# Patient Record
Sex: Female | Born: 1975 | Race: White | Hispanic: No | Marital: Married
Health system: Southern US, Academic
[De-identification: ages and names within clinical notes are randomized; demographics above are authoritative.]

## PROBLEM LIST (undated history)

## (undated) ENCOUNTER — Encounter: Attending: Hematology | Primary: Hematology

## (undated) ENCOUNTER — Encounter: Attending: Nurse Practitioner | Primary: Nurse Practitioner

## (undated) ENCOUNTER — Other Ambulatory Visit

## (undated) ENCOUNTER — Ambulatory Visit

## (undated) ENCOUNTER — Encounter

## (undated) ENCOUNTER — Telehealth

## (undated) ENCOUNTER — Encounter: Attending: Adult Health | Primary: Adult Health

## (undated) ENCOUNTER — Encounter: Attending: Critical Care Medicine | Primary: Critical Care Medicine

## (undated) ENCOUNTER — Ambulatory Visit: Payer: PRIVATE HEALTH INSURANCE

## (undated) ENCOUNTER — Ambulatory Visit: Payer: PRIVATE HEALTH INSURANCE | Attending: Adult Health | Primary: Adult Health

## (undated) ENCOUNTER — Encounter
Attending: Student in an Organized Health Care Education/Training Program | Primary: Student in an Organized Health Care Education/Training Program

## (undated) ENCOUNTER — Ambulatory Visit
Payer: PRIVATE HEALTH INSURANCE | Attending: Student in an Organized Health Care Education/Training Program | Primary: Student in an Organized Health Care Education/Training Program

## (undated) ENCOUNTER — Encounter: Attending: Internal Medicine | Primary: Internal Medicine

## (undated) ENCOUNTER — Encounter: Attending: Pharmacist | Primary: Pharmacist

## (undated) ENCOUNTER — Encounter: Attending: Oncology | Primary: Oncology

## (undated) ENCOUNTER — Telehealth: Attending: Pharmacist | Primary: Pharmacist

## (undated) ENCOUNTER — Ambulatory Visit: Attending: Hematology | Primary: Hematology

## (undated) ENCOUNTER — Ambulatory Visit: Payer: PRIVATE HEALTH INSURANCE | Attending: Infectious Disease | Primary: Infectious Disease

## (undated) ENCOUNTER — Telehealth: Attending: Hematology | Primary: Hematology

## (undated) ENCOUNTER — Encounter: Attending: Registered" | Primary: Registered"

## (undated) ENCOUNTER — Encounter: Attending: Infectious Disease | Primary: Infectious Disease

## (undated) ENCOUNTER — Ambulatory Visit: Payer: Medicaid (Managed Care)

## (undated) ENCOUNTER — Ambulatory Visit: Payer: PRIVATE HEALTH INSURANCE | Attending: Hematology | Primary: Hematology

## (undated) ENCOUNTER — Telehealth
Attending: Pharmacist Clinician (PhC)/ Clinical Pharmacy Specialist | Primary: Pharmacist Clinician (PhC)/ Clinical Pharmacy Specialist

## (undated) ENCOUNTER — Telehealth: Attending: Adult Health | Primary: Adult Health

## (undated) ENCOUNTER — Encounter
Attending: Pharmacist Clinician (PhC)/ Clinical Pharmacy Specialist | Primary: Pharmacist Clinician (PhC)/ Clinical Pharmacy Specialist

## (undated) ENCOUNTER — Ambulatory Visit: Payer: PRIVATE HEALTH INSURANCE | Attending: Ambulatory Care | Primary: Ambulatory Care

## (undated) ENCOUNTER — Inpatient Hospital Stay

## (undated) ENCOUNTER — Telehealth: Attending: Nurse Practitioner | Primary: Nurse Practitioner

## (undated) ENCOUNTER — Encounter: Attending: Ophthalmology | Primary: Ophthalmology

## (undated) ENCOUNTER — Encounter: Attending: Primary Care | Primary: Primary Care

## (undated) DIAGNOSIS — C801 Malignant (primary) neoplasm, unspecified: Secondary | ICD-10-CM

## (undated) DIAGNOSIS — I10 Essential (primary) hypertension: Secondary | ICD-10-CM

## (undated) DIAGNOSIS — R51 Headache: Secondary | ICD-10-CM

## (undated) DIAGNOSIS — F32A Depression, unspecified: Secondary | ICD-10-CM

## (undated) DIAGNOSIS — F329 Major depressive disorder, single episode, unspecified: Secondary | ICD-10-CM

## (undated) DIAGNOSIS — F419 Anxiety disorder, unspecified: Secondary | ICD-10-CM

## (undated) DIAGNOSIS — N200 Calculus of kidney: Secondary | ICD-10-CM

## (undated) HISTORY — PX: LEEP: SHX91

## (undated) HISTORY — PX: KIDNEY STONE SURGERY: SHX686

---
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
## 2010-11-23 ENCOUNTER — Emergency Department (HOSPITAL_COMMUNITY)
Admission: EM | Admit: 2010-11-23 | Discharge: 2010-11-23 | Disposition: A | Discharge status: Home / Self Care | Payer: Self-pay | Attending: Emergency Medicine | Admitting: Emergency Medicine

## 2010-11-23 DIAGNOSIS — R630 Anorexia: Secondary | ICD-10-CM | POA: Insufficient documentation

## 2010-11-23 DIAGNOSIS — N200 Calculus of kidney: Secondary | ICD-10-CM | POA: Insufficient documentation

## 2010-11-23 DIAGNOSIS — R112 Nausea with vomiting, unspecified: Secondary | ICD-10-CM | POA: Insufficient documentation

## 2010-11-23 DIAGNOSIS — R109 Unspecified abdominal pain: Secondary | ICD-10-CM | POA: Insufficient documentation

## 2010-11-23 LAB — POCT I-STAT, CHEM 8
Creatinine, Ser: 1.1 mg/dL (ref 0.4–1.2)
Hemoglobin: 11.9 g/dL — ABNORMAL LOW (ref 12.0–15.0)
Potassium: 4 mEq/L (ref 3.5–5.1)
Sodium: 138 mEq/L (ref 135–145)
TCO2: 25 mmol/L (ref 0–100)

## 2010-11-23 LAB — URINALYSIS, ROUTINE W REFLEX MICROSCOPIC
Bilirubin Urine: NEGATIVE
Ketones, ur: NEGATIVE mg/dL
Nitrite: NEGATIVE
Urobilinogen, UA: 0.2 mg/dL (ref 0.0–1.0)
pH: 5.5 (ref 5.0–8.0)

## 2010-11-23 LAB — URINE MICROSCOPIC-ADD ON

## 2010-11-23 LAB — POCT PREGNANCY, URINE: Preg Test, Ur: NEGATIVE

## 2011-04-11 ENCOUNTER — Inpatient Hospital Stay (INDEPENDENT_AMBULATORY_CARE_PROVIDER_SITE_OTHER)
Admission: RE | Admit: 2011-04-11 | Discharge: 2011-04-11 | Disposition: A | Discharge status: Home / Self Care | Payer: Self-pay | Source: Ambulatory Visit | Attending: Family Medicine | Admitting: Family Medicine

## 2011-04-11 DIAGNOSIS — J019 Acute sinusitis, unspecified: Secondary | ICD-10-CM

## 2011-04-14 ENCOUNTER — Inpatient Hospital Stay (INDEPENDENT_AMBULATORY_CARE_PROVIDER_SITE_OTHER)
Admission: RE | Admit: 2011-04-14 | Discharge: 2011-04-14 | Disposition: A | Discharge status: Home / Self Care | Payer: Self-pay | Source: Ambulatory Visit | Attending: Family Medicine | Admitting: Family Medicine

## 2011-04-14 DIAGNOSIS — J019 Acute sinusitis, unspecified: Secondary | ICD-10-CM

## 2011-04-14 DIAGNOSIS — K2970 Gastritis, unspecified, without bleeding: Secondary | ICD-10-CM

## 2011-04-14 LAB — POCT URINALYSIS DIP (DEVICE)
Bilirubin Urine: NEGATIVE
Glucose, UA: NEGATIVE mg/dL
Ketones, ur: NEGATIVE mg/dL
Nitrite: NEGATIVE
pH: 6 (ref 5.0–8.0)

## 2011-04-14 LAB — POCT PREGNANCY, URINE: Preg Test, Ur: NEGATIVE

## 2011-06-04 ENCOUNTER — Inpatient Hospital Stay (INDEPENDENT_AMBULATORY_CARE_PROVIDER_SITE_OTHER)
Admission: RE | Admit: 2011-06-04 | Discharge: 2011-06-04 | Disposition: A | Discharge status: Home / Self Care | Payer: BC Managed Care – PPO | Source: Ambulatory Visit | Attending: Family Medicine | Admitting: Family Medicine

## 2011-06-04 DIAGNOSIS — J019 Acute sinusitis, unspecified: Secondary | ICD-10-CM

## 2011-06-04 DIAGNOSIS — J040 Acute laryngitis: Secondary | ICD-10-CM

## 2011-07-01 ENCOUNTER — Emergency Department (HOSPITAL_COMMUNITY): Admission: EM | Admit: 2011-07-01 | Discharge: 2011-07-01 | Payer: BC Managed Care – PPO | Source: Home / Self Care

## 2011-09-22 ENCOUNTER — Other Ambulatory Visit (HOSPITAL_COMMUNITY)
Admission: RE | Admit: 2011-09-22 | Discharge: 2011-09-22 | Disposition: A | Discharge status: Home / Self Care | Payer: BC Managed Care – PPO | Source: Ambulatory Visit | Attending: Obstetrics and Gynecology | Admitting: Obstetrics and Gynecology

## 2011-09-22 DIAGNOSIS — Z113 Encounter for screening for infections with a predominantly sexual mode of transmission: Secondary | ICD-10-CM | POA: Insufficient documentation

## 2011-09-22 DIAGNOSIS — Z1159 Encounter for screening for other viral diseases: Secondary | ICD-10-CM | POA: Insufficient documentation

## 2011-09-22 DIAGNOSIS — N760 Acute vaginitis: Secondary | ICD-10-CM | POA: Insufficient documentation

## 2011-09-22 DIAGNOSIS — Z01419 Encounter for gynecological examination (general) (routine) without abnormal findings: Secondary | ICD-10-CM | POA: Insufficient documentation

## 2011-11-09 ENCOUNTER — Emergency Department (HOSPITAL_COMMUNITY): Payer: BC Managed Care – PPO

## 2011-11-09 ENCOUNTER — Emergency Department (HOSPITAL_COMMUNITY)
Admission: EM | Admit: 2011-11-09 | Discharge: 2011-11-09 | Disposition: A | Discharge status: Home / Self Care | Payer: BC Managed Care – PPO | Attending: Emergency Medicine | Admitting: Emergency Medicine

## 2011-11-09 ENCOUNTER — Encounter (HOSPITAL_COMMUNITY): Payer: Self-pay | Admitting: *Deleted

## 2011-11-09 DIAGNOSIS — R109 Unspecified abdominal pain: Secondary | ICD-10-CM | POA: Insufficient documentation

## 2011-11-09 DIAGNOSIS — N200 Calculus of kidney: Secondary | ICD-10-CM | POA: Insufficient documentation

## 2011-11-09 HISTORY — DX: Calculus of kidney: N20.0

## 2011-11-09 LAB — POCT PREGNANCY, URINE: Preg Test, Ur: NEGATIVE

## 2011-11-09 LAB — URINALYSIS, ROUTINE W REFLEX MICROSCOPIC
Nitrite: NEGATIVE
Protein, ur: NEGATIVE mg/dL
Specific Gravity, Urine: 1.027 (ref 1.005–1.030)
Urobilinogen, UA: 0.2 mg/dL (ref 0.0–1.0)

## 2011-11-09 LAB — URINE MICROSCOPIC-ADD ON

## 2011-11-09 MED ORDER — KETOROLAC TROMETHAMINE 60 MG/2ML IM SOLN
60.0000 mg | Freq: Once | INTRAMUSCULAR | Status: AC
  Administered 2011-11-09: 60 mg via INTRAMUSCULAR
  Filled 2011-11-09: qty 2

## 2011-11-09 MED ORDER — OXYCODONE-ACETAMINOPHEN 5-325 MG PO TABS: 2.0000 | ORAL_TABLET | ORAL | Status: AC | PRN

## 2011-11-09 NOTE — Discharge Instructions (Signed)
You have a 9 mm right-sided kidney stone that needs to be followed up by the urologist within the next 24 hours. Call the office to schedule a followup visitKidney Stones Kidney stones (ureteral lithiasis) are deposits that form inside your kidneys. The intense pain is caused by the stone moving through the urinary tract. When the stone moves, the ureter goes into spasm around the stone. The stone is usually passed in the urine.  CAUSES   A disorder that makes certain neck glands produce too much parathyroid hormone (primary hyperparathyroidism).   A buildup of uric acid crystals.   Narrowing (stricture) of the ureter.   A kidney obstruction present at birth (congenital obstruction).   Previous surgery on the kidney or ureters.   Numerous kidney infections.  SYMPTOMS   Feeling sick to your stomach (nauseous).   Throwing up (vomiting).   Blood in the urine (hematuria).   Pain that usually spreads (radiates) to the groin.   Frequency or urgency of urination.  DIAGNOSIS   Taking a history and physical exam.   Blood or urine tests.   Computerized X-ray scan (CT scan).   Occasionally, an examination of the inside of the urinary bladder (cystoscopy) is performed.  TREATMENT   Observation.   Increasing your fluid intake.   Surgery may be needed if you have severe pain or persistent obstruction.  The size, location, and chemical composition are all important variables that will determine the proper choice of action for you. Talk to your caregiver to better understand your situation so that you will minimize the risk of injury to yourself and your kidney.  HOME CARE INSTRUCTIONS   Drink enough water and fluids to keep your urine clear or pale yellow.   Strain all urine through the provided strainer. Keep all particulate matter and stones for your caregiver to see. The stone causing the pain may be as small as a grain of salt. It is very important to use the strainer each and  every time you pass your urine. The collection of your stone will allow your caregiver to analyze it and verify that a stone has actually passed.   Only take over-the-counter or prescription medicines for pain, discomfort, or fever as directed by your caregiver.   Make a follow-up appointment with your caregiver as directed.   Get follow-up X-rays if required. The absence of pain does not always mean that the stone has passed. It may have only stopped moving. If the urine remains completely obstructed, it can cause loss of kidney function or even complete destruction of the kidney. It is your responsibility to make sure X-rays and follow-ups are completed. Ultrasounds of the kidney can show blockages and the status of the kidney. Ultrasounds are not associated with any radiation and can be performed easily in a matter of minutes.  SEEK IMMEDIATE MEDICAL CARE IF:   Pain cannot be controlled with the prescribed medicine.   You have a fever.   The severity or intensity of pain increases over 18 hours and is not relieved by pain medicine.   You develop a new onset of abdominal pain.   You feel faint or pass out.  MAKE SURE YOU:   Understand these instructions.   Will watch your condition.   Will get help right away if you are not doing well or get worse.  Document Released: 07/18/2005 Document Revised: 07/07/2011 Document Reviewed: 11/13/2009 [facility] Patient Information 2012 [place], [place].

## 2011-11-09 NOTE — ED Notes (Signed)
Patient transported to CT 

## 2011-11-09 NOTE — ED Notes (Signed)
Pain currently 0/10.

## 2011-11-09 NOTE — ED Notes (Signed)
Reports right flank pain, radiates around to RLQ and right groin area. Denies urinary symptoms. Hx of kidney stones.

## 2011-11-09 NOTE — ED Provider Notes (Signed)
History     CSN: [id number]  Arrival date & time 11/09/11  1251   First MD Initiated Contact with Patient 11/09/11 1432      Chief Complaint  Patient presents with  . Flank Pain    (Consider location/radiation/quality/duration/timing/severity/associated sxs/prior treatment) Patient is a 36 y.o. female presenting with flank pain. The history is provided by the patient.  Flank Pain   patient here with right-sided flank pain for one week has been colicky. History of kidney stones and this feels similar. No vaginal bleeding. No dysuria or hematuria. Been using over-the-counter medications without relief. Nothing makes her symptoms better or worse.  Past Medical History  Diagnosis Date  . Kidney stone     History reviewed. No pertinent past surgical history.  History reviewed. No pertinent family history.  History  Substance Use Topics  . Smoking status: Former Games developer  . Smokeless tobacco: Not on file  . Alcohol Use: Yes     occ    OB History    Grav Para Term Preterm Abortions TAB SAB Ect Mult Living                  Review of Systems  Genitourinary: Positive for flank pain.  All other systems reviewed and are negative.    Allergies  Sulfa antibiotics and Erythromycin  Home Medications   Current Outpatient Rx  Name Route Sig Dispense Refill  . LEVONORGEST-ETH ESTRAD 91-DAY 0.15-0.03 &0.01 MG PO TABS Oral Take 1 tablet by mouth at bedtime.    Marland Kitchen NAPROXEN SODIUM 550 MG PO TABS Oral Take 550 mg by mouth 2 (two) times daily between meals as needed.      BP 126/87  Pulse 80  Temp(Src) 98.8 F (37.1 C) (Oral)  Resp 18  SpO2 99%  LMP 10/21/2011  Physical Exam  Nursing note and vitals reviewed. Constitutional: She is oriented to person, place, and time. She appears well-developed and well-nourished.  Non-toxic appearance. No distress.  HENT:  Head: Normocephalic and atraumatic.  Eyes: Conjunctivae, EOM and lids are normal. Pupils are equal, round, and  reactive to light.  Neck: Normal range of motion. Neck supple. No tracheal deviation present. No mass present.  Cardiovascular: Normal rate, regular rhythm and normal heart sounds.  Exam reveals no gallop.   No murmur heard. Pulmonary/Chest: Effort normal and breath sounds normal. No stridor. No respiratory distress. She has no decreased breath sounds. She has no wheezes. She has no rhonchi. She has no rales.  Abdominal: Soft. Normal appearance and bowel sounds are normal. She exhibits no distension. There is no tenderness. There is CVA tenderness. There is no rigidity, no rebound and no guarding.  Musculoskeletal: Normal range of motion. She exhibits no edema and no tenderness.  Neurological: She is alert and oriented to person, place, and time. She has normal strength. No cranial nerve deficit or sensory deficit. GCS eye subscore is 4. GCS verbal subscore is 5. GCS motor subscore is 6.  Skin: Skin is warm and dry. No abrasion and no rash noted.  Psychiatric: She has a normal mood and affect. Her speech is normal and behavior is normal.    ED Course  Procedures (including critical care time)   Labs Reviewed  POCT PREGNANCY, URINE  URINALYSIS, ROUTINE W REFLEX MICROSCOPIC   No results found.   No diagnosis found.    MDM  Patient given Toradol here and feels much better. CT results given to patient and she was given a referral to the  urologist on call        Toy Baker, MD 11/09/11 1555

## 2011-11-11 ENCOUNTER — Other Ambulatory Visit: Payer: Self-pay | Admitting: Urology

## 2011-11-21 ENCOUNTER — Encounter (HOSPITAL_BASED_OUTPATIENT_CLINIC_OR_DEPARTMENT_OTHER): Payer: Self-pay | Admitting: *Deleted

## 2011-11-21 NOTE — Progress Notes (Signed)
To [facility] at 0800.Npo after mn-Hg,urine pregnancy on arrival.

## 2011-11-22 NOTE — H&P (Signed)
  H&P  Chief Complaint: kidney stone  History of Present Illness: Kathryn Black is a 36 y.o. year old female with the following history:   This 36 year old female comes in today for definitive management of a right distal ureteral stone pre-she has a history of several calculi, having been passed from the right side of the past several years. For a few weeks, she has had intermittent right flank pain as well as frequency and urgency as well as dysuria. Symptoms got worse couple of days ago and she had a CT scan which revealed an 8 mm right distal ureteral stone. She has no gross hematuria, she has no nausea or vomiting and no history of fever or chills. She has passed several stones before but has never seen a neurologist or had a 24-hour urine collection. She is having no left-sided symptoms.she is here today for ureteroscopic stone extraction.   Past Medical History  Diagnosis Date  . Kidney stone   . Headache   . Asthma     childhood-allergy induced  . Anxiety     no meds  . Depression     no meds    Past Surgical History  Procedure Date  . Leep     in office procedure  . Breast biopsy 1990's    in office procedure under local    Home Medications:  No prescriptions prior to admission    Allergies:  Allergies  Allergen Reactions  . Sulfa Antibiotics Anaphylaxis  . Erythromycin Hives    History reviewed. No pertinent family history.  Social History:  reports that she has quit smoking. She does not have any smokeless tobacco history on file. She reports that she drinks alcohol. She reports that she does not use illicit drugs.   Review of Systems Genitourinary, constitutional, skin, eye, otolaryngeal, hematologic/lymphatic, cardiovascular, pulmonary, endocrine, musculoskeletal, gastrointestinal, neurological and psychiatric system(s) were reviewed and pertinent findings if present are noted.  Genitourinary: urinary frequency, urinary urgency and dysuria, but no hematuria.    Gastrointestinal: abdominal pain and constipation.  Constitutional: night sweats and feeling tired (fatigue).  Eyes: blurred vision.  ENT: sore throat and sinus problems.  Cardiovascular: chest pain.  Respiratory: shortness of breath.  Musculoskeletal: back pain.  Neurological: dizziness.  Psychiatric: anxiety.      Physical Exam:  Vital signs in last 24 hours:   Constitutional: Well nourished and well developed . No acute distress.  ENT:. The ears and nose are normal in appearance.  Neck: The appearance of the neck is normal.  Pulmonary: No respiratory distress and normal respiratory rhythm and effort.  Skin: Normal skin turgor, no visible rash and no visible skin lesions.  Neuro/Psych:. Mood and affect are appropriate.      Laboratory Data:  No results found for this or any previous visit (from the past 24 hour(s)). No results found for this or any previous visit (from the past 240 hour(s)). Creatinine: No results found for this basename: CREATININE:7 in the last 168 hours  Radiologic Imaging: No results found.  Impression/Assessment:  7.7 mm right distal ureteral stone  Plan:  Cystoscopy, right ureteroscopy with holmium laser and extraction of right ureteral stone  Chelsea Aus 11/22/2011, 5:33 PM  Bertram Millard. Lean Jaeger MD

## 2011-11-23 ENCOUNTER — Encounter (HOSPITAL_BASED_OUTPATIENT_CLINIC_OR_DEPARTMENT_OTHER): Payer: Self-pay | Admitting: Certified Registered"

## 2011-11-23 ENCOUNTER — Encounter (HOSPITAL_BASED_OUTPATIENT_CLINIC_OR_DEPARTMENT_OTHER)
Admission: RE | Disposition: A | Discharge status: Home / Self Care | Payer: Self-pay | Source: Ambulatory Visit | Attending: Urology

## 2011-11-23 ENCOUNTER — Ambulatory Visit (HOSPITAL_BASED_OUTPATIENT_CLINIC_OR_DEPARTMENT_OTHER)
Admission: RE | Admit: 2011-11-23 | Discharge: 2011-11-23 | Disposition: A | Discharge status: Home / Self Care | Payer: BC Managed Care – PPO | Source: Ambulatory Visit | Attending: Urology | Admitting: Urology

## 2011-11-23 ENCOUNTER — Encounter (HOSPITAL_BASED_OUTPATIENT_CLINIC_OR_DEPARTMENT_OTHER): Payer: Self-pay | Admitting: *Deleted

## 2011-11-23 ENCOUNTER — Ambulatory Visit (HOSPITAL_BASED_OUTPATIENT_CLINIC_OR_DEPARTMENT_OTHER): Payer: BC Managed Care – PPO | Admitting: Certified Registered"

## 2011-11-23 DIAGNOSIS — N201 Calculus of ureter: Secondary | ICD-10-CM

## 2011-11-23 HISTORY — DX: Depression, unspecified: F32.A

## 2011-11-23 HISTORY — DX: Anxiety disorder, unspecified: F41.9

## 2011-11-23 HISTORY — DX: Headache: R51

## 2011-11-23 HISTORY — PX: CYSTOSCOPY WITH URETEROSCOPY: SHX5123

## 2011-11-23 HISTORY — DX: Major depressive disorder, single episode, unspecified: F32.9

## 2011-11-23 LAB — POCT PREGNANCY, URINE: Preg Test, Ur: NEGATIVE

## 2011-11-23 SURGERY — CYSTOSCOPY WITH URETEROSCOPY
Anesthesia: General | Site: Ureter | Laterality: Right | Wound class: Clean Contaminated

## 2011-11-23 MED ORDER — SODIUM CHLORIDE 0.9 % IR SOLN
Status: DC | PRN
  Administered 2011-11-23: 6000 mL

## 2011-11-23 MED ORDER — IOHEXOL 350 MG/ML SOLN
INTRAVENOUS | Status: DC | PRN
  Administered 2011-11-23: 50 mL via INTRAVENOUS

## 2011-11-23 MED ORDER — HYDROMORPHONE HCL PF 1 MG/ML IJ SOLN: 0.2500 mg | INTRAMUSCULAR | Status: DC | PRN

## 2011-11-23 MED ORDER — ACETAMINOPHEN 10 MG/ML IV SOLN
1000.0000 mg | Freq: Four times a day (QID) | INTRAVENOUS | Status: DC
  Administered 2011-11-23: 1000 mg via INTRAVENOUS

## 2011-11-23 MED ORDER — OXYBUTYNIN CHLORIDE 5 MG PO TABS: 5.0000 mg | ORAL_TABLET | Freq: Three times a day (TID) | ORAL | Status: DC

## 2011-11-23 MED ORDER — BELLADONNA ALKALOIDS-OPIUM 16.2-60 MG RE SUPP
RECTAL | Status: DC | PRN
  Administered 2011-11-23: 1 via RECTAL

## 2011-11-23 MED ORDER — FENTANYL CITRATE 0.05 MG/ML IJ SOLN
INTRAMUSCULAR | Status: DC | PRN
  Administered 2011-11-23: 100 ug via INTRAVENOUS

## 2011-11-23 MED ORDER — DEXAMETHASONE SODIUM PHOSPHATE 4 MG/ML IJ SOLN
INTRAMUSCULAR | Status: DC | PRN
  Administered 2011-11-23: 8 mg via INTRAVENOUS

## 2011-11-23 MED ORDER — OXYBUTYNIN CHLORIDE 5 MG PO TABS
5.0000 mg | ORAL_TABLET | Freq: Three times a day (TID) | ORAL | Status: DC
  Administered 2011-11-23: 5 mg via ORAL

## 2011-11-23 MED ORDER — CEFAZOLIN SODIUM 1-5 GM-% IV SOLN
1.0000 g | INTRAVENOUS | Status: AC
  Administered 2011-11-23: 1 g via INTRAVENOUS

## 2011-11-23 MED ORDER — LIDOCAINE HCL (CARDIAC) 20 MG/ML IV SOLN
INTRAVENOUS | Status: DC | PRN
  Administered 2011-11-23: 50 mg via INTRAVENOUS

## 2011-11-23 MED ORDER — MEPERIDINE HCL 25 MG/ML IJ SOLN: 6.2500 mg | INTRAMUSCULAR | Status: DC | PRN

## 2011-11-23 MED ORDER — LACTATED RINGERS IV SOLN: INTRAVENOUS | Status: DC

## 2011-11-23 MED ORDER — PROMETHAZINE HCL 25 MG/ML IJ SOLN: 6.2500 mg | INTRAMUSCULAR | Status: DC | PRN

## 2011-11-23 MED ORDER — LACTATED RINGERS IV SOLN
INTRAVENOUS | Status: DC
  Administered 2011-11-23: 08:00:00 via INTRAVENOUS

## 2011-11-23 MED ORDER — PROPOFOL 10 MG/ML IV EMUL
INTRAVENOUS | Status: DC | PRN
  Administered 2011-11-23: 200 mg via INTRAVENOUS

## 2011-11-23 MED ORDER — ONDANSETRON HCL 4 MG/2ML IJ SOLN
INTRAMUSCULAR | Status: DC | PRN
  Administered 2011-11-23: 4 mg via INTRAVENOUS

## 2011-11-23 MED ORDER — CIPROFLOXACIN HCL 250 MG PO TABS: 250.0000 mg | ORAL_TABLET | Freq: Two times a day (BID) | ORAL | Status: AC

## 2011-11-23 MED ORDER — MIDAZOLAM HCL 5 MG/5ML IJ SOLN
INTRAMUSCULAR | Status: DC | PRN
  Administered 2011-11-23: 2 mg via INTRAVENOUS

## 2011-11-23 SURGICAL SUPPLY — 20 items
ADAPTER CATH URET PLST 4-6FR (CATHETERS) IMPLANT
BAG DRAIN URO-CYSTO SKYTR STRL (DRAIN) ×3 IMPLANT
BASKET ZERO TIP NITINOL 2.4FR (BASKET) ×3 IMPLANT
CANISTER SUCT LVC 12 LTR MEDI- (MISCELLANEOUS) ×3 IMPLANT
CATH INTERMIT  6FR 70CM (CATHETERS) IMPLANT
CLOTH BEACON ORANGE TIMEOUT ST (SAFETY) ×3 IMPLANT
DRAPE CAMERA CLOSED 9X96 (DRAPES) ×3 IMPLANT
GLOVE BIO SURGEON STRL SZ8 (GLOVE) ×3 IMPLANT
GLOVE INDICATOR 6.5 STRL GRN (GLOVE) ×6 IMPLANT
GOWN STRL REIN XL XLG (GOWN DISPOSABLE) ×3 IMPLANT
GOWN SURGICAL LARGE (GOWNS) ×3 IMPLANT
GOWN XL W/COTTON TOWEL STD (GOWNS) ×3 IMPLANT
GUIDEWIRE 0.038 PTFE COATED (WIRE) IMPLANT
GUIDEWIRE ANG ZIPWIRE 038X150 (WIRE) IMPLANT
GUIDEWIRE STR DUAL SENSOR (WIRE) ×3 IMPLANT
IV NS IRRIG 3000ML ARTHROMATIC (IV SOLUTION) ×6 IMPLANT
LASER FIBER DISP (UROLOGICAL SUPPLIES) ×3 IMPLANT
NS IRRIG 500ML POUR BTL (IV SOLUTION) IMPLANT
PACK CYSTOSCOPY (CUSTOM PROCEDURE TRAY) ×3 IMPLANT
SYRINGE IRR TOOMEY STRL 70CC (SYRINGE) ×3 IMPLANT

## 2011-11-23 NOTE — Anesthesia Preprocedure Evaluation (Signed)
Anesthesia Evaluation  Patient identified by MRN, date of birth, ID band Patient awake    Reviewed: Allergy & Precautions, H&P , NPO status , Patient's Chart, lab work & pertinent test results  Airway Mallampati: II TM Distance: >3 FB Neck ROM: Full    Dental No notable dental hx.    Pulmonary neg pulmonary ROS, asthma ,  breath sounds clear to auscultation  Pulmonary exam normal       Cardiovascular negative cardio ROS  Rhythm:Regular Rate:Normal     Neuro/Psych negative neurological ROS  negative psych ROS   GI/Hepatic negative GI ROS, Neg liver ROS,   Endo/Other  negative endocrine ROS  Renal/GU negative Renal ROS  negative genitourinary   Musculoskeletal negative musculoskeletal ROS (+)   Abdominal   Peds negative pediatric ROS (+)  Hematology negative hematology ROS (+)   Anesthesia Other Findings   Reproductive/Obstetrics negative OB ROS                           Anesthesia Physical Anesthesia Plan  ASA: II  Anesthesia Plan: General   Post-op Pain Management:    Induction: Intravenous  Airway Management Planned: LMA  Additional Equipment:   Intra-op Plan:   Post-operative Plan:   Informed Consent: I have reviewed the patients History and Physical, chart, labs and discussed the procedure including the risks, benefits and alternatives for the proposed anesthesia with the patient or authorized representative who has indicated his/her understanding and acceptance.   Dental advisory given  Plan Discussed with: CRNA  Anesthesia Plan Comments:         Anesthesia Quick Evaluation

## 2011-11-23 NOTE — Op Note (Signed)
Preoperative diagnosis: 7 mm right distal ureteral stone  Postoperative diagnosis: Same  Principal procedure: Cystoscopy, right ureteroscopy with holmium laser and extraction of stone  Surgeon: Junell Cullifer  Anesthesia: Gen. with LMA  Complications: None  Drains: None  Specimen: Stone fragments, the family  Indications for procedure: 36 year old female with a symptomatic, persistent right distal ureteral stone, approximately 7-8 mm in size. More than likely, this has been there for months. At this point, she presents for management. I discussed treatment options with her, and have recommended a more direct route with ureteroscopy, due to her age and the location of the stone. Risks and complications have been discussed with the patient who understands these and desires to proceed.  Description of procedure: The patient was identified and properly marked in the holding area, and received preoperative IV antibiotics. She was taken to the operating room where general anesthetic was administered using the LMA. She was placed in the dorsolithotomy position. Genitalia and perineum were prepped and draped. Time out was then performed.  A 22 French panendoscope was advanced into her bladder which was normal except for some erythema around the right ureteral orifice, which was indicative of a indwelling stone. A guidewire was placed proximally in the right ureter using fluoroscopic guidance. At this point, a 6 [place] short ureteroscope was advanced into the right distal ureter, where the stone was encountered. It was fragmented into multiple small fragments using the 365  fiber and the holmium laser. Small fragments were then removed and placed in the bladder. Further inspection of the ureter with the ureteroscope revealed no further stones, no significant ureteral trauma. I did not leave a stent. The bladder was irrigated and the stone fragments removed. The bladder was then drained, the scope removed, and  the procedure terminated. She received a B&O suppository prior to procedure. She tolerated the procedure well and was taken to the PACU in stable condition.

## 2011-11-23 NOTE — Transfer of Care (Signed)
Immediate Anesthesia Transfer of Care Note  Patient: Kathryn Black  Procedure(s) Performed: Procedure(s) (LRB): CYSTOSCOPY WITH URETEROSCOPY (Right) HOLMIUM LASER APPLICATION (Right)  Patient Location: PACU  Anesthesia Type: General  Level of Consciousness: sedated  Airway & Oxygen Therapy: Patient Spontanous Breathing and Patient connected to face mask oxygen  Post-op Assessment: Report given to PACU RN and Post -op Vital signs reviewed and stable  Post vital signs: Reviewed and stable  Complications: No apparent anesthesia complications

## 2011-11-23 NOTE — Discharge Instructions (Signed)
POSTOPERATIVE CARE AFTER URETEROSCOPY ° ° ° °Diet ° °Once you have adequately recovered from anesthesia, you may gradually advance your diet, as tolerated, to your regular diet. ° °Activities ° °You may gradually increase your activities to your normal unrestricted level the day following your procedure. ° °Medications ° °You should resume all preoperative medications. If you are on aspirin-like compounds, you should not resume these until the blood clears from your urine. If given an antibiotic by the surgeon, take these until they are completed. You may also be given, if you have a stent, medications to decrease the urinary frequency and urgency. ° °Pain ° °After ureteroscopy, there may be some pain on the side of the scope. Take your pain medicine for this. Usually, this pain resolves within a day or 2. ° °Fever ° °Please report any fever over 100° to the doctor. ° °Post Anesthesia Home Care Instructions ° °Activity: °Get plenty of rest for the remainder of the day. A responsible adult should stay with you for 24 hours following the procedure.  °For the next 24 hours, DO NOT: °-Drive a car °-Operate machinery °-Drink alcoholic beverages °-Take any medication unless instructed by your physician °-Make any legal decisions or sign important papers. ° °Meals: °Start with liquid foods such as gelatin or soup. Progress to regular foods as tolerated. Avoid greasy, spicy, heavy foods. If nausea and/or vomiting occur, drink only clear liquids until the nausea and/or vomiting subsides. Call your physician if vomiting continues. ° °Special Instructions/Symptoms: °Your throat may feel dry or sore from the anesthesia or the breathing tube placed in your throat during surgery. If this causes discomfort, gargle with warm salt water. The discomfort should disappear within 24 hours. ° °

## 2011-11-23 NOTE — Anesthesia Postprocedure Evaluation (Signed)
  Anesthesia Post-op Note  Patient: Kathryn Black  Procedure(s) Performed: Procedure(s) (LRB): CYSTOSCOPY WITH URETEROSCOPY (Right) HOLMIUM LASER APPLICATION (Right)  Patient Location: PACU  Anesthesia Type: General  Level of Consciousness: awake and alert   Airway and Oxygen Therapy: Patient Spontanous Breathing  Post-op Pain: mild  Post-op Assessment: Post-op Vital signs reviewed, Patient's Cardiovascular Status Stable, Respiratory Function Stable, Patent Airway and No signs of Nausea or vomiting  Post-op Vital Signs: stable  Complications: No apparent anesthesia complications

## 2011-11-24 ENCOUNTER — Encounter (HOSPITAL_BASED_OUTPATIENT_CLINIC_OR_DEPARTMENT_OTHER): Payer: Self-pay | Admitting: Urology

## 2011-11-28 ENCOUNTER — Encounter (HOSPITAL_BASED_OUTPATIENT_CLINIC_OR_DEPARTMENT_OTHER): Payer: Self-pay

## 2013-01-24 ENCOUNTER — Other Ambulatory Visit (HOSPITAL_COMMUNITY)
Admission: RE | Admit: 2013-01-24 | Discharge: 2013-01-24 | Disposition: A | Discharge status: Home / Self Care | Payer: BC Managed Care – PPO | Source: Ambulatory Visit | Attending: Obstetrics and Gynecology | Admitting: Obstetrics and Gynecology

## 2013-01-24 ENCOUNTER — Other Ambulatory Visit: Payer: Self-pay | Admitting: Obstetrics and Gynecology

## 2013-01-24 DIAGNOSIS — N760 Acute vaginitis: Secondary | ICD-10-CM | POA: Insufficient documentation

## 2013-01-24 DIAGNOSIS — Z01419 Encounter for gynecological examination (general) (routine) without abnormal findings: Secondary | ICD-10-CM | POA: Insufficient documentation

## 2013-01-24 DIAGNOSIS — Z1151 Encounter for screening for human papillomavirus (HPV): Secondary | ICD-10-CM | POA: Insufficient documentation

## 2016-01-21 ENCOUNTER — Emergency Department (HOSPITAL_COMMUNITY): Payer: 59

## 2016-01-21 ENCOUNTER — Emergency Department (HOSPITAL_COMMUNITY)
Admission: EM | Admit: 2016-01-21 | Discharge: 2016-01-21 | Disposition: A | Discharge status: Home / Self Care | Payer: 59 | Attending: Emergency Medicine | Admitting: Emergency Medicine

## 2016-01-21 ENCOUNTER — Encounter (HOSPITAL_COMMUNITY): Payer: Self-pay

## 2016-01-21 DIAGNOSIS — J45909 Unspecified asthma, uncomplicated: Secondary | ICD-10-CM | POA: Diagnosis not present

## 2016-01-21 DIAGNOSIS — Z87891 Personal history of nicotine dependence: Secondary | ICD-10-CM | POA: Insufficient documentation

## 2016-01-21 DIAGNOSIS — R1031 Right lower quadrant pain: Secondary | ICD-10-CM | POA: Diagnosis present

## 2016-01-21 DIAGNOSIS — N1330 Unspecified hydronephrosis: Secondary | ICD-10-CM | POA: Insufficient documentation

## 2016-01-21 DIAGNOSIS — N201 Calculus of ureter: Secondary | ICD-10-CM | POA: Insufficient documentation

## 2016-01-21 DIAGNOSIS — N23 Unspecified renal colic: Secondary | ICD-10-CM

## 2016-01-21 DIAGNOSIS — F329 Major depressive disorder, single episode, unspecified: Secondary | ICD-10-CM | POA: Insufficient documentation

## 2016-01-21 LAB — URINALYSIS, ROUTINE W REFLEX MICROSCOPIC
BILIRUBIN URINE: NEGATIVE
Glucose, UA: NEGATIVE mg/dL
HGB URINE DIPSTICK: NEGATIVE
Ketones, ur: NEGATIVE mg/dL
Leukocytes, UA: NEGATIVE
NITRITE: NEGATIVE
PROTEIN: NEGATIVE mg/dL
SPECIFIC GRAVITY, URINE: 1.016 (ref 1.005–1.030)
pH: 8 (ref 5.0–8.0)

## 2016-01-21 LAB — CBC
HCT: 43.1 % (ref 36.0–46.0)
Hemoglobin: 15.7 g/dL — ABNORMAL HIGH (ref 12.0–15.0)
MCH: 32.8 pg (ref 26.0–34.0)
MCHC: 36.4 g/dL — AB (ref 30.0–36.0)
MCV: 90.2 fL (ref 78.0–100.0)
PLATELETS: 317 10*3/uL (ref 150–400)
RBC: 4.78 MIL/uL (ref 3.87–5.11)
RDW: 12.1 % (ref 11.5–15.5)
WBC: 11.7 10*3/uL — AB (ref 4.0–10.5)

## 2016-01-21 LAB — COMPREHENSIVE METABOLIC PANEL
ALT: 55 U/L — AB (ref 14–54)
AST: 33 U/L (ref 15–41)
Albumin: 4.6 g/dL (ref 3.5–5.0)
Alkaline Phosphatase: 59 U/L (ref 38–126)
Anion gap: 11 (ref 5–15)
BILIRUBIN TOTAL: 0.8 mg/dL (ref 0.3–1.2)
BUN: 19 mg/dL (ref 6–20)
CO2: 24 mmol/L (ref 22–32)
CREATININE: 0.82 mg/dL (ref 0.44–1.00)
Calcium: 9.5 mg/dL (ref 8.9–10.3)
Chloride: 101 mmol/L (ref 101–111)
GFR calc Af Amer: >60 mL/min (ref >60)
Glucose, Bld: 134 mg/dL — ABNORMAL HIGH (ref 65–99)
Potassium: 3.6 mmol/L (ref 3.5–5.1)
Sodium: 136 mmol/L (ref 135–145)
TOTAL PROTEIN: 8 g/dL (ref 6.5–8.1)

## 2016-01-21 LAB — PREGNANCY, URINE: PREG TEST UR: NEGATIVE

## 2016-01-21 LAB — LIPASE, BLOOD: Lipase: 81 U/L — ABNORMAL HIGH (ref 11–51)

## 2016-01-21 MED ORDER — ONDANSETRON 8 MG PO TBDP: 8.0000 mg | ORAL_TABLET | Freq: Three times a day (TID) | ORAL | Status: DC | PRN

## 2016-01-21 MED ORDER — HYDROMORPHONE HCL 1 MG/ML IJ SOLN
1.0000 mg | Freq: Once | INTRAMUSCULAR | Status: AC
  Administered 2016-01-21: 1 mg via INTRAVENOUS
  Filled 2016-01-21: qty 1

## 2016-01-21 MED ORDER — KETOROLAC TROMETHAMINE 15 MG/ML IJ SOLN
15.0000 mg | Freq: Once | INTRAMUSCULAR | Status: AC
  Administered 2016-01-21: 15 mg via INTRAVENOUS
  Filled 2016-01-21: qty 1

## 2016-01-21 MED ORDER — IOPAMIDOL (ISOVUE-300) INJECTION 61%
100.0000 mL | Freq: Once | INTRAVENOUS | Status: AC | PRN
  Administered 2016-01-21: 100 mL via INTRAVENOUS

## 2016-01-21 MED ORDER — IBUPROFEN 400 MG PO TABS: 400.0000 mg | ORAL_TABLET | Freq: Four times a day (QID) | ORAL | Status: DC | PRN

## 2016-01-21 MED ORDER — ONDANSETRON HCL 4 MG/2ML IJ SOLN
4.0000 mg | Freq: Once | INTRAMUSCULAR | Status: AC
  Administered 2016-01-21: 4 mg via INTRAVENOUS
  Filled 2016-01-21: qty 2

## 2016-01-21 MED ORDER — HYDROCODONE-ACETAMINOPHEN 5-325 MG PO TABS: 1.0000 | ORAL_TABLET | Freq: Four times a day (QID) | ORAL | Status: DC | PRN

## 2016-01-21 NOTE — ED Provider Notes (Addendum)
CSN: [id number]     Arrival date & time 01/21/16  [id number] History   First MD Initiated Contact with Patient 01/21/16 [phone]     Chief Complaint  Patient presents with  . Abdominal Pain     (Consider location/radiation/quality/duration/timing/severity/associated sxs/prior Treatment) HPI Comments: Pt comes in with RLQ abd pain x 2 hours. Pt has hx of renal stones x 2, one of which required intervention. She reports pain in the R side that has been severe since middle of the night. The pain is constant and severe, sharp pain. Pain is non radiating. She has nausea and emesis. No diarrhea. No uti like symptoms. Pain is different than her renal stones. Labs show elevated lipase. Pt denies any alcohol abuse or gall stone hx.   ROS 10 Systems reviewed and are negative for acute change except as noted in the HPI.     Patient is a 40 y.o. female presenting with abdominal pain. The history is provided by the patient.  Abdominal Pain   Past Medical History  Diagnosis Date  . Kidney stone   . Headache(784.0)   . Asthma     childhood-allergy induced  . Anxiety     no meds  . Depression     no meds   Past Surgical History  Procedure Laterality Date  . Leep      in office procedure  . Breast biopsy  1990's    in office procedure under local  . Cystoscopy with ureteroscopy  11/23/2011    Procedure: CYSTOSCOPY WITH URETEROSCOPY;  Surgeon: Franchot Gallo, MD;  Location: [facility];  Service: Urology;  Laterality: Right;  WITH STONE obtained   History reviewed. No pertinent family history. Social History  Substance Use Topics  . Smoking status: Former Smoker -- 0.25 packs/day  . Smokeless tobacco: None  . Alcohol Use: Yes     Comment: occ   OB History    No data available     Review of Systems  Gastrointestinal: Positive for abdominal pain.      Allergies  Sulfa antibiotics and Erythromycin  Home Medications   Prior to Admission medications   Medication  Sig Start Date End Date Taking? Authorizing Provider  escitalopram (LEXAPRO) 10 MG tablet Take 10 mg by mouth daily.   Yes Historical Provider, MD  naproxen sodium (ANAPROX) 550 MG tablet Take 550 mg by mouth 2 (two) times daily between meals as needed.   Yes Historical Provider, MD  HYDROcodone-acetaminophen (NORCO/VICODIN) 5-325 MG tablet Take 1 tablet by mouth every 6 (six) hours as needed. 01/21/16   Varney Biles, MD  ibuprofen (ADVIL,MOTRIN) 400 MG tablet Take 1 tablet (400 mg total) by mouth every 6 (six) hours as needed. 01/21/16   Varney Biles, MD  ondansetron (ZOFRAN ODT) 8 MG disintegrating tablet Take 1 tablet (8 mg total) by mouth every 8 (eight) hours as needed for nausea. 01/21/16   Varney Biles, MD  oxybutynin (DITROPAN) 5 MG tablet Take 1 tablet (5 mg total) by mouth 3 (three) times daily. 11/23/11 11/22/12  Franchot Gallo, MD   BP 156/103 mmHg  Pulse 77  Temp(Src) 97.9 F (36.6 C) (Oral)  Resp 20  SpO2 95%  LMP 01/07/2016 Physical Exam  Constitutional: She is oriented to person, place, and time. She appears well-developed.  HENT:  Head: Normocephalic and atraumatic.  Eyes: EOM are normal.  Neck: Normal range of motion. Neck supple.  Cardiovascular: Normal rate.   Pulmonary/Chest: Effort normal.  Abdominal: Bowel sounds are normal.  There is tenderness. There is guarding.  RLQ and RUQ tenderness  Neurological: She is alert and oriented to person, place, and time.  Skin: Skin is warm and dry.  Nursing note and vitals reviewed.   ED Course  Procedures (including critical care time) Labs Review Labs Reviewed  LIPASE, BLOOD - Abnormal; Notable for the following:    Lipase 81 (*)    All other components within normal limits  COMPREHENSIVE METABOLIC PANEL - Abnormal; Notable for the following:    Glucose, Bld 134 (*)    ALT 55 (*)    All other components within normal limits  CBC - Abnormal; Notable for the following:    WBC 11.7 (*)    Hemoglobin 15.7 (*)     MCHC 36.4 (*)    All other components within normal limits  URINALYSIS, ROUTINE W REFLEX MICROSCOPIC (NOT AT [facility]) - Abnormal; Notable for the following:    APPearance CLOUDY (*)    All other components within normal limits  PREGNANCY, URINE    Imaging Review Ct Abdomen Pelvis W Contrast  01/21/2016  CLINICAL DATA:  40 year old female with right lower quadrant abdominal pain. EXAM: CT ABDOMEN AND PELVIS WITH CONTRAST TECHNIQUE: Multidetector CT imaging of the abdomen and pelvis was performed using the standard protocol following bolus administration of intravenous contrast. CONTRAST:  152mL ISOVUE-300 IOPAMIDOL (ISOVUE-300) INJECTION 61% COMPARISON:  CT dated 11/09/2011 FINDINGS: The visualized lung bases are clear. No intra-abdominal free air or free fluid. The liver, gallbladder, pancreas, spleen, adrenal glands appear unremarkable. There is a 7 mm right ureterovesical junction calculus with mild right hydronephroureter. There is small right perinephric fluid. Correlation with urinalysis recommended to exclude superimposed UTI. Multiple punctate nonobstructing left renal calculi noted. There is no hydronephrosis on the left. The urinary bladder is unremarkable. The uterus is anteverted and grossly unremarkable. The visualized ovaries are grossly unremarkable. Evaluation of the bowel is limited in the absence of oral contrast. There is no evidence of bowel obstruction or active inflammation. There is apparent diffuse thickening of the colon, likely related to underdistention. Normal appendix. The abdominal aorta and IVC appear unremarkable. No portal venous gas identified. There is no adenopathy. Small fat containing umbilical hernia. The abdominal wall soft tissues appear unremarkable. The osseous structures are intact. IMPRESSION: A 7 mm right UVJ stone with mild right hydronephrosis. Correlation with urinalysis recommended to exclude superimposed UTI. Punctate nonobstructing left renal calculi.  Electronically Signed   By: Anner Crete M.D.   On: 01/21/2016 06:26   I have personally reviewed and evaluated these images and lab results as part of my medical decision-making.   EKG Interpretation None      MDM   Final diagnoses:  Ureteral colic    Pt comes in with cc of R sided pain. Lipase is elevated. DDX Renal stone Appendicitis Ectopic preg Cholecystitis Perforated viscus  CT is showing ureteral colic. Pain improved post 1st round of medicine, but it is returning again. Will give toradol.   Varney Biles, MD 01/21/16 0651  7:23 AM Pain is now 2/10, tolerable, she is smiling. Results discussed. Return precautions discussed. Stone is pretty large, so i wouldn't be surprised if she has to return for pain.  Varney Biles, MD 01/21/16 [phone]

## 2016-01-21 NOTE — Discharge Instructions (Signed)
We saw you in the ER for the abdominal pain. Our results indicate that you have a kidney stone. We were able to get your pain is relative control, and we can safely send you home.  Take the meds prescribed. Set up an appointment with the Urologist. If the pain is unbearable, you start having fevers, chills, and are unable to keep any meds down - then return to the ER.    Kidney Stones Kidney stones (urolithiasis) are deposits that form inside your kidneys. The intense pain is caused by the stone moving through the urinary tract. When the stone moves, the ureter goes into spasm around the stone. The stone is usually passed in the urine.  CAUSES   A disorder that makes certain neck glands produce too much parathyroid hormone (primary hyperparathyroidism).  A buildup of uric acid crystals, similar to gout in your joints.  Narrowing (stricture) of the ureter.  A kidney obstruction present at birth (congenital obstruction).  Previous surgery on the kidney or ureters.  Numerous kidney infections. SYMPTOMS   Feeling sick to your stomach (nauseous).  Throwing up (vomiting).  Blood in the urine (hematuria).  Pain that usually spreads (radiates) to the groin.  Frequency or urgency of urination. DIAGNOSIS   Taking a history and physical exam.  Blood or urine tests.  CT scan.  Occasionally, an examination of the inside of the urinary bladder (cystoscopy) is performed. TREATMENT   Observation.  Increasing your fluid intake.  Extracorporeal shock wave lithotripsy--This is a noninvasive procedure that uses shock waves to break up kidney stones.  Surgery may be needed if you have severe pain or persistent obstruction. There are various surgical procedures. Most of the procedures are performed with the use of small instruments. Only small incisions are needed to accommodate these instruments, so recovery time is minimized. The size, location, and chemical composition are all  important variables that will determine the proper choice of action for you. Talk to your health care provider to better understand your situation so that you will minimize the risk of injury to yourself and your kidney.  HOME CARE INSTRUCTIONS   Drink enough water and fluids to keep your urine clear or pale yellow. This will help you to pass the stone or stone fragments.  Strain all urine through the provided strainer. Keep all particulate matter and stones for your health care provider to see. The stone causing the pain may be as small as a grain of salt. It is very important to use the strainer each and every time you pass your urine. The collection of your stone will allow your health care provider to analyze it and verify that a stone has actually passed. The stone analysis will often identify what you can do to reduce the incidence of recurrences.  Only take over-the-counter or prescription medicines for pain, discomfort, or fever as directed by your health care provider.  Keep all follow-up visits as told by your health care provider. This is important.  Get follow-up X-rays if required. The absence of pain does not always mean that the stone has passed. It may have only stopped moving. If the urine remains completely obstructed, it can cause loss of kidney function or even complete destruction of the kidney. It is your responsibility to make sure X-rays and follow-ups are completed. Ultrasounds of the kidney can show blockages and the status of the kidney. Ultrasounds are not associated with any radiation and can be performed easily in a matter of minutes.  Make changes to your daily diet as told by your health care provider. You may be told to:  Limit the amount of salt that you eat.  Eat 5 or more servings of fruits and vegetables each day.  Limit the amount of meat, poultry, fish, and eggs that you eat.  Collect a 24-hour urine sample as told by your health care provider.You may need  to collect another urine sample every 6-12 months. SEEK MEDICAL CARE IF:  You experience pain that is progressive and unresponsive to any pain medicine you have been prescribed. SEEK IMMEDIATE MEDICAL CARE IF:   Pain cannot be controlled with the prescribed medicine.  You have a fever or shaking chills.  The severity or intensity of pain increases over 18 hours and is not relieved by pain medicine.  You develop a new onset of abdominal pain.  You feel faint or pass out.  You are unable to urinate.   This information is not intended to replace advice given to you by your health care provider. Make sure you discuss any questions you have with your health care provider.   Document Released: 07/18/2005 Document Revised: 04/08/2015 Document Reviewed: 12/19/2012 Elsevier Interactive Patient Education [employer].

## 2016-01-21 NOTE — ED Notes (Signed)
Pt complains of right lower quad pain for two hours

## 2016-11-18 ENCOUNTER — Other Ambulatory Visit (INDEPENDENT_AMBULATORY_CARE_PROVIDER_SITE_OTHER): Payer: Self-pay | Admitting: Otolaryngology

## 2016-11-18 DIAGNOSIS — J329 Chronic sinusitis, unspecified: Secondary | ICD-10-CM

## 2016-12-06 ENCOUNTER — Other Ambulatory Visit: Payer: BC Managed Care – PPO

## 2017-07-07 ENCOUNTER — Emergency Department (HOSPITAL_COMMUNITY): Payer: BC Managed Care – PPO

## 2017-07-07 ENCOUNTER — Encounter (HOSPITAL_COMMUNITY): Payer: Self-pay

## 2017-07-07 ENCOUNTER — Other Ambulatory Visit: Payer: Self-pay

## 2017-07-07 ENCOUNTER — Emergency Department (HOSPITAL_COMMUNITY)
Admission: EM | Admit: 2017-07-07 | Discharge: 2017-07-08 | Disposition: A | Discharge status: Home / Self Care | Payer: BC Managed Care – PPO | Attending: Emergency Medicine | Admitting: Emergency Medicine

## 2017-07-07 DIAGNOSIS — R0981 Nasal congestion: Secondary | ICD-10-CM | POA: Diagnosis not present

## 2017-07-07 DIAGNOSIS — J45909 Unspecified asthma, uncomplicated: Secondary | ICD-10-CM | POA: Diagnosis not present

## 2017-07-07 DIAGNOSIS — R51 Headache: Secondary | ICD-10-CM | POA: Diagnosis not present

## 2017-07-07 DIAGNOSIS — Z79899 Other long term (current) drug therapy: Secondary | ICD-10-CM | POA: Diagnosis not present

## 2017-07-07 DIAGNOSIS — R42 Dizziness and giddiness: Secondary | ICD-10-CM | POA: Diagnosis present

## 2017-07-07 MED ORDER — MECLIZINE HCL 25 MG PO TABS
50.0000 mg | ORAL_TABLET | Freq: Once | ORAL | Status: AC
  Administered 2017-07-07: 25 mg via ORAL
  Filled 2017-07-07: qty 2

## 2017-07-07 MED ORDER — METOCLOPRAMIDE HCL 5 MG/ML IJ SOLN
10.0000 mg | Freq: Once | INTRAMUSCULAR | Status: AC
  Administered 2017-07-07: 10 mg via INTRAVENOUS
  Filled 2017-07-07: qty 2

## 2017-07-07 MED ORDER — LACTATED RINGERS IV BOLUS (SEPSIS)
1000.0000 mL | Freq: Once | INTRAVENOUS | Status: AC
  Administered 2017-07-07: 1000 mL via INTRAVENOUS

## 2017-07-07 MED ORDER — DIPHENHYDRAMINE HCL 50 MG/ML IJ SOLN
25.0000 mg | Freq: Once | INTRAMUSCULAR | Status: AC
  Administered 2017-07-07: 25 mg via INTRAVENOUS
  Filled 2017-07-07: qty 1

## 2017-07-07 NOTE — ED Triage Notes (Signed)
Patient states she had dizziness x 3 days. Patient has a productive cough and states she had 2 rounds of antibiotics and Mucinex.. Patient states that she saw her doctor about the dizziness and was told she had an upper respiratory infection.

## 2017-07-08 LAB — BASIC METABOLIC PANEL
Anion gap: 9 (ref 5–15)
BUN: 16 mg/dL (ref 6–20)
CALCIUM: 9.4 mg/dL (ref 8.9–10.3)
CO2: 27 mmol/L (ref 22–32)
CREATININE: 0.63 mg/dL (ref 0.44–1.00)
Chloride: 103 mmol/L (ref 101–111)
GLUCOSE: 79 mg/dL (ref 65–99)
Potassium: 3.5 mmol/L (ref 3.5–5.1)
Sodium: 139 mmol/L (ref 135–145)

## 2017-07-08 LAB — CBC WITH DIFFERENTIAL/PLATELET
BASOS ABS: 0 10*3/uL (ref 0.0–0.1)
BASOS PCT: 0 %
EOS ABS: 0.3 10*3/uL (ref 0.0–0.7)
Eosinophils Relative: 3 %
HCT: 40.7 % (ref 36.0–46.0)
Hemoglobin: 14.4 g/dL (ref 12.0–15.0)
Lymphocytes Relative: 37 %
Lymphs Abs: 3.1 10*3/uL (ref 0.7–4.0)
MCH: 31.9 pg (ref 26.0–34.0)
MCHC: 35.4 g/dL (ref 30.0–36.0)
MCV: 90 fL (ref 78.0–100.0)
MONO ABS: 0.6 10*3/uL (ref 0.1–1.0)
MONOS PCT: 7 %
NEUTROS PCT: 53 %
Neutro Abs: 4.4 10*3/uL (ref 1.7–7.7)
Platelets: 297 10*3/uL (ref 150–400)
RBC: 4.52 MIL/uL (ref 3.87–5.11)
RDW: 11.9 % (ref 11.5–15.5)
WBC: 8.4 10*3/uL (ref 4.0–10.5)

## 2017-07-08 MED ORDER — MECLIZINE HCL 25 MG PO TABS: 25.0000 mg | ORAL_TABLET | Freq: Three times a day (TID) | ORAL | 0 refills | Status: DC | PRN

## 2017-07-08 MED ORDER — DEXAMETHASONE SODIUM PHOSPHATE 10 MG/ML IJ SOLN
10.0000 mg | Freq: Once | INTRAMUSCULAR | Status: AC
  Administered 2017-07-08: 10 mg via INTRAVENOUS
  Filled 2017-07-08: qty 1

## 2017-07-08 NOTE — ED Provider Notes (Signed)
[facility] DEPT Provider Note   CSN: [id number] Arrival date & time: 07/07/17  1630     History   Chief Complaint Chief Complaint  Patient presents with  . URI  . Dizziness    HPI Kathryn Black is a 41 y.o. female.  HPI 41 year old Caucasian female past medical history significant for depression, headaches, anxiety presents to the emergency department today for evaluation of sinus congestion and dizziness.  Patient states that for the past week and a half she has had upper respiratory tract infection.  Reports sinus pressure, nasal congestion, rhinorrhea.  Also reports chest congestion and cough.  The patient was seen by her primary care doctor initially when symptoms started and given Augmentin.  States that her symptoms persisted and she was given doxycycline.  States that for the past 3 days she has been having intermittent dizziness.  States that it feels like a room spinning sensation and feels that she is drunk at times.  Patient denies any ataxia.  Patient did see her doctor about her dizziness and was told that she had an upper respiratory tract infection which was causing it.  Was told to take Mucinex.  She has been taking Mucinex with no relief.  Patient does report generalized headache intermittently.  Denies any associated fevers or chills.  Nothing makes her symptoms better or worse.  Denies any history of same.  Denies any neck stiffness.  Pt denies any fever, chill, ha, vision changes, lightheadedness, congestion, neck pain, cp, sob, cough, abd pain, n/v/d, urinary symptoms, change in bowel habits, melena, hematochezia, lower extremity paresthesias.  Past Medical History:  Diagnosis Date  . Anxiety    no meds  . Asthma    childhood-allergy induced  . Depression    no meds  . Headache(784.0)   . Kidney stone     There are no active problems to display for this patient.   Past Surgical History:  Procedure Laterality Date  . BREAST  BIOPSY  1990's   in office procedure under local  . CYSTOSCOPY WITH URETEROSCOPY  11/23/2011   Procedure: CYSTOSCOPY WITH URETEROSCOPY;  Surgeon: Franchot Gallo, MD;  Location: [facility];  Service: Urology;  Laterality: Right;  WITH STONE obtained  . LEEP     in office procedure    OB History    No data available       Home Medications    Prior to Admission medications   Medication Sig Start Date End Date Taking? Authorizing Provider  pseudoephedrine-guaifenesin (MUCINEX D) 60-600 MG 12 hr tablet Take 1 tablet by mouth every 12 (twelve) hours.   Yes [provider]  meclizine (ANTIVERT) 25 MG tablet Take 1 tablet (25 mg total) by mouth 3 (three) times daily as needed for dizziness. 07/08/17   Ocie Cornfield T, PA-C  ondansetron (ZOFRAN ODT) 8 MG disintegrating tablet Take 1 tablet (8 mg total) by mouth every 8 (eight) hours as needed for nausea. Patient not taking: Reported on 07/07/2017 01/21/16   Varney Biles, MD    Family History History reviewed. No pertinent family history.  Social History Social History   Tobacco Use  . Smoking status: Former Smoker    Packs/day: 0.25  . Smokeless tobacco: Never Used  Substance Use Topics  . Alcohol use: Yes    Comment: occ  . Drug use: No     Allergies   Sulfa antibiotics and Erythromycin   Review of Systems Review of Systems  Constitutional: Negative  for chills and fever.  HENT: Positive for congestion, rhinorrhea and sinus pressure.   Eyes: Negative for visual disturbance.  Respiratory: Positive for cough. Negative for shortness of breath.   Cardiovascular: Negative for chest pain.  Gastrointestinal: Negative for abdominal pain, diarrhea, nausea and vomiting.  Genitourinary: Negative for dysuria, flank pain, frequency, hematuria and urgency.  Musculoskeletal: Negative for arthralgias and myalgias.  Skin: Negative for rash.  Neurological: Positive for dizziness and headaches. Negative for  syncope, weakness, light-headedness and numbness.  Psychiatric/Behavioral: Negative for sleep disturbance. The patient is not nervous/anxious.      Physical Exam Updated Vital Signs BP (!) 143/96 (BP Location: Right Arm)   Pulse 76   Temp 98.9 F (37.2 C) (Oral)   Resp 14   Ht 5\' 7"  (1.702 m)   Wt 72.6 kg (160 lb)   LMP 07/07/2017   SpO2 98%   BMI 25.06 kg/m   Physical Exam  Constitutional: She is oriented to person, place, and time. She appears well-developed and well-nourished.  Non-toxic appearance. No distress.  HENT:  Head: Normocephalic and atraumatic.  Right Ear: External ear and ear canal normal. A middle ear effusion is present.  Nose: Mucosal edema and rhinorrhea present. Right sinus exhibits no maxillary sinus tenderness and no frontal sinus tenderness. Left sinus exhibits no maxillary sinus tenderness and no frontal sinus tenderness.  Mouth/Throat: Uvula is midline and oropharynx is clear and moist. No trismus in the jaw.  No facial swelling.  Unable to visualize left TM likely due to cerumen.  Eyes: Conjunctivae and EOM are normal. Pupils are equal, round, and reactive to light. Right eye exhibits no discharge. Left eye exhibits no discharge.  Neck: Normal range of motion. Neck supple.  No c spine midline tenderness. No paraspinal tenderness. No deformities or step offs noted. Full ROM. Supple. No nuchal rigidity.    Cardiovascular: Normal rate, regular rhythm, normal heart sounds and intact distal pulses. Exam reveals no gallop and no friction rub.  No murmur heard. Pulmonary/Chest: Effort normal and breath sounds normal. No stridor. No respiratory distress. She has no wheezes. She has no rales. She exhibits no tenderness.  Abdominal: Soft. Bowel sounds are normal. There is no tenderness. There is no rebound and no guarding.  Musculoskeletal: Normal range of motion. She exhibits no tenderness.  Lymphadenopathy:    She has no cervical adenopathy.  Neurological:  She is alert and oriented to person, place, and time.  The patient is alert, attentive, and oriented x 3. Speech is clear. Cranial nerve II-VII grossly intact. Negative pronator drift. Sensation intact. Strength 5/5 in all extremities. Reflexes 2+ and symmetric at biceps, triceps, knees, and ankles. Rapid alternating movement and fine finger movements intact. Romberg is absent. Posture and gait normal.  Dix-Hallpike reproduces room spinning sensation but no horizontal nystagmus.   Skin: Skin is warm and dry. Capillary refill takes less than 2 seconds.  Psychiatric: Her behavior is normal. Judgment and thought content normal.  Nursing note and vitals reviewed.    ED Treatments / Results  Labs (all labs ordered are listed, but only abnormal results are displayed) Labs Reviewed  BASIC METABOLIC PANEL  CBC WITH DIFFERENTIAL/PLATELET    EKG  EKG Interpretation None       Radiology Ct Head Wo Contrast  Result Date: 07/07/2017 CLINICAL DATA:  41 y/o  F; dizziness for 3 days with cough. EXAM: CT HEAD WITHOUT CONTRAST TECHNIQUE: Contiguous axial images were obtained from the base of the skull through the vertex  without intravenous contrast. COMPARISON:  None. FINDINGS: Brain: No evidence of acute infarction, hemorrhage, hydrocephalus, extra-axial collection or mass lesion/mass effect. Vascular: No hyperdense vessel or unexpected calcification. Skull: Normal. Negative for fracture or focal lesion. Sinuses/Orbits: No acute finding. Other: None. IMPRESSION: Normal CT of the head. Electronically Signed   By: Kristine Garbe M.D.   On: 07/07/2017 23:28    Procedures Procedures (including critical care time)  Medications Ordered in ED Medications  lactated ringers bolus 1,000 mL (0 mLs Intravenous Stopped 07/08/17 0157)  metoCLOPramide (REGLAN) injection 10 mg (10 mg Intravenous Given 07/07/17 2357)  diphenhydrAMINE (BENADRYL) injection 25 mg (25 mg Intravenous Given 07/07/17 2357)    meclizine (ANTIVERT) tablet 50 mg (25 mg Oral Given 07/07/17 2335)  dexamethasone (DECADRON) injection 10 mg (10 mg Intravenous Given 07/08/17 0203)     Initial Impression / Assessment and Plan / ED Course  I have reviewed the triage vital signs and the nursing notes.  Pertinent labs & imaging results that were available during my care of the patient were reviewed by me and considered in my medical decision making (see chart for details).     Patient presents to the ED for evaluation of ongoing nasal congestion and dizziness.  The patient was treated with doxycycline and Augmentin over the past week and a half for sinusitis.  Dizziness started 3 days ago.  Reports room spinning sensation.  Does report an intermittent headache but denies any red flag symptoms.  On exam patient is overall well-appearing and nontoxic.  Vital signs are very reassuring.  The patient has no focal neuro deficits.  She has normal EOMs.  No facial swelling.  No ataxia with ambulation.  Lungs clear to auscultation bilaterally.  Patient has no meningeal signs.  Blood work is reassuring.  Given normal lung exam did not feel that imaging is indicated at this time.  CT scan of head was unremarkable.  Patient treated with fluids, meclizine, Benadryl and Reglan.  Patient symptoms have completely resolved.  Patient able to ambulate with normal gait.  States that she feels much improved.  Patient symptoms seem consistent with likely peripheral vertigo versus post viral labyrinthitis.  Did give Decadron.  Low suspicion for CVA or venous thrombosis.  Have discussed with patient that she would need follow-up in the outpatient setting if symptoms persist or may return to the ED for further workup.  Will give patient meclizine to go home with.  Pt is hemodynamically stable, in NAD, & able to ambulate in the ED. Evaluation does not show pathology that would require ongoing emergent intervention or inpatient treatment. I explained the  diagnosis to the patient. Pain has been managed & has no complaints prior to dc. Pt is comfortable with above plan and is stable for discharge at this time. All questions were answered prior to disposition. Strict return precautions for f/u to the ED were discussed. Encouraged follow up with PCP and ent.   Pt dicussed with Dr. Tyrone Nine who is agreeable with the above plan.   Final Clinical Impressions(s) / ED Diagnoses   Final diagnoses:  Nasal congestion  Sinus congestion  Dizziness    ED Discharge Orders        Ordered    meclizine (ANTIVERT) 25 MG tablet  3 times daily PRN     07/08/17 0200       Doristine Devoid, PA-C 07/08/17 [facility], [place], DO 07/08/17 2304

## 2017-07-08 NOTE — Discharge Instructions (Signed)
Do not know the exact cause of your symptoms. Possibly be due to vertigo or inflammation of your eustachian tubes.  Have given you a prescription for meclizine to take for dizziness.  Drink plenty of fluids.  Continue using Flonase over-the-counter and decongestants.  If you feel like the dizziness is persisting over the weekend or you are not improving please return to the ED immediately for further evaluation.  Would also like for you to follow-up with your primary next week.  If symptoms are not improving you may need further imaging.

## 2017-11-21 ENCOUNTER — Other Ambulatory Visit: Payer: Self-pay | Admitting: Nurse Practitioner

## 2017-11-21 ENCOUNTER — Other Ambulatory Visit (HOSPITAL_COMMUNITY)
Admission: RE | Admit: 2017-11-21 | Discharge: 2017-11-21 | Disposition: A | Discharge status: Home / Self Care | Payer: BC Managed Care – PPO | Source: Ambulatory Visit | Attending: Nurse Practitioner | Admitting: Nurse Practitioner

## 2017-11-21 DIAGNOSIS — Z01419 Encounter for gynecological examination (general) (routine) without abnormal findings: Secondary | ICD-10-CM | POA: Insufficient documentation

## 2017-11-28 LAB — CYTOLOGY - PAP
CHLAMYDIA, DNA PROBE: NEGATIVE
HPV (WINDOPATH): DETECTED — AB
HPV 16/18/45 genotyping: NEGATIVE
Neisseria Gonorrhea: NEGATIVE

## 2017-12-07 ENCOUNTER — Other Ambulatory Visit: Payer: Self-pay | Admitting: Nurse Practitioner

## 2018-02-15 ENCOUNTER — Other Ambulatory Visit: Payer: Self-pay | Admitting: Nurse Practitioner

## 2019-02-26 ENCOUNTER — Other Ambulatory Visit: Payer: Self-pay | Admitting: Family Medicine

## 2019-02-26 DIAGNOSIS — Z1231 Encounter for screening mammogram for malignant neoplasm of breast: Secondary | ICD-10-CM

## 2019-11-04 ENCOUNTER — Emergency Department (HOSPITAL_COMMUNITY)
Admission: EM | Admit: 2019-11-04 | Discharge: 2019-11-04 | Disposition: A | Discharge status: Home / Self Care | Payer: BC Managed Care – PPO | Attending: Emergency Medicine | Admitting: Emergency Medicine

## 2019-11-04 ENCOUNTER — Encounter (HOSPITAL_COMMUNITY): Payer: Self-pay | Admitting: Emergency Medicine

## 2019-11-04 ENCOUNTER — Ambulatory Visit: Payer: BC Managed Care – PPO | Admitting: Allergy and Immunology

## 2019-11-04 ENCOUNTER — Encounter: Payer: Self-pay | Admitting: Allergy and Immunology

## 2019-11-04 ENCOUNTER — Other Ambulatory Visit: Payer: Self-pay

## 2019-11-04 VITALS — BP 180/102 | HR 93 | Temp 98.4°F | Resp 18 | Ht 66.0 in | Wt 177.0 lb

## 2019-11-04 DIAGNOSIS — J3089 Other allergic rhinitis: Secondary | ICD-10-CM | POA: Diagnosis not present

## 2019-11-04 DIAGNOSIS — R030 Elevated blood-pressure reading, without diagnosis of hypertension: Secondary | ICD-10-CM | POA: Insufficient documentation

## 2019-11-04 DIAGNOSIS — I10 Essential (primary) hypertension: Secondary | ICD-10-CM | POA: Diagnosis not present

## 2019-11-04 DIAGNOSIS — J321 Chronic frontal sinusitis: Secondary | ICD-10-CM | POA: Diagnosis not present

## 2019-11-04 DIAGNOSIS — H1010 Acute atopic conjunctivitis, unspecified eye: Secondary | ICD-10-CM | POA: Insufficient documentation

## 2019-11-04 DIAGNOSIS — H1013 Acute atopic conjunctivitis, bilateral: Secondary | ICD-10-CM | POA: Diagnosis not present

## 2019-11-04 DIAGNOSIS — Z5731 Occupational exposure to environmental tobacco smoke: Secondary | ICD-10-CM | POA: Insufficient documentation

## 2019-11-04 DIAGNOSIS — J4520 Mild intermittent asthma, uncomplicated: Secondary | ICD-10-CM | POA: Diagnosis not present

## 2019-11-04 LAB — CBC
HCT: 42.7 % (ref 36.0–46.0)
Hemoglobin: 14.4 g/dL (ref 12.0–15.0)
MCH: 31 pg (ref 26.0–34.0)
MCHC: 33.7 g/dL (ref 30.0–36.0)
MCV: 91.8 fL (ref 80.0–100.0)
Platelets: 309 10*3/uL (ref 150–400)
RBC: 4.65 MIL/uL (ref 3.87–5.11)
RDW: 12.4 % (ref 11.5–15.5)
WBC: 7.8 10*3/uL (ref 4.0–10.5)
nRBC: 0 % (ref 0.0–0.2)

## 2019-11-04 LAB — BASIC METABOLIC PANEL
Anion gap: 9 (ref 5–15)
BUN: 12 mg/dL (ref 6–20)
CO2: 26 mmol/L (ref 22–32)
Calcium: 9.7 mg/dL (ref 8.9–10.3)
Chloride: 104 mmol/L (ref 98–111)
Creatinine, Ser: 0.78 mg/dL (ref 0.44–1.00)
GFR calc Af Amer: >60 mL/min (ref >60)
GFR calc non Af Amer: >60 mL/min (ref >60)
Glucose, Bld: 117 mg/dL — ABNORMAL HIGH (ref 70–99)
Potassium: 4.1 mmol/L (ref 3.5–5.1)
Sodium: 139 mmol/L (ref 135–145)

## 2019-11-04 MED ORDER — XHANCE 93 MCG/ACT NA EXHU: 93.0000 ug | INHALANT_SUSPENSION | Freq: Two times a day (BID) | NASAL | 3 refills | Status: DC | PRN

## 2019-11-04 MED ORDER — CARBINOXAMINE MALEATE 4 MG PO TABS: ORAL_TABLET | ORAL | 3 refills | Status: DC

## 2019-11-04 MED ORDER — AZELASTINE HCL 0.1 % NA SOLN: NASAL | 5 refills | Status: DC

## 2019-11-04 NOTE — Assessment & Plan Note (Addendum)
Environmental epicutaneous tests were negative today despite a positive histamine control.  Intradermal test revealed mild reactivity to molds and dust mite antigen.  Aeroallergen avoidance measures have been discussed and provided in written form.  A prescription has been provided for [facility], 2 actuations per nostril twice a day as needed. Proper technique has been discussed and demonstrated.  A prescription has been provided for azelastine nasal spray, 1-2 sprays per nostril 2 times daily as needed.   Nasal saline lavage (NeilMed) has been recommended as needed and prior to medicated nasal sprays along with instructions for proper administration.  A prescription has been provided for carbinoxamine 4 mg every 6-8 hours if needed.  If this problem persists or progresses despite treatment plan as outlined above, follow-up with otolaryngologist for for further evaluation and treatment options.

## 2019-11-04 NOTE — ED Notes (Signed)
Pt left due to wait time and reported she will follow up with PCP in the morning.

## 2019-11-04 NOTE — Progress Notes (Signed)
New Patient Note  RE: NATILYN VALVANO MRN: [id number] DOB: Jan 31, 1976 Date of Office Visit: 11/04/2019  Referring provider: Kristie Cowman, MD Primary care provider: Kristie Cowman, MD  Chief Complaint: Sinus Problem and Nasal Congestion   History of present illness: Kathryn Black is a 44 y.o. female seen today in consultation requested by Kristie Cowman, MD.  She complains of sinus pressure over the forehead, between the eyes, and over the cheekbones, postnasal drainage "all the time, pretty much constant", nasal congestion, rhinorrhea, sneezing, nasal pruritus, and ocular pruritus.  The symptoms occur year-round but are most frequent and severe during the springtime and in the fall.  She attempt to control the symptoms with fexofenadine daily.  She reports that she typically has 4-6 sinus infections per year requiring antibiotics.  She denies recurrent lower respiratory tract infections, GI infections, skin infections, and urinary tract infections.  She has been allergy skin tested in the past with reactivity to molds but no other aeroallergens.  She is followed by her otolaryngologist, Dr. Melissa Montane.  She reports that she experiences chest tightness and occasional wheezing.  These lower respiratory symptoms are "completely sporadic" without any specific triggers.  She has a prescription for albuterol, which has provided temporary relief of the lower respiratory symptoms, however she is hesitant to use it because it causes shaky hands.  She believes that the albuterol HFA inhaler has expired.  Assessment and plan: Allergic rhinosinusitis with primarily nonallergic component Environmental epicutaneous tests were negative today despite a positive histamine control.  Intradermal test revealed mild reactivity to molds and dust mite antigen.  Aeroallergen avoidance measures have been discussed and provided in written form.  A prescription has been provided for [facility], 2 actuations per nostril twice a day  as needed. Proper technique has been discussed and demonstrated.  A prescription has been provided for azelastine nasal spray, 1-2 sprays per nostril 2 times daily as needed.   Nasal saline lavage (NeilMed) has been recommended as needed and prior to medicated nasal sprays along with instructions for proper administration.  A prescription has been provided for carbinoxamine 4 mg every 6-8 hours if needed.  If this problem persists or progresses despite treatment plan as outlined above, follow-up with otolaryngologist for for further evaluation and treatment options.  Allergic conjunctivitis  Treatment plan as outlined above for allergic rhinitis.  A prescription has been provided for Pataday, one drop per eye daily as needed.  I have also recommended eye lubricant drops (i.e., Natural Tears) as needed.  Mild intermittent asthma  A refill prescription has been provided for albuterol HFA, 1 to 2 inhalations every 4-6 hours if needed.  Subjective and objective measures of pulmonary function will be followed and the treatment plan will be adjusted accordingly.  Elevated blood-pressure reading without diagnosis of hypertension  The patient has been made aware of the elevated blood pressure reading and has been encouraged to proceed to the emergency department to get the blood pressure under better control.  She has also been encouraged to follow-up with her primary care physician regarding this issue for monitoring and medical management.   Meds ordered this encounter  Medications  . Carbinoxamine Maleate 4 MG TABS    Sig: Take 4 mg every 4-6 hours as needed    Dispense:  28 tablet    Refill:  3  . azelastine (ASTELIN) 0.1 % nasal spray    Sig: 1-2 sprays per nostril 2 times a day as needed.    Dispense:  30 mL    Refill:  5  . Fluticasone Propionate (XHANCE) 93 MCG/ACT EXHU    Sig: Place 93 mcg into the nose 2 (two) times daily as needed.    Dispense:  16 mL    Refill:  3     Diagnostics: Spirometry: Spirometry reveals an FVC of 3.29 L (84% predicted) and an FEV1 of 2.39 L (76% predicted) with an FEV1 ratio of 90%.  This study was performed while the patient was asymptomatic.  Please see scanned spirometry results for details. Epicutaneous testing: Negative despite a positive histamine control. Intradermal testing: Positive to molds and dust mite antigen.  Physical examination: Blood pressure (!) 180/102, pulse 93, temperature 98.4 F (36.9 C), temperature source Temporal, resp. rate 18, height 5\' 6"  (1.676 m), weight 177 lb (80.3 kg), SpO2 98 %.  General: Alert, interactive, in no acute distress. HEENT: TMs pearly gray, turbinates edematous with thick discharge, post-pharynx erythematous. Neck: Supple without lymphadenopathy. Lungs: Clear to auscultation without wheezing, rhonchi or rales. CV: Normal S1, S2 without murmurs. Abdomen: Nondistended, nontender. Skin: Warm and dry, without lesions or rashes. Extremities:  No clubbing, cyanosis or edema. Neuro:   Grossly intact.  Review of systems:  Review of systems negative except as noted in HPI / PMHx or noted below: Review of Systems  Constitutional: Negative.   HENT: Negative.   Eyes: Negative.   Respiratory: Negative.   Cardiovascular: Negative.   Gastrointestinal: Negative.   Genitourinary: Negative.   Musculoskeletal: Negative.   Skin: Negative.   Neurological: Negative.   Endo/Heme/Allergies: Negative.   Psychiatric/Behavioral: Negative.    Past medical history:  Past Medical History:  Diagnosis Date  . Anxiety    no meds  . Asthma    childhood-allergy induced  . Depression    no meds  . Headache(784.0)   . Kidney stone     Past surgical history:  Past Surgical History:  Procedure Laterality Date  . BREAST BIOPSY  1990's   in office procedure under local  . CYSTOSCOPY WITH URETEROSCOPY  11/23/2011   Procedure: CYSTOSCOPY WITH URETEROSCOPY;  Surgeon: Franchot Gallo, MD;   Location: [facility];  Service: Urology;  Laterality: Right;  WITH STONE obtained  . LEEP     in office procedure    Family history: History reviewed. No pertinent family history.  Social history: Social History   Socioeconomic History  . Marital status: Single    Spouse name: Not on file  . Number of children: Not on file  . Years of education: Not on file  . Highest education level: Not on file  Occupational History  . Not on file  Tobacco Use  . Smoking status: Former Smoker    Packs/day: 0.25  . Smokeless tobacco: Never Used  Substance and Sexual Activity  . Alcohol use: Yes    Comment: occ  . Drug use: No  . Sexual activity: Yes    Birth control/protection: Pill  Other Topics Concern  . Not on file  Social History Narrative  . Not on file   Social Determinants of Health   Financial Resource Strain:   . Difficulty of Paying Living Expenses:   Food Insecurity:   . Worried About Charity fundraiser in the Last Year:   . Arboriculturist in the Last Year:   Transportation Needs:   . Film/video editor (Medical):   Marland Kitchen Lack of Transportation (Non-Medical):   Physical Activity:   . Days of Exercise per  Week:   . Minutes of Exercise per Session:   Stress:   . Feeling of Stress :   Social Connections:   . Frequency of Communication with Friends and Family:   . Frequency of Social Gatherings with Friends and Family:   . Attends Religious Services:   . Active Member of Clubs or Organizations:   . Attends Archivist Meetings:   Marland Kitchen Marital Status:   Intimate Partner Violence:   . Fear of Current or Ex-Partner:   . Emotionally Abused:   Marland Kitchen Physically Abused:   . Sexually Abused:     Environmental History: The patient lives in a townhouse built in 1960 with hardwood floors throughout.  She suspects that there is mold/water damage in the home.  There is a dog in the house which has access to her bedroom.  She is a former cigarette smoker  having started as a teenager and quit 2 years ago.  She smoked half pack per day on average.  Current Outpatient Medications  Medication Sig Dispense Refill  . fexofenadine (ALLEGRA) 180 MG tablet Take 180 mg by mouth daily.    Marland Kitchen azelastine (ASTELIN) 0.1 % nasal spray 1-2 sprays per nostril 2 times a day as needed. 30 mL 5  . Carbinoxamine Maleate 4 MG TABS Take 4 mg every 4-6 hours as needed 28 tablet 3  . Fluticasone Propionate (XHANCE) 93 MCG/ACT EXHU Place 93 mcg into the nose 2 (two) times daily as needed. 16 mL 3  . meclizine (ANTIVERT) 25 MG tablet Take 1 tablet (25 mg total) by mouth 3 (three) times daily as needed for dizziness. (Patient not taking: Reported on 11/04/2019) 20 tablet 0  . ondansetron (ZOFRAN ODT) 8 MG disintegrating tablet Take 1 tablet (8 mg total) by mouth every 8 (eight) hours as needed for nausea. (Patient not taking: Reported on 07/07/2017) 20 tablet 0  . pseudoephedrine-guaifenesin (MUCINEX D) 60-600 MG 12 hr tablet Take 1 tablet by mouth every 12 (twelve) hours.     No current facility-administered medications for this visit.    Known medication allergies: Allergies  Allergen Reactions  . Sulfa Antibiotics Anaphylaxis  . Erythromycin Hives    I appreciate the opportunity to take part in Kenisha's care. Please do not hesitate to contact me with questions.  Sincerely,   R. Edgar Frisk, MD

## 2019-11-04 NOTE — ED Triage Notes (Signed)
Pt sent from allergy doc due to HTN. HA for 2 days. Reports she was at a wedding this weekend and drank a lot. States she feels dehydrated.

## 2019-11-04 NOTE — Assessment & Plan Note (Signed)
   A refill prescription has been provided for albuterol HFA, 1 to 2 inhalations every 4-6 hours if needed.  Subjective and objective measures of pulmonary function will be followed and the treatment plan will be adjusted accordingly.

## 2019-11-04 NOTE — Patient Instructions (Addendum)
Allergic rhinosinusitis with primarily nonallergic component Environmental epicutaneous tests were negative today despite a positive histamine control.  Intradermal test revealed mild reactivity to molds and dust mite antigen.  Aeroallergen avoidance measures have been discussed and provided in written form.  A prescription has been provided for [facility], 2 actuations per nostril twice a day as needed. Proper technique has been discussed and demonstrated.  A prescription has been provided for azelastine nasal spray, 1-2 sprays per nostril 2 times daily as needed.   Nasal saline lavage (NeilMed) has been recommended as needed and prior to medicated nasal sprays along with instructions for proper administration.  A prescription has been provided for carbinoxamine 4 mg every 6-8 hours if needed.  If this problem persists or progresses despite treatment plan as outlined above, follow-up with otolaryngologist for for further evaluation and treatment options.  Allergic conjunctivitis  Treatment plan as outlined above for allergic rhinitis.  A prescription has been provided for Pataday, one drop per eye daily as needed.  I have also recommended eye lubricant drops (i.e., Natural Tears) as needed.  Mild intermittent asthma  A refill prescription has been provided for albuterol HFA, 1 to 2 inhalations every 4-6 hours if needed.  Subjective and objective measures of pulmonary function will be followed and the treatment plan will be adjusted accordingly.  Elevated blood-pressure reading without diagnosis of hypertension  The patient has been made aware of the elevated blood pressure reading and has been encouraged to proceed to the emergency department to get the blood pressure under better control.  She has also been encouraged to follow-up with her primary care physician regarding this issue for monitoring and medical management.   Return in about 3 months (around 02/03/2020), or if symptoms  worsen or fail to improve.  Control of House Dust Mite Allergen  House dust mites play a major role in allergic asthma and rhinitis.  They occur in environments with high humidity wherever human skin, the food for dust mites is found. High levels have been detected in dust obtained from mattresses, pillows, carpets, upholstered furniture, bed covers, clothes and soft toys.  The principal allergen of the house dust mite is found in its feces.  A gram of dust may contain 1,000 mites and 250,000 fecal particles.  Mite antigen is easily measured in the air during house cleaning activities.    1. Encase mattresses, including the box spring, and pillow, in an air tight cover.  Seal the zipper end of the encased mattresses with wide adhesive tape. 2. Wash the bedding in water of 130 degrees Farenheit weekly.  Avoid cotton comforters/quilts and flannel bedding: the most ideal bed covering is the dacron comforter. 3. Remove all upholstered furniture from the bedroom. 4. Remove carpets, carpet padding, rugs, and non-washable window drapes from the bedroom.  Wash drapes weekly or use plastic window coverings. 5. Remove all non-washable stuffed toys from the bedroom.  Wash stuffed toys weekly. 6. Have the room cleaned frequently with a vacuum cleaner and a damp dust-mop.  The patient should not be in a room which is being cleaned and should wait 1 hour after cleaning before going into the room. 7. Close and seal all heating outlets in the bedroom.  Otherwise, the room will become filled with dust-laden air.  An electric heater can be used to heat the room. 8. Reduce indoor humidity to less than 50%.  Do not use a humidifier.  Control of Mold Allergen  Mold and fungi can grow on  a variety of surfaces provided certain temperature and moisture conditions exist.  Outdoor molds grow on plants, decaying vegetation and soil.  The major outdoor mold, Alternaria and Cladosporium, are found in very high numbers during  hot and dry conditions.  Generally, a late Summer - Fall peak is seen for common outdoor fungal spores.  Rain will temporarily lower outdoor mold spore count, but counts rise rapidly when the rainy period ends.  The most important indoor molds are Aspergillus and Penicillium.  Dark, humid and poorly ventilated basements are ideal sites for mold growth.  The next most common sites of mold growth are the bathroom and the kitchen.  Outdoor [employer] 1. Use air conditioning and keep windows closed 2. Avoid exposure to decaying vegetation. 3. Avoid leaf raking. 4. Avoid grain handling. 5. Consider wearing a face mask if working in moldy areas.  Indoor Mold Control 1. Maintain humidity below 50%. 2. Clean washable surfaces with 5% bleach solution. 3. Remove sources e.g. Contaminated carpets.

## 2019-11-04 NOTE — Assessment & Plan Note (Signed)
   Treatment plan as outlined above for allergic rhinitis.  A prescription has been provided for Pataday, one drop per eye daily as needed.  I have also recommended eye lubricant drops (i.e., Natural Tears) as needed. 

## 2019-11-04 NOTE — Assessment & Plan Note (Signed)
   The patient has been made aware of the elevated blood pressure reading and has been encouraged to proceed to the emergency department to get the blood pressure under better control.  She has also been encouraged to follow-up with her primary care physician regarding this issue for monitoring and medical management.

## 2020-02-05 ENCOUNTER — Ambulatory Visit (INDEPENDENT_AMBULATORY_CARE_PROVIDER_SITE_OTHER): Payer: BC Managed Care – PPO | Admitting: Plastic Surgery

## 2020-02-05 ENCOUNTER — Encounter: Payer: Self-pay | Admitting: Plastic Surgery

## 2020-02-05 ENCOUNTER — Other Ambulatory Visit: Payer: Self-pay

## 2020-02-05 VITALS — BP 126/91 | HR 83 | Temp 98.4°F | Ht 67.0 in | Wt 179.2 lb

## 2020-02-05 DIAGNOSIS — N62 Hypertrophy of breast: Secondary | ICD-10-CM | POA: Diagnosis not present

## 2020-02-05 DIAGNOSIS — M545 Low back pain, unspecified: Secondary | ICD-10-CM

## 2020-02-05 DIAGNOSIS — M4004 Postural kyphosis, thoracic region: Secondary | ICD-10-CM

## 2020-02-05 DIAGNOSIS — M546 Pain in thoracic spine: Secondary | ICD-10-CM

## 2020-02-05 NOTE — Progress Notes (Addendum)
Referring Provider Kristie Cowman, MD [place],  [place] [id number]   CC:  Chief Complaint  Patient presents with  . Advice Only      Kathryn Black is an 44 y.o. female.  HPI: Patient presents to discuss breast reduction.  She has had years of back pain, neck pain and shoulder grooving due to her large breast.  She is currently a 30 4H and would like to be a C cup.  She has no family history of breast cancer and has never had a mammogram.  She has been to the chiropractor multiple times with limited sustained success.  She is quit smoking 2 years ago. Her pain have not been relieved through conservative measures.  She is tried over-the-counter medication, warm packs, cold packs, and specialty bras with limited relief.  She has had a cyst excised in the right lateral breast years ago.  Allergies  Allergen Reactions  . Sulfa Antibiotics Anaphylaxis  . Erythromycin Hives    Outpatient Encounter Medications as of 02/05/2020  Medication Sig  . Fluticasone Propionate (XHANCE) 93 MCG/ACT EXHU Place 93 mcg into the nose 2 (two) times daily as needed.  Marland Kitchen losartan (COZAAR) 50 MG tablet Take 50 mg by mouth daily.  Marland Kitchen azelastine (ASTELIN) 0.1 % nasal spray 1-2 sprays per nostril 2 times a day as needed.  . Carbinoxamine Maleate 4 MG TABS Take 4 mg every 4-6 hours as needed  . fexofenadine (ALLEGRA) 180 MG tablet Take 180 mg by mouth daily.  . meclizine (ANTIVERT) 25 MG tablet Take 1 tablet (25 mg total) by mouth 3 (three) times daily as needed for dizziness. (Patient not taking: Reported on 11/04/2019)  . ondansetron (ZOFRAN ODT) 8 MG disintegrating tablet Take 1 tablet (8 mg total) by mouth every 8 (eight) hours as needed for nausea. (Patient not taking: Reported on 07/07/2017)  . pseudoephedrine-guaifenesin (MUCINEX D) 60-600 MG 12 hr tablet Take 1 tablet by mouth every 12 (twelve) hours.   No facility-administered encounter medications on file as of 02/05/2020.     Past Medical History:    Diagnosis Date  . Anxiety    no meds  . Asthma    childhood-allergy induced  . Depression    no meds  . Headache(784.0)   . Kidney stone     Past Surgical History:  Procedure Laterality Date  . BREAST BIOPSY  1990's   in office procedure under local  . CYSTOSCOPY WITH URETEROSCOPY  11/23/2011   Procedure: CYSTOSCOPY WITH URETEROSCOPY;  Surgeon: Franchot Gallo, MD;  Location: [facility];  Service: Urology;  Laterality: Right;  WITH STONE obtained  . LEEP     in office procedure    No family history on file.  Social History   Social History Narrative  . Not on file     Review of Systems General: Denies fevers, chills, weight loss CV: Denies chest pain, shortness of breath, palpitations  Physical Exam Vitals with BMI 02/05/2020 11/04/2019 11/04/2019  Height 5\' 7"  - -  Weight 179 lbs 3 oz - -  BMI 74.25 - -  Systolic 956 387 564  Diastolic 91 332 951  Pulse 83 79 92    General:  No acute distress,  Alert and oriented, Non-Toxic, Normal speech and affect Breast: She has grade 2 ptosis.  Sternal notch to nipple is 28 cm on the right and 28 cm on the left.  Nipple to fold is 12 cm on the right and 13  cm on the left.  She is slightly bigger on the left.  There is a small scar in the upper outer quadrant on the right side.  Assessment/Plan The patient has bilateral symptomatic macromastia.  She is a good candidate for a breast reduction.  She is interested in pursuing surgical treatment.  The details of breast reduction surgery were discussed.  I explained the procedure in detail along the with the expected scars.  The risks were discussed in detail and include bleeding, infection, damage to surrounding structures, need for additional procedures, nipple loss, change in nipple sensation, persistent pain, contour irregularities and asymmetries.  I explained that breast feeding is often not possible after breast reduction surgery.  We discussed the expected  postoperative course with an overall recovery period of about 1 month.  She demonstrated full understanding of all risks.  We discussed her personal risk factors.  I anticipate approximately 600 g of tissue removed from each side.   Cindra Presume 02/05/2020, 5:36 PM

## 2020-02-19 ENCOUNTER — Other Ambulatory Visit: Payer: Self-pay

## 2020-02-19 MED ORDER — XHANCE 93 MCG/ACT NA EXHU: 2.0000 | INHALANT_SUSPENSION | Freq: Two times a day (BID) | NASAL | 5 refills | Status: DC | PRN

## 2020-03-05 ENCOUNTER — Encounter: Payer: Self-pay | Admitting: Plastic Surgery

## 2020-03-05 ENCOUNTER — Ambulatory Visit (INDEPENDENT_AMBULATORY_CARE_PROVIDER_SITE_OTHER): Payer: Self-pay | Admitting: Plastic Surgery

## 2020-03-05 ENCOUNTER — Other Ambulatory Visit: Payer: Self-pay

## 2020-03-05 VITALS — BP 125/81 | HR 87 | Temp 98.2°F | Ht 67.0 in | Wt 177.0 lb

## 2020-03-05 DIAGNOSIS — N62 Hypertrophy of breast: Secondary | ICD-10-CM

## 2020-03-05 NOTE — Progress Notes (Signed)
Patient presents for a weight check regarding insurance approval for an upcoming surgery.  Her weight today was 177.2 pounds.

## 2020-09-17 ENCOUNTER — Emergency Department
Admission: EM | Admit: 2020-09-17 | Discharge: 2020-09-17 | Disposition: A | Discharge status: Home / Self Care | Payer: BC Managed Care – PPO | Attending: Emergency Medicine | Admitting: Emergency Medicine

## 2020-09-17 ENCOUNTER — Other Ambulatory Visit: Payer: Self-pay

## 2020-09-17 ENCOUNTER — Emergency Department: Payer: BC Managed Care – PPO

## 2020-09-17 DIAGNOSIS — R0602 Shortness of breath: Secondary | ICD-10-CM | POA: Insufficient documentation

## 2020-09-17 DIAGNOSIS — I10 Essential (primary) hypertension: Secondary | ICD-10-CM | POA: Insufficient documentation

## 2020-09-17 DIAGNOSIS — Z79899 Other long term (current) drug therapy: Secondary | ICD-10-CM | POA: Insufficient documentation

## 2020-09-17 DIAGNOSIS — Z87891 Personal history of nicotine dependence: Secondary | ICD-10-CM | POA: Insufficient documentation

## 2020-09-17 DIAGNOSIS — R0789 Other chest pain: Secondary | ICD-10-CM | POA: Diagnosis not present

## 2020-09-17 DIAGNOSIS — J4520 Mild intermittent asthma, uncomplicated: Secondary | ICD-10-CM | POA: Insufficient documentation

## 2020-09-17 DIAGNOSIS — R079 Chest pain, unspecified: Secondary | ICD-10-CM | POA: Diagnosis present

## 2020-09-17 DIAGNOSIS — F439 Reaction to severe stress, unspecified: Secondary | ICD-10-CM | POA: Diagnosis not present

## 2020-09-17 HISTORY — DX: Essential (primary) hypertension: I10

## 2020-09-17 LAB — COMPREHENSIVE METABOLIC PANEL
ALT: 23 U/L (ref 0–44)
AST: 21 U/L (ref 15–41)
Albumin: 4.5 g/dL (ref 3.5–5.0)
Alkaline Phosphatase: 53 U/L (ref 38–126)
Anion gap: 12 (ref 5–15)
BUN: 24 mg/dL — ABNORMAL HIGH (ref 6–20)
CO2: 25 mmol/L (ref 22–32)
Calcium: 9.8 mg/dL (ref 8.9–10.3)
Chloride: 100 mmol/L (ref 98–111)
Creatinine, Ser: 0.85 mg/dL (ref 0.44–1.00)
GFR, Estimated: >60 mL/min (ref >60)
Glucose, Bld: 92 mg/dL (ref 70–99)
Potassium: 3.9 mmol/L (ref 3.5–5.1)
Sodium: 137 mmol/L (ref 135–145)
Total Bilirubin: 0.9 mg/dL (ref 0.3–1.2)
Total Protein: 8 g/dL (ref 6.5–8.1)

## 2020-09-17 LAB — CBC WITH DIFFERENTIAL/PLATELET
Abs Immature Granulocytes: 0.03 10*3/uL (ref 0.00–0.07)
Basophils Absolute: 0 10*3/uL (ref 0.0–0.1)
Basophils Relative: 1 %
Eosinophils Absolute: 0.3 10*3/uL (ref 0.0–0.5)
Eosinophils Relative: 4 %
HCT: 38.2 % (ref 36.0–46.0)
Hemoglobin: 13.4 g/dL (ref 12.0–15.0)
Immature Granulocytes: 0 %
Lymphocytes Relative: 32 %
Lymphs Abs: 2.4 10*3/uL (ref 0.7–4.0)
MCH: 31.4 pg (ref 26.0–34.0)
MCHC: 35.1 g/dL (ref 30.0–36.0)
MCV: 89.5 fL (ref 80.0–100.0)
Monocytes Absolute: 0.6 10*3/uL (ref 0.1–1.0)
Monocytes Relative: 7 %
Neutro Abs: 4.2 10*3/uL (ref 1.7–7.7)
Neutrophils Relative %: 56 %
Platelets: 320 10*3/uL (ref 150–400)
RBC: 4.27 MIL/uL (ref 3.87–5.11)
RDW: 11.8 % (ref 11.5–15.5)
WBC: 7.5 10*3/uL (ref 4.0–10.5)
nRBC: 0 % (ref 0.0–0.2)

## 2020-09-17 LAB — TROPONIN I (HIGH SENSITIVITY)
Troponin I (High Sensitivity): <2 ng/L (ref <18)
Troponin I (High Sensitivity): <2 ng/L (ref <18)

## 2020-09-17 LAB — POC URINE PREG, ED: Preg Test, Ur: NEGATIVE

## 2020-09-17 NOTE — ED Triage Notes (Signed)
Pt states that she has been having cp for the past hour- pt states that it is left sided pain into her neck and down her arm- pt states she is also having SHOB- pt states she has a hx of hypertension

## 2020-09-17 NOTE — ED Notes (Signed)
Pt reports feeling a strange tingling sensation over entire body. Pt report hx of anxiety and intense panic attack, states last "serious panic attack was around 17 years ago". Pt reports feeling increase in heart palpitations, lightheaded and chest pain x 3 weeks. States  " it feels like im on the verge of a panic attack but not really". Reports recent stressors that include moving, work life, children acting out at home.

## 2020-09-17 NOTE — ED Provider Notes (Signed)
[facility] Emergency Department Provider Note  ____________________________________________  Time seen: Approximately 2:57 PM  I have reviewed the triage vital signs and the nursing notes.   HISTORY  Chief Complaint Chest Pain    HPI Kathryn Black is a 45 y.o. female with a history of anxiety, asthma, depression, hypertension who comes ED complaining of chest pain that started about 9:00 AM today, gradual onset, constant, no aggravating or alleviating factors, anterior, radiates to the left arm.  Associated with shortness of breath.  Not exertional.  Not pleuritic.  No vomiting or diaphoresis.  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  No SI or HI or hallucinations.      Past Medical History:  Diagnosis Date  . Anxiety    no meds  . Asthma    childhood-allergy induced  . Depression    no meds  . Headache(784.0)   . Hypertension   . Kidney stone      Patient Active Problem List   Diagnosis Date Noted  . Chronic sinusitis 11/04/2019  . 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 Allergic conjunctivitis 11/04/2019  . Mild intermittent asthma 11/04/2019  . Elevated blood-pressure reading without diagnosis of hypertension 11/04/2019     Past Surgical History:  Procedure Laterality Date  . BREAST BIOPSY  1990's   in office procedure under local  . CYSTOSCOPY WITH URETEROSCOPY  11/23/2011   Procedure: CYSTOSCOPY WITH URETEROSCOPY;  Surgeon: Franchot Gallo, MD;  Location: [facility];  Service: Urology;  Laterality: Right;  WITH STONE obtained  . LEEP     in office procedure     Prior to Admission medications   Medication Sig Start Date End Date Taking? Authorizing Provider  azelastine (ASTELIN) 0.1 % nasal spray 1-2 sprays per nostril 2 times a day as needed. 11/04/19   Bobbitt,  Sedalia Muta, MD  Carbinoxamine Maleate 4 MG TABS Take 4 mg every 4-6 hours as needed 11/04/19   Bobbitt, Sedalia Muta, MD  fexofenadine (ALLEGRA) 180 MG tablet Take 180 mg by mouth daily.    [provider]  Fluticasone Propionate (XHANCE) 93 MCG/ACT EXHU Place 2 sprays into the nose 2 (two) times daily as needed. 02/19/20   Bobbitt, Sedalia Muta, MD  losartan (COZAAR) 50 MG tablet Take 50 mg by mouth daily. 02/03/20   [provider]  meclizine (ANTIVERT) 25 MG tablet Take 1 tablet (25 mg total) by mouth 3 (three) times daily as needed for dizziness. Patient not taking: Reported on 11/04/2019 07/08/17   Ocie Cornfield T, PA-C  ondansetron (ZOFRAN ODT) 8 MG disintegrating tablet Take 1 tablet (8 mg total) by mouth every 8 (eight) hours as needed for nausea. Patient not taking: Reported on 07/07/2017 01/21/16   Varney Biles, MD  pseudoephedrine-guaifenesin (MUCINEX D) 60-600 MG 12 hr tablet Take 1 tablet by mouth every 12 (twelve) hours.    [provider]     Allergies Sulfa antibiotics, Erythromycin, and Zithromax [azithromycin]   No family history on file.  Social History Social History   Tobacco Use  . Smoking status: Former Smoker    Packs/day: 0.25  . Smokeless tobacco: Never Used  Vaping Use  . Vaping Use: Former  Substance Use Topics  . Alcohol use: Yes    Comment: occ  . Drug use: No    Review of Systems  Constitutional:  No fever   ENT:   No sore throat. Cardiovascular:   Positive chest pain as above without syncope. Respiratory:   No dyspnea or cough. Gastrointestinal:   Negative for abdominal pain, vomiting and diarrhea.  Musculoskeletal:   Negative for focal pain or swelling All other systems reviewed and are negative except as documented above in ROS and HPI.  ____________________________________________   PHYSICAL EXAM:  VITAL SIGNS: ED Triage Vitals  Enc Vitals Group     BP 09/17/20 1038 (!) 144/97     Pulse Rate 09/17/20  1038 74     Resp 09/17/20 1038 16     Temp 09/17/20 1038 98.4 F (36.9 C)     Temp Source 09/17/20 1038 Oral     SpO2 09/17/20 1038 100 %     Weight 09/17/20 1036 180 lb (81.6 kg)     Height 09/17/20 1036 5\' 8"  (1.727 m)     Head Circumference --      Peak Flow --      Pain Score 09/17/20 1036 4     Pain Loc --      Pain Edu? --      Excl. in [place]? --     Vital signs reviewed, nursing assessments reviewed.   Constitutional:   Alert and oriented. Non-toxic appearance. Eyes:   Conjunctivae are normal. EOMI. PERRL. ENT      Head:   Normocephalic and atraumatic.      Nose:   Wearing a mask.      Mouth/Throat:   Wearing a mask.      Neck:   No meningismus. Full ROM. Hematological/Lymphatic/Immunilogical:   No cervical lymphadenopathy. Cardiovascular:   RRR. Symmetric bilateral radial and DP pulses.  No murmurs. Cap refill less than 2 seconds. Respiratory:   Normal respiratory effort without tachypnea/retractions. Breath sounds are clear and equal bilaterally. No wheezes/rales/rhonchi. Gastrointestinal:   Soft and nontender. Non distended.   No rebound, rigidity, or guarding.  Musculoskeletal:   Normal range of motion in all extremities. No joint effusions.  No lower extremity tenderness.  No edema. Neurologic:   Normal speech and language.  Motor grossly intact. No acute focal neurologic deficits are appreciated.  Skin:    Skin is warm, dry and intact. No rash noted.  No petechiae, purpura, or bullae.  ____________________________________________    LABS (pertinent positives/negatives) (all labs ordered are listed, but only abnormal results are displayed) Labs Reviewed  COMPREHENSIVE METABOLIC PANEL - Abnormal; Notable for the following components:      Result Value   BUN 24 (*)    All other components within normal limits  CBC WITH DIFFERENTIAL/PLATELET  POC URINE PREG, ED  TROPONIN I (HIGH SENSITIVITY)  TROPONIN I (HIGH SENSITIVITY)    ____________________________________________   EKG  Interpreted by me  Date: 09/17/2020  Rate: 71  Rhythm: normal sinus rhythm  QRS Axis: normal  Intervals: normal  ST/T Wave abnormalities: normal  Conduction Disutrbances: none  Narrative Interpretation: unremarkable      ____________________________________________    RADIOLOGY  DG Chest Portable 1 View  Result Date: 09/17/2020 CLINICAL DATA:  Chest pain. EXAM: PORTABLE CHEST 1 VIEW COMPARISON:  No prior. FINDINGS: Mediastinum and hilar structures normal. Heart size normal. No focal infiltrate. No pleural effusion or pneumothorax. IMPRESSION: No acute cardiopulmonary disease. Electronically Signed   By: Marcello Moores  Register   On: 09/17/2020 12:17    ____________________________________________   PROCEDURES Procedures  ____________________________________________  DIFFERENTIAL DIAGNOSIS   Anxiety, GERD, pneumonia, adjustment disorder, non-STEMI  CLINICAL IMPRESSION / ASSESSMENT AND PLAN / ED COURSE  Medications ordered in the ED: Medications - No data to display  Pertinent labs & imaging results that were available during my care of the patient were reviewed by me and considered in my medical decision making (see chart for details).  COTINA FREEDMAN was evaluated in Emergency Department on 09/17/2020 for the symptoms described in the history of present illness. She was evaluated in the context of the global COVID-19 pandemic, which necessitated consideration that the patient might be at risk for infection with the SARS-CoV-2 virus that causes COVID-19. Institutional protocols and algorithms that pertain to the evaluation of patients at risk for COVID-19 are in a state of rapid change based on information released by regulatory bodies including the CDC and federal and state organizations. These policies and algorithms were followed during the patient's care in the ED.   Patient presents with atypical chest pain.  Most  likely anxiety/stress reaction. Considering the patient's symptoms, medical history, and physical examination today, I have low suspicion for ACS, PE, TAD, pneumothorax, carditis, mediastinitis, pneumonia, CHF, or sepsis.  EKG, chest x-ray unremarkable.  Serial troponins negative, rest of the lab panel is unremarkable.  Pain currently resolved.  Stable for discharge home.      ____________________________________________   FINAL CLINICAL IMPRESSION(S) / ED DIAGNOSES    Final diagnoses:  Atypical chest pain  Stress     ED Discharge Orders    None      Portions of this note were generated with dragon dictation software. Dictation errors may occur despite best attempts at proofreading.   Carrie Mew, MD 09/17/20 1501

## 2021-06-02 LAB — RESULTS CONSOLE HPV: CHL HPV: NEGATIVE

## 2021-06-02 LAB — HM PAP SMEAR

## 2021-07-20 NOTE — Progress Notes (Signed)
BP 106/73    Pulse 83    Temp 98.1 F (36.7 C) (Oral)    Ht 5' 6.3" (1.684 m)    Wt 182 lb 6.4 oz (82.7 kg)    LMP 06/19/2021 (Approximate)    SpO2 98%    BMI 29.17 kg/m    Subjective:    Patient ID: Kathryn Black, female    DOB: 06-26-1976, 45 y.o.   MRN: [id number]  HPI: Kathryn Black is a 45 y.o. female  Chief Complaint  Patient presents with   Hypertension   Anxiety   Fall    Pt states she had a fall on Friday. States she fell and hit her head and thinks she may have a concussion   Establish Care   Patient presents to clinic to establish care with new PCP.  Introduced to Designer, jewellery role and practice setting.  All questions answered.  Discussed provider/patient relationship and expectations.  Patient reports a history of Hypertension, Anxiety and depression.  ANXIETY/DEPRESSION Patient states she is taking lexapro 10mg  once daily.  She feels like this is a good dose for her.  She does see a therapist for her anxiety.  Denies SI.  HYPERTENSION Patient states the losartan is working well for her.  She occasionally checks her blood pressures but not very often.   Patient denies a history of: Elevated Cholesterol, Diabetes, Thyroid problems, Depression, Anxiety, Neurological problems, and Abdominal problems.   Patient states she fell last week  on Friday night and hit her head.  She is having a lot of pain on the right side of her head.  She hit her head, neck and base of her skull on the table.  She has a little bit of nausea that comes and goes.  She has been taking motrin to help with her pain.  She threw up right after hitting her head but not sure if it was alcohol related or from the fall.  States if she looks down or up too long she has a little bit of dizziness. Denies blurred vision, denies worse headache of her life, vomiting.  Active Ambulatory Problems    Diagnosis Date Noted   Chronic sinusitis 11/04/2019   Allergic rhinosinusitis with primarily nonallergic  component 11/04/2019   Allergic conjunctivitis 11/04/2019   Mild intermittent asthma 11/04/2019   Depression    Anxiety    Hypertension    Resolved Ambulatory Problems    Diagnosis Date Noted   No Resolved Ambulatory Problems   Past Medical History:  Diagnosis Date   Asthma    Headache(784.0)    Kidney stone    Past Surgical History:  Procedure Laterality Date   BREAST BIOPSY  04/01/1989   in office procedure under local   CYSTOSCOPY WITH URETEROSCOPY  11/23/2011   Procedure: CYSTOSCOPY WITH URETEROSCOPY;  Surgeon: Franchot Gallo, MD;  Location: [facility];  Service: Urology;  Laterality: Right;  WITH STONE obtained   KIDNEY STONE SURGERY     LEEP     in office procedure   Family History  Problem Relation Age of Onset   Depression Sister    Hashimoto's thyroiditis Sister      Review of Systems  Eyes:  Negative for visual disturbance.  Respiratory:  Negative for cough, chest tightness and shortness of breath.   Cardiovascular:  Negative for chest pain, palpitations and leg swelling.  Gastrointestinal:  Positive for nausea. Negative for vomiting.  Neurological:  Positive for dizziness and headaches.  Psychiatric/Behavioral:  Positive for dysphoric mood. Negative for suicidal ideas. The patient is nervous/anxious.    Per HPI unless specifically indicated above     Objective:    BP 106/73    Pulse 83    Temp 98.1 F (36.7 C) (Oral)    Ht 5' 6.3" (1.684 m)    Wt 182 lb 6.4 oz (82.7 kg)    LMP 06/19/2021 (Approximate)    SpO2 98%    BMI 29.17 kg/m   Wt Readings from Last 3 Encounters:  07/21/21 182 lb 6.4 oz (82.7 kg)  09/17/20 180 lb (81.6 kg)  03/05/20 177 lb (80.3 kg)    Physical Exam Vitals and nursing note reviewed.  Constitutional:      General: She is not in acute distress.    Appearance: Normal appearance. She is normal weight. She is not ill-appearing, toxic-appearing or diaphoretic.  HENT:     Head: Normocephalic.     Right Ear:  External ear normal.     Left Ear: External ear normal.     Nose: Nose normal.     Mouth/Throat:     Mouth: Mucous membranes are moist.     Pharynx: Oropharynx is clear.  Eyes:     General:        Right eye: No discharge.        Left eye: No discharge.     Extraocular Movements: Extraocular movements intact.     Conjunctiva/sclera: Conjunctivae normal.     Pupils: Pupils are equal, round, and reactive to light.  Cardiovascular:     Rate and Rhythm: Normal rate and regular rhythm.     Heart sounds: No murmur heard. Pulmonary:     Effort: Pulmonary effort is normal. No respiratory distress.     Breath sounds: Normal breath sounds. No wheezing or rales.  Musculoskeletal:     Cervical back: Normal range of motion and neck supple.  Skin:    General: Skin is warm and dry.     Capillary Refill: Capillary refill takes less than 2 seconds.  Neurological:     General: No focal deficit present.     Mental Status: She is alert and oriented to person, place, and time. Mental status is at baseline.     Cranial Nerves: Cranial nerves 2-12 are intact.     Sensory: Sensation is intact.     Motor: Motor function is intact.     Coordination: Coordination is intact. Romberg sign negative. Coordination normal.     Gait: Gait is intact.  Psychiatric:        Mood and Affect: Mood normal.        Behavior: Behavior normal.        Thought Content: Thought content normal.        Judgment: Judgment normal.    Results for orders placed or performed during the hospital encounter of 09/17/20  CBC with Differential  Result Value Ref Range   WBC 7.5 4.0 - 10.5 K/uL   RBC 4.27 3.87 - 5.11 MIL/uL   Hemoglobin 13.4 12.0 - 15.0 g/dL   HCT 38.2 36.0 - 46.0 %   MCV 89.5 80.0 - 100.0 fL   MCH 31.4 26.0 - 34.0 pg   MCHC 35.1 30.0 - 36.0 g/dL   RDW 11.8 11.5 - 15.5 %   Platelets 320 150 - 400 K/uL   nRBC 0.0 0.0 - 0.2 %   Neutrophils Relative % 56 %   Neutro Abs 4.2 1.7 - 7.7 K/uL  Lymphocytes Relative  32 %   Lymphs Abs 2.4 0.7 - 4.0 K/uL   Monocytes Relative 7 %   Monocytes Absolute 0.6 0.1 - 1.0 K/uL   Eosinophils Relative 4 %   Eosinophils Absolute 0.3 0.0 - 0.5 K/uL   Basophils Relative 1 %   Basophils Absolute 0.0 0.0 - 0.1 K/uL   Immature Granulocytes 0 %   Abs Immature Granulocytes 0.03 0.00 - 0.07 K/uL  Comprehensive metabolic panel  Result Value Ref Range   Sodium 137 135 - 145 mmol/L   Potassium 3.9 3.5 - 5.1 mmol/L   Chloride 100 98 - 111 mmol/L   CO2 25 22 - 32 mmol/L   Glucose, Bld 92 70 - 99 mg/dL   BUN 24 (H) 6 - 20 mg/dL   Creatinine, Ser 0.85 0.44 - 1.00 mg/dL   Calcium 9.8 8.9 - 10.3 mg/dL   Total Protein 8.0 6.5 - 8.1 g/dL   Albumin 4.5 3.5 - 5.0 g/dL   AST 21 15 - 41 U/L   ALT 23 0 - 44 U/L   Alkaline Phosphatase 53 38 - 126 U/L   Total Bilirubin 0.9 0.3 - 1.2 mg/dL   GFR, Estimated >60 >60 mL/min   Anion gap 12 5 - 15  POC urine preg, ED (not at [facility])  Result Value Ref Range   Preg Test, Ur NEGATIVE NEGATIVE  Troponin I (High Sensitivity)  Result Value Ref Range   Troponin I (High Sensitivity) <2 <18 ng/L  Troponin I (High Sensitivity)  Result Value Ref Range   Troponin I (High Sensitivity) <2 <18 ng/L      Assessment & Plan:   Problem List Items Addressed This Visit       Cardiovascular and Mediastinum   Hypertension    Chronic.  Controlled.  Continue with current medication regimen on Losartan 50mg  daily.   Return to clinic in 1 month for physical and fasting labs.  Will check labs at next visit.  Call sooner if concerns arise.          Other   Depression    Chronic.  Controlled.  Continue with current medication regimen on Lexapro 10mg  daily.  Patient feels like this is a good dose for her.  Return to clinic in 1 months for reevaluation.  Call sooner if concerns arise.        Relevant Medications   escitalopram (LEXAPRO) 10 MG tablet   Anxiety    Chronic.  Controlled.  Continue with current medication regimen on Lexapro 10mg  daily.   Patient feels like this is a good dose for her.  Return to clinic in 1 months for reevaluation.  Call sooner if concerns arise.        Relevant Medications   escitalopram (LEXAPRO) 10 MG tablet   Other Visit Diagnoses     Concussion without loss of consciousness, initial encounter    -  Primary   Neuro exam WNL. Discussed s/s to monitor for and can order imaging if symptoms worsen. Can use Tylenol PRN for headache. FU if symptoms do not improve.   Encounter to establish care            Follow up plan: Return in about 1 month (around 08/21/2021) for Physical and Fasting labs.

## 2021-07-21 ENCOUNTER — Encounter: Payer: Self-pay | Admitting: Nurse Practitioner

## 2021-07-21 ENCOUNTER — Ambulatory Visit: Payer: BC Managed Care – PPO | Admitting: Nurse Practitioner

## 2021-07-21 ENCOUNTER — Other Ambulatory Visit: Payer: Self-pay

## 2021-07-21 VITALS — BP 106/73 | HR 83 | Temp 98.1°F | Ht 66.3 in | Wt 182.4 lb

## 2021-07-21 DIAGNOSIS — F32A Depression, unspecified: Secondary | ICD-10-CM

## 2021-07-21 DIAGNOSIS — I10 Essential (primary) hypertension: Secondary | ICD-10-CM

## 2021-07-21 DIAGNOSIS — S060X0A Concussion without loss of consciousness, initial encounter: Secondary | ICD-10-CM | POA: Diagnosis not present

## 2021-07-21 DIAGNOSIS — Z7689 Persons encountering health services in other specified circumstances: Secondary | ICD-10-CM | POA: Diagnosis not present

## 2021-07-21 DIAGNOSIS — F419 Anxiety disorder, unspecified: Secondary | ICD-10-CM

## 2021-07-21 NOTE — Assessment & Plan Note (Signed)
Chronic.  Controlled.  Continue with current medication regimen on Losartan 50mg  daily.   Return to clinic in 1 month for physical and fasting labs.  Will check labs at next visit.  Call sooner if concerns arise.

## 2021-07-21 NOTE — Assessment & Plan Note (Signed)
Chronic.  Controlled.  Continue with current medication regimen on Lexapro 10mg  daily.  Patient feels like this is a good dose for her.  Return to clinic in 1 months for reevaluation.  Call sooner if concerns arise.

## 2021-08-02 ENCOUNTER — Ambulatory Visit: Payer: BC Managed Care – PPO | Admitting: Nurse Practitioner

## 2021-08-26 ENCOUNTER — Encounter: Payer: BC Managed Care – PPO | Admitting: Nurse Practitioner

## 2021-08-27 ENCOUNTER — Other Ambulatory Visit: Payer: Self-pay

## 2021-08-27 ENCOUNTER — Encounter: Payer: Self-pay | Admitting: Nurse Practitioner

## 2021-08-27 ENCOUNTER — Ambulatory Visit (INDEPENDENT_AMBULATORY_CARE_PROVIDER_SITE_OTHER): Payer: BC Managed Care – PPO | Admitting: Nurse Practitioner

## 2021-08-27 VITALS — BP 118/88 | HR 73 | Temp 98.4°F | Ht 66.3 in | Wt 184.8 lb

## 2021-08-27 DIAGNOSIS — Z1159 Encounter for screening for other viral diseases: Secondary | ICD-10-CM

## 2021-08-27 DIAGNOSIS — R5383 Other fatigue: Secondary | ICD-10-CM

## 2021-08-27 DIAGNOSIS — I10 Essential (primary) hypertension: Secondary | ICD-10-CM

## 2021-08-27 DIAGNOSIS — Z0000 Encounter for general adult medical examination without abnormal findings: Secondary | ICD-10-CM | POA: Diagnosis not present

## 2021-08-27 DIAGNOSIS — F419 Anxiety disorder, unspecified: Secondary | ICD-10-CM

## 2021-08-27 DIAGNOSIS — F32A Depression, unspecified: Secondary | ICD-10-CM | POA: Diagnosis not present

## 2021-08-27 DIAGNOSIS — Z114 Encounter for screening for human immunodeficiency virus [HIV]: Secondary | ICD-10-CM

## 2021-08-27 DIAGNOSIS — M542 Cervicalgia: Secondary | ICD-10-CM | POA: Diagnosis not present

## 2021-08-27 DIAGNOSIS — Z1231 Encounter for screening mammogram for malignant neoplasm of breast: Secondary | ICD-10-CM

## 2021-08-27 LAB — URINALYSIS, ROUTINE W REFLEX MICROSCOPIC
Bilirubin, UA: NEGATIVE
Glucose, UA: NEGATIVE
Ketones, UA: NEGATIVE
Leukocytes,UA: NEGATIVE
Nitrite, UA: NEGATIVE
Protein,UA: NEGATIVE
RBC, UA: NEGATIVE
Specific Gravity, UA: 1.025 (ref 1.005–1.030)
Urobilinogen, Ur: 1 mg/dL (ref 0.2–1.0)
pH, UA: 6 (ref 5.0–7.5)

## 2021-08-27 MED ORDER — CYCLOBENZAPRINE HCL 5 MG PO TABS: 5.0000 mg | ORAL_TABLET | Freq: Every day | ORAL | 0 refills | Status: DC

## 2021-08-27 MED ORDER — ESCITALOPRAM OXALATE 10 MG PO TABS: 10.0000 mg | ORAL_TABLET | Freq: Every day | ORAL | 1 refills | Status: AC

## 2021-08-27 MED ORDER — LOSARTAN POTASSIUM 50 MG PO TABS: 50.0000 mg | ORAL_TABLET | Freq: Every day | ORAL | 1 refills | Status: DC

## 2021-08-27 NOTE — Assessment & Plan Note (Signed)
Chronic.  Controlled.  Continue with current medication regimen of Losartan 50mg  daily.  Refill sent today.  Labs ordered today.  Return to clinic in 6 months for reevaluation.  Call sooner if concerns arise.

## 2021-08-27 NOTE — Assessment & Plan Note (Signed)
Chronic.  Controlled.  Continue with current medication regimen on Lexapro 10mg  daily.  Refill sent today.  Labs ordered today.  Return to clinic in 6 months for reevaluation.  Call sooner if concerns arise.

## 2021-08-27 NOTE — Patient Instructions (Signed)
Please call to schedule your mammogram and/or bone density: °[facility]  °Address: [address], [place], [place] [address]  °Phone: [phone] ° °

## 2021-08-27 NOTE — Progress Notes (Signed)
BP 118/88    Pulse 73    Temp 98.4 F (36.9 C) (Oral)    Ht 5' 6.3" (1.684 m)    Wt 184 lb 12.8 oz (83.8 kg)    SpO2 99%    BMI 29.56 kg/m    Subjective:    Patient ID: Kathryn Black, female    DOB: 1976-06-18, 46 y.o.   MRN: [id number]  HPI: Kathryn Black is a 46 y.o. female presenting on 08/27/2021 for comprehensive medical examination. Current medical complaints include: Patient has a broken toe and  is seeing [facility].  She currently lives with: Menopausal Symptoms: no  Patient is having a lot of fatigue.  Her sister has hashimotos.    HYPERTENSION Hypertension status: controlled  Satisfied with current treatment? yes Duration of hypertension: years BP monitoring frequency:  not checking BP range:  BP medication side effects:  no Medication compliance: excellent compliance Previous BP meds:losartan (cozaar) Aspirin: no Recurrent headaches: yes Visual changes: no Palpitations: no Dyspnea: no Chest pain: no Lower extremity edema: no Dizzy/lightheaded: no  NECK PAIN Patient is having ongoing neck pain from a fall back in December.  Rates pain at a 5/10.  States it gets worse as the day goes on.  Does have pain with turning her head and the headaches started about the same time.  Denies pain to touch.   DEPRESSION Patient states she feels like her depression and anxiety are well controlled with the Lexapro.  Denies concerns at visit today.   Depression Screen done today and results listed below:  Depression screen [facility] 2/9 08/27/2021 07/21/2021  Decreased Interest 0 0  Down, Depressed, Hopeless 0 0  PHQ - 2 Score 0 0  Altered sleeping 1 1  Tired, decreased energy 1 1  Change in appetite 1 1  Feeling bad or failure about yourself  0 0  Trouble concentrating 1 1  Moving slowly or fidgety/restless 0 0  Suicidal thoughts 0 0  PHQ-9 Score 4 4  Difficult doing work/chores Not difficult at all Somewhat difficult    The patient has a history of falls. I did complete a  risk assessment for falls. A plan of care for falls was documented.   Past Medical History:  Past Medical History:  Diagnosis Date   Anxiety    no meds   Asthma    childhood-allergy induced   Depression    no meds   Headache(784.0)    Hypertension    Kidney stone     Surgical History:  Past Surgical History:  Procedure Laterality Date   BREAST BIOPSY  04/01/1989   in office procedure under local   CYSTOSCOPY WITH URETEROSCOPY  11/23/2011   Procedure: CYSTOSCOPY WITH URETEROSCOPY;  Surgeon: Franchot Gallo, MD;  Location: [facility];  Service: Urology;  Laterality: Right;  WITH STONE obtained   KIDNEY STONE SURGERY     LEEP     in office procedure    Medications:  No current outpatient medications on file prior to visit.   No current facility-administered medications on file prior to visit.    Allergies:  Allergies  Allergen Reactions   Sulfa Antibiotics Anaphylaxis   Erythromycin Hives   Zithromax [Azithromycin]     Social History:  Social History   Socioeconomic History   Marital status: Single    Spouse name: Not on file   Number of children: Not on file   Years of education: Not on file   Highest education  level: Not on file  Occupational History   Not on file  Tobacco Use   Smoking status: Former    Packs/day: 0.25    Types: Cigarettes   Smokeless tobacco: Never  Vaping Use   Vaping Use: Former  Substance and Sexual Activity   Alcohol use: Yes    Comment: occ   Drug use: No   Sexual activity: Yes  Other Topics Concern   Not on file  Social History Narrative   Not on file   Social Determinants of Health   Financial Resource Strain: Not on file  Food Insecurity: Not on file  Transportation Needs: Not on file  Physical Activity: Not on file  Stress: Not on file  Social Connections: Not on file  Intimate Partner Violence: Not on file   Social History   Tobacco Use  Smoking Status Former   Packs/day: 0.25   Types:  Cigarettes  Smokeless Tobacco Never   Social History   Substance and Sexual Activity  Alcohol Use Yes   Comment: occ    Family History:  Family History  Problem Relation Age of Onset   Depression Sister    Hashimoto's thyroiditis Sister     Past medical history, surgical history, medications, allergies, family history and social history reviewed with patient today and changes made to appropriate areas of the chart.   Review of Systems  Eyes:  Negative for blurred vision and double vision.  Respiratory:  Negative for shortness of breath.   Cardiovascular:  Negative for chest pain, palpitations and leg swelling.  Musculoskeletal:  Positive for neck pain.  Neurological:  Negative for dizziness and headaches.  Psychiatric/Behavioral:  Positive for depression. The patient is nervous/anxious.   All other ROS negative except what is listed above and in the HPI.      Objective:    BP 118/88    Pulse 73    Temp 98.4 F (36.9 C) (Oral)    Ht 5' 6.3" (1.684 m)    Wt 184 lb 12.8 oz (83.8 kg)    SpO2 99%    BMI 29.56 kg/m   Wt Readings from Last 3 Encounters:  08/27/21 184 lb 12.8 oz (83.8 kg)  07/21/21 182 lb 6.4 oz (82.7 kg)  09/17/20 180 lb (81.6 kg)    Physical Exam Vitals and nursing note reviewed.  Constitutional:      General: She is not in acute distress.    Appearance: Normal appearance. She is normal weight. She is not ill-appearing, toxic-appearing or diaphoretic.  HENT:     Head: Normocephalic.     Right Ear: External ear normal.     Left Ear: External ear normal.     Nose: Nose normal.     Mouth/Throat:     Mouth: Mucous membranes are moist.     Pharynx: Oropharynx is clear.  Eyes:     General:        Right eye: No discharge.        Left eye: No discharge.     Extraocular Movements: Extraocular movements intact.     Conjunctiva/sclera: Conjunctivae normal.     Pupils: Pupils are equal, round, and reactive to light.  Cardiovascular:     Rate and Rhythm:  Normal rate and regular rhythm.     Heart sounds: No murmur heard. Pulmonary:     Effort: Pulmonary effort is normal. No respiratory distress.     Breath sounds: Normal breath sounds. No wheezing or rales.  Musculoskeletal:  Cervical back: Normal range of motion and neck supple. Pain with movement and muscular tenderness present.  Skin:    General: Skin is warm and dry.     Capillary Refill: Capillary refill takes less than 2 seconds.  Neurological:     General: No focal deficit present.     Mental Status: She is alert and oriented to person, place, and time. Mental status is at baseline.  Psychiatric:        Mood and Affect: Mood normal.        Behavior: Behavior normal.        Thought Content: Thought content normal.        Judgment: Judgment normal.    Results for orders placed or performed in visit on 08/06/21  HM PAP SMEAR  Result Value Ref Range   HM Pap smear see report scanned into chart   Results Console HPV  Result Value Ref Range   CHL HPV Negative       Assessment & Plan:   Problem List Items Addressed This Visit       Cardiovascular and Mediastinum   Hypertension    Chronic.  Controlled.  Continue with current medication regimen of Losartan 50mg  daily.  Refill sent today.  Labs ordered today.  Return to clinic in 6 months for reevaluation.  Call sooner if concerns arise.        Relevant Medications   losartan (COZAAR) 50 MG tablet     Other   Depression    Chronic.  Controlled.  Continue with current medication regimen on Lexapro 10mg  daily.  Refill sent today.  Labs ordered today.  Return to clinic in 6 months for reevaluation.  Call sooner if concerns arise.        Relevant Medications   escitalopram (LEXAPRO) 10 MG tablet   Anxiety    Chronic.  Controlled.  Continue with current medication regimen on Lexapro 10mg  daily.  Refill sent today.  Labs ordered today.  Return to clinic in 6 months for reevaluation.  Call sooner if concerns arise.         Relevant Medications   escitalopram (LEXAPRO) 10 MG tablet   Other Visit Diagnoses     Annual physical exam    -  Primary   Health maintenance reviewed during visit.  Labs ordered. Declined flu. Mammogram ordered.   Relevant Orders   CBC with Differential/Platelet   Comprehensive metabolic panel   Lipid panel   Urinalysis, Routine w reflex microscopic   Neck pain       likely related to muscle spasm. Will send Flexeril 10mg  patient to take for 30 days. Recommend cervical neck stretches. FU if symptoms do not improve.   Other fatigue       Labs ordered to evaluate thyroid. Will make recommendations based on lab results.   Relevant Orders   Thyroid Panel With TSH   Encounter for hepatitis C screening test for low risk patient       Relevant Orders   Hepatitis C Antibody   Screening for HIV (human immunodeficiency virus)       Relevant Orders   HIV Antibody (routine testing w rflx)   Encounter for screening mammogram for malignant neoplasm of breast       Relevant Orders   MM Digital Screening        Follow up plan: Return in about 1 month (around 09/27/2021) for Depression/Anxiety FU, BP Check.   LABORATORY TESTING:  - Pap smear: up to  date  IMMUNIZATIONS:   - Tdap: Tetanus vaccination status reviewed: last tetanus booster within 10 years. - Influenza: Refused - Pneumovax: Not applicable - Prevnar: Not applicable - COVID: Not applicable - HPV: Not applicable - Shingrix vaccine: Not applicable  SCREENING: -Mammogram: Ordered today  - Colonoscopy: Not applicable  - Bone Density: Not applicable  -Hearing Test: Not applicable  -Spirometry: Not applicable   PATIENT COUNSELING:   Advised to take 1 mg of folate supplement per day if capable of pregnancy.   Sexuality: Discussed sexually transmitted diseases, partner selection, use of condoms, avoidance of unintended pregnancy  and contraceptive alternatives.   Advised to avoid cigarette smoking.  I discussed with  the patient that most people either abstain from alcohol or drink within safe limits (<=14/week and <=4 drinks/occasion for males, <=7/weeks and <= 3 drinks/occasion for females) and that the risk for alcohol disorders and other health effects rises proportionally with the number of drinks per week and how often a drinker exceeds daily limits.  Discussed cessation/primary prevention of drug use and availability of treatment for abuse.   Diet: Encouraged to adjust caloric intake to maintain  or achieve ideal body weight, to reduce intake of dietary saturated fat and total fat, to limit sodium intake by avoiding high sodium foods and not adding table salt, and to maintain adequate dietary potassium and calcium preferably from fresh fruits, vegetables, and low-fat dairy products.    stressed the importance of regular exercise  Injury prevention: Discussed safety belts, safety helmets, smoke detector, smoking near bedding or upholstery.   Dental health: Discussed importance of regular tooth brushing, flossing, and dental visits.    NEXT PREVENTATIVE PHYSICAL DUE IN 1 YEAR. Return in about 1 month (around 09/27/2021) for Depression/Anxiety FU, BP Check.

## 2021-08-28 LAB — LIPID PANEL
Chol/HDL Ratio: 4.9 ratio — ABNORMAL HIGH (ref 0.0–4.4)
Cholesterol, Total: 220 mg/dL — ABNORMAL HIGH (ref 100–199)
HDL: 45 mg/dL (ref >39)
LDL Chol Calc (NIH): 136 mg/dL — ABNORMAL HIGH (ref 0–99)
Triglycerides: 218 mg/dL — ABNORMAL HIGH (ref 0–149)
VLDL Cholesterol Cal: 39 mg/dL (ref 5–40)

## 2021-08-28 LAB — CBC WITH DIFFERENTIAL/PLATELET
Basophils Absolute: 0 10*3/uL (ref 0.0–0.2)
Basos: 1 %
EOS (ABSOLUTE): 0.2 10*3/uL (ref 0.0–0.4)
Eos: 3 %
Hematocrit: 40.8 % (ref 34.0–46.6)
Hemoglobin: 13.9 g/dL (ref 11.1–15.9)
Immature Grans (Abs): 0 10*3/uL (ref 0.0–0.1)
Immature Granulocytes: 0 %
Lymphocytes Absolute: 1.8 10*3/uL (ref 0.7–3.1)
Lymphs: 26 %
MCH: 31 pg (ref 26.6–33.0)
MCHC: 34.1 g/dL (ref 31.5–35.7)
MCV: 91 fL (ref 79–97)
Monocytes Absolute: 0.4 10*3/uL (ref 0.1–0.9)
Monocytes: 6 %
Neutrophils Absolute: 4.4 10*3/uL (ref 1.4–7.0)
Neutrophils: 64 %
Platelets: 316 10*3/uL (ref 150–450)
RBC: 4.48 x10E6/uL (ref 3.77–5.28)
RDW: 12.6 % (ref 11.7–15.4)
WBC: 6.9 10*3/uL (ref 3.4–10.8)

## 2021-08-28 LAB — COMPREHENSIVE METABOLIC PANEL
ALT: 20 IU/L (ref 0–32)
AST: 17 IU/L (ref 0–40)
Albumin/Globulin Ratio: 1.9 (ref 1.2–2.2)
Albumin: 4.7 g/dL (ref 3.8–4.8)
Alkaline Phosphatase: 67 IU/L (ref 44–121)
BUN/Creatinine Ratio: 30 — ABNORMAL HIGH (ref 9–23)
BUN: 21 mg/dL (ref 6–24)
Bilirubin Total: 0.2 mg/dL (ref 0.0–1.2)
CO2: 24 mmol/L (ref 20–29)
Calcium: 9.2 mg/dL (ref 8.7–10.2)
Chloride: 100 mmol/L (ref 96–106)
Creatinine, Ser: 0.71 mg/dL (ref 0.57–1.00)
Globulin, Total: 2.5 g/dL (ref 1.5–4.5)
Glucose: 90 mg/dL (ref 70–99)
Potassium: 4.3 mmol/L (ref 3.5–5.2)
Sodium: 136 mmol/L (ref 134–144)
Total Protein: 7.2 g/dL (ref 6.0–8.5)
eGFR: 107 mL/min/{1.73_m2} (ref >59)

## 2021-08-28 LAB — HIV ANTIBODY (ROUTINE TESTING W REFLEX): HIV Screen 4th Generation wRfx: NONREACTIVE

## 2021-08-28 LAB — THYROID PANEL WITH TSH
Free Thyroxine Index: 1.9 (ref 1.2–4.9)
T3 Uptake Ratio: 28 % (ref 24–39)
T4, Total: 6.7 ug/dL (ref 4.5–12.0)
TSH: 1.3 u[IU]/mL (ref 0.450–4.500)

## 2021-08-28 LAB — HEPATITIS C ANTIBODY: Hep C Virus Ab: <0.1 s/co ratio (ref 0.0–0.9)

## 2021-08-30 ENCOUNTER — Encounter: Payer: Self-pay | Admitting: Nurse Practitioner

## 2021-08-30 NOTE — Progress Notes (Signed)
Please let patient know that her lab work shows that her cholesterol is elevated.  Her cholesterol is elevated .  I recommend a low fat diet and exercise 5x weekly for at least 30 minutes as she is able to tolerate it.  Other lab work looks good.  Thyroid labs are normal.  No concerns at this time.  Follow up as discussed.

## 2021-10-15 ENCOUNTER — Ambulatory Visit: Payer: Self-pay

## 2021-10-15 NOTE — Telephone Encounter (Signed)
? ? ?  Chief Complaint: Right shoulder pain ?Symptoms: Pain, up all night. Seen at [facility] ?Frequency: Last week ?Pertinent Negatives: Patient denies  ?Disposition: '[x]'$ ED /'[]'$ Urgent Care (no appt availability in office) / '[]'$ Appointment(In office/virtual)/ '[]'$  [place] [facility]/ '[]'$ Home Care/ '[]'$ Refused Recommended Disposition /'[]'$ [place] [facility]/ '[]'$  Follow-up with PCP ?Additional Notes: Pt. In severe pain, tearful.  ?Reason for Disposition ? [1] SEVERE pain AND [2] not improved 2 hours after pain medicine ? ?Answer Assessment - Initial Assessment Questions ?1. ONSET: "When did the pain start?" ?    Last week ?2. LOCATION: "Where is the pain located?" ?    Right shoulder ?3. PAIN: "How bad is the pain?" (Scale 1-10; or mild, moderate, severe) ?  - MILD (1-3): doesn't interfere with normal activities ?  - MODERATE (4-7): interferes with normal activities (e.g., work or school) or awakens from sleep ?  - SEVERE (8-10): excruciating pain, unable to do any normal activities, unable to move arm at all due to pain ?    Severe ?4. WORK OR EXERCISE: "Has there been any recent work or exercise that involved this part of the body?" ?    No ?5. CAUSE: "What do you think is causing the shoulder pain?" ?    Unsure ?6. OTHER SYMPTOMS: "Do you have any other symptoms?" (e.g., neck pain, swelling, rash, fever, numbness, weakness) ?    Neck and shoulder ?7. PREGNANCY: "Is there any chance you are pregnant?" "When was your last menstrual period?" ?    No ? ?Protocols used: Shoulder Pain-A-AH ? ?

## 2021-10-26 ENCOUNTER — Encounter: Payer: Self-pay | Admitting: Emergency Medicine

## 2021-10-26 ENCOUNTER — Inpatient Hospital Stay: Payer: BC Managed Care – PPO

## 2021-10-26 ENCOUNTER — Emergency Department: Payer: BC Managed Care – PPO

## 2021-10-26 ENCOUNTER — Inpatient Hospital Stay
Admission: EM | Admit: 2021-10-26 | Discharge: 2021-10-27 | DRG: 836 | Disposition: A | Discharge status: Other Acute Inpatient Hospital | Payer: BC Managed Care – PPO | Attending: Obstetrics and Gynecology | Admitting: Obstetrics and Gynecology

## 2021-10-26 ENCOUNTER — Other Ambulatory Visit: Payer: Self-pay

## 2021-10-26 DIAGNOSIS — Z87891 Personal history of nicotine dependence: Secondary | ICD-10-CM

## 2021-10-26 DIAGNOSIS — Z8349 Family history of other endocrine, nutritional and metabolic diseases: Secondary | ICD-10-CM

## 2021-10-26 DIAGNOSIS — Z79899 Other long term (current) drug therapy: Secondary | ICD-10-CM | POA: Diagnosis not present

## 2021-10-26 DIAGNOSIS — J329 Chronic sinusitis, unspecified: Secondary | ICD-10-CM | POA: Diagnosis present

## 2021-10-26 DIAGNOSIS — L089 Local infection of the skin and subcutaneous tissue, unspecified: Secondary | ICD-10-CM

## 2021-10-26 DIAGNOSIS — M25511 Pain in right shoulder: Secondary | ICD-10-CM | POA: Diagnosis present

## 2021-10-26 DIAGNOSIS — Z23 Encounter for immunization: Secondary | ICD-10-CM

## 2021-10-26 DIAGNOSIS — Z882 Allergy status to sulfonamides status: Secondary | ICD-10-CM | POA: Diagnosis not present

## 2021-10-26 DIAGNOSIS — F419 Anxiety disorder, unspecified: Secondary | ICD-10-CM | POA: Diagnosis present

## 2021-10-26 DIAGNOSIS — W540XXA Bitten by dog, initial encounter: Secondary | ICD-10-CM | POA: Diagnosis not present

## 2021-10-26 DIAGNOSIS — G8929 Other chronic pain: Secondary | ICD-10-CM | POA: Diagnosis present

## 2021-10-26 DIAGNOSIS — R7401 Elevation of levels of liver transaminase levels: Secondary | ICD-10-CM | POA: Diagnosis present

## 2021-10-26 DIAGNOSIS — F411 Generalized anxiety disorder: Secondary | ICD-10-CM | POA: Diagnosis present

## 2021-10-26 DIAGNOSIS — F32A Depression, unspecified: Secondary | ICD-10-CM | POA: Diagnosis present

## 2021-10-26 DIAGNOSIS — S71152A Open bite, left thigh, initial encounter: Secondary | ICD-10-CM | POA: Diagnosis present

## 2021-10-26 DIAGNOSIS — I10 Essential (primary) hypertension: Secondary | ICD-10-CM | POA: Diagnosis present

## 2021-10-26 DIAGNOSIS — D696 Thrombocytopenia, unspecified: Secondary | ICD-10-CM | POA: Diagnosis present

## 2021-10-26 DIAGNOSIS — Z881 Allergy status to other antibiotic agents status: Secondary | ICD-10-CM | POA: Diagnosis not present

## 2021-10-26 DIAGNOSIS — Z87442 Personal history of urinary calculi: Secondary | ICD-10-CM | POA: Diagnosis not present

## 2021-10-26 DIAGNOSIS — C9200 Acute myeloblastic leukemia, not having achieved remission: Principal | ICD-10-CM | POA: Diagnosis present

## 2021-10-26 DIAGNOSIS — C9100 Acute lymphoblastic leukemia not having achieved remission: Secondary | ICD-10-CM

## 2021-10-26 DIAGNOSIS — Z20822 Contact with and (suspected) exposure to covid-19: Secondary | ICD-10-CM | POA: Diagnosis present

## 2021-10-26 DIAGNOSIS — J321 Chronic frontal sinusitis: Secondary | ICD-10-CM | POA: Diagnosis present

## 2021-10-26 DIAGNOSIS — R7989 Other specified abnormal findings of blood chemistry: Secondary | ICD-10-CM | POA: Diagnosis present

## 2021-10-26 DIAGNOSIS — Z818 Family history of other mental and behavioral disorders: Secondary | ICD-10-CM | POA: Diagnosis not present

## 2021-10-26 DIAGNOSIS — C9500 Acute leukemia of unspecified cell type not having achieved remission: Secondary | ICD-10-CM

## 2021-10-26 LAB — URINALYSIS, COMPLETE (UACMP) WITH MICROSCOPIC
Bilirubin Urine: NEGATIVE
Bilirubin Urine: NEGATIVE
Glucose, UA: NEGATIVE mg/dL
Glucose, UA: NEGATIVE mg/dL
Hgb urine dipstick: NEGATIVE
Hgb urine dipstick: NEGATIVE
Ketones, ur: NEGATIVE mg/dL
Ketones, ur: NEGATIVE mg/dL
Leukocytes,Ua: NEGATIVE
Leukocytes,Ua: NEGATIVE
Nitrite: NEGATIVE
Nitrite: NEGATIVE
Protein, ur: 30 mg/dL — AB
Protein, ur: NEGATIVE mg/dL
Specific Gravity, Urine: 1.023 (ref 1.005–1.030)
Specific Gravity, Urine: 1.021 (ref 1.005–1.030)
pH: 5 (ref 5.0–8.0)
pH: 6 (ref 5.0–8.0)

## 2021-10-26 LAB — CBC WITH DIFFERENTIAL/PLATELET
Band Neutrophils: 0 %
Basophils Absolute: 0.2 10*3/uL — ABNORMAL HIGH (ref 0.0–0.1)
Basophils Relative: 0 %
Eosinophils Absolute: 0.1 10*3/uL (ref 0.0–0.5)
Eosinophils Relative: 0 %
HCT: 31.9 % — ABNORMAL LOW (ref 36.0–46.0)
Hemoglobin: 10.7 g/dL — ABNORMAL LOW (ref 12.0–15.0)
Lymphocytes Relative: 68 %
Lymphs Abs: 23.9 10*3/uL — ABNORMAL HIGH (ref 0.7–4.0)
MCH: 29.7 pg (ref 26.0–34.0)
MCHC: 33.5 g/dL (ref 30.0–36.0)
MCV: 88.6 fL (ref 80.0–100.0)
Monocytes Absolute: 8.5 10*3/uL — ABNORMAL HIGH (ref 0.1–1.0)
Monocytes Relative: 24 %
Neutro Abs: 1.9 10*3/uL (ref 1.7–7.7)
Neutrophils Relative %: 5 %
Platelets: 8 10*3/uL — CL (ref 150–400)
RBC: 3.6 MIL/uL — ABNORMAL LOW (ref 3.87–5.11)
RDW: 14.1 % (ref 11.5–15.5)
WBC Morphology: ABNORMAL
WBC: 35.1 10*3/uL — ABNORMAL HIGH (ref 4.0–10.5)
nRBC: 0.2 % (ref 0.0–0.2)

## 2021-10-26 LAB — BASIC METABOLIC PANEL
Anion gap: 10 (ref 5–15)
BUN: 17 mg/dL (ref 6–20)
CO2: 23 mmol/L (ref 22–32)
Calcium: 9.3 mg/dL (ref 8.9–10.3)
Chloride: 105 mmol/L (ref 98–111)
Creatinine, Ser: 0.65 mg/dL (ref 0.44–1.00)
GFR, Estimated: >60 mL/min (ref >60)
Glucose, Bld: 111 mg/dL — ABNORMAL HIGH (ref 70–99)
Potassium: 3.9 mmol/L (ref 3.5–5.1)
Sodium: 138 mmol/L (ref 135–145)

## 2021-10-26 LAB — VITAMIN B12: Vitamin B-12: 652 pg/mL (ref 180–914)

## 2021-10-26 LAB — RESP PANEL BY RT-PCR (FLU A&B, COVID) ARPGX2
Influenza A by PCR: NEGATIVE
Influenza B by PCR: NEGATIVE
SARS Coronavirus 2 by RT PCR: NEGATIVE

## 2021-10-26 LAB — PROTIME-INR
INR: 1 (ref 0.8–1.2)
Prothrombin Time: 13.5 seconds (ref 11.4–15.2)

## 2021-10-26 LAB — CBC
HCT: 35.9 % — ABNORMAL LOW (ref 36.0–46.0)
Hemoglobin: 12.2 g/dL (ref 12.0–15.0)
MCH: 29.6 pg (ref 26.0–34.0)
MCHC: 34 g/dL (ref 30.0–36.0)
MCV: 87.1 fL (ref 80.0–100.0)
Platelets: 8 10*3/uL — CL (ref 150–400)
RBC: 4.12 MIL/uL (ref 3.87–5.11)
RDW: 14 % (ref 11.5–15.5)
WBC: 28 10*3/uL — ABNORMAL HIGH (ref 4.0–10.5)
nRBC: 0.1 % (ref 0.0–0.2)

## 2021-10-26 LAB — HEPATIC FUNCTION PANEL
ALT: 201 U/L — ABNORMAL HIGH (ref 0–44)
AST: 113 U/L — ABNORMAL HIGH (ref 15–41)
Albumin: 3.7 g/dL (ref 3.5–5.0)
Alkaline Phosphatase: 233 U/L — ABNORMAL HIGH (ref 38–126)
Bilirubin, Direct: 0.2 mg/dL (ref 0.0–0.2)
Indirect Bilirubin: 0.8 mg/dL (ref 0.3–0.9)
Total Bilirubin: 1 mg/dL (ref 0.3–1.2)
Total Protein: 6.8 g/dL (ref 6.5–8.1)

## 2021-10-26 LAB — IRON AND TIBC
Iron: 173 ug/dL — ABNORMAL HIGH (ref 28–170)
Saturation Ratios: 69 % — ABNORMAL HIGH (ref 10.4–31.8)
TIBC: 251 ug/dL (ref 250–450)
UIBC: 78 ug/dL

## 2021-10-26 LAB — LACTATE DEHYDROGENASE: LDH: 1646 U/L — ABNORMAL HIGH (ref 98–192)

## 2021-10-26 LAB — TECHNOLOGIST SMEAR REVIEW
Plt Morphology: NONE SEEN
WBC MORPHOLOGY: ABNORMAL

## 2021-10-26 LAB — LACTIC ACID, PLASMA: Lactic Acid, Venous: 1 mmol/L (ref 0.5–1.9)

## 2021-10-26 LAB — POC URINE PREG, ED: Preg Test, Ur: NEGATIVE

## 2021-10-26 LAB — FIBRINOGEN: Fibrinogen: 357 mg/dL (ref 210–475)

## 2021-10-26 LAB — FOLATE: Folate: 11 ng/mL (ref >5.9)

## 2021-10-26 LAB — FERRITIN: Ferritin: 847 ng/mL — ABNORMAL HIGH (ref 11–307)

## 2021-10-26 LAB — ABO/RH
ABO/RH(D): O POS
ABO/RH(D): O POS

## 2021-10-26 LAB — SAVE SMEAR(SSMR), FOR PROVIDER SLIDE REVIEW

## 2021-10-26 LAB — HIV ANTIBODY (ROUTINE TESTING W REFLEX): HIV Screen 4th Generation wRfx: NONREACTIVE

## 2021-10-26 MED ORDER — SODIUM CHLORIDE 0.9% IV SOLUTION
Freq: Once | INTRAVENOUS | Status: DC
  Filled 2021-10-26: qty 250

## 2021-10-26 MED ORDER — SODIUM CHLORIDE 0.9% FLUSH
3.0000 mL | Freq: Two times a day (BID) | INTRAVENOUS | Status: DC
  Administered 2021-10-26 – 2021-10-27 (×3): 3 mL via INTRAVENOUS

## 2021-10-26 MED ORDER — ACETAMINOPHEN 325 MG PO TABS
650.0000 mg | ORAL_TABLET | Freq: Four times a day (QID) | ORAL | Status: DC | PRN
  Administered 2021-10-26 – 2021-10-27 (×3): 650 mg via ORAL
  Filled 2021-10-26 (×3): qty 2

## 2021-10-26 MED ORDER — ESCITALOPRAM OXALATE 10 MG PO TABS
10.0000 mg | ORAL_TABLET | Freq: Every day | ORAL | Status: DC
Start: 2021-10-27 — End: 2021-10-27
  Administered 2021-10-27: 10 mg via ORAL
  Filled 2021-10-26: qty 1

## 2021-10-26 MED ORDER — SODIUM CHLORIDE 0.9 % IV SOLN: 250.0000 mL | INTRAVENOUS | Status: DC | PRN

## 2021-10-26 MED ORDER — AMPICILLIN-SULBACTAM SODIUM 3 (2-1) G IJ SOLR
3.0000 g | Freq: Once | INTRAMUSCULAR | Status: AC
Start: 2021-10-26 — End: 2021-10-26
  Administered 2021-10-26: 3 g via INTRAVENOUS
  Filled 2021-10-26: qty 8

## 2021-10-26 MED ORDER — SODIUM CHLORIDE 0.9% FLUSH: 3.0000 mL | INTRAVENOUS | Status: DC | PRN

## 2021-10-26 MED ORDER — SODIUM CHLORIDE 0.9 % IV SOLN
1000.0000 mL | Freq: Once | INTRAVENOUS | Status: AC
Start: 2021-10-26 — End: 2021-10-26
  Administered 2021-10-26: 1000 mL via INTRAVENOUS

## 2021-10-26 MED ORDER — CYCLOBENZAPRINE HCL 10 MG PO TABS
5.0000 mg | ORAL_TABLET | Freq: Every day | ORAL | Status: DC
  Administered 2021-10-27: 5 mg via ORAL
  Filled 2021-10-26: qty 1

## 2021-10-26 NOTE — Consult Note (Signed)
?[place]  ?[phone] [phone] ? ?ID: Kathryn Black OB: Nov 29, 1975  MR#: [id number]  CSN#:[id number] ? ?Patient Care Team: ?Jon Billings, NP as PCP - General (Nurse Practitioner) ? ?CHIEF COMPLAINT: Thrombocytopenia, leukocytosis. ? ?INTERVAL HISTORY: Patient is a 46 year old female who suffered a dog bite approximately 1 week ago and was subsequently placed on Augmentin.  She noted increased lightheadedness, weakness, and fatigue throughout the week.  Upon evaluation in the emergency room she was noted to have an elevated white blood cell count and a significantly decreased platelet count.  She otherwise feels well.  She has no other neurologic complaints.  She denies any fevers.  She has a good appetite and denies weight loss.  She has no chest pain, shortness of breath, cough, or hemoptysis.  She has some mild nausea, but no vomiting, constipation, or diarrhea.  She has no urinary complaints.  Patient offers no further specific complaints today. ? ?REVIEW OF SYSTEMS:   ?Review of Systems  ?Constitutional:  Positive for malaise/fatigue. Negative for fever and weight loss.  ?Respiratory: Negative.  Negative for cough, hemoptysis and wheezing.   ?Cardiovascular: Negative.  Negative for chest pain and leg swelling.  ?Gastrointestinal: Negative.  Negative for abdominal pain.  ?Genitourinary: Negative.  Negative for dysuria.  ?Musculoskeletal: Negative.  Negative for back pain.  ?Skin: Negative.  Negative for rash.  ?Neurological:  Positive for weakness. Negative for dizziness, focal weakness and headaches.  ?Psychiatric/Behavioral: Negative.  The patient is not nervous/anxious.   ? ?As per HPI. Otherwise, a complete review of systems is negative. ? ?PAST MEDICAL HISTORY: ?Past Medical History:  ?Diagnosis Date  ? Anxiety   ? no meds  ? Asthma   ? childhood-allergy induced  ? Depression   ? no meds  ? Headache(784.0)   ? Hypertension   ? Kidney stone   ? ? ?PAST SURGICAL  HISTORY: ?Past Surgical History:  ?Procedure Laterality Date  ? BREAST BIOPSY  04/01/1989  ? in office procedure under local  ? CYSTOSCOPY WITH URETEROSCOPY  11/23/2011  ? Procedure: CYSTOSCOPY WITH URETEROSCOPY;  Surgeon: Franchot Gallo, MD;  Location: [facility];  Service: Urology;  Laterality: Right;  WITH STONE obtained  ? KIDNEY STONE SURGERY    ? LEEP    ? in office procedure  ? ? ?FAMILY HISTORY: ?Family History  ?Problem Relation Age of Onset  ? Depression Sister   ? Hashimoto's thyroiditis Sister   ? ? ?ADVANCED DIRECTIVES (Y/N):  @ADVDIR @ ? ?HEALTH MAINTENANCE: ?Social History  ? ?Tobacco Use  ? Smoking status: Former  ?  Packs/day: 0.25  ?  Types: Cigarettes  ? Smokeless tobacco: Never  ?Vaping Use  ? Vaping Use: Former  ?Substance Use Topics  ? Alcohol use: Yes  ?  Comment: occ  ? Drug use: No  ? ? ? Colonoscopy: ? PAP: ? Bone density: ? Lipid panel: ? ?Allergies  ?Allergen Reactions  ? Sulfa Antibiotics Anaphylaxis  ? Erythromycin Hives  ? Zithromax [Azithromycin]   ? ? ?Current Facility-Administered Medications  ?Medication Dose Route Frequency Provider Last Rate Last Admin  ? Ampicillin-Sulbactam (UNASYN) 3 g in sodium chloride 0.9 % 100 mL IVPB  3 g Intravenous Once Lavonia Drafts, MD 200 mL/hr at 10/26/21 1310 3 g at 10/26/21 1310  ? ?Current Outpatient Medications  ?Medication Sig Dispense Refill  ? cyclobenzaprine (FLEXERIL) 5 MG tablet Take 1 tablet (5 mg total) by mouth at bedtime. 30 tablet 0  ? escitalopram (LEXAPRO) 10 MG  tablet Take 1 tablet (10 mg total) by mouth daily. 90 tablet 1  ? losartan (COZAAR) 50 MG tablet Take 1 tablet (50 mg total) by mouth daily. 90 tablet 1  ? ? ?OBJECTIVE: ?Vitals:  ? 10/26/21 0922 10/26/21 1245  ?BP: 126/80 (!) 143/113  ?Pulse: 95 86  ?Resp: 20 18  ?Temp: 98.9 ?F (37.2 ?C)   ?SpO2: 97% 95%  ?   Body mass index is 28.04 kg/m?Marland Kitchen    ECOG FS:0 - Asymptomatic ? ?General: Well-developed, well-nourished, no acute distress. ?Eyes: Pink conjunctiva,  anicteric sclera. ?HEENT: Normocephalic, moist mucous membranes. ?Lungs: No audible wheezing or coughing. ?Heart: Regular rate and rhythm. ?Abdomen: Soft, nontender, no obvious distention. ?Musculoskeletal: No edema, cyanosis, or clubbing. ?Neuro: Alert, answering all questions appropriately. Cranial nerves grossly intact. ?Skin: No rashes or petechiae noted. ?Psych: Normal affect. ?Lymphatics: No cervical, calvicular, axillary or inguinal LAD. ? ? ?LAB RESULTS: ? ?Lab Results  ?Component Value Date  ? NA 138 10/26/2021  ? K 3.9 10/26/2021  ? CL 105 10/26/2021  ? CO2 23 10/26/2021  ? GLUCOSE 111 (H) 10/26/2021  ? BUN 17 10/26/2021  ? CREATININE 0.65 10/26/2021  ? CALCIUM 9.3 10/26/2021  ? PROT 6.8 10/26/2021  ? ALBUMIN 3.7 10/26/2021  ? AST 113 (H) 10/26/2021  ? ALT 201 (H) 10/26/2021  ? ALKPHOS 233 (H) 10/26/2021  ? BILITOT 1.0 10/26/2021  ? GFRNONAA >60 10/26/2021  ? GFRAA >60 11/04/2019  ? ? ?Lab Results  ?Component Value Date  ? WBC 28.0 (H) 10/26/2021  ? NEUTROABS 4.4 08/27/2021  ? HGB 12.2 10/26/2021  ? HCT 35.9 (L) 10/26/2021  ? MCV 87.1 10/26/2021  ? PLT 8 (LL) 10/26/2021  ? ? ? ?STUDIES: ?US Abdomen Limited RUQ (LIVER/GB) ? ?Result Date: 10/26/2021 ?CLINICAL DATA:  Upper abdominal pain EXAM: ULTRASOUND ABDOMEN LIMITED RIGHT UPPER QUADRANT COMPARISON:  CT 01/21/2016 FINDINGS: Gallbladder: 3.7 mm polyp along the anterior gallbladder wall. No follow-up recommended given this appearance. No gallbladder wall thickening. No stone. No Murphy sign. Common bile duct: Diameter: 4 mm common normal. Liver: Slightly increased echogenicity of the liver suggesting mild steatosis. No focal lesion or ductal dilatation. Portal vein is patent on color Doppler imaging with normal direction of blood flow towards the liver. Other: None. IMPRESSION: No evidence of gallstone or cholecystitis. 3.7 mm gallbladder polyp. No follow-up recommended for this low suspicion abnormality. Mild diffuse fatty change of the liver. Electronically  Signed   By: Nelson Chimes M.D.   On: 10/26/2021 11:34   ? ?ASSESSMENT: Thrombocytopenia, leukocytosis. ? ?PLAN:   ? ?1.  Thrombocytopenia: Patient's most recent platelet count is 8 which was confirmed on repeat CBC approximately 6 hours later.  Unclear etiology, but possibly secondary to recent dog bite or even Augmentin.  Augmentin has been discontinued.  No splenomegaly noted on ultrasound.  Agree with transfusion of 1 unit platelets.  Will initiate lab work-up.  Patient does not require bone marrow biopsy at this time, but would consider if no improvement of her platelets over the next several days.  Continue to monitor daily CBC. ?2.  Leukocytosis: Likely reactive.  Has trended up slightly to 35 since admission.  Continue current antibiotics.  Monitor. ?3.  Anemia: Patient's hemoglobin has declined to 10.7.  Consider bone marrow biopsy as above.  Laboratory work-up as above. ?4.  Transaminitis: Ultrasound results as above.  No obvious anatomic abnormality of liver.  Continue to monitor closely. ? ?Appreciate consult, will follow. ? ? ?Lloyd Huger, MD  10/26/2021 1:21 PM ? ? ? ? ?

## 2021-10-26 NOTE — ED Notes (Signed)
ED TO INPATIENT HANDOFF REPORT  ED Nurse Name and Phone #: Louie Casa Name/Age/Gender Darron Doom 46 y.o. female Room/Bed: ED36A/ED36A  Code Status   Code Status: Full Code  Home/SNF/Other Home Patient oriented to: self, place, time, and situation Is this baseline? Yes   Triage Complete: Triage complete  Chief Complaint Thrombocytopenia (HCC) [D69.6]  Triage Note Pt to ED via POV with c/o weakness and SHOB for the last week. She is also having a feeling of being constipated but went this am. Her mother told her that she is having signs of appendicitis and to come to the ED to be checked. Pt is having bilat abd pain with nausea. Pt has SEVERAL medical complaints.    Allergies Allergies  Allergen Reactions   Sulfa Antibiotics Anaphylaxis   Erythromycin Hives   Zithromax [Azithromycin]     Level of Care/Admitting Diagnosis ED Disposition     ED Disposition  Admit   Condition  --   Comment  Hospital Area: [facility] [[id number]]  Level of Care: Med-Surg [16]  Covid Evaluation: Symptomatic Person Under Investigation (PUI) or recent exposure (last 10 days) *Testing Required*  Diagnosis: Thrombocytopenia [facility]) [[id number]]  Admitting Physician: Erenest Blank  Attending Physician: Kathrynn Running [[id number]]  Estimated length of stay: past midnight tomorrow  Certification:: I certify this patient will need inpatient services for at least 2 midnights          B Medical/Surgery History Past Medical History:  Diagnosis Date   Anxiety    no meds   Asthma    childhood-allergy induced   Depression    no meds   Headache(784.0)    Hypertension    Kidney stone    Past Surgical History:  Procedure Laterality Date   BREAST BIOPSY  04/01/1989   in office procedure under local   CYSTOSCOPY WITH URETEROSCOPY  11/23/2011   Procedure: CYSTOSCOPY WITH URETEROSCOPY;  Surgeon: Marcine Matar, MD;  Location: [facility];   Service: Urology;  Laterality: Right;  WITH STONE obtained   KIDNEY STONE SURGERY     LEEP     in office procedure     A IV Location/Drains/Wounds Patient Lines/Drains/Airways Status     Active Line/Drains/Airways     Name Placement date Placement time Site Days   Peripheral IV 10/26/21 20 G Left Antecubital 10/26/21  1017  Antecubital  less than 1   Incision 11/23/11 Perineum Right 11/23/11  0923  -- 3625            Intake/Output Last 24 hours  Intake/Output Summary (Last 24 hours) at 10/26/2021 1925 Last data filed at 10/26/2021 1627 Gross per 24 hour  Intake 1100 ml  Output --  Net 1100 ml    Labs/Imaging Results for orders placed or performed during the hospital encounter of 10/26/21 (from the past 48 hour(s))  Basic metabolic panel     Status: Abnormal   Collection Time: 10/26/21  9:24 AM  Result Value Ref Range   Sodium 138 135 - 145 mmol/L   Potassium 3.9 3.5 - 5.1 mmol/L   Chloride 105 98 - 111 mmol/L   CO2 23 22 - 32 mmol/L   Glucose, Bld 111 (H) 70 - 99 mg/dL    Comment: Glucose reference range applies only to samples taken after fasting for at least 8 hours.   BUN 17 6 - 20 mg/dL   Creatinine, Ser 9.14 0.44 - 1.00 mg/dL   Calcium 9.3 8.9 -  10.3 mg/dL   GFR, Estimated >82 >95 mL/min    Comment: (NOTE) Calculated using the CKD-EPI Creatinine Equation (2021)    Anion gap 10 5 - 15    Comment: Performed at [facility], [address] [place], [place] [address]  CBC     Status: None   Collection Time: 10/26/21  9:24 AM  Result Value Ref Range   WBC SPECIMEN CLOTTED 4.0 - 10.5 K/uL    Comment: notified Laren Boom RN at 1000 AT   RBC SPECIMEN CLOTTED 3.87 - 5.11 MIL/uL    Comment: notified Laren Boom RN at 1000 AT   Hemoglobin SPECIMEN CLOTTED 12.0 - 15.0 g/dL    Comment: notified Laren Boom RN at 1000 AT   HCT SPECIMEN CLOTTED 36.0 - 46.0 %    Comment: notified Laren Boom RN at 1000 AT   MCV SPECIMEN CLOTTED 80.0 -  100.0 fL    Comment: notified Laren Boom RN at 1000 AT   [facility] [place] SPECIMEN CLOTTED 26.0 - 34.0 pg    Comment: notified Laren Boom RN at 1000 AT   MCHC SPECIMEN CLOTTED 30.0 - 36.0 g/dL    Comment: notified Laren Boom RN at 1000 AT   RDW SPECIMEN CLOTTED 11.5 - 15.5 %    Comment: notified Laren Boom RN at 1000 AT   Platelets SPECIMEN CLOTTED 150 - 400 K/uL    Comment: notified Laren Boom RN at 1000 AT   nRBC SPECIMEN CLOTTED 0.0 - 0.2 %    Comment: notified Laren Boom RN at 1000 AT Performed at [facility], [address] [place], [place] [address]   CBC     Status: Abnormal   Collection Time: 10/26/21 10:07 AM  Result Value Ref Range   WBC 28.0 (H) 4.0 - 10.5 K/uL   RBC 4.12 3.87 - 5.11 MIL/uL   Hemoglobin 12.2 12.0 - 15.0 g/dL   HCT 46.9 (L) 62.9 - 52.8 %   MCV 87.1 80.0 - 100.0 fL   MCH 29.6 26.0 - 34.0 pg   MCHC 34.0 30.0 - 36.0 g/dL   RDW 41.3 24.4 - 01.0 %   Platelets 8 (LL) 150 - 400 K/uL    Comment: Immature Platelet Fraction may be clinically indicated, consider ordering this additional test [id number] PLATELET COUNT CONFIRMED BY SMEAR THIS CRITICAL RESULT HAS VERIFIED AND BEEN CALLED TO LINDA MCLAMB, RN BY GEOFFREY MCADOO ON 03 28 2023 AT 1107, AND HAS BEEN READ BACK.     nRBC 0.1 0.0 - 0.2 %    Comment: Performed at [facility], [address] [place], [place] [address]  Hepatic function panel     Status: Abnormal   Collection Time: 10/26/21 10:07 AM  Result Value Ref Range   Total Protein 6.8 6.5 - 8.1 g/dL   Albumin 3.7 3.5 - 5.0 g/dL   AST 425 (H) 15 - 41 U/L   ALT 201 (H) 0 - 44 U/L   Alkaline Phosphatase 233 (H) 38 - 126 U/L   Total Bilirubin 1.0 0.3 - 1.2 mg/dL   Bilirubin, Direct 0.2 0.0 - 0.2 mg/dL   Indirect Bilirubin 0.8 0.3 - 0.9 mg/dL    Comment: Performed at [facility], [address] [place], [place] [address]  Urinalysis, Complete w Microscopic     Status: Abnormal   Collection  Time: 10/26/21 10:07 AM  Result Value Ref Range   Color, Urine YELLOW (A) YELLOW   APPearance HAZY (A) CLEAR   Specific  Gravity, Urine 1.023 1.005 - 1.030   pH 5.0 5.0 - 8.0   Glucose, UA NEGATIVE NEGATIVE mg/dL   Hgb urine dipstick NEGATIVE NEGATIVE   Bilirubin Urine NEGATIVE NEGATIVE   Ketones, ur NEGATIVE NEGATIVE mg/dL   Protein, ur 30 (A) NEGATIVE mg/dL   Nitrite NEGATIVE NEGATIVE   Leukocytes,Ua NEGATIVE NEGATIVE   RBC / HPF 0-5 0 - 5 RBC/hpf   WBC, UA 0-5 0 - 5 WBC/hpf   Bacteria, UA RARE (A) NONE SEEN   Squamous Epithelial / LPF 6-10 0 - 5   Mucus PRESENT    Hyaline Casts, UA PRESENT     Comment: Performed at [facility], [address] [place], [place] [address]  Lactic acid, plasma     Status: None   Collection Time: 10/26/21 12:23 PM  Result Value Ref Range   Lactic Acid, Venous 1.0 0.5 - 1.9 mmol/L    Comment: Performed at [facility], [address] [place], [place] [address]  POC urine preg, ED     Status: None   Collection Time: 10/26/21  1:18 PM  Result Value Ref Range   Preg Test, Ur Negative Negative  Resp Panel by RT-PCR (Flu A&B, Covid) Nasopharyngeal Swab     Status: None   Collection Time: 10/26/21  5:09 PM   Specimen: Nasopharyngeal Swab; Nasopharyngeal(NP) swabs in vial transport medium  Result Value Ref Range   SARS Coronavirus 2 by RT PCR NEGATIVE NEGATIVE    Comment: (NOTE) SARS-CoV-2 target nucleic acids are NOT DETECTED.  The SARS-CoV-2 RNA is generally detectable in upper respiratory specimens during the acute phase of infection. The lowest concentration of SARS-CoV-2 viral copies this assay can detect is 138 copies/mL. A negative result does not preclude SARS-Cov-2 infection and should not be used as the sole basis for treatment or other patient management decisions. A negative result may occur with  improper specimen collection/handling, submission of specimen other than nasopharyngeal swab, presence of viral  mutation(s) within the areas targeted by this assay, and inadequate number of viral copies(<138 copies/mL). A negative result must be combined with clinical observations, patient history, and epidemiological information. The expected result is Negative.  Fact Sheet for Patients:  BloggerCourse.com  Fact Sheet for Healthcare Providers:  SeriousBroker.it  This test is no t yet approved or cleared by the [place] FDA and  has been authorized for detection and/or diagnosis of SARS-CoV-2 by FDA under an Emergency Use Authorization (EUA). This EUA will remain  in effect (meaning this test can be used) for the duration of the COVID-19 declaration under Section 564(b)(1) of the Act, 21 U.S.C.section 360bbb-3(b)(1), unless the authorization is terminated  or revoked sooner.       Influenza A by PCR NEGATIVE NEGATIVE   Influenza B by PCR NEGATIVE NEGATIVE    Comment: (NOTE) The Xpert Xpress SARS-CoV-2/FLU/RSV plus assay is intended as an aid in the diagnosis of influenza from Nasopharyngeal swab specimens and should not be used as a sole basis for treatment. Nasal washings and aspirates are unacceptable for Xpert Xpress SARS-CoV-2/FLU/RSV testing.  Fact Sheet for Patients: BloggerCourse.com  Fact Sheet for Healthcare Providers: SeriousBroker.it  This test is not yet approved or cleared by the [place] FDA and has been authorized for detection and/or diagnosis of SARS-CoV-2 by FDA under an Emergency Use Authorization (EUA). This EUA will remain in effect (meaning this test can be used) for the duration of the COVID-19 declaration under Section 564(b)(1) of the Act, 21 U.S.C.  section 360bbb-3(b)(1), unless the authorization is terminated or revoked.  Performed at [facility], [address] [place], [place] [address]   Urinalysis, Complete w Microscopic      Status: Abnormal   Collection Time: 10/26/21  5:09 PM  Result Value Ref Range   Color, Urine YELLOW (A) YELLOW   APPearance CLEAR (A) CLEAR   Specific Gravity, Urine 1.021 1.005 - 1.030   pH 6.0 5.0 - 8.0   Glucose, UA NEGATIVE NEGATIVE mg/dL   Hgb urine dipstick NEGATIVE NEGATIVE   Bilirubin Urine NEGATIVE NEGATIVE   Ketones, ur NEGATIVE NEGATIVE mg/dL   Protein, ur NEGATIVE NEGATIVE mg/dL   Nitrite NEGATIVE NEGATIVE   Leukocytes,Ua NEGATIVE NEGATIVE   RBC / HPF 0-5 0 - 5 RBC/hpf   WBC, UA 0-5 0 - 5 WBC/hpf   Bacteria, UA RARE (A) NONE SEEN   Squamous Epithelial / LPF 0-5 0 - 5   Mucus PRESENT     Comment: Performed at [facility], [address] [place], [place] [address]  Protime-INR     Status: None   Collection Time: 10/26/21  5:09 PM  Result Value Ref Range   Prothrombin Time 13.5 11.4 - 15.2 seconds   INR 1.0 0.8 - 1.2    Comment: (NOTE) INR goal varies based on device and disease states. Performed at [facility], [address] [place], [place] [address]   Fibrinogen     Status: None   Collection Time: 10/26/21  5:09 PM  Result Value Ref Range   Fibrinogen 357 210 - 475 mg/dL    Comment: (NOTE) Fibrinogen results may be underestimated in patients receiving thrombolytic therapy. Performed at [facility], [address] [place], [place] [address]   Save Smear     Status: None   Collection Time: 10/26/21  5:09 PM  Result Value Ref Range   Smear Review SMEAR STAINED AND AVAILABLE FOR REVIEW     Comment: Performed at [facility], [address] [place], [place] [address]  CBC with Differential/Platelet     Status: Abnormal (Preliminary result)   Collection Time: 10/26/21  5:09 PM  Result Value Ref Range   WBC 35.1 (H) 4.0 - 10.5 K/uL    Comment: REPEATED TO VERIFY WHITE COUNT CONFIRMED ON SMEAR    RBC 3.60 (L) 3.87 - 5.11 MIL/uL   Hemoglobin 10.7 (L) 12.0 - 15.0 g/dL   HCT 69.6 (L) 29.5 - 28.4 %   MCV 88.6 80.0  - 100.0 fL   MCH 29.7 26.0 - 34.0 pg   MCHC 33.5 30.0 - 36.0 g/dL   RDW 13.2 44.0 - 10.2 %   Platelets 8 (LL) 150 - 400 K/uL    Comment: Immature Platelet Fraction may be clinically indicated, consider ordering this additional test [id number] CONSISTENT WITH PREVIOUS RESULT REPEATED TO VERIFY THIS CRITICAL RESULT HAS VERIFIED AND BEEN CALLED TO BELLY OSORIO BY SAINVIL SAINVILUS ON 03 28 2023 AT 1916, AND HAS BEEN READ BACK.     nRBC 0.2 0.0 - 0.2 %    Comment: Performed at [facility], [address] [place], [place] [address]   Neutrophils Relative % PENDING %   Neutro Abs PENDING 1.7 - 7.7 K/uL   Band Neutrophils PENDING %   Lymphocytes Relative PENDING %   Lymphs Abs PENDING 0.7 - 4.0 K/uL   Monocytes Relative PENDING %   Monocytes Absolute PENDING 0.1 - 1.0 K/uL   Eosinophils Relative PENDING %  Eosinophils Absolute PENDING 0.0 - 0.5 K/uL   Basophils Relative PENDING %   Basophils Absolute PENDING 0.0 - 0.1 K/uL   WBC Morphology PENDING    RBC Morphology PENDING    Smear Review PENDING    Other PENDING %   nRBC PENDING 0 /100 WBC   Metamyelocytes Relative PENDING %   Myelocytes PENDING %   Promyelocytes Relative PENDING %   Blasts PENDING %   Immature Granulocytes PENDING %   Abs Immature Granulocytes PENDING 0.00 - 0.07 K/uL   DG Shoulder Right  Result Date: 10/26/2021 CLINICAL DATA:  Provided history: Right shoulder pain. No history of trauma. Shortness of breath. Weakness. EXAM: RIGHT SHOULDER - 2+ VIEW COMPARISON:  None FINDINGS: There is normal bony alignment. No evidence of acute osseous or articular abnormality. The joint spaces are maintained. IMPRESSION: No evidence of acute osseous or articular abnormality. Electronically Signed   By: Jackey Loge D.O.   On: 10/26/2021 14:43   DG Chest Port 1 View  Result Date: 10/26/2021 CLINICAL DATA:  Provided history: Patient reports right shoulder pain. No known trauma. Possible "blood infection." EXAM:  PORTABLE CHEST 1 VIEW COMPARISON:  Prior chest radiograph 09/17/2020. FINDINGS: Heart size within normal limits. No appreciable airspace consolidation. No evidence of pleural effusion or pneumothorax. No acute bony abnormality identified. IMPRESSION: No evidence of acute cardiopulmonary abnormality. Electronically Signed   By: Jackey Loge D.O.   On: 10/26/2021 14:46   US Abdomen Limited RUQ (LIVER/GB)  Result Date: 10/26/2021 CLINICAL DATA:  Upper abdominal pain EXAM: ULTRASOUND ABDOMEN LIMITED RIGHT UPPER QUADRANT COMPARISON:  CT 01/21/2016 FINDINGS: Gallbladder: 3.7 mm polyp along the anterior gallbladder wall. No follow-up recommended given this appearance. No gallbladder wall thickening. No stone. No Murphy sign. Common bile duct: Diameter: 4 mm common normal. Liver: Slightly increased echogenicity of the liver suggesting mild steatosis. No focal lesion or ductal dilatation. Portal vein is patent on color Doppler imaging with normal direction of blood flow towards the liver. Other: None. IMPRESSION: No evidence of gallstone or cholecystitis. 3.7 mm gallbladder polyp. No follow-up recommended for this low suspicion abnormality. Mild diffuse fatty change of the liver. Electronically Signed   By: Paulina Fusi M.D.   On: 10/26/2021 11:34    Pending Labs Unresulted Labs (From admission, onward)     Start     Ordered   10/27/21 0500  Comprehensive metabolic panel  Tomorrow morning,   STAT        10/26/21 1413   10/27/21 0500  CBC  Tomorrow morning,   STAT        10/26/21 1413   10/26/21 1924  Prepare platelet pheresis  (Adult Blood Administration - Platelets (Pheresed))  Once,   R       Question Answer Comment  Number of Apheresis Units 1 unit   Transfusion Indications PLT Count </=10,000/mm   Date/Time blood product needed For transfusion   If emergent release call blood bank Not emergent release      10/26/21 1924   10/26/21 1414  Urine Culture  Add-on,   AD       Question:  Indication   Answer:  Sepsis   10/26/21 1413   10/26/21 1414  HIV Antibody (routine testing w rflx)  (HIV Antibody (Routine testing w reflex) panel)  Once,   STAT        10/26/21 1413   10/26/21 1414  Hepatitis panel, acute  Once,   STAT  10/26/21 1413   10/26/21 1414  EPSTEIN-BARR VIRUS (EBV) Antibody Profile  Once,   STAT        10/26/21 1413   10/26/21 1414  Technologist smear review  Once,   STAT        10/26/21 1413   10/26/21 1208  Blood culture (routine x 2)  BLOOD CULTURE X 2,   STAT      10/26/21 1208            Vitals/Pain Today's Vitals   10/26/21 1709 10/26/21 1753 10/26/21 1857 10/26/21 1924  BP: (!) 150/97   (!) 141/105  Pulse: 81   98  Resp: 16   19  Temp: 98.6 F (37 C)   99.6 F (37.6 C)  TempSrc: Oral   Oral  SpO2: 96%   96%  Weight:      Height:      PainSc: 10-Worst pain ever 10-Worst pain ever 10-Worst pain ever     Isolation Precautions No active isolations  Medications Medications  escitalopram (LEXAPRO) tablet 10 mg (has no administration in time range)  cyclobenzaprine (FLEXERIL) tablet 5 mg (has no administration in time range)  sodium chloride flush (NS) 0.9 % injection 3 mL (3 mLs Intravenous Given 10/26/21 1421)  sodium chloride flush (NS) 0.9 % injection 3 mL (has no administration in time range)  0.9 %  sodium chloride infusion (has no administration in time range)  acetaminophen (TYLENOL) tablet 650 mg (650 mg Oral Given 10/26/21 1753)  0.9 %  sodium chloride infusion (Manually program via Guardrails IV Fluids) (has no administration in time range)  0.9 %  sodium chloride infusion (0 mLs Intravenous Stopped 10/26/21 1627)  Ampicillin-Sulbactam (UNASYN) 3 g in sodium chloride 0.9 % 100 mL IVPB (0 g Intravenous Stopped 10/26/21 1627)    Mobility walks with person assist Low fall risk   Focused Assessments -    R Recommendations: See Admitting Provider Note  Report given to:   Additional Notes: - Orders to transfuse PLT recently  placed

## 2021-10-26 NOTE — ED Triage Notes (Signed)
Pt to ED via POV with c/o weakness and SHOB for the last week. She is also having a feeling of being constipated but went this am. Her mother told her that she is having signs of appendicitis and to come to the ED to be checked. Pt is having bilat abd pain with nausea. Pt has SEVERAL medical complaints.  ?

## 2021-10-26 NOTE — H&P (Addendum)
?History and Physical  ? ? ?Kathryn Black WEX:[id number] DOB: [birth year] DOA: 10/26/2021 ? ?PCP: Jon Billings, NP  ?Patient coming from: home ? ? ?Chief Complaint: fatigue and lightheadedness ? ?HPI: Kathryn Black is a 46 y.o. female with medical history significant for GAD, allergic rhinitis, who presents with the above. ? ?Fostering some dogs and one of them bit her last week. Not fully vaccinated (received initial vaccines the day prior to the bite). Presented to urgent care, given tetanus ppx and started on augmentin. Bite was on left thigh. Since then that site is bruised but no worsening erythema or swelling or pain in that site. For the past week she complains of lightheadedness and fatigue. Also nausea, no vomiting or diarrhea. A vague sense of discomfort in her lower abdomen. No dysuria. No fevers or chills. Traveled to [facility] a few weeks ago, otherwise no recent travel. 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 No rash. Does have chronic right shoulder pain recently treated with steroid injection and also oral steroids, last steroids taken about a week ago. Has been taking ibuprofen 800 mg three times daily for about a month for this pain. No bleeding.  ? ?ED Course:  ? ?Labs show leukocytosis, thrombocytopenia. Heme/onc consulted. ? ?Review of Systems: As per HPI otherwise 10 point review of systems negative.  ? ? ?Past Medical History:  ?Diagnosis Date  ? Anxiety   ? no meds  ? Asthma   ? childhood-allergy induced  ? Depression   ? no meds  ? Headache(784.0)   ? Hypertension   ? Kidney stone   ? ? ?Past Surgical History:  ?Procedure Laterality Date  ? BREAST BIOPSY  04/01/1989  ? in office procedure under local  ? CYSTOSCOPY WITH URETEROSCOPY  11/23/2011  ? Procedure: CYSTOSCOPY WITH URETEROSCOPY;  Surgeon: Franchot Gallo, MD;  Location: [facility];  Service: Urology;  Laterality: Right;  WITH STONE obtained  ? KIDNEY STONE SURGERY    ? LEEP    ? in office procedure   ? ? ? reports that she has quit smoking. Her smoking use included cigarettes. She smoked an average of .25 packs per day. She has never used smokeless tobacco. She reports current alcohol use. She reports that she does not use drugs. ? ?Allergies  ?Allergen Reactions  ? Sulfa Antibiotics Anaphylaxis  ? Erythromycin Hives  ? Zithromax [Azithromycin]   ? ? ?Family History  ?Problem Relation Age of Onset  ? Depression Sister   ? Hashimoto's thyroiditis Sister   ? ? ?Prior to Admission medications   ?Medication Sig Start Date End Date Taking? Authorizing Provider  ?cyclobenzaprine (FLEXERIL) 5 MG tablet Take 1 tablet (5 mg total) by mouth at bedtime. 08/27/21   Jon Billings, NP  ?escitalopram (LEXAPRO) 10 MG tablet Take 1 tablet (10 mg total) by mouth daily. 08/27/21   Jon Billings, NP  ?losartan (COZAAR) 50 MG tablet Take 1 tablet (50 mg total) by mouth daily. 08/27/21   Jon Billings, NP  ? ? ?Physical Exam: ?Vitals:  ? 10/26/21 0922 10/26/21 1245  ?BP: 126/80 (!) 143/113  ?Pulse: 95 86  ?Resp: 20 18  ?Temp: 98.9 ?F (37.2 ?C)   ?TempSrc: Oral   ?SpO2: 97% 95%  ?Weight: 81.2 kg   ?Height: '5\' 7"'$  (1.702 m)   ? ? ?Constitutional: No acute distress ?Head: Atraumatic ?Eyes: Conjunctiva clear ?ENM: Moist mucous membranes. Normal dentition.  ?Neck: Supple ?Respiratory: Clear to auscultation bilaterally, no wheezing/rales/rhonchi. Normal  respiratory effort. No accessory muscle use. Marland Kitchen ?Cardiovascular: Regular rate and rhythm. No murmurs/rubs/gallops. ?Abdomen: Non-tender, non-distended. No masses. No rebound or guarding. Positive bowel sounds. ?Musculoskeletal: No joint deformity upper and lower extremities. Normal ROM, no contractures. Normal muscle tone.  ?Skin: bruise surrounding few puncture wounds left upper thigh no erythema or induration. Petechia on feet ?Extremities: No peripheral edema. Palpable peripheral pulses. ?Neurologic: Alert, moving all 4 extremities. ?Psychiatric: Normal insight and  judgement. ? ? ?Labs on Admission: I have personally reviewed following labs and imaging studies ? ?CBC: ?Recent Labs  ?Lab 10/26/21 ?0924 10/26/21 ?1007  ?WBC SPECIMEN CLOTTED 28.0*  ?HGB SPECIMEN CLOTTED 12.2  ?HCT SPECIMEN CLOTTED 35.9*  ?MCV SPECIMEN CLOTTED 87.1  ?PLT SPECIMEN CLOTTED 8*  ? ?Basic Metabolic Panel: ?Recent Labs  ?Lab 10/26/21 ?2878  ?NA 138  ?K 3.9  ?CL 105  ?CO2 23  ?GLUCOSE 111*  ?BUN 17  ?CREATININE 0.65  ?CALCIUM 9.3  ? ?GFR: ?Estimated Creatinine Clearance: 98.3 mL/min (by C-G formula based on SCr of 0.65 mg/dL). ?Liver Function Tests: ?Recent Labs  ?Lab 10/26/21 ?1007  ?AST 113*  ?ALT 201*  ?ALKPHOS 233*  ?BILITOT 1.0  ?PROT 6.8  ?ALBUMIN 3.7  ? ?No results for input(s): LIPASE, AMYLASE in the last 168 hours. ?No results for input(s): AMMONIA in the last 168 hours. ?Coagulation Profile: ?No results for input(s): INR, PROTIME in the last 168 hours. ?Cardiac Enzymes: ?No results for input(s): CKTOTAL, CKMB, CKMBINDEX, TROPONINI in the last 168 hours. ?BNP (last 3 results) ?No results for input(s): PROBNP in the last 8760 hours. ?HbA1C: ?No results for input(s): HGBA1C in the last 72 hours. ?CBG: ?No results for input(s): GLUCAP in the last 168 hours. ?Lipid Profile: ?No results for input(s): CHOL, HDL, LDLCALC, TRIG, CHOLHDL, LDLDIRECT in the last 72 hours. ?Thyroid Function Tests: ?No results for input(s): TSH, T4TOTAL, FREET4, T3FREE, THYROIDAB in the last 72 hours. ?Anemia Panel: ?No results for input(s): VITAMINB12, FOLATE, FERRITIN, TIBC, IRON, RETICCTPCT in the last 72 hours. ?Urine analysis: ?   ?Component Value Date/Time  ? COLORURINE YELLOW (A) 10/26/2021 1007  ? APPEARANCEUR HAZY (A) 10/26/2021 1007  ? APPEARANCEUR Clear 08/27/2021 1451  ? LABSPEC 1.023 10/26/2021 1007  ? PHURINE 5.0 10/26/2021 1007  ? GLUCOSEU NEGATIVE 10/26/2021 1007  ? [place] NEGATIVE 10/26/2021 1007  ? [place] NEGATIVE 10/26/2021 1007  ? BILIRUBINUR Negative 08/27/2021 1451  ? [place] NEGATIVE 10/26/2021  1007  ? PROTEINUR 30 (A) 10/26/2021 1007  ? UROBILINOGEN 0.2 11/09/2011 1354  ? NITRITE NEGATIVE 10/26/2021 1007  ? LEUKOCYTESUR NEGATIVE 10/26/2021 1007  ? ? ?Radiological Exams on Admission: ?US Abdomen Limited RUQ (LIVER/GB) ? ?Result Date: 10/26/2021 ?CLINICAL DATA:  Upper abdominal pain EXAM: ULTRASOUND ABDOMEN LIMITED RIGHT UPPER QUADRANT COMPARISON:  CT 01/21/2016 FINDINGS: Gallbladder: 3.7 mm polyp along the anterior gallbladder wall. No follow-up recommended given this appearance. No gallbladder wall thickening. No stone. No Murphy sign. Common bile duct: Diameter: 4 mm common normal. Liver: Slightly increased echogenicity of the liver suggesting mild steatosis. No focal lesion or ductal dilatation. Portal vein is patent on color Doppler imaging with normal direction of blood flow towards the liver. Other: None. IMPRESSION: No evidence of gallstone or cholecystitis. 3.7 mm gallbladder polyp. No follow-up recommended for this low suspicion abnormality. Mild diffuse fatty change of the liver. Electronically Signed   By: Nelson Chimes M.D.   On: 10/26/2021 11:34   ? ? ?Assessment/Plan ?Principal Problem: ?  Thrombocytopenia ([place]) ?Active Problems: ?  Chronic sinusitis ?  Depression ?  Anxiety ?  Hypertension ?  Transaminitis ?  Dog bite ?  ? ?# Thrombocytopenia, severe] ?# Leukocytosis ?# Transaminitis ?Plts of 8. WBC 28. LFTs 113/201. No specific accompanying symptoms but does endorse fatigue and light headedness and diffuse joint pains. Etiology is not clear. This could be infectious given recent dog bite and leukocytosis. Rash is petechial but doesn't have the erythema seen with rmsf and nor does she have fever. Malignancy can also be seen with leukocytosis. No anemia to suggest disorders associated w/ anemia. May be drug-induced (recently treated with augmentin, also taking high dose ibuprofen for one month). Upreg negative. Hematology consulted. No tick bites or rash to suggest rmsf. RUQ u/s w/o acute  findings.  ?- f/u heme recs, will defer decision to transfuse platelets to them ?- ID w/u with blood culture, urine culture, CXR, hepatitis panel, HIV, EBV ?- f/u repeat cbc as is prudent to confirm this truly is thromb

## 2021-10-26 NOTE — Progress Notes (Signed)
Admission profile updated. ?

## 2021-10-26 NOTE — ED Provider Notes (Signed)
? ?[facility] ?Provider Note ? ? ? Event Date/Time  ? First MD Initiated Contact with Patient 10/26/21 1316   ?  (approximate) ? ? ?History  ? ?Dizziness and Weakness ? ? ?HPI ? ?KYERRA VARGO is a 46 y.o. female who presents with complaints of dizziness, fatigue and weakness.  Patient reports she was bitten by a dog 5 days ago, she promptly started Augmentin after cleaning the wound.  Reports wound seems to be healing well, no spreading redness.  No nausea vomiting.  Mild abdominal discomfort intermittently.  Felt dizzy today, no chest pain or shortness of breath or cough.  No dysuria. ?  ? ? ?Physical Exam  ? ?Triage Vital Signs: ?ED Triage Vitals [10/26/21 0922]  ?Enc Vitals Group  ?   BP 126/80  ?   Pulse Rate 95  ?   Resp 20  ?   Temp 98.9 ?F (37.2 ?C)  ?   Temp Source Oral  ?   SpO2 97 %  ?   Weight 81.2 kg (179 lb)  ?   Height 1.702 m ('5\' 7"'$ )  ?   Head Circumference   ?   Peak Flow   ?   Pain Score 10  ?   Pain Loc   ?   Pain Edu?   ?   Excl. in [place]?   ? ? ?Most recent vital signs: ?Vitals:  ? 10/26/21 0922 10/26/21 1245  ?BP: 126/80 (!) 143/113  ?Pulse: 95 86  ?Resp: 20 18  ?Temp: 98.9 ?F (37.2 ?C)   ?SpO2: 97% 95%  ? ? ? ?General: Awake, no distress.  ?CV:  Good peripheral perfusion.  ?Resp:  Normal effort.  ?Abd:  No distention.  ?Other:  Left thigh dog bite, well-appearing, clean dry and intact, no spreading erythema ? ? ?ED Results / Procedures / Treatments  ? ?Labs ?(all labs ordered are listed, but only abnormal results are displayed) ?Labs Reviewed  ?BASIC METABOLIC PANEL - Abnormal; Notable for the following components:  ?    Result Value  ? Glucose, Bld 111 (*)   ? All other components within normal limits  ?CBC - Abnormal; Notable for the following components:  ? WBC 28.0 (*)   ? HCT 35.9 (*)   ? Platelets 8 (*)   ? All other components within normal limits  ?HEPATIC FUNCTION PANEL - Abnormal; Notable for the following components:  ? AST 113 (*)   ? ALT 201 (*)   ? Alkaline  Phosphatase 233 (*)   ? All other components within normal limits  ?URINALYSIS, COMPLETE (UACMP) WITH MICROSCOPIC - Abnormal; Notable for the following components:  ? Color, Urine YELLOW (*)   ? APPearance HAZY (*)   ? Protein, ur 30 (*)   ? Bacteria, UA RARE (*)   ? All other components within normal limits  ?CULTURE, BLOOD (ROUTINE X 2)  ?CULTURE, BLOOD (ROUTINE X 2)  ?RESP PANEL BY RT-PCR (FLU A&B, COVID) [id number]  ?CBC  ?LACTIC ACID, PLASMA  ?LACTIC ACID, PLASMA  ?POC URINE PREG, ED  ?CBG MONITORING, ED  ? ? ? ?EKG ? ?ED ECG REPORT ?I, Lavonia Drafts, the attending physician, personally viewed and interpreted this ECG. ? ?Date: 10/26/2021 ? ?Rhythm: normal sinus rhythm ?QRS Axis: normal ?Intervals: normal ?ST/T Wave abnormalities: normal ?Narrative Interpretation: no evidence of acute ischemia ? ? ? ?RADIOLOGY ?Ultrasound of the right upper quadrant unremarkable ? ? ? ?PROCEDURES: ? ?Critical Care performed: yes ?CRITICAL CARE ?Performed by:  Lavonia Drafts ? ? ?Total critical care time: 30 minutes ? ?Critical care time was exclusive of separately billable procedures and treating other patients. ? ?Critical care was necessary to treat or prevent imminent or life-threatening deterioration. ? ?Critical care was time spent personally by me on the following activities: development of treatment plan with patient and/or surrogate as well as nursing, discussions with consultants, evaluation of patient's response to treatment, examination of patient, obtaining history from patient or surrogate, ordering and performing treatments and interventions, ordering and review of laboratory studies, ordering and review of radiographic studies, pulse oximetry and re-evaluation of patient's condition. ? ? ?Procedures ? ? ?MEDICATIONS ORDERED IN ED: ?Medications  ?0.9 %  sodium chloride infusion (1,000 mLs Intravenous New Bag/Given 10/26/21 1021)  ?Ampicillin-Sulbactam (UNASYN) 3 g in sodium chloride 0.9 % 100 mL IVPB (3 g Intravenous New  Bag/Given 10/26/21 1310)  ? ? ? ?IMPRESSION / MDM / ASSESSMENT AND PLAN / ED COURSE  ?I reviewed the triage vital signs and the nursing notes. ? ?Patient presents with dizziness as detailed above, dog bite wound recently.  Wound overall looks well, she is afebrile, and generally well-appearing. ? ?Differential includes infection, side effect from Augmentin, dehydration, electrolyte abnormalities.  Will check labs, give IV fluids and reevaluate ? ?Patient's white blood count is critically elevated at 28, will send lactic acid. ? ?Notably her platelet count is 9, I did consult Dr. Grayland Ormond of hematology who saw the patient in the emergency department, appreciate his consult. ? ?Question sepsis despite normal lactic acid given elevated white blood cell count, patient reports temperature of nine 9.8, recent dog bite.  Will start Unasyn.  Have admitted to the hospitalist service ? ? ? ? ? ?  ? ? ?FINAL CLINICAL IMPRESSION(S) / ED DIAGNOSES  ? ?Final diagnoses:  ?Severe thrombocytopenia ([place])  ?Wound infection  ? ? ? ?Rx / DC Orders  ? ?ED Discharge Orders   ? ? None  ? ?  ? ? ? ?Note:  This document was prepared using Dragon voice recognition software and may include unintentional dictation errors. ?  ?Lavonia Drafts, MD ?10/26/21 1351 ? ?

## 2021-10-27 ENCOUNTER — Ambulatory Visit
Admit: 2021-10-27 | Discharge: 2021-11-05 | Disposition: A | Discharge status: Home / Self Care | Payer: PRIVATE HEALTH INSURANCE | Source: Other Acute Inpatient Hospital | Admitting: Hematology & Oncology

## 2021-10-27 ENCOUNTER — Encounter
Admit: 2021-10-27 | Discharge: 2021-11-05 | Disposition: A | Discharge status: Home / Self Care | Payer: PRIVATE HEALTH INSURANCE | Source: Other Acute Inpatient Hospital | Attending: Student in an Organized Health Care Education/Training Program | Admitting: Hematology & Oncology

## 2021-10-27 ENCOUNTER — Encounter
Admit: 2021-10-27 | Discharge: 2021-11-05 | Payer: PRIVATE HEALTH INSURANCE | Attending: Student in an Organized Health Care Education/Training Program

## 2021-10-27 ENCOUNTER — Encounter
Admit: 2021-10-27 | Payer: PRIVATE HEALTH INSURANCE | Attending: Student in an Organized Health Care Education/Training Program

## 2021-10-27 ENCOUNTER — Ambulatory Visit: Admit: 2021-10-27 | Payer: PRIVATE HEALTH INSURANCE

## 2021-10-27 ENCOUNTER — Inpatient Hospital Stay: Payer: BC Managed Care – PPO

## 2021-10-27 DIAGNOSIS — C9100 Acute lymphoblastic leukemia not having achieved remission: Secondary | ICD-10-CM

## 2021-10-27 DIAGNOSIS — C9500 Acute leukemia of unspecified cell type not having achieved remission: Secondary | ICD-10-CM

## 2021-10-27 LAB — CBC
HCT: 31.3 % — ABNORMAL LOW (ref 36.0–46.0)
Hemoglobin: 10.5 g/dL — ABNORMAL LOW (ref 12.0–15.0)
MCH: 29.2 pg (ref 26.0–34.0)
MCHC: 33.5 g/dL (ref 30.0–36.0)
MCV: 87.2 fL (ref 80.0–100.0)
Platelets: 14 10*3/uL — CL (ref 150–400)
RBC: 3.59 MIL/uL — ABNORMAL LOW (ref 3.87–5.11)
RDW: 14.1 % (ref 11.5–15.5)
WBC: 54.8 10*3/uL (ref 4.0–10.5)
nRBC: 0.2 % (ref 0.0–0.2)

## 2021-10-27 LAB — COMPREHENSIVE METABOLIC PANEL
ALT: 162 U/L — ABNORMAL HIGH (ref 0–44)
ALT: 165 U/L — ABNORMAL HIGH (ref 0–44)
AST: 81 U/L — ABNORMAL HIGH (ref 15–41)
AST: 83 U/L — ABNORMAL HIGH (ref 15–41)
Albumin: 3.5 g/dL (ref 3.5–5.0)
Albumin: 3.5 g/dL (ref 3.5–5.0)
Alkaline Phosphatase: 222 U/L — ABNORMAL HIGH (ref 38–126)
Alkaline Phosphatase: 241 U/L — ABNORMAL HIGH (ref 38–126)
Anion gap: 8 (ref 5–15)
Anion gap: 8 (ref 5–15)
BUN: 11 mg/dL (ref 6–20)
BUN: 11 mg/dL (ref 6–20)
CO2: 25 mmol/L (ref 22–32)
CO2: 25 mmol/L (ref 22–32)
Calcium: 8.5 mg/dL — ABNORMAL LOW (ref 8.9–10.3)
Calcium: 8.7 mg/dL — ABNORMAL LOW (ref 8.9–10.3)
Chloride: 106 mmol/L (ref 98–111)
Chloride: 105 mmol/L (ref 98–111)
Creatinine, Ser: 0.61 mg/dL (ref 0.44–1.00)
Creatinine, Ser: 0.66 mg/dL (ref 0.44–1.00)
GFR, Estimated: >60 mL/min (ref >60)
GFR, Estimated: >60 mL/min (ref >60)
Glucose, Bld: 89 mg/dL (ref 70–99)
Glucose, Bld: 101 mg/dL — ABNORMAL HIGH (ref 70–99)
Potassium: 4 mmol/L (ref 3.5–5.1)
Potassium: 3.8 mmol/L (ref 3.5–5.1)
Sodium: 139 mmol/L (ref 135–145)
Sodium: 138 mmol/L (ref 135–145)
Total Bilirubin: 0.9 mg/dL (ref 0.3–1.2)
Total Bilirubin: 1 mg/dL (ref 0.3–1.2)
Total Protein: 6.4 g/dL — ABNORMAL LOW (ref 6.5–8.1)
Total Protein: 6.5 g/dL (ref 6.5–8.1)

## 2021-10-27 LAB — URIC ACID
Uric Acid, Serum: 4.1 mg/dL (ref 2.5–7.1)
Uric Acid, Serum: 3.7 mg/dL (ref 2.5–7.1)

## 2021-10-27 LAB — PATHOLOGIST SMEAR REVIEW

## 2021-10-27 LAB — HEPATITIS PANEL, ACUTE
HCV Ab: NONREACTIVE
Hep A IgM: NONREACTIVE
Hep B C IgM: NONREACTIVE
Hepatitis B Surface Ag: NONREACTIVE

## 2021-10-27 LAB — PHOSPHORUS: Phosphorus: 2.4 mg/dL — ABNORMAL LOW (ref 2.5–4.6)

## 2021-10-27 MED ORDER — SODIUM CHLORIDE 0.9 % IV SOLN: INTRAVENOUS | Status: DC

## 2021-10-27 MED ORDER — OXYCODONE HCL 5 MG PO TABS
5.0000 mg | ORAL_TABLET | Freq: Four times a day (QID) | ORAL | Status: DC | PRN
  Administered 2021-10-27: 5 mg via ORAL
  Filled 2021-10-27: qty 1

## 2021-10-27 MED ORDER — HYDROXYUREA 500 MG PO CAPS
2000.0000 mg | ORAL_CAPSULE | Freq: Every day | ORAL | Status: DC
  Administered 2021-10-27: 2000 mg via ORAL
  Filled 2021-10-27: qty 4

## 2021-10-27 MED ORDER — ALLOPURINOL 100 MG PO TABS
200.0000 mg | ORAL_TABLET | Freq: Three times a day (TID) | ORAL | Status: DC
  Administered 2021-10-27: 200 mg via ORAL
  Filled 2021-10-27: qty 2

## 2021-10-27 MED ORDER — SODIUM CHLORIDE 0.9 % IV SOLN: 200.0000 mg | Freq: Every day | INTRAVENOUS | Status: DC

## 2021-10-27 MED ORDER — ONDANSETRON HCL 4 MG/2ML IJ SOLN: 4.0000 mg | Freq: Once | INTRAMUSCULAR | Status: DC

## 2021-10-27 NOTE — Progress Notes (Addendum)
Report given to [facility], RN at [facility]. Pt will be going to [facility] room # [phone].  ? ?[phone] [facility] transportation services here to transport pt to [place]. Report given to [place].  ?

## 2021-10-27 NOTE — Discharge Summary (Signed)
Kathryn Black WEX:[id number] DOB: 05-08-1976 DOA: 10/26/2021 ? ?PCP: Jon Billings, NP ? ?Admit date: 10/26/2021 ?Discharge date: 10/27/2021 ? ?Time spent: 45 minutes ? ? ? ?Discharge Diagnoses:  ?Principal Problem: ?  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  Chronic sinusitis ?  Depression ?  Anxiety ?  Hypertension ?  Thrombocytopenia ([place]) ?  Transaminitis ?  Dog bite ? ? ?Discharge Condition: stable ? ?Diet recommendation: regular ? ?Filed Weights  ? 10/26/21 9381  ?Weight: 81.2 kg  ? ? ?History of present illness:  ?Fostering some dogs and one of them bit her last week. Not fully vaccinated (received initial vaccines the day prior to the bite). Presented to urgent care, given tetanus ppx and started on augmentin. Bite was on left thigh. Since then that site is bruised but no worsening erythema or swelling or pain in that site. For the past week she complains of lightheadedness and fatigue. Also nausea, no vomiting or diarrhea. A vague sense of discomfort in her lower abdomen. No dysuria. No fevers or chills. Traveled to [facility] a few weeks ago, otherwise no recent travel. 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 No rash. Does have chronic right shoulder pain recently treated with steroid injection and also oral steroids, last steroids taken about a week ago. Has been taking ibuprofen 800 mg three times daily for about a month for this pain. No bleeding.  ? ?Hospital Course:  ?Patient presented with one month fatigue and lightheadedness, also right shoulder pain and generalized body aches and arthralgias. Also dog bite one week ago on left thigh. No signs infection at site of that dog bite but labs show severe thrombocytopenia, severe leukocytosis, mild transaminitis. RUQ u/s negative save for mild steatosis. MRI of right shoulder negative. Peripheral smear shows likely blasts. Oncology consulted, advises [facility] transfer for acute leukemia. After consultation with the [facility] oncology team,  allopurinol started (200 mg oral, we do not have IV allopurinol) as well as hydroxyurea. Fluids also started prior to allopurinol. Patient was transfused one unite of platelets on 3/28 and platelets improved to 14 on 3/29. Flow cytometry obtained and is in process at [facility].  ? ?Procedures: ?none  ? ?Consultations: ?ID, heme/onc ? ?Discharge Exam: ?Vitals:  ? 10/27/21 0135 10/27/21 0738  ?BP: 137/88 123/88  ?Pulse: 89 80  ?Resp: 20 15  ?Temp: 99 ?F (37.2 ?C) 98.3 ?F (36.8 ?C)  ?SpO2: 100% 97%  ? ? ?Constitutional: No acute distress ?Head: Atraumatic ?Eyes: Conjunctiva clear ?ENM: Moist mucous membranes. Normal dentition.  ?Neck: Supple ?Respiratory: Clear to auscultation bilaterally, no wheezing/rales/rhonchi. Normal respiratory effort. No accessory muscle use. Marland Kitchen ?Cardiovascular: Regular rate and rhythm. No murmurs/rubs/gallops. ?Abdomen: Non-tender, non-distended. No masses. No rebound or guarding. Positive bowel sounds. ?Musculoskeletal: No joint deformity upper and lower extremities. Normal ROM, no contractures. Normal muscle tone.  ?Skin: bruise surrounding few puncture wounds left upper thigh no erythema or induration. Petechia on feet ?Extremities: No peripheral edema. Palpable peripheral pulses. ?Neurologic: Alert, moving all 4 extremities. ?Psychiatric: Normal insight and judgement. ? ?Discharge Instructions ? ? ?Discharge Instructions   ? ? Diet - low sodium heart healthy   Complete by: As directed ?  ? Increase activity slowly   Complete by: As directed ?  ? ?  ? ?Allergies as of 10/27/2021   ? ?   Reactions  ? Sulfa Antibiotics Anaphylaxis  ? Erythromycin Hives  ? Zithromax [azithromycin]   ? ?  ? ?  ?Medication List  ?  ? ?  STOP taking these medications   ? ?amoxicillin-clavulanate 875-125 MG tablet ?Commonly known as: AUGMENTIN ?  ?cyclobenzaprine 5 MG tablet ?Commonly known as: FLEXERIL ?  ?etodolac 500 MG tablet ?Commonly known as: LODINE ?  ?losartan 50 MG tablet ?Commonly known as: COZAAR ?   ?methocarbamol 500 MG tablet ?Commonly known as: ROBAXIN ?  ?methylPREDNISolone 4 MG Tbpk tablet ?Commonly known as: MEDROL DOSEPAK ?  ?tiZANidine 4 MG tablet ?Commonly known as: ZANAFLEX ?  ?traMADol 50 MG tablet ?Commonly known as: ULTRAM ?  ? ?  ? ?TAKE these medications   ? ?escitalopram 10 MG tablet ?Commonly known as: LEXAPRO ?Take 1 tablet (10 mg total) by mouth daily. ?  ?mupirocin ointment 2 % ?Commonly known as: BACTROBAN ?Apply 1 application. topically daily. ?  ? ?  ? ?Allergies  ?Allergen Reactions  ? Sulfa Antibiotics Anaphylaxis  ? Erythromycin Hives  ? Zithromax [Azithromycin]   ? ? ? ? ?The results of significant diagnostics from this hospitalization (including imaging, microbiology, ancillary and laboratory) are listed below for reference.   ? ?Significant Diagnostic Studies: ?DG Shoulder Right ? ?Result Date: 10/26/2021 ?CLINICAL DATA:  Provided history: Right shoulder pain. No history of trauma. Shortness of breath. Weakness. EXAM: RIGHT SHOULDER - 2+ VIEW COMPARISON:  None FINDINGS: There is normal bony alignment. No evidence of acute osseous or articular abnormality. The joint spaces are maintained. IMPRESSION: No evidence of acute osseous or articular abnormality. Electronically Signed   By: Kellie Simmering D.O.   On: 10/26/2021 14:43  ? ?MR SHOULDER RIGHT WO CONTRAST ? ?Result Date: 10/27/2021 ?CLINICAL DATA:  Chronic right shoulder pain. Dog bite to thigh last week. Question septic joint. EXAM: MRI OF THE RIGHT SHOULDER WITHOUT CONTRAST TECHNIQUE: Multiplanar, multisequence MR imaging of the shoulder was performed. No intravenous contrast was administered. COMPARISON:  Radiographs 10/26/2021. FINDINGS: Rotator cuff:  Intact without significant tendinosis. Muscles:  No focal muscular atrophy or edema. Biceps long head:  Intact and normally positioned. Acromioclavicular Joint: The acromion is type 1. There are minimal acromioclavicular degenerative changes. No significant fluid in the  subacromial-subdeltoid bursa. Glenohumeral Joint: No significant shoulder joint effusion or glenohumeral arthropathy. Labrum: Labral assessment limited by the lack of joint fluid. There is a small cyst along the posterosuperior labrum, measuring 6 mm on image 11/5. Based on location, this could reflect a small paralabral cyst. No displaced labral tear identified. Bones: There is diffusely decreased T1 and mildly increased T2 signal throughout the visualized marrow of the proximal right humerus, right scapula and clavicle attributed to the patient's anemia. No erosive changes or cortical destruction identified. Other: No periarticular inflammatory changes or fluid collections. Small axillary lymph nodes are not pathologically enlarged. IMPRESSION: 1. No evidence of septic arthritis or osteomyelitis. 2. Diffuse marrow changes about the shoulder consistent with marrow reconversion, likely from known anemia. 3. Possible small superior paralabral cysts without displaced labral tear. The biceps tendon and rotator cuff appear normal. Electronically Signed   By: Richardean Sale M.D.   On: 10/27/2021 12:05  ? ?DG Chest Port 1 View ? ?Result Date: 10/26/2021 ?CLINICAL DATA:  Provided history: Patient reports right shoulder pain. No known trauma. Possible "blood infection." EXAM: PORTABLE CHEST 1 VIEW COMPARISON:  Prior chest radiograph 09/17/2020. FINDINGS: Heart size within normal limits. No appreciable airspace consolidation. No evidence of pleural effusion or pneumothorax. No acute bony abnormality identified. IMPRESSION: No evidence of acute cardiopulmonary abnormality. Electronically Signed   By: Kellie Simmering D.O.   On: 10/26/2021 14:46  ? ?  US Abdomen Limited RUQ (LIVER/GB) ? ?Result Date: 10/26/2021 ?CLINICAL DATA:  Upper abdominal pain EXAM: ULTRASOUND ABDOMEN LIMITED RIGHT UPPER QUADRANT COMPARISON:  CT 01/21/2016 FINDINGS: Gallbladder: 3.7 mm polyp along the anterior gallbladder wall. No follow-up recommended given  this appearance. No gallbladder wall thickening. No stone. No Murphy sign. Common bile duct: Diameter: 4 mm common normal. Liver: Slightly increased echogenicity of the liver suggesting mild steatosis. No focal lesion or ductal dilat

## 2021-10-27 NOTE — Progress Notes (Signed)
?[place]  ?[phone] [phone] ? ?ID: Katina Dung OB: Jun 18, 1976  MR#: [id number]  CSN#:[id number] ? ?Patient Care Team: ?Jon Billings, NP as PCP - General (Nurse Practitioner) ? 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 ?INTERVAL HISTORY: Patient continues to have significant right shoulder pain.  She is anxious about her transfer to [facility] and her diagnosis, but otherwise feels well. ? ?REVIEW OF SYSTEMS:   ?Review of Systems  ?Constitutional:  Positive for malaise/fatigue. Negative for fever and weight loss.  ?Respiratory: Negative.  Negative for cough, hemoptysis and shortness of breath.   ?Cardiovascular: Negative.  Negative for chest pain and leg swelling.  ?Gastrointestinal: Negative.  Negative for abdominal pain.  ?Genitourinary: Negative.  Negative for dysuria.  ?Musculoskeletal:  Positive for joint pain.  ?Skin: Negative.  Negative for rash.  ?Neurological:  Positive for weakness. Negative for dizziness, focal weakness and headaches.  ?Psychiatric/Behavioral:  The patient is nervous/anxious.   ? ?As per HPI. Otherwise, a complete review of systems is negative. ? ?PAST MEDICAL HISTORY: ?Past Medical History:  ?Diagnosis Date  ? Anxiety   ? no meds  ? Asthma   ? childhood-allergy induced  ? Depression   ? no meds  ? Headache(784.0)   ? Hypertension   ? Kidney stone   ? ? ?PAST SURGICAL HISTORY: ?Past Surgical History:  ?Procedure Laterality Date  ? BREAST BIOPSY  04/01/1989  ? in office procedure under local  ? CYSTOSCOPY WITH URETEROSCOPY  11/23/2011  ? Procedure: CYSTOSCOPY WITH URETEROSCOPY;  Surgeon: Franchot Gallo, MD;  Location: [facility];  Service: Urology;  Laterality: Right;  WITH STONE obtained  ? KIDNEY STONE SURGERY    ? LEEP    ? in office procedure  ? ? ?FAMILY HISTORY: ?Family History  ?Problem Relation Age of Onset  ? Depression Sister   ? Hashimoto's thyroiditis Sister   ? ? ?ADVANCED DIRECTIVES (Y/N):  '@ADVDIR'$ @ ? ?HEALTH  MAINTENANCE: ?Social History  ? ?Tobacco Use  ? Smoking status: Former  ?  Packs/day: 0.25  ?  Types: Cigarettes  ? Smokeless tobacco: Never  ?Vaping Use  ? Vaping Use: Former  ?Substance Use Topics  ? Alcohol use: Yes  ?  Comment: occ  ? Drug use: No  ? ? ? Colonoscopy: ? PAP: ? Bone density: ? Lipid panel: ? ?Allergies  ?Allergen Reactions  ? Sulfa Antibiotics Anaphylaxis  ? Erythromycin Hives  ? Zithromax [Azithromycin]   ? ? ?Current Facility-Administered Medications  ?Medication Dose Route Frequency Provider Last Rate Last Admin  ? 0.9 %  sodium chloride infusion (Manually program via Guardrails IV Fluids)   Intravenous Once Gwynne Edinger, MD   Held at 10/26/21 2100  ? 0.9 %  sodium chloride infusion  250 mL Intravenous PRN Wouk, Ailene Rud, MD      ? 0.9 %  sodium chloride infusion   Intravenous Continuous Gwynne Edinger, MD 100 mL/hr at 10/27/21 1058 New Bag at 10/27/21 1058  ? acetaminophen (TYLENOL) tablet 650 mg  650 mg Oral Q6H PRN Gwynne Edinger, MD   650 mg at 10/27/21 0610  ? allopurinol (ZYLOPRIM) tablet 200 mg  200 mg Oral Q8H Wouk, Ailene Rud, MD   200 mg at 10/27/21 1216  ? cyclobenzaprine (FLEXERIL) tablet 5 mg  5 mg Oral QHS Wouk, Ailene Rud, MD   5 mg at 10/27/21 0031  ? escitalopram (LEXAPRO) tablet 10 mg  10 mg Oral Daily Wouk, Ailene Rud, MD   10 mg  at 10/27/21 0932  ? hydroxyurea (HYDREA) capsule 2,000 mg  2,000 mg Oral Daily Gwynne Edinger, MD   2,000 mg at 10/27/21 1216  ? ondansetron (ZOFRAN) injection 4 mg  4 mg Intravenous Once Foust, Katy L, NP      ? oxyCODONE (Oxy IR/ROXICODONE) immediate release tablet 5 mg  5 mg Oral Q6H PRN Gwynne Edinger, MD   5 mg at 10/27/21 1113  ? sodium chloride flush (NS) 0.9 % injection 3 mL  3 mL Intravenous Q12H Gwynne Edinger, MD   3 mL at 10/27/21 6712  ? sodium chloride flush (NS) 0.9 % injection 3 mL  3 mL Intravenous PRN Wouk, Ailene Rud, MD      ? ?Current Outpatient Medications  ?Medication Sig Dispense Refill  ?  escitalopram (LEXAPRO) 10 MG tablet Take 1 tablet (10 mg total) by mouth daily. 90 tablet 1  ? mupirocin ointment (BACTROBAN) 2 % Apply 1 application. topically daily.    ? ? ?OBJECTIVE: ?Vitals:  ? 10/27/21 0738 10/27/21 1525  ?BP: 123/88 (!) 147/99  ?Pulse: 80 98  ?Resp: 15 16  ?Temp: 98.3 ?F (36.8 ?C) 99.7 ?F (37.6 ?C)  ?SpO2: 97% 98%  ?   Body mass index is 28.04 kg/m?Marland Kitchen    ECOG FS:1 - Symptomatic but completely ambulatory ? ?General: Well-developed, well-nourished, no acute distress. ?Eyes: Pink conjunctiva, anicteric sclera. ?HEENT: Normocephalic, moist mucous membranes. ?Lungs: No audible wheezing or coughing. ?Heart: Regular rate and rhythm. ?Abdomen: Soft, nontender, no obvious distention. ?Musculoskeletal: No edema, cyanosis, or clubbing. ?Neuro: Alert, answering all questions appropriately. Cranial nerves grossly intact. ?Skin: No rashes or petechiae noted. ?Psych: Normal affect. ?Lymphatics: No cervical, calvicular, axillary or inguinal LAD. ? ? ?LAB RESULTS: ? ?Lab Results  ?Component Value Date  ? NA 138 10/27/2021  ? K 3.8 10/27/2021  ? CL 105 10/27/2021  ? CO2 25 10/27/2021  ? GLUCOSE 101 (H) 10/27/2021  ? BUN 11 10/27/2021  ? CREATININE 0.66 10/27/2021  ? CALCIUM 8.7 (L) 10/27/2021  ? PROT 6.5 10/27/2021  ? ALBUMIN 3.5 10/27/2021  ? AST 83 (H) 10/27/2021  ? ALT 165 (H) 10/27/2021  ? ALKPHOS 241 (H) 10/27/2021  ? BILITOT 1.0 10/27/2021  ? GFRNONAA >60 10/27/2021  ? GFRAA >60 11/04/2019  ? ? ?Lab Results  ?Component Value Date  ? WBC 54.8 (HH) 10/27/2021  ? NEUTROABS 1.9 10/26/2021  ? HGB 10.5 (L) 10/27/2021  ? HCT 31.3 (L) 10/27/2021  ? MCV 87.2 10/27/2021  ? PLT 14 (LL) 10/27/2021  ? ? ? ?STUDIES: ?DG Shoulder Right ? ?Result Date: 10/26/2021 ?CLINICAL DATA:  Provided history: Right shoulder pain. No history of trauma. Shortness of breath. Weakness. EXAM: RIGHT SHOULDER - 2+ VIEW COMPARISON:  None FINDINGS: There is normal bony alignment. No evidence of acute osseous or articular abnormality. The  joint spaces are maintained. IMPRESSION: No evidence of acute osseous or articular abnormality. Electronically Signed   By: Kellie Simmering D.O.   On: 10/26/2021 14:43  ? ?MR SHOULDER RIGHT WO CONTRAST ? ?Result Date: 10/27/2021 ?CLINICAL DATA:  Chronic right shoulder pain. Dog bite to thigh last week. Question septic joint. EXAM: MRI OF THE RIGHT SHOULDER WITHOUT CONTRAST TECHNIQUE: Multiplanar, multisequence MR imaging of the shoulder was performed. No intravenous contrast was administered. COMPARISON:  Radiographs 10/26/2021. FINDINGS: Rotator cuff:  Intact without significant tendinosis. Muscles:  No focal muscular atrophy or edema. Biceps long head:  Intact and normally positioned. Acromioclavicular Joint: The acromion is type 1. There are minimal  acromioclavicular degenerative changes. No significant fluid in the subacromial-subdeltoid bursa. Glenohumeral Joint: No significant shoulder joint effusion or glenohumeral arthropathy. Labrum: Labral assessment limited by the lack of joint fluid. There is a small cyst along the posterosuperior labrum, measuring 6 mm on image 11/5. Based on location, this could reflect a small paralabral cyst. No displaced labral tear identified. Bones: There is diffusely decreased T1 and mildly increased T2 signal throughout the visualized marrow of the proximal right humerus, right scapula and clavicle attributed to the patient's anemia. No erosive changes or cortical destruction identified. Other: No periarticular inflammatory changes or fluid collections. Small axillary lymph nodes are not pathologically enlarged. IMPRESSION: 1. No evidence of septic arthritis or osteomyelitis. 2. Diffuse marrow changes about the shoulder consistent with marrow reconversion, likely from known anemia. 3. Possible small superior paralabral cysts without displaced labral tear. The biceps tendon and rotator cuff appear normal. Electronically Signed   By: Richardean Sale M.D.   On: 10/27/2021 12:05  ? ?DG  Chest Port 1 View ? ?Result Date: 10/26/2021 ?CLINICAL DATA:  Provided history: Patient reports right shoulder pain. No known trauma. Possible "blood infection." EXAM: PORTABLE CHEST 1 VIEW COMPARISON:  Prior chest ra

## 2021-10-27 NOTE — Consult Note (Signed)
NAME: Kathryn Black  ?DOB: Sep 07, 1975  ?MRN: [id number]  ?Date/Time: 10/27/2021 11:35 AM ? ?REQUESTING PROVIDER: Dr.Wouk ?Subjective:  ?REASON FOR CONSULT: Dog bite, leucocytosis ?? ?Kathryn Black is a 46 y.o. female with a history hypertension ?Presents with weakness and fatigue.of few days duration- also feeling dizzy ?Patient recently sustained a dog bite injury on 10/20/2021.  She was fostering.  As per the patient the dogs had rabies vaccine the day before the bite.  She came to the urgent care and was given a tetanus shot and put on Augmentin ?Patient has been having right-sided neck and  shoulder pain radiating to the right arm for4 weeks now.  This started slowly in March 2023 while she was in [place].  She went to an orthopedic facility in [place] and x-rays were negative.  She was given a steroid injection in the right shoulder.  It improved only to recur and she was seen at [facility] on 10/15/21 and was given medrol pak for cervical radiculopthy.  She has been taking ibuprofen 80 mg 3 times a day for the past month. ? ?In the ED BP 142/101, temperature 97.6, pulse 97, sats 98%. ?Labs revealed a WBC of 35.1, Hb 10.7, platelet 8.  Her CBC was normal on 08/27/2021 with hemoglobin of 13.9, platelet 316 and WBC of 6.9. ?AST was 113 ALT 201 and alkaline phosphatase 233.  Total bilirubin 1.0.  Creatinine 0.65 and glucose 111.  LDH was 1646.  Ferritin 847.  Lactic acid 1. ?I am asked to see the patient to r/o infection ?Past Medical History:  ?Diagnosis Date  ? Anxiety   ? no meds  ? Asthma   ? childhood-allergy induced  ? Depression   ? no meds  ? Headache(784.0)   ? Hypertension   ? Kidney stone   ?  ?Past Surgical History:  ?Procedure Laterality Date  ? BREAST BIOPSY  04/01/1989  ? in office procedure under local  ? CYSTOSCOPY WITH URETEROSCOPY  11/23/2011  ? Procedure: CYSTOSCOPY WITH URETEROSCOPY;  Surgeon: Franchot Gallo, MD;  Location: [facility];  Service: Urology;  Laterality: Right;  WITH  STONE obtained  ? KIDNEY STONE SURGERY    ? LEEP    ? in office procedure  ?  ?Social History  ? ?Socioeconomic History  ? Marital status: Single  ?  Spouse name: Not on file  ? Number of children: Not on file  ? Years of education: Not on file  ? Highest education level: Not on file  ?Occupational History  ? Not on file  ?Tobacco Use  ? Smoking status: Former  ?  Packs/day: 0.25  ?  Types: Cigarettes  ? Smokeless tobacco: Never  ?Vaping Use  ? Vaping Use: Former  ?Substance and Sexual Activity  ? Alcohol use: Yes  ?  Comment: occ  ? Drug use: No  ? Sexual activity: Yes  ?Other Topics Concern  ? Not on file  ?Social History Narrative  ? Not on file  ? ?Social Determinants of Health  ? ?Financial Resource Strain: Not on file  ?Food Insecurity: Not on file  ?Transportation Needs: Not on file  ?Physical Activity: Not on file  ?Stress: Not on file  ?Social Connections: Not on file  ?Intimate Partner Violence: Not on file  ?  ?Family History  ?Problem Relation Age of Onset  ? Depression Sister   ? Hashimoto's thyroiditis Sister   ? ?Allergies  ?Allergen Reactions  ? Sulfa Antibiotics Anaphylaxis  ?  Erythromycin Hives  ? Zithromax [Azithromycin]   ? ?I? ?Current Facility-Administered Medications  ?Medication Dose Route Frequency Provider Last Rate Last Admin  ? 0.9 %  sodium chloride infusion (Manually program via Guardrails IV Fluids)   Intravenous Once Gwynne Edinger, MD   Held at 10/26/21 2100  ? 0.9 %  sodium chloride infusion  250 mL Intravenous PRN Wouk, Ailene Rud, MD      ? 0.9 %  sodium chloride infusion   Intravenous Continuous Gwynne Edinger, MD 100 mL/hr at 10/27/21 1058 New Bag at 10/27/21 1058  ? acetaminophen (TYLENOL) tablet 650 mg  650 mg Oral Q6H PRN Gwynne Edinger, MD   650 mg at 10/27/21 0610  ? allopurinol (ZYLOPRIM) tablet 200 mg  200 mg Oral Q8H Wouk, Ailene Rud, MD      ? cyclobenzaprine (FLEXERIL) tablet 5 mg  5 mg Oral QHS Wouk, Ailene Rud, MD   5 mg at 10/27/21 0031  ?  escitalopram (LEXAPRO) tablet 10 mg  10 mg Oral Daily Gwynne Edinger, MD   10 mg at 10/27/21 7824  ? hydroxyurea (HYDREA) capsule 2,000 mg  2,000 mg Oral Daily Wouk, Ailene Rud, MD      ? ondansetron [facility]) injection 4 mg  4 mg Intravenous Once Foust, Katy L, NP      ? oxyCODONE (Oxy IR/ROXICODONE) immediate release tablet 5 mg  5 mg Oral Q6H PRN Gwynne Edinger, MD   5 mg at 10/27/21 1113  ? sodium chloride flush (NS) 0.9 % injection 3 mL  3 mL Intravenous Q12H Gwynne Edinger, MD   3 mL at 10/27/21 2353  ? sodium chloride flush (NS) 0.9 % injection 3 mL  3 mL Intravenous PRN Wouk, Ailene Rud, MD      ?  ? ?Abtx:  ?Anti-infectives (From admission, onward)  ? ? Start     Dose/Rate Route Frequency Ordered Stop  ? 10/26/21 1315  Ampicillin-Sulbactam (UNASYN) 3 g in sodium chloride 0.9 % 100 mL IVPB       ? 3 g ?200 mL/hr over 30 Minutes Intravenous  Once 10/26/21 1302 10/26/21 1627  ? ?  ? ? ?REVIEW OF SYSTEMS:  ?Const: negative fever, negative chills, negative weight loss ?Eyes: negative diplopia or visual changes, negative eye pain ?ENT: negative coryza, negative sore throat ?Resp: negative cough, hemoptysis, dyspnea ?Cards: negative for chest pain, palpitations, lower extremity edema ?GU: negative for frequency, dysuria and hematuria ?GI: Negative for abdominal pain, diarrhea, bleeding, constipation ?Skin: negative for rash and pruritus ?Heme: negative for easy bruising and gum/nose bleeding ?MS: generalized weakness ?Pain rt shoulder ?Neurolo+, dizziness, no vertigo, memory problems  ?Psych: negative for feelings of anxiety, depression  ?Endocrine: negative for thyroid, diabetes ?Allergy/Immunology- as above ?Objective:  ?VITALS:  ?BP 123/88 (BP Location: Right Arm)   Pulse 80   Temp 98.3 ?F (36.8 ?C) (Oral)   Resp 15   Ht '5\' 7"'$  (1.702 m)   Wt 81.2 kg   SpO2 97%   BMI 28.04 kg/m?  ?PHYSICAL EXAM:  ?General: Alert, cooperative, no distress, pale  ?Head: Normocephalic, without obvious  abnormality, atraumatic. ?Eyes: Conjunctivae clear, anicteric sclerae. Pupils are equal ?ENT Nares normal. No drainage or sinus tenderness. ?Lips, mucosa, and tongue normal. No Thrush ?Neck: Supple, symmetrical, no adenopathy, thyroid: non tender ?no carotid bruit and no JVD. ?Back: No CVA tenderness. ?Lungs: Clear to auscultation bilaterally. No Wheezing or Rhonchi. No rales. ?Heart: Regular rate and rhythm, no murmur, rub or gallop. ?  Abdomen: Soft, non-tender,not distended. Bowel sounds normal. No masses ?Extremities: left thigh- dog bit site has scab and surrounding ecchymosis ? ?No erythema ?Skin: as above ?Lymph: Cervical, supraclavicular normal. ?Neurologic: Grossly non-focal ?Pertinent Labs ?Lab Results ?CBC ?   ?Component Value Date/Time  ? WBC 54.8 ([place]) 10/27/2021 0604  ? RBC 3.59 (L) 10/27/2021 0604  ? HGB 10.5 (L) 10/27/2021 0604  ? HGB 13.9 08/27/2021 1455  ? HCT 31.3 (L) 10/27/2021 0604  ? HCT 40.8 08/27/2021 1455  ? PLT 14 (LL) 10/27/2021 0604  ? PLT 316 08/27/2021 1455  ? MCV 87.2 10/27/2021 0604  ? MCV 91 08/27/2021 1455  ? MCH 29.2 10/27/2021 0604  ? MCHC 33.5 10/27/2021 0604  ? RDW 14.1 10/27/2021 0604  ? RDW 12.6 08/27/2021 1455  ? LYMPHSABS 23.9 (H) 10/26/2021 1709  ? LYMPHSABS 1.8 08/27/2021 1455  ? MONOABS 8.5 (H) 10/26/2021 1709  ? EOSABS 0.1 10/26/2021 1709  ? EOSABS 0.2 08/27/2021 1455  ? BASOSABS 0.2 (H) 10/26/2021 1709  ? BASOSABS 0.0 08/27/2021 1455  ? ? ? ?  Latest Ref Rng & Units 10/27/2021  ?  6:04 AM 10/26/2021  ? 10:07 AM 10/26/2021  ?  9:24 AM  ?CMP  ?Glucose 70 - 99 mg/dL 89    111    ?BUN 6 - 20 mg/dL 11    17    ?Creatinine 0.44 - 1.00 mg/dL 0.61    0.65    ?Sodium 135 - 145 mmol/L 139    138    ?Potassium 3.5 - 5.1 mmol/L 4.0    3.9    ?Chloride 98 - 111 mmol/L 106    105    ?CO2 22 - 32 mmol/L 25    23    ?Calcium 8.9 - 10.3 mg/dL 8.5    9.3    ?Total Protein 6.5 - 8.1 g/dL 6.4   6.8     ?Total Bilirubin 0.3 - 1.2 mg/dL 0.9   1.0     ?Alkaline Phos 38 - 126 U/L 222   233     ?AST  15 - 41 U/L 81   113     ?ALT 0 - 44 U/L 162   201     ? ? ? ? ?Microbiology: ?Recent Results (from the past 240 hour(s))  ?Blood culture (routine x 2)     Status: None (Preliminary result)  ? Collection Time: 10/26/21 12:2

## 2021-10-27 NOTE — Care Management (Signed)
Anticipated transfer to [facility] today ?

## 2021-10-28 LAB — URINE CULTURE: Culture: NO GROWTH

## 2021-10-28 LAB — COMP PANEL: LEUKEMIA/LYMPHOMA: Immunophenotypic Profile: 82

## 2021-10-28 LAB — PREPARE PLATELET PHERESIS: Unit division: 0

## 2021-10-28 LAB — BPAM PLATELET PHERESIS
Blood Product Expiration Date: 202303292359
ISSUE DATE / TIME: 202303290054
Unit Type and Rh: 6200

## 2021-10-28 LAB — EPSTEIN-BARR VIRUS (EBV) ANTIBODY PROFILE
EBV NA IgG: 93.8 U/mL — ABNORMAL HIGH (ref 0.0–17.9)
EBV VCA IgG: >600 U/mL — ABNORMAL HIGH (ref 0.0–17.9)
EBV VCA IgM: <36 U/mL (ref 0.0–35.9)

## 2021-10-28 LAB — HAPTOGLOBIN: Haptoglobin: 61 mg/dL (ref 42–296)

## 2021-10-29 DIAGNOSIS — C9100 Acute lymphoblastic leukemia not having achieved remission: Secondary | ICD-10-CM | POA: Diagnosis present

## 2021-10-31 LAB — CULTURE, BLOOD (ROUTINE X 2)
Culture: NO GROWTH
Culture: NO GROWTH

## 2021-11-03 DIAGNOSIS — C9100 Acute lymphoblastic leukemia not having achieved remission: Principal | ICD-10-CM

## 2021-11-03 MED ORDER — DASATINIB 70 MG TABLET
ORAL_TABLET | Freq: Every day | ORAL | 0 refills | 30 days | Status: CP
Start: 2021-11-03 — End: ?

## 2021-11-03 NOTE — Unmapped (Signed)
Hematology/Oncology APP Daily Progress Note      Active Problems:    Leukocytosis    B-cell acute lymphoblastic leukemia (ALL) (CMS-HCC)       LOS: 7 days     Assessment/Plan:  Patricia [name] is an 46 y.o. female with no significant PMHx who was transferred from OSH due to Leukocytosis and for Acute Leukemia.    Plan Summary: D5=4/5 of GRAAPH-2005 + Dasatinib. On ppx valtrex. IT chemo today. Palliative care following. On Lidocaine patch and oxycodone PRN for shoulder and back pain, well-controlled.     Ph (+) B-Cell ALL: Patient with 1 week history of night sweats, intermittent low grade fevers, easy bruising, severe weakness and lethargy, and HA, and visual floaters who presented to OSH. Patient found to have WBC 54.8 with concern for peripheral blasts. Hgb 10.5, Plts 14. LDH 1646. Started on Hydrea and transitioned to prednisone pre phase once ALL was confirmed. Awaiting BCR/ABL status. Hepatitis panel at OSH negative. Patient declined fertility preservation. Echo complete (EF 60-65%), PICC in place. BCR/ABL +. BMBx completed 3/31. HepB SAb +, C Ab - (immune d/t vaccination). Prednisone 100mg  prephase (3/30-4/1).  - D1 [id number] = 4/1  - IT chemo Wednesday, 4/5  - CBC daily     Right Shoulder/Back Pain, Chronic with Acute Exacerbation: Endorses chronic right shoulder pain since 2020. Endorses having been on multiple medication/pain regimens with little benefit. Patient states her shoulder became acutely painful in early March which is different in character from baseline discomfort. States she had a steroid injection in early 09/2021 with some benefit but has not lasted. States pain is only relieved by steroids and ice packs. Pain refractory of Tramadol, Tizanidine, Methocarbamol, and Ibuprofen. Obtained an Xray and MRI at OSH without acute pathology.   - Oxycodone 5-10 PO PRN panel   - Lidocaine patch lumbar/cervial spine   - Flexeril once   - Palliative care consulted      Elevated Aminotransferases: Likely secondary to underlying possible leukemic process vs medication induced. Patient states she has been on a large medication regimen for treating her shoulder pain since early March 2023. RUQ [place] at OSH with slightly increased echogenicity suggesting mild steatosis. No focal lesions.  - Daily LFTs     Dog Bite,resolved: Patient with recent dog bite and started on Augmentin 875-125mg  (3/23- ) with plan for 10 day course. Patient did receive a dose of IV Unasyn at OSH. Will complete course of Augmentin on admission with plan to transition to IV ABX if any fever. Completed Augmentin 4/1 (home regimen)     Visual Floaters: Onset 3/23 in setting of new leukocytosis and thrombocytopenia. Per patient, they were fleeting and resolved before admission.      HTN, Chronic: Home regimen of Losartan 50mg . Holding on admission given TLS risk,  - Consider alternative antihypertensive if indicated.    Constipation: Reports BM every 2-3 days at baseline. Given hx, opioid use for pain control, and initiation of vincristine, will add bowel regimen.  - Miralax BID sched     Anxiety: Home regimen of Lexapro 10mg  every day.   - Lexapro 10mg  every day      Seasonal Allergies: No home regimen      Cancer related fatigue:  Patient endorses fatigue with onset of cancer symptoms or treatment. Symptoms started 1 weeks ago. Primary symptoms are severe lethargy and DOE.  - PT/OT on admission, appreciate recs     Immunocompromised status: Patient is immunocompromised secondary to disease or chemotherapy  -  Antimicrobial prophylaxis as above     Impending Electrolyte Abnormality Secondary to Chemotherapy and/or IV Fluids  -Daily Electrolyte monitoring  -Replete per [facility] guidelines.      Anemia/thrombocytopenia secondary to disease:  - Transfuse 1 unit of pRBCs for hgb >7  - Transfuse 1 unit plt for plts >10K     Nutrition:       Subjective:   Afebrile, [name]. No complaints.     10 point ROS otherwise negative except as above in the HPI. Objective:     Vital signs in last 24 hours:  Temp:  [36.8 ??C (98.2 ??F)-37.4 ??C (99.3 ??F)] 37.4 ??C (99.3 ??F)  Heart Rate:  [78-103] 103  Resp:  [16-18] 16  BP: (111-136)/(80-99) 111/85  MAP (mmHg):  [94-103] 94  SpO2:  [97 %-100 %] 99 %    Intake/Output last 3 shifts:  I/O last 3 completed shifts:  In: 700 [I.V.:70; Blood:630]  Out: 6500 [Urine:6500]    Meds:  Current Facility-Administered Medications   Medication Dose Route Frequency Provider Last Rate Last Admin    acetaminophen (TYLENOL) tablet 650 mg  650 mg Oral Q6H PRN Maryanna Shape, PA        butalbital-acetaminophen-caffeine [facility]) per tablet 1 tablet  1 tablet Oral Q6H PRN Court Joy Churchwell, PA   1 tablet at 11/03/21 1343    CHEMO CLARIFICATION ORDER   Other Continuous PRN Maryanna Shape, PA        CHEMO CLARIFICATION ORDER   Other Continuous PRN Lu Duffel, PA        dasatinib [facility]) tablet 140 mg  140 mg Oral Daily [name], MD   140 mg at 11/03/21 0908    [START ON 11/06/2021] dexAMETHasone (DECADRON) tablet 40 mg  40 mg Oral Q24H [name], MD        [START ON 11/13/2021] dexAMETHasone (DECADRON) tablet 40 mg  40 mg Oral Q24H [name], MD        Melene Muller ON 11/20/2021] dexAMETHasone (DECADRON) tablet 40 mg  40 mg Oral Q24H [name], MD        dextromethorphan [facility]) ER oral suspension  60 mg Oral Q12H PRN Court Joy [place], PA   60 mg at 11/02/21 2116    diclofenac sodium (VOLTAREN) 1 % gel 2 g  2 g Topical QID PRN Maryanna Shape, PA        diphenhydrAMINE (BENADRYL) injection 25 mg  25 mg Intravenous Q4H PRN [name], MD        emollient combination no.92 (LUBRIDERM) lotion 1 application  1 application Topical Q1H PRN Mariana Kaufman, PA        EPINEPHrine (EPIPEN) injection 0.3 mg  0.3 mg Intramuscular Daily PRN [name], MD        escitalopram oxalate (LEXAPRO) tablet 10 mg  10 mg Oral Daily Alvis Lemmings [place], [place] 10 mg at 11/03/21 1610    famotidine (PF) (PEPCID) injection 20 mg  20 mg Intravenous Q4H PRN [name], MD        IP OKAY TO TREAT   Other Continuous PRN Lu Duffel, PA        lidocaine (LIDODERM) 5 % patch 1 patch  1 patch Transdermal Daily Maryanna Shape, PA   1 patch at 10/30/21 0920    lidocaine (LIDODERM) 5 % patch 1 patch  1 patch Transdermal Daily Maryanna Shape, [place]   1 patch at 10/30/21 [phone]  loperamide (IMODIUM) capsule 2 mg  2 mg Oral Q2H PRN Mariana Kaufman, PA        loperamide (IMODIUM) capsule 4 mg  4 mg Oral Once PRN Mariana Kaufman, PA        LORazepam (ATIVAN) tablet 0.5 mg  0.5 mg Oral Q8H PRN Maryanna Shape, PA   0.5 mg at 11/03/21 1338    menthol (COUGH DROPS) lozenge 1 lozenge  1 lozenge Oral Q2H PRN Amber Alessandra Grout, [place]   1 lozenge at 11/03/21 0554    meperidine (DEMEROL) injection 25 mg  25 mg Intravenous Q30 Min PRN [name], MD        methotrexate (PRESERVATIVE FREE) 15 mg in sodium chloride (NS) 0.9 % 5.4 mL INTRATHECAL syringe   Intrathecal Once [name], MD        methylPREDNISolone sodium succinate (PF) (Solu-MEDROL) injection 125 mg  125 mg Intravenous Q4H PRN [name], MD        oxyCODONE (ROXICODONE) immediate release tablet 5 mg  5 mg Oral Q4H PRN Mariana Kaufman, PA   5 mg at 11/02/21 2306    Or    oxyCODONE (ROXICODONE) immediate release tablet 10 mg  10 mg Oral Q4H PRN Mariana Kaufman, PA   10 mg at 10/30/21 2033    polyethylene glycol (MIRALAX) packet 17 g  17 g Oral BID Lu Duffel, [place]   17 g at 10/30/21 2034    prochlorperazine (COMPAZINE) injection 10 mg  10 mg Intravenous Q6H PRN [name], MD        prochlorperazine (COMPAZINE) tablet 10 mg  10 mg Oral Q6H PRN [name], MD   10 mg at 11/03/21 1343    sodium chloride (NS) 0.9 % flush 10 mL  10 mL Intravenous Q8H [name], MD   10 mL at 11/03/21 0555    sodium chloride (NS) 0.9 % flush 10 mL  10 mL Intravenous Q8H [name], MD   10 mL at 11/03/21 0554    sodium chloride (NS) 0.9 % infusion  20 mL/hr Intravenous Continuous PRN [name], MD        sodium chloride 0.9% (NS) bolus 1,000 mL  1,000 mL Intravenous Daily PRN [name], MD        valACYclovir (VALTREX) tablet 500 mg  500 mg Oral Daily Maryanna Shape, PA   500 mg at 11/03/21 0981    vinCRIStine (ONCOVIN) 2 mg in sodium chloride (NS) 0.9 % 25 mL IVPB  2 mg Intravenous Q7 Days [name], MD   Stopped at 10/30/21 1753       Physical Exam:  General: Resting, in no apparent distress, sitting in bed with family at bedside  HEENT:  PERRL. No scleral icterus or conjunctival injection. MMM without ulceration, erythema or exudate.   Heart:  RRR. S1, S2. No murmurs, gallops, or rubs.  Lungs:  Breathing is unlabored, and patient is speaking full sentences with ease.  No stridor.  CTAB. No rales, ronchi, or crackles.    Abdomen:  No distention or pain on palpation.  Bowel sounds are present and normoactive x 4.    Skin:  No rashes.  No areas of skin breakdown. Warm to touch, dry, smooth, and even. Petechiae present diffusely, more apparent around ankles. Dog bite to right thigh, improving per patient, no signs of infection.  Musculoskeletal:  No grossly-evident joint effusions or deformities.  Range of motion about the shoulder, elbow, hips and knees is grossly normal.  Psychiatric:  Range of affect is appropriate.    Neurologic:  Alert and oriented to person, place, time and situation.  CNII-CNXII grossly intact.  Extremities:  Appear well-perfused. No clubbing, edema, or cyanosis.  CVAD: Left DL PICC, c/d/i      Labs:  Recent Labs     10/31/21  1741 10/31/21  2237 11/01/21  0117 11/02/21  0021 11/03/21  0028   WBC 9.6  --  4.7 1.0* 1.4*   NEUTROABS  --   --  1.0* 0.6* 0.6*   LYMPHSABS  --   --  3.6 0.3* 0.8*   HGB 7.0*  --  7.3* 6.7* 7.4*   HCT 20.3*  --  20.7* 18.9* 20.7*   PLT 7*   < > 32* 18* 55*   CREATININE 0.58*  --  0.52* 0.51* 0.63   BUN 24*  --  22 19 20    BILITOT  --   --  0.6 0.7 0.9   BILIDIR  --   --  0.20 0.20 0.30   AST  --   --  37* 27 29   ALT  --   --  94* 79* 73*   ALKPHOS  --   --  189* 163* 144*   K 4.4  --  4.4 4.4 4.2   CALCIUM 7.3*  --  8.3* 8.6* 9.0   NA 141  --  138 137 139   CL 110*  --  107 106 106   CO2 24.0  --  24.0 23.0 26.0   PHOS 3.9  --  4.2 4.4  --    INR  --   --  0.92  --   --     < > = values in this interval not displayed.         Imaging:  None.     Irena Cords, PA-C  11/03/2021  1:51 PM   Hematology/Oncology Department   [facility]   Group pager: [phone]

## 2021-11-04 DIAGNOSIS — C9100 Acute lymphoblastic leukemia not having achieved remission: Principal | ICD-10-CM

## 2021-11-04 LAB — HEPATIC FUNCTION PANEL
ALBUMIN: 3.2 g/dL — ABNORMAL LOW (ref 3.4–5.0)
ALKALINE PHOSPHATASE: 143 U/L — ABNORMAL HIGH (ref 46–116)
ALT (SGPT): 68 U/L — ABNORMAL HIGH (ref 10–49)
AST (SGOT): 26 U/L (ref <=34)
BILIRUBIN DIRECT: 0.3 mg/dL (ref 0.00–0.30)
BILIRUBIN TOTAL: 0.9 mg/dL (ref 0.3–1.2)
PROTEIN TOTAL: 6.3 g/dL (ref 5.7–8.2)

## 2021-11-04 LAB — BASIC METABOLIC PANEL
ANION GAP: 6 mmol/L (ref 5–14)
BLOOD UREA NITROGEN: 26 mg/dL — ABNORMAL HIGH (ref 9–23)
BUN / CREAT RATIO: 39
CALCIUM: 9.1 mg/dL (ref 8.7–10.4)
CHLORIDE: 105 mmol/L (ref 98–107)
CO2: 28 mmol/L (ref 20.0–31.0)
CREATININE: 0.66 mg/dL
EGFR CKD-EPI (2021) FEMALE: >90 mL/min/{1.73_m2} (ref >=60)
GLUCOSE RANDOM: 97 mg/dL (ref 70–179)
POTASSIUM: 4.2 mmol/L (ref 3.5–5.1)
SODIUM: 139 mmol/L (ref 135–145)

## 2021-11-04 LAB — CBC W/ AUTO DIFF
BASOPHILS ABSOLUTE COUNT: 0 10*9/L (ref 0.0–0.1)
BASOPHILS RELATIVE PERCENT: 0.1 %
EOSINOPHILS ABSOLUTE COUNT: 0 10*9/L (ref 0.0–0.5)
EOSINOPHILS RELATIVE PERCENT: 0.6 %
HEMATOCRIT: 21.7 % — ABNORMAL LOW (ref 34.0–44.0)
HEMOGLOBIN: 7.8 g/dL — ABNORMAL LOW (ref 11.3–14.9)
LYMPHOCYTES ABSOLUTE COUNT: 1.1 10*9/L (ref 1.1–3.6)
LYMPHOCYTES RELATIVE PERCENT: 64.1 %
MEAN CORPUSCULAR HEMOGLOBIN CONC: 35.9 g/dL (ref 32.0–36.0)
MEAN CORPUSCULAR HEMOGLOBIN: 30.4 pg (ref 25.9–32.4)
MEAN CORPUSCULAR VOLUME: 84.8 fL (ref 77.6–95.7)
MEAN PLATELET VOLUME: 7.5 fL (ref 6.8–10.7)
MONOCYTES ABSOLUTE COUNT: 0 10*9/L — ABNORMAL LOW (ref 0.3–0.8)
MONOCYTES RELATIVE PERCENT: 1.4 %
NEUTROPHILS ABSOLUTE COUNT: 0.6 10*9/L — ABNORMAL LOW (ref 1.8–7.8)
NEUTROPHILS RELATIVE PERCENT: 33.8 %
PLATELET COUNT: 35 10*9/L — ABNORMAL LOW (ref 150–450)
RED BLOOD CELL COUNT: 2.56 10*12/L — ABNORMAL LOW (ref 3.95–5.13)
RED CELL DISTRIBUTION WIDTH: 13.6 % (ref 12.2–15.2)
WBC ADJUSTED: 1.7 10*9/L — ABNORMAL LOW (ref 3.6–11.2)

## 2021-11-04 LAB — SLIDE REVIEW

## 2021-11-04 MED ADMIN — butalbital-acetaminophen-caffeine (ESGIC) per tablet 1 tablet: 1 | ORAL | @ 20:00:00

## 2021-11-04 MED ADMIN — sodium chloride (NS) 0.9 % flush 10 mL: 10 mL | INTRAVENOUS | @ 17:00:00

## 2021-11-04 MED ADMIN — lidocaine (LIDODERM) 5 % patch 1 patch: 1 | TRANSDERMAL | @ 17:00:00

## 2021-11-04 MED ADMIN — escitalopram oxalate (LEXAPRO) tablet 10 mg: 10 mg | ORAL | @ 14:00:00

## 2021-11-04 MED ADMIN — dasatinib (SPRYCEL) tablet 140 mg: 140 mg | ORAL | @ 14:00:00

## 2021-11-04 MED ADMIN — oxyCODONE (ROXICODONE) immediate release tablet 5 mg: 5 mg | ORAL | @ 22:00:00 | Stop: 2021-11-10

## 2021-11-04 MED ADMIN — valACYclovir (VALTREX) tablet 500 mg: 500 mg | ORAL | @ 14:00:00

## 2021-11-04 MED ADMIN — oxyCODONE (ROXICODONE) immediate release tablet 5 mg: 5 mg | ORAL | @ 05:00:00 | Stop: 2021-11-10

## 2021-11-04 MED ADMIN — oxyCODONE (ROXICODONE) immediate release tablet 5 mg: 5 mg | ORAL | @ 12:00:00 | Stop: 2021-11-10

## 2021-11-04 NOTE — Unmapped (Signed)
Pharmacy: First Cycle Chemotherapy Patient Education  Patricia [name] is a 47 y.o. year old with ALL who was admitted to the [facility] and will receive their first cycle of chemotherapy. Patient education reviewing the chemotherapy is provided to the patient by the oncology pharmacy team.  Patient and husband were present for education.      Chemotherapy regimen/agents: [facility] 2005  Day 1 of chemotherapy: 10/30/21  Central Venous Access Device: PICC    For each of the following items listed below and where applicable, signs and symptoms of adverse effects as well as self-care management were reviewed. Additionally, chemotherapy scheduling, chemotherapy agents, mechanism of action, exposure risk mitigation for caregivers, drug-drug/food/herbal interactions and supportive care measures were also reviewed with the patient.     Side effects discussed included but were not limited to: neutropenia/febrile neutropenia, thrombocytopenia, anemia, constipation, peripheral neuropathy and fertility risk/pregnancy precautions.    A/P:  1. The patient verbalized understanding of this information. The patient's drug profile was reviewed for drug interactions with the chemotherapy and no interactions were present at the time of review. The patient was given educational materials that included resources from the [facility] patient education library, HOPA, Lexi-Comp, EPIC, Product/process development scientist. Patient education videos from [employer] were offered.    Approximate time spent with patient: 20 minutes.    Thank you, please feel free to contact with any questions or concerns.    Oris Drone,  PharmD

## 2021-11-04 NOTE — Unmapped (Signed)
Hematology/Oncology APP Daily Progress Note      Active Problems:    Leukocytosis    B-cell acute lymphoblastic leukemia (ALL) (CMS-HCC)       LOS: 8 days     Assessment/Plan: Patricia [name] is a 46 y.o. female with no significant PMHx who was transferred from OSH due concern for Acute Leukemia.    Plan Summary: D6=4/6 [id number] with Dasatinib 140 mg daily. Valtrex ppx. S/p IT chemotherapy 4/5, awaiting hemepath. Awaiting Dasatanib PA. D8 VCR and D8-D9 Dexamethasone re-timed for 4/7 in preparation for possible discharge if Dasatanib access approved. Plan to defer IT triple chemotherapy in favor of single IT agent for remaining procedures given steroid shortage, due for next IT chemotherapy 4/14 & 4/21. Outpatient follow up requested. Chronic Shoulder/Back pain, management per below, palliative following. Reported on-going Visual Floaters, Optho c/s for exam. PT recommends 3x/weekly community ambulator, OT services not indicated. Plan to discharge with PICC, CM working to arrange line care. Other issues, management per below.     Ph (+) B-Cell ALL: Patient with 1 week history of night sweats, intermittent low grade fevers, easy bruising, fatigue, HA and visual floaters who presented to OSH. Patient found to have WBC 54.8 with concern for peripheral blasts. Hgb 10.5, Plts 14. LDH 1646. Started on Hydrea and transitioned to prednisone pre-phase once ALL was confirmed. 3/31 BMBx with B-ALL, BCR-ABL positive. Heb B immunized, Hep C/HIV NR. G6PD 6.5. ECHO EF 60-65%. Patient declined fertility preservation. PICC line placed 10/28/21. D1=10/30/21 GRAAPH-2005 with Dasatanib. Given steroid shortage triple IT chemotherapy deferred, received Methotrexate IT on 4/5, hematopathology pending.   - D1=10/30/21 GRAAPH-2005; D8 VCR/D8-D9 Dexamethasone re-timed to start 4/7 in preparation for possible discharge pending Dasatanib access  - Dasatanib 140 mg daily (will DR to 70 mg daily on discharge)  [ ]  4/6 S/p IT MTX, hemepath pending (deferred Triple IT given steroid shortage)  - Valtrex ppx; If ANC <0.5 will need Levaquin & Fluconazole ppx  - Awaiting Dasatanib PA approval  [ ]  Plan for Ara-c IT on 4/14 and MTX IT on 4/21 (OP orders placed, needs signature)    Disposition:  - 4/10 TOC with [name], NP and Leukemia CPP  - 4/14 & 4/21 VCR/IT Chemotherapy and Labs with possible support- Requested  - 5/4 Follow Up with Dr. Senaida Ores  - Port placement- Requested for week of 5/8  - Palliative Care paged 4/6 to arrange follow up indicated  - CM working to arrange PICC line care  - Patient will need to be given community referral for PT, CM to arrange DME (shower chair w/back)  - Valtrex & Bactrim ppx; Levaquin & Fluconazole if ANC <0.5    Visual Floaters - HA: Patient reported visual floaters on admission. 4/6 Reports floaters persist to right eye, denies any occular pain or headaches.   [ ]  4/6 Optho consulted for evaluation   - Esgic PRN    Acute on Chronic Right Shoulder/Back Pain: Endorses chronic right shoulder pain since 2020. Endorses having been on multiple medication/pain regimens with little benefit. Patient states her shoulder became acutely painful in early March which is different in character from baseline discomfort. States she had a steroid injection in early 09/2021 with some benefit but has not lasted. States pain is only relieved by steroids and ice packs. Pain refractory to Tramadol, Tizanidine, Methocarbamol, and Ibuprofen. OSH MRI without acute pathology.   - Lidocaine patches to L/C spine  - Oxycodone 5-10 PO PRN  - Voltaren gel PRN    -  Palliative care following     Grade 1 Tranasminitis: Likely secondary to underlying possible leukemic process vs medication induced. Patient states she has been on a large medication regimen for treating her shoulder pain since early March 2023. RUQ [place] at OSH with slightly increased echogenicity suggesting mild steatosis. No focal lesions.  - Hepatic function panel daily     Recent Dog Bite: Patient with recent dog bite and treated with Augmentin 875-125 mg (3/23-4/1) with plan for 10 day course. Patient also received a dose of IV Unasyn at OSH.      Chronic HTN: Home regimen of Losartan 50 mg. Held on admission given TLS risk.  - Holding home Losartan    Constipation: Reports BM every 2-3 days at baseline. Given hx, opioid use for pain control, and initiation of vincristine, bowel regimen started.  - Miralax BID scheduled   - Senna HS PRN     Anxiety: Home regimen of Lexapro 10 mg every day. Ativan 0.5 mg PO TID PRN started 3/30 per palliative care recommendations.   - Lexapro 10mg  every day   - Ativan 0.5 mg TID PRN     Seasonal Allergies: No home regimen.      Cancer Related Fatigue:   - PT/OT following; PT recommends 3x/weekly community Ambulator, PT no services indicated      Immunocompromised Status Secondary to Disease:   - Antimicrobial prophylaxis as above     Pancytopenia Secondary to Disease  - Transfuse 1 unit of pRBCs for hgb <7  - Transfuse 1 unit plt for plts <10K   - Irradiated & Leukoreduced products preferred    Subjective:   Interval History: [name]. Pain controlled. Reports on-going visual floaters to right eye. No HA. Updated on plan of care.     10 point ROS otherwise negative except as above in the HPI.     Objective:     Vital signs in last 24 hours:  Temp:  [37.1 ??C (98.8 ??F)-37.4 ??C (99.3 ??F)] 37.1 ??C (98.8 ??F)  Heart Rate:  [101-103] 101  Resp:  [16-18] 18  BP: (110-115)/(74-85) 110/75  MAP (mmHg):  [86-94] 86  SpO2:  [98 %-99 %] 99 %    Intake/Output last 3 shifts:  I/O last 3 completed shifts:  In: 350 [I.V.:30; Blood:320]  Out: 2820 [Urine:2820]    Meds:  Current Facility-Administered Medications   Medication Dose Route Frequency Provider Last Rate Last Admin   ??? acetaminophen (TYLENOL) tablet 650 mg  650 mg Oral Q6H PRN Maryanna Shape, PA       ??? butalbital-acetaminophen-caffeine [facility]) per tablet 1 tablet  1 tablet Oral Q6H PRN Court Joy Churchwell, PA 1 tablet at 11/03/21 1343   ??? CHEMO CLARIFICATION ORDER   Other Continuous PRN Maryanna Shape, PA       ??? CHEMO CLARIFICATION ORDER   Other Continuous PRN Lu Duffel, PA       ??? CHEMO CLARIFICATION ORDER   Other Continuous PRN Sol Passer Tommi Rumps, NP       ??? dasatinib [facility]) tablet 140 mg  140 mg Oral Daily [name], MD   140 mg at 11/04/21 0940   ??? [START ON 11/05/2021] dexAMETHasone (DECADRON) tablet 40 mg  40 mg Oral Q24H [name], MD       ??? [START ON 11/13/2021] dexAMETHasone (DECADRON) tablet 40 mg  40 mg Oral Q24H [name], MD       ??? [START ON 11/20/2021] dexAMETHasone (DECADRON)  tablet 40 mg  40 mg Oral Q24H [name], MD       ??? dextromethorphan [facility]) ER oral suspension  60 mg Oral Q12H PRN Court Joy Churchwell, PA   60 mg at 11/02/21 2116   ??? diclofenac sodium (VOLTAREN) 1 % gel 2 g  2 g Topical QID PRN Maryanna Shape, PA       ??? diphenhydrAMINE (BENADRYL) injection 25 mg  25 mg Intravenous Q4H PRN [name], MD       ??? emollient combination no.92 (LUBRIDERM) lotion 1 application  1 application Topical Q1H PRN Mariana Kaufman, PA       ??? EPINEPHrine (EPIPEN) injection 0.3 mg  0.3 mg Intramuscular Daily PRN [name], MD       ??? escitalopram oxalate (LEXAPRO) tablet 10 mg  10 mg Oral Daily Alvis Lemmings [place], [place]   10 mg at 11/04/21 9562   ??? famotidine (PF) (PEPCID) injection 20 mg  20 mg Intravenous Q4H PRN [name], MD       ??? IP OKAY TO TREAT   Other Continuous PRN Lu Duffel, PA       ??? lidocaine (LIDODERM) 5 % patch 1 patch  1 patch Transdermal Daily Maryanna Shape, PA   1 patch at 10/30/21 0920   ??? lidocaine (LIDODERM) 5 % patch 1 patch  1 patch Transdermal Daily Maryanna Shape, [place]   1 patch at 10/30/21 0919   ??? loperamide (IMODIUM) capsule 2 mg  2 mg Oral Q2H PRN Mariana Kaufman, PA       ??? loperamide (IMODIUM) capsule 4 mg  4 mg Oral Once PRN Mariana Kaufman, PA       ??? LORazepam (ATIVAN) tablet 0.5 mg  0.5 mg Oral Q8H PRN Maryanna Shape, PA   0.5 mg at 11/03/21 1338   ??? menthol (COUGH DROPS) lozenge 1 lozenge  1 lozenge Oral Q2H PRN Amber Alessandra Grout, [place]   1 lozenge at 11/03/21 0554   ??? meperidine (DEMEROL) injection 25 mg  25 mg Intravenous Q30 Min PRN [name], MD       ??? methylPREDNISolone sodium succinate (PF) (Solu-MEDROL) injection 125 mg  125 mg Intravenous Q4H PRN [name], MD       ??? oxyCODONE (ROXICODONE) immediate release tablet 5 mg  5 mg Oral Q4H PRN Mariana Kaufman, PA   5 mg at 11/04/21 1308    Or   ??? oxyCODONE (ROXICODONE) immediate release tablet 10 mg  10 mg Oral Q4H PRN Mariana Kaufman, PA   10 mg at 10/30/21 2033   ??? polyethylene glycol (MIRALAX) packet 17 g  17 g Oral BID Tristan Bramble [place], [place]   17 g at 10/30/21 2034   ??? prochlorperazine (COMPAZINE) injection 10 mg  10 mg Intravenous Q6H PRN [name], MD       ??? prochlorperazine (COMPAZINE) tablet 10 mg  10 mg Oral Q6H PRN [name], MD   10 mg at 11/03/21 1343   ??? sodium chloride (NS) 0.9 % flush 10 mL  10 mL Intravenous Q8H [name], MD   10 mL at 11/03/21 2135   ??? sodium chloride (NS) 0.9 % flush 10 mL  10 mL Intravenous Q8H [name], MD   10 mL at 11/03/21 2136   ??? sodium chloride (NS) 0.9 % infusion  20 mL/hr Intravenous Continuous PRN [name], MD       ???  sodium chloride 0.9% (NS) bolus 1,000 mL  1,000 mL Intravenous Daily PRN [name], MD       ??? valACYclovir (VALTREX) tablet 500 mg  500 mg Oral Daily Maryanna Shape, [place]   500 mg at 11/04/21 8119   ??? vinCRIStine (ONCOVIN) 2 mg in sodium chloride (NS) 0.9 % 25 mL IVPB  2 mg Intravenous Q7 Days [name], MD   Stopped at 10/30/21 1753     Physical Exam:  General: Alert & Oriented, in no apparent distress.  HEENT:  PERRL. No scleral icterus or conjunctival injection. MMM without ulceration, erythema or exudate.   Heart:  RRR. S1, S2. No murmurs, gallops, or rubs.  Lungs:  Breathing is unlabored, and patient is speaking full sentences with ease.  No stridor. CTAB. No rales, ronchi, or crackles.    Abdomen:  No distention or pain on palpation.  Bowel sounds are present and normoactive x 4.    Skin:  No rashes.  No areas of skin breakdown. Warm to touch, dry, smooth, and even. Petechiae present diffusely, more apparent around ankles. Dog bite to right thigh, improving per patient, no signs of infection.  Musculoskeletal:  No grossly-evident joint effusions or deformities.  Range of motion about the shoulder, elbow, hips and knees is grossly normal.  Psychiatric:  Range of affect is appropriate.    Neurologic:  Alert and oriented to person, place, time and situation.  CNII-CNXII grossly intact.  Extremities:  Appear well-perfused. No clubbing, edema, or cyanosis.  CVAD: DL PICC, C/D/I.    Labs:  Recent Labs     11/02/21  0021 11/03/21  0028 11/04/21  0032   WBC 1.0* 1.4* 1.7*   NEUTROABS 0.6* 0.6* 0.6*   LYMPHSABS 0.3* 0.8* 1.1   HGB 6.7* 7.4* 7.8*   HCT 18.9* 20.7* 21.7*   PLT 18* 55* 35*   CREATININE 0.51* 0.63 0.66   BUN 19 20 26*   BILITOT 0.7 0.9 0.9   BILIDIR 0.20 0.30 0.30   AST 27 29 26    ALT 79* 73* 68*   ALKPHOS 163* 144* 143*   K 4.4 4.2 4.2   CALCIUM 8.6* 9.0 9.1   NA 137 139 139   CL 106 106 105   CO2 23.0 26.0 28.0   PHOS 4.4  --   --      Imaging: No new imaging.    Binnie Kand, AGACNP-BC, AOCNP 11/04/2021  11:34 AM   Nurse Practitioner  Hematology/Oncology Department   [facility]   Group Pager: [phone]

## 2021-11-04 NOTE — Unmapped (Signed)
Ophthalmology Consult Note    Requesting Attending Physician: [name]  Service Requesting Consult: Oncology/Hematology (MDE)   Consult Attending Physician: Dr. Cleophas Dunker    Assessment:  46 y.o. female with a h/o ALL who is admitted for ALL treatment. Has c/o of black spots in her vision.     Ophthalmology was consulted for assistance with evaluation and management.    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 female whom we are asked to see in consultation for the above.    Symptom:   Black spots in vision     Symptom onset:  Sudden   Symptom duration:  2 days  Symptom progression: Stable   Symptom quality:  mild  Symptom relief/aggravation: None tried      History:    Past Ocular History:  - negative     Past Medical History:  Past Medical History:   Diagnosis Date   ??? 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    Family History:  - negative family ocular history     Social History:  She  reports that she has never smoked. She has never used smokeless tobacco.    - negative tobacco use.    Medications:  Scheduled Meds:   ??? dasatinib  140 mg Oral Daily   ??? [START ON 11/05/2021] dexAMETHasone  40 mg Oral Q24H   ??? [START ON 11/13/2021] dexAMETHasone  40 mg Oral Q24H   ??? [START ON 11/20/2021] dexAMETHasone  40 mg Oral Q24H   ??? escitalopram oxalate  10 mg Oral Daily   ??? lidocaine  1 patch Transdermal Daily   ??? lidocaine  1 patch Transdermal Daily   ??? polyethylene glycol  17 g Oral BID   ??? sodium chloride  10 mL Intravenous Q8H   ??? sodium chloride  10 mL Intravenous Q8H   ??? valACYclovir  500 mg Oral Daily   ??? vinCRIStine (ONCOVIN) IVPB  2 mg Intravenous Q7 Days     Continuous Infusions:   ??? Chemo Clarification Order     ??? Chemo Clarification Order     ??? Chemo Clarification Order     ??? IP okay to treat     ??? sodium chloride       PRN Meds: acetaminophen, butalbital-acetaminophen-caffeine, Chemo Clarification Order, Chemo Clarification Order, Chemo Clarification Order, dextromethorphan, diclofenac sodium, diphenhydrAMINE, emollient combination no.92, EPINEPHrine IM, famotidine (PEPCID) IV, IP okay to treat, loperamide, loperamide, LORazepam, cough/sore throat lozenge, meperidine, methylPREDNISolone sodium succinate (PF), oxyCODONE **OR** oxyCODONE, prochlorperazine, prochlorperazine, senna, sodium chloride, sodium chloride 0.9%    Allergies:  Allergies   Allergen Reactions   ??? Erythromycin Hives   ??? Sulfa (Sulfonamide Antibiotics) Anaphylaxis   ??? Azithromycin        Review of Systems:  12 systems reviewed and negative unless otherwise stated in HPI or recent HPI    Physical Exam:  Vitals:    11/04/21 0419 11/04/21 1000 11/04/21 1716 11/04/21 2050   BP: 110/75 97/76 102/75 113/67   Pulse: 101 97 91 114   Resp: 18 18 18 18    Temp: 37.1 ??C (98.8 ??F) 36.4 ??C (97.5 ??F) 37.3 ??C (99.1 ??F) 37.5 ??C (99.5 ??F)   TempSrc: Oral Oral Oral Oral   SpO2: 99% 96% 97% 99%   Weight:       Height:           General:   No acute distress    Neuro/Psych:  Alert and oriented to person, place, and time    Ophthalmic Exam:  Base Eye Exam     Visual Acuity (Snellen - Linear)       Right Left    [place] 20/25 phni 20/25 phni          Tonometry (Tonopen, 2:54 PM)       Right Left    Pressure 20 18          Pupils       Pupils    Right PERRL    Left PERRL          Extraocular Movement       Right Left     Full Full          Neuro/Psych     Oriented x3: Yes    Mood/Affect: Normal Dilation     Both eyes: 1% Tropicamide, 2.5% Phenylephrine @ 2:55 PM            Slit Lamp and Fundus Exam     External Exam       Right Left    External Normal Normal          Slit Lamp Exam       Right Left    Lids/Lashes Normal Normal    Conjunctiva/Sclera White and quiet White and quiet    Cornea Clear Clear    Anterior Chamber Deep and quiet Deep and quiet    Iris Round and reactive Round and reactive    Lens Clear Clear    Vitreous Normal Normal          Fundus Exam       Right Left    Disc Normal Normal    C/D Ratio 0.1 0.1    Macula DBHs DBHs    Vessels Normal Normal    Periphery DBHs DBHs                Diagnostic Testing:  Reviewed   _________________________________________________________________    Thank you for this consultation.    Please page on-call or consult resident with any questions.    [facility] may be reached at [phone]

## 2021-11-04 NOTE — Unmapped (Addendum)
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 female with no significant PMHx who was transferred from OSH due concern for Acute Leukemia. 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 D1=10/30/21 GRAAPH-2005. LP with IT MTX (deferring triple therapy in light on steroid shortage), negative for disease. She discharged on D7. She will receive D13 Vincristine on 4/13 and 2nd IT chemotherapy (Ara-c) on 4/14. She will then receive D20 VCR and 3rd IT chemotherapy on 4/20 (MTX). She will take Dexamethasone D8 on 4/8 and Dexamethasone on D13-D14 on 4/13-4/14 and Dexamethasone D20-D21 on 4/20-4/21 to align with VCR days. These days are off normal protocol but this was required given scheduling limitations. She will continue Dasatanib 140 mg daily (dispensed on discharge). She will continue Valtrex ppx. ANC 0.6 on discharge, will need monitoring in case further ppx start is indicated. She will return on 4/10 to see [name], NP and CPP for TOC. She will have labs on 4/10, 4/13 and then weekly beginning 4/20. She has a BMBx on 5/1 and follow up with Dr. Senaida Ores on 5/4. We have requested a port placement week of 5/8. Her detailed hospital course is outlined below.    Ph (+) B-Cell ALL: Patient with 1 week history of night sweats, intermittent low grade fevers, easy bruising, fatigue, HA and visual floaters who presented to OSH. Patient found to have WBC 54.8 with concern for peripheral blasts. Hgb 10.5, Plts 14. LDH 1646. Started on Hydrea and transitioned to prednisone pre-phase once ALL was confirmed. 3/31 BMBx with B-ALL, BCR-ABL positive. Heb B immunized, Hep C/HIV NR. G6PD 6.5. ECHO EF 60-65%. Patient declined fertility preservation. PICC line placed 10/28/21 and removed 4/7 prior to discharge given superficial thrombus and patient discomfort. D1=10/30/21 GRAAPH-2005. LP with IT MTX (deferring triple therapy in light on steroid shortage), negative for disease. She discharged on D7. She will receive D13 Vincristine on 4/13 and 2nd IT chemotherapy (Ara-c) on 4/14. She will then receive D20 VCR and 3rd IT chemotherapy on 4/20 (MTX). She will take Dexamethasone D8 on 4/8 and Dexamethasone on D13-D14 on 4/13-4/14 and Dexamethasone D20-D21 on 4/20-4/21 to align with VCR days. These days are off normal protocol but this was required given scheduling limitations. She will continue Dasatanib 140 mg daily (dispensed on discharge). She will continue Valtrex ppx. ANC 0.6 on discharge, will need monitoring in case further ppx start is indicated. She will return on 4/10 to see [name], NP and CPP for TOC. She will have labs on 4/10, 4/13 and then weekly beginning 4/20. She has a BMBx on 5/1 and follow up with Dr. Senaida Ores on 5/4. We have requested a port placement week of 5/8.      Visual Floaters - HA: Patient reported visual floaters on admission. 4/6 Reports floaters persist to right eye, denies any occular pain or headaches. Optho assessed and noted bilateral retinal hemorrhages, no specific recommendations other than to transfuse per oncology parameters.      Superficial Thrombus: Reported bruising and discomfort to PICC site overnight 4/6. PVL with superficial thrombus in basilic vein, no e/o DVT. PICC line was removed given discomfort and superficial thrombus. We have requested a port placement week of 5/8.     Acute on Chronic Right Shoulder/Back/Neck Pain: Endorses chronic neck, back and shoulder pain since 2020. Endorses having been on multiple medication/pain regimens with little benefit. Patient states her right shoulder became acutely painful in early March which is different in character from baseline discomfort. States she had a steroid injection in early  09/2021 with some benefit but has not lasted. States pain is only relieved by steroids and ice packs. Pain refractory to Tramadol, Tizanidine, Methocarbamol, and Ibuprofen. OSH MRI without acute pathology. Palliative followed while admitted. She will continue Oxycodone 5-10 mg PRN in addition to lidocaine patches and Voltaren gel. She has follow up with Palliative Care on 5/8 (Dr. Ike Bene) and was instructed to follow up with local spine center (had an appointment prior to this admission). She was also given a referral for local ambulatory PT (she was instructed to contact any local agency of her choice). PT recommended shower chair with back, patient reported she had this at home already.     Grade 1 Transaminitis, Improving: Likely secondary to underlying possible leukemic process vs medication induced. Patient states she has been on a large medication regimen for treating her shoulder pain since early March 2023. RUQ [place] at OSH with slightly increased echogenicity suggesting mild steatosis. No focal lesions. LFT's improving on discharge.      Recent Dog Bite: Patient with recent dog bite and treated with Augmentin 875-125 mg (3/23-4/1) with plan for 10 day course. Patient also received a dose of IV Unasyn at OSH.      Chronic HTN: Home regimen of Losartan 50 mg. Held on admission given TLS risk. She was instructed to stop on discharge given normal BP's during entire admission.      Constipation: Reports BM every 2-3 days at baseline. Given hx, opioid use for pain control, and initiation of vincristine, bowel regimen started. She will continue Miralax and Senna PRN and was provided titration instructions on discharge.      Anxiety: Home regimen of Lexapro 10 mg every day. Ativan 0.5 mg PO TID PRN started 3/30 per palliative care recommendations and will continue both on discharge.      Seasonal Allergies: No home regimen.      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

## 2021-11-04 NOTE — Unmapped (Signed)
Pt stable; ongoing pain from neck issues - otherwise doing well. No other complaints. Afebrile and tolerating chemo well.  Possible discharge home tomorrow. Will continue to monitor.   Problem: Adult Inpatient Plan of Care  Goal: Plan of Care Review  Outcome: Ongoing - Unchanged  Goal: Patient-Specific Goal (Individualized)  Outcome: Ongoing - Unchanged  Goal: Absence of Hospital-Acquired Illness or Injury  Outcome: Ongoing - Unchanged  Intervention: Identify and Manage Fall Risk  Recent Flowsheet Documentation  Taken 11/04/2021 1554 by Annell Greening, RN  Safety Interventions:   family at bedside   low bed   nonskid shoes/slippers when out of bed  Taken 11/04/2021 1420 by Annell Greening, RN  Safety Interventions:   family at bedside   low bed   nonskid shoes/slippers when out of bed  Taken 11/04/2021 1320 by Annell Greening, RN  Safety Interventions:   family at bedside   fall reduction program maintained   low bed  Taken 11/04/2021 1110 by Annell Greening, RN  Safety Interventions:   family at bedside   fall reduction program maintained   low bed  Taken 11/04/2021 0920 by Annell Greening, RN  Safety Interventions:   fall reduction program maintained   low bed   lighting adjusted for tasks/safety   family at bedside  Taken 11/04/2021 0730 by Annell Greening, RN  Safety Interventions:   fall reduction program maintained   family at bedside   low bed   nonskid shoes/slippers when out of bed  Intervention: Prevent and Manage VTE (Venous Thromboembolism) Risk  Recent Flowsheet Documentation  Taken 11/04/2021 1554 by Annell Greening, RN  Activity Management: activity adjusted per tolerance  Taken 11/04/2021 1420 by Annell Greening, RN  Activity Management: activity adjusted per tolerance  Taken 11/04/2021 1320 by Annell Greening, RN  Activity Management: activity adjusted per tolerance  Taken 11/04/2021 1110 by Annell Greening, RN  Activity Management: activity adjusted per tolerance  Taken 11/04/2021 0920 by Annell Greening, RN  Activity Management: activity adjusted per tolerance  Taken 11/04/2021 0730 by Annell Greening, RN  Activity Management: activity adjusted per tolerance  Intervention: Prevent Infection  Recent Flowsheet Documentation  Taken 11/04/2021 1554 by Annell Greening, RN  Infection Prevention: cohorting utilized  Taken 11/04/2021 1420 by Annell Greening, RN  Infection Prevention: cohorting utilized  Taken 11/04/2021 1320 by Annell Greening, RN  Infection Prevention: cohorting utilized  Taken 11/04/2021 1110 by Annell Greening, RN  Infection Prevention: cohorting utilized  Taken 11/04/2021 0920 by Annell Greening, RN  Infection Prevention: cohorting utilized  Taken 11/04/2021 0730 by Annell Greening, RN  Infection Prevention:   cohorting utilized   hand hygiene promoted  Goal: Optimal Comfort and Wellbeing  Outcome: Ongoing - Unchanged  Goal: Readiness for Transition of Care  Outcome: Ongoing - Unchanged  Goal: Rounds/Family Conference  Outcome: Ongoing - Unchanged

## 2021-11-04 NOTE — Unmapped (Signed)
[facility] Specialty Medication Onboarding    Specialty Medication: Sprycel  Prior Authorization: Approved   Financial Assistance: Yes - copay card approved as secondary   Final Copay/Day Supply: $0 / 30    Insurance Restrictions: None     Notes to Pharmacist:     The triage team has completed the benefits investigation and has determined that the patient is able to fill this medication at [facility]. Please contact the patient to complete the onboarding or follow up with the prescribing physician as needed.

## 2021-11-04 NOTE — Unmapped (Signed)
A&Ox4, VSS, Afebrile, free from falls/injuries. Denies NVD but c/o pain in neck; shoulder and back; oxy x1 PRN given w/ moderate relief. Scheduled medicines given. Falls precaution taken. No acute events this shift. Visitor at bedside    Vitals:    11/03/21 1727 11/03/21 2146 11/04/21 0034 11/04/21 0419   BP:  112/74 115/84 110/75   Pulse:  102 102 101   Resp:  18 16 18    Temp:  37.4 ??C (99.3 ??F) 37.3 ??C (99.1 ??F) 37.1 ??C (98.8 ??F)   TempSrc:  Oral Oral Oral   SpO2:   98% 99%   Weight: 85.2 kg (187 lb 13.3 oz)      Height:           Problem: Adult Inpatient Plan of Care  Goal: Plan of Care Review  Outcome: Ongoing - Unchanged  Goal: Patient-Specific Goal (Individualized)  Outcome: Ongoing - Unchanged  Goal: Absence of Hospital-Acquired Illness or Injury  Outcome: Ongoing - Unchanged  Intervention: Identify and Manage Fall Risk  Recent Flowsheet Documentation  Taken 11/03/2021 1920 by Georgia Duff, RN  Safety Interventions:   lighting adjusted for tasks/safety   low bed   neutropenic precautions  Intervention: Prevent and Manage VTE (Venous Thromboembolism) Risk  Recent Flowsheet Documentation  Taken 11/03/2021 1920 by Georgia Duff, RN  Activity Management: activity adjusted per tolerance  Intervention: Prevent Infection  Recent Flowsheet Documentation  Taken 11/03/2021 1920 by Georgia Duff, RN  Infection Prevention: hand hygiene promoted  Goal: Optimal Comfort and Wellbeing  Outcome: Ongoing - Unchanged  Goal: Readiness for Transition of Care  Outcome: Ongoing - Unchanged  Goal: Rounds/Family Conference  Outcome: Ongoing - Unchanged

## 2021-11-05 DIAGNOSIS — C9100 Acute lymphoblastic leukemia not having achieved remission: Principal | ICD-10-CM

## 2021-11-05 LAB — BASIC METABOLIC PANEL
ANION GAP: 7 mmol/L (ref 5–14)
BLOOD UREA NITROGEN: 23 mg/dL (ref 9–23)
BUN / CREAT RATIO: 33
CALCIUM: 8.8 mg/dL (ref 8.7–10.4)
CHLORIDE: 102 mmol/L (ref 98–107)
CO2: 28 mmol/L (ref 20.0–31.0)
CREATININE: 0.69 mg/dL
EGFR CKD-EPI (2021) FEMALE: >90 mL/min/{1.73_m2} (ref >=60)
GLUCOSE RANDOM: 159 mg/dL (ref 70–179)
POTASSIUM: 4 mmol/L (ref 3.4–4.8)
SODIUM: 137 mmol/L (ref 135–145)

## 2021-11-05 LAB — SLIDE REVIEW

## 2021-11-05 LAB — HEPATIC FUNCTION PANEL
ALBUMIN: 3.1 g/dL — ABNORMAL LOW (ref 3.4–5.0)
ALKALINE PHOSPHATASE: 130 U/L — ABNORMAL HIGH (ref 46–116)
ALT (SGPT): 68 U/L — ABNORMAL HIGH (ref 10–49)
AST (SGOT): 30 U/L (ref <=34)
BILIRUBIN DIRECT: 0.2 mg/dL (ref 0.00–0.30)
BILIRUBIN TOTAL: 0.7 mg/dL (ref 0.3–1.2)
PROTEIN TOTAL: 6.2 g/dL (ref 5.7–8.2)

## 2021-11-05 LAB — CBC W/ AUTO DIFF
BASOPHILS ABSOLUTE COUNT: 0 10*9/L (ref 0.0–0.1)
BASOPHILS RELATIVE PERCENT: 0.2 %
EOSINOPHILS ABSOLUTE COUNT: 0 10*9/L (ref 0.0–0.5)
EOSINOPHILS RELATIVE PERCENT: 0.8 %
HEMATOCRIT: 19 % — ABNORMAL LOW (ref 34.0–44.0)
HEMOGLOBIN: 6.8 g/dL — ABNORMAL LOW (ref 11.3–14.9)
LYMPHOCYTES ABSOLUTE COUNT: 1.1 10*9/L (ref 1.1–3.6)
LYMPHOCYTES RELATIVE PERCENT: 64.3 %
MEAN CORPUSCULAR HEMOGLOBIN CONC: 35.9 g/dL (ref 32.0–36.0)
MEAN CORPUSCULAR HEMOGLOBIN: 30.6 pg (ref 25.9–32.4)
MEAN CORPUSCULAR VOLUME: 85 fL (ref 77.6–95.7)
MEAN PLATELET VOLUME: 7.2 fL (ref 6.8–10.7)
MONOCYTES ABSOLUTE COUNT: 0 10*9/L — ABNORMAL LOW (ref 0.3–0.8)
MONOCYTES RELATIVE PERCENT: 0.5 %
NEUTROPHILS ABSOLUTE COUNT: 0.6 10*9/L — ABNORMAL LOW (ref 1.8–7.8)
NEUTROPHILS RELATIVE PERCENT: 34.2 %
PLATELET COUNT: 20 10*9/L — ABNORMAL LOW (ref 150–450)
RED BLOOD CELL COUNT: 2.23 10*12/L — ABNORMAL LOW (ref 3.95–5.13)
RED CELL DISTRIBUTION WIDTH: 13.6 % (ref 12.2–15.2)
WBC ADJUSTED: 1.7 10*9/L — ABNORMAL LOW (ref 3.6–11.2)

## 2021-11-05 LAB — PLATELET COUNT: PLATELET COUNT: 26 10*9/L — ABNORMAL LOW (ref 150–450)

## 2021-11-05 MED ORDER — DICLOFENAC 1 % TOPICAL GEL
Freq: Four times a day (QID) | TOPICAL | 0 refills | 13 days | Status: CN | PRN
Start: 2021-11-05 — End: 2022-11-05

## 2021-11-05 MED ORDER — OXYCODONE 5 MG TABLET
ORAL_TABLET | ORAL | 0 refills | 4 days | Status: CP | PRN
Start: 2021-11-05 — End: 2021-11-19
  Filled 2021-11-05: qty 40, 4d supply, fill #0

## 2021-11-05 MED ORDER — OXYCODONE 10 MG TABLET
ORAL_TABLET | ORAL | 0 refills | 4 days | Status: CP | PRN
Start: 2021-11-05 — End: 2021-11-05

## 2021-11-05 MED ORDER — BUTALBITAL-ACETAMINOPHEN-CAFFEINE 50 MG-325 MG-40 MG TABLET
ORAL_TABLET | Freq: Four times a day (QID) | ORAL | 0 refills | 4 days | Status: CP | PRN
Start: 2021-11-05 — End: 2021-11-19
  Filled 2021-11-05: qty 14, 4d supply, fill #0

## 2021-11-05 MED ORDER — DASATINIB 70 MG TABLET
ORAL_TABLET | Freq: Every day | ORAL | 0 refills | 30 days | Status: CP
Start: 2021-11-05 — End: ?
  Filled 2021-11-05: qty 60, 30d supply, fill #0

## 2021-11-05 MED ORDER — POLYETHYLENE GLYCOL 3350 17 GRAM/DOSE ORAL POWDER
Freq: Two times a day (BID) | ORAL | 0 refills | 30 days | Status: CP | PRN
Start: 2021-11-05 — End: 2021-12-05
  Filled 2021-11-05: qty 510, 15d supply, fill #0

## 2021-11-05 MED ORDER — LIDOCAINE 4 % TOPICAL PATCH
MEDICATED_PATCH | Freq: Every day | TRANSDERMAL | 0 refills | 30 days | Status: CP
Start: 2021-11-05 — End: 2021-12-05
  Filled 2021-11-05: qty 10, 10d supply, fill #0

## 2021-11-05 MED ORDER — LORAZEPAM 0.5 MG TABLET
ORAL_TABLET | Freq: Three times a day (TID) | ORAL | 0 refills | 5 days | Status: CP | PRN
Start: 2021-11-05 — End: 2021-11-19
  Filled 2021-11-05: qty 14, 5d supply, fill #0

## 2021-11-05 MED ORDER — VALACYCLOVIR 500 MG TABLET
ORAL_TABLET | Freq: Every day | ORAL | 0 refills | 30 days | Status: CP
Start: 2021-11-05 — End: 2021-12-05
  Filled 2021-11-05: qty 30, 30d supply, fill #0

## 2021-11-05 MED ORDER — SENNOSIDES 8.6 MG TABLET
ORAL_TABLET | Freq: Every evening | ORAL | 0 refills | 30 days | Status: CP | PRN
Start: 2021-11-05 — End: 2021-12-05
  Filled 2021-11-05: qty 30, 30d supply, fill #0

## 2021-11-05 MED ORDER — PROCHLORPERAZINE MALEATE 10 MG TABLET
ORAL_TABLET | Freq: Four times a day (QID) | ORAL | 0 refills | 8 days | Status: CP | PRN
Start: 2021-11-05 — End: 2021-11-19
  Filled 2021-11-05: qty 30, 8d supply, fill #0

## 2021-11-05 MED ADMIN — sodium chloride (NS) 0.9 % flush 10 mL: 10 mL | INTRAVENOUS | @ 01:00:00

## 2021-11-05 MED ADMIN — sodium chloride (NS) 0.9 % flush 10 mL: 10 mL | INTRAVENOUS | @ 10:00:00 | Stop: 2021-11-05

## 2021-11-05 MED ADMIN — dexAMETHasone (DECADRON) tablet 40 mg: 40 mg | ORAL | @ 12:00:00 | Stop: 2021-11-05

## 2021-11-05 MED ADMIN — oxyCODONE (ROXICODONE) immediate release tablet 10 mg: 10 mg | ORAL | @ 12:00:00 | Stop: 2021-11-05

## 2021-11-05 MED ADMIN — oxyCODONE (ROXICODONE) immediate release tablet 10 mg: 10 mg | ORAL | @ 05:00:00 | Stop: 2021-11-05

## 2021-11-05 MED ADMIN — escitalopram oxalate (LEXAPRO) tablet 10 mg: 10 mg | ORAL | @ 14:00:00 | Stop: 2021-11-05

## 2021-11-05 MED ADMIN — vinCRIStine (ONCOVIN) 2 mg in sodium chloride (NS) 0.9 % 25 mL IVPB: 2 mg | INTRAVENOUS | @ 14:00:00 | Stop: 2021-11-05

## 2021-11-05 MED ADMIN — valACYclovir (VALTREX) tablet 500 mg: 500 mg | ORAL | @ 14:00:00 | Stop: 2021-11-05

## 2021-11-05 MED ADMIN — butalbital-acetaminophen-caffeine (ESGIC) per tablet 1 tablet: 1 | ORAL | @ 17:00:00 | Stop: 2021-11-05

## 2021-11-05 MED ADMIN — dasatinib (SPRYCEL) tablet 140 mg: 140 mg | ORAL | @ 14:00:00 | Stop: 2021-11-05

## 2021-11-05 NOTE — Unmapped (Signed)
Pt discharging today. PICC line was removed by MD, PVL revealed small clot. Pt will get port placement outpatient. Pt given 2 units total of RBCs and 1 unit of Platelets prior to discharge. AVS given and reviewed w/ patient. All questions answered.   Problem: Adult Inpatient Plan of Care  Goal: Plan of Care Review  Outcome: Resolved  Goal: Patient-Specific Goal (Individualized)  Outcome: Resolved  Goal: Absence of Hospital-Acquired Illness or Injury  Outcome: Resolved  Intervention: Identify and Manage Fall Risk  Recent Flowsheet Documentation  Taken 11/05/2021 0735 by Annell Greening, RN  Safety Interventions:   family at bedside   low bed   nonskid shoes/slippers when out of bed  Intervention: Prevent and Manage VTE (Venous Thromboembolism) Risk  Recent Flowsheet Documentation  Taken 11/05/2021 0735 by Annell Greening, RN  Activity Management: up ad lib  Intervention: Prevent Infection  Recent Flowsheet Documentation  Taken 11/05/2021 0735 by Annell Greening, RN  Infection Prevention: cohorting utilized  Goal: Optimal Comfort and Wellbeing  Outcome: Resolved  Goal: Readiness for Transition of Care  Outcome: Resolved  Goal: Rounds/Family Conference  Outcome: Resolved

## 2021-11-05 NOTE — Unmapped (Signed)
VENOUS ACCESS TEAM PROCEDURE    Order was placed to assess the site of a PICC line inserted last week with new complain of pain and sensitive to touch at the insertion site. Some bruising around the dressing. Also I measure the patient's are and still correspond to the initial insertion measure of 34cm. There is no discomfort during flushing.  After my assessment I talked to the nurse and recommend to page the provided and ask for an XR of the left arm and PVL's of the left arm to r/o a possibility of a blood clot.   I will report all these to the PICC team in the morning to follow up results and determine what is the next step to correct the problem.    Workup / Procedure Time:  30 minutes        RN was notified.       Thank you,     Michel Santee RN Venous Access Team

## 2021-11-05 NOTE — Unmapped (Signed)
Pharmacist Discharge Note  Reason for writing this note: new diagnosis with new medication, high risk medication, patient requires medication-related outpatient intervention and/or monitoring    Patricia [name] is a 46 y.o. female with no significant PMH who presented with newly diagnosed Ph+ B-cell ALL. Discharge date = 11/05/2021. The following is a summary of medication-related changes that occurred during admission, as well as any pharmacy-related follow-up and medication access needs.     Ph+ B-cell ALL:  - pre-phase prednisone 3/30-4/1  - s/p treatment with GRAAPH-2005 induction initiation, D1 = 10/30/2021; Patricia [name] hospitalization was complicated by no major complications.  - received vincristine D8 on actual Day 7 to facilitate earlier discharge and ability to maintain outpatient appointments on weekdays. Dexamethasone shifted to match up with vincristine days. Dex Days 13-14 (4/13-4/14), dex Days 20-21 (4/20-4/21). VCR scheduled outpatient for 4/13 and 4/20.  - IT chemotherapy on 4/5 (no blasts identified). Additional Cycle 1 LP scheduled for outpatient with IT appointments on 4/14 and 4/20.   - Discharged on valacyclovir for prophylaxis (ANC > 0.5 at time of discharge)    Highlighted Medication Changes (with rationale, if applicable):  - Continue dasatinib 140mg  PO once daily  - Continue valacyclovir 500mg  PO once daily for HSV prophylaxis    Appointments and monitoring:  Future Appointments   Date Time Provider Department Center   11/08/2021  1:15 PM ADULT ONC LAB [facility]   11/08/2021  2:00 PM ONCHEM LEUKEMIA PHARMACIST [id number]   11/08/2021  3:00 PM Sean Marrian Salvage, AGNP [id number]   11/11/2021  9:00 AM ONCINF HMOB CHAIR 11 [facility]   11/12/2021 10:30 AM ONCINF CHAIR 07 [id number]   11/12/2021  1:30 PM UNCW FLUORO RM 9 IFLUOUW    11/18/2021  7:30 AM ONCINF CHAIR 17 [id number]   11/18/2021 11:30 AM Megan Raeanne Barry, PA [id number]   11/25/2021  8:45 AM ONCDEV CHAIR 54 [id number]   11/29/2021 11:30 AM ADULT ONC LAB [facility]   11/29/2021 12:30 PM Megan Raeanne Barry, PA [id number]   11/29/2021  1:30 PM ONCINF CHAIR 15 [id number]   12/02/2021  9:45 AM ADULT ONC LAB [facility]   12/02/2021 10:30 AM [name], MD [id number]   12/02/2021 12:00 PM ONCINF CHAIR 22 [id number]   12/06/2021  8:15 AM ONCDEV CHAIR 51 [id number]     Medication Access:  - dasatinib $0 via copay card - filled at [facility] at time of discharge    Outpatient Pharmacy Follow-Up:   [ ]  if ANC < 0.5 would plan to start fluconazole 200mg  PO once daily and levofloxacin 500mg  PO once daily  [ ]  dasatinib monitoring  [ ]  outpatient treatment plan signed (built for outpatient GRAAPH at time of discharge & not signed at time of note writing)    Ardyth Harps. Azucena Kuba, PharmD, BCOP  Hematology/Oncology Clinical Pharmacy Specialist

## 2021-11-05 NOTE — Unmapped (Signed)
Physician Discharge Summary [facility]  4 [facility]C [facility]  [address]  [place] [address]  Dept: [phone]  Loc: [phone]     Identifying Information:   Patricia [name]  04-20-1976  [id number]    Primary Care Physician: No PCP Per Patient     Referring Physician: [name]     Code Status: Full Code    Admit Date: 10/27/2021    Discharge Date: 11/05/2021     Discharge To: Home    Discharge Service: [facility] - Hematology APP Floor Team (MEDQ)     Discharge Attending Physician: Avie Arenas, MD    Discharge Diagnoses:  Active Problems:    Leukocytosis    B-cell acute lymphoblastic leukemia (ALL) (CMS-HCC)  Resolved Problems:    * No resolved hospital problems. *      Outpatient Provider Follow Up Issues:   Supportive Care Recommendations:  We recommend based on the patient???s underlying diagnosis and treatment history the following supportive care:    1. Antimicrobial prophylaxis:  Valtrex ppx; ANC 0.6, will need to monitor for need for additional ppx    2. Blood product support:  Leukoreduced blood products are required.  Irradiated blood products are preferred, but in case of urgent transfusion needs non-irradiated blood products may be used:     -  RBC transfusion threshold: transfuse 1 units for Hgb < 8 g/dL.  -  Platelet transfusion threshold: transfuse 1 unit of platelets for platelet count < 10, or for bleeding or need for invasive procedure.    Based on the patient's disease status and intensity of therapy, complete blood count with differential should be evaluated 1 times per week and used to guide transfusion support    3. Hematopoietic growth factor support: none    Hospital Course:   Patricia [name] is a 46 y.o. female with no significant PMHx who was transferred from OSH due concern for Acute Leukemia. 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 D1=10/30/21 GRAAPH-2005. LP with IT MTX (deferring triple therapy in light on steroid shortage), negative for disease. She discharged on D7. She will receive D13 Vincristine on 4/13 and 2nd IT chemotherapy (Ara-c) on 4/14. She will then receive D20 VCR and 3rd IT chemotherapy on 4/20 (MTX). She will take Dexamethasone D8 on 4/8 and Dexamethasone on D13-D14 on 4/13-4/14 and Dexamethasone D20-D21 on 4/20-4/21 to align with VCR days. These days are off normal protocol but this was required given scheduling limitations. She will continue Dasatanib 140 mg daily (dispensed on discharge). She will continue Valtrex ppx. ANC 0.6 on discharge, will need monitoring in case further ppx start is indicated. She will return on 4/10 to see [name], NP and CPP for TOC. She will have labs on 4/10, 4/13 and then weekly beginning 4/20. She has a BMBx on 5/1 and follow up with Dr. Senaida Ores on 5/4. We have requested a port placement week of 5/8. Her detailed hospital course is outlined below.    Ph (+) B-Cell ALL: Patient with 1 week history of night sweats, intermittent low grade fevers, easy bruising, fatigue, HA and visual floaters who presented to OSH. Patient found to have WBC 54.8 with concern for peripheral blasts. Hgb 10.5, Plts 14. LDH 1646. Started on Hydrea and transitioned to prednisone pre-phase once ALL was confirmed. 3/31 BMBx with B-ALL, BCR-ABL positive. Heb B immunized, Hep C/HIV NR. G6PD 6.5. ECHO EF 60-65%. Patient declined fertility preservation. PICC line placed 10/28/21 and removed 4/7 prior to discharge given superficial  thrombus and patient discomfort. D1=10/30/21 GRAAPH-2005. LP with IT MTX (deferring triple therapy in light on steroid shortage), negative for disease. She discharged on D7. She will receive D13 Vincristine on 4/13 and 2nd IT chemotherapy (Ara-c) on 4/14. She will then receive D20 VCR and 3rd IT chemotherapy on 4/20 (MTX). She will take Dexamethasone D8 on 4/8 and Dexamethasone on D13-D14 on 4/13-4/14 and Dexamethasone D20-D21 on 4/20-4/21 to align with VCR days. These days are off normal protocol but this was required given scheduling limitations. She will continue Dasatanib 140 mg daily (dispensed on discharge). She will continue Valtrex ppx. ANC 0.6 on discharge, will need monitoring in case further ppx start is indicated. She will return on 4/10 to see [name], NP and CPP for TOC. She will have labs on 4/10, 4/13 and then weekly beginning 4/20. She has a BMBx on 5/1 and follow up with Dr. Senaida Ores on 5/4. We have requested a port placement week of 5/8.      Visual Floaters - HA: Patient reported visual floaters on admission. 4/6 Reports floaters persist to right eye, denies any occular pain or headaches. Optho assessed and noted bilateral retinal hemorrhages, no specific recommendations other than to transfuse per oncology parameters.      Superficial Thrombus: Reported bruising and discomfort to PICC site overnight 4/6. PVL with superficial thrombus in basilic vein, no e/o DVT. PICC line was removed given discomfort and superficial thrombus. We have requested a port placement week of 5/8.     Acute on Chronic Right Shoulder/Back/Neck Pain: Endorses chronic neck, back and shoulder pain since 2020. Endorses having been on multiple medication/pain regimens with little benefit. Patient states her right shoulder became acutely painful in early March which is different in character from baseline discomfort. States she had a steroid injection in early 09/2021 with some benefit but has not lasted. States pain is only relieved by steroids and ice packs. Pain refractory to Tramadol, Tizanidine, Methocarbamol, and Ibuprofen. OSH MRI without acute pathology. Palliative followed while admitted. She will continue Oxycodone 5-10 mg PRN in addition to lidocaine patches and Voltaren gel. She has follow up with Palliative Care on 5/8 (Dr. Ike Bene) and was instructed to follow up with local spine center (had an appointment prior to this admission). She was also given a referral for local ambulatory PT (she was instructed to contact any local agency of her choice). PT recommended shower chair with back, patient reported she had this at home already.     Grade 1 Transaminitis, Improving: Likely secondary to underlying possible leukemic process vs medication induced. Patient states she has been on a large medication regimen for treating her shoulder pain since early March 2023. RUQ [place] at OSH with slightly increased echogenicity suggesting mild steatosis. No focal lesions. LFT's improving on discharge.      Recent Dog Bite: Patient with recent dog bite and treated with Augmentin 875-125 mg (3/23-4/1) with plan for 10 day course. Patient also received a dose of IV Unasyn at OSH.      Chronic HTN: Home regimen of Losartan 50 mg. Held on admission given TLS risk. She was instructed to stop on discharge given normal BP's during entire admission.      Constipation: Reports BM every 2-3 days at baseline. Given hx, opioid use for pain control, and initiation of vincristine, bowel regimen started. She will continue Miralax and Senna PRN and was provided titration instructions on discharge.      Anxiety: Home regimen of Lexapro 10 mg  every day. Ativan 0.5 mg PO TID PRN started 3/30 per palliative care recommendations and will continue both on discharge.      Seasonal Allergies: No home regimen.      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       Procedures:  LP with IT chemotherapy on 11/03/21.   PICC insertion on 10/28/21 and removed on 11/05/21.  Bone Marrow Biopsy on 10/29/21.  ______________________________________________________________________  Discharge Medications:     Your Medication List      STOP taking these medications    ibuprofen 200 MG tablet  Commonly known as: ADVIL,MOTRIN     losartan 50 MG tablet  Commonly known as: COZAAR        START taking these medications    butalbital-acetaminophen-caffeine 50-325-40 mg per tablet  Commonly known as: ESGIC  Take 1 tablet by mouth every six (6) hours as needed for headache for up to 14 days.     dexAMETHasone 4 MG tablet  Commonly known as: DECADRON  Take 10 tablets (40 mg total) by mouth daily on 11/06/21, 11/11/21, 11/12/21, 11/18/21, 11/19/21  Start taking on: November 06, 2021     dexAMETHasone 4 MG tablet  Commonly known as: DECADRON  Take 10 tablets (40 mg total) by mouth daily for 2 days. TAKE ON 4/13 AND 4/14  Start taking on: November 11, 2021     dexAMETHasone 4 MG tablet  Commonly known as: DECADRON  Take 10 tablets (40 mg total) by mouth daily for 2 days. TAKE ON 4/20 & 4/21  Start taking on: November 18, 2021     LIDOCAINE PAIN RELIEF 4 % patch  Generic drug: lidocaine  Place 1 patch on the skin daily. Apply to affected area for 12 hours only each day (then remove patch)     LORazepam 0.5 MG tablet  Commonly known as: ATIVAN  Take 1 tablet (0.5 mg total) by mouth every eight (8) hours as needed for anxiety for up to 14 days.     oxyCODONE 5 MG immediate release tablet  Commonly known as: ROXICODONE  Take 1-2 tablets (5-10 mg total) by mouth every four (4) hours as needed for up to 14 days. Take 1 tablet (5mg ) for moderate pain and 2 tablets (10mg ) for severe pain.     polyethylene glycol 17 gram/dose powder  Commonly known as: GLYCOLAX  Take 1 packet (17 g) by mouth two (2) times a day as needed. Mix with 4-8 oz liquid and drink.     prochlorperazine 10 MG tablet  Commonly known as: COMPAZINE  Take 1 tablet (10 mg total) by mouth every six (6) hours as needed for up to 14 days.     SENNA 8.6 mg tablet  Generic drug: senna  Take 1 tablet by mouth nightly as needed for constipation.     SpryceL 70 MG tablet  Generic drug: dasatinib  Take 2 tablets (140 mg total) by mouth daily.     valACYclovir 500 MG tablet  Commonly known as: VALTREX  Take 1 tablet (500 mg total) by mouth daily.        CONTINUE taking these medications    escitalopram oxalate 10 MG tablet  Commonly known as: LEXAPRO  Take 1 tablet (10 mg total) by mouth daily. Allergies:  Erythromycin, Sulfa (sulfonamide antibiotics), and Azithromycin  ______________________________________________________________________  Pending Test Results (if blank, then none):  Pending Labs  Order Current Status    Cytogenetics AP Order In process    DNA Extract and Hold In process    RNA Extract and Hold In process    Cytogenetics AP Order Preliminary result          Most Recent Labs:  All lab results last 24 hours -   Recent Results (from the past 24 hour(s))   Type and Screen subsequent    Collection Time: 11/05/21 12:36 AM   Result Value Ref Range    ABO Grouping O POS     Antibody Screen NEG    Basic Metabolic Panel    Collection Time: 11/05/21 12:36 AM   Result Value Ref Range    Sodium 137 135 - 145 mmol/L    Potassium 4.0 3.4 - 4.8 mmol/L    Chloride 102 98 - 107 mmol/L    CO2 28.0 20.0 - 31.0 mmol/L    Anion Gap 7 5 - 14 mmol/L    BUN 23 9 - 23 mg/dL    Creatinine 1.61 0.96 - 0.80 mg/dL    BUN/Creatinine Ratio 33     eGFR CKD-EPI (2021) Female >90 >=60 mL/min/1.62m2    Glucose 159 70 - 179 mg/dL    Calcium 8.8 8.7 - 04.5 mg/dL   Hepatic Function Panel    Collection Time: 11/05/21 12:36 AM   Result Value Ref Range    Albumin 3.1 (L) 3.4 - 5.0 g/dL    Total Protein 6.2 5.7 - 8.2 g/dL    Total Bilirubin 0.7 0.3 - 1.2 mg/dL    Bilirubin, Direct 4.09 0.00 - 0.30 mg/dL    AST 30 <=81 U/L    ALT 68 (H) 10 - 49 U/L    Alkaline Phosphatase 130 (H) 46 - 116 U/L   CBC w/ Differential    Collection Time: 11/05/21 12:36 AM   Result Value Ref Range    WBC 1.7 (L) 3.6 - 11.2 10*9/L    RBC 2.23 (L) 3.95 - 5.13 10*12/L    HGB 6.8 (L) 11.3 - 14.9 g/dL    HCT 19.1 (L) 47.8 - 44.0 %    MCV 85.0 77.6 - 95.7 fL    MCH 30.6 25.9 - 32.4 pg    MCHC 35.9 32.0 - 36.0 g/dL    RDW 29.5 62.1 - 30.8 %    MPV 7.2 6.8 - 10.7 fL    Platelet 20 (L) 150 - 450 10*9/L    Neutrophils % 34.2 %    Lymphocytes % 64.3 %    Monocytes % 0.5 %    Eosinophils % 0.8 %    Basophils % 0.2 %    Absolute Neutrophils 0.6 (L) 1.8 - 7.8 10*9/L    Absolute Lymphocytes 1.1 1.1 - 3.6 10*9/L    Absolute Monocytes 0.0 (L) 0.3 - 0.8 10*9/L    Absolute Eosinophils 0.0 0.0 - 0.5 10*9/L    Absolute Basophils 0.0 0.0 - 0.1 10*9/L   Morphology Review    Collection Time: 11/05/21 12:36 AM   Result Value Ref Range    Smear Review Comments See Comment (A) Undefined    Hypersegmented Neutrophils Present (A) Not Present    Toxic Granulation Present (A) Not Present   Prepare RBC    Collection Time: 11/05/21  3:10 AM   Result Value Ref Range    Crossmatch Compatible     Unit Blood Type O Pos     ISBT Number 5100     Unit # [id number]  Status Carrie Mew Expiration [id number]     Product ID Red Blood Cells     PRODUCT CODE [id number]    Prepare Platelet Pheresis    Collection Time: 11/05/21 11:01 AM   Result Value Ref Range    Unit Blood Type O Pos     ISBT Number 5100     Unit # [id number]     Status Issued     Product ID Platelets     PRODUCT CODE [id number]    Prepare RBC    Collection Time: 11/05/21 12:49 PM   Result Value Ref Range    Crossmatch Compatible     Unit Blood Type O Pos     ISBT Number 5100     Unit # [id number]     Status Issued     Spec Expiration [id number]     Product ID Red Blood Cells     PRODUCT CODE [id number]    Platelet Count    Collection Time: 11/05/21  1:22 PM   Result Value Ref Range    Platelet 26 (L) 150 - 450 10*9/L       Relevant Studies/Radiology (if blank, then none):  ECG 12 Lead    Result Date: 10/29/2021  NORMAL SINUS RHYTHM NORMAL ECG NO PREVIOUS ECGS AVAILABLE Confirmed by Pollyann Kennedy ([id number]) on 10/29/2021 9:21:59 PM    XR Humerus Left    Result Date: 11/05/2021  EXAM: XR HUMERUS LEFT DATE: 11/05/2021 7:57 AM ACCESSION: [id number] UN DICTATED: 11/05/2021 8:13 AM INTERPRETATION LOCATION: [facility] CLINICAL INDICATION: 46 years old Female with PICC insertion site complication    COMPARISON: None. TECHNIQUE: AP and lateral views of the left humerus. FINDINGS: No acute fractures. No malalignment. Intact left upper extremity catheter with tip coursing out of field of view. No focal soft tissue swelling.     Intact left upper extremity catheter with tip coursing out of field of view.     Echocardiogram W Colorflow Spectral Doppler With Contrast    Result Date: 10/28/2021  Patient Info Name:     MEDEA DEINES Privette Age:     45 years DOB:     1975-10-20 Gender:     Female MRN:     [id number] Accession #:     [id number] UN Ht:     170 cm Wt:     82 kg BSA:     1.98 m2 BP:     148 /     100 mmHg Technical Quality:     Fair Exam Date:     10/28/2021 8:59 AM Site Location:     [facility] Exam Location:     [facility] Admit Date:     10/27/2021 Exam Type:     ECHOCARDIOGRAM W COLORFLOW SPECTRAL DOPPLER W CONTRAST Study Info Indications      - evaluation prior to chemotherapy Complete two-dimensional, color flow and Doppler transthoracic echocardiogram is performed with contrast to opacify the left ventricle and to improve the delineation of the left ventricle endocardial borders. Staff Referring Physician:     Kathrynn Running ; Reading Fellow:     Kathrene Alu MD Sonographer:     Ezequiel Kayser Ordering Physician:     Mariana Kaufman Account #:     [id number] Ultrasound Enhancing Agent/Agitated Saline ------------------------------ UEA/Ag. Saline:     Definity Amount:     --- ml Summary   1. The left ventricle is normal in size with mildly increased wall thickness.   2. The left ventricular  systolic function is normal, LVEF is visually estimated at 60-65%.   3. The right ventricle is normal in size, with normal systolic function.   4. An ultrasound enhancing agent was used to improve the visualization of the left ventricular cavity and endocardial borders. Left Ventricle   The left ventricle is normal in size with mildly increased wall thickness.   The left ventricular systolic function is normal, LVEF is visually estimated at 60-65%.   There is normal left ventricular diastolic function. Right Ventricle   The right ventricle is normal in size, with normal systolic function. Left Atrium   The left atrium is normal in size. Right Atrium   The right atrium is normal  in size. Aortic Valve   The aortic valve is trileaflet with normal appearing leaflets with normal excursion.   There is no significant aortic regurgitation.   There is no evidence of a significant transvalvular gradient. Pulmonic Valve   The pulmonic valve is normal.   There is no significant pulmonic regurgitation.   There is no evidence of a significant transvalvular gradient. Mitral Valve   The mitral valve leaflets are normal with normal leaflet mobility.   There is no significant mitral valve regurgitation. Tricuspid Valve   The tricuspid valve leaflets are normal, with normal leaflet mobility.   There is no significant tricuspid regurgitation.   The pulmonary systolic pressure cannot be estimated due to insufficient TR signal. Other Findings   Rhythm: Tachycardia. Pericardium/Pleural   There is no pericardial effusion. Inferior Vena Cava   The IVC is not well visualized precluding the ability to accurate assess right atrial pressure. Aorta   The aorta is normal in size in the visualized segments. Pulmonic Valve ---------------------------------------------------------------------- Name                                 Value        Normal ---------------------------------------------------------------------- PV Doppler ---------------------------------------------------------------------- PV Peak Velocity                   1.0 m/s Mitral Valve ---------------------------------------------------------------------- Name                                 Value        Normal ---------------------------------------------------------------------- MV Diastolic Function ---------------------------------------------------------------------- MV E Peak Velocity                 95 cm/s               MV A Peak Velocity                 98 cm/s               MV E/A 1.0               MV Annular TDI ---------------------------------------------------------------------- MV Septal e' Velocity            11.1 cm/s         >=8.0 MV Lateral e' Velocity           16.6 cm/s        >=10.0 MV e' Average                         13.9               MV  E/e' (Average)                      7.1 Aorta ---------------------------------------------------------------------- Name                                 Value        Normal ---------------------------------------------------------------------- Ascending Aorta ---------------------------------------------------------------------- Ao Root Diameter (2D)               3.0 cm               Ao Root Diam Index (2D)         15.1 cm/m2 Aortic Valve ---------------------------------------------------------------------- Name                                 Value        Normal ---------------------------------------------------------------------- AV Doppler ---------------------------------------------------------------------- AV Peak Velocity                   1.3 m/s Ventricles ---------------------------------------------------------------------- Name                                 Value        Normal ---------------------------------------------------------------------- LV Dimensions 2D/MM ---------------------------------------------------------------------- IVS Diastolic Thickness (2D)        1.1 cm       0.6-0.9 LVID Diastole (2D)                  4.3 cm       3.8-5.2 LVPW Diastolic Thickness (2D)                                1.3 cm       0.6-0.9 LVID Systole (2D)                   2.8 cm       2.2-3.5 RV Dimensions 2D/MM ---------------------------------------------------------------------- TAPSE                               2.3 cm         >=1.7 Atria ---------------------------------------------------------------------- Name                                 Value        Normal ---------------------------------------------------------------------- LA Dimensions ---------------------------------------------------------------------- LA Dimension (2D)                   3.7 cm       2.7-3.8 Report Signatures Resident Durward Parcel  MD on 10/28/2021 02:09 PM    PVL Venous Duplex Upper Extremity Left    Result Date: 11/05/2021   Peripheral Vascular Lab     [address]   [place], [place] [address]  PVL VENOUS DUPLEX UPPER EXTREMITY LEFT Patient Demographics Pt. Name: KATALEYAH CARDUCCI Cardell  Location: PVL Inpatient Lab MRN:      [id number] Sex:      F DOB:      Mar 22, 1976    Age:      15 years  Study Information Authorizing         [phone] AMBER DENISE  Performed Time       11/05/2021 8:53:01 Provider Name       [facility]                                      AM Ordering Physician  AMBER Alessandra Grout    Patient Location     [facility] Accession Number    [id number] UN         Technologist         Gwen Her Diagnosis:                                Dispensing optician Ordered Reason For Exam: PICC site complication Other Indication: Pain, Swelling near PICC line site Risk Factors: Chemotherapy and Cancer (Acute Leukemia).  Examination Protocol The internal jugular, brachiocephalic, subclavian, and axillary, brachial, basilic and cephalic veins are routinely assessed on the requested limb. Spectral and Color Doppler data is the primary method for evaluating the brachiocephalic and subclavian veins. Venous compression is used to evaluate the internal jugular, axillary, and upper arm veins. If a unilateral exam is requested, a contralateral subclavian vein Doppler signal is required for comparison.  Limitations:  Duplex Findings Right Subclavian vein Doppler signal appears within normal limits. Left No abnormality of venous architecture is observed in the central veins. All Doppler signals are appropriately pulsatile with ventilatory excursions. Color Doppler notes appropriate filling in all vessels. The basilic vein at at the confluence of basilic and brachial vein very proximal near PICC line. is partially compressible appearing spongy with compression, softly echogenic and dilated. Partial color filling detected in the basilic vein. All other venous findings in the upper extremity are within normal limits.  Summary of Findings Right Normal subclavian vein Doppler signal suggests proximal central venous patency. Left No evidence of obstruction was seen in the central veins. Evidence of acute obstruction is visualized in the basilic vein at at the confluence of basilic and brachial vein very proximal near PICC line. All other venous findings in the upper extremity are within normal limits.  Final Interpretation Left No evidence of DVT detected in the central veins. Evidence of acute superficial venous obstruction in the basilic vein.  Electronically signed by [id number] Jodell Cipro MD on 11/05/2021 at 10:09:32 AM.  Final     ______________________________________________________________________    Activity Instructions     Activity as tolerated      It is important to remember that you blood counts may continue to drop while you are at home. This will cause you to feel more tired and place you at an increased risk for bleeding. Please avoid extraneous activities.          Diet Instructions     Discharge diet (specify)      Discharge Nutrition Therapy: Regular    While your blood counts are low, it is important to follow these Neutropenic precautions:  Wash your hands frequently with soap and water  Take your temperature when you have chills or are not feeling well  Do not get cuts, punctures or scratches on your skin  Use a soft toothbrush  Prevent constipation  Drink lots of fluids (water, juice, etc.) to prevent dehydration  Avoid people who have colds or the flu  Do not visit crowded areas such as malls or movie theaters  Avoid anyone who has received a live vaccination (shot) within the last three weeks  Maintain a well-balanced diet and eat healthy foods, but avoid raw or uncooked foods, including raw vegetables and fruits  Speak with your doctor before having any dental work done   Limit exposure to pet feces (e.g., litter box)   Do only as much activity as you can tolerate               Other Instructions     Call MD for:  difficulty breathing, headache or visual disturbances      Call MD for:  extreme fatigue      Call MD for:  hives      Call MD for:  persistent dizziness or light-headedness      Call MD for:  persistent nausea or vomiting      Call MD for:  redness, tenderness, or signs of infection (pain, swelling, redness, odor or green/yellow discharge around incision site)      Call MD for:  severe uncontrolled pain      Call MD for:  vaginal bleeding saturating more than 1 pad per hour.      Call MD for: Temperature > 38.5 Celsius ( > 101.3 Fahrenheit)      Discharge instructions      Diagnosis: [place] Positive Acute Lymphoblastic Leukemia (Ph + B-ALL)    Course: You have been diagnosed with acute lymphoblastic leukemia. You started induction chemotherapy and are discharging to continue this as an outpatient. Please continue your medications and outlined below and your follow up is also outlined below.     New/Important Medications:  - Dasatanib 140 mg by mouth daily- You will begin taking this on 4/8 at whatever time is convenient for you  - Please take Dexamethasone 40 mg by mouth on 4/8, 4/13, 4/14, 4/20 and 4/21  - Valtrex 500 mg daily- helps prevent shingles   - Oxycodone 5-10 mg every 4 hours as needed for pain  - Lidocaine patch to any area of pain as needed  - Voltaren gel to sites of pain as needed  - Miralax and Senna as needed for constipation (we have provided a bowel regimen schedule below)  - Ativan as needed for any anxiety  - Esgic as needed for headaches  - Continue your Lexapro    Medications to STOP taking:  - Losartan, your blood pressures have been normal off this medication. Please also do not take Ibuprofen as your counts are low and this increases your risk of bleeding.     Home Health Needs:    - None, we have provided you a referral for any local physical therapy of your choice (please call them to set up services).    Follow up Appointments:   - Visit with [name], NP and Leukemia Pharmacist and labs on 4/10 at [facility]  - Day 13 Vincristine and labs on 4/13 in [place]   - 2nd IT chemotherapy (Ara-c) on 4/14 at [facility]  - Day 20 Vincristine, labs and 3rd IT chemotherapy on 4/20 at [facility] on 4/27 at [facility]  - Bone Marrow Biopsy on 5/1  - Follow Up with Dr. Senaida Ores on 5/4 and discuss marrow results and next steps  - 5/8 Follow Up with Dr.  Gaffney (Palliative Care)  - We have requested a port placement for week of 5/8 and are awaiting scheduling       Management of Constipation:    What is a bowel routine?   A bowel routine is a schedule for taking medicines regularly to prevent or relieve constipation (trouble moving or emptying your bowels). These types of medicines are also called laxatives.     Why do I need a bowel routine?   Having a bowel routine will help keep your movements regular. Being constipated can be uncomfortable and make you feel unwell. If you don't treat your constipation, it can lead to serious medical problems.     Constipation can be a side effect of pain medicine (also called opioid analgesics, like oxycodone or morphine). Things that can make constipation worse include:   chemotherapy   not drinking enough fluids   some anti-nausea medicines (such as Zofran)   being less active  eating less than normal     Preventing constipation is easier than treating it. Ways to prevent it include:  Drink 8 cups of liquid each day, unless advised otherwise by your health care team. Your body needs liquid to help keep the stool soft.   Do some light exercise, such as walking 15 to 20 minutes once or twice a day.   Eat fresh fruit and vegetables (that have been well cleaned)   Examples: raisins, prunes, figs, bananas, apples, and applesauce AND Pinto beans, legumes, salads, and raw vegetables  Starting a bowel routine. The goal is to have a bowel movement every 2 to 3 days, or as close to your normal pattern as possible.    When do I start my bowel routine?  Start your bowel routine on the same day you are starting a pain medicine (such as oxycodone) OR you have not had a bowel movement in >3 days.      What is my bowel routine?   Below are steps for a bowel routine to make it easy for you to change how much bowel routine medicine you need.     Where do I start?   Start at step 1. If you are already following a bowel routine with a different laxative, please ask your nurse or pharmacist which step you should start at.   If you are already constipated, your nurse or provider may suggest you begin at a different step.     When do I move to the next step?   If you don't have a bowel movement after 2 days at step 1, go to step 2.  If you don't have a bowel movement 1 day after doing step 2, go to the next step.  If you have a bowel movement that is comfortable to pass every 2 to 3 days when you are at a certain step, stay at the same step.   If you are having more than 2 bowel movements per day, go back a step. If at step 1, then stop.     Bowel routine steps:     Step 1 Use Miralax    Take 1 capful daily     Step 2  Increase the Miralax   Take 1 capful twice daily      Step 3  Increase the Miralax again and add a laxative   Take 1 capful Three times per day  Bed time: Take 2 Senokot pills (or 10 millilitres of liquid)     Step 4  Increase Senokot   Morning: Take 2 Senokot pill (or 10 millilitres of liquid)  Bedtime: Take 2 Senokot pill (or 10 millilitres of liquid)    Step 5  Talk to your doctor or nurse     If you are still constipated after following these 4 steps, you may need a different kind of medicine or need to make other changes. Don't take more than 8 Senokot pills a day.              When to Call Your [facility] Cancer Care Team:   Monday- Friday from 8:00 am - 5:00 pm   On Lovenia Kim and Holidays   Call [phone]     Call [phone]  Or Toll free [phone]    Ask the operator to page the   Ask to speak to the Triage Nurse  Oncology Fellow on Call     **Spanish-speaking patients should call [phone]     RED ZONE:  Take action now!  You need to be seen right away. Call 911 or go to your nearest hospital for help.   - Symptoms are at a severe level of discomfort    - Bleeding that will not stop  - Chest Pain    - Hard to breathe    - Fall or passing out    - New Seizure    - Thoughts of hurting yourself or others     YELLOW ZONE: Take action today  This is NOT an all-inclusive list. Pleae call with any new or worsening symptoms.   Call your doctor, nurse or other healthcare provider at [phone]  You can be seen by a provider the same day through our Same Day Acute Care for Patients with Cancer program.   - Symptoms are new or worsening; You are not within your goal range for:    - Pain          - Swelling (leg, arm, abdomen, face, neck)    - Shortness of breath       - Skin rash or skin changes    - Bleeding (nose, urine, stool, wound)    - Wound issues (redness, drainage, re-opened)    - Feeling sick to your stomach and throwing up    - Confusion    - Mouth sores/pain in your mouth or throat    - Vision changes   - Hard stool or very loose stools (increase in ostomy   - Fever >100.4 F, chills   Output)        - Worsening cough with mucus that is green, yellow or bloody   - No urine for 12 hours      - Pain or burning when going to the bathroom    - Feeding tube or other catheter/tube issue    - Home infusion pump issue - call [phone]   - Redness or pain at previous IV or port/catheter site    - Depressed or anxiety     GREEN ZONE: You are in control   Your symptoms are under control. Continue to take your medicine as ordered. Keep all visits to the doctor.   - No increase or worsening symptoms   - Able to take your medicine   - Able to drink and eat     For your safety and best care, please DO NOT use MyChart messages to report red or yellow symptoms.   MyChart messages are only checked during weekday normal business hours and you  should receive a   Response within 2 business days.   Please use MyChart only for the follows:   - Non-urgent medication refills, scheduling requests or general questions.           Patient Education:     Your blood counts may continue to drop, so it is good to adhere to the following precautions:    - Wash your hands routinely with soap and water  - Take your temperature when you have chills or are not feeling well  - Use a soft toothbrush  - Avoid constipation or straining with bowel movements. This may mean you occasionally need to take over-the-counter stool softeners or laxatives.   - Avoid people who have colds or the flu, or are not feeling well.  - Wear a mask when visiting crowded places.  - Avoid anyone who has received a live vaccination (shot) within the last three weeks  - Maintain a well-balanced diet and eat healthy foods  - Speak with your doctor before having any dental work done  - Limit exposure to pet feces (e.g., litter box)  - Do only as much activity as you can tolerate    Other instructions:  - Don't use dental floss if your platelet count is below 50,000. Your doctor or nurse should tell you if this is the case.  - Use any mouthwashes given to you as directed.  - If you can't tolerate regular brushing, use an oral swab (bristle-less) toothbrush, or use salt and baking soda to clean your mouth. Mix 1 teaspoon of salt and 1 teaspoon of baking soda into an 8-ounce glass of warm water. Swish and spit.  - Watch your mouth and tongue for white patches. This is a sign of fungal infection, a common side effect of chemotherapy. Be sure to tell your doctor about these patches. Medication/mouthwashes can be prescribed to help you fight the fungal infection.      COVID-19 is a new challenge, but [facility] and the [facility] is dedicated to providing you and your loved ones with the best possible cancer care and support in the safest way possible during this time. We made two videos about the ways we are working to keep you safe, such as offering the option to visit your care team over the phone or through a video, as well as support services offered for our patients and their caregivers. If you have any questions about your cancer care, please call your care team.  ??  Video #1: Keeping [facility] Cancer Care patients safe during the COVID-19 crisis  http://go.eabjmlille.com  ??  Video #2: Support for cancer patients and their caregivers during the COVID-19 pandemic  http://go.SecureGap.uy    Physician communication order      Routine use of G-CSF is not recommended in cycle 1. Consider use based on patient-specific factors.          Follow Up instructions and Outpatient Referrals     Amb Referral to Palliative Care      Ambulatory referral to Physical Therapy      Suggest Treatment: Evaluation with suggestions for treatment    Call MD for:  difficulty breathing, headache or visual disturbances      Call MD for:  extreme fatigue      Call MD for:  hives      Call MD for:  persistent dizziness or light-headedness      Call MD for:  persistent nausea or vomiting  Call MD for:  redness, tenderness, or signs of infection (pain, swelling,   redness, odor or green/yellow discharge around incision site)      Call MD for:  severe uncontrolled pain      Call MD for:  vaginal bleeding saturating more than 1 pad per hour.      Call MD for: Temperature > 38.5 Celsius ( > 101.3 Fahrenheit)      Discharge instructions      Bilirubin, total      Release to patient: Immediate    CSF protein      Release to patient: Immediate    Glucose, CSF      Release to patient: Immediate    Hempath Leukemia Flowcytometry, CSF      Release to patient: Immediate    Bilirubin, total      Release to patient: Immediate    CBC and differential      Release to patient: Immediate    CSF protein      Release to patient: Immediate    Glucose, CSF      Release to patient: Immediate    Hempath Leukemia Flowcytometry, CSF      Release to patient: Immediate        Appointments which have been scheduled for you    Nov 08, 2021  1:15 PM  (Arrive by 12:45 PM)  LAB ONLY [place] with ADULT ONC LAB  [facility] ADULT ONCOLOGY LAB DRAW STATION [facility] REGION) [address]  [place] [address]  [phone]      Nov 08, 2021  2:00 PM  (Arrive by 1:30 PM)  RETURN ACTIVE [place] with [facility] LEUKEMIA PHARMACIST  [place] HEMATOLOGY ONCOLOGY 2ND FLR CANCER HOSP [facility] REGION) [address]  [place]  [place] [address]  [phone]      Nov 08, 2021  3:00 PM  (Arrive by 2:30 PM)  NEW  HOSPITAL F/U with Lenon Ahmadi, AGNP  [facility] HEMATOLOGY ONCOLOGY 2ND FLR CANCER HOSP [facility] REGION) [address]  [place] [address]  [phone]      Nov 11, 2021  9:00 AM  (Arrive by 8:45 AM)  BLOOD TRANSFUSION - 2 UNITS with ONCINF HMOB CHAIR 11  [place] [facility] INFUSION [place] [facility] REGION) [address]  [place] [address]  [phone]      Nov 12, 2021 10:30 AM  (Arrive by 10:00 AM)  LEVEL 180 with [employer] CHAIR 07  [place] ONCOLOGY INFUSION [facility] REGION) [address]  [place] [address]  [phone]      Nov 12, 2021  1:30 PM  (Arrive by 1:00 PM)  FL LUMBAR PUNCTURE WITH CHEMO with Onnie Boer RM 9  [place] [facility]) [address]  [place] [address]  [phone]      Nov 18, 2021  7:30 AM  (Arrive by 7:00 AM)  BLOOD TRANSFUSION - 2 UNITS with [employer] CHAIR 17  [place] ONCOLOGY INFUSION [place] [facility] REGION) [address]  [place] [address]  [phone]      Nov 18, 2021 11:30 AM  (Arrive by 11:00 AM)  LUMBAR PUNCTURE with Darel Hong, PA  [place] ONCOLOGY INFUSION [place] [facility] REGION) [address]  [place] [address]  [phone]      Nov 25, 2021  8:45 AM  (Arrive by 8:15 AM)  BLOOD TRANSFUSION - 2 UNITS with ONCDEV CHAIR 54  [place] ONCOLOGY INFUSION [place] [facility] REGION) [address]  [place] [address]  [phone]      Nov 29, 2021 11:30 AM  (Arrive by 11:00 AM)  NURSE LAB DRAW with ADULT ONC LAB  [facility] ADULT ONCOLOGY LAB DRAW STATION [place] [facility] REGION) [address]  [place] [address]  [phone]      Nov 29, 2021 12:30 PM  (Arrive by 12:00 PM)  BONE MARROW BIOPSY with Darel Hong, PA  [place] ONCOLOGY INFUSION [place] [facility] REGION) [address]  [place] [address]  [phone]      Nov 29, 2021  1:30 PM  (Arrive by 1:00 PM)  BLOOD TRANSFUSION - 2 UNITS with [employer] CHAIR 15  [place] ONCOLOGY INFUSION [place] [facility] REGION) [address]  [place] [address]  [phone]      Dec 02, 2021  9:45 AM  (Arrive by 9:15 AM)  LAB ONLY [place] with ADULT ONC LAB  [facility] ADULT ONCOLOGY LAB DRAW STATION [place] [facility] REGION) [address]  [place] [address]  [phone]      Dec 02, 2021 10:30 AM  (Arrive by 10:00 AM)  NEW HEM MALIG ONC  with [name], MD  [facility] HEMATOLOGY ONCOLOGY 2ND FLR CANCER HOSP [place] [facility] REGION) [address]  [place] [address]  [phone]      Dec 02, 2021 12:00 PM  (Arrive by 11:30 AM)  BLOOD TRANSFUSION - 2 UNITS with [employer] CHAIR 22  [place] ONCOLOGY INFUSION [place] [facility] REGION) [address]  [place] [address]  [phone]      Dec 06, 2021  8:15 AM  (Arrive by 7:45 AM)  BLOOD TRANSFUSION - 2 UNITS with ONCDEV CHAIR 51  [place] ONCOLOGY INFUSION [place] [facility] REGION) [address]  [place] [address]  [phone]      Dec 06, 2021 12:00 PM  (Arrive by 11:30 AM)  NEW PALLIATIVE CARE with Ortencia Kick, MD  [facility] ONCOLOGY INFUSION [place] [facility] REGION) [address]  [place] [address]  [phone]           ______________________________________________________________________  Discharge Day Services:  BP 120/89  - Pulse 84  - Temp 37.2 ??C (99 ??F) (Oral)  - Resp 18  - Ht 166.6 cm (5' 5.6)  - Wt 85.2 kg (187 lb 13.3 oz)  - SpO2 97%  - BMI 30.69 kg/m??   Pt seen on the day of discharge and determined appropriate for discharge.    Condition at Discharge: good    Length of Discharge: I spent greater than 30 mins in the discharge of this patient.    Binnie Kand, AGACNP-BC, AOCNP 11/05/2021  2:21 PM   Nurse Practitioner  Hematology/Oncology Department   [facility]   Group Pager: [phone]

## 2021-11-05 NOTE — Unmapped (Signed)
A&Ox4, VSS, afebrile, free from falls/injuries. Denies NVD but c/o neck pain oxy x1 PRN given w/ moderate relief. Scheduled medicines given. 1u pRBC given. VAT consult placed for new drainage and bruising noted at PICC insertion site. Falls precaution taken. No acute events this shift. Husband at bedside.  Vitals:    11/04/21 2050 11/05/21 0035 11/05/21 0321 11/05/21 0337   BP: 113/67 106/77 107/69 108/71   Pulse: 114 98 100 94   Resp: 18 16 16 18    Temp: 37.5 ??C (99.5 ??F) 37.1 ??C (98.8 ??F) 36.7 ??C (98.1 ??F) 37.1 ??C (98.8 ??F)   TempSrc: Oral Oral Oral Oral   SpO2: 99% 98% 98% 98%   Weight:       Height:               Problem: Adult Inpatient Plan of Care  Goal: Plan of Care Review  Outcome: Ongoing - Unchanged  Goal: Patient-Specific Goal (Individualized)  Outcome: Ongoing - Unchanged  Goal: Absence of Hospital-Acquired Illness or Injury  Outcome: Ongoing - Unchanged  Intervention: Identify and Manage Fall Risk  Recent Flowsheet Documentation  Taken 11/04/2021 1920 by Georgia Duff, RN  Safety Interventions:  ??? family at bedside  ??? lighting adjusted for tasks/safety  ??? low bed  ??? neutropenic precautions  ??? nonskid shoes/slippers when out of bed  Intervention: Prevent and Manage VTE (Venous Thromboembolism) Risk  Recent Flowsheet Documentation  Taken 11/04/2021 1920 by Georgia Duff, RN  Activity Management: up ad lib  Intervention: Prevent Infection  Recent Flowsheet Documentation  Taken 11/04/2021 1920 by Georgia Duff, RN  Infection Prevention: cohorting utilized  Goal: Optimal Comfort and Wellbeing  Outcome: Ongoing - Unchanged  Goal: Readiness for Transition of Care  Outcome: Ongoing - Unchanged  Goal: Rounds/Family Conference  Outcome: Ongoing - Unchanged

## 2021-11-06 MED ORDER — DEXAMETHASONE 4 MG TABLET
ORAL_TABLET | ORAL | 0 refills | 5 days | Status: CP
Start: 2021-11-06 — End: 2021-11-11
  Filled 2021-11-05: qty 50, 5d supply, fill #0

## 2021-11-08 ENCOUNTER — Emergency Department
Admit: 2021-11-08 | Discharge: 2021-11-09 | Disposition: A | Discharge status: Home / Self Care | Payer: PRIVATE HEALTH INSURANCE | Attending: Emergency Medicine

## 2021-11-08 ENCOUNTER — Ambulatory Visit
Admit: 2021-11-08 | Discharge: 2021-11-09 | Payer: PRIVATE HEALTH INSURANCE | Attending: Adult Health | Primary: Adult Health

## 2021-11-08 ENCOUNTER — Ambulatory Visit: Admit: 2021-11-08 | Discharge: 2021-11-09 | Payer: PRIVATE HEALTH INSURANCE

## 2021-11-08 ENCOUNTER — Ambulatory Visit
Admit: 2021-11-08 | Discharge: 2021-11-09 | Disposition: A | Discharge status: Home / Self Care | Payer: PRIVATE HEALTH INSURANCE | Attending: Emergency Medicine

## 2021-11-08 DIAGNOSIS — R109 Unspecified abdominal pain: Principal | ICD-10-CM

## 2021-11-08 DIAGNOSIS — R112 Nausea with vomiting, unspecified: Principal | ICD-10-CM

## 2021-11-08 LAB — CBC W/ AUTO DIFF
BASOPHILS ABSOLUTE COUNT: 0 10*9/L (ref 0.0–0.1)
BASOPHILS RELATIVE PERCENT: 0.2 %
EOSINOPHILS ABSOLUTE COUNT: 0 10*9/L (ref 0.0–0.5)
EOSINOPHILS RELATIVE PERCENT: 0.8 %
HEMATOCRIT: 27.7 % — ABNORMAL LOW (ref 34.0–44.0)
HEMOGLOBIN: 9.8 g/dL — ABNORMAL LOW (ref 11.3–14.9)
LYMPHOCYTES ABSOLUTE COUNT: 0.8 10*9/L — ABNORMAL LOW (ref 1.1–3.6)
LYMPHOCYTES RELATIVE PERCENT: 46.2 %
MEAN CORPUSCULAR HEMOGLOBIN CONC: 35.5 g/dL (ref 32.0–36.0)
MEAN CORPUSCULAR HEMOGLOBIN: 30.9 pg (ref 25.9–32.4)
MEAN CORPUSCULAR VOLUME: 87 fL (ref 77.6–95.7)
MEAN PLATELET VOLUME: 6.6 fL — ABNORMAL LOW (ref 6.8–10.7)
MONOCYTES ABSOLUTE COUNT: 0 10*9/L — ABNORMAL LOW (ref 0.3–0.8)
MONOCYTES RELATIVE PERCENT: 2.1 %
NEUTROPHILS ABSOLUTE COUNT: 0.8 10*9/L — ABNORMAL LOW (ref 1.8–7.8)
NEUTROPHILS RELATIVE PERCENT: 50.7 %
PLATELET COUNT: 27 10*9/L — ABNORMAL LOW (ref 150–450)
RED BLOOD CELL COUNT: 3.18 10*12/L — ABNORMAL LOW (ref 3.95–5.13)
RED CELL DISTRIBUTION WIDTH: 13.5 % (ref 12.2–15.2)
WBC ADJUSTED: 1.7 10*9/L — ABNORMAL LOW (ref 3.6–11.2)

## 2021-11-08 LAB — COMPREHENSIVE METABOLIC PANEL
ALBUMIN: 3.6 g/dL (ref 3.4–5.0)
ALKALINE PHOSPHATASE: 125 U/L — ABNORMAL HIGH (ref 46–116)
ALT (SGPT): 68 U/L — ABNORMAL HIGH (ref 10–49)
ANION GAP: 8 mmol/L (ref 5–14)
AST (SGOT): 23 U/L (ref <=34)
BILIRUBIN TOTAL: 0.8 mg/dL (ref 0.3–1.2)
BLOOD UREA NITROGEN: 21 mg/dL (ref 9–23)
BUN / CREAT RATIO: 34
CALCIUM: 8.7 mg/dL (ref 8.7–10.4)
CHLORIDE: 104 mmol/L (ref 98–107)
CO2: 27 mmol/L (ref 20.0–31.0)
CREATININE: 0.62 mg/dL
EGFR CKD-EPI (2021) FEMALE: >90 mL/min/{1.73_m2} (ref >=60)
GLUCOSE RANDOM: 102 mg/dL (ref 70–179)
POTASSIUM: 4 mmol/L (ref 3.4–4.8)
PROTEIN TOTAL: 6.8 g/dL (ref 5.7–8.2)
SODIUM: 139 mmol/L (ref 135–145)

## 2021-11-08 LAB — URINALYSIS WITH MICROSCOPY WITH CULTURE REFLEX
BILIRUBIN UA: NEGATIVE
BLOOD UA: NEGATIVE
GLUCOSE UA: NEGATIVE
KETONES UA: NEGATIVE
LEUKOCYTE ESTERASE UA: NEGATIVE
NITRITE UA: NEGATIVE
PH UA: 6.5 (ref 5.0–9.0)
RBC UA: <1 /HPF (ref <=4)
SPECIFIC GRAVITY UA: 1.014 (ref 1.003–1.030)
SQUAMOUS EPITHELIAL: 4 /HPF (ref 0–5)
UROBILINOGEN UA: <2
WBC UA: 2 /HPF (ref 0–5)

## 2021-11-08 LAB — LIPASE: LIPASE: 50 U/L (ref 12–53)

## 2021-11-08 LAB — PREGNANCY, URINE: PREGNANCY TEST URINE: NEGATIVE

## 2021-11-08 MED ADMIN — ondansetron (ZOFRAN-ODT) disintegrating tablet 4 mg: 4 mg | ORAL | @ 17:00:00 | Stop: 2021-11-08

## 2021-11-08 MED ADMIN — lactated ringers bolus 1,000 mL: 1000 mL | INTRAVENOUS | @ 21:00:00 | Stop: 2021-11-08

## 2021-11-08 MED ADMIN — promethazine (PHENERGAN) tablet 25 mg: 25 mg | ORAL | @ 21:00:00 | Stop: 2021-11-08

## 2021-11-08 MED ADMIN — MORPhine injection 4 mg: 4 mg | INTRAVENOUS | @ 22:00:00

## 2021-11-08 MED ADMIN — iohexoL (OMNIPAQUE) 350 mg iodine/mL solution 100 mL: 100 mL | INTRAVENOUS | Stop: 2021-11-08

## 2021-11-08 NOTE — Unmapped (Signed)
Hi,     Patricia [name] has contacted the Communication Center in regards to the following symptom:     Nausea and vomiting    Please contact Patricia [name] at [phone]       A page or telephone call has been made to the corresponding clinic.     Thank you,  Yolanda Bonine   [facility] Cancer Communication Center   [phone]

## 2021-11-08 NOTE — Unmapped (Signed)
Bed: 08-A  Expected date:   Expected time:   Means of arrival:   Comments:  Patricia [name]

## 2021-11-08 NOTE — Unmapped (Signed)
Pt here lab draw and having emesis and not feeling good

## 2021-11-08 NOTE — Unmapped (Signed)
Bed: 21-B  Expected date:   Expected time:   Means of arrival:   Comments:  Hold for [employer] in [place]

## 2021-11-08 NOTE — Unmapped (Unsigned)
Ms. Zaragoza is a 46 yo F with newly diagnosed Ph+ ALL who received GRAAPH-2005 induction (D1 = 10/30/2021) and tolerated well without complications or toxicity. She is not neutropenic and was discharged home in stable condition with outpatient follow up for remainder of her induction chemotherapy cycle.    Day 10 Induction    D1=10/30/21 GRAAPH-2005. LP with IT MTX (deferring triple therapy in light on steroid shortage), negative for disease. She discharged on D7.   She will receive D13 Vincristine on 4/13   2nd IT chemotherapy (Ara-c) on 4/14.   D20 VCR and 3rd IT chemotherapy on 4/20 (MTX).   She will take Dexamethasone D8 on 4/8 and Dexamethasone on D13-D14 on 4/13-4/14 and Dexamethasone D20-D21 on 4/20-4/21 to align with VCR days. These days are off normal protocol but this was required given scheduling limitations.

## 2021-11-08 NOTE — Unmapped (Addendum)
Hx of lukemia, has not had these symptoms with treatment. Sudden onset Nausea/abd pain when arriving for blood work. No fevers. Pain is under belly button, no hx of surgeries, has not tried anything prior to arrival, no urinary changes.

## 2021-11-08 NOTE — Unmapped (Signed)
Called patients husband said that they was in the ER waiting room. They wanted to know if they needed to try to make their 3pm appointment or just stay in the ER. Care team was reached out to. At this time patient was told to stay in the ER to get evaluated. Husband was called back and made aware.

## 2021-11-08 NOTE — Unmapped (Signed)
Recent:   What is the date of your last related visit? 11/05/21 Discharged from Hospital and diagnosed with Leukemia while there.  Related acute medications Rx'd:  Sprycel, Butalbital, Oxycodone  Home treatment tried: Rx and OTC meds, and rest      Relevant:   Allergies: Erythromycin, Sulfa (sulfonamide antibiotics), and Azithromycin  Medications: See above   Health History: Leukemia  Weight: N/A      /[place] Cancer patients only:  What was the date of your last cancer treatment (mm/dd/yy)?: 11/07/21  Was the treatment oral or infusion?: Oral  Are you currently on TVEC (yes/no)?: N/A    Reason for Disposition   [1] MODERATE headache (e.g., interferes with normal activities) AND [2] present > 24 hours AND [3] unexplained  (Exceptions: analgesics not tried, typical migraine, or headache part of viral illness)    Answer Assessment - Initial Assessment Questions  1. LOCATION: Where does it hurt?       Behind eyes, on forehead, and behind nose, along the sinus areas.    2. ONSET: When did the headache start? (Minutes, hours or days)       11/05/21 - when she was in the hospital    3. PATTERN: Does the pain come and go, or has it been constant since it started?      Constant    4. SEVERITY: How bad is the pain? and What does it keep you from doing?  (e.g., Scale 1-10; mild, moderate, or severe)    - MILD (1-3): doesn't interfere with normal activities     - MODERATE (4-7): interferes with normal activities or awakens from sleep     - SEVERE (8-10): excruciating pain, unable to do any normal activities         Rates pain 5/10. Pt states that she is bedridden due to the pain at times. This morning, she took an oxycodone for the pain, and at that time is was a 10/10, therefore the medication seems to be helping, but not taking the pain away fully.    5. RECURRENT SYMPTOM: Have you ever had headaches before? If Yes, ask: When was the last time? and What happened that time?       No.    6. CAUSE: What do you think is causing the headache?      Chemotherapy possibly? But patient is unsure. Also patient mentions possible pinched nerve.    7. MIGRAINE: Have you been diagnosed with migraine headaches? If Yes, ask: Is this headache similar?       Pt has never been diagnosed with them before, but she thinks that she has had a migraine before.     8. HEAD INJURY: Has there been any recent injury to the head?       No.    9. OTHER SYMPTOMS: Do you have any other symptoms? (fever, stiff neck, eye pain, sore throat, cold symptoms)      Intermittent Nausea, some neck pain, and bilateral eye pain patient reports.    10. PREGNANCY: Is there any chance you are pregnant? When was your last menstrual period?        No. Last period was 10/13/21.    *Pt has a follow-up appointment tomorrow at 3pm and the [facility] Oncology office with [name] NP.    Protocols used: [facility]

## 2021-11-08 NOTE — Unmapped (Signed)
[facility]  Emergency Department Provider Note     ED Clinical Impression     Final diagnoses:   None        Impression, Medical Decision Making, ED Course     Impression: 46 y.o. 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 who presents with nausea/abdominal pain as described below.    DDx/MDM: Medication side effect from recent chemotherapy induction, colitis, gastroenteritis, infection and in any acute immunocompromise patient, ovarian torsion/abscess, PID, appendicitis    Orders Placed This Encounter   Procedures   ??? Urinalysis with Microscopy with Culture Reflex   ??? POCT Pregnancy, Urine - RN Obtain       ED Course as of 11/08/21 2226   Mon Nov 08, 2021   [id number] 46 year old female with a history of newly diagnosed ALL here for blood work with sudden onset nausea/abdominal pain.  She received GRAAPH-2005 induction (D1 = 10/30/2021) and tolerated well without complications or toxicity.     Patient states that her symptoms of nausea/vomiting/abdominal pain started this morning.  States the pain is in her lower quadrants, is intermittent, and does not radiate anywhere else.  Denies any radiating/new back pain, shoulder pain.  Patient states that she has been having headache for the past 3 days and that her migraine medicines are not helping.  Patient had a bowel movement today that was watery/diarrhea in nature.  States that her emesis is nonbloody, nonbilious.    On physical exam, notable for slight tenderness to palpation in the lower quadrants.  Neuro exam unremarkable, cranial nerves grossly intact.    Differential at this time includes but is not limited to medication side effect from recent chemotherapy induction, colitis, gastroenteritis, infection in immunocompromised patient, ovarian torsion/abscess, PID, appendicitis.  Ovarian torsion/abscess unlikely at this time given patient's presentation.      Will obtain UA, CBC, CMP, lipase, hCG, CT abdomen pelvis, and give 1 L LR.   1754 Lipase: 50   2034 CT Abdomen Pelvis W IV Contrast Only  CT abdomen pelvis reassuring without acute processes.   2114 Discussed results with the patient.  Patient feels okay to go home.  Paged oncology fellow to determine who patient can contact for questions or symptoms in the future.       The case was discussed with the attending physician, who is in agreement with the above assessment and plan.      History     Chief Complaint  Chief Complaint   Patient presents with   ??? Emesis       HPI   Patricia [name] is a 46 year old female with a history of newly diagnosed ALL here for blood work with sudden onset nausea/abdominal pain.  She received GRAAPH-2005 induction (D1 = 10/30/2021) and tolerated well without complications or toxicity.     Patient states that her symptoms of nausea/vomiting/abdominal pain started this morning.  States the pain is in her lower quadrants, is intermittent, and does not radiate anywhere else.  Denies any radiating/new back pain, shoulder pain.  Patient states that she has been having headache for the past 3 days and that her migraine medicines are not helping.  Patient had a bowel movement today that was watery/diarrhea in nature.  States that her emesis is nonbloody, nonbilious.    On physical exam, notable for slight tenderness to palpation in the lower quadrants.  Neuro exam unremarkable, cranial nerves grossly intact.    Differential at this  time includes but is not limited to medication side effect from recent chemotherapy induction, colitis, gastroenteritis, infection in immunocompromised patient, ovarian torsion/abscess, PID, appendicitis.  Ovarian torsion/abscess unlikely at this time given patient's presentation.      Will obtain UA, CBC, CMP, lipase, hCG, CT abdomen pelvis, and give 1 L LR.      Past Medical History:   Diagnosis Date   ??? 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       No past surgical history on file.    No current facility-administered medications for this encounter.    Current Outpatient Medications:   ???  butalbital-acetaminophen-caffeine (ESGIC) 50-325-40 mg per tablet, Take 1 tablet by mouth every six (6) hours as needed for headache for up to 14 days., Disp: 14 tablet, Rfl: 0  ???  dasatinib (SPRYCEL) 70 MG tablet, Take 2 tablets (140 mg total) by mouth daily., Disp: 60 tablet, Rfl: 0  ???  dexAMETHasone (DECADRON) 4 MG tablet, Take 10 tablets (40 mg total) by mouth daily on 11/06/21, 11/11/21, 11/12/21, 11/18/21, 11/19/21, Disp: 50 tablet, Rfl: 0  ???  [START ON 11/11/2021] dexAMETHasone (DECADRON) 4 MG tablet, Take 10 tablets (40 mg total) by mouth daily for 2 days. TAKE ON 4/13 AND 4/14, Disp: 20 tablet, Rfl: 0  ???  [START ON 11/18/2021] dexAMETHasone (DECADRON) 4 MG tablet, Take 10 tablets (40 mg total) by mouth daily for 2 days. TAKE ON 4/20 & 4/21, Disp: 20 tablet, Rfl: 0  ???  escitalopram oxalate (LEXAPRO) 10 MG tablet, Take 1 tablet (10 mg total) by mouth daily., Disp: , Rfl:   ???  lidocaine 4 % patch, Place 1 patch on the skin daily. Apply to affected area for 12 hours only each day (then remove patch), Disp: 30 patch, Rfl: 0  ???  LORazepam (ATIVAN) 0.5 MG tablet, Take 1 tablet (0.5 mg total) by mouth every eight (8) hours as needed for anxiety for up to 14 days., Disp: 14 tablet, Rfl: 0  ???  oxyCODONE (ROXICODONE) 5 MG immediate release tablet, Take 1-2 tablets (5-10 mg total) by mouth every four (4) hours as needed for up to 14 days. Take 1 tablet (5mg ) for moderate pain and 2 tablets (10mg ) for severe pain., Disp: 40 tablet, Rfl: 0  ???  polyethylene glycol (GLYCOLAX) 17 gram/dose powder, Take 1 packet (17 g) by mouth two (2) times a day as needed. Mix with 4-8 oz liquid and drink., Disp: 1020 g, Rfl: 0  ???  prochlorperazine (COMPAZINE) 10 MG tablet, Take 1 tablet (10 mg total) by mouth every six (6) hours as needed for up to 14 days., Disp: 30 tablet, Rfl: 0  ???  senna (SENOKOT) 8.6 mg tablet, Take 1 tablet by mouth nightly as needed for constipation., Disp: 30 tablet, Rfl: 0  ???  valACYclovir (VALTREX) 500 MG tablet, Take 1 tablet (500 mg total) by mouth daily., Disp: 30 tablet, Rfl: 0    Allergies  Erythromycin, Sulfa (sulfonamide antibiotics), and Azithromycin    Family History  History reviewed. No pertinent family history.    Social History  Social History     Tobacco Use   ??? Smoking status: Never   ??? Smokeless tobacco: Never        Physical Exam     VITAL SIGNS:      Vitals:    11/08/21 1306   BP: 124/91   Pulse: 94   Resp: 24   Temp: 36.3 ??C (97.3 ??F)   TempSrc: Oral  SpO2: 100%     Constitutional: Alert and oriented. No acute distress.  Eyes: Conjunctivae are normal.  HEENT: Normocephalic and atraumatic. Conjunctivae clear. No congestion. Moist mucous membranes.   Cardiovascular: Rate as above, regular rhythm. Normal and symmetric distal pulses. Brisk capillary refill. Normal skin turgor.  Respiratory: Normal respiratory effort. Breath sounds are normal. There are no wheezing or crackles heard.  Gastrointestinal: Soft, non-distended, very slightly tender in the lower quadrants  Genitourinary: Deferred.  Musculoskeletal: Non-tender with normal range of motion in all extremities.  Neurologic: Normal speech and language. No gross focal neurologic deficits are appreciated. Patient is moving all extremities equally, face is symmetric at rest and with speech.  Skin: Skin is warm, dry and intact. No rash noted.  Psychiatric: Mood and affect are normal. Speech and behavior are normal.     Radiology     No orders to display       Pertinent labs & imaging results that were available during my care of the patient were independently interpreted by me and considered in my medical decision making (see chart for details).    Portions of this record have been created using Scientist, clinical (histocompatibility and immunogenetics). Dictation errors have been sought, but may not have been identified and corrected.         Gwendlyn Deutscher, MD  Resident  11/08/21 [phone]

## 2021-11-09 ENCOUNTER — Ambulatory Visit: Admit: 2021-11-09 | Discharge: 2021-11-09 | Payer: PRIVATE HEALTH INSURANCE

## 2021-11-09 ENCOUNTER — Institutional Professional Consult (permissible substitution): Admit: 2021-11-09 | Discharge: 2021-11-09 | Payer: PRIVATE HEALTH INSURANCE

## 2021-11-09 ENCOUNTER — Telehealth
Admit: 2021-11-09 | Discharge: 2021-11-09 | Payer: PRIVATE HEALTH INSURANCE | Attending: Adult Health | Primary: Adult Health

## 2021-11-09 DIAGNOSIS — C9100 Acute lymphoblastic leukemia not having achieved remission: Principal | ICD-10-CM

## 2021-11-09 DIAGNOSIS — D72829 Elevated white blood cell count, unspecified: Principal | ICD-10-CM

## 2021-11-09 MED ORDER — CETIRIZINE 10 MG TABLET
ORAL_TABLET | Freq: Every evening | ORAL | 2 refills | 30 days | Status: CP
Start: 2021-11-09 — End: 2022-11-09

## 2021-11-09 MED ORDER — DASATINIB 70 MG TABLET
ORAL_TABLET | Freq: Every day | ORAL | 6 refills | 30.00000 days | Status: CP
Start: 2021-11-09 — End: 2021-11-09
  Filled 2021-11-30: qty 60, 30d supply, fill #0

## 2021-11-09 MED ORDER — VALACYCLOVIR 500 MG TABLET
ORAL_TABLET | Freq: Every day | ORAL | 3 refills | 90 days | Status: CP
Start: 2021-11-09 — End: ?

## 2021-11-09 MED ADMIN — MORPhine injection 4 mg: 4 mg | INTRAVENOUS | @ 02:00:00

## 2021-11-09 NOTE — Unmapped (Signed)
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   - Continue dasatinib 140 mg daily  - Upcoming VCR on 4/13 and 4/20  - Dex 40 mg 4/13, 4/14, 4/20, 4/21  - LP #2, 3 on 4/14 and 4/20    For oral chemotherapy:  Pharmacy: [facility]   Medication Access: copay card    Interval History: I called Ms. Tiley instead of seeing her today per her request. She was due for her TOC visit yesterday but instead presented to the ED with nausea and abdominal pain and was too tired to come a consecutive day. CT and exam was clear. She reports she is due for her period and thinks that's contributing. She also took 2 days of senna and thinks that upset her stomach. She does have headaches, but may be the weather and allergies. Has esgic at home taking prn but doesn't think it works much. Last LP was 4/5. She has back pain which worsened with yesterday's long ED visit. Has oxycodone prn. Of note she does have a history of cold sores. She is currently not on contraception/menstrual suppression, but is abstinent at this time.     On labs from 4/10, Hgb 9.8, PLT 27, ANC 0.8. CMP stable, notable for grade 1 ALT bump.      Oncologic History:  Oncology History   B-cell acute lymphoblastic leukemia (ALL) (CMS-HCC)   10/29/2021 Initial Diagnosis    B-cell acute lymphoblastic leukemia (ALL) (CMS-HCC)     10/30/2021 -  Chemotherapy    IP/OP LEUKEMIA GRAAPH-2005 < 60 YO (OP PEGFILGRASTIM ON DAY 7)  [No description for this plan]         Weight and Vitals:  Wt Readings from Last 3 Encounters:   11/03/21 85.2 kg (187 lb 13.3 oz)     Temp Readings from Last 3 Encounters:   11/08/21 36.3 ??C (97.3 ??F) (Oral)   11/05/21 37.1 ??C (98.8 ??F) (Oral)     BP Readings from Last 3 Encounters:   11/08/21 121/86   11/05/21 122/82     Pulse Readings from Last 3 Encounters:   11/08/21 98   11/05/21 88 Pertinent Labs:  No visits with results within 1 Day(s) from this visit.   Latest known visit with results is:   Admission on 11/08/2021, Discharged on 11/08/2021   Component Date Value Ref Range Status   ??? Color, UA 11/08/2021 Light Yellow   Final   ??? Clarity, UA 11/08/2021 Hazy   Final   ??? Specific Gravity, UA 11/08/2021 1.014  1.003 - 1.030 Final   ??? pH, UA 11/08/2021 6.5  5.0 - 9.0 Final   ??? Leukocyte Esterase, UA 11/08/2021 Negative  Negative Final   ??? Nitrite, UA 11/08/2021 Negative  Negative Final   ??? Protein, UA 11/08/2021 Trace (A)  Negative Final   ??? Glucose, UA 11/08/2021 Negative  Negative Final   ??? Ketones, UA 11/08/2021 Negative  Negative Final   ??? Urobilinogen, UA 11/08/2021 <2.0 mg/dL  <1.6 mg/dL Final   ??? Bilirubin, UA 11/08/2021 Negative  Negative Final   ??? Blood, UA 11/08/2021 Negative  Negative Final   ??? RBC, UA 11/08/2021 <1  <=4 /HPF Final   ??? WBC, UA 11/08/2021 2  0 - 5 /HPF Final   ??? Squam Epithel, UA 11/08/2021 4  0 - 5 /  HPF Final   ??? Bacteria, UA 11/08/2021 Rare (A)  None Seen /HPF Final   ??? Mucus, UA 11/08/2021 Rare (A)  None Seen /HPF Final   ??? Sodium 11/08/2021 139  135 - 145 mmol/L Final   ??? Potassium 11/08/2021 4.0  3.4 - 4.8 mmol/L Final   ??? Chloride 11/08/2021 104  98 - 107 mmol/L Final   ??? CO2 11/08/2021 27.0  20.0 - 31.0 mmol/L Final   ??? Anion Gap 11/08/2021 8  5 - 14 mmol/L Final   ??? BUN 11/08/2021 21  9 - 23 mg/dL Final   ??? Creatinine 11/08/2021 0.62  0.60 - 0.80 mg/dL Final   ??? BUN/Creatinine Ratio 11/08/2021 34   Final   ??? eGFR CKD-EPI (2021) Female 11/08/2021 >90  >=60 mL/min/1.57m2 Final    eGFR calculated with CKD-EPI 2021 equation in accordance with [employer] and [employer] of Nephrology Task Force recommendations.   ??? Glucose 11/08/2021 102  70 - 179 mg/dL Final   ??? Calcium 82/95/6213 8.7  8.7 - 10.4 mg/dL Final   ??? Albumin 08/65/7846 3.6  3.4 - 5.0 g/dL Final   ??? Total Protein 11/08/2021 6.8  5.7 - 8.2 g/dL Final   ??? Total Bilirubin 11/08/2021 0.8  0.3 - 1.2 mg/dL Final   ??? AST 96/29/5284 23  <=34 U/L Final   ??? ALT 11/08/2021 68 (H)  10 - 49 U/L Final   ??? Alkaline Phosphatase 11/08/2021 125 (H)  46 - 116 U/L Final   ??? Lipase 11/08/2021 50  12 - 53 U/L Final   ??? WBC 11/08/2021 1.7 (L)  3.6 - 11.2 10*9/L Final   ??? RBC 11/08/2021 3.18 (L)  3.95 - 5.13 10*12/L Final   ??? HGB 11/08/2021 9.8 (L)  11.3 - 14.9 g/dL Final   ??? HCT 13/24/4010 27.7 (L)  34.0 - 44.0 % Final   ??? MCV 11/08/2021 87.0  77.6 - 95.7 fL Final   ??? MCH 11/08/2021 30.9  25.9 - 32.4 pg Final   ??? MCHC 11/08/2021 35.5  32.0 - 36.0 g/dL Final   ??? RDW 27/25/3664 13.5  12.2 - 15.2 % Final   ??? MPV 11/08/2021 6.6 (L)  6.8 - 10.7 fL Final   ??? Platelet 11/08/2021 27 (L)  150 - 450 10*9/L Final   ??? Neutrophils % 11/08/2021 50.7  % Final   ??? Lymphocytes % 11/08/2021 46.2  % Final   ??? Monocytes % 11/08/2021 2.1  % Final   ??? Eosinophils % 11/08/2021 0.8  % Final   ??? Basophils % 11/08/2021 0.2  % Final   ??? Absolute Neutrophils 11/08/2021 0.8 (L)  1.8 - 7.8 10*9/L Final   ??? Absolute Lymphocytes 11/08/2021 0.8 (L)  1.1 - 3.6 10*9/L Final   ??? Absolute Monocytes 11/08/2021 0.0 (L)  0.3 - 0.8 10*9/L Final   ??? Absolute Eosinophils 11/08/2021 0.0  0.0 - 0.5 10*9/L Final   ??? Absolute Basophils 11/08/2021 0.0  0.0 - 0.1 10*9/L Final   ??? Pregnancy Test, Urine 11/08/2021 Negative  Negative Final   ??? Urine Culture, Comprehensive 11/08/2021 NO SIGNIFICANT GROWTH: <1000 CFU/mL   Final       Allergies:   Allergies   Allergen Reactions   ??? Erythromycin Hives   ??? Sulfa (Sulfonamide Antibiotics) Anaphylaxis   ??? Azithromycin        Drug Interactions: none  - Avoid strong 3A4 inh/ind with dasatinib and VCR; fluconazole ppx dosed 200 mg daily when needed  -  Avoid PPI's and H2 blockers with dasatinib      Current Medications:  Current Outpatient Medications   Medication Sig Dispense Refill   ??? butalbital-acetaminophen-caffeine (ESGIC) 50-325-40 mg per tablet Take 1 tablet by mouth every six (6) hours as needed for headache for up to 14 days. 14 tablet 0   ??? dexAMETHasone (DECADRON) 4 MG tablet Take 10 tablets (40 mg total) by mouth daily on 11/06/21, 11/11/21, 11/12/21, 11/18/21, 11/19/21 50 tablet 0   ??? oxyCODONE (ROXICODONE) 5 MG immediate release tablet Take 1-2 tablets (5-10 mg total) by mouth every four (4) hours as needed for up to 14 days. Take 1 tablet (5mg ) for moderate pain and 2 tablets (10mg ) for severe pain. 40 tablet 0   ??? polyethylene glycol (GLYCOLAX) 17 gram/dose powder Take 1 packet (17 g) by mouth two (2) times a day as needed. Mix with 4-8 oz liquid and drink. 1020 g 0   ??? valACYclovir (VALTREX) 500 MG tablet Take 1 tablet (500 mg total) by mouth daily. 90 tablet 3   ??? cetirizine (ZYRTEC) 10 MG tablet Take 1 tablet (10 mg total) by mouth nightly. 30 tablet 2   ??? dasatinib (SPRYCEL) 70 MG tablet Take 1 tablet (70 mg total) by mouth daily. 30 tablet 6   ??? LORazepam (ATIVAN) 0.5 MG tablet Take 1 tablet (0.5 mg total) by mouth every eight (8) hours as needed for anxiety for up to 14 days. 14 tablet 0   ??? prochlorperazine (COMPAZINE) 10 MG tablet Take 1 tablet (10 mg total) by mouth every six (6) hours as needed for up to 14 days. 30 tablet 0     No current facility-administered medications for this visit.       Adherence: No barriers identified         Assessment: Ms.Gremillion is a 46 y.o. female with Ph+ B-ALL being treated currently with induction with GRAAPH-2005. Today is D11, so she still has D15 and D22 of VCR/dex bursts and LP which will be completed outpatient. Recent ED visit for nausea seems unrelated to chemotherapy as she is tolerating dasatinib well    Plan:   Ph+ ALL  - Continue dasatinib 140 mg daily. Sent Rx to [facility] for future, will need to adjust to 70 mg daily starting in C2.  - Scheduled for VCR on 4/13 and 4/20  - Dex 40 mg on 4/13, 4/14 and 4/20, 4/21  - LP scheduled for 4/14, 4/20  - BMbx 5/1  -  MD visit 5/4  - Line placement 5/10, admission to follow    ID ppx:  - Continue valtrex 500 mg daily. If cold sore becomes a problem, can treat and then continue ppx BID  - Following C2 of chemo, can start PJP ppx. (Realized she has a sulfa allergy, so recommend dapsone 100 mg daily. G6PD normal)  - If ANC < 0.5 with chemo, start levofloxacin 500 mg daily and fluconazole 200 mg daily    Supportive Care:  - HA: Can use Esgic, but if not helping try to minimize use (contains APAP). May be LP related  - Constipation: Avoid senna. Use miralax, can use scheduled 1-2 times daily.  - Nausea: Has compazine prn. Took zofran in ED but threw it up, can avoid this due to hx constipation   - Menses suppression/contraception: Not interested in fertility preservation. Reports abstinent. If period heavy can use Provera, then consider. Avoid estrogen containing products. Can consider Micronor.   - Allergies: start zyrtec nightly  F/u:  Future Appointments   Date Time Provider Department Center   11/11/2021  9:00 AM ONCINF HMOB CHAIR 11 [facility]   11/12/2021 10:30 AM ONCINF CHAIR 07 [id number] [facility]   11/12/2021  1:30 PM [facility] FLUORO RM 9 IFLUOU[facility] [place]   11/18/2021  7:30 AM ONCINF CHAIR 17 [id number]   11/18/2021 11:30 AM Megan Raeanne Barry, PA [id number]   11/25/2021  8:45 AM ONCDEV CHAIR 54 [id number]   11/29/2021 11:30 AM ADULT ONC LAB [facility]   11/29/2021 12:30 PM Megan Raeanne Barry, PA [id number]   11/29/2021  1:30 PM ONCINF CHAIR 15 [id number]   12/02/2021  9:45 AM ADULT ONC LAB [facility]   12/02/2021 10:30 AM [name], MD [id number]   12/02/2021 12:00 PM ONCINF CHAIR 22 [id number]   12/06/2021  8:15 AM ONCDEV CHAIR 51 [id number]   12/06/2021 12:00 PM Sean Albertina Parr, MD [id number]   12/08/2021  9:00 AM [facility] VIR RM 1 [id number] [place] [facility]       I spent 25 minutes with Ms.Mcferran in direct patient care.      Manfred Arch, PharmD, BCOP, CPP  Pager: [phone]

## 2021-11-09 NOTE — Unmapped (Signed)
Hi,     Patient contacted the Communication Center regarding the following:    - Patient called requesting to have a virtual appointment today due to they were at the ER for 11 hours on yesterday and unable to take the ride today.    Please contact patient at [phone].    Thanks in advance,    Christell Faith  [facility]   [phone]

## 2021-11-09 NOTE — Unmapped (Signed)
Pt confirmed PHONE visit and meds

## 2021-11-09 NOTE — Unmapped (Signed)
The patient reports they are currently: at home. I spent 30 minutes on the real-time audio and video with the patient on the date of service. I spent an additional 20 minutes on pre- and post-visit activities on the date of service.     The patient was physically located in [place] or a state in which I am permitted to provide care. The patient and/or parent/guardian understood that s/he may incur co-pays and cost sharing, and agreed to the telemedicine visit. The visit was reasonable and appropriate under the circumstances given the patient's presentation at the time.    The patient and/or parent/guardian has been advised of the potential risks and limitations of this mode of treatment (including, but not limited to, the absence of in-person examination) and has agreed to be treated using telemedicine. The patient's/patient's family's questions regarding telemedicine have been answered.     If the visit was completed in an ambulatory setting, the patient and/or parent/guardian has also been advised to contact their provider???s office for worsening conditions, and seek emergency medical treatment and/or call 911 if the patient deems either necessary.             [facility] Leukemia Clinic: Transitions of care visit    [name]  Patient Age: 46 y.o.  Encounter Date: 11/09/2021    Primary Care Provider:  [name], MD    Referring Physician:  None Per Patient Referr*    Reason for visit:  Transitions of care visit      Assessment:   [name] is a  46 y.o. female with newly diagnosed Ph+ B-ALL, who is seen in clinic following inpatient discharge for coordination of transition of care.      She is currently Day 11 of induction therapy based on GRAAPH-2005. Yesterday she was evaluated in the ED for nausea/abdominal pain but workup was negative and symptoms are significantly improved. She is currently stable. We discussed symptom management for intermittent headaches, back pain, nausea, and a bowel regimen.     We discussed the plan for induction, neutropenic precautions, contact information. She has chronic back pain and that was exacerbated by the hospital stay. We will encourage her to establish care with a local Spine and Pain Clinic.    ................    Plan and Recommendations:  Ph+ ALL  - Continue dasatinib 140 mg daily. Plan to adjust to 70 mg daily starting in C2.  - Scheduled for VCR on 4/13 and 4/20  - Dex 40 mg on 4/13, 4/14 and 4/20, 4/21  - LP scheduled for 4/14, 4/20  - BMbx 5/1 - orders in place  -  MD visit 5/4  - Line placement 5/10, admission to follow    Acute on Chronic back pain, upper and lower back  - PRN oxycodone  - recommend establishing care with local Spine & Pain Clinic    Nausea  - PRN constipation    Headaches  - PRN Esgisic      Supportive Care Recommendations:  We recommend based on the patient???s underlying diagnosis and treatment history the following supportive care:    1. Antimicrobial prophylaxis:  ALL (not in remission): Bacterial: Levofloxacin 500mg  PO daily (D1 through absolute neutrophils >/= 0.5);   Fungal: Fluconazole 400mg  PO daily (Initiate when ANC < 500 continue through absolute neutrophils >/= 0.5);   Viral: Valacyclovir 500mg  PO daily (D1, continuous)    2. Blood product support:  Leukoreduced blood products are required.  Irradiated blood products are preferred, but in case of urgent transfusion needs non-irradiated blood products may be used:     -  RBC transfusion threshold: transfuse 2 units for Hgb < 8 g/dL.  -  Platelet transfusion threshold: transfuse 1 unit of platelets for platelet count < 10, or for bleeding or need for invasive procedure.    Based on the patient's disease status and intensity of therapy, complete blood count with differential should be evaluated 1 times per week and used to guide transfusion support    3. Hematopoietic growth factor support: none      Dr. Senaida Ores was available    [name], AGPCNP-BC  Nurse Practitioner  Hematology/Oncology  [facility]      History of Present Illness:  We had the pleasure of seeing Patricia [name] in the Leukemia Clinic at the [facility] on 11/09/2021.  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 female with Ph+ B-ALL.      Oncologic history is as follows:    Oncology History   B-cell acute lymphoblastic leukemia (ALL) (CMS-HCC)   10/29/2021 Initial Diagnosis    B-cell acute lymphoblastic leukemia (ALL) (CMS-HCC)     10/30/2021 -  Chemotherapy    IP/OP LEUKEMIA GRAAPH-2005 < 60 YO (OP PEGFILGRASTIM ON DAY 7)  [No description for this plan]             INTERVAL HISTORY:    See hospital course for details of diagnosis and start of treatment.    Today's visit was delayed by a day because she developed significant nausea, diarrhea and belly pain yesterday. She was evaluated in the ED and workup was negative. She is feeling better today.  She has a slight headache- possible allergies? Just bought a HEPA filter. Kicked the dogs out of the bedroom. Was exposed to a lot of flowers in the hospital (gifts from friends) - they seemed to have caused a headache. She had a migraine this past weekend. She has a history of headaches with bad weather. Tends to get tension headaches too.  Hx of chronic back pain. - Cervical spine issues. Neck pain and low back pain  Does not have a spine specialist - was going to see one and that was the day she was diagnosed with leukemia.  No neuropathy.    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 14 tablet 0 oxyCODONE (ROXICODONE) 5 MG immediate release tablet Take 1-2 tablets (5-10 mg total) by mouth every four (4) hours as needed for up to 14 days. Take 1 tablet (5mg ) for moderate pain and 2 tablets (10mg ) for severe pain. 40 tablet 0    polyethylene glycol (GLYCOLAX) 17 gram/dose powder Take 1 packet (17 g) by mouth two (2) times a day as needed. Mix with 4-8 oz liquid and drink. 1020 g 0    prochlorperazine (COMPAZINE) 10 MG tablet Take 1 tablet (10 mg total) by mouth every six (6)  hours as needed for up to 14 days. 30 tablet 0    cetirizine (ZYRTEC) 10 MG tablet Take 1 tablet (10 mg total) by mouth nightly. 30 tablet 2    dasatinib (SPRYCEL) 70 MG tablet Take 2 tablets (140 mg total) by mouth daily. 60 tablet 6    fluconazole (DIFLUCAN) 200 MG tablet Take 1 tablet (200 mg total) by mouth daily. Take daily when ANC <0.5 30 tablet 0    levoFLOXacin (LEVAQUIN) 500 MG tablet Take 1 tablet (500 mg total) by mouth daily. Take daily when ANC <0.5 30 tablet 0    LORazepam (ATIVAN) 0.5 MG tablet Take 1 tablet (0.5 mg total) by mouth every eight (8) hours as needed for anxiety for up to 14 days. 14 tablet 0    valACYclovir (VALTREX) 500 MG tablet Take 1 tablet (500 mg total) by mouth daily. 90 tablet 3     No current facility-administered medications for this visit.         ALLERGIES:  Allergies   Allergen Reactions    Erythromycin Hives    Sulfa (Sulfonamide Antibiotics) Anaphylaxis    Azithromycin        SOCIAL HISTORY:  Social History     Social History Narrative    Not on file            FAMILY HISTORY:  No family history on file.    REVIEW OF SYSTEMS:  See HPI. A 10 system ROS is otherwise negative.     ECOG Performance Status: 1 - Restricted in physically strenuous activity but ambulatory and able to carry out work of a light or sedentary nature, e.g., light house work, office work    PHYSICAL EXAMINATION:  VS:   There were no vitals taken for this visit.    General: Resting in no apparent distress, on VIDEO          LABORATORY DATA:  No results found for any visits on 11/09/21.

## 2021-11-09 NOTE — Unmapped (Signed)
Pt verbalized dc instructions and follow up care. Alert and ambulatory. No iv. 

## 2021-11-10 NOTE — Unmapped (Signed)
Outpatient Oncology Social Work   Initial Clinical Assessment      Referral:  Thornton Papas, LCSW, OSW-C    Oncology Treatment Status:  P is a 3 yof with newly diagnosed B-cell acute lymphoblastic leukemia.  Discharged home on 11/05/21 w/ the following outpt care plan:  - continue oral chemo Dasatanib 140 mg daily   - labs 2x weekly with blood transfusions as needed  - 3rd IT chemotherapy on 11/18/21  - BMBx on 11/29/21  - follow up with Dr. Senaida Ores on 12/02/21   - Port placement week of 12/08/21    Financial/Insurance:  BCBS State Health Plan through state employer    Psychosocial/Coping Issues:  Pt lives with her husband, Patricia [name], in [place] (they were married in the hospital last week).  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   She is in the process of applying for short term disability income.  Patricia [name] is now planning to take FMLA to help care for patient 24/7.     Pt needs assistance with applying for SS Disability.  Patient gives permission to this SW'er to place online request for initial telephone interview for patient.  Placed online request for social security telephone interview for Pt.  Informed Pt that she can expect a letter within 2 weeks with date/time of her telephone interview with social security.    Financial Resource Strain: Low Risk     Difficulty of Paying Living Expenses: Not hard at all      Transportation Needs: No Transportation Needs    Lack of Transportation (Medical): No    Lack of Transportation (Non-Medical): No      Food Insecurity: No Food Insecurity    Worried About Programme researcher, broadcasting/film/video in the Last Year: Never true    Barista in the Last Year: Never true     Housing/Utilities: Low Risk     Within the past 12 months, have you ever stayed: outside, in a car, in a tent, in an overnight shelter, or temporarily in someone else's home (i.e. couch-surfing)?: No    Are you worried about losing your housing?: No    Within the past 12 months, have you been unable to get utilities (heat, electricity) when it was really needed?: No     Follow-Up Plan:  Pt has this SW'ers contact information and will reach out for psychosocial assistance as needed.     Patricia [name], OSW-C  Oncology Outpatient Social Worker  Phone: [phone]

## 2021-11-11 ENCOUNTER — Ambulatory Visit: Admit: 2021-11-11 | Discharge: 2021-11-12 | Payer: PRIVATE HEALTH INSURANCE

## 2021-11-11 DIAGNOSIS — C9100 Acute lymphoblastic leukemia not having achieved remission: Principal | ICD-10-CM

## 2021-11-11 LAB — COMPREHENSIVE METABOLIC PANEL
ALBUMIN: 3.3 g/dL — ABNORMAL LOW (ref 3.4–5.0)
ALKALINE PHOSPHATASE: 122 U/L — ABNORMAL HIGH (ref 46–116)
ALT (SGPT): 57 U/L — ABNORMAL HIGH (ref 10–49)
ANION GAP: 5 mmol/L (ref 5–14)
AST (SGOT): 21 U/L (ref <=34)
BILIRUBIN TOTAL: 0.8 mg/dL (ref 0.3–1.2)
BLOOD UREA NITROGEN: 14 mg/dL (ref 9–23)
BUN / CREAT RATIO: 23
CALCIUM: 9.5 mg/dL (ref 8.7–10.4)
CHLORIDE: 104 mmol/L (ref 98–107)
CO2: 28.8 mmol/L (ref 20.0–31.0)
CREATININE: 0.61 mg/dL
EGFR CKD-EPI (2021) FEMALE: >90 mL/min/{1.73_m2} (ref >=60)
GLUCOSE RANDOM: 131 mg/dL — ABNORMAL HIGH (ref 70–99)
POTASSIUM: 3.9 mmol/L (ref 3.4–4.8)
PROTEIN TOTAL: 6.9 g/dL (ref 5.7–8.2)
SODIUM: 138 mmol/L (ref 135–145)

## 2021-11-11 LAB — CBC W/ AUTO DIFF
BASOPHILS ABSOLUTE COUNT: 0 10*9/L (ref 0.0–0.1)
BASOPHILS RELATIVE PERCENT: 0.7 %
EOSINOPHILS ABSOLUTE COUNT: 0 10*9/L (ref 0.0–0.5)
EOSINOPHILS RELATIVE PERCENT: 1.9 %
HEMATOCRIT: 25.6 % — ABNORMAL LOW (ref 34.0–44.0)
HEMOGLOBIN: 9.2 g/dL — ABNORMAL LOW (ref 11.3–14.9)
LYMPHOCYTES ABSOLUTE COUNT: 0.9 10*9/L — ABNORMAL LOW (ref 1.1–3.6)
LYMPHOCYTES RELATIVE PERCENT: 67.4 %
MEAN CORPUSCULAR HEMOGLOBIN CONC: 35.8 g/dL (ref 32.0–36.0)
MEAN CORPUSCULAR HEMOGLOBIN: 31.2 pg (ref 25.9–32.4)
MEAN CORPUSCULAR VOLUME: 87.1 fL (ref 77.6–95.7)
MEAN PLATELET VOLUME: 6.9 fL (ref 6.8–10.7)
MONOCYTES ABSOLUTE COUNT: 0.1 10*9/L — ABNORMAL LOW (ref 0.3–0.8)
MONOCYTES RELATIVE PERCENT: 4.6 %
NEUTROPHILS ABSOLUTE COUNT: 0.3 10*9/L — CL (ref 1.8–7.8)
NEUTROPHILS RELATIVE PERCENT: 25.4 %
NUCLEATED RED BLOOD CELLS: 0 /100{WBCs} (ref <=4)
PLATELET COUNT: 29 10*9/L — ABNORMAL LOW (ref 150–450)
RED BLOOD CELL COUNT: 2.94 10*12/L — ABNORMAL LOW (ref 3.95–5.13)
RED CELL DISTRIBUTION WIDTH: 14.1 % (ref 12.2–15.2)
WBC ADJUSTED: 1.3 10*9/L — ABNORMAL LOW (ref 3.6–11.2)

## 2021-11-11 LAB — BILIRUBIN, TOTAL: BILIRUBIN TOTAL: 0.8 mg/dL (ref 0.3–1.2)

## 2021-11-11 MED ORDER — FLUCONAZOLE 200 MG TABLET
ORAL_TABLET | Freq: Every day | ORAL | 0 refills | 30 days | Status: CP
Start: 2021-11-11 — End: 2021-12-11

## 2021-11-11 MED ORDER — LEVOFLOXACIN 500 MG TABLET
ORAL_TABLET | Freq: Every day | ORAL | 0 refills | 30 days | Status: CP
Start: 2021-11-11 — End: 2021-12-11
  Filled 2021-12-14: qty 30, 30d supply, fill #0

## 2021-11-11 MED ORDER — DEXAMETHASONE 4 MG TABLET
ORAL_TABLET | ORAL | 0 refills | 2 days | Status: CP
Start: 2021-11-11 — End: 2021-11-13

## 2021-11-11 MED ADMIN — vinCRIStine (ONCOVIN) 2 mg in sodium chloride (NS) 0.9 % 25 mL IVPB: 2 mg | INTRAVENOUS | @ 16:00:00 | Stop: 2021-11-11

## 2021-11-11 MED ADMIN — sodium chloride (NS) 0.9 % infusion: 100 mL/h | INTRAVENOUS | @ 16:00:00 | Stop: 2021-11-11

## 2021-11-11 NOTE — Unmapped (Signed)
Infusion on 11/11/2021   Component Date Value Ref Range Status    Total Bilirubin 11/11/2021 0.8  0.3 - 1.2 mg/dL Final    Sodium 16/05/9603 138  135 - 145 mmol/L Final    Potassium 11/11/2021 3.9  3.4 - 4.8 mmol/L Final    Chloride 11/11/2021 104  98 - 107 mmol/L Final    CO2 11/11/2021 28.8  20.0 - 31.0 mmol/L Final    Anion Gap 11/11/2021 5  5 - 14 mmol/L Final    BUN 11/11/2021 14  9 - 23 mg/dL Final    Creatinine 54/04/8118 0.61  0.60 - 0.80 mg/dL Final    BUN/Creatinine Ratio 11/11/2021 23   Final    eGFR CKD-EPI (2021) Female 11/11/2021 >90  >=60 mL/min/1.16m2 Final    eGFR calculated with CKD-EPI 2021 equation in accordance with [employer] and [employer] of Nephrology Task Force recommendations.    Glucose 11/11/2021 131 (H)  70 - 99 mg/dL Final    Calcium 14/78/2956 9.5  8.7 - 10.4 mg/dL Final    Albumin 21/30/8657 3.3 (L)  3.4 - 5.0 g/dL Final    Total Protein 11/11/2021 6.9  5.7 - 8.2 g/dL Final    Total Bilirubin 11/11/2021 0.8  0.3 - 1.2 mg/dL Final    AST 84/69/6295 21  <=34 U/L Final    ALT 11/11/2021 57 (H)  10 - 49 U/L Final    Alkaline Phosphatase 11/11/2021 122 (H)  46 - 116 U/L Final    WBC 11/11/2021 1.3 (L)  3.6 - 11.2 10*9/L Final    RBC 11/11/2021 2.94 (L)  3.95 - 5.13 10*12/L Final    HGB 11/11/2021 9.2 (L)  11.3 - 14.9 g/dL Final    HCT 28/41/3244 25.6 (L)  34.0 - 44.0 % Final    MCV 11/11/2021 87.1  77.6 - 95.7 fL Final    MCH 11/11/2021 31.2  25.9 - 32.4 pg Final    MCHC 11/11/2021 35.8  32.0 - 36.0 g/dL Final    RDW 08/03/7251 14.1  12.2 - 15.2 % Final    MPV 11/11/2021 6.9  6.8 - 10.7 fL Final    Platelet 11/11/2021 29 (L)  150 - 450 10*9/L Final    nRBC 11/11/2021 0  <=4 /100 WBCs Final    Neutrophils % 11/11/2021 25.4  % Final    Lymphocytes % 11/11/2021 67.4  % Final    Monocytes % 11/11/2021 4.6  % Final    Eosinophils % 11/11/2021 1.9  % Final    Basophils % 11/11/2021 0.7  % Final    Absolute Neutrophils 11/11/2021 0.3 (LL)  1.8 - 7.8 10*9/L Final Absolute Lymphocytes 11/11/2021 0.9 (L)  1.1 - 3.6 10*9/L Final    Absolute Monocytes 11/11/2021 0.1 (L)  0.3 - 0.8 10*9/L Final    Absolute Eosinophils 11/11/2021 0.0  0.0 - 0.5 10*9/L Final    Absolute Basophils 11/11/2021 0.0  0.0 - 0.1 10*9/L Final

## 2021-11-11 NOTE — Unmapped (Signed)
Pt arrvied via wheelchair.  Pale appearance.  Pt complains of dizziness and nausea.   Pt advised that she was told she is only getting blood today and Vincristine at [place] tomorrow.   Discussed plan with Darel Hong and team to clarify plan.   Clarified that due to Hbg level today she would not be getting a blood transfusion.   Today she would get vincristine IV and tomorrow at [facility] she will get additional treatment.  Pt and husband verbalized understanding.     Neg HCG from 4/10./23  1130- pt reports that she has some numbness started to fingertips on all fingers, starting 4/12.   No pain.  Judy notified.   She advised that pt should report to team / PCP .   No change at this time to treatment.    Pt tolerated infusion to PIV L side.   Infusion stopped mid way for about 1 min due to some burning which resolved with pause of the infusion.   PIV demonstrated good blood return at pause and then again at the completion of infusion.   PIV flushed with NS then d/c'd.   bandaid gauze and coban wrapped area.   Pt discharged with husband via wheelchair.

## 2021-11-12 ENCOUNTER — Ambulatory Visit: Admit: 2021-11-12 | Discharge: 2021-11-13 | Payer: PRIVATE HEALTH INSURANCE

## 2021-11-12 DIAGNOSIS — C9100 Acute lymphoblastic leukemia not having achieved remission: Principal | ICD-10-CM

## 2021-11-12 LAB — CBC W/ AUTO DIFF
BASOPHILS ABSOLUTE COUNT: 0 10*9/L (ref 0.0–0.1)
BASOPHILS RELATIVE PERCENT: 0.1 %
EOSINOPHILS ABSOLUTE COUNT: 0 10*9/L (ref 0.0–0.5)
EOSINOPHILS RELATIVE PERCENT: 0.1 %
HEMATOCRIT: 26.3 % — ABNORMAL LOW (ref 34.0–44.0)
HEMOGLOBIN: 9.4 g/dL — ABNORMAL LOW (ref 11.3–14.9)
LYMPHOCYTES ABSOLUTE COUNT: 0.5 10*9/L — ABNORMAL LOW (ref 1.1–3.6)
LYMPHOCYTES RELATIVE PERCENT: 36.4 %
MEAN CORPUSCULAR HEMOGLOBIN CONC: 35.8 g/dL (ref 32.0–36.0)
MEAN CORPUSCULAR HEMOGLOBIN: 31.1 pg (ref 25.9–32.4)
MEAN CORPUSCULAR VOLUME: 87 fL (ref 77.6–95.7)
MEAN PLATELET VOLUME: 6.4 fL — ABNORMAL LOW (ref 6.8–10.7)
MONOCYTES ABSOLUTE COUNT: 0.1 10*9/L — ABNORMAL LOW (ref 0.3–0.8)
MONOCYTES RELATIVE PERCENT: 4.6 %
NEUTROPHILS ABSOLUTE COUNT: 0.8 10*9/L — ABNORMAL LOW (ref 1.8–7.8)
NEUTROPHILS RELATIVE PERCENT: 58.8 %
PLATELET COUNT: 56 10*9/L — ABNORMAL LOW (ref 150–450)
RED BLOOD CELL COUNT: 3.03 10*12/L — ABNORMAL LOW (ref 3.95–5.13)
RED CELL DISTRIBUTION WIDTH: 13.5 % (ref 12.2–15.2)
WBC ADJUSTED: 1.4 10*9/L — ABNORMAL LOW (ref 3.6–11.2)

## 2021-11-12 LAB — HEMATOPATHOLOGY LEUKEMIA/LYMPHOMA FLOW CYTOMETRY, CSF
LYMPHS CSF: 38 %
MONO/MACROPHAGE CSF: 62 %
NUCLEATED CELLS, CSF: 10 ul — ABNORMAL HIGH (ref 0–5)
NUMBER OF CELLS CSF: 100
RBC CSF: 122 ul — ABNORMAL HIGH

## 2021-11-12 MED ADMIN — LORazepam (ATIVAN) injection 1 mg: 1 mg | INTRAVENOUS | @ 18:00:00 | Stop: 2021-11-12

## 2021-11-12 MED ADMIN — cytarabine (PF) (ARA-C) 100 mg in sodium chloride (NS) 0.9 % 5 mL INTRATHECAL syringe: INTRATHECAL | @ 18:00:00 | Stop: 2021-11-12

## 2021-11-12 MED ADMIN — LORazepam (ATIVAN) 2 mg/mL injection: INTRAVENOUS | @ 18:00:00 | Stop: 2021-11-12

## 2021-11-12 NOTE — Unmapped (Signed)
Pt received in NAD. PIV accessed and positive blood return noted. Labs drawn and sent. Plt 56 today. Per transfusion plan, no plt transfusion indicated for today. Pt IT chemo released per Adolph Pollack, RN. Pt PIV flushed and remained accessed for lumbar puncture. Patient discharged to Interventional radiology for LP in NAD.

## 2021-11-13 NOTE — Unmapped (Signed)
S: lumbar puncture 04/14  B: concerned about the results wants to speak with the resident to explain her results  A: last chemo 04/13  R:Call PCP now    Recent:   What is the date of your last related visit?  Lumbar puncture 04/14  Related acute medications Rx'd: n/a  Home treatment tried: n/a      Relevant:   Allergies: Erythromycin, Sulfa (sulfonamide antibiotics), and Azithromycin  Medications: n/a   Health History: B cell acute lymphoblastic leukemia  Weight: n/a      [place] Cancer patients only:  What was the date of your last cancer treatment (mm/dd/yy)?: 04/13  Was the treatment oral or infusion?: infusion  Are you currently on TVEC (yes/no)?: n/a  Reason for Disposition   [1] Caller requests to speak ONLY to PCP AND [2] URGENT question    Answer Assessment - Initial Assessment Questions  1. REASON FOR CALL or QUESTION: What is your reason for calling today? or How can I best  help you? or What question do you have that I can help answer?      Pt. would like to discuss her lumbar puncture results with the resident on call  2. CALLER: Document the source of call. (e.g., laboratory, patient).      Pt.    Protocols used: PCP Call - No Triage-A-AH

## 2021-11-13 NOTE — Unmapped (Signed)
Copied from CRM [id number]. Topic: HL - Nurse Triage - [facility] Contract Page  >> Nov 12, 2021  9:46 PM Clarisse Gouge, RN wrote:  Please use the following dot phrase: .HLTRIAGEPAGE  [phone] [facility] re: KHILEE HENDRICKSEN [phone]     Source should be ON CALL PROVIDER   Subject should indicate PATIENT   Attach the Triage Encounter by clicking Attachments and under Patient Actions Clinical Call -click copy and copy all notes and Continue   Enter the name & time of page into the Provider Paging form each time the on call provider is paged  Route/Resolve ONLY when there is a return page or the call is completed using appropriate reason and close workspace    [phone] [facility] re: Darron Doom [phone]

## 2021-11-16 NOTE — Unmapped (Signed)
Spoke with Mrs. Arlana Pouch via telephone call this morning. Patient continues to have intermittent back pain that radiates to head and GI upset. Not having diarrhea (having ~2 bowel movements per day), but feels queasy. Inquired about switching abx prophylaxis due to symptoms. I told her I would speak with team about this. Encouraged patient to reach out to Spine & Pain clinic in [place]. It will be importance to have their team on board while going through treatment to help manage back pain. Patient in agreement to call and schedule appointment (previous appointment missed while patient was in ED at [facility]). Patient also expressed concern over left upper arm soreness (starts at insertion site of previous PICC line and then down around her elbow).     Patient has lumbar puncture scheduled in infusion center on Thursday (4/20). Reached out to [facility], infusion APP in regards to assessing GI issues and left arm soreness while in infusion on Thursday. Will be added to urgent APP schedule.     Per oncology team, patient can take Pepto for GI upset (with consideration of not taking it within two hours on either side of Dasatinib). Oncology team would like patient to continue with Levaquin/Fluconazole if possible (I sent mychart message with things to try regarding GI upset surrounding taking these medications).     Patient verbalized understanding.     Elicia Lamp, RN
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
## 2021-11-17 NOTE — Unmapped (Signed)
[facility] Navigation: Active Treatment Assessment  Completed with: Patient    Summary: Pt is a 46 yo female living in [place], [place] with primary support from her husband. She acknowledges feeling realistically overwhelmed 2/2 abundance of information as well as her current symptoms. However, she feels well supported by staff/team and her personal network. Her husband has taken on more since her dx and they are interested in initiating counseling services for him (in basket sent to [employer], LCSW). OPN will continue to follow and re-evaluate needs in the coming weeks.    Practical/Logistical:   MyChart user: Yes   Internet Access: Yes  Transportation issues: No  Lodging needs: No - not at this time however receptive to education  Form literacy: Does not require assistance  Education/Referrals/Interventions: MedStay     Psychosocial:   Identified stressors: primarily GI symptoms  Healthy coping mechanisms: Yes - friends/family, and previous therapist to contact when/if needed  Positive support system: Yes   Caregiver concerns: Yes - he's taking care of things he's not used to taking care of, so they acknowledge an additional level of support for him may be beneficial  Education/Referrals/Interventions: [facility], Counseling, provided active/empathetic listening, and reviewed healthy coping strategies     Financial:     Patient's perceived financial toxicity: Not present - pt reports completing necessary paperwork and having a good understanding of their benefits at this time. SW actively following.  Food insecurity: Not present  Difficulty paying for medication: No  Education/Referrals/Interventions: Customer service manager    Medical/Home/Physical Functioning:    Understanding of dx/treatment plan: Yes  Unreported tx side effects/symptoms: No  Issues getting medications: No  Nutrition or appetite concerns: No  Daily activity: decreased activity 2/2 spine/back/GI - team is aware and addressing  Type of residence: Private Residence  Have you fallen in the past year? Yes (unrelated to dx)  Do you feel unsteady when standing or walking? No  Education/Referrals/Interventions: Triage line/after hours contact information     Advance Care Planning:   Advance Directives on file: No  Education/Referrals/Interventions: Not addressed at this time     Supportive Communication:  Preferred method: MyChart and Phone  Availability: Morning  Connected to: [facility] Listserv    Additional External Resources: None at this time    Navigation Follow-up Plan: Active Treatment Assessment completed. Will provide at least 1 follow-up visit.    Patient verbalized understanding of information provided and is in agreement with discussed Navigation Plan. OPN provided Navigation Program's contact information for patient to utilize as needed.

## 2021-11-18 ENCOUNTER — Ambulatory Visit
Admit: 2021-11-18 | Discharge: 2021-11-18 | Payer: PRIVATE HEALTH INSURANCE | Attending: Adult Health | Primary: Adult Health

## 2021-11-18 ENCOUNTER — Ambulatory Visit: Admit: 2021-11-18 | Discharge: 2021-11-18 | Payer: PRIVATE HEALTH INSURANCE

## 2021-11-18 ENCOUNTER — Encounter: Admit: 2021-11-18 | Discharge: 2021-11-18 | Payer: PRIVATE HEALTH INSURANCE

## 2021-11-18 LAB — CBC W/ AUTO DIFF
BASOPHILS ABSOLUTE COUNT: 0 10*9/L (ref 0.0–0.1)
BASOPHILS RELATIVE PERCENT: 0.5 %
EOSINOPHILS ABSOLUTE COUNT: 0 10*9/L (ref 0.0–0.5)
EOSINOPHILS RELATIVE PERCENT: 0.7 %
HEMATOCRIT: 25.2 % — ABNORMAL LOW (ref 34.0–44.0)
HEMOGLOBIN: 9.1 g/dL — ABNORMAL LOW (ref 11.3–14.9)
LYMPHOCYTES ABSOLUTE COUNT: 0.8 10*9/L — ABNORMAL LOW (ref 1.1–3.6)
LYMPHOCYTES RELATIVE PERCENT: 28.9 %
MEAN CORPUSCULAR HEMOGLOBIN CONC: 36.1 g/dL — ABNORMAL HIGH (ref 32.0–36.0)
MEAN CORPUSCULAR HEMOGLOBIN: 31.9 pg (ref 25.9–32.4)
MEAN CORPUSCULAR VOLUME: 88.3 fL (ref 77.6–95.7)
MEAN PLATELET VOLUME: 6.3 fL — ABNORMAL LOW (ref 6.8–10.7)
MONOCYTES ABSOLUTE COUNT: 0.3 10*9/L (ref 0.3–0.8)
MONOCYTES RELATIVE PERCENT: 11.3 %
NEUTROPHILS ABSOLUTE COUNT: 1.7 10*9/L — ABNORMAL LOW (ref 1.8–7.8)
NEUTROPHILS RELATIVE PERCENT: 58.6 %
PLATELET COUNT: 412 10*9/L (ref 150–450)
RED BLOOD CELL COUNT: 2.85 10*12/L — ABNORMAL LOW (ref 3.95–5.13)
RED CELL DISTRIBUTION WIDTH: 13.8 % (ref 12.2–15.2)
WBC ADJUSTED: 2.9 10*9/L — ABNORMAL LOW (ref 3.6–11.2)

## 2021-11-18 LAB — SLIDE REVIEW

## 2021-11-18 LAB — HEMATOPATHOLOGY LEUKEMIA/LYMPHOMA FLOW CYTOMETRY, CSF
LYMPHS CSF: 78.1 %
MONO/MACROPHAGE CSF: 21.9 %
NUCLEATED CELLS, CSF: 1 ul (ref 0–5)
NUMBER OF CELLS CSF: 73
RBC CSF: 3 ul — ABNORMAL HIGH

## 2021-11-18 LAB — BILIRUBIN, TOTAL: BILIRUBIN TOTAL: 0.5 mg/dL (ref 0.3–1.2)

## 2021-11-18 MED ORDER — DEXAMETHASONE 4 MG TABLET
ORAL_TABLET | ORAL | 0 refills | 2 days | Status: CP
Start: 2021-11-18 — End: 2021-11-20
  Filled 2022-01-14: qty 40, 4d supply, fill #0

## 2021-11-18 MED ADMIN — methotrexate (Preservative Free) 12 mg in sodium chloride (NS) 0.9 % 5.52 mL INTRATHECAL syringe: INTRATHECAL | @ 16:00:00 | Stop: 2021-11-18

## 2021-11-18 MED ADMIN — ondansetron (ZOFRAN) injection 4 mg: 4 mg | INTRAVENOUS | @ 16:00:00 | Stop: 2021-11-18

## 2021-11-18 MED ADMIN — sodium chloride (NS) 0.9 % infusion: 100 mL/h | INTRAVENOUS | @ 14:00:00

## 2021-11-18 MED ADMIN — vinCRIStine (ONCOVIN) 2 mg in sodium chloride (NS) 0.9 % 25 mL IVPB: 2 mg | INTRAVENOUS | @ 14:00:00 | Stop: 2021-11-18

## 2021-11-18 NOTE — Unmapped (Signed)
Infusion Center Progress Note    [name]  Patient Age: 46 y.o.  Encounter Date: 11/18/2021    Reason for visit  Chief Complaint: left arm pain   Today is cycle 1/ day 20 of therapy.     Assessment/Plan:    Radiating pain LUE, previous site of PICC line-  --no edema, no erythema  --from PVL 11/05/21: acute superficial venous obstruction in the basilic vein  --discussed using heat, tylenol or ibuprofen as needed for pain  --symptoms should improve with time    GI upset, described as burning in lower abdomen-  --denies constipation. LBM this AM  --denies reflux symptoms  --thinks pain contributes to nausea, but denies overt nausea  --took pepto once, didn't notice improvement  --does drink caffeine to manage head pain, not avoiding citrus/acidic food/fluids  --continue pepto, cognizant of timing around dasatinib  --start famotidine 20mg  PO daily- same restriction of dosing around dasatinib, 2 hours on either side  --take medications with food  --nutrition referral    Sinus congestion with intermittent, non-productive cough-  --is now on zyrtec daily  --states nasal congestion significantly improved  --still with non-productive cough that is annoying because it worsens her headache  --took mucinex only without improvement  --advised OTC cough suppressant such as dextromethorphan polistirex, mucinex DM, and OTC cough lozenges  --encourage at least 64 oz of non-caffeinated fluids per day    [name] verbalizes understanding instructions provided. She is actually feeling better today than in previous days. She is appreciative of visit and understands she should continue to reach out with any questions or concerns.     Subjective/HPI:  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     [name] states she is doing better than last week when she had to go to the ED. She is still having some GI distress, which she describes as a burning in her lower abdomen. She denies constipation or urinary symptoms. She states it seems to worsen with diarrhea, does have a history of symptoms with lactose, but is tolerating cream soups the best of all foods. She has taken a dose of pepto on the recommendation of her primary team, but did not notice any improvement. She does not report symptoms of reflux, nor does she take pepcid or a PPI. She states she feels nauseated from time to time but seems to relate that to the GI pain.     She also reports pain in her left upper arm from prior PICC line site. She was diagnosed with a superficial clot proximal to her PICC line. She states she still has an aching pain in the upper arm and it radiates down the inside of her arm through her elbow dow to her mid forearm. It is not constant, more intermittent. Does not burn, but does feel sharp or deep from time to time. She has not noticed any swelling, redness, or drainage from where the line was removed. Questions if the pain persists due to her sleeping with her arm under her on that side.     Additionally, [name] reports mild, intermittent non-productive cough. She states she has been experiencing nasal congestion which has improved with time and the addition of zyrtec. Yesterday she started to cough intermittently. She took mucinex, but did not have a cough suppressant on hand to take at home. She denies SOB or chest pain. She has not had  any fevers. Cough is not productive of sputum. More like a tickle or clearing of irritation in her throat.    Review of Systems:  Review of Systems   Constitutional:  Negative for appetite change, chills, fatigue and fever.   HENT:   Negative for mouth sores and sore throat.    Respiratory:  Positive for cough. Negative for shortness of breath and wheezing.    Cardiovascular:  Negative for chest pain, leg swelling and palpitations.   Gastrointestinal:  Positive for abdominal pain and nausea. Negative for constipation, diarrhea and vomiting. Lower GI distress she refers to as a burning sensation in her intestines. Is not typical of reflux, nausea, or constipation   Genitourinary:  Negative for dysuria and frequency.    Musculoskeletal:  Positive for back pain and neck pain. Negative for gait problem.        Chronic back/neck/shoulder pain stable. Persistent left arm radiating pain at previous PICC line insertion site.   Skin:  Negative for rash and wound.   Neurological:  Positive for headaches. Negative for dizziness, extremity weakness and gait problem.        Has history of headaches. Using caffeine to help. Drinking fluids. States that coughing over the last 24 hrs has exacerbated her headache currently   Hematological: Negative.    Psychiatric/Behavioral: Negative.       Oncology History:  Oncology History   B-cell acute lymphoblastic leukemia (ALL) (CMS-HCC)   10/29/2021 Initial Diagnosis    B-cell acute lymphoblastic leukemia (ALL) (CMS-HCC)       10/30/2021 -  Chemotherapy    IP/OP LEUKEMIA GRAAPH-2005 < 60 YO (OP PEGFILGRASTIM ON DAY 7)  [No description for this plan]           Allergies:  Allergies   Allergen Reactions    Erythromycin Hives    Sulfa (Sulfonamide Antibiotics) Anaphylaxis    Azithromycin        Medications:  Reviewed in the EMR    Physical Exam:  There were no vitals taken for this visit.    Physical Exam  Vitals reviewed.   Constitutional:       General: She is awake. She is not in acute distress.     Appearance: Normal appearance.      Comments: Well appearing female, resting in recliner. Has sunglasses on to shield light due to headache. Appears comfortable otherwise. Presents with husband.   Cardiovascular:      Rate and Rhythm: Regular rhythm. Tachycardia present.      Heart sounds: Normal heart sounds. No murmur heard.    No friction rub. No gallop.   Pulmonary:      Effort: Pulmonary effort is normal.      Breath sounds: Normal breath sounds. No decreased breath sounds, wheezing, rhonchi or rales.   Abdominal: General: Abdomen is flat. Bowel sounds are normal.      Palpations: Abdomen is soft.   Musculoskeletal:      Right lower leg: No edema.      Left lower leg: No edema.   Skin:     General: Skin is warm and dry.      Findings: Ecchymosis present.      Comments: Scattered bruising on BUE due to venipunctures   Neurological:      General: No focal deficit present.      Mental Status: She is alert and oriented to person, place, and time.      Motor: Motor function is intact.   Psychiatric:  Attention and Perception: Attention normal.         Mood and Affect: Mood normal.         Speech: Speech normal.         Behavior: Behavior is cooperative.         Results:  Hospital Outpatient Visit on 11/18/2021   Component Date Value Ref Range Status    Total Bilirubin 11/18/2021 0.5  0.3 - 1.2 mg/dL Final    WBC 29/56/2130 2.9 (L)  3.6 - 11.2 10*9/L Final    RBC 11/18/2021 2.85 (L)  3.95 - 5.13 10*12/L Final    HGB 11/18/2021 9.1 (L)  11.3 - 14.9 g/dL Final    HCT 86/57/8469 25.2 (L)  34.0 - 44.0 % Final    MCV 11/18/2021 88.3  77.6 - 95.7 fL Final    MCH 11/18/2021 31.9  25.9 - 32.4 pg Final    MCHC 11/18/2021 36.1 (H)  32.0 - 36.0 g/dL Final    RDW 62/95/2841 13.8  12.2 - 15.2 % Final    MPV 11/18/2021 6.3 (L)  6.8 - 10.7 fL Final    Platelet 11/18/2021 412  150 - 450 10*9/L Final    Neutrophils % 11/18/2021 58.6  % Final    Lymphocytes % 11/18/2021 28.9  % Final    Monocytes % 11/18/2021 11.3  % Final    Eosinophils % 11/18/2021 0.7  % Final    Basophils % 11/18/2021 0.5  % Final    Absolute Neutrophils 11/18/2021 1.7 (L)  1.8 - 7.8 10*9/L Final    Absolute Lymphocytes 11/18/2021 0.8 (L)  1.1 - 3.6 10*9/L Final    Absolute Monocytes 11/18/2021 0.3  0.3 - 0.8 10*9/L Final    Absolute Eosinophils 11/18/2021 0.0  0.0 - 0.5 10*9/L Final    Absolute Basophils 11/18/2021 0.0  0.0 - 0.1 10*9/L Final    Smear Review Comments 11/18/2021 See Comment (A)  Undefined Final    Myelocytes present.  [id number]    Toxic Granulation 11/18/2021 Present (A)  Not Present Final    Neutrophil Left Shift 11/18/2021 Present (A)  Not Present Final       I spent a total of 20 minutes in the care of this patient.     Lavonda Jumbo, AGNP-BC  [facility]

## 2021-11-18 NOTE — Unmapped (Signed)
Please leave dressing in place and keep it dry for 24 hrs before removing. You can resume normal activities tomorrow, but take things easy today.     You may take tylenol as needed for discomfort at the area where the biopsy was taken.   After receiving Ativan, please do not drive today.     If you have questions between 8am to 5 pm Monday through Friday please call [phone] and speak to the operator.      For emergencies, evenings or weekends, please call [phone] and ask for oncology fellow on call.     Reasons to call emergency line may include:   Fever of 100.5 or greater   Nausea and/or vomiting not relieved with nausea medicine   Diarrhea or constipation   Severe pain not relieved with usual pain regimen       Hospital Outpatient Visit on 11/18/2021   Component Date Value Ref Range Status    ABO Grouping 11/18/2021 O POS   Final    Antibody Screen 11/18/2021 NEG   Final    Total Bilirubin 11/18/2021 0.5  0.3 - 1.2 mg/dL Final    WBC 57/84/6962 2.9 (L)  3.6 - 11.2 10*9/L Final    RBC 11/18/2021 2.85 (L)  3.95 - 5.13 10*12/L Final    HGB 11/18/2021 9.1 (L)  11.3 - 14.9 g/dL Final    HCT 95/28/4132 25.2 (L)  34.0 - 44.0 % Final    MCV 11/18/2021 88.3  77.6 - 95.7 fL Final    MCH 11/18/2021 31.9  25.9 - 32.4 pg Final    MCHC 11/18/2021 36.1 (H)  32.0 - 36.0 g/dL Final    RDW 44/08/270 13.8  12.2 - 15.2 % Final    MPV 11/18/2021 6.3 (L)  6.8 - 10.7 fL Final    Platelet 11/18/2021 412  150 - 450 10*9/L Final    Neutrophils % 11/18/2021 58.6  % Final    Lymphocytes % 11/18/2021 28.9  % Final    Monocytes % 11/18/2021 11.3  % Final    Eosinophils % 11/18/2021 0.7  % Final    Basophils % 11/18/2021 0.5  % Final    Absolute Neutrophils 11/18/2021 1.7 (L)  1.8 - 7.8 10*9/L Final    Absolute Lymphocytes 11/18/2021 0.8 (L)  1.1 - 3.6 10*9/L Final    Absolute Monocytes 11/18/2021 0.3  0.3 - 0.8 10*9/L Final    Absolute Eosinophils 11/18/2021 0.0  0.0 - 0.5 10*9/L Final    Absolute Basophils 11/18/2021 0.0  0.0 - 0.1 10*9/L Final    Smear Review Comments 11/18/2021 See Comment (A)  Undefined Final    Myelocytes present.  [id number]    Toxic Granulation 11/18/2021 Present (A)  Not Present Final    Neutrophil Left Shift 11/18/2021 Present (A)  Not Present Final

## 2021-11-18 NOTE — Unmapped (Signed)
Lumbar Puncture Procedure Note    Today's Date: 11/18/2021    Diagnosis:   Patient Active Problem List   Diagnosis    Leukocytosis    B-cell acute lymphoblastic leukemia (ALL) (CMS-HCC)       Indications: PH+ B-ALL    Ordering Physician: Dr. [name]    Premedication:    Clinician(s) Performing Procedure:  Ivor Costa. Thera Flake, PA-C    Procedure Details     Consent: The risks, benefits, and alternatives to the procedure were discussed. All questions were answered. Consent was obtained and witnessed.    A time-out in which Her patient identifiers were checked by 2 providers was performed.    The patient was positioned under sterile conditions. Betadine solution and sterile drapes were utilized. Local anesthesia with 1% lidocaine was applied subcutaneously then deep to the skin. A spinal needle was inserted at the L3 - L4 interspace. Spinal fluid was obtained and sent to the laboratory.    Intrathecal MTX was administered over 4 min.    The spinal needle with trocar was removed with minimal bleeding noted upon removal. A sterile bandage was placed over the puncture site after holding pressure.        Findings  6mL of clear spinal fluid was obtained.    Complications:  None; patient tolerated the procedure well.          Condition: stable    Plan  Bed rest for 1 hours.  Tylenol 650 mg for pain.  Call office if you develop a severe headache, nausea, vomiting, or fever greater than 100.5 F.

## 2021-11-18 NOTE — Unmapped (Signed)
Patient arrive to chair 10 for LP w IT Chemo to be given by sterlina. PIV flushed and removed after ordered medications given. Patient discharge to home after 1 hour post observation, no concerns upon departure.

## 2021-11-18 NOTE — Unmapped (Signed)
Patient came to infusion to receive Vincristine and IT methotreaxate. VS were WNP and a PIV was placed. Labs were drawn and sent to lab for analysis. Lab results were above parameters for transfusion. A request was sent top pharmacy for her chemotherapy. She c/o pain in her left and had an emergent visit with the APP to address concerns for DVT. They also discussed allergy symptoms with nagging cough. She was given Vincristine via gravity for PIV without incident and discharged to lobby until her LP appointment time.

## 2021-11-18 NOTE — Unmapped (Signed)
Hospital Outpatient Visit on 11/18/2021   Component Date Value Ref Range Status    ABO Grouping 11/18/2021 O POS   Final    Antibody Screen 11/18/2021 NEG   Final    Total Bilirubin 11/18/2021 0.5  0.3 - 1.2 mg/dL Final    WBC 30/86/5784 2.9 (L)  3.6 - 11.2 10*9/L Final    RBC 11/18/2021 2.85 (L)  3.95 - 5.13 10*12/L Final    HGB 11/18/2021 9.1 (L)  11.3 - 14.9 g/dL Final    HCT 69/62/9528 25.2 (L)  34.0 - 44.0 % Final    MCV 11/18/2021 88.3  77.6 - 95.7 fL Final    MCH 11/18/2021 31.9  25.9 - 32.4 pg Final    MCHC 11/18/2021 36.1 (H)  32.0 - 36.0 g/dL Final    RDW 41/32/4401 13.8  12.2 - 15.2 % Final    MPV 11/18/2021 6.3 (L)  6.8 - 10.7 fL Final    Platelet 11/18/2021 412  150 - 450 10*9/L Final    Neutrophils % 11/18/2021 58.6  % Final    Lymphocytes % 11/18/2021 28.9  % Final    Monocytes % 11/18/2021 11.3  % Final    Eosinophils % 11/18/2021 0.7  % Final    Basophils % 11/18/2021 0.5  % Final    Absolute Neutrophils 11/18/2021 1.7 (L)  1.8 - 7.8 10*9/L Final    Absolute Lymphocytes 11/18/2021 0.8 (L)  1.1 - 3.6 10*9/L Final    Absolute Monocytes 11/18/2021 0.3  0.3 - 0.8 10*9/L Final    Absolute Eosinophils 11/18/2021 0.0  0.0 - 0.5 10*9/L Final    Absolute Basophils 11/18/2021 0.0  0.0 - 0.1 10*9/L Final    Smear Review Comments 11/18/2021 See Comment (A)  Undefined Final    Myelocytes present.  [id number]    Toxic Granulation 11/18/2021 Present (A)  Not Present Final    Neutrophil Left Shift 11/18/2021 Present (A)  Not Present Final

## 2021-11-22 DIAGNOSIS — C9100 Acute lymphoblastic leukemia not having achieved remission: Principal | ICD-10-CM

## 2021-11-23 ENCOUNTER — Encounter: Admit: 2021-11-23 | Discharge: 2021-11-23 | Payer: PRIVATE HEALTH INSURANCE

## 2021-11-23 ENCOUNTER — Ambulatory Visit: Admit: 2021-11-23 | Discharge: 2021-11-23 | Payer: PRIVATE HEALTH INSURANCE

## 2021-11-23 ENCOUNTER — Other Ambulatory Visit: Admit: 2021-11-23 | Discharge: 2021-11-23 | Payer: PRIVATE HEALTH INSURANCE

## 2021-11-23 ENCOUNTER — Ambulatory Visit
Admit: 2021-11-23 | Discharge: 2021-11-23 | Payer: PRIVATE HEALTH INSURANCE | Attending: Adult Health | Primary: Adult Health

## 2021-11-23 DIAGNOSIS — C9100 Acute lymphoblastic leukemia not having achieved remission: Principal | ICD-10-CM

## 2021-11-23 LAB — COMPREHENSIVE METABOLIC PANEL
ALBUMIN: 3.6 g/dL (ref 3.4–5.0)
ALKALINE PHOSPHATASE: 95 U/L (ref 46–116)
ALT (SGPT): 46 U/L (ref 10–49)
ANION GAP: 7 mmol/L (ref 5–14)
AST (SGOT): 23 U/L (ref <=34)
BILIRUBIN TOTAL: 0.5 mg/dL (ref 0.3–1.2)
BLOOD UREA NITROGEN: 14 mg/dL (ref 9–23)
BUN / CREAT RATIO: 21
CALCIUM: 8.8 mg/dL (ref 8.7–10.4)
CHLORIDE: 108 mmol/L — ABNORMAL HIGH (ref 98–107)
CO2: 27 mmol/L (ref 20.0–31.0)
CREATININE: 0.67 mg/dL
EGFR CKD-EPI (2021) FEMALE: >90 mL/min/{1.73_m2} (ref >=60)
GLUCOSE RANDOM: 108 mg/dL (ref 70–179)
POTASSIUM: 3.7 mmol/L (ref 3.4–4.8)
PROTEIN TOTAL: 6.8 g/dL (ref 5.7–8.2)
SODIUM: 142 mmol/L (ref 135–145)

## 2021-11-23 LAB — CBC W/ AUTO DIFF
BASOPHILS ABSOLUTE COUNT: 0 10*9/L (ref 0.0–0.1)
BASOPHILS RELATIVE PERCENT: 0.5 %
EOSINOPHILS ABSOLUTE COUNT: 0 10*9/L (ref 0.0–0.5)
EOSINOPHILS RELATIVE PERCENT: 0.1 %
HEMATOCRIT: 25.8 % — ABNORMAL LOW (ref 34.0–44.0)
HEMOGLOBIN: 9.2 g/dL — ABNORMAL LOW (ref 11.3–14.9)
LYMPHOCYTES ABSOLUTE COUNT: 0.8 10*9/L — ABNORMAL LOW (ref 1.1–3.6)
LYMPHOCYTES RELATIVE PERCENT: 15.6 %
MEAN CORPUSCULAR HEMOGLOBIN CONC: 35.8 g/dL (ref 32.0–36.0)
MEAN CORPUSCULAR HEMOGLOBIN: 32.1 pg (ref 25.9–32.4)
MEAN CORPUSCULAR VOLUME: 89.6 fL (ref 77.6–95.7)
MEAN PLATELET VOLUME: 6 fL — ABNORMAL LOW (ref 6.8–10.7)
MONOCYTES ABSOLUTE COUNT: 0.5 10*9/L (ref 0.3–0.8)
MONOCYTES RELATIVE PERCENT: 9.8 %
NEUTROPHILS ABSOLUTE COUNT: 3.6 10*9/L (ref 1.8–7.8)
NEUTROPHILS RELATIVE PERCENT: 74 %
PLATELET COUNT: 393 10*9/L (ref 150–450)
RED BLOOD CELL COUNT: 2.88 10*12/L — ABNORMAL LOW (ref 3.95–5.13)
RED CELL DISTRIBUTION WIDTH: 14.5 % (ref 12.2–15.2)
WBC ADJUSTED: 4.9 10*9/L (ref 3.6–11.2)

## 2021-11-23 NOTE — Unmapped (Signed)
You are doing well and your cell counts are recovering!    I will cancel your transfusion visit and the upcoming transfusion visits.    Focus on slowly increasing your activity level. Keep hydrated and focus on protein and calories in your diet.    I will talk with our pharmacy team about menses suppression for you.    Results for orders placed or performed in visit on 11/23/21   Comprehensive Metabolic Panel   Result Value Ref Range    Sodium 142 135 - 145 mmol/L    Potassium 3.7 3.4 - 4.8 mmol/L    Chloride 108 (H) 98 - 107 mmol/L    CO2 27.0 20.0 - 31.0 mmol/L    Anion Gap 7 5 - 14 mmol/L    BUN 14 9 - 23 mg/dL    Creatinine 0.98 1.19 - 0.80 mg/dL    BUN/Creatinine Ratio 21     eGFR CKD-EPI (2021) Female >90 >=60 mL/min/1.75m2    Glucose 108 70 - 179 mg/dL    Calcium 8.8 8.7 - 14.7 mg/dL    Albumin 3.6 3.4 - 5.0 g/dL    Total Protein 6.8 5.7 - 8.2 g/dL    Total Bilirubin 0.5 0.3 - 1.2 mg/dL    AST 23 <=82 U/L    ALT 46 10 - 49 U/L    Alkaline Phosphatase 95 46 - 116 U/L   CBC w/ Differential   Result Value Ref Range    WBC 4.9 3.6 - 11.2 10*9/L    RBC 2.88 (L) 3.95 - 5.13 10*12/L    HGB 9.2 (L) 11.3 - 14.9 g/dL    HCT 95.6 (L) 21.3 - 44.0 %    MCV 89.6 77.6 - 95.7 fL    MCH 32.1 25.9 - 32.4 pg    MCHC 35.8 32.0 - 36.0 g/dL    RDW 08.6 57.8 - 46.9 %    MPV 6.0 (L) 6.8 - 10.7 fL    Platelet 393 150 - 450 10*9/L    Neutrophils % 74.0 %    Lymphocytes % 15.6 %    Monocytes % 9.8 %    Eosinophils % 0.1 %    Basophils % 0.5 %    Absolute Neutrophils 3.6 1.8 - 7.8 10*9/L    Absolute Lymphocytes 0.8 (L) 1.1 - 3.6 10*9/L    Absolute Monocytes 0.5 0.3 - 0.8 10*9/L    Absolute Eosinophils 0.0 0.0 - 0.5 10*9/L    Absolute Basophils 0.0 0.0 - 0.1 10*9/L

## 2021-11-23 NOTE — Unmapped (Signed)
[facility] Leukemia Clinic: Follow Up Visit    [name]  Patient Age: 46 y.o.  Encounter Date: 11/09/2021    Primary Care Provider:  [name], MD    Referring Physician:  Lenon Ahmadi    Reason for visit:  Transitions of care visit      Assessment:   [name] is a  46 y.o. female with newly diagnosed Ph+ B-ALL, who is seen in clinic for follow up.      She is currently Day 25 of induction therapy based on GRAAPH-2005. Her counts are recovering and she is feeling better in the past week. 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 Pain is currently well controlled. She is currently having her period but it is normal for her. We started discussion about long-term menses suppression and she reports having not tolerated estrogen or progestin containing OCPs. We will continue this conversation at the next visit      ................    Plan and Recommendations:  Ph+ ALL  - Continue dasatinib 140 mg daily. Plan to adjust to 70 mg daily starting in C2.  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 She reports she will call them next week to arrange a visit    Nausea  - PRN constipation    Headaches  - PRN Esgisic      Supportive Care Recommendations:  We recommend based on the patient???s underlying diagnosis and treatment history the following supportive care:    1. Antimicrobial prophylaxis:  ALL (not in remission): Bacterial: Levofloxacin 500mg  PO daily (D1 through absolute neutrophils >/= 0.5);   Fungal: Fluconazole 400mg  PO daily (Initiate when ANC < 500 continue through absolute neutrophils >/= 0.5);   Viral: Valacyclovir 500mg  PO daily (D1, continuous)    2. Blood product support:  Leukoreduced blood products are required.  Irradiated blood products are preferred, but in case of urgent transfusion needs non-irradiated blood products may be used:     -  RBC transfusion threshold: transfuse 2 units for Hgb < 8 g/dL.  -  Platelet transfusion threshold: transfuse 1 unit of platelets for platelet count < 10, or for bleeding or need for invasive procedure.    Based on the patient's disease status and intensity of therapy, complete blood count with differential should be evaluated 1 times per week and used to guide transfusion support    3. Hematopoietic growth factor support: none      Dr. Senaida Ores was available    [name], AGPCNP-BC  Nurse Practitioner  Hematology/Oncology  [facility]      History of Present Illness:  We had the pleasure of seeing Patricia [name] in the Leukemia Clinic at the [place] [facility] [place] on 11/09/2021.  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 female with Ph+ B-ALL.      Oncologic history is as follows:    Oncology History   B-cell acute lymphoblastic leukemia (ALL) (CMS-HCC)   10/29/2021 Initial Diagnosis    B-cell acute lymphoblastic leukemia (ALL) (CMS-HCC)       10/30/2021 -  Chemotherapy  IP/OP LEUKEMIA GRAAPH-2005 < 60 YO (OP PEGFILGRASTIM ON DAY 7)  [No description for this plan]             INTERVAL HISTORY:    Since her last visit she had a number of symptoms - GI upset, back pain, and sinus congestion. These symptoms have improved of late. She is feeling better and is getting more active. She feels weak but is getting up an walking about the house. She has a wedge pillow that has helped with the pain.  She has developed tingling in her finger tips, a little in her toes. This is not impacting function.  She started her period on 4/22 and it has been normal in flow and duration thus far.    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 30 tablet 2    dasatinib (SPRYCEL) 70 MG tablet Take 2 tablets (140 mg total) by mouth daily. 60 tablet 6    polyethylene glycol (GLYCOLAX) 17 gram/dose powder Take 1 packet (17 g) by mouth two (2) times a day as needed. Mix with 4-8 oz liquid and drink. 1020 g 0    valACYclovir (VALTREX) 500 MG tablet Take 1 tablet (500 mg total) by mouth daily. 90 tablet 3     No current facility-administered medications for this visit.         ALLERGIES:  Allergies   Allergen Reactions    Erythromycin Hives    Sulfa (Sulfonamide Antibiotics) Anaphylaxis    Azithromycin        SOCIAL HISTORY:  Social History     Social History Narrative    Not on file            FAMILY HISTORY:  No family history on file.    REVIEW OF SYSTEMS:  See HPI. A 10 system ROS is otherwise negative.     ECOG Performance Status: 1 - Restricted in physically strenuous activity but ambulatory and able to carry out work of a light or sedentary nature, e.g., light house work, office work    PHYSICAL EXAMINATION:  VS:   BP 108/83  - Pulse (S) 110  - Temp 36.9 ??C (98.4 ??F) (Temporal)  - Resp 16  - Ht 166.6 cm (5' 5.59)  - Wt 80.1 kg (176 lb 9.4 oz)  - SpO2 98%  - BMI 28.86 kg/m??     General: Resting in no apparent distress, accompanied by spouse  HEENT:  Clear sclera, conjunctiva, mask in place  CARDAC: RRR, no R,M,Gs, no pitting peripheral edema  RESP: nonlabored, bilaterally CTA  GI: Soft, nontender, active bowel sounds, no hepatic or splenomegaly  NEURO: alert, 0x4, steady gait, no focal deficits  PSYCH: appropriate  DERM: no visible rashes, lesions  LINE: none            LABORATORY DATA:  Results for orders placed or performed in visit on 11/23/21   Comprehensive Metabolic Panel   Result Value Ref Range    Sodium 142 135 - 145 mmol/L  Potassium 3.7 3.4 - 4.8 mmol/L    Chloride 108 (H) 98 - 107 mmol/L    CO2 27.0 20.0 - 31.0 mmol/L    Anion Gap 7 5 - 14 mmol/L    BUN 14 9 - 23 mg/dL    Creatinine 1.61 0.96 - 0.80 mg/dL    BUN/Creatinine Ratio 21     eGFR CKD-EPI (2021) Female >90 >=60 mL/min/1.9m2    Glucose 108 70 - 179 mg/dL    Calcium 8.8 8.7 - 04.5 mg/dL    Albumin 3.6 3.4 - 5.0 g/dL    Total Protein 6.8 5.7 - 8.2 g/dL    Total Bilirubin 0.5 0.3 - 1.2 mg/dL    AST 23 <=40 U/L    ALT 46 10 - 49 U/L    Alkaline Phosphatase 95 46 - 116 U/L   Type and Screen   Result Value Ref Range    ABO Grouping O POS     Antibody Screen NEG    CBC w/ Differential   Result Value Ref Range    WBC 4.9 3.6 - 11.2 10*9/L    RBC 2.88 (L) 3.95 - 5.13 10*12/L    HGB 9.2 (L) 11.3 - 14.9 g/dL    HCT 98.1 (L) 19.1 - 44.0 %    MCV 89.6 77.6 - 95.7 fL    MCH 32.1 25.9 - 32.4 pg    MCHC 35.8 32.0 - 36.0 g/dL    RDW 47.8 29.5 - 62.1 %    MPV 6.0 (L) 6.8 - 10.7 fL    Platelet 393 150 - 450 10*9/L    Neutrophils % 74.0 %    Lymphocytes % 15.6 %    Monocytes % 9.8 %    Eosinophils % 0.1 %    Basophils % 0.5 %    Absolute Neutrophils 3.6 1.8 - 7.8 10*9/L    Absolute Lymphocytes 0.8 (L) 1.1 - 3.6 10*9/L    Absolute Monocytes 0.5 0.3 - 0.8 10*9/L    Absolute Eosinophils 0.0 0.0 - 0.5 10*9/L    Absolute Basophils 0.0 0.0 - 0.1 10*9/L

## 2021-11-23 NOTE — Unmapped (Signed)
PIV placed.  Labs drawn & sent for analysis. To next appt.  Care provided by Shelly Wood RN.

## 2021-11-26 DIAGNOSIS — C9100 Acute lymphoblastic leukemia not having achieved remission: Principal | ICD-10-CM

## 2021-11-26 NOTE — Unmapped (Addendum)
[facility] Clinical Assessment & Refill Coordination Note    Patricia [name] [name], DOB: Nov 14, 1975  Phone: [phone] (home)     All above HIPAA information was verified with patient.     Was a Nurse, learning disability used for this call? No    Specialty Medication(s):   Hematology/Oncology: Sprycel 70mg      Current Outpatient Medications   Medication Sig Dispense Refill    cetirizine (ZYRTEC) 10 MG tablet Take 1 tablet (10 mg total) by mouth nightly. 30 tablet 2    dasatinib (SPRYCEL) 70 MG tablet Take 2 tablets (140 mg total) by mouth daily. 60 tablet 6    polyethylene glycol (GLYCOLAX) 17 gram/dose powder Take 1 packet (17 g) by mouth two (2) times a day as needed. Mix with 4-8 oz liquid and drink. 1020 g 0    valACYclovir (VALTREX) 500 MG tablet Take 1 tablet (500 mg total) by mouth daily. 90 tablet 3     No current facility-administered medications for this visit.        Changes to medications: Patricia [name] [name].    Allergies   Allergen Reactions    Erythromycin Hives    Sulfa (Sulfonamide Antibiotics) Anaphylaxis    Azithromycin        Changes to allergies: No    SPECIALTY MEDICATION ADHERENCE     Sprycel 70 mg: 9 days of medicine on hand     Specialty medication(s) dose(s) confirmed: Regimen is correct and unchanged.     Are there any concerns with adherence? No    Adherence counseling provided? Not needed    CLINICAL MANAGEMENT AND INTERVENTION      Clinical Benefit Assessment:    Do you feel the medicine is effective or helping your condition? Yes    Clinical Benefit counseling provided? Not needed    Adverse Effects Assessment:    Are you experiencing any side effects? No    Are you experiencing difficulty administering your medicine? No    Quality of Life Assessment:    Quality of Life    Rheumatology  Oncology  1. What impact has your specialty medication had on the reduction of your daily pain or discomfort level?: None  2. On a scale of 1-10, how would you rate your ability to manage side effects associated with your specialty medication? (1=no issues, 10 = unable to take medication due to side effects): 1  Dermatology  Cystic Fibrosis          How many days over the past month did your leukemia  keep you from your normal activities? For example, brushing your teeth or getting up in the morning. Almost every day    Have you discussed this with your provider? Not needed    Acute Infection Status:    Acute infections noted within Epic:  No active infections  Patient reported infection: None    Therapy Appropriateness:    Is therapy appropriate and patient progressing towards therapeutic goals? Yes, therapy is appropriate and should be continued    DISEASE/MEDICATION-SPECIFIC INFORMATION      N/A    PATIENT SPECIFIC NEEDS     Does the patient have any physical, cognitive, or cultural barriers? No    Is the patient high risk? Yes, patient is taking oral chemotherapy. Appropriateness of therapy as been assessed    Does the patient require a Care Management Plan? No     SOCIAL DETERMINANTS OF HEALTH     At  the [facility], we have learned that life circumstances - like trouble affording food, housing, utilities, or transportation can affect the health of many of our patients.   That is why we wanted to ask: are you currently experiencing any life circumstances that are negatively impacting your health and/or quality of life? No    Social Determinants of Psychologist, prison and probation services Strain: Low Risk     Difficulty of Paying Living Expenses: Not hard at all   Internet Connectivity: Not on file   Food Insecurity: No Food Insecurity    Worried About Programme researcher, broadcasting/film/video in the Last Year: Never true    Barista in the Last Year: Never true   Tobacco Use: Low Risk     Smoking Tobacco Use: Never    Smokeless Tobacco Use: Never    Passive Exposure: Not on file   Housing/Utilities: Low Risk     Within the past 12 months, have you ever stayed: outside, in a car, in a tent, in an overnight shelter, or temporarily in someone else's home (i.e. couch-surfing)?: No    Are you worried about losing your housing?: No    Within the past 12 months, have you been unable to get utilities (heat, electricity) when it was really needed?: No   Alcohol Use: Not on file   Transportation Needs: No Transportation Needs    Lack of Transportation (Medical): No    Lack of Transportation (Non-Medical): No   Substance Use: Not on file   Health Literacy: Not on file   Physical Activity: Not on file   Interpersonal Safety: Not on file   Stress: Not on file   Intimate Partner Violence: Not on file   Depression: Not on file   Social Connections: Not on file       Would you be willing to receive help with any of the needs that you have identified today? Not applicable       SHIPPING     Specialty Medication(s) to be Shipped:   Hematology/Oncology: Sprycel 70mg     Other medication(s) to be shipped: No additional medications requested for fill at this [name]     Changes to insurance: No    Delivery Scheduled: Yes, Expected medication delivery date: 5/3.     Medication will be delivered via Next Day Courier to the confirmed prescription address in [facility].    The patient will receive a drug information handout for each medication shipped and additional FDA Medication Guides as required.  Verified that patient has previously received a Conservation officer, historic buildings and a Surveyor, mining.    The patient or caregiver noted above participated in the development of this care plan and knows that they can request review of or adjustments to the care plan at any [name].      All of the patient's questions and concerns have been addressed.    [name]   [facility] Specialty Pharmacist    I reviewed this patient case and all documentation provided by the learner and was readily available for consultation during their interaction with the patient.  I agree with the assessment and plan listed below.    Breck Coons [facility] Specialty Pharmacist

## 2021-11-26 NOTE — Unmapped (Signed)
[facility]   Patient Onboarding/Medication Counseling    Ms.Huot is a 46 y.o. female with B-cell acute lymphoblastic leukemia (ALL) who I am counseling today on continuation of therapy.  I am speaking to the patient.    Was a Nurse, learning disability used for this call? No    Verified patient's date of birth / HIPAA.    Specialty medication(s) to be sent: Hematology/Oncology: Sprycel 70mg     Non-specialty medications/supplies to be sent: none    Medications not needed at this time: none     Sprycel (dasatinib)    The patient declined counseling on medication administration, missed dose instructions, goals of therapy, side effects and monitoring parameters, warnings and precautions, drug/food interactions, and storage, handling precautions, and disposal because they have taken the medication previously. The information in the declined sections below are for informational purposes only and was not discussed with patient.     Medication & Administration   Dosage: Take 2 tablets (140 mg total) by mouth daily.    Administration:   Administer orally at the same time each day.   May be administered with or without food.  Take with food if GI upset occurs.    Swallow tablet whole; do not break, crush, or chew.  Avoid acid reducing medications unless directed to take by your oncology team.    No proton pump inhibitors  H2 blockers use only once daily and must be exactly 2 hours after dasatinib dose  Antacids (i.e. TUMS, Rolaids) must be separated by at least 2 hours before and 2 hours after dasatinib dose.    Adherence/Missed dose instructions: If you miss a dose of medication, skip the missed dose and go back to your normal time. Do not take 2 doses at the same time or extra doses.    Goals of Therapy     To prevent disease progression    Side Effects & Monitoring Parameters     Commonly reported side effects  Lack of appetite  Muscle pain, joint pain  Painful extremities   Constipation  Headache    The following side effects should be reported to the provider:  Signs of infection (fever >100.4, chills, mouth sores, sputum production)  Signs of low phosphate (vision changes, confusion, mood changes, muscle pain, muscle weakness, trouble breathing or trouble swallowing).  Signs of Steven-Johnson syndrome/toxic epidermal necrolysis (red, swollen, blistered or peeling skin (with or without fever), red or irritated eyes, or sores in the mouth, throat, nose or eyes.  Signs of tumor lysis syndrome (fast or abnormal heartbeat, any passing out, unable to pass urine, muscle weakness or cramps, nausea, vomiting, diarrhea or lack of appetite or feeling sluggish)  Shortness of breath, trouble breathing, swelling of arms or legs, weight gain  Chest pain, fast or abnormal heartbeat, passing out, severe dizziness  Signs of bleeding (vomiting or coughing up blood, blood that looks like coffee grounds, blood in the urine or black, red tarry stools, bruising that gets bigger without reason, any persistent or severe bleeding, impaired wound healing)  Severe abdominal pain, severe nausea, vomiting, severe diarrhea  Severe loss of energy and strength  Numbness or tingling of hands or feet  Seizure   Signs of anaphylaxis (wheezing, chest tightness, swelling of face, lips, tongue or throat)  Monitoring Parameters:   CBC with differential   Bone marrow biopsy; liver function tests, electrolytes including calcium, phosphorus, magnesium  Monitor for fluid retention  Monitor for signs/symptoms of cardiac dysfunction; ECG monitoring if at  risk for QTc prolongation; chest x-ray is recommended for symptoms suggestive of pleural effusion   Monitor signs/symptoms of tumor lysis syndrome and dermatologic reactions.   Monitor bone growth/development in pediatric patients.   Monitor blood pressure routinely  Monitor adherence.  Thyroid function testing recommendations  Preexisting levothyroxine therapy: Obtain baseline TSH levels, then monitor every 4 weeks until levels and levothyroxine dose are stable, then monitor every 2 months  Without preexisting thyroid hormone replacement: TSH at baseline, then monthly for 4 months, then every 2 to 3 months  Contraindications, Warnings, & Precautions     Bone marrow suppression: Severe dose-related bone marrow suppression (thrombocytopenia, neutropenia, anemia) is associated with dasatinib treatment. Hematologic toxicity is usually reversible with dosage adjustment and/or temporary treatment interruption. The incidence of myelosuppression is higher in patients with advanced chronic myeloid leukemia (CML) and Ph+ acute lymphoblastic leukemia (ALL).  Cardiovascular adverse events: Dasatinib may cause cardiac dysfunction; cardiac ischemic events, cardiac fluid retention-related events, and conduction abnormalities (arrhythmia and palpitations) have been reported.   Dermatologic toxicity: Cases of severe mucocutaneous dermatologic reactions (including Stevens-Johnson syndrome and erythema multiforme) have been reported with dasatinib.  Fluid retention: Dasatinib may cause fluid retention, including pleural and pericardial effusions, pulmonary hypertension, and generalized or superficial edema. A prompt chest x-ray (or other appropriate diagnostic imaging) is recommended for symptoms suggestive of effusion (new or worsening dyspnea on exertion or at rest, pleuritic chest pain, or dry cough). Fluid retention/pleural effusion was observed in adults and grade 1 or 2 fluid retention was observed in pediatric patients. Use with caution in patients where fluid accumulation may be poorly tolerated, such as in cardiovascular disease (HF or hypertension) and pulmonary disease.  Hemorrhage: Dasatinib may cause serious and fatal bleeding, including grades 3 and higher CNS hemorrhage. The most frequent hemorrhage site was gastrointestinal. Grades 3 or 4 hemorrhage usually required treatment interruptions and transfusions. Most bleeding events in clinical studies were associated with severe thrombocytopenia, although dasatinib may also cause platelet dysfunction. Concomitant medications that inhibit platelet function or anticoagulants may increase the risk of bleeding.  Pulmonary arterial hypertension: Dasatinib may increase the risk for pulmonary arterial hypertension (PAH) in both adult and pediatric patients. PAH may occur at any time after starting treatment, including after >12 months of therapy. Evaluate for underlying cardiopulmonary disease prior to therapy initiation and during therapy; evaluate and rule out alternative etiologies in patients with symptoms suggestive of PAH (eg, dyspnea, fatigue, hypoxia, fluid retention).  QT prolongation: Dasatinib may increase the risk for QT interval prolongation; there are reports of patients with QTcF >500 msec. Use caution in patients at risk for QT prolongation, including patients with long QT syndrome, patients taking antiarrhythmic medications or other medications that lead to QT prolongation or potassium-wasting diuretics, patients with cumulative high-dose anthracycline therapy, and conditions which cause hypokalemia or hypomagnesemia. Correct hypokalemia and hypomagnesemia prior to and during dasatinib therapy.  Tumor lysis syndrome: Tumor lysis syndrome (TLS) has been reported in patients with resistance to imatinib therapy, usually in patients with advanced phase disease. Risk for TLS is higher in patients with advanced stage disease and/or a high tumor burden; monitor patients at risk more frequently.  Hepatic impairment: Use with caution in patients with hepatic impairment due to extensive hepatic metabolism.  Elderly: Patients 60 years of age and older are more likely to experience toxicity (compared with younger patients).  Pediatric: Adverse reactions associated with bone growth and development have been reported in pediatric studies of chronic phase CML (including a report of  severe [grade 3] growth retardation). Cases have included epiphyses delayed fusion, osteopenia, growth retardation, and gynecomastia; some cases resolved during treatment. Monitor bone growth/development in pediatric patients.  Reproductive Considerations: Females of reproductive potential should use effective contraception during treatment and for 30 days after the final dasatinib dose.  Breast-Feeding Considerations: It is not known if dasatinib is present in breast milk. Due to the potential for serious adverse reactions in the breastfed infant, breastfeeding is not recommended by the manufacturer during treatment and for 2 weeks following the final dasatinib dose.  Drug/Food Interactions     Medication list reviewed in Epic. The patient was instructed to inform the care team before taking any new medications or supplements. No drug interactions identified.   Is the patient on any CYP3A4 inducers? No.  Is the patient on any CYP3A4 inhibitors? No.  Is the patient taking an anticoagulant? No.  Is the patient taking a medication that could interfere with platelet function? No.  Is the patient taking at QTc-prolonging medication? No.  Is the patient taking a PPI, H2 blocker or any medicine that affects gastric pH? No.  Avoid grapefruit and grapefruit juice as they can increase serum concentration of dasatinib.  Avoid St. John's Wort as it may decrease the serum concentration of dasatinib.  This medication may reduce vaccine efficacy. Do not receive any vaccines without consulting your doctor.    Storage, Handling Precautions, & Disposal   Store at room temperature in the original container (do not use a pillbox or store with other medications).   Caregivers helping administer medication should wear gloves and wash hands immediately after.    Keep the lid tightly closed. Keep out of the reach of children and pets.  Do not flush down a toilet or pour down a drain unless instructed to do so.  Check with your local police department or fire station about drug take-back programs in your area.        Current Medications (including OTC/herbals), Comorbidities and Allergies     Current Outpatient Medications   Medication Sig Dispense Refill    cetirizine (ZYRTEC) 10 MG tablet Take 1 tablet (10 mg total) by mouth nightly. 30 tablet 2    dasatinib (SPRYCEL) 70 MG tablet Take 2 tablets (140 mg total) by mouth daily. 60 tablet 6    polyethylene glycol (GLYCOLAX) 17 gram/dose powder Take 1 packet (17 g) by mouth two (2) times a day as needed. Mix with 4-8 oz liquid and drink. 1020 g 0    valACYclovir (VALTREX) 500 MG tablet Take 1 tablet (500 mg total) by mouth daily. 90 tablet 3     No current facility-administered medications for this visit.       Allergies   Allergen Reactions    Erythromycin Hives    Sulfa (Sulfonamide Antibiotics) Anaphylaxis    Azithromycin        Patient Active Problem List   Diagnosis    Leukocytosis    B-cell acute lymphoblastic leukemia (ALL) (CMS-HCC)    Acute cough    Dyspepsia       Reviewed and up to date in Epic.    Appropriateness of Therapy     Acute infections noted within Epic:  No active infections  Patient reported infection: None    Is medication and dose appropriate based on diagnosis and infection status? Yes    Prescription has been clinically reviewed: Yes      Baseline Quality of Life Assessment      How  many days over the past month did your ALL  keep you from your normal activities? For example, brushing your teeth or getting up in the morning. Patient declined to answer    Financial Information     Medication Assistance provided: Prior Authorization and Copay Assistance    Anticipated copay of $0 / 30 days reviewed with patient. Verified delivery address.    Delivery Information     Scheduled delivery date: 12/01/21    Expected start date: continuing on therapy    Medication will be delivered via Next Day Courier to the prescription address in [facility].  This shipment will not require a signature. Explained the services we provide at [facility] and that each month we would call to set up refills.  Stressed importance of returning phone calls so that we could ensure they receive their medications in time each month.  Informed patient that we should be setting up refills 7-10 days prior to when they will run out of medication.  A pharmacist will reach out to perform a clinical assessment periodically.  Informed patient that a welcome packet, containing information about our pharmacy and other support services, a Notice of Privacy Practices, and a drug information handout will be sent.      The patient or caregiver noted above participated in the development of this care plan and knows that they can request review of or adjustments to the care plan at any time.      Patient or caregiver verbalized understanding of the above information as well as how to contact the pharmacy at [phone] option 4 with any questions/concerns.  The pharmacy is open Monday through Friday 8:30am-4:30pm.  A pharmacist is available 24/7 via pager to answer any clinical questions they may have.    Patient Specific Needs     Does the patient have any physical, cognitive, or cultural barriers? No    Does the patient have adequate living arrangements? (i.e. the ability to store and take their medication appropriately) Yes    Did you identify any home environmental safety or security hazards? No    Patient prefers to have medications discussed with  Patient     Is the patient or caregiver able to read and understand education materials at a high school level or above? Yes    Patient's primary language is  English     Is the patient high risk? Yes, patient is taking oral chemotherapy. Appropriateness of therapy as been assessed    SOCIAL DETERMINANTS OF HEALTH     At the [facility], we have learned that life circumstances - like trouble affording food, housing, utilities, or transportation can affect the health of many of our patients.   That is why we wanted to ask: are you currently experiencing any life circumstances that are negatively impacting your health and/or quality of life? No    Social Determinants of Psychologist, prison and probation services Strain: Low Risk     Difficulty of Paying Living Expenses: Not hard at all   Internet Connectivity: Not on file   Food Insecurity: No Food Insecurity    Worried About Programme researcher, broadcasting/film/video in the Last Year: Never true    Ran Out of Food in the Last Year: Never true   Tobacco Use: Low Risk     Smoking Tobacco Use: Never    Smokeless Tobacco Use: Never    Passive Exposure: Not on file   Housing/Utilities: Low Risk  Within the past 12 months, have you ever stayed: outside, in a car, in a tent, in an overnight shelter, or temporarily in someone else's home (i.e. couch-surfing)?: No    Are you worried about losing your housing?: No    Within the past 12 months, have you been unable to get utilities (heat, electricity) when it was really needed?: No   Alcohol Use: Not on file   Transportation Needs: No Transportation Needs    Lack of Transportation (Medical): No    Lack of Transportation (Non-Medical): No   Substance Use: Not on file   Health Literacy: Not on file   Physical Activity: Not on file   Interpersonal Safety: Not on file   Stress: Not on file   Intimate Partner Violence: Not on file   Depression: Not on file   Social Connections: Not on file       Would you be willing to receive help with any of the needs that you have identified today? Not applicable       Geoge Lawrance A Shari Heritage [facility] Specialty Pharmacist

## 2021-11-26 NOTE — Unmapped (Signed)
Hello,    Please scheduled patient Patricia [name] on 12/10/2021 for a SCHED ADM THROUGH INF at  11:00 AM     ?? Reason for Admission: Scheduled chemotherapy HyperCVaD  ?? Primary Diagnosis:  Encounter for antineoplastic chemotherapy for ALL  ?? Primary Diagnosis ICD-10 Code:  Z51.11 & C91.00  ?? Expected length of stay: 3-5 Days   ?? CPT Code:  29562   ?? CPT Code Description: Under Injection and Intravenous Infusion Chemotherapy and Other Highly Complex Drug or Highly Complex Biologic Agent Administration   ?? Treating Attending: Dr. Thayer Ohm Dittus  ?? Need for PICC placement in Infusion? No    Infusion Scheduling: Please notify the patient of the appointment date and time once scheduled. Please document this information within this encounter and then route to the navigator prior to closing the encounter.       Scheduled Admissions Assessment        Assessment  Response  Intervention    Type of insurance  Private/ACA/Employer  Financial counselor referral is not warranted this time.     Reliable Trasportation  Yes Referral for Social Work is not warranted this time.   Central Line Access  Yes Patient has line access and does not require an intervention at this time       Thank you,  Frederik Pear

## 2021-11-29 ENCOUNTER — Other Ambulatory Visit: Admit: 2021-11-29 | Discharge: 2021-11-30 | Payer: PRIVATE HEALTH INSURANCE

## 2021-11-29 ENCOUNTER — Ambulatory Visit: Admit: 2021-11-29 | Discharge: 2021-11-30 | Payer: PRIVATE HEALTH INSURANCE

## 2021-11-29 ENCOUNTER — Encounter
Admit: 2021-11-29 | Discharge: 2021-11-30 | Payer: PRIVATE HEALTH INSURANCE | Attending: Adult Health | Primary: Adult Health

## 2021-11-29 ENCOUNTER — Ambulatory Visit
Admit: 2021-11-29 | Discharge: 2021-11-30 | Payer: PRIVATE HEALTH INSURANCE | Attending: Adult Health | Primary: Adult Health

## 2021-11-29 DIAGNOSIS — C9100 Acute lymphoblastic leukemia not having achieved remission: Principal | ICD-10-CM

## 2021-11-29 LAB — CBC W/ AUTO DIFF
BASOPHILS ABSOLUTE COUNT: 0.1 10*9/L (ref 0.0–0.1)
BASOPHILS RELATIVE PERCENT: 0.7 %
EOSINOPHILS ABSOLUTE COUNT: 0 10*9/L (ref 0.0–0.5)
EOSINOPHILS RELATIVE PERCENT: 0.2 %
HEMATOCRIT: 28 % — ABNORMAL LOW (ref 34.0–44.0)
HEMOGLOBIN: 9.7 g/dL — ABNORMAL LOW (ref 11.3–14.9)
LYMPHOCYTES ABSOLUTE COUNT: 2.6 10*9/L (ref 1.1–3.6)
LYMPHOCYTES RELATIVE PERCENT: 33 %
MEAN CORPUSCULAR HEMOGLOBIN CONC: 34.4 g/dL (ref 32.0–36.0)
MEAN CORPUSCULAR HEMOGLOBIN: 31.7 pg (ref 25.9–32.4)
MEAN CORPUSCULAR VOLUME: 92.3 fL (ref 77.6–95.7)
MEAN PLATELET VOLUME: 5.9 fL — ABNORMAL LOW (ref 6.8–10.7)
MONOCYTES ABSOLUTE COUNT: 1.2 10*9/L — ABNORMAL HIGH (ref 0.3–0.8)
MONOCYTES RELATIVE PERCENT: 14.9 %
NEUTROPHILS ABSOLUTE COUNT: 4 10*9/L (ref 1.8–7.8)
NEUTROPHILS RELATIVE PERCENT: 51.2 %
PLATELET COUNT: 154 10*9/L (ref 150–450)
RED BLOOD CELL COUNT: 3.04 10*12/L — ABNORMAL LOW (ref 3.95–5.13)
RED CELL DISTRIBUTION WIDTH: 17 % — ABNORMAL HIGH (ref 12.2–15.2)
WBC ADJUSTED: 7.9 10*9/L (ref 3.6–11.2)

## 2021-11-29 LAB — SMEAR - BONE MARROW PATIENT

## 2021-11-29 LAB — SLIDE REVIEW

## 2021-11-29 MED ORDER — AMOXICILLIN 875 MG-POTASSIUM CLAVULANATE 125 MG TABLET
ORAL_TABLET | Freq: Two times a day (BID) | ORAL | 0 refills | 7.00000 days | Status: CP
Start: 2021-11-29 — End: 2021-12-06

## 2021-11-29 MED ADMIN — LORazepam (ATIVAN) injection 1 mg: 1 mg | INTRAVENOUS | @ 17:00:00 | Stop: 2021-11-29

## 2021-11-29 NOTE — Unmapped (Signed)
Infusion Center Progress Note    [name]  Patient Age: 46 y.o.  Encounter Date: 11/29/2021    Reason for visit  Chief Complaint: sinus pressure and congestion      Assessment/Plan:     Frontal sinus pressure with history of acute sinus infections-  --woke up with dizziness with abrupt movement this AM  --Frontal sinus pressure and pain on palpation  --Headache, intermittent  --Cough over the last 3-4 days has resolved with zyrtec  --Cannot take intranasal medications due to history of sinus infections with usage  --Gets sinus infections 4-5 times per year usually, last November 2022  --having low grade fevers ~23F intermittently  --RX augmentin 875-125mg  PO BID x 7 days to local pharmacy for acute sinusitis treatment  --instructed to continue to monitor temperature and reach out if worsening symptoms    Patricia [name] verbalizes understanding instructions provided. She has contact information for her primary team should she have questions or concerns after discharge from infusion.     Subjective/HPI:  Patricia [name] is a 46 y.o. 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     Patricia [name] reports she has chronic issues with her sinuses, and typically has 4-5 sinus infections per year. She states her last treated sinus infection was in November 2022. She indicates she started coughing mid last week and noticed some increased nasal congestions. She started zyrtec and her cough has since subsided. She denies SOB, wheezing, or chest pain. She states that she has a pressure in the front of her forehead. She also reports intermittent headaches. She reports a low grade fever intermittently over the last 2 days. She also indicates that when she woke up this morning she thinks she got up too fast and had dizziness on ambulation to the bathroom. Patricia [name] states that dizziness happens to her when she has a sinus infection. She denies nasal drainage or sputum at this time.    Review of Systems:  Review of Systems   Constitutional:  Negative for appetite change, chills, diaphoresis and fever.        2 days low grade temperature elevations. Tmax 99.59F   HENT:   Negative for nosebleeds, sore throat and trouble swallowing.         Nasal congestion without rhinorrhea but with sensation of post nasal drip   Respiratory:  Positive for cough. Negative for shortness of breath.         Cough for 3-4 days, resolved yesterday after starting OTC zyrtec   Cardiovascular: Negative.    Gastrointestinal: Negative.    Genitourinary: Negative.     Musculoskeletal: Negative.  Negative for gait problem.   Skin: Negative.    Neurological:  Positive for dizziness and headaches. Negative for extremity weakness and gait problem.        Single occurrence of dizziness on waking this AM. Has since resolved. Having intermittent frontal headaches and pressure   Hematological: Negative.    Psychiatric/Behavioral: Negative.       Oncology History:  Oncology History   B-cell acute lymphoblastic leukemia (ALL) (CMS-HCC)   10/29/2021 Initial Diagnosis    B-cell acute lymphoblastic leukemia (ALL) (CMS-HCC)       10/30/2021 -  Chemotherapy    IP/OP LEUKEMIA GRAAPH-2005 < 60 YO (OP PEGFILGRASTIM ON DAY 7)  [No description for this plan]  Allergies:  Allergies   Allergen Reactions    Erythromycin Hives    Sulfa (Sulfonamide Antibiotics) Anaphylaxis    Azithromycin        Medications:  Reviewed in the EMR    Physical Exam  Vitals reviewed.   Constitutional:       General: She is awake. She is not in acute distress.     Appearance: Normal appearance. She is not ill-appearing.   Cardiovascular:      Rate and Rhythm: Normal rate and regular rhythm.      Heart sounds: Normal heart sounds. No murmur heard.    No friction rub. No gallop.   Pulmonary:      Effort: Pulmonary effort is normal.      Breath sounds: Normal breath sounds. No decreased breath sounds, wheezing, rhonchi or rales.   Skin:     General: Skin is warm and dry.   Neurological:      General: No focal deficit present.      Mental Status: She is alert and oriented to person, place, and time.      Motor: Motor function is intact.      Gait: Gait is intact.   Psychiatric:         Attention and Perception: Attention normal.         Mood and Affect: Mood normal.         Speech: Speech normal.         Behavior: Behavior is cooperative.         Results:  Hospital Outpatient Visit on 11/29/2021   Component Date Value Ref Range Status    Cytogenetics Test, Other 11/29/2021 Collected   Final    MRD Value, Bone Marrow 11/29/2021    Preliminary    See Interpretation      Comments, MRD Bone Marrow 11/29/2021    Preliminary    The following antibodies were tested:      CD3, CD9, CD10, CD13 & CD33, CD 19, CD20, CD34, CD38, CD45, CD58, CD71.    Assay limit of detection is 0.01%    This test, utilizing analyte-specific reagents (ASR), was developed and its performance characteristics determined by the [facility].  It has not been cleared or approved by the U.S. Food and Drug Administration (FDA).  The FDA has determined that such clearance or approval is not necessary.  The test is used for clinical purposes.  It should not be regarded as investigational or for research.   The flow cytometry lab is CLIA certified and CAP accredited to perform high complexity testing and is approved by the [facility] to perform B-ALL MRD testing.      Collection 11/29/2021 COLLECTED   Final    Collection 11/29/2021 Collected   Final    Smear - Bone Marrow Patient 11/29/2021 Prepared slide and instrument printout routed to hemepath   Final   Lab on 11/29/2021   Component Date Value Ref Range Status    ABO Grouping 11/29/2021 O POS   Final    Antibody Screen 11/29/2021 NEG   Final    WBC 11/29/2021 7.9  3.6 - 11.2 10*9/L Final    RBC 11/29/2021 3.04 (L)  3.95 - 5.13 10*12/L Final    HGB 11/29/2021 9.7 (L)  11.3 - 14.9 g/dL Final    HCT 57/84/6962 28.0 (L)  34.0 - 44.0 % Final    MCV 11/29/2021 92.3  77.6 - 95.7 fL Final    [facility] 11/29/2021  31.7  25.9 - 32.4 pg Final    MCHC 11/29/2021 34.4  32.0 - 36.0 g/dL Final    RDW 16/05/9603 17.0 (H)  12.2 - 15.2 % Final    MPV 11/29/2021 5.9 (L)  6.8 - 10.7 fL Final    Platelet 11/29/2021 154  150 - 450 10*9/L Final    Questionable result confirmed. Specimen integrity/identity confirmed.      Neutrophils % 11/29/2021 51.2  % Final    Lymphocytes % 11/29/2021 33.0  % Final    Monocytes % 11/29/2021 14.9  % Final    Eosinophils % 11/29/2021 0.2  % Final    Basophils % 11/29/2021 0.7  % Final    Absolute Neutrophils 11/29/2021 4.0  1.8 - 7.8 10*9/L Final    Absolute Lymphocytes 11/29/2021 2.6  1.1 - 3.6 10*9/L Final    Absolute Monocytes 11/29/2021 1.2 (H)  0.3 - 0.8 10*9/L Final    Absolute Eosinophils 11/29/2021 0.0  0.0 - 0.5 10*9/L Final    Absolute Basophils 11/29/2021 0.1  0.0 - 0.1 10*9/L Final    Anisocytosis 11/29/2021 Slight (A)  Not Present Final    Smear Review Comments 11/29/2021 See Comment (A)  Undefined Final    Slide [id number] reviewed    Basophilic Stippling 11/29/2021 Present (A)  Not Present Final       I spent a total of 20 minutes in the care of this patient.     Lavonda Jumbo, AGNP-BC  [facility]

## 2021-11-29 NOTE — Unmapped (Unsigned)
PIV placed.  Labs drawn & sent for analysis. To next appt.  Care provided by Daniel Maxwell RN.

## 2021-11-29 NOTE — Unmapped (Signed)
Err

## 2021-11-29 NOTE — Unmapped (Signed)
Please leave dressing in place and keep it dry for 24 hrs before removing. You can resume normal activities tomorrow, but take things easy today.     You may take tylenol as needed for discomfort at the area where the biopsy was taken.   After receiving Ativan, please do not drive today.     If you have questions between 8am to 5 pm Monday through Friday please call [phone] and speak to the operator.      For emergencies, evenings or weekends, please call [phone] and ask for oncology fellow on call.     Reasons to call emergency line may include:   Fever of 100.5 or greater   Nausea and/or vomiting not relieved with nausea medicine   Diarrhea or constipation   Severe pain not relieved with usual pain regimen       Lab on 11/29/2021   Component Date Value Ref Range Status    WBC 11/29/2021 7.9  3.6 - 11.2 10*9/L Preliminary    RBC 11/29/2021 3.04 (L)  3.95 - 5.13 10*12/L Preliminary    HGB 11/29/2021 9.7 (L)  11.3 - 14.9 g/dL Preliminary    HCT 29/56/2130 28.0 (L)  34.0 - 44.0 % Preliminary    MCV 11/29/2021 92.3  77.6 - 95.7 fL Preliminary    MCH 11/29/2021 31.7  25.9 - 32.4 pg Preliminary    MCHC 11/29/2021 34.4  32.0 - 36.0 g/dL Preliminary    RDW 86/57/8469 17.0 (H)  12.2 - 15.2 % Preliminary    MPV 11/29/2021    Preliminary    Pending.    Platelet 11/29/2021    Preliminary    Pending.    Anisocytosis 11/29/2021 Slight (A)  Not Present Preliminary

## 2021-11-29 NOTE — Unmapped (Signed)
Date of Service: 11/29/2021      Patient Active Problem List   Diagnosis    Leukocytosis    B-cell acute lymphoblastic leukemia (ALL) (CMS-HCC)    Acute cough    Dyspepsia       Indication:    Diagnosis ICD-10-CM Associated Orders   1. B-cell acute lymphoblastic leukemia (ALL) (CMS-HCC)  C91.00 Bone Marrow Biopsy & Aspiration     Bone Marrow Biopsy & Aspiration      2. 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 Rhyann Berton, PA-C        Bone Marrow Aspirate and Biopsy, right side    The risks, benefits, and alternatives to the procedure were discussed. All questions were answered. The patient verbalized understanding and signed informed consent. After a time-out in which his patient identifiers were checked by 2 providers, the patient was laid in prone position on the table.   The  posterior superior iliac spine and iliac crest were cleaned, prepped and draped in the usual sterile fashion.     Anesthetic agent used: 2% plain lidocaine.      Utilizing a Ranfac needle, a bone marrow aspiration and biopsy was performed.  Specimen was sent for routine histopathologic stains and sectioning, flow cytometry, cytogenetics, and molecular analysis.     A pressure dressing was applied to the biopsy site.  Patient tolerated the procedure well.  Hemostasis was confirmed upon discharge.     The patient was given verbal instructions for wound care, such as to keep the biopsy site dry, covered for 24 hours, and to call your physician for a temperature > 100.5.  Tylenol may be taken for discomfort.    Specimens Collected:  EDTA x 2  Heparin x 2  Core biopsy x 1

## 2021-11-29 NOTE — Unmapped (Unsigned)
Patient arrived for BMBx. Time out completed and consent verified. Meds given as ordered, driver for patient confirmed. Patient tolerated procedure well and was discharged by APP.

## 2021-12-01 NOTE — Unmapped (Signed)
[facility] Leukemia [facility] Initial Visit Note     [name]  Patient Age: 46 y.o.  Encounter Date: 12/02/2021    Primary Care Provider:  [name], MD    Referring Physician:  Attending Physician Inpatient, MD  [employer],  [place] [address]    Assessment/Plan:  Patricia [name] is a 46 y.o. female with past medical history of chronic right shoulder and back pain, hypertension, anxiety, and recently diagnosed Ph+ ALL who presents following induction on GRAAPH-2005.  Bone marrow biopsy following induction demonstrates morphologic CR.    Disease specific prognosis: It remains unclear whether or not Ph+ALL patients can be cured with chemotherapy alone plus TKI. R-Hyper-CVAD+dasatinib offers median DFS 31 mo and median OS 47 mo (Ravandi, Cancer 2015).     Prognosis is highly dependant upon achieving CMR. For patients who achieve CMR (neg by pcr) at 3 months, risk of relapse at 4 years is about 25% (Short, Blood 2016).     We spoke at length regarding the above diagnosis.  Specifically we talked about general treatment paradigms for ALL, chemotherapy plans, importance of intrathecal chemotherapy prophylaxis, treatment milestones, and likely side effects of the medications.  I also briefly mentioned the utility of a bone marrow transplant if CMR is not achieved.      We decided to continue to treat her in accordance with [facility] 2005 with high-dose dasatinib replaced for imatinib.        Ph+ALL, in remission: On GRAAPH 2005 (per Patricia [name], et al. Randomized study of reduced intensity chemotherapy combined with imatinib in adults with Ph-positive ALL. Blood 2015 Jun 11;125(24):3711-9) with dasatinib for imatinib. This is C1 high dose TKI+Dex+Vincristine to dose intensification with (R)-HyperCVAD/MA (starting with MA, I.e. Part B or Even cycles).  TKI is continued indefinitely with hopes for at least 2 years.  She has partial (51%) CD20 expression.  Therefore we will proceed with rituximab in addition to hyperCVAD.  - Line placement tentatively scheduled for 12/08/21  - Plan for R-HyperCVAD/MA+dasatinib - Starting with MA+R, admission on 12/10/21  - Dasatinib to 70 mg on admission.   - ITT planned for D7.   - Next bone marrow biopsy following cycle 3    Treatment Timeline:  10/29/2021: Bone marrow biopsy: Ph+ ALL, 51% expression of CD20  10/30/21: Cycle 1 day 1 GRAAPH-2005 induction  11/03/21: ITT #1   11/12/2021: ITT #2  11/18/2021: ITT #3  11/29/2021: Post cycle 1 bone marrow biopsy: Morphologic CR.  MRD by flow insufficient, p190 pending  12/10/2021: Planned admission for cycle 2 of GRAAPH-2005    Psychosocial distress: She reports a moderate level of anxiety regarding the management of the above.   - Counseling given   - Consider ref to comprehensive cancer support program     Patient-centered care/Shared decision-making:   We discussed the plan above at length. The patient and her husband actively contributed to the conversation. Specifically, her most important outcomes are:   Prolonging overall survival and Maintaining her overall functional activity     Supportive Care Needs: We recommend based on the patient???s underlying diagnosis and treatment history the following supportive care:    1. Antimicrobial prophylaxis:  ALL (in remission, receiving post-remission therapy): Bacterial: Levofloxacin 500mg  PO daily (when absolute neutrophils </= 0.5);   Fungal: Fluconazole 200mg  PO daily (when absolute neutrophils </= 0.5);   Viral: Valacyclovir 500mg  PO daily (Continuous);   PJP:  SMX/TMP DS PO BID twice weekly (Sat/Sun)  2. Blood product support:  Leukoreduced blood products are required.  Irradiated blood products are preferred, but in case of urgent transfusion needs non-irradiated blood products may be used: 2 units for Hg <= 7.0.     Coordination of care:   Admit for cycle 2  Follow-up prior to next cycle    Isa Rankin, MD  H/O PGY 6    Seen and discussed with:  Patricia Aloe, MD  Leukemia Program  Division of Hematology  [facility]      Nurse Navigator (non-clinical trial patients): Patricia Lamp, RN        Tel. [phone]       Fax. [phone]  Toll-free appointments: [phone]  Scheduling assistance: [phone]  After hours/weekends: [phone] (ask for adult hematology/oncology on-call)      History of Present Illness:   Patricia [name] is a 46 y.o. female with past medical history noted as above who presents following induction with GRAAPH-2005 for Ph+ ALL.  See hospital course for admission details.  In summary, she tolerated induction chemotherapy well without any significant complications.  She had grade 1 transaminitis which improved.  She has some visual floaters, and some acute on chronic back pain.  She is on oxycodone in addition to lidocaine patches and Voltaren gel.  She has longstanding anxiety and is on Lexapro every day in addition to Ativan 0.5 mg 3 times a day as needed which was started during the admission by palliative care.    Her social history is notable for being a Runner, broadcasting/film/video.  She was recently married. She lives with her husband.    At today's encounter she reports some mild fatigue and deconditioning. Prior to her diagnosis she had suffered a broken toe and so was not walking much which has exacerbated her deconditioning from recent hospitalization and induction chemotherapy. She is working to regain strength with exercise and does not require a cane/walker. Will occasionally use hand rail for stability up/down stairs. She had a recent BMBx and has some residual tenderness at the insertion site but it looks well healed without extensive bruising. She is using tylenol prn.    Otherwise, she denies new constitutional symptoms such as anorexia, weight loss, night sweats or unexplained fevers.  Furthermore, she denies symptoms of marrow failure: unexplained bleeding or bruising, recurrent or unexplained intercurrent infections, dyspnea on exertion, lightheadedness, palpitations or chest pain.  There have been no new or unexplained pains or self-identified masses, swelling or enlarged lymph nodes.    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 The majority of the abnormal cells (64/88) contained an additional BCR/ABL1 fusion signal, while 9/88 abnormal cells contained an additional ABL1 and ASS1 signal.       Genetics:   Karyotype/FISH:   RESULTS   Date Value Ref Range Status   10/28/2021   Final    NOTE: This report reflects a combined study from a peripheral blood and a bone marrow core biopsy. Eleven cells from the peripheral blood and nine  cells from the bone marrow core biopsy were analyzed. The BCR/ABL1 FISH analysis was performed on the peripheral blood.     Abnormal Karyotype:  47,XX,t(9;22)(q34;q11.2),+der(22)t(9;22)[18]/46,XX[2]    Abnormal FISH:  A BCR/ABL1 interphase FISH assay showed a signal pattern consistent with a BCR::ABL1 rearrangement and the 9;22 translocation in 88% of the 100 cells scored. The majority of the abnormal cells (64/88) contained an additional BCR/ABL1 fusion signal, while 9/88 abnormal cells contained an additional ABL1 and ASS1 signal.           Pertinent Phenotypic data:  CD19 98%  CD20 on diagnosis 51%  CD22 98%           B-cell acute lymphoblastic leukemia (ALL) (CMS-HCC)   10/29/2021 Initial Diagnosis    B-cell acute lymphoblastic leukemia (ALL) (CMS-HCC)       10/30/2021 -  Chemotherapy    IP/OP LEUKEMIA GRAAPH-2005 < 60 YO (OP PEGFILGRASTIM ON DAY 7)  [No description for this plan]           Review of Systems:   ROS reviewed and negative except as noted in H and P     Allergies:  Allergies   Allergen Reactions   ??? Erythromycin Hives   ??? Sulfa (Sulfonamide Antibiotics) Anaphylaxis   ??? Azithromycin        Medications:     Current Outpatient Medications:   ???  amoxicillin-clavulanate (AUGMENTIN) 875-125 mg per tablet, Take 1 tablet by mouth Two (2) times a day for 7 days., Disp: 14 tablet, Rfl: 0  ???  cetirizine (ZYRTEC) 10 MG tablet, Take 1 tablet (10 mg total) by mouth nightly., Disp: 30 tablet, Rfl: 2  ???  dasatinib (SPRYCEL) 70 MG tablet, Take 2 tablets (140 mg total) by mouth daily., Disp: 60 tablet, Rfl: 6  ???  polyethylene glycol (GLYCOLAX) 17 gram/dose powder, Take 1 packet (17 g) by mouth two (2) times a day as needed. Mix with 4-8 oz liquid and drink., Disp: 1020 g, Rfl: 0  ???  valACYclovir (VALTREX) 500 MG tablet, Take 1 tablet (500 mg total) by mouth daily., Disp: 90 tablet, Rfl: 3    Medical History:  Past Medical History:   Diagnosis Date   ??? 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       Social History:  Social History     Social History Narrative   ??? Not on file       Family History:  No family history on file.    Objective:   BP 117/81  - Pulse 94  - Temp 37.3 ??C (99.1 ??F) (Temporal)  - Resp 18  - Ht 170.2 cm (5' 7)  - Wt 81.2 kg (179 lb 0.2 oz)  - SpO2 99%  - BMI 28.04 kg/m??     Physical Exam:    General: WDWN, Sitting comfortably, NAD  HEENT:  PER. No scleral icterus or conjunctival injection.  Oral mucosa without ulceration, erythema, exudate or purpura.  Lymph node exam:  No lymphadenopathy in the anterior/posterior cervical, supraclavicular, axillary basins.  Heart:  Regular rate and rhythm. S1, S2. No murmurs, gallops or rubs.  Lungs:  Breathing is unlabored and patient is speaking full sentences with ease.  No stridor.  Auscultation of lung fields reveals normal air movement without rales, rhonchi or crackles.  GI:  No distention or pain on palpation. No palpable hepatomegaly or splenomegaly.  No palpable masses.  Skin:  No rashes, petechiae or purpura.  No areas of skin breakdown.  Musculoskeletal:  No grossly-evident joint effusions or deformities.   Psychiatric:  Alert and oriented to person, place, time and situation.  Range of affect is appropriate.    Neurologic:  Grossly normal motor and sensory.   Extremities:  Appear well-perfused. No clubbing, edema or cyanosis.    Test Results:  Recent Results (from the past 24 hour(s))   Comprehensive Metabolic Panel    Collection Time: 12/02/21 10:04 AM   Result Value Ref Range    Sodium 141 135 - 145 mmol/L    Potassium 3.8 3.4 - 4.8 mmol/L    Chloride 109 (H) 98 - 107 mmol/L    CO2 25.0 20.0 - 31.0 mmol/L    Anion Gap 7 5 - 14 mmol/L    BUN 13 9 - 23 mg/dL    Creatinine 1.61 0.96 - 0.80 mg/dL    BUN/Creatinine Ratio 21     eGFR CKD-EPI (2021) Female >90 >=60 mL/min/1.74m2    Glucose 136 70 - 179 mg/dL    Calcium 9.7 8.7 - 04.5 mg/dL    Albumin 3.7 3.4 - 5.0 g/dL    Total Protein 6.9 5.7 - 8.2 g/dL    Total Bilirubin 0.8 0.3 - 1.2 mg/dL    AST 36 (H) <=40 U/L    ALT 39 10 - 49 U/L    Alkaline Phosphatase 72 46 - 116 U/L   Lactate dehydrogenase    Collection Time: 12/02/21 10:04 AM   Result Value Ref Range    LDH 366 (H) 120 - 246 U/L   CBC w/ Differential    Collection Time: 12/02/21 10:04 AM   Result Value Ref Range    WBC 6.4 3.6 - 11.2 10*9/L    RBC 2.96 (L) 3.95 - 5.13 10*12/L    HGB 9.5 (L) 11.3 - 14.9 g/dL    HCT 98.1 (L) 19.1 - 44.0 %    MCV 92.9 77.6 - 95.7 fL    MCH 32.3 25.9 - 32.4 pg    MCHC 34.7 32.0 - 36.0 g/dL    RDW 47.8 (H) 29.5 - 15.2 %    MPV 6.4 (L) 6.8 - 10.7 fL    Platelet 194 150 - 450 10*9/L    Neutrophils % 64.2 %    Lymphocytes % 24.5 %    Monocytes % 10.1 %    Eosinophils % 0.6 %    Basophils % 0.6 %    Absolute Neutrophils 4.1 1.8 - 7.8 10*9/L    Absolute Lymphocytes 1.6 1.1 - 3.6 10*9/L    Absolute Monocytes 0.6 0.3 - 0.8 10*9/L    Absolute Eosinophils 0.0 0.0 - 0.5 10*9/L    Absolute Basophils 0.0 0.0 - 0.1 10*9/L    Anisocytosis Slight (A) Not Present     11/29/21 BMBx:  Diagnosis   Bone marrow, right iliac, aspiration and biopsy  -  Hypercellular bone marrow (70%) with trilineage hematopoiesis and 1% blasts by manual aspirate differential  -   Flow cytometry MRD analysis cannot definitively exclude the possibility of residual disease (see Comment and separate report)    -  See linked reports for associated Ancillary Studies.        10/29/21 10:52 11/29/21 13:00   BCR/ABL1 p190 Transcripts/100,000 Cells 27,301 2

## 2021-12-02 ENCOUNTER — Other Ambulatory Visit: Admit: 2021-12-02 | Discharge: 2021-12-02 | Payer: PRIVATE HEALTH INSURANCE

## 2021-12-02 ENCOUNTER — Ambulatory Visit
Admit: 2021-12-02 | Discharge: 2021-12-02 | Payer: PRIVATE HEALTH INSURANCE | Attending: Hematology | Primary: Hematology

## 2021-12-02 ENCOUNTER — Ambulatory Visit: Admit: 2021-12-02 | Discharge: 2021-12-02 | Payer: PRIVATE HEALTH INSURANCE

## 2021-12-02 ENCOUNTER — Ambulatory Visit
Admit: 2021-12-02 | Discharge: 2021-12-02 | Payer: PRIVATE HEALTH INSURANCE | Attending: Registered" | Primary: Registered"

## 2021-12-02 DIAGNOSIS — C9100 Acute lymphoblastic leukemia not having achieved remission: Principal | ICD-10-CM

## 2021-12-02 DIAGNOSIS — D72829 Elevated white blood cell count, unspecified: Principal | ICD-10-CM

## 2021-12-02 LAB — COMPREHENSIVE METABOLIC PANEL
ALBUMIN: 3.7 g/dL (ref 3.4–5.0)
ALKALINE PHOSPHATASE: 72 U/L (ref 46–116)
ALT (SGPT): 39 U/L (ref 10–49)
ANION GAP: 7 mmol/L (ref 5–14)
AST (SGOT): 36 U/L — ABNORMAL HIGH (ref <=34)
BILIRUBIN TOTAL: 0.8 mg/dL (ref 0.3–1.2)
BLOOD UREA NITROGEN: 13 mg/dL (ref 9–23)
BUN / CREAT RATIO: 21
CALCIUM: 9.7 mg/dL (ref 8.7–10.4)
CHLORIDE: 109 mmol/L — ABNORMAL HIGH (ref 98–107)
CO2: 25 mmol/L (ref 20.0–31.0)
CREATININE: 0.63 mg/dL
EGFR CKD-EPI (2021) FEMALE: >90 mL/min/{1.73_m2} (ref >=60)
GLUCOSE RANDOM: 136 mg/dL (ref 70–179)
POTASSIUM: 3.8 mmol/L (ref 3.4–4.8)
PROTEIN TOTAL: 6.9 g/dL (ref 5.7–8.2)
SODIUM: 141 mmol/L (ref 135–145)

## 2021-12-02 LAB — CBC W/ AUTO DIFF
BASOPHILS ABSOLUTE COUNT: 0 10*9/L (ref 0.0–0.1)
BASOPHILS RELATIVE PERCENT: 0.6 %
EOSINOPHILS ABSOLUTE COUNT: 0 10*9/L (ref 0.0–0.5)
EOSINOPHILS RELATIVE PERCENT: 0.6 %
HEMATOCRIT: 27.5 % — ABNORMAL LOW (ref 34.0–44.0)
HEMOGLOBIN: 9.5 g/dL — ABNORMAL LOW (ref 11.3–14.9)
LYMPHOCYTES ABSOLUTE COUNT: 1.6 10*9/L (ref 1.1–3.6)
LYMPHOCYTES RELATIVE PERCENT: 24.5 %
MEAN CORPUSCULAR HEMOGLOBIN CONC: 34.7 g/dL (ref 32.0–36.0)
MEAN CORPUSCULAR HEMOGLOBIN: 32.3 pg (ref 25.9–32.4)
MEAN CORPUSCULAR VOLUME: 92.9 fL (ref 77.6–95.7)
MEAN PLATELET VOLUME: 6.4 fL — ABNORMAL LOW (ref 6.8–10.7)
MONOCYTES ABSOLUTE COUNT: 0.6 10*9/L (ref 0.3–0.8)
MONOCYTES RELATIVE PERCENT: 10.1 %
NEUTROPHILS ABSOLUTE COUNT: 4.1 10*9/L (ref 1.8–7.8)
NEUTROPHILS RELATIVE PERCENT: 64.2 %
PLATELET COUNT: 194 10*9/L (ref 150–450)
RED BLOOD CELL COUNT: 2.96 10*12/L — ABNORMAL LOW (ref 3.95–5.13)
RED CELL DISTRIBUTION WIDTH: 16.8 % — ABNORMAL HIGH (ref 12.2–15.2)
WBC ADJUSTED: 6.4 10*9/L (ref 3.6–11.2)

## 2021-12-02 LAB — LACTATE DEHYDROGENASE: LACTATE DEHYDROGENASE: 366 U/L — ABNORMAL HIGH (ref 120–246)

## 2021-12-02 MED ORDER — VALACYCLOVIR 500 MG TABLET
ORAL_TABLET | Freq: Every day | ORAL | 3 refills | 90 days | Status: CP
Start: 2021-12-02 — End: ?

## 2021-12-02 NOTE — Unmapped (Unsigned)
PIV placed.  Labs drawn & sent for analysis. To next appt.  Care provided by Burns Spain RN.

## 2021-12-02 NOTE — Unmapped (Addendum)
Nice to see you today.     We discussed the following:      1. Continue to use tylenol as needed for pain related to bone marrow bx site, it looks good and the tenderness should improve over the next few days  2. Bone marrow biopsy results look great, no morphologic evidence of leukemia with very small detectable clone on PCR. This represents a great response to your treatment.  3. Plan for central line placement next Wednesday with admission for your next cycle of chemotherapy on Friday 12/10/21.  4. Hold your Sprycel until you begin your next cycle of chemotherapy in the hospital  5. Continue to take your Valtrex    We will see you back in 4 weeks.   Labs CBC, CMP, BCR/ABL pcr, LDH      [name], MD  Leukemia Program       Nurse Navigator (non-clinical trial patients): Elicia Lamp, RN        Tel. [phone]       Fax. [phone]  Toll-free appointments: [phone]  Scheduling assistance: [phone]  After hours/weekends: [phone] (ask for adult hematology/oncology on-call)      Lab Results   Component Value Date    WBC 6.4 12/02/2021    HGB 9.5 (L) 12/02/2021    HCT 27.5 (L) 12/02/2021    PLT 194 12/02/2021       Lab Results   Component Value Date    NA 141 12/02/2021    K 3.8 12/02/2021    CL 109 (H) 12/02/2021    CO2 25.0 12/02/2021    BUN 13 12/02/2021    CREATININE 0.63 12/02/2021    GLU 136 12/02/2021    CALCIUM 9.7 12/02/2021    PHOS 4.4 11/02/2021       Lab Results   Component Value Date    BILITOT 0.8 12/02/2021    BILIDIR 0.20 11/05/2021    PROT 6.9 12/02/2021    ALBUMIN 3.7 12/02/2021    ALT 39 12/02/2021    AST 36 (H) 12/02/2021    ALKPHOS 72 12/02/2021       Lab Results   Component Value Date    INR 0.92 11/01/2021    APTT 22.5 (L) 11/01/2021

## 2021-12-02 NOTE — Unmapped (Signed)
[facility]   Medical Nutrition Therapy Consultation       Visit Type:    Initial Assessment    Referral Reason:   GI upset      Patricia [name] is a 46 y.o. female seen for medical nutrition therapy for GI upset in context of cancer treatment. 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 Her active problem list, medication list, allergies, family history, social history, notes from last a few encounters, and lab results were reviewed.     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     Anthropometrics   Height: 170.2 cm  Current Weight: 81.2 kg  BMI: 28.03 kg/m2  Usual Body Weight: 185 lb (84.1 kg)  % Usual Body Weight: 97%  Ideal Body Weight: 61.4 kg (Hamwi Method)  % Ideal Body Weight: 132%  Recent wt changes: 4 kg wt loss in 1 month  % wt changes: 4.7% wt loss in 1 month    Weight history:  Wt Readings from Last 6 Encounters:   12/02/21 81.2 kg (179 lb 0.2 oz)   11/23/21 80.1 kg (176 lb 9.4 oz)   11/12/21 81.4 kg (179 lb 7.3 oz)   11/03/21 85.2 kg (187 lb 13.3 oz)       Nutrition Risk Screening:   Food Insecurity: No Food Insecurity    Worried About Programme researcher, broadcasting/film/video in the Last Year: Never true    Ran Out of Food in the Last Year: Never true        Nutrition-Focused Physical Findings   Overall Appearance: Nourished  Skin:  Intact    Physical Activity:  Physical activity level is light with some exercise.       ASPEN/AND Malnutrition Screening:   Nutrition-Focused Physical Exam not indicated due to lack of malnutrition risk factors.      Biochemical Data, Medical Tests and Procedures:  All pertinent labs and imaging reviewed by Howie Ill at 1:00 PM 12/02/2021.    Medications and Vitamin/Mineral Supplementation:   All nutritionally pertinent medications reviewed on 12/02/2021.   She is not taking nutrition supplements.     Current Outpatient Medications   Medication Sig Dispense Refill    amoxicillin-clavulanate (AUGMENTIN) 875-125 mg per tablet Take 1 tablet by mouth Two (2) times a day for 7 days. 14 tablet 0    cetirizine (ZYRTEC) 10 MG tablet Take 1 tablet (10 mg total) by mouth nightly. 30 tablet 2    dasatinib (SPRYCEL) 70 MG tablet Take 2 tablets (140 mg total) by mouth daily. 60 tablet 6    polyethylene glycol (GLYCOLAX) 17 gram/dose powder Take 1 packet (17 g) by mouth two (2) times a day as needed. Mix with 4-8 oz liquid and drink. 1020 g 0    valACYclovir (VALTREX) 500 MG tablet Take 1 tablet (500 mg total) by mouth daily. 90 tablet 3     No current facility-administered medications for this visit.       Nutrition History:     Dietary Restrictions: No known food allergies. She noted food intolerance to dairy- milk.    Gastrointestinal Issues: Nausea Diarrhea Constipation    Reports experiencing burning in lower abdomen; however, symptoms have improved since reducing acidic/spicy foods in diet.    Reports experiencing diarrhea intermittently. Managing constipation with apple juice.    Oral Issues: none reported    Hunger and Satiety:  Decreased  appetite    Food Safety and Access: She did not report issues.       Diet Recall:   Breakfast: cereal or oatmeal made with water  Lunch: peanut butter and jelly or grilled cheese sandwich  Dinner: potato soup  Snack(s): bagel or toast with peanut butter  Beverages: 1 cup green tea, 1 cup apple juice, 20 oz water, 8-12 oz Gatorade, some gingerale/Mountain Dew    Alcohol: none reported  Supplements: recently tried Ensure Plus  Enteral feedings: no feeding tube    Reports drinking 1 cup green tea daily to manage headaches. Pt describes urine color as variable.  Nutrition Assessment     Inadequate intake related to cancer treatment as evidenced by diet history, unplanned wt loss, urine color. Likely meeting <85% estimated nutrition needs daily.    Daily Estimated Nutritional Needs:  Energy: RMR X 1.2-1.3 AF ([place] [employer] Jeor, actual wt)  Protein: 1-1.2 g/kg (actual wt)  Carbohydrate: no restriction  Fluid: 1 ml/kcal    Nutrition Goals & Evaluation    - Prevent >= 1% wt loss per week.  (New)  - Meet >= 85% estimated nutrition needs daily.  (New)  - Adequate hydration.  (New)      Nutrition goals reviewed, and relevant barriers identified and addressed: none evident. She is evaluated to have excellent willingness and ability to achieve nutrition goals.     Nutrition Intervention      - Nutrition Education: GERD diet/eating style. Education resources provided include: handouts promoting nutrition intervention , lists of foods recommended and foods not recommended, and contact information   - Nutrition Counseling: Goal setting Problem solving  - Oral Nutrition Supplementation: Ensure Plus High Protein    Nutrition Plan:   Recommend small, frequent meals/snacks including high calorie/high protein foods.  Discussed limiting high fat, citrus foods, tomato-based products, and caffeine.  Recommend 8-10 cup fluids daily. May incorporate alternative fluids such as ice pops, herbal/decaf teas, broths.  Recommend Ensure Plus High Protein once daily.      Materials Provided: GERD Nutrition Therapy, High Protein Foods/Snacks, NCI Eating Hints booklet      Follow up will occur in 1 month, or as needed.       Food/Nutrition-related history, Anthropometric measurements, Biochemical data, medical tests, procedures, Nutrition-focused physical findings, Patient understanding or compliance with intervention and recommendations , and Effectiveness of nutrition interventions will be assessed at time of follow-up.     Recommendations for Clinical Team, Care Team , and Referring Provider :  - Encourage pt to follow nutrition plan as noted above.       Time spent 31 minutes

## 2021-12-02 NOTE — Unmapped (Signed)
Pt did not need transfusion today, counts were above parameter. PIV removed with tip intact. Infusion called and made aware.

## 2021-12-06 NOTE — Unmapped (Signed)
VIR pre procedure prep call completed. Reviewed to register at 1000 through main entrance at [facility] then proceed to VIR on 2nd floor. NPO guidelines reviewed. Pt OK to take sips of clear liquids with all AM meds. Pt states her blood sugar tends to run on the low side. Advised pt we can check pre-procedure. COVID screening completed. Pt aware of need for driver >01 years of age. Pt verbalized understanding. All questions answered.

## 2021-12-08 ENCOUNTER — Ambulatory Visit: Admit: 2021-12-08 | Discharge: 2021-12-08 | Payer: PRIVATE HEALTH INSURANCE

## 2021-12-08 DIAGNOSIS — C9100 Acute lymphoblastic leukemia not having achieved remission: Principal | ICD-10-CM

## 2021-12-08 MED ADMIN — midazolam (VERSED) injection: INTRAVENOUS | @ 16:00:00 | Stop: 2021-12-08

## 2021-12-08 MED ADMIN — sodium chloride (NS) 0.9 % infusion: INTRAVENOUS | @ 16:00:00 | Stop: 2021-12-08

## 2021-12-08 MED ADMIN — fentaNYL (PF) (SUBLIMAZE) injection: INTRAVENOUS | @ 16:00:00 | Stop: 2021-12-08

## 2021-12-08 MED ADMIN — heparin lock flush (porcine) 100 unit/mL injection: INTRAVENOUS | @ 16:00:00 | Stop: 2021-12-08

## 2021-12-08 NOTE — Unmapped (Signed)
[place] INTERVENTIONAL RADIOLOGY - Operative Note     VIR Post-Procedure Note    Procedure Name: port placement    Pre-Op Diagnosis: ALL    Post-Op Diagnosis: Same as pre-operative diagnosis    VIR Providers    Operator: Anice Paganini, PA-C     Time out: Prior to the procedure, a time out was performed with all team members present. During the time out, the patient, procedure and procedure site when applicable were verbally verified by the team members and Madilyn Fireman Teagen Mcleary.    Description of procedure: Successful placement of a double-lumen port in the right internal jugular vein.     Sedation:Moderate sedation    Estimated Blood Loss: approximately <5 mL  Complications: None    See detailed procedure note with images in PACS [facility]).    The patient tolerated the procedure well without incident or complication and left the room in stable condition.    Anice Paganini, PA-C   12/08/2021 1:01 PM

## 2021-12-08 NOTE — Unmapped (Signed)
[name] INTERVENTIONAL RADIOLOGY - Pre Procedure H/P      Assessment/Plan:    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 female who will undergo placement of a double-lumen port with moderate sedation in Interventional Radiology. Port does not need to be left accessed.     Informed Consent:  This procedure and sedation has been fully reviewed with the patient/patient???s authorized representative. The risks, benefits and alternatives including but not limited to infection, bleeding, damage to adjacent structures have been explained, and the patient/patient???s authorized representative has consented to the procedure. Consent obtained by Anice Paganini, witnessed, and scanned into the medical record.  --The patient will accept blood products in an emergent situation.  --The patient does not have a Do Not Resuscitate order in effect.      HPI: 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 female with PMH of ALL who presents for placement of a double-lumen port for planned chemotherapy. No prior lines.     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    Allergies:   Allergies   Allergen Reactions    Erythromycin Hives    Sulfa (Sulfonamide Antibiotics) Anaphylaxis    Azithromycin        Medications:  No relevant medications, please see full medication list in Epic.    ASA Grade: ASA 2 - Patient with mild systemic disease with no functional limitations    PE:    Vitals:    12/08/21 1030   BP: 125/87   Pulse: 83   Resp: 18   Temp: 37.2 ??C (99 ??F)   SpO2: 99%     General: female in NAD.  Airway assessment: Class 1 - Can visualize soft palate, fauces, uvula, and tonsillar pillars  Lungs: Respirations nonlabored

## 2021-12-09 NOTE — Unmapped (Signed)
Post procedure phone call complete. Pt states she is having a lot of pain at the site. States she called oncology nurse who told her to take prescribed oxycodone every 4-6 hours for pain. Pt will be admitted through infusion tomorrow. Advised to call back if they have any additional questions or concerns.

## 2021-12-10 ENCOUNTER — Ambulatory Visit
Admit: 2021-12-10 | Discharge: 2021-12-15 | Disposition: A | Discharge status: Home / Self Care | Payer: PRIVATE HEALTH INSURANCE

## 2021-12-10 ENCOUNTER — Encounter
Admit: 2021-12-10 | Discharge: 2021-12-15 | Disposition: A | Discharge status: Home / Self Care | Payer: PRIVATE HEALTH INSURANCE

## 2021-12-10 ENCOUNTER — Encounter: Admit: 2021-12-10 | Discharge: 2021-12-15 | Payer: PRIVATE HEALTH INSURANCE

## 2021-12-10 LAB — URINALYSIS WITH MICROSCOPY
BACTERIA: NONE SEEN /HPF
BILIRUBIN UA: NEGATIVE
BILIRUBIN UA: NEGATIVE
BLOOD UA: NEGATIVE
BLOOD UA: NEGATIVE
GLUCOSE UA: NEGATIVE
GLUCOSE UA: NEGATIVE
KETONES UA: NEGATIVE
KETONES UA: NEGATIVE
LEUKOCYTE ESTERASE UA: NEGATIVE
LEUKOCYTE ESTERASE UA: NEGATIVE
NITRITE UA: NEGATIVE
NITRITE UA: NEGATIVE
PH UA: 6 (ref 5.0–9.0)
PH UA: 8 (ref 5.0–9.0)
PROTEIN UA: NEGATIVE
PROTEIN UA: NEGATIVE
RBC UA: <1 /HPF (ref <=4)
RBC UA: <1 /HPF (ref <=4)
SPECIFIC GRAVITY UA: 1.01 (ref 1.003–1.030)
SPECIFIC GRAVITY UA: 1.006 (ref 1.003–1.030)
SQUAMOUS EPITHELIAL: 3 /HPF (ref 0–5)
SQUAMOUS EPITHELIAL: <1 /HPF (ref 0–5)
UROBILINOGEN UA: <2
UROBILINOGEN UA: <2
WBC UA: <1 /HPF (ref 0–5)
WBC UA: <1 /HPF (ref 0–5)

## 2021-12-10 LAB — CBC W/ AUTO DIFF
BASOPHILS ABSOLUTE COUNT: 0 10*9/L (ref 0.0–0.1)
BASOPHILS RELATIVE PERCENT: 0.6 %
EOSINOPHILS ABSOLUTE COUNT: 0.2 10*9/L (ref 0.0–0.5)
EOSINOPHILS RELATIVE PERCENT: 4.1 %
HEMATOCRIT: 24.1 % — ABNORMAL LOW (ref 34.0–44.0)
HEMOGLOBIN: 8.3 g/dL — ABNORMAL LOW (ref 11.3–14.9)
LYMPHOCYTES ABSOLUTE COUNT: 1.1 10*9/L (ref 1.1–3.6)
LYMPHOCYTES RELATIVE PERCENT: 25.4 %
MEAN CORPUSCULAR HEMOGLOBIN CONC: 34.6 g/dL (ref 32.0–36.0)
MEAN CORPUSCULAR HEMOGLOBIN: 32.8 pg — ABNORMAL HIGH (ref 25.9–32.4)
MEAN CORPUSCULAR VOLUME: 95 fL (ref 77.6–95.7)
MEAN PLATELET VOLUME: 7 fL (ref 6.8–10.7)
MONOCYTES ABSOLUTE COUNT: 0.6 10*9/L (ref 0.3–0.8)
MONOCYTES RELATIVE PERCENT: 15.3 %
NEUTROPHILS ABSOLUTE COUNT: 2.3 10*9/L (ref 1.8–7.8)
NEUTROPHILS RELATIVE PERCENT: 54.6 %
PLATELET COUNT: 233 10*9/L (ref 150–450)
RED BLOOD CELL COUNT: 2.54 10*12/L — ABNORMAL LOW (ref 3.95–5.13)
RED CELL DISTRIBUTION WIDTH: 21.7 % — ABNORMAL HIGH (ref 12.2–15.2)
WBC ADJUSTED: 4.2 10*9/L (ref 3.6–11.2)

## 2021-12-10 LAB — COMPREHENSIVE METABOLIC PANEL
ALBUMIN: 3.8 g/dL (ref 3.4–5.0)
ALKALINE PHOSPHATASE: 63 U/L (ref 46–116)
ALT (SGPT): 24 U/L (ref 10–49)
ANION GAP: 7 mmol/L (ref 5–14)
AST (SGOT): 22 U/L (ref <=34)
BILIRUBIN TOTAL: 0.8 mg/dL (ref 0.3–1.2)
BLOOD UREA NITROGEN: 13 mg/dL (ref 9–23)
BUN / CREAT RATIO: 21
CALCIUM: 9.5 mg/dL (ref 8.7–10.4)
CHLORIDE: 106 mmol/L (ref 98–107)
CO2: 27 mmol/L (ref 20.0–31.0)
CREATININE: 0.62 mg/dL
EGFR CKD-EPI (2021) FEMALE: >90 mL/min/{1.73_m2} (ref >=60)
GLUCOSE RANDOM: 85 mg/dL (ref 70–179)
POTASSIUM: 3.8 mmol/L (ref 3.4–4.8)
PROTEIN TOTAL: 6.8 g/dL (ref 5.7–8.2)
SODIUM: 140 mmol/L (ref 135–145)

## 2021-12-10 LAB — HCG QUANTITATIVE, BLOOD: GONADOTROPIN, CHORIONIC (HCG) QUANT: <2.6 m[IU]/mL

## 2021-12-10 MED ADMIN — sodium chloride (NS) 0.9 % flush 10 mL: 10 mL | INTRAVENOUS | @ 18:00:00

## 2021-12-10 MED ADMIN — sodium bicarbonate 150 mEq in dextrose 5 % 1,150 mL continuous infusion: 150 meq | INTRAVENOUS | @ 18:00:00

## 2021-12-10 MED ADMIN — dextromethorphan-guaifenesin (ROBITUSSIN-DM) 2-20 mg/mL oral syrup: 5 mL | ORAL | @ 19:00:00

## 2021-12-10 MED ADMIN — acetaZOLAMIDE (DIAMOX) tablet 500 mg: 500 mg | ORAL | @ 23:00:00 | Stop: 2021-12-10

## 2021-12-10 MED ADMIN — oxyCODONE (ROXICODONE) immediate release tablet 5-10 mg: 5-10 mg | ORAL | Stop: 2021-12-24

## 2021-12-10 MED ADMIN — acetaZOLAMIDE (DIAMOX) tablet 500 mg: 500 mg | ORAL | @ 18:00:00 | Stop: 2021-12-10

## 2021-12-10 MED ADMIN — menthol (COUGH DROPS) lozenge 1 lozenge: 1 | ORAL | @ 19:00:00

## 2021-12-10 MED ADMIN — menthol (COUGH DROPS) lozenge 1 lozenge: 1 | ORAL | @ 23:00:00

## 2021-12-10 MED ADMIN — dasatinib (SPRYCEL) tablet 70 mg: 70 mg | ORAL | @ 20:00:00

## 2021-12-10 NOTE — Unmapped (Unsigned)
Hematology/Oncology     Attending Physician :  No att. providers found  Accepting Service  : No service for patient encounter.  Reason for Admission: C 2 HYPERCVAD    Problem List:   Patient Active Problem List   Diagnosis   ??? Leukocytosis   ??? B-cell acute lymphoblastic leukemia (ALL) (CMS-HCC)   ??? Acute cough   ??? Dyspepsia   ??? Acute frontal sinusitis        Assessment/Plan: Patricia [name] is a 46 y.o. 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  Patient underwent [id number] induction without adverse effects noted aside from superficial thrombus associated with PICC.   - Start Dasatinib to 70 mg on admission  -3 of 13 IT Therapies completed, Last on 4/20  - Next ITT planned for D7  -Next bone marrow biopsy following cycle 3*** Requested***    cRegimen: R-HyperCVAD  Cycle # 2  Primary Oncologist: Senaida Ores      Even Cycles  Date        Day 1 2 3 4 5    Methotrexate IV 1000mg /m2 x       Cytarabine 3g/m2 Q12H  X  x X  x     Methotrexate IT 12mg   x        EVEN CYCLE DETAILS  - OK to treat placed on ***  - If age <75 yo will need 24 hours MTX level otherwise will follow 40 hour MTX level***  - bicarb continuous infusion until MTX level <0.05  - Leucovorin titration until MTX level <0.05   - daily UA to maintain pH >/= 7.0     Supportive  -- antiemetics per protocol    Inpatient Prophylactic: Valtrex 500 mg PO daily      Disposition EVEN:  - Neutlasta ***  - Three times weekly labs ***  - Dr. Marland Kitchen follow up ***    Grade 1 peripheral neuropathy: Paraesthesia present primarily in her fingers, currently without medical interventions continue to monitor while on therapy.    History of Visual Floaters - HA:??Patient reported visual floaters on admission. 4/6 Reports floaters persist to right eye, denies any occular pain or headaches. Optho assessed and noted bilateral retinal hemorrhages, no specific recommendations other than to transfuse per oncology parameters.    ??  Superficial Thrombus: Reported bruising and discomfort to PICC site overnight 4/6. PVL with superficial thrombus in basilic vein, no e/o DVT. PICC line was removed given discomfort and superficial thrombus. We have requested a port placement week of 5/8.  ??  Chronic??HTN: Home regimen of Losartan 50 mg. Held??on admission given TLS risk. She was instructed to stop on discharge given normal BP's during entire induction.   ??  Constipation:??Reports BM every 2-3 days at baseline. Given hx, opioid use for pain control, and initiation of vincristine, bowel regimen started. She will continue Miralax and Senna PRN  On admission  - Miralax daily,  - Senna PRN  ??  Anxiety: Home regimen of Lexapro 10 mg every day.??Ativan??0.5 mg PO TID??PRN started 3/30 per palliative care recommendations and will continue both on admission  - Lexapro 10  - ATivan PO TID PRN.  Acute on Chronic back pain, upper and lower back  - PRN oxycodone  - recommended [facility]. She reports she will call them next week to arrange a visit    Headaches  - PRN Esgisic    Resent History of Frontal sinus pressure with history of acute sinus infections-  -RX augmentin 875-125mg  PO BID x 7(5/1-5/8) days to local pharmacy for acute sinusitis treatment  [ ]  Monitor for recurrent symptoms during admission    Psychosocial distress: She reports a moderate level of anxiety regarding the management of the above.   - Counseling given   - Consider ref to comprehensive cancer support program        Cancer related fatigue:  Patient endorses fatigue with onset of cancer symptoms or treatment.  Symptoms started ***weeks ago.  Primary symptoms are ***      Cancer related Pain:  Patient endorses pain associated with .  Pain worsens with *** .  Pain has/has*** not improved with treatment  -Current Regimen***    Immunocompromised status: Patient is immunocompromised secondary to*** disease or chemotherapy  -Antimicrobial prophylaxis as above     Impending Electrolyte Abnormality Secondary to Chemotherapy and/or IV Fluids  -Daily Electrolyte monitoring  -Replete per [facility] guidelines.     ***  - Transfuse 1 unit of pRBCs for hgb >7  - Transfuse 1 unit plt for plts >10K     HPI: Patricia [name] is an 46 y.o. female who presented with ***    Review of Systems: All positive and pertinent negatives are noted in the HPI; otherwise all other systems are negative    Oncologic History:   Primary Oncologist: Senaida Ores  Oncology History Overview Note   Referring/Local Oncologist:    Diagnosis:   10/29/2021  Bone marrow, left iliac, aspiration and biopsy  -  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 The majority of the abnormal cells (64/88) contained an additional BCR/ABL1 fusion signal, while 9/88 abnormal cells contained an additional ABL1 and ASS1 signal.       Genetics:   Karyotype/FISH:   RESULTS   Date Value Ref Range Status   10/28/2021   Final    NOTE: This report reflects a combined study from a peripheral blood and a bone marrow core biopsy. Eleven cells from the peripheral blood and nine cells from the bone marrow core biopsy were analyzed. The BCR/ABL1 FISH analysis was performed on the peripheral blood.     Abnormal Karyotype: 47,XX,t(9;22)(q34;q11.2),+der(22)t(9;22)[18]/46,XX[2]    Abnormal FISH:  A BCR/ABL1 interphase FISH assay showed a signal pattern consistent with a BCR::ABL1 rearrangement and the 9;22 translocation in 88% of the 100 cells scored. The majority of the abnormal cells (64/88) contained an additional BCR/ABL1 fusion signal, while 9/88 abnormal cells contained an additional ABL1 and ASS1 signal.           Pertinent Phenotypic data:  CD19 98%  CD20 on diagnosis 51%  CD22 98%           B-cell acute lymphoblastic leukemia (ALL) (CMS-HCC)   10/29/2021 Initial Diagnosis    B-cell acute lymphoblastic leukemia (ALL) (CMS-HCC)     10/30/2021 -  Chemotherapy  IP/OP LEUKEMIA [id number] < 60 YO (OP PEGFILGRASTIM ON DAY 7)  [No description for this plan]         Medical History:  PCP: [name], MD  Past Medical History:   Diagnosis Date   ??? 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 IR INSERT PORT AGE GREATER THAN 5 YRS  12/08/2021    IR INSERT PORT AGE GREATER THAN 5 YRS 12/08/2021 [name], PA IMG VIR [facility]      Social History:  Social History     Socioeconomic History   ??? Marital status: Married   Tobacco Use   ??? Smoking status: Never   ??? Smokeless tobacco: Never     Social Determinants of Health     Financial Resource Strain: Low Risk    ??? Difficulty of Paying Living Expenses: Not hard at all   Food Insecurity: No Food Insecurity   ??? Worried About Programme researcher, broadcasting/film/video in the Last Year: Never true   ??? Ran Out of Food in the Last Year: Never true   Transportation Needs: No Transportation Needs   ??? Lack of Transportation (Medical): No   ??? Lack of Transportation (Non-Medical): No      Family History:   family history is not on file.***       Allergies: is allergic to erythromycin, sulfa (sulfonamide antibiotics), and azithromycin.    Medications:   Meds:  Continuous Infusions:  PRN Meds:.    Objective:   Vitals: @VSRANGES @    Physical Exam:  General: Resting, in no apparent distress, lying in bed  HEENT:  PERRL. No scleral icterus or conjunctival injection. MMM without ulceration, erythema or exudate. No cervical or axillary lymphadenopathy.   Heart:  RRR. S1, S2. No murmurs, gallops, or rubs.  Lungs:  Breathing is unlabored, and patient is speaking full sentences with ease.  No stridor.  CTAB. No rales, ronchi, or crackles.    Abdomen:  No distention or pain on palpation.  Bowel sounds are present and normoactive x 4.  No palpable hepatomegaly or splenomegaly.  No palpable masses.  Skin:  No rashes, petechiae or purpura.  No areas of skin breakdown. Warm to touch, dry, smooth, and even.  Musculoskeletal:  No grossly-evident joint effusions or deformities.  Range of motion about the shoulder, elbow, hips and knees is grossly normal.  Psychiatric:  Range of affect is appropriate.    Neurologic:  Alert and oriented to person, place, time and situation.  Gait is normal.  No Nystagmus Cerebellar tasks (finger-to-nose, rapid hand movement, heel along shin) are completed with ease and are symmetric. CNII-CNXII grossly intact.  Extremities:  Appear well-perfused. No clubbing, edema, or cyanosis.  CVAD: R CW Port - no erythema, nontender; dressing CDI.      Test Results  No results for input(s): WBC, NEUTROABS, HGB, PLT in the last 72 hours.  No results for input(s): NA, K, CL, CO2, BUN, CREATININE, MG, PHOS, CALCIUM in the last 72 hours.    Imaging:     DVT PPX Indicated: ,   FEN:  Discharge Plan:  - fluids:   - electrolytes: stable ***  - diet: regular ***    Need for PT:   Anticipated Discharge:     Code Status: @PATFPLSTATCODE @   @RRCODESTATUS @  ***    Time spent on counseling/coordination of care:   Total time spent with patient:  Discharge: {dispo:84474::Their home}    Code Status: @PATFPLSTATCODE @   @RRCODESTATUS @  ***    Time spent on counseling/coordination of care: {time intervals:31374}  Total time spent with patient: {time intervals:31374}

## 2021-12-10 NOTE — Unmapped (Signed)
Pt admitted from infusion clinic for C2 HyperCVAD. PRN robitussin and cough drops given. Sodium bicarb started at 186ml/hr. Diamox given. Family at bedside.     Problem: Adult Inpatient Plan of Care  Goal: Plan of Care Review  12/10/2021 1556 by Vaughan Browner, RN  Outcome: Progressing  12/10/2021 1556 by Vaughan Browner, RN  Outcome: Progressing  Goal: Patient-Specific Goal (Individualized)  12/10/2021 1556 by Vaughan Browner, RN  Outcome: Progressing  12/10/2021 1556 by Vaughan Browner, RN  Outcome: Progressing  Goal: Absence of Hospital-Acquired Illness or Injury  12/10/2021 1556 by Vaughan Browner, RN  Outcome: Progressing  12/10/2021 1556 by Vaughan Browner, RN  Outcome: Progressing  Intervention: Identify and Manage Fall Risk  Recent Flowsheet Documentation  Taken 12/10/2021 1348 by Vaughan Browner, RN  Safety Interventions:   family at bedside   lighting adjusted for tasks/safety   low bed  Intervention: Prevent Skin Injury  Recent Flowsheet Documentation  Taken 12/10/2021 1348 by Vaughan Browner, RN  Skin Protection: adhesive use limited  Intervention: Prevent and Manage VTE (Venous Thromboembolism) Risk  Recent Flowsheet Documentation  Taken 12/10/2021 1348 by Vaughan Browner, RN  Activity Management: activity adjusted per tolerance  Intervention: Prevent Infection  Recent Flowsheet Documentation  Taken 12/10/2021 1348 by Vaughan Browner, RN  Infection Prevention: cohorting utilized  Goal: Optimal Comfort and Wellbeing  12/10/2021 1556 by Vaughan Browner, RN  Outcome: Progressing  12/10/2021 1556 by Vaughan Browner, RN  Outcome: Progressing  Goal: Readiness for Transition of Care  12/10/2021 1556 by Vaughan Browner, RN  Outcome: Progressing  12/10/2021 1556 by Vaughan Browner, RN  Outcome: Progressing  Goal: Rounds/Family Conference  12/10/2021 1556 by Vaughan Browner, RN  Outcome: Progressing  12/10/2021 1556 by Vaughan Browner, RN  Outcome: Progressing

## 2021-12-10 NOTE — Unmapped (Signed)
1025 Pt arrived for scheduled admission through infusion. Pt is A&Ox4 and UAL. Port accessed with power needles and labs collected. 1340 report given to [facility] nurse and pt transported to room 4810.

## 2021-12-10 NOTE — Unmapped (Cosign Needed)
Hematology/Oncology     Attending Physician :  Providence Crosby Dittu*  Accepting Service  : Oncology/Hematology (MDE)  Reason for Admission: scheduled chemo     Problem List:   Patient Active Problem List   Diagnosis    Leukocytosis    B-cell acute lymphoblastic leukemia (ALL) (CMS-HCC)    Acute cough    Dyspepsia    Acute frontal sinusitis       Assessment/Plan: Patricia [name] is an 46 y.o. 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      Ph+ALL, in remission: Initially diagnosed in 09/2021 and underwent [id number] induction complicated by PICC line associated superficial thrombus. BMBx on 5/2 revealed morphologic CR. Admitted for Cycle 2 HyperCVAD + Dasatinib. Patient has completed #3 IT chemos prior to admission.   - D1=5/12 of HyperCVAD (Methotrexate/Cytarabine)  - Dasatinib to 70 mg on admission  - IT Cytarabine to be given on 5/15 - orders/chemo clarification placed      cRegimen: R-HyperCVAD  Cycle # 2  Primary Oncologist: Senaida Ores     Even Cycles  Date             Day 1 2 3 4 5    Methotrexate IV 1000mg /m2 x           Cytarabine 3g/m2 Q12H   X  x X  x       Cytarabine IT        x        EVEN CYCLE DETAILS  - If age <20 yo will need 24 hours MTX level   - bicarb continuous infusion until MTX level <0.05  - Leucovorin titration until MTX level <0.05   - daily UA to maintain pH >/= 7.0         Disposition EVEN:  - Neulasta on ~5/17 - requested   - Twice weekly labs - scheduled   - IT Methotrexate on 5/19 - requested   - Follow with Dr. Trula Ore - requested      Grade 1 peripheral neuropathy: Paraesthesia present primarily in her fingers, currently without medical interventions continue to monitor while on therapy.  - CTM       Anxiety: Reports taking Lexapro 10 mg at home and Ativan 0.5 mg PO TID PRN. Patient would like to hold off on Lexapro during admission has she has been holding this medicine at home.   - Ativan PO TID PRN.     Acute on Chronic back pain, upper and lower back  - PRN oxycodone     Headaches  - PRN Esgisic     Resent History acute sinusitis: Completed augmentin 875-125mg  PO BID x 7(5/1-5/8). Symptoms improving.   - CMT       Psychosocial distress: She reports a moderate level of anxiety regarding the management of the above.   - Counseling given   - Consider ref to comprehensive cancer support program      Cancer related fatigue:  Patient endorses fatigue with onset of cancer symptoms or treatment.      Immunocompromised status: Patient is immunocompromised secondary to disease or chemotherapy  -Antimicrobial prophylaxis as above     Impending Electrolyte Abnormality Secondary to Chemotherapy and/or IV Fluids  -Daily Electrolyte monitoring  -Replete per [facility] guidelines.      Impending Pancytopenia secondary to Acute Leukemia/chemotherapy:   - Transfuse 1 unit of pRBCs for  hgb >7  - Transfuse 1 unit plt for plts >10K       HPI: Patricia [name] is an 46 y.o. 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    Patient has been feeling. She is excited about her BMBx results. She reports having some nasal congestion and drainage cough that she attributes to allergies and states this is improving. She recently completed a Augmentin ABX course.     Review of Systems: All positive and pertinent negatives are noted in the HPI; otherwise all other systems are negative    Oncologic History:   Primary Oncologist: Senaida Ores   Oncology History Overview Note   Referring/Local Oncologist:    Diagnosis:   10/29/2021  Bone marrow, left iliac, aspiration and biopsy  -  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 The majority of the abnormal cells (64/88) contained an additional BCR/ABL1 fusion signal, while 9/88 abnormal cells contained an additional ABL1 and ASS1 signal.       Genetics:   Karyotype/FISH:   RESULTS   Date Value Ref Range Status   10/28/2021   Final    NOTE: This report reflects a combined study from a peripheral blood and a bone marrow core biopsy. Eleven cells from the peripheral blood and nine cells from the bone marrow core biopsy were analyzed. The BCR/ABL1 FISH analysis was performed on the peripheral blood.     Abnormal Karyotype:  47,XX,t(9;22)(q34;q11.2),+der(22)t(9;22)[18]/46,XX[2]    Abnormal FISH:  A BCR/ABL1 interphase FISH assay showed a signal pattern consistent with a BCR::ABL1 rearrangement and the 9;22 translocation in 88% of the 100 cells scored. The majority of the abnormal cells (64/88) contained an additional BCR/ABL1 fusion signal, while 9/88 abnormal cells contained an additional ABL1 and ASS1 signal.           Pertinent Phenotypic data:  CD19 98%  CD20 on diagnosis 51%  CD22 98%           B-cell acute lymphoblastic leukemia (ALL) (CMS-HCC)   10/29/2021 Initial Diagnosis    B-cell acute lymphoblastic leukemia (ALL) (CMS-HCC)       10/30/2021 -  Chemotherapy    IP/OP LEUKEMIA GRAAPH-2005 < 60 YO (OP PEGFILGRASTIM ON DAY 7)  [No description for this plan]           Medical History:  PCP: [name], MD  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    Surgical History:  Past Surgical History:   Procedure Laterality Date    IR INSERT PORT AGE GREATER THAN 5 YRS  12/08/2021    IR INSERT PORT AGE GREATER THAN 5 YRS 12/08/2021 [name], PA IMG VIR [facility]      Social History:  Social History     Socioeconomic History    Marital status: Married   Tobacco Use    Smoking status: Never    Smokeless tobacco: Never     Social Determinants of Health     Financial Resource Strain: Low Risk     Difficulty  of Paying Living Expenses: Not hard at all   Food Insecurity: No Food Insecurity    Worried About Running Out of Food in the Last Year: Never true    Ran Out of Food in the Last Year: Never true   Transportation Needs: No Transportation Needs    Lack of Transportation (Medical): No    Lack of Transportation (Non-Medical): No      Family History:   family history is not on file.       Allergies: is allergic to erythromycin, sulfa (sulfonamide antibiotics), and azithromycin.    Medications:   Meds:   cetirizine  10 mg Oral Nightly    polyethylene glycol  17 g Oral Daily    sodium chloride  10 mL Intravenous BID    sodium chloride  10 mL Intravenous BID    sodium chloride  10 mL Intravenous BID    valACYclovir  500 mg Oral Daily     Continuous Infusions:   IP okay to treat      sodium chloride       PRN Meds:.emollient combination no.92, IP okay to treat, loperamide, loperamide, LORazepam, oxyCODONE    Objective:   Vitals: Temp:  [37.1 ??C (98.8 ??F)] 37.1 ??C (98.8 ??F)  Heart Rate:  [88] 88  Resp:  [18] 18  BP: (136)/(89) 136/89  BMI (Calculated):  [28.14] 28.14    Physical Exam:  General: Resting, in no apparent distress, lying in bed  HEENT:  PERRL. No scleral icterus or conjunctival injection. MMM without ulceration, erythema or exudate. Heart:  RRR. S1, S2. No murmurs, gallops, or rubs.  Lungs:  Breathing is unlabored, and patient is speaking full sentences with ease.  No stridor.  CTAB. No rales, ronchi, or crackles.    Abdomen:  No distention or pain on palpation.  Bowel sounds are present and normoactive x 4.  No palpable hepatomegaly or splenomegaly.  No palpable masses.  Skin:  No rashes, petechiae or purpura.  No areas of skin breakdown. Warm to touch, dry, smooth, and even.  Musculoskeletal:  No grossly-evident joint effusions or deformities.  Range of motion about the shoulder, elbow, hips and knees is grossly normal.  Psychiatric:  Range of affect is appropriate.    Neurologic:  Alert and oriented to person, place, time and situation.  Gait is normal.  No Nystagmus Cerebellar tasks (finger-to-nose, rapid hand movement, heel along shin) are completed with ease and are symmetric. CNII-CNXII grossly intact.  Extremities:  Appear well-perfused. No clubbing, edema, or cyanosis.  CVAD: R CW Port - no erythema, nontender; dressing CDI.      Test Results  Recent Labs     12/10/21  1058   WBC 4.2   NEUTROABS 2.3   HGB 8.3*   PLT 233     Recent Labs     12/10/21  1058   NA 140   K 3.8   CL 106   CO2 27.0   BUN 13   CREATININE 0.62   CALCIUM 9.5       Imaging: None    DVT PPX Indicated: no,  low risk   FEN:  Discharge Plan:  - fluids: yes  - electrolytes: stable   - diet: regular     Need for PT: no  Anticipated Discharge: home    Code Status:   Full Code      Time spent on counseling/coordination of care: 45 Minutes  Total time spent with patient: 30 Minutes  Irena Cords, PA-C  12/10/2021  1:46 PM   Physician Assistant   Hematology/Oncology Department   [facility]   Group Pager: [phone]

## 2021-12-11 LAB — BASIC METABOLIC PANEL
ANION GAP: 9 mmol/L (ref 5–14)
BLOOD UREA NITROGEN: 10 mg/dL (ref 9–23)
BUN / CREAT RATIO: 15
CALCIUM: 8.4 mg/dL — ABNORMAL LOW (ref 8.7–10.4)
CHLORIDE: 107 mmol/L (ref 98–107)
CO2: 24 mmol/L (ref 20.0–31.0)
CREATININE: 0.68 mg/dL
EGFR CKD-EPI (2021) FEMALE: >90 mL/min/{1.73_m2} (ref >=60)
GLUCOSE RANDOM: 108 mg/dL (ref 70–179)
POTASSIUM: 3.6 mmol/L (ref 3.4–4.8)
SODIUM: 140 mmol/L (ref 135–145)

## 2021-12-11 LAB — URINALYSIS WITH MICROSCOPY
BILIRUBIN UA: NEGATIVE
BLOOD UA: NEGATIVE
GLUCOSE UA: NEGATIVE
KETONES UA: NEGATIVE
LEUKOCYTE ESTERASE UA: NEGATIVE
NITRITE UA: NEGATIVE
PH UA: 8.5 (ref 5.0–9.0)
PROTEIN UA: NEGATIVE
RBC UA: 1 /HPF (ref <=4)
SPECIFIC GRAVITY UA: 1.009 (ref 1.003–1.030)
SQUAMOUS EPITHELIAL: <1 /HPF (ref 0–5)
UROBILINOGEN UA: <2
WBC UA: <1 /HPF (ref 0–5)

## 2021-12-11 LAB — CBC W/ AUTO DIFF
BASOPHILS ABSOLUTE COUNT: 0 10*9/L (ref 0.0–0.1)
BASOPHILS RELATIVE PERCENT: 0.6 %
EOSINOPHILS ABSOLUTE COUNT: 0.2 10*9/L (ref 0.0–0.5)
EOSINOPHILS RELATIVE PERCENT: 3.8 %
HEMATOCRIT: 24.1 % — ABNORMAL LOW (ref 34.0–44.0)
HEMOGLOBIN: 8.3 g/dL — ABNORMAL LOW (ref 11.3–14.9)
LYMPHOCYTES ABSOLUTE COUNT: 1.8 10*9/L (ref 1.1–3.6)
LYMPHOCYTES RELATIVE PERCENT: 37.6 %
MEAN CORPUSCULAR HEMOGLOBIN CONC: 34.5 g/dL (ref 32.0–36.0)
MEAN CORPUSCULAR HEMOGLOBIN: 33.4 pg — ABNORMAL HIGH (ref 25.9–32.4)
MEAN CORPUSCULAR VOLUME: 96.8 fL — ABNORMAL HIGH (ref 77.6–95.7)
MEAN PLATELET VOLUME: 6.8 fL (ref 6.8–10.7)
MONOCYTES ABSOLUTE COUNT: 0.7 10*9/L (ref 0.3–0.8)
MONOCYTES RELATIVE PERCENT: 14.5 %
NEUTROPHILS ABSOLUTE COUNT: 2.1 10*9/L (ref 1.8–7.8)
NEUTROPHILS RELATIVE PERCENT: 43.5 %
PLATELET COUNT: 213 10*9/L (ref 150–450)
RED BLOOD CELL COUNT: 2.49 10*12/L — ABNORMAL LOW (ref 3.95–5.13)
RED CELL DISTRIBUTION WIDTH: 22.6 % — ABNORMAL HIGH (ref 12.2–15.2)
WBC ADJUSTED: 4.7 10*9/L (ref 3.6–11.2)

## 2021-12-11 LAB — HEPATIC FUNCTION PANEL
ALBUMIN: 3.5 g/dL (ref 3.4–5.0)
ALKALINE PHOSPHATASE: 56 U/L (ref 46–116)
ALT (SGPT): 20 U/L (ref 10–49)
AST (SGOT): 23 U/L (ref <=34)
BILIRUBIN DIRECT: 0.2 mg/dL (ref 0.00–0.30)
BILIRUBIN TOTAL: 0.7 mg/dL (ref 0.3–1.2)
PROTEIN TOTAL: 6.3 g/dL (ref 5.7–8.2)

## 2021-12-11 LAB — SLIDE REVIEW

## 2021-12-11 MED ADMIN — sodium chloride (NS) 0.9 % flush 10 mL: 10 mL | INTRAVENOUS | @ 12:00:00

## 2021-12-11 MED ADMIN — methotrexate 1,608 mg in sodium chloride (NS) 0.9 % 1,000 mL IVPB: 800 mg/m2 | INTRAVENOUS | @ 16:00:00

## 2021-12-11 MED ADMIN — dextromethorphan-guaifenesin (ROBITUSSIN-DM) 2-20 mg/mL oral syrup: 5 mL | ORAL | @ 01:00:00

## 2021-12-11 MED ADMIN — acetaminophen (TYLENOL) tablet 650 mg: 650 mg | ORAL | @ 13:00:00 | Stop: 2021-12-11

## 2021-12-11 MED ADMIN — sodium chloride (NS) 0.9 % flush 10 mL: 10 mL | INTRAVENOUS | @ 01:00:00

## 2021-12-11 MED ADMIN — lidocaine (LIDODERM) 5 % patch 1 patch: 1 | TRANSDERMAL | @ 13:00:00

## 2021-12-11 MED ADMIN — methylPREDNISolone sodium succinate (PF) (SOLU-medrol) injection 50 mg: 50 mg | INTRAVENOUS | @ 12:00:00 | Stop: 2021-12-11

## 2021-12-11 MED ADMIN — albuterol (PROVENTIL HFA;VENTOLIN HFA) 90 mcg/actuation inhaler 2 puff: 2 | RESPIRATORY_TRACT | @ 23:00:00

## 2021-12-11 MED ADMIN — diclofenac sodium (VOLTAREN) 1 % gel 2 g: 2 g | TOPICAL | @ 13:00:00

## 2021-12-11 MED ADMIN — valACYclovir (VALTREX) tablet 500 mg: 500 mg | ORAL | @ 12:00:00

## 2021-12-11 MED ADMIN — dasatinib (SPRYCEL) tablet 70 mg: 70 mg | ORAL | @ 20:00:00

## 2021-12-11 MED ADMIN — sodium bicarbonate 150 mEq in dextrose 5 % 1,150 mL continuous infusion: 150 meq | INTRAVENOUS | @ 11:00:00

## 2021-12-11 MED ADMIN — ondansetron (ZOFRAN) tablet 24 mg: 24 mg | ORAL | @ 12:00:00 | Stop: 2021-12-11

## 2021-12-11 MED ADMIN — sodium bicarbonate 150 mEq in dextrose 5 % 1,150 mL continuous infusion: 150 meq | INTRAVENOUS | @ 20:00:00

## 2021-12-11 MED ADMIN — menthol (COUGH DROPS) lozenge 1 lozenge: 1 | ORAL | @ 01:00:00

## 2021-12-11 MED ADMIN — sodium bicarbonate 150 mEq in dextrose 5 % 1,150 mL continuous infusion: 150 meq | INTRAVENOUS | @ 03:00:00

## 2021-12-11 MED ADMIN — methotrexate 402 mg in sodium chloride (NS) 0.9 % 250 mL IVPB: 200 mg/m2 | INTRAVENOUS | @ 14:00:00 | Stop: 2021-12-11

## 2021-12-11 NOTE — Unmapped (Signed)
High-Dose Methotrexate Therapeutic Monitoring Pharmacy Note    Patricia [name] is a 46 y.o. year old female with PH+ ALL being treated with HyperCVAD (part B/methotrexate-containing cycle), which is a treatment protocol containing high doses of methotrexate that require supportive care.    Dosing Information:  Methotrexate Dose: 1 g/m2   Dose reduced? No  Date/Time of Initiation: 5/13 at 0959    Urinary Alkalinization Therapy: Acetazolamide 500 mg PO Q6H x 2 doses, followed by sodium bicarbonate 150 mEq in 1L D5W @ 150 mL/hr    Goals:  Methotrexate Levels  Monitor methotrexate levels with leucovorin dose adjusted per HyperCVAD protocol    Additional Clinical Monitoring/Outcomes  Maintain urine pH >/= 7 with serum CO2 </= 40  Monitor renal function (SCr and urine output)   Monitor for signs/symptoms of adverse events (e.g., myelosuppression, diarrhea, mucositis, neurotoxicity)    Results:   Lab Results   Component Value Date    CREATININE 0.68 12/11/2021     Urine pH: 8.5    Pharmacokinetic Considerations and Significant Drug Interactions:  Concurrent nephrotoxic medications: None identified  Medications that delay methotrexate clearance: None identified    Assessment/Plan:  Urinary Alkalinization  Current urine pH is at goal (>/= 7); recommend to continue current urinary alkalinization therapy    Methotrexate Level Monitoring and Leucovorin Dosing  Obtain a serum methotrexate level 24 hours after the initiation of methotrexate infusion (methotrexate level due 5/14 @ 0959). Thereafter, obtain a methotrexate level with morning labs until methotrexate level is < 0.05 micromolar.   Methotrexate levels with leucovorin dose adjusted at 24 hours as follows:  Methotrexate level < 20 micromolar, continue leucovorin dose of 50 mg IV x 1 dose, followed by 25 mg PO Q6H to begin 12 hours after completion of methotrexate infusion  Methotrexate level > 20 micromolar, increase to 50 mg IV Q6H and start immediately    Cytarabine Dose Adjustment  If the 24-hour methotrexate level is > 20 micromolar or the SCr is > 2 mg/dL, reduce the cytarabine dose to 1 g/m2   Do not initiate cytarabine until the methotrexate level is < 20 micromolar    Follow-up  Please avoid using proton pump inhibitors, phenytoin, folic acid, trimethoprim/sulfamethoxazole, NSAIDs, aminoglycosides, and penicillins. Use ACE inhibitors, ARBs, fluoroquinolones and other beta lactam antibiotics with caution.   A pharmacist will continue to monitor and recommend supportive care as appropriate    Please page service pharmacist with questions/clarifications.    Elza Rafter D  PGY-2 Oncology Resident

## 2021-12-11 NOTE — Unmapped (Signed)
Pt's A&O x4, VSS, afebrile, PRN oxy x1 given for pain at the port site, PRN robitussin x1 & menthol x1 given for cough with improving, continuous sodium bicarb@150  mL/hr running, adequate UOP, no BM, no acute changes overnight.      Problem: Adult Inpatient Plan of Care  Goal: Plan of Care Review  Outcome: Ongoing - Unchanged  Goal: Patient-Specific Goal (Individualized)  Outcome: Ongoing - Unchanged  Goal: Absence of Hospital-Acquired Illness or Injury  Outcome: Ongoing - Unchanged  Intervention: Identify and Manage Fall Risk  Recent Flowsheet Documentation  Taken 12/10/2021 2100 by Sharlee Blew, RN  Safety Interventions:   lighting adjusted for tasks/safety   low bed   fall reduction program maintained   nonskid shoes/slippers when out of bed  Intervention: Prevent Skin Injury  Recent Flowsheet Documentation  Taken 12/10/2021 2100 by Sharlee Blew, RN  Skin Protection: adhesive use limited  Intervention: Prevent and Manage VTE (Venous Thromboembolism) Risk  Recent Flowsheet Documentation  Taken 12/10/2021 2100 by Sharlee Blew, RN  Activity Management: activity adjusted per tolerance  Intervention: Prevent Infection  Recent Flowsheet Documentation  Taken 12/10/2021 2100 by Sharlee Blew, RN  Infection Prevention: cohorting utilized  Goal: Optimal Comfort and Wellbeing  Outcome: Ongoing - Unchanged  Goal: Readiness for Transition of Care  Outcome: Ongoing - Unchanged  Goal: Rounds/Family Conference  Outcome: Ongoing - Unchanged

## 2021-12-11 NOTE — Unmapped (Signed)
Malignant Hematology APP Daily Progress Note    Attending Physician :  Providence Crosby Dittu*  Accepting Service  : Oncology/Hematology (MDE)  Reason for Admission: scheduled chemo     Problem List:   Patient Active Problem List   Diagnosis    Leukocytosis    B-cell acute lymphoblastic leukemia (ALL) (CMS-HCC)    Acute cough    Dyspepsia    Acute frontal sinusitis       Assessment/Plan: Patricia [name] is an 46 y.o. 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      Ph+ALL, in remission: Initially diagnosed in 09/2021 and underwent [id number] induction complicated by PICC line associated superficial thrombus. BMBx on 5/2 revealed morphologic CR. Admitted for Cycle 2 HyperCVAD + Dasatinib. Patient has completed #3 IT chemos prior to admission.   - D1=5/12 of HyperCVAD (Methotrexate/Cytarabine)  - Dasatinib to 70 mg on admission  - IT Cytarabine to be given on 5/15 - orders/chemo clarification placed      cRegimen: R-HyperCVAD  Cycle # 2  Primary Oncologist: Senaida Ores     Even Cycles  Date             Day 1 2 3 4 5    Methotrexate IV 1000mg /m2 x           Cytarabine 3g/m2 Q12H   X  x X  x       Cytarabine IT        x        EVEN CYCLE DETAILS  - If age <94 yo will need 24 hours MTX level   - bicarb continuous infusion until MTX level <0.05  - Leucovorin titration until MTX level <0.05   - daily UA to maintain pH >/= 7.0         Disposition EVEN:  - Neulasta on ~5/17 - requested   - Twice weekly labs - scheduled   - IT Methotrexate on 5/19 - requested   - Follow with Dr. Trula Ore - requested      Grade 1 peripheral neuropathy: Paraesthesia present primarily in her fingers, currently without medical interventions continue to monitor while on therapy.  - CTM       Anxiety: Reports taking Lexapro 10 mg at home and Ativan 0.5 mg PO TID PRN. Patient would like to hold off on Lexapro during admission has she has been holding this medicine at home.   - Ativan PO TID PRN.     Acute on Chronic back pain, upper and lower back  - PRN oxycodone  - Lidocaine patch     Headaches  - PRN Esgisic     Resent History acute sinusitis: Completed augmentin 875-125mg  PO BID x 7(5/1-5/8). Symptoms improving.   - CMT       Psychosocial distress: She reports a moderate level of anxiety regarding the management of the above.   - Counseling given   - Consider ref to comprehensive cancer support program      Cancer related fatigue:  Patient endorses fatigue with onset of cancer symptoms or treatment.      Immunocompromised status: Patient is immunocompromised secondary to disease or chemotherapy  -Antimicrobial prophylaxis as above     Impending Electrolyte Abnormality Secondary to Chemotherapy and/or IV Fluids  -Daily Electrolyte monitoring  -Replete per [facility] guidelines.      Impending Pancytopenia secondary to Acute Leukemia/chemotherapy:   -  Transfuse 1 unit of pRBCs for hgb >7  - Transfuse 1 unit plt for plts >10K       Subjective:   [name]. AFVSS. Ongoing back pain added lidocaine patch. No other acute concerns     10 point ROS otherwise negative except as above in the HPI.        Objective:   Vitals: Temp:  [36.7 ??C (98.1 ??F)-37.1 ??C (98.8 ??F)] 36.8 ??C (98.2 ??F)  Heart Rate:  [87-95] 95  Resp:  [16-19] 16  BP: (113-137)/(71-92) 120/84  MAP (mmHg):  [85-95] 95  SpO2:  [97 %-99 %] 98 %  BMI (Calculated):  [28.14] 28.14    Physical Exam: unchanged from 12/10/21 exam  General: Resting, in no apparent distress, lying in bed  HEENT:  PERRL. No scleral icterus or conjunctival injection. MMM without ulceration, erythema or exudate. Heart:  RRR. S1, S2. No murmurs, gallops, or rubs.  Lungs:  Breathing is unlabored, and patient is speaking full sentences with ease.  No stridor.  CTAB. No rales, ronchi, or crackles.    Abdomen:  No distention or pain on palpation.  Bowel sounds are present and normoactive x 4.  No palpable hepatomegaly or splenomegaly.  No palpable masses.  Skin:  No rashes, petechiae or purpura.  No areas of skin breakdown. Warm to touch, dry, smooth, and even.  Musculoskeletal:  No grossly-evident joint effusions or deformities.  Range of motion about the shoulder, elbow, hips and knees is grossly normal.  Psychiatric:  Range of affect is appropriate.    Neurologic:  Alert and oriented to person, place, time and situation.  Gait is normal.  No Nystagmus Cerebellar tasks (finger-to-nose, rapid hand movement, heel along shin) are completed with ease and are symmetric. CNII-CNXII grossly intact.  Extremities:  Appear well-perfused. No clubbing, edema, or cyanosis.  CVAD: R CW Port - no erythema, nontender; dressing CDI.      Test Results  Recent Labs     12/10/21  1058 12/11/21  0446   WBC 4.2 4.7   NEUTROABS 2.3 2.1   HGB 8.3* 8.3*   PLT 233 213     Recent Labs     12/10/21  1058 12/11/21  0446   NA 140 140   K 3.8 3.6   CL 106 107   CO2 27.0 24.0   BUN 13 10   CREATININE 0.62 0.68   CALCIUM 9.5 8.4*       [name],  12/11/2021  9:27 AM   Hematology/Oncology Department   [facility]   Group Pager: [phone]

## 2021-12-12 LAB — URINALYSIS WITH MICROSCOPY
BILIRUBIN UA: NEGATIVE
BLOOD UA: NEGATIVE
GLUCOSE UA: NEGATIVE
KETONES UA: NEGATIVE
LEUKOCYTE ESTERASE UA: NEGATIVE
NITRITE UA: NEGATIVE
PH UA: 8 (ref 5.0–9.0)
RBC UA: <1 /HPF (ref <=4)
SPECIFIC GRAVITY UA: 1.009 (ref 1.003–1.030)
SQUAMOUS EPITHELIAL: 1 /HPF (ref 0–5)
UROBILINOGEN UA: <2
WBC UA: <1 /HPF (ref 0–5)

## 2021-12-12 LAB — CBC W/ AUTO DIFF
BASOPHILS ABSOLUTE COUNT: 0 10*9/L (ref 0.0–0.1)
BASOPHILS RELATIVE PERCENT: 0.1 %
EOSINOPHILS ABSOLUTE COUNT: 0 10*9/L (ref 0.0–0.5)
EOSINOPHILS RELATIVE PERCENT: 0 %
HEMATOCRIT: 23.7 % — ABNORMAL LOW (ref 34.0–44.0)
HEMOGLOBIN: 8.3 g/dL — ABNORMAL LOW (ref 11.3–14.9)
LYMPHOCYTES ABSOLUTE COUNT: 1.1 10*9/L (ref 1.1–3.6)
LYMPHOCYTES RELATIVE PERCENT: 18.8 %
MEAN CORPUSCULAR HEMOGLOBIN CONC: 34.8 g/dL (ref 32.0–36.0)
MEAN CORPUSCULAR HEMOGLOBIN: 33.3 pg — ABNORMAL HIGH (ref 25.9–32.4)
MEAN CORPUSCULAR VOLUME: 95.6 fL (ref 77.6–95.7)
MEAN PLATELET VOLUME: 7.2 fL (ref 6.8–10.7)
MONOCYTES ABSOLUTE COUNT: 0.2 10*9/L — ABNORMAL LOW (ref 0.3–0.8)
MONOCYTES RELATIVE PERCENT: 4 %
NEUTROPHILS ABSOLUTE COUNT: 4.3 10*9/L (ref 1.8–7.8)
NEUTROPHILS RELATIVE PERCENT: 77.1 %
PLATELET COUNT: 216 10*9/L (ref 150–450)
RED BLOOD CELL COUNT: 2.48 10*12/L — ABNORMAL LOW (ref 3.95–5.13)
RED CELL DISTRIBUTION WIDTH: 21.7 % — ABNORMAL HIGH (ref 12.2–15.2)
WBC ADJUSTED: 5.6 10*9/L (ref 3.6–11.2)

## 2021-12-12 LAB — BASIC METABOLIC PANEL
ANION GAP: 9 mmol/L (ref 5–14)
BLOOD UREA NITROGEN: 8 mg/dL — ABNORMAL LOW (ref 9–23)
BUN / CREAT RATIO: 14
CALCIUM: 9.2 mg/dL (ref 8.7–10.4)
CHLORIDE: 111 mmol/L — ABNORMAL HIGH (ref 98–107)
CO2: 22 mmol/L (ref 20.0–31.0)
CREATININE: 0.59 mg/dL — ABNORMAL LOW
EGFR CKD-EPI (2021) FEMALE: >90 mL/min/{1.73_m2} (ref >=60)
GLUCOSE RANDOM: 148 mg/dL (ref 70–179)
POTASSIUM: 3.7 mmol/L (ref 3.4–4.8)
SODIUM: 142 mmol/L (ref 135–145)

## 2021-12-12 LAB — HEPATIC FUNCTION PANEL
ALBUMIN: 3.8 g/dL (ref 3.4–5.0)
ALKALINE PHOSPHATASE: 57 U/L (ref 46–116)
ALT (SGPT): 22 U/L (ref 10–49)
AST (SGOT): 23 U/L (ref <=34)
BILIRUBIN DIRECT: 0.2 mg/dL (ref 0.00–0.30)
BILIRUBIN TOTAL: 0.9 mg/dL (ref 0.3–1.2)
PROTEIN TOTAL: 6.8 g/dL (ref 5.7–8.2)

## 2021-12-12 LAB — METHOTREXATE LEVEL: METHOTREXATE LEVEL: 17.75 umol/L

## 2021-12-12 MED ADMIN — sodium chloride (NS) 0.9 % flush 10 mL: 10 mL | INTRAVENOUS | @ 01:00:00

## 2021-12-12 MED ADMIN — methylPREDNISolone sodium succinate (PF) (SOLU-medrol) injection 50 mg: 50 mg | INTRAVENOUS | @ 19:00:00 | Stop: 2021-12-14

## 2021-12-12 MED ADMIN — phenoL (CHLORASEPTIC) 1.4 % spray 2 spray: 2 | @ 15:00:00

## 2021-12-12 MED ADMIN — methylPREDNISolone sodium succinate (PF) (SOLU-medrol) injection 50 mg: 50 mg | INTRAVENOUS | @ 01:00:00 | Stop: 2021-12-11

## 2021-12-12 MED ADMIN — cytarabine (PF) (ARA-C) 6,030 mg in sodium chloride (NS) 0.9 % 250 mL IVPB: 3 g/m2 | INTRAVENOUS | @ 20:00:00 | Stop: 2021-12-14

## 2021-12-12 MED ADMIN — albuterol (PROVENTIL HFA;VENTOLIN HFA) 90 mcg/actuation inhaler 2 puff: 2 | RESPIRATORY_TRACT | @ 11:00:00

## 2021-12-12 MED ADMIN — valACYclovir (VALTREX) tablet 500 mg: 500 mg | ORAL | @ 14:00:00

## 2021-12-12 MED ADMIN — sodium bicarbonate 150 mEq in dextrose 5 % 1,150 mL continuous infusion: 150 meq | INTRAVENOUS | @ 14:00:00

## 2021-12-12 MED ADMIN — sodium chloride (NS) 0.9 % flush 10 mL: 10 mL | INTRAVENOUS | @ 14:00:00

## 2021-12-12 MED ADMIN — polyethylene glycol (MIRALAX) packet 17 g: 17 g | ORAL | @ 14:00:00

## 2021-12-12 MED ADMIN — lidocaine (LIDODERM) 5 % patch 1 patch: 1 | TRANSDERMAL | @ 16:00:00

## 2021-12-12 MED ADMIN — dasatinib (SPRYCEL) tablet 70 mg: 70 mg | ORAL | @ 21:00:00

## 2021-12-12 MED ADMIN — ondansetron (ZOFRAN) tablet 24 mg: 24 mg | ORAL | @ 19:00:00 | Stop: 2021-12-14

## 2021-12-12 MED ADMIN — albuterol (PROVENTIL HFA;VENTOLIN HFA) 90 mcg/actuation inhaler 2 puff: 2 | RESPIRATORY_TRACT | @ 05:00:00

## 2021-12-12 MED ADMIN — cetirizine (ZyrTEC) tablet 10 mg: 10 mg | ORAL | @ 01:00:00

## 2021-12-12 MED ADMIN — sodium bicarbonate 150 mEq in dextrose 5 % 1,150 mL continuous infusion: 150 meq | INTRAVENOUS | @ 21:00:00

## 2021-12-12 MED ADMIN — sodium bicarbonate 150 mEq in dextrose 5 % 1,150 mL continuous infusion: 150 meq | INTRAVENOUS | @ 05:00:00

## 2021-12-12 NOTE — Unmapped (Signed)
Malignant Hematology APP Daily Progress Note    Attending Physician :  Providence Crosby Dittu*  Accepting Service  : Oncology/Hematology (MDE)  Reason for Admission: scheduled chemo     Problem List:   Patient Active Problem List   Diagnosis    Leukocytosis    B-cell acute lymphoblastic leukemia (ALL) (CMS-HCC)    Acute cough    Dyspepsia    Acute frontal sinusitis       Assessment/Plan: Patricia [name] is an 46 y.o. 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      Ph+ALL, in remission: Initially diagnosed in 09/2021 and underwent [id number] induction complicated by PICC line associated superficial thrombus. BMBx on 5/2 revealed morphologic CR. Admitted for Cycle 2 HyperCVAD + Dasatinib. Patient has completed #3 IT chemos prior to admission.   - D1=5/12 of HyperCVAD (Methotrexate/Cytarabine)  - Dasatinib to 70 mg on admission  - IT Cytarabine to be given on 5/15 - orders/chemo clarification placed      cRegimen: R-HyperCVAD. 5/14 = Day 3  Cycle # 2  Primary Oncologist: Senaida Ores     Even Cycles  Date  5/12           Day 1 2 3 4 5    Methotrexate IV 1000mg /m2 x           Cytarabine 3g/m2 Q12H   X  x X  x       Cytarabine IT        x        EVEN CYCLE DETAILS  - If age <72 yo will need 24 hours MTX level   - bicarb continuous infusion until MTX level <0.05  - Leucovorin titration until MTX level <0.05   - daily UA to maintain pH >/= 7.0         Disposition EVEN:  - Neulasta on ~5/17 - requested   - Twice weekly labs - scheduled   - IT Methotrexate on 5/19 - requested   - Follow with Dr. Trula Ore - requested      Grade 1 peripheral neuropathy: Paraesthesia present primarily in her fingers, currently without medical interventions continue to monitor while on therapy.  - CTM       Anxiety: Reports taking Lexapro 10 mg at home and Ativan 0.5 mg PO TID PRN. Patient would like to hold off on Lexapro during admission has she has been holding this medicine at home.   - Ativan PO TID PRN.     Acute on Chronic back pain, upper and lower back  - PRN oxycodone  - Lidocaine patch     Dry Cough: CXR 5/13 unimpressive. Pt states r/t allergies, congestion. Taking zyrtec. Flonase causes sinus infection. Added chlorseptic.     Headaches  - PRN Esgisic     Resent History acute sinusitis: Completed augmentin 875-125mg  PO BID x 7(5/1-5/8). Symptoms improving.   - CMT       Psychosocial distress: She reports a moderate level of anxiety regarding the management of the above.   - Counseling given   - Consider ref to comprehensive cancer support program      Cancer related fatigue:  Patient endorses fatigue with onset of cancer symptoms or treatment.      Immunocompromised status: Patient is immunocompromised secondary to disease or chemotherapy  -Antimicrobial prophylaxis as above     Impending Electrolyte Abnormality Secondary  to Chemotherapy and/or IV Fluids  -Daily Electrolyte monitoring  -Replete per [facility] guidelines.      Impending Pancytopenia secondary to Acute Leukemia/chemotherapy:   - Transfuse 1 unit of pRBCs for hgb >7  - Transfuse 1 unit plt for plts >10K       Subjective:   [name]. AFVSS. Endorsing dry cough from allergies used nebulizer prn with improvement. Having some nasal congestion and dry tickle in throat. Usually responds to chlorseptic spray     10 point ROS otherwise negative except as above in the HPI.        Objective:   Vitals: Temp:  [36.8 ??C (98.2 ??F)-37.7 ??C (99.9 ??F)] 36.9 ??C (98.4 ??F)  Heart Rate:  [87-107] 87  Resp:  [16-18] 18  BP: (106-126)/(70-85) 111/72  MAP (mmHg):  [81-97] 84  SpO2:  [97 %-100 %] 97 %    Physical Exam: unchanged from 12/10/21 exam  General: Resting, in no apparent distress, lying in bed  HEENT:  PERRL. No scleral icterus or conjunctival injection. MMM without ulceration, erythema or exudate. Heart:  RRR. S1, S2. No murmurs, gallops, or rubs.  Lungs:  Breathing is unlabored, and patient is speaking full sentences with ease.  No stridor.  CTAB.  No wheezing or crackles bilaterally  Abdomen:  No distention or pain on palpation.  Bowel sounds are present and normoactive x 4.  No palpable hepatomegaly or splenomegaly.  No palpable masses.  Skin:  No rashes, petechiae or purpura.  No areas of skin breakdown. Warm to touch, dry, smooth, and even.  Musculoskeletal:  No grossly-evident joint effusions or deformities.  Range of motion about the shoulder, elbow, hips and knees is grossly normal.  Psychiatric:  Range of affect is appropriate.    Neurologic:  Alert and oriented to person, place, time and situation.  Gait is normal.  No Nystagmus Cerebellar tasks (finger-to-nose, rapid hand movement, heel along shin) are completed with ease and are symmetric. CNII-CNXII grossly intact.  Extremities:  Appear well-perfused. No clubbing, edema, or cyanosis.  CVAD: R CW Port - no erythema, nontender; dressing CDI.      Test Results  Recent Labs     12/10/21  1058 12/11/21  0446 12/12/21  0058   WBC 4.2 4.7 5.6   NEUTROABS 2.3 2.1 4.3   HGB 8.3* 8.3* 8.3*   PLT 233 213 216       Recent Labs     12/10/21  1058 12/11/21  0446 12/12/21  0058   NA 140 140 142   K 3.8 3.6 3.7   CL 106 107 111*   CO2 27.0 24.0 22.0   BUN 13 10 8*   CREATININE 0.62 0.68 0.59*   CALCIUM 9.5 8.4* 9.2         [name],  12/12/2021  8:11 AM   Hematology/Oncology Department   [facility]   Group Pager: [phone]

## 2021-12-12 NOTE — Unmapped (Signed)
High-Dose Methotrexate Therapeutic Monitoring Pharmacy Note    Patricia [name] is a 46 y.o. 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    Dosing Information:  Methotrexate Dose: 1 g/m2   Dose reduced? No  Date/Time of Initiation: 5/13 at 0959    Goals:  Methotrexate Levels  Monitor methotrexate levels with leucovorin dose adjusted per HyperCAVD protocol    Additional Clinical Monitoring/Outcomes  Maintain urine pH >/= 7 with serum CO2 </= 40  Monitor renal function (SCr and urine output)  Monitor for signs/symptoms of adverse events (e.g., myelosuppression, diarrhea, mucositis, neurotoxicity)    Results:   Methotrexate Levels    Level (micromolar) Hours   Date Time   17.75 25 12/12/21 1100     Lab Results   Component Value Date    CREATININE 0.59 (L) 12/12/2021     Serum CO2: 22 mmol/L  Urine pH: 8.0    Pharmacokinetic Considerations and Significant Drug Interactions:  Concurrent nephrotoxic medications: Not applicable  Medications that delay methotrexate clearance: None identified    Assessment/Plan:  Urinary Alkalinization  Current urine pH is at goal (>/= 7); recommend to continue current urinary alkalinization therapy    Methotrexate Level Monitoring and Leucovorin Dosing  Methotrexate level within range according to HyperCVAD protocol. Notably, level today was ~ 1 hour late, though unlikely that true 24 hour level is > 20 micromolar. Scr remains WNL, no other signs or symptoms of MTX toxicity at this time  Recommend to continue current leucovorin dose of 50 mg x 1, then 25 mg PO Q6H to begin  12 hours after completion of methotrexate infusion    Cytarabine Dose Adjustment  Recommend to continue cytarabine at a dose of 3 g/m2    Follow-up  Continue to obtain a methotrexate level with morning labs until methotrexate level is < 0.05 micromolar  Please avoid using proton pump inhibitors, phenytoin, folic acid, trimethoprim/sulfamethoxazole, NSAIDs, aminoglycosides, and penicillins. Use ACE inhibitors, ARBs, fluoroquinolones and other beta lactam antibiotics with caution.   A pharmacist will continue to monitor and recommend supportive care as appropriate    Please page service pharmacist with questions/clarifications.    Elza Rafter D  PGY-2 Oncology Resident

## 2021-12-12 NOTE — Unmapped (Signed)
C2D2 HyperCVAD. VSS and afebrile. Sodium bicarb at 161ml/hr. PRN voltaren gel applied and lidocaine patch applied for head/neck pain and tylenol given once. Pt educated on CHG use. Chemo and neutropenic precautions maintained.  No acute events.     Problem: Adult Inpatient Plan of Care  Goal: Plan of Care Review  Outcome: Progressing  Goal: Patient-Specific Goal (Individualized)  Outcome: Progressing  Goal: Absence of Hospital-Acquired Illness or Injury  Outcome: Progressing  Intervention: Identify and Manage Fall Risk  Recent Flowsheet Documentation  Taken 12/11/2021 1310 by Vaughan Browner, RN  Safety Interventions: chemotherapeutic agent precautions  Taken 12/11/2021 1000 by Vaughan Browner, RN  Safety Interventions: chemotherapeutic agent precautions  Taken 12/11/2021 0840 by Vaughan Browner, RN  Safety Interventions:   chemotherapeutic agent precautions   lighting adjusted for tasks/safety   low bed   nonskid shoes/slippers when out of bed  Intervention: Prevent Skin Injury  Recent Flowsheet Documentation  Taken 12/11/2021 0840 by Vaughan Browner, RN  Skin Protection: adhesive use limited  Intervention: Prevent and Manage VTE (Venous Thromboembolism) Risk  Recent Flowsheet Documentation  Taken 12/11/2021 0840 by Vaughan Browner, RN  Activity Management: activity adjusted per tolerance  Intervention: Prevent Infection  Recent Flowsheet Documentation  Taken 12/11/2021 0840 by Vaughan Browner, RN  Infection Prevention: cohorting utilized  Goal: Optimal Comfort and Wellbeing  Outcome: Progressing  Goal: Readiness for Transition of Care  Outcome: Progressing  Goal: Rounds/Family Conference  Outcome: Progressing

## 2021-12-12 NOTE — Unmapped (Signed)
Pt's A&O x4, VSS, afebrile, c/o dry cough due to allergy but declined intervention, PRN albuterol used for wheezing with improving, no wheezing on auscultation, methotrexate running for 22 hrs and continuous sodium bicarb@150  mL/hr running, Q 4 hrs blood return checked, adequate UOP, no acute changes overnight.       Problem: Adult Inpatient Plan of Care  Goal: Plan of Care Review  Outcome: Ongoing - Unchanged  Goal: Patient-Specific Goal (Individualized)  Outcome: Ongoing - Unchanged  Goal: Absence of Hospital-Acquired Illness or Injury  Outcome: Ongoing - Unchanged  Intervention: Identify and Manage Fall Risk  Recent Flowsheet Documentation  Taken 12/11/2021 2030 by Sharlee Blew, RN  Safety Interventions:   chemotherapeutic agent precautions   fall reduction program maintained   lighting adjusted for tasks/safety   low bed   nonskid shoes/slippers when out of bed  Intervention: Prevent Skin Injury  Recent Flowsheet Documentation  Taken 12/11/2021 2030 by Sharlee Blew, RN  Skin Protection: adhesive use limited  Intervention: Prevent and Manage VTE (Venous Thromboembolism) Risk  Recent Flowsheet Documentation  Taken 12/11/2021 2030 by Sharlee Blew, RN  Activity Management: activity adjusted per tolerance  Intervention: Prevent Infection  Recent Flowsheet Documentation  Taken 12/11/2021 2030 by Sharlee Blew, RN  Infection Prevention: cohorting utilized  Goal: Optimal Comfort and Wellbeing  Outcome: Ongoing - Unchanged  Goal: Readiness for Transition of Care  Outcome: Ongoing - Unchanged  Goal: Rounds/Family Conference  Outcome: Ongoing - Unchanged

## 2021-12-13 LAB — BASIC METABOLIC PANEL
ANION GAP: 8 mmol/L (ref 5–14)
BLOOD UREA NITROGEN: 10 mg/dL (ref 9–23)
BUN / CREAT RATIO: 17
CALCIUM: 8.4 mg/dL — ABNORMAL LOW (ref 8.7–10.4)
CHLORIDE: 109 mmol/L — ABNORMAL HIGH (ref 98–107)
CO2: 26 mmol/L (ref 20.0–31.0)
CREATININE: 0.59 mg/dL — ABNORMAL LOW
EGFR CKD-EPI (2021) FEMALE: >90 mL/min/{1.73_m2} (ref >=60)
GLUCOSE RANDOM: 135 mg/dL (ref 70–179)
POTASSIUM: 3.8 mmol/L (ref 3.4–4.8)
SODIUM: 143 mmol/L (ref 135–145)

## 2021-12-13 LAB — CBC W/ AUTO DIFF
BASOPHILS ABSOLUTE COUNT: 0 10*9/L (ref 0.0–0.1)
BASOPHILS RELATIVE PERCENT: 0.1 %
EOSINOPHILS ABSOLUTE COUNT: 0 10*9/L (ref 0.0–0.5)
EOSINOPHILS RELATIVE PERCENT: 0 %
HEMATOCRIT: 23.1 % — ABNORMAL LOW (ref 34.0–44.0)
HEMOGLOBIN: 8 g/dL — ABNORMAL LOW (ref 11.3–14.9)
LYMPHOCYTES ABSOLUTE COUNT: 0.4 10*9/L — ABNORMAL LOW (ref 1.1–3.6)
LYMPHOCYTES RELATIVE PERCENT: 6.6 %
MEAN CORPUSCULAR HEMOGLOBIN CONC: 34.7 g/dL (ref 32.0–36.0)
MEAN CORPUSCULAR HEMOGLOBIN: 33.2 pg — ABNORMAL HIGH (ref 25.9–32.4)
MEAN CORPUSCULAR VOLUME: 95.8 fL — ABNORMAL HIGH (ref 77.6–95.7)
MEAN PLATELET VOLUME: 7 fL (ref 6.8–10.7)
MONOCYTES ABSOLUTE COUNT: 0.2 10*9/L — ABNORMAL LOW (ref 0.3–0.8)
MONOCYTES RELATIVE PERCENT: 2.8 %
NEUTROPHILS ABSOLUTE COUNT: 5.7 10*9/L (ref 1.8–7.8)
NEUTROPHILS RELATIVE PERCENT: 90.5 %
PLATELET COUNT: 188 10*9/L (ref 150–450)
RED BLOOD CELL COUNT: 2.41 10*12/L — ABNORMAL LOW (ref 3.95–5.13)
RED CELL DISTRIBUTION WIDTH: 22 % — ABNORMAL HIGH (ref 12.2–15.2)
WBC ADJUSTED: 6.3 10*9/L (ref 3.6–11.2)

## 2021-12-13 LAB — URINALYSIS WITH MICROSCOPY
BILIRUBIN UA: NEGATIVE
BLOOD UA: NEGATIVE
GLUCOSE UA: NEGATIVE
KETONES UA: NEGATIVE
LEUKOCYTE ESTERASE UA: NEGATIVE
NITRITE UA: NEGATIVE
PH UA: 8 (ref 5.0–9.0)
PROTEIN UA: NEGATIVE
RBC UA: <1 /HPF (ref <=4)
SPECIFIC GRAVITY UA: 1.012 (ref 1.003–1.030)
SQUAMOUS EPITHELIAL: <1 /HPF (ref 0–5)
TRANSITIONAL EPITHELIAL: <1 /HPF (ref 0–2)
UROBILINOGEN UA: <2
WBC UA: <1 /HPF (ref 0–5)

## 2021-12-13 LAB — METHOTREXATE LEVEL: METHOTREXATE LEVEL: 0.25 umol/L

## 2021-12-13 MED ORDER — DASATINIB 70 MG TABLET
ORAL_TABLET | Freq: Every day | ORAL | 6 refills | 60 days
Start: 2021-12-13 — End: ?

## 2021-12-13 MED ORDER — DICLOFENAC 1 % TOPICAL GEL
Freq: Four times a day (QID) | TOPICAL | 0 refills | 13 days | PRN
Start: 2021-12-13 — End: 2022-12-13

## 2021-12-13 MED ORDER — LIDOCAINE 5 % TOPICAL PATCH
MEDICATED_PATCH | Freq: Every day | TRANSDERMAL | 0 refills | 30 days
Start: 2021-12-13 — End: 2022-01-12

## 2021-12-13 MED ORDER — DAPSONE 100 MG TABLET
ORAL_TABLET | Freq: Every day | ORAL | 2 refills | 30 days
Start: 2021-12-13 — End: 2022-03-13

## 2021-12-13 MED ORDER — FLUCONAZOLE 100 MG TABLET
ORAL_TABLET | Freq: Every day | ORAL | 0 refills | 30 days
Start: 2021-12-13 — End: 2022-01-12

## 2021-12-13 MED ORDER — PREDNISOLONE ACETATE 1 % EYE DROPS,SUSPENSION
Freq: Four times a day (QID) | OPHTHALMIC | 0 refills | 8 days
Start: 2021-12-13 — End: 2021-12-20

## 2021-12-13 MED ADMIN — valACYclovir (VALTREX) tablet 500 mg: 500 mg | ORAL | @ 13:00:00

## 2021-12-13 MED ADMIN — sodium bicarbonate 150 mEq in dextrose 5 % 1,150 mL continuous infusion: 150 meq | INTRAVENOUS | @ 06:00:00

## 2021-12-13 MED ADMIN — sodium bicarbonate 150 mEq in dextrose 5 % 1,150 mL continuous infusion: 150 meq | INTRAVENOUS | @ 22:00:00

## 2021-12-13 MED ADMIN — leuCOVorin (WELLCOVORIN) tablet 25 mg: 25 mg | ORAL | @ 13:00:00

## 2021-12-13 MED ADMIN — LORazepam (ATIVAN) tablet 0.5 mg: .5 mg | ORAL | @ 18:00:00 | Stop: 2021-12-13

## 2021-12-13 MED ADMIN — cetirizine (ZyrTEC) tablet 10 mg: 10 mg | ORAL | @ 02:00:00

## 2021-12-13 MED ADMIN — levoFLOXacin (LEVAQUIN) tablet 750 mg: 750 mg | ORAL | @ 20:00:00 | Stop: 2021-12-20

## 2021-12-13 MED ADMIN — cytarabine (PF) (ARA-C) 100 mg, hydrocortisone sod succ (Solu-CORTEF) 50 mg in sodium chloride (NS) 0.9 % 4 mL INTRATHECAL syringe: INTRATHECAL | @ 18:00:00 | Stop: 2021-12-13

## 2021-12-13 MED ADMIN — prednisoLONE acetate (PRED FORTE) 1 % ophthalmic suspension 2 drop: 2 [drp] | OPHTHALMIC | @ 09:00:00

## 2021-12-13 MED ADMIN — prednisoLONE acetate (PRED FORTE) 1 % ophthalmic suspension 2 drop: 2 [drp] | OPHTHALMIC | @ 02:00:00

## 2021-12-13 MED ADMIN — methylPREDNISolone sodium succinate (PF) (SOLU-medrol) injection 50 mg: 50 mg | INTRAVENOUS | @ 07:00:00 | Stop: 2021-12-14

## 2021-12-13 MED ADMIN — ondansetron (ZOFRAN) tablet 24 mg: 24 mg | ORAL | @ 20:00:00 | Stop: 2021-12-13

## 2021-12-13 MED ADMIN — cytarabine (PF) (ARA-C) 6,030 mg in sodium chloride (NS) 0.9 % 250 mL IVPB: 3 g/m2 | INTRAVENOUS | @ 08:00:00 | Stop: 2021-12-14

## 2021-12-13 MED ADMIN — sodium chloride (NS) 0.9 % flush 10 mL: 10 mL | INTRAVENOUS | @ 02:00:00

## 2021-12-13 MED ADMIN — dasatinib (SPRYCEL) tablet 70 mg: 70 mg | ORAL | @ 20:00:00

## 2021-12-13 MED ADMIN — leucovorin syringe: 50 mg | INTRAVENOUS | @ 03:00:00 | Stop: 2021-12-12

## 2021-12-13 MED ADMIN — prednisoLONE acetate (PRED FORTE) 1 % ophthalmic suspension 2 drop: 2 [drp] | OPHTHALMIC | @ 17:00:00

## 2021-12-13 MED ADMIN — leuCOVorin (WELLCOVORIN) tablet 25 mg: 25 mg | ORAL | @ 09:00:00

## 2021-12-13 MED ADMIN — leuCOVorin (WELLCOVORIN) tablet 25 mg: 25 mg | ORAL | @ 22:00:00

## 2021-12-13 MED ADMIN — guaiFENesin (ROBITUSSIN) oral syrup: 200 mg | ORAL | @ 15:00:00 | Stop: 2021-12-13

## 2021-12-13 MED ADMIN — sodium chloride (NS) 0.9 % flush 10 mL: 10 mL | INTRAVENOUS | @ 13:00:00

## 2021-12-13 MED ADMIN — sodium bicarbonate 150 mEq in dextrose 5 % 1,150 mL continuous infusion: 150 meq | INTRAVENOUS | @ 14:00:00

## 2021-12-13 MED ADMIN — cytarabine (PF) (ARA-C) 6,030 mg in sodium chloride (NS) 0.9 % 250 mL IVPB: 3 g/m2 | INTRAVENOUS | @ 20:00:00 | Stop: 2021-12-14

## 2021-12-13 MED ADMIN — methylPREDNISolone sodium succinate (PF) (SOLU-medrol) injection 50 mg: 50 mg | INTRAVENOUS | @ 20:00:00 | Stop: 2021-12-14

## 2021-12-13 NOTE — Unmapped (Addendum)
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 She was admitted for C2 R-HyperCVAD (1B), D1=12/10/21. She tolerated therapy well. She cleared her MTX on D5. She underwent her 4th IT chemotherapy (Ara-C) on 5/16, hematopathology was pending on discharge. She will continue Dasatanib 70 mg daily (confirmed she had adequate supply at home). She will continue Prednisone eye drops x 7 days. She will discharge on Levaquin course for recurrent sinus infection through 5/21 then will start ppx dosing, Fluconazole, Valtrex and Dapsone ppx (given hx Sulfa allergy). She will return on 5/19 for GCS-F, IT chemotherapy and will begin labs 2x/weekly with support. She will return on 6/1 to see [name], NP and also has a visit with Palliative Care (Dr. Genice Rouge) for management of her chronic back/shoulder pain. Her detailed hospital course and disposition is outlined below.    Disposition:  - 5/19 GCS-F/IT Chemotherapy and Labs 2x/weekly beginning  - 6/1 Follow Up with Palliative Care (Dr. 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 BMBx on 5/2 revealed morphologic CR with likely small residual MRD (+) disease and BCR-ABL p190 level of 2 (27,301 prior). She has completed 3 IT treatments to date, all negative for disease. She was admitted for Cycle 2 R-HyperCVAD (1B).  She tolerated therapy well. She cleared her MTX on D5. She underwent her 4th IT chemotherapy (Ara-C) on 5/16, hematopathology was pending on discharge. She will continue Dasatanib 70 mg daily (confirmed she had adequate supply at home). She will continue Prednisone eye drops x 7 days. She will discharge on Levaquin course for recurrent sinus infection as noted below, Fluconazole, Valtrex and Dapsone ppx (given hx Sulfa allergy). She will return on 5/19 for GCS-F, IT chemotherapy and will begin labs 2x/weekly with support. She will return on 6/1 to see [name], NP and also has a visit with Palliative Care (Dr. Genice Rouge) for management of her chronic back/shoulder pain.     Regimen: R-HyperCVAD (1B)  Cycle # 2  Primary Oncologist: Dr. Senaida Ores     Even Cycles  Date  5/12           Day 1 2 3 4 5    Methotrexate IV 1000mg /m2 x           Cytarabine 3g/m2 Q12H   X  x X  x       Cytarabine IT         x        Recent Acute Sinusitis with Reoccurrence - Cough: Reports on-going dry cough. Chest xray on 5/13 without e/o infection or other etiology. Takes Zyrtec for allergies. 5/15 Reports on-going nasal drainage, clear, and sinus pressure. She recently completed a course of Augmentin on 5/8 (7 day course). Given concern for possible recurrent infection prescribed Levaquin 750 mg x 7 days (5/15-5/21) then will start Levaquin ppx.    Grade 1 Peripheral Neuropathy: Paraesthesia present primarily in her fingers, currently without medical interventions, continue to monitor while on therapy.    Hx Anxiety: Reports taking Lexapro 10 mg at home and no longer using Ativan PRN.     Acute on Chronic Back/Neck Pain: Followed by [facility] Palliative Care. She was started on Lidocaine patches and Voltaren gel while admitted. Reported she is no longer using  Oxycodone PRN. She has follow up with PC on 6/1 (Dr. Genice Rouge).      Hx Headaches: Use Esgic PRN, all LP's have been negative for disease.      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    Impending Pancytopenia Secondary to Chemotherapy: Interim lab checks as above.

## 2021-12-13 NOTE — Unmapped (Addendum)
Pt's A&O x4, VSS, afebrile, c/o dry cough but refused PRN meds and claritin, nystagmus was found prior chemo per chemo nurse and got chemo clarification order from MD and cytarabine given, continuous sodium bicarb@150  mL/hr running, schedule leucovorin given, adequate UOP, WCTM.         Problem: Adult Inpatient Plan of Care  Goal: Plan of Care Review  Outcome: Ongoing - Unchanged  Goal: Patient-Specific Goal (Individualized)  Outcome: Ongoing - Unchanged  Goal: Absence of Hospital-Acquired Illness or Injury  Outcome: Ongoing - Unchanged  Intervention: Identify and Manage Fall Risk  Recent Flowsheet Documentation  Taken 12/12/2021 2100 by Sharlee Blew, RN  Safety Interventions:   lighting adjusted for tasks/safety   fall reduction program maintained   commode/urinal/bedpan at bedside   chemotherapeutic agent precautions   nonskid shoes/slippers when out of bed  Intervention: Prevent Skin Injury  Recent Flowsheet Documentation  Taken 12/12/2021 2100 by Sharlee Blew, RN  Skin Protection: adhesive use limited  Intervention: Prevent and Manage VTE (Venous Thromboembolism) Risk  Recent Flowsheet Documentation  Taken 12/12/2021 2100 by Sharlee Blew, RN  Activity Management: activity adjusted per tolerance  Intervention: Prevent Infection  Recent Flowsheet Documentation  Taken 12/12/2021 2100 by Sharlee Blew, RN  Infection Prevention: cohorting utilized  Goal: Optimal Comfort and Wellbeing  Outcome: Ongoing - Unchanged  Goal: Readiness for Transition of Care  Outcome: Ongoing - Unchanged  Goal: Rounds/Family Conference  Outcome: Ongoing - Unchanged     Vitals:    12/13/21 0400   BP: 128/85   Pulse: 84   Resp: 16   Temp: 36.7 ??C (98.1 ??F)   SpO2: 97%

## 2021-12-13 NOTE — Unmapped (Cosign Needed)
Hematology/Oncology Daily Progress Note    Active Problems:    * No active hospital problems. *       LOS: 3 days     Assessment/Plan: Patricia [name] is a 46 y.o. 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    Plan Summary: C2D4=5/15 R-HyperCVAD. Dasatanib 70 mg daily. Valtrex ppx. Plan for IT chemotherapy in [facility] 5/16, orders placed. Recurrent Sinusitis, Levaquin 750 mg daily x 7 day course started, supportive care as below. Other issues, management per below.      Ph (+) ALL, CR1, likely small residual MRD (+): Initially diagnosed in 09/2021 and underwent GRAAPH-2005 induction complicated by PICC line associated superficial thrombus. BMBx on 5/2 revealed morphologic CR with likely small residual MRD (+) disease and BCR-ABL p190 level of 2 (27,301 prior). She has completed 3 IT treatments to date, all negative for disease. She was admitted for Cycle 2 R-HyperCVAD (1B).  - C2D1=12/10/21 R-HyperCVAD (1B)  - Dasatanib 70 mg daily  - Valtrex ppx  [ ]  Plan for IT in [facility] 5/16, orders placed      Regimen: R-HyperCVAD  Cycle # 2  Primary Oncologist: Senaida Ores     Even Cycles  Date  5/12           Day 1 2 3 4 5    Methotrexate IV 1000mg /m2 x           Cytarabine 3g/m2 Q12H   X  x X  x       Rituximab 375 mg/m2    x    Cytarabine IT           x      EVEN CYCLE DETAILS  - If age <3 yo will need 24 hours MTX level   - bicarb continuous infusion until MTX level <0.05  - Leucovorin titration until MTX level <0.05   - daily UA to maintain pH >/= 7.0         Disposition:  - 5/19 GCS-F/IT Chemotherapy and Labs 2x/weekly beginning  - 6/1 Follow Up with Palliative Care (Dr. Genice Rouge)  - 6/1 Follow Up with [name], NP    Recent Acute Sinusitis - Cough: Reports on-going dry cough. Chest xray on 5/13 without e/o infection or other etiology. Takes Zyrtec for allergies. 5/15 Reports on-going nasal drainage, clear, and sinus pressure. She recently completed a course of Augmentin on 5/8 (7 day course). Given concern for possible recurrent infection will begin course of Levaquin.  - Levaquin 750 mg daily x 7 days (5/15-5/21) then transition to Levaquin ppx  - Robitussin-DM PRN  - Menthol drops PRN  - Chloraseptic spray PRN     Grade 1 Peripheral Neuropathy: Paraesthesia present primarily in her fingers, currently without medical interventions, continue to monitor while on therapy.   - CTM     Hx Anxiety: Reports taking Lexapro 10 mg at home and, no longer using Ativan PRN.       Acute on Chronic Back/Neck Pain: Followed by [facility]. She was started on Lidocaine patches and Voltaren gel while admitted. Reported she is no longer using Oxycodone PRN. She has follow up with PC on 6/1 (Dr. Genice Rouge).   - Lidocaine patch 5%  - Voltaren gel PRN  - Has follow up with palliative care (Dr. Genice Rouge) on 6/1     Hx Headaches: Uses Esgic PRN.  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 Chronic pain stable. Updated on plan of care.      10 point ROS otherwise negative except as above in the HPI.     Objective:     Vital signs in last 24 hours:  Temp:  [36.7 ??C (98.1 ??F)-37 ??C (98.6 ??F)] 36.9 ??C (98.4 ??F)  Heart Rate:  [84-101] 101  Resp:  [16] 16  BP: (124-132)/(83-88) 129/83  MAP (mmHg):  [96-101] 96  SpO2:  [94 %-98 %] 94 %    Intake/Output last 3 shifts:  I/O last 3 completed shifts:  In: 3902 [P.O.:1000; I.V.:2902]  Out: -     Meds:  Current Facility-Administered Medications   Medication Dose Route Frequency Provider Last Rate Last Admin    albuterol (PROVENTIL HFA;VENTOLIN HFA) 90 mcg/actuation inhaler 2 puff  2 puff Inhalation Q6H PRN Lu Duffel, PA   2 puff at 12/12/21 2100    cetirizine (ZyrTEC) tablet 10 mg  10 mg Oral Nightly Maryanna Shape, [place]   10 mg at 12/12/21 2149    CHEMO CLARIFICATION ORDER   Other Continuous PRN Sol Passer Tommi Rumps, NP        CHEMO CLARIFICATION ORDER   Other Continuous PRN Sol Passer Tommi Rumps, NP        cytarabine (PF) (ARA-C) 6,030 mg in sodium chloride (NS) 0.9 % 250 mL IVPB  3 g/m2 (Treatment Plan Recorded) Intravenous Q12H [name], MD   Stopped at 12/13/21 0554    dasatinib (SPRYCEL) tablet 70 mg  70 mg Oral Daily [name], MD   70 mg at 12/12/21 1636    dextromethorphan-guaifenesin (ROBITUSSIN-DM) 2-20 mg/mL oral syrup  10 mL Oral Q4H PRN Guy Sandifer, NP        diclofenac sodium (VOLTAREN) 1 % gel 2 g  2 g Topical QID PRN Lu Duffel, PA   2 g at 12/11/21 0839    diphenhydrAMINE (BENADRYL) injection 25 mg  25 mg Intravenous Q4H PRN [name], MD        emollient combination no.92 (LUBRIDERM) lotion 1 application.  1 application. Topical Q1H PRN Maryanna Shape, PA        EPINEPHrine [facility]) injection 0.3 mg  0.3 mg Intramuscular Daily PRN [name], MD        famotidine (PF) (PEPCID) injection 20 mg  20 mg Intravenous Q4H PRN [name], MD        IP OKAY TO TREAT   Other Continuous PRN Maryanna Shape, PA        leuCOVorin [facility]) tablet 25 mg  25 mg Oral Q6H [name], MD   25 mg at 12/13/21 0919    levoFLOXacin (LEVAQUIN) tablet 750 mg  750 mg Oral Daily Oumar Marcott Sarita Tommi Rumps, NP        lidocaine (LIDODERM) 5 % patch 1 patch  1 patch Transdermal Daily Lu Duffel, [place]   1 patch at 12/12/21 1203    loperamide (IMODIUM) capsule 2 mg  2 mg Oral Q2H PRN Maryanna Shape, PA  loperamide (IMODIUM) capsule 4 mg  4 mg Oral Once PRN Maryanna Shape, PA        loratadine (CLARITIN) tablet 10 mg  10 mg Oral Once Dillard Essex, FNP        menthol (COUGH DROPS) lozenge 1 lozenge  1 lozenge Oral Q2H PRN Lu Duffel, PA   1 lozenge at 12/10/21 2056    meperidine (DEMEROL) injection 25 mg  25 mg Intravenous Q30 Min PRN [name], MD        methylPREDNISolone sodium succinate (PF) (SOLU-medrol) injection 125 mg  125 mg Intravenous Q4H PRN [name], MD        methylPREDNISolone sodium succinate (PF) (SOLU-medrol) injection 50 mg  50 mg Intravenous Q12H [name], MD   50 mg at 12/13/21 0306    ondansetron (ZOFRAN) tablet 24 mg  24 mg Oral Q24H [name], MD   24 mg at 12/12/21 1520    phenoL (CHLORASEPTIC) 1.4 % spray 2 spray  2 spray Mucous Membrane Q2H PRN [name], MD   2 spray at 12/12/21 2100    polyethylene glycol (MIRALAX) packet 17 g  17 g Oral Daily PRN Guy Sandifer, NP        prednisoLONE acetate (PRED FORTE) 1 % ophthalmic suspension 2 drop  2 drop Both Eyes QID [name], MD   2 drop at 12/13/21 0523    prochlorperazine (COMPAZINE) injection 10 mg  10 mg Intravenous Q6H PRN [name], MD        prochlorperazine (COMPAZINE) tablet 10 mg  10 mg Oral Q6H PRN [name], MD        sodium bicarbonate 150 mEq in dextrose 5 % 1,150 mL continuous infusion  150 mEq Intravenous Continuous [name], MD 150 mL/hr at 12/13/21 0940 150 mEq at 12/13/21 0940    sodium chloride (NS) 0.9 % flush 10 mL  10 mL Intravenous BID Maryanna Shape, PA   10 mL at 12/13/21 5784    sodium chloride (NS) 0.9 % flush 10 mL  10 mL Intravenous BID Maryanna Shape, PA   10 mL at 12/13/21 0908    sodium chloride (NS) 0.9 % flush 10 mL  10 mL Intravenous BID Maryanna Shape, PA   10 mL at 12/13/21 0908    sodium chloride (NS) 0.9 % infusion  20 mL/hr Intravenous Continuous Maryanna Shape, PA        sodium chloride (NS) 0.9 % infusion  20 mL/hr Intravenous Continuous PRN [name], MD        sodium chloride 0.9% (NS) bolus 1,000 mL  1,000 mL Intravenous Daily PRN [name], MD        valACYclovir (VALTREX) tablet 500 mg  500 mg Oral Daily Maryanna Shape, PA   500 mg at 12/13/21 0901     Physical Exam:  General: Alert & Oriented, in no apparent distress, lying in bed  HEENT:  PERRL. No scleral icterus or conjunctival injection. MMM without ulceration, erythema or exudate. No cervical or axillary lymphadenopathy.   Heart:  RRR. S1, S2. No murmurs, gallops, or rubs.  Lungs:  Breathing is unlabored, and patient is speaking full sentences with ease.  No stridor.  CTAB. No rales, ronchi, or crackles.    Abdomen:  No distention or pain on palpation.  Bowel sounds are present and normoactive x 4.  No palpable hepatomegaly or splenomegaly.  No palpable masses.  Skin:  No rashes, petechiae or purpura.  No areas of skin breakdown. Warm to touch, dry, smooth, and even.  Musculoskeletal:  No grossly-evident joint effusions or deformities.  Range of motion about the shoulder, elbow, hips and knees is grossly normal.  Psychiatric:  Range of affect is appropriate.    Neurologic:  Alert and oriented to person, place, time and situation.  Gait is normal.  No Nystagmus Cerebellar tasks (finger-to-nose, rapid hand movement, heel along shin) are completed with ease and are symmetric.   Extremities:  Appear well-perfused. No clubbing, edema, or cyanosis.  CVAD: R CW Port - no erythema, nontender; dressing CDI.    Labs:  Recent Labs     12/11/21  0446 12/12/21  0058 12/13/21  0523   WBC 4.7 5.6 6.3   NEUTROABS 2.1 4.3 5.7   LYMPHSABS 1.8 1.1 0.4*   HGB 8.3* 8.3* 8.0*   HCT 24.1* 23.7* 23.1*   PLT 213 216 188   CREATININE 0.68 0.59* 0.59*   BUN 10 8* 10   BILITOT 0.7 0.9  --    BILIDIR 0.20 0.20  --    AST 23 23  --    ALT 20 22  --    ALKPHOS 56 57  --    K 3.6 3.7 3.8   CALCIUM 8.4* 9.2 8.4*   NA 140 142 143   CL 107 111* 109*   CO2 24.0 22.0 26.0     Imaging: No new imaging.     Binnie Kand, AGACNP-BC, AOCNP 12/13/2021  3:57 PM Nurse Practitioner  Hematology/Oncology Department   [facility]   Group Pager: [phone]

## 2021-12-13 NOTE — Unmapped (Signed)
High-Dose Methotrexate Therapeutic Monitoring Pharmacy Note    Patricia [name] is a 46 y.o. 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    Dosing Information:  Methotrexate Dose: 1 g/m2   Dose reduced? No  Date/Time of Initiation: 5/13 at 0959    Goals:  Methotrexate Levels  Monitor methotrexate levels with leucovorin dose adjusted per HyperCAVD protocol    Additional Clinical Monitoring/Outcomes  Maintain urine pH >/= 7 with serum CO2 </= 40  Monitor renal function (SCr and urine output)  Monitor for signs/symptoms of adverse events (e.g., myelosuppression, diarrhea, mucositis, neurotoxicity)    Results:   Methotrexate Levels    Level (micromolar) Hours   Date Time   17.75 25 12/12/21 1100   0.25 43 12/13/21 0523     Lab Results   Component Value Date    CREATININE 0.59 (L) 12/13/2021     Serum CO2: 26 mmol/L  Urine pH: 8.0    Pharmacokinetic Considerations and Significant Drug Interactions:  Concurrent nephrotoxic medications: Not applicable  Medications that delay methotrexate clearance: None identified    Assessment/Plan:  Urinary Alkalinization  Current urine pH is at goal (>/= 7); recommend to continue current urinary alkalinization therapy    Methotrexate Level Monitoring and Leucovorin Dosing  Methotrexate level within range according to HyperCVAD protocol  Recommend to continue current leucovorin dose of 50 mg x 1, then 25 mg PO Q6H to begin 12 hours after completion of methotrexate infusion    Cytarabine Dose Adjustment  Recommend to continue cytarabine at a dose of 3 g/m2    Follow-up  Continue to obtain a methotrexate level with morning labs until methotrexate level is < 0.05 micromolar  Please avoid using proton pump inhibitors, phenytoin, folic acid, trimethoprim/sulfamethoxazole, NSAIDs, aminoglycosides, and penicillins. Use ACE inhibitors, ARBs, fluoroquinolones and other beta lactam antibiotics with caution.   A pharmacist will continue to monitor and recommend supportive care as appropriate    Please page service pharmacist with questions/clarifications.    Ardyth Harps. Azucena Kuba, PharmD, BCOP  Hematology/Oncology Clinical Pharmacy Specialist

## 2021-12-14 LAB — URINALYSIS WITH MICROSCOPY
BACTERIA: NONE SEEN /HPF
BACTERIA: NONE SEEN /HPF
BILIRUBIN UA: NEGATIVE
BILIRUBIN UA: NEGATIVE
BLOOD UA: NEGATIVE
BLOOD UA: NEGATIVE
GLUCOSE UA: NEGATIVE
GLUCOSE UA: NEGATIVE
KETONES UA: NEGATIVE
KETONES UA: NEGATIVE
LEUKOCYTE ESTERASE UA: NEGATIVE
LEUKOCYTE ESTERASE UA: NEGATIVE
NITRITE UA: NEGATIVE
NITRITE UA: NEGATIVE
PH UA: 8 (ref 5.0–9.0)
PH UA: 7 (ref 5.0–9.0)
PROTEIN UA: NEGATIVE
PROTEIN UA: NEGATIVE
RBC UA: <1 /HPF (ref <=4)
RBC UA: <1 /HPF (ref <=4)
SPECIFIC GRAVITY UA: 1.01 (ref 1.003–1.030)
SPECIFIC GRAVITY UA: 1.005 (ref 1.003–1.030)
SQUAMOUS EPITHELIAL: 1 /HPF (ref 0–5)
SQUAMOUS EPITHELIAL: <1 /HPF (ref 0–5)
UROBILINOGEN UA: <2
UROBILINOGEN UA: <2
WBC UA: <1 /HPF (ref 0–5)
WBC UA: <1 /HPF (ref 0–5)

## 2021-12-14 LAB — BASIC METABOLIC PANEL
ANION GAP: 5 mmol/L (ref 5–14)
BLOOD UREA NITROGEN: 12 mg/dL (ref 9–23)
BUN / CREAT RATIO: 24
CALCIUM: 8.1 mg/dL — ABNORMAL LOW (ref 8.7–10.4)
CHLORIDE: 109 mmol/L — ABNORMAL HIGH (ref 98–107)
CO2: 29 mmol/L (ref 20.0–31.0)
CREATININE: 0.51 mg/dL — ABNORMAL LOW
EGFR CKD-EPI (2021) FEMALE: >90 mL/min/{1.73_m2} (ref >=60)
GLUCOSE RANDOM: 150 mg/dL (ref 70–179)
POTASSIUM: 3.4 mmol/L (ref 3.4–4.8)
SODIUM: 143 mmol/L (ref 135–145)

## 2021-12-14 LAB — HEPATIC FUNCTION PANEL
ALBUMIN: 3.2 g/dL — ABNORMAL LOW (ref 3.4–5.0)
ALKALINE PHOSPHATASE: 51 U/L (ref 46–116)
ALT (SGPT): 59 U/L — ABNORMAL HIGH (ref 10–49)
AST (SGOT): 37 U/L — ABNORMAL HIGH (ref <=34)
BILIRUBIN DIRECT: 0.2 mg/dL (ref 0.00–0.30)
BILIRUBIN TOTAL: 0.8 mg/dL (ref 0.3–1.2)
PROTEIN TOTAL: 5.8 g/dL (ref 5.7–8.2)

## 2021-12-14 LAB — METHOTREXATE LEVEL: METHOTREXATE LEVEL: <0.04 umol/L

## 2021-12-14 LAB — HEMATOPATHOLOGY LEUKEMIA/LYMPHOMA FLOW CYTOMETRY, CSF
LYMPHS CSF: 73.1 %
MONO/MACROPHAGE CSF: 26.9 %
NUCLEATED CELLS, CSF: 1 ul (ref 0–5)
NUMBER OF CELLS CSF: 52
RBC CSF: 2 ul — ABNORMAL HIGH

## 2021-12-14 LAB — CBC W/ AUTO DIFF
BASOPHILS ABSOLUTE COUNT: 0 10*9/L (ref 0.0–0.1)
BASOPHILS RELATIVE PERCENT: 0 %
EOSINOPHILS ABSOLUTE COUNT: 0 10*9/L (ref 0.0–0.5)
EOSINOPHILS RELATIVE PERCENT: 0 %
HEMATOCRIT: 20.6 % — ABNORMAL LOW (ref 34.0–44.0)
HEMOGLOBIN: 7.1 g/dL — ABNORMAL LOW (ref 11.3–14.9)
LYMPHOCYTES ABSOLUTE COUNT: 0.1 10*9/L — ABNORMAL LOW (ref 1.1–3.6)
LYMPHOCYTES RELATIVE PERCENT: 0.9 %
MEAN CORPUSCULAR HEMOGLOBIN CONC: 34.6 g/dL (ref 32.0–36.0)
MEAN CORPUSCULAR HEMOGLOBIN: 33.1 pg — ABNORMAL HIGH (ref 25.9–32.4)
MEAN CORPUSCULAR VOLUME: 95.6 fL (ref 77.6–95.7)
MEAN PLATELET VOLUME: 6.9 fL (ref 6.8–10.7)
MONOCYTES ABSOLUTE COUNT: 0 10*9/L — ABNORMAL LOW (ref 0.3–0.8)
MONOCYTES RELATIVE PERCENT: 0.2 %
NEUTROPHILS ABSOLUTE COUNT: 5.7 10*9/L (ref 1.8–7.8)
NEUTROPHILS RELATIVE PERCENT: 98.9 %
PLATELET COUNT: 143 10*9/L — ABNORMAL LOW (ref 150–450)
RED BLOOD CELL COUNT: 2.16 10*12/L — ABNORMAL LOW (ref 3.95–5.13)
RED CELL DISTRIBUTION WIDTH: 20.8 % — ABNORMAL HIGH (ref 12.2–15.2)
WBC ADJUSTED: 5.8 10*9/L (ref 3.6–11.2)

## 2021-12-14 MED ORDER — DAPSONE 100 MG TABLET
ORAL_TABLET | Freq: Every day | ORAL | 2 refills | 30 days | Status: CP
Start: 2021-12-14 — End: 2022-03-14
  Filled 2021-12-14: qty 30, 30d supply, fill #0

## 2021-12-14 MED ORDER — DASATINIB 70 MG TABLET
ORAL_TABLET | Freq: Every day | ORAL | 6 refills | 60 days | Status: CP
Start: 2021-12-14 — End: ?

## 2021-12-14 MED ORDER — DICLOFENAC 1 % TOPICAL GEL
Freq: Four times a day (QID) | TOPICAL | 0 refills | 13 days | Status: CP | PRN
Start: 2021-12-14 — End: 2022-12-14
  Filled 2021-12-14: qty 100, 13d supply, fill #0

## 2021-12-14 MED ORDER — FLUCONAZOLE 200 MG TABLET
ORAL_TABLET | Freq: Every day | ORAL | 0 refills | 30 days | Status: CP
Start: 2021-12-14 — End: 2022-01-13
  Filled 2021-12-14: qty 30, 30d supply, fill #0

## 2021-12-14 MED ORDER — PREDNISOLONE ACETATE 1 % EYE DROPS,SUSPENSION
Freq: Four times a day (QID) | OPHTHALMIC | 0 refills | 13 days | Status: CP
Start: 2021-12-14 — End: 2021-12-21
  Filled 2021-12-14: qty 5, 7d supply, fill #0

## 2021-12-14 MED ORDER — LIDOCAINE 4 % TOPICAL PATCH
MEDICATED_PATCH | Freq: Every day | TRANSDERMAL | 0 refills | 30 days | Status: CP
Start: 2021-12-14 — End: 2022-01-13
  Filled 2021-12-14: qty 10, 10d supply, fill #0

## 2021-12-14 MED ADMIN — prednisoLONE acetate (PRED FORTE) 1 % ophthalmic suspension 2 drop: 2 [drp] | OPHTHALMIC | @ 01:00:00

## 2021-12-14 MED ADMIN — leuCOVorin (WELLCOVORIN) tablet 25 mg: 25 mg | ORAL | @ 08:00:00

## 2021-12-14 MED ADMIN — diphenhydrAMINE (BENADRYL) injection 25 mg: 25 mg | INTRAVENOUS | @ 01:00:00 | Stop: 2021-12-13

## 2021-12-14 MED ADMIN — sodium chloride (NS) 0.9 % flush 10 mL: 10 mL | INTRAVENOUS | @ 14:00:00

## 2021-12-14 MED ADMIN — acetaminophen (TYLENOL) tablet 650 mg: 650 mg | ORAL | @ 01:00:00 | Stop: 2021-12-13

## 2021-12-14 MED ADMIN — cetirizine (ZyrTEC) tablet 10 mg: 10 mg | ORAL | @ 01:00:00

## 2021-12-14 MED ADMIN — dasatinib (SPRYCEL) tablet 70 mg: 70 mg | ORAL | @ 21:00:00

## 2021-12-14 MED ADMIN — riTUXimab-abbs (TRUXIMA) 754 mg in sodium chloride (NS) 0.9 % 615.4 mL IVPB: 375 mg/m2 | INTRAVENOUS | @ 02:00:00 | Stop: 2021-12-13

## 2021-12-14 MED ADMIN — sodium bicarbonate 150 mEq in dextrose 5 % 1,150 mL continuous infusion: 150 meq | INTRAVENOUS | @ 05:00:00

## 2021-12-14 MED ADMIN — phenoL (CHLORASEPTIC) 1.4 % spray 2 spray: 2 | @ 04:00:00

## 2021-12-14 MED ADMIN — prednisoLONE acetate (PRED FORTE) 1 % ophthalmic suspension 2 drop: 2 [drp] | OPHTHALMIC | @ 21:00:00

## 2021-12-14 MED ADMIN — prednisoLONE acetate (PRED FORTE) 1 % ophthalmic suspension 2 drop: 2 [drp] | OPHTHALMIC | @ 10:00:00

## 2021-12-14 MED ADMIN — leuCOVorin (WELLCOVORIN) tablet 25 mg: 25 mg | ORAL | @ 02:00:00

## 2021-12-14 MED ADMIN — prednisoLONE acetate (PRED FORTE) 1 % ophthalmic suspension 2 drop: 2 [drp] | OPHTHALMIC | @ 17:00:00

## 2021-12-14 MED ADMIN — leuCOVorin (WELLCOVORIN) tablet 25 mg: 25 mg | ORAL | @ 14:00:00

## 2021-12-14 MED ADMIN — sodium chloride (NS) 0.9 % flush 10 mL: 10 mL | INTRAVENOUS | @ 01:00:00

## 2021-12-14 MED ADMIN — valACYclovir (VALTREX) tablet 500 mg: 500 mg | ORAL | @ 14:00:00

## 2021-12-14 MED ADMIN — albuterol (PROVENTIL HFA;VENTOLIN HFA) 90 mcg/actuation inhaler 2 puff: 2 | RESPIRATORY_TRACT | @ 01:00:00

## 2021-12-14 MED ADMIN — cytarabine (PF) (ARA-C) 100 mg, hydrocortisone sod succ (Solu-CORTEF) 50 mg in sodium chloride (NS) 0.9 % 4 mL INTRATHECAL syringe: INTRATHECAL | @ 16:00:00 | Stop: 2021-12-14

## 2021-12-14 MED ADMIN — methylPREDNISolone sodium succinate (PF) (SOLU-medrol) injection 50 mg: 50 mg | INTRAVENOUS | @ 07:00:00 | Stop: 2021-12-14

## 2021-12-14 MED ADMIN — LORazepam (ATIVAN) 2 mg/mL injection: INTRAVENOUS | @ 15:00:00 | Stop: 2021-12-14

## 2021-12-14 MED ADMIN — diphenhydrAMINE (BENADRYL) capsule/tablet 25 mg: 25 mg | ORAL | @ 01:00:00 | Stop: 2021-12-13

## 2021-12-14 MED ADMIN — LORazepam (ATIVAN) injection 0.5 mg: .5 mg | INTRAVENOUS | @ 15:00:00 | Stop: 2021-12-14

## 2021-12-14 MED ADMIN — menthol (COUGH DROPS) lozenge 1 lozenge: 1 | ORAL | @ 04:00:00

## 2021-12-14 MED ADMIN — levoFLOXacin (LEVAQUIN) tablet 750 mg: 750 mg | ORAL | @ 14:00:00 | Stop: 2021-12-20

## 2021-12-14 MED ADMIN — furosemide (LASIX) injection 20 mg: 20 mg | INTRAVENOUS | @ 23:00:00 | Stop: 2021-12-14

## 2021-12-14 MED ADMIN — cytarabine (PF) (ARA-C) 6,030 mg in sodium chloride (NS) 0.9 % 250 mL IVPB: 3 g/m2 | INTRAVENOUS | @ 08:00:00 | Stop: 2021-12-14

## 2021-12-14 NOTE — Unmapped (Signed)
Patient rested intermittently throughout shift. Received rituximab and tolerated well with reaching maximum rate. Scheduled cyterabine given, cerebellar exam performed. Patient continues to have strong non-productive cough. Planning for IT chemo today. No falls or injuries overnight. No further concerns at this time.       Problem: Adult Inpatient Plan of Care  Goal: Plan of Care Review  Outcome: Ongoing - Unchanged  Goal: Patient-Specific Goal (Individualized)  Outcome: Ongoing - Unchanged  Goal: Absence of Hospital-Acquired Illness or Injury  Outcome: Ongoing - Unchanged  Intervention: Identify and Manage Fall Risk  Recent Flowsheet Documentation  Taken 12/13/2021 2211 by Lolita Patella, RN  Safety Interventions:   fall reduction program maintained   lighting adjusted for tasks/safety   low bed   nonskid shoes/slippers when out of bed  Intervention: Prevent Skin Injury  Recent Flowsheet Documentation  Taken 12/13/2021 2211 by Lolita Patella, RN  Skin Protection: adhesive use limited  Intervention: Prevent and Manage VTE (Venous Thromboembolism) Risk  Recent Flowsheet Documentation  Taken 12/13/2021 2211 by Lolita Patella, RN  Activity Management: activity adjusted per tolerance  Intervention: Prevent Infection  Recent Flowsheet Documentation  Taken 12/13/2021 2211 by Lolita Patella, RN  Infection Prevention: hand hygiene promoted  Goal: Optimal Comfort and Wellbeing  Outcome: Ongoing - Unchanged  Goal: Readiness for Transition of Care  Outcome: Ongoing - Unchanged  Goal: Rounds/Family Conference  Outcome: Ongoing - Unchanged

## 2021-12-14 NOTE — Unmapped (Signed)
Physician Discharge Summary [facility]  4 ONC [facility]  [address]  [place] [address]  Dept: [phone]  Loc: [phone]     Identifying Information:   ENCARNACION BOLE  25-Jan-1976  [id number]    Primary Care Physician: [name], MD     Referring Physician: [name]     Code Status: Full Code    Admit Date: 12/10/2021    Discharge Date: 12/14/2021     Discharge To: Home    Discharge Service: [facility] - Hematology APP Floor Team (MEDQ)     Discharge Attending Physician: Providence Crosby Dittus, MD    Discharge Diagnoses:  Active Problems:    * No active hospital problems. *  Resolved Problems:    * No resolved hospital problems. *      Outpatient Provider Follow Up Issues:   Supportive Care Recommendations:  We recommend based on the patient???s underlying diagnosis and treatment history the following supportive care:    1. Antimicrobial prophylaxis:  ALL (in remission, receiving post-remission therapy): Bacterial: Levofloxacin 500mg  PO daily (when absolute neutrophils </= 0.5);   Fungal: Fluconazole 400mg  PO daily (when absolute neutrophils </= 0.5);   Viral: Valacyclovir 500mg  PO daily (Continuous);   PJP:  For SMX/TMP intolerant patients:  Dapsone 100mg  PO daily; monthly nebulized pentamidine    2. Blood product support:  Leukoreduced blood products are required.  Irradiated blood products are preferred, but in case of urgent transfusion needs non-irradiated blood products may be used:     -  RBC transfusion threshold: transfuse 2 units for Hgb < 8 g/dL.    Based on the patient's disease status and intensity of therapy, complete blood count with differential should be evaluated 2 times per week and used to guide transfusion support    3. Hematopoietic growth factor support: pegylated filgrastim 24-72 hours after last chemotherapy dose    4. Patient needs OP LP with IT Chemotherapy: yes, Intrathecal chemotherapy signed: Yes    Hospital Course:   NATAYAH WARMACK is a 46 y.o. 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 She was admitted for C2 R-HyperCVAD (1B), D1=12/10/21. She tolerated therapy well. She cleared her MTX on D5. She underwent her 4th IT chemotherapy (Ara-C) on 5/16, hematopathology was pending on discharge. She will continue Dasatanib 70 mg daily (confirmed she had adequate supply at home). She will continue Prednisone eye drops x 7 days. She will discharge on Levaquin course for recurrent sinus infection through 5/21 then will start ppx dosing, Fluconazole, Valtrex and Dapsone ppx (given hx Sulfa allergy). She will return on 5/19 for GCS-F, IT chemotherapy and will begin labs 2x/weekly with support. She will return on 6/1 to see [name], NP and also has a visit with Palliative Care (Dr. Genice Rouge) for management of her chronic back/shoulder pain. Her detailed hospital course and disposition is outlined below.    Disposition:  - 5/19 GCS-F/IT Chemotherapy and Labs 2x/weekly beginning  - 6/1 Follow Up with Palliative Care (Dr. 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 BMBx on 5/2 revealed morphologic CR with likely small residual MRD (+) disease and BCR-ABL p190 level of 2 (27,301 prior). She has completed 3 IT treatments to date, all negative for disease. She was admitted for Cycle 2  R-HyperCVAD (1B).  She tolerated therapy well. She cleared her MTX on D5. She underwent her 4th IT chemotherapy (Ara-C) on 5/16, hematopathology was pending on discharge. She will continue Dasatanib 70 mg daily (confirmed she had adequate supply at home). She will continue Prednisone eye drops x 7 days. She will discharge on Levaquin course for recurrent sinus infection as noted below, Fluconazole, Valtrex and Dapsone ppx (given hx Sulfa allergy). She will return on 5/19 for GCS-F, IT chemotherapy and will begin labs 2x/weekly with support. She will return on 6/1 to see [name], NP and also has a visit with Palliative Care (Dr. Genice Rouge) for management of her chronic back/shoulder pain.     Regimen: R-HyperCVAD (1B)  Cycle # 2  Primary Oncologist: Dr. Senaida Ores     Even Cycles  Date  5/12           Day 1 2 3 4 5    Methotrexate IV 1000mg /m2 x           Cytarabine 3g/m2 Q12H   X  x X  x       Cytarabine IT         x        Recent Acute Sinusitis with Reoccurrence - Cough: Reports on-going dry cough. Chest xray on 5/13 without e/o infection or other etiology. Takes Zyrtec for allergies. 5/15 Reports on-going nasal drainage, clear, and sinus pressure. She recently completed a course of Augmentin on 5/8 (7 day course). Given concern for possible recurrent infection prescribed Levaquin 750 mg x 7 days (5/15-5/21) then will start Levaquin ppx.    Grade 1 Peripheral Neuropathy: Paraesthesia present primarily in her fingers, currently without medical interventions, continue to monitor while on therapy.    Hx Anxiety: Reports taking Lexapro 10 mg at home and no longer using Ativan PRN.     Acute on Chronic Back/Neck Pain: Followed by [facility] Palliative Care. She was started on Lidocaine patches and Voltaren gel while admitted. Reported she is no longer using Oxycodone PRN. She has follow up with PC on 6/1 (Dr. Genice Rouge).      Hx Headaches: Use Esgic PRN, all LP's have been negative for disease.      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    Impending Pancytopenia Secondary to Chemotherapy: Interim lab checks as above.      Procedures:  lumbar puncture  ONCBCN INFUSION APPOINTMENT REQUEST [[id number]]  ______________________________________________________________________  Discharge Medications:     Your Medication List        STOP taking these medications      butalbital-acetaminophen-caffeine 50-325-40 mg per tablet  Commonly known as: ESGIC     dexAMETHasone 4 MG tablet  Commonly known as: DECADRON     LORazepam 0.5 MG tablet  Commonly known as: ATIVAN     oxyCODONE 5 MG immediate release tablet  Commonly known as: ROXICODONE     polyethylene glycol 17 gram/dose powder  Commonly known as: GLYCOLAX            START taking these medications      dapsone 100 MG tablet  Take 1 tablet (100 mg total) by mouth daily.     diclofenac sodium 1 % gel  Commonly known as: VOLTAREN  Apply 2 g topically four (4) times a day as needed.     fluconazole 100 MG tablet  Commonly known as: DIFLUCAN  Take 2 tablets (200 mg total) by mouth daily.     levoFLOXacin 750 MG tablet  Commonly known as: LEVAQUIN  Take 1 tablet (750 mg total) by mouth daily for 5 days. Take last dose on 5/21 then begin Levaquin 500 mg daily.  Start taking on: Dec 15, 2021     levoFLOXacin 500 MG tablet  Commonly known as: LEVAQUIN  Take 1 tablet (500 mg total) by mouth daily. Begin on 5/22 after you complete your Levaquin 750 mg daily course.  Start taking on: Dec 20, 2021     lidocaine 5 % patch  Commonly known as: LIDODERM  Place 1 patch on the skin daily. Apply to affected area for 12 hours only each day (then remove patch)     prednisoLONE acetate 1 % ophthalmic suspension  Commonly known as: PRED FORTE  Administer 2 drops to both eyes four (4) times a day for 7 days.            CHANGE how you take these medications      dasatinib 70 MG tablet  Commonly known as: SPRYCEL  Take 1 tablet (70 mg total) by mouth daily. DOES NOT NEED REFILL, UPDATED DOSAGE INSTRUCTIONS.  What changed:   how much to take  additional instructions            CONTINUE taking these medications      acetaminophen 650 MG CR tablet  Commonly known as: TYLENOL 8 HOUR  Take 1 tablet (650 mg total) by mouth every eight (8) hours as needed for pain.     albuterol 90 mcg/actuation inhaler  Commonly known as: PROVENTIL HFA;VENTOLIN HFA  Inhale 2 puffs every six (6) hours as needed for wheezing.     cetirizine 10 MG tablet  Commonly known as: ZyrTEC  Take 1 tablet (10 mg total) by mouth nightly.     prochlorperazine 10 MG tablet  Commonly known as: COMPAZINE  Take 1 tablet (10 mg total) by mouth every six (6) hours as needed for up to 14 days.     valACYclovir 500 MG tablet  Commonly known as: VALTREX  Take 1 tablet (500 mg total) by mouth daily.              Allergies:  Erythromycin, Sulfa (sulfonamide antibiotics), and Azithromycin  ______________________________________________________________________  Pending Test Results (if blank, then none):        Most Recent Labs:  All lab results last 24 hours -   Recent Results (from the past 24 hour(s))   Basic Metabolic Panel    Collection Time: 12/14/21  5:42 AM   Result Value Ref Range    Sodium 143 135 - 145 mmol/L    Potassium 3.4 3.4 - 4.8 mmol/L    Chloride 109 (H) 98 - 107 mmol/L    CO2 29.0 20.0 - 31.0 mmol/L    Anion Gap 5 5 - 14 mmol/L    BUN 12 9 - 23 mg/dL    Creatinine 7.84 (L) 0.60 - 0.80 mg/dL    BUN/Creatinine Ratio 24     eGFR CKD-EPI (2021) Female >90 >=60 mL/min/1.86m2    Glucose 150 70 - 179 mg/dL    Calcium 8.1 (L) 8.7 - 10.4 mg/dL   Methotrexate level    Collection Time: 12/14/21  5:42 AM   Result Value Ref Range    Methotrexate Level <0.04 Undefined umol/L   Hepatic Function Panel    Collection Time: 12/14/21  5:42 AM   Result Value Ref Range    Albumin 3.2 (L) 3.4 - 5.0 g/dL    Total Protein 5.8  5.7 - 8.2 g/dL    Total Bilirubin 0.8 0.3 - 1.2 mg/dL    Bilirubin, Direct 1.61 0.00 - 0.30 mg/dL    AST 37 (H) <=09 U/L    ALT 59 (H) 10 - 49 U/L    Alkaline Phosphatase 51 46 - 116 U/L   CBC w/ Differential    Collection Time: 12/14/21  5:42 AM   Result Value Ref Range    WBC 5.8 3.6 - 11.2 10*9/L    RBC 2.16 (L) 3.95 - 5.13 10*12/L    HGB 7.1 (L) 11.3 - 14.9 g/dL    HCT 60.4 (L) 54.0 - 44.0 %    MCV 95.6 77.6 - 95.7 fL    MCH 33.1 (H) 25.9 - 32.4 pg    MCHC 34.6 32.0 - 36.0 g/dL    RDW 98.1 (H) 19.1 - 15.2 %    MPV 6.9 6.8 - 10.7 fL    Platelet 143 (L) 150 - 450 10*9/L    Neutrophils % 98.9 %    Lymphocytes % 0.9 %    Monocytes % 0.2 %    Eosinophils % 0.0 %    Basophils % 0.0 %    Absolute Neutrophils 5.7 1.8 - 7.8 10*9/L    Absolute Lymphocytes 0.1 (L) 1.1 - 3.6 10*9/L    Absolute Monocytes 0.0 (L) 0.3 - 0.8 10*9/L    Absolute Eosinophils 0.0 0.0 - 0.5 10*9/L    Absolute Basophils 0.0 0.0 - 0.1 10*9/L    Anisocytosis Moderate (A) Not Present   Urinalysis with Microscopy    Collection Time: 12/14/21  5:53 AM   Result Value Ref Range    Color, UA Colorless     Clarity, UA Clear     Specific Gravity, UA 1.010 1.003 - 1.030    pH, UA 8.0 5.0 - 9.0    Leukocyte Esterase, UA Negative Negative    Nitrite, UA Negative Negative    Protein, UA Negative Negative    Glucose, UA Negative Negative    Ketones, UA Negative Negative    Urobilinogen, UA <2.0 mg/dL <4.7 mg/dL    Bilirubin, UA Negative Negative    Blood, UA Negative Negative    RBC, UA <1 <=4 /HPF    WBC, UA <1 0 - 5 /HPF    Squam Epithel, UA 1 0 - 5 /HPF    Bacteria, UA None Seen None Seen /HPF   Prepare RBC    Collection Time: 12/14/21  8:49 AM   Result Value Ref Range    Crossmatch Compatible     Unit Blood Type O Pos     ISBT Number 5100     Unit # [id number]     Status Ready     Spec Expiration [id number]     Product ID Red Blood Cells     PRODUCT CODE [id number]    Prepare RBC    Collection Time: 12/14/21 10:05 AM   Result Value Ref Range    Crossmatch Compatible     Unit Blood Type O Pos     ISBT Number 5100     Unit # [id number]     Status Ready     Spec Expiration [id number]     Product ID Red Blood Cells     PRODUCT CODE [id number]        Relevant Studies/Radiology (if blank, then none):  XR Chest Portable    Result Date: 12/11/2021  EXAM: XR CHEST PORTABLE DATE: 12/11/2021 9:43 AM ACCESSION: [id number] Bethann Humble  DICTATED: 12/11/2021 11:05 AM INTERPRETATION LOCATION: Main Campus CLINICAL INDICATION: 46 years old Female with COUGH  TECHNIQUE: Single View AP Chest Radiograph. COMPARISON: None FINDINGS: Right chest wall dual lumen port with tip terminating at the cavoatrial junction. Lungs are clear. No pleural effusion or pneumothorax. Unremarkable cardiomediastinal silhouette.     No acute abnormalities.   ______________________________________________________________________    Activity Instructions       Activity as tolerated      It is important to remember that you blood counts will continue to drop while you are at home. This will cause you to feel more tired and place you at an increased risk for bleeding. Please avoid extraneous activities.            Diet Instructions       Discharge diet (specify)      Discharge Nutrition Therapy: Regular    While your blood counts are low, it is important to follow these Neutropenic precautions:  Wash your hands frequently with soap and water  Take your temperature when you have chills or are not feeling well  Do not get cuts, punctures or scratches on your skin  Use a soft toothbrush  Prevent constipation  Drink lots of fluids (water, juice, etc.) to prevent dehydration  Avoid people who have colds or the flu  Do not visit crowded areas such as malls or movie theaters  Avoid anyone who has received a live vaccination (shot) within the last three weeks  Maintain a well-balanced diet and eat healthy foods, but avoid raw or uncooked foods, including raw vegetables and fruits  Speak with your doctor before having any dental work done   Limit exposure to pet feces (e.g., litter box)   Do only as much activity as you can tolerate                Other Instructions       Call MD for:  difficulty breathing, headache or visual disturbances      Call MD for:  extreme fatigue      Call MD for:  hives      Call MD for:  persistent dizziness or light-headedness      Call MD for:  persistent nausea or vomiting      Call MD for:  redness, tenderness, or signs of infection (pain, swelling, redness, odor or green/yellow discharge around incision site)      Call MD for:  severe uncontrolled pain      Call MD for:  vaginal bleeding saturating more than 1 pad per hour.      Call MD for: Temperature > 38.5 Celsius ( > 101.3 Fahrenheit)      Discharge instructions      Diagnosis: [place] Positive ALL    Course: You completed Cycle 2 R-HyperCVAD chemotherapy and tolerated it well.     New/Important Medications:  - Levaquin 750 mg daily, you received your 2nd dose prior to leaving the hospital today, please begin taking daily starting 5/17 until course is completed (5 days) then start Levaquin 500 mg daily prophylaxis to help prevent further bacterial infections  - Fluconazole- to help prevent fungal infections  - Dapsone- to help prevent PJP pneumonia (common in immunocompromised people)  - Valtrex- to help prevent shingles  - Prednisone eye drops- please continue these for the next 7 days, they help protect your eyes from chemotherapy  - Continue taking Dasatanib 70 mg daily- oral chemotherapy   - Lidocaine patches and Voltaren gel-  for any pain    Home Health Needs:    - None    Follow up Appointments:   - 5/19 GCS-F/IT Chemotherapy and Labs 2x/weekly beginning  - 6/1 Follow Up with Palliative Care (Dr. Genice Rouge)  - 6/1 Follow Up with [name], NP    When to Call Your [facility] Cancer Care Team:   Monday- Friday from 8:00 am - 5:00 pm   On Nights, Weekends and Holidays   Call [phone]     Call [phone]  Or Toll free [phone]    Ask the operator to page the   Ask to speak to the Triage Nurse  Oncology Fellow on Call     **Spanish-speaking patients should call [phone]     RED ZONE:  Take action now!  You need to be seen right away. Call 911 or go to your nearest hospital for help.   - Symptoms are at a severe level of discomfort    - Bleeding that will not stop  - Chest Pain    - Hard to breathe    - Fall or passing out    - New Seizure    - Thoughts of hurting yourself or others     YELLOW ZONE: Take action today  This is NOT an all-inclusive list. Pleae call with any new or worsening symptoms.   Call your doctor, nurse or other healthcare provider at [phone]  You can be seen by a provider the same day through our Same Day Acute Care for Patients with Cancer program.   - Symptoms are new or worsening; You are not within your goal range for:    - Pain          - Swelling (leg, arm, abdomen, face, neck)    - Shortness of breath       - Skin rash or skin changes    - Bleeding (nose, urine, stool, wound)    - Wound issues (redness, drainage, re-opened)    - Feeling sick to your stomach and throwing up    - Confusion    - Mouth sores/pain in your mouth or throat    - Vision changes   - Hard stool or very loose stools (increase in ostomy   - Fever >100.4 F, chills   Output)        - Worsening cough with mucus that is green, yellow or bloody   - No urine for 12 hours      - Pain or burning when going to the bathroom    - Feeding tube or other catheter/tube issue    - Home infusion pump issue - call [phone]   - Redness or pain at previous IV or port/catheter site    - Depressed or anxiety     GREEN ZONE: You are in control   Your symptoms are under control. Continue to take your medicine as ordered. Keep all visits to the doctor.   - No increase or worsening symptoms   - Able to take your medicine   - Able to drink and eat     For your safety and best care, please DO NOT use MyChart messages to report red or yellow symptoms.   MyChart messages are only checked during weekday normal business hours and you should receive a   Response within 2 business days.   Please use MyChart only for the follows:   - Non-urgent medication refills, scheduling requests or general questions.  Patient Education:     Your blood counts will continue to drop, so it is good to adhere to the following precautions:    - Wash your hands routinely with soap and water  - Take your temperature when you have chills or are not feeling well  - Use a soft toothbrush  - Avoid constipation or straining with bowel movements. This may mean you occasionally need to take over-the-counter stool softeners or laxatives.   - Avoid people who have colds or the flu, or are not feeling well.  - Wear a mask when visiting crowded places.  - Avoid anyone who has received a live vaccination (shot) within the last three weeks  - Maintain a well-balanced diet and eat healthy foods  - Speak with your doctor before having any dental work done  - Limit exposure to pet feces (e.g., litter box)  - Do only as much activity as you can tolerate    Other instructions:  - Don't use dental floss if your platelet count is below 50,000. Your doctor or nurse should tell you if this is the case.  - Use any mouthwashes given to you as directed.  - If you can't tolerate regular brushing, use an oral swab (bristle-less) toothbrush, or use salt and baking soda to clean your mouth. Mix 1 teaspoon of salt and 1 teaspoon of baking soda into an 8-ounce glass of warm water. Swish and spit.  - Watch your mouth and tongue for white patches. This is a sign of fungal infection, a common side effect of chemotherapy. Be sure to tell your doctor about these patches. Medication/mouthwashes can be prescribed to help you fight the fungal infection.      COVID-19 is a new challenge, but [place] and the [facility] is dedicated to providing you and your loved ones with the best possible cancer care and support in the safest way possible during this time. We made two videos about the ways we are working to keep you safe, such as offering the option to visit your care team over the phone or through a video, as well as support services offered for our patients and their caregivers. If you have any questions about your cancer care, please call your care team.     Video #1: Keeping [facility] Cancer Care patients safe during the COVID-19 crisis  [email or web address].[email or web address]     Video #2: Support for cancer patients and their caregivers during the COVID-19 pandemic  [email or web address].[email or web address]                Appointments which have been scheduled for you      Dec 17, 2021  7:00 AM  (Arrive by 6:30 AM)  NURSE LAB DRAW with ADULT ONC LAB  [facility] ADULT ONCOLOGY LAB DRAW STATION [facility] REGION) [address]  [place] [address]  [phone]        Dec 17, 2021  8:00 AM  (Arrive by 7:30 AM)  LEVEL 060 with ONCDEV EXAM ROOM 1  [place] ONCOLOGY INFUSION [facility] REGION) [address]  [place] [address]  [phone]        Dec 17, 2021  8:30 AM  (Arrive by 8:00 AM)  FL LUMBAR PUNCTURE WITH CHEMO with Onnie Boer RM 9  [facility]) [address]  [place] [address]  [phone]        Dec 17, 2021 11:00 AM  (  Arrive by 10:30 AM)  BLOOD TRANSFUSION - 2 UNITS with ONCDEV CHAIR 49  [place] ONCOLOGY INFUSION [place] [facility] REGION) [address]  [place] [address]  [phone]        Dec 21, 2021  9:15 AM  (Arrive by 8:45 AM)  BLOOD TRANSFUSION - 2 UNITS with ONCDEV CHAIR 55  [place] ONCOLOGY INFUSION [place] [facility] REGION) [address]  [place] [address]  [phone]        Dec 24, 2021  9:00 AM  (Arrive by 8:30 AM)  BLOOD TRANSFUSION - 2 UNITS with [employer] CHAIR 17  [place] ONCOLOGY INFUSION [place] [facility] REGION) [address]  [place] [address]  [phone]        Dec 28, 2021  9:00 AM  (Arrive by 8:30 AM)  BLOOD TRANSFUSION - 2 UNITS with [employer] CHAIR 24  [place] ONCOLOGY INFUSION [place] [facility] REGION) [address]  [place] [address]  [phone]        Dec 30, 2021  7:00 AM  (Arrive by 6:30 AM)  LAB ONLY [place] with ADULT ONC LAB  [facility] ADULT ONCOLOGY LAB DRAW STATION [place] [facility] REGION) [address]  [place] [address]  [phone]        Dec 30, 2021  8:00 AM  (Arrive by 7:30 AM)  RETURN ACTIVE [place] with Lenon Ahmadi, AGNP  [place] HEMATOLOGY ONCOLOGY 2ND FLR CANCER HOSP [facility] REGION) [address]  [place] [address]  [phone]        Dec 30, 2021  9:00 AM  (Arrive by 8:30 AM)  NEW PALLIATIVE CARE with Everlena Cooper, MD  [facility] HEMATOLOGY ONCOLOGY 2ND FLR CANCER HOSP [facility] REGION) [address]  [place] [address]  [phone]        Dec 30, 2021 10:00 AM  (Arrive by 9:30 AM)  BLOOD TRANSFUSION - 2 UNITS with ONCDEV CHAIR 56  [place] ONCOLOGY INFUSION [place] [facility] REGION) [address]  [place] [address]  [phone]        Jan 04, 2022  9:00 AM  (Arrive by 8:30 AM)  BLOOD TRANSFUSION - 2 UNITS with ONCINF CHAIR 28   ONCOLOGY INFUSION [place] [facility] REGION) [address]  [place] [address]  [phone]             ______________________________________________________________________  Discharge Day Services:  BP 128/89  - Pulse 92  - Temp 37 ??C (98.6 ??F) (Oral)  - Resp 16  - Ht 170.3 cm (5' 7.05)  - Wt 81.6 kg (179 lb 14.4 oz)  - SpO2 98%  - BMI 28.14 kg/m??   Pt seen on the day of discharge and determined appropriate for discharge.    Condition at Discharge: good    Length of Discharge: I spent greater than 30 mins in the discharge of this patient.    Hinda Glatter, PA-C   Hematology/Oncology Department   [employer]   Pager: [phone]

## 2021-12-15 MED ORDER — LEVOFLOXACIN 750 MG TABLET
ORAL_TABLET | Freq: Every day | ORAL | 0 refills | 5.00000 days | Status: CP
Start: 2021-12-15 — End: 2021-12-20
  Filled 2021-12-14: qty 5, 5d supply, fill #0

## 2021-12-15 NOTE — Unmapped (Signed)
Patient with high BP at beginning of shift following second blood transfusion, provider notified, blood product transfusion reaction workup ordered and completed. Chest Xray completed at bedside, provider reviewed and confirmed patient discharge. Discharge education provided by previous shift RN. Verified that patient and spouse had no further question or concerns. Double lumen port flushed and de-accessed. Patient to follow up with provider per discharge instructions. No further concerns at this time.         Problem: Adult Inpatient Plan of Care  Goal: Plan of Care Review  12/14/2021 2210 by Lolita Patella, RN  Outcome: Ongoing - Unchanged  12/14/2021 2210 by Lolita Patella, RN  Outcome: Ongoing - Unchanged  Goal: Patient-Specific Goal (Individualized)  12/14/2021 2210 by Lolita Patella, RN  Outcome: Ongoing - Unchanged  12/14/2021 2210 by Lolita Patella, RN  Outcome: Ongoing - Unchanged  Goal: Absence of Hospital-Acquired Illness or Injury  12/14/2021 2210 by Lolita Patella, RN  Outcome: Ongoing - Unchanged  12/14/2021 2210 by Lolita Patella, RN  Outcome: Ongoing - Unchanged  Goal: Optimal Comfort and Wellbeing  12/14/2021 2210 by Lolita Patella, RN  Outcome: Ongoing - Unchanged  12/14/2021 2210 by Lolita Patella, RN  Outcome: Ongoing - Unchanged  Goal: Readiness for Transition of Care  12/14/2021 2210 by Lolita Patella, RN  Outcome: Ongoing - Unchanged  12/14/2021 2210 by Lolita Patella, RN  Outcome: Ongoing - Unchanged  Goal: Rounds/Family Conference  12/14/2021 2210 by Lolita Patella, RN  Outcome: Ongoing - Unchanged  12/14/2021 2210 by Lolita Patella, RN  Outcome: Ongoing - Unchanged

## 2021-12-15 NOTE — Unmapped (Signed)
Patricia [name] received LP with IT chemo in fluoro and came back and was given 2 uPRBCs as ordered. Upon completion, patient BP was elevated to 170/110. PA notified and ordered stat lasix and pCXR. Transfusion medicine resident contacted and requested to do a transfusion rxn workup. Workup done and both units sent to blood bank. NP at Bedside to assess patient. Discharge/AVS packet reviewed including post-transfusion reaction symptoms with patient and patient verbalized understanding of meds and follow up appointments. Gave report to night RN and night RN monitoring patient until discharge.

## 2021-12-15 NOTE — Unmapped (Signed)
Assessed at bedside for hypertension s/p receiving 2 units PRBC.    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    This afternoon she received 2 units PRBC prior to planned discharge. At the end of her second unit her vitals were notable for BP 170/110 with multiple attempts. She is afebrile, no acute distress, no c/o chest tightness, airway edema, chills, hives/pruritis, or dyspnea. She does have a dry cough which has been present throughout admission, and it is no worse than prior. On exam she is pleasant, alert, her lungs are clear, heart sounds regular, states she is ready for discharge.    Her clinical picture does not suggest acute transfusion reaction such as TACO; however, given the elevated BP will dose with lasix, obtain CXR, and recheck her clinical picture and BP prior to discharge.    Discussed with MDQ attending Dr. Vertell Limber and he is in agreement.    Jasmine Pang. Aurther Loft, MSN, APRN, AGNP-C  Nurse Practitioner  Hematology/Oncology  [facility]  Group pager: [phone]

## 2021-12-15 NOTE — Unmapped (Signed)
TRANSFUSION REACTION INTERPRETATION    Interpretation:  Due to lack of clerical errors as well as a negative direct antiglobulin test and no evidence of hemolysis, there was no incompatibility of the RBC unit.     The isolated symptom of hypertension (170/110) during transfusion is most consistent with patient underlying medical condition.      Recommendations:  1. Continue to transfuse as clinically indicated.  2. Please contact the blood bank with any additional questions or concerns.    I reviewed the patient's chart and transfusion medicine laboratory evaluation.  I agree with the above findings.      Silas Sacramento, MD MS  [facility] Transfusion Medicine Service     Clinical History:  The patient is a 46 y.o. 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 A unit of leukoreduced and irradiated RBC was ordered for anemia (hemoglobin 7.1 g/dL).    Transfusion Event:  On 12/14/21 at 1308, the patient was transfused with 2 RBC units after no premedication. After most of both units were transfused (all of the first unit and 287 mL of the second unit) at 1815 hrs, the patient had an increase in blood pressure. Per her clinical team, She remained afebrile, with no acute distress, no c/o chest tightness, airway edema, chills, hives/pruritis, or dyspnea. She did have a dry cough which was present throughout admission, and was no worse than prior. On exam she was reported to be pleasant, alert, with clear sounding lungs, and with regular heart sounds. The transfusion was stopped at 1815 hrs. A post reaction chest x ray revealed no bilateral pulmonary edema. A dose of Lasix was provided and the patient was discharged home.    Vitals Pre-transfusion:  Vitals Post-transfusion:    Temperature: 36.7 C Temperature: 36.7 C   Pulse: 99 bpm Pulse: 70 bpm   Blood Pressure: 149/96 mmHg Blood Pressure: 170/110 mmHg   Respirations: 16 rpm Respirations: 18 rpm     The patient did not have fever, chills, hives, SOB, or back pain.    Work-Up:  Clerical check confirmed the patient's identity, which matched the labels of the unit without any discrepancies. The serum of the pre- and post- transfusion samples are clear yellow in color, indicating a lack of hemolysis. The direct antiglobulin test is negative. The patient's ABO/Rh type is re-checked, which is confirmed as O+ on front and back typing. There is no evidence of incompatibility between the donor and the recipient on either unit provided.      Post transfusion labs: NA

## 2021-12-16 MED ORDER — BENZONATATE 100 MG CAPSULE
ORAL_CAPSULE | Freq: Four times a day (QID) | ORAL | 1 refills | 8 days | Status: CP | PRN
Start: 2021-12-16 — End: 2022-12-16

## 2021-12-17 ENCOUNTER — Other Ambulatory Visit: Admit: 2021-12-17 | Discharge: 2021-12-17 | Payer: PRIVATE HEALTH INSURANCE

## 2021-12-17 ENCOUNTER — Ambulatory Visit: Admit: 2021-12-17 | Discharge: 2021-12-17 | Payer: PRIVATE HEALTH INSURANCE

## 2021-12-17 DIAGNOSIS — C9100 Acute lymphoblastic leukemia not having achieved remission: Principal | ICD-10-CM

## 2021-12-17 LAB — CBC W/ AUTO DIFF
BASOPHILS ABSOLUTE COUNT: 0 10*9/L (ref 0.0–0.1)
BASOPHILS RELATIVE PERCENT: 0.6 %
EOSINOPHILS ABSOLUTE COUNT: 0.1 10*9/L (ref 0.0–0.5)
EOSINOPHILS RELATIVE PERCENT: 4.1 %
HEMATOCRIT: 27.2 % — ABNORMAL LOW (ref 34.0–44.0)
HEMOGLOBIN: 9.5 g/dL — ABNORMAL LOW (ref 11.3–14.9)
LYMPHOCYTES ABSOLUTE COUNT: 0.3 10*9/L — ABNORMAL LOW (ref 1.1–3.6)
LYMPHOCYTES RELATIVE PERCENT: 11.7 %
MEAN CORPUSCULAR HEMOGLOBIN CONC: 34.8 g/dL (ref 32.0–36.0)
MEAN CORPUSCULAR HEMOGLOBIN: 30.8 pg (ref 25.9–32.4)
MEAN CORPUSCULAR VOLUME: 88.5 fL (ref 77.6–95.7)
MEAN PLATELET VOLUME: 6.4 fL — ABNORMAL LOW (ref 6.8–10.7)
MONOCYTES ABSOLUTE COUNT: 0 10*9/L — ABNORMAL LOW (ref 0.3–0.8)
MONOCYTES RELATIVE PERCENT: 0 %
NEUTROPHILS ABSOLUTE COUNT: 2 10*9/L (ref 1.8–7.8)
NEUTROPHILS RELATIVE PERCENT: 83.6 %
PLATELET COUNT: 140 10*9/L — ABNORMAL LOW (ref 150–450)
RED BLOOD CELL COUNT: 3.07 10*12/L — ABNORMAL LOW (ref 3.95–5.13)
RED CELL DISTRIBUTION WIDTH: 22.2 % — ABNORMAL HIGH (ref 12.2–15.2)
WBC ADJUSTED: 2.4 10*9/L — ABNORMAL LOW (ref 3.6–11.2)

## 2021-12-17 LAB — HEMATOPATHOLOGY LEUKEMIA/LYMPHOMA FLOW CYTOMETRY, CSF
LYMPHS CSF: 69.2 %
MONO/MACROPHAGE CSF: 23.1 %
NEUTROPHILS, CSF: 7.7 %
NUCLEATED CELLS, CSF: 0 ul (ref 0–5)
NUMBER OF CELLS CSF: 13
RBC CSF: 107 ul — ABNORMAL HIGH

## 2021-12-17 MED ADMIN — LORazepam (ATIVAN) 2 mg/mL injection: INTRAVENOUS | @ 15:00:00 | Stop: 2021-12-17

## 2021-12-17 MED ADMIN — methotrexate (PRESERVATIVE FREE) 12 mg in sodium chloride (NS) 0.9 % 5.52 mL INTRATHECAL syringe: INTRATHECAL | @ 16:00:00 | Stop: 2021-12-17

## 2021-12-17 MED ADMIN — pegfilgrastim-bmez (ZIEXTENZO) injection 6 mg: 6 mg | SUBCUTANEOUS | @ 13:00:00 | Stop: 2021-12-17

## 2021-12-17 MED ADMIN — LORazepam (ATIVAN) injection 1 mg: 1 mg | INTRAVENOUS | @ 15:00:00 | Stop: 2021-12-17

## 2021-12-17 NOTE — Unmapped (Signed)
Hospital Outpatient Visit on 12/17/2021   Component Date Value Ref Range Status    Protein, CSF 12/17/2021 25  20 - 59 mg/dL Final    Glucose, CSF 16/05/9603 68  48 - 79 mg/dL Final    Tube # CSF 54/04/8118 Tube 1   Final    Color, CSF 12/17/2021 Colorless   Final    Appearance, CSF 12/17/2021 Clear   Final    Nucleated Cells, CSF 12/17/2021 0  0 - 5 ul Final    RBC, CSF 12/17/2021 107 (H)  0 ul Final    #Cells counted for [facility] 12/17/2021 13   Final    This is an appended report.  These results have been appended to a previously final verified report.    Neutrophil %, CSF 12/17/2021 7.7  % Final    This is an appended report.  These results have been appended to a previously final verified report.    Lymphs %, CSF 12/17/2021 69.2  % Final    This is an appended report.  These results have been appended to a previously final verified report.    Mono/Macrophage %, CSF 12/17/2021 23.1  % Final    This is an appended report.  These results have been appended to a previously final verified report.   Lab on 12/17/2021   Component Date Value Ref Range Status    WBC 12/17/2021 2.4 (L)  3.6 - 11.2 10*9/L Final    RBC 12/17/2021 3.07 (L)  3.95 - 5.13 10*12/L Final    HGB 12/17/2021 9.5 (L)  11.3 - 14.9 g/dL Final    HCT 14/78/2956 27.2 (L)  34.0 - 44.0 % Final    MCV 12/17/2021 88.5  77.6 - 95.7 fL Final    MCH 12/17/2021 30.8  25.9 - 32.4 pg Final    MCHC 12/17/2021 34.8  32.0 - 36.0 g/dL Final    RDW 21/30/8657 22.2 (H)  12.2 - 15.2 % Final    MPV 12/17/2021 6.4 (L)  6.8 - 10.7 fL Final    Platelet 12/17/2021 140 (L)  150 - 450 10*9/L Final    Neutrophils % 12/17/2021 83.6  % Final    Lymphocytes % 12/17/2021 11.7  % Final    Monocytes % 12/17/2021 0.0  % Final    Eosinophils % 12/17/2021 4.1  % Final    Basophils % 12/17/2021 0.6  % Final    Absolute Neutrophils 12/17/2021 2.0  1.8 - 7.8 10*9/L Final    Absolute Lymphocytes 12/17/2021 0.3 (L)  1.1 - 3.6 10*9/L Final    Absolute Monocytes 12/17/2021 0.0 (L)  0.3 - 0.8 10*9/L Final    Absolute Eosinophils 12/17/2021 0.1  0.0 - 0.5 10*9/L Final    Absolute Basophils 12/17/2021 0.0  0.0 - 0.1 10*9/L Final    Anisocytosis 12/17/2021 Marked (A)  Not Present Final

## 2021-12-17 NOTE — Unmapped (Signed)
Labs found to be within parameters for treatment today. Request for drug sent to pharmacy.  Labs are within parameters today for the patient to have their procedure. Patient sent on to procedure appointment.

## 2021-12-17 NOTE — Unmapped (Signed)
Pt. Arrived to chair 12 in outpatient infusion. SubQ injection given w/o complication. Education on injection given to pt. Pt c/o cough & respiratory s/s and request visit with APP, APP notified and to chairside to assess patient. Patient left infusion for IT chemo fluoro apt with no additional needs, AVS declined.

## 2021-12-17 NOTE — Unmapped (Unsigned)
Port accessed by RN Shelly Wood, labs drawn and sent.

## 2021-12-20 MED ORDER — LEVOFLOXACIN 500 MG TABLET
ORAL_TABLET | Freq: Every day | ORAL | 0 refills | 30.00000 days | Status: CP
Start: 2021-12-20 — End: 2022-01-19

## 2021-12-21 ENCOUNTER — Ambulatory Visit: Admit: 2021-12-21 | Discharge: 2021-12-22 | Payer: PRIVATE HEALTH INSURANCE

## 2021-12-21 ENCOUNTER — Encounter: Admit: 2021-12-21 | Discharge: 2021-12-22 | Payer: PRIVATE HEALTH INSURANCE

## 2021-12-21 LAB — CBC W/ AUTO DIFF
BASOPHILS ABSOLUTE COUNT: 0 10*9/L (ref 0.0–0.1)
BASOPHILS RELATIVE PERCENT: 0 %
EOSINOPHILS ABSOLUTE COUNT: 0 10*9/L (ref 0.0–0.5)
EOSINOPHILS RELATIVE PERCENT: 15.9 %
HEMATOCRIT: 23.9 % — ABNORMAL LOW (ref 34.0–44.0)
HEMOGLOBIN: 8.2 g/dL — ABNORMAL LOW (ref 11.3–14.9)
LYMPHOCYTES ABSOLUTE COUNT: 0.2 10*9/L — ABNORMAL LOW (ref 1.1–3.6)
LYMPHOCYTES RELATIVE PERCENT: 81.5 %
MEAN CORPUSCULAR HEMOGLOBIN CONC: 34.1 g/dL (ref 32.0–36.0)
MEAN CORPUSCULAR HEMOGLOBIN: 30.3 pg (ref 25.9–32.4)
MEAN CORPUSCULAR VOLUME: 88.7 fL (ref 77.6–95.7)
MEAN PLATELET VOLUME: 7 fL (ref 6.8–10.7)
MONOCYTES ABSOLUTE COUNT: 0 10*9/L — ABNORMAL LOW (ref 0.3–0.8)
MONOCYTES RELATIVE PERCENT: 0.3 %
NEUTROPHILS ABSOLUTE COUNT: 0 10*9/L — CL (ref 1.8–7.8)
NEUTROPHILS RELATIVE PERCENT: 2.3 %
PLATELET COUNT: 19 10*9/L — ABNORMAL LOW (ref 150–450)
RED BLOOD CELL COUNT: 2.7 10*12/L — ABNORMAL LOW (ref 3.95–5.13)
RED CELL DISTRIBUTION WIDTH: 21 % — ABNORMAL HIGH (ref 12.2–15.2)
WBC ADJUSTED: 0.2 10*9/L — CL (ref 3.6–11.2)

## 2021-12-21 LAB — COMPREHENSIVE METABOLIC PANEL
ALBUMIN: 3.9 g/dL (ref 3.4–5.0)
ALKALINE PHOSPHATASE: 59 U/L (ref 46–116)
ALT (SGPT): 36 U/L (ref 10–49)
ANION GAP: 9 mmol/L (ref 5–14)
AST (SGOT): 13 U/L (ref <=34)
BILIRUBIN TOTAL: 0.8 mg/dL (ref 0.3–1.2)
BLOOD UREA NITROGEN: 19 mg/dL (ref 9–23)
BUN / CREAT RATIO: 37
CALCIUM: 9.3 mg/dL (ref 8.7–10.4)
CHLORIDE: 105 mmol/L (ref 98–107)
CO2: 23 mmol/L (ref 20.0–31.0)
CREATININE: 0.51 mg/dL — ABNORMAL LOW
EGFR CKD-EPI (2021) FEMALE: >90 mL/min/{1.73_m2} (ref >=60)
GLUCOSE RANDOM: 152 mg/dL (ref 70–179)
POTASSIUM: 4 mmol/L (ref 3.4–4.8)
PROTEIN TOTAL: 7 g/dL (ref 5.7–8.2)
SODIUM: 137 mmol/L (ref 135–145)

## 2021-12-21 LAB — SLIDE REVIEW

## 2021-12-21 LAB — PLATELET COUNT: PLATELET COUNT: 35 10*9/L — ABNORMAL LOW (ref 150–450)

## 2021-12-21 LAB — LACTATE DEHYDROGENASE: LACTATE DEHYDROGENASE: 195 U/L (ref 120–246)

## 2021-12-21 MED ADMIN — sodium chloride (NS) 0.9 % infusion: 50 mL/h | INTRAVENOUS | @ 16:00:00

## 2021-12-21 MED ADMIN — heparin, porcine (PF) 100 unit/mL injection 500 Units: 500 [IU] | INTRAVENOUS | @ 21:00:00 | Stop: 2021-12-22

## 2021-12-21 NOTE — Unmapped (Signed)
[facility] Refill Coordination Note    Specialty Medication(s) to be Shipped:   Hematology/Oncology: Sprycel 70mg  ((DENIED))    Other medication(s) to be shipped: No additional medications requested for fill at this time     Patricia [name], DOB: 06/17/1976  Phone: [phone] (home)       All above HIPAA information was verified with patient.     Was a Nurse, learning disability used for this call? No    Completed refill call assessment today to schedule patient's medication shipment from the [facility] [phone]).  All relevant notes have been reviewed.     Specialty medication(s) and dose(s) confirmed: Patient reports changes to the regimen as follows: 1QD    Changes to medications: Patricia [name] reports no changes at this time.  Changes to insurance: No  New side effects reported not previously addressed with a pharmacist or physician: None reported  Questions for the pharmacist: No    Confirmed patient received a Conservation officer, historic buildings and a Surveyor, mining with first shipment. The patient will receive a drug information handout for each medication shipped and additional FDA Medication Guides as required.       DISEASE/MEDICATION-SPECIFIC INFORMATION        N/A    SPECIALTY MEDICATION ADHERENCE     Medication Adherence    Specialty Medication: Sprycel 70mg   Patient is on additional specialty medications: No              Were doses missed due to medication being on hold? No    Sprycel 70mg : 60+ days of medicine on hand       REFERRAL TO PHARMACIST     Referral to the pharmacist: Not needed      [facility]     Shipping address confirmed in Epic.     Delivery Scheduled: Patient declined refill at this time due to has 60+ days of medication.     Medication will be delivered via Next Day Courier to the prescription address in Epic [place].    Patricia [name]   [facility] Specialty Technician

## 2021-12-21 NOTE — Unmapped (Addendum)
You take the following medications to prevent infections. These are the ones that seem to upset your stomach the most.     Bacterial: Levofloxacin (levaquin) 500mg  PO daily (when absolute neutrophils </= 0.5)   Fungal: Fluconazole (diflucan) 400mg  PO daily (when absolute neutrophils </= 0.5   PJP (pneumocystis pneumonia):  For SMX/TMP (bactrim) intolerant patients:  Dapsone 100mg  PO daily   Viral: Valacyclovir 500mg  PO daily (Continuous)     You will go on and off your fluconazole and levaquin based on your ANC (absolute neutrophil count). You absolutely need these medications when your ANC is less than 0.5. When your ANC is greater than 0.5, you will be instructed to hold those medications. If your blood counts (ANC and platelets) have not recovered before your next scheduled admission, it will likely be delayed several days to a week to give your body time to recover those blood levels. This makes planning definite start days for your cycles tricky. We will communicate changes with you when we know what they will be.    The pharmacist recommends taking your dasatinib (sprycel) with a carbonated beverage since you are going to start taking pepcid. This will aid in helping the medication break down and be fully absorbed.     You should start famotidine (pepcid) 20mg  by mouth 2 hours after taking dasatinib in the morning. Example- dasatinib by 9am. Pepcid at 11am.     After taking pepcid, wait 30 minutes and have a meal. This can be a small meal/snack or even a protein shake. Just something in your stomach.     Then take your fluconazole.     Later in the afternoon, you are welcome to take pepto, eat a small meal and take your levaquin and dapsone.     In the evening, take another dose of famotidine (pepcid) 20mg  if your symptoms still persist.     For cough-  Pick up your tessalon perles  Ok to use sudafed (pseudoephedrine) or phenylephrine  Would recommend trial of flonase nasal steroid  Take zyrtec daily  Ok to use OTC cough syrup such as delsym (dextromethorphan)    Your inpatient chemotherapy treatments rotate. This previous cycle you received- Rituximab (IV), Methotrexate (IV), and Cytarabine (IV).    This next cycle coming up you will receive- Dexamethasone (oral), Cyclophosphamide (IV), Vincristine (IV), and Doxorubicin (IV).    Then the treatments continue to rotate.     When your ANC is less than 0.5, you should avoid taking tylenol regularly. We don't want to mask a true fever with a fever suppressing medication. If you need an intermittent dose of tylenol, check your temperature first and only take if reading is normal. If your temperature is elevated, you need to wait on the tylenol and continue to monitor your temperature. If it gets up to 100.36F you need to call [phone] during the day and [phone] in the evening and weekends. If your temperature returns to normal, you can take a dose of tylenol.        Hospital Outpatient Visit on 12/21/2021   Component Date Value Ref Range Status    ABO Grouping 12/21/2021 O POS   Final    Antibody Screen 12/21/2021 NEG   Final    Sodium 12/21/2021 137  135 - 145 mmol/L Final    Potassium 12/21/2021 4.0  3.4 - 4.8 mmol/L Final    Chloride 12/21/2021 105  98 - 107 mmol/L Final    CO2 12/21/2021 23.0  20.0 - 31.0 mmol/L Final    Anion Gap 12/21/2021 9  5 - 14 mmol/L Final    BUN 12/21/2021 19  9 - 23 mg/dL Final    Creatinine 16/05/9603 0.51 (L)  0.60 - 0.80 mg/dL Final    BUN/Creatinine Ratio 12/21/2021 37   Final    eGFR CKD-EPI (2021) Female 12/21/2021 >90  >=60 mL/min/1.28m2 Final    eGFR calculated with CKD-EPI 2021 equation in accordance with [employer] and [employer] of Nephrology Task Force recommendations.    Glucose 12/21/2021 152  70 - 179 mg/dL Final    Calcium 54/04/8118 9.3  8.7 - 10.4 mg/dL Final    Albumin 14/78/2956 3.9  3.4 - 5.0 g/dL Final    Total Protein 12/21/2021 7.0  5.7 - 8.2 g/dL Final    Total Bilirubin 12/21/2021 0.8 0.3 - 1.2 mg/dL Final    AST 21/30/8657 13  <=34 U/L Final    ALT 12/21/2021 36  10 - 49 U/L Final    Alkaline Phosphatase 12/21/2021 59  46 - 116 U/L Final    LDH 12/21/2021 195  120 - 246 U/L Final    WBC 12/21/2021 0.2 (LL)  3.6 - 11.2 10*9/L Final    RBC 12/21/2021 2.70 (L)  3.95 - 5.13 10*12/L Final    HGB 12/21/2021 8.2 (L)  11.3 - 14.9 g/dL Final    HCT 84/69/6295 23.9 (L)  34.0 - 44.0 % Final    MCV 12/21/2021 88.7  77.6 - 95.7 fL Final    MCH 12/21/2021 30.3  25.9 - 32.4 pg Final    MCHC 12/21/2021 34.1  32.0 - 36.0 g/dL Final    RDW 28/41/3244 21.0 (H)  12.2 - 15.2 % Final    MPV 12/21/2021 7.0  6.8 - 10.7 fL Final    Platelet 12/21/2021 19 (L)  150 - 450 10*9/L Final    Neutrophils % 12/21/2021 2.3  % Final    Lymphocytes % 12/21/2021 81.5  % Final    Monocytes % 12/21/2021 0.3  % Final    Eosinophils % 12/21/2021 15.9  % Final    Basophils % 12/21/2021 0.0  % Final    Absolute Neutrophils 12/21/2021 0.0 (LL)  1.8 - 7.8 10*9/L Final    Absolute Lymphocytes 12/21/2021 0.2 (L)  1.1 - 3.6 10*9/L Final    Absolute Monocytes 12/21/2021 0.0 (L)  0.3 - 0.8 10*9/L Final    Absolute Eosinophils 12/21/2021 0.0  0.0 - 0.5 10*9/L Final    Absolute Basophils 12/21/2021 0.0  0.0 - 0.1 10*9/L Final    Anisocytosis 12/21/2021 Moderate (A)  Not Present Final    Smear Review Comments 12/21/2021 See Comment (A)  Undefined Final    [id number]  Slide reviewed.     Crossmatch 12/21/2021 Compatible   Final    Unit Blood Type 12/21/2021 O Pos   Final    ISBT Number 12/21/2021 5100   Final    Unit # 12/21/2021 [id number]   Final    Status 12/21/2021 Issued   Final    Spec Expiration 12/21/2021 [id number]   Final    Product ID 12/21/2021 Red Blood Cells   Final    PRODUCT CODE 12/21/2021 [id number]   Final    Crossmatch 12/21/2021 Compatible   Final    Unit Blood Type 12/21/2021 O Pos   Final    ISBT Number 12/21/2021 5100   Final    Unit # 12/21/2021 [id number]   Final    Status  12/21/2021 Issued   Final    Spec Expiration 12/21/2021 [id number]   Final    Product ID 12/21/2021 Red Blood Cells   Final    PRODUCT CODE 12/21/2021 [id number]   Final    Unit Blood Type 12/21/2021 O Pos   Final    ISBT Number 12/21/2021 5100   Final    Unit # 12/21/2021 [id number]   Final    Status 12/21/2021 Issued   Final    Product ID 12/21/2021 Platelets   Final    PRODUCT CODE 12/21/2021 [id number]   Final    Platelet 12/21/2021 35 (L)  150 - 450 10*9/L Final

## 2021-12-21 NOTE — Unmapped (Signed)
RN left voice mail for patient explaining [facility] is unable to guarantee all of her transfusion appointments, however we would see what we can accommodate.  However, all of her already scheduled appointments will not be changed, to ensure patient has appointments scheduled.

## 2021-12-21 NOTE — Unmapped (Signed)
Brief Adult Oncology Infusion Clinic Note:    [name] reports questions regarding her medications. She states she is on fluconazole, dapsone, and levaquin, but these particular medications upset her stomach. She quantifies this as lower colon burning and aching pain. She denies acid reflux type symptoms such as burning in her esophagus or stomach. She is not taking any medications to help with reflux and only intermittently takes pepto bismol. She does not like or use Tums.     Advised these are all prophylactic medications to prevent infections that she cannot stop taking. We must find a way for her to take them with fewer symptoms.     Plan-  --Start famotidine 20mg  PO daily 2 hours after sprycel. She currently takes sprycel between 8:30-9am. Advised pepcid at 11am or later. 30 minutes later, eat a small meal or snack. Then take fluconazole. Later in afternoon, can take pepto, eat a small meal/snack, then take levaquin and dapsone. If symptoms persist in the evening, can take 2nd dose of pecid 20mg .   --take sprycel with carbonated beverage to assist with absorption since starting pepcid  --to treat cough pick up tessalon, ok to take OTC medications sudafed, delsym, zyrtec, flonase. Complains mostly of post nasal drainage  --monitor for and report temperature greater than 100.52F    [name] verbalizes understanding instructions provided. Detailed instructions written for her continued review. She is aware it may take some time to find the right combination of medications to control her symptoms, so she needs to have a trial and error mentality. She has contact information for her primary team should she have further questions.       Lavonda Jumbo, AGNP-BC  [facility]

## 2021-12-22 ENCOUNTER — Emergency Department: Payer: BC Managed Care – PPO

## 2021-12-22 ENCOUNTER — Encounter: Payer: Self-pay | Admitting: *Deleted

## 2021-12-22 ENCOUNTER — Inpatient Hospital Stay
Admission: EM | Admit: 2021-12-22 | Discharge: 2021-12-27 | DRG: 871 | Disposition: A | Discharge status: Home / Self Care | Payer: BC Managed Care – PPO | Attending: Hospitalist | Admitting: Hospitalist

## 2021-12-22 ENCOUNTER — Other Ambulatory Visit: Payer: Self-pay

## 2021-12-22 DIAGNOSIS — J40 Bronchitis, not specified as acute or chronic: Secondary | ICD-10-CM

## 2021-12-22 DIAGNOSIS — C9100 Acute lymphoblastic leukemia not having achieved remission: Secondary | ICD-10-CM | POA: Diagnosis present

## 2021-12-22 DIAGNOSIS — N1330 Unspecified hydronephrosis: Secondary | ICD-10-CM

## 2021-12-22 DIAGNOSIS — D84821 Immunodeficiency due to drugs: Secondary | ICD-10-CM | POA: Diagnosis present

## 2021-12-22 DIAGNOSIS — D709 Neutropenia, unspecified: Secondary | ICD-10-CM | POA: Diagnosis not present

## 2021-12-22 DIAGNOSIS — D61810 Antineoplastic chemotherapy induced pancytopenia: Secondary | ICD-10-CM | POA: Diagnosis present

## 2021-12-22 DIAGNOSIS — Z882 Allergy status to sulfonamides status: Secondary | ICD-10-CM

## 2021-12-22 DIAGNOSIS — A419 Sepsis, unspecified organism: Secondary | ICD-10-CM | POA: Diagnosis not present

## 2021-12-22 DIAGNOSIS — N132 Hydronephrosis with renal and ureteral calculous obstruction: Secondary | ICD-10-CM | POA: Diagnosis present

## 2021-12-22 DIAGNOSIS — G8929 Other chronic pain: Secondary | ICD-10-CM | POA: Diagnosis present

## 2021-12-22 DIAGNOSIS — K449 Diaphragmatic hernia without obstruction or gangrene: Secondary | ICD-10-CM | POA: Diagnosis present

## 2021-12-22 DIAGNOSIS — I10 Essential (primary) hypertension: Secondary | ICD-10-CM | POA: Diagnosis present

## 2021-12-22 DIAGNOSIS — F32A Depression, unspecified: Secondary | ICD-10-CM | POA: Diagnosis present

## 2021-12-22 DIAGNOSIS — Z79899 Other long term (current) drug therapy: Secondary | ICD-10-CM

## 2021-12-22 DIAGNOSIS — Z818 Family history of other mental and behavioral disorders: Secondary | ICD-10-CM

## 2021-12-22 DIAGNOSIS — Y92239 Unspecified place in hospital as the place of occurrence of the external cause: Secondary | ICD-10-CM | POA: Diagnosis not present

## 2021-12-22 DIAGNOSIS — Z87891 Personal history of nicotine dependence: Secondary | ICD-10-CM

## 2021-12-22 DIAGNOSIS — R5081 Fever presenting with conditions classified elsewhere: Secondary | ICD-10-CM | POA: Diagnosis not present

## 2021-12-22 DIAGNOSIS — Z888 Allergy status to other drugs, medicaments and biological substances status: Secondary | ICD-10-CM

## 2021-12-22 DIAGNOSIS — Z20822 Contact with and (suspected) exposure to covid-19: Secondary | ICD-10-CM | POA: Diagnosis present

## 2021-12-22 DIAGNOSIS — D696 Thrombocytopenia, unspecified: Secondary | ICD-10-CM | POA: Diagnosis present

## 2021-12-22 DIAGNOSIS — T451X5A Adverse effect of antineoplastic and immunosuppressive drugs, initial encounter: Secondary | ICD-10-CM | POA: Diagnosis present

## 2021-12-22 DIAGNOSIS — F419 Anxiety disorder, unspecified: Secondary | ICD-10-CM | POA: Diagnosis present

## 2021-12-22 DIAGNOSIS — J181 Lobar pneumonia, unspecified organism: Secondary | ICD-10-CM | POA: Diagnosis present

## 2021-12-22 DIAGNOSIS — J189 Pneumonia, unspecified organism: Secondary | ICD-10-CM

## 2021-12-22 DIAGNOSIS — T380X5A Adverse effect of glucocorticoids and synthetic analogues, initial encounter: Secondary | ICD-10-CM | POA: Diagnosis not present

## 2021-12-22 DIAGNOSIS — D61818 Other pancytopenia: Secondary | ICD-10-CM

## 2021-12-22 LAB — COMPREHENSIVE METABOLIC PANEL
ALT: 28 U/L (ref 0–44)
AST: 16 U/L (ref 15–41)
Albumin: 3.9 g/dL (ref 3.5–5.0)
Alkaline Phosphatase: 52 U/L (ref 38–126)
Anion gap: 8 (ref 5–15)
BUN: 18 mg/dL (ref 6–20)
CO2: 24 mmol/L (ref 22–32)
Calcium: 9.3 mg/dL (ref 8.9–10.3)
Chloride: 102 mmol/L (ref 98–111)
Creatinine, Ser: 0.74 mg/dL (ref 0.44–1.00)
GFR, Estimated: >60 mL/min (ref >60)
Glucose, Bld: 112 mg/dL — ABNORMAL HIGH (ref 70–99)
Potassium: 3.9 mmol/L (ref 3.5–5.1)
Sodium: 134 mmol/L — ABNORMAL LOW (ref 135–145)
Total Bilirubin: 0.8 mg/dL (ref 0.3–1.2)
Total Protein: 7.5 g/dL (ref 6.5–8.1)

## 2021-12-22 LAB — PREGNANCY, URINE: Preg Test, Ur: NEGATIVE

## 2021-12-22 LAB — RESP PANEL BY RT-PCR (FLU A&B, COVID) ARPGX2
Influenza A by PCR: NEGATIVE
Influenza B by PCR: NEGATIVE
SARS Coronavirus 2 by RT PCR: NEGATIVE

## 2021-12-22 LAB — APTT: aPTT: 38 seconds — ABNORMAL HIGH (ref 24–36)

## 2021-12-22 LAB — LACTIC ACID, PLASMA: Lactic Acid, Venous: 1.1 mmol/L (ref 0.5–1.9)

## 2021-12-22 LAB — PROTIME-INR
INR: 1.1 (ref 0.8–1.2)
Prothrombin Time: 13.8 seconds (ref 11.4–15.2)

## 2021-12-22 MED ORDER — VANCOMYCIN HCL 1750 MG/350ML IV SOLN
1750.0000 mg | Freq: Once | INTRAVENOUS | Status: AC
Start: 2021-12-22 — End: 2021-12-23
  Administered 2021-12-22: 1750 mg via INTRAVENOUS
  Filled 2021-12-22: qty 350

## 2021-12-22 MED ORDER — SODIUM CHLORIDE 0.9 % IV SOLN
2.0000 g | Freq: Once | INTRAVENOUS | Status: AC
  Administered 2021-12-22: 2 g via INTRAVENOUS
  Filled 2021-12-22: qty 12.5

## 2021-12-22 MED ORDER — MORPHINE SULFATE (PF) 4 MG/ML IV SOLN
4.0000 mg | Freq: Once | INTRAVENOUS | Status: AC
  Administered 2021-12-22: 4 mg via INTRAVENOUS
  Filled 2021-12-22: qty 1

## 2021-12-22 MED ORDER — VANCOMYCIN HCL 1500 MG/300ML IV SOLN: 1500.0000 mg | Freq: Once | INTRAVENOUS | Status: DC

## 2021-12-22 MED ORDER — SODIUM CHLORIDE 0.9 % IV BOLUS (SEPSIS)
1000.0000 mL | Freq: Once | INTRAVENOUS | Status: AC
  Administered 2021-12-22: 1000 mL via INTRAVENOUS

## 2021-12-22 MED ORDER — SODIUM CHLORIDE 0.9 % IV BOLUS (SEPSIS)
500.0000 mL | Freq: Once | INTRAVENOUS | Status: AC
  Administered 2021-12-22: 500 mL via INTRAVENOUS

## 2021-12-22 MED ORDER — IOHEXOL 350 MG/ML SOLN
100.0000 mL | Freq: Once | INTRAVENOUS | Status: AC | PRN
  Administered 2021-12-22: 100 mL via INTRAVENOUS

## 2021-12-22 MED ORDER — ACETAMINOPHEN 500 MG PO TABS
1000.0000 mg | ORAL_TABLET | Freq: Once | ORAL | Status: AC
  Administered 2021-12-22: 1000 mg via ORAL
  Filled 2021-12-22: qty 2

## 2021-12-22 NOTE — ED Provider Notes (Signed)
[facility] Provider Note    Event Date/Time   First MD Initiated Contact with Patient 12/22/21 2131     (approximate)   History   Chief Complaint: Fever   HPI  Kathryn Black is a 46 y.o. female with a history of B-cell ALL being treated with chemotherapy at [facility] who comes the ED complaining of fever of 102 that started today, associated with chills.  Patient also reports very dry mouth and lips and feeling dehydrated.  Also reports nonproductive cough without chest pain or shortness of breath.  No vomiting.  Patient notes that she is also received multiple doses of intrathecal chemotherapy, most recently 5 days ago.  Most recent IV chemotherapy was 1 week ago, and she reports previous labs showed neutropenia.  She has some mild back pain in the area of fluoro-guided lumbar punctures that she received at [facility].  Denies lower extremity weakness or bowel or bladder incontinence or retention or saddle paresthesia.     Physical Exam   Triage Vital Signs: ED Triage Vitals  Enc Vitals Group     BP 12/22/21 2123 120/84     Pulse Rate 12/22/21 2123 (!) 139     Resp 12/22/21 2123 (!) 26     Temp 12/22/21 2123 (!) 102 F (38.9 C)     Temp Source 12/22/21 2123 Oral     SpO2 12/22/21 2123 90 %     Weight 12/22/21 2121 180 lb (81.6 kg)     Height 12/22/21 2121 '5\' 7"'$  (1.702 m)     Head Circumference --      Peak Flow --      Pain Score 12/22/21 2121 7     Pain Loc --      Pain Edu? --      Excl. in [place]? --     Most recent vital signs: Vitals:   12/22/21 2123 12/22/21 2239  BP: 120/84 117/78  Pulse: (!) 139 (!) 124  Resp: (!) 26 (!) 25  Temp: (!) 102 F (38.9 C) 100.3 F (37.9 C)  SpO2: 90% 99%    General: Awake, no distress.  CV:  Good peripheral perfusion.  Tachycardia heart rate 130.  Normal heart sounds Resp:  Normal effort.  Tachypnea, respiratory rate of 25.  Lungs clear to auscultation bilaterally Abd:  No distention.  Soft and  nontender Other:  No rash, no lower extremity edema.  Dry mucous membranes.   ED Results / Procedures / Treatments   Labs (all labs ordered are listed, but only abnormal results are displayed) Labs Reviewed  COMPREHENSIVE METABOLIC PANEL - Abnormal; Notable for the following components:      Result Value   Sodium 134 (*)    Glucose, Bld 112 (*)    All other components within normal limits  APTT - Abnormal; Notable for the following components:   aPTT 38 (*)    All other components within normal limits  RESP PANEL BY RT-PCR (FLU A&B, COVID) ARPGX2  CULTURE, BLOOD (ROUTINE X 2)  CULTURE, BLOOD (ROUTINE X 2)  URINE CULTURE  LACTIC ACID, PLASMA  PROTIME-INR  PREGNANCY, URINE  CBC WITH DIFFERENTIAL/PLATELET  URINALYSIS, COMPLETE (UACMP) WITH MICROSCOPIC     EKG Interpreted by me Sinus tachycardia rate 133.  Normal axis, normal intervals.  Normal QRS ST segments and T waves.   RADIOLOGY Chest x-ray viewed and interpreted by me, appears normal.  Radiology report reviewed.  CT angiogram chest pending CT abdomen pelvis pending  PROCEDURES:  .Critical Care Performed by: Carrie Mew, MD Authorized by: Carrie Mew, MD   Critical care provider statement:    Critical care time (minutes):  35   Critical care time was exclusive of:  Separately billable procedures and treating other patients   Critical care was necessary to treat or prevent imminent or life-threatening deterioration of the following conditions:  Sepsis and dehydration   Critical care was time spent personally by me on the following activities:  Development of treatment plan with patient or surrogate, discussions with consultants, evaluation of patient's response to treatment, examination of patient, obtaining history from patient or surrogate, ordering and performing treatments and interventions, ordering and review of laboratory studies, ordering and review of radiographic studies, pulse oximetry,  re-evaluation of patient's condition and review of old charts Comments:        .1-3 Lead EKG Interpretation Performed by: Carrie Mew, MD Authorized by: Carrie Mew, MD     Interpretation: abnormal     ECG rate:  130   ECG rate assessment: tachycardic     Rhythm: sinus tachycardia     Ectopy: none     Conduction: normal     MEDICATIONS ORDERED IN ED: Medications  sodium chloride 0.9 % bolus 1,000 mL (0 mLs Intravenous Stopped 12/22/21 2352)    And  sodium chloride 0.9 % bolus 1,000 mL (0 mLs Intravenous Stopped 12/22/21 2352)    And  sodium chloride 0.9 % bolus 500 mL (500 mLs Intravenous New Bag/Given 12/22/21 2351)  vancomycin (VANCOREADY) IVPB 1750 mg/350 mL (1,750 mg Intravenous New Bag/Given 12/22/21 2350)  acetaminophen (TYLENOL) tablet 1,000 mg (has no administration in time range)  morphine (PF) 4 MG/ML injection 4 mg (has no administration in time range)  ceFEPIme (MAXIPIME) 2 g in sodium chloride 0.9 % 100 mL IVPB (0 g Intravenous Stopped 12/22/21 2335)  iohexol (OMNIPAQUE) 350 MG/ML injection 100 mL (100 mLs Intravenous Contrast Given 12/22/21 2325)     IMPRESSION / MDM / [place] / ED COURSE  I reviewed the triage vital signs and the nursing notes.                              Differential diagnosis includes, but is not limited to, neutropenic fever, pneumonia, UTI, bacteremia, sepsis, dehydration, electrolyte abnormality, anemia, viral illness  Patient's presentation is most consistent with acute presentation with potential threat to life or bodily function.  Patient presents with various symptoms which raise possibility of multiple different infectious sources.  Patient is highly likely to be neutropenic with fever, will need broad-spectrum IV antibiotic coverage and admission.  Will give IV fluids for hydration and start sepsis work-up.   ----------------------------------------- 11:55 PM on  12/22/2021 ----------------------------------------- COVID and flu negative, lactate normal, chemistry panel normal.  Chest x-ray unremarkable, so CT angiogram chest ordered to evaluate for PE.  CT abdomen pelvis to look for abdominal pathology/source of infection.  CT L-spine reformat to evaluate for perispinal fluid collection with recent intrathecal chemo treatment.  Patient will need to be admitted for further management of neutropenic fever including hydration, IV antibiotics, culture follow-up.      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   Carrie Mew, MD 12/22/21 2356

## 2021-12-22 NOTE — ED Triage Notes (Addendum)
Pt has a fever.  Dx with leukemia 3/23.  Fever started today.  Pt treated at [facility].  Pt has a port.    Pt reports chills.  Pt alert.

## 2021-12-22 NOTE — Unmapped (Signed)
[facility] Navigation: Follow-Up  Completed with: Patient     Oncology Patient Navigator (OPN) provided follow-up call to review previously discussed resources/identified barriers and evaluate current needs.     Pt acknowledges feeling overwhelmed with everything going on although denies any immediate needs/interventions. She is uncertain re: status of [facility] caregiver counseling (referral sent at time of previous call), so OPN will investigate and follow up as she continues to endorse benefit and desire for her husband to be well-supported.    Patient endorsed understanding of upcoming appointment times/location as well as how to contact medical team if needed.     Additional follow-up call will occur in the coming weeks.

## 2021-12-22 NOTE — Unmapped (Signed)
Patient presented for a lab check today, had stated that her HR was high and her BP was lower than her baseline; also shared that she had been feeling poorly for the past couple days. Hgb was 8.2, 2 units pRBC ordered and given per therapy plan. Patient also shared with this nurse that she had started her menses today, and that it was more liquidy and less clotty than normal and that she had been having blood tinging noted when blowing her nose. Platelet count was 19, confirmed with Lavonda Jumbo, NP and 1 unit platelets also given based on patient complaints and therapy plan. Platelet recount increased to 35 on recheck, no further interventions given at this time. Patient denied any pain, shortness of breath, nausea or itching while getting treatment; no s/s of reaction noted. Patient tolerated treatment well and was stable at time of discharge. Patient escorted to lobby, via wheelchair, by spouse.

## 2021-12-22 NOTE — ED Notes (Signed)
Pt to ED for fever that started today. Pt has leukemia, seen at [facility] yesterday and received 2 bags of blood and bag of platelets. Pt reports fever of 103.4 at home. Pt has chills, headache.   Pt is A&ox4.

## 2021-12-22 NOTE — Progress Notes (Signed)
CODE SEPSIS - PHARMACY COMMUNICATION  **Broad Spectrum Antibiotics should be administered within 1 hour of Sepsis diagnosis**  Time Code Sepsis Called/Page Received:  5/24 @ 2141  Antibiotics Ordered: Cefepime 2 gm, Vancomycin 1750 mg   Time of 1st antibiotic administration: Cefepime 2 gm IV X 1 on 5/24 @ 2229  Additional action taken by pharmacy:   If necessary, Name of Provider/Nurse Contacted:     Kathrynn Backstrom D ,PharmD Clinical Pharmacist  12/22/2021  10:45 PM

## 2021-12-22 NOTE — ED Provider Notes (Signed)
11:45 PM  Assumed care at shift change.  Patient is a 46 year old female with history of a LL currently receiving chemotherapy who presents to the emergency department with fever.  Labs, urine, CT is pending.  COVID, flu negative.  Chest x-ray reviewed and interpreted by myself and radiology shows no infiltrate.  Patient will need admission.  Suspect neutropenic fever.  1:05 AM  Pt's labs show white blood cell count of 0.5 with absolute neutrophil count of 0.  Hemoglobin 9.5 and platelets of 22,000.  She states she did receive packed red blood cells and platelets yesterday at [facility].  Lactic normal.  COVID and flu negative.  Urine is grossly bloody but she states she is on her menstrual cycle.  She states the bleeding has improved and we will need to collect another urine sample.  CTA of the chest as well as CT of the abdomen pelvis reviewed and interpreted by myself and radiology.  No PE but she does have a left lower lobe pneumonia.  She does report she has been coughing.  She also has signs of a possible pyelonephritis on the right.  She is getting broad coverage.  Will discuss with hospitalist for admission.    1:08 AM Consulted and discussed patient's case with hospitalist, Dr. Damita Dunnings.  I have recommended admission and consulting physician agrees and will place admission orders.  Patient (and family if present) agree with this plan.   I reviewed all nursing notes, vitals, pertinent previous records.  All labs, EKGs, imaging ordered have been independently reviewed and interpreted by myself.     CRITICAL CARE Performed by: Pryor Curia   Total critical care time: 40 minutes  Critical care time was exclusive of separately billable procedures and treating other patients.  Critical care was necessary to treat or prevent imminent or life-threatening deterioration.  Critical care was time spent personally by me on the following activities: development of treatment plan with patient and/or  surrogate as well as nursing, discussions with consultants, evaluation of patient's response to treatment, examination of patient, obtaining history from patient or surrogate, ordering and performing treatments and interventions, ordering and review of laboratory studies, ordering and review of radiographic studies, pulse oximetry and re-evaluation of patient's condition.    Kiree Dejarnette, Delice Bison, DO 12/23/21 0109

## 2021-12-22 NOTE — Progress Notes (Signed)
PHARMACY -  BRIEF ANTIBIOTIC NOTE   Pharmacy has received consult(s) for Vancomycin, Cefepime from an ED provider.  The patient's profile has been reviewed for ht/wt/allergies/indication/available labs.    One time order(s) placed for Vancomycin 1750 mg IV X 1 and Cefepime 2 gm IV X 1.   Further antibiotics/pharmacy consults should be ordered by admitting physician if indicated.                       Thank you, Shaquera Ansley D 12/22/2021  9:48 PM

## 2021-12-22 NOTE — Sepsis Progress Note (Signed)
Following per sepsis protocol   

## 2021-12-23 DIAGNOSIS — Z87891 Personal history of nicotine dependence: Secondary | ICD-10-CM | POA: Diagnosis not present

## 2021-12-23 DIAGNOSIS — Z882 Allergy status to sulfonamides status: Secondary | ICD-10-CM | POA: Diagnosis not present

## 2021-12-23 DIAGNOSIS — R5081 Fever presenting with conditions classified elsewhere: Secondary | ICD-10-CM | POA: Diagnosis not present

## 2021-12-23 DIAGNOSIS — N132 Hydronephrosis with renal and ureteral calculous obstruction: Secondary | ICD-10-CM | POA: Diagnosis present

## 2021-12-23 DIAGNOSIS — F32A Depression, unspecified: Secondary | ICD-10-CM | POA: Diagnosis present

## 2021-12-23 DIAGNOSIS — D696 Thrombocytopenia, unspecified: Secondary | ICD-10-CM | POA: Diagnosis present

## 2021-12-23 DIAGNOSIS — Y92239 Unspecified place in hospital as the place of occurrence of the external cause: Secondary | ICD-10-CM | POA: Diagnosis not present

## 2021-12-23 DIAGNOSIS — T451X5A Adverse effect of antineoplastic and immunosuppressive drugs, initial encounter: Secondary | ICD-10-CM | POA: Diagnosis present

## 2021-12-23 DIAGNOSIS — D709 Neutropenia, unspecified: Secondary | ICD-10-CM | POA: Diagnosis present

## 2021-12-23 DIAGNOSIS — Z79899 Other long term (current) drug therapy: Secondary | ICD-10-CM | POA: Diagnosis not present

## 2021-12-23 DIAGNOSIS — F419 Anxiety disorder, unspecified: Secondary | ICD-10-CM | POA: Diagnosis present

## 2021-12-23 DIAGNOSIS — D61818 Other pancytopenia: Secondary | ICD-10-CM

## 2021-12-23 DIAGNOSIS — J189 Pneumonia, unspecified organism: Secondary | ICD-10-CM | POA: Diagnosis not present

## 2021-12-23 DIAGNOSIS — Z20822 Contact with and (suspected) exposure to covid-19: Secondary | ICD-10-CM | POA: Diagnosis present

## 2021-12-23 DIAGNOSIS — I10 Essential (primary) hypertension: Secondary | ICD-10-CM | POA: Diagnosis present

## 2021-12-23 DIAGNOSIS — C9100 Acute lymphoblastic leukemia not having achieved remission: Secondary | ICD-10-CM | POA: Diagnosis present

## 2021-12-23 DIAGNOSIS — G8929 Other chronic pain: Secondary | ICD-10-CM | POA: Diagnosis present

## 2021-12-23 DIAGNOSIS — A419 Sepsis, unspecified organism: Secondary | ICD-10-CM | POA: Diagnosis present

## 2021-12-23 DIAGNOSIS — T380X5A Adverse effect of glucocorticoids and synthetic analogues, initial encounter: Secondary | ICD-10-CM | POA: Diagnosis not present

## 2021-12-23 DIAGNOSIS — D61810 Antineoplastic chemotherapy induced pancytopenia: Secondary | ICD-10-CM | POA: Diagnosis present

## 2021-12-23 DIAGNOSIS — J181 Lobar pneumonia, unspecified organism: Secondary | ICD-10-CM | POA: Diagnosis present

## 2021-12-23 DIAGNOSIS — Z818 Family history of other mental and behavioral disorders: Secondary | ICD-10-CM | POA: Diagnosis not present

## 2021-12-23 DIAGNOSIS — K449 Diaphragmatic hernia without obstruction or gangrene: Secondary | ICD-10-CM | POA: Diagnosis present

## 2021-12-23 DIAGNOSIS — D84821 Immunodeficiency due to drugs: Secondary | ICD-10-CM | POA: Diagnosis present

## 2021-12-23 DIAGNOSIS — N1330 Unspecified hydronephrosis: Secondary | ICD-10-CM

## 2021-12-23 DIAGNOSIS — Z888 Allergy status to other drugs, medicaments and biological substances status: Secondary | ICD-10-CM | POA: Diagnosis not present

## 2021-12-23 LAB — URINALYSIS, ROUTINE W REFLEX MICROSCOPIC
Bacteria, UA: NONE SEEN
Bilirubin Urine: NEGATIVE
Glucose, UA: NEGATIVE mg/dL
Ketones, ur: NEGATIVE mg/dL
Leukocytes,Ua: NEGATIVE
Nitrite: NEGATIVE
Protein, ur: NEGATIVE mg/dL
RBC / HPF: >50 RBC/hpf — ABNORMAL HIGH (ref 0–5)
Specific Gravity, Urine: 1.032 — ABNORMAL HIGH (ref 1.005–1.030)
Squamous Epithelial / HPF: NONE SEEN (ref 0–5)
pH: 5 (ref 5.0–8.0)

## 2021-12-23 LAB — CBC
HCT: 25.6 % — ABNORMAL LOW (ref 36.0–46.0)
Hemoglobin: 8.5 g/dL — ABNORMAL LOW (ref 12.0–15.0)
MCH: 28.9 pg (ref 26.0–34.0)
MCHC: 33.2 g/dL (ref 30.0–36.0)
MCV: 87.1 fL (ref 80.0–100.0)
Platelets: 21 10*3/uL — CL (ref 150–400)
RBC: 2.94 MIL/uL — ABNORMAL LOW (ref 3.87–5.11)
RDW: 17.5 % — ABNORMAL HIGH (ref 11.5–15.5)
WBC: 0.6 10*3/uL — CL (ref 4.0–10.5)
nRBC: 0 % (ref 0.0–0.2)

## 2021-12-23 LAB — RESPIRATORY PANEL BY PCR

## 2021-12-23 LAB — CBC WITH DIFFERENTIAL/PLATELET
Abs Immature Granulocytes: 0 10*3/uL (ref 0.00–0.07)
Basophils Absolute: 0 10*3/uL (ref 0.0–0.1)
Basophils Relative: 0 %
Eosinophils Absolute: 0 10*3/uL (ref 0.0–0.5)
Eosinophils Relative: 8 %
HCT: 28.7 % — ABNORMAL LOW (ref 36.0–46.0)
Hemoglobin: 9.5 g/dL — ABNORMAL LOW (ref 12.0–15.0)
Immature Granulocytes: 0 %
Lymphocytes Relative: 67 %
Lymphs Abs: 0.4 10*3/uL — ABNORMAL LOW (ref 0.7–4.0)
MCH: 29 pg (ref 26.0–34.0)
MCHC: 33.1 g/dL (ref 30.0–36.0)
MCV: 87.5 fL (ref 80.0–100.0)
Monocytes Absolute: 0.1 10*3/uL (ref 0.1–1.0)
Monocytes Relative: 23 %
Neutro Abs: 0 10*3/uL — CL (ref 1.7–7.7)
Neutrophils Relative %: 2 %
Platelets: 22 10*3/uL — CL (ref 150–400)
RBC: 3.28 MIL/uL — ABNORMAL LOW (ref 3.87–5.11)
RDW: 17.3 % — ABNORMAL HIGH (ref 11.5–15.5)
Smear Review: NORMAL
WBC: 0.5 10*3/uL — CL (ref 4.0–10.5)
nRBC: 0 % (ref 0.0–0.2)

## 2021-12-23 LAB — BASIC METABOLIC PANEL
Anion gap: 8 (ref 5–15)
BUN: 14 mg/dL (ref 6–20)
CO2: 21 mmol/L — ABNORMAL LOW (ref 22–32)
Calcium: 8.7 mg/dL — ABNORMAL LOW (ref 8.9–10.3)
Chloride: 110 mmol/L (ref 98–111)
Creatinine, Ser: 0.54 mg/dL (ref 0.44–1.00)
GFR, Estimated: >60 mL/min (ref >60)
Glucose, Bld: 106 mg/dL — ABNORMAL HIGH (ref 70–99)
Potassium: 3.8 mmol/L (ref 3.5–5.1)
Sodium: 139 mmol/L (ref 135–145)

## 2021-12-23 LAB — URINALYSIS, COMPLETE (UACMP) WITH MICROSCOPIC
Bacteria, UA: NONE SEEN
RBC / HPF: >50 RBC/hpf — ABNORMAL HIGH (ref 0–5)
Specific Gravity, Urine: 1.014 (ref 1.005–1.030)
Squamous Epithelial / HPF: NONE SEEN (ref 0–5)

## 2021-12-23 LAB — CORTISOL-AM, BLOOD: Cortisol - AM: 20.3 ug/dL (ref 6.7–22.6)

## 2021-12-23 LAB — PROCALCITONIN: Procalcitonin: <0.1 ng/mL

## 2021-12-23 LAB — PATHOLOGIST SMEAR REVIEW

## 2021-12-23 LAB — MRSA NEXT GEN BY PCR, NASAL: MRSA by PCR Next Gen: NOT DETECTED

## 2021-12-23 MED ORDER — SODIUM CHLORIDE 0.9 % IV SOLN
2.0000 g | Freq: Three times a day (TID) | INTRAVENOUS | Status: DC
  Administered 2021-12-23 – 2021-12-27 (×13): 2 g via INTRAVENOUS
  Filled 2021-12-23: qty 2
  Filled 2021-12-23: qty 12.5
  Filled 2021-12-23: qty 2
  Filled 2021-12-23: qty 12.5
  Filled 2021-12-23: qty 2
  Filled 2021-12-23 (×2): qty 12.5
  Filled 2021-12-23 (×6): qty 2
  Filled 2021-12-23 (×2): qty 12.5

## 2021-12-23 MED ORDER — IBUPROFEN 400 MG PO TABS
400.0000 mg | ORAL_TABLET | ORAL | Status: AC | PRN
  Administered 2021-12-23 (×2): 400 mg via ORAL
  Filled 2021-12-23 (×2): qty 1

## 2021-12-23 MED ORDER — VALACYCLOVIR HCL 500 MG PO TABS
500.0000 mg | ORAL_TABLET | Freq: Every day | ORAL | Status: DC
  Administered 2021-12-23 – 2021-12-27 (×5): 500 mg via ORAL
  Filled 2021-12-23 (×5): qty 1

## 2021-12-23 MED ORDER — CHLORHEXIDINE GLUCONATE CLOTH 2 % EX PADS
6.0000 | MEDICATED_PAD | Freq: Every day | CUTANEOUS | Status: DC
  Administered 2021-12-24 – 2021-12-26 (×3): 6 via TOPICAL

## 2021-12-23 MED ORDER — LORATADINE 10 MG PO TABS
10.0000 mg | ORAL_TABLET | Freq: Every day | ORAL | Status: DC
  Administered 2021-12-23 – 2021-12-27 (×5): 10 mg via ORAL
  Filled 2021-12-23 (×5): qty 1

## 2021-12-23 MED ORDER — LACTATED RINGERS IV SOLN: INTRAVENOUS | Status: AC

## 2021-12-23 MED ORDER — VANCOMYCIN HCL 750 MG/150ML IV SOLN
750.0000 mg | Freq: Three times a day (TID) | INTRAVENOUS | Status: DC
  Administered 2021-12-23: 750 mg via INTRAVENOUS
  Filled 2021-12-23 (×2): qty 150

## 2021-12-23 MED ORDER — ESCITALOPRAM OXALATE 10 MG PO TABS
10.0000 mg | ORAL_TABLET | Freq: Every day | ORAL | Status: DC
  Administered 2021-12-23 – 2021-12-27 (×5): 10 mg via ORAL
  Filled 2021-12-23 (×4): qty 1

## 2021-12-23 MED ORDER — BENZONATATE 100 MG PO CAPS
100.0000 mg | ORAL_CAPSULE | Freq: Three times a day (TID) | ORAL | Status: DC | PRN
  Administered 2021-12-23 – 2021-12-24 (×3): 100 mg via ORAL
  Filled 2021-12-23 (×3): qty 1

## 2021-12-23 MED ORDER — ONDANSETRON HCL 4 MG/2ML IJ SOLN: 4.0000 mg | Freq: Four times a day (QID) | INTRAMUSCULAR | Status: DC | PRN

## 2021-12-23 MED ORDER — FLUCONAZOLE 100 MG PO TABS
200.0000 mg | ORAL_TABLET | Freq: Every day | ORAL | Status: DC
  Administered 2021-12-23 – 2021-12-27 (×5): 200 mg via ORAL
  Filled 2021-12-23 (×5): qty 2

## 2021-12-23 MED ORDER — ONDANSETRON HCL 4 MG PO TABS: 4.0000 mg | ORAL_TABLET | Freq: Four times a day (QID) | ORAL | Status: DC | PRN

## 2021-12-23 MED ORDER — DEXTROMETHORPHAN POLISTIREX ER 30 MG/5ML PO SUER
30.0000 mg | Freq: Three times a day (TID) | ORAL | Status: DC | PRN
  Administered 2021-12-24: 30 mg via ORAL
  Filled 2021-12-23 (×2): qty 5

## 2021-12-23 NOTE — Assessment & Plan Note (Signed)
Patient recently treated with 2 courses of antibiotics for sinusitis

## 2021-12-23 NOTE — Unmapped (Signed)
Recent:   What is the date of your last related visit?  Seen 1 day ago Blood transfusion  Related acute medications Rx'd:  NA  Home treatment tried:  NA      Relevant:   Allergies: Erythromycin, Sulfa (sulfonamide antibiotics), and Azithromycin  Medications: NA   Health History: ALL in remission  Weight: NA      [place] [facility] patients only:  What was the date of your last cancer treatment (mm/dd/yy)?: 12/17/21  Was the treatment oral or infusion? pills  Are you currently on TVEC (yes/no)?: NA    Reason for Disposition   Patient sounds very sick or weak to the triager    Answer Assessment - Initial Assessment Questions  1. ONSET: When did the cough begin?       2 weeks ago  2. SEVERITY: How bad is the cough today?       It is not as bad as before.  3. SPUTUM: Describe the color of your sputum (none, dry cough; clear, white, yellow, green)      I can not get it out.  4. HEMOPTYSIS: Are you coughing up any blood? If so ask: How much? (flecks, streaks, tablespoons, etc.)      Denies  5. DIFFICULTY BREATHING: Are you having difficulty breathing? If Yes, ask: How bad is it? (e.g., mild, moderate, severe)     - MILD: No SOB at rest, mild SOB with walking, speaks normally in sentences, can lie down, no retractions, pulse < 100.     - MODERATE: SOB at rest, SOB with minimal exertion and prefers to sit, cannot lie down flat, speaks in phrases, mild retractions, audible wheezing, pulse 100-120.     - SEVERE: Very SOB at rest, speaks in single words, struggling to breathe, sitting hunched forward, retractions, pulse > 120       Mild  6. FEVER: Do you have a fever? If Yes, ask: What is your temperature, how was it measured, and when did it start?      102.F oral  7. CARDIAC HISTORY: Do you have any history of heart disease? (e.g., heart attack, congestive heart failure)       Denies  8. LUNG HISTORY: Do you have any history of lung disease?  (e.g., pulmonary embolus, asthma, emphysema)      Denies  9. PE RISK FACTORS: Do you have a history of blood clots? (or: recent major surgery, recent prolonged travel, bedridden)      Yes in the   10. OTHER SYMPTOMS: Do you have any other symptoms? (e.g., runny nose, wheezing, chest pain)     Post nasal drip, cough, HA 7-10/10, reports having a low WBC count and a port a cath in place.  11. PREGNANCY: Is there any chance you are pregnant? When was your last menstrual period?        NA  12. TRAVEL: Have you traveled out of the country in the last month? (e.g., travel history, exposures)        Denies    Protocols used: Cough - Acute Productive-A-AH

## 2021-12-23 NOTE — Unmapped (Signed)
Brief Adult Oncology Infusion Clinic Note:  Cough  Patricia [name] reports ongoing cough and is wondering if she should continue the OTC Mucinex which she has been taking for a couple of days. Cough is mostly dry with occasional clear sputum. She was started on benzonatate yesterday but she has not been able to pick up the prescription yet.   Plan:   Pt's p/exam is unremarkable.   Advised to start on the benzonatate when she picks it up today.   Will reach out to her team if symptoms worsen    Results:  Hospital Outpatient Visit on 12/17/2021   Component Date Value Ref Range Status   ??? Protein, CSF 12/17/2021 25  20 - 59 mg/dL Final   ??? Glucose, CSF 12/17/2021 68  48 - 79 mg/dL Final   ??? Tube # CSF 12/17/2021 Tube 1   Final   ??? Color, CSF 12/17/2021 Colorless   Final   ??? Appearance, CSF 12/17/2021 Clear   Final   ??? Nucleated Cells, CSF 12/17/2021 0  0 - 5 ul Final   ??? RBC, CSF 12/17/2021 107 (H)  0 ul Final   ??? #Cells counted for [facility] 12/17/2021 13   Final    This is an appended report.  These results have been appended to a previously final verified report.   ??? Neutrophil %, CSF 12/17/2021 7.7  % Final    This is an appended report.  These results have been appended to a previously final verified report.   ??? Lymphs %, CSF 12/17/2021 69.2  % Final    This is an appended report.  These results have been appended to a previously final verified report.   ??? Mono/Macrophage %, CSF 12/17/2021 23.1  % Final    This is an appended report.  These results have been appended to a previously final verified report.   ??? Diagnosis 12/17/2021    Final                    Value:This result contains rich text formatting which cannot be displayed here.   ??? Clinical History 12/17/2021    Final                    Value:This result contains rich text formatting which cannot be displayed here.   ??? Gross Description 12/17/2021    Final                    Value:This result contains rich text formatting which cannot be displayed here.   ??? Microscopic Description 12/17/2021    Final                    Value:This result contains rich text formatting which cannot be displayed here.   ??? Disclaimer 12/17/2021    Final                    Value:This result contains rich text formatting which cannot be displayed here.   Lab on 12/17/2021   Component Date Value Ref Range Status   ??? WBC 12/17/2021 2.4 (L)  3.6 - 11.2 10*9/L Final   ??? RBC 12/17/2021 3.07 (L)  3.95 - 5.13 10*12/L Final   ??? HGB 12/17/2021 9.5 (L)  11.3 - 14.9 g/dL Final   ??? HCT 01/22/7627 27.2 (L)  34.0 - 44.0 % Final   ??? MCV 12/17/2021 88.5  77.6 - 95.7 fL Final   ??? MCH  12/17/2021 30.8  25.9 - 32.4 pg Final   ??? MCHC 12/17/2021 34.8  32.0 - 36.0 g/dL Final   ??? RDW 16/05/9603 22.2 (H)  12.2 - 15.2 % Final   ??? MPV 12/17/2021 6.4 (L)  6.8 - 10.7 fL Final   ??? Platelet 12/17/2021 140 (L)  150 - 450 10*9/L Final   ??? Neutrophils % 12/17/2021 83.6  % Final   ??? Lymphocytes % 12/17/2021 11.7  % Final   ??? Monocytes % 12/17/2021 0.0  % Final   ??? Eosinophils % 12/17/2021 4.1  % Final   ??? Basophils % 12/17/2021 0.6  % Final   ??? Absolute Neutrophils 12/17/2021 2.0  1.8 - 7.8 10*9/L Final   ??? Absolute Lymphocytes 12/17/2021 0.3 (L)  1.1 - 3.6 10*9/L Final   ??? Absolute Monocytes 12/17/2021 0.0 (L)  0.3 - 0.8 10*9/L Final   ??? Absolute Eosinophils 12/17/2021 0.1  0.0 - 0.5 10*9/L Final   ??? Absolute Basophils 12/17/2021 0.0  0.0 - 0.1 10*9/L Final   ??? Anisocytosis 12/17/2021 Marked (A)  Not Present Final      General:  resting in no acute distress.  HEENT: Normocephalic and atraumatic. No scleral icterus or conjunctival injection. Oral mucosa moist without ulceration, erythema, exudate or purpura. Nares show no bleeding.    Neck:  Supple. Trachea midline  Cardiovascular:  Regular rate, & rhythm. S1/S2 No significant murmurs or gallops  Respiratory: Breathing is unlabored and patient is speaking full sentences with ease. No stridor. Auscultation of lung fields reveals normal air movement without wheezing, ronchi or crackles.   Extremities: Lower extremities are warm and without edema. Distal pulses are full and symmetric. No clubbing, or cyanosis.  Psychiatric:  Alert and oriented to person, place, time and situation. Range of affect is appropriate.     Leeroy Cha, FNP

## 2021-12-23 NOTE — Unmapped (Signed)
Encounter addended by: Ivan Croft, FNP on: 12/22/2021 7:39 PM   Actions taken: Clinical Note Signed

## 2021-12-23 NOTE — Assessment & Plan Note (Signed)
Followed by [employer]. She was started on Lidocaine patches and Voltaren gel during recent [facility] admission. Reported she is no longer using Oxycodone PRN. She has follow up with pain clinic on 6/1

## 2021-12-23 NOTE — Progress Notes (Signed)
Pharmacy Antibiotic Note  Kathryn Black is a 46 y.o. female admitted on 12/22/2021 with pneumonia.  Pharmacy has been consulted for Vancomycin, Cefepime  dosing.  Plan: Cefepime 2 gm IV X 1 given in ED on 5/24 @ 2229. Cefepime 2 gm IV Q8H ordered to start on 5/25 @ 0700.   Vancomycin 1750 mg IV X 1 give on 5/24 @ 2350. Vancomycin 750 mg IV Q8H ordered to start on 5/25 @ 0800.  AUC = 497.9  Vanc trough = 16.3   Height: '5\' 7"'$  (170.2 cm) Weight: 82.5 kg (181 lb 14.1 oz) IBW/kg (Calculated) : 61.6  Temp (24hrs), Avg:99.8 F (37.7 C), Min:98.4 F (36.9 C), Max:102 F (38.9 C)  Recent Labs  Lab 12/22/21 2222  WBC 0.5*  CREATININE 0.74  LATICACIDVEN 1.1    Estimated Creatinine Clearance: 98.1 mL/min (by C-G formula based on SCr of 0.74 mg/dL).    Allergies  Allergen Reactions   Other     Pt cant take any pain reliever or fever reducer due to condition.   Sulfa Antibiotics Anaphylaxis   Erythromycin Hives   Zithromax [Azithromycin]     Antimicrobials this admission:   >>    >>   Dose adjustments this admission:   Microbiology results:  BCx:   UCx:    Sputum:    MRSA PCR:   Thank you for allowing pharmacy to be a part of this patient's care.  Hassani Sliney D 12/23/2021 5:24 AM

## 2021-12-23 NOTE — H&P (Addendum)
History and Physical    Patient: Kathryn Black DOB: 1975/11/12 DOA: 12/22/2021 DOS: the patient was seen and examined on 12/23/2021 PCP: Jon Billings, NP  Patient coming from: Home  Chief Complaint:  Chief Complaint  Patient presents with   Fever    HPI: Kathryn Black is a 46 y.o. 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  Patient states her cough was ongoing while at [facility].  Review of the discharge summary reveals that an x-ray was done on 5/13 and was clear without evidence of infection and she was treated with Zyrtec and Levaquin.  She had previously completed a course of Augmentin from 5/8 to 5/15..  Gets labs twice weekly and was seen at the infusion clinic on 5/23 where she received 2 units of PRBC for hemoglobin of 8.2 and a unit of platelets for platelet count of 19,000 improving to 35,000 on recheck.  She has an appointment with her oncologist on 6/1. ED course and data review: Temperature 102, pulse 139 and respirations 26 on arrival with BP 120/84 and O2 sats 90% on room air.  CBC with WBC of 505 and ANC of 0, hemoglobin 9.5 and platelets 22,000.  CMP unremarkable, lactic acid 1.1, INR 1.1.  Urinalysis not reported due to pigments in urine (patient on her cycle).  EKG, personally viewed and interpreted: Sinus tachycardia at 130 with no acute ST-T wave changes.  Imaging studies including CTA chest PE protocol, CT abdomen and pelvis and CT L-spine significant for the following: IMPRESSION: No pulmonary embolism.   Left lower lobe consolidation in keeping with acute lobar  pneumonia in the appropriate clinical setting.   Small hiatal hernia.   Mild bilateral nonobstructing nephrolithiasis.   Mild right hydronephrosis. No definite obstructing lesion or calculus identified. This may relate to a recently passed calculus or reflect a superimposed inflammatory process such as pyelonephritis. Correlation with urinalysis and urine culture may be helpful.  Patient started on cefepime and vancomycin and given IV fluid boluses per sepsis protocol.  Hospitalist consulted for admission.   Review of Systems: As mentioned in the history of present illness. All other systems reviewed and are negative. Past Medical History:  Diagnosis Date   Anxiety    no meds   Asthma    childhood-allergy induced   Depression    no meds   Headache(784.0)    Hypertension    Kidney stone    Past Surgical History:  Procedure Laterality Date   BREAST BIOPSY  04/01/1989   in office procedure under local   CYSTOSCOPY WITH URETEROSCOPY  11/23/2011   Procedure: CYSTOSCOPY WITH URETEROSCOPY;  Surgeon: Franchot Gallo, MD;  Location: [facility];  Service: Urology;  Laterality: Right;  WITH STONE obtained   KIDNEY STONE SURGERY     LEEP     in office procedure   Social History:  reports that she has quit smoking. Her smoking use included cigarettes. She smoked an average of .25 packs per day. She has never used smokeless tobacco. She reports current alcohol use. She reports that she does not use drugs.  Allergies  Allergen Reactions   Other     Pt cant take any pain reliever or fever reducer due to condition.   Sulfa Antibiotics Anaphylaxis   Erythromycin Hives   Zithromax [Azithromycin]     Family History  Problem Relation Age of Onset   Depression Sister    Hashimoto's thyroiditis Sister     Prior to Admission medications   Medication Sig Start Date End Date Taking? Authorizing Provider  albuterol (VENTOLIN HFA) 108 (90 Base) MCG/ACT inhaler Inhale  1-2 puffs into the lungs every 4 (four) hours as needed.   Yes [provider]  benzonatate (TESSALON) 100 MG capsule Take 100 mg by mouth 3 (three) times daily as needed. 12/16/21  Yes [provider]  cetirizine (ZYRTEC) 10 MG tablet Take 10 mg by mouth daily. 11/09/21  Yes [provider]  dapsone 100 MG tablet Take 100 mg by mouth daily. 12/14/21  Yes [provider]  escitalopram (LEXAPRO) 10 MG tablet Take 1 tablet (10 mg total) by mouth daily. 08/27/21  Yes Jon Billings, NP  fluconazole (DIFLUCAN) 200 MG tablet Take 200 mg by mouth daily. 12/14/21  Yes [provider]  levofloxacin (LEVAQUIN) 500 MG tablet Take 500 mg by mouth daily. 12/14/21  Yes [provider]  SPRYCEL 70 MG tablet Take 70 mg by mouth daily. 11/29/21  Yes [provider]  valACYclovir (VALTREX) 500 MG tablet Take 500 mg by mouth daily. 12/02/21  Yes [provider]  acetaminophen (TYLENOL) 650 MG CR tablet Take by mouth. Patient not taking: Reported on 12/22/2021    [provider]  butalbital-acetaminophen-caffeine (FIORICET) 50-325-40 MG tablet Take by mouth. Patient not taking: Reported on 12/22/2021 11/05/21   [provider]  dexamethasone (DECADRON) 4 MG tablet Take by mouth. Patient not taking: Reported on 12/22/2021 11/05/21   [provider]  levofloxacin (LEVAQUIN) 750 MG tablet Take 750 mg by mouth daily. Patient not taking: Reported on 12/22/2021 12/14/21   [provider]  LORazepam (ATIVAN) 0.5 MG tablet Take by mouth. Patient not taking: Reported on 12/22/2021 11/05/21   [provider]  oxyCODONE (OXY IR/ROXICODONE) 5 MG immediate release tablet Take by mouth. Patient not taking: Reported on 12/22/2021 11/05/21   [provider]  prednisoLONE acetate (PRED FORTE) 1 % ophthalmic suspension SMARTSIG:In Eye(s) Patient not taking: Reported on 12/22/2021 12/14/21   [provider]   prochlorperazine (COMPAZINE) 10 MG tablet Take by mouth. Patient not taking: Reported on 12/22/2021 11/05/21   [provider]    Physical Exam: Vitals:   12/22/21 2121 12/22/21 2123 12/22/21 2239 12/23/21 0000  BP:  120/84 117/78 131/81  Pulse:  (!) 139 (!) 124 (!) 129  Resp:  (!) 26 (!) 25 (!) 24  Temp:  (!) 102 F (38.9 C) 100.3 F (37.9 C)   TempSrc:  Oral Oral   SpO2:  90% 99% 99%  Weight: 81.6 kg     Height: '5\' 7"'$  (1.702 m)      Physical Exam Vitals and nursing note reviewed.  Constitutional:      General: She is not in acute distress. HENT:     Head: Normocephalic and atraumatic.  Cardiovascular:     Rate and Rhythm: Regular rhythm. Tachycardia present.     Heart sounds: Normal heart sounds.  Pulmonary:     Effort: Pulmonary effort is normal. Tachypnea present.     Breath sounds: Examination of the left-lower field reveals wheezing. Wheezing present.  Abdominal:  Palpations: Abdomen is soft.     Tenderness: There is no abdominal tenderness.  Neurological:     Mental Status: Mental status is at baseline.     Data Reviewed: Relevant notes from primary care and specialist visits, past discharge summaries as available in EHR, including Care Everywhere. Prior diagnostic testing as pertinent to current admission diagnoses Updated medications and problem lists for reconciliation ED course, including vitals, labs, imaging, treatment and response to treatment Triage notes, nursing and pharmacy notes and ED provider's notes Notable results as noted in HPI   Assessment and Plan: * LLL pneumonia Cough x2 weeks with shortness of breath and fever and chest CT showing left lower lobe consolidation.  Not hypoxic Continue cefepime and vancomycin Antitussives, decongestions and supportive care Supplemental O2 if needed.  DuoNebs as needed  Sepsis ([place]) Sepsis criteria includes fever, tachycardia and tachypnea Received IV fluid bolus in the ED Continue sepsis  fluids per protocol and treat pneumonia as above  Hydronephrosis, right "CT abdomen showing mild bilateral nonobstructing nephrolithiasis.  Mild right hydronephrosis. No definite obstructing lesion or calculus identified. This may relate to a recently passed calculus or reflect a superimposed inflammatory process such as pyelonephritis. Correlation with urinalysis and urine culture may be Helpful". 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 Continue prophylaxis with Valtrex and fluconazole.  We will hold Levaquin and dapsone as patient is on Vanco and cefepime Consult oncology in the a.m.  Pancytopenia (HCC) Hb 9.5.  Patient is s/p 1 unit PRBC on 5/23 for hemoglobin of 8.2 WBC 505 and ANC of 0 Platelets 22,000, down from 35,000 on 5/23 posttransfusion of 1 unit platelets for count of 19,000 Patient currently on her cycle and having vaginal bleeding Monitor CBC and platelets and transfuse as needed Continue fluconazole and Valtrex prophylaxis Neutropenic precautions Oncology consult in the a.m.   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 She was started on Lidocaine patches and Voltaren gel during recent [facility] admission. Reported she is no longer using Oxycodone PRN. She has follow up with pain clinic on 6/1    Advance Care Planning:   Code Status: Prior   Consults: none  Family Communication: none  Severity of Illness: The appropriate patient status for this patient is INPATIENT. Inpatient status is judged to be reasonable and necessary in order to provide the required intensity of service to ensure  the patient's safety. The patient's presenting symptoms, physical exam findings, and initial radiographic and laboratory data in the context of their chronic comorbidities is felt to place them at high risk for further clinical deterioration. Furthermore, it is not anticipated that the patient will be medically stable for discharge from the hospital within 2 midnights of admission.   * I certify that at the point of admission it is my clinical judgment that the patient will require inpatient hospital care spanning beyond 2 midnights from the point of admission due to high intensity of service, high risk for further deterioration and high frequency of surveillance required.*  Author: Athena Masse, MD 12/23/2021 1:28 AM  For on call review [email or web address].

## 2021-12-23 NOTE — Assessment & Plan Note (Addendum)
--  due to chemo Plan: --monitor cell counts --cont fluconazole and valacyclovir

## 2021-12-23 NOTE — Assessment & Plan Note (Addendum)
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 Plan: Continue prophylaxis with Valtrex and fluconazole.   --hold Levaquin since patient is on cefepime --Consulted oncology, Dr. Grayland Ormond following

## 2021-12-23 NOTE — Progress Notes (Signed)
  Transition of Care [facility]) Screening Note   Patient Details  Name: Kathryn Black Date of Birth: April 15, 1976   Transition of Care [facility]) CM/SW Contact:    Alberteen Sam, LCSW Phone Number: 12/23/2021, 12:00 PM    Transition of Care Department [facility]) has reviewed patient and no TOC needs have been identified at this time. We will continue to monitor patient advancement through interdisciplinary progression rounds. If new patient transition needs arise, please place a TOC consult.  [place], [place]

## 2021-12-23 NOTE — Assessment & Plan Note (Addendum)
Cough x2 weeks with shortness of breath and fever and chest CT showing left lower lobe consolidation.  Not hypoxic. --started on cefepime and vancomycin, but vanc d/c'ed after MRSA screen neg Plan: --cont cefepime for 5 days

## 2021-12-23 NOTE — Assessment & Plan Note (Addendum)
"  CT abdomen showing mild bilateral nonobstructing nephrolithiasis.  Mild right hydronephrosis. No definite obstructing lesion or calculus identified. This may relate to a recently passed calculus or reflect a superimposed inflammatory process such as pyelonephritis. Correlation with urinalysis and urine culture may be Helpful". --UA not interpretable due to color interference

## 2021-12-23 NOTE — Assessment & Plan Note (Signed)
Sepsis criteria includes fever, tachycardia and tachypnea Received IV fluid bolus in the ED Continue sepsis fluids per protocol and treat pneumonia as above

## 2021-12-24 DIAGNOSIS — C9100 Acute lymphoblastic leukemia not having achieved remission: Secondary | ICD-10-CM

## 2021-12-24 DIAGNOSIS — J189 Pneumonia, unspecified organism: Secondary | ICD-10-CM | POA: Diagnosis not present

## 2021-12-24 LAB — URINE CULTURE: Culture: NO GROWTH

## 2021-12-24 LAB — CBC WITH DIFFERENTIAL/PLATELET
Abs Immature Granulocytes: 0.21 10*3/uL — ABNORMAL HIGH (ref 0.00–0.07)
Basophils Absolute: 0 10*3/uL (ref 0.0–0.1)
Basophils Relative: 0 %
Eosinophils Absolute: 0.1 10*3/uL (ref 0.0–0.5)
Eosinophils Relative: 3 %
HCT: 23.7 % — ABNORMAL LOW (ref 36.0–46.0)
Hemoglobin: 7.9 g/dL — ABNORMAL LOW (ref 12.0–15.0)
Immature Granulocytes: 11 %
Lymphocytes Relative: 18 %
Lymphs Abs: 0.4 10*3/uL — ABNORMAL LOW (ref 0.7–4.0)
MCH: 29 pg (ref 26.0–34.0)
MCHC: 33.3 g/dL (ref 30.0–36.0)
MCV: 87.1 fL (ref 80.0–100.0)
Monocytes Absolute: 0.9 10*3/uL (ref 0.1–1.0)
Monocytes Relative: 43 %
Neutro Abs: 0.5 10*3/uL — ABNORMAL LOW (ref 1.7–7.7)
Neutrophils Relative %: 25 %
Platelets: 50 10*3/uL — ABNORMAL LOW (ref 150–400)
RBC: 2.72 MIL/uL — ABNORMAL LOW (ref 3.87–5.11)
RDW: 17.4 % — ABNORMAL HIGH (ref 11.5–15.5)
WBC: 2 10*3/uL — ABNORMAL LOW (ref 4.0–10.5)
nRBC: 0 % (ref 0.0–0.2)

## 2021-12-24 LAB — BASIC METABOLIC PANEL
Anion gap: 4 — ABNORMAL LOW (ref 5–15)
BUN: 17 mg/dL (ref 6–20)
CO2: 23 mmol/L (ref 22–32)
Calcium: 8.7 mg/dL — ABNORMAL LOW (ref 8.9–10.3)
Chloride: 109 mmol/L (ref 98–111)
Creatinine, Ser: 0.61 mg/dL (ref 0.44–1.00)
GFR, Estimated: >60 mL/min (ref >60)
Glucose, Bld: 98 mg/dL (ref 70–99)
Potassium: 3.5 mmol/L (ref 3.5–5.1)
Sodium: 136 mmol/L (ref 135–145)

## 2021-12-24 LAB — MAGNESIUM: Magnesium: 2 mg/dL (ref 1.7–2.4)

## 2021-12-24 MED ORDER — HYDROCODONE BIT-HOMATROP MBR 5-1.5 MG/5ML PO SOLN
5.0000 mL | ORAL | Status: DC | PRN
  Administered 2021-12-24 – 2021-12-25 (×2): 5 mL via ORAL
  Filled 2021-12-24 (×2): qty 5

## 2021-12-24 MED ORDER — LEVALBUTEROL HCL 1.25 MG/0.5ML IN NEBU
1.2500 mg | INHALATION_SOLUTION | Freq: Four times a day (QID) | RESPIRATORY_TRACT | Status: DC
  Administered 2021-12-24 – 2021-12-25 (×4): 1.25 mg via RESPIRATORY_TRACT
  Filled 2021-12-24 (×5): qty 0.5

## 2021-12-24 MED ORDER — DASATINIB 70 MG PO TABS
70.0000 mg | ORAL_TABLET | Freq: Every day | ORAL | Status: DC
  Administered 2021-12-25 – 2021-12-27 (×3): 70 mg via ORAL
  Filled 2021-12-24 (×6): qty 1

## 2021-12-24 MED ORDER — IBUPROFEN 400 MG PO TABS
400.0000 mg | ORAL_TABLET | Freq: Four times a day (QID) | ORAL | Status: DC | PRN
  Administered 2021-12-24 – 2021-12-25 (×2): 400 mg via ORAL
  Filled 2021-12-24 (×2): qty 1

## 2021-12-24 MED ORDER — IPRATROPIUM-ALBUTEROL 0.5-2.5 (3) MG/3ML IN SOLN
3.0000 mL | Freq: Three times a day (TID) | RESPIRATORY_TRACT | Status: DC
  Filled 2021-12-24: qty 3

## 2021-12-24 NOTE — Progress Notes (Signed)
Pharmacy Antibiotic Note  Kathryn Black is a 46 y.o. female admitted on 12/22/2021 with pneumonia.  Pharmacy has been consulted for Cefepime  dosing. Pt is immunocompromised. Monitor fever curve.   Plan: Continue cefepime 2 g q8H.   Vancomycin d/c'ed as MRSA PCR was negative.   Height: '5\' 7"'$  (170.2 cm) Weight: 82.5 kg (181 lb 14.1 oz) IBW/kg (Calculated) : 61.6  Temp (24hrs), Avg:100.1 F (37.8 C), Min:98.8 F (37.1 C), Max:102.9 F (39.4 C)  Recent Labs  Lab 12/22/21 2222 12/23/21 0530 12/24/21 0445  WBC 0.5* 0.6* 2.0*  CREATININE 0.74 0.54 0.61  LATICACIDVEN 1.1  --   --      Estimated Creatinine Clearance: 98.1 mL/min (by C-G formula based on SCr of 0.61 mg/dL).    Allergies  Allergen Reactions   Other     Pt cant take any pain reliever or fever reducer due to condition.   Sulfa Antibiotics Anaphylaxis   Erythromycin Hives   Zithromax [Azithromycin]     Microbiology results: 5/24 BCx: pending 5/24 UCx: pending  5/25 MRSA PCR: negative   Thank you for allowing pharmacy to be a part of this patient's care.  Oswald Hillock, PharmD, BCPS 12/24/2021 9:26 AM

## 2021-12-24 NOTE — Consult Note (Signed)
[place]  [phone] [phone]  ID: Kathryn Black OB: 05-Apr-1976  MR#: [id number]  AOZ#:[id number]  Patient Care Team: Jon Billings, NP as PCP - General (Nurse Practitioner)  CHIEF COMPLAINT: Ph+ ALL now with neutropenic fever and left lower lobe pneumonia.  INTERVAL HISTORY: Patient is a 46 year old female who recently received her second treatment of HyperCVAD at [facility] approximately 10 days ago.  Over the past week, she developed fever and cough.  She otherwise has felt well.  She has otherwise tolerating her treatments well.  She has no neurologic complaints.  She has good appetite and denies weight loss.  She denies any chest pain, shortness of breath, or hemoptysis.  She denies any nausea, vomiting, constipation, or diarrhea.  She has no urinary complaints.  Patient offers no further specific complaints today.    REVIEW OF SYSTEMS:   Review of Systems  Constitutional:  Positive for fever and malaise/fatigue. Negative for weight loss.  Respiratory:  Positive for cough. Negative for hemoptysis and wheezing.   Cardiovascular: Negative.  Negative for chest pain and leg swelling.  Gastrointestinal: Negative.  Negative for abdominal pain.  Genitourinary: Negative.  Negative for dysuria.  Musculoskeletal: Negative.  Negative for back pain.  Skin: Negative.  Negative for rash.  Neurological:  Positive for weakness. Negative for dizziness, focal weakness and headaches.  Psychiatric/Behavioral: Negative.  The patient is not nervous/anxious.    As per HPI. Otherwise, a complete review of systems is negative.  PAST MEDICAL HISTORY: Past Medical History:  Diagnosis Date   Anxiety    no meds   Asthma    childhood-allergy induced   Depression    no meds   Headache(784.0)    Hypertension    Kidney stone     PAST SURGICAL HISTORY: Past Surgical History:  Procedure Laterality Date   BREAST BIOPSY  04/01/1989   in office procedure under  local   CYSTOSCOPY WITH URETEROSCOPY  11/23/2011   Procedure: CYSTOSCOPY WITH URETEROSCOPY;  Surgeon: Franchot Gallo, MD;  Location: [facility];  Service: Urology;  Laterality: Right;  WITH STONE obtained   KIDNEY STONE SURGERY     LEEP     in office procedure    FAMILY HISTORY: Family History  Problem Relation Age of Onset   Depression Sister    Hashimoto's thyroiditis Sister     ADVANCED DIRECTIVES (Y/N):  '@ADVDIR'$ @  HEALTH MAINTENANCE: Social History   Tobacco Use   Smoking status: Former    Packs/day: 0.25    Types: Cigarettes   Smokeless tobacco: Never  Vaping Use   Vaping Use: Former  Substance Use Topics   Alcohol use: Yes    Comment: occ   Drug use: No     Colonoscopy:  PAP:  Bone density:  Lipid panel:  Allergies  Allergen Reactions   Other     Pt cant take any pain reliever or fever reducer due to condition.   Sulfa Antibiotics Anaphylaxis   Erythromycin Hives   Zithromax [Azithromycin]     Current Facility-Administered Medications  Medication Dose Route Frequency Provider Last Rate Last Admin   benzonatate (TESSALON) capsule 100 mg  100 mg Oral TID PRN Athena Masse, MD   100 mg at 12/24/21 0906   ceFEPIme (MAXIPIME) 2 g in sodium chloride 0.9 % 100 mL IVPB  2 g Intravenous Q8H Judd Gaudier V, MD 200 mL/hr at 12/24/21 1416 2 g at 12/24/21 1416   Chlorhexidine Gluconate Cloth 2 % PADS  6 each  6 each Topical Daily Enzo Bi, MD   6 each at 12/24/21 0906   dasatinib (SPRYCEL) tablet 70 mg  70 mg Oral Daily Enzo Bi, MD       dextromethorphan (DELSYM) 30 MG/5ML liquid 30 mg  30 mg Oral TID PRN Enzo Bi, MD   30 mg at 12/24/21 0943   escitalopram (LEXAPRO) tablet 10 mg  10 mg Oral Daily Oswald Hillock, RPH   10 mg at 12/24/21 1415   fluconazole (DIFLUCAN) tablet 200 mg  200 mg Oral Daily Judd Gaudier V, MD   200 mg at 12/24/21 1415   HYDROcodone bit-homatropine (HYCODAN) 5-1.5 MG/5ML syrup 5 mL  5 mL Oral Q4H PRN Enzo Bi, MD        ibuprofen (ADVIL) tablet 400 mg  400 mg Oral Q6H PRN Enzo Bi, MD   400 mg at 12/24/21 1415   levalbuterol (XOPENEX) nebulizer solution 1.25 mg  1.25 mg Nebulization Q6H Enzo Bi, MD   1.25 mg at 12/24/21 1451   loratadine (CLARITIN) tablet 10 mg  10 mg Oral Daily Judd Gaudier V, MD   10 mg at 12/24/21 0906   ondansetron (ZOFRAN) tablet 4 mg  4 mg Oral Q6H PRN Athena Masse, MD       Or   ondansetron [facility]) injection 4 mg  4 mg Intravenous Q6H PRN Athena Masse, MD       valACYclovir (VALTREX) tablet 500 mg  500 mg Oral Daily Judd Gaudier V, MD   500 mg at 12/24/21 0906    OBJECTIVE: Vitals:   12/24/21 1027 12/24/21 1300  BP:    Pulse: 98   Resp:    Temp:  (!) 101 F (38.3 C)  SpO2:       Body mass index is 28.49 kg/m.    ECOG FS:1 - Symptomatic but completely ambulatory  General: Well-developed, well-nourished, no acute distress. Eyes: Pink conjunctiva, anicteric sclera. HEENT: Normocephalic, moist mucous membranes. Lungs: No audible wheezing or coughing. Heart: Regular rate and rhythm. Abdomen: Soft, nontender, no obvious distention. Musculoskeletal: No edema, cyanosis, or clubbing. Neuro: Alert, answering all questions appropriately. Cranial nerves grossly intact. Skin: No rashes or petechiae noted. Psych: Normal affect. Lymphatics: No cervical, calvicular, axillary or inguinal LAD.   LAB RESULTS:  Lab Results  Component Value Date   NA 136 12/24/2021   K 3.5 12/24/2021   CL 109 12/24/2021   CO2 23 12/24/2021   GLUCOSE 98 12/24/2021   BUN 17 12/24/2021   CREATININE 0.61 12/24/2021   CALCIUM 8.7 (L) 12/24/2021   PROT 7.5 12/22/2021   ALBUMIN 3.9 12/22/2021   AST 16 12/22/2021   ALT 28 12/22/2021   ALKPHOS 52 12/22/2021   BILITOT 0.8 12/22/2021   GFRNONAA >60 12/24/2021   GFRAA >60 11/04/2019    Lab Results  Component Value Date   WBC 2.0 (L) 12/24/2021   NEUTROABS 0.5 (L) 12/24/2021   HGB 7.9 (L) 12/24/2021   HCT 23.7 (L) 12/24/2021   MCV  87.1 12/24/2021   PLT 50 (L) 12/24/2021     STUDIES: CT Angio Chest PE W and/or Wo Contrast  Result Date: 12/22/2021 CLINICAL DATA:  Sepsis, leukemia, fever EXAM: CT ANGIOGRAPHY CHEST CT ABDOMEN AND PELVIS WITH CONTRAST TECHNIQUE: Multidetector CT imaging of the chest was performed using the standard protocol during bolus administration of intravenous contrast. Multiplanar CT image reconstructions and MIPs were obtained to evaluate the vascular anatomy. Multidetector CT imaging of the abdomen and pelvis  was performed using the standard protocol during bolus administration of intravenous contrast. RADIATION DOSE REDUCTION: This exam was performed according to the departmental dose-optimization program which includes automated exposure control, adjustment of the mA and/or kV according to patient size and/or use of iterative reconstruction technique. CONTRAST:  184m OMNIPAQUE IOHEXOL 350 MG/ML SOLN COMPARISON:  CT abdomen pelvis 01/21/2016 FINDINGS: CTA CHEST FINDINGS Cardiovascular: No significant coronary artery calcification. Global cardiac size within normal limits. No pericardial effusion. Central pulmonary arteries are normal caliber. No intraluminal filling defect identified through the segmental level to suggest acute pulmonary embolism. Thoracic aorta is unremarkable. Right internal jugular chest port tip is seen within the superior right atrium. Mediastinum/Nodes: Visualized thyroid is unremarkable. No pathologic thoracic adenopathy. Esophagus unremarkable. Small hiatal hernia. Lungs/Pleura: There is left lower lobe consolidation, likely infectious in the acute setting. The lungs are otherwise clear. No pneumothorax or pleural effusion. Central airways are widely patent. Musculoskeletal: No acute bone abnormality. No lytic or blastic bone lesion. Review of the MIP images confirms the above findings. CT ABDOMEN and PELVIS FINDINGS Hepatobiliary: No focal liver abnormality is seen. No gallstones,  gallbladder wall thickening, or biliary dilatation. Pancreas: Unremarkable Spleen: Normal in size without focal abnormality. Adrenals/Urinary Tract: The adrenal glands are unremarkable. The kidneys are normal in size and position. There are punctate 1-2 mm nonobstructing calculi seen scattered throughout the kidneys bilaterally. Mild right hydronephrosis is present, however, no ureteral calculi are identified. This may reflect the residua of a recently passed calculus or an acute inflammatory process. Left kidney is unremarkable. No enhancing intrarenal masses. No perinephric fluid collections. The bladder is unremarkable. Stomach/Bowel: Stomach is within normal limits. Appendix appears normal. No evidence of bowel wall thickening, distention, or inflammatory changes. Vascular/Lymphatic: No significant vascular findings are present. No enlarged abdominal or pelvic lymph nodes. Reproductive: Uterus and bilateral adnexa are unremarkable. Other: No abdominal wall hernia.  Rectum unremarkable. Musculoskeletal: No acute bone abnormality. No lytic or blastic bone lesion. Review of the MIP images confirms the above findings. IMPRESSION: No pulmonary embolism. Left lower lobe consolidation in keeping with acute lobar pneumonia in the appropriate clinical setting. Small hiatal hernia. Mild bilateral nonobstructing nephrolithiasis. Mild right hydronephrosis. No definite obstructing lesion or calculus identified. This may relate to a recently passed calculus or reflect a superimposed inflammatory process such as pyelonephritis. Correlation with urinalysis and urine culture may be helpful. Electronically Signed   By: AFidela SalisburyM.D.   On: 12/22/2021 23:59   CT ABDOMEN PELVIS W CONTRAST  Result Date: 12/22/2021 CLINICAL DATA:  Sepsis, leukemia, fever EXAM: CT ANGIOGRAPHY CHEST CT ABDOMEN AND PELVIS WITH CONTRAST TECHNIQUE: Multidetector CT imaging of the chest was performed using the standard protocol during bolus  administration of intravenous contrast. Multiplanar CT image reconstructions and MIPs were obtained to evaluate the vascular anatomy. Multidetector CT imaging of the abdomen and pelvis was performed using the standard protocol during bolus administration of intravenous contrast. RADIATION DOSE REDUCTION: This exam was performed according to the departmental dose-optimization program which includes automated exposure control, adjustment of the mA and/or kV according to patient size and/or use of iterative reconstruction technique. CONTRAST:  1035mOMNIPAQUE IOHEXOL 350 MG/ML SOLN COMPARISON:  CT abdomen pelvis 01/21/2016 FINDINGS: CTA CHEST FINDINGS Cardiovascular: No significant coronary artery calcification. Global cardiac size within normal limits. No pericardial effusion. Central pulmonary arteries are normal caliber. No intraluminal filling defect identified through the segmental level to suggest acute pulmonary embolism. Thoracic aorta is unremarkable. Right internal jugular chest port tip is  seen within the superior right atrium. Mediastinum/Nodes: Visualized thyroid is unremarkable. No pathologic thoracic adenopathy. Esophagus unremarkable. Small hiatal hernia. Lungs/Pleura: There is left lower lobe consolidation, likely infectious in the acute setting. The lungs are otherwise clear. No pneumothorax or pleural effusion. Central airways are widely patent. Musculoskeletal: No acute bone abnormality. No lytic or blastic bone lesion. Review of the MIP images confirms the above findings. CT ABDOMEN and PELVIS FINDINGS Hepatobiliary: No focal liver abnormality is seen. No gallstones, gallbladder wall thickening, or biliary dilatation. Pancreas: Unremarkable Spleen: Normal in size without focal abnormality. Adrenals/Urinary Tract: The adrenal glands are unremarkable. The kidneys are normal in size and position. There are punctate 1-2 mm nonobstructing calculi seen scattered throughout the kidneys bilaterally. Mild  right hydronephrosis is present, however, no ureteral calculi are identified. This may reflect the residua of a recently passed calculus or an acute inflammatory process. Left kidney is unremarkable. No enhancing intrarenal masses. No perinephric fluid collections. The bladder is unremarkable. Stomach/Bowel: Stomach is within normal limits. Appendix appears normal. No evidence of bowel wall thickening, distention, or inflammatory changes. Vascular/Lymphatic: No significant vascular findings are present. No enlarged abdominal or pelvic lymph nodes. Reproductive: Uterus and bilateral adnexa are unremarkable. Other: No abdominal wall hernia.  Rectum unremarkable. Musculoskeletal: No acute bone abnormality. No lytic or blastic bone lesion. Review of the MIP images confirms the above findings. IMPRESSION: No pulmonary embolism. Left lower lobe consolidation in keeping with acute lobar pneumonia in the appropriate clinical setting. Small hiatal hernia. Mild bilateral nonobstructing nephrolithiasis. Mild right hydronephrosis. No definite obstructing lesion or calculus identified. This may relate to a recently passed calculus or reflect a superimposed inflammatory process such as pyelonephritis. Correlation with urinalysis and urine culture may be helpful. Electronically Signed   By: Fidela Salisbury M.D.   On: 12/22/2021 23:59   CT L-SPINE NO CHARGE  Result Date: 12/23/2021 CLINICAL DATA:  Fever, recently leukemia diagnosis EXAM: CT LUMBAR SPINE WITHOUT CONTRAST TECHNIQUE: Multidetector CT imaging of the lumbar spine was performed without intravenous contrast administration. Multiplanar CT image reconstructions were also generated. RADIATION DOSE REDUCTION: This exam was performed according to the departmental dose-optimization program which includes automated exposure control, adjustment of the mA and/or kV according to patient size and/or use of iterative reconstruction technique. COMPARISON:  No dedicated CT of the  lumbar spine correlation is made with 01/21/2016 CT abdomen pelvis FINDINGS: Segmentation: 5 lumbar type vertebrae. Alignment: No listhesis.  Minimal levocurvature Vertebrae: No acute fracture or suspicious osseous lesion. No endplate erosive changes to suggest discitis osteomyelitis. Paraspinal and other soft tissues: Please see same-day CT abdomen pelvis for soft tissue findings. Disc levels: L4-L5: Mild disc bulge with left subarticular and foraminal protrusion, which narrows the left lateral recess. No spinal canal stenosis or neural foraminal narrowing. L5-S1: Broad-based disc bulge. No spinal canal stenosis. Mild left neural foraminal narrowing. IMPRESSION: 1. No acute osseous abnormality or listhesis in the lumbar spine. 2. L5-S1 mild left neural foraminal narrowing. 3. L4-L5 narrowing of the left lateral recess, which could affect the descending left L5 nerve roots. Electronically Signed   By: Merilyn Baba M.D.   On: 12/23/2021 00:13   DG Chest Port 1 View  Result Date: 12/22/2021 CLINICAL DATA:  Sepsis EXAM: PORTABLE CHEST 1 VIEW COMPARISON:  None Available. FINDINGS: The heart size and mediastinal contours are within normal limits. Right internal jugular dual chamber chest port is seen with its tip within the superior vena cava, new since prior examination. Both lungs are clear.  The visualized skeletal structures are unremarkable. IMPRESSION: No radiographic evidence of acute cardiopulmonary disease. Electronically Signed   By: Fidela Salisbury M.D.   On: 12/22/2021 22:25    ASSESSMENT: Ph+ ALL now with neutropenic fever and left lower lobe pneumonia.  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  Her last chemotherapy was approximately 10 days ago.  Continue Sprycel as prescribed.  Patient has her own medication from home.  Patient reports her next treatment is scheduled for 1 week, but this may be delayed if her blood counts do not recover.  She has been instructed to  keep this appointment as scheduled. Neutropenic fever: Patient's last recorded fever earlier this afternoon was at 101.  Continue current antibiotics as prescribed.  Total white blood cell count has improved to 2.0 with an [place] of 500.  Patient has been instructed to reinitiate her prophylactic medications upon discharge. Anemia: Patient's hemoglobin is decreased to 7.9, likely secondary to chemotherapy and underlying ALL.  Transfuse if hemoglobin falls below 7.0.  All blood products must be irradiated. Thrombocytopenia: Platelet count has improved to 50.  No transfusion needed.  As above, all blood products must be irradiated. Left lower lobe pneumonia: Continue antibiotics as above.  Appreciate consult, will follow.   Lloyd Huger, MD   12/24/2021 2:53 PM

## 2021-12-24 NOTE — Progress Notes (Signed)
  Progress Note   Patient: Kathryn Black QMV:[id number] DOB: 1976-03-31 DOA: 12/22/2021     1 DOS: the patient was seen and examined on 12/24/2021   Brief hospital course: No notes on file  Assessment and Plan: * LLL pneumonia Cough x2 weeks with shortness of breath and fever and chest CT showing left lower lobe consolidation.  Not hypoxic. --started on cefepime and vancomycin, but vanc d/c'ed after MRSA screen neg Plan: --cont cefepime --Antitussives PRN  Sepsis ([place]) Sepsis criteria includes fever, tachycardia and tachypnea Received IV fluid bolus in the ED Continue sepsis fluids per protocol and treat pneumonia as above  Hydronephrosis, right "CT abdomen showing mild bilateral nonobstructing nephrolithiasis.  Mild right hydronephrosis. No definite obstructing lesion or calculus identified. This may relate to a recently passed calculus or reflect a superimposed inflammatory process such as pyelonephritis. Correlation with urinalysis and urine culture may be Helpful". 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 Plan: Continue prophylaxis with Valtrex and fluconazole.   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 She was started on Lidocaine patches and Voltaren gel during recent [facility] admission. Reported she is no longer using Oxycodone PRN. She has follow up with pain clinic on 6/1          Subjective:  Pt reported still a lot of coughing.  Also still having intermittent fevers.   Physical Exam:  Constitutional: NAD, AAOx3 HEENT: conjunctivae and lids normal, EOMI CV: No cyanosis.   RESP: normal respiratory effort, on RA Neuro: II - XII grossly intact.   Psych: Normal mood and affect.  Appropriate judgement and reason   Data Reviewed:  Family Communication:   Disposition: Status is: Inpatient   Planned Discharge Destination: Home    Time spent: 50 minutes  Author: Enzo Bi, MD 12/24/2021 1:07 PM  For on call review [email or web address].

## 2021-12-25 DIAGNOSIS — J40 Bronchitis, not specified as acute or chronic: Secondary | ICD-10-CM

## 2021-12-25 DIAGNOSIS — D709 Neutropenia, unspecified: Secondary | ICD-10-CM | POA: Diagnosis present

## 2021-12-25 DIAGNOSIS — J189 Pneumonia, unspecified organism: Secondary | ICD-10-CM | POA: Diagnosis not present

## 2021-12-25 LAB — CBC WITH DIFFERENTIAL/PLATELET
Abs Immature Granulocytes: 2.1 10*3/uL — ABNORMAL HIGH (ref 0.00–0.07)
Basophils Absolute: 0 10*3/uL (ref 0.0–0.1)
Basophils Relative: 0 %
Eosinophils Absolute: 0.1 10*3/uL (ref 0.0–0.5)
Eosinophils Relative: 1 %
HCT: 24.1 % — ABNORMAL LOW (ref 36.0–46.0)
Hemoglobin: 8 g/dL — ABNORMAL LOW (ref 12.0–15.0)
Immature Granulocytes: 17 %
Lymphocytes Relative: 8 %
Lymphs Abs: 0.9 10*3/uL (ref 0.7–4.0)
MCH: 28.8 pg (ref 26.0–34.0)
MCHC: 33.2 g/dL (ref 30.0–36.0)
MCV: 86.7 fL (ref 80.0–100.0)
Monocytes Absolute: 3.3 10*3/uL — ABNORMAL HIGH (ref 0.1–1.0)
Monocytes Relative: 27 %
Neutro Abs: 5.9 10*3/uL (ref 1.7–7.7)
Neutrophils Relative %: 47 %
Platelets: 138 10*3/uL — ABNORMAL LOW (ref 150–400)
RBC: 2.78 MIL/uL — ABNORMAL LOW (ref 3.87–5.11)
RDW: 17.5 % — ABNORMAL HIGH (ref 11.5–15.5)
Smear Review: NORMAL
WBC: 12.3 10*3/uL — ABNORMAL HIGH (ref 4.0–10.5)
nRBC: 0.2 % (ref 0.0–0.2)

## 2021-12-25 LAB — BASIC METABOLIC PANEL
Anion gap: 7 (ref 5–15)
BUN: 22 mg/dL — ABNORMAL HIGH (ref 6–20)
CO2: 24 mmol/L (ref 22–32)
Calcium: 8.9 mg/dL (ref 8.9–10.3)
Chloride: 109 mmol/L (ref 98–111)
Creatinine, Ser: 0.72 mg/dL (ref 0.44–1.00)
GFR, Estimated: >60 mL/min (ref >60)
Glucose, Bld: 118 mg/dL — ABNORMAL HIGH (ref 70–99)
Potassium: 3.7 mmol/L (ref 3.5–5.1)
Sodium: 140 mmol/L (ref 135–145)

## 2021-12-25 LAB — MAGNESIUM: Magnesium: 2 mg/dL (ref 1.7–2.4)

## 2021-12-25 MED ORDER — PREDNISONE 20 MG PO TABS
20.0000 mg | ORAL_TABLET | Freq: Every day | ORAL | Status: DC
  Administered 2021-12-25 – 2021-12-27 (×3): 20 mg via ORAL
  Filled 2021-12-25 (×3): qty 1

## 2021-12-25 MED ORDER — LEVALBUTEROL HCL 1.25 MG/0.5ML IN NEBU
1.2500 mg | INHALATION_SOLUTION | Freq: Two times a day (BID) | RESPIRATORY_TRACT | Status: DC
  Filled 2021-12-25: qty 0.5

## 2021-12-25 MED ORDER — LEVALBUTEROL HCL 1.25 MG/0.5ML IN NEBU
1.2500 mg | INHALATION_SOLUTION | Freq: Four times a day (QID) | RESPIRATORY_TRACT | Status: DC
  Administered 2021-12-25 – 2021-12-26 (×3): 1.25 mg via RESPIRATORY_TRACT
  Filled 2021-12-25 (×4): qty 0.5

## 2021-12-25 NOTE — Assessment & Plan Note (Signed)
--  severe cough with likely bronchospasm.  Improved with steroid Plan: --cont scheduled xopenex neb  --Hycodan, Tessalon, Delsym PRN --cont prednisone 20 mg daily

## 2021-12-25 NOTE — Progress Notes (Signed)
  Progress Note   Patient: Kathryn Black:[id number] DOB: April 09, 1976 DOA: 12/22/2021     2 DOS: the patient was seen and examined on 12/25/2021   Brief hospital course: No notes on file  Assessment and Plan: * LLL pneumonia Cough x2 weeks with shortness of breath and fever and chest CT showing left lower lobe consolidation.  Not hypoxic. --started on cefepime and vancomycin, but vanc d/c'ed after MRSA screen neg Plan: --cont cefepime for 5 days  Sepsis ([place]) Sepsis criteria includes fever, tachycardia and tachypnea Received IV fluid bolus in the ED Continue sepsis fluids per protocol and treat pneumonia as above  Hydronephrosis, right "CT abdomen showing mild bilateral nonobstructing nephrolithiasis.  Mild right hydronephrosis. No definite obstructing lesion or calculus identified. This may relate to a recently passed calculus or reflect a superimposed inflammatory process such as pyelonephritis. Correlation with urinalysis and urine culture may be Helpful". 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 Plan: Continue prophylaxis with Valtrex and fluconazole.   --hold Levaquin since patient is on cefepime --Consulted oncology, Dr. Grayland Ormond following  Pancytopenia [facility]) --due to chemo Plan: --monitor cell counts --cont fluconazole and valacyclovir   Neutropenia with fever ([place]) --ANC count recovered. 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 She was started on Lidocaine patches and Voltaren gel during recent [facility] admission. Reported she is no longer using Oxycodone PRN. She has follow up with pain clinic on 6/1         Subjective:  Pt reported breathing tx helped with cough, but still having coughing fit that caused her to vomit.  Pt started on prednisone for bronchospasm (approved by Dr. Grayland Ormond).   Physical Exam:  Constitutional: NAD, AAOx3 HEENT: conjunctivae and lids normal, EOMI CV: No cyanosis.   RESP: normal respiratory effort, frequent coughs, on RA Neuro: II - XII grossly intact.   Psych: Normal mood and affect.  Appropriate judgement and reason   Data Reviewed:  Family Communication:   Disposition: Status is: Inpatient   Planned Discharge Destination: Home    Time spent: 50 minutes  Author: Enzo Bi, MD 12/25/2021 6:59 PM  For on call review [email or web address].

## 2021-12-25 NOTE — Progress Notes (Signed)
Patient remained afebrile throughout shift.

## 2021-12-25 NOTE — Assessment & Plan Note (Signed)
--  ANC count recovered. --monitor for fever --cont cefepime

## 2021-12-26 DIAGNOSIS — J189 Pneumonia, unspecified organism: Secondary | ICD-10-CM | POA: Diagnosis not present

## 2021-12-26 LAB — BASIC METABOLIC PANEL
Anion gap: 7 (ref 5–15)
BUN: 17 mg/dL (ref 6–20)
CO2: 25 mmol/L (ref 22–32)
Calcium: 9 mg/dL (ref 8.9–10.3)
Chloride: 108 mmol/L (ref 98–111)
Creatinine, Ser: 0.59 mg/dL (ref 0.44–1.00)
GFR, Estimated: >60 mL/min (ref >60)
Glucose, Bld: 138 mg/dL — ABNORMAL HIGH (ref 70–99)
Potassium: 4.4 mmol/L (ref 3.5–5.1)
Sodium: 140 mmol/L (ref 135–145)

## 2021-12-26 LAB — CBC
HCT: 23.5 % — ABNORMAL LOW (ref 36.0–46.0)
Hemoglobin: 7.6 g/dL — ABNORMAL LOW (ref 12.0–15.0)
MCH: 28.5 pg (ref 26.0–34.0)
MCHC: 32.3 g/dL (ref 30.0–36.0)
MCV: 88 fL (ref 80.0–100.0)
Platelets: 173 10*3/uL (ref 150–400)
RBC: 2.67 MIL/uL — ABNORMAL LOW (ref 3.87–5.11)
RDW: 18.6 % — ABNORMAL HIGH (ref 11.5–15.5)
WBC: 32.7 10*3/uL — ABNORMAL HIGH (ref 4.0–10.5)
nRBC: 0.1 % (ref 0.0–0.2)

## 2021-12-26 LAB — MAGNESIUM: Magnesium: 2.3 mg/dL (ref 1.7–2.4)

## 2021-12-26 MED ORDER — LEVALBUTEROL HCL 1.25 MG/0.5ML IN NEBU
1.2500 mg | INHALATION_SOLUTION | Freq: Three times a day (TID) | RESPIRATORY_TRACT | Status: DC
  Administered 2021-12-26 – 2021-12-27 (×3): 1.25 mg via RESPIRATORY_TRACT
  Filled 2021-12-26 (×3): qty 0.5

## 2021-12-26 NOTE — Progress Notes (Signed)
  Progress Note   Patient: Kathryn Black WGN:[id number] DOB: 09-18-1975 DOA: 12/22/2021     3 DOS: the patient was seen and examined on 12/26/2021   Brief hospital course: No notes on file  Assessment and Plan: * LLL pneumonia Cough x2 weeks with shortness of breath and fever and chest CT showing left lower lobe consolidation.  Not hypoxic. --started on cefepime and vancomycin, but vanc d/c'ed after MRSA screen neg Plan: --cont cefepime for 5 days  Sepsis ([place]) Sepsis criteria includes fever, tachycardia and tachypnea Received IV fluid bolus in the ED Continue sepsis fluids per protocol and treat pneumonia as above  Hydronephrosis, right "CT abdomen showing mild bilateral nonobstructing nephrolithiasis.  Mild right hydronephrosis. No definite obstructing lesion or calculus identified. This may relate to a recently passed calculus or reflect a superimposed inflammatory process such as pyelonephritis. Correlation with urinalysis and urine culture may be Helpful". 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 Plan: Continue prophylaxis with Valtrex and fluconazole.   --hold Levaquin since patient is on cefepime --Consulted oncology, Dr. Grayland Ormond following  Pancytopenia [facility]) --due to chemo Plan: --monitor cell counts --cont fluconazole and valacyclovir   Neutropenia with fever ([place]) --ANC count recovered.  But still having fevers. Plan: --monitor for fever --cont cefepime --ID consult tomorrow  Bronchitis --severe cough with likely bronchospasm.  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 She was started on Lidocaine patches and Voltaren gel during recent [facility] admission. Reported she is no longer using Oxycodone PRN. She has follow up with pain clinic on 6/1         Subjective:  Had another fever to 100.8 yesterday. Pt reported spastic cough improved.      Physical Exam:  Constitutional: NAD, AAOx3 HEENT: conjunctivae and lids normal, EOMI CV: No cyanosis.   RESP: normal respiratory effort, on RA Neuro: II - XII grossly intact.   Psych: Normal mood and affect.  Appropriate judgement and reason    Data Reviewed:  Family Communication: husband updated at bedside today  Disposition: Status is: Inpatient   Planned Discharge Destination: Home    Time spent: 35 minutes  Author: Enzo Bi, MD 12/26/2021 6:00 PM  For on call review [email or web address].

## 2021-12-27 DIAGNOSIS — J189 Pneumonia, unspecified organism: Secondary | ICD-10-CM | POA: Diagnosis not present

## 2021-12-27 LAB — CULTURE, BLOOD (ROUTINE X 2)
Culture: NO GROWTH
Culture: NO GROWTH
Special Requests: ADEQUATE

## 2021-12-27 LAB — CBC
HCT: 22.8 % — ABNORMAL LOW (ref 36.0–46.0)
Hemoglobin: 7.3 g/dL — ABNORMAL LOW (ref 12.0–15.0)
MCH: 28.5 pg (ref 26.0–34.0)
MCHC: 32 g/dL (ref 30.0–36.0)
MCV: 89.1 fL (ref 80.0–100.0)
Platelets: 207 10*3/uL (ref 150–400)
RBC: 2.56 MIL/uL — ABNORMAL LOW (ref 3.87–5.11)
RDW: 19.3 % — ABNORMAL HIGH (ref 11.5–15.5)
WBC: 49.9 10*3/uL — ABNORMAL HIGH (ref 4.0–10.5)
nRBC: 0.4 % — ABNORMAL HIGH (ref 0.0–0.2)

## 2021-12-27 LAB — BASIC METABOLIC PANEL
Anion gap: 9 (ref 5–15)
BUN: 21 mg/dL — ABNORMAL HIGH (ref 6–20)
CO2: 26 mmol/L (ref 22–32)
Calcium: 8.6 mg/dL — ABNORMAL LOW (ref 8.9–10.3)
Chloride: 106 mmol/L (ref 98–111)
Creatinine, Ser: 0.55 mg/dL (ref 0.44–1.00)
GFR, Estimated: >60 mL/min (ref >60)
Glucose, Bld: 99 mg/dL (ref 70–99)
Potassium: 3.6 mmol/L (ref 3.5–5.1)
Sodium: 141 mmol/L (ref 135–145)

## 2021-12-27 LAB — MAGNESIUM: Magnesium: 2.3 mg/dL (ref 1.7–2.4)

## 2021-12-27 MED ORDER — LEVALBUTEROL HCL 1.25 MG/0.5ML IN NEBU: 1.2500 mg | INHALATION_SOLUTION | Freq: Two times a day (BID) | RESPIRATORY_TRACT | Status: DC

## 2021-12-27 MED ORDER — HEPARIN SOD (PORK) LOCK FLUSH 100 UNIT/ML IV SOLN
500.0000 [IU] | Freq: Once | INTRAVENOUS | Status: AC
  Administered 2021-12-27: 500 [IU] via INTRAVENOUS
  Filled 2021-12-27: qty 5

## 2021-12-27 NOTE — Progress Notes (Signed)
Attempted to draw morning lab work from patient's port, but unable to obtain blood from port. Charge RN, Museum/gallery conservator, attempted as well with no success. IV team consult placed for suggestions. Port is flushing with moderate resistance, but no blood return.

## 2021-12-27 NOTE — Discharge Summary (Incomplete)
Physician Discharge Summary   Kathryn Black  female DOB: 01-20-1976  ASN:[id number]  PCP: Jon Billings, NP  Admit date: 12/22/2021 Discharge date: 12/27/2021  Admitted From: home Disposition:  home CODE STATUS: Full code  Discharge Instructions     Diet general   Complete by: As directed       Hospital Course:  For full details, please see H&P, progress notes, consult notes and ancillary notes.  Briefly,  Kathryn Black is a 46 y.o. 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    Patient states her cough was ongoing while at [facility].  Review of the discharge summary reveals that an x-ray was done on 5/13 and was clear without evidence of infection and she was treated with Zyrtec and Levaquin.  She had previously completed a course of Augmentin from 5/8 to 5/15.  * LLL pneumonia Initial CXR again showed no acute finding, but since chest CT showing left lower lobe consolidation, pt was treated for PNA and completed a course with 5 days of cefepime.  Bronchitis --severe cough with likely bronchospasm.  Improved with prednisone 20 mg daily x 3 days and scheduled xopenex neb.  Neutropenia with fever ([place]) --ANC was 0 on presentation, recovered to 5.9 by 5/27, however, had another fever to 100.8 afternoon of 5/27.  Did not have another obvious source of infection.  ID consult was planned, however, pt felt ready to go home on 5/29, and was clear by oncology for discharge, so pt was discharged with f/u with outpatient oncology at [facility].  Leukocytosis --WBC 0.5 on presentation, recovered to 12.3 on 5/27, then up to 32.7 and 49.9 for the next 2 days.  Could be related to steroid use, but also could  be due to her leukemia.  Pt has outpatient oncology f/u with [facility] the day after discharge.  Sepsis ([place]) Sepsis criteria includes fever, tachycardia and tachypnea Received IV fluid bolus in the ED treated pneumonia as above   Hydronephrosis, right "CT abdomen showing mild bilateral nonobstructing nephrolithiasis.  Mild right hydronephrosis. No definite obstructing lesion or calculus identified. This may relate to a recently passed calculus or reflect a superimposed inflammatory process such as pyelonephritis. --UA not interpretable due to color interference.  No urinary symptom.  Pt received empiric abx as above.   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 --Oncology consulted and following while inpatient.   --discharged on home regimen.   Pancytopenia ([place]) --due to chemo --cont fluconazole and valacyclovir for ppx   Chronic back pain Followed by [facility]. She was started on Lidocaine patches and Voltaren gel during recent [facility] admission. Reported she is no longer using Oxycodone PRN. She has follow up with pain clinic on 6/1  Medications under "STOP" are ones pt was not taking PTA.   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 The score is based on dignosis, age, lab data, medications, orders, and past utilization.   Low:  0-14.9   Medium: 15-21.9   High: 22-29.9   Extreme: 30 and above         Discharge Instructions:  Allergies as of 12/27/2021       Reactions   Other    Pt cant take any pain reliever or fever reducer due to condition.   Sulfa Antibiotics Anaphylaxis   Erythromycin Hives   Zithromax [azithromycin]         Medication List     STOP taking these medications    acetaminophen 650 MG CR tablet Commonly known as: TYLENOL   butalbital-acetaminophen-caffeine 50-325-40 MG tablet Commonly known as: FIORICET   dexamethasone 4 MG tablet Commonly known as: DECADRON   LORazepam 0.5 MG tablet Commonly known as: ATIVAN   oxyCODONE 5 MG immediate release tablet Commonly known as: Oxy IR/ROXICODONE   prednisoLONE acetate 1 % ophthalmic suspension Commonly known as: PRED FORTE   prochlorperazine 10 MG tablet Commonly known as: COMPAZINE       TAKE these medications    albuterol 108 (90 Base) MCG/ACT inhaler Commonly known as: VENTOLIN HFA Inhale 1-2 puffs into the lungs every 4 (four) hours as needed.   benzonatate 100 MG capsule Commonly known as: TESSALON Take 100 mg by mouth 3 (three) times daily as needed.   cetirizine 10 MG tablet Commonly known as: ZYRTEC Take 10 mg by mouth daily.   dapsone 100 MG tablet Take 100 mg by mouth daily.   escitalopram 10 MG tablet Commonly known as: LEXAPRO Take 1 tablet (10 mg total) by mouth daily.   fluconazole 200 MG tablet Commonly known as: DIFLUCAN Take 200 mg by mouth daily.   levofloxacin 500 MG tablet Commonly known as: LEVAQUIN Take 500 mg by mouth daily. What changed: Another medication with the same name was removed. Continue taking this  medication, and follow the directions you see here.   Sprycel 70 MG tablet Generic drug: dasatinib Take 70 mg by mouth daily.   valACYclovir 500 MG tablet Commonly known as: VALTREX Take 500 mg by mouth daily.         Follow-up Information     Jon Billings, NP Follow up in 1 week(s).   Specialty: Nurse Practitioner Contact information: [place] [address] [phone]                 Allergies  Allergen Reactions   Other     Pt cant take any pain reliever or fever reducer due to condition.   Sulfa Antibiotics Anaphylaxis   Erythromycin Hives   Zithromax [Azithromycin]      The results of significant diagnostics from this hospitalization (including imaging, microbiology, ancillary and laboratory) are listed below for reference.   Consultations:   Procedures/Studies: CT Angio Chest PE W and/or Wo Contrast  Result Date: 12/22/2021 CLINICAL DATA:  Sepsis,  leukemia, fever EXAM: CT ANGIOGRAPHY CHEST CT ABDOMEN AND PELVIS WITH CONTRAST TECHNIQUE: Multidetector CT imaging of the chest was performed using the standard protocol during bolus administration of intravenous contrast. Multiplanar CT image reconstructions and MIPs were obtained to evaluate the vascular anatomy. Multidetector CT imaging of the abdomen and pelvis was performed using the standard protocol during bolus administration of intravenous contrast. RADIATION DOSE REDUCTION: This exam was performed according to the departmental dose-optimization program which includes automated exposure control, adjustment of the mA and/or kV according to patient size and/or use of iterative reconstruction technique. CONTRAST:  146m OMNIPAQUE IOHEXOL 350 MG/ML SOLN COMPARISON:  CT abdomen pelvis 01/21/2016 FINDINGS: CTA CHEST FINDINGS Cardiovascular: No significant coronary artery calcification. Global cardiac size within normal limits. No pericardial effusion. Central pulmonary arteries are normal  caliber. No intraluminal filling defect identified through the segmental level to suggest acute pulmonary embolism. Thoracic aorta is unremarkable. Right internal jugular chest port tip is seen within the superior right atrium. Mediastinum/Nodes: Visualized thyroid is unremarkable. No pathologic thoracic adenopathy. Esophagus unremarkable. Small hiatal hernia. Lungs/Pleura: There is left lower lobe consolidation, likely infectious in the acute setting. The lungs are otherwise clear. No pneumothorax or pleural effusion. Central airways are widely patent. Musculoskeletal: No acute bone abnormality. No lytic or blastic bone lesion. Review of the MIP images confirms the above findings. CT ABDOMEN and PELVIS FINDINGS Hepatobiliary: No focal liver abnormality is seen. No gallstones, gallbladder wall thickening, or biliary dilatation. Pancreas: Unremarkable Spleen: Normal in size without focal abnormality. Adrenals/Urinary Tract: The adrenal glands are unremarkable. The kidneys are normal in size and position. There are punctate 1-2 mm nonobstructing calculi seen scattered throughout the kidneys bilaterally. Mild right hydronephrosis is present, however, no ureteral calculi are identified. This may reflect the residua of a recently passed calculus or an acute inflammatory process. Left kidney is unremarkable. No enhancing intrarenal masses. No perinephric fluid collections. The bladder is unremarkable. Stomach/Bowel: Stomach is within normal limits. Appendix appears normal. No evidence of bowel wall thickening, distention, or inflammatory changes. Vascular/Lymphatic: No significant vascular findings are present. No enlarged abdominal or pelvic lymph nodes. Reproductive: Uterus and bilateral adnexa are unremarkable. Other: No abdominal wall hernia.  Rectum unremarkable. Musculoskeletal: No acute bone abnormality. No lytic or blastic bone lesion. Review of the MIP images confirms the above findings. IMPRESSION: No pulmonary  embolism. Left lower lobe consolidation in keeping with acute lobar pneumonia in the appropriate clinical setting. Small hiatal hernia. Mild bilateral nonobstructing nephrolithiasis. Mild right hydronephrosis. No definite obstructing lesion or calculus identified. This may relate to a recently passed calculus or reflect a superimposed inflammatory process such as pyelonephritis. Correlation with urinalysis and urine culture may be helpful. Electronically Signed   By: AFidela SalisburyM.D.   On: 12/22/2021 23:59   CT ABDOMEN PELVIS W CONTRAST  Result Date: 12/22/2021 CLINICAL DATA:  Sepsis, leukemia, fever EXAM: CT ANGIOGRAPHY CHEST CT ABDOMEN AND PELVIS WITH CONTRAST TECHNIQUE: Multidetector CT imaging of the chest was performed using the standard protocol during bolus administration of intravenous contrast. Multiplanar CT image reconstructions and MIPs were obtained to evaluate the vascular anatomy. Multidetector CT imaging of the abdomen and pelvis was performed using the standard protocol during bolus administration of intravenous contrast. RADIATION DOSE REDUCTION: This exam was performed according to the departmental dose-optimization program which includes automated exposure control, adjustment of the mA and/or kV according to patient size and/or use of iterative reconstruction technique. CONTRAST:  1066mOMNIPAQUE IOHEXOL 350 MG/ML SOLN COMPARISON:  CT abdomen  pelvis 01/21/2016 FINDINGS: CTA CHEST FINDINGS Cardiovascular: No significant coronary artery calcification. Global cardiac size within normal limits. No pericardial effusion. Central pulmonary arteries are normal caliber. No intraluminal filling defect identified through the segmental level to suggest acute pulmonary embolism. Thoracic aorta is unremarkable. Right internal jugular chest port tip is seen within the superior right atrium. Mediastinum/Nodes: Visualized thyroid is unremarkable. No pathologic thoracic adenopathy. Esophagus unremarkable.  Small hiatal hernia. Lungs/Pleura: There is left lower lobe consolidation, likely infectious in the acute setting. The lungs are otherwise clear. No pneumothorax or pleural effusion. Central airways are widely patent. Musculoskeletal: No acute bone abnormality. No lytic or blastic bone lesion. Review of the MIP images confirms the above findings. CT ABDOMEN and PELVIS FINDINGS Hepatobiliary: No focal liver abnormality is seen. No gallstones, gallbladder wall thickening, or biliary dilatation. Pancreas: Unremarkable Spleen: Normal in size without focal abnormality. Adrenals/Urinary Tract: The adrenal glands are unremarkable. The kidneys are normal in size and position. There are punctate 1-2 mm nonobstructing calculi seen scattered throughout the kidneys bilaterally. Mild right hydronephrosis is present, however, no ureteral calculi are identified. This may reflect the residua of a recently passed calculus or an acute inflammatory process. Left kidney is unremarkable. No enhancing intrarenal masses. No perinephric fluid collections. The bladder is unremarkable. Stomach/Bowel: Stomach is within normal limits. Appendix appears normal. No evidence of bowel wall thickening, distention, or inflammatory changes. Vascular/Lymphatic: No significant vascular findings are present. No enlarged abdominal or pelvic lymph nodes. Reproductive: Uterus and bilateral adnexa are unremarkable. Other: No abdominal wall hernia.  Rectum unremarkable. Musculoskeletal: No acute bone abnormality. No lytic or blastic bone lesion. Review of the MIP images confirms the above findings. IMPRESSION: No pulmonary embolism. Left lower lobe consolidation in keeping with acute lobar pneumonia in the appropriate clinical setting. Small hiatal hernia. Mild bilateral nonobstructing nephrolithiasis. Mild right hydronephrosis. No definite obstructing lesion or calculus identified. This may relate to a recently passed calculus or reflect a superimposed  inflammatory process such as pyelonephritis. Correlation with urinalysis and urine culture may be helpful. Electronically Signed   By: Fidela Salisbury M.D.   On: 12/22/2021 23:59   CT L-SPINE NO CHARGE  Result Date: 12/23/2021 CLINICAL DATA:  Fever, recently leukemia diagnosis EXAM: CT LUMBAR SPINE WITHOUT CONTRAST TECHNIQUE: Multidetector CT imaging of the lumbar spine was performed without intravenous contrast administration. Multiplanar CT image reconstructions were also generated. RADIATION DOSE REDUCTION: This exam was performed according to the departmental dose-optimization program which includes automated exposure control, adjustment of the mA and/or kV according to patient size and/or use of iterative reconstruction technique. COMPARISON:  No dedicated CT of the lumbar spine correlation is made with 01/21/2016 CT abdomen pelvis FINDINGS: Segmentation: 5 lumbar type vertebrae. Alignment: No listhesis.  Minimal levocurvature Vertebrae: No acute fracture or suspicious osseous lesion. No endplate erosive changes to suggest discitis osteomyelitis. Paraspinal and other soft tissues: Please see same-day CT abdomen pelvis for soft tissue findings. Disc levels: L4-L5: Mild disc bulge with left subarticular and foraminal protrusion, which narrows the left lateral recess. No spinal canal stenosis or neural foraminal narrowing. L5-S1: Broad-based disc bulge. No spinal canal stenosis. Mild left neural foraminal narrowing. IMPRESSION: 1. No acute osseous abnormality or listhesis in the lumbar spine. 2. L5-S1 mild left neural foraminal narrowing. 3. L4-L5 narrowing of the left lateral recess, which could affect the descending left L5 nerve roots. Electronically Signed   By: Merilyn Baba M.D.   On: 12/23/2021 00:13   DG [facility]  Result Date: 12/22/2021 CLINICAL DATA:  Sepsis EXAM: PORTABLE CHEST 1 VIEW COMPARISON:  None Available. FINDINGS: The heart size and mediastinal contours are within normal limits.  Right internal jugular dual chamber chest port is seen with its tip within the superior vena cava, new since prior examination. Both lungs are clear. The visualized skeletal structures are unremarkable. IMPRESSION: No radiographic evidence of acute cardiopulmonary disease. Electronically Signed   By: Fidela Salisbury M.D.   On: 12/22/2021 22:25      Labs: BNP (last 3 results) No results for input(s): BNP in the last 8760 hours. Basic Metabolic Panel: Recent Labs  Lab 12/23/21 0530 12/24/21 0445 12/25/21 0500 12/26/21 0440 12/27/21 0328  NA 139 136 140 140 141  K 3.8 3.5 3.7 4.4 3.6  CL 110 109 109 108 106  CO2 21* '23 24 25 26  '$ GLUCOSE 106* 98 118* 138* 99  BUN 14 17 22* 17 21*  CREATININE 0.54 0.61 0.72 0.59 0.55  CALCIUM 8.7* 8.7* 8.9 9.0 8.6*  MG  --  2.0 2.0 2.3 2.3   Liver Function Tests: Recent Labs  Lab 12/22/21 2222  AST 16  ALT 28  ALKPHOS 52  BILITOT 0.8  PROT 7.5  ALBUMIN 3.9   No results for input(s): LIPASE, AMYLASE in the last 168 hours. No results for input(s): AMMONIA in the last 168 hours. CBC: Recent Labs  Lab 12/22/21 2222 12/23/21 0530 12/24/21 0445 12/25/21 0500 12/26/21 0440 12/27/21 0328  WBC 0.5* 0.6* 2.0* 12.3* 32.7* 49.9*  NEUTROABS 0.0*  --  0.5* 5.9  --   --   HGB 9.5* 8.5* 7.9* 8.0* 7.6* 7.3*  HCT 28.7* 25.6* 23.7* 24.1* 23.5* 22.8*  MCV 87.5 87.1 87.1 86.7 88.0 89.1  PLT 22* 21* 50* 138* 173 207   Cardiac Enzymes: No results for input(s): CKTOTAL, CKMB, CKMBINDEX, TROPONINI in the last 168 hours. BNP: Invalid input(s): POCBNP CBG: No results for input(s): GLUCAP in the last 168 hours. D-Dimer No results for input(s): DDIMER in the last 72 hours. Hgb A1c No results for input(s): HGBA1C in the last 72 hours. Lipid Profile No results for input(s): CHOL, HDL, LDLCALC, TRIG, CHOLHDL, LDLDIRECT in the last 72 hours. Thyroid function studies No results for input(s): TSH, T4TOTAL, T3FREE, THYROIDAB in the last 72 hours.  Invalid  input(s): FREET3 Anemia work up No results for input(s): VITAMINB12, FOLATE, FERRITIN, TIBC, IRON, RETICCTPCT in the last 72 hours. Urinalysis    Component Value Date/Time   COLORURINE STRAW (A) 12/23/2021 0240   APPEARANCEUR CLEAR (A) 12/23/2021 0240   APPEARANCEUR Clear 08/27/2021 1451   LABSPEC 1.032 (H) 12/23/2021 0240   PHURINE 5.0 12/23/2021 0240   GLUCOSEU NEGATIVE 12/23/2021 0240   HGBUR LARGE (A) 12/23/2021 0240   BILIRUBINUR NEGATIVE 12/23/2021 0240   BILIRUBINUR Negative 08/27/2021 1451   KETONESUR NEGATIVE 12/23/2021 0240   PROTEINUR NEGATIVE 12/23/2021 0240   UROBILINOGEN 0.2 11/09/2011 1354   NITRITE NEGATIVE 12/23/2021 0240   LEUKOCYTESUR NEGATIVE 12/23/2021 0240   Sepsis Labs Invalid input(s): PROCALCITONIN,  WBC,  LACTICIDVEN Microbiology Recent Results (from the past 240 hour(s))  Resp Panel by RT-PCR (Flu A&B, Covid) Nasopharyngeal Swab     Status: None   Collection Time: 12/22/21 10:22 PM   Specimen: Nasopharyngeal Swab; Nasopharyngeal(NP) swabs in vial transport medium  Result Value Ref Range Status   SARS Coronavirus 2 by RT PCR NEGATIVE NEGATIVE Final    Comment: (NOTE) SARS-CoV-2 target nucleic acids are NOT DETECTED.  The SARS-CoV-2 RNA is generally  detectable in upper respiratory specimens during the acute phase of infection. The lowest concentration of SARS-CoV-2 viral copies this assay can detect is 138 copies/mL. A negative result does not preclude SARS-Cov-2 infection and should not be used as the sole basis for treatment or other patient management decisions. A negative result may occur with  improper specimen collection/handling, submission of specimen other than nasopharyngeal swab, presence of viral mutation(s) within the areas targeted by this assay, and inadequate number of viral copies(<138 copies/mL). A negative result must be combined with clinical observations, patient history, and epidemiological information. The expected result is  Negative.  Fact Sheet for Patients:  EntrepreneurPulse.com.au  Fact Sheet for Healthcare Providers:  IncredibleEmployment.be  This test is no t yet approved or cleared by the [place] FDA and  has been authorized for detection and/or diagnosis of SARS-CoV-2 by FDA under an Emergency Use Authorization (EUA). This EUA will remain  in effect (meaning this test can be used) for the duration of the COVID-19 declaration under Section 564(b)(1) of the Act, 21 U.S.C.section 360bbb-3(b)(1), unless the authorization is terminated  or revoked sooner.       Influenza A by PCR NEGATIVE NEGATIVE Final   Influenza B by PCR NEGATIVE NEGATIVE Final    Comment: (NOTE) The Xpert Xpress SARS-CoV-2/FLU/RSV plus assay is intended as an aid in the diagnosis of influenza from Nasopharyngeal swab specimens and should not be used as a sole basis for treatment. Nasal washings and aspirates are unacceptable for Xpert Xpress SARS-CoV-2/FLU/RSV testing.  Fact Sheet for Patients: EntrepreneurPulse.com.au  Fact Sheet for Healthcare Providers: IncredibleEmployment.be  This test is not yet approved or cleared by the [place] FDA and has been authorized for detection and/or diagnosis of SARS-CoV-2 by FDA under an Emergency Use Authorization (EUA). This EUA will remain in effect (meaning this test can be used) for the duration of the COVID-19 declaration under Section 564(b)(1) of the Act, 21 U.S.C. section 360bbb-3(b)(1), unless the authorization is terminated or revoked.  Performed at [facility], [address] [place], [place] [address]   Blood Culture (routine x 2)     Status: None   Collection Time: 12/22/21 10:22 PM   Specimen: BLOOD  Result Value Ref Range Status   Specimen Description BLOOD PORTA CATH  Final   Special Requests   Final    BOTTLES DRAWN AEROBIC AND ANAEROBIC Blood Culture adequate volume    Culture   Final    NO GROWTH 5 DAYS Performed at [facility], [address] [place], [place] [address]    Report Status 12/27/2021 FINAL  Final  Blood Culture (routine x 2)     Status: None   Collection Time: 12/22/21 10:22 PM   Specimen: BLOOD  Result Value Ref Range Status   Specimen Description BLOOD RIGHT ASSIST CONTROL  Final   Special Requests   Final    BOTTLES DRAWN AEROBIC AND ANAEROBIC Blood Culture results may not be optimal due to an excessive volume of blood received in culture bottles   Culture   Final    NO GROWTH 5 DAYS Performed at [facility], [address] [place], [place] [address]    Report Status 12/27/2021 FINAL  Final  Urine Culture     Status: None   Collection Time: 12/22/21 10:22 PM   Specimen: Urine, Random  Result Value Ref Range Status   Specimen Description   Final    URINE, RANDOM Performed at [facility], [address] [place], [place] [address]    Special Requests   Final    NONE Performed at [facility], [address] [place], [place] [address]    Culture   Final    NO GROWTH Performed at [facility], [place] [address] [place], [place] [address]    Report Status 12/24/2021 FINAL  Final  Respiratory (~20 pathogens) panel by PCR     Status: None   Collection Time: 12/23/21 10:22 AM   Specimen: Nasopharyngeal Swab; Respiratory  Result Value Ref Range Status   Adenovirus NOT DETECTED NOT DETECTED Final   Coronavirus 229E NOT DETECTED NOT DETECTED Final    Comment: (NOTE) The Coronavirus on the Respiratory Panel, DOES NOT test for the novel  Coronavirus (2019 nCoV)    Coronavirus HKU1 NOT DETECTED NOT DETECTED Final   Coronavirus NL63 NOT DETECTED NOT DETECTED Final   Coronavirus OC43 NOT DETECTED NOT DETECTED Final   Metapneumovirus NOT DETECTED NOT DETECTED Final   Rhinovirus / Enterovirus NOT DETECTED NOT DETECTED Final   Influenza A NOT DETECTED NOT DETECTED Final    Influenza B NOT DETECTED NOT DETECTED Final   Parainfluenza Virus 1 NOT DETECTED NOT DETECTED Final   Parainfluenza Virus 2 NOT DETECTED NOT DETECTED Final   Parainfluenza Virus 3 NOT DETECTED NOT DETECTED Final   Parainfluenza Virus 4 NOT DETECTED NOT DETECTED Final   Respiratory Syncytial Virus NOT DETECTED NOT DETECTED Final   Bordetella pertussis NOT DETECTED NOT DETECTED Final   Bordetella Parapertussis NOT DETECTED NOT DETECTED Final   Chlamydophila pneumoniae NOT DETECTED NOT DETECTED Final   Mycoplasma pneumoniae NOT DETECTED NOT DETECTED Final    Comment: Performed at [facility], [place]. [address], [place], [place] [address]  MRSA Next Gen by PCR, Nasal     Status: None   Collection Time: 12/23/21 11:38 AM   Specimen: Nasal Mucosa; Nasal Swab  Result Value Ref Range Status   MRSA by PCR Next Gen NOT DETECTED NOT DETECTED Final    Comment: (NOTE) The GeneXpert MRSA Assay (FDA approved for NASAL specimens only), is one component of a comprehensive MRSA colonization surveillance program. It is not intended to diagnose MRSA infection nor to guide or monitor treatment for MRSA infections. Test performance is not FDA approved in patients less than 83 years old. Performed at [facility], [address] [place],  [address]      Total time spend on discharging this patient, including the last patient exam, discussing the hospital stay, instructions for ongoing care as it relates to all pertinent caregivers, as well as preparing the medical discharge records, prescriptions, and/or referrals as applicable, is 40 minutes.    Enzo Bi, MD  [facility] 12/27/2021, 1:32 PM

## 2021-12-27 NOTE — Plan of Care (Signed)
  Problem: Education: Goal: Knowledge of General Education information will improve Description: Including pain rating scale, medication(s)/side effects and non-pharmacologic comfort measures Outcome: Adequate for Discharge   Problem: Health Behavior/Discharge Planning: Goal: Ability to manage health-related needs will improve Outcome: Adequate for Discharge   Problem: Clinical Measurements: Goal: Ability to maintain clinical measurements within normal limits will improve Outcome: Adequate for Discharge Goal: Will remain free from infection Outcome: Adequate for Discharge Goal: Cardiovascular complication will be avoided Outcome: Adequate for Discharge   Problem: Skin Integrity: Goal: Risk for impaired skin integrity will decrease Outcome: Adequate for Discharge

## 2021-12-27 NOTE — Progress Notes (Signed)
Lateral port.

## 2021-12-27 NOTE — Progress Notes (Signed)
MD order received in [facility] to discharge pt home today; verbally reviewed AVS with pt, no new Rxs; pt verbalized understanding with no questions voiced at this time; pt discharged via wheelchair by nursing to the [facility] entrance

## 2021-12-27 NOTE — Progress Notes (Signed)
Pt's home medication, Sprycel, previously stored in [place]; returned to pt

## 2021-12-28 ENCOUNTER — Encounter: Admit: 2021-12-28 | Discharge: 2021-12-29 | Payer: PRIVATE HEALTH INSURANCE

## 2021-12-28 ENCOUNTER — Ambulatory Visit: Admit: 2021-12-28 | Discharge: 2021-12-29 | Payer: PRIVATE HEALTH INSURANCE

## 2021-12-28 LAB — COMPREHENSIVE METABOLIC PANEL
ALBUMIN: 3.4 g/dL (ref 3.4–5.0)
ALKALINE PHOSPHATASE: 111 U/L (ref 46–116)
ALT (SGPT): 35 U/L (ref 10–49)
ANION GAP: 9 mmol/L (ref 5–14)
AST (SGOT): 32 U/L (ref <=34)
BILIRUBIN TOTAL: 0.3 mg/dL (ref 0.3–1.2)
BLOOD UREA NITROGEN: 20 mg/dL (ref 9–23)
BUN / CREAT RATIO: 34
CALCIUM: 9.2 mg/dL (ref 8.7–10.4)
CHLORIDE: 107 mmol/L (ref 98–107)
CO2: 26 mmol/L (ref 20.0–31.0)
CREATININE: 0.59 mg/dL — ABNORMAL LOW
EGFR CKD-EPI (2021) FEMALE: >90 mL/min/{1.73_m2} (ref >=60)
GLUCOSE RANDOM: 120 mg/dL (ref 70–179)
POTASSIUM: 3.4 mmol/L (ref 3.4–4.8)
PROTEIN TOTAL: 6.7 g/dL (ref 5.7–8.2)
SODIUM: 142 mmol/L (ref 135–145)

## 2021-12-28 LAB — CBC W/ AUTO DIFF
BASOPHILS ABSOLUTE COUNT: 0.1 10*9/L (ref 0.0–0.1)
BASOPHILS RELATIVE PERCENT: 0.1 %
EOSINOPHILS ABSOLUTE COUNT: 0 10*9/L (ref 0.0–0.5)
EOSINOPHILS RELATIVE PERCENT: 0 %
HEMATOCRIT: 24.2 % — ABNORMAL LOW (ref 34.0–44.0)
HEMOGLOBIN: 7.9 g/dL — ABNORMAL LOW (ref 11.3–14.9)
LYMPHOCYTES ABSOLUTE COUNT: 2.2 10*9/L (ref 1.1–3.6)
LYMPHOCYTES RELATIVE PERCENT: 3.6 %
MEAN CORPUSCULAR HEMOGLOBIN CONC: 32.8 g/dL (ref 32.0–36.0)
MEAN CORPUSCULAR HEMOGLOBIN: 28.5 pg (ref 25.9–32.4)
MEAN CORPUSCULAR VOLUME: 87 fL (ref 77.6–95.7)
MEAN PLATELET VOLUME: 7.9 fL (ref 6.8–10.7)
MONOCYTES ABSOLUTE COUNT: 1.8 10*9/L — ABNORMAL HIGH (ref 0.3–0.8)
MONOCYTES RELATIVE PERCENT: 2.9 %
NEUTROPHILS ABSOLUTE COUNT: 57.8 10*9/L — ABNORMAL HIGH (ref 1.8–7.8)
NEUTROPHILS RELATIVE PERCENT: 93.4 %
PLATELET COUNT: 325 10*9/L (ref 150–450)
RED BLOOD CELL COUNT: 2.78 10*12/L — ABNORMAL LOW (ref 3.95–5.13)
RED CELL DISTRIBUTION WIDTH: 20.5 % — ABNORMAL HIGH (ref 12.2–15.2)
WBC ADJUSTED: 61.9 10*9/L (ref 3.6–11.2)

## 2021-12-28 LAB — LACTATE DEHYDROGENASE: LACTATE DEHYDROGENASE: 1032 U/L — ABNORMAL HIGH (ref 120–246)

## 2021-12-28 LAB — SLIDE REVIEW

## 2021-12-28 MED ORDER — ALBUTEROL SULFATE HFA 90 MCG/ACTUATION AEROSOL INHALER
Freq: Four times a day (QID) | RESPIRATORY_TRACT | 0 refills | 0 days | Status: CP | PRN
Start: 2021-12-28 — End: ?

## 2021-12-28 MED ADMIN — sodium chloride (NS) 0.9 % infusion: 50 mL/h | INTRAVENOUS | @ 15:00:00

## 2021-12-28 MED ADMIN — sodium chloride (NS) 0.9 % infusion: 50 mL/h | INTRAVENOUS | @ 16:00:00

## 2021-12-28 MED ADMIN — heparin, porcine (PF) 100 unit/mL injection 500 Units: 500 [IU] | INTRAVENOUS | @ 19:00:00 | Stop: 2021-12-29

## 2021-12-28 MED ADMIN — alteplase (ACTIVASE) injection small catheter clearance 2 mg: 2 mg | INTRAVENOUS | @ 17:00:00 | Stop: 2021-12-28

## 2021-12-28 NOTE — Unmapped (Unsigned)
Pt arrived ambulatory and stable accompanied by her significant other.  Bilateral ports accessed at chairside using sterile technique.  Lateral port was unable to give blood return, Cath Flo administered and left to dwell for 1 hour.  After 1 hour, blood return brisk in lateral port, Cath flo removed.  Pt in need of 2 units of pRBC's.  Pt tolerated treatment well, no further interventions at this time.  Pt discharged in stable condition.

## 2021-12-30 ENCOUNTER — Ambulatory Visit
Admit: 2021-12-30 | Discharge: 2021-12-30 | Payer: PRIVATE HEALTH INSURANCE | Attending: Student in an Organized Health Care Education/Training Program | Primary: Student in an Organized Health Care Education/Training Program

## 2021-12-30 ENCOUNTER — Ambulatory Visit: Admit: 2021-12-30 | Discharge: 2021-12-30 | Payer: PRIVATE HEALTH INSURANCE

## 2021-12-30 ENCOUNTER — Ambulatory Visit
Admit: 2021-12-30 | Discharge: 2021-12-30 | Payer: PRIVATE HEALTH INSURANCE | Attending: Adult Health | Primary: Adult Health

## 2021-12-30 ENCOUNTER — Other Ambulatory Visit: Admit: 2021-12-30 | Discharge: 2021-12-30 | Payer: PRIVATE HEALTH INSURANCE

## 2021-12-30 DIAGNOSIS — C9100 Acute lymphoblastic leukemia not having achieved remission: Principal | ICD-10-CM

## 2021-12-30 LAB — COMPREHENSIVE METABOLIC PANEL
ALBUMIN: 3.7 g/dL (ref 3.4–5.0)
ALKALINE PHOSPHATASE: 125 U/L — ABNORMAL HIGH (ref 46–116)
ALT (SGPT): 28 U/L (ref 10–49)
ANION GAP: 10 mmol/L (ref 5–14)
AST (SGOT): 24 U/L (ref <=34)
BILIRUBIN TOTAL: 0.3 mg/dL (ref 0.3–1.2)
BLOOD UREA NITROGEN: 15 mg/dL (ref 9–23)
BUN / CREAT RATIO: 26
CALCIUM: 9.1 mg/dL (ref 8.7–10.4)
CHLORIDE: 106 mmol/L (ref 98–107)
CO2: 24 mmol/L (ref 20.0–31.0)
CREATININE: 0.57 mg/dL — ABNORMAL LOW
EGFR CKD-EPI (2021) FEMALE: >90 mL/min/{1.73_m2} (ref >=60)
GLUCOSE RANDOM: 148 mg/dL (ref 70–179)
POTASSIUM: 3.8 mmol/L (ref 3.4–4.8)
PROTEIN TOTAL: 7.2 g/dL (ref 5.7–8.2)
SODIUM: 140 mmol/L (ref 135–145)

## 2021-12-30 LAB — CBC W/ AUTO DIFF
BASOPHILS ABSOLUTE COUNT: 0.2 10*9/L — ABNORMAL HIGH (ref 0.0–0.1)
BASOPHILS RELATIVE PERCENT: 0.3 %
EOSINOPHILS ABSOLUTE COUNT: 0 10*9/L (ref 0.0–0.5)
EOSINOPHILS RELATIVE PERCENT: 0.1 %
HEMATOCRIT: 36.5 % (ref 34.0–44.0)
HEMOGLOBIN: 11.9 g/dL (ref 11.3–14.9)
LYMPHOCYTES ABSOLUTE COUNT: 0.8 10*9/L — ABNORMAL LOW (ref 1.1–3.6)
LYMPHOCYTES RELATIVE PERCENT: 1.7 %
MEAN CORPUSCULAR HEMOGLOBIN CONC: 32.5 g/dL (ref 32.0–36.0)
MEAN CORPUSCULAR HEMOGLOBIN: 27.9 pg (ref 25.9–32.4)
MEAN CORPUSCULAR VOLUME: 85.7 fL (ref 77.6–95.7)
MEAN PLATELET VOLUME: 7.7 fL (ref 6.8–10.7)
MONOCYTES ABSOLUTE COUNT: 3 10*9/L — ABNORMAL HIGH (ref 0.3–0.8)
MONOCYTES RELATIVE PERCENT: 6.1 %
NEUTROPHILS ABSOLUTE COUNT: 45.5 10*9/L — ABNORMAL HIGH (ref 1.8–7.8)
NEUTROPHILS RELATIVE PERCENT: 91.8 %
PLATELET COUNT: 354 10*9/L (ref 150–450)
RED BLOOD CELL COUNT: 4.26 10*12/L (ref 3.95–5.13)
RED CELL DISTRIBUTION WIDTH: 17.7 % — ABNORMAL HIGH (ref 12.2–15.2)
WBC ADJUSTED: 49.6 10*9/L — ABNORMAL HIGH (ref 3.6–11.2)

## 2021-12-30 LAB — LACTATE DEHYDROGENASE: LACTATE DEHYDROGENASE: 837 U/L — ABNORMAL HIGH (ref 120–246)

## 2021-12-30 MED ORDER — DASATINIB 70 MG TABLET
ORAL_TABLET | Freq: Every day | ORAL | 7 refills | 30 days | Status: CP
Start: 2021-12-30 — End: ?
  Filled 2022-02-11: qty 30, 30d supply, fill #0

## 2021-12-30 MED ORDER — GUAIFENESIN ER 600 MG TABLET, EXTENDED RELEASE 12 HR
ORAL_TABLET | Freq: Two times a day (BID) | ORAL | 0 refills | 30 days | Status: CP | PRN
Start: 2021-12-30 — End: 2022-01-29

## 2021-12-30 MED ORDER — LEVALBUTEROL 0.31 MG/3 ML SOLUTION FOR NEBULIZATION
RESPIRATORY_TRACT | 0 refills | 3 days | Status: CP | PRN
Start: 2021-12-30 — End: 2022-12-30

## 2021-12-30 MED ORDER — NIFED 0.3%/LIDOCA 1.5% W/PETRO
Freq: Every day | TOPICAL | 0 refills | 0 days | Status: CP
Start: 2021-12-30 — End: ?

## 2021-12-30 MED ADMIN — heparin, porcine (PF) 100 unit/mL injection 500 Units: 500 [IU] | INTRAVENOUS | @ 14:00:00 | Stop: 2021-12-31

## 2021-12-30 NOTE — Unmapped (Unsigned)
Port was heparin locked and de=accessed in clinic, infusion was called and made aware that PT would not be coming to appointment.

## 2021-12-30 NOTE — Unmapped (Signed)
I am glad you are starting to feel better.  I prescribed the nebulizer to use as needed.  I also prescribed guaifenesin to help with the nasal congestion.    Stop the fluconazole.  Complete 2 more days of levofloxacin 750 then stop.    I will see you back in 1 week and tentatively plan to admit you early the following week.

## 2021-12-30 NOTE — Unmapped (Signed)
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 She denies fever, bleeding, nausea, diarrhea, constipation, skin issues. Overall from a chemotherapy perspective she is doing well. She continues to take dasatinib 70 mg daily. She had considered famotidine for some GERD symptoms with instructions around administration, but reports she hasn't been needing that. She reports a heavier than normal menstrual cycle that is just finishing.    On labs, CBC shows recovery following C2 including Hgb 11.9, PLT 354, ANC 45.5. CMP WNL.   BP 121/79, HR 108    Oncologic History:  Oncology History Overview Note   Referring/Local Oncologist:    Diagnosis:   10/29/2021  Bone marrow, left iliac, aspiration and biopsy  -  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 The majority of the abnormal cells (64/88) contained an additional BCR/ABL1 fusion signal, while 9/88 abnormal cells contained an additional ABL1 and ASS1 signal.       Genetics:   Karyotype/FISH:   RESULTS   Date Value Ref Range Status   10/28/2021   Final    NOTE: This report reflects a combined study from a peripheral blood and a bone marrow core biopsy. Eleven cells from the peripheral blood and nine cells from the bone marrow core biopsy were analyzed. The BCR/ABL1 FISH analysis was performed on the peripheral blood.     Abnormal Karyotype:  47,XX,t(9;22)(q34;q11.2),+der(22)t(9;22)[18]/46,XX[2]    Abnormal FISH:  A BCR/ABL1 interphase FISH assay showed a signal pattern consistent with a BCR::ABL1 rearrangement and the 9;22 translocation in 88% of the 100 cells scored. The majority of the abnormal cells (64/88) contained an additional BCR/ABL1 fusion signal, while 9/88 abnormal cells contained an additional ABL1 and ASS1 signal.           Pertinent Phenotypic data:  CD19 98%  CD20 on diagnosis 51%  CD22 98%           B-cell acute lymphoblastic leukemia (ALL) (CMS-HCC)   10/29/2021 Initial Diagnosis    B-cell acute lymphoblastic leukemia (ALL) (CMS-HCC)       10/30/2021 -  Chemotherapy    IP/OP LEUKEMIA GRAAPH-2005 < 60 YO (OP PEGFILGRASTIM ON DAY 7)  [No description for this plan]       01/06/2022 Endocrine/Hormone Therapy    OP LEUPROLIDE (LUPRON) 11.25 MG EVERY 3 MONTHS  Plan Provider: [name], MD           Weight and Vitals:  Wt Readings from Last 3 Encounters:   12/30/21 80.4 kg (177 lb 4 oz)   12/28/21 81.7 kg (180 lb 3.2 oz)   12/17/21 80.7 kg (178 lb)     Temp Readings from Last 3 Encounters:   12/30/21 36.3 ??C (97.4 ??F) (Temporal)   12/28/21 36.6 ??C (97.9 ??F) (Oral)  12/21/21 36.9 ??C (98.4 ??F) (Oral)     BP Readings from Last 3 Encounters:   12/30/21 121/79   12/28/21 134/77   12/21/21 107/69     Pulse Readings from Last 3 Encounters:   12/30/21 108   12/28/21 90   12/21/21 97       Pertinent Labs:  Lab on 12/30/2021   Component Date Value Ref Range Status    Sodium 12/30/2021 140  135 - 145 mmol/L Final    Potassium 12/30/2021 3.8  3.4 - 4.8 mmol/L Final    Chloride 12/30/2021 106  98 - 107 mmol/L Final    CO2 12/30/2021 24.0  20.0 - 31.0 mmol/L Final    Anion Gap 12/30/2021 10  5 - 14 mmol/L Final    BUN 12/30/2021 15  9 - 23 mg/dL Final    Creatinine 45/40/9811 0.57 (L)  0.60 - 0.80 mg/dL Final BUN/Creatinine Ratio 12/30/2021 26   Final    eGFR CKD-EPI (2021) Female 12/30/2021 >90  >=60 mL/min/1.76m2 Final    eGFR calculated with CKD-EPI 2021 equation in accordance with [employer] and [employer] of Nephrology Task Force recommendations.    Glucose 12/30/2021 148  70 - 179 mg/dL Final    Calcium 91/47/8295 9.1  8.7 - 10.4 mg/dL Final    Albumin 62/13/0865 3.7  3.4 - 5.0 g/dL Final    Total Protein 12/30/2021 7.2  5.7 - 8.2 g/dL Final    Total Bilirubin 12/30/2021 0.3  0.3 - 1.2 mg/dL Final    AST 78/46/9629 24  <=34 U/L Final    ALT 12/30/2021 28  10 - 49 U/L Final    Alkaline Phosphatase 12/30/2021 125 (H)  46 - 116 U/L Final    LDH 12/30/2021 837 (H)  120 - 246 U/L Final    WBC 12/30/2021 49.6 (H)  3.6 - 11.2 10*9/L Final    RBC 12/30/2021 4.26  3.95 - 5.13 10*12/L Final    HGB 12/30/2021 11.9  11.3 - 14.9 g/dL Final    HCT 52/84/1324 36.5  34.0 - 44.0 % Final    MCV 12/30/2021 85.7  77.6 - 95.7 fL Final    MCH 12/30/2021 27.9  25.9 - 32.4 pg Final    MCHC 12/30/2021 32.5  32.0 - 36.0 g/dL Final    RDW 40/05/2724 17.7 (H)  12.2 - 15.2 % Final    MPV 12/30/2021 7.7  6.8 - 10.7 fL Final    Platelet 12/30/2021 354  150 - 450 10*9/L Final    Neutrophils % 12/30/2021 91.8  % Final    Lymphocytes % 12/30/2021 1.7  % Final    Monocytes % 12/30/2021 6.1  % Final    Eosinophils % 12/30/2021 0.1  % Final    Basophils % 12/30/2021 0.3  % Final    Absolute Neutrophils 12/30/2021 45.5 (H)  1.8 - 7.8 10*9/L Final    Absolute Lymphocytes 12/30/2021 0.8 (L)  1.1 - 3.6 10*9/L Final    Absolute Monocytes 12/30/2021 3.0 (H)  0.3 - 0.8 10*9/L Final    Absolute Eosinophils 12/30/2021 0.0  0.0 - 0.5 10*9/L Final    Absolute Basophils 12/30/2021 0.2 (H)  0.0 - 0.1 10*9/L Final    Anisocytosis 12/30/2021 Slight (A)  Not Present Final       Allergies:   Allergies   Allergen Reactions    Erythromycin Hives    Sulfa (Sulfonamide Antibiotics) Anaphylaxis    Azithromycin        Drug  Interactions: none; avoid strong 3A4 inhibitors with VCR and dasatinib, and avoid PPI/H2 blockers with dasatinib      Current Medications:  Current Outpatient Medications   Medication Sig Dispense Refill    acetaminophen (TYLENOL 8 HOUR) 650 MG CR tablet Take 1 tablet (650 mg total) by mouth every eight (8) hours as needed for pain.      cetirizine (ZYRTEC) 10 MG tablet Take 1 tablet (10 mg total) by mouth nightly. 30 tablet 2    dapsone 100 MG tablet Take 1 tablet (100 mg total) by mouth daily. 30 tablet 2    diclofenac sodium (VOLTAREN) 1 % gel Apply 2 grams topically four (4) times a day as needed. 100 g 0    levalbuterol (XOPENEX) 0.31 mg/3 mL nebulizer solution Inhale 3 mL (0.31 mg total) by nebulization every four (4) hours as needed for wheezing (cough). 45 mL 0    levoFLOXacin (LEVAQUIN) 750 MG tablet Take 1 tablet (750 mg total) by mouth daily.      prochlorperazine (COMPAZINE) 10 MG tablet Take 1 tablet (10 mg total) by mouth every six (6) hours as needed for up to 14 days. 30 tablet 0    valACYclovir (VALTREX) 500 MG tablet Take 1 tablet (500 mg total) by mouth daily. 90 tablet 3    dasatinib (SPRYCEL) 70 MG tablet Take 1 tablet (70 mg total) by mouth daily. 30 tablet 7    guaiFENesin (MUCINEX) 600 mg 12 hr tablet Take 1 tablet (600 mg total) by mouth two (2) times a day as needed for congestion. 60 tablet 0    nifedipine 0.3% lidocaine 1.5% in petrolatum ointment Apply 1 application. topically daily. 100 g 0     No current facility-administered medications for this visit.       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 Provided med management and discussion of menses suppression    Plan:   Ph+ ALL  - Continue dasatinib 70 mg daily  - Will RTC in 1 week for consideration/planning of C3  - C4 (HD MTX) orders will be adjusted in preparation keeping in mind shortage.  - Continue to maintain IT intensity through cycle 2B    ID ppx/tx  - Completing levofloxacin PNA treatment 750 mg daily, states that she has a couple days left following her recent admission  - No fluconazole or levaquin ppx needed at this time (beyond her lvq treatment) as ANC > 0.5  - Continue valtrex 500 mg daily   - Continue dapsone 100 mg daily    Supportive care:  - Cough: ok to use mucinex and/or dextromethorphan prn. Using levalbuterol prn as well (albuterol makes her tachycardic)  - Hemorrhoids: nifedipine/lidocaine ointment sent to local pharmacy  - Menses suppression: when she returns next week 6/8 should receive her first 51-month injection of lupron, then next would be due ~8/31  - Nausea: compazine prn (not needing)  - GERD: ok to use pepcid but must dose exactly 2 hours after dasatinib dose      F/u:  Future Appointments   Date Time Provider Department Center   01/04/2022  9:00 AM ONCINF CHAIR 28 [id number]   01/06/2022  9:15 AM ADULT ONC LAB [facility]   01/06/2022  10:00 AM Sean Marrian Salvage, AGNP [id number]   01/06/2022 10:30 AM ONCHEM INJECTION [id number]       I spent 25 minutes with Patricia [name] in direct patient care.      Manfred Arch, PharmD, BCOP, CPP  Pager: [phone]

## 2021-12-30 NOTE — Unmapped (Signed)
Port accessed (medial lumen).  Labs drawn & sent for analysis.  To next appt.  Care provided by Yisroel Ramming RN.

## 2021-12-30 NOTE — Unmapped (Signed)
Day 21 Cycle 2 R-HyperCVAD - methotrexate cycle  [facility] Follow Up Visit Note     [name]  Patient Age: 46 y.o.  Encounter Date: 12/30/2021    Primary Care Provider:  [name], MD    Referring Physician:  [name], MD  [address]  [place],  [place] [address]    Assessment/Plan:  Patricia [name] is a 46 y.o. female with past medical history of chronic right shoulder and back pain, hypertension, anxiety, and recently diagnosed Ph+ ALL. S/p induction on GRAAPH-2005 bone marrow biopsy demonstrated morphologic CR, BCR/ABL p190 2/100,000.   MRD Interpretation, Bone Marrow    Comment: A small population of immature B cells is identified by flow cytometry that has an immunophenotypic profile consistent with normal B lymphoblasts (hematogones); however, the immunophenotype is not distinct enough from the patient's leukemia-associated immunophenotype to completely rule out residual disease. The suspicious population represents 0.05% of mononuclear cells.       Disease specific prognosis: It remains unclear whether or not Ph+ALL patients can be cured with chemotherapy alone plus TKI. R-Hyper-CVAD+dasatinib offers median DFS 31 mo and median OS 47 mo (Ravandi, Cancer 2015).     Prognosis is highly dependant upon achieving CMR. For patients who achieve CMR (neg by pcr) at 3 months, risk of relapse at 4 years is about 25% (Short, Blood 2016).     She is now s/p Cycle 2 R-HyperCVAD+ dasatinib and this cycle was complicated by neutropenic fevers, LLL pneumonia. Cell counts have recovered but she is still recovering from the pneumonia. Will defer admission a week and re-evaluate. She feels she did not tolerate 2 ITs in a week and requests spreading them out. I will discuss with Dr. Senaida Ores     Ph+ALL, in remission: On GRAAPH 2005 (per Patricia [name], et al. Randomized study of reduced intensity chemotherapy combined with imatinib in adults with Ph-positive ALL. Blood 2015 Jun 11;125(24):3711-9) with dasatinib for imatinib. This is C1 high dose TKI+Dex+Vincristine to dose intensification with (R)-HyperCVAD/MA (starting with MA, I.e. Part B or Even cycles).  TKI is continued indefinitely with hopes for at least 2 years.  She has partial (51%) CD20 expression.  Therefore we will proceed with rituximab in addition to hyperCVAD.   -Plan Cycle 3 A cycle R-HyperCVAD/MA+dasatinib    - Once sufficiently recovered from bronchitis  - Dasatinib to 70 mg on admission.    - Next bone marrow biopsy following cycle 3    Treatment Timeline:  10/29/2021: Bone marrow biopsy: Ph+ ALL, 51% expression of CD20  10/30/21: Cycle 1 day 1 GRAAPH-2005 induction  11/03/21: ITT #1   11/12/2021: IT #2  11/18/2021: IT #3  11/29/2021: Post cycle 1 bone marrow biopsy: Morphologic CR.  MRD by flow insufficient, p190 2/100,000  12/10/2021: Cycle 2 B cycle + rituximab  12/14/21: IT#4  12/17/21: IT#5    Pancytopenia due to chemotherapy  - counts currently recovered  - supportive care as detailed below when in nadir    Pneumonia, LLL; hospitalized 5/24-29 -s/p cefepime  - recovering well with residual cough  - PRN levalbuterol inhaler or nebulizer  - PRN guaifenesin     Psychosocial distress: She reports a moderate level of anxiety regarding the management of the above.   - Counseling given   - Consider ref to comprehensive cancer support program     Patient-centered care/Shared decision-making:   We discussed the plan above at length. The patient and her  husband actively contributed to the conversation. Specifically, her most important outcomes are:   Prolonging overall survival and Maintaining her overall functional activity     Supportive Care Needs: We recommend based on the patient???s underlying diagnosis and treatment history the following supportive care:    1. Antimicrobial prophylaxis:  ALL (in remission, receiving post-remission therapy): Bacterial: Levofloxacin 500mg  PO daily (when absolute neutrophils </= 0.5); Fungal: Fluconazole 200mg  PO daily (when absolute neutrophils </= 0.5);   Viral: Valacyclovir 500mg  PO daily (Continuous);   PJP:  SMX/TMP DS PO BID twice weekly (Sat/Sun)    2. Blood product support:  Leukoreduced blood products are required.  Irradiated blood products are preferred, but in case of urgent transfusion needs non-irradiated blood products may be used: 2 units for Hg <= 7.0.     Coordination of care:   RTC in 1 week  Tentatively plan admission for following week  Plan bmbx after the cycle    Patricia [name], AGPCNP-BC  Nurse Practitioner  Hematology/Oncology  [facility]      Patricia Aloe, MD was available  Leukemia Program  Division of Hematology  [facility]      Nurse Navigator (non-clinical trial patients): Elicia Lamp, RN        Tel. [phone]       Fax. [phone]  Toll-free appointments: [phone]  Scheduling assistance: [phone]  After hours/weekends: [phone] (ask for adult hematology/oncology on-call)      History of Present Illness:   Patricia [name] is a 46 y.o. female with past medical history noted as above who presents for follow up Ph+ ALL.      Her social history is notable for being a Runner, broadcasting/film/video.  She was recently married. She lives with her husband.    Interim history  Since her last visit she underwent C2 hypercvad. She was admitted at [facility] with neutropenic fever and found to have a LLL pneumonia, treated with cefepime.    Patient reports she still has a significant cough and post nasal drip that triggers the cough. It can wake her and the cough can make her gag/vomit. She does feel that it has been improving day-day. No further fevers. No shortness of breath.    Her pain has been decreased of late. She has not been using the voltaren gel because the pain has been less. The lidocaine patches were not helpful.      Otherwise, she denies new constitutional symptoms such as anorexia, weight loss, night sweats or unexplained fevers. Furthermore, she denies symptoms of marrow failure: unexplained bleeding or bruising, recurrent or unexplained intercurrent infections, dyspnea on exertion, lightheadedness, palpitations or chest pain.  There have been no new or unexplained pains or self-identified masses, swelling or enlarged lymph nodes.    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 The majority of the abnormal cells (64/88) contained an additional BCR/ABL1 fusion signal, while 9/88 abnormal cells contained an additional ABL1 and ASS1 signal.       Genetics:  Karyotype/FISH:   RESULTS   Date Value Ref Range Status   10/28/2021   Final    NOTE: This report reflects a combined study from a peripheral blood and a bone marrow core biopsy. Eleven cells from the peripheral blood and nine cells from the bone marrow core biopsy were analyzed. The BCR/ABL1 FISH analysis was performed on the peripheral blood.     Abnormal Karyotype:  47,XX,t(9;22)(q34;q11.2),+der(22)t(9;22)[18]/46,XX[2]    Abnormal FISH:  A BCR/ABL1 interphase FISH assay showed a signal pattern consistent with a BCR::ABL1 rearrangement and the 9;22 translocation in 88% of the 100 cells scored. The majority of the abnormal cells (64/88) contained an additional BCR/ABL1 fusion signal, while 9/88 abnormal cells contained an additional ABL1 and ASS1 signal.           Pertinent Phenotypic data:  CD19 98%  CD20 on diagnosis 51%  CD22 98%           B-cell acute lymphoblastic leukemia (ALL) (CMS-HCC)   10/29/2021 Initial Diagnosis    B-cell acute lymphoblastic leukemia (ALL) (CMS-HCC)       10/30/2021 -  Chemotherapy    IP/OP LEUKEMIA GRAAPH-2005 < 60 YO (OP PEGFILGRASTIM ON DAY 7)  [No description for this plan]       01/06/2022 Endocrine/Hormone Therapy    OP LEUPROLIDE (LUPRON) 11.25 MG EVERY 3 MONTHS  Plan Provider: [name], MD           Review of Systems:   ROS reviewed and negative except as noted in H and P     Allergies:  Allergies   Allergen Reactions    Erythromycin Hives    Sulfa (Sulfonamide Antibiotics) Anaphylaxis    Azithromycin        Medications:     Current Outpatient Medications:     acetaminophen (TYLENOL 8 HOUR) 650 MG CR tablet, Take 1 tablet (650 mg total) by mouth every eight (8) hours as needed for pain., Disp: , Rfl:     cetirizine (ZYRTEC) 10 MG tablet, Take 1 tablet (10 mg total) by mouth nightly., Disp: 30 tablet, Rfl: 2    dapsone 100 MG tablet, Take 1 tablet (100 mg total) by mouth daily., Disp: 30 tablet, Rfl: 2    diclofenac sodium (VOLTAREN) 1 % gel, Apply 2 grams topically four (4) times a day as needed., Disp: 100 g, Rfl: 0    prochlorperazine (COMPAZINE) 10 MG tablet, Take 1 tablet (10 mg total) by mouth every six (6) hours as needed for up to 14 days., Disp: 30 tablet, Rfl: 0    valACYclovir (VALTREX) 500 MG tablet, Take 1 tablet (500 mg total) by mouth daily., Disp: 90 tablet, Rfl: 3    dasatinib (SPRYCEL) 70 MG tablet, Take 1 tablet (70 mg total) by mouth daily., Disp: 30 tablet, Rfl: 7    guaiFENesin (MUCINEX) 600 mg 12 hr tablet, Take 1 tablet (600 mg total) by mouth two (2) times a day as needed for congestion., Disp: 60 tablet, Rfl: 0    levalbuterol (XOPENEX) 0.31 mg/3 mL nebulizer solution, Inhale 3 mL (0.31 mg total) by nebulization every four (4) hours as needed for wheezing (cough)., Disp: 45 mL, Rfl: 0    levoFLOXacin (LEVAQUIN) 750 MG tablet, Take 1 tablet (750 mg total) by mouth daily., Disp: , Rfl:     nifedipine 0.3% lidocaine 1.5% in petrolatum ointment, Apply 1 application. topically daily., Disp: 100 g, Rfl: 0  No current facility-administered medications for this visit.    Medical History:  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       Social History:  Social History     Social History Narrative    Not on file       Family History:  History reviewed. No pertinent family history.    Objective:   BP 121/79  - Pulse 108  - Temp 36.3 ??C (97.4 ??F) (Temporal)  - Resp 18  - Wt 80.4 kg (177 lb 4 oz)  - SpO2 94%  - BMI 27.72 kg/m??     Physical Exam:  General: Resting in no apparent distress, accompanied by husband  HEENT:  Clear sclera, conjunctiva, mask in place  CARDAC: RRR, no R,M,Gs, no pitting peripheral edema  RESP: nonlabored, bronchial congestion clears with cough, otherwise bilaterally CTA  GI: Soft, nontender, active bowel sounds, no hepatic or splenomegaly  NEURO: alert, 0x4, steady gait, no focal deficits  PSYCH: appropriate  DERM: no visible rashes, lesions  LINE: PORT - accessed, [facility]I      Test Results:  No results found for this or any previous visit (from the past 24 hour(s)).    11/29/21 BMBx:  Diagnosis   Bone marrow, right iliac, aspiration and biopsy  -  Hypercellular bone marrow (70%) with trilineage hematopoiesis and 1% blasts by manual aspirate differential  -   Flow cytometry MRD analysis cannot definitively exclude the possibility of residual disease (see Comment and separate report)    -  See linked reports for associated Ancillary Studies.        10/29/21 10:52 11/29/21 13:00   BCR/ABL1 p190 Transcripts/100,000 Cells 27,301 2

## 2021-12-30 NOTE — Unmapped (Unsigned)
OUTPATIENT ONCOLOGY PALLIATIVE CARE    Principal Diagnosis: Patricia [name] is a 46 y.o. female with ***, diagnosed in ***. Disease sites include ***.     Assessment/Plan:   1.   2.  3. Advanced Care Planning  Information Obtained at: {ACP VISIT ZOXW:96045}    Advance Care Planning     Participants: ***  Discussion/Action: ***    Health Care Decision Maker as of 12/30/2021    HCDM Based on State Law Default: Patricia [name] - [phone]                 # Controlled substances risk management.   - Patient {Blank single:19197::has a signed pain medication agreement with Outpatient Palliative Care, completed on ***, as per standard of care,does not have a signed pain medication agreement with our team}.   - [place] database was reviewed today and it {Blank single:19197::was,was not} appropriate.   - Urine drug screen {Blank single:19197::was,was not} performed at this visit. Findings: {Blank single:19197::not applicable,appropriate,inappropriate: ***,periodic screening has been appropriate,***}.   - Patient has received information about safe storage and administration of medications.   - Patient {Blank single:19197::has,has not} received a prescription for narcan; {Blank single:19197::is not applicable,has been educated on its use. Patient's caregiver {Blank single:19197::was,was not} present for this discussion}.       F/u: ***    ----------------------------------------  Referring Provider: ***  Oncology Team: ***  PCP: [name], MD      HPI: ***  Current cancer-directed therapy: ***    Symptom Review:  General: ***  Pain: ***  Fatigue: ***   Mobility: ***  Sleep: ***  Appetite: ***  Nausea: ***  Bowel function: ***  Dyspnea: ***  Secretions: ***  Mood: ***    Palliative Performance Scale: {Palliative Performance WGNFA:21308657}    [facility]  [place] Symptom Assessment System-revised    Please insert the number for each symptom bellow:  Symptoms Severity 0=Best & 10=Worst    Pain {Number:63709:::1}    Tiredness {Number:63709:::1}   Drowsiness {Number:63709:::1}   Nausea {Number:63709:::1}   Lack of Appetite {Number:63709:::1}   Shortness of Breath {Number:63709:::1}   Depression {Number:63709:::1}   Anxiety {Number:63709:::1}   Wellbeing {Number:63709:::1}   Other Problem: *** {Number:63709:::1}     For the following questions please circle the number between 1 and 7 that best applies to you.     How would you rate your overall quality of life during the past week? (1=Very Poor; 7=Excellent)  {Number:63710:::1}       Coping/Support Issues: ***    Goals of Care: ***    Social History: ***  Name of primary support:   Occupation:  Hobbies:  Current residence / distance from [facility]:    Advance Care Planning: ***  HCPOA:  Natural surrogate decision maker:  Living Will:  ACP note:     Objective     Opioid Risk Tool:    Female  Female    Family history of substance abuse      Alcohol  1  3    Illegal drugs  2  3    Rx drugs  4  4    Personal history of substance abuse      Alcohol  3  3    Illegal drugs  4  4    Rx drugs  5  5    Age between 16--45 years  1  1    History of preadolescent sexual abuse  3  0    Psychological disease      ADD, OCD, bipolar, schizophrenia  2  2    Depression  1  1       Total: ***  (<3 low risk, 4-7 moderate risk, >8 high risk)    Oncology History Overview Note   Referring/Local Oncologist:    Diagnosis:   10/29/2021  Bone marrow, left iliac, aspiration and biopsy  -  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 The majority of the abnormal cells (64/88) contained an additional BCR/ABL1 fusion signal, while 9/88 abnormal cells contained an additional ABL1 and ASS1 signal.       Genetics:   Karyotype/FISH:   RESULTS   Date Value Ref Range Status   10/28/2021   Final    NOTE: This report reflects a combined study from a peripheral blood and a bone marrow core biopsy. Eleven cells from the peripheral blood and nine cells from the bone marrow core biopsy were analyzed. The BCR/ABL1 FISH analysis was performed on the peripheral blood.     Abnormal Karyotype:  47,XX,t(9;22)(q34;q11.2),+der(22)t(9;22)[18]/46,XX[2]    Abnormal FISH:  A BCR/ABL1 interphase FISH assay showed a signal pattern consistent with a BCR::ABL1 rearrangement and the 9;22 translocation in 88% of the 100 cells scored. The majority of the abnormal cells (64/88) contained an additional BCR/ABL1 fusion signal, while 9/88 abnormal cells contained an additional ABL1 and ASS1 signal.           Pertinent Phenotypic data:  CD19 98%  CD20 on diagnosis 51%  CD22 98%           B-cell acute ??? albuterol HFA 90 mcg/actuation inhaler Inhale 2 puffs every six (6) hours as needed for wheezing or shortness of breath. 8 g 0   ??? cetirizine (ZYRTEC) 10 MG tablet Take 1 tablet (10 mg total) by mouth nightly. 30 tablet 2   ??? dapsone 100 MG tablet Take 1 tablet (100 mg total) by mouth daily. 30 tablet 2   ??? dasatinib (SPRYCEL) 70 MG tablet Take 1 tablet (70 mg  total) by mouth daily. 60 tablet 6   ??? diclofenac sodium (VOLTAREN) 1 % gel Apply 2 grams topically four (4) times a day as needed. 100 g 0   ??? fluconazole (DIFLUCAN) 200 MG tablet Take 1 tablet (200 mg total) by mouth daily. 30 tablet 0   ??? guaiFENesin (MUCINEX) 600 mg 12 hr tablet Take 1 tablet (600 mg total) by mouth two (2) times a day as needed for congestion. 60 tablet 0   ??? levalbuterol (XOPENEX) 0.31 mg/3 mL nebulizer solution Inhale 3 mL (0.31 mg total) by nebulization every four (4) hours as needed for wheezing (cough). 45 mL 0   ??? levoFLOXacin (LEVAQUIN) 500 MG tablet Take 1 tablet (500 mg total) by mouth daily. Begin on 5/22 after you complete your Levaquin 750 mg daily course. 30 tablet 0   ??? prochlorperazine (COMPAZINE) 10 MG tablet Take 1 tablet (10 mg total) by mouth every six (6) hours as needed for up to 14 days. 30 tablet 0   ??? valACYclovir (VALTREX) 500 MG tablet Take 1 tablet (500 mg total) by mouth daily. 90 tablet 3     No current facility-administered medications for this visit.       Allergies:   Allergies   Allergen Reactions   ??? Erythromycin Hives   ??? Sulfa (Sulfonamide Antibiotics) Anaphylaxis   ??? Azithromycin        Family History:  Cancer-related family history is not on file.  has no family status information on file.       REVIEW OF SYSTEMS:  {ROS COMPLETE (# of SYSTEMS):[phone]}      PHYSICAL EXAM:   Vital signs for this encounter: VS reviewed in EPIC.  GEN: Awake and alert, pleasant appearing female in no acute distress  PSYCH: Alert and oriented to person, place and time. Euthymic.  HEENT: Pupils equally round without scleral icterus. No facial asymmetry.  CV: Regular rhythm with normal rate, no murmurs.  LUNGS: No increased work of breathing.  ABD: Soft and non-tender with no distention  SKIN: No rashes, petechiae or jaundice noted  EXT: No edema noted of the lower extremities  NEURO: Normal gait and coordination.    Lab Results   Component Value Date    CREATININE 0.57 (L) 12/30/2021     Lab Results   Component Value Date    ALKPHOS 125 (H) 12/30/2021    BILITOT 0.3 12/30/2021    BILIDIR 0.20 12/14/2021    PROT 7.2 12/30/2021    ALBUMIN 3.7 12/30/2021    ALT 28 12/30/2021    AST 24 12/30/2021            I personally spent *** minutes face-to-face and non-face-to-face in the care of this patient, which includes all pre, intra, and post visit time on the date of service.     {Blank single:19197::Cynthia Avie Arenas, MD,Julie Merlene Morse, MD PhD,Sean Ike Bene, MD,Kenzie Garner Nash, MD,Vineeta Smith Robert, PharmD CPP,Lauren Baumgardner, DO, HPM Fellow,Clarissa Durand-Rougely, MD, HPM Kerin Ransom, MD, HPM Edilia Bo, MD, HPM Fellow,***}  [facility]

## 2021-12-31 DIAGNOSIS — C9100 Acute lymphoblastic leukemia not having achieved remission: Principal | ICD-10-CM

## 2021-12-31 MED ORDER — ESCITALOPRAM 10 MG TABLET
ORAL_TABLET | Freq: Every day | ORAL | 0 refills | 90 days | Status: CP
Start: 2021-12-31 — End: ?

## 2021-12-31 NOTE — Unmapped (Signed)
Clinical Assessment Needed For: Dose Change  Medication: Sprycel  Last Fill Date/Day Supply: 11-30-21 / 30  Copay $0  Was previous dose already scheduled to fill: No    Notes to Pharmacist:

## 2021-12-31 NOTE — Unmapped (Signed)
Hello,    Please scheduled patient Patricia [name] on 01/10/2022 for a SCHED ADM THROUGH INF      ?? Reason for Admission: Scheduled chemotherapy HyperCVaD  ?? Primary Diagnosis:  Encounter for antineoplastic chemotherapy for ALL  ?? Primary Diagnosis ICD-10 Code:  Z51.11 & C91.00  ?? Expected length of stay: 3-5 Days   ?? CPT Code:  16109   ?? CPT Code Description: Under Injection and Intravenous Infusion Chemotherapy and Other Highly Complex Drug or Highly Complex Biologic Agent Administration   ?? Treating Attending: Dr. [name]  ?? Need for PICC placement in Infusion? No    Infusion Scheduling: Please notify the patient of the appointment date and time once scheduled. Please document this information within this encounter and then route to the navigator prior to closing the encounter.       Scheduled Admissions Assessment        Assessment  Response  Intervention    Type of insurance  Private/ACA/Employer  Financial counselor referral is not warranted this time.     Reliable Trasportation  Yes Referral for Social Work is not warranted this time.   Central Line Access  Yes Patient has line access and does not require an intervention at this time        Thank you,  Frederik Pear

## 2022-01-04 NOTE — Unmapped (Signed)
[facility] Navigation: Follow-Up  Completed with: Patient     Oncology Patient Navigator (OPN) provided follow-up call to review previously discussed resources/identified barriers and evaluate current needs.     Since previous call, OPN investigated status of caregiver counseling for patient's husband. He is not currently a patient at [facility], so they are attempting to setup counseling through his PCP. OPN encouraged patient to reach out if they change their mind. She denied any additional needs at this time and verbalized understanding of how to contact OPN if needs arise in the future. No additional follow-up scheduled.    Please message our program through [facility] Oncology Navigation) as appropriate/needed.

## 2022-01-06 ENCOUNTER — Ambulatory Visit
Admit: 2022-01-06 | Discharge: 2022-01-06 | Payer: PRIVATE HEALTH INSURANCE | Attending: Adult Health | Primary: Adult Health

## 2022-01-06 ENCOUNTER — Institutional Professional Consult (permissible substitution): Admit: 2022-01-06 | Discharge: 2022-01-06 | Payer: PRIVATE HEALTH INSURANCE

## 2022-01-06 ENCOUNTER — Ambulatory Visit: Admit: 2022-01-06 | Discharge: 2022-01-06 | Payer: PRIVATE HEALTH INSURANCE

## 2022-01-06 DIAGNOSIS — C9100 Acute lymphoblastic leukemia not having achieved remission: Principal | ICD-10-CM

## 2022-01-06 LAB — CBC W/ AUTO DIFF
BASOPHILS ABSOLUTE COUNT: 0.1 10*9/L (ref 0.0–0.1)
BASOPHILS RELATIVE PERCENT: 1 %
EOSINOPHILS ABSOLUTE COUNT: 0 10*9/L (ref 0.0–0.5)
EOSINOPHILS RELATIVE PERCENT: 0 %
HEMATOCRIT: 32.2 % — ABNORMAL LOW (ref 34.0–44.0)
HEMOGLOBIN: 10.9 g/dL — ABNORMAL LOW (ref 11.3–14.9)
LYMPHOCYTES ABSOLUTE COUNT: 0.6 10*9/L — ABNORMAL LOW (ref 1.1–3.6)
LYMPHOCYTES RELATIVE PERCENT: 6.9 %
MEAN CORPUSCULAR HEMOGLOBIN CONC: 33.9 g/dL (ref 32.0–36.0)
MEAN CORPUSCULAR HEMOGLOBIN: 29.2 pg (ref 25.9–32.4)
MEAN CORPUSCULAR VOLUME: 86.2 fL (ref 77.6–95.7)
MEAN PLATELET VOLUME: 8.1 fL (ref 6.8–10.7)
MONOCYTES ABSOLUTE COUNT: 1.1 10*9/L — ABNORMAL HIGH (ref 0.3–0.8)
MONOCYTES RELATIVE PERCENT: 12.5 %
NEUTROPHILS ABSOLUTE COUNT: 6.8 10*9/L (ref 1.8–7.8)
NEUTROPHILS RELATIVE PERCENT: 79.6 %
PLATELET COUNT: 287 10*9/L (ref 150–450)
RED BLOOD CELL COUNT: 3.74 10*12/L — ABNORMAL LOW (ref 3.95–5.13)
RED CELL DISTRIBUTION WIDTH: 17.3 % — ABNORMAL HIGH (ref 12.2–15.2)
WBC ADJUSTED: 8.5 10*9/L (ref 3.6–11.2)

## 2022-01-06 LAB — COMPREHENSIVE METABOLIC PANEL
ALBUMIN: 3.7 g/dL (ref 3.4–5.0)
ALKALINE PHOSPHATASE: 78 U/L (ref 46–116)
ALT (SGPT): 29 U/L (ref 10–49)
ANION GAP: 7 mmol/L (ref 5–14)
AST (SGOT): 25 U/L (ref <=34)
BILIRUBIN TOTAL: 0.6 mg/dL (ref 0.3–1.2)
BLOOD UREA NITROGEN: 14 mg/dL (ref 9–23)
BUN / CREAT RATIO: 12
CALCIUM: 9.4 mg/dL (ref 8.7–10.4)
CHLORIDE: 107 mmol/L (ref 98–107)
CO2: 27 mmol/L (ref 20.0–31.0)
CREATININE: 1.18 mg/dL — ABNORMAL HIGH
EGFR CKD-EPI (2021) FEMALE: 58 mL/min/{1.73_m2} — ABNORMAL LOW (ref >=60)
GLUCOSE RANDOM: 109 mg/dL (ref 70–179)
POTASSIUM: 4 mmol/L (ref 3.4–4.8)
PROTEIN TOTAL: 7.1 g/dL (ref 5.7–8.2)
SODIUM: 141 mmol/L (ref 135–145)

## 2022-01-06 LAB — SLIDE REVIEW

## 2022-01-06 MED ORDER — NIFED 0.3%/LIDOCA 1.5% W/PETRO
Freq: Every day | TOPICAL | 0 refills | 0 days | Status: CP
Start: 2022-01-06 — End: ?
  Filled 2022-01-06: qty 100, 30d supply, fill #0

## 2022-01-06 MED ADMIN — heparin, porcine (PF) 100 unit/mL injection 500 Units: 500 [IU] | INTRAVENOUS | @ 14:00:00 | Stop: 2022-01-07

## 2022-01-06 MED ADMIN — leuprolide (LUPRON) injection 11.25 mg: 11.25 mg | INTRAMUSCULAR | @ 14:00:00 | Stop: 2022-01-06

## 2022-01-06 NOTE — Unmapped (Addendum)
You are ready for the next cycle.    At the end of this cycle, the bone marrow biopsy will be repeated. Our goal is for MRD (minimal residual disease) and BCR/ABL (even more sensitive) to be undetectable.

## 2022-01-06 NOTE — Unmapped (Signed)
Patient received Lupron in LUOQ at patient request.. Tolerated well. BandAid Applied. Patient education provided.

## 2022-01-06 NOTE — Unmapped (Signed)
Denies pain, steady gait.

## 2022-01-06 NOTE — Unmapped (Signed)
1610 Port accessed per protocol with brisk blood return noted. Wasted of 10 ml than labs drawn, flushed with 20 ml normal saline followed with 500 units heparin. Dressed in sterile fashion. Blood sent for analysis.    1058 Port  de-accessed per protocol. Band-aid applied after hemostasis.

## 2022-01-06 NOTE — Unmapped (Signed)
[facility] Leukemia Clinic Follow Up Visit Note     [name]  Patient Age: 46 y.o.  Encounter Date: 01/06/2022    Primary Care Provider:  [name], MD    Referring Physician:  [name], MD  [address]  [place],  [place] [address]    Assessment/Plan:  Patricia [name] is a 46 y.o. female with past medical history of chronic right shoulder and back pain, hypertension, anxiety, and recently diagnosed Ph+ ALL. S/p induction on GRAAPH-2005 bone marrow biopsy demonstrated morphologic CR, BCR/ABL p190 2/100,000.   MRD Interpretation, Bone Marrow    Comment: A small population of immature B cells is identified by flow cytometry that has an immunophenotypic profile consistent with normal B lymphoblasts (hematogones); however, the immunophenotype is not distinct enough from the patient's leukemia-associated immunophenotype to completely rule out residual disease. The suspicious population represents 0.05% of mononuclear cells.       Disease specific prognosis: It remains unclear whether or not Ph+ALL patients can be cured with chemotherapy alone plus TKI. R-Hyper-CVAD+dasatinib offers median DFS 31 mo and median OS 47 mo (Ravandi, Cancer 2015).     Prognosis is highly dependant upon achieving CMR. For patients who achieve CMR (neg by pcr) at 3 months, risk of relapse at 4 years is about 25% (Short, Blood 2016).     She is now s/p Cycle 2 R-HyperCVAD+ dasatinib. C3 delayed one week due to pneumonia. She is signifcantly improved and appropriate to proceed with C3. Will plan 1 IT per cycle due to intolerance of 2.    Ph+ALL, in remission: On GRAAPH 2005 (per Patricia [name], et al. Randomized study of reduced intensity chemotherapy combined with imatinib in adults with Ph-positive ALL. Blood 2015 Jun 11;125(24):3711-9) with dasatinib for imatinib. This is C1 high dose TKI+Dex+Vincristine to dose intensification with (R)-HyperCVAD/MA (starting with MA, I.e. Part B or Even cycles).  TKI is continued indefinitely with hopes for at least 2 years.  She has partial (51%) CD20 expression.  Therefore we will proceed with rituximab in addition to hyperCVAD.   -Plan Cycle 3 A cycle R-HyperCVAD/MA+dasatinib    - see coordination of care  - 1 IT per cycle - did not tolerate 2 per cycle  - Dasatinib 70 mg     - Next bone marrow biopsy following cycle 3 -requested, orders placed    Treatment Timeline:  10/29/2021: Bone marrow biopsy: Ph+ ALL, 51% expression of CD20  10/30/21: Cycle 1 day 1 GRAAPH-2005 induction  11/03/21: ITT #1   11/12/2021: IT #2  11/18/2021: IT #3  11/29/2021: Post cycle 1 bone marrow biopsy: Morphologic CR.  MRD by flow insufficient, p190 2/100,000  12/10/2021: Cycle 2 B cycle + rituximab  12/14/21: IT#4  12/17/21: IT#5  01/10/22: PLANC C3 A cycle + rituximab    Pancytopenia due to chemotherapy  - counts currently recovered  - supportive care as detailed below when in nadir    Pneumonia, LLL; hospitalized 5/24-29 -s/p cefepime  - recovering well with residual cough  - PRN levalbuterol inhaler or nebulizer  - PRN guaifenesin     Psychosocial distress: She reports a moderate level of anxiety regarding the management of the above.   - Counseling given   - Consider ref to comprehensive cancer support program     Patient-centered care/Shared decision-making:   We discussed the plan above at length. The patient and her husband actively contributed to the conversation. Specifically, her most important outcomes are:  Prolonging overall survival and Maintaining her overall functional activity     Supportive Care Needs: We recommend based on the patient???s underlying diagnosis and treatment history the following supportive care:    1. Antimicrobial prophylaxis:  ALL (in remission, receiving post-remission therapy): Bacterial: Levofloxacin 500mg  PO daily (when absolute neutrophils </= 0.5);   Fungal: Fluconazole 200mg  PO daily (when absolute neutrophils </= 0.5);   Viral: Valacyclovir 500mg  PO daily (Continuous);   PJP:  SMX/TMP DS PO BID twice weekly (Sat/Sun)    2. Blood product support:  Leukoreduced blood products are required.  Irradiated blood products are preferred, but in case of urgent transfusion needs non-irradiated blood products may be used: 2 units for Hg <= 7.0.     Coordination of care:   - Admission for C3 6/12  - scheduled twice week labs/transfusion support   - sched order for pegfilgrastim and D11 vincristine  - Inpatient team please prescribe D11-14 dex at discharge  - Requested bmbx prior to C4  - Requested clinic visit s/p bmbx and prior to C4      [name], AGPCNP-BC  Nurse Practitioner  Hematology/Oncology  [facility]      [name], MD was available  Leukemia Program  Division of Hematology  [facility]      Nurse Navigator (non-clinical trial patients): Elicia Lamp, RN        Tel. [phone]       Fax. [phone]  Toll-free appointments: [phone]  Scheduling assistance: [phone]  After hours/weekends: [phone] (ask for adult hematology/oncology on-call)      History of Present Illness:   Patricia [name] is a 46 y.o. female with past medical history noted as above who presents for follow up Ph+ ALL.      Her social history is notable for being a Runner, broadcasting/film/video.  She was recently married. She lives with her husband.    Interim history  Since her last visit her cough has improved significantly. She is using a nebulizer occasionally. No fevers. No SOB.    Reports chronic hemorrhoids and asked for refill of compounded med that works for her. Reports that her BMs are regular.    Reports some tingling in fingers. Occasinally feels a pulse into her fingers. When she sleeps on her back with her hands on her chest it is worse.    Her pain has been decreased of late. She has not been using the voltaren gel because the pain has been less. The lidocaine patches were not helpful.      Otherwise, she denies new constitutional symptoms such as anorexia, weight loss, night sweats or unexplained fevers.  Furthermore, she denies symptoms of marrow failure: unexplained bleeding or bruising, recurrent or unexplained intercurrent infections, dyspnea on exertion, lightheadedness, palpitations or chest pain.  There have been no new or unexplained pains or self-identified masses, swelling or enlarged lymph nodes.    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 The majority of the abnormal cells (64/88) contained an additional  BCR/ABL1 fusion signal, while 9/88 abnormal cells contained an additional ABL1 and ASS1 signal.       Genetics:   Karyotype/FISH:   RESULTS   Date Value Ref Range Status   10/28/2021   Final    NOTE: This report reflects a combined study from a peripheral blood and a bone marrow core biopsy. Eleven cells from the peripheral blood and nine cells from the bone marrow core biopsy were analyzed. The BCR/ABL1 FISH analysis was performed on the peripheral blood.     Abnormal Karyotype:  47,XX,t(9;22)(q34;q11.2),+der(22)t(9;22)[18]/46,XX[2]    Abnormal FISH:  A BCR/ABL1 interphase FISH assay showed a signal pattern consistent with a BCR::ABL1 rearrangement and the 9;22 translocation in 88% of the 100 cells scored. The majority of the abnormal cells (64/88) contained an additional BCR/ABL1 fusion signal, while 9/88 abnormal cells contained an additional ABL1 and ASS1 signal.           Pertinent Phenotypic data:  CD19 98%  CD20 on diagnosis 51%  CD22 98%           B-cell acute lymphoblastic leukemia (ALL) (CMS-HCC)   10/29/2021 Initial Diagnosis    B-cell acute lymphoblastic leukemia (ALL) (CMS-HCC)       10/30/2021 -  Chemotherapy    IP/OP LEUKEMIA GRAAPH-2005 < 60 YO (OP PEGFILGRASTIM ON DAY 7)  [No description for this plan]       01/06/2022 Endocrine/Hormone Therapy    OP LEUPROLIDE (LUPRON) 11.25 MG EVERY 3 MONTHS  Plan Provider: [name], MD           Review of Systems:   ROS reviewed and negative except as noted in H and P     Allergies:  Allergies   Allergen Reactions    Erythromycin Hives    Sulfa (Sulfonamide Antibiotics) Anaphylaxis    Azithromycin        Medications:     Current Outpatient Medications:     acetaminophen (TYLENOL 8 HOUR) 650 MG CR tablet, Take 1 tablet (650 mg total) by mouth every eight (8) hours as needed for pain., Disp: , Rfl:     cetirizine (ZYRTEC) 10 MG tablet, Take 1 tablet (10 mg total) by mouth nightly., Disp: 30 tablet, Rfl: 2    dapsone 100 MG tablet, Take 1 tablet (100 mg total) by mouth daily., Disp: 30 tablet, Rfl: 2    dasatinib (SPRYCEL) 70 MG tablet, Take 1 tablet (70 mg total) by mouth daily., Disp: 30 tablet, Rfl: 7    diclofenac sodium (VOLTAREN) 1 % gel, Apply 2 grams topically four (4) times a day as needed., Disp: 100 g, Rfl: 0    escitalopram oxalate (LEXAPRO) 10 MG tablet, Take 1 tablet (10 mg total) by mouth daily., Disp: 90 tablet, Rfl: 0    guaiFENesin (MUCINEX) 600 mg 12 hr tablet, Take 1 tablet (600 mg total) by mouth two (2) times a day as needed for congestion., Disp: 60 tablet, Rfl: 0    levalbuterol (XOPENEX) 0.31 mg/3 mL nebulizer solution, Inhale 3 mL (0.31 mg total) by nebulization every four (4) hours as needed for wheezing (cough)., Disp: 45 mL, Rfl: 0    nifedipine 0.3% lidocaine 1.5% in petrolatum ointment, Apply 1 application. topically daily., Disp: 100 g, Rfl: 0    prochlorperazine (COMPAZINE) 10 MG tablet, Take 1 tablet (10 mg total) by mouth every six (6) hours as needed for up to 14 days., Disp: 30 tablet, Rfl: 0    valACYclovir (VALTREX) 500 MG tablet, Take 1  tablet (500 mg total) by mouth daily., Disp: 90 tablet, Rfl: 3    Current Facility-Administered Medications:     leuprolide (LUPRON) 11.25 mg injection, , , ,     Medical History:  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       Social History:  Social History     Social History Narrative    Not on file       Family History:  No family history on file.    Objective:   BP 117/70  - Pulse 105  - Temp 37.1 ??C (98.7 ??F)  - Resp 18  - Wt 81 kg (178 lb 9.2 oz)  - SpO2 93%  - BMI 27.93 kg/m??     Physical Exam:  General: Resting in no apparent distress, accompanied by husband  HEENT:  Clear sclera, conjunctiva, mask in place  CARDAC: RRR, no R,M,Gs, no pitting peripheral edema  RESP: nonlabored, occasional cough, bilaterally CTA  GI: Soft, nontender, active bowel sounds, no hepatic or splenomegaly  NEURO: alert, 0x4, steady gait, no focal deficits  PSYCH: appropriate  DERM: no visible rashes, lesions  LINE: PORT - accessed, CDI      Test Results:  No results found for this or any previous visit (from the past 24 hour(s)).    01/06/2022 CBC/d and CMP reviewed with patient    11/29/21 BMBx:  Diagnosis   Bone marrow, right iliac, aspiration and biopsy  -  Hypercellular bone marrow (70%) with trilineage hematopoiesis and 1% blasts by manual aspirate differential  -   Flow cytometry MRD analysis cannot definitively exclude the possibility of residual disease (see Comment and separate report)    -  See linked reports for associated Ancillary Studies.        10/29/21 10:52 11/29/21 13:00   BCR/ABL1 p190 Transcripts/100,000 Cells 27,301 2

## 2022-01-06 NOTE — Unmapped (Signed)
Office Visit on 01/06/2022   Component Date Value Ref Range Status    WBC 01/06/2022 8.5  3.6 - 11.2 10*9/L Final    RBC 01/06/2022 3.74 (L)  3.95 - 5.13 10*12/L Final    HGB 01/06/2022 10.9 (L)  11.3 - 14.9 g/dL Final    HCT 16/05/9603 32.2 (L)  34.0 - 44.0 % Final    MCV 01/06/2022 86.2  77.6 - 95.7 fL Final    MCH 01/06/2022 29.2  25.9 - 32.4 pg Final    MCHC 01/06/2022 33.9  32.0 - 36.0 g/dL Final    RDW 54/04/8118 17.3 (H)  12.2 - 15.2 % Final    MPV 01/06/2022 8.1  6.8 - 10.7 fL Final    Platelet 01/06/2022 287  150 - 450 10*9/L Final    Neutrophils % 01/06/2022 79.6  % Final    Lymphocytes % 01/06/2022 6.9  % Final    Monocytes % 01/06/2022 12.5  % Final    Eosinophils % 01/06/2022 0.0  % Final    Basophils % 01/06/2022 1.0  % Final    Absolute Neutrophils 01/06/2022 6.8  1.8 - 7.8 10*9/L Final    Absolute Lymphocytes 01/06/2022 0.6 (L)  1.1 - 3.6 10*9/L Final    Absolute Monocytes 01/06/2022 1.1 (H)  0.3 - 0.8 10*9/L Final    Absolute Eosinophils 01/06/2022 0.0  0.0 - 0.5 10*9/L Final    Absolute Basophils 01/06/2022 0.1  0.0 - 0.1 10*9/L Final    Anisocytosis 01/06/2022 Slight (A)  Not Present Final

## 2022-01-10 ENCOUNTER — Encounter: Admit: 2022-01-10 | Discharge: 2022-01-15 | Payer: PRIVATE HEALTH INSURANCE

## 2022-01-10 ENCOUNTER — Ambulatory Visit: Admit: 2022-01-10 | Discharge: 2022-01-15 | Payer: PRIVATE HEALTH INSURANCE

## 2022-01-10 ENCOUNTER — Ambulatory Visit
Admit: 2022-01-10 | Discharge: 2022-01-15 | Disposition: A | Discharge status: Home / Self Care | Payer: PRIVATE HEALTH INSURANCE | Admitting: Hematology

## 2022-01-10 ENCOUNTER — Encounter
Admit: 2022-01-10 | Discharge: 2022-01-15 | Disposition: A | Discharge status: Home / Self Care | Payer: PRIVATE HEALTH INSURANCE | Admitting: Hematology

## 2022-01-10 LAB — URINALYSIS WITH MICROSCOPY
BACTERIA: NONE SEEN /HPF
BILIRUBIN UA: NEGATIVE
GLUCOSE UA: NEGATIVE
KETONES UA: NEGATIVE
LEUKOCYTE ESTERASE UA: NEGATIVE
NITRITE UA: NEGATIVE
PH UA: 6.5 (ref 5.0–9.0)
RBC UA: 60 /HPF — ABNORMAL HIGH (ref <=4)
SPECIFIC GRAVITY UA: 1.024 (ref 1.003–1.030)
SQUAMOUS EPITHELIAL: 6 /HPF — ABNORMAL HIGH (ref 0–5)
UROBILINOGEN UA: <2
WBC UA: 1 /HPF (ref 0–5)

## 2022-01-10 LAB — COMPREHENSIVE METABOLIC PANEL
ALBUMIN: 3.7 g/dL (ref 3.4–5.0)
ALKALINE PHOSPHATASE: 67 U/L (ref 46–116)
ALT (SGPT): 34 U/L (ref 10–49)
ANION GAP: 8 mmol/L (ref 5–14)
AST (SGOT): 32 U/L (ref <=34)
BILIRUBIN TOTAL: 0.6 mg/dL (ref 0.3–1.2)
BLOOD UREA NITROGEN: 16 mg/dL (ref 9–23)
BUN / CREAT RATIO: 31
CALCIUM: 8 mg/dL — ABNORMAL LOW (ref 8.7–10.4)
CHLORIDE: 106 mmol/L (ref 98–107)
CO2: 26 mmol/L (ref 20.0–31.0)
CREATININE: 0.52 mg/dL — ABNORMAL LOW
EGFR CKD-EPI (2021) FEMALE: >90 mL/min/{1.73_m2} (ref >=60)
GLUCOSE RANDOM: 172 mg/dL (ref 70–179)
POTASSIUM: 3.8 mmol/L (ref 3.4–4.8)
PROTEIN TOTAL: 6.4 g/dL (ref 5.7–8.2)
SODIUM: 140 mmol/L (ref 135–145)

## 2022-01-10 LAB — CBC W/ AUTO DIFF
BASOPHILS ABSOLUTE COUNT: 0 10*9/L (ref 0.0–0.1)
BASOPHILS RELATIVE PERCENT: 0.9 %
EOSINOPHILS ABSOLUTE COUNT: 0 10*9/L (ref 0.0–0.5)
EOSINOPHILS RELATIVE PERCENT: 0.1 %
HEMATOCRIT: 27.5 % — ABNORMAL LOW (ref 34.0–44.0)
HEMOGLOBIN: 9.4 g/dL — ABNORMAL LOW (ref 11.3–14.9)
LYMPHOCYTES ABSOLUTE COUNT: 0.9 10*9/L — ABNORMAL LOW (ref 1.1–3.6)
LYMPHOCYTES RELATIVE PERCENT: 20.4 %
MEAN CORPUSCULAR HEMOGLOBIN CONC: 34.2 g/dL (ref 32.0–36.0)
MEAN CORPUSCULAR HEMOGLOBIN: 29.7 pg (ref 25.9–32.4)
MEAN CORPUSCULAR VOLUME: 86.7 fL (ref 77.6–95.7)
MEAN PLATELET VOLUME: 7.7 fL (ref 6.8–10.7)
MONOCYTES ABSOLUTE COUNT: 0.7 10*9/L (ref 0.3–0.8)
MONOCYTES RELATIVE PERCENT: 16.3 %
NEUTROPHILS ABSOLUTE COUNT: 2.8 10*9/L (ref 1.8–7.8)
NEUTROPHILS RELATIVE PERCENT: 62.3 %
PLATELET COUNT: 258 10*9/L (ref 150–450)
RED BLOOD CELL COUNT: 3.17 10*12/L — ABNORMAL LOW (ref 3.95–5.13)
RED CELL DISTRIBUTION WIDTH: 17.8 % — ABNORMAL HIGH (ref 12.2–15.2)
WBC ADJUSTED: 4.5 10*9/L (ref 3.6–11.2)

## 2022-01-10 LAB — HCG QUANTITATIVE, BLOOD: GONADOTROPIN, CHORIONIC (HCG) QUANT: <2.6 m[IU]/mL

## 2022-01-10 MED ADMIN — sodium chloride (NS) 0.9 % infusion: 150 mL/h | INTRAVENOUS | @ 22:00:00 | Stop: 2022-01-10

## 2022-01-10 NOTE — Unmapped (Signed)
Hematology/Oncology     Attending Physician :  [name], MD  Accepting Service  : Oncology/Hematology (MDE)  Reason for Admission: C3 HyperCVAD (A)    Problem List:   Patient Active Problem List   Diagnosis    Leukocytosis    B-cell acute lymphoblastic leukemia (ALL) (CMS-HCC)    Acute cough    Dyspepsia    Acute frontal sinusitis        Assessment/Plan: [name] is an 46 y.o. female who was admitted for C3 Hyper-CVAD (A).    Ph+ALL, in remission: Initially diagnosed in 09/2021 and underwent [id number] induction complicated by PICC line associated superficial thrombus. BMBx on 5/2 revealed morphologic CR. Admitted for Cycle 3 HyperCVAD + Dasatinib. Patient has completed #5 IT chemos prior to admission, with plan to do one per cycle due to intolerance of two/cycle.  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)  - Dasatinib to 70 mg (last dose 6/12 at 0900)  - #6 IT MTX to be given on 6/13 in [place] - orders/chemo clarification placed   - Valtrex, dapsone ppx    cRegimen: R-HyperCVAD  Cycle # 3  Primary Oncologist: Senaida Ores    Odd Cycles  Date 6/12       Day 1 2 3 4 5    Rituximab 375 mg/m2 x       Doxorubicin 50 mg/m2    x    Vincristine 2 mg    x    Cyclophosphamide 300 mg/m2 Q12H X  x X  x X  x     Dexamethasone 40mg  x x x x    Methotrexate IT 12mg   x                - Daily urinalysis, monitor for hemorraghic cystitis  - Monitor for PN and constipation     Disposition ODD:   - Neulasta - need to be added to 6/19 lab appt  - Follow-up lab tests scheduled for 2x weekly starting 6/19  - D11 Vincristine appointment: scheduled for 6/23  - BMBx- scheduled for 6/26  - Follow-up with Senaida Ores on 6/29  - D11-14 Dex, RX on dishcarge     Grade 1 peripheral neuropathy: Paraesthesias present primarily in her finger pads, currently without medical interventions continue to monitor while on therapy. Not affecting ADLs.  - CTM       Anxiety: Reports taking Lexapro 10 mg at home.  - Continue lexapro     Acute on Chronic back pain, upper and lower back. Improved. Has not had to take oxycodone outside of needing to take after moving boxes on Saturday.  - APAP PRN     Headaches:  - PRN Esgisic     Resent History acute on chronic sinusitis: Completed augmentin 875-125mg  PO BID x 7(5/1-5/8). Symptoms improving but ongoing. She has been doing xopenex BID at home for cough. Takes zyrtec daily.  - Continue xopenex BID  - Continue zyrtec      Psychosocial distress: She reports a moderate level of anxiety regarding the management of the above.   - Counseling given   - Consider ref to comprehensive cancer support program      Cancer related fatigue:  Patient endorses fatigue with onset of cancer symptoms or treatment.      Immunocompromised status: Patient is immunocompromised secondary to disease or chemotherapy  -Antimicrobial prophylaxis as above     Impending Electrolyte Abnormality Secondary to Chemotherapy and/or IV Fluids  -Daily Electrolyte monitoring  -Replete per [facility] guidelines.  Impending Pancytopenia secondary to Acute Leukemia/chemotherapy:   - Transfuse 1 unit of pRBCs for hgb >8  - Transfuse 1 unit plt for plts >10K        HPI: Patricia [name] is an 46 y.o. female who presented for C3 Hyper-CVAD.    She states she has been doing well since last cycle.  She has had chronic sinusitis with acute illness recently but that has improved. She is doing xopenex BID for cough which does help.  She did a lot of heavy lifting this weekend moving boxes and she took oxycodone 10mg  once for back pain. She denies f/c/n/v/d/abd pain. She denies sore throat, rhinorrhea.  Good energy and appetite.    Review of Systems: All positive and pertinent negatives are noted in the HPI; otherwise all other systems are negative    Oncologic History:   Primary Oncologist: Senaida Ores  Oncology History Overview Note   Referring/Local Oncologist:    Diagnosis:   10/29/2021  Bone marrow, left iliac, aspiration and biopsy  -  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 The majority of the abnormal cells (64/88) contained an additional BCR/ABL1 fusion signal, while 9/88 abnormal cells contained an additional ABL1 and ASS1 signal.       Genetics:   Karyotype/FISH:   RESULTS   Date Value Ref Range Status   10/28/2021   Final    NOTE: This report reflects a combined study from a peripheral blood and a bone marrow core biopsy. Eleven cells from the peripheral blood and nine cells from the bone marrow core biopsy were analyzed. The BCR/ABL1 FISH analysis was performed on the peripheral blood.     Abnormal Karyotype:  47,XX,t(9;22)(q34;q11.2),+der(22)t(9;22)[18]/46,XX[2]    Abnormal FISH:  A BCR/ABL1 interphase FISH assay showed a signal pattern consistent with a BCR::ABL1 rearrangement and the 9;22 translocation in 88% of the 100 cells scored. The majority of the abnormal cells (64/88) contained an additional BCR/ABL1 fusion signal, while 9/88 abnormal cells contained an additional ABL1 and ASS1 signal.           Pertinent Phenotypic data:  CD19 98%  CD20 on diagnosis 51%  CD22 98%           B-cell acute lymphoblastic leukemia (ALL) (CMS-HCC)   10/29/2021 Initial Diagnosis    B-cell acute lymphoblastic leukemia (ALL) (CMS-HCC)       10/30/2021 -  Chemotherapy    IP/OP LEUKEMIA [id number] < 60 YO (OP PEGFILGRASTIM ON DAY 7)  [No description for this plan]       01/06/2022 Endocrine/Hormone Therapy    OP LEUPROLIDE (LUPRON) 11.25 MG EVERY 3 MONTHS  Plan Provider: [name], MD           Medical History:  PCP: [name], MD  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    Surgical History:  Past Surgical History:   Procedure Laterality Date    IR INSERT PORT AGE GREATER THAN 5 YRS  12/08/2021    IR INSERT PORT AGE GREATER THAN 5 YRS 12/08/2021 [name], PA IMG VIR [facility]      Social History:  Social History  Socioeconomic History    Marital status: Married   Tobacco Use    Smoking status: Never    Smokeless tobacco: Never     Social Determinants of Psychologist, prison and probation services Strain: Low Risk     Difficulty of Paying Living Expenses: Not hard at all   Food Insecurity: No Food Insecurity    Worried About Programme researcher, broadcasting/film/video in the Last Year: Never true    Barista in the Last Year: Never true   Transportation Needs: No Transportation Needs    Lack of Transportation (Medical): No    Lack of Transportation (Non-Medical): No      Family History:   family history is not on file.       Allergies: is allergic to erythromycin, sulfa (sulfonamide antibiotics), and azithromycin.    Medications:   Meds:   [START ON 01/11/2022] cetirizine  10 mg Oral daily    [START ON 01/11/2022] dapsone  100 mg Oral Daily    [START ON 01/11/2022] escitalopram oxalate  10 mg Oral Daily    levalbuterol  1 ampule Nebulization BID    sodium chloride  10 mL Intravenous BID    sodium chloride  10 mL Intravenous BID    [START ON 01/11/2022] valACYclovir  500 mg Oral Daily     Continuous Infusions:   sodium chloride       PRN Meds:.emollient combination no.92, loperamide, loperamide    Objective:   Vitals: Temp:  [37.2 ??C (99 ??F)] 37.2 ??C (99 ??F)  Heart Rate:  [109] 109  Resp:  [18] 18  BP: (119)/(64) 119/64    Physical Exam:  General: Resting, in no apparent distress, lying in bed  HEENT:  No scleral icterus or conjunctival injection. MMM without ulceration, erythema or exudate. No cervical or axillary lymphadenopathy.   Heart:  RRR. S1, S2. No murmurs, gallops, or rubs.  Lungs:  Breathing is unlabored, and patient is speaking full sentences with ease.  No stridor.  CTAB. No rales, ronchi, or crackles.    Abdomen:  No distention or pain on palpation.  Bowel sounds are present and normoactive x 4.  No palpable hepatomegaly or splenomegaly.  No palpable masses.  Skin:  No rashes, petechiae or purpura.  No areas of skin breakdown. Warm to touch, dry, smooth, and even.  Musculoskeletal:  No grossly-evident joint effusions or deformities.  Range of motion about the shoulder, elbow, hips and knees is grossly normal.  Psychiatric:  Range of affect is appropriate.    Neurologic:  Alert with appropriate conversation. CNII-CNXII grossly intact.  Extremities:  Appear well-perfused. No clubbing, edema, or cyanosis.  CVAD: R CW DL Port - no erythema, nontender; dressing CDI.      Test Results  Recent Labs     01/10/22  1136   WBC 4.5   NEUTROABS 2.8   HGB 9.4*   PLT 258     Recent Labs     01/10/22  1136   NA 140   K 3.8   CL 106   CO2 26.0   BUN 16   CREATININE 0.52*   CALCIUM 8.0*       Imaging: Radiology studies were personally reviewed    DVT PPX Indicated: no,  FEN:  Discharge Plan:  - fluids: yes  - electrolytes: stable   - diet: regular     Need for PT: no  Anticipated Discharge: Their home    Code Status:  Full Code  Time spent on counseling/coordination of care: 1 Hour  Total time spent with patient: 30 Minutes    Cruzita Lederer, MSN, FNP-BC, [facility]  Nurse Practitioner  [facility] Hematology/Oncology

## 2022-01-11 LAB — CBC W/ AUTO DIFF
BASOPHILS ABSOLUTE COUNT: 0 10*9/L (ref 0.0–0.1)
BASOPHILS RELATIVE PERCENT: 0.5 %
EOSINOPHILS ABSOLUTE COUNT: 0 10*9/L (ref 0.0–0.5)
EOSINOPHILS RELATIVE PERCENT: 0 %
HEMATOCRIT: 26.8 % — ABNORMAL LOW (ref 34.0–44.0)
HEMOGLOBIN: 9.2 g/dL — ABNORMAL LOW (ref 11.3–14.9)
LYMPHOCYTES ABSOLUTE COUNT: 0.6 10*9/L — ABNORMAL LOW (ref 1.1–3.6)
LYMPHOCYTES RELATIVE PERCENT: 7.7 %
MEAN CORPUSCULAR HEMOGLOBIN CONC: 34.3 g/dL (ref 32.0–36.0)
MEAN CORPUSCULAR HEMOGLOBIN: 29.9 pg (ref 25.9–32.4)
MEAN CORPUSCULAR VOLUME: 87.2 fL (ref 77.6–95.7)
MEAN PLATELET VOLUME: 7.4 fL (ref 6.8–10.7)
MONOCYTES ABSOLUTE COUNT: 0.1 10*9/L — ABNORMAL LOW (ref 0.3–0.8)
MONOCYTES RELATIVE PERCENT: 1.7 %
NEUTROPHILS ABSOLUTE COUNT: 6.7 10*9/L (ref 1.8–7.8)
NEUTROPHILS RELATIVE PERCENT: 90.1 %
PLATELET COUNT: 261 10*9/L (ref 150–450)
RED BLOOD CELL COUNT: 3.08 10*12/L — ABNORMAL LOW (ref 3.95–5.13)
RED CELL DISTRIBUTION WIDTH: 18.4 % — ABNORMAL HIGH (ref 12.2–15.2)
WBC ADJUSTED: 7.4 10*9/L (ref 3.6–11.2)

## 2022-01-11 LAB — BASIC METABOLIC PANEL
ANION GAP: 8 mmol/L (ref 5–14)
BLOOD UREA NITROGEN: 14 mg/dL (ref 9–23)
BUN / CREAT RATIO: 25
CALCIUM: 8.9 mg/dL (ref 8.7–10.4)
CHLORIDE: 111 mmol/L — ABNORMAL HIGH (ref 98–107)
CO2: 23 mmol/L (ref 20.0–31.0)
CREATININE: 0.56 mg/dL — ABNORMAL LOW
EGFR CKD-EPI (2021) FEMALE: >90 mL/min/{1.73_m2} (ref >=60)
GLUCOSE RANDOM: 163 mg/dL (ref 70–179)
POTASSIUM: 4.4 mmol/L (ref 3.4–4.8)
SODIUM: 142 mmol/L (ref 135–145)

## 2022-01-11 LAB — URINALYSIS WITH MICROSCOPY
BACTERIA: NONE SEEN /HPF
BACTERIA: NONE SEEN /HPF
BILIRUBIN UA: NEGATIVE
BILIRUBIN UA: NEGATIVE
GLUCOSE UA: NEGATIVE
GLUCOSE UA: NEGATIVE
KETONES UA: 40 — AB
KETONES UA: 40 — AB
LEUKOCYTE ESTERASE UA: NEGATIVE
LEUKOCYTE ESTERASE UA: NEGATIVE
NITRITE UA: NEGATIVE
NITRITE UA: NEGATIVE
PH UA: 5.5 (ref 5.0–9.0)
PH UA: 6.5 (ref 5.0–9.0)
PROTEIN UA: NEGATIVE
PROTEIN UA: NEGATIVE
RBC UA: 69 /HPF — ABNORMAL HIGH (ref <=4)
RBC UA: 133 /HPF — ABNORMAL HIGH (ref <=4)
SPECIFIC GRAVITY UA: 1.011 (ref 1.003–1.030)
SPECIFIC GRAVITY UA: 1.02 (ref 1.003–1.030)
SQUAMOUS EPITHELIAL: 1 /HPF (ref 0–5)
SQUAMOUS EPITHELIAL: <1 /HPF (ref 0–5)
UROBILINOGEN UA: <2
UROBILINOGEN UA: <2
WBC UA: 1 /HPF (ref 0–5)
WBC UA: 1 /HPF (ref 0–5)

## 2022-01-11 LAB — HEMATOPATHOLOGY LEUKEMIA/LYMPHOMA FLOW CYTOMETRY, CSF
LYMPHS CSF: 63 %
MONO/MACROPHAGE CSF: 22.2 %
NEUTROPHILS, CSF: 14.8 %
NUCLEATED CELLS, CSF: 0 ul (ref 0–5)
NUMBER OF CELLS CSF: 27
RBC CSF: 110 ul — ABNORMAL HIGH

## 2022-01-11 LAB — HEPATIC FUNCTION PANEL
ALBUMIN: 3.5 g/dL (ref 3.4–5.0)
ALKALINE PHOSPHATASE: 65 U/L (ref 46–116)
ALT (SGPT): 36 U/L (ref 10–49)
AST (SGOT): 27 U/L (ref <=34)
BILIRUBIN DIRECT: 0.1 mg/dL (ref 0.00–0.30)
BILIRUBIN TOTAL: 0.5 mg/dL (ref 0.3–1.2)
PROTEIN TOTAL: 6.5 g/dL (ref 5.7–8.2)

## 2022-01-11 LAB — PHOSPHORUS: PHOSPHORUS: 2 mg/dL — ABNORMAL LOW (ref 2.4–5.1)

## 2022-01-11 LAB — MAGNESIUM: MAGNESIUM: 2 mg/dL (ref 1.6–2.6)

## 2022-01-11 MED ADMIN — cycloPHOSphamide (CYTOXAN) 585 mg in sodium chloride (NS) 0.9 % 250 mL IVPB: 300 mg/m2 | INTRAVENOUS | @ 14:00:00 | Stop: 2022-01-13

## 2022-01-11 MED ADMIN — sodium chloride (NS) 0.9 % flush 10 mL: 10 mL | INTRAVENOUS | @ 02:00:00

## 2022-01-11 MED ADMIN — dasatinib (SPRYCEL) tablet 70 mg: 70 mg | ORAL | @ 14:00:00

## 2022-01-11 MED ADMIN — valACYclovir (VALTREX) tablet 500 mg: 500 mg | ORAL | @ 14:00:00

## 2022-01-11 MED ADMIN — ondansetron (ZOFRAN) tablet 24 mg: 24 mg | ORAL | @ 01:00:00 | Stop: 2022-01-14

## 2022-01-11 MED ADMIN — sodium chloride (NS) 0.9 % flush 10 mL: 10 mL | INTRAVENOUS | @ 14:00:00

## 2022-01-11 MED ADMIN — sodium chloride (NS) 0.9 % infusion: 100 mL/h | INTRAVENOUS | @ 02:00:00 | Stop: 2022-01-13

## 2022-01-11 MED ADMIN — dexAMETHasone (DECADRON) tablet 40 mg: 40 mg | ORAL | @ 01:00:00 | Stop: 2022-01-14

## 2022-01-11 MED ADMIN — levalbuterol (XOPENEX) nebulizer solution 0.31 mg: 1 | RESPIRATORY_TRACT | @ 14:00:00

## 2022-01-11 MED ADMIN — lidocaine (XYLOCAINE) 10 mg/mL (1 %) injection 10 mL: 10 mL | @ 19:00:00 | Stop: 2022-01-11

## 2022-01-11 MED ADMIN — levalbuterol (XOPENEX) nebulizer solution 0.31 mg: 1 | RESPIRATORY_TRACT | @ 01:00:00

## 2022-01-11 MED ADMIN — oxyCODONE (ROXICODONE) immediate release tablet 5 mg: 5 mg | ORAL | @ 06:00:00 | Stop: 2022-01-11

## 2022-01-11 MED ADMIN — mesna (MESNEX) 1,170 mg in sodium chloride (NS) 0.9 % 1,000 mL IVPB: 600 mg/m2 | INTRAVENOUS | @ 02:00:00 | Stop: 2022-01-13

## 2022-01-11 MED ADMIN — escitalopram oxalate (LEXAPRO) tablet 10 mg: 10 mg | ORAL | @ 16:00:00

## 2022-01-11 MED ADMIN — sodium chloride (NS) 0.9 % infusion: 100 mL/h | INTRAVENOUS | @ 14:00:00 | Stop: 2022-01-13

## 2022-01-11 MED ADMIN — OLANZapine (ZyPREXA) tablet 10 mg: 10 mg | ORAL | @ 01:00:00 | Stop: 2022-01-14

## 2022-01-11 MED ADMIN — cycloPHOSphamide (CYTOXAN) 585 mg in sodium chloride (NS) 0.9 % 250 mL IVPB: 300 mg/m2 | INTRAVENOUS | @ 02:00:00 | Stop: 2022-01-13

## 2022-01-11 MED ADMIN — LORazepam (ATIVAN) injection 1 mg: 1 mg | INTRAVENOUS | @ 17:00:00 | Stop: 2022-01-11

## 2022-01-11 MED ADMIN — dapsone tablet 100 mg: 100 mg | ORAL | @ 16:00:00

## 2022-01-11 MED ADMIN — methotrexate (Preservative Free) 12 mg, hydrocortisone sod succ (Solu-CORTEF) 50 mg in sodium chloride (NS) 0.9 % 4.52 mL INTRATHECAL syringe: INTRATHECAL | @ 18:00:00 | Stop: 2022-01-11

## 2022-01-11 MED ADMIN — cetirizine (ZyrTEC) tablet 10 mg: 10 mg | ORAL | @ 16:00:00

## 2022-01-11 MED ADMIN — acetaminophen (TYLENOL) tablet 650 mg: 650 mg | ORAL | @ 06:00:00

## 2022-01-11 NOTE — Unmapped (Signed)
Day #1 of chemo continues. To fluor for IT chemo. Up in room with steady gait, No falls or injuries. Monitoring   Problem: Anemia (Chemotherapy Effects)  Goal: Anemia Symptom Improvement  Outcome: Ongoing - Unchanged  Intervention: Monitor and Manage Anemia  Recent Flowsheet Documentation  Taken 01/11/2022 1610 by Silvano Bilis, RN  Safety Interventions:   chemotherapeutic agent precautions   low bed   nonskid shoes/slippers when out of bed     Problem: Urinary Bleeding Risk or Actual (Chemotherapy Effects)  Goal: Absence of Hematuria  Outcome: Ongoing - Unchanged     Problem: Nausea and Vomiting (Chemotherapy Effects)  Goal: Fluid and Electrolyte Balance  Outcome: Ongoing - Unchanged  Intervention: Prevent and Manage Nausea and Vomiting  Recent Flowsheet Documentation  Taken 01/11/2022 1227 by Silvano Bilis, RN  Oral Care: teeth brushed     Problem: Neurotoxicity (Chemotherapy Effects)  Goal: Neurotoxicity Symptom Control  Outcome: Ongoing - Unchanged     Problem: Neutropenia (Chemotherapy Effects)  Goal: Absence of Infection  Outcome: Ongoing - Unchanged  Intervention: Prevent Infection and Maximize Resistance  Recent Flowsheet Documentation  Taken 01/11/2022 1227 by Silvano Bilis, RN  Oral Care: teeth brushed     Problem: Oral Mucositis (Chemotherapy Effects)  Goal: Improved Oral Mucous Membrane Integrity  Outcome: Ongoing - Unchanged  Intervention: Promote Oral Comfort and Health  Recent Flowsheet Documentation  Taken 01/11/2022 1227 by Silvano Bilis, RN  Oral Care: teeth brushed     Problem: Thrombocytopenia Bleeding Risk (Chemotherapy Effects)  Goal: Absence of Bleeding  Outcome: Ongoing - Unchanged     Problem: Adult Inpatient Plan of Care  Goal: Plan of Care Review  Outcome: Progressing  Goal: Patient-Specific Goal (Individualized)  Outcome: Ongoing - Unchanged  Goal: Absence of Hospital-Acquired Illness or Injury  Outcome: Ongoing - Unchanged  Intervention: Identify and Manage Fall Risk  Recent Flowsheet Documentation  Taken 01/11/2022 0722 by Silvano Bilis, RN  Safety Interventions:   chemotherapeutic agent precautions   low bed   nonskid shoes/slippers when out of bed  Goal: Optimal Comfort and Wellbeing  Outcome: Ongoing - Unchanged  Goal: Readiness for Transition of Care  Outcome: Ongoing - Unchanged  Goal: Rounds/Family Conference  Outcome: Ongoing - Unchanged

## 2022-01-11 NOTE — Unmapped (Signed)
Hematology/Oncology APP Progress Note:     Admit Date: 01/10/2022   Today's Date: 01/11/2022      Attending Physician :  [name], MD  Primary Oncologist: Senaida Ores  Reason for Admission: C3 Hyper-CVAD    Patient Active Problem List   Diagnosis    Leukocytosis    B-cell acute lymphoblastic leukemia (ALL) (CMS-HCC)    Acute cough    Dyspepsia    Acute frontal sinusitis     Assessment/Plan: Patricia [name] is an 46 y.o. female who was admitted for C3 Hyper-CVAD (A).     Summary of Today's Plan:  C3D2= 6/13 Hyper-CVAD. Valtrex, dapsone ppx. U/A with large blood, repeat ordered. IT MTX in [place] today, f/u hemepath. Oxycodone for joint pains. Continue xopenex for chronic cough, will consider CT chest.    Ph+ALL, in remission: Initially diagnosed in 09/2021 and underwent [id number] induction complicated by PICC line associated superficial thrombus. BMBx on 5/2 revealed morphologic CR. Admitted for Cycle 3 HyperCVAD + Dasatinib. Patient has completed #5 IT chemos prior to admission, with plan to do one per cycle due to intolerance of two/cycle.  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)  - Dasatinib to 70 mg (last dose 6/12 at 0900)  - #6 IT MTX to be given on 6/13 in [place] - orders/chemo clarification placed   - Valtrex, dapsone ppx     cRegimen: R-HyperCVAD  Cycle # 3  Primary Oncologist: Senaida Ores     Odd Cycles  Date 6/12           Day 1 2 3 4 5                Doxorubicin 50 mg/m2       x     Vincristine 2 mg       x     Cyclophosphamide 300 mg/m2 Q12H X  x X  x X  x       Dexamethasone 40mg  x x x x     Methotrexate IT 12mg    x                          - Daily urinalysis, monitor for hemorraghic cystitis  - Monitor for PN and constipation      Disposition ODD:   - Neulasta - need to be added to 6/19 lab appt  - Follow-up lab tests scheduled for 2x weekly starting 6/19  - D11 Vincristine appointment: scheduled for 6/23  - BMBx- scheduled for 6/26  - Follow-up with Senaida Ores on 6/29  - D11-14 Dex, RX on dishcarge      Grade 1 peripheral neuropathy: Paraesthesias present primarily in her finger pads, currently without medical interventions continue to monitor while on therapy. Not affecting ADLs.  - CTM       Anxiety: Reports taking Lexapro 10 mg at home.  - Continue lexapro     Acute on Chronic back and knee pain, upper and lower back. Improved. Has not had to take oxycodone outside of needing to take after moving boxes on Saturday. Patient with intermittent b/l knee pain, relieved with APAP and oxycodone 5mg   - Oxycodone 5mg  q4h PRN  - APAP PRN     Headaches:  - PRN Esgisic     Resent History acute on chronic sinusitis: Completed augmentin 875-125mg  PO BID x 7(5/1-5/8). Symptoms improving but ongoing. She has been doing xopenex BID at home for cough. Takes zyrtec daily.  - Continue xopenex BID  -  Continue zyrtec  [ ]  will consider CT chest      Psychosocial distress: She reports a moderate level of anxiety regarding the management of the above.   - Counseling given   - Consider ref to comprehensive cancer support program      Cancer related fatigue:  Patient endorses fatigue with onset of cancer symptoms or treatment.      Immunocompromised status: Patient is immunocompromised secondary to disease or chemotherapy  -Antimicrobial prophylaxis as above     Impending Electrolyte Abnormality Secondary to Chemotherapy and/or IV Fluids  -Daily Electrolyte monitoring  -Replete per [facility] guidelines.      Impending Pancytopenia secondary to Acute Leukemia/chemotherapy:   - Transfuse 1 unit of pRBCs for hgb >8  - Transfuse 1 unit plt for plts >10K     FEN: reg diet  Standing electrolyte replacement panel for potassium and magnesium    Prophy: ambulation    Code Status: Full Code      Subjective/24hr events: [name]. Does not have overt blood in urine and unclear if having some spotting. Denies SP tenderness. Ongoing sinus/cough.     Review of Systems:  12 point ROS otherwise negative except as above in the HPI.       Objective:  Temp:  [36.7 ??C (98.1 ??F)-37.4 ??C (99.3 ??F)] 37.1 ??C (98.8 ??F)  Heart Rate:  [89-103] 89  Resp:  [16-18] 16  BP: (102-116)/(69-77) 108/69  MAP (mmHg):  [80-88] 82  SpO2:  [94 %-97 %] 94 %    Intake/Output this shift:    Intake/Output Summary (Last 24 hours) at 01/11/2022 1137  Last data filed at 01/11/2022 1610  Gross per 24 hour   Intake --   Output 500 ml   Net -500 ml       Weight:  Wt Readings from Last 1 Encounters:   01/10/22 82.6 kg (182 lb 3.2 oz)       Weight change:     Physical Exam:  GENERAL: Resting in bed  HEENT:  anicteric sclera, clear conjunctiva, MMM, no oral lesions or oropharyngeal exudate  RESP: non-labored, bilaterally CTA  CARDIO: RRR, normal S1,S2; no RMGs  GI: abdomen is soft, round, active bowel sounds, nontender, no palpable masses  EXTREMITIES: well perfused, warm, no peripheral edema  NEURO: Alert with appropriate conversation, no focal deficits  PSYCH: appropriate  MSK: No grossly-evident joint effusions or deformities. Range of motion in shoulders, elbows, hips and knees is grossly normal.  DERM: warm, dry, no rashes or lesions    LINE: R chest wall DL port, no swelling, erythema, or discharge; dressing CDI      Lab Trends:   Recent Labs     01/10/22  1136 01/11/22  0527   WBC 4.5 7.4   NEUTROABS 2.8 6.7   HGB 9.4* 9.2*   HCT 27.5* 26.8*   PLT 258 261   CREATININE 0.52* 0.56*   BUN 16 14   BILITOT 0.6 0.5   AST 32 27   ALT 34 36   ALKPHOS 67 65   K 3.8 4.4   MG  --  2.0   CALCIUM 8.0* 8.9   NA 140 142   CL 106 111*   CO2 26.0 23.0         Imaging: no new imaging today    Medications:   Scheduled Meds:   cetirizine  10 mg Oral daily    cyclophosphamide (CYTOXAN) IVPB  300 mg/m2 (Treatment Plan Recorded) Intravenous Q12H  dapsone  100 mg Oral Daily    dasatinib  70 mg Oral Daily    dexAMETHasone  40 mg Oral Q24H    [START ON 01/13/2022] DOXOrubicin (ADRIAMYCIN) IVPB  50 mg/m2 (Treatment Plan Recorded) Intravenous Once    escitalopram oxalate  10 mg Oral Daily    levalbuterol  1 ampule Nebulization BID mesna (MESNEX) IVPB  600 mg/m2 (Treatment Plan Recorded) Intravenous Q24H    cytarabine/methotrexate/hydrocortisone INTRATHECAL (6 mL)   Intrathecal Once    OLANZapine  10 mg Oral Q24H    ondansetron  24 mg Oral Q24H    sodium chloride  10 mL Intravenous BID    sodium chloride  10 mL Intravenous BID    valACYclovir  500 mg Oral Daily    [START ON 01/13/2022] vinCRIStine (ONCOVIN) IVPB  2 mg Intravenous Once     Continuous Infusions:   Chemo Clarification Order      IP okay to treat      sodium chloride      sodium chloride 100 mL/hr (01/11/22 1014)    sodium chloride       PRN Meds:.acetaminophen, Chemo Clarification Order, diclofenac sodium, diphenhydrAMINE, emollient combination no.92, EPINEPHrine IM, famotidine (PEPCID) IV, IP okay to treat, loperamide, loperamide, LORazepam, meperidine, methylPREDNISolone sodium succinate (PF), oxyCODONE, prochlorperazine, prochlorperazine, sodium chloride, sodium chloride 0.9%      Cruzita Lederer, FNP-BC, [facility]  Nurse Practitioner  Hematology/Oncology  [phone]    01/11/22

## 2022-01-11 NOTE — Unmapped (Signed)
[name]UR[facility] - Operative Note     Neuroradiology Post-Procedure Note    Procedure Name: [place] Lumbar Puncture with intrathecal chemotherapy injection.    Pre-Op Diagnosis: Ph+ALL    Post-Op Diagnosis: Same as pre-operative diagnosis    Provider    Operator: Windell Norfolk    Time out: Prior to the procedure, a time out was performed with all team members present. During the time out, the patient, procedure and procedure site when applicable were verbally verified by the team members and Windell Norfolk.    Description of procedure: Successful lumbar puncture with intrathecal chemotherapy administration with fluoroscopic guidance. 6 mL of CSF was removed and sent to the lab. 6 mL of 12 mg methotrexate/ 50 mg hydrocortisone was instilled.    Sedation:None    Estimated Blood Loss: approximately 1 mL  Complications: None    See detailed procedure note with images in PACS [name]).    The patient tolerated the procedure well without incident or complication and left the room in stable condition.    Windell Norfolk  01/11/2022 2:26 PM

## 2022-01-12 LAB — CBC W/ AUTO DIFF
BASOPHILS ABSOLUTE COUNT: 0 10*9/L (ref 0.0–0.1)
BASOPHILS RELATIVE PERCENT: 0.2 %
EOSINOPHILS ABSOLUTE COUNT: 0 10*9/L (ref 0.0–0.5)
EOSINOPHILS RELATIVE PERCENT: 0 %
HEMATOCRIT: 23.6 % — ABNORMAL LOW (ref 34.0–44.0)
HEMOGLOBIN: 8 g/dL — ABNORMAL LOW (ref 11.3–14.9)
LYMPHOCYTES ABSOLUTE COUNT: 0.5 10*9/L — ABNORMAL LOW (ref 1.1–3.6)
LYMPHOCYTES RELATIVE PERCENT: 6.4 %
MEAN CORPUSCULAR HEMOGLOBIN CONC: 33.9 g/dL (ref 32.0–36.0)
MEAN CORPUSCULAR HEMOGLOBIN: 30 pg (ref 25.9–32.4)
MEAN CORPUSCULAR VOLUME: 88.3 fL (ref 77.6–95.7)
MEAN PLATELET VOLUME: 7.1 fL (ref 6.8–10.7)
MONOCYTES ABSOLUTE COUNT: 0.5 10*9/L (ref 0.3–0.8)
MONOCYTES RELATIVE PERCENT: 7.1 %
NEUTROPHILS ABSOLUTE COUNT: 6.6 10*9/L (ref 1.8–7.8)
NEUTROPHILS RELATIVE PERCENT: 86.3 %
PLATELET COUNT: 218 10*9/L (ref 150–450)
RED BLOOD CELL COUNT: 2.67 10*12/L — ABNORMAL LOW (ref 3.95–5.13)
RED CELL DISTRIBUTION WIDTH: 18.2 % — ABNORMAL HIGH (ref 12.2–15.2)
WBC ADJUSTED: 7.7 10*9/L (ref 3.6–11.2)

## 2022-01-12 LAB — URINALYSIS WITH MICROSCOPY
BACTERIA: NONE SEEN /HPF
BILIRUBIN UA: NEGATIVE
GLUCOSE UA: NEGATIVE
KETONES UA: 40 — AB
LEUKOCYTE ESTERASE UA: NEGATIVE
NITRITE UA: NEGATIVE
PH UA: 6 (ref 5.0–9.0)
PROTEIN UA: NEGATIVE
RBC UA: 69 /HPF — ABNORMAL HIGH (ref <=4)
SPECIFIC GRAVITY UA: 1.015 (ref 1.003–1.030)
SQUAMOUS EPITHELIAL: <1 /HPF (ref 0–5)
UROBILINOGEN UA: <2
WBC UA: 1 /HPF (ref 0–5)

## 2022-01-12 LAB — MAGNESIUM: MAGNESIUM: 1.8 mg/dL (ref 1.6–2.6)

## 2022-01-12 LAB — BASIC METABOLIC PANEL
ANION GAP: 8 mmol/L (ref 5–14)
BLOOD UREA NITROGEN: 14 mg/dL (ref 9–23)
BUN / CREAT RATIO: 26
CALCIUM: 8.4 mg/dL — ABNORMAL LOW (ref 8.7–10.4)
CHLORIDE: 114 mmol/L — ABNORMAL HIGH (ref 98–107)
CO2: 21 mmol/L (ref 20.0–31.0)
CREATININE: 0.54 mg/dL — ABNORMAL LOW
EGFR CKD-EPI (2021) FEMALE: >90 mL/min/{1.73_m2} (ref >=60)
GLUCOSE RANDOM: 125 mg/dL (ref 70–179)
POTASSIUM: 3.9 mmol/L (ref 3.4–4.8)
SODIUM: 143 mmol/L (ref 135–145)

## 2022-01-12 LAB — PHOSPHORUS: PHOSPHORUS: 2.6 mg/dL (ref 2.4–5.1)

## 2022-01-12 MED ADMIN — dapsone tablet 100 mg: 100 mg | ORAL | @ 17:00:00

## 2022-01-12 MED ADMIN — sodium chloride (NS) 0.9 % flush 10 mL: 10 mL | INTRAVENOUS | @ 02:00:00

## 2022-01-12 MED ADMIN — OLANZapine (ZyPREXA) tablet 10 mg: 10 mg | ORAL | @ 02:00:00 | Stop: 2022-01-14

## 2022-01-12 MED ADMIN — mesna (MESNEX) 1,170 mg in sodium chloride (NS) 0.9 % 1,000 mL IVPB: 600 mg/m2 | INTRAVENOUS | @ 02:00:00 | Stop: 2022-01-13

## 2022-01-12 MED ADMIN — alteplase (ACTIVASE) injection small catheter clearance 1 mg: 1 mg | INTRAVENOUS | @ 22:00:00 | Stop: 2022-01-12

## 2022-01-12 MED ADMIN — dexAMETHasone (DECADRON) tablet 40 mg: 40 mg | ORAL | @ 02:00:00 | Stop: 2022-01-14

## 2022-01-12 MED ADMIN — cycloPHOSphamide (CYTOXAN) 585 mg in sodium chloride (NS) 0.9 % 250 mL IVPB: 300 mg/m2 | INTRAVENOUS | @ 03:00:00 | Stop: 2022-01-13

## 2022-01-12 MED ADMIN — sodium chloride (NS) 0.9 % infusion: 100 mL/h | INTRAVENOUS | @ 02:00:00 | Stop: 2022-01-13

## 2022-01-12 MED ADMIN — cycloPHOSphamide (CYTOXAN) 585 mg in sodium chloride (NS) 0.9 % 250 mL IVPB: 300 mg/m2 | INTRAVENOUS | @ 19:00:00 | Stop: 2022-01-14

## 2022-01-12 MED ADMIN — valACYclovir (VALTREX) tablet 500 mg: 500 mg | ORAL | @ 13:00:00

## 2022-01-12 MED ADMIN — sodium chloride (NS) 0.9 % flush 10 mL: 10 mL | INTRAVENOUS | @ 13:00:00

## 2022-01-12 MED ADMIN — mesna (MESNEX) 1,176 mg in sodium chloride (NS) 0.9 % 1,000 mL IVPB: 600 mg/m2 | INTRAVENOUS | @ 19:00:00

## 2022-01-12 MED ADMIN — levalbuterol (XOPENEX) nebulizer solution 0.31 mg: 1 | RESPIRATORY_TRACT | @ 01:00:00

## 2022-01-12 MED ADMIN — cetirizine (ZyrTEC) tablet 10 mg: 10 mg | ORAL | @ 17:00:00

## 2022-01-12 MED ADMIN — escitalopram oxalate (LEXAPRO) tablet 10 mg: 10 mg | ORAL | @ 17:00:00

## 2022-01-12 MED ADMIN — ondansetron (ZOFRAN) tablet 24 mg: 24 mg | ORAL | @ 02:00:00 | Stop: 2022-01-14

## 2022-01-12 MED ADMIN — acetaminophen (TYLENOL) tablet 650 mg: 650 mg | ORAL | @ 02:00:00

## 2022-01-12 MED ADMIN — oxyCODONE (ROXICODONE) immediate release tablet 5 mg: 5 mg | ORAL | @ 02:00:00 | Stop: 2022-01-25

## 2022-01-12 MED ADMIN — levalbuterol (XOPENEX) nebulizer solution 0.31 mg: 1 | RESPIRATORY_TRACT | @ 13:00:00

## 2022-01-12 MED ADMIN — dasatinib (SPRYCEL) tablet 70 mg: 70 mg | ORAL | @ 21:00:00

## 2022-01-12 NOTE — Unmapped (Signed)
Hematology/Oncology APP Progress Note:     Admit Date: 01/10/2022   Today's Date: 01/12/2022      Attending Physician :  [name], MD  Primary Oncologist: Senaida Ores  Reason for Admission: C3 Hyper-CVAD    Patient Active Problem List   Diagnosis    Leukocytosis    B-cell acute lymphoblastic leukemia (ALL) (CMS-HCC)    Acute cough    Dyspepsia    Acute frontal sinusitis     Assessment/Plan: ASIANNA BRUNDAGE is an 46 y.o. female who was admitted for C3 Hyper-CVAD (A).     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 Valtrex, dapsone ppx. Repeat U/A with continued blood, asymptomatic, low c/f hem cystitis. IT MTX in [place] today, f/u hemepath. Oxycodone PRN for joint pains. Continue xopenex for chronic cough, will consider CT chest.    Ph+ALL, in remission: Initially diagnosed in 09/2021 and underwent [id number] induction complicated by PICC line associated superficial thrombus. BMBx on 5/2 revealed morphologic CR. Admitted for Cycle 3 HyperCVAD + Dasatinib. Patient has completed #5 IT chemos prior to admission, with plan to do one per cycle due to intolerance of two/cycle.  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)  - Dasatinib to 70 mg (last dose 6/12 at 0900)  - #6 IT MTX to be given on 6/13 in [place] - f/u hempath  - Valtrex, dapsone ppx     cRegimen: R-HyperCVAD  Cycle # 3  Primary Oncologist: Senaida Ores     Odd Cycles  Date 6/12           Day 1 2 3 4 5                Doxorubicin 50 mg/m2       x     Vincristine 2 mg       x     Cyclophosphamide 300 mg/m2 Q12H X  x X  x X  x       Dexamethasone 40mg  x x x x     Methotrexate IT 12mg    x                          - Daily urinalysis, monitor for hemorraghic cystitis  - Monitor for PN and constipation      Disposition ODD:   - Neulasta - need to be added to 6/19 lab appt  - Follow-up lab tests scheduled for 2x weekly starting 6/19  - D11 Vincristine appointment: scheduled for 6/23  - BMBx- scheduled for 6/26  - Follow-up with Senaida Ores on 6/29  - D11-14 Dex, RX on dishcarge      Grade 1 peripheral neuropathy: Paraesthesias present primarily in her finger pads, currently without medical interventions continue to monitor while on therapy. Not affecting ADLs.  - CTM       Anxiety: Reports taking Lexapro 10 mg at home.  - Continue lexapro     Acute on Chronic back and knee pain, upper and lower back. Improved. Has not had to take oxycodone outside of needing to take after moving boxes on Saturday. Patient with intermittent b/l knee pain, relieved with APAP and oxycodone 5mg   - Oxycodone 5mg  q4h PRN  - APAP PRN     Headaches:  - PRN Esgisic     Resent History acute on chronic sinusitis: Completed augmentin 875-125mg  PO BID x 7(5/1-5/8). Symptoms improving but ongoing for approx a month. She has been doing xopenex BID at home for cough. Takes zyrtec daily.  -  Continue xopenex BID  - Continue zyrtec      Psychosocial distress: She reports a moderate level of anxiety regarding the management of the above.   - Counseling given   - Consider ref to comprehensive cancer support program      Cancer related fatigue:  Patient endorses fatigue with onset of cancer symptoms or treatment.      Immunocompromised status: Patient is immunocompromised secondary to disease or chemotherapy  -Antimicrobial prophylaxis as above     Impending Electrolyte Abnormality Secondary to Chemotherapy and/or IV Fluids  -Daily Electrolyte monitoring  -Replete per [facility] guidelines.      Impending Pancytopenia secondary to Acute Leukemia/chemotherapy:   - Transfuse 1 unit of pRBCs for hgb >8  - Transfuse 1 unit plt for plts >10K     FEN: reg diet  Standing electrolyte replacement panel for potassium and magnesium    Prophy: ambulation    Code Status: Full Code      Subjective/24hr events: [name]. Does not have overt blood in urine and unclear if having some spotting. Denies SP tenderness. Ongoing sinus/cough although improving since onset a month ago.    Review of Systems:  12 point ROS otherwise negative except as above in the HPI. Objective:  Temp:  [36.9 ??C (98.4 ??F)-37.1 ??C (98.8 ??F)] 36.9 ??C (98.4 ??F)  Heart Rate:  [98-109] 98  Resp:  [16] 16  BP: (110-124)/(68-87) 120/87  MAP (mmHg):  [81-96] 96  SpO2:  [90 %-96 %] 90 %    Intake/Output this shift:    Intake/Output Summary (Last 24 hours) at 01/12/2022 1100  Last data filed at 01/12/2022 0845  Gross per 24 hour   Intake 2772 ml   Output 725 ml   Net 2047 ml       Weight:  Wt Readings from Last 1 Encounters:   01/10/22 82.6 kg (182 lb 3.2 oz)       Weight change:     Physical Exam:  GENERAL: Resting in bed  HEENT:  anicteric sclera, clear conjunctiva, MMM, no oral lesions or oropharyngeal exudate  RESP: non-labored, bilaterally CTA  CARDIO: RRR, normal S1,S2; no RMGs  GI: abdomen is soft, round, active bowel sounds, nontender, no palpable masses  EXTREMITIES: well perfused, warm, no peripheral edema  NEURO: Alert with appropriate conversation, no focal deficits  PSYCH: appropriate  MSK: No grossly-evident joint effusions or deformities. Range of motion in shoulders, elbows, hips and knees is grossly normal.  DERM: warm, dry, no rashes or lesions  LINE: R chest wall DL port, no swelling, erythema, or discharge; dressing CDI      Lab Trends:   Recent Labs     01/10/22  1136 01/11/22  0527 01/12/22  0148   WBC 4.5 7.4 7.7   NEUTROABS 2.8 6.7 6.6   HGB 9.4* 9.2* 8.0*   HCT 27.5* 26.8* 23.6*   PLT 258 261 218   CREATININE 0.52* 0.56* 0.54*   BUN 16 14 14    BILITOT 0.6 0.5  --    AST 32 27  --    ALT 34 36  --    ALKPHOS 67 65  --    K 3.8 4.4 3.9   MG  --  2.0 1.8   CALCIUM 8.0* 8.9 8.4*   NA 140 142 143   CL 106 111* 114*   CO2 26.0 23.0 21.0         Imaging: no new imaging today    Medications:   Scheduled  Meds:   cetirizine  10 mg Oral daily    cyclophosphamide (CYTOXAN) IVPB  300 mg/m2 (Treatment Plan Recorded) Intravenous Q12H    dapsone  100 mg Oral Daily    dasatinib  70 mg Oral Daily    dexAMETHasone  40 mg Oral Q24H    [START ON 01/13/2022] DOXOrubicin (ADRIAMYCIN) IVPB  50 mg/m2 (Treatment Plan Recorded) Intravenous Once    escitalopram oxalate  10 mg Oral Daily    levalbuterol  1 ampule Nebulization BID    LORazepam  1 mg Intravenous Once    mesna (MESNEX) IVPB  600 mg/m2 (Treatment Plan Recorded) Intravenous Q24H    OLANZapine  10 mg Oral Q24H    ondansetron  24 mg Oral Q24H    sodium chloride  10 mL Intravenous BID    sodium chloride  10 mL Intravenous BID    valACYclovir  500 mg Oral Daily    [START ON 01/13/2022] vinCRIStine (ONCOVIN) IVPB  2 mg Intravenous Once     Continuous Infusions:   Chemo Clarification Order      IP okay to treat      sodium chloride      sodium chloride 100 mL/hr (01/11/22 2207)    sodium chloride       PRN Meds:.acetaminophen, Chemo Clarification Order, diclofenac sodium, diphenhydrAMINE, emollient combination no.92, EPINEPHrine IM, famotidine (PEPCID) IV, IP okay to treat, loperamide, loperamide, meperidine, methylPREDNISolone sodium succinate (PF), oxyCODONE, prochlorperazine, prochlorperazine, sodium chloride, sodium chloride 0.9%      Cruzita Lederer, FNP-BC, [facility]  Nurse Practitioner  Hematology/Oncology  [phone]    01/12/22

## 2022-01-12 NOTE — Unmapped (Signed)
CVAD Liaison - Partial Occlusion Note    CVAD Liaison Nurse was consulted for partial occlusion of the single port lumen. At the bedside, pulsatile flushes were performed. Blood return was confirmed. Educated primary RN of push pause flushes.  Able to get blood return with arm above head and with arm relaxed. Needle in appropriate positioning. If blood return becomes sluggish, or no blood return recommend alteplase.     Thank you for this consult,  Mauri Reading RN, CVAD Liaison     Consult Time 30 minutes (min)

## 2022-01-12 NOTE — Unmapped (Signed)
Patient A&Ox4, VSS and afebrile. PRN meds given for pain x1 with moderate effect. Otherwise patient slept through most of shift with no other complaints. Chemo administered as ordered, blood return assessed before and after. Safe environment maintained, call bell within reach.     Problem: Anemia (Chemotherapy Effects)  Goal: Anemia Symptom Improvement  Outcome: Ongoing - Unchanged  Intervention: Monitor and Manage Anemia  Recent Flowsheet Documentation  Taken 01/12/2022 0200 by Jairo Ben, RN  Safety Interventions:   chemotherapeutic agent precautions   lighting adjusted for tasks/safety   low bed   nonskid shoes/slippers when out of bed  Taken 01/12/2022 0055 by Jairo Ben, RN  Safety Interventions:   chemotherapeutic agent precautions   lighting adjusted for tasks/safety   low bed   nonskid shoes/slippers when out of bed  Taken 01/11/2022 2200 by Jairo Ben, RN  Safety Interventions:   chemotherapeutic agent precautions   lighting adjusted for tasks/safety   low bed   nonskid shoes/slippers when out of bed  Taken 01/11/2022 2041 by Jairo Ben, RN  Safety Interventions:   chemotherapeutic agent precautions   family at bedside   lighting adjusted for tasks/safety   low bed   nonskid shoes/slippers when out of bed     Problem: Nausea and Vomiting (Chemotherapy Effects)  Goal: Fluid and Electrolyte Balance  Outcome: Ongoing - Unchanged     Problem: Neutropenia (Chemotherapy Effects)  Goal: Absence of Infection  Outcome: Ongoing - Unchanged  Intervention: Prevent Infection and Maximize Resistance  Recent Flowsheet Documentation  Taken 01/11/2022 2041 by Jairo Ben, RN  Infection Prevention: hand hygiene promoted     Problem: Oral Mucositis (Chemotherapy Effects)  Goal: Improved Oral Mucous Membrane Integrity  Outcome: Ongoing - Unchanged     Problem: Thrombocytopenia Bleeding Risk (Chemotherapy Effects)  Goal: Absence of Bleeding  Outcome: Ongoing - Unchanged     Problem: Adult Inpatient Plan of Care  Goal: Plan of Care Review  Outcome: Ongoing - Unchanged  Goal: Patient-Specific Goal (Individualized)  Outcome: Ongoing - Unchanged  Goal: Absence of Hospital-Acquired Illness or Injury  Outcome: Ongoing - Unchanged  Intervention: Identify and Manage Fall Risk  Recent Flowsheet Documentation  Taken 01/12/2022 0200 by Jairo Ben, RN  Safety Interventions:   chemotherapeutic agent precautions   lighting adjusted for tasks/safety   low bed   nonskid shoes/slippers when out of bed  Taken 01/12/2022 0055 by Jairo Ben, RN  Safety Interventions:   chemotherapeutic agent precautions   lighting adjusted for tasks/safety   low bed   nonskid shoes/slippers when out of bed  Taken 01/11/2022 2200 by Jairo Ben, RN  Safety Interventions:   chemotherapeutic agent precautions   lighting adjusted for tasks/safety   low bed   nonskid shoes/slippers when out of bed  Taken 01/11/2022 2041 by Jairo Ben, RN  Safety Interventions:   chemotherapeutic agent precautions   family at bedside   lighting adjusted for tasks/safety   low bed   nonskid shoes/slippers when out of bed  Intervention: Prevent and Manage VTE (Venous Thromboembolism) Risk  Recent Flowsheet Documentation  Taken 01/11/2022 2041 by Jairo Ben, RN  Activity Management: activity adjusted per tolerance  Intervention: Prevent Infection  Recent Flowsheet Documentation  Taken 01/11/2022 2041 by Jairo Ben, RN  Infection Prevention: hand hygiene promoted  Goal: Optimal Comfort and Wellbeing  Outcome: Ongoing - Unchanged  Goal: Readiness for Transition of Care  Outcome: Ongoing - Unchanged  Goal: Rounds/Family Conference  Outcome: Ongoing - Unchanged

## 2022-01-13 LAB — CBC W/ AUTO DIFF
BASOPHILS ABSOLUTE COUNT: 0 10*9/L (ref 0.0–0.1)
BASOPHILS RELATIVE PERCENT: 0.4 %
EOSINOPHILS ABSOLUTE COUNT: 0 10*9/L (ref 0.0–0.5)
EOSINOPHILS RELATIVE PERCENT: 0 %
HEMATOCRIT: 24.3 % — ABNORMAL LOW (ref 34.0–44.0)
HEMOGLOBIN: 8 g/dL — ABNORMAL LOW (ref 11.3–14.9)
LYMPHOCYTES ABSOLUTE COUNT: 1 10*9/L — ABNORMAL LOW (ref 1.1–3.6)
LYMPHOCYTES RELATIVE PERCENT: 8.4 %
MEAN CORPUSCULAR HEMOGLOBIN CONC: 32.8 g/dL (ref 32.0–36.0)
MEAN CORPUSCULAR HEMOGLOBIN: 29.9 pg (ref 25.9–32.4)
MEAN CORPUSCULAR VOLUME: 91.1 fL (ref 77.6–95.7)
MEAN PLATELET VOLUME: 7.2 fL (ref 6.8–10.7)
MONOCYTES ABSOLUTE COUNT: 0.6 10*9/L (ref 0.3–0.8)
MONOCYTES RELATIVE PERCENT: 4.9 %
NEUTROPHILS ABSOLUTE COUNT: 10 10*9/L — ABNORMAL HIGH (ref 1.8–7.8)
NEUTROPHILS RELATIVE PERCENT: 86.3 %
PLATELET COUNT: 243 10*9/L (ref 150–450)
RED BLOOD CELL COUNT: 2.67 10*12/L — ABNORMAL LOW (ref 3.95–5.13)
RED CELL DISTRIBUTION WIDTH: 19.5 % — ABNORMAL HIGH (ref 12.2–15.2)
WBC ADJUSTED: 11.6 10*9/L — ABNORMAL HIGH (ref 3.6–11.2)

## 2022-01-13 LAB — URINALYSIS WITH MICROSCOPY
BACTERIA: NONE SEEN /HPF
BILIRUBIN UA: NEGATIVE
GLUCOSE UA: NEGATIVE
KETONES UA: 40 — AB
LEUKOCYTE ESTERASE UA: NEGATIVE
NITRITE UA: NEGATIVE
PH UA: 5.5 (ref 5.0–9.0)
PROTEIN UA: NEGATIVE
RBC UA: 17 /HPF — ABNORMAL HIGH (ref <=4)
SPECIFIC GRAVITY UA: 1.009 (ref 1.003–1.030)
SQUAMOUS EPITHELIAL: <1 /HPF (ref 0–5)
UROBILINOGEN UA: <2
WBC UA: <1 /HPF (ref 0–5)

## 2022-01-13 LAB — COMPREHENSIVE METABOLIC PANEL
ALBUMIN: 3.2 g/dL — ABNORMAL LOW (ref 3.4–5.0)
ALKALINE PHOSPHATASE: 56 U/L (ref 46–116)
ALT (SGPT): 51 U/L — ABNORMAL HIGH (ref 10–49)
ANION GAP: 9 mmol/L (ref 5–14)
AST (SGOT): 36 U/L — ABNORMAL HIGH (ref <=34)
BILIRUBIN TOTAL: 0.4 mg/dL (ref 0.3–1.2)
BLOOD UREA NITROGEN: 16 mg/dL (ref 9–23)
BUN / CREAT RATIO: 31
CALCIUM: 7.7 mg/dL — ABNORMAL LOW (ref 8.7–10.4)
CHLORIDE: 117 mmol/L — ABNORMAL HIGH (ref 98–107)
CO2: 21 mmol/L (ref 20.0–31.0)
CREATININE: 0.52 mg/dL — ABNORMAL LOW
EGFR CKD-EPI (2021) FEMALE: >90 mL/min/{1.73_m2} (ref >=60)
GLUCOSE RANDOM: 71 mg/dL (ref 70–179)
POTASSIUM: 3.3 mmol/L — ABNORMAL LOW (ref 3.4–4.8)
PROTEIN TOTAL: 5.6 g/dL — ABNORMAL LOW (ref 5.7–8.2)
SODIUM: 147 mmol/L — ABNORMAL HIGH (ref 135–145)

## 2022-01-13 LAB — BASIC METABOLIC PANEL
ANION GAP: 9 mmol/L (ref 5–14)
BLOOD UREA NITROGEN: 16 mg/dL (ref 9–23)
BUN / CREAT RATIO: 31
CALCIUM: 7.7 mg/dL — ABNORMAL LOW (ref 8.7–10.4)
CHLORIDE: 117 mmol/L — ABNORMAL HIGH (ref 98–107)
CO2: 21 mmol/L (ref 20.0–31.0)
CREATININE: 0.52 mg/dL — ABNORMAL LOW
EGFR CKD-EPI (2021) FEMALE: >90 mL/min/{1.73_m2} (ref >=60)
GLUCOSE RANDOM: 71 mg/dL (ref 70–179)
POTASSIUM: 3.3 mmol/L — ABNORMAL LOW (ref 3.4–4.8)
SODIUM: 147 mmol/L — ABNORMAL HIGH (ref 135–145)

## 2022-01-13 LAB — PHOSPHORUS: PHOSPHORUS: 2.4 mg/dL (ref 2.4–5.1)

## 2022-01-13 LAB — MAGNESIUM: MAGNESIUM: 1.8 mg/dL (ref 1.6–2.6)

## 2022-01-13 MED ORDER — DAPSONE 100 MG TABLET
ORAL_TABLET | Freq: Every day | ORAL | 2 refills | 30 days | Status: CP
Start: 2022-01-13 — End: 2022-04-13
  Filled 2022-01-14: qty 30, 30d supply, fill #0

## 2022-01-13 MED ADMIN — sodium chloride (NS) 0.9 % flush 10 mL: 10 mL | INTRAVENOUS | @ 14:00:00

## 2022-01-13 MED ADMIN — cycloPHOSphamide (CYTOXAN) 585 mg in sodium chloride (NS) 0.9 % 250 mL IVPB: 300 mg/m2 | INTRAVENOUS | @ 07:00:00 | Stop: 2022-01-13

## 2022-01-13 MED ADMIN — dapsone tablet 100 mg: 100 mg | ORAL | @ 16:00:00

## 2022-01-13 MED ADMIN — levalbuterol (XOPENEX) nebulizer solution 0.31 mg: 1 | RESPIRATORY_TRACT | @ 14:00:00

## 2022-01-13 MED ADMIN — escitalopram oxalate (LEXAPRO) tablet 10 mg: 10 mg | ORAL | @ 16:00:00

## 2022-01-13 MED ADMIN — mesna (MESNEX) 2,352 mg in sodium chloride (NS) 0.9 % 1,000 mL IVPB: 1200 mg/m2 | INTRAVENOUS | @ 02:00:00 | Stop: 2022-01-15

## 2022-01-13 MED ADMIN — sodium chloride (NS) 0.9 % flush 10 mL: 10 mL | INTRAVENOUS | @ 01:00:00

## 2022-01-13 MED ADMIN — levalbuterol (XOPENEX) nebulizer solution 0.31 mg: 1 | RESPIRATORY_TRACT | @ 01:00:00

## 2022-01-13 MED ADMIN — cycloPHOSphamide (CYTOXAN) 585 mg in sodium chloride (NS) 0.9 % 250 mL IVPB: 300 mg/m2 | INTRAVENOUS | @ 20:00:00 | Stop: 2022-01-13

## 2022-01-13 MED ADMIN — OLANZapine (ZyPREXA) tablet 10 mg: 10 mg | ORAL | @ 07:00:00 | Stop: 2022-01-15

## 2022-01-13 MED ADMIN — ondansetron (ZOFRAN) tablet 24 mg: 24 mg | ORAL | @ 07:00:00 | Stop: 2022-01-15

## 2022-01-13 MED ADMIN — cetirizine (ZyrTEC) tablet 10 mg: 10 mg | ORAL | @ 16:00:00

## 2022-01-13 MED ADMIN — furosemide (LASIX) injection 20 mg: 20 mg | INTRAVENOUS | @ 16:00:00 | Stop: 2022-01-13

## 2022-01-13 MED ADMIN — valACYclovir (VALTREX) tablet 500 mg: 500 mg | ORAL | @ 14:00:00

## 2022-01-13 MED ADMIN — furosemide (LASIX) injection 20 mg: 20 mg | INTRAVENOUS | @ 05:00:00 | Stop: 2022-01-13

## 2022-01-13 MED ADMIN — dexAMETHasone (DECADRON) tablet 40 mg: 40 mg | ORAL | @ 07:00:00 | Stop: 2022-01-15

## 2022-01-13 MED ADMIN — dasatinib (SPRYCEL) tablet 70 mg: 70 mg | ORAL | @ 16:00:00

## 2022-01-13 NOTE — Unmapped (Signed)
Hematology/Oncology APP Progress Note:     Admit Date: 01/10/2022   Today's Date: 01/13/2022      Attending Physician :  [name], MD  Primary Oncologist: Senaida Ores  Reason for Admission: C3 Hyper-CVAD    Patient Active Problem List   Diagnosis    Leukocytosis    B-cell acute lymphoblastic leukemia (ALL) (CMS-HCC)    Acute cough    Dyspepsia    Acute frontal sinusitis     Assessment/Plan: Patricia [name] is an 46 y.o. female who was admitted for C3 Hyper-CVAD (A).     Summary of Today's Plan:  C3D4= 6/15 Hyper-CVAD. Valtrex, dapsone ppx. U/A with continued blood, asymptomatic, low c/f hem cystitis. FVO with 15lb weight gain, improved with lasix overnight, repeat today given continued O2 needs. Oxycodone PRN for joint pains. Continue xopenex for chronic cough, will consider CT chest.    Ph+ALL, in remission: Initially diagnosed in 09/2021 and underwent [id number] induction complicated by PICC line associated superficial thrombus. BMBx on 5/2 revealed morphologic CR. Admitted for Cycle 3 HyperCVAD + Dasatinib. Patient has completed #5 IT chemos prior to admission, with plan to do one per cycle due to intolerance of two/cycle. IT #6 completed on 6/13- negative for disease.  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)  - Dasatinib to 70 mg (last dose 6/12 at 0900)  - Valtrex, dapsone ppx     cRegimen: R-HyperCVAD  Cycle # 3  Primary Oncologist: Senaida Ores     Odd Cycles  Date 6/12           Day 1 2 3 4 5                Doxorubicin 50 mg/m2       x     Vincristine 2 mg       x     Cyclophosphamide 300 mg/m2 Q12H X  x X  x X  x       Dexamethasone 40mg  x x x x     Methotrexate IT 12mg    x                          - Daily urinalysis, monitor for hemorraghic cystitis  - Monitor for PN and constipation      Disposition ODD:   - Neulasta/Ritux: scheduled for 6/19  - Follow-up lab tests scheduled for 2x weekly starting 6/19  - D11 Vincristine appointment: scheduled for 6/23  - BMBx- scheduled for 6/26  - Follow-up with Senaida Ores on 6/29  - D11-14 Dex, RX on dishcarge      Grade 1 peripheral neuropathy: Paraesthesias present primarily in her finger pads, currently without medical interventions continue to monitor while on therapy. Not affecting ADLs.  - CTM       Anxiety: Reports taking Lexapro 10 mg at home.  - Continue lexapro     Acute on Chronic back and knee pain, upper and lower back. Improved. Has not had to take oxycodone outside of needing to take after moving boxes on Saturday. Patient with intermittent b/l knee pain, relieved with APAP and oxycodone 5mg   - Oxycodone 5mg  q4h PRN  - APAP PRN     Headaches:  - PRN Esgisic     Resent History acute on chronic sinusitis: Completed augmentin 875-125mg  PO BID x 7(5/1-5/8). Symptoms improving but ongoing for approx a month. She has been doing xopenex BID at home for cough. Takes zyrtec daily.  - Continue xopenex BID  -  Continue zyrtec      Psychosocial distress: She reports a moderate level of anxiety regarding the management of the above.   - Counseling given   - Consider ref to comprehensive cancer support program      Cancer related fatigue:  Patient endorses fatigue with onset of cancer symptoms or treatment.      Immunocompromised status: Patient is immunocompromised secondary to disease or chemotherapy  -Antimicrobial prophylaxis as above     Impending Electrolyte Abnormality Secondary to Chemotherapy and/or IV Fluids  -Daily Electrolyte monitoring  -Replete per [facility] guidelines.      Impending Pancytopenia secondary to Acute Leukemia/chemotherapy:   - Transfuse 1 unit of pRBCs for hgb >8  - Transfuse 1 unit plt for plts >10K     FEN: reg diet  Standing electrolyte replacement panel for potassium and magnesium    Prophy: ambulation    Code Status: Full Code      Subjective/24hr events: ON with SOB, 15lb weight gain while here, lasix given, patient feels much improved this AM, continues on 2L [place]. Denies CP, SOB.. Does not have overt blood in urine and no spotting. Denies SP tenderness. Ongoing sinus/cough although improving since onset a month ago.    Review of Systems:  12 point ROS otherwise negative except as above in the HPI.       Objective:  Temp:  [36.9 ??C (98.4 ??F)-37.3 ??C (99.1 ??F)] 36.9 ??C (98.4 ??F)  Heart Rate:  [88-110] 110  Resp:  [16-18] 18  BP: (120-146)/(83-98) 120/90  MAP (mmHg):  [95-111] 98  SpO2:  [90 %-96 %] 93 %  BMI (Calculated):  [29.69] 29.69    Intake/Output this shift:    Intake/Output Summary (Last 24 hours) at 01/13/2022 1252  Last data filed at 01/13/2022 1242  Gross per 24 hour   Intake 960 ml   Output 4600 ml   Net -3640 ml       Weight:  Wt Readings from Last 1 Encounters:   01/12/22 88.5 kg (195 lb 3.2 oz)       Weight change:     Physical Exam:  GENERAL: Resting in bed  HEENT:  anicteric sclera, clear conjunctiva, MMM, no oral lesions or oropharyngeal exudate  RESP: non-labored, bilaterally CTA  CARDIO: RRR, normal S1,S2; no RMGs  GI: abdomen is soft, round, active bowel sounds, nontender, no palpable masses  EXTREMITIES: well perfused, warm, no peripheral edema  NEURO: Alert with appropriate conversation, no focal deficits  PSYCH: appropriate  MSK: No grossly-evident joint effusions or deformities. Range of motion in shoulders, elbows, hips and knees is grossly normal.  DERM: warm, dry, no rashes or lesions  LINE: R chest wall DL port, no swelling, erythema, or discharge; dressing CDI      Lab Trends:   Recent Labs     01/11/22  0527 01/12/22  0148 01/13/22  0052   WBC 7.4 7.7 11.6*   NEUTROABS 6.7 6.6 10.0*   HGB 9.2* 8.0* 8.0*   HCT 26.8* 23.6* 24.3*   PLT 261 218 243   CREATININE 0.56* 0.54* 0.52* - 0.52*   BUN 14 14 16  - 16   BILITOT 0.5  --  0.4   AST 27  --  36*   ALT 36  --  51*   ALKPHOS 65  --  56   K 4.4 3.9 3.3* - 3.3*   MG 2.0 1.8 1.8   CALCIUM 8.9 8.4* 7.7* - 7.7*   NA 142 143 147* -  147*   CL 111* 114* 117* - 117*   CO2 23.0 21.0 21.0 - 21.0         Imaging: no new imaging today    Medications:   Scheduled Meds:   cetirizine  10 mg Oral daily cyclophosphamide (CYTOXAN) IVPB  300 mg/m2 (Treatment Plan Recorded) Intravenous Q12H    dapsone  100 mg Oral Daily    dasatinib  70 mg Oral Daily    dexAMETHasone  40 mg Oral Q24H    [START ON 01/14/2022] DOXOrubicin (ADRIAMYCIN) IVPB  50 mg/m2 (Treatment Plan Recorded) Intravenous Once    escitalopram oxalate  10 mg Oral Daily    levalbuterol  1 ampule Nebulization BID    LORazepam  1 mg Intravenous Once    mesna (MESNEX) IVPB  1,200 mg/m2 Intravenous Q24H    OLANZapine  10 mg Oral Q24H    ondansetron  24 mg Oral Q24H    sodium chloride  10 mL Intravenous BID    sodium chloride  10 mL Intravenous BID    valACYclovir  500 mg Oral Daily    [START ON 01/14/2022] vinCRIStine (ONCOVIN) IVPB  2 mg Intravenous Once     Continuous Infusions:   Chemo Clarification Order      Chemo Clarification Order      Chemo Clarification Order      IP okay to treat      sodium chloride      sodium chloride       PRN Meds:.acetaminophen, [COMPLETED] alteplase **AND** alteplase, Chemo Clarification Order, Chemo Clarification Order, Chemo Clarification Order, diclofenac sodium, diphenhydrAMINE, emollient combination no.92, EPINEPHrine IM, famotidine (PEPCID) IV, IP okay to treat, loperamide, loperamide, meperidine, methylPREDNISolone sodium succinate (PF), oxyCODONE, prochlorperazine, prochlorperazine, sodium chloride, sodium chloride 0.9%      Cruzita Lederer, FNP-BC, [facility]  Nurse Practitioner  Hematology/Oncology  [phone]    01/13/22

## 2022-01-13 NOTE — Unmapped (Addendum)
Patricia [name] is an 46 y.o. female who was admitted for C3 Hyper-CVAD (A). She tolerated chemotherapy well.  She continued to have blood in daily UA, however, without any symptoms. Chemo was continued. Patient did have FVO in setting of 15lb weight gain which required diuresis. She will discharge on levaquin, fluconazole, dapsone, valtrex ppx. She will be in touch with [facility] Shared services for delivery of additional dasatinib. She will return on 6/19 for OP rituximab and neulasta. She will have labs 2x weekly at [facility] starting 6/19. She will return on 6/23 for D11 VCR and take dexamethasone for 4 days starting 6/23. She will have a BMBx on 6/26 and see Dr Senaida Ores in follow-up on 6/29.       Ph+ALL, in remission: Initially diagnosed in 09/2021 and underwent [id number] induction complicated by PICC line associated superficial thrombus. BMBx on 5/2 revealed morphologic CR. Admitted for Cycle 3 HyperCVAD + Dasatinib. Patient has completed #5 IT chemos prior to admission, with plan to do one per cycle due to intolerance of two/cycle. IT #6 completed on 6/13- negative for disease.  D1=6/12 C3 HyperCVAD (A)     cRegimen: R-HyperCVAD  Cycle # 3  Primary Oncologist: Senaida Ores     Odd Cycles  Date 6/12           Day 1 2 3 4 5                  Doxorubicin 50 mg/m2       x     Vincristine 2 mg       x     Cyclophosphamide 300 mg/m2 Q12H X  x X  x X  x       Dexamethasone 40mg  x x x x     Methotrexate IT 12mg    x                              Grade 1 peripheral neuropathy: Paraesthesias present primarily in her finger pads, currently without medical interventions continue to monitor while on therapy. Not affecting ADLs.     Anxiety: Reports taking Lexapro 10 mg at home.     Acute on Chronic back and knee pain, upper and lower back. Improved. Has not had to take oxycodone outside of needing to take after moving boxes on Saturday. Patient with intermittent b/l knee pain, relieved with APAP and oxycodone 5mg . Continue PRN. Headaches:  - PRN Esgisic     Resent History acute on chronic sinusitis: Completed augmentin 875-125mg  PO BID x 7(5/1-5/8). Symptoms improving but ongoing for approx a month. She has been doing xopenex BID at home for cough. Takes zyrtec daily. Continue home regimen.      Psychosocial distress: She reports a moderate level of anxiety regarding the management of the above.   - Counseling given   - Consider ref to comprehensive cancer support program

## 2022-01-13 NOTE — Unmapped (Signed)
Pt tolerating chemo , urine output decreased this am , large amt blood in urine Providers aware , increased mesna dosing with an additional bag of mesna started as ordered. Cytoxan 585mg  given as ordered with chemo clarification order from providers. Chemo rescheduled for pm shift. Pt aware of issues , notes she has hx of kidney stones, but she has no symptoms of presently. No peripheral neuropathy noted per pt report. Pt medial port lumen flushes without blood return , CVAD team consulter, got blood return at 1600 but when re checked at 1700, no blood return so TPA 1mg  instilled in lumen and marked ,will wait 2 hrs to re check for blood return. Will continue to monitor.   Problem: Anemia (Chemotherapy Effects)  Goal: Anemia Symptom Improvement  Outcome: Progressing  Intervention: Monitor and Manage Anemia  Recent Flowsheet Documentation  Taken 01/12/2022 0859 by Cheral Marker, RN  Safety Interventions: low bed     Problem: Urinary Bleeding Risk or Actual (Chemotherapy Effects)  Goal: Absence of Hematuria  Outcome: Progressing     Problem: Nausea and Vomiting (Chemotherapy Effects)  Goal: Fluid and Electrolyte Balance  Outcome: Progressing     Problem: Neurotoxicity (Chemotherapy Effects)  Goal: Neurotoxicity Symptom Control  Outcome: Progressing     Problem: Neutropenia (Chemotherapy Effects)  Goal: Absence of Infection  Outcome: Progressing  Intervention: Prevent Infection and Maximize Resistance  Recent Flowsheet Documentation  Taken 01/12/2022 0859 by Cheral Marker, RN  Infection Prevention: hand hygiene promoted     Problem: Oral Mucositis (Chemotherapy Effects)  Goal: Improved Oral Mucous Membrane Integrity  Outcome: Progressing  Intervention: Promote Oral Comfort and Health  Recent Flowsheet Documentation  Taken 01/12/2022 0845 by Cheral Marker, RN  Oral Mucous Membrane Protection: nonirritating oral fluids promoted     Problem: Thrombocytopenia Bleeding Risk (Chemotherapy Effects)  Goal: Absence of Bleeding  Outcome: Progressing     Problem: Adult Inpatient Plan of Care  Goal: Plan of Care Review  Outcome: Progressing  Goal: Patient-Specific Goal (Individualized)  Outcome: Progressing  Goal: Absence of Hospital-Acquired Illness or Injury  Outcome: Progressing  Intervention: Identify and Manage Fall Risk  Recent Flowsheet Documentation  Taken 01/12/2022 0859 by Cheral Marker, RN  Safety Interventions: low bed  Intervention: Prevent Infection  Recent Flowsheet Documentation  Taken 01/12/2022 0859 by Cheral Marker, RN  Infection Prevention: hand hygiene promoted  Goal: Optimal Comfort and Wellbeing  Outcome: Progressing  Goal: Readiness for Transition of Care  Outcome: Progressing  Goal: Rounds/Family Conference  Outcome: Progressing

## 2022-01-13 NOTE — Unmapped (Signed)
Patient A&Ox4, VSS and afebrile. Denies pain/nausea/vomiting. C/o discomfort/fullness and SOB overnight, pulse ox 86-90%. 3L [place] applied, pulse ox 93%. MD notified and at bedside to assess x2, abdominal and chest xray ordered, lasix ordered. Patient having good UOP after lasix and stated she is feeling better, tapering off of oxygen - on 1L [place]. Chemo administered as ordered, blood return assessed before and after. Safe environment maintained, call bell within reach.     Problem: Anemia (Chemotherapy Effects)  Goal: Anemia Symptom Improvement  Outcome: Ongoing - Unchanged  Intervention: Monitor and Manage Anemia  Recent Flowsheet Documentation  Taken 01/13/2022 0051 by Jairo Ben, RN  Safety Interventions:   chemotherapeutic agent precautions   lighting adjusted for tasks/safety   low bed   nonskid shoes/slippers when out of bed  Taken 01/12/2022 2213 by Jairo Ben, RN  Safety Interventions:   chemotherapeutic agent precautions   lighting adjusted for tasks/safety   low bed   nonskid shoes/slippers when out of bed  Taken 01/12/2022 2031 by Jairo Ben, RN  Safety Interventions:   chemotherapeutic agent precautions   lighting adjusted for tasks/safety   low bed   nonskid shoes/slippers when out of bed     Problem: Urinary Bleeding Risk or Actual (Chemotherapy Effects)  Goal: Absence of Hematuria  Outcome: Ongoing - Unchanged     Problem: Nausea and Vomiting (Chemotherapy Effects)  Goal: Fluid and Electrolyte Balance  Outcome: Ongoing - Unchanged     Problem: Neutropenia (Chemotherapy Effects)  Goal: Absence of Infection  Outcome: Ongoing - Unchanged  Intervention: Prevent Infection and Maximize Resistance  Recent Flowsheet Documentation  Taken 01/12/2022 2031 by Jairo Ben, RN  Infection Prevention: hand hygiene promoted     Problem: Oral Mucositis (Chemotherapy Effects)  Goal: Improved Oral Mucous Membrane Integrity  Outcome: Ongoing - Unchanged     Problem: Adult Inpatient Plan of Care  Goal: Plan of Care Review  Outcome: Ongoing - Unchanged  Goal: Patient-Specific Goal (Individualized)  Outcome: Ongoing - Unchanged  Goal: Absence of Hospital-Acquired Illness or Injury  Outcome: Ongoing - Unchanged  Intervention: Identify and Manage Fall Risk  Recent Flowsheet Documentation  Taken 01/13/2022 0051 by Jairo Ben, RN  Safety Interventions:   chemotherapeutic agent precautions   lighting adjusted for tasks/safety   low bed   nonskid shoes/slippers when out of bed  Taken 01/12/2022 2213 by Jairo Ben, RN  Safety Interventions:   chemotherapeutic agent precautions   lighting adjusted for tasks/safety   low bed   nonskid shoes/slippers when out of bed  Taken 01/12/2022 2031 by Jairo Ben, RN  Safety Interventions:   chemotherapeutic agent precautions   lighting adjusted for tasks/safety   low bed   nonskid shoes/slippers when out of bed  Intervention: Prevent and Manage VTE (Venous Thromboembolism) Risk  Recent Flowsheet Documentation  Taken 01/12/2022 2031 by Jairo Ben, RN  Activity Management: activity adjusted per tolerance  Intervention: Prevent Infection  Recent Flowsheet Documentation  Taken 01/12/2022 2031 by Jairo Ben, RN  Infection Prevention: hand hygiene promoted  Goal: Optimal Comfort and Wellbeing  Outcome: Ongoing - Unchanged

## 2022-01-14 LAB — CBC W/ AUTO DIFF
BASOPHILS ABSOLUTE COUNT: 0 10*9/L (ref 0.0–0.1)
BASOPHILS RELATIVE PERCENT: 0.1 %
EOSINOPHILS ABSOLUTE COUNT: 0 10*9/L (ref 0.0–0.5)
EOSINOPHILS RELATIVE PERCENT: 0 %
HEMATOCRIT: 22 % — ABNORMAL LOW (ref 34.0–44.0)
HEMOGLOBIN: 7.5 g/dL — ABNORMAL LOW (ref 11.3–14.9)
LYMPHOCYTES ABSOLUTE COUNT: 0.5 10*9/L — ABNORMAL LOW (ref 1.1–3.6)
LYMPHOCYTES RELATIVE PERCENT: 7.3 %
MEAN CORPUSCULAR HEMOGLOBIN CONC: 34 g/dL (ref 32.0–36.0)
MEAN CORPUSCULAR HEMOGLOBIN: 30.2 pg (ref 25.9–32.4)
MEAN CORPUSCULAR VOLUME: 88.7 fL (ref 77.6–95.7)
MEAN PLATELET VOLUME: 6.5 fL — ABNORMAL LOW (ref 6.8–10.7)
MONOCYTES ABSOLUTE COUNT: 0.3 10*9/L (ref 0.3–0.8)
MONOCYTES RELATIVE PERCENT: 3.9 %
NEUTROPHILS ABSOLUTE COUNT: 6.4 10*9/L (ref 1.8–7.8)
NEUTROPHILS RELATIVE PERCENT: 88.7 %
PLATELET COUNT: 205 10*9/L (ref 150–450)
RED BLOOD CELL COUNT: 2.48 10*12/L — ABNORMAL LOW (ref 3.95–5.13)
RED CELL DISTRIBUTION WIDTH: 20.3 % — ABNORMAL HIGH (ref 12.2–15.2)
WBC ADJUSTED: 7.2 10*9/L (ref 3.6–11.2)

## 2022-01-14 LAB — URINALYSIS WITH MICROSCOPY
BACTERIA: NONE SEEN /HPF
BILIRUBIN UA: NEGATIVE
GLUCOSE UA: NEGATIVE
KETONES UA: 60 — AB
LEUKOCYTE ESTERASE UA: NEGATIVE
NITRITE UA: NEGATIVE
PH UA: 7 (ref 5.0–9.0)
PROTEIN UA: NEGATIVE
RBC UA: 9 /HPF — ABNORMAL HIGH (ref <=4)
SPECIFIC GRAVITY UA: 1.013 (ref 1.003–1.030)
SQUAMOUS EPITHELIAL: 1 /HPF (ref 0–5)
UROBILINOGEN UA: <2
WBC UA: 1 /HPF (ref 0–5)

## 2022-01-14 LAB — BASIC METABOLIC PANEL
ANION GAP: 7 mmol/L (ref 5–14)
BLOOD UREA NITROGEN: 21 mg/dL (ref 9–23)
BUN / CREAT RATIO: 38
CALCIUM: 8.1 mg/dL — ABNORMAL LOW (ref 8.7–10.4)
CHLORIDE: 111 mmol/L — ABNORMAL HIGH (ref 98–107)
CO2: 26 mmol/L (ref 20.0–31.0)
CREATININE: 0.56 mg/dL — ABNORMAL LOW
EGFR CKD-EPI (2021) FEMALE: >90 mL/min/{1.73_m2} (ref >=60)
GLUCOSE RANDOM: 94 mg/dL (ref 70–179)
POTASSIUM: 3.4 mmol/L (ref 3.4–4.8)
SODIUM: 144 mmol/L (ref 135–145)

## 2022-01-14 LAB — HEPATIC FUNCTION PANEL
ALBUMIN: 3.1 g/dL — ABNORMAL LOW (ref 3.4–5.0)
ALKALINE PHOSPHATASE: 54 U/L (ref 46–116)
ALT (SGPT): 46 U/L (ref 10–49)
AST (SGOT): 20 U/L (ref <=34)
BILIRUBIN DIRECT: 0.3 mg/dL (ref 0.00–0.30)
BILIRUBIN TOTAL: 0.9 mg/dL (ref 0.3–1.2)
PROTEIN TOTAL: 5.6 g/dL — ABNORMAL LOW (ref 5.7–8.2)

## 2022-01-14 LAB — PHOSPHORUS: PHOSPHORUS: 2.9 mg/dL (ref 2.4–5.1)

## 2022-01-14 LAB — MAGNESIUM: MAGNESIUM: 2 mg/dL (ref 1.6–2.6)

## 2022-01-14 MED ADMIN — ondansetron (ZOFRAN) tablet 24 mg: 24 mg | ORAL | @ 06:00:00 | Stop: 2022-01-14

## 2022-01-14 MED ADMIN — cetirizine (ZyrTEC) tablet 10 mg: 10 mg | ORAL | @ 16:00:00

## 2022-01-14 MED ADMIN — sodium chloride (NS) 0.9 % flush 10 mL: 10 mL | INTRAVENOUS | @ 01:00:00

## 2022-01-14 MED ADMIN — OLANZapine (ZyPREXA) tablet 10 mg: 10 mg | ORAL | @ 06:00:00 | Stop: 2022-01-14

## 2022-01-14 MED ADMIN — sodium chloride (NS) 0.9 % flush 10 mL: 10 mL | INTRAVENOUS | @ 12:00:00

## 2022-01-14 MED ADMIN — dasatinib (SPRYCEL) tablet 70 mg: 70 mg | ORAL | @ 22:00:00

## 2022-01-14 MED ADMIN — DOXOrubicin (ADRIAMYCIN) 97.5 mg in sodium chloride (NS) 0.9 % 1,000 mL IVPB: 50 mg/m2 | INTRAVENOUS | @ 07:00:00

## 2022-01-14 MED ADMIN — mesna (MESNEX) 2,352 mg in sodium chloride (NS) 0.9 % 1,000 mL IVPB: 1200 mg/m2 | INTRAVENOUS | @ 01:00:00 | Stop: 2022-01-15

## 2022-01-14 MED ADMIN — valACYclovir (VALTREX) tablet 500 mg: 500 mg | ORAL | @ 12:00:00

## 2022-01-14 MED ADMIN — vinCRIStine (ONCOVIN) 2 mg in sodium chloride (NS) 0.9 % 25 mL IVPB: 2 mg | INTRAVENOUS | @ 07:00:00 | Stop: 2022-01-14

## 2022-01-14 MED ADMIN — dexAMETHasone (DECADRON) tablet 40 mg: 40 mg | ORAL | @ 06:00:00 | Stop: 2022-01-14

## 2022-01-14 MED ADMIN — dapsone tablet 100 mg: 100 mg | ORAL | @ 16:00:00

## 2022-01-14 MED ADMIN — escitalopram oxalate (LEXAPRO) tablet 10 mg: 10 mg | ORAL | @ 16:00:00

## 2022-01-14 MED ADMIN — levalbuterol (XOPENEX) nebulizer solution 0.31 mg: 1 | RESPIRATORY_TRACT | @ 01:00:00

## 2022-01-14 MED ADMIN — furosemide (LASIX) injection 20 mg: 20 mg | INTRAVENOUS | @ 16:00:00 | Stop: 2022-01-14

## 2022-01-14 MED ADMIN — levalbuterol (XOPENEX) nebulizer solution 0.31 mg: 1 | RESPIRATORY_TRACT | @ 12:00:00

## 2022-01-14 NOTE — Unmapped (Signed)
Hematology/Oncology APP Progress Note:     Admit Date: 01/10/2022   Today's Date: 01/14/2022      Attending Physician :  [name], MD  Primary Oncologist: Senaida Ores  Reason for Admission: C3 Hyper-CVAD    Patient Active Problem List   Diagnosis    Leukocytosis    B-cell acute lymphoblastic leukemia (ALL) (CMS-HCC)    Acute cough    Dyspepsia    Acute frontal sinusitis     Assessment/Plan: Patricia [name] is an 46 y.o. female who was admitted for C3 Hyper-CVAD (A).     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 Valtrex, Dapsone ppx. U/A with continued blood, asymptomatic, low c/f hem cystitis. FVO, giving additional Lasix today. Diurese further as indicated. Joint Pain, Oxycodone PRN. Continue Xopenex for chronic cough. Other medical management as delineated below.     Ph+ ALL, CR1: Initially diagnosed in 09/2021 and underwent [id number] induction complicated by PICC line associated superficial thrombus. BMBx on 5/1 revealed morphologic CR, indeterminate MRD status. Admitted for Cycle 3 HyperCVAD + Dasatinib. Patient has completed #5 IT chemos prior to admission.  IT #6 completed on 6/13 which was negative for disease. Plan to do one per cycle due to intolerance of two/cycle.  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)  - Dasatinib to 70 mg (last dose 6/12 at 0900)  - Valtrex, dapsone ppx     Regimen: R-HyperCVAD  Cycle # 3  Primary Oncologist: Senaida Ores     Odd Cycles  Date 6/12           Day 1 2 3 4 5                Doxorubicin 50 mg/m2       x     Vincristine 2 mg       x     Cyclophosphamide 300 mg/m2 Q12H X  x X  x X  x       Dexamethasone 40mg  x x x x     Methotrexate IT 12mg    x                          - Daily urinalysis, monitor for hemorraghic cystitis  - Monitor for PN and constipation      Disposition ODD:   - Neulasta/Ritux: scheduled for 6/19  - Follow-up lab tests scheduled for 2x weekly starting 6/19  - D11 Vincristine appointment: scheduled for 6/23  - BMBx- scheduled for 6/26  - Follow-up with Senaida Ores on 6/29  - D11-14 Dex, RX on dishcarge      FVO - Hypoxia: Secondary to fluid burden of chemotherapy. Noted to have 15lb weight gain on admission. Intermittently required supplemental O2 during hospitalization. CXR 6/15 with concern for pulmonary edema. Required Lasix 20mg  IV on 6/15 (x2), and 6/15 with subsequent improvement in weight and oxygenation.   - Lasix 20mg  IV     Grade 1 Peripheral Neuropathy: Paraesthesias present primarily in her finger pads, not affecting ADLs. Not on any medication directed therapy.  - CTM       Anxiety: Home regimen of Lexapro 10mg  daily. Continued on admission.  - Lexapro 10mg  daily     Acute on Chronic Pain, Back and Knee: Upper and Lower back. Improved. Has not had to take Oxycodone outside of strenuous exercise. Intermittent bilateral knee pain, relieved with APAP and Oxycodone 5mg   - Oxycodone 5mg  q4h PRN  - APAP PRN     Headaches, Chronic: No acute changes  on admission.   - PRN Esgisic     History of Acute on Chronic Sinusitis: Completed Augmentin 875-125mg  PO BID x 7 (5/1-5/8). Symptoms improving but ongoing for approx a month. Current home regimen of Xopenex BID and Zyrtec.   - Xopenex BID  - Zyrtec QD      Psychosocial distress: She reports a moderate level of anxiety regarding the management of the above.   - Counseling given   - Referral placed to [facility]     Cancer related fatigue:  Patient endorses fatigue with onset of cancer symptoms or treatment.      Immunocompromised status: Patient is immunocompromised secondary to disease or chemotherapy  -Antimicrobial prophylaxis as above     Impending Electrolyte Abnormality Secondary to Chemotherapy and/or IV Fluids  -Daily Electrolyte monitoring  -Replete per [facility] guidelines.      Impending Pancytopenia secondary to Acute Leukemia/chemotherapy:   - Transfuse 1 unit of pRBCs for hgb >8  - Transfuse 1 unit plt for plts >10K     FEN: reg diet  Standing electrolyte replacement panel for potassium and magnesium    Prophy: ambulation    Code Status: Full Code      Subjective/24hr events: [name]. Feels that breathing is significantly better. Denies HA, vision changes, CP, SOB, abdominal pain. No constipation. Discussed plan of care. No further questions offered.     Review of Systems:  12 point ROS otherwise negative except as above in the HPI.       Objective:  Temp:  [36.9 ??C (98.4 ??F)-37.6 ??C (99.7 ??F)] 37.1 ??C (98.8 ??F)  Heart Rate:  [87-110] 88  Resp:  [16-19] 16  BP: (114-125)/(73-90) 123/82  MAP (mmHg):  [84-98] 94  SpO2:  [91 %-95 %] 92 %    Intake/Output this shift:    Intake/Output Summary (Last 24 hours) at 01/14/2022 1000  Last data filed at 01/14/2022 0846  Gross per 24 hour   Intake 1270 ml   Output 3025 ml   Net -1755 ml         Weight:  Wt Readings from Last 1 Encounters:   01/12/22 88.5 kg (195 lb 3.2 oz)       Weight change:     Physical Exam:  General: Alert & Oriented, in no apparent distress, sitting on edge of bed.  HEENT:  PERRL. No scleral icterus or conjunctival injection. MMM without ulceration, erythema or exudate. No cervical or axillary lymphadenopathy.   Heart:  RRR. S1S2. No murmurs, gallops, or rubs. No LE Edema  Lungs: On room air. Normal effort, speaking full sentences with ease. CTAB.    Abdomen:  No distention or pain on palpation. No palpable hepatomegaly or splenomegaly.  No palpable masses.  Skin:  No rashes, petechiae or purpura. No areas of skin breakdown. Warm to touch, dry, smooth, and even.  Musculoskeletal:  No grossly-evident joint effusions or deformities.  Range of motion about the shoulder, elbow, hips and knees is grossly normal.  Psychiatric:  Range of affect is appropriate.    Neurologic:  A&O x 4. Responding appropriately. No focal deficits.  Extremities:  Appear well-perfused. No clubbing, edema, or cyanosis.  CVAD: PORT - no erythema, nontender; dressing CDI.        Lab Trends:   Recent Labs     01/12/22  0148 01/13/22  0052 01/14/22  0233   WBC 7.7 11.6* 7.2   NEUTROABS 6.6 10.0* 6.4   HGB 8.0* 8.0* 7.5*   HCT 23.6* 24.3* 22.0*  PLT 218 243 205   CREATININE 0.54* 0.52* - 0.52* 0.56*   BUN 14 16 - 16 21   BILITOT  --  0.4 0.9   AST  --  36* 20   ALT  --  51* 46   ALKPHOS  --  56 54   K 3.9 3.3* - 3.3* 3.4   MG 1.8 1.8 2.0   CALCIUM 8.4* 7.7* - 7.7* 8.1*   NA 143 147* - 147* 144   CL 114* 117* - 117* 111*   CO2 21.0 21.0 - 21.0 26.0           Imaging: no new imaging today    Medications:   Scheduled Meds:   cetirizine  10 mg Oral daily    dapsone  100 mg Oral Daily    dasatinib  70 mg Oral Daily    DOXOrubicin (ADRIAMYCIN) IVPB  50 mg/m2 (Treatment Plan Recorded) Intravenous Once    escitalopram oxalate  10 mg Oral Daily    levalbuterol  1 ampule Nebulization BID    LORazepam  1 mg Intravenous Once    mesna (MESNEX) IVPB  1,200 mg/m2 Intravenous Q24H    sodium chloride  10 mL Intravenous BID    sodium chloride  10 mL Intravenous BID    valACYclovir  500 mg Oral Daily     Continuous Infusions:   Chemo Clarification Order      Chemo Clarification Order      Chemo Clarification Order      IP okay to treat      sodium chloride      sodium chloride       PRN Meds:.acetaminophen, Chemo Clarification Order, Chemo Clarification Order, Chemo Clarification Order, diclofenac sodium, diphenhydrAMINE, emollient combination no.92, EPINEPHrine IM, famotidine (PEPCID) IV, IP okay to treat, loperamide, loperamide, meperidine, methylPREDNISolone sodium succinate (PF), oxyCODONE, prochlorperazine, prochlorperazine, sodium chloride, sodium chloride 0.9%      Rhodia Albright PA-C  Physician Assistant  Hematology/Oncology  [facility]    Group Pager: [phone]  01/14/22

## 2022-01-14 NOTE — Unmapped (Signed)
VSS althought temp increasing slightly overnight. Mesna started infusing again after speaking to chemo pharmacy. Pt also receiving Vincristine and Doxorubicin as ordered. Remains free of falls.     Problem: Neurotoxicity (Chemotherapy Effects)  Goal: Neurotoxicity Symptom Control  Outcome: Ongoing - Unchanged     Problem: Neutropenia (Chemotherapy Effects)  Goal: Absence of Infection  Outcome: Ongoing - Unchanged     Problem: Urinary Bleeding Risk or Actual (Chemotherapy Effects)  Goal: Absence of Hematuria  Outcome: Ongoing - Unchanged     Problem: Urinary Bleeding Risk or Actual (Chemotherapy Effects)  Goal: Absence of Hematuria  Outcome: Ongoing - Unchanged

## 2022-01-14 NOTE — Unmapped (Signed)
Pt alert and orient x4. Independent with ADL's. Reports hemorrhoids, tucks given. Pt with 13lb weight gain in 2 days, new order for IV lasix x1. diuresised 1250 ml. Mesna d/c'd. Pt with SOB with exertion, improved with neb tx and iv lasix. Denies pain.V/SS. Cycle 3 day 4 chemo tol well. Appetite good. Husband at bedside and supportive.     Problem: Anemia (Chemotherapy Effects)  Goal: Anemia Symptom Improvement  01/13/2022 1851 by Linna Darner, RN  Outcome: Progressing  01/13/2022 1851 by Linna Darner, RN  Outcome: Not Progressing  Intervention: Monitor and Manage Anemia  Recent Flowsheet Documentation  Taken 01/13/2022 1800 by Linna Darner, RN  Safety Interventions: chemotherapeutic agent precautions     Problem: Urinary Bleeding Risk or Actual (Chemotherapy Effects)  Goal: Absence of Hematuria  01/13/2022 1851 by Linna Darner, RN  Outcome: Progressing  01/13/2022 1851 by Linna Darner, RN  Outcome: Not Progressing     Problem: Nausea and Vomiting (Chemotherapy Effects)  Goal: Fluid and Electrolyte Balance  01/13/2022 1851 by Linna Darner, RN  Outcome: Progressing  01/13/2022 1851 by Linna Darner, RN  Outcome: Not Progressing     Problem: Neurotoxicity (Chemotherapy Effects)  Goal: Neurotoxicity Symptom Control  01/13/2022 1851 by Linna Darner, RN  Outcome: Progressing  01/13/2022 1851 by Linna Darner, RN  Outcome: Not Progressing     Problem: Neutropenia (Chemotherapy Effects)  Goal: Absence of Infection  01/13/2022 1851 by Linna Darner, RN  Outcome: Progressing  01/13/2022 1851 by Linna Darner, RN  Outcome: Not Progressing  Intervention: Prevent Infection and Maximize Resistance  Recent Flowsheet Documentation  Taken 01/13/2022 1800 by Linna Darner, RN  Infection Prevention: hand hygiene promoted     Problem: Oral Mucositis (Chemotherapy Effects)  Goal: Improved Oral Mucous Membrane Integrity  01/13/2022 1851 by Linna Darner, RN  Outcome: Progressing  01/13/2022 1851 by Linna Darner, RN  Outcome: Not Progressing  Intervention: Promote Oral Comfort and Health  Recent Flowsheet Documentation  Taken 01/13/2022 1800 by Linna Darner, RN  Oral Mucous Membrane Protection: nonirritating oral fluids promoted     Problem: Thrombocytopenia Bleeding Risk (Chemotherapy Effects)  Goal: Absence of Bleeding  01/13/2022 1851 by Linna Darner, RN  Outcome: Progressing  01/13/2022 1851 by Linna Darner, RN  Outcome: Not Progressing     Problem: Adult Inpatient Plan of Care  Goal: Plan of Care Review  01/13/2022 1851 by Linna Darner, RN  Outcome: Progressing  01/13/2022 1851 by Linna Darner, RN  Outcome: Not Progressing  Goal: Patient-Specific Goal (Individualized)  01/13/2022 1851 by Linna Darner, RN  Outcome: Progressing  01/13/2022 1851 by Linna Darner, RN  Outcome: Not Progressing  Goal: Absence of Hospital-Acquired Illness or Injury  01/13/2022 1851 by Linna Darner, RN  Outcome: Progressing  01/13/2022 1851 by Linna Darner, RN  Outcome: Not Progressing  Intervention: Identify and Manage Fall Risk  Recent Flowsheet Documentation  Taken 01/13/2022 1800 by Linna Darner, RN  Safety Interventions: chemotherapeutic agent precautions  Intervention: Prevent and Manage VTE (Venous Thromboembolism) Risk  Recent Flowsheet Documentation  Taken 01/13/2022 1800 by Linna Darner, RN  Activity Management: activity adjusted per tolerance  Intervention: Prevent Infection  Recent Flowsheet Documentation  Taken 01/13/2022 1800 by Linna Darner, RN  Infection Prevention: hand hygiene promoted  Goal: Optimal Comfort and Wellbeing  01/13/2022 1851 by Linna Darner, RN  Outcome: Progressing  01/13/2022 1851 by  Linna Darner, RN  Outcome: Not Progressing  Goal: Readiness for Transition of Care  01/13/2022 1851 by Linna Darner, RN  Outcome: Progressing  01/13/2022 1851 by Linna Darner, RN  Outcome: Not Progressing  Goal: Rounds/Family Conference  01/13/2022 1851 by Linna Darner, RN  Outcome: Progressing  01/13/2022 1851 by Linna Darner, RN  Outcome: Not Progressing

## 2022-01-15 LAB — BASIC METABOLIC PANEL
ANION GAP: 5 mmol/L (ref 5–14)
BLOOD UREA NITROGEN: 20 mg/dL (ref 9–23)
BUN / CREAT RATIO: 40
CALCIUM: 8.1 mg/dL — ABNORMAL LOW (ref 8.7–10.4)
CHLORIDE: 112 mmol/L — ABNORMAL HIGH (ref 98–107)
CO2: 26 mmol/L (ref 20.0–31.0)
CREATININE: 0.5 mg/dL — ABNORMAL LOW
EGFR CKD-EPI (2021) FEMALE: >90 mL/min/{1.73_m2} (ref >=60)
GLUCOSE RANDOM: 85 mg/dL (ref 70–179)
POTASSIUM: 3.1 mmol/L — ABNORMAL LOW (ref 3.4–4.8)
SODIUM: 143 mmol/L (ref 135–145)

## 2022-01-15 LAB — URINALYSIS WITH MICROSCOPY
BACTERIA: NONE SEEN /HPF
BILIRUBIN UA: NEGATIVE
BLOOD UA: NEGATIVE
GLUCOSE UA: NEGATIVE
KETONES UA: 80 — AB
LEUKOCYTE ESTERASE UA: NEGATIVE
NITRITE UA: NEGATIVE
PH UA: 7.5 (ref 5.0–9.0)
PROTEIN UA: NEGATIVE
RBC UA: 5 /HPF — ABNORMAL HIGH (ref <=4)
SPECIFIC GRAVITY UA: 1.015 (ref 1.003–1.030)
SQUAMOUS EPITHELIAL: 1 /HPF (ref 0–5)
UROBILINOGEN UA: <2
WBC UA: <1 /HPF (ref 0–5)

## 2022-01-15 LAB — CBC W/ AUTO DIFF
BASOPHILS ABSOLUTE COUNT: 0 10*9/L (ref 0.0–0.1)
BASOPHILS RELATIVE PERCENT: 0.3 %
EOSINOPHILS ABSOLUTE COUNT: 0 10*9/L (ref 0.0–0.5)
EOSINOPHILS RELATIVE PERCENT: 0 %
HEMATOCRIT: 21.4 % — ABNORMAL LOW (ref 34.0–44.0)
HEMOGLOBIN: 7.4 g/dL — ABNORMAL LOW (ref 11.3–14.9)
LYMPHOCYTES ABSOLUTE COUNT: 0.3 10*9/L — ABNORMAL LOW (ref 1.1–3.6)
LYMPHOCYTES RELATIVE PERCENT: 4.3 %
MEAN CORPUSCULAR HEMOGLOBIN CONC: 34.6 g/dL (ref 32.0–36.0)
MEAN CORPUSCULAR HEMOGLOBIN: 30.7 pg (ref 25.9–32.4)
MEAN CORPUSCULAR VOLUME: 88.5 fL (ref 77.6–95.7)
MEAN PLATELET VOLUME: 6.5 fL — ABNORMAL LOW (ref 6.8–10.7)
MONOCYTES ABSOLUTE COUNT: 0.1 10*9/L — ABNORMAL LOW (ref 0.3–0.8)
MONOCYTES RELATIVE PERCENT: 1.5 %
NEUTROPHILS ABSOLUTE COUNT: 7.5 10*9/L (ref 1.8–7.8)
NEUTROPHILS RELATIVE PERCENT: 93.9 %
PLATELET COUNT: 194 10*9/L (ref 150–450)
RED BLOOD CELL COUNT: 2.42 10*12/L — ABNORMAL LOW (ref 3.95–5.13)
RED CELL DISTRIBUTION WIDTH: 20.1 % — ABNORMAL HIGH (ref 12.2–15.2)
WBC ADJUSTED: 8 10*9/L (ref 3.6–11.2)

## 2022-01-15 LAB — PHOSPHORUS: PHOSPHORUS: 2.5 mg/dL (ref 2.4–5.1)

## 2022-01-15 LAB — MAGNESIUM: MAGNESIUM: 2 mg/dL (ref 1.6–2.6)

## 2022-01-15 MED ADMIN — mesna (MESNEX) 2,352 mg in sodium chloride (NS) 0.9 % 1,000 mL IVPB: 1200 mg/m2 | INTRAVENOUS | Stop: 2022-01-15

## 2022-01-15 MED ADMIN — sodium chloride (NS) 0.9 % flush 10 mL: 10 mL | INTRAVENOUS

## 2022-01-15 MED ADMIN — potassium chloride ER tablet 40 mEq: 40 meq | ORAL | @ 14:00:00 | Stop: 2022-01-15

## 2022-01-15 MED ADMIN — sodium chloride (NS) 0.9 % flush 10 mL: 10 mL | INTRAVENOUS | @ 14:00:00 | Stop: 2022-01-15

## 2022-01-15 MED ADMIN — valACYclovir (VALTREX) tablet 500 mg: 500 mg | ORAL | @ 14:00:00 | Stop: 2022-01-15

## 2022-01-15 NOTE — Unmapped (Signed)
Pt discharged- [facility] port de-accessed per orders. Medications delivered to bedside.    Problem: Anemia (Chemotherapy Effects)  Goal: Anemia Symptom Improvement  Outcome: Resolved     Problem: Urinary Bleeding Risk or Actual (Chemotherapy Effects)  Goal: Absence of Hematuria  Outcome: Resolved     Problem: Nausea and Vomiting (Chemotherapy Effects)  Goal: Fluid and Electrolyte Balance  Outcome: Resolved     Problem: Neurotoxicity (Chemotherapy Effects)  Goal: Neurotoxicity Symptom Control  Outcome: Resolved     Problem: Neutropenia (Chemotherapy Effects)  Goal: Absence of Infection  Outcome: Resolved     Problem: Oral Mucositis (Chemotherapy Effects)  Goal: Improved Oral Mucous Membrane Integrity  Outcome: Resolved     Problem: Thrombocytopenia Bleeding Risk (Chemotherapy Effects)  Goal: Absence of Bleeding  Outcome: Resolved     Problem: Adult Inpatient Plan of Care  Goal: Plan of Care Review  Outcome: Resolved  Goal: Patient-Specific Goal (Individualized)  Outcome: Resolved  Goal: Absence of Hospital-Acquired Illness or Injury  Outcome: Resolved  Goal: Optimal Comfort and Wellbeing  Outcome: Resolved  Goal: Readiness for Transition of Care  Outcome: Resolved  Goal: Rounds/Family Conference  Outcome: Resolved

## 2022-01-15 NOTE — Unmapped (Signed)
Physician Discharge Summary [facility]  4 ONC [facility]  [address]  [place] [address]  Dept: [phone]  Loc: [phone]     Identifying Information:   Patricia [name]  30-Apr-1976  [id number]    Primary Care Physician: [name], MD     Referring Physician: [name]     Code Status: Full Code    Admit Date: 01/10/2022    Discharge Date: 01/15/2022     Discharge To: Home    Discharge Service: [facility] - Hematology APP Floor Team (MEDQ)     Discharge Attending Physician: No att. providers found    Discharge Diagnoses:  Active Problems:    * No active hospital problems. *  Resolved Problems:    * No resolved hospital problems. *      Outpatient Provider Follow Up Issues:   Supportive Care Recommendations:  We recommend based on the patient???s underlying diagnosis and treatment history the following supportive care:    1. Antimicrobial prophylaxis:  ALL (in remission, receiving post-remission therapy): Bacterial: Levofloxacin 500mg  PO daily (when absolute neutrophils </= 0.5);   Fungal: Fluconazole 200mg  PO daily (when absolute neutrophils </= 0.5);   Viral: Valacyclovir 500mg  PO daily (Continuous);   PJP:  For SMX/TMP intolerant patients:  Dapsone 100mg  PO daily; monthly nebulized pentamidine    2. Blood product support:  Leukoreduced blood products are required.  Irradiated blood products are preferred, but in case of urgent transfusion needs non-irradiated blood products may be used:     -  RBC transfusion threshold: transfuse 2 units for Hgb < 8 g/dL.    Based on the patient's disease status and intensity of therapy, complete blood count with differential should be evaluated 2 times per week and used to guide transfusion support    3. Hematopoietic growth factor support: pegylated filgrastim 24-72 hours after last chemotherapy dose    4. Patient needs OP LP with IT Chemotherapy: no, Intrathecal chemotherapy signed: NA    Hospital Course:   Patricia [name] is an 46 y.o. female who was admitted for C3 Hyper-CVAD (A). She tolerated chemotherapy well.  She continued to have blood in daily UA, however, without any symptoms. Chemo was continued. Hematuria resolved by discharge. Patient did have FVO in setting of 15lb weight gain which required diuresis. She will discharge on levaquin, fluconazole, dapsone, valtrex ppx. She will be in touch with [facility] services for delivery of additional dasatinib. She will return on 6/19 for OP rituximab and neulasta. She will have labs 2x weekly at [facility] starting 6/19. She will return on 6/23 for D11 VCR and take dexamethasone for 4 days starting 6/23. She will have a BMBx on 6/26 and see Dr Senaida Ores in follow-up on 6/29.       Ph+ALL, in remission: Initially diagnosed in 09/2021 and underwent [id number] induction complicated by PICC line associated superficial thrombus. BMBx on 5/2 revealed morphologic CR. Admitted for Cycle 3 HyperCVAD + Dasatinib. Patient has completed #5 IT chemos prior to admission, with plan to do one per cycle due to intolerance of two/cycle. IT #6 completed on 6/13- negative for disease.  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 Not affecting ADLs.     Anxiety: Reports taking Lexapro 10 mg at home.     Acute on Chronic back and knee pain, upper and lower back. Improved. Has not had to take oxycodone outside of needing to take after moving boxes on Saturday. Patient with intermittent b/l knee pain, relieved with APAP and oxycodone 5mg . Continue PRN.     Headaches:  - PRN Esgisic Resent History acute on chronic sinusitis: Completed augmentin 875-125mg  PO BID x 7(5/1-5/8). Symptoms improving but ongoing for approx a month. She has been doing xopenex BID at home for cough. Takes zyrtec daily. Continue home regimen.      Psychosocial distress: She reports a moderate level of anxiety regarding the management of the above.   - Counseling given   - Consider ref to comprehensive cancer support program     Procedures:  lumbar puncture and Chemotherapy  No admission procedures for hospital encounter.  ______________________________________________________________________  Discharge Medications:     Your Medication List        START taking these medications      dexAMETHasone 4 MG tablet  Commonly known as: DECADRON  Take 10 tablets (40 mg total) by mouth daily for 4 days.  Start taking on: January 20, 2022     fluconazole 200 MG tablet  Commonly known as: DIFLUCAN  Take 1 tablet (200 mg total) by mouth daily.            CHANGE how you take these medications      levoFLOXacin 500 MG tablet  Commonly known as: LEVAQUIN  Take 1 tablet (500 mg total) by mouth daily.  What changed:   medication strength  how much to take            CONTINUE taking these medications      acetaminophen 650 MG CR tablet  Commonly known as: TYLENOL 8 HOUR  Take 1 tablet (650 mg total) by mouth every eight (8) hours as needed for pain.     cetirizine 10 MG tablet  Commonly known as: ZyrTEC  Take 1 tablet (10 mg total) by mouth nightly.     dapsone 100 MG tablet  Take 1 tablet (100 mg total) by mouth daily.     dasatinib 70 MG tablet  Commonly known as: SPRYCEL  Take 1 tablet (70 mg total) by mouth daily.     escitalopram oxalate 10 MG tablet  Commonly known as: LEXAPRO  Take 1 tablet (10 mg total) by mouth daily.     guaiFENesin 600 mg 12 hr tablet  Commonly known as: MUCINEX  Take 1 tablet (600 mg total) by mouth two (2) times a day as needed for congestion.     levalbuterol 0.31 mg/3 mL nebulizer solution  Commonly known as: XOPENEX  Inhale 3 mL (0.31 mg total) by nebulization every four (4) hours as needed for wheezing (cough).  nifedipine 0.3% lidocaine 1.5% in petrolatum ointment  Apply 1 application. topically daily.     prochlorperazine 10 MG tablet  Commonly known as: COMPAZINE  Take 1 tablet (10 mg total) by mouth every six (6) hours as needed for up to 14 days.     valACYclovir 500 MG tablet  Commonly known as: VALTREX  Take 1 tablet (500 mg total) by mouth daily.              Allergies:  Erythromycin, Sulfa (sulfonamide antibiotics), and Azithromycin  ______________________________________________________________________  Pending Test Results (if blank, then none):      Most Recent Labs:  All lab results last 24 hours -   Recent Results (from the past 24 hour(s))   Basic Metabolic Panel    Collection Time: 01/15/22  3:08 AM   Result Value Ref Range    Sodium 143 135 - 145 mmol/L    Potassium 3.1 (L) 3.4 - 4.8 mmol/L    Chloride 112 (H) 98 - 107 mmol/L    CO2 26.0 20.0 - 31.0 mmol/L    Anion Gap 5 5 - 14 mmol/L    BUN 20 9 - 23 mg/dL    Creatinine 1.61 (L) 0.60 - 0.80 mg/dL    BUN/Creatinine Ratio 40     eGFR CKD-EPI (2021) Female >90 >=60 mL/min/1.51m2    Glucose 85 70 - 179 mg/dL    Calcium 8.1 (L) 8.7 - 10.4 mg/dL   Magnesium Level    Collection Time: 01/15/22  3:08 AM   Result Value Ref Range    Magnesium 2.0 1.6 - 2.6 mg/dL   Phosphorus Level    Collection Time: 01/15/22  3:08 AM   Result Value Ref Range    Phosphorus 2.5 2.4 - 5.1 mg/dL   CBC w/ Differential    Collection Time: 01/15/22  3:08 AM   Result Value Ref Range    WBC 8.0 3.6 - 11.2 10*9/L    RBC 2.42 (L) 3.95 - 5.13 10*12/L    HGB 7.4 (L) 11.3 - 14.9 g/dL    HCT 09.6 (L) 04.5 - 44.0 %    MCV 88.5 77.6 - 95.7 fL    MCH 30.7 25.9 - 32.4 pg    MCHC 34.6 32.0 - 36.0 g/dL    RDW 40.9 (H) 81.1 - 15.2 %    MPV 6.5 (L) 6.8 - 10.7 fL    Platelet 194 150 - 450 10*9/L    Neutrophils % 93.9 %    Lymphocytes % 4.3 %    Monocytes % 1.5 %    Eosinophils % 0.0 %    Basophils % 0.3 %    Absolute Neutrophils 7.5 1.8 - 7.8 10*9/L    Absolute Lymphocytes 0.3 (L) 1.1 - 3.6 10*9/L    Absolute Monocytes 0.1 (L) 0.3 - 0.8 10*9/L    Absolute Eosinophils 0.0 0.0 - 0.5 10*9/L    Absolute Basophils 0.0 0.0 - 0.1 10*9/L    Anisocytosis Moderate (A) Not Present   Urinalysis with Microscopy    Collection Time: 01/15/22  6:15 AM   Result Value Ref Range    Color, UA Colorless     Clarity, UA Clear     Specific Gravity, UA 1.015 1.003 - 1.030    pH, UA 7.5 5.0 - 9.0    Leukocyte Esterase, UA Negative Negative    Nitrite, UA Negative Negative    Protein, UA Negative Negative    Glucose, UA Negative Negative    Ketones,  UA 80 mg/dL (A) Negative    Urobilinogen, UA <2.0 mg/dL <8.4 mg/dL    Bilirubin, UA Negative Negative    Blood, UA Negative Negative    RBC, UA 5 (H) <=4 /HPF    WBC, UA <1 0 - 5 /HPF    Squam Epithel, UA 1 0 - 5 /HPF    Bacteria, UA None Seen None Seen /HPF    Mucus, UA Rare (A) None Seen /HPF       Relevant Studies/Radiology (if blank, then none):  XR Chest 1 view    Result Date: 01/13/2022  EXAM: XR CHEST 1 VIEW DATE: 01/13/2022 1:31 AM ACCESSION: [id number] UN DICTATED: 01/13/2022 1:54 AM INTERPRETATION LOCATION: [facility] CLINICAL INDICATION: 46 years old Female with SHORTNESS OF BREATH  TECHNIQUE: Single View AP Chest Radiograph. COMPARISON: 12/14/2021 chest radiograph FINDINGS: Accessed dual lumen right chest port with catheter tip overlying the cavoatrial junction. Hazy bilateral perihilar and basilar predominant interstitial opacities, increased compared to prior. No pleural effusion or pneumothorax. Stable cardiomediastinal silhouette.     Diffuse perihilar and basilar predominant interstitial opacities, favored to represent pulmonary edema. Superimposed infection cannot be excluded on imaging.    XR Abdomen 1 View    Result Date: 01/13/2022  EXAM: XR ABDOMEN 1 VIEW DATE: 01/12/2022 11:37 PM ACCESSION: [id number] UN DICTATED: 01/13/2022 8:48 AM INTERPRETATION LOCATION: [facility] CLINICAL INDICATION: 46 years old Female with Receiving cytoxan, c/f FVO and poss renal calculus ; OTHER  COMPARISON: CT abdomen pelvis dated 11/08/2021. TECHNIQUE: Supine view of the abdomen. FINDINGS: Nonobstructive bowel gas pattern. No definite radiopaque density to suggest urolithiasis. Phleboliths in the left lower pelvis. See recent chest x-ray for findings above the diaphragm. No acute osseous abnormalities.     No definite radiopaque density to suggest urolithiasis.    FL Lumbar Puncture With Chemo    Result Date: 01/11/2022  EXAM: Intrathecal chemotherapy, injection under fluoroscopic guidance.  Imaging supervision and interpretation. DATE: 01/11/2022 2:46 PM ACCESSION: [id number] UN DICTATED: 01/11/2022 3:28 PM INTERPRETATION LOCATION: [facility] CLINICAL INDICATION: 46 years old Female with LP with IT chemotherapy  COMPARISON: FL lumbar puncture 12/17/2021. FLUOROSCOPY TIME: 0.9 minutes. CONSENT: The potential risks and benefits of the procedure were discussed and all questions were answered and written and verbal consent was obtained and documented by Dr. Windell Norfolk .  TIME OUT: A time out was performed to verify patient, procedure, and site. During the patient time out, name and DOB patient identifiers are checked verbally (if possible) and also with the patients wrist band prior to the procedure. 1 mg of Ativan was given during the procedure for anxiolysis in addition to the 1 mg given on the floor. PROCEDURE: The patient was placed in a prone oblique position on the fluoroscopy table, and the lower back was prepped and draped in the usual sterile fashion.  Local anesthesia was provided at the selected puncture site using 1% lidocaine.  Using sterile technique and intermittent fluoroscopic guidance, a 20-gauge, 9 cm spinal needle was introduced into the thecal sac at L3-4 using a sublaminar approach.  There was spontaneous return of clear CSF.  Approximately 6 mL of CSF was collected. The specimen was sent to the lab. Approximately 6 mL of cytarabine/methotrexate was instilled over 3-5 minutes by Dr. Windell Norfolk.  The stylet was replaced, and the needle was removed. A sterile bandage was placed at the puncture site. The patient was instructed to lie supine for at least one hour. The patient tolerated the procedure without immediate complications.  ATTESTATION: Dr Alisia Ferrari was present for the critical portions, and immediately available for the remainder of this procedure.     Lumbar puncture using fluoroscopic guidance with intrathecal chemotherapy.   ______________________________________________________________________            Other Instructions       Discharge instructions      New/Important Medications:  - Valtrex - helps to prevent shingles  - Dapsone  - Levaquin  - Fluconazole  - Dexamethasone: please take for 4 days beginning 6/22-6/26    Follow up Appointments:   - Pegfilgrastim (Neulasta, Udenyca, or equivalent) injection on 6/19  - Rituximab Infusion on 6/19  - Labs with transfusion support 2 times weekly at [facility] starting on 6/19  - Day 11 vincristine infusion on 6/22  - Dr. Senaida Ores at [facility] on 6/29       Diagnosis: ALL    Course complications:  -  None. You had some blood in your urine which we gave you extra bladder protectant medication.      When to Call Your [facility] Cancer Care Team:   Monday- Friday from 8:00 am - 5:00 pm   On Lovenia Kim and Holidays   Call [phone]     Call [phone]  Or Toll free [phone]    Ask the operator to page the   Ask to speak to the Triage Nurse  Oncology Fellow on Call     RED ZONE:  Take action now!  You need to be seen right away. Call 911 or go to your nearest hospital for help.   - Symptoms are at a severe level of discomfort    - Bleeding that will not stop  - Chest Pain    - Hard to breathe    - Fall or passing out    - New Seizure    - Thoughts of hurting yourself or others     YELLOW ZONE: Take action today  This is NOT an all-inclusive list. Pleae call with any new or worsening symptoms.   Call your doctor, nurse or other healthcare provider at [phone]  You can be seen by a provider the same day through our Same Day Acute Care for Patients with Cancer program.   - Symptoms are new or worsening; You are not within your goal range for:    - Pain          - Swelling (leg, arm, abdomen, face, neck)    - Shortness of breath       - Skin rash or skin changes    - Bleeding (nose, urine, stool, wound)    - Wound issues (redness, drainage, re-opened)    - Feeling sick to your stomach and throwing up    - Confusion    - Mouth sores/pain in your mouth or throat    - Vision changes   - Hard stool or very loose stools (increase in ostomy   - Fever >100.4 F, chills   Output)        - Worsening cough with mucus that is green, yellow or bloody   - No urine for 12 hours      - Pain or burning when going to the bathroom    - Feeding tube or other catheter/tube issue    - Home infusion pump issue - call [phone]   - Redness or pain at previous IV or port/catheter site    - Depressed or anxiety     GREEN ZONE: You are  in control   Your symptoms are under control. Continue to take your medicine as ordered. Keep all visits to the doctor.   - No increase or worsening symptoms   - Able to take your medicine   - Able to drink and eat     For your safety and best care, please DO NOT use MyChart messages to report red or yellow symptoms.   MyChart messages are only checked during weekday normal business hours and you should receive a   Response within 2 business days.   Please use MyChart only for the follows:   - Non-urgent medication refills, scheduling requests or general questions.           Patient Education:     Your blood counts will continue to drop, so it is good to adhere to the following precautions:    - Wash your hands routinely with soap and water  - Take your temperature when you have chills or are not feeling well  - Use a soft toothbrush  - Avoid constipation or straining with bowel movements. This may mean you occasionally need to take over-the-counter stool softeners or laxatives.   - Avoid people who have colds or the flu, or are not feeling well.  - Wear a mask when visiting crowded places.  - Avoid anyone who has received a live vaccination (shot) within the last three weeks  - Maintain a well-balanced diet and eat healthy foods  - Speak with your doctor before having any dental work done  - Limit exposure to pet feces (e.g., litter box)  - Do only as much activity as you can tolerate    Other instructions:  - Don't use dental floss if your platelet count is below 50,000. Your doctor or nurse should tell you if this is the case.  - Use any mouthwashes given to you as directed.  - If you can't tolerate regular brushing, use an oral swab (bristle-less) toothbrush, or use salt and baking soda to clean your mouth. Mix 1 teaspoon of salt and 1 teaspoon of baking soda into an 8-ounce glass of warm water. Swish and spit.  - Watch your mouth and tongue for white patches. This is a sign of fungal infection, a common side effect of chemotherapy. Be sure to tell your doctor about these patches. Medication/mouthwashes can be prescribed to help you fight the fungal infection.      COVID-19 is a new challenge, but [place] and the [facility] is dedicated to providing you and your loved ones with the best possible cancer care and support in the safest way possible during this time. We made two videos about the ways we are working to keep you safe, such as offering the option to visit your care team over the phone or through a video, as well as support services offered for our patients and their caregivers. If you have any questions about your cancer care, please call your care team.     Video #1: Keeping [facility] Cancer Care patients safe during the COVID-19 crisis  http://go.eabjmlille.com     Video #2: Support for cancer patients and their caregivers during the COVID-19 pandemic  http://go.SecureGap.uy    Physician communication order      Ensure patient has MUGA (or Echo) prior to administration of DOXOrubicin.            Follow Up instructions and Outpatient Referrals     Discharge instructions  Appointments which have been scheduled for you      Jan 17, 2022  8:00 AM  (Arrive by 7:30 AM)  NURSE LAB DRAW with ADULT ONC LAB  [facility] ADULT ONCOLOGY LAB DRAW STATION [place] [facility] REGION) [address]  [place] [address]  [phone]        Jan 17, 2022  9:00 AM  (Arrive by 8:30 AM)  BLOOD TRANSFUSION - 2 UNITS with [employer] CHAIR 20  [place] ONCOLOGY INFUSION [place] [facility] REGION) [address]  [place] [address]  [phone]        Jan 21, 2022  1:00 PM  (Arrive by 12:30 PM)  NURSE LAB DRAW with ADULT ONC LAB  [facility] ADULT ONCOLOGY LAB DRAW STATION [facility] [facility] REGION) [address]  [place] [address]  [phone]        Jan 21, 2022  2:00 PM  (Arrive by 1:30 PM)  BLOOD TRANSFUSION - 2 UNITS with [employer] CHAIR 34  [place] ONCOLOGY INFUSION [place] [facility] REGION) [address]  [place] [address]  [phone]        Jan 24, 2022  8:30 AM  (Arrive by 8:00 AM)  NURSE LAB DRAW with ADULT ONC LAB  [facility] ADULT ONCOLOGY LAB DRAW STATION [facility] REGION) [address]  [place] [address]  [phone]        Jan 24, 2022  9:30 AM  (Arrive by 9:00 AM)  BONE MARROW BIOPSY with Darel Hong, PA  [place] ONCOLOGY INFUSION [place] [facility] REGION) [address]  [place] [address]  [phone]        Jan 24, 2022  2:00 PM  (Arrive by 1:30 PM)  BLOOD TRANSFUSION - 2 UNITS with [employer] CHAIR 31  [place] ONCOLOGY INFUSION [place] [facility] REGION) [address]  [place] [address]  [phone] Jan 27, 2022  1:30 PM  (Arrive by 1:00 PM)  LAB ONLY [place] with ADULT ONC LAB  [facility] ADULT ONCOLOGY LAB DRAW STATION [place] [facility] REGION) [address]  [place] [address]  [phone]        Jan 27, 2022  2:30 PM  (Arrive by 2:00 PM)  RETURN ACTIVE Ama with [name], MD  [facility] HEMATOLOGY ONCOLOGY 2ND FLR CANCER HOSP [facility] REGION) [address]  [place] [address]  [phone]        Jan 27, 2022  3:30 PM  (Arrive by 3:00 PM)  BLOOD TRANSFUSION - 2 UNITS with [employer] CHAIR 23  [place] ONCOLOGY INFUSION [place] [facility] REGION) [address]  [place] [address]  [phone]        Jan 31, 2022 11:45 AM  (Arrive by 11:15 AM)  BLOOD TRANSFUSION - 2 UNITS with [employer] CHAIR 24  [place] ONCOLOGY INFUSION [place] [facility] REGION) [address]  [place] [address]  [phone]        Feb 04, 2022  1:15 PM  (Arrive by 12:45 PM)  BLOOD TRANSFUSION - 2 UNITS with ONCINF CHAIR 19   ONCOLOGY INFUSION [place] [facility] REGION) [address]  [place] [address]  [phone]             ______________________________________________________________________  Discharge Day Services:  BP 132/87  - Pulse  105  - Temp 37.2 ??C (99 ??F) (Oral)  - Resp 18  - Ht 172.7 cm (5' 8)  - Wt 85.6 kg (188 lb 11.2 oz)  - SpO2 93%  - BMI 28.69 kg/m??   Pt seen on the day of discharge and determined appropriate for discharge.    Condition at Discharge: good    Length of Discharge: I spent greater than 30 mins in the discharge of this patient.     Thornell Mule. Janeth Rase  Hematology/Oncology Department  01/15/22 12:33PM

## 2022-01-15 NOTE — Unmapped (Signed)
Problem: Anemia (Chemotherapy Effects)  Goal: Anemia Symptom Improvement  Outcome: Ongoing - Unchanged  Intervention: Monitor and Manage Anemia  Recent Flowsheet Documentation  Taken 01/15/2022 0254 by Jerrel Ivory, RN  Safety Interventions:   low bed   fall reduction program maintained   chemotherapeutic agent precautions  Taken 01/15/2022 0025 by Jerrel Ivory, RN  Safety Interventions:   low bed   fall reduction program maintained   chemotherapeutic agent precautions  Taken 01/14/2022 2230 by Jerrel Ivory, RN  Safety Interventions:   chemotherapeutic agent precautions   fall reduction program maintained   low bed  Taken 01/14/2022 2025 by Jerrel Ivory, RN  Safety Interventions:   chemotherapeutic agent precautions   fall reduction program maintained   low bed   nonskid shoes/slippers when out of bed

## 2022-01-15 NOTE — Unmapped (Incomplete)
No blood in urine reported since 2 days ago but patient on doxorubicin last 15 hours. Continuing mesna until discharge.

## 2022-01-17 ENCOUNTER — Encounter
Admit: 2022-01-17 | Discharge: 2022-01-18 | Payer: PRIVATE HEALTH INSURANCE | Attending: Hematology | Primary: Hematology

## 2022-01-17 ENCOUNTER — Ambulatory Visit: Admit: 2022-01-17 | Discharge: 2022-01-18 | Payer: PRIVATE HEALTH INSURANCE

## 2022-01-17 ENCOUNTER — Other Ambulatory Visit: Admit: 2022-01-17 | Discharge: 2022-01-18 | Payer: PRIVATE HEALTH INSURANCE

## 2022-01-17 DIAGNOSIS — C9100 Acute lymphoblastic leukemia not having achieved remission: Principal | ICD-10-CM

## 2022-01-17 LAB — CBC W/ AUTO DIFF
BASOPHILS ABSOLUTE COUNT: 0 10*9/L (ref 0.0–0.1)
BASOPHILS RELATIVE PERCENT: 0.5 %
EOSINOPHILS ABSOLUTE COUNT: 0 10*9/L (ref 0.0–0.5)
EOSINOPHILS RELATIVE PERCENT: 0 %
HEMATOCRIT: 21.3 % — ABNORMAL LOW (ref 34.0–44.0)
HEMOGLOBIN: 7.7 g/dL — ABNORMAL LOW (ref 11.3–14.9)
LYMPHOCYTES ABSOLUTE COUNT: 0.2 10*9/L — ABNORMAL LOW (ref 1.1–3.6)
LYMPHOCYTES RELATIVE PERCENT: 5.9 %
MEAN CORPUSCULAR HEMOGLOBIN CONC: 36.2 g/dL — ABNORMAL HIGH (ref 32.0–36.0)
MEAN CORPUSCULAR HEMOGLOBIN: 34 pg — ABNORMAL HIGH (ref 25.9–32.4)
MEAN CORPUSCULAR VOLUME: 94.1 fL (ref 77.6–95.7)
MEAN PLATELET VOLUME: 6.1 fL — ABNORMAL LOW (ref 6.8–10.7)
MONOCYTES ABSOLUTE COUNT: 0 10*9/L — ABNORMAL LOW (ref 0.3–0.8)
MONOCYTES RELATIVE PERCENT: 0.5 %
NEUTROPHILS ABSOLUTE COUNT: 2.5 10*9/L (ref 1.8–7.8)
NEUTROPHILS RELATIVE PERCENT: 93.1 %
PLATELET COUNT: 126 10*9/L — ABNORMAL LOW (ref 150–450)
RED BLOOD CELL COUNT: 2.26 10*12/L — ABNORMAL LOW (ref 3.95–5.13)
RED CELL DISTRIBUTION WIDTH: 21.4 % — ABNORMAL HIGH (ref 12.2–15.2)
WBC ADJUSTED: 2.7 10*9/L — ABNORMAL LOW (ref 3.6–11.2)

## 2022-01-17 LAB — SLIDE REVIEW

## 2022-01-17 MED ADMIN — heparin, porcine (PF) 100 unit/mL injection 500 Units: 500 [IU] | INTRAVENOUS | @ 22:00:00 | Stop: 2022-01-18

## 2022-01-17 MED ADMIN — diphenhydrAMINE (BENADRYL) capsule/tablet 25 mg: 25 mg | ORAL | @ 14:00:00 | Stop: 2022-01-17

## 2022-01-17 MED ADMIN — acetaminophen (TYLENOL) tablet 650 mg: 650 mg | ORAL | @ 14:00:00 | Stop: 2022-01-17

## 2022-01-17 MED ADMIN — sodium chloride (NS) 0.9 % infusion: 50 mL/h | INTRAVENOUS | @ 17:00:00

## 2022-01-17 MED ADMIN — riTUXimab-abbs (TRUXIMA) 731 mg in sodium chloride (NS) 0.9 % 250 mL rapid infusion: 375 mg/m2 | INTRAVENOUS | @ 20:00:00 | Stop: 2022-01-17

## 2022-01-17 MED ADMIN — pegfilgrastim-bmez (ZIEXTENZO) injection 6 mg: 6 mg | SUBCUTANEOUS | @ 22:00:00 | Stop: 2022-01-17

## 2022-01-17 NOTE — Unmapped (Signed)
Hgb 7.7, hematocrit 21.3. 2 units PRBC's per transfusion therapy plan transfused. Pt remained VSS, afebrile.     D7C3 Rituximab 731 mg infusion completed without complications. Line care provided with positive blood return; port flushed, heparinized per protocol, de-accessed.   Pt discharged from clinic in NAD, in stable condition, accompanied by family.

## 2022-01-17 NOTE — Unmapped (Signed)
Port accessed (medial lumen).  Labs drawn & sent for analysis.  To next appt.  Care provided by Jenean Lindau RN.

## 2022-01-20 MED ORDER — DEXAMETHASONE 4 MG TABLET
ORAL_TABLET | ORAL | 0 refills | 4 days | Status: CP
Start: 2022-01-20 — End: 2022-01-24
  Filled 2022-03-25: qty 40, 4d supply, fill #0

## 2022-01-21 ENCOUNTER — Encounter: Admit: 2022-01-21 | Discharge: 2022-01-22 | Payer: PRIVATE HEALTH INSURANCE

## 2022-01-21 ENCOUNTER — Other Ambulatory Visit: Admit: 2022-01-21 | Discharge: 2022-01-22 | Payer: PRIVATE HEALTH INSURANCE

## 2022-01-21 ENCOUNTER — Ambulatory Visit: Admit: 2022-01-21 | Discharge: 2022-01-22 | Payer: PRIVATE HEALTH INSURANCE

## 2022-01-21 DIAGNOSIS — C9100 Acute lymphoblastic leukemia not having achieved remission: Principal | ICD-10-CM

## 2022-01-21 LAB — CBC W/ AUTO DIFF
BASOPHILS ABSOLUTE COUNT: 0 10*9/L (ref 0.0–0.1)
BASOPHILS RELATIVE PERCENT: 1.2 %
EOSINOPHILS ABSOLUTE COUNT: 0 10*9/L (ref 0.0–0.5)
EOSINOPHILS RELATIVE PERCENT: 0.7 %
HEMATOCRIT: 26.2 % — ABNORMAL LOW (ref 34.0–44.0)
HEMOGLOBIN: 9.2 g/dL — ABNORMAL LOW (ref 11.3–14.9)
LYMPHOCYTES ABSOLUTE COUNT: 0.1 10*9/L — ABNORMAL LOW (ref 1.1–3.6)
LYMPHOCYTES RELATIVE PERCENT: 52 %
MEAN CORPUSCULAR HEMOGLOBIN CONC: 35 g/dL (ref 32.0–36.0)
MEAN CORPUSCULAR HEMOGLOBIN: 30.1 pg (ref 25.9–32.4)
MEAN CORPUSCULAR VOLUME: 86 fL (ref 77.6–95.7)
MEAN PLATELET VOLUME: 8.7 fL (ref 6.8–10.7)
MONOCYTES ABSOLUTE COUNT: 0 10*9/L — ABNORMAL LOW (ref 0.3–0.8)
MONOCYTES RELATIVE PERCENT: 15.6 %
NEUTROPHILS ABSOLUTE COUNT: 0 10*9/L — CL (ref 1.8–7.8)
NEUTROPHILS RELATIVE PERCENT: 30.5 %
PLATELET COUNT: 5 10*9/L — CL (ref 150–450)
RED BLOOD CELL COUNT: 3.04 10*12/L — ABNORMAL LOW (ref 3.95–5.13)
RED CELL DISTRIBUTION WIDTH: 18.4 % — ABNORMAL HIGH (ref 12.2–15.2)
WBC ADJUSTED: 0.1 10*9/L — CL (ref 3.6–11.2)

## 2022-01-21 LAB — BILIRUBIN, TOTAL: BILIRUBIN TOTAL: 0.7 mg/dL (ref 0.3–1.2)

## 2022-01-21 LAB — SLIDE REVIEW

## 2022-01-21 LAB — PLATELET COUNT: PLATELET COUNT: 36 10*9/L — ABNORMAL LOW (ref 150–450)

## 2022-01-21 MED ADMIN — vinCRIStine (ONCOVIN) 2 mg in sodium chloride (NS) 0.9 % 25 mL IVPB: 2 mg | INTRAVENOUS | @ 20:00:00 | Stop: 2022-01-21

## 2022-01-21 MED ADMIN — heparin, porcine (PF) 100 unit/mL injection 500 Units: 500 [IU] | INTRAVENOUS | @ 22:00:00 | Stop: 2022-01-22

## 2022-01-21 NOTE — Unmapped (Signed)
Brief Adult Oncology Infusion Clinic Note:    Paged re: [facility] Core Lab Critical Lab WBC=0.1/ ANC=0    Patricia [name] received Cycle 3 R-HyperCVAD inpatient 6/12-6/17 and is now in the nadir period. Vital signs are stable today. She is afebrile. Confirmed with patient she is to start Fluconazole and Levofloxacin today (given ANC < 0.5) She has both prescriptions on hand. She reports that Levofloxacin causes abdominal pain despite taking it with food, but she will try taking it in the evening after dinner. She will continue Valtrex daily. She will contact her primary oncology team if she has any concerns or does not tolerate Levofloxacin.       Montel Culver, ANP  ADVANCED PRACTICE PROVIDER FOR INFUSION PROGRAM

## 2022-01-21 NOTE — Unmapped (Unsigned)
Port accessed (medial lumen).  Labs drawn & sent for analysis.  To next appt.  Care provided by [facility] LPN.

## 2022-01-22 NOTE — Unmapped (Signed)
Pt arrived to infusion.   Port flushed, +BR. Labs witin parameters for treatment today.   1538: vincristine started  1548: vincristine complete. Pt tolerated it well.   One unit of platelets given   Post platelet count 36. Port flushed, +BR. Deaccessed. Pt discharged with no further needs.

## 2022-01-24 ENCOUNTER — Encounter
Admit: 2022-01-24 | Discharge: 2022-01-24 | Payer: PRIVATE HEALTH INSURANCE | Attending: Hematology | Primary: Hematology

## 2022-01-24 ENCOUNTER — Other Ambulatory Visit: Admit: 2022-01-24 | Discharge: 2022-01-24 | Payer: PRIVATE HEALTH INSURANCE

## 2022-01-24 ENCOUNTER — Ambulatory Visit: Admit: 2022-01-24 | Discharge: 2022-01-24 | Payer: PRIVATE HEALTH INSURANCE

## 2022-01-24 ENCOUNTER — Ambulatory Visit
Admit: 2022-01-24 | Discharge: 2022-01-24 | Payer: PRIVATE HEALTH INSURANCE | Attending: Registered" | Primary: Registered"

## 2022-01-24 DIAGNOSIS — C9100 Acute lymphoblastic leukemia not having achieved remission: Principal | ICD-10-CM

## 2022-01-24 LAB — SLIDE REVIEW

## 2022-01-24 LAB — CBC W/ AUTO DIFF
BASOPHILS ABSOLUTE COUNT: 0 10*9/L (ref 0.0–0.1)
BASOPHILS RELATIVE PERCENT: 0.1 %
EOSINOPHILS ABSOLUTE COUNT: 0 10*9/L (ref 0.0–0.5)
EOSINOPHILS RELATIVE PERCENT: 0 %
HEMATOCRIT: 24.1 % — ABNORMAL LOW (ref 34.0–44.0)
HEMOGLOBIN: 8.2 g/dL — ABNORMAL LOW (ref 11.3–14.9)
LYMPHOCYTES ABSOLUTE COUNT: 0.3 10*9/L — ABNORMAL LOW (ref 1.1–3.6)
LYMPHOCYTES RELATIVE PERCENT: 4.4 %
MEAN CORPUSCULAR HEMOGLOBIN CONC: 34.2 g/dL (ref 32.0–36.0)
MEAN CORPUSCULAR HEMOGLOBIN: 29.9 pg (ref 25.9–32.4)
MEAN CORPUSCULAR VOLUME: 87.4 fL (ref 77.6–95.7)
MEAN PLATELET VOLUME: 9.2 fL (ref 6.8–10.7)
MONOCYTES ABSOLUTE COUNT: 0.9 10*9/L — ABNORMAL HIGH (ref 0.3–0.8)
MONOCYTES RELATIVE PERCENT: 13.3 %
NEUTROPHILS ABSOLUTE COUNT: 5.7 10*9/L (ref 1.8–7.8)
NEUTROPHILS RELATIVE PERCENT: 82.2 %
PLATELET COUNT: 36 10*9/L — ABNORMAL LOW (ref 150–450)
RED BLOOD CELL COUNT: 2.75 10*12/L — ABNORMAL LOW (ref 3.95–5.13)
RED CELL DISTRIBUTION WIDTH: 16.8 % — ABNORMAL HIGH (ref 12.2–15.2)
WBC ADJUSTED: 7 10*9/L (ref 3.6–11.2)

## 2022-01-24 LAB — COMPREHENSIVE METABOLIC PANEL
ALBUMIN: 3.3 g/dL — ABNORMAL LOW (ref 3.4–5.0)
ALKALINE PHOSPHATASE: 53 U/L (ref 46–116)
ALT (SGPT): 55 U/L — ABNORMAL HIGH (ref 10–49)
ANION GAP: 11 mmol/L (ref 5–14)
AST (SGOT): 18 U/L (ref <=34)
BILIRUBIN TOTAL: 0.6 mg/dL (ref 0.3–1.2)
BLOOD UREA NITROGEN: 30 mg/dL — ABNORMAL HIGH (ref 9–23)
BUN / CREAT RATIO: 55
CALCIUM: 8.6 mg/dL — ABNORMAL LOW (ref 8.7–10.4)
CHLORIDE: 106 mmol/L (ref 98–107)
CO2: 24 mmol/L (ref 20.0–31.0)
CREATININE: 0.55 mg/dL — ABNORMAL LOW
EGFR CKD-EPI (2021) FEMALE: >90 mL/min/{1.73_m2} (ref >=60)
GLUCOSE RANDOM: 208 mg/dL — ABNORMAL HIGH (ref 70–179)
POTASSIUM: 3.9 mmol/L (ref 3.4–4.8)
PROTEIN TOTAL: 5.9 g/dL (ref 5.7–8.2)
SODIUM: 141 mmol/L (ref 135–145)

## 2022-01-24 LAB — LACTATE DEHYDROGENASE: LACTATE DEHYDROGENASE: 254 U/L — ABNORMAL HIGH (ref 120–246)

## 2022-01-24 LAB — SMEAR - BONE MARROW PATIENT

## 2022-01-24 MED ADMIN — heparin, porcine (PF) 100 unit/mL injection 500 Units: 500 [IU] | INTRAVENOUS | @ 14:00:00 | Stop: 2022-01-25

## 2022-01-24 MED ADMIN — LORazepam (ATIVAN) injection 1 mg: 1 mg | INTRAVENOUS | @ 14:00:00 | Stop: 2022-01-24

## 2022-01-24 NOTE — Unmapped (Signed)
Date of Service: 01/24/2022      Patient Active Problem List   Diagnosis    Leukocytosis    B-cell acute lymphoblastic leukemia (ALL) (CMS-HCC)    Acute cough    Dyspepsia    Acute frontal sinusitis       Indication:    Diagnosis ICD-10-CM Associated Orders   1. B-cell acute lymphoblastic leukemia (ALL) (CMS-HCC)  C91.00 Clinic Procedure Appointment Request     Clinic Procedure Appointment Request     LORazepam (ATIVAN) injection 1 mg     heparin, porcine (PF) 100 unit/mL injection 500 Units     Hematopathology Order     Hematopathology Order     Cytogenetics Cancer/FISH NON-BLOOD     Cytogenetics Cancer/FISH NON-BLOOD     B-ALL Minimal Residual Disease (MRD), Flow Cytometry, Bone Marrow     B-ALL Minimal Residual Disease (MRD), Flow Cytometry, Bone Marrow     BCR/ABL1 p190 Bone Marrow     BCR/ABL1 p190 Bone Marrow          Premedication: Ativan 1mg  IV  Driver confirmed: yes    Ordering Provider: [name], MD    Clinician(s) Performing Procedure: Ivor Costa. Kehaulani Fruin, PA-C        Bone Marrow Aspirate and Biopsy, right side    The risks, benefits, and alternatives to the procedure were discussed. All questions were answered. The patient verbalized understanding and signed informed consent. After a time-out in which his patient identifiers were checked by 2 providers, the patient was laid in prone position on the table.   The  posterior superior iliac spine and iliac crest were cleaned, prepped and draped in the usual sterile fashion.     Anesthetic agent used: 2% plain lidocaine.      Utilizing a Ranfac needle, a bone marrow aspiration and biopsy was performed.  Specimen was sent for routine histopathologic stains and sectioning, flow cytometry, cytogenetics, and molecular analysis.     A pressure dressing was applied to the biopsy site.  Patient tolerated the procedure well.  Hemostasis was confirmed upon discharge.     The patient was given verbal instructions for wound care, such as to keep the biopsy site dry, covered for 24 hours, and to call your physician for a temperature > 100.5.  Tylenol may be taken for discomfort.    Specimens Collected:  EDTA x 2   Heparin x 2  Core biopsy x 1

## 2022-01-24 NOTE — Unmapped (Signed)
Encounter addended by: Irene Shipper on: 01/24/2022 3:08 PM   Actions taken: Test resulted

## 2022-01-24 NOTE — Unmapped (Signed)
Please leave dressing in place and keep it dry for 24 hrs before removing. You can resume normal activities tomorrow, but take things easy today.     You may take tylenol as needed for discomfort at the area where the biopsy was taken.   After receiving Ativan, please do not drive today.     If you have questions between 8am to 5 pm Monday through Friday please call [phone] and speak to the operator.      For emergencies, evenings or weekends, please call [phone] and ask for oncology fellow on call.     Reasons to call emergency line may include:   Fever of 100.5 or greater   Nausea and/or vomiting not relieved with nausea medicine   Diarrhea or constipation   Severe pain not relieved with usual pain regimen       Lab on 01/24/2022   Component Date Value Ref Range Status    Sodium 01/24/2022 141  135 - 145 mmol/L Final    Potassium 01/24/2022 3.9  3.4 - 4.8 mmol/L Final    Chloride 01/24/2022 106  98 - 107 mmol/L Final    CO2 01/24/2022 24.0  20.0 - 31.0 mmol/L Final    Anion Gap 01/24/2022 11  5 - 14 mmol/L Final    BUN 01/24/2022 30 (H)  9 - 23 mg/dL Final    Creatinine 29/56/2130 0.55 (L)  0.60 - 0.80 mg/dL Final    BUN/Creatinine Ratio 01/24/2022 55   Final    eGFR CKD-EPI (2021) Female 01/24/2022 >90  >=60 mL/min/1.58m2 Final    eGFR calculated with CKD-EPI 2021 equation in accordance with [employer] and [employer] of Nephrology Task Force recommendations.    Glucose 01/24/2022 208 (H)  70 - 179 mg/dL Final    Calcium 86/57/8469 8.6 (L)  8.7 - 10.4 mg/dL Final    Albumin 62/95/2841 3.3 (L)  3.4 - 5.0 g/dL Final    Total Protein 01/24/2022 5.9  5.7 - 8.2 g/dL Final    Total Bilirubin 01/24/2022 0.6  0.3 - 1.2 mg/dL Final    AST 32/44/0102 18  <=34 U/L Final    ALT 01/24/2022 55 (H)  10 - 49 U/L Final    Alkaline Phosphatase 01/24/2022 53  46 - 116 U/L Final    LDH 01/24/2022 254 (H)  120 - 246 U/L Final    WBC 01/24/2022 7.0  3.6 - 11.2 10*9/L Preliminary    RBC 01/24/2022 2.75 (L)  3.95 - 5.13 10*12/L Preliminary    HGB 01/24/2022 8.2 (L)  11.3 - 14.9 g/dL Preliminary    HCT 72/53/6644 24.1 (L)  34.0 - 44.0 % Preliminary    MCV 01/24/2022 87.4  77.6 - 95.7 fL Preliminary    MCH 01/24/2022 29.9  25.9 - 32.4 pg Preliminary    MCHC 01/24/2022 34.2  32.0 - 36.0 g/dL Preliminary    RDW 03/47/4259 16.8 (H)  12.2 - 15.2 % Preliminary    MPV 01/24/2022 9.2  6.8 - 10.7 fL Preliminary    Platelet 01/24/2022 36 (L)  150 - 450 10*9/L Preliminary    Anisocytosis 01/24/2022 Slight (A)  Not Present Preliminary

## 2022-01-24 NOTE — Unmapped (Signed)
Patient arrived for BMBx. Time out completed and consent verified. Meds given as ordered, driver for patient confirmed. Port hep locked and deaccessed. Patient tolerated procedure well and was discharged by APP.

## 2022-01-24 NOTE — Unmapped (Signed)
Encounter addended by: Irene Shipper on: 01/24/2022 2:59 PM   Actions taken: Visit diagnoses modified, Order list changed, Diagnosis association updated

## 2022-01-24 NOTE — Unmapped (Unsigned)
[facility] Outpatient Nutrition Services   Medical Nutrition Therapy Consultation       Visit Type:    Return Assessment    Referral Reason:   GI upset      Patricia [name] is a 46 y.o. female seen for medical nutrition therapy for GI upset in context of cancer treatment. Pt with newly diagnosed Ph+ B-ALL; received cycle 3 R-HyperCVAD inpatient 6/12-6/17.  Her active problem list, medication list, allergies, family history, social history, notes from last a few encounters, and lab results were reviewed.     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     Anthropometrics   Height: 172.7 cm  Current Weight: 81.2 kg  BMI: 27.23 kg/m2  Usual Body Weight: 185 lb (84.1 kg)  % Usual Body Weight: 97%  Ideal Body Weight: 61.4 kg (Hamwi Method)  % Ideal Body Weight: 132%  Recent wt changes: no change over 1 month    Weight history:  Wt Readings from Last 6 Encounters:   01/21/22 81.2 kg (179 lb 1.6 oz)   01/17/22 81.7 kg (180 lb 1.6 oz)   01/14/22 85.6 kg (188 lb 11.2 oz)   01/06/22 81 kg (178 lb 9.2 oz)   12/30/21 80.4 kg (177 lb 4 oz)   12/28/21 81.7 kg (180 lb 3.2 oz)       Nutrition Risk Screening:   Food Insecurity: No Food Insecurity    Worried About Programme researcher, broadcasting/film/video in the Last Year: Never true    Ran Out of Food in the Last Year: Never true        Nutrition-Focused Physical Findings   Overall Appearance: Nourished  Skin:  Intact    Physical Activity:  Physical activity level is light with some exercise.     ASPEN/AND Malnutrition Screening:   Nutrition-Focused Physical Exam not indicated due to lack of malnutrition risk factors.      Biochemical Data, Medical Tests and Procedures:  All pertinent labs and imaging reviewed by Howie Ill at 1:00 PM 01/24/2022.    ANC wnl today (0.0 10*9/L on 01/21/22), BUN/Cr 55    Medications and Vitamin/Mineral Supplementation:   All nutritionally pertinent medications reviewed on 01/24/2022.   She is not taking nutrition supplements.     Current Outpatient Medications   Medication Sig Dispense Refill    acetaminophen (TYLENOL 8 HOUR) 650 MG CR tablet Take 1 tablet (650 mg total) by mouth every eight (8) hours as needed for pain.      cetirizine (ZYRTEC) 10 MG tablet Take 1 tablet (10 mg total) by mouth nightly. 30 tablet 2    dapsone 100 MG tablet Take 1 tablet (100 mg total) by mouth daily. 30 tablet 2    dasatinib (SPRYCEL) 70 MG tablet Take 1 tablet (70 mg total) by mouth daily. 30 tablet 7    dexAMETHasone (DECADRON) 4 MG tablet Take 10 tablets (40 mg total) by mouth daily for 4 days. 40 tablet 0    escitalopram oxalate (LEXAPRO) 10 MG tablet Take 1 tablet (10 mg total) by mouth daily. 90 tablet 0    fluconazole (DIFLUCAN) 200 MG tablet Take 1 tablet (200 mg total) by mouth daily. 30 tablet 0    guaiFENesin (MUCINEX) 600 mg 12 hr tablet Take 1 tablet (600 mg total) by mouth two (2) times a day as needed for congestion. 60 tablet 0    levalbuterol (XOPENEX) 0.31 mg/3 mL  nebulizer solution Inhale 3 mL (0.31 mg total) by nebulization every four (4) hours as needed for wheezing (cough). 45 mL 0    levoFLOXacin (LEVAQUIN) 500 MG tablet Take 1 tablet (500 mg total) by mouth daily. 30 tablet 0    nifedipine 0.3% lidocaine 1.5% in petrolatum ointment Apply 1 application. topically daily. 100 g 0    prochlorperazine (COMPAZINE) 10 MG tablet Take 1 tablet (10 mg total) by mouth every six (6) hours as needed for up to 14 days. 30 tablet 0    valACYclovir (VALTREX) 500 MG tablet Take 1 tablet (500 mg total) by mouth daily. 90 tablet 3     No current facility-administered medications for this visit.     Facility-Administered Medications Ordered in Other Visits   Medication Dose Route Frequency Provider Last Rate Last Admin    heparin, porcine (PF) 100 unit/mL injection 500 Units  500 Units Intravenous Q30 Min PRN [name], MD   500 Units at 01/24/22 3329       Nutrition History:     Dietary Restrictions: No known food allergies. She noted food intolerance to dairy- milk. Reduced intake of acidic foods such as tomato sauce to manage reflux.    Gastrointestinal Issues: Constipation- having BM daily or every other day. Struggling with anal fissure, hemorrhoids. Managing constipation with apple juice.    Oral Issues: experiencing cotton mouth    Hunger and Satiety:  Decreased appetite    Food Safety and Access: She did not report issues.       Diet Recall:   Breakfast: Cheerios with almond milk and bananas or bagel with peanut butter  Lunch: may snack  Dinner: lasagna with meat, frozen pizza, [employer] frozen meals  Snack(s): [place] snack pack, pudding  Beverages: 32-48 oz water, ~ 24 oz juice (apple, cranberry, V8)   Alcohol: none reported  Supplements: Ensure Plus High Protein X 1-2 daily, occasional Carnation Breakfast Essentials mixed with almond milk  Enteral feedings: no feeding tube    Pt states meals may be hit or miss based on decreased appetite. Prefers drinking juice due to taste.  Nutrition Assessment         Daily Estimated Nutritional Needs:  Energy: RMR X 1.2-1.3 AF ([place] Jeor, actual wt)  Protein: 1-1.2 g/kg (actual wt)  Carbohydrate: no restriction  Fluid: 1 ml/kcal  Nutrition Goals & Evaluation    - Prevent >= 1% wt loss per week.  (Met)  - Meet >= 85% estimated nutrition needs daily.  (Progressing)  - Adequate hydration.  (Progressing)      Nutrition goals reviewed, and relevant barriers identified and addressed: none evident. She is evaluated to have excellent willingness and ability to achieve nutrition goals.     Nutrition Intervention      - Nutrition Counseling: Goal setting Problem solving  - Oral Nutrition Supplementation: Ensure Plus High Protein    Nutrition Plan:   Recommend small, frequent meals/snacks including high calorie/high protein foods. Reviewed sources of protein. Discussed easy-to-prepare meals/snacks such as pre-made tuna salad or peanut butter and crackers.  Recommend 8-10 cup fluids daily. May incorporate alternative fluids such as ice pops, herbal/decaf teas, broths.    Recommend Ensure Plus High Protein X 2-3 daily, or as needed.      Materials Provided: Food Safety      Follow up will occur as needed.       Food/Nutrition-related history, Anthropometric measurements, Biochemical data, medical tests, procedures, Nutrition-focused physical findings, Patient understanding or  compliance with intervention and recommendations , and Effectiveness of nutrition interventions will be assessed at time of follow-up.     Recommendations for Clinical Team, Care Team , and Referring Provider :  - Encourage pt to follow nutrition plan as noted above.     Time spent 45 minutes.

## 2022-01-25 NOTE — Unmapped (Signed)
[facility] Leukemia Clinic Follow Up Visit Note     [name]  Patient Age: 46 y.o.  Encounter Date: 01/27/2022    Primary Care Provider:  [name], MD    Referring Physician:  [name], MD  [address]  [place],  [place] [address]    Assessment/Plan:  DEANNIE RESETAR is a 46 y.o. female with past medical history of chronic right shoulder and back pain, hypertension, anxiety, and recently diagnosed Ph+ ALL. S/p induction on GRAAPH-2005 bone marrow biopsy demonstrated morphologic CR, BCR/ABL p190 2/100,000.    Disease specific prognosis: It remains unclear whether or not Ph+ALL patients can be cured with chemotherapy alone plus TKI. R-Hyper-CVAD+dasatinib offers median DFS 31 mo and median OS 47 mo (Ravandi, Cancer 2015).     Prognosis is highly dependant upon achieving CMR. For patients who achieve CMR (neg by pcr) at 3 months, risk of relapse at 4 years is about 25% (Short, Blood 2016).     She is now s/p Cycle 3 R-HyperCVAD+ dasatinib. Her post-C3 bmbx demonstrates no molecular evidence of disease (p190 negative). This is very good news. Risk of relapse at 4 years is about 25%. We will continue on with hyperCVAD.     Ph+ALL, in remission: On GRAAPH 2005 (per Pollyann Kennedy, et al. Randomized study of reduced intensity chemotherapy combined with imatinib in adults with Ph-positive ALL. Blood 2015 Jun 11;125(24):3711-9) with dasatinib for imatinib. This is C1 high dose TKI+Dex+Vincristine to dose intensification with (R)-HyperCVAD/MA (starting with MA, I.e. Part B or Even cycles).  TKI is continued indefinitely with hopes for at least 2 years.  She has partial (51%) CD20 expression.  Therefore we will proceed with rituximab in addition to hyperCVAD.  - See treatment timeline in Oncology history    -Plan Cycle 4 B cycle R-HyperCVAD/MA+dasatinib   - 1 IT per cycle - did not tolerate 2 per cycle  - Dasatinib 70 mg     - Next bone marrow biopsy following cycle 8  - Discontinue G-CSF from her next treatment cycles given persistent leukocytosis and malaise with this. Can re-evaluate if develops infectious complications.     Leukocytosis, neutrophil predominance:   - Due to G-CSF     Hemorrhoids, Anal fissure: Very painful despite supportive care meds  - Recommend having GI surgery see her when admitted to provide further supportive care recommendations. I don't think that surgery would be warranted given her ongoing chemotherapy, though would like to have a concrete plan going forward.     Pancytopenia due to chemotherapy  - counts currently recovered  - supportive care as detailed below when in nadir     Psychosocial distress: She reports a moderate level of anxiety regarding the management of the above.   - Counseling given   - Consider ref to comprehensive cancer support program     Patient-centered care/Shared decision-making:   We discussed the plan above at length. The patient and her husband actively contributed to the conversation. Specifically, her most important outcomes are:   Prolonging overall survival and Maintaining her overall functional activity     Supportive Care Needs: We recommend based on the patient???s underlying diagnosis and treatment history the following supportive care:    1. Antimicrobial prophylaxis:  ALL (in remission, receiving post-remission therapy): Bacterial: Levofloxacin 500mg  PO daily (when absolute neutrophils </= 0.5);   Fungal: Fluconazole 200mg  PO daily (when absolute neutrophils </= 0.5);   Viral: Valacyclovir 500mg  PO daily (  Continuous);   PJP:  SMX/TMP DS PO BID twice weekly (Sat/Sun)    2. Blood product support:  Leukoreduced blood products are required.  Irradiated blood products are preferred, but in case of urgent transfusion needs non-irradiated blood products may be used: 2 units for Hg <= 7.0.     Coordination of care:   - Admission for C4 in a week or so    - Recommend GI surgery consult when she is admitted for evaluation for anal fissure [name], MD  Leukemia Program  Division of Hematology  [facility]      Nurse Navigator (non-clinical trial patients): Elicia Lamp, RN        Tel. [phone]       Fax. [phone]  Toll-free appointments: [phone]  Scheduling assistance: [phone]  After hours/weekends: [phone] (ask for adult hematology/oncology on-call)      History of Present Illness:   Patricia [name] is a 46 y.o. female with past medical history noted as above who presents for follow up Ph+ ALL.      Her social history is notable for being a Runner, broadcasting/film/video.  She was recently married. She lives with her husband.    Interim history  Feeling terrible today with malaise, lightheadedness, chills, blurry vision. No fevers. Cough improved from prior. Still reporting lots of pain from hemorrhoids/anal fissure.     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 The majority of the abnormal cells (64/88) contained an additional BCR/ABL1 fusion signal, while 9/88 abnormal cells contained an additional ABL1 and ASS1 signal.       Genetics:   Karyotype/FISH:   RESULTS   Date Value Ref Range Status   10/28/2021   Final    NOTE: This report reflects a combined study from a peripheral blood and a bone marrow core biopsy. Eleven cells from the peripheral blood and nine cells from the bone marrow core biopsy were analyzed. The BCR/ABL1 FISH analysis was performed on the peripheral blood. Abnormal Karyotype:  47,XX,t(9;22)(q34;q11.2),+der(22)t(9;22)[18]/46,XX[2]    Abnormal FISH:  A BCR/ABL1 interphase FISH assay showed a signal pattern consistent with a BCR::ABL1 rearrangement and the 9;22 translocation in 88% of the 100 cells scored. The majority of the abnormal cells (64/88) contained an additional BCR/ABL1 fusion signal, while 9/88 abnormal cells contained an additional ABL1 and ASS1 signal.           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  MRD by flow insufficient, p190 2/100,000  12/10/2021: Cycle 2 B cycle + rituximab  12/14/21: IT#4  12/17/21: IT#5  01/10/22: Cycle 3 A cycle + rituximab  01/24/22: Bmbx - MRD-neg by PCR (p190)   ~02/09/22: PLAN C4  B-cell acute lymphoblastic leukemia (ALL) (CMS-HCC)   10/29/2021 Initial Diagnosis    B-cell acute lymphoblastic leukemia (ALL) (CMS-HCC)       10/30/2021 -  Chemotherapy    IP/OP LEUKEMIA GRAAPH-2005 < 60 YO (OP PEGFILGRASTIM ON DAY 7)  [No description for this plan]       01/06/2022 Endocrine/Hormone Therapy    OP LEUPROLIDE (LUPRON) 11.25 MG EVERY 3 MONTHS  Plan Provider: [name], MD           Review of Systems:   ROS reviewed and negative except as noted in H and P     Allergies:  Allergies   Allergen Reactions    Erythromycin Hives    Other      Pt cant take any pain reliever or fever reducer due to condition.    Sulfa (Sulfonamide Antibiotics) Anaphylaxis    Azithromycin        Medications:     Current Outpatient Medications:     acetaminophen (TYLENOL 8 HOUR) 650 MG CR tablet, Take 1 tablet (650 mg total) by mouth every eight (8) hours as needed for pain., Disp: , Rfl:     cetirizine (ZYRTEC) 10 MG tablet, Take 1 tablet (10 mg total) by mouth nightly., Disp: 30 tablet, Rfl: 2    dapsone 100 MG tablet, Take 1 tablet (100 mg total) by mouth daily., Disp: 30 tablet, Rfl: 2    dasatinib (SPRYCEL) 70 MG tablet, Take 1 tablet (70 mg total) by mouth daily., Disp: 30 tablet, Rfl: 7    escitalopram oxalate (LEXAPRO) 10 MG tablet, Take 1 tablet (10 mg total) by mouth daily., Disp: 90 tablet, Rfl: 0    levalbuterol (XOPENEX) 0.31 mg/3 mL nebulizer solution, Inhale 3 mL (0.31 mg total) by nebulization every four (4) hours as needed for wheezing (cough)., Disp: 45 mL, Rfl: 0    nifedipine 0.3% lidocaine 1.5% in petrolatum ointment, Apply 1 application. topically daily., Disp: 100 g, Rfl: 0    prochlorperazine (COMPAZINE) 10 MG tablet, Take 1 tablet (10 mg total) by mouth every six (6) hours as needed for up to 14 days., Disp: 30 tablet, Rfl: 0    valACYclovir (VALTREX) 500 MG tablet, Take 1 tablet (500 mg total) by mouth daily., Disp: 90 tablet, Rfl: 3    hydrocortisone 1 % cream, Apply to affected area 2 times daily, Disp: 30 g, Rfl: 1    Medical History:  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       Social History:  Social History     Social History Narrative    Not on file       Family History:  No family history on file.    Objective:   BP 112/75  - Pulse 116  - Temp 36.8 ??C (98.2 ??F) (Temporal)  - Resp 18  - Ht 172.7 cm (5' 8)  - Wt 81.4 kg (179 lb 7.3 oz)  - SpO2 97%  - BMI 27.29 kg/m??     Physical Exam:  General: Resting in no apparent distress, accompanied by husband  HEENT:  Clear sclera, conjunctiva, mask in place  CARDAC: RRR, no R,M,Gs, no pitting peripheral edema  RESP: nonlabored, CTAB  GI: Soft, nontender, no hepatic or splenomegaly  NEURO: alert, 0x4, steady gait, no focal deficits  PSYCH: appropriate  DERM: no visible rashes, lesions  LINE: PORT - accessed, CDI      Test Results:  No  results found for this or any previous visit (from the past 24 hour(s)).    01/27/2022 CBC/d and CMP reviewed with patient

## 2022-01-27 ENCOUNTER — Other Ambulatory Visit: Admit: 2022-01-27 | Discharge: 2022-01-27 | Payer: PRIVATE HEALTH INSURANCE

## 2022-01-27 ENCOUNTER — Ambulatory Visit
Admit: 2022-01-27 | Discharge: 2022-01-27 | Payer: PRIVATE HEALTH INSURANCE | Attending: Hematology | Primary: Hematology

## 2022-01-27 ENCOUNTER — Ambulatory Visit: Admit: 2022-01-27 | Discharge: 2022-01-27 | Payer: PRIVATE HEALTH INSURANCE

## 2022-01-27 DIAGNOSIS — C9100 Acute lymphoblastic leukemia not having achieved remission: Principal | ICD-10-CM

## 2022-01-27 LAB — CBC W/ AUTO DIFF
BASOPHILS ABSOLUTE COUNT: 0.2 10*9/L — ABNORMAL HIGH (ref 0.0–0.1)
BASOPHILS RELATIVE PERCENT: 0.5 %
EOSINOPHILS ABSOLUTE COUNT: 0 10*9/L (ref 0.0–0.5)
EOSINOPHILS RELATIVE PERCENT: 0 %
HEMATOCRIT: 25.6 % — ABNORMAL LOW (ref 34.0–44.0)
HEMOGLOBIN: 8.4 g/dL — ABNORMAL LOW (ref 11.3–14.9)
LYMPHOCYTES ABSOLUTE COUNT: 1.8 10*9/L (ref 1.1–3.6)
LYMPHOCYTES RELATIVE PERCENT: 3.4 %
MEAN CORPUSCULAR HEMOGLOBIN CONC: 32.7 g/dL (ref 32.0–36.0)
MEAN CORPUSCULAR HEMOGLOBIN: 28.6 pg (ref 25.9–32.4)
MEAN CORPUSCULAR VOLUME: 87.6 fL (ref 77.6–95.7)
MEAN PLATELET VOLUME: 7.9 fL (ref 6.8–10.7)
MONOCYTES ABSOLUTE COUNT: 4.9 10*9/L — ABNORMAL HIGH (ref 0.3–0.8)
MONOCYTES RELATIVE PERCENT: 9.3 %
NEUTROPHILS ABSOLUTE COUNT: 45.9 10*9/L — ABNORMAL HIGH (ref 1.8–7.8)
NEUTROPHILS RELATIVE PERCENT: 86.8 %
PLATELET COUNT: 174 10*9/L (ref 150–450)
RED BLOOD CELL COUNT: 2.92 10*12/L — ABNORMAL LOW (ref 3.95–5.13)
RED CELL DISTRIBUTION WIDTH: 18.6 % — ABNORMAL HIGH (ref 12.2–15.2)
WBC ADJUSTED: 52.9 10*9/L (ref 3.6–11.2)

## 2022-01-27 LAB — COMPREHENSIVE METABOLIC PANEL
ALBUMIN: 3.3 g/dL — ABNORMAL LOW (ref 3.4–5.0)
ALKALINE PHOSPHATASE: 129 U/L — ABNORMAL HIGH (ref 46–116)
ALT (SGPT): 53 U/L — ABNORMAL HIGH (ref 10–49)
ANION GAP: 6 mmol/L (ref 5–14)
AST (SGOT): 35 U/L — ABNORMAL HIGH (ref <=34)
BILIRUBIN TOTAL: 0.4 mg/dL (ref 0.3–1.2)
BLOOD UREA NITROGEN: 16 mg/dL (ref 9–23)
BUN / CREAT RATIO: 22
CALCIUM: 8.5 mg/dL — ABNORMAL LOW (ref 8.7–10.4)
CHLORIDE: 105 mmol/L (ref 98–107)
CO2: 28 mmol/L (ref 20.0–31.0)
CREATININE: 0.74 mg/dL
EGFR CKD-EPI (2021) FEMALE: >90 mL/min/{1.73_m2} (ref >=60)
GLUCOSE RANDOM: 172 mg/dL (ref 70–179)
POTASSIUM: 3.8 mmol/L (ref 3.4–4.8)
PROTEIN TOTAL: 5.7 g/dL (ref 5.7–8.2)
SODIUM: 139 mmol/L (ref 135–145)

## 2022-01-27 LAB — SLIDE REVIEW

## 2022-01-27 LAB — LACTATE DEHYDROGENASE: LACTATE DEHYDROGENASE: 535 U/L — ABNORMAL HIGH (ref 120–246)

## 2022-01-27 MED ADMIN — alteplase (ACTIVASE) injection small catheter clearance 2 mg: 2 mg | INTRAVENOUS | @ 19:00:00 | Stop: 2022-01-27

## 2022-01-27 NOTE — Unmapped (Signed)
Patient stated that she have been exhausted all week . She feel like she did when she first got diagnosed. She could not poop. Her stomach is in pain at level 6.

## 2022-01-27 NOTE — Unmapped (Signed)
.  Hi,     [employer]  contacted the [employer] requesting to speak with the care team of YANILEN ADAMIK to discuss:    He is calling requesting the license # for Dr Senaida Ores he stated he received a form that was signed by him but there was no license # or employee #.     Please contact him  back at [phone].        Thank you,   Noland Fordyce  [facility]   [phone]

## 2022-01-27 NOTE — Unmapped (Signed)
PT arrived to clinic with TPA in port, port checked and TPA removed, blood return brisk once PT turned head away from port and coughed. Port was  heparin locked and de-accessed, PT did not need transfusion today, infusion was called and made away.

## 2022-01-28 DIAGNOSIS — C9100 Acute lymphoblastic leukemia not having achieved remission: Principal | ICD-10-CM

## 2022-01-28 NOTE — Unmapped (Signed)
Hello,    Please scheduled patient Patricia [name] on 7/10 for a SCHED ADM THROUGH INF     ?? Reason for Admission: Scheduled chemotherapy HyperCVaD  ?? Primary Diagnosis:  Encounter for antineoplastic chemotherapy for ALL  ?? Primary Diagnosis ICD-10 Code:  Z51.11 & C91.00  ?? Expected length of stay: 3-5 Days   ?? CPT Code:  16109   ?? CPT Code Description: Under Injection and Intravenous Infusion Chemotherapy and Other Highly Complex Drug or Highly Complex Biologic Agent Administration   ?? Treating Attending: Dr. Barbette Merino  ?? Need for PICC placement in Infusion? No    Infusion Scheduling: Please notify the patient of the appointment date and time once scheduled. Please document this information within this encounter and then route to the navigator prior to closing the encounter.       Scheduled Admissions Assessment        Assessment  Response  Intervention    Type of insurance  Private/ACA/Employer  Financial counselor referral is not warranted this time.     Reliable Trasportation  Yes Referral for Social Work is not warranted this time.   Central Line Access  Yes Patient has line access and does not require an intervention at this time     Frederik Pear

## 2022-01-31 ENCOUNTER — Ambulatory Visit: Admit: 2022-01-31 | Discharge: 2022-02-01 | Payer: PRIVATE HEALTH INSURANCE

## 2022-01-31 ENCOUNTER — Encounter: Admit: 2022-01-31 | Discharge: 2022-02-01 | Payer: PRIVATE HEALTH INSURANCE

## 2022-01-31 LAB — CBC W/ AUTO DIFF
BASOPHILS ABSOLUTE COUNT: 0.1 10*9/L (ref 0.0–0.1)
BASOPHILS RELATIVE PERCENT: 0.4 %
EOSINOPHILS ABSOLUTE COUNT: 0 10*9/L (ref 0.0–0.5)
EOSINOPHILS RELATIVE PERCENT: 0 %
HEMATOCRIT: 20 % — ABNORMAL LOW (ref 34.0–44.0)
HEMOGLOBIN: 6.7 g/dL — ABNORMAL LOW (ref 11.3–14.9)
LYMPHOCYTES ABSOLUTE COUNT: 2.3 10*9/L (ref 1.1–3.6)
LYMPHOCYTES RELATIVE PERCENT: 8.3 %
MEAN CORPUSCULAR HEMOGLOBIN CONC: 33.3 g/dL (ref 32.0–36.0)
MEAN CORPUSCULAR HEMOGLOBIN: 30.5 pg (ref 25.9–32.4)
MEAN CORPUSCULAR VOLUME: 91.8 fL (ref 77.6–95.7)
MEAN PLATELET VOLUME: 7.7 fL (ref 6.8–10.7)
MONOCYTES ABSOLUTE COUNT: 3.5 10*9/L — ABNORMAL HIGH (ref 0.3–0.8)
MONOCYTES RELATIVE PERCENT: 12.7 %
NEUTROPHILS ABSOLUTE COUNT: 21.9 10*9/L — ABNORMAL HIGH (ref 1.8–7.8)
NEUTROPHILS RELATIVE PERCENT: 78.6 %
PLATELET COUNT: 290 10*9/L (ref 150–450)
RED BLOOD CELL COUNT: 2.18 10*12/L — ABNORMAL LOW (ref 3.95–5.13)
RED CELL DISTRIBUTION WIDTH: 19.8 % — ABNORMAL HIGH (ref 12.2–15.2)
WBC ADJUSTED: 27.9 10*9/L — ABNORMAL HIGH (ref 3.6–11.2)

## 2022-01-31 LAB — COMPREHENSIVE METABOLIC PANEL
ALBUMIN: 3.2 g/dL — ABNORMAL LOW (ref 3.4–5.0)
ALKALINE PHOSPHATASE: 130 U/L — ABNORMAL HIGH (ref 46–116)
ALT (SGPT): 68 U/L — ABNORMAL HIGH (ref 10–49)
ANION GAP: 7 mmol/L (ref 5–14)
AST (SGOT): 35 U/L — ABNORMAL HIGH (ref <=34)
BILIRUBIN TOTAL: 0.7 mg/dL (ref 0.3–1.2)
BLOOD UREA NITROGEN: 12 mg/dL (ref 9–23)
BUN / CREAT RATIO: 24
CALCIUM: 8.4 mg/dL — ABNORMAL LOW (ref 8.7–10.4)
CHLORIDE: 109 mmol/L — ABNORMAL HIGH (ref 98–107)
CO2: 27 mmol/L (ref 20.0–31.0)
CREATININE: 0.49 mg/dL — ABNORMAL LOW
EGFR CKD-EPI (2021) FEMALE: >90 mL/min/{1.73_m2} (ref >=60)
GLUCOSE RANDOM: 178 mg/dL (ref 70–179)
POTASSIUM: 3.9 mmol/L (ref 3.4–4.8)
PROTEIN TOTAL: 5.5 g/dL — ABNORMAL LOW (ref 5.7–8.2)
SODIUM: 143 mmol/L (ref 135–145)

## 2022-01-31 LAB — LACTATE DEHYDROGENASE: LACTATE DEHYDROGENASE: 566 U/L — ABNORMAL HIGH (ref 120–246)

## 2022-01-31 MED ORDER — HYDROCORTISONE 1 % TOPICAL CREAM
1 refills | 0 days | Status: CP
Start: 2022-01-31 — End: 2023-01-31

## 2022-01-31 MED ADMIN — sodium chloride (NS) 0.9 % infusion: 50 mL/h | INTRAVENOUS | @ 20:00:00

## 2022-01-31 MED ADMIN — heparin, porcine (PF) 100 unit/mL injection 500 Units: 500 [IU] | INTRAVENOUS | @ 23:00:00 | Stop: 2022-02-01

## 2022-01-31 NOTE — Unmapped (Signed)
13:00 - pt arrives for [facility] labs with count check for possible transfusion. Pt states feeling ok besides fatigue - pt denies any fever/chills. Medial Port accessed via sterile technique and labs with T&S drawn and sent.   13:45 - pt made aware Hgb 6.7 so awaiting T&S with crossmatch to transfuse 2 units PRBC. Pt has consent for blood transfusion 01/17/22  18:40 - pt tolerated and completed 2 units PRBC transfusion with VSS and Port flushed and de-accessed per protocol. Pt declined AVS as checks online chart and ambulated with stable gait accompanied by her partner in stable condition.

## 2022-02-02 DIAGNOSIS — C9100 Acute lymphoblastic leukemia not having achieved remission: Principal | ICD-10-CM

## 2022-02-02 NOTE — Unmapped (Signed)
Hello,    Please scheduled patient Patricia [name] on 7/12 for a SCHED ADM THROUGH INF     ?? Reason for Admission: Scheduled chemotherapy HyperCVaD  ?? Primary Diagnosis:  Encounter for antineoplastic chemotherapy for ALL  ?? Primary Diagnosis ICD-10 Code:  Z51.11 & C91.00  ?? Expected length of stay: 3-5 Days   ?? CPT Code:  16109   ?? CPT Code Description: Under Injection and Intravenous Infusion Chemotherapy and Other Highly Complex Drug or Highly Complex Biologic Agent Administration   ?? Treating Attending: Dr. Barbette Merino  ?? Need for PICC placement in Infusion? No    Infusion Scheduling: Please notify the patient of the appointment date and time once scheduled. Please document this information within this encounter and then route to the navigator prior to closing the encounter.       Scheduled Admissions Assessment        Assessment  Response  Intervention    Type of insurance  Private/ACA/Employer  Financial counselor referral is not warranted this time.     Reliable Trasportation  Yes Referral for Social Work is not warranted this time.   Central Line Access  Yes Patient has line access and does not require an intervention at this time       Thank you,  Frederik Pear

## 2022-02-04 ENCOUNTER — Ambulatory Visit: Admit: 2022-02-04 | Discharge: 2022-02-04 | Payer: PRIVATE HEALTH INSURANCE

## 2022-02-04 ENCOUNTER — Encounter: Admit: 2022-02-04 | Discharge: 2022-02-04 | Payer: PRIVATE HEALTH INSURANCE

## 2022-02-04 LAB — SLIDE REVIEW

## 2022-02-04 LAB — CBC W/ AUTO DIFF
BASOPHILS ABSOLUTE COUNT: 0.1 10*9/L (ref 0.0–0.1)
BASOPHILS RELATIVE PERCENT: 0.8 %
EOSINOPHILS ABSOLUTE COUNT: 0 10*9/L (ref 0.0–0.5)
EOSINOPHILS RELATIVE PERCENT: 0 %
HEMATOCRIT: 27.8 % — ABNORMAL LOW (ref 34.0–44.0)
HEMOGLOBIN: 9.3 g/dL — ABNORMAL LOW (ref 11.3–14.9)
LYMPHOCYTES ABSOLUTE COUNT: 2.5 10*9/L (ref 1.1–3.6)
LYMPHOCYTES RELATIVE PERCENT: 17 %
MEAN CORPUSCULAR HEMOGLOBIN CONC: 33.6 g/dL (ref 32.0–36.0)
MEAN CORPUSCULAR HEMOGLOBIN: 30.7 pg (ref 25.9–32.4)
MEAN CORPUSCULAR VOLUME: 91.5 fL (ref 77.6–95.7)
MEAN PLATELET VOLUME: 6.9 fL (ref 6.8–10.7)
MONOCYTES ABSOLUTE COUNT: 2.7 10*9/L — ABNORMAL HIGH (ref 0.3–0.8)
MONOCYTES RELATIVE PERCENT: 18.4 %
NEUTROPHILS ABSOLUTE COUNT: 9.2 10*9/L — ABNORMAL HIGH (ref 1.8–7.8)
NEUTROPHILS RELATIVE PERCENT: 63.8 %
PLATELET COUNT: 171 10*9/L (ref 150–450)
RED BLOOD CELL COUNT: 3.04 10*12/L — ABNORMAL LOW (ref 3.95–5.13)
RED CELL DISTRIBUTION WIDTH: 21.3 % — ABNORMAL HIGH (ref 12.2–15.2)
WBC ADJUSTED: 14.5 10*9/L — ABNORMAL HIGH (ref 3.6–11.2)

## 2022-02-04 MED ADMIN — heparin, porcine (PF) 100 unit/mL injection 500 Units: 500 [IU] | INTRAVENOUS | @ 19:00:00 | Stop: 2022-02-05

## 2022-02-04 NOTE — Unmapped (Signed)
14:05 = pt ambulated with stable gait to treatment area for [facility] labs/count check for possible transfusion. Pt states feeling well other than continuing dry cough which she states is not new and denies any fever/chills. Pt states energy level good.  Pt Medial Port accessed and labs drawn and sent with T+S and flushed. Awaiting lab results.  15:10 = pt labs resulted stable - no transfusions required per parameters. Pt made aware and Port flushed and de-accessed per protocol. Pt given copy of AVS and ambulated with stable gait from treatment area

## 2022-02-04 NOTE — Unmapped (Signed)
RED ZONE Means: RED ZONE: Take action now!     You need to be seen right away  Symptoms are at a severe level of discomfort    Call 911 or go to your nearest  Hospital for help     - Bleeding that will not stop    - Hard to breathe    - New seizure - Chest pain  - Fall or passing out  -Thoughts of hurting    yourself or others      Call 911 if you are going into the RED ZONE                  YELLOW ZONE Means:     Please call with any new or worsening symptom(s), even if not on this list.  Call [phone]  After hours, weekends, and holidays - you will reach a long recording with specific instructions, If not in an emergency such as above, please listen closely all the way to the end and choose the option that relates to your need.   You can be seen by a provider the same day through our Same Day Acute Care for Patients with Cancer program.      YELLOW ZONE: Take action today     Symptoms are new or worsening  You are not within your goal range for:    - Pain    - Shortness of breath    - Bleeding (nose, urine, stool, wound)    - Feeling sick to your stomach and throwing up    - Mouth sores/pain in your mouth or throat    - Hard stool or very loose stools (increase in       ostomy output)    - No urine for 12 hours    - Feeding tube or other catheter/tube issue    - Redness or pain at previous IV or port/catheter site    - Depressed or anxiety   - Swelling (leg, arm, abdomen,     face, neck)  - Skin rash or skin changes  - Wound issues (redness, drainage,    re-opened)  - Confusion  - Vision changes  - Fever >100.4 F or chills  - Worsening cough with mucus that is    green, yellow, or bloody  - Pain or burning when going to the    bathroom  - Home Infusion Pump Issue- call    [phone]         Call your healthcare provider if you are going into the YELLOW ZONE     GREEN ZONE Means:  Your symptoms are under controls  Continue to take your medicine as ordered  Keep all visits to the provider GREEN ZONE: You are in control  No increase or worsening symptoms  Able to take your medicine  Able to drink and eat    - DO NOT use MyChart messages to report red or yellow symptoms. Allow up to 3    business days for a reply.  -MyChart is for non-urgent medication refills, scheduling requests, or other general questions.         [id number] Rev. 01/28/2022  Approved by Oncology Patient Education Committee          Hospital Outpatient Visit on 02/04/2022   Component Date Value Ref Range Status    WBC 02/04/2022 14.5 (H)  3.6 - 11.2 10*9/L Preliminary    RBC 02/04/2022 3.04 (L)  3.95 - 5.13 10*12/L Preliminary  HGB 02/04/2022 9.3 (L)  11.3 - 14.9 g/dL Preliminary    HCT 46/96/2952 27.8 (L)  34.0 - 44.0 % Preliminary    MCV 02/04/2022 91.5  77.6 - 95.7 fL Preliminary    MCH 02/04/2022 30.7  25.9 - 32.4 pg Preliminary    MCHC 02/04/2022 33.6  32.0 - 36.0 g/dL Preliminary    RDW 84/13/2440 21.3 (H)  12.2 - 15.2 % Preliminary    MPV 02/04/2022 6.9  6.8 - 10.7 fL Preliminary    Platelet 02/04/2022 171  150 - 450 10*9/L Preliminary    Anisocytosis 02/04/2022 Moderate (A)  Not Present Preliminary

## 2022-02-09 ENCOUNTER — Ambulatory Visit: Admit: 2022-02-09 | Discharge: 2022-02-10 | Payer: PRIVATE HEALTH INSURANCE

## 2022-02-09 ENCOUNTER — Encounter: Admit: 2022-02-09 | Discharge: 2022-02-10 | Payer: PRIVATE HEALTH INSURANCE

## 2022-02-09 LAB — URINALYSIS WITH MICROSCOPY
BACTERIA: NONE SEEN /HPF
BILIRUBIN UA: NEGATIVE
GLUCOSE UA: NEGATIVE
KETONES UA: NEGATIVE
LEUKOCYTE ESTERASE UA: NEGATIVE
NITRITE UA: NEGATIVE
PH UA: 6 (ref 5.0–9.0)
PROTEIN UA: NEGATIVE
RBC UA: <1 /HPF (ref <=4)
SPECIFIC GRAVITY UA: 1.019 (ref 1.003–1.030)
SQUAMOUS EPITHELIAL: <1 /HPF (ref 0–5)
UROBILINOGEN UA: <2
WBC UA: <1 /HPF (ref 0–5)

## 2022-02-09 LAB — CBC W/ AUTO DIFF
BASOPHILS ABSOLUTE COUNT: 0 10*9/L (ref 0.0–0.1)
BASOPHILS RELATIVE PERCENT: 0.4 %
EOSINOPHILS ABSOLUTE COUNT: 0 10*9/L (ref 0.0–0.5)
EOSINOPHILS RELATIVE PERCENT: 0 %
HEMATOCRIT: 22.9 % — ABNORMAL LOW (ref 34.0–44.0)
HEMOGLOBIN: 7.9 g/dL — ABNORMAL LOW (ref 11.3–14.9)
LYMPHOCYTES ABSOLUTE COUNT: 2.3 10*9/L (ref 1.1–3.6)
LYMPHOCYTES RELATIVE PERCENT: 22.1 %
MEAN CORPUSCULAR HEMOGLOBIN CONC: 34.2 g/dL (ref 32.0–36.0)
MEAN CORPUSCULAR HEMOGLOBIN: 32.2 pg (ref 25.9–32.4)
MEAN CORPUSCULAR VOLUME: 94.1 fL (ref 77.6–95.7)
MEAN PLATELET VOLUME: 7 fL (ref 6.8–10.7)
MONOCYTES ABSOLUTE COUNT: 1.4 10*9/L — ABNORMAL HIGH (ref 0.3–0.8)
MONOCYTES RELATIVE PERCENT: 13.6 %
NEUTROPHILS ABSOLUTE COUNT: 6.8 10*9/L (ref 1.8–7.8)
NEUTROPHILS RELATIVE PERCENT: 63.9 %
PLATELET COUNT: 90 10*9/L — ABNORMAL LOW (ref 150–450)
RED BLOOD CELL COUNT: 2.44 10*12/L — ABNORMAL LOW (ref 3.95–5.13)
RED CELL DISTRIBUTION WIDTH: 26.1 % — ABNORMAL HIGH (ref 12.2–15.2)
WBC ADJUSTED: 10.6 10*9/L (ref 3.6–11.2)

## 2022-02-09 LAB — SLIDE REVIEW

## 2022-02-09 LAB — COMPREHENSIVE METABOLIC PANEL
ALBUMIN: 3.5 g/dL (ref 3.4–5.0)
ALKALINE PHOSPHATASE: 92 U/L (ref 46–116)
ALT (SGPT): 105 U/L — ABNORMAL HIGH (ref 10–49)
ANION GAP: 6 mmol/L (ref 5–14)
AST (SGOT): 54 U/L — ABNORMAL HIGH (ref <=34)
BILIRUBIN TOTAL: 0.7 mg/dL (ref 0.3–1.2)
BLOOD UREA NITROGEN: 15 mg/dL (ref 9–23)
BUN / CREAT RATIO: 25
CALCIUM: 8.9 mg/dL (ref 8.7–10.4)
CHLORIDE: 108 mmol/L — ABNORMAL HIGH (ref 98–107)
CO2: 26 mmol/L (ref 20.0–31.0)
CREATININE: 0.6 mg/dL
EGFR CKD-EPI (2021) FEMALE: >90 mL/min/{1.73_m2} (ref >=60)
GLUCOSE RANDOM: 106 mg/dL (ref 70–179)
POTASSIUM: 3.8 mmol/L (ref 3.4–4.8)
PROTEIN TOTAL: 5.9 g/dL (ref 5.7–8.2)
SODIUM: 140 mmol/L (ref 135–145)

## 2022-02-09 LAB — HCG QUANTITATIVE, BLOOD: GONADOTROPIN, CHORIONIC (HCG) QUANT: <2.6 m[IU]/mL

## 2022-02-09 MED ORDER — AMOXICILLIN 875 MG-POTASSIUM CLAVULANATE 125 MG TABLET
ORAL_TABLET | Freq: Two times a day (BID) | ORAL | 0 refills | 10.00000 days | Status: CP
Start: 2022-02-09 — End: 2022-02-19

## 2022-02-09 MED ORDER — DAPSONE 100 MG TABLET
ORAL_TABLET | Freq: Every day | ORAL | 2 refills | 30 days | Status: CP
Start: 2022-02-09 — End: 2022-05-10

## 2022-02-09 MED ADMIN — heparin, porcine (PF) 100 unit/mL injection 500 Units: 500 [IU] | INTRAVENOUS | Stop: 2022-02-10

## 2022-02-09 MED ADMIN — sodium chloride (NS) 0.9 % infusion: 50 mL/h | INTRAVENOUS | @ 23:00:00

## 2022-02-09 NOTE — Unmapped (Signed)
Hospital Outpatient Visit on 02/09/2022   Component Date Value Ref Range Status    Sodium 02/09/2022 140  135 - 145 mmol/L Final    Potassium 02/09/2022 3.8  3.4 - 4.8 mmol/L Final    Chloride 02/09/2022 108 (H)  98 - 107 mmol/L Final    CO2 02/09/2022 26.0  20.0 - 31.0 mmol/L Final    Anion Gap 02/09/2022 6  5 - 14 mmol/L Final    BUN 02/09/2022 15  9 - 23 mg/dL Final    Creatinine 16/05/9603 0.60  0.60 - 0.80 mg/dL Final    BUN/Creatinine Ratio 02/09/2022 25   Final    eGFR CKD-EPI (2021) Female 02/09/2022 >90  >=60 mL/min/1.25m2 Final    eGFR calculated with CKD-EPI 2021 equation in accordance with [employer] and [employer] of Nephrology Task Force recommendations.    Glucose 02/09/2022 106  70 - 179 mg/dL Final    Calcium 54/04/8118 8.9  8.7 - 10.4 mg/dL Final    Albumin 14/78/2956 3.5  3.4 - 5.0 g/dL Final    Total Protein 02/09/2022 5.9  5.7 - 8.2 g/dL Final    Total Bilirubin 02/09/2022 0.7  0.3 - 1.2 mg/dL Final    AST 21/30/8657 54 (H)  <=34 U/L Final    ALT 02/09/2022 105 (H)  10 - 49 U/L Final    Alkaline Phosphatase 02/09/2022 92  46 - 116 U/L Final    Color, UA 02/09/2022 Yellow   Final    Clarity, UA 02/09/2022 Clear   Final    Specific Gravity, UA 02/09/2022 1.019  1.003 - 1.030 Final    pH, UA 02/09/2022 6.0  5.0 - 9.0 Final    Leukocyte Esterase, UA 02/09/2022 Negative  Negative Final    Nitrite, UA 02/09/2022 Negative  Negative Final    Protein, UA 02/09/2022 Negative  Negative Final    Glucose, UA 02/09/2022 Negative  Negative Final    Ketones, UA 02/09/2022 Negative  Negative Final    Urobilinogen, UA 02/09/2022 <2.0 mg/dL  <8.4 mg/dL Final    Bilirubin, UA 02/09/2022 Negative  Negative Final    Blood, UA 02/09/2022 Moderate (A)  Negative Final    RBC, UA 02/09/2022 <1  <=4 /HPF Final    WBC, UA 02/09/2022 <1  0 - 5 /HPF Final    Squam Epithel, UA 02/09/2022 <1  0 - 5 /HPF Final    Bacteria, UA 02/09/2022 None Seen  None Seen /HPF Final    Mucus, UA 02/09/2022 Rare (A)  None Seen /HPF Final    hCG Quantitative 02/09/2022 <2.6  mIU/mL Final    WBC 02/09/2022 10.6  3.6 - 11.2 10*9/L Final    RBC 02/09/2022 2.44 (L)  3.95 - 5.13 10*12/L Final    HGB 02/09/2022 7.9 (L)  11.3 - 14.9 g/dL Final    HCT 69/62/9528 22.9 (L)  34.0 - 44.0 % Final    MCV 02/09/2022 94.1  77.6 - 95.7 fL Final    MCH 02/09/2022 32.2  25.9 - 32.4 pg Final    MCHC 02/09/2022 34.2  32.0 - 36.0 g/dL Final    RDW 41/32/4401 26.1 (H)  12.2 - 15.2 % Final    MPV 02/09/2022 7.0  6.8 - 10.7 fL Final    Platelet 02/09/2022 90 (L)  150 - 450 10*9/L Final    Neutrophils % 02/09/2022 63.9  % Final    Lymphocytes % 02/09/2022 22.1  % Final    Monocytes % 02/09/2022 13.6  %  Final    Eosinophils % 02/09/2022 0.0  % Final    Basophils % 02/09/2022 0.4  % Final    Absolute Neutrophils 02/09/2022 6.8  1.8 - 7.8 10*9/L Final    Absolute Lymphocytes 02/09/2022 2.3  1.1 - 3.6 10*9/L Final    Absolute Monocytes 02/09/2022 1.4 (H)  0.3 - 0.8 10*9/L Final    Absolute Eosinophils 02/09/2022 0.0  0.0 - 0.5 10*9/L Final    Absolute Basophils 02/09/2022 0.0  0.0 - 0.1 10*9/L Final    Anisocytosis 02/09/2022 Marked (A)  Not Present Final    Smear Review Comments 02/09/2022 See Comment (A)  Undefined Final    Myelocytes present.  [id number]    Polychromasia 02/09/2022 Slight (A)  Not Present Final    Basophilic Stippling 02/09/2022 Present (A)  Not Present Final

## 2022-02-09 NOTE — Unmapped (Signed)
[name] Refill Coordination Note    Specialty Medication(s) to be Shipped:   Hematology/Oncology: Sprycel 70mg     Other medication(s) to be shipped: No additional medications requested for fill at this time     Patricia [name], DOB: 1975-10-16  Phone: [phone] (home)       All above HIPAA information was verified with patient.     Was a Nurse, learning disability used for this call? No    Completed refill call assessment today to schedule patient's medication shipment from the [facility] [phone]).  All relevant notes have been reviewed.     Specialty medication(s) and dose(s) confirmed: Patient reports changes to the regimen as follows: decreased to 70 mg daily    Changes to medications: Patricia [name] reports no changes at this time.  Changes to insurance: No  New side effects reported not previously addressed with a pharmacist or physician: None reported  Questions for the pharmacist: No    Confirmed patient received a Conservation officer, historic buildings and a Surveyor, mining with first shipment. The patient will receive a drug information handout for each medication shipped and additional FDA Medication Guides as required.       DISEASE/MEDICATION-SPECIFIC INFORMATION        N/A    SPECIALTY MEDICATION ADHERENCE     Medication Adherence    Patient reported X missed doses in the last month: 0  Specialty Medication: Sprycel 70mg   Patient is on additional specialty medications: No  Informant: patient  Confirmed plan for next specialty medication refill: delivery by pharmacy  Refills needed for supportive medications: not needed          Were doses missed due to medication being on hold? No    Sprycel 70 mg: 7 days of medicine on hand       REFERRAL TO PHARMACIST     Referral to the pharmacist: Not needed      Patricia [name]     Shipping address confirmed in Epic.     Delivery Scheduled: Yes, Expected medication delivery date: 02/12/22.     Medication will be delivered via Next Day Courier to the prescription address in Epic WAM.    Patricia [name]   [facility] [facility] Specialty Pharmacist

## 2022-02-09 NOTE — Unmapped (Signed)
Infusion Center Progress Note    [name]  Patient Age: 46 y.o.  Encounter Date: 02/09/2022    Reason for visit  Chief Complaint: Sinus headache     I spent at least 35 minutes with this patient: assessing, performing physical exam with > 50% of time spent in counseling.      Assessment/Plan:  Recurrent Bacterial Sinusitis  Augmentin 875-125 mg bid for 10 days (prescription sent to pt's local pharmacy)  Continue sinus cleanse as previously recommended    I have discussed the case (exam, laboratory results, and plan) with [name], NP. Patient and team are all in agreement with plan outlined below.    Subjective/HPI:  Patricia [name] is a 46 y.o. year old patient with ALL who was to be admitted for new cycle of treatment today, however admission/ treatment has been deferred d/t low counts. Patricia [name] also reports she has had ongoing sinus headache for more than 2 weeks and which is worse when she leans forward, post nasal drainage and cough which is worse in the morning. Reports thick yellow/greenish nasal drainage, along with nasal congestion. She reports good appetite, good energy levels, regular BMs and adequate void. She denies fever, chest pain, or SOB.     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 The majority of the abnormal cells (64/88) contained an additional BCR/ABL1 fusion signal, while 9/88 abnormal cells contained an additional ABL1 and ASS1 signal. Genetics:   Karyotype/FISH:   RESULTS   Date Value Ref Range Status   10/28/2021   Final    NOTE: This report reflects a combined study from a peripheral blood and a bone marrow core biopsy. Eleven cells from the peripheral blood and nine cells from the bone marrow core biopsy were analyzed. The BCR/ABL1 FISH analysis was performed on the peripheral blood.     Abnormal Karyotype:  47,XX,t(9;22)(q34;q11.2),+der(22)t(9;22)[18]/46,XX[2]    Abnormal FISH:  A BCR/ABL1 interphase FISH assay showed a signal pattern consistent with a BCR::ABL1 rearrangement and the 9;22 translocation in 88% of the 100 cells scored. The majority of the abnormal cells (64/88) contained an additional BCR/ABL1 fusion signal, while 9/88 abnormal cells contained an additional ABL1 and ASS1 signal.           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  MRD by flow insufficient, p190 2/100,000  12/10/2021: Cycle 2 B cycle + rituximab  12/14/21: IT#4  12/17/21: IT#5  01/10/22: Cycle 3 A cycle + rituximab  01/24/22: Bmbx - MRD-neg by PCR (p190)   ~02/09/22: PLAN C4          B-cell acute lymphoblastic leukemia (ALL) (CMS-HCC)   10/29/2021 Initial Diagnosis    B-cell acute  lymphoblastic leukemia (ALL) (CMS-HCC)     10/30/2021 -  Chemotherapy    IP/OP LEUKEMIA GRAAPH-2005 < 60 YO (OP PEGFILGRASTIM ON DAY 7)  [No description for this plan]     01/06/2022 Endocrine/Hormone Therapy    OP LEUPROLIDE (LUPRON) 11.25 MG EVERY 3 MONTHS  Plan Provider: [name], MD         Allergies:  Allergies   Allergen Reactions   ??? Erythromycin Hives   ??? Other      Pt cant take any pain reliever or fever reducer due to condition.   ??? Sulfa (Sulfonamide Antibiotics) Anaphylaxis   ??? Azithromycin Medications:  Prior to Admission medications    Medication Dose, Route, Frequency   acetaminophen (TYLENOL 8 HOUR) 650 MG CR tablet 650 mg, Oral, Every 8 hours PRN   cetirizine (ZYRTEC) 10 MG tablet 10 mg, Oral, Nightly   dapsone 100 MG tablet 100 mg, Oral, Daily (standard)   dasatinib (SPRYCEL) 70 MG tablet 70 mg, Oral, Daily (standard)   escitalopram oxalate (LEXAPRO) 10 MG tablet 10 mg, Oral, Daily (standard)   hydrocortisone 1 % cream Apply to affected area 2 times daily   levalbuterol (XOPENEX) 0.31 mg/3 mL nebulizer solution 1 ampule, Nebulization, Every 4 hours PRN   nifedipine 0.3% lidocaine 1.5% in petrolatum ointment 1 application., Topical, Daily (standard)   prochlorperazine (COMPAZINE) 10 MG tablet 10 mg, Oral, Every 6 hours PRN   valACYclovir (VALTREX) 500 MG tablet 500 mg, Oral, Daily (standard)       Review of Systems:  A complete review of systems was obtained including: General/Constitutional, Integumentary, HEENT, Respiratory, Cardiovascular, Gastrointestinal, Genitourinary, Musculoskeletal, Neurological systems. It is negative or non-contributory to the patient???s management except for positives mentioned in HPI.     Physical Exam:  Vital Signs:  Wt Readings from Last 1 Encounters:   02/09/22 82.5 kg (181 lb 14.1 oz)     Temp Readings from Last 1 Encounters:   02/09/22 37.6 ??C (99.6 ??F) (Oral)     BP Readings from Last 1 Encounters:   02/09/22 114/80     Pulse Readings from Last 1 Encounters:   02/09/22 108     General:               resting comfortably, in no acute distress, chronically ill-appearing  HEENT:               normocephalic, atraumatic. Significant tenderness reported with palpation over maxillary sinuses, mild tenderness in frontal sinus noted.  Neck:                   normal ROM, supple  Cardiovascular:   S1S2 regular rate and rhythm. no murmurs, rubs, or gallops.  Respiratory:         clear to auscultation bilaterally no wheezes, rhonchi, or rales no accessory muscle use  Gastrointestinal:  soft and non-distended  Musculoskeletal:  full ROM of upper and lower extremities and no bony pain or tenderness  Extremities:          extremities are warm and without edema, distal pulses are full and symmetric  Skin:                    warm, dry and no rashes, lesions or breakdown  Psychiatric:          appropriate, calm and cooperative    Results:  Hospital Outpatient Visit on 02/09/2022   Component Date Value Ref Range Status   ??? Sodium 02/09/2022  140  135 - 145 mmol/L Final   ??? Potassium 02/09/2022 3.8  3.4 - 4.8 mmol/L Final   ??? Chloride 02/09/2022 108 (H)  98 - 107 mmol/L Final   ??? CO2 02/09/2022 26.0  20.0 - 31.0 mmol/L Final   ??? Anion Gap 02/09/2022 6  5 - 14 mmol/L Final   ??? BUN 02/09/2022 15  9 - 23 mg/dL Final   ??? Creatinine 02/09/2022 0.60  0.60 - 0.80 mg/dL Final   ??? BUN/Creatinine Ratio 02/09/2022 25   Final   ??? eGFR CKD-EPI (2021) Female 02/09/2022 >90  >=60 mL/min/1.78m2 Final    eGFR calculated with CKD-EPI 2021 equation in accordance with [employer] and [employer] of Nephrology Task Force recommendations.   ??? Glucose 02/09/2022 106  70 - 179 mg/dL Final   ??? Calcium 16/05/9603 8.9  8.7 - 10.4 mg/dL Final   ??? Albumin 54/04/8118 3.5  3.4 - 5.0 g/dL Final   ??? Total Protein 02/09/2022 5.9  5.7 - 8.2 g/dL Final   ??? Total Bilirubin 02/09/2022 0.7  0.3 - 1.2 mg/dL Final   ??? AST 14/78/2956 54 (H)  <=34 U/L Final   ??? ALT 02/09/2022 105 (H)  10 - 49 U/L Final   ??? Alkaline Phosphatase 02/09/2022 92  46 - 116 U/L Final   ??? Color, UA 02/09/2022 Yellow   Final   ??? Clarity, UA 02/09/2022 Clear   Final   ??? Specific Gravity, UA 02/09/2022 1.019  1.003 - 1.030 Final   ??? pH, UA 02/09/2022 6.0  5.0 - 9.0 Final   ??? Leukocyte Esterase, UA 02/09/2022 Negative  Negative Final   ??? Nitrite, UA 02/09/2022 Negative  Negative Final   ??? Protein, UA 02/09/2022 Negative  Negative Final   ??? Glucose, UA 02/09/2022 Negative  Negative Final   ??? Ketones, UA 02/09/2022 Negative  Negative Final   ??? Urobilinogen, UA 02/09/2022 <2.0 mg/dL  <2.1 mg/dL Final   ??? Bilirubin, UA 02/09/2022 Negative  Negative Final   ??? Blood, UA 02/09/2022 Moderate (A)  Negative Final   ??? RBC, UA 02/09/2022 <1  <=4 /HPF Final   ??? WBC, UA 02/09/2022 <1  0 - 5 /HPF Final   ??? Squam Epithel, UA 02/09/2022 <1  0 - 5 /HPF Final   ??? Bacteria, UA 02/09/2022 None Seen  None Seen /HPF Final   ??? Mucus, UA 02/09/2022 Rare (A)  None Seen /HPF Final   ??? hCG Quantitative 02/09/2022 <2.6  mIU/mL Final   ??? WBC 02/09/2022 10.6  3.6 - 11.2 10*9/L Final   ??? RBC 02/09/2022 2.44 (L)  3.95 - 5.13 10*12/L Final   ??? HGB 02/09/2022 7.9 (L)  11.3 - 14.9 g/dL Final   ??? HCT 30/86/5784 22.9 (L)  34.0 - 44.0 % Final   ??? MCV 02/09/2022 94.1  77.6 - 95.7 fL Final   ??? MCH 02/09/2022 32.2  25.9 - 32.4 pg Final   ??? MCHC 02/09/2022 34.2  32.0 - 36.0 g/dL Final   ??? RDW 69/62/9528 26.1 (H)  12.2 - 15.2 % Final   ??? MPV 02/09/2022 7.0  6.8 - 10.7 fL Final   ??? Platelet 02/09/2022 90 (L)  150 - 450 10*9/L Final   ??? Neutrophils % 02/09/2022 63.9  % Final   ??? Lymphocytes % 02/09/2022 22.1  % Final   ??? Monocytes % 02/09/2022 13.6  % Final   ??? Eosinophils % 02/09/2022 0.0  % Final   ??? Basophils % 02/09/2022  0.4  % Final   ??? Absolute Neutrophils 02/09/2022 6.8  1.8 - 7.8 10*9/L Final   ??? Absolute Lymphocytes 02/09/2022 2.3  1.1 - 3.6 10*9/L Final   ??? Absolute Monocytes 02/09/2022 1.4 (H)  0.3 - 0.8 10*9/L Final   ??? Absolute Eosinophils 02/09/2022 0.0  0.0 - 0.5 10*9/L Final   ??? Absolute Basophils 02/09/2022 0.0  0.0 - 0.1 10*9/L Final   ??? Anisocytosis 02/09/2022 Marked (A)  Not Present Final   ??? Smear Review Comments 02/09/2022 See Comment (A)  Undefined Final    Myelocytes present.  [id number]   ??? Polychromasia 02/09/2022 Slight (A)  Not Present Final   ??? Basophilic Stippling 02/09/2022 Present (A)  Not Present Final     Leeroy Cha, FNP

## 2022-02-09 NOTE — Unmapped (Signed)
Hematology/Oncology     Attending Physician :  No att. providers found  Accepting Service  : No service for patient encounter.  Reason for Admission: ***    Problem List:   Patient Active Problem List   Diagnosis   ??? Leukocytosis   ??? B-cell acute lymphoblastic leukemia (ALL) (CMS-HCC)   ??? Acute cough   ??? Dyspepsia   ??? Acute frontal sinusitis        Assessment/Plan: JOHNESHA ACHEAMPONG is an 46 y.o. 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    Ph+ALL,??in remission:??Initially diagnosed in 09/2021 and??underwent [id number] induction??complicated by??PICC line associated??superficial thrombus. BMBx on 5/2 revealed morphologic CR. Admitted for Cycle??4B??HyperCVAD + Dasatinib. Patient has completed #6??IT chemos prior to admission, with plan to do one per cycle due to intolerance of two/cycle.    cRegimen: R-HyperCVAD  Cycle # 4B  Primary Oncologist: Senaida Ores    Even Cycles  Date        Day 1 2 3 4 5    Methotrexate IV 1000mg /m2 x       Cytarabine 3g/m2 Q12H  X  x X  x     Methotrexate IT 12mg   x        EVEN CYCLE DETAILS  - OK to treat placed on ***  - If age <72 yo will need 24 hours MTX level otherwise will follow 40 hour MTX level***  - bicarb continuous infusion until MTX level <0.05  - Leucovorin titration until MTX level <0.05   - daily UA to maintain pH >/= 7.0     Supportive  -- antiemetics per protocol    Inpatient Prophylactic: Valtrex 500 mg PO daily      Disposition EVEN:  - Neutlasta ***  - Three times weekly labs ***  - Dr. Marland Kitchen follow up ***     Grade 1 peripheral neuropathy: Paraesthesias??present??primarily in her??finger pads, currently without medical interventions continue to monitor while on therapy.??Not affecting ADLs.  [ ]  Continue to monitor with [facility].    Anxiety:??Reports taking??Lexapro 10 mg at??home  - Continue Lexapro 10mg  daily on admission    Acute on Chronic back and knee pain, upper and lower back. Improved. Has not had to take oxycodone outside of needing to take after moving boxes on Saturday. Patient with intermittent b/l knee pain, relieved with APAP and oxycodone 5mg .   - Oxycodone 5mg  prn continued on admission  ??  Headaches:  - PRN Esgisic  ??  Resent??History acute??on chronic??sinusitis: Completed??augmentin 875-125mg  PO BID x 7(5/1-5/8). Symptoms improving??but ongoing for approx a month. She has been doing xopenex BID at home for cough. Takes zyrtec daily. Continue home regimen.  - Symptoms have resolved, monitor for recurrence***  ????  Psychosocial distress:??She??reports a moderate??level of anxiety regarding the management of the above.   - Counseling given   - Consider ref to comprehensive cancer support program??    Cancer related fatigue:  Patient endorses fatigue with onset of cancer symptoms or treatment.  Symptoms started ***weeks ago.  Primary symptoms are ***      Cancer related Pain:  Patient endorses pain associated with .  Pain worsens with *** .  Pain has/has*** not improved with treatment  -Current Regimen***    Immunocompromised status: Patient is immunocompromised secondary to*** disease or chemotherapy  -Antimicrobial prophylaxis as above     Impending Electrolyte Abnormality Secondary to Chemotherapy and/or IV Fluids  -Daily Electrolyte monitoring  -Replete per [facility] guidelines.     ***  -  Transfuse 1 unit of pRBCs for hgb >7  - Transfuse 1 unit plt for plts >10K     HPI: KENDREA CERRITOS is an 46 y.o. female who presented with ***    Review of Systems: All positive and pertinent negatives are noted in the HPI; otherwise all other systems are negative    Oncologic History:   Primary Oncologist: ***  Oncology History Overview Note   Referring/Local Oncologist:    Diagnosis:   10/29/2021  Bone marrow, left iliac, aspiration and biopsy  -  Hypercellular bone marrow (greater than 90%) involved by B lymphoblastic leukemia (~95%blasts by morphologic assessment of aspirate smears and touch preps)  - Abnormal Karyotype: 47,XX,t(9;22)(q34;q11.2),+der(22)t(9;22)[18]/46,XX[2]     Abnormal FISH:  A BCR/ABL1 interphase FISH assay showed a signal pattern consistent with a BCR::ABL1 rearrangement and the 9;22 translocation in 88% of the 100 cells scored. The majority of the abnormal cells (64/88) contained an additional BCR/ABL1 fusion signal, while 9/88 abnormal cells contained an additional ABL1 and ASS1 signal.       Genetics:   Karyotype/FISH:   RESULTS   Date Value Ref Range Status   10/28/2021   Final    NOTE: This report reflects a combined study from a peripheral blood and a bone marrow core biopsy. Eleven cells from the peripheral blood and nine cells from the bone marrow core biopsy were analyzed. The BCR/ABL1 FISH analysis was performed on the peripheral blood.     Abnormal Karyotype:  47,XX,t(9;22)(q34;q11.2),+der(22)t(9;22)[18]/46,XX[2]    Abnormal FISH:  A BCR/ABL1 interphase FISH assay showed a signal pattern consistent with a BCR::ABL1 rearrangement and the 9;22 translocation in 88% of the 100 cells scored. The majority of the abnormal cells (64/88) contained an additional BCR/ABL1 fusion signal, while 9/88 abnormal cells contained an additional ABL1 and ASS1 signal.           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  MRD by flow insufficient, p190 2/100,000  12/10/2021: Cycle 2 B cycle + rituximab  12/14/21: IT#4  12/17/21: IT#5  01/10/22: Cycle 3 A cycle + rituximab  01/24/22: Bmbx - MRD-neg by PCR (p190)   ~02/09/22: PLAN C4          B-cell acute lymphoblastic leukemia (ALL) (CMS-HCC)   10/29/2021 Initial Diagnosis    B-cell acute lymphoblastic leukemia (ALL) (CMS-HCC)     10/30/2021 -  Chemotherapy    IP/OP LEUKEMIA GRAAPH-2005 < 60 YO (OP PEGFILGRASTIM ON DAY 7)  [No description for this plan]     01/06/2022 Endocrine/Hormone Therapy    OP LEUPROLIDE (LUPRON) 11.25 MG EVERY 3 MONTHS  Plan Provider: [name], MD         Medical History:  PCP: [name], MD  Past Medical History:   Diagnosis Date   ??? 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 IR INSERT PORT AGE GREATER THAN 5 YRS  12/08/2021    IR INSERT PORT AGE GREATER THAN 5 YRS 12/08/2021 [name], PA IMG VIR [facility]      Social History:  Social History  Socioeconomic History   ??? Marital status: Married   Tobacco Use   ??? Smoking status: Never   ??? Smokeless tobacco: Never     Social Determinants of Health     Financial Resource Strain: Low Risk    ??? Difficulty of Paying Living Expenses: Not hard at all   Food Insecurity: No Food Insecurity   ??? Worried About Programme researcher, broadcasting/film/video in the Last Year: Never true   ??? Ran Out of Food in the Last Year: Never true   Transportation Needs: No Transportation Needs   ??? Lack of Transportation (Medical): No   ??? Lack of Transportation (Non-Medical): No      Family History:   family history is not on file.***       Allergies: is allergic to erythromycin, other, sulfa (sulfonamide antibiotics), and azithromycin.    Medications:   Meds:  Continuous Infusions:  PRN Meds:.    Objective:   Vitals: @VSRANGES @    Physical Exam:  General: Resting, in no apparent distress, lying in bed  HEENT:  PERRL. No scleral icterus or conjunctival injection. MMM without ulceration, erythema or exudate. No cervical or axillary lymphadenopathy.   Heart:  RRR. S1, S2. No murmurs, gallops, or rubs.  Lungs:  Breathing is unlabored, and patient is speaking full sentences with ease.  No stridor.  CTAB. No rales, ronchi, or crackles.    Abdomen:  No distention or pain on palpation.  Bowel sounds are present and normoactive x 4.  No palpable hepatomegaly or splenomegaly.  No palpable masses.  Skin:  No rashes, petechiae or purpura.  No areas of skin breakdown. Warm to touch, dry, smooth, and even.  Musculoskeletal:  No grossly-evident joint effusions or deformities.  Range of motion about the shoulder, elbow, hips and knees is grossly normal.  Psychiatric:  Range of affect is appropriate.    Neurologic:  Alert and oriented to person, place, time and situation.  Gait is normal.  No Nystagmus Cerebellar tasks (finger-to-nose, rapid hand movement, heel along shin) are completed with ease and are symmetric. CNII-CNXII grossly intact.  Extremities:  Appear well-perfused. No clubbing, edema, or cyanosis.  CVAD: R CW Port - no erythema, nontender; dressing CDI.      Test Results  No results for input(s): WBC, NEUTROABS, HGB, PLT in the last 72 hours.  No results for input(s): NA, K, CL, CO2, BUN, CREATININE, MG, PHOS, CALCIUM in the last 72 hours.    Imaging:     DVT PPX Indicated: ,   FEN:  Discharge Plan:  - fluids:   - electrolytes: stable ***  - diet: regular ***    Need for PT:   Anticipated Discharge:     Code Status: @PATFPLSTATCODE @   @RRCODESTATUS @  ***    Time spent on counseling/coordination of care:   Total time spent with patient:

## 2022-02-10 DIAGNOSIS — C9100 Acute lymphoblastic leukemia not having achieved remission: Principal | ICD-10-CM

## 2022-02-10 NOTE — Unmapped (Signed)
Patient presented for a scheduled admit. Blood count was too low to be able to start treatments, 1 unit of pRBC's given as ordered. Patient tolerated treatment well and was stable at time of discharge. Self-ambulated to lobby with spouse.

## 2022-02-10 NOTE — Unmapped (Signed)
error 

## 2022-02-10 NOTE — Unmapped (Signed)
Hello,    Please scheduled patient ANACLARA ACKLIN on 02/16/2022 for a SCHED ADM THROUGH INF     Reason for Admission: Scheduled chemotherapy HyperCVAD  Primary Diagnosis:  Encounter for antineoplastic chemotherapy for ALL  Primary Diagnosis ICD-10 Code:  Z51.11 & C91.00  Expected length of stay: 3-5 Days   CPT Code:  45409   CPT Code Description: Under Injection and Intravenous Infusion Chemotherapy and Other Highly Complex Drug or Highly Complex Biologic Agent Administration   Treating Attending: Dittus  Need for PICC placement in Infusion? No    Infusion Scheduling: Please notify the patient of the appointment date and time once scheduled. Please document this information within this encounter and then route to the navigator prior to closing the encounter.       Scheduled Admissions Assessment        Assessment  Response  Intervention    Type of insurance  Private/ACA/Employer  Financial counselor referral is not warranted this time.     Reliable Trasportation  Yes Referral for Social Work is not warranted this time.   Central Line Access  Yes Patient has line access and does not require an intervention at this time          Thank you,  Legrand Pitts

## 2022-02-16 ENCOUNTER — Ambulatory Visit: Admit: 2022-02-16 | Discharge: 2022-02-21 | Payer: PRIVATE HEALTH INSURANCE

## 2022-02-16 ENCOUNTER — Encounter
Admit: 2022-02-16 | Discharge: 2022-02-21 | Disposition: A | Discharge status: Home / Self Care | Payer: PRIVATE HEALTH INSURANCE

## 2022-02-16 ENCOUNTER — Encounter: Admit: 2022-02-16 | Discharge: 2022-02-21 | Payer: PRIVATE HEALTH INSURANCE

## 2022-02-16 ENCOUNTER — Ambulatory Visit
Admit: 2022-02-16 | Discharge: 2022-02-21 | Disposition: A | Discharge status: Home / Self Care | Payer: PRIVATE HEALTH INSURANCE

## 2022-02-16 LAB — CBC W/ AUTO DIFF
BASOPHILS ABSOLUTE COUNT: 0.1 10*9/L (ref 0.0–0.1)
BASOPHILS RELATIVE PERCENT: 0.9 %
EOSINOPHILS ABSOLUTE COUNT: 0.1 10*9/L (ref 0.0–0.5)
EOSINOPHILS RELATIVE PERCENT: 0.5 %
HEMATOCRIT: 26.6 % — ABNORMAL LOW (ref 34.0–44.0)
HEMOGLOBIN: 9.2 g/dL — ABNORMAL LOW (ref 11.3–14.9)
LYMPHOCYTES ABSOLUTE COUNT: 4.9 10*9/L — ABNORMAL HIGH (ref 1.1–3.6)
LYMPHOCYTES RELATIVE PERCENT: 42.2 %
MEAN CORPUSCULAR HEMOGLOBIN CONC: 34.4 g/dL (ref 32.0–36.0)
MEAN CORPUSCULAR HEMOGLOBIN: 32.5 pg — ABNORMAL HIGH (ref 25.9–32.4)
MEAN CORPUSCULAR VOLUME: 94.4 fL (ref 77.6–95.7)
MEAN PLATELET VOLUME: 6.7 fL — ABNORMAL LOW (ref 6.8–10.7)
MONOCYTES ABSOLUTE COUNT: 1.3 10*9/L — ABNORMAL HIGH (ref 0.3–0.8)
MONOCYTES RELATIVE PERCENT: 11.1 %
NEUTROPHILS ABSOLUTE COUNT: 5.2 10*9/L (ref 1.8–7.8)
NEUTROPHILS RELATIVE PERCENT: 45.3 %
PLATELET COUNT: 121 10*9/L — ABNORMAL LOW (ref 150–450)
RED BLOOD CELL COUNT: 2.82 10*12/L — ABNORMAL LOW (ref 3.95–5.13)
RED CELL DISTRIBUTION WIDTH: 25.8 % — ABNORMAL HIGH (ref 12.2–15.2)
WBC ADJUSTED: 11.6 10*9/L — ABNORMAL HIGH (ref 3.6–11.2)

## 2022-02-16 LAB — COMPREHENSIVE METABOLIC PANEL
ALBUMIN: 3.6 g/dL (ref 3.4–5.0)
ALKALINE PHOSPHATASE: 94 U/L (ref 46–116)
ALT (SGPT): 137 U/L — ABNORMAL HIGH (ref 10–49)
ANION GAP: 5 mmol/L (ref 5–14)
AST (SGOT): 67 U/L — ABNORMAL HIGH (ref <=34)
BILIRUBIN TOTAL: 0.7 mg/dL (ref 0.3–1.2)
BLOOD UREA NITROGEN: 12 mg/dL (ref 9–23)
BUN / CREAT RATIO: 22
CALCIUM: 8.8 mg/dL (ref 8.7–10.4)
CHLORIDE: 108 mmol/L — ABNORMAL HIGH (ref 98–107)
CO2: 29 mmol/L (ref 20.0–31.0)
CREATININE: 0.54 mg/dL — ABNORMAL LOW
EGFR CKD-EPI (2021) FEMALE: >90 mL/min/{1.73_m2} (ref >=60)
GLUCOSE RANDOM: 120 mg/dL (ref 70–179)
POTASSIUM: 3.8 mmol/L (ref 3.4–4.8)
PROTEIN TOTAL: 5.9 g/dL (ref 5.7–8.2)
SODIUM: 142 mmol/L (ref 135–145)

## 2022-02-16 LAB — SLIDE REVIEW

## 2022-02-16 NOTE — Unmapped (Cosign Needed)
Hematology/Oncology     Attending Physician :  Providence Crosby Dittu*  Accepting Service  : Oncology/Hematology (MDE)  Reason for Admission: Scheduled Chemotherapy     Problem List:   Patient Active Problem List   Diagnosis    Leukocytosis    B-cell acute lymphoblastic leukemia (ALL) (CMS-HCC)    Acute cough    Dyspepsia    Acute frontal sinusitis        Assessment/Plan: 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      Ph+ALL, in remission: Initially diagnosed in 09/2021 and underwent [id number] induction complicated by PICC line associated superficial thrombus. BMBx on 5/2 revealed morphologic CR. Admitted for Cycle 4B HyperCVAD + Dasatinib. Patient has completed #6 IT chemos prior to admission, with plan to do one per cycle due to intolerance of two/cycle.  - D1=7/20 Methotrexate, delayed due DDI with penicillin - NEEDS OKTT   - Dasatinib 70 mg daily   - ppx Dapsone and Valtrex   - IT Cytarabine on 7/21 in [place]     cRegimen: R-HyperCVAD  Cycle # 4B  Primary Oncologist: Senaida Ores     Even Cycles  Date  7/20           Day 1 2 3 4 5    Methotrexate IV 1000mg /m2 x           Cytarabine 3g/m2 Q12H   X  x X  x       Methotrexate IT 12mg    x            EVEN CYCLE DETAILS  - OK to treat placed to be placed on 7/20  - bicarb continuous infusion until MTX level <0.05  - Leucovorin titration until MTX level <0.05   - daily UA to maintain pH >/= 7.0      Disposition EVEN:   - NO G-CSF on discharge due to leukocytosis and malaise   - OP Rituxan - scheduled for 7/24 - needs to be adjusted   - Three times weekly labs - needs to be requested   - Dr. Senaida Ores follow up on 8/10 - scheduled      Acute on chronic sinusitis: Started Augmentin 875-125mg  PO BID x 10 days. Day 1 02/09/22 . Symptoms improving since starting augmentin. Due to interaction between methotrexate and penicillins, we will stop Augmentin. She has completed 7 full days and 1 dose of the 8th day, which is sufficient treatment for sinusitis. Patient voiced concern of pneumonia developing due to a history of pneumonia after a 7 day course. After lengthy discussion of risk and benefits of delaying chemotherapy further, patient agreeable to stop Augmentin and begin chemotherapy. We will continue to monitor.   - Stop Augmentin on admission      Hemorrhoids: Symptoms significantly improved with OTC Dr. Silvana Newness ointment. Given improvement, patient would like to defer GI consult at this time.   - Continue OTC ointment     Grade 1 peripheral neuropathy: Paraesthesias present primarily in her finger pads, currently without medical interventions continue to monitor while on therapy. Not affecting ADLs.     Anxiety: Reports taking Lexapro 10 mg at home  - Continue Lexapro 10mg  daily on admission    Immunocompromised status: Patient is immunocompromised secondary to disease and chemotherapy  -Antimicrobial prophylaxis as above     Impending Electrolyte Abnormality Secondary to Chemotherapy and/or IV Fluids  -Daily Electrolyte monitoring  -Replete per  [facility] guidelines.     Impending Pancytopenia secondary to Acute Leukemia/chemotherapy:    - Transfuse 1 unit of pRBCs for hgb >7  - Transfuse 1 unit plt for plts >10K     HPI: Patricia [name] is an 46 y.o. female who presented for admission for chemotherapy for treatment of B-cell ALL. Patient reports currently on a 10 day course of Augmentin for acute sinusitis. She states symptoms have significantly improved since starting ABX. She voiced concern surrounding needing to discontinued the remaining tx course due to potential DDI with penicillin and methotrexate. She is specifically concerned about developing pneumonia as she has a history of pneumonia after a 7 day course of antibiotics for prior sinusitis. After discussion of risk and benefits of delaying chemotherapy further, patient agreeable to stop Augmentin and begin chemotherapy. Patient endorses occasional coughing that she attributes to post nasal drainage. She goes on to report significant improvement in hemorrhoids after switching to a new OTC Hemorrhoid cream. She is currently using Dr. Silvana Newness Ointment and states this provided good relief of her discomfort. She also expresses some concern for possible kidney stone. She reports thinking she saw blood in her urine a few days ago that has now resolved and continues to have some mild intermitted discomfort in the left lower abdomen similar to previous kidney stones. Denies any burning with urination.     Patient denies any dizziness, syncope, vision changes, nasuea, vomiting, body aches, chest pain, shortness of breath, constipation, blood in stool, abdominal pain, leg swelling, or joint pain.       Review of Systems: All positive and pertinent negatives are noted in the HPI; otherwise all other systems are negative    Oncologic History:   Primary Oncologist: Dr. Senaida Ores   Oncology History Overview Note   Referring/Local Oncologist:    Diagnosis:   10/29/2021  Bone marrow, left iliac, aspiration and biopsy  -  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 The majority of the abnormal cells (64/88) contained an additional BCR/ABL1 fusion signal, while 9/88 abnormal cells contained an additional ABL1 and ASS1 signal.       Genetics:   Karyotype/FISH:   RESULTS   Date Value Ref Range Status   10/28/2021   Final    NOTE: This report reflects a combined study from a peripheral blood and a bone marrow core biopsy. Eleven cells from the peripheral blood and nine cells from the bone marrow core biopsy were analyzed. The BCR/ABL1 FISH analysis was performed on the peripheral blood.     Abnormal Karyotype:  47,XX,t(9;22)(q34;q11.2),+der(22)t(9;22)[18]/46,XX[2]    Abnormal FISH:  A BCR/ABL1 interphase FISH assay showed a signal pattern consistent with a BCR::ABL1 rearrangement and the 9;22 translocation in 88% of the 100 cells scored. The majority of the abnormal cells (64/88) contained an additional BCR/ABL1 fusion signal, while 9/88 abnormal cells contained an additional ABL1 and ASS1 signal.           Pertinent Phenotypic data:  CD19 98%  CD20 on diagnosis 51%  CD22 98%      Treatment Timeline:  10/29/2021: Bone marrow biopsy: Ph+ ALL, 51% expression of CD20  10/30/21: Cycle 1 day 1 GRAAPH-2005 induction  11/03/21: ITT #  1   11/12/2021: IT #2  11/18/2021: IT #3  11/29/2021: Post cycle 1 bone marrow biopsy: Morphologic CR.  MRD by flow insufficient, p190 2/100,000  12/10/2021: Cycle 2 B cycle + rituximab  12/14/21: IT#4  12/17/21: IT#5  01/10/22: Cycle 3 A cycle + rituximab  01/24/22: Bmbx - MRD-neg by PCR (p190)   ~02/09/22: PLAN C4          B-cell acute lymphoblastic leukemia (ALL) (CMS-HCC)   10/29/2021 Initial Diagnosis    B-cell acute lymphoblastic leukemia (ALL) (CMS-HCC)     10/30/2021 -  Chemotherapy    IP/OP LEUKEMIA GRAAPH-2005 < 60 YO (OP PEGFILGRASTIM ON DAY 7)  [No description for this plan]     01/06/2022 Endocrine/Hormone Therapy    OP LEUPROLIDE (LUPRON) 11.25 MG EVERY 3 MONTHS  Plan Provider: [name], MD         Medical History:  PCP: [name], MD  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    Surgical History:  Past Surgical History:   Procedure Laterality Date    IR INSERT PORT AGE GREATER THAN 5 YRS  12/08/2021    IR INSERT PORT AGE GREATER THAN 5 YRS 12/08/2021 [name], PA IMG VIR [facility]      Social History:  Social History     Socioeconomic History    Marital status: Married   Tobacco Use    Smoking status: Never Smokeless tobacco: Never     Social Determinants of Health     Financial Resource Strain: Low Risk     Difficulty of Paying Living Expenses: Not hard at all   Food Insecurity: No Food Insecurity    Worried About Programme researcher, broadcasting/film/video in the Last Year: Never true    Barista in the Last Year: Never true   Transportation Needs: No Transportation Needs    Lack of Transportation (Medical): No    Lack of Transportation (Non-Medical): No      Family History:   family history is not on file.      Allergies: is allergic to erythromycin, other, sulfa (sulfonamide antibiotics), and azithromycin.    Medications:   Meds:  Current Outpatient Medications   Medication Instructions    acetaminophen (TYLENOL 8 HOUR) 650 mg, Oral, Every 8 hours PRN    amoxicillin-clavulanate (AUGMENTIN) 875-125 mg per tablet 1 tablet, Oral, 2 times a day (standard)    cetirizine (ZYRTEC) 10 mg, Oral, Nightly    dapsone 100 mg, Oral, Daily (standard)    escitalopram oxalate (LEXAPRO) 10 mg, Oral, Daily (standard)    hydrocortisone 1 % cream Apply to affected area 2 times daily    levalbuterol (XOPENEX) 0.31 mg, Nebulization, Every 4 hours PRN    nifedipine 0.3% lidocaine 1.5% in petrolatum ointment 1 application., Topical, Daily (standard)    prochlorperazine (COMPAZINE) 10 mg, Oral, Every 6 hours PRN    SpryceL 70 mg, Oral, Daily (standard)    valACYclovir (VALTREX) 500 mg, Oral, Daily (standard)       Objective:   Vitals: Temp:  [36.9 ??C (98.4 ??F)] 36.9 ??C (98.4 ??F)  Heart Rate:  [99] 99  Resp:  [18] 18  BP: (132)/(72) 132/72  BMI (Calculated):  [29.14] 29.14    Physical Exam:  General: Resting, in no apparent distress, sitting in chair  HEENT:  PERRL. No scleral icterus or conjunctival injection. MMM without ulceration, erythema or exudate.   Heart:  RRR. S1, S2. No murmurs, gallops, or rubs.  Lungs:  Breathing is unlabored, and patient is speaking full sentences with ease.  No stridor. CTAB. No rales, ronchi, or crackles.    Abdomen:  No distention or pain on palpation.   Skin:  No rashes, petechiae or purpura.  No areas of skin breakdown.   Musculoskeletal:  No grossly-evident joint effusions or deformities.    Psychiatric:  Range of affect is appropriate.    Neurologic:  Alert and oriented to person, place, time and situation.    Extremities:  Appear well-perfused. No clubbing, edema, or cyanosis.  CVAD: R CW Port - no erythema, nontender; dressing CDI.      Test Results  Recent Labs     02/16/22  1501   WBC 11.6*   HGB 9.2*   PLT 121*     Recent Labs     02/16/22  1501   NA 142   K 3.8   CL 108*   CO2 29.0   BUN 12   CREATININE 0.54*   CALCIUM 8.8         DVT PPX Indicated: No  - fluids: yes  - electrolytes: stable   - diet: regular     Need for PT: no  Anticipated Discharge: Their home    Code Status:   Full Code      Time spent on counseling/coordination of care: 1 Hour 30 Minutes  Total time spent with patient: 1 Hour    Jackqulyn Livings, PA-S2    Irena Cords, PA-C  02/16/2022  6:30 PM   Physician Assistant   Hematology/Oncology Department   [facility]   Group Pager: [phone]     I attest that I have reviewed the student note and that the components of the history of the present illness, the physical exam, and the assessment and plan documented were performed by me or were performed in my presence by the student where I verified the documentation and performed (or re-performed) the exam and medical decision making.

## 2022-02-16 NOTE — Unmapped (Unsigned)
Patient here for a scheduled admission. Family at chairside. DL port to [facility] accessed. Both lumens accessed with power needles and CHG placed to sites. Sites flushed easily with positive blood return. Of note, lateral lumen flushes easily however, blood return is intermittent. No labs required while in infusion today. Patient resting in chair and denies complaints at this time. Personal belongs and call bell at chairside.

## 2022-02-17 LAB — CBC W/ AUTO DIFF
BASOPHILS ABSOLUTE COUNT: 0 10*9/L (ref 0.0–0.1)
BASOPHILS RELATIVE PERCENT: 0.5 %
EOSINOPHILS ABSOLUTE COUNT: 0.1 10*9/L (ref 0.0–0.5)
EOSINOPHILS RELATIVE PERCENT: 1 %
HEMATOCRIT: 24.4 % — ABNORMAL LOW (ref 34.0–44.0)
HEMOGLOBIN: 8.6 g/dL — ABNORMAL LOW (ref 11.3–14.9)
LYMPHOCYTES ABSOLUTE COUNT: 1.6 10*9/L (ref 1.1–3.6)
LYMPHOCYTES RELATIVE PERCENT: 27.6 %
MEAN CORPUSCULAR HEMOGLOBIN CONC: 35.1 g/dL (ref 32.0–36.0)
MEAN CORPUSCULAR HEMOGLOBIN: 33.4 pg — ABNORMAL HIGH (ref 25.9–32.4)
MEAN CORPUSCULAR VOLUME: 95 fL (ref 77.6–95.7)
MEAN PLATELET VOLUME: 6.8 fL (ref 6.8–10.7)
MONOCYTES ABSOLUTE COUNT: 0.9 10*9/L — ABNORMAL HIGH (ref 0.3–0.8)
MONOCYTES RELATIVE PERCENT: 15.1 %
NEUTROPHILS ABSOLUTE COUNT: 3.2 10*9/L (ref 1.8–7.8)
NEUTROPHILS RELATIVE PERCENT: 55.8 %
PLATELET COUNT: 154 10*9/L (ref 150–450)
RED BLOOD CELL COUNT: 2.57 10*12/L — ABNORMAL LOW (ref 3.95–5.13)
RED CELL DISTRIBUTION WIDTH: 26.3 % — ABNORMAL HIGH (ref 12.2–15.2)
WBC ADJUSTED: 5.7 10*9/L (ref 3.6–11.2)

## 2022-02-17 LAB — URINALYSIS WITH MICROSCOPY
BACTERIA: NONE SEEN /HPF
BILIRUBIN UA: NEGATIVE
BLOOD UA: NEGATIVE
GLUCOSE UA: NEGATIVE
KETONES UA: NEGATIVE
LEUKOCYTE ESTERASE UA: NEGATIVE
NITRITE UA: NEGATIVE
PH UA: 8.5 (ref 5.0–9.0)
PROTEIN UA: NEGATIVE
RBC UA: <1 /HPF (ref <=4)
SPECIFIC GRAVITY UA: 1.007 (ref 1.003–1.030)
SQUAMOUS EPITHELIAL: <1 /HPF (ref 0–5)
UROBILINOGEN UA: <2
WBC UA: 2 /HPF (ref 0–5)

## 2022-02-17 LAB — BASIC METABOLIC PANEL
ANION GAP: 8 mmol/L (ref 5–14)
BLOOD UREA NITROGEN: 15 mg/dL (ref 9–23)
BUN / CREAT RATIO: 23
CALCIUM: 8.6 mg/dL — ABNORMAL LOW (ref 8.7–10.4)
CHLORIDE: 108 mmol/L — ABNORMAL HIGH (ref 98–107)
CO2: 27 mmol/L (ref 20.0–31.0)
CREATININE: 0.66 mg/dL
EGFR CKD-EPI (2021) FEMALE: >90 mL/min/{1.73_m2} (ref >=60)
GLUCOSE RANDOM: 99 mg/dL (ref 70–179)
POTASSIUM: 3.8 mmol/L (ref 3.4–4.8)
SODIUM: 143 mmol/L (ref 135–145)

## 2022-02-17 LAB — HEPATIC FUNCTION PANEL
ALBUMIN: 3.4 g/dL (ref 3.4–5.0)
ALKALINE PHOSPHATASE: 90 U/L (ref 46–116)
ALT (SGPT): 117 U/L — ABNORMAL HIGH (ref 10–49)
AST (SGOT): 59 U/L — ABNORMAL HIGH (ref <=34)
BILIRUBIN DIRECT: 0.2 mg/dL (ref 0.00–0.30)
BILIRUBIN TOTAL: 0.7 mg/dL (ref 0.3–1.2)
PROTEIN TOTAL: 5.5 g/dL — ABNORMAL LOW (ref 5.7–8.2)

## 2022-02-17 LAB — HCG QUANTITATIVE, BLOOD: GONADOTROPIN, CHORIONIC (HCG) QUANT: <2.6 m[IU]/mL

## 2022-02-17 MED ADMIN — methylPREDNISolone sodium succinate (PF) (SOLU-medrol) injection 50 mg: 50 mg | INTRAVENOUS | @ 17:00:00 | Stop: 2022-02-18

## 2022-02-17 MED ADMIN — sodium bicarbonate 150 mEq in dextrose 5 % 1,150 mL continuous infusion: 150 meq | INTRAVENOUS | @ 12:00:00

## 2022-02-17 MED ADMIN — acetaZOLAMIDE (DIAMOX) tablet 500 mg: 500 mg | ORAL | @ 13:00:00 | Stop: 2022-02-17

## 2022-02-17 MED ADMIN — sodium bicarbonate 150 mEq in dextrose 5 % 1,150 mL continuous infusion: 150 meq | INTRAVENOUS | @ 19:00:00

## 2022-02-17 MED ADMIN — acetaZOLAMIDE (DIAMOX) tablet 500 mg: 500 mg | ORAL | @ 18:00:00 | Stop: 2022-02-17

## 2022-02-17 MED ADMIN — methotrexate (CONTAINS PRESERVATIVES) 360 mg in sodium chloride (NS) 0.9 % 250 mL IVPB: 360 mg | INTRAVENOUS | @ 20:00:00 | Stop: 2022-02-17

## 2022-02-17 MED ADMIN — dapsone tablet 100 mg: 100 mg | ORAL | @ 01:00:00

## 2022-02-17 MED ADMIN — dapsone tablet 100 mg: 100 mg | ORAL | @ 13:00:00

## 2022-02-17 MED ADMIN — cetirizine (ZyrTEC) tablet 10 mg: 10 mg | ORAL | @ 13:00:00

## 2022-02-17 MED ADMIN — sodium chloride (NS) 0.9 % flush 10 mL: 10 mL | INTRAVENOUS | @ 01:00:00

## 2022-02-17 MED ADMIN — dasatinib (SPRYCEL) tablet 70 mg: 70 mg | ORAL | @ 22:00:00

## 2022-02-17 MED ADMIN — methotrexate (CONTAINS PRESERVATIVES) 390 mg in sodium chloride (NS) 0.9 % 250 mL IVPB: 390 mg | INTRAVENOUS | @ 18:00:00 | Stop: 2022-02-17

## 2022-02-17 MED ADMIN — valACYclovir (VALTREX) tablet 500 mg: 500 mg | ORAL | @ 13:00:00

## 2022-02-17 MED ADMIN — ondansetron (ZOFRAN) tablet 24 mg: 24 mg | ORAL | @ 17:00:00 | Stop: 2022-02-17

## 2022-02-17 MED ADMIN — sodium chloride (NS) 0.9 % flush 10 mL: 10 mL | INTRAVENOUS | @ 13:00:00

## 2022-02-17 NOTE — Unmapped (Signed)
High-Dose Methotrexate Therapeutic Monitoring Pharmacy Note    Patricia [name] is a 46 y.o. 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    Dosing Information:  Methotrexate Dose: 1 g/m2   Dose reduced? No  Date/Time of Initiation: 02/17/22 at 1406    Urinary Alkalinization Therapy: Acetazolamide 500 mg PO Q6H x 2 doses, followed by sodium bicarbonate 150 mEq in 1L D5W @ 150 mL/hr    Goals:  Methotrexate Levels  Monitor methotrexate levels with leucovorin dose adjusted per HyperCVAD protocol    Additional Clinical Monitoring/Outcomes  Maintain urine pH >/= 7 with serum CO2 </= 40  Monitor renal function (SCr and urine output)   Monitor for signs/symptoms of adverse events (e.g., myelosuppression, diarrhea, mucositis, neurotoxicity)    Results:   Lab Results   Component Value Date    CREATININE 0.66 02/17/2022     Urine pH: 8.5    Pharmacokinetic Considerations and Significant Drug Interactions:  Concurrent nephrotoxic medications: None identified  Medications that delay methotrexate clearance: None identified    Assessment/Plan:  Urinary Alkalinization  Current urine pH is at goal (>/= 7); recommend to continue current urinary alkalinization therapy    Methotrexate Level Monitoring and Leucovorin Dosing  Obtain a serum methotrexate level 24 hours after the initiation of methotrexate infusion (methotrexate level due 02/18/22 @ 1406). Thereafter, obtain a methotrexate level with morning labs until methotrexate level is < 0.05 micromolar.   Methotrexate levels with leucovorin dose adjusted at 24 hours as follows:  Methotrexate level < 20 micromolar, continue leucovorin dose of 50 mg IV x 1 dose, followed by 25 mg PO Q6H to begin 12 hours after completion of methotrexate infusion  Methotrexate level > 20 micromolar, increase to 50 mg IV Q6H and start immediately    Cytarabine Dose Adjustment  If the 24-hour methotrexate level is > 20 micromolar or the SCr is > 2 mg/dL (>=6.0 mg/dL if Study [id number] HyperCVAD), reduce the cytarabine dose to 1 g/m2   Do not initiate cytarabine until the methotrexate level is < 20 micromolar    Follow-up  Please avoid using proton pump inhibitors, phenytoin, folic acid, trimethoprim/sulfamethoxazole, NSAIDs, aminoglycosides, and penicillins. Use ACE inhibitors, ARBs, fluoroquinolones and other beta lactam antibiotics with caution.   A pharmacist will continue to monitor and recommend supportive care as appropriate    Please page service pharmacist with questions/clarifications.    Mat Carne, PharmD

## 2022-02-17 NOTE — Unmapped (Signed)
Pt admitted for scheduled cycle 4 of chemotherapy treatment plan. Labs drawn per orders. Denies pain/n/v/d. No acute events overnight.   Problem: Adult Inpatient Plan of Care  Goal: Plan of Care Review  Outcome: Ongoing - Unchanged  Goal: Patient-Specific Goal (Individualized)  Outcome: Ongoing - Unchanged  Goal: Absence of Hospital-Acquired Illness or Injury  Outcome: Ongoing - Unchanged  Intervention: Identify and Manage Fall Risk  Recent Flowsheet Documentation  Taken 02/16/2022 1905 by Earlean Shawl, RN  Safety Interventions:  ??? bleeding precautions  ??? fall reduction program maintained  ??? family at bedside  ??? lighting adjusted for tasks/safety  ??? low bed  ??? nonskid shoes/slippers when out of bed  Intervention: Prevent Skin Injury  Recent Flowsheet Documentation  Taken 02/16/2022 1905 by Earlean Shawl, RN  Skin Protection: adhesive use limited  Intervention: Prevent and Manage VTE (Venous Thromboembolism) Risk  Recent Flowsheet Documentation  Taken 02/16/2022 1905 by Earlean Shawl, RN  Activity Management:  ??? activity adjusted per tolerance  ??? up ad lib  Goal: Optimal Comfort and Wellbeing  Outcome: Ongoing - Unchanged  Goal: Readiness for Transition of Care  Outcome: Ongoing - Unchanged  Intervention: Mutually Develop Transition Plan  Recent Flowsheet Documentation  Taken 02/16/2022 1950 by Earlean Shawl, RN  Transportation Anticipated: family or friend will provide  Goal: Rounds/Family Conference  Outcome: Ongoing - Unchanged

## 2022-02-17 NOTE — Unmapped (Signed)
Daily Progress Note      Principal Problem:    B-cell acute lymphoblastic leukemia (ALL) (CMS-HCC)       LOS: 1 day     Assessment/Plan:Assessment/Plan: 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     Today's plan: D1 C4B HyperCVAD=7/20. IV Sodium Bicarbonate started this morning.  Plan to start IV Methotrexate this morning following urine alkalinization, as patient is now 24 hrs from last dose of Augmentin.  Ongoing sinus congestion with frequent sinus infections will get CT sinus wo contrast and consult ENT to assess while in patient, appreciate assistance. IT in [facility] tomorrow, clarification placed.     Ph+ALL, in remission: Initially diagnosed in 09/2021 and underwent [id number] induction complicated by PICC line associated superficial thrombus. BMBx on 5/2 revealed morphologic CR. Admitted for Cycle 4B HyperCVAD + Dasatinib. Patient has completed #6 IT chemos prior to admission, with plan to do one per cycle due to intolerance of two/cycle.  - D1=7/20 Methotrexate, delayed due DDI with penicillin -   - Dasatinib 70 mg daily   - ppx Dapsone and Valtrex   - IT Cytarabine on 7/21 in [place]     Regimen: R-HyperCVAD  Cycle # 4B  Primary Oncologist: Senaida Ores     Even Cycles  Date  7/20           Day 1 2 3 4 5    Methotrexate IV 1000mg /m2 x           Cytarabine 3g/m2 Q12H   X  x X  x       Methotrexate IT 12mg    x            EVEN CYCLE DETAILS  - OK to treat placed to be placed on 7/20  - bicarb continuous infusion until MTX level <0.05  - Leucovorin titration until MTX level <0.05   - daily UA to maintain pH >/= 7.0      Disposition EVEN:   - NO G-CSF on discharge due to leukocytosis and malaise   - OP Rituxan - scheduled for 7/24 - needs to be adjusted   - Three times weekly labs - needs to be requested   - Dr. Senaida Ores follow up on 8/10 - scheduled      Acute on chronic sinusitis: Started Augmentin 875-125mg  PO BID x 10 days. Day 1 02/09/22 . Symptoms improving since starting augmentin. Due to interaction between methotrexate and penicillins, we will stop Augmentin. She has completed 7 full days and 1 dose of the 8th day, which is sufficient treatment for sinusitis. Patient voiced concern of pneumonia developing due to a history of pneumonia after a 7 day course. After lengthy discussion of risk and benefits of delaying chemotherapy further, patient agreeable to stop Augmentin and begin chemotherapy. We will continue to monitor.   [ ]  CT sinus w/o due to HDMTX infusion   [ ]  Consult to ENT  - Stop Augmentin on admission       Hemorrhoids: Symptoms significantly improved with OTC Dr. Silvana Newness ointment. Given improvement, patient would like to defer GI consult at this time.   - Continue OTC ointment      Grade 1 peripheral neuropathy: Paraesthesias present primarily in her finger pads, currently without medical interventions continue to monitor while on therapy. Not affecting ADLs.     Anxiety: Reports taking Lexapro 10 mg at home  - Continue Lexapro 10mg  daily  on admission     Immunocompromised status: Patient is immunocompromised secondary to disease and chemotherapy  -Antimicrobial prophylaxis as above     Impending Electrolyte Abnormality Secondary to Chemotherapy and/or IV Fluids  -Daily Electrolyte monitoring  -Replete per [facility] guidelines.      Impending Pancytopenia secondary to Acute Leukemia/chemotherapy:    - Transfuse 1 unit of pRBCs for hgb >7  - Transfuse 1 unit plt for plts >10K     Nutrition:                        Subjective:     Interval History: Patient reports feeling generally well, no acute complaints.  Sinus congestion and drainage per baseline. Slight worsening of cough.    Denies headache, changes in vision, sore throat, SOB, chest pain, abdominal pain, N/V/D/C, changes in urination, fevers, night sweats or chills.       10 point ROS otherwise negative except as above in the HPI.     Objective:     Vital signs in last 24 hours:  Temp:  [36.9 ??C (98.4 ??F)-37.3 ??C (99.2 ??F)] (P) 37.2 ??C (98.9 ??F)  Heart Rate:  [98-110] (P) 104  Resp:  [18-20] (P) 18  BP: (97-132)/(60-82) (P) 113/81  MAP (mmHg):  [73-95] (P) 90  SpO2:  [91 %-99 %] (P) 99 %  BMI (Calculated):  [29.14] 29.14    Intake/Output last 3 shifts:  No intake/output data recorded.    Meds:  Current Facility-Administered Medications   Medication Dose Route Frequency Provider Last Rate Last Admin    acetaminophen (TYLENOL) tablet 650 mg  650 mg Oral Q8H PRN Maryanna Shape, PA        acetaZOLAMIDE (DIAMOX) tablet 500 mg  500 mg Oral Q6H [name], MD   500 mg at 02/17/22 1610    cetirizine (ZyrTEC) tablet 10 mg  10 mg Oral Daily Maryanna Shape, [place]   10 mg at 02/17/22 9604    CHEMO CLARIFICATION ORDER   Other Continuous PRN [name], MD        [START ON 02/19/2022] cytarabine (PF) (ARA-C) 100 mg, hydrocortisone sod succ (Solu-CORTEF) 50 mg in sodium chloride (NS) 0.9 % 4 mL INTRATHECAL syringe   Intrathecal Once [name], MD        [START ON 02/18/2022] cytarabine (PF) (ARA-C) 5,850 mg in sodium chloride (NS) 0.9 % 250 mL IVPB  3 g/m2 (Treatment Plan Recorded) Intravenous Q12H [name], MD        dapsone tablet 100 mg  100 mg Oral Daily Maryanna Shape, PA   100 mg at 02/17/22 5409    dasatinib (SPRYCEL) tablet 70 mg  70 mg Oral Daily [name], MD        diphenhydrAMINE (BENADRYL) injection 25 mg  25 mg Intravenous Q4H PRN [name], MD        emollient combination no.92 (LUBRIDERM) lotion 1 application.  1 application. Topical Q1H PRN Maryanna Shape, PA        EPINEPHrine [facility]) injection 0.3 mg  0.3 mg Intramuscular Daily PRN [name], MD        escitalopram oxalate (LEXAPRO) tablet 10 mg  10 mg Oral Daily Maryanna Shape, [place]        famotidine (PF) (PEPCID) injection 20 mg  20 mg Intravenous Q4H PRN [name], MD        IP OKAY TO TREAT  Other Continuous PRN Herbert Moors, [place]        [START ON 02/19/2022] leuCOVorin (WELLCOVORIN) tablet 25 mg  25 mg Oral Q6H [name], MD        Melene Muller ON 02/19/2022] leucovorin syringe  50 mg Intravenous Once [name], MD        levalbuterol Pauline Aus) nebulizer solution 0.31 mg  1 ampule Nebulization Q4H PRN Maryanna Shape, PA        loperamide (IMODIUM) capsule 2 mg  2 mg Oral Q2H PRN Maryanna Shape, PA        loperamide (IMODIUM) capsule 4 mg  4 mg Oral Once PRN Maryanna Shape, PA        meperidine (DEMEROL) injection 25 mg  25 mg Intravenous Q30 Min PRN [name], MD        methotrexate (CONTAINS PRESERVATIVES) 360 mg in sodium chloride (NS) 0.9 % 250 mL IVPB  360 mg Intravenous Once [name], MD        methotrexate (CONTAINS PRESERVATIVES) 390 mg in sodium chloride (NS) 0.9 % 250 mL IVPB  390 mg Intravenous Once [name], MD        methotrexate (Preservative Free) 1,200 mg in sodium chloride (NS) 0.9 % 1,000 mL IVPB  1,200 mg Intravenous Once [name], MD        methylPREDNISolone sodium succinate (PF) (SOLU-medrol) injection 125 mg  125 mg Intravenous Q4H PRN [name], MD        methylPREDNISolone sodium succinate (PF) (SOLU-medrol) injection 50 mg  50 mg Intravenous Q12H [name], MD        [START ON 02/18/2022] methylPREDNISolone sodium succinate (PF) (SOLU-medrol) injection 50 mg  50 mg Intravenous Q12H [name], MD        ondansetron [facility]) tablet 24 mg  24 mg Oral Once [name], MD        Melene Muller ON 02/18/2022] ondansetron (ZOFRAN) tablet 24 mg  24 mg Oral Q24H [name], MD        Melene Muller ON 02/18/2022] prednisoLONE acetate (PRED FORTE) 1 % ophthalmic suspension 2 drop  2 drop Both Eyes QID [name], MD        prochlorperazine (COMPAZINE) injection 10 mg  10 mg Intravenous Q6H PRN [name], MD        prochlorperazine (COMPAZINE) tablet 10 mg  10 mg Oral Q6H PRN [name], MD        sodium bicarbonate 150 mEq in dextrose 5 % 1,150 mL continuous infusion  150 mEq Intravenous Continuous [name], MD 150 mL/hr at 02/17/22 0751 150 mEq at 02/17/22 0751    sodium chloride (NS) 0.9 % flush 10 mL  10 mL Intravenous BID Maryanna Shape, PA        sodium chloride (NS) 0.9 % flush 10 mL  10 mL Intravenous BID Maryanna Shape, PA   10 mL at 02/16/22 2100    sodium chloride (NS) 0.9 % flush 10 mL  10 mL Intravenous BID Maryanna Shape, PA   10 mL at 02/17/22 8119    sodium chloride (NS) 0.9 % infusion  20 mL/hr Intravenous Continuous PRN [name], MD        sodium chloride (NS) 0.9 % infusion  20 mL/hr Intravenous Continuous Maryanna Shape, PA        sodium chloride 0.9% (NS) bolus 1,000 mL  1,000  mL Intravenous Daily PRN [name], MD        valACYclovir (VALTREX) tablet 500 mg  500 mg Oral Daily Maryanna Shape, [place]   500 mg at 02/17/22 1610       Physical Exam:  General: Resting, in no apparent distress, lying in bed  HEENT:  PERRL. No scleral icterus or conjunctival injection. MMM without ulceration, erythema or exudate. No cervical or axillary lymphadenopathy.   Heart:  RRR. S1, S2. No murmurs, gallops, or rubs.  Lungs:  Breathing is unlabored, and patient is speaking full sentences with ease.  No stridor.  CTAB. No rales, ronchi, or crackles.    Abdomen:  No distention or pain on palpation.  Bowel sounds are present and normoactive x 4.  No palpable hepatomegaly or splenomegaly.  No palpable masses.  Skin:  No rashes, petechiae or purpura.  No areas of skin breakdown. Warm to touch, dry, smooth, and even.  Musculoskeletal:  No grossly-evident joint effusions or deformities.  Range of motion about the shoulder, elbow, hips and knees is grossly normal.  Psychiatric:  Range of affect is appropriate.    Neurologic:  Alert and oriented to person, place, time and situation.  Gait is normal.  No Nystagmus Cerebellar tasks (finger-to-nose, rapid hand movement, heel along shin) are completed with ease and are symmetric. CNII-CNXII grossly intact.  Extremities:  Appear well-perfused. No clubbing, edema, or cyanosis.  CVAD: R CW Port - no erythema, nontender; dressing CDI.      Labs:  Recent Labs     02/16/22  1501 02/17/22  0218   WBC 11.6* 5.7   NEUTROABS 5.2 3.2   LYMPHSABS 4.9* 1.6   HGB 9.2* 8.6*   HCT 26.6* 24.4*   PLT 121* 154   CREATININE 0.54* 0.66   BUN 12 15   BILITOT 0.7 0.7   BILIDIR  --  0.20   AST 67* 59*   ALT 137* 117*   ALKPHOS 94 90   K 3.8 3.8   CALCIUM 8.8 8.6*   NA 142 143   CL 108* 108*   CO2 29.0 27.0       Imaging:  CT sinus pending    Herbert Moors, AGNP-C  02/17/2022  12:41 PM   Nurse Practitioner  Hematology/Oncology Department   [facility]   Group pager: [phone]

## 2022-02-17 NOTE — Unmapped (Signed)
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    -Follow-up CT sinus without contrast.  -Nasal endoscopy revealed edematous mucosa bilaterally.  No signs of active infection, purulence, polyposis, or masses.  -Patient seen in the past by outside ENT with a normal CT scan at that point, and referred for neurology for possible headache/migraine work-up.  -Recommend strong nasal hygiene regimen. Patient should start nasal irrigations twice a day. Discussed with patient to make sure to use irrigaitons before using the nasal sprays. Recommend starting nasal steroid spray. Patient should use 2 sprays each nostril twice a day. Typically recommend Flonase, however will defer to the oncology team for the appropriateness of topical steroids in the setting of ongoing chemotherapy.  -We will plan to follow-up in the outpatient setting.  No acute intervention from ENT standpoint.    The patient will be staffed with Irene Pap MD Thank you for inviting [place] to assist in the care of your patient.  Please page the Otolaryngology consult pager at [phone] with questions/concerns.    History of Present Illness  Patricia [name] is seen in consultation at the request of [facility] Crosby Dittu* for chronic sinusitis.     46 year old female with a history of ALL admitted for chemotherapy who has a history of being treated for multiple sinus infections.  Her sinus infections present as facial pressure, pain, headache.  She was recently diagnosed by her primary physician with a sinus infection and was given a course of Augmentin which she has completed 7 days.  Due to interactions with her chemotherapy, Augmentin was stopped on admission.  Per chart review she has previously been seen by an outside ENT in 2021 where they are evaluated for recurrent sinusitis.  Per documentation, she had a CT scan that was relatively normal and without signs of active, acute or chronic sinusitis.  The concern at that time was that she was having headaches that were presenting as sinusitis, and was recommended referral to neurology for migraine/headache work-up.  She states that she has experienced over 200 sinus infections.  She states that she previously had a clinic where she would call in with symptoms and they would prescribe antibiotics, therefore unsure of assessment of those sinus infections at that time.  However she has been receiving antibiotics almost every time she has facial pressure pain symptoms.  She states she has tried nasal sprays and irrigations in the past, however she believes that those have given her sinus infections in the past she also states that Flonase does not work in the past.  She also states that she feels as if Afrin is the only thing that helps, and that she has used it extensively in the past but has not used that for over a month.  She does endorse that lifting her nasal valves improves nasal airflow, but states that Breathe Right strips do not help.  We discussed extensively nasal hygiene and the proper use of irrigations as well as Flonase.    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    Past Surgical History   has a past surgical history that includes IR Insert Port Age Greater  Than 5 Years (12/08/2021).    Family History  family history is not on file.    Social History:  Tobacco use:   Social History     Tobacco Use   Smoking Status Never   Smokeless Tobacco Never     Alcohol use:   Social History     Substance and Sexual Activity   Alcohol Use None     Drug use:   Social History     Substance and Sexual Activity   Drug Use Not on file       Allergies  Allergies   Allergen Reactions   ??? Erythromycin Hives   ??? Other      Pt cant take any pain reliever or fever reducer due to condition.   ??? Sulfa (Sulfonamide Antibiotics) Anaphylaxis   ??? Azithromycin        Medications    No current facility-administered medications on file prior to encounter.     Current Outpatient Medications on File Prior to Encounter   Medication Sig Dispense Refill   ??? acetaminophen (TYLENOL 8 HOUR) 650 MG CR tablet Take 1 tablet (650 mg total) by mouth every eight (8) hours as needed for pain.     ??? amoxicillin-clavulanate (AUGMENTIN) 875-125 mg per tablet Take 1 tablet by mouth Two (2) times a day for 10 days. 20 tablet 0   ??? cetirizine (ZYRTEC) 10 MG tablet Take 1 tablet (10 mg total) by mouth nightly. 30 tablet 2   ??? dapsone 100 MG tablet Take 1 tablet (100 mg total) by mouth daily. 30 tablet 2   ??? dasatinib (SPRYCEL) 70 MG tablet Take 1 tablet (70 mg total) by mouth daily. 30 tablet 7   ??? [EXPIRED] dexAMETHasone (DECADRON) 4 MG tablet Take 10 tablets (40 mg total) by mouth daily for 4 days. 40 tablet 0   ??? escitalopram oxalate (LEXAPRO) 10 MG tablet Take 1 tablet (10 mg total) by mouth daily. 90 tablet 0   ??? hydrocortisone 1 % cream Apply to affected area 2 times daily 30 g 1   ??? levalbuterol (XOPENEX) 0.31 mg/3 mL nebulizer solution Inhale 3 mL (0.31 mg total) by nebulization every four (4) hours as needed for wheezing (cough). 45 mL 0   ??? nifedipine 0.3% lidocaine 1.5% in petrolatum ointment Apply 1 application. topically daily. 100 g 0   ??? prochlorperazine (COMPAZINE) 10 MG tablet Take 1 tablet (10 mg total) by mouth every six (6) hours as needed for up to 14 days. 30 tablet 0   ??? valACYclovir (VALTREX) 500 MG tablet Take 1 tablet (500 mg total) by mouth daily. 90 tablet 3       Review of Systems  Review of Systems:  As above and, otherwise the balance of 11 systems was negative.    Objective:     Vital Signs  Temp:  [37.1 ??C (98.8 ??F)-37.3 ??C (99.2 ??F)] (P) 37.2 ??C (98.9 ??F)  Heart Rate:  [98-110] (P) 104  Resp:  [18-20] (P) 18  BP: (97-131)/(60-82) (P) 113/81  MAP (mmHg):  [73-95] (P) 90  SpO2:  [91 %-99 %] (P) 99 %    Physical Exam  Constitutional:  Vitals reviewed on nursing chart  General: sitting up in NAD, normal appearance and voice  Respiration:  breathing comfortably on room air; no stridor/stertor  CV: extremities well-perfused, regular rate and rhythm  Eyes:  EOM Intact, sclera anicteric, no conjunctival injection  Neuro:  Alert and oriented times 3, Cranial nerves 2-12 intact  and symmetric bilaterally.   Head and Face:  Skin with no masses or lesions, sinuses nontender to palpation, facial nerve fully intact, sensation intact throughout all distribution of CNV  Ears:  Normal external ears, normal hearing to whispered voice.   Nose:  External nose midline, anterior rhinoscopy is normal with limited visualization just to the anterior interior turbinate.   Oral Cavity/Lips: normal mucosa, no lesions, tongue mobile, healthy dentition and gingiva, palate sensate  Neck/Lymph:  Neck soft, flat, trachea midline.      PROCEDURE: SINONASAL ENDOSCOPY (CPT [id number]):   To better evaluate the patient's symptoms/site, sinonasal endoscopy is indicated.  After discussion of risks and benefits verbal consent was obtained. An endoscope was used to perform nasal endoscopy on each side. A time out identifying the patient, the procedure, the location of the procedure and any concerns was performed prior to beginning the procedure.    Findings:   Examination on the left reveals an intact nasal septum with no associated masses, lesions, or friable mucosa. The left middle meatus, sphenoethmoidal recess, and skull base are clear with no evidence of purulence, polyposis, or polypoid edema.  Mucosa appears generally edematous, no evidence of necrosis or active infection.    Examination on the right reveals an intact nasal septum with no associated masses, lesions, or friable mucosa. The right middle meatus, sphenoethmoidal recess, and skull base are clear with no evidence of purulence, polyposis, or polypoid edema. Mucosa appears generally edematous, no evidence of necrosis or active infection.      Test Results  Labs:   Recent Results (from the past 24 hour(s))   Comprehensive Metabolic Panel    Collection Time: 02/16/22  3:01 PM   Result Value Ref Range    Sodium 142 135 - 145 mmol/L    Potassium 3.8 3.4 - 4.8 mmol/L    Chloride 108 (H) 98 - 107 mmol/L    CO2 29.0 20.0 - 31.0 mmol/L    Anion Gap 5 5 - 14 mmol/L    BUN 12 9 - 23 mg/dL    Creatinine 1.61 (L) 0.60 - 0.80 mg/dL    BUN/Creatinine Ratio 22     eGFR CKD-EPI (2021) Female >90 >=60 mL/min/1.50m2    Glucose 120 70 - 179 mg/dL    Calcium 8.8 8.7 - 09.6 mg/dL    Albumin 3.6 3.4 - 5.0 g/dL    Total Protein 5.9 5.7 - 8.2 g/dL    Total Bilirubin 0.7 0.3 - 1.2 mg/dL    AST 67 (H) <=04 U/L    ALT 137 (H) 10 - 49 U/L    Alkaline Phosphatase 94 46 - 116 U/L   CBC w/ Differential    Collection Time: 02/16/22  3:01 PM   Result Value Ref Range    WBC 11.6 (H) 3.6 - 11.2 10*9/L    RBC 2.82 (L) 3.95 - 5.13 10*12/L    HGB 9.2 (L) 11.3 - 14.9 g/dL    HCT 54.0 (L) 98.1 - 44.0 %    MCV 94.4 77.6 - 95.7 fL    MCH 32.5 (H) 25.9 - 32.4 pg    MCHC 34.4 32.0 - 36.0 g/dL    RDW 19.1 (H) 47.8 - 15.2 %    MPV 6.7 (L) 6.8 - 10.7 fL    Platelet 121 (L) 150 - 450 10*9/L    Neutrophils % 45.3 %    Lymphocytes % 42.2 %    Monocytes % 11.1 %    Eosinophils % 0.5 %  Basophils % 0.9 %    Absolute Neutrophils 5.2 1.8 - 7.8 10*9/L    Absolute Lymphocytes 4.9 (H) 1.1 - 3.6 10*9/L    Absolute Monocytes 1.3 (H) 0.3 - 0.8 10*9/L    Absolute Eosinophils 0.1 0.0 - 0.5 10*9/L    Absolute Basophils 0.1 0.0 - 0.1 10*9/L    Anisocytosis Marked (A) Not Present   Morphology Review    Collection Time: 02/16/22  3:01 PM   Result Value Ref Range    Smear Review Comments See Comment (A) Undefined    Neutrophil Left Shift Present (A) Not Present   RAPID INFLUENZA/RSV/COVID PCR Collection Time: 02/16/22  6:17 PM    Specimen: Nasopharyngeal Swab   Result Value Ref Range    SARS-CoV-2 PCR Negative Negative    Influenza A Negative Negative    Influenza B Negative Negative    RSV Negative Negative   HCG Quantitative, Blood    Collection Time: 02/17/22  2:18 AM   Result Value Ref Range    hCG Quantitative <2.6 mIU/mL   Type and Screen on admission    Collection Time: 02/17/22  2:18 AM   Result Value Ref Range    ABO Grouping O POS     Antibody Screen NEG    Hepatic Function Panel    Collection Time: 02/17/22  2:18 AM   Result Value Ref Range    Albumin 3.4 3.4 - 5.0 g/dL    Total Protein 5.5 (L) 5.7 - 8.2 g/dL    Total Bilirubin 0.7 0.3 - 1.2 mg/dL    Bilirubin, Direct 1.61 0.00 - 0.30 mg/dL    AST 59 (H) <=09 U/L    ALT 117 (H) 10 - 49 U/L    Alkaline Phosphatase 90 46 - 116 U/L   Basic Metabolic Panel    Collection Time: 02/17/22  2:18 AM   Result Value Ref Range    Sodium 143 135 - 145 mmol/L    Potassium 3.8 3.4 - 4.8 mmol/L    Chloride 108 (H) 98 - 107 mmol/L    CO2 27.0 20.0 - 31.0 mmol/L    Anion Gap 8 5 - 14 mmol/L    BUN 15 9 - 23 mg/dL    Creatinine 6.04 5.40 - 0.80 mg/dL    BUN/Creatinine Ratio 23     eGFR CKD-EPI (2021) Female >90 >=60 mL/min/1.44m2    Glucose 99 70 - 179 mg/dL    Calcium 8.6 (L) 8.7 - 10.4 mg/dL   CBC w/ Differential    Collection Time: 02/17/22  2:18 AM   Result Value Ref Range    WBC 5.7 3.6 - 11.2 10*9/L    RBC 2.57 (L) 3.95 - 5.13 10*12/L    HGB 8.6 (L) 11.3 - 14.9 g/dL    HCT 98.1 (L) 19.1 - 44.0 %    MCV 95.0 77.6 - 95.7 fL    MCH 33.4 (H) 25.9 - 32.4 pg    MCHC 35.1 32.0 - 36.0 g/dL    RDW 47.8 (H) 29.5 - 15.2 %    MPV 6.8 6.8 - 10.7 fL    Platelet 154 150 - 450 10*9/L    Neutrophils % 55.8 %    Lymphocytes % 27.6 %    Monocytes % 15.1 %    Eosinophils % 1.0 %    Basophils % 0.5 %    Absolute Neutrophils 3.2 1.8 - 7.8 10*9/L    Absolute Lymphocytes 1.6 1.1 - 3.6 10*9/L    Absolute  Monocytes 0.9 (H) 0.3 - 0.8 10*9/L    Absolute Eosinophils 0.1 0.0 - 0.5 10*9/L    Absolute Basophils 0.0 0.0 - 0.1 10*9/L    Anisocytosis Marked (A) Not Present   Urinalysis with Microscopy    Collection Time: 02/17/22 12:17 PM   Result Value Ref Range    Color, UA Colorless     Clarity, UA Clear     Specific Gravity, UA 1.007 1.003 - 1.030    pH, UA 8.5 5.0 - 9.0    Leukocyte Esterase, UA Negative Negative    Nitrite, UA Negative Negative    Protein, UA Negative Negative    Glucose, UA Negative Negative    Ketones, UA Negative Negative    Urobilinogen, UA <2.0 mg/dL <1.6 mg/dL    Bilirubin, UA Negative Negative    Blood, UA Negative Negative    RBC, UA <1 <=4 /HPF    WBC, UA 2 0 - 5 /HPF    Squam Epithel, UA <1 0 - 5 /HPF    Bacteria, UA None Seen None Seen /HPF        Imaging:   No results found.       _____________    Note - This record has been created using [employer]. Chart creation errors have been sought, but may not always have been located. Such creation errors do not reflect on the standard of medical care.

## 2022-02-18 LAB — BASIC METABOLIC PANEL
ANION GAP: 8 mmol/L (ref 5–14)
ANION GAP: 9 mmol/L (ref 5–14)
BLOOD UREA NITROGEN: 12 mg/dL (ref 9–23)
BLOOD UREA NITROGEN: 13 mg/dL (ref 9–23)
BUN / CREAT RATIO: 18
BUN / CREAT RATIO: 20
CALCIUM: 8.6 mg/dL — ABNORMAL LOW (ref 8.7–10.4)
CALCIUM: 9 mg/dL (ref 8.7–10.4)
CHLORIDE: 111 mmol/L — ABNORMAL HIGH (ref 98–107)
CHLORIDE: 113 mmol/L — ABNORMAL HIGH (ref 98–107)
CO2: 23 mmol/L (ref 20.0–31.0)
CO2: 23 mmol/L (ref 20.0–31.0)
CREATININE: 0.64 mg/dL
CREATININE: 0.65 mg/dL
EGFR CKD-EPI (2021) FEMALE: >90 mL/min/{1.73_m2} (ref >=60)
EGFR CKD-EPI (2021) FEMALE: >90 mL/min/{1.73_m2} (ref >=60)
GLUCOSE RANDOM: 224 mg/dL — ABNORMAL HIGH (ref 70–179)
GLUCOSE RANDOM: 236 mg/dL — ABNORMAL HIGH (ref 70–179)
POTASSIUM: 3.6 mmol/L (ref 3.4–4.8)
POTASSIUM: 3.6 mmol/L (ref 3.4–4.8)
SODIUM: 143 mmol/L (ref 135–145)
SODIUM: 144 mmol/L (ref 135–145)

## 2022-02-18 LAB — URINALYSIS WITH MICROSCOPY
BACTERIA: NONE SEEN /HPF
BILIRUBIN UA: NEGATIVE
BLOOD UA: NEGATIVE
GLUCOSE UA: NEGATIVE
KETONES UA: NEGATIVE
LEUKOCYTE ESTERASE UA: NEGATIVE
NITRITE UA: NEGATIVE
PH UA: 8 (ref 5.0–9.0)
RBC UA: <1 /HPF (ref <=4)
SPECIFIC GRAVITY UA: 1.009 (ref 1.003–1.030)
SQUAMOUS EPITHELIAL: <1 /HPF (ref 0–5)
UROBILINOGEN UA: <2
WBC UA: <1 /HPF (ref 0–5)

## 2022-02-18 LAB — HEMATOPATHOLOGY LEUKEMIA/LYMPHOMA FLOW CYTOMETRY, CSF
LYMPHS CSF: 44.7 %
MONO/MACROPHAGE CSF: 52.6 %
NEUTROPHILS, CSF: 2.6 %
NUCLEATED CELLS, CSF: 1 ul (ref 0–5)
NUMBER OF CELLS CSF: 38
RBC CSF: 2 ul — ABNORMAL HIGH

## 2022-02-18 LAB — CBC W/ AUTO DIFF
BASOPHILS ABSOLUTE COUNT: 0 10*9/L (ref 0.0–0.1)
BASOPHILS RELATIVE PERCENT: 0.1 %
EOSINOPHILS ABSOLUTE COUNT: 0 10*9/L (ref 0.0–0.5)
EOSINOPHILS RELATIVE PERCENT: 0 %
HEMATOCRIT: 24.6 % — ABNORMAL LOW (ref 34.0–44.0)
HEMOGLOBIN: 8.5 g/dL — ABNORMAL LOW (ref 11.3–14.9)
LYMPHOCYTES ABSOLUTE COUNT: 1.1 10*9/L (ref 1.1–3.6)
LYMPHOCYTES RELATIVE PERCENT: 17.5 %
MEAN CORPUSCULAR HEMOGLOBIN CONC: 34.6 g/dL (ref 32.0–36.0)
MEAN CORPUSCULAR HEMOGLOBIN: 33.7 pg — ABNORMAL HIGH (ref 25.9–32.4)
MEAN CORPUSCULAR VOLUME: 97.6 fL — ABNORMAL HIGH (ref 77.6–95.7)
MEAN PLATELET VOLUME: 7 fL (ref 6.8–10.7)
MONOCYTES ABSOLUTE COUNT: 0.2 10*9/L — ABNORMAL LOW (ref 0.3–0.8)
MONOCYTES RELATIVE PERCENT: 3.2 %
NEUTROPHILS ABSOLUTE COUNT: 4.9 10*9/L (ref 1.8–7.8)
NEUTROPHILS RELATIVE PERCENT: 79.2 %
PLATELET COUNT: 173 10*9/L (ref 150–450)
RED BLOOD CELL COUNT: 2.51 10*12/L — ABNORMAL LOW (ref 3.95–5.13)
RED CELL DISTRIBUTION WIDTH: 26.1 % — ABNORMAL HIGH (ref 12.2–15.2)
WBC ADJUSTED: 6.2 10*9/L (ref 3.6–11.2)

## 2022-02-18 LAB — METHOTREXATE LEVEL, PEDIATRIC HIGH DOSE: METHOTREXATE LEVEL: 36.07 umol/L

## 2022-02-18 MED ADMIN — methylPREDNISolone sodium succinate (PF) (SOLU-medrol) injection 50 mg: 50 mg | INTRAVENOUS | @ 04:00:00 | Stop: 2022-02-18

## 2022-02-18 MED ADMIN — cytarabine (PF) (ARA-C) 100 mg, hydrocortisone sod succ (Solu-CORTEF) 50 mg in sodium chloride (NS) 0.9 % 4 mL INTRATHECAL syringe: INTRATHECAL | @ 15:00:00 | Stop: 2022-02-18

## 2022-02-18 MED ADMIN — cetirizine (ZyrTEC) tablet 10 mg: 10 mg | ORAL | @ 13:00:00 | Stop: 2022-02-18

## 2022-02-18 MED ADMIN — acetaminophen (TYLENOL) tablet 650 mg: 650 mg | ORAL | @ 22:00:00

## 2022-02-18 MED ADMIN — dasatinib (SPRYCEL) tablet 70 mg: 70 mg | ORAL | @ 13:00:00

## 2022-02-18 MED ADMIN — prednisoLONE acetate (PRED FORTE) 1 % ophthalmic suspension 2 drop: 2 [drp] | OPHTHALMIC | @ 22:00:00

## 2022-02-18 MED ADMIN — ondansetron (ZOFRAN) tablet 24 mg: 24 mg | ORAL | @ 21:00:00 | Stop: 2022-02-20

## 2022-02-18 MED ADMIN — methylPREDNISolone sodium succinate (PF) (SOLU-medrol) injection 50 mg: 50 mg | INTRAVENOUS | @ 21:00:00 | Stop: 2022-02-18

## 2022-02-18 MED ADMIN — sodium bicarbonate 150 mEq in dextrose 5 % 1,150 mL continuous infusion: 150 meq | INTRAVENOUS | @ 21:00:00

## 2022-02-18 MED ADMIN — sodium bicarbonate 150 mEq in dextrose 5 % 1,150 mL continuous infusion: 150 meq | INTRAVENOUS | @ 13:00:00

## 2022-02-18 MED ADMIN — valACYclovir (VALTREX) tablet 500 mg: 500 mg | ORAL | @ 12:00:00

## 2022-02-18 MED ADMIN — methotrexate (Preservative Free) 1,200 mg in sodium chloride (NS) 0.9 % 1,000 mL IVPB: 1200 mg | INTRAVENOUS | @ 02:00:00

## 2022-02-18 MED ADMIN — sodium bicarbonate 150 mEq in dextrose 5 % 1,150 mL continuous infusion: 150 meq | INTRAVENOUS | @ 06:00:00

## 2022-02-18 NOTE — Unmapped (Signed)
VSS overnight, methotrexate and sodium bi-carb continues to infuse, tolerating well. Went off unit for CT of sinus, waiting results. Denies p/n/v/d. No other needs at this time.   Problem: Adult Inpatient Plan of Care  Goal: Plan of Care Review  Outcome: Ongoing - Unchanged  Goal: Patient-Specific Goal (Individualized)  Outcome: Ongoing - Unchanged  Goal: Absence of Hospital-Acquired Illness or Injury  Outcome: Ongoing - Unchanged  Intervention: Identify and Manage Fall Risk  Recent Flowsheet Documentation  Taken 02/17/2022 1905 by Earlean Shawl, RN  Safety Interventions:   bleeding precautions   fall reduction program maintained   infection management   lighting adjusted for tasks/safety   low bed   nonskid shoes/slippers when out of bed  Intervention: Prevent Skin Injury  Recent Flowsheet Documentation  Taken 02/17/2022 1905 by Earlean Shawl, RN  Skin Protection: adhesive use limited  Intervention: Prevent and Manage VTE (Venous Thromboembolism) Risk  Recent Flowsheet Documentation  Taken 02/17/2022 1905 by Earlean Shawl, RN  Activity Management: activity adjusted per tolerance  Intervention: Prevent Infection  Recent Flowsheet Documentation  Taken 02/17/2022 1905 by Earlean Shawl, RN  Infection Prevention: cohorting utilized  Goal: Optimal Comfort and Wellbeing  Outcome: Ongoing - Unchanged  Goal: Readiness for Transition of Care  Outcome: Ongoing - Unchanged  Goal: Rounds/Family Conference  Outcome: Ongoing - Unchanged

## 2022-02-18 NOTE — Unmapped (Cosign Needed)
CONSENT: The potential risks and benefits of the procedure were discussed and all questions were answered and written and verbal consent was obtained and documented by  Trula Ore MD and Christoper Fabian MD.      TIME OUT: A time out was performed to verify patient, procedure, and site.     During the patient time out, name and DOB patient identifiers are checked verbally (if possible) and also with the patient's wrist band prior to the procedure    PROCEDURE:     The patient was placed in a prone oblique position on the fluoroscopy table, and the lower back was prepped and draped in the usual sterile fashion.  Local anesthesia was provided at the selected puncture site using 1% lidocaine.  Using sterile technique and intermittent fluoroscopic guidance, a 20-gauge, 9 cm spinal needle was introduced into the thecal sac at L2-3 using a sublaminar approach.  There was spontaneous return of clear CSF.  Only 1 mL of CSF was collected due to slow flow and difficulty of patient tolerating procedure.  The specimen was sent to the lab.    Approximately 6 mL of cytarabine was instilled over 3-5 minutes by  Nakul Patel/Dedria Endres Juline Patch, MD.  The stylet was replaced, and the needle was removed.    A sterile bandage was placed at the puncture site.  The patient was instructed to lie supine for at least one hour.  The patient tolerated the procedure without immediate complications.

## 2022-02-18 NOTE — Unmapped (Cosign Needed)
Daily Progress Note      Principal Problem:    B-cell acute lymphoblastic leukemia (ALL) (CMS-HCC)       LOS: 2 days     Assessment/Plan:Assessment/Plan: 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     Today's plan: D2 C4B HyperCVAD=7/20. 24 Hr methotrexate level pending.  Frequent sinus pressure with drainage, no evidence of chronic or recent sinuses infections on CT or by ENT's examination.  Fluticasone and Neilmed nasal irrigation. Halls for symptomatic management of post nasal drip. Inpatient consult to neurology due to similarity of symptoms with atypical presentation of cluster headaches. Will see patient on Monday. IT Chemo in [place] today, follow for hempath.     Ph+ALL, in remission: Initially diagnosed in 09/2021 and underwent [id number] induction complicated by PICC line associated superficial thrombus. BMBx on 5/2 revealed morphologic CR. Admitted for Cycle 4B HyperCVAD + Dasatinib. Patient has completed #6 IT chemos prior to admission, with plan to do one per cycle due to intolerance of two/cycle.  - D1=7/20 Methotrexate, delayed due DDI with penicillin -   - Dasatinib 70 mg daily   - ppx Dapsone and Valtrex   [ ]  IT Cytarabine on 7/21 in [place], follow for hempath.     Regimen: R-HyperCVAD  Cycle # 4B  Primary Oncologist: Senaida Ores     Even Cycles  Date  7/20           Day 1 2 3 4 5    Methotrexate IV 1000mg /m2 x           Cytarabine 3g/m2 Q12H   X  x X  x       Methotrexate IT 12mg    x            EVEN CYCLE DETAILS  - OK to treat placed to be placed on 7/20  - bicarb continuous infusion until MTX level <0.05  - Leucovorin titration until MTX level <0.05   - daily UA to maintain pH >/= 7.0      Disposition EVEN:   - NO G-CSF on discharge due to leukocytosis and malaise   - OP Rituxan - scheduled for 7/24 - needs to be adjusted   - Three times weekly labs - needs to be requested   - Dr. Senaida Ores follow up on 8/10 - scheduled      Acute on chronic sinusitis: Started Augmentin 875-125mg  PO BID x 10 days. Day 1 02/09/22 . Symptoms improving since starting augmentin. Due to interaction between methotrexate and penicillins, we will stop Augmentin. She has completed 7 full days and 1 dose of the 8th day, which is sufficient treatment for sinusitis. Patient voiced concern of pneumonia developing due to a history of pneumonia after a 7 day course. After lengthy discussion of risk and benefits of delaying chemotherapy further, patient agreeable to stop Augmentin and begin chemotherapy. We will continue to monitor.   -CT sinus w/o due to HDMTX infusion, without evidence sinus infection  [ ]  Consult to ENT, appreicate assistance  [ ]  Neuro consult to see patient on Monday  - Fluticasone and NEILMED irrigation     Hemorrhoids: Symptoms significantly improved with OTC Dr. Silvana Newness ointment. Given improvement, patient would like to defer GI consult at this time.   - Continue OTC ointment      Grade 1 peripheral neuropathy: Paraesthesias present primarily in her finger pads, currently without medical interventions continue to monitor  while on therapy. Not affecting ADLs.     Anxiety: Reports taking Lexapro 10 mg at home  - No longer taking Lexapro     Immunocompromised status: Patient is immunocompromised secondary to disease and chemotherapy  -Antimicrobial prophylaxis as above     Impending Electrolyte Abnormality Secondary to Chemotherapy and/or IV Fluids  -Daily Electrolyte monitoring  -Replete per [facility] guidelines.      Impending Pancytopenia secondary to Acute Leukemia/chemotherapy:    - Transfuse 1 unit of pRBCs for hgb >7  - Transfuse 1 unit plt for plts >10K     Nutrition:                        Subjective:     Interval History: Patient reports feeling generally well, no acute complaints.  Sinus congestion and drainage per baseline. Slight worsening of cough.    Denies headache, changes in vision, sore throat, SOB, chest pain, abdominal pain, N/V/D/C, changes in urination, fevers, night sweats or chills.       10 point ROS otherwise negative except as above in the HPI.     Objective:     Vital signs in last 24 hours:  Temp:  [36.5 ??C (97.7 ??F)-37 ??C (98.6 ??F)] 37 ??C (98.6 ??F)  Heart Rate:  [87-107] 99  Resp:  [16-21] 21  BP: (102-120)/(61-80) 105/64  MAP (mmHg):  [72-91] 76  SpO2:  [92 %-96 %] 94 %    Intake/Output last 3 shifts:  I/O last 3 completed shifts:  In: 2855 [P.O.:1000; I.V.:1855]  Out: -     Meds:  Current Facility-Administered Medications   Medication Dose Route Frequency Provider Last Rate Last Admin    acetaminophen (TYLENOL) tablet 650 mg  650 mg Oral Q8H PRN Maryanna Shape, PA        [START ON 02/19/2022] cetirizine (ZyrTEC) tablet 10 mg  10 mg Oral Daily Herbert Moors, [place]        CHEMO CLARIFICATION ORDER   Other Continuous PRN [name], MD        CHEMO CLARIFICATION ORDER   Other Continuous PRN Herbert Moors, AGNP        cytarabine (PF) (ARA-C) 5,850 mg in sodium chloride (NS) 0.9 % 250 mL IVPB  3 g/m2 (Treatment Plan Recorded) Intravenous Q12H [name], MD        [START ON 02/19/2022] dapsone tablet 100 mg  100 mg Oral Daily Herbert Moors, AGNP        dasatinib [facility]) tablet 70 mg  70 mg Oral Daily [name], MD   70 mg at 02/18/22 0926    diphenhydrAMINE (BENADRYL) injection 25 mg  25 mg Intravenous Q4H PRN [name], MD        emollient combination no.92 (LUBRIDERM) lotion 1 application.  1 application. Topical Q1H PRN Maryanna Shape, PA        EPINEPHrine [facility]) injection 0.3 mg  0.3 mg Intramuscular Daily PRN [name], MD        famotidine (PF) (PEPCID) injection 20 mg  20 mg Intravenous Q4H PRN [name], MD        fluticasone propionate [facility]) 50 mcg/actuation nasal spray 2 spray  2 spray Each Nare BID Herbert Moors, AGNP        IP OKAY TO TREAT   Other Continuous PRN Herbert Moors, AGNP        [START ON 02/19/2022] leuCOVorin [facility])  tablet 25 mg  25 mg Oral Q6H [name], MD        Melene Muller ON 02/19/2022] leucovorin syringe  50 mg Intravenous Once [name], MD        levalbuterol [facility]) nebulizer solution 0.31 mg  1 ampule Nebulization Q4H PRN Maryanna Shape, PA        loperamide (IMODIUM) capsule 2 mg  2 mg Oral Q2H PRN Maryanna Shape, PA        loperamide (IMODIUM) capsule 4 mg  4 mg Oral Once PRN Maryanna Shape, PA        menthol (COUGH DROPS) lozenge 1 lozenge  1 lozenge Oral Q2H PRN Herbert Moors, AGNP        meperidine (DEMEROL) injection 25 mg  25 mg Intravenous Q30 Min PRN [name], MD        methylPREDNISolone sodium succinate (PF) (SOLU-medrol) injection 125 mg  125 mg Intravenous Q4H PRN [name], MD        methylPREDNISolone sodium succinate (PF) (SOLU-medrol) injection 50 mg  50 mg Intravenous Q12H [name], MD        ondansetron [facility]) tablet 24 mg  24 mg Oral Q24H [name], MD        prednisoLONE acetate (PRED FORTE) 1 % ophthalmic suspension 2 drop  2 drop Both Eyes QID [name], MD        prochlorperazine (COMPAZINE) injection 10 mg  10 mg Intravenous Q6H PRN [name], MD        prochlorperazine (COMPAZINE) tablet 10 mg  10 mg Oral Q6H PRN [name], MD        sod chlor-bicarb-squeez bottle (NEILMED) nasal packet 1 packet  1 packet Each Nare BID Herbert Moors, AGNP        sodium bicarbonate 150 mEq in dextrose 5 % 1,150 mL continuous infusion  150 mEq Intravenous Continuous [name], MD 150 mL/hr at 02/18/22 0926 150 mEq at 02/18/22 0926    sodium chloride (NS) 0.9 % flush 10 mL  10 mL Intravenous BID Maryanna Shape, PA        sodium chloride (NS) 0.9 % flush 10 mL  10 mL Intravenous BID Maryanna Shape, PA   10 mL at 02/16/22 2100    sodium chloride (NS) 0.9 % flush 10 mL  10 mL Intravenous BID Maryanna Shape, PA   10 mL at 02/17/22 7829    sodium chloride (NS) 0.9 % infusion  20 mL/hr Intravenous Continuous PRN [name], MD        sodium chloride (NS) 0.9 % infusion  20 mL/hr Intravenous Continuous Maryanna Shape, PA        sodium chloride 0.9% (NS) bolus 1,000 mL  1,000 mL Intravenous Daily PRN [name], MD        valACYclovir (VALTREX) tablet 500 mg  500 mg Oral Daily Maryanna Shape, PA   500 mg at 02/18/22 0827       Physical Exam:  General: Resting, in no apparent distress, lying in bed  HEENT:  PERRL. No scleral icterus or conjunctival injection. MMM without ulceration, erythema or exudate. No cervical or axillary lymphadenopathy.   Heart:  RRR. S1, S2. No murmurs, gallops, or rubs.  Lungs:  Breathing is unlabored, and patient is speaking full sentences with ease.  No stridor.  CTAB. No rales, ronchi, or crackles.    Abdomen:  No distention or pain on palpation.  Bowel sounds are present and normoactive x 4.  No palpable hepatomegaly or splenomegaly.  No palpable masses.  Skin:  No rashes, petechiae or purpura.  No areas of skin breakdown. Warm to touch, dry, smooth, and even.  Musculoskeletal:  No grossly-evident joint effusions or deformities.  Range of motion about the shoulder, elbow, hips and knees is grossly normal.  Psychiatric:  Range of affect is appropriate.    Neurologic:  Alert and oriented to person, place, time and situation.  Gait is normal.  No Nystagmus Cerebellar tasks (finger-to-nose, rapid hand movement, heel along shin) are completed with ease and are symmetric. CNII-CNXII grossly intact.  Extremities:  Appear well-perfused. No clubbing, edema, or cyanosis.  CVAD: R CW Port - no erythema, nontender; dressing CDI.      Labs:  Recent Labs     02/16/22  1501 02/17/22  0218 02/18/22  0354   WBC 11.6* 5.7 6.2   NEUTROABS 5.2 3.2 4.9   LYMPHSABS 4.9* 1.6 1.1   HGB 9.2* 8.6* 8.5*   HCT 26.6* 24.4* 24.6*   PLT 121* 154 173   CREATININE 0.54* 0.66 0.64 - 0.65   BUN 12 15 13  - 12   BILITOT 0.7 0.7  --    BILIDIR  -- 0.20  --    AST 67* 59*  --    ALT 137* 117*  --    ALKPHOS 94 90  --    K 3.8 3.8 3.6 - 3.6   CALCIUM 8.8 8.6* 9.0 - 8.6*   NA 142 143 143 - 144   CL 108* 108* 111* - 113*   CO2 29.0 27.0 23.0 - 23.0         Imaging:  CT sinus Complete.    Herbert Moors, [place]  02/18/2022  4:23 PM   Nurse Practitioner  Hematology/Oncology Department   [facility]   Group pager: [phone]

## 2022-02-19 LAB — BASIC METABOLIC PANEL
ANION GAP: 5 mmol/L (ref 5–14)
BLOOD UREA NITROGEN: 13 mg/dL (ref 9–23)
BUN / CREAT RATIO: 22
CALCIUM: 8.3 mg/dL — ABNORMAL LOW (ref 8.7–10.4)
CHLORIDE: 109 mmol/L — ABNORMAL HIGH (ref 98–107)
CO2: 30 mmol/L (ref 20.0–31.0)
CREATININE: 0.59 mg/dL — ABNORMAL LOW
EGFR CKD-EPI (2021) FEMALE: >90 mL/min/{1.73_m2} (ref >=60)
GLUCOSE RANDOM: 129 mg/dL (ref 70–179)
POTASSIUM: 3.4 mmol/L (ref 3.4–4.8)
SODIUM: 144 mmol/L (ref 135–145)

## 2022-02-19 LAB — CBC W/ AUTO DIFF
BASOPHILS ABSOLUTE COUNT: 0 10*9/L (ref 0.0–0.1)
BASOPHILS RELATIVE PERCENT: 0 %
EOSINOPHILS ABSOLUTE COUNT: 0 10*9/L (ref 0.0–0.5)
EOSINOPHILS RELATIVE PERCENT: 0 %
HEMATOCRIT: 23 % — ABNORMAL LOW (ref 34.0–44.0)
HEMOGLOBIN: 8 g/dL — ABNORMAL LOW (ref 11.3–14.9)
LYMPHOCYTES ABSOLUTE COUNT: 0.6 10*9/L — ABNORMAL LOW (ref 1.1–3.6)
LYMPHOCYTES RELATIVE PERCENT: 9.3 %
MEAN CORPUSCULAR HEMOGLOBIN CONC: 34.7 g/dL (ref 32.0–36.0)
MEAN CORPUSCULAR HEMOGLOBIN: 33.4 pg — ABNORMAL HIGH (ref 25.9–32.4)
MEAN CORPUSCULAR VOLUME: 96.2 fL — ABNORMAL HIGH (ref 77.6–95.7)
MEAN PLATELET VOLUME: 6.6 fL — ABNORMAL LOW (ref 6.8–10.7)
MONOCYTES ABSOLUTE COUNT: 0.3 10*9/L (ref 0.3–0.8)
MONOCYTES RELATIVE PERCENT: 5.5 %
NEUTROPHILS ABSOLUTE COUNT: 5.3 10*9/L (ref 1.8–7.8)
NEUTROPHILS RELATIVE PERCENT: 85.2 %
PLATELET COUNT: 184 10*9/L (ref 150–450)
RED BLOOD CELL COUNT: 2.39 10*12/L — ABNORMAL LOW (ref 3.95–5.13)
RED CELL DISTRIBUTION WIDTH: 26 % — ABNORMAL HIGH (ref 12.2–15.2)
WBC ADJUSTED: 6.2 10*9/L (ref 3.6–11.2)

## 2022-02-19 LAB — URINALYSIS WITH MICROSCOPY
BACTERIA: NONE SEEN /HPF
BILIRUBIN UA: NEGATIVE
BLOOD UA: NEGATIVE
GLUCOSE UA: NEGATIVE
KETONES UA: NEGATIVE
LEUKOCYTE ESTERASE UA: NEGATIVE
NITRITE UA: NEGATIVE
PH UA: 8 (ref 5.0–9.0)
PROTEIN UA: NEGATIVE
RBC UA: <1 /HPF (ref <=4)
SPECIFIC GRAVITY UA: 1.01 (ref 1.003–1.030)
SQUAMOUS EPITHELIAL: <1 /HPF (ref 0–5)
TRANSITIONAL EPITHELIAL: <1 /HPF (ref 0–2)
UROBILINOGEN UA: <2
WBC UA: 1 /HPF (ref 0–5)

## 2022-02-19 LAB — METHOTREXATE LEVEL: METHOTREXATE LEVEL: 0.51 umol/L

## 2022-02-19 MED ADMIN — leuCOVorin (WELLCOVORIN) tablet 25 mg: 25 mg | ORAL | @ 15:00:00

## 2022-02-19 MED ADMIN — leucovorin syringe: 50 mg | INTRAVENOUS | @ 08:00:00 | Stop: 2022-02-19

## 2022-02-19 MED ADMIN — dasatinib (SPRYCEL) tablet 70 mg: 70 mg | ORAL | @ 13:00:00

## 2022-02-19 MED ADMIN — fluticasone propionate (FLONASE) 50 mcg/actuation nasal spray 2 spray: 2 | NASAL

## 2022-02-19 MED ADMIN — sodium chloride (NS) 0.9 % flush 10 mL: 10 mL | INTRAVENOUS

## 2022-02-19 MED ADMIN — sodium chloride (NS) 0.9 % flush 10 mL: 10 mL | INTRAVENOUS | @ 13:00:00

## 2022-02-19 MED ADMIN — fluticasone propionate (FLONASE) 50 mcg/actuation nasal spray 2 spray: 2 | NASAL | @ 13:00:00

## 2022-02-19 MED ADMIN — prednisoLONE acetate (PRED FORTE) 1 % ophthalmic suspension 2 drop: 2 [drp] | OPHTHALMIC

## 2022-02-19 MED ADMIN — ondansetron (ZOFRAN) tablet 24 mg: 24 mg | ORAL | @ 18:00:00 | Stop: 2022-02-22

## 2022-02-19 MED ADMIN — methylPREDNISolone sodium succinate (PF) (SOLU-medrol) injection 50 mg: 50 mg | INTRAVENOUS | @ 18:00:00 | Stop: 2022-02-21

## 2022-02-19 MED ADMIN — sodium bicarbonate 150 mEq in dextrose 5 % 1,150 mL continuous infusion: 150 meq | INTRAVENOUS | @ 13:00:00

## 2022-02-19 MED ADMIN — leuCOVorin (WELLCOVORIN) tablet 25 mg: 25 mg | ORAL | @ 19:00:00

## 2022-02-19 MED ADMIN — prednisoLONE acetate (PRED FORTE) 1 % ophthalmic suspension 2 drop: 2 [drp] | OPHTHALMIC | @ 10:00:00

## 2022-02-19 MED ADMIN — sodium chloride (OCEAN) 0.65 % nasal spray 2 spray: 2 | NASAL | @ 18:00:00

## 2022-02-19 MED ADMIN — sodium chloride (OCEAN) 0.65 % nasal spray 2 spray: 2 | NASAL | @ 13:00:00

## 2022-02-19 MED ADMIN — prednisoLONE acetate (PRED FORTE) 1 % ophthalmic suspension 2 drop: 2 [drp] | OPHTHALMIC | @ 21:00:00

## 2022-02-19 MED ADMIN — furosemide (LASIX) injection 20 mg: 20 mg | INTRAVENOUS | @ 10:00:00 | Stop: 2022-02-19

## 2022-02-19 MED ADMIN — cytarabine (PF) (ARA-C) 5,850 mg in sodium chloride (NS) 0.9 % 250 mL IVPB: 3 g/m2 | INTRAVENOUS | @ 19:00:00 | Stop: 2022-02-21

## 2022-02-19 MED ADMIN — sodium bicarbonate 150 mEq in dextrose 5 % 1,150 mL continuous infusion: 150 meq | INTRAVENOUS | @ 19:00:00

## 2022-02-19 MED ADMIN — cetirizine (ZyrTEC) tablet 10 mg: 10 mg | ORAL | @ 13:00:00

## 2022-02-19 MED ADMIN — valACYclovir (VALTREX) tablet 500 mg: 500 mg | ORAL | @ 13:00:00

## 2022-02-19 MED ADMIN — prednisoLONE acetate (PRED FORTE) 1 % ophthalmic suspension 2 drop: 2 [drp] | OPHTHALMIC | @ 15:00:00

## 2022-02-19 MED ADMIN — sodium bicarbonate 150 mEq in dextrose 5 % 1,150 mL continuous infusion: 150 meq | INTRAVENOUS | @ 05:00:00

## 2022-02-19 NOTE — Unmapped (Signed)
High-Dose Methotrexate Therapeutic Monitoring Pharmacy Note    Patricia [name] is a 46 y.o. 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    Dosing Information:  Methotrexate Dose: 1 g/m2   Dose reduced? No  Date/Time of Initiation: 02/17/22 at 1406    Goals:  Methotrexate Levels  Monitor methotrexate levels with leucovorin dose adjusted per HyperCVAD protocol    Additional Clinical Monitoring/Outcomes  Maintain urine pH >/= 7 with serum CO2 </= 40  Monitor renal function (SCr and urine output)  Monitor for signs/symptoms of adverse events (e.g., myelosuppression, diarrhea, mucositis, neurotoxicity)    Results:   Methotrexate Levels    Level (micromolar) Hours   Date Time   36.07 26 02/18/22 1608   0.51 40 02/19/22 0622     Lab Results   Component Value Date    CREATININE 0.59 (L) 02/19/2022     Serum CO2: 30 mmol/L  Urine pH: 8.0    Pharmacokinetic Considerations and Significant Drug Interactions:  Concurrent nephrotoxic medications: Not applicable  Medications that delay methotrexate clearance: None identified    Assessment/Plan:  Urinary Alkalinization  Current urine pH is at goal (>/= 7); recommend to continue current urinary alkalinization therapy    Methotrexate Level Monitoring and Leucovorin Dosing  Methotrexate level within range according to HyperCVAD protocol. A 24-hour note wasn't written, but at 24 hours on 7/21, the methotrexate was not at goal (> 20 micromolar) and cytarabine was held. Labs were drawn the next morning on 7/22 and methotrexate was within range so cytarabine was reordered in the treatment plan and initiated that afternoon.  Recommend to continue current leucovorin dose of 50 mg x 1, then 25 mg PO Q6H to begin  12 hours after completion of methotrexate infusion    Cytarabine Dose Adjustment  Recommend to continue cytarabine at a dose of 3 g/m2    Follow-up  Continue to obtain a methotrexate level with morning labs until methotrexate level is < 0.05 micromolar  Please avoid using proton pump inhibitors, phenytoin, folic acid, trimethoprim/sulfamethoxazole, NSAIDs, aminoglycosides, and penicillins. Use ACE inhibitors, ARBs, fluoroquinolones and other beta lactam antibiotics with caution.   A pharmacist will continue to monitor and recommend supportive care as appropriate    Please page service pharmacist with questions/clarifications.    Mat Carne, PharmD

## 2022-02-19 NOTE — Unmapped (Signed)
OTO Lasix administered as ordered.     Problem: Adult Inpatient Plan of Care  Goal: Plan of Care Review  Outcome: Ongoing - Unchanged  Goal: Patient-Specific Goal (Individualized)  Outcome: Ongoing - Unchanged  Goal: Absence of Hospital-Acquired Illness or Injury  Outcome: Ongoing - Unchanged  Intervention: Identify and Manage Fall Risk  Recent Flowsheet Documentation  Taken 02/18/2022 2354 by Eusebio Me, RN  Safety Interventions:   bleeding precautions   chemotherapeutic agent precautions   environmental modification   infection management   isolation precautions   lighting adjusted for tasks/safety   low bed   neutropenic precautions   nonskid shoes/slippers when out of bed  Intervention: Prevent and Manage VTE (Venous Thromboembolism) Risk  Recent Flowsheet Documentation  Taken 02/19/2022 0042 by Eusebio Me, RN  VTE Prevention/Management:   fluids promoted   intravenous hydration  Taken 02/18/2022 2354 by Eusebio Me, RN  Activity Management: activity adjusted per tolerance  Range of Motion: Bilateral Upper and Lower Extremities  Intervention: Prevent Infection  Recent Flowsheet Documentation  Taken 02/18/2022 2354 by Eusebio Me, RN  Infection Prevention:   cohorting utilized   environmental surveillance performed   equipment surfaces disinfected   hand hygiene promoted   personal protective equipment utilized   rest/sleep promoted   single patient room provided   visitors restricted/screened  Goal: Optimal Comfort and Wellbeing  Outcome: Ongoing - Unchanged  Goal: Readiness for Transition of Care  Outcome: Ongoing - Unchanged  Goal: Rounds/Family Conference  Outcome: Ongoing - Unchanged

## 2022-02-19 NOTE — Unmapped (Signed)
Patient AOx4, VSS, afebrile. Patient down for IT Chemo this shift.  Patient received 1 PRN dose of Tylenol for headache with good relief.  Scheduled Cytarabine rescheduled by pharmacy due to Methotrexate level not back yet.  Patient aware.  No falls or acute events this shift. Plan of Care reviewed with patient.  Safety measures in place of bed low with brakes locked, non-skid footwear on while out of bed, call bell within reach.        Problem: Adult Inpatient Plan of Care  Goal: Plan of Care Review  Outcome: Ongoing - Unchanged  Goal: Patient-Specific Goal (Individualized)  Outcome: Ongoing - Unchanged  Goal: Absence of Hospital-Acquired Illness or Injury  Outcome: Ongoing - Unchanged  Intervention: Identify and Manage Fall Risk  Recent Flowsheet Documentation  Taken 02/18/2022 0713 by [place] Constable, RN  Safety Interventions:   bleeding precautions   chemotherapeutic agent precautions   environmental modification   fall reduction program maintained   infection management   isolation precautions   lighting adjusted for tasks/safety   neutropenic precautions   nonskid shoes/slippers when out of bed  Intervention: Prevent and Manage VTE (Venous Thromboembolism) Risk  Recent Flowsheet Documentation  Taken 02/18/2022 0713 by [place] Constable, RN  Activity Management: activity adjusted per tolerance  Intervention: Prevent Infection  Recent Flowsheet Documentation  Taken 02/18/2022 1610 by [place] Constable, RN  Infection Prevention:   cohorting utilized   environmental surveillance performed   equipment surfaces disinfected   hand hygiene promoted  Goal: Optimal Comfort and Wellbeing  Outcome: Ongoing - Unchanged  Goal: Readiness for Transition of Care  Outcome: Ongoing - Unchanged  Goal: Rounds/Family Conference  Outcome: Ongoing - Unchanged

## 2022-02-19 NOTE — Unmapped (Signed)
Daily Progress Note      Principal Problem:    B-cell acute lymphoblastic leukemia (ALL) (CMS-HCC)       LOS: 3 days     Assessment/Plan:Assessment/Plan: 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     Today's plan: D2 C4B HyperCVAD=7/20. 24 Hr methotrexate level .51.  Frequent sinus pressure with drainage, no evidence of chronic or recent sinuses infections on CT or by ENT's examination.  Fluticasone and Neilmed nasal irrigation. Halls for symptomatic management of post nasal drip. Inpatient consult to neurology due to similarity of symptoms with atypical presentation of cluster headaches or migraines with family history of migraines in mother. Will see patient on Monday. IT Chemo in [facility] 7/21, follow for hempath.     Ph+ALL, in remission: Initially diagnosed in 09/2021 and underwent [id number] induction complicated by PICC line associated superficial thrombus. BMBx on 5/2 revealed morphologic CR. Admitted for Cycle 4B HyperCVAD + Dasatinib. Patient has completed #6 IT chemos prior to admission, with plan to do one per cycle due to intolerance of two/cycle.  - D1=7/20 Methotrexate, delayed due DDI with penicillin -   - Dasatinib 70 mg daily   - ppx Dapsone and Valtrex   [ ]  IT Cytarabine on 7/21 in [place], following hempath.  Paucicellular specimen with no blasts identified and too few cells for flow cytometry     Regimen: R-HyperCVAD  Cycle # 4B  Primary Oncologist: Senaida Ores     Even Cycles  Date  7/20           Day 1 2 3 4 5    Methotrexate IV 1000mg /m2 x           Cytarabine 3g/m2 Q12H   X  x X  x       Methotrexate IT 12mg    x            EVEN CYCLE DETAILS  - OK to treat placed to be placed on 7/20  - bicarb continuous infusion until MTX level <0.05  - Leucovorin titration until MTX level <0.05   - daily UA to maintain pH >/= 7.0      Disposition EVEN:   - NO G-CSF on discharge due to leukocytosis and malaise   - OP Rituxan - scheduled for 7/24 - needs to be adjusted   - Three times weekly labs - needs to be requested   - Dr. Senaida Ores follow up on 8/10 - scheduled      Acute on chronic sinusitis: Started Augmentin 875-125mg  PO BID x 10 days. Day 1 02/09/22 . Symptoms improving since starting augmentin. Due to interaction between methotrexate and penicillins, we will stop Augmentin. She has completed 7 full days and 1 dose of the 8th day, which is sufficient treatment for sinusitis. Patient voiced concern of pneumonia developing due to a history of pneumonia after a 7 day course. After lengthy discussion of risk and benefits of delaying chemotherapy further, patient agreeable to stop Augmentin and begin chemotherapy. We will continue to monitor.   -CT sinus w/o due to HDMTX infusion, without evidence sinus infection  [ ]  Consult to ENT, appreicate assistance  [ ]  Neuro consult to see patient on Monday  - Fluticasone and NEILMED irrigation     Hemorrhoids: Symptoms significantly improved with OTC Dr. Silvana Newness ointment. Given improvement, patient would like to defer GI consult at this time.   - Continue OTC ointment  Grade 1 peripheral neuropathy: Paraesthesias present primarily in her finger pads, currently without medical interventions continue to monitor while on therapy. Not affecting ADLs.     Anxiety: Reports taking Lexapro 10 mg at home  - No longer taking Lexapro     Immunocompromised status: Patient is immunocompromised secondary to disease and chemotherapy  -Antimicrobial prophylaxis as above     Impending Electrolyte Abnormality Secondary to Chemotherapy and/or IV Fluids  -Daily Electrolyte monitoring  -Replete per [facility] guidelines.      Impending Pancytopenia secondary to Acute Leukemia/chemotherapy:    - Transfuse 1 unit of pRBCs for hgb >7  - Transfuse 1 unit plt for plts >10K     Nutrition:                        Subjective:     Interval History: Patient reports feeling generally well, no acute complaints.  Sinus congestion and drainage per baseline. She has researched migraines and cluster headaches since yesterday and is more suspicious that she now has migraines.    Denies headache, changes in vision, sore throat, SOB, chest pain, abdominal pain, N/V/D/C, changes in urination, fevers, night sweats or chills.       10 point ROS otherwise negative except as above in the HPI.     Objective:     Vital signs in last 24 hours:  Temp:  [36.5 ??C (97.7 ??F)-37.2 ??C (98.9 ??F)] 36.5 ??C (97.7 ??F)  Heart Rate:  [81-107] 81  Resp:  [16-21] 16  BP: (105-127)/(64-84) 117/77  MAP (mmHg):  [76-96] 89  SpO2:  [92 %-98 %] 96 %    Intake/Output last 3 shifts:  I/O last 3 completed shifts:  In: 6138 [P.O.:1260; I.V.:4878]  Out: 2470 [Urine:2470]    Meds:  Current Facility-Administered Medications   Medication Dose Route Frequency Provider Last Rate Last Admin    acetaminophen (TYLENOL) tablet 650 mg  650 mg Oral Q8H PRN Maryanna Shape, PA   650 mg at 02/18/22 1735    cetirizine (ZyrTEC) tablet 10 mg  10 mg Oral Daily Herbert Moors, [place]        CHEMO CLARIFICATION ORDER   Other Continuous PRN [name], MD        CHEMO CLARIFICATION ORDER   Other Continuous PRN Herbert Moors, AGNP        dapsone tablet 100 mg  100 mg Oral Daily Herbert Moors, AGNP        dasatinib [facility]) tablet 70 mg  70 mg Oral Daily [name], MD   70 mg at 02/18/22 1610    diphenhydrAMINE (BENADRYL) injection 25 mg  25 mg Intravenous Q4H PRN [name], MD        emollient combination no.92 (LUBRIDERM) lotion 1 application.  1 application. Topical Q1H PRN Maryanna Shape, PA        EPINEPHrine [facility]) injection 0.3 mg  0.3 mg Intramuscular Daily PRN [name], MD        famotidine (PF) (PEPCID) injection 20 mg  20 mg Intravenous Q4H PRN [name], MD        fluticasone propionate [facility]) 50 mcg/actuation nasal spray 2 spray  2 spray Each Nare BID Herbert Moors, AGNP   2 spray at 02/18/22 2012    IP OKAY TO TREAT   Other Continuous PRN Herbert Moors, AGNP        leuCOVorin [facility]) tablet 25 mg  25 mg Oral Q6H [name], MD        levalbuterol [facility]) nebulizer solution 0.31 mg  1 ampule Nebulization Q4H PRN Maryanna Shape, PA        loperamide (IMODIUM) capsule 2 mg  2 mg Oral Q2H PRN Maryanna Shape, PA        loperamide (IMODIUM) capsule 4 mg  4 mg Oral Once PRN Maryanna Shape, PA        menthol (COUGH DROPS) lozenge 1 lozenge  1 lozenge Oral Q2H PRN Herbert Moors, AGNP        meperidine (DEMEROL) injection 25 mg  25 mg Intravenous Q30 Min PRN [name], MD        methylPREDNISolone sodium succinate (PF) (SOLU-medrol) injection 125 mg  125 mg Intravenous Q4H PRN [name], MD        ondansetron [facility]) tablet 24 mg  24 mg Oral Q24H [name], MD   24 mg at 02/18/22 1727    prednisoLONE acetate (PRED FORTE) 1 % ophthalmic suspension 2 drop  2 drop Both Eyes QID [name], MD   2 drop at 02/19/22 0981    prochlorperazine (COMPAZINE) injection 10 mg  10 mg Intravenous Q6H PRN [name], MD        prochlorperazine (COMPAZINE) tablet 10 mg  10 mg Oral Q6H PRN [name], MD        sodium bicarbonate 150 mEq in dextrose 5 % 1,150 mL continuous infusion  150 mEq Intravenous Continuous [name], MD 150 mL/hr at 02/19/22 0042 150 mEq at 02/19/22 0042    sodium chloride (NS) 0.9 % flush 10 mL  10 mL Intravenous BID Maryanna Shape, PA   10 mL at 02/18/22 2018    sodium chloride (NS) 0.9 % flush 10 mL  10 mL Intravenous BID Maryanna Shape, PA   10 mL at 02/16/22 2100    sodium chloride (NS) 0.9 % flush 10 mL  10 mL Intravenous BID Maryanna Shape, PA   10 mL at 02/18/22 2017    sodium chloride (NS) 0.9 % infusion  20 mL/hr Intravenous Continuous PRN [name], MD        sodium chloride (NS) 0.9 % infusion  20 mL/hr Intravenous Continuous Maryanna Shape, PA        sodium chloride (OCEAN) 0.65 % nasal spray 2 spray  2 spray Each Nare TID Herbert Moors, AGNP        sodium chloride 0.9% (NS) bolus 1,000 mL  1,000 mL Intravenous Daily PRN [name], MD        valACYclovir (VALTREX) tablet 500 mg  500 mg Oral Daily Maryanna Shape, PA   500 mg at 02/18/22 0827       Physical Exam:  General: Resting, in no apparent distress, lying in bed  HEENT:  PERRL. No scleral icterus or conjunctival injection. MMM without ulceration, erythema or exudate. No cervical or axillary lymphadenopathy.   Heart:  RRR. S1, S2. No murmurs, gallops, or rubs.  Lungs:  Breathing is unlabored, and patient is speaking full sentences with ease.  No stridor.  CTAB. No rales, ronchi, or crackles.    Abdomen:  No distention or pain on palpation.  Bowel sounds are present and normoactive x 4.  No palpable hepatomegaly or splenomegaly.  No palpable masses.  Skin:  No rashes, petechiae or purpura.  No areas of skin  breakdown. Warm to touch, dry, smooth, and even.  Musculoskeletal:  No grossly-evident joint effusions or deformities.  Range of motion about the shoulder, elbow, hips and knees is grossly normal.  Psychiatric:  Range of affect is appropriate.    Neurologic:  Alert and oriented to person, place, time and situation.  Gait is normal.  No Nystagmus Cerebellar tasks (finger-to-nose, rapid hand movement, heel along shin) are completed with ease and are symmetric. CNII-CNXII grossly intact.  Extremities:  Appear well-perfused. No clubbing, edema, or cyanosis.  CVAD: R CW Port - no erythema, nontender; dressing CDI.      Labs:  Recent Labs     02/16/22  1501 02/17/22  0218 02/18/22  0354 02/19/22  0622   WBC 11.6* 5.7 6.2 6.2   NEUTROABS 5.2 3.2 4.9 5.3   LYMPHSABS 4.9* 1.6 1.1 0.6*   HGB 9.2* 8.6* 8.5* 8.0*   HCT 26.6* 24.4* 24.6* 23.0*   PLT 121* 154 173 184   CREATININE 0.54* 0.66 0.64 - 0.65  --    BUN 12 15 13  - 12  --    BILITOT 0.7 0.7  --   --    BILIDIR  --  0.20  --   --    AST 67* 59*  --   -- ALT 137* 117*  --   --    ALKPHOS 94 90  --   --    K 3.8 3.8 3.6 - 3.6  --    CALCIUM 8.8 8.6* 9.0 - 8.6*  --    NA 142 143 143 - 144  --    CL 108* 108* 111* - 113*  --    CO2 29.0 27.0 23.0 - 23.0  --          Imaging:  CT sinus 7/20  Frontal sinuses are clear. Frontal recesses are patent. Ethmoid air cells are clear. Minimal mucosal thickening of the right sphenoid sinus (5:56). Left sphenoid sinus is clear. Sphenoethmoidal recesses are patent. Minimal mucosal thickening of the right maxillary sinus (4:40). Left maxillary sinus is clear. Ostiomeatal units are patent.     Philippa Sicks, MD PhD  02/19/2022  7:06 AM   Hematology/Oncology HO IV  [facility]   Group pager: [phone]

## 2022-02-20 LAB — BASIC METABOLIC PANEL
ANION GAP: 6 mmol/L (ref 5–14)
BLOOD UREA NITROGEN: 16 mg/dL (ref 9–23)
BUN / CREAT RATIO: 27
CALCIUM: 8.3 mg/dL — ABNORMAL LOW (ref 8.7–10.4)
CHLORIDE: 104 mmol/L (ref 98–107)
CO2: 34 mmol/L — ABNORMAL HIGH (ref 20.0–31.0)
CREATININE: 0.6 mg/dL
EGFR CKD-EPI (2021) FEMALE: >90 mL/min/{1.73_m2} (ref >=60)
GLUCOSE RANDOM: 126 mg/dL (ref 70–179)
POTASSIUM: 3.4 mmol/L (ref 3.4–4.8)
SODIUM: 144 mmol/L (ref 135–145)

## 2022-02-20 LAB — HEPATIC FUNCTION PANEL
ALBUMIN: 3.4 g/dL (ref 3.4–5.0)
ALKALINE PHOSPHATASE: 70 U/L (ref 46–116)
ALT (SGPT): 91 U/L — ABNORMAL HIGH (ref 10–49)
AST (SGOT): 39 U/L — ABNORMAL HIGH (ref <=34)
BILIRUBIN DIRECT: 0.3 mg/dL (ref 0.00–0.30)
BILIRUBIN TOTAL: 1 mg/dL (ref 0.3–1.2)
PROTEIN TOTAL: 5.5 g/dL — ABNORMAL LOW (ref 5.7–8.2)

## 2022-02-20 LAB — CBC W/ AUTO DIFF
BASOPHILS ABSOLUTE COUNT: 0 10*9/L (ref 0.0–0.1)
BASOPHILS RELATIVE PERCENT: 0 %
EOSINOPHILS ABSOLUTE COUNT: 0 10*9/L (ref 0.0–0.5)
EOSINOPHILS RELATIVE PERCENT: 0 %
HEMATOCRIT: 22.2 % — ABNORMAL LOW (ref 34.0–44.0)
HEMOGLOBIN: 7.8 g/dL — ABNORMAL LOW (ref 11.3–14.9)
LYMPHOCYTES ABSOLUTE COUNT: 0.2 10*9/L — ABNORMAL LOW (ref 1.1–3.6)
LYMPHOCYTES RELATIVE PERCENT: 2.5 %
MEAN CORPUSCULAR HEMOGLOBIN CONC: 35.1 g/dL (ref 32.0–36.0)
MEAN CORPUSCULAR HEMOGLOBIN: 33.4 pg — ABNORMAL HIGH (ref 25.9–32.4)
MEAN CORPUSCULAR VOLUME: 95.1 fL (ref 77.6–95.7)
MEAN PLATELET VOLUME: 6.6 fL — ABNORMAL LOW (ref 6.8–10.7)
MONOCYTES ABSOLUTE COUNT: 0.1 10*9/L — ABNORMAL LOW (ref 0.3–0.8)
MONOCYTES RELATIVE PERCENT: 0.6 %
NEUTROPHILS ABSOLUTE COUNT: 8 10*9/L — ABNORMAL HIGH (ref 1.8–7.8)
NEUTROPHILS RELATIVE PERCENT: 96.9 %
PLATELET COUNT: 205 10*9/L (ref 150–450)
RED BLOOD CELL COUNT: 2.33 10*12/L — ABNORMAL LOW (ref 3.95–5.13)
RED CELL DISTRIBUTION WIDTH: 25.1 % — ABNORMAL HIGH (ref 12.2–15.2)
WBC ADJUSTED: 8.2 10*9/L (ref 3.6–11.2)

## 2022-02-20 LAB — URINALYSIS WITH MICROSCOPY
BACTERIA: NONE SEEN /HPF
BILIRUBIN UA: NEGATIVE
BLOOD UA: NEGATIVE
GLUCOSE UA: NEGATIVE
KETONES UA: NEGATIVE
LEUKOCYTE ESTERASE UA: NEGATIVE
NITRITE UA: NEGATIVE
PH UA: 8.5 (ref 5.0–9.0)
PROTEIN UA: NEGATIVE
RBC UA: <1 /HPF (ref <=4)
SPECIFIC GRAVITY UA: 1.011 (ref 1.003–1.030)
SQUAMOUS EPITHELIAL: <1 /HPF (ref 0–5)
UROBILINOGEN UA: <2
WBC UA: 1 /HPF (ref 0–5)

## 2022-02-20 LAB — COMPREHENSIVE METABOLIC PANEL
ALBUMIN: 3.4 g/dL (ref 3.4–5.0)
ALKALINE PHOSPHATASE: 70 U/L (ref 46–116)
ALT (SGPT): 91 U/L — ABNORMAL HIGH (ref 10–49)
ANION GAP: 6 mmol/L (ref 5–14)
AST (SGOT): 39 U/L — ABNORMAL HIGH (ref <=34)
BILIRUBIN TOTAL: 1 mg/dL (ref 0.3–1.2)
BLOOD UREA NITROGEN: 16 mg/dL (ref 9–23)
BUN / CREAT RATIO: 27
CALCIUM: 8.3 mg/dL — ABNORMAL LOW (ref 8.7–10.4)
CHLORIDE: 104 mmol/L (ref 98–107)
CO2: 34 mmol/L — ABNORMAL HIGH (ref 20.0–31.0)
CREATININE: 0.6 mg/dL
EGFR CKD-EPI (2021) FEMALE: >90 mL/min/{1.73_m2} (ref >=60)
GLUCOSE RANDOM: 126 mg/dL (ref 70–179)
POTASSIUM: 3.4 mmol/L (ref 3.4–4.8)
PROTEIN TOTAL: 5.5 g/dL — ABNORMAL LOW (ref 5.7–8.2)
SODIUM: 144 mmol/L (ref 135–145)

## 2022-02-20 LAB — METHOTREXATE LEVEL: METHOTREXATE LEVEL: 0.04 umol/L

## 2022-02-20 MED ORDER — PREDNISOLONE ACETATE 1 % EYE DROPS,SUSPENSION
Freq: Four times a day (QID) | OPHTHALMIC | 0 refills | 13 days
Start: 2022-02-20 — End: 2022-02-27

## 2022-02-20 MED ADMIN — acetaminophen (TYLENOL) tablet 650 mg: 650 mg | ORAL | @ 13:00:00

## 2022-02-20 MED ADMIN — prednisoLONE acetate (PRED FORTE) 1 % ophthalmic suspension 2 drop: 2 [drp] | OPHTHALMIC | @ 16:00:00

## 2022-02-20 MED ADMIN — prednisoLONE acetate (PRED FORTE) 1 % ophthalmic suspension 2 drop: 2 [drp] | OPHTHALMIC | @ 01:00:00

## 2022-02-20 MED ADMIN — fluticasone propionate (FLONASE) 50 mcg/actuation nasal spray 2 spray: 2 | NASAL | @ 13:00:00

## 2022-02-20 MED ADMIN — methylPREDNISolone sodium succinate (PF) (SOLU-medrol) injection 50 mg: 50 mg | INTRAVENOUS | @ 06:00:00 | Stop: 2022-02-21

## 2022-02-20 MED ADMIN — valACYclovir (VALTREX) tablet 500 mg: 500 mg | ORAL | @ 13:00:00

## 2022-02-20 MED ADMIN — cytarabine (PF) (ARA-C) 5,850 mg in sodium chloride (NS) 0.9 % 250 mL IVPB: 3 g/m2 | INTRAVENOUS | @ 19:00:00 | Stop: 2022-02-21

## 2022-02-20 MED ADMIN — cytarabine (PF) (ARA-C) 5,850 mg in sodium chloride (NS) 0.9 % 250 mL IVPB: 3 g/m2 | INTRAVENOUS | @ 07:00:00 | Stop: 2022-02-21

## 2022-02-20 MED ADMIN — cetirizine (ZyrTEC) tablet 10 mg: 10 mg | ORAL | @ 13:00:00

## 2022-02-20 MED ADMIN — prednisoLONE acetate (PRED FORTE) 1 % ophthalmic suspension 2 drop: 2 [drp] | OPHTHALMIC | @ 21:00:00

## 2022-02-20 MED ADMIN — sodium bicarbonate 150 mEq in dextrose 5 % 1,150 mL continuous infusion: 150 meq | INTRAVENOUS | @ 09:00:00 | Stop: 2022-02-20

## 2022-02-20 MED ADMIN — sodium chloride (NS) 0.9 % flush 10 mL: 10 mL | INTRAVENOUS | @ 01:00:00

## 2022-02-20 MED ADMIN — sodium chloride (OCEAN) 0.65 % nasal spray 2 spray: 2 | NASAL | @ 13:00:00

## 2022-02-20 MED ADMIN — dasatinib (SPRYCEL) tablet 70 mg: 70 mg | ORAL | @ 13:00:00

## 2022-02-20 MED ADMIN — sodium chloride (NS) 0.9 % flush 10 mL: 10 mL | INTRAVENOUS | @ 13:00:00

## 2022-02-20 MED ADMIN — leuCOVorin (WELLCOVORIN) tablet 25 mg: 25 mg | ORAL | @ 08:00:00 | Stop: 2022-02-20

## 2022-02-20 MED ADMIN — fluticasone propionate (FLONASE) 50 mcg/actuation nasal spray 2 spray: 2 | NASAL | @ 01:00:00

## 2022-02-20 MED ADMIN — leuCOVorin (WELLCOVORIN) tablet 25 mg: 25 mg | ORAL | @ 02:00:00

## 2022-02-20 MED ADMIN — dapsone tablet 100 mg: 100 mg | ORAL | @ 01:00:00

## 2022-02-20 MED ADMIN — potassium chloride ER tablet 20 mEq: 20 meq | ORAL | @ 02:00:00 | Stop: 2022-02-19

## 2022-02-20 MED ADMIN — sodium bicarbonate 150 mEq in dextrose 5 % 1,150 mL continuous infusion: 150 meq | INTRAVENOUS | @ 03:00:00

## 2022-02-20 MED ADMIN — furosemide (LASIX) injection 20 mg: 20 mg | INTRAVENOUS | @ 02:00:00 | Stop: 2022-02-19

## 2022-02-20 MED ADMIN — sodium chloride (OCEAN) 0.65 % nasal spray 2 spray: 2 | NASAL | @ 01:00:00

## 2022-02-20 MED ADMIN — ondansetron (ZOFRAN) tablet 24 mg: 24 mg | ORAL | @ 18:00:00 | Stop: 2022-02-22

## 2022-02-20 MED ADMIN — methylPREDNISolone sodium succinate (PF) (SOLU-medrol) injection 50 mg: 50 mg | INTRAVENOUS | @ 18:00:00 | Stop: 2022-02-21

## 2022-02-20 MED ADMIN — sodium chloride (OCEAN) 0.65 % nasal spray 2 spray: 2 | NASAL | @ 18:00:00

## 2022-02-20 MED ADMIN — prednisoLONE acetate (PRED FORTE) 1 % ophthalmic suspension 2 drop: 2 [drp] | OPHTHALMIC | @ 09:00:00

## 2022-02-20 NOTE — Unmapped (Signed)
Problem: Adult Inpatient Plan of Care  Goal: Plan of Care Review  Outcome: Ongoing - Unchanged  Goal: Patient-Specific Goal (Individualized)  Outcome: Ongoing - Unchanged  Goal: Absence of Hospital-Acquired Illness or Injury  Outcome: Ongoing - Unchanged  Intervention: Identify and Manage Fall Risk  Recent Flowsheet Documentation  Taken 02/19/2022 0700 by Idell Pickles, RN  Safety Interventions:   bleeding precautions   chemotherapeutic agent precautions   fall reduction program maintained   lighting adjusted for tasks/safety   low bed   nonskid shoes/slippers when out of bed  Intervention: Prevent Infection  Recent Flowsheet Documentation  Taken 02/19/2022 0700 by Idell Pickles, RN  Infection Prevention:   cohorting utilized   environmental surveillance performed   equipment surfaces disinfected   hand hygiene promoted   personal protective equipment utilized   rest/sleep promoted   single patient room provided   visitors restricted/screened  Goal: Optimal Comfort and Wellbeing  Outcome: Ongoing - Unchanged  Goal: Readiness for Transition of Care  Outcome: Ongoing - Unchanged  Goal: Rounds/Family Conference  Outcome: Ongoing - Unchanged

## 2022-02-20 NOTE — Unmapped (Signed)
Daily Progress Note      Principal Problem:    B-cell acute lymphoblastic leukemia (ALL) (CMS-HCC)       LOS: 4 days     Assessment/Plan:Assessment/Plan: 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     Today's plan: D4 C4B HyperCVAD=7/23. 24 Hr methotrexate level .51.  Frequent sinus pressure with drainage, no evidence of chronic or recent sinuses infections on CT or by ENT's examination.  Fluticasone and Neilmed nasal irrigation. Halls for symptomatic management of post nasal drip. Inpatient consult to neurology due to similarity of symptoms with atypical presentation of cluster headaches or migraines with family history of migraines in mother. Will see patient on Monday. IT Chemo in [facility] 7/21, follow for hempath.     Ph+ALL, in remission: Initially diagnosed in 09/2021 and underwent [id number] induction complicated by PICC line associated superficial thrombus. BMBx on 5/2 revealed morphologic CR. Admitted for Cycle 4B HyperCVAD + Dasatinib. Patient has completed #6 IT chemos prior to admission, with plan to do one per cycle due to intolerance of two/cycle.  - D1=7/20 Methotrexate, delayed due DDI with penicillin -   - Dasatinib 70 mg daily   - ppx Dapsone and Valtrex   [ ]  IT Cytarabine on 7/21 in [place], following hempath.  Paucicellular specimen with no blasts identified and too few cells for flow cytometry     Regimen: R-HyperCVAD  Cycle # 4B  Primary Oncologist: Senaida Ores     Even Cycles  Date  7/20           Day 1 2 3 4 5    Methotrexate IV 1000mg /m2 x           Cytarabine 3g/m2 Q12H   X  x X  x       Methotrexate IT 12mg    x            EVEN CYCLE DETAILS  - OK to treat placed to be placed on 7/20  - bicarb continuous infusion until MTX level <0.05 - - will discontinue today (7/23) given methotrexate less than 0.05  - Leucovorin titration until MTX level <0.05 - - will discontinue today (7/23) given methotrexate less than 0.05  - daily UA to maintain pH >/= 7.0      Disposition EVEN:   - NO G-CSF on discharge due to leukocytosis and malaise   - OP Rituxan - scheduled for 7/24 - needs to be adjusted   - Three times weekly labs - needs to be requested   - Dr. Senaida Ores follow up on 8/10 - scheduled      Acute on chronic sinusitis: Started Augmentin 875-125mg  PO BID x 10 days. Day 1 02/09/22 . Symptoms improving since starting augmentin. Due to interaction between methotrexate and penicillins, we will stop Augmentin. She has completed 7 full days and 1 dose of the 8th day, which is sufficient treatment for sinusitis. Patient voiced concern of pneumonia developing due to a history of pneumonia after a 7 day course. After lengthy discussion of risk and benefits of delaying chemotherapy further, patient agreeable to stop Augmentin and begin chemotherapy. We will continue to monitor.   -CT sinus w/o due to HDMTX infusion, without evidence sinus infection  [ ]  Consult to ENT out, appreicate assistance  [ ]  Neuro consult to see patient on Monday  - Fluticasone and NEILMED irrigation     Hemorrhoids: Symptoms significantly improved with OTC  Dr. Silvana Newness ointment. Given improvement, patient would like to defer GI consult at this time.   - Continue OTC ointment      Grade 1 peripheral neuropathy: Paraesthesias present primarily in her finger pads, currently without medical interventions continue to monitor while on therapy. Not affecting ADLs.     Anxiety: Reports taking Lexapro 10 mg at home  - No longer taking Lexapro     Immunocompromised status: Patient is immunocompromised secondary to disease and chemotherapy  -Antimicrobial prophylaxis as above     Impending Electrolyte Abnormality Secondary to Chemotherapy and/or IV Fluids  -Daily Electrolyte monitoring  -Replete per [facility] guidelines.      Impending Pancytopenia secondary to Acute Leukemia/chemotherapy:    - Transfuse 1 unit of pRBCs for hgb >7  - Transfuse 1 unit plt for plts >10K     Nutrition: Subjective:     Interval History: [name]. Patient reports feeling generally well, no acute complaints. She notes she is drinking well and going to try some snacks from the floor for breakfast. Methotrexate level 0.04 this morning.     Denies headache, changes in vision, sore throat, SOB, chest pain, abdominal pain, N/V/D/C, changes in urination, fevers, night sweats or chills.     10 point ROS otherwise negative except as above in the HPI.     Objective:     Vital signs in last 24 hours:  Temp:  [36.5 ??C (97.7 ??F)-37.2 ??C (99 ??F)] 36.6 ??C (97.9 ??F)  Heart Rate:  [88-108] 93  Resp:  [16-18] 16  BP: (97-126)/(72-86) 109/82  MAP (mmHg):  [81-97] 91  SpO2:  [93 %-96 %] 93 %    Intake/Output last 3 shifts:  I/O last 3 completed shifts:  In: 8067 [P.O.:1260; I.V.:6807]  Out: 4880 [Urine:4880]    Meds:  Current Facility-Administered Medications   Medication Dose Route Frequency Provider Last Rate Last Admin    acetaminophen (TYLENOL) tablet 650 mg  650 mg Oral Q8H PRN Maryanna Shape, PA   650 mg at 02/18/22 1735    cetirizine (ZyrTEC) tablet 10 mg  10 mg Oral Daily Herbert Moors, [place]   10 mg at 02/19/22 8469    CHEMO CLARIFICATION ORDER   Other Continuous PRN [name], MD        [facility] CLARIFICATION ORDER   Other Continuous PRN Herbert Moors, AGNP        CHEMO CLARIFICATION ORDER   Other Continuous PRN Lu Duffel, PA        cytarabine (PF) (ARA-C) 5,850 mg in sodium chloride (NS) 0.9 % 250 mL IVPB  3 g/m2 (Treatment Plan Recorded) Intravenous Q12H Nita Sells, MD   Stopped at 02/20/22 0516    dapsone tablet 100 mg  100 mg Oral Daily Herbert Moors, AGNP   100 mg at 02/19/22 2056    dasatinib (SPRYCEL) tablet 70 mg  70 mg Oral Daily [name], MD   70 mg at 02/19/22 6295    diphenhydrAMINE (BENADRYL) injection 25 mg  25 mg Intravenous Q4H PRN [name], MD        emollient combination no.92 (LUBRIDERM) lotion 1 application.  1 application. Topical Q1H PRN Maryanna Shape, PA        EPINEPHrine [facility]) injection 0.3 mg  0.3 mg Intramuscular Daily PRN [name], MD        famotidine (PF) (PEPCID) injection 20 mg  20 mg Intravenous Q4H PRN [name], MD  fluticasone propionate (FLONASE) 50 mcg/actuation nasal spray 2 spray  2 spray Each Nare BID Herbert Moors, AGNP   2 spray at 02/19/22 2054    IP OKAY TO TREAT   Other Continuous PRN Herbert Moors, AGNP        levalbuterol [facility]) nebulizer solution 0.31 mg  1 ampule Nebulization Q4H PRN Maryanna Shape, PA        loperamide (IMODIUM) capsule 2 mg  2 mg Oral Q2H PRN Maryanna Shape, PA        loperamide (IMODIUM) capsule 4 mg  4 mg Oral Once PRN Maryanna Shape, PA        menthol (COUGH DROPS) lozenge 1 lozenge  1 lozenge Oral Q2H PRN Herbert Moors, AGNP        meperidine (DEMEROL) injection 25 mg  25 mg Intravenous Q30 Min PRN [name], MD        methylPREDNISolone sodium succinate (PF) (SOLU-medrol) injection 125 mg  125 mg Intravenous Q4H PRN [name], MD        methylPREDNISolone sodium succinate (PF) (SOLU-medrol) injection 50 mg  50 mg Intravenous Q12H Providence Crosby Dittus, MD   50 mg at 02/20/22 0229    ondansetron (ZOFRAN) tablet 24 mg  24 mg Oral Q24H Estanislado Emms [place], [place]   24 mg at 02/19/22 1349    prednisoLONE acetate (PRED FORTE) 1 % ophthalmic suspension 2 drop  2 drop Both Eyes QID [name], MD   2 drop at 02/20/22 0526    prochlorperazine (COMPAZINE) injection 10 mg  10 mg Intravenous Q6H PRN [name], MD        prochlorperazine (COMPAZINE) tablet 10 mg  10 mg Oral Q6H PRN [name], MD        sodium chloride (NS) 0.9 % flush 10 mL  10 mL Intravenous BID Maryanna Shape, PA   10 mL at 02/19/22 2055    sodium chloride (NS) 0.9 % flush 10 mL  10 mL Intravenous BID Maryanna Shape, PA   10 mL at 02/19/22 2055    sodium chloride (NS) 0.9 % flush 10 mL  10 mL Intravenous BID Maryanna Shape, PA   10 mL at 02/19/22 1610    sodium chloride (NS) 0.9 % infusion  20 mL/hr Intravenous Continuous PRN [name], MD        sodium chloride (NS) 0.9 % infusion  20 mL/hr Intravenous Continuous Maryanna Shape, PA        sodium chloride (OCEAN) 0.65 % nasal spray 2 spray  2 spray Each Nare TID Herbert Moors, AGNP   2 spray at 02/19/22 2055    sodium chloride 0.9% (NS) bolus 1,000 mL  1,000 mL Intravenous Daily PRN [name], MD        valACYclovir (VALTREX) tablet 500 mg  500 mg Oral Daily Maryanna Shape, PA   500 mg at 02/19/22 9604       Physical Exam:  General: Resting, in no apparent distress, lying in bed  HEENT:  PERRL. No scleral icterus or conjunctival injection. MMM without ulceration, erythema or exudate. No cervical or axillary lymphadenopathy.   Heart:  RRR. S1, S2. No murmurs, gallops, or rubs.  Lungs:  Breathing is unlabored, and patient is speaking full sentences with ease.  No stridor.  CTAB. No rales, ronchi, or crackles.    Abdomen:  No distention or pain on palpation.  Bowel  sounds are present and normoactive x 4.  No palpable hepatomegaly or splenomegaly.  No palpable masses.  Skin:  No rashes, petechiae or purpura.  No areas of skin breakdown. Warm to touch, dry, smooth, and even.  Musculoskeletal:  No grossly-evident joint effusions or deformities.  Range of motion about the shoulder, elbow, hips and knees is grossly normal.  Psychiatric:  Range of affect is appropriate.    Neurologic:  Alert and oriented to person, place, time and situation.  Gait is normal.  No Nystagmus Cerebellar tasks (finger-to-nose, rapid hand movement, heel along shin) are completed with ease and are symmetric. CNII-CNXII grossly intact.  Extremities:  Appear well-perfused. No clubbing, edema, or cyanosis.  CVAD: R CW Port - no erythema, nontender; dressing CDI.      Labs:  Recent Labs     02/18/22  0354 02/19/22  0622 02/20/22  0526   WBC 6.2 6.2 8.2   NEUTROABS 4.9 5.3 8.0*   LYMPHSABS 1.1 0.6* 0.2*   HGB 8.5* 8.0* 7.8*   HCT 24.6* 23.0* 22.2*   PLT 173 184 205   CREATININE 0.64 - 0.65 0.59* 0.60 - 0.60   BUN 13 - 12 13 16  - 16   BILITOT  --   --  1.0 - 1.0   BILIDIR  --   --  0.30   AST  --   --  39* - 39*   ALT  --   --  91* - 91*   ALKPHOS  --   --  70 - 70   K 3.6 - 3.6 3.4 3.4 - 3.4   CALCIUM 9.0 - 8.6* 8.3* 8.3* - 8.3*   NA 143 - 144 144 144 - 144   CL 111* - 113* 109* 104 - 104   CO2 23.0 - 23.0 30.0 34.0* - 34.0*         Imaging:  CT sinus 7/20  Frontal sinuses are clear. Frontal recesses are patent. Ethmoid air cells are clear. Minimal mucosal thickening of the right sphenoid sinus (5:56). Left sphenoid sinus is clear. Sphenoethmoidal recesses are patent. Minimal mucosal thickening of the right maxillary sinus (4:40). Left maxillary sinus is clear. Ostiomeatal units are patent.     Philippa Sicks, MD PhD  02/20/2022  6:55 AM   Hematology/Oncology HO IV  [facility]   Group pager: [phone]

## 2022-02-20 NOTE — Unmapped (Signed)
OTO Lasix and potassium administered as ordered. Cytarabine administered without complications.     Problem: Adult Inpatient Plan of Care  Goal: Plan of Care Review  Outcome: Ongoing - Unchanged  Goal: Patient-Specific Goal (Individualized)  Outcome: Ongoing - Unchanged  Goal: Absence of Hospital-Acquired Illness or Injury  Outcome: Ongoing - Unchanged  Intervention: Identify and Manage Fall Risk  Recent Flowsheet Documentation  Taken 02/19/2022 1925 by Eusebio Me, RN  Safety Interventions:   bleeding precautions   chemotherapeutic agent precautions   environmental modification   infection management   isolation precautions   lighting adjusted for tasks/safety   low bed   neutropenic precautions   nonskid shoes/slippers when out of bed  Intervention: Prevent and Manage VTE (Venous Thromboembolism) Risk  Recent Flowsheet Documentation  Taken 02/19/2022 2000 by Eusebio Me, RN  VTE Prevention/Management:   fluids promoted   intravenous hydration  Taken 02/19/2022 1925 by Eusebio Me, RN  Activity Management: activity adjusted per tolerance  Range of Motion: Bilateral Upper and Lower Extremities  Intervention: Prevent Infection  Recent Flowsheet Documentation  Taken 02/19/2022 1925 by Eusebio Me, RN  Infection Prevention:   cohorting utilized   environmental surveillance performed   equipment surfaces disinfected   hand hygiene promoted   personal protective equipment utilized   rest/sleep promoted   single patient room provided   visitors restricted/screened  Goal: Optimal Comfort and Wellbeing  Outcome: Ongoing - Unchanged  Goal: Readiness for Transition of Care  Outcome: Ongoing - Unchanged  Goal: Rounds/Family Conference  Outcome: Ongoing - Unchanged

## 2022-02-21 LAB — CBC W/ AUTO DIFF
BASOPHILS ABSOLUTE COUNT: 0 10*9/L (ref 0.0–0.1)
BASOPHILS RELATIVE PERCENT: 0.1 %
EOSINOPHILS ABSOLUTE COUNT: 0 10*9/L (ref 0.0–0.5)
EOSINOPHILS RELATIVE PERCENT: 0 %
HEMATOCRIT: 19 % — ABNORMAL LOW (ref 34.0–44.0)
HEMOGLOBIN: 6.6 g/dL — ABNORMAL LOW (ref 11.3–14.9)
LYMPHOCYTES ABSOLUTE COUNT: 0.1 10*9/L — ABNORMAL LOW (ref 1.1–3.6)
LYMPHOCYTES RELATIVE PERCENT: 2 %
MEAN CORPUSCULAR HEMOGLOBIN CONC: 34.9 g/dL (ref 32.0–36.0)
MEAN CORPUSCULAR HEMOGLOBIN: 33.7 pg — ABNORMAL HIGH (ref 25.9–32.4)
MEAN CORPUSCULAR VOLUME: 96.6 fL — ABNORMAL HIGH (ref 77.6–95.7)
MEAN PLATELET VOLUME: 6.5 fL — ABNORMAL LOW (ref 6.8–10.7)
MONOCYTES ABSOLUTE COUNT: 0 10*9/L — ABNORMAL LOW (ref 0.3–0.8)
MONOCYTES RELATIVE PERCENT: 0.2 %
NEUTROPHILS ABSOLUTE COUNT: 3 10*9/L (ref 1.8–7.8)
NEUTROPHILS RELATIVE PERCENT: 97.7 %
PLATELET COUNT: 178 10*9/L (ref 150–450)
RED BLOOD CELL COUNT: 1.97 10*12/L — ABNORMAL LOW (ref 3.95–5.13)
RED CELL DISTRIBUTION WIDTH: 24.7 % — ABNORMAL HIGH (ref 12.2–15.2)
WBC ADJUSTED: 3.1 10*9/L — ABNORMAL LOW (ref 3.6–11.2)

## 2022-02-21 LAB — BASIC METABOLIC PANEL
ANION GAP: 6 mmol/L (ref 5–14)
BLOOD UREA NITROGEN: 19 mg/dL (ref 9–23)
BUN / CREAT RATIO: 32
CALCIUM: 8.4 mg/dL — ABNORMAL LOW (ref 8.7–10.4)
CHLORIDE: 108 mmol/L — ABNORMAL HIGH (ref 98–107)
CO2: 29 mmol/L (ref 20.0–31.0)
CREATININE: 0.6 mg/dL
EGFR CKD-EPI (2021) FEMALE: >90 mL/min/{1.73_m2} (ref >=60)
GLUCOSE RANDOM: 114 mg/dL (ref 70–179)
POTASSIUM: 4 mmol/L (ref 3.4–4.8)
SODIUM: 143 mmol/L (ref 135–145)

## 2022-02-21 LAB — URINALYSIS WITH MICROSCOPY
BACTERIA: NONE SEEN /HPF
BILIRUBIN UA: NEGATIVE
BLOOD UA: NEGATIVE
GLUCOSE UA: NEGATIVE
KETONES UA: NEGATIVE
LEUKOCYTE ESTERASE UA: NEGATIVE
NITRITE UA: NEGATIVE
PH UA: 7 (ref 5.0–9.0)
PROTEIN UA: NEGATIVE
RBC UA: <1 /HPF (ref <=4)
SPECIFIC GRAVITY UA: 1.02 (ref 1.003–1.030)
SQUAMOUS EPITHELIAL: <1 /HPF (ref 0–5)
UROBILINOGEN UA: <2
WBC UA: <1 /HPF (ref 0–5)

## 2022-02-21 MED ORDER — BISMUTH SUBSALICYLATE 262 MG/15 ML ORAL SUSPENSION
Freq: Four times a day (QID) | ORAL | 0 refills | 2 days | Status: CP | PRN
Start: 2022-02-21 — End: 2022-03-23
  Filled 2022-02-21: qty 237, 2d supply, fill #0

## 2022-02-21 MED ORDER — FLUTICASONE PROPIONATE 50 MCG/ACTUATION NASAL SPRAY,SUSPENSION
Freq: Two times a day (BID) | NASAL | 0 refills | 30 days | Status: CP
Start: 2022-02-21 — End: 2023-02-21
  Filled 2022-02-21: qty 16, 15d supply, fill #0

## 2022-02-21 MED ORDER — NEILMED SINUS RINSE COMPLETE WITH PACKET
PACK | Freq: Two times a day (BID) | NASAL | 0 refills | 25 days | Status: CP
Start: 2022-02-21 — End: 2022-03-23
  Filled 2022-02-21: qty 50, 25d supply, fill #0

## 2022-02-21 MED ADMIN — sodium chloride (OCEAN) 0.65 % nasal spray 2 spray: 2 | NASAL | @ 13:00:00 | Stop: 2022-02-21

## 2022-02-21 MED ADMIN — prednisoLONE acetate (PRED FORTE) 1 % ophthalmic suspension 2 drop: 2 [drp] | OPHTHALMIC | @ 10:00:00 | Stop: 2022-02-21

## 2022-02-21 MED ADMIN — valACYclovir (VALTREX) tablet 500 mg: 500 mg | ORAL | @ 13:00:00 | Stop: 2022-02-21

## 2022-02-21 MED ADMIN — sodium chloride (NS) 0.9 % flush 10 mL: 10 mL | INTRAVENOUS | @ 13:00:00 | Stop: 2022-02-21

## 2022-02-21 MED ADMIN — bismuth subsalicylate (PEPTO-BISMOL) oral suspension: 30 mL | ORAL | @ 18:00:00 | Stop: 2022-02-21

## 2022-02-21 MED ADMIN — methylPREDNISolone sodium succinate (PF) (SOLU-medrol) injection 50 mg: 50 mg | INTRAVENOUS | @ 07:00:00 | Stop: 2022-02-21

## 2022-02-21 MED ADMIN — cetirizine (ZyrTEC) tablet 10 mg: 10 mg | ORAL | @ 13:00:00 | Stop: 2022-02-21

## 2022-02-21 MED ADMIN — cytarabine (PF) (ARA-C) 5,850 mg in sodium chloride (NS) 0.9 % 250 mL IVPB: 3 g/m2 | INTRAVENOUS | @ 07:00:00 | Stop: 2022-02-21

## 2022-02-21 MED ADMIN — fluticasone propionate (FLONASE) 50 mcg/actuation nasal spray 2 spray: 2 | NASAL | @ 13:00:00 | Stop: 2022-02-21

## 2022-02-21 MED ADMIN — fluticasone propionate (FLONASE) 50 mcg/actuation nasal spray 2 spray: 2 | NASAL

## 2022-02-21 MED ADMIN — bismuth subsalicylate (PEPTO-BISMOL) oral suspension: 30 mL | ORAL | @ 03:00:00 | Stop: 2022-02-20

## 2022-02-21 MED ADMIN — sodium chloride (OCEAN) 0.65 % nasal spray 2 spray: 2 | NASAL

## 2022-02-21 MED ADMIN — sodium chloride (NS) 0.9 % flush 10 mL: 10 mL | INTRAVENOUS

## 2022-02-21 MED ADMIN — dasatinib (SPRYCEL) tablet 70 mg: 70 mg | ORAL | @ 13:00:00 | Stop: 2022-02-21

## 2022-02-21 MED ADMIN — acetaminophen (TYLENOL) tablet 650 mg: 650 mg | ORAL | @ 13:00:00 | Stop: 2022-02-21

## 2022-02-21 MED ADMIN — dapsone tablet 100 mg: 100 mg | ORAL

## 2022-02-21 MED ADMIN — sodium chloride (OCEAN) 0.65 % nasal spray 2 spray: 2 | NASAL | @ 18:00:00 | Stop: 2022-02-21

## 2022-02-21 MED ADMIN — prednisoLONE acetate (PRED FORTE) 1 % ophthalmic suspension 2 drop: 2 [drp] | OPHTHALMIC | @ 16:00:00 | Stop: 2022-02-21

## 2022-02-21 MED ADMIN — prednisoLONE acetate (PRED FORTE) 1 % ophthalmic suspension 2 drop: 2 [drp] | OPHTHALMIC

## 2022-02-21 NOTE — Unmapped (Signed)
Vss. No falls this shift. Call bell within reach. Reports nausea/vomiting. See MAR. pRBCs transfused as ordered. Discharge teaching reviewed with patient and  caregiver.  Problem: Adult Inpatient Plan of Care  Goal: Plan of Care Review  Outcome: Resolved  Goal: Patient-Specific Goal (Individualized)  Outcome: Resolved  Goal: Absence of Hospital-Acquired Illness or Injury  Outcome: Resolved  Intervention: Identify and Manage Fall Risk  Recent Flowsheet Documentation  Taken 02/21/2022 0847 by Wanda Plump, RN  Safety Interventions:   bleeding precautions   isolation precautions   low bed   neutropenic precautions   nonskid shoes/slippers when out of bed  Goal: Optimal Comfort and Wellbeing  Outcome: Resolved  Goal: Readiness for Transition of Care  Outcome: Resolved  Goal: Rounds/Family Conference  Outcome: Resolved

## 2022-02-21 NOTE — Unmapped (Addendum)
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       Ph+ALL, in remission: Initially diagnosed in 09/2021 and underwent [id number] induction complicated by PICC line associated superficial thrombus. BMBx on 5/2 revealed morphologic CR. Admitted for Cycle 4B HyperCVAD + Dasatinib. Patient has completed #6 IT chemos prior to admission, with plan to do one per cycle due to intolerance of two/cycle. #7 Completed at the time of discharge.  - D1=7/20 Methotrexate, delayed due DDI with penicillin -   - Dasatinib 70 mg daily   - ppx Dapsone and Valtrex, levaquin and Fluconazole   -IT Cytarabine on 7/21 in [place], following hempath.Paucicellular specimen with no blasts identified and too few cells for flow cytometry    Acute on chronic sinusitis vs Chronic Migraines: Started Augmentin 875-125mg  PO BID x 10 days. Day 1 02/09/22 . Symptoms improving since starting augmentin. Due to interaction between methotrexate and penicillins, we will stop Augmentin. She has completed 7 full days and 1 dose of the 8th day, which is sufficient treatment for sinusitis. Patient voiced concern of pneumonia developing due to a history of pneumonia after a 7 day course. After lengthy discussion of risk and benefits of delaying chemotherapy further, patient agreeable to stop Augmentin and begin chemotherapy. We will continue to monitor.    CT sinus w/o due to HDMTX infusion, without evidence sinus infection  - Fluticasone and NEILMED irrigation   [ ]  Neurology consulted to assist w determination of Migraine vs Sinusitis. Will follow up note for additional recs once finalized.  Neurology to schedule follow up.     hemorrhoids: Symptoms significantly improved with OTC Dr. Silvana Newness ointment. Given improvement, patient would like to defer GI consult at this time.   - Continue OTC ointment      Grade 1 peripheral neuropathy: Paraesthesias present primarily in her finger pads, currently without medical interventions continue to monitor while on therapy. Not affecting ADLs.     Anxiety: Reports taking Lexapro 10 mg at home  - No longer taking Lexapro     Immunocompromised status: Patient is immunocompromised secondary to disease and chemotherapy  -Antimicrobial prophylaxis as above     Impending Electrolyte Abnormality Secondary to Chemotherapy and/or IV Fluids  -Daily Electrolyte monitoring  -Replete per [facility] guidelines.      Impending Pancytopenia secondary to Acute Leukemia/chemotherapy:    - Transfuse 1 unit of pRBCs for hgb >7  - Transfuse 1 unit plt for plts >10K

## 2022-02-21 NOTE — Unmapped (Signed)
Follow-up Appointment Request  Provider: Idelia Salm DO  Reason/Diagnosis: Migraine headaches  Priority level: Priority 4

## 2022-02-21 NOTE — Unmapped (Signed)
Initial Consult Note        Requesting Attending Physician:  [facility] Cros[facility] Dittu*  Service Requesting Consult: Oncology/Hematology (MDE)     Assessment and Plan          Patricia [name] is a 46 y.o. right handed female on whom I have been asked by [facility] Dittu* to consult for evaluation of chronic headaches vs recurrent sinusitis.    Migraine headaches: We suspect migraine as a cause of her unilateral head, neck, and sinus pain in addition to post-nasal drip and rhinorrhea. Reported symptoms (right-sided unilaterality, severe but dull pain lasting for days, and relief with noise and light reduction) are more consistent with migraine headaches than cluster headaches. Despite symptom improvement with Augmentin, CT sinus performed 02/17/22 revealed only minimal mucosal thickening of the right maxillary and right sphenoid sinuses. Otolaryngology consult note from 02/17/22 noted no signs of active infection. Past CT scan was also without signs of acute or chronic sinusitis.    Migraine etiology is likely multifactorial, though reported snoring suggests that OSA could be contributing. Chronic ibuprofen use also raises suspicion for rebound headaches.    We recommend conservative management for these migraine headaches and outpatient follow-up with neurology clinic to discuss further treatment options.    - Magnesium and riboflavin supplementation, PO daily  - Use of heat for neck tension relief  - At-home neck stretches  - Follow-up appointment with neurology clinic, which will be placed by the on call neurology resident.   - Consider future outpatient polysomnography to assess OSA as a possible migraine trigger  - Consider modification of ibuprofen use to reduce possibility of rebound headaches  - Consider future abortive medications if migraines persist or significantly affect quality of life  - No further neurological imaging is required at this time.     Assessment and plan discussed with primary team by phone    This patient was seen and discussed with Dr. Lysle Rubens, who agrees with the above assessment and plan.    We will sign off at this time. Please re-consult if further neurologic questions arise.    Em Price, MS4    Idelia Salm DO PGY-4  [facility] Neurology          HPI        Reason for Consult: cluster headache vs migraine headache vs acute on chronic sinusitis    Patricia [name] is a 46 y.o. right handed female on whom I have been asked by [facility] Dittu* to consult for chronic headaches vs recurrent sinusitis.    Patricia [name] complains of headache episodes occurring approximately 4 times a year for many years, previously diagnosed as sinusitis and treated with Augmentin (most recent course 02/09/22 for 7 days and with 1 dose on the 8th day with improvement of symptoms, stopped for initiation of chemotherapy for ALL now in remission). Per patient, recent consult with ENT elicited no suspicion for current or recent sinusitis.     Headaches are described as severe, dull, right-sided pain of the forehead and sinuses lasting for days at a time that is variably relieved by sleep, cold compress over the eyes, and minimizing light and sound. The pain is accompanied by post-nasal drip and rhinorrhea, and episodes have correlated with seasonal allergies in the past. Symptoms have been relieved with Augmentin. Patricia [name] denies stabbing pain or pain in her right eye. She denies preceding aura or correlation with menstrual cycles, though she does endorse dysmenorrhea relieved only with ibuprofen. In the past,  she has used significant amounts of ibuprofen multiple times per week for treatment of dysmenorrhea, headache, and other pains. She also endorses shoulder and neck tension complicated by an abnormality in her spinal curvature. Additionally, she reports loud, nightly snoring for many years.      Allergies   Allergen Reactions   ??? Erythromycin Hives   ??? Other      Pt cant take any pain reliever or fever reducer due to condition. ??? Sulfa (Sulfonamide Antibiotics) Anaphylaxis   ??? Azithromycin         Current Facility-Administered Medications   Medication Dose Route Frequency Provider Last Rate Last Admin   ??? acetaminophen (TYLENOL) tablet 650 mg  650 mg Oral Q8H PRN Maryanna Shape, PA   650 mg at 02/21/22 0856   ??? bismuth subsalicylate (PEPTO-BISMOL) oral suspension  30 mL Oral Q6H PRN [place], PA       ??? cetirizine (ZyrTEC) tablet 10 mg  10 mg Oral Daily Herbert Moors, AGNP   10 mg at 02/21/22 0841   ??? CHEMO CLARIFICATION ORDER   Other Continuous PRN [name], MD       ??? CHEMO CLARIFICATION ORDER   Other Continuous PRN Herbert Moors, AGNP       ??? CHEMO CLARIFICATION ORDER   Other Continuous PRN Lu Duffel, PA       ??? dapsone tablet 100 mg  100 mg Oral Daily Herbert Moors, AGNP   100 mg at 02/20/22 2025   ??? dasatinib (SPRYCEL) tablet 70 mg  70 mg Oral Daily [name], MD   70 mg at 02/21/22 0841   ??? diphenhydrAMINE (BENADRYL) injection 25 mg  25 mg Intravenous Q4H PRN [name], MD       ??? emollient combination no.92 (LUBRIDERM) lotion 1 application.  1 application. Topical Q1H PRN Maryanna Shape, PA       ??? EPINEPHrine [facility]) injection 0.3 mg  0.3 mg Intramuscular Daily PRN [name], MD       ??? famotidine (PF) (PEPCID) injection 20 mg  20 mg Intravenous Q4H PRN [name], MD       ??? fluticasone propionate (FLONASE) 50 mcg/actuation nasal spray 2 spray  2 spray Each Nare BID Herbert Moors, AGNP   2 spray at 02/21/22 0841   ??? IP OKAY TO TREAT   Other Continuous PRN Herbert Moors, AGNP       ??? levalbuterol (XOPENEX) nebulizer solution 0.31 mg  1 ampule Nebulization Q4H PRN Maryanna Shape, PA       ??? loperamide (IMODIUM) capsule 2 mg  2 mg Oral Q2H PRN Maryanna Shape, PA       ??? loperamide (IMODIUM) capsule 4 mg  4 mg Oral Once PRN Maryanna Shape, PA       ??? menthol (COUGH DROPS) lozenge 1 lozenge  1 lozenge Oral Q2H PRN Herbert Moors, AGNP       ??? meperidine (DEMEROL) injection 25 mg  25 mg Intravenous Q30 Min PRN [name], MD       ??? methylPREDNISolone sodium succinate (PF) (SOLU-medrol) injection 125 mg  125 mg Intravenous Q4H PRN [name], MD       ??? ondansetron [facility]) tablet 24 mg  24 mg Oral Q24H [place] [place] [address], [place]   24 mg at 02/20/22 1356   ??? prednisoLONE acetate (PRED FORTE) 1 % ophthalmic suspension 2 drop  2 drop Both Eyes QID [name], MD   2 drop at 02/21/22 1216   ??? prochlorperazine (COMPAZINE) injection 10 mg  10 mg Intravenous Q6H PRN [name], MD       ??? prochlorperazine (COMPAZINE) injection 10 mg  10 mg Intravenous Once [employer], PA       ??? prochlorperazine (COMPAZINE) tablet 10 mg  10 mg Oral Q6H PRN [name], MD       ??? sodium chloride (NS) 0.9 % flush 10 mL  10 mL Intravenous BID Maryanna Shape, PA   10 mL at 02/20/22 2024   ??? sodium chloride (NS) 0.9 % flush 10 mL  10 mL Intravenous BID Maryanna Shape, PA   10 mL at 02/21/22 0844   ??? sodium chloride (NS) 0.9 % flush 10 mL  10 mL Intravenous BID Maryanna Shape, PA   10 mL at 02/21/22 0844   ??? sodium chloride (NS) 0.9 % infusion  20 mL/hr Intravenous Continuous PRN [name], MD       ??? sodium chloride (NS) 0.9 % infusion  20 mL/hr Intravenous Continuous Maryanna Shape, PA       ??? sodium chloride (OCEAN) 0.65 % nasal spray 2 spray  2 spray Each Nare TID Herbert Moors, AGNP   2 spray at 02/21/22 0841   ??? sodium chloride 0.9% (NS) bolus 1,000 mL  1,000 mL Intravenous Daily PRN [name], MD       ??? valACYclovir (VALTREX) tablet 500 mg  500 mg Oral Daily Maryanna Shape, [place]   500 mg at 02/21/22 1610       Past Medical History:   Diagnosis Date   ??? 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 IR INSERT PORT AGE GREATER THAN 5 YRS  12/08/2021    IR INSERT PORT AGE GREATER THAN 5 YRS 12/08/2021 [name], PA IMG VIR [facility]       Social History     Socioeconomic History   ??? Marital status: Married     Spouse name: None   ??? Number of children: None   ??? Years of education: None   ??? Highest education level: None   Tobacco Use   ??? Smoking status: Never   ??? Smokeless tobacco: Never     Social Determinants of Health     Financial Resource Strain: Low Risk    ??? Difficulty of Paying Living Expenses: Not hard at all   Food Insecurity: No Food Insecurity   ??? Worried About Programme researcher, broadcasting/film/video in the Last Year: Never true   ??? Ran Out of Food in the Last Year: Never true   Transportation Needs: No Transportation Needs   ??? Lack of Transportation (Medical): No   ??? Lack of Transportation (Non-Medical): No       History reviewed. No pertinent family history.    Code Status: Full Code     Review of Systems     A 10-system review of systems was conducted and was negative except as documented above in the HPI.       Objective        Temp:  [36.6 ??C (97.9 ??F)-37.4 ??C (99.4 ??F)] 37.2 ??C (99 ??F)  Heart Rate:  [86-106] 91  Resp:  [15-18] 15  BP: (108-131)/(77-91) 123/84  MAP (mmHg):  [84-102] 96  SpO2:  [92 %-96 %] 94 %  I/O this shift:  In: 60 [P.O.:60]  Out: 500 [Urine:500]    Physical Exam:  General Appearance:Well appearing. In no acute distress.  HEENT: Head is atraumatic and normocephalic. Sclera anicteric without injection. Oropharyngeal membranes are moist with no erythema or exudate.  Neck: Supple.  Lungs: Clear to auscultation in anterior fields. No wheezes or crackles.  Heart: Regular rate and rhythm. No murmurs, rubs, or gallops.  Abdomen: Soft, nontender, nondistended.  Extremities: No clubbing, cyanosis, or edema.    Neurological Examination:     Mental Status: Alert, conversant, able to follow conversation and interview. Spontaneous speech was fluent without word finding pauses, dysarthria, or paraphasic errors. Comprehension was intact to simple and multi-step commands. Memory for recent and remote events was intact.    Cranial Nerves: PERRL 3 mm. Pursuit eye movements were uninterrupted with full range and without more than end-gaze nystagmus. Facial sensation intact bilaterally to light touch on the forehead, cheek, and chin. Face symmetric at rest. Normal facial movement bilaterally, including forehead, eye closure and grimace/smile. Hearing intact to conversation. Shoulder shrug full strength bilaterally. Palate movement is symmetric. Tongue protrudes midline and tongue movements are normal.    Motor Exam: Normal bulk.  No tremors, myoclonus, or other adventitious movement.  Pronator drift is absent.  Increased muscle tension in the trapezius, splenius capitus, splenius cervicis muscles.   RUE: 5/5 grossly throughout.  LUE: 5/5 grossly throughout.  RLE: 5/5 grossly throughout.  LLE: 5/5 grossly throughout.      Reflexes:   R L   Biceps +2 +2   Brachioradialis +2 +2   Triceps +2 +2   Patella +1 +1   Achilles +1 +1   Toes are downgoing bilaterally.    Sensory: Sensation normal to light touch, pinprick and temperature sensation to cold in both hands and both feet and to position sense and vibration distally in the fingers and toes.    Cerebellar/Coordination/Gait: Rapid alternating movements are normal in bilateral upper extremities. Finger-to-nose is normal without ataxia or dysmetria bilaterally. Heel-to-shin is normal without ataxia or dysmetria bilaterally. Gait exam demonstrates normal posture, base, stride length, arm swing and turns.          Diagnostic Studies      All Labs Last 24hrs:   Recent Results (from the past 24 hour(s))   Basic Metabolic Panel    Collection Time: 02/21/22  3:07 AM   Result Value Ref Range    Sodium 143 135 - 145 mmol/L    Potassium 4.0 3.4 - 4.8 mmol/L    Chloride 108 (H) 98 - 107 mmol/L    CO2 29.0 20.0 - 31.0 mmol/L    Anion Gap 6 5 - 14 mmol/L    BUN 19 9 - 23 mg/dL    Creatinine 1.61 0.96 - 0.80 mg/dL BUN/Creatinine Ratio 32     eGFR CKD-EPI (2021) Female >90 >=60 mL/min/1.94m2    Glucose 114 70 - 179 mg/dL    Calcium 8.4 (L) 8.7 - 10.4 mg/dL   CBC w/ Differential    Collection Time: 02/21/22  3:07 AM   Result Value Ref Range    WBC 3.1 (L) 3.6 - 11.2 10*9/L    RBC 1.97 (L) 3.95 - 5.13 10*12/L    HGB 6.6 (L) 11.3 - 14.9 g/dL    HCT 04.5 (L) 40.9 - 44.0 %    MCV 96.6 (H) 77.6 - 95.7 fL    MCH 33.7 (H) 25.9 -  32.4 pg    MCHC 34.9 32.0 - 36.0 g/dL    RDW 81.1 (H) 91.4 - 15.2 %    MPV 6.5 (L) 6.8 - 10.7 fL    Platelet 178 150 - 450 10*9/L    Neutrophils % 97.7 %    Lymphocytes % 2.0 %    Monocytes % 0.2 %    Eosinophils % 0.0 %    Basophils % 0.1 %    Absolute Neutrophils 3.0 1.8 - 7.8 10*9/L    Absolute Lymphocytes 0.1 (L) 1.1 - 3.6 10*9/L    Absolute Monocytes 0.0 (L) 0.3 - 0.8 10*9/L    Absolute Eosinophils 0.0 0.0 - 0.5 10*9/L    Absolute Basophils 0.0 0.0 - 0.1 10*9/L    Anisocytosis Marked (A) Not Present   Type and Screen    Collection Time: 02/21/22  6:21 AM   Result Value Ref Range    ABO Grouping O POS     Antibody Screen NEG    Urinalysis with Microscopy    Collection Time: 02/21/22  7:15 AM   Result Value Ref Range    Color, UA Yellow     Clarity, UA Clear     Specific Gravity, UA 1.020 1.003 - 1.030    pH, UA 7.0 5.0 - 9.0    Leukocyte Esterase, UA Negative Negative    Nitrite, UA Negative Negative    Protein, UA Negative Negative    Glucose, UA Negative Negative    Ketones, UA Negative Negative    Urobilinogen, UA <2.0 mg/dL <7.8 mg/dL    Bilirubin, UA Negative Negative    Blood, UA Negative Negative    RBC, UA <1 <=4 /HPF    WBC, UA <1 0 - 5 /HPF    Squam Epithel, UA <1 0 - 5 /HPF    Bacteria, UA None Seen None Seen /HPF   Prepare RBC    Collection Time: 02/21/22  8:25 AM   Result Value Ref Range    Crossmatch Compatible     Unit Blood Type O Pos     ISBT Number 5100     Unit # [id number]     Status Issued     Spec Expiration [id number]     Product ID Red Blood Cells     PRODUCT CODE [id number] Prepare RBC    Collection Time: 02/21/22 11:57 AM   Result Value Ref Range    Crossmatch Compatible     Unit Blood Type O Pos     ISBT Number 5100     Unit # [id number]     Status Issued     Spec Expiration [id number]     Product ID Red Blood Cells     PRODUCT CODE [id number]        The following radiographic studies were personally reviewed: CT sinus Wo contrast (02/17/22), XR Chest (02/17/22)

## 2022-02-21 NOTE — Unmapped (Signed)
Physician Discharge Summary [facility]  [name]  [address]  [place] [address]  Dept: [phone]  Loc: [phone]     Identifying Information:   Patricia [name]  1975-10-07  [id number]    Primary Care Physician: Patricia Lew, MD     Referring Physician: Doreatha [name]     Code Status: Full Code    Admit Date: 02/16/2022    Discharge Date: 02/21/2022     Discharge To: Home    Discharge Service: Patricia [name] - Hematology APP Floor Team (MEDQ)     Discharge Attending Physician: Patricia Crosby Dittus, MD    Discharge Diagnoses:  Principal Problem:    B-cell acute lymphoblastic leukemia (ALL) (CMS-HCC)  Resolved Problems:    * No resolved hospital problems. *      Outpatient Provider Follow Up Issues:   Supportive Care Recommendations:  We recommend based on the patient???s underlying diagnosis and treatment history the following supportive care:    1. Antimicrobial prophylaxis:  ALL (in remission, receiving post-remission therapy): Bacterial: Levofloxacin 500mg  PO daily (when absolute neutrophils </= 0.5);   Fungal: Fluconazole 400mg  PO daily (when absolute neutrophils </= 0.5);   Viral: Valacyclovir 500mg  PO daily (Continuous);   PJP:  For SMX/TMP intolerant patients:  Dapsone 100mg  PO daily; monthly nebulized pentamidine    2. Blood product support:  Leukoreduced blood products are required.  Irradiated blood products are preferred, but in case of urgent transfusion needs non-irradiated blood products may be used:     -  RBC transfusion threshold: transfuse 2 units for Hgb < 7 g/dL.  -  Platelet transfusion threshold: transfuse 1 unit of platelets for platelet count < 10, or for bleeding or need for invasive procedure.    Based on the patient's disease status and intensity of therapy, complete blood count with differential should be evaluated 3 times per week and used to guide transfusion support      4. 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       Ph+ALL, in remission: Initially diagnosed in 09/2021 and underwent [id number] induction complicated by PICC line associated superficial thrombus. BMBx on 5/2 revealed morphologic CR. Admitted for Cycle 4B HyperCVAD + Dasatinib. Patient has completed #6 IT chemos prior to admission, with plan to do one per cycle due to intolerance of two/cycle. #7 Completed at the time of discharge.  - D1=7/20 Methotrexate, delayed due DDI with penicillin -   - Dasatinib 70 mg daily   - ppx Dapsone and Valtrex, levaquin and Fluconazole   -IT Cytarabine on 7/21 in [place], following hemp[place].Paucicellular specimen with no blasts identified and too few cells for flow cytometry    Acute on chronic sinusitis vs Chronic Migraines: Started Augmentin 875-125mg  PO BID x 10 days. Day 1 02/09/22 . Symptoms improving since starting augmentin. Due to interaction between methotrexate and penicillins, we will stop Augmentin. She has completed 7 full days and 1 dose of the 8th day, which is sufficient treatment for sinusitis. Patient voiced concern of pneumonia developing due to a history of pneumonia after a 7 day course. After lengthy discussion of risk and benefits of delaying chemotherapy further, patient agreeable to stop Augmentin and begin chemotherapy. We will continue to monitor.    CT sinus w/o due to HDMTX infusion,  without evidence sinus infection  - Fluticasone and NEILMED irrigation   [ ]  Neurology consulted to assist w determination of Migraine vs Sinusitis. Will follow up note for additional recs once finalized.  Neurology to schedule follow up.     hemorrhoids: Symptoms significantly improved with OTC Dr. Silvana Newness ointment. Given improvement, patient would like to defer GI consult at this time.   - Continue OTC ointment      Grade 1 peripheral neuropathy: Paraesthesias present primarily in her finger pads, currently without medical interventions continue to monitor while on therapy. Not affecting ADLs.     Anxiety: Reports taking Lexapro 10 mg at home  - No longer taking Lexapro     Immunocompromised status: Patient is immunocompromised secondary to disease and chemotherapy  -Antimicrobial prophylaxis as above     Impending Electrolyte Abnormality Secondary to Chemotherapy and/or IV Fluids  -Daily Electrolyte monitoring  -Replete per [name] guidelines.      Impending Pancytopenia secondary to Acute Leukemia/chemotherapy:    - Transfuse 1 unit of pRBCs for hgb >7  - Transfuse 1 unit plt for plts >10K     Procedures:  None  ONCBCN INFUSION APPOINTMENT REQUEST [[id number]]  ______________________________________________________________________  Discharge Medications:     Your Medication List        STOP taking these medications      amoxicillin-clavulanate 875-125 mg per tablet  Commonly known as: AUGMENTIN     dexAMETHasone 4 MG tablet  Commonly known as: DECADRON     escitalopram oxalate 10 MG tablet  Commonly known as: LEXAPRO     hydrocortisone 1 % cream     nifedipine 0.3% lidocaine 1.5% in petrolatum ointment            START taking these medications      bismuth subsalicylate 262 mg/15 mL suspension  Commonly known as: PEPTO BISMOL  Take 30 mL by mouth every six (6) hours as needed for indigestion.     fluticasone propionate 50 mcg/actuation nasal spray  Commonly known as: FLONASE  Spray 2 sprays into each nostril Two (2) times a day.     NEILMED SINUS RINSE COMPLETE nasal packet  Generic drug: sod chlor-bicarb-squeez bottle  1 packet into each nostril two (2) times a day.     prednisoLONE acetate 1 % ophthalmic suspension  Commonly known as: PRED FORTE  Administer 2 drops to both eyes four (4) times a day for 6 days.            CONTINUE taking these medications      acetaminophen 650 MG CR tablet  Commonly known as: TYLENOL 8 HOUR  Take 1 tablet (650 mg total) by mouth every eight (8) hours as needed for pain.     albuterol 90 mcg/actuation inhaler  Commonly known as: PROVENTIL HFA;VENTOLIN HFA  Inhale 2 puffs every six (6) hours as needed for wheezing.     cetirizine 10 MG tablet  Commonly known as: ZyrTEC  Take 1 tablet (10 mg total) by mouth nightly.     dapsone 100 MG tablet  Take 1 tablet (100 mg total) by mouth daily.     fluconazole 200 MG tablet  Commonly known as: DIFLUCAN  Take 1 tablet (200 mg total) by mouth daily.     levalbuterol 0.31 mg/3 mL nebulizer solution  Commonly known as: XOPENEX  Inhale 3 mL (0.31 mg total) by nebulization every four (4) hours as needed for wheezing (cough).     levoFLOXacin 500 MG tablet  Commonly known as: LEVAQUIN  Take 1 tablet (500 mg total) by mouth daily.     NON FORMULARY PATIENT SUPPLIED MED  Apply 1 application. topically Three (3) times a day as needed. Doctor Butler's Cream [Amazon]     prochlorperazine 10 MG tablet  Commonly known as: COMPAZINE  Take 1 tablet (10 mg total) by mouth every six (6) hours as needed for up to 14 days.     SpryceL 70 MG tablet  Generic drug: dasatinib  Take 1 tablet (70 mg total) by mouth daily.     valACYclovir 500 MG tablet  Commonly known as: VALTREX  Take 1 tablet (500 mg total) by mouth daily.              Allergies:  Erythromycin, Other, Sulfa (sulfonamide antibiotics), and Azithromycin  ______________________________________________________________________  Pending Test Results (if blank, then none):      Most Recent Labs:  All lab results last 24 hours -   Recent Results (from the past 24 hour(s))   Basic Metabolic Panel    Collection Time: 02/21/22  3:07 AM   Result Value Ref Range    Sodium 143 135 - 145 mmol/L    Potassium 4.0 3.4 - 4.8 mmol/L    Chloride 108 (H) 98 - 107 mmol/L    CO2 29.0 20.0 - 31.0 mmol/L    Anion Gap 6 5 - 14 mmol/L    BUN 19 9 - 23 mg/dL    Creatinine 7.51 0.25 - 0.80 mg/dL    BUN/Creatinine Ratio 32     eGFR CKD-EPI (2021) Female >90 >=60 mL/min/1.57m2    Glucose 114 70 - 179 mg/dL    Calcium 8.4 (L) 8.7 - 10.4 mg/dL   CBC w/ Differential    Collection Time: 02/21/22  3:07 AM   Result Value Ref Range    WBC 3.1 (L) 3.6 - 11.2 10*9/L    RBC 1.97 (L) 3.95 - 5.13 10*12/L    HGB 6.6 (L) 11.3 - 14.9 g/dL    HCT 85.2 (L) 77.8 - 44.0 %    MCV 96.6 (H) 77.6 - 95.7 fL    MCH 33.7 (H) 25.9 - 32.4 pg    MCHC 34.9 32.0 - 36.0 g/dL    RDW 24.2 (H) 35.3 - 15.2 %    MPV 6.5 (L) 6.8 - 10.7 fL    Platelet 178 150 - 450 10*9/L    Neutrophils % 97.7 %    Lymphocytes % 2.0 %    Monocytes % 0.2 %    Eosinophils % 0.0 %    Basophils % 0.1 %    Absolute Neutrophils 3.0 1.8 - 7.8 10*9/L    Absolute Lymphocytes 0.1 (L) 1.1 - 3.6 10*9/L    Absolute Monocytes 0.0 (L) 0.3 - 0.8 10*9/L    Absolute Eosinophils 0.0 0.0 - 0.5 10*9/L    Absolute Basophils 0.0 0.0 - 0.1 10*9/L    Anisocytosis Marked (A) Not Present   Type and Screen    Collection Time: 02/21/22  6:21 AM   Result Value Ref Range    ABO Grouping O POS     Antibody Screen NEG    Urinalysis with Microscopy    Collection Time: 02/21/22  7:15 AM   Result Value Ref Range    Color, UA Yellow     Clarity, UA Clear     Specific Gravity, UA 1.020 1.003 - 1.030    pH, UA 7.0 5.0 - 9.0    Leukocyte  Esterase, UA Negative Negative    Nitrite, UA Negative Negative    Protein, UA Negative Negative    Glucose, UA Negative Negative    Ketones, UA Negative Negative    Urobilinogen, UA <2.0 mg/dL <1.6 mg/dL    Bilirubin, UA Negative Negative    Blood, UA Negative Negative    RBC, UA <1 <=4 /HPF    WBC, UA <1 0 - 5 /HPF    Squam Epithel, UA <1 0 - 5 /HPF    Bacteria, UA None Seen None Seen /HPF   Prepare RBC    Collection Time: 02/21/22  8:25 AM   Result Value Ref Range    Crossmatch Compatible     Unit Blood Type O Pos     ISBT Number 5100     Unit # [id number]     Status Issued     Spec Expiration [id number]     Product ID Red Blood Cells     PRODUCT CODE [id number]    Prepare RBC    Collection Time: 02/21/22 11:57 AM   Result Value Ref Range    Crossmatch Compatible     Unit Blood Type O Pos ISBT Number 5100     Unit # [id number]     Status Issued     Spec Expiration [id number]     Product ID Red Blood Cells     PRODUCT CODE [id number]        Relevant Studies/Radiology (if blank, then none):  XR Chest Portable    Result Date: 02/17/2022  EXAM: XR CHEST PORTABLE DATE: 02/17/2022 8:12 AM ACCESSION: [id number] UN DICTATED: 02/17/2022 8:51 AM INTERPRETATION LOCATION: [facility] CLINICAL INDICATION: 46 years old Female with methotrexate ; OTHER  - C91.00 - B - cell acute lymphoblastic leukemia (ALL) (CMS - HCC)  TECHNIQUE: Single View AP Chest Radiograph. COMPARISON: Chest radiograph 01/13/2022 FINDINGS: Unchanged right chest wall Port-A-Cath with tip overlying the superior cavoatrial junction. Lungs are clear. No pleural effusion or pneumothorax. Stable cardiomediastinal silhouette.     No acute airspace disease or accumulation of pleural fluid per upright portable imaging. Consider a standard PA and lateral study if there is ongoing concern for a small pleural effusion.    CT Sinus Wo Contrast    Result Date: 02/18/2022  EXAM: Computed tomography, sinus without contrast material. DATE: 02/17/2022 10:05 PM ACCESSION: [id number] UN DICTATED: 02/17/2022 11:28 PM INTERPRETATION LOCATION: [facility] CLINICAL INDICATION: 46 years old Female with Recurrent sinus infection  COMPARISON: None TECHNIQUE: Axial CT images through the paranasal sinuses without contrast. Coronal and sagittal reformatted images are provided. FINDINGS:  Frontal sinuses are clear. Frontal recesses are patent. Ethmoid air cells are clear. Minimal mucosal thickening of the right sphenoid sinus (5:56). Left sphenoid sinus is clear. Sphenoethmoidal recesses are patent. Minimal mucosal thickening of the right maxillary sinus (4:40). Left maxillary sinus is clear. Ostiomeatal units are patent. No significant nasal septal deviation or nasal spur. No skull base dehiscence.     Minimal mucosal thickening of the right maxillary and right sphenoid sinuses. Paranasal sinuses are otherwise clear.    FL Lumbar Puncture With Chemo    Result Date: 02/18/2022  EXAM: Intrathecal chemotherapy, injection under fluoroscopic guidance.  Imaging supervision and interpretation. DATE: 02/18/2022 ACCESSION: [id number] UN DICTATED: 02/18/2022 11:41 AM INTERPRETATION LOCATION: [name] CLINICAL INDICATION: 46 years old Female with IT chemo. B-cell acute lymphoblastic leukemia. COMPARISON: Fluoroscopic lumbar puncture 01/11/2022. FLUOROSCOPY TIME: 51 seconds CONSENT: The potential risks and benefits of the procedure were discussed and  all questions were answered and written and verbal consent was obtained and documented by  Trula Ore MD and Christoper Fabian MD.  TIME OUT: A time out was performed to verify patient, procedure, and site. During the patient time out, name and DOB patient identifiers are checked verbally (if possible) and also with the patient's wrist band prior to the procedure PROCEDURE: The patient was placed in a prone oblique position on the fluoroscopy table, and the lower back was prepped and draped in the usual sterile fashion.  Local anesthesia was provided at the selected puncture site using 1% lidocaine.  Using sterile technique and intermittent fluoroscopic guidance, a 20-gauge, 9 cm spinal needle was introduced into the thecal sac at L2-3 using a sublaminar approach.  There was spontaneous return of clear CSF.  Only 2 mL of CSF was collected due to slow flow and difficulty of patient tolerating procedure.  The specimen was sent to the lab. Approximately 6 mL of cytarabine was instilled over 3-5 minutes by  Nakul Patel/Fernando Juline Patch, MD.  The stylet was replaced, and the needle was removed. A sterile bandage was placed at the puncture site.  The patient was instructed to lie supine for at least one hour.  The patient tolerated the procedure without immediate complications. ATTESTATION: Gardiner Sleeper MD was present for the critical portions, and immediately available for the remainder of this procedure.     Lumbar puncture using fluoroscopic guidance with intrathecal chemotherapy.   ______________________________________________________________________    Activity Instructions       Activity as tolerated              Diet Instructions       Discharge diet (specify)      Discharge Nutrition Therapy: Regular            Other Instructions       Call MD for:      Fever greater than 100.27F    Call MD for:  difficulty breathing, headache or visual disturbances      Call MD for:  extreme fatigue      Call MD for:  hives      Call MD for:  persistent dizziness or light-headedness      Call MD for:  persistent nausea or vomiting      Call MD for:  redness, tenderness, or signs of infection (pain, swelling, redness, odor or green/yellow discharge around incision site)      Call MD for:  severe uncontrolled pain      Call MD for:  vaginal bleeding saturating more than 1 pad per hour.      Discharge instructions      New/Important Medications:  - Valtrex - helps to prevent shingles  - Levaquin- Helps prevent bacterial infections  -Fluconazole- helps prevent fungal infections  -Dapsone- To prevent PJP pneumonia  - Prednisolone eye drops- To protect you eyes from Cytarabine   -Flonase-For treatment of your symptoms of sinus irritation  -NeilMed Sinus Rinse Complete- For treatment of your symptoms of sinus irritation  -Pepto Bismol- for your upset stomach     Follow up Appointments:   - Rituximab injection on 7/25 at [facility] with transfusion support 3 times weekly at 8/1 starting on [facility]  - Dr. Senaida Ores on 8/10 at [name]     Diagnosis: ALL    Course complications:  -  None      When to Call Your [name] Team:   Monday- Friday  from 8:00 am - 5:00 pm      Call [phone]      Or Toll free [phone]      Ask to speak to the Triage Nurse  Triage Nurse will Direct your call to Hematology/Oncology Fellow On-Call.     For Spanish speakers please call [phone]  you will be connected with your team and an Interpreter     RED ZONE:  Take action now!  You need to be seen right away. Call 911 or go to your nearest hospital for help.   - Symptoms are at a severe level of discomfort    - Bleeding that will not stop  - Chest Pain    - Hard to breathe    - Fall or passing out    - New Seizure    - Thoughts of hurting yourself or others     YELLOW ZONE: Take action today  This is NOT an all-inclusive list. Pleae call with any new or worsening symptoms.   Call your doctor, nurse or other healthcare provider at [phone]  You can be seen by a provider the same day through our Same Day Acute Care for Patients with Cancer program.   - Symptoms are new or worsening; You are not within your goal range for:    - Pain          - Swelling (leg, arm, abdomen, face, neck)    - Shortness of breath       - Skin rash or skin changes    - Bleeding (nose, urine, stool, wound)    - Wound issues (redness, drainage, re-opened)    - Feeling sick to your stomach and throwing up    - Confusion    - Mouth sores/pain in your mouth or throat    - Vision changes   - Hard stool or very loose stools (increase in ostomy   - Fever >100.4 F, chills   Output)        - Worsening cough with mucus that is green, yellow or bloody   - No urine for 12 hours      - Pain or burning when going to the bathroom    - Feeding tube or other catheter/tube issue    - Home infusion pump issue - call [phone]   - Redness or pain at previous IV or port/catheter site    - Depressed or anxiety     GREEN ZONE: You are in control   Your symptoms are under control. Continue to take your medicine as ordered. Keep all visits to the doctor.   - No increase or worsening symptoms   - Able to take your medicine   - Able to drink and eat     For your safety and best care, please DO NOT use MyChart messages to report red or yellow symptoms.   MyChart messages are only checked during weekday normal business hours and you should receive a   Response within 2 business days.   Please use MyChart only for the follows:   - Non-urgent medication refills, scheduling requests or general questions.           Patient Education:     Your blood counts will continue to drop, so it is good to adhere to the following precautions:    - Wash your hands routinely with soap and water  - Take your temperature when you have chills or are not feeling well  - Use a  soft toothbrush  - Avoid constipation or straining with bowel movements. This may mean you occasionally need to take over-the-counter stool softeners or laxatives.   - Avoid people who have colds or the flu, or are not feeling well.  - Wear a mask when visiting crowded places.  - Avoid anyone who has received a live vaccination (shot) within the last three weeks  - Maintain a well-balanced diet and eat healthy foods  - Speak with your doctor before having any dental work done  - Limit exposure to pet feces (e.g., litter box)  - Do only as much activity as you can tolerate    Other instructions:  - Don't use dental floss if your platelet count is below 50,000. Your doctor or nurse should tell you if this is the case.  - Use any mouthwashes given to you as directed.  - If you can't tolerate regular brushing, use an oral swab (bristle-less) toothbrush, or use salt and baking soda to clean your mouth. Mix 1 teaspoon of salt and 1 teaspoon of baking soda into an 8-ounce glass of warm water. Swish and spit.  - Watch your mouth and tongue for white patches. This is a sign of fungal infection, a common side effect of chemotherapy. Be sure to tell your doctor about these patches. Medication/mouthwashes can be prescribed to help you fight the fungal infection.      COVID-19 is a new challenge, but [place] and the [facility] is dedicated to providing you and your loved ones with the best possible cancer care and support in the safest way possible during this time. We made two videos about the ways we are working to keep you safe, such as offering the option to visit your care team over the phone or through a video, as well as support services offered for our patients and their caregivers. If you have any questions about your cancer care, please call your care team.     Video #1: Keeping [name] patients safe during the COVID-19 crisis  [email or web address]     Video #2: Support for cancer patients and their caregivers during the COVID-19 pandemic  [email or web address].uy            Follow Up instructions and Outpatient Referrals     CBC w/ Differential      CBC w/ Differential      Call MD for:      Call MD for:  difficulty breathing, headache or visual disturbances      Call MD for:  extreme fatigue      Call MD for:  hives      Call MD for:  persistent dizziness or light-headedness      Call MD for:  persistent nausea or vomiting      Call MD for:  redness, tenderness, or signs of infection (pain, swelling,   redness, odor or green/yellow discharge around incision site)      Call MD for:  severe uncontrolled pain      Call MD for:  vaginal bleeding saturating more than 1 pad per hour.      Comprehensive Metabolic Panel      Is this a fasting order?: No     CMP contains the following tests: NA, K, CL, CO2, BUN, CR, GLUC, CA,   Albumin, Total Protein, Total Bilirubin, AST, ALT, and Alkaline   Phosphatase.     Discharge instructions      Morphology Review  Appointments which have been scheduled for you      Feb 22, 2022  8:00 AM  (Arrive by 7:30 AM)  LEVEL 180 with [employer] CHAIR 18  [place] [facility]COLOGY INF[facility]ION [name]) [address]  [place] [address]  [phone]        Feb 25, 2022  2:15 PM  (Arrive by 1:45 PM)  BLOOD TRANSFUSION - 2 UNITS with [employer] CHAIR 38  [place] ONCOLOGY INFUSION [facility] REGION) [address]  [place] [address]  [phone] Mar 01, 2022 10:45 AM  (Arrive by 10:15 AM)  BLOOD TRANSFUSION - 2 UNITS with [employer] CHAIR 44  [place] ONCOLOGY INFUSION [name]) [address]  [place] [address]  [phone]        Mar 04, 2022  1:15 PM  (Arrive by 12:45 PM)  BLOOD TRANSFUSION - 2 UNITS with [employer] CHAIR 43  [place] ONCOLOGY INFUSION [facility] REGION) [address]  [place] [address]  [phone]        Mar 08, 2022 12:00 PM  (Arrive by 11:30 AM)  BLOOD TRANSFUSION - 2 UNITS with [employer] CHAIR 25  [place] [facility]COLOGY INFUSION [name]) [address]  [place] [address]  [phone]        Mar 10, 2022  1:00 PM  (Arrive by 12:30 PM)  LAB ONLY [place] with ADULT ONC LAB  [name]ULT [facility]COLOGY LAB DRAW STATION [name]) [address]  [place] [address]  [phone]        Mar 10, 2022  2:00 PM  (Arrive by 1:30 PM)  RETURN ACTIVE [place] with Patricia Lew, MD  [name]Y 2ND [name]) [address]  [place] [address]  [phone]        Mar 11, 2022 12:30 PM  (Arrive by 12:00 PM)  BLOOD TRANSFUSION - 2 UNITS with ONCINF CHAIR 21   ONCOLOGY INFUSION [name]) [address]  [place] [address]  [phone]             ______________________________________________________________________  Discharge Day Services:  BP 126/84  - Pulse 92  - Temp 37.2 ??C (99 ??F) (Oral)  - Resp 15  - Ht 169 cm (5' 6.54)  - Wt 84.7 kg (186 lb 11.7 oz)  - SpO2 95%  - BMI 29.66 kg/m??   Pt seen on the day of discharge and determined appropriate for discharge.    Condition at Discharge: good    Length of Discharge: I spent greater than 30 mins in the discharge of this patient.    Vena Rua. Oliva Bustard  Nurse Practitioner  Hematology/Oncology  [name]    Group pager: [phone]

## 2022-02-21 NOTE — Unmapped (Signed)
Problem: Adult Inpatient Plan of Care  Goal: Plan of Care Review  Outcome: Ongoing - Unchanged  Goal: Patient-Specific Goal (Individualized)  Outcome: Ongoing - Unchanged  Goal: Absence of Hospital-Acquired Illness or Injury  Outcome: Ongoing - Unchanged  Intervention: Identify and Manage Fall Risk  Recent Flowsheet Documentation  Taken 02/20/2022 0700 by Idell Pickles, RN  Safety Interventions:   bleeding precautions   fall reduction program maintained   lighting adjusted for tasks/safety   low bed   chemotherapeutic agent precautions   nonskid shoes/slippers when out of bed  Intervention: Prevent Infection  Recent Flowsheet Documentation  Taken 02/20/2022 0700 by Idell Pickles, RN  Infection Prevention: hand hygiene promoted  Goal: Optimal Comfort and Wellbeing  Outcome: Ongoing - Unchanged  Goal: Readiness for Transition of Care  Outcome: Ongoing - Unchanged  Goal: Rounds/Family Conference  Outcome: Ongoing - Unchanged

## 2022-02-21 NOTE — Unmapped (Signed)
OTO Pepto administered for GI upset. Cytarabine administered without complications.      Problem: Adult Inpatient Plan of Care  Goal: Plan of Care Review  Outcome: Ongoing - Unchanged  Goal: Patient-Specific Goal (Individualized)  Outcome: Ongoing - Unchanged  Goal: Absence of Hospital-Acquired Illness or Injury  Outcome: Ongoing - Unchanged  Intervention: Identify and Manage Fall Risk  Recent Flowsheet Documentation  Taken 02/20/2022 1930 by Eusebio Me, RN  Safety Interventions:   bleeding precautions   chemotherapeutic agent precautions   environmental modification   infection management   isolation precautions   lighting adjusted for tasks/safety   low bed   neutropenic precautions   nonskid shoes/slippers when out of bed  Intervention: Prevent and Manage VTE (Venous Thromboembolism) Risk  Recent Flowsheet Documentation  Taken 02/20/2022 2000 by Eusebio Me, RN  VTE Prevention/Management: fluids promoted  Taken 02/20/2022 1930 by Eusebio Me, RN  Activity Management: activity adjusted per tolerance  Range of Motion: Bilateral Upper and Lower Extremities  Intervention: Prevent Infection  Recent Flowsheet Documentation  Taken 02/20/2022 1930 by Eusebio Me, RN  Infection Prevention:   cohorting utilized   environmental surveillance performed   equipment surfaces disinfected   hand hygiene promoted   personal protective equipment utilized   rest/sleep promoted   single patient room provided   visitors restricted/screened  Goal: Optimal Comfort and Wellbeing  Outcome: Ongoing - Unchanged  Goal: Readiness for Transition of Care  Outcome: Ongoing - Unchanged  Goal: Rounds/Family Conference  Outcome: Ongoing - Unchanged

## 2022-02-22 ENCOUNTER — Ambulatory Visit: Admit: 2022-02-22 | Discharge: 2022-02-23 | Payer: PRIVATE HEALTH INSURANCE

## 2022-02-22 MED ADMIN — heparin, porcine (PF) 100 unit/mL injection 500 Units: 500 [IU] | INTRAVENOUS | @ 16:00:00 | Stop: 2022-02-23

## 2022-02-22 MED ADMIN — diphenhydrAMINE (BENADRYL) capsule/tablet 25 mg: 25 mg | ORAL | @ 13:00:00 | Stop: 2022-02-22

## 2022-02-22 MED ADMIN — acetaminophen (TYLENOL) tablet 650 mg: 650 mg | ORAL | @ 13:00:00 | Stop: 2022-02-22

## 2022-02-22 MED ADMIN — riTUXimab-abbs (TRUXIMA) 731 mg in sodium chloride (NS) 0.9 % 250 mL rapid infusion: 375 mg/m2 | INTRAVENOUS | @ 14:00:00 | Stop: 2022-02-22

## 2022-02-22 NOTE — Unmapped (Signed)
Encounter addended by: Doris Cheadle, RN on: 02/22/2022 11:53 AM   Actions taken: Flowsheet accepted

## 2022-02-22 NOTE — Unmapped (Signed)
No visits with results within 1 Day(s) from this visit.   Latest known visit with results is:   No results displayed because visit has over 200 results.

## 2022-02-22 NOTE — Unmapped (Signed)
Pt arrived to bed 11. No complaints noted. Port accessed with positive blood return. Tolerated tx well. AVS declined and discharged to home.

## 2022-02-24 DIAGNOSIS — C9100 Acute lymphoblastic leukemia not having achieved remission: Principal | ICD-10-CM

## 2022-02-25 ENCOUNTER — Ambulatory Visit
Admit: 2022-02-25 | Discharge: 2022-03-09 | Disposition: A | Discharge status: Home / Self Care | Payer: PRIVATE HEALTH INSURANCE | Admitting: Medical Oncology

## 2022-02-25 ENCOUNTER — Encounter: Admit: 2022-02-25 | Payer: PRIVATE HEALTH INSURANCE | Attending: Hematology & Oncology

## 2022-02-25 ENCOUNTER — Encounter: Admit: 2022-02-25 | Payer: PRIVATE HEALTH INSURANCE

## 2022-02-25 ENCOUNTER — Encounter
Admit: 2022-02-25 | Discharge: 2022-03-09 | Disposition: A | Discharge status: Home / Self Care | Payer: PRIVATE HEALTH INSURANCE | Attending: Hematology & Oncology | Admitting: Medical Oncology

## 2022-02-25 ENCOUNTER — Encounter: Admit: 2022-02-25 | Discharge: 2022-03-09 | Payer: PRIVATE HEALTH INSURANCE | Attending: Hematology & Oncology

## 2022-02-25 ENCOUNTER — Ambulatory Visit: Admit: 2022-02-25 | Payer: PRIVATE HEALTH INSURANCE

## 2022-02-25 ENCOUNTER — Ambulatory Visit: Admit: 2022-02-25 | Discharge: 2022-03-09 | Payer: PRIVATE HEALTH INSURANCE

## 2022-02-25 LAB — SLIDE REVIEW

## 2022-02-25 LAB — COMPREHENSIVE METABOLIC PANEL
ALBUMIN: 3.8 g/dL (ref 3.4–5.0)
ALKALINE PHOSPHATASE: 68 U/L (ref 46–116)
ALT (SGPT): 93 U/L — ABNORMAL HIGH (ref 10–49)
ANION GAP: 7 mmol/L (ref 5–14)
AST (SGOT): 31 U/L (ref <=34)
BILIRUBIN TOTAL: 1 mg/dL (ref 0.3–1.2)
BLOOD UREA NITROGEN: 14 mg/dL (ref 9–23)
BUN / CREAT RATIO: 32
CALCIUM: 9 mg/dL (ref 8.7–10.4)
CHLORIDE: 105 mmol/L (ref 98–107)
CO2: 26 mmol/L (ref 20.0–31.0)
CREATININE: 0.44 mg/dL — ABNORMAL LOW
EGFR CKD-EPI (2021) FEMALE: >90 mL/min/{1.73_m2} (ref >=60)
GLUCOSE RANDOM: 103 mg/dL (ref 70–179)
POTASSIUM: 4.1 mmol/L (ref 3.4–4.8)
PROTEIN TOTAL: 6.1 g/dL (ref 5.7–8.2)
SODIUM: 138 mmol/L (ref 135–145)

## 2022-02-25 LAB — CBC W/ AUTO DIFF
BASOPHILS ABSOLUTE COUNT: 0 10*9/L (ref 0.0–0.1)
BASOPHILS RELATIVE PERCENT: 0.4 %
EOSINOPHILS ABSOLUTE COUNT: 0 10*9/L (ref 0.0–0.5)
EOSINOPHILS RELATIVE PERCENT: 7 %
HEMATOCRIT: 26.7 % — ABNORMAL LOW (ref 34.0–44.0)
HEMOGLOBIN: 9.4 g/dL — ABNORMAL LOW (ref 11.3–14.9)
LYMPHOCYTES ABSOLUTE COUNT: 0.1 10*9/L — ABNORMAL LOW (ref 1.1–3.6)
LYMPHOCYTES RELATIVE PERCENT: 23.1 %
MEAN CORPUSCULAR HEMOGLOBIN CONC: 35.4 g/dL (ref 32.0–36.0)
MEAN CORPUSCULAR HEMOGLOBIN: 32.4 pg (ref 25.9–32.4)
MEAN CORPUSCULAR VOLUME: 91.6 fL (ref 77.6–95.7)
MEAN PLATELET VOLUME: 5.7 fL — ABNORMAL LOW (ref 6.8–10.7)
MONOCYTES ABSOLUTE COUNT: 0 10*9/L — ABNORMAL LOW (ref 0.3–0.8)
MONOCYTES RELATIVE PERCENT: 0.7 %
NEUTROPHILS ABSOLUTE COUNT: 0.2 10*9/L — CL (ref 1.8–7.8)
NEUTROPHILS RELATIVE PERCENT: 68.8 %
PLATELET COUNT: 68 10*9/L — ABNORMAL LOW (ref 150–450)
RED BLOOD CELL COUNT: 2.91 10*12/L — ABNORMAL LOW (ref 3.95–5.13)
RED CELL DISTRIBUTION WIDTH: 20.7 % — ABNORMAL HIGH (ref 12.2–15.2)
WBC ADJUSTED: 0.3 10*9/L — CL (ref 3.6–11.2)

## 2022-02-25 LAB — URINALYSIS WITH MICROSCOPY
BACTERIA: NONE SEEN /HPF
BILIRUBIN UA: NEGATIVE
BLOOD UA: NEGATIVE
GLUCOSE UA: NEGATIVE
KETONES UA: NEGATIVE
LEUKOCYTE ESTERASE UA: NEGATIVE
NITRITE UA: NEGATIVE
PH UA: 7 (ref 5.0–9.0)
RBC UA: 1 /HPF (ref <=4)
SPECIFIC GRAVITY UA: 1.017 (ref 1.003–1.030)
SQUAMOUS EPITHELIAL: <1 /HPF (ref 0–5)
UROBILINOGEN UA: <2
WBC UA: 2 /HPF (ref 0–5)

## 2022-02-25 LAB — MAGNESIUM: MAGNESIUM: 1.8 mg/dL (ref 1.6–2.6)

## 2022-02-25 MED ADMIN — vancomycin (VANCOCIN) 1750 mg in sodium chloride (NS) 0.9 % 500 mL IVPB (premix): 1750 mg | INTRAVENOUS | @ 20:00:00 | Stop: 2022-02-25

## 2022-02-25 MED ADMIN — cefepime (MAXIPIME) 2 g in sodium chloride 0.9 % (NS) 100 mL IVPB-MBP: 2 g | INTRAVENOUS | @ 23:00:00 | Stop: 2022-02-25

## 2022-02-25 MED ADMIN — acetaminophen (TYLENOL 8 HOUR) CR tablet 650 mg: 650 mg | ORAL | @ 23:00:00 | Stop: 2022-02-25

## 2022-02-26 LAB — CBC W/ AUTO DIFF
BASOPHILS ABSOLUTE COUNT: 0 10*9/L (ref 0.0–0.1)
BASOPHILS RELATIVE PERCENT: 0.6 %
EOSINOPHILS ABSOLUTE COUNT: 0 10*9/L (ref 0.0–0.5)
EOSINOPHILS RELATIVE PERCENT: 9.6 %
HEMATOCRIT: 22 % — ABNORMAL LOW (ref 34.0–44.0)
HEMOGLOBIN: 7.8 g/dL — ABNORMAL LOW (ref 11.3–14.9)
LYMPHOCYTES ABSOLUTE COUNT: 0.1 10*9/L — ABNORMAL LOW (ref 1.1–3.6)
LYMPHOCYTES RELATIVE PERCENT: 43.3 %
MEAN CORPUSCULAR HEMOGLOBIN CONC: 35.5 g/dL (ref 32.0–36.0)
MEAN CORPUSCULAR HEMOGLOBIN: 32.9 pg — ABNORMAL HIGH (ref 25.9–32.4)
MEAN CORPUSCULAR VOLUME: 92.8 fL (ref 77.6–95.7)
MEAN PLATELET VOLUME: 5.8 fL — ABNORMAL LOW (ref 6.8–10.7)
MONOCYTES ABSOLUTE COUNT: 0 10*9/L — ABNORMAL LOW (ref 0.3–0.8)
MONOCYTES RELATIVE PERCENT: 1.1 %
NEUTROPHILS ABSOLUTE COUNT: 0.1 10*9/L — CL (ref 1.8–7.8)
NEUTROPHILS RELATIVE PERCENT: 45.4 %
PLATELET COUNT: 58 10*9/L — ABNORMAL LOW (ref 150–450)
RED BLOOD CELL COUNT: 2.37 10*12/L — ABNORMAL LOW (ref 3.95–5.13)
RED CELL DISTRIBUTION WIDTH: 20.7 % — ABNORMAL HIGH (ref 12.2–15.2)
WBC ADJUSTED: 0.1 10*9/L — CL (ref 3.6–11.2)

## 2022-02-26 LAB — PHOSPHORUS: PHOSPHORUS: 4.2 mg/dL (ref 2.4–5.1)

## 2022-02-26 LAB — BASIC METABOLIC PANEL
ANION GAP: 8 mmol/L (ref 5–14)
BLOOD UREA NITROGEN: 15 mg/dL (ref 9–23)
BUN / CREAT RATIO: 26
CALCIUM: 8.7 mg/dL (ref 8.7–10.4)
CHLORIDE: 107 mmol/L (ref 98–107)
CO2: 24 mmol/L (ref 20.0–31.0)
CREATININE: 0.57 mg/dL — ABNORMAL LOW
EGFR CKD-EPI (2021) FEMALE: >90 mL/min/{1.73_m2} (ref >=60)
GLUCOSE RANDOM: 119 mg/dL (ref 70–179)
POTASSIUM: 3.6 mmol/L (ref 3.4–4.8)
SODIUM: 139 mmol/L (ref 135–145)

## 2022-02-26 LAB — HEPATIC FUNCTION PANEL
ALBUMIN: 3.5 g/dL (ref 3.4–5.0)
ALKALINE PHOSPHATASE: 62 U/L (ref 46–116)
ALT (SGPT): 84 U/L — ABNORMAL HIGH (ref 10–49)
AST (SGOT): 29 U/L (ref <=34)
BILIRUBIN DIRECT: 0.2 mg/dL (ref 0.00–0.30)
BILIRUBIN TOTAL: 0.7 mg/dL (ref 0.3–1.2)
PROTEIN TOTAL: 5.7 g/dL (ref 5.7–8.2)

## 2022-02-26 LAB — LACTATE DEHYDROGENASE: LACTATE DEHYDROGENASE: 374 U/L — ABNORMAL HIGH (ref 120–246)

## 2022-02-26 LAB — MAGNESIUM: MAGNESIUM: 1.7 mg/dL (ref 1.6–2.6)

## 2022-02-26 MED ADMIN — prednisoLONE acetate (PRED FORTE) 1 % ophthalmic suspension 2 drop: 2 [drp] | OPHTHALMIC | @ 02:00:00

## 2022-02-26 MED ADMIN — cefepime (MAXIPIME) 2 g in sodium chloride 0.9 % (NS) 100 mL IVPB-MBP: 2 g | INTRAVENOUS | Stop: 2022-03-05

## 2022-02-26 MED ADMIN — cetirizine (ZyrTEC) tablet 10 mg: 10 mg | ORAL | @ 13:00:00

## 2022-02-26 MED ADMIN — iohexoL (OMNIPAQUE) 350 mg iodine/mL solution 75 mL: 75 mL | INTRAVENOUS | @ 16:00:00 | Stop: 2022-02-26

## 2022-02-26 MED ADMIN — cefepime (MAXIPIME) 2 g in sodium chloride 0.9 % (NS) 100 mL IVPB-MBP: 2 g | INTRAVENOUS | @ 08:00:00 | Stop: 2022-03-05

## 2022-02-26 MED ADMIN — dasatinib (SPRYCEL) tablet 70 mg: 70 mg | ORAL | @ 16:00:00

## 2022-02-26 MED ADMIN — dapsone tablet 100 mg: 100 mg | ORAL | @ 02:00:00

## 2022-02-26 MED ADMIN — prednisoLONE acetate (PRED FORTE) 1 % ophthalmic suspension 2 drop: 2 [drp] | OPHTHALMIC | @ 11:00:00 | Stop: 2022-02-27

## 2022-02-26 MED ADMIN — albuterol (PROVENTIL HFA;VENTOLIN HFA) 90 mcg/actuation inhaler 2 puff: 2 | RESPIRATORY_TRACT | @ 21:00:00

## 2022-02-26 MED ADMIN — sodium chloride (NS) 0.9 % flush 10 mL: 10 mL | INTRAVENOUS | @ 02:00:00

## 2022-02-26 MED ADMIN — fluconazole (DIFLUCAN) tablet 200 mg: 200 mg | ORAL | @ 02:00:00

## 2022-02-26 MED ADMIN — sodium chloride (NS) 0.9 % flush 10 mL: 10 mL | INTRAVENOUS | @ 13:00:00 | Stop: 2022-02-26

## 2022-02-26 MED ADMIN — prednisoLONE acetate (PRED FORTE) 1 % ophthalmic suspension 2 drop: 2 [drp] | OPHTHALMIC | @ 16:00:00 | Stop: 2022-02-27

## 2022-02-26 MED ADMIN — cefepime (MAXIPIME) 2 g in sodium chloride 0.9 % (NS) 100 mL IVPB-MBP: 2 g | INTRAVENOUS | @ 16:00:00 | Stop: 2022-03-05

## 2022-02-26 MED ADMIN — prednisoLONE acetate (PRED FORTE) 1 % ophthalmic suspension 2 drop: 2 [drp] | OPHTHALMIC | @ 22:00:00 | Stop: 2022-02-27

## 2022-02-26 MED ADMIN — valACYclovir (VALTREX) tablet 500 mg: 500 mg | ORAL | @ 13:00:00

## 2022-02-26 MED ADMIN — vancomycin (VANCOCIN) 1500 mg in sodium chloride (NS) 0.9 % 500 mL IVPB (premix): 1500 mg | INTRAVENOUS | @ 11:00:00 | Stop: 2022-02-26

## 2022-02-26 NOTE — Unmapped (Signed)
Hematology Resident (MEDE) Progress Note    Assessment & Plan:   Patricia [name] is an 46 y.o. 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     Active Problems:    B-cell acute lymphoblastic leukemia (ALL) (CMS-HCC)    Acute cough    Pancytopenia (CMS-HCC)    Febrile neutropenia (CMS-HCC)  Resolved Problems:    * No resolved hospital problems. *      Active Problems    Febrile Neutropenia  Febrile to 38.7 on arrival to infusion appointment 7/28.  Worsening cough and increased fatigue starting day prior to admission.  Recently discharged on 7/24 after starting C4 of HCVAD. Of note, she had a similar presentation after completing C2 and was diagnosed with LLL PNA at that time. No dyspnea or hypoxia at this time. Suspect a respiratory etiology is the most likely cause of the fever but can consider GI source as well given history of loose stools prior to admission. RPP negative. PJP less likely as she is on prophylactic dapsone.  - Vancomycin and cefepime for neutropenic fever with possible respiratory etiology  - MRSA nares screen  - CT chest w contrast today  - LRCx pending  - GIPP and C. Diff pending  - continue home ppx regimen as below     Ph+ALL in remission  Initially diagnosed in 09/2021. Please see H&P for full oncologic history. Started C4B of R-HyperCVAD/MA+dasatinib on 7/19 with IT cytarabine. Receiving IT once per cycle (methotrexate on odd cycles, cytarabine on even cycles). Post C3 Bmbx demonstrated no molecular evidence of disease. Next Bmbx scheduled for after cycle 8.  - continue dasatinib while admitted  - daily CBC w diff  - ppx: fluconazole, valtrex, and dapsone  - continue prednisolone eye drops      Immunocompromised status: Patient is immunocompromised secondary to ALL disease or chemotherapy  -Antimicrobial prophylaxis as above     Impending Electrolyte Abnormality Secondary to Chemotherapy and/or IV Fluids  -Daily Electrolyte monitoring  -Replete per [facility] guidelines.       Daily Checklist:  Diet: Regular Diet  DVT PPx: Contradindicated - Thrombocytopenic  Electrolytes: Replete Potassium to >/= 3.6 and Magnesium to >/= 1.8  Code Status: Full Code  Dispo: Admit to Med E    Team Contact Information:   Primary Team: Hematology Resident (MEDE)  Primary Resident: Marda Stalker, MD  Resident's Pager: [phone] (Hematology Intern - Tower)    Interval History:   No acute events overnight.    Patient feels slightly better this morning now that fever has resolved. Reports her cough is slightly worse than yesterday. No sputum production or dyspnea at this time. She is still having a lot of pain from her hemorrhoids. Does endorse bright red blood per rectum after bowel movements but it has been minimal and is stable from prior.     All other systems were reviewed and are negative except as noted in the HPI    Objective:   Temp:  [36.1 ??C (97 ??F)-38.7 ??C (101.7 ??F)] 36.1 ??C (97 ??F)  Heart Rate:  [105-140] 106  Resp:  [14-20] 16  BP: (109-133)/(74-96) 126/96  SpO2:  [94 %-100 %] 96 %    Gen: NAD, converses   HENT: atraumatic, normocephalic  Heart: RRR  Lungs: CTAB, no crackles or wheezes  Abdomen: soft, NTND  Extremities: No edema    General: Resting, lying in bed comfortably  Heart:  RRR. S1, S2. No murmurs, gallops, or rubs.  Lungs:  Breathing is unlabored. Mild wheezing at bilateral lung bases. No rales, ronchi, or crackles.    Abdomen:  Non-tender, non-distended. No palpable hepatomegaly or splenomegaly.   Skin:  No rashes, petechiae or purpura.   Neurologic:  Alert and oriented to person, place, time and situation.   Extremities:  Appear well-perfused. No clubbing, edema, or cyanosis.

## 2022-02-26 NOTE — Unmapped (Signed)
CVAD Liaison - Partial Occlusion Note    CVAD Liaison Nurse was consulted for partial occlusion of the  medial side of a dual port, Accessed the lateral side with brisk blood return and no pain on flushing, Medial side heparinized and de access at this time, needle used at medial is a non power needle too. Will re access with a power needle if needed.    At the bedside, pulsatile flushes were performed. Blood return was not confirmed.       Thank you for this consult,  Ivery Quale RN, CVAD Liaison     Consult Time 30 minutes (min)

## 2022-02-26 NOTE — Unmapped (Signed)
Problem: Adult Inpatient Plan of Care  Goal: Plan of Care Review  Outcome: Progressing  Flowsheets (Taken 02/26/2022 0537)  Progress: improving  Plan of Care Reviewed With: patient  Outcome Evaluation:   Note:   T max 38.0 this morning- recheck pending. Vitals stable- ongoing ST in 100-110s. Occasional dry cough, unable to provide sputum sample. CVAD liaison c/s for minimal BR on medial lumen- lumen reheparinized and lateral lumen accessed with good blood return. [employer] and CHG drsg. Headache/neck pain - pt not requesting pharmacological interventions for it. Wctm.    Vitals:    02/25/22 2048 02/25/22 2327 02/26/22 0349 02/26/22 0452   BP: 122/86 114/84 130/85 125/92   Pulse: 117 105 114 119   Resp: 18 18 14 16    Temp: 37.3 ??C (99.1 ??F) 37 ??C (98.6 ??F) 37.9 ??C (100.2 ??F) (!) 38 ??C (100.4 ??F)   TempSrc: Oral Oral Oral Oral   SpO2: 96% 100% 96% 96%     I/O this shift:  In: 600 [P.O.:500; IV Piggyback:100]  Out: -     Scheduled Meds:   Cefepime  2 g Intravenous Q8H    dapsone  100 mg Oral Daily    fluconazole  200 mg Oral Daily    prednisoLONE acetate  2 drop Both Eyes QID    sodium chloride  10 mL Intravenous BID    vancomycin  1,500 mg Intravenous Q12H     Continuous Infusions: none  PRN Meds:  Dibucaine x1 for hemorrhoids with no reported relief    Lab Results   Component Value Date/Time    NEUTROABS 0.1 (LL) 02/26/2022 03:54 AM    PLT 58 (L) 02/26/2022 03:54 AM    HGB 7.8 (L) 02/26/2022 03:54 AM    GLU 119 02/26/2022 03:54 AM    K 3.6 02/26/2022 03:54 AM    MG 1.7 02/26/2022 03:54 AM     Goal: Patient-Specific Goal (Individualized)  Outcome: Progressing  Flowsheets (Taken 02/26/2022 0537)  Patient-Specific Goals (Include Timeframe): pt will remain afebrile overnight. Pt will report adequate rest in the morning.  Individualized Care Needs: meds reviewed, care clustered, meds retimed, sleep promoted, CVAD care/CLABSI prevention, Spec A/Con initiated then [facility]. Enteric initiated pending Cdiff/GIPP r/o.  Anxieties, Fears or Concerns: repeated NF following MTX  Goal: Absence of Hospital-Acquired Illness or Injury  Outcome: Progressing  Goal: Optimal Comfort and Wellbeing  Outcome: Progressing  Intervention: Provide Person-Centered Care  Flowsheets (Taken 02/26/2022 0537)  Trust Relationship/Rapport:   care explained   questions encouraged   choices provided   reassurance provided   thoughts/feelings acknowledged   empathic listening provided   emotional support provided   questions answered     Problem: Infection  Goal: Absence of Infection Signs and Symptoms  Outcome: Progressing

## 2022-02-26 NOTE — Unmapped (Signed)
Completed care without difficulty, afebrile this shift. No changes to plan of care.    Problem: Adult Inpatient Plan of Care  Goal: Plan of Care Review  Outcome: Progressing  Goal: Patient-Specific Goal (Individualized)  Outcome: Progressing  Goal: Absence of Hospital-Acquired Illness or Injury  Outcome: Progressing  Intervention: Identify and Manage Fall Risk  Recent Flowsheet Documentation  Taken 02/26/2022 0725 by Regina Eck, RN  Safety Interventions:   isolation precautions   lighting adjusted for tasks/safety   low bed   nonskid shoes/slippers when out of bed  Intervention: Prevent Infection  Recent Flowsheet Documentation  Taken 02/26/2022 0725 by Regina Eck, RN  Infection Prevention:   environmental surveillance performed   hand hygiene promoted  Goal: Optimal Comfort and Wellbeing  Outcome: Progressing  Goal: Readiness for Transition of Care  Outcome: Progressing  Goal: Rounds/Family Conference  Outcome: Progressing

## 2022-02-26 NOTE — Unmapped (Signed)
OCCUPATIONAL THERAPY  Evaluation (02/26/22 1049)    Patient Name:  Patricia [name]       Medical Record Number: [id number]   Date of Birth: 09-Jul-1976  Sex: Female            OT Treatment Diagnosis:  Decreased activity tolerance impacting participation in ADL/IADL routines    Assessment  Problem List: Decreased endurance (tachycardia)    Clinical Decision Making: Low    Assessment: SOFINA Patricia [name] is an 46 y.o. 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 Patricia [name] presents to skilled acute OT with decreased activity tolerance. She reports independence at functional baseline, and she has experienced some deconditioning during treatment, but she has remained mod I/independent with all ADLs. Pt demonstrated mod I/independence with all functional performance during today's eval, but her further activity was limited 2/2 tachycardia. She will benefit from skilled acute OT to maximize functional safety and independence with ADL/IADL routines. No post-acute OT recommended at this time. Based on review of comorbidities, personal factors, and clinical presentation of exam findings, pt demonstrates a low complexity case for evaluation and treatment.    Today's Interventions: Other  Today's Interventions: Pt completed and assisted with bed mobility, sititing balance/tolerance, functional transfers, standing balance/tolerance, and functional mobility. Pt provided education re: role of skilled acute OT, importance of EOB/OOB activity, energy conservation, and OT POC.    Activity Tolerance During Today's Session  Other (limited by tachycardia)    Plan  Planned Frequency of Treatment:  1-2x per day for: 2-3x week  Planned Treatment Duration: 03/11/22    Planned Interventions:  Adaptive equipment, ADL retraining, Balance activities, Bed mobility, Compensatory tech. training, Conservation, Functional cognition, Environmental support, Endurance activities, Education - Family / caregiver, Education - Patient, Functional mobility, Home exercise program, Safety education, Therapeutic exercise, Transfer training, UE Strength / coordination exercise, Therapist provided opportunity for spontaneous movement    Post-Discharge Occupational Therapy Recommendations:   Skilled OT services NOT indicated   OT DME Recommendations: None -      GOALS:   Patient and Family Goals: To return to work in January    IP Long Term Goal #1: Pt will score 24/24 on the AMPAC within 6 weeks     Short Term:  SHORT GOAL #1: Pt will complete toilet t/f and toileting routine with mod I + LRAD prn   Time Frame : 2 weeks  SHORT GOAL #2: Pt will complete full body dressing including clothing retrieval with mod I + LRAD prn   Time Frame : 2 weeks  SHORT GOAL #3: Pt will complete 5 min standing grooming with mod I + LRAD prn    Prognosis:  Good  Positive Indicators:  Patricia [name], family support  Barriers to Discharge: None    Subjective  Current Status Pt received/left semi-reclined in bed with call bell in reach, all immediate needs met, husband present throughout, and RN aware  Prior Functional Status Pt reports independence at functional [place]. 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 Following discharge from her admisison earlier this year, she was using a shower chair, but she has since progressed back to independence with bathing. She primarily stays at home, and she occasionally drives shorter distances. Pt denied any falls. She lives with her husband who has been able to time off of work when needed to support pt, including taking her to appointments.  Medical Tests / Procedures: Reviewed       Patient / Caregiver reports: Pt agreeable to OT eval    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    Social History     Tobacco Use    Smoking status: Never    Smokeless tobacco: Never   Substance Use Topics Alcohol use: Not on file      Past Surgical History:   Procedure Laterality Date    IR INSERT PORT AGE GREATER THAN 5 YRS  12/08/2021    IR INSERT PORT AGE GREATER THAN 5 YRS 12/08/2021 [name], PA IMG VIR [facility]    History reviewed. No pertinent family history.     Erythromycin, Other, Sulfa (sulfonamide antibiotics), and Azithromycin     Objective Findings  Precautions / Restrictions  Isolation precautions (enteric)    Weight Bearing  Non-applicable    Required Braces or Orthoses  Non-applicable    Communication Preference  Verbal    Pain  Pt denied pain    Equipment / Environment  Vascular access (PIV, TLC, Port-a-cath, PICC)    Living Situation  Living Environment: House  Lives With: Spouse  Home Living: One level home, Stairs to enter without rails, Walk-in shower, Shower chair without back  Number of Stairs to [employer] (outside): 1  Equipment available at home: Paediatric nurse without back     Cognition   Orientation Level:  Oriented x 4   Arousal/Alertness:  Appropriate responses to stimuli   Attention Span:  Appears intact   Memory:      Following Commands:  Follows all commands and directions without difficulty   Safety Judgment:  Good awareness of safety precautions   Awareness of Errors:  Good awareness of errors made   Problem Solving:  Able to problem solve independently   Comments:      Vision / Hearing   Vision: No deficits identified     Hearing: No deficit identified       Hand Function:  Right Hand Function: Right hand grip strength, ROM and coordination WNL  Left Hand Function: Left hand grip strength, ROM and coordination WNL  Hand Dominance: Right    Skin Inspection:  Skin Inspection: Intact where visualized    ROM / Strength:  UE ROM/Strength: Left WFL, Right WFL  LE ROM/Strength: Left WFL, Right WFL    Coordination:  Coordination: WFL    Sensation:  RUE Sensation: RUE intact  LUE Sensation: LUE intact  RLE Sensation: RLE intact  LLE Sensation: LLE intact    Balance:  sitting: independent; standing: independnt    Functional Mobility  Transfer Assistance Needed: No (pt completed sit<>stand EOB with mod I, UE support)  Bed Mobility Assistance Needed: No (pt completed sup<>sit with independence)  Ambulation: Pt completed brief functional mobility at room-level with independence, distance limited by tachycardia    ADLs  ADLs: Modified Independent, Independent, Supervision (given pt's current level of functional performance, anticipate mod I/independence with most ADLs; supervision recommended for safety during bathing 2/2 tachycardia)    Vitals / Orthostatics  At Rest: HR: 108 supine in bed  With Activity: HR: 133 while standing EOB following functional mobility to door and back, 127 while sitting EOB following functional mobility  Vitals/Orthostatics: Pt reported mild dizziness while standing following sit>stand, symptoms resolved with time and seated rest. RN aware    Medical Staff Made Aware: RN    Occupational Therapy Session Duration  OT  Individual [mins]: 10  OT Co-Treatment [mins]: 8 (w/ Sandria Senter, PT)  Reason for Co-treatment: Poor activity tolerance       I attest that I have reviewed the above information.  Signed: Ancil Linsey, OT  Ceasar Mons 02/26/2022

## 2022-02-26 NOTE — Unmapped (Addendum)
Outpatient Follow-Up Issues:   [ ]  TTE as outpatient   [x]  Will need ambulatory GI surgery referral on discharge for hemorrhoids  [x]  Amb gyn referral for 8/3 CT findings of new complex cystic appearance of the cervix, which may be secondary to nabothian cysts versus other cervical pathology. Recommend correlation with endovaginal ultrasound versus colposcopy.  [x]  follow-up and infusion with Dr. Senaida Ores on 8/10    Patricia [name] is an 46 y.o. 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 Able to be discharged after afebrile >24h off cefepime and ANC risen to 0.5.    Active Problems    Febrile Neutropenia  Patient was febrile to 38.7 on arrival to infusion appointment 7/28. 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 She reported worsening cough, increased fatigue, and loose stools starting day prior to admission. No dyspnea or hypoxia on admission, but respiratory etiology suspected in light of centrilobular ground glass opacities in the LLL and symptoms (vs. MTX toxicity). Initial blood cultures and urine cultures negative. MRSA nares negative. Of note, patient had a similar presentation after completing C2 and was diagnosed with LLL PNA at that time. Patient started cefepime on 7/28 and given stability, abx were discontinued three times with recurrent fevers within 24h of discontinuation. Cefepime discontinued the day before discharge. By discharge, she had been afebrile ***h and ANC count was ***.     Ph+ALL in remission  Initially diagnosed in 09/2021. Please see H&P for full oncologic history. Started C4B of R-HyperCVAD/MA+dasatinib on 7/19 with IT cytarabine. Receiving IT once per cycle (methotrexate on odd cycles, cytarabine on even cycles). Post C3 Bmbx demonstrated no molecular evidence of disease. Next Bmbx scheduled for after cycle 8. Dasatinib continued during admission. Continued prophylaxis with: fluconazole, valtrex, dapsone.     External hemorrhoids  Patient having significant pain from hemorrhoids, noted this had gotten worse after most recent methotrexate course. Unfortunately, topical creams provided little effect, including sitz baths, lidocaine-based creams, calcium channel blockers, hydrocortisone cream. Counseled patient on risks of surgical hemorrhoidectomy given low ANC. Patient was reluctant to use laxatives due to prior issues with diarrhea to soften stools. Therefore, pain was treated with a small dose of PRN oxycodone 5mg  with counseling to use sparingly to avoid inducing constipation and exacerbating strain. Discharged on hydration, fiber, and preventative measures to prevent straining and fissure irritation along with a short course of oxycodone. Ambulatory referral provided to GI surgery on discharge.

## 2022-02-26 NOTE — Unmapped (Signed)
Hematology/Oncology Brief Admission Communication Note    Patricia [name] is a 46 y.o. 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      HPI/Reason for admission: She is being admitted today due to neutropenic fever.     Plan for infusion center:   If the patient has an active treatment plan, chemotherapy to be administered: no    Infusions or supportive care ordered to be started in infusion:   - blood cx x 2  - UA  - CXR  - CBC, CMP  - T&S     Additional testing/orders to be carried out prior to admission:     The preliminary plan and goals of admission are:  - admit for neutropenic fever workup     The primary outpatient oncologist for this patient is Dr. Senaida Ores, [name] NP    General:               resting comfortably, chronically ill-appearing  HEENT:               conjunctiva clear, normal voice, vision intact, hearing intact  Cardiovascular:   no murmurs, rubs, or gallops. warm and well-perfused. tachycardic.  Respiratory:         no wheezes, rhonchi, or rales no accessory muscle use  Musculoskeletal:  no bony pain or tenderness  Extremities:          extremities are warm and without edema, no clubbing or cyanosis  Skin:                    warm, dry, and no rashes, lesions or breakdown  Psychiatric:          appropriate and cooperative  Neuro:                 alert & oriented x 3, normal speech    Lab Results   Component Value Date    WBC 0.3 (LL) 02/25/2022    HGB 9.4 (L) 02/25/2022    HCT 26.7 (L) 02/25/2022    PLT  02/25/2022      Comment:      Pending.       Lab Results   Component Value Date    NA 143 02/21/2022    K 4.0 02/21/2022    CL 108 (H) 02/21/2022    CO2 29.0 02/21/2022    BUN 19 02/21/2022    CREATININE 0.60 02/21/2022    GLU 114 02/21/2022    CALCIUM 8.4 (L) 02/21/2022    MG 2.0 01/15/2022    PHOS 2.5 01/15/2022       Lab Results   Component Value Date    BILITOT 1.0 02/20/2022    BILITOT 1.0 02/20/2022    BILIDIR 0.30 02/20/2022    PROT 5.5 (L) 02/20/2022    PROT 5.5 (L) 02/20/2022    ALBUMIN 3.4 02/20/2022    ALBUMIN 3.4 02/20/2022    ALT 91 (H) 02/20/2022    ALT 91 (H) 02/20/2022    AST 39 (H) 02/20/2022    AST 39 (H) 02/20/2022    ALKPHOS 70 02/20/2022    ALKPHOS 70 02/20/2022       Lab Results   Component Value Date    PT 10.4 11/01/2021    INR 0.92 11/01/2021    APTT 22.5 (L) 11/01/2021        Erlinda Hong, PA

## 2022-02-26 NOTE — Unmapped (Signed)
Vancomycin Therapeutic Monitoring Pharmacy Note    Taletha Bissinger is a 46 y.o. female starting vancomycin. Date of therapy initiation: 02/25/22    Indication: Febrile Neutropenia    Prior Dosing Information: None/new initiation     Goals:  Therapeutic Drug Levels  Vancomycin trough goal: 10-15 mg/L    Additional Clinical Monitoring/Outcomes  Renal function, volume status (intake and output)    Results: Not applicable    Wt Readings from Last 1 Encounters:   02/22/22 85 kg (187 lb 4.8 oz)     Creatinine   Date Value Ref Range Status   02/25/2022 0.44 (L) 0.60 - 0.80 mg/dL Final   16/05/9603 5.40 0.60 - 0.80 mg/dL Final   98/06/9146 8.29 0.60 - 0.80 mg/dL Final   56/21/3086 5.78 0.60 - 0.80 mg/dL Final        Pharmacokinetic Considerations and Significant Drug Interactions:  Adult (estimated initial): Vd = 36.92 L, ke = 0.116 hr-1  Concurrent nephrotoxic meds: not applicable    Assessment/Plan:  Recommendation(s)  Start vancomycin 1750mg  loading dose, followed with 1500mg  q12 hr  Estimated trough on recommended regimen:  10-11 mg/L    Follow-up  Level due: prior to fourth or fifth dose  A pharmacist will continue to monitor and order levels as appropriate    Please page service pharmacist with questions/clarifications.    Candise Che, PharmD

## 2022-02-26 NOTE — Unmapped (Signed)
PHYSICAL THERAPY  Evaluation (02/26/22 1050)          Patient Name:  Patricia [name]       Medical Record Number: [id number]   Date of Birth: August 01, 1976  Sex: Female        Treatment Diagnosis: Decreased endurance     Activity Tolerance: Tolerated treatment well     ASSESSMENT  Problem List: Decreased endurance      Assessment : Patricia [name] is a 46 y.o. 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 Pt presents to acute PT with decreased endurance. Pt is independent in ambulation and OOB mobility, but is limited by tachycardia with activity. Pt will continue to benefit from skilled acute PT services to prevent deconditioning but has no anticipated post acute PT needs at this time. After a review of the personal factors, comorbidities, clinical presentation, and examination of the number of affected body systems, the patient presents as a moderate complexity case.      Today's Interventions: Pt eval, functional mobility as noted, Pt and caregiver education re: PT role and POC, activity pacing, monitoring s/s HR with activity                            PLAN  Planned Frequency of Treatment:  1-2x per day for: 2-3x week       Planned Interventions: Education - Patient, Education - Family / caregiver, Endurance activities, Home exercise program, Museum/gallery curator, Therapeutic exercise, Therapeutic activity, Functional mobility, Self-care / Home training     Post-Discharge Physical Therapy Recommendations:  Skilled PT services NOT indicated     PT DME Recommendations: None            Goals:   Patient and Family Goals: to improve energy and be able to return to work in January.     Long Term Goal #1: Pt will ambulate 0.5 miles independently in 10 weeks        SHORT GOAL #1: Pt will ambulate 200' independently.               Time Frame : 2 weeks                                                                                Prognosis:  Good  Positive Indicators: PLOF, caregiver support, motivation  Barriers to Discharge: None     SUBJECTIVE  Patient reports: Agreeable to PT  Current Functional Status: Pt began and ended session in supine, HOB elevated, needs in reach, spouse in room. RN made aware     Prior Functional Status: Pt generally ambulates independently with no AD, but recently will walk while pushing a wheelchair when ambulating in the hosptial for appointments for stability when her stamina is low due to treatments. Pt denies falls or LOB. 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 She reports being fairly sedentary at home.  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 No pertinent family history.     Allergies: Erythromycin, Other, Sulfa (sulfonamide antibiotics), and Azithromycin                  Objective Findings  Precautions / Restrictions  Precautions: Isolation precautions  Weight Bearing Status: Non-applicable  Required Braces or Orthoses: Non-applicable     Communication Preference: Verbal          Pain Comments: Pt denied pain  Medical Tests / Procedures: Medical records reviewed  Equipment / Environment: Vascular access (PIV, TLC, Port-a-cath, PICC)     At Rest: HR 108  With Activity: After walking from bed <>door HR 133. pt then sat with HR 127  Orthostatics: pt reported feeling a little woozy but subsided after sitting        Living Situation  Living Environment: House  Lives With: Spouse  Home Living: One level home, Stairs to enter without rails  Number of Stairs to Enter (outside): 1      Cognition: WFL  Visual/Perception: Wears Glasses/Contacts     Skin Inspection: Intact where visualized     Upper Extremities  UE ROM: Right WFL, Left WFL  UE Strength: Right WFL, Left WFL    Lower Extremities  LE ROM: Right WFL, Left WFL  LE Strength: Left WFL, Right WFL     Sensation: WFL  Balance: WFL  Posture: WFL      Bed Mobility: Supine to Sit  Supine to Sit assistance level: Modified independent, requires aide device or extra time  Bed Mobility: Pt sat up using BUE to push off of bed     Transfers: Sit to Stand  Sit to Stand assistance level: Independent      Gait Level of Assistance: Independent  Gait Assistive Device: None  Gait Distance Ambulated (ft): 30 ft  Gait: Pt ambulated 30 ft independently demonstrating steady gait. Distance limited by tachycardia.     Stairs: Not assessed            Endurance: mild fatigue, endurance below baseline     Physical Therapy Session Duration  PT Individual [mins]: 8  PT Co-Treatment [mins]: 10  Reason for Co-treatment: Poor activity tolerance     Medical Staff Made Aware: RN made aware     I attest that I have reviewed the above information.  Signed: Salome Holmes, PT  Filed 02/26/2022     The care for this patient was completed by Salome Holmes, PT:  A student was present and participated in the care. Licensed/Credentialed therapist was physically present and immediately available to direct and supervise tasks that were related to patient management. The direction and supervision was continuous throughout the time these tasks were performed.    Salome Holmes, PT

## 2022-02-26 NOTE — Unmapped (Signed)
Hematology/Oncology     Attending Physician :  Dr. Leotis Pain  Accepting Service  : Oncology/Hematology ([facility])  Reason for Admission: Neutropenic Fever    Problem List:   Patient Active Problem List   Diagnosis    Leukocytosis    B-cell acute lymphoblastic leukemia (ALL) (CMS-HCC)    Acute cough    Dyspepsia    Acute frontal sinusitis        Assessment/Plan: Patricia [name] is an 46 y.o. 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     Febrile Neutropenia  Febrile to 38.7 on arrival to infusion appointment 7/28. Patient started to feel unwell last night with increased fatigue, worsening of her cough, and new fever. Was recently discharged on 7/24 after starting C4 of HCVAD. She has chronic sinus problems that she was recently told may be due to migraines. She has had minimal sputum production and has not been SOB or hypoxic. No known sick contacts. Of note, she received IT chemo during that admission and has since had back and neck pain along with a headache. She does have a history of chronic migraines. Suspect fever is more likely from respiratory etiology but can also consider meningitis.  - Vancomycin and cefepime for neutropenic fever with possible respiratory etiology  - CXR  - RPP, LRCx pending  - LDH pending  - continue home ppx regimen as below    Ph+ALL in remission  Initially diagnosed in 09/2021. Please see below for full oncologic history. Started C4B of R-HyperCVAD/MA+dasatinib on 7/19 with IT cytarabine. Receiving IT once per cycle (methotrexate on odd cycles, cytarabine on even cycles). Post C3 Bmbx demonstrated no molecular evidence of disease. Next Bmbx scheduled for after cycle 8.  - daily CBC w diff  - ppx: fluconazole, valtrex, and dapsone  - continue prednisolone eye drops     Immunocompromised status: Patient is immunocompromised secondary to ALL disease or chemotherapy  -Antimicrobial prophylaxis as above     Impending Electrolyte Abnormality Secondary to Chemotherapy and/or IV Fluids  -Daily Electrolyte monitoring  -Replete per [facility] guidelines.       HPI: Patricia [name] is an 46 y.o. female who was found to be febrile during her infusion treatment today.  She reports that she started to feel unwell last night when she became feverish and had increased cough and fatigue. She has had mildly increased sputum production, no hemoptysis.  She was recently was treated with a 7 day course of augmentin (completed 7/19) for sinusitis. This helped with her symptoms but she continues to have chronic sinus headaches. She was recently admitted 7/19-7/24 for C4 of chemotherapy. During her admission she underwent an LP with IT cytarabine. Since that time she has had back and neck pain along with headaches. Tylenol has mildly helped manage the pain. No fevers until last night. Denies any vision changes.     She reports a history of a similar presentation after her second cycle of chemotherapy.  At that time she initially felt fine after discharging from the hospital but developed a cough several days later and was diagnosed with LLL pneumonia.     Denies any nausea, vomiting, diarrhea, dysuria, urinary urgency, shortness of breath, chest pain, or vision changes.      Review of Systems: All positive and pertinent negatives are noted in the HPI; otherwise all other systems are negative    Oncologic History:   Primary Oncologist: Dr. Senaida Ores  Oncology History  Overview Note   Referring/Local Oncologist:    Diagnosis:   10/29/2021  Bone marrow, left iliac, aspiration and biopsy  -  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 The majority of the abnormal cells (64/88) contained an additional BCR/ABL1 fusion signal, while 9/88 abnormal cells contained an additional ABL1 and ASS1 signal.       Genetics:   Karyotype/FISH:   RESULTS   Date Value Ref Range Status   10/28/2021   Final    NOTE: This report reflects a combined study from a peripheral blood and a bone marrow core biopsy. Eleven cells from the peripheral blood and nine cells from the bone marrow core biopsy were analyzed. The BCR/ABL1 FISH analysis was performed on the peripheral blood.     Abnormal Karyotype:  47,XX,t(9;22)(q34;q11.2),+der(22)t(9;22)[18]/46,XX[2]    Abnormal FISH:  A BCR/ABL1 interphase FISH assay showed a signal pattern consistent with a BCR::ABL1 rearrangement and the 9;22 translocation in 88% of the 100 cells scored. The majority of the abnormal cells (64/88) contained an additional BCR/ABL1 fusion signal, while 9/88 abnormal cells contained an additional ABL1 and ASS1 signal.           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  MRD by flow insufficient, p190 2/100,000  12/10/2021: Cycle 2 B cycle + rituximab  12/14/21: IT#4  12/17/21: IT#5  01/10/22: Cycle 3 A cycle + rituximab  01/24/22: Bmbx - MRD-neg by PCR (p190)   ~02/09/22: PLAN C4          B-cell acute lymphoblastic leukemia (ALL) (CMS-HCC)   10/29/2021 Initial Diagnosis    B-cell acute lymphoblastic leukemia (ALL) (CMS-HCC)       10/30/2021 -  Chemotherapy    IP/OP LEUKEMIA GRAAPH-2005 < 60 YO (OP PEGFILGRASTIM ON DAY 7)  [No description for this plan]       01/06/2022 Endocrine/Hormone Therapy    OP LEUPROLIDE (LUPRON) 11.25 MG EVERY 3 MONTHS  Plan Provider: [name], MD           Medical History:  PCP: [name], MD  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    Surgical History:  Past Surgical History:   Procedure Laterality Date    IR INSERT PORT AGE GREATER THAN 5 YRS  12/08/2021    IR INSERT PORT AGE GREATER THAN 5 YRS 12/08/2021 [name], PA IMG [facility]      Social History:  Social History     Socioeconomic History    Marital status: Married   Tobacco Use    Smoking status: Never    Smokeless tobacco: Never     Social Determinants of Health     Financial Resource Strain: Low Risk     Difficulty of Paying Living Expenses: Not hard at all   Food Insecurity: No Food  Insecurity    Worried About Programme researcher, broadcasting/film/video in the Last Year: Never true    Ran Out of Food in the Last Year: Never true   Transportation Needs: No Transportation Needs    Lack of Transportation (Medical): No    Lack of Transportation (Non-Medical): No      Family History:   No family history of blood cancers.     Allergies: is allergic to erythromycin, other, sulfa (sulfonamide antibiotics), and azithromycin.    Medications:   Meds:   Cefepime  2 g Intravenous Once    vancomycin  1,750 mg Intravenous Once     Continuous Infusions:  PRN Meds:.    Objective:   Vitals: Temp:  [38.3 ??C (101 ??F)-38.7 ??C (101.7 ??F)] 38.7 ??C (101.7 ??F)  Heart Rate:  [139-140] 139  Resp:  [18] 18  BP: (133)/(86) 133/86  SpO2:  [94 %] 94 %    Physical Exam:  General: Resting, sitting in infusion chair  Heart:  RRR. S1, S2. No murmurs, gallops, or rubs.  Lungs:  Breathing is unlabored. Lungs CTAB. No rales, ronchi, or crackles.    Abdomen:  No distention or pain on palpation.  No palpable hepatomegaly or splenomegaly.  No palpable masses.  Skin:  No rashes, petechiae or purpura.  No areas of skin breakdown. Warm to touch, dry, smooth, and even.  Musculoskeletal:  No grossly-evident joint effusions or deformities.  Range of motion about the shoulder, elbow, hips and knees is grossly normal.  Psychiatric:  Range of affect is appropriate.    Neurologic:  Alert and oriented to person, place, time and situation.   Extremities:  Appear well-perfused. No clubbing, edema, or cyanosis.      Test Results  Recent Labs     02/25/22  1444   WBC 0.3*   HGB 9.4*     No results for input(s): NA, K, CL, CO2, BUN, CREATININE, MG, PHOS, CALCIUM in the last 72 hours.    Imaging: Radiology studies were personally reviewed    DVT PPX Indicated: yes, pharmacologic prophylaxis (with any of the following: lovenox 40 mg daily)  FEN:  Discharge Plan:  - fluids: no  - electrolytes: stable   - diet: regular     Need for PT: yes  Anticipated Discharge: Their home    Code Status: @PATFPLSTATCODE @   Full Code      Time spent on counseling/coordination of care: 45 Minutes  Total time spent with patient: 30 Minutes

## 2022-02-27 LAB — HEPATIC FUNCTION PANEL
ALBUMIN: 3.4 g/dL (ref 3.4–5.0)
ALKALINE PHOSPHATASE: 59 U/L (ref 46–116)
ALT (SGPT): 84 U/L — ABNORMAL HIGH (ref 10–49)
AST (SGOT): 33 U/L (ref <=34)
BILIRUBIN DIRECT: 0.2 mg/dL (ref 0.00–0.30)
BILIRUBIN TOTAL: 0.7 mg/dL (ref 0.3–1.2)
PROTEIN TOTAL: 5.6 g/dL — ABNORMAL LOW (ref 5.7–8.2)

## 2022-02-27 LAB — BASIC METABOLIC PANEL
ANION GAP: 7 mmol/L (ref 5–14)
BLOOD UREA NITROGEN: 13 mg/dL (ref 9–23)
BUN / CREAT RATIO: 23
CALCIUM: 8.4 mg/dL — ABNORMAL LOW (ref 8.7–10.4)
CHLORIDE: 109 mmol/L — ABNORMAL HIGH (ref 98–107)
CO2: 23 mmol/L (ref 20.0–31.0)
CREATININE: 0.57 mg/dL — ABNORMAL LOW
EGFR CKD-EPI (2021) FEMALE: >90 mL/min/{1.73_m2} (ref >=60)
GLUCOSE RANDOM: 174 mg/dL (ref 70–179)
POTASSIUM: 3.7 mmol/L (ref 3.4–4.8)
SODIUM: 139 mmol/L (ref 135–145)

## 2022-02-27 LAB — CBC W/ AUTO DIFF
BASOPHILS ABSOLUTE COUNT: 0 10*9/L (ref 0.0–0.1)
BASOPHILS RELATIVE PERCENT: 0 %
EOSINOPHILS ABSOLUTE COUNT: 0 10*9/L (ref 0.0–0.5)
EOSINOPHILS RELATIVE PERCENT: 6.2 %
HEMATOCRIT: 20.3 % — ABNORMAL LOW (ref 34.0–44.0)
HEMOGLOBIN: 7.2 g/dL — ABNORMAL LOW (ref 11.3–14.9)
LYMPHOCYTES ABSOLUTE COUNT: 0.1 10*9/L — ABNORMAL LOW (ref 1.1–3.6)
LYMPHOCYTES RELATIVE PERCENT: 83.3 %
MEAN CORPUSCULAR HEMOGLOBIN CONC: 35.6 g/dL (ref 32.0–36.0)
MEAN CORPUSCULAR HEMOGLOBIN: 32.8 pg — ABNORMAL HIGH (ref 25.9–32.4)
MEAN CORPUSCULAR VOLUME: 92 fL (ref 77.6–95.7)
MEAN PLATELET VOLUME: 6 fL — ABNORMAL LOW (ref 6.8–10.7)
MONOCYTES ABSOLUTE COUNT: 0 10*9/L — ABNORMAL LOW (ref 0.3–0.8)
MONOCYTES RELATIVE PERCENT: 0.9 %
NEUTROPHILS ABSOLUTE COUNT: 0 10*9/L — CL (ref 1.8–7.8)
NEUTROPHILS RELATIVE PERCENT: 9.6 %
PLATELET COUNT: 32 10*9/L — ABNORMAL LOW (ref 150–450)
RED BLOOD CELL COUNT: 2.21 10*12/L — ABNORMAL LOW (ref 3.95–5.13)
RED CELL DISTRIBUTION WIDTH: 19.9 % — ABNORMAL HIGH (ref 12.2–15.2)
WBC ADJUSTED: 0.2 10*9/L — CL (ref 3.6–11.2)

## 2022-02-27 LAB — MAGNESIUM: MAGNESIUM: 1.7 mg/dL (ref 1.6–2.6)

## 2022-02-27 LAB — PHOSPHORUS: PHOSPHORUS: 3.8 mg/dL (ref 2.4–5.1)

## 2022-02-27 MED ADMIN — dasatinib (SPRYCEL) tablet 70 mg: 70 mg | ORAL | @ 13:00:00

## 2022-02-27 MED ADMIN — sodium chloride (NS) 0.9 % flush 10 mL: 10 mL | INTRAVENOUS

## 2022-02-27 MED ADMIN — oxyCODONE (ROXICODONE) immediate release tablet 10 mg: 10 mg | ORAL | @ 01:00:00 | Stop: 2022-03-12

## 2022-02-27 MED ADMIN — cefepime (MAXIPIME) 2 g in sodium chloride 0.9 % (NS) 100 mL IVPB-MBP: 2 g | INTRAVENOUS | @ 16:00:00 | Stop: 2022-03-05

## 2022-02-27 MED ADMIN — sodium chloride (NS) 0.9 % flush 10 mL: 10 mL | INTRAVENOUS | @ 13:00:00

## 2022-02-27 MED ADMIN — HYDROmorphone (DILAUDID) tablet 1 mg: 1 mg | ORAL | @ 21:00:00 | Stop: 2022-03-02

## 2022-02-27 MED ADMIN — cetirizine (ZyrTEC) tablet 10 mg: 10 mg | ORAL | @ 13:00:00

## 2022-02-27 MED ADMIN — prednisoLONE acetate (PRED FORTE) 1 % ophthalmic suspension 2 drop: 2 [drp] | OPHTHALMIC | @ 21:00:00 | Stop: 2022-02-27

## 2022-02-27 MED ADMIN — valACYclovir (VALTREX) tablet 500 mg: 500 mg | ORAL | @ 13:00:00

## 2022-02-27 MED ADMIN — HYDROmorphone (DILAUDID) tablet 1 mg: 1 mg | ORAL | @ 17:00:00 | Stop: 2022-03-02

## 2022-02-27 MED ADMIN — HYDROmorphone (DILAUDID) tablet 1 mg: 1 mg | ORAL | @ 13:00:00 | Stop: 2022-03-02

## 2022-02-27 MED ADMIN — cefepime (MAXIPIME) 2 g in sodium chloride 0.9 % (NS) 100 mL IVPB-MBP: 2 g | INTRAVENOUS | @ 09:00:00 | Stop: 2022-03-05

## 2022-02-27 MED ADMIN — prednisoLONE acetate (PRED FORTE) 1 % ophthalmic suspension 2 drop: 2 [drp] | OPHTHALMIC | @ 01:00:00 | Stop: 2022-02-27

## 2022-02-27 MED ADMIN — fluconazole (DIFLUCAN) tablet 200 mg: 200 mg | ORAL | @ 13:00:00

## 2022-02-27 MED ADMIN — prednisoLONE acetate (PRED FORTE) 1 % ophthalmic suspension 2 drop: 2 [drp] | OPHTHALMIC | @ 10:00:00 | Stop: 2022-02-27

## 2022-02-27 MED ADMIN — prednisoLONE acetate (PRED FORTE) 1 % ophthalmic suspension 2 drop: 2 [drp] | OPHTHALMIC | @ 16:00:00 | Stop: 2022-02-27

## 2022-02-27 NOTE — Unmapped (Signed)
Hematology Resident ([facility]) Progress Note    Assessment & Plan:   Patricia [name] is an 46 y.o. 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  Also recently with gum bleeding in the setting of thrombocytopenia and pain from hemorrhoids.    Active Problems:    B-cell acute lymphoblastic leukemia (ALL) (CMS-HCC)    Acute cough    Pancytopenia (CMS-HCC)    Febrile neutropenia (CMS-HCC)  Resolved Problems:    * No resolved hospital problems. *      Active Problems    Febrile Neutropenia  Febrile to 38.7 on arrival to infusion appointment 7/28.  Worsening cough and increased fatigue starting day prior to admission.  Recently discharged on 7/24 after starting C4 of HCVAD.  CT chest noted with left lower lobe nodular groundglass opacity which after discussions with Dr. Zenaida Niece could be PNA vs methotrexate related phenomenon.  Has improved on IV cefepime alone.  - Cefepime initiated 7/28 -   - MRSA nares screen negative have discontinued vancomycin  - LRCx -collected 7/30  - continue home ppx regimen as below     Ph+ALL in remission  Initially diagnosed in 09/2021. Please see H&P for full oncologic history. Started C4B of R-HyperCVAD/MA+dasatinib on 7/19 with IT cytarabine. Receiving IT once per cycle (methotrexate on odd cycles, cytarabine on even cycles). Post C3 Bmbx demonstrated no molecular evidence of disease. Next Bmbx scheduled for after cycle 8.  - continue dasatinib while admitted  -Hemoglobin goal >7; PLT goal >10K if gum bleeding more significant and causing hypodynamic instability raise PLT threshold to 20K  - daily CBC w diff  - ppx: fluconazole, valtrex, and dapsone  - continue prednisolone eye drops     Hemorrhoids  Patient continues to report bright red blood per rectum.  Continue sitz baths.  Hydrocortisone cream twice daily.  Have also ordered oxy 5/10 panel PRN for moderate pain; and Dilaudid 1 mg p.o. every 4 hours for severe pain    Mucosal gum bleeding  Provoked iso thrombocytopenia.  -Amicar oral solution every hour as needed swish and spit     Immunocompromised status: Patient is immunocompromised secondary to ALL disease or chemotherapy  -Antimicrobial prophylaxis as above     Impending Electrolyte Abnormality Secondary to Chemotherapy and/or IV Fluids  -Daily Electrolyte monitoring  -Replete per [facility] guidelines.       Daily Checklist:  Diet: Regular Diet  DVT PPx: Contradindicated - Thrombocytopenic  Electrolytes: Replete Potassium to >/= 3.6 and Magnesium to >/= 1.8  Code Status: Full Code  Dispo: Home, earliest anticipated 7/31 if ANC rises and still afebrile but likely longer course IV abx while inpatient    Team Contact Information:   Primary Team: Hematology Resident ([facility])  Primary Resident: Barnett Abu, MD  Resident's Pager: [phone] (Hematology Senior Resident)    Interval History:   No acute events overnight.  Still afebrile and satting well on room air.  Her main reports were from pain at her hemorrhoid site was given p.o. Dilaudid today.  No longer having diarrhea canceled GI pathogen and C. difficile studies.  Having ongoing small-volume mucosal gum bleeding especially around her anterior bottom teeth--have started Amicar rinse and spit.     All other systems were reviewed and are negative except as noted in the HPI    Objective:   Temp:  [36.1 ??C (97 ??F)-37.7 ??C (99.9 ??F)] 36.9 ??C (98.4 ??F)  Heart Rate:  [  100-119] 114  Resp:  [16-18] 18  BP: (99-126)/(59-96) 100/59  SpO2:  [94 %-97 %] 97 %      General: Resting, lying in bed comfortably  HEENT: slow ooze of blood from gums on bottom anterior teeth; no visible oral ulcers  Heart:  RRR. S1, S2. No murmurs, gallops, or rubs.  Lungs:  Breathing is unlabored.  Skin: Warm and well-perfused  Neurologic:  Alert and oriented to person, place, time and situation.     [name]  PGY-2 Internal Medicine    Some portions of this note were dictated using Dragon Software and may contain voice recognition errors. Please excuse any typos/errors.

## 2022-02-28 LAB — BASIC METABOLIC PANEL
ANION GAP: 4 mmol/L — ABNORMAL LOW (ref 5–14)
BLOOD UREA NITROGEN: 16 mg/dL (ref 9–23)
BUN / CREAT RATIO: 28
CALCIUM: 8.4 mg/dL — ABNORMAL LOW (ref 8.7–10.4)
CHLORIDE: 111 mmol/L — ABNORMAL HIGH (ref 98–107)
CO2: 24 mmol/L (ref 20.0–31.0)
CREATININE: 0.58 mg/dL — ABNORMAL LOW
EGFR CKD-EPI (2021) FEMALE: >90 mL/min/{1.73_m2} (ref >=60)
GLUCOSE RANDOM: 95 mg/dL (ref 70–179)
POTASSIUM: 3.8 mmol/L (ref 3.4–4.8)
SODIUM: 139 mmol/L (ref 135–145)

## 2022-02-28 LAB — CBC W/ AUTO DIFF
BASOPHILS ABSOLUTE COUNT: 0 10*9/L (ref 0.0–0.1)
BASOPHILS RELATIVE PERCENT: 0 %
EOSINOPHILS ABSOLUTE COUNT: 0 10*9/L (ref 0.0–0.5)
EOSINOPHILS RELATIVE PERCENT: 5.3 %
HEMATOCRIT: 19.4 % — ABNORMAL LOW (ref 34.0–44.0)
HEMOGLOBIN: 7 g/dL — ABNORMAL LOW (ref 11.3–14.9)
LYMPHOCYTES ABSOLUTE COUNT: 0.3 10*9/L — ABNORMAL LOW (ref 1.1–3.6)
LYMPHOCYTES RELATIVE PERCENT: 90.4 %
MEAN CORPUSCULAR HEMOGLOBIN CONC: 36.2 g/dL — ABNORMAL HIGH (ref 32.0–36.0)
MEAN CORPUSCULAR HEMOGLOBIN: 32.9 pg — ABNORMAL HIGH (ref 25.9–32.4)
MEAN CORPUSCULAR VOLUME: 90.8 fL (ref 77.6–95.7)
MEAN PLATELET VOLUME: 6.1 fL — ABNORMAL LOW (ref 6.8–10.7)
MONOCYTES ABSOLUTE COUNT: 0 10*9/L — ABNORMAL LOW (ref 0.3–0.8)
MONOCYTES RELATIVE PERCENT: 1.8 %
NEUTROPHILS ABSOLUTE COUNT: 0 10*9/L — CL (ref 1.8–7.8)
NEUTROPHILS RELATIVE PERCENT: 2.5 %
PLATELET COUNT: 17 10*9/L — ABNORMAL LOW (ref 150–450)
RED BLOOD CELL COUNT: 2.14 10*12/L — ABNORMAL LOW (ref 3.95–5.13)
RED CELL DISTRIBUTION WIDTH: 19.4 % — ABNORMAL HIGH (ref 12.2–15.2)
WBC ADJUSTED: 0.3 10*9/L — CL (ref 3.6–11.2)

## 2022-02-28 LAB — HEPATIC FUNCTION PANEL
ALBUMIN: 3.4 g/dL (ref 3.4–5.0)
ALKALINE PHOSPHATASE: 60 U/L (ref 46–116)
ALT (SGPT): 99 U/L — ABNORMAL HIGH (ref 10–49)
AST (SGOT): 42 U/L — ABNORMAL HIGH (ref <=34)
BILIRUBIN DIRECT: 0.2 mg/dL (ref 0.00–0.30)
BILIRUBIN TOTAL: 0.7 mg/dL (ref 0.3–1.2)
PROTEIN TOTAL: 5.7 g/dL (ref 5.7–8.2)

## 2022-02-28 LAB — MAGNESIUM: MAGNESIUM: 1.9 mg/dL (ref 1.6–2.6)

## 2022-02-28 LAB — PHOSPHORUS: PHOSPHORUS: 3.4 mg/dL (ref 2.4–5.1)

## 2022-02-28 MED ADMIN — cefepime (MAXIPIME) 2 g in sodium chloride 0.9 % (NS) 100 mL IVPB-MBP: 2 g | INTRAVENOUS | @ 10:00:00 | Stop: 2022-03-05

## 2022-02-28 MED ADMIN — HYDROmorphone (DILAUDID) tablet 1 mg: 1 mg | ORAL | Stop: 2022-03-02

## 2022-02-28 MED ADMIN — dasatinib (SPRYCEL) tablet 70 mg: 70 mg | ORAL | @ 13:00:00

## 2022-02-28 MED ADMIN — sodium chloride (NS) 0.9 % flush 10 mL: 10 mL | INTRAVENOUS | @ 01:00:00

## 2022-02-28 MED ADMIN — dapsone tablet 100 mg: 100 mg | ORAL

## 2022-02-28 MED ADMIN — fluconazole (DIFLUCAN) tablet 200 mg: 200 mg | ORAL | @ 14:00:00

## 2022-02-28 MED ADMIN — cefepime (MAXIPIME) 2 g in sodium chloride 0.9 % (NS) 100 mL IVPB-MBP: 2 g | INTRAVENOUS | Stop: 2022-03-05

## 2022-02-28 MED ADMIN — dapsone tablet 100 mg: 100 mg | ORAL | @ 01:00:00

## 2022-02-28 MED ADMIN — cefepime (MAXIPIME) 2 g in sodium chloride 0.9 % (NS) 100 mL IVPB-MBP: 2 g | INTRAVENOUS | @ 15:00:00 | Stop: 2022-03-05

## 2022-02-28 MED ADMIN — HYDROmorphone (DILAUDID) tablet 1 mg: 1 mg | ORAL | @ 20:00:00 | Stop: 2022-03-02

## 2022-02-28 MED ADMIN — HYDROmorphone (DILAUDID) tablet 1 mg: 1 mg | ORAL | @ 10:00:00 | Stop: 2022-03-02

## 2022-02-28 MED ADMIN — HYDROmorphone (DILAUDID) tablet 1 mg: 1 mg | ORAL | @ 13:00:00 | Stop: 2022-03-02

## 2022-02-28 MED ADMIN — HYDROmorphone (DILAUDID) tablet 1 mg: 1 mg | ORAL | @ 01:00:00 | Stop: 2022-03-02

## 2022-02-28 MED ADMIN — valACYclovir (VALTREX) tablet 500 mg: 500 mg | ORAL | @ 13:00:00

## 2022-02-28 MED ADMIN — prednisoLONE acetate (PRED FORTE) 1 % ophthalmic suspension 2 drop: 2 [drp] | OPHTHALMIC | @ 01:00:00 | Stop: 2022-02-27

## 2022-02-28 MED ADMIN — sodium chloride (NS) 0.9 % flush 10 mL: 10 mL | INTRAVENOUS | @ 13:00:00

## 2022-02-28 MED ADMIN — cetirizine (ZyrTEC) tablet 10 mg: 10 mg | ORAL | @ 13:00:00

## 2022-02-28 MED ADMIN — cefepime (MAXIPIME) 2 g in sodium chloride 0.9 % (NS) 100 mL IVPB-MBP: 2 g | INTRAVENOUS | @ 01:00:00 | Stop: 2022-03-05

## 2022-02-28 NOTE — Unmapped (Signed)
Hematology Resident ([facility]) Progress Note    Assessment & Plan:   Patricia [name] is an 46 y.o. 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  Also recently with gum bleeding in the setting of thrombocytopenia and pain from hemorrhoids.    Active Problems:    B-cell acute lymphoblastic leukemia (ALL) (CMS-HCC)    Acute cough    Pancytopenia (CMS-HCC)    Febrile neutropenia (CMS-HCC)  Resolved Problems:    * No resolved hospital problems. *      Active Problems    Febrile Neutropenia  Febrile to 38.7 on arrival to infusion appointment 7/28.  Worsening cough and increased fatigue starting day prior to admission.  Recently discharged on 7/24 after starting C4 of HCVAD.  CT chest noted with left lower lobe nodular groundglass opacity which after discussions with Dr. Zenaida Niece could be PNA vs methotrexate related phenomenon.  Has improved on IV cefepime alone.  - Cefepime initiated 7/28 -   - MRSA nares screen negative - have discontinued vancomycin  - LRCx -collected 7/30, pending  - continue home ppx regimen as below     Ph+ALL in remission  Initially diagnosed in 09/2021. Please see H&P for full oncologic history. Started C4B of R-HyperCVAD/MA+dasatinib on 7/19 with IT cytarabine. Receiving IT once per cycle (methotrexate on odd cycles, cytarabine on even cycles). Post C3 Bmbx demonstrated no molecular evidence of disease. Next Bmbx scheduled for after cycle 8.  - continue dasatinib while admitted  -Hemoglobin goal >7; PLT goal >10K if gum bleeding more significant and causing hypodynamic instability raise PLT threshold to 20K  - daily CBC w diff  - ppx: fluconazole, valtrex, and dapsone  - continue prednisolone eye drops     Hemorrhoids  Patient continues to report bright red blood per rectum.  Continue sitz baths.  Hydrocortisone cream twice daily.  Have also ordered oxy 5/10 panel PRN for moderate pain; and Dilaudid 1 mg p.o. every 4 hours for severe pain    Mucosal gum bleeding  Provoked iso thrombocytopenia.  -Amicar oral solution every hour as needed swish and spit     Immunocompromised status: Patient is immunocompromised secondary to ALL disease or chemotherapy  -Antimicrobial prophylaxis as above     Impending Electrolyte Abnormality Secondary to Chemotherapy and/or IV Fluids  -Daily Electrolyte monitoring  -Replete per [facility] guidelines.       Daily Checklist:  Diet: Regular Diet  DVT PPx: Contradindicated - Thrombocytopenic  Electrolytes: Replete Potassium to >/= 3.6 and Magnesium to >/= 1.8  Code Status: Full Code  Dispo: Home, earliest anticipated 7/31 if ANC rises and still afebrile but likely longer course IV abx while inpatient    Team Contact Information:   Primary Team: Hematology Resident ([facility])  Primary Resident: Marda Stalker, MD  Resident's Pager: [phone] (Hematology Senior Resident)    Interval History:   No acute events overnight.  Has remained afebrile over the past day. Continuing to have pain from her hemorrhoids but PO dilaudid has been helping. No further mucosal gum bleeding today. Her cough continues to improve.    All other systems were reviewed and are negative except as noted in the HPI    Objective:   Temp:  [36.7 ??C (98.1 ??F)-37 ??C (98.6 ??F)] 36.9 ??C (98.4 ??F)  Heart Rate:  [93-106] 96  Resp:  [16] 16  BP: (94-110)/(64-81) 110/78  SpO2:  [97 %-100 %] 98 %  General: Resting, lying in bed comfortably  HEENT: no further bleeding around gums; no visible oral ulcers  Heart:  RRR. S1, S2. No murmurs, gallops, or rubs.  Lungs:  Breathing is unlabored.  Skin: Warm and well-perfused  Neurologic:  Alert and oriented to person, place, time and situation.

## 2022-02-28 NOTE — Unmapped (Signed)
Completed care without difficulty, afebrile this shift. No changes to plan of care.        Problem: Adult Inpatient Plan of Care  Goal: Plan of Care Review  Outcome: Progressing  Goal: Patient-Specific Goal (Individualized)  Outcome: Progressing  Goal: Absence of Hospital-Acquired Illness or Injury  Outcome: Progressing  Intervention: Identify and Manage Fall Risk  Recent Flowsheet Documentation  Taken 02/28/2022 0705 by Regina Eck, RN  Safety Interventions:   lighting adjusted for tasks/safety   low bed   nonskid shoes/slippers when out of bed  Intervention: Prevent Infection  Recent Flowsheet Documentation  Taken 02/28/2022 0705 by Regina Eck, RN  Infection Prevention:   environmental surveillance performed   hand hygiene promoted  Goal: Optimal Comfort and Wellbeing  Outcome: Progressing  Goal: Readiness for Transition of Care  Outcome: Progressing  Goal: Rounds/Family Conference  Outcome: Progressing

## 2022-03-01 LAB — CBC W/ AUTO DIFF
BASOPHILS ABSOLUTE COUNT: 0 10*9/L (ref 0.0–0.1)
BASOPHILS RELATIVE PERCENT: 0 %
EOSINOPHILS ABSOLUTE COUNT: 0 10*9/L (ref 0.0–0.5)
EOSINOPHILS RELATIVE PERCENT: 5.9 %
HEMATOCRIT: 18 % — ABNORMAL LOW (ref 34.0–44.0)
HEMOGLOBIN: 6.5 g/dL — ABNORMAL LOW (ref 11.3–14.9)
LYMPHOCYTES ABSOLUTE COUNT: 0.3 10*9/L — ABNORMAL LOW (ref 1.1–3.6)
LYMPHOCYTES RELATIVE PERCENT: 91.3 %
MEAN CORPUSCULAR HEMOGLOBIN CONC: 36.3 g/dL — ABNORMAL HIGH (ref 32.0–36.0)
MEAN CORPUSCULAR HEMOGLOBIN: 33 pg — ABNORMAL HIGH (ref 25.9–32.4)
MEAN CORPUSCULAR VOLUME: 90.8 fL (ref 77.6–95.7)
MEAN PLATELET VOLUME: 6.7 fL — ABNORMAL LOW (ref 6.8–10.7)
MONOCYTES ABSOLUTE COUNT: 0 10*9/L — ABNORMAL LOW (ref 0.3–0.8)
MONOCYTES RELATIVE PERCENT: 2.6 %
NEUTROPHILS ABSOLUTE COUNT: 0 10*9/L — CL (ref 1.8–7.8)
NEUTROPHILS RELATIVE PERCENT: 0.2 %
PLATELET COUNT: 7 10*9/L — CL (ref 150–450)
RED BLOOD CELL COUNT: 1.99 10*12/L — ABNORMAL LOW (ref 3.95–5.13)
RED CELL DISTRIBUTION WIDTH: 19.4 % — ABNORMAL HIGH (ref 12.2–15.2)
WBC ADJUSTED: 0.3 10*9/L — CL (ref 3.6–11.2)

## 2022-03-01 LAB — PHOSPHORUS: PHOSPHORUS: 3.3 mg/dL (ref 2.4–5.1)

## 2022-03-01 LAB — BASIC METABOLIC PANEL
ANION GAP: 7 mmol/L (ref 5–14)
BLOOD UREA NITROGEN: 14 mg/dL (ref 9–23)
BUN / CREAT RATIO: 23
CALCIUM: 8.5 mg/dL — ABNORMAL LOW (ref 8.7–10.4)
CHLORIDE: 109 mmol/L — ABNORMAL HIGH (ref 98–107)
CO2: 25 mmol/L (ref 20.0–31.0)
CREATININE: 0.62 mg/dL
EGFR CKD-EPI (2021) FEMALE: >90 mL/min/{1.73_m2} (ref >=60)
GLUCOSE RANDOM: 105 mg/dL (ref 70–179)
POTASSIUM: 4.1 mmol/L (ref 3.4–4.8)
SODIUM: 141 mmol/L (ref 135–145)

## 2022-03-01 LAB — CBC
HEMATOCRIT: 21.6 % — ABNORMAL LOW (ref 34.0–44.0)
HEMOGLOBIN: 7.7 g/dL — ABNORMAL LOW (ref 11.3–14.9)
MEAN CORPUSCULAR HEMOGLOBIN CONC: 35.7 g/dL (ref 32.0–36.0)
MEAN CORPUSCULAR HEMOGLOBIN: 31.6 pg (ref 25.9–32.4)
MEAN CORPUSCULAR VOLUME: 88.3 fL (ref 77.6–95.7)
MEAN PLATELET VOLUME: 7.6 fL (ref 6.8–10.7)
PLATELET COUNT: 6 10*9/L — CL (ref 150–450)
RED BLOOD CELL COUNT: 2.45 10*12/L — ABNORMAL LOW (ref 3.95–5.13)
RED CELL DISTRIBUTION WIDTH: 19.8 % — ABNORMAL HIGH (ref 12.2–15.2)
WBC ADJUSTED: 0.3 10*9/L — CL (ref 3.6–11.2)

## 2022-03-01 LAB — HEPATIC FUNCTION PANEL
ALBUMIN: 3.4 g/dL (ref 3.4–5.0)
ALKALINE PHOSPHATASE: 60 U/L (ref 46–116)
ALT (SGPT): 104 U/L — ABNORMAL HIGH (ref 10–49)
AST (SGOT): 43 U/L — ABNORMAL HIGH (ref <=34)
BILIRUBIN DIRECT: 0.2 mg/dL (ref 0.00–0.30)
BILIRUBIN TOTAL: 0.6 mg/dL (ref 0.3–1.2)
PROTEIN TOTAL: 5.5 g/dL — ABNORMAL LOW (ref 5.7–8.2)

## 2022-03-01 LAB — PLATELET COUNT
PLATELET COUNT: 26 10*9/L — ABNORMAL LOW (ref 150–450)
PLATELET COUNT: 22 10*9/L — ABNORMAL LOW (ref 150–450)

## 2022-03-01 LAB — MAGNESIUM: MAGNESIUM: 1.8 mg/dL (ref 1.6–2.6)

## 2022-03-01 MED ADMIN — sodium chloride (NS) 0.9 % flush 10 mL: 10 mL | INTRAVENOUS

## 2022-03-01 MED ADMIN — dasatinib (SPRYCEL) tablet 70 mg: 70 mg | ORAL | @ 13:00:00

## 2022-03-01 MED ADMIN — sodium chloride (NS) 0.9 % infusion: INTRAVENOUS | @ 18:00:00

## 2022-03-01 MED ADMIN — HYDROmorphone (DILAUDID) tablet 1 mg: 1 mg | ORAL | @ 14:00:00 | Stop: 2022-03-02

## 2022-03-01 MED ADMIN — acetaminophen (TYLENOL) tablet 650 mg: 650 mg | ORAL | @ 22:00:00

## 2022-03-01 MED ADMIN — cefepime (MAXIPIME) 2 g in sodium chloride 0.9 % (NS) 100 mL IVPB-MBP: 2 g | INTRAVENOUS | @ 16:00:00 | Stop: 2022-03-01

## 2022-03-01 MED ADMIN — acetaminophen (TYLENOL) tablet 650 mg: 650 mg | ORAL | @ 09:00:00

## 2022-03-01 MED ADMIN — cetirizine (ZyrTEC) tablet 10 mg: 10 mg | ORAL | @ 13:00:00

## 2022-03-01 MED ADMIN — HYDROmorphone (DILAUDID) tablet 1 mg: 1 mg | ORAL | @ 09:00:00 | Stop: 2022-03-02

## 2022-03-01 MED ADMIN — fluconazole (DIFLUCAN) tablet 200 mg: 200 mg | ORAL | @ 13:00:00

## 2022-03-01 MED ADMIN — cefepime (MAXIPIME) 2 g in sodium chloride 0.9 % (NS) 100 mL IVPB-MBP: 2 g | INTRAVENOUS | @ 10:00:00 | Stop: 2022-03-01

## 2022-03-01 MED ADMIN — valACYclovir (VALTREX) tablet 500 mg: 500 mg | ORAL | @ 13:00:00

## 2022-03-01 MED ADMIN — sodium chloride (NS) 0.9 % flush 10 mL: 10 mL | INTRAVENOUS | @ 13:00:00

## 2022-03-01 NOTE — Unmapped (Signed)
Hematology Resident (MEDE) Progress Note    Assessment & Plan:   Patricia [name] is an 46 y.o. 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  Also recently with gum bleeding in the setting of thrombocytopenia and pain from hemorrhoids.    Active Problems:    B-cell acute lymphoblastic leukemia (ALL) (CMS-HCC)    Acute cough    Pancytopenia (CMS-HCC)    Febrile neutropenia (CMS-HCC)  Resolved Problems:    * No resolved hospital problems. *      Active Problems    Febrile Neutropenia  Febrile to 38.7 on arrival to infusion appointment 7/28.  Worsening cough and increased fatigue starting day prior to admission.  Recently discharged on 7/24 after starting C4 of HCVAD.  CT chest noted with left lower lobe nodular groundglass opacity which after discussions with Dr. Zenaida Niece could be PNA vs methotrexate related phenomenon.  Has improved on IV cefepime alone. In light of ANC recovery and possibility of CAP, will transition to empiric outpatient coverage with Augmentin   - Cefepime initiated 7/28-8/1 -> Augmentin 8/1-8/15  - MRSA nares screen negative - have discontinued vancomycin  - LRCx -collected 7/30, pending  - continue home ppx regimen as below     Ph+ALL in remission  Initially diagnosed in 09/2021. Please see H&P for full oncologic history. Started C4B of R-HyperCVAD/MA+dasatinib on 7/19 with IT cytarabine. Receiving IT once per cycle (methotrexate on odd cycles, cytarabine on even cycles). Post C3 Bmbx demonstrated no molecular evidence of disease. Next Bmbx scheduled for after cycle 8.  - continue dasatinib while admitted  -Hemoglobin goal >7; PLT goal >10K if gum bleeding more significant and causing hypodynamic instability raise PLT threshold to 20K  - daily CBC w diff  - ppx: fluconazole, valtrex, and dapsone  - continue prednisolone eye drops     External hemorrhoids  Patient continues to report bright red blood per rectum. Pain is no longer responsive to topicals. Discussed the risks/benefits of hemorrhoidectomy in light of IC status. Escalated regimen from sitz baths/HC cream to CCB topicals and kept Dilaudid 1 mg p.o. every 4 hours for severe pain.   - started CCB topical gel    Mucosal gum bleeding  Provoked iso thrombocytopenia.  -Amicar oral solution every hour as needed swish and spit     Immunocompromised status: Patient is immunocompromised secondary to ALL disease or chemotherapy  -Antimicrobial prophylaxis as above     Impending Electrolyte Abnormality Secondary to Chemotherapy and/or IV Fluids  -Daily Electrolyte monitoring  -Replete per [facility] guidelines.     Daily Checklist:  Diet: Regular Diet  DVT PPx: Contradindicated - Thrombocytopenic  Electrolytes: Replete Potassium to >/= 3.6 and Magnesium to >/= 1.8  Code Status: Full Code  Dispo: Home, earliest anticipated 7/31 if ANC rises and still afebrile but likely longer course IV abx while inpatient    Team Contact Information:   Primary Team: Hematology Resident (MEDE)  Primary Resident: Anders Simmonds, MD  Resident's Pager: [phone] (Hematology Senior Resident)    Interval History:   Received 2 units platelets on 8/1 (overnight and late AM). Denies any profuse gum bleeding, bloody stools, large hematomas, petechiae, or headaches. Does endorse worsening pain around known external hemorrhoid which is becoming progressively less responsive to topical pain regimen. She reports that her last bowel movement was several days ago, but that is her baseline. No significant discomfort from constipation/distension.     All  other systems were reviewed and are negative except as noted in the HPI    Objective:   Temp:  [36.8 ??C (98.2 ??F)-37.8 ??C (100 ??F)] 37.1 ??C (98.8 ??F)  Heart Rate:  [88-124] 98  Resp:  [16-20] 18  BP: (91-120)/(58-82) 112/82  SpO2:  [95 %-100 %] 100 %      General: Resting, lying in bed comfortably  HEENT: no further bleeding around gums; no visible oral ulcers  Heart: RRR. S1, S2. No murmurs, gallops, or rubs.  Lungs:  Breathing is unlabored.  Skin: Warm and well-perfused  Neurologic:  Alert and oriented to person, place, time and situation.   GU: single 1cm flesh colored external hemorrhoid protruding from the anal verge. No discoloration, thrombosis visualized

## 2022-03-02 LAB — CBC W/ AUTO DIFF
BASOPHILS ABSOLUTE COUNT: 0 10*9/L (ref 0.0–0.1)
BASOPHILS RELATIVE PERCENT: 0 %
EOSINOPHILS ABSOLUTE COUNT: 0 10*9/L (ref 0.0–0.5)
EOSINOPHILS RELATIVE PERCENT: 15.3 %
HEMATOCRIT: 19.7 % — ABNORMAL LOW (ref 34.0–44.0)
HEMOGLOBIN: 7.2 g/dL — ABNORMAL LOW (ref 11.3–14.9)
LYMPHOCYTES ABSOLUTE COUNT: 0.2 10*9/L — ABNORMAL LOW (ref 1.1–3.6)
LYMPHOCYTES RELATIVE PERCENT: 78.1 %
MEAN CORPUSCULAR HEMOGLOBIN CONC: 36.5 g/dL — ABNORMAL HIGH (ref 32.0–36.0)
MEAN CORPUSCULAR HEMOGLOBIN: 32 pg (ref 25.9–32.4)
MEAN CORPUSCULAR VOLUME: 87.6 fL (ref 77.6–95.7)
MEAN PLATELET VOLUME: 7.4 fL (ref 6.8–10.7)
MONOCYTES ABSOLUTE COUNT: 0 10*9/L — ABNORMAL LOW (ref 0.3–0.8)
MONOCYTES RELATIVE PERCENT: 5.7 %
NEUTROPHILS ABSOLUTE COUNT: 0 10*9/L — CL (ref 1.8–7.8)
NEUTROPHILS RELATIVE PERCENT: 0.9 %
PLATELET COUNT: 25 10*9/L — ABNORMAL LOW (ref 150–450)
RED BLOOD CELL COUNT: 2.25 10*12/L — ABNORMAL LOW (ref 3.95–5.13)
RED CELL DISTRIBUTION WIDTH: 19.3 % — ABNORMAL HIGH (ref 12.2–15.2)
WBC ADJUSTED: 0.2 10*9/L — CL (ref 3.6–11.2)

## 2022-03-02 LAB — URINALYSIS WITH MICROSCOPY
BACTERIA: NONE SEEN /HPF
BILIRUBIN UA: NEGATIVE
BLOOD UA: NEGATIVE
GLUCOSE UA: NEGATIVE
KETONES UA: NEGATIVE
LEUKOCYTE ESTERASE UA: NEGATIVE
NITRITE UA: NEGATIVE
PH UA: 6.5 (ref 5.0–9.0)
PROTEIN UA: NEGATIVE
RBC UA: 4 /HPF (ref <=4)
SPECIFIC GRAVITY UA: 1.013 (ref 1.003–1.030)
SQUAMOUS EPITHELIAL: <1 /HPF (ref 0–5)
UROBILINOGEN UA: <2
WBC UA: 1 /HPF (ref 0–5)

## 2022-03-02 LAB — PHOSPHORUS: PHOSPHORUS: 3.6 mg/dL (ref 2.4–5.1)

## 2022-03-02 LAB — HEPATIC FUNCTION PANEL
ALBUMIN: 3.4 g/dL (ref 3.4–5.0)
ALKALINE PHOSPHATASE: 65 U/L (ref 46–116)
ALT (SGPT): 94 U/L — ABNORMAL HIGH (ref 10–49)
AST (SGOT): 35 U/L — ABNORMAL HIGH (ref <=34)
BILIRUBIN DIRECT: 0.2 mg/dL (ref 0.00–0.30)
BILIRUBIN TOTAL: 0.8 mg/dL (ref 0.3–1.2)
PROTEIN TOTAL: 5.9 g/dL (ref 5.7–8.2)

## 2022-03-02 LAB — BASIC METABOLIC PANEL
ANION GAP: 8 mmol/L (ref 5–14)
BLOOD UREA NITROGEN: 15 mg/dL (ref 9–23)
BUN / CREAT RATIO: 22
CALCIUM: 8.8 mg/dL (ref 8.7–10.4)
CHLORIDE: 107 mmol/L (ref 98–107)
CO2: 27 mmol/L (ref 20.0–31.0)
CREATININE: 0.68 mg/dL
EGFR CKD-EPI (2021) FEMALE: >90 mL/min/{1.73_m2} (ref >=60)
GLUCOSE RANDOM: 104 mg/dL (ref 70–179)
POTASSIUM: 4 mmol/L (ref 3.4–4.8)
SODIUM: 142 mmol/L (ref 135–145)

## 2022-03-02 LAB — MAGNESIUM: MAGNESIUM: 1.9 mg/dL (ref 1.6–2.6)

## 2022-03-02 MED ADMIN — dapsone tablet 100 mg: 100 mg | ORAL | @ 01:00:00

## 2022-03-02 MED ADMIN — cetirizine (ZyrTEC) tablet 10 mg: 10 mg | ORAL | @ 13:00:00

## 2022-03-02 MED ADMIN — acetaminophen (TYLENOL) tablet 650 mg: 650 mg | ORAL | @ 08:00:00

## 2022-03-02 MED ADMIN — amoxicillin-clavulanate (AUGMENTIN) 875-125 mg per tablet 1 tablet: 1 | ORAL | @ 01:00:00 | Stop: 2022-03-05

## 2022-03-02 MED ADMIN — cefepime (MAXIPIME) 2 g in sodium chloride 0.9 % (NS) 100 mL IVPB-MBP: 2 g | INTRAVENOUS | @ 16:00:00 | Stop: 2022-03-05

## 2022-03-02 MED ADMIN — fluconazole (DIFLUCAN) tablet 200 mg: 200 mg | ORAL | @ 13:00:00

## 2022-03-02 MED ADMIN — alteplase (ACTIVASE) injection small catheter clearance 1 mg: 1 mg | INTRAVENOUS | @ 02:00:00 | Stop: 2022-03-01

## 2022-03-02 MED ADMIN — sodium chloride (NS) 0.9 % flush 10 mL: 10 mL | INTRAVENOUS | @ 14:00:00

## 2022-03-02 MED ADMIN — sodium chloride (NS) 0.9 % flush 10 mL: 10 mL | INTRAVENOUS | @ 01:00:00

## 2022-03-02 MED ADMIN — HYDROmorphone (DILAUDID) tablet 1 mg: 1 mg | ORAL | @ 13:00:00 | Stop: 2022-03-02

## 2022-03-02 MED ADMIN — dasatinib (SPRYCEL) tablet 70 mg: 70 mg | ORAL | @ 13:00:00

## 2022-03-02 MED ADMIN — valACYclovir (VALTREX) tablet 500 mg: 500 mg | ORAL | @ 13:00:00

## 2022-03-02 MED ADMIN — acetaminophen (TYLENOL) tablet 650 mg: 650 mg | ORAL | @ 13:00:00

## 2022-03-02 MED ADMIN — cefepime (MAXIPIME) 2 g in sodium chloride 0.9 % (NS) 100 mL IVPB-MBP: 2 g | INTRAVENOUS | @ 08:00:00 | Stop: 2022-03-05

## 2022-03-02 NOTE — Unmapped (Signed)
Pt alert and oriented. VSS, no falls and afebrile.  No acute events overnight. Pt states that she is not currently in pain.  Pt did not require any pain medications overnight.  Pt remains on room air with good results.  Safety measures remain in place with bed low, brakes locked, side rails up, call bell within reach, non-skid footwear while out of bed.  Will continue to monitor.    Vitals:    03/01/22 1440 03/01/22 1546 03/01/22 1942 03/01/22 2334   BP: 106/68 102/71 113/86 110/71   Pulse: 98 98 88 93   Resp: 18 16 16 16    Temp: 37.3 ??C (99.1 ??F) 37.1 ??C (98.8 ??F) 37.6 ??C (99.7 ??F) 37.1 ??C (98.8 ??F)   TempSrc: Oral  Oral Oral   SpO2: 98% 96% 98% 98%   Weight:            Problem: Adult Inpatient Plan of Care  Goal: Plan of Care Review  Outcome: Progressing  Flowsheets (Taken 03/02/2022 0316)  Progress: improving  Plan of Care Reviewed With: patient  Goal: Patient-Specific Goal (Individualized)  Outcome: Progressing  Flowsheets (Taken 03/02/2022 0316)  Patient-Specific Goals (Include Timeframe): Patient would like to remain afebrile overnight and go home tomorrow.  Individualized Care Needs: Cluster care, empathetic listening, presence, monitor labs and vitals.  Anxieties, Fears or Concerns: Patient is concerned about her hemorrhoid pain and keeping it under control.  Goal: Absence of Hospital-Acquired Illness or Injury  Outcome: Progressing  Intervention: Identify and Manage Fall Risk  Recent Flowsheet Documentation  Taken 03/01/2022 2149 by Weber Cooks, RN  Safety Interventions:   bleeding precautions   environmental modification   fall reduction program maintained   infection management   lighting adjusted for tasks/safety   low bed   neutropenic precautions   nonskid shoes/slippers when out of bed   room near unit station  Intervention: Prevent Skin Injury  Recent Flowsheet Documentation  Taken 03/01/2022 2149 by Weber Cooks, RN  Skin Protection:   adhesive use limited   cleansing with dimethicone incontinence wipes   protective footwear used   transparent dressing maintained   tubing/devices free from skin contact   zinc oxide barrier cream  Intervention: Prevent and Manage VTE (Venous Thromboembolism) Risk  Recent Flowsheet Documentation  Taken 03/01/2022 2149 by Weber Cooks, RN  VTE Prevention/Management:   ambulation promoted   bleeding precautions maintained   bleeding risk factors identified   dorsiflexion/plantar flexion performed   fluids promoted  Intervention: Prevent Infection  Recent Flowsheet Documentation  Taken 03/01/2022 2149 by Weber Cooks, RN  Infection Prevention:   cohorting utilized   hand hygiene promoted   personal protective equipment utilized   rest/sleep promoted   visitors restricted/screened   single patient room provided  Goal: Optimal Comfort and Wellbeing  Outcome: Progressing  Goal: Readiness for Transition of Care  Outcome: Progressing  Goal: Rounds/Family Conference  Outcome: Progressing     Problem: Infection  Goal: Absence of Infection Signs and Symptoms  Outcome: Progressing  Intervention: Prevent or Manage Infection  Recent Flowsheet Documentation  Taken 03/01/2022 2149 by Weber Cooks, RN  Infection Management: aseptic technique maintained  Isolation Precautions: protective precautions maintained

## 2022-03-03 LAB — HEPATIC FUNCTION PANEL
ALBUMIN: 3.4 g/dL (ref 3.4–5.0)
ALKALINE PHOSPHATASE: 63 U/L (ref 46–116)
ALT (SGPT): 77 U/L — ABNORMAL HIGH (ref 10–49)
AST (SGOT): 25 U/L (ref <=34)
BILIRUBIN DIRECT: 0.3 mg/dL (ref 0.00–0.30)
BILIRUBIN TOTAL: 0.7 mg/dL (ref 0.3–1.2)
PROTEIN TOTAL: 5.8 g/dL (ref 5.7–8.2)

## 2022-03-03 LAB — CBC W/ AUTO DIFF
BASOPHILS ABSOLUTE COUNT: 0 10*9/L (ref 0.0–0.1)
BASOPHILS RELATIVE PERCENT: 0 %
EOSINOPHILS ABSOLUTE COUNT: 0.1 10*9/L (ref 0.0–0.5)
EOSINOPHILS RELATIVE PERCENT: 23.4 %
HEMATOCRIT: 17.7 % — ABNORMAL LOW (ref 34.0–44.0)
HEMOGLOBIN: 6.4 g/dL — ABNORMAL LOW (ref 11.3–14.9)
LYMPHOCYTES ABSOLUTE COUNT: 0.2 10*9/L — ABNORMAL LOW (ref 1.1–3.6)
LYMPHOCYTES RELATIVE PERCENT: 63.2 %
MEAN CORPUSCULAR HEMOGLOBIN CONC: 36.1 g/dL — ABNORMAL HIGH (ref 32.0–36.0)
MEAN CORPUSCULAR HEMOGLOBIN: 31.5 pg (ref 25.9–32.4)
MEAN CORPUSCULAR VOLUME: 87.2 fL (ref 77.6–95.7)
MEAN PLATELET VOLUME: 7.2 fL (ref 6.8–10.7)
MONOCYTES ABSOLUTE COUNT: 0 10*9/L — ABNORMAL LOW (ref 0.3–0.8)
MONOCYTES RELATIVE PERCENT: 12.5 %
NEUTROPHILS ABSOLUTE COUNT: 0 10*9/L — CL (ref 1.8–7.8)
NEUTROPHILS RELATIVE PERCENT: 0.9 %
PLATELET COUNT: 14 10*9/L — ABNORMAL LOW (ref 150–450)
RED BLOOD CELL COUNT: 2.03 10*12/L — ABNORMAL LOW (ref 3.95–5.13)
RED CELL DISTRIBUTION WIDTH: 19.2 % — ABNORMAL HIGH (ref 12.2–15.2)
WBC ADJUSTED: 0.3 10*9/L — CL (ref 3.6–11.2)

## 2022-03-03 LAB — BASIC METABOLIC PANEL
ANION GAP: 6 mmol/L (ref 5–14)
BLOOD UREA NITROGEN: 15 mg/dL (ref 9–23)
BUN / CREAT RATIO: 26
CALCIUM: 9 mg/dL (ref 8.7–10.4)
CHLORIDE: 107 mmol/L (ref 98–107)
CO2: 25 mmol/L (ref 20.0–31.0)
CREATININE: 0.57 mg/dL — ABNORMAL LOW
EGFR CKD-EPI (2021) FEMALE: >90 mL/min/{1.73_m2} (ref >=60)
GLUCOSE RANDOM: 111 mg/dL (ref 70–179)
POTASSIUM: 3.7 mmol/L (ref 3.4–4.8)
SODIUM: 138 mmol/L (ref 135–145)

## 2022-03-03 LAB — MAGNESIUM: MAGNESIUM: 1.8 mg/dL (ref 1.6–2.6)

## 2022-03-03 LAB — PHOSPHORUS: PHOSPHORUS: 3.1 mg/dL (ref 2.4–5.1)

## 2022-03-03 MED ADMIN — cefepime (MAXIPIME) 2 g in sodium chloride 0.9 % (NS) 100 mL IVPB-MBP: 2 g | INTRAVENOUS | @ 16:00:00 | Stop: 2022-03-05

## 2022-03-03 MED ADMIN — sodium chloride (NS) 0.9 % flush 10 mL: 10 mL | INTRAVENOUS | @ 01:00:00

## 2022-03-03 MED ADMIN — cefepime (MAXIPIME) 2 g in sodium chloride 0.9 % (NS) 100 mL IVPB-MBP: 2 g | INTRAVENOUS | @ 09:00:00 | Stop: 2022-03-05

## 2022-03-03 MED ADMIN — carboxymethylcellulose sodium (THERATEARS) 0.25 % ophthalmic solution 2 drop: 2 [drp] | OPHTHALMIC | @ 16:00:00

## 2022-03-03 MED ADMIN — dasatinib (SPRYCEL) tablet 70 mg: 70 mg | ORAL | @ 12:00:00

## 2022-03-03 MED ADMIN — cefepime (MAXIPIME) 2 g in sodium chloride 0.9 % (NS) 100 mL IVPB-MBP: 2 g | INTRAVENOUS | @ 01:00:00 | Stop: 2022-03-05

## 2022-03-03 MED ADMIN — valACYclovir (VALTREX) tablet 500 mg: 500 mg | ORAL | @ 12:00:00

## 2022-03-03 MED ADMIN — oxyCODONE (ROXICODONE) immediate release tablet 5 mg: 5 mg | ORAL | @ 16:00:00 | Stop: 2022-03-12

## 2022-03-03 MED ADMIN — fluconazole (DIFLUCAN) tablet 200 mg: 200 mg | ORAL | @ 13:00:00

## 2022-03-03 MED ADMIN — dapsone tablet 100 mg: 100 mg | ORAL | @ 01:00:00

## 2022-03-03 MED ADMIN — cetirizine (ZyrTEC) tablet 10 mg: 10 mg | ORAL | @ 12:00:00

## 2022-03-03 MED ADMIN — oxyCODONE (ROXICODONE) immediate release tablet 5 mg: 5 mg | ORAL | @ 10:00:00 | Stop: 2022-03-12

## 2022-03-03 MED ADMIN — sodium chloride (NS) 0.9 % flush 10 mL: 10 mL | INTRAVENOUS | @ 12:00:00

## 2022-03-03 NOTE — Unmapped (Signed)
Hematology Resident (MEDE) Progress Note    Assessment & Plan:   Patricia [name] is an 46 y.o. 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  Also recently with gum bleeding in the setting of thrombocytopenia and pain from hemorrhoids.    Active Problems:    B-cell acute lymphoblastic leukemia (ALL) (CMS-HCC)    Acute cough    Pancytopenia (CMS-HCC)    Febrile neutropenia (CMS-HCC)  Resolved Problems:    * No resolved hospital problems. *      Active Problems    Febrile Neutropenia  Refevered to 39.3 on 8/2 after transition to Augmentin for presumed pneumonia. Cultures with NGTD, unremarkable U/A, CXR. Will continue broadened abx and monitor for afebrility >72h.   - Cefepime initiated 7/28-8/1 -> Augmentin 8/1-8/2 -> cefepime 8/2-  - MRSA nares screen negative - no vancomycin unless HDUS   - LRCx -collected 7/30, pending  - continue home ppx regimen as below  - could consider CT A/P if re-fevers in case of intra-abdominal/pelvic abscess      Ph+ALL in remission  Initially diagnosed in 09/2021. Please see H&P for full oncologic history. Started C4B of R-HyperCVAD/MA+dasatinib on 7/19 with IT cytarabine. Receiving IT once per cycle (methotrexate on odd cycles, cytarabine on even cycles). Post C3 Bmbx demonstrated no molecular evidence of disease. Next Bmbx scheduled for after cycle 8.  - continue dasatinib while admitted  -Hemoglobin goal >7; PLT goal >10K if gum bleeding more significant and causing hypodynamic instability raise PLT threshold to 20K  - daily CBC w diff  - ppx: fluconazole, valtrex, and dapsone  - continue prednisolone eye drops     External hemorrhoids  Patient continues to report bright red blood per rectum. Pain is no longer responsive to topicals. Discussed the risks/benefits of hemorrhoidectomy in light of IC status. Escalated regimen from sitz baths/HC cream to CCB topicals and kept Dilaudid 1 mg p.o. every 4 hours for severe pain. Will proceed with conservative management. May be exacerbated by nearby anal fissure, but no obvious signs of infection. Recommended continued attempts with sitz baths and topicals   - CCB topical gel with moderate effect, advised that surgical options difficult to recommend given IC status.     Mucosal gum bleeding  Provoked iso thrombocytopenia.  -Amicar oral solution every hour as needed swish and spit     Immunocompromised status: Patient is immunocompromised secondary to ALL disease or chemotherapy  -Antimicrobial prophylaxis as above     Impending Electrolyte Abnormality Secondary to Chemotherapy and/or IV Fluids  -Daily Electrolyte monitoring  -Replete per [facility] guidelines.     Daily Checklist:  Diet: Regular Diet  DVT PPx: Contradindicated - Thrombocytopenic  Electrolytes: Replete Potassium to >/= 3.6 and Magnesium to >/= 1.8  Code Status: Full Code  Dispo: Home, earliest anticipated 7/31 if ANC rises and still afebrile but likely longer course IV abx while inpatient    Team Contact Information:   Primary Team: Hematology Resident (MEDE)  Primary Resident: Anders Simmonds, MD  Resident's Pager: [phone] (Hematology Senior Resident)    Interval History:   Patient afebrile over last 24h. Feels much better this AM. Hemorrhoids are stably painful without topical use. Dilaudid only mildly helped. Is of a sharp quality, particularly exacerbated by straining and in a local distribution around the anus.     All other systems were reviewed and are negative except as noted in the HPI  Objective:   Temp:  [36.9 ??C (98.4 ??F)-37.8 ??C (100 ??F)] 36.9 ??C (98.4 ??F)  Heart Rate:  [92-110] 96  Resp:  [16-17] 16  BP: (92-107)/(61-81) 102/72  SpO2:  [93 %-99 %] 99 %      General: mild flush, lying in bed, some diaphoresis and shaking   HEENT: no further bleeding around gums; no visible oral ulcers  Heart:  RRR. S1, S2. No murmurs, gallops, or rubs.  Lungs:  Breathing is unlabored.  Skin: Warm and well-perfused  Neurologic:  Alert and oriented to person, place, time and situation.   GU: single 1cm flesh colored external hemorrhoid protruding from the anal verge. No discoloration, thrombosis visualized or new abscess or discoloration. Unable to visualize a bleeding fissure

## 2022-03-03 NOTE — Unmapped (Signed)
Hematology Resident (MEDE) Progress Note    Assessment & Plan:   Patricia [name] is an 46 y.o. 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  Also recently with gum bleeding in the setting of thrombocytopenia and pain from hemorrhoids.    Active Problems:    B-cell acute lymphoblastic leukemia (ALL) (CMS-HCC)    Acute cough    Pancytopenia (CMS-HCC)    Febrile neutropenia (CMS-HCC)  Resolved Problems:    * No resolved hospital problems. *      Active Problems    Febrile Neutropenia  Refevered to 39.3 on 8/2 after transition to Augmentin for presumed pneumonia. Cultures with NGTD, unremarkable U/A, CXR. Will continue broadened abx and monitor for afebrility >72h.   - Cefepime initiated 7/28-8/1 -> Augmentin 8/1-8/2 -> cefepime 8/2-  - MRSA nares screen negative - no vancomycin unless HDUS   - LRCx -collected 7/30, pending  - continue home ppx regimen as below     Ph+ALL in remission  Initially diagnosed in 09/2021. Please see H&P for full oncologic history. Started C4B of R-HyperCVAD/MA+dasatinib on 7/19 with IT cytarabine. Receiving IT once per cycle (methotrexate on odd cycles, cytarabine on even cycles). Post C3 Bmbx demonstrated no molecular evidence of disease. Next Bmbx scheduled for after cycle 8.  - continue dasatinib while admitted  -Hemoglobin goal >7; PLT goal >10K if gum bleeding more significant and causing hypodynamic instability raise PLT threshold to 20K  - daily CBC w diff  - ppx: fluconazole, valtrex, and dapsone  - continue prednisolone eye drops     External hemorrhoids  Patient continues to report bright red blood per rectum. Pain is no longer responsive to topicals. Discussed the risks/benefits of hemorrhoidectomy in light of IC status. Escalated regimen from sitz baths/HC cream to CCB topicals and kept Dilaudid 1 mg p.o. every 4 hours for severe pain. Will proceed with conservative management   - CCB topical gel with moderate effect, advised that surgical options difficult to recommend given IC status.     Mucosal gum bleeding  Provoked iso thrombocytopenia.  -Amicar oral solution every hour as needed swish and spit     Immunocompromised status: Patient is immunocompromised secondary to ALL disease or chemotherapy  -Antimicrobial prophylaxis as above     Impending Electrolyte Abnormality Secondary to Chemotherapy and/or IV Fluids  -Daily Electrolyte monitoring  -Replete per [facility] guidelines.     Daily Checklist:  Diet: Regular Diet  DVT PPx: Contradindicated - Thrombocytopenic  Electrolytes: Replete Potassium to >/= 3.6 and Magnesium to >/= 1.8  Code Status: Full Code  Dispo: Home, earliest anticipated 7/31 if ANC rises and still afebrile but likely longer course IV abx while inpatient    Team Contact Information:   Primary Team: Hematology Resident (MEDE)  Primary Resident: Anders Simmonds, MD  Resident's Pager: [phone] (Hematology Senior Resident)    Interval History:   Fevered to 39.6 at 0328 last night and and placed on cefepime. Patient is still feeling shaking chills and diffuse body pains. Requested that I turn up the temperature to 75. Denies any chest pain, SOB, new abdominal pain, skin pain, worsening anal pain. Did save stool sample, which she described as stable blood consistent with known hemorrhoids and well-formed.     All other systems were reviewed and are negative except as noted in the HPI    Objective:   Temp:  [36.9 ??C (98.4 ??F)-39.3 ??C (102.7 ??F)] 36.9 ??  C (98.4 ??F)  Heart Rate:  [88-140] 93  Resp:  [16-24] 16  BP: (93-134)/(64-94) 93/64  SpO2:  [92 %-100 %] 96 %      General: mild flush, lying in bed, some diaphoresis and shaking   HEENT: no further bleeding around gums; no visible oral ulcers  Heart:  RRR. S1, S2. No murmurs, gallops, or rubs.  Lungs:  Breathing is unlabored.  Skin: Warm and well-perfused  Neurologic:  Alert and oriented to person, place, time and situation.   GU: single 1cm flesh colored external hemorrhoid protruding from the anal verge. No discoloration, thrombosis visualized or new abscess or discoloration

## 2022-03-03 NOTE — Unmapped (Signed)
Pt continues to spike fevers, up to 38.8 today. Back on Cefapime IV. Pt still having blood from rectal hemorrhoids w/ bowel movements, but states it is improved from previously. Refusing hydrocortisone cream. Pt had 1 episode of emesis this morning; but declined medication. Tolerated meds and food after that. Will continue to monitor temps  Problem: Adult Inpatient Plan of Care  Goal: Plan of Care Review  Outcome: Ongoing - Unchanged  Goal: Patient-Specific Goal (Individualized)  Outcome: Ongoing - Unchanged  Goal: Absence of Hospital-Acquired Illness or Injury  Outcome: Ongoing - Unchanged  Intervention: Identify and Manage Fall Risk  Recent Flowsheet Documentation  Taken 03/02/2022 1615 by Annell Greening, RN  Safety Interventions:   fall reduction program maintained   family at bedside   low bed  Taken 03/02/2022 1420 by Annell Greening, RN  Safety Interventions:   low bed   family at bedside   fall reduction program maintained  Taken 03/02/2022 1155 by Annell Greening, RN  Safety Interventions:   low bed   fall reduction program maintained  Taken 03/02/2022 0920 by Annell Greening, RN  Safety Interventions:   fall reduction program maintained   low bed  Taken 03/02/2022 0720 by Annell Greening, RN  Safety Interventions:   fall reduction program maintained   low bed  Intervention: Prevent and Manage VTE (Venous Thromboembolism) Risk  Recent Flowsheet Documentation  Taken 03/02/2022 1615 by Annell Greening, RN  Activity Management: activity adjusted per tolerance  Taken 03/02/2022 1225 by Annell Greening, RN  Activity Management: activity adjusted per tolerance  Taken 03/02/2022 1155 by Annell Greening, RN  Activity Management: activity adjusted per tolerance  Taken 03/02/2022 0925 by Annell Greening, RN  VTE Prevention/Management: ambulation promoted  Taken 03/02/2022 0920 by Annell Greening, RN  Activity Management:   bedrest   activity adjusted per tolerance  Taken 03/02/2022 0720 by Annell Greening, RN  Activity Management: ambulated to bathroom  Intervention: Prevent Infection  Recent Flowsheet Documentation  Taken 03/02/2022 1615 by Annell Greening, RN  Infection Prevention: cohorting utilized  Taken 03/02/2022 1420 by Annell Greening, RN  Infection Prevention: cohorting utilized  Taken 03/02/2022 1155 by Annell Greening, RN  Infection Prevention: cohorting utilized  Taken 03/02/2022 0920 by Annell Greening, RN  Infection Prevention: cohorting utilized  Taken 03/02/2022 0720 by Annell Greening, RN  Infection Prevention: cohorting utilized  Goal: Optimal Comfort and Wellbeing  Outcome: Ongoing - Unchanged  Goal: Readiness for Transition of Care  Outcome: Ongoing - Unchanged  Goal: Rounds/Family Conference  Outcome: Ongoing - Unchanged     Problem: Infection  Goal: Absence of Infection Signs and Symptoms  Outcome: Ongoing - Unchanged  Intervention: Prevent or Manage Infection  Recent Flowsheet Documentation  Taken 03/02/2022 1615 by Annell Greening, RN  Infection Management: aseptic technique maintained  Isolation Precautions: protective precautions initiated  Taken 03/02/2022 1420 by Annell Greening, RN  Infection Management: aseptic technique maintained  Isolation Precautions: protective precautions maintained  Taken 03/02/2022 1155 by Annell Greening, RN  Infection Management: aseptic technique maintained  Isolation Precautions: protective precautions maintained  Taken 03/02/2022 0920 by Annell Greening, RN  Infection Management: aseptic technique maintained  Isolation Precautions: protective precautions maintained  Taken 03/02/2022 0720 by Annell Greening, RN  Infection Management: aseptic technique maintained  Isolation Precautions: protective precautions maintained

## 2022-03-03 NOTE — Unmapped (Signed)
Pt alert and oriented. VSS, no falls and afebrile.  No acute events overnight. Pt denies pain.  Pt remains on room air overnight.  Patient states that she feels as if the doctors are not listening to her regarding some of her ailments/concerns including her left middle toe, she would like a CT of her abdomen to make sure her gallbladder is ok because she spoke with her Mom and her Mom stated that she didn't have pain with her's either and is worried that she may have the same issues.  She is also upset as she feels she hasn't been heard regarding the treatment of her hemorrhoids.  Patient offered medications per [facility] but pt refused some.  Safety measures remain in place with bed low, brakes locked, side rails up, call bell within reach, non-skid footwear while out of bed.  Will continue to monitor.    Vitals:    03/02/22 1124 03/02/22 1208 03/02/22 1532 03/02/22 1947   BP: 110/72  93/64 103/72   Pulse: 119  93 110   Resp: 16  16 17    Temp: 37.3 ??C (99.1 ??F) 37.1 ??C (98.8 ??F) 36.9 ??C (98.4 ??F) 37.6 ??C (99.7 ??F)   TempSrc: Oral Oral Oral Oral   SpO2: 96% 95% 96% 98%   Weight:          Vitals:    03/02/22 2330 03/03/22 0552 03/03/22 0557 03/03/22 0622   BP: 97/65 98/71 102/81 92/61   Pulse: 107 103 106 92   Resp: 16 16 16 16    Temp: 37.8 ??C (100 ??F) 37 ??C (98.6 ??F) 37.3 ??C (99.1 ??F) 36.9 ??C (98.4 ??F)   TempSrc: Oral Oral Oral Oral   SpO2: 93% 99% 96% 95%   Weight:          Problem: Adult Inpatient Plan of Care  Goal: Plan of Care Review  03/02/2022 2254 by Weber Cooks, RN  Outcome: Progressing  Flowsheets (Taken 03/02/2022 2254)  Progress: improving  Plan of Care Reviewed With: patient  03/02/2022 2254 by Weber Cooks, RN  Outcome: Progressing  Flowsheets (Taken 03/02/2022 2254)  Progress: improving  Plan of Care Reviewed With: patient  Goal: Patient-Specific Goal (Individualized)  03/02/2022 2254 by Weber Cooks, RN  Outcome: Progressing  Flowsheets (Taken 03/02/2022 2254)  Patient-Specific Goals (Include Timeframe): Patient would like care clustered so she can get some rest overnight.  Individualized Care Needs: Cluster care, empathetic listening, presence, monitor labs and vitals, provide replacements and antibiotics per orders.  Anxieties, Fears or Concerns: Patient is concerned about her fever returning.  03/02/2022 2254 by Weber Cooks, RN  Outcome: Progressing  Flowsheets (Taken 03/02/2022 2254)  Patient-Specific Goals (Include Timeframe): Patient would like care clustered so she can get some rest overnight.  Individualized Care Needs: Cluster care, empathetic listening, presence, monitor labs and vitals, provide replacements and antibiotics per orders.  Anxieties, Fears or Concerns: Patient is concerned about her fever returning.  Goal: Absence of Hospital-Acquired Illness or Injury  03/02/2022 2254 by Weber Cooks, RN  Outcome: Progressing  03/02/2022 2254 by Weber Cooks, RN  Outcome: Progressing  Intervention: Identify and Manage Fall Risk  Recent Flowsheet Documentation  Taken 03/02/2022 2109 by Weber Cooks, RN  Safety Interventions:   bleeding precautions   environmental modification   fall reduction program maintained   infection management   lighting adjusted for tasks/safety   low bed   neutropenic precautions   nonskid shoes/slippers when out of bed  room near unit station  Intervention: Prevent Skin Injury  Recent Flowsheet Documentation  Taken 03/02/2022 2109 by Weber Cooks, RN  Skin Protection:   adhesive use limited   cleansing with dimethicone incontinence wipes   protective footwear used   transparent dressing maintained   tubing/devices free from skin contact  Intervention: Prevent and Manage VTE (Venous Thromboembolism) Risk  Recent Flowsheet Documentation  Taken 03/02/2022 2109 by Weber Cooks, RN  VTE Prevention/Management:   ambulation promoted   bleeding precautions maintained   bleeding risk factors identified   dorsiflexion/plantar flexion performed   fluids promoted  Intervention: Prevent Infection  Recent Flowsheet Documentation  Taken 03/02/2022 2109 by Weber Cooks, RN  Infection Prevention:   cohorting utilized   hand hygiene promoted   personal protective equipment utilized   rest/sleep promoted   single patient room provided   visitors restricted/screened  Goal: Optimal Comfort and Wellbeing  03/02/2022 2254 by Weber Cooks, RN  Outcome: Progressing  03/02/2022 2254 by Weber Cooks, RN  Outcome: Progressing  Goal: Readiness for Transition of Care  03/02/2022 2254 by Weber Cooks, RN  Outcome: Progressing  03/02/2022 2254 by Weber Cooks, RN  Outcome: Progressing  Goal: Rounds/Family Conference  03/02/2022 2254 by Weber Cooks, RN  Outcome: Progressing  03/02/2022 2254 by Weber Cooks, RN  Outcome: Progressing     Problem: Infection  Goal: Absence of Infection Signs and Symptoms  03/02/2022 2254 by Weber Cooks, RN  Outcome: Progressing  03/02/2022 2254 by Weber Cooks, RN  Outcome: Progressing  Intervention: Prevent or Manage Infection  Recent Flowsheet Documentation  Taken 03/02/2022 2109 by Weber Cooks, RN  Infection Management: aseptic technique maintained  Isolation Precautions: protective precautions maintained

## 2022-03-04 LAB — BASIC METABOLIC PANEL
ANION GAP: 7 mmol/L (ref 5–14)
BLOOD UREA NITROGEN: 14 mg/dL (ref 9–23)
BUN / CREAT RATIO: 24
CALCIUM: 9 mg/dL (ref 8.7–10.4)
CHLORIDE: 106 mmol/L (ref 98–107)
CO2: 27 mmol/L (ref 20.0–31.0)
CREATININE: 0.58 mg/dL — ABNORMAL LOW
EGFR CKD-EPI (2021) FEMALE: >90 mL/min/{1.73_m2} (ref >=60)
GLUCOSE RANDOM: 120 mg/dL (ref 70–179)
POTASSIUM: 3.8 mmol/L (ref 3.4–4.8)
SODIUM: 140 mmol/L (ref 135–145)

## 2022-03-04 LAB — CBC W/ AUTO DIFF
BASOPHILS ABSOLUTE COUNT: 0 10*9/L (ref 0.0–0.1)
BASOPHILS RELATIVE PERCENT: 0.2 %
EOSINOPHILS ABSOLUTE COUNT: 0.1 10*9/L (ref 0.0–0.5)
EOSINOPHILS RELATIVE PERCENT: 19.6 %
HEMATOCRIT: 19.8 % — ABNORMAL LOW (ref 34.0–44.0)
HEMOGLOBIN: 7.2 g/dL — ABNORMAL LOW (ref 11.3–14.9)
LYMPHOCYTES ABSOLUTE COUNT: 0.3 10*9/L — ABNORMAL LOW (ref 1.1–3.6)
LYMPHOCYTES RELATIVE PERCENT: 56.9 %
MEAN CORPUSCULAR HEMOGLOBIN CONC: 36.2 g/dL — ABNORMAL HIGH (ref 32.0–36.0)
MEAN CORPUSCULAR HEMOGLOBIN: 31.6 pg (ref 25.9–32.4)
MEAN CORPUSCULAR VOLUME: 87.1 fL (ref 77.6–95.7)
MEAN PLATELET VOLUME: 7.8 fL (ref 6.8–10.7)
MONOCYTES ABSOLUTE COUNT: 0.1 10*9/L — ABNORMAL LOW (ref 0.3–0.8)
MONOCYTES RELATIVE PERCENT: 22.3 %
NEUTROPHILS ABSOLUTE COUNT: 0 10*9/L — CL (ref 1.8–7.8)
NEUTROPHILS RELATIVE PERCENT: 1 %
PLATELET COUNT: 19 10*9/L — ABNORMAL LOW (ref 150–450)
RED BLOOD CELL COUNT: 2.27 10*12/L — ABNORMAL LOW (ref 3.95–5.13)
RED CELL DISTRIBUTION WIDTH: 17.8 % — ABNORMAL HIGH (ref 12.2–15.2)
WBC ADJUSTED: 0.5 10*9/L — ABNORMAL LOW (ref 3.6–11.2)

## 2022-03-04 LAB — HEPATIC FUNCTION PANEL
ALBUMIN: 3.3 g/dL — ABNORMAL LOW (ref 3.4–5.0)
ALKALINE PHOSPHATASE: 60 U/L (ref 46–116)
ALT (SGPT): 87 U/L — ABNORMAL HIGH (ref 10–49)
AST (SGOT): 35 U/L — ABNORMAL HIGH (ref <=34)
BILIRUBIN DIRECT: 0.2 mg/dL (ref 0.00–0.30)
BILIRUBIN TOTAL: 0.6 mg/dL (ref 0.3–1.2)
PROTEIN TOTAL: 5.7 g/dL (ref 5.7–8.2)

## 2022-03-04 LAB — PHOSPHORUS: PHOSPHORUS: 3.8 mg/dL (ref 2.4–5.1)

## 2022-03-04 LAB — SLIDE REVIEW

## 2022-03-04 LAB — MAGNESIUM: MAGNESIUM: 1.7 mg/dL (ref 1.6–2.6)

## 2022-03-04 MED ADMIN — dasatinib (SPRYCEL) tablet 70 mg: 70 mg | ORAL | @ 13:00:00

## 2022-03-04 MED ADMIN — cefepime (MAXIPIME) 2 g in sodium chloride 0.9 % (NS) 100 mL IVPB-MBP: 2 g | INTRAVENOUS | @ 07:00:00 | Stop: 2022-03-04

## 2022-03-04 MED ADMIN — dapsone tablet 100 mg: 100 mg | ORAL | @ 02:00:00

## 2022-03-04 MED ADMIN — sodium chloride (NS) 0.9 % flush 10 mL: 10 mL | INTRAVENOUS | @ 13:00:00

## 2022-03-04 MED ADMIN — cefepime (MAXIPIME) 2 g in sodium chloride 0.9 % (NS) 100 mL IVPB-MBP: 2 g | INTRAVENOUS | @ 02:00:00 | Stop: 2022-03-05

## 2022-03-04 MED ADMIN — cefepime (MAXIPIME) 2 g in sodium chloride 0.9 % (NS) 100 mL IVPB-MBP: 2 g | INTRAVENOUS | @ 16:00:00 | Stop: 2022-03-04

## 2022-03-04 MED ADMIN — cetirizine (ZyrTEC) tablet 10 mg: 10 mg | ORAL | @ 13:00:00

## 2022-03-04 MED ADMIN — oxyCODONE (ROXICODONE) immediate release tablet 5 mg: 5 mg | ORAL | @ 23:00:00 | Stop: 2022-03-12

## 2022-03-04 MED ADMIN — sodium chloride (NS) 0.9 % flush 10 mL: 10 mL | INTRAVENOUS | @ 02:00:00

## 2022-03-04 MED ADMIN — fluconazole (DIFLUCAN) tablet 200 mg: 200 mg | ORAL | @ 13:00:00

## 2022-03-04 MED ADMIN — valACYclovir (VALTREX) tablet 500 mg: 500 mg | ORAL | @ 13:00:00

## 2022-03-04 MED ADMIN — iohexoL (OMNIPAQUE) 350 mg iodine/mL solution 100 mL: 100 mL | INTRAVENOUS | @ 10:00:00 | Stop: 2022-03-04

## 2022-03-04 MED ADMIN — oxyCODONE (ROXICODONE) immediate release tablet 5 mg: 5 mg | ORAL | @ 02:00:00 | Stop: 2022-03-12

## 2022-03-04 MED ADMIN — oxyCODONE (ROXICODONE) immediate release tablet 5 mg: 5 mg | ORAL | @ 17:00:00 | Stop: 2022-03-12

## 2022-03-04 NOTE — Unmapped (Incomplete)
Pt alert and oriented. VSS, no falls and afebrile.  No acute events overnight. Pt complained of hemorrhoidal pain and received PRN 5 mg oxy x 1 with good results.  Pt remains on room air with good results.  Patient expressed that she was glad the doctors decided to do the CT as she is concerned something else is going on in her abdominal region that is causing her to fever.  Safety measures remain in place with bed low, brakes locked, side rails up, call bell within reach, non-skid footwear while out of bed.  Will continue to monitor.    Vitals:    03/03/22 1550 03/03/22 1810 03/03/22 1944 03/04/22 0008   BP: 90/60 (P) 101/66 94/66 96/74    Pulse: 88  96 97   Resp: 16  17 16    Temp: 36.8 ??C (98.2 ??F)  37.2 ??C (99 ??F) 37.1 ??C (98.8 ??F)   TempSrc:   Oral Oral   SpO2: 97%  100% 100%   Weight:            Problem: Adult Inpatient Plan of Care  Goal: Plan of Care Review  Outcome: Progressing  Flowsheets (Taken 03/04/2022 0308)  Progress: improving  Plan of Care Reviewed With: patient  Goal: Patient-Specific Goal (Individualized)  Outcome: Progressing  Flowsheets (Taken 03/04/2022 0308)  Patient-Specific Goals (Include Timeframe): Patient would like care clustered so she can get some rest overnight.  Individualized Care Needs: Cluster care, empathetic listening, presence, monitor labs and vitals, provide replacements as needed per [facility]  Anxieties, Fears or Concerns: Patient is concerned about why she keeps getting fevers and her hemorrhoids.  Goal: Absence of Hospital-Acquired Illness or Injury  Outcome: Progressing  Intervention: Identify and Manage Fall Risk  Recent Flowsheet Documentation  Taken 03/03/2022 2135 by Weber Cooks, RN  Safety Interventions:   bleeding precautions   environmental modification   fall reduction program maintained   infection management   lighting adjusted for tasks/safety   low bed   neutropenic precautions   nonskid shoes/slippers when out of bed   room near unit station  Intervention: Prevent Skin Injury  Recent Flowsheet Documentation  Taken 03/03/2022 2135 by Weber Cooks, RN  Skin Protection:   adhesive use limited   protective footwear used   transparent dressing maintained   tubing/devices free from skin contact  Intervention: Prevent and Manage VTE (Venous Thromboembolism) Risk  Recent Flowsheet Documentation  Taken 03/03/2022 2135 by Weber Cooks, RN  VTE Prevention/Management:   ambulation promoted   bleeding precautions maintained   bleeding risk factors identified   dorsiflexion/plantar flexion performed   fluids promoted  Intervention: Prevent Infection  Recent Flowsheet Documentation  Taken 03/03/2022 2135 by Weber Cooks, RN  Infection Prevention:   cohorting utilized   hand hygiene promoted   personal protective equipment utilized   rest/sleep promoted   single patient room provided   visitors restricted/screened  Goal: Optimal Comfort and Wellbeing  Outcome: Progressing  Goal: Readiness for Transition of Care  Outcome: Progressing  Goal: Rounds/Family Conference  Outcome: Progressing     Problem: Infection  Goal: Absence of Infection Signs and Symptoms  Outcome: Progressing  Intervention: Prevent or Manage Infection  Recent Flowsheet Documentation  Taken 03/03/2022 2135 by Weber Cooks, RN  Infection Management: aseptic technique maintained  Isolation Precautions: protective precautions maintained

## 2022-03-04 NOTE — Unmapped (Signed)
Hypotensive once during shift but patient was lying down. Rechecked later and normal. Other VSS. Afebrile during shift. PRN oxy given once for hemorrhoid pain. Port dressing changed d/t lifting after shower-- pt requested not to change needle yet in case of discharge. No nausea or reports of other discomfort. IV ABX continued. CHG and bath completed and linen changed. Neutropenic/bleeding precautions maintained.     Problem: Adult Inpatient Plan of Care  Goal: Plan of Care Review  Outcome: Progressing  Goal: Patient-Specific Goal (Individualized)  Outcome: Progressing  Goal: Absence of Hospital-Acquired Illness or Injury  Outcome: Progressing  Intervention: Identify and Manage Fall Risk  Recent Flowsheet Documentation  Taken 03/03/2022 1216 by Vaughan Browner, RN  Safety Interventions: chemotherapeutic agent precautions  Taken 03/03/2022 1016 by Vaughan Browner, RN  Safety Interventions:   chemotherapeutic agent precautions   bleeding precautions   low bed   lighting adjusted for tasks/safety   nonskid shoes/slippers when out of bed  Taken 03/03/2022 0812 by Vaughan Browner, RN  Safety Interventions:   bleeding precautions   lighting adjusted for tasks/safety   low bed   nonskid shoes/slippers when out of bed  Intervention: Prevent Skin Injury  Recent Flowsheet Documentation  Taken 03/03/2022 0812 by Vaughan Browner, RN  Skin Protection: adhesive use limited  Intervention: Prevent and Manage VTE (Venous Thromboembolism) Risk  Recent Flowsheet Documentation  Taken 03/03/2022 1610 by Vaughan Browner, RN  Activity Management: activity adjusted per tolerance  VTE Prevention/Management:   ambulation promoted   bleeding risk factors identified  Intervention: Prevent Infection  Recent Flowsheet Documentation  Taken 03/03/2022 1016 by Vaughan Browner, RN  Infection Prevention: cohorting utilized  Taken 03/03/2022 0812 by Vaughan Browner, RN  Infection Prevention: cohorting utilized  Goal: Optimal Comfort and Wellbeing  Outcome: Progressing  Goal: Readiness for Transition of Care  Outcome: Progressing  Goal: Rounds/Family Conference  Outcome: Progressing

## 2022-03-05 LAB — BASIC METABOLIC PANEL
ANION GAP: 7 mmol/L (ref 5–14)
BLOOD UREA NITROGEN: 14 mg/dL (ref 9–23)
BUN / CREAT RATIO: 20
CALCIUM: 8.8 mg/dL (ref 8.7–10.4)
CHLORIDE: 106 mmol/L (ref 98–107)
CO2: 26 mmol/L (ref 20.0–31.0)
CREATININE: 0.69 mg/dL
EGFR CKD-EPI (2021) FEMALE: >90 mL/min/{1.73_m2} (ref >=60)
GLUCOSE RANDOM: 120 mg/dL (ref 70–179)
POTASSIUM: 3.7 mmol/L (ref 3.4–4.8)
SODIUM: 139 mmol/L (ref 135–145)

## 2022-03-05 LAB — CBC W/ AUTO DIFF
BASOPHILS ABSOLUTE COUNT: 0 10*9/L (ref 0.0–0.1)
BASOPHILS RELATIVE PERCENT: 0 %
EOSINOPHILS ABSOLUTE COUNT: 0.1 10*9/L (ref 0.0–0.5)
EOSINOPHILS RELATIVE PERCENT: 8.2 %
HEMATOCRIT: 19.7 % — ABNORMAL LOW (ref 34.0–44.0)
HEMOGLOBIN: 7.2 g/dL — ABNORMAL LOW (ref 11.3–14.9)
LYMPHOCYTES ABSOLUTE COUNT: 0.5 10*9/L — ABNORMAL LOW (ref 1.1–3.6)
LYMPHOCYTES RELATIVE PERCENT: 50.6 %
MEAN CORPUSCULAR HEMOGLOBIN CONC: 36.4 g/dL — ABNORMAL HIGH (ref 32.0–36.0)
MEAN CORPUSCULAR HEMOGLOBIN: 31.8 pg (ref 25.9–32.4)
MEAN CORPUSCULAR VOLUME: 87.5 fL (ref 77.6–95.7)
MEAN PLATELET VOLUME: 8.1 fL (ref 6.8–10.7)
MONOCYTES ABSOLUTE COUNT: 0.4 10*9/L (ref 0.3–0.8)
MONOCYTES RELATIVE PERCENT: 40.8 %
NEUTROPHILS ABSOLUTE COUNT: 0 10*9/L — CL (ref 1.8–7.8)
NEUTROPHILS RELATIVE PERCENT: 0.4 %
PLATELET COUNT: 39 10*9/L — ABNORMAL LOW (ref 150–450)
RED BLOOD CELL COUNT: 2.26 10*12/L — ABNORMAL LOW (ref 3.95–5.13)
RED CELL DISTRIBUTION WIDTH: 17.7 % — ABNORMAL HIGH (ref 12.2–15.2)
WBC ADJUSTED: 1 10*9/L — ABNORMAL LOW (ref 3.6–11.2)

## 2022-03-05 LAB — URINALYSIS WITH MICROSCOPY
BACTERIA: NONE SEEN /HPF
BILIRUBIN UA: NEGATIVE
BLOOD UA: NEGATIVE
GLUCOSE UA: NEGATIVE
KETONES UA: NEGATIVE
LEUKOCYTE ESTERASE UA: NEGATIVE
NITRITE UA: NEGATIVE
PH UA: 6.5 (ref 5.0–9.0)
PROTEIN UA: 50 — AB
RBC UA: 1 /HPF (ref <=4)
SPECIFIC GRAVITY UA: 1.02 (ref 1.003–1.030)
SQUAMOUS EPITHELIAL: <1 /HPF (ref 0–5)
TRANSITIONAL EPITHELIAL: <1 /HPF (ref 0–2)
UROBILINOGEN UA: <2
WBC UA: 2 /HPF (ref 0–5)

## 2022-03-05 LAB — HEPATIC FUNCTION PANEL
ALBUMIN: 3.3 g/dL — ABNORMAL LOW (ref 3.4–5.0)
ALKALINE PHOSPHATASE: 63 U/L (ref 46–116)
ALT (SGPT): 82 U/L — ABNORMAL HIGH (ref 10–49)
AST (SGOT): 31 U/L (ref <=34)
BILIRUBIN DIRECT: 0.2 mg/dL (ref 0.00–0.30)
BILIRUBIN TOTAL: 0.7 mg/dL (ref 0.3–1.2)
PROTEIN TOTAL: 5.7 g/dL (ref 5.7–8.2)

## 2022-03-05 LAB — PHOSPHORUS: PHOSPHORUS: 4.3 mg/dL (ref 2.4–5.1)

## 2022-03-05 LAB — SLIDE REVIEW

## 2022-03-05 LAB — MAGNESIUM: MAGNESIUM: 1.7 mg/dL (ref 1.6–2.6)

## 2022-03-05 MED ADMIN — sodium chloride (NS) 0.9 % flush 10 mL: 10 mL | INTRAVENOUS | @ 14:00:00

## 2022-03-05 MED ADMIN — cefepime (MAXIPIME) 2 g in sodium chloride 0.9 % (NS) 100 mL IVPB-MBP: 2 g | INTRAVENOUS | Stop: 2022-03-04

## 2022-03-05 MED ADMIN — dapsone tablet 100 mg: 100 mg | ORAL

## 2022-03-05 MED ADMIN — acetaminophen (TYLENOL) tablet 650 mg: 650 mg | ORAL | @ 16:00:00

## 2022-03-05 MED ADMIN — levoFLOXacin (LEVAQUIN) tablet 500 mg: 500 mg | ORAL | @ 14:00:00 | Stop: 2022-03-05

## 2022-03-05 MED ADMIN — cefepime (MAXIPIME) 2 g in sodium chloride 0.9 % (NS) 100 mL IVPB-MBP: 2 g | INTRAVENOUS | @ 18:00:00 | Stop: 2022-03-08

## 2022-03-05 MED ADMIN — oxyCODONE (ROXICODONE) immediate release tablet 5 mg: 5 mg | ORAL | @ 05:00:00 | Stop: 2022-03-12

## 2022-03-05 MED ADMIN — dasatinib (SPRYCEL) tablet 70 mg: 70 mg | ORAL | @ 13:00:00

## 2022-03-05 MED ADMIN — fluconazole (DIFLUCAN) tablet 200 mg: 200 mg | ORAL | @ 14:00:00

## 2022-03-05 MED ADMIN — cetirizine (ZyrTEC) tablet 10 mg: 10 mg | ORAL | @ 14:00:00

## 2022-03-05 MED ADMIN — valACYclovir (VALTREX) tablet 500 mg: 500 mg | ORAL | @ 14:00:00

## 2022-03-05 MED ADMIN — sodium chloride (NS) 0.9 % flush 10 mL: 10 mL | INTRAVENOUS

## 2022-03-05 MED ADMIN — oxyCODONE (ROXICODONE) immediate release tablet 5 mg: 5 mg | ORAL | @ 09:00:00 | Stop: 2022-03-12

## 2022-03-05 NOTE — Unmapped (Signed)
Daily Progress Note      Active Problems:    B-cell acute lymphoblastic leukemia (ALL) (CMS-HCC)    Acute cough    Pancytopenia (CMS-HCC)    Febrile neutropenia (CMS-HCC)       LOS: 7 days     Assessment/Plan:  Neutropenic Fever of Unclear Etiology  Work notable for [employer] x2 (8/2) NG 48h, UCx NG, CTAP only with new complex cystic appearance of cervix but no other intraabdominal pathology. CT chest (7/29) with mild centrilobular nodular GGO in LLL.  ?? Last febrile 8/2 10am  ?? Antibiotics: cefepime/vancomycin (7/28), cefepime (7/29-8/4) for total 7 days. Plan to monitor off antibiotics on 8/5 and discharge if afebrile on 8/6.  ?? Ambulatory referral to Ob-Gyn at discharge    External Hemorrhoids  Severely painful hemorrhoids. Does not want to trial miralax as it causes bloating or senna because it causes profound diarrhea.  ?? Bowel regimen: apple juice?, miralax PRN  ?? Pain: lidocaine/phenylephrine cream TID, discontinued scheduled hydrocortisone cream as refusing. Regularly using oxycodone 5mg  PRN.  ?? 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  ?? Transfuse for Hb > 7 and Plt > 10  ?? Dasatinib 70mg  daily  ?? PPx: dapsone 100mg , fluconazole 200mg , valtrex 500mg , started levaquin 500mg  daily    Subjective:    Patricia [name] was feeling well this morning. She has significant hemorrhoidal pain which is best relived by the lidocaine-phenylephrine cream and oxycodone 5mg . She doesn't like the idea of taking miralax as it causes bloating or senna as it caused severe diarrhea in the past.     Objective:     Vital signs in last 24 hours:  Temp:  [36.9 ??C (98.4 ??F)-37.3 ??C (99.1 ??F)] 37.3 ??C (99.1 ??F)  Heart Rate:  [93-110] 107  Resp:  [16-18] 16  BP: (96-119)/(67-87) 115/81  MAP (mmHg):  [80-96] 96  SpO2:  [97 %-100 %] 97 %  BMI (Calculated):  [28.43] 28.43    Intake/Output last 3 shifts:  I/O last 3 completed shifts:  In: 1116.7 [P.O.:840; I.V.:10; Blood:266.7]  Out: -     Meds:  Current Facility-Administered Medications   Medication Dose Route Frequency Provider Last Rate Last Admin   ??? acetaminophen (TYLENOL) tablet 650 mg  650 mg Oral Q8H PRN Marda Stalker, MD   650 mg at 03/02/22 1610   ??? albuterol (PROVENTIL HFA;VENTOLIN HFA) 90 mcg/actuation inhaler 2 puff  2 puff Inhalation Q6H PRN Marda Stalker, MD   2 puff at 02/26/22 1639   ??? aminocaproic acid (AMICAR) oral solution  1 g Oral Q1H PRN Barnett Abu, MD       ??? bismuth subsalicylate (PEPTO-BISMOL) oral suspension  30 mL Oral Q6H PRN Marda Stalker, MD       ??? carboxymethylcellulose sodium (THERATEARS) 0.25 % ophthalmic solution 2 drop  2 drop Both Eyes QID PRN Anders Simmonds, MD   2 drop at 03/03/22 1134   ??? cefepime (MAXIPIME) 2 g in sodium chloride 0.9 % (NS) 100 mL IVPB-MBP  2 g Intravenous Q8H Barnett Abu, MD 200 mL/hr at 03/04/22 2027 2 g at 03/04/22 2027   ??? cetirizine (ZyrTEC) tablet 10 mg  10 mg Oral Daily Marda Stalker, MD   10 mg at 03/04/22 0902   ??? dapsone tablet 100 mg  100 mg Oral Daily Marda Stalker, MD   100 mg at 03/04/22  2023   ??? dasatinib (SPRYCEL) tablet 70 mg  70 mg Oral Daily Halford Decamp, MD   70 mg at 03/04/22 4696   ??? dibucaine (NUPERCAINAL) 1 % ointment   Topical Q2H PRN Jeannene Patella, MD       ??? emollient combination no.92 (LUBRIDERM) lotion 1 application.  1 application. Topical Q1H PRN Marda Stalker, MD       ??? fluconazole (DIFLUCAN) tablet 200 mg  200 mg Oral Daily Marda Stalker, MD   200 mg at 03/04/22 0902   ??? hydrocortisone 2.5 % cream   Topical BID Jeannene Patella, MD       ??? IP OKAY TO TREAT   Other Continuous PRN Halford Decamp, MD       ??? levalbuterol Pauline Aus) nebulizer solution 0.31 mg  1 ampule Nebulization Q4H PRN Marda Stalker, MD       ??? loperamide (IMODIUM) capsule 2 mg  2 mg Oral Q2H PRN Marda Stalker, MD       ??? loperamide (IMODIUM) capsule 4 mg  4 mg Oral Once PRN Marda Stalker, MD       ??? NIFEdipine-lidocaine 0.3%-1.5% in petrolatum ointment   Topical Q6H PRN Anders Simmonds, MD       ??? oxyCODONE (ROXICODONE) immediate release tablet 5 mg  5 mg Oral Q4H PRN Marda Stalker, MD   5 mg at 03/04/22 1909    Or   ??? oxyCODONE (ROXICODONE) immediate release tablet 10 mg  10 mg Oral Q4H PRN Marda Stalker, MD   10 mg at 02/26/22 2101   ??? polyethylene glycol (MIRALAX) packet 17 g  17 g Oral BID Hiram Gash, MD       ??? prochlorperazine (COMPAZINE) tablet 10 mg  10 mg Oral Q6H PRN Marda Stalker, MD       ??? senna (SENOKOT) tablet 2 tablet  2 tablet Oral Nightly Hiram Gash, MD       ??? sodium chloride (NS) 0.9 % flush 10 mL  10 mL Intravenous BID Marda Stalker, MD   10 mL at 03/04/22 2024   ??? sodium chloride (NS) 0.9 % infusion   Intravenous Continuous Anders Simmonds, MD   50 mL at 03/01/22 1405   ??? valACYclovir (VALTREX) tablet 500 mg  500 mg Oral Daily Halford Decamp, MD   500 mg at 03/04/22 0902       Physical Exam:  General: pleasant, well-appearing, female, NAD, answering questions appropriately  HEENT: normocephalic, atraumatic  Eyes: EOMI  Cardiovascular: extremities warm and well perfused, 3+ radial pulses, no peripheral edema  Pulmonary: normal respiratory effort, symmetric chest excursion  Gastrointestinal: soft, NTND  Musculoskeletal: normal muscle tone  Skin: no lesions on clothed exam      Labs:  Recent Labs     03/02/22  0013 03/03/22  0102 03/04/22  0003   WBC 0.2* 0.3* 0.5*   NEUTROABS 0.0* 0.0* 0.0*   LYMPHSABS 0.2* 0.2* 0.3*   HGB 7.2* 6.4* 7.2*   HCT 19.7* 17.7* 19.8*   PLT 25* 14* 19*   CREATININE 0.68 0.57* 0.58*   BUN 15 15 14    BILITOT 0.8 0.7 0.6   BILIDIR 0.20 0.30 0.20   AST 35* 25 35*   ALT 94* 77* 87*   ALKPHOS 65 63 60   K 4.0 3.7 3.8   MG 1.9 1.8 1.7   CALCIUM 8.8 9.0 9.0   NA  142 138 140   CL 107 107 106   CO2 27.0 25.0 27.0   PHOS 3.6 3.1 3.8       Imaging:  No new imaging    Hiram Gash, MD  03/04/2022  8:44 PM   Hematology/Oncology Department   [facility]   Group pager: [phone]

## 2022-03-05 NOTE — Unmapped (Signed)
Daily Progress Note      Active Problems:    B-cell acute lymphoblastic leukemia (ALL) (CMS-HCC)    Acute cough    Pancytopenia (CMS-HCC)    Febrile neutropenia (CMS-HCC)       LOS: 8 days     Assessment/Plan:  Neutropenic Fever of Unclear Etiology  Work notable for [employer] x2 (8/2) NG 48h, UCx NG, CTAP only with new complex cystic appearance of cervix but no other intraabdominal pathology. CT chest (7/29) with mild centrilobular nodular GGO in LLL.  Last febrile 8/2 10am  Antibiotics: cefepime/vancomycin (7/28), cefepime (7/29-8/4) for total 7 days. Plan to monitor off antibiotics on 8/5 and discharge if afebrile on 8/6.  Ambulatory referral to Ob-Gyn at discharge    External Hemorrhoids  Severely painful hemorrhoids with possible associated fissure. Does not want to trial miralax as it causes bloating or senna because it causes profound diarrhea. Counseled to maintain fibrous diet and hydration to soften stools.   Bowel regimen: apple juice?, miralax PRN  Pain: lidocaine/CCB and phenylepiphrine cream TID. 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  Transfuse for Hb > 7 and Plt > 10  Dasatinib 70mg  daily  PPx: dapsone 100mg , fluconazole 200mg , valtrex 500mg , levaquin 500mg  daily    Subjective:    Patient has no new complaints. Curious about her ANC, which has not resulted. Is using her home Dr. Silvana Newness cream in lieu of inpatient cream. Had 1 soft well formed BM yesterday, after which she had anal pain (thought to be r/t to fissure). She is currently controlling the pain with limited oxycodone (4, 5mg  in lst 24h). No PRN miralax usage this admission.      Objective:     Vital signs in last 24 hours:  Temp:  [37 ??C (98.6 ??F)-39.2 ??C (102.6 ??F)] 39.2 ??C (102.6 ??F)  Heart Rate:  [101-120] 101  Resp:  [16] 16  BP: (100-119)/(63-81) 105/74  MAP (mmHg):  [73-85] 85  SpO2:  [93 %-98 %] 98 %    Intake/Output last 3 shifts:  I/O last 3 completed shifts:  In: 748 [P.O.:600; I.V.:148]  Out: -     Meds:  Current Facility-Administered Medications   Medication Dose Route Frequency Provider Last Rate Last Admin    acetaminophen (TYLENOL) tablet 650 mg  650 mg Oral Q8H PRN Marda Stalker, MD   650 mg at 03/05/22 1214    albuterol (PROVENTIL HFA;VENTOLIN HFA) 90 mcg/actuation inhaler 2 puff  2 puff Inhalation Q6H PRN Marda Stalker, MD   2 puff at 02/26/22 1639    aminocaproic acid (AMICAR) oral solution  1 g Oral Q1H PRN Barnett Abu, MD        bismuth subsalicylate (PEPTO-BISMOL) oral suspension  30 mL Oral Q6H PRN Marda Stalker, MD        carboxymethylcellulose sodium (THERATEARS) 0.25 % ophthalmic solution 2 drop  2 drop Both Eyes QID PRN Anders Simmonds, MD   2 drop at 03/03/22 1134    cetirizine (ZyrTEC) tablet 10 mg  10 mg Oral Daily Marda Stalker, MD   10 mg at 03/05/22 0930    dapsone tablet 100 mg  100 mg Oral Daily Marda Stalker, MD   100 mg at 03/04/22 2023    dasatinib (SPRYCEL) tablet 70 mg  70 mg Oral Daily Prescilla Sours  Leotis Pain, MD   70 mg at 03/05/22 0928    dibucaine (NUPERCAINAL) 1 % ointment   Topical Q2H PRN Jeannene Patella, MD        emollient combination no.92 (LUBRIDERM) lotion 1 application.  1 application. Topical Q1H PRN Marda Stalker, MD        fluconazole (DIFLUCAN) tablet 200 mg  200 mg Oral Daily Marda Stalker, MD   200 mg at 03/05/22 0931    IP OKAY TO TREAT   Other Continuous PRN Halford Decamp, MD        levalbuterol [facility]) nebulizer solution 0.31 mg  1 ampule Nebulization Q4H PRN Marda Stalker, MD        levoFLOXacin [facility]) tablet 500 mg  500 mg Oral Q24H [facility] Hiram Gash, MD   500 mg at 03/05/22 0931    NIFEdipine-lidocaine 0.3%-1.5% in petrolatum ointment   Topical Q6H PRN Anders Simmonds, MD        oxyCODONE (ROXICODONE) immediate release tablet 5 mg  5 mg Oral Q4H PRN Marda Stalker, MD   5 mg at 03/05/22 9604    Or    oxyCODONE (ROXICODONE) immediate release tablet 10 mg  10 mg Oral Q4H PRN Marda Stalker, MD   10 mg at 02/26/22 2101    polyethylene glycol (MIRALAX) packet 17 g  17 g Oral BID PRN Hiram Gash, MD        prochlorperazine (COMPAZINE) tablet 10 mg  10 mg Oral Q6H PRN Marda Stalker, MD        sodium chloride (NS) 0.9 % flush 10 mL  10 mL Intravenous BID Marda Stalker, MD   10 mL at 03/05/22 0930    sodium chloride (NS) 0.9 % infusion   Intravenous Continuous Anders Simmonds, MD   50 mL at 03/01/22 1405    valACYclovir (VALTREX) tablet 500 mg  500 mg Oral Daily Halford Decamp, MD   500 mg at 03/05/22 0930       Physical Exam:  General: pleasant, well-appearing, female, NAD, answering questions appropriately  HEENT: normocephalic, atraumatic  Eyes: EOMI  Cardiovascular: extremities warm and well perfused, 3+ radial pulses, no peripheral edema  Pulmonary: normal respiratory effort, symmetric chest excursion  Gastrointestinal: soft, NTND  Musculoskeletal: normal muscle tone  Skin: no lesions on clothed exam      Labs:  Recent Labs     03/03/22  0102 03/04/22  0003 03/05/22  0025   WBC 0.3* 0.5* 1.0*   NEUTROABS 0.0* 0.0* 0.0*   LYMPHSABS 0.2* 0.3* 0.5*   HGB 6.4* 7.2* 7.2*   HCT 17.7* 19.8* 19.7*   PLT 14* 19* 39*   CREATININE 0.57* 0.58* 0.69   BUN 15 14 14    BILITOT 0.7 0.6 0.7   BILIDIR 0.30 0.20 0.20   AST 25 35* 31   ALT 77* 87* 82*   ALKPHOS 63 60 63   K 3.7 3.8 3.7   MG 1.8 1.7 1.7   CALCIUM 9.0 9.0 8.8   NA 138 140 139   CL 107 106 106   CO2 25.0 27.0 26.0   PHOS 3.1 3.8 4.3       Imaging:  CT A/P with 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

## 2022-03-05 NOTE — Unmapped (Signed)
Aox4, VSS, afebrile, & free of falls/injuries during shift. Oxy x3 overnight for hemorrhoid pain w/moderate relief noted. No further concerns at this time.     Problem: Adult Inpatient Plan of Care  Goal: Plan of Care Review  Outcome: Progressing  Goal: Patient-Specific Goal (Individualized)  Outcome: Progressing  Goal: Absence of Hospital-Acquired Illness or Injury  Outcome: Progressing  Intervention: Identify and Manage Fall Risk  Recent Flowsheet Documentation  Taken 03/04/2022 2025 by Harley Alto, RN  Safety Interventions:   bleeding precautions   infection management   isolation precautions   lighting adjusted for tasks/safety   low bed   neutropenic precautions   nonskid shoes/slippers when out of bed  Intervention: Prevent and Manage VTE (Venous Thromboembolism) Risk  Recent Flowsheet Documentation  Taken 03/04/2022 2025 by Harley Alto, RN  Activity Management: activity adjusted per tolerance  Intervention: Prevent Infection  Recent Flowsheet Documentation  Taken 03/04/2022 2025 by Harley Alto, RN  Infection Prevention:   hand hygiene promoted   single patient room provided  Goal: Optimal Comfort and Wellbeing  Outcome: Ongoing - Unchanged  Goal: Readiness for Transition of Care  Outcome: Ongoing - Unchanged  Goal: Rounds/Family Conference  Outcome: Progressing     Problem: Infection  Goal: Absence of Infection Signs and Symptoms  Outcome: Progressing  Intervention: Prevent or Manage Infection  Recent Flowsheet Documentation  Taken 03/04/2022 2025 by Harley Alto, RN  Infection Management: aseptic technique maintained  Isolation Precautions: protective precautions maintained

## 2022-03-06 LAB — BASIC METABOLIC PANEL
ANION GAP: 5 mmol/L (ref 5–14)
BLOOD UREA NITROGEN: 10 mg/dL (ref 9–23)
BUN / CREAT RATIO: 16
CALCIUM: 8.4 mg/dL — ABNORMAL LOW (ref 8.7–10.4)
CHLORIDE: 106 mmol/L (ref 98–107)
CO2: 25 mmol/L (ref 20.0–31.0)
CREATININE: 0.63 mg/dL
EGFR CKD-EPI (2021) FEMALE: >90 mL/min/{1.73_m2} (ref >=60)
GLUCOSE RANDOM: 99 mg/dL (ref 70–179)
POTASSIUM: 3.9 mmol/L (ref 3.4–4.8)
SODIUM: 136 mmol/L (ref 135–145)

## 2022-03-06 LAB — SLIDE REVIEW

## 2022-03-06 LAB — CBC W/ AUTO DIFF
BASOPHILS ABSOLUTE COUNT: 0 10*9/L (ref 0.0–0.1)
BASOPHILS RELATIVE PERCENT: 0 %
EOSINOPHILS ABSOLUTE COUNT: 0.1 10*9/L (ref 0.0–0.5)
EOSINOPHILS RELATIVE PERCENT: 4 %
HEMATOCRIT: 19.5 % — ABNORMAL LOW (ref 34.0–44.0)
HEMOGLOBIN: 7.1 g/dL — ABNORMAL LOW (ref 11.3–14.9)
LYMPHOCYTES ABSOLUTE COUNT: 0.7 10*9/L — ABNORMAL LOW (ref 1.1–3.6)
LYMPHOCYTES RELATIVE PERCENT: 44.3 %
MEAN CORPUSCULAR HEMOGLOBIN CONC: 36.4 g/dL — ABNORMAL HIGH (ref 32.0–36.0)
MEAN CORPUSCULAR HEMOGLOBIN: 31.6 pg (ref 25.9–32.4)
MEAN CORPUSCULAR VOLUME: 86.8 fL (ref 77.6–95.7)
MEAN PLATELET VOLUME: 7.8 fL (ref 6.8–10.7)
MONOCYTES ABSOLUTE COUNT: 0.8 10*9/L (ref 0.3–0.8)
MONOCYTES RELATIVE PERCENT: 50.8 %
NEUTROPHILS ABSOLUTE COUNT: 0 10*9/L — CL (ref 1.8–7.8)
NEUTROPHILS RELATIVE PERCENT: 0.9 %
PLATELET COUNT: 64 10*9/L — ABNORMAL LOW (ref 150–450)
RED BLOOD CELL COUNT: 2.25 10*12/L — ABNORMAL LOW (ref 3.95–5.13)
RED CELL DISTRIBUTION WIDTH: 18.1 % — ABNORMAL HIGH (ref 12.2–15.2)
WBC ADJUSTED: 1.6 10*9/L — ABNORMAL LOW (ref 3.6–11.2)

## 2022-03-06 LAB — HEPATIC FUNCTION PANEL
ALBUMIN: 3.3 g/dL — ABNORMAL LOW (ref 3.4–5.0)
ALKALINE PHOSPHATASE: 63 U/L (ref 46–116)
ALT (SGPT): 73 U/L — ABNORMAL HIGH (ref 10–49)
AST (SGOT): 27 U/L (ref <=34)
BILIRUBIN DIRECT: 0.2 mg/dL (ref 0.00–0.30)
BILIRUBIN TOTAL: 0.6 mg/dL (ref 0.3–1.2)
PROTEIN TOTAL: 5.8 g/dL (ref 5.7–8.2)

## 2022-03-06 LAB — MAGNESIUM: MAGNESIUM: 1.7 mg/dL (ref 1.6–2.6)

## 2022-03-06 LAB — PHOSPHORUS: PHOSPHORUS: 2.8 mg/dL (ref 2.4–5.1)

## 2022-03-06 MED ADMIN — cefepime (MAXIPIME) 2 g in sodium chloride 0.9 % (NS) 100 mL IVPB-MBP: 2 g | INTRAVENOUS | @ 19:00:00 | Stop: 2022-03-08

## 2022-03-06 MED ADMIN — sodium chloride (NS) 0.9 % flush 10 mL: 10 mL | INTRAVENOUS | @ 01:00:00

## 2022-03-06 MED ADMIN — cetirizine (ZyrTEC) tablet 10 mg: 10 mg | ORAL | @ 13:00:00

## 2022-03-06 MED ADMIN — sodium chloride (NS) 0.9 % flush 10 mL: 10 mL | INTRAVENOUS | @ 13:00:00

## 2022-03-06 MED ADMIN — oxyCODONE (ROXICODONE) immediate release tablet 5 mg: 5 mg | ORAL | @ 01:00:00 | Stop: 2022-03-12

## 2022-03-06 MED ADMIN — fluconazole (DIFLUCAN) tablet 200 mg: 200 mg | ORAL | @ 13:00:00

## 2022-03-06 MED ADMIN — valACYclovir (VALTREX) tablet 500 mg: 500 mg | ORAL | @ 13:00:00

## 2022-03-06 MED ADMIN — acetaminophen (TYLENOL) tablet 650 mg: 650 mg | ORAL | @ 09:00:00

## 2022-03-06 MED ADMIN — oxyCODONE (ROXICODONE) immediate release tablet 5 mg: 5 mg | ORAL | @ 10:00:00 | Stop: 2022-03-12

## 2022-03-06 MED ADMIN — cefepime (MAXIPIME) 2 g in sodium chloride 0.9 % (NS) 100 mL IVPB-MBP: 2 g | INTRAVENOUS | @ 10:00:00 | Stop: 2022-03-08

## 2022-03-06 MED ADMIN — cefepime (MAXIPIME) 2 g in sodium chloride 0.9 % (NS) 100 mL IVPB-MBP: 2 g | INTRAVENOUS | @ 02:00:00 | Stop: 2022-03-08

## 2022-03-06 MED ADMIN — dasatinib (SPRYCEL) tablet 70 mg: 70 mg | ORAL | @ 13:00:00

## 2022-03-06 MED ADMIN — oxyCODONE (ROXICODONE) immediate release tablet 5 mg: 5 mg | ORAL | @ 19:00:00 | Stop: 2022-03-12

## 2022-03-06 MED ADMIN — dapsone tablet 100 mg: 100 mg | ORAL | @ 01:00:00

## 2022-03-06 NOTE — Unmapped (Signed)
Daily Progress Note      Active Problems:    B-cell acute lymphoblastic leukemia (ALL) (CMS-HCC)    Acute cough    Pancytopenia (CMS-HCC)    Febrile neutropenia (CMS-HCC)       LOS: 9 days     Assessment/Plan:  Neutropenic Fever of Unclear Etiology  New painful lymph node   Work notable for [employer] x2 (8/2) NG 48h, UCx NG, CTAP only with new complex cystic appearance of cervix but no other intraabdominal pathology. CT chest (7/29) with mild centrilobular nodular GGO in LLL. Now with sore throat and right, painful lymphadenopathy. Discussed with patient decision to monitor clinically for interval resolution in lieu of further imaging. If c/w bacterial lymphadenitis, should be covered by cefepime unless gross absence. Currently patient has pain over the lymph node - could consider [place] or CT neck if unimproved in AM 8/6   Negative VRP 8/6  Last febrile 8/2 10am  Antibiotics: cefepime/vancomycin (7/28), cefepime (7/29-8/4) for total 7 days. Plan to monitor off antibiotics on 8/5 and discharge if afebrile on 8/6.    External Hemorrhoids  Severely painful hemorrhoids with possible associated fissure. Does not want to trial miralax as it causes bloating or senna because it causes profound diarrhea. Counseled to maintain fibrous diet and hydration to soften stools.   Bowel regimen: apple juice, miralax PRN  Pain: lidocaine/CCB and phenylepiphrine cream TID. 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  Transfuse for Hb > 7 and Plt > 10  Dasatinib 70mg  daily  PPx: dapsone 100mg , fluconazole 200mg , valtrex 500mg , levaquin 500mg  daily    Subjective:    Patient has reports sore throat overnight. When checked, small tender lymph node palpable on the right neck. Worse with swallowing and tender to the touch. Has been having soup today due to upset stomach, but no changes in diet. Denies any pain with chewing, dental pain.     Objective:     Vital signs in last 24 hours:  Temp:  [36.7 ??C (98.1 ??F)-38 ??C (100.4 ??F)] 38 ??C (100.4 ??F)  Heart Rate:  [93-120] 112  Resp:  [14-18] 18  BP: (90-104)/(60-75) 100/62  MAP (mmHg):  [70-85] 73  SpO2:  [93 %-98 %] 93 %    Intake/Output last 3 shifts:  I/O last 3 completed shifts:  In: 409 [I.V.:409]  Out: -     Meds:  Current Facility-Administered Medications   Medication Dose Route Frequency Provider Last Rate Last Admin    acetaminophen (TYLENOL) tablet 650 mg  650 mg Oral Q8H PRN Marda Stalker, MD   650 mg at 03/06/22 0525    albuterol (PROVENTIL HFA;VENTOLIN HFA) 90 mcg/actuation inhaler 2 puff  2 puff Inhalation Q6H PRN Marda Stalker, MD   2 puff at 02/26/22 1639    aminocaproic acid (AMICAR) oral solution  1 g Oral Q1H PRN Barnett Abu, MD        bismuth subsalicylate (PEPTO-BISMOL) oral suspension  30 mL Oral Q6H PRN Marda Stalker, MD        carboxymethylcellulose sodium (THERATEARS) 0.25 % ophthalmic solution 2 drop  2 drop Both Eyes QID PRN Anders Simmonds, MD   2 drop at 03/03/22 1134    cefepime (MAXIPIME) 2 g in sodium chloride 0.9 % (NS) 100 mL IVPB-MBP  2 g Intravenous Q8H Anders Simmonds,  MD   Stopped at 03/06/22 1653    cetirizine (ZyrTEC) tablet 10 mg  10 mg Oral Daily Marda Stalker, MD   10 mg at 03/06/22 0347    dapsone tablet 100 mg  100 mg Oral Daily Marda Stalker, MD   100 mg at 03/05/22 2049    dasatinib (SPRYCEL) tablet 70 mg  70 mg Oral Daily Halford Decamp, MD   70 mg at 03/06/22 0908    dibucaine (NUPERCAINAL) 1 % ointment   Topical Q2H PRN Jeannene Patella, MD        emollient combination no.92 (LUBRIDERM) lotion 1 application.  1 application. Topical Q1H PRN Marda Stalker, MD        fluconazole (DIFLUCAN) tablet 200 mg  200 mg Oral Daily Marda Stalker, MD   200 mg at 03/06/22 4259    IP OKAY TO TREAT   Other Continuous PRN Halford Decamp, MD        levalbuterol [facility]) nebulizer solution 0.31 mg  1 ampule Nebulization Q4H PRN Marda Stalker, MD        NIFEdipine-lidocaine 0.3%-1.5% in petrolatum ointment   Topical Q6H PRN Anders Simmonds, MD        oxyCODONE (ROXICODONE) immediate release tablet 5 mg  5 mg Oral Q4H PRN Marda Stalker, MD   5 mg at 03/06/22 1458    Or    oxyCODONE (ROXICODONE) immediate release tablet 10 mg  10 mg Oral Q4H PRN Marda Stalker, MD   10 mg at 02/26/22 2101    polyethylene glycol (MIRALAX) packet 17 g  17 g Oral BID PRN Hiram Gash, MD        prochlorperazine (COMPAZINE) tablet 10 mg  10 mg Oral Q6H PRN Marda Stalker, MD        sodium chloride (NS) 0.9 % flush 10 mL  10 mL Intravenous BID Marda Stalker, MD   10 mL at 03/06/22 5638    sodium chloride (NS) 0.9 % infusion   Intravenous Continuous Anders Simmonds, MD   50 mL at 03/01/22 1405    valACYclovir (VALTREX) tablet 500 mg  500 mg Oral Daily Halford Decamp, MD   500 mg at 03/06/22 7564       Physical Exam:  General: pleasant, well-appearing, female, NAD, answering questions appropriately  HEENT: normocephalic, atraumatic. 1cm, tender, mobile lymph node on the anterior right neck. No overlying erythema/fluctuance.   Eyes: EOMI  Cardiovascular: extremities warm and well perfused, 3+ radial pulses, no peripheral edema  Pulmonary: normal respiratory effort, symmetric chest excursion  Gastrointestinal: soft, NTND  Musculoskeletal: normal muscle tone  Skin: no lesions on clothed exam      Labs:  Recent Labs     03/04/22  0003 03/05/22  0025 03/06/22  0013   WBC 0.5* 1.0* 1.6*   NEUTROABS 0.0* 0.0* 0.0*   LYMPHSABS 0.3* 0.5* 0.7*   HGB 7.2* 7.2* 7.1*   HCT 19.8* 19.7* 19.5*   PLT 19* 39* 64*   CREATININE 0.58* 0.69 0.63   BUN 14 14 10    BILITOT 0.6 0.7 0.6   BILIDIR 0.20 0.20 0.20   AST 35* 31 27   ALT 87* 82* 73*   ALKPHOS 60 63 63   K 3.8 3.7 3.9   MG 1.7 1.7 1.7   CALCIUM 9.0 8.8 8.4*   NA 140 139 136   CL 106 106 106   CO2 27.0 26.0 25.0  PHOS 3.8 4.3 2.8       Imaging:  None new     Anders Simmonds, MD  03/06/2022 6:36 PM   Hematology/Oncology Department   [facility]   Group pager: [phone]

## 2022-03-06 NOTE — Unmapped (Signed)
Aox4, afebrile, & free of falls/injuries during shift. Soft BPs 90s/60s & HR 110-120s- MD notified & no new orders. Other VSS. Oxy x2 for hemorrhoid pain w/moderate relief noted. Pt reported progressively worsening sore throat throughout night- MD notified & OK to give tylenol x1 for pain. Pt slept on & off throughout night. No further concerns at this time.     Problem: Adult Inpatient Plan of Care  Goal: Plan of Care Review  Outcome: Progressing  Goal: Patient-Specific Goal (Individualized)  Outcome: Progressing  Goal: Absence of Hospital-Acquired Illness or Injury  Outcome: Progressing  Intervention: Identify and Manage Fall Risk  Recent Flowsheet Documentation  Taken 03/05/2022 2053 by Harley Alto, RN  Safety Interventions:   bleeding precautions   infection management   isolation precautions   lighting adjusted for tasks/safety   low bed   neutropenic precautions   nonskid shoes/slippers when out of bed  Intervention: Prevent and Manage VTE (Venous Thromboembolism) Risk  Recent Flowsheet Documentation  Taken 03/05/2022 2053 by Harley Alto, RN  Activity Management: activity adjusted per tolerance  Intervention: Prevent Infection  Recent Flowsheet Documentation  Taken 03/05/2022 2053 by Harley Alto, RN  Infection Prevention:   hand hygiene promoted   single patient room provided  Goal: Optimal Comfort and Wellbeing  Outcome: Ongoing - Unchanged  Goal: Readiness for Transition of Care  Outcome: Ongoing - Unchanged  Goal: Rounds/Family Conference  Outcome: Progressing     Problem: Infection  Goal: Absence of Infection Signs and Symptoms  Outcome: Progressing  Intervention: Prevent or Manage Infection  Recent Flowsheet Documentation  Taken 03/05/2022 2053 by Harley Alto, RN  Infection Management: aseptic technique maintained  Isolation Precautions: protective precautions maintained

## 2022-03-07 LAB — CBC W/ AUTO DIFF
BASOPHILS ABSOLUTE COUNT: 0 10*9/L (ref 0.0–0.1)
BASOPHILS RELATIVE PERCENT: 0.1 %
EOSINOPHILS ABSOLUTE COUNT: 0.1 10*9/L (ref 0.0–0.5)
EOSINOPHILS RELATIVE PERCENT: 2.8 %
HEMATOCRIT: 18.9 % — ABNORMAL LOW (ref 34.0–44.0)
HEMOGLOBIN: 6.9 g/dL — ABNORMAL LOW (ref 11.3–14.9)
LYMPHOCYTES ABSOLUTE COUNT: 0.9 10*9/L — ABNORMAL LOW (ref 1.1–3.6)
LYMPHOCYTES RELATIVE PERCENT: 37.1 %
MEAN CORPUSCULAR HEMOGLOBIN CONC: 36.3 g/dL — ABNORMAL HIGH (ref 32.0–36.0)
MEAN CORPUSCULAR HEMOGLOBIN: 32 pg (ref 25.9–32.4)
MEAN CORPUSCULAR VOLUME: 88.2 fL (ref 77.6–95.7)
MEAN PLATELET VOLUME: 8.1 fL (ref 6.8–10.7)
MONOCYTES ABSOLUTE COUNT: 1.3 10*9/L — ABNORMAL HIGH (ref 0.3–0.8)
MONOCYTES RELATIVE PERCENT: 56.1 %
NEUTROPHILS ABSOLUTE COUNT: 0.1 10*9/L — CL (ref 1.8–7.8)
NEUTROPHILS RELATIVE PERCENT: 3.9 %
PLATELET COUNT: 106 10*9/L — ABNORMAL LOW (ref 150–450)
RED BLOOD CELL COUNT: 2.15 10*12/L — ABNORMAL LOW (ref 3.95–5.13)
RED CELL DISTRIBUTION WIDTH: 18.2 % — ABNORMAL HIGH (ref 12.2–15.2)
WBC ADJUSTED: 2.4 10*9/L — ABNORMAL LOW (ref 3.6–11.2)

## 2022-03-07 LAB — BASIC METABOLIC PANEL
ANION GAP: 7 mmol/L (ref 5–14)
BLOOD UREA NITROGEN: 10 mg/dL (ref 9–23)
BUN / CREAT RATIO: 15
CALCIUM: 8.6 mg/dL — ABNORMAL LOW (ref 8.7–10.4)
CHLORIDE: 107 mmol/L (ref 98–107)
CO2: 26 mmol/L (ref 20.0–31.0)
CREATININE: 0.67 mg/dL
EGFR CKD-EPI (2021) FEMALE: >90 mL/min/{1.73_m2} (ref >=60)
GLUCOSE RANDOM: 113 mg/dL (ref 70–179)
POTASSIUM: 3.8 mmol/L (ref 3.4–4.8)
SODIUM: 140 mmol/L (ref 135–145)

## 2022-03-07 LAB — HEPATIC FUNCTION PANEL
ALBUMIN: 3.4 g/dL (ref 3.4–5.0)
ALKALINE PHOSPHATASE: 62 U/L (ref 46–116)
ALT (SGPT): 69 U/L — ABNORMAL HIGH (ref 10–49)
AST (SGOT): 28 U/L (ref <=34)
BILIRUBIN DIRECT: 0.2 mg/dL (ref 0.00–0.30)
BILIRUBIN TOTAL: 0.7 mg/dL (ref 0.3–1.2)
PROTEIN TOTAL: 5.8 g/dL (ref 5.7–8.2)

## 2022-03-07 LAB — PHOSPHORUS: PHOSPHORUS: 2.8 mg/dL (ref 2.4–5.1)

## 2022-03-07 LAB — MAGNESIUM: MAGNESIUM: 1.8 mg/dL (ref 1.6–2.6)

## 2022-03-07 MED ADMIN — valACYclovir (VALTREX) tablet 500 mg: 500 mg | ORAL | @ 13:00:00

## 2022-03-07 MED ADMIN — sodium chloride (NS) 0.9 % flush 10 mL: 10 mL | INTRAVENOUS | @ 13:00:00

## 2022-03-07 MED ADMIN — oxyCODONE (ROXICODONE) immediate release tablet 5 mg: 5 mg | ORAL | @ 13:00:00 | Stop: 2022-03-12

## 2022-03-07 MED ADMIN — acetaminophen (TYLENOL) tablet 650 mg: 650 mg | ORAL

## 2022-03-07 MED ADMIN — acetaminophen (TYLENOL) tablet 650 mg: 650 mg | ORAL | @ 01:00:00

## 2022-03-07 MED ADMIN — cefepime (MAXIPIME) 2 g in sodium chloride 0.9 % (NS) 100 mL IVPB-MBP: 2 g | INTRAVENOUS | @ 18:00:00 | Stop: 2022-03-08

## 2022-03-07 MED ADMIN — cetirizine (ZyrTEC) tablet 10 mg: 10 mg | ORAL | @ 13:00:00

## 2022-03-07 MED ADMIN — acetaminophen (TYLENOL) tablet 650 mg: 650 mg | ORAL | @ 09:00:00

## 2022-03-07 MED ADMIN — oxyCODONE (ROXICODONE) immediate release tablet 5 mg: 5 mg | ORAL | @ 01:00:00 | Stop: 2022-03-12

## 2022-03-07 MED ADMIN — cefepime (MAXIPIME) 2 g in sodium chloride 0.9 % (NS) 100 mL IVPB-MBP: 2 g | INTRAVENOUS | @ 10:00:00 | Stop: 2022-03-08

## 2022-03-07 MED ADMIN — oxyCODONE (ROXICODONE) immediate release tablet 5 mg: 5 mg | ORAL | @ 07:00:00 | Stop: 2022-03-12

## 2022-03-07 MED ADMIN — fluconazole (DIFLUCAN) tablet 200 mg: 200 mg | ORAL | @ 13:00:00

## 2022-03-07 MED ADMIN — cefepime (MAXIPIME) 2 g in sodium chloride 0.9 % (NS) 100 mL IVPB-MBP: 2 g | INTRAVENOUS | @ 02:00:00 | Stop: 2022-03-08

## 2022-03-07 MED ADMIN — sodium chloride (NS) 0.9 % flush 10 mL: 10 mL | INTRAVENOUS

## 2022-03-07 MED ADMIN — dapsone tablet 100 mg: 100 mg | ORAL

## 2022-03-07 MED ADMIN — acetaminophen (TYLENOL) tablet 650 mg: 650 mg | ORAL | @ 18:00:00

## 2022-03-07 MED ADMIN — oxyCODONE (ROXICODONE) immediate release tablet 5 mg: 5 mg | ORAL | @ 19:00:00 | Stop: 2022-03-12

## 2022-03-07 MED ADMIN — dasatinib (SPRYCEL) tablet 70 mg: 70 mg | ORAL | @ 13:00:00

## 2022-03-07 NOTE — Unmapped (Signed)
Aox4 & free of falls/injuries during shift. Febrile w/tmax 38.2- tylenol given x2 w/good results. HR 110-120s & BPs 90s/60s at times- MD aware & no new orders at this time. Oxy x2 for pain w/moderate relief noted. 1 unit pRBCs given for Hgb 6.9. Pt slept on & off throughout night. No further concerns at this time.     Problem: Adult Inpatient Plan of Care  Goal: Plan of Care Review  Outcome: Progressing  Goal: Patient-Specific Goal (Individualized)  Outcome: Progressing  Goal: Absence of Hospital-Acquired Illness or Injury  Outcome: Progressing  Intervention: Identify and Manage Fall Risk  Recent Flowsheet Documentation  Taken 03/06/2022 2044 by Harley Alto, RN  Safety Interventions:   bleeding precautions   infection management   isolation precautions   lighting adjusted for tasks/safety   low bed   neutropenic precautions   nonskid shoes/slippers when out of bed  Intervention: Prevent and Manage VTE (Venous Thromboembolism) Risk  Recent Flowsheet Documentation  Taken 03/06/2022 2044 by Harley Alto, RN  Activity Management: activity adjusted per tolerance  Intervention: Prevent Infection  Recent Flowsheet Documentation  Taken 03/06/2022 2044 by Harley Alto, RN  Infection Prevention:   hand hygiene promoted   single patient room provided  Goal: Optimal Comfort and Wellbeing  Outcome: Ongoing - Unchanged  Goal: Readiness for Transition of Care  Outcome: Ongoing - Unchanged  Goal: Rounds/Family Conference  Outcome: Progressing     Problem: Infection  Goal: Absence of Infection Signs and Symptoms  Outcome: Ongoing - Unchanged  Intervention: Prevent or Manage Infection  Recent Flowsheet Documentation  Taken 03/06/2022 2044 by Harley Alto, RN  Infection Management: aseptic technique maintained  Isolation Precautions: protective precautions maintained

## 2022-03-07 NOTE — Unmapped (Signed)
Daily Progress Note      Active Problems:    B-cell acute lymphoblastic leukemia (ALL) (CMS-HCC)    Acute cough    Pancytopenia (CMS-HCC)    Febrile neutropenia (CMS-HCC)       LOS: 10 days     Assessment/Plan:  Neutropenic Fever of Unclear Etiology  New painful lymphadenopathy, nasal drainage   Work notable for [employer] x2 (8/2) NG 48h, UCx NG, CTAP only with new complex cystic appearance of cervix but no other intraabdominal pathology. CT chest (7/29) with mild centrilobular nodular GGO in LLL. Clinically with sore throat and right, painful lymphadenopathy. Negative VRP. Differential includes bacterial lymphadenitis, deep neck space infections, sinusitis, or URI. Given physical exam and overall stability, not as concerned about severe bacterial infections requiring aspiration. Therefore, discussed with patient decision to monitor clinically for interval resolution in lieu of further imaging as they would not provide utility for management as she is already on antibiotics that would cover bacterial infection other than abscess. Could consider CT sinuses and/or neck if persistent tomorrow   Last febrile 8/7 5am to 38.2   Source: ?URI vs. Sinusitis   Will consider CT sinuses, head if no improvement over the next day  Antibiotics: cefepime/vancomycin (7/28), cefepime (7/29-8/4) for total 7 days. Restarted cefepime (8/5 - ) for recurrent fever    External Hemorrhoids  Severely painful hemorrhoids with possible associated fissure. Does not want to trial miralax as it causes bloating or senna because it causes profound diarrhea. Counseled to maintain fibrous diet and hydration to soften stools.   Bowel regimen: apple juice, miralax PRN  Pain: lidocaine/CCB and phenylepiphrine cream TID. 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  Transfuse for Hb > 7 and Plt > 10  Dasatinib 70mg  daily  PPx: dapsone 100mg , fluconazole 200mg , valtrex 500mg , levaquin 500mg  daily    Subjective:    Patricia [name] is doing well overall this morning, but does have persistent throat pain. She reports that it's most prominent when she swallows, limited to the right anterior neck. She denies being visited by any sick contacts. She is having some rhinorrhea, but cannot remember what it looked like. She denies any trismus, neck stiffness, difficulty breathing, internal odynophagia/dysphagia, drooling, or voice changes.     Fevered to 38.2 at 5am, responsive to tylenol.       Objective:     Vital signs in last 24 hours:  Temp:  [36.8 ??C (98.2 ??F)-38.2 ??C (100.8 ??F)] 37.5 ??C (99.5 ??F)  Heart Rate:  [97-123] 97  Resp:  [16-18] 17  BP: (91-111)/(60-81) 96/68  MAP (mmHg):  [70-90] 78  SpO2:  [93 %-100 %] 100 %    Intake/Output last 3 shifts:  I/O last 3 completed shifts:  In: 984 [I.V.:671; Blood:313]  Out: -     Meds:  Current Facility-Administered Medications   Medication Dose Route Frequency Provider Last Rate Last Admin    acetaminophen (TYLENOL) tablet 650 mg  650 mg Oral Q8H PRN Marda Stalker, MD   650 mg at 03/07/22 1357    albuterol (PROVENTIL HFA;VENTOLIN HFA) 90 mcg/actuation inhaler 2 puff  2 puff Inhalation Q6H PRN Marda Stalker, MD   2 puff at 02/26/22 1639    aminocaproic acid (AMICAR) oral solution  1 g Oral Q1H PRN Barnett Abu, MD  bismuth subsalicylate (PEPTO-BISMOL) oral suspension  30 mL Oral Q6H PRN Marda Stalker, MD        carboxymethylcellulose sodium (THERATEARS) 0.25 % ophthalmic solution 2 drop  2 drop Both Eyes QID PRN Anders Simmonds, MD   2 drop at 03/03/22 1134    cefepime (MAXIPIME) 2 g in sodium chloride 0.9 % (NS) 100 mL IVPB-MBP  2 g Intravenous Q8H Anders Simmonds, MD   Stopped at 03/07/22 1508    cetirizine (ZyrTEC) tablet 10 mg  10 mg Oral Daily Marda Stalker, MD   10 mg at 03/07/22 0859    dapsone tablet 100 mg  100 mg Oral Daily Marda Stalker, MD   100 mg at 03/06/22 2020    dasatinib (SPRYCEL) tablet 70 mg  70 mg Oral Daily Halford Decamp, MD   70 mg at 03/07/22 0900    dibucaine (NUPERCAINAL) 1 % ointment   Topical Q2H PRN Jeannene Patella, MD        emollient combination no.92 (LUBRIDERM) lotion 1 application.  1 application. Topical Q1H PRN Marda Stalker, MD        fluconazole (DIFLUCAN) tablet 200 mg  200 mg Oral Daily Marda Stalker, MD   200 mg at 03/07/22 0859    IP OKAY TO TREAT   Other Continuous PRN Halford Decamp, MD        levalbuterol [facility]) nebulizer solution 0.31 mg  1 ampule Nebulization Q4H PRN Marda Stalker, MD        NIFEdipine-lidocaine 0.3%-1.5% in petrolatum ointment   Topical Q6H PRN Anders Simmonds, MD        oxyCODONE (ROXICODONE) immediate release tablet 5 mg  5 mg Oral Q4H PRN Marda Stalker, MD   5 mg at 03/07/22 1509    Or    oxyCODONE (ROXICODONE) immediate release tablet 10 mg  10 mg Oral Q4H PRN Marda Stalker, MD   10 mg at 02/26/22 2101    polyethylene glycol (MIRALAX) packet 17 g  17 g Oral BID PRN Hiram Gash, MD        prochlorperazine (COMPAZINE) tablet 10 mg  10 mg Oral Q6H PRN Marda Stalker, MD        sodium chloride (NS) 0.9 % flush 10 mL  10 mL Intravenous BID Marda Stalker, MD   10 mL at 03/07/22 0902    sodium chloride (NS) 0.9 % infusion   Intravenous Continuous Anders Simmonds, MD   50 mL at 03/01/22 1405    valACYclovir (VALTREX) tablet 500 mg  500 mg Oral Daily Halford Decamp, MD   500 mg at 03/07/22 3151       Physical Exam:  General: pleasant, well-appearing, female, NAD, answering questions appropriately  HEENT: normocephalic, atraumatic. 1cm, tender, mobile lymph node on the anterior right neck. No overlying erythema/fluctuance. Stable from prior. No tonsiller erythema/exudates or mucosal lesions  Eyes: EOMI  Cardiovascular: extremities warm and well perfused, 3+ radial pulses, no peripheral edema  Pulmonary: normal respiratory effort, symmetric chest excursion  Gastrointestinal: soft, NTND  Musculoskeletal: normal muscle tone  Skin: no lesions on clothed exam      Labs:  Recent Labs     03/05/22  0025 03/06/22  0013 03/07/22  0053   WBC 1.0* 1.6* 2.4*   NEUTROABS 0.0* 0.0* 0.1*   LYMPHSABS 0.5* 0.7* 0.9*   HGB 7.2* 7.1* 6.9*   HCT 19.7* 19.5* 18.9*   PLT 39* 64*  106*   CREATININE 0.69 0.63 0.67   BUN 14 10 10    BILITOT 0.7 0.6 0.7   BILIDIR 0.20 0.20 0.20   AST 31 27 28    ALT 82* 73* 69*   ALKPHOS 63 63 62   K 3.7 3.9 3.8   MG 1.7 1.7 1.8   CALCIUM 8.8 8.4* 8.6*   NA 139 136 140   CL 106 106 107   CO2 26.0 25.0 26.0   PHOS 4.3 2.8 2.8       Imaging:  None new     Anders Simmonds, MD  03/07/2022  4:51 PM   Hematology/Oncology Department   [facility]   Group pager: [phone]

## 2022-03-08 LAB — BASIC METABOLIC PANEL
ANION GAP: 6 mmol/L (ref 5–14)
BLOOD UREA NITROGEN: 10 mg/dL (ref 9–23)
BUN / CREAT RATIO: 14
CALCIUM: 8.8 mg/dL (ref 8.7–10.4)
CHLORIDE: 107 mmol/L (ref 98–107)
CO2: 25 mmol/L (ref 20.0–31.0)
CREATININE: 0.71 mg/dL
EGFR CKD-EPI (2021) FEMALE: >90 mL/min/{1.73_m2} (ref >=60)
GLUCOSE RANDOM: 108 mg/dL (ref 70–179)
POTASSIUM: 3.8 mmol/L (ref 3.4–4.8)
SODIUM: 138 mmol/L (ref 135–145)

## 2022-03-08 LAB — CBC W/ AUTO DIFF
BASOPHILS ABSOLUTE COUNT: 0 10*9/L (ref 0.0–0.1)
BASOPHILS RELATIVE PERCENT: 0.1 %
EOSINOPHILS ABSOLUTE COUNT: 0.1 10*9/L (ref 0.0–0.5)
EOSINOPHILS RELATIVE PERCENT: 2.1 %
HEMATOCRIT: 22.6 % — ABNORMAL LOW (ref 34.0–44.0)
HEMOGLOBIN: 8 g/dL — ABNORMAL LOW (ref 11.3–14.9)
LYMPHOCYTES ABSOLUTE COUNT: 0.9 10*9/L — ABNORMAL LOW (ref 1.1–3.6)
LYMPHOCYTES RELATIVE PERCENT: 29.4 %
MEAN CORPUSCULAR HEMOGLOBIN CONC: 35.5 g/dL (ref 32.0–36.0)
MEAN CORPUSCULAR HEMOGLOBIN: 31.1 pg (ref 25.9–32.4)
MEAN CORPUSCULAR VOLUME: 87.6 fL (ref 77.6–95.7)
MEAN PLATELET VOLUME: 7.9 fL (ref 6.8–10.7)
MONOCYTES ABSOLUTE COUNT: 1.6 10*9/L — ABNORMAL HIGH (ref 0.3–0.8)
MONOCYTES RELATIVE PERCENT: 52.7 %
NEUTROPHILS ABSOLUTE COUNT: 0.5 10*9/L — ABNORMAL LOW (ref 1.8–7.8)
NEUTROPHILS RELATIVE PERCENT: 15.7 %
PLATELET COUNT: 192 10*9/L (ref 150–450)
RED BLOOD CELL COUNT: 2.58 10*12/L — ABNORMAL LOW (ref 3.95–5.13)
RED CELL DISTRIBUTION WIDTH: 16.7 % — ABNORMAL HIGH (ref 12.2–15.2)
WBC ADJUSTED: 2.9 10*9/L — ABNORMAL LOW (ref 3.6–11.2)

## 2022-03-08 LAB — HEPATIC FUNCTION PANEL
ALBUMIN: 3.4 g/dL (ref 3.4–5.0)
ALKALINE PHOSPHATASE: 67 U/L (ref 46–116)
ALT (SGPT): 62 U/L — ABNORMAL HIGH (ref 10–49)
AST (SGOT): 27 U/L (ref <=34)
BILIRUBIN DIRECT: 0.2 mg/dL (ref 0.00–0.30)
BILIRUBIN TOTAL: 0.7 mg/dL (ref 0.3–1.2)
PROTEIN TOTAL: 6.1 g/dL (ref 5.7–8.2)

## 2022-03-08 LAB — SLIDE REVIEW

## 2022-03-08 LAB — MAGNESIUM: MAGNESIUM: 1.8 mg/dL (ref 1.6–2.6)

## 2022-03-08 LAB — PHOSPHORUS: PHOSPHORUS: 2.8 mg/dL (ref 2.4–5.1)

## 2022-03-08 MED ADMIN — acetaminophen (TYLENOL) tablet 650 mg: 650 mg | ORAL | @ 23:00:00

## 2022-03-08 MED ADMIN — oxyCODONE (ROXICODONE) immediate release tablet 5 mg: 5 mg | ORAL | @ 16:00:00 | Stop: 2022-03-12

## 2022-03-08 MED ADMIN — valACYclovir (VALTREX) tablet 500 mg: 500 mg | ORAL | @ 14:00:00

## 2022-03-08 MED ADMIN — hydrOXYzine (ATARAX) tablet 10 mg: 10 mg | ORAL | @ 03:00:00 | Stop: 2022-03-07

## 2022-03-08 MED ADMIN — oxyCODONE (ROXICODONE) immediate release tablet 5 mg: 5 mg | ORAL | @ 01:00:00 | Stop: 2022-03-12

## 2022-03-08 MED ADMIN — sodium chloride (NS) 0.9 % flush 10 mL: 10 mL | INTRAVENOUS | @ 01:00:00

## 2022-03-08 MED ADMIN — cetirizine (ZyrTEC) tablet 10 mg: 10 mg | ORAL | @ 14:00:00

## 2022-03-08 MED ADMIN — cefepime (MAXIPIME) 2 g in sodium chloride 0.9 % (NS) 100 mL IVPB-MBP: 2 g | INTRAVENOUS | @ 02:00:00 | Stop: 2022-03-08

## 2022-03-08 MED ADMIN — sodium chloride (NS) 0.9 % flush 10 mL: 10 mL | INTRAVENOUS | @ 14:00:00

## 2022-03-08 MED ADMIN — cefepime (MAXIPIME) 2 g in sodium chloride 0.9 % (NS) 100 mL IVPB-MBP: 2 g | INTRAVENOUS | @ 10:00:00 | Stop: 2022-03-08

## 2022-03-08 MED ADMIN — dasatinib (SPRYCEL) tablet 70 mg: 70 mg | ORAL | @ 14:00:00

## 2022-03-08 MED ADMIN — dapsone tablet 100 mg: 100 mg | ORAL | @ 01:00:00

## 2022-03-08 NOTE — Unmapped (Signed)
Daily Progress Note      Active Problems:    B-cell acute lymphoblastic leukemia (ALL) (CMS-HCC)    Acute cough    Pancytopenia (CMS-HCC)    Febrile neutropenia (CMS-HCC)       LOS: 11 days     Assessment/Plan:  Neutropenic Fever of Unclear Etiology  New painful lymphadenopathy, nasal drainage   Work notable for [employer] x2 (8/2) NG 48h, UCx NG, CTAP only with new complex cystic appearance of cervix but no other intraabdominal pathology. CT chest (7/29) with mild centrilobular nodular GGO in LLL. Clinically with sore throat and right, painful lymphadenopathy. Negative VRP. Differential includes bacterial lymphadenitis, deep neck space infections, sinusitis, or URI. Given physical exam and overall stability, not as concerned about severe bacterial infections requiring aspiration. Therefore, discussed with patient decision to monitor clinically for interval resolution in lieu of further imaging as they would not provide utility for management as she is already on antibiotics that would cover bacterial infection other than abscess.  Clinically improving today, and with ANC rising, will again defer imaging as low concern for abscess   Last febrile 8/7 5am to 38.2   Discontinued cefepime today (held at 1pm 8/8 dose); if afebrile, will plan for discharge tomorrow   Source: ?URI vs. Sinusitis   Antibiotics: cefepime/vancomycin (7/28), cefepime (7/29-8/4) for total 7 days. Restarted cefepime (8/5-8/8) for recurrent fever    External Hemorrhoids  Severely painful hemorrhoids with possible associated fissure. Does not want to trial miralax as it causes bloating or senna because it causes profound diarrhea. Counseled to maintain fibrous diet and hydration to soften stools.   Bowel regimen: apple juice, miralax PRN  Pain: lidocaine/CCB and phenylepiphrine cream TID. 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  Transfuse for Hb > 7 and Plt > 10  Dasatinib 70mg  daily  PPx: dapsone 100mg , fluconazole 200mg , valtrex 500mg , levaquin 500mg  daily    Subjective:    Patricia [name] is doing well overall this morning, but does have persistent throat pain. She does not want any symptomatic relief as nothing works. Overnight she requested strep test (which came back negative) to alleviate her concerns. She has been afebrile the last 24h (highest temp of 37.9) . She is excited that her ANC is coming up. She still has clear rhinorrhea. She denies any trismus, neck stiffness, difficulty breathing, internal odynophagia/dysphagia, drooling, or voice changes. Does not desire any symptomatic management.     Objective:     Vital signs in last 24 hours:  Temp:  [36.8 ??C (98.2 ??F)-37.9 ??C (100.2 ??F)] 37.3 ??C (99.1 ??F)  Heart Rate:  [97-115] 97  Resp:  [16-20] 16  BP: (95-114)/(68-81) 106/74  MAP (mmHg):  [78-92] 85  SpO2:  [93 %-100 %] 93 %    Intake/Output last 3 shifts:  I/O last 3 completed shifts:  In: 723 [I.V.:410; Blood:313]  Out: -     Meds:  Current Facility-Administered Medications   Medication Dose Route Frequency Provider Last Rate Last Admin    acetaminophen (TYLENOL) tablet 650 mg  650 mg Oral Q8H PRN Marda Stalker, MD   650 mg at 03/07/22 1939    albuterol (PROVENTIL HFA;VENTOLIN HFA) 90 mcg/actuation inhaler 2 puff  2 puff Inhalation Q6H PRN Marda Stalker, MD   2 puff at 02/26/22 1639    aminocaproic acid (AMICAR) oral  solution  1 g Oral Q1H PRN Barnett Abu, MD        bismuth subsalicylate (PEPTO-BISMOL) oral suspension  30 mL Oral Q6H PRN Marda Stalker, MD        carboxymethylcellulose sodium (THERATEARS) 0.25 % ophthalmic solution 2 drop  2 drop Both Eyes QID PRN Anders Simmonds, MD   2 drop at 03/03/22 1134    cetirizine (ZyrTEC) tablet 10 mg  10 mg Oral Daily Marda Stalker, MD   10 mg at 03/08/22 0931    dapsone tablet 100 mg  100 mg Oral Daily Marda Stalker, MD   100 mg at 03/07/22 2108    dasatinib (SPRYCEL) tablet 70 mg  70 mg Oral Daily Halford Decamp, MD   70 mg at 03/08/22 0935    dibucaine (NUPERCAINAL) 1 % ointment   Topical Q2H PRN Jeannene Patella, MD        emollient combination no.92 (LUBRIDERM) lotion 1 application.  1 application. Topical Q1H PRN Marda Stalker, MD        IP OKAY TO TREAT   Other Continuous PRN Halford Decamp, MD        levalbuterol [facility]) nebulizer solution 0.31 mg  1 ampule Nebulization Q4H PRN Marda Stalker, MD        NIFEdipine-lidocaine 0.3%-1.5% in petrolatum ointment   Topical Q6H PRN Anders Simmonds, MD        oxyCODONE (ROXICODONE) immediate release tablet 5 mg  5 mg Oral Q4H PRN Marda Stalker, MD   5 mg at 03/08/22 1145    Or    oxyCODONE (ROXICODONE) immediate release tablet 10 mg  10 mg Oral Q4H PRN Marda Stalker, MD   10 mg at 02/26/22 2101    polyethylene glycol (MIRALAX) packet 17 g  17 g Oral BID PRN Hiram Gash, MD        prochlorperazine (COMPAZINE) tablet 10 mg  10 mg Oral Q6H PRN Marda Stalker, MD        sodium chloride (NS) 0.9 % flush 10 mL  10 mL Intravenous BID Marda Stalker, MD   10 mL at 03/08/22 0932    sodium chloride (NS) 0.9 % infusion   Intravenous Continuous Anders Simmonds, MD   50 mL at 03/01/22 1405    valACYclovir (VALTREX) tablet 500 mg  500 mg Oral Daily Halford Decamp, MD   500 mg at 03/08/22 1610       Physical Exam:  General: pleasant, well-appearing, female, NAD, answering questions appropriately  HEENT: normocephalic, atraumatic. 1cm, tender, mobile lymph node on the anterior right neck. No overlying erythema/fluctuance. Stable from prior. No tonsiller erythema/exudates or mucosal lesions  Eyes: EOMI  Cardiovascular: extremities warm and well perfused, 3+ radial pulses, no peripheral edema  Pulmonary: normal respiratory effort, symmetric chest excursion  Gastrointestinal: soft, NTND  Musculoskeletal: normal muscle tone  Skin: no lesions on clothed exam      Labs:  Recent Labs 03/06/22  0013 03/07/22  0053 03/08/22  0028   WBC 1.6* 2.4* 2.9*   NEUTROABS 0.0* 0.1* 0.5*   LYMPHSABS 0.7* 0.9* 0.9*   HGB 7.1* 6.9* 8.0*   HCT 19.5* 18.9* 22.6*   PLT 64* 106* 192   CREATININE 0.63 0.67 0.71   BUN 10 10 10    BILITOT 0.6 0.7 0.7   BILIDIR 0.20 0.20 0.20   AST 27 28 27    ALT 73* 69* 62*   ALKPHOS  63 62 67   K 3.9 3.8 3.8   MG 1.7 1.8 1.8   CALCIUM 8.4* 8.6* 8.8   NA 136 140 138   CL 106 107 107   CO2 25.0 26.0 25.0   PHOS 2.8 2.8 2.8       Imaging:  None new     Anders Simmonds, MD  03/08/2022  12:11 PM   Hematology/Oncology Department   [facility]   Group pager: [phone]

## 2022-03-08 NOTE — Unmapped (Signed)
Currently admitted into the hospital

## 2022-03-08 NOTE — Unmapped (Signed)
Aox4, VSS, afebrile, & free of falls/injuries during shift. Tmax 37.9 overnight- tylenol given prior d/t pt feeling feverish. Oxy x1 for pain & atarax x1 for itching w/moderate relief noted. Strep test collected & resulted negative. Pt slept on & off throughout night. No further concerns at this time.     Problem: Adult Inpatient Plan of Care  Goal: Plan of Care Review  Outcome: Progressing  Goal: Patient-Specific Goal (Individualized)  Outcome: Progressing  Goal: Absence of Hospital-Acquired Illness or Injury  Outcome: Progressing  Intervention: Identify and Manage Fall Risk  Recent Flowsheet Documentation  Taken 03/07/2022 2108 by Harley Alto, RN  Safety Interventions:   bleeding precautions   infection management   isolation precautions   lighting adjusted for tasks/safety   low bed   neutropenic precautions   nonskid shoes/slippers when out of bed  Intervention: Prevent and Manage VTE (Venous Thromboembolism) Risk  Recent Flowsheet Documentation  Taken 03/07/2022 2108 by Harley Alto, RN  Activity Management: activity adjusted per tolerance  Intervention: Prevent Infection  Recent Flowsheet Documentation  Taken 03/07/2022 2108 by Harley Alto, RN  Infection Prevention:   hand hygiene promoted   single patient room provided  Goal: Optimal Comfort and Wellbeing  Outcome: Ongoing - Unchanged  Goal: Readiness for Transition of Care  Outcome: Ongoing - Unchanged  Goal: Rounds/Family Conference  Outcome: Progressing     Problem: Infection  Goal: Absence of Infection Signs and Symptoms  Outcome: Ongoing - Unchanged  Intervention: Prevent or Manage Infection  Recent Flowsheet Documentation  Taken 03/07/2022 2108 by Harley Alto, RN  Infection Management: aseptic technique maintained  Isolation Precautions: protective precautions maintained

## 2022-03-09 LAB — CBC W/ AUTO DIFF
BASOPHILS ABSOLUTE COUNT: 0 10*9/L (ref 0.0–0.1)
BASOPHILS RELATIVE PERCENT: 0.4 %
EOSINOPHILS ABSOLUTE COUNT: 0.1 10*9/L (ref 0.0–0.5)
EOSINOPHILS RELATIVE PERCENT: 1.6 %
HEMATOCRIT: 22.7 % — ABNORMAL LOW (ref 34.0–44.0)
HEMOGLOBIN: 8.1 g/dL — ABNORMAL LOW (ref 11.3–14.9)
LYMPHOCYTES ABSOLUTE COUNT: 0.9 10*9/L — ABNORMAL LOW (ref 1.1–3.6)
LYMPHOCYTES RELATIVE PERCENT: 29.5 %
MEAN CORPUSCULAR HEMOGLOBIN CONC: 35.4 g/dL (ref 32.0–36.0)
MEAN CORPUSCULAR HEMOGLOBIN: 31.3 pg (ref 25.9–32.4)
MEAN CORPUSCULAR VOLUME: 88.3 fL (ref 77.6–95.7)
MEAN PLATELET VOLUME: 7.6 fL (ref 6.8–10.7)
MONOCYTES ABSOLUTE COUNT: 1.3 10*9/L — ABNORMAL HIGH (ref 0.3–0.8)
MONOCYTES RELATIVE PERCENT: 40.6 %
NEUTROPHILS ABSOLUTE COUNT: 0.9 10*9/L — ABNORMAL LOW (ref 1.8–7.8)
NEUTROPHILS RELATIVE PERCENT: 27.9 %
NUCLEATED RED BLOOD CELLS: 7 /100{WBCs} — ABNORMAL HIGH (ref <=4)
PLATELET COUNT: 244 10*9/L (ref 150–450)
RED BLOOD CELL COUNT: 2.57 10*12/L — ABNORMAL LOW (ref 3.95–5.13)
RED CELL DISTRIBUTION WIDTH: 17.1 % — ABNORMAL HIGH (ref 12.2–15.2)
WBC ADJUSTED: 3.1 10*9/L — ABNORMAL LOW (ref 3.6–11.2)

## 2022-03-09 LAB — BASIC METABOLIC PANEL
ANION GAP: 6 mmol/L (ref 5–14)
BLOOD UREA NITROGEN: 11 mg/dL (ref 9–23)
BUN / CREAT RATIO: 18
CALCIUM: 8.9 mg/dL (ref 8.7–10.4)
CHLORIDE: 109 mmol/L — ABNORMAL HIGH (ref 98–107)
CO2: 27 mmol/L (ref 20.0–31.0)
CREATININE: 0.6 mg/dL
EGFR CKD-EPI (2021) FEMALE: >90 mL/min/{1.73_m2} (ref >=60)
GLUCOSE RANDOM: 123 mg/dL (ref 70–179)
POTASSIUM: 3.6 mmol/L (ref 3.4–4.8)
SODIUM: 142 mmol/L (ref 135–145)

## 2022-03-09 LAB — HEPATIC FUNCTION PANEL
ALBUMIN: 3.3 g/dL — ABNORMAL LOW (ref 3.4–5.0)
ALKALINE PHOSPHATASE: 66 U/L (ref 46–116)
ALT (SGPT): 64 U/L — ABNORMAL HIGH (ref 10–49)
AST (SGOT): 32 U/L (ref <=34)
BILIRUBIN DIRECT: 0.2 mg/dL (ref 0.00–0.30)
BILIRUBIN TOTAL: 0.5 mg/dL (ref 0.3–1.2)
PROTEIN TOTAL: 5.9 g/dL (ref 5.7–8.2)

## 2022-03-09 LAB — PHOSPHORUS: PHOSPHORUS: 2.9 mg/dL (ref 2.4–5.1)

## 2022-03-09 LAB — MAGNESIUM: MAGNESIUM: 2 mg/dL (ref 1.6–2.6)

## 2022-03-09 LAB — SLIDE REVIEW

## 2022-03-09 MED ORDER — CARBOXYMETHYLCELLULOSE SODIUM 0.25 % EYE DROPS
Freq: Four times a day (QID) | OPHTHALMIC | 0 refills | 38 days | Status: CN | PRN
Start: 2022-03-09 — End: ?

## 2022-03-09 MED ORDER — OXYCODONE 5 MG TABLET
ORAL_TABLET | ORAL | 0 refills | 3 days | Status: CN | PRN
Start: 2022-03-09 — End: 2022-03-14

## 2022-03-09 MED ADMIN — sodium chloride (NS) 0.9 % flush 10 mL: 10 mL | INTRAVENOUS | @ 01:00:00

## 2022-03-09 MED ADMIN — cetirizine (ZyrTEC) tablet 10 mg: 10 mg | ORAL | @ 13:00:00 | Stop: 2022-03-09

## 2022-03-09 MED ADMIN — sodium chloride (NS) 0.9 % flush 10 mL: 10 mL | INTRAVENOUS | @ 13:00:00 | Stop: 2022-03-09

## 2022-03-09 MED ADMIN — dapsone tablet 100 mg: 100 mg | ORAL | @ 01:00:00

## 2022-03-09 MED ADMIN — dasatinib (SPRYCEL) tablet 70 mg: 70 mg | ORAL | @ 13:00:00 | Stop: 2022-03-09

## 2022-03-09 MED ADMIN — oxyCODONE (ROXICODONE) immediate release tablet 5 mg: 5 mg | ORAL | @ 15:00:00 | Stop: 2022-03-09

## 2022-03-09 MED ADMIN — valACYclovir (VALTREX) tablet 500 mg: 500 mg | ORAL | @ 13:00:00 | Stop: 2022-03-09

## 2022-03-09 NOTE — Unmapped (Signed)
[facility]ukemia [facility] Follow Up Visit Note     [name]  Patient Age: 46 y.o.  Encounter Date: 03/10/2022    Primary Care Provider:  [name], MD    Referring Physician:  [name], MD  [address]  [place],  [place] [address]    Assessment/Plan:  Patricia [name] is a 46 y.o. female with past medical history of chronic right shoulder and back pain, hypertension, anxiety, and recently diagnosed Ph+ ALL. S/p induction on GRAAPH-2005 bone marrow biopsy demonstrated morphologic CR, BCR/ABL p190 2/100,000.    Disease specific prognosis: It remains unclear whether or not Ph+ALL patients can be cured with chemotherapy alone plus TKI. R-Hyper-CVAD+dasatinib offers median DFS 31 mo and median OS 47 mo (Ravandi, Cancer 2015).     Prognosis is highly dependant upon achieving CMR. For patients who achieve CMR (neg by pcr) at 3 months, risk of relapse at 4 years is about 25% (Short, Blood 2016).     She is now post cycle 4 of GRAAPH.  Last cycle was complicated by neutropenic fever.  We will plan to move onto cycle 5.  Plan for Neupogen in place of Neulasta given intolerance of Neulasta.    Treatment Timeline:  10/29/2021: Bone marrow biopsy: Ph+ ALL, 51% expression of CD20  10/30/21: Cycle 1 day 1 GRAAPH-2005 induction  11/03/21: ITT #1   11/12/2021: IT #2  11/18/2021: IT #3  11/29/2021: Post cycle 1 bone marrow biopsy: Morphologic CR. MRD by flow insufficient, p190 2/100,000  12/10/2021: Cycle 2 B cycle + rituximab  12/14/21: IT#4  12/17/21: IT#5  01/10/22: Cycle 3 A cycle + rituximab  01/24/22: Bmbx - MRD-neg by PCR (p190)   02/16/22: C4 B cycle + ritux - cb neutropenic fever (no GCSF due to prior pain with this)   ~03/14/22: C5 A cycle - Planned     Ph+ALL, in remission: On GRAAPH 2005 (per Cahlandon Y, et al. Randomized study of reduced intensity chemotherapy combined with imatinib in adults with Ph-positive ALL. Blood 2015 Jun 11;125(24):3711-9) with dasatinib for imatinib. This is C1 high dose TKI+Dex+Vincristine to dose intensification with (R)-HyperCVAD/MA (starting with MA, I.e. Part B or Even cycles).  TKI is continued indefinitely with hopes for at least 2 years.  She has partial (51%) CD20 expression.  Therefore we will proceed with rituximab in addition to hyperCVAD.  - See treatment timeline in Oncology history    -Plan Cycle 4 B cycle R-HyperCVAD/MA+dasatinib   - 1 IT per cycle - did not tolerate 2 per cycle  - Dasatinib 70 mg     - Next bone marrow biopsy following cycle 8  -Plan for Neupogen in place of Neulasta    Hemorrhoids, Anal fissure: Very painful despite supportive care meds  - Recommend having GI surgery see her when admitted to provide further supportive care recommendations. I don't think that surgery would be warranted given her ongoing chemotherapy, though would like to have a concrete plan going forward.     Pancytopenia due to chemotherapy  - counts currently recovered  - supportive care as detailed below when in nadir     Psychosocial distress: She reports a moderate level of anxiety regarding the management of the above.   - Counseling given   - Consider ref to comprehensive cancer support program     Patient-centered care/Shared decision-making:   We discussed the plan above at length. The patient and her husband actively contributed to the conversation.  Specifically, her most important outcomes are:   Prolonging overall survival and Maintaining her overall functional activity     Supportive Care Needs: We recommend based on the patient???s underlying diagnosis and treatment history the following supportive care:    1. Antimicrobial prophylaxis:  ALL (in remission, receiving post-remission therapy): Bacterial: Levofloxacin 500mg  PO daily (when absolute neutrophils </= 0.5);   Fungal: Fluconazole 200mg  PO daily (when absolute neutrophils </= 0.5);   Viral: Valacyclovir 500mg  PO daily (Continuous);   PJP:  SMX/TMP DS PO BID twice weekly (Sat/Sun)    2. Blood product support:  Leukoreduced blood products are required.  Irradiated blood products are preferred, but in case of urgent transfusion needs non-irradiated blood products may be used: 2 units for Hg <= 7.0.     Coordination of care:   - Admission for C4 in a week or so    - Recommend GI surgery consult when she is admitted for evaluation for anal fissure     [name], MD  Leukemia Program  Division of Hematology  [facility]      Nurse Navigator (non-clinical trial patients): Elicia Lamp, RN        Tel. [phone]       Fax. [phone]  Toll-free appointments: [phone]  Scheduling assistance: [phone]  After hours/weekends: [phone] (ask for adult hematology/oncology on-call)      History of Present Illness:   Patricia [name] is a 46 y.o. female with past medical history noted as above who presents for follow up Ph+ ALL.      Her social history is notable for being a Runner, broadcasting/film/video.  She was recently married. She lives with her husband.    Interim history  Feeling okay.  Still worn out from her last admission.  Still having a lot of pain due to hemorrhoids.  No recent fevers.    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 The majority of the abnormal cells (64/88) contained an additional BCR/ABL1 fusion signal, while 9/88 abnormal cells contained an additional ABL1 and ASS1 signal.       Genetics:   Karyotype/FISH:   RESULTS   Date Value Ref Range Status   10/28/2021   Final    NOTE: This report reflects a combined study from a peripheral blood and a bone marrow core biopsy. Eleven cells from the peripheral blood and nine cells from the bone marrow core biopsy were analyzed. The BCR/ABL1 FISH analysis was performed on the peripheral blood.     Abnormal Karyotype:  47,XX,t(9;22)(q34;q11.2),+der(22)t(9;22)[18]/46,XX[2]    Abnormal FISH:  A BCR/ABL1 interphase FISH assay showed a signal pattern consistent with a BCR::ABL1 rearrangement and the 9;22 translocation in 88% of the 100 cells scored. The majority of the abnormal cells (64/88) contained an additional BCR/ABL1 fusion signal, while 9/88 abnormal cells contained an additional ABL1 and ASS1 signal.           Pertinent Phenotypic data:  CD19 98%  CD20 on diagnosis 51%  CD22 98%  Treatment Timeline:  10/29/2021: Bone marrow biopsy: Ph+ ALL, 51% expression of CD20  10/30/21: Cycle 1 day 1 GRAAPH-2005 induction  11/03/21: ITT #1   11/12/2021: IT #2  11/18/2021: IT #3  11/29/2021: Post cycle 1 bone marrow biopsy: Morphologic CR.  MRD by flow insufficient, p190 2/100,000  12/10/2021: Cycle 2 B cycle + rituximab  12/14/21: IT#4  12/17/21: IT#5  01/10/22: Cycle 3 A cycle + rituximab  01/24/22: Bmbx - MRD-neg by PCR (p190)   ~02/09/22: PLAN C4          B-cell acute lymphoblastic leukemia (ALL) (CMS-HCC)   10/29/2021 Initial Diagnosis    B-cell acute lymphoblastic leukemia (ALL) (CMS-HCC)     10/30/2021 -  Chemotherapy    IP/OP LEUKEMIA GRAAPH-2005 < 60 YO (OP PEGFILGRASTIM ON DAY 7)  [No description for this plan]     01/06/2022 Endocrine/Hormone Therapy    OP LEUPROLIDE (LUPRON) 11.25 MG EVERY 3 MONTHS  Plan Provider: [name], MD         Review of Systems:   ROS reviewed and negative except as noted in H and P     Allergies:  Allergies   Allergen Reactions    Erythromycin Hives    Other      Pt cant take ibuprofen due to condition.    Sulfa (Sulfonamide Antibiotics) Anaphylaxis    Azithromycin        Medications: No current facility-administered medications for this visit.  No current outpatient medications on file.    Facility-Administered Medications Ordered in Other Visits:     cetirizine (ZyrTEC) tablet 10 mg, 10 mg, Oral, Nightly, Herbert Moors, AGNP, 10 mg at 03/22/22 1610    CHEMO CLARIFICATION ORDER, , Other, Continuous PRN, Maryanna Shape, PA    cycloPHOSphamide (CYTOXAN) 585 mg in sodium chloride (NS) 0.9 % 250 mL IVPB, 300 mg/m2 (Treatment Plan Recorded), Intravenous, Q12H, [name], MD, Stopped at 03/22/22 1459    dapsone tablet 100 mg, 100 mg, Oral, Daily, Herbert Moors, AGNP, 100 mg at 03/22/22 9604    dasatinib (SPRYCEL) tablet 70 mg, 70 mg, Oral, Daily, [name], MD, 70 mg at 03/21/22 2207    dexAMETHasone (DECADRON) tablet 40 mg, 40 mg, Oral, Q24H, [name], MD, 40 mg at 03/21/22 2207    diphenhydrAMINE (BENADRYL) injection 25 mg, 25 mg, Intravenous, Q4H PRN, [name], MD    Melene Muller ON 03/24/2022] DOXOrubicin (ADRIAMYCIN) 97.5 mg in sodium chloride (NS) 0.9 % 1,000 mL IVPB, 50 mg/m2 (Treatment Plan Recorded), Intravenous, Once, [name], MD    Dr Silvana Newness Hemorrhoid & Fissure Ointment *Patient Home supplied*, 1 application., Topical, TID PRN, Herbert Moors, AGNP    emollient combination no.92 (LUBRIDERM) lotion 1 application., 1 application., Topical, Q1H PRN, Herbert Moors, AGNP    EPINEPHrine [facility]) injection 0.3 mg, 0.3 mg, Intramuscular, Daily PRN, [name], MD    famotidine (PF) (PEPCID) injection 20 mg, 20 mg, Intravenous, Q4H PRN, [name], MD    IP OKAY TO TREAT, , Other, Continuous PRN, Herbert Moors, AGNP    loperamide (IMODIUM) capsule 2 mg, 2 mg, Oral, Q2H PRN, Herbert Moors, AGNP    loperamide (IMODIUM) capsule 4 mg, 4 mg, Oral, Once PRN, Herbert Moors, AGNP    meperidine (DEMEROL) injection 25 mg, 25 mg, Intravenous, Q30 Min PRN, [name], MD    mesna (MESNEX) 1,170 mg in sodium chloride (NS) 0.9 % 1,000 mL IVPB,  600 mg/m2 (Treatment Plan Recorded), Intravenous, Q24H, [name], MD, Last Rate: 44.2 mL/hr at 03/21/22 2321, 1,170 mg at 03/21/22 2321    methylPREDNISolone sodium succinate (PF) (SOLU-medrol) injection 125 mg, 125 mg, Intravenous, Q4H PRN, [name], MD    OLANZapine (ZyPREXA) tablet 10 mg, 10 mg, Oral, Q24H, [name], MD, 10 mg at 03/21/22 2249    ondansetron (ZOFRAN) tablet 24 mg, 24 mg, Oral, Q24H, [name], MD, 24 mg at 03/21/22 2249    oxyCODONE (ROXICODONE) immediate release tablet 5 mg, 5 mg, Oral, Q4H PRN, Herbert Moors, AGNP, 5 mg at 03/22/22 0050    prochlorperazine (COMPAZINE) injection 10 mg, 10 mg, Intravenous, Q6H PRN, [name], MD    prochlorperazine (COMPAZINE) tablet 10 mg, 10 mg, Oral, Q6H PRN, [name], MD    sodium chloride (NS) 0.9 % flush 10 mL, 10 mL, Intravenous, BID, Herbert Moors, AGNP, 10 mL at 03/21/22 2104    sodium chloride (NS) 0.9 % flush 10 mL, 10 mL, Intravenous, BID, Herbert Moors, AGNP, 10 mL at 03/22/22 0940    sodium chloride (NS) 0.9 % flush 10 mL, 10 mL, Intravenous, BID, Herbert Moors, AGNP, 10 mL at 03/22/22 0940    sodium chloride (NS) 0.9 % infusion, 20 mL/hr, Intravenous, Continuous, Herbert Moors, AGNP    sodium chloride (NS) 0.9 % infusion, 100 mL/hr, Intravenous, Continuous, [name], MD, Last Rate: 100 mL/hr at 03/21/22 2321, 100 mL/hr at 03/21/22 2321    sodium chloride (NS) 0.9 % infusion, 20 mL/hr, Intravenous, Continuous PRN, [name], MD    sodium chloride 0.9% (NS) bolus 1,000 mL, 1,000 mL, Intravenous, Daily PRN, [name], MD    valACYclovir (VALTREX) tablet 500 mg, 500 mg, Oral, Daily, Herbert Moors, AGNP, 500 mg at 03/21/22 2059    [START ON 03/24/2022] vinCRIStine (ONCOVIN) 2 mg in sodium chloride (NS) 0.9 % 25 mL IVPB, 2 mg, Intravenous, Once, [name], MD    Medical History:  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       Social History:  Social History     Social History Narrative    Not on file       Family History:  No family history on file.    Objective:   BP 121/94  - Pulse 109  - Temp 36.9 ??C (98.4 ??F) (Temporal)  - Resp 16  - Ht 169 cm (5' 6.54)  - Wt 81.7 kg (180 lb 1.9 oz)  - SpO2 95%  - BMI 28.61 kg/m??     Physical Exam:  GENERAL: Well-appearing woman accompanied by her husband.  HEART: Normal color, not excessive pallor.  CHEST/LUNG: Normal work of breathing. No dyspnea with conversation.  ABDOMEN: No obvious distention.    EXTREMITIES: No edema, cyanosis or clubbing.   SKIN: No noticable rash or petechiae.   NEURO EXAM: Grossly intact.         Test Results:  03/10/2022 CBC/d and CMP reviewed with patient

## 2022-03-09 NOTE — Unmapped (Incomplete)
Problem: Adult Inpatient Plan of Care  Goal: Plan of Care Review  Outcome: Discharged to Home  Goal: Patient-Specific Goal (Individualized)  Outcome: Discharged to Home  Goal: Absence of Hospital-Acquired Illness or Injury  Outcome: Discharged to Home  Goal: Optimal Comfort and Wellbeing  Outcome: Discharged to Home  Goal: Readiness for Transition of Care  Outcome: Discharged to Home  Goal: Rounds/Family Conference  Outcome: Discharged to Home     Problem: Infection  Goal: Absence of Infection Signs and Symptoms  Outcome: Discharged to Home

## 2022-03-09 NOTE — Unmapped (Signed)
Physician Discharge Summary [facility]  4 ONC [facility]  [address]  [place] [address]  Dept: [phone]  Loc: [phone]     Identifying Information:   Patricia [name]  05-24-1976  [id number]    Primary Care Physician: [name], MD     Referring Physician: Timoteo Ace Dev*     Code Status: Full Code    Admit Date: 02/25/2022    Discharge Date: 03/09/2022     Discharge To: Home    Discharge Service: [facility] - Hematology Res Floor Team (MED E - Tower)     Discharge Attending Physician: Rolanda Lundborg, MD    Discharge Diagnoses:  Active Problems:    B-cell acute lymphoblastic leukemia (ALL) (CMS-HCC)    Acute cough    Pancytopenia (CMS-HCC)    Febrile neutropenia (CMS-HCC)  Resolved Problems:    * No resolved hospital problems. *      Outpatient Provider Follow Up Issues:   Supportive Care Recommendations:  We recommend based on the patient???s underlying diagnosis and treatment history the following supportive care:    1. Antimicrobial prophylaxis:  ALL: Viral: Valacyclovir 500mg  PO daily (D1, continuous); Dapsone     2. Blood product support:  Leukoreduced blood products are required.  Irradiated blood products are preferred, but in case of urgent transfusion needs non-irradiated blood products may be used:     -  RBC transfusion threshold: transfuse 1 units for Hgb < 7 g/dL.    3. Ambulatory GI surgery: external hemorrhoid management refractory to topicals     4. Ambulatory Gyn: 8/3 CT findings of new complex cystic appearance of the cervix, which may be secondary to nabothian cysts versus other cervical pathology. Recommend correlation with endovaginal ultrasound versus colposcopy.    Hospital Course:   Patricia [name] is an 46 y.o. 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 Able to be discharged after afebrile >24h off cefepime and ANC risen to 0.9.     Active Problems    Febrile Neutropenia  Patient was febrile to 38.7 on arrival to infusion appointment 7/28. 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 She reported worsening cough, increased fatigue, and loose stools starting day prior to admission. No dyspnea or hypoxia on admission, but respiratory etiology suspected in light of centrilobular ground glass opacities in the LLL and symptoms (vs. MTX toxicity). Initial blood cultures and urine cultures negative. MRSA nares negative. Of note, patient had a similar presentation after completing C2 and was diagnosed with LLL PNA at that time. Patient started cefepime on 7/28 and given stability, abx were discontinued three times with recurrent fevers within 24h of discontinuation. Cefepime discontinued the day before discharge. By discharge, she had been afebrile >24h off cefepime and ANC count was 0.9.     Ph+ALL in remission  Initially diagnosed in 09/2021. Please see H&P for full oncologic history. Started C4B of R-HyperCVAD/MA+dasatinib on 7/19 with IT cytarabine. Receiving IT once per cycle (methotrexate on odd cycles, cytarabine on even cycles). Post C3 Bmbx demonstrated no molecular evidence of disease. Next Bmbx scheduled for after cycle 8. Dasatinib continued during admission. Continued prophylaxis with: valtrex, dapsone.     External hemorrhoids  Patient having significant pain from hemorrhoids, noted this had gotten worse after most recent methotrexate course. Unfortunately, topical creams provided little effect, including sitz baths, lidocaine-based creams, calcium channel  blockers, hydrocortisone cream. Counseled patient on risks of surgical hemorrhoidectomy given low ANC. Patient was reluctant to use laxatives due to prior issues with diarrhea to soften stools. Therefore, pain was treated with a small dose of PRN oxycodone 5mg  with counseling to use sparingly to avoid inducing constipation and exacerbating strain. Discharged on hydration, fiber, and preventative measures to prevent straining and fissure irritation along with a short course of oxycodone. Ambulatory referral provided to GI surgery on discharge.       Procedures:  None  No admission procedures for hospital encounter.  ______________________________________________________________________  Discharge Medications:     Your Medication List        STOP taking these medications      fluconazole 200 MG tablet  Commonly known as: DIFLUCAN     levoFLOXacin 500 MG tablet  Commonly known as: LEVAQUIN     prednisoLONE acetate 1 % ophthalmic suspension  Commonly known as: PRED FORTE            CONTINUE taking these medications      acetaminophen 650 MG CR tablet  Commonly known as: TYLENOL 8 HOUR  Take 1 tablet (650 mg total) by mouth every eight (8) hours as needed for pain.     albuterol 90 mcg/actuation inhaler  Commonly known as: PROVENTIL HFA;VENTOLIN HFA  Inhale 2 puffs every six (6) hours as needed for wheezing.     cetirizine 10 MG tablet  Commonly known as: ZyrTEC  Take 1 tablet (10 mg total) by mouth nightly.     dapsone 100 MG tablet  Take 1 tablet (100 mg total) by mouth daily.     levalbuterol 0.31 mg/3 mL nebulizer solution  Commonly known as: XOPENEX  Inhale 3 mL (0.31 mg total) by nebulization every four (4) hours as needed for wheezing (cough).     NON FORMULARY PATIENT SUPPLIED MED  Apply 1 application. topically Three (3) times a day as needed. Doctor Butler's Cream [Amazon]     oxyCODONE 5 MG immediate release tablet  Commonly known as: ROXICODONE  Take 1-2 tablets (5-10 mg total) by mouth every four (4) hours as needed for pain     prochlorperazine 10 MG tablet  Commonly known as: COMPAZINE  Take 1 tablet (10 mg total) by mouth every six (6) hours as needed for up to 14 days.     SpryceL 70 MG tablet  Generic drug: dasatinib  Take 1 tablet (70 mg total) by mouth daily.     STOMACH RELIEF 262 mg/15 mL suspension  Generic drug: bismuth subsalicylate  Take 30 mL by mouth every six (6) hours as needed for indigestion.     valACYclovir 500 MG tablet  Commonly known as: VALTREX  Take 1 tablet (500 mg total) by mouth daily.              Allergies:  Erythromycin, Other, Sulfa (sulfonamide antibiotics), and Azithromycin  ______________________________________________________________________  Pending Test Results (if blank, then none):  Pending Labs       Order Current Status    Lower Respiratory Culture Collected (02/27/22 1257)    Blood Culture, Adult Preliminary result    Blood Culture, Adult Preliminary result            Most Recent Labs:  All lab results last 24 hours -   Recent Results (from the past 24 hour(s))   Basic Metabolic Panel    Collection Time: 03/09/22  1:51 AM   Result Value Ref Range    Sodium  142 135 - 145 mmol/L    Potassium 3.6 3.4 - 4.8 mmol/L    Chloride 109 (H) 98 - 107 mmol/L    CO2 27.0 20.0 - 31.0 mmol/L    Anion Gap 6 5 - 14 mmol/L    BUN 11 9 - 23 mg/dL    Creatinine 1.61 0.96 - 0.80 mg/dL    BUN/Creatinine Ratio 18     eGFR CKD-EPI (2021) Female >90 >=60 mL/min/1.38m2    Glucose 123 70 - 179 mg/dL    Calcium 8.9 8.7 - 04.5 mg/dL   Hepatic Function Panel    Collection Time: 03/09/22  1:51 AM   Result Value Ref Range    Albumin 3.3 (L) 3.4 - 5.0 g/dL    Total Protein 5.9 5.7 - 8.2 g/dL    Total Bilirubin 0.5 0.3 - 1.2 mg/dL    Bilirubin, Direct 4.09 0.00 - 0.30 mg/dL    AST 32 <=81 U/L    ALT 64 (H) 10 - 49 U/L    Alkaline Phosphatase 66 46 - 116 U/L   Magnesium Level    Collection Time: 03/09/22  1:51 AM   Result Value Ref Range    Magnesium 2.0 1.6 - 2.6 mg/dL   Phosphorus Level    Collection Time: 03/09/22  1:51 AM   Result Value Ref Range    Phosphorus 2.9 2.4 - 5.1 mg/dL   Type and Screen subsequent    Collection Time: 03/09/22  1:51 AM   Result Value Ref Range    ABO Grouping O POS     Antibody Screen NEG    CBC w/ Differential    Collection Time: 03/09/22  1:51 AM   Result Value Ref Range    WBC 3.1 (L) 3.6 - 11.2 10*9/L    RBC 2.57 (L) 3.95 - 5.13 10*12/L    HGB 8.1 (L) 11.3 - 14.9 g/dL    HCT 19.1 (L) 47.8 - 44.0 %    MCV 88.3 77.6 - 95.7 fL    MCH 31.3 25.9 - 32.4 pg    MCHC 35.4 32.0 - 36.0 g/dL    RDW 29.5 (H) 62.1 - 15.2 %    MPV 7.6 6.8 - 10.7 fL    Platelet 244 150 - 450 10*9/L    nRBC 7 (H) <=4 /100 WBCs    Neutrophils % 27.9 %    Lymphocytes % 29.5 %    Monocytes % 40.6 %    Eosinophils % 1.6 %    Basophils % 0.4 %    Absolute Neutrophils 0.9 (L) 1.8 - 7.8 10*9/L    Absolute Lymphocytes 0.9 (L) 1.1 - 3.6 10*9/L    Absolute Monocytes 1.3 (H) 0.3 - 0.8 10*9/L    Absolute Eosinophils 0.1 0.0 - 0.5 10*9/L    Absolute Basophils 0.0 0.0 - 0.1 10*9/L    Anisocytosis Slight (A) Not Present   Morphology Review    Collection Time: 03/09/22  1:51 AM   Result Value Ref Range    Smear Review Comments See Comment (A) Undefined       Relevant Studies/Radiology (if blank, then none):  CT Abdomen Pelvis W Contrast    Result Date: 03/04/2022  EXAM: CT ABDOMEN PELVIS W CONTRAST DATE: 03/04/2022 5:37 AM ACCESSION: [id number] UN DICTATED: 03/04/2022 8:13 AM INTERPRETATION LOCATION: [facility] CLINICAL INDICATION: 46 years old with fever, rectal pain COMPARISON: CT abdomen and pelvis 11/08/2021, CT chest 02/26/2022 TECHNIQUE: A helical CT scan of the abdomen and pelvis was obtained  following IV contrast from the lung bases through the pubic symphysis. Images were reconstructed in the axial plane. Coronal and sagittal reformatted images were also provided for further evaluation. FINDINGS: LOWER CHEST: Minimal interval change in left lower lobe basilar groundglass opacity with centrilobular nodularity. Increased adjacent atelectasis. Partially visualized central venous catheter terminating in the right atrium. LIVER: Normal liver contour.  No focal liver lesions. BILIARY: The gallbladder is normal in appearance. No biliary ductal dilatation.  SPLEEN: Normal in size and contour. 3 hypodense splenic lesions incompletely characterized but favored to represent hemangiomas. PANCREAS: Normal pancreatic contour.  No focal lesions.  No ductal dilation. ADRENAL GLANDS: Normal appearance of the adrenal glands. KIDNEYS/URETERS: Symmetric renal enhancement.  No hydronephrosis.  No solid renal mass. Nonobstructive left nephrolithiasis, stable. BLADDER: Decompressed limiting evaluation. REPRODUCTIVE ORGANS: Anteverted uterus. New complex cystic appearance of the level of the cervix may represent a nabothian cysts versus other cervical pathology. No adnexal masses. GI TRACT: No findings of bowel obstruction or acute inflammation. Moderate colonic stool burden. Normal appendix. PERITONEUM, RETROPERITONEUM AND MESENTERY: No free air.  No ascites.  No fluid collection. LYMPH NODES: No adenopathy. VESSELS: Hepatic and portal veins are patent.  Normal caliber aorta.  BONES and SOFT TISSUES: No aggressive osseous lesions.  No focal soft tissue lesions. Unchanged small fat-containing umbilical hernia.     New complex cystic appearance of the cervix, which may be secondary to nabothian cysts versus other cervical pathology. Recommend correlation with endovaginal ultrasound versus colposcopy. Otherwise no acute abnormality in the abdomen or pelvis. Chronic and incidental findings as below.    XR Chest Portable    Result Date: 03/02/2022  EXAM: XR CHEST PORTABLE DATE: 03/02/2022 7:21 AM ACCESSION: [id number] UN DICTATED: 03/02/2022 8:25 AM INTERPRETATION LOCATION: [facility] CLINICAL INDICATION: 46 years old Female with FEVER  TECHNIQUE: Single View AP Chest Radiograph. COMPARISON: Chest radiograph 02/17/2022 FINDINGS: Right chest wall Port-A-Cath terminating in the superior right atrium. Lungs are clear. No pleural effusion or pneumothorax. Stable cardiomediastinal silhouette.     No acute abnormalities.    ECG 12 Lead    Result Date: 02/28/2022  SINUS TACHYCARDIA LOW VOLTAGE QRS POSSIBLE SEPTAL INFARCT  , AGE UNDETERMINED ABNORMAL ECG WHEN COMPARED WITH ECG OF 27-Oct-2021 17:38, POSSIBLE SEPTAL INFARCT  IS NOW PRESENT Confirmed by Vickey Huger [id number]) on 02/28/2022 10:27:51 AM    CT Chest W Contrast    Result Date: 02/26/2022  EXAM: CT CHEST W CONTRAST DATE: 02/26/2022 11:32 AM ACCESSION: [id number] UN DICTATED: 02/26/2022 11:48 AM INTERPRETATION LOCATION: [facility] CLINICAL INDICATION: 46 years old Female with neutropenic fever; c/f pneumonia  TECHNIQUE: Contiguous axial images were reconstructed through the chest following a single breath hold helical acquisition during the administration of intravenous contrast material.  Images were reformatted in the axial and sagittal planes. MIP slabs were also constructed. COMPARISON: Chest radiograph 02/17/2022, and prior FINDINGS: LUNGS, AIRWAYS, AND PLEURA: Mild centrilobular nodular groundglass opacities in the posterior basal segment of the left lower lobe. Otherwise, lungs are clear. Central airways are patent. No pleural effusion or pneumothorax. MEDIASTINUM AND LYMPH NODES: No enlarged intrathoracic lymph nodes. No other mediastinal abnormality. HEART AND VASCULATURE: Cardiac chambers are normal in size. No pericardial effusion. Aorta is normal in caliber. Main pulmonary artery is normal in size. CHEST WALL AND BONES: Unremarkable. UPPER ABDOMEN: Unremarkable. OTHER: No actionable thyroid nodule. DEVICES: Right internal jugular port catheter with tip in the superior right atrium.     Mild centrilobular nodular groundglass opacities in the left lower lobe could  be infectious/inflammatory in etiology, possibly secondary to early bronchopneumonia.     XR Chest Portable    Result Date: 02/25/2022  EXAM: XR CHEST PORTABLE DATE: 02/25/2022 6:20 PM ACCESSION: [id number] UN DICTATED: 02/25/2022 9:29 PM INTERPRETATION LOCATION: [facility] CLINICAL INDICATION: 46 years old Female with COUGH  TECHNIQUE: Single View AP Chest Radiograph. COMPARISON: None FINDINGS: Right chest wall Port-A-Cath with its tip in proximal right atrium Lungs are clear. No pleural effusion or pneumothorax. Unremarkable cardiomediastinal silhouette.     No acute abnormalities.   ______________________________________________________________________            Other Instructions       Notify Provider      Notify covering provider of initiation of Febrile Neutropenia Treatment Protocol.            Follow Up instructions and Outpatient Referrals     Ambulatory referral to Gynecology      Ambulatory referral to GI Surgery      Blood Culture      Release to patient: Immediate     Routine Blood Cultures detect bacteria as well as some fungi such as   Candida and Cryptococcus species.       Preferred Method: 2 Peripheral Cultures.         Refer to the Reference Link: Best Practices for Obtaining Blood Cultures   for additional guidance.     Blood Culture      Release to patient: Immediate     Routine Blood Cultures detect bacteria as well as some fungi such as   Candida and Cryptococcus species.       Preferred Method: 2 Peripheral Cultures.         Refer to the Reference Link: Best Practices for Obtaining Blood Cultures   for additional guidance.     CBC and differential      Release to patient: Immediate    CBC w/ Differential      Release to patient: Immediate    Morphology Review      Release to patient: Immediate    Urinalysis with Microscopy      Release to patient: Immediate    Urine Culture       Best practice for testing for UTI in patients >65 years old:      First, order UA for patients with symptoms of UTI; Order urine culture   ONLY if  UA results indicative of UTI     In neutropenic patients, urine culture alone may be indicated      In patients suspected of UTI in which antibiotics must be started   immediately,  UA with reflex may be indicated         Do not order a UA/or urine culture for:     Fever or leukocytosis without urinary symptoms      Altered mental status alone     Malodorous urine     Change in urine color or sediment in urine               Appointments which have been scheduled for you      Mar 10, 2022  1:00 PM  (Arrive by 12:30 PM)  LAB ONLY [place] with ADULT ONC LAB  [facility] ADULT ONCOLOGY LAB DRAW STATION [place] [facility] REGION) [address]  [place] [address]  [phone]        Mar 10, 2022  2:00 PM  (Arrive by 1:30 PM)  RETURN ACTIVE [place] with Reuel Boom  Lewie Chamber, MD  [place] HEMATOLOGY ONCOLOGY 2ND FLR [facility] HOS[facility] REGION) [address]  [place] [address]  [phone]        Apr 20, 2022  1:45 PM  (Arrive by 1:30 PM)  NEW ENT with Bayard Hugger SINUS FELLOW  [place] [facility]ARYNGOLOGY [address] [place] [facility] REGION) [address]  [address]  [place] [address]  [phone]        Jun 30, 2022  1:55 PM  (Arrive by 1:40 PM)  RETURN NEUROLOGY with Idelia Salm, MD  [facility] NEUROLOGY CLINIC [address] [place] [facility] REGION) [address]  [place] [address]  [phone]             ______________________________________________________________________  Discharge Day Services:  BP 109/79  - Pulse 117  - Temp 36.8 ??C (98.2 ??F) (Oral)  - Resp 16  - Ht 169 cm (5' 6.54)  - Wt 81.2 kg (179 lb 0.2 oz)  - SpO2 98%  - BMI 28.43 kg/m??   Pt seen on the day of discharge and determined appropriate for discharge.    Condition at Discharge: good    Length of Discharge: I spent less than 30 mins in the discharge of this patient.

## 2022-03-10 ENCOUNTER — Encounter: Admit: 2022-03-10 | Discharge: 2022-03-11 | Payer: PRIVATE HEALTH INSURANCE

## 2022-03-10 ENCOUNTER — Ambulatory Visit
Admit: 2022-03-10 | Discharge: 2022-03-11 | Payer: PRIVATE HEALTH INSURANCE | Attending: Hematology | Primary: Hematology

## 2022-03-10 ENCOUNTER — Other Ambulatory Visit: Admit: 2022-03-10 | Discharge: 2022-03-11 | Payer: PRIVATE HEALTH INSURANCE

## 2022-03-10 DIAGNOSIS — C9100 Acute lymphoblastic leukemia not having achieved remission: Principal | ICD-10-CM

## 2022-03-10 LAB — CBC W/ AUTO DIFF
BASOPHILS ABSOLUTE COUNT: 0 10*9/L (ref 0.0–0.1)
BASOPHILS RELATIVE PERCENT: 0.3 %
EOSINOPHILS ABSOLUTE COUNT: 0 10*9/L (ref 0.0–0.5)
EOSINOPHILS RELATIVE PERCENT: 0.4 %
HEMATOCRIT: 24.3 % — ABNORMAL LOW (ref 34.0–44.0)
HEMOGLOBIN: 8.6 g/dL — ABNORMAL LOW (ref 11.3–14.9)
LYMPHOCYTES ABSOLUTE COUNT: 2.9 10*9/L (ref 1.1–3.6)
LYMPHOCYTES RELATIVE PERCENT: 33.5 %
MEAN CORPUSCULAR HEMOGLOBIN CONC: 35.6 g/dL (ref 32.0–36.0)
MEAN CORPUSCULAR HEMOGLOBIN: 32.3 pg (ref 25.9–32.4)
MEAN CORPUSCULAR VOLUME: 90.9 fL (ref 77.6–95.7)
MEAN PLATELET VOLUME: 7.2 fL (ref 6.8–10.7)
MONOCYTES ABSOLUTE COUNT: 3.3 10*9/L — ABNORMAL HIGH (ref 0.3–0.8)
MONOCYTES RELATIVE PERCENT: 38.2 %
NEUTROPHILS ABSOLUTE COUNT: 2.4 10*9/L (ref 1.8–7.8)
NEUTROPHILS RELATIVE PERCENT: 27.6 %
PLATELET COUNT: 265 10*9/L (ref 150–450)
RED BLOOD CELL COUNT: 2.67 10*12/L — ABNORMAL LOW (ref 3.95–5.13)
RED CELL DISTRIBUTION WIDTH: 17.3 % — ABNORMAL HIGH (ref 12.2–15.2)
WBC ADJUSTED: 8.7 10*9/L (ref 3.6–11.2)

## 2022-03-10 LAB — COMPREHENSIVE METABOLIC PANEL
ALBUMIN: 3.5 g/dL (ref 3.4–5.0)
ALBUMIN: 3.6 g/dL (ref 3.4–5.0)
ALKALINE PHOSPHATASE: 73 U/L (ref 46–116)
ALKALINE PHOSPHATASE: 74 U/L (ref 46–116)
ALT (SGPT): 76 U/L — ABNORMAL HIGH (ref 10–49)
ALT (SGPT): 80 U/L — ABNORMAL HIGH (ref 10–49)
ANION GAP: 5 mmol/L (ref 5–14)
ANION GAP: 7 mmol/L (ref 5–14)
AST (SGOT): 45 U/L — ABNORMAL HIGH (ref <=34)
AST (SGOT): 47 U/L — ABNORMAL HIGH (ref <=34)
BILIRUBIN TOTAL: 0.6 mg/dL (ref 0.3–1.2)
BILIRUBIN TOTAL: 0.6 mg/dL (ref 0.3–1.2)
BLOOD UREA NITROGEN: 10 mg/dL (ref 9–23)
BLOOD UREA NITROGEN: 10 mg/dL (ref 9–23)
BUN / CREAT RATIO: 15
BUN / CREAT RATIO: 15
CALCIUM: 9.1 mg/dL (ref 8.7–10.4)
CALCIUM: 9.2 mg/dL (ref 8.7–10.4)
CHLORIDE: 109 mmol/L — ABNORMAL HIGH (ref 98–107)
CHLORIDE: 109 mmol/L — ABNORMAL HIGH (ref 98–107)
CO2: 26 mmol/L (ref 20.0–31.0)
CO2: 28 mmol/L (ref 20.0–31.0)
CREATININE: 0.65 mg/dL
CREATININE: 0.68 mg/dL
EGFR CKD-EPI (2021) FEMALE: >90 mL/min/{1.73_m2} (ref >=60)
EGFR CKD-EPI (2021) FEMALE: >90 mL/min/{1.73_m2} (ref >=60)
GLUCOSE RANDOM: 114 mg/dL (ref 70–179)
GLUCOSE RANDOM: 116 mg/dL (ref 70–179)
POTASSIUM: 3.5 mmol/L (ref 3.4–4.8)
POTASSIUM: 3.5 mmol/L (ref 3.4–4.8)
PROTEIN TOTAL: 6.3 g/dL (ref 5.7–8.2)
PROTEIN TOTAL: 6.3 g/dL (ref 5.7–8.2)
SODIUM: 142 mmol/L (ref 135–145)
SODIUM: 142 mmol/L (ref 135–145)

## 2022-03-10 LAB — LACTATE DEHYDROGENASE
LACTATE DEHYDROGENASE: 547 U/L — ABNORMAL HIGH (ref 120–246)
LACTATE DEHYDROGENASE: 557 U/L — ABNORMAL HIGH (ref 120–246)

## 2022-03-10 LAB — SLIDE REVIEW

## 2022-03-10 NOTE — Unmapped (Signed)
Nice to see you today.     We discussed the following:      1. Ph+ ALL - Let's plan on the next cycle on the 21st. Enjoy the time off. We will discuss the utility of the methotrexate cycle.     2. Hemorrhoids - Let's see if we can have you see the GI docs this week or right after you admission.     3. Depression - You can restart the Lexapro.      [name], MD  Leukemia Program       Nurse Navigator (non-clinical trial patients): Elicia Lamp, RN        Tel. [phone]       Fax. [phone]  Toll-free appointments: [phone]  Scheduling assistance: [phone]  After hours/weekends: [phone] (ask for adult hematology/oncology on-call)      Lab Results   Component Value Date    WBC 8.7 03/10/2022    HGB 8.6 (L) 03/10/2022    HCT 24.3 (L) 03/10/2022    PLT 265 03/10/2022       Lab Results   Component Value Date    NA 142 03/09/2022    K 3.6 03/09/2022    CL 109 (H) 03/09/2022    CO2 27.0 03/09/2022    BUN 11 03/09/2022    CREATININE 0.60 03/09/2022    GLU 123 03/09/2022    CALCIUM 8.9 03/09/2022    MG 2.0 03/09/2022    PHOS 2.9 03/09/2022       Lab Results   Component Value Date    BILITOT 0.5 03/09/2022    BILIDIR 0.20 03/09/2022    PROT 5.9 03/09/2022    ALBUMIN 3.3 (L) 03/09/2022    ALT 64 (H) 03/09/2022    AST 32 03/09/2022    ALKPHOS 66 03/09/2022       Lab Results   Component Value Date    INR 0.92 11/01/2021    APTT 22.5 (L) 11/01/2021

## 2022-03-11 DIAGNOSIS — C9100 Acute lymphoblastic leukemia not having achieved remission: Principal | ICD-10-CM

## 2022-03-11 DIAGNOSIS — K649 Unspecified hemorrhoids: Principal | ICD-10-CM

## 2022-03-11 MED ORDER — ZARXIO 480 MCG/0.8 ML INJECTION SYRINGE
INTRAMUSCULAR | 3 refills | 0.00000 days | Status: CP
Start: 2022-03-11 — End: 2022-03-11

## 2022-03-11 NOTE — Unmapped (Signed)
[facility] has received the prescription(s) for Sprycel. The triage team has completed the benefits investigation and has determined that the patient is NOT able to fill this medication at the [facility] due to insurance plan limitations. Please see additional information below and re-route the prescription to the preferred pharmacy. Thank you.    PA Required: Yes    Specialty Pharmacy Required:  [facility] - Phone: [phone] and Fax: [phone]

## 2022-03-11 NOTE — Unmapped (Signed)
Hello,    Please scheduled patient EDIT JIMINIAN on 8/21 for a SCHED ADM THROUGH INF     ?? Reason for Admission: Scheduled chemotherapy ALL  ?? Primary Diagnosis:  Encounter for antineoplastic chemotherapy for Hypercvad  ?? Primary Diagnosis ICD-10 Code:  Z51.11 & C91.00  ?? Expected length of stay: 3-5 Days   ?? CPT Code:  [address]   ?? CPT Code Description: Under Injection and Intravenous Infusion Chemotherapy and Other Highly Complex Drug or Highly Complex Biologic Agent Administration   ?? Treating Attending: Senaida Ores  ?? Need for PICC placement in Infusion? No    Infusion Scheduling: Please notify the patient of the appointment date and time once scheduled. Please document this information within this encounter and then route to the navigator prior to closing the encounter.       Scheduled Admissions Assessment        Assessment  Response  Intervention    Type of insurance  Private/ACA/Employer  Financial counselor referral is not warranted this time.     Reliable Trasportation  Yes Referral for Social Work is not warranted this time.   Central Line Access  Yes Patient has line access and does not require an intervention at this time         Thank you,  Frederik Pear

## 2022-03-14 DIAGNOSIS — C9100 Acute lymphoblastic leukemia not having achieved remission: Principal | ICD-10-CM

## 2022-03-14 MED ORDER — FILGRASTIM-AAFI 480 MCG/0.8 ML SUBCUTANEOUS SYRINGE
3 refills | 0 days | Status: CP
Start: 2022-03-14 — End: ?

## 2022-03-14 MED ORDER — ZARXIO 480 MCG/0.8 ML INJECTION SYRINGE
3 refills | 0 days
Start: 2022-03-14 — End: ?

## 2022-03-15 NOTE — Unmapped (Signed)
Patient denied refills for Sprycel at this time, patient has over two weeks of medication on hand due to previous hospital admission. Patient is requesting a call back in two weeks, also ran test claim and received paid claim for $0.00.

## 2022-03-15 NOTE — Unmapped (Signed)
[facility] working [facility]  She can be dis enrolled from our services.

## 2022-03-16 NOTE — Unmapped (Signed)
Updated per SA request.

## 2022-03-17 DIAGNOSIS — C9100 Acute lymphoblastic leukemia not having achieved remission: Principal | ICD-10-CM

## 2022-03-18 NOTE — Unmapped (Signed)
.  Eulah Citizen w/[facility] contacted the Communication Center requesting to speak with the care team of EFSTATHIA YAROCH to discuss:    She is calling for clarification on the prescription Zarxio Syringe 426mcg/0.8ml.  Nivestym also a prefill syringe also the same strength so she needs to know which one to suspense. The fax number to send this is [phone].     Please contact Rinaldo Cloud back  at [phone] asap.        Thank you,   Noland Fordyce  [facility] Communication Center   [phone]

## 2022-03-21 ENCOUNTER — Encounter: Admit: 2022-03-21 | Discharge: 2022-03-26 | Payer: PRIVATE HEALTH INSURANCE

## 2022-03-21 ENCOUNTER — Ambulatory Visit: Admit: 2022-03-21 | Discharge: 2022-03-26 | Payer: PRIVATE HEALTH INSURANCE

## 2022-03-21 ENCOUNTER — Encounter
Admit: 2022-03-21 | Discharge: 2022-03-26 | Disposition: A | Discharge status: Home / Self Care | Payer: PRIVATE HEALTH INSURANCE | Attending: Hematology | Admitting: Hematology

## 2022-03-21 ENCOUNTER — Ambulatory Visit
Admit: 2022-03-21 | Discharge: 2022-03-26 | Disposition: A | Discharge status: Home / Self Care | Payer: PRIVATE HEALTH INSURANCE | Admitting: Hematology

## 2022-03-21 LAB — URINALYSIS WITH MICROSCOPY
BACTERIA: NONE SEEN /HPF
BILIRUBIN UA: NEGATIVE
BLOOD UA: NEGATIVE
GLUCOSE UA: NEGATIVE
HYALINE CASTS: 1 /LPF (ref 0–1)
KETONES UA: NEGATIVE
LEUKOCYTE ESTERASE UA: NEGATIVE
NITRITE UA: NEGATIVE
PH UA: 6.5 (ref 5.0–9.0)
RBC UA: <1 /HPF (ref <=4)
SPECIFIC GRAVITY UA: 1.011 (ref 1.003–1.030)
SQUAMOUS EPITHELIAL: <1 /HPF (ref 0–5)
UROBILINOGEN UA: <2
WBC UA: 3 /HPF (ref 0–5)

## 2022-03-21 LAB — CBC W/ AUTO DIFF
BASOPHILS ABSOLUTE COUNT: 0 10*9/L (ref 0.0–0.1)
BASOPHILS RELATIVE PERCENT: 0.8 %
EOSINOPHILS ABSOLUTE COUNT: 0 10*9/L (ref 0.0–0.5)
EOSINOPHILS RELATIVE PERCENT: 0.1 %
HEMATOCRIT: 24.6 % — ABNORMAL LOW (ref 34.0–44.0)
HEMOGLOBIN: 8.3 g/dL — ABNORMAL LOW (ref 11.3–14.9)
LYMPHOCYTES ABSOLUTE COUNT: 1.2 10*9/L (ref 1.1–3.6)
LYMPHOCYTES RELATIVE PERCENT: 20.6 %
MEAN CORPUSCULAR HEMOGLOBIN CONC: 33.7 g/dL (ref 32.0–36.0)
MEAN CORPUSCULAR HEMOGLOBIN: 33.7 pg — ABNORMAL HIGH (ref 25.9–32.4)
MEAN CORPUSCULAR VOLUME: 100.2 fL — ABNORMAL HIGH (ref 77.6–95.7)
MEAN PLATELET VOLUME: 7.5 fL (ref 6.8–10.7)
MONOCYTES ABSOLUTE COUNT: 0.7 10*9/L (ref 0.3–0.8)
MONOCYTES RELATIVE PERCENT: 12.7 %
NEUTROPHILS ABSOLUTE COUNT: 3.8 10*9/L (ref 1.8–7.8)
NEUTROPHILS RELATIVE PERCENT: 65.8 %
PLATELET COUNT: 183 10*9/L (ref 150–450)
RED BLOOD CELL COUNT: 2.45 10*12/L — ABNORMAL LOW (ref 3.95–5.13)
RED CELL DISTRIBUTION WIDTH: 24.8 % — ABNORMAL HIGH (ref 12.2–15.2)
WBC ADJUSTED: 5.8 10*9/L (ref 3.6–11.2)

## 2022-03-21 LAB — COMPREHENSIVE METABOLIC PANEL
ALBUMIN: 4 g/dL (ref 3.4–5.0)
ALKALINE PHOSPHATASE: 75 U/L (ref 46–116)
ALT (SGPT): 104 U/L — ABNORMAL HIGH (ref 10–49)
ANION GAP: 7 mmol/L (ref 5–14)
AST (SGOT): 71 U/L — ABNORMAL HIGH (ref <=34)
BILIRUBIN TOTAL: 0.7 mg/dL (ref 0.3–1.2)
BLOOD UREA NITROGEN: 13 mg/dL (ref 9–23)
BUN / CREAT RATIO: 21
CALCIUM: 9.3 mg/dL (ref 8.7–10.4)
CHLORIDE: 106 mmol/L (ref 98–107)
CO2: 27 mmol/L (ref 20.0–31.0)
CREATININE: 0.61 mg/dL
EGFR CKD-EPI (2021) FEMALE: >90 mL/min/{1.73_m2} (ref >=60)
GLUCOSE RANDOM: 90 mg/dL (ref 70–179)
POTASSIUM: 3.6 mmol/L (ref 3.4–4.8)
PROTEIN TOTAL: 6.7 g/dL (ref 5.7–8.2)
SODIUM: 140 mmol/L (ref 135–145)

## 2022-03-21 LAB — SLIDE REVIEW

## 2022-03-21 LAB — HCG QUANTITATIVE, BLOOD: GONADOTROPIN, CHORIONIC (HCG) QUANT: <2.6 m[IU]/mL

## 2022-03-21 NOTE — Unmapped (Unsigned)
Patient is a scheduled admit. Here to access double lumen port and collect specimens. Good blood return on both ports.

## 2022-03-21 NOTE — Unmapped (Incomplete)
Hematology/Oncology     Attending Physician :  No att. providers found  Accepting Service  : No service for patient encounter.  Reason for Admission: C5 HyperCVAD, A cycle    Problem List:   Patient Active Problem List   Diagnosis    Leukocytosis    B-cell acute lymphoblastic leukemia (ALL) (CMS-HCC)    Acute cough    Dyspepsia    Acute frontal sinusitis    Pancytopenia (CMS-HCC)    Febrile neutropenia (CMS-HCC)        Assessment/Plan: [name] is an 46 y.o. female who was admitted for C5 A of HyperCVAD + dasatinib.    Ph+ ALL, in remission: Initially diagnosed in 09/2021 and underwent [id number] induction complicated by PICC line associated superficial thrombus. BMBx on 5/2 revealed morphologic CR. C4 was complicated by admission for neutropenic fever with unclear source, though respiratory origin suspected given subtle LLL centrilobular groundglass opacities and patient presented with worsening cough. Discharged 7/28 after resolution of fevers off cefepime. Admitted for Cycle 5A HyperCVAD + Dasatinib. Patient has completed 7 IT chemo treatments prior to admission, with plan to complete one with each cycle d/t patient intolerance of 2/cycle.  - C5D1 HyperCVAD (A cycle) = 03/21/22   - Dasatinib 70 mg daily   - ppx Dapsone and Valtrex   [ ]  IT MTX on 8/22 in [place] - need to place orders     Regimen: R-HyperCVAD  Cycle # 5  Primary Oncologist: Dr. Senaida Ores    Odd Cycles  Date 8/21       Day 1 2 3 4 5    Doxorubicin 50 mg/m2    x    Vincristine 2 mg    x    Cyclophosphamide 300 mg/m2 Q12H X  x X  x X  x     Dexamethasone 40mg  x x x x    Methotrexate IT 12mg   x        - Daily urinalysis, monitor for hemorraghic cystitis  - Monitor for PN and constipation     Disposition: *** all needs to be requested.  - Neulasta scheduled for ***  - Followup lab tests scheduled for ***  - Follow up with *** on ***  - D11-14 Dex, RX on dishcarge   - D11 Vincristine appointment    Migraines: Patient with longstanding history of recurrent unilateral head, neck, sinus pain in addition to postnasal drip and rhinorrhea.  Severe but dull pain that lasts for days, sensitive to light and sound.  Patient has received several courses of Augmentin with concern that this represented sinus infections; however CT sinus performed 7/20 revealed only minimal mucosal thickening of the right maxillary and right sphenoid sinuses.  ENT consult note from recent admission noted no signs of active infection.  Seen by neurology during recent admission who believed this was more consistent with migraine headaches.  They recommended conservative management and outpatient follow-up in neurology clinic.  - Magnesium, riboflavin supplementation  - Heating pads, neck stretches  [ ]  Ensure patient has neurology outpatient follow-up on discharge     Hemorrhoids - Anal fissure: Patient continues to have significant pain from hemorrhoids.  She has trialed Sitz baths, lidocaine-based topical creams, calcium channel blocker creams, hydrocortisone cream without improvement.  Ambulatory GI referral placed on discharge from last hospitalization, however patient has not seen them yet ***.  [ ]  GI surgery consult while inpatient for recommendations     Grade 1 peripheral neuropathy: Paraesthesias present primarily in her  finger pads, currently without medical interventions continue to monitor while on therapy. Not affecting ADLs***.     Anxiety: Reports taking Lexapro 10 mg at home, recently restarted.  - Continue Lexapro 10mg  daily     Cancer related fatigue:  Patient endorses fatigue with onset of cancer symptoms or treatment.  Symptoms started ***weeks ago.  Primary symptoms are ***      Cancer related Pain:  Patient endorses pain associated with {Paingenmedq:79376::Leukemia}.  Pain worsens with *** .  Pain has/has*** not improved with treatment  -Current Regimen***    Immunocompromised status: Patient is immunocompromised secondary to*** disease or chemotherapy  -Antimicrobial prophylaxis as above     Impending Electrolyte Abnormality Secondary to Chemotherapy and/or IV Fluids  -Daily Electrolyte monitoring  -Replete per [facility] guidelines.     {Pancyto:78306}***  - Transfuse 1 unit of pRBCs for hgb >7  - Transfuse 1 unit plt for plts >10K     HPI: Patricia [name] is an 46 y.o. female who presented with ***    Review of Systems: All positive and pertinent negatives are noted in the HPI; otherwise all other systems are negative    Oncologic History:   Primary Oncologist: ***  Oncology History Overview Note   Referring/Local Oncologist:    Diagnosis:   10/29/2021  Bone marrow, left iliac, aspiration and biopsy  -  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 The majority of the abnormal cells (64/88) contained an additional BCR/ABL1 fusion signal, while 9/88 abnormal cells contained an additional ABL1 and ASS1 signal.       Genetics:   Karyotype/FISH:   RESULTS   Date Value Ref Range Status   10/28/2021   Final    NOTE: This report reflects a combined study from a peripheral blood and a bone marrow core biopsy. Eleven cells from the peripheral blood and nine cells from the bone marrow core biopsy were analyzed. The BCR/ABL1 FISH analysis was performed on the peripheral blood.     Abnormal Karyotype:  47,XX,t(9;22)(q34;q11.2),+der(22)t(9;22)[18]/46,XX[2]    Abnormal FISH:  A BCR/ABL1 interphase FISH assay showed a signal pattern consistent with a BCR::ABL1 rearrangement and the 9;22 translocation in 88% of the 100 cells scored. The majority of the abnormal cells (64/88) contained an additional BCR/ABL1 fusion signal, while 9/88 abnormal cells contained an additional ABL1 and ASS1 signal.           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  MRD by flow insufficient, p190 2/100,000  12/10/2021: Cycle 2 B cycle + rituximab  12/14/21: IT#4  12/17/21: IT#5  01/10/22: Cycle 3 A cycle + rituximab  01/24/22: Bmbx - MRD-neg by PCR (p190)   ~02/09/22: PLAN C4          B-cell acute lymphoblastic leukemia (ALL) (CMS-HCC)   10/29/2021 Initial Diagnosis    B-cell acute lymphoblastic leukemia (ALL) (CMS-HCC)  10/30/2021 -  Chemotherapy    IP/OP LEUKEMIA GRAAPH-2005 < 60 YO (OP PEGFILGRASTIM ON DAY 7)  [No description for this plan]     01/06/2022 Endocrine/Hormone Therapy    OP LEUPROLIDE (LUPRON) 11.25 MG EVERY 3 MONTHS  Plan Provider: [name], MD         Medical History:  PCP: [name], MD  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    Surgical History:  Past Surgical History:   Procedure Laterality Date    IR INSERT PORT AGE GREATER THAN 5 YRS  12/08/2021    IR INSERT PORT AGE GREATER THAN 5 YRS 12/08/2021 [name], PA IMG [facility]      Social History:  Social History     Socioeconomic History    Marital status: Married   Tobacco Use    Smoking status: Never    Smokeless tobacco: Never     Social Determinants of Health     Financial Resource Strain: Low Risk  (11/02/2021)    Overall Financial Resource Strain (CARDIA)     Difficulty of Paying Living Expenses: Not hard at all   Food Insecurity: No Food Insecurity (11/02/2021)    Hunger Vital Sign     Worried About Running Out of Food in the Last Year: Never true     Ran Out of Food in the Last Year: Never true   Transportation Needs: No Transportation Needs (11/02/2021)    PRAPARE - Therapist, art (Medical): No     Lack of Transportation (Non-Medical): No      Family History:   family history is not on file.***  {No pertinent family history does NOT suffice.  If family history is negative, then note needs to include something like no family history of blood disorders or no family history of cancer. This text will disappear when you sign the note.:75688}     Allergies: is allergic to erythromycin, other, sulfa (sulfonamide antibiotics), and azithromycin.    Medications:   Meds:  Continuous Infusions:  PRN Meds:.    Objective:   Vitals: @VSRANGES @    Physical Exam:  General: Resting, in no apparent distress, lying in bed  HEENT:  PERRL. No scleral icterus or conjunctival injection. MMM without ulceration, erythema or exudate. No cervical or axillary lymphadenopathy.   Heart:  RRR. S1, S2. No murmurs, gallops, or rubs.  Lungs:  Breathing is unlabored, and patient is speaking full sentences with ease.  No stridor.  CTAB. No rales, ronchi, or crackles.    Abdomen:  No distention or pain on palpation.  Bowel sounds are present and normoactive x 4.  No palpable hepatomegaly or splenomegaly.  No palpable masses.  Skin:  No rashes, petechiae or purpura.  No areas of skin breakdown. Warm to touch, dry, smooth, and even.  Musculoskeletal:  No grossly-evident joint effusions or deformities.  Range of motion about the shoulder, elbow, hips and knees is grossly normal.  Psychiatric:  Range of affect is appropriate.    Neurologic:  Alert and oriented to person, place, time and situation.  Gait is normal.  No Nystagmus Cerebellar tasks (finger-to-nose, rapid hand movement, heel along shin) are completed with ease and are symmetric. CNII-CNXII grossly intact.  Extremities:  Appear well-perfused. No clubbing, edema, or cyanosis.  CVAD: R CW Port - no erythema, nontender; dressing CDI.      Test Results  No  results for input(s): WBC, NEUTROABS, HGB, PLT in the last 72 hours.  No results for input(s): NA, K, CL, CO2, BUN, CREATININE, MG, PHOS, CALCIUM in the last 72 hours.    Imaging: {GCS Imaging findings:30421597}    DVT PPX Indicated: {yes no:314532}, {plan; dvt prophylaxis:16689}  FEN:  Discharge Plan:  - fluids: {yes no:314532}  - electrolytes: stable ***  - diet: regular ***    Need for PT: {yes no:314532}  Anticipated Discharge: {dispo:84474::Their home}    Code Status: @PATFPLSTATCODE @   @RRCODESTATUS @  ***    I personally spent *** minutes face-to-face and non-face-to-face in the care of this patient, which includes all pre, intra, and post visit time on the date of service.  All documented time was specific to the E/M visit and does not include any procedures that may have been performed.

## 2022-03-22 LAB — HEPATIC FUNCTION PANEL
ALBUMIN: 3.7 g/dL (ref 3.4–5.0)
ALKALINE PHOSPHATASE: 74 U/L (ref 46–116)
ALT (SGPT): 91 U/L — ABNORMAL HIGH (ref 10–49)
AST (SGOT): 58 U/L — ABNORMAL HIGH (ref <=34)
BILIRUBIN DIRECT: 0.2 mg/dL (ref 0.00–0.30)
BILIRUBIN TOTAL: 0.8 mg/dL (ref 0.3–1.2)
PROTEIN TOTAL: 6.3 g/dL (ref 5.7–8.2)

## 2022-03-22 LAB — BASIC METABOLIC PANEL
ANION GAP: 7 mmol/L (ref 5–14)
BLOOD UREA NITROGEN: 15 mg/dL (ref 9–23)
BUN / CREAT RATIO: 23
CALCIUM: 9.1 mg/dL (ref 8.7–10.4)
CHLORIDE: 111 mmol/L — ABNORMAL HIGH (ref 98–107)
CO2: 24 mmol/L (ref 20.0–31.0)
CREATININE: 0.65 mg/dL
EGFR CKD-EPI (2021) FEMALE: >90 mL/min/{1.73_m2} (ref >=60)
GLUCOSE RANDOM: 175 mg/dL (ref 70–179)
POTASSIUM: 4.1 mmol/L (ref 3.4–4.8)
SODIUM: 142 mmol/L (ref 135–145)

## 2022-03-22 LAB — CBC W/ AUTO DIFF
BASOPHILS ABSOLUTE COUNT: 0.1 10*9/L (ref 0.0–0.1)
BASOPHILS RELATIVE PERCENT: 0.6 %
EOSINOPHILS ABSOLUTE COUNT: 0 10*9/L (ref 0.0–0.5)
EOSINOPHILS RELATIVE PERCENT: 0 %
HEMATOCRIT: 25 % — ABNORMAL LOW (ref 34.0–44.0)
HEMOGLOBIN: 8.5 g/dL — ABNORMAL LOW (ref 11.3–14.9)
LYMPHOCYTES ABSOLUTE COUNT: 1.6 10*9/L (ref 1.1–3.6)
LYMPHOCYTES RELATIVE PERCENT: 14.8 %
MEAN CORPUSCULAR HEMOGLOBIN CONC: 33.9 g/dL (ref 32.0–36.0)
MEAN CORPUSCULAR HEMOGLOBIN: 34.5 pg — ABNORMAL HIGH (ref 25.9–32.4)
MEAN CORPUSCULAR VOLUME: 101.9 fL — ABNORMAL HIGH (ref 77.6–95.7)
MEAN PLATELET VOLUME: 7.2 fL (ref 6.8–10.7)
MONOCYTES ABSOLUTE COUNT: 0.4 10*9/L (ref 0.3–0.8)
MONOCYTES RELATIVE PERCENT: 4 %
NEUTROPHILS ABSOLUTE COUNT: 8.6 10*9/L — ABNORMAL HIGH (ref 1.8–7.8)
NEUTROPHILS RELATIVE PERCENT: 80.6 %
PLATELET COUNT: 130 10*9/L — ABNORMAL LOW (ref 150–450)
RED BLOOD CELL COUNT: 2.45 10*12/L — ABNORMAL LOW (ref 3.95–5.13)
RED CELL DISTRIBUTION WIDTH: 24.9 % — ABNORMAL HIGH (ref 12.2–15.2)
WBC ADJUSTED: 10.6 10*9/L (ref 3.6–11.2)

## 2022-03-22 LAB — URINALYSIS WITH MICROSCOPY
BACTERIA: NONE SEEN /HPF
BILIRUBIN UA: NEGATIVE
GLUCOSE UA: NEGATIVE
KETONES UA: 10 — AB
NITRITE UA: NEGATIVE
PH UA: 6 (ref 5.0–9.0)
PROTEIN UA: NEGATIVE
RBC UA: <1 /HPF (ref <=4)
SPECIFIC GRAVITY UA: 1.003 (ref 1.003–1.030)
SQUAMOUS EPITHELIAL: <1 /HPF (ref 0–5)
UROBILINOGEN UA: <2
WBC UA: 3 /HPF (ref 0–5)

## 2022-03-22 LAB — MAGNESIUM: MAGNESIUM: 2 mg/dL (ref 1.6–2.6)

## 2022-03-22 LAB — PHOSPHORUS: PHOSPHORUS: 2.9 mg/dL (ref 2.4–5.1)

## 2022-03-22 MED ADMIN — OLANZapine (ZyPREXA) tablet 10 mg: 10 mg | ORAL | @ 03:00:00 | Stop: 2022-03-25

## 2022-03-22 MED ADMIN — oxyCODONE (ROXICODONE) immediate release tablet 5 mg: 5 mg | ORAL | @ 05:00:00 | Stop: 2022-04-04

## 2022-03-22 MED ADMIN — dasatinib (SPRYCEL) tablet 70 mg: 70 mg | ORAL | @ 02:00:00

## 2022-03-22 MED ADMIN — cycloPHOSphamide (CYTOXAN) 585 mg in sodium chloride (NS) 0.9 % 250 mL IVPB: 300 mg/m2 | INTRAVENOUS | @ 16:00:00 | Stop: 2022-03-24

## 2022-03-22 MED ADMIN — sodium chloride (NS) 0.9 % flush 10 mL: 10 mL | INTRAVENOUS | @ 01:00:00

## 2022-03-22 MED ADMIN — dapsone tablet 100 mg: 100 mg | ORAL | @ 14:00:00

## 2022-03-22 MED ADMIN — valACYclovir (VALTREX) tablet 500 mg: 500 mg | ORAL | @ 23:00:00

## 2022-03-22 MED ADMIN — mesna (MESNEX) 1,170 mg in sodium chloride (NS) 0.9 % 1,000 mL IVPB: 600 mg/m2 | INTRAVENOUS | @ 03:00:00 | Stop: 2022-03-24

## 2022-03-22 MED ADMIN — cycloPHOSphamide (CYTOXAN) 585 mg in sodium chloride (NS) 0.9 % 250 mL IVPB: 300 mg/m2 | INTRAVENOUS | @ 03:00:00 | Stop: 2022-03-24

## 2022-03-22 MED ADMIN — sodium chloride (NS) 0.9 % flush 10 mL: 10 mL | INTRAVENOUS | @ 14:00:00

## 2022-03-22 MED ADMIN — sodium chloride (NS) 0.9 % infusion: 100 mL/h | INTRAVENOUS | @ 03:00:00 | Stop: 2022-03-24

## 2022-03-22 MED ADMIN — LORazepam (ATIVAN) 2 mg/mL injection: INTRAVENOUS | @ 18:00:00 | Stop: 2022-03-22

## 2022-03-22 MED ADMIN — cetirizine (ZyrTEC) tablet 10 mg: 10 mg | ORAL | @ 14:00:00

## 2022-03-22 MED ADMIN — methotrexate (PRESERVATIVE FREE) 12 mg, hydrocortisone sod succ (Solu-CORTEF) 50 mg in sodium chloride (NS) 0.9 % 4.52 mL INTRATHECAL syringe: INTRATHECAL | @ 18:00:00 | Stop: 2022-03-22

## 2022-03-22 MED ADMIN — sodium chloride (NS) 0.9 % infusion: 150 mL/h | INTRAVENOUS | @ 01:00:00 | Stop: 2022-03-21

## 2022-03-22 MED ADMIN — dasatinib (SPRYCEL) tablet 70 mg: 70 mg | ORAL | @ 23:00:00

## 2022-03-22 MED ADMIN — valACYclovir (VALTREX) tablet 500 mg: 500 mg | ORAL | @ 01:00:00

## 2022-03-22 MED ADMIN — ondansetron (ZOFRAN) tablet 24 mg: 24 mg | ORAL | @ 03:00:00 | Stop: 2022-03-25

## 2022-03-22 MED ADMIN — dexAMETHasone (DECADRON) tablet 40 mg: 40 mg | ORAL | @ 02:00:00 | Stop: 2022-03-25

## 2022-03-22 MED ADMIN — LORazepam (ATIVAN) injection 1 mg: 1 mg | INTRAVENOUS | @ 18:00:00 | Stop: 2022-03-22

## 2022-03-22 NOTE — Unmapped (Signed)
PHYSICAL THERAPY  Evaluation (03/22/22 0850)          Patient Name:  Patricia [name]       Medical Record Number: [id number]   Date of Birth: Mar 11, 1976  Sex: Female        Treatment Diagnosis: decreased endurance     Activity Tolerance: Limited secondary to medical complications (symptomatic tachycardia)     ASSESSMENT  Problem List: Decreased endurance      Assessment : Patricia [name] is a 46 y.o. with Ph+ALL in MRD-neg CR admitted for HyperCVAD.     Patricia [name] presents to acute PT services with ongoing deficits in endurance but remains independent with functional mobility. Her primary limitation today was tachycardia- HR up to 147 after ambulating a total of 16' in the room. She has been experiencing tachycardia since her previous admission. She will benefit from ongoing acute PT services to prevent further deconditioning but has no anticipated post acute PT needs at this time. After a review of the personal factors, comorbidities, clinical presentation, and examination of the number of affected body systems, the patient presents as a low complexity case.      Today's Interventions: PT Eval, pt education re: PT role, POC, activity pacing, HR parameters, monitoring for symptoms                        PLAN  Planned Frequency of Treatment:  1-2x per day for: 2-3x week       Planned Interventions: Education - Patient, Education - Family / caregiver, Endurance activities, Home exercise program, Museum/gallery curator, Therapeutic exercise, Therapeutic activity, Functional mobility, Self-care / Home training     Post-Discharge Physical Therapy Recommendations:  PT Post Acute Discharge Recommendations: Skilled PT services NOT indicated      PT DME Recommendations: None            Goals:   Patient and Family Goals: to be able to walk in the hallway, improve her endurance.     Long Term Goal #1: Pt will ambulate 500' IND in 4 weeks with VSS.        SHORT GOAL #1: Pt will ambulate 100' IND with VSS.               Time Frame : 2 weeks Prognosis:  Good  Positive Indicators: PLOF, caregiver support, motivation  Barriers to Discharge: None     SUBJECTIVE  Patient reports: Pt agreeable to PT  Current Functional Status: Session began and ended with pt in bed, needs in reach, RN aware     Prior Functional Status: Pt is IND with ambulation. 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 She also drove and took her dog to the vet one day. She is able to sense when she is tachycardic and uses her apple watch to track it. She can only ambulate very short distances before she is symptomatic. She hopes to return to work in Jan 2024.  Equipment available at home: Shower Chair without back      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            Social History     Tobacco Use    Smoking status: Never    Smokeless tobacco: Never   Substance Use Topics    Alcohol use: Not on file  Past Surgical History:   Procedure Laterality Date    IR INSERT PORT AGE GREATER THAN 5 YRS  12/08/2021    IR INSERT PORT AGE GREATER THAN 5 YRS 12/08/2021 [name], PA IMG [facility]             History reviewed. No pertinent family history.     Allergies: Erythromycin, Other, Sulfa (sulfonamide antibiotics), and Azithromycin                  Objective Findings  Precautions / Restrictions  Precautions: Chemo precautions  Weight Bearing Status: Non-applicable  Required Braces or Orthoses: Non-applicable     Communication Preference: Verbal          Pain Comments: denied  Medical Tests / Procedures: chart reviewed  Equipment / Environment: Vascular access (PIV, TLC, Port-a-cath, PICC)           Living Situation  Living Environment: House  Lives With: Spouse  Home Living: One level home, Stairs to enter without rails, Walk-in shower, Shower chair without back  Number of Stairs to Enter (outside): 1      Cognition: WFL  Visual/Perception: Wears Glasses/Contacts           Upper Extremities  UE Strength: Right WFL, Left WFL    Lower Extremities  LE Strength: Left WFL, Right WFL     Sensation: Impaired  Motor/Sensory/Neuro Comments: pt has baseline tingling/neuropathy in fingertips, unchanged.    Dynamic Sitting-Level of Assistance: Independent    Dynamic Standing - Level of Assistance: Independent      Bed Mobility: Supine to Sit  Supine to Sit assistance level: Independent     Transfers: Sit to Stand  Sit to Stand assistance level: Independent      Gait Level of Assistance: Independent  Gait Distance Ambulated (ft): 16 ft  Gait: pt ambulated 8' x2 (to/from bathroom) IND.     Stairs: Not assessed            Endurance: fair, limited by tachycardia and fatigue.     Physical Therapy Session Duration  PT Individual [mins]: 11  PT Co-Treatment [mins]: 34  Reason for Co-treatment: Poor activity tolerance     Medical Staff Made Aware: RN and PA Vernona Rieger     I attest that I have reviewed the above information.  Signed: Salome Holmes, PT  Filed 03/22/2022

## 2022-03-22 NOTE — Unmapped (Signed)
Hematology/Oncology     Attending Physician :  [name], MD  Accepting Service  : Oncology/Hematology (MDE)  Reason for Admission: C5 HyperCVAD, A cycle    Problem List:   Patient Active Problem List   Diagnosis    Leukocytosis    B-cell acute lymphoblastic leukemia (ALL) (CMS-HCC)    Acute cough    Dyspepsia    Acute frontal sinusitis    Pancytopenia (CMS-HCC)    Febrile neutropenia (CMS-HCC)        Assessment/Plan: Patricia [name] is an 46 y.o. female who was admitted for C5 A of HyperCVAD + dasatinib.    Ph+ ALL, in remission: Initially diagnosed in 09/2021 and underwent [id number] induction complicated by PICC line associated superficial thrombus. BMBx on 5/2 revealed morphologic CR. C4 was complicated by admission for neutropenic fever with unclear source, though respiratory origin suspected given subtle LLL centrilobular groundglass opacities and patient presented with worsening cough. Discharged 7/28 after resolution of fevers off cefepime. Admitted for Cycle 5A HyperCVAD + Dasatinib. Patient has completed 7 IT chemo treatments prior to admission, with plan to complete one with each cycle d/t patient intolerance of 2/cycle.  - C5D1 HyperCVAD (A cycle) = 03/21/22   - Dasatinib 70 mg daily   - ppx Dapsone and Valtrex   [ ]  IT MTX on 8/22 in [place] - need to place orders     Regimen: R-HyperCVAD  Cycle # 5  Primary Oncologist: Dr. Senaida Ores    Odd Cycles  Date 8/21       Day 1 2 3 4 5    Doxorubicin 50 mg/m2    x    Vincristine 2 mg    x    Cyclophosphamide 300 mg/m2 Q12H X  x X  x X  x     Dexamethasone 40mg  x x x x    Methotrexate IT 12mg   x        - Daily urinalysis, monitor for hemorraghic cystitis  - Monitor for PN and constipation     Disposition:  all needs to be requested.  - Neulasta scheduled for 8/26  - Followup lab tests scheduled for 8/  - Follow up with Dr. Senaida Ores prior to next cycle needs to be requested  - D11-14 Dex, RX on dishcarge   - D11 Vincristine appointment needs to be requested    Migraines: Patient with longstanding history of recurrent unilateral head, neck, sinus pain in addition to postnasal drip and rhinorrhea.  Severe but dull pain that lasts for days, sensitive to light and sound.  Patient has received several courses of Augmentin with concern that this represented sinus infections; however CT sinus performed 7/20 revealed only minimal mucosal thickening of the right maxillary and right sphenoid sinuses.  ENT consult note from recent admission noted no signs of active infection.  Seen by neurology during recent admission who believed this was more consistent with migraine headaches.  They recommended conservative management and outpatient follow-up in neurology clinic.  - Magnesium, riboflavin supplementation  - Heating pads, neck stretches  [ ]  Ensure patient has neurology outpatient follow-up on discharge     Hemorrhoids - Anal fissure: Patient continues to have significant pain from hemorrhoids.  She has trialed Sitz baths, lidocaine-based topical creams, calcium channel blocker creams, hydrocortisone cream without improvement.  Ambulatory GI referral placed on discharge from last hospitalization, however patient has not seen them yet .  [ ]  GI surgery consult while inpatient for recommendations, will consult in AM when  patient in a private room     Grade 1 peripheral neuropathy: Paraesthesias present primarily in her finger pads, currently without medical interventions continue to monitor while on therapy. Not affecting ADLs.     Anxiety: Reports taking Lexapro 10 mg at home, recently restarted.  - Continue Lexapro 10mg  daily     Cancer related fatigue:  Patient endorses fatigue with onset of cancer symptoms or treatment.  Symptoms started  with diagnosis weeks ago.  Primary symptoms are activity intolerance and fatigue       Immunocompromised status: Patient is immunocompromised secondary to ALL disease or chemotherapy  -Antimicrobial prophylaxis as above Impending Electrolyte Abnormality Secondary to Chemotherapy and/or IV Fluids  -Daily Electrolyte monitoring  -Replete per [facility] guidelines.     Impending Pancytopenia secondary to Acute Leukemia/chemotherapy:   - Transfuse 1 unit of pRBCs for hgb >7  - Transfuse 1 unit plt for plts >10K     HPI: Patricia [name] is an 46 y.o. female who presents for consideration of Cycle 5A of HyperCVAD. Patient reports feeling generally well without acute complaints.  Patient does endores ongoing fatigue which is slightly worse after last cycle of chemotherapy.    Denies headache, changes in vision, sore throat, cough, SOB, chest pain, abdominal pain, N/V/D/C, changes in urination, fevers, night sweats or chills.       Review of Systems: All positive and pertinent negatives are noted in the HPI; otherwise all other systems are negative    Oncologic History:   Primary Oncologist: Senaida Ores  Oncology History Overview Note   Referring/Local Oncologist:    Diagnosis:   10/29/2021  Bone marrow, left iliac, aspiration and biopsy  -  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 The majority of the abnormal cells (64/88) contained an additional BCR/ABL1 fusion signal, while 9/88 abnormal cells contained an additional ABL1 and ASS1 signal.       Genetics:   Karyotype/FISH:   RESULTS   Date Value Ref Range Status   10/28/2021   Final    NOTE: This report reflects a combined study from a peripheral blood and a bone marrow core biopsy. Eleven cells from the peripheral blood and nine cells from the bone marrow core biopsy were analyzed. The BCR/ABL1 FISH analysis was performed on the peripheral blood.     Abnormal Karyotype:  47,XX,t(9;22)(q34;q11.2),+der(22)t(9;22)[18]/46,XX[2]    Abnormal FISH:  A BCR/ABL1 interphase FISH assay showed a signal pattern consistent with a BCR::ABL1 rearrangement and the 9;22 translocation in 88% of the 100 cells scored. The majority of the abnormal cells (64/88) contained an additional BCR/ABL1 fusion signal, while 9/88 abnormal cells contained an additional ABL1 and ASS1 signal.           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  MRD by flow insufficient, p190 2/100,000  12/10/2021: Cycle 2 B cycle + rituximab  12/14/21: IT#4  12/17/21: IT#5  01/10/22: Cycle 3 A cycle + rituximab  01/24/22: Bmbx - MRD-neg by PCR (p190)   ~02/09/22: PLAN C4          B-cell acute lymphoblastic leukemia (ALL) (CMS-HCC)   10/29/2021 Initial Diagnosis    B-cell acute lymphoblastic leukemia (ALL) (CMS-HCC)     10/30/2021 -  Chemotherapy    IP/OP LEUKEMIA GRAAPH-2005 < 60 YO (OP PEGFILGRASTIM ON DAY 7)  [No description for this plan]     01/06/2022 Endocrine/Hormone Therapy    OP LEUPROLIDE (LUPRON) 11.25 MG EVERY 3 MONTHS  Plan Provider: [name], MD         Medical History:  PCP: [name], MD  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    Surgical History:  Past Surgical History:   Procedure Laterality Date    IR INSERT PORT AGE GREATER THAN 5 YRS  12/08/2021    IR INSERT PORT AGE GREATER THAN 5 YRS 12/08/2021 [name], PA IMG VIR [facility]      Social History:  Social History     Socioeconomic History    Marital status: Married   Tobacco Use    Smoking status: Never    Smokeless tobacco: Never     Social Determinants of Health Financial Resource Strain: Low Risk  (11/02/2021)    Overall Financial Resource Strain (CARDIA)     Difficulty of Paying Living Expenses: Not hard at all   Food Insecurity: No Food Insecurity (11/02/2021)    Hunger Vital Sign     Worried About Running Out of Food in the Last Year: Never true     Ran Out of Food in the Last Year: Never true   Transportation Needs: No Transportation Needs (11/02/2021)    PRAPARE - Therapist, art (Medical): No     Lack of Transportation (Non-Medical): No      Family History:   family history is not on file.       Allergies: is allergic to erythromycin, other, sulfa (sulfonamide antibiotics), and azithromycin.    Medications:   Meds:  Continuous Infusions:  PRN Meds:.    Objective:   Vitals: @VSRANGES @    Physical Exam:  General: Resting, in no apparent distress, lying in bed  HEENT:  PERRL. No scleral icterus or conjunctival injection. MMM without ulceration, erythema or exudate. No cervical or axillary lymphadenopathy.   Heart:  RRR. S1, S2. No murmurs, gallops, or rubs.  Lungs:  Breathing is unlabored, and patient is speaking full sentences with ease.  No stridor.  CTAB. No rales, ronchi, or crackles.    Abdomen:  No distention or pain on palpation.  Bowel sounds are present and normoactive x 4.  No palpable hepatomegaly or splenomegaly.  No palpable masses.  Skin:  No rashes, petechiae or purpura.  No areas of skin breakdown. Warm to touch, dry, smooth, and even.  Musculoskeletal:  No grossly-evident joint effusions or deformities.  Range of motion about the shoulder, elbow, hips and knees is grossly normal.  Psychiatric:  Range of affect is appropriate.    Neurologic:  Alert and oriented to person, place, time and situation.  Gait is normal.  No Nystagmus Cerebellar tasks (finger-to-nose, rapid hand movement, heel along shin) are completed with ease and are symmetric. CNII-CNXII grossly intact.  Extremities:  Appear well-perfused. No clubbing, edema, or cyanosis.  CVAD: R CW Port - no erythema,  nontender; dressing CDI.      Test Results  Recent Labs     03/21/22  1409   WBC 5.8   NEUTROABS 3.8   HGB 8.3*   PLT 183     Recent Labs     03/21/22  1409   NA 140   K 3.6   CL 106   CO2 27.0   BUN 13   CREATININE 0.61   CALCIUM 9.3       Imaging: Radiology studies were personally reviewed    DVT PPX Indicated: no,  Ambulation  FEN:  Discharge Plan:  - fluids: yes  - electrolytes: stable   - diet: regular     Need for PT: yes  Anticipated Discharge: Their home    Code Status: Full Code confirmed on admission    Patricia [name]  Nurse Practitioner  Hematology/Oncology  [facility]    Group pager: [phone]      I personally spent 75 minutes face-to-face and non-face-to-face in the care of this patient, which includes all pre, intra, and post visit time on the date of service.  All documented time was specific to the E/M visit and does not include any procedures that may have been performed.

## 2022-03-22 NOTE — Unmapped (Signed)
[facility]   Care Management     Patient is a 46 y.o. admitted on 03/21/2022 for B-cell acute lymphoblastic leukemia (ALL) (CMS-HCC) [C91.00]. Per review of the medical record and discussion with the treating team, the patient does not meet indicators for a full assessment at this time. CM will continue to assess for discharge needs and follow up, as indicated.    Update: Per CAPP rounds, plan to DC on 08/26, GI consult, no CM needs anticipated.    Patricia [name] March 22, 2022 4:12 PM

## 2022-03-22 NOTE — Unmapped (Signed)
Daily Progress Note      Active Problems:    * No active hospital problems. *       LOS: 1 day     Assessment/Plan:Patricia [name] is an 46 y.o. female who was admitted for C5 A of HyperCVAD + dasatinib.    Plan Summary: D2=8/22 of HyperCVAD+Dasatinib. LP/IT chemo in [place] today, results pending. New vaginal lesion, HSV swab obtained. GI surgery consulted for hemorrhoid/anal fissure management. Asymptomatic and intermittent tachycardia. Neupogen x 7 days on discharge.      Ph+ ALL, in remission: Initially diagnosed in 09/2021 and underwent [id number] induction complicated by PICC line associated superficial thrombus. BMBx on 5/2 revealed morphologic CR. C4 was complicated by admission for neutropenic fever with unclear source, though respiratory origin suspected given subtle LLL centrilobular groundglass opacities and patient presented with worsening cough. Discharged 7/28 after resolution of fevers off cefepime. Admitted for Cycle 5A HyperCVAD + Dasatinib. Patient has completed 7 IT chemo treatments prior to admission, with plan to complete one with each cycle d/t patient intolerance of 2/cycle.  - C5D1 HyperCVAD (A cycle) = 03/21/22   - Dasatinib 70 mg daily   - ppx Dapsone and Valtrex   - IT MTX on 8/22 in [place] - pending      Regimen: R-HyperCVAD  Cycle # 5  Primary Oncologist: Dr. Senaida Ores     Odd Cycles  Date 8/21           Day 1 2 3 4 5    Doxorubicin 50 mg/m2       x     Vincristine 2 mg       x     Cyclophosphamide 300 mg/m2 Q12H X  x X  x X  x       Dexamethasone 40mg  x x x x     Methotrexate IT 12mg    x            - Daily urinalysis, monitor for hemorraghic cystitis  - Monitor for PN and constipation      Disposition:  all needs to be requested.  - Neupogen self injection x 7 days   - Followup lab tests   - Follow up with Dr. Senaida Ores   - D11-14 Dex, RX on dishcarge   - D11 Vincristine appointment needs to be requested     Migraines: Patient with longstanding history of recurrent unilateral head, neck, sinus pain in addition to postnasal drip and rhinorrhea.  Severe but dull pain that lasts for days, sensitive to light and sound.  Patient has received several courses of Augmentin with concern that this represented sinus infections; however CT sinus performed 7/20 revealed only minimal mucosal thickening of the right maxillary and right sphenoid sinuses.  ENT consult note from recent admission noted no signs of active infection.  Seen by neurology during recent admission who believed this was more consistent with migraine headaches.  They recommended conservative management and outpatient follow-up in neurology clinic.  - Magnesium, riboflavin supplementation  - Heating pads, neck stretches  [ ]  Ensure patient has neurology outpatient follow-up on discharge     Hemorrhoids - Anal fissure: Patient continues to have significant pain from hemorrhoids.  She has trialed Sitz baths, lidocaine-based topical creams, calcium channel blocker creams, hydrocortisone cream without improvement.  Ambulatory GI referral placed on discharge from last hospitalization, however patient has not seen them yet .  - GI surgery consult      Grade 1 peripheral neuropathy: Paraesthesias present primarily in  her finger pads, currently without medical interventions continue to monitor while on therapy. Not affecting ADLs.     Anxiety: Reports taking Lexapro 10 mg at home, recently restarted.  - Continue Lexapro 10mg  daily     Cancer related fatigue:  Patient endorses fatigue with onset of cancer symptoms or treatment.  Symptoms started  with diagnosis weeks ago.  Primary symptoms are activity intolerance and fatigue        Immunocompromised status: Patient is immunocompromised secondary to ALL disease or chemotherapy  -Antimicrobial prophylaxis as above     Impending Electrolyte Abnormality Secondary to Chemotherapy and/or IV Fluids  -Daily Electrolyte monitoring  -Replete per [facility] guidelines.      Impending Pancytopenia secondary to Acute Leukemia/chemotherapy:   - Transfuse 1 unit of pRBCs for hgb >7  - Transfuse 1 unit plt for plts >10K       Nutrition:                        Subjective:   Afebrile, [name]. Reports episode of heart racing overnight and again when working with PT. Reports this has been a long standing intermittent issue. Denies any associated symptoms.     10 point ROS otherwise negative except as above in the HPI.     Objective:     Vital signs in last 24 hours:  Temp:  [36.7 ??C (98.1 ??F)-37.3 ??C (99.2 ??F)] 36.7 ??C (98.1 ??F)  Heart Rate:  [100-120] 117  Resp:  [18] 18  BP: (108-128)/(67-81) 108/69  MAP (mmHg):  [81-97] 81  SpO2:  [92 %-93 %] 92 %    Intake/Output last 3 shifts:  No intake/output data recorded.    Meds:  Current Facility-Administered Medications   Medication Dose Route Frequency Provider Last Rate Last Admin    cetirizine (ZyrTEC) tablet 10 mg  10 mg Oral Nightly Herbert Moors, AGNP   10 mg at 03/22/22 6045    CHEMO CLARIFICATION ORDER   Other Continuous PRN Maryanna Shape, PA        cycloPHOSphamide (CYTOXAN) 585 mg in sodium chloride (NS) 0.9 % 250 mL IVPB  300 mg/m2 (Treatment Plan Recorded) Intravenous Q12H [name], MD   Stopped at 03/22/22 0140    dapsone tablet 100 mg  100 mg Oral Daily Herbert Moors, AGNP   100 mg at 03/22/22 4098    dasatinib (SPRYCEL) tablet 70 mg  70 mg Oral Daily [name], MD   70 mg at 03/21/22 2207    dexAMETHasone (DECADRON) tablet 40 mg  40 mg Oral Q24H [name], MD   40 mg at 03/21/22 2207    diphenhydrAMINE (BENADRYL) injection 25 mg  25 mg Intravenous Q4H PRN [name], MD        Melene Muller ON 03/24/2022] DOXOrubicin (ADRIAMYCIN) 97.5 mg in sodium chloride (NS) 0.9 % 1,000 mL IVPB  50 mg/m2 (Treatment Plan Recorded) Intravenous Once [name], MD        Dr Silvana Newness Hemorrhoid & Fissure Ointment *Patient Home supplied*  1 application. Topical TID PRN Herbert Moors, AGNP        emollient combination no.92 (LUBRIDERM) lotion 1 application.  1 application. Topical Q1H PRN Herbert Moors, AGNP        EPINEPHrine [facility]) injection 0.3 mg  0.3 mg Intramuscular Daily PRN [name], MD        famotidine (PF) (PEPCID) injection 20 mg  20 mg Intravenous  Q4H PRN [name], MD        IP OKAY TO TREAT   Other Continuous PRN Herbert Moors, AGNP        loperamide (IMODIUM) capsule 2 mg  2 mg Oral Q2H PRN Herbert Moors, AGNP        loperamide (IMODIUM) capsule 4 mg  4 mg Oral Once PRN Herbert Moors, AGNP        meperidine (DEMEROL) injection 25 mg  25 mg Intravenous Q30 Min PRN [name], MD        mesna (MESNEX) 1,170 mg in sodium chloride (NS) 0.9 % 1,000 mL IVPB  600 mg/m2 (Treatment Plan Recorded) Intravenous Q24H [name], MD 44.2 mL/hr at 03/21/22 2321 1,170 mg at 03/21/22 2321    methotrexate (PRESERVATIVE FREE) 12 mg, hydrocortisone sod succ (Solu-CORTEF) 50 mg in sodium chloride (NS) 0.9 % 4.52 mL INTRATHECAL syringe   Intrathecal Once [name], MD        methylPREDNISolone sodium succinate (PF) (SOLU-medrol) injection 125 mg  125 mg Intravenous Q4H PRN [name], MD        OLANZapine (ZyPREXA) tablet 10 mg  10 mg Oral Q24H [name], MD   10 mg at 03/21/22 2249    ondansetron (ZOFRAN) tablet 24 mg  24 mg Oral Q24H [name], MD   24 mg at 03/21/22 2249    oxyCODONE (ROXICODONE) immediate release tablet 5 mg  5 mg Oral Q4H PRN Herbert Moors, AGNP   5 mg at 03/22/22 0050    prochlorperazine (COMPAZINE) injection 10 mg  10 mg Intravenous Q6H PRN [name], MD        prochlorperazine (COMPAZINE) tablet 10 mg  10 mg Oral Q6H PRN [name], MD        sodium chloride (NS) 0.9 % flush 10 mL  10 mL Intravenous BID Herbert Moors, AGNP   10 mL at 03/21/22 2104    sodium chloride (NS) 0.9 % flush 10 mL  10 mL Intravenous BID Herbert Moors, AGNP   10 mL at 03/22/22 0940    sodium chloride (NS) 0.9 % flush 10 mL  10 mL Intravenous BID Herbert Moors, AGNP   10 mL at 03/22/22 0940    sodium chloride (NS) 0.9 % infusion  20 mL/hr Intravenous Continuous Herbert Moors, AGNP        sodium chloride (NS) 0.9 % infusion  100 mL/hr Intravenous Continuous [name], MD 100 mL/hr at 03/21/22 2321 100 mL/hr at 03/21/22 2321    sodium chloride (NS) 0.9 % infusion  20 mL/hr Intravenous Continuous PRN [name], MD        sodium chloride 0.9% (NS) bolus 1,000 mL  1,000 mL Intravenous Daily PRN [name], MD        valACYclovir (VALTREX) tablet 500 mg  500 mg Oral Daily Herbert Moors, AGNP   500 mg at 03/21/22 2059    [START ON 03/24/2022] vinCRIStine (ONCOVIN) 2 mg in sodium chloride (NS) 0.9 % 25 mL IVPB  2 mg Intravenous Once [name], MD           Physical Exam:  General: Resting, in no apparent distress, lying in bed  HEENT:  PERRL. No scleral icterus or conjunctival injection. MMM without ulceration, erythema or exudate.   Heart:  RRR. S1, S2. No murmurs, gallops, or rubs.  Lungs:  Breathing is  unlabored, and patient is speaking full sentences with ease.  No stridor.  CTAB. No rales, ronchi, or crackles.    Abdomen:  No distention or pain on palpation.  Bowel sounds are present and normoactive x 4.  No palpable hepatomegaly or splenomegaly.  No palpable masses.  Skin:  No rashes, petechiae or purpura.  No areas of skin breakdown. Warm to touch, dry, smooth, and even.  Musculoskeletal:  No grossly-evident joint effusions or deformities.  Range of motion about the shoulder, elbow, hips and knees is grossly normal.  Psychiatric:  Range of affect is appropriate.    Neurologic:  Alert and oriented to person, place, time and situation.  Gait is normal. CNII-CNXII grossly intact.  Extremities:  Appear well-perfused. No clubbing, edema, or cyanosis.  CVAD: R CW Port - no erythema, nontender; dressing CDI.      Labs:  Recent Labs     03/21/22  1409 03/22/22  0146   WBC 5.8 10.6   NEUTROABS 3.8 8.6*   LYMPHSABS 1.2 1.6   HGB 8.3* 8.5*   HCT 24.6* 25.0*   PLT 183 130*   CREATININE 0.61 0.65   BUN 13 15   BILITOT 0.7 0.8   BILIDIR  --  0.20   AST 71* 58*   ALT 104* 91*   ALKPHOS 75 74   K 3.6 4.1   MG  --  2.0   CALCIUM 9.3 9.1   NA 140 142   CL 106 111*   CO2 27.0 24.0   PHOS  --  2.9       Imaging:  None.     Irena Cords, PA-C  03/22/2022  3:15 PM   Physician Assistant   Hematology/Oncology Department   [facility]   Group Pager: [phone]

## 2022-03-22 NOTE — Unmapped (Signed)
Lower GI [facility]) Surgery Consult Note    Date: 03/22/22   Surgical Service: Lower GI [facility]) Surgery  Surgical Attending: Dr. Miachel Roux     ASSESSMENT: Patricia [name] is a 46 y.o. female with history of ***    PLAN: ***    If you have any questions, concerns or changes in the patient's clinical status, please feel free to contact {[phone]}.    History of Present Illness:   Patricia [name] is a 46 y.o. female who is seen in consultation for hemorrhoids at the request of [name], MD on the Oncology/Hematology (MDE) service.     ANDORA GOODENOW is 46 y.o. 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     Current Outpatient Medications on File Prior to Encounter   Medication Sig Dispense Refill    acetaminophen (TYLENOL 8 HOUR) 650 MG CR tablet Take 1 tablet (650 mg total) by mouth every eight (8) hours as needed for pain.      albuterol HFA 90 mcg/actuation inhaler Inhale 2 puffs every six (6) hours as needed for wheezing.      bismuth subsalicylate (PEPTO BISMOL) 262 mg/15 mL suspension Take 30 mL by mouth every six (6) hours as needed for indigestion. 237 mL 0    cetirizine (ZYRTEC) 10 MG tablet Take 1 tablet (10 mg total) by mouth nightly. 30 tablet 2    dapsone 100 MG tablet Take 1 tablet (100 mg total) by mouth daily. 30 tablet 2    dasatinib (SPRYCEL) 70 MG tablet Take 1 tablet (70 mg total) by mouth daily. 30 tablet 7    filgrastim-aafi (NIVESTYM) 480 mcg/0.8 mL Syrg injection syringe Inject the contents of one syringe (480 mcg or 0.8 mL) under the skin once daily for 7 days with each cycle of chemotherapy. Administer 24 hours after completion of chemotherapy. 5.6 mL 3    levalbuterol (XOPENEX) 0.31 mg/3 mL nebulizer solution Inhale 3 mL (0.31 mg total) by nebulization every four (4) hours as needed for wheezing (cough). 45 mL 0    NON FORMULARY PATIENT SUPPLIED MED Apply 1 application. topically Three (3) times a day as needed. Doctor Butler's Cream [Amazon]      oxyCODONE (ROXICODONE) 5 MG immediate release tablet Take 1-2 tablets (5-10 mg total) by mouth every four (4) hours as needed for pain      prochlorperazine (COMPAZINE) 10 MG tablet Take 1 tablet (10 mg total) by mouth every six (6) hours as needed for up to 14 days. 30 tablet 0    valACYclovir (VALTREX) 500 MG tablet Take 1 tablet (500 mg total) by mouth daily. 90 tablet 3       ALLERGIES:   Allergies   Allergen Reactions    Erythromycin Hives    Other      Pt cant take ibuprofen due to condition.    Sulfa (Sulfonamide Antibiotics) Anaphylaxis    Azithromycin        FAMILY  HISTORY: Reviewed. No pertinent family history.*** History reviewed. No pertinent family history.    SOCIAL HISTORY: Reviewed. No pertinent social history.***   Social History     Tobacco Use    Smoking status: Never    Smokeless tobacco: Never       Review of Systems: 10 systems were reviewed and are negative except as noted specifically in the HPI.    OBJECTIVE  Vitals:   Vitals:    03/22/22 1434   BP: 105/70   Pulse: 105   Resp: 18   Temp:    SpO2: 92%     Intake/Output last 24 hours:   Intake/Output Summary (Last 24 hours) at 03/22/2022 1646  Last data filed at 03/22/2022 1600  Gross per 24 hour   Intake 980 ml   Output --   Net 980 ml     Physical Exam:   GEN: No acute distress. ***Well-appearing consistent with stated age.   HEENT: Atraumatic. Anicteric. EOMI. Mucous membranes moist. Normal dentition.  CV: Regular rate and rhythm   PULM: Normal respiratory effort. Satting well on {[id number]}  GI: Soft, appropriately tender to palpation, nondistended without needed. Doctor Butler's Cream [Amazon]      oxyCODONE (ROXICODONE) 5 MG immediate release tablet Take 1-2 tablets (5-10 mg total) by mouth every four (4) hours as needed for pain      prochlorperazine (COMPAZINE) 10 MG tablet Take 1 tablet (10 mg total) by mouth every six (6) hours as needed for up to 14 days. 30 tablet 0    valACYclovir (VALTREX) 500 MG tablet Take 1 tablet (500 mg total) by mouth daily. 90 tablet 3       ALLERGIES:   Allergies   Allergen Reactions    Erythromycin Hives    Other      Pt cant take ibuprofen due to condition.    Sulfa (Sulfonamide Antibiotics) Anaphylaxis    Azithromycin        FAMILY HISTORY: denies family history of colorectal cancer    SOCIAL HISTORY: Reviewed.    Social History     Tobacco Use    Smoking status: Never    Smokeless tobacco: Never       Review of Systems: 10 systems were reviewed and are negative except as noted specifically in the HPI.    OBJECTIVE  Vitals:   Vitals:    03/22/22 1434   BP: 105/70   Pulse: 105   Resp: 18   Temp:    SpO2: 92%     Intake/Output last 24 hours:   Intake/Output Summary (Last 24 hours) at 03/22/2022 1646  Last data filed at 03/22/2022 1600  Gross per 24 hour   Intake 980 ml   Output --   Net 980 ml     Physical Exam:   GEN: No acute distress. Well-appearing consistent with stated age.   HEENT: Atraumatic. Anicteric. EOMI. Mucous membranes moist. Normal dentition.  CV: Regular rate and rhythm   PULM: Normal respiratory effort. Satting well on room air  GI: non tender  Anorectal: large anterior midline skin tag, mildly tender. Normal sphincter tone. No palpable internal masses or lesions. No stool or blood.  MSK: Moves all 4 extremities spontaneously. Full range of motion.   SKIN: Warm. Well perfused without cyanosis or edema.   NEURO: Alert and oriented x3. No gross motor or sensory deficit.    Pertinent Diagnostic Tests:  All lab results last 24 hours:    Recent Results (from the past  24 hour(s))   ECG 12 Lead    Collection Time: 03/22/22 10:30 AM   Result Value Ref Range    EKG Systolic BP  mmHg    EKG Diastolic BP  mmHg    EKG Ventricular Rate 98 BPM    EKG Atrial Rate 98 BPM    EKG P-R Interval 168 ms    EKG QRS Duration 68 ms    EKG Q-T Interval 404 ms    EKG QTC Calculation 515 ms    EKG Calculated P Axis 37 degrees    EKG Calculated R Axis 25 degrees    EKG Calculated T Axis 40 degrees    QTC Fredericia 476 ms   CSF protein    Collection Time: 03/22/22  2:17 PM   Result Value Ref Range    Protein, CSF 27 20 - 59 mg/dL   Glucose, CSF    Collection Time: 03/22/22  2:17 PM   Result Value Ref Range    Glucose, CSF 118 (H) 48 - 79 mg/dL   Basic Metabolic Panel    Collection Time: 03/23/22  1:25 AM   Result Value Ref Range    Sodium 145 135 - 145 mmol/L    Potassium 4.0 3.4 - 4.8 mmol/L    Chloride 112 (H) 98 - 107 mmol/L    CO2 24.0 20.0 - 31.0 mmol/L    Anion Gap 9 5 - 14 mmol/L    BUN 19 9 - 23 mg/dL    Creatinine 1.61 0.96 - 0.80 mg/dL    BUN/Creatinine Ratio 30     eGFR CKD-EPI (2021) Female >90 >=60 mL/min/1.99m2    Glucose 157 70 - 179 mg/dL    Calcium 8.7 8.7 - 04.5 mg/dL   Magnesium Level    Collection Time: 03/23/22  1:25 AM   Result Value Ref Range    Magnesium 2.0 1.6 - 2.6 mg/dL   CBC w/ Differential    Collection Time: 03/23/22  1:25 AM   Result Value Ref Range    WBC 7.0 3.6 - 11.2 10*9/L    RBC 1.99 (L) 3.95 - 5.13 10*12/L    HGB 6.8 (L) 11.3 - 14.9 g/dL    HCT 40.9 (L) 81.1 - 44.0 %    MCV 101.9 (H) 77.6 - 95.7 fL    MCH 34.3 (H) 25.9 - 32.4 pg    MCHC 33.6 32.0 - 36.0 g/dL    RDW 91.4 (H) 78.2 - 15.2 %    MPV 7.6 6.8 - 10.7 fL    Platelet 136 (L) 150 - 450 10*9/L    Neutrophils % 79.4 %    Lymphocytes % 13.3 %    Monocytes % 6.9 %    Eosinophils % 0.0 %    Basophils % 0.4 %    Absolute Neutrophils 5.6 1.8 - 7.8 10*9/L    Absolute Lymphocytes 0.9 (L) 1.1 - 3.6 10*9/L    Absolute Monocytes 0.5 0.3 - 0.8 10*9/L    Absolute Eosinophils 0.0 0.0 - 0.5 10*9/L    Absolute Basophils 0.0 0.0 - 0.1 10*9/L    Macrocytosis Slight (A) Not Present Anisocytosis Marked (A) Not Present   Prepare RBC    Collection Time: 03/23/22  5:41 AM   Result Value Ref Range    Crossmatch Compatible     Unit Blood Type O Pos     ISBT Number 5100     Unit # [id number]     Status Issued     Spec  Expiration [id number]     Product ID Red Blood Cells     PRODUCT CODE [id number]        Imaging:  ECG 12 Lead    Result Date: 03/22/2022  NORMAL SINUS RHYTHM LOW VOLTAGE QRS PROLONGED QT ABNORMAL ECG WHEN COMPARED WITH ECG OF 25-Feb-2022 19:28, NO SIGNIFICANT CHANGE WAS FOUND

## 2022-03-22 NOTE — Unmapped (Signed)
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 Tolerated well. Pt had shoulder pain that required 5mg  of oxy. Pt was able to rest some after the oxy was on board and chemo had finished. Pt is free from falls during shift. WCTM.      Problem: Adult Inpatient Plan of Care  Goal: Plan of Care Review  Outcome: Ongoing - Unchanged  Goal: Patient-Specific Goal (Individualized)  Outcome: Ongoing - Unchanged  Goal: Absence of Hospital-Acquired Illness or Injury  Outcome: Ongoing - Unchanged  Intervention: Identify and Manage Fall Risk  Recent Flowsheet Documentation  Taken 03/21/2022 2200 by Artemio Aly, RN  Safety Interventions:   chemotherapeutic agent precautions   lighting adjusted for tasks/safety   low bed   fall reduction program maintained  Intervention: Prevent Skin Injury  Recent Flowsheet Documentation  Taken 03/21/2022 2100 by Artemio Aly, RN  Skin Protection: adhesive use limited  Intervention: Prevent and Manage VTE (Venous Thromboembolism) Risk  Recent Flowsheet Documentation  Taken 03/21/2022 2200 by Artemio Aly, RN  Activity Management:   activity encouraged   activity adjusted per tolerance  Intervention: Prevent Infection  Recent Flowsheet Documentation  Taken 03/21/2022 2200 by Artemio Aly, RN  Infection Prevention:   cohorting utilized   environmental surveillance performed   rest/sleep promoted  Goal: Optimal Comfort and Wellbeing  Outcome: Ongoing - Unchanged  Goal: Readiness for Transition of Care  Outcome: Ongoing - Unchanged  Intervention: Mutually Develop Transition Plan  Recent Flowsheet Documentation  Taken 03/21/2022 2227 by Artemio Aly, RN  Transportation Anticipated: family or friend will provide  Goal: Rounds/Family Conference  Outcome: Ongoing - Unchanged

## 2022-03-22 NOTE — Unmapped (Signed)
OCCUPATIONAL THERAPY  Evaluation (03/22/22 0904)    Patient Name:  Patricia [name]       Medical Record Number: [id number]   Date of Birth: [birth year]  Sex: Female            OT Treatment Diagnosis:  Decreased activity tolerance impacting participation in ADL/IADL routines    Assessment  Problem List: Decreased endurance    Assessment of Occupational Performance : Endurance    Clinical Decision Making: Low Complexity    Assessment: KAILER MACLIN is an 46 y.o. female who was admitted for C5 A of HyperCVAD + dasatinib. Ms. Sausedo presents to skilled acute OT with decreased activity tolerance and tachycardia impacting participation in ADL/IADL routines. She is independent at functional baseline but has experienced deconditioning since diagnosis/starting treatment. She was recently discharged from lengthy admission and noted increased fatigue/tachycardia, requiring more frequent rest breaks during activity. Her tachycardia was aslo limiting last admission, and her HR was up to 147 following bathroom-level toileting routine during today's eval. She was receptive to education regarding monitoring vitals and energy conservation. She will benefit from skilled acute OT to maximize functional safety and independence with ADL/IADL routines. No post-acute OT recommended at this time. Based on review of comorbidities, personal factors, and clinical presentation of exam findings, pt demonstrates a low complexity case for evaluation and treatment.    Today's Interventions: Other  Today's Interventions: Pt completed and assisted with bed mobility, sitting balance/tolerance, functional transfers, standing balance/tolerance, functional mobility, toileting, and grooming. Pt provided education re: role of skilled acute OT, importance of EOB/OOB activity, vitals monitoring, activity progression, energy conservation, and OT POC.    Activity Tolerance During Today's Session  Limited secondary to medical complications (tachycardia)    Plan  Planned Frequency of Treatment:  1-2x per day for: 2-3x week  Planned Treatment Duration: 04/05/22    Planned Interventions:  Adaptive equipment, ADL retraining, Balance activities, Bed mobility, Compensatory tech. training, Conservation, Functional cognition, Environmental support, Endurance activities, Education - Family / caregiver, Education - Patient, Functional mobility, Home exercise program, Safety education, Therapeutic exercise, Transfer training, UE Strength / coordination exercise, Therapist provided opportunity for spontaneous movement    Post-Discharge Occupational Therapy Recommendations:   Skilled OT services NOT indicated   OT DME Recommendations: None -      GOALS:   Patient and Family Goals: To progress her tolerance for activity    IP Long Term Goal #1: Pt will score 24/24 on the AMPAC within 4 weeks     Short Term:  SHORT GOAL #1: Pt will complete 10 min OOB ADL/IADL routines with mod I   Time Frame : 2 weeks  SHORT GOAL #2: Pt will verbalize 3 energy conservation strategies to utilize during ADL/IADL routines   Time Frame : 2 weeks    Prognosis:  Good  Positive Indicators:  PLOF  Barriers to Discharge: None    Subjective  Current Status Pt received/left semi-reclined in bed with call bell in reach, all immediate needs met, and RN aware  Prior Functional Status Pt reports independence at functional baseline. She has experienced deconditioning since diagnosis/strating treatment, and she now has a shower chair to support bathing routines. Pt has recently been more limited by tachycardia and requires frequent rest breaks. 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 Last week, following discharge from recent admission, she went into the classroom to assist the long-term sub  with classroom set up, but she noted more fatigue/weakness.    Medical Tests / Procedures: Reviewed       Patient / Caregiver reports: I can tell I've gotten weaker.    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 No pertinent family history.     Erythromycin, Other, Sulfa (sulfonamide antibiotics), and Azithromycin     Objective Findings  Precautions / Restrictions  Chemo precautions       Weight Bearing  Non-applicable    Required Braces or Orthoses  Non-applicable    Communication Preference  Verbal    Pain  Pt denied pain    Equipment / Environment  Vascular access (PIV, TLC, Port-a-cath, PICC)    Living Situation  Living Environment: House  Lives With: Spouse  Home Living: One level home, Stairs to enter without rails, Walk-in shower, Shower chair without back  Number of Stairs to [employer] (outside): 1  Equipment available at home: Paediatric nurse without back     Cognition   Orientation Level:  Oriented x 4   Arousal/Alertness:  Appropriate responses to stimuli   Attention Span:  Appears intact   Memory:  Appears intact   Following Commands:  Follows all commands and directions without difficulty   Safety Judgment:  Good awareness of safety precautions   Awareness of Errors:  Good awareness of errors made   Problem Solving:  Able to problem solve independently   Comments:      Vision / Hearing   Vision: No deficits identified     Hearing: No deficit identified       Hand Function:  Right Hand Function: Right hand grip strength, ROM and coordination WNL  Left Hand Function: Left hand grip strength, ROM and coordination WNL  Hand Dominance: Right    Skin Inspection:  Skin Inspection: Intact where visualized    ROM / Strength:  UE ROM/Strength: Left WFL, Right WFL  LE ROM/Strength: Left WFL, Right WFL    Coordination:  Coordination: WFL    Sensation:  RUE Sensation: RUE intact  LUE Sensation: LUE intact  RLE Sensation: RLE intact  LLE Sensation: LLE intact    Balance:  Static Sitting-Level of Assistance: Independent  Dynamic Sitting-Level of Assistance: Archivist Standing-Level of Assistance: Independent  Dynamic Standing - Level of Assistance: Independent    Functional Mobility  Transfer Assistance Needed: No (pt completed sit<>stand EOB and toilet t/f with independence)  Bed Mobility Assistance Needed: No (pt completed sup<>sit with independence)  Ambulation: Pt completed functional mobility at room-level to access bathroom with mod I + IV pole    ADLs  ADLs: Modified Independent, Independent, Supervision (given pt's current level of functional performance, anticipate independence with most ADLs, supervision for safety during seated bathing)    Vitals / Orthostatics  Vitals/Orthostatics: HR: 119 sitting EOB following sup>sit, 147 standing outside bathroom following toileting/hand hygiene, resolved to 120 after ~1 min rest sitting EOB following bathroom  routine    Medical Staff Made Aware: RN    Occupational Therapy Session Duration  OT Individual [mins]: 10  OT Co-Treatment [mins]: 35 (w/ Sandria Senter, PT)  Reason for Co-treatment: Poor activity tolerance       I attest that I have reviewed the above information.  Signed: Ancil Linsey, OT  Ceasar Mons 03/22/2022

## 2022-03-23 LAB — CBC W/ AUTO DIFF
BASOPHILS ABSOLUTE COUNT: 0 10*9/L (ref 0.0–0.1)
BASOPHILS RELATIVE PERCENT: 0.4 %
EOSINOPHILS ABSOLUTE COUNT: 0 10*9/L (ref 0.0–0.5)
EOSINOPHILS RELATIVE PERCENT: 0 %
HEMATOCRIT: 20.3 % — ABNORMAL LOW (ref 34.0–44.0)
HEMOGLOBIN: 6.8 g/dL — ABNORMAL LOW (ref 11.3–14.9)
LYMPHOCYTES ABSOLUTE COUNT: 0.9 10*9/L — ABNORMAL LOW (ref 1.1–3.6)
LYMPHOCYTES RELATIVE PERCENT: 13.3 %
MEAN CORPUSCULAR HEMOGLOBIN CONC: 33.6 g/dL (ref 32.0–36.0)
MEAN CORPUSCULAR HEMOGLOBIN: 34.3 pg — ABNORMAL HIGH (ref 25.9–32.4)
MEAN CORPUSCULAR VOLUME: 101.9 fL — ABNORMAL HIGH (ref 77.6–95.7)
MEAN PLATELET VOLUME: 7.6 fL (ref 6.8–10.7)
MONOCYTES ABSOLUTE COUNT: 0.5 10*9/L (ref 0.3–0.8)
MONOCYTES RELATIVE PERCENT: 6.9 %
NEUTROPHILS ABSOLUTE COUNT: 5.6 10*9/L (ref 1.8–7.8)
NEUTROPHILS RELATIVE PERCENT: 79.4 %
PLATELET COUNT: 136 10*9/L — ABNORMAL LOW (ref 150–450)
RED BLOOD CELL COUNT: 1.99 10*12/L — ABNORMAL LOW (ref 3.95–5.13)
RED CELL DISTRIBUTION WIDTH: 25.6 % — ABNORMAL HIGH (ref 12.2–15.2)
WBC ADJUSTED: 7 10*9/L (ref 3.6–11.2)

## 2022-03-23 LAB — URINALYSIS WITH MICROSCOPY
BACTERIA: NONE SEEN /HPF
BILIRUBIN UA: NEGATIVE
BLOOD UA: NEGATIVE
GLUCOSE UA: NEGATIVE
KETONES UA: 60 — AB
LEUKOCYTE ESTERASE UA: NEGATIVE
NITRITE UA: NEGATIVE
PH UA: 6.5 (ref 5.0–9.0)
PROTEIN UA: NEGATIVE
RBC UA: 1 /HPF (ref <=4)
RENAL TUBULAR EPITHELIAL CELLS: <1 /HPF — ABNORMAL HIGH (ref <=0)
SPECIFIC GRAVITY UA: 1.016 (ref 1.003–1.030)
SQUAMOUS EPITHELIAL: <1 /HPF (ref 0–5)
UROBILINOGEN UA: <2
WBC UA: 4 /HPF (ref 0–5)

## 2022-03-23 LAB — BASIC METABOLIC PANEL
ANION GAP: 9 mmol/L (ref 5–14)
BLOOD UREA NITROGEN: 19 mg/dL (ref 9–23)
BUN / CREAT RATIO: 30
CALCIUM: 8.7 mg/dL (ref 8.7–10.4)
CHLORIDE: 112 mmol/L — ABNORMAL HIGH (ref 98–107)
CO2: 24 mmol/L (ref 20.0–31.0)
CREATININE: 0.64 mg/dL
EGFR CKD-EPI (2021) FEMALE: >90 mL/min/{1.73_m2} (ref >=60)
GLUCOSE RANDOM: 157 mg/dL (ref 70–179)
POTASSIUM: 4 mmol/L (ref 3.4–4.8)
SODIUM: 145 mmol/L (ref 135–145)

## 2022-03-23 LAB — HEMATOPATHOLOGY LEUKEMIA/LYMPHOMA FLOW CYTOMETRY, CSF
LYMPHS CSF: 69.2 %
MONO/MACROPHAGE CSF: 30.8 %
NUCLEATED CELLS, CSF: 1 ul (ref 0–5)
NUMBER OF CELLS CSF: 26
RBC CSF: 15 ul — ABNORMAL HIGH

## 2022-03-23 LAB — MAGNESIUM: MAGNESIUM: 2 mg/dL (ref 1.6–2.6)

## 2022-03-23 MED ADMIN — dapsone tablet 100 mg: 100 mg | ORAL | @ 13:00:00

## 2022-03-23 MED ADMIN — sodium chloride (NS) 0.9 % flush 10 mL: 10 mL | INTRAVENOUS | @ 02:00:00

## 2022-03-23 MED ADMIN — sodium chloride (NS) 0.9 % flush 10 mL: 10 mL | INTRAVENOUS | @ 13:00:00

## 2022-03-23 MED ADMIN — dexAMETHasone (DECADRON) tablet 40 mg: 40 mg | ORAL | @ 02:00:00 | Stop: 2022-03-25

## 2022-03-23 MED ADMIN — ondansetron (ZOFRAN) tablet 24 mg: 24 mg | ORAL | @ 02:00:00 | Stop: 2022-03-25

## 2022-03-23 MED ADMIN — OLANZapine (ZyPREXA) tablet 10 mg: 10 mg | ORAL | @ 02:00:00 | Stop: 2022-03-25

## 2022-03-23 MED ADMIN — cycloPHOSphamide (CYTOXAN) 585 mg in sodium chloride (NS) 0.9 % 250 mL IVPB: 300 mg/m2 | INTRAVENOUS | @ 03:00:00 | Stop: 2022-03-24

## 2022-03-23 MED ADMIN — cycloPHOSphamide (CYTOXAN) 585 mg in sodium chloride (NS) 0.9 % 250 mL IVPB: 300 mg/m2 | INTRAVENOUS | @ 15:00:00 | Stop: 2022-03-24

## 2022-03-23 MED ADMIN — mesna (MESNEX) 1,170 mg in sodium chloride (NS) 0.9 % 1,000 mL IVPB: 600 mg/m2 | INTRAVENOUS | @ 03:00:00 | Stop: 2022-03-24

## 2022-03-23 MED ADMIN — valACYclovir (VALTREX) tablet 500 mg: 500 mg | ORAL | @ 22:00:00

## 2022-03-23 MED ADMIN — dasatinib (SPRYCEL) tablet 70 mg: 70 mg | ORAL | @ 22:00:00

## 2022-03-23 MED ADMIN — cetirizine (ZyrTEC) tablet 10 mg: 10 mg | ORAL | @ 13:00:00

## 2022-03-23 NOTE — Unmapped (Signed)
Daily Progress Note      Active Problems:    * No active hospital problems. *       LOS: 2 days     Assessment/Plan:Patricia [name] is an 46 y.o. female who was admitted for C5 A of HyperCVAD + dasatinib.    Plan Summary: D3=8/23 of HyperCVAD+Dasatinib. LP/IT chemo in [place] on 8/23, Hempath pending. New vaginal lesion, HSV swab obtained. 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 GI surgery consulted for hemorrhoid/anal fissure management. Asymptomatic and intermittent tachycardia, CTM. Neupogen x 7 days on discharge.      Ph+ ALL, in remission: Initially diagnosed in 09/2021 and underwent [id number] induction complicated by PICC line associated superficial thrombus. BMBx on 5/2 revealed morphologic CR. C4 was complicated by admission for neutropenic fever with unclear source, though respiratory origin suspected given subtle LLL centrilobular groundglass opacities and patient presented with worsening cough. Discharged 7/28 after resolution of fevers off cefepime. Admitted for Cycle 5A HyperCVAD + Dasatinib. Patient has completed 7 IT chemo treatments prior to admission, with plan to complete one with each cycle d/t patient intolerance of 2/cycle.  - C5D1 HyperCVAD (A cycle) = 03/21/22   - Dasatinib 70 mg daily   - ppx Dapsone and Valtrex   - IT MTX on 8/22 in [place] - pending      Regimen: R-HyperCVAD  Cycle # 5  Primary Oncologist: Dr. Senaida Ores     Odd Cycles  Date 8/21           Day 1 2 3 4 5    Doxorubicin 50 mg/m2       x     Vincristine 2 mg       x     Cyclophosphamide 300 mg/m2 Q12H X  x X  x X  x       Dexamethasone 40mg  x x x x     Methotrexate IT 12mg    x            - Daily urinalysis, monitor for hemorraghic cystitis  - Monitor for PN and constipation      Disposition:  all needs to be requested.  - Neupogen self injection x 7 days   - Followup lab tests   - Follow up with Dr. Senaida Ores   - D11-14 Dex, RX on dishcarge   - D11 Vincristine appointment needs to be requested     Migraines: Patient with longstanding history of recurrent unilateral head, neck, sinus pain in addition to postnasal drip and rhinorrhea.  Severe but dull pain that lasts for days, sensitive to light and sound.  Patient has received several courses of Augmentin with concern that this represented sinus infections; however CT sinus performed 7/20 revealed only minimal mucosal thickening of the right maxillary and right sphenoid sinuses.  ENT consult note from recent admission noted no signs of active infection.  Seen by neurology during recent admission who believed this was more consistent with migraine headaches.  They recommended conservative management and outpatient follow-up in neurology clinic.  - Magnesium, riboflavin supplementation  - Heating pads, neck stretches  [ ]  Ensure patient has neurology outpatient follow-up on discharge     Hemorrhoids - Anal fissure: Patient continues to have significant pain from hemorrhoids.  She has trialed Sitz baths, lidocaine-based topical creams, calcium channel blocker creams, hydrocortisone cream without improvement.  Ambulatory GI referral placed on discharge from last hospitalization, however patient has not seen them yet .  - GI  surgery consult      Grade 1 peripheral neuropathy: Paraesthesias present primarily in her finger pads, currently without medical interventions continue to monitor while on therapy. Not affecting ADLs.     Anxiety: Reports taking Lexapro 10 mg at home, recently restarted.  - Continue Lexapro 10mg  daily     Cancer related fatigue:  Patient endorses fatigue with onset of cancer symptoms or treatment.  Symptoms started  with diagnosis weeks ago.  Primary symptoms are activity intolerance and fatigue     Immunocompromised status: Patient is immunocompromised secondary to ALL disease or chemotherapy  -Antimicrobial prophylaxis as above     Impending Electrolyte Abnormality Secondary to Chemotherapy and/or IV Fluids  -Daily Electrolyte monitoring  -Replete per [facility] guidelines.      Impending Pancytopenia secondary to Acute Leukemia/chemotherapy:   - Transfuse 1 unit of pRBCs for hgb >7  - Transfuse 1 unit plt for plts >10K       Nutrition:                        Subjective:   Afebrile, [name]. Reports a raised lesion on her vaginal. 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 She otherwise has no complaints.      10 point ROS otherwise negative except as above in the HPI.     Objective:     Vital signs in last 24 hours:  Temp:  [36.7 ??C (98.1 ??F)-37.1 ??C (98.8 ??F)] 36.9 ??C (98.5 ??F)  Heart Rate:  [89-114] 97  Resp:  [16-18] 16  BP: (105-124)/(61-84) 118/84  MAP (mmHg):  [88-95] 95  SpO2:  [91 %-96 %] 92 %    Intake/Output last 3 shifts:  I/O last 3 completed shifts:  In: 4191.4 [P.O.:1600; I.V.:180; IV Piggyback:2411.4]  Out: -     Meds:  Current Facility-Administered Medications   Medication Dose Route Frequency Provider Last Rate Last Admin    cetirizine (ZyrTEC) tablet 10 mg  10 mg Oral Nightly Herbert Moors, AGNP   10 mg at 03/23/22 0920    CHEMO CLARIFICATION ORDER   Other Continuous PRN Maryanna Shape, PA        CHEMO CLARIFICATION ORDER   Other Continuous PRN Maryanna Shape, PA        cycloPHOSphamide (CYTOXAN) 585 mg in sodium chloride (NS) 0.9 % 250 mL IVPB  300 mg/m2 (Treatment Plan Recorded) Intravenous Q12H [name], MD 152.1 mL/hr at 03/23/22 1126 585 mg at 03/23/22 1126    dapsone tablet 100 mg  100 mg Oral Daily Herbert Moors, AGNP   100 mg at 03/23/22 0920    dasatinib (SPRYCEL) tablet 70 mg  70 mg Oral Daily [name], MD   70 mg at 03/22/22 1851    dexAMETHasone (DECADRON) tablet 40 mg  40 mg Oral Q24H [name], MD   40 mg at 03/22/22 2219    diphenhydrAMINE (BENADRYL) injection 25 mg  25 mg Intravenous Q4H PRN [name], MD        Melene Muller ON 03/24/2022] DOXOrubicin (ADRIAMYCIN) 97.5 mg in sodium chloride (NS) 0.9 % 1,000 mL IVPB  50 mg/m2 (Treatment Plan Recorded) Intravenous Once [name], MD        Dr Silvana Newness Hemorrhoid & Fissure Ointment *Patient Home supplied*  1 application. Topical TID PRN Herbert Moors, AGNP        emollient combination no.92 (LUBRIDERM) lotion 1 application.  1 application. Topical Q1H PRN Herbert Moors, AGNP        EPINEPHrine [facility]) injection 0.3 mg  0.3 mg Intramuscular Daily PRN [name], MD        famotidine (PF) (PEPCID) injection 20 mg  20 mg Intravenous Q4H PRN [name], MD        IP OKAY TO TREAT   Other Continuous PRN Herbert Moors, AGNP        loperamide (IMODIUM) capsule 2 mg  2 mg Oral Q2H PRN Herbert Moors, AGNP        loperamide (IMODIUM) capsule 4 mg  4 mg Oral Once PRN Herbert Moors, AGNP        meperidine (DEMEROL) injection 25 mg  25 mg Intravenous Q30 Min PRN [name], MD        mesna (MESNEX) 1,170 mg in sodium chloride (NS) 0.9 % 1,000 mL IVPB  600 mg/m2 (Treatment Plan Recorded) Intravenous Q24H [name], MD 44.2 mL/hr at 03/22/22 2315 1,170 mg at 03/22/22 2315    methylPREDNISolone sodium succinate (PF) (SOLU-medrol) injection 125 mg  125 mg Intravenous Q4H PRN [name], MD        OLANZapine (ZyPREXA) tablet 10 mg  10 mg Oral Q24H [name], MD   10 mg at 03/22/22 2219    ondansetron (ZOFRAN) tablet 24 mg  24 mg Oral Q24H [name], MD   24 mg at 03/22/22 2219    oxyCODONE (ROXICODONE) immediate release tablet 5 mg  5 mg Oral Q4H PRN Herbert Moors, AGNP   5 mg at 03/22/22 0050    prochlorperazine (COMPAZINE) injection 10 mg  10 mg Intravenous Q6H PRN [name], MD        prochlorperazine (COMPAZINE) tablet 10 mg  10 mg Oral Q6H PRN [name], MD        sodium chloride (NS) 0.9 % flush 10 mL  10 mL Intravenous BID Herbert Moors, AGNP   10 mL at 03/21/22 2104    sodium chloride (NS) 0.9 % flush 10 mL  10 mL Intravenous BID Herbert Moors, AGNP   10 mL at 03/23/22 0920    sodium chloride (NS) 0.9 % infusion  20 mL/hr Intravenous Continuous Herbert Moors, AGNP        sodium chloride (NS) 0.9 % infusion  100 mL/hr Intravenous Continuous [name], MD 100 mL/hr at 03/21/22 2321 100 mL/hr at 03/21/22 2321    sodium chloride (NS) 0.9 % infusion  20 mL/hr Intravenous Continuous PRN [name], MD        sodium chloride 0.9% (NS) bolus 1,000 mL  1,000 mL Intravenous Daily PRN [name], MD        valACYclovir (VALTREX) tablet 500 mg  500 mg Oral Daily Herbert Moors, AGNP   500 mg at 03/22/22 1852    [START ON 03/24/2022] vinCRIStine (ONCOVIN) 2 mg in sodium chloride (NS) 0.9 % 25 mL IVPB  2 mg Intravenous Once [name], MD           Physical Exam:  General: Resting, in no apparent distress, lying in bed  HEENT:  PERRL. No scleral icterus or conjunctival injection. MMM without ulceration, erythema or exudate.   Heart:  RRR. S1, S2. No murmurs, gallops, or rubs.  Lungs:  Breathing is unlabored, and patient is speaking full sentences with ease.  No stridor.  CTAB. No  rales, ronchi, or crackles.    Abdomen:  No distention or pain on palpation.  Bowel sounds are present and normoactive x 4.  No palpable hepatomegaly or splenomegaly.  No palpable masses.  Skin:  No rashes, petechiae or purpura.  No areas of skin breakdown. Warm to touch, dry, smooth, and even.  Musculoskeletal:  No grossly-evident joint effusions or deformities.  Range of motion about the shoulder, elbow, hips and knees is grossly normal.  Psychiatric:  Range of affect is appropriate.    Neurologic:  Alert and oriented to person, place, time and situation.  Gait is normal. CNII-CNXII grossly intact.  Extremities:  Appear well-perfused. No clubbing, edema, or cyanosis.  CVAD: R CW Port - no erythema, nontender; dressing CDI.      Labs:  Recent Labs     03/21/22  1409 03/22/22  0146 03/23/22  0125   WBC 5.8 10.6 7.0   NEUTROABS 3.8 8.6* 5.6   LYMPHSABS 1.2 1.6 0.9*   HGB 8.3* 8.5* 6.8*   HCT 24.6* 25.0* 20.3*   PLT 183 130* 136*   CREATININE 0.61 0.65 0.64   BUN 13 15 19    BILITOT 0.7 0.8  --    BILIDIR  --  0.20  --    AST 71* 58*  --    ALT 104* 91*  --    ALKPHOS 75 74  --    K 3.6 4.1 4.0   MG  --  2.0 2.0   CALCIUM 9.3 9.1 8.7   NA 140 142 145   CL 106 111* 112*   CO2 27.0 24.0 24.0   PHOS  --  2.9  --          Imaging:  None.     Irena Cords, PA-C  03/23/2022  12:50 PM   Physician Assistant   Hematology/Oncology Department   [facility]   Group Pager: [phone]

## 2022-03-23 NOTE — Unmapped (Signed)
Problem: Anemia (Chemotherapy Effects)  Goal: Anemia Symptom Improvement  Outcome: Ongoing - Unchanged  Intervention: Monitor and Manage Anemia  Flowsheets (Taken 03/23/2022 0626)  Oral Nutrition Promotion: calorie-dense foods provided  Safety Interventions:   chemotherapeutic agent precautions   fall reduction program maintained   lighting adjusted for tasks/safety   low bed   nonskid shoes/slippers when out of bed  Fatigue Management: activity schedule adjusted  Note: Pt is inpatient for C5 of HyperCVAD, VSS, afebrile, on room air, lungs clear and diminished, pt up ad lib to bathroom voiding, has hx of hemorrhoids and rectal fissure and pt states she does have some blood in bowel movements, hgb today 6.8, transfusing one unit of irradiated/leukocyte reduced pRBCs, pt tolerated cytoxan overnight, mesna infusing continuously, Right double hub port dsg changed with no ss of infection, lumens patent with blood return, safety maintained, call bell in reach, rectal exam performed by colorectal physician with this Clinical research associate (primary nurse) at bedside , see labs in [facility] and flowsheets for data.      Problem: Adult Inpatient Plan of Care  Goal: Plan of Care Review  Outcome: Ongoing - Unchanged  Goal: Absence of Hospital-Acquired Illness or Injury  Intervention: Identify and Manage Fall Risk  Recent Flowsheet Documentation  Taken 03/23/2022 0626 by Juanita Craver, RN  Safety Interventions:   chemotherapeutic agent precautions   fall reduction program maintained   lighting adjusted for tasks/safety   low bed   nonskid shoes/slippers when out of bed  Intervention: Prevent Skin Injury  Recent Flowsheet Documentation  Taken 03/22/2022 2211 by Juanita Craver, RN  Skin Protection: protective footwear used  Goal: Optimal Comfort and Wellbeing  Intervention: Monitor Pain and Promote Comfort  Recent Flowsheet Documentation  Taken 03/23/2022 5621 by Juanita Craver, RN  Pain Management Interventions: care clustered     Problem: Urinary Bleeding Risk or Actual (Chemotherapy Effects)  Goal: Absence of Hematuria  Outcome: Ongoing - Unchanged  Intervention: Prevent or Manage Hemorrhagic Cystitis  Flowsheets (Taken 03/23/2022 0626)  Pain Management Interventions: care clustered  Hyperhydration Management:   fluids provided   encouraged to void  Note: UA sent daily, today's pending pt up to bathroom ad lib.      Problem: Nausea and Vomiting (Chemotherapy Effects)  Goal: Fluid and Electrolyte Balance  Outcome: Ongoing - Unchanged  Intervention: Prevent and Manage Nausea and Vomiting  Flowsheets (Taken 03/23/2022 0626)  Environmental Support: calm environment promoted  Oral Care:   teeth brushed   tongue brushed   lip/mouth moisturizer applied  Note: Regimen followed, antiemetics given as ordered, pt tolerating PO intake well, no complaints offered. (See EMAR and treatment plan in EPIC)

## 2022-03-23 NOTE — Unmapped (Signed)
Pt. A/O, ST, other VSS, afebrile, denies pain, C5D2 of HyperCVAD, tolerated chemo well, No acute event during the shift, WCTM.     Problem: Adult Inpatient Plan of Care  Goal: Plan of Care Review  Outcome: Ongoing - Unchanged  Goal: Patient-Specific Goal (Individualized)  Outcome: Ongoing - Unchanged  Goal: Absence of Hospital-Acquired Illness or Injury  Outcome: Ongoing - Unchanged  Intervention: Identify and Manage Fall Risk  Recent Flowsheet Documentation  Taken 03/22/2022 1600 by Clayton Bibles, RN  Safety Interventions: chemotherapeutic agent precautions  Taken 03/22/2022 1200 by Clayton Bibles, RN  Safety Interventions:   chemotherapeutic agent precautions   lighting adjusted for tasks/safety   low bed  Taken 03/22/2022 1000 by Clayton Bibles, RN  Safety Interventions:   chemotherapeutic agent precautions   low bed  Taken 03/22/2022 0800 by Clayton Bibles, RN  Safety Interventions:   chemotherapeutic agent precautions   low bed  Intervention: Prevent Skin Injury  Recent Flowsheet Documentation  Taken 03/22/2022 0800 by Clayton Bibles, RN  Skin Protection: adhesive use limited  Intervention: Prevent and Manage VTE (Venous Thromboembolism) Risk  Recent Flowsheet Documentation  Taken 03/22/2022 0800 by Clayton Bibles, RN  Activity Management: activity adjusted per tolerance  Intervention: Prevent Infection  Recent Flowsheet Documentation  Taken 03/22/2022 1600 by Clayton Bibles, RN  Infection Prevention:   cohorting utilized   hand hygiene promoted  Taken 03/22/2022 1200 by Clayton Bibles, RN  Infection Prevention:   cohorting utilized   hand hygiene promoted  Taken 03/22/2022 1000 by Clayton Bibles, RN  Infection Prevention:   cohorting utilized   hand hygiene promoted  Taken 03/22/2022 0800 by Clayton Bibles, RN  Infection Prevention:   cohorting utilized   hand hygiene promoted  Goal: Optimal Comfort and Wellbeing  Outcome: Ongoing - Unchanged  Goal: Readiness for Transition of Care  Outcome: Ongoing - Unchanged  Goal: Rounds/Family Conference  Outcome: Ongoing - Unchanged

## 2022-03-24 LAB — CBC W/ AUTO DIFF
BASOPHILS ABSOLUTE COUNT: 0 10*9/L (ref 0.0–0.1)
BASOPHILS RELATIVE PERCENT: 0.1 %
EOSINOPHILS ABSOLUTE COUNT: 0 10*9/L (ref 0.0–0.5)
EOSINOPHILS RELATIVE PERCENT: 0 %
HEMATOCRIT: 22.6 % — ABNORMAL LOW (ref 34.0–44.0)
HEMOGLOBIN: 7.6 g/dL — ABNORMAL LOW (ref 11.3–14.9)
LYMPHOCYTES ABSOLUTE COUNT: 0.4 10*9/L — ABNORMAL LOW (ref 1.1–3.6)
LYMPHOCYTES RELATIVE PERCENT: 6 %
MEAN CORPUSCULAR HEMOGLOBIN CONC: 33.6 g/dL (ref 32.0–36.0)
MEAN CORPUSCULAR HEMOGLOBIN: 32.8 pg — ABNORMAL HIGH (ref 25.9–32.4)
MEAN CORPUSCULAR VOLUME: 97.6 fL — ABNORMAL HIGH (ref 77.6–95.7)
MEAN PLATELET VOLUME: 7.2 fL (ref 6.8–10.7)
MONOCYTES ABSOLUTE COUNT: 0.2 10*9/L — ABNORMAL LOW (ref 0.3–0.8)
MONOCYTES RELATIVE PERCENT: 3.3 %
NEUTROPHILS ABSOLUTE COUNT: 6.2 10*9/L (ref 1.8–7.8)
NEUTROPHILS RELATIVE PERCENT: 90.6 %
PLATELET COUNT: 166 10*9/L (ref 150–450)
RED BLOOD CELL COUNT: 2.31 10*12/L — ABNORMAL LOW (ref 3.95–5.13)
RED CELL DISTRIBUTION WIDTH: 28.8 % — ABNORMAL HIGH (ref 12.2–15.2)
WBC ADJUSTED: 6.8 10*9/L (ref 3.6–11.2)

## 2022-03-24 LAB — BASIC METABOLIC PANEL
ANION GAP: 10 mmol/L (ref 5–14)
BLOOD UREA NITROGEN: 21 mg/dL (ref 9–23)
BUN / CREAT RATIO: 32
CALCIUM: 8.2 mg/dL — ABNORMAL LOW (ref 8.7–10.4)
CHLORIDE: 114 mmol/L — ABNORMAL HIGH (ref 98–107)
CO2: 21 mmol/L (ref 20.0–31.0)
CREATININE: 0.65 mg/dL
EGFR CKD-EPI (2021) FEMALE: >90 mL/min/{1.73_m2} (ref >=60)
GLUCOSE RANDOM: 131 mg/dL (ref 70–179)
POTASSIUM: 3.9 mmol/L (ref 3.4–4.8)
SODIUM: 145 mmol/L (ref 135–145)

## 2022-03-24 LAB — URINALYSIS WITH MICROSCOPY
BACTERIA: NONE SEEN /HPF
BILIRUBIN UA: NEGATIVE
BLOOD UA: NEGATIVE
KETONES UA: 60 — AB
NITRITE UA: NEGATIVE
PH UA: 6.5 (ref 5.0–9.0)
RBC UA: 1 /HPF (ref <=4)
SPECIFIC GRAVITY UA: 1.02 (ref 1.003–1.030)
SQUAMOUS EPITHELIAL: <1 /HPF (ref 0–5)
UROBILINOGEN UA: <2
WBC UA: 7 /HPF — ABNORMAL HIGH (ref 0–5)

## 2022-03-24 LAB — MAGNESIUM: MAGNESIUM: 2 mg/dL (ref 1.6–2.6)

## 2022-03-24 MED ADMIN — mesna (MESNEX) 1,170 mg in sodium chloride (NS) 0.9 % 1,000 mL IVPB: 600 mg/m2 | INTRAVENOUS | @ 02:00:00 | Stop: 2022-03-24

## 2022-03-24 MED ADMIN — ondansetron (ZOFRAN) tablet 24 mg: 24 mg | ORAL | @ 01:00:00 | Stop: 2022-03-25

## 2022-03-24 MED ADMIN — dasatinib (SPRYCEL) tablet 70 mg: 70 mg | ORAL | @ 22:00:00

## 2022-03-24 MED ADMIN — cetirizine (ZyrTEC) tablet 10 mg: 10 mg | ORAL | @ 13:00:00

## 2022-03-24 MED ADMIN — bisacodyL (DULCOLAX) EC tablet 5 mg: 5 mg | ORAL | @ 15:00:00

## 2022-03-24 MED ADMIN — sodium chloride (NS) 0.9 % flush 10 mL: 10 mL | INTRAVENOUS | @ 01:00:00

## 2022-03-24 MED ADMIN — sodium chloride (NS) 0.9 % flush 10 mL: 10 mL | INTRAVENOUS | @ 13:00:00

## 2022-03-24 MED ADMIN — dexAMETHasone (DECADRON) tablet 40 mg: 40 mg | ORAL | @ 01:00:00 | Stop: 2022-03-25

## 2022-03-24 MED ADMIN — furosemide (LASIX) tablet 40 mg: 40 mg | ORAL | @ 22:00:00 | Stop: 2022-03-24

## 2022-03-24 MED ADMIN — dapsone tablet 100 mg: 100 mg | ORAL | @ 13:00:00

## 2022-03-24 MED ADMIN — cycloPHOSphamide (CYTOXAN) 585 mg in sodium chloride (NS) 0.9 % 250 mL IVPB: 300 mg/m2 | INTRAVENOUS | @ 16:00:00 | Stop: 2022-03-24

## 2022-03-24 MED ADMIN — OLANZapine (ZyPREXA) tablet 10 mg: 10 mg | ORAL | @ 01:00:00 | Stop: 2022-03-25

## 2022-03-24 MED ADMIN — valACYclovir (VALTREX) tablet 500 mg: 500 mg | ORAL | @ 22:00:00

## 2022-03-24 MED ADMIN — cycloPHOSphamide (CYTOXAN) 585 mg in sodium chloride (NS) 0.9 % 250 mL IVPB: 300 mg/m2 | INTRAVENOUS | @ 03:00:00 | Stop: 2022-03-24

## 2022-03-24 NOTE — Unmapped (Signed)
Problem: Anemia (Chemotherapy Effects)  Goal: Anemia Symptom Improvement  Outcome: Ongoing - Unchanged  Intervention: Monitor and Manage Anemia  Oral Nutrition Promotion: calorie-dense foods provided  Safety Interventions:   chemotherapeutic agent precautions   fall reduction program maintained   lighting adjusted for tasks/safety   low bed   nonskid shoes/slippers when out of bed  Fatigue Management: activity schedule adjusted  Note: Pt is inpatient for C5 of HyperCVAD, VSS, afebrile, on room air, lungs clear and diminished, pt up ad lib to bathroom voiding, has hx of hemorrhoids and rectal fissure , would like to try colace today and order received from provider. Hgb today 7.6.  Pt tolerated cytoxan overnight, mesna infusing continuously, Right double hub port dsg changed with no ss of infection, lumens patent with blood return, safety maintained, call bell in reach,  see labs in [facility] and flowsheets for data.      Problem: Adult Inpatient Plan of Care  Goal: Plan of Care Review  Outcome: Ongoing - Unchanged  Goal: Absence of Hospital-Acquired Illness or Injury  Intervention: Identify and Manage Fall Risk  Safety Interventions:   chemotherapeutic agent precautions   fall reduction program maintained   lighting adjusted for tasks/safety   low bed   nonskid shoes/slippers when out of bed  Intervention: Prevent Skin Injury  Skin Protection: protective footwear used  Goal: Optimal Comfort and Wellbeing  Intervention: Monitor Pain and Promote Comfort  Pain Management Interventions: care clustered     Problem: Urinary Bleeding Risk or Actual (Chemotherapy Effects)  Goal: Absence of Hematuria  Outcome: Ongoing - Unchanged  Intervention: Prevent or Manage Hemorrhagic Cystitis  Pain Management Interventions: care clustered  Hyperhydration Management:   fluids provided   encouraged to void  Note: UA sent daily, today's pending pt up to bathroom ad lib.      Problem: Nausea and Vomiting (Chemotherapy Effects)  Goal: Fluid and Electrolyte Balance  Outcome: Ongoing - Unchanged  Intervention: Prevent and Manage Nausea and Vomiting  Environmental Support: calm environment promoted  Oral Care:   teeth brushed   tongue brushed   lip/mouth moisturizer applied  Note: Regimen followed, antiemetics given as ordered, pt tolerating PO intake well, no complaints offered. (See EMAR and treatment plan in EPIC)

## 2022-03-24 NOTE — Unmapped (Signed)
Problem: Adult Inpatient Plan of Care  Goal: Plan of Care Review  Outcome: Ongoing - Unchanged  Goal: Patient-Specific Goal (Individualized)  Outcome: Ongoing - Unchanged  Goal: Absence of Hospital-Acquired Illness or Injury  Outcome: Ongoing - Unchanged  Intervention: Identify and Manage Fall Risk  Recent Flowsheet Documentation  Taken 03/23/2022 0800 by Primitivo Gauze, RN  Safety Interventions:   environmental modification   fall reduction program maintained   lighting adjusted for tasks/safety   low bed   nonskid shoes/slippers when out of bed  Intervention: Prevent and Manage VTE (Venous Thromboembolism) Risk  Recent Flowsheet Documentation  Taken 03/23/2022 0800 by Primitivo Gauze, RN  Activity Management: activity adjusted per tolerance  Intervention: Prevent Infection  Recent Flowsheet Documentation  Taken 03/23/2022 0800 by Primitivo Gauze, RN  Infection Prevention:   cohorting utilized   environmental surveillance performed   equipment surfaces disinfected   hand hygiene promoted   personal protective equipment utilized  Goal: Optimal Comfort and Wellbeing  Outcome: Ongoing - Unchanged  Goal: Readiness for Transition of Care  Outcome: Ongoing - Unchanged  Goal: Rounds/Family Conference  Outcome: Ongoing - Unchanged     Problem: Anemia (Chemotherapy Effects)  Goal: Anemia Symptom Improvement  Outcome: Ongoing - Unchanged  Intervention: Monitor and Manage Anemia  Recent Flowsheet Documentation  Taken 03/23/2022 0800 by Primitivo Gauze, RN  Safety Interventions:   environmental modification   fall reduction program maintained   lighting adjusted for tasks/safety   low bed   nonskid shoes/slippers when out of bed     Problem: Urinary Bleeding Risk or Actual (Chemotherapy Effects)  Goal: Absence of Hematuria  Outcome: Ongoing - Unchanged     Problem: Nausea and Vomiting (Chemotherapy Effects)  Goal: Fluid and Electrolyte Balance  Outcome: Ongoing - Unchanged

## 2022-03-24 NOTE — Unmapped (Cosign Needed)
Gynecology Consult Note    Requesting Service:  4 ONC  Requesting Attending: [name], MD  Reason for Consult: Vaginal lesion    ASSESSMENT & RECOMMENDATIONS     Patricia [name] is a 45 y.o. F w/ ph+ ALL. Gyn team consulted to assess vaginal lesion.     Sebaceous Cyst vs Ingrown hair  - Sub millimeter lesion on right inferior border on labia minora without abnormal or concerning skin changes. Lesion is not consistent with a genital wart or infectious pathogen.  - Recommend warm compresses if bothersome  - No follow up needed    Discussed with Attending Dr. Erick Blinks who is in agreement with the assessment and plan.    Thank you for the opportunity to participate in the care of this patient. Please page the [facility] Gy[facility]cology team at [phone] with any further questions.    HISTORY     Chief Complaint: No chief complaint on file.      History of Present Illness:  Patricia [name] is a 46 y.o. admitted to 4 ONC for management of chemotherapy. Pt has Ph+ ALL.   Gyn team consulted to assess a vaginal lesion noted by patient this AM while taking a shower. Pt reports having some urinary discomfort described as pain like when you have a kidney stone. Denies any vaginal bleeding, itching, discharge.       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       Social History:  Social History     Socioeconomic History    Marital status: Married   Tobacco Use    Smoking status: Never    Smokeless tobacco: Never     Social Determinants of Health     Financial Resource Strain: Low Risk  (11/02/2021)    Overall Financial Resource Strain (CARDIA)     Difficulty of Paying Living Expenses: Not hard at all   Food Insecurity: No Food Insecurity (11/02/2021)    Hunger Vital Sign     Worried About Running Out of Food in the Last Year: Never true     Ran Out of Food in the Last Year: Never true   Transportation Needs: No Transportation Needs (11/02/2021)    PRAPARE - Therapist, art (Medical): No     Lack of Transportation (Non-Medical): No       Family History:  History reviewed. No pertinent family history.     Home Medications:  No current facility-administered medications on file prior to encounter.     Current Outpatient Medications on File Prior to Encounter   Medication Sig    acetaminophen (TYLENOL 8 HOUR) 650 MG CR tablet Take 1 tablet (650 mg total) by mouth every eight (8) hours as needed for pain.    albuterol HFA 90 mcg/actuation inhaler Inhale 2 puffs every six (6) hours as needed for wheezing.    [EXPIRED] bismuth subsalicylate (PEPTO BISMOL) 262 mg/15 mL suspension Take 30 mL by mouth every six (6) hours as needed for  indigestion.    cetirizine (ZYRTEC) 10 MG tablet Take 1 tablet (10 mg total) by mouth nightly.    dapsone 100 MG tablet Take 1 tablet (100 mg total) by mouth daily.    dasatinib (SPRYCEL) 70 MG tablet Take 1 tablet (70 mg total) by mouth daily.    filgrastim-aafi (NIVESTYM) 480 mcg/0.8 mL Syrg injection syringe Inject the contents of one syringe (480 mcg or 0.8 mL) under the skin once daily for 7 days with each cycle of chemotherapy. Administer 24 hours after completion of chemotherapy.    levalbuterol (XOPENEX) 0.31 mg/3 mL nebulizer solution Inhale 3 mL (0.31 mg total) by nebulization every four (4) hours as needed for wheezing (cough).    NON FORMULARY PATIENT SUPPLIED MED Apply 1 application. topically Three (3) times a day as needed. Doctor Butler's Cream [[employer]]    oxyCODONE (ROXICODONE) 5 MG immediate release tablet Take 1-2 tablets (5-10 mg total) by mouth every four (4) hours as needed for pain    prochlorperazine (COMPAZINE) 10 MG tablet Take 1 tablet (10 mg total) by mouth every six (6) hours as needed for up to 14 days.    valACYclovir (VALTREX) 500 MG tablet Take 1 tablet (500 mg total) by mouth daily.       Allergies:   Allergies   Allergen Reactions    Erythromycin Hives    Other      Pt cant take ibuprofen due to condition.    Sulfa (Sulfonamide Antibiotics) Anaphylaxis    Azithromycin        Review of Systems: As per HPI, otherwise negative for balance of 10 systems.    PHYSICAL EXAM     BP 104/67  - Pulse 95  - Temp 36.8 ??C (98.2 ??F) (Oral) Comment: charted for Alfonse Alpers - Resp 17  - Ht 167.5 cm (5' 5.95)  - Wt 82.2 kg (181 lb 3.5 oz)  - SpO2 91%  - BMI 29.30 kg/m??     Constitutional: No apparent distress.  CV: Regular rate and rhythm.    Pulm: Normal work of breathing.   Abdominal: Soft, not TTP, no guarding, no rigidity, or no rebound.   Genitourinary: Normal external female genitalia. Solitary submillimeter raised bump without abnormal skin changes or texture on labia minora.  Extremities: No edema or calf tenderness bilaterally.  Neurological: She is alert and conversational.   Skin: Skin is warm and dry. No rash noted.   Psychiatric: Normal mood and affect.     LABS and IMAGING     Recent Results (from the past 24 hour(s))   Basic Metabolic Panel    Collection Time: 03/24/22  1:10 AM   Result Value Ref Range    Sodium 145 135 - 145 mmol/L    Potassium 3.9 3.4 - 4.8 mmol/L    Chloride 114 (H) 98 - 107 mmol/L    CO2 21.0 20.0 - 31.0 mmol/L    Anion Gap 10 5 - 14 mmol/L    BUN 21 9 - 23 mg/dL    Creatinine 1.61 0.96 - 0.80 mg/dL    BUN/Creatinine Ratio 32     eGFR CKD-EPI (2021) Female >90 >=60 mL/min/1.36m2    Glucose 131 70 - 179 mg/dL    Calcium 8.2 (L) 8.7 - 10.4 mg/dL   Magnesium Level    Collection Time: 03/24/22  1:10 AM   Result Value Ref Range    Magnesium 2.0 1.6 - 2.6 mg/dL   CBC w/ Differential    Collection Time:  03/24/22  1:10 AM   Result Value Ref Range    WBC 6.8 3.6 - 11.2 10*9/L    RBC 2.31 (L) 3.95 - 5.13 10*12/L    HGB 7.6 (L) 11.3 - 14.9 g/dL    HCT 16.1 (L) 09.6 - 44.0 %    MCV 97.6 (H) 77.6 - 95.7 fL    MCH 32.8 (H) 25.9 - 32.4 pg    MCHC 33.6 32.0 - 36.0 g/dL    RDW 04.5 (H) 40.9 - 15.2 %    MPV 7.2 6.8 - 10.7 fL    Platelet 166 150 - 450 10*9/L    Neutrophils % 90.6 %    Lymphocytes % 6.0 %    Monocytes % 3.3 %    Eosinophils % 0.0 %    Basophils % 0.1 %    Absolute Neutrophils 6.2 1.8 - 7.8 10*9/L    Absolute Lymphocytes 0.4 (L) 1.1 - 3.6 10*9/L    Absolute Monocytes 0.2 (L) 0.3 - 0.8 10*9/L    Absolute Eosinophils 0.0 0.0 - 0.5 10*9/L    Absolute Basophils 0.0 0.0 - 0.1 10*9/L    Anisocytosis Marked (A) Not Present   Prepare RBC    Collection Time: 03/24/22  7:00 AM   Result Value Ref Range    Crossmatch Compatible     Unit Blood Type O Pos     ISBT Number 5100     Unit # [id number]     Status Transfused     Product ID Red Blood Cells     PRODUCT CODE [id number]    Urinalysis with Microscopy    Collection Time: 03/24/22 11:24 AM   Result Value Ref Range    Color, UA Light Yellow     Clarity, UA Clear     Specific Gravity, UA 1.020 1.003 - 1.030    pH, UA 6.5 5.0 - 9.0    Leukocyte Esterase, UA Small (A) Negative    Nitrite, UA Negative Negative    Protein, UA Trace (A) Negative    Glucose, UA Trace (A) Negative    Ketones, UA 60 mg/dL (A) Negative    Urobilinogen, UA <2.0 mg/dL <4.6 mg/dL    Bilirubin, UA Negative Negative    Blood, UA Negative Negative    RBC, UA 1 <=4 /HPF    WBC, UA 7 (H) 0 - 5 /HPF    Squam Epithel, UA <1 0 - 5 /HPF    Bacteria, UA None Seen None Seen /HPF    Mucus, UA Rare (A) None Seen /HPF       ECG 12 Lead    Result Date: 03/22/2022  NORMAL SINUS RHYTHM LOW VOLTAGE QRS PROLONGED QT ABNORMAL ECG WHEN COMPARED WITH ECG OF 25-Feb-2022 19:28, NO SIGNIFICANT CHANGE WAS FOUND Confirmed by Warnell Forester ([id number]) on 03/22/2022 9:19:20 PM    FL Lumbar Puncture With Chemo    Result Date: 03/22/2022  EXAM: Intrathecal chemotherapy, injection under fluoroscopic guidance.  Imaging supervision and interpretation. DATE: 03/22/2022 2:23 PM ACCESSION: [id number] UN DICTATED: 03/22/2022 4:09 PM INTERPRETATION LOCATION: [facility] CLINICAL INDICATION: 46 years old Female with IT chemo  COMPARISON: February 18, 2022 and prior studies FLUOROSCOPY TIME: 0.32 minutes CONSENT: The potential risks and benefits of the procedure were discussed and all questions were answered and written and verbal consent was obtained and documented by Lucia Estelle .  TIME OUT: A time out was performed to verify patient, procedure, and site. During the patient time out, name and DOB patient  identifiers are checked verbally (if possible) and also with the patients wrist band prior to the procedure PROCEDURE: The patient was placed in a prone oblique position on the fluoroscopy table, and the lower back was prepped and draped in the usual sterile fashion.  Local anesthesia was provided at the selected puncture site using 1% lidocaine.  Using sterile technique and intermittent fluoroscopic guidance, a 20-gauge, 9 cm spinal needle was introduced into the thecal sac at L3-4 using a sublaminar approach.  There was spontaneous return of clear CSF.  Approximately 6 mL of CSF was collected.  The specimen was sent to the lab. Approximately 6 mL of methotrexate/hydrocortisone was instilled over 3-5 minutes by Lucia Estelle.  The stylet was replaced, and the needle was removed. A sterile bandage was placed at the puncture site.  The patient was instructed to lie supine for at least one hour.  The patient tolerated the procedure without immediate complications. ATTESTATION: Dr. Gardiner Sleeper was present for the critical portions, and immediately available for the remainder of this procedure.     Lumbar puncture using fluoroscopic guidance with intrathecal chemotherapy.

## 2022-03-24 NOTE — Unmapped (Signed)
Daily Progress Note      Active Problems:    * No active hospital problems. *       LOS: 3 days     Assessment/Plan:Patricia [name] is an 46 y.o. female who was admitted for C5 A of HyperCVAD + dasatinib.    Plan Summary: D4=8/24 of HyperCVAD+Dasatinib. LP/IT chemo in [place] on 8/23, Hempath negative for disease involvement. 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 GI surgery consulted for hemorrhoid/anal fissure management will increase bowel regimen. Asymptomatic and intermittent tachycardia, CTM. Neupogen x 7 days on discharge.      Ph+ ALL, in remission: Initially diagnosed in 09/2021 and underwent [id number] induction complicated by PICC line associated superficial thrombus. BMBx on 5/2 revealed morphologic CR. C4 was complicated by admission for neutropenic fever with unclear source, though respiratory origin suspected given subtle LLL centrilobular groundglass opacities and patient presented with worsening cough. Discharged 7/28 after resolution of fevers off cefepime. Admitted for Cycle 5A HyperCVAD + Dasatinib. Patient has completed 7 IT chemo treatments prior to admission, with plan to complete one with each cycle d/t patient intolerance of 2/cycle.  - C5D1 HyperCVAD (A cycle) = 03/21/22   - Dasatinib 70 mg daily   - ppx Dapsone and Valtrex   - IT MTX on 8/22 in [place] HP negative     Regimen: R-HyperCVAD  Cycle # 5  Primary Oncologist: Dr. Senaida Ores     Odd Cycles  Date 8/21           Day 1 2 3 4 5    Doxorubicin 50 mg/m2       x     Vincristine 2 mg       x     Cyclophosphamide 300 mg/m2 Q12H X  x X  x X  x       Dexamethasone 40mg  x x x x     Methotrexate IT 12mg    x            - Daily urinalysis, monitor for hemorraghic cystitis  - Monitor for PN and constipation      Disposition:  all needs to be requested.  - Neupogen self injection x 7 days   - Followup lab tests   - Follow up with Dr. Senaida Ores   - D11-14 Dex, RX on dishcarge   - D11 Vincristine appointment needs to be requested     Migraines: Patient with longstanding history of recurrent unilateral head, neck, sinus pain in addition to postnasal drip and rhinorrhea.  Severe but dull pain that lasts for days, sensitive to light and sound.  Patient has received several courses of Augmentin with concern that this represented sinus infections; however CT sinus performed 7/20 revealed only minimal mucosal thickening of the right maxillary and right sphenoid sinuses.  ENT consult note from recent admission noted no signs of active infection.  Seen by neurology during recent admission who believed this was more consistent with migraine headaches.  They recommended conservative management and outpatient follow-up in neurology clinic.  - Magnesium, riboflavin supplementation  - Heating pads, neck stretches  [ ]  Ensure patient has neurology outpatient follow-up on discharge     Hemorrhoids - Anal fissure: Patient continues to have significant pain from hemorrhoids.  She has trialed Sitz baths, lidocaine-based topical creams, calcium channel blocker creams, hydrocortisone cream without improvement.  Ambulatory GI referral placed on discharge from last hospitalization, however patient has not seen them yet .  -  GI surgery consult   - Colase daily 200mg   - Bisacodyl 5mg  daily  -OCT paste     Grade 1 peripheral neuropathy: Paraesthesias present primarily in her finger pads, currently without medical interventions continue to monitor while on therapy. Not affecting ADLs.     Anxiety: Reports taking Lexapro 10 mg at home, recently restarted.  - Continue Lexapro 10mg  daily     Cancer related fatigue:  Patient endorses fatigue with onset of cancer symptoms or treatment.  Symptoms started  with diagnosis weeks ago.  Primary symptoms are activity intolerance and fatigue     Immunocompromised status: Patient is immunocompromised secondary to ALL disease or chemotherapy  -Antimicrobial prophylaxis as above     Impending Electrolyte Abnormality Secondary to Chemotherapy and/or IV Fluids  -Daily Electrolyte monitoring  -Replete per [facility] guidelines.      Impending Pancytopenia secondary to Acute Leukemia/chemotherapy:   - Transfuse 1 unit of pRBCs for hgb >7  - Transfuse 1 unit plt for plts >10K       Nutrition:                        Subjective:   Afebrile, [name]. Reports a raised lesion on her vaginal. 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 She otherwise has no complaints.      10 point ROS otherwise negative except as above in the HPI.     Objective:     Vital signs in last 24 hours:  Temp:  [36.8 ??C (98.3 ??F)-37.2 ??C (98.9 ??F)] 36.8 ??C (98.3 ??F)  Heart Rate:  [90-101] 90  Resp:  [15-18] 15  BP: (118-132)/(75-91) 122/86  MAP (mmHg):  [90-101] 96  SpO2:  [91 %-92 %] 91 %    Intake/Output last 3 shifts:  I/O last 3 completed shifts:  In: 3552.2 [P.O.:480; I.V.:180; Blood:480.8; IV Piggyback:2411.4]  Out: -     Meds:  Current Facility-Administered Medications   Medication Dose Route Frequency Provider Last Rate Last Admin    bisacodyL (DULCOLAX) EC tablet 5 mg  5 mg Oral Daily Herbert Moors, AGNP   5 mg at 03/24/22 1120    cetirizine (ZyrTEC) tablet 10 mg  10 mg Oral Nightly Herbert Moors, AGNP   10 mg at 03/24/22 0910    CHEMO CLARIFICATION ORDER   Other Continuous PRN Maryanna Shape, PA        CHEMO CLARIFICATION ORDER   Other Continuous PRN Maryanna Shape, PA        cycloPHOSphamide (CYTOXAN) 585 mg in sodium chloride (NS) 0.9 % 250 mL IVPB  300 mg/m2 (Treatment Plan Recorded) Intravenous Q12H [name], MD 152.1 mL/hr at 03/24/22 1206 585 mg at 03/24/22 1206    dapsone tablet 100 mg  100 mg Oral Daily Herbert Moors, AGNP   100 mg at 03/24/22 0910    dasatinib (SPRYCEL) tablet 70 mg  70 mg Oral Daily [name], MD   70 mg at 03/23/22 1807    dexAMETHasone (DECADRON) tablet 40 mg  40 mg Oral Q24H [name], MD   40 mg at 03/23/22 2102    diphenhydrAMINE (BENADRYL) injection 25 mg  25 mg Intravenous Q4H PRN [name], MD        docusate sodium (COLACE) capsule 200 mg  200 mg Oral Daily Herbert Moors, AGNP        DOXOrubicin (ADRIAMYCIN) 97.5 mg in sodium  chloride (NS) 0.9 % 1,000 mL IVPB  50 mg/m2 (Treatment Plan Recorded) Intravenous Once [name], MD        Dr Silvana Newness Hemorrhoid & Fissure Ointment *Patient Home supplied*  1 application. Topical TID PRN Herbert Moors, AGNP        emollient combination no.92 (LUBRIDERM) lotion 1 application.  1 application. Topical Q1H PRN Herbert Moors, AGNP        EPINEPHrine [facility]) injection 0.3 mg  0.3 mg Intramuscular Daily PRN [name], MD        famotidine (PF) (PEPCID) injection 20 mg  20 mg Intravenous Q4H PRN [name], MD        IP OKAY TO TREAT   Other Continuous PRN Herbert Moors, AGNP        loperamide (IMODIUM) capsule 2 mg  2 mg Oral Q2H PRN Herbert Moors, AGNP        loperamide (IMODIUM) capsule 4 mg  4 mg Oral Once PRN Herbert Moors, AGNP        meperidine (DEMEROL) injection 25 mg  25 mg Intravenous Q30 Min PRN [name], MD        mesna (MESNEX) 1,170 mg in sodium chloride (NS) 0.9 % 1,000 mL IVPB  600 mg/m2 (Treatment Plan Recorded) Intravenous Q24H [name], MD 44.2 mL/hr at 03/23/22 2227 1,170 mg at 03/23/22 2227    methylPREDNISolone sodium succinate (PF) (SOLU-medrol) injection 125 mg  125 mg Intravenous Q4H PRN [name], MD        OLANZapine (ZyPREXA) tablet 10 mg  10 mg Oral Q24H [name], MD   10 mg at 03/23/22 2102    ondansetron (ZOFRAN) tablet 24 mg  24 mg Oral Q24H [name], MD   24 mg at 03/23/22 2102    oxyCODONE (ROXICODONE) immediate release tablet 5 mg  5 mg Oral Q4H PRN Herbert Moors, AGNP   5 mg at 03/22/22 0050    prochlorperazine (COMPAZINE) injection 10 mg  10 mg Intravenous Q6H PRN [name], MD        prochlorperazine (COMPAZINE) tablet 10 mg  10 mg Oral Q6H PRN [name], MD        sodium chloride (NS) 0.9 % flush 10 mL  10 mL Intravenous BID Herbert Moors, AGNP   10 mL at 03/24/22 0914    sodium chloride (NS) 0.9 % flush 10 mL  10 mL Intravenous BID Herbert Moors, AGNP   10 mL at 03/24/22 0914    sodium chloride (NS) 0.9 % infusion  20 mL/hr Intravenous Continuous Herbert Moors, AGNP        sodium chloride (NS) 0.9 % infusion  100 mL/hr Intravenous Continuous [name], MD 100 mL/hr at 03/21/22 2321 100 mL/hr at 03/21/22 2321    sodium chloride (NS) 0.9 % infusion  20 mL/hr Intravenous Continuous PRN [name], MD        sodium chloride 0.9% (NS) bolus 1,000 mL  1,000 mL Intravenous Daily PRN [name], MD        valACYclovir (VALTREX) tablet 500 mg  500 mg Oral Daily Herbert Moors, AGNP   500 mg at 03/23/22 1807    vinCRIStine (ONCOVIN) 2 mg in sodium chloride (NS) 0.9 % 25 mL IVPB  2 mg Intravenous Once [name], MD           Physical Exam:  General: Resting, in no apparent distress, lying in bed  HEENT:  PERRL. No scleral icterus or conjunctival injection. MMM without ulceration, erythema or exudate.   Heart:  RRR. S1, S2. No murmurs, gallops, or rubs.  Lungs:  Breathing is unlabored, and patient is speaking full sentences with ease.  No stridor.  CTAB. No rales, ronchi, or crackles.    Abdomen:  No distention or pain on palpation.  Bowel sounds are present and normoactive x 4.  No palpable hepatomegaly or splenomegaly.  No palpable masses.  Skin:  No rashes, petechiae or purpura.  No areas of skin breakdown. Warm to touch, dry, smooth, and even.  Musculoskeletal:  No grossly-evident joint effusions or deformities.  Range of motion about the shoulder, elbow, hips and knees is grossly normal.  Psychiatric:  Range of affect is appropriate.    Neurologic:  Alert and oriented to person, place, time and situation.  Gait is normal. CNII-CNXII grossly intact.  Extremities:  Appear well-perfused. No clubbing, edema, or cyanosis.  CVAD: R CW Port - no erythema, nontender; dressing CDI.      Labs:  Recent Labs     03/21/22  1409 03/22/22  0146 03/23/22  0125 03/24/22  0110   WBC 5.8 10.6 7.0 6.8   NEUTROABS 3.8 8.6* 5.6 6.2   LYMPHSABS 1.2 1.6 0.9* 0.4*   HGB 8.3* 8.5* 6.8* 7.6*   HCT 24.6* 25.0* 20.3* 22.6*   PLT 183 130* 136* 166   CREATININE 0.61 0.65 0.64 0.65   BUN 13 15 19 21    BILITOT 0.7 0.8  --   --    BILIDIR  --  0.20  --   --    AST 71* 58*  --   --    ALT 104* 91*  --   --    ALKPHOS 75 74  --   --    K 3.6 4.1 4.0 3.9   MG  --  2.0 2.0 2.0   CALCIUM 9.3 9.1 8.7 8.2*   NA 140 142 145 145   CL 106 111* 112* 114*   CO2 27.0 24.0 24.0 21.0   PHOS  --  2.9  --   --          Imaging:  None.     Herbert Moors, [place] 03/24/2022  12:25 PM   Nurse Practitioner  Hematology/Oncology Department   [facility]   Group Pager: [phone]

## 2022-03-25 LAB — URINALYSIS WITH MICROSCOPY
BACTERIA: NONE SEEN /HPF
BILIRUBIN UA: NEGATIVE
BLOOD UA: NEGATIVE
GLUCOSE UA: NEGATIVE
KETONES UA: NEGATIVE
NITRITE UA: NEGATIVE
PH UA: 6.5 (ref 5.0–9.0)
RBC UA: <1 /HPF (ref <=4)
SPECIFIC GRAVITY UA: 1.018 (ref 1.003–1.030)
SQUAMOUS EPITHELIAL: 1 /HPF (ref 0–5)
UROBILINOGEN UA: <2
WBC UA: 7 /HPF — ABNORMAL HIGH (ref 0–5)

## 2022-03-25 LAB — CBC W/ AUTO DIFF
BASOPHILS ABSOLUTE COUNT: 0 10*9/L (ref 0.0–0.1)
BASOPHILS RELATIVE PERCENT: 0.1 %
EOSINOPHILS ABSOLUTE COUNT: 0 10*9/L (ref 0.0–0.5)
EOSINOPHILS RELATIVE PERCENT: 0 %
HEMATOCRIT: 22.9 % — ABNORMAL LOW (ref 34.0–44.0)
HEMOGLOBIN: 7.7 g/dL — ABNORMAL LOW (ref 11.3–14.9)
LYMPHOCYTES ABSOLUTE COUNT: 0.2 10*9/L — ABNORMAL LOW (ref 1.1–3.6)
LYMPHOCYTES RELATIVE PERCENT: 2.7 %
MEAN CORPUSCULAR HEMOGLOBIN CONC: 33.7 g/dL (ref 32.0–36.0)
MEAN CORPUSCULAR HEMOGLOBIN: 32.5 pg — ABNORMAL HIGH (ref 25.9–32.4)
MEAN CORPUSCULAR VOLUME: 96.4 fL — ABNORMAL HIGH (ref 77.6–95.7)
MEAN PLATELET VOLUME: 7.1 fL (ref 6.8–10.7)
MONOCYTES ABSOLUTE COUNT: 0.1 10*9/L — ABNORMAL LOW (ref 0.3–0.8)
MONOCYTES RELATIVE PERCENT: 1.4 %
NEUTROPHILS ABSOLUTE COUNT: 5.5 10*9/L (ref 1.8–7.8)
NEUTROPHILS RELATIVE PERCENT: 95.8 %
PLATELET COUNT: 173 10*9/L (ref 150–450)
RED BLOOD CELL COUNT: 2.37 10*12/L — ABNORMAL LOW (ref 3.95–5.13)
RED CELL DISTRIBUTION WIDTH: 28.6 % — ABNORMAL HIGH (ref 12.2–15.2)
WBC ADJUSTED: 5.8 10*9/L (ref 3.6–11.2)

## 2022-03-25 LAB — HEPATIC FUNCTION PANEL
ALBUMIN: 3.8 g/dL (ref 3.4–5.0)
ALKALINE PHOSPHATASE: 62 U/L (ref 46–116)
ALT (SGPT): 105 U/L — ABNORMAL HIGH (ref 10–49)
AST (SGOT): 43 U/L — ABNORMAL HIGH (ref <=34)
BILIRUBIN DIRECT: 0.3 mg/dL (ref 0.00–0.30)
BILIRUBIN TOTAL: 1.1 mg/dL (ref 0.3–1.2)
PROTEIN TOTAL: 5.9 g/dL (ref 5.7–8.2)

## 2022-03-25 LAB — BASIC METABOLIC PANEL
ANION GAP: 9 mmol/L (ref 5–14)
BLOOD UREA NITROGEN: 18 mg/dL (ref 9–23)
BUN / CREAT RATIO: 28
CALCIUM: 8.3 mg/dL — ABNORMAL LOW (ref 8.7–10.4)
CHLORIDE: 111 mmol/L — ABNORMAL HIGH (ref 98–107)
CO2: 24 mmol/L (ref 20.0–31.0)
CREATININE: 0.64 mg/dL
EGFR CKD-EPI (2021) FEMALE: >90 mL/min/{1.73_m2} (ref >=60)
GLUCOSE RANDOM: 123 mg/dL (ref 70–179)
POTASSIUM: 3.8 mmol/L (ref 3.4–4.8)
SODIUM: 144 mmol/L (ref 135–145)

## 2022-03-25 LAB — MAGNESIUM: MAGNESIUM: 2 mg/dL (ref 1.6–2.6)

## 2022-03-25 MED ORDER — FLUCONAZOLE 200 MG TABLET
ORAL_TABLET | Freq: Every day | ORAL | 0 refills | 30 days | Status: CP
Start: 2022-03-25 — End: 2022-04-24

## 2022-03-25 MED ORDER — LEVOFLOXACIN 500 MG TABLET
ORAL_TABLET | ORAL | 0 refills | 30 days | Status: CP
Start: 2022-03-25 — End: 2022-04-24

## 2022-03-25 MED ADMIN — sodium chloride (NS) 0.9 % flush 10 mL: 10 mL | INTRAVENOUS | @ 01:00:00

## 2022-03-25 MED ADMIN — dexAMETHasone (DECADRON) tablet 40 mg: 40 mg | ORAL | Stop: 2022-03-24

## 2022-03-25 MED ADMIN — DOXOrubicin (ADRIAMYCIN) 97.5 mg in sodium chloride (NS) 0.9 % 1,000 mL IVPB: 50 mg/m2 | INTRAVENOUS | @ 01:00:00

## 2022-03-25 MED ADMIN — dasatinib (SPRYCEL) tablet 70 mg: 70 mg | ORAL | @ 22:00:00

## 2022-03-25 MED ADMIN — cetirizine (ZyrTEC) tablet 10 mg: 10 mg | ORAL | @ 13:00:00

## 2022-03-25 MED ADMIN — sodium chloride (NS) 0.9 % flush 10 mL: 10 mL | INTRAVENOUS | @ 13:00:00

## 2022-03-25 MED ADMIN — prochlorperazine (COMPAZINE) injection 10 mg: 10 mg | INTRAVENOUS | @ 16:00:00

## 2022-03-25 MED ADMIN — ondansetron (ZOFRAN) tablet 24 mg: 24 mg | ORAL | Stop: 2022-03-24

## 2022-03-25 MED ADMIN — vinCRIStine (ONCOVIN) 2 mg in sodium chloride (NS) 0.9 % 25 mL IVPB: 2 mg | INTRAVENOUS | @ 01:00:00 | Stop: 2022-03-24

## 2022-03-25 MED ADMIN — bismuth subsalicylate (PEPTO-BISMOL) oral suspension: 30 mL | ORAL | @ 23:00:00

## 2022-03-25 MED ADMIN — valACYclovir (VALTREX) tablet 500 mg: 500 mg | ORAL | @ 22:00:00

## 2022-03-25 MED ADMIN — OLANZapine (ZyPREXA) tablet 10 mg: 10 mg | ORAL | Stop: 2022-03-24

## 2022-03-25 MED ADMIN — oxyCODONE (ROXICODONE) immediate release tablet 5 mg: 5 mg | ORAL | @ 02:00:00 | Stop: 2022-04-04

## 2022-03-25 MED ADMIN — dapsone tablet 100 mg: 100 mg | ORAL | @ 13:00:00

## 2022-03-25 NOTE — Unmapped (Addendum)
Patricia [name] is an 46 y.o. female who was admitted for C5 A of HyperCVAD + dasatinib. Patient tolerated cycle well without adverse affects noted.  Patient will be discharged with  Dexamethasone for D11-D14.  Patient will discharge with valtrex, levaquin, fluconazole and Dapsone prophylaxis. Additional details of the hospital course available below.    Ph+ ALL, in remission: Initially diagnosed in 09/2021 and underwent [id number] induction complicated by PICC line associated superficial thrombus. BMBx on 5/2 revealed morphologic CR. C4 was complicated by admission for neutropenic fever with unclear source, though respiratory origin suspected given subtle LLL centrilobular groundglass opacities and patient presented with worsening cough. Discharged 7/28 after resolution of fevers off cefepime. Admitted for Cycle 5A HyperCVAD + Dasatinib. Patient has completed 7 IT chemo treatments prior to admission, with plan to complete one with each cycle d/t patient intolerance of 2/cycle. IT MTX on 8/22 in [place] HP negative  - C5D1 HyperCVAD (A cycle) = 03/21/22 , Dasatinib 70 mg daily   - ppx Dapsone, Levaquin,Fluconazole and Valtrex    Disposition:  Neupogen self injection x 7 days   - Transfusion support  starting 8/27  - Follow up with Patricia [name] 9/7   - D11-14 Dex, RX on dishcarge    -D11 Ritux and Vincristine    Migraines: Patient with longstanding history of recurrent unilateral head, neck, sinus pain in addition to postnasal drip and rhinorrhea.  Severe but dull pain that lasts for days, sensitive to light and sound.  Patient has received several courses of Augmentin with concern that this represented sinus infections; however CT sinus performed 7/20 revealed only minimal mucosal thickening of the right maxillary and right sphenoid sinuses.  ENT consult note from recent admission noted no signs of active infection.  Seen by neurology during recent admission who believed this was more consistent with migraine headaches. They recommended conservative management and outpatient follow-up in neurology clinic.Patient will follow Neurology in November 2023   Continue on discharge magnesium, riboflavin supplementation, Heating pads, neck stretches.    Hemorrhoids - Anal fissure: Patient continues to have significant pain from hemorrhoids.  She has trialed Sitz baths, lidocaine-based topical creams, calcium channel blocker creams, hydrocortisone cream without improvement.  Ambulatory GI referral placed on discharge from last hospitalization, however patient has not seen them yet . GI surgery consult will follow up as outpatient when chemotherapy has completed.  - Continue on discharge Colase daily 200mg , Bisacodyl 5mg  daily, OCT paste    Grade 1 peripheral neuropathy: Paraesthesias present primarily in her finger pads, currently without medical interventions continue to monitor while on therapy. Not affecting ADLs.     Anxiety: Reports taking Lexapro 10 mg at home, recently restarted.  - Continue Lexapro 10mg  daily on discharge     Cancer related fatigue:  Patient endorses fatigue with onset of cancer symptoms or treatment.  Symptoms started  with diagnosis weeks ago.  Primary symptoms are activity intolerance and fatigue     Immunocompromised status: Patient is immunocompromised secondary to ALL disease or chemotherapy  -Antimicrobial prophylaxis as above     Impending Electrolyte Abnormality Secondary to Chemotherapy and/or IV Fluids  -Daily Electrolyte monitoring  -Replete per [facility] guidelines.      Impending Pancytopenia secondary to Acute Leukemia/chemotherapy:   - Transfuse 1 unit of pRBCs for hgb >7  - Transfuse 1 unit plt for plts >10K

## 2022-03-25 NOTE — Unmapped (Signed)
Cytoxan given. Mesna still running. Pt had bowel movement. No complaints of pain.    Problem: Adult Inpatient Plan of Care  Goal: Plan of Care Review  Outcome: Ongoing - Unchanged  Goal: Patient-Specific Goal (Individualized)  Outcome: Ongoing - Unchanged  Goal: Absence of Hospital-Acquired Illness or Injury  Outcome: Ongoing - Unchanged  Intervention: Identify and Manage Fall Risk  Recent Flowsheet Documentation  Taken 03/24/2022 0800 by Primitivo Gauze, RN  Safety Interventions:   environmental modification   fall reduction program maintained   lighting adjusted for tasks/safety   nonskid shoes/slippers when out of bed  Intervention: Prevent and Manage VTE (Venous Thromboembolism) Risk  Recent Flowsheet Documentation  Taken 03/24/2022 0800 by Primitivo Gauze, RN  Activity Management: activity adjusted per tolerance  Intervention: Prevent Infection  Recent Flowsheet Documentation  Taken 03/24/2022 0800 by Primitivo Gauze, RN  Infection Prevention:   cohorting utilized   environmental surveillance performed   equipment surfaces disinfected   hand hygiene promoted   personal protective equipment utilized  Goal: Optimal Comfort and Wellbeing  Outcome: Ongoing - Unchanged  Goal: Readiness for Transition of Care  Outcome: Ongoing - Unchanged  Goal: Rounds/Family Conference  Outcome: Ongoing - Unchanged     Problem: Anemia (Chemotherapy Effects)  Goal: Anemia Symptom Improvement  Outcome: Ongoing - Unchanged  Intervention: Monitor and Manage Anemia  Recent Flowsheet Documentation  Taken 03/24/2022 0800 by Primitivo Gauze, RN  Safety Interventions:   environmental modification   fall reduction program maintained   lighting adjusted for tasks/safety   nonskid shoes/slippers when out of bed     Problem: Urinary Bleeding Risk or Actual (Chemotherapy Effects)  Goal: Absence of Hematuria  Outcome: Ongoing - Unchanged     Problem: Nausea and Vomiting (Chemotherapy Effects)  Goal: Fluid and Electrolyte Balance  Outcome: Ongoing - Unchanged

## 2022-03-25 NOTE — Unmapped (Signed)
Physician Discharge Summary [facility]  4 ONC [facility]  [address]  [place] [address]  Dept: [phone]  Loc: [phone]     Identifying Information:   Patricia [name]  1975-09-16  [id number]    Primary Care Physician: [name], MD     Referring Physician: Unknown Per Patient Ref*     Code Status: Full Code    Admit Date: 03/21/2022    Discharge Date: 03/25/2022     Discharge To: Home    Discharge Service: [facility] - Hematology APP Floor Team (MEDQ)     Discharge Attending Physician: [name], MD    Discharge Diagnoses:  Active Problems:    * No active hospital problems. *  Resolved Problems:    * No resolved hospital problems. *      Outpatient Provider Follow Up Issues:   Supportive Care Recommendations:  We recommend based on the patient???s underlying diagnosis and treatment history the following supportive care:    1. Antimicrobial prophylaxis:  ALL (in remission, receiving post-remission therapy): Bacterial: Levofloxacin 500mg  PO daily (when absolute neutrophils </= 0.5);   Fungal: Fluconazole 400mg  PO daily (when absolute neutrophils </= 0.5);   Viral: Valacyclovir 500mg  PO daily (Continuous);   PJP:  For SMX/TMP intolerant patients:  Dapsone 100mg  PO daily; monthly nebulized pentamidine    2. Blood product support:  Leukoreduced blood products are required.  Irradiated blood products are preferred, but in case of urgent transfusion needs non-irradiated blood products may be used:     -  RBC transfusion threshold: transfuse 2 units for Hgb < 8 g/dL.  -  Platelet transfusion threshold: transfuse 1 unit of platelets for platelet count < 10, or for bleeding or need for invasive procedure.    Based on the patient's disease status and intensity of therapy, complete blood count with differential should be evaluated 2 times per week and used to guide transfusion support    3. Hematopoietic growth factor support: other Neupogen Self injection X 7 days starting 8/27.    Hospital Course:   Patricia [name] is an 46 y.o. female who was admitted for C5 A of HyperCVAD + dasatinib. Patient tolerated cycle well without adverse affects noted.  Patient will be discharged with  Dexamethasone for D11-D14.  Patient will discharge with valtrex, levaquin, fluconazole and Dapsone prophylaxis. Additional details of the hospital course available below.    Ph+ ALL, in remission: Initially diagnosed in 09/2021 and underwent [id number] induction complicated by PICC line associated superficial thrombus. BMBx on 5/2 revealed morphologic CR. C4 was complicated by admission for neutropenic fever with unclear source, though respiratory origin suspected given subtle LLL centrilobular groundglass opacities and patient presented with worsening cough. Discharged 7/28 after resolution of fevers off cefepime. Admitted for Cycle 5A HyperCVAD + Dasatinib. Patient has completed 7 IT chemo treatments prior to admission, with plan to complete one with each cycle d/t patient intolerance of 2/cycle. IT MTX on 8/22 in [place] HP negative  - C5D1 HyperCVAD (A cycle) = 03/21/22 , Dasatinib 70 mg daily   - ppx Dapsone, Levaquin,Fluconazole and Valtrex    Disposition:  Neupogen self injection x 7 days   - Transfusion support  starting 8/27  - Follow up with [name] 9/7   - D11-14 Dex, RX on dishcarge    -D11 Ritux and Vincristine    Migraines: Patient with longstanding history of recurrent unilateral head, neck, sinus pain in addition to postnasal drip and rhinorrhea.  Severe but dull  pain that lasts for days, sensitive to light and sound.  Patient has received several courses of Augmentin with concern that this represented sinus infections; however CT sinus performed 7/20 revealed only minimal mucosal thickening of the right maxillary and right sphenoid sinuses.  ENT consult note from recent admission noted no signs of active infection.  Seen by neurology during recent admission who believed this was more consistent with migraine headaches.  They recommended conservative management and outpatient follow-up in neurology clinic.Patient will follow Neurology in November 2023   Continue on discharge magnesium, riboflavin supplementation, Heating pads, neck stretches.    Hemorrhoids - Anal fissure: Patient continues to have significant pain from hemorrhoids.  She has trialed Sitz baths, lidocaine-based topical creams, calcium channel blocker creams, hydrocortisone cream without improvement.  Ambulatory GI referral placed on discharge from last hospitalization, however patient has not seen them yet . GI surgery consult will follow up as outpatient when chemotherapy has completed.  - Continue on discharge Colase daily 200mg , Bisacodyl 5mg  daily, OCT paste    Grade 1 peripheral neuropathy: Paraesthesias present primarily in her finger pads, currently without medical interventions continue to monitor while on therapy. Not affecting ADLs.     Anxiety: Reports taking Lexapro 10 mg at home, recently restarted.  - Continue Lexapro 10mg  daily on discharge     Cancer related fatigue:  Patient endorses fatigue with onset of cancer symptoms or treatment.  Symptoms started  with diagnosis weeks ago.  Primary symptoms are activity intolerance and fatigue     Immunocompromised status: Patient is immunocompromised secondary to ALL disease or chemotherapy  -Antimicrobial prophylaxis as above     Impending Electrolyte Abnormality Secondary to Chemotherapy and/or IV Fluids  -Daily Electrolyte monitoring  -Replete per [facility] guidelines.      Impending Pancytopenia secondary to Acute Leukemia/chemotherapy:   - Transfuse 1 unit of pRBCs for hgb >7  - Transfuse 1 unit plt for plts >10K       Procedures:  IT chemotherapy in [facility]  [facility] INFUSION APPOINTMENT REQUEST [[id number]]  ______________________________________________________________________  Discharge Medications:     Your Medication List        ASK your doctor about these medications      acetaminophen 650 MG CR tablet  Commonly known as: TYLENOL 8 HOUR  Take 1 tablet (650 mg total) by mouth every eight (8) hours as needed for pain.     albuterol 90 mcg/actuation inhaler  Commonly known as: PROVENTIL HFA;VENTOLIN HFA  Inhale 2 puffs every six (6) hours as needed for wheezing.     cetirizine 10 MG tablet  Commonly known as: ZyrTEC  Take 1 tablet (10 mg total) by mouth nightly.     dapsone 100 MG tablet  Take 1 tablet (100 mg total) by mouth daily.     filgrastim-aafi 480 mcg/0.8 mL Syrg injection syringe  Commonly known as: NIVESTYM  Inject the contents of one syringe (480 mcg or 0.8 mL) under the skin once daily for 7 days with each cycle of chemotherapy. Administer 24 hours after completion of chemotherapy.     levalbuterol 0.31 mg/3 mL nebulizer solution  Commonly known as: XOPENEX  Inhale 3 mL (0.31 mg total) by nebulization every four (4) hours as needed for wheezing (cough).     NON FORMULARY PATIENT SUPPLIED MED  Apply 1 application. topically Three (3) times a day as needed. Doctor Butler's Cream [Amazon]     oxyCODONE 5 MG immediate release tablet  Commonly known as: ROXICODONE  Take  1-2 tablets (5-10 mg total) by mouth every four (4) hours as needed for pain     prochlorperazine 10 MG tablet  Commonly known as: COMPAZINE  Take 1 tablet (10 mg total) by mouth every six (6) hours as needed for up to 14 days.     SpryceL 70 MG tablet  Generic drug: dasatinib  Take 1 tablet (70 mg total) by mouth daily.     STOMACH RELIEF 262 mg/15 mL suspension  Generic drug: bismuth subsalicylate  Take 30 mL by mouth every six (6) hours as needed for indigestion.  Ask about: Should I take this medication?     valACYclovir 500 MG tablet  Commonly known as: VALTREX  Take 1 tablet (500 mg total) by mouth daily.              Allergies:  Erythromycin, Other, Sulfa (sulfonamide antibiotics), and Azithromycin  ______________________________________________________________________  Pending Test Results (if blank, then none):  Pending Labs       Order Current Status    Type and Screen subsequent Collected (03/24/22 0110)    Urine Culture In process            Most Recent Labs:  All lab results last 24 hours -   Recent Results (from the past 24 hour(s))   Hepatic Function Panel    Collection Time: 03/25/22 12:37 AM   Result Value Ref Range    Albumin 3.8 3.4 - 5.0 g/dL    Total Protein 5.9 5.7 - 8.2 g/dL    Total Bilirubin 1.1 0.3 - 1.2 mg/dL    Bilirubin, Direct 6.29 0.00 - 0.30 mg/dL    AST 43 (H) <=52 U/L    ALT 105 (H) 10 - 49 U/L    Alkaline Phosphatase 62 46 - 116 U/L   Basic Metabolic Panel    Collection Time: 03/25/22 12:37 AM   Result Value Ref Range    Sodium 144 135 - 145 mmol/L    Potassium 3.8 3.4 - 4.8 mmol/L    Chloride 111 (H) 98 - 107 mmol/L    CO2 24.0 20.0 - 31.0 mmol/L    Anion Gap 9 5 - 14 mmol/L    BUN 18 9 - 23 mg/dL    Creatinine 8.41 3.24 - 0.80 mg/dL    BUN/Creatinine Ratio 28     eGFR CKD-EPI (2021) Female >90 >=60 mL/min/1.5m2    Glucose 123 70 - 179 mg/dL    Calcium 8.3 (L) 8.7 - 10.4 mg/dL   Magnesium Level    Collection Time: 03/25/22 12:37 AM   Result Value Ref Range    Magnesium 2.0 1.6 - 2.6 mg/dL   CBC w/ Differential    Collection Time: 03/25/22 12:37 AM   Result Value Ref Range    WBC 5.8 3.6 - 11.2 10*9/L    RBC 2.37 (L) 3.95 - 5.13 10*12/L    HGB 7.7 (L) 11.3 - 14.9 g/dL    HCT 40.1 (L) 02.7 - 44.0 %    MCV 96.4 (H) 77.6 - 95.7 fL    MCH 32.5 (H) 25.9 - 32.4 pg    MCHC 33.7 32.0 - 36.0 g/dL    RDW 25.3 (H) 66.4 - 15.2 %    MPV 7.1 6.8 - 10.7 fL    Platelet 173 150 - 450 10*9/L    Neutrophils % 95.8 %    Lymphocytes % 2.7 %    Monocytes % 1.4 %    Eosinophils % 0.0 %  Basophils % 0.1 %    Absolute Neutrophils 5.5 1.8 - 7.8 10*9/L    Absolute Lymphocytes 0.2 (L) 1.1 - 3.6 10*9/L    Absolute Monocytes 0.1 (L) 0.3 - 0.8 10*9/L    Absolute Eosinophils 0.0 0.0 - 0.5 10*9/L    Absolute Basophils 0.0 0.0 - 0.1 10*9/L    Anisocytosis Marked (A) Not Present   Type and Screen    Collection Time: 03/25/22 12:37 AM   Result Value Ref Range ABO Grouping O POS     Antibody Screen NEG    Urinalysis with Microscopy    Collection Time: 03/25/22 10:38 AM   Result Value Ref Range    Color, UA Light Yellow     Clarity, UA Clear     Specific Gravity, UA 1.018 1.003 - 1.030    pH, UA 6.5 5.0 - 9.0    Leukocyte Esterase, UA Trace (A) Negative    Nitrite, UA Negative Negative    Protein, UA Trace (A) Negative    Glucose, UA Negative Negative    Ketones, UA Negative Negative    Urobilinogen, UA <2.0 mg/dL <1.6 mg/dL    Bilirubin, UA Negative Negative    Blood, UA Negative Negative    RBC, UA <1 <=4 /HPF    WBC, UA 7 (H) 0 - 5 /HPF    Squam Epithel, UA 1 0 - 5 /HPF    Bacteria, UA None Seen None Seen /HPF    Mucus, UA Rare (A) None Seen /HPF   Prepare RBC    Collection Time: 03/25/22 12:28 PM   Result Value Ref Range    Unit Blood Type O Pos     ISBT Number 5100     Unit # [id number]     Status Released to Avail     Spec Expiration [id number]     Product ID Red Blood Cells     PRODUCT CODE [id number]    Prepare RBC    Collection Time: 03/25/22 12:33 PM   Result Value Ref Range    Crossmatch Compatible     Unit Blood Type O Pos     ISBT Number 5100     Unit # [id number]     Status Issued     Spec Expiration [id number]     Product ID Red Blood Cells     PRODUCT CODE [id number]      Microbiology -   Microbiology Results (last day)       Procedure Component Value Date/Time Date/Time    Urine Culture [[id number]] Collected: 03/25/22 0043    Lab Status: In process Specimen: Urine from Clean Catch Updated: 03/25/22 0109            Relevant Studies/Radiology (if blank, then none):  ECG 12 Lead    Result Date: 03/22/2022  NORMAL SINUS RHYTHM LOW VOLTAGE QRS PROLONGED QT ABNORMAL ECG WHEN COMPARED WITH ECG OF 25-Feb-2022 19:28, NO SIGNIFICANT CHANGE WAS FOUND Confirmed by Warnell Forester ([id number]) on 03/22/2022 9:19:20 PM    FL Lumbar Puncture With Chemo    Result Date: 03/22/2022  EXAM: Intrathecal chemotherapy, injection under fluoroscopic guidance.  Imaging supervision and interpretation. DATE: 03/22/2022 2:23 PM ACCESSION: [id number] UN DICTATED: 03/22/2022 4:09 PM INTERPRETATION LOCATION: [facility] CLINICAL INDICATION: 46 years old Female with IT chemo  COMPARISON: February 18, 2022 and prior studies FLUOROSCOPY TIME: 0.32 minutes CONSENT: The potential risks and benefits of the procedure were discussed and all questions were answered and written and verbal consent was obtained and  documented by Lucia Estelle .  TIME OUT: A time out was performed to verify patient, procedure, and site. During the patient time out, name and DOB patient identifiers are checked verbally (if possible) and also with the patients wrist band prior to the procedure PROCEDURE: The patient was placed in a prone oblique position on the fluoroscopy table, and the lower back was prepped and draped in the usual sterile fashion.  Local anesthesia was provided at the selected puncture site using 1% lidocaine.  Using sterile technique and intermittent fluoroscopic guidance, a 20-gauge, 9 cm spinal needle was introduced into the thecal sac at L3-4 using a sublaminar approach.  There was spontaneous return of clear CSF.  Approximately 6 mL of CSF was collected.  The specimen was sent to the lab. Approximately 6 mL of methotrexate/hydrocortisone was instilled over 3-5 minutes by Lucia Estelle.  The stylet was replaced, and the needle was removed. A sterile bandage was placed at the puncture site.  The patient was instructed to lie supine for at least one hour.  The patient tolerated the procedure without immediate complications. ATTESTATION: Dr. Gardiner Sleeper was present for the critical portions, and immediately available for the remainder of this procedure.     Lumbar puncture using fluoroscopic guidance with intrathecal chemotherapy.   ______________________________________________________________________                Follow Up instructions and Outpatient Referrals     CBC and differential      Bilirubin, total CBC and differential          Appointments which have been scheduled for you      Mar 27, 2022  8:00 AM  (Arrive by 7:30 AM)  BLOOD TRANSFUSION - 2 UNITS with [employer] CHAIR 01  [place] ONCOLOGY INFUSION [place] [facility] REGION) [address]  [place] [address]  [phone]        Mar 31, 2022 12:45 PM  (Arrive by 12:15 PM)  NURSE LAB DRAW with ADULT ONC LAB  [facility] ADULT ONCOLOGY LAB DRAW STATION [place] [facility] REGION) [address]  [place] [address]  [phone]        Mar 31, 2022  2:00 PM  (Arrive by 1:30 PM)  LEVEL 120 with [employer] CHAIR 07  [place] ONCOLOGY INFUSION [place] [facility] REGION) [address]  [place] [address]  [phone]        Apr 04, 2022 11:00 AM  (Arrive by 10:30 AM)  BLOOD TRANSFUSION - 2 UNITS with [employer] CHAIR 14  [place] ONCOLOGY INFUSION [facility] REGION) [address]  [place] [address]  [phone]        Apr 06, 2022  1:45 PM  (Arrive by 1:30 PM)  NEW ENT with Bayard Hugger SINUS FELLOW  [place] [facility]OLOG[facility] [address] [address] [address] [facility] REGION) [address]  [address]  [place] [address]  [phone]        Apr 07, 2022  9:45 AM  (Arrive by 9:15 AM)  LAB ONLY [place] with ADULT ONC LAB  [facility] ADULT ONCOLOGY LAB DRAW STATION [place] [facility] REGION) [address]  [place] [address]  [phone]        Apr 07, 2022 10:30 AM  (Arrive by 10:00 AM)  RETURN ACTIVE [place] with Lenon Ahmadi, AGNP  [place] HEMATOLOGY ONCOLOGY 2ND FLR [facility] H[facility]P ([place]  [facility] REGION) [address]  [place] [address]  [phone]        Apr 07, 2022  1:30 PM  (Arrive by 1:00 PM)  BLOOD TRANSFUSION - 2 UNITS with [employer] CHAIR 25  [place] ONCOLOGY INFUSION [place] [facility] REGION) [address]  [place] [address]  [phone]        Apr 11, 2022 12:30 PM  (Arrive by 12:00 PM)  NURSE LAB DRAW with ADULT ONC LAB  [facility] ADULT ONCOLOGY LAB DRAW STATION [place] [facility] REGION) [address]  [place] [address]  [phone]        Apr 11, 2022  1:30 PM  (Arrive by 1:00 PM)  BLOOD TRANSFUSION - 2 UNITS with [employer] CHAIR 33  [place] ONCOLOGY INFUSION [place] [facility] REGION) [address]  [place] [address]  [phone]        Apr 14, 2022 12:30 PM  (Arrive by 12:00 PM)  BLOOD TRANSFUSION - 2 UNITS with [employer] CHAIR 25  [place] ONCOLOGY INFUSION [place] [facility] REGION) [address]  [place] [address]  [phone]        Jun 30, 2022  1:55 PM  (Arrive by 1:40 PM)  RETURN NEUROLOGY with Idelia Salm, MD  [facility] NEUROLOGY CLINIC [address]  [facility] REGION) [address]  [place] [address]  [phone]             ______________________________________________________________________  Discharge Day Services:  BP 123/81  - Pulse 90  - Temp 36.8 ??C (98.2 ??F) (Oral)  - Resp 18  - Ht 167.5 cm (5' 5.95)  - Wt 83.2 kg (183 lb 6.8 oz)  - SpO2 94%  - BMI 29.65 kg/m??   Pt seen on the day of discharge and determined appropriate for discharge.    Condition at Discharge: good    Length of Discharge: I spent greater than 30 mins in the discharge of this patient.    Vena Rua. Oliva Bustard  Nurse Practitioner  Hematology/Oncology  [facility]    Group pager: [phone]

## 2022-03-25 NOTE — Unmapped (Signed)
Page to [facility] provider:     pt complaint of pelvic/right flank pain, relates to previous kidney stone pain, prn oxy given and she requested ice pack, she thinks it is irritated from lasix causing frequent urination today    Juanita Craver, RN

## 2022-03-25 NOTE — Unmapped (Signed)
Problem: Anemia (Chemotherapy Effects)  Goal: Anemia Symptom Improvement  Outcome: Ongoing - Unchanged  Intervention: Monitor and Manage Anemia  Oral Nutrition Promotion: calorie-dense foods provided  Safety Interventions:   chemotherapeutic agent precautions   fall reduction program maintained   lighting adjusted for tasks/safety   low bed   nonskid shoes/slippers when out of bed  Fatigue Management: activity schedule adjusted  Note: Pt is inpatient for C5 of HyperCVAD, VSS, afebrile, on room air, lungs clear and diminished, pt up ad lib to bathroom voiding, with dose of po lasix for weight gain and some nonpitting edema to BLE. Pt has hx of kidney/uretal stones and complaint of right abd/flank/pelvis pain, provider made aware and urine culture sent, prn oxycodone 5 mg given.  Doxorubicin 24h bag infusing well to Right double hub port without complications with brisk blood returns. Started on dulcolax yesterday, pt is passing gas.   Hgb today 7.7.  Pt tolerated cytoxan overnight, mesna completed. Safety maintained, call bell in reach,  see labs in [facility] and flowsheets for data.      Problem: Adult Inpatient Plan of Care  Goal: Plan of Care Review  Outcome: Ongoing - Unchanged  Goal: Absence of Hospital-Acquired Illness or Injury  Intervention: Identify and Manage Fall Risk  Safety Interventions:   chemotherapeutic agent precautions   fall reduction program maintained   lighting adjusted for tasks/safety   low bed   nonskid shoes/slippers when out of bed  Intervention: Prevent Skin Injury  Skin Protection: protective footwear used  Goal: Optimal Comfort and Wellbeing  Intervention: Monitor Pain and Promote Comfort  Pain Management Interventions: care clustered     Problem: Urinary Bleeding Risk or Actual (Chemotherapy Effects)  Goal: Absence of Hematuria  Outcome: Ongoing - Unchanged  Intervention: Prevent or Manage Hemorrhagic Cystitis  Pain Management Interventions: care clustered  Hyperhydration Management: fluids provided   encouraged to void  Note: UA sent daily, today's pending pt up to bathroom ad lib.      Problem: Nausea and Vomiting (Chemotherapy Effects)  Goal: Fluid and Electrolyte Balance  Outcome: Ongoing - Unchanged  Intervention: Prevent and Manage Nausea and Vomiting  Environmental Support: calm environment promoted  Oral Care:   teeth brushed   tongue brushed   lip/mouth moisturizer applied  Note: Regimen followed, antiemetics given as ordered, pt tolerating PO intake well, no complaints offered. (See EMAR and treatment plan in EPIC)

## 2022-03-26 MED ORDER — BISACODYL 5 MG TABLET,DELAYED RELEASE
ORAL_TABLET | Freq: Every day | ORAL | 0 refills | 30 days | Status: CP
Start: 2022-03-26 — End: 2022-04-25

## 2022-03-26 MED ORDER — DOCUSATE SODIUM 100 MG CAPSULE
ORAL_CAPSULE | Freq: Every day | ORAL | 0 refills | 30 days | Status: CP
Start: 2022-03-26 — End: 2022-04-25

## 2022-03-26 MED ADMIN — sodium chloride (NS) 0.9 % flush 10 mL: 10 mL | INTRAVENOUS | @ 01:00:00

## 2022-03-26 NOTE — Unmapped (Signed)
Patient alert oriented, afebrile, with chemo completed, port heparinized and de accessed thereafter, no complaints voiced and clarified and confirmed discharge instructions done by day team, belongings brought by patient and husband, discharged per wheelchair in good condition.

## 2022-03-26 NOTE — Unmapped (Signed)
Patient's VSS. 24 hour chemo will finish tonight and patient is discharged home, discharge papers are signed, night nurse will just have to de-access the [place]. Q4 blood return charted. Received one unit of prbc. Given compazine once prn.  Problem: Adult Inpatient Plan of Care  Goal: Plan of Care Review  Outcome: Progressing  Goal: Patient-Specific Goal (Individualized)  Outcome: Progressing  Goal: Absence of Hospital-Acquired Illness or Injury  Outcome: Progressing  Intervention: Identify and Manage Fall Risk  Recent Flowsheet Documentation  Taken 03/25/2022 1633 by Adrienne Mocha, RN  Safety Interventions:   lighting adjusted for tasks/safety   low bed  Taken 03/25/2022 1420 by Adrienne Mocha, RN  Safety Interventions:   lighting adjusted for tasks/safety   low bed  Taken 03/25/2022 1210 by Adrienne Mocha, RN  Safety Interventions:   lighting adjusted for tasks/safety   low bed  Taken 03/25/2022 1030 by Adrienne Mocha, RN  Safety Interventions:   low bed   lighting adjusted for tasks/safety  Taken 03/25/2022 0830 by Adrienne Mocha, RN  Safety Interventions:   lighting adjusted for tasks/safety   low bed  Intervention: Prevent and Manage VTE (Venous Thromboembolism) Risk  Recent Flowsheet Documentation  Taken 03/25/2022 1633 by Adrienne Mocha, RN  Activity Management: activity adjusted per tolerance  Taken 03/25/2022 1420 by Adrienne Mocha, RN  Activity Management: activity adjusted per tolerance  Taken 03/25/2022 1210 by Adrienne Mocha, RN  Activity Management: activity adjusted per tolerance  Taken 03/25/2022 1030 by Adrienne Mocha, RN  Activity Management: ambulated to bathroom  Taken 03/25/2022 0830 by Adrienne Mocha, RN  Activity Management: activity adjusted per tolerance  Intervention: Prevent Infection  Recent Flowsheet Documentation  Taken 03/25/2022 1633 by Adrienne Mocha, RN  Infection Prevention: cohorting utilized  Taken 03/25/2022 1420 by Adrienne Mocha, RN  Infection Prevention: cohorting utilized  Taken 03/25/2022 1210 by Adrienne Mocha, RN  Infection Prevention: cohorting utilized  Taken 03/25/2022 1030 by Adrienne Mocha, RN  Infection Prevention: cohorting utilized  Taken 03/25/2022 0830 by Adrienne Mocha, RN  Infection Prevention: cohorting utilized  Goal: Optimal Comfort and Wellbeing  Outcome: Progressing  Goal: Readiness for Transition of Care  Outcome: Progressing  Goal: Rounds/Family Conference  Outcome: Progressing     Problem: Anemia (Chemotherapy Effects)  Goal: Anemia Symptom Improvement  Outcome: Progressing  Intervention: Monitor and Manage Anemia  Recent Flowsheet Documentation  Taken 03/25/2022 1633 by Adrienne Mocha, RN  Safety Interventions:   lighting adjusted for tasks/safety   low bed  Taken 03/25/2022 1420 by Adrienne Mocha, RN  Safety Interventions:   lighting adjusted for tasks/safety   low bed  Taken 03/25/2022 1210 by Adrienne Mocha, RN  Safety Interventions:   lighting adjusted for tasks/safety   low bed  Taken 03/25/2022 1030 by Adrienne Mocha, RN  Safety Interventions:   low bed   lighting adjusted for tasks/safety  Taken 03/25/2022 0830 by Adrienne Mocha, RN  Safety Interventions:   lighting adjusted for tasks/safety   low bed     Problem: Urinary Bleeding Risk or Actual (Chemotherapy Effects)  Goal: Absence of Hematuria  Outcome: Progressing     Problem: Nausea and Vomiting (Chemotherapy Effects)  Goal: Fluid and Electrolyte Balance  Outcome: Progressing    No acute changes. Wctm.

## 2022-03-27 ENCOUNTER — Encounter
Admit: 2022-03-27 | Discharge: 2022-03-30 | Disposition: A | Discharge status: Home / Self Care | Payer: PRIVATE HEALTH INSURANCE | Admitting: Medical Oncology

## 2022-03-27 ENCOUNTER — Ambulatory Visit
Admit: 2022-03-27 | Discharge: 2022-03-30 | Disposition: A | Discharge status: Home / Self Care | Payer: PRIVATE HEALTH INSURANCE | Admitting: Medical Oncology

## 2022-03-27 ENCOUNTER — Encounter: Admit: 2022-03-27 | Discharge: 2022-03-30 | Payer: PRIVATE HEALTH INSURANCE | Attending: Hematology

## 2022-03-27 ENCOUNTER — Encounter
Admit: 2022-03-27 | Discharge: 2022-03-30 | Disposition: A | Discharge status: Home / Self Care | Payer: PRIVATE HEALTH INSURANCE | Attending: Hematology | Admitting: Medical Oncology

## 2022-03-27 LAB — CBC W/ AUTO DIFF
BASOPHILS ABSOLUTE COUNT: 0 10*9/L (ref 0.0–0.1)
BASOPHILS RELATIVE PERCENT: 0.4 %
EOSINOPHILS ABSOLUTE COUNT: 0 10*9/L (ref 0.0–0.5)
EOSINOPHILS RELATIVE PERCENT: 2.5 %
HEMATOCRIT: 25.2 % — ABNORMAL LOW (ref 34.0–44.0)
HEMOGLOBIN: 8.7 g/dL — ABNORMAL LOW (ref 11.3–14.9)
LYMPHOCYTES ABSOLUTE COUNT: 0.1 10*9/L — ABNORMAL LOW (ref 1.1–3.6)
LYMPHOCYTES RELATIVE PERCENT: 4.2 %
MEAN CORPUSCULAR HEMOGLOBIN CONC: 34.4 g/dL (ref 32.0–36.0)
MEAN CORPUSCULAR HEMOGLOBIN: 31.9 pg (ref 25.9–32.4)
MEAN CORPUSCULAR VOLUME: 92.8 fL (ref 77.6–95.7)
MEAN PLATELET VOLUME: 6.4 fL — ABNORMAL LOW (ref 6.8–10.7)
MONOCYTES ABSOLUTE COUNT: 0 10*9/L — ABNORMAL LOW (ref 0.3–0.8)
MONOCYTES RELATIVE PERCENT: 0.5 %
NEUTROPHILS ABSOLUTE COUNT: 1.7 10*9/L — ABNORMAL LOW (ref 1.8–7.8)
NEUTROPHILS RELATIVE PERCENT: 92.4 %
PLATELET COUNT: 95 10*9/L — ABNORMAL LOW (ref 150–450)
RED BLOOD CELL COUNT: 2.72 10*12/L — ABNORMAL LOW (ref 3.95–5.13)
RED CELL DISTRIBUTION WIDTH: 25.1 % — ABNORMAL HIGH (ref 12.2–15.2)
WBC ADJUSTED: 1.8 10*9/L — ABNORMAL LOW (ref 3.6–11.2)

## 2022-03-27 LAB — URINALYSIS WITH MICROSCOPY
BACTERIA: NONE SEEN /HPF
BILIRUBIN UA: NEGATIVE
BLOOD UA: NEGATIVE
GLUCOSE UA: NEGATIVE
KETONES UA: NEGATIVE
LEUKOCYTE ESTERASE UA: NEGATIVE
NITRITE UA: NEGATIVE
PH UA: 6.5 (ref 5.0–9.0)
PROTEIN UA: 50 — AB
RBC UA: 1 /HPF (ref <=4)
SPECIFIC GRAVITY UA: 1.021 (ref 1.003–1.030)
SQUAMOUS EPITHELIAL: <1 /HPF (ref 0–5)
UROBILINOGEN UA: 2 — AB
WBC UA: 3 /HPF (ref 0–5)

## 2022-03-27 LAB — COMPREHENSIVE METABOLIC PANEL
ALBUMIN: 3.5 g/dL (ref 3.4–5.0)
ALKALINE PHOSPHATASE: 66 U/L (ref 46–116)
ALT (SGPT): 86 U/L — ABNORMAL HIGH (ref 10–49)
ANION GAP: 10 mmol/L (ref 5–14)
AST (SGOT): 37 U/L — ABNORMAL HIGH (ref <=34)
BILIRUBIN TOTAL: 1 mg/dL (ref 0.3–1.2)
BLOOD UREA NITROGEN: 16 mg/dL (ref 9–23)
BUN / CREAT RATIO: 31
CALCIUM: 8.6 mg/dL — ABNORMAL LOW (ref 8.7–10.4)
CHLORIDE: 105 mmol/L (ref 98–107)
CO2: 25 mmol/L (ref 20.0–31.0)
CREATININE: 0.52 mg/dL — ABNORMAL LOW
EGFR CKD-EPI (2021) FEMALE: >90 mL/min/{1.73_m2} (ref >=60)
GLUCOSE RANDOM: 159 mg/dL (ref 70–179)
POTASSIUM: 2.8 mmol/L — ABNORMAL LOW (ref 3.4–4.8)
PROTEIN TOTAL: 6 g/dL (ref 5.7–8.2)
SODIUM: 140 mmol/L (ref 135–145)

## 2022-03-27 LAB — MAGNESIUM: MAGNESIUM: 2 mg/dL (ref 1.6–2.6)

## 2022-03-27 MED ADMIN — acetaminophen (TYLENOL) tablet 650 mg: 650 mg | ORAL | @ 20:00:00

## 2022-03-27 MED ADMIN — filgrastim-aafi (NIVESTYM) injection syringe 480 mcg: 480 ug | SUBCUTANEOUS | @ 22:00:00 | Stop: 2022-04-03

## 2022-03-27 MED ADMIN — potassium chloride 20 mEq in 100 mL IVPB Premix: 20 meq | INTRAVENOUS | @ 16:00:00 | Stop: 2022-03-27

## 2022-03-27 MED ADMIN — potassium chloride 20 mEq in 100 mL IVPB Premix: 20 meq | INTRAVENOUS | @ 18:00:00 | Stop: 2022-03-27

## 2022-03-27 MED ADMIN — sodium chloride 0.9% (NS) bolus 1,000 mL: 1000 mL | INTRAVENOUS | @ 15:00:00 | Stop: 2022-03-27

## 2022-03-27 MED ADMIN — cefepime (MAXIPIME) 2 g in sodium chloride (NS) 0.9 % 100 mL IVPB: 2 g | INTRAVENOUS | @ 15:00:00 | Stop: 2022-03-27

## 2022-03-27 MED ADMIN — potassium chloride 20 mEq in 100 mL IVPB Premix: 20 meq | INTRAVENOUS | @ 19:00:00 | Stop: 2022-03-27

## 2022-03-27 MED ADMIN — dasatinib (SPRYCEL) tablet 70 mg: 70 mg | ORAL | @ 22:00:00

## 2022-03-27 MED ADMIN — cetirizine (ZyrTEC) tablet 10 mg: 10 mg | ORAL | @ 18:00:00

## 2022-03-27 MED ADMIN — sodium chloride (NS) 0.9 % infusion: 125 mL/h | INTRAVENOUS | @ 18:00:00 | Stop: 2022-03-27

## 2022-03-27 MED ADMIN — sodium chloride (NS) 0.9 % flush 10 mL: 10 mL | INTRAVENOUS | @ 18:00:00

## 2022-03-27 NOTE — Unmapped (Unsigned)
Patient arrived to Bed 2 via w/c with spouse.  Dc'd late Friday night.  Pt states she had a fever of 103 and 101 yesterday and took tylenol because I knew I was coming here today.  States d/t shivering from fever, she had episodes of vomiting.  Also c/o uti symptoms - pain after urination and increased frequency.  APP notified and orders received. Post accessed without difficulty.

## 2022-03-27 NOTE — Unmapped (Signed)
Hematology/Oncology Brief Admission Communication Note    Patricia [name] is a 46 y.o. female with a diagnosis of ALL who is present in Infusion Clinic for transfusion support. She reports a two day h/o of fevers with t max 103. She has since been taking acetaminophen, last dose was this morning. She presents to Infusion with a temp of 99.7 and p 124. Her main concerns are increasing fatigue, pain and burning with urination, and increasing poor appetite. She reports normal BM and  Expected neutropenia with ANC today at 1.7, down from 5.5    HPI/Reason for admission: She is being admitted today due to neutropenic fever.     Plan for infusion center:   If the patient has an active treatment plan, chemotherapy to be administered: N/A  Infusions or supportive care ordered to be started in infusion: Labs (CBC w Diff, CMP, Mag, Blood cultures x2, UA, Urine culture), NS bolus and infusion, cefepime  Additional testing/orders to be carried out prior to admission: None    The preliminary plan and goals of admission are: Management of neutropenic fever    The primary outpatient oncologist for this patient is Dr. Saunders Glance, NP    Leeroy Cha, FNP

## 2022-03-27 NOTE — Unmapped (Deleted)
Port accessed with with 20g .75. CHG dressing applied.

## 2022-03-27 NOTE — Unmapped (Signed)
Hematology/Oncology     Attending Physician :  [name], MD  Accepting Service  : Oncology/Hematology (MDE)  Reason for Admission: immunocompromised fever    Problem List:   Patient Active Problem List   Diagnosis    Leukocytosis    B-cell acute lymphoblastic leukemia (ALL) (CMS-HCC)    Acute cough    Dyspepsia    Acute frontal sinusitis    Pancytopenia (CMS-HCC)    Febrile neutropenia (CMS-HCC)        Assessment/Plan: Patricia [name] is an 46 y.o. 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    Immunocompromised fever: Admitted from infusion for neutropenic fever. Patient reports she fevered soon after discharge with Tmax 103F. She has been taking Tylenol since this fever, roughly once per day, last dose 8/26. She reports that she did not come to hospital for fever because she had appointment for today. Patient reports burning/throbbing sensation with urination, otherwise no localizing symptoms. ANC 1.7 today, down from 5.5 on discharge. In infusion center, temp 37.9, tachy to 124, normotensive. 1L IVF given, pancultured and started on cefepime.   [ ]  Follow up urine/blood cultures  - cefepime 2g q8h  - Nivestym x 7 days (8/27 - )     Ph+ ALL, in remission: Initially diagnosed in 09/2021 and underwent [id number] induction complicated by PICC line associated superficial thrombus. BMBx on 5/2 revealed morphologic CR. C4 was complicated by admission for neutropenic fever with unclear source, though respiratory origin suspected given subtle LLL centrilobular groundglass opacities and patient presented with worsening cough. Discharged 7/28 after resolution of fevers off cefepime. Admitted for Cycle 5A HyperCVAD + Dasatinib, tolerated well, now readmitted with immunocompromised fever.  - C5D1 HyperCVAD (A cycle) = 03/21/22; C5D7 on admission   - Dasatinib 70 mg daily   - ppx Dapsone and Valtrex   - Nivestym x 7 days (8/27 - )      Migraines: Patient with longstanding history of recurrent unilateral head, neck, sinus pain in addition to postnasal drip and rhinorrhea.  Severe but dull pain that lasts for days, sensitive to light and sound.  Patient has received several courses of Augmentin with concern that this represented sinus infections; however CT sinus performed 7/20 revealed only minimal mucosal thickening of the right maxillary and right sphenoid sinuses.  ENT consult note from recent admission noted no signs of active infection.  Seen by neurology during recent admission who believed this was more consistent with migraine headaches.  They recommended conservative management and outpatient follow-up in neurology clinic.  - Magnesium, riboflavin supplementation  - Heating pads, neck stretches  [ ]  Ensure patient has neurology outpatient follow-up on discharge     Hemorrhoids - Anal fissure: Patient continues to have significant pain from hemorrhoids.  She has trialed Sitz baths, lidocaine-based topical creams, calcium channel blocker creams, hydrocortisone cream without improvement. Seen by lower GI surgery during recent admission with recommendations as below.  - Nifedipine/lidocaine cream for 6-8 weeks  - Lidocaine 4% cream PRN  - Colace TID, psyllium power supplementation   - 30mL mineral oil for constipation  - Sitz bath TID and after every BM  - Recommend avoiding Dr Charm Barges cream     Grade 1 peripheral neuropathy: Paraesthesias present primarily in her finger pads, currently without medical interventions continue to monitor while on therapy. Not affecting ADLs.     Anxiety: Reports taking Lexapro 10 mg at  home, recently restarted.  - Continue Lexapro 10mg  daily     Immunocompromised status: Patient is immunocompromised secondary to chemotherapy  - Antimicrobial prophylaxis as above     Impending Electrolyte Abnormality Secondary to Chemotherapy and/or IV Fluids  - Daily Electrolyte monitoring  - Replete per [facility] guidelines.     Pancytopenia  - Transfuse 1 unit of pRBCs for hgb >7  - Transfuse 1 unit plt for plts >10K     HPI: Patricia [name] is an 46 y.o. female who presented to infusion center with fevers.   Reports a fever to 103F the night she discharged from the hospital. Did not report to ED because she wanted to wait until infusion appt today. No recorded fever at home since then but has been taking Tylenol, roughly one dose per day. Temp to 38C shortly after arriving on [facility]ON[facility]. Reports dysuria and increased urinary frequency which has been ongoing for several days.    Denies new headaches, vision changes, blood in urine/stool. No cough, SOB, CP, sinus pressure, rashes, n/v/d. Last BM 2 days ago, patient prefers to try to encourage BM via natural means (apple juice).     Review of Systems: All positive and pertinent negatives are noted in the HPI; otherwise all other systems are negative    Oncologic History:   Primary Oncologist: Dr. Senaida Ores  Oncology History Overview Note   Referring/Local Oncologist:    Diagnosis:   10/29/2021  Bone marrow, left iliac, aspiration and biopsy  -  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 The majority of the abnormal cells (64/88) contained an additional BCR/ABL1 fusion signal, while 9/88 abnormal cells contained an additional ABL1 and ASS1 signal.       Genetics:   Karyotype/FISH:   RESULTS   Date Value Ref Range Status   10/28/2021   Final    NOTE: This report reflects a combined study from a peripheral blood and a bone marrow core biopsy. Eleven cells from the peripheral blood and nine cells from the bone marrow core biopsy were analyzed. The BCR/ABL1 FISH analysis was performed on the peripheral blood.     Abnormal Karyotype:  47,XX,t(9;22)(q34;q11.2),+der(22)t(9;22)[18]/46,XX[2]    Abnormal FISH:  A BCR/ABL1 interphase FISH assay showed a signal pattern consistent with a BCR::ABL1 rearrangement and the 9;22 translocation in 88% of the 100 cells scored. The majority of the abnormal cells (64/88) contained an additional BCR/ABL1 fusion signal, while 9/88 abnormal cells contained an additional ABL1 and ASS1 signal.           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  MRD by flow insufficient, p190 2/100,000  12/10/2021: Cycle 2 B cycle + rituximab  12/14/21: IT#4  12/17/21: IT#5  01/10/22: Cycle 3 A cycle + rituximab  01/24/22: Bmbx - MRD-neg by PCR (p190)   ~02/09/22: PLAN C4  B-cell acute lymphoblastic leukemia (ALL) (CMS-HCC)   10/29/2021 Initial Diagnosis    B-cell acute lymphoblastic leukemia (ALL) (CMS-HCC)     10/30/2021 -  Chemotherapy    IP/OP LEUKEMIA GRAAPH-2005 < 60 YO (OP PEGFILGRASTIM ON DAY 7)  [No description for this plan]     01/06/2022 Endocrine/Hormone Therapy    OP LEUPROLIDE (LUPRON) 11.25 MG EVERY 3 MONTHS  Plan Provider: [name], MD         Medical History:  PCP: [name], MD  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    Surgical History:  Past Surgical History:   Procedure Laterality Date    IR INSERT PORT AGE GREATER THAN 5 YRS  12/08/2021    IR INSERT PORT AGE GREATER THAN 5 YRS 12/08/2021 [name], PA IMG [facility]IR [facility]      Social History:  Social History     Socioeconomic History    Marital status: Married   Tobacco Use    Smoking status: Never    Smokeless tobacco: Never     Social Determinants of Health     Financial Resource Strain: Low Risk  (11/02/2021)    Overall Financial Resource Strain (CARDIA)     Difficulty of Paying Living Expenses: Not hard at all   Food Insecurity: No Food Insecurity (11/02/2021)    Hunger Vital Sign     Worried About Running Out of Food in the Last Year: Never true     Ran Out of Food in the Last Year: Never true   Transportation Needs: No Transportation Needs (11/02/2021)    PRAPARE - Therapist, art (Medical): No     Lack of Transportation (Non-Medical): No      Family History:   family history is not on file. No hx blood cancers.     Allergies: is allergic to erythromycin, other, sulfa (sulfonamide antibiotics), and azithromycin.    Medications:   Meds:   [START ON 03/28/2022] bisacodyL  5 mg Oral Daily    Cefepime  2 g Intravenous Q8H SCH    cetirizine  10 mg Oral Q AM    [START ON 03/28/2022] dapsone  100 mg Oral Daily    dasatinib  70 mg Oral Daily    [START ON 03/28/2022] fluconazole  200 mg Oral Daily    sodium chloride  10 mL Intravenous BID    sodium chloride  10 mL Intravenous BID    [START ON 03/28/2022] valACYclovir  500 mg Oral Daily     Continuous Infusions:   IP okay to treat      sodium chloride 125 mL/hr (03/27/22 1356)    sodium chloride       PRN Meds:.emollient combination no.92, IP okay to treat, levalbuterol, loperamide, loperamide    Objective:   Vitals: Temp:  [37.7 ??C (99.8 ??F)-37.9 ??C (100.2 ??F)] 37.9 ??C (100.2 ??F)  Heart Rate:  [102-124] 108  Resp:  [18] 18  BP: (132-134)/(88-99) 133/99  MAP (mmHg):  [111] 111  SpO2:  [96 %] 96 %    Physical Exam:  General: Resting, in no apparent distress, lying in bed  HEENT:  PERRL. No scleral icterus or conjunctival injection. MMM without ulceration, erythema or exudate. No cervical or axillary lymphadenopathy.   Heart:  RRR. S1, S2. No murmurs, gallops, or rubs.  Lungs:  Breathing is unlabored, and patient is speaking full sentences with ease.  No stridor.  CTAB. No rales, ronchi, or crackles.    Abdomen:  No distention or pain on palpation.  Bowel sounds are present and normoactive x 4.  No palpable hepatomegaly or splenomegaly.  No palpable masses.  Skin:  No rashes, petechiae or purpura.  No areas of skin breakdown. Warm to touch, dry, smooth, and even.  Musculoskeletal:  No grossly-evident joint effusions or deformities.  Range of motion about the shoulder, elbow, hips and knees is grossly normal.  Psychiatric:  Range of affect is appropriate.    Neurologic:  Alert and oriented to person, place, time and situation.  Gait is normal.  No Nystagmus Cerebellar tasks (finger-to-nose, rapid hand movement, heel along shin) are completed with ease and are symmetric. CNII-CNXII grossly intact.  Extremities:  Appear well-perfused. No clubbing, edema, or cyanosis.  CVAD: R CW Port - no erythema, nontender; dressing CDI.    Test Results  Recent Labs     03/25/22  0037 03/27/22  0815   WBC 5.8 1.8*   NEUTROABS 5.5 1.7*   HGB 7.7* 8.7*   PLT 173 95*     Recent Labs     03/25/22  0037 03/27/22  0842   NA 144 140   K 3.8 2.8*   CL 111* 105   CO2 24.0 25.0   BUN 18 16   CREATININE 0.64 0.52*   MG 2.0 2.0   CALCIUM 8.3* 8.6*     DVT PPX Indicated: no,  ambulatory    Need for PT: yes  Anticipated Discharge: Their home    Code Status: Full code     I personally spent 60 minutes face-to-face and non-face-to-face in the care of this patient, which includes all pre, intra, and post visit time on the date of service.  All documented time was specific to the E/M visit and does not include any procedures that may have been performed.    Thornell Mule. Janeth Rase  Hematology/Oncology Department  March 27, 2022 6:31 PM

## 2022-03-28 LAB — CBC W/ AUTO DIFF
BASOPHILS ABSOLUTE COUNT: 0 10*9/L (ref 0.0–0.1)
BASOPHILS RELATIVE PERCENT: 0.3 %
EOSINOPHILS ABSOLUTE COUNT: 0.1 10*9/L (ref 0.0–0.5)
EOSINOPHILS RELATIVE PERCENT: 3 %
HEMATOCRIT: 23.1 % — ABNORMAL LOW (ref 34.0–44.0)
HEMOGLOBIN: 8.1 g/dL — ABNORMAL LOW (ref 11.3–14.9)
LYMPHOCYTES ABSOLUTE COUNT: 0.1 10*9/L — ABNORMAL LOW (ref 1.1–3.6)
LYMPHOCYTES RELATIVE PERCENT: 3.9 %
MEAN CORPUSCULAR HEMOGLOBIN CONC: 34.9 g/dL (ref 32.0–36.0)
MEAN CORPUSCULAR HEMOGLOBIN: 32.5 pg — ABNORMAL HIGH (ref 25.9–32.4)
MEAN CORPUSCULAR VOLUME: 93.2 fL (ref 77.6–95.7)
MEAN PLATELET VOLUME: 6.6 fL — ABNORMAL LOW (ref 6.8–10.7)
MONOCYTES ABSOLUTE COUNT: 0 10*9/L — ABNORMAL LOW (ref 0.3–0.8)
MONOCYTES RELATIVE PERCENT: 0.4 %
NEUTROPHILS ABSOLUTE COUNT: 2.5 10*9/L (ref 1.8–7.8)
NEUTROPHILS RELATIVE PERCENT: 92.4 %
PLATELET COUNT: 68 10*9/L — ABNORMAL LOW (ref 150–450)
RED BLOOD CELL COUNT: 2.48 10*12/L — ABNORMAL LOW (ref 3.95–5.13)
RED CELL DISTRIBUTION WIDTH: 24.6 % — ABNORMAL HIGH (ref 12.2–15.2)
WBC ADJUSTED: 2.7 10*9/L — ABNORMAL LOW (ref 3.6–11.2)

## 2022-03-28 LAB — BASIC METABOLIC PANEL
ANION GAP: 7 mmol/L (ref 5–14)
BLOOD UREA NITROGEN: 12 mg/dL (ref 9–23)
BUN / CREAT RATIO: 24
CALCIUM: 8.5 mg/dL — ABNORMAL LOW (ref 8.7–10.4)
CHLORIDE: 110 mmol/L — ABNORMAL HIGH (ref 98–107)
CO2: 23 mmol/L (ref 20.0–31.0)
CREATININE: 0.51 mg/dL — ABNORMAL LOW
EGFR CKD-EPI (2021) FEMALE: >90 mL/min/{1.73_m2} (ref >=60)
GLUCOSE RANDOM: 84 mg/dL (ref 70–179)
POTASSIUM: 3.7 mmol/L (ref 3.4–4.8)
SODIUM: 140 mmol/L (ref 135–145)

## 2022-03-28 LAB — HEPATIC FUNCTION PANEL
ALBUMIN: 3.4 g/dL (ref 3.4–5.0)
ALKALINE PHOSPHATASE: 62 U/L (ref 46–116)
ALT (SGPT): 82 U/L — ABNORMAL HIGH (ref 10–49)
AST (SGOT): 36 U/L — ABNORMAL HIGH (ref <=34)
BILIRUBIN DIRECT: 0.3 mg/dL (ref 0.00–0.30)
BILIRUBIN TOTAL: 1.1 mg/dL (ref 0.3–1.2)
PROTEIN TOTAL: 5.7 g/dL (ref 5.7–8.2)

## 2022-03-28 LAB — PHOSPHORUS: PHOSPHORUS: 2.7 mg/dL (ref 2.4–5.1)

## 2022-03-28 LAB — MAGNESIUM: MAGNESIUM: 2 mg/dL (ref 1.6–2.6)

## 2022-03-28 MED ADMIN — valACYclovir (VALTREX) tablet 500 mg: 500 mg | ORAL | @ 22:00:00

## 2022-03-28 MED ADMIN — filgrastim-aafi (NIVESTYM) injection syringe 480 mcg: 480 ug | SUBCUTANEOUS | @ 22:00:00 | Stop: 2022-04-03

## 2022-03-28 MED ADMIN — sodium chloride (NS) 0.9 % flush 10 mL: 10 mL | INTRAVENOUS | @ 13:00:00 | Stop: 2022-03-28

## 2022-03-28 MED ADMIN — dasatinib (SPRYCEL) tablet 70 mg: 70 mg | ORAL | @ 22:00:00

## 2022-03-28 MED ADMIN — diphenhydrAMINE (BENADRYL) capsule/tablet 25 mg: 25 mg | ORAL | @ 19:00:00 | Stop: 2022-03-28

## 2022-03-28 MED ADMIN — cefepime (MAXIPIME) 2 g in sodium chloride 0.9 % (NS) 100 mL IVPB-MBP: 2 g | INTRAVENOUS | @ 18:00:00 | Stop: 2022-04-03

## 2022-03-28 MED ADMIN — cefepime (MAXIPIME) 2 g in sodium chloride 0.9 % (NS) 100 mL IVPB-MBP: 2 g | INTRAVENOUS | @ 11:00:00 | Stop: 2022-04-03

## 2022-03-28 MED ADMIN — cetirizine (ZyrTEC) tablet 10 mg: 10 mg | ORAL | @ 13:00:00

## 2022-03-28 MED ADMIN — dapsone tablet 100 mg: 100 mg | ORAL | @ 13:00:00

## 2022-03-28 MED ADMIN — cefepime (MAXIPIME) 2 g in sodium chloride 0.9 % (NS) 100 mL IVPB-MBP: 2 g | INTRAVENOUS | @ 01:00:00 | Stop: 2022-04-03

## 2022-03-28 MED ADMIN — riTUXimab-abbs (TRUXIMA) 731 mg in sodium chloride (NS) 0.9 % 250 mL rapid infusion: 375 mg/m2 | INTRAVENOUS | @ 21:00:00 | Stop: 2022-03-28

## 2022-03-28 MED ADMIN — acetaminophen (TYLENOL) tablet 650 mg: 650 mg | ORAL | @ 19:00:00 | Stop: 2022-03-28

## 2022-03-28 MED ADMIN — sodium chloride (NS) 0.9 % flush 10 mL: 10 mL | INTRAVENOUS | @ 01:00:00

## 2022-03-28 MED ADMIN — acetaminophen (TYLENOL) tablet 650 mg: 650 mg | ORAL | @ 08:00:00

## 2022-03-28 MED ADMIN — lidocaine (XYLOCAINE) 2% viscous mucosal solution: 15 mL | OROMUCOSAL | @ 18:00:00

## 2022-03-28 NOTE — Unmapped (Signed)
Pt arrived to unit from infusion clinic. Tmax 38.0 this evening -  MD notified, tylenol given - see MAR. Afebrile on 1hr recheck. Family at bedside.    Problem: Adult Inpatient Plan of Care  Goal: Plan of Care Review  Outcome: Ongoing - Unchanged  Goal: Patient-Specific Goal (Individualized)  Outcome: Ongoing - Unchanged  Goal: Absence of Hospital-Acquired Illness or Injury  Outcome: Ongoing - Unchanged  Intervention: Identify and Manage Fall Risk  Recent Flowsheet Documentation  Taken 03/27/2022 1338 by Ronie Spies, RN  Safety Interventions:   chemotherapeutic agent precautions   family at bedside   infection management   lighting adjusted for tasks/safety   low bed   nonskid shoes/slippers when out of bed  Intervention: Prevent and Manage VTE (Venous Thromboembolism) Risk  Recent Flowsheet Documentation  Taken 03/27/2022 1338 by Ronie Spies, RN  Activity Management: activity encouraged  Intervention: Prevent Infection  Recent Flowsheet Documentation  Taken 03/27/2022 1338 by Ronie Spies, RN  Infection Prevention: hand hygiene promoted  Goal: Optimal Comfort and Wellbeing  Outcome: Ongoing - Unchanged  Goal: Readiness for Transition of Care  Outcome: Ongoing - Unchanged  Goal: Rounds/Family Conference  Outcome: Ongoing - Unchanged

## 2022-03-28 NOTE — Unmapped (Signed)
Daily Progress Note      Active Problems:    B-cell acute lymphoblastic leukemia (ALL) (CMS-HCC)       LOS: 1 day     Assessment/Plan:Patricia [name] is an 46 y.o. 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     Today's Plan: D8 C5 HyperCVAD=8/28. Patient will received Rituximab today as it was not given in the infusion clinci on Day 7 due to fever.  Patient has not had recurrent fever since 8/27 @ 1600. Complete Respiratory Pathogen Panel ordered due to ongoing sinus drainage and recent fever, although sinus drainage is chronic. Blood culture and urine culture remain no growth to date. Continue cefepime until cultures are clear X 72 hrs.  Continue Nivestym.    Immunocompromised fever: Admitted from infusion for neutropenic fever. Patient reports she fevered soon after discharge with Tmax 103F. She has been taking Tylenol since this fever, roughly once per day, last dose 8/26. She reports that she did not come to hospital for fever because she had appointment for today. Patient reports burning/throbbing sensation with urination, otherwise no localizing symptoms. ANC 1.7 today, down from 5.5 on discharge. In infusion center, temp 37.9, tachy to 124, normotensive. 1L IVF given, pancultured and started on cefepime.   [ ]  Urine and Blood cultures NG @ 24 hrs, continue to follow until finalized  [ ]  RPP pending, COVID negative on admission  - cefepime 2g q8h plan for 72hrs unless repeat fevers or infectious etiology is determined  - Nivestym x 7 days (8/27 - )     Ph+ ALL, in remission: Initially diagnosed in 09/2021 and underwent [id number] induction complicated by PICC line associated superficial thrombus. BMBx on 5/2 revealed morphologic CR. C4 was complicated by admission for neutropenic fever with unclear source, though respiratory origin suspected given subtle LLL centrilobular groundglass opacities and patient presented with worsening cough. Discharged 7/28 after resolution of fevers off cefepime. Admitted for Cycle 5A HyperCVAD + Dasatinib, tolerated well, now readmitted with immunocompromised fever.  - C5D1 HyperCVAD (A cycle) = 03/21/22; C5D7 on admission   - Dasatinib 70 mg daily   - ppx Dapsone and Valtrex   - Nivestym x 7 days (8/27 - )      Migraines: Patient with longstanding history of recurrent unilateral head, neck, sinus pain in addition to postnasal drip and rhinorrhea.  Severe but dull pain that lasts for days, sensitive to light and sound.  Patient has received several courses of Augmentin with concern that this represented sinus infections; however CT sinus performed 7/20 revealed only minimal mucosal thickening of the right maxillary and right sphenoid sinuses.  ENT consult note from recent admission noted no signs of active infection.  Seen by neurology during recent admission who believed this was more consistent with migraine headaches.  They recommended conservative management and outpatient follow-up in neurology clinic.  - Magnesium, riboflavin supplementation  - Heating pads, neck stretches  [ ]  Ensure patient has neurology outpatient follow-up on discharge     Hemorrhoids - Anal fissure: Patient continues to have significant pain from hemorrhoids.  She has trialed Sitz baths, lidocaine-based topical creams, calcium channel blocker creams, hydrocortisone cream without improvement. Seen by lower GI surgery during recent admission with recommendations as below.  - Lidocaine 4% cream PRN  - Now with soft daily bowel movements taking prune and apple juice daily.     Grade 1 peripheral neuropathy: Paraesthesias  present primarily in her finger pads, currently without medical interventions continue to monitor while on therapy. Not affecting ADLs.     Anxiety: Reports taking Lexapro 10 mg at home, recently restarted.  - Continue Lexapro 10mg  daily     Immunocompromised status: Patient is immunocompromised secondary to chemotherapy  - Antimicrobial prophylaxis as above     Impending Electrolyte Abnormality Secondary to Chemotherapy and/or IV Fluids  - Daily Electrolyte monitoring  - Replete per [facility] guidelines.      Pancytopenia  - Transfuse 1 unit of pRBCs for hgb >7  - Transfuse 1 unit plt for plts >10K     Nutrition:                        Subjective:     Interval History: Patient report feeling generally well this morning.  Nor ruther reports of fevers, chill, rigors or other infectious symptoms.  Patient endorses ongoing sinus drainage although not worse than chronic drainage.  Sputum production is tannish in color.    10 point ROS otherwise negative except as above in the HPI.     Objective:     Vital signs in last 24 hours:  Temp:  [36.7 ??C (98.1 ??F)-38 ??C (100.4 ??F)] 36.9 ??C (98.4 ??F)  Heart Rate:  [91-108] 102  Resp:  [18] 18  BP: (114-133)/(83-99) 127/91  MAP (mmHg):  [93-111] 100  SpO2:  [94 %-97 %] 97 %    Intake/Output last 3 shifts:  I/O last 3 completed shifts:  In: 10 [I.V.:10]  Out: -     Meds:  Current Facility-Administered Medications   Medication Dose Route Frequency Provider Last Rate Last Admin    acetaminophen (TYLENOL) tablet 650 mg  650 mg Oral Q6H PRN Lu Duffel, PA   650 mg at 03/28/22 0400    acetaminophen (TYLENOL) tablet 650 mg  650 mg Oral Once [name], MD        bisacodyL (DULCOLAX) EC tablet 5 mg  5 mg Oral Daily Lu Duffel, PA        cefepime (MAXIPIME) 2 g in sodium chloride 0.9 % (NS) 100 mL IVPB-MBP  2 g Intravenous [facility] [name], MD   Stopped at 03/28/22 0815    cetirizine (ZyrTEC) tablet 10 mg  10 mg Oral Q AM Lu Duffel, [place]   10 mg at 03/28/22 0919    dapsone tablet 100 mg  100 mg Oral Daily Estanislado Emms [place], [place]   100 mg at 03/28/22 1610    dasatinib (SPRYCEL) tablet 70 mg  70 mg Oral Daily [name], MD        dexamethasone (DECADRON) 4 mg/mL injection 20 mg  20 mg Intravenous Once PRN [name], MD        diphenhydrAMINE (BENADRYL) capsule/tablet 25 mg  25 mg Oral Once [name], MD        diphenhydrAMINE (BENADRYL) injection 25 mg  25 mg Intravenous Once [name], MD        diphenhydrAMINE (BENADRYL) injection 25 mg  25 mg Intravenous Once PRN [name], MD        emollient combination no.92 (LUBRIDERM) lotion 1 application.  1 application. Topical Q1H PRN Lu Duffel, PA        EPINEPHrine [facility]) injection 0.3 mg  0.3 mg Intramuscular Once PRN [name], MD        famotidine (PF) (PEPCID)  injection 20 mg  20 mg Intravenous Once PRN [name], MD        filgrastim-aafi (NIVESTYM) injection syringe 480 mcg  480 mcg Subcutaneous Daily Lianny Ollar [place], [place]   480 mcg at 03/27/22 1817    fluconazole (DIFLUCAN) tablet 200 mg  200 mg Oral Daily Lu Duffel, PA        fluticasone propionate (FLONASE) 50 mcg/actuation nasal spray 2 spray  2 spray Each Nare Daily PRN Janyth Contes, FNP        IP OKAY TO TREAT   Other Continuous PRN Lu Duffel, PA        IP OKAY TO TREAT   Other Continuous PRN Herbert Moors, AGNP        levalbuterol [facility]) nebulizer solution 0.31 mg  1 ampule Nebulization Q4H PRN Lu Duffel, PA        lidocaine (XYLOCAINE) 2% viscous mucosal solution  15 mL Mouth Q4H PRN Herbert Moors, AGNP        loperamide (IMODIUM) capsule 2 mg  2 mg Oral Q2H PRN Lu Duffel, PA        loperamide (IMODIUM) capsule 4 mg  4 mg Oral Once PRN Lu Duffel, PA        meperidine (DEMEROL) injection 25 mg  25 mg Intravenous Once PRN [name], MD        methylPREDNISolone sodium succinate (PF) (SOLU-medrol) injection 125 mg  125 mg Intravenous Once PRN [name], MD        OKAY TO SEND MEDICATION/CHEMOTHERAPY TO OUTPATIENT UNIT   Other Once [name], MD        riTUXimab-abbs (TRUXIMA) 731 mg in sodium chloride (NS) 0.9 % 250 mL rapid infusion  375 mg/m2 (Treatment Plan Recorded) Intravenous Once [name], MD        sodium chloride (NS) 0.9 % flush 10 mL  10 mL Intravenous BID Lu Duffel, PA   10 mL at 03/27/22 1348    sodium chloride (NS) 0.9 % flush 10 mL  10 mL Intravenous BID Lu Duffel, PA   10 mL at 03/28/22 0919    sodium chloride (NS) 0.9 % infusion  20 mL/hr Intravenous Continuous Lu Duffel, PA        sodium chloride (NS) 0.9 % infusion  20 mL/hr Intravenous Continuous PRN [name], MD        sodium chloride 0.9% (NS) bolus 1,000 mL  1,000 mL Intravenous Once PRN [name], MD        valACYclovir (VALTREX) tablet 500 mg  500 mg Oral Daily Lu Duffel, [place]           Physical Exam:  General: Resting, in no apparent distress, lying in bed  HEENT:  PERRL. No scleral icterus or conjunctival injection. MMM without ulceration, erythema or exudate. No cervical or axillary lymphadenopathy.   Heart:  RRR. S1, S2. No murmurs, gallops, or rubs.  Lungs:  Breathing is unlabored, and patient is speaking full sentences with ease.  No stridor.  CTAB. No rales, ronchi, or crackles.    Abdomen:  No distention or pain on palpation.  Bowel sounds are present and normoactive x 4.  No palpable hepatomegaly or splenomegaly.  No palpable masses.  Skin:  No rashes, petechiae or purpura.  No areas of skin breakdown. Warm to touch, dry, smooth, and even.  Musculoskeletal:  No grossly-evident joint effusions or  deformities.  Range of motion about the shoulder, elbow, hips and knees is grossly normal.  Psychiatric:  Range of affect is appropriate.    Neurologic:  Alert and oriented to person, place, time and situation.  Gait is normal.  No Nystagmus Cerebellar tasks (finger-to-nose, rapid hand movement, heel along shin) are completed with ease and are symmetric. CNII-CNXII grossly intact.  Extremities:  Appear well-perfused. No clubbing, edema, or cyanosis.  CVAD: R CW Port - no erythema, nontender; dressing CDI.      Labs:  Recent Labs     03/27/22  0815 03/27/22  0842 03/28/22  0403   WBC 1.8*  --  2.7*   NEUTROABS 1.7*  --  2.5   LYMPHSABS 0.1*  --  0.1*   HGB 8.7*  --  8.1*   HCT 25.2*  --  23.1*   PLT 95*  --  68*   CREATININE  --  0.52* 0.51*   BUN  --  16 12   BILITOT  --  1.0 1.1   BILIDIR  --   --  0.30   AST  --  37* 36*   ALT  --  86* 82*   ALKPHOS  --  66 62   K  --  2.8* 3.7   MG  --  2.0 2.0   CALCIUM  --  8.6* 8.5*   NA  --  140 140   CL  --  105 110*   CO2  --  25.0 23.0   PHOS  --   --  2.7       Imaging:  No new.    Herbert Moors, [place]  03/28/2022  11:45 AM   Hematology/Oncology Department   [facility]   Group pager: [phone]

## 2022-03-29 LAB — CBC W/ AUTO DIFF
BASOPHILS ABSOLUTE COUNT: 0 10*9/L (ref 0.0–0.1)
BASOPHILS RELATIVE PERCENT: 0.6 %
EOSINOPHILS ABSOLUTE COUNT: 0.1 10*9/L (ref 0.0–0.5)
EOSINOPHILS RELATIVE PERCENT: 6.2 %
HEMATOCRIT: 23.3 % — ABNORMAL LOW (ref 34.0–44.0)
HEMOGLOBIN: 8 g/dL — ABNORMAL LOW (ref 11.3–14.9)
LYMPHOCYTES ABSOLUTE COUNT: 0.2 10*9/L — ABNORMAL LOW (ref 1.1–3.6)
LYMPHOCYTES RELATIVE PERCENT: 20.7 %
MEAN CORPUSCULAR HEMOGLOBIN CONC: 34.5 g/dL (ref 32.0–36.0)
MEAN CORPUSCULAR HEMOGLOBIN: 32.5 pg — ABNORMAL HIGH (ref 25.9–32.4)
MEAN CORPUSCULAR VOLUME: 94 fL (ref 77.6–95.7)
MEAN PLATELET VOLUME: 6.2 fL — ABNORMAL LOW (ref 6.8–10.7)
MONOCYTES ABSOLUTE COUNT: 0 10*9/L — ABNORMAL LOW (ref 0.3–0.8)
MONOCYTES RELATIVE PERCENT: 1.6 %
NEUTROPHILS ABSOLUTE COUNT: 0.7 10*9/L — ABNORMAL LOW (ref 1.8–7.8)
NEUTROPHILS RELATIVE PERCENT: 70.9 %
PLATELET COUNT: 21 10*9/L — ABNORMAL LOW (ref 150–450)
RED BLOOD CELL COUNT: 2.48 10*12/L — ABNORMAL LOW (ref 3.95–5.13)
RED CELL DISTRIBUTION WIDTH: 24.7 % — ABNORMAL HIGH (ref 12.2–15.2)
WBC ADJUSTED: 1 10*9/L — ABNORMAL LOW (ref 3.6–11.2)

## 2022-03-29 LAB — SLIDE REVIEW

## 2022-03-29 LAB — BASIC METABOLIC PANEL
ANION GAP: 10 mmol/L (ref 5–14)
BLOOD UREA NITROGEN: 17 mg/dL (ref 9–23)
BUN / CREAT RATIO: 35
CALCIUM: 8.6 mg/dL — ABNORMAL LOW (ref 8.7–10.4)
CHLORIDE: 107 mmol/L (ref 98–107)
CO2: 22 mmol/L (ref 20.0–31.0)
CREATININE: 0.49 mg/dL — ABNORMAL LOW
EGFR CKD-EPI (2021) FEMALE: >90 mL/min/{1.73_m2} (ref >=60)
GLUCOSE RANDOM: 168 mg/dL (ref 70–179)
POTASSIUM: 3.5 mmol/L (ref 3.4–4.8)
SODIUM: 139 mmol/L (ref 135–145)

## 2022-03-29 LAB — PHOSPHORUS: PHOSPHORUS: 2.6 mg/dL (ref 2.4–5.1)

## 2022-03-29 LAB — MAGNESIUM: MAGNESIUM: 1.8 mg/dL (ref 1.6–2.6)

## 2022-03-29 MED ADMIN — dasatinib (SPRYCEL) tablet 70 mg: 70 mg | ORAL | @ 23:00:00

## 2022-03-29 MED ADMIN — acetaminophen (TYLENOL) tablet 650 mg: 650 mg | ORAL | @ 05:00:00

## 2022-03-29 MED ADMIN — dapsone tablet 100 mg: 100 mg | ORAL | @ 13:00:00

## 2022-03-29 MED ADMIN — valACYclovir (VALTREX) tablet 500 mg: 500 mg | ORAL | @ 23:00:00

## 2022-03-29 MED ADMIN — levoFLOXacin (LEVAQUIN) tablet 500 mg: 500 mg | ORAL | @ 14:00:00 | Stop: 2022-04-28

## 2022-03-29 MED ADMIN — sodium chloride (NS) 0.9 % flush 10 mL: 10 mL | INTRAVENOUS | @ 02:00:00

## 2022-03-29 MED ADMIN — cefepime (MAXIPIME) 2 g in sodium chloride 0.9 % (NS) 100 mL IVPB-MBP: 2 g | INTRAVENOUS | @ 02:00:00 | Stop: 2022-04-03

## 2022-03-29 MED ADMIN — cefepime (MAXIPIME) 2 g in sodium chloride 0.9 % (NS) 100 mL IVPB-MBP: 2 g | INTRAVENOUS | @ 10:00:00 | Stop: 2022-03-29

## 2022-03-29 MED ADMIN — cetirizine (ZyrTEC) tablet 10 mg: 10 mg | ORAL | @ 13:00:00

## 2022-03-29 MED ADMIN — sodium chloride (NS) 0.9 % flush 10 mL: 10 mL | INTRAVENOUS | @ 13:00:00

## 2022-03-29 MED ADMIN — filgrastim-aafi (NIVESTYM) injection syringe 480 mcg: 480 ug | SUBCUTANEOUS | @ 23:00:00 | Stop: 2022-04-03

## 2022-03-29 MED ADMIN — fluconazole (DIFLUCAN) tablet 200 mg: 200 mg | ORAL | @ 13:00:00

## 2022-03-29 NOTE — Unmapped (Addendum)
Pt's A&Ox4, VSS, afebrile, denies dizziness, no pain, no N/V/D, got levaquin, got oral chemo, no acute changes at this time, possible discharge tomorrow.          Problem: Adult Inpatient Plan of Care  Goal: Plan of Care Review  Outcome: Ongoing - Unchanged  Goal: Patient-Specific Goal (Individualized)  Outcome: Ongoing - Unchanged  Goal: Absence of Hospital-Acquired Illness or Injury  Outcome: Ongoing - Unchanged  Intervention: Identify and Manage Fall Risk  Recent Flowsheet Documentation  Taken 03/29/2022 0810 by Sharlee Blew, RN  Safety Interventions:   bleeding precautions   fall reduction program maintained   lighting adjusted for tasks/safety   low bed   nonskid shoes/slippers when out of bed  Intervention: Prevent Skin Injury  Recent Flowsheet Documentation  Taken 03/29/2022 0810 by Sharlee Blew, RN  Skin Protection: adhesive use limited  Intervention: Prevent and Manage VTE (Venous Thromboembolism) Risk  Recent Flowsheet Documentation  Taken 03/29/2022 0810 by Sharlee Blew, RN  Activity Management: activity adjusted per tolerance  VTE Prevention/Management: ambulation promoted  Intervention: Prevent Infection  Recent Flowsheet Documentation  Taken 03/29/2022 0810 by Sharlee Blew, RN  Infection Prevention: cohorting utilized  Goal: Optimal Comfort and Wellbeing  Outcome: Ongoing - Unchanged  Goal: Readiness for Transition of Care  Outcome: Ongoing - Unchanged  Goal: Rounds/Family Conference  Outcome: Ongoing - Unchanged     Problem: Fever (Fever with Neutropenia)  Goal: Baseline Body Temperature  Outcome: Ongoing - Unchanged     Problem: Infection Risk (Fever with Neutropenia)  Goal: Absence of Infection  Outcome: Ongoing - Unchanged  Intervention: Prevent Infection and Maximize Resistance  Recent Flowsheet Documentation  Taken 03/29/2022 1200 by Sharlee Blew, RN  Bath/Shower:   shower   dressed/undressed  Oral Care: teeth brushed  Taken 03/29/2022 0810 by Sharlee Blew, RN  Infection Prevention: cohorting utilized

## 2022-03-29 NOTE — Unmapped (Signed)
Daily Progress Note      Active Problems:    B-cell acute lymphoblastic leukemia (ALL) (CMS-HCC)       LOS: 2 days     Assessment/Plan:Patricia [name] is an 46 y.o. 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     Today's Plan: D9 C5 HyperCVAD=8/29. No issues with D8 Rituximab  Patient has not had recurrent fever since 8/27 @ 1600. Complete Respiratory Pathogen Panel negative  Blood culture and urine culture remain no growth to date. Will transition from Cefepime to prophalxis Levaquin.  Start Tums prn for GERD.  Anticipate discharge tomorrow.  Continue Nivestym.    Immunocompromised fever: Admitted from infusion for neutropenic fever. Patient reports she fevered soon after discharge with Tmax 103F. She has been taking Tylenol since this fever, roughly once per day, last dose 8/26. She reports that she did not come to hospital for fever because she had appointment for today. Patient reports burning/throbbing sensation with urination, otherwise no localizing symptoms. ANC 1.7 today, down from 5.5 on discharge. In infusion center, temp 37.9, tachy to 124, normotensive. 1L IVF given, pancultured and started on cefepime.   [ ]  Urine and Blood cultures NG @ 24 hrs, continue to follow until finalized  [ ]  RPP pending, COVID negative on admission  - Cefepime 2g q8h plan for 48hrs , transition to levaquin on 8/29.  - Nivestym x 7 days (8/27 - )     Ph+ ALL, in remission: Initially diagnosed in 09/2021 and underwent [id number] induction complicated by PICC line associated superficial thrombus. BMBx on 5/2 revealed morphologic CR. C4 was complicated by admission for neutropenic fever with unclear source, though respiratory origin suspected given subtle LLL centrilobular groundglass opacities and patient presented with worsening cough. Discharged 7/28 after resolution of fevers off cefepime. Admitted for Cycle 5A HyperCVAD + Dasatinib, tolerated well, now readmitted with immunocompromised fever.  - C5D1 HyperCVAD (A cycle) = 03/21/22; C5D7 on admission   - Dasatinib 70 mg daily   - ppx Dapsone and Valtrex   - Nivestym x 7 days (8/27 - )      Migraines: Patient with longstanding history of recurrent unilateral head, neck, sinus pain in addition to postnasal drip and rhinorrhea.  Severe but dull pain that lasts for days, sensitive to light and sound.  Patient has received several courses of Augmentin with concern that this represented sinus infections; however CT sinus performed 7/20 revealed only minimal mucosal thickening of the right maxillary and right sphenoid sinuses.  ENT consult note from recent admission noted no signs of active infection.  Seen by neurology during recent admission who believed this was more consistent with migraine headaches.  They recommended conservative management and outpatient follow-up in neurology clinic.  - Magnesium, riboflavin supplementation  - Heating pads, neck stretches  -Neurology follow up scheduled     Hemorrhoids - Anal fissure: Patient continues to have significant pain from hemorrhoids.  She has trialed Sitz baths, lidocaine-based topical creams, calcium channel blocker creams, hydrocortisone cream without improvement. Seen by lower GI surgery during recent admission with recommendations as below.  - Lidocaine 4% cream PRN  - Now with soft daily bowel movements taking prune and apple juice daily.     Grade 1 peripheral neuropathy: Paraesthesias present primarily in her finger pads, currently without medical interventions continue to monitor while on therapy. Not affecting ADLs.     Anxiety: Reports taking Lexapro  10 mg at home, recently restarted.  - Continue Lexapro 10mg  daily     Immunocompromised status: Patient is immunocompromised secondary to chemotherapy  - Antimicrobial prophylaxis as above     Impending Electrolyte Abnormality Secondary to Chemotherapy and/or IV Fluids  - Daily Electrolyte monitoring  - Replete per [facility] guidelines.      Pancytopenia  - Transfuse 1 unit of pRBCs for hgb >7  - Transfuse 1 unit plt for plts >10K     Nutrition:                        Subjective:     Interval History: Patient report feeling generally well this morning.  Nor ruther reports of fevers, chill, rigors or other infectious symptoms.  Patient endorses ongoing sinus drainage although not worse than chronic drainage.  Sputum production is tannish in color.    10 point ROS otherwise negative except as above in the HPI.     Objective:     Vital signs in last 24 hours:  Temp:  [36.7 ??C (98.1 ??F)-37.1 ??C (98.8 ??F)] 36.7 ??C (98.1 ??F)  Heart Rate:  [85-112] 93  Resp:  [18] 18  BP: (97-133)/(66-94) 110/77  MAP (mmHg):  [77-106] 87  SpO2:  [96 %-99 %] 98 %    Intake/Output last 3 shifts:  I/O last 3 completed shifts:  In: 500 [P.O.:480; I.V.:20]  Out: -     Meds:  Current Facility-Administered Medications   Medication Dose Route Frequency Provider Last Rate Last Admin    acetaminophen (TYLENOL) tablet 650 mg  650 mg Oral Q6H PRN Lu Duffel, PA   650 mg at 03/29/22 0056    bisacodyL (DULCOLAX) EC tablet 5 mg  5 mg Oral Daily Lu Duffel, [place]        calcium carbonate (TUMS) chewable tablet 200 mg of elem calcium  200 mg of elem calcium Oral TID PRN Herbert Moors, AGNP        cetirizine (ZyrTEC) tablet 10 mg  10 mg Oral Q AM Lu Duffel, [place]   10 mg at 03/29/22 1610    dapsone tablet 100 mg  100 mg Oral Daily Lu Duffel, [place]   100 mg at 03/29/22 9604    dasatinib (SPRYCEL) tablet 70 mg  70 mg Oral Daily [name], MD   70 mg at 03/28/22 1815    dexamethasone (DECADRON) 4 mg/mL injection 20 mg  20 mg Intravenous Once PRN [name], MD        diphenhydrAMINE (BENADRYL) injection 25 mg  25 mg Intravenous Once [name], MD        diphenhydrAMINE (BENADRYL) injection 25 mg  25 mg Intravenous Once PRN [name], MD        emollient combination no.92 (LUBRIDERM) lotion 1 application.  1 application. Topical Q1H PRN Lu Duffel, PA        EPINEPHrine [facility]) injection 0.3 mg  0.3 mg Intramuscular Once PRN [name], MD        famotidine (PF) (PEPCID) injection 20 mg  20 mg Intravenous Once PRN [name], MD        filgrastim-aafi (NIVESTYM) injection syringe 480 mcg  480 mcg Subcutaneous Daily Meika Macaraeg [place], [place]   480 mcg at 03/28/22 1814    fluconazole (DIFLUCAN) tablet 200 mg  200 mg Oral Daily Lu Duffel, [place]   200 mg at 03/29/22 [phone]  fluticasone propionate (FLONASE) 50 mcg/actuation nasal spray 2 spray  2 spray Each Nare Daily PRN Janyth Contes, FNP        IP OKAY TO TREAT   Other Continuous PRN Lu Duffel, PA        IP OKAY TO TREAT   Other Continuous PRN Herbert Moors, AGNP        levalbuterol [facility]) nebulizer solution 0.31 mg  1 ampule Nebulization Q4H PRN Lu Duffel, PA        levoFLOXacin (LEVAQUIN) tablet 500 mg  500 mg Oral Daily Herbert Moors, AGNP   500 mg at 03/29/22 1027    lidocaine (XYLOCAINE) 2% viscous mucosal solution  15 mL Mouth Q4H PRN Herbert Moors, AGNP   15 mL at 03/28/22 1421    loperamide (IMODIUM) capsule 2 mg  2 mg Oral Q2H PRN Lu Duffel, PA        loperamide (IMODIUM) capsule 4 mg  4 mg Oral Once PRN Lu Duffel, PA        meperidine (DEMEROL) injection 25 mg  25 mg Intravenous Once PRN [name], MD        methylPREDNISolone sodium succinate (PF) (SOLU-medrol) injection 125 mg  125 mg Intravenous Once PRN [name], MD        OKAY TO SEND MEDICATION/CHEMOTHERAPY TO OUTPATIENT UNIT   Other Once [name], MD        sodium chloride (NS) 0.9 % flush 10 mL  10 mL Intravenous BID Lu Duffel, [place]   10 mL at 03/29/22 0924    sodium chloride (NS) 0.9 % infusion  20 mL/hr Intravenous Continuous Lu Duffel, PA        sodium chloride (NS) 0.9 % infusion  20 mL/hr Intravenous Continuous PRN [name], MD        sodium chloride 0.9% (NS) bolus 1,000 mL  1,000 mL Intravenous Once PRN [name], MD        valACYclovir (VALTREX) tablet 500 mg  500 mg Oral Daily Herbert Moors, AGNP   500 mg at 03/28/22 1815       Physical Exam:  General: Resting, in no apparent distress, lying in bed  HEENT:  PERRL. No scleral icterus or conjunctival injection. MMM without ulceration, erythema or exudate. No cervical or axillary lymphadenopathy.   Heart:  RRR. S1, S2. No murmurs, gallops, or rubs.  Lungs:  Breathing is unlabored, and patient is speaking full sentences with ease.  No stridor.  CTAB. No rales, ronchi, or crackles.    Abdomen:  No distention or pain on palpation.  Bowel sounds are present and normoactive x 4.  No palpable hepatomegaly or splenomegaly.  No palpable masses.  Skin:  No rashes, petechiae or purpura.  No areas of skin breakdown. Warm to touch, dry, smooth, and even.  Musculoskeletal:  No grossly-evident joint effusions or deformities.  Range of motion about the shoulder, elbow, hips and knees is grossly normal.  Psychiatric:  Range of affect is appropriate.    Neurologic:  Alert and oriented to person, place, time and situation.  Gait is normal.  No Nystagmus Cerebellar tasks (finger-to-nose, rapid hand movement, heel along shin) are completed with ease and are symmetric. CNII-CNXII grossly intact.  Extremities:  Appear well-perfused. No clubbing, edema, or cyanosis.  CVAD: R CW Port - no erythema, nontender; dressing CDI.      Labs:  Recent Labs  03/27/22  0815 03/27/22  0842 03/28/22  0403 03/29/22  0049   WBC 1.8*  --  2.7* 1.0*   NEUTROABS 1.7*  --  2.5 0.7*   LYMPHSABS 0.1*  --  0.1* 0.2*   HGB 8.7*  --  8.1* 8.0*   HCT 25.2*  --  23.1* 23.3*   PLT 95*  --  68* 21*   CREATININE  --  0.52* 0.51* 0.49*   BUN  --  16 12 17    BILITOT  --  1.0 1.1  --    BILIDIR  --   --  0.30  --    AST  --  37* 36*  --    ALT  --  86* 82*  --    ALKPHOS  --  66 62  --    K  --  2.8* 3.7 3.5   MG  --  2.0 2.0 1.8   CALCIUM  --  8.6* 8.5* 8.6*   NA  --  140 140 139   CL  --  105 110* 107   CO2  --  25.0 23.0 22.0   PHOS  --   --  2.7 2.6         Imaging:  No new.    Herbert Moors, [place]  03/29/2022  11:17 AM   Hematology/Oncology Department   [facility]   Group pager: [phone]

## 2022-03-29 NOTE — Unmapped (Unsigned)
Admitted into the hospital

## 2022-03-29 NOTE — Unmapped (Signed)
Pt alert and oriented. VSS, no falls and afebrile.  No acute events overnight. Pt complained of mouth pain and took Tylenol with good results.  Pt remains on room air with good results.  Safety measures remain in place with bed low, brakes locked, side rails up, call bell within reach, non-skid footwear while out of bed.  Will continue to monitor..    Vitals:    03/28/22 2324 03/29/22 0325 03/29/22 0626 03/29/22 0741   BP: 128/94 120/83 119/82 97/66   Pulse: 104 102 85 93   Resp: 18  18 18    Temp: 36.8 ??C (98.2 ??F) 36.7 ??C (98.1 ??F) 36.7 ??C (98.1 ??F) 36.7 ??C (98.1 ??F)   TempSrc: Oral Oral Oral Oral   SpO2: 99% 96% 97% 98%   Weight:          Problem: Adult Inpatient Plan of Care  Goal: Plan of Care Review  Outcome: Progressing  Flowsheets (Taken 03/29/2022 0846)  Progress: improving  Plan of Care Reviewed With: patient  Goal: Patient-Specific Goal (Individualized)  Outcome: Progressing  Flowsheets (Taken 03/29/2022 0846)  Patient-Specific Goals (Include Timeframe): Patient would like care clustered so she can get some rest overnight.  Individualized Care Needs: Cluster care, empathetic listening, presence, monitor labs and vitals, provide replacements as needed per MAR.  Anxieties, Fears or Concerns: Patient is concerned about what may have caused her to fever.  Goal: Absence of Hospital-Acquired Illness or Injury  Outcome: Progressing  Intervention: Identify and Manage Fall Risk  Recent Flowsheet Documentation  Taken 03/28/2022 2138 by Weber Cooks, RN  Safety Interventions:   bleeding precautions   environmental modification   fall reduction program maintained   infection management   lighting adjusted for tasks/safety   low bed   neutropenic precautions   nonskid shoes/slippers when out of bed   room near unit station  Intervention: Prevent Skin Injury  Recent Flowsheet Documentation  Taken 03/28/2022 2138 by Weber Cooks, RN  Skin Protection:   adhesive use limited   protective footwear used   transparent dressing maintained   tubing/devices free from skin contact  Intervention: Prevent and Manage VTE (Venous Thromboembolism) Risk  Recent Flowsheet Documentation  Taken 03/28/2022 2138 by Weber Cooks, RN  VTE Prevention/Management:   ambulation promoted   bleeding precautions maintained   bleeding risk factors identified   dorsiflexion/plantar flexion performed   fluids promoted  Intervention: Prevent Infection  Recent Flowsheet Documentation  Taken 03/28/2022 2138 by Weber Cooks, RN  Infection Prevention:   cohorting utilized   hand hygiene promoted   personal protective equipment utilized   rest/sleep promoted   single patient room provided   visitors restricted/screened  Goal: Optimal Comfort and Wellbeing  Outcome: Progressing  Goal: Readiness for Transition of Care  Outcome: Progressing  Goal: Rounds/Family Conference  Outcome: Progressing

## 2022-03-30 LAB — CBC W/ AUTO DIFF
BASOPHILS ABSOLUTE COUNT: 0 10*9/L (ref 0.0–0.1)
BASOPHILS RELATIVE PERCENT: 0.8 %
EOSINOPHILS ABSOLUTE COUNT: 0 10*9/L (ref 0.0–0.5)
EOSINOPHILS RELATIVE PERCENT: 11.4 %
HEMATOCRIT: 23.6 % — ABNORMAL LOW (ref 34.0–44.0)
HEMOGLOBIN: 8.2 g/dL — ABNORMAL LOW (ref 11.3–14.9)
LYMPHOCYTES ABSOLUTE COUNT: 0.2 10*9/L — ABNORMAL LOW (ref 1.1–3.6)
LYMPHOCYTES RELATIVE PERCENT: 66.6 %
MEAN CORPUSCULAR HEMOGLOBIN CONC: 34.9 g/dL (ref 32.0–36.0)
MEAN CORPUSCULAR HEMOGLOBIN: 32.4 pg (ref 25.9–32.4)
MEAN CORPUSCULAR VOLUME: 92.8 fL (ref 77.6–95.7)
MEAN PLATELET VOLUME: 7.7 fL (ref 6.8–10.7)
MONOCYTES ABSOLUTE COUNT: 0 10*9/L — ABNORMAL LOW (ref 0.3–0.8)
MONOCYTES RELATIVE PERCENT: 4 %
NEUTROPHILS ABSOLUTE COUNT: 0.1 10*9/L — CL (ref 1.8–7.8)
NEUTROPHILS RELATIVE PERCENT: 17.2 %
PLATELET COUNT: 12 10*9/L — ABNORMAL LOW (ref 150–450)
RED BLOOD CELL COUNT: 2.54 10*12/L — ABNORMAL LOW (ref 3.95–5.13)
RED CELL DISTRIBUTION WIDTH: 23.8 % — ABNORMAL HIGH (ref 12.2–15.2)
WBC ADJUSTED: 0.3 10*9/L — CL (ref 3.6–11.2)

## 2022-03-30 LAB — BASIC METABOLIC PANEL
ANION GAP: 8 mmol/L (ref 5–14)
BLOOD UREA NITROGEN: 13 mg/dL (ref 9–23)
BUN / CREAT RATIO: 25
CALCIUM: 9.1 mg/dL (ref 8.7–10.4)
CHLORIDE: 106 mmol/L (ref 98–107)
CO2: 24 mmol/L (ref 20.0–31.0)
CREATININE: 0.51 mg/dL — ABNORMAL LOW
EGFR CKD-EPI (2021) FEMALE: >90 mL/min/{1.73_m2} (ref >=60)
GLUCOSE RANDOM: 91 mg/dL (ref 70–179)
POTASSIUM: 3.7 mmol/L (ref 3.4–4.8)
SODIUM: 138 mmol/L (ref 135–145)

## 2022-03-30 LAB — SLIDE REVIEW

## 2022-03-30 LAB — MAGNESIUM: MAGNESIUM: 1.9 mg/dL (ref 1.6–2.6)

## 2022-03-30 LAB — PHOSPHORUS: PHOSPHORUS: 3.5 mg/dL (ref 2.4–5.1)

## 2022-03-30 LAB — PLATELET COUNT: PLATELET COUNT: 56 10*9/L — ABNORMAL LOW (ref 150–450)

## 2022-03-30 MED ADMIN — famotidine (PEPCID) tablet 20 mg: 20 mg | ORAL | @ 13:00:00 | Stop: 2022-03-30

## 2022-03-30 MED ADMIN — dapsone tablet 100 mg: 100 mg | ORAL | @ 13:00:00 | Stop: 2022-03-30

## 2022-03-30 MED ADMIN — fluconazole (DIFLUCAN) tablet 200 mg: 200 mg | ORAL | @ 13:00:00 | Stop: 2022-03-30

## 2022-03-30 MED ADMIN — levoFLOXacin (LEVAQUIN) tablet 500 mg: 500 mg | ORAL | @ 13:00:00 | Stop: 2022-03-30

## 2022-03-30 MED ADMIN — sodium chloride (NS) 0.9 % flush 10 mL: 10 mL | INTRAVENOUS | @ 04:00:00 | Stop: 2022-03-30

## 2022-03-30 MED ADMIN — vinCRIStine (ONCOVIN) 2 mg in sodium chloride (NS) 0.9 % 25 mL IVPB: 2 mg | INTRAVENOUS | @ 17:00:00 | Stop: 2022-03-30

## 2022-03-30 MED ADMIN — sodium chloride (NS) 0.9 % flush 10 mL: 10 mL | INTRAVENOUS | @ 13:00:00 | Stop: 2022-03-30

## 2022-03-30 MED ADMIN — cetirizine (ZyrTEC) tablet 10 mg: 10 mg | ORAL | @ 13:00:00 | Stop: 2022-03-30

## 2022-03-30 MED ADMIN — calcium carbonate (TUMS) chewable tablet 200 mg of elem calcium: 200 mg | ORAL | @ 03:00:00

## 2022-03-30 NOTE — Unmapped (Signed)
Pt VSS, afebrile.  Pt denies pain.  Pt provided PRN tums 1x w/ no relief.  Provider notified, in process of determining another appropriate PRN. She was also provided 1 unit platelets, tolerated well.  No falls or acute events during shift.  WCTM    Problem: Adult Inpatient Plan of Care  Goal: Plan of Care Review  Outcome: Progressing  Goal: Patient-Specific Goal (Individualized)  Outcome: Progressing  Goal: Absence of Hospital-Acquired Illness or Injury  Outcome: Progressing  Intervention: Identify and Manage Fall Risk  Recent Flowsheet Documentation  Taken 03/29/2022 2006 by Heriberto Antigua, RN  Safety Interventions:   fall reduction program maintained   lighting adjusted for tasks/safety   low bed   nonskid shoes/slippers when out of bed  Intervention: Prevent Skin Injury  Recent Flowsheet Documentation  Taken 03/29/2022 2006 by Heriberto Antigua, RN  Skin Protection: adhesive use limited  Intervention: Prevent and Manage VTE (Venous Thromboembolism) Risk  Recent Flowsheet Documentation  Taken 03/29/2022 2006 by Heriberto Antigua, RN  Activity Management: activity adjusted per tolerance  Intervention: Prevent Infection  Recent Flowsheet Documentation  Taken 03/29/2022 2006 by Heriberto Antigua, RN  Infection Prevention:   cohorting utilized   hand hygiene promoted   rest/sleep promoted  Goal: Optimal Comfort and Wellbeing  Outcome: Progressing  Goal: Readiness for Transition of Care  Outcome: Progressing  Goal: Rounds/Family Conference  Outcome: Progressing     Problem: Fever (Fever with Neutropenia)  Goal: Baseline Body Temperature  Outcome: Progressing     Problem: Infection Risk (Fever with Neutropenia)  Goal: Absence of Infection  Outcome: Progressing  Intervention: Prevent Infection and Maximize Resistance  Recent Flowsheet Documentation  Taken 03/29/2022 2006 by Heriberto Antigua, RN  Infection Prevention:   cohorting utilized   hand hygiene promoted   rest/sleep promoted  Oral Care: teeth brushed

## 2022-03-30 NOTE — Unmapped (Signed)
Physician Discharge Summary [facility]  4 ONC [facility]  [address]  [place] [address]  Dept: [phone]  Loc: [phone]     Identifying Information:   Patricia [name]  [birth year]  [id number]    Primary Care Physician: [name], MD     Referring Physician: [name]     Code Status: Full Code    Admit Date: 03/27/2022    Discharge Date: 03/30/2022     Discharge To: Home    Discharge Service: [facility] - Hematology APP Floor Team (MEDQ)     Discharge Attending Physician: Rolanda Lundborg, MD    Discharge Diagnoses:  Active Problems:    B-cell acute lymphoblastic leukemia (ALL) (CMS-HCC)  Resolved Problems:    * No resolved hospital problems. *      Outpatient Provider Follow Up Issues:   Supportive Care Recommendations:  We recommend based on the patient???s underlying diagnosis and treatment history the following supportive care:    1. Antimicrobial prophylaxis:  ALL (in remission, receiving post-remission therapy): Bacterial: Levofloxacin 500mg  PO daily (when absolute neutrophils </= 0.5);   Fungal: Fluconazole 400mg  PO daily (when absolute neutrophils </= 0.5);   Viral: Valacyclovir 500mg  PO daily (Continuous);   PJP:  For SMX/TMP intolerant patients:  Dapsone 100mg  PO daily; monthly nebulized pentamidine    2. Blood product support:  Leukoreduced blood products are required.  Irradiated blood products are preferred, but in case of urgent transfusion needs non-irradiated blood products may be used:     -  RBC transfusion threshold: transfuse 1 units for Hgb < 8 g/dL.  -  Platelet transfusion threshold: transfuse 1 unit of platelets for platelet count < 10, or for bleeding or need for invasive procedure.    Based on the patient's disease status and intensity of therapy, complete blood count with differential should be evaluated 2 times per week and used to guide transfusion support    3. Hematopoietic growth factor support: other    Hospital Course:   Patricia [name] is an 46 y.o. 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     Immunocompromised fever: Admitted from infusion for neutropenic fever. Patient reports she fevered soon after discharge with Tmax 103F. She has been taking Tylenol since this fever, roughly once per day, last dose 8/26. She reports that she did not come to hospital for fever because she had appointment for today. Patient reports burning/throbbing sensation with urination, otherwise no localizing symptoms. ANC 1.7 today, down from 5.5 on discharge. In infusion center, temp 37.9, tachy to 124, normotensive. 1L IVF given, pancultured and started on cefepime.  Urine cultures, blood culture NGTD. RPP and COVID Negative. S/p Cefepime 2g q8h for 48hrs , transition to levaquin on 8/29.  - Nivestym x 7 days (8/27 - 9/2) Continued on discharge     Ph+ ALL, in remission: Initially diagnosed in 09/2021 and underwent [id number] induction complicated by PICC line associated superficial thrombus. BMBx on 5/2 revealed morphologic CR. C4 was complicated by admission for neutropenic fever with unclear source, though respiratory origin suspected given subtle LLL centrilobular groundglass opacities and patient presented with worsening cough. Discharged 7/28 after resolution of fevers off cefepime. Admitted for Cycle 5A HyperCVAD + Dasatinib, tolerated well, now readmitted with immunocompromised fever.  - C5D1 HyperCVAD (A cycle) = 03/21/22; C5D7 on admission   -  Continue Dasatinib 70 mg daily and  ppx Dapsone, Levaquin,  Fluconazole, and Valtrex   -  Administer Nivestym x 7 days (8/27 - 9/2)      Migraines: Patient with longstanding history of recurrent unilateral head, neck, sinus pain in addition to postnasal drip and rhinorrhea.  Severe but dull pain that lasts for days, sensitive to light and sound.  Patient has received several courses of Augmentin with concern that this represented sinus infections; however CT sinus performed 7/20 revealed only minimal mucosal thickening of the right maxillary and right sphenoid sinuses.  ENT consult note from recent admission noted no signs of active infection.  Seen by neurology during recent admission who believed this was more consistent with migraine headaches.  They recommended conservative management and outpatient follow-up in neurology clinic.  - Continue Magnesium, riboflavin supplementation, Heating pads, neck stretches, Neurology follow up scheduled     Hemorrhoids - Anal fissure: Patient continues to have significant pain from hemorrhoids.  She has trialed Sitz baths, lidocaine-based topical creams, calcium channel blocker creams, hydrocortisone cream without improvement. Seen by lower GI surgery during recent admission with recommendations as below.  - Lidocaine 4% cream PRN, Now with soft daily bowel movements taking prune and apple juice daily.     Grade 1 peripheral neuropathy: Paraesthesias present primarily in her finger pads, currently without medical interventions continue to monitor while on therapy. Not affecting ADLs.     Anxiety: Reports taking Lexapro 10 mg at home, recently restarted.  - Continue Lexapro 10mg  daily on discharge     Immunocompromised status: Patient is immunocompromised secondary to chemotherapy  - Antimicrobial prophylaxis as above     Impending Electrolyte Abnormality Secondary to Chemotherapy and/or IV Fluids  - Daily Electrolyte monitoring  - Replete per [facility] guidelines.      Pancytopenia  - Transfuse 1 unit of pRBCs for hgb >7  - Transfuse 1 unit plt for plts >10K     Procedures:  None  ONCBCN INFUSION APPOINTMENT REQUEST [[id number]]  LAB APPOINTMENT REQUEST [[id number]]  ______________________________________________________________________  Discharge Medications:     Your Medication List        STOP taking these medications      bisacodyL 5 mg EC tablet  Commonly known as: DULCOLAX     docusate sodium 100 MG capsule  Commonly known as: COLACE     NON FORMULARY PATIENT SUPPLIED MED            CHANGE how you take these medications      dexAMETHasone 4 MG tablet  Commonly known as: DECADRON  Take 10 tablets (40 mg total) by mouth daily for 4 days. Take on 03/30/2022 until 04/02/2022.  Start taking on: March 31, 2022  What changed: additional instructions            CONTINUE taking these medications      acetaminophen 650 MG CR tablet  Commonly known as: TYLENOL 8 HOUR  Take 1 tablet (650 mg total) by mouth every eight (8) hours as needed for pain.     albuterol 90 mcg/actuation inhaler  Commonly known as: PROVENTIL HFA;VENTOLIN HFA  Inhale 2 puffs every six (6) hours as needed for wheezing.     cetirizine 10 MG tablet  Commonly known as: ZyrTEC  Take 1 tablet (10 mg total) by mouth nightly.     dapsone 100 MG tablet  Take 1 tablet (100 mg total) by mouth daily.     filgrastim-aafi 480 mcg/0.8 mL Syrg injection syringe  Commonly known as: NIVESTYM  Inject the contents of one  syringe (480 mcg or 0.8 mL) under the skin once daily for 7 days with each cycle of chemotherapy. Administer 24 hours after completion of chemotherapy.     fluconazole 200 MG tablet  Commonly known as: DIFLUCAN  Take 1 tablet (200 mg total) by mouth daily.     levalbuterol 0.31 mg/3 mL nebulizer solution  Commonly known as: XOPENEX  Inhale 3 mL (0.31 mg total) by nebulization every four (4) hours as needed for wheezing (cough).     levoFLOXacin 500 MG tablet  Commonly known as: LEVAQUIN  Take 1 tablet (500 mg total) by mouth daily.     oxyCODONE 5 MG immediate release tablet  Commonly known as: ROXICODONE  Take 1-2 tablets (5-10 mg total) by mouth every four (4) hours as needed for pain     prochlorperazine 10 MG tablet  Commonly known as: COMPAZINE  Take 1 tablet (10 mg total) by mouth every six (6) hours as needed for up to 14 days.     SpryceL 70 MG tablet  Generic drug: dasatinib  Take 1 tablet (70 mg total) by mouth daily.     valACYclovir 500 MG tablet  Commonly known as: VALTREX  Take 1 tablet (500 mg total) by mouth daily.              Allergies:  Erythromycin, Other, Sulfa (sulfonamide antibiotics), and Azithromycin  ______________________________________________________________________  Pending Test Results (if blank, then none):  Pending Labs       Order Current Status    Blood Culture #1 Preliminary result    Blood Culture #2 Preliminary result            Most Recent Labs:  All lab results last 24 hours -   Recent Results (from the past 24 hour(s))   Basic Metabolic Panel    Collection Time: 03/30/22 12:27 AM   Result Value Ref Range    Sodium 138 135 - 145 mmol/L    Potassium 3.7 3.4 - 4.8 mmol/L    Chloride 106 98 - 107 mmol/L    CO2 24.0 20.0 - 31.0 mmol/L    Anion Gap 8 5 - 14 mmol/L    BUN 13 9 - 23 mg/dL    Creatinine 9.62 (L) 0.60 - 0.80 mg/dL    BUN/Creatinine Ratio 25     eGFR CKD-EPI (2021) Female >90 >=60 mL/min/1.80m2    Glucose 91 70 - 179 mg/dL    Calcium 9.1 8.7 - 95.2 mg/dL   Magnesium Level    Collection Time: 03/30/22 12:27 AM   Result Value Ref Range    Magnesium 1.9 1.6 - 2.6 mg/dL   Phosphorus Level    Collection Time: 03/30/22 12:27 AM   Result Value Ref Range    Phosphorus 3.5 2.4 - 5.1 mg/dL   Type and Screen subsequent    Collection Time: 03/30/22 12:27 AM   Result Value Ref Range    Reject/Recollect REJECT    CBC w/ Differential    Collection Time: 03/30/22 12:27 AM   Result Value Ref Range    WBC 0.3 (LL) 3.6 - 11.2 10*9/L    RBC 2.54 (L) 3.95 - 5.13 10*12/L    HGB 8.2 (L) 11.3 - 14.9 g/dL    HCT 84.1 (L) 32.4 - 44.0 %    MCV 92.8 77.6 - 95.7 fL    MCH 32.4 25.9 - 32.4 pg    MCHC 34.9 32.0 - 36.0 g/dL    RDW 40.1 (H) 02.7 - 15.2 %  MPV 7.7 6.8 - 10.7 fL    Platelet 12 (L) 150 - 450 10*9/L    Neutrophils % 17.2 %    Lymphocytes % 66.6 %    Monocytes % 4.0 %    Eosinophils % 11.4 %    Basophils % 0.8 %    Absolute Neutrophils 0.1 (LL) 1.8 - 7.8 10*9/L    Absolute Lymphocytes 0.2 (L) 1.1 - 3.6 10*9/L    Absolute Monocytes 0.0 (L) 0.3 - 0.8 10*9/L    Absolute Eosinophils 0.0 0.0 - 0.5 10*9/L    Absolute Basophils 0.0 0.0 - 0.1 10*9/L    Anisocytosis Marked (A) Not Present   Morphology Review    Collection Time: 03/30/22 12:27 AM   Result Value Ref Range    Smear Review Comments See Comment (A) Undefined    Giant Platelets Present (A) Not Present   Prepare Platelet Pheresis    Collection Time: 03/30/22  5:32 AM   Result Value Ref Range    Unit Blood Type O Pos     ISBT Number 5100     Unit # [id number]     Status Issued     Product ID Platelets     PRODUCT CODE [id number]    Platelet Count    Collection Time: 03/30/22  7:19 AM   Result Value Ref Range    Platelet 56 (L) 150 - 450 10*9/L   Prepare RBC    Collection Time: 03/30/22  1:53 PM   Result Value Ref Range    Crossmatch Compatible     Unit Blood Type O Pos     ISBT Number 5100     Unit # [id number]     Status Issued     Spec Expiration [id number]     Product ID Red Blood Cells     PRODUCT CODE [id number]        Relevant Studies/Radiology (if blank, then none):  No results found.  ______________________________________________________________________    Activity Instructions       Activity as tolerated              Diet Instructions       Discharge diet (specify)      Discharge Nutrition Therapy: Regular            Other Instructions       Call MD for:      Fever greater than 100.81F    Call MD for:  difficulty breathing, headache or visual disturbances      Call MD for:  extreme fatigue      Call MD for:  hives      Call MD for:  persistent dizziness or light-headedness      Call MD for:  persistent nausea or vomiting      Call MD for:  redness, tenderness, or signs of infection (pain, swelling, redness, odor or green/yellow discharge around incision site)      Call MD for:  severe uncontrolled pain      Call MD for:  vaginal bleeding saturating more than 1 pad per hour.      Discharge instructions      New/Important Medications:  - Valtrex - helps to prevent shingles  - Levaquin helps prevent bacterial infection  - Dapsone- helps prevent certain types of pneumonia  - Fluconazole help prevent fungal infections  -Dexamethasone helps prevent Vicristine Related Nausea, take first dose today for 4 days (8/30-9/3)  -Nivestym injection for a total of 7 days. (8/27- 9/2)  Follow up Appointments:   - Labs with transfusion support 2 times weekly at [facility] starting on 9/4  - Dr. Lawerance Cruel on 9/7 at [facility]     Diagnosis: Ph+ ALL    Course complications:  -  Fever of unknown Origin      When to Call Your [facility] Cancer Care Team:   Monday- Friday from 8:00 am - 5:00 pm      Call [phone]      Or Toll free [phone]      Ask to speak to the Triage Nurse  Triage Nurse will Direct your call to Hematology/Oncology Fellow On-Call.     For Spanish speakers please call [phone]  you will be connected with your team and an Interpreter     RED ZONE:  Take action now!  You need to be seen right away. Call 911 or go to your nearest hospital for help.   - Symptoms are at a severe level of discomfort    - Bleeding that will not stop  - Chest Pain    - Hard to breathe    - Fall or passing out    - New Seizure    - Thoughts of hurting yourself or others     YELLOW ZONE: Take action today  This is NOT an all-inclusive list. Pleae call with any new or worsening symptoms.   Call your doctor, nurse or other healthcare provider at [phone]  You can be seen by a provider the same day through our Same Day Acute Care for Patients with Cancer program.   - Symptoms are new or worsening; You are not within your goal range for:    - Pain          - Swelling (leg, arm, abdomen, face, neck)    - Shortness of breath       - Skin rash or skin changes    - Bleeding (nose, urine, stool, wound)    - Wound issues (redness, drainage, re-opened)    - Feeling sick to your stomach and throwing up    - Confusion    - Mouth sores/pain in your mouth or throat    - Vision changes   - Hard stool or very loose stools (increase in ostomy   - Fever >100.4 F, chills   Output)        - Worsening cough with mucus that is green, yellow or bloody   - No urine for 12 hours      - Pain or burning when going to the bathroom    - Feeding tube or other catheter/tube issue    - Home infusion pump issue - call [phone]   - Redness or pain at previous IV or port/catheter site    - Depressed or anxiety     GREEN ZONE: You are in control   Your symptoms are under control. Continue to take your medicine as ordered. Keep all visits to the doctor.   - No increase or worsening symptoms   - Able to take your medicine   - Able to drink and eat     For your safety and best care, please DO NOT use MyChart messages to report red or yellow symptoms.   MyChart messages are only checked during weekday normal business hours and you should receive a   Response within 2 business days.   Please use MyChart only for the follows:   - Non-urgent medication refills, scheduling requests or general questions.  Patient Education:     Your blood counts may continue to drop, so it is good to adhere to the following precautions:    - Wash your hands routinely with soap and water  - Take your temperature when you have chills or are not feeling well  - Use a soft toothbrush  - Avoid constipation or straining with bowel movements. This may mean you occasionally need to take over-the-counter stool softeners or laxatives.   - Avoid people who have colds or the flu, or are not feeling well.  - Wear a mask when visiting crowded places.  - Avoid anyone who has received a live vaccination (shot) within the last three weeks  - Maintain a well-balanced diet and eat healthy foods  - Speak with your doctor before having any dental work done  - Limit exposure to pet feces (e.g., litter box)  - Do only as much activity as you can tolerate    Other instructions:  - Don't use dental floss if your platelet count is below 50,000. Your doctor or nurse should tell you if this is the case.  - Use any mouthwashes given to you as directed.  - If you can't tolerate regular brushing, use an oral swab (bristle-less) toothbrush, or use salt and baking soda to clean your mouth. Mix 1 teaspoon of salt and 1 teaspoon of baking soda into an 8-ounce glass of warm water. Swish and spit.  - Watch your mouth and tongue for white patches. This is a sign of fungal infection, a common side effect of chemotherapy. Be sure to tell your doctor about these patches. Medication/mouthwashes can be prescribed to help you fight the fungal infection.      COVID-19 is a new challenge, but [place] and the [facility] is dedicated to providing you and your loved ones with the best possible cancer care and support in the safest way possible during this time. We made two videos about the ways we are working to keep you safe, such as offering the option to visit your care team over the phone or through a video, as well as support services offered for our patients and their caregivers. If you have any questions about your cancer care, please call your care team.     Video #1: Keeping [facility] Cancer Care patients safe during the COVID-19 crisis  http://go.eabjmlille.com     Video #2: Support for cancer patients and their caregivers during the COVID-19 pandemic  http://go.SecureGap.uy            Follow Up instructions and Outpatient Referrals     Call MD for:      Call MD for:  difficulty breathing, headache or visual disturbances      Call MD for:  extreme fatigue      Call MD for:  hives      Call MD for:  persistent dizziness or light-headedness      Call MD for:  persistent nausea or vomiting      Call MD for:  redness, tenderness, or signs of infection (pain, swelling,   redness, odor or green/yellow discharge around incision site)      Call MD for:  severe uncontrolled pain      Call MD for:  vaginal bleeding saturating more than 1 pad per hour.      Discharge instructions      Urinalysis with Microscopy      Release to patient: Immediate  Appointments which have been scheduled for you      Apr 04, 2022 11:00 AM  (Arrive by 10:30 AM)  BLOOD TRANSFUSION - 2 UNITS with [employer] CHAIR 14  [place] ONCOLOGY INFUSION [place] [facility] REGION) [address]  [place] [address]  [phone]        Apr 07, 2022  9:45 AM  (Arrive by 9:15 AM)  LAB ONLY [place] with ADULT ONC LAB  [facility] ADULT ONCOLOGY LAB DRAW STATION [place] [facility] REGION) [address]  [place] [address]  [phone]        Apr 07, 2022 10:30 AM  (Arrive by 10:00 AM)  RETURN ACTIVE [place] with Lenon Ahmadi, [place]  [place] HEMATOLOGY ONCOLOGY 2ND FLR CANCER HOSP [facility] REGION) [address]  [place] [address]  [phone]        Apr 07, 2022  1:30 PM  (Arrive by 1:00 PM)  BLOOD TRANSFUSION - 2 UNITS with [employer] CHAIR 25  [place] ONCOLOGY INFUSION [place] [facility] REGION) [address]  [place] [address]  [phone]        Apr 11, 2022 12:30 PM  (Arrive by 12:00 PM)  NURSE LAB DRAW with ADULT ONC LAB  [facility] ADULT ONCOLOGY LAB DRAW STATION [place] [facility] REGION) [address]  [place] [address]  [phone]        Apr 11, 2022  1:30 PM  (Arrive by 1:00 PM)  BLOOD TRANSFUSION - 2 UNITS with [employer] CHAIR 33  [place] ONCOLOGY INFUSION [facility] REGION) [address]  [place] [address]  [phone]        Apr 14, 2022 12:30 PM  (Arrive by 12:00 PM)  BLOOD TRANSFUSION - 2 UNITS with [employer] CHAIR 25  [place] ONCOLOGY INFUSION [place] [facility] REGION) [address]  [place] [address]  [phone]        Jun 30, 2022  1:55 PM  (Arrive by 1:40 PM)  RETURN NEUROLOGY with Idelia Salm, MD  [facility] CL[facility]IC [place] [address] [place] [facility] REGION) [address]  [place] [address]  [phone]             ______________________________________________________________________  Discharge Day Services:  BP 111/84  - Pulse 116  - Temp 36.7 ??C (98.1 ??F) (Oral)  - Resp 18  - Ht 167.6 cm (5' 6)  - Wt 82.1 kg (180 lb 14.2 oz)  - SpO2 97%  - BMI 29.20 kg/m??   Pt seen on the day of discharge and determined appropriate for discharge.    Condition at Discharge: good    Length of Discharge: I spent greater than 30 mins in the discharge of this patient.    Vena Rua. Oliva Bustard  Nurse Practitioner  Hematology/Oncology  [facility]    Group pager: [phone]

## 2022-03-30 NOTE — Unmapped (Addendum)
Pt VSS, afebrile, A/Ox4. Pt received one unit pRBCs and also received vincristine prior to discharge. Pt educated on discharge teaching and all associated information prior to leaving the hospital. Pt port deaccessed. Pt free of falls or injuries with all safety measures maintained.   Problem: Adult Inpatient Plan of Care  Goal: Plan of Care Review  Outcome: Discharged to Home  Goal: Patient-Specific Goal (Individualized)  Outcome: Discharged to Home  Goal: Absence of Hospital-Acquired Illness or Injury  Outcome: Discharged to Home  Intervention: Identify and Manage Fall Risk  Recent Flowsheet Documentation  Taken 03/30/2022 0720 by Jimmy Picket, RN  Safety Interventions:  ??? low bed  ??? lighting adjusted for tasks/safety  ??? bed alarm  ??? fall reduction program maintained  ??? family at bedside  ??? infection management  ??? nonskid shoes/slippers when out of bed  ??? room near unit station  Intervention: Prevent Skin Injury  Recent Flowsheet Documentation  Taken 03/30/2022 0900 by Jimmy Picket, RN  Skin Protection:  ??? adhesive use limited  ??? cleansing with dimethicone incontinence wipes  ??? drying agents applied  Taken 03/30/2022 0720 by Jimmy Picket, RN  Skin Protection:  ??? cleansing with dimethicone incontinence wipes  ??? adhesive use limited  ??? drying agents applied  Intervention: Prevent and Manage VTE (Venous Thromboembolism) Risk  Recent Flowsheet Documentation  Taken 03/30/2022 0900 by Jimmy Picket, RN  VTE Prevention/Management:  ??? ambulation promoted  ??? fluids promoted  Taken 03/30/2022 0720 by Jimmy Picket, RN  Activity Management: activity adjusted per tolerance  Intervention: Prevent Infection  Recent Flowsheet Documentation  Taken 03/30/2022 0720 by Jimmy Picket, RN  Infection Prevention:  ??? hand hygiene promoted  ??? personal protective equipment utilized  ??? rest/sleep promoted  ??? single patient room provided  Goal: Optimal Comfort and Wellbeing  Outcome: Discharged to Home  Goal: Readiness for Transition of Care  Outcome: Discharged to Home  Goal: Rounds/Family Conference  Outcome: Discharged to Home     Problem: Fever (Fever with Neutropenia)  Goal: Baseline Body Temperature  Outcome: Discharged to Home     Problem: Infection Risk (Fever with Neutropenia)  Goal: Absence of Infection  Outcome: Discharged to Home  Intervention: Prevent Infection and Maximize Resistance  Recent Flowsheet Documentation  Taken 03/30/2022 0720 by Jimmy Picket, RN  Infection Prevention:  ??? hand hygiene promoted  ??? personal protective equipment utilized  ??? rest/sleep promoted  ??? single patient room provided

## 2022-03-30 NOTE — Unmapped (Addendum)
Patricia [name] is an 46 y.o. 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     Immunocompromised fever: Admitted from infusion for neutropenic fever. Patient reports she fevered soon after discharge with Tmax 103F. She has been taking Tylenol since this fever, roughly once per day, last dose 8/26. She reports that she did not come to hospital for fever because she had appointment for today. Patient reports burning/throbbing sensation with urination, otherwise no localizing symptoms. ANC 1.7 today, down from 5.5 on discharge. In infusion center, temp 37.9, tachy to 124, normotensive. 1L IVF given, pancultured and started on cefepime.  Urine cultures, blood culture NGTD. RPP and COVID Negative. S/p Cefepime 2g q8h for 48hrs , transition to levaquin on 8/29.  - Nivestym x 7 days (8/27 - 9/2) Continued on discharge     Ph+ ALL, in remission: Initially diagnosed in 09/2021 and underwent [id number] induction complicated by PICC line associated superficial thrombus. BMBx on 5/2 revealed morphologic CR. C4 was complicated by admission for neutropenic fever with unclear source, though respiratory origin suspected given subtle LLL centrilobular groundglass opacities and patient presented with worsening cough. Discharged 7/28 after resolution of fevers off cefepime. Admitted for Cycle 5A HyperCVAD + Dasatinib, tolerated well, now readmitted with immunocompromised fever.  - C5D1 HyperCVAD (A cycle) = 03/21/22; C5D7 on admission   -  Continue Dasatinib 70 mg daily and  ppx Dapsone, Levaquin, Fluconazole, and Valtrex   -  Administer Nivestym x 7 days (8/27 - 9/2)      Migraines: Patient with longstanding history of recurrent unilateral head, neck, sinus pain in addition to postnasal drip and rhinorrhea.  Severe but dull pain that lasts for days, sensitive to light and sound.  Patient has received several courses of Augmentin with concern that this represented sinus infections; however CT sinus performed 7/20 revealed only minimal mucosal thickening of the right maxillary and right sphenoid sinuses.  ENT consult note from recent admission noted no signs of active infection.  Seen by neurology during recent admission who believed this was more consistent with migraine headaches.  They recommended conservative management and outpatient follow-up in neurology clinic.  - Continue Magnesium, riboflavin supplementation, Heating pads, neck stretches, Neurology follow up scheduled     Hemorrhoids - Anal fissure: Patient continues to have significant pain from hemorrhoids.  She has trialed Sitz baths, lidocaine-based topical creams, calcium channel blocker creams, hydrocortisone cream without improvement. Seen by lower GI surgery during recent admission with recommendations as below.  - Lidocaine 4% cream PRN, Now with soft daily bowel movements taking prune and apple juice daily.     Grade 1 peripheral neuropathy: Paraesthesias present primarily in her finger pads, currently without medical interventions continue to monitor while on therapy. Not affecting ADLs.     Anxiety: Reports taking Lexapro 10 mg at home, recently restarted.  - Continue Lexapro 10mg  daily on discharge     Immunocompromised status: Patient is immunocompromised secondary to chemotherapy  - Antimicrobial prophylaxis as above     Impending Electrolyte Abnormality Secondary to Chemotherapy and/or IV Fluids  - Daily Electrolyte monitoring  - Replete per [facility] guidelines.      Pancytopenia  - Transfuse 1 unit of pRBCs for hgb >7  - Transfuse 1 unit plt for plts >10K

## 2022-03-31 MED ORDER — DEXAMETHASONE 4 MG TABLET
ORAL_TABLET | Freq: Every day | ORAL | 0 refills | 4 days | Status: CP
Start: 2022-03-31 — End: 2022-04-04

## 2022-03-31 NOTE — Unmapped (Signed)
[facility] Leukemia Clinic Follow Up Visit Note     [name]  Patient Age: 46 y.o.  Encounter Date: 04/07/2022    Primary Care Provider:  [name], MD    Referring Physician:  [name], MD  [address]  [place],  [place] [address]    Diagnosis:  Ph+ B-ALL  Treatment:   First-line GRAAPH-2005 with hyperCVAD + rituximab + dasatinib  Approach/Goals:  Curative  Cancer Status:  Morphologic CR, BCR/ABL p190 2/100,000 in marrow  Comorbities:   HTN, anxiety, hemorrhoids with anal fissures    Assessment/Plan:  Patricia [name] is a 46 y.o. female with past medical history of chronic right shoulder and back pain, hypertension, anxiety, and recently diagnosed Ph+ ALL. S/p induction on GRAAPH-2005 bone marrow biopsy demonstrated morphologic CR, BCR/ABL p190 2/100,000.    She is now post cycle 5 of GRAAPH + rituximab + dasatinib.  This course was complicated but neutropenic fevers. She is home but continuing to fever.  Her primary symptom is watery diarrhea. UPDATE: stool culture is negative for C difficile. Urine culture from [facility] ID's Enterococcus faecalis from 9/7.  I initially treated broadly with Augmentin+doxycycline on 9/7 but switched to Macrobid (no prior use) on 9/8.    She feels quite overwhelmed by hyperCVAD and does not feel able to tolerate further methotrexate. We discussed focusing on treating the infections and allowing her to recover. Will re-visit the discussion about treatment in 2 weeks. She is agreeable with meeting with Palliative Care again so I have contacted them.    Ph+ALL, in remission: On GRAAPH 2005 + dasatinib  - HOLD Cycle 6 while counts recover and UTI is treated   -B cycle R-HyperCVAD/MA+dasatinib   - 1 IT per cycle - did not tolerate 2 per cycle  - Dasatinib 70 mg     - Next bone marrow biopsy following cycle 8      Hemorrhoids, Anal fissure: persistent but stable, per patient 04/07/2022  - supportive care at home    Pancytopenia due to chemotherapy  - ANC recovered but platelets remain low 04/07/2022  - supportive care as detailed below when in nadir    Menses suppression: Lupron  - most recent dose 04/07/22  - next due 07/07/22     Psychosocial distress: She reports a moderate level of anxiety regarding the management of the above.   - Counseling given   - Consider ref to comprehensive cancer support program     Patient-centered care/Shared decision-making:   We discussed the plan above at length. The patient and her husband actively contributed to the conversation. Specifically, her most important outcomes are:   Prolonging overall survival and Maintaining her overall functional activity     Supportive Care Needs: We recommend based on the patient???s underlying diagnosis and treatment history the following supportive care:    1. Antimicrobial prophylaxis:  ALL (in remission, receiving post-remission therapy): Bacterial: Levofloxacin 500mg  PO daily (when absolute neutrophils </= 0.5);   Fungal: Fluconazole 200mg  PO daily (when absolute neutrophils </= 0.5);   Viral: Valacyclovir 500mg  PO daily (Continuous);   PJP:  SMX/TMP DS PO BID twice weekly (Sat/Sun)    2. Blood product support:  Leukoreduced blood products are required.  Irradiated blood products are preferred, but in case of urgent transfusion needs non-irradiated blood products may be used: 2 units for Hg <=8.0.     Coordination of care:   - see notes above  - continue  visits with transfusion support  - RTC in 2 weeks    [name], [facility]  Nurse Practitioner  Hematology/Oncology  [facility]    [name], MD was available  Leukemia Program  Division of Hematology  [facility]      Nurse Navigator (non-clinical trial patients): Elicia Lamp, RN        Tel. [phone]       Fax. [phone]  Toll-free appointments: [phone]  Scheduling assistance: [phone]  After hours/weekends: [phone] (ask for adult hematology/oncology on-call)      History of Present Illness:   Patricia [name] is a 46 y.o. female with past medical history noted as above who presents for follow up Ph+ ALL.      Her social history is notable for being a Runner, broadcasting/film/video.  She was recently married. She lives with her husband.    Interim history  Since her last cycle she has been hospitalized with neutropenic fevers - negative workup. She was in the ED at [facility] yesterday - treated/released.  She reports worsening diarrhea 5+ watery BMs last night. She has mild tenderness in the lower abdomen - bandlike but no sharp pains.  Fevers are continuing at home to around 101-102 and she has been using Tylenol. No chills. She feels hot/drained when the fever is present but she does not feel acutely bad. She would rather not be in the hospital unless absolutely necessary.  No breathing difficulty.  Hemorrhoids feel stable and she is managing them at home.  No new/unusual bleeding.  No n/v but food tastes like plastic.  Overall she feels overwhelmed. She does not want the cancer to return but she doesn't think she could tolerate 2 more cycles of methotrexate.    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 The majority of the abnormal cells (64/88) contained an additional BCR/ABL1 fusion signal, while 9/88 abnormal cells contained an additional ABL1 and ASS1 signal.       Genetics:   Karyotype/FISH:   RESULTS   Date Value Ref Range Status   10/28/2021 Final    NOTE: This report reflects a combined study from a peripheral blood and a bone marrow core biopsy. Eleven cells from the peripheral blood and nine cells from the bone marrow core biopsy were analyzed. The BCR/ABL1 FISH analysis was performed on the peripheral blood.     Abnormal Karyotype:  47,XX,t(9;22)(q34;q11.2),+der(22)t(9;22)[18]/46,XX[2]    Abnormal FISH:  A BCR/ABL1 interphase FISH assay showed a signal pattern consistent with a BCR::ABL1 rearrangement and the 9;22 translocation in 88% of the 100 cells scored. The majority of the abnormal cells (64/88) contained an additional BCR/ABL1 fusion signal, while 9/88 abnormal cells contained an additional ABL1 and ASS1 signal.           Pertinent Phenotypic data:  CD19 98%  CD20 on diagnosis 51%  CD22 98%      Treatment Timeline:  10/29/2021: Bone marrow biopsy: Ph+ ALL, 51% expression of CD20  10/30/21: Cycle 1 day 1 GRAAPH-2005  induction  11/03/21: ITT #1   11/12/2021: IT #2  11/18/2021: IT #3  11/29/2021: Post cycle 1 bone marrow biopsy: Morphologic CR.  MRD by flow insufficient, p190 2/100,000  12/10/2021: Cycle 2 B cycle + rituximab  12/14/21: IT#4  12/17/21: IT#5  01/10/22: Cycle 3 A cycle + rituximab  01/24/22: Bmbx - MRD-neg by PCR (p190)   02/17/22: C4 B cycle + ritux  02/18/22: IT#6  03/21/22: C5 A cycle + ritux (4th dose)  03/22/22: IT#7           Review of Systems:   ROS reviewed and negative except as noted in H and P     Allergies:  Allergies   Allergen Reactions    Erythromycin Hives    Other      Pt cant take ibuprofen due to condition.    Sulfa (Sulfonamide Antibiotics) Anaphylaxis    Azithromycin        Medications:     Current Outpatient Medications:     acetaminophen (TYLENOL 8 HOUR) 650 MG CR tablet, Take 1 tablet (650 mg total) by mouth every eight (8) hours as needed for pain., Disp: , Rfl:     albuterol HFA 90 mcg/actuation inhaler, Inhale 2 puffs every six (6) hours as needed for wheezing., Disp: , Rfl:     dapsone 100 MG tablet, Take 1 tablet (100 mg total) by mouth daily., Disp: 30 tablet, Rfl: 2    dasatinib (SPRYCEL) 70 MG tablet, Take 1 tablet (70 mg total) by mouth daily., Disp: 30 tablet, Rfl: 7    levalbuterol (XOPENEX) 0.31 mg/3 mL nebulizer solution, Inhale 3 mL (0.31 mg total) by nebulization every four (4) hours as needed for wheezing (cough)., Disp: 45 mL, Rfl: 0    oxyCODONE (ROXICODONE) 5 MG immediate release tablet, Take 1-2 tablets (5-10 mg total) by mouth every four (4) hours as needed for pain, Disp: , Rfl:     valACYclovir (VALTREX) 500 MG tablet, Take 1 tablet (500 mg total) by mouth daily., Disp: 90 tablet, Rfl: 3    cetirizine (ZYRTEC) 10 MG tablet, Take 1 tablet (10 mg total) by mouth nightly., Disp: 30 tablet, Rfl: 2    filgrastim-aafi (NIVESTYM) 480 mcg/0.8 mL Syrg injection syringe, Inject the contents of one syringe (480 mcg or 0.8 mL) under the skin once daily for 7 days with each cycle of chemotherapy. Administer 24 hours after completion of chemotherapy. (Patient not taking: Reported on 03/31/2022), Disp: 5.6 mL, Rfl: 3    loperamide (IMODIUM A-D) 2 mg tablet, Take 1 tablet (2 mg total) by mouth four (4) times a day as needed for diarrhea., Disp: 120 tablet, Rfl: 1    nitrofurantoin, macrocrystal-monohydrate, (MACROBID) 100 MG capsule, Take 1 capsule (100 mg total) by mouth Two (2) times a day for 7 days., Disp: 14 capsule, Rfl: 0    prochlorperazine (COMPAZINE) 10 MG tablet, Take 1 tablet (10 mg total) by mouth every six (6) hours as needed for up to 14 days. (Patient not taking: Reported on 04/07/2022), Disp: 30 tablet, Rfl: 0    Medical History:  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       Social History:  Social History     Social History Narrative    Not on file       Family History:  No family history on file.    Objective:   BP 107/81  - Pulse (S) 120  -  Temp 37.2 ??C (98.9 ??F) (Temporal)  - Resp 18  - Ht 167.6 cm (5' 5.98)  - Wt 81.3 kg (179 lb 3.7 oz)  - SpO2 95%  - BMI 28.94 kg/m??     Physical Exam:  GENERAL: Well-appearing woman accompanied by her husband.  HEART: Normal color, not excessive pallor.  CHEST/LUNG: Normal work of breathing. No dyspnea with conversation.  ABDOMEN: No obvious distention.  Normoactive bowel sounds. Mild tenderness to palpation of low mid-abdomen. No rebound or guarding  EXTREMITIES: No edema, cyanosis or clubbing.   SKIN: No noticable rash or petechiae.   NEURO EXAM: Grossly intact.   PSYCH: tearful at times        Test Results:  04/07/2022 CBC/d and CMP reviewed with patient

## 2022-03-31 NOTE — Unmapped (Signed)
[facility] Refill Coordination Note    Specialty Medication(s) to be Shipped:   Hematology/Oncology: Sprycel 70mg     Other medication(s) to be shipped: No additional medications requested for fill at this time     Patricia [name], DOB: 1976/03/29  Phone: [phone] (home)       All above HIPAA information was verified with patient.     Was a Nurse, learning disability used for this call? No    Completed refill call assessment today to schedule patient's medication shipment from the [facility] [phone]).  All relevant notes have been reviewed.     Specialty medication(s) and dose(s) confirmed: Regimen is correct and unchanged.   Changes to medications:  Patricia [name] was just discharged from the hospital. While admitted, she is given the supply from inpatient.  Changes to insurance: No  New side effects reported not previously addressed with a pharmacist or physician: None reported  Questions for the pharmacist: No    Confirmed patient received a Conservation officer, historic buildings and a Surveyor, mining with first shipment. The patient will receive a drug information handout for each medication shipped and additional FDA Medication Guides as required.       DISEASE/MEDICATION-SPECIFIC INFORMATION        N/A    SPECIALTY MEDICATION ADHERENCE     Medication Adherence    Patient reported X missed doses in the last month: 0  Specialty Medication: Sprycel 70mg   Patient is on additional specialty medications: No  Patient is on more than two specialty medications: No  Informant: patient  Reliability of informant: reliable  Provider-estimated medication adherence level: good  Reasons for non-adherence: no problems identified                                Were doses missed due to medication being on hold? No    SpryceL 70 MG tablet (dasatinib)  : 10+ days of medicine on hand        REFERRAL TO PHARMACIST     Referral to the pharmacist: Not needed      [facility]     Shipping address confirmed in Epic.     Delivery Scheduled: Yes, Expected medication delivery date: 04/12/22.     Medication will be delivered via Next Day Courier to the prescription address in Epic WAM.    Selah Klang' W Wilhemena Durie [facility] Specialty Technician

## 2022-04-04 ENCOUNTER — Ambulatory Visit: Admit: 2022-04-04 | Discharge: 2022-04-05 | Payer: PRIVATE HEALTH INSURANCE

## 2022-04-04 ENCOUNTER — Encounter: Admit: 2022-04-04 | Discharge: 2022-04-05 | Payer: PRIVATE HEALTH INSURANCE

## 2022-04-04 LAB — CBC W/ AUTO DIFF
BASOPHILS ABSOLUTE COUNT: 0 10*9/L (ref 0.0–0.1)
BASOPHILS RELATIVE PERCENT: 0 %
EOSINOPHILS ABSOLUTE COUNT: 0 10*9/L (ref 0.0–0.5)
EOSINOPHILS RELATIVE PERCENT: 0.2 %
HEMATOCRIT: 21.3 % — ABNORMAL LOW (ref 34.0–44.0)
HEMOGLOBIN: 7.6 g/dL — ABNORMAL LOW (ref 11.3–14.9)
LYMPHOCYTES ABSOLUTE COUNT: 0.3 10*9/L — ABNORMAL LOW (ref 1.1–3.6)
LYMPHOCYTES RELATIVE PERCENT: 11.8 %
MEAN CORPUSCULAR HEMOGLOBIN CONC: 35.8 g/dL (ref 32.0–36.0)
MEAN CORPUSCULAR HEMOGLOBIN: 32.9 pg — ABNORMAL HIGH (ref 25.9–32.4)
MEAN CORPUSCULAR VOLUME: 91.8 fL (ref 77.6–95.7)
MEAN PLATELET VOLUME: 9 fL (ref 6.8–10.7)
MONOCYTES ABSOLUTE COUNT: 1.2 10*9/L — ABNORMAL HIGH (ref 0.3–0.8)
MONOCYTES RELATIVE PERCENT: 56.6 %
NEUTROPHILS ABSOLUTE COUNT: 0.7 10*9/L — ABNORMAL LOW (ref 1.8–7.8)
NEUTROPHILS RELATIVE PERCENT: 31.4 %
PLATELET COUNT: 12 10*9/L — ABNORMAL LOW (ref 150–450)
RED BLOOD CELL COUNT: 2.32 10*12/L — ABNORMAL LOW (ref 3.95–5.13)
RED CELL DISTRIBUTION WIDTH: 21.2 % — ABNORMAL HIGH (ref 12.2–15.2)
WBC ADJUSTED: 2.1 10*9/L — ABNORMAL LOW (ref 3.6–11.2)

## 2022-04-04 LAB — COMPREHENSIVE METABOLIC PANEL
ALBUMIN: 2.9 g/dL — ABNORMAL LOW (ref 3.4–5.0)
ALKALINE PHOSPHATASE: 44 U/L — ABNORMAL LOW (ref 46–116)
ALT (SGPT): 55 U/L — ABNORMAL HIGH (ref 10–49)
ANION GAP: 7 mmol/L (ref 5–14)
AST (SGOT): 16 U/L (ref <=34)
BILIRUBIN TOTAL: 0.4 mg/dL (ref 0.3–1.2)
BLOOD UREA NITROGEN: 24 mg/dL — ABNORMAL HIGH (ref 9–23)
BUN / CREAT RATIO: 46
CALCIUM: 7.8 mg/dL — ABNORMAL LOW (ref 8.7–10.4)
CHLORIDE: 104 mmol/L (ref 98–107)
CO2: 27 mmol/L (ref 20.0–31.0)
CREATININE: 0.52 mg/dL — ABNORMAL LOW
EGFR CKD-EPI (2021) FEMALE: >90 mL/min/{1.73_m2} (ref >=60)
GLUCOSE RANDOM: 138 mg/dL (ref 70–179)
POTASSIUM: 3.8 mmol/L (ref 3.4–4.8)
PROTEIN TOTAL: 4.9 g/dL — ABNORMAL LOW (ref 5.7–8.2)
SODIUM: 138 mmol/L (ref 135–145)

## 2022-04-04 LAB — SLIDE REVIEW

## 2022-04-04 LAB — LACTATE DEHYDROGENASE: LACTATE DEHYDROGENASE: 222 U/L (ref 120–246)

## 2022-04-04 MED ADMIN — heparin, porcine (PF) 100 unit/mL injection 500 Units: 500 [IU] | INTRAVENOUS | @ 20:00:00 | Stop: 2022-04-05

## 2022-04-04 NOTE — Unmapped (Signed)
Pt arrived to unit, port accessed. Labs drawn. Pt will need 2 units PRBC's. Plts came back at 12, so I messaged APP to see if she wants me to transfuse 1 unit plts since pt is coming back Thursday. Per APP ok to hold off until Thursday. Pt was really persistent about getting a unit of plts, so I reached back out to our APP, and its ok to give if blood bank will release since parameters aren't below. Per blood bank they will not release plts unless they have a doctor order so we will hold off on plts till Thursdays recheck.

## 2022-04-04 NOTE — Unmapped (Signed)
RED ZONE Means: RED ZONE: Take action now!     You need to be seen right away  Symptoms are at a severe level of discomfort    Call 911 or go to your nearest  Hospital for help     - Bleeding that will not stop    - Hard to breathe    - New seizure - Chest pain  - Fall or passing out  -Thoughts of hurting    yourself or others      Call 911 if you are going into the RED ZONE                  YELLOW ZONE Means:     Please call with any new or worsening symptom(s), even if not on this list.  Call [phone]  After hours, weekends, and holidays - you will reach a long recording with specific instructions, If not in an emergency such as above, please listen closely all the way to the end and choose the option that relates to your need.   You can be seen by a provider the same day through our Same Day Acute Care for Patients with Cancer program.      YELLOW ZONE: Take action today     Symptoms are new or worsening  You are not within your goal range for:    - Pain    - Shortness of breath    - Bleeding (nose, urine, stool, wound)    - Feeling sick to your stomach and throwing up    - Mouth sores/pain in your mouth or throat    - Hard stool or very loose stools (increase in       ostomy output)    - No urine for 12 hours    - Feeding tube or other catheter/tube issue    - Redness or pain at previous IV or port/catheter site    - Depressed or anxiety   - Swelling (leg, arm, abdomen,     face, neck)  - Skin rash or skin changes  - Wound issues (redness, drainage,    re-opened)  - Confusion  - Vision changes  - Fever >100.4 F or chills  - Worsening cough with mucus that is    green, yellow, or bloody  - Pain or burning when going to the    bathroom  - Home Infusion Pump Issue- call    [phone]         Call your healthcare provider if you are going into the YELLOW ZONE     GREEN ZONE Means:  Your symptoms are under controls  Continue to take your medicine as ordered  Keep all visits to the provider GREEN ZONE: You are in control  No increase or worsening symptoms  Able to take your medicine  Able to drink and eat    - DO NOT use MyChart messages to report red or yellow symptoms. Allow up to 3    business days for a reply.  -MyChart is for non-urgent medication refills, scheduling requests, or other general questions.         [id number] Rev. 01/28/2022  Approved by Oncology Patient Education Committee        Hospital Outpatient Visit on 04/04/2022   Component Date Value Ref Range Status    ABO Grouping 04/04/2022 O POS   Final    Antibody Screen 04/04/2022 NEG   Final    WBC 04/04/2022 2.1 (L)  3.6 -  11.2 10*9/L Final    RBC 04/04/2022 2.32 (L)  3.95 - 5.13 10*12/L Final    HGB 04/04/2022 7.6 (L)  11.3 - 14.9 g/dL Final    HCT 16/05/9603 21.3 (L)  34.0 - 44.0 % Final    MCV 04/04/2022 91.8  77.6 - 95.7 fL Final    MCH 04/04/2022 32.9 (H)  25.9 - 32.4 pg Final    MCHC 04/04/2022 35.8  32.0 - 36.0 g/dL Final    RDW 54/04/8118 21.2 (H)  12.2 - 15.2 % Final    MPV 04/04/2022 9.0  6.8 - 10.7 fL Final    Platelet 04/04/2022 12 (L)  150 - 450 10*9/L Final    Neutrophils % 04/04/2022 31.4  % Final    Lymphocytes % 04/04/2022 11.8  % Final    Monocytes % 04/04/2022 56.6  % Final    Eosinophils % 04/04/2022 0.2  % Final    Basophils % 04/04/2022 0.0  % Final    Absolute Neutrophils 04/04/2022 0.7 (L)  1.8 - 7.8 10*9/L Final    Absolute Lymphocytes 04/04/2022 0.3 (L)  1.1 - 3.6 10*9/L Final    Absolute Monocytes 04/04/2022 1.2 (H)  0.3 - 0.8 10*9/L Final    Absolute Eosinophils 04/04/2022 0.0  0.0 - 0.5 10*9/L Final    Absolute Basophils 04/04/2022 0.0  0.0 - 0.1 10*9/L Final    Anisocytosis 04/04/2022 Moderate (A)  Not Present Final    Sodium 04/04/2022 138  135 - 145 mmol/L Final    Potassium 04/04/2022 3.8  3.4 - 4.8 mmol/L Final    Chloride 04/04/2022 104  98 - 107 mmol/L Final    CO2 04/04/2022 27.0  20.0 - 31.0 mmol/L Final    Anion Gap 04/04/2022 7  5 - 14 mmol/L Final    BUN 04/04/2022 24 (H)  9 - 23 mg/dL Final    Creatinine 14/78/2956 0.52 (L)  0.60 - 0.80 mg/dL Final    BUN/Creatinine Ratio 04/04/2022 46   Final    eGFR CKD-EPI (2021) Female 04/04/2022 >90  >=60 mL/min/1.20m2 Final    eGFR calculated with CKD-EPI 2021 equation in accordance with [employer] and [employer] of Nephrology Task Force recommendations.    Glucose 04/04/2022 138  70 - 179 mg/dL Final    Calcium 21/30/8657 7.8 (L)  8.7 - 10.4 mg/dL Final    Albumin 84/69/6295 2.9 (L)  3.4 - 5.0 g/dL Final    Total Protein 04/04/2022 4.9 (L)  5.7 - 8.2 g/dL Final    Total Bilirubin 04/04/2022 0.4  0.3 - 1.2 mg/dL Final    AST 28/41/3244 16  <=34 U/L Final    ALT 04/04/2022 55 (H)  10 - 49 U/L Final    Alkaline Phosphatase 04/04/2022 44 (L)  46 - 116 U/L Final    LDH 04/04/2022 222  120 - 246 U/L Final    Crossmatch 04/04/2022 Compatible   Final    Unit Blood Type 04/04/2022 O Pos   Final    ISBT Number 04/04/2022 5100   Final    Unit # 04/04/2022 [id number]   Final    Status 04/04/2022 Issued   Final    Spec Expiration 04/04/2022 [id number]   Final    Product ID 04/04/2022 Red Blood Cells   Final    PRODUCT CODE 04/04/2022 [id number]   Final    Crossmatch 04/04/2022 Compatible   Final    Unit Blood Type 04/04/2022 O Pos   Final    ISBT  Number 04/04/2022 5100   Final    Unit # 04/04/2022 [id number]   Final    Status 04/04/2022 Issued   Final    Spec Expiration 04/04/2022 [id number]   Final    Product ID 04/04/2022 Red Blood Cells   Final    PRODUCT CODE 04/04/2022 [id number]   Final    Smear Review Comments 04/04/2022 See Comment (A)  Undefined Final    [id number]  Slide reviewed.      Toxic Granulation 04/04/2022 Present (A)  Not Present Final    Neutrophil Left Shift 04/04/2022 Present (A)  Not Present Final

## 2022-04-06 ENCOUNTER — Emergency Department: Payer: BC Managed Care – PPO

## 2022-04-06 ENCOUNTER — Emergency Department
Admission: EM | Admit: 2022-04-06 | Discharge: 2022-04-06 | Disposition: A | Discharge status: Home / Self Care | Payer: BC Managed Care – PPO | Attending: Emergency Medicine | Admitting: Emergency Medicine

## 2022-04-06 ENCOUNTER — Encounter: Payer: Self-pay | Admitting: Medical Oncology

## 2022-04-06 DIAGNOSIS — E860 Dehydration: Secondary | ICD-10-CM | POA: Diagnosis not present

## 2022-04-06 DIAGNOSIS — Z20822 Contact with and (suspected) exposure to covid-19: Secondary | ICD-10-CM | POA: Insufficient documentation

## 2022-04-06 DIAGNOSIS — C9101 Acute lymphoblastic leukemia, in remission: Secondary | ICD-10-CM | POA: Diagnosis not present

## 2022-04-06 DIAGNOSIS — J45909 Unspecified asthma, uncomplicated: Secondary | ICD-10-CM | POA: Diagnosis not present

## 2022-04-06 DIAGNOSIS — R509 Fever, unspecified: Secondary | ICD-10-CM | POA: Diagnosis not present

## 2022-04-06 DIAGNOSIS — R0602 Shortness of breath: Secondary | ICD-10-CM | POA: Diagnosis present

## 2022-04-06 DIAGNOSIS — I10 Essential (primary) hypertension: Secondary | ICD-10-CM | POA: Diagnosis not present

## 2022-04-06 LAB — URINALYSIS, COMPLETE (UACMP) WITH MICROSCOPIC
Bilirubin Urine: NEGATIVE
Glucose, UA: NEGATIVE mg/dL
Hgb urine dipstick: NEGATIVE
Ketones, ur: NEGATIVE mg/dL
Leukocytes,Ua: NEGATIVE
Nitrite: NEGATIVE
Protein, ur: NEGATIVE mg/dL
Specific Gravity, Urine: 1.016 (ref 1.005–1.030)
Squamous Epithelial / HPF: NONE SEEN (ref 0–5)
pH: 7 (ref 5.0–8.0)

## 2022-04-06 LAB — CBC WITH DIFFERENTIAL/PLATELET
Abs Immature Granulocytes: 3.74 10*3/uL — ABNORMAL HIGH (ref 0.00–0.07)
Basophils Absolute: 0 10*3/uL (ref 0.0–0.1)
Basophils Relative: 0 %
Eosinophils Absolute: 0 10*3/uL (ref 0.0–0.5)
Eosinophils Relative: 0 %
HCT: 27.6 % — ABNORMAL LOW (ref 36.0–46.0)
Hemoglobin: 9.1 g/dL — ABNORMAL LOW (ref 12.0–15.0)
Immature Granulocytes: 32 %
Lymphocytes Relative: 2 %
Lymphs Abs: 0.2 10*3/uL — ABNORMAL LOW (ref 0.7–4.0)
MCH: 30.7 pg (ref 26.0–34.0)
MCHC: 33 g/dL (ref 30.0–36.0)
MCV: 93.2 fL (ref 80.0–100.0)
Monocytes Absolute: 1.8 10*3/uL — ABNORMAL HIGH (ref 0.1–1.0)
Monocytes Relative: 15 %
Neutro Abs: 5.9 10*3/uL (ref 1.7–7.7)
Neutrophils Relative %: 51 %
Platelets: 23 10*3/uL — CL (ref 150–400)
RBC: 2.96 MIL/uL — ABNORMAL LOW (ref 3.87–5.11)
RDW: 17.6 % — ABNORMAL HIGH (ref 11.5–15.5)
Smear Review: NORMAL
WBC: 11.6 10*3/uL — ABNORMAL HIGH (ref 4.0–10.5)
nRBC: 0.8 % — ABNORMAL HIGH (ref 0.0–0.2)

## 2022-04-06 LAB — COMPREHENSIVE METABOLIC PANEL
ALT: 43 U/L (ref 0–44)
AST: 34 U/L (ref 15–41)
Albumin: 3 g/dL — ABNORMAL LOW (ref 3.5–5.0)
Alkaline Phosphatase: 47 U/L (ref 38–126)
Anion gap: 6 (ref 5–15)
BUN: 12 mg/dL (ref 6–20)
CO2: 23 mmol/L (ref 22–32)
Calcium: 8 mg/dL — ABNORMAL LOW (ref 8.9–10.3)
Chloride: 107 mmol/L (ref 98–111)
Creatinine, Ser: 0.68 mg/dL (ref 0.44–1.00)
GFR, Estimated: >60 mL/min (ref >60)
Glucose, Bld: 155 mg/dL — ABNORMAL HIGH (ref 70–99)
Potassium: 3.7 mmol/L (ref 3.5–5.1)
Sodium: 136 mmol/L (ref 135–145)
Total Bilirubin: 0.3 mg/dL (ref 0.3–1.2)
Total Protein: 5 g/dL — ABNORMAL LOW (ref 6.5–8.1)

## 2022-04-06 LAB — TYPE AND SCREEN
ABO/RH(D): O POS
Antibody Screen: NEGATIVE

## 2022-04-06 LAB — PROTIME-INR
INR: 1.1 (ref 0.8–1.2)
Prothrombin Time: 14 seconds (ref 11.4–15.2)

## 2022-04-06 LAB — TSH: TSH: 3.167 u[IU]/mL (ref 0.350–4.500)

## 2022-04-06 LAB — RESP PANEL BY RT-PCR (FLU A&B, COVID) ARPGX2
Influenza A by PCR: NEGATIVE
Influenza B by PCR: NEGATIVE
SARS Coronavirus 2 by RT PCR: NEGATIVE

## 2022-04-06 LAB — LACTIC ACID, PLASMA
Lactic Acid, Venous: 3.3 mmol/L (ref 0.5–1.9)
Lactic Acid, Venous: 1.7 mmol/L (ref 0.5–1.9)

## 2022-04-06 LAB — APTT: aPTT: 27 seconds (ref 24–36)

## 2022-04-06 LAB — LIPASE, BLOOD: Lipase: 29 U/L (ref 11–51)

## 2022-04-06 MED ORDER — HEPARIN SOD (PORK) LOCK FLUSH 100 UNIT/ML IV SOLN
500.0000 [IU] | Freq: Once | INTRAVENOUS | Status: AC
  Administered 2022-04-06: 500 [IU] via INTRAVENOUS
  Filled 2022-04-06: qty 5

## 2022-04-06 MED ORDER — IOHEXOL 350 MG/ML SOLN
75.0000 mL | Freq: Once | INTRAVENOUS | Status: AC | PRN
  Administered 2022-04-06: 75 mL via INTRAVENOUS

## 2022-04-06 MED ORDER — SODIUM CHLORIDE 0.9 % IV SOLN
2.0000 g | Freq: Three times a day (TID) | INTRAVENOUS | Status: DC
  Administered 2022-04-06: 2 g via INTRAVENOUS
  Filled 2022-04-06: qty 12.5

## 2022-04-06 MED ORDER — LACTATED RINGERS IV BOLUS
1000.0000 mL | Freq: Once | INTRAVENOUS | Status: AC
  Administered 2022-04-06: 1000 mL via INTRAVENOUS

## 2022-04-06 NOTE — ED Triage Notes (Signed)
Pt is from home via POV with reports of fever that began last night. Fever of 100.4. Pt is ALL pt currently receiving treatment. Pt reports body aches. Last week also had neutropenic fever.

## 2022-04-06 NOTE — ED Provider Notes (Signed)
[facility] Provider Note    Event Date/Time   First MD Initiated Contact with Patient 04/06/22 0730     (approximate)   History   Chief Complaint: Fever   HPI  Kathryn Black is a 46 y.o. female with a history of B-cell AL L, receiving chemotherapy through [facility], hypertension, asthma who comes the ED complaining of fever of 100.4 since last night.  Recently had chemotherapy infusion 5 of 8 planned on about August 24.  She subsequently developed neutropenic fever and was admitted at [facility].  She reports she was discharged home 1 week ago.  She also endorses mild shortness of breath and pleuritic chest discomfort.  No history of DVT or PE.  Not on anticoagulation.     Physical Exam   Triage Vital Signs: ED Triage Vitals  Enc Vitals Group     BP 04/06/22 0717 103/82     Pulse Rate 04/06/22 0717 (!) 123     Resp 04/06/22 0717 18     Temp 04/06/22 0717 98.9 F (37.2 C)     Temp Source 04/06/22 0717 Oral     SpO2 04/06/22 0717 90 %     Weight 04/06/22 0718 180 lb (81.6 kg)     Height 04/06/22 0718 '5\' 6"'$  (1.676 m)     Head Circumference --      Peak Flow --      Pain Score 04/06/22 0717 9     Pain Loc --      Pain Edu? --      Excl. in [place]? --     Most recent vital signs: Vitals:   04/06/22 0830 04/06/22 1100  BP: 110/72 122/78  Pulse: (!) 102 (!) 107  Resp: 18 16  Temp:  98.8 F (37.1 C)  SpO2: 96% 99%    General: Awake, no distress.  CV:  Good peripheral perfusion.  Tachycardia heart rate 120 Resp:  Normal effort.  Clear to auscultation bilaterally Abd:  No distention.  Soft nontender Other:  No lower extremity edema, no rash, no thrush.   ED Results / Procedures / Treatments   Labs (all labs ordered are listed, but only abnormal results are displayed) Labs Reviewed  LACTIC ACID, PLASMA - Abnormal; Notable for the following components:      Result Value   Lactic Acid, Venous 3.3 (*)    All other components within normal limits   COMPREHENSIVE METABOLIC PANEL - Abnormal; Notable for the following components:   Glucose, Bld 155 (*)    Calcium 8.0 (*)    Total Protein 5.0 (*)    Albumin 3.0 (*)    All other components within normal limits  CBC WITH DIFFERENTIAL/PLATELET - Abnormal; Notable for the following components:   WBC 11.6 (*)    RBC 2.96 (*)    Hemoglobin 9.1 (*)    HCT 27.6 (*)    RDW 17.6 (*)    Platelets 23 (*)    nRBC 0.8 (*)    Lymphs Abs 0.2 (*)    Monocytes Absolute 1.8 (*)    Abs Immature Granulocytes 3.74 (*)    All other components within normal limits  URINALYSIS, COMPLETE (UACMP) WITH MICROSCOPIC - Abnormal; Notable for the following components:   Color, Urine YELLOW (*)    APPearance CLEAR (*)    Bacteria, UA RARE (*)    All other components within normal limits  RESP PANEL BY RT-PCR (FLU A&B, COVID) ARPGX2  CULTURE, BLOOD (ROUTINE X 2)  CULTURE, BLOOD (ROUTINE X 2)  URINE CULTURE  LACTIC ACID, PLASMA  PROTIME-INR  APTT  LIPASE, BLOOD  TSH  POC URINE PREG, ED  TYPE AND SCREEN     EKG    RADIOLOGY Chest x-ray interpreted by me, appears normal.  Radiology report reviewed.  CT angiogram chest negative for PE or other acute findings.   PROCEDURES:  .Critical Care  Performed by: Carrie Mew, MD Authorized by: Carrie Mew, MD   Critical care provider statement:    Critical care time (minutes):  35   Critical care time was exclusive of:  Separately billable procedures and treating other patients   Critical care was necessary to treat or prevent imminent or life-threatening deterioration of the following conditions:  Sepsis   Critical care was time spent personally by me on the following activities:  Development of treatment plan with patient or surrogate, discussions with consultants, evaluation of patient's response to treatment, examination of patient, obtaining history from patient or surrogate, ordering and performing treatments and interventions, ordering  and review of laboratory studies, ordering and review of radiographic studies, pulse oximetry, re-evaluation of patient's condition and review of old [place] ED: Medications  ceFEPIme (MAXIPIME) 2 g in sodium chloride 0.9 % 100 mL IVPB (0 g Intravenous Stopped 04/06/22 0852)  lactated ringers bolus 1,000 mL (1,000 mLs Intravenous New Bag/Given 04/06/22 0924)  iohexol (OMNIPAQUE) 350 MG/ML injection 75 mL (75 mLs Intravenous Contrast Given 04/06/22 1001)     IMPRESSION / MDM / [place] / ED COURSE  I reviewed the triage vital signs and the nursing notes.                              Differential diagnosis includes, but is not limited to, bacteremia, pneumonia, UTI, sepsis, pulmonary embolism, dehydration, electrolyte abnormality, AKI, thrombocytopenia, anemia, viral illness  Patient's presentation is most consistent with acute presentation with potential threat to life or bodily function.  Patient presents with fever, tachycardia in the setting of ongoing chemotherapy for AL L.  She is at elevated risk for opportunistic infection and sepsis as well as pulmonary embolism.  We will obtain blood cultures and sepsis panel, give IV cefepime.  If no obvious source of fever is found, will need to obtain CT chest to evaluate for PE.   Clinical Course as of 04/06/22 1218  Wed Apr 06, 2022  1041 Platelets(!!): 23 Plt was 12 2 days ago from [facility] system. Chronically low. No bleeding complications.  [PS]  9024 Workup reassuring. No neutropenia currently. Will obtain UA and plan for DC [PS]    Clinical Course User Index [PS] Carrie Mew, MD    ----------------------------------------- 12:19 PM on 04/06/2022 ----------------------------------------- Patient feeling better after IV fluids.  Lactate normalized.  Tachycardia improved.  I think her presentation is due to dehydration from chemo related diarrhea.  Possibly some side effects of immune reconstitution  as well due to white cell boosting medications that have increased her white blood cell count from 2000 to 12,000 within a few days.  She is nontoxic, not septic, reassuring work-up.  Cultures pending.  Stable for discharge.  She has an appointment with her hematologist tomorrow which she will keep.   FINAL CLINICAL IMPRESSION(S) / ED DIAGNOSES   Final diagnoses:  Dehydration  ALL (acute lymphoid leukemia) in remission [facility])     Rx / DC Orders   ED Discharge Orders  None        Note:  This document was prepared using Dragon voice recognition software and may include unintentional dictation errors.   Carrie Mew, MD 04/06/22 1220

## 2022-04-07 ENCOUNTER — Encounter: Admit: 2022-04-07 | Discharge: 2022-04-07 | Payer: PRIVATE HEALTH INSURANCE

## 2022-04-07 ENCOUNTER — Other Ambulatory Visit: Admit: 2022-04-07 | Discharge: 2022-04-07 | Payer: PRIVATE HEALTH INSURANCE

## 2022-04-07 ENCOUNTER — Ambulatory Visit: Admit: 2022-04-07 | Discharge: 2022-04-07 | Payer: PRIVATE HEALTH INSURANCE

## 2022-04-07 ENCOUNTER — Ambulatory Visit
Admit: 2022-04-07 | Discharge: 2022-04-07 | Payer: PRIVATE HEALTH INSURANCE | Attending: Adult Health | Primary: Adult Health

## 2022-04-07 DIAGNOSIS — C9100 Acute lymphoblastic leukemia not having achieved remission: Principal | ICD-10-CM

## 2022-04-07 DIAGNOSIS — R197 Diarrhea, unspecified: Principal | ICD-10-CM

## 2022-04-07 LAB — CBC W/ AUTO DIFF
BASOPHILS ABSOLUTE COUNT: 0 10*9/L (ref 0.0–0.1)
BASOPHILS RELATIVE PERCENT: 0.1 %
EOSINOPHILS ABSOLUTE COUNT: 0 10*9/L (ref 0.0–0.5)
EOSINOPHILS RELATIVE PERCENT: 0 %
HEMATOCRIT: 26.7 % — ABNORMAL LOW (ref 34.0–44.0)
HEMOGLOBIN: 9.2 g/dL — ABNORMAL LOW (ref 11.3–14.9)
LYMPHOCYTES ABSOLUTE COUNT: 0.3 10*9/L — ABNORMAL LOW (ref 1.1–3.6)
LYMPHOCYTES RELATIVE PERCENT: 1.9 %
MEAN CORPUSCULAR HEMOGLOBIN CONC: 34.4 g/dL (ref 32.0–36.0)
MEAN CORPUSCULAR HEMOGLOBIN: 31.6 pg (ref 25.9–32.4)
MEAN CORPUSCULAR VOLUME: 91.7 fL (ref 77.6–95.7)
MEAN PLATELET VOLUME: 9.6 fL (ref 6.8–10.7)
MONOCYTES ABSOLUTE COUNT: 1.9 10*9/L — ABNORMAL HIGH (ref 0.3–0.8)
MONOCYTES RELATIVE PERCENT: 11.5 %
NEUTROPHILS ABSOLUTE COUNT: 14.1 10*9/L — ABNORMAL HIGH (ref 1.8–7.8)
NEUTROPHILS RELATIVE PERCENT: 86.5 %
PLATELET COUNT: 31 10*9/L — ABNORMAL LOW (ref 150–450)
RED BLOOD CELL COUNT: 2.92 10*12/L — ABNORMAL LOW (ref 3.95–5.13)
RED CELL DISTRIBUTION WIDTH: 18.7 % — ABNORMAL HIGH (ref 12.2–15.2)
WBC ADJUSTED: 16.3 10*9/L — ABNORMAL HIGH (ref 3.6–11.2)

## 2022-04-07 LAB — SLIDE REVIEW

## 2022-04-07 LAB — COMPREHENSIVE METABOLIC PANEL
ALBUMIN: 3 g/dL — ABNORMAL LOW (ref 3.4–5.0)
ALKALINE PHOSPHATASE: 76 U/L (ref 46–116)
ALT (SGPT): 69 U/L — ABNORMAL HIGH (ref 10–49)
ANION GAP: 8 mmol/L (ref 5–14)
AST (SGOT): 44 U/L — ABNORMAL HIGH (ref <=34)
BILIRUBIN TOTAL: 0.3 mg/dL (ref 0.3–1.2)
BLOOD UREA NITROGEN: 11 mg/dL (ref 9–23)
BUN / CREAT RATIO: 20
CALCIUM: 8 mg/dL — ABNORMAL LOW (ref 8.7–10.4)
CHLORIDE: 105 mmol/L (ref 98–107)
CO2: 24 mmol/L (ref 20.0–31.0)
CREATININE: 0.56 mg/dL — ABNORMAL LOW
EGFR CKD-EPI (2021) FEMALE: >90 mL/min/{1.73_m2} (ref >=60)
GLUCOSE RANDOM: 140 mg/dL (ref 70–179)
POTASSIUM: 4.1 mmol/L (ref 3.4–4.8)
PROTEIN TOTAL: 5.1 g/dL — ABNORMAL LOW (ref 5.7–8.2)
SODIUM: 137 mmol/L (ref 135–145)

## 2022-04-07 LAB — LACTATE DEHYDROGENASE: LACTATE DEHYDROGENASE: 562 U/L — ABNORMAL HIGH (ref 120–246)

## 2022-04-07 MED ORDER — DOXYCYCLINE HYCLATE 100 MG TABLET
ORAL_TABLET | Freq: Two times a day (BID) | ORAL | 0 refills | 10 days | Status: CP
Start: 2022-04-07 — End: 2022-04-17

## 2022-04-07 MED ORDER — AMOXICILLIN 875 MG-POTASSIUM CLAVULANATE 125 MG TABLET
ORAL_TABLET | Freq: Two times a day (BID) | ORAL | 0 refills | 14 days | Status: CP
Start: 2022-04-07 — End: 2022-04-21

## 2022-04-07 MED ORDER — CETIRIZINE 10 MG TABLET
ORAL_TABLET | Freq: Every evening | ORAL | 2 refills | 30 days
Start: 2022-04-07 — End: 2023-04-07

## 2022-04-07 MED ADMIN — leuprolide (LUPRON) 11.25 mg injection: INTRAMUSCULAR | @ 17:00:00 | Stop: 2022-04-07

## 2022-04-07 MED ADMIN — leuprolide (LUPRON) injection 11.25 mg: 11.25 mg | INTRAMUSCULAR | @ 17:00:00 | Stop: 2022-04-07

## 2022-04-07 NOTE — Unmapped (Signed)
I will update you after the c diff results are back.    Please start taking augmentin and doxycycline to treat other possible infectious causes of your fevers.    If you or your family notice confusion or if you develop new/concerning symptoms then you should go to your closest emergency room.    We will give you a couple of weeks to recover and then will see you back to discuss next steps.

## 2022-04-07 NOTE — Unmapped (Unsigned)
Pt received shot as ordered. Pt tolerated well. Education was given to pt.

## 2022-04-07 NOTE — Unmapped (Signed)
Patient called in and changed the delivery date to 9/9 via next day courier.

## 2022-04-07 NOTE — Unmapped (Unsigned)
Patient educated on various topics, clinical references attached to patient's MyChart and AVS. Time spent 2 minutes.

## 2022-04-08 DIAGNOSIS — N3000 Acute cystitis without hematuria: Principal | ICD-10-CM

## 2022-04-08 DIAGNOSIS — C9100 Acute lymphoblastic leukemia not having achieved remission: Principal | ICD-10-CM

## 2022-04-08 LAB — URINE CULTURE: Culture: 10000 — AB

## 2022-04-08 MED ORDER — CETIRIZINE 10 MG TABLET
ORAL_TABLET | Freq: Every evening | ORAL | 2 refills | 30 days | Status: CP
Start: 2022-04-08 — End: 2023-04-08

## 2022-04-08 MED ORDER — NITROFURANTOIN MONOHYDRATE/MACROCRYSTALS 100 MG CAPSULE
ORAL_CAPSULE | Freq: Two times a day (BID) | ORAL | 0 refills | 7 days | Status: CP
Start: 2022-04-08 — End: 2022-04-15

## 2022-04-08 MED ORDER — LOPERAMIDE 2 MG TABLET
ORAL_TABLET | Freq: Four times a day (QID) | ORAL | 1 refills | 30 days | Status: CP | PRN
Start: 2022-04-08 — End: ?

## 2022-04-08 MED FILL — SPRYCEL 70 MG TABLET: ORAL | 30 days supply | Qty: 30 | Fill #1

## 2022-04-09 NOTE — Progress Notes (Signed)
ED Antimicrobial Stewardship Positive Culture Follow Up   Kathryn Black is an 46 y.o. female who presented to [facility] on 04/06/2022 with PMH significant for HTN, asthma, B-cell ALL on chemo. Patient was recently discharged from [facility] (~1 week ago) after treatment for neutropenic fever. Patient presented to [facility] with complaints of fever, tachycardia in the setting of ongoing chemotherapy for ALL. She was given IV fluids and showed signs of recovery. She was discharged with plan to follow up with hematologist at [facility] on 9/7.  Chief Complaint  Patient presents with   Fever    Recent Results (from the past 720 hour(s))  Resp Panel by RT-PCR (Flu A&B, Covid) Anterior Nasal Swab     Status: None   Collection Time: 04/06/22  8:00 AM   Specimen: Anterior Nasal Swab  Result Value Ref Range Status   SARS Coronavirus 2 by RT PCR NEGATIVE NEGATIVE Final    Comment: (NOTE) SARS-CoV-2 target nucleic acids are NOT DETECTED.  The SARS-CoV-2 RNA is generally detectable in upper respiratory specimens during the acute phase of infection. The lowest concentration of SARS-CoV-2 viral copies this assay can detect is 138 copies/mL. A negative result does not preclude SARS-Cov-2 infection and should not be used as the sole basis for treatment or other patient management decisions. A negative result may occur with  improper specimen collection/handling, submission of specimen other than nasopharyngeal swab, presence of viral mutation(s) within the areas targeted by this assay, and inadequate number of viral copies(<138 copies/mL). A negative result must be combined with clinical observations, patient history, and epidemiological information. The expected result is Negative.  Fact Sheet for Patients:  EntrepreneurPulse.com.au  Fact Sheet for Healthcare Providers:  IncredibleEmployment.be  This test is no t yet approved or cleared by the [place] FDA and  has been  authorized for detection and/or diagnosis of SARS-CoV-2 by FDA under an Emergency Use Authorization (EUA). This EUA will remain  in effect (meaning this test can be used) for the duration of the COVID-19 declaration under Section 564(b)(1) of the Act, 21 U.S.C.section 360bbb-3(b)(1), unless the authorization is terminated  or revoked sooner.       Influenza A by PCR NEGATIVE NEGATIVE Final   Influenza B by PCR NEGATIVE NEGATIVE Final    Comment: (NOTE) The Xpert Xpress SARS-CoV-2/FLU/RSV plus assay is intended as an aid in the diagnosis of influenza from Nasopharyngeal swab specimens and should not be used as a sole basis for treatment. Nasal washings and aspirates are unacceptable for Xpert Xpress SARS-CoV-2/FLU/RSV testing.  Fact Sheet for Patients: EntrepreneurPulse.com.au  Fact Sheet for Healthcare Providers: IncredibleEmployment.be  This test is not yet approved or cleared by the [place] FDA and has been authorized for detection and/or diagnosis of SARS-CoV-2 by FDA under an Emergency Use Authorization (EUA). This EUA will remain in effect (meaning this test can be used) for the duration of the COVID-19 declaration under Section 564(b)(1) of the Act, 21 U.S.C. section 360bbb-3(b)(1), unless the authorization is terminated or revoked.  Performed at [facility], [address] [place], [place] [address]   Blood Culture (routine x 2)     Status: None (Preliminary result)   Collection Time: 04/06/22  8:17 AM   Specimen: BLOOD  Result Value Ref Range Status   Specimen Description BLOOD LEFT ANTECUBITAL  Final   Special Requests   Final    BOTTLES DRAWN AEROBIC AND ANAEROBIC Blood Culture adequate volume   Culture   Final    NO  GROWTH 3 DAYS Performed at [facility], [address] [place], [place] [address]    Report Status PENDING  Incomplete  Blood Culture (routine x 2)     Status: None (Preliminary  result)   Collection Time: 04/06/22  8:17 AM   Specimen: BLOOD  Result Value Ref Range Status   Specimen Description BLOOD BLOOD RIGHT HAND  Final   Special Requests   Final    BOTTLES DRAWN AEROBIC AND ANAEROBIC Blood Culture adequate volume   Culture   Final    NO GROWTH 3 DAYS Performed at [facility], [address] [place], [place] [address]    Report Status PENDING  Incomplete  Urine Culture     Status: Abnormal   Collection Time: 04/06/22  8:17 AM   Specimen: In/Out Cath Urine  Result Value Ref Range Status   Specimen Description   Final    IN/OUT CATH URINE Performed at [facility], [address] [place], [place] [address]    Special Requests   Final    NONE Performed at [facility], [address] [place], [place] [address]    Culture 10,000 COLONIES/mL ENTEROCOCCUS FAECALIS (A)  Final   Report Status 04/08/2022 FINAL  Final   Organism ID, Bacteria ENTEROCOCCUS FAECALIS (A)  Final      Susceptibility   Enterococcus faecalis - MIC*    AMPICILLIN <=2 SENSITIVE Sensitive     NITROFURANTOIN <=16 SENSITIVE Sensitive     VANCOMYCIN 1 SENSITIVE Sensitive     * 10,000 COLONIES/mL ENTEROCOCCUS FAECALIS   '[x]'$  Patient discharged originally without antimicrobial agent and treatment is now indicated  New antibiotic prescription: Per note from [facility] hematology on 9/8, patient was prescribed Nitrofurantoin 100 mg BID x7 days which is appropriate given culture results and clinical picture.  ED Provider: Carrie Mew, MD   Gretel Acre, PharmD PGY1 Pharmacy Resident 04/09/2022 8:15 AM

## 2022-04-11 ENCOUNTER — Encounter: Admit: 2022-04-11 | Discharge: 2022-04-12 | Payer: PRIVATE HEALTH INSURANCE

## 2022-04-11 ENCOUNTER — Other Ambulatory Visit: Admit: 2022-04-11 | Discharge: 2022-04-12 | Payer: PRIVATE HEALTH INSURANCE

## 2022-04-11 ENCOUNTER — Ambulatory Visit: Admit: 2022-04-11 | Discharge: 2022-04-12 | Payer: PRIVATE HEALTH INSURANCE

## 2022-04-11 DIAGNOSIS — C9100 Acute lymphoblastic leukemia not having achieved remission: Principal | ICD-10-CM

## 2022-04-11 LAB — CBC W/ AUTO DIFF
BASOPHILS ABSOLUTE COUNT: 0 10*9/L (ref 0.0–0.1)
BASOPHILS RELATIVE PERCENT: 0.3 %
EOSINOPHILS ABSOLUTE COUNT: 0 10*9/L (ref 0.0–0.5)
EOSINOPHILS RELATIVE PERCENT: 0 %
HEMATOCRIT: 22.3 % — ABNORMAL LOW (ref 34.0–44.0)
HEMOGLOBIN: 7.4 g/dL — ABNORMAL LOW (ref 11.3–14.9)
LYMPHOCYTES ABSOLUTE COUNT: 0.5 10*9/L — ABNORMAL LOW (ref 1.1–3.6)
LYMPHOCYTES RELATIVE PERCENT: 7.7 %
MEAN CORPUSCULAR HEMOGLOBIN CONC: 33.2 g/dL (ref 32.0–36.0)
MEAN CORPUSCULAR HEMOGLOBIN: 31.4 pg (ref 25.9–32.4)
MEAN CORPUSCULAR VOLUME: 94.7 fL (ref 77.6–95.7)
MEAN PLATELET VOLUME: 8.6 fL (ref 6.8–10.7)
MONOCYTES ABSOLUTE COUNT: 0.9 10*9/L — ABNORMAL HIGH (ref 0.3–0.8)
MONOCYTES RELATIVE PERCENT: 13.9 %
NEUTROPHILS ABSOLUTE COUNT: 5.2 10*9/L (ref 1.8–7.8)
NEUTROPHILS RELATIVE PERCENT: 78.1 %
PLATELET COUNT: 79 10*9/L — ABNORMAL LOW (ref 150–450)
RED BLOOD CELL COUNT: 2.36 10*12/L — ABNORMAL LOW (ref 3.95–5.13)
RED CELL DISTRIBUTION WIDTH: 19.3 % — ABNORMAL HIGH (ref 12.2–15.2)
WBC ADJUSTED: 6.6 10*9/L (ref 3.6–11.2)

## 2022-04-11 LAB — COMPREHENSIVE METABOLIC PANEL
ALBUMIN: 3 g/dL — ABNORMAL LOW (ref 3.4–5.0)
ALKALINE PHOSPHATASE: 78 U/L (ref 46–116)
ALT (SGPT): 123 U/L — ABNORMAL HIGH (ref 10–49)
ANION GAP: 8 mmol/L (ref 5–14)
AST (SGOT): 60 U/L — ABNORMAL HIGH (ref <=34)
BILIRUBIN TOTAL: 0.5 mg/dL (ref 0.3–1.2)
BLOOD UREA NITROGEN: 14 mg/dL (ref 9–23)
BUN / CREAT RATIO: 24
CALCIUM: 8.4 mg/dL — ABNORMAL LOW (ref 8.7–10.4)
CHLORIDE: 103 mmol/L (ref 98–107)
CO2: 26 mmol/L (ref 20.0–31.0)
CREATININE: 0.58 mg/dL — ABNORMAL LOW
EGFR CKD-EPI (2021) FEMALE: >90 mL/min/{1.73_m2} (ref >=60)
GLUCOSE RANDOM: 188 mg/dL — ABNORMAL HIGH (ref 70–179)
POTASSIUM: 3.8 mmol/L (ref 3.4–4.8)
PROTEIN TOTAL: 5.2 g/dL — ABNORMAL LOW (ref 5.7–8.2)
SODIUM: 137 mmol/L (ref 135–145)

## 2022-04-11 LAB — CULTURE, BLOOD (ROUTINE X 2)
Culture: NO GROWTH
Culture: NO GROWTH
Special Requests: ADEQUATE
Special Requests: ADEQUATE

## 2022-04-11 LAB — URINALYSIS WITH MICROSCOPY
BILIRUBIN UA: NEGATIVE
BLOOD UA: NEGATIVE
GLUCOSE UA: NEGATIVE
KETONES UA: NEGATIVE
LEUKOCYTE ESTERASE UA: NEGATIVE
NITRITE UA: NEGATIVE
PH UA: 7.5 (ref 5.0–9.0)
PROTEIN UA: NEGATIVE
RBC UA: <1 /HPF (ref <=4)
SPECIFIC GRAVITY UA: 1.011 (ref 1.003–1.030)
SQUAMOUS EPITHELIAL: <1 /HPF (ref 0–5)
UROBILINOGEN UA: <2
WBC UA: 3 /HPF (ref 0–5)

## 2022-04-11 LAB — LACTATE DEHYDROGENASE: LACTATE DEHYDROGENASE: 579 U/L — ABNORMAL HIGH (ref 120–246)

## 2022-04-11 NOTE — Unmapped (Unsigned)
Port accessed by [employer], labs drawn and sent.

## 2022-04-11 NOTE — Unmapped (Unsigned)
Patient is here for 2 units of RBC's. Hgb is 7.4 Port is accessed with good blood return. Patients first unit arrived and was hung at 1655. Patient is tolerating the rate of infusion, is running low grade temp of 99.6, states has an UTI infection and is currently on antibiotics for this. Report given to Maryanna Shape RN

## 2022-04-12 MED ADMIN — acetaminophen (TYLENOL) tablet 650 mg: 650 mg | ORAL | Stop: 2022-04-11

## 2022-04-12 MED ADMIN — heparin, porcine (PF) 100 unit/mL injection 500 Units: 500 [IU] | INTRAVENOUS | Stop: 2022-04-12

## 2022-04-12 NOTE — Unmapped (Signed)
TRANSFUSION REACTION INTERPRETATION    Interpretation:  Due to lack of clerical errors as well as a negative direct antiglobulin test and no evidence of hemolysis, there was no incompatibility of the irradiated, leukoreduced apheresis red blood cell unit. The symptom of increased temperature is most consistent with symptoms of febrile non-hemolytic transfusion reaction. Cannot entirely rule out underlying medical condition (UTI) as a cause or contributor to the fever.    Recommendations:  1. Ok to transfuse as clinically indicated  2. Consider premedication with an antipyretics prior to future transfusions  3. Call the [facility] if you have any questions or concerns at [phone]    Transfusion Medicine Attending Attestation:  I reviewed the patient's chart and transfusion medicine laboratory evaluation.  I agree with the above findings, which are most consistent with a febrile nonhemolytic transfusion reaction, and with the resident's recommendations.      Silas Sacramento, MD MS  [facility] Transfusion Medicine Service     Clinical History:  The patient is a 46 y.o. female with diagnosis of B-cell acute lymphoblastic leukemia.  A unit of leukoreduced, irradiated red blood cells was ordered for her chronic transfusion regimen (pre-transfusion hemoglobin: 7.4g/dL).    The patient had a urine culture from [facility] that showed Enterococcus faecalis from 9/7. She was initially treated broadly with Augmentin+doxycycline on 9/7 but switched to Macrobid on 9/8.     Prior to starting her transfusion on 9/11, she arrived with a temperature of 99.6 but made the nurse aware of her UTI and current antibiotic treatment. During transfusion of her first unit of RBCs, the patient continued to have temps in the high 99's. The APP was notified and assessed the patient's temperature, which was 100.32F, before giving permission to start 2nd unit of RBCs.     Transfusion Event:  On 04/11/22 at 1800, the patient was transfused with her second unit (2/2) of leukoreduced, irradiated red blood cells. She received no premedication. After receiving the entire unit (317.5 mL) at 1900 hrs, the patient had an increase in temperature. The transfusion was stopped at 1900 hrs and transfusion workup was ordered by APP. The patient was given tylenol and temperature was rechecked at 2017 and was 101F. The patient then went home with her significant other.    Vitals Pre-transfusion:  Vitals Post-transfusion:    Temperature: 37.7 C Temperature: 38.7 C   Pulse: 108 bpm Pulse: 101 bpm   Blood Pressure: 122/84 mmHg Blood Pressure: 135/98 mmHg   Respirations: 18 rpm Respirations: 18 rpm     The patient did not have chills, hives, SOB, or back pain.    Work-Up:  Clerical check confirmed the patient's identity, which matched the labels of the unit without any discrepancies. The product was sent for culture, results are pending. The serum of the pre- and post- transfusion samples are clear yellow in color, indicating a lack of hemolysis. The direct antiglobulin test is negative. The patient's ABO/Rh type is re-checked, which is confirmed as O+ on front and back typing. There is no evidence of incompatibility between the donor and the recipient.      Post transfusion labs: Peripheral blood cultures: in progress, urine culture: in progress, UA: rare bacteria, negative for hemoglobinuria    Marolyn Hammock, MD  Pathology Resident, PGY1  Pager: [phone]

## 2022-04-12 NOTE — Unmapped (Signed)
Assumed care of pt.  Primary nurse obtained specimens for septic work-up for elevated temp.  Primary nurse also completed last unit of PRBC's. However, APP did transfusion rxn work up for elevated temp prior to discharge.  Pt given Tylenol and re-checked.  Temp is 101F at 2017.  Port flushed and de-accessed.  Pt left  via wheelchair with significant other.

## 2022-04-12 NOTE — Unmapped (Unsigned)
Report received from [place], [place]. Pt currently receiving Unit 1 of 2 pRBC's. Pt continued to have temps in the high 99's. APP made aware. APP to chairside to assess;  temperature was 100.66F. Permission was given to start 2nd unit of pRBCs. New order for UA to culture and blood cultures received. Blood cultures were drawn from bilateral peripheral lab draws. These were sent. Report given to [facility].

## 2022-04-12 NOTE — Unmapped (Incomplete)
Brief Adult Oncology Infusion Clinic Note:                        Leeroy Cha, FNP

## 2022-04-14 ENCOUNTER — Ambulatory Visit: Admit: 2022-04-14 | Discharge: 2022-04-15 | Payer: PRIVATE HEALTH INSURANCE

## 2022-04-14 ENCOUNTER — Encounter: Admit: 2022-04-14 | Discharge: 2022-04-15 | Payer: PRIVATE HEALTH INSURANCE

## 2022-04-14 LAB — CBC W/ AUTO DIFF
BASOPHILS ABSOLUTE COUNT: 0 10*9/L (ref 0.0–0.1)
BASOPHILS RELATIVE PERCENT: 0.4 %
EOSINOPHILS ABSOLUTE COUNT: 0 10*9/L (ref 0.0–0.5)
EOSINOPHILS RELATIVE PERCENT: 0 %
HEMATOCRIT: 28.4 % — ABNORMAL LOW (ref 34.0–44.0)
HEMOGLOBIN: 9.9 g/dL — ABNORMAL LOW (ref 11.3–14.9)
LYMPHOCYTES ABSOLUTE COUNT: 0.7 10*9/L — ABNORMAL LOW (ref 1.1–3.6)
LYMPHOCYTES RELATIVE PERCENT: 14.2 %
MEAN CORPUSCULAR HEMOGLOBIN CONC: 35 g/dL (ref 32.0–36.0)
MEAN CORPUSCULAR HEMOGLOBIN: 32.1 pg (ref 25.9–32.4)
MEAN CORPUSCULAR VOLUME: 91.8 fL (ref 77.6–95.7)
MEAN PLATELET VOLUME: 7.9 fL (ref 6.8–10.7)
MONOCYTES ABSOLUTE COUNT: 0.6 10*9/L (ref 0.3–0.8)
MONOCYTES RELATIVE PERCENT: 13.9 %
NEUTROPHILS ABSOLUTE COUNT: 3.3 10*9/L (ref 1.8–7.8)
NEUTROPHILS RELATIVE PERCENT: 71.5 %
PLATELET COUNT: 95 10*9/L — ABNORMAL LOW (ref 150–450)
RED BLOOD CELL COUNT: 3.09 10*12/L — ABNORMAL LOW (ref 3.95–5.13)
RED CELL DISTRIBUTION WIDTH: 16.8 % — ABNORMAL HIGH (ref 12.2–15.2)
WBC ADJUSTED: 4.6 10*9/L (ref 3.6–11.2)

## 2022-04-14 NOTE — Unmapped (Addendum)
Brief Adult Oncology Infusion Clinic Note:    Paged regarding: Patient request to speak with infusion provider.     Patricia [name] presents to the infusion clinic for lab check and evaluation of transfusion needs. 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 She has had ongoing fevers and workups completed. Recently treated for UTI. Tmax in clinic 37.7 on arrival. Patient inquires about transfusion process given history of reaction last visit. She is resting well in the infusion chair in NAD and nontoxic appearing.     Hgb=9.9 today and no indication for transfusion today. Discussed with Dellis Anes, NP and okay to administer Tylenol 650 mg prior to transfusions given patient is otherwise stable and not neutropenic. I have added pre-medications of Tylenol and Zyrtec to her transfusion plan for future visits given history of transfusion reaction. Patient notified to continue to monitor her temperature and take antipyretics PRN per Dellis Anes, NP. Primary oncology team to follow up with patient next week as scheduled on 04/19/2022. Patient and caregiver verbalize understanding of the information provided today.           Montel Culver, ANP  ADVANCED PRACTICE PROVIDER FOR INFUSION PROGRAM

## 2022-04-14 NOTE — Unmapped (Signed)
1304 PT arrived for scheduled lab check and possible transfusion. PT did not require a transfusion and discharged home with family.

## 2022-04-19 ENCOUNTER — Telehealth
Admit: 2022-04-19 | Discharge: 2022-04-20 | Payer: PRIVATE HEALTH INSURANCE | Attending: Student in an Organized Health Care Education/Training Program | Primary: Student in an Organized Health Care Education/Training Program

## 2022-04-19 DIAGNOSIS — Z515 Encounter for palliative care: Principal | ICD-10-CM

## 2022-04-19 DIAGNOSIS — C9100 Acute lymphoblastic leukemia not having achieved remission: Principal | ICD-10-CM

## 2022-04-19 DIAGNOSIS — R0982 Postnasal drip: Principal | ICD-10-CM

## 2022-04-19 DIAGNOSIS — R509 Fever, unspecified: Principal | ICD-10-CM

## 2022-04-19 DIAGNOSIS — H538 Other visual disturbances: Principal | ICD-10-CM

## 2022-04-19 DIAGNOSIS — F419 Anxiety disorder, unspecified: Principal | ICD-10-CM

## 2022-04-19 DIAGNOSIS — R197 Diarrhea, unspecified: Principal | ICD-10-CM

## 2022-04-19 NOTE — Unmapped (Signed)
OUTPATIENT ONCOLOGY PALLIATIVE CARE    Principal Diagnosis: Patricia [name] is a 46 y.o. female with newly diagnosed ALL.     Assessment/Plan:   #Fevers: Persistent. Multiple hospitalizations while neutropenic. Still having borderline fevers when not neutropenic. No associated infectious symptoms, except for loose stools. Responsive to tyelnol. Onoclogy aware.   - cont tylenol 650mg  TID PRN  - Discussed infectious signs and supported her in reaching out whenever concenred    #Blurred Vision: Newer symptom. Feels like it comes and goes. Hasn't seen eye doctor in years. Is a known side effect of methotrexate.   - Will message oncology given possible relation to methotrexate    #Loose Stools: Improved, but not resolved.   - Cont imodium PRN    #Anxiety/Coping Support: Somewhat worse. Lots of anxiety about recurrent hospitalization. Even reports having panic attack at home. She put herself back on prior prescription of lexapro (started 9/8)  - Cont lexapro 10mg  daily   - continue working with local therapist - been working with him since brother died    #Advanced Care Planning  - She and her husband have an excellent understanding of the current course of her treatment.  She recognizes that she is responding well and there is some uncertainty about how many rounds of treatment she will have to go through.  Has heard that the goal of treatment is curative, and she remains very hopeful for this.  Has been able to tolerate treatment fairly well to this point.  Her major concern is about side effects from lumbar punctures.  Current focus is on cancer treatment and survival while maintaining quality of life is much as possible.    Health Care Decision Maker as of 04/19/2022    HCDM Based on Patricia [name]: Patricia [name] - [phone]      # Controlled substances risk management.   - Patient does not have a signed pain medication agreement with our team.  No controlled substances being prescribed by our team   - [place] database was reviewed today and it was appropriate.   - Urine drug screen was not performed at this visit. Findings: not applicable.   - Patient has received information about safe storage and administration of medications.   - Patient has not received a prescription for narcan; is not applicable.       F/u: 2 months    ----------------------------------------  Referring Provider: From inpatient palliative care team  Oncology Team: Patricia [name]  PCP: Patricia Lew, MD    Interval Hx, 04/19/22:  Patient reports that she is doing okay, but that everything has been kind of overwhelming.     SHe is celebrating that she is currently in remission, but also shares that she is terrified about the next few treatments because she keeps ending up in the hospital with neutropenic fever. Worrying about this has even led to an anxiety attack at home. Because of that, patient put herself back on her old lexapro.     On ROS, she notes blurry vision, post-nasal drip, recurrent fevers, loose stools, and occasional vomiting when gag reflex triggered. Notes she has chills when fevers break.     Has had post-nasal drip most her life and reports that nasal sprays, neti-pots and mucinex have never been helpful.     No pain, dyspnea, nausea, constipation.     HPI: Ph+ ALL, currently in remission.    Current cancer-directed therapy: R-HyperCVAD/MA+dasatinib??      Palliative Performance Scale: 90% -  Ambulation: Full / Normal Activity, some evidence of disease / Self-Care:Full / Intake: Normal / Level of Conscious: Full      Coping/Support Issues: See notes from Patricia [name] from recent inpatient admission for details.  They have multiple stressors but overall feels like she is coping fairly well right now.  She will reach out to our palliative care team if new concerns emerge.    Goals of Care:  Focus on cancer directed therapy and survival while hoping to maintain her quality of life is much as possible    Social History:   Name of primary support: Husband Patricia [name]  occupation: 6 grade teacher      Advance Care Planning:   HCPOA: Spouse  Natural surrogate decision maker: Husband Industrial/product designer  Living Will: no  ACP note:     Objective     Opioid Risk Tool:    Female  Female    Family history of substance abuse      Alcohol  1  3    Illegal drugs  2  3    Rx drugs  4  4    Personal history of substance abuse      Alcohol  3  3    Illegal drugs  4  4    Rx drugs  5  5    Age between 16--45 years  1  1    History of preadolescent sexual abuse  3  0    Psychological disease      ADD, OCD, bipolar, schizophrenia  2  2    Depression  1  1       Total: Low  (<3 low risk, 4-7 moderate risk, >8 high risk)    Oncology History Overview Note   Referring/Local Oncologist:    Diagnosis:   10/29/2021  Bone marrow, left iliac, aspiration and biopsy  -  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 The majority of the abnormal cells (64/88) contained an additional BCR/ABL1 fusion signal, while 9/88 abnormal cells contained an additional ABL1 and ASS1 signal.       Genetics:   Karyotype/FISH:   RESULTS   Date Value Ref Range Status   10/28/2021   Final    NOTE: This report reflects a combined study from a peripheral blood and a bone marrow core biopsy. Eleven cells from the peripheral blood and nine cells from the bone marrow core biopsy were analyzed. The BCR/ABL1 FISH analysis was performed on the peripheral blood.     Abnormal Karyotype:  47,XX,t(9;22)(q34;q11.2),+der(22)t(9;22)[18]/46,XX[2]    Abnormal FISH:  A BCR/ABL1 interphase FISH assay showed a signal pattern consistent with a BCR::ABL1 rearrangement and the 9;22 translocation in 88% of the 100 cells scored. The majority of the abnormal cells (64/88) contained an additional BCR/ABL1 fusion signal, while 9/88 abnormal cells contained an additional ABL1 and ASS1 signal.           Pertinent Phenotypic data:  CD19 98%  CD20 on diagnosis 51%  CD22 98%      Treatment Timeline:  10/29/2021: Bone marrow biopsy: Ph+ ALL, 51% expression of CD20  10/30/21: Cycle 1 day 1 GRAAPH-2005 induction  11/03/21: ITT #1   11/12/2021: IT #  2  11/18/2021: IT #3  11/29/2021: Post cycle 1 bone marrow biopsy: Morphologic CR.  MRD by flow insufficient, p190 2/100,000  12/10/2021: Cycle 2 B cycle + rituximab  12/14/21: IT#4  12/17/21: IT#5  01/10/22: Cycle 3 A cycle + rituximab  01/24/22: Bmbx - MRD-neg by PCR (p190)   02/17/22: C4 B cycle + ritux  02/18/22: IT#6  03/21/22: C5 A cycle + ritux (4th dose)  03/22/22: IT#7       B-cell acute lymphoblastic leukemia (ALL) (CMS-HCC)   10/29/2021 Initial Diagnosis    B-cell acute lymphoblastic leukemia (ALL) (CMS-HCC)     10/30/2021 -  Chemotherapy    IP/OP LEUKEMIA GRAAPH-2005 < 60 YO (OP PEGFILGRASTIM ON DAY 7)  [No description for this plan]     01/06/2022 Endocrine/Hormone Therapy    OP LEUPROLIDE (LUPRON) 11.25 MG EVERY 3 MONTHS  Plan Provider: [name], MD           REVIEW OF SYSTEMS:  A comprehensive review of 10 systems was negative except for pertinent positives noted in HPI.      PHYSICAL EXAM:   Video Visit  GEN: Awake and alert, pleasant appearing female in no acute distress  PSYCH: Alert and oriented to person, place and time. Euthymic.  LUNGS: No increased work of breathing.  SKIN: No rashes, petechiae or jaundice noted        The patient reports they are currently: at home. I spent 32 minutes on the real-time audio and video with the patient on the date of service. I spent an additional 15 minutes on pre- and post-visit activities on the date of service.     The patient was physically located in [place] or a state in which I am permitted to provide care. The patient and/or parent/guardian understood that s/he may incur co-pays and cost sharing, and agreed to the telemedicine visit. The visit was reasonable and appropriate under the circumstances given the patient's presentation at the time.    The patient and/or parent/guardian has been advised of the potential risks and limitations of this mode of treatment (including, but not limited to, the absence of in-person examination) and has agreed to be treated using telemedicine. The patient's/patient's family's questions regarding telemedicine have been answered.     If the visit was completed in an ambulatory setting, the patient and/or parent/guardian has also been advised to contact their provider???s office for worsening conditions, and seek emergency medical treatment and/or call 911 if the patient deems either necessary.        Redgie Grayer, MD  [facility] Outpatient Oncology Palliative Care

## 2022-04-20 ENCOUNTER — Other Ambulatory Visit: Admit: 2022-04-20 | Discharge: 2022-04-20 | Payer: PRIVATE HEALTH INSURANCE

## 2022-04-20 ENCOUNTER — Ambulatory Visit
Admit: 2022-04-20 | Discharge: 2022-04-20 | Payer: PRIVATE HEALTH INSURANCE | Attending: Hematology | Primary: Hematology

## 2022-04-20 DIAGNOSIS — C9100 Acute lymphoblastic leukemia not having achieved remission: Principal | ICD-10-CM

## 2022-04-20 LAB — COMPREHENSIVE METABOLIC PANEL
ALBUMIN: 3.3 g/dL — ABNORMAL LOW (ref 3.4–5.0)
ALKALINE PHOSPHATASE: 93 U/L (ref 46–116)
ALT (SGPT): 113 U/L — ABNORMAL HIGH (ref 10–49)
ANION GAP: 4 mmol/L — ABNORMAL LOW (ref 5–14)
AST (SGOT): 66 U/L — ABNORMAL HIGH (ref <=34)
BILIRUBIN TOTAL: 0.7 mg/dL (ref 0.3–1.2)
BLOOD UREA NITROGEN: 15 mg/dL (ref 9–23)
BUN / CREAT RATIO: 24
CALCIUM: 8.7 mg/dL (ref 8.7–10.4)
CHLORIDE: 107 mmol/L (ref 98–107)
CO2: 26 mmol/L (ref 20.0–31.0)
CREATININE: 0.63 mg/dL
EGFR CKD-EPI (2021) FEMALE: >90 mL/min/{1.73_m2} (ref >=60)
GLUCOSE RANDOM: 169 mg/dL (ref 70–179)
POTASSIUM: 3.7 mmol/L (ref 3.4–4.8)
PROTEIN TOTAL: 5.9 g/dL (ref 5.7–8.2)
SODIUM: 137 mmol/L (ref 135–145)

## 2022-04-20 LAB — URINALYSIS WITH MICROSCOPY
BACTERIA: NONE SEEN /HPF
BILIRUBIN UA: NEGATIVE
GLUCOSE UA: NEGATIVE
KETONES UA: NEGATIVE
NITRITE UA: NEGATIVE
PH UA: 6 (ref 5.0–9.0)
PROTEIN UA: 30 — AB
RBC UA: 7 /HPF — ABNORMAL HIGH (ref <=4)
SPECIFIC GRAVITY UA: 1.026 (ref 1.003–1.030)
SQUAMOUS EPITHELIAL: <1 /HPF (ref 0–5)
TRANSITIONAL EPITHELIAL: <1 /HPF (ref 0–2)
UROBILINOGEN UA: 4 — AB
WBC UA: 1 /HPF (ref 0–5)

## 2022-04-20 LAB — CBC W/ AUTO DIFF
BASOPHILS ABSOLUTE COUNT: 0 10*9/L (ref 0.0–0.1)
BASOPHILS RELATIVE PERCENT: 0.5 %
EOSINOPHILS ABSOLUTE COUNT: 0 10*9/L (ref 0.0–0.5)
EOSINOPHILS RELATIVE PERCENT: 0.2 %
HEMATOCRIT: 24.6 % — ABNORMAL LOW (ref 34.0–44.0)
HEMOGLOBIN: 8.5 g/dL — ABNORMAL LOW (ref 11.3–14.9)
LYMPHOCYTES ABSOLUTE COUNT: 0.8 10*9/L — ABNORMAL LOW (ref 1.1–3.6)
LYMPHOCYTES RELATIVE PERCENT: 28.2 %
MEAN CORPUSCULAR HEMOGLOBIN CONC: 34.4 g/dL (ref 32.0–36.0)
MEAN CORPUSCULAR HEMOGLOBIN: 32.6 pg — ABNORMAL HIGH (ref 25.9–32.4)
MEAN CORPUSCULAR VOLUME: 94.8 fL (ref 77.6–95.7)
MEAN PLATELET VOLUME: 7.9 fL (ref 6.8–10.7)
MONOCYTES ABSOLUTE COUNT: 0.5 10*9/L (ref 0.3–0.8)
MONOCYTES RELATIVE PERCENT: 16 %
NEUTROPHILS ABSOLUTE COUNT: 1.6 10*9/L — ABNORMAL LOW (ref 1.8–7.8)
NEUTROPHILS RELATIVE PERCENT: 55.1 %
PLATELET COUNT: 84 10*9/L — ABNORMAL LOW (ref 150–450)
RED BLOOD CELL COUNT: 2.6 10*12/L — ABNORMAL LOW (ref 3.95–5.13)
RED CELL DISTRIBUTION WIDTH: 18.5 % — ABNORMAL HIGH (ref 12.2–15.2)
WBC ADJUSTED: 3 10*9/L — ABNORMAL LOW (ref 3.6–11.2)

## 2022-04-20 LAB — SLIDE REVIEW

## 2022-04-20 LAB — LACTATE DEHYDROGENASE: LACTATE DEHYDROGENASE: 666 U/L — ABNORMAL HIGH (ref 120–246)

## 2022-04-20 MED ADMIN — acetaminophen (TYLENOL) tablet 500 mg: 500 mg | ORAL | @ 19:00:00 | Stop: 2022-04-20

## 2022-04-20 MED ADMIN — acetaminophen (TYLENOL) 500 MG tablet: ORAL | @ 19:00:00 | Stop: 2022-04-20

## 2022-04-20 MED ADMIN — acetaminophen (TYLENOL) tablet 1,000 mg: 1000 mg | ORAL | @ 19:00:00 | Stop: 2022-04-20

## 2022-04-20 NOTE — Unmapped (Unsigned)
Port accessed.  Labs drawn & sent for analysis.  To next appt.  Care provided by ES RN. Port left accessed per pt request.

## 2022-04-20 NOTE — Unmapped (Signed)
1640 Port de-accessed per protocol. Band-aid applied after hemostasis.

## 2022-04-20 NOTE — Unmapped (Signed)
[facility] Leukemia Clinic Follow Up Visit Note     [name]  Patient Age: 46 y.o.  Encounter Date: 04/20/2022    Primary Care Provider:  [name], MD    Referring Physician:  [name], MD  [address]  [place],  [place] [address]    Diagnosis:  Ph+ B-ALL  Treatment:   First-line GRAAPH-2005 with hyperCVAD + rituximab + dasatinib  Approach/Goals:  Curative  Cancer Status:  Morphologic CR, BCR/ABL p190 2/100,000 in marrow  Comorbities:   HTN, anxiety, hemorrhoids with anal fissures    Assessment/Plan:  Patricia [name] is a 46 y.o. female with past medical history of chronic right shoulder and back pain, hypertension, anxiety, and recently diagnosed Ph+ ALL. 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  She has completed 5 cycles of hyperCVAD.  She has not tolerated therapy very well and has had multiple hospitalizations for infections.  Given her deep molecular remission and her intolerance of hyperCVAD, we will transition her to maintenance chemotherapy.  This will include prednisone, vincristine, Dasatinib.  We will repeat a bone marrow biopsy prior to continuing to evaluate for the potential for blinatumomab.  There is evidence of blinatumomab may be beneficial even in the setting of MRD negative disease (reported initially in E1910), though currently there is no standard of care to use blinatumomab in MRD-negative disease.  I am reassured that she may have a very good long-term outcome, given her early MRD negativity per Short, Blood Advances, 2021.  Given her truncation of of hyperCVAD, we will continue to every 3 months PCR from her blood and every 6 month bone marrow biopsies to evaluate for the addition of blinatumomab and additional cellular therapy is needed (CAR-T versus transplant).  We will continue on with intrathecal chemotherapy with the goal of 12 total IT therapies.    Ph+ALL, in remission: On GRAAPH 2005 + dasatinib.   - Plan for bmbx   - Anticipate transitioning to maintnenance if she remains in MRD-neg CR  - Plan to continue IT chemo with goal of 12    - Continue Dasatinib 70 mg       Hemorrhoids, Anal fissure: persistent but stable, per patient 04/20/2022  - supportive care at home    Pancytopenia due to chemotherapy  - ANC recovered but platelets remain low 04/20/2022  - supportive care as detailed below when in nadir    Menses suppression: Lupron  - most recent dose 04/07/22  - next due 07/07/22     Psychosocial distress: She reports a moderate level of anxiety regarding the management of the above.   - Counseling given   - Consider ref to comprehensive cancer support program     Patient-centered care/Shared decision-making:   We discussed the plan above at length. The patient and her husband actively contributed to the conversation. Specifically, her most important outcomes are:   Prolonging overall survival and Maintaining her overall functional activity     Supportive Care Needs: We recommend based on the patient???s underlying diagnosis and treatment history the following supportive care:    1. Antimicrobial prophylaxis:  ALL (in remission, receiving post-remission therapy): Bacterial: Levofloxacin 500mg  PO daily (when absolute neutrophils </= 0.5);   Fungal: Fluconazole 200mg  PO daily (when absolute neutrophils </= 0.5);   Viral: Valacyclovir 500mg  PO daily (Continuous);   PJP:  SMX/TMP DS PO BID twice weekly (Sat/Sun)    2. Blood product support:  Leukoreduced blood products are required.  Irradiated blood products are preferred, but in case of urgent transfusion needs non-irradiated blood products may be used: 2 units for Hg <=8.0.     Coordination of care:   - see notes above  - continue visits with transfusion support  - RTC after marrow     [name], MD   Leukemia Program  Division of Hematology  [facility]      Nurse Navigator (non-clinical trial patients): Elicia Lamp, RN        Tel. [phone]       Fax. [phone]  Toll-free appointments: [phone]  Scheduling assistance: [phone]  After hours/weekends: [phone] (ask for adult hematology/oncology on-call)      History of Present Illness:   Patricia [name] is a 46 y.o. female with past medical history noted as above who presents for follow up Ph+ ALL.      Her social history is notable for being a Runner, broadcasting/film/video.  She was recently married. She lives with her husband.    Interim history  Since her last visit, she was hospitalized with neutropenic fever. She continues to feel really wiped out. She has persistent elevated temps, but isn't having persistent fevers. Notes some worsening intermittent eyesight. No substantial bleeding.      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 The majority of the abnormal cells (64/88) contained an additional BCR/ABL1 fusion signal, while 9/88 abnormal cells contained an additional ABL1 and ASS1 signal.       Genetics:   Karyotype/FISH:   RESULTS   Date Value Ref Range Status   10/28/2021   Final    NOTE: This report reflects a combined study from a peripheral blood and a bone marrow core biopsy. Eleven cells from the peripheral blood and nine cells from the bone marrow core biopsy were analyzed. The BCR/ABL1 FISH analysis was performed on the peripheral blood.     Abnormal Karyotype:  47,XX,t(9;22)(q34;q11.2),+der(22)t(9;22)[18]/46,XX[2]    Abnormal FISH:  A BCR/ABL1 interphase FISH assay showed a signal pattern consistent with a BCR::ABL1 rearrangement and the 9;22 translocation in 88% of the 100 cells scored. The majority of the abnormal cells (64/88) contained an additional BCR/ABL1 fusion signal, while 9/88 abnormal cells contained an additional ABL1 and ASS1 signal.           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  MRD by flow insufficient, p190 2/100,000  12/10/2021: Cycle 2 B cycle + rituximab  12/14/21: IT#4  12/17/21: IT#5  01/10/22: Cycle 3 A cycle + rituximab  01/24/22: Bmbx - MRD-neg by PCR (p190)   02/17/22: C4 B cycle + ritux  02/18/22: IT#6  03/21/22: C5 A cycle + ritux (4th dose)  03/22/22: IT#7           Review of Systems:   ROS reviewed and negative except as noted in H and P     Allergies:  Allergies   Allergen Reactions    Erythromycin Hives     Other reaction(s): Not available    Other      Pt cant take ibuprofen due to condition.    Sulfa (Sulfonamide Antibiotics) Anaphylaxis     Other reaction(s): Not available    Azithromycin        Medications:     Current Outpatient Medications:     acetaminophen (TYLENOL 8 HOUR) 650 MG CR tablet, Take 1 tablet (650 mg total) by mouth every eight (8) hours as needed for pain., Disp: , Rfl:     albuterol HFA 90 mcg/actuation inhaler, Inhale 2 puffs every six (6) hours as needed for wheezing., Disp: , Rfl:     cetirizine (ZYRTEC) 10 MG tablet, Take 1 tablet (10 mg total) by mouth nightly., Disp: 30 tablet, Rfl: 2    dapsone 100 MG tablet, Take 1 tablet (100 mg total) by mouth daily., Disp: 30 tablet, Rfl: 2    dasatinib (SPRYCEL) 70 MG tablet, Take 1 tablet (70 mg total) by mouth daily., Disp: 30 tablet, Rfl: 7    escitalopram oxalate (LEXAPRO) 10 MG tablet, Take 1 tablet (10 mg total) by mouth daily., Disp: , Rfl:     filgrastim-aafi (NIVESTYM) 480 mcg/0.8 mL Syrg injection syringe, Inject the contents of one syringe (480 mcg or 0.8 mL) under the skin once daily for 7 days with each cycle of chemotherapy. Administer 24 hours after completion of chemotherapy., Disp: 5.6 mL, Rfl: 3    levalbuterol (XOPENEX) 0.31 mg/3 mL nebulizer solution, Inhale 3 mL (0.31 mg total) by nebulization every four (4) hours as needed for wheezing (cough)., Disp: 45 mL, Rfl: 0    loperamide (IMODIUM A-D) 2 mg tablet, Take 1 tablet (2 mg total) by mouth four (4) times a day as needed for diarrhea., Disp: 120 tablet, Rfl: 1    oxyCODONE (ROXICODONE) 5 MG immediate release tablet, Take 1-2 tablets (5-10 mg total) by mouth every four (4) hours as needed for pain, Disp: , Rfl:     prochlorperazine (COMPAZINE) 10 MG tablet, Take 1 tablet (10 mg total) by mouth every six (6) hours as needed for up to 14 days., Disp: 30 tablet, Rfl: 0    valACYclovir (VALTREX) 500 MG tablet, Take 1 tablet (500 mg total) by mouth daily., Disp: 90 tablet, Rfl: 3    Medical History:  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       Social History:  Social History     Social History Narrative    Not on file       Family History:  No family history on file.    Objective:   BP 117/80  - Pulse 117  - Temp 37.9 ??C (100.2 ??F)  - Resp 18  - Ht 167.6 cm (5' 5.98)  - Wt 80 kg (176 lb 5.9 oz)  - SpO2 95%  - BMI 28.48 kg/m??     Physical Exam:  GENERAL: Frail-appearing white woman accompanied by her husband.  HEART: Normal color, not excessive pallor.  CHEST/LUNG: Normal work of breathing. No dyspnea with conversation. No crackles.   ABDOMEN: No obvious distention.    EXTREMITIES: No edema, cyanosis or clubbing.   SKIN: No  noticable rash or petechiae.   NEURO EXAM: Grossly intact.       Test Results:  04/20/2022 CBC/d and CMP reviewed with patient

## 2022-04-20 NOTE — Unmapped (Addendum)
Nice to see you today.     We discussed the following:      1. Ph+ ALL - We discussed today that we would transition from HyperCVAD to maintenance therapy if your bone marrow looks good.   - I will schedule you for a bone marrow biopsy in about a week and a half - Chelsea will reach out about the timing   - I will see you afterwards   - We will continue the lumbar punctures until we reach 12 total     2. Elevated temps - I don't know what is going on with this. Continue to monitor it.   - If you develop persistent fevers above 100.4, let us know.   - I'll let you know if anything comes back on the urinalysis    3. Eyesight - I'd see your optometrist to see if you need new prescription.     Labs in 1 week at [facility].     [name], MD  Leukemia Program       Nurse Navigator (non-clinical trial patients): Elicia Lamp, RN        Tel. [phone]       Fax. [phone]  Toll-free appointments: [phone]  Scheduling assistance: [phone]  After hours/weekends: [phone] (ask for adult hematology/oncology on-call)      Lab Results   Component Value Date    WBC 3.0 (L) 04/20/2022    HGB 8.5 (L) 04/20/2022    HCT 24.6 (L) 04/20/2022    PLT 84 (L) 04/20/2022       Lab Results   Component Value Date    NA 137 04/20/2022    K 3.7 04/20/2022    CL 107 04/20/2022    CO2 26.0 04/20/2022    BUN 15 04/20/2022    CREATININE 0.63 04/20/2022    GLU 169 04/20/2022    CALCIUM 8.7 04/20/2022    MG 1.9 03/30/2022    PHOS 3.5 03/30/2022       Lab Results   Component Value Date    BILITOT 0.7 04/20/2022    BILIDIR 0.30 03/28/2022    PROT 5.9 04/20/2022    ALBUMIN 3.3 (L) 04/20/2022    ALT 113 (H) 04/20/2022    AST 66 (H) 04/20/2022    ALKPHOS 93 04/20/2022       Lab Results   Component Value Date    INR 0.92 11/01/2021    APTT 22.5 (L) 11/01/2021

## 2022-04-27 DIAGNOSIS — C9100 Acute lymphoblastic leukemia not having achieved remission: Principal | ICD-10-CM

## 2022-04-28 ENCOUNTER — Ambulatory Visit
Admit: 2022-04-28 | Discharge: 2022-04-29 | Payer: PRIVATE HEALTH INSURANCE | Attending: Adult Health | Primary: Adult Health

## 2022-04-28 ENCOUNTER — Other Ambulatory Visit: Admit: 2022-04-28 | Discharge: 2022-04-29 | Payer: PRIVATE HEALTH INSURANCE

## 2022-04-28 ENCOUNTER — Ambulatory Visit: Admit: 2022-04-28 | Discharge: 2022-04-29 | Payer: PRIVATE HEALTH INSURANCE

## 2022-04-28 ENCOUNTER — Encounter: Admit: 2022-04-28 | Discharge: 2022-04-29 | Payer: PRIVATE HEALTH INSURANCE

## 2022-04-28 DIAGNOSIS — C9100 Acute lymphoblastic leukemia not having achieved remission: Principal | ICD-10-CM

## 2022-04-28 LAB — CBC W/ AUTO DIFF
BASOPHILS ABSOLUTE COUNT: 0 10*9/L (ref 0.0–0.1)
BASOPHILS RELATIVE PERCENT: 0.2 %
EOSINOPHILS ABSOLUTE COUNT: 0 10*9/L (ref 0.0–0.5)
EOSINOPHILS RELATIVE PERCENT: 0.9 %
HEMATOCRIT: 22.8 % — ABNORMAL LOW (ref 34.0–44.0)
HEMOGLOBIN: 7.7 g/dL — ABNORMAL LOW (ref 11.3–14.9)
LYMPHOCYTES ABSOLUTE COUNT: 2 10*9/L (ref 1.1–3.6)
LYMPHOCYTES RELATIVE PERCENT: 44.8 %
MEAN CORPUSCULAR HEMOGLOBIN CONC: 33.7 g/dL (ref 32.0–36.0)
MEAN CORPUSCULAR HEMOGLOBIN: 34.2 pg — ABNORMAL HIGH (ref 25.9–32.4)
MEAN CORPUSCULAR VOLUME: 101.3 fL — ABNORMAL HIGH (ref 77.6–95.7)
MEAN PLATELET VOLUME: 7.7 fL (ref 6.8–10.7)
MONOCYTES ABSOLUTE COUNT: 0.6 10*9/L (ref 0.3–0.8)
MONOCYTES RELATIVE PERCENT: 12.7 %
NEUTROPHILS ABSOLUTE COUNT: 1.8 10*9/L (ref 1.8–7.8)
NEUTROPHILS RELATIVE PERCENT: 41.4 %
PLATELET COUNT: 135 10*9/L — ABNORMAL LOW (ref 150–450)
RED BLOOD CELL COUNT: 2.25 10*12/L — ABNORMAL LOW (ref 3.95–5.13)
RED CELL DISTRIBUTION WIDTH: 24.6 % — ABNORMAL HIGH (ref 12.2–15.2)
WBC ADJUSTED: 4.4 10*9/L (ref 3.6–11.2)

## 2022-04-28 LAB — SMEAR - BONE MARROW PATIENT

## 2022-04-28 LAB — COMPREHENSIVE METABOLIC PANEL
ALBUMIN: 3.4 g/dL (ref 3.4–5.0)
ALKALINE PHOSPHATASE: 105 U/L (ref 46–116)
ALT (SGPT): 101 U/L — ABNORMAL HIGH (ref 10–49)
ANION GAP: 9 mmol/L (ref 5–14)
AST (SGOT): 63 U/L — ABNORMAL HIGH (ref <=34)
BILIRUBIN TOTAL: 0.6 mg/dL (ref 0.3–1.2)
BLOOD UREA NITROGEN: 15 mg/dL (ref 9–23)
BUN / CREAT RATIO: 26
CALCIUM: 9.1 mg/dL (ref 8.7–10.4)
CHLORIDE: 108 mmol/L — ABNORMAL HIGH (ref 98–107)
CO2: 24 mmol/L (ref 20.0–31.0)
CREATININE: 0.58 mg/dL — ABNORMAL LOW
EGFR CKD-EPI (2021) FEMALE: >90 mL/min/{1.73_m2} (ref >=60)
GLUCOSE RANDOM: 206 mg/dL — ABNORMAL HIGH (ref 70–179)
POTASSIUM: 3.7 mmol/L (ref 3.4–4.8)
PROTEIN TOTAL: 5.8 g/dL (ref 5.7–8.2)
SODIUM: 141 mmol/L (ref 135–145)

## 2022-04-28 LAB — SLIDE REVIEW

## 2022-04-28 LAB — LACTATE DEHYDROGENASE: LACTATE DEHYDROGENASE: 795 U/L — ABNORMAL HIGH (ref 120–246)

## 2022-04-28 MED ADMIN — cetirizine (ZyrTEC) tablet 10 mg: 10 mg | ORAL | @ 16:00:00

## 2022-04-28 MED ADMIN — heparin, porcine (PF) 100 unit/mL injection 500 Units: 500 [IU] | INTRAVENOUS | @ 20:00:00 | Stop: 2022-04-29

## 2022-04-28 MED ADMIN — sodium chloride (NS) 0.9 % infusion: 50 mL/h | INTRAVENOUS | @ 16:00:00

## 2022-04-28 MED ADMIN — LORazepam (ATIVAN) injection 1 mg: 1 mg | INTRAVENOUS | @ 15:00:00 | Stop: 2022-04-28

## 2022-04-28 MED ADMIN — acetaminophen (TYLENOL) tablet 650 mg: 650 mg | ORAL | @ 16:00:00 | Stop: 2022-04-28

## 2022-04-28 NOTE — Unmapped (Signed)
Date of Service: 04/28/2022      Patient Active Problem List   Diagnosis    Leukocytosis    B-cell acute lymphoblastic leukemia (ALL) (CMS-HCC)    Acute cough    Dyspepsia    Acute frontal sinusitis    Pancytopenia (CMS-HCC)    Febrile neutropenia (CMS-HCC)    Blurred vision, bilateral    Anxiety       Indication:    Diagnosis ICD-10-CM Associated Orders   1. Anxiety  F41.9 LORazepam (ATIVAN) injection 1 mg      2. B-cell acute lymphoblastic leukemia (ALL) (CMS-HCC)  C91.00 Hematopathology Order     Hematopathology Order     Cytogenetics Cancer/FISH NON-BLOOD     Cytogenetics Cancer/FISH NON-BLOOD     B-ALL Minimal Residual Disease (MRD), Flow Cytometry, Bone Marrow     B-ALL Minimal Residual Disease (MRD), Flow Cytometry, Bone Marrow     DNA Extract and Hold     DNA Extract and Hold     BCR::ABL1 p190     BCR::ABL1 p190     Smear for Bone Marrow Patients     Smear for Bone Marrow Patients     Cytogenetics AP Order     Cytogenetics AP Order          Premedication: Ativan 1mg  IV and Ativan 1.5mg  IV  Driver confirmed: yes    Ordering Provider: [name], MD    Clinician(s) Performing Procedure: Markus Jarvis, AGNP        Bone Marrow Aspirate and Biopsy, right side    The risks, benefits, and alternatives to the procedure were discussed. All questions were answered. The patient verbalized understanding and signed informed consent. After a time-out in which his patient identifiers were checked by 2 providers, the patient was laid in prone position on the table.   The  posterior superior iliac spine and iliac crest were cleaned, prepped and draped in the usual sterile fashion.     Anesthetic agent used: 2% plain lidocaine.      Utilizing a Ranfac needle, a bone marrow aspiration and biopsy was performed.  Specimen was sent for routine histopathologic stains and sectioning, flow cytometry, cytogenetics, and molecular analysis.     A pressure dressing was applied to the biopsy site.  Patient tolerated the procedure well.  Hemostasis was confirmed upon discharge.     The patient was given verbal instructions for wound care, such as to keep the biopsy site dry, covered for 24 hours, and to call your physician for a temperature > 100.5.  Tylenol may be taken for discomfort.    Specimens Collected:  EDTA x 2  Heparin x 2  Core biopsy x 1

## 2022-04-29 NOTE — Unmapped (Signed)
[facility] Clinical Assessment & Refill Coordination Note    Patricia [name], DOB: August 10, 1975  Phone: [phone] (home)     All above HIPAA information was verified with patient.     Was a Nurse, learning disability used for this call? No    Specialty Medication(s):   Hematology/Oncology: Sprycel 70 mg     Current Outpatient Medications   Medication Sig Dispense Refill    acetaminophen (TYLENOL 8 HOUR) 650 MG CR tablet Take 1 tablet (650 mg total) by mouth every eight (8) hours as needed for pain.      albuterol HFA 90 mcg/actuation inhaler Inhale 2 puffs every six (6) hours as needed for wheezing.      cetirizine (ZYRTEC) 10 MG tablet Take 1 tablet (10 mg total) by mouth nightly. 30 tablet 2    dapsone 100 MG tablet Take 1 tablet (100 mg total) by mouth daily. 30 tablet 2    dasatinib (SPRYCEL) 70 MG tablet Take 1 tablet (70 mg total) by mouth daily. 30 tablet 7    escitalopram oxalate (LEXAPRO) 10 MG tablet Take 1 tablet (10 mg total) by mouth daily.      filgrastim-aafi (NIVESTYM) 480 mcg/0.8 mL Syrg injection syringe Inject the contents of one syringe (480 mcg or 0.8 mL) under the skin once daily for 7 days with each cycle of chemotherapy. Administer 24 hours after completion of chemotherapy. 5.6 mL 3    levalbuterol (XOPENEX) 0.31 mg/3 mL nebulizer solution Inhale 3 mL (0.31 mg total) by nebulization every four (4) hours as needed for wheezing (cough). 45 mL 0    loperamide (IMODIUM A-D) 2 mg tablet Take 1 tablet (2 mg total) by mouth four (4) times a day as needed for diarrhea. 120 tablet 1    oxyCODONE (ROXICODONE) 5 MG immediate release tablet Take 1-2 tablets (5-10 mg total) by mouth every four (4) hours as needed for pain      prochlorperazine (COMPAZINE) 10 MG tablet Take 1 tablet (10 mg total) by mouth every six (6) hours as needed for up to 14 days. 30 tablet 0    valACYclovir (VALTREX) 500 MG tablet Take 1 tablet (500 mg total) by mouth daily. 90 tablet 3     No current facility-administered medications for this visit.        Changes to medications: [name] reports no changes at this time.    Allergies   Allergen Reactions    Erythromycin Hives     Other reaction(s): Not available    Other      Pt cant take ibuprofen due to condition.    Sulfa (Sulfonamide Antibiotics) Anaphylaxis     Other reaction(s): Not available    Azithromycin        Changes to allergies: No    SPECIALTY MEDICATION ADHERENCE     Sprycel 70 mg: 7 days of medicine on hand     Medication Adherence    Patient reported X missed doses in the last month: 0  Specialty Medication: Sprycel 70 mg once daily  Patient is on additional specialty medications: No  Informant: patient                  Confirmed plan for next specialty medication refill: delivery by pharmacy  Refills needed for supportive medications: not needed          Specialty medication(s) dose(s) confirmed: Regimen is correct and unchanged.     Are there any concerns with adherence? No  Adherence counseling provided? Not needed    CLINICAL MANAGEMENT AND INTERVENTION      Clinical Benefit Assessment:    Do you feel the medicine is effective or helping your condition? Yes    Clinical Benefit counseling provided? Not needed    Adverse Effects Assessment:    Are you experiencing any side effects? No    Are you experiencing difficulty administering your medicine? No    Quality of Life Assessment:    Quality of Life    Rheumatology  Oncology  1. What impact has your specialty medication had on the reduction of your daily pain or discomfort level?: None  2. On a scale of 1-10, how would you rate your ability to manage side effects associated with your specialty medication? (1=no issues, 10 = unable to take medication due to side effects): 2  Dermatology  Cystic Fibrosis          How many days over the past month did your B-cell ALL  keep you from your normal activities? For example, brushing your teeth or getting up in the morning. 0    Have you discussed this with your provider? Not needed    Acute Infection Status:    Acute infections noted within Epic:  No active infections  Patient reported infection: None    Therapy Appropriateness:    Is therapy appropriate and patient progressing towards therapeutic goals? Yes, therapy is appropriate and should be continued    DISEASE/MEDICATION-SPECIFIC INFORMATION      N/A    PATIENT SPECIFIC NEEDS     Does the patient have any physical, cognitive, or cultural barriers? No    Is the patient high risk? No    Does the patient require a Care Management Plan? No     SOCIAL DETERMINANTS OF HEALTH     At the [facility], we have learned that life circumstances - like trouble affording food, housing, utilities, or transportation can affect the health of many of our patients.   That is why we wanted to ask: are you currently experiencing any life circumstances that are negatively impacting your health and/or quality of life? Patient declined to answer    Social Determinants of Health     Financial Resource Strain: Low Risk  (11/02/2021)    Overall Financial Resource Strain (CARDIA)     Difficulty of Paying Living Expenses: Not hard at all   Internet Connectivity: Not on file   Food Insecurity: No Food Insecurity (11/02/2021)    Hunger Vital Sign     Worried About Running Out of Food in the Last Year: Never true     Ran Out of Food in the Last Year: Never true   Tobacco Use: Low Risk  (03/22/2022)    Patient History     Smoking Tobacco Use: Never     Smokeless Tobacco Use: Never     Passive Exposure: Not on file   Housing/Utilities: Low Risk  (11/02/2021)    Housing/Utilities     Within the past 12 months, have you ever stayed: outside, in a car, in a tent, in an overnight shelter, or temporarily in someone else's home (i.e. couch-surfing)?: No     Are you worried about losing your housing?: No     Within the past 12 months, have you been unable to get utilities (heat, electricity) when it was really needed?: No   Alcohol Use: Not on file Transportation Needs: No Transportation Needs (11/02/2021)    PRAPARE - Transportation  Lack of Transportation (Medical): No     Lack of Transportation (Non-Medical): No   Substance Use: Not on file   Health Literacy: Not on file   Physical Activity: Not on file   Interpersonal Safety: Not on file   Stress: Not on file   Intimate Partner Violence: Not on file   Depression: Not on file   Social Connections: Not on file     Would you be willing to receive help with any of the needs that you have identified today? Not applicable     SHIPPING     Specialty Medication(s) to be Shipped:   Hematology/Oncology: Sprycel 70 mg    Other medication(s) to be shipped: No additional medications requested for fill at this time     Changes to insurance: No    Delivery Scheduled: Yes, Expected medication delivery date: 05/04/22.     Medication will be delivered via Next Day Courier to the confirmed prescription address in [facility].    The patient will receive a drug information handout for each medication shipped and additional FDA Medication Guides as required.  Verified that patient has previously received a Conservation officer, historic buildings and a Surveyor, mining.    The patient or caregiver noted above participated in the development of this care plan and knows that they can request review of or adjustments to the care plan at any time.      All of the patient's questions and concerns have been addressed.    Kermit Balo, [facility]   [facility] Specialty Pharmacist

## 2022-04-29 NOTE — Unmapped (Signed)
Patient arrived to clinic for BMBX, labs drawn and Hgb 7.7, care was transferred to this nurse to administer 2 units of PRBC. Port flushed, blood return noted, infusing. Transfusion consent on file. Pre-meds administered. Patient tolerated transfusion rate of 420ml/hr, transfused per protocol, tolerated well, no distress noted, vital signs stable. Port flushed/de-accessed, discharged in stable condition.

## 2022-05-03 MED FILL — SPRYCEL 70 MG TABLET: ORAL | 30 days supply | Qty: 30 | Fill #2

## 2022-05-04 NOTE — Unmapped (Signed)
[facility] Follow Up Visit Note     [name]  Patient Age: 46 y.o.  Encounter Date: 05/05/2022    Primary Care Provider:  [name], MD    Referring Physician:  Oneita Hurt, None Per Patient  [address] [place],  [place] [address]    Diagnosis:  Ph+ B-ALL  Treatment:   Maintenance: VCR, Pred, Dasatinib   Approach/Goals:  Curative  Cancer Status:  Morphologic CR, BCR/ABL p190 negative in marrow  Comorbities:   HTN, anxiety, hemorrhoids with anal fissures    Assessment/Plan:  Patricia [name] is a 46 y.o. female with past medical history of chronic right shoulder and back pain, hypertension, anxiety, and recently diagnosed Ph+ ALL. 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  She has completed 5 cycles of hyperCVAD.  She has not tolerated therapy very well and has had multiple hospitalizations for infections.  Given her deep molecular remission and her intolerance of hyperCVAD, we will transition her to maintenance chemotherapy.  This will include prednisone, vincristine, Dasatinib.  We will repeat a bone marrow biopsy prior to continuing to evaluate for the potential for blinatumomab.  There is evidence of blinatumomab may be beneficial even in the setting of MRD negative disease (reported initially in E1910), though currently there is no standard of care to use blinatumomab in MRD-negative disease.  I am reassured that she may have a very good long-term outcome, given her early MRD negativity per Short, Blood Advances, 2021.  Given her truncation of of hyperCVAD, we will continue to every 3 months PCR from her blood and every 6 month bone marrow biopsies to evaluate for the addition of blinatumomab and additional cellular therapy as needed (CAR-T versus transplant).  We will continue on with intrathecal chemotherapy with the goal of 12 total IT therapies.    Ph+ALL, in remission: On GRAAPH 2005 + dasatinib.   - Transitioning to maintenance once counts stabilize  - Dasatinib 100 mg, VCR, pred 100 D1-5   - Plan to continue IT chemo with goal of 12    - Continue Dasatinib 70 mg       Hemorrhoids, Anal fissure: persistent but stable, per patient 05/05/2022  - supportive care at home    Pancytopenia due to chemotherapy  - ANC recovered but platelets remain low 05/05/2022  - supportive care as detailed below when in nadir    Menses suppression: Lupron  - most recent dose 04/07/22  - next due 07/07/22     Psychosocial distress: She reports a moderate level of anxiety regarding the management of the above.   - Counseling given   - Consider ref to comprehensive cancer support program     Patient-centered care/Shared decision-making:   We discussed the plan above at length. The patient and her husband actively contributed to the conversation. Specifically, her most important outcomes are:   Prolonging overall survival and Maintaining her overall functional activity     Supportive Care Needs: We recommend based on the patient???s underlying diagnosis and treatment history the following supportive care:    1. Antimicrobial prophylaxis:  ALL (in remission, receiving post-remission therapy): Bacterial: Levofloxacin 500mg  PO daily (when absolute neutrophils </= 0.5);   Fungal: Fluconazole 200mg  PO daily (when absolute neutrophils </= 0.5);   Viral: Valacyclovir 500mg  PO daily (Continuous);   PJP:  SMX/TMP DS PO BID twice weekly (Sat/Sun)    2. Blood product support:  Leukoreduced blood products are required.  Irradiated blood products are preferred, but in case of urgent transfusion needs non-irradiated blood products may be used: 2 units for Hg <=8.0.     Coordination of care:   - Start maintenance     Patricia Aloe, MD   Leukemia Program  Division of Hematology  [facility]      Nurse Navigator (non-clinical trial patients): Patricia Lamp, RN        Tel. [phone]       Fax. [phone]  Toll-free appointments: [phone]  Scheduling assistance: [phone]  After hours/weekends: [phone] (ask for adult hematology/oncology on-call)      History of Present Illness:   Patricia [name] is a 46 y.o. female with past medical history noted as above who presents for follow up Ph+ ALL.      Her social history is notable for being a Runner, broadcasting/film/video.  She was recently married. She lives with her husband.    Interim history  Still feeling pretty crummy.  Still with some elevated temps, but no fevers.    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 The majority of the abnormal cells (64/88) contained an additional BCR/ABL1 fusion signal, while 9/88 abnormal cells contained an additional ABL1 and ASS1 signal.       Genetics:   Karyotype/FISH:   RESULTS   Date Value Ref Range Status   10/28/2021   Final    NOTE: This report reflects a combined study from a peripheral blood and a bone marrow core biopsy. Eleven cells from the peripheral blood and nine cells from the bone marrow core biopsy were analyzed. The BCR/ABL1 FISH analysis was performed on the peripheral blood.     Abnormal Karyotype:  47,XX,t(9;22)(q34;q11.2),+der(22)t(9;22)[18]/46,XX[2]    Abnormal FISH:  A BCR/ABL1 interphase FISH assay showed a signal pattern consistent with a BCR::ABL1 rearrangement and the 9;22 translocation in 88% of the 100 cells scored. The majority of the abnormal cells (64/88) contained an additional BCR/ABL1 fusion signal, while 9/88 abnormal cells contained an additional ABL1 and ASS1 signal.           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  MRD by flow insufficient, p190 2/100,000  12/10/2021: Cycle 2 B cycle + rituximab  12/14/21: IT#4  12/17/21: IT#5  01/10/22: Cycle 3 A cycle + rituximab  01/24/22: Bmbx - MRD-neg by PCR (p190)   02/17/22: C4 B cycle + ritux  02/18/22: IT#6  03/21/22: C5 A cycle + ritux (4th dose)  03/22/22: IT#7  04/29/22: Bmbx - MRD-neg by PCR (p190)   05/09/22: Start maintenance            Review of Systems:   ROS reviewed and negative except  as noted in H and P     Allergies:  Allergies   Allergen Reactions    Erythromycin Hives     Other reaction(s): Not available    Other      Pt cant take ibuprofen due to condition.    Sulfa (Sulfonamide Antibiotics) Anaphylaxis     Other reaction(s): Not available    Azithromycin        Medications:     Current Outpatient Medications:     acetaminophen (TYLENOL 8 HOUR) 650 MG CR tablet, Take 1 tablet (650 mg total) by mouth every eight (8) hours as needed for pain., Disp: , Rfl:     albuterol HFA 90 mcg/actuation inhaler, Inhale 2 puffs every six (6) hours as needed for wheezing., Disp: , Rfl:     cetirizine (ZYRTEC) 10 MG tablet, Take 1 tablet (10 mg total) by mouth nightly., Disp: 30 tablet, Rfl: 2    dapsone 100 MG tablet, Take 1 tablet (100 mg total) by mouth daily., Disp: 30 tablet, Rfl: 2    dasatinib (SPRYCEL) 70 MG tablet, Take 1 tablet (70 mg total) by mouth daily., Disp: 30 tablet, Rfl: 7    escitalopram oxalate (LEXAPRO) 10 MG tablet, Take 1 tablet (10 mg total) by mouth daily., Disp: , Rfl:     filgrastim-aafi (NIVESTYM) 480 mcg/0.8 mL Syrg injection syringe, Inject the contents of one syringe (480 mcg or 0.8 mL) under the skin once daily for 7 days with each cycle of chemotherapy. Administer 24 hours after completion of chemotherapy., Disp: 5.6 mL, Rfl: 3    levalbuterol (XOPENEX) 0.31 mg/3 mL nebulizer solution, Inhale 3 mL (0.31 mg total) by nebulization every four (4) hours as needed for wheezing (cough)., Disp: 45 mL, Rfl: 0    loperamide (IMODIUM A-D) 2 mg tablet, Take 1 tablet (2 mg total) by mouth four (4) times a day as needed for diarrhea., Disp: 120 tablet, Rfl: 1    oxyCODONE (ROXICODONE) 5 MG immediate release tablet, Take 1-2 tablets (5-10 mg total) by mouth every four (4) hours as needed for pain, Disp: , Rfl:     prochlorperazine (COMPAZINE) 10 MG tablet, Take 1 tablet (10 mg total) by mouth every six (6) hours as needed for up to 14 days., Disp: 30 tablet, Rfl: 0    valACYclovir (VALTREX) 500 MG tablet, Take 1 tablet (500 mg total) by mouth daily., Disp: 90 tablet, Rfl: 3    Medical History:  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       Social History:  Social History     Social History Narrative    Not on file       Family History:  No family history on file.    Objective:   BP 117/81  - Pulse 108  - Temp 37.1 ??C (98.7 ??F) (Temporal)  - Resp 17  - Wt 81.8 kg (180 lb 5.4 oz)  - SpO2 95%  - BMI 29.12 kg/m??     Physical Exam:  GENERAL: Fair-appearing white woman accompanied by her husband.  HEART: Normal color, not excessive pallor.  CHEST/LUNG: Normal work of breathing. No dyspnea with conversation. No crackles.   ABDOMEN: No obvious distention.    EXTREMITIES: No edema, cyanosis or clubbing.   SKIN: No noticable rash or petechiae.   NEURO EXAM: Grossly intact.       Test Results:  05/05/2022 CBC/d and CMP reviewed with patient

## 2022-05-05 ENCOUNTER — Other Ambulatory Visit: Admit: 2022-05-05 | Discharge: 2022-05-05 | Payer: PRIVATE HEALTH INSURANCE

## 2022-05-05 ENCOUNTER — Ambulatory Visit
Admit: 2022-05-05 | Discharge: 2022-05-05 | Payer: PRIVATE HEALTH INSURANCE | Attending: Hematology | Primary: Hematology

## 2022-05-05 DIAGNOSIS — R509 Fever, unspecified: Principal | ICD-10-CM

## 2022-05-05 DIAGNOSIS — C9100 Acute lymphoblastic leukemia not having achieved remission: Principal | ICD-10-CM

## 2022-05-05 LAB — CBC W/ AUTO DIFF
BASOPHILS ABSOLUTE COUNT: 0 10*9/L (ref 0.0–0.1)
BASOPHILS RELATIVE PERCENT: 0.3 %
EOSINOPHILS ABSOLUTE COUNT: 0 10*9/L (ref 0.0–0.5)
EOSINOPHILS RELATIVE PERCENT: 0.9 %
HEMATOCRIT: 29.6 % — ABNORMAL LOW (ref 34.0–44.0)
HEMOGLOBIN: 10.3 g/dL — ABNORMAL LOW (ref 11.3–14.9)
LYMPHOCYTES ABSOLUTE COUNT: 2.3 10*9/L (ref 1.1–3.6)
LYMPHOCYTES RELATIVE PERCENT: 65.6 %
MEAN CORPUSCULAR HEMOGLOBIN CONC: 35 g/dL (ref 32.0–36.0)
MEAN CORPUSCULAR HEMOGLOBIN: 34.5 pg — ABNORMAL HIGH (ref 25.9–32.4)
MEAN CORPUSCULAR VOLUME: 98.6 fL — ABNORMAL HIGH (ref 77.6–95.7)
MEAN PLATELET VOLUME: 7.7 fL (ref 6.8–10.7)
MONOCYTES ABSOLUTE COUNT: 0.6 10*9/L (ref 0.3–0.8)
MONOCYTES RELATIVE PERCENT: 17.3 %
NEUTROPHILS ABSOLUTE COUNT: 0.6 10*9/L — ABNORMAL LOW (ref 1.8–7.8)
NEUTROPHILS RELATIVE PERCENT: 15.9 %
PLATELET COUNT: 127 10*9/L — ABNORMAL LOW (ref 150–450)
RED BLOOD CELL COUNT: 3 10*12/L — ABNORMAL LOW (ref 3.95–5.13)
RED CELL DISTRIBUTION WIDTH: 21.9 % — ABNORMAL HIGH (ref 12.2–15.2)
WBC ADJUSTED: 3.6 10*9/L (ref 3.6–11.2)

## 2022-05-05 LAB — COMPREHENSIVE METABOLIC PANEL
ALBUMIN: 4 g/dL (ref 3.4–5.0)
ALKALINE PHOSPHATASE: 128 U/L — ABNORMAL HIGH (ref 46–116)
ALT (SGPT): 87 U/L — ABNORMAL HIGH (ref 10–49)
ANION GAP: 9 mmol/L (ref 5–14)
AST (SGOT): 62 U/L — ABNORMAL HIGH (ref <=34)
BILIRUBIN TOTAL: 0.6 mg/dL (ref 0.3–1.2)
BLOOD UREA NITROGEN: 21 mg/dL (ref 9–23)
BUN / CREAT RATIO: 33
CALCIUM: 8.9 mg/dL (ref 8.7–10.4)
CHLORIDE: 109 mmol/L — ABNORMAL HIGH (ref 98–107)
CO2: 24 mmol/L (ref 20.0–31.0)
CREATININE: 0.63 mg/dL
EGFR CKD-EPI (2021) FEMALE: >90 mL/min/{1.73_m2} (ref >=60)
GLUCOSE RANDOM: 120 mg/dL (ref 70–179)
POTASSIUM: 3.8 mmol/L (ref 3.4–4.8)
PROTEIN TOTAL: 6.4 g/dL (ref 5.7–8.2)
SODIUM: 142 mmol/L (ref 135–145)

## 2022-05-05 LAB — SLIDE REVIEW

## 2022-05-05 LAB — LACTATE DEHYDROGENASE: LACTATE DEHYDROGENASE: 786 U/L — ABNORMAL HIGH (ref 120–246)

## 2022-05-06 DIAGNOSIS — C9100 Acute lymphoblastic leukemia not having achieved remission: Principal | ICD-10-CM

## 2022-05-11 ENCOUNTER — Ambulatory Visit: Admit: 2022-05-11 | Discharge: 2022-05-12 | Payer: PRIVATE HEALTH INSURANCE

## 2022-05-11 ENCOUNTER — Institutional Professional Consult (permissible substitution): Admit: 2022-05-11 | Discharge: 2022-05-12 | Payer: PRIVATE HEALTH INSURANCE

## 2022-05-11 DIAGNOSIS — C9100 Acute lymphoblastic leukemia not having achieved remission: Principal | ICD-10-CM

## 2022-05-11 LAB — CBC W/ AUTO DIFF
BASOPHILS ABSOLUTE COUNT: 0 10*9/L (ref 0.0–0.1)
BASOPHILS RELATIVE PERCENT: 0.6 %
EOSINOPHILS ABSOLUTE COUNT: 0.1 10*9/L (ref 0.0–0.5)
EOSINOPHILS RELATIVE PERCENT: 5.3 %
HEMATOCRIT: 26.8 % — ABNORMAL LOW (ref 34.0–44.0)
HEMOGLOBIN: 9.2 g/dL — ABNORMAL LOW (ref 11.3–14.9)
LYMPHOCYTES ABSOLUTE COUNT: 0.9 10*9/L — ABNORMAL LOW (ref 1.1–3.6)
LYMPHOCYTES RELATIVE PERCENT: 56.9 %
MEAN CORPUSCULAR HEMOGLOBIN CONC: 34.4 g/dL (ref 32.0–36.0)
MEAN CORPUSCULAR HEMOGLOBIN: 35.5 pg — ABNORMAL HIGH (ref 25.9–32.4)
MEAN CORPUSCULAR VOLUME: 103.2 fL — ABNORMAL HIGH (ref 77.6–95.7)
MEAN PLATELET VOLUME: 7 fL (ref 6.8–10.7)
MONOCYTES ABSOLUTE COUNT: 0.5 10*9/L (ref 0.3–0.8)
MONOCYTES RELATIVE PERCENT: 32.7 %
NEUTROPHILS ABSOLUTE COUNT: 0.1 10*9/L — CL (ref 1.8–7.8)
NEUTROPHILS RELATIVE PERCENT: 4.5 %
NUCLEATED RED BLOOD CELLS: 0 /100{WBCs} (ref <=4)
PLATELET COUNT: 126 10*9/L — ABNORMAL LOW (ref 150–450)
RED BLOOD CELL COUNT: 2.6 10*12/L — ABNORMAL LOW (ref 3.95–5.13)
RED CELL DISTRIBUTION WIDTH: 22 % — ABNORMAL HIGH (ref 12.2–15.2)
WBC ADJUSTED: 1.6 10*9/L — ABNORMAL LOW (ref 3.6–11.2)

## 2022-05-11 LAB — COMPREHENSIVE METABOLIC PANEL
ALBUMIN: 3.6 g/dL (ref 3.4–5.0)
ALKALINE PHOSPHATASE: 125 U/L — ABNORMAL HIGH (ref 46–116)
ALT (SGPT): 82 U/L — ABNORMAL HIGH (ref 10–49)
ANION GAP: 7 mmol/L (ref 5–14)
AST (SGOT): 46 U/L — ABNORMAL HIGH (ref <=34)
BILIRUBIN TOTAL: 0.8 mg/dL (ref 0.3–1.2)
BLOOD UREA NITROGEN: 14 mg/dL (ref 9–23)
BUN / CREAT RATIO: 22
CALCIUM: 9.2 mg/dL (ref 8.7–10.4)
CHLORIDE: 109 mmol/L — ABNORMAL HIGH (ref 98–107)
CO2: 27.4 mmol/L (ref 20.0–31.0)
CREATININE: 0.64 mg/dL
EGFR CKD-EPI (2021) FEMALE: >90 mL/min/{1.73_m2} (ref >=60)
GLUCOSE RANDOM: 156 mg/dL (ref 70–179)
POTASSIUM: 3.6 mmol/L (ref 3.4–4.8)
PROTEIN TOTAL: 6.2 g/dL (ref 5.7–8.2)
SODIUM: 143 mmol/L (ref 135–145)

## 2022-05-11 LAB — LACTATE DEHYDROGENASE: LACTATE DEHYDROGENASE: 606 U/L — ABNORMAL HIGH (ref 120–246)

## 2022-05-11 MED ADMIN — heparin, porcine (PF) 100 unit/mL injection 500 Units: 500 [IU] | INTRAVENOUS | @ 18:00:00 | Stop: 2022-05-11

## 2022-05-11 MED ADMIN — iohexoL (OMNIPAQUE) 350 mg iodine/mL solution 100 mL: 100 mL | INTRAVENOUS | @ 19:00:00 | Stop: 2022-05-11

## 2022-05-12 ENCOUNTER — Ambulatory Visit
Admit: 2022-05-12 | Discharge: 2022-05-15 | Disposition: A | Discharge status: Home / Self Care | Payer: PRIVATE HEALTH INSURANCE | Admitting: Medical Oncology

## 2022-05-12 ENCOUNTER — Encounter
Admit: 2022-05-12 | Discharge: 2022-05-15 | Disposition: A | Discharge status: Home / Self Care | Payer: PRIVATE HEALTH INSURANCE | Attending: Medical Oncology | Admitting: Medical Oncology

## 2022-05-12 LAB — COMPREHENSIVE METABOLIC PANEL
ALBUMIN: 3.6 g/dL (ref 3.4–5.0)
ALKALINE PHOSPHATASE: 118 U/L — ABNORMAL HIGH (ref 46–116)
ALT (SGPT): 77 U/L — ABNORMAL HIGH (ref 10–49)
ANION GAP: 7 mmol/L (ref 5–14)
AST (SGOT): 44 U/L — ABNORMAL HIGH (ref <=34)
BILIRUBIN TOTAL: 0.7 mg/dL (ref 0.3–1.2)
BLOOD UREA NITROGEN: 14 mg/dL (ref 9–23)
BUN / CREAT RATIO: 25
CALCIUM: 8.9 mg/dL (ref 8.7–10.4)
CHLORIDE: 106 mmol/L (ref 98–107)
CO2: 27 mmol/L (ref 20.0–31.0)
CREATININE: 0.55 mg/dL — ABNORMAL LOW
EGFR CKD-EPI (2021) FEMALE: >90 mL/min/{1.73_m2} (ref >=60)
GLUCOSE RANDOM: 104 mg/dL (ref 70–179)
POTASSIUM: 3.7 mmol/L (ref 3.4–4.8)
PROTEIN TOTAL: 5.9 g/dL (ref 5.7–8.2)
SODIUM: 140 mmol/L (ref 135–145)

## 2022-05-12 LAB — CBC W/ AUTO DIFF
BASOPHILS ABSOLUTE COUNT: 0 10*9/L (ref 0.0–0.1)
BASOPHILS RELATIVE PERCENT: 0.5 %
EOSINOPHILS ABSOLUTE COUNT: 0.1 10*9/L (ref 0.0–0.5)
EOSINOPHILS RELATIVE PERCENT: 4.4 %
HEMATOCRIT: 24.9 % — ABNORMAL LOW (ref 34.0–44.0)
HEMOGLOBIN: 8.7 g/dL — ABNORMAL LOW (ref 11.3–14.9)
LYMPHOCYTES ABSOLUTE COUNT: 1.2 10*9/L (ref 1.1–3.6)
LYMPHOCYTES RELATIVE PERCENT: 64.1 %
MEAN CORPUSCULAR HEMOGLOBIN CONC: 34.8 g/dL (ref 32.0–36.0)
MEAN CORPUSCULAR HEMOGLOBIN: 35.6 pg — ABNORMAL HIGH (ref 25.9–32.4)
MEAN CORPUSCULAR VOLUME: 102.4 fL — ABNORMAL HIGH (ref 77.6–95.7)
MEAN PLATELET VOLUME: 7.8 fL (ref 6.8–10.7)
MONOCYTES ABSOLUTE COUNT: 0.5 10*9/L (ref 0.3–0.8)
MONOCYTES RELATIVE PERCENT: 25.6 %
NEUTROPHILS ABSOLUTE COUNT: 0.1 10*9/L — CL (ref 1.8–7.8)
NEUTROPHILS RELATIVE PERCENT: 5.4 %
PLATELET COUNT: 125 10*9/L — ABNORMAL LOW (ref 150–450)
RED BLOOD CELL COUNT: 2.44 10*12/L — ABNORMAL LOW (ref 3.95–5.13)
RED CELL DISTRIBUTION WIDTH: 22.1 % — ABNORMAL HIGH (ref 12.2–15.2)
WBC ADJUSTED: 1.9 10*9/L — ABNORMAL LOW (ref 3.6–11.2)

## 2022-05-12 LAB — URINALYSIS WITH MICROSCOPY WITH CULTURE REFLEX
BILIRUBIN UA: NEGATIVE
BLOOD UA: NEGATIVE
GLUCOSE UA: NEGATIVE
HYALINE CASTS: 6 /LPF — ABNORMAL HIGH (ref 0–1)
KETONES UA: NEGATIVE
LEUKOCYTE ESTERASE UA: NEGATIVE
NITRITE UA: NEGATIVE
PH UA: 6.5 (ref 5.0–9.0)
PROTEIN UA: 30 — AB
RBC UA: 1 /HPF (ref <=4)
SPECIFIC GRAVITY UA: 1.027 (ref 1.003–1.030)
SQUAMOUS EPITHELIAL: <1 /HPF (ref 0–5)
UROBILINOGEN UA: <2
WBC UA: 2 /HPF (ref 0–5)

## 2022-05-12 LAB — LACTATE, VENOUS, WHOLE BLOOD: LACTATE BLOOD VENOUS: 0.7 mmol/L (ref 0.5–1.8)

## 2022-05-12 LAB — SLIDE REVIEW

## 2022-05-12 MED ADMIN — lactated ringers bolus 1,000 mL: 1000 mL | INTRAVENOUS | @ 21:00:00 | Stop: 2022-05-12

## 2022-05-12 MED ADMIN — cefepime (MAXIPIME) 2 g in sodium chloride 0.9 % (NS) 100 mL IVPB-MBP: 2 g | INTRAVENOUS | @ 23:00:00 | Stop: 2022-05-12

## 2022-05-12 MED ADMIN — vancomycin (VANCOCIN) 1750 mg in sodium chloride (NS) 0.9 % 500 mL IVPB (premix): 1750 mg | INTRAVENOUS | @ 23:00:00 | Stop: 2022-05-12

## 2022-05-12 NOTE — Unmapped (Signed)
I called Ms. Ramsdell in regards to her labs from today that showed ANC 0.1. No changes that she can report, she feels fine overall. Got CT imaging today with impressions that do not sound to support infection but work-up was completed because she had been having fevers with no clear etiology. BMbx on 9/28 shows she is in CR and negative BCR-ABL    Plan:  - HOLD dasatinib 70 mg until ANC recovers. I would not start maintenance (VCR/dasatinib/pred) until her ANC recovers > 1.0. Dose for dasatinib in maintenance is typically 100 mg  - START levofloxacin 500 mg and fluconazole 200 mg daily  - I suggested she do a home test for covid today or tomorrow. She had a dry cough on the phone. Have seen previous cases where neutropenia seems to be correlated. All resp viral tests were negative when tested on 10/5  - Consider repeat labs before she's due to start maintenance on 10/18      Manfred Arch, PharmD, BCOP, CPP  Pager: [phone]

## 2022-05-12 NOTE — Unmapped (Signed)
Patient reports she is here to be admitted. States she had a temp of 101.81F yesterday. Reports being neutropenic. Last tylenol at 6AM.

## 2022-05-12 NOTE — Unmapped (Signed)
[facility]  Emergency Department Physican Note        ED Clinical Impression      Final diagnoses:   Neutropenic fever (CMS-HCC) (Primary)     Patient is a 46 year old female who presents to the emergency department for febrile neutropenia of unknown source.  Has had fever which has bene ongoing in outpatient setting; referred for admission given recurrent nature overnight as well as associated neutropenia. Empiric antibiotics were started with cefepime and vancomycin.  Had CT sinus as well as CT, chest, abdomen, pelvis on 10/5 which demonstrated findings of possible right-sided sinusitis but no other acute findings.  Work-up for etiology has been unrevealing but she has remained stable and afebrile since being in the emergency department.  Chest x-ray without interval changes, RPP negative, urinalysis with moderate bacteria but negative esterase and nitrites, urine culture pending, blood cultures pending.  No indication for cross-sectional imaging given benign abdominal exam.  Patient will be admitted by hematology oncology service for further work-up and treatment of febrile neutropenia.       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  She has had fevers for some time that have been known to her oncology team and has received work-up that has been unrevealing in the past.  Besides a chronic cough, there does not appear to be a clear etiology for her fever.  Imaging including CT sinus, chest, abdomen, and pelvis were grossly unrevealing besides mild diffuse bronchial wall thickening and radiographic evidence of right-sided prior sinusitis.  Differential at this time includes upper respiratory infection, lower respiratory infection, urinary tract infection, medication side effect, occult intra-abdominal infection, and relapsed malignancy.  Exam without any focal findings suggestive of localized infection and patient is well-appearing and in no acute distress.  She is afebrile in the ED and hemodynamically stable but tachycardic to 108, which according to her recent outpatient notes appears to be stable.  Will obtain chest x-ray to assess for interval change, blood cultures, CBC, CMP, UA, RPP, EKG, and start empiric antibiotic coverage with cefepime and vancomycin.    1745: CBC unchanged from prior, CMP stable, lactate within normal limits, urinalysis with moderate bacteria but negative nitrites and leukocyte esterase.  Discussed with heme-onc fellow Dr. Marikay Alar and they will admit patient for febrile neutropenia work-up. MAO paged.    1921: Chest x-ray negative for acute changes.  RPP negative.  Lactate within normal limits.    2248: Differential resulted, ANC unchanged at 0.1 from previous.  Patient admitted for further work-up and management..    Independent Interpretation of Studies    I have independently interpreted the following studies:  EKG: 1702: Normal sinus rhythm without evidence of ischemic changes  CXR: 1921: No evidence of pleural effusion, pneumothorax, lobar pneumonia    Discussion of Management with other Providers or Support Staff    I discussed the management of this patient with the:  Admitting provider: Patient care discussed heme-onc fellow Dr. Otilio Connors and they will admit patient for febrile neutropenia work-up.    Considerations Regarding Disposition/Escalation of Care and Critical Care    Indications for observation/admission (or consideration of observation/admission) and/or appropriateness for outpatient management: Patient is oncology patient with fever and  immunocompromise host.  She warrants inpatient work-up and IV antibiotics at this time.   Patient/Family/Caregiver Discussions: Discussed indications for admission with patient and family and they are agreeable to inpatient care at this time.  Diagnostic Tests Considered But Not Done: Defer cross-sectional imaging given recent outpatient imaging with no change in exam        Portions of this record have been created using Dragon dictation software. Dictation errors have been sought, but may not have been identified and corrected.    See chart and resident provider documentation for details.    ____________________________________________         History        Reason for Visit  Fever Immunocompromised      HPI   Patricia [name] is a 46 y.o. 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  She reports that she has had cyclic low-grade fevers that generally occur in the evening for weeks and her oncology team is aware and has been trying to determine the etiology.  Starting last night she had multiple fevers to 100.6 that responded to Tylenol and this morning fever to 101.3 prompting her to notify her oncology team who recommended she come to the emergency department for admission and further work-up given neutropenia.  At [facility] yesterday she had a CT scan of her sinuses, chest, abdomen, and pelvis as well as labs drawn that showed persistent leukopenia and she was advised to begin fluconazole and levofloxacin prophylaxis and hold her Dasatinib.  She does note a chronic nonproductive cough that she attributes to postnasal drip as well as right-sided sinus pressure and tinnitus. This has not acutely worsened today. She did have a headache and 1 episode of diarrhea this morning.  She has also been experiencing chills and rigors but denies weight loss, chest pain, shortness of breath, abdominal pain, constipation, nausea, vomiting, dysuria, urinary frequency.      Outside Historian(s)  Patient is accompanied by her significant other at the bedside who contributed to the history.    External Records Reviewed  Recent outpatient and inpatient records reviewed.  Patient has a history of recurrent immunocompromise fevers.  Patient was most recently seen by her oncologist on October 5 with plan to transition to maintenance. She has undergone work-up including CT scan of the sinuses, chest, abdomen, and pelvis and then unrevealing of etiology.  She was recently instructed to hold her Dasatinib until her counts recover and start levofloxacin and fluconazole daily.  Echocardiogram from March was normal.    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       Patient Active Problem List   Diagnosis    Leukocytosis    B-cell acute lymphoblastic leukemia (ALL) (CMS-HCC)    Acute cough    Dyspepsia    Acute frontal sinusitis    Pancytopenia (CMS-HCC)    Febrile neutropenia (CMS-HCC)    Blurred vision, bilateral    Anxiety       Past Surgical History:   Procedure Laterality Date    IR INSERT PORT AGE GREATER THAN 5 YRS  12/08/2021    IR INSERT PORT AGE GREATER THAN 5 YRS 12/08/2021 [name], PA IMG VIR [facility]         Current Facility-Administered Medications:     acetaminophen (TYLENOL) tablet 650 mg,  650 mg, Oral, Q6H PRN, Bennie Pierini, MD    albuterol (PROVENTIL HFA;VENTOLIN HFA) 90 mcg/actuation inhaler 2 puff, 2 puff, Inhalation, Q6H PRN, Bennie Pierini, MD    cefepime (MAXIPIME) 2 g in sodium chloride 0.9 % (NS) 100 mL IVPB-MBP, 2 g, Intravenous, Q8H, Corder, Stephanie, MD    cetirizine (ZyrTEC) tablet 10 mg, 10 mg, Oral, Nightly, Corder, Stephanie, MD    dapsone tablet 100 mg, 100 mg, Oral, Daily, Corder, Stephanie, MD    emollient combination no.92 (LUBRIDERM) lotion 1 application., 1 application., Topical, Continuous PRN, Bennie Pierini, MD    enoxaparin (LOVENOX) syringe 40 mg, 40 mg, Subcutaneous, Q24H, Corder, Judeth Cornfield, MD    escitalopram oxalate (LEXAPRO) tablet 10 mg, 10 mg, Oral, Daily, Corder, Judeth Cornfield, MD    fluconazole (DIFLUCAN) tablet 200 mg, 200 mg, Oral, Daily, Corder, Judeth Cornfield, MD    ondansetron (ZOFRAN-ODT) disintegrating tablet 4 mg, 4 mg, Oral, Q8H PRN **OR** ondansetron (ZOFRAN) injection 4 mg, 4 mg, Intravenous, Q8H PRN, Bennie Pierini, MD    valACYclovir (VALTREX) tablet 500 mg, 500 mg, Oral, Daily, Corder, Judeth Cornfield, MD    Current Outpatient Medications:     acetaminophen (TYLENOL 8 HOUR) 650 MG CR tablet, Take 2 tablets (1,300 mg total) by mouth every eight (8) hours as needed for pain., Disp: , Rfl:     albuterol HFA 90 mcg/actuation inhaler, Inhale 2 puffs every six (6) hours as needed for wheezing., Disp: , Rfl:     cetirizine (ZYRTEC) 10 MG tablet, Take 1 tablet (10 mg total) by mouth nightly., Disp: 30 tablet, Rfl: 2    dasatinib (SPRYCEL) 70 MG tablet, Take 1 tablet (70 mg total) by mouth daily., Disp: 30 tablet, Rfl: 7    escitalopram oxalate (LEXAPRO) 10 MG tablet, Take 1 tablet (10 mg total) by mouth daily., Disp: , Rfl:     fluconazole (DIFLUCAN) 200 MG tablet, Take 1 tablet (200 mg total) by mouth daily., Disp: , Rfl:     levalbuterol (XOPENEX) 0.31 mg/3 mL nebulizer solution, Inhale 3 mL (0.31 mg total) by nebulization every four (4) hours as needed for wheezing (cough)., Disp: 45 mL, Rfl: 0    levoFLOXacin (LEVAQUIN) 500 MG tablet, Take 1 tablet (500 mg total) by mouth daily as needed., Disp: , Rfl:     loperamide (IMODIUM A-D) 2 mg tablet, Take 1 tablet (2 mg total) by mouth four (4) times a day as needed for diarrhea., Disp: 120 tablet, Rfl: 1    oxyCODONE (ROXICODONE) 5 MG immediate release tablet, Take 1-2 tablets (5-10 mg total) by mouth every four (4) hours as needed for pain, Disp: , Rfl:     prochlorperazine (COMPAZINE) 10 MG tablet, Take 1 tablet (10 mg total) by mouth every six (6) hours as needed for up to 14 days., Disp: 30 tablet, Rfl: 0    valACYclovir (VALTREX) 500 MG tablet, Take 1 tablet (500 mg total) by mouth daily., Disp: 90 tablet, Rfl: 3    Allergies  Erythromycin, Other, Sulfa (sulfonamide antibiotics), and Azithromycin    History reviewed. No pertinent family history.    Social History  Social History     Tobacco Use    Smoking status: Never    Smokeless tobacco: Never          Physical Exam     ED Triage Vitals   Enc Vitals Group      BP 05/12/22 1246 108/80      Heart Rate 05/12/22 1235 130      SpO2  Pulse --       Resp 05/12/22 1246 20      Temp 05/12/22 1246 37 ??C (98.6 ??F)      Temp Source 05/12/22 1246 Oral      SpO2 05/12/22 1235 97 %      Weight 05/12/22 1246 81.6 kg (180 lb)      Height --       Head Circumference --       Peak Flow --       Pain Score --       Pain Loc --       Pain Edu? --       Excl. in GC? --        Constitutional: Alert and oriented. Well appearing and in no distress.  Eyes: Conjunctivae are normal.  ENT       Head: Normocephalic and atraumatic.       Nose: No congestion.  Mild erythema inferior turbinates.       Mouth/Throat: Mucous membranes are moist.       Neck: No stridor.  Hematological/Lymphatic/Immunilogical: No cervical lymphadenopathy.  Cardiovascular: Normal rate, regular rhythm.  No murmurs, rubs, gallops.  No peripheral edema.  Respiratory: Normal respiratory effort. Breath sounds are normal.  No wheezes, rales, rhonchi.  Gastrointestinal: Soft and nontender.  Musculoskeletal: Normal range of motion in all extremities.  Neurologic: Normal speech and language. No gross focal neurologic deficits are appreciated.  Skin: Skin is warm, dry and intact. No rash noted.  No erythema, fluctuance, tenderness over port site.  Psychiatric: Mood and affect are normal. Speech and behavior are normal.       Radiology     CXR:  No radiographic evidence of acute cardiopulmonary pathology.      Procedures     Sheria Lang B. Jon Billings, MS4  [employer]    I evaluated this patient with Ignacia Palma , MSIV. I attest that I have reviewed the student note and that the components of the history of the present illness, the physical exam, and the assessment and plan documented were performed by me or were performed in my presence by the student where I verified the documentation and performed (or re-performed) the exam and medical decision making.        Bryson Ha, MD  05/13/22 [phone]

## 2022-05-13 LAB — BASIC METABOLIC PANEL
ANION GAP: 6 mmol/L (ref 5–14)
BLOOD UREA NITROGEN: 13 mg/dL (ref 9–23)
BUN / CREAT RATIO: 20
CALCIUM: 8 mg/dL — ABNORMAL LOW (ref 8.7–10.4)
CHLORIDE: 109 mmol/L — ABNORMAL HIGH (ref 98–107)
CO2: 24 mmol/L (ref 20.0–31.0)
CREATININE: 0.64 mg/dL
EGFR CKD-EPI (2021) FEMALE: >90 mL/min/{1.73_m2} (ref >=60)
GLUCOSE RANDOM: 161 mg/dL (ref 70–179)
POTASSIUM: 3.4 mmol/L (ref 3.4–4.8)
SODIUM: 139 mmol/L (ref 135–145)

## 2022-05-13 LAB — CBC W/ AUTO DIFF
BASOPHILS ABSOLUTE COUNT: 0 10*9/L (ref 0.0–0.1)
BASOPHILS RELATIVE PERCENT: 0.5 %
EOSINOPHILS ABSOLUTE COUNT: 0.1 10*9/L (ref 0.0–0.5)
EOSINOPHILS RELATIVE PERCENT: 6.5 %
HEMATOCRIT: 22.6 % — ABNORMAL LOW (ref 34.0–44.0)
HEMOGLOBIN: 7.7 g/dL — ABNORMAL LOW (ref 11.3–14.9)
LYMPHOCYTES ABSOLUTE COUNT: 0.8 10*9/L — ABNORMAL LOW (ref 1.1–3.6)
LYMPHOCYTES RELATIVE PERCENT: 55 %
MEAN CORPUSCULAR HEMOGLOBIN CONC: 34.2 g/dL (ref 32.0–36.0)
MEAN CORPUSCULAR HEMOGLOBIN: 35.4 pg — ABNORMAL HIGH (ref 25.9–32.4)
MEAN CORPUSCULAR VOLUME: 103.5 fL — ABNORMAL HIGH (ref 77.6–95.7)
MEAN PLATELET VOLUME: 7.6 fL (ref 6.8–10.7)
MONOCYTES ABSOLUTE COUNT: 0.5 10*9/L (ref 0.3–0.8)
MONOCYTES RELATIVE PERCENT: 30.2 %
NEUTROPHILS ABSOLUTE COUNT: 0.1 10*9/L — CL (ref 1.8–7.8)
NEUTROPHILS RELATIVE PERCENT: 7.8 %
PLATELET COUNT: 114 10*9/L — ABNORMAL LOW (ref 150–450)
RED BLOOD CELL COUNT: 2.18 10*12/L — ABNORMAL LOW (ref 3.95–5.13)
RED CELL DISTRIBUTION WIDTH: 21.6 % — ABNORMAL HIGH (ref 12.2–15.2)
WBC ADJUSTED: 1.5 10*9/L — ABNORMAL LOW (ref 3.6–11.2)

## 2022-05-13 LAB — HEPATIC FUNCTION PANEL
ALBUMIN: 2.9 g/dL — ABNORMAL LOW (ref 3.4–5.0)
ALKALINE PHOSPHATASE: 100 U/L (ref 46–116)
ALT (SGPT): 62 U/L — ABNORMAL HIGH (ref 10–49)
AST (SGOT): 38 U/L — ABNORMAL HIGH (ref <=34)
BILIRUBIN DIRECT: 0.2 mg/dL (ref 0.00–0.30)
BILIRUBIN TOTAL: 0.7 mg/dL (ref 0.3–1.2)
PROTEIN TOTAL: 5 g/dL — ABNORMAL LOW (ref 5.7–8.2)

## 2022-05-13 LAB — SLIDE REVIEW

## 2022-05-13 LAB — PHOSPHORUS: PHOSPHORUS: 2.5 mg/dL (ref 2.4–5.1)

## 2022-05-13 LAB — MAGNESIUM: MAGNESIUM: 1.7 mg/dL (ref 1.6–2.6)

## 2022-05-13 MED ADMIN — cefepime (MAXIPIME) 2 g in sodium chloride 0.9 % (NS) 100 mL IVPB-MBP: 2 g | INTRAVENOUS | @ 17:00:00 | Stop: 2022-05-20

## 2022-05-13 MED ADMIN — cefepime (MAXIPIME) 2 g in sodium chloride 0.9 % (NS) 100 mL IVPB-MBP: 2 g | INTRAVENOUS | @ 22:00:00 | Stop: 2022-05-20

## 2022-05-13 MED ADMIN — acetaminophen (TYLENOL) tablet 650 mg: 650 mg | ORAL | @ 12:00:00

## 2022-05-13 MED ADMIN — valACYclovir (VALTREX) tablet 500 mg: 500 mg | ORAL | @ 12:00:00

## 2022-05-13 MED ADMIN — filgrastim-aafi (NIVESTYM) injection syringe 480 mcg: 480 ug | SUBCUTANEOUS | @ 18:00:00

## 2022-05-13 MED ADMIN — escitalopram oxalate (LEXAPRO) tablet 10 mg: 10 mg | ORAL | @ 12:00:00

## 2022-05-13 MED ADMIN — cefepime (MAXIPIME) 2 g in sodium chloride 0.9 % (NS) 100 mL IVPB-MBP: 2 g | INTRAVENOUS | @ 06:00:00 | Stop: 2022-05-20

## 2022-05-13 MED ADMIN — fluconazole (DIFLUCAN) tablet 200 mg: 200 mg | ORAL | @ 13:00:00

## 2022-05-13 MED ADMIN — dapsone tablet 100 mg: 100 mg | ORAL | @ 13:00:00

## 2022-05-13 NOTE — Unmapped (Addendum)
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 female whose presentation is complicated by Ph+ ALL in remission that presented to [facility] with fever and neutropenia.     Active Problems  Fever and Neutropenia  Patient reports fevers have been ongoing and working up as an outpatient but now neutropenic driving admission. Outpatient imaging on 05/11/2022 included CT chest which showed mild diffuse bronchial wall thickening, otherwise unremarkable, normal CTAP, and mild mucosal thickening of right inferior sphenoid sinus on CT sinus. No localizing symptoms on exam or history. Will culture and treat with antibiotics and clinically monitor. Blood culture NG48 hours, urine cultures with 10-50k of E faecalis, low-level CMV viremia. Treated with Cefepime for 10/12-10/14, switched to PO Levaquin without subsequent fevers, discharged on 5 day course of Levaquin to cover UTI and rhinosinusitis.     Ph+ ALL  Follows with Dr. Senaida Ores. 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 She has completed 5 cycles of hyperCVAD but had multiple hospital admissions for infections. Transitioned to maintenance therapy w/ prednisone, vincristine, and dasatinib on 05/05/2022. Dasatinib held iso fever, neutropenia. Will hold at discharge and leave restarting it to the outpatient hematology team.     LFT elevation  Mild AST/ALT elevation (44/77), stable from prior.      Cancer Related Fatigue  Patient endorses fatigue related to cancer and chemo.     Allergic Rhinitis  Continued home cetirizine and albuterol PRN.     GAD  Continued home lexapro 10mg  daily.     Pancytopenia due to chemotherapy  Daily CBC.

## 2022-05-13 NOTE — Unmapped (Incomplete)
Pt seen in room ### with approval of the primary RN.  Pts admission was completed with the assistance of an in-person/telehealth interpreter, without family /spouse at the bedside.  An education assessment and initial education were completed.  A password was added to the chart/ declined at this time and a HCDM was [facility] were clarified/declined/ confirmed during the admissions questionnaire.   A flu vaccine was declined/ ordered for the Pt with her approval. The ASQ screening was completed along with a CAGE assessment following the alcohol use screening.    Recent/New VS were taken/ noted during the visit.    BP 117/72  - Pulse 107  - Temp 37.3 ??C (99.2 ??F) (Oral)  - Resp 18  - Wt 81.6 kg (180 lb)  - SpO2 93%  - BMI 29.07 kg/m??     The Pt is rating pain as #/10 located in their back/wound/surgical site.  Pain is controlled to satisfaction and Pt denies any needs at this time.

## 2022-05-13 NOTE — Unmapped (Signed)
Hematology Resident (MEDE) History & Physical    Assessment & Plan:   Patricia [name] is a 46 y.o. female whose presentation is complicated by Ph+ ALL in remission that presented to [facility] with fever and neutropenia.     Principal Problem:    Fever and neutropenia (CMS-HCC)  Active Problems:    B-cell acute lymphoblastic leukemia (ALL) (CMS-HCC)    Pancytopenia (CMS-HCC)  Resolved Problems:    * No resolved hospital problems. *        Active Problems  Fever and Neutropenia Patient reports fevers have been ongoing and working up as an outpatient but now neutropenic driving admission. Outpatient imaging on 05/11/2022 included CT chest which showed mild diffuse bronchial wall thickening, otherwise unremarkable. Normal CTAP  And mild mucosal thickening of right inferior sphenoid sinus on CT sinus. No localizing symptoms on exam or history. Given CT imaging was in the past 48 hours will not repeat now. Will culture and treat with antibiotics and clinically monitor.   - Cefepime (10/12-)  - RPP negative  - fu blood cultures from 10/12  - fu Ucx from 10/12    Ph+ ALL Follows with Dr. Senaida Ores. 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  She has completed 5 cycles of hyperCVAD but had multiple hospital admissions for infections. Transitioned to maintenance therapy w/ prednisone, vincristine, and dasatinib on 05/05/2022. Though was told to hold since she became neutropenic.   - Hold dasatinib   - Continue ppx w/ dapsone, fluconazole, valacyclovir. Hold levaquin while on Cefepime    LFT elevation Mild AST/ALT (44/77) and stable from prior.   - hepatic function panel q72 hours or sooner if worsening    Cancer Related Fatigue patient endorses fatigue related to cancer and chemo    Allergic Rhinitis  - continue home cetirizine and albuterol PRN    GAD continue home lexapro 10mg  daily    Pancytopenia due to chemotherapy  - Daily CBCd    The patient's presentation is complicated by the following clinically significant conditions requiring additional evaluation and treatment: - Thrombocytopenia POA requiring further investigation or monitor     Checklist:  Diet: Regular Diet  DVT PPx: Lovenox 40mg  q24h  Code Status: Full Code  Dispo: Patient appropriate for Observation based on expectation of ongoing need for hospitalization less than two midnights and/or low intensity of services provided    Team Contact Information:   Primary Team: Hematology Resident (MEDE)  Primary Resident: Bennie Pierini, MD  Resident's Pager: [phone] (Hematology Senior Resident)    Chief Concern:   Fever and neutropenia (CMS-HCC)      Subjective:   NAADIRA [name] is a 46 y.o. female with pertinent PMHx of Ph+ ALL presenting with fever and neutropenia.     History obtained by Patient.     HPI:  Patient reports she has been having fever for weeks and working up as an outpatient without any source. However, she had 3 fevers in one day and now is neutropenic so told to come in.     Denies any source. Tmax was 101.3.   She has a chronic cough but no new cough  She had a mild headache and diarrhea this AM but improved. And has not returned    Denies chest pain, SOB, abdominal pain, n/v, rash, mucositis.     Has had hemorrhoids but doesn't think she has a perirectal abscess.  In the ED:  Vitals: Temp 98.6, HR 130, BP 108/80, RR 20, SpO2 of 97 on RA  Labs: CBC w/ pancytopenia WBC 1.9, Hgb 8.7, Plta 125, diff pending. CMP w/ baseline cr. Mild LFT elevation to 44/77/118. Otherwise unremarkable. UA w/ protein and moderate bacteria but - LE, nitrites, WBCs. Normal lactate.   Cultures: RPP negative  EKG: Normal  Imaging: CXR unremarkable. CT chest w/ mild diffuse bronchial wall thickeninc. CTAP unremarkable. CT sinus w/ small mucosal thicking on right inferior sphenoid sinus (prior sinusitis)  Interventions: 1L LR and 2g Cefepime. 1.75g Vanc.     Oncology History Overview Note   Referring/Local Oncologist:    Diagnosis:   10/29/2021  Bone marrow, left iliac, aspiration and biopsy  -  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 The majority of the abnormal cells (64/88) contained an additional BCR/ABL1 fusion signal, while 9/88 abnormal cells contained an additional ABL1 and ASS1 signal.       Genetics:   Karyotype/FISH:   RESULTS   Date Value Ref Range Status   10/28/2021   Final    NOTE: This report reflects a combined study from a peripheral blood and a bone marrow core biopsy. Eleven cells from the peripheral blood and nine cells from the bone marrow core biopsy were analyzed. The BCR/ABL1 FISH analysis was performed on the peripheral blood.     Abnormal Karyotype:  47,XX,t(9;22)(q34;q11.2),+der(22)t(9;22)[18]/46,XX[2]    Abnormal FISH:  A BCR/ABL1 interphase FISH assay showed a signal pattern consistent with a BCR::ABL1 rearrangement and the 9;22 translocation in 88% of the 100 cells scored. The majority of the abnormal cells (64/88) contained an additional BCR/ABL1 fusion signal, while 9/88 abnormal cells contained an additional ABL1 and ASS1 signal.           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  MRD by flow insufficient, p190 2/100,000  12/10/2021: Cycle 2 B cycle + rituximab  12/14/21: IT#4  12/17/21: IT#5  01/10/22: Cycle 3 A cycle + rituximab  01/24/22: Bmbx - MRD-neg by PCR (p190)   02/17/22: C4 B cycle + ritux  02/18/22: IT#6  03/21/22: C5 A cycle + ritux (4th dose)  03/22/22: IT#7  04/29/22: Bmbx - MRD-neg by PCR (p190)   05/09/22: Start maintenance        B-cell acute lymphoblastic leukemia (ALL) (CMS-HCC)   10/29/2021 Initial Diagnosis    B-cell acute lymphoblastic leukemia (ALL) (CMS-HCC)     10/30/2021 - 03/30/2022 Chemotherapy    IP/OP LEUKEMIA GRAAPH-2005 < 60 YO (OP PEGFILGRASTIM ON DAY 7)  [No description for this plan]     01/06/2022 Endocrine/Hormone Therapy    OP LEUPROLIDE (LUPRON) 11.25 MG EVERY 3 MONTHS  Plan Provider: [name], MD     05/18/2022 -  Chemotherapy    OP LEUKEMIA VINCRISTINE  vinCRIStine 2 mg IV on day 1  Pertinent Surgical Hx  port    Pertinent Family Hx  none    Pertinent Social Hx   Married. Denies tobacco use. Denies alcohol use    Allergies  Erythromycin, Other, Sulfa (sulfonamide antibiotics), and Azithromycin    I reviewed the Medication List. The current list is Accurate  Prior to Admission medications    Medication Dose, Route, Frequency   acetaminophen (TYLENOL 8 HOUR) 650 MG CR tablet 1,300 mg, Oral, Every 8 hours PRN   albuterol HFA 90 mcg/actuation inhaler 2 puffs, Inhalation, Every 6 hours PRN   cetirizine (ZYRTEC) 10 MG tablet 10 mg, Oral, Nightly   dasatinib (SPRYCEL) 70 MG tablet 70 mg, Oral, Daily (standard)   escitalopram oxalate (LEXAPRO) 10 MG tablet 10 mg, Oral, Daily (standard)   fluconazole (DIFLUCAN) 200 MG tablet 200 mg, Oral, Daily (standard)   levalbuterol (XOPENEX) 0.31 mg/3 mL nebulizer solution 1 ampule, Nebulization, Every 4 hours PRN   levoFLOXacin (LEVAQUIN) 500 MG tablet 500 mg, Oral, Daily PRN   loperamide (IMODIUM A-D) 2 mg tablet 2 mg, Oral, 4 times a day PRN   oxyCODONE (ROXICODONE) 5 MG immediate release tablet Take 1-2 tablets (5-10 mg total) by mouth every four (4) hours as needed for pain   prochlorperazine (COMPAZINE) 10 MG tablet 10 mg, Oral, Every 6 hours PRN   valACYclovir (VALTREX) 500 MG tablet 500 mg, Oral, Daily (standard)       Designated Healthcare Decision Maker:  Ms. Carandang currently has decisional capacity for healthcare decision-making and is able to designate a surrogate healthcare decision maker. Ms. Ratterman designated healthcare decision maker(s) is/are [name] (the patient's spouse) as denoted by stated patient preference.    Objective:   Physical Exam:  Temp:  [36.7 ??C (98.1 ??F)-37.7 ??C (99.8 ??F)] 37.6 ??C (99.7 ??F)  Heart Rate:  [97-130] 113  SpO2 Pulse:  [98] 98  Resp:  [16-20] 18  BP: (108-120)/(72-80) 120/80  SpO2:  [93 %-100 %] 93 %    Gen: NAD, converses in NAD  Eyes: Sclera anicteric, EOMI grossly normal   HENT: Atraumatic, normocephalic. MMM no erythema  Neck: Trachea midline  Heart: RRR  Lungs: CTAB, no crackles or wheezes  Abdomen: Soft, NTND  Extremities: No edema  Neuro: Grossly symmetric, non-focal    Rectal: hemorrhoid but no perirectal abscess  Skin:  No rashes, lesions on clothed exam. Port c/d/i  Psych: Alert, oriented     Horris Latino, MD PGY-3  [phone]

## 2022-05-13 NOTE — Unmapped (Signed)
Patient admitted to unit, plan of care reviewed, all questions answered.    Problem: Adult Inpatient Plan of Care  Goal: Plan of Care Review  Outcome: Progressing  Goal: Patient-Specific Goal (Individualized)  Outcome: Progressing  Goal: Absence of Hospital-Acquired Illness or Injury  Outcome: Progressing  Intervention: Identify and Manage Fall Risk  Recent Flowsheet Documentation  Taken 05/13/2022 1335 by Regina Eck, RN  Safety Interventions:   lighting adjusted for tasks/safety   low bed   nonskid shoes/slippers when out of bed  Intervention: Prevent Infection  Recent Flowsheet Documentation  Taken 05/13/2022 1335 by Regina Eck, RN  Infection Prevention:   environmental surveillance performed   hand hygiene promoted  Goal: Optimal Comfort and Wellbeing  Outcome: Progressing  Goal: Readiness for Transition of Care  Outcome: Progressing  Goal: Rounds/Family Conference  Outcome: Progressing

## 2022-05-13 NOTE — Unmapped (Signed)
Adult Nutrition Assessment Note    Visit Type: MD Consult  Reason for Visit: Assessment (Nutrition)      HPI & PMH:  Patricia [name] is a 46 y.o. 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    Past Medical History:   Diagnosis Date   ??? 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   Usual Body Weight: +/- 5#'s from 180#     Weight history prior to admission: 2.7% loss since 05/05/22-significant; 5.9% loss since 02/20/22;   Wt Readings from Last 10 Encounters:   05/13/22 79.4 kg (175 lb)   05/05/22 81.8 kg (180 lb 5.4 oz)   04/20/22 80 kg (176 lb 5.9 oz)   04/07/22 81.3 kg (179 lb 3.7 oz)   03/30/22 82.1 kg (180 lb 14.2 oz)   03/25/22 83.2 kg (183 lb 6.8 oz)   03/10/22 81.7 kg (180 lb 1.9 oz)   03/04/22 81.2 kg (179 lb 0.2 oz)   02/22/22 85 kg (187 lb 4.8 oz)   02/20/22 84.7 kg (186 lb 11.7 oz)        Weight changes this admission:   Last 5 Recorded Weights    05/12/22 1246 05/13/22 0040   Weight: 81.6 kg (180 lb) 79.4 kg (175 lb)        Nutrition Focused Physical Exam:             Nutrition Evaluation  Overall Impressions: Nutrition-Focused Physical Exam not indicated due to lack of malnutrition risk factors. (05/13/22 1020)  Nutrition Designation: Overweight (BMI 25.00 - 29.99 kg/m2) (05/13/22 1020)         NUTRITIONALLY RELEVANT DATA     Medications:   Nutritionally pertinent medications reviewed and evaluated for potential food and/or medication interactions.     Labs:   Nutritionally pertinent labs reviewed.     Nutrition History:   May 13, 2022: Prior to admission: Pt reports overall eating well. Despite have fevers no nutrition concerns. She reports her weight usually fluctuates +/-5#'s. She is currently staying away from restaurant foods due to her low blood cell counts.     Allergies, Intolerances, Sensitivities, and/or Cultural/Religious Dietary Restrictions: none identified at this time     Current Nutrition:  Oral intake          Nutritional Needs:   Healthy balance of carbohydrate, protein, and fat.       Malnutrition Assessment using AND/ASPEN Clinical Characteristics:    Patient does not meet AND/ASPEN criteria for malnutrition at this time (05/13/22 1021)       GOALS and EVALUATION     ??? Patient to consume 75% or greater of po intake via combination of meals, snacks, and/or oral supplements within 2 weeks.  - New and Meeting    Motivation, Barriers, and Compliance:  Evaluation of motivation, barriers, and compliance completed. No concerns identified at this time.     NUTRITION ASSESSMENT     ??? ANC (0.1) staff to continue follow food safety guidelines  ??? Pt weight documentation consistent with report of her usual weight fluctuations. PO intake will have continued close monitoring this admission.       Discharge  Planning:   Monitor via CAPP rounds for any discharge planning needs.  Monitor for potential discharge needs with multi-disciplinary team.     Was the nutrition care plan completed? no      NUTRITION INTERVENTIONS and RECOMMENDATION     1. Continue regular diet  2. Pt continue follow independent food safety guidelines  3. Weigh Bi-Weekly    Follow-Up Parameters:   1-2 times per 4 week period (and more frequent as indicated)    Ed Blalock, MS, RD, CSO, LDN  Pager # [phone]

## 2022-05-13 NOTE — Unmapped (Signed)
Problem: Adult Inpatient Plan of Care  Goal: Plan of Care Review  Outcome: Ongoing - Unchanged  Goal: Patient-Specific Goal (Individualized)  Outcome: Ongoing - Unchanged  Goal: Absence of Hospital-Acquired Illness or Injury  Outcome: Ongoing - Unchanged  Goal: Optimal Comfort and Wellbeing  Outcome: Ongoing - Unchanged  Goal: Readiness for Transition of Care  Outcome: Ongoing - Unchanged  Goal: Rounds/Family Conference  Outcome: Ongoing - Unchanged

## 2022-05-14 LAB — BASIC METABOLIC PANEL
ANION GAP: 7 mmol/L (ref 5–14)
BLOOD UREA NITROGEN: 11 mg/dL (ref 9–23)
BUN / CREAT RATIO: 17
CALCIUM: 8.6 mg/dL — ABNORMAL LOW (ref 8.7–10.4)
CHLORIDE: 109 mmol/L — ABNORMAL HIGH (ref 98–107)
CO2: 27 mmol/L (ref 20.0–31.0)
CREATININE: 0.65 mg/dL
EGFR CKD-EPI (2021) FEMALE: >90 mL/min/{1.73_m2} (ref >=60)
GLUCOSE RANDOM: 75 mg/dL (ref 70–179)
POTASSIUM: 4 mmol/L (ref 3.4–4.8)
SODIUM: 143 mmol/L (ref 135–145)

## 2022-05-14 LAB — CBC W/ AUTO DIFF
BASOPHILS ABSOLUTE COUNT: 0 10*9/L (ref 0.0–0.1)
BASOPHILS RELATIVE PERCENT: 0.3 %
EOSINOPHILS ABSOLUTE COUNT: 0.2 10*9/L (ref 0.0–0.5)
EOSINOPHILS RELATIVE PERCENT: 4.8 %
HEMATOCRIT: 22.3 % — ABNORMAL LOW (ref 34.0–44.0)
HEMOGLOBIN: 7.9 g/dL — ABNORMAL LOW (ref 11.3–14.9)
LYMPHOCYTES ABSOLUTE COUNT: 1.2 10*9/L (ref 1.1–3.6)
LYMPHOCYTES RELATIVE PERCENT: 36.8 %
MEAN CORPUSCULAR HEMOGLOBIN CONC: 35.2 g/dL (ref 32.0–36.0)
MEAN CORPUSCULAR HEMOGLOBIN: 36.2 pg — ABNORMAL HIGH (ref 25.9–32.4)
MEAN CORPUSCULAR VOLUME: 103 fL — ABNORMAL HIGH (ref 77.6–95.7)
MEAN PLATELET VOLUME: 7.6 fL (ref 6.8–10.7)
MONOCYTES ABSOLUTE COUNT: 0.8 10*9/L (ref 0.3–0.8)
MONOCYTES RELATIVE PERCENT: 24.1 %
NEUTROPHILS ABSOLUTE COUNT: 1.1 10*9/L — ABNORMAL LOW (ref 1.8–7.8)
NEUTROPHILS RELATIVE PERCENT: 34 %
PLATELET COUNT: 132 10*9/L — ABNORMAL LOW (ref 150–450)
RED BLOOD CELL COUNT: 2.17 10*12/L — ABNORMAL LOW (ref 3.95–5.13)
RED CELL DISTRIBUTION WIDTH: 21.4 % — ABNORMAL HIGH (ref 12.2–15.2)
WBC ADJUSTED: 3.3 10*9/L — ABNORMAL LOW (ref 3.6–11.2)

## 2022-05-14 LAB — SLIDE REVIEW

## 2022-05-14 LAB — CMV DNA, QUANTITATIVE, PCR
CMV QUANT LOG10: 2.5 {Log_IU}/mL — ABNORMAL HIGH (ref <0.00)
CMV QUANT: 318 [IU]/mL — ABNORMAL HIGH (ref <0)
CMV VIRAL LD: DETECTED — AB

## 2022-05-14 LAB — PHOSPHORUS: PHOSPHORUS: 3.9 mg/dL (ref 2.4–5.1)

## 2022-05-14 LAB — MAGNESIUM: MAGNESIUM: 2 mg/dL (ref 1.6–2.6)

## 2022-05-14 MED ADMIN — dapsone tablet 100 mg: 100 mg | ORAL | @ 13:00:00

## 2022-05-14 MED ADMIN — levoFLOXacin (LEVAQUIN) tablet 750 mg: 750 mg | ORAL | @ 14:00:00 | Stop: 2022-05-17

## 2022-05-14 MED ADMIN — filgrastim-aafi (NIVESTYM) injection syringe 480 mcg: 480 ug | SUBCUTANEOUS | @ 14:00:00 | Stop: 2022-05-14

## 2022-05-14 MED ADMIN — acetaminophen (TYLENOL) tablet 650 mg: 650 mg | ORAL | @ 12:00:00

## 2022-05-14 MED ADMIN — escitalopram oxalate (LEXAPRO) tablet 10 mg: 10 mg | ORAL | @ 13:00:00

## 2022-05-14 MED ADMIN — valACYclovir (VALTREX) tablet 500 mg: 500 mg | ORAL | @ 13:00:00

## 2022-05-14 MED ADMIN — cetirizine (ZyrTEC) tablet 10 mg: 10 mg | ORAL | @ 13:00:00

## 2022-05-14 MED ADMIN — cefepime (MAXIPIME) 2 g in sodium chloride 0.9 % (NS) 100 mL IVPB-MBP: 2 g | INTRAVENOUS | @ 06:00:00 | Stop: 2022-05-14

## 2022-05-14 NOTE — Unmapped (Signed)
Hematology ([facility]) Progress Note    Assessment & Plan:   Patricia [name] is a 46 y.o. female whose presentation is complicated by Ph+ ALL in remission that presented to [facility] with fever and neutropenia.     Principal Problem:    Fever and neutropenia (CMS-HCC)  Active Problems:    B-cell acute lymphoblastic leukemia (ALL) (CMS-HCC)    Pancytopenia (CMS-HCC)  Resolved Problems:    * No resolved hospital problems. *        Active Problems    Fever and Neutropenia Patient reports fevers have been ongoing and working up as an outpatient but now neutropenic driving admission. Outpatient imaging on 05/11/2022 included CT chest which showed mild diffuse bronchial wall thickening, otherwise unremarkable. Normal CTAP and mild mucosal thickening of right inferior sphenoid sinus on CT sinus. No localizing symptoms on exam or history. Given CT imaging was in the past 48 hours will not repeat now. Will culture and treat with antibiotics and clinically monitor.   - Cefepime (10/12- )  - RPP negative  - fu blood cultures from 10/12  - UCx 10/12 growing 10k-50k E faecalis --> call micro to add susceptibility testing  - Check serum HSV RNA, CMV RNA   - Filgrastim x1 (10/13)  - appropriate for inpatient status to monitor fevers in s/o neutropenia and infectious workup management      Ph+ ALL Follows with Dr. Senaida Ores. 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  She has completed 5 cycles of hyperCVAD but had multiple hospital admissions for infections. Transitioned to maintenance therapy w/ prednisone, vincristine, and dasatinib on 05/05/2022. Though was told to hold since she became neutropenic.   - Hold dasatinib   - Continue ppx w/ dapsone, fluconazole, valacyclovir. Hold levaquin while on Cefepime     LFT elevation Mild AST/ALT (44/77) and stable from prior.   - hepatic function panel q72 hours or sooner if worsening     Cancer Related Fatigue patient endorses fatigue related to cancer and chemo     Allergic Rhinitis  - continue home cetirizine and albuterol PRN     GAD continue home lexapro 10mg  daily     Pancytopenia due to chemotherapy  - Daily CBC      Daily Checklist:  Diet: Regular Diet  DVT PPx: Not Indicated - Padua Score <4  Electrolytes: Replete Potassium to >/=4 and Magnesium to >/=2  Code Status: Full Code  Dispo: Transfer to Floor    Team Contact Information:   Primary Team: Hematology Resident ([facility])  Primary Resident: Carter Kitten, MD, MD  Resident's Pager: [phone] (Hematology Intern - Alvester Morin)    Interval History:   No acute events overnight. Remains afebrile. On empiric cefepime. Ongoing infectious workup in s/o recent persistent fevers and neutropenia. Ucx growing 10-50k E faecalis - will follow-up susceptibility testing.  No other complaints.    ROS: Denies headache, chest pain, shortness of breath, abdominal pain, nausea, vomiting.    Objective:   Temp:  [36.4 ??C (97.5 ??F)-37.7 ??C (99.8 ??F)] 36.9 ??C (98.4 ??F)  Heart Rate:  [89-113] 103  Resp:  [17-19] 18  BP: (110-124)/(72-84) 120/84  SpO2:  [93 %-96 %] 96 %    Gen: NAD, converses, well appearing   HENT: atraumatic, normocephalic  Heart: Mildly tachycardic rate, no edema  Lungs: Normal WOB on RA, speaking in full sentences, no accessory muscle use  Abdomen: ND  Extremities:  No edema  Neuro: Alert and oriented x4, no focal deficit  Psych: Pleasant mood and affect

## 2022-05-14 NOTE — Unmapped (Signed)
Hematology ([facility]) Progress Note    Assessment & Plan:   Patricia [name] is a 46 y.o. 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     Principal Problem:    Fever and neutropenia (CMS-HCC)  Active Problems:    B-cell acute lymphoblastic leukemia (ALL) (CMS-HCC)    Pancytopenia (CMS-HCC)  Resolved Problems:    * No resolved hospital problems. *        Active Problems    Fever and Neutropenia Patient reports fevers have been ongoing and working up as an outpatient but now neutropenic driving admission. Outpatient imaging on 05/11/2022 included CT chest which showed mild diffuse bronchial wall thickening, otherwise unremarkable. Normal CTAP and mild mucosal thickening of right inferior sphenoid sinus on CT sinus. No localizing symptoms on exam or history. Blood cultures NGTD, RPP negative. Urine culture with 10-50k of E faecalis, CMV with low-level viremia of 318. Given CT imaging was in the past 48 hours will not repeat now. Will culture, treat with antibiotics, and clinically monitor. Cefepime from 10/12-14.  - Start PO Levaquin 10/14 in anticipation of 10/15 discharge  - fu blood cultures from 10/12  - Call micro to add susceptibility testing of E Faecalis  - Follow-up serum HSV RNA  - Filgrastim x1 10/13, again 10/14      Ph+ ALL   Follows with Dr. Senaida Ores. 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 She has completed 5 cycles of hyperCVAD but had multiple hospital admissions for infections. Transitioned to maintenance therapy w/ prednisone, vincristine, and dasatinib on 05/05/2022. Though was told to hold since she became neutropenic. With good response in ANC to 1 dose of Filgrastim.  - Hold Dasatinib iso fevers, neutropenia   - Continue ppx w/ dapsone, valacyclovir  - Stop fluconazole given recovery of ANC > 1.0     LFT elevation   Mild AST/ALT (44/77) and stable from prior.   - Hepatic function panel q72 hours or sooner if worsening     Cancer Related Fatigue  Patient endorses fatigue related to cancer and chemo.     Allergic Rhinitis  - Continue home cetirizine and albuterol PRN     GA  - Continue home lexapro 10mg  daily     Pancytopenia due to chemotherapy  - Daily CBC      Daily Checklist:  Diet: Regular Diet  DVT PPx: Not Indicated - Padua Score <4  Electrolytes: Replete Potassium to >/=4 and Magnesium to >/=2  Code Status: Full Code  Dispo: Transfer to Floor    Team Contact Information:   Primary Team: Hematology Resident ([facility])  Primary Resident: Cydney Ok, MD  Resident's Pager: [phone] (Hematology Intern - Alvester Morin)    Interval History:   No acute events overnight.     Remains afebrile. No symptomatic concerns from patient, she is happy with how her blood counts recovered after one dose of Filgrastim.    ROS: Denies headache, chest pain, shortness of breath, abdominal pain, nausea, vomiting.    Objective:   Temp:  [36.8 ??C (98.2 ??F)-37.1 ??C (98.8 ??F)] 36.8 ??C (98.2 ??F)  Heart Rate:  [83-103] 83  Resp:  [14-18] 18  BP: (112-122)/(84-94) 116/86  SpO2:  [94 %-98 %] 98 %    Gen: NAD, converses, well appearing   HENT: atraumatic, normocephalic  Heart: RRR  Lungs: Normal WOB on RA, speaking in full sentences, no accessory muscle use  Abdomen: SNTND  Extremities: No edema  Neuro: Alert and oriented x4, no focal deficit  Psych: Pleasant mood and affect

## 2022-05-14 NOTE — Unmapped (Signed)
Afebrile, IV ABTs discontinued and oral Levaquin given as ordered. Up in room with steady gait, No falls or injuries. Husband into visit. Appetite good. Updated in POC PRN   Problem: Adult Inpatient Plan of Care  Goal: Plan of Care Review  Outcome: Progressing  Goal: Patient-Specific Goal (Individualized)  Outcome: Ongoing - Unchanged  Goal: Absence of Hospital-Acquired Illness or Injury  Outcome: Ongoing - Unchanged  Intervention: Identify and Manage Fall Risk  Recent Flowsheet Documentation  Taken 05/14/2022 0730 by Silvano Bilis, RN  Safety Interventions:   low bed   neutropenic precautions   nonskid shoes/slippers when out of bed  Goal: Optimal Comfort and Wellbeing  Outcome: Ongoing - Unchanged  Goal: Readiness for Transition of Care  Outcome: Ongoing - Unchanged  Goal: Rounds/Family Conference  Outcome: Ongoing - Unchanged     Problem: Behavioral Health Comorbidity  Goal: Maintenance of Behavioral Health Symptom Control  Outcome: Ongoing - Unchanged

## 2022-05-14 NOTE — Unmapped (Incomplete)
Problem: Adult Inpatient Plan of Care  Goal: Plan of Care Review  Outcome: Progressing  Flowsheets (Taken 05/14/2022 0328)  Progress: improving  Plan of Care Reviewed With: patient  Note:   .  IV Access:   Name Placement date and time Site Days   Power Port A Cath - Double Hub 12/08/21 @ 1222  Right Internal jugular 156     Vitals:    05/13/22 2004 05/14/22 0034 05/14/22 0311   BP: 114/84 122/94 112/86   Pulse: 97 95 89   Resp: 16 16 14    Temp: 37 ??C (98.6 ??F) 37.1 ??C (98.8 ??F) 37.1 ??C (98.7 ??F)   TempSrc: Oral Oral Oral   SpO2: 96% 98% 95%     Recent Labs     05/11/22  1347 05/12/22  1634 05/13/22  0306 05/14/22  0035   ANC 0.1* 0.1* 0.1* 1.1*   PLT 126* 125* 114* 132*   HGB 9.2* 8.7* 7.7* 7.9*   K 3.6 3.7 3.4 4.0   MG  --   --  1.7 2.0   PHOS  --   --  2.5 3.9   LDH 606*  --   --   --    NA 143 140 139 143   GLU 156 104 161 75         PRN Meds: -  Continuous Infusions: -      LBM: 10/13    I/O this shift:  In: 400 [IV Piggyback:400]  Out: -   Scheduled Meds:   cefepime  2 g IV Q8H        Goal: Patient-Specific Goal (Individualized)  Outcome: Progressing  Flowsheets (Taken 05/14/2022 0328)  Patient-Specific Goals (Include Timeframe): patient will remain afebrile overnight  Individualized Care Needs: care clustered, sleep promoted, labs posted, CVAD care, CLABSI prevention, reassurance, education provided, rapport-building  Anxieties, Fears or Concerns: cause of neutropenia?, time it will take to recover counts, cause of fever, effect on ongoing treatment plan (remaining cycles, maintenance chemo, etc)  Goal: Absence of Hospital-Acquired Illness or Injury  Outcome: Progressing  Intervention: Identify and Manage Fall Risk  Recent Flowsheet Documentation  Taken 05/13/2022 1907 by Theophilus Kinds, RN  Safety Interventions:   lighting adjusted for tasks/safety   low bed   neutropenic precautions   infection management   nonskid shoes/slippers when out of bed  Intervention: Prevent Skin Injury  Recent Flowsheet Documentation  Taken 05/13/2022 1907 by Theophilus Kinds, RN  Skin Protection:   incontinence pads utilized   adhesive use limited  Intervention: Prevent and Manage VTE (Venous Thromboembolism) Risk  Recent Flowsheet Documentation  Taken 05/13/2022 1907 by Theophilus Kinds, RN  Activity Management:   activity adjusted per tolerance   ambulated in room   up ad lib  Intervention: Prevent Infection  Recent Flowsheet Documentation  Taken 05/13/2022 1907 by Theophilus Kinds, RN  Infection Prevention:   environmental surveillance performed   equipment surfaces disinfected   hand hygiene promoted   personal protective equipment utilized   rest/sleep promoted   single patient room provided   visitors restricted/screened  Goal: Optimal Comfort and Wellbeing  Outcome: Progressing  Intervention: Monitor Pain and Promote Comfort  Flowsheets (Taken 05/14/2022 0328)  Pain Management Interventions:   care clustered   quiet environment facilitated   relaxation techniques promoted   pain management plan reviewed with patient/caregiver  Intervention: Provide Person-Centered Care  Flowsheets (Taken 05/14/2022 0328)  Trust Relationship/Rapport:   care explained   choices provided  emotional support provided   empathic listening provided   questions answered   questions encouraged   reassurance provided   thoughts/feelings acknowledged

## 2022-05-15 LAB — CBC W/ AUTO DIFF
BASOPHILS ABSOLUTE COUNT: 0.1 10*9/L (ref 0.0–0.1)
BASOPHILS RELATIVE PERCENT: 0.9 %
EOSINOPHILS ABSOLUTE COUNT: 0.2 10*9/L (ref 0.0–0.5)
EOSINOPHILS RELATIVE PERCENT: 2.2 %
HEMATOCRIT: 21.8 % — ABNORMAL LOW (ref 34.0–44.0)
HEMOGLOBIN: 7.7 g/dL — ABNORMAL LOW (ref 11.3–14.9)
LYMPHOCYTES ABSOLUTE COUNT: 1.7 10*9/L (ref 1.1–3.6)
LYMPHOCYTES RELATIVE PERCENT: 19.7 %
MEAN CORPUSCULAR HEMOGLOBIN CONC: 35.2 g/dL (ref 32.0–36.0)
MEAN CORPUSCULAR HEMOGLOBIN: 36.5 pg — ABNORMAL HIGH (ref 25.9–32.4)
MEAN CORPUSCULAR VOLUME: 103.7 fL — ABNORMAL HIGH (ref 77.6–95.7)
MEAN PLATELET VOLUME: 7.7 fL (ref 6.8–10.7)
MONOCYTES ABSOLUTE COUNT: 1.4 10*9/L — ABNORMAL HIGH (ref 0.3–0.8)
MONOCYTES RELATIVE PERCENT: 16 %
NEUTROPHILS ABSOLUTE COUNT: 5.3 10*9/L (ref 1.8–7.8)
NEUTROPHILS RELATIVE PERCENT: 61.2 %
PLATELET COUNT: 129 10*9/L — ABNORMAL LOW (ref 150–450)
RED BLOOD CELL COUNT: 2.11 10*12/L — ABNORMAL LOW (ref 3.95–5.13)
RED CELL DISTRIBUTION WIDTH: 20.9 % — ABNORMAL HIGH (ref 12.2–15.2)
WBC ADJUSTED: 8.6 10*9/L (ref 3.6–11.2)

## 2022-05-15 LAB — BASIC METABOLIC PANEL
ANION GAP: 8 mmol/L (ref 5–14)
BLOOD UREA NITROGEN: 10 mg/dL (ref 9–23)
BUN / CREAT RATIO: 17
CALCIUM: 8.9 mg/dL (ref 8.7–10.4)
CHLORIDE: 108 mmol/L — ABNORMAL HIGH (ref 98–107)
CO2: 26 mmol/L (ref 20.0–31.0)
CREATININE: 0.6 mg/dL
EGFR CKD-EPI (2021) FEMALE: >90 mL/min/{1.73_m2} (ref >=60)
GLUCOSE RANDOM: 85 mg/dL (ref 70–179)
POTASSIUM: 3.7 mmol/L (ref 3.4–4.8)
SODIUM: 142 mmol/L (ref 135–145)

## 2022-05-15 LAB — MAGNESIUM: MAGNESIUM: 1.9 mg/dL (ref 1.6–2.6)

## 2022-05-15 LAB — SLIDE REVIEW

## 2022-05-15 LAB — PHOSPHORUS: PHOSPHORUS: 3.1 mg/dL (ref 2.4–5.1)

## 2022-05-15 MED ADMIN — dapsone tablet 100 mg: 100 mg | ORAL | @ 13:00:00 | Stop: 2022-05-15

## 2022-05-15 MED ADMIN — levoFLOXacin (LEVAQUIN) tablet 750 mg: 750 mg | ORAL | @ 13:00:00 | Stop: 2022-05-15

## 2022-05-15 MED ADMIN — acetaminophen (TYLENOL) tablet 650 mg: 650 mg | ORAL | @ 04:00:00

## 2022-05-15 MED ADMIN — escitalopram oxalate (LEXAPRO) tablet 10 mg: 10 mg | ORAL | @ 13:00:00 | Stop: 2022-05-15

## 2022-05-15 MED ADMIN — valACYclovir (VALTREX) tablet 500 mg: 500 mg | ORAL | @ 13:00:00 | Stop: 2022-05-15

## 2022-05-15 MED ADMIN — cetirizine (ZyrTEC) tablet 10 mg: 10 mg | ORAL | @ 13:00:00 | Stop: 2022-05-15

## 2022-05-15 NOTE — Unmapped (Signed)
Problem: Adult Inpatient Plan of Care  Goal: Plan of Care Review  Outcome: Progressing  Flowsheets (Taken 05/15/2022 0229)  Progress: improving  Plan of Care Reviewed With: patient  Note:   [name]. C/o lower back spasm/tweak patient attributes to ED bed- administered tylenol to good effect.     IV Access:   Name Placement date / time Site Days   Power Port A Cath - Double Hub 12/08/21 @ 1222  Right Internal jugular 157     Vitals:    05/14/22 2004 05/14/22 2348 05/15/22 0506   BP: 120/76 119/88 118/90   Pulse: 85 91 85   Resp: 16 14 16    Temp: 37 ??C (98.6 ??F) 37.1 ??C (98.8 ??F) 36.9 ??C (98.5 ??F)   T Src: Oral Oral Oral   SpO2: 96% 95% 91%     Recent Labs     05/12/22  1634 05/13/22  0306 05/14/22  0035 05/15/22  0000   ANC 0.1* 0.1* 1.1*  --    PLT 125* 114* 132* 129*   HGB 8.7* 7.7* 7.9* 7.7*   K 3.7 3.4 4.0 3.7   MG  --  1.7 2.0 1.9   PHOS  --  2.5 3.9 3.1   NA 140 139 143 142   GLU 104 161 75 85         PRN Meds:  Acetaminophen x1 for lower back tension/spasming  Continuous Infusions: -      LBM:  10/14  Scheduled Meds: -     Goal: Patient-Specific Goal (Individualized)  Outcome: Progressing  Flowsheets (Taken 05/15/2022 0229)  Patient-Specific Goals (Include Timeframe): patient will remain afebrile overnight on oral antibiotics  Individualized Care Needs: care clustered, sleep promoted, labs posted, CVAD care, CLABSI prevention, reassurance, education provided, rapport-building, pain mgmt plan  Anxieties, Fears or Concerns: effect on ongoing treatment plan (remaining cycles, maintenance chemo, etc)  Goal: Absence of Hospital-Acquired Illness or Injury  Outcome: Progressing  Goal: Optimal Comfort and Wellbeing  Outcome: Progressing  Intervention: Monitor Pain and Promote Comfort  Flowsheets (Taken 05/15/2022 0229)  Pain Management Interventions:   care clustered   quiet environment facilitated   relaxation techniques promoted   pain management plan reviewed with patient/caregiver  Intervention: Provide Person-Centered Care  Flowsheets (Taken 05/15/2022 0229)  Trust Relationship/Rapport:   care explained   choices provided   emotional support provided   empathic listening provided   questions answered   questions encouraged   reassurance provided   thoughts/feelings acknowledged

## 2022-05-15 NOTE — Unmapped (Signed)
Physician Discharge Summary [facility]  4 ONC [facility]  [address]  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 *      Hospital Course:   KNIYA RIDDLEY is a 46 y.o. female whose presentation is complicated by Ph+ ALL in remission that presented to [facility] with fever and neutropenia.     Active Problems  Fever and Neutropenia  Patient reports fevers have been ongoing and working up as an outpatient but now neutropenic driving admission. Outpatient imaging on 05/11/2022 included CT chest which showed mild diffuse bronchial wall thickening, otherwise unremarkable, normal CTAP, and mild mucosal thickening of right inferior sphenoid sinus on CT sinus. No localizing symptoms on exam or history. Will culture and treat with antibiotics and clinically monitor. Blood culture NG48 hours, urine cultures with 10-50k of E faecalis, low-level CMV viremia. Treated with Cefepime for 10/12-10/14, switched to PO Levaquin without subsequent fevers, discharged on 5 day course of Levaquin to cover UTI and rhinosinusitis.     Ph+ ALL  Follows with Dr. Senaida Ores. 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 She has completed 5 cycles of hyperCVAD but had multiple hospital admissions for infections. Transitioned to maintenance therapy w/ prednisone, vincristine, and dasatinib on 05/05/2022. Dasatinib held iso fever, neutropenia. Will hold at discharge and leave restarting it to the outpatient hematology team.     LFT elevation  Mild AST/ALT elevation (44/77), stable from prior.      Cancer Related Fatigue  Patient endorses fatigue related to cancer and chemo.     Allergic Rhinitis  Continued home cetirizine and albuterol PRN.     GAD  Continued home lexapro 10mg  daily.     Pancytopenia due to chemotherapy  Daily CBC.  The patient's hospital stay has been complicated by the following clinically significant conditions requiring additional evaluation and treatment or having a significant effect of this patient's care: - Thrombocytopenia POA requiring further investigation or monitor  - Anemia POA requiring further investigation or monitoring     Nutrition Assessment:   Patient does not meet AND/ASPEN criteria for malnutrition at this time (05/13/22 1021)       Outpatient Provider Follow Up Issues:   [ ]  Restart Dasatinib pending no further fevers    Touchbase with Outpatient Provider:  Warm Handoff: Completed on 05/15/22 by Cydney Ok, MD  (Intern) via [facility] Message    Procedures:  None  ______________________________________________________________________  Discharge Medications:      Your Medication List        CHANGE how you take these medications      levoFLOXacin 750 MG tablet  Commonly known as: LEVAQUIN  Take 1 tablet (750 mg total) by mouth daily for 2 days.  Start taking on: May 16, 2022  What changed:   medication strength  how much to take  when to take this  reasons to take this            CONTINUE taking these medications      acetaminophen 650 MG CR tablet  Commonly known as: TYLENOL 8 HOUR  Take 2 tablets (1,300 mg total) by mouth every eight (8) hours as needed for pain.     albuterol 90 mcg/actuation inhaler  Commonly known as: PROVENTIL HFA;VENTOLIN HFA  Inhale 2 puffs every six (6) hours as needed for wheezing.     cetirizine 10 MG tablet  Commonly known as: ZyrTEC  Take 1 tablet (10 mg total) by mouth nightly.     dapsone 100 MG tablet  Take 1 tablet (100 mg total) by mouth daily.     escitalopram oxalate 10 MG tablet  Commonly known as: LEXAPRO  Take 1 tablet (10 mg total) by mouth daily.     fluconazole 200 MG tablet  Commonly known as: DIFLUCAN  Take 1 tablet (200 mg total) by mouth daily.     levalbuterol 0.31 mg/3 mL nebulizer solution  Commonly known as: XOPENEX  Inhale 3 mL (0.31 mg total) by nebulization every four (4) hours as needed for wheezing (cough).     oxyCODONE 5 MG immediate release tablet  Commonly known as: ROXICODONE  Take 1-2 tablets (5-10 mg total) by mouth every four (4) hours as needed for pain     SpryceL 70 MG tablet  Generic drug: dasatinib  Take 1 tablet (70 mg total) by mouth daily.     valACYclovir 500 MG tablet  Commonly known as: VALTREX  Take 1 tablet (500 mg total) by mouth daily.              Allergies:  Erythromycin, Other, Sulfa (sulfonamide antibiotics), and Azithromycin  ______________________________________________________________________  Pending Test Results:  Pending Labs       Order Current Status    Blood Culture #1 Preliminary result    Blood Culture #2 Preliminary result    Blood Culture, Adult Preliminary result    Micro Add-on Preliminary result    Urine Culture Preliminary result            Most Recent Labs:  All lab results last 24 hours -   Recent Results (from the past 24 hour(s))   Basic Metabolic Panel    Collection Time: 05/15/22 12:00 AM   Result Value Ref Range    Sodium 142 135 - 145 mmol/L    Potassium 3.7 3.4 - 4.8 mmol/L    Chloride 108 (H) 98 - 107 mmol/L    CO2 26.0 20.0 - 31.0 mmol/L    Anion Gap 8 5 - 14 mmol/L    BUN 10 9 - 23 mg/dL    Creatinine 1.61 0.96 - 0.80 mg/dL    BUN/Creatinine Ratio 17     eGFR CKD-EPI (2021) Female >90 >=60 mL/min/1.74m2    Glucose 85 70 - 179 mg/dL    Calcium 8.9 8.7 - 04.5 mg/dL   Magnesium Level    Collection Time: 05/15/22 12:00 AM   Result Value Ref Range    Magnesium 1.9 1.6 - 2.6 mg/dL   Phosphorus Level Collection Time: 05/15/22 12:00 AM   Result Value Ref Range    Phosphorus 3.1 2.4 - 5.1 mg/dL   CBC w/ Differential    Collection Time: 05/15/22 12:00 AM   Result Value Ref Range    WBC 8.6  3.6 - 11.2 10*9/L    RBC 2.11 (L) 3.95 - 5.13 10*12/L    HGB 7.7 (L) 11.3 - 14.9 g/dL    HCT 81.1 (L) 91.4 - 44.0 %    MCV 103.7 (H) 77.6 - 95.7 fL    MCH 36.5 (H) 25.9 - 32.4 pg    MCHC 35.2 32.0 - 36.0 g/dL    RDW 78.2 (H) 95.6 - 15.2 %    MPV 7.7 6.8 - 10.7 fL    Platelet 129 (L) 150 - 450 10*9/L    Neutrophils % 61.2 %    Lymphocytes % 19.7 %    Monocytes % 16.0 %    Eosinophils % 2.2 %    Basophils % 0.9 %    Absolute Neutrophils 5.3 1.8 - 7.8 10*9/L    Absolute Lymphocytes 1.7 1.1 - 3.6 10*9/L    Absolute Monocytes 1.4 (H) 0.3 - 0.8 10*9/L    Absolute Eosinophils 0.2 0.0 - 0.5 10*9/L    Absolute Basophils 0.1 0.0 - 0.1 10*9/L    Anisocytosis Moderate (A) Not Present   Morphology Review    Collection Time: 05/15/22 12:00 AM   Result Value Ref Range    Smear Review Comments See Comment (A) Undefined    Neutrophil Left Shift Present (A) Not Present   Prepare RBC    Collection Time: 05/15/22 12:41 PM   Result Value Ref Range    Crossmatch Compatible     Unit Blood Type O Pos     ISBT Number 5100     Unit # [id number]     Status Issued     Spec Expiration [id number]     Product ID Red Blood Cells     PRODUCT CODE [id number]        Relevant Studies/Radiology:  ECG 12 Lead    Result Date: 05/13/2022  NORMAL SINUS RHYTHM LOW VOLTAGE QRS WHEN COMPARED WITH ECG OF 12-May-2022 16:43, NO SIGNIFICANT CHANGE WAS FOUND Confirmed by Aundra Dubin ([id number]) on 05/13/2022 9:55:32 AM    ECG 12 Lead    Result Date: 05/12/2022  SINUS TACHYCARDIA LOW VOLTAGE QRS PROLONGED QT WHEN COMPARED WITH ECG OF 22-Mar-2022 10:30, NO SIGNIFICANT CHANGE WAS FOUND Confirmed by Aundra Dubin ([id number]) on 05/12/2022 8:39:33 PM    XR Chest Portable    Result Date: 05/12/2022  EXAM: XR CHEST PORTABLE DATE: 05/12/2022 6:35 PM ACCESSION: [id number] UN DICTATED: 05/12/2022 6:38 PM INTERPRETATION LOCATION: MAIN CAMPUS CLINICAL INDICATION: 46 years old Female with FEVER  TECHNIQUE: Single frontal view of the chest. COMPARISON: 03/02/2022 chest radiograph FINDINGS: Lung: The lungs appear clear. Pleura: No pleural effusion or pneumothorax identified. Mediastinum: The cardiomediastinal silhouette is within normal limits for technique. Bones: No evidence of acute osseous abnormality is identified. Other: Accessed right IJ approach Port-A-Cath with tip overlying the superior cavoatrial junction.      No radiographic evidence of acute cardiopulmonary pathology.    ______________________________________________________________________  Discharge Instructions:   Activity Instructions       Activity as tolerated                       Follow Up instructions and Outpatient Referrals     Call MD for:  difficulty breathing, headache or visual disturbances      Call MD for:  persistent nausea or vomiting      Call MD for:  severe uncontrolled pain      Call MD for:  temperature >38.5 Celsius      Discharge  instructions          Appointments which have been scheduled for you      May 18, 2022  8:30 AM  (Arrive by 8:00 AM)  NURSE LAB DRAW with ADULT ONC LAB  [facility] ADULT ONCOLOGY LAB DRAW STATION [place] [facility] REGION) [address]  [place] [address]  [phone]        May 18, 2022  9:00 AM  (Arrive by 8:30 AM)  RETURN ACTIVE [place] with [facility] LEUKEMIA PHARMACIST  [place] HEMATOLOGY ONCOLOGY 2ND FLR CANCER HOSP [facility] REGION) [address]  [place] [address]  [phone]        May 18, 2022 10:00 AM  (Arrive by 9:30 AM)  LEVEL 150 with [employer] CHAIR 02  [place] ONCOLOGY INFUSION [facility] [facility] REGION) [address]  [place] [address]  [phone]        May 18, 2022  1:30 PM  (Arrive by 1:00 PM)  FL LUMBAR PUNCTURE WITH CHEMO with Onnie Boer RM 8  [facility]) [address]  [place] [address]  [phone]        Jun 14, 2022  8:30 AM  (Arrive by 8:00 AM)  RETURN VIDEO MYCHART with Ortencia Kick, MD  [facility] ONCOLOGY MULTIDISCIPLINARY 2ND FLR CANCER HOSP [facility] REGION) [address]  [place] [address]  [phone]   Please sign into My [place] Chart at least 15 minutes before your appointment to complete the eCheck-In process. You must complete eCheck-In before you can start your video visit. We also recommend testing your audio and video connection to troubleshoot any issues before your visit begins. Click ???Join Video Visit??? to complete these checks. Once you have completed eCheck-In and tested your audio and video, click ???Join Call??? to connect to your visit.     For your video visit, you will need a computer with a working camera, speaker and microphone, a smartphone, or a tablet with internet access.    My [place] Chart enables you to manage your health, send non-urgent messages to your provider, view your test results, schedule and manage appointments, and request prescription refills securely and conveniently from your computer or mobile device.    You can go to [email or web address] to sign in to your My [place] Chart account with your username and password. If you have forgotten your username or password, please choose the ???Forgot Username???? and/or ???Forgot Password???? links to gain access. You also can access your My [place] Chart account with the free MyChart mobile app for Android or iPhone.    If you need assistance accessing your My [place] Chart account or for assistance in reaching your provider's office to reschedule or cancel your appointment, please call [facility] [phone].         Jun 30, 2022  1:55 PM  (Arrive by 1:30 PM)  RETURN NEUROLOGY with Idelia Salm, MD  [facility] NEUROLOGY CLINIC [address] [place] [facility] REGION) [address]  [place] [address]  [phone]             ______________________________________________________________________  Discharge Day Services:  BP 139/92  - Pulse 125  - Temp 37 ??C (98.6 ??F) (Oral)  - Resp 16  - Ht 167.6 cm (5' 6)  - Wt 79.4 kg (175 lb)  - SpO2 94%  - BMI  28.25 kg/m??     Pt seen on the day of discharge and determined appropriate for discharge.    Condition at Discharge: stable    Length of Discharge: I spent greater than 30 mins in the discharge of this patient.

## 2022-05-15 NOTE — Unmapped (Signed)
Discharge instructions reviewed. Discharging home with husband.   Problem: Adult Inpatient Plan of Care  Goal: Plan of Care Review  Outcome: Resolved  Goal: Patient-Specific Goal (Individualized)  Outcome: Resolved  Goal: Absence of Hospital-Acquired Illness or Injury  Outcome: Resolved  Goal: Optimal Comfort and Wellbeing  Outcome: Resolved  Goal: Readiness for Transition of Care  Outcome: Resolved  Goal: Rounds/Family Conference  Outcome: Resolved     Problem: Behavioral Health Comorbidity  Goal: Maintenance of Behavioral Health Symptom Control  Outcome: Resolved

## 2022-05-16 ENCOUNTER — Telehealth: Payer: Self-pay

## 2022-05-16 MED ORDER — LEVOFLOXACIN 750 MG TABLET
ORAL_TABLET | ORAL | 0 refills | 2 days | Status: CP
Start: 2022-05-16 — End: 2022-05-18

## 2022-05-16 NOTE — Telephone Encounter (Signed)
Transition Care Management Follow-up Telephone Call Date of discharge and from where: 05/15/22, [facility] How have you been since you were released from the hospital? "Good." Any questions or concerns? No  Items Reviewed: Did the pt receive and understand the discharge instructions provided? Yes  Medications obtained and verified? Yes  Other? No  Any new allergies since your discharge? No  Dietary orders reviewed? Yes Do you have support at home? Yes   Home Care and Equipment/Supplies: Were home health services ordered? no If so, what is the name of the agency? N/A  Has the agency set up a time to come to the patient's home? not applicable Were any new equipment or medical supplies ordered?  No What is the name of the medical supply agency? N/A Were you able to get the supplies/equipment? not applicable Do you have any questions related to the use of the equipment or supplies? No  Functional Questionnaire: (I = Independent and D = Dependent) ADLs: I  Bathing/Dressing- I  Meal Prep- I  Eating- I  Maintaining continence- I  Transferring/Ambulation- I  Managing Meds- I  Follow up appointments reviewed:  PCP Hospital f/u appt confirmed? No . Patient requested to wait to schedule follow up due her immune system and cancer diagnosis.  [facility] f/u appt confirmed? Yes  Scheduled to see Oncology on 05/18/22 @ 8:30 AM. Are transportation arrangements needed? No  If their condition worsens, is the pt aware to call PCP or go to the Emergency Dept.? Yes Was the patient provided with contact information for the PCP's office or ED? Yes Was to pt encouraged to call back with questions or concerns? Yes

## 2022-05-17 DIAGNOSIS — C9100 Acute lymphoblastic leukemia not having achieved remission: Principal | ICD-10-CM

## 2022-05-18 ENCOUNTER — Ambulatory Visit: Admit: 2022-05-18 | Discharge: 2022-05-18 | Payer: PRIVATE HEALTH INSURANCE

## 2022-05-18 ENCOUNTER — Encounter: Admit: 2022-05-18 | Discharge: 2022-05-18 | Payer: PRIVATE HEALTH INSURANCE

## 2022-05-18 ENCOUNTER — Other Ambulatory Visit: Admit: 2022-05-18 | Discharge: 2022-05-18 | Payer: PRIVATE HEALTH INSURANCE

## 2022-05-18 DIAGNOSIS — C9100 Acute lymphoblastic leukemia not having achieved remission: Principal | ICD-10-CM

## 2022-05-18 LAB — COMPREHENSIVE METABOLIC PANEL
ALBUMIN: 3.6 g/dL (ref 3.4–5.0)
ALKALINE PHOSPHATASE: 128 U/L — ABNORMAL HIGH (ref 46–116)
ALT (SGPT): 51 U/L — ABNORMAL HIGH (ref 10–49)
ANION GAP: 9 mmol/L (ref 5–14)
AST (SGOT): 37 U/L — ABNORMAL HIGH (ref <=34)
BILIRUBIN TOTAL: 0.6 mg/dL (ref 0.3–1.2)
BLOOD UREA NITROGEN: 13 mg/dL (ref 9–23)
BUN / CREAT RATIO: 21
CALCIUM: 8.8 mg/dL (ref 8.7–10.4)
CHLORIDE: 110 mmol/L — ABNORMAL HIGH (ref 98–107)
CO2: 27 mmol/L (ref 20.0–31.0)
CREATININE: 0.62 mg/dL
EGFR CKD-EPI (2021) FEMALE: >90 mL/min/{1.73_m2} (ref >=60)
GLUCOSE RANDOM: 103 mg/dL (ref 70–179)
POTASSIUM: 3.8 mmol/L (ref 3.4–4.8)
PROTEIN TOTAL: 5.7 g/dL (ref 5.7–8.2)
SODIUM: 146 mmol/L — ABNORMAL HIGH (ref 135–145)

## 2022-05-18 LAB — CBC W/ AUTO DIFF
BASOPHILS ABSOLUTE COUNT: 0 10*9/L (ref 0.0–0.1)
BASOPHILS RELATIVE PERCENT: 0.2 %
EOSINOPHILS ABSOLUTE COUNT: 0.1 10*9/L (ref 0.0–0.5)
EOSINOPHILS RELATIVE PERCENT: 0.9 %
HEMATOCRIT: 28.5 % — ABNORMAL LOW (ref 34.0–44.0)
HEMOGLOBIN: 10 g/dL — ABNORMAL LOW (ref 11.3–14.9)
LYMPHOCYTES ABSOLUTE COUNT: 2.1 10*9/L (ref 1.1–3.6)
LYMPHOCYTES RELATIVE PERCENT: 21.3 %
MEAN CORPUSCULAR HEMOGLOBIN CONC: 35 g/dL (ref 32.0–36.0)
MEAN CORPUSCULAR HEMOGLOBIN: 34.9 pg — ABNORMAL HIGH (ref 25.9–32.4)
MEAN CORPUSCULAR VOLUME: 99.9 fL — ABNORMAL HIGH (ref 77.6–95.7)
MEAN PLATELET VOLUME: 7.7 fL (ref 6.8–10.7)
MONOCYTES ABSOLUTE COUNT: 1.3 10*9/L — ABNORMAL HIGH (ref 0.3–0.8)
MONOCYTES RELATIVE PERCENT: 13.4 %
NEUTROPHILS ABSOLUTE COUNT: 6.3 10*9/L (ref 1.8–7.8)
NEUTROPHILS RELATIVE PERCENT: 64.2 %
PLATELET COUNT: 130 10*9/L — ABNORMAL LOW (ref 150–450)
RED BLOOD CELL COUNT: 2.85 10*12/L — ABNORMAL LOW (ref 3.95–5.13)
RED CELL DISTRIBUTION WIDTH: 21.6 % — ABNORMAL HIGH (ref 12.2–15.2)
WBC ADJUSTED: 9.7 10*9/L (ref 3.6–11.2)

## 2022-05-18 LAB — LACTATE DEHYDROGENASE: LACTATE DEHYDROGENASE: 573 U/L — ABNORMAL HIGH (ref 120–246)

## 2022-05-18 LAB — HEPATITIS B CORE ANTIBODY, TOTAL: HEPATITIS B CORE TOTAL ANTIBODY: NONREACTIVE

## 2022-05-18 LAB — HEPATITIS B SURFACE ANTIBODY
HEPATITIS B SURFACE ANTIBODY QUANT: 101.02 m[IU]/mL — ABNORMAL HIGH (ref <8.00)
HEPATITIS B SURFACE ANTIBODY: REACTIVE — AB

## 2022-05-18 LAB — HEPATITIS B SURFACE ANTIGEN: HEPATITIS B SURFACE ANTIGEN: NONREACTIVE

## 2022-05-18 NOTE — Unmapped (Signed)
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 female with Ph+ B-ALL who I am seeing in clinic today for post-hospital discharge transitions of care visit    Encounter Date: 05/18/2022    Current Treatment: s/p 5 cycles of hyperCVAD + rituximab + dasatinib; currently on dasatinib 70 mg daily (which is on HOLD), and pending plans for start of maintenance.     For oral chemotherapy:  Pharmacy: [facility]   Medication Access: $0 copay with insurance    Interval History: I saw Ms Ryks in clinic today after recent admission for FN. She reports she feels well today, actually feeling much better than she has in a long time and energy feels great. She is holding dasatinib since10/11 and since discharge as instructed. She is completing last day of treatment levaquin today. She reports she has not had any fevers since she was in the hospital, with the last one being on 10/12. She does APAP occasionally but always check temperature first. Her fevers originally started in September, on an off and usually low grade, varying from ~98-99 to 100-101. They improved over time but still continued until recently. The only symptoms she noted during this entire time is post nasal drip and some mild cough secondary to post nasal drip, which have improved substantially since receiving antibiotics. No other diarrhea, urinary symptoms, skin changes. Currently energy and appetite are good.     Labs: WBC 9.7, ANC 6.3, Hgb 10.0, PLT 130; CMP with AST 37, ALT 51 (stable, grade 1). CMV viral load 318 on 10/13, pending today.   Vitals: BP 109/87; HR 90    Oncologic History:  Oncology History Overview Note   Referring/Local Oncologist:    Diagnosis:   10/29/2021  Bone marrow, left iliac, aspiration and biopsy  -  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 The majority of the abnormal cells (64/88) contained an additional BCR/ABL1 fusion signal, while 9/88 abnormal cells contained an additional ABL1 and ASS1 signal.       Genetics:   Karyotype/FISH:   RESULTS   Date Value Ref Range Status   10/28/2021   Final    NOTE: This report reflects a combined study from a peripheral blood and a bone marrow core biopsy. Eleven cells from the peripheral blood and nine cells from the bone marrow core biopsy were analyzed. The BCR/ABL1 FISH analysis was performed on the peripheral blood.     Abnormal Karyotype:  47,XX,t(9;22)(q34;q11.2),+der(22)t(9;22)[18]/46,XX[2]    Abnormal FISH:  A BCR/ABL1 interphase FISH assay showed a signal pattern consistent with a BCR::ABL1 rearrangement and the 9;22 translocation in 88% of the 100 cells scored. The majority of the abnormal cells (64/88) contained an additional BCR/ABL1 fusion signal, while 9/88 abnormal cells contained an additional ABL1 and ASS1 signal.           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  MRD by  flow insufficient, p190 2/100,000  12/10/2021: Cycle 2 B cycle + rituximab  12/14/21: IT#4  12/17/21: IT#5  01/10/22: Cycle 3 A cycle + rituximab  01/24/22: Bmbx - MRD-neg by PCR (p190)   02/17/22: C4 B cycle + ritux  02/18/22: IT#6  03/21/22: C5 A cycle + ritux (4th dose)  03/22/22: IT#7  04/29/22: Bmbx - MRD-neg by PCR (p190)   05/09/22: Start maintenance        B-cell acute lymphoblastic leukemia (ALL) (CMS-HCC)   10/29/2021 Initial Diagnosis    B-cell acute lymphoblastic leukemia (ALL) (CMS-HCC) 10/30/2021 - 03/30/2022 Chemotherapy    IP/OP LEUKEMIA GRAAPH-2005 < 60 YO (OP PEGFILGRASTIM ON DAY 7)  [No description for this plan]     01/06/2022 Endocrine/Hormone Therapy    OP LEUPROLIDE (LUPRON) 11.25 MG EVERY 3 MONTHS  Plan Provider: [name], MD     05/26/2022 -  Chemotherapy    OP LEUKEMIA VINCRISTINE  vinCRIStine 2 mg IV on day 1         Weight and Vitals:  Wt Readings from Last 3 Encounters:   05/13/22 79.4 kg (175 lb)   05/05/22 81.8 kg (180 lb 5.4 oz)   04/20/22 80 kg (176 lb 5.9 oz)     Temp Readings from Last 3 Encounters:   05/15/22 36.8 ??C (98.2 ??F) (Oral)   05/05/22 37.1 ??C (98.7 ??F) (Temporal)   04/28/22 37.2 ??C (99 ??F) (Oral)     BP Readings from Last 3 Encounters:   05/15/22 118/87   05/05/22 117/81   04/28/22 118/79     Pulse Readings from Last 3 Encounters:   05/15/22 95   05/05/22 108   04/28/22 91       Pertinent Labs:  Lab on 05/18/2022   Component Date Value Ref Range Status    WBC 05/18/2022 9.7  3.6 - 11.2 10*9/L Final    RBC 05/18/2022 2.85 (L)  3.95 - 5.13 10*12/L Final    HGB 05/18/2022 10.0 (L)  11.3 - 14.9 g/dL Final    HCT 16/05/9603 28.5 (L)  34.0 - 44.0 % Final    MCV 05/18/2022 99.9 (H)  77.6 - 95.7 fL Final    MCH 05/18/2022 34.9 (H)  25.9 - 32.4 pg Final    MCHC 05/18/2022 35.0  32.0 - 36.0 g/dL Final    RDW 54/04/8118 21.6 (H)  12.2 - 15.2 % Final    MPV 05/18/2022 7.7  6.8 - 10.7 fL Final    Platelet 05/18/2022 130 (L)  150 - 450 10*9/L Final    Neutrophils % 05/18/2022 64.2  % Final    Lymphocytes % 05/18/2022 21.3  % Final    Monocytes % 05/18/2022 13.4  % Final    Eosinophils % 05/18/2022 0.9  % Final    Basophils % 05/18/2022 0.2  % Final    Absolute Neutrophils 05/18/2022 6.3  1.8 - 7.8 10*9/L Final    Absolute Lymphocytes 05/18/2022 2.1  1.1 - 3.6 10*9/L Final    Absolute Monocytes 05/18/2022 1.3 (H)  0.3 - 0.8 10*9/L Final    Absolute Eosinophils 05/18/2022 0.1  0.0 - 0.5 10*9/L Final    Absolute Basophils 05/18/2022 0.0  0.0 - 0.1 10*9/L Final Anisocytosis 05/18/2022 Moderate (A)  Not Present Final       Allergies:   Allergies   Allergen Reactions    Erythromycin Hives     Other reaction(s): Not available    Other      Pt cant take ibuprofen  due to condition.    Sulfa (Sulfonamide Antibiotics) Anaphylaxis     Other reaction(s): Not available    Azithromycin        Drug Interactions: none identified; dasatinib is a major 3A4 substrate, caution with 3A4 inh/ind; fluconazole max dose with VCR is 200 mg daily; avoid acid reducers with  dasatinib.       Current Medications:  Current Outpatient Medications   Medication Sig Dispense Refill    acetaminophen (TYLENOL 8 HOUR) 650 MG CR tablet Take 2 tablets (1,300 mg total) by mouth every eight (8) hours as needed for pain.      albuterol HFA 90 mcg/actuation inhaler Inhale 2 puffs every six (6) hours as needed for wheezing.      cetirizine (ZYRTEC) 10 MG tablet Take 1 tablet (10 mg total) by mouth nightly. 30 tablet 2    dapsone 100 MG tablet Take 1 tablet (100 mg total) by mouth daily. 30 tablet 2    dasatinib (SPRYCEL) 70 MG tablet Take 1 tablet (70 mg total) by mouth daily. 30 tablet 7    escitalopram oxalate (LEXAPRO) 10 MG tablet Take 1 tablet (10 mg total) by mouth daily.      fluconazole (DIFLUCAN) 200 MG tablet Take 1 tablet (200 mg total) by mouth daily.      levalbuterol (XOPENEX) 0.31 mg/3 mL nebulizer solution Inhale 3 mL (0.31 mg total) by nebulization every four (4) hours as needed for wheezing (cough). 45 mL 0    levoFLOXacin (LEVAQUIN) 750 MG tablet Take 1 tablet (750 mg total) by mouth daily for 2 days. 2 tablet 0    oxyCODONE (ROXICODONE) 5 MG immediate release tablet Take 1-2 tablets (5-10 mg total) by mouth every four (4) hours as needed for pain      valACYclovir (VALTREX) 500 MG tablet Take 1 tablet (500 mg total) by mouth daily. 90 tablet 3     No current facility-administered medications for this visit.       Adherence: no barriers identified        Assessment: Ms.Shiner is a 46 y.o. female with Ph+ B-ALL s/p 5 cycles hyperCVAD + rituximab + dasatinib. Recently admitted for neutropenic fever. Infectious work-up notable for UTI/potential rhinosinusitis and CMV+. Will complete treatment course of levofloxacin today and ICID appt next week regarding CMV. Dasatinib has been on hold. ANC recovered today (last dose of growth factor was 10/14) and remains afebrile since hospitalization. Last marrow since completion of 5 cycles hyperCVAD with MRD-negative remission. We will tentatively plan to start maintenance next week.     Plan:   1) Ph+ ALL  -RESTART dasatinib 70 mg daily  -Will tentatively plan to start maintenance (VCR/dasatinib/prednisone) on 05/26/22  -Has completed 7 of 12 IT therapies, plan for next one during Cycle 1 (to be scheduled)    2) UTI/rhinosinusitis  -Complete levofloxacin 750 mg daily through 10/18    3) CMV  -Seeing ID on 10/25 for recommendations  -CMV viral load on 318 on 10/13, pending from today    4) Infection prophylaxis  -Resume levofloxacin/fluc 200 mg if ANC<0.5  -Continue dapsone 100 mg PO daily  -Continue valacyclovir 500 mg PO daily    F/u:  Future Appointments   Date Time Provider Department Center   05/18/2022 10:00 AM ONCINF CHAIR 04 [id number]   05/25/2022 10:00 AM Bill Salinas, MD [id number]   05/26/2022 12:00 PM ADULT ONC LAB [facility]   05/26/2022  1:00 PM Senaida Ores,  Bevelyn Buckles, MD [facility]   06/14/2022  8:30 AM Ortencia Kick, MD [facility]   06/30/2022  1:55 PM Idelia Salm, MD [facility]       I spent 30 minutes with Ms.Schedler in direct patient care.     Ronnald Collum, PharmD, BCOP, CPP  Hematology/Oncology Clinical Pharmacist  Pager [phone]

## 2022-05-18 NOTE — Unmapped (Addendum)
It was great to see you today! Glad you are feeling better.   You can restart your dasatinib 70 mg (1 tablet) by mouth once daily.   We will have you see ID doc next week on Wednesday, and back to see [place] on Thursday

## 2022-05-20 DIAGNOSIS — B259 Cytomegaloviral disease, unspecified: Principal | ICD-10-CM

## 2022-05-20 DIAGNOSIS — C9100 Acute lymphoblastic leukemia not having achieved remission: Principal | ICD-10-CM

## 2022-05-20 LAB — CMV DNA, QUANTITATIVE, PCR: CMV VIRAL LD: NOT DETECTED

## 2022-05-24 DIAGNOSIS — C9100 Acute lymphoblastic leukemia not having achieved remission: Principal | ICD-10-CM

## 2022-05-24 NOTE — Unmapped (Signed)
IMMUNOCOMPROMISED HOST INFECTIOUS DISEASE CONSULT NOTE      Patricia [name] is being seen in consultation at the request of [name] for evaluation of CMV viremia    Assessment/Recommendations:    Patricia [name] is a 46 y.o. 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  NA    Prophylaxis  Valtrex and Dapsone    Follow up PRN          Recommendations were communicated via shared medical record.    History of Present Illness:      Source of information includes:  Electronic Medical Records and Discussion with patient    I reviewed and summarized the medical records from hospitalization 10/12-10/14 which illustrated bacteriuria and fevers of unknown origin    From 10/12-10/14 She had been hospitalized for FUO, with CT sinus/chest/A/P relatively unremarkable but with minor sinusitis, bronchial thickening. Also urine culture + E.faecalis although she had been asymptomatic at that time. She was sent home on Levo x 5 days and reports that since then, she has not had any fevers. She was found, however, to have had low level CMV viremia which subsequently went away without treatment. She denies any current symptoms including no fevers or chills, no cough or SOB, no rashes, no n/v/diarrhea. Her strength is returning. She has the usual back and shoulder pain which she reports is long standing and muscular in origin. She is walking more than before and trying to improve her strength overall. She had broken 2 toes in L foot in January which she says are slowly healing. Otherwise she is doing well. No fevers other than mild temp increase last night to 27F. She had questions about CMV that we discussed (she would not be newly contagious to partner). She will be started on maintenance chemo shortly.       Allergies:  Allergies   Allergen Reactions    Erythromycin Hives     Other reaction(s): Not available    Other      Pt cant take ibuprofen due to condition.    Sulfa (Sulfonamide Antibiotics) Anaphylaxis     Other reaction(s): Not available    Azithromycin        Medications:     Current Outpatient Medications:     acetaminophen (TYLENOL 8 HOUR) 650 MG CR tablet, Take 2 tablets (1,300 mg total) by mouth every eight (8) hours as needed for pain., Disp: , Rfl:     albuterol HFA  90 mcg/actuation inhaler, Inhale 2 puffs every six (6) hours as needed for wheezing., Disp: , Rfl:     cetirizine (ZYRTEC) 10 MG tablet, Take 1 tablet (10 mg total) by mouth nightly., Disp: 30 tablet, Rfl: 2    dapsone 100 MG tablet, Take 1 tablet (100 mg total) by mouth daily., Disp: 30 tablet, Rfl: 2    dasatinib (SPRYCEL) 70 MG tablet, Take 1 tablet (70 mg total) by mouth daily., Disp: 30 tablet, Rfl: 7    escitalopram oxalate (LEXAPRO) 10 MG tablet, Take 1 tablet (10 mg total) by mouth daily., Disp: , Rfl:     fluconazole (DIFLUCAN) 200 MG tablet, Take 1 tablet (200 mg total) by mouth daily., Disp: , Rfl:     levalbuterol (XOPENEX) 0.31 mg/3 mL nebulizer solution, Inhale 3 mL (0.31 mg total) by nebulization every four (4) hours as needed for wheezing (cough)., Disp: 45 mL, Rfl: 0    oxyCODONE (ROXICODONE) 5 MG immediate release tablet, Take 1-2 tablets (5-10 mg total) by mouth every four (4) hours as needed for pain, Disp: , Rfl:     valACYclovir (VALTREX) 500 MG tablet, Take 1 tablet (500 mg total) by mouth daily., Disp: 90 tablet, Rfl: 3    hydrocortisone 2.5 % cream, Apply topically two (2) times a day. Apply sparingly twice a day., Disp: 30 g, Rfl: 0    predniSONE (DELTASONE) 50 MG tablet, Take 4 tablets (200 mg total) by mouth once daily on days 1-5 of each cycle., Disp: 20 tablet, Rfl: 5    Current antibiotics:  Valtrex and Dapsone ppx    Previous antibiotics:  Levo, Cefepime  Flucon while neutropenic    Current/Prior immunomodulators:  None    Other medications reviewed.     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 Her smoking use included cigarettes. She has never used smokeless tobacco.   Alcohol use:    reports that she does not currently use alcohol.   Drug use:    reports no history of drug use.   Living situation:  Lives with spouse/partner   [place]     Family History:  No family history on file.    Review of Systems:  All other systems reviewed are negative.     Objective     Vital Signs:  There were no vitals taken for this visit.    Physical Exam:     Const [x]  vital signs above    [x]  NAD, non-toxic appearance []  Chronically ill-appearing, non-distressed        Eyes [x]  Lids normal bilaterally, conjunctiva anicteric and noninjected OU     [] PERRL  [] EOMI        ENMT [x]  Normal appearance of external nose and ears, no nasal discharge        []  MMM, no lesions on lips or gums []  No thrush, leukoplakia, oral lesions  []  Dentition good []  Edentulous []  Dental caries  present  []  Hearing normal  []  TMs with good light reflexes bilaterally         Neck []  Neck of normal appearance and trachea midline        []  No thyromegaly, nodules, or tenderness   []  Full neck ROM        Lymph []  No LAD in neck     []  No LAD in supraclavicular area     []  No LAD in axillae   []  No LAD in epitrochlear chains     []  No LAD in inguinal areas        CV []  RRR            []  No peripheral edema     []  Pedal pulses intact   []  No abnormal heart sounds appreciated   []  Extremities WWP         Resp []  Normal WOB at rest    []  No breathlessness with speaking, no coughing  []  CTA anteriorly    []  CTA posteriorly          GI []  Normal inspection, NTND   []  NABS     []  No umbilical hernia on exam       []  No hepatosplenomegaly     []  Inspection of perineal and perianal areas normal        GU []  Normal external genitalia     [] No urinary catheter present in urethra   []  No CVA tenderness    []  No tenderness over renal allograft        MSK []  No clubbing or cyanosis of hands       []  No vertebral point tenderness  []  No focal tenderness or abnormalities on palpation of joints in RUE, LUE, RLE, or LLE        Skin []  No rashes, lesions, or ulcers of visualized skin     []  Skin warm and dry to palpation         Neuro [x]  Face expression symmetric  []  Sensation to light touch grossly intact throughout    []  Moves extremities equally    [x]  No tremor noted        []  CNs II-XII grossly intact     []  DTRs normal and symmetric throughout []  Gait unremarkable        Psych [x]  Appropriate affect       [x]  Fluent speech         [x]  Attentive, good eye contact  []  Oriented to person, place, time          [x]  Judgment and insight are appropriate             Labs:  Results in Past 30 Days  Result Component Current Result Ref Range Previous Result Ref Range   Absolute Eosinophils 0.0 (05/26/2022) 0.0 - 0.5 10*9/L 0.1 (05/18/2022) 0.0 - 0.5 10*9/L   Absolute Lymphocytes 1.0 (L) (05/26/2022) 1.1 - 3.6 10*9/L 2.1 (05/18/2022) 1.1 - 3.6 10*9/L   Absolute Neutrophils 1.3 (L) (05/26/2022) 1.8 - 7.8 10*9/L 6.3 (05/18/2022) 1.8 - 7.8 10*9/L   Alkaline Phosphatase 79 (05/26/2022) 46 - 116 U/L 128 (H) (05/18/2022) 46 - 116 U/L   ALT 81 (H) (05/26/2022) 10 - 49 U/L 51 (H) (05/18/2022) 10 - 49 U/L   AST 54 (H) (05/26/2022) <=34 U/L 37 (H) (05/18/2022) <=34 U/L   BUN 17 (05/26/2022) 9 - 23 mg/dL 13 (13/03/6577) 9 - 23 mg/dL   Calcium 8.7 (46/96/2952) 8.7 -  10.4 mg/dL 8.8 (16/05/9603) 8.7 - 10.4 mg/dL   Creatinine 5.40 (98/06/9146) 0.55 - 1.02 mg/dL 8.29 (56/21/3086) 5.78 - 0.80 mg/dL   HGB 46.9 (L) (62/95/2841) 11.3 - 14.9 g/dL 32.4 (L) (40/05/2724) 36.6 - 14.9 g/dL   Magnesium 1.9 (44/09/4740) 1.6 - 2.6 mg/dL 2.0 (59/56/3875) 1.6 - 2.6 mg/dL   Phosphorus 3.1 (64/33/2951) 2.4 - 5.1 mg/dL 3.9 (88/41/6606) 2.4 - 5.1 mg/dL   Platelet 301 (L) (60/05/9322) 150 - 450 10*9/L 130 (L) (05/18/2022) 150 - 450 10*9/L   Potassium 3.8 (05/26/2022) 3.4 - 4.8 mmol/L 3.8 (05/18/2022) 3.4 - 4.8 mmol/L   Total Bilirubin 0.7 (05/26/2022) 0.3 - 1.2 mg/dL 0.6 (55/73/2202) 0.3 - 1.2 mg/dL   WBC 2.7 (L) (54/27/0623) 3.6 - 11.2 10*9/L 9.7 (05/18/2022) 3.6 - 11.2 10*9/L       Microbiology:  Past cultures were reviewed in Epic and CareEverywhere.    Imaging:  CT chest 05/11/22 Bronchial wall thickening, otherwise normal  Independent visualization of images: I independently reviewed the image from 05/11/22 and I agree with the findings/interpretation.    Serologies:  Lab Results   Component Value Date    Hep B Surface Ag Nonreactive 05/18/2022    Hep B S Ab Reactive (A) 05/18/2022    Hep B Surf Ab Quant 101.02 (H) 05/18/2022    Hep B Core Total Ab Nonreactive 05/18/2022    Hepatitis C Ab Nonreactive 10/28/2021       Immunizations:  Immunization History   Administered Date(s) Administered    COVID-19 VACC,MRNA,(PFIZER)(PF) 02/20/2020, 03/12/2020       Time spent on counseling/coordination of care: 15 Minutes  Total time spent with patient: 30 Minutes      I was in the hospital.  I spent 30 minutes on the real-time audio and video with the patient for a a consultation. Total time was 30 minutes and over 50% of the service was counseling and/or coordination of care within the patient unit.      The patient and/or parent/guardian has been advised of the potential risks and limitations of this mode of treatment (including, but not limited to, the absence of in-person examination) and has agreed to be treated using telemedicine. The  Patient's, patient's guardian's and/or patient's family's questions regarding telemedicine have been answered.

## 2022-05-25 ENCOUNTER — Telehealth
Admit: 2022-05-25 | Discharge: 2022-05-26 | Payer: PRIVATE HEALTH INSURANCE | Attending: Infectious Disease | Primary: Infectious Disease

## 2022-05-25 NOTE — Unmapped (Signed)
Pt confirmed MyChart visit and meds

## 2022-05-26 ENCOUNTER — Other Ambulatory Visit: Admit: 2022-05-26 | Discharge: 2022-05-27 | Payer: PRIVATE HEALTH INSURANCE

## 2022-05-26 ENCOUNTER — Ambulatory Visit
Admit: 2022-05-26 | Discharge: 2022-05-27 | Payer: PRIVATE HEALTH INSURANCE | Attending: Adult Health | Primary: Adult Health

## 2022-05-26 ENCOUNTER — Ambulatory Visit: Admit: 2022-05-26 | Discharge: 2022-05-27 | Payer: PRIVATE HEALTH INSURANCE

## 2022-05-26 ENCOUNTER — Encounter: Admit: 2022-05-26 | Discharge: 2022-05-27 | Payer: PRIVATE HEALTH INSURANCE

## 2022-05-26 DIAGNOSIS — B259 Cytomegaloviral disease, unspecified: Principal | ICD-10-CM

## 2022-05-26 DIAGNOSIS — C9100 Acute lymphoblastic leukemia not having achieved remission: Principal | ICD-10-CM

## 2022-05-26 LAB — COMPREHENSIVE METABOLIC PANEL
ALBUMIN: 3.9 g/dL (ref 3.4–5.0)
ALKALINE PHOSPHATASE: 79 U/L (ref 46–116)
ALT (SGPT): 81 U/L — ABNORMAL HIGH (ref 10–49)
ANION GAP: 8 mmol/L (ref 5–14)
AST (SGOT): 54 U/L — ABNORMAL HIGH (ref <=34)
BILIRUBIN TOTAL: 0.7 mg/dL (ref 0.3–1.2)
BLOOD UREA NITROGEN: 17 mg/dL (ref 9–23)
BUN / CREAT RATIO: 26
CALCIUM: 8.7 mg/dL (ref 8.7–10.4)
CHLORIDE: 106 mmol/L (ref 98–107)
CO2: 26 mmol/L (ref 20.0–31.0)
CREATININE: 0.66 mg/dL
EGFR CKD-EPI (2021) FEMALE: >90 mL/min/{1.73_m2} (ref >=60)
GLUCOSE RANDOM: 182 mg/dL — ABNORMAL HIGH (ref 70–179)
POTASSIUM: 3.8 mmol/L (ref 3.4–4.8)
PROTEIN TOTAL: 5.9 g/dL (ref 5.7–8.2)
SODIUM: 140 mmol/L (ref 135–145)

## 2022-05-26 LAB — CBC W/ AUTO DIFF
BASOPHILS ABSOLUTE COUNT: 0 10*9/L (ref 0.0–0.1)
BASOPHILS RELATIVE PERCENT: 0.5 %
EOSINOPHILS ABSOLUTE COUNT: 0 10*9/L (ref 0.0–0.5)
EOSINOPHILS RELATIVE PERCENT: 0.5 %
HEMATOCRIT: 28.5 % — ABNORMAL LOW (ref 34.0–44.0)
HEMOGLOBIN: 10 g/dL — ABNORMAL LOW (ref 11.3–14.9)
LYMPHOCYTES ABSOLUTE COUNT: 1 10*9/L — ABNORMAL LOW (ref 1.1–3.6)
LYMPHOCYTES RELATIVE PERCENT: 38.4 %
MEAN CORPUSCULAR HEMOGLOBIN CONC: 35 g/dL (ref 32.0–36.0)
MEAN CORPUSCULAR HEMOGLOBIN: 36.1 pg — ABNORMAL HIGH (ref 25.9–32.4)
MEAN CORPUSCULAR VOLUME: 103.2 fL — ABNORMAL HIGH (ref 77.6–95.7)
MEAN PLATELET VOLUME: 6.8 fL (ref 6.8–10.7)
MONOCYTES ABSOLUTE COUNT: 0.3 10*9/L (ref 0.3–0.8)
MONOCYTES RELATIVE PERCENT: 11.7 %
NEUTROPHILS ABSOLUTE COUNT: 1.3 10*9/L — ABNORMAL LOW (ref 1.8–7.8)
NEUTROPHILS RELATIVE PERCENT: 48.9 %
PLATELET COUNT: 125 10*9/L — ABNORMAL LOW (ref 150–450)
RED BLOOD CELL COUNT: 2.76 10*12/L — ABNORMAL LOW (ref 3.95–5.13)
RED CELL DISTRIBUTION WIDTH: 22.1 % — ABNORMAL HIGH (ref 12.2–15.2)
WBC ADJUSTED: 2.7 10*9/L — ABNORMAL LOW (ref 3.6–11.2)

## 2022-05-26 LAB — LACTATE DEHYDROGENASE: LACTATE DEHYDROGENASE: 455 U/L — ABNORMAL HIGH (ref 120–246)

## 2022-05-26 LAB — SLIDE REVIEW

## 2022-05-26 MED ORDER — PREDNISONE 50 MG TABLET
ORAL_TABLET | 5 refills | 0 days | Status: CP
Start: 2022-05-26 — End: ?

## 2022-05-26 MED ADMIN — vinCRIStine (ONCOVIN) 2 mg in sodium chloride (NS) 0.9 % 25 mL IVPB: 2 mg | INTRAVENOUS | @ 20:00:00 | Stop: 2022-05-26

## 2022-05-26 MED ADMIN — heparin, porcine (PF) 100 unit/mL injection 500 Units: 500 [IU] | INTRAVENOUS | @ 20:00:00 | Stop: 2022-05-27

## 2022-05-26 NOTE — Unmapped (Signed)
Pt arrived to chair 39. No complaints noted. Tolerated tx well. AVS declined and discharged to home.

## 2022-05-26 NOTE — Unmapped (Addendum)
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 Of note, patient had 5 cycles of hyperCVAD + rituximab + dasatinib. She is currently on dasatinib 70 mg daily, which was recently held from 10/11-10/18 due to recent admission for febrile neutropenia. Patient reports that her temperature was slightly elevated last night (99.6 oF), but does not have a fever currently. She will occasionally take Tylenol for mild shoulder and neck pain/soreness, and she is good about checking her temperature first. Also denies bruising, bleeding, nausea, diarrhea, constipation, rash, and SOB. Patient said she has only been having one bowel movement a day (typically has at least 3 bowel movements/day). She tries to eat Raisin Bran and other foods with lots of fiber to produce more bowel movements. Miralax and senna threw off her bowel movements in the past and she prefers to take Dulcolax for any constipation. During her last visit, patient inquired about needing contraception to prevent spreading CMV etc, and we discussed using barrier method/condom during sexual activity. She is currently not on anything for menstrual suppression/contraception; stated that she was tried progesterone around 3 years ago and it affected her blood pressure, but she is open to seeing Gyn again for recommendations.    Labs: WBC 2.7, ANC 1.3, Hgb 10, PLT 125, CMP notable ALT increased to 81.   Vitals: BP 127/80, HR 97    Oncologic History:  Oncology History Overview Note   Referring/Local Oncologist:    Diagnosis:   10/29/2021  Bone marrow, left iliac, aspiration and biopsy  -  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 The majority of the abnormal cells (64/88) contained an additional BCR/ABL1 fusion signal, while 9/88 abnormal cells contained an additional ABL1 and ASS1 signal.       Genetics:   Karyotype/FISH:   RESULTS   Date Value Ref Range Status   10/28/2021   Final    NOTE: This report reflects a combined study from a peripheral blood and a bone marrow core biopsy. Eleven cells from the peripheral blood and nine cells from the bone marrow core biopsy were analyzed. The BCR/ABL1 FISH analysis was performed on the peripheral blood.     Abnormal Karyotype:  47,XX,t(9;22)(q34;q11.2),+der(22)t(9;22)[18]/46,XX[2]    Abnormal FISH:  A BCR/ABL1 interphase FISH assay showed a signal pattern consistent with a BCR::ABL1 rearrangement and the 9;22 translocation in 88% of the 100 cells scored. The majority of the abnormal cells (64/88) contained an additional BCR/ABL1 fusion signal, while 9/88 abnormal cells contained an additional ABL1 and ASS1 signal.           Pertinent Phenotypic data:  CD19 98%  CD20 on diagnosis 51%  CD22 98%      Treatment Timeline:  10/29/2021: Bone marrow biopsy: Ph+ ALL, 51% expression of CD20  10/30/21: Cycle 1 day 1 GRAAPH-2005 induction  11/03/21: ITT #  1   11/12/2021: IT #2  11/18/2021: IT #3  11/29/2021: Post cycle 1 bone marrow biopsy: Morphologic CR.  MRD by flow insufficient, p190 2/100,000  12/10/2021: Cycle 2 B cycle + rituximab  12/14/21: IT#4  12/17/21: IT#5  01/10/22: Cycle 3 A cycle + rituximab  01/24/22: Bmbx - MRD-neg by PCR (p190)   02/17/22: C4 B cycle + ritux  02/18/22: IT#6  03/21/22: C5 A cycle + ritux (4th dose)  03/22/22: IT#7  04/29/22: Bmbx - MRD-neg by PCR (p190)   05/18/22: restart dasatinib 70mg  s/p neutropenia  05/26/22: Start maintenance vincristine 2g, Prednisone 200mg  D1-4   06/02/22: PLAN IT #8       B-cell acute lymphoblastic leukemia (ALL) (CMS-HCC)   10/29/2021 Initial Diagnosis    B-cell acute lymphoblastic leukemia (ALL) (CMS-HCC)     10/30/2021 - 03/30/2022 Chemotherapy    IP/OP LEUKEMIA GRAAPH-2005 < 60 YO (OP PEGFILGRASTIM ON DAY 7)  [No description for this plan]     01/06/2022 Endocrine/Hormone Therapy    OP LEUPROLIDE (LUPRON) 11.25 MG EVERY 3 MONTHS  Plan Provider: [name], MD     05/26/2022 -  Chemotherapy    OP LEUKEMIA VINCRISTINE  vinCRIStine 2 mg IV on day 1         Weight and Vitals:  Wt Readings from Last 3 Encounters:   05/26/22 83 kg (182 lb 15.7 oz)   05/13/22 79.4 kg (175 lb)   05/05/22 81.8 kg (180 lb 5.4 oz)     Temp Readings from Last 3 Encounters:   05/26/22 37.2 ??C (99 ??F) (Oral)   05/26/22 37.1 ??C (98.8 ??F) (Temporal)   05/15/22 36.8 ??C (98.2 ??F) (Oral)     BP Readings from Last 3 Encounters:   05/26/22 127/80   05/26/22 118/72   05/15/22 118/87     Pulse Readings from Last 3 Encounters:   05/26/22 97   05/26/22 97   05/15/22 95       Pertinent Labs:  Lab on 05/26/2022   Component Date Value Ref Range Status    ABO Grouping 05/26/2022 O POS   Final    Antibody Screen 05/26/2022 NEG   Final    Sodium 05/26/2022 140  135 - 145 mmol/L Final    Potassium 05/26/2022 3.8  3.4 - 4.8 mmol/L Final    Chloride 05/26/2022 106  98 - 107 mmol/L Final    CO2 05/26/2022 26.0  20.0 - 31.0 mmol/L Final    Anion Gap 05/26/2022 8  5 - 14 mmol/L Final    BUN 05/26/2022 17  9 - 23 mg/dL Final    Creatinine 16/05/9603 0.66  0.55 - 1.02 mg/dL Final    BUN/Creatinine Ratio 05/26/2022 26   Final    eGFR CKD-EPI (2021) Female 05/26/2022 >90  >=60 mL/min/1.52m2 Final    eGFR calculated with CKD-EPI 2021 equation in accordance with [employer] and [employer] of Nephrology Task Force recommendations.    Glucose 05/26/2022 182 (H)  70 - 179 mg/dL Final    Calcium 54/04/8118 8.7  8.7 - 10.4 mg/dL Final    Albumin 14/78/2956 3.9  3.4 - 5.0 g/dL Final    Total Protein 05/26/2022 5.9  5.7 - 8.2 g/dL Final    Total Bilirubin 05/26/2022 0.7  0.3 - 1.2 mg/dL Final    AST 21/30/8657 54 (H)  <=34 U/L Final    ALT 05/26/2022 81 (H)  10 - 49 U/L Final    Alkaline Phosphatase 05/26/2022 79  46 - 116 U/L Final    LDH 05/26/2022 455 (H)  120 - 246 U/L Final    WBC 05/26/2022 2.7 (L)  3.6 - 11.2 10*9/L Final    RBC 05/26/2022 2.76 (L)  3.95 - 5.13 10*12/L Final    HGB 05/26/2022 10.0 (L)  11.3 - 14.9 g/dL Final    HCT 16/05/9603 28.5 (L)  34.0 - 44.0 % Final    MCV 05/26/2022 103.2 (H)  77.6 - 95.7 fL Final    MCH 05/26/2022 36.1 (H)  25.9 - 32.4 pg Final    MCHC 05/26/2022 35.0  32.0 - 36.0 g/dL Final    RDW 54/04/8118 22.1 (H)  12.2 - 15.2 % Final    MPV 05/26/2022 6.8  6.8 - 10.7 fL Final    Platelet 05/26/2022 125 (L)  150 - 450 10*9/L Final    Neutrophils % 05/26/2022 48.9  % Final    Lymphocytes % 05/26/2022 38.4  % Final    Monocytes % 05/26/2022 11.7  % Final    Eosinophils % 05/26/2022 0.5  % Final    Basophils % 05/26/2022 0.5  % Final    Absolute Neutrophils 05/26/2022 1.3 (L)  1.8 - 7.8 10*9/L Final    Absolute Lymphocytes 05/26/2022 1.0 (L)  1.1 - 3.6 10*9/L Final    Absolute Monocytes 05/26/2022 0.3  0.3 - 0.8 10*9/L Final    Absolute Eosinophils 05/26/2022 0.0  0.0 - 0.5 10*9/L Final    Absolute Basophils 05/26/2022 0.0  0.0 - 0.1 10*9/L Final    Macrocytosis 05/26/2022 Slight (A)  Not Present Final    Anisocytosis 05/26/2022 Marked (A)  Not Present Final    Smear Review Comments 05/26/2022 See Comment (A)  Undefined Final    Slide Reviewed. Instrument ID [id number]  Myelocytes present - rare.       Allergies:   Allergies   Allergen Reactions    Erythromycin Hives     Other reaction(s): Not available    Other      Pt cant take ibuprofen due to condition. Sulfa (Sulfonamide Antibiotics) Anaphylaxis     Other reaction(s): Not available    Azithromycin        Drug Interactions: none identified; dasatinib is a major 3A4 substrate, caution with 3A4 inh/ind; fluconazole max dose with VCR is 200 mg daily; avoid acid reducers with  dasatinib.       Current Medications:  Current Outpatient Medications   Medication Sig Dispense Refill    acetaminophen (TYLENOL 8 HOUR) 650 MG CR tablet Take 2 tablets (1,300 mg total) by mouth every eight (8) hours as needed for pain.      albuterol HFA 90 mcg/actuation inhaler Inhale 2 puffs every six (6) hours as needed for wheezing.      cetirizine (ZYRTEC) 10 MG tablet Take 1 tablet (10 mg total) by mouth nightly. 30 tablet 2    dapsone 100 MG tablet Take 1 tablet (100 mg total) by mouth daily. 30 tablet 2    dasatinib (SPRYCEL) 70 MG tablet Take 1 tablet (70 mg total) by mouth daily. 30 tablet 7    escitalopram oxalate (LEXAPRO) 10 MG tablet Take 1 tablet (10 mg total) by mouth daily.      levalbuterol (XOPENEX) 0.31 mg/3 mL nebulizer solution Inhale 3 mL (0.31 mg total) by nebulization every four (4) hours as needed for wheezing (cough). 45 mL 0    oxyCODONE (ROXICODONE) 5 MG immediate release tablet Take 1-2 tablets (5-10 mg total) by  mouth every four (4) hours as needed for pain      valACYclovir (VALTREX) 500 MG tablet Take 1 tablet (500 mg total) by mouth daily. 90 tablet 3    fluconazole (DIFLUCAN) 200 MG tablet Take 1 tablet (200 mg total) by mouth daily.      predniSONE (DELTASONE) 50 MG tablet Take 4 tablets (200 mg total) by mouth once daily on days 1-5 of each cycle. 20 tablet 5     No current facility-administered medications for this visit.     Facility-Administered Medications Ordered in Other Visits   Medication Dose Route Frequency Provider Last Rate Last Admin    heparin, porcine (PF) 100 unit/mL injection 500 Units  500 Units Intravenous Q30 Min PRN [name], MD   500 Units at 05/26/22 1601    OKAY TO SEND MEDICATION/CHEMOTHERAPY TO OUTPATIENT UNIT   Other Once [name], MD        sodium chloride (NS) 0.9 % infusion  100 mL/hr Intravenous Continuous [name], MD           Adherence: No barriers identified/no missed doses.       Assessment: Patricia [name] is a 46 y.o. 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 Patient finished 5 cycles of hyperCVAD + rituximab + dasatinib. She is currently on dasatinib 70 mg, but it was recently held due to her being admitted for neutropenic fever. Patient's ANC remains stable. Continues to endorse low-grade fevers but nothing >100.4. Will refer to gyn for contraception options.     Plan:     Ph+ ALL  - START vincristine IV on day 1 of 28 day cycle  - START prednisone 200 mg (4 tablets) days 1-5 of 28 day cycle  - Continue dasatinib 70 mg daily  - # 8 of 12 IT therapy planned 11/2  - Weekly labs to start with, phone call with pharmacy in 2 weeks  - RTC 11/27 for C2D1 (including LP)    Infection ppx  -Continue dapsone 100 mg PO daily  -Continue valacyclovir 500 mg PO daily  -Can resume levofloxacin/fluconazole 200 mg only if ANC<0.5     CMV  -Weekly CMV levels per ICID, can continue for ~1 mo and if remains negative can consider stopping CMV monitoring    Supportive Care  - Continue Tylenol prn for pain. Check temperature before Tylenol dose and avoid taking NSAIDs.  - Can take Dulcolax prn for constipation  - Can take famotidine if having acid reflex from steroids. Space famotidine 2 hours after dasatinib dose.   - See gynecology to discuss contraception. She has been receiving 3-mo Lupron for menses suppression/fertility preservation while on chemotherapy and would be due for dose come ~12/7, however ideally would transition to contraception prior to that.     F/u:  Future Appointments   Date Time Provider Department Center   06/02/2022  7:30 AM ADULT ONC LAB [facility]   06/02/2022 8:30 AM ONCINF CHAIR 12 [id number] [facility]   06/02/2022 10:30 AM [facility] FLUORO RM 6 IFLUOUW [place]   06/14/2022  8:30 AM Ortencia Kick, MD ONCMULTI [facility]   06/27/2022  9:30 AM ADULT ONC LAB [facility]   06/27/2022 10:30 AM Lenon Ahmadi, AGNP HON[facility]   06/27/2022 11:00 AM ONCHEM LEUKEMIA PHARMACIST [facility]   06/27/2022 11:30 AM ONCINF CHAIR 20 [facility]   06/27/2022  1:30 PM [facility]  FLUORO RM 9 IFLUOUW [place]   06/30/2022  1:55 PM Idelia Salm, MD [facility]       I spent 20 minutes with Ms.Vandewalle in direct patient care.     Edrick Kins, PharmD Candidate    Ronnald Collum, PharmD, BCOP, CPP  Hematology/Oncology Clinical Pharmacist  Pager [phone]

## 2022-05-26 NOTE — Unmapped (Addendum)
You are doing well!    Follow the calendar from the pharmacy.     I recommend a Covid shot in the near future.    Please get labs every week. They can be in [place] when you do not have another appointment at [facility].    I will see you back in 4 weeks for the next cycle of treatment.

## 2022-05-26 NOTE — Unmapped (Signed)
You are starting maintenance!  -IV vincristine every 28 days  -Continue dasatinib 1 tablet (70 mg total) by mouth once daily  -START prednisone 200 mg (4 x 50 mg tablets) by mouth once daily. This will only be on days 1 through 5 of each cycle. Take prednisone in the morning and after some food.   -No need for levofloxacin and fluconazole for now  -Weekly labs to start with (can do at [facility] if not coming to [facility])  -IT chemotherapy on 06/02/22 (we will get labs this day)  -Pharmacy phone call in 2 weeks

## 2022-05-26 NOTE — Unmapped (Signed)
[facility] Follow Up Visit Note     [name]  Patient Age: 46 y.o.  Encounter Date: 05/26/2022    Primary Care Provider:  [name], MD    Referring Physician:  Oneita Hurt, None Per Patient  [address] [place],  [place] [address]    Diagnosis:  Ph+ B-ALL  Treatment:   Maintenance: VCR, Pred, Dasatinib   Approach/Goals:  Curative  Cancer Status:  Morphologic CR, BCR/ABL p190 negative in marrow  Comorbities:   HTN, anxiety, hemorrhoids with anal fissures    Assessment/Plan:  Patricia [name] is a 46 y.o. female with past medical history of chronic right shoulder and back pain, hypertension, anxiety, and recently diagnosed Ph+ ALL. 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  She has completed 5 cycles of hyperCVAD.  She has not tolerated therapy very well and has had multiple hospitalizations for infections.  Given her deep molecular remission and her intolerance of hyperCVAD, we are transitioning to maintenance chemotherapy.     She is s/p recent hospitalization for fevers. The workup was negative aside from CMV, which is now negative. She has been fever free and feeling well. She started dasatinib 2 weeks ago and ANC remains sufficient (1.3). Based on this, we will start maintenance therapy with close monitoring.    Ph+ALL, in remission: On GRAAPH 2005 + dasatinib.   - START Cycle 1 Maintenance  - Dasatinib 70 mg, VCR, pred 100 D1-5   - Plan to continue IT chemo with goal of 12     - #8 next week  - q 3 month BCR/ABL - due 07/2022  - q 6 month bmbx - next - due 09/2021  - plan blina if ALL progresses    Hemorrhoids, Anal fissure: persistent but stable, per patient 05/26/2022  - supportive care at home    CMV:  - weekly CMV levels  - Dr. 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    Pancytopenia due to chemotherapy  - ANC 1.3, platelets WNL 05/26/2022  - supportive care as detailed below when in nadir    Menses suppression and birth control: Lupron  - most recent dose 04/07/22  - next due 07/07/22 - discussed options and she prefers to continue Lupron. Will need birth control while on chemo, including when on only TKI -     Psychosocial distress: She reports a moderate level of anxiety regarding the management of the above.   - Counseling given   - Consider ref to comprehensive cancer support program     Patient-centered care/Shared decision-making:   We discussed the plan above at length. The patient and her husband actively contributed to the conversation. Specifically, her most important outcomes are:   Prolonging overall survival and Maintaining her overall functional activity     Supportive Care Needs: We recommend based on the patient???s underlying diagnosis and treatment history the following supportive care:    1. Antimicrobial prophylaxis:  ALL (in remission, receiving post-remission therapy): Bacterial: Levofloxacin 500mg  PO daily (when absolute neutrophils </= 0.5);   Fungal: Fluconazole 200mg  PO daily (when absolute neutrophils </= 0.5);   Viral: Valacyclovir 500mg  PO daily (Continuous);   PJP:  SMX/TMP DS PO BID twice weekly (Sat/Sun)    2. Blood product support:  Leukoreduced blood products are required.  Irradiated blood products are preferred, but in case of urgent transfusion needs non-irradiated blood products may be used: 2 units for Hg <=8.0.     Coordination  of care:   - Start maintenance   - weekly lab checks    [name], MD   Leukemia Program  Division of Hematology  [facility]      Nurse Navigator (non-clinical trial patients): Elicia Lamp, RN        Tel. [phone]       Fax. [phone]  Toll-free appointments: [phone]  Scheduling assistance: [phone]  After hours/weekends: [phone] (ask for adult hematology/oncology on-call)      History of Present Illness:   Patricia [name] is a 46 y.o. female with past medical history noted as above who presents for follow up Ph+ ALL.      Her social history is notable for being a Runner, broadcasting/film/video. She was recently married. She lives with her husband.    Interim history  She reports feeling well. She has not had any further fevers. She did have temps in mid 99's the past 2 nights.  She has some neck aches but thinks this is related to be more active.  Hemorrhoids are stable.  No GI complaints  No numbness/tingling in fingers/toes    Oncology History is as below:   Oncology History Overview Note   Referring/Local Oncologist:    Diagnosis:   10/29/2021  Bone marrow, left iliac, aspiration and biopsy  -  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 The majority of the abnormal cells (64/88) contained an additional BCR/ABL1 fusion signal, while 9/88 abnormal cells contained an additional ABL1 and ASS1 signal.       Genetics:   Karyotype/FISH:   RESULTS   Date Value Ref Range Status   10/28/2021   Final    NOTE: This report reflects a combined study from a peripheral blood and a bone marrow core biopsy. Eleven cells from the peripheral blood and nine cells from the bone marrow core biopsy were analyzed. The BCR/ABL1 FISH analysis was performed on the peripheral blood.     Abnormal Karyotype:  47,XX,t(9;22)(q34;q11.2),+der(22)t(9;22)[18]/46,XX[2]    Abnormal FISH:  A BCR/ABL1 interphase FISH assay showed a signal pattern consistent with a BCR::ABL1 rearrangement and the 9;22 translocation in 88% of the 100 cells scored. The majority of the abnormal cells (64/88) contained an additional BCR/ABL1 fusion signal, while 9/88 abnormal cells contained an additional ABL1 and ASS1 signal.           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  MRD by flow insufficient, p190 2/100,000  12/10/2021: Cycle 2 B cycle + rituximab  12/14/21: IT#4  12/17/21: IT#5  01/10/22: Cycle 3 A cycle + rituximab  01/24/22: Bmbx - MRD-neg by PCR (p190)   02/17/22: C4 B cycle + ritux  02/18/22: IT#6  03/21/22: C5 A cycle + ritux (4th dose)  03/22/22: IT#7  04/29/22: Bmbx - MRD-neg by PCR (p190)   05/18/22: restart dasatinib 70mg  s/p neutropenia  05/26/22: Start maintenance vincristine  2g, Prednisone 200mg  D1-4   06/02/22: PLAN IT #8           Review of Systems:   ROS reviewed and negative except as noted in H and P     Allergies:  Allergies   Allergen Reactions    Erythromycin Hives     Other reaction(s): Not available    Other      Pt cant take ibuprofen due to condition.    Sulfa (Sulfonamide Antibiotics) Anaphylaxis     Other reaction(s): Not available    Azithromycin        Medications:     Current Outpatient Medications:     acetaminophen (TYLENOL 8 HOUR) 650 MG CR tablet, Take 2 tablets (1,300 mg total) by mouth every eight (8) hours as needed for pain., Disp: , Rfl:     albuterol HFA 90 mcg/actuation inhaler, Inhale 2 puffs every six (6) hours as needed for wheezing., Disp: , Rfl:     cetirizine (ZYRTEC) 10 MG tablet, Take 1 tablet (10 mg total) by mouth nightly., Disp: 30 tablet, Rfl: 2    dapsone 100 MG tablet, Take 1 tablet (100 mg total) by mouth daily., Disp: 30 tablet, Rfl: 2    dasatinib (SPRYCEL) 70 MG tablet, Take 1 tablet (70 mg total) by mouth daily., Disp: 30 tablet, Rfl: 7    escitalopram oxalate (LEXAPRO) 10 MG tablet, Take 1 tablet (10 mg total) by mouth daily., Disp: , Rfl:     fluconazole (DIFLUCAN) 200 MG tablet, Take 1 tablet (200 mg total) by mouth daily., Disp: , Rfl:     levalbuterol (XOPENEX) 0.31 mg/3 mL nebulizer solution, Inhale 3 mL (0.31 mg total) by nebulization every four (4) hours as needed for wheezing (cough)., Disp: 45 mL, Rfl: 0    oxyCODONE (ROXICODONE) 5 MG immediate release tablet, Take 1-2 tablets (5-10 mg total) by mouth every four (4) hours as needed for pain, Disp: , Rfl:     valACYclovir (VALTREX) 500 MG tablet, Take 1 tablet (500 mg total) by mouth daily., Disp: 90 tablet, Rfl: 3    predniSONE (DELTASONE) 50 MG tablet, Take 4 tablets (200 mg total) by mouth once daily on days 1-5 of each cycle., Disp: 20 tablet, Rfl: 5  No current facility-administered medications for this visit.    Facility-Administered Medications Ordered in Other Visits:     heparin, porcine (PF) 100 unit/mL injection 500 Units, 500 Units, Intravenous, Q30 Min PRN, [name], MD, 500 Units at 05/26/22 1601    OKAY TO SEND MEDICATION/CHEMOTHERAPY TO OUTPATIENT UNIT, , Other, Once, [name], MD    sodium chloride (NS) 0.9 % infusion, 100 mL/hr, Intravenous, Continuous, [name], MD    Medical History:  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       Social History:  Social History     Social History Narrative    Not on file       Family History:  No family history on file.    Objective:   BP 118/72  - Pulse 97  - Temp 37.1 ??C (98.8 ??F) (Temporal)  - Resp 15  - Ht 167.6 cm (5' 5.98)  - Wt 83 kg (182 lb 15.7 oz)  - SpO2 95%  - BMI 29.55 kg/m??     Physical Exam:  GENERAL: well-appearing  woman unaccompanied  HEART: Normal color, not excessive pallor.  CHEST/LUNG: Normal work of breathing. No  dyspnea with conversation. No crackles. Tachycardic regular rthymn  ABDOMEN: No obvious distention.    EXTREMITIES: No edema, cyanosis or clubbing.   SKIN: No noticable rash or petechiae.   NEURO EXAM: Grossly intact.       Test Results:  05/26/2022 CBC/d and CMP reviewed with patient

## 2022-05-26 NOTE — Unmapped (Signed)
Lab on 05/26/2022   Component Date Value Ref Range Status    ABO Grouping 05/26/2022 O POS   Final    Antibody Screen 05/26/2022 NEG   Final    Sodium 05/26/2022 140  135 - 145 mmol/L Final    Potassium 05/26/2022 3.8  3.4 - 4.8 mmol/L Final    Chloride 05/26/2022 106  98 - 107 mmol/L Final    CO2 05/26/2022 26.0  20.0 - 31.0 mmol/L Final    Anion Gap 05/26/2022 8  5 - 14 mmol/L Final    BUN 05/26/2022 17  9 - 23 mg/dL Final    Creatinine 16/05/9603 0.66  0.55 - 1.02 mg/dL Final    BUN/Creatinine Ratio 05/26/2022 26   Final    eGFR CKD-EPI (2021) Female 05/26/2022 >90  >=60 mL/min/1.34m2 Final    eGFR calculated with CKD-EPI 2021 equation in accordance with [employer] and [employer] of Nephrology Task Force recommendations.    Glucose 05/26/2022 182 (H)  70 - 179 mg/dL Final    Calcium 54/04/8118 8.7  8.7 - 10.4 mg/dL Final    Albumin 14/78/2956 3.9  3.4 - 5.0 g/dL Final    Total Protein 05/26/2022 5.9  5.7 - 8.2 g/dL Final    Total Bilirubin 05/26/2022 0.7  0.3 - 1.2 mg/dL Final    AST 21/30/8657 54 (H)  <=34 U/L Final    ALT 05/26/2022 81 (H)  10 - 49 U/L Final    Alkaline Phosphatase 05/26/2022 79  46 - 116 U/L Final    LDH 05/26/2022 455 (H)  120 - 246 U/L Final    WBC 05/26/2022 2.7 (L)  3.6 - 11.2 10*9/L Final    RBC 05/26/2022 2.76 (L)  3.95 - 5.13 10*12/L Final    HGB 05/26/2022 10.0 (L)  11.3 - 14.9 g/dL Final    HCT 84/69/6295 28.5 (L)  34.0 - 44.0 % Final    MCV 05/26/2022 103.2 (H)  77.6 - 95.7 fL Final    MCH 05/26/2022 36.1 (H)  25.9 - 32.4 pg Final    MCHC 05/26/2022 35.0  32.0 - 36.0 g/dL Final    RDW 28/41/3244 22.1 (H)  12.2 - 15.2 % Final    MPV 05/26/2022 6.8  6.8 - 10.7 fL Final    Platelet 05/26/2022 125 (L)  150 - 450 10*9/L Final    Neutrophils % 05/26/2022 48.9  % Final    Lymphocytes % 05/26/2022 38.4  % Final    Monocytes % 05/26/2022 11.7  % Final    Eosinophils % 05/26/2022 0.5  % Final    Basophils % 05/26/2022 0.5  % Final    Absolute Neutrophils 05/26/2022 1.3 (L)  1.8 - 7.8 10*9/L Final    Absolute Lymphocytes 05/26/2022 1.0 (L)  1.1 - 3.6 10*9/L Final    Absolute Monocytes 05/26/2022 0.3  0.3 - 0.8 10*9/L Final    Absolute Eosinophils 05/26/2022 0.0  0.0 - 0.5 10*9/L Final    Absolute Basophils 05/26/2022 0.0  0.0 - 0.1 10*9/L Final    Macrocytosis 05/26/2022 Slight (A)  Not Present Final    Anisocytosis 05/26/2022 Marked (A)  Not Present Final    Smear Review Comments 05/26/2022 See Comment (A)  Undefined Final    Slide Reviewed. Instrument ID [id number]  Myelocytes present - rare.

## 2022-05-27 DIAGNOSIS — C9100 Acute lymphoblastic leukemia not having achieved remission: Principal | ICD-10-CM

## 2022-05-27 LAB — CMV DNA, QUANTITATIVE, PCR: CMV VIRAL LD: NOT DETECTED

## 2022-05-27 MED ORDER — HYDROCORTISONE 2.5 % TOPICAL CREAM
Freq: Two times a day (BID) | TOPICAL | 0 refills | 0 days | Status: CP
Start: 2022-05-27 — End: ?

## 2022-06-02 ENCOUNTER — Ambulatory Visit: Admit: 2022-06-02 | Discharge: 2022-06-03 | Payer: PRIVATE HEALTH INSURANCE

## 2022-06-02 ENCOUNTER — Encounter: Admit: 2022-06-02 | Discharge: 2022-06-03 | Payer: PRIVATE HEALTH INSURANCE

## 2022-06-02 ENCOUNTER — Other Ambulatory Visit: Admit: 2022-06-02 | Discharge: 2022-06-03 | Payer: PRIVATE HEALTH INSURANCE

## 2022-06-02 DIAGNOSIS — B259 Cytomegaloviral disease, unspecified: Principal | ICD-10-CM

## 2022-06-02 DIAGNOSIS — C9100 Acute lymphoblastic leukemia not having achieved remission: Principal | ICD-10-CM

## 2022-06-02 LAB — CBC W/ AUTO DIFF
BASOPHILS ABSOLUTE COUNT: 0 10*9/L (ref 0.0–0.1)
BASOPHILS RELATIVE PERCENT: 0.5 %
EOSINOPHILS ABSOLUTE COUNT: 0 10*9/L (ref 0.0–0.5)
EOSINOPHILS RELATIVE PERCENT: 1.6 %
HEMATOCRIT: 25.1 % — ABNORMAL LOW (ref 34.0–44.0)
HEMOGLOBIN: 8.7 g/dL — ABNORMAL LOW (ref 11.3–14.9)
LYMPHOCYTES ABSOLUTE COUNT: 0.4 10*9/L — ABNORMAL LOW (ref 1.1–3.6)
LYMPHOCYTES RELATIVE PERCENT: 12.9 %
MEAN CORPUSCULAR HEMOGLOBIN CONC: 34.7 g/dL (ref 32.0–36.0)
MEAN CORPUSCULAR HEMOGLOBIN: 37 pg — ABNORMAL HIGH (ref 25.9–32.4)
MEAN CORPUSCULAR VOLUME: 106.7 fL — ABNORMAL HIGH (ref 77.6–95.7)
MEAN PLATELET VOLUME: 7.3 fL (ref 6.8–10.7)
MONOCYTES ABSOLUTE COUNT: 0.3 10*9/L (ref 0.3–0.8)
MONOCYTES RELATIVE PERCENT: 9.9 %
NEUTROPHILS ABSOLUTE COUNT: 2.2 10*9/L (ref 1.8–7.8)
NEUTROPHILS RELATIVE PERCENT: 75.1 %
PLATELET COUNT: 85 10*9/L — ABNORMAL LOW (ref 150–450)
RED BLOOD CELL COUNT: 2.36 10*12/L — ABNORMAL LOW (ref 3.95–5.13)
RED CELL DISTRIBUTION WIDTH: 19.9 % — ABNORMAL HIGH (ref 12.2–15.2)
WBC ADJUSTED: 2.9 10*9/L — ABNORMAL LOW (ref 3.6–11.2)

## 2022-06-02 LAB — HEMATOPATHOLOGY LEUKEMIA/LYMPHOMA FLOW CYTOMETRY, CSF
LYMPHS CSF: 68.9 %
MONO/MACROPHAGE CSF: 31.1 %
NUCLEATED CELLS, CSF: 3 ul (ref 0–5)
NUMBER OF CELLS CSF: 74
RBC CSF: 28 ul — ABNORMAL HIGH

## 2022-06-02 LAB — CMV DNA, QUANTITATIVE, PCR
CMV QUANT LOG(10): 2.83 {Log_IU}/mL — ABNORMAL HIGH (ref <0.00)
CMV QUANT: 670 [IU]/mL — ABNORMAL HIGH (ref <0)
CMV VIRAL LD: DETECTED — AB

## 2022-06-02 MED ADMIN — heparin, porcine (PF) 100 unit/mL injection 500 Units: 500 [IU] | INTRAVENOUS | @ 16:00:00 | Stop: 2022-06-03

## 2022-06-02 MED ADMIN — LORazepam (ATIVAN) injection 1 mg: 1 mg | INTRAVENOUS | @ 14:00:00 | Stop: 2022-06-02

## 2022-06-02 MED ADMIN — LORazepam (ATIVAN) 2 mg/mL injection: INTRAVENOUS | @ 14:00:00 | Stop: 2022-06-02

## 2022-06-02 MED ADMIN — methotrexate (Preservative Free) 12 mg, hydrocortisone sod succ (Solu-CORTEF) 50 mg in sodium chloride (NS) 0.9 % 6 mL INTRATHECAL syringe: INTRATHECAL | @ 15:00:00 | Stop: 2022-06-02

## 2022-06-03 NOTE — Unmapped (Signed)
Labs found to be within parameters for treatment today. Request for drug sent to pharmacy.  Labs are within parameters today for the patient to have their procedure. Patient sent on to procedure appointment.

## 2022-06-03 NOTE — Unmapped (Signed)
[name] has been contacted in regards to their refill of Sprycel. At this time, they have declined refill due to patient waiting on therapy change,due numbers increasing. Patient requested a follow up call after next Thursday. Refill assessment call date has been updated per the patient's request.

## 2022-06-06 ENCOUNTER — Institutional Professional Consult (permissible substitution): Admit: 2022-06-06 | Discharge: 2022-06-06 | Payer: PRIVATE HEALTH INSURANCE

## 2022-06-06 DIAGNOSIS — B259 Cytomegaloviral disease, unspecified: Principal | ICD-10-CM

## 2022-06-06 DIAGNOSIS — C9100 Acute lymphoblastic leukemia not having achieved remission: Principal | ICD-10-CM

## 2022-06-06 LAB — CBC W/ AUTO DIFF
BASOPHILS ABSOLUTE COUNT: 0 10*9/L (ref 0.0–0.1)
BASOPHILS RELATIVE PERCENT: 0.4 %
EOSINOPHILS ABSOLUTE COUNT: 0.1 10*9/L (ref 0.0–0.5)
EOSINOPHILS RELATIVE PERCENT: 6.4 %
HEMATOCRIT: 23.1 % — ABNORMAL LOW (ref 34.0–44.0)
HEMOGLOBIN: 7.9 g/dL — ABNORMAL LOW (ref 11.3–14.9)
LYMPHOCYTES ABSOLUTE COUNT: 0.5 10*9/L — ABNORMAL LOW (ref 1.1–3.6)
LYMPHOCYTES RELATIVE PERCENT: 26.2 %
MEAN CORPUSCULAR HEMOGLOBIN CONC: 34.1 g/dL (ref 32.0–36.0)
MEAN CORPUSCULAR HEMOGLOBIN: 37.8 pg — ABNORMAL HIGH (ref 25.9–32.4)
MEAN CORPUSCULAR VOLUME: 110.8 fL — ABNORMAL HIGH (ref 77.6–95.7)
MEAN PLATELET VOLUME: 7.5 fL (ref 6.8–10.7)
MONOCYTES ABSOLUTE COUNT: 0.1 10*9/L — ABNORMAL LOW (ref 0.3–0.8)
MONOCYTES RELATIVE PERCENT: 8.2 %
NEUTROPHILS ABSOLUTE COUNT: 1 10*9/L — ABNORMAL LOW (ref 1.8–7.8)
NEUTROPHILS RELATIVE PERCENT: 58.8 %
NUCLEATED RED BLOOD CELLS: 0 /100{WBCs} (ref <=4)
PLATELET COUNT: 78 10*9/L — ABNORMAL LOW (ref 150–450)
RED BLOOD CELL COUNT: 2.09 10*12/L — ABNORMAL LOW (ref 3.95–5.13)
RED CELL DISTRIBUTION WIDTH: 19.1 % — ABNORMAL HIGH (ref 12.2–15.2)
WBC ADJUSTED: 1.7 10*9/L — ABNORMAL LOW (ref 3.6–11.2)

## 2022-06-06 LAB — BASIC METABOLIC PANEL
ANION GAP: 4 mmol/L — ABNORMAL LOW (ref 5–14)
BLOOD UREA NITROGEN: 13 mg/dL (ref 9–23)
BUN / CREAT RATIO: 21
CALCIUM: 9.2 mg/dL (ref 8.7–10.4)
CHLORIDE: 111 mmol/L — ABNORMAL HIGH (ref 98–107)
CO2: 29.3 mmol/L (ref 20.0–31.0)
CREATININE: 0.62 mg/dL
EGFR CKD-EPI (2021) FEMALE: >90 mL/min/{1.73_m2} (ref >=60)
GLUCOSE RANDOM: 188 mg/dL — ABNORMAL HIGH (ref 70–179)
POTASSIUM: 3.9 mmol/L (ref 3.4–4.8)
SODIUM: 144 mmol/L (ref 135–145)

## 2022-06-06 LAB — MAGNESIUM: MAGNESIUM: 2 mg/dL (ref 1.6–2.6)

## 2022-06-06 LAB — PHOSPHORUS: PHOSPHORUS: 3.6 mg/dL (ref 2.4–5.1)

## 2022-06-06 MED ADMIN — heparin, porcine (PF) 100 unit/mL injection 500 Units: 500 [IU] | INTRAVENOUS | @ 18:00:00 | Stop: 2022-06-06

## 2022-06-06 NOTE — Unmapped (Signed)
Pt to clinic for standing blood work, Hgb resulted at 7.9 and pt states she does not feel good and very fatigued.  Appointment made for transfusion tomorrow 11/7 per therapy plan and pt's provider, Dellis Anes made aware via epic chat.

## 2022-06-07 ENCOUNTER — Ambulatory Visit: Admit: 2022-06-07 | Discharge: 2022-06-08 | Payer: PRIVATE HEALTH INSURANCE

## 2022-06-07 DIAGNOSIS — C9100 Acute lymphoblastic leukemia not having achieved remission: Principal | ICD-10-CM

## 2022-06-07 LAB — CMV DNA, QUANTITATIVE, PCR
CMV QUANT LOG(10): 2.35 {Log_IU}/mL — ABNORMAL HIGH (ref <0.00)
CMV QUANT: 223 [IU]/mL — ABNORMAL HIGH (ref <0)
CMV VIRAL LD: DETECTED — AB

## 2022-06-07 MED ADMIN — heparin, porcine (PF) 100 unit/mL injection 500 Units: 500 [IU] | INTRAVENOUS | @ 20:00:00 | Stop: 2022-06-07

## 2022-06-07 MED ADMIN — acetaminophen (TYLENOL) tablet 650 mg: 650 mg | ORAL | @ 16:00:00 | Stop: 2022-06-07

## 2022-06-07 NOTE — Unmapped (Cosign Needed)
Oncology Massage Therapy Note     Name: Patricia [name]  MRN: [id number]  Date: 06/07/2022     Current Oncology Treatment: Chemotherapy     None     None     Mask     Observations  Patient presented seated in recliner in Infusion area.  Upon offer, patient stated they were amenable to massage therapy today  Patient appeared calm and relaxed during the massage session       Provided gentle effleurage and light compression massage to shoulders and left leg for 15 minutes     Patient Evaluation  Before Massage:  Patient stated she was ok     After Massage:    Patient stated she was more relaxed

## 2022-06-07 NOTE — Unmapped (Signed)
Patient arrived in the infusion clinic at 11:25am.  Weight and Vitals were obtained. Medial side of port was accessed,  flushed with blood return, dressing applied-clean, dry, and intact. Hgb drawn 11/6 resulted at 7.9 -pt to receive 2 units PRBC's per therapy plan today. Patient was administered premedications at 11:27am.  Patient was administered 2 units PRBC's as ordered without complications while in the clinic.  Port was heparin flushed and de-accessed, covered with 2x2 gauze and bandaid. Appointments made for the weekly lab checks here in [place] per provider team request x 2 weeks out. Patient declined after visit summary then discharged home to self care.

## 2022-06-08 ENCOUNTER — Institutional Professional Consult (permissible substitution): Admit: 2022-06-08 | Discharge: 2022-06-09 | Payer: PRIVATE HEALTH INSURANCE

## 2022-06-08 NOTE — Unmapped (Addendum)
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 Reports she has had more low-grade fevers, highest being 101 which was on Nov 1st, but more so 99-range more recently than that. Has had some intermittent headaches that do not last long. Did have diarrhea but may have been diet-related. Did take prednisone though she had trouble with taste of tablets. Also missed one dose of dasatinib just from forgetting. Set to follow up with Gyn in [place]. We discussed role of Nivestym giving ANC trend and she has supply but we discussed no indication for this currently. Also discussed CMV levels and plan/recommendations from ID team regarding this. She is more comfortable with frequent labs so she can monitor ANC and be able to assess infection risk but she is mostly staying home and being careful. She did receive blood transfusion yesterday. Also notes some tachycardia when up and moving.     Labs: WBC 1.7, ANC 1.0, Hgb 7.9, PLT 78, CMP stable; CMV viral load 223    Oncologic History:  Oncology History Overview Note   Referring/Local Oncologist:    Diagnosis:   10/29/2021  Bone marrow, left iliac, aspiration and biopsy  -  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 The majority of the abnormal cells (64/88) contained an additional BCR/ABL1 fusion signal, while 9/88 abnormal cells contained an additional ABL1 and ASS1 signal.       Genetics:   Karyotype/FISH:   RESULTS   Date Value Ref Range Status   10/28/2021   Final    NOTE: This report reflects a combined study from a peripheral blood and a bone marrow core biopsy. Eleven cells from the peripheral blood and nine cells from the bone marrow core biopsy were analyzed. The BCR/ABL1 FISH analysis was performed on the peripheral blood.     Abnormal Karyotype:  47,XX,t(9;22)(q34;q11.2),+der(22)t(9;22)[18]/46,XX[2]    Abnormal FISH:  A BCR/ABL1 interphase FISH assay showed a signal pattern consistent with a BCR::ABL1 rearrangement and the 9;22 translocation in 88% of the 100 cells scored. The majority of the abnormal cells (64/88) contained an additional BCR/ABL1 fusion signal, while 9/88 abnormal cells contained an additional ABL1 and ASS1 signal.           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  MRD by flow insufficient, p190 2/100,000  12/10/2021: Cycle 2 B cycle + rituximab  12/14/21: IT#4  12/17/21: IT#5  01/10/22: Cycle 3 A cycle + rituximab  01/24/22: Bmbx - MRD-neg by PCR (p190)   02/17/22: C4 B cycle +  ritux  02/18/22: IT#6  03/21/22: C5 A cycle + ritux (4th dose)  03/22/22: IT#7  04/29/22: Bmbx - MRD-neg by PCR (p190)   05/18/22: restart dasatinib 70mg  s/p neutropenia  05/26/22: Start maintenance vincristine 2g, Prednisone 200mg  D1-4   06/02/22: PLAN IT #8       B-cell acute lymphoblastic leukemia (ALL) (CMS-HCC)   10/29/2021 Initial Diagnosis    B-cell acute lymphoblastic leukemia (ALL) (CMS-HCC)     10/30/2021 - 03/30/2022 Chemotherapy    IP/OP LEUKEMIA GRAAPH-2005 < 60 YO (OP PEGFILGRASTIM ON DAY 7)  [No description for this plan]     01/06/2022 Endocrine/Hormone Therapy    OP LEUPROLIDE (LUPRON) 11.25 MG EVERY 3 MONTHS  Plan Provider: [name], MD     05/26/2022 -  Chemotherapy    OP LEUKEMIA VINCRISTINE  vinCRIStine 2 mg IV on day 1         Weight and Vitals:  Wt Readings from Last 3 Encounters:   06/07/22 82.9 kg (182 lb 12.2 oz)   05/26/22 83 kg (182 lb 15.7 oz)   05/13/22 79.4 kg (175 lb)     Temp Readings from Last 3 Encounters:   06/07/22 37 ??C (98.6 ??F) (Temporal)   05/26/22 37.2 ??C (99 ??F) (Oral)   05/26/22 37.1 ??C (98.8 ??F) (Temporal)     BP Readings from Last 3 Encounters:   06/07/22 120/77   06/02/22 126/85   05/26/22 127/80     Pulse Readings from Last 3 Encounters:   06/07/22 89   06/02/22 98   05/26/22 97       Pertinent Labs:  No visits with results within 1 Day(s) from this visit.   Latest known visit with results is:   Clinical Support on 06/06/2022   Component Date Value Ref Range Status    CMV Viral Ld 06/06/2022 Detected (A)  Not Detected Final    CMV Quant 06/06/2022 223 (H)  <0 IU/mL Final    CMV Quant Log(10) 06/06/2022 2.35 (H)  <0.00 log IU/mL Final    Sodium 06/06/2022 144  135 - 145 mmol/L Final    Potassium 06/06/2022 3.9  3.4 - 4.8 mmol/L Final    Chloride 06/06/2022 111 (H)  98 - 107 mmol/L Final    CO2 06/06/2022 29.3  20.0 - 31.0 mmol/L Final    Anion Gap 06/06/2022 4 (L)  5 - 14 mmol/L Final    BUN 06/06/2022 13  9 - 23 mg/dL Final    Creatinine 16/05/9603 0.62  0.55 - 1.02 mg/dL Final    BUN/Creatinine Ratio 06/06/2022 21   Final    eGFR CKD-EPI (2021) Female 06/06/2022 >90  >=60 mL/min/1.48m2 Final    eGFR calculated with CKD-EPI 2021 equation in accordance with [employer] and [employer] of Nephrology Task Force recommendations.    Glucose 06/06/2022 188 (H)  70 - 179 mg/dL Final    Calcium 54/04/8118 9.2  8.7 - 10.4 mg/dL Final    Magnesium 14/78/2956 2.0  1.6 - 2.6 mg/dL Final    Phosphorus 21/30/8657 3.6  2.4 - 5.1 mg/dL Final    WBC 84/69/6295 1.7 (L)  3.6 - 11.2 10*9/L Final    RBC 06/06/2022 2.09 (L)  3.95 - 5.13 10*12/L Final    HGB 06/06/2022 7.9 (L)  11.3 - 14.9 g/dL Final    HCT 28/41/3244 23.1 (L)  34.0 - 44.0 % Final    MCV 06/06/2022 110.8 (H)  77.6 - 95.7 fL Final    MCH  06/06/2022 37.8 (H)  25.9 - 32.4 pg Final    MCHC 06/06/2022 34.1  32.0 - 36.0 g/dL Final    RDW 69/62/9528 19.1 (H)  12.2 - 15.2 % Final    MPV 06/06/2022 7.5  6.8 - 10.7 fL Final    Platelet 06/06/2022 78 (L)  150 - 450 10*9/L Final    nRBC 06/06/2022 0  <=4 /100 WBCs Final    Neutrophils % 06/06/2022 58.8  % Final    Lymphocytes % 06/06/2022 26.2  % Final    Monocytes % 06/06/2022 8.2  % Final    Eosinophils % 06/06/2022 6.4  % Final    Basophils % 06/06/2022 0.4  % Final    Absolute Neutrophils 06/06/2022 1.0 (L)  1.8 - 7.8 10*9/L Final    Absolute Lymphocytes 06/06/2022 0.5 (L)  1.1 - 3.6 10*9/L Final    Absolute Monocytes 06/06/2022 0.1 (L)  0.3 - 0.8 10*9/L Final    Absolute Eosinophils 06/06/2022 0.1  0.0 - 0.5 10*9/L Final    Absolute Basophils 06/06/2022 0.0  0.0 - 0.1 10*9/L Final    Macrocytosis 06/06/2022 Slight (A)  Not Present Final    Anisocytosis 06/06/2022 Moderate (A)  Not Present Final    Blood Type 06/06/2022 O POS   Final    Antibody Screen 06/06/2022 NEG   Final       Allergies:   Allergies   Allergen Reactions    Erythromycin Hives     Other reaction(s): Not available    Other      Pt cant take ibuprofen due to condition.    Sulfa (Sulfonamide Antibiotics) Anaphylaxis     Other reaction(s): Not available    Azithromycin        Drug Interactions: none identified; dasatinib is a major 3A4 substrate, caution with 3A4 inh/ind; fluconazole max dose with VCR is 200 mg daily; avoid acid reducers with  dasatinib.       Current Medications:  Current Outpatient Medications   Medication Sig Dispense Refill    acetaminophen (TYLENOL 8 HOUR) 650 MG CR tablet Take 2 tablets (1,300 mg total) by mouth every eight (8) hours as needed for pain.      albuterol HFA 90 mcg/actuation inhaler Inhale 2 puffs every six (6) hours as needed for wheezing.      cetirizine (ZYRTEC) 10 MG tablet Take 1 tablet (10 mg total) by mouth nightly. 30 tablet 2    dapsone 100 MG tablet Take 1 tablet (100 mg total) by mouth daily. 30 tablet 2    dasatinib (SPRYCEL) 70 MG tablet Take 1 tablet (70 mg total) by mouth daily. 30 tablet 7    escitalopram oxalate (LEXAPRO) 10 MG tablet Take 1 tablet (10 mg total) by mouth daily.      fluconazole (DIFLUCAN) 200 MG tablet Take 1 tablet (200 mg total) by mouth daily.      hydrocortisone 2.5 % cream Apply topically two (2) times a day. Apply sparingly twice a day. 30 g 0    levalbuterol (XOPENEX) 0.31 mg/3 mL nebulizer solution Inhale 3 mL (0.31 mg total) by nebulization every four (4) hours as needed for wheezing (cough). 45 mL 0    oxyCODONE (ROXICODONE) 5 MG immediate release tablet Take 1-2 tablets (5-10 mg total) by mouth every four (4) hours as needed for pain      predniSONE (DELTASONE) 50 MG tablet Take 4 tablets (200 mg total) by mouth once daily on days 1-5 of each cycle. 20  tablet 5    valACYclovir (VALTREX) 500 MG tablet Take 1 tablet (500 mg total) by mouth daily. 90 tablet 3     No current facility-administered medications for this visit.       Adherence: No barriers identified/no missed doses.       Assessment: Ms.Mark is a 46 y.o. 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 Patient finished 5 cycles of hyperCVAD + rituximab + dasatinib and is now mid cycle 1 of VCR/dasatinib/prednisone maintenance. She is currently on dasatinib 70 mg daily. Tolerating therapy, however labs notable for counts down-trending overall (Hgb/PLT/ANC), as well as detectable CMV. Will continue very close monitoring with weekly labs and she continues to follow with ID team regarding CMV.     Plan:     Ph+ ALL  - Continue vincristine IV on day 1 of 28 day cycle (next planned for 06/27/22)  - Continue prednisone 200 mg (4 tablets) days 1-5 of 28 day cycle (completed for this cycle)  - Continue dasatinib 70 mg daily  - # 9 of 12 IT therapy planned 11/27 when she is here for next cycle  - Continue weekly labs for both CBC/diff (assess need for transfusions) and for CMV levels   - RTC 11/27 for C2D1 (including LP)    Infection ppx  -Continue dapsone 100 mg PO daily  -Continue valacyclovir 500 mg PO daily  -Can resume levofloxacin/fluconazole 200 mg only if ANC<0.5     CMV  -Weekly CMV levels per ICID, can continue for ~1 mo and if remains negative can consider stopping CMV monitoring    Supportive Care  - Continue Tylenol prn for pain. Check temperature before Tylenol dose and avoid taking NSAIDs.  - Can take Dulcolax prn for constipation  - Can take famotidine if having acid reflex from steroids. Space famotidine 2 hours after dasatinib dose.   - See gynecology on 12/19 to discuss contraception. She has been receiving 3-mo Lupron for menses suppression/fertility preservation while on chemotherapy and would be due for dose come ~12/7, however ideally would transition to contraception prior to that.     F/u:  Future Appointments   Date Time Provider Department Center   06/08/2022  1:30 PM [facility] LEUKEMIA PHARMACIST [id number] [place]   06/14/2022  8:30 AM Ortencia Kick, MD ONCMULTI [facility] [facility]   06/14/2022 11:00 AM ONCINF HMOB LABS HBHONC [facility] [facility]   06/14/2022 11:30 AM ONCINF HMOB CHAIR 18 ONCINF [facility] [facility]   06/20/2022  8:30 AM ONCINF HMOB LABS HBHONC [place] [facility]   06/20/2022  9:00 AM ONCINF HMOB CHAIR 18 ONCINF [facility]   06/27/2022  9:30 AM ADULT ONC LAB UNCCALAB [facility]   06/27/2022 10:30 AM Lenon Ahmadi, AGNP HON[facility] [place]   06/27/2022 11:00 AM ONCHEM LEUKEMIA PHARMACIST [facility] [place]   06/27/2022 11:30 AM ONCDEV CHAIR 54 HONC3UCA [facility]   06/27/2022  1:30 PM UNCW FLUORO RM 9 IFLUOUW [place]   06/30/2022  1:55 PM Idelia Salm, MD [facility]   07/19/2022 11:00 AM Micki Riley, MD [facility]       Ronnald Collum, PharmD, BCOP, CPP  Hematology/Oncology Clinical Pharmacist  Pager [phone]      The patient reports they are physically located in [place] and is currently: at home. I conducted a phone visit.  I spent 30 minutes on the phone call with the patient on the date of service .

## 2022-06-10 NOTE — Unmapped (Signed)
[facility] Refill Coordination Note    Specialty Medication(s) to be Shipped:   Hematology/Oncology: Sprycel 70mg     Other medication(s) to be shipped: No additional medications requested for fill at this time     Patricia [name], DOB: Dec 12, 1975  Phone: [phone] (home)       All above HIPAA information was verified with patient.     Was a Nurse, learning disability used for this call? No    Completed refill call assessment today to schedule patient's medication shipment from the [facility] [phone]).  All relevant notes have been reviewed.     Specialty medication(s) and dose(s) confirmed: Regimen is correct and unchanged.   Changes to medications:  Patricia [name] was just discharged from the hospital. While admitted, she is given the supply from inpatient.  Changes to insurance: No  New side effects reported not previously addressed with a pharmacist or physician: None reported  Questions for the pharmacist: No    Confirmed patient received a Conservation officer, historic buildings and a Surveyor, mining with first shipment. The patient will receive a drug information handout for each medication shipped and additional FDA Medication Guides as required.       DISEASE/MEDICATION-SPECIFIC INFORMATION        N/A    SPECIALTY MEDICATION ADHERENCE     Medication Adherence    Patient reported X missed doses in the last month: 1  Specialty Medication: Sprycel 70 mg  Patient is on additional specialty medications: No  Informant: patient                       Were doses missed due to medication being on hold? No    SpryceL 70 mg: 7 days of medicine on hand        REFERRAL TO PHARMACIST     Referral to the pharmacist: Not needed      [facility]     Shipping address confirmed in Epic.     Delivery Scheduled: Yes, Expected medication delivery date: 06/15/22.     Medication will be delivered via Next Day Courier to the prescription address in Epic [place].    Patricia [name]   [facility] Specialty Technician

## 2022-06-12 MED ORDER — DAPSONE 100 MG TABLET
ORAL_TABLET | Freq: Every day | ORAL | 11 refills | 30 days | Status: CP
Start: 2022-06-12 — End: ?

## 2022-06-13 NOTE — Unmapped (Signed)
OUTPATIENT ONCOLOGY PALLIATIVE CARE    Principal Diagnosis: Patricia [name] is a 46 y.o. female with newly diagnosed ALL.     Assessment/Plan:   #Fevers: Persistent. Multiple hospitalizations while neutropenic. Still having borderline fevers when not neutropenic. No associated infectious symptoms, except for loose stools. Responsive to tyelnol. Onoclogy aware.   - cont tylenol 650mg  TID PRN  - Discussed infectious signs and supported her in reaching out whenever concenred    #Blurred Vision: Newer symptom. Feels like it comes and goes. Hasn't seen eye doctor in years. Is a known side effect of methotrexate.   - Will message oncology given possible relation to methotrexate    #Loose Stools: Improved, but not resolved.   - Cont imodium PRN    #Anxiety/Coping Support: Somewhat worse. Lots of anxiety about recurrent hospitalization. Even reports having panic attack at home. She put herself back on prior prescription of lexapro (started 9/8)  - Cont lexapro 10mg  daily   - continue working with local therapist - been working with him since brother died    #Advanced Care Planning  - She and her husband have an excellent understanding of the current course of her treatment.  She recognizes that she is responding well and there is some uncertainty about how many rounds of treatment she will have to go through.  Has heard that the goal of treatment is curative, and she remains very hopeful for this.  Has been able to tolerate treatment fairly well to this point.  Her major concern is about side effects from lumbar punctures.  Current focus is on cancer treatment and survival while maintaining quality of life is much as possible.    Health Care Decision Maker as of 06/13/2022    HCDM (patient stated preference): [name] Spouse - [phone]      # Controlled substances risk management.   - Patient does not have a signed pain medication agreement with our team.  No controlled substances being prescribed by our team   - [place] database was reviewed today and it was appropriate.   - Urine drug screen was not performed at this visit. Findings: not applicable.   - Patient has received information about safe storage and administration of medications.   - Patient has not received a prescription for narcan; is not applicable.       F/u: 2 months    ----------------------------------------  Referring Provider: From inpatient palliative care team  Oncology Team: Malignant hematology  PCP: [name], MD    Interval Hx, 06/13/22:  Patient reports that she is doing okay, but that everything has been kind of overwhelming.     SHe is celebrating that she is currently in remission, but also shares that she is terrified about the next few treatments because she keeps ending up in the hospital with neutropenic fever. Worrying about this has even led to an anxiety attack at home. Because of that, patient put herself back on her old lexapro.     On ROS, she notes blurry vision, post-nasal drip, recurrent fevers, loose stools, and occasional vomiting when gag reflex triggered. Notes she has chills when fevers break.     Has had post-nasal drip most her life and reports that nasal sprays, neti-pots and mucinex have never been helpful.     No pain, dyspnea, nausea, constipation.     HPI: Ph+ ALL, currently in remission.    Current cancer-directed therapy: R-HyperCVAD/MA+dasatinib       Palliative Performance Scale: 90% - Ambulation:  Full / Normal Activity, some evidence of disease / Self-Care:Full / Intake: Normal / Level of Conscious: Full      Coping/Support Issues: See notes from Thornton Papas from recent inpatient admission for details.  They have multiple stressors but overall feels like she is coping fairly well right now.  She will reach out to our palliative care team if new concerns emerge.    Goals of Care:  Focus on cancer directed therapy and survival while hoping to maintain her quality of life is much as possible    Social History:   Name of primary support: Husband [name]  occupation: 6 grade teacher      Advance Care Planning:   HCPOA: Spouse  Natural surrogate decision maker: Husband Industrial/product designer  Living Will: no  ACP note:     Objective     Opioid Risk Tool:    Female  Female    Family history of substance abuse      Alcohol  1  3    Illegal drugs  2  3    Rx drugs  4  4    Personal history of substance abuse      Alcohol  3  3    Illegal drugs  4  4    Rx drugs  5  5    Age between 16--45 years  1  1    History of preadolescent sexual abuse  3  0    Psychological disease      ADD, OCD, bipolar, schizophrenia  2  2    Depression  1  1       Total: Low  (<3 low risk, 4-7 moderate risk, >8 high risk)    Oncology History Overview Note   Referring/Local Oncologist:    Diagnosis:   10/29/2021  Bone marrow, left iliac, aspiration and biopsy  -  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 The majority of the abnormal cells (64/88) contained an additional BCR/ABL1 fusion signal, while 9/88 abnormal cells contained an additional ABL1 and ASS1 signal.       Genetics:   Karyotype/FISH:   RESULTS   Date Value Ref Range Status   10/28/2021   Final    NOTE: This report reflects a combined study from a peripheral blood and a bone marrow core biopsy. Eleven cells from the peripheral blood and nine cells from the bone marrow core biopsy were analyzed. The BCR/ABL1 FISH analysis was performed on the peripheral blood.     Abnormal Karyotype:  47,XX,t(9;22)(q34;q11.2),+der(22)t(9;22)[18]/46,XX[2]    Abnormal FISH:  A BCR/ABL1 interphase FISH assay showed a signal pattern consistent with a BCR::ABL1 rearrangement and the 9;22 translocation in 88% of the 100 cells scored. The majority of the abnormal cells (64/88) contained an additional BCR/ABL1 fusion signal, while 9/88 abnormal cells contained an additional ABL1 and ASS1 signal.           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  MRD by flow insufficient, p190 2/100,000  12/10/2021: Cycle 2 B cycle + rituximab  12/14/21: IT#4  12/17/21: IT#5  01/10/22: Cycle 3 A cycle + rituximab  01/24/22: Bmbx - MRD-neg by PCR (p190)   02/17/22: C4 B cycle + ritux  02/18/22: IT#6  03/21/22: C5 A cycle + ritux (4th dose)  03/22/22: IT#7  04/29/22: Bmbx - MRD-neg by PCR (p190)   05/18/22: restart dasatinib 70mg  s/p neutropenia  05/26/22: Start maintenance vincristine 2g, Prednisone 200mg  D1-4   06/02/22: PLAN IT #8       B-cell acute lymphoblastic leukemia (ALL) (CMS-HCC)   10/29/2021 Initial Diagnosis    B-cell acute lymphoblastic leukemia (ALL) (CMS-HCC)     10/30/2021 - 03/30/2022 Chemotherapy    IP/OP LEUKEMIA GRAAPH-2005 < 60 YO (OP PEGFILGRASTIM ON DAY 7)  [No description for this plan]     01/06/2022 Endocrine/Hormone Therapy    OP LEUPROLIDE (LUPRON) 11.25 MG EVERY 3 MONTHS  Plan Provider: [name], MD     05/26/2022 -  Chemotherapy    OP LEUKEMIA VINCRISTINE  vinCRIStine 2 mg IV on day 1           REVIEW OF SYSTEMS:  A comprehensive review of 10 systems was negative except for pertinent positives noted in HPI.      PHYSICAL EXAM:   Video Visit  GEN: Awake and alert, pleasant appearing female in no acute distress  PSYCH: Alert and oriented to person, place and time. Euthymic.  LUNGS: No increased work of breathing.  SKIN: No rashes, petechiae or jaundice noted    {    Coding tips - Do not edit this text, it will delete upon signing of note!    Telephone visits [phone] for Physicians and APPs and [phone] for Non- Physician Clinicians)- Only use minutes on the phone to determine level of service.    Video visits [phone]) - Use either level of medical decision making just as an in-person visit OR time which includes both minutes on video and pre/post minutes to determine the level of service.      :[id number]}    The patient reports they are currently: at home. I spent 32 minutes on the real-time audio and video with the patient on the date of service. I spent an additional 15 minutes on pre- and post-visit activities on the date of service.     The patient was physically located in [place] or a state in which I am permitted to provide care. The patient and/or parent/guardian understood that s/he may incur co-pays and cost sharing, and agreed to the telemedicine visit. The visit was reasonable and appropriate under the circumstances given the patient's presentation at the time.    The patient and/or parent/guardian has been advised of the potential risks and limitations of this mode of treatment (including, but not limited to, the absence of in-person examination) and has agreed to be treated using telemedicine. The patient's/patient's family's questions regarding telemedicine have been answered.     If the visit was completed in an ambulatory setting, the patient and/or parent/guardian has also been advised to contact their provider???s office for worsening conditions, and seek emergency medical treatment and/or call 911 if the patient deems either necessary.        Redgie Grayer, MD  [facility] Out[facility] On[facility] Palliative Care service.     The patient was physically located in [place] or a state  in which I am permitted to provide care. The patient and/or parent/guardian understood that s/he may incur co-pays and cost sharing, and agreed to the telemedicine visit. The visit was reasonable and appropriate under the circumstances given the patient's presentation at the time.    The patient and/or parent/guardian has been advised of the potential risks and limitations of this mode of treatment (including, but not limited to, the absence of in-person examination) and has agreed to be treated using telemedicine. The patient's/patient's family's questions regarding telemedicine have been answered.     If the visit was completed in an ambulatory setting, the patient and/or parent/guardian has also been advised to contact their provider???s office for worsening conditions, and seek emergency medical treatment and/or call 911 if the patient deems either necessary.    Maeola Sarah, MD  [facility] Outpatient Oncology Palliative Care

## 2022-06-14 ENCOUNTER — Institutional Professional Consult (permissible substitution): Admit: 2022-06-14 | Discharge: 2022-06-14 | Payer: PRIVATE HEALTH INSURANCE

## 2022-06-14 ENCOUNTER — Ambulatory Visit: Admit: 2022-06-14 | Discharge: 2022-06-14 | Payer: PRIVATE HEALTH INSURANCE

## 2022-06-14 ENCOUNTER — Telehealth
Admit: 2022-06-14 | Discharge: 2022-06-14 | Payer: PRIVATE HEALTH INSURANCE | Attending: Student in an Organized Health Care Education/Training Program | Primary: Student in an Organized Health Care Education/Training Program

## 2022-06-14 DIAGNOSIS — R5081 Fever presenting with conditions classified elsewhere: Principal | ICD-10-CM

## 2022-06-14 DIAGNOSIS — B259 Cytomegaloviral disease, unspecified: Principal | ICD-10-CM

## 2022-06-14 DIAGNOSIS — H538 Other visual disturbances: Principal | ICD-10-CM

## 2022-06-14 DIAGNOSIS — D709 Neutropenia, unspecified: Principal | ICD-10-CM

## 2022-06-14 DIAGNOSIS — R197 Diarrhea, unspecified: Principal | ICD-10-CM

## 2022-06-14 DIAGNOSIS — C9100 Acute lymphoblastic leukemia not having achieved remission: Principal | ICD-10-CM

## 2022-06-14 DIAGNOSIS — Z515 Encounter for palliative care: Principal | ICD-10-CM

## 2022-06-14 DIAGNOSIS — F419 Anxiety disorder, unspecified: Principal | ICD-10-CM

## 2022-06-14 LAB — CBC W/ AUTO DIFF
BASOPHILS ABSOLUTE COUNT: 0 10*9/L (ref 0.0–0.1)
BASOPHILS RELATIVE PERCENT: 0.4 %
EOSINOPHILS ABSOLUTE COUNT: 0.1 10*9/L (ref 0.0–0.5)
EOSINOPHILS RELATIVE PERCENT: 2.3 %
HEMATOCRIT: 32.4 % — ABNORMAL LOW (ref 34.0–44.0)
HEMOGLOBIN: 11 g/dL — ABNORMAL LOW (ref 11.3–14.9)
LYMPHOCYTES ABSOLUTE COUNT: 1.1 10*9/L (ref 1.1–3.6)
LYMPHOCYTES RELATIVE PERCENT: 33.9 %
MEAN CORPUSCULAR HEMOGLOBIN CONC: 34 g/dL (ref 32.0–36.0)
MEAN CORPUSCULAR HEMOGLOBIN: 35.2 pg — ABNORMAL HIGH (ref 25.9–32.4)
MEAN CORPUSCULAR VOLUME: 103.5 fL — ABNORMAL HIGH (ref 77.6–95.7)
MEAN PLATELET VOLUME: 7 fL (ref 6.8–10.7)
MONOCYTES ABSOLUTE COUNT: 0.4 10*9/L (ref 0.3–0.8)
MONOCYTES RELATIVE PERCENT: 12 %
NEUTROPHILS ABSOLUTE COUNT: 1.7 10*9/L — ABNORMAL LOW (ref 1.8–7.8)
NEUTROPHILS RELATIVE PERCENT: 51.4 %
NUCLEATED RED BLOOD CELLS: 0 /100{WBCs} (ref <=4)
PLATELET COUNT: 100 10*9/L — ABNORMAL LOW (ref 150–450)
RED BLOOD CELL COUNT: 3.13 10*12/L — ABNORMAL LOW (ref 3.95–5.13)
RED CELL DISTRIBUTION WIDTH: 21.7 % — ABNORMAL HIGH (ref 12.2–15.2)
WBC ADJUSTED: 3.3 10*9/L — ABNORMAL LOW (ref 3.6–11.2)

## 2022-06-14 LAB — SLIDE REVIEW

## 2022-06-14 MED ADMIN — heparin, porcine (PF) 100 unit/mL injection 500 Units: 500 [IU] | INTRAVENOUS | @ 16:00:00 | Stop: 2022-06-14

## 2022-06-14 MED FILL — SPRYCEL 70 MG TABLET: ORAL | 30 days supply | Qty: 30 | Fill #3

## 2022-06-14 NOTE — Unmapped (Signed)
Patient at home and Provider in clinic today.   Meds and allergies reviewed.   Pharmacy confirmed

## 2022-06-14 NOTE — Unmapped (Signed)
Pt ambulated to infusion clinic.  Labs reviewed and found that patient does not need transfusion of platelets or blood today.   Port deaccessed and site covered with bandaid.  Pt ambulated independently at discharge.

## 2022-06-14 NOTE — Unmapped (Signed)
Addended by: Rico Ala on: 06/14/2022 09:33 AM     Modules accepted: Orders

## 2022-06-14 NOTE — Unmapped (Signed)
Clinical Support on 06/14/2022   Component Date Value Ref Range Status    WBC 06/14/2022 3.3 (L)  3.6 - 11.2 10*9/L Final    RBC 06/14/2022 3.13 (L)  3.95 - 5.13 10*12/L Final    HGB 06/14/2022 11.0 (L)  11.3 - 14.9 g/dL Final    HCT 16/05/9603 32.4 (L)  34.0 - 44.0 % Final    MCV 06/14/2022 103.5 (H)  77.6 - 95.7 fL Final    MCH 06/14/2022 35.2 (H)  25.9 - 32.4 pg Final    MCHC 06/14/2022 34.0  32.0 - 36.0 g/dL Final    RDW 54/04/8118 21.7 (H)  12.2 - 15.2 % Final    MPV 06/14/2022 7.0  6.8 - 10.7 fL Final    Platelet 06/14/2022 100 (L)  150 - 450 10*9/L Final    nRBC 06/14/2022 0  <=4 /100 WBCs Final    Neutrophils % 06/14/2022 51.4  % Final    Lymphocytes % 06/14/2022 33.9  % Final    Monocytes % 06/14/2022 12.0  % Final    Eosinophils % 06/14/2022 2.3  % Final    Basophils % 06/14/2022 0.4  % Final    Absolute Neutrophils 06/14/2022 1.7 (L)  1.8 - 7.8 10*9/L Final    Absolute Lymphocytes 06/14/2022 1.1  1.1 - 3.6 10*9/L Final    Absolute Monocytes 06/14/2022 0.4  0.3 - 0.8 10*9/L Final    Absolute Eosinophils 06/14/2022 0.1  0.0 - 0.5 10*9/L Final    Absolute Basophils 06/14/2022 0.0  0.0 - 0.1 10*9/L Final    Anisocytosis 06/14/2022 Marked (A)  Not Present Final

## 2022-06-15 LAB — CMV DNA, QUANTITATIVE, PCR
CMV QUANT LOG(10): 2.19 {Log_IU}/mL — ABNORMAL HIGH (ref <0.00)
CMV QUANT: 156 [IU]/mL — ABNORMAL HIGH (ref <0)
CMV VIRAL LD: DETECTED — AB

## 2022-06-20 NOTE — Unmapped (Signed)
called patient at [phone] and left a voicemail regarding her 0830 nurse visit appointment prior to possible transfusion.

## 2022-06-21 ENCOUNTER — Institutional Professional Consult (permissible substitution): Admit: 2022-06-21 | Discharge: 2022-06-22 | Payer: PRIVATE HEALTH INSURANCE

## 2022-06-21 DIAGNOSIS — C9100 Acute lymphoblastic leukemia not having achieved remission: Principal | ICD-10-CM

## 2022-06-21 LAB — CBC W/ AUTO DIFF
BASOPHILS ABSOLUTE COUNT: 0 10*9/L (ref 0.0–0.1)
BASOPHILS RELATIVE PERCENT: 0.6 %
EOSINOPHILS ABSOLUTE COUNT: 0.1 10*9/L (ref 0.0–0.5)
EOSINOPHILS RELATIVE PERCENT: 2.4 %
HEMATOCRIT: 27 % — ABNORMAL LOW (ref 34.0–44.0)
HEMOGLOBIN: 9.2 g/dL — ABNORMAL LOW (ref 11.3–14.9)
LYMPHOCYTES ABSOLUTE COUNT: 1.2 10*9/L (ref 1.1–3.6)
LYMPHOCYTES RELATIVE PERCENT: 53.9 %
MEAN CORPUSCULAR HEMOGLOBIN CONC: 34.1 g/dL (ref 32.0–36.0)
MEAN CORPUSCULAR HEMOGLOBIN: 35.4 pg — ABNORMAL HIGH (ref 25.9–32.4)
MEAN CORPUSCULAR VOLUME: 103.9 fL — ABNORMAL HIGH (ref 77.6–95.7)
MEAN PLATELET VOLUME: 7.1 fL (ref 6.8–10.7)
MONOCYTES ABSOLUTE COUNT: 0.3 10*9/L (ref 0.3–0.8)
MONOCYTES RELATIVE PERCENT: 15.4 %
NEUTROPHILS ABSOLUTE COUNT: 0.6 10*9/L — ABNORMAL LOW (ref 1.8–7.8)
NEUTROPHILS RELATIVE PERCENT: 27.7 %
NUCLEATED RED BLOOD CELLS: 0 /100{WBCs} (ref <=4)
PLATELET COUNT: 98 10*9/L — ABNORMAL LOW (ref 150–450)
RED BLOOD CELL COUNT: 2.6 10*12/L — ABNORMAL LOW (ref 3.95–5.13)
RED CELL DISTRIBUTION WIDTH: 20.9 % — ABNORMAL HIGH (ref 12.2–15.2)
WBC ADJUSTED: 2.2 10*9/L — ABNORMAL LOW (ref 3.6–11.2)

## 2022-06-21 LAB — COMPREHENSIVE METABOLIC PANEL
ALBUMIN: 3.7 g/dL (ref 3.4–5.0)
ALKALINE PHOSPHATASE: 116 U/L (ref 46–116)
ALT (SGPT): 115 U/L — ABNORMAL HIGH (ref 10–49)
ANION GAP: 7 mmol/L (ref 5–14)
AST (SGOT): 68 U/L — ABNORMAL HIGH (ref <=34)
BILIRUBIN TOTAL: 0.7 mg/dL (ref 0.3–1.2)
BLOOD UREA NITROGEN: 14 mg/dL (ref 9–23)
BUN / CREAT RATIO: 20
CALCIUM: 9.1 mg/dL (ref 8.7–10.4)
CHLORIDE: 109 mmol/L — ABNORMAL HIGH (ref 98–107)
CO2: 26 mmol/L (ref 20.0–31.0)
CREATININE: 0.7 mg/dL
EGFR CKD-EPI (2021) FEMALE: >90 mL/min/{1.73_m2} (ref >=60)
GLUCOSE RANDOM: 168 mg/dL (ref 70–179)
POTASSIUM: 3.7 mmol/L (ref 3.4–4.8)
PROTEIN TOTAL: 6 g/dL (ref 5.7–8.2)
SODIUM: 142 mmol/L (ref 135–145)

## 2022-06-21 MED ADMIN — heparin, porcine (PF) 100 unit/mL injection 500 Units: 500 [IU] | INTRAVENOUS | @ 14:00:00 | Stop: 2022-06-21

## 2022-06-21 NOTE — Unmapped (Deleted)
Patient arrived in the infusion clinic at ____.  Weight and Vitals were obtained. Port was accessed flushed with blood return, dressing clean, dry, and intact.  Labs were drawn and resulted within treatment plan parameters.  Patient was administered premedications at ____.  Patient was administered chemotherapy regiment as ordered without complications while in the clinic.  Port was flushed with blood return, then patient was hooked up to 46 hour continuous chemotherapy infusion via CADD pump.  Patient ______ after visit summary then discharged home to self care.

## 2022-06-21 NOTE — Unmapped (Signed)
Notified patient's team of 0.6 ANC at 0930 on 11/21//2023

## 2022-06-22 ENCOUNTER — Ambulatory Visit
Admit: 2022-06-22 | Discharge: 2022-06-23 | Payer: PRIVATE HEALTH INSURANCE | Attending: Infectious Disease | Primary: Infectious Disease

## 2022-06-22 DIAGNOSIS — D709 Neutropenia, unspecified: Principal | ICD-10-CM

## 2022-06-22 DIAGNOSIS — C9100 Acute lymphoblastic leukemia not having achieved remission: Principal | ICD-10-CM

## 2022-06-22 DIAGNOSIS — R5081 Fever presenting with conditions classified elsewhere: Principal | ICD-10-CM

## 2022-06-22 MED ORDER — AMOXICILLIN 875 MG-POTASSIUM CLAVULANATE 125 MG TABLET
ORAL_TABLET | Freq: Two times a day (BID) | ORAL | 0 refills | 28 days | Status: CP
Start: 2022-06-22 — End: 2022-07-20

## 2022-06-22 NOTE — Unmapped (Addendum)
Continue levofloxacin and fluconazole for now  2. If your skin rash on your chest doesn't go away we will get a derm biopsy  3.  If you continue to have fevers, please let me know and I will start augmentin  4. Please continue to use hydrocortisone cream on rash, it can take some time  5. If you get another fever, add Augmentin and continue levo and flucon, continue for 7 days

## 2022-06-22 NOTE — Unmapped (Addendum)
IMMUNOCOMPROMISED HOST INFECTIOUS DISEASE CONSULT NOTE    Patricia [name] is being seen in consultation at the request of [name] for evaluation of CMV viremia    Assessment/Recommendations:    Patricia [name] is a 46 y.o. 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 With low level of CMV and fevers during neutropenia, I am not convinced that CMV is the cause of the fevers. 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 She has no symptoms of CMV disease otherwise I might be more concerned about her CMV viremia. I believe that she has neutropenic fevers that can be managed with levo + flucon or Levo/Augmentin/Fluconazole as an outpatient given that she is asymptomatic and low risk at this time. If she continues to have fevers, would obtain CT chest and CT sinus to investigate possible fungal PNA. For now, we can continue to monitor CMV for the next month or so and then stop. No reason to treat CMV at this time.     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  -S/p 5 cycles GRAAPH-2005 + Dasatinib (treatment includes Rituximab)  -Now in MRD- CR1  -Receiving consolidation w Vincristine, pred, dasatinib    #Hemorrhoids + anal fissure 05/05/22  -resolved    Active Infections  #Fever of unknown origin 05/11/22  -10/11 Normal CT A/P, CT chest with mild diffuse bronchial wall thickening  -10/11 CT sinus with mild mucosal thickening   -Bld cx neg, urine culture w 10-50K E.faecalis, not treated  -CMV viremia as below  -S/p Cefepime 10/12-10/14, levo 10/14 x 5 days   Rx: Levo and fluconazole 11/21-->continue until no longer neutropenic    #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  -Asymptomatic     Antimicrobial allergies/intolerances: Erythromycin, Azithro, Sulfa       RECOMMENDATIONS    Diagnostic  Repeat CMV viral load today and Q week for now while she is having on and off fevers  RPP today  Blood cultures today  If her fevers continue or start up again, in 4-7 days she should undergo CT chest and CT sinus to evaluate for fungal PNA    Treatment  Augmentin if she develops fevers again and then continue for 1 week or until fevers resolve (Rx written)  Continue levo and fluconazole while neutropenic    Monitoring for antimicrobial toxicities  Patricia [name] is currently receiving drug therapy requiring intensive lab monitoring for toxicity.  NA    Prophylaxis  Valtrex and Dapsone  On levofloxacin and fluconazole while neutropenic  COVID, Flu vaccines    Follow up in 2-3 weeks           Recommendations were communicated via shared medical record.    History of Present Illness:      Source of information includes:  Electronic Medical Records and Discussion with patient      Since last clinic she received steroids and consolidation chemotherapy. Her CMV viral load increased again and she began to develop primarily low grade temps intermittently ~100.4 or less although on Saturday she was febrile to 101+. She has noticed some sinus drainage and cough that she associates with the season and mold in her house. No sinus pain, no chest pain. About a day ago she started levofloxacin and fluconazole because her ANC was low again and she didn't have fevers yesterday or today at all. Otherwise she denies any other symptoms. No new headaches, no sore throat, no abdominal pain, nausea, vomiting, or  diarrhea. She has not been around anyone sick at all although she is wondering what to do for Thanksgiving.       Allergies:  Allergies   Allergen Reactions    Erythromycin Hives     Other reaction(s): Not available    Other      Pt cant take ibuprofen due to condition.    Sulfa (Sulfonamide Antibiotics) Anaphylaxis     Other reaction(s): Not available    Azithromycin        Medications:     Current Outpatient Medications:     acetaminophen (TYLENOL 8 HOUR) 650 MG CR tablet, Take 2 tablets (1,300 mg total) by mouth every eight (8) hours as needed for pain., Disp: , Rfl:     albuterol HFA 90 mcg/actuation inhaler, Inhale 2 puffs every six (6) hours as needed for wheezing., Disp: , Rfl:     cetirizine (ZYRTEC) 10 MG tablet, Take 1 tablet (10 mg total) by mouth nightly., Disp: 30 tablet, Rfl: 2    dapsone 100 MG tablet, TAKE 1 TABLET(100 MG) BY MOUTH DAILY, Disp: 30 tablet, Rfl: 11    dasatinib (SPRYCEL) 70 MG tablet, Take 1 tablet (70 mg total) by mouth daily., Disp: 30 tablet, Rfl: 7    escitalopram oxalate (LEXAPRO) 10 MG tablet, Take 1 tablet (10 mg total) by mouth daily., Disp: , Rfl:     fluconazole (DIFLUCAN) 200 MG tablet, Take 1 tablet (200 mg total) by mouth daily., Disp: , Rfl:     hydrocortisone 2.5 % cream, Apply topically two (2) times a day. Apply sparingly twice a day., Disp: 30 g, Rfl: 0    levalbuterol (XOPENEX) 0.31 mg/3 mL nebulizer solution, Inhale 3 mL (0.31 mg total) by nebulization every four (4) hours as needed for wheezing (cough)., Disp: 45 mL, Rfl: 0    oxyCODONE (ROXICODONE) 5 MG immediate release tablet, Take 1-2 tablets (5-10 mg total) by mouth every four (4) hours as needed for pain, Disp: , Rfl:     predniSONE (DELTASONE) 50 MG tablet, Take 4 tablets (200 mg total) by mouth once daily on days 1-5 of each cycle., Disp: 20 tablet, Rfl: 5    valACYclovir (VALTREX) 500 MG tablet, Take 1 tablet (500 mg total) by mouth daily., Disp: 90 tablet, Rfl: 3    amoxicillin-clavulanate (AUGMENTIN) 875-125 mg per tablet, Take 1 tablet by mouth two (2) times a day for 28 days., Disp: 56 tablet, Rfl: 0    Current antibiotics:  Valtrex and Dapsone ppx  Levo and Flucon while neutropenic    Previous antibiotics:  Levo, Cefepime  Flucon while neutropenic    Current/Prior immunomodulators:  None    Other medications reviewed.     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 Her smoking use included cigarettes. She has never used smokeless tobacco.   Alcohol use:    reports that she does not currently use alcohol.   Drug use:    reports  no history of drug use.   Living situation:  Lives with spouse/partner   [place]     Family History:  No family history on file.    Review of Systems:  All other systems reviewed are negative.     Objective     Vital Signs:  BP 114/87  - Temp 36.4 ??C (97.6 ??F)  - SpO2 98%     Physical Exam:     Const [x]  vital signs above    [x]  NAD, non-toxic appearance []  Chronically ill-appearing, non-distressed        Eyes [x]  Lids normal bilaterally, conjunctiva anicteric and noninjected OU     [] PERRL  [] EOMI        ENMT [x]  Normal appearance of external nose and ears, no nasal discharge        []  MMM, no lesions on lips or gums []  No thrush, leukoplakia, oral lesions  []  Dentition good []  Edentulous []  Dental caries present  []  Hearing normal  []  TMs with good light reflexes bilaterally         Neck [x]  Neck of normal appearance and trachea midline        []  No thyromegaly, nodules, or tenderness   []  Full neck ROM        Lymph [x]  No LAD in neck     [x]  No LAD in supraclavicular area     []  No LAD in axillae   []  No LAD in epitrochlear chains     []  No LAD in inguinal areas        CV [x]  RRR            [x]  No peripheral edema     []  Pedal pulses intact   [x]  No abnormal heart sounds appreciated   [x]  Extremities WWP         Resp [x]  Normal WOB at rest    [x]  No breathlessness with speaking, no coughing  [x]  CTA anteriorly    [x]  CTA posteriorly          GI [x]  Normal inspection, NTND   [x]  NABS     [x]  No umbilical hernia on exam       [x]  No hepatosplenomegaly     []  Inspection of perineal and perianal areas normal        GU []  Normal external genitalia     [] No urinary catheter present in urethra   []  No CVA tenderness    []  No tenderness over renal allograft        MSK [x]  No clubbing or cyanosis of hands       []  No vertebral point tenderness  []  No focal tenderness or abnormalities on palpation of joints in RUE, LUE, RLE, or LLE        Skin []  No rashes, lesions, or ulcers of visualized skin     [x]  Skin warm and dry to palpation   Erythematous scaly patch on R upper chest ~1.5cm      Neuro [x]  Face expression symmetric  []  Sensation to light touch grossly intact throughout    []  Moves extremities equally    [x]  No tremor noted        []  CNs II-XII grossly intact     []  DTRs normal and symmetric throughout []  Gait unremarkable        Psych [x]  Appropriate affect       [x]  Fluent speech         [  x] Attentive, good eye contact  []  Oriented to person, place, time          [x]  Judgment and insight are appropriate             Labs:  Results in Past 30 Days  Result Component Current Result Ref Range Previous Result Ref Range   Absolute Eosinophils 0.1 (06/21/2022) 0.0 - 0.5 10*9/L 0.1 (06/14/2022) 0.0 - 0.5 10*9/L   Absolute Lymphocytes 1.2 (06/21/2022) 1.1 - 3.6 10*9/L 1.1 (06/14/2022) 1.1 - 3.6 10*9/L   Absolute Neutrophils 0.6 (L) (06/21/2022) 1.8 - 7.8 10*9/L 1.7 (L) (06/14/2022) 1.8 - 7.8 10*9/L   Alkaline Phosphatase 116 (06/21/2022) 46 - 116 U/L 79 (05/26/2022) 46 - 116 U/L   ALT 115 (H) (06/21/2022) 10 - 49 U/L 81 (H) (05/26/2022) 10 - 49 U/L   AST 68 (H) (06/21/2022) <=34 U/L 54 (H) (05/26/2022) <=34 U/L   BUN 14 (06/21/2022) 9 - 23 mg/dL 13 (16/08/958) 9 - 23 mg/dL   Calcium 9.1 (45/40/9811) 8.7 - 10.4 mg/dL 9.2 (91/10/7827) 8.7 - 56.2 mg/dL   Creatinine 1.30 (86/57/8469) 0.55 - 1.02 mg/dL 6.29 (52/03/4131) 4.40 - 1.02 mg/dL   HGB 9.2 (L) (05/28/2535) 11.3 - 14.9 g/dL 64.4 (L) (03/47/4259) 56.3 - 14.9 g/dL   Magnesium 2.0 (87/11/6431) 1.6 - 2.6 mg/dL Not in Time Range    Phosphorus 3.6 (06/06/2022) 2.4 - 5.1 mg/dL Not in Time Range    Platelet 98 (L) (06/21/2022) 150 - 450 10*9/L 100 (L) (06/14/2022) 150 - 450 10*9/L   Potassium 3.7 (06/21/2022) 3.4 - 4.8 mmol/L 3.9 (06/06/2022) 3.4 - 4.8 mmol/L   Total Bilirubin 0.7 (06/21/2022) 0.3 - 1.2 mg/dL 0.7 (29/51/8841) 0.3 - 1.2 mg/dL   WBC 2.2 (L) (66/12/3014) 3.6 - 11.2 10*9/L 3.3 (L) (06/14/2022) 3.6 - 11.2 10*9/L     Labs notable for AST and ALT elevated from prior, cr normal, WBC count low and ANC 600    Microbiology:  Past cultures were reviewed in Epic and CareEverywhere.    Imaging:  CT chest 05/11/22 Bronchial wall thickening, otherwise normal  Independent visualization of images: I independently reviewed the image from 05/11/22 and I agree with the findings/interpretation.    Serologies:  Lab Results   Component Value Date    Hep B Surface Ag Nonreactive 05/18/2022    Hep B S Ab Reactive (A) 05/18/2022    Hep B Surf Ab Quant 101.02 (H) 05/18/2022    Hep B Core Total Ab Nonreactive 05/18/2022    Hepatitis C Ab Nonreactive 10/28/2021       Immunizations:  Immunization History   Administered Date(s) Administered    COVID-19 VACC,MRNA,(PFIZER)(PF) 02/20/2020, 03/12/2020       Time spent on counseling/coordination of care: 20 Minutes  Total time spent with patient: 45 Minutes

## 2022-06-23 LAB — CMV DNA, QUANTITATIVE, PCR
CMV QUANT LOG(10): 2.36 {Log_IU}/mL — ABNORMAL HIGH (ref <0.00)
CMV QUANT: 230 [IU]/mL — ABNORMAL HIGH (ref <0)
CMV VIRAL LD: DETECTED — AB

## 2022-06-27 ENCOUNTER — Ambulatory Visit: Admit: 2022-06-27 | Discharge: 2022-06-27 | Payer: PRIVATE HEALTH INSURANCE

## 2022-06-27 ENCOUNTER — Ambulatory Visit
Admit: 2022-06-27 | Discharge: 2022-06-27 | Payer: PRIVATE HEALTH INSURANCE | Attending: Adult Health | Primary: Adult Health

## 2022-06-27 ENCOUNTER — Other Ambulatory Visit: Admit: 2022-06-27 | Discharge: 2022-06-27 | Payer: PRIVATE HEALTH INSURANCE

## 2022-06-27 DIAGNOSIS — R509 Fever, unspecified: Principal | ICD-10-CM

## 2022-06-27 DIAGNOSIS — B259 Cytomegaloviral disease, unspecified: Principal | ICD-10-CM

## 2022-06-27 DIAGNOSIS — C9100 Acute lymphoblastic leukemia not having achieved remission: Principal | ICD-10-CM

## 2022-06-27 LAB — CBC W/ AUTO DIFF
BASOPHILS ABSOLUTE COUNT: 0 10*9/L (ref 0.0–0.1)
BASOPHILS RELATIVE PERCENT: 0.3 %
EOSINOPHILS ABSOLUTE COUNT: 0 10*9/L (ref 0.0–0.5)
EOSINOPHILS RELATIVE PERCENT: 1.3 %
HEMATOCRIT: 26.1 % — ABNORMAL LOW (ref 34.0–44.0)
HEMOGLOBIN: 9 g/dL — ABNORMAL LOW (ref 11.3–14.9)
LYMPHOCYTES ABSOLUTE COUNT: 1.2 10*9/L (ref 1.1–3.6)
LYMPHOCYTES RELATIVE PERCENT: 51.3 %
MEAN CORPUSCULAR HEMOGLOBIN CONC: 34.6 g/dL (ref 32.0–36.0)
MEAN CORPUSCULAR HEMOGLOBIN: 36.3 pg — ABNORMAL HIGH (ref 25.9–32.4)
MEAN CORPUSCULAR VOLUME: 104.7 fL — ABNORMAL HIGH (ref 77.6–95.7)
MEAN PLATELET VOLUME: 6.8 fL (ref 6.8–10.7)
MONOCYTES ABSOLUTE COUNT: 0.5 10*9/L (ref 0.3–0.8)
MONOCYTES RELATIVE PERCENT: 21.8 %
NEUTROPHILS ABSOLUTE COUNT: 0.6 10*9/L — ABNORMAL LOW (ref 1.8–7.8)
NEUTROPHILS RELATIVE PERCENT: 25.3 %
PLATELET COUNT: 90 10*9/L — ABNORMAL LOW (ref 150–450)
RED BLOOD CELL COUNT: 2.49 10*12/L — ABNORMAL LOW (ref 3.95–5.13)
RED CELL DISTRIBUTION WIDTH: 21.1 % — ABNORMAL HIGH (ref 12.2–15.2)
WBC ADJUSTED: 2.3 10*9/L — ABNORMAL LOW (ref 3.6–11.2)

## 2022-06-27 LAB — COMPREHENSIVE METABOLIC PANEL
ALBUMIN: 3.8 g/dL (ref 3.4–5.0)
ALKALINE PHOSPHATASE: 118 U/L — ABNORMAL HIGH (ref 46–116)
ALT (SGPT): 84 U/L — ABNORMAL HIGH (ref 10–49)
ANION GAP: 7 mmol/L (ref 5–14)
AST (SGOT): 55 U/L — ABNORMAL HIGH (ref <=34)
BILIRUBIN TOTAL: 0.5 mg/dL (ref 0.3–1.2)
BLOOD UREA NITROGEN: 11 mg/dL (ref 9–23)
BUN / CREAT RATIO: 17
CALCIUM: 8.6 mg/dL — ABNORMAL LOW (ref 8.7–10.4)
CHLORIDE: 107 mmol/L (ref 98–107)
CO2: 26 mmol/L (ref 20.0–31.0)
CREATININE: 0.64 mg/dL
EGFR CKD-EPI (2021) FEMALE: >90 mL/min/{1.73_m2} (ref >=60)
GLUCOSE RANDOM: 148 mg/dL (ref 70–179)
POTASSIUM: 3.7 mmol/L (ref 3.4–4.8)
PROTEIN TOTAL: 6.2 g/dL (ref 5.7–8.2)
SODIUM: 140 mmol/L (ref 135–145)

## 2022-06-27 LAB — HEMATOPATHOLOGY LEUKEMIA/LYMPHOMA FLOW CYTOMETRY, CSF
LYMPHS CSF: 47 %
MONO/MACROPHAGE CSF: 50 %
NEUTROPHILS, CSF: 3 %
NUCLEATED CELLS, CSF: 2 ul (ref 0–5)
NUMBER OF CELLS CSF: 100
RBC CSF: 288 ul — ABNORMAL HIGH

## 2022-06-27 LAB — LYMPH MARKER LIMITED,FLOW
ABSOLUTE CD3 CNT: 1128 {cells}/uL (ref 915–3400)
ABSOLUTE CD4 CNT: 276 {cells}/uL — ABNORMAL LOW (ref 510–2320)
ABSOLUTE CD8 CNT: 792 {cells}/uL (ref 180–1520)
CD3% (T CELLS): 94 % — ABNORMAL HIGH (ref 61–86)
CD4% (T HELPER): 23 % — ABNORMAL LOW (ref 34–58)
CD4:CD8 RATIO: 0.3 — ABNORMAL LOW (ref 0.9–4.8)
CD8% T SUPPRESR: 66 % — ABNORMAL HIGH (ref 12–38)

## 2022-06-27 LAB — IGG: GAMMAGLOBULIN; IGG: 237 mg/dL — ABNORMAL LOW (ref 650–1600)

## 2022-06-27 MED ORDER — TRIAMCINOLONE ACETONIDE 0.1 % TOPICAL CREAM
Freq: Two times a day (BID) | TOPICAL | 0 refills | 14 days | Status: CP
Start: 2022-06-27 — End: 2022-07-11

## 2022-06-27 MED ADMIN — LORazepam (ATIVAN) injection 1 mg: 1 mg | INTRAVENOUS | @ 20:00:00 | Stop: 2022-06-27

## 2022-06-27 MED ADMIN — heparin, porcine (PF) 100 unit/mL injection 500 Units: 500 [IU] | INTRAVENOUS | @ 23:00:00 | Stop: 2022-06-28

## 2022-06-27 MED ADMIN — sodium chloride (NS) 0.9 % infusion: 100 mL/h | INTRAVENOUS | @ 23:00:00

## 2022-06-27 MED ADMIN — lidocaine (XYLOCAINE) 10 mg/mL (1 %) injection 10 mL: 10 mL | @ 21:00:00 | Stop: 2022-06-27

## 2022-06-27 MED ADMIN — vinCRIStine (ONCOVIN) 2 mg in sodium chloride (NS) 0.9 % 25 mL IVPB: 2 mg | INTRAVENOUS | @ 23:00:00 | Stop: 2022-06-27

## 2022-06-27 NOTE — Unmapped (Signed)
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 Reports she has had more low-grade fevers, highest being 101.7 which was on Saturday. She has been taking Augmentin since Friday per Dr. Christene Lye recommendations and will complete a 7 day course of this. Still taking Tylenol as needed for fevers. No new symptoms at all outside of maybe some irritation where she got her wisdom teeth extracted. Hass stayed on dasatinib without missed doses. Also has been taking levofloxacin and fluconazole for ppx. We reviewed start of a new maintenance cycle and she will receive vincristine today, continue dasatinib, restart steroids (we reviewed dosing), and LP today as well. Set to follow up with Gyn in [place]. Also discussed CMV levels and plan/recommendations from ID team regarding this. She is more comfortable with frequent labs so she can monitor ANC and be able to assess infection risk but she is mostly staying home and being careful. Will continue with weekly labs for time being at [facility].      Labs: WBC 2.3, ANC 0.6, Hgb 9.0, PLT 90, CMP notable for AST 55, ALT 84; CMV viral load 492; Total lgG 237; absolute CD4 count 276    Oncologic History:  Oncology History Overview Note   Referring/Local Oncologist:    Diagnosis:   10/29/2021  Bone marrow, left iliac, aspiration and biopsy  -  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 The majority of the abnormal cells (64/88) contained an additional BCR/ABL1 fusion signal, while 9/88 abnormal cells contained an additional ABL1 and ASS1 signal.       Genetics:   Karyotype/FISH:   RESULTS   Date Value Ref Range Status   10/28/2021   Final    NOTE: This report reflects a combined study from a peripheral blood and a bone marrow core biopsy. Eleven cells from the peripheral blood and nine cells from the bone marrow core biopsy were analyzed. The BCR/ABL1 FISH analysis was performed on the peripheral blood.     Abnormal Karyotype:  47,XX,t(9;22)(q34;q11.2),+der(22)t(9;22)[18]/46,XX[2]    Abnormal FISH:  A BCR/ABL1 interphase FISH assay showed a signal pattern consistent with a BCR::ABL1 rearrangement and the 9;22 translocation in 88% of the 100 cells scored. The majority of the abnormal cells (64/88) contained an additional BCR/ABL1 fusion signal, while 9/88 abnormal cells contained an additional ABL1 and ASS1 signal.           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 Initial Diagnosis    B-cell acute lymphoblastic leukemia (ALL) (CMS-HCC)     10/30/2021 - 03/30/2022 Chemotherapy    IP/OP LEUKEMIA GRAAPH-2005 < 60 YO (OP PEGFILGRASTIM ON DAY 7)  [No description for this plan]     01/06/2022 Endocrine/Hormone Therapy    OP LEUPROLIDE (LUPRON) 11.25 MG EVERY 3 MONTHS  Plan Provider: [name], MD     05/26/2022 -  Chemotherapy    OP LEUKEMIA VINCRISTINE  vinCRIStine 2 mg IV on day 1         Weight and Vitals:  Wt Readings from Last 3 Encounters:   06/27/22 82.5 kg (181 lb 14.1 oz)   06/07/22 82.9 kg (182 lb 12.2 oz)   05/26/22 83 kg (182 lb 15.7 oz)     Temp Readings from Last 3 Encounters:   06/27/22 37.2 ??C (98.9 ??F) (Oral)   06/22/22 36.4 ??C (97.6 ??F)   06/07/22 37 ??C (98.6 ??F) (Temporal)     BP Readings from Last 3 Encounters:   06/27/22 113/62   06/22/22 114/87   06/07/22 120/77     Pulse Readings from Last 3 Encounters:   06/27/22 115   06/07/22 89   06/02/22 98       Pertinent Labs:  Lab on 06/27/2022   Component Date Value Ref Range Status    Sodium 06/27/2022 140  135 - 145 mmol/L Final    Potassium 06/27/2022 3.7  3.4 - 4.8 mmol/L Final    Chloride 06/27/2022 107  98 - 107 mmol/L Final    CO2 06/27/2022 26.0  20.0 - 31.0 mmol/L Final    Anion Gap 06/27/2022 7  5 - 14 mmol/L Final    BUN 06/27/2022 11  9 - 23 mg/dL Final    Creatinine 16/05/9603 0.64  0.55 - 1.02 mg/dL Final    BUN/Creatinine Ratio 06/27/2022 17   Final    eGFR CKD-EPI (2021) Female 06/27/2022 >90  >=60 mL/min/1.29m2 Final    eGFR calculated with CKD-EPI 2021 equation in accordance with [employer] and [employer] of Nephrology Task Force recommendations.    Glucose 06/27/2022 148  70 - 179 mg/dL Final    Calcium 54/04/8118 8.6 (L)  8.7 - 10.4 mg/dL Final    Albumin 14/78/2956 3.8  3.4 - 5.0 g/dL Final    Total Protein 06/27/2022 6.2  5.7 - 8.2 g/dL Final    Total Bilirubin 06/27/2022 0.5  0.3 - 1.2 mg/dL Final    AST 21/30/8657 55 (H)  <=34 U/L Final    ALT 06/27/2022 84 (H)  10 - 49 U/L Final    Alkaline Phosphatase 06/27/2022 118 (H)  46 - 116 U/L Final    WBC 06/27/2022 2.3 (L)  3.6 - 11.2 10*9/L Final    RBC 06/27/2022 2.49 (L)  3.95 - 5.13 10*12/L Final    HGB 06/27/2022 9.0 (L)  11.3 - 14.9 g/dL Final    HCT 84/69/6295 26.1 (L)  34.0 - 44.0 % Final  MCV 06/27/2022 104.7 (H)  77.6 - 95.7 fL Final    MCH 06/27/2022 36.3 (H)  25.9 - 32.4 pg Final    MCHC 06/27/2022 34.6  32.0 - 36.0 g/dL Final    RDW 29/56/2130 21.1 (H)  12.2 - 15.2 % Final    MPV 06/27/2022 6.8  6.8 - 10.7 fL Final    Platelet 06/27/2022 90 (L)  150 - 450 10*9/L Final    Neutrophils % 06/27/2022 25.3  % Final    Lymphocytes % 06/27/2022 51.3  % Final    Monocytes % 06/27/2022 21.8  % Final    Eosinophils % 06/27/2022 1.3  % Final    Basophils % 06/27/2022 0.3  % Final    Absolute Neutrophils 06/27/2022 0.6 (L)  1.8 - 7.8 10*9/L Final    Absolute Lymphocytes 06/27/2022 1.2  1.1 - 3.6 10*9/L Final    Absolute Monocytes 06/27/2022 0.5  0.3 - 0.8 10*9/L Final    Absolute Eosinophils 06/27/2022 0.0  0.0 - 0.5 10*9/L Final    Absolute Basophils 06/27/2022 0.0  0.0 - 0.1 10*9/L Final    Anisocytosis 06/27/2022 Moderate (A)  Not Present Final       Allergies:   Allergies   Allergen Reactions    Erythromycin Hives     Other reaction(s): Not available    Other      Pt cant take ibuprofen due to condition.    Sulfa (Sulfonamide Antibiotics) Anaphylaxis     Other reaction(s): Not available    Azithromycin        Drug Interactions: none identified; dasatinib is a major 3A4 substrate, caution with 3A4 inh/ind; fluconazole max dose with VCR is 200 mg daily; avoid acid reducers with  dasatinib.       Current Medications:  Current Outpatient Medications   Medication Sig Dispense Refill    acetaminophen (TYLENOL 8 HOUR) 650 MG CR tablet Take 2 tablets (1,300 mg total) by mouth every eight (8) hours as needed for pain.      albuterol HFA 90 mcg/actuation inhaler Inhale 2 puffs every six (6) hours as needed for wheezing. amoxicillin-clavulanate (AUGMENTIN) 875-125 mg per tablet Take 1 tablet by mouth two (2) times a day for 28 days. 56 tablet 0    cetirizine (ZYRTEC) 10 MG tablet Take 1 tablet (10 mg total) by mouth nightly. 30 tablet 2    dapsone 100 MG tablet TAKE 1 TABLET(100 MG) BY MOUTH DAILY 30 tablet 11    dasatinib (SPRYCEL) 70 MG tablet Take 1 tablet (70 mg total) by mouth daily. 30 tablet 7    escitalopram oxalate (LEXAPRO) 10 MG tablet Take 1 tablet (10 mg total) by mouth daily.      fluconazole (DIFLUCAN) 200 MG tablet Take 1 tablet (200 mg total) by mouth daily.      hydrocortisone 2.5 % cream Apply topically two (2) times a day. Apply sparingly twice a day. (Patient not taking: Reported on 06/27/2022) 30 g 0    levalbuterol (XOPENEX) 0.31 mg/3 mL nebulizer solution Inhale 3 mL (0.31 mg total) by nebulization every four (4) hours as needed for wheezing (cough). (Patient not taking: Reported on 06/27/2022) 45 mL 0    oxyCODONE (ROXICODONE) 5 MG immediate release tablet Take 1-2 tablets (5-10 mg total) by mouth every four (4) hours as needed for pain (Patient not taking: Reported on 06/27/2022)      predniSONE (DELTASONE) 50 MG tablet Take 4 tablets (200 mg total) by mouth once daily on days  1-5 of each cycle. (Patient not taking: Reported on 06/27/2022) 20 tablet 5    valACYclovir (VALTREX) 500 MG tablet Take 1 tablet (500 mg total) by mouth daily. 90 tablet 3     No current facility-administered medications for this visit.       Adherence: No barriers identified/no missed doses.       Assessment: Ms.Kishi is a 46 y.o. 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 Patient finished 5 cycles of hyperCVAD + rituximab + dasatinib before transitioning to VCR/dasatinib/prednisone maintenance. She is currently on dasatinib 70 mg daily. Tolerating therapy, however labs notable for fluctuating counts overall (Hgb/PLT/ANC), as well as detectable CMV that has also been fluctuating. She continues to have low-grade fevers. Will continue very close monitoring with weekly labs and she continues to follow with ID team regarding CMV.     Plan:     Ph+ ALL  - Continue vincristine IV on day 1 of 28 day cycle (due today)  - Continue prednisone 200 mg (4 tablets) days 1-5 of 28 day cycle (will restart today)  - Continue dasatinib 70 mg daily  - # 9 of 12 IT therapy planned for today; will schedule next at next visit if possible  - Continue weekly labs for both CBC/diff (assess need for transfusions) and for CMV levels   - Plan for CPP phone call after next set of weekly labs (labs on 12/4), CPP phone call on 12/5. RTC in 4 weeks prior to next cycle.     Infection   -Continue dapsone 100 mg PO daily for PJP ppx  -Continue valacyclovir 500 mg PO daily  -Continue levofloxacin/fluconazole 200 mg given ANC ~0.6 and may trend down. Stop if ANC improving on weekly labs.   -Weekly CMV levels per ICID team. Will follow up with ID regarding recommendations on threshold to treat (vs prophylaxis of CMV).   -START IVIG every 4 weeks - therapy plan in place, should start with first dose at next clinic visit in 4 weeks    Supportive Care  - Continue Tylenol prn for pain. Check temperature before Tylenol dose and avoid taking NSAIDs.  - Can take Dulcolax prn for constipation  - Can take famotidine if having acid reflex from steroids. Space famotidine 2 hours after dasatinib dose.   - See gynecology on 12/19 to discuss contraception. She has been receiving 3-mo Lupron for menses suppression/fertility preservation while on chemotherapy and would be due for dose come ~12/7, however ideally would transition to contraception prior to that.     F/u:  Future Appointments   Date Time Provider Department Center   06/27/2022  1:30 PM [facility] 9 IFLUOU[facility] [place]   06/30/2022  1:55 PM Idelia Salm, MD [facility]   07/19/2022 11:00 AM Micki Riley, MD [facility]   07/26/2022 11:00 AM ONCINF CHAIR 26 [id number]   08/09/2022 11:00 AM Ortencia Kick, MD [facility]       Ronnald Collum, PharmD, BCOP, CPP  Hematology/Oncology Clinical Pharmacist  Pager [phone]

## 2022-06-27 NOTE — Unmapped (Signed)
Great to see you today!  -Prednisone 4 tablets (200 mg total) by mouth once daily starting today for 5 days. Please pick this up from [facility] today.   -Vincristine in clinic today  -Lumbar puncture today  -Continue dasatinib 70 mg (1 tablet) daily  -Weekly labs at [facility] to continue levofloxacin and fluconazole currently  -Complete Augmentin course  -We will follow up on your CMV level from today

## 2022-06-27 NOTE — Unmapped (Addendum)
[facility] Follow Up Visit Note     [name]  Patient Age: 46 y.o.  Encounter Date: 06/27/2022    Primary Care Provider:  [name], MD    Referring Physician:  [name], MD  [address]  [place],  [place] [address]          Cancer Diagnosis: Ph+ B-ALL; Initial Dx 10/29/2021  Cancer status: CR1, BCR/ABL p190 negative in marrow  Treatment Regimen: First Line, Maintenance (Dasatinib, vincristine, prednisone) s/p truncated course of GRAAPH-2005, HyperCVAD  Treatment Goal: Curative  Comorbidities: HTN, anxiety, hemorrhoids with anal fissures      Assessment/Plan:  MARLESE [name] is a 46 y.o. female with past medical history of chronic right shoulder and back pain, hypertension, anxiety, and recently diagnosed Ph+ ALL. 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  Given her deep molecular remission and her intolerance of hyperCVAD, we are transitioned to maintenance chemotherapy.     She is now Day 2 Cycle 1 Maintenance. She feels well but her course has been complicated by moderate neutropenia (ANC 0.6 today), low-grade fevers, low-level CMV.  We are collaborating with Dr. Kari Baars, ICID, regarding CMV and monitoring for now. Fevers have not changed with the current course of Augmentin. Will obtain CT imaging of the chest and sinuses to evaluate potential sources of infection. Will collect Limited Lymphocyte Markers today and may consider IVIG pending result. Since she relatively stable, we will continue Maintenance without changes with goal of maintaining control of the leukemia.  Grade 1 Peripheral Neuropathy is stable.  Follow up IgG level, consider IVIG    Ph+ALL, in remission: On GRAAPH 2005 + dasatinib.   - Cycle 2 Maintenance  - Dasatinib 70 mg, VCR, pred 100 D1-5   - Plan to continue IT chemo with goal of 12     - #9 today  - q 3 month BCR/ABL - due 07/2022 - collected today  - q 6 month bmbx - next - due 09/2021  - plan blina if ALL progresses  - scheduled orders signed through C5    Persistent Low Grade Fevers  - Ordered CT Chest/Sinuses  - Complete course of Augmentin (Day 4 today)  - monitor CMV    Hemorrhoids, Anal fissure: persistent but stable, per patient 06/27/2022  - supportive care at home    CMV:  - weekly CMV levels  - Dr. 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    Pancytopenia due to chemotherapy  - ANC 0.6, platelets 90 06/27/2022  - supportive care as detailed below when in nadir    Menses suppression and birth control: Lupron  - most recent dose 04/07/22  - next due 07/07/22 - discussed options and she prefers to continue Lupron. Will need birth control while on chemo, including when on only TKI -     Psychosocial distress: She reports a moderate level of anxiety regarding the management of the above.   - Counseling given   - Consider ref to comprehensive cancer support program     Patient-centered care/Shared decision-making:   We discussed the plan above at length. The patient and her husband actively contributed to the conversation. Specifically, her most important outcomes are:   Prolonging overall survival and Maintaining her overall functional activity     Supportive Care Needs: We recommend based on the patient???s underlying diagnosis and treatment history the following  supportive care:    1. Antimicrobial prophylaxis:  ALL (in remission, receiving post-remission therapy): Bacterial: Levofloxacin 500mg  PO daily (when absolute neutrophils </= 0.5);   Fungal: Fluconazole 200mg  PO daily (when absolute neutrophils </= 0.5);   Viral: Valacyclovir 500mg  PO daily (Continuous);   PJP:  SMX/TMP DS PO BID twice weekly (Sat/Sun)    2. Blood product support:  Leukoreduced blood products are required.  Irradiated blood products are preferred, but in case of urgent transfusion needs non-irradiated blood products may be used: 2 units for Hg <=8.0.     Coordination of care: - Start maintenance   - weekly lab checks      [name], AGPCNP-BC  Nurse Practitioner  Hematology/Oncology  [facility]    [name], MD was available  Leukemia Program  Division of Hematology  [facility]      Nurse Navigator (non-clinical trial patients): Elicia Lamp, RN        Tel. [phone]       Fax. [phone]  Toll-free appointments: [phone]  Scheduling assistance: [phone]  After hours/weekends: [phone] (ask for adult hematology/oncology on-call)      History of Present Illness:   Patricia [name] is a 46 y.o. female with past medical history noted as above who presents for follow up Ph+ ALL.      Her social history is notable for being a Runner, broadcasting/film/video.  She was recently married. She lives with her husband.    Interim history  She reports feeling well.   However she continues to have low grade fevers:  Temps  Fri 100.7  Sat 100.0  Sun 100.2  Headaches occur with the fevers and her feet feel cold. She has not experienced chills in >1 week.    Augmentin, started by Dr. Kari Baars for pain at the former extraction site of a wisdom teeth - right lower. Pain is better but fevers have persisted    Eating/drinking without issues.  Regular BMs.    2 red macular rashes on her chest    Shoulder soreness after putting up Christmas lights  Legs - are deconditioned - felt weak walking up stairs  Left foot - 2 toes numb with vincristine  No numbness/tingling in fingers      Oncology History is as below:   Oncology History Overview Note   Referring/Local Oncologist:    Diagnosis:   10/29/2021  Bone marrow, left iliac, aspiration and biopsy  -  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 The majority of the abnormal cells (64/88) contained an additional BCR/ABL1 fusion signal, while 9/88 abnormal cells contained an additional ABL1 and ASS1 signal.       Genetics:   Karyotype/FISH:   RESULTS   Date Value Ref Range Status   10/28/2021   Final    NOTE: This report reflects a combined study from a peripheral blood and a bone marrow core biopsy. Eleven cells from the peripheral blood and nine cells from the bone marrow core biopsy were analyzed. The BCR/ABL1 FISH analysis was performed on the peripheral blood.     Abnormal Karyotype:  47,XX,t(9;22)(q34;q11.2),+der(22)t(9;22)[18]/46,XX[2]    Abnormal FISH:  A BCR/ABL1 interphase FISH assay showed a signal pattern consistent  with a BCR::ABL1 rearrangement and the 9;22 translocation in 88% of the 100 cells scored. The majority of the abnormal cells (64/88) contained an additional BCR/ABL1 fusion signal, while 9/88 abnormal cells contained an additional ABL1 and ASS1 signal.           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  MRD by flow insufficient, p190 2/100,000  12/10/2021: Cycle 2 B cycle + rituximab  12/14/21: IT#4  12/17/21: IT#5  01/10/22: Cycle 3 A cycle + rituximab  01/24/22: Bmbx - MRD-neg by PCR (p190)   02/17/22: C4 B cycle + ritux  02/18/22: IT#6  03/21/22: C5 A cycle + ritux (4th dose)  03/22/22: IT#7  04/29/22: Bmbx - MRD-neg by PCR (p190)   05/18/22: restart dasatinib 70mg  s/p neutropenia  05/26/22: Start maintenance vincristine 2g, Prednisone 200mg  D1-4   06/02/22: PLAN IT #8           Review of Systems:   ROS reviewed and negative except as noted in H and P     Allergies:  Allergies Allergen Reactions    Erythromycin Hives     Other reaction(s): Not available    Other      Pt cant take ibuprofen due to condition.    Sulfa (Sulfonamide Antibiotics) Anaphylaxis     Other reaction(s): Not available    Azithromycin        Medications:     Current Outpatient Medications:     acetaminophen (TYLENOL 8 HOUR) 650 MG CR tablet, Take 2 tablets (1,300 mg total) by mouth every eight (8) hours as needed for pain., Disp: , Rfl:     albuterol HFA 90 mcg/actuation inhaler, Inhale 2 puffs every six (6) hours as needed for wheezing., Disp: , Rfl:     amoxicillin-clavulanate (AUGMENTIN) 875-125 mg per tablet, Take 1 tablet by mouth two (2) times a day for 28 days., Disp: 56 tablet, Rfl: 0    cetirizine (ZYRTEC) 10 MG tablet, Take 1 tablet (10 mg total) by mouth nightly., Disp: 30 tablet, Rfl: 2    dapsone 100 MG tablet, TAKE 1 TABLET(100 MG) BY MOUTH DAILY, Disp: 30 tablet, Rfl: 11    dasatinib (SPRYCEL) 70 MG tablet, Take 1 tablet (70 mg total) by mouth daily., Disp: 30 tablet, Rfl: 7    escitalopram oxalate (LEXAPRO) 10 MG tablet, Take 1 tablet (10 mg total) by mouth daily., Disp: , Rfl:     fluconazole (DIFLUCAN) 200 MG tablet, Take 1 tablet (200 mg total) by mouth daily., Disp: , Rfl:     valACYclovir (VALTREX) 500 MG tablet, Take 1 tablet (500 mg total) by mouth daily., Disp: 90 tablet, Rfl: 3    hydrocortisone 2.5 % cream, Apply topically two (2) times a day. Apply sparingly twice a day., Disp: 30 g, Rfl: 0    levalbuterol (XOPENEX) 0.31 mg/3 mL nebulizer solution, Inhale 3 mL (0.31 mg total) by nebulization every four (4) hours as needed for wheezing (cough)., Disp: 45 mL, Rfl: 0    levoFLOXacin (LEVAQUIN) 500 MG tablet, Take 1 tablet (500 mg total) by mouth daily., Disp: , Rfl:     oxyCODONE (ROXICODONE) 5 MG immediate release tablet, Take 1-2  tablets (5-10 mg total) by mouth every four (4) hours as needed for pain, Disp: , Rfl:     predniSONE (DELTASONE) 50 MG tablet, Take 4 tablets (200 mg total) by mouth once daily on days 1-5 of each cycle., Disp: 20 tablet, Rfl: 5  No current facility-administered medications for this visit.    Facility-Administered Medications Ordered in Other Visits:     heparin, porcine (PF) 100 unit/mL injection 500 Units, 500 Units, Intravenous, Q30 Min PRN, [name], MD, 500 Units at 06/27/22 1811    LORazepam (ATIVAN) 2 mg/mL injection, , , ,     methotrexate (Preservative Free) 12 mg, hydrocortisone sod succ (Solu-CORTEF) 50 mg in sodium chloride (NS) 0.9 % 6 mL INTRATHECAL syringe, , Intrathecal, Once, [name], MD    OKAY TO SEND MEDICATION/CHEMOTHERAPY TO OUTPATIENT UNIT, , Other, Once, [name], MD    sodium chloride (NS) 0.9 % infusion, 100 mL/hr, Intravenous, Continuous, [name], MD, Stopped at 06/27/22 1800    Medical History:  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       Social History:  Social History     Social History Narrative    Not on file       Family History:  No family history on file.    Objective:   BP 113/62  - Pulse 115  - Temp 37.2 ??C (98.9 ??F) (Oral)  - Resp 16  - Ht 167.6 cm (5' 6)  - Wt 82.5 kg (181 lb 14.1 oz)  - SpO2 95%  - BMI 29.36 kg/m??     Physical Exam:  GENERAL: well-appearing  woman unaccompanied  HEART: Normal color, not excessive pallor.  CHEST/LUNG: Normal work of breathing. No dyspnea with conversation. No crackles. Regular rate/rhythm  ABDOMEN: No obvious distention.    EXTREMITIES: No edema, cyanosis or clubbing.   SKIN: 2 maculopapular rashes on right upper chest.   NEURO EXAM: Grossly intact.       Test Results:  06/27/2022 CBC/d and CMP reviewed with patient

## 2022-06-28 LAB — CMV DNA, QUANTITATIVE, PCR
CMV QUANT LOG(10): 2.69 {Log_IU}/mL — ABNORMAL HIGH (ref <0.00)
CMV QUANT: 492 [IU]/mL — ABNORMAL HIGH (ref <0)
CMV VIRAL LD: DETECTED — AB

## 2022-06-28 NOTE — Unmapped (Signed)
Patient completed and tolerated treatment. Port de-accessed after 500 unit Heparin flush, site covered with band-aid dressing. AVS declined. Pt discharged to home, NAD.

## 2022-06-28 NOTE — Unmapped (Signed)
Hospital Outpatient Visit on 06/27/2022   Component Date Value Ref Range Status    Protein, CSF 06/27/2022 27  20 - 59 mg/dL Final    Glucose, CSF 40/05/2724 78  48 - 79 mg/dL Final    Tube # CSF 36/64/4034 Tube 1   Preliminary    Color, CSF 06/27/2022 Colorless   Preliminary    Appearance, CSF 06/27/2022 Clear   Preliminary    Nucleated Cells, CSF 06/27/2022 2  0 - 5 ul Preliminary    RBC, CSF 06/27/2022 288 (H)  0 ul Preliminary   Lab on 06/27/2022   Component Date Value Ref Range Status    Sodium 06/27/2022 140  135 - 145 mmol/L Final    Potassium 06/27/2022 3.7  3.4 - 4.8 mmol/L Final    Chloride 06/27/2022 107  98 - 107 mmol/L Final    CO2 06/27/2022 26.0  20.0 - 31.0 mmol/L Final    Anion Gap 06/27/2022 7  5 - 14 mmol/L Final    BUN 06/27/2022 11  9 - 23 mg/dL Final    Creatinine 74/25/9563 0.64  0.55 - 1.02 mg/dL Final    BUN/Creatinine Ratio 06/27/2022 17   Final    eGFR CKD-EPI (2021) Female 06/27/2022 >90  >=60 mL/min/1.32m2 Final    eGFR calculated with CKD-EPI 2021 equation in accordance with [employer] and [employer] of Nephrology Task Force recommendations.    Glucose 06/27/2022 148  70 - 179 mg/dL Final    Calcium 87/56/4332 8.6 (L)  8.7 - 10.4 mg/dL Final    Albumin 95/18/8416 3.8  3.4 - 5.0 g/dL Final    Total Protein 06/27/2022 6.2  5.7 - 8.2 g/dL Final    Total Bilirubin 06/27/2022 0.5  0.3 - 1.2 mg/dL Final    AST 60/63/0160 55 (H)  <=34 U/L Final    ALT 06/27/2022 84 (H)  10 - 49 U/L Final    Alkaline Phosphatase 06/27/2022 118 (H)  46 - 116 U/L Final    WBC 06/27/2022 2.3 (L)  3.6 - 11.2 10*9/L Final    RBC 06/27/2022 2.49 (L)  3.95 - 5.13 10*12/L Final    HGB 06/27/2022 9.0 (L)  11.3 - 14.9 g/dL Final    HCT 10/93/2355 26.1 (L)  34.0 - 44.0 % Final    MCV 06/27/2022 104.7 (H)  77.6 - 95.7 fL Final    MCH 06/27/2022 36.3 (H)  25.9 - 32.4 pg Final    MCHC 06/27/2022 34.6  32.0 - 36.0 g/dL Final    RDW 73/22/0254 21.1 (H)  12.2 - 15.2 % Final    MPV 06/27/2022 6.8  6.8 - 10.7 fL Final    Platelet 06/27/2022 90 (L)  150 - 450 10*9/L Final    Neutrophils % 06/27/2022 25.3  % Final    Lymphocytes % 06/27/2022 51.3  % Final    Monocytes % 06/27/2022 21.8  % Final    Eosinophils % 06/27/2022 1.3  % Final    Basophils % 06/27/2022 0.3  % Final    Absolute Neutrophils 06/27/2022 0.6 (L)  1.8 - 7.8 10*9/L Final    Absolute Lymphocytes 06/27/2022 1.2  1.1 - 3.6 10*9/L Final    Absolute Monocytes 06/27/2022 0.5  0.3 - 0.8 10*9/L Final    Absolute Eosinophils 06/27/2022 0.0  0.0 - 0.5 10*9/L Final    Absolute Basophils 06/27/2022 0.0  0.0 - 0.1 10*9/L Final    Anisocytosis 06/27/2022 Moderate (A)  Not Present Final

## 2022-06-28 NOTE — Unmapped (Signed)
Addended by: [name] T on: 06/27/2022 08:21 PM     Modules accepted: Orders

## 2022-06-28 NOTE — Unmapped (Signed)
Proceed with the vincristine and IT therapy today. Start the steroids and follow the directions from the pharmacy team.    Please keep [place] up to date about the fevers.     Continue weekly labs at [facility].    I have ordered CTs of the chest and sinus to evaluate for possible sources of infection.    Use triamcinolone cream twice a day on the 2 spots on your chest.    I will see you back in 4 weeks for the next cycle.

## 2022-06-29 DIAGNOSIS — D801 Nonfamilial hypogammaglobulinemia: Principal | ICD-10-CM

## 2022-06-30 ENCOUNTER — Ambulatory Visit: Admit: 2022-06-30 | Discharge: 2022-07-01 | Payer: PRIVATE HEALTH INSURANCE

## 2022-06-30 DIAGNOSIS — G44221 Chronic tension-type headache, intractable: Principal | ICD-10-CM

## 2022-06-30 MED ORDER — RIBOFLAVIN (VITAMIN B2) 400 MG TABLET
ORAL_TABLET | Freq: Every day | ORAL | 6 refills | 0 days | Status: CP
Start: 2022-06-30 — End: ?

## 2022-06-30 MED ORDER — TIZANIDINE 4 MG TABLET
ORAL_TABLET | Freq: Every evening | ORAL | 2 refills | 90 days | Status: CP
Start: 2022-06-30 — End: 2023-06-30

## 2022-06-30 MED ORDER — MAGNESIUM OXIDE 400 MG (241.3 MG MAGNESIUM) TABLET
ORAL_TABLET | Freq: Every day | ORAL | 11 refills | 30 days | Status: CP
Start: 2022-06-30 — End: 2023-06-30

## 2022-06-30 NOTE — Unmapped (Addendum)
Thank you for coming to your clinic appointment today, based upon your visit we believe that you will need to have a sleep study to evaluate for sleep apnea, magnesium 400 mg and riboflavin 400 mg daily,  and you will need to return in 3-6 months. Tizanidine 4 mg nightly for migraine and tension headaches. Please attempt to stop tylenol as this can cause rebound headaches. Recommend heating pad for the neck during the headaches.

## 2022-06-30 NOTE — Unmapped (Addendum)
Neurology Follow-up Visit Note     MMNT [address]  [facility] [address] [place]  [address]  [place] [address]  [phone]    Date: 06/30/2022   [name]   MRN: [id number]   PCP: [name]  Referring Provider: [name], MD       Assessment and Plan          Patricia [name] is a 46 y.o. female with a past medical history of PH+B Cell ALL, Immunocompromised fever, peripheral neuropathy, anxiety  presenting for follow-up of Headaches of multifactorial origin including rebound and migraine headaches.     Headaches of multifactorial origin including rebound and migraine headaches and untreated sleep apnea: Based upon the patient's past medical history, clinical presentation and neurological exam patient is experiencing headaches of multifactorial origin. The patient has daily dull headaches which are most likely secondary to untreated sleep apnea. There is also a concern for rebound headaches as the patient is taking almost daily acetaminophen for fevers currently thought to be secondary to her cancer. Patient will require a polysomnography to evaluate for sleep apnea.  The patient reports significant fatigue, snoring as well as episodes of waking up gasping for air.  Untreated sleep apnea can worsen migraine headaches as well as because they will tension headaches which the patient states that she wakes up every morning with.  Patient also has migraine headaches which are not frequent and maybe take place once a month. She describes those headaches as unilateral with nausea, vomiting and having sensitivity to light and sound.  Tizanidine 4 mg nightly has been prescribed, which can be increased if the patient does not experience side effects secondary this medication. Finally there is a concern for rebound headaches given the patient's almost daily use of acetaminophen.  Patient will need to speak to her oncologist regarding the cost versus benefits of using this medication as regular or greater than 2 times a week use of this medication will worsen migraines, tension headaches can cause rebound headaches.  For migraine prophylaxis the patient has also been prescribed magnesium 400 mg daily and riboflavin 400 mg daily.  The patient is unable to be prescribed Topamax due to her history of kidney stones, cannot be prescribed an NSAID due to her current oncological condition and there is a concern for excess dry mouth which may be secondary to untreated sleep apnea but nortriptyline could not be prescribed at this time.  If the tizanidine is unsuccessful in helping her multifactorial headaches upon repeat evaluation the patient may be prescribed a CGRP inhibitor.  Patient will need to return to [facility] neurology for further evaluation in the next 3 to 6 months    Plan  1.  Polysomnography for evaluation of obstructive sleep apnea   2.  Magnesium 400 mg daily  3.  Riboflavin 400 mg daily  4.  Tizanidine 4 mg nightly   5.  The patient will need to return to [facility] neurology in the next 3 to 6 months    Return Visit Discussed: Return for Follow up 3-6 months.    I personally spent 60 minutes face-to-face and non-face-to-face in the care of this patient, which includes all pre, intra, and post visit time on the date of service.    This patient was discussed with Dr. Fabian November who agrees with the above assessment and plan.       Idelia Salm DO PGY-4  [facility] Neurology  ATTESTATION NOTE:  I discussed the patient's case with the resident. I reviewed the resident???s note and agree with the resident???s findings and plan as documented in their note.     Laurian Brim, DO  Assistant Professor  Department of Neurology  [place] [facility]          HPI         HPI: Patricia [name] is a 46 y.o. female who presents for follow up to the [facility] for follow-up of headaches of multifactorial origin. Patient was last seen by [facility] Neurology on 02/21/2022.      To review their relevant history, the patient continues to have daily headaches as well as occasional migraine headaches. She is continuing treatment of her underlying malignancy.     This patient was seen by [facility] neurology on 02/21/2022 for a follow-up appointment regarding multifactorial headaches including tension, rebound and migraine headaches.    Patient reports several types of headaches including a daily dull tension headache, and rare but severely debilitating migraine headaches which are unilateral in nature and are accompanied by nausea vomiting, light sensitivity and sound sensitivity.  The patient is mostly having the dull, daily headaches.  Patient is also taking Tylenol almost daily for fevers which were thought to be secondary to her malignancy.  The patient has agreed to speak to her oncologist regarding the use of this medication and if an alternative given.  Patient is currently continuing to have treatment for her malignancy. The patient has agreed to have a polysomnography test to evaluate for sleep apnea.  The patient reports that her spouse reports significant snoring from her as well as episodes of gasping for air during the night.  The patient had a concern prior to being seen by [facility] neurology that she may have sleep apnea.  Patient states that she wakes up every morning with a dull headache as well as dry mouth.  There is also reports of significant fatigue during the day by the patient.  Patient reports a significant previous history of kidney stones and understands that she is unable to take the medication Topamax due to that.  The patient has agreed to try tizanidine as this medication has minimal interactions with any of her other medical regiment for her other medical conditions.  This medication also helps multiple different types of headache syndromes.  Magnesium and riboflavin have also been prescribed to the patient which are low risk supplements and may potentially help the patient going forward.  Conservative measures such as heat to the back of the neck and shoulders to relieve muscle tension have also been discussed with the patient. Patient has agreed to contact [facility] neurology via MyChart with any further questions or concerns.This patient will need to be seen again by [facility] neurology in 3 to 6 months      Allergies   Allergen Reactions    Erythromycin Hives     Other reaction(s): Not available    Other      Pt cant take ibuprofen due to condition.    Sulfa (Sulfonamide Antibiotics) Anaphylaxis     Other reaction(s): Not available    Azithromycin         Current Outpatient Medications   Medication Sig Dispense Refill    acetaminophen (TYLENOL 8 HOUR) 650 MG CR tablet Take 2 tablets (1,300 mg total) by mouth every eight (8) hours as needed for pain.      albuterol  HFA 90 mcg/actuation inhaler Inhale 2 puffs every six (6) hours as needed for wheezing.      amoxicillin-clavulanate (AUGMENTIN) 875-125 mg per tablet Take 1 tablet by mouth two (2) times a day for 28 days. 56 tablet 0    cetirizine (ZYRTEC) 10 MG tablet Take 1 tablet (10 mg total) by mouth nightly. 30 tablet 2    dapsone 100 MG tablet TAKE 1 TABLET(100 MG) BY MOUTH DAILY 30 tablet 11    dasatinib (SPRYCEL) 70 MG tablet Take 1 tablet (70 mg total) by mouth daily. 30 tablet 7    escitalopram oxalate (LEXAPRO) 10 MG tablet Take 1 tablet (10 mg total) by mouth daily.      fluconazole (DIFLUCAN) 200 MG tablet Take 1 tablet (200 mg total) by mouth daily.      levalbuterol (XOPENEX) 0.31 mg/3 mL nebulizer solution Inhale 3 mL (0.31 mg total) by nebulization every four (4) hours as needed for wheezing (cough). 45 mL 0    levoFLOXacin (LEVAQUIN) 500 MG tablet Take 1 tablet (500 mg total) by mouth daily.      predniSONE (DELTASONE) 50 MG tablet Take 4 tablets (200 mg total) by mouth once daily on days 1-5 of each cycle. 20 tablet 5    triamcinolone (KENALOG) 0.1 % cream Apply topically two (2) times a day for 14 days. 45 g 0    valACYclovir (VALTREX) 500 MG tablet Take 1 tablet (500 mg total) by mouth daily. 90 tablet 3    oxyCODONE (ROXICODONE) 5 MG immediate release tablet Take 1-2 tablets (5-10 mg total) by mouth every four (4) hours as needed for pain (Patient not taking: Reported on 06/30/2022)       No current facility-administered medications for this visit.       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       Past Surgical History:   Procedure Laterality Date    IR INSERT PORT AGE GREATER THAN 5 YRS  12/08/2021    IR INSERT PORT AGE GREATER THAN 5 YRS 12/08/2021 [name], PA IMG VIR [facility]       Social History     Socioeconomic History    Marital status: Married     Spouse name: None    Number of children: None    Years of education: None    Highest education level: None   Tobacco Use    Smoking status: Former     Types: Cigarettes     Quit date: 11/12/2015     Years since quitting: 6.6    Smokeless tobacco: Never   Substance and Sexual Activity    Alcohol use: Not Currently    Drug use: Never     Social Determinants of Health     Financial Resource Strain: Low Risk  (11/02/2021)    Overall Financial Resource Strain (CARDIA)     Difficulty of Paying Living Expenses: Not hard at all   Food Insecurity: No Food Insecurity (11/02/2021)    Hunger Vital Sign     Worried About Running Out of Food in the Last Year: Never true     Ran Out of Food in the Last Year: Never true   Transportation Needs: No Transportation Needs (11/02/2021)    PRAPARE - Therapist, art (Medical): No     Lack of Transportation (Non-Medical): No       No family history on file.  Review of Systems     A 10-system review of systems was conducted and was negative except as documented above in the HPI.       Objective        Vital signs: There were no vitals taken for this visit.     Physical Exam:  General Appearance:Well appearing. In no acute distress.  HEENT: Head is atraumatic and normocephalic. Sclera anicteric without injection. Oropharyngeal membranes are moist with no erythema or exudate.  Neck: Supple.  Lungs: Clear to auscultation in anterior fields. No wheezes or crackles.  Heart: Regular rate and rhythm. No murmurs, rubs, or gallops.  Abdomen: Soft, nontender, nondistended.  Extremities: No clubbing, cyanosis, or edema.    Neurological Examination:     Mental Status: Alert, conversant, able to follow conversation and interview. Spontaneous speech was fluent without word finding pauses, dysarthria, or paraphasic errors. Comprehension was intact to simple and multi-step commands. Memory for recent and remote events was intact.    Cranial Nerves: PERRL 3 mm. Pursuit eye movements were uninterrupted with full range and without more than end-gaze nystagmus. Facial sensation intact bilaterally to light touch on the forehead, cheek, and chin. Face symmetric at rest. Normal facial movement bilaterally, including forehead, eye closure and grimace/smile. Hearing intact to conversation. Shoulder shrug full strength bilaterally. Palate movement is symmetric. Tongue protrudes midline and tongue movements are normal.    Motor Exam: Normal bulk.  No tremors, myoclonus, or other adventitious movement.  Pronator drift is absent.  RUE: 5/5 grossly throughout.  LUE: 5/5 grossly throughout.  RLE: 5/5 grossly throughout.  LLE: 5/5 grossly throughout.      Reflexes:   R L   Biceps +2 +2   Brachioradialis +2 +2   Triceps +2 +2   Patella +2 +2   Achilles +1 +1   Toes are downgoing bilaterally.    Sensory: Sensation normal to light touch, pinprick and temperature sensation to cold in both hands and both feet and to position sense and vibration distally in the fingers and toes.    Cerebellar/Coordination/Gait: Rapid alternating movements are normal in bilateral upper extremities. Finger-to-nose is normal without ataxia or dysmetria bilaterally. Heel-to-shin is normal without ataxia or dysmetria bilaterally. Gait exam demonstrates normal posture, base, stride length, arm swing and turns.   Diagnostic Studies       Hospital Outpatient Visit on 06/27/2022   Component Date Value Ref Range Status    Specimen Type 06/27/2022    Final                    Value:Blood    BCR::ABL1 p190 Assay 06/27/2022 Negative   Final    BCR::ABL1 p190 Transcripts/100,000* 06/27/2022 <1  <1 p190 transcripts/100,000 cells Final    BCR/ABL1 p190 Assay Results 06/27/2022    Final                    Value:This result contains rich text formatting which cannot be displayed here.    CD3% (T Cells) 06/27/2022 94 (H)  61 - 86 % Final    Absolute CD3 Count 06/27/2022 1,128  915 - 3,400 /uL Final    CD4% (T Helper) 06/27/2022 23 (L)  34 - 58 % Final    Absolute CD4 Count 06/27/2022 276 (L)  510 - 2,320 /uL Final    CD8% T Suppressor 06/27/2022 66 (H)  12 - 38 % Final    Absolute CD8 Count 06/27/2022 792  180 - 1,520 /uL Final  CD4:CD8 Ratio 06/27/2022 0.3 (L)  0.9 - 4.8 Final   Hospital Outpatient Visit on 06/27/2022   Component Date Value Ref Range Status    Protein, CSF 06/27/2022 27  20 - 59 mg/dL Final    Glucose, CSF 16/05/9603 78  48 - 79 mg/dL Final    Tube # CSF 54/04/8118 Tube 1   Final    Color, CSF 06/27/2022 Colorless   Final    Appearance, CSF 06/27/2022 Clear   Final    Nucleated Cells, CSF 06/27/2022 2  0 - 5 ul Final    RBC, CSF 06/27/2022 288 (H)  0 ul Final    #Cells counted for Diff 06/27/2022 100   Final    Neutrophil %, CSF 06/27/2022 3.0  % Final    Lymphs %, CSF 06/27/2022 47.0  % Final    Mono/Macrophage %, CSF 06/27/2022 50.0  % Final    Diagnosis 06/27/2022    Final                    Value:This result contains rich text formatting which cannot be displayed here.    Diagnosis Comment 06/27/2022    Final                    Value:This result contains rich text formatting which cannot be displayed here.    Clinical History 06/27/2022    Final                    Value:This result contains rich text formatting which cannot be displayed here.    Gross Description 06/27/2022    Final                    Value:This result contains rich text formatting which cannot be displayed here.    Microscopic Description 06/27/2022    Final                    Value:This result contains rich text formatting which cannot be displayed here.    Disclaimer 06/27/2022    Final                    Value:This result contains rich text formatting which cannot be displayed here.   Lab on 06/27/2022   Component Date Value Ref Range Status    Sodium 06/27/2022 140  135 - 145 mmol/L Final    Potassium 06/27/2022 3.7  3.4 - 4.8 mmol/L Final    Chloride 06/27/2022 107  98 - 107 mmol/L Final    CO2 06/27/2022 26.0  20.0 - 31.0 mmol/L Final    Anion Gap 06/27/2022 7  5 - 14 mmol/L Final    BUN 06/27/2022 11  9 - 23 mg/dL Final    Creatinine 14/78/2956 0.64  0.55 - 1.02 mg/dL Final    BUN/Creatinine Ratio 06/27/2022 17   Final    eGFR CKD-EPI (2021) Female 06/27/2022 >90  >=60 mL/min/1.91m2 Final    eGFR calculated with CKD-EPI 2021 equation in accordance with [employer] and [employer] of Nephrology Task Force recommendations.    Glucose 06/27/2022 148  70 - 179 mg/dL Final    Calcium 21/30/8657 8.6 (L)  8.7 - 10.4 mg/dL Final    Albumin 84/69/6295 3.8  3.4 - 5.0 g/dL Final    Total Protein 06/27/2022 6.2  5.7 - 8.2 g/dL Final    Total Bilirubin 06/27/2022 0.5  0.3 - 1.2 mg/dL Final  AST 06/27/2022 55 (H)  <=34 U/L Final    ALT 06/27/2022 84 (H)  10 - 49 U/L Final    Alkaline Phosphatase 06/27/2022 118 (H)  46 - 116 U/L Final    CMV Viral Ld 06/27/2022 Detected (A)  Not Detected Final    CMV Quant 06/27/2022 492 (H)  <0 IU/mL Final    CMV Quant Log(10) 06/27/2022 2.69 (H)  <0.00 log IU/mL Final    WBC 06/27/2022 2.3 (L)  3.6 - 11.2 10*9/L Final    RBC 06/27/2022 2.49 (L)  3.95 - 5.13 10*12/L Final    HGB 06/27/2022 9.0 (L)  11.3 - 14.9 g/dL Final    HCT 16/05/9603 26.1 (L)  34.0 - 44.0 % Final    MCV 06/27/2022 104.7 (H)  77.6 - 95.7 fL Final    MCH 06/27/2022 36.3 (H)  25.9 - 32.4 pg Final    MCHC 06/27/2022 34.6  32.0 - 36.0 g/dL Final    RDW 54/04/8118 21.1 (H)  12.2 - 15.2 % Final    MPV 06/27/2022 6.8  6.8 - 10.7 fL Final    Platelet 06/27/2022 90 (L)  150 - 450 10*9/L Final    Neutrophils % 06/27/2022 25.3  % Final    Lymphocytes % 06/27/2022 51.3  % Final    Monocytes % 06/27/2022 21.8  % Final    Eosinophils % 06/27/2022 1.3  % Final    Basophils % 06/27/2022 0.3  % Final    Absolute Neutrophils 06/27/2022 0.6 (L)  1.8 - 7.8 10*9/L Final    Absolute Lymphocytes 06/27/2022 1.2  1.1 - 3.6 10*9/L Final    Absolute Monocytes 06/27/2022 0.5  0.3 - 0.8 10*9/L Final    Absolute Eosinophils 06/27/2022 0.0  0.0 - 0.5 10*9/L Final    Absolute Basophils 06/27/2022 0.0  0.0 - 0.1 10*9/L Final    Anisocytosis 06/27/2022 Moderate (A)  Not Present Final    Total IgG 06/27/2022 237 (L)  650 - 1,600 mg/dL Final   Office Visit on 06/22/2022   Component Date Value Ref Range Status    Adenovirus 06/22/2022 Not Detected  Not Detected Final    Coronavirus HKU1 06/22/2022 Not Detected  Not Detected Final    Coronavirus NL63 06/22/2022 Not Detected  Not Detected Final    Coronavirus 229E 06/22/2022 Not Detected  Not Detected Final    Coronavirus OC43 PCR 06/22/2022 Not Detected  Not Detected Final    Metapneumovirus 06/22/2022 Not Detected  Not Detected Final    Rhinovirus/Enterovirus 06/22/2022 Not Detected  Not Detected Final    Influenza A 06/22/2022 Not Detected  Not Detected Final    Influenza B 06/22/2022 Not Detected  Not Detected Final    Parainfluenza 1 06/22/2022 Not Detected  Not Detected Final    Parainfluenza 2 06/22/2022 Not Detected  Not Detected Final    Parainfluenza 3 06/22/2022 Not Detected  Not Detected Final    Parainfluenza 4 06/22/2022 Not Detected  Not Detected Final    RSV 06/22/2022 Not Detected  Not Detected Final    Bordetella pertussis 06/22/2022 Not Detected  Not Detected Final    If B. pertussis/parapertussis infection is suspected, the Bordetella pertussis/parapertussis Qualitative PCR test should be ordered.    Bordetella parapertussis 06/22/2022 Not Detected  Not Detected Final    Chlamydophila (Chlamydia) pneumoni* 06/22/2022 Not Detected  Not Detected Final    Mycoplasma pneumoniae 06/22/2022 Not Detected  Not Detected Final    SARS-CoV-2 PCR 06/22/2022 Not Detected  Not Detected  Final    Blood Culture, Routine 06/22/2022 No Growth at 5 days   Final    Blood Culture, Routine 06/22/2022 No Growth at 5 days   Final    CMV Viral Ld 06/22/2022 Detected (A)  Not Detected Final    CMV Quant 06/22/2022 230 (H)  <0 IU/mL Final    CMV Quant Log(10) 06/22/2022 2.36 (H)  <0.00 log IU/mL Final   Clinical Support on 06/21/2022   Component Date Value Ref Range Status    WBC 06/21/2022 2.2 (L)  3.6 - 11.2 10*9/L Final    RBC 06/21/2022 2.60 (L)  3.95 - 5.13 10*12/L Final    HGB 06/21/2022 9.2 (L)  11.3 - 14.9 g/dL Final    HCT 30/86/5784 27.0 (L)  34.0 - 44.0 % Final    MCV 06/21/2022 103.9 (H)  77.6 - 95.7 fL Final    MCH 06/21/2022 35.4 (H)  25.9 - 32.4 pg Final    MCHC 06/21/2022 34.1  32.0 - 36.0 g/dL Final    RDW 69/62/9528 20.9 (H)  12.2 - 15.2 % Final    MPV 06/21/2022 7.1  6.8 - 10.7 fL Final    Platelet 06/21/2022 98 (L)  150 - 450 10*9/L Final    nRBC 06/21/2022 0  <=4 /100 WBCs Final    Neutrophils % 06/21/2022 27.7  % Final    Lymphocytes % 06/21/2022 53.9  % Final    Monocytes % 06/21/2022 15.4  % Final    Eosinophils % 06/21/2022 2.4  % Final    Basophils % 06/21/2022 0.6  % Final    Absolute Neutrophils 06/21/2022 0.6 (L)  1.8 - 7.8 10*9/L Final    Absolute Lymphocytes 06/21/2022 1.2  1.1 - 3.6 10*9/L Final    Absolute Monocytes 06/21/2022 0.3  0.3 - 0.8 10*9/L Final    Absolute Eosinophils 06/21/2022 0.1  0.0 - 0.5 10*9/L Final    Absolute Basophils 06/21/2022 0.0  0.0 - 0.1 10*9/L Final    Anisocytosis 06/21/2022 Marked (A)  Not Present Final    Sodium 06/21/2022 142  135 - 145 mmol/L Final Potassium 06/21/2022 3.7  3.4 - 4.8 mmol/L Final    Chloride 06/21/2022 109 (H)  98 - 107 mmol/L Final    CO2 06/21/2022 26.0  20.0 - 31.0 mmol/L Final    Anion Gap 06/21/2022 7  5 - 14 mmol/L Final    BUN 06/21/2022 14  9 - 23 mg/dL Final    Creatinine 41/32/4401 0.70  0.55 - 1.02 mg/dL Final    BUN/Creatinine Ratio 06/21/2022 20   Final    eGFR CKD-EPI (2021) Female 06/21/2022 >90  >=60 mL/min/1.66m2 Final    eGFR calculated with CKD-EPI 2021 equation in accordance with [employer] and [employer] of Nephrology Task Force recommendations.    Glucose 06/21/2022 168  70 - 179 mg/dL Final    Calcium 02/72/5366 9.1  8.7 - 10.4 mg/dL Final    Albumin 44/09/4740 3.7  3.4 - 5.0 g/dL Final    Total Protein 06/21/2022 6.0  5.7 - 8.2 g/dL Final    Total Bilirubin 06/21/2022 0.7  0.3 - 1.2 mg/dL Final    AST 59/56/3875 68 (H)  <=34 U/L Final    ALT 06/21/2022 115 (H)  10 - 49 U/L Final    Alkaline Phosphatase 06/21/2022 116  46 - 116 U/L Final   Clinical Support on 06/14/2022   Component Date Value Ref Range Status    Blood Type 06/14/2022 O POS  Final    Antibody Screen 06/14/2022 NEG   Final    WBC 06/14/2022 3.3 (L)  3.6 - 11.2 10*9/L Final    RBC 06/14/2022 3.13 (L)  3.95 - 5.13 10*12/L Final    HGB 06/14/2022 11.0 (L)  11.3 - 14.9 g/dL Final    HCT 30/86/5784 32.4 (L)  34.0 - 44.0 % Final    MCV 06/14/2022 103.5 (H)  77.6 - 95.7 fL Final    MCH 06/14/2022 35.2 (H)  25.9 - 32.4 pg Final    MCHC 06/14/2022 34.0  32.0 - 36.0 g/dL Final    RDW 69/62/9528 21.7 (H)  12.2 - 15.2 % Final    MPV 06/14/2022 7.0  6.8 - 10.7 fL Final    Platelet 06/14/2022 100 (L)  150 - 450 10*9/L Final    nRBC 06/14/2022 0  <=4 /100 WBCs Final    Neutrophils % 06/14/2022 51.4  % Final    Lymphocytes % 06/14/2022 33.9  % Final    Monocytes % 06/14/2022 12.0  % Final    Eosinophils % 06/14/2022 2.3  % Final    Basophils % 06/14/2022 0.4  % Final    Absolute Neutrophils 06/14/2022 1.7 (L)  1.8 - 7.8 10*9/L Final    Absolute Lymphocytes 06/14/2022 1.1  1.1 - 3.6 10*9/L Final    Absolute Monocytes 06/14/2022 0.4  0.3 - 0.8 10*9/L Final    Absolute Eosinophils 06/14/2022 0.1  0.0 - 0.5 10*9/L Final    Absolute Basophils 06/14/2022 0.0  0.0 - 0.1 10*9/L Final    Anisocytosis 06/14/2022 Marked (A)  Not Present Final    CMV Viral Ld 06/14/2022 Detected (A)  Not Detected Final    CMV Quant 06/14/2022 156 (H)  <0 IU/mL Final    CMV Quant Log(10) 06/14/2022 2.19 (H)  <0.00 log IU/mL Final    Smear Review Comments 06/14/2022 See Comment (A)  Undefined Final    Smear Reviewed    Polychromasia 06/14/2022 Slight (A)  Not Present Final   Clinical Support on 06/06/2022   Component Date Value Ref Range Status    CMV Viral Ld 06/06/2022 Detected (A)  Not Detected Final    CMV Quant 06/06/2022 223 (H)  <0 IU/mL Final    CMV Quant Log(10) 06/06/2022 2.35 (H)  <0.00 log IU/mL Final    Sodium 06/06/2022 144  135 - 145 mmol/L Final    Potassium 06/06/2022 3.9  3.4 - 4.8 mmol/L Final    Chloride 06/06/2022 111 (H)  98 - 107 mmol/L Final    CO2 06/06/2022 29.3  20.0 - 31.0 mmol/L Final    Anion Gap 06/06/2022 4 (L)  5 - 14 mmol/L Final    BUN 06/06/2022 13  9 - 23 mg/dL Final    Creatinine 41/32/4401 0.62  0.55 - 1.02 mg/dL Final    BUN/Creatinine Ratio 06/06/2022 21   Final    eGFR CKD-EPI (2021) Female 06/06/2022 >90  >=60 mL/min/1.21m2 Final    eGFR calculated with CKD-EPI 2021 equation in accordance with [employer] and [employer] of Nephrology Task Force recommendations.    Glucose 06/06/2022 188 (H)  70 - 179 mg/dL Final    Calcium 02/72/5366 9.2  8.7 - 10.4 mg/dL Final    Magnesium 44/09/4740 2.0  1.6 - 2.6 mg/dL Final    Phosphorus 59/56/3875 3.6  2.4 - 5.1 mg/dL Final    WBC 64/33/2951 1.7 (L)  3.6 - 11.2 10*9/L Final    RBC 06/06/2022 2.09 (L)  3.95 - 5.13 10*12/L Final    HGB 06/06/2022 7.9 (L)  11.3 - 14.9 g/dL Final    HCT 16/05/9603 23.1 (L)  34.0 - 44.0 % Final    MCV 06/06/2022 110.8 (H)  77.6 - 95.7 fL Final    MCH 06/06/2022 37.8 (H)  25.9 - 32.4 pg Final    MCHC 06/06/2022 34.1  32.0 - 36.0 g/dL Final    RDW 54/04/8118 19.1 (H)  12.2 - 15.2 % Final    MPV 06/06/2022 7.5  6.8 - 10.7 fL Final    Platelet 06/06/2022 78 (L)  150 - 450 10*9/L Final    nRBC 06/06/2022 0  <=4 /100 WBCs Final    Neutrophils % 06/06/2022 58.8  % Final    Lymphocytes % 06/06/2022 26.2  % Final    Monocytes % 06/06/2022 8.2  % Final    Eosinophils % 06/06/2022 6.4  % Final    Basophils % 06/06/2022 0.4  % Final    Absolute Neutrophils 06/06/2022 1.0 (L)  1.8 - 7.8 10*9/L Final    Absolute Lymphocytes 06/06/2022 0.5 (L)  1.1 - 3.6 10*9/L Final    Absolute Monocytes 06/06/2022 0.1 (L)  0.3 - 0.8 10*9/L Final    Absolute Eosinophils 06/06/2022 0.1  0.0 - 0.5 10*9/L Final    Absolute Basophils 06/06/2022 0.0  0.0 - 0.1 10*9/L Final    Macrocytosis 06/06/2022 Slight (A)  Not Present Final    Anisocytosis 06/06/2022 Moderate (A)  Not Present Final    Blood Type 06/06/2022 O POS   Final    Antibody Screen 06/06/2022 NEG   Final   Hospital Outpatient Visit on 06/02/2022   Component Date Value Ref Range Status    Protein, CSF 06/02/2022 26  20 - 59 mg/dL Final    Glucose, CSF 14/78/2956 64  48 - 79 mg/dL Final    Tube # CSF 21/30/8657 Tube 1   Final    Color, CSF 06/02/2022 Colorless   Final    Appearance, CSF 06/02/2022 Clear   Final    Nucleated Cells, CSF 06/02/2022 3  0 - 5 ul Final    RBC, CSF 06/02/2022 28 (H)  0 ul Final    #Cells counted for Diff 06/02/2022 74   Final    Lymphs %, CSF 06/02/2022 68.9  % Final    Mono/Macrophage %, CSF 06/02/2022 31.1  % Final    Comment CSF 06/02/2022 Macrophages present.   Final    Diagnosis 06/02/2022    Final                    Value:This result contains rich text formatting which cannot be displayed here.    Diagnosis Comment 06/02/2022    Final                    Value:This result contains rich text formatting which cannot be displayed here.    Clinical History 06/02/2022    Final                    Value:This result contains rich text formatting which cannot be displayed here.    Gross Description 06/02/2022    Final                    Value:This result contains rich text formatting which cannot be displayed here.    Microscopic Description 06/02/2022    Final  Value:This result contains rich text formatting which cannot be displayed here.    Disclaimer 06/02/2022    Final                    Value:This result contains rich text formatting which cannot be displayed here.   Lab on 06/02/2022   Component Date Value Ref Range Status    ABO Grouping 06/02/2022 O POS   Final    Antibody Screen 06/02/2022 NEG   Final    CMV Viral Ld 06/02/2022 Detected (A)  Not Detected Final    CMV Quant 06/02/2022 670 (H)  <0 IU/mL Final    CMV Quant Log(10) 06/02/2022 2.83 (H)  <0.00 log IU/mL Final    WBC 06/02/2022 2.9 (L)  3.6 - 11.2 10*9/L Final    RBC 06/02/2022 2.36 (L)  3.95 - 5.13 10*12/L Final    HGB 06/02/2022 8.7 (L)  11.3 - 14.9 g/dL Final    HCT 29/56/2130 25.1 (L)  34.0 - 44.0 % Final    MCV 06/02/2022 106.7 (H)  77.6 - 95.7 fL Final    MCH 06/02/2022 37.0 (H)  25.9 - 32.4 pg Final    MCHC 06/02/2022 34.7  32.0 - 36.0 g/dL Final    RDW 86/57/8469 19.9 (H)  12.2 - 15.2 % Final    MPV 06/02/2022 7.3  6.8 - 10.7 fL Final    Platelet 06/02/2022 85 (L)  150 - 450 10*9/L Final    Neutrophils % 06/02/2022 75.1  % Final    Lymphocytes % 06/02/2022 12.9  % Final    Monocytes % 06/02/2022 9.9  % Final    Eosinophils % 06/02/2022 1.6  % Final    Basophils % 06/02/2022 0.5  % Final    Absolute Neutrophils 06/02/2022 2.2  1.8 - 7.8 10*9/L Final    Absolute Lymphocytes 06/02/2022 0.4 (L)  1.1 - 3.6 10*9/L Final    Absolute Monocytes 06/02/2022 0.3  0.3 - 0.8 10*9/L Final    Absolute Eosinophils 06/02/2022 0.0  0.0 - 0.5 10*9/L Final    Absolute Basophils 06/02/2022 0.0  0.0 - 0.1 10*9/L Final    Macrocytosis 06/02/2022 Slight (A)  Not Present Final    Anisocytosis 06/02/2022 Moderate (A)  Not Present Final   Lab on 05/26/2022   Component Date Value Ref Range Status    ABO Grouping 05/26/2022 O POS   Final    Antibody Screen 05/26/2022 NEG   Final    Sodium 05/26/2022 140  135 - 145 mmol/L Final    Potassium 05/26/2022 3.8  3.4 - 4.8 mmol/L Final    Chloride 05/26/2022 106  98 - 107 mmol/L Final    CO2 05/26/2022 26.0  20.0 - 31.0 mmol/L Final    Anion Gap 05/26/2022 8  5 - 14 mmol/L Final    BUN 05/26/2022 17  9 - 23 mg/dL Final    Creatinine 62/95/2841 0.66  0.55 - 1.02 mg/dL Final    BUN/Creatinine Ratio 05/26/2022 26   Final    eGFR CKD-EPI (2021) Female 05/26/2022 >90  >=60 mL/min/1.35m2 Final    eGFR calculated with CKD-EPI 2021 equation in accordance with [employer] and [employer] of Nephrology Task Force recommendations.    Glucose 05/26/2022 182 (H)  70 - 179 mg/dL Final    Calcium 32/44/0102 8.7  8.7 - 10.4 mg/dL Final    Albumin 72/53/6644 3.9  3.4 - 5.0 g/dL Final    Total Protein 05/26/2022 5.9  5.7 -  8.2 g/dL Final    Total Bilirubin 05/26/2022 0.7  0.3 - 1.2 mg/dL Final    AST 16/05/9603 54 (H)  <=34 U/L Final    ALT 05/26/2022 81 (H)  10 - 49 U/L Final    Alkaline Phosphatase 05/26/2022 79  46 - 116 U/L Final    LDH 05/26/2022 455 (H)  120 - 246 U/L Final    CMV Viral Ld 05/26/2022 Not Detected  Not Detected Final    WBC 05/26/2022 2.7 (L)  3.6 - 11.2 10*9/L Final    RBC 05/26/2022 2.76 (L)  3.95 - 5.13 10*12/L Final    HGB 05/26/2022 10.0 (L)  11.3 - 14.9 g/dL Final    HCT 54/04/8118 28.5 (L)  34.0 - 44.0 % Final    MCV 05/26/2022 103.2 (H)  77.6 - 95.7 fL Final    MCH 05/26/2022 36.1 (H)  25.9 - 32.4 pg Final    MCHC 05/26/2022 35.0  32.0 - 36.0 g/dL Final    RDW 14/78/2956 22.1 (H)  12.2 - 15.2 % Final    MPV 05/26/2022 6.8  6.8 - 10.7 fL Final    Platelet 05/26/2022 125 (L)  150 - 450 10*9/L Final    Neutrophils % 05/26/2022 48.9  % Final    Lymphocytes % 05/26/2022 38.4  % Final    Monocytes % 05/26/2022 11.7  % Final    Eosinophils % 05/26/2022 0.5  % Final    Basophils % 05/26/2022 0.5  % Final    Absolute Neutrophils 05/26/2022 1.3 (L)  1.8 - 7.8 10*9/L Final    Absolute Lymphocytes 05/26/2022 1.0 (L)  1.1 - 3.6 10*9/L Final    Absolute Monocytes 05/26/2022 0.3  0.3 - 0.8 10*9/L Final    Absolute Eosinophils 05/26/2022 0.0  0.0 - 0.5 10*9/L Final    Absolute Basophils 05/26/2022 0.0  0.0 - 0.1 10*9/L Final    Macrocytosis 05/26/2022 Slight (A)  Not Present Final    Anisocytosis 05/26/2022 Marked (A)  Not Present Final    Smear Review Comments 05/26/2022 See Comment (A)  Undefined Final    Slide Reviewed. Instrument ID [id number]  Myelocytes present - rare.   There may be more visits with results that are not included.

## 2022-07-04 ENCOUNTER — Ambulatory Visit: Admit: 2022-07-04 | Discharge: 2022-07-05 | Payer: PRIVATE HEALTH INSURANCE

## 2022-07-04 ENCOUNTER — Institutional Professional Consult (permissible substitution): Admit: 2022-07-04 | Discharge: 2022-07-05 | Payer: PRIVATE HEALTH INSURANCE

## 2022-07-04 DIAGNOSIS — B259 Cytomegaloviral disease, unspecified: Principal | ICD-10-CM

## 2022-07-04 DIAGNOSIS — C9100 Acute lymphoblastic leukemia not having achieved remission: Principal | ICD-10-CM

## 2022-07-04 DIAGNOSIS — E876 Hypokalemia: Principal | ICD-10-CM

## 2022-07-04 LAB — SLIDE REVIEW

## 2022-07-04 LAB — COMPREHENSIVE METABOLIC PANEL
ALBUMIN: 3.7 g/dL (ref 3.4–5.0)
ALKALINE PHOSPHATASE: 79 U/L (ref 46–116)
ALT (SGPT): 102 U/L — ABNORMAL HIGH (ref 10–49)
ANION GAP: 11 mmol/L (ref 5–14)
AST (SGOT): 61 U/L — ABNORMAL HIGH (ref <=34)
BILIRUBIN TOTAL: 0.7 mg/dL (ref 0.3–1.2)
BLOOD UREA NITROGEN: 17 mg/dL (ref 9–23)
BUN / CREAT RATIO: 24
CALCIUM: 8.7 mg/dL (ref 8.7–10.4)
CHLORIDE: 108 mmol/L — ABNORMAL HIGH (ref 98–107)
CO2: 23 mmol/L (ref 20.0–31.0)
CREATININE: 0.71 mg/dL
EGFR CKD-EPI (2021) FEMALE: >90 mL/min/{1.73_m2} (ref >=60)
GLUCOSE RANDOM: 157 mg/dL (ref 70–179)
POTASSIUM: 3.1 mmol/L — ABNORMAL LOW (ref 3.4–4.8)
PROTEIN TOTAL: 5.6 g/dL — ABNORMAL LOW (ref 5.7–8.2)
SODIUM: 142 mmol/L (ref 135–145)

## 2022-07-04 LAB — CBC W/ AUTO DIFF
BASOPHILS ABSOLUTE COUNT: 0 10*9/L (ref 0.0–0.1)
BASOPHILS RELATIVE PERCENT: 0.1 %
EOSINOPHILS ABSOLUTE COUNT: 0 10*9/L (ref 0.0–0.5)
EOSINOPHILS RELATIVE PERCENT: 0.8 %
HEMATOCRIT: 25.2 % — ABNORMAL LOW (ref 34.0–44.0)
HEMOGLOBIN: 8.6 g/dL — ABNORMAL LOW (ref 11.3–14.9)
LYMPHOCYTES ABSOLUTE COUNT: 1.2 10*9/L (ref 1.1–3.6)
LYMPHOCYTES RELATIVE PERCENT: 53.2 %
MEAN CORPUSCULAR HEMOGLOBIN CONC: 34.3 g/dL (ref 32.0–36.0)
MEAN CORPUSCULAR HEMOGLOBIN: 36.7 pg — ABNORMAL HIGH (ref 25.9–32.4)
MEAN CORPUSCULAR VOLUME: 106.8 fL — ABNORMAL HIGH (ref 77.6–95.7)
MEAN PLATELET VOLUME: 7.3 fL (ref 6.8–10.7)
MONOCYTES ABSOLUTE COUNT: 0.4 10*9/L (ref 0.3–0.8)
MONOCYTES RELATIVE PERCENT: 20.1 %
NEUTROPHILS ABSOLUTE COUNT: 0.6 10*9/L — ABNORMAL LOW (ref 1.8–7.8)
NEUTROPHILS RELATIVE PERCENT: 25.8 %
NUCLEATED RED BLOOD CELLS: 2 /100{WBCs} (ref <=4)
PLATELET COUNT: 140 10*9/L — ABNORMAL LOW (ref 150–450)
RED BLOOD CELL COUNT: 2.35 10*12/L — ABNORMAL LOW (ref 3.95–5.13)
RED CELL DISTRIBUTION WIDTH: 20.2 % — ABNORMAL HIGH (ref 12.2–15.2)
WBC ADJUSTED: 2.2 10*9/L — ABNORMAL LOW (ref 3.6–11.2)

## 2022-07-04 LAB — LACTATE DEHYDROGENASE: LACTATE DEHYDROGENASE: 543 U/L — ABNORMAL HIGH (ref 120–246)

## 2022-07-04 MED ADMIN — potassium chloride 20 mEq in 100 mL IVPB Premix: 20 meq | INTRAVENOUS | @ 17:00:00 | Stop: 2022-07-04

## 2022-07-04 MED ADMIN — acetaminophen (TYLENOL) tablet 650 mg: 650 mg | ORAL | @ 15:00:00 | Stop: 2022-07-04

## 2022-07-04 MED ADMIN — sodium chloride (NS) 0.9 % infusion: 50 mL/h | INTRAVENOUS | @ 15:00:00 | Stop: 2022-07-04

## 2022-07-04 MED ADMIN — heparin, porcine (PF) 100 unit/mL injection 500 Units: 500 [IU] | INTRAVENOUS | @ 13:00:00 | Stop: 2022-07-04

## 2022-07-04 MED ADMIN — potassium chloride (KLOR-CON) packet 40 mEq: 40 meq | ORAL | @ 15:00:00 | Stop: 2022-07-04

## 2022-07-04 NOTE — Unmapped (Signed)
Patient arrived to infusion clinic in stable, ambulatory condition for possible transfusion of PRBCs. VS and weight obtained. Port accessed and labs obtained in prior RN visit. Labs reviewed and resulted slightly above parameters for treatment today with Hgb 8.6 and hematocrit 25.2. However, patient stated that she feels weak and tired. APP Larry Sierras notified and 1 unit of PRBC ordered. Patient requested Tylenol for mild headache prior to treatment. Approved and released from therapy plan. Pre-treatment blood return verified and patient administered 1 unit of PRBC as ordered. Potassium level low at 3.1. APP notified and potassium replacement plan initiated. 40 mEq oral potassium and 20 mEq potassium IV administered as ordered. Post-treatment blood return verified. Patient tolerated all therapies well without adverse effect noted while in clinic today. Port flushed and de-accessed per protocol, heparin locked, and gauze/Band-Aid dressing applied. AVS declined. Patient discharged home to self care in stable condition.

## 2022-07-05 ENCOUNTER — Institutional Professional Consult (permissible substitution): Admit: 2022-07-05 | Payer: PRIVATE HEALTH INSURANCE

## 2022-07-05 LAB — CMV DNA, QUANTITATIVE, PCR
CMV QUANT LOG(10): 2.47 {Log_IU}/mL — ABNORMAL HIGH (ref <0.00)
CMV QUANT: 294 [IU]/mL — ABNORMAL HIGH (ref <0)
CMV VIRAL LD: DETECTED — AB

## 2022-07-05 MED ORDER — VALGANCICLOVIR 450 MG TABLET
ORAL_TABLET | Freq: Two times a day (BID) | ORAL | 2 refills | 30 days | Status: CP
Start: 2022-07-05 — End: ?
  Filled 2022-07-07: qty 120, 30d supply, fill #0

## 2022-07-06 NOTE — Unmapped (Signed)
I called Patricia [name] to discuss our recommendations to start IVIG therapy and to start CMV directed therapy with Valcyte.    Briefly, Patricia [name] has been struggling with recurring fevers and episodes of neutropenia. 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 However there have been slight rises in her CMV levels typically following prednisone bursts as part of her maintenance chemotherapy resulting in periods of neutropenia and fevers. Theses episodes are complicating her care because they sometimes provoke FN admission/work-up and also have resulted in holding oral chemo at times. At this point in her therapy (maintenance with pred/VCR/dasatinib) we do not expect neutropenia, but we've been limited by these episodes and haven't been able to increase her dose of dasatinib to target maintenance dose of 100 mg.     Therefore, with the help of ICID team, it was decided that we would treat her CMV and then drop to CMV ppx once levels drop < 100. 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 We discussed that Valcyte can also induce cytopenias, but the hope is that this can be mitigated once we can reduce the dose. IVIG should help prevent infections, most commonly URI's and these prolonged symptoms which she has struggled with during treatment.     Plan:  - START Valcyte 900 mg BID. Rx sent to [facility].  - Continue weekly CMV levels, once < 100, can drop Valcyte to 450 mg BID  - STOP valtrex while on valcyte  - START IVIG monthly to align with VCR monthly infusions  - Continue maintenance therapy, once counts remain stable, I recommend to increase dasatinib dose to 100 mg daily as typical target for maintenance chemo      Manfred Arch, PharmD, BCOP, CPP  Pager: [phone]

## 2022-07-06 NOTE — Unmapped (Signed)
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  I am speaking to the patient.    Was a Nurse, learning disability used for this call? No    Verified patient's date of birth / HIPAA.    Specialty medication(s) to be sent: Hematology/Oncology: Sprycel 70 mg and valganciclovir    Non-specialty medications/supplies to be sent: none    Medications not needed at this time: none     Valcyte (valganciclovir)    The patient declined counseling on medication administration, missed dose instructions, goals of therapy, side effects and monitoring parameters, warnings and precautions, drug/food interactions, and storage, handling precautions, and disposal because they were counseled in clinic. The information in the declined sections below are for informational purposes only and was not discussed with patient.     Medication & Administration     Dosage:   Take 2 tablets (900 mg total) by mouth two (2) times a day.    Administration:   Take with food  Swallow the pills whole, do not break, crush, or chew    Adherence/Missed dose instructions:  Take a missed dose as soon as you think about it with food  If it is close to your next dose, skip the missed dose and go back to your normal time.  Do not take 2 doses at the same time or extra doses.  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    Side Effects & Monitoring Parameters   Common side effects  Headache  Diarrhea or constipation  Appetite or sleep disturbances  Back, muscle, joint, or belly pain  Weight loss  Dizziness  Muscle spasm  Upset stomach or vomiting    The following side effects should be reported to the provider:  Allergic reaction  (rash, hives, swelling, blistered or peeling skin, shortness of breath)  Infection (fever, chills, sore throat, ear/sinus pain, cough, sputum change, urinary pain, mouth sores, non-healing wounds)  Bleeding (cough ground vomit, blood in urine, black/red/tarry stools, unexplained bruising or bleeding)  Electrolyte problems (mood changes, confusion, weakness, abnormal heartbeat, seizures)  Kidney problems (urine changes, weight gain)  Yellowing skin or eyes  Swelling in arms, legs, stomach  Severe dizziness or passing out  Eye issues (eyesight changes, pain, or irritation)  Night sweats    Monitoring parameters  Have eye exam as directed by doctor  CMV counts  CBC  Renal function  Pregnancy test prior to initiation    Contraindications, Warnings, & Precautions   BBW: severe leukopenia, neutropenia, anemia, thrombocytopenia, pancytopenia, and bone marrow failure, including aplastic anemia have been reported  BBW: may cause temporary or permanent inhibition of spermatogenesis and suppression of fertilty; has the potential to cause birth defects and cancers in humans  Female patients should have pregnancy test prior to initiation and use birth control for at least 30 days after discontinuation  Female patients should use a barrier contraceptive while on therapy and for 90 days after discontinuation  Acute renal failure  Not indicated for use in liver transplant recipients  Breastfeeding is not recommended    Drug/Food Interactions   Medication list reviewed in Epic. The patient was instructed to inform the care team before taking any new medications or supplements. No drug interactions identified.  Check with your doctor before getting any vaccinations (live or inactivated)    Storage, Handling Precautions, & Disposal   Store at room temperature  Keep away from children and pets    Current Medications (including OTC/herbals), Comorbidities and Allergies     Current Outpatient Medications   Medication Sig Dispense Refill    acetaminophen (TYLENOL 8 HOUR) 650 MG CR tablet Take 2 tablets (1,300 mg total) by mouth every eight (8) hours as needed for pain.      albuterol HFA 90 mcg/actuation inhaler Inhale 2 puffs every six (6) hours as needed for wheezing.      cetirizine (ZYRTEC) 10 MG tablet Take 1 tablet (10 mg total) by mouth nightly. 30 tablet 2    dapsone 100 MG tablet TAKE 1 TABLET(100 MG) BY MOUTH DAILY 30 tablet 11    dasatinib (SPRYCEL) 70 MG tablet Take 1 tablet (70 mg total) by mouth daily. 30 tablet 7    escitalopram oxalate (LEXAPRO) 10 MG tablet Take 1 tablet (10 mg total) by mouth daily.      fluconazole (DIFLUCAN) 200 MG tablet Take 1 tablet (200 mg total) by mouth daily.      levalbuterol (XOPENEX) 0.31 mg/3 mL nebulizer solution Inhale 3 mL (0.31 mg total) by nebulization every four (4) hours as needed for wheezing (cough). 45 mL 0    levoFLOXacin (LEVAQUIN) 500 MG tablet Take 1 tablet (500 mg total) by mouth daily.      magnesium oxide (MAG-OX) 400 mg (241.3 mg elemental magnesium) tablet Take 1 tablet (400 mg total) by mouth daily. 30 tablet 11    oxyCODONE (ROXICODONE) 5 MG immediate release tablet Take 1-2 tablets (5-10 mg total) by mouth every four (4) hours as needed for pain (Patient not taking: Reported on 06/30/2022)      predniSONE (DELTASONE) 50 MG tablet Take 4 tablets (200 mg total) by mouth once daily on days 1-5 of each cycle. 20 tablet 5    riboflavin, vitamin B2, 400 mg Tab Take 400 mg by mouth in the morning. 30 tablet 6    tizanidine (ZANAFLEX) 4 MG tablet Take 1 tablet (4 mg total) by mouth nightly. 90 tablet 2    triamcinolone (KENALOG) 0.1 % cream Apply topically two (2) times a day for 14 days. 45 g 0    valGANciclovir (VALCYTE) 450 mg tablet Take 2 tablets (900 mg total) by mouth two (2) times a day. 120 tablet 2     No current facility-administered medications for this visit.       Allergies   Allergen Reactions    Erythromycin Hives     Other reaction(s): Not available    Other      Pt cant take ibuprofen due to condition.    Sulfa (Sulfonamide Antibiotics) Anaphylaxis     Other reaction(s): Not available    Azithromycin        Patient Active Problem List   Diagnosis    Leukocytosis    B-cell acute lymphoblastic leukemia (ALL) (CMS-HCC)    Acute cough    Dyspepsia    Acute frontal sinusitis    Pancytopenia (CMS-HCC)    Fever and neutropenia (CMS-HCC)    Blurred vision, bilateral    Anxiety    Hypogammaglobulinemia (CMS-HCC)    Hypokalemia       Reviewed and up to date in Epic.    Appropriateness of Therapy     Acute infections noted within Epic:  No active infections  Patient reported infection:  Cytomegalovirus    Is medication and dose appropriate based on diagnosis and infection status? Yes    Prescription has been clinically reviewed: Yes      Baseline Quality of Life Assessment      How many days over the past month did your CMV infection and B-cell ALL  keep you from your normal activities? For example, brushing your teeth or getting up in the morning. 0    Financial Information     Medication Assistance provided: None Required    Anticipated copay of $0 / 30 days reviewed with patient. Verified delivery address.    Delivery Information     Scheduled delivery date: 07/08/22    Expected start date: once received    Medication will be delivered via Next Day Courier to the prescription address in [facility].  This shipment will not require a signature.      Explained the services we provide at [facility] and that each month we would call to set up refills.  Stressed importance of returning phone calls so that we could ensure they receive their medications in time each month.  Informed patient that we should be setting up refills 7-10 days prior to when they will run out of medication.  A pharmacist will reach out to perform a clinical assessment periodically.  Informed patient that a welcome packet, containing information about our pharmacy and other support services, a Notice of Privacy Practices, and a drug information handout will be sent.      The patient or caregiver noted above participated in the development of this care plan and knows that they can request review of or adjustments to the care plan at any time.      Patient or caregiver verbalized understanding of the above information as well as how to contact the pharmacy at [phone] option 4 with any questions/concerns.  The pharmacy is open Monday through Friday 8:30am-4:30pm.  A pharmacist is available 24/7 via pager to answer any clinical questions they may have.    Patient Specific Needs     Does the patient have any physical, cognitive, or cultural barriers? No    Does the patient have adequate living arrangements? (i.e. the ability to store and take their medication appropriately) Yes    Did you identify any home environmental safety or security hazards? No    Patient prefers to have medications discussed with  Patient     Is the patient or caregiver able to read and understand education materials at a high school level or above? Yes    Patient's primary language is  English     Is the patient high risk? No    SOCIAL DETERMINANTS OF HEALTH     At the [facility], we have learned that life circumstances - like trouble affording food, housing, utilities, or transportation can affect the health of many of our patients.   That is why we wanted to ask: are you currently experiencing any life circumstances that are negatively impacting your health and/or quality of life? Patient declined to answer    Social Determinants of Health     Financial Resource Strain: Low Risk  (11/02/2021)    Overall Financial Resource Strain (CARDIA)     Difficulty of Paying Living Expenses: Not hard at all   Internet Connectivity: Not on file   Food Insecurity: No Food Insecurity (11/02/2021)    Hunger Vital Sign     Worried About Running Out of Food  in the Last Year: Never true     Ran Out of Food in the Last Year: Never true   Tobacco Use: Medium Risk (06/30/2022)    Patient History     Smoking Tobacco Use: Former     Smokeless Tobacco Use: Never     Passive Exposure: Not on file   Housing/Utilities: Low Risk  (11/02/2021)    Housing/Utilities     Within the past 12 months, have you ever stayed: outside, in a car, in a tent, in an overnight shelter, or temporarily in someone else's home (i.e. couch-surfing)?: No     Are you worried about losing your housing?: No     Within the past 12 months, have you been unable to get utilities (heat, electricity) when it was really needed?: No   Alcohol Use: Not on file   Transportation Needs: No Transportation Needs (11/02/2021)    PRAPARE - Therapist, art (Medical): No     Lack of Transportation (Non-Medical): No   Substance Use: Not on file   Health Literacy: Not on file   Physical Activity: Not on file   Interpersonal Safety: Not on file   Stress: Not on file   Intimate Partner Violence: Not on file   Depression: Not on file   Social Connections: Not on file       Would you be willing to receive help with any of the needs that you have identified today? Not applicable       Kermit Balo, [facility]  [facility] Specialty Pharmacist

## 2022-07-06 NOTE — Unmapped (Signed)
[facility] Specialty Medication Onboarding    Specialty Medication: Valganciclovir 450mg tablet  Prior Authorization: Not Required   Financial Assistance: No - copay  <$25  Final Copay/Day Supply: $0 / 30 days    Insurance Restrictions: None     Notes to Pharmacist: N/A    The triage team has completed the benefits investigation and has determined that the patient is able to fill this medication at [facility]. Please contact the patient to complete the onboarding or follow up with the prescribing physician as needed.

## 2022-07-07 MED FILL — SPRYCEL 70 MG TABLET: ORAL | 30 days supply | Qty: 30 | Fill #4

## 2022-07-08 ENCOUNTER — Ambulatory Visit: Admit: 2022-07-08 | Discharge: 2022-07-09 | Payer: PRIVATE HEALTH INSURANCE

## 2022-07-11 ENCOUNTER — Ambulatory Visit: Admit: 2022-07-11 | Discharge: 2022-07-11 | Payer: PRIVATE HEALTH INSURANCE

## 2022-07-11 ENCOUNTER — Institutional Professional Consult (permissible substitution): Admit: 2022-07-11 | Discharge: 2022-07-11 | Payer: PRIVATE HEALTH INSURANCE

## 2022-07-11 DIAGNOSIS — C9100 Acute lymphoblastic leukemia not having achieved remission: Principal | ICD-10-CM

## 2022-07-11 DIAGNOSIS — B259 Cytomegaloviral disease, unspecified: Principal | ICD-10-CM

## 2022-07-11 LAB — COMPREHENSIVE METABOLIC PANEL
ALBUMIN: 3.7 g/dL (ref 3.4–5.0)
ALKALINE PHOSPHATASE: 96 U/L (ref 46–116)
ALT (SGPT): 107 U/L — ABNORMAL HIGH (ref 10–49)
ANION GAP: 8 mmol/L (ref 5–14)
AST (SGOT): 56 U/L — ABNORMAL HIGH (ref <=34)
BILIRUBIN TOTAL: 0.7 mg/dL (ref 0.3–1.2)
BLOOD UREA NITROGEN: 16 mg/dL (ref 9–23)
BUN / CREAT RATIO: 21
CALCIUM: 9.1 mg/dL (ref 8.7–10.4)
CHLORIDE: 106 mmol/L (ref 98–107)
CO2: 26.3 mmol/L (ref 20.0–31.0)
CREATININE: 0.75 mg/dL
EGFR CKD-EPI (2021) FEMALE: >90 mL/min/{1.73_m2} (ref >=60)
GLUCOSE RANDOM: 168 mg/dL (ref 70–179)
POTASSIUM: 3.9 mmol/L (ref 3.4–4.8)
PROTEIN TOTAL: 6 g/dL (ref 5.7–8.2)
SODIUM: 140 mmol/L (ref 135–145)

## 2022-07-11 LAB — CBC W/ AUTO DIFF
BASOPHILS ABSOLUTE COUNT: 0 10*9/L (ref 0.0–0.1)
BASOPHILS RELATIVE PERCENT: 0.7 %
EOSINOPHILS ABSOLUTE COUNT: 0.1 10*9/L (ref 0.0–0.5)
EOSINOPHILS RELATIVE PERCENT: 2.9 %
HEMATOCRIT: 28.2 % — ABNORMAL LOW (ref 34.0–44.0)
HEMOGLOBIN: 9.7 g/dL — ABNORMAL LOW (ref 11.3–14.9)
LYMPHOCYTES ABSOLUTE COUNT: 1.1 10*9/L (ref 1.1–3.6)
LYMPHOCYTES RELATIVE PERCENT: 58 %
MEAN CORPUSCULAR HEMOGLOBIN CONC: 34.4 g/dL (ref 32.0–36.0)
MEAN CORPUSCULAR HEMOGLOBIN: 36 pg — ABNORMAL HIGH (ref 25.9–32.4)
MEAN CORPUSCULAR VOLUME: 104.7 fL — ABNORMAL HIGH (ref 77.6–95.7)
MEAN PLATELET VOLUME: 7.2 fL (ref 6.8–10.7)
MONOCYTES ABSOLUTE COUNT: 0.5 10*9/L (ref 0.3–0.8)
MONOCYTES RELATIVE PERCENT: 25.5 %
NEUTROPHILS ABSOLUTE COUNT: 0.3 10*9/L — CL (ref 1.8–7.8)
NEUTROPHILS RELATIVE PERCENT: 12.9 %
NUCLEATED RED BLOOD CELLS: 0 /100{WBCs} (ref <=4)
PLATELET COUNT: 151 10*9/L (ref 150–450)
RED BLOOD CELL COUNT: 2.69 10*12/L — ABNORMAL LOW (ref 3.95–5.13)
RED CELL DISTRIBUTION WIDTH: 20.5 % — ABNORMAL HIGH (ref 12.2–15.2)
WBC ADJUSTED: 1.9 10*9/L — ABNORMAL LOW (ref 3.6–11.2)

## 2022-07-11 LAB — LACTATE DEHYDROGENASE: LACTATE DEHYDROGENASE: 500 U/L — ABNORMAL HIGH (ref 120–246)

## 2022-07-11 MED ADMIN — heparin, porcine (PF) 100 unit/mL injection 500 Units: 500 [IU] | INTRAVENOUS | @ 16:00:00 | Stop: 2022-07-11

## 2022-07-11 NOTE — Unmapped (Signed)
Clinical Support on 07/11/2022   Component Date Value Ref Range Status    Sodium 07/11/2022 140  135 - 145 mmol/L Final    Potassium 07/11/2022 3.9  3.4 - 4.8 mmol/L Final    Chloride 07/11/2022 106  98 - 107 mmol/L Final    CO2 07/11/2022 26.3  20.0 - 31.0 mmol/L Final    Anion Gap 07/11/2022 8  5 - 14 mmol/L Final    BUN 07/11/2022 16  9 - 23 mg/dL Final    Creatinine 16/05/9603 0.75  0.55 - 1.02 mg/dL Final    BUN/Creatinine Ratio 07/11/2022 21   Final    eGFR CKD-EPI (2021) Female 07/11/2022 >90  >=60 mL/min/1.71m2 Final    eGFR calculated with CKD-EPI 2021 equation in accordance with [employer] and [employer] of Nephrology Task Force recommendations.    Glucose 07/11/2022 168  70 - 179 mg/dL Final    Calcium 54/04/8118 9.1  8.7 - 10.4 mg/dL Final    Albumin 14/78/2956 3.7  3.4 - 5.0 g/dL Final    Total Protein 07/11/2022 6.0  5.7 - 8.2 g/dL Final    Total Bilirubin 07/11/2022 0.7  0.3 - 1.2 mg/dL Final    AST 21/30/8657 56 (H)  <=34 U/L Final    ALT 07/11/2022 107 (H)  10 - 49 U/L Final    Alkaline Phosphatase 07/11/2022 96  46 - 116 U/L Final    LDH 07/11/2022 500 (H)  120 - 246 U/L Final    WBC 07/11/2022 1.9 (L)  3.6 - 11.2 10*9/L Final    RBC 07/11/2022 2.69 (L)  3.95 - 5.13 10*12/L Final    HGB 07/11/2022 9.7 (L)  11.3 - 14.9 g/dL Final    HCT 84/69/6295 28.2 (L)  34.0 - 44.0 % Final    MCV 07/11/2022 104.7 (H)  77.6 - 95.7 fL Final    MCH 07/11/2022 36.0 (H)  25.9 - 32.4 pg Final    MCHC 07/11/2022 34.4  32.0 - 36.0 g/dL Final    RDW 28/41/3244 20.5 (H)  12.2 - 15.2 % Final    MPV 07/11/2022 7.2  6.8 - 10.7 fL Final    Platelet 07/11/2022 151  150 - 450 10*9/L Final    nRBC 07/11/2022 0  <=4 /100 WBCs Final    Neutrophils % 07/11/2022 12.9  % Final    Lymphocytes % 07/11/2022 58.0  % Final    Monocytes % 07/11/2022 25.5  % Final    Eosinophils % 07/11/2022 2.9  % Final    Basophils % 07/11/2022 0.7  % Final    Absolute Neutrophils 07/11/2022 0.3 (LL)  1.8 - 7.8 10*9/L Final Absolute Lymphocytes 07/11/2022 1.1  1.1 - 3.6 10*9/L Final    Absolute Monocytes 07/11/2022 0.5  0.3 - 0.8 10*9/L Final    Absolute Eosinophils 07/11/2022 0.1  0.0 - 0.5 10*9/L Final    Absolute Basophils 07/11/2022 0.0  0.0 - 0.1 10*9/L Final    Anisocytosis 07/11/2022 Marked (A)  Not Present Final

## 2022-07-11 NOTE — Unmapped (Signed)
Patient arrived in the infusion clinic at 1031.  Weight and Vitals were obtained. Prior to infusion visit the patient had a nurse visit where their port was accessed, flushed with blood return, dressing clean, dry, and intact.  Labs were drawn and resulted. HGB 9.7 PLT 151. No transfusion needed this visit.   Port was flushed with blood return, heparin locked, then de-accessed area covered with 2x2 gauze and ban-aid.  Patient received printed after visit summary then discharged home to self care.

## 2022-07-12 LAB — CMV DNA, QUANTITATIVE, PCR
CMV QUANT LOG(10): 2.05 {Log_IU}/mL — ABNORMAL HIGH (ref <0.00)
CMV QUANT: 113 [IU]/mL — ABNORMAL HIGH (ref <0)
CMV VIRAL LD: DETECTED — AB

## 2022-07-16 MED ORDER — ESCITALOPRAM 10 MG TABLET
ORAL_TABLET | Freq: Every day | ORAL | 0 refills | 90 days
Start: 2022-07-16 — End: ?

## 2022-07-18 ENCOUNTER — Ambulatory Visit: Admit: 2022-07-18 | Discharge: 2022-07-18 | Payer: PRIVATE HEALTH INSURANCE

## 2022-07-18 ENCOUNTER — Institutional Professional Consult (permissible substitution): Admit: 2022-07-18 | Discharge: 2022-07-18 | Payer: PRIVATE HEALTH INSURANCE

## 2022-07-18 DIAGNOSIS — C9100 Acute lymphoblastic leukemia not having achieved remission: Principal | ICD-10-CM

## 2022-07-18 DIAGNOSIS — B259 Cytomegaloviral disease, unspecified: Principal | ICD-10-CM

## 2022-07-18 LAB — COMPREHENSIVE METABOLIC PANEL
ALBUMIN: 3.9 g/dL (ref 3.4–5.0)
ALKALINE PHOSPHATASE: 80 U/L (ref 46–116)
ALT (SGPT): 69 U/L — ABNORMAL HIGH (ref 10–49)
ANION GAP: 6 mmol/L (ref 5–14)
AST (SGOT): 45 U/L — ABNORMAL HIGH (ref <=34)
BILIRUBIN TOTAL: 0.6 mg/dL (ref 0.3–1.2)
BLOOD UREA NITROGEN: 15 mg/dL (ref 9–23)
BUN / CREAT RATIO: 19
CALCIUM: 9.5 mg/dL (ref 8.7–10.4)
CHLORIDE: 111 mmol/L — ABNORMAL HIGH (ref 98–107)
CO2: 24.8 mmol/L (ref 20.0–31.0)
CREATININE: 0.81 mg/dL
EGFR CKD-EPI (2021) FEMALE: >90 mL/min/{1.73_m2} (ref >=60)
GLUCOSE RANDOM: 93 mg/dL (ref 70–179)
POTASSIUM: 3.8 mmol/L (ref 3.4–4.8)
PROTEIN TOTAL: 6.2 g/dL (ref 5.7–8.2)
SODIUM: 142 mmol/L (ref 135–145)

## 2022-07-18 LAB — CBC W/ AUTO DIFF
BASOPHILS ABSOLUTE COUNT: 0 10*9/L (ref 0.0–0.1)
BASOPHILS RELATIVE PERCENT: 0.7 %
EOSINOPHILS ABSOLUTE COUNT: 0 10*9/L (ref 0.0–0.5)
EOSINOPHILS RELATIVE PERCENT: 2.1 %
HEMATOCRIT: 27.2 % — ABNORMAL LOW (ref 34.0–44.0)
HEMOGLOBIN: 9.4 g/dL — ABNORMAL LOW (ref 11.3–14.9)
LYMPHOCYTES ABSOLUTE COUNT: 1.2 10*9/L (ref 1.1–3.6)
LYMPHOCYTES RELATIVE PERCENT: 66.3 %
MEAN CORPUSCULAR HEMOGLOBIN CONC: 34.5 g/dL (ref 32.0–36.0)
MEAN CORPUSCULAR HEMOGLOBIN: 37.1 pg — ABNORMAL HIGH (ref 25.9–32.4)
MEAN CORPUSCULAR VOLUME: 107.4 fL — ABNORMAL HIGH (ref 77.6–95.7)
MEAN PLATELET VOLUME: 6.2 fL — ABNORMAL LOW (ref 6.8–10.7)
MONOCYTES ABSOLUTE COUNT: 0.1 10*9/L — ABNORMAL LOW (ref 0.3–0.8)
MONOCYTES RELATIVE PERCENT: 5 %
NEUTROPHILS ABSOLUTE COUNT: 0.5 10*9/L — ABNORMAL LOW (ref 1.8–7.8)
NEUTROPHILS RELATIVE PERCENT: 25.9 %
NUCLEATED RED BLOOD CELLS: 0 /100{WBCs} (ref <=4)
PLATELET COUNT: 177 10*9/L (ref 150–450)
RED BLOOD CELL COUNT: 2.53 10*12/L — ABNORMAL LOW (ref 3.95–5.13)
RED CELL DISTRIBUTION WIDTH: 20.4 % — ABNORMAL HIGH (ref 12.2–15.2)
WBC ADJUSTED: 1.8 10*9/L — ABNORMAL LOW (ref 3.6–11.2)

## 2022-07-18 LAB — CMV DNA, QUANTITATIVE, PCR
CMV QUANT LOG(10): 1.88 {Log_IU}/mL — ABNORMAL HIGH (ref <0.00)
CMV QUANT: 75 [IU]/mL — ABNORMAL HIGH (ref <0)
CMV VIRAL LD: DETECTED — AB

## 2022-07-18 LAB — LACTATE DEHYDROGENASE: LACTATE DEHYDROGENASE: 438 U/L — ABNORMAL HIGH (ref 120–246)

## 2022-07-18 MED ORDER — ESCITALOPRAM 10 MG TABLET
ORAL_TABLET | Freq: Every day | ORAL | 0 refills | 90 days | Status: CP
Start: 2022-07-18 — End: ?

## 2022-07-18 MED ADMIN — heparin, porcine (PF) 100 unit/mL injection 500 Units: 500 [IU] | INTRAVENOUS | @ 13:00:00 | Stop: 2022-07-18

## 2022-07-18 NOTE — Unmapped (Signed)
Lexapro refill requested.

## 2022-07-18 NOTE — Unmapped (Signed)
Clinical Support on 07/18/2022   Component Date Value Ref Range Status    WBC 07/18/2022 1.8 (L)  3.6 - 11.2 10*9/L Final    RBC 07/18/2022 2.53 (L)  3.95 - 5.13 10*12/L Final    HGB 07/18/2022 9.4 (L)  11.3 - 14.9 g/dL Final    HCT 16/05/9603 27.2 (L)  34.0 - 44.0 % Final    MCV 07/18/2022 107.4 (H)  77.6 - 95.7 fL Final    MCH 07/18/2022 37.1 (H)  25.9 - 32.4 pg Final    MCHC 07/18/2022 34.5  32.0 - 36.0 g/dL Final    RDW 54/04/8118 20.4 (H)  12.2 - 15.2 % Final    MPV 07/18/2022 6.2 (L)  6.8 - 10.7 fL Final    Platelet 07/18/2022 177  150 - 450 10*9/L Final    nRBC 07/18/2022 0  <=4 /100 WBCs Final    Neutrophils % 07/18/2022 25.9  % Final    Lymphocytes % 07/18/2022 66.3  % Final    Monocytes % 07/18/2022 5.0  % Final    Eosinophils % 07/18/2022 2.1  % Final    Basophils % 07/18/2022 0.7  % Final    Absolute Neutrophils 07/18/2022 0.5 (L)  1.8 - 7.8 10*9/L Final    Absolute Lymphocytes 07/18/2022 1.2  1.1 - 3.6 10*9/L Final    Absolute Monocytes 07/18/2022 0.1 (L)  0.3 - 0.8 10*9/L Final    Absolute Eosinophils 07/18/2022 0.0  0.0 - 0.5 10*9/L Final    Absolute Basophils 07/18/2022 0.0  0.0 - 0.1 10*9/L Final    Anisocytosis 07/18/2022 Marked (A)  Not Present Final

## 2022-07-18 NOTE — Unmapped (Signed)
Clinical Support on 07/18/2022   Component Date Value Ref Range Status    WBC 07/18/2022 1.8 (L)  3.6 - 11.2 10*9/L Final    RBC 07/18/2022 2.53 (L)  3.95 - 5.13 10*12/L Final    HGB 07/18/2022 9.4 (L)  11.3 - 14.9 g/dL Final    HCT 86/57/846912/18/2023 27.2 (L)  34.0 - 44.0 % Final    MCV 07/18/2022 107.4 (H)  77.6 - 95.7 fL Final    MCH 07/18/2022 37.1 (H)  25.9 - 32.4 pg Final    MCHC 07/18/2022 34.5  32.0 - 36.0 g/dL Final    RDW 62/95/284112/18/2023 20.4 (H)  12.2 - 15.2 % Final    MPV 07/18/2022 6.2 (L)  6.8 - 10.7 fL Final    Platelet 07/18/2022 177  150 - 450 10*9/L Final    nRBC 07/18/2022 0  <=4 /100 WBCs Final    Neutrophils % 07/18/2022 25.9  % Final    Lymphocytes % 07/18/2022 66.3  % Final    Monocytes % 07/18/2022 5.0  % Final    Eosinophils % 07/18/2022 2.1  % Final    Basophils % 07/18/2022 0.7  % Final    Absolute Neutrophils 07/18/2022 0.5 (L)  1.8 - 7.8 10*9/L Final    Absolute Lymphocytes 07/18/2022 1.2  1.1 - 3.6 10*9/L Final    Absolute Monocytes 07/18/2022 0.1 (L)  0.3 - 0.8 10*9/L Final    Absolute Eosinophils 07/18/2022 0.0  0.0 - 0.5 10*9/L Final    Absolute Basophils 07/18/2022 0.0  0.0 - 0.1 10*9/L Final    Anisocytosis 07/18/2022 Marked (A)  Not Present Final

## 2022-07-19 ENCOUNTER — Ambulatory Visit
Admit: 2022-07-19 | Discharge: 2022-07-20 | Payer: PRIVATE HEALTH INSURANCE | Attending: Student in an Organized Health Care Education/Training Program | Primary: Student in an Organized Health Care Education/Training Program

## 2022-07-19 DIAGNOSIS — Z01419 Encounter for gynecological examination (general) (routine) without abnormal findings: Principal | ICD-10-CM

## 2022-07-19 DIAGNOSIS — C9100 Acute lymphoblastic leukemia not having achieved remission: Principal | ICD-10-CM

## 2022-07-19 NOTE — Unmapped (Signed)
Assessment/Plan:     Well Woman Exam  -Pap done today.   - Breast exam today  -Mammogram ordered  -Will plan to discuss colonscopy timing with her oncologist given prior neutropenic status  -Counseled on importance of healthy diet and regular exercise.   -Depression and DV screenings negative  -STI screening offered and declined  - We reviewed in detail all methods of contraception including combined-hormonal methods (not recommended given current cancer diagnosis and VTE risk), progestin-only methods such as depo, implant, LNG-IUD, non-hormonal methods such as condoms and Copper IUD. We reviewed efficacy, side effects, and bleeding profile given risk of AUB for patients on chemotherapy   - Patient currently using Lupron q 3 months for menstrual suppression. Discussed with patient that while Lupron provides ovulation and menstrual suppression which can theoretically prevent pregnancy, it is not approved as a form of contraception. Additionally, discussed that while patient likely has history of infertility (has many year history of unprotected intercourse without pregnancy in the past), there may still be a chance of pregnancy. Patient expressed understanding of this risk.  - After discussion of options, patient would like to continue with Lupron for menstrual suppression with condoms for back-up contraception if sexually active. She reports that she is very rarely SA given vaginal dryness and pain. Discussed use of lubricants with intercourse if sexually active and reviewed that dryness and vasomotor symptoms could be secondary to hypoestrogenic effects of Lupron.   - Will relay plan to oncology team  - Discussed with patient that she may need hormone add-back therapy with continued Lupron use   - Information provided in case patient would like to consider other contraceptive options in the future. Patient reports she is interested in transitioning from Lupron to another method after feeling more stable on her maintenance chemo.        Diagnoses and all orders for this visit:    B-cell acute lymphoblastic leukemia (ALL) (CMS-HCC)  -     Ambulatory referral to Gynecology              Subjective     HPI    Patricia [name] is a 46 y.o. G0P0000  Here today for annual well woman exam.    Due for pap; history of abnormal pap requiring LEEP (pt reports for +HPV) about 13 years ago. Had pap in 2019 which was HPV neg/AGC. She reports having biopsies done including of the uterus and was told they were normal.     She is currently on chemotherapy for B-cell ALL, diagnosed in 09/2021 which is when she started chemo. She is s/p 5 cycles of chemo and is currently in maintenance chemo. 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   Not currently having fevers since starting medication for CMV.     Her oncologists recommended she start contraception until her chemotherapy has completed (likely later in 2024 or 2025).   She was started on Lupron for ovarian suppression during her chemo.  Her last Lupron injection was in March.     Her last contraception before this was many years ago, when she on COCs but stopped due to BP issues. Reports she had a 1 year interval with no contraception before starting Lupron where she and her husband were trying to get pregnant but were unsuccessful. She reports having several moments in her past where she tried to get pregnant or was having unprotected sex and never had a confirmed pregnancy,  even before starting chemo.     She reports history of painful and heavy periods/regular and was told she likely had endometriosis. OCPs didn't really help too much. Since starting Lupon in March/April, she hasn't had any pain or bleeding.     She attempted sex with her husband a few weeks ago, but was too painful for penetrative intercourse. Was the first time she was sexually active in 6 months. She denies dyspareunia like this previously.     Denies vaginal dryness normally. Does report hot flashes.     She does not believe she desires future fertility.     Depression: denies  DV: denies    Chief Complaint    Contraception           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     Past Surgical History:   Procedure Laterality Date    IR INSERT PORT AGE GREATER THAN 5 YRS  12/08/2021    IR INSERT PORT AGE GREATER THAN 5 YRS 12/08/2021 Courtney Harris Evron, PA IMG [facility]       OB History       Gravida   0    Para   0    Term   0    Preterm   0    AB   0    Living   0         SAB   0    IAB   0    Ectopic   0    Molar   0    Multiple   0    Live Births   0                PGYN history:  h/o abnormal pap; no STIs, No fibroids/cysts           Social History     Socioeconomic History    Marital status: Married   Tobacco Use    Smoking status: Former     Current packs/day: 0.00     Types: Cigarettes     Quit date: 11/12/2015     Years since quitting: 6.6    Smokeless tobacco: Never   Substance and Sexual Activity    Alcohol use: Not Currently    Drug use: Never     Social Determinants of Health     Financial Resource Strain: Low Risk  (11/02/2021)    Overall Financial Resource Strain (CARDIA)     Difficulty of Paying Living Expenses: Not hard at all   Food Insecurity: No Food Insecurity (11/02/2021)    Hunger Vital Sign     Worried About Running Out of Food in the Last Year: Never true     Ran Out of Food in the Last Year: Never true   Transportation Needs: No Transportation Needs (11/02/2021)    PRAPARE - Therapist, art (Medical): No     Lack of Transportation (Non-Medical): No     History reviewed. No pertinent family history.      Meds- see Epic  All- see Epic    A 10-point review of systems was negative except as per HPI.       Objective   BP 107/65  - Pulse 95  - Wt 80.9 kg (178 lb 4.8 oz)  - LMP 10/30/2021 Comment: [place] - BMI 28.78 kg/m??     Gen: well-appearing, well-developed, well-nourished female  in NAD  Breast: equal and symmetric bilaterally. No masses/nipple retraction/discharge.  No axillary LAD  Abd: soft, obese, nontender, nondistended  Pelvic: nefg,normal well-rugated vaginal mucosa, cervix nulliparous. No discharge. No bleeding. Uterus about 7 weeks, AV, nontender. No adnexal tenderness/masses.   Ext: no calf tenderness, no edema, normal ROM          '

## 2022-07-20 DIAGNOSIS — C9100 Acute lymphoblastic leukemia not having achieved remission: Principal | ICD-10-CM

## 2022-07-20 MED ORDER — CETIRIZINE 10 MG TABLET
ORAL_TABLET | Freq: Every evening | ORAL | 2 refills | 30 days | Status: CP
Start: 2022-07-20 — End: 2023-07-20

## 2022-07-21 ENCOUNTER — Other Ambulatory Visit: Admit: 2022-07-21 | Discharge: 2022-07-22 | Payer: PRIVATE HEALTH INSURANCE

## 2022-07-21 ENCOUNTER — Ambulatory Visit: Admit: 2022-07-21 | Discharge: 2022-07-22 | Payer: PRIVATE HEALTH INSURANCE

## 2022-07-21 LAB — HEMATOPATHOLOGY LEUKEMIA/LYMPHOMA FLOW CYTOMETRY, CSF
LYMPHS CSF: 90 %
MONO/MACROPHAGE CSF: 9 %
NEUTROPHILS, CSF: 1 %
NUCLEATED CELLS, CSF: 2 ul (ref 0–5)
RBC CSF: 47 ul — ABNORMAL HIGH

## 2022-07-21 LAB — CBC W/ AUTO DIFF
BASOPHILS ABSOLUTE COUNT: 0 10*9/L (ref 0.0–0.1)
BASOPHILS RELATIVE PERCENT: 0.3 %
EOSINOPHILS ABSOLUTE COUNT: 0 10*9/L (ref 0.0–0.5)
EOSINOPHILS RELATIVE PERCENT: 2.2 %
HEMATOCRIT: 25.4 % — ABNORMAL LOW (ref 34.0–44.0)
HEMOGLOBIN: 9 g/dL — ABNORMAL LOW (ref 11.3–14.9)
LYMPHOCYTES ABSOLUTE COUNT: 1.2 10*9/L (ref 1.1–3.6)
LYMPHOCYTES RELATIVE PERCENT: 55.1 %
MEAN CORPUSCULAR HEMOGLOBIN CONC: 35.4 g/dL (ref 32.0–36.0)
MEAN CORPUSCULAR HEMOGLOBIN: 38.2 pg — ABNORMAL HIGH (ref 25.9–32.4)
MEAN CORPUSCULAR VOLUME: 107.8 fL — ABNORMAL HIGH (ref 77.6–95.7)
MEAN PLATELET VOLUME: 6.3 fL — ABNORMAL LOW (ref 6.8–10.7)
MONOCYTES ABSOLUTE COUNT: 0.1 10*9/L — ABNORMAL LOW (ref 0.3–0.8)
MONOCYTES RELATIVE PERCENT: 5.3 %
NEUTROPHILS ABSOLUTE COUNT: 0.8 10*9/L — ABNORMAL LOW (ref 1.8–7.8)
NEUTROPHILS RELATIVE PERCENT: 37.1 %
PLATELET COUNT: 156 10*9/L (ref 150–450)
RED BLOOD CELL COUNT: 2.36 10*12/L — ABNORMAL LOW (ref 3.95–5.13)
RED CELL DISTRIBUTION WIDTH: 20.2 % — ABNORMAL HIGH (ref 12.2–15.2)
WBC ADJUSTED: 2.2 10*9/L — ABNORMAL LOW (ref 3.6–11.2)

## 2022-07-21 LAB — COMPREHENSIVE METABOLIC PANEL
ALBUMIN: 3.7 g/dL (ref 3.4–5.0)
ALKALINE PHOSPHATASE: 70 U/L (ref 46–116)
ALT (SGPT): 57 U/L — ABNORMAL HIGH (ref 10–49)
ANION GAP: 11 mmol/L (ref 5–14)
AST (SGOT): 39 U/L — ABNORMAL HIGH (ref <=34)
BILIRUBIN TOTAL: 0.5 mg/dL (ref 0.3–1.2)
BLOOD UREA NITROGEN: 15 mg/dL (ref 9–23)
BUN / CREAT RATIO: 20
CALCIUM: 9.1 mg/dL (ref 8.7–10.4)
CHLORIDE: 108 mmol/L — ABNORMAL HIGH (ref 98–107)
CO2: 23 mmol/L (ref 20.0–31.0)
CREATININE: 0.75 mg/dL
EGFR CKD-EPI (2021) FEMALE: >90 mL/min/{1.73_m2} (ref >=60)
GLUCOSE RANDOM: 133 mg/dL (ref 70–179)
POTASSIUM: 4.2 mmol/L (ref 3.4–4.8)
PROTEIN TOTAL: 6.1 g/dL (ref 5.7–8.2)
SODIUM: 142 mmol/L (ref 135–145)

## 2022-07-21 LAB — MAGNESIUM: MAGNESIUM: 1.8 mg/dL (ref 1.6–2.6)

## 2022-07-21 MED ADMIN — LORazepam (ATIVAN) injection 1.5 mg: 1.5 mg | INTRAVENOUS | @ 14:00:00 | Stop: 2022-07-21

## 2022-07-21 MED ADMIN — methotrexate (Preservative Free) 12 mg, hydrocortisone sod succ (Solu-CORTEF) 50 mg in sodium chloride (NS) 0.9 % 6 mL INTRATHECAL syringe: INTRATHECAL | @ 14:00:00 | Stop: 2022-07-21

## 2022-07-21 NOTE — Unmapped (Signed)
0840: Pt here for scheduled LP.   Pt in clinic today for LP. Labs reviewed and consent obtained by NP.     Time out completed.

## 2022-07-21 NOTE — Unmapped (Signed)
Please leave dressing in place and keep it dry for 24 hrs before removing. You can resume normal activities tomorrow, but take things easy today.     You may take tylenol as needed for discomfort at the area where the biopsy was taken.   After receiving Ativan, please do not drive today.     If you have questions between 8am to 5 pm Monday through Friday please call [phone] and speak to the operator.      For emergencies, evenings or weekends, please call [phone] and ask for oncology fellow on call.     Reasons to call emergency line may include:   Fever of 100.5 or greater   Nausea and/or vomiting not relieved with nausea medicine   Diarrhea or constipation   Severe pain not relieved with usual pain regimen       Hospital Outpatient Visit on 07/21/2022   Component Date Value Ref Range Status    Protein, CSF 07/21/2022 27  20 - 59 mg/dL Final    Glucose, CSF 57/84/6962 62  48 - 79 mg/dL Final    Tube # CSF 95/28/4132 Tube 1   Final    Color, CSF 07/21/2022 Straw   Final    Appearance, CSF 07/21/2022 Clear   Final    Supernatant Color (Spun), CSF 07/21/2022 Colorless   Final    Spun Appearance, CSF 07/21/2022 Clear   Final    Nucleated Cells, CSF 07/21/2022 2  0 - 5 ul Final    RBC, CSF 07/21/2022 47 (H)  0 ul Final    #Cells counted for Diff 07/21/2022 100   Final    Neutrophil %, CSF 07/21/2022 1.0  % Final    Lymphs %, CSF 07/21/2022 90.0  % Final    Mono/Macrophage %, CSF 07/21/2022 9.0  % Final   Appointment on 07/21/2022   Component Date Value Ref Range Status    Sodium 07/21/2022 142  135 - 145 mmol/L Final    Potassium 07/21/2022 4.2  3.4 - 4.8 mmol/L Final    Chloride 07/21/2022 108 (H)  98 - 107 mmol/L Final    CO2 07/21/2022 23.0  20.0 - 31.0 mmol/L Final    Anion Gap 07/21/2022 11  5 - 14 mmol/L Final    BUN 07/21/2022 15  9 - 23 mg/dL Final    Creatinine 44/08/270 0.75  0.55 - 1.02 mg/dL Final    BUN/Creatinine Ratio 07/21/2022 20   Final    eGFR CKD-EPI (2021) Female 07/21/2022 >90  >=60 mL/min/1.21m2 Final    eGFR calculated with CKD-EPI 2021 equation in accordance with [employer] and [employer] of Nephrology Task Force recommendations.    Glucose 07/21/2022 133  70 - 179 mg/dL Final    Calcium 53/66/4403 9.1  8.7 - 10.4 mg/dL Final    Albumin 47/42/5956 3.7  3.4 - 5.0 g/dL Final    Total Protein 07/21/2022 6.1  5.7 - 8.2 g/dL Final    Total Bilirubin 07/21/2022 0.5  0.3 - 1.2 mg/dL Final    AST 38/75/6433 39 (H)  <=34 U/L Final    ALT 07/21/2022 57 (H)  10 - 49 U/L Final    Alkaline Phosphatase 07/21/2022 70  46 - 116 U/L Final    Magnesium 07/21/2022 1.8  1.6 - 2.6 mg/dL Final    WBC 29/51/8841 2.2 (L)  3.6 - 11.2 10*9/L Final    RBC 07/21/2022 2.36 (L)  3.95 - 5.13 10*12/L Final  HGB 07/21/2022 9.0 (L)  11.3 - 14.9 g/dL Final    HCT 08/65/7846 25.4 (L)  34.0 - 44.0 % Final    MCV 07/21/2022 107.8 (H)  77.6 - 95.7 fL Final    MCH 07/21/2022 38.2 (H)  25.9 - 32.4 pg Final    MCHC 07/21/2022 35.4  32.0 - 36.0 g/dL Final    RDW 96/29/5284 20.2 (H)  12.2 - 15.2 % Final    MPV 07/21/2022 6.3 (L)  6.8 - 10.7 fL Final    Platelet 07/21/2022 156  150 - 450 10*9/L Final    Neutrophils % 07/21/2022 37.1  % Final    Lymphocytes % 07/21/2022 55.1  % Final    Monocytes % 07/21/2022 5.3  % Final    Eosinophils % 07/21/2022 2.2  % Final    Basophils % 07/21/2022 0.3  % Final    Absolute Neutrophils 07/21/2022 0.8 (L)  1.8 - 7.8 10*9/L Final    Absolute Lymphocytes 07/21/2022 1.2  1.1 - 3.6 10*9/L Final    Absolute Monocytes 07/21/2022 0.1 (L)  0.3 - 0.8 10*9/L Final    Absolute Eosinophils 07/21/2022 0.0  0.0 - 0.5 10*9/L Final    Absolute Basophils 07/21/2022 0.0  0.0 - 0.1 10*9/L Final    Macrocytosis 07/21/2022 Slight (A)  Not Present Final    Anisocytosis 07/21/2022 Moderate (A)  Not Present Final

## 2022-07-21 NOTE — Unmapped (Addendum)
Lumbar Puncture Procedure Note    Today's Date: 07/21/2022    Diagnosis:   Patient Active Problem List   Diagnosis    Leukocytosis    B-cell acute lymphoblastic leukemia (ALL) (CMS-HCC)    Acute cough    Dyspepsia    Acute frontal sinusitis    Pancytopenia (CMS-HCC)    Fever and neutropenia (CMS-HCC)    Blurred vision, bilateral    Anxiety    Hypogammaglobulinemia (CMS-HCC)    Hypokalemia       Indications:    Diagnosis ICD-10-CM Associated Orders   1. B-cell acute lymphoblastic leukemia (ALL) (CMS-HCC)  C91.00 CSF protein     Glucose, CSF     Hempath Leukemia Flowcytometry, CSF     methotrexate (Preservative Free) 12 mg, hydrocortisone sod succ (Solu-CORTEF) 50 mg in sodium chloride (NS) 0.9 % 6 mL INTRATHECAL syringe     LORazepam (ATIVAN) injection 1.5 mg     CSF protein     CSF protein     Glucose, CSF     Glucose, CSF     Hempath Leukemia Flowcytometry, CSF     Hempath Leukemia Flowcytometry, CSF     Hematopathology Order     Hematopathology Order          Premedication: Ativan 1.5mg  IV    Clinician(s) Performing Procedure:  Link Burgeson K. Venancio Poisson, [name] AGNP    Procedure Details     Consent: The risks, benefits, and alternatives to the procedure were discussed. All questions were answered. Consent was obtained and witnessed.    A time-out in which Her patient identifiers were checked by 2 providers was performed.    The patient was positioned under sterile conditions. Betadine solution and sterile drapes were utilized. Local anesthesia with 1% lidocaine was applied subcutaneously then deep to the skin. A spinal needle was inserted at the L3 - L4 interspace. Spinal fluid was obtained and sent to the laboratory.    Intrathecal MTX was administered over 4 min.    The spinal needle with trocar was removed with minimal bleeding noted upon removal. A sterile bandage was placed over the puncture site after holding pressure.        Findings  6mL of clear spinal fluid was obtained.    Complications:  None; patient tolerated the procedure well.          Condition: stable    Plan  Bed rest for 1 hours.  Tylenol 650 mg for pain.  Call office if you develop a severe headache, nausea, vomiting, or fever greater than 100.5 F.

## 2022-07-22 NOTE — Unmapped (Signed)
[facility]ukemia [facility] Follow Up Visit Note     Patricia [name] [name] Name: Patricia [name] Patricia [name] [name]  Patricia [name] [name] Age: 46 y.o.  Encounter Date: 07/26/2022    Primary Care Provider:  [name], MD    Referring Physician:  [name], MD  [address]  [place],  [place] [address]          Cancer Diagnosis: Ph+ B-ALL; Initial Dx 10/29/2021  Cancer status: CR1, BCR/ABL p190 negative in marrow  Treatment Regimen: First Line, Maintenance (Dasatinib, vincristine, prednisone) s/p truncated course of GRAAPH-2005, HyperCVAD  Treatment Goal: Curative  Comorbidities: HTN, anxiety, hemorrhoids with anal fissures      Assessment/Plan:  Patricia [name] Patricia [name] [name] is a 46 y.o. female with past medical history of chronic right shoulder and back pain, hypertension, anxiety, and recently diagnosed Ph+ ALL. 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  Given her deep molecular remission and her intolerance of hyperCVAD, we are transitioned to maintenance chemotherapy.     She is now s/p Cycle Maintenance. Cell count are now improving and she has been afebrile, so the CMV viremia was likely contributing. As of today, CMV is now negative. Valcyte dose can be decreased.   Today she will start IVIG.    Ph+ALL, in remission: On GRAAPH 2005 + dasatinib.   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  - weekly CMV levels  - decrease Valcyte to 450mg  BID    Hypogammaglobulinemia:   - Monthly IVIG starting 07/26/22    Hemorrhoids, Anal fissure: persistent but stable, per Patricia [name] [name] 07/26/2022  - supportive care at home    Pancytopenia due to chemotherapy  - ANC 0.9, platelets 132K 07/26/2022  - supportive care as detailed below when in nadir    Menses suppression and birth control: Lupron  - dose 07/26/22    Will need birth control while on chemo, including when on only TKI -     Psychosocial distress: She reports a moderate level of anxiety regarding the management of the above.   - Counseling given   - Consider ref to comprehensive cancer support program     Patricia [name] [name]-centered care/Shared decision-making:   We discussed the plan above at length. The Patricia [name] [name] and her husband actively contributed to the conversation. Specifically, her most important outcomes are:   Prolonging overall survival and Maintaining her overall functional activity     Supportive Care Needs: We recommend based on the Patricia [name] [name]???s underlying diagnosis and treatment history the following supportive care:    1. Antimicrobial prophylaxis:    Viral - HOLD valacyclovir while on Valcyte  Bacterial: Levofloxacin when ANC <0.5  Fungal: Fluconazole 200mg  when ANC <0.5  PJP: dapsone 100mg  daily    2. Blood product support:  Leukoreduced blood products are required.  Irradiated blood products are preferred, but in case of urgent transfusion needs non-irradiated blood products may be used: 2 units for Hg <=8.0.  Coordination of care:   - Start maintenance   - weekly lab checks      [name], AGPCNP-BC  Nurse Practitioner  Hematology/Oncology  [facility]    [name], MD was available  Leukemia Program  Division of Hematology  [facility]      Nurse Navigator (non-clinical trial patients): Elicia Lamp, RN        Tel. [phone]       Fax. [phone]  Toll-free appointments: [phone]  Scheduling assistance: [phone]  After hours/weekends: [phone] (ask for adult hematology/oncology on-call)      History of Present Illness:   Patricia [name] Patricia [name] [name] is a 46 y.o. female with past medical history noted as above who presents for follow up Ph+ ALL.      Her social history is notable for being a Runner, broadcasting/film/video.  She was recently married. She lives with her husband.    Interim history  She reports feeling well.   No more fevers. TMax 99's when active.  Mild dull headache in the past couple of days.  No GI complaints.  Mild tingling in toes and she has some pain/tingling that radiate down the left lateral leg.  No respiratory complaints.    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 The majority of the abnormal cells (64/88) contained an additional BCR/ABL1 fusion signal, while 9/88 abnormal cells contained an additional ABL1 and ASS1 signal.       Genetics:   Karyotype/FISH:   RESULTS   Date Value Ref Range Status   10/28/2021   Final    NOTE: This report reflects a combined study from a peripheral blood and a bone marrow core biopsy. Eleven cells from the peripheral blood and nine cells from the bone marrow core biopsy were analyzed. The BCR/ABL1 FISH analysis was performed on the peripheral blood.     Abnormal Karyotype:  47,XX,t(9;22)(q34;q11.2),+der(22)t(9;22)[18]/46,XX[2]    Abnormal FISH:  A BCR/ABL1 interphase FISH assay showed a signal pattern consistent with a BCR::ABL1 rearrangement and the 9;22 translocation in 88% of the 100 cells scored. The majority of the abnormal cells (64/88) contained an additional BCR/ABL1 fusion signal, while 9/88 abnormal cells contained an additional ABL1 and ASS1 signal.           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  MRD by flow insufficient, p190 2/100,000  12/10/2021: Cycle 2 B cycle + rituximab  12/14/21: IT#4  12/17/21: IT#5  01/10/22: Cycle 3 A cycle + rituximab  01/24/22: Bmbx - MRD-neg by PCR (p190)   02/17/22: C4 B cycle + ritux  02/18/22: IT#6  03/21/22: C5 A cycle + ritux (4th dose)  03/22/22: IT#7  04/29/22: Bmbx -  MRD-neg by PCR (p190)   05/18/22: restart dasatinib 70mg  s/p neutropenia  05/26/22: Start maintenance vincristine 2g, Prednisone 200mg  D1-4   06/02/22: PLAN IT #8           Review of Systems:   ROS reviewed and negative except as noted in H and P     Allergies:  Allergies   Allergen Reactions    Erythromycin Hives     Other reaction(s): Not available    Other      Pt cant take ibuprofen due to condition.    Sulfa (Sulfonamide Antibiotics) Anaphylaxis     Other reaction(s): Not available    Azithromycin        Medications:     Current Outpatient Medications:     acetaminophen (TYLENOL 8 HOUR) 650 MG CR tablet, Take 2 tablets (1,300 mg total) by mouth every eight (8) hours as needed for pain., Disp: , Rfl:     albuterol HFA 90 mcg/actuation inhaler, Inhale 2 puffs every six (6) hours as needed for wheezing., Disp: , Rfl:     cetirizine (ZYRTEC) 10 MG tablet, Take 1 tablet (10 mg total) by mouth nightly., Disp: 30 tablet, Rfl: 2    dapsone 100 MG tablet, TAKE 1 TABLET(100 MG) BY MOUTH DAILY, Disp: 30 tablet, Rfl: 11    dasatinib (SPRYCEL) 70 MG tablet, Take 1 tablet (70 mg total) by mouth daily., Disp: 30 tablet, Rfl: 7    escitalopram oxalate (LEXAPRO) 10 MG tablet, Take 1 tablet (10 mg total) by mouth daily., Disp: 90 tablet, Rfl: 0    fluconazole (DIFLUCAN) 200 MG tablet, Take 1 tablet (200 mg total) by mouth daily. (Patricia [name] [name] not taking: Reported on 07/26/2022), Disp: , Rfl:     levalbuterol (XOPENEX) 0.31 mg/3 mL nebulizer solution, Inhale 3 mL (0.31 mg total) by nebulization every four (4) hours as needed for wheezing (cough)., Disp: 45 mL, Rfl: 0    levoFLOXacin (LEVAQUIN) 500 MG tablet, Take 1 tablet (500 mg total) by mouth daily. (Patricia [name] [name] not taking: Reported on 07/26/2022), Disp: , Rfl:     tizanidine (ZANAFLEX) 4 MG tablet, Take 1 tablet (4 mg total) by mouth nightly., Disp: 90 tablet, Rfl: 2    norethindrone (MICRONOR) 0.35 mg tablet, Take 1 tablet by mouth daily., Disp: 84 tablet, Rfl: 3    predniSONE (DELTASONE) 50 MG tablet, Take 4 tablets (200 mg total) by mouth once daily on days 1-5 of each cycle., Disp: 20 tablet, Rfl: 5    riboflavin, vitamin B2, 400 mg Tab, Take 400 mg by mouth in the morning., Disp: 30 tablet, Rfl: 6    triamcinolone (KENALOG) 0.1 % cream, Apply topically two (2) times a day for 14 days., Disp: 45 g, Rfl: 0    valGANciclovir (VALCYTE) 450 mg tablet, Take 1 tablet (450 mg total) by mouth two (2) times a day., Disp: 60 tablet, Rfl: 2    Current Facility-Administered Medications:     leuprolide (LUPRON) 11.25 mg injection, , , ,     Facility-Administered Medications Ordered in Other Visits:     dextrose 5 % infusion, 30 mL/hr, Intravenous, Continuous, Lenon Ahmadi, AGNP, Stopped at 07/26/22 1845    dextrose 5 % infusion, 60 mL/hr, Intravenous, Continuous PRN, Lenon Ahmadi, AGNP    heparin, porcine (PF) 100 unit/mL injection 500 Units, 500 Units, Intravenous, Q30 Min PRN, [name], MD, 500 Units at 07/26/22 1730    OKAY TO SEND MEDICATION/CHEMOTHERAPY TO OUTPATIENT UNIT, , Other, Once, [name]  R, MD    sodium chloride (NS) 0.9 % infusion, 100 mL/hr, Intravenous, Continuous, [name], MD, Stopped at 07/26/22 1727    Medical History:  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    EBV infection        Social History:  Social History     Social History Narrative    Not on file       Family History:  No family history on file.    Objective:   BP 109/77  - Pulse 99  - Temp 37.1 ??C (98.8 ??F) (Oral)  - Resp 15  - Ht 167.6 cm (5' 5.98)  - Wt 83.6 kg (184 lb 4.9 oz)  - LMP 10/30/2021 Comment: [place] - SpO2 97%  - BMI 29.76 kg/m??     Physical Exam:  GENERAL: well-appearing  woman   HEART: Normal color, not excessive pallor.  CHEST/LUNG: Normal work of breathing. No dyspnea with conversation. No crackles. Regular rate/rhythm  ABDOMEN: No obvious distention.    EXTREMITIES: No edema, cyanosis or clubbing.   SKIN: no rashes noted  NEURO EXAM: Grossly intact.       Test Results:  07/26/2022 CBC/d and CMP reviewed with Patricia [name] [name]

## 2022-07-22 NOTE — Unmapped (Signed)
Encounter addended by: Darel Hong, PA on: 07/22/2022 3:42 PM   Actions taken: Clinical Note Signed

## 2022-07-26 ENCOUNTER — Ambulatory Visit
Admit: 2022-07-26 | Discharge: 2022-07-26 | Payer: PRIVATE HEALTH INSURANCE | Attending: Adult Health | Primary: Adult Health

## 2022-07-26 ENCOUNTER — Encounter: Admit: 2022-07-26 | Discharge: 2022-07-26 | Payer: PRIVATE HEALTH INSURANCE

## 2022-07-26 ENCOUNTER — Ambulatory Visit: Admit: 2022-07-26 | Discharge: 2022-07-26 | Payer: PRIVATE HEALTH INSURANCE

## 2022-07-26 ENCOUNTER — Institutional Professional Consult (permissible substitution): Admit: 2022-07-26 | Discharge: 2022-07-26 | Payer: PRIVATE HEALTH INSURANCE

## 2022-07-26 ENCOUNTER — Other Ambulatory Visit: Admit: 2022-07-26 | Discharge: 2022-07-26 | Payer: PRIVATE HEALTH INSURANCE

## 2022-07-26 DIAGNOSIS — C9100 Acute lymphoblastic leukemia not having achieved remission: Principal | ICD-10-CM

## 2022-07-26 DIAGNOSIS — D801 Nonfamilial hypogammaglobulinemia: Principal | ICD-10-CM

## 2022-07-26 DIAGNOSIS — B259 Cytomegaloviral disease, unspecified: Principal | ICD-10-CM

## 2022-07-26 LAB — CBC W/ AUTO DIFF
BASOPHILS ABSOLUTE COUNT: 0 10*9/L (ref 0.0–0.1)
BASOPHILS RELATIVE PERCENT: 0.3 %
EOSINOPHILS ABSOLUTE COUNT: 0 10*9/L (ref 0.0–0.5)
EOSINOPHILS RELATIVE PERCENT: 2 %
HEMATOCRIT: 21.9 % — ABNORMAL LOW (ref 34.0–44.0)
HEMOGLOBIN: 7.9 g/dL — ABNORMAL LOW (ref 11.3–14.9)
LYMPHOCYTES ABSOLUTE COUNT: 0.9 10*9/L — ABNORMAL LOW (ref 1.1–3.6)
LYMPHOCYTES RELATIVE PERCENT: 46.9 %
MEAN CORPUSCULAR HEMOGLOBIN CONC: 36 g/dL (ref 32.0–36.0)
MEAN CORPUSCULAR HEMOGLOBIN: 39.3 pg — ABNORMAL HIGH (ref 25.9–32.4)
MEAN CORPUSCULAR VOLUME: 109.2 fL — ABNORMAL HIGH (ref 77.6–95.7)
MEAN PLATELET VOLUME: 6.3 fL — ABNORMAL LOW (ref 6.8–10.7)
MONOCYTES ABSOLUTE COUNT: 0 10*9/L — ABNORMAL LOW (ref 0.3–0.8)
MONOCYTES RELATIVE PERCENT: 2.2 %
NEUTROPHILS ABSOLUTE COUNT: 0.9 10*9/L — ABNORMAL LOW (ref 1.8–7.8)
NEUTROPHILS RELATIVE PERCENT: 48.6 %
PLATELET COUNT: 132 10*9/L — ABNORMAL LOW (ref 150–450)
RED BLOOD CELL COUNT: 2.01 10*12/L — ABNORMAL LOW (ref 3.95–5.13)
RED CELL DISTRIBUTION WIDTH: 19.7 % — ABNORMAL HIGH (ref 12.2–15.2)
WBC ADJUSTED: 1.8 10*9/L — ABNORMAL LOW (ref 3.6–11.2)

## 2022-07-26 LAB — COMPREHENSIVE METABOLIC PANEL
ALBUMIN: 3.7 g/dL (ref 3.4–5.0)
ALKALINE PHOSPHATASE: 62 U/L (ref 46–116)
ALT (SGPT): 62 U/L — ABNORMAL HIGH (ref 10–49)
ANION GAP: 5 mmol/L (ref 5–14)
AST (SGOT): 50 U/L — ABNORMAL HIGH (ref <=34)
BILIRUBIN TOTAL: 0.5 mg/dL (ref 0.3–1.2)
BLOOD UREA NITROGEN: 19 mg/dL (ref 9–23)
BUN / CREAT RATIO: 30
CALCIUM: 8.7 mg/dL (ref 8.7–10.4)
CHLORIDE: 110 mmol/L — ABNORMAL HIGH (ref 98–107)
CO2: 24 mmol/L (ref 20.0–31.0)
CREATININE: 0.64 mg/dL
EGFR CKD-EPI (2021) FEMALE: >90 mL/min/{1.73_m2} (ref >=60)
GLUCOSE RANDOM: 116 mg/dL (ref 70–179)
POTASSIUM: 4.2 mmol/L (ref 3.4–4.8)
PROTEIN TOTAL: 5.8 g/dL (ref 5.7–8.2)
SODIUM: 139 mmol/L (ref 135–145)

## 2022-07-26 LAB — CMV DNA, QUANTITATIVE, PCR: CMV VIRAL LD: NOT DETECTED

## 2022-07-26 MED ORDER — NORETHINDRONE (CONTRACEPTIVE) 0.35 MG TABLET
ORAL_TABLET | Freq: Every day | ORAL | 3 refills | 84 days | Status: CP
Start: 2022-07-26 — End: 2023-07-26

## 2022-07-26 MED ORDER — VALGANCICLOVIR 450 MG TABLET
ORAL_TABLET | Freq: Two times a day (BID) | ORAL | 2 refills | 30 days | Status: CP
Start: 2022-07-26 — End: ?
  Filled 2022-08-08: qty 60, 30d supply, fill #0

## 2022-07-26 MED ADMIN — diphenhydrAMINE (BENADRYL) injection: 25 mg | INTRAVENOUS | @ 20:00:00 | Stop: 2022-07-26

## 2022-07-26 MED ADMIN — vinCRIStine (ONCOVIN) 2 mg in sodium chloride (NS) 0.9 % 25 mL IVPB: 2 mg | INTRAVENOUS | @ 22:00:00 | Stop: 2022-07-26

## 2022-07-26 MED ADMIN — meperidine (DEMEROL) injection 25 mg: 25 mg | INTRAVENOUS | @ 20:00:00 | Stop: 2022-07-26

## 2022-07-26 MED ADMIN — dextrose 5 % infusion: 30 mL/h | INTRAVENOUS | @ 18:00:00

## 2022-07-26 MED ADMIN — acetaminophen (TYLENOL) tablet 650 mg: 650 mg | ORAL | @ 17:00:00 | Stop: 2022-07-26

## 2022-07-26 MED ADMIN — leuprolide (LUPRON) injection 11.25 mg: 11.25 mg | INTRAMUSCULAR | @ 15:00:00 | Stop: 2022-07-26

## 2022-07-26 MED ADMIN — famotidine (PF) (PEPCID) injection 20 mg: 20 mg | INTRAVENOUS | @ 20:00:00 | Stop: 2022-07-26

## 2022-07-26 MED ADMIN — heparin, porcine (PF) 100 unit/mL injection 500 Units: 500 [IU] | INTRAVENOUS | @ 23:00:00 | Stop: 2022-07-27

## 2022-07-26 MED ADMIN — diphenhydrAMINE (BENADRYL) capsule/tablet 25 mg: 25 mg | ORAL | @ 17:00:00 | Stop: 2022-07-26

## 2022-07-26 MED ADMIN — immun glob G(IgG)-pro-IgA 0-50 (PRIVIGEN) 10 % intravenous solution 25 g: .4 g/kg | INTRAVENOUS | @ 18:00:00 | Stop: 2022-07-26

## 2022-07-26 NOTE — Unmapped (Incomplete)
Pt arrived to infusion for IVIG and Vincristine. IVIG started. First couple rate changes tolerated well. Pt reported some rigors. Infusion stopped emergency meds given. Symptoms resolved. IVIG restarted, pt tolerating it well. Report to [facility].

## 2022-07-26 NOTE — Unmapped (Signed)
1013 Administered Lupron and patient tolerated well. Applied band-aid .

## 2022-07-26 NOTE — Unmapped (Signed)
Clinical Assessment Needed For: Dose Change  Medication: Valganciclovir 450mg  tablet  Last Fill Date/Day Supply: 07/07/2022 / 30 days  Refill Too Soon until 07/30/2022  Was previous dose already scheduled to fill: No    Notes to Pharmacist: Will re-test on 01/02

## 2022-07-26 NOTE — Unmapped (Addendum)
You are doing well!      Treatment plan:  - vincristine today  - start the 5 days of prednisone  - IVIG today  - Lupron today  - we will request a blood transfusion for later this week  - decrease the Valcyte dose (pharmacy will give you the details)  - weekly labs at [facility]  - return to clinic in 4 weeks    Please let us know if any concerns arise

## 2022-07-26 NOTE — Unmapped (Signed)
We'll proceed with vincrsitine and steroids burst today! Start prednisone 200 mg x 5 days.    We'll give you IVIG today to help your immune system    Please REDUCE dose of Valcyte to 450 mg (1 tablet) twice daily    Please START micronor as protection (bone, heart, contraception) while on Lupron. We can see how you feel on micronor and keep this as a possible option as contraception after the Lupron wears off.     Lab on 07/26/2022   Component Date Value Ref Range Status    Sodium 07/26/2022 139  135 - 145 mmol/L Final    Potassium 07/26/2022 4.2  3.4 - 4.8 mmol/L Final    Chloride 07/26/2022 110 (H)  98 - 107 mmol/L Final    CO2 07/26/2022 24.0  20.0 - 31.0 mmol/L Final    Anion Gap 07/26/2022 5  5 - 14 mmol/L Final    BUN 07/26/2022 19  9 - 23 mg/dL Final    Creatinine 16/05/9603 0.64  0.55 - 1.02 mg/dL Final    BUN/Creatinine Ratio 07/26/2022 30   Final    eGFR CKD-EPI (2021) Female 07/26/2022 >90  >=60 mL/min/1.70m2 Final    eGFR calculated with CKD-EPI 2021 equation in accordance with [employer] and [employer] of Nephrology Task Force recommendations.    Glucose 07/26/2022 116  70 - 179 mg/dL Final    Calcium 54/04/8118 8.7  8.7 - 10.4 mg/dL Final    Albumin 14/78/2956 3.7  3.4 - 5.0 g/dL Final    Total Protein 07/26/2022 5.8  5.7 - 8.2 g/dL Final    Total Bilirubin 07/26/2022 0.5  0.3 - 1.2 mg/dL Final    AST 21/30/8657 50 (H)  <=34 U/L Final    ALT 07/26/2022 62 (H)  10 - 49 U/L Final    Alkaline Phosphatase 07/26/2022 62  46 - 116 U/L Final    WBC 07/26/2022 1.8 (L)  3.6 - 11.2 10*9/L Final    RBC 07/26/2022 2.01 (L)  3.95 - 5.13 10*12/L Final    HGB 07/26/2022 7.9 (L)  11.3 - 14.9 g/dL Final    HCT 84/69/6295 21.9 (L)  34.0 - 44.0 % Final    MCV 07/26/2022 109.2 (H)  77.6 - 95.7 fL Final    MCH 07/26/2022 39.3 (H)  25.9 - 32.4 pg Final    MCHC 07/26/2022 36.0  32.0 - 36.0 g/dL Final    RDW 28/41/3244 19.7 (H)  12.2 - 15.2 % Final    MPV 07/26/2022 6.3 (L)  6.8 - 10.7 fL Final Platelet 07/26/2022 132 (L)  150 - 450 10*9/L Final    Neutrophils % 07/26/2022 48.6  % Final    Lymphocytes % 07/26/2022 46.9  % Final    Monocytes % 07/26/2022 2.2  % Final    Eosinophils % 07/26/2022 2.0  % Final    Basophils % 07/26/2022 0.3  % Final    Absolute Neutrophils 07/26/2022 0.9 (L)  1.8 - 7.8 10*9/L Final    Absolute Lymphocytes 07/26/2022 0.9 (L)  1.1 - 3.6 10*9/L Final    Absolute Monocytes 07/26/2022 0.0 (L)  0.3 - 0.8 10*9/L Final    Absolute Eosinophils 07/26/2022 0.0  0.0 - 0.5 10*9/L Final    Absolute Basophils 07/26/2022 0.0  0.0 - 0.1 10*9/L Final    Macrocytosis 07/26/2022 Slight (A)  Not Present Final    Anisocytosis 07/26/2022 Moderate (A)  Not Present Final

## 2022-07-26 NOTE — Unmapped (Signed)
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 Since last cycle, she started treatment dose Valcyte to treat her CMV which has been thought to be causing her cycle of fevers and low counts. She fevered right after starting Valcyte, but then has not had a fever since. Her blood counts dropped in the last couple weeks (thought to be Valcyte related) and she resumed her ppx meds. Feels well otherwise denying new GI uspet, bleeding, SOB, pain etc. She recently met with her gynecologist and decided that one more dose of lupron would be given today to hold her over until her counts are more stable. We discussed the purpose of lupron and risk/benefit of it.      Labs: WBC 1.8, ANC 0.9, Hgb 7.9, PLT 132, CMP notable for grade 1 elevations in LFTs; CMV viral load undetecable; Total lgG 237 from 11/27; absolute CD4 count 276  BCR-ABL p190 pending from today     Oncologic History:  Oncology History Overview Note   Referring/Local Oncologist:    Diagnosis:   10/29/2021  Bone marrow, left iliac, aspiration and biopsy  -  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 The majority of the abnormal cells (64/88) contained an additional BCR/ABL1 fusion signal, while 9/88 abnormal cells contained an additional ABL1 and ASS1 signal.       Genetics:   Karyotype/FISH:   RESULTS   Date Value Ref Range Status   10/28/2021   Final    NOTE: This report reflects a combined study from a peripheral blood and a bone marrow core biopsy. Eleven cells from the peripheral blood and nine cells from the bone marrow core biopsy were analyzed. The BCR/ABL1 FISH analysis was performed on the peripheral blood.     Abnormal Karyotype:  47,XX,t(9;22)(q34;q11.2),+der(22)t(9;22)[18]/46,XX[2]    Abnormal FISH:  A BCR/ABL1 interphase FISH assay showed a signal pattern consistent with a BCR::ABL1 rearrangement and the 9;22 translocation in 88% of the 100 cells scored. The majority of the abnormal cells (64/88) contained an additional BCR/ABL1 fusion signal, while 9/88 abnormal cells contained an additional ABL1 and ASS1 signal.           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  MRD by flow insufficient, p190 2/100,000  12/10/2021: Cycle 2 B cycle + rituximab  12/14/21: IT#4  12/17/21: IT#5  01/10/22: Cycle 3 A cycle + rituximab  01/24/22: Bmbx - MRD-neg by PCR (p190)   02/17/22: C4 B cycle + ritux  02/18/22: IT#6  03/21/22: C5 A cycle +  ritux (4th dose)  03/22/22: IT#7  04/29/22: Bmbx - MRD-neg by PCR (p190)   05/18/22: restart dasatinib 70mg  s/p neutropenia  05/26/22: Start maintenance vincristine 2g, Prednisone 200mg  D1-4   06/02/22: PLAN IT #8       B-cell acute lymphoblastic leukemia (ALL) (CMS-HCC)   10/29/2021 Initial Diagnosis    B-cell acute lymphoblastic leukemia (ALL) (CMS-HCC)     10/30/2021 - 03/30/2022 Chemotherapy    IP/OP LEUKEMIA GRAAPH-2005 < 60 YO (OP PEGFILGRASTIM ON DAY 7)  [No description for this plan]     01/06/2022 - 04/07/2022 Endocrine/Hormone Therapy OP LEUPROLIDE (LUPRON) 11.25 MG EVERY 3 MONTHS  Plan Provider: [name], MD     05/26/2022 -  Chemotherapy    OP LEUKEMIA VINCRISTINE  vinCRIStine 2 mg IV on day 1     07/26/2022 Endocrine/Hormone Therapy    OP LEUPROLIDE (LUPRON) 11.25 MG EVERY 3 MONTHS  Plan Provider: [name], MD         Weight and Vitals:  Wt Readings from Last 3 Encounters:   07/26/22 83.6 kg (184 lb 4.9 oz)   07/19/22 80.9 kg (178 lb 4.8 oz)   07/11/22 81.4 kg (179 lb 7.3 oz)     Temp Readings from Last 3 Encounters:   07/26/22 37.3 ??C (99.2 ??F) (Oral)   07/26/22 37.1 ??C (98.8 ??F) (Oral)   07/21/22 36.9 ??C (98.4 ??F) (Oral)     BP Readings from Last 3 Encounters:   07/26/22 139/84   07/26/22 109/77   07/21/22 115/71     Pulse Readings from Last 3 Encounters:   07/26/22 131   07/26/22 99   07/21/22 116       Pertinent Labs:  Lab on 07/26/2022   Component Date Value Ref Range Status    Sodium 07/26/2022 139  135 - 145 mmol/L Final    Potassium 07/26/2022 4.2  3.4 - 4.8 mmol/L Final    Chloride 07/26/2022 110 (H)  98 - 107 mmol/L Final    CO2 07/26/2022 24.0  20.0 - 31.0 mmol/L Final    Anion Gap 07/26/2022 5  5 - 14 mmol/L Final    BUN 07/26/2022 19  9 - 23 mg/dL Final    Creatinine 09/81/1914 0.64  0.55 - 1.02 mg/dL Final    BUN/Creatinine Ratio 07/26/2022 30   Final    eGFR CKD-EPI (2021) Female 07/26/2022 >90  >=60 mL/min/1.57m2 Final    eGFR calculated with CKD-EPI 2021 equation in accordance with [employer] and [employer] of Nephrology Task Force recommendations.    Glucose 07/26/2022 116  70 - 179 mg/dL Final    Calcium 78/29/5621 8.7  8.7 - 10.4 mg/dL Final    Albumin 30/86/5784 3.7  3.4 - 5.0 g/dL Final    Total Protein 07/26/2022 5.8  5.7 - 8.2 g/dL Final    Total Bilirubin 07/26/2022 0.5  0.3 - 1.2 mg/dL Final    AST 69/62/9528 50 (H)  <=34 U/L Final    ALT 07/26/2022 62 (H)  10 - 49 U/L Final    Alkaline Phosphatase 07/26/2022 62  46 - 116 U/L Final    ABO Grouping 07/26/2022 O POS   Final Antibody Screen 07/26/2022 NEG   Final    WBC 07/26/2022 1.8 (L)  3.6 - 11.2 10*9/L Final    RBC 07/26/2022 2.01 (L)  3.95 - 5.13 10*12/L Final    HGB 07/26/2022 7.9 (L)  11.3 - 14.9 g/dL Final    HCT 41/32/4401 21.9 (L)  34.0 - 44.0 % Final    MCV 07/26/2022 109.2 (H)  77.6 - 95.7 fL Final    MCH 07/26/2022 39.3 (H)  25.9 - 32.4 pg Final    MCHC 07/26/2022 36.0  32.0 - 36.0 g/dL Final    RDW 16/05/9603 19.7 (H)  12.2 - 15.2 % Final    MPV 07/26/2022 6.3 (L)  6.8 - 10.7 fL Final    Platelet 07/26/2022 132 (L)  150 - 450 10*9/L Final    Neutrophils % 07/26/2022 48.6  % Final    Lymphocytes % 07/26/2022 46.9  % Final    Monocytes % 07/26/2022 2.2  % Final    Eosinophils % 07/26/2022 2.0  % Final    Basophils % 07/26/2022 0.3  % Final    Absolute Neutrophils 07/26/2022 0.9 (L)  1.8 - 7.8 10*9/L Final    Absolute Lymphocytes 07/26/2022 0.9 (L)  1.1 - 3.6 10*9/L Final    Absolute Monocytes 07/26/2022 0.0 (L)  0.3 - 0.8 10*9/L Final    Absolute Eosinophils 07/26/2022 0.0  0.0 - 0.5 10*9/L Final    Absolute Basophils 07/26/2022 0.0  0.0 - 0.1 10*9/L Final    Macrocytosis 07/26/2022 Slight (A)  Not Present Final    Anisocytosis 07/26/2022 Moderate (A)  Not Present Final    CMV Viral Ld 07/26/2022 Not Detected  Not Detected Final       Allergies:   Allergies   Allergen Reactions    Erythromycin Hives     Other reaction(s): Not available    Other      Pt cant take ibuprofen due to condition.    Sulfa (Sulfonamide Antibiotics) Anaphylaxis     Other reaction(s): Not available    Azithromycin        Drug Interactions: none identified; dasatinib is a major 3A4 substrate, caution with 3A4 inh/ind; fluconazole max dose with VCR is 200 mg daily; avoid acid reducers with  dasatinib.       Current Medications:  Current Outpatient Medications   Medication Sig Dispense Refill    acetaminophen (TYLENOL 8 HOUR) 650 MG CR tablet Take 2 tablets (1,300 mg total) by mouth every eight (8) hours as needed for pain.      albuterol HFA 90 mcg/actuation inhaler Inhale 2 puffs every six (6) hours as needed for wheezing.      cetirizine (ZYRTEC) 10 MG tablet Take 1 tablet (10 mg total) by mouth nightly. 30 tablet 2    dapsone 100 MG tablet TAKE 1 TABLET(100 MG) BY MOUTH DAILY 30 tablet 11    dasatinib (SPRYCEL) 70 MG tablet Take 1 tablet (70 mg total) by mouth daily. 30 tablet 7    escitalopram oxalate (LEXAPRO) 10 MG tablet Take 1 tablet (10 mg total) by mouth daily. 90 tablet 0    levalbuterol (XOPENEX) 0.31 mg/3 mL nebulizer solution Inhale 3 mL (0.31 mg total) by nebulization every four (4) hours as needed for wheezing (cough). 45 mL 0    predniSONE (DELTASONE) 50 MG tablet Take 4 tablets (200 mg total) by mouth once daily on days 1-5 of each cycle. 20 tablet 5    riboflavin, vitamin B2, 400 mg Tab Take 400 mg by mouth in the morning. 30 tablet 6    tizanidine (ZANAFLEX) 4 MG tablet Take 1 tablet (4 mg total) by mouth nightly. 90 tablet 2    triamcinolone (KENALOG) 0.1 % cream Apply topically two (2) times a day for 14 days. 45 g 0  valGANciclovir (VALCYTE) 450 mg tablet Take 1 tablet (450 mg total) by mouth two (2) times a day. 60 tablet 2    fluconazole (DIFLUCAN) 200 MG tablet Take 1 tablet (200 mg total) by mouth daily. (Patient not taking: Reported on 07/26/2022)      levoFLOXacin (LEVAQUIN) 500 MG tablet Take 1 tablet (500 mg total) by mouth daily. (Patient not taking: Reported on 07/26/2022)      norethindrone (MICRONOR) 0.35 mg tablet Take 1 tablet by mouth daily. 84 tablet 3     No current facility-administered medications for this visit.     Facility-Administered Medications Ordered in Other Visits   Medication Dose Route Frequency Provider Last Rate Last Admin    dextrose 5 % infusion  30 mL/hr Intravenous Continuous Lenon Ahmadi, AGNP 30 mL/hr at 07/26/22 1303 30 mL/hr at 07/26/22 1303    dextrose 5 % infusion  60 mL/hr Intravenous Continuous PRN Lenon Ahmadi, AGNP        leuprolide (LUPRON) 11.25 mg injection OKAY TO SEND MEDICATION/CHEMOTHERAPY TO OUTPATIENT UNIT   Other Once [name], MD        sodium chloride (NS) 0.9 % infusion  100 mL/hr Intravenous Continuous [name], MD        vinCRIStine (ONCOVIN) 2 mg in sodium chloride (NS) 0.9 % 25 mL IVPB  2 mg Intravenous Once [name], MD           Adherence: No barriers identified/no missed doses.       Assessment: Patricia [name] is a 46 y.o. 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 Patient finished 5 cycles of hyperCVAD + rituximab + dasatinib before transitioning to VCR/dasatinib/prednisone maintenance. She is currently on dasatinib 70 mg daily, previously unable to titrate to normal maintenance dosing due to neutropenia (like CMV and now valcyte related). Doing well, CMV now undetectable and will dose reduce valcyte to ppx dosing which should mitigate cytopenias. Discussed one more dose of lupron, and addition of micronor for protective benefit and to see if she tolerates for possible contraception option after lupron. She is no longer at risk for significant TCP therefore not needing ovarian suppression.     Plan:     Ph+ ALL  - Continue vincristine IV on day 1 of 28 day cycle (due today)  - Continue prednisone 200 mg (4 tablets) days 1-5 of 28 day cycle (will restart today)  - Continue dasatinib 70 mg daily (consider increasing to 100 mg daily once ANC stabilizes)  - # 9 of 12 IT therapy complete; #10 scheduled for 1/8  - Continue weekly labs for both CBC/diff (assess need for transfusions) and for CMV levels   - BCR-ABL pending, has been negative   - RTC in 4 weeks for C4. Will follow weekly labs with reminders    Infection   -Continue dapsone 100 mg PO daily for PJP ppx  -REDUCE Valcyte to 450 mg BID now that CMV < 100 (now undetectable). Continue to hold valacyclovir 500 mg PO daily. Can follow-up with ICID plan for duration   -Can hold levofloxacin/fluconazole 200 mg since ANC > 0.5  -START IVIG today and continue every 4 weeks     Supportive Care  - Continue Tylenol prn for pain. Check temperature before Tylenol dose and avoid taking NSAIDs.  - Can take Dulcolax prn for constipation  - Can take famotidine if having acid reflex from steroids. Space famotidine 2 hours after dasatinib dose.   -  Will give dose of Lupron. Discussed this can be final dose, than consider alternative contraception. Will START micronor as bone/CV protection and can also trial it for continued use for contraception and menses suppression once off lupron.    F/u:  Future Appointments   Date Time Provider Department Center   07/28/2022  9:00 AM ONCINF HMOB LABS [facility]   07/28/2022  9:30 AM ONCINF HMOB CHAIR 08 ONCINF [facility]   08/08/2022 12:30 PM ADULT ONC LAB UN[facility]ALAB [facility]   08/08/2022  1:30 PM Darel Hong, PA [id number] [facility]   08/09/2022 11:00 AM Ortencia Kick, MD ONCMULTI [facility]   08/23/2022  8:00 AM ADULT ONC LAB [facility]   08/23/2022  9:00 AM ONCHEM LEUKEMIA PHARMACIST [id number]   08/23/2022 10:00 AM Lenon Ahmadi, AGNP [id number]   11/04/2022  1:45 PM Idelia Salm, MD [facility]       I spent 25 minutes in direct patient care      Manfred Arch, PharmD, BCOP, CPP  Pager: [phone]

## 2022-07-27 NOTE — Unmapped (Signed)
Lab on 07/26/2022   Component Date Value Ref Range Status    Sodium 07/26/2022 139  135 - 145 mmol/L Final    Potassium 07/26/2022 4.2  3.4 - 4.8 mmol/L Final    Chloride 07/26/2022 110 (H)  98 - 107 mmol/L Final    CO2 07/26/2022 24.0  20.0 - 31.0 mmol/L Final    Anion Gap 07/26/2022 5  5 - 14 mmol/L Final    BUN 07/26/2022 19  9 - 23 mg/dL Final    Creatinine 16/05/9603 0.64  0.55 - 1.02 mg/dL Final    BUN/Creatinine Ratio 07/26/2022 30   Final    eGFR CKD-EPI (2021) Female 07/26/2022 >90  >=60 mL/min/1.21m2 Final    eGFR calculated with CKD-EPI 2021 equation in accordance with [employer] and [employer] of Nephrology Task Force recommendations.    Glucose 07/26/2022 116  70 - 179 mg/dL Final    Calcium 54/04/8118 8.7  8.7 - 10.4 mg/dL Final    Albumin 14/78/2956 3.7  3.4 - 5.0 g/dL Final    Total Protein 07/26/2022 5.8  5.7 - 8.2 g/dL Final    Total Bilirubin 07/26/2022 0.5  0.3 - 1.2 mg/dL Final    AST 21/30/8657 50 (H)  <=34 U/L Final    ALT 07/26/2022 62 (H)  10 - 49 U/L Final    Alkaline Phosphatase 07/26/2022 62  46 - 116 U/L Final    ABO Grouping 07/26/2022 O POS   Final    Antibody Screen 07/26/2022 NEG   Final    WBC 07/26/2022 1.8 (L)  3.6 - 11.2 10*9/L Final    RBC 07/26/2022 2.01 (L)  3.95 - 5.13 10*12/L Final    HGB 07/26/2022 7.9 (L)  11.3 - 14.9 g/dL Final    HCT 84/69/6295 21.9 (L)  34.0 - 44.0 % Final    MCV 07/26/2022 109.2 (H)  77.6 - 95.7 fL Final    MCH 07/26/2022 39.3 (H)  25.9 - 32.4 pg Final    MCHC 07/26/2022 36.0  32.0 - 36.0 g/dL Final    RDW 28/41/3244 19.7 (H)  12.2 - 15.2 % Final    MPV 07/26/2022 6.3 (L)  6.8 - 10.7 fL Final    Platelet 07/26/2022 132 (L)  150 - 450 10*9/L Final    Neutrophils % 07/26/2022 48.6  % Final    Lymphocytes % 07/26/2022 46.9  % Final    Monocytes % 07/26/2022 2.2  % Final    Eosinophils % 07/26/2022 2.0  % Final    Basophils % 07/26/2022 0.3  % Final    Absolute Neutrophils 07/26/2022 0.9 (L)  1.8 - 7.8 10*9/L Final    Absolute Lymphocytes 07/26/2022 0.9 (L)  1.1 - 3.6 10*9/L Final    Absolute Monocytes 07/26/2022 0.0 (L)  0.3 - 0.8 10*9/L Final    Absolute Eosinophils 07/26/2022 0.0  0.0 - 0.5 10*9/L Final    Absolute Basophils 07/26/2022 0.0  0.0 - 0.1 10*9/L Final    Macrocytosis 07/26/2022 Slight (A)  Not Present Final    Anisocytosis 07/26/2022 Moderate (A)  Not Present Final    CMV Viral Ld 07/26/2022 Not Detected  Not Detected Final

## 2022-07-27 NOTE — Unmapped (Signed)
Infusion Progress Note    [name]  Patient Age: 46 y.o.  Encounter Date: 07/26/2022  Allergies   Allergen Reactions    Erythromycin Hives     Other reaction(s): Not available    Other      Pt cant take ibuprofen due to condition.    Sulfa (Sulfonamide Antibiotics) Anaphylaxis     Other reaction(s): Not available    Azithromycin        Reason for visit  Infusion Center visit   Therapy: IVIG  Chief Complaint: Rigors         Assessment/Plan:  Hypersensitivity reaction Grade 1  --Pre-medications: Diphenhydramine 25 mg, Acetaminophen 650 mg   --Symptoms: Rigors  --Amount Infused:114 mL  --Reaction medications: Diphenhydramine 25mg , Famotidine 20mg , Demerol 25mg        Disposition: OK to restart infusion 30 min after symptoms resolve at half rate and increase per prior instructions.       Interval History/HPI:   Ms. Colglazier is a 46 yo female with past medical history of chronic right shoulder and back pain, HTN, anxiety and diagnosed Ph+ ALL. She developed rigors while infusing with IVIG. Denies chest pain, SOB, itching, swelling of the mouth or pain. The infusion was immediately stopped. VSS. Diphenhydramine 25mg , Famotidine 20mg  and Demerol 25mg  administered. Shortly, after receiving the HSR medications her symptoms resolved.       ROS:  All other systems reviewed were negative.      Physical Exam:  Vital Signs:  OXYGEN SAT:  Vitals:    07/26/22 1405 07/26/22 1436 07/26/22 1622 07/26/22 1713   BP: 111/68 139/84 109/72 109/63   Pulse: 86 131 113    Resp: 16 16  16    Temp: 36.8 ??C (98.3 ??F) 37.3 ??C (99.2 ??F) 37.7 ??C (99.8 ??F) 37.5 ??C (99.5 ??F)   TempSrc:  Oral  Oral   SpO2:           General:    Healthy-appearing, resting in no acute distress.  HEENT:   Normocephalic and atraumatic. No scleral icterus or conjunctival injection. Oral mucosa moist without ulceration, erythema, exudate or purpura.  Cardiovascular:    Regular rate, & rhythm. S1/S2 No significant murmurs or gallops.  Respiratory:   Bilateral breath sounds clear to auscultation without adventitious sounds.  No increased work of breathing noted.   Gastrointestinal:    Abdomen soft, nontender, non-distended.  Bowel sounds present in all quadrants.  Skin:   Grossly intact, warm/dry.  Neuro:   Alert, oriented X4.      Results:  Recent Labs     07/26/22  0906   WBC 1.8*   HGB 7.9*   HCT 21.9*   PLT 132*   NEUTROABS 0.9*   LYMPHSABS 0.9*   MONOSABS 0.0*   EOSABS 0.0   BASOSABS 0.0   MACROCYTES Slight*     Recent Labs     07/26/22  0906   NA 139   K 4.2   CL 110*   CO2 24.0   BUN 19   CREATININE 0.64   GLU 116   CALCIUM 8.7   ALBUMIN 3.7   PROT 5.8   BILITOT 0.5   AST 50*   ALT 62*   ALKPHOS 62           I personally spent 30 minutes face-to-face and non-face-to-face in the care of this patient, which includes all pre, intra, and post visit time on the date of service.  Montel Culver, ANP  ADVANCED PRACTICE PROVIDER FOR INFUSION PROGRAM

## 2022-07-27 NOTE — Unmapped (Signed)
Encounter addended by: Ronney Lion, ANP on: 07/27/2022 12:18 PM   Actions taken: Level of Service modified, Clinical Note Signed

## 2022-07-27 NOTE — Unmapped (Unsigned)
Report taken from Meryl Dare., RN. IVIG running when I took over care of pt. Check chart for post-infusion vitals. Pt tolerated chemo well. Port flushed, + blood return. Port de-accessed and pt discharged home.

## 2022-07-28 ENCOUNTER — Ambulatory Visit: Admit: 2022-07-28 | Discharge: 2022-07-29 | Payer: PRIVATE HEALTH INSURANCE

## 2022-07-28 ENCOUNTER — Institutional Professional Consult (permissible substitution): Admit: 2022-07-28 | Discharge: 2022-07-29 | Payer: PRIVATE HEALTH INSURANCE

## 2022-07-28 DIAGNOSIS — C9100 Acute lymphoblastic leukemia not having achieved remission: Principal | ICD-10-CM

## 2022-07-28 LAB — CBC W/ AUTO DIFF
BASOPHILS ABSOLUTE COUNT: 0 10*9/L (ref 0.0–0.1)
BASOPHILS RELATIVE PERCENT: 0.5 %
EOSINOPHILS ABSOLUTE COUNT: 0 10*9/L (ref 0.0–0.5)
EOSINOPHILS RELATIVE PERCENT: 0.1 %
HEMATOCRIT: 22.7 % — ABNORMAL LOW (ref 34.0–44.0)
HEMOGLOBIN: 8.2 g/dL — ABNORMAL LOW (ref 11.3–14.9)
LYMPHOCYTES ABSOLUTE COUNT: 1 10*9/L — ABNORMAL LOW (ref 1.1–3.6)
LYMPHOCYTES RELATIVE PERCENT: 34 %
MEAN CORPUSCULAR HEMOGLOBIN CONC: 36.1 g/dL — ABNORMAL HIGH (ref 32.0–36.0)
MEAN CORPUSCULAR HEMOGLOBIN: 40.3 pg — ABNORMAL HIGH (ref 25.9–32.4)
MEAN CORPUSCULAR VOLUME: 111.5 fL — ABNORMAL HIGH (ref 77.6–95.7)
MEAN PLATELET VOLUME: 7.6 fL (ref 6.8–10.7)
MONOCYTES ABSOLUTE COUNT: 0.1 10*9/L — ABNORMAL LOW (ref 0.3–0.8)
MONOCYTES RELATIVE PERCENT: 4.6 %
NEUTROPHILS ABSOLUTE COUNT: 1.7 10*9/L — ABNORMAL LOW (ref 1.8–7.8)
NEUTROPHILS RELATIVE PERCENT: 60.8 %
NUCLEATED RED BLOOD CELLS: 0 /100{WBCs} (ref <=4)
PLATELET COUNT: 99 10*9/L — ABNORMAL LOW (ref 150–450)
RED BLOOD CELL COUNT: 2.04 10*12/L — ABNORMAL LOW (ref 3.95–5.13)
RED CELL DISTRIBUTION WIDTH: 19.4 % — ABNORMAL HIGH (ref 12.2–15.2)
WBC ADJUSTED: 2.8 10*9/L — ABNORMAL LOW (ref 3.6–11.2)

## 2022-07-28 MED ADMIN — sodium chloride (NS) 0.9 % infusion: 50 mL/h | INTRAVENOUS | @ 16:00:00

## 2022-07-28 MED ADMIN — heparin, porcine (PF) 100 unit/mL injection 500 Units: 500 [IU] | INTRAVENOUS | @ 18:00:00 | Stop: 2022-07-29

## 2022-07-28 MED ADMIN — heparin, porcine (PF) 100 unit/mL injection 500 Units: 500 [IU] | INTRAVENOUS | @ 14:00:00 | Stop: 2022-07-28

## 2022-07-28 MED ADMIN — acetaminophen (TYLENOL) tablet 650 mg: 650 mg | ORAL | @ 15:00:00 | Stop: 2022-07-28

## 2022-07-28 NOTE — Unmapped (Cosign Needed)
Oncology Massage Therapy Note     Name: Patricia [name]  MRN: [id number]  Date: 07/28/2022     Current Oncology Treatment: Chemotherapy     None     None     Mask     Observations  Patient presented seated in recliner in Infusion area.  Upon offer, patient stated they were amenable to massage therapy today  Patient appeared calm and relaxed during the massage session       Provided gentle effleurage and light compression massage to shoulders for 15 minutes     Patient Evaluation  Before Massage  Patient stated she was tense     After Massage:     Patient stated she was good, more relaxed

## 2022-07-29 NOTE — Unmapped (Addendum)
Clinical Support on 07/28/2022   Component Date Value Ref Range Status    Blood Type 07/28/2022 O POS   Final    Antibody Screen 07/28/2022 NEG   Final    WBC 07/28/2022 2.8 (L)  3.6 - 11.2 10*9/L Final    RBC 07/28/2022 2.04 (L)  3.95 - 5.13 10*12/L Final    HGB 07/28/2022 8.2 (L)  11.3 - 14.9 g/dL Final    HCT 16/05/9603 22.7 (L)  34.0 - 44.0 % Final    MCV 07/28/2022 111.5 (H)  77.6 - 95.7 fL Final    MCH 07/28/2022 40.3 (H)  25.9 - 32.4 pg Final    MCHC 07/28/2022 36.1 (H)  32.0 - 36.0 g/dL Final    RDW 54/04/8118 19.4 (H)  12.2 - 15.2 % Final    MPV 07/28/2022 7.6  6.8 - 10.7 fL Final    Platelet 07/28/2022 99 (L)  150 - 450 10*9/L Final    nRBC 07/28/2022 0  <=4 /100 WBCs Final    Neutrophils % 07/28/2022 60.8  % Final    Lymphocytes % 07/28/2022 34.0  % Final    Monocytes % 07/28/2022 4.6  % Final    Eosinophils % 07/28/2022 0.1  % Final    Basophils % 07/28/2022 0.5  % Final    Absolute Neutrophils 07/28/2022 1.7 (L)  1.8 - 7.8 10*9/L Final    Absolute Lymphocytes 07/28/2022 1.0 (L)  1.1 - 3.6 10*9/L Final    Absolute Monocytes 07/28/2022 0.1 (L)  0.3 - 0.8 10*9/L Final    Absolute Eosinophils 07/28/2022 0.0  0.0 - 0.5 10*9/L Final    Absolute Basophils 07/28/2022 0.0  0.0 - 0.1 10*9/L Final    Macrocytosis 07/28/2022 Slight (A)  Not Present Final    Anisocytosis 07/28/2022 Moderate (A)  Not Present Final          RED ZONE Means: RED ZONE: Take action now!     You need to be seen right away  Symptoms are at a severe level of discomfort    Call 911 or go to your nearest  Hospital for help     - Bleeding that will not stop    - Hard to breathe    - New seizure - Chest pain  - Fall or passing out  -Thoughts of hurting    yourself or others      Call 911 if you are going into the RED ZONE                  YELLOW ZONE Means:     Please call with any new or worsening symptom(s), even if not on this list.  Call [phone]  After hours, weekends, and holidays - you will reach a long recording with specific instructions, If not in an emergency such as above, please listen closely all the way to the end and choose the option that relates to your need.   You can be seen by a provider the same day through our Same Day Acute Care for Patients with Cancer program.      YELLOW ZONE: Take action today     Symptoms are new or worsening  You are not within your goal range for:    - Pain    - Shortness of breath    - Bleeding (nose, urine, stool, wound)    - Feeling sick to your stomach and throwing up    - Mouth sores/pain in your mouth or throat    -  Hard stool or very loose stools (increase in       ostomy output)    - No urine for 12 hours    - Feeding tube or other catheter/tube issue    - Redness or pain at previous IV or port/catheter site    - Depressed or anxiety   - Swelling (leg, arm, abdomen,     face, neck)  - Skin rash or skin changes  - Wound issues (redness, drainage,    re-opened)  - Confusion  - Vision changes  - Fever >100.4 F or chills  - Worsening cough with mucus that is    green, yellow, or bloody  - Pain or burning when going to the    bathroom  - Home Infusion Pump Issue- call    [phone]         Call your healthcare provider if you are going into the YELLOW ZONE     GREEN ZONE Means:  Your symptoms are under controls  Continue to take your medicine as ordered  Keep all visits to the provider GREEN ZONE: You are in control  No increase or worsening symptoms  Able to take your medicine  Able to drink and eat    - DO NOT use MyChart messages to report red or yellow symptoms. Allow up to 3    business days for a reply.  -MyChart is for non-urgent medication refills, scheduling requests, or other general questions.         [id number] Rev. 01/28/2022  Approved by Oncology Patient Education Committee

## 2022-07-29 NOTE — Unmapped (Addendum)
Patient arrived to infusion chair ambulatory accompanied by husband for scheduled transfusion appointment. Questioned patient on status and any symptoms of anemia she has been experiencing in past few days and since she was seen on 12/26 at outpatient infusion in [facility]. Patient reports severe fatigue. Port accessed at earlier lab/port appointment labs drawn and remains accessed. HGB improved since 12/26 to 8.2.Patient reports she would like blood due to her side effects and symptoms. T&S already sent today and blood order was sent for 1 unit PRBC's. Consent viewed in media .Premedicated with tylenol but she has taken her cetrizine at home. Port accessed earlier at visit. Blood ordered and she will receive 1 unit red cells. No signs off reaction note. VS taken as per standard and remain stable throughout. IV tubing flushed following transfusion until no blood was observed in tubing. Port with blood return noted. Port flushed, heparin instilled and needle removed. Site dressed with 2x2 and band aid and no bleeding noted. Patient declined printed copy of AVS and left infusion clinic ambulatory accompanied by husband and verbalizing no complaints.

## 2022-08-03 ENCOUNTER — Ambulatory Visit: Admit: 2022-08-03 | Discharge: 2022-08-04 | Payer: PRIVATE HEALTH INSURANCE

## 2022-08-03 NOTE — Unmapped (Signed)
Therapy Update Follow Up: No issues - Copay = $5

## 2022-08-03 NOTE — Unmapped (Signed)
[facility] Clinical Assessment & Refill Coordination Note    Patricia [name], DOB: September 30, 1975  Phone: [phone] (home)     All above HIPAA information was verified with patient.     Was a Nurse, learning disability used for this call? No    Specialty Medication(s):   Hematology/Oncology: Sprycel 70mg  and valganciclovir 450 mg     Current Outpatient Medications   Medication Sig Dispense Refill    acetaminophen (TYLENOL 8 HOUR) 650 MG CR tablet Take 2 tablets (1,300 mg total) by mouth every eight (8) hours as needed for pain.      albuterol HFA 90 mcg/actuation inhaler Inhale 2 puffs every six (6) hours as needed for wheezing.      cetirizine (ZYRTEC) 10 MG tablet Take 1 tablet (10 mg total) by mouth nightly. 30 tablet 2    dapsone 100 MG tablet TAKE 1 TABLET(100 MG) BY MOUTH DAILY 30 tablet 11    dasatinib (SPRYCEL) 70 MG tablet Take 1 tablet (70 mg total) by mouth daily. 30 tablet 7    escitalopram oxalate (LEXAPRO) 10 MG tablet Take 1 tablet (10 mg total) by mouth daily. 90 tablet 0    fluconazole (DIFLUCAN) 200 MG tablet Take 1 tablet (200 mg total) by mouth daily. (Patient not taking: Reported on 07/26/2022)      levalbuterol (XOPENEX) 0.31 mg/3 mL nebulizer solution Inhale 3 mL (0.31 mg total) by nebulization every four (4) hours as needed for wheezing (cough). 45 mL 0    levoFLOXacin (LEVAQUIN) 500 MG tablet Take 1 tablet (500 mg total) by mouth daily. (Patient not taking: Reported on 07/26/2022)      norethindrone (MICRONOR) 0.35 mg tablet Take 1 tablet by mouth daily. 84 tablet 3    predniSONE (DELTASONE) 50 MG tablet Take 4 tablets (200 mg total) by mouth once daily on days 1-5 of each cycle. 20 tablet 5    riboflavin, vitamin B2, 400 mg Tab Take 400 mg by mouth in the morning. 30 tablet 6    tizanidine (ZANAFLEX) 4 MG tablet Take 1 tablet (4 mg total) by mouth nightly. 90 tablet 2    triamcinolone (KENALOG) 0.1 % cream Apply topically two (2) times a day for 14 days. 45 g 0 valGANciclovir (VALCYTE) 450 mg tablet Take 1 tablet (450 mg total) by mouth two (2) times a day. 60 tablet 2     No current facility-administered medications for this visit.        Changes to medications: Loretta reports no changes at this time.    Allergies   Allergen Reactions    Erythromycin Hives     Other reaction(s): Not available    Other      Pt cant take ibuprofen due to condition.    Sulfa (Sulfonamide Antibiotics) Anaphylaxis     Other reaction(s): Not available    Azithromycin        Changes to allergies: No    SPECIALTY MEDICATION ADHERENCE     Sprycel 70 mg: 10 days of medicine on hand   valganciclovir 450 mg: 14 days of medicine on hand     Are there any concerns with adherence? No    Adherence counseling provided? Not needed    Patient-Reported Symptoms Tracker for Cancer Patients on Oral Chemotherapy     Oral chemotherapy medication name(s): Sprycel  Dose and frequency: 70 mg once daily  Oral Chemotherapy Start Date:    Baseline? No  Clinic(s) visited:  Symptom Grouping Question Patient Response   Digestion and Eating Have you felt sick to your stomach? Denies    Had diarrhea? Denies    Constipated? Denies    Not wanting to eat? Denies    Comments      Sleep and Pain Felt very tired even after you rest? Denies    Pain due to cancer medication or cancer? Denies    Comments     Other Side Effects Numbness or tingling in hands and/or feet? Denies    Felt short of breath? Denies    Mouth or throat Sores? Denies    Rash? Denies    Palmar-plantar erythrodysesthesia syndrome?      Rash - acneiform?      Rash - maculo-papular?      How many days over the past month did your cancer medication or cancer keep you from your normal activities?  Write in number of days, 0-30:  0    Other side effects or things you would like to discuss? NA    Comments?     Adherence  In the last 30 days, on how many days did you miss at least one dose of any of your [drug name]? Write in number of days, 0-30:  0    What reasons are you having trouble taking your medication [pharmacist: check all that apply]? Specify chemotherapy cycle:        No problems identified    Comments:        Comments       Optional Symptom Tracking Comments: none    CLINICAL MANAGEMENT AND INTERVENTION      Clinical Benefit Assessment:    Do you feel the medicine is effective or helping your condition? Yes    Clinical Benefit counseling provided? Not needed    Acute Infection Status:    Acute infections noted within Epic:  No active infections    Patient reported infection: None    Therapy Appropriateness:    Is therapy appropriate and patient progressing towards therapeutic goals? Yes, therapy is appropriate and should be continued    DISEASE/MEDICATION-SPECIFIC INFORMATION      N/A    Is the patient receiving adequate infection prevention treatment? Yes, valganciclovir    Does the patient have adequate nutritional support? Not applicable    PATIENT SPECIFIC NEEDS     Does the patient have any physical, cognitive, or cultural barriers? No    Is the patient high risk? No    Did the patient require a clinical intervention? No    Does the patient require physician intervention or other additional services (i.e., nutrition, smoking cessation, social work)? No    SOCIAL DETERMINANTS OF HEALTH     At the [facility], we have learned that life circumstances - like trouble affording food, housing, utilities, or transportation can affect the health of many of our patients.   That is why we wanted to ask: are you currently experiencing any life circumstances that are negatively impacting your health and/or quality of life? No    Social Determinants of Health     Financial Resource Strain: Low Risk  (11/02/2021)    Overall Financial Resource Strain (CARDIA)     Difficulty of Paying Living Expenses: Not hard at all   Internet Connectivity: Not on file   Food Insecurity: No Food Insecurity (11/02/2021)    Hunger Vital Sign     Worried About Running Out of Food in the Last Year: Never true  Ran Out of Food in the Last Year: Never true   Tobacco Use: Medium Risk (07/19/2022)    Patient History     Smoking Tobacco Use: Former     Smokeless Tobacco Use: Never     Passive Exposure: Not on file   Housing/Utilities: Low Risk  (11/02/2021)    Housing/Utilities     Within the past 12 months, have you ever stayed: outside, in a car, in a tent, in an overnight shelter, or temporarily in someone else's home (i.e. couch-surfing)?: No     Are you worried about losing your housing?: No     Within the past 12 months, have you been unable to get utilities (heat, electricity) when it was really needed?: No   Alcohol Use: Not on file   Transportation Needs: No Transportation Needs (11/02/2021)    PRAPARE - Therapist, art (Medical): No     Lack of Transportation (Non-Medical): No   Substance Use: Not on file   Health Literacy: Not on file   Physical Activity: Not on file   Interpersonal Safety: Not on file   Stress: Not on file   Intimate Partner Violence: Not on file   Depression: Not on file   Social Connections: Not on file     Would you be willing to receive help with any of the needs that you have identified today? Not applicable       SHIPPING     Specialty Medication(s) to be Shipped:   Hematology/Oncology: Sprycel 70mg  and valganciclovir 450 mg    Other medication(s) to be shipped: No additional medications requested for fill at this time     Changes to insurance: No    Delivery Scheduled: Yes, Expected medication delivery date: 08/09/22.     Medication will be delivered via Next Day Courier to the confirmed prescription address in [facility].    The patient will receive a drug information handout for each medication shipped and additional FDA Medication Guides as required.  Verified that patient has previously received a Conservation officer, historic buildings and a Surveyor, mining.    The patient or caregiver noted above participated in the development of this care plan and knows that they can request review of or adjustments to the care plan at any time.      All of the patient's questions and concerns have been addressed.    Kermit Balo, [facility]   [facility] Specialty Pharmacist

## 2022-08-04 ENCOUNTER — Other Ambulatory Visit: Admit: 2022-08-04 | Discharge: 2022-08-05 | Payer: PRIVATE HEALTH INSURANCE

## 2022-08-04 DIAGNOSIS — C9100 Acute lymphoblastic leukemia not having achieved remission: Principal | ICD-10-CM

## 2022-08-04 DIAGNOSIS — B259 Cytomegaloviral disease, unspecified: Principal | ICD-10-CM

## 2022-08-04 LAB — COMPREHENSIVE METABOLIC PANEL
ALBUMIN: 3.7 g/dL (ref 3.4–5.0)
ALKALINE PHOSPHATASE: 59 U/L (ref 46–116)
ALT (SGPT): 105 U/L — ABNORMAL HIGH (ref 10–49)
ANION GAP: 8 mmol/L (ref 5–14)
AST (SGOT): 46 U/L — ABNORMAL HIGH (ref <=34)
BILIRUBIN TOTAL: 0.8 mg/dL (ref 0.3–1.2)
BLOOD UREA NITROGEN: 15 mg/dL (ref 9–23)
BUN / CREAT RATIO: 22
CALCIUM: 9.1 mg/dL (ref 8.7–10.4)
CHLORIDE: 107 mmol/L (ref 98–107)
CO2: 27.6 mmol/L (ref 20.0–31.0)
CREATININE: 0.67 mg/dL
EGFR CKD-EPI (2021) FEMALE: >90 mL/min/{1.73_m2} (ref >=60)
GLUCOSE RANDOM: 141 mg/dL (ref 70–179)
POTASSIUM: 3.7 mmol/L (ref 3.4–4.8)
PROTEIN TOTAL: 6.1 g/dL (ref 5.7–8.2)
SODIUM: 143 mmol/L (ref 135–145)

## 2022-08-04 LAB — CBC W/ AUTO DIFF
BASOPHILS ABSOLUTE COUNT: 0 10*9/L (ref 0.0–0.1)
BASOPHILS RELATIVE PERCENT: 0.4 %
EOSINOPHILS ABSOLUTE COUNT: 0 10*9/L (ref 0.0–0.5)
EOSINOPHILS RELATIVE PERCENT: 0.7 %
HEMATOCRIT: 26.3 % — ABNORMAL LOW (ref 34.0–44.0)
HEMOGLOBIN: 9 g/dL — ABNORMAL LOW (ref 11.3–14.9)
LYMPHOCYTES ABSOLUTE COUNT: 0.9 10*9/L — ABNORMAL LOW (ref 1.1–3.6)
LYMPHOCYTES RELATIVE PERCENT: 49.6 %
MEAN CORPUSCULAR HEMOGLOBIN CONC: 34.2 g/dL (ref 32.0–36.0)
MEAN CORPUSCULAR HEMOGLOBIN: 38.2 pg — ABNORMAL HIGH (ref 25.9–32.4)
MEAN CORPUSCULAR VOLUME: 111.5 fL — ABNORMAL HIGH (ref 77.6–95.7)
MEAN PLATELET VOLUME: 8.4 fL (ref 6.8–10.7)
MONOCYTES ABSOLUTE COUNT: 0.3 10*9/L (ref 0.3–0.8)
MONOCYTES RELATIVE PERCENT: 19.9 %
NEUTROPHILS ABSOLUTE COUNT: 0.5 10*9/L — ABNORMAL LOW (ref 1.8–7.8)
NEUTROPHILS RELATIVE PERCENT: 29.4 %
NUCLEATED RED BLOOD CELLS: 1 /100{WBCs} (ref <=4)
PLATELET COUNT: 116 10*9/L — ABNORMAL LOW (ref 150–450)
RED BLOOD CELL COUNT: 2.36 10*12/L — ABNORMAL LOW (ref 3.95–5.13)
RED CELL DISTRIBUTION WIDTH: 26.2 % — ABNORMAL HIGH (ref 12.2–15.2)
WBC ADJUSTED: 1.7 10*9/L — ABNORMAL LOW (ref 3.6–11.2)

## 2022-08-04 LAB — LACTATE DEHYDROGENASE: LACTATE DEHYDROGENASE: 505 U/L — ABNORMAL HIGH (ref 120–246)

## 2022-08-05 DIAGNOSIS — C9100 Acute lymphoblastic leukemia not having achieved remission: Principal | ICD-10-CM

## 2022-08-05 LAB — CMV DNA, QUANTITATIVE, PCR: CMV VIRAL LD: NOT DETECTED

## 2022-08-05 MED ORDER — LEVOFLOXACIN 500 MG TABLET
ORAL_TABLET | ORAL | 2 refills | 30 days | Status: CP
Start: 2022-08-05 — End: ?

## 2022-08-05 MED ORDER — FLUCONAZOLE 200 MG TABLET
ORAL_TABLET | Freq: Every day | ORAL | 2 refills | 30 days | Status: CP
Start: 2022-08-05 — End: ?

## 2022-08-08 ENCOUNTER — Other Ambulatory Visit: Admit: 2022-08-08 | Discharge: 2022-08-08 | Payer: PRIVATE HEALTH INSURANCE

## 2022-08-08 ENCOUNTER — Ambulatory Visit: Admit: 2022-08-08 | Discharge: 2022-08-08 | Payer: PRIVATE HEALTH INSURANCE

## 2022-08-08 LAB — COMPREHENSIVE METABOLIC PANEL
ALBUMIN: 3.8 g/dL (ref 3.4–5.0)
ALKALINE PHOSPHATASE: 60 U/L (ref 46–116)
ALT (SGPT): 92 U/L — ABNORMAL HIGH (ref 10–49)
ANION GAP: 6 mmol/L (ref 5–14)
AST (SGOT): 58 U/L — ABNORMAL HIGH (ref <=34)
BILIRUBIN TOTAL: 0.6 mg/dL (ref 0.3–1.2)
BLOOD UREA NITROGEN: 15 mg/dL (ref 9–23)
BUN / CREAT RATIO: 20
CALCIUM: 9 mg/dL (ref 8.7–10.4)
CHLORIDE: 109 mmol/L — ABNORMAL HIGH (ref 98–107)
CO2: 25 mmol/L (ref 20.0–31.0)
CREATININE: 0.74 mg/dL
EGFR CKD-EPI (2021) FEMALE: >90 mL/min/{1.73_m2} (ref >=60)
GLUCOSE RANDOM: 183 mg/dL — ABNORMAL HIGH (ref 70–179)
POTASSIUM: 3.8 mmol/L (ref 3.4–4.8)
PROTEIN TOTAL: 6.4 g/dL (ref 5.7–8.2)
SODIUM: 140 mmol/L (ref 135–145)

## 2022-08-08 LAB — CBC W/ AUTO DIFF
BASOPHILS ABSOLUTE COUNT: 0 10*9/L (ref 0.0–0.1)
BASOPHILS RELATIVE PERCENT: 0.7 %
EOSINOPHILS ABSOLUTE COUNT: 0 10*9/L (ref 0.0–0.5)
EOSINOPHILS RELATIVE PERCENT: 0.9 %
HEMATOCRIT: 30.4 % — ABNORMAL LOW (ref 34.0–44.0)
HEMOGLOBIN: 10.5 g/dL — ABNORMAL LOW (ref 11.3–14.9)
LYMPHOCYTES ABSOLUTE COUNT: 1.1 10*9/L (ref 1.1–3.6)
LYMPHOCYTES RELATIVE PERCENT: 48.4 %
MEAN CORPUSCULAR HEMOGLOBIN CONC: 34.5 g/dL (ref 32.0–36.0)
MEAN CORPUSCULAR HEMOGLOBIN: 38.7 pg — ABNORMAL HIGH (ref 25.9–32.4)
MEAN CORPUSCULAR VOLUME: 112.3 fL — ABNORMAL HIGH (ref 77.6–95.7)
MEAN PLATELET VOLUME: 7.7 fL (ref 6.8–10.7)
MONOCYTES ABSOLUTE COUNT: 0.4 10*9/L (ref 0.3–0.8)
MONOCYTES RELATIVE PERCENT: 17 %
NEUTROPHILS ABSOLUTE COUNT: 0.7 10*9/L — ABNORMAL LOW (ref 1.8–7.8)
NEUTROPHILS RELATIVE PERCENT: 33 %
PLATELET COUNT: 147 10*9/L — ABNORMAL LOW (ref 150–450)
RED BLOOD CELL COUNT: 2.71 10*12/L — ABNORMAL LOW (ref 3.95–5.13)
RED CELL DISTRIBUTION WIDTH: 24.4 % — ABNORMAL HIGH (ref 12.2–15.2)
WBC ADJUSTED: 2.2 10*9/L — ABNORMAL LOW (ref 3.6–11.2)

## 2022-08-08 LAB — SLIDE REVIEW

## 2022-08-08 LAB — HEMATOPATHOLOGY LEUKEMIA/LYMPHOMA FLOW CYTOMETRY, CSF
NUCLEATED CELLS, CSF: 2 ul (ref 0–5)
RBC CSF: 2 ul — ABNORMAL HIGH

## 2022-08-08 LAB — LACTATE DEHYDROGENASE: LACTATE DEHYDROGENASE: 523 U/L — ABNORMAL HIGH (ref 120–246)

## 2022-08-08 MED ADMIN — heparin, porcine (PF) 100 unit/mL injection 500 Units: 500 [IU] | INTRAVENOUS | @ 21:00:00 | Stop: 2022-08-09

## 2022-08-08 MED ADMIN — LORazepam (ATIVAN) injection 1.5 mg: 1.5 mg | INTRAVENOUS | @ 19:00:00 | Stop: 2022-08-08

## 2022-08-08 MED ADMIN — methotrexate (Preservative Free) 12 mg, hydrocortisone sod succ (Solu-CORTEF) 50 mg in sodium chloride (NS) 0.9 % 6 mL INTRATHECAL syringe: INTRATHECAL | @ 19:00:00 | Stop: 2022-08-08

## 2022-08-08 MED FILL — SPRYCEL 70 MG TABLET: ORAL | 30 days supply | Qty: 30 | Fill #5

## 2022-08-08 NOTE — Unmapped (Signed)
Patient arrived to room 10 for LP with IT Chemo. Patient consented for procedure by NP, all questions/concerns addressed. Procedure completed w/o complication, patient remained supine for 1 hour post procedure. Patient was then discharged to home by NP after port was de-accessed.

## 2022-08-08 NOTE — Unmapped (Signed)
Lumbar Puncture Procedure Note    Today's Date: 08/08/2022    Diagnosis:   Patient Active Problem List   Diagnosis    Leukocytosis    B-cell acute lymphoblastic leukemia (ALL) (CMS-HCC)    Acute cough    Dyspepsia    Acute frontal sinusitis    Pancytopenia (CMS-HCC)    Fever and neutropenia (CMS-HCC)    Blurred vision, bilateral    Anxiety    Hypogammaglobulinemia (CMS-HCC)    Hypokalemia       Indications:    Diagnosis ICD-10-CM Associated Orders   1. B-cell acute lymphoblastic leukemia (ALL) (CMS-HCC)  C91.00 CSF protein     Glucose, CSF     Hempath Leukemia Flowcytometry, CSF     methotrexate (Preservative Free) 12 mg, hydrocortisone sod succ (Solu-CORTEF) 50 mg in sodium chloride (NS) 0.9 % 6 mL INTRATHECAL syringe     CSF protein     CSF protein     Glucose, CSF     Glucose, CSF     Hempath Leukemia Flowcytometry, CSF     Hempath Leukemia Flowcytometry, CSF     LORazepam (ATIVAN) injection 1.5 mg          Premedication: Ativan 1.5mg  IV    Clinician(s) Performing Procedure:  Abhi Moccia K. Thera Flake, PA-C    Procedure Details     Consent: The risks, benefits, and alternatives to the procedure were discussed. All questions were answered. Consent was obtained and witnessed.    A time-out in which Her patient identifiers were checked by 2 providers was performed.    The patient was positioned under sterile conditions. Betadine solution and sterile drapes were utilized. Local anesthesia with 1% lidocaine was applied subcutaneously then deep to the skin. A spinal needle was inserted at the L3 - L4 interspace. Spinal fluid was obtained and sent to the laboratory.    Intrathecal MTX was administered over 4 min.    The spinal needle with trocar was removed with minimal bleeding noted upon removal. A sterile bandage was placed over the puncture site after holding pressure.        Findings  6mL of clear spinal fluid was obtained.    Complications:  None; patient tolerated the procedure well.          Condition: stable    Plan  Bed rest for 1 hours.  Tylenol 650 mg for pain.  Call office if you develop a severe headache, nausea, vomiting, or fever greater than 100.5 F.

## 2022-08-08 NOTE — Unmapped (Signed)
Please leave dressing in place and keep it dry for 24 hrs before removing. You can resume normal activities tomorrow, but take things easy today.     You may take tylenol as needed for discomfort at the area where the biopsy was taken.   After receiving Ativan, please do not drive today.     If you have questions between 8am to 5 pm Monday through Friday please call [phone] and speak to the operator.      For emergencies, evenings or weekends, please call [phone] and ask for oncology fellow on call.     Reasons to call emergency line may include:   Fever of 100.5 or greater   Nausea and/or vomiting not relieved with nausea medicine   Diarrhea or constipation   Severe pain not relieved with usual pain regimen       Lab on 08/08/2022   Component Date Value Ref Range Status    LDH 08/08/2022 523 (H)  120 - 246 U/L Final    Sodium 08/08/2022 140  135 - 145 mmol/L Final    Potassium 08/08/2022 3.8  3.4 - 4.8 mmol/L Final    Chloride 08/08/2022 109 (H)  98 - 107 mmol/L Final    CO2 08/08/2022 25.0  20.0 - 31.0 mmol/L Final    Anion Gap 08/08/2022 6  5 - 14 mmol/L Final    BUN 08/08/2022 15  9 - 23 mg/dL Final    Creatinine 29/56/2130 0.74  0.55 - 1.02 mg/dL Final    BUN/Creatinine Ratio 08/08/2022 20   Final    eGFR CKD-EPI (2021) Female 08/08/2022 >90  >=60 mL/min/1.74m2 Final    eGFR calculated with CKD-EPI 2021 equation in accordance with [employer] and [employer] of Nephrology Task Force recommendations.    Glucose 08/08/2022 183 (H)  70 - 179 mg/dL Final    Calcium 86/57/8469 9.0  8.7 - 10.4 mg/dL Final    Albumin 62/95/2841 3.8  3.4 - 5.0 g/dL Final    Total Protein 08/08/2022 6.4  5.7 - 8.2 g/dL Final    Total Bilirubin 08/08/2022 0.6  0.3 - 1.2 mg/dL Final    AST 32/44/0102 58 (H)  <=34 U/L Final    ALT 08/08/2022 92 (H)  10 - 49 U/L Final    Alkaline Phosphatase 08/08/2022 60  46 - 116 U/L Final    WBC 08/08/2022 2.2 (L)  3.6 - 11.2 10*9/L Final    RBC 08/08/2022 2.71 (L)  3.95 - 5.13 10*12/L Final    HGB 08/08/2022 10.5 (L)  11.3 - 14.9 g/dL Final    HCT 72/53/6644 30.4 (L)  34.0 - 44.0 % Final    MCV 08/08/2022 112.3 (H)  77.6 - 95.7 fL Final    MCH 08/08/2022 38.7 (H)  25.9 - 32.4 pg Final    MCHC 08/08/2022 34.5  32.0 - 36.0 g/dL Final    RDW 03/47/4259 24.4 (H)  12.2 - 15.2 % Final    MPV 08/08/2022 7.7  6.8 - 10.7 fL Final    Platelet 08/08/2022 147 (L)  150 - 450 10*9/L Final    Neutrophils % 08/08/2022 33.0  % Final    Lymphocytes % 08/08/2022 48.4  % Final    Monocytes % 08/08/2022 17.0  % Final    Eosinophils % 08/08/2022 0.9  % Final    Basophils % 08/08/2022 0.7  % Final    Absolute Neutrophils 08/08/2022 0.7 (L)  1.8 - 7.8 10*9/L Final  Absolute Lymphocytes 08/08/2022 1.1  1.1 - 3.6 10*9/L Final    Absolute Monocytes 08/08/2022 0.4  0.3 - 0.8 10*9/L Final    Absolute Eosinophils 08/08/2022 0.0  0.0 - 0.5 10*9/L Final    Absolute Basophils 08/08/2022 0.0  0.0 - 0.1 10*9/L Final    Macrocytosis 08/08/2022 Moderate (A)  Not Present Final    Anisocytosis 08/08/2022 Marked (A)  Not Present Final

## 2022-08-09 ENCOUNTER — Telehealth
Admit: 2022-08-09 | Discharge: 2022-08-10 | Payer: PRIVATE HEALTH INSURANCE | Attending: Student in an Organized Health Care Education/Training Program | Primary: Student in an Organized Health Care Education/Training Program

## 2022-08-09 DIAGNOSIS — C9100 Acute lymphoblastic leukemia not having achieved remission: Principal | ICD-10-CM

## 2022-08-09 LAB — CMV DNA, QUANTITATIVE, PCR
CMV QUANT: <35 [IU]/mL — ABNORMAL HIGH (ref <0)
CMV VIRAL LD: DETECTED — AB

## 2022-08-09 NOTE — Unmapped (Unsigned)
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 Now in remission and on maintenance therapy.    Assessment/Plan:   #Fevers: Resolved. No recent fevers.  - Cont tylenol 650mg  TID PRN    #Blurred Vision: Stable, she feels is likely attributable to needing reading glasses. Feels like it comes and goes. Hasn't seen eye doctor in years. Is a known side effect of methotrexate. Feeling like she is having more nearsightedness and might need bifocals and/or a new prescription.    #Loose Stools: None recently, except for one day which self-resolved.  - Cont imodium PRN    #Anxiety/Coping Support: Improved. Lots of anxiety about recurrent hospitalization. She put herself back on prior prescription of lexapro (started 9/8). Doing well on her current dose and reports no recent panic attacks. Still has a racing mind at night and occasional heart flutters.  - Cont lexapro 10mg  daily   - Continue working with local therapist - been working with him since brother died  - Feeling hopeful about going back to work in the next few months. 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    #Advanced Care Planning  - She and her husband have an excellent understanding of the current course of her treatment.  She recognizes that she is responding well and there is some uncertainty about how many rounds of treatment she will have to go through.  Has heard that the goal of treatment is curative, and she remains very hopeful for this.  Has been able to tolerate treatment fairly well to this point. Her major concern is about side effects from lumbar punctures.  Current focus is on cancer treatment and survival while maintaining quality of life is much as possible.    Health Care Decision Maker as of 08/09/2022    HCDM (patient stated preference): Patricia [name] Spouse - [phone]      # Controlled substances risk management.   - Patient does not have a signed pain medication agreement with our team.  No controlled substances being prescribed by our team   - [place] database was reviewed today and it was appropriate.   - Urine drug screen was not performed at this visit. Findings: not applicable.   - Patient has received information about safe storage and administration of medications.   - Patient has not received a prescription for narcan; is not applicable.       F/u: 2 months    ----------------------------------------  Referring Provider: From inpatient palliative care team  Oncology Team: Malignant hematology  PCP: [name], MD    Interval Hx, 08/09/2022:    Her hair is growing back in. Is working on drinking more water. She is feeling pretty good today. Blood counts look a bit better.     LP has been easier at higher lumbar spot. Got her last LP chemo this week! Slept for 2 days after and now is feeling a bit better.     Bought an Psychologist, sport and exercise and is using that for humidification.    Feels like mood and anxiety are a bit better. She got a bit more ativan this last time and was a little loopy but noted that she had basically no anxiety. Is still going to have anxiety about is cancer going to come back. Not being able to make money because she can't yet go back to work is a stressor now. Still  waiting on her counts to recover before going back. Has been trying to navigate Social Security disability and they are telling her they owe her money, rather than being given money. Gregary Signs wants to see 2 months of stable ANC before she goes back to work.     Was able to walk through campus yesterday without issues but then got her husband to pick her up on the way back.    Blurry vision has overall been better. Got readers at [facility] and knows she needs to go to [facility] doctor. Has been reading a book that she is hooked on on her phone.    HPI: Ph+ ALL, currently in remission.    Current cancer-directed therapy: maintenance vincristine 2g, Prednisone 200mg  D1-4 +dasatinib       Palliative Performance Scale: 90% - Ambulation: Full / Normal Activity, some evidence of disease / Self-Care:Full / Intake: Normal / Level of Conscious: Full      Coping/Support Issues: See notes from Thornton Papas from recent inpatient admission for details.  They have multiple stressors but overall feels like she is coping fairly well right now.  She will reach out to our palliative care team if new concerns emerge.    Goals of Care:  Focus on cancer directed therapy and survival while hoping to maintain her quality of life is much as possible    Social History:   Name of primary support: Husband Patricia [name]  occupation: 6 grade teacher      Advance Care Planning:   HCPOA: Spouse  Natural surrogate decision maker: Husband Industrial/product designer  Living Will: no  ACP note:     Objective     Opioid Risk Tool:    Female  Female    Family history of substance abuse      Alcohol  1  3    Illegal drugs  2  3    Rx drugs  4  4    Personal history of substance abuse      Alcohol  3  3    Illegal drugs  4  4    Rx drugs  5  5    Age between 16--45 years  1  1    History of preadolescent sexual abuse  3  0    Psychological disease      ADD, OCD, bipolar, schizophrenia  2  2    Depression  1  1       Total: Low  (<3 low risk, 4-7 moderate risk, >8 high risk)    Oncology History Overview Note   Referring/Local Oncologist:    Diagnosis:   10/29/2021  Bone marrow, left iliac, aspiration and biopsy  -  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 The majority of the abnormal cells (64/88) contained an additional BCR/ABL1 fusion signal, while 9/88 abnormal cells contained an additional ABL1 and ASS1 signal.       Genetics:   Karyotype/FISH:   RESULTS   Date Value Ref Range Status   10/28/2021   Final    NOTE: This report reflects a combined study from a peripheral blood and a bone marrow core biopsy. Eleven cells from the peripheral blood and nine  cells from the bone marrow core biopsy were analyzed. The BCR/ABL1 FISH analysis was performed on the peripheral blood.     Abnormal Karyotype:  47,XX,t(9;22)(q34;q11.2),+der(22)t(9;22)[18]/46,XX[2]    Abnormal FISH:  A BCR/ABL1 interphase FISH assay showed a signal pattern consistent with a BCR::ABL1 rearrangement and the 9;22 translocation in 88% of the 100 cells scored. The majority of the abnormal cells (64/88) contained an additional BCR/ABL1 fusion signal, while 9/88 abnormal cells contained an additional ABL1 and ASS1 signal.           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  MRD by flow insufficient, p190 2/100,000  12/10/2021: Cycle 2 B cycle + rituximab  12/14/21: IT#4  12/17/21: IT#5  01/10/22: Cycle 3 A cycle + rituximab  01/11/22: IT #6  01/24/22: Bmbx - MRD-neg by PCR (p190)   02/17/22: C4 B cycle + ritux  02/18/22: IT#7  03/21/22: C5 A cycle + ritux (4th dose)  03/22/22: IT#8  04/29/22: Bmbx - MRD-neg by PCR (p190)   05/18/22: restart dasatinib 70mg  s/p neutropenia  05/26/22: Start maintenance vincristine 2g, Prednisone 200mg  D1-4   06/02/22: IT #9  06/27/22: C2 Maintenance: vincristine 2g, Prednisone 200mg  D1-4, HOLD dasatinib due to neutropenia  06/27/22: IT #10  07/04/22: RESTART dasatinib 70mg   07/21/22: IT #11  07/26/22: C3 Maintenance: vincristine 2g, Prednisone 200mg  D1-4, dasatinib 70 mg     B-cell acute lymphoblastic leukemia (ALL) (CMS-HCC)   10/29/2021 Initial Diagnosis    B-cell acute lymphoblastic leukemia (ALL) (CMS-HCC)     10/30/2021 - 03/30/2022 Chemotherapy    IP/OP LEUKEMIA GRAAPH-2005 < 60 YO (OP PEGFILGRASTIM ON DAY 7)  [No description for this plan]     01/06/2022 - 04/07/2022 Endocrine/Hormone Therapy    OP LEUPROLIDE (LUPRON) 11.25 MG EVERY 3 MONTHS  Plan Provider: [name], MD     05/26/2022 -  Chemotherapy    OP LEUKEMIA VINCRISTINE  vinCRIStine 2 mg IV on day 1     07/26/2022 Endocrine/Hormone Therapy    OP LEUPROLIDE (LUPRON) 11.25 MG EVERY 3 MONTHS  Plan Provider: [name], MD           REVIEW OF SYSTEMS:  A comprehensive review of 10 systems was negative except for pertinent positives noted in HPI.      PHYSICAL EXAM:   Video Visit  GEN: Awake and alert, pleasant appearing female in no acute distress. Hair is growing back.  PSYCH: Alert and oriented to person, place and time. Euthymic.  LUNGS: No increased work of breathing.  SKIN: No rashes, petechiae or jaundice noted          The patient reports they are physically located in [place] and is currently: at home. I conducted a audio/video visit. I spent  32m 46s on the video call with the patient. I spent an additional 15 minutes on pre- and post-visit activities on the date of service .       Maeola Sarah, MD  [facility] Outpatient Oncology Palliative Care currently: at home. I spent 30 minutes on the real-time audio and video with the patient on the date of service. I spent an additional 15 minutes on pre- and post-visit  activities on the date of service.     The patient was physically located in [place] or a state in which I am permitted to provide care. The patient and/or parent/guardian understood that s/he may incur co-pays and cost sharing, and agreed to the telemedicine visit. The visit was reasonable and appropriate under the circumstances given the patient's presentation at the time.    The patient and/or parent/guardian has been advised of the potential risks and limitations of this mode of treatment (including, but not limited to, the absence of in-person examination) and has agreed to be treated using telemedicine. The patient's/patient's family's questions regarding telemedicine have been answered.     If the visit was completed in an ambulatory setting, the patient and/or parent/guardian has also been advised to contact their provider???s office for worsening conditions, and seek emergency medical treatment and/or call 911 if the patient deems either necessary.    Maeola Sarah, MD  [facility] Outpatient Oncology Palliative Care

## 2022-08-11 ENCOUNTER — Other Ambulatory Visit: Admit: 2022-08-11 | Discharge: 2022-08-11 | Payer: PRIVATE HEALTH INSURANCE

## 2022-08-11 DIAGNOSIS — C9100 Acute lymphoblastic leukemia not having achieved remission: Principal | ICD-10-CM

## 2022-08-11 DIAGNOSIS — B259 Cytomegaloviral disease, unspecified: Principal | ICD-10-CM

## 2022-08-11 LAB — CBC W/ AUTO DIFF
BASOPHILS ABSOLUTE COUNT: 0 10*9/L (ref 0.0–0.1)
BASOPHILS RELATIVE PERCENT: 1.7 %
EOSINOPHILS ABSOLUTE COUNT: 0 10*9/L (ref 0.0–0.5)
EOSINOPHILS RELATIVE PERCENT: 0.5 %
HEMATOCRIT: 27.9 % — ABNORMAL LOW (ref 34.0–44.0)
HEMOGLOBIN: 9.5 g/dL — ABNORMAL LOW (ref 11.3–14.9)
LYMPHOCYTES ABSOLUTE COUNT: 0.7 10*9/L — ABNORMAL LOW (ref 1.1–3.6)
LYMPHOCYTES RELATIVE PERCENT: 47.3 %
MEAN CORPUSCULAR HEMOGLOBIN CONC: 34.2 g/dL (ref 32.0–36.0)
MEAN CORPUSCULAR HEMOGLOBIN: 38.7 pg — ABNORMAL HIGH (ref 25.9–32.4)
MEAN CORPUSCULAR VOLUME: 113.3 fL — ABNORMAL HIGH (ref 77.6–95.7)
MEAN PLATELET VOLUME: 6.8 fL (ref 6.8–10.7)
MONOCYTES ABSOLUTE COUNT: 0.2 10*9/L — ABNORMAL LOW (ref 0.3–0.8)
MONOCYTES RELATIVE PERCENT: 11.3 %
NEUTROPHILS ABSOLUTE COUNT: 0.6 10*9/L — ABNORMAL LOW (ref 1.8–7.8)
NEUTROPHILS RELATIVE PERCENT: 39.2 %
NUCLEATED RED BLOOD CELLS: 0 /100{WBCs} (ref <=4)
PLATELET COUNT: 132 10*9/L — ABNORMAL LOW (ref 150–450)
RED BLOOD CELL COUNT: 2.46 10*12/L — ABNORMAL LOW (ref 3.95–5.13)
RED CELL DISTRIBUTION WIDTH: 22.6 % — ABNORMAL HIGH (ref 12.2–15.2)
WBC ADJUSTED: 1.5 10*9/L — ABNORMAL LOW (ref 3.6–11.2)

## 2022-08-11 LAB — COMPREHENSIVE METABOLIC PANEL
ALBUMIN: 3.8 g/dL (ref 3.4–5.0)
ALKALINE PHOSPHATASE: 52 U/L (ref 46–116)
ALT (SGPT): 140 U/L — ABNORMAL HIGH (ref 10–49)
ANION GAP: 8 mmol/L (ref 5–14)
AST (SGOT): 92 U/L — ABNORMAL HIGH (ref <=34)
BILIRUBIN TOTAL: 0.6 mg/dL (ref 0.3–1.2)
BLOOD UREA NITROGEN: 16 mg/dL (ref 9–23)
BUN / CREAT RATIO: 23
CALCIUM: 9.2 mg/dL (ref 8.7–10.4)
CHLORIDE: 110 mmol/L — ABNORMAL HIGH (ref 98–107)
CO2: 24.7 mmol/L (ref 20.0–31.0)
CREATININE: 0.71 mg/dL
EGFR CKD-EPI (2021) FEMALE: >90 mL/min/{1.73_m2} (ref >=60)
GLUCOSE RANDOM: 177 mg/dL (ref 70–179)
POTASSIUM: 3.9 mmol/L (ref 3.4–4.8)
PROTEIN TOTAL: 6.1 g/dL (ref 5.7–8.2)
SODIUM: 143 mmol/L (ref 135–145)

## 2022-08-11 LAB — LACTATE DEHYDROGENASE: LACTATE DEHYDROGENASE: 459 U/L — ABNORMAL HIGH (ref 120–246)

## 2022-08-11 MED ADMIN — heparin, porcine (PF) 100 unit/mL injection 500 Units: 500 [IU] | INTRAVENOUS | @ 18:00:00 | Stop: 2022-08-11

## 2022-08-11 NOTE — Unmapped (Signed)
Results for orders placed or performed in visit on 08/11/22   CBC w/ Differential   Result Value Ref Range    WBC 1.5 (L) 3.6 - 11.2 10*9/L    RBC 2.46 (L) 3.95 - 5.13 10*12/L    HGB 9.5 (L) 11.3 - 14.9 g/dL    HCT 16.1 (L) 09.6 - 44.0 %    MCV 113.3 (H) 77.6 - 95.7 fL    MCH 38.7 (H) 25.9 - 32.4 pg    MCHC 34.2 32.0 - 36.0 g/dL    RDW 04.5 (H) 40.9 - 15.2 %    MPV 6.8 6.8 - 10.7 fL    Platelet 132 (L) 150 - 450 10*9/L    nRBC 0 <=4 /100 WBCs    Neutrophils % 39.2 %    Lymphocytes % 47.3 %    Monocytes % 11.3 %    Eosinophils % 0.5 %    Basophils % 1.7 %    Absolute Neutrophils 0.6 (L) 1.8 - 7.8 10*9/L    Absolute Lymphocytes 0.7 (L) 1.1 - 3.6 10*9/L    Absolute Monocytes 0.2 (L) 0.3 - 0.8 10*9/L    Absolute Eosinophils 0.0 0.0 - 0.5 10*9/L    Absolute Basophils 0.0 0.0 - 0.1 10*9/L    Macrocytosis Moderate (A) Not Present    Anisocytosis Marked (A) Not Present

## 2022-08-18 ENCOUNTER — Other Ambulatory Visit: Admit: 2022-08-18 | Discharge: 2022-08-19 | Payer: PRIVATE HEALTH INSURANCE

## 2022-08-18 DIAGNOSIS — C9100 Acute lymphoblastic leukemia not having achieved remission: Principal | ICD-10-CM

## 2022-08-18 DIAGNOSIS — B259 Cytomegaloviral disease, unspecified: Principal | ICD-10-CM

## 2022-08-18 LAB — CBC W/ AUTO DIFF
BASOPHILS ABSOLUTE COUNT: 0 10*9/L (ref 0.0–0.1)
BASOPHILS RELATIVE PERCENT: 0.6 %
EOSINOPHILS ABSOLUTE COUNT: 0 10*9/L (ref 0.0–0.5)
EOSINOPHILS RELATIVE PERCENT: 1.6 %
HEMATOCRIT: 28.7 % — ABNORMAL LOW (ref 34.0–44.0)
HEMOGLOBIN: 9.9 g/dL — ABNORMAL LOW (ref 11.3–14.9)
LYMPHOCYTES ABSOLUTE COUNT: 0.8 10*9/L — ABNORMAL LOW (ref 1.1–3.6)
LYMPHOCYTES RELATIVE PERCENT: 47 %
MEAN CORPUSCULAR HEMOGLOBIN CONC: 34.4 g/dL (ref 32.0–36.0)
MEAN CORPUSCULAR HEMOGLOBIN: 39.4 pg — ABNORMAL HIGH (ref 25.9–32.4)
MEAN CORPUSCULAR VOLUME: 114.4 fL — ABNORMAL HIGH (ref 77.6–95.7)
MEAN PLATELET VOLUME: 7.2 fL (ref 6.8–10.7)
MONOCYTES ABSOLUTE COUNT: 0.3 10*9/L (ref 0.3–0.8)
MONOCYTES RELATIVE PERCENT: 14.5 %
NEUTROPHILS ABSOLUTE COUNT: 0.6 10*9/L — ABNORMAL LOW (ref 1.8–7.8)
NEUTROPHILS RELATIVE PERCENT: 36.3 %
NUCLEATED RED BLOOD CELLS: 0 /100{WBCs} (ref <=4)
PLATELET COUNT: 121 10*9/L — ABNORMAL LOW (ref 150–450)
RED BLOOD CELL COUNT: 2.51 10*12/L — ABNORMAL LOW (ref 3.95–5.13)
RED CELL DISTRIBUTION WIDTH: 22.3 % — ABNORMAL HIGH (ref 12.2–15.2)
WBC ADJUSTED: 1.8 10*9/L — ABNORMAL LOW (ref 3.6–11.2)

## 2022-08-18 LAB — COMPREHENSIVE METABOLIC PANEL
ALBUMIN: 4 g/dL (ref 3.4–5.0)
ALKALINE PHOSPHATASE: 61 U/L (ref 46–116)
ALT (SGPT): 88 U/L — ABNORMAL HIGH (ref 10–49)
ANION GAP: 8 mmol/L (ref 5–14)
AST (SGOT): 43 U/L — ABNORMAL HIGH (ref <=34)
BILIRUBIN TOTAL: 0.7 mg/dL (ref 0.3–1.2)
BLOOD UREA NITROGEN: 13 mg/dL (ref 9–23)
BUN / CREAT RATIO: 19
CALCIUM: 9.6 mg/dL (ref 8.7–10.4)
CHLORIDE: 108 mmol/L — ABNORMAL HIGH (ref 98–107)
CO2: 26.1 mmol/L (ref 20.0–31.0)
CREATININE: 0.68 mg/dL
EGFR CKD-EPI (2021) FEMALE: >90 mL/min/{1.73_m2} (ref >=60)
GLUCOSE RANDOM: 144 mg/dL (ref 70–179)
POTASSIUM: 3.8 mmol/L (ref 3.4–4.8)
PROTEIN TOTAL: 6.4 g/dL (ref 5.7–8.2)
SODIUM: 142 mmol/L (ref 135–145)

## 2022-08-18 LAB — SLIDE REVIEW

## 2022-08-18 LAB — CMV DNA, QUANTITATIVE, PCR: CMV VIRAL LD: NOT DETECTED

## 2022-08-18 MED ADMIN — heparin, porcine (PF) 100 unit/mL injection 500 Units: 500 [IU] | INTRAVENOUS | @ 17:00:00 | Stop: 2022-08-18

## 2022-08-18 NOTE — Unmapped (Signed)
Lab on 08/18/2022   Component Date Value Ref Range Status    WBC 08/18/2022 1.8 (L)  3.6 - 11.2 10*9/L Final    RBC 08/18/2022 2.51 (L)  3.95 - 5.13 10*12/L Final    HGB 08/18/2022 9.9 (L)  11.3 - 14.9 g/dL Final    HCT 16/05/9603 28.7 (L)  34.0 - 44.0 % Final    MCV 08/18/2022 114.4 (H)  77.6 - 95.7 fL Final    MCH 08/18/2022 39.4 (H)  25.9 - 32.4 pg Final    MCHC 08/18/2022 34.4  32.0 - 36.0 g/dL Final    RDW 54/04/8118 22.3 (H)  12.2 - 15.2 % Final    MPV 08/18/2022 7.2  6.8 - 10.7 fL Final    Platelet 08/18/2022 121 (L)  150 - 450 10*9/L Final    nRBC 08/18/2022 0  <=4 /100 WBCs Final    Neutrophils % 08/18/2022 36.3  % Final    Lymphocytes % 08/18/2022 47.0  % Final    Monocytes % 08/18/2022 14.5  % Final    Eosinophils % 08/18/2022 1.6  % Final    Basophils % 08/18/2022 0.6  % Final    Absolute Neutrophils 08/18/2022 0.6 (L)  1.8 - 7.8 10*9/L Final    Absolute Lymphocytes 08/18/2022 0.8 (L)  1.1 - 3.6 10*9/L Final    Absolute Monocytes 08/18/2022 0.3  0.3 - 0.8 10*9/L Final    Absolute Eosinophils 08/18/2022 0.0  0.0 - 0.5 10*9/L Final    Absolute Basophils 08/18/2022 0.0  0.0 - 0.1 10*9/L Final    Macrocytosis 08/18/2022 Moderate (A)  Not Present Final    Anisocytosis 08/18/2022 Marked (A)  Not Present Final

## 2022-08-23 ENCOUNTER — Ambulatory Visit: Admit: 2022-08-23 | Discharge: 2022-08-23 | Payer: PRIVATE HEALTH INSURANCE

## 2022-08-23 ENCOUNTER — Ambulatory Visit
Admit: 2022-08-23 | Discharge: 2022-08-23 | Payer: PRIVATE HEALTH INSURANCE | Attending: Adult Health | Primary: Adult Health

## 2022-08-23 ENCOUNTER — Other Ambulatory Visit: Admit: 2022-08-23 | Discharge: 2022-08-23 | Payer: PRIVATE HEALTH INSURANCE

## 2022-08-23 DIAGNOSIS — C9100 Acute lymphoblastic leukemia not having achieved remission: Principal | ICD-10-CM

## 2022-08-23 LAB — COMPREHENSIVE METABOLIC PANEL
ALBUMIN: 3.9 g/dL (ref 3.4–5.0)
ALKALINE PHOSPHATASE: 51 U/L (ref 46–116)
ALT (SGPT): 55 U/L — ABNORMAL HIGH (ref 10–49)
ANION GAP: 6 mmol/L (ref 5–14)
AST (SGOT): 44 U/L — ABNORMAL HIGH (ref <=34)
BILIRUBIN TOTAL: 0.6 mg/dL (ref 0.3–1.2)
BLOOD UREA NITROGEN: 14 mg/dL (ref 9–23)
BUN / CREAT RATIO: 19
CALCIUM: 8.9 mg/dL (ref 8.7–10.4)
CHLORIDE: 110 mmol/L — ABNORMAL HIGH (ref 98–107)
CO2: 26 mmol/L (ref 20.0–31.0)
CREATININE: 0.73 mg/dL
EGFR CKD-EPI (2021) FEMALE: >90 mL/min/{1.73_m2} (ref >=60)
GLUCOSE RANDOM: 97 mg/dL (ref 70–179)
POTASSIUM: 4 mmol/L (ref 3.4–4.8)
PROTEIN TOTAL: 6.2 g/dL (ref 5.7–8.2)
SODIUM: 142 mmol/L (ref 135–145)

## 2022-08-23 LAB — CMV DNA, QUANTITATIVE, PCR: CMV VIRAL LD: NOT DETECTED

## 2022-08-23 LAB — CBC W/ AUTO DIFF
BASOPHILS ABSOLUTE COUNT: 0 10*9/L (ref 0.0–0.1)
BASOPHILS RELATIVE PERCENT: 0.5 %
EOSINOPHILS ABSOLUTE COUNT: 0 10*9/L (ref 0.0–0.5)
EOSINOPHILS RELATIVE PERCENT: 2.4 %
HEMATOCRIT: 27 % — ABNORMAL LOW (ref 34.0–44.0)
HEMOGLOBIN: 9.6 g/dL — ABNORMAL LOW (ref 11.3–14.9)
LYMPHOCYTES ABSOLUTE COUNT: 1.2 10*9/L (ref 1.1–3.6)
LYMPHOCYTES RELATIVE PERCENT: 57.7 %
MEAN CORPUSCULAR HEMOGLOBIN CONC: 35.6 g/dL (ref 32.0–36.0)
MEAN CORPUSCULAR HEMOGLOBIN: 40.8 pg — ABNORMAL HIGH (ref 25.9–32.4)
MEAN CORPUSCULAR VOLUME: 114.6 fL — ABNORMAL HIGH (ref 77.6–95.7)
MEAN PLATELET VOLUME: 7.4 fL (ref 6.8–10.7)
MONOCYTES ABSOLUTE COUNT: 0.3 10*9/L (ref 0.3–0.8)
MONOCYTES RELATIVE PERCENT: 15.8 %
NEUTROPHILS ABSOLUTE COUNT: 0.5 10*9/L — ABNORMAL LOW (ref 1.8–7.8)
NEUTROPHILS RELATIVE PERCENT: 23.6 %
PLATELET COUNT: 122 10*9/L — ABNORMAL LOW (ref 150–450)
RED BLOOD CELL COUNT: 2.36 10*12/L — ABNORMAL LOW (ref 3.95–5.13)
RED CELL DISTRIBUTION WIDTH: 21 % — ABNORMAL HIGH (ref 12.2–15.2)
WBC ADJUSTED: 2 10*9/L — ABNORMAL LOW (ref 3.6–11.2)

## 2022-08-23 LAB — LACTATE DEHYDROGENASE: LACTATE DEHYDROGENASE: 508 U/L — ABNORMAL HIGH (ref 120–246)

## 2022-08-23 MED ADMIN — vinCRIStine (ONCOVIN) 2 mg in sodium chloride (NS) 0.9 % 25 mL IVPB: 2 mg | INTRAVENOUS | @ 18:00:00 | Stop: 2022-08-23

## 2022-08-23 MED ADMIN — dextrose 5 % infusion: 30 mL/h | INTRAVENOUS | @ 18:00:00

## 2022-08-23 MED ADMIN — heparin, porcine (PF) 100 unit/mL injection 500 Units: 500 [IU] | INTRAVENOUS | @ 20:00:00 | Stop: 2022-08-24

## 2022-08-23 MED ADMIN — acetaminophen (TYLENOL) tablet 650 mg: 650 mg | ORAL | @ 17:00:00 | Stop: 2022-08-23

## 2022-08-23 MED ADMIN — immun glob G(IgG)-pro-IgA 0-50 (PRIVIGEN) 10 % intravenous solution 25 g: .4 g/kg | INTRAVENOUS | @ 18:00:00 | Stop: 2022-08-23

## 2022-08-23 MED ADMIN — diphenhydrAMINE (BENADRYL) capsule/tablet 25 mg: 25 mg | ORAL | @ 17:00:00 | Stop: 2022-08-23

## 2022-08-23 MED ADMIN — sodium chloride (NS) 0.9 % infusion: 100 mL/h | INTRAVENOUS | @ 17:00:00

## 2022-08-23 NOTE — Unmapped (Signed)
Pt in unit for chemo infusion and IVIG per orders; pt resting in bed, call light in reach. Orders reviewed. Premeds given. Chemo given per orders with no complications noted. IVIG given per orders; rates and VS in epic per protocol. Pt left ambulatory.

## 2022-08-23 NOTE — Unmapped (Signed)
[facility] Follow Up Visit Note     [name]  Patient Age: 47 y.o.  Encounter Date: 08/23/2022    Primary Care Provider:  [name], MD    Referring Physician:  [name], MD  [address]  [place],  [place] [address]    Cancer Diagnosis: Ph+ B-ALL; Initial Dx 10/29/2021  Cancer status: CR1, BCR/ABL p190 negative in marrow  Treatment Regimen: First Line, Maintenance (Dasatinib, vincristine, prednisone) s/p truncated course of GRAAPH-2005, HyperCVAD  Treatment Goal: Curative  Comorbidities: HTN, anxiety, hemorrhoids with anal fissures  Transplant: referral deferred in CR1    Assessment/Plan:  Patricia [name] is a 47 y.o. female with past medical history of chronic right shoulder and back pain, hypertension, anxiety, and recently diagnosed Ph+ ALL. 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  Given her deep molecular remission and her intolerance of hyperCVAD, we are transitioned to maintenance chemotherapy.     She is now s/p 3 Cycle Maintenance dasatinib + vincristine + prednisone. BCR/ABL remains undetectable. ANC remains similarly low (0.5 today). Platelets are normal, hemoglobin remains in 9's. Her performance status continues to improve. The neutropenia may be due to the valcyte but could be due to the dasatinib. She has not fevered in a couple of months and, aside from one positive result, CMV has not been detectable ince 07/18/22. Will continue without changes for now and will continue to involve the pharmacy team and Dr. Kari Baars, Infectious Diseases. Once Dr. Kari Baars recommends, will taper Valcyte and see if ANC improves.    Continue monthly IVIG.    Ph+ALL, in remission: On GRAAPH 2005 + dasatinib. Completed 12/12 planned IT treatments for CNS ppx.  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   - Counseling given   - Consider ref to comprehensive cancer support program     Patient-centered care/Shared decision-making:   We discussed the plan above at length. The patient and her husband actively contributed to the conversation. Specifically, her most important outcomes are:   Prolonging overall survival and Maintaining her overall functional activity     Supportive Care Needs: We recommend based on the patient???s underlying diagnosis and treatment history the following supportive care:    1. Antimicrobial prophylaxis:    Viral - HOLD valacyclovir while on Valcyte  Bacterial: Levofloxacin when ANC <  0.5  Fungal: Fluconazole 200mg  when ANC <0.5  PJP: dapsone 100mg  daily    2. Blood product support:  Leukoreduced blood products are required.  Irradiated blood products are preferred, but in case of urgent transfusion needs non-irradiated blood products may be used: 2 units for Hg <=8.0.     Coordination of care:   - Continue maintenance   - weekly lab checks      [name], AGPCNP-BC  Nurse Practitioner  Hematology/Oncology  [facility]    [name], MD was available  Leukemia Program  Division of Hematology  [facility]      Nurse Navigator (non-clinical trial patients): Elicia Lamp, RN        Tel. [phone]       Fax. [phone]  Toll-free appointments: [phone]  Scheduling assistance: [phone]  After hours/weekends: [phone] (ask for adult hematology/oncology on-call)      History of Present Illness:   Patricia [name] is a 47 y.o. female with past medical history noted as above who presents for follow up Ph+ ALL.      Her social history is notable for being a Runner, broadcasting/film/video.  She was recently married. She lives with her husband.    Interim history  She reports feeling well.   No fevers.  Getting more active  Walking 2,0000 steps/day  HR has been less tachy cardic  Some foot pain - maybe from being more up and active  Toe discomfort left toes - will get some custom inserts  Tomorrow - new chiropractor visit- will use acupuncture  Worried about covid vaccine so she will not receive it for now.    Oncology History is as below:   Hematology/Oncology History Overview Note   Referring/Local Oncologist:    Diagnosis:   10/29/2021  Bone marrow, left iliac, aspiration and biopsy  -  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 The majority of the abnormal cells (64/88) contained an additional BCR/ABL1 fusion signal, while 9/88 abnormal cells contained an additional ABL1 and ASS1 signal.       Genetics:   Karyotype/FISH:   RESULTS   Date Value Ref Range Status   10/28/2021   Final    NOTE: This report reflects a combined study from a peripheral blood and a bone marrow core biopsy. Eleven cells from the peripheral blood and nine cells from the bone marrow core biopsy were analyzed. The BCR/ABL1 FISH analysis was performed on the peripheral blood.     Abnormal Karyotype:  47,XX,t(9;22)(q34;q11.2),+der(22)t(9;22)[18]/46,XX[2]    Abnormal FISH:  A BCR/ABL1 interphase FISH assay showed a signal pattern consistent with a BCR::ABL1 rearrangement and the 9;22 translocation in 88% of the 100 cells scored. The majority of the abnormal cells (64/88) contained an additional BCR/ABL1 fusion signal, while 9/88 abnormal cells contained an additional ABL1 and ASS1 signal.           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  MRD by flow insufficient, p190 2/100,000  12/10/2021: Cycle 2 B cycle + rituximab  12/14/21: IT#4  12/17/21: IT#5  01/10/22: Cycle 3 A cycle + rituximab  01/11/22: IT #6  01/24/22: Bmbx - MRD-neg by PCR (p190)   02/17/22: C4 B cycle + ritux  02/18/22: IT#7  03/21/22: C5 A cycle + ritux (4th dose)  03/22/22: IT#8  04/29/22: Bmbx - MRD-neg by PCR (p190)   05/18/22: restart dasatinib 70mg  s/p neutropenia  05/26/22: Start maintenance vincristine 2g, Prednisone 200mg  D1-4   06/02/22: IT #9  06/27/22: C2 Maintenance: vincristine 2g, Prednisone 200mg  D1-4, HOLD dasatinib due to neutropenia  06/27/22: IT #10  07/04/22: RESTART dasatinib 70mg   07/21/22: IT #11  07/26/22: C3 Maintenance: vincristine 2g, Prednisone 200mg  D1-4, dasatinib 70 mg  08/08/22: IT #12 - prophylaxis completed  08/23/22: C4 Maintenance: vincristine 2g, Prednisone 200mg  D1-4, dasatinib 70 mg         Review of Systems:   ROS reviewed and negative except as noted in H and P     Allergies:  Allergies   Allergen Reactions    Erythromycin Hives     Other reaction(s): Not available    Other      Pt cant take ibuprofen due to condition.    Sulfa (Sulfonamide Antibiotics) Anaphylaxis     Other reaction(s): Not available    Azithromycin        Medications:     Current Outpatient Medications:     acetaminophen (TYLENOL 8 HOUR) 650 MG CR tablet, Take 2 tablets (1,300 mg total) by mouth every eight (8) hours as needed for pain., Disp: , Rfl:     albuterol HFA 90 mcg/actuation inhaler, Inhale 2 puffs every six (6) hours as needed for wheezing., Disp: , Rfl:     cetirizine (ZYRTEC) 10 MG tablet, Take 1 tablet (10 mg total) by mouth nightly., Disp: 30 tablet, Rfl: 2    dapsone 100 MG tablet, TAKE 1 TABLET(100 MG) BY MOUTH DAILY, Disp: 30 tablet, Rfl: 11    dasatinib (SPRYCEL) 70 MG tablet, Take 1 tablet (70 mg total) by mouth daily., Disp: 30 tablet, Rfl: 7    escitalopram oxalate (LEXAPRO) 10 MG tablet, Take 1 tablet (10 mg total) by mouth daily., Disp: 90 tablet, Rfl: 0    fluconazole (DIFLUCAN) 200 MG tablet, Take 1 tablet (200 mg total) by mouth daily., Disp: 30 tablet, Rfl: 2    levalbuterol (XOPENEX) 0.31 mg/3 mL nebulizer solution, Inhale 3 mL (0.31 mg total) by nebulization every four (4) hours as needed for wheezing (cough)., Disp: 45 mL, Rfl: 0    levoFLOXacin (LEVAQUIN) 500 MG tablet, Take 1 tablet (500 mg total) by mouth daily., Disp: 30 tablet, Rfl: 2    norethindrone (MICRONOR) 0.35 mg tablet, Take 1 tablet by mouth daily., Disp: 84 tablet, Rfl: 3    predniSONE (DELTASONE) 50 MG tablet, Take 4 tablets (200 mg total) by mouth once daily on days 1-5 of each cycle., Disp: 20 tablet, Rfl: 5    riboflavin, vitamin B2, 400 mg Tab, Take 400 mg by mouth in the morning. (Patient not taking: Reported on 08/23/2022), Disp: 30 tablet, Rfl: 6    tizanidine (ZANAFLEX) 4 MG tablet, Take 1 tablet (4 mg total) by mouth nightly., Disp: 90 tablet, Rfl: 2    triamcinolone (KENALOG) 0.1 % cream, Apply topically two (2) times a day for 14 days., Disp: 45 g, Rfl: 0  valGANciclovir (VALCYTE) 450 mg tablet, Take 1 tablet (450 mg total) by mouth two (2) times a day., Disp: 60 tablet, Rfl: 2  No current facility-administered medications for this visit.    Facility-Administered Medications Ordered in Other Visits:     dexAMETHasone (DECADRON) 4 mg/mL injection 20 mg, 20 mg, Intravenous, Once PRN, [name], MD    dextrose 5 % infusion, 30 mL/hr, Intravenous, Continuous, Lenon Ahmadi, AGNP, Stopped at 08/23/22 1519    dextrose 5 % infusion, 60 mL/hr, Intravenous, Continuous PRN, Lenon Ahmadi, AGNP    diphenhydrAMINE (BENADRYL) injection 25 mg, 25 mg, Intravenous, Once PRN, [name], MD    EPINEPHrine [facility]) injection 0.3 mg, 0.3 mg, Intramuscular, Once PRN, [name], MD    famotidine (PF) (PEPCID) injection 20 mg, 20 mg, Intravenous, Once PRN, [name], MD    heparin, porcine (PF) 100 unit/mL injection 500 Units, 500 Units, Intravenous, Q30 Min PRN, [name], MD, 500 Units at 08/23/22 1519    methylPREDNISolone sodium succinate (PF) (SOLU-Medrol) injection 125 mg, 125 mg, Intravenous, Once PRN, [name], MD    OKAY TO SEND MEDICATION/CHEMOTHERAPY TO OUTPATIENT UNIT, , Other, Once, [name], MD    prochlorperazine (COMPAZINE) injection 10 mg, 10 mg, Intravenous, Q6H PRN, [name], MD    prochlorperazine (COMPAZINE) tablet 10 mg, 10 mg, Oral, Q6H PRN, [name], MD    sodium chloride (NS) 0.9 % infusion, 20 mL/hr, Intravenous, Continuous PRN, [name], MD    sodium chloride (NS) 0.9 % infusion, 100 mL/hr, Intravenous, Continuous, [name], MD, Stopped at 08/23/22 1321    sodium chloride 0.9% (NS) bolus 1,000 mL, 1,000 mL, Intravenous, Once PRN, [name], MD    Medical History:  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    EBV infection        Social History:  Social History     Social History Narrative    Not on file       Family History:  No family history on file.    Objective:   There were no vitals taken for this visit.    Physical Exam:  GENERAL: well-appearing  woman   HEART: Normal color, not excessive pallor.  CHEST/LUNG: Normal work of breathing. No dyspnea with conversation. No crackles. Regular rate/rhythm  ABDOMEN: No obvious distention.    EXTREMITIES: No edema, cyanosis or clubbing.   SKIN: no rashes noted  NEURO EXAM: Grossly intact.       Test Results:  08/23/2022 CBC/d and CMP reviewed with patient

## 2022-08-23 NOTE — Unmapped (Addendum)
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 Since last cycle, she has been feeling well. Hasn't had much issue with low-grade fevers recently. She is staying active with more exercise. No nausea/vomiting, constipation, no new pain; no mouth sores; energy better, appetite good. Does note some numbness and tingling in her toes. She has struggled with steroid crash each time and does feel like it is getting worse. Overall she can push through since she is not working but difficult to tolerate the steroids. Taking dasatininb without missed doses. No SOB, dizziness, bleeding. arted treatment dose Valcyte to treat her CMV which has been thought to be causing her cycle of fevers and low counts. She fevered right after starting Valcyte, but then has not had a fever since. She did get a recent dose of lupron last month which we've previously discussed the risks/benefit of and she has been taking micronor outside of just yesterday where she felt like it might have made her puffy so stopped it but she will restart it. She is currently not sexually active.     Labs: WBC 2.0, ANC 0.5, Hgb 9.6, PLT 122, CMP notable for grade 1 elevations in LFTs (stable, improved with AST 44, ALT 55); CMV viral load undetectable; Total lgG 237 from 11/27; absolute CD4 count 276  BCR-ABL p190 pending from today; last negative on 07/26/22    Oncologic History:  Hematology/Oncology History Overview Note   Referring/Local Oncologist:    Diagnosis:   10/29/2021  Bone marrow, left iliac, aspiration and biopsy  -  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 The majority of the abnormal cells (64/88) contained an additional BCR/ABL1 fusion signal, while 9/88 abnormal cells contained an additional ABL1 and ASS1 signal.       Genetics:   Karyotype/FISH:   RESULTS   Date Value Ref Range Status   10/28/2021   Final    NOTE: This report reflects a combined study from a peripheral blood and a bone marrow core biopsy. Eleven cells from the peripheral blood and nine cells from the bone marrow core biopsy were analyzed. The BCR/ABL1 FISH analysis was performed on the peripheral blood.     Abnormal Karyotype:  47,XX,t(9;22)(q34;q11.2),+der(22)t(9;22)[18]/46,XX[2]    Abnormal FISH:  A BCR/ABL1 interphase FISH assay showed a signal pattern consistent with a BCR::ABL1 rearrangement and the 9;22 translocation in 88% of the 100 cells scored. The majority of the abnormal cells (64/88) contained an additional BCR/ABL1 fusion signal, while 9/88 abnormal cells contained an additional ABL1 and ASS1 signal.           Pertinent Phenotypic data:  CD19 98%  CD20 on diagnosis 51%  CD22 98%      Treatment Timeline:  10/29/2021: Bone marrow biopsy: Ph+ ALL, 51% expression of CD20  10/30/21: Cycle 1 day 1 GRAAPH-2005 induction  11/03/21: ITT #1  11/12/2021: IT #2  11/18/2021: IT #3  11/29/2021: Post cycle 1 bone marrow biopsy: Morphologic CR.  MRD by flow insufficient, p190 2/100,000  12/10/2021: Cycle 2 B cycle + rituximab  12/14/21: IT#4  12/17/21: IT#5  01/10/22: Cycle 3 A cycle + rituximab  01/11/22: IT #6  01/24/22: Bmbx - MRD-neg by PCR (p190)   02/17/22: C4 B cycle + ritux  02/18/22: IT#7  03/21/22: C5 A cycle + ritux (4th dose)  03/22/22: IT#8  04/29/22: Bmbx - MRD-neg by PCR (p190)   05/18/22: restart dasatinib 70mg  s/p neutropenia  05/26/22: Start maintenance vincristine 2g, Prednisone 200mg  D1-4   06/02/22: IT #9  06/27/22: C2 Maintenance: vincristine 2g, Prednisone 200mg  D1-4, HOLD dasatinib due to neutropenia  06/27/22: IT #10  07/04/22: RESTART dasatinib 70mg   07/21/22: IT #11  07/26/22: C3 Maintenance: vincristine 2g, Prednisone 200mg  D1-4, dasatinib 70 mg     B-cell acute lymphoblastic leukemia (ALL) (CMS-HCC)   10/29/2021 Initial Diagnosis    B-cell acute lymphoblastic leukemia (ALL) (CMS-HCC)     10/30/2021 - 03/30/2022 Chemotherapy    IP/OP LEUKEMIA GRAAPH-2005 < 60 YO (OP PEGFILGRASTIM ON DAY 7)  [No description for this plan]     01/06/2022 - 04/07/2022 Endocrine/Hormone Therapy    OP LEUPROLIDE (LUPRON) 11.25 MG EVERY 3 MONTHS  Plan Provider: [name], MD     05/26/2022 -  Chemotherapy    OP LEUKEMIA VINCRISTINE  vinCRIStine 2 mg IV on day 1     07/26/2022 Endocrine/Hormone Therapy    OP LEUPROLIDE (LUPRON) 11.25 MG EVERY 3 MONTHS  Plan Provider: [name], MD         Weight and Vitals:  Wt Readings from Last 3 Encounters:   08/23/22 85.5 kg (188 lb 7.9 oz)   08/08/22 83.1 kg (183 lb 4.8 oz)   07/26/22 83.6 kg (184 lb 4.9 oz)     Temp Readings from Last 3 Encounters:   08/23/22 36.9 ??C (98.5 ??F) (Temporal)   08/08/22 37.1 ??C (98.8 ??F) (Oral)   07/28/22 37.1 ??C (98.8 ??F) (Temporal)     BP Readings from Last 3 Encounters:   08/23/22 105/79   08/08/22 125/79   07/28/22 104/60     Pulse Readings from Last 3 Encounters:   08/23/22 88   08/08/22 107   07/28/22 78       Pertinent Labs:  Lab on 08/23/2022   Component Date Value Ref Range Status    LDH 08/23/2022 508 (H)  120 - 246 U/L Final    Sodium 08/23/2022 142  135 - 145 mmol/L Final    Potassium 08/23/2022 4.0  3.4 - 4.8 mmol/L Final    Chloride 08/23/2022 110 (H)  98 - 107 mmol/L Final    CO2 08/23/2022 26.0  20.0 - 31.0 mmol/L Final    Anion Gap 08/23/2022 6  5 - 14 mmol/L Final    BUN 08/23/2022 14  9 - 23 mg/dL Final    Creatinine 60/45/4098 0.73  0.55 - 1.02 mg/dL Final    BUN/Creatinine Ratio 08/23/2022 19   Final    eGFR CKD-EPI (2021) Female 08/23/2022 >90  >=60 mL/min/1.35m2 Final    eGFR calculated with CKD-EPI 2021 equation in accordance with [employer] and [employer] of Nephrology Task Force recommendations.    Glucose 08/23/2022 97  70 - 179 mg/dL Final    Calcium 11/91/4782 8.9  8.7 - 10.4 mg/dL Final    Albumin 95/62/1308 3.9  3.4 - 5.0 g/dL Final  Total Protein 08/23/2022 6.2  5.7 - 8.2 g/dL Final    Total Bilirubin 08/23/2022 0.6  0.3 - 1.2 mg/dL Final    AST 16/05/9603 44 (H)  <=34 U/L Final    ALT 08/23/2022 55 (H)  10 - 49 U/L Final    Alkaline Phosphatase 08/23/2022 51  46 - 116 U/L Final    WBC 08/23/2022 2.0 (L)  3.6 - 11.2 10*9/L Final    RBC 08/23/2022 2.36 (L)  3.95 - 5.13 10*12/L Final    HGB 08/23/2022 9.6 (L)  11.3 - 14.9 g/dL Final    HCT 54/04/8118 27.0 (L)  34.0 - 44.0 % Final    MCV 08/23/2022 114.6 (H)  77.6 - 95.7 fL Final    MCH 08/23/2022 40.8 (H)  25.9 - 32.4 pg Final    MCHC 08/23/2022 35.6  32.0 - 36.0 g/dL Final    RDW 14/78/2956 21.0 (H)  12.2 - 15.2 % Final    MPV 08/23/2022 7.4  6.8 - 10.7 fL Final    Platelet 08/23/2022 122 (L)  150 - 450 10*9/L Final    Neutrophils % 08/23/2022 23.6  % Final    Lymphocytes % 08/23/2022 57.7  % Final    Monocytes % 08/23/2022 15.8  % Final    Eosinophils % 08/23/2022 2.4  % Final    Basophils % 08/23/2022 0.5  % Final    Absolute Neutrophils 08/23/2022 0.5 (L)  1.8 - 7.8 10*9/L Final    Absolute Lymphocytes 08/23/2022 1.2  1.1 - 3.6 10*9/L Final    Absolute Monocytes 08/23/2022 0.3  0.3 - 0.8 10*9/L Final    Absolute Eosinophils 08/23/2022 0.0  0.0 - 0.5 10*9/L Final    Absolute Basophils 08/23/2022 0.0  0.0 - 0.1 10*9/L Final    Macrocytosis 08/23/2022 Moderate (A)  Not Present Final    Anisocytosis 08/23/2022 Moderate (A)  Not Present Final       Allergies:   Allergies   Allergen Reactions Erythromycin Hives     Other reaction(s): Not available    Other      Pt cant take ibuprofen due to condition.    Sulfa (Sulfonamide Antibiotics) Anaphylaxis     Other reaction(s): Not available    Azithromycin        Drug Interactions: none identified; dasatinib is a major 3A4 substrate, caution with 3A4 inh/ind; fluconazole max dose with VCR is 200 mg daily; avoid acid reducers with  dasatinib.       Current Medications:  Current Outpatient Medications   Medication Sig Dispense Refill    acetaminophen (TYLENOL 8 HOUR) 650 MG CR tablet Take 2 tablets (1,300 mg total) by mouth every eight (8) hours as needed for pain.      albuterol HFA 90 mcg/actuation inhaler Inhale 2 puffs every six (6) hours as needed for wheezing.      cetirizine (ZYRTEC) 10 MG tablet Take 1 tablet (10 mg total) by mouth nightly. 30 tablet 2    dapsone 100 MG tablet TAKE 1 TABLET(100 MG) BY MOUTH DAILY 30 tablet 11    dasatinib (SPRYCEL) 70 MG tablet Take 1 tablet (70 mg total) by mouth daily. 30 tablet 7    escitalopram oxalate (LEXAPRO) 10 MG tablet Take 1 tablet (10 mg total) by mouth daily. 90 tablet 0    fluconazole (DIFLUCAN) 200 MG tablet Take 1 tablet (200 mg total) by mouth daily. 30 tablet 2    levalbuterol (XOPENEX) 0.31 mg/3 mL nebulizer solution Inhale 3 mL (  0.31 mg total) by nebulization every four (4) hours as needed for wheezing (cough). 45 mL 0    levoFLOXacin (LEVAQUIN) 500 MG tablet Take 1 tablet (500 mg total) by mouth daily. 30 tablet 2    tizanidine (ZANAFLEX) 4 MG tablet Take 1 tablet (4 mg total) by mouth nightly. 90 tablet 2    triamcinolone (KENALOG) 0.1 % cream Apply topically two (2) times a day for 14 days. 45 g 0    valGANciclovir (VALCYTE) 450 mg tablet Take 1 tablet (450 mg total) by mouth two (2) times a day. 60 tablet 2    norethindrone (MICRONOR) 0.35 mg tablet Take 1 tablet by mouth daily. (Patient not taking: Reported on 08/23/2022) 84 tablet 3    predniSONE (DELTASONE) 50 MG tablet Take 4 tablets (200 mg total) by mouth once daily on days 1-5 of each cycle. (Patient not taking: Reported on 08/23/2022) 20 tablet 5    riboflavin, vitamin B2, 400 mg Tab Take 400 mg by mouth in the morning. (Patient not taking: Reported on 08/23/2022) 30 tablet 6     No current facility-administered medications for this visit.       Adherence: No barriers identified/no missed doses.       Assessment: Ms.Ebert is a 47 y.o. 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 Patient finished 5 cycles of hyperCVAD + rituximab + dasatinib before transitioning to VCR/dasatinib/prednisone maintenance. She is currently on dasatinib 70 mg daily, previously unable to titrate to normal maintenance dosing due to neutropenia (likely CMV and now valcyte related). Doing well, counts continue to fluctuate with ANC 0.5 today, overall down-trending. CMV now undetectable and discussed with ICID team that we can stop valcyte since recent CMV undetectable, that way we can assess cytopenias off of valcyte. Continue on micronor for protective benefit and to see if she tolerates for possible contraception option after lupron. She is no longer at risk for significant TCP therefore not needing ovarian suppression. Following with gynecology. Continue weekly labs for counts and CMV. 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 Does have significant crash from steroids, can consider dose reduction if no longer manageable.     Plan:     Ph+ ALL  - Continue vincristine IV on day 1 of 28 day cycle (due today)  - Continue prednisone 200 mg (4 tablets) days 1-5 of 28 day cycle (will restart today)  - Continue dasatinib 70 mg daily (consider increasing to 100 mg daily once ANC stabilizes)  - # 11 of 12 IT therapy complete, will plan final IT in the next 1-2 mo  - Continue weekly labs for both CBC/diff (assess need for transfusions) and for CMV levels, will follow w/ reminders   - BCR-ABL pending, has been negative   - RTC in 4 weeks for C4    Infection   -Continue dapsone 100 mg PO daily for PJP ppx  -STOP Valcyte. Discussed with ICID regarding threshold to stop and per Dr. Kari Baars, we can stop given recently undetectable CMV and resume if needed.   -Restart valacyclovir 500 mg PO daily.   -Restart levofloxacin/fluconazole 200 mg since ANC is 0.5; stop if ANC>0.5  -Continue IVIG - dose due today and continue every 4 weeks     Supportive Care  - Continue Tylenol prn for pain. Check temperature before Tylenol dose and avoid taking NSAIDs.  - Can take Dulcolax prn for constipation  - Can take famotidine if having acid reflex from steroids. Space famotidine  2 hours after dasatinib dose.   - Most recent dose of Lupron on 07/26/22. Discussed this can be final dose, than consider alternative contraception. Will continue micronor as bone/CV protection and can also trial it for continued use for contraception and menses suppression once off lupron.  - Following with gynecology    F/u:  Future Appointments   Date Time Provider Department Center   08/23/2022 10:00 AM [address] [facility]   08/23/2022 10:45 AM ONCINF CHAIR 42 [id number]   09/20/2022  8:00 AM ADULT ONC LAB [facility]   09/20/2022  9:00 AM Lenon Ahmadi, AGNP [id number]   09/20/2022 10:00 AM ONCHEM LEUKEMIA PHARMACIST [id number]   10/04/2022 12:00 PM Ortencia Kick, MD [facility]   11/04/2022  1:45 PM Idelia Salm, MD [facility]       I spent 25 minutes in direct patient care    Ronnald Collum, PharmD, BCOP, CPP  Hematology/Oncology Clinical Pharmacist  Pager [phone]

## 2022-08-23 NOTE — Unmapped (Addendum)
-  Vincristine today in clinic  -IVIG today in clinic  -Restart prednisone 200 mg (4 tablets) by mouth for 5 days  -Restart levofloxacin 1 tablet daily and fluconazole 1 tablet (200 mg daily)  -Continue dasatinib 70 mg (1 tablet) once daily  -Can discuss Micronor with gynecology  -Continue weekly labs in [place]

## 2022-08-23 NOTE — Unmapped (Signed)
You are doing well!    ANC is 0.5 today.    Proceed with Vincristine, IVIG and start the prednisone.  Continue dasatinib.  We will discuss the valcyte with Dr. Kari Baars but no changes for now.

## 2022-08-24 MED ORDER — VALACYCLOVIR 500 MG TABLET
ORAL_TABLET | Freq: Every day | ORAL | 3 refills | 90 days | Status: CP
Start: 2022-08-24 — End: ?

## 2022-08-29 ENCOUNTER — Institutional Professional Consult (permissible substitution): Admit: 2022-08-29 | Discharge: 2022-08-30 | Payer: PRIVATE HEALTH INSURANCE

## 2022-08-29 DIAGNOSIS — B259 Cytomegaloviral disease, unspecified: Principal | ICD-10-CM

## 2022-08-29 DIAGNOSIS — C9100 Acute lymphoblastic leukemia not having achieved remission: Principal | ICD-10-CM

## 2022-08-29 LAB — CBC W/ AUTO DIFF
BASOPHILS ABSOLUTE COUNT: 0 10*9/L (ref 0.0–0.1)
BASOPHILS RELATIVE PERCENT: 0.4 %
EOSINOPHILS ABSOLUTE COUNT: 0 10*9/L (ref 0.0–0.5)
EOSINOPHILS RELATIVE PERCENT: 0.1 %
HEMATOCRIT: 25.4 % — ABNORMAL LOW (ref 34.0–44.0)
HEMOGLOBIN: 8.9 g/dL — ABNORMAL LOW (ref 11.3–14.9)
LYMPHOCYTES ABSOLUTE COUNT: 0.9 10*9/L — ABNORMAL LOW (ref 1.1–3.6)
LYMPHOCYTES RELATIVE PERCENT: 28.6 %
MEAN CORPUSCULAR HEMOGLOBIN CONC: 34.9 g/dL (ref 32.0–36.0)
MEAN CORPUSCULAR HEMOGLOBIN: 40.1 pg — ABNORMAL HIGH (ref 25.9–32.4)
MEAN CORPUSCULAR VOLUME: 114.9 fL — ABNORMAL HIGH (ref 77.6–95.7)
MEAN PLATELET VOLUME: 8.2 fL (ref 6.8–10.7)
MONOCYTES ABSOLUTE COUNT: 0.7 10*9/L (ref 0.3–0.8)
MONOCYTES RELATIVE PERCENT: 23.6 %
NEUTROPHILS ABSOLUTE COUNT: 1.5 10*9/L — ABNORMAL LOW (ref 1.8–7.8)
NEUTROPHILS RELATIVE PERCENT: 47.3 %
NUCLEATED RED BLOOD CELLS: 0 /100{WBCs} (ref <=4)
PLATELET COUNT: 125 10*9/L — ABNORMAL LOW (ref 150–450)
RED BLOOD CELL COUNT: 2.21 10*12/L — ABNORMAL LOW (ref 3.95–5.13)
RED CELL DISTRIBUTION WIDTH: 19.2 % — ABNORMAL HIGH (ref 12.2–15.2)
WBC ADJUSTED: 3.1 10*9/L — ABNORMAL LOW (ref 3.6–11.2)

## 2022-08-29 LAB — COMPREHENSIVE METABOLIC PANEL
ALBUMIN: 3.9 g/dL (ref 3.4–5.0)
ALKALINE PHOSPHATASE: 48 U/L (ref 46–116)
ALT (SGPT): 81 U/L — ABNORMAL HIGH (ref 10–49)
ANION GAP: 7 mmol/L (ref 5–14)
AST (SGOT): 54 U/L — ABNORMAL HIGH (ref <=34)
BILIRUBIN TOTAL: 0.6 mg/dL (ref 0.3–1.2)
BLOOD UREA NITROGEN: 17 mg/dL (ref 9–23)
BUN / CREAT RATIO: 22
CALCIUM: 9.1 mg/dL (ref 8.7–10.4)
CHLORIDE: 110 mmol/L — ABNORMAL HIGH (ref 98–107)
CO2: 27.9 mmol/L (ref 20.0–31.0)
CREATININE: 0.76 mg/dL
EGFR CKD-EPI (2021) FEMALE: >90 mL/min/{1.73_m2} (ref >=60)
GLUCOSE RANDOM: 141 mg/dL (ref 70–179)
POTASSIUM: 3.1 mmol/L — ABNORMAL LOW (ref 3.4–4.8)
PROTEIN TOTAL: 6.3 g/dL (ref 5.7–8.2)
SODIUM: 145 mmol/L (ref 135–145)

## 2022-08-29 LAB — SLIDE REVIEW

## 2022-08-29 MED ADMIN — heparin, porcine (PF) 100 unit/mL injection 500 Units: 500 [IU] | INTRAVENOUS | @ 18:00:00 | Stop: 2022-08-29

## 2022-08-29 NOTE — Unmapped (Signed)
Clinical Support on 08/29/2022   Component Date Value Ref Range Status    WBC 08/29/2022 3.1 (L)  3.6 - 11.2 10*9/L Final    RBC 08/29/2022 2.21 (L)  3.95 - 5.13 10*12/L Final    HGB 08/29/2022 8.9 (L)  11.3 - 14.9 g/dL Final    HCT 16/05/9603 25.4 (L)  34.0 - 44.0 % Final    MCV 08/29/2022 114.9 (H)  77.6 - 95.7 fL Final    MCH 08/29/2022 40.1 (H)  25.9 - 32.4 pg Final    MCHC 08/29/2022 34.9  32.0 - 36.0 g/dL Final    RDW 54/04/8118 19.2 (H)  12.2 - 15.2 % Final    MPV 08/29/2022 8.2  6.8 - 10.7 fL Final    Platelet 08/29/2022 125 (L)  150 - 450 10*9/L Final    nRBC 08/29/2022 0  <=4 /100 WBCs Final    Neutrophils % 08/29/2022 47.3  % Final    Lymphocytes % 08/29/2022 28.6  % Final    Monocytes % 08/29/2022 23.6  % Final    Eosinophils % 08/29/2022 0.1  % Final    Basophils % 08/29/2022 0.4  % Final    Absolute Neutrophils 08/29/2022 1.5 (L)  1.8 - 7.8 10*9/L Final    Absolute Lymphocytes 08/29/2022 0.9 (L)  1.1 - 3.6 10*9/L Final    Absolute Monocytes 08/29/2022 0.7  0.3 - 0.8 10*9/L Final    Absolute Eosinophils 08/29/2022 0.0  0.0 - 0.5 10*9/L Final    Absolute Basophils 08/29/2022 0.0  0.0 - 0.1 10*9/L Final    Macrocytosis 08/29/2022 Moderate (A)  Not Present Final    Anisocytosis 08/29/2022 Moderate (A)  Not Present Final

## 2022-08-30 LAB — CMV DNA, QUANTITATIVE, PCR: CMV VIRAL LD: NOT DETECTED

## 2022-09-02 ENCOUNTER — Ambulatory Visit: Admit: 2022-09-02 | Discharge: 2022-09-02 | Payer: PRIVATE HEALTH INSURANCE

## 2022-09-02 ENCOUNTER — Institutional Professional Consult (permissible substitution): Admit: 2022-09-02 | Discharge: 2022-09-02 | Payer: PRIVATE HEALTH INSURANCE

## 2022-09-02 DIAGNOSIS — C9100 Acute lymphoblastic leukemia not having achieved remission: Principal | ICD-10-CM

## 2022-09-02 LAB — CBC W/ AUTO DIFF
BASOPHILS ABSOLUTE COUNT: 0 10*9/L (ref 0.0–0.1)
BASOPHILS RELATIVE PERCENT: 0.6 %
EOSINOPHILS ABSOLUTE COUNT: 0 10*9/L (ref 0.0–0.5)
EOSINOPHILS RELATIVE PERCENT: 1.2 %
HEMATOCRIT: 28.3 % — ABNORMAL LOW (ref 34.0–44.0)
HEMOGLOBIN: 9.7 g/dL — ABNORMAL LOW (ref 11.3–14.9)
LYMPHOCYTES ABSOLUTE COUNT: 0.7 10*9/L — ABNORMAL LOW (ref 1.1–3.6)
LYMPHOCYTES RELATIVE PERCENT: 40.7 %
MEAN CORPUSCULAR HEMOGLOBIN CONC: 34.2 g/dL (ref 32.0–36.0)
MEAN CORPUSCULAR HEMOGLOBIN: 40.4 pg — ABNORMAL HIGH (ref 25.9–32.4)
MEAN CORPUSCULAR VOLUME: 118 fL — ABNORMAL HIGH (ref 77.6–95.7)
MEAN PLATELET VOLUME: 8.2 fL (ref 6.8–10.7)
MONOCYTES ABSOLUTE COUNT: 0.4 10*9/L (ref 0.3–0.8)
MONOCYTES RELATIVE PERCENT: 26 %
NEUTROPHILS ABSOLUTE COUNT: 0.5 10*9/L — ABNORMAL LOW (ref 1.8–7.8)
NEUTROPHILS RELATIVE PERCENT: 31.5 %
NUCLEATED RED BLOOD CELLS: 1 /100{WBCs} (ref <=4)
PLATELET COUNT: 83 10*9/L — ABNORMAL LOW (ref 150–450)
RED BLOOD CELL COUNT: 2.4 10*12/L — ABNORMAL LOW (ref 3.95–5.13)
RED CELL DISTRIBUTION WIDTH: 19.5 % — ABNORMAL HIGH (ref 12.2–15.2)
WBC ADJUSTED: 1.6 10*9/L — ABNORMAL LOW (ref 3.6–11.2)

## 2022-09-02 MED ADMIN — heparin, porcine (PF) 100 unit/mL injection 500 Units: 500 [IU] | INTRAVENOUS | @ 13:00:00 | Stop: 2022-09-02

## 2022-09-02 MED ADMIN — heparin, porcine (PF) 100 unit/mL injection 500 Units: 500 [IU] | INTRAVENOUS | @ 14:00:00 | Stop: 2022-09-02

## 2022-09-02 NOTE — Unmapped (Signed)
Brief Adult Oncology Infusion Clinic Note:    Patricia [name] with diagnosis of B-cell ALL presents today for labs (CBC) and transfusion. Hgb 9.7 today. She does not meet transfusion parameters. Of note potassium level on 08/29/22 was 3.1, she notes that she only had lab appt that day and  therefore K+ was not replaced. We offered obtaining chemistry labs today but she prefers to come back on 2/5 for her scheduled labs. Infusion appt added for 2/5 for possible electrolyte replacement.     Clinical Support on 09/02/2022   Component Date Value Ref Range Status    WBC 09/02/2022 1.6 (L)  3.6 - 11.2 10*9/L Final    RBC 09/02/2022 2.40 (L)  3.95 - 5.13 10*12/L Final    HGB 09/02/2022 9.7 (L)  11.3 - 14.9 g/dL Final    HCT 09/81/1914 28.3 (L)  34.0 - 44.0 % Final    MCV 09/02/2022 118.0 (H)  77.6 - 95.7 fL Final    MCH 09/02/2022 40.4 (H)  25.9 - 32.4 pg Final    MCHC 09/02/2022 34.2  32.0 - 36.0 g/dL Final    RDW 78/29/5621 19.5 (H)  12.2 - 15.2 % Final    MPV 09/02/2022 8.2  6.8 - 10.7 fL Final    Platelet 09/02/2022 83 (L)  150 - 450 10*9/L Final    nRBC 09/02/2022 1  <=4 /100 WBCs Final    Neutrophils % 09/02/2022 31.5  % Final    Lymphocytes % 09/02/2022 40.7  % Final    Monocytes % 09/02/2022 26.0  % Final    Eosinophils % 09/02/2022 1.2  % Final    Basophils % 09/02/2022 0.6  % Final    Absolute Neutrophils 09/02/2022 0.5 (L)  1.8 - 7.8 10*9/L Final    Absolute Lymphocytes 09/02/2022 0.7 (L)  1.1 - 3.6 10*9/L Final    Absolute Monocytes 09/02/2022 0.4  0.3 - 0.8 10*9/L Final    Absolute Eosinophils 09/02/2022 0.0  0.0 - 0.5 10*9/L Final    Absolute Basophils 09/02/2022 0.0  0.0 - 0.1 10*9/L Final    Macrocytosis 09/02/2022 Moderate (A)  Not Present Final    Anisocytosis 09/02/2022 Moderate (A)  Not Present Final       Kennon Portela, AGNP-BC  [facility]

## 2022-09-02 NOTE — Unmapped (Signed)
Patient had lab drawn in nurse visit where port was accessed. Her labs resulted outside of parameters to receive blood transfusion. She is advised that no transfusion is needed today; states she is somewhat tired but that this is not unusual for her. When labs were drawn on 08/29/2022, her potassium resulted at 3.1. A repeat potassium (CMP) was not ordered for today. No replacement administered; no oral potassium on home medications. Discussed with Larry Sierras., APP. The patient has repeat labs ordered for 09/05/2022; a CMP is being added to those orders and infusion appointment is scheduled in the event the patient needs replacement. Patient was involved in this plan and is in full agreement; no other questions or concerns.  Her port was flushed, brisk blood return noted, heparin locked and de-accessed. The patient's other labs are reviewed with her; she will continue to use precautions for her ANC today of 0.5.

## 2022-09-02 NOTE — Unmapped (Signed)
Clinical Support on 09/02/2022   Component Date Value Ref Range Status    WBC 09/02/2022 1.6 (L)  3.6 - 11.2 10*9/L Final    RBC 09/02/2022 2.40 (L)  3.95 - 5.13 10*12/L Final    HGB 09/02/2022 9.7 (L)  11.3 - 14.9 g/dL Final    HCT 09/81/1914 28.3 (L)  34.0 - 44.0 % Final    MCV 09/02/2022 118.0 (H)  77.6 - 95.7 fL Final    MCH 09/02/2022 40.4 (H)  25.9 - 32.4 pg Final    MCHC 09/02/2022 34.2  32.0 - 36.0 g/dL Final    RDW 78/29/5621 19.5 (H)  12.2 - 15.2 % Final    MPV 09/02/2022 8.2  6.8 - 10.7 fL Final    Platelet 09/02/2022 83 (L)  150 - 450 10*9/L Final    nRBC 09/02/2022 1  <=4 /100 WBCs Final    Neutrophils % 09/02/2022 31.5  % Final    Lymphocytes % 09/02/2022 40.7  % Final    Monocytes % 09/02/2022 26.0  % Final    Eosinophils % 09/02/2022 1.2  % Final    Basophils % 09/02/2022 0.6  % Final    Absolute Neutrophils 09/02/2022 0.5 (L)  1.8 - 7.8 10*9/L Final    Absolute Lymphocytes 09/02/2022 0.7 (L)  1.1 - 3.6 10*9/L Final    Absolute Monocytes 09/02/2022 0.4  0.3 - 0.8 10*9/L Final    Absolute Eosinophils 09/02/2022 0.0  0.0 - 0.5 10*9/L Final    Absolute Basophils 09/02/2022 0.0  0.0 - 0.1 10*9/L Final    Macrocytosis 09/02/2022 Moderate (A)  Not Present Final    Anisocytosis 09/02/2022 Moderate (A)  Not Present Final

## 2022-09-05 ENCOUNTER — Institutional Professional Consult (permissible substitution): Admit: 2022-09-05 | Discharge: 2022-09-05 | Payer: PRIVATE HEALTH INSURANCE

## 2022-09-05 ENCOUNTER — Ambulatory Visit: Admit: 2022-09-05 | Discharge: 2022-09-05 | Payer: PRIVATE HEALTH INSURANCE

## 2022-09-05 DIAGNOSIS — B259 Cytomegaloviral disease, unspecified: Principal | ICD-10-CM

## 2022-09-05 DIAGNOSIS — C9100 Acute lymphoblastic leukemia not having achieved remission: Principal | ICD-10-CM

## 2022-09-05 LAB — COMPREHENSIVE METABOLIC PANEL
ALBUMIN: 3.9 g/dL (ref 3.4–5.0)
ALKALINE PHOSPHATASE: 64 U/L (ref 46–116)
ALT (SGPT): 88 U/L — ABNORMAL HIGH (ref 10–49)
ANION GAP: 6 mmol/L (ref 5–14)
AST (SGOT): 68 U/L — ABNORMAL HIGH (ref <=34)
BILIRUBIN TOTAL: 0.7 mg/dL (ref 0.3–1.2)
BLOOD UREA NITROGEN: 19 mg/dL (ref 9–23)
BUN / CREAT RATIO: 24
CALCIUM: 8.9 mg/dL (ref 8.7–10.4)
CHLORIDE: 108 mmol/L — ABNORMAL HIGH (ref 98–107)
CO2: 27.1 mmol/L (ref 20.0–31.0)
CREATININE: 0.78 mg/dL
EGFR CKD-EPI (2021) FEMALE: >90 mL/min/{1.73_m2} (ref >=60)
GLUCOSE RANDOM: 90 mg/dL (ref 70–179)
POTASSIUM: 4.1 mmol/L (ref 3.4–4.8)
PROTEIN TOTAL: 6.8 g/dL (ref 5.7–8.2)
SODIUM: 141 mmol/L (ref 135–145)

## 2022-09-05 LAB — CBC W/ AUTO DIFF
BASOPHILS ABSOLUTE COUNT: 0 10*9/L (ref 0.0–0.1)
BASOPHILS RELATIVE PERCENT: 1.2 %
EOSINOPHILS ABSOLUTE COUNT: 0 10*9/L (ref 0.0–0.5)
EOSINOPHILS RELATIVE PERCENT: 0.9 %
HEMATOCRIT: 30 % — ABNORMAL LOW (ref 34.0–44.0)
HEMOGLOBIN: 10.2 g/dL — ABNORMAL LOW (ref 11.3–14.9)
LYMPHOCYTES ABSOLUTE COUNT: 1 10*9/L — ABNORMAL LOW (ref 1.1–3.6)
LYMPHOCYTES RELATIVE PERCENT: 38.4 %
MEAN CORPUSCULAR HEMOGLOBIN CONC: 34.1 g/dL (ref 32.0–36.0)
MEAN CORPUSCULAR HEMOGLOBIN: 40 pg — ABNORMAL HIGH (ref 25.9–32.4)
MEAN CORPUSCULAR VOLUME: 117.3 fL — ABNORMAL HIGH (ref 77.6–95.7)
MEAN PLATELET VOLUME: 8.6 fL (ref 6.8–10.7)
MONOCYTES ABSOLUTE COUNT: 0.7 10*9/L (ref 0.3–0.8)
MONOCYTES RELATIVE PERCENT: 25.3 %
NEUTROPHILS ABSOLUTE COUNT: 0.9 10*9/L — ABNORMAL LOW (ref 1.8–7.8)
NEUTROPHILS RELATIVE PERCENT: 34.2 %
NUCLEATED RED BLOOD CELLS: 0 /100{WBCs} (ref <=4)
PLATELET COUNT: 114 10*9/L — ABNORMAL LOW (ref 150–450)
RED BLOOD CELL COUNT: 2.55 10*12/L — ABNORMAL LOW (ref 3.95–5.13)
RED CELL DISTRIBUTION WIDTH: 18.1 % — ABNORMAL HIGH (ref 12.2–15.2)
WBC ADJUSTED: 2.6 10*9/L — ABNORMAL LOW (ref 3.6–11.2)

## 2022-09-05 LAB — MAGNESIUM: MAGNESIUM: 2.1 mg/dL (ref 1.6–2.6)

## 2022-09-05 MED ADMIN — heparin, porcine (PF) 100 unit/mL injection 500 Units: 500 [IU] | INTRAVENOUS | @ 18:00:00 | Stop: 2022-09-05

## 2022-09-05 NOTE — Unmapped (Signed)
Clinical Support on 09/05/2022   Component Date Value Ref Range Status    Sodium 09/05/2022 141  135 - 145 mmol/L Final    Potassium 09/05/2022 4.1  3.4 - 4.8 mmol/L Final    Chloride 09/05/2022 108 (H)  98 - 107 mmol/L Final    CO2 09/05/2022 27.1  20.0 - 31.0 mmol/L Final    Anion Gap 09/05/2022 6  5 - 14 mmol/L Final    BUN 09/05/2022 19  9 - 23 mg/dL Final    Creatinine 09/81/1914 0.78  0.55 - 1.02 mg/dL Final    BUN/Creatinine Ratio 09/05/2022 24   Final    eGFR CKD-EPI (2021) Female 09/05/2022 >90  >=60 mL/min/1.58m2 Final    eGFR calculated with CKD-EPI 2021 equation in accordance with [employer] and [employer] of Nephrology Task Force recommendations.    Glucose 09/05/2022 90  70 - 179 mg/dL Final    Calcium 78/29/5621 8.9  8.7 - 10.4 mg/dL Final    Albumin 30/86/5784 3.9  3.4 - 5.0 g/dL Final    Total Protein 09/05/2022 6.8  5.7 - 8.2 g/dL Final    Total Bilirubin 09/05/2022 0.7  0.3 - 1.2 mg/dL Final    AST 69/62/9528 68 (H)  <=34 U/L Final    ALT 09/05/2022 88 (H)  10 - 49 U/L Final    Alkaline Phosphatase 09/05/2022 64  46 - 116 U/L Final    Results Verified by Slide Scan 09/05/2022 Slide Reviewed   Final    WBC 09/05/2022 2.6 (L)  3.6 - 11.2 10*9/L Final    RBC 09/05/2022 2.55 (L)  3.95 - 5.13 10*12/L Final    HGB 09/05/2022 10.2 (L)  11.3 - 14.9 g/dL Final    HCT 41/32/4401 30.0 (L)  34.0 - 44.0 % Final    MCV 09/05/2022 117.3 (H)  77.6 - 95.7 fL Final    MCH 09/05/2022 40.0 (H)  25.9 - 32.4 pg Final    MCHC 09/05/2022 34.1  32.0 - 36.0 g/dL Final    RDW 02/72/5366 18.1 (H)  12.2 - 15.2 % Final    MPV 09/05/2022 8.6  6.8 - 10.7 fL Final    Platelet 09/05/2022 114 (L)  150 - 450 10*9/L Final    nRBC 09/05/2022 0  <=4 /100 WBCs Final    Neutrophils % 09/05/2022 34.2  % Final    Lymphocytes % 09/05/2022 38.4  % Final    Monocytes % 09/05/2022 25.3  % Final    Eosinophils % 09/05/2022 0.9  % Final    Basophils % 09/05/2022 1.2  % Final    Absolute Neutrophils 09/05/2022 0.9 (L)  1.8 - 7.8 10*9/L Final    Absolute Lymphocytes 09/05/2022 1.0 (L)  1.1 - 3.6 10*9/L Final    Absolute Monocytes 09/05/2022 0.7  0.3 - 0.8 10*9/L Final    Absolute Eosinophils 09/05/2022 0.0  0.0 - 0.5 10*9/L Final    Absolute Basophils 09/05/2022 0.0  0.0 - 0.1 10*9/L Final    Macrocytosis 09/05/2022 Moderate (A)  Not Present Final    Anisocytosis 09/05/2022 Slight (A)  Not Present Final    Magnesium 09/05/2022 2.1  1.6 - 2.6 mg/dL Final    Blood Type 44/09/4740 O POS   Final    Antibody Screen 09/05/2022 NEG   Final

## 2022-09-05 NOTE — Unmapped (Signed)
Patient arrived in the infusion clinic at 1345.  Weight and vitals were obtained. Port was accessed during clinic nurse visit. RN flushed with NS, brisk blood return noted. Dressing clean, dry, and intact.  Labs were drawn during clinic nurse visit. K 4.1, Mag 2.1. No electrolyte replacement needed today per therapy plan. Hgb 10.2. Port deaccessed and gauze/bandaid applied. Patient declined after visit summary then discharged home to self care.

## 2022-09-06 LAB — CMV DNA, QUANTITATIVE, PCR
CMV QUANT LOG(10): 1.77 {Log_IU}/mL — ABNORMAL HIGH (ref <0.00)
CMV QUANT: 59 [IU]/mL — ABNORMAL HIGH (ref <0)
CMV VIRAL LD: DETECTED — AB

## 2022-09-06 NOTE — Unmapped (Signed)
[facility] Refill Coordination Note    Specialty Medication(s) to be Shipped:   Hematology/Oncology: Sprycel 70 mg    Other medication(s) to be shipped: No additional medications requested for fill at this time     Patricia [name], DOB: July 16, 1976  Phone: [phone] (home)       All above HIPAA information was verified with patient.     Was a Nurse, learning disability used for this call? No    Completed refill call assessment today to schedule patient's medication shipment from the [facility] [phone]).  All relevant notes have been reviewed.     Specialty medication(s) and dose(s) confirmed: Regimen is correct and unchanged.   Changes to medications: Patricia [name] reports no changes at this time.  Changes to insurance: No  New side effects reported not previously addressed with a pharmacist or physician: None reported  Questions for the pharmacist: No    Confirmed patient received a Conservation officer, historic buildings and a Surveyor, mining with first shipment. The patient will receive a drug information handout for each medication shipped and additional FDA Medication Guides as required.       DISEASE/MEDICATION-SPECIFIC INFORMATION        N/A    SPECIALTY MEDICATION ADHERENCE     Medication Adherence    Patient reported X missed doses in the last month: 0  Specialty Medication: Sprycel 70 mg  Patient is on additional specialty medications: No  Informant: patient                  Confirmed plan for next specialty medication refill: delivery by pharmacy  Refills needed for supportive medications: not needed          Refill Coordination    Has the Patients' Contact Information Changed: No  Is the Shipping Address Different: No         Were doses missed due to medication being on hold? No    Sprycel 70 mg: 14 days of medicine on hand     REFERRAL TO PHARMACIST     Referral to the pharmacist: Not needed      [facility]     Shipping address confirmed in Epic.     Delivery Scheduled: Yes, Expected medication delivery date: 09/13/22.     Medication will be delivered via Next Day Courier to the prescription address in Epic WAM.    Kermit Balo, [facility]   [facility] Specialty Pharmacist

## 2022-09-07 DIAGNOSIS — C9100 Acute lymphoblastic leukemia not having achieved remission: Principal | ICD-10-CM

## 2022-09-07 DIAGNOSIS — B259 Cytomegaloviral disease, unspecified: Principal | ICD-10-CM

## 2022-09-12 ENCOUNTER — Institutional Professional Consult (permissible substitution): Admit: 2022-09-12 | Discharge: 2022-09-13 | Payer: PRIVATE HEALTH INSURANCE

## 2022-09-12 DIAGNOSIS — B259 Cytomegaloviral disease, unspecified: Principal | ICD-10-CM

## 2022-09-12 DIAGNOSIS — C9100 Acute lymphoblastic leukemia not having achieved remission: Principal | ICD-10-CM

## 2022-09-12 LAB — COMPREHENSIVE METABOLIC PANEL
ALBUMIN: 3.7 g/dL (ref 3.4–5.0)
ALKALINE PHOSPHATASE: 96 U/L (ref 46–116)
ALT (SGPT): 89 U/L — ABNORMAL HIGH (ref 10–49)
ANION GAP: 8 mmol/L (ref 5–14)
AST (SGOT): 82 U/L — ABNORMAL HIGH (ref <=34)
BILIRUBIN TOTAL: 0.7 mg/dL (ref 0.3–1.2)
BLOOD UREA NITROGEN: 15 mg/dL (ref 9–23)
BUN / CREAT RATIO: 24
CALCIUM: 9 mg/dL (ref 8.7–10.4)
CHLORIDE: 108 mmol/L — ABNORMAL HIGH (ref 98–107)
CO2: 26.6 mmol/L (ref 20.0–31.0)
CREATININE: 0.62 mg/dL
EGFR CKD-EPI (2021) FEMALE: >90 mL/min/{1.73_m2} (ref >=60)
GLUCOSE RANDOM: 187 mg/dL — ABNORMAL HIGH (ref 70–179)
POTASSIUM: 3.7 mmol/L (ref 3.4–4.8)
PROTEIN TOTAL: 6.5 g/dL (ref 5.7–8.2)
SODIUM: 143 mmol/L (ref 135–145)

## 2022-09-12 LAB — CBC W/ AUTO DIFF
BASOPHILS ABSOLUTE COUNT: 0 10*9/L (ref 0.0–0.1)
BASOPHILS RELATIVE PERCENT: 0.7 %
EOSINOPHILS ABSOLUTE COUNT: 0 10*9/L (ref 0.0–0.5)
EOSINOPHILS RELATIVE PERCENT: 1 %
HEMATOCRIT: 26.4 % — ABNORMAL LOW (ref 34.0–44.0)
HEMOGLOBIN: 9.1 g/dL — ABNORMAL LOW (ref 11.3–14.9)
LYMPHOCYTES ABSOLUTE COUNT: 0.5 10*9/L — ABNORMAL LOW (ref 1.1–3.6)
LYMPHOCYTES RELATIVE PERCENT: 28.2 %
MEAN CORPUSCULAR HEMOGLOBIN CONC: 34.5 g/dL (ref 32.0–36.0)
MEAN CORPUSCULAR HEMOGLOBIN: 40 pg — ABNORMAL HIGH (ref 25.9–32.4)
MEAN CORPUSCULAR VOLUME: 115.9 fL — ABNORMAL HIGH (ref 77.6–95.7)
MEAN PLATELET VOLUME: 8.7 fL (ref 6.8–10.7)
MONOCYTES ABSOLUTE COUNT: 0.3 10*9/L (ref 0.3–0.8)
MONOCYTES RELATIVE PERCENT: 19.8 %
NEUTROPHILS ABSOLUTE COUNT: 0.8 10*9/L — ABNORMAL LOW (ref 1.8–7.8)
NEUTROPHILS RELATIVE PERCENT: 50.3 %
NUCLEATED RED BLOOD CELLS: 0 /100{WBCs} (ref <=4)
PLATELET COUNT: 67 10*9/L — ABNORMAL LOW (ref 150–450)
RED BLOOD CELL COUNT: 2.28 10*12/L — ABNORMAL LOW (ref 3.95–5.13)
RED CELL DISTRIBUTION WIDTH: 15.4 % — ABNORMAL HIGH (ref 12.2–15.2)
WBC ADJUSTED: 1.7 10*9/L — ABNORMAL LOW (ref 3.6–11.2)

## 2022-09-12 MED ORDER — LETERMOVIR 480 MG TABLET
ORAL_TABLET | Freq: Every day | ORAL | 2 refills | 30 days | Status: CP
Start: 2022-09-12 — End: ?
  Filled 2022-09-16: qty 28, 28d supply, fill #0

## 2022-09-12 MED ADMIN — heparin, porcine (PF) 100 unit/mL injection 500 Units: 500 [IU] | INTRAVENOUS | @ 19:00:00 | Stop: 2022-09-12

## 2022-09-12 MED FILL — SPRYCEL 70 MG TABLET: ORAL | 30 days supply | Qty: 30 | Fill #6

## 2022-09-12 NOTE — Unmapped (Signed)
Clinical Support on 09/12/2022   Component Date Value Ref Range Status    WBC 09/12/2022 1.7 (L)  3.6 - 11.2 10*9/L Final    RBC 09/12/2022 2.28 (L)  3.95 - 5.13 10*12/L Final    HGB 09/12/2022 9.1 (L)  11.3 - 14.9 g/dL Final    HCT 16/05/9603 26.4 (L)  34.0 - 44.0 % Final    MCV 09/12/2022 115.9 (H)  77.6 - 95.7 fL Final    MCH 09/12/2022 40.0 (H)  25.9 - 32.4 pg Final    MCHC 09/12/2022 34.5  32.0 - 36.0 g/dL Final    RDW 54/04/8118 15.4 (H)  12.2 - 15.2 % Final    MPV 09/12/2022 8.7  6.8 - 10.7 fL Final    Platelet 09/12/2022 67 (L)  150 - 450 10*9/L Final    nRBC 09/12/2022 0  <=4 /100 WBCs Final    Neutrophils % 09/12/2022 50.3  % Final    Lymphocytes % 09/12/2022 28.2  % Final    Monocytes % 09/12/2022 19.8  % Final    Eosinophils % 09/12/2022 1.0  % Final    Basophils % 09/12/2022 0.7  % Final    Absolute Neutrophils 09/12/2022 0.8 (L)  1.8 - 7.8 10*9/L Final    Absolute Lymphocytes 09/12/2022 0.5 (L)  1.1 - 3.6 10*9/L Final    Absolute Monocytes 09/12/2022 0.3  0.3 - 0.8 10*9/L Final    Absolute Eosinophils 09/12/2022 0.0  0.0 - 0.5 10*9/L Final    Absolute Basophils 09/12/2022 0.0  0.0 - 0.1 10*9/L Final    Macrocytosis 09/12/2022 Moderate (A)  Not Present Final

## 2022-09-12 NOTE — Unmapped (Signed)
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 I spoke with her today regarding plan for CMV after discussion with Dr. Kari Baars and ICID team. Recent CMV from 2/5 is detectable and she has also reported low-grade fevers in the last 1-2 weeks. She is currently on valtrex but off of valcyte. Discussed the plan below with Dr. Kari Baars and Dr. Senaida Ores.     Plan:  1) RESTART valganciclovir (Valcyte) 1 tablet (450 mg) PO BID  2) STOP valacyclovir   3) Will send letermovir 480 mg PO once daily to [facility] for benefits investigation as an option for CMV prophylaxis and avoid myelosuppression  4) Continue dasatinib 70 mg (and RTC for next maintenance cycle on 2/20)  5) Continue weekly labs with CMV monitoring    Ronnald Collum, PharmD, BCOP, CPP  Hematology/Oncology Clinical Pharmacist  Pager [phone]

## 2022-09-13 NOTE — Unmapped (Signed)
[facility] Specialty Medication Onboarding    Specialty Medication: PREVYMIS 480 mg tablet (letermovir)  Prior Authorization: Not Required   Financial Assistance: No - copay  <$25  Final Copay/Day Supply: $0 / 28    Insurance Restrictions: None     Notes to Pharmacist: Please contact team when setting up delivery.    The triage team has completed the benefits investigation and has determined that the patient is able to fill this medication at [facility]. Please contact the patient to complete the onboarding or follow up with the prescribing physician as needed.

## 2022-09-14 DIAGNOSIS — B259 Cytomegaloviral disease, unspecified: Principal | ICD-10-CM

## 2022-09-14 LAB — CMV DNA, QUANTITATIVE, PCR
CMV QUANT LOG(10): 2.61 {Log_IU}/mL — ABNORMAL HIGH (ref <0.00)
CMV QUANT: 408 [IU]/mL — ABNORMAL HIGH (ref <0)
CMV VIRAL LD: DETECTED — AB

## 2022-09-14 MED ORDER — VALGANCICLOVIR 450 MG TABLET
ORAL_TABLET | Freq: Two times a day (BID) | ORAL | 0 refills | 15 days | Status: CP
Start: 2022-09-14 — End: ?

## 2022-09-14 NOTE — Unmapped (Signed)
Valganciclovir dose increased to 900 mg BID per Dr. Kari Baars. Sent new Rx and updated patient.     Ronnald Collum, PharmD, BCOP, CPP  Hematology/Oncology Clinical Pharmacist  Pager [phone]

## 2022-09-14 NOTE — Unmapped (Signed)
[facility]   Patient Onboarding/Medication Counseling    Patricia [name] is a 47 y.o. 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  I am speaking to the patient.    Was a Nurse, learning disability used for this call? No    Verified patient's date of birth / HIPAA.    Specialty medication(s) to be sent: Infectious Disease: Prevymis      Non-specialty medications/supplies to be sent: n/a      Medications not needed at this time: n/a       [name])    Medication & Administration     Dosage: Take 1 tablet (448m) by mouth once daily    Administration:   Take with or without food  Swallow tablet whole    Adherence/Missed dose instructions:  Take a missed dose as soon as you think about it  If it is close to the time for your next dose, skip the missed dose and go back to scheduled time    Goals of Therapy     Prevent cytomegalovirus (CMV) infection in adult recipients of stem cell transplant    Side Effects & Monitoring Parameters     Common side effects  Diarrhea, nausea  Cough  Headache  Loss of strength or energy  Abdominal pain  Vomiting    The following side effects should be reported to the provider:  Allergic reaction  Swelling of arms or legs    Monitoring Parameters:  Monitor serum creatinine    Contraindications, Warnings, & Precautions     Not recommended in patients with severe hepatic impairment (Child-Pugh class C)  Use caution and closely monitor serum creatinine in patients with CrCl <50 ml/min    Drug/Food Interactions     Medication list reviewed in Epic. The patient was instructed to inform the care team before taking any new medications or supplements. CYP3A4 Inhibitors (Moderate) may increase the serum concentration of Dasatinib - monitor for edema, headache and rash.       Storage, Handling Precautions, & Disposal     Store at room temperature  Keep away from children and pets      Current Medications (including OTC/herbals), Comorbidities and Allergies     Current Outpatient Medications   Medication Sig Dispense Refill    acetaminophen (TYLENOL 8 HOUR) 650 MG CR tablet Take 2 tablets (1,300 mg total) by mouth every eight (8) hours as needed for pain.      albuterol HFA 90 mcg/actuation inhaler Inhale 2 puffs every six (6) hours as needed for wheezing.      cetirizine (ZYRTEC) 10 MG tablet Take 1 tablet (10 mg total) by mouth nightly. 30 tablet 2    dapsone 100 MG tablet TAKE 1 TABLET(100 MG) BY MOUTH DAILY 30 tablet 11    dasatinib (SPRYCEL) 70 MG tablet Take 1 tablet (70 mg total) by mouth daily. 30 tablet 7    escitalopram oxalate (LEXAPRO) 10 MG tablet Take 1 tablet (10 mg total) by mouth daily. 90 tablet 0    fluconazole (DIFLUCAN) 200 MG tablet Take 1 tablet (200 mg total) by mouth daily. 30 tablet 2    letermovir (PREVYMIS) 480 mg tablet Take 1 tablet (480 mg total) by mouth daily. 28 tablet 2    levalbuterol (XOPENEX) 0.31 mg/3 mL nebulizer solution Inhale 3 mL (0.31 mg total) by nebulization every four (4) hours as needed for wheezing (cough). 45 mL 0    levoFLOXacin (LEVAQUIN) 500 MG tablet  Take 1 tablet (500 mg total) by mouth daily. 30 tablet 2    norethindrone (MICRONOR) 0.35 mg tablet Take 1 tablet by mouth daily. 84 tablet 3    predniSONE (DELTASONE) 50 MG tablet Take 4 tablets (200 mg total) by mouth once daily on days 1-5 of each cycle. 20 tablet 5    riboflavin, vitamin B2, 400 mg Tab Take 400 mg by mouth in the morning. (Patient not taking: Reported on 08/23/2022) 30 tablet 6    tizanidine (ZANAFLEX) 4 MG tablet Take 1 tablet (4 mg total) by mouth nightly. 90 tablet 2    triamcinolone (KENALOG) 0.1 % cream Apply topically two (2) times a day for 14 days. 45 g 0     No current facility-administered medications for this visit.       Allergies   Allergen Reactions    Erythromycin Hives     Other reaction(s): Not available    Other      Pt cant take ibuprofen due to condition.    Sulfa (Sulfonamide Antibiotics) Anaphylaxis     Other reaction(s): Not available    Azithromycin        Patient Active Problem List   Diagnosis    Leukocytosis    B-cell acute lymphoblastic leukemia (ALL) (CMS-HCC)    Acute cough    Dyspepsia    Chronic frontal sinusitis    Pancytopenia (CMS-HCC)    Neutropenia with fever (CMS-HCC)    Blurred vision, bilateral    Anxiety    Hypogammaglobulinemia (CMS-HCC)    Hypokalemia    Acute tonsillitis    Allergic conjunctivitis    Chest pain with low risk for cardiac etiology    Chronic back pain    Chronic pansinusitis    Depression    Dog bite    Generalized anxiety disorder    Heart palpitations    Hydronephrosis, right    Hypertension    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    Appropriateness of Therapy     Acute infections noted within Epic:  No active infections  Patient reported infection: None    Is medication and dose appropriate based on diagnosis and infection status? Yes    Prescription has been clinically reviewed: Yes      Baseline Quality of Life Assessment      How many days over the past month did your condition  keep you from your normal activities? For example, brushing your teeth or getting up in the morning. 0    Financial Information     Medication Assistance provided: None Required    Anticipated copay of $0 reviewed with patient. Verified delivery address.    Delivery Information     Scheduled delivery date: 09/16/22    Expected start date: ~09/20/22    Patient was notified of new phone menu: Yes: new patient    Medication will be delivered via Clinic Courier - [facility]IP clinic to the prescription address in [place].  This shipment will not require a signature.      Explained the services we provide at [facility] and that each month we would call to set up refills.  Stressed importance of returning phone calls so that we could ensure they receive  their medications in time each month.  Informed patient that we should be setting up refills 7-10 days prior to when they will run out of medication.  A pharmacist will reach out to perform a clinical assessment periodically.  Informed patient that a welcome packet, containing information about our pharmacy and other support services, a Notice of Privacy Practices, and a drug information handout will be sent.      The patient or caregiver noted above participated in the development of this care plan and knows that they can request review of or adjustments to the care plan at any time.      Patient or caregiver verbalized understanding of the above information as well as how to contact the pharmacy at [phone] option 4 with any questions/concerns.  The pharmacy is open Monday through Friday 8:30am-4:30pm.  A pharmacist is available 24/7 via pager to answer any clinical questions they may have.    Patient Specific Needs     Does the patient have any physical, cognitive, or cultural barriers? No    Does the patient have adequate living arrangements? (i.e. the ability to store and take their medication appropriately) Yes    Did you identify any home environmental safety or security hazards? No    Patient prefers to have medications discussed with  Patient     Is the patient or caregiver able to read and understand education materials at a high school level or above? Yes    Patient's primary language is  English     Is the patient high risk? Yes, patient is taking oral chemotherapy. Appropriateness of therapy as been assessed    SOCIAL DETERMINANTS OF HEALTH     At the [facility], we have learned that life circumstances - like trouble affording food, housing, utilities, or transportation can affect the health of many of our patients.   That is why we wanted to ask: are you currently experiencing any life circumstances that are negatively impacting your health and/or quality of life? No    Social Determinants of Health     Financial Resource Strain: Low Risk  (11/02/2021)    Overall Financial Resource Strain (CARDIA)     Difficulty of Paying Living Expenses: Not hard at all   Internet Connectivity: Not on file   Food Insecurity: No Food Insecurity (11/02/2021)    Hunger Vital Sign     Worried About Running Out of Food in the Last Year: Never true     Ran Out of Food in the Last Year: Never true   Tobacco Use: Medium Risk (07/19/2022)    Patient History     Smoking Tobacco Use: Former     Smokeless Tobacco Use: Never     Passive Exposure: Not on file   Housing/Utilities: Low Risk  (11/02/2021)    Housing/Utilities     Within the past 12 months, have you ever stayed: outside, in a car, in a tent, in an overnight shelter, or temporarily in someone else's home (i.e. couch-surfing)?: No     Are you worried about losing your housing?: No     Within the past 12 months, have you been unable to get utilities (heat, electricity) when it was really needed?: No   Alcohol Use: Unknown (09/10/2019)    Received from [facility]    AUDIT-C     Frequency of Alcohol Consumption: Monthly or less     Average Number of Drinks: Not asked     Frequency  of Binge Drinking: Not asked   Transportation Needs: No Transportation Needs (11/02/2021)    PRAPARE - Therapist, art (Medical): No     Lack of Transportation (Non-Medical): No   Substance Use: Not on file   Health Literacy: Not on file   Physical Activity: Not on file   Interpersonal Safety: Not on file   Stress: Not on file   Intimate Partner Violence: Not on file   Depression: Not at risk (10/16/2019)    Received from [facility]    PHQ-2     SDOH PHQ2 SCORE: 0   Social Connections: Not on file       Would you be willing to receive help with any of the needs that you have identified today? Not applicable       Patricia [name], PharmD  [facility] Specialty Pharmacist

## 2022-09-16 NOTE — Unmapped (Signed)
Encounter addended by: Wylene Men, RPH on: 09/16/2022 1:13 PM   Actions taken: Specialty comments modified

## 2022-09-20 ENCOUNTER — Ambulatory Visit: Admit: 2022-09-20 | Discharge: 2022-09-20 | Payer: PRIVATE HEALTH INSURANCE

## 2022-09-20 ENCOUNTER — Other Ambulatory Visit: Admit: 2022-09-20 | Discharge: 2022-09-20 | Payer: PRIVATE HEALTH INSURANCE

## 2022-09-20 ENCOUNTER — Ambulatory Visit
Admit: 2022-09-20 | Discharge: 2022-09-20 | Payer: PRIVATE HEALTH INSURANCE | Attending: Adult Health | Primary: Adult Health

## 2022-09-20 DIAGNOSIS — C9100 Acute lymphoblastic leukemia not having achieved remission: Principal | ICD-10-CM

## 2022-09-20 DIAGNOSIS — B259 Cytomegaloviral disease, unspecified: Principal | ICD-10-CM

## 2022-09-20 LAB — LACTATE DEHYDROGENASE: LACTATE DEHYDROGENASE: 596 U/L — ABNORMAL HIGH (ref 120–246)

## 2022-09-20 LAB — COMPREHENSIVE METABOLIC PANEL
ALBUMIN: 3.5 g/dL (ref 3.4–5.0)
ALKALINE PHOSPHATASE: 91 U/L (ref 46–116)
ALT (SGPT): 76 U/L — ABNORMAL HIGH (ref 10–49)
ANION GAP: 4 mmol/L — ABNORMAL LOW (ref 5–14)
AST (SGOT): 59 U/L — ABNORMAL HIGH (ref <=34)
BILIRUBIN TOTAL: 0.5 mg/dL (ref 0.3–1.2)
BLOOD UREA NITROGEN: 13 mg/dL (ref 9–23)
BUN / CREAT RATIO: 20
CALCIUM: 8.9 mg/dL (ref 8.7–10.4)
CHLORIDE: 111 mmol/L — ABNORMAL HIGH (ref 98–107)
CO2: 28 mmol/L (ref 20.0–31.0)
CREATININE: 0.65 mg/dL
EGFR CKD-EPI (2021) FEMALE: >90 mL/min/{1.73_m2} (ref >=60)
GLUCOSE RANDOM: 116 mg/dL (ref 70–179)
POTASSIUM: 3.9 mmol/L (ref 3.4–4.8)
PROTEIN TOTAL: 6.3 g/dL (ref 5.7–8.2)
SODIUM: 143 mmol/L (ref 135–145)

## 2022-09-20 LAB — CBC W/ AUTO DIFF
BASOPHILS ABSOLUTE COUNT: 0 10*9/L (ref 0.0–0.1)
BASOPHILS RELATIVE PERCENT: 0.5 %
EOSINOPHILS ABSOLUTE COUNT: 0.1 10*9/L (ref 0.0–0.5)
EOSINOPHILS RELATIVE PERCENT: 5.4 %
HEMATOCRIT: 25.4 % — ABNORMAL LOW (ref 34.0–44.0)
HEMOGLOBIN: 8.9 g/dL — ABNORMAL LOW (ref 11.3–14.9)
LYMPHOCYTES ABSOLUTE COUNT: 1.1 10*9/L (ref 1.1–3.6)
LYMPHOCYTES RELATIVE PERCENT: 75.2 %
MEAN CORPUSCULAR HEMOGLOBIN CONC: 34.9 g/dL (ref 32.0–36.0)
MEAN CORPUSCULAR HEMOGLOBIN: 40.3 pg — ABNORMAL HIGH (ref 25.9–32.4)
MEAN CORPUSCULAR VOLUME: 115.6 fL — ABNORMAL HIGH (ref 77.6–95.7)
MEAN PLATELET VOLUME: 8 fL (ref 6.8–10.7)
MONOCYTES ABSOLUTE COUNT: 0.2 10*9/L — ABNORMAL LOW (ref 0.3–0.8)
MONOCYTES RELATIVE PERCENT: 12.6 %
NEUTROPHILS ABSOLUTE COUNT: 0.1 10*9/L — CL (ref 1.8–7.8)
NEUTROPHILS RELATIVE PERCENT: 6.3 %
PLATELET COUNT: 147 10*9/L — ABNORMAL LOW (ref 150–450)
RED BLOOD CELL COUNT: 2.2 10*12/L — ABNORMAL LOW (ref 3.95–5.13)
RED CELL DISTRIBUTION WIDTH: 14.9 % (ref 12.2–15.2)
WBC ADJUSTED: 1.5 10*9/L — ABNORMAL LOW (ref 3.6–11.2)

## 2022-09-20 LAB — SLIDE REVIEW

## 2022-09-20 MED ADMIN — diphenhydrAMINE (BENADRYL) capsule/tablet 25 mg: 25 mg | ORAL | @ 17:00:00 | Stop: 2022-09-20

## 2022-09-20 MED ADMIN — heparin, porcine (PF) 100 unit/mL injection 500 Units: 500 [IU] | INTRAVENOUS | @ 20:00:00 | Stop: 2022-09-21

## 2022-09-20 MED ADMIN — sodium chloride (NS) 0.9 % infusion: 100 mL/h | INTRAVENOUS | @ 17:00:00

## 2022-09-20 MED ADMIN — acetaminophen (TYLENOL) tablet 650 mg: 650 mg | ORAL | @ 17:00:00 | Stop: 2022-09-20

## 2022-09-20 MED ADMIN — vinCRIStine (ONCOVIN) 2 mg in sodium chloride (NS) 0.9 % 25 mL IVPB: 2 mg | INTRAVENOUS | @ 17:00:00 | Stop: 2022-09-20

## 2022-09-20 MED ADMIN — immun glob G(IgG)-pro-IgA 0-50 (PRIVIGEN) 10 % intravenous solution 25 g: .4 g/kg | INTRAVENOUS | @ 18:00:00 | Stop: 2022-09-20

## 2022-09-20 NOTE — Unmapped (Signed)
Lab on 09/20/2022   Component Date Value Ref Range Status    LDH 09/20/2022 596 (H)  120 - 246 U/L Final    Sodium 09/20/2022 143  135 - 145 mmol/L Final    Potassium 09/20/2022 3.9  3.4 - 4.8 mmol/L Final    Chloride 09/20/2022 111 (H)  98 - 107 mmol/L Final    CO2 09/20/2022 28.0  20.0 - 31.0 mmol/L Final    Anion Gap 09/20/2022 4 (L)  5 - 14 mmol/L Final    BUN 09/20/2022 13  9 - 23 mg/dL Final    Creatinine 10/96/0454 0.65  0.55 - 1.02 mg/dL Final    BUN/Creatinine Ratio 09/20/2022 20   Final    eGFR CKD-EPI (2021) Female 09/20/2022 >90  >=60 mL/min/1.33m2 Final    eGFR calculated with CKD-EPI 2021 equation in accordance with [employer] and [employer] of Nephrology Task Force recommendations.    Glucose 09/20/2022 116  70 - 179 mg/dL Final    Calcium 09/81/1914 8.9  8.7 - 10.4 mg/dL Final    Albumin 78/29/5621 3.5  3.4 - 5.0 g/dL Final    Total Protein 09/20/2022 6.3  5.7 - 8.2 g/dL Final    Total Bilirubin 09/20/2022 0.5  0.3 - 1.2 mg/dL Final    AST 30/86/5784 59 (H)  <=34 U/L Final    ALT 09/20/2022 76 (H)  10 - 49 U/L Final    Alkaline Phosphatase 09/20/2022 91  46 - 116 U/L Final    WBC 09/20/2022 1.5 (L)  3.6 - 11.2 10*9/L Final    RBC 09/20/2022 2.20 (L)  3.95 - 5.13 10*12/L Final    HGB 09/20/2022 8.9 (L)  11.3 - 14.9 g/dL Final    HCT 69/62/9528 25.4 (L)  34.0 - 44.0 % Final    MCV 09/20/2022 115.6 (H)  77.6 - 95.7 fL Final    MCH 09/20/2022 40.3 (H)  25.9 - 32.4 pg Final    MCHC 09/20/2022 34.9  32.0 - 36.0 g/dL Final    RDW 41/32/4401 14.9  12.2 - 15.2 % Final    MPV 09/20/2022 8.0  6.8 - 10.7 fL Final    Platelet 09/20/2022 147 (L)  150 - 450 10*9/L Final    Neutrophils % 09/20/2022 6.3  % Final    Lymphocytes % 09/20/2022 75.2  % Final    Monocytes % 09/20/2022 12.6  % Final    Eosinophils % 09/20/2022 5.4  % Final    Basophils % 09/20/2022 0.5  % Final    Absolute Neutrophils 09/20/2022 0.1 (LL)  1.8 - 7.8 10*9/L Final    Absolute Lymphocytes 09/20/2022 1.1  1.1 - 3.6 10*9/L Final    Absolute Monocytes 09/20/2022 0.2 (L)  0.3 - 0.8 10*9/L Final    Absolute Eosinophils 09/20/2022 0.1  0.0 - 0.5 10*9/L Final    Absolute Basophils 09/20/2022 0.0  0.0 - 0.1 10*9/L Final    Macrocytosis 09/20/2022 Moderate (A)  Not Present Final    Smear Review Comments 09/20/2022 See Comment (A)  Undefined Final    slide reviewed.  Instrument ID   [id number]    Polychromasia 09/20/2022 Slight (A)  Not Present Final

## 2022-09-20 NOTE — Unmapped (Signed)
Pt arrived for scheduled infusion, VS stable, labs reviewed per protocol. No s/s of reaction or adverse effect throughout infusion, Pt was de-accessed and discharged.

## 2022-09-20 NOTE — Unmapped (Addendum)
[facility] Follow Up Visit Note     [name]  Patient Age: 47 y.o.  Encounter Date: 09/20/2022    Primary Care Provider:  [name], MD    Referring Physician:  [name], MD  [address]  [place],  [place] [address]    Cancer Diagnosis: Ph+ B-ALL; Initial Dx 10/29/2021  Cancer status: CR1, BCR/ABL p190 negative in marrow  Treatment Regimen: First Line, Maintenance (Dasatinib, vincristine, prednisone) s/p truncated course of GRAAPH-2005, HyperCVAD  Treatment Goal: Curative  Comorbidities: HTN, anxiety, hemorrhoids with anal fissures  Transplant: referral deferred in CR1    Assessment/Plan:  Patricia [name] is a 47 y.o. female with past medical history of chronic right shoulder and back pain, hypertension, anxiety, and recently diagnosed Ph+ ALL. 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  Given her deep molecular remission and her intolerance of hyperCVAD, we are transitioned to maintenance chemotherapy.     She is now s/p 4 Cycle Maintenance dasatinib + vincristine + prednisone. She remains well overall and the persistent CMV viremia and associated neutropenia are the primary concerns. Elevated temps persist but last fever was a week ago. BCR/ABL remains undetectable. ANC 0.1, This may be due to the valcyte and possibly due to rising CMV, so we will continue dasatinib. Proceed with vincristine and prednisone.  She is back on valgancyclovir to treat CMV, followed by Dr. Kari Baars  Continue monthly IVIG.    Ph+ALL, in remission: On GRAAPH 2005 + dasatinib. Completed 12/12 planned IT treatments for CNS ppx.  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 09/2022 restarted Valcyte with the goal of controlling CMV and limiting neutropenia  - weekly CMV levels  - valgancyclovir 900mg  BID  - plan to switch to letermovir once CMV is undetectable    Hypogammaglobulinemia:   - Monthly IVIG started 07/26/22    Hemorrhoids, Anal fissure: persistent but stable, per patient 09/20/2022  - supportive care at home    Pancytopenia due to chemotherapy  - ANC 0.1, platelets 147K 09/20/2022  - supportive care as detailed below when in nadir    Menses suppression and birth control: Lupron  - dose 07/26/22    Will need birth control while on chemo, including when on only TKI -     Psychosocial distress: She reports a moderate level of anxiety regarding the management of the above.   - Counseling given   - Consider ref to comprehensive cancer support program     Patient-centered care/Shared decision-making:   We discussed the plan above at length. The patient and her husband actively contributed to the conversation. Specifically, her most important outcomes are:   Prolonging overall survival and Maintaining her overall functional activity     Supportive Care Needs: We recommend based on the patient???s underlying diagnosis and treatment history the following supportive care:    1. Antimicrobial prophylaxis:  Viral - HOLD valacyclovir while on Valgancyclovir  Bacterial: Levofloxacin when ANC <0.5  Fungal: Fluconazole 200mg  when ANC <0.5  PJP: dapsone 100mg  daily    2. Blood product support:  Leukoreduced blood products are required.  Irradiated blood products are preferred, but in case of urgent transfusion needs non-irradiated blood products may be used: 2 units for Hg <=8.0.     Coordination of care:   - Continue maintenance   - weekly lab checks      [name], AGPCNP-BC  Nurse Practitioner  Hematology/Oncology  [facility]    [name], MD was available  Leukemia Program  Division of Hematology  [facility]      Nurse Navigator (non-clinical trial patients): Elicia Lamp, RN        Tel. [phone]       Fax. [phone]  Toll-free appointments: [phone]  Scheduling assistance: [phone]  After hours/weekends: [phone] (ask for adult hematology/oncology on-call)      History of Present Illness:   Patricia [name] is a 47 y.o. female with past medical history noted as above who presents for follow up Ph+ ALL.      Her social history is notable for being a Runner, broadcasting/film/video.  She was recently married. She lives with her husband.    Interim history  She reports feeling well.  She's switched from Valcyte to valgancyclovir.  Continued elevated temps but last fever was 8 days ago (100.6).  Slight tingling in fingers (thumb,index, middle) and toes. Better shoes has helped.  She's felt some mild dizziness when changing positions quickly (up/down).  She hoping to return to teaching soon.     Oncology History is as below:   Hematology/Oncology History Overview Note   Referring/Local Oncologist:    Diagnosis:   10/29/2021  Bone marrow, left iliac, aspiration and biopsy  -  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 The majority of the abnormal cells (64/88) contained an additional BCR/ABL1 fusion signal, while 9/88 abnormal cells contained an additional ABL1 and ASS1 signal.       Genetics:   Karyotype/FISH:   RESULTS   Date Value Ref Range Status   10/28/2021   Final    NOTE: This report reflects a combined study from a peripheral blood and a bone marrow core biopsy. Eleven cells from the peripheral blood and nine cells from the bone marrow core biopsy were analyzed. The BCR/ABL1 FISH analysis was performed on the peripheral blood.     Abnormal Karyotype:  47,XX,t(9;22)(q34;q11.2),+der(22)t(9;22)[18]/46,XX[2]    Abnormal FISH:  A BCR/ABL1 interphase FISH assay showed a signal pattern consistent with a BCR::ABL1 rearrangement and the 9;22 translocation in 88% of the 100 cells scored. The majority of the abnormal cells (64/88) contained an additional BCR/ABL1 fusion signal, while 9/88 abnormal cells contained an additional ABL1 and ASS1 signal.           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  MRD by flow insufficient, p190 2/100,000  12/10/2021: Cycle 2 B cycle + rituximab  12/14/21: IT#4  12/17/21: IT#5  01/10/22: Cycle 3 A cycle + rituximab  01/11/22: IT #6  01/24/22: Bmbx - MRD-neg by PCR (p190)   02/17/22: C4 B cycle + ritux  02/18/22: IT#7  03/21/22: C5 A cycle + ritux (4th dose)  03/22/22: IT#8  04/29/22: Bmbx - MRD-neg by PCR (p190)   05/18/22: restart dasatinib 70mg  s/p neutropenia  05/26/22: Start maintenance vincristine 2g, Prednisone 200mg  D1-5   06/02/22: IT #9  06/27/22: C2 Maintenance: vincristine 2g, Prednisone 200mg  D1-5, HOLD dasatinib due to neutropenia  07/04/22: RESTART dasatinib 70mg   07/21/22: IT #10  07/26/22: C3 Maintenance: vincristine 2g, Prednisone 200mg  D1-5, dasatinib 70 mg  08/08/22: IT #11 - prophylaxis completed  08/23/22: C4 Maintenance: vincristine 2g, Prednisone 200mg  D1-5, dasatinib 70 mg  09/20/22: C5 Maintenance: vincristine 2g, Prednisone 200mg  D1-5, dasatinib 70 mg         Review of Systems:   ROS reviewed and negative except as noted in H and P Allergies:  Allergies   Allergen Reactions    Erythromycin Hives     Other reaction(s): Not available    Other      Pt cant take ibuprofen due to condition.    Sulfa (Sulfonamide Antibiotics) Anaphylaxis     Other reaction(s): Not available    Azithromycin        Medications:     Current Outpatient Medications:     acetaminophen (TYLENOL 8 HOUR) 650 MG CR tablet, Take 2 tablets (1,300 mg total) by mouth every eight (8) hours as needed for pain., Disp: , Rfl:     albuterol HFA 90 mcg/actuation inhaler, Inhale 2 puffs every six (6) hours as needed for wheezing., Disp: , Rfl:     cetirizine (ZYRTEC) 10 MG tablet, Take 1 tablet (10 mg total) by mouth nightly., Disp: 30 tablet, Rfl: 2    dapsone 100 MG tablet, TAKE 1 TABLET(100 MG) BY MOUTH DAILY, Disp: 30 tablet, Rfl: 11    dasatinib (SPRYCEL) 70 MG tablet, Take 1 tablet (70 mg total) by mouth daily., Disp: 30 tablet, Rfl: 7    escitalopram oxalate (LEXAPRO) 10 MG tablet, Take 1 tablet (10 mg total) by mouth daily., Disp: 90 tablet, Rfl: 0    fluconazole (DIFLUCAN) 200 MG tablet, Take 1 tablet (200 mg total) by mouth daily., Disp: 30 tablet, Rfl: 2    letermovir (PREVYMIS) 480 mg tablet, Take 1 tablet (480 mg total) by mouth daily., Disp: 28 tablet, Rfl: 2    levalbuterol (XOPENEX) 0.31 mg/3 mL nebulizer solution, Inhale 3 mL (0.31 mg total) by nebulization every four (4) hours as needed for wheezing (cough)., Disp: 45 mL, Rfl: 0    levoFLOXacin (LEVAQUIN) 500 MG tablet, Take 1 tablet (500 mg total) by mouth daily., Disp: 30 tablet, Rfl: 2    norethindrone (MICRONOR) 0.35 mg tablet, Take 1 tablet by mouth daily. (Patient not taking: Reported on 09/20/2022), Disp: 84 tablet, Rfl: 3    predniSONE (DELTASONE) 50 MG tablet, Take 4 tablets (200 mg total) by mouth once daily on days 1-5 of each cycle., Disp: 20 tablet, Rfl: 5    tizanidine (ZANAFLEX) 4 MG tablet, Take 1 tablet (4 mg total) by mouth nightly., Disp: 90 tablet, Rfl: 2    triamcinolone (KENALOG) 0.1 % cream, Apply topically two (2) times a day for 14 days., Disp: 45 g, Rfl:  0    valGANciclovir (VALCYTE) 450 mg tablet, Take 2 tablets (900 mg total) by mouth two (2) times a day., Disp: 60 tablet, Rfl: 0  No current facility-administered medications for this visit.    Facility-Administered Medications Ordered in Other Visits:     dexAMETHasone (DECADRON) 4 mg/mL injection 20 mg, 20 mg, Intravenous, Once PRN, [name], MD    dextrose 5 % infusion, 30 mL/hr, Intravenous, Continuous, Lenon Ahmadi, AGNP    dextrose 5 % infusion, 60 mL/hr, Intravenous, Continuous PRN, Lenon Ahmadi, AGNP    diphenhydrAMINE (BENADRYL) injection 25 mg, 25 mg, Intravenous, Once PRN, [name], MD    EPINEPHrine [facility]) injection 0.3 mg, 0.3 mg, Intramuscular, Once PRN, [name], MD    famotidine (PF) (PEPCID) injection 20 mg, 20 mg, Intravenous, Once PRN, [name], MD    immun glob G(IgG)-pro-IgA 0-50 (PRIVIGEN) 10 % intravenous solution 25 g, 0.4 g/kg (Ideal), Intravenous, Once, Dellis Anes, Ursula Alert, AGNP    methylPREDNISolone sodium succinate (PF) (SOLU-Medrol) injection 125 mg, 125 mg, Intravenous, Once PRN, [name], MD    OKAY TO SEND MEDICATION/CHEMOTHERAPY TO OUTPATIENT UNIT, , Other, Once, [name], MD    prochlorperazine (COMPAZINE) injection 10 mg, 10 mg, Intravenous, Q6H PRN, [name], MD    prochlorperazine (COMPAZINE) tablet 10 mg, 10 mg, Oral, Q6H PRN, [name], MD    sodium chloride (NS) 0.9 % infusion, 100 mL/hr, Intravenous, Continuous, [name], MD, Last Rate: 100 mL/hr at 09/20/22 1203, 100 mL/hr at 09/20/22 1203    sodium chloride (NS) 0.9 % infusion, 20 mL/hr, Intravenous, Continuous PRN, [name], MD    sodium chloride 0.9% (NS) bolus 1,000 mL, 1,000 mL, Intravenous, Once PRN, [name], MD    Medical History:  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    EBV infection        Social History:  Social History     Social History Narrative    Not on file       Family History:  No family history on file.    Objective:   BP 121/76  - Pulse 104  - Temp 36.5 ??C (97.7 ??F) (Temporal)  - Wt 84.8 kg (186 lb 15.2 oz)  - SpO2 96%  - BMI 30.19 kg/m??     Physical Exam:  GENERAL: well-appearing  woman   HEART: Normal color, not excessive pallor.  CHEST/LUNG: Normal work of breathing. No dyspnea with conversation. No crackles. Regular rate/rhythm  ABDOMEN: No obvious distention.    EXTREMITIES: No edema, cyanosis or clubbing.   SKIN: no rashes noted  NEURO EXAM: Grossly intact.       Test Results:  09/20/2022 CBC/d and CMP reviewed with patient

## 2022-09-20 NOTE — Unmapped (Signed)
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 Since last cycle, she has had recurrent low-grade fevers, typically ~99 or in that range. No nausea/vomiting, constipation, no new pain; no mouth sores; energy and appetite okay. Does note some numbness and tingling in her toes. She has struggled with steroid crash each time and does feel like it is getting worse but overall manageable enough to continue steroids. Continues to take dasatininb without missed doses. No SOB, dizziness, bleeding. Per our instructions, she has been taking treatment dose Valcyte to treat her CMV, and does feel like fevers are improved since start of Valcyte. We reviewed that we will plan to transition to letermovir when CMV is negative and undetectable. We reviewed dosing/administration of letermovir and she will keep it on hand until instructed to start. She did get a recent dose of lupron at end of Dec which we've previously discussed the risks/benefit of and she is going to resume micronor when lupron wearing off per her preference. She is currently not sexually active.     Labs: WBC 1.5, ANC 0.1, Hgb 8.9, PLT 147, CMP notable for grade 1 elevations in LFTs (stable, improved with AST 59, ALT 76); CMV viral load <35; Total lgG 237 from 11/27; absolute CD4 count 276  BCR-ABL p190 pending, last negative from 08/23/22    Oncologic History:  Hematology/Oncology History Overview Note   Referring/Local Oncologist:    Diagnosis:   10/29/2021  Bone marrow, left iliac, aspiration and biopsy  -  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 The majority of the abnormal cells (64/88) contained an additional BCR/ABL1 fusion signal, while 9/88 abnormal cells contained an additional ABL1 and ASS1 signal.       Genetics:   Karyotype/FISH:   RESULTS   Date Value Ref Range Status   10/28/2021   Final    NOTE: This report reflects a combined study from a peripheral blood and a bone marrow core biopsy. Eleven cells from the peripheral blood and nine cells from the bone marrow core biopsy were analyzed. The BCR/ABL1 FISH analysis was performed on the peripheral blood.     Abnormal Karyotype:  47,XX,t(9;22)(q34;q11.2),+der(22)t(9;22)[18]/46,XX[2]    Abnormal FISH:  A BCR/ABL1 interphase FISH assay showed a signal pattern consistent with a BCR::ABL1 rearrangement and the 9;22 translocation in 88% of the 100 cells scored. The majority of the abnormal cells (64/88) contained an additional BCR/ABL1 fusion signal, while 9/88 abnormal cells contained an additional ABL1 and ASS1 signal.           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  MRD by flow insufficient, p190 2/100,000  12/10/2021: Cycle 2 B cycle + rituximab  12/14/21: IT#4  12/17/21: IT#5  01/10/22: Cycle 3 A cycle + rituximab  01/11/22: IT #6  01/24/22: Bmbx - MRD-neg by PCR (p190)   02/17/22: C4 B cycle + ritux  02/18/22: IT#7  03/21/22: C5 A cycle + ritux (4th dose)  03/22/22: IT#8  04/29/22: Bmbx - MRD-neg by PCR (p190)   05/18/22: restart dasatinib 70mg  s/p neutropenia  05/26/22: Start maintenance vincristine 2g, Prednisone 200mg  D1-4   06/02/22: IT #9  06/27/22: C2 Maintenance: vincristine 2g, Prednisone 200mg  D1-4, HOLD dasatinib due to neutropenia  06/27/22: IT #10  07/04/22: RESTART dasatinib 70mg   07/21/22: IT #11  07/26/22: C3 Maintenance: vincristine 2g, Prednisone 200mg  D1-4, dasatinib 70 mg  08/08/22: IT #12 - prophylaxis completed  08/23/22: C4 Maintenance: vincristine 2g, Prednisone 200mg  D1-4, dasatinib 70 mg     B-cell acute lymphoblastic leukemia (ALL) (CMS-HCC)   10/29/2021 Initial Diagnosis    B-cell acute lymphoblastic leukemia (ALL) (CMS-HCC)     10/30/2021 - 03/30/2022 Chemotherapy    IP/OP LEUKEMIA GRAAPH-2005 < 60 YO (OP PEGFILGRASTIM ON DAY 7)  [No description for this plan]     01/06/2022 - 04/07/2022 Endocrine/Hormone Therapy    OP LEUPROLIDE (LUPRON) 11.25 MG EVERY 3 MONTHS  Plan Provider: [name], MD     05/26/2022 -  Chemotherapy    OP LEUKEMIA VINCRISTINE  vinCRIStine 2 mg IV on day 1     07/26/2022 Endocrine/Hormone Therapy    OP LEUPROLIDE (LUPRON) 11.25 MG EVERY 3 MONTHS  Plan Provider: [name], MD         Weight and Vitals:  Wt Readings from Last 3 Encounters:   09/05/22 83.6 kg (184 lb 3.1 oz)   09/02/22 85.6 kg (188 lb 11.4 oz)   08/23/22 85.5 kg (188 lb 7.9 oz)     Temp Readings from Last 3 Encounters:   09/05/22 37.2 ??C (99 ??F) (Temporal)   09/02/22 37 ??C (98.6 ??F) (Temporal)   08/23/22 36.8 ??C (98.2 ??F) (Oral)     BP Readings from Last 3 Encounters:   09/05/22 125/75   09/02/22 127/82   08/23/22 126/72     Pulse Readings from Last 3 Encounters:   09/05/22 101   09/02/22 113   08/23/22 74       Pertinent Labs:  No visits with results within 1 Day(s) from this visit.   Latest known visit with results is:   Clinical Support on 09/12/2022   Component Date Value Ref Range Status    CMV Viral Ld 09/12/2022 Detected (A)  Not Detected Final    CMV Quant 09/12/2022 408 (H)  <0 IU/mL Final    CMV Quant Log(10) 09/12/2022 2.61 (H)  <0.00 log IU/mL Final    Sodium 09/12/2022 143  135 - 145 mmol/L Final    Potassium 09/12/2022 3.7  3.4 - 4.8 mmol/L Final    Chloride 09/12/2022 108 (H)  98 - 107 mmol/L Final    CO2 09/12/2022 26.6  20.0 - 31.0 mmol/L Final    Anion Gap 09/12/2022 8  5 - 14 mmol/L Final    BUN 09/12/2022 15  9 - 23 mg/dL Final    Creatinine 54/04/8118 0.62  0.55 - 1.02 mg/dL Final    BUN/Creatinine Ratio 09/12/2022 24   Final    eGFR CKD-EPI (2021) Female 09/12/2022 >90  >=  60 mL/min/1.71m2 Final    eGFR calculated with CKD-EPI 2021 equation in accordance with [employer] and [employer] of Nephrology Task Force recommendations.    Glucose 09/12/2022 187 (H)  70 - 179 mg/dL Final    Calcium 54/04/8118 9.0  8.7 - 10.4 mg/dL Final    Albumin 14/78/2956 3.7  3.4 - 5.0 g/dL Final    Total Protein 09/12/2022 6.5  5.7 - 8.2 g/dL Final    Total Bilirubin 09/12/2022 0.7  0.3 - 1.2 mg/dL Final    AST 21/30/8657 82 (H)  <=34 U/L Final    ALT 09/12/2022 89 (H)  10 - 49 U/L Final    Alkaline Phosphatase 09/12/2022 96  46 - 116 U/L Final    WBC 09/12/2022 1.7 (L)  3.6 - 11.2 10*9/L Final    RBC 09/12/2022 2.28 (L)  3.95 - 5.13 10*12/L Final    HGB 09/12/2022 9.1 (L)  11.3 - 14.9 g/dL Final    HCT 84/69/6295 26.4 (L)  34.0 - 44.0 % Final    MCV 09/12/2022 115.9 (H)  77.6 - 95.7 fL Final    MCH 09/12/2022 40.0 (H)  25.9 - 32.4 pg Final    MCHC 09/12/2022 34.5  32.0 - 36.0 g/dL Final    RDW 28/41/3244 15.4 (H)  12.2 - 15.2 % Final    MPV 09/12/2022 8.7  6.8 - 10.7 fL Final    Platelet 09/12/2022 67 (L)  150 - 450 10*9/L Final    nRBC 09/12/2022 0  <=4 /100 WBCs Final    Neutrophils % 09/12/2022 50.3  % Final    Lymphocytes % 09/12/2022 28.2  % Final    Monocytes % 09/12/2022 19.8  % Final    Eosinophils % 09/12/2022 1.0  % Final    Basophils % 09/12/2022 0.7  % Final    Absolute Neutrophils 09/12/2022 0.8 (L)  1.8 - 7.8 10*9/L Final    Absolute Lymphocytes 09/12/2022 0.5 (L)  1.1 - 3.6 10*9/L Final    Absolute Monocytes 09/12/2022 0.3  0.3 - 0.8 10*9/L Final    Absolute Eosinophils 09/12/2022 0.0  0.0 - 0.5 10*9/L Final    Absolute Basophils 09/12/2022 0.0  0.0 - 0.1 10*9/L Final    Macrocytosis 09/12/2022 Moderate (A)  Not Present Final       Allergies:   Allergies   Allergen Reactions    Erythromycin Hives     Other reaction(s): Not available    Other      Pt cant take ibuprofen due to condition.    Sulfa (Sulfonamide Antibiotics) Anaphylaxis     Other reaction(s): Not available    Azithromycin        Drug Interactions: none identified; dasatinib is a major 3A4 substrate, caution with 3A4 inh/ind; fluconazole max dose with VCR is 200 mg daily; avoid acid reducers with  dasatinib.       Current Medications:  Current Outpatient Medications   Medication Sig Dispense Refill    acetaminophen (TYLENOL 8 HOUR) 650 MG CR tablet Take 2 tablets (1,300 mg total) by mouth every eight (8) hours as needed for pain.      albuterol HFA 90 mcg/actuation inhaler Inhale 2 puffs every six (6) hours as needed for wheezing.      cetirizine (ZYRTEC) 10 MG tablet Take 1 tablet (10 mg total) by mouth nightly. 30 tablet 2    dapsone 100 MG tablet TAKE 1 TABLET(100 MG) BY MOUTH DAILY 30 tablet 11    dasatinib (  SPRYCEL) 70 MG tablet Take 1 tablet (70 mg total) by mouth daily. 30 tablet 7    escitalopram oxalate (LEXAPRO) 10 MG tablet Take 1 tablet (10 mg total) by mouth daily. 90 tablet 0    fluconazole (DIFLUCAN) 200 MG tablet Take 1 tablet (200 mg total) by mouth daily. 30 tablet 2    letermovir (PREVYMIS) 480 mg tablet Take 1 tablet (480 mg total) by mouth daily. 28 tablet 2    levalbuterol (XOPENEX) 0.31 mg/3 mL nebulizer solution Inhale 3 mL (0.31 mg total) by nebulization every four (4) hours as needed for wheezing (cough). 45 mL 0    levoFLOXacin (LEVAQUIN) 500 MG tablet Take 1 tablet (500 mg total) by mouth daily. 30 tablet 2    norethindrone (MICRONOR) 0.35 mg tablet Take 1 tablet by mouth daily. 84 tablet 3    predniSONE (DELTASONE) 50 MG tablet Take 4 tablets (200 mg total) by mouth once daily on days 1-5 of each cycle. 20 tablet 5    riboflavin, vitamin B2, 400 mg Tab Take 400 mg by mouth in the morning. (Patient not taking: Reported on 08/23/2022) 30 tablet 6    tizanidine (ZANAFLEX) 4 MG tablet Take 1 tablet (4 mg total) by mouth nightly. 90 tablet 2    triamcinolone (KENALOG) 0.1 % cream Apply topically two (2) times a day for 14 days. 45 g 0    valGANciclovir (VALCYTE) 450 mg tablet Take 2 tablets (900 mg total) by mouth two (2) times a day. 60 tablet 0     No current facility-administered medications for this visit.       Adherence: No barriers identified/no missed doses.       Assessment: Ms.Handler is a 47 y.o. 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 Patient finished 5 cycles of hyperCVAD + rituximab + dasatinib before transitioning to VCR/dasatinib/prednisone maintenance. She is currently on dasatinib 70 mg daily, previously unable to titrate to normal maintenance dosing due to neutropenia (likely CMV and now valcyte related). Doing well, counts continue to fluctuate with notable drop in PLT that has now improved, but drop in ANC to 0.1. CMV has been detectable recently and per ICID, she has been on treatment dose valcyte with plans to transition to letermovir when CMV is no longer detectable (with the goal of CMV prophylaxis that will avoid any potential for myelosuppression). This way we we can better assess cytopenias off of valcyte. Continue on micronor for protective benefit and to see if she tolerates for possible contraception option after lupron. She is no longer at risk for significant TCP therefore not needing ovarian suppression. Following with gynecology. Continue weekly labs for counts and CMV. 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 Does have significant crash from steroids, can consider dose reduction if no longer manageable.     Plan:   Ph+ ALL  - Continue vincristine IV on day 1 of 28 day cycle (due today)  - Continue prednisone 200 mg (4 tablets) days 1-5 of 28 day cycle (will restart today)  - Continue dasatinib 70 mg daily (consider increasing to 100 mg daily if ANC allows)  - #11 of 12 IT therapy complete, final IT planned for 3/5  - Continue weekly labs for both CBC/diff (assess need for transfusions) and for CMV levels, will follow w/ reminders   - BCR-ABL has been negative, plan for marrow during this cycle  - RTC in 4 weeks for C6    Infection   -Continue  dapsone 100 mg PO daily for PJP ppx  -Continue valganciclovir 900 mg BID (treatment dose) until CMV is undetectable   -Plan to start letermovir 480 mg daily when CMV is undetectable (patient was given supply and will start only when instructed)  -Hold valacyclovir 500 mg PO daily while on valganciclovir (need to restart when valganciclovir is transitioned to letermovir)  -Restart levofloxacin/fluconazole 200 mg since ANC is 0.1; stop if ANC>0.5  -Continue IVIG - dose due today and continue every 4 weeks     Supportive Care  - Continue Tylenol prn for pain. Check temperature before Tylenol dose and avoid taking NSAIDs.  - Can take Dulcolax prn for constipation  - Can take famotidine if having acid reflex from steroids. Space famotidine 2 hours after dasatinib dose.   - Most recent dose of Lupron on 07/26/22. Discussed this can be final dose, than consider alternative contraception. Will continue micronor as bone/CV protection and can also trial it for continued use for contraception and menses suppression once off lupron.  - Following with gynecology    F/u:  Future Appointments   Date Time Provider Department Center   09/20/2022  8:00 AM ADULT ONC LAB [facility]   09/20/2022  9:00 AM Lenon Ahmadi, AGNP [id number]   09/20/2022 10:00 AM ONCHEM LEUKEMIA PHARMACIST [id number]   09/20/2022 11:00 AM ONCINF CHAIR 62 [facility]   09/26/2022  1:15 PM ONCINF HMOB LABS [facility]   10/04/2022 11:00 AM Ortencia Kick, MD [facility]   10/04/2022  2:30 PM ADULT ONC LAB [facility]   10/04/2022  3:30 PM Lenon Ahmadi, AGNP [facility]   10/05/2022 10:30 AM Bill Salinas, MD [facility]   11/04/2022  1:45 PM Idelia Salm, MD [facility]       I spent 30 minutes in direct patient care    Ronnald Collum, PharmD, BCOP, CPP  Hematology/Oncology Clinical Pharmacist  Pager [phone]

## 2022-09-20 NOTE — Unmapped (Addendum)
Vincristine and IVIG in clinic today  Prednisone 4 tablets (200 mg total) by mouth for 5 days total (take in the morning after food)  Continue dasatinib 70 mg (1 tablet) daily  Restart fluconazole 1 tablet (200 mg) daily  Restart levofloxacin 1 tablet (500 mg) daily  Continue valganciclovir 900 mg (2 tablets) twice a day   I will call you when we are ready to switch to letermovir for CMV prevention  Continue weekly labs at [facility]  We will work on scheduling lumbar puncture

## 2022-09-20 NOTE — Unmapped (Signed)
I am glad to see you are doing well!    You will proceed with the next cycle today. Remember to start the prednisone too.    We will continue to work with Dr. Kari Baars regarding the treatment of the CMV.    I will add a bone marrow biopsy on the day you return for your lumbar puncture.

## 2022-09-21 LAB — CMV DNA, QUANTITATIVE, PCR
CMV QUANT: <35 [IU]/mL — ABNORMAL HIGH (ref <0)
CMV VIRAL LD: DETECTED — AB

## 2022-09-26 ENCOUNTER — Institutional Professional Consult (permissible substitution): Admit: 2022-09-26 | Discharge: 2022-09-26 | Payer: PRIVATE HEALTH INSURANCE

## 2022-09-26 ENCOUNTER — Ambulatory Visit: Admit: 2022-09-26 | Discharge: 2022-09-26 | Payer: PRIVATE HEALTH INSURANCE

## 2022-09-26 DIAGNOSIS — C9100 Acute lymphoblastic leukemia not having achieved remission: Principal | ICD-10-CM

## 2022-09-26 DIAGNOSIS — B259 Cytomegaloviral disease, unspecified: Principal | ICD-10-CM

## 2022-09-26 LAB — CBC W/ AUTO DIFF
BASOPHILS ABSOLUTE COUNT: 0 10*9/L (ref 0.0–0.1)
BASOPHILS RELATIVE PERCENT: 0.1 %
EOSINOPHILS ABSOLUTE COUNT: 0.1 10*9/L (ref 0.0–0.5)
EOSINOPHILS RELATIVE PERCENT: 3.5 %
HEMATOCRIT: 27.6 % — ABNORMAL LOW (ref 34.0–44.0)
HEMOGLOBIN: 9.6 g/dL — ABNORMAL LOW (ref 11.3–14.9)
LYMPHOCYTES ABSOLUTE COUNT: 1.2 10*9/L (ref 1.1–3.6)
LYMPHOCYTES RELATIVE PERCENT: 68.8 %
MEAN CORPUSCULAR HEMOGLOBIN CONC: 34.9 g/dL (ref 32.0–36.0)
MEAN CORPUSCULAR HEMOGLOBIN: 40.2 pg — ABNORMAL HIGH (ref 25.9–32.4)
MEAN CORPUSCULAR VOLUME: 115.1 fL — ABNORMAL HIGH (ref 77.6–95.7)
MEAN PLATELET VOLUME: 7.4 fL (ref 6.8–10.7)
MONOCYTES ABSOLUTE COUNT: 0.3 10*9/L (ref 0.3–0.8)
MONOCYTES RELATIVE PERCENT: 15.8 %
NEUTROPHILS ABSOLUTE COUNT: 0.2 10*9/L — CL (ref 1.8–7.8)
NEUTROPHILS RELATIVE PERCENT: 11.8 %
NUCLEATED RED BLOOD CELLS: 1 /100{WBCs} (ref <=4)
PLATELET COUNT: 136 10*9/L — ABNORMAL LOW (ref 150–450)
RED BLOOD CELL COUNT: 2.4 10*12/L — ABNORMAL LOW (ref 3.95–5.13)
RED CELL DISTRIBUTION WIDTH: 14.4 % (ref 12.2–15.2)
WBC ADJUSTED: 1.7 10*9/L — ABNORMAL LOW (ref 3.6–11.2)

## 2022-09-26 LAB — CMV DNA, QUANTITATIVE, PCR
CMV QUANT: <35 [IU]/mL — ABNORMAL HIGH (ref <0)
CMV VIRAL LD: DETECTED — AB

## 2022-09-26 LAB — COMPREHENSIVE METABOLIC PANEL
ALBUMIN: 3.6 g/dL (ref 3.4–5.0)
ALKALINE PHOSPHATASE: 66 U/L (ref 46–116)
ALT (SGPT): 65 U/L — ABNORMAL HIGH (ref 10–49)
ANION GAP: 8 mmol/L (ref 5–14)
AST (SGOT): 46 U/L — ABNORMAL HIGH (ref <=34)
BILIRUBIN TOTAL: 0.5 mg/dL (ref 0.3–1.2)
BLOOD UREA NITROGEN: 21 mg/dL (ref 9–23)
BUN / CREAT RATIO: 27
CALCIUM: 8.4 mg/dL — ABNORMAL LOW (ref 8.7–10.4)
CHLORIDE: 110 mmol/L — ABNORMAL HIGH (ref 98–107)
CO2: 25.4 mmol/L (ref 20.0–31.0)
CREATININE: 0.79 mg/dL
EGFR CKD-EPI (2021) FEMALE: >90 mL/min/{1.73_m2} (ref >=60)
GLUCOSE RANDOM: 152 mg/dL (ref 70–179)
POTASSIUM: 3.4 mmol/L (ref 3.4–4.8)
PROTEIN TOTAL: 6.4 g/dL (ref 5.7–8.2)
SODIUM: 143 mmol/L (ref 135–145)

## 2022-09-26 LAB — LACTATE DEHYDROGENASE: LACTATE DEHYDROGENASE: 460 U/L — ABNORMAL HIGH (ref 120–246)

## 2022-09-26 MED ADMIN — heparin, porcine (PF) 100 unit/mL injection 500 Units: 500 [IU] | INTRAVENOUS | @ 14:00:00 | Stop: 2022-09-26

## 2022-09-26 NOTE — Unmapped (Signed)
Patient arrived to infusion at 908.  Patient had port accessed with brisk blood return in nurse visit and labs drawn.  Patients weight and vitals taken upon arrival.  Patients labs resulted and hemoglobin is 9.6 and platelets are 136,000.  Labs out of parameters for treatment plan.  Patients port flushed with brisk blood return and flushed with heparin and de-accessed.  Patient discharged without concerns.

## 2022-10-04 ENCOUNTER — Encounter: Admit: 2022-10-04 | Discharge: 2022-10-04 | Payer: PRIVATE HEALTH INSURANCE

## 2022-10-04 ENCOUNTER — Ambulatory Visit: Admit: 2022-10-04 | Discharge: 2022-10-04 | Payer: PRIVATE HEALTH INSURANCE

## 2022-10-04 ENCOUNTER — Ambulatory Visit
Admit: 2022-10-04 | Discharge: 2022-10-04 | Payer: PRIVATE HEALTH INSURANCE | Attending: Student in an Organized Health Care Education/Training Program | Primary: Student in an Organized Health Care Education/Training Program

## 2022-10-04 ENCOUNTER — Ambulatory Visit
Admit: 2022-10-04 | Discharge: 2022-10-04 | Payer: PRIVATE HEALTH INSURANCE | Attending: Adult Health | Primary: Adult Health

## 2022-10-04 ENCOUNTER — Other Ambulatory Visit: Admit: 2022-10-04 | Discharge: 2022-10-04 | Payer: PRIVATE HEALTH INSURANCE

## 2022-10-04 DIAGNOSIS — C9100 Acute lymphoblastic leukemia not having achieved remission: Principal | ICD-10-CM

## 2022-10-04 DIAGNOSIS — B259 Cytomegaloviral disease, unspecified: Principal | ICD-10-CM

## 2022-10-04 LAB — HEMATOPATHOLOGY LEUKEMIA/LYMPHOMA FLOW CYTOMETRY, CSF
LYMPHS CSF: 55.3 %
MONO/MACROPHAGE CSF: 44.7 %
NUCLEATED CELLS, CSF: 1 ul (ref 0–5)
NUMBER OF CELLS CSF: 38
RBC CSF: 120 ul — ABNORMAL HIGH

## 2022-10-04 LAB — CBC W/ AUTO DIFF
BASOPHILS ABSOLUTE COUNT: 0 10*9/L (ref 0.0–0.1)
BASOPHILS RELATIVE PERCENT: 0.9 %
EOSINOPHILS ABSOLUTE COUNT: 0 10*9/L (ref 0.0–0.5)
EOSINOPHILS RELATIVE PERCENT: 0.6 %
HEMATOCRIT: 29.7 % — ABNORMAL LOW (ref 34.0–44.0)
HEMOGLOBIN: 10.4 g/dL — ABNORMAL LOW (ref 11.3–14.9)
LYMPHOCYTES ABSOLUTE COUNT: 1.1 10*9/L (ref 1.1–3.6)
LYMPHOCYTES RELATIVE PERCENT: 49.3 %
MEAN CORPUSCULAR HEMOGLOBIN CONC: 35.1 g/dL (ref 32.0–36.0)
MEAN CORPUSCULAR HEMOGLOBIN: 40.3 pg — ABNORMAL HIGH (ref 25.9–32.4)
MEAN CORPUSCULAR VOLUME: 114.8 fL — ABNORMAL HIGH (ref 77.6–95.7)
MEAN PLATELET VOLUME: 7.5 fL (ref 6.8–10.7)
MONOCYTES ABSOLUTE COUNT: 0.2 10*9/L — ABNORMAL LOW (ref 0.3–0.8)
MONOCYTES RELATIVE PERCENT: 10.2 %
NEUTROPHILS ABSOLUTE COUNT: 0.9 10*9/L — ABNORMAL LOW (ref 1.8–7.8)
NEUTROPHILS RELATIVE PERCENT: 39 %
PLATELET COUNT: 175 10*9/L (ref 150–450)
RED BLOOD CELL COUNT: 2.59 10*12/L — ABNORMAL LOW (ref 3.95–5.13)
RED CELL DISTRIBUTION WIDTH: 14.7 % (ref 12.2–15.2)
WBC ADJUSTED: 2.2 10*9/L — ABNORMAL LOW (ref 3.6–11.2)

## 2022-10-04 LAB — COMPREHENSIVE METABOLIC PANEL
ALBUMIN: 3.6 g/dL (ref 3.4–5.0)
ALKALINE PHOSPHATASE: 70 U/L (ref 46–116)
ALT (SGPT): 57 U/L — ABNORMAL HIGH (ref 10–49)
ANION GAP: 5 mmol/L (ref 5–14)
AST (SGOT): 52 U/L — ABNORMAL HIGH (ref <=34)
BILIRUBIN TOTAL: 0.3 mg/dL (ref 0.3–1.2)
BLOOD UREA NITROGEN: 16 mg/dL (ref 9–23)
BUN / CREAT RATIO: 21
CALCIUM: 9.1 mg/dL (ref 8.7–10.4)
CHLORIDE: 106 mmol/L (ref 98–107)
CO2: 29 mmol/L (ref 20.0–31.0)
CREATININE: 0.75 mg/dL
EGFR CKD-EPI (2021) FEMALE: >90 mL/min/{1.73_m2} (ref >=60)
GLUCOSE RANDOM: 150 mg/dL (ref 70–179)
POTASSIUM: 4.2 mmol/L (ref 3.4–4.8)
PROTEIN TOTAL: 6.5 g/dL (ref 5.7–8.2)
SODIUM: 140 mmol/L (ref 135–145)

## 2022-10-04 LAB — SMEAR - BONE MARROW PATIENT

## 2022-10-04 LAB — LACTATE DEHYDROGENASE: LACTATE DEHYDROGENASE: 446 U/L — ABNORMAL HIGH (ref 120–246)

## 2022-10-04 MED ADMIN — LORazepam (ATIVAN) injection 0.5 mg: .5 mg | INTRAVENOUS | @ 21:00:00 | Stop: 2022-10-04

## 2022-10-04 MED ADMIN — LORazepam (ATIVAN) injection 1.5 mg: 1.5 mg | INTRAVENOUS | @ 19:00:00 | Stop: 2022-10-04

## 2022-10-04 MED ADMIN — methotrexate (Preservative Free) 12 mg, hydrocortisone sod succ (Solu-CORTEF) 50 mg in sodium chloride (NS) 0.9 % 6 mL INTRATHECAL syringe: INTRATHECAL | @ 21:00:00 | Stop: 2022-10-04

## 2022-10-04 MED ADMIN — heparin, porcine (PF) 100 unit/mL injection 500 Units: 500 [IU] | INTRAVENOUS | @ 21:00:00 | Stop: 2022-10-05

## 2022-10-04 NOTE — Unmapped (Signed)
15:00 - Pt labs reviewed, stable. Drug request released to pharmacy      15:45 - consent obtained and time out performed prior to LP. Pt medicated per order with Ativan prior to procedure and pt confirms not driving today. Port flushed and de-accessed per protocol. IT Chemo to be administered by Sindy Guadeloupe NP     17:30 - pt tolerated procedure well and laid flat for 1 hour post procedure. Pt declined copy of AVS and ambulated from treatment area with stable gait.

## 2022-10-04 NOTE — Unmapped (Addendum)
Please leave dressing in place and keep it dry for 24 hrs before removing. You can resume normal activities tomorrow, but take things easy today.     You may take tylenol as needed for discomfort at the area where the biopsy was taken.   After receiving Ativan, please do not drive today.     If you have questions between 8am to 5 pm Monday through Friday please call [phone] and speak to the operator.      For emergencies, evenings or weekends, please call [phone] and ask for oncology fellow on call.     Reasons to call emergency line may include:   Fever of 100.5 or greater   Nausea and/or vomiting not relieved with nausea medicine   Diarrhea or constipation   Severe pain not relieved with usual pain regimen       Lab on 10/04/2022   Component Date Value Ref Range Status    LDH 10/04/2022 446 (H)  120 - 246 U/L Final    Sodium 10/04/2022 140  135 - 145 mmol/L Final    Potassium 10/04/2022 4.2  3.4 - 4.8 mmol/L Final    Chloride 10/04/2022 106  98 - 107 mmol/L Final    CO2 10/04/2022 29.0  20.0 - 31.0 mmol/L Final    Anion Gap 10/04/2022 5  5 - 14 mmol/L Final    BUN 10/04/2022 16  9 - 23 mg/dL Final    Creatinine 57/84/6962 0.75  0.55 - 1.02 mg/dL Final    BUN/Creatinine Ratio 10/04/2022 21   Final    eGFR CKD-EPI (2021) Female 10/04/2022 >90  >=60 mL/min/1.63m2 Final    eGFR calculated with CKD-EPI 2021 equation in accordance with [employer] and [employer] of Nephrology Task Force recommendations.    Glucose 10/04/2022 150  70 - 179 mg/dL Final    Calcium 95/28/4132 9.1  8.7 - 10.4 mg/dL Final    Albumin 44/08/270 3.6  3.4 - 5.0 g/dL Final    Total Protein 10/04/2022 6.5  5.7 - 8.2 g/dL Final    Total Bilirubin 10/04/2022 0.3  0.3 - 1.2 mg/dL Final    AST 53/66/4403 52 (H)  <=34 U/L Final    ALT 10/04/2022 57 (H)  10 - 49 U/L Final    Alkaline Phosphatase 10/04/2022 70  46 - 116 U/L Final    WBC 10/04/2022 2.2 (L)  3.6 - 11.2 10*9/L Final    RBC 10/04/2022 2.59 (L)  3.95 - 5.13 10*12/L Final    HGB 10/04/2022 10.4 (L)  11.3 - 14.9 g/dL Final    HCT 47/42/5956 29.7 (L)  34.0 - 44.0 % Final    MCV 10/04/2022 114.8 (H)  77.6 - 95.7 fL Final    MCH 10/04/2022 40.3 (H)  25.9 - 32.4 pg Final    MCHC 10/04/2022 35.1  32.0 - 36.0 g/dL Final    RDW 38/75/6433 14.7  12.2 - 15.2 % Final    MPV 10/04/2022 7.5  6.8 - 10.7 fL Final    Platelet 10/04/2022 175  150 - 450 10*9/L Final    Neutrophils % 10/04/2022 39.0  % Final    Lymphocytes % 10/04/2022 49.3  % Final    Monocytes % 10/04/2022 10.2  % Final    Eosinophils % 10/04/2022 0.6  % Final    Basophils % 10/04/2022 0.9  % Final    Absolute Neutrophils 10/04/2022 0.9 (L)  1.8 - 7.8 10*9/L Final    Absolute  Lymphocytes 10/04/2022 1.1  1.1 - 3.6 10*9/L Final    Absolute Monocytes 10/04/2022 0.2 (L)  0.3 - 0.8 10*9/L Final    Absolute Eosinophils 10/04/2022 0.0  0.0 - 0.5 10*9/L Final    Absolute Basophils 10/04/2022 0.0  0.0 - 0.1 10*9/L Final    Macrocytosis 10/04/2022 Moderate (A)  Not Present Final

## 2022-10-04 NOTE — Unmapped (Signed)
Date of Service: 10/04/2022      Patient Active Problem List   Diagnosis    Leukocytosis    B-cell acute lymphoblastic leukemia (ALL) (CMS-HCC)    Acute cough    Dyspepsia    Chronic frontal sinusitis    Pancytopenia (CMS-HCC)    Neutropenia with fever (CMS-HCC)    Blurred vision, bilateral    Anxiety    Hypogammaglobulinemia (CMS-HCC)    Hypokalemia    Acute tonsillitis    Allergic conjunctivitis    Chest pain with low risk for cardiac etiology    Chronic back pain    Chronic pansinusitis    Depression    Dog bite    Generalized anxiety disorder    Heart palpitations    Hydronephrosis, right    Hypertension    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 B-cell acute lymphoblastic leukemia (ALL) (CMS-HCC)  C91.00 CSF protein     Glucose, CSF     Hempath Leukemia Flowcytometry, CSF     methotrexate (Preservative Free) 12 mg, hydrocortisone sod succ (Solu-CORTEF) 50 mg in sodium chloride (NS) 0.9 % 6 mL INTRATHECAL syringe     Clinic Procedure Appointment Request     Clinic Procedure Appointment Request     Discontinue     LORazepam (ATIVAN) injection 1.5 mg     Treatment conditions     Physician communication order     Treatment conditions     Physician communication order      2. 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 All questions were answered. The patient verbalized understanding and signed informed consent. After a time-out in which his patient identifiers were checked by 2 providers, the patient was laid in prone position on the table.   The  posterior superior iliac spine and iliac crest were cleaned, prepped and draped in the usual sterile fashion.     Anesthetic agent used: 2% plain lidocaine.      Utilizing a Ranfac needle, a bone marrow aspiration and biopsy was performed.  Specimen was sent for routine histopathologic stains and sectioning, flow cytometry, cytogenetics, and molecular analysis.     A pressure dressing was applied to the biopsy site.  Patient tolerated the procedure well.  Hemostasis was confirmed upon discharge.     The patient was given verbal instructions for wound care, such as to keep the biopsy site dry, covered for 24 hours, and to call your physician for a temperature > 100.5.  Tylenol may be taken for discomfort.  Specimens Collected:  EDTA x 2  Heparin x 2  Core biopsy x 1

## 2022-10-04 NOTE — Unmapped (Signed)
13:30 - pt ambulated with stable gait to treatment area accompanied by family member for planned BMBx and then LP for IT Chemo     13:40 - pt consent obtained for planned BMBx - Performed TIME OUT prior to BMBx. Pt counts stable and pt medicated per order with Ativan prior to procedure.      14:10 - pt tolerated BMBx and awaiting next procedure, resting in NAD

## 2022-10-04 NOTE — Unmapped (Signed)
[facility] Clinical Assessment & Refill Coordination Note    Patricia [name], DOB: 1976-07-08  Phone: [phone] (home)     All above HIPAA information was verified with patient.     Was a Nurse, learning disability used for this call? No    Specialty Medication(s):   Hematology/Oncology: Sprycel 70mg  and Prevymis     Current Outpatient Medications   Medication Sig Dispense Refill    acetaminophen (TYLENOL 8 HOUR) 650 MG CR tablet Take 2 tablets (1,300 mg total) by mouth every eight (8) hours as needed for pain.      albuterol HFA 90 mcg/actuation inhaler Inhale 2 puffs every six (6) hours as needed for wheezing.      cetirizine (ZYRTEC) 10 MG tablet Take 1 tablet (10 mg total) by mouth nightly. 30 tablet 2    dapsone 100 MG tablet TAKE 1 TABLET(100 MG) BY MOUTH DAILY 30 tablet 11    dasatinib (SPRYCEL) 70 MG tablet Take 1 tablet (70 mg total) by mouth daily. 30 tablet 7    escitalopram oxalate (LEXAPRO) 10 MG tablet Take 1 tablet (10 mg total) by mouth daily. 90 tablet 0    fluconazole (DIFLUCAN) 200 MG tablet Take 1 tablet (200 mg total) by mouth daily. 30 tablet 2    letermovir (PREVYMIS) 480 mg tablet Take 1 tablet (480 mg total) by mouth daily. 28 tablet 2    levalbuterol (XOPENEX) 0.31 mg/3 mL nebulizer solution Inhale 3 mL (0.31 mg total) by nebulization every four (4) hours as needed for wheezing (cough). 45 mL 0    levoFLOXacin (LEVAQUIN) 500 MG tablet Take 1 tablet (500 mg total) by mouth daily. 30 tablet 2    norethindrone (MICRONOR) 0.35 mg tablet Take 1 tablet by mouth daily. 84 tablet 3    predniSONE (DELTASONE) 50 MG tablet Take 4 tablets (200 mg total) by mouth once daily on days 1-5 of each cycle. 20 tablet 5    tizanidine (ZANAFLEX) 4 MG tablet Take 1 tablet (4 mg total) by mouth nightly. 90 tablet 2    triamcinolone (KENALOG) 0.1 % cream Apply topically two (2) times a day for 14 days. 45 g 0    valGANciclovir (VALCYTE) 450 mg tablet Take 2 tablets (900 mg total) by mouth two (2) times a day. 60 tablet 0     No current facility-administered medications for this visit.     Facility-Administered Medications Ordered in Other Visits   Medication Dose Route Frequency Provider Last Rate Last Admin    methotrexate (Preservative Free) 12 mg, hydrocortisone sod succ (Solu-CORTEF) 50 mg in sodium chloride (NS) 0.9 % 6 mL INTRATHECAL syringe   Intrathecal Once [name], MD            Changes to medications:  Has not started prevymis yet.    Allergies   Allergen Reactions    Erythromycin Hives     Other reaction(s): Not available    Other      Pt cant take ibuprofen due to condition.    Sulfa (Sulfonamide Antibiotics) Anaphylaxis     Other reaction(s): Not available    Azithromycin        Changes to allergies: No    SPECIALTY MEDICATION ADHERENCE     Sprycel 70 mg: ~10 days of medicine on hand   Prevymis 480 mg: 30 days of medicine on hand     Are there any concerns with adherence? No    Adherence counseling provided? Not needed  Patient-Reported Symptoms Tracker for Cancer Patients on Oral Chemotherapy     Oral chemotherapy medication name(s): Sprycel  Dose and frequency: 70 mg once daily  Oral Chemotherapy Start Date:    Baseline? No  Clinic(s) visited: Hematology    Symptom Grouping Question Patient Response   Digestion and Eating Have you felt sick to your stomach? Denies    Had diarrhea? Denies    Constipated? Denies    Not wanting to eat? Denies    Comments      Sleep and Pain Felt very tired even after you rest? Denies    Pain due to cancer medication or cancer? Denies    Comments     Other Side Effects Numbness or tingling in hands and/or feet? Denies    Felt short of breath? Denies    Mouth or throat Sores? Denies    Rash? Denies    Palmar-plantar erythrodysesthesia syndrome?      Rash - acneiform?      Rash - maculo-papular?      How many days over the past month did your cancer medication or cancer keep you from your normal activities?  Write in number of days, 0-30:  0    Other side effects or things you would like to discuss?      Comments?     Adherence  In the last 30 days, on how many days did you miss at least one dose of any of your [drug name]? Write in number of days, 0-30:  0    What reasons are you having trouble taking your medication [pharmacist: check all that apply]? Specify chemotherapy cycle:             Comments:        Comments       Optional Symptom Tracking Comments: Prevymis not started yet per provider instructions    CLINICAL MANAGEMENT AND INTERVENTION      Clinical Benefit Assessment:    Do you feel the medicine is effective or helping your condition? Yes    Clinical Benefit counseling provided? Not needed    Acute Infection Status:    Acute infections noted within Epic:  No active infections    Patient reported infection: None    Therapy Appropriateness:    Is therapy appropriate and patient progressing towards therapeutic goals? Yes, therapy is appropriate and should be continued    DISEASE/MEDICATION-SPECIFIC INFORMATION      N/A    Is the patient receiving adequate infection prevention treatment? Yes, fluconazole, levofloxacin and valganciclovir    Does the patient have adequate nutritional support? Not applicable    PATIENT SPECIFIC NEEDS     Does the patient have any physical, cognitive, or cultural barriers? No    Is the patient high risk? Yes, patient is taking oral chemotherapy. Appropriateness of therapy as been assessed    Did the patient require a clinical intervention? No    Does the patient require physician intervention or other additional services (i.e., nutrition, smoking cessation, social work)? No    SOCIAL DETERMINANTS OF HEALTH     At the [facility], we have learned that life circumstances - like trouble affording food, housing, utilities, or transportation can affect the health of many of our patients.   That is why we wanted to ask: are you currently experiencing any life circumstances that are negatively impacting your health and/or quality of life? Patient declined to answer    Social Determinants of Health  Financial Resource Strain: Low Risk  (11/02/2021)    Overall Financial Resource Strain (CARDIA)     Difficulty of Paying Living Expenses: Not hard at all   Internet Connectivity: Not on file   Food Insecurity: No Food Insecurity (11/02/2021)    Hunger Vital Sign     Worried About Running Out of Food in the Last Year: Never true     Ran Out of Food in the Last Year: Never true   Tobacco Use: Medium Risk (07/19/2022)    Patient History     Smoking Tobacco Use: Former     Smokeless Tobacco Use: Never     Passive Exposure: Not on file   Housing/Utilities: Low Risk  (11/02/2021)    Housing/Utilities     Within the past 12 months, have you ever stayed: outside, in a car, in a tent, in an overnight shelter, or temporarily in someone else's home (i.e. couch-surfing)?: No     Are you worried about losing your housing?: No     Within the past 12 months, have you been unable to get utilities (heat, electricity) when it was really needed?: No   Alcohol Use: Unknown (09/10/2019)    Received from [facility] visits prior to 10/01/2022.    AUDIT-C     Frequency of Alcohol Consumption: Monthly or less     Average Number of Drinks: Not asked     Frequency of Binge Drinking: Not asked   Transportation Needs: No Transportation Needs (11/02/2021)    PRAPARE - Transportation     Lack of Transportation (Medical): No     Lack of Transportation (Non-Medical): No   Substance Use: Not on file   Health Literacy: Not on file   Physical Activity: Not on file   Interpersonal Safety: Not on file   Stress: Not on file   Intimate Partner Violence: Not on file   Depression: Not at risk (10/16/2019)    Received from [facility] visits prior to 10/01/2022.    PHQ-2     SDOH PHQ2 SCORE: 0   Social Connections: Not on file     Would you be willing to receive help with any of the needs that you have identified today? Not applicable       SHIPPING Specialty Medication(s) to be Shipped:   Hematology/Oncology: Sprycel 70 mg    Other medication(s) to be shipped: No additional medications requested for fill at this time     Changes to insurance: No    Patient was notified of new phone menu:  no change to oncology phone menu    Delivery Scheduled: Yes, Expected medication delivery date: 10/12/22.     Medication will be delivered via Next Day Courier to the confirmed prescription address in [facility].    The patient will receive a drug information handout for each medication shipped and additional FDA Medication Guides as required.  Verified that patient has previously received a Conservation officer, historic buildings and a Surveyor, mining.    The patient or caregiver noted above participated in the development of this care plan and knows that they can request review of or adjustments to the care plan at any time.      All of the patient's questions and concerns have been addressed.    Kermit Balo, [facility]   [facility] Specialty Pharmacist

## 2022-10-04 NOTE — Unmapped (Unsigned)
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 Now being treated, awaiting results of latest CMV test.  - Cont tylenol 650mg  TID PRN    #Blurred Vision: Stable, she feels is likely attributable to needing reading glasses. Feels like it comes and goes. Hasn't seen eye doctor in years. Is a known side effect of methotrexate. Feeling like she is having more nearsightedness and might need bifocals and/or a new prescription.    #Anxiety/Coping Support: Continues to deal with low mood/anxiety related to not being able to go back to work and [employer] of her ANC counts. She put herself back on prior prescription of lexapro (started 9/8). Doing well on her current dose and reports no recent panic attacks. Still has a racing mind at night and occasional heart flutters.  - Cont lexapro 10mg  daily   - Continue working with local therapist - been working with him since brother died  - Feeling hopeful about going back to work in the next few months. 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 She has decided she is not going to go back this school year which has been hard for her.  - Shares that she is working on projects to raise money for her friend's school. She also is working on Insurance account manager a blood cancer workbook for others going through this. She wants to use it to raise funds for parking and other supports for cancer patients.    #Advanced Care Planning  - She and her husband have an excellent understanding of the current course of her treatment.  She recognizes that she is responding well and there is some uncertainty about how many rounds of treatment she will have to go through.  Has heard that the goal of treatment is curative, and she remains very hopeful for this.  Has been able to tolerate treatment fairly well to this point. Her major concern is about side effects from lumbar punctures.  Current focus is on cancer treatment and survival while maintaining quality of life is much as possible.    Health Care Decision Maker as of 10/04/2022    HCDM (patient stated preference): [name] Spouse - [phone]      # Controlled substances risk management.   - Patient does not have a signed pain medication agreement with our team.  No controlled substances being prescribed by our team   - [place] database was reviewed today and it was appropriate.   - Urine drug screen was not performed at this visit. Findings: not applicable.   - Patient has received information about safe storage and administration of medications.   - Patient has not received a prescription for narcan; is not applicable.       F/u: 8-10 weeks    ----------------------------------------  Referring Provider: From inpatient palliative care team  Oncology Team: Malignant hematology  PCP: [name], MD    Interval Hx, 10/04/22:    She is doing well today after a week with some lows in her mood after her ANC went down again to 0.1. She lets herself wallow for a few days and then gets back to working on her projects. She is excited for some of the projects she is working  on to raise money for her friend's school, blending her craftiness and her teacher sides. 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 This was hard to accept at first.    Getting an LP and bone marrow today. 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 They have now been testing others for it and have found a few more positives. She is glad her case has helped them figure out care for others. She hopes to create a blood cancer workbook with her story and a calendar format for tracking blood counts and other info. She wants to help other cancer patients and use the book to raise money for parking and other things for future patients.    HPI: Ph+ ALL, currently in remission.    Current cancer-directed therapy: maintenance vincristine 2g, Prednisone 200mg  D1-4 +dasatinib       Palliative Performance Scale: 90% - Ambulation: Full / Normal Activity, some evidence of disease / Self-Care:Full / Intake: Normal / Level of Conscious: Full      Coping/Support Issues: She and her husband have multiple stressors but overall feels like she is coping fairly well right now.  She will reach out to our palliative care team if new concerns emerge.    Goals of Care:  Focus on cancer directed therapy and survival while hoping to maintain her quality of life is much as possible    Social History:   Name of primary support: Husband [name]  occupation: 6 grade teacher      Advance Care Planning:   HCPOA: Spouse  Natural surrogate decision maker: Husband Industrial/product designer  Living Will: no  ACP note:     Objective     Opioid Risk Tool:    Female  Female    Family history of substance abuse      Alcohol  1  3    Illegal drugs  2  3    Rx drugs  4  4    Personal history of substance abuse      Alcohol  3  3    Illegal drugs  4  4    Rx drugs  5  5    Age between 16--45 years  1  1    History of preadolescent sexual abuse  3  0    Psychological disease      ADD, OCD, bipolar, schizophrenia  2  2    Depression  1  1       Total: Low  (<3 low risk, 4-7 moderate risk, >8 high risk)    Hematology/Oncology History Overview Note   Referring/Local Oncologist:    Diagnosis:   10/29/2021  Bone marrow, left iliac, aspiration and biopsy  -  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 The majority of the abnormal cells (64/88) contained an additional BCR/ABL1 fusion signal, while 9/88 abnormal cells contained an additional ABL1 and ASS1 signal.       Genetics:   Karyotype/FISH:   RESULTS   Date Value Ref Range Status  10/28/2021   Final    NOTE: This report reflects a combined study from a peripheral blood and a bone marrow core biopsy. Eleven cells from the peripheral blood and nine cells from the bone marrow core biopsy were analyzed. The BCR/ABL1 FISH analysis was performed on the peripheral blood.     Abnormal Karyotype:  47,XX,t(9;22)(q34;q11.2),+der(22)t(9;22)[18]/46,XX[2]    Abnormal FISH:  A BCR/ABL1 interphase FISH assay showed a signal pattern consistent with a BCR::ABL1 rearrangement and the 9;22 translocation in 88% of the 100 cells scored. The majority of the abnormal cells (64/88) contained an additional BCR/ABL1 fusion signal, while 9/88 abnormal cells contained an additional ABL1 and ASS1 signal.           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  MRD by flow insufficient, p190 2/100,000  12/10/2021: Cycle 2 B cycle + rituximab  12/14/21: IT#4  12/17/21: IT#5  01/10/22: Cycle 3 A cycle + rituximab  01/11/22: IT #6  01/24/22: Bmbx - MRD-neg by PCR (p190)   02/17/22: C4 B cycle + ritux  02/18/22: IT#7  03/21/22: C5 A cycle + ritux (4th dose)  03/22/22: IT#8  04/29/22: Bmbx - MRD-neg by PCR (p190)   05/18/22: restart dasatinib 70mg  s/p neutropenia  05/26/22: Start maintenance vincristine 2g, Prednisone 200mg  D1-5   06/02/22: IT #9  06/27/22: C2 Maintenance: vincristine 2g, Prednisone 200mg  D1-5, HOLD dasatinib due to neutropenia  07/04/22: RESTART dasatinib 70mg   07/21/22: IT #10  07/26/22: C3 Maintenance: vincristine 2g, Prednisone 200mg  D1-5, dasatinib 70 mg  08/08/22: IT #11 - prophylaxis completed  08/23/22: C4 Maintenance: vincristine 2g, Prednisone 200mg  D1-5, dasatinib 70 mg  09/20/22: C5 Maintenance: vincristine 2g, Prednisone 200mg  D1-5, dasatinib 70 mg     B-cell acute lymphoblastic leukemia (ALL) (CMS-HCC)   10/29/2021 Initial Diagnosis    B-cell acute lymphoblastic leukemia (ALL) (CMS-HCC)     10/30/2021 - 03/30/2022 Chemotherapy    IP/OP LEUKEMIA GRAAPH-2005 < 60 YO (OP PEGFILGRASTIM ON DAY 7)  [No description for this plan]     01/06/2022 - 04/07/2022 Endocrine/Hormone Therapy    OP LEUPROLIDE (LUPRON) 11.25 MG EVERY 3 MONTHS  Plan Provider: [name], MD     05/26/2022 -  Chemotherapy    OP LEUKEMIA VINCRISTINE  vinCRIStine 2 mg IV on day 1     07/26/2022 Endocrine/Hormone Therapy    OP LEUPROLIDE (LUPRON) 11.25 MG EVERY 3 MONTHS  Plan Provider: [name], MD           REVIEW OF SYSTEMS:  A comprehensive review of 10 systems was negative except for pertinent positives noted in HPI.      PHYSICAL EXAM:   There were no vitals filed for this visit.  GEN: Awake and alert, pleasant appearing female in no acute distress. Hair is growing back, much more than last visit  LUNGS: No increased work of breathing on room air.  PSYCH: Alert and oriented to person, place and time. Excited when talking about her projects.  SKIN: No rashes, petechiae or jaundice noted    I personally spent 45 minutes face-to-face and non-face-to-face in the care of this patient, which includes all pre, intra, and post visit time  on the date of service.  All documented time was specific to the E/M visit and does not include any procedures that may have been performed.     Maeola Sarah, MD  [facility] Outpatient Oncology Palliative Care just as an in-person visit OR time which includes both minutes on video and pre/post minutes to determine the level of service.      :[id number]}  The patient reports they are physically located in [place] and is currently: at home. I conducted a audio/video visit. I spent  0s on the video call with the patient. I spent an additional 15 minutes on pre- and post-visit activities on the date of service .       Maeola Sarah, MD  [facility] Outpatient Oncology Palliative Care

## 2022-10-04 NOTE — Unmapped (Signed)
IMMUNOCOMPROMISED HOST INFECTIOUS DISEASE CONSULT NOTE    Patricia [name] is being seen in consultation at the request of [name] for evaluation of CMV viremia    Assessment/Recommendations:    Patricia [name] is a 47 y.o. 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 With low level of CMV and fevers during neutropenia, I am not convinced that CMV is the cause of the fevers. 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 She has no symptoms of CMV disease otherwise I might be more concerned about her CMV viremia. I believe that she has neutropenic fevers that can be managed with levo + flucon or Levo/Augmentin/Fluconazole as an outpatient given that she is asymptomatic and low risk at this time. If she continues to have fevers, would obtain CT chest and CT sinus to investigate possible fungal PNA. For now, we can continue to monitor CMV for the next month or so and then stop. No reason to treat CMV at this time.     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  -S/p 5 cycles GRAAPH-2005 + Dasatinib (treatment includes Rituximab)  -Now in MRD- CR1  -Receiving consolidation w Vincristine, pred, dasatinib    #Hemorrhoids + anal fissure 05/05/22  -resolved    Active Infections  #Fever of unknown origin 05/11/22  -10/11 Normal CT A/P, CT chest with mild diffuse bronchial wall thickening  -10/11 CT sinus with mild mucosal thickening   -Bld cx neg, urine culture w 10-50K E.faecalis, not treated  -CMV viremia as below  -S/p Cefepime 10/12-10/14, levo 10/14 x 5 days   Rx: Levo and fluconazole 11/21-->continue until no longer neutropenic    #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  -Asymptomatic     Antimicrobial allergies/intolerances: Erythromycin, Azithro, Sulfa       RECOMMENDATIONS    Diagnostic  Repeat CMV viral load today and Q week for now while she is having on and off fevers  RPP today  Blood cultures today  If her fevers continue or start up again, in 4-7 days she should undergo CT chest and CT sinus to evaluate for fungal PNA    Treatment  Augmentin if she develops fevers again and then continue for 1 week or until fevers resolve (Rx written)  Continue levo and fluconazole while neutropenic    Monitoring for antimicrobial toxicities  BINDI TIPSWORD is currently receiving drug therapy requiring intensive lab monitoring for toxicity.  NA    Prophylaxis  Valtrex and Dapsone  On levofloxacin and fluconazole while neutropenic  COVID, Flu vaccines    Follow up in 2-3 weeks           Recommendations were communicated via shared medical record.    History of Present Illness:      Source of information includes:  Electronic Medical Records and Discussion with patient            Allergies:  Allergies   Allergen Reactions    Erythromycin Hives     Other reaction(s): Not available    Other      Pt cant take ibuprofen due to condition.    Sulfa (Sulfonamide Antibiotics) Anaphylaxis     Other reaction(s): Not available    Azithromycin        Medications:     Current Outpatient Medications:     acetaminophen (TYLENOL 8 HOUR) 650 MG CR tablet, Take 2 tablets (1,300 mg total) by mouth every eight (8) hours as needed for pain.,  Disp: , Rfl:     albuterol HFA 90 mcg/actuation inhaler, Inhale 2 puffs every six (6) hours as needed for wheezing., Disp: , Rfl:     cetirizine (ZYRTEC) 10 MG tablet, Take 1 tablet (10 mg total) by mouth nightly., Disp: 30 tablet, Rfl: 2    dapsone 100 MG tablet, TAKE 1 TABLET(100 MG) BY MOUTH DAILY, Disp: 30 tablet, Rfl: 11    dasatinib (SPRYCEL) 70 MG tablet, Take 1 tablet (70 mg total) by mouth daily., Disp: 30 tablet, Rfl: 7    escitalopram oxalate (LEXAPRO) 10 MG tablet, Take 1 tablet (10 mg total) by mouth daily., Disp: 90 tablet, Rfl: 0    fluconazole (DIFLUCAN) 200 MG tablet, Take 1 tablet (200 mg total) by mouth daily., Disp: 30 tablet, Rfl: 2    letermovir (PREVYMIS) 480 mg tablet, Take 1 tablet (480 mg total) by mouth daily., Disp: 28 tablet, Rfl: 2    levalbuterol (XOPENEX) 0.31 mg/3 mL nebulizer solution, Inhale 3 mL (0.31 mg total) by nebulization every four (4) hours as needed for wheezing (cough)., Disp: 45 mL, Rfl: 0    levoFLOXacin (LEVAQUIN) 500 MG tablet, Take 1 tablet (500 mg total) by mouth daily., Disp: 30 tablet, Rfl: 2    norethindrone (MICRONOR) 0.35 mg tablet, Take 1 tablet by mouth daily., Disp: 84 tablet, Rfl: 3    predniSONE (DELTASONE) 50 MG tablet, Take 4 tablets (200 mg total) by mouth once daily on days 1-5 of each cycle., Disp: 20 tablet, Rfl: 5    tizanidine (ZANAFLEX) 4 MG tablet, Take 1 tablet (4 mg total) by mouth nightly., Disp: 90 tablet, Rfl: 2    triamcinolone (KENALOG) 0.1 % cream, Apply topically two (2) times a day for 14 days., Disp: 45 g, Rfl: 0    valGANciclovir (VALCYTE) 450 mg tablet, Take 2 tablets (900 mg total) by mouth two (2) times a day., Disp: 60 tablet, Rfl: 0  No current facility-administered medications for this visit.    Facility-Administered Medications Ordered in Other Visits:     methotrexate (Preservative Free) 12 mg, hydrocortisone sod succ (Solu-CORTEF) 50 mg in sodium chloride (NS) 0.9 % 6 mL INTRATHECAL syringe, , Intrathecal, Once, [name], MD    Current antibiotics:  Valtrex and Dapsone ppx  Levo and Flucon while neutropenic    Previous antibiotics:  Levo, Cefepime  Flucon while neutropenic    Current/Prior immunomodulators:  None    Other medications reviewed.     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 Her smoking use included cigarettes. She has never used smokeless tobacco.   Alcohol use:    reports that she does not currently use alcohol.   Drug use:    reports no history of drug use.   Living situation:  Lives with  spouse/partner   [place]     Family History:  No family history on file.    Review of Systems:  All other systems reviewed are negative.     Objective     Vital Signs:  There were no vitals taken for this visit.    Physical Exam:     Const [x]  vital signs above    [x]  NAD, non-toxic appearance []  Chronically ill-appearing, non-distressed        Eyes [x]  Lids normal bilaterally, conjunctiva anicteric and noninjected OU     [] PERRL  [] EOMI        ENMT [x]  Normal appearance of external nose and ears, no nasal discharge        []  MMM, no lesions on lips or gums []  No thrush, leukoplakia, oral lesions  []  Dentition good []  Edentulous []  Dental caries present  []  Hearing normal  []  TMs with good light reflexes bilaterally         Neck [x]  Neck of normal appearance and trachea midline        []  No thyromegaly, nodules, or tenderness   []  Full neck ROM        Lymph [x]  No LAD in neck     [x]  No LAD in supraclavicular area     []  No LAD in axillae   []  No LAD in epitrochlear chains     []  No LAD in inguinal areas        CV [x]  RRR            [x]  No peripheral edema     []  Pedal pulses intact   [x]  No abnormal heart sounds appreciated   [x]  Extremities WWP         Resp [x]  Normal WOB at rest    [x]  No breathlessness with speaking, no coughing  [x]  CTA anteriorly    [x]  CTA posteriorly          GI [x]  Normal inspection, NTND   [x]  NABS     [x]  No umbilical hernia on exam       [x]  No hepatosplenomegaly     []  Inspection of perineal and perianal areas normal        GU []  Normal external genitalia     [] No urinary catheter present in urethra   []  No CVA tenderness    []  No tenderness over renal allograft        MSK [x]  No clubbing or cyanosis of hands       []  No vertebral point tenderness  []  No focal tenderness or abnormalities on palpation of joints in RUE, LUE, RLE, or LLE        Skin []  No rashes, lesions, or ulcers of visualized skin     [x]  Skin warm and dry to palpation   Erythematous scaly patch on R upper chest ~1.5cm      Neuro [x]  Face expression symmetric  []  Sensation to light touch grossly intact throughout    []  Moves extremities equally    [x]  No tremor noted        []  CNs II-XII grossly intact     []  DTRs normal and symmetric throughout []  Gait unremarkable        Psych [x]  Appropriate affect       [x]  Fluent speech         [x]  Attentive, good eye contact  []  Oriented to person, place, time          [  x] Judgment and insight are appropriate             Labs:  Results in Past 30 Days  Result Component Current Result Ref Range Previous Result Ref Range   Absolute Eosinophils 0.0 (10/04/2022) 0.0 - 0.5 10*9/L 0.1 (09/26/2022) 0.0 - 0.5 10*9/L   Absolute Lymphocytes 1.1 (10/04/2022) 1.1 - 3.6 10*9/L 1.2 (09/26/2022) 1.1 - 3.6 10*9/L   Absolute Neutrophils 0.9 (L) (10/04/2022) 1.8 - 7.8 10*9/L 0.2 (LL) (09/26/2022) 1.8 - 7.8 10*9/L   Alkaline Phosphatase 70 (10/04/2022) 46 - 116 U/L 66 (09/26/2022) 46 - 116 U/L   ALT 57 (H) (10/04/2022) 10 - 49 U/L 65 (H) (09/26/2022) 10 - 49 U/L   AST 52 (H) (10/04/2022) <=34 U/L 46 (H) (09/26/2022) <=34 U/L   BUN 16 (10/04/2022) 9 - 23 mg/dL 21 (0/98/1191) 9 - 23 mg/dL   Calcium 9.1 (11/05/8293) 8.7 - 10.4 mg/dL 8.4 (L) (01/19/3085) 8.7 - 10.4 mg/dL   Creatinine 5.78 (11/05/9627) 0.55 - 1.02 mg/dL 5.28 (11/12/2438) 1.02 - 1.02 mg/dL   HGB 72.5 (L) (10/05/6438) 11.3 - 14.9 g/dL 9.6 (L) (3/47/4259) 56.3 - 14.9 g/dL   Magnesium 2.1 (03/07/5642) 1.6 - 2.6 mg/dL Not in Time Range    Platelet 175 (10/04/2022) 150 - 450 10*9/L 136 (L) (09/26/2022) 150 - 450 10*9/L   Potassium 4.2 (10/04/2022) 3.4 - 4.8 mmol/L 3.4 (09/26/2022) 3.4 - 4.8 mmol/L   Total Bilirubin 0.3 (10/04/2022) 0.3 - 1.2 mg/dL 0.5 (10/28/5186) 0.3 - 1.2 mg/dL   WBC 2.2 (L) (10/30/6604) 3.6 - 11.2 10*9/L 1.7 (L) Reactive (A) 05/18/2022    Hep B Surf Ab Quant 101.02 (H) 05/18/2022    Hep B Core Total Ab Nonreactive 05/18/2022    Hepatitis C Ab Nonreactive 10/28/2021       Immunizations:  Immunization History   Administered Date(s) Administered    COVID-19 VACC,MRNA,(PFIZER)(PF) 02/20/2020, 03/12/2020    TdaP 02/08/2017, 10/21/2021       Time spent on counseling/coordination of care: 15 Minutes  Total time spent with patient: 36 Minutes        The patient reports they are physically located in [place] and is currently: at home. I conducted a audio/video visit. I spent  65m 47s on the video call with the patient. I spent an additional 15 minutes on pre- and post-visit activities on the date of service .

## 2022-10-05 ENCOUNTER — Telehealth
Admit: 2022-10-05 | Discharge: 2022-10-06 | Payer: PRIVATE HEALTH INSURANCE | Attending: Infectious Disease | Primary: Infectious Disease

## 2022-10-05 DIAGNOSIS — D61818 Other pancytopenia: Principal | ICD-10-CM

## 2022-10-05 DIAGNOSIS — C9100 Acute lymphoblastic leukemia not having achieved remission: Principal | ICD-10-CM

## 2022-10-05 DIAGNOSIS — A689 Relapsing fever, unspecified: Principal | ICD-10-CM

## 2022-10-05 DIAGNOSIS — B259 Cytomegaloviral disease, unspecified: Principal | ICD-10-CM

## 2022-10-05 LAB — CMV DNA, QUANTITATIVE, PCR
CMV QUANT: <35 [IU]/mL — ABNORMAL HIGH (ref <0)
CMV VIRAL LD: DETECTED — AB

## 2022-10-05 MED ORDER — VALACYCLOVIR 500 MG TABLET
ORAL_TABLET | Freq: Every day | ORAL | 12 refills | 30 days | Status: CP
Start: 2022-10-05 — End: 2023-10-30

## 2022-10-05 NOTE — Unmapped (Signed)
Lumbar Puncture with Intrathecal Chemotherapy Procedure Note    Today's Date:   10/04/22      Diagnosis:   1. B-cell acute lymphoblastic leukemia (ALL) (CMS-HCC)    - Treatment conditions; Standing  - Physician communication order; Standing  - methotrexate (Preservative Free) 12 mg, hydrocortisone sod succ (Solu-CORTEF) 50 mg in sodium chloride (NS) 0.9 % 6 mL INTRATHECAL syringe  - Treatment conditions  - Physician communication order  - LORazepam (ATIVAN) injection 0.5 mg  - CSF protein; Standing  - CSF protein  - Glucose, CSF; Standing  - Glucose, CSF  - Hempath Leukemia Flowcytometry, CSF; Standing  - Hempath Leukemia Flowcytometry, CSF  - heparin, porcine (PF) 100 unit/mL injection 500 Units          Indications: Scheduled IT Chemotherapy    Premedication: 0.5mg  IV Ativan. She received 1.5 mg IV Ativan earlier in the day for a bone marrow biopsy  Driver Confirmed: Yes    Procedure Details     Consent: Informed consent was obtained. Risks of the procedure were discussed including: infection, bleeding, pain and headache.    A time-out in which Her patient identifiers were checked by 2 providers was performed.    The patient was positioned under sterile conditions. Iodine solution and sterile drapes were utilized. Local anesthesia with 1% lidocaine was applied subcutaneously then deep to the skin. A spinal needle was inserted at the L3 - L4 interspace. Spinal fluid was obtained and sent to the laboratory.    Intrathecal Methotrexate was administered over 4 min.    The spinal needle with trocar was removed with minimal bleeding noted upon removal. A sterile bandage was placed over the puncture site after holding pressure.        Findings  6mL of clear spinal fluid was obtained.    Complications:  None; patient tolerated the procedure well.          Condition: stable    Plan  Bed rest for 1 hours.  Tylenol 650 mg for pain.  Call office if you develop a severe headache, nausea, vomiting, or fever greater than 100.5 F.      [name], AGPCNP-BC  Nurse Practitioner  Hematology/Oncology  [facility]

## 2022-10-05 NOTE — Unmapped (Signed)
Spoke with patient to confirm she had stopped taking her levofloxacin and fluconazole now that her ANC >0.5. She confirmed that she had stopped both this morning.     Time spent coordinating care: 5 minutes    Okey Regal, PharmD, BCOP, CPP  Acute Leukemia Clinical Pharmacist Practitioner

## 2022-10-05 NOTE — Unmapped (Signed)
Please leave dressing in place and keep it dry for 24 hrs before removing. You can resume normal activities tomorrow, but take things easy today.     You may take tylenol as needed for discomfort at the area where the biopsy was taken.   After receiving Ativan, please do not drive today.    If you have questions between 8am to 5 pm Monday through Friday please call [phone] and speak to the operator.      For emergencies, evenings or weekends, please call [phone] and ask for oncology fellow on call.     Reasons to call emergency line may include:   Fever of 100.5 or greater   Nausea and/or vomiting not relieved with nausea medicine   Diarrhea or constipation   Severe pain not relieved with usual pain regimen       Hospital Outpatient Visit on 10/04/2022   Component Date Value Ref Range Status    Protein, CSF 10/04/2022 27  20 - 59 mg/dL Final    Glucose, CSF 37/05/6268 72  48 - 79 mg/dL Final    Tube # CSF 48/54/6270 Tube 1   Preliminary    Color, CSF 10/04/2022 Colorless   Preliminary    Appearance, CSF 10/04/2022 Clear   Preliminary    Nucleated Cells, CSF 10/04/2022 1  0 - 5 ul Preliminary    RBC, CSF 10/04/2022 120 (H)  0 ul Preliminary   Hospital Outpatient Visit on 10/04/2022   Component Date Value Ref Range Status    Cytogenetics Test, Other 10/04/2022 Collected   Final    Smear - Bone Marrow Patient 10/04/2022 Prepared slide and instrument printout routed to hemepath   Final   Lab on 10/04/2022   Component Date Value Ref Range Status    ABO Grouping 10/04/2022 O POS   Final    Antibody Screen 10/04/2022 NEG   Final    LDH 10/04/2022 446 (H)  120 - 246 U/L Final    Sodium 10/04/2022 140  135 - 145 mmol/L Final    Potassium 10/04/2022 4.2  3.4 - 4.8 mmol/L Final    Chloride 10/04/2022 106  98 - 107 mmol/L Final    CO2 10/04/2022 29.0  20.0 - 31.0 mmol/L Final    Anion Gap 10/04/2022 5  5 - 14 mmol/L Final    BUN 10/04/2022 16  9 - 23 mg/dL Final    Creatinine 35/00/9381 0.75  0.55 - 1.02 mg/dL Final BUN/Creatinine Ratio 10/04/2022 21   Final    eGFR CKD-EPI (2021) Female 10/04/2022 >90  >=60 mL/min/1.40m2 Final    eGFR calculated with CKD-EPI 2021 equation in accordance with [employer] and [employer] of Nephrology Task Force recommendations.    Glucose 10/04/2022 150  70 - 179 mg/dL Final    Calcium 82/99/3716 9.1  8.7 - 10.4 mg/dL Final    Albumin 96/78/9381 3.6  3.4 - 5.0 g/dL Final    Total Protein 10/04/2022 6.5  5.7 - 8.2 g/dL Final    Total Bilirubin 10/04/2022 0.3  0.3 - 1.2 mg/dL Final    AST 01/75/1025 52 (H)  <=34 U/L Final    ALT 10/04/2022 57 (H)  10 - 49 U/L Final    Alkaline Phosphatase 10/04/2022 70  46 - 116 U/L Final    WBC 10/04/2022 2.2 (L)  3.6 - 11.2 10*9/L Final    RBC 10/04/2022 2.59 (L)  3.95 - 5.13 10*12/L Final    HGB 10/04/2022 10.4 (  L)  11.3 - 14.9 g/dL Final    HCT 16/05/9603 29.7 (L)  34.0 - 44.0 % Final    MCV 10/04/2022 114.8 (H)  77.6 - 95.7 fL Final    MCH 10/04/2022 40.3 (H)  25.9 - 32.4 pg Final    MCHC 10/04/2022 35.1  32.0 - 36.0 g/dL Final    RDW 54/04/8118 14.7  12.2 - 15.2 % Final    MPV 10/04/2022 7.5  6.8 - 10.7 fL Final    Platelet 10/04/2022 175  150 - 450 10*9/L Final    Neutrophils % 10/04/2022 39.0  % Final    Lymphocytes % 10/04/2022 49.3  % Final    Monocytes % 10/04/2022 10.2  % Final    Eosinophils % 10/04/2022 0.6  % Final    Basophils % 10/04/2022 0.9  % Final    Absolute Neutrophils 10/04/2022 0.9 (L)  1.8 - 7.8 10*9/L Final    Absolute Lymphocytes 10/04/2022 1.1  1.1 - 3.6 10*9/L Final    Absolute Monocytes 10/04/2022 0.2 (L)  0.3 - 0.8 10*9/L Final    Absolute Eosinophils 10/04/2022 0.0  0.0 - 0.5 10*9/L Final    Absolute Basophils 10/04/2022 0.0  0.0 - 0.1 10*9/L Final    Macrocytosis 10/04/2022 Moderate (A)  Not Present Final

## 2022-10-05 NOTE — Unmapped (Signed)
Called patient for virtual visit and reviewed allergies and medication. Answered questions regarding virtual appointment.   Provider was on site and patient is at home for this video visit.

## 2022-10-05 NOTE — Unmapped (Signed)
Spoke with Dr. Lavonna Rua office [facility] in [place]). Plan for patient to have CBC with diff drawn on Friday 3/8. They will fax results to [phone], likely on Monday.

## 2022-10-10 ENCOUNTER — Institutional Professional Consult (permissible substitution): Admit: 2022-10-10 | Discharge: 2022-10-11 | Payer: PRIVATE HEALTH INSURANCE

## 2022-10-10 DIAGNOSIS — C9100 Acute lymphoblastic leukemia not having achieved remission: Principal | ICD-10-CM

## 2022-10-10 LAB — CBC W/ AUTO DIFF
BASOPHILS ABSOLUTE COUNT: 0 10*9/L (ref 0.0–0.1)
BASOPHILS RELATIVE PERCENT: 0.5 %
EOSINOPHILS ABSOLUTE COUNT: 0 10*9/L (ref 0.0–0.5)
EOSINOPHILS RELATIVE PERCENT: 1.3 %
HEMATOCRIT: 27.2 % — ABNORMAL LOW (ref 34.0–44.0)
HEMOGLOBIN: 9.5 g/dL — ABNORMAL LOW (ref 11.3–14.9)
LYMPHOCYTES ABSOLUTE COUNT: 0.7 10*9/L — ABNORMAL LOW (ref 1.1–3.6)
LYMPHOCYTES RELATIVE PERCENT: 48.3 %
MEAN CORPUSCULAR HEMOGLOBIN CONC: 34.8 g/dL (ref 32.0–36.0)
MEAN CORPUSCULAR HEMOGLOBIN: 40.3 pg — ABNORMAL HIGH (ref 25.9–32.4)
MEAN CORPUSCULAR VOLUME: 116 fL — ABNORMAL HIGH (ref 77.6–95.7)
MEAN PLATELET VOLUME: 7.3 fL (ref 6.8–10.7)
MONOCYTES ABSOLUTE COUNT: 0.2 10*9/L — ABNORMAL LOW (ref 0.3–0.8)
MONOCYTES RELATIVE PERCENT: 11.8 %
NEUTROPHILS ABSOLUTE COUNT: 0.6 10*9/L — ABNORMAL LOW (ref 1.8–7.8)
NEUTROPHILS RELATIVE PERCENT: 38.1 %
NUCLEATED RED BLOOD CELLS: 0 /100{WBCs} (ref <=4)
PLATELET COUNT: 112 10*9/L — ABNORMAL LOW (ref 150–450)
RED BLOOD CELL COUNT: 2.35 10*12/L — ABNORMAL LOW (ref 3.95–5.13)
RED CELL DISTRIBUTION WIDTH: 14.9 % (ref 12.2–15.2)
WBC ADJUSTED: 1.5 10*9/L — ABNORMAL LOW (ref 3.6–11.2)

## 2022-10-10 LAB — COMPREHENSIVE METABOLIC PANEL
ALBUMIN: 3.5 g/dL (ref 3.4–5.0)
ALKALINE PHOSPHATASE: 67 U/L (ref 46–116)
ALT (SGPT): 70 U/L — ABNORMAL HIGH (ref 10–49)
ANION GAP: 6 mmol/L (ref 5–14)
AST (SGOT): 50 U/L — ABNORMAL HIGH (ref <=34)
BILIRUBIN TOTAL: 0.5 mg/dL (ref 0.3–1.2)
BLOOD UREA NITROGEN: 13 mg/dL (ref 9–23)
BUN / CREAT RATIO: 20
CALCIUM: 9.2 mg/dL (ref 8.7–10.4)
CHLORIDE: 109 mmol/L — ABNORMAL HIGH (ref 98–107)
CO2: 26.9 mmol/L (ref 20.0–31.0)
CREATININE: 0.65 mg/dL
EGFR CKD-EPI (2021) FEMALE: >90 mL/min/{1.73_m2} (ref >=60)
GLUCOSE RANDOM: 168 mg/dL (ref 70–179)
POTASSIUM: 3.9 mmol/L (ref 3.4–4.8)
PROTEIN TOTAL: 6.4 g/dL (ref 5.7–8.2)
SODIUM: 142 mmol/L (ref 135–145)

## 2022-10-10 LAB — CMV DNA, QUANTITATIVE, PCR
CMV QUANT LOG(10): 2.17 {Log_IU}/mL — ABNORMAL HIGH (ref <0.00)
CMV QUANT: 147 [IU]/mL — ABNORMAL HIGH (ref <0)
CMV VIRAL LD: DETECTED — AB

## 2022-10-10 MED ADMIN — heparin, porcine (PF) 100 unit/mL injection 500 Units: 500 [IU] | INTRAVENOUS | @ 17:00:00 | Stop: 2022-10-10

## 2022-10-10 NOTE — Unmapped (Signed)
Addended by: Rico Ala on: 10/10/2022 09:06 AM     Modules accepted: Level of Service

## 2022-10-11 MED FILL — SPRYCEL 70 MG TABLET: ORAL | 30 days supply | Qty: 30 | Fill #7

## 2022-10-16 DIAGNOSIS — C9100 Acute lymphoblastic leukemia not having achieved remission: Principal | ICD-10-CM

## 2022-10-16 MED ORDER — ESCITALOPRAM 10 MG TABLET
ORAL_TABLET | Freq: Every day | ORAL | 0 refills | 90 days
Start: 2022-10-16 — End: ?

## 2022-10-16 MED ORDER — CETIRIZINE 10 MG TABLET
ORAL_TABLET | Freq: Every evening | ORAL | 2 refills | 30 days
Start: 2022-10-16 — End: 2023-10-16

## 2022-10-17 MED ORDER — ESCITALOPRAM 10 MG TABLET
ORAL_TABLET | Freq: Every day | ORAL | 0 refills | 90 days | Status: CP
Start: 2022-10-17 — End: ?

## 2022-10-17 MED ORDER — CETIRIZINE 10 MG TABLET
ORAL_TABLET | Freq: Every evening | ORAL | 2 refills | 30 days | Status: CP
Start: 2022-10-17 — End: 2023-10-17

## 2022-10-17 NOTE — Unmapped (Signed)
Refill request for Lexapro and Zyrtec.

## 2022-10-18 ENCOUNTER — Ambulatory Visit: Admit: 2022-10-18 | Discharge: 2022-10-18 | Payer: PRIVATE HEALTH INSURANCE

## 2022-10-18 ENCOUNTER — Ambulatory Visit
Admit: 2022-10-18 | Discharge: 2022-10-18 | Payer: PRIVATE HEALTH INSURANCE | Attending: Adult Health | Primary: Adult Health

## 2022-10-18 ENCOUNTER — Other Ambulatory Visit: Admit: 2022-10-18 | Discharge: 2022-10-18 | Payer: PRIVATE HEALTH INSURANCE

## 2022-10-18 DIAGNOSIS — C9100 Acute lymphoblastic leukemia not having achieved remission: Principal | ICD-10-CM

## 2022-10-18 LAB — COMPREHENSIVE METABOLIC PANEL
ALBUMIN: 3.5 g/dL (ref 3.4–5.0)
ALKALINE PHOSPHATASE: 59 U/L (ref 46–116)
ALT (SGPT): 41 U/L (ref 10–49)
ANION GAP: 5 mmol/L (ref 5–14)
AST (SGOT): 43 U/L — ABNORMAL HIGH (ref <=34)
BILIRUBIN TOTAL: 0.4 mg/dL (ref 0.3–1.2)
BLOOD UREA NITROGEN: 14 mg/dL (ref 9–23)
BUN / CREAT RATIO: 22
CALCIUM: 8.8 mg/dL (ref 8.7–10.4)
CHLORIDE: 110 mmol/L — ABNORMAL HIGH (ref 98–107)
CO2: 28 mmol/L (ref 20.0–31.0)
CREATININE: 0.64 mg/dL
EGFR CKD-EPI (2021) FEMALE: >90 mL/min/{1.73_m2} (ref >=60)
GLUCOSE RANDOM: 114 mg/dL (ref 70–179)
POTASSIUM: 3.9 mmol/L (ref 3.4–4.8)
PROTEIN TOTAL: 6 g/dL (ref 5.7–8.2)
SODIUM: 143 mmol/L (ref 135–145)

## 2022-10-18 LAB — CBC W/ AUTO DIFF
BASOPHILS ABSOLUTE COUNT: 0 10*9/L (ref 0.0–0.1)
BASOPHILS RELATIVE PERCENT: 0.4 %
EOSINOPHILS ABSOLUTE COUNT: 0 10*9/L (ref 0.0–0.5)
EOSINOPHILS RELATIVE PERCENT: 1.6 %
HEMATOCRIT: 25.9 % — ABNORMAL LOW (ref 34.0–44.0)
HEMOGLOBIN: 9.1 g/dL — ABNORMAL LOW (ref 11.3–14.9)
LYMPHOCYTES ABSOLUTE COUNT: 1.1 10*9/L (ref 1.1–3.6)
LYMPHOCYTES RELATIVE PERCENT: 50.5 %
MEAN CORPUSCULAR HEMOGLOBIN CONC: 35.3 g/dL (ref 32.0–36.0)
MEAN CORPUSCULAR HEMOGLOBIN: 41 pg — ABNORMAL HIGH (ref 25.9–32.4)
MEAN CORPUSCULAR VOLUME: 116.1 fL — ABNORMAL HIGH (ref 77.6–95.7)
MEAN PLATELET VOLUME: 7.3 fL (ref 6.8–10.7)
MONOCYTES ABSOLUTE COUNT: 0.6 10*9/L (ref 0.3–0.8)
MONOCYTES RELATIVE PERCENT: 26.6 %
NEUTROPHILS ABSOLUTE COUNT: 0.4 10*9/L — CL (ref 1.8–7.8)
NEUTROPHILS RELATIVE PERCENT: 20.9 %
PLATELET COUNT: 167 10*9/L (ref 150–450)
RED BLOOD CELL COUNT: 2.23 10*12/L — ABNORMAL LOW (ref 3.95–5.13)
RED CELL DISTRIBUTION WIDTH: 16.2 % — ABNORMAL HIGH (ref 12.2–15.2)
WBC ADJUSTED: 2.1 10*9/L — ABNORMAL LOW (ref 3.6–11.2)

## 2022-10-18 LAB — SLIDE REVIEW

## 2022-10-18 LAB — LACTATE DEHYDROGENASE: LACTATE DEHYDROGENASE: 516 U/L — ABNORMAL HIGH (ref 120–246)

## 2022-10-18 MED ORDER — PREDNISONE 50 MG TABLET
ORAL_TABLET | 5 refills | 0 days | Status: CP
Start: 2022-10-18 — End: ?
  Filled 2022-10-18: qty 20, 5d supply, fill #0

## 2022-10-18 MED ADMIN — vinCRIStine (ONCOVIN) 2 mg in sodium chloride (NS) 0.9 % 25 mL IVPB: 2 mg | INTRAVENOUS | @ 18:00:00 | Stop: 2022-10-18

## 2022-10-18 MED ADMIN — sodium chloride (NS) 0.9 % infusion: 100 mL/h | INTRAVENOUS | @ 18:00:00

## 2022-10-18 NOTE — Unmapped (Signed)
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 She reports only one low grade fever in the last week. Eating and drinking well, becoming more active. Notes loose stools while on Augmentin and letermovir but they have improved since stopping Augmentin. She denies skin changes, bleeding, bruising, and SOB. No swelling noted.     Labs: WBC 2.1, ANC 0.4, Hgb 9.1, PLT 167, CMP notable for grade 1 elevations in AST (ALT elevation resolved)  CMV viral load 10/18/22 pending, last 147 on 10/10/22  Total lgG 237 from 11/27; absolute CD4 count 276  BCR-ABL p190 (10/04/22): negative  BMBx (10/04/22): 2% blasts, MRD negative    Oncologic History:  Hematology/Oncology History Overview Note   Referring/Local Oncologist:    Diagnosis:   10/29/2021  Bone marrow, left iliac, aspiration and biopsy  -  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 The majority of the abnormal cells (64/88) contained an additional BCR/ABL1 fusion signal, while 9/88 abnormal cells contained an additional ABL1 and ASS1 signal.       Genetics:   Karyotype/FISH:   RESULTS   Date Value Ref Range Status   10/28/2021   Final    NOTE: This report reflects a combined study from a peripheral blood and a bone marrow core biopsy. Eleven cells from the peripheral blood and nine cells from the bone marrow core biopsy were analyzed. The BCR/ABL1 FISH analysis was performed on the peripheral blood.     Abnormal Karyotype:  47,XX,t(9;22)(q34;q11.2),+der(22)t(9;22)[18]/46,XX[2]    Abnormal FISH:  A BCR/ABL1 interphase FISH assay showed a signal pattern consistent with a BCR::ABL1 rearrangement and the 9;22 translocation in 88% of the 100 cells scored. The majority of the abnormal cells (64/88) contained an additional BCR/ABL1 fusion signal, while 9/88 abnormal cells contained an additional ABL1 and ASS1 signal.           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  MRD by flow insufficient, p190 2/100,000  12/10/2021: Cycle 2 B cycle + rituximab  12/14/21: IT#4  12/17/21: IT#5  01/10/22: Cycle 3 A cycle + rituximab  01/11/22: IT #6  01/24/22: Bmbx - MRD-neg by PCR (p190)   02/17/22: C4 B cycle + ritux  02/18/22: IT#7  03/21/22: C5 A cycle + ritux (4th dose)  03/22/22: IT#8  04/29/22: Bmbx - MRD-neg by PCR (p190)   05/18/22: restart dasatinib 70mg  s/p neutropenia  05/26/22: Start maintenance vincristine 2g, Prednisone 200mg  D1-5   06/02/22: IT #9  06/27/22: C2 Maintenance: vincristine 2g, Prednisone 200mg   D1-5, HOLD dasatinib due to neutropenia  07/04/22: RESTART dasatinib 70mg   07/21/22: IT #10  07/26/22: C3 Maintenance: vincristine 2g, Prednisone 200mg  D1-5, dasatinib 70 mg  08/08/22: IT #11 - prophylaxis completed  08/23/22: C4 Maintenance: vincristine 2g, Prednisone 200mg  D1-5, dasatinib 70 mg  09/20/22: C5 Maintenance: vincristine 2g, Prednisone 200mg  D1-5, dasatinib 70 mg     B-cell acute lymphoblastic leukemia (ALL) (CMS-HCC)   10/29/2021 Initial Diagnosis    B-cell acute lymphoblastic leukemia (ALL) (CMS-HCC)     10/30/2021 - 03/30/2022 Chemotherapy    IP/OP LEUKEMIA GRAAPH-2005 < 60 YO (OP PEGFILGRASTIM ON DAY 7)  [No description for this plan]     01/06/2022 - 04/07/2022 Endocrine/Hormone Therapy    OP LEUPROLIDE (LUPRON) 11.25 MG EVERY 3 MONTHS  Plan Provider: [name], MD     05/26/2022 -  Chemotherapy    OP LEUKEMIA VINCRISTINE  vinCRIStine 2 mg IV on day 1     07/26/2022 Endocrine/Hormone Therapy    OP LEUPROLIDE (LUPRON) 11.25 MG EVERY 3 MONTHS  Plan Provider: [name], MD         Weight and Vitals:  Wt Readings from Last 3 Encounters:   09/26/22 84.7 kg (186 lb 11.7 oz)   09/20/22 84.8 kg (186 lb 15.2 oz)   09/05/22 83.6 kg (184 lb 3.1 oz)     Temp Readings from Last 3 Encounters:   10/04/22 36.9 ??C (98.4 ??F) (Oral)   09/26/22 36.9 ??C (98.4 ??F) (Temporal)   09/20/22 36.8 ??C (98.2 ??F) (Oral)     BP Readings from Last 3 Encounters:   10/04/22 132/81   09/26/22 135/77   09/20/22 126/84     Pulse Readings from Last 3 Encounters:   10/04/22 100   09/26/22 103   09/20/22 86       Pertinent Labs:  No visits with results within 1 Day(s) from this visit.   Latest known visit with results is:   Clinical Support on 10/10/2022   Component Date Value Ref Range Status    Sodium 10/10/2022 142  135 - 145 mmol/L Final    Potassium 10/10/2022 3.9  3.4 - 4.8 mmol/L Final    Chloride 10/10/2022 109 (H)  98 - 107 mmol/L Final    CO2 10/10/2022 26.9  20.0 - 31.0 mmol/L Final    Anion Gap 10/10/2022 6  5 - 14 mmol/L Final    BUN 10/10/2022 13  9 - 23 mg/dL Final    Creatinine 16/05/9603 0.65  0.55 - 1.02 mg/dL Final    BUN/Creatinine Ratio 10/10/2022 20   Final    eGFR CKD-EPI (2021) Female 10/10/2022 >90  >=60 mL/min/1.19m2 Final    eGFR calculated with CKD-EPI 2021 equation in accordance with [employer] and [employer] of Nephrology Task Force recommendations.    Glucose 10/10/2022 168  70 - 179 mg/dL Final    Calcium 54/04/8118 9.2  8.7 - 10.4 mg/dL Final Albumin 14/78/2956 3.5  3.4 - 5.0 g/dL Final    Total Protein 10/10/2022 6.4  5.7 - 8.2 g/dL Final    Total Bilirubin 10/10/2022 0.5  0.3 - 1.2 mg/dL Final    AST 21/30/8657 50 (H)  <=34 U/L Final    ALT 10/10/2022 70 (H)  10 - 49 U/L Final    Alkaline Phosphatase 10/10/2022 67  46 - 116 U/L Final    WBC 10/10/2022 1.5 (L)  3.6 - 11.2 10*9/L Final    RBC 10/10/2022 2.35 (L)  3.95 - 5.13  10*12/L Final    HGB 10/10/2022 9.5 (L)  11.3 - 14.9 g/dL Final    HCT 81/19/1478 27.2 (L)  34.0 - 44.0 % Final    MCV 10/10/2022 116.0 (H)  77.6 - 95.7 fL Final    MCH 10/10/2022 40.3 (H)  25.9 - 32.4 pg Final    MCHC 10/10/2022 34.8  32.0 - 36.0 g/dL Final    RDW 29/56/2130 14.9  12.2 - 15.2 % Final    MPV 10/10/2022 7.3  6.8 - 10.7 fL Final    Platelet 10/10/2022 112 (L)  150 - 450 10*9/L Final    nRBC 10/10/2022 0  <=4 /100 WBCs Final    Neutrophils % 10/10/2022 38.1  % Final    Lymphocytes % 10/10/2022 48.3  % Final    Monocytes % 10/10/2022 11.8  % Final    Eosinophils % 10/10/2022 1.3  % Final    Basophils % 10/10/2022 0.5  % Final    Absolute Neutrophils 10/10/2022 0.6 (L)  1.8 - 7.8 10*9/L Final    Absolute Lymphocytes 10/10/2022 0.7 (L)  1.1 - 3.6 10*9/L Final    Absolute Monocytes 10/10/2022 0.2 (L)  0.3 - 0.8 10*9/L Final    Absolute Eosinophils 10/10/2022 0.0  0.0 - 0.5 10*9/L Final    Absolute Basophils 10/10/2022 0.0  0.0 - 0.1 10*9/L Final    CMV Viral Ld 10/10/2022 Detected (A)  Not Detected Final    CMV Quant 10/10/2022 147 (H)  <0 IU/mL Final    CMV Quant Log(10) 10/10/2022 2.17 (H)  <0.00 log IU/mL Final       Allergies:   Allergies   Allergen Reactions    Erythromycin Hives     Other reaction(s): Not available    Other      Pt cant take ibuprofen due to condition.    Sulfa (Sulfonamide Antibiotics) Anaphylaxis     Other reaction(s): Not available    Azithromycin        Drug Interactions: none identified; dasatinib is a major 3A4 substrate, caution with 3A4 inh/ind; fluconazole max dose with VCR is 200 mg daily; avoid acid reducers with  dasatinib.       Current Medications:  Current Outpatient Medications   Medication Sig Dispense Refill    acetaminophen (TYLENOL 8 HOUR) 650 MG CR tablet Take 2 tablets (1,300 mg total) by mouth every eight (8) hours as needed for pain.      albuterol HFA 90 mcg/actuation inhaler Inhale 2 puffs every six (6) hours as needed for wheezing.      cetirizine (ZYRTEC) 10 MG tablet Take 1 tablet (10 mg total) by mouth nightly. 30 tablet 2    dapsone 100 MG tablet TAKE 1 TABLET(100 MG) BY MOUTH DAILY 30 tablet 11    dasatinib (SPRYCEL) 70 MG tablet Take 1 tablet (70 mg total) by mouth daily. 30 tablet 7    escitalopram oxalate (LEXAPRO) 10 MG tablet Take 1 tablet (10 mg total) by mouth daily. 90 tablet 0    fluconazole (DIFLUCAN) 200 MG tablet Take 1 tablet (200 mg total) by mouth daily. 30 tablet 2    letermovir (PREVYMIS) 480 mg tablet Take 1 tablet (480 mg total) by mouth daily. 28 tablet 2    levalbuterol (XOPENEX) 0.31 mg/3 mL nebulizer solution Inhale 3 mL (0.31 mg total) by nebulization every four (4) hours as needed for wheezing (cough). 45 mL 0    levoFLOXacin (LEVAQUIN) 500 MG tablet Take 1 tablet (500 mg total) by  mouth daily. (Patient not taking: Reported on 10/05/2022) 30 tablet 2    norethindrone (MICRONOR) 0.35 mg tablet Take 1 tablet by mouth daily. (Patient not taking: Reported on 10/05/2022) 84 tablet 3    predniSONE (DELTASONE) 50 MG tablet Take 4 tablets (200 mg total) by mouth once daily on days 1-5 of each cycle. (Patient not taking: Reported on 10/05/2022) 20 tablet 5    tizanidine (ZANAFLEX) 4 MG tablet Take 1 tablet (4 mg total) by mouth nightly. 90 tablet 2    triamcinolone (KENALOG) 0.1 % cream Apply topically two (2) times a day for 14 days. 45 g 0    valACYclovir (VALTREX) 500 MG tablet Take 1 tablet (500 mg total) by mouth daily. 30 tablet 12     No current facility-administered medications for this visit.       Adherence: No barriers identified/no missed doses. 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 Patient finished 5 cycles of hyperCVAD + rituximab + dasatinib before transitioning to VCR/dasatinib/prednisone maintenance. She is currently on dasatinib 70 mg daily, previously unable to titrate to normal maintenance dosing due to neutropenia (though may have been CMV and/or valcyte related). She is doing well but counts continue to fluctuate, now with ANC drop to 0.4. She was transitioned to letermovir on 10/05/22 by ICID to avoid valcyte-induced myelosuppression upon previous check 10/10/22 CMV viral load 147 up from undetectable and today's is pending. The etiology of her myelosuppression remains unclear; with transition off Valcyte, it may be secondary to dasatinib. Most recent BMBx (10/04/22) showed MRD negative disease. Will plan to HOLD dasatinib at this time to assess counts with holding drug. If her counts improve off of dasatinib, we can consider adjustment to TKI therapy.     Plan:   1) Ph+ ALL  - Continue vincristine IV on day 1 of 28 day cycle (due today)  - Continue prednisone 200 mg (4 tablets) days 1-5 of 28 day cycle (will restart today)  - HOLD dasatinib 70 mg daily   - #12 of 12 IT therapy now complete  - Continue weekly labs for both CBC/diff and for CMV levels, will follow w/ reminders   - BCR-ABL continues to be negative (most recent 10/04/22)  - RTC in 4 weeks for C7    2) Infection   -RESUME levofloxacin 500 mg daily and fluconazole 200 mg daily for ANC <0.5   -Continue dapsone 100 mg PO daily for PJP ppx  -Continue letermovir 480 mg daily per ICID (started 10/05/22)   -Continue valacyclovir 500 mg daily (will hold if needing to resume valganciclovir)   -Continue IVIG - dose due today and continue every 4 weeks (at [place] moving forward due to insurance, can plan for next week)     3) Supportive Care  - Continue Tylenol prn for pain. Check temperature before Tylenol dose and avoid taking NSAIDs.  - Can take Dulcolax prn for constipation  - Can take famotidine if having acid reflex from steroids. Space famotidine 2 hours after dasatinib dose.   - Most recent dose of Lupron on 07/26/22. Discussed this can be final dose, than consider alternative contraception. Will continue micronor as bone/CV protection and can also trial it for continued use for contraception and menses suppression once off lupron.  - Following with gynecology    F/u:  Future Appointments   Date Time Provider Department Center   10/18/2022  9:15 AM ADULT ONC LAB [facility]   10/18/2022 10:00 AM ONCHEM LEUKEMIA PHARMACIST [id number]   10/18/2022 10:30 AM Lenon Ahmadi, AGNP [id number]   10/18/2022 11:45 AM ONCDEV CHAIR 54 [id number]   10/24/2022  1:00 PM ONCINF HMOB LABS [facility]   10/31/2022 12:00 PM ONCINF HMOB LABS [facility]   11/04/2022  1:45 PM Idelia Salm, MD [facility]   11/09/2022 11:30 AM Bill Salinas, MD [id number]   12/13/2022  8:30 AM Ortencia Kick, MD [facility]       I spent 20 minutes in direct patient care    Arville Lime, PharmD  PGY2 Oncology Pharmacy Resident

## 2022-10-18 NOTE — Unmapped (Addendum)
You are doing well. Once the [facility] is resulted the pharmacy team will come in and give you further instructions. But plan to receive vincristine today and prednisone Days 1-5.    The IVIG will be scheduled at [facility].    Continue the weekly lab checks.    Our team will see you back in 4 weeks for the next cycle.

## 2022-10-18 NOTE — Unmapped (Signed)
Patient arrived to chair 42.  No complaints noted.  Access of port intact with positive blood return.  Labs resulted within parameters for tx. Per team, pt not to get IVIG at this clinic. Patient completed and tolerated treatment.  AVS declined, port deaccessed and patient discharged to home.

## 2022-10-18 NOTE — Unmapped (Signed)
[facility] Leukemia Clinic Follow Up Visit Note     [name]  Patient Age: 47 y.o.  Encounter Date: 10/18/2022    Primary Care Provider:  [name], MD    Referring Physician:  [name], MD  [address]  [place],  [place] [address]    Cancer Diagnosis: Ph+ B-ALL; Initial Dx 10/29/2021  Cancer status: CR1, BCR/ABL p190 negative in marrow  Treatment Regimen: First Line, Maintenance (Dasatinib, vincristine, prednisone) s/p truncated course of GRAAPH-2005, HyperCVAD  Treatment Goal: Curative  Comorbidities: HTN, anxiety, hemorrhoids with anal fissures  Transplant: referral deferred in CR1    Assessment/Plan:  Patricia [name] is a 47 y.o. female with past medical history of chronic right shoulder and back pain, hypertension, anxiety, and recently diagnosed Ph+ ALL. 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  Given her deep molecular remission and her intolerance of hyperCVAD, we are transitioned to maintenance chemotherapy.     She is now s/p 5 Cycle Maintenance dasatinib + vincristine + prednisone. Bone marrow biopsy demonstrates a deep remission with undetectable BCR/ABL.Marland Kitchen She feels well. ANC is lower, 0.4 despite switching valcyte to letermovir. Will hold dasatinib for now. Proceed with vincristine and prednisone.  CMV level is pending.  Continue monthly IVIG.    Ph+ALL, in remission: On GRAAPH 2005 + dasatinib. Completed 12/12 planned IT treatments for CNS ppx.  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 09/2022 restarted Valcyte with the goal of controlling CMV and limiting neutropenia  - continue letermovir  - weekly CMV levels    Hypogammaglobulinemia:   - Monthly IVIG started 07/26/22 - now at [facility] due to insurance coverage    Hemorrhoids, Anal fissure: persistent but stable, per patient 10/18/2022  - supportive care at home    Pancytopenia due to chemotherapy  - ANC 0.4, platelets 167K 10/18/2022  - supportive care as detailed below when in nadir    Menses suppression and birth control: Lupron  - dose 07/26/22    Will need birth control while on chemo, including when on only TKI -     Psychosocial distress: She reports a moderate level of anxiety regarding the management of the above.   - Counseling given   - Consider ref to comprehensive cancer support program     Patient-centered care/Shared decision-making:   We discussed the plan above at length. The patient and her husband actively contributed to the conversation. Specifically, her most important outcomes are:   Prolonging overall survival and Maintaining her overall functional activity     Supportive Care Needs: We recommend based on the patient???s underlying diagnosis and treatment history the following supportive care:    1. Antimicrobial prophylaxis:    Viral - valacyclovir and letermovir  Bacterial: Levofloxacin when ANC <0.5  Fungal: Fluconazole 200mg  when ANC <0.5  PJP: dapsone 100mg  daily    2. Blood product support:  Leukoreduced blood products are required.  Irradiated blood products are preferred, but in case of urgent transfusion  needs non-irradiated blood products may be used: 2 units for Hg <=8.0.     Coordination of care:   - Continue maintenance q4 weeks   - weekly lab checks      [name], AGPCNP-BC  Nurse Practitioner  Hematology/Oncology  [facility]    Patricia Aloe, MD was available  Leukemia Program  Division of Hematology  [facility]      Nurse Navigator (non-clinical trial patients): Elicia Lamp, RN        Tel. [phone]       Fax. [phone]  Toll-free appointments: [phone]  Scheduling assistance: [phone]  After hours/weekends: [phone] (ask for adult hematology/oncology on-call)      History of Present Illness:   Patricia [name] is a 47 y.o. female with past medical history noted as above who presents for follow up Ph+ ALL.      Her social history is notable for being a Runner, broadcasting/film/video.  She was recently married. She lives with her husband.    Interim history  Since her last visit:  Has been feeling great.  Has been walking more - 3,000 steps/day generally.  Pressure washed back patio.  Feet are a little sore but doing okay.  Mild tingling/sensitivity in thumbs, toes.    Diarrhea  - 3/15  - thought it was a combination of the Augmentin and the antiviral  - stool is more formed. 2 BMs yesterday. One BM  - no fevers    No nausea/vomiting.      Oncology History is as below:   Hematology/Oncology History Overview Note   Referring/Local Oncologist:    Diagnosis:   10/29/2021  Bone marrow, left iliac, aspiration and biopsy  -  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 The majority of the abnormal cells (64/88) contained an additional BCR/ABL1 fusion signal, while 9/88 abnormal cells contained an additional ABL1 and ASS1 signal.       Genetics:   Karyotype/FISH:   RESULTS   Date Value Ref Range Status   10/28/2021   Final    NOTE: This report reflects a combined study from a peripheral blood and a bone marrow core biopsy. Eleven cells from the peripheral blood and nine cells from the bone marrow core biopsy were analyzed. The BCR/ABL1 FISH analysis was performed on the peripheral blood.     Abnormal Karyotype:  47,XX,t(9;22)(q34;q11.2),+der(22)t(9;22)[18]/46,XX[2]    Abnormal FISH:  A BCR/ABL1 interphase FISH assay showed a signal pattern consistent with a BCR::ABL1 rearrangement and the 9;22 translocation in 88% of the 100 cells scored. The majority of the abnormal cells (64/88) contained an additional BCR/ABL1 fusion signal, while 9/88 abnormal cells contained an additional ABL1 and ASS1 signal.           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  MRD by flow insufficient, p190 2/100,000  12/10/2021: Cycle 2 B cycle +  rituximab  12/14/21: IT#4  12/17/21: IT#5  01/10/22: Cycle 3 A cycle + rituximab  01/11/22: IT #6  01/24/22: Bmbx - MRD-neg by PCR (p190)   02/17/22: C4 B cycle + ritux  02/18/22: IT#7  03/21/22: C5 A cycle + ritux (4th dose)  03/22/22: IT#8  04/29/22: Bmbx - MRD-neg by PCR (p190)   05/18/22: restart dasatinib 70mg  s/p neutropenia  05/26/22: Start maintenance vincristine 2g, Prednisone 200mg  D1-5   06/02/22: IT #9  06/27/22: C2 Maintenance: vincristine 2g, Prednisone 200mg  D1-5, HOLD dasatinib due to neutropenia  07/04/22: RESTART dasatinib 70mg   07/21/22: IT #10  07/26/22: C3 Maintenance: vincristine 2g, Prednisone 200mg  D1-5, dasatinib 70 mg  08/08/22: IT #11 - prophylaxis completed  08/23/22: C4 Maintenance: vincristine 2g, Prednisone 200mg  D1-5, dasatinib 70 mg  09/20/22: C5 Maintenance: vincristine 2g, Prednisone 200mg  D1-5, dasatinib 70 mg  10/04/22: Bone marrow biopsy - continued remission, MRD and BCR/ABL p190 negative  10/18/22: C6 Maintenance. ANC 0.4, HOLD dasatinib, continue vincristine 2g and Prednisone 200mg  D1-5           Review of Systems:   ROS reviewed and negative except as noted in H and P     Allergies:  Allergies Allergen Reactions    Erythromycin Hives     Other reaction(s): Not available    Other      Pt cant take ibuprofen due to condition.    Sulfa (Sulfonamide Antibiotics) Anaphylaxis     Other reaction(s): Not available    Azithromycin        Medications:     Current Outpatient Medications:     acetaminophen (TYLENOL 8 HOUR) 650 MG CR tablet, Take 2 tablets (1,300 mg total) by mouth every eight (8) hours as needed for pain., Disp: , Rfl:     albuterol HFA 90 mcg/actuation inhaler, Inhale 2 puffs every six (6) hours as needed for wheezing., Disp: , Rfl:     cetirizine (ZYRTEC) 10 MG tablet, Take 1 tablet (10 mg total) by mouth nightly., Disp: 30 tablet, Rfl: 2    dapsone 100 MG tablet, TAKE 1 TABLET(100 MG) BY MOUTH DAILY, Disp: 30 tablet, Rfl: 11    dasatinib (SPRYCEL) 70 MG tablet, Take 1 tablet (70 mg total) by mouth daily., Disp: 30 tablet, Rfl: 7    escitalopram oxalate (LEXAPRO) 10 MG tablet, Take 1 tablet (10 mg total) by mouth daily., Disp: 90 tablet, Rfl: 0    fluconazole (DIFLUCAN) 200 MG tablet, Take 1 tablet (200 mg total) by mouth daily., Disp: 30 tablet, Rfl: 2    letermovir (PREVYMIS) 480 mg tablet, Take 1 tablet (480 mg total) by mouth daily., Disp: 28 tablet, Rfl: 2    levalbuterol (XOPENEX) 0.31 mg/3 mL nebulizer solution, Inhale 3 mL (0.31 mg total) by nebulization every four (4) hours as needed for wheezing (cough)., Disp: 45 mL, Rfl: 0    levoFLOXacin (LEVAQUIN) 500 MG tablet, Take 1 tablet (500 mg total) by mouth daily., Disp: 30 tablet, Rfl: 2    norethindrone (MICRONOR) 0.35 mg tablet, Take 1 tablet by mouth daily. (Patient not taking: Reported on 10/18/2022), Disp: 84 tablet, Rfl: 3    predniSONE (DELTASONE) 50 MG tablet, Take 4 tablets (200 mg total) by mouth once daily on days 1-5 of each cycle., Disp: 20 tablet, Rfl: 5    tizanidine (ZANAFLEX) 4 MG tablet, Take 1 tablet (4 mg total) by mouth nightly., Disp: 90 tablet, Rfl: 2    triamcinolone (KENALOG) 0.1 % cream, Apply topically two (2) times  a day for 14 days., Disp: 45 g, Rfl: 0    valACYclovir (VALTREX) 500 MG tablet, Take 1 tablet (500 mg total) by mouth daily., Disp: 30 tablet, Rfl: 12  No current facility-administered medications for this visit.    Facility-Administered Medications Ordered in Other Visits:     acetaminophen (TYLENOL) tablet 650 mg, 650 mg, Oral, Once, Dellis Anes, Ursula Alert, AGNP    dexAMETHasone (DECADRON) 4 mg/mL injection 20 mg, 20 mg, Intravenous, Once PRN, [name], MD    dextrose 5 % infusion, 30 mL/hr, Intravenous, Continuous, Lenon Ahmadi, AGNP    dextrose 5 % infusion, 60 mL/hr, Intravenous, Continuous PRN, Dellis Anes, Ursula Alert, AGNP    diphenhydrAMINE (BENADRYL) capsule/tablet 25 mg, 25 mg, Oral, Once, Lenon Ahmadi, AGNP    diphenhydrAMINE (BENADRYL) injection 25 mg, 25 mg, Intravenous, Once PRN, [name], MD    EPINEPHrine [facility]) injection 0.3 mg, 0.3 mg, Intramuscular, Once PRN, [name], MD    famotidine (PF) (PEPCID) injection 20 mg, 20 mg, Intravenous, Once PRN, [name], MD    immun glob G(IgG)-pro-IgA 0-50 (PRIVIGEN) 10 % intravenous solution 25 g, 0.4 g/kg (Ideal), Intravenous, Once, Dellis Anes, Ursula Alert, AGNP    methylPREDNISolone sodium succinate (PF) (SOLU-Medrol) injection 125 mg, 125 mg, Intravenous, Once PRN, [name], MD    OKAY TO SEND MEDICATION/CHEMOTHERAPY TO OUTPATIENT UNIT, , Other, Once, [name], MD    prochlorperazine (COMPAZINE) injection 10 mg, 10 mg, Intravenous, Q6H PRN, [name], MD    prochlorperazine (COMPAZINE) tablet 10 mg, 10 mg, Oral, Q6H PRN, [name], MD    sodium chloride (NS) 0.9 % infusion, 100 mL/hr, Intravenous, Continuous, [name], MD, Stopped at 10/18/22 1430    sodium chloride (NS) 0.9 % infusion, 20 mL/hr, Intravenous, Continuous PRN, [name], MD    sodium chloride 0.9% (NS) bolus 1,000 mL, 1,000 mL, Intravenous, Once PRN, [name], MD    Medical History:  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    EBV infection        Social History:  Social History     Social History Narrative    Not on file       Family History:  No family history on file.    Objective:   There were no vitals taken for this visit.    Physical Exam:  GENERAL: well-appearing  woman   HEART: Normal color, not excessive pallor.  CHEST/LUNG: Normal work of breathing. No dyspnea with conversation. No crackles. Regular rate/rhythm  ABDOMEN: No obvious distention.    EXTREMITIES: No edema, cyanosis or clubbing.   SKIN: no rashes noted  NEURO EXAM: Grossly intact.       Test Results:  10/18/2022 CBC/d and CMP reviewed with patient

## 2022-10-18 NOTE — Unmapped (Signed)
Lab on 10/18/2022   Component Date Value Ref Range Status    LDH 10/18/2022 516 (H)  120 - 246 U/L Final    Sodium 10/18/2022 143  135 - 145 mmol/L Final    Potassium 10/18/2022 3.9  3.4 - 4.8 mmol/L Final    Chloride 10/18/2022 110 (H)  98 - 107 mmol/L Final    CO2 10/18/2022 28.0  20.0 - 31.0 mmol/L Final    Anion Gap 10/18/2022 5  5 - 14 mmol/L Final    BUN 10/18/2022 14  9 - 23 mg/dL Final    Creatinine 16/05/9603 0.64  0.55 - 1.02 mg/dL Final    BUN/Creatinine Ratio 10/18/2022 22   Final    eGFR CKD-EPI (2021) Female 10/18/2022 >90  >=60 mL/min/1.78m2 Final    eGFR calculated with CKD-EPI 2021 equation in accordance with [employer] and [employer] of Nephrology Task Force recommendations.    Glucose 10/18/2022 114  70 - 179 mg/dL Final    Calcium 54/04/8118 8.8  8.7 - 10.4 mg/dL Final    Albumin 14/78/2956 3.5  3.4 - 5.0 g/dL Final    Total Protein 10/18/2022 6.0  5.7 - 8.2 g/dL Final    Total Bilirubin 10/18/2022 0.4  0.3 - 1.2 mg/dL Final    AST 21/30/8657 43 (H)  <=34 U/L Final    ALT 10/18/2022 41  10 - 49 U/L Final    Alkaline Phosphatase 10/18/2022 59  46 - 116 U/L Final    WBC 10/18/2022 2.1 (L)  3.6 - 11.2 10*9/L Final    RBC 10/18/2022 2.23 (L)  3.95 - 5.13 10*12/L Final    HGB 10/18/2022 9.1 (L)  11.3 - 14.9 g/dL Final    HCT 84/69/6295 25.9 (L)  34.0 - 44.0 % Final    MCV 10/18/2022 116.1 (H)  77.6 - 95.7 fL Final    MCH 10/18/2022 41.0 (H)  25.9 - 32.4 pg Final    MCHC 10/18/2022 35.3  32.0 - 36.0 g/dL Final    RDW 28/41/3244 16.2 (H)  12.2 - 15.2 % Final    MPV 10/18/2022 7.3  6.8 - 10.7 fL Final    Platelet 10/18/2022 167  150 - 450 10*9/L Final    Neutrophils % 10/18/2022 20.9  % Final    Lymphocytes % 10/18/2022 50.5  % Final    Monocytes % 10/18/2022 26.6  % Final    Eosinophils % 10/18/2022 1.6  % Final    Basophils % 10/18/2022 0.4  % Final    Absolute Neutrophils 10/18/2022 0.4 (LL)  1.8 - 7.8 10*9/L Final    Absolute Lymphocytes 10/18/2022 1.1  1.1 - 3.6 10*9/L Final    Absolute Monocytes 10/18/2022 0.6  0.3 - 0.8 10*9/L Final    Absolute Eosinophils 10/18/2022 0.0  0.0 - 0.5 10*9/L Final    Absolute Basophils 10/18/2022 0.0  0.0 - 0.1 10*9/L Final    Macrocytosis 10/18/2022 Moderate (A)  Not Present Final    Anisocytosis 10/18/2022 Slight (A)  Not Present Final    Smear Review Comments 10/18/2022 See Comment (A)  Undefined Final    [id number] Slide reviewed.     Polychromasia 10/18/2022 Moderate (A)  Not Present Final    Basophilic Stippling 10/18/2022 Present (A)  Not Present Final

## 2022-10-19 LAB — CMV DNA, QUANTITATIVE, PCR: CMV VIRAL LD: NOT DETECTED

## 2022-10-20 NOTE — Unmapped (Signed)
Outpatient Oncology Social Work   Initial Clinical Assessment      Referral: Elicia Lamp, RN    Patient Status: The patient is a 47 yo female with Lymphoblastic leukemia. SW was asked to reach out regarding the patient needing information about Medicaid CAP Program. SW spoke to the patient via telephone. The patient stated that the information needed was not for her but her in-laws. SW advised her to have her husband call his parents insurance to see what they qualify for. SW also provided the patient with Home Health source in [place].     Financial/Insurance: STATE HE[employer] PLAN     Psychosocial/Coping: The patient stated that the information needed was for her husband's parents. SW informed the patient that if she should need support for herself, she has SW information and will call with any questions/concerns. SW will remain available to provide additional support, information, and resources as needed.          Lowery Paullin H. Mayford Knife, MSW, LCSW  Oncology Outpatient Social Worker  [phone]

## 2022-10-24 ENCOUNTER — Institutional Professional Consult (permissible substitution): Admit: 2022-10-24 | Discharge: 2022-10-25 | Payer: PRIVATE HEALTH INSURANCE

## 2022-10-24 DIAGNOSIS — C9100 Acute lymphoblastic leukemia not having achieved remission: Principal | ICD-10-CM

## 2022-10-24 LAB — COMPREHENSIVE METABOLIC PANEL
ALBUMIN: 4 g/dL (ref 3.4–5.0)
ALKALINE PHOSPHATASE: 60 U/L (ref 46–116)
ALT (SGPT): 51 U/L — ABNORMAL HIGH (ref 10–49)
ANION GAP: 9 mmol/L (ref 5–14)
AST (SGOT): 42 U/L — ABNORMAL HIGH (ref <=34)
BILIRUBIN TOTAL: 0.6 mg/dL (ref 0.3–1.2)
BLOOD UREA NITROGEN: 25 mg/dL — ABNORMAL HIGH (ref 9–23)
BUN / CREAT RATIO: 32
CALCIUM: 9.8 mg/dL (ref 8.7–10.4)
CHLORIDE: 107 mmol/L (ref 98–107)
CO2: 29.2 mmol/L (ref 20.0–31.0)
CREATININE: 0.78 mg/dL
EGFR CKD-EPI (2021) FEMALE: >90 mL/min/{1.73_m2} (ref >=60)
GLUCOSE RANDOM: 148 mg/dL (ref 70–179)
POTASSIUM: 3 mmol/L — ABNORMAL LOW (ref 3.4–4.8)
PROTEIN TOTAL: 6.4 g/dL (ref 5.7–8.2)
SODIUM: 145 mmol/L (ref 135–145)

## 2022-10-24 LAB — CBC W/ AUTO DIFF
BASOPHILS ABSOLUTE COUNT: 0 10*9/L (ref 0.0–0.1)
BASOPHILS RELATIVE PERCENT: 0.3 %
EOSINOPHILS ABSOLUTE COUNT: 0 10*9/L (ref 0.0–0.5)
EOSINOPHILS RELATIVE PERCENT: 0.3 %
HEMATOCRIT: 29.5 % — ABNORMAL LOW (ref 34.0–44.0)
HEMOGLOBIN: 10.7 g/dL — ABNORMAL LOW (ref 11.3–14.9)
LYMPHOCYTES ABSOLUTE COUNT: 0.9 10*9/L — ABNORMAL LOW (ref 1.1–3.6)
LYMPHOCYTES RELATIVE PERCENT: 22.2 %
MEAN CORPUSCULAR HEMOGLOBIN CONC: 36.2 g/dL — ABNORMAL HIGH (ref 32.0–36.0)
MEAN CORPUSCULAR HEMOGLOBIN: 41.3 pg — ABNORMAL HIGH (ref 25.9–32.4)
MEAN CORPUSCULAR VOLUME: 114.1 fL — ABNORMAL HIGH (ref 77.6–95.7)
MEAN PLATELET VOLUME: 8.3 fL (ref 6.8–10.7)
MONOCYTES ABSOLUTE COUNT: 0.9 10*9/L — ABNORMAL HIGH (ref 0.3–0.8)
MONOCYTES RELATIVE PERCENT: 21.4 %
NEUTROPHILS ABSOLUTE COUNT: 2.3 10*9/L (ref 1.8–7.8)
NEUTROPHILS RELATIVE PERCENT: 55.8 %
NUCLEATED RED BLOOD CELLS: 3 /100{WBCs} (ref <=4)
PLATELET COUNT: 197 10*9/L (ref 150–450)
RED BLOOD CELL COUNT: 2.59 10*12/L — ABNORMAL LOW (ref 3.95–5.13)
RED CELL DISTRIBUTION WIDTH: 14.8 % (ref 12.2–15.2)
WBC ADJUSTED: 4.1 10*9/L (ref 3.6–11.2)

## 2022-10-24 LAB — LACTATE DEHYDROGENASE: LACTATE DEHYDROGENASE: 652 U/L — ABNORMAL HIGH (ref 120–246)

## 2022-10-25 LAB — CMV DNA, QUANTITATIVE, PCR: CMV VIRAL LD: NOT DETECTED

## 2022-10-26 ENCOUNTER — Ambulatory Visit: Admit: 2022-10-26 | Discharge: 2022-10-27 | Payer: PRIVATE HEALTH INSURANCE

## 2022-10-26 DIAGNOSIS — C9100 Acute lymphoblastic leukemia not having achieved remission: Principal | ICD-10-CM

## 2022-10-26 MED ORDER — PONATINIB 15 MG TABLET
ORAL_TABLET | Freq: Every day | ORAL | 5 refills | 30 days | Status: CP
Start: 2022-10-26 — End: ?
  Filled 2022-12-05: qty 30, 30d supply, fill #0

## 2022-10-26 MED ADMIN — diphenhydrAMINE (BENADRYL) capsule/tablet 25 mg: 25 mg | ORAL | @ 13:00:00 | Stop: 2022-10-26

## 2022-10-26 MED ADMIN — immun glob G(IgG)-pro-IgA 0-50 (PRIVIGEN) 10 % intravenous solution 30 g: .4 g/kg | INTRAVENOUS | @ 13:00:00 | Stop: 2022-10-26

## 2022-10-26 MED ADMIN — heparin, porcine (PF) 100 unit/mL injection 500 Units: 500 [IU] | INTRAVENOUS | @ 15:00:00 | Stop: 2022-10-26

## 2022-10-26 MED ADMIN — acetaminophen (TYLENOL) tablet 650 mg: 650 mg | ORAL | @ 13:00:00 | Stop: 2022-10-26

## 2022-10-26 MED FILL — PREVYMIS 480 MG TABLET: ORAL | 28 days supply | Qty: 28 | Fill #0

## 2022-10-26 NOTE — Unmapped (Signed)
Pt presents for IVIG.  VSS, pt weight is 85.87 kg.  IV placed, premeds administered.  Pt aware of potential reaction/side effects, call bell within reach.    0902 IVIG 30 g started, to infuse at the following rates:    26 ml/hr for 15 min  86 ml/hr for 30 min  172 ml/hr for 30 min  412 ml/hr until complete.    1042 IVIG complete.  Pt tolerated without complication, VSS.  IV flushed per policy and d/c'd, gauze and coban applied.  Pt left clinic in no acute distress.

## 2022-10-27 DIAGNOSIS — C9100 Acute lymphoblastic leukemia not having achieved remission: Principal | ICD-10-CM

## 2022-10-31 ENCOUNTER — Institutional Professional Consult (permissible substitution): Admit: 2022-10-31 | Discharge: 2022-11-01 | Payer: PRIVATE HEALTH INSURANCE

## 2022-10-31 DIAGNOSIS — C9100 Acute lymphoblastic leukemia not having achieved remission: Principal | ICD-10-CM

## 2022-10-31 LAB — CBC W/ AUTO DIFF
BASOPHILS ABSOLUTE COUNT: 0 10*9/L (ref 0.0–0.1)
BASOPHILS RELATIVE PERCENT: 0.9 %
EOSINOPHILS ABSOLUTE COUNT: 0 10*9/L (ref 0.0–0.5)
EOSINOPHILS RELATIVE PERCENT: 1.4 %
HEMATOCRIT: 30.9 % — ABNORMAL LOW (ref 34.0–44.0)
HEMOGLOBIN: 10.7 g/dL — ABNORMAL LOW (ref 11.3–14.9)
LYMPHOCYTES ABSOLUTE COUNT: 0.8 10*9/L — ABNORMAL LOW (ref 1.1–3.6)
LYMPHOCYTES RELATIVE PERCENT: 50.4 %
MEAN CORPUSCULAR HEMOGLOBIN CONC: 34.8 g/dL (ref 32.0–36.0)
MEAN CORPUSCULAR HEMOGLOBIN: 39.8 pg — ABNORMAL HIGH (ref 25.9–32.4)
MEAN CORPUSCULAR VOLUME: 114.3 fL — ABNORMAL HIGH (ref 77.6–95.7)
MEAN PLATELET VOLUME: 9.3 fL (ref 6.8–10.7)
MONOCYTES ABSOLUTE COUNT: 0.3 10*9/L (ref 0.3–0.8)
MONOCYTES RELATIVE PERCENT: 16.6 %
NEUTROPHILS ABSOLUTE COUNT: 0.5 10*9/L — ABNORMAL LOW (ref 1.8–7.8)
NEUTROPHILS RELATIVE PERCENT: 30.7 %
NUCLEATED RED BLOOD CELLS: 1 /100{WBCs} (ref <=4)
PLATELET COUNT: 94 10*9/L — ABNORMAL LOW (ref 150–450)
RED BLOOD CELL COUNT: 2.7 10*12/L — ABNORMAL LOW (ref 3.95–5.13)
RED CELL DISTRIBUTION WIDTH: 15.7 % — ABNORMAL HIGH (ref 12.2–15.2)
WBC ADJUSTED: 1.5 10*9/L — ABNORMAL LOW (ref 3.6–11.2)

## 2022-10-31 LAB — SLIDE REVIEW

## 2022-10-31 LAB — LACTATE DEHYDROGENASE: LACTATE DEHYDROGENASE: 456 U/L — ABNORMAL HIGH (ref 120–246)

## 2022-10-31 LAB — COMPREHENSIVE METABOLIC PANEL
ALBUMIN: 3.6 g/dL (ref 3.4–5.0)
ALKALINE PHOSPHATASE: 59 U/L (ref 46–116)
ALT (SGPT): 82 U/L — ABNORMAL HIGH (ref 10–49)
ANION GAP: 5 mmol/L (ref 5–14)
AST (SGOT): 57 U/L — ABNORMAL HIGH (ref <=34)
BILIRUBIN TOTAL: 0.6 mg/dL (ref 0.3–1.2)
BLOOD UREA NITROGEN: 14 mg/dL (ref 9–23)
BUN / CREAT RATIO: 21
CALCIUM: 9 mg/dL (ref 8.7–10.4)
CHLORIDE: 110 mmol/L — ABNORMAL HIGH (ref 98–107)
CO2: 27.5 mmol/L (ref 20.0–31.0)
CREATININE: 0.67 mg/dL
EGFR CKD-EPI (2021) FEMALE: >90 mL/min/{1.73_m2} (ref >=60)
GLUCOSE RANDOM: 148 mg/dL (ref 70–179)
POTASSIUM: 3.8 mmol/L (ref 3.4–4.8)
PROTEIN TOTAL: 6.5 g/dL (ref 5.7–8.2)
SODIUM: 142 mmol/L (ref 135–145)

## 2022-10-31 LAB — CMV DNA, QUANTITATIVE, PCR: CMV VIRAL LD: NOT DETECTED

## 2022-10-31 MED ADMIN — heparin, porcine (PF) 100 unit/mL injection 500 Units: 500 [IU] | INTRAVENOUS | @ 16:00:00 | Stop: 2022-10-31

## 2022-10-31 NOTE — Unmapped (Signed)
Clinical Support on 10/31/2022   Component Date Value Ref Range Status    Sodium 10/31/2022 142  135 - 145 mmol/L Final    Potassium 10/31/2022 3.8  3.4 - 4.8 mmol/L Final    Chloride 10/31/2022 110 (H)  98 - 107 mmol/L Final    CO2 10/31/2022 27.5  20.0 - 31.0 mmol/L Final    Anion Gap 10/31/2022 5  5 - 14 mmol/L Final    BUN 10/31/2022 14  9 - 23 mg/dL Final    Creatinine 16/05/9603 0.67  0.55 - 1.02 mg/dL Final    BUN/Creatinine Ratio 10/31/2022 21   Final    eGFR CKD-EPI (2021) Female 10/31/2022 >90  >=60 mL/min/1.9m2 Final    eGFR calculated with CKD-EPI 2021 equation in accordance with [employer] and [employer] of Nephrology Task Force recommendations.    Glucose 10/31/2022 148  70 - 179 mg/dL Final    Calcium 54/04/8118 9.0  8.7 - 10.4 mg/dL Final    Albumin 14/78/2956 3.6  3.4 - 5.0 g/dL Final    Total Protein 10/31/2022 6.5  5.7 - 8.2 g/dL Final    Total Bilirubin 10/31/2022 0.6  0.3 - 1.2 mg/dL Final    AST 21/30/8657 57 (H)  <=34 U/L Final    ALT 10/31/2022 82 (H)  10 - 49 U/L Final    Alkaline Phosphatase 10/31/2022 59  46 - 116 U/L Final    LDH 10/31/2022 456 (H)  120 - 246 U/L Final    WBC 10/31/2022 1.5 (L)  3.6 - 11.2 10*9/L Final    RBC 10/31/2022 2.70 (L)  3.95 - 5.13 10*12/L Final    HGB 10/31/2022 10.7 (L)  11.3 - 14.9 g/dL Final    HCT 84/69/6295 30.9 (L)  34.0 - 44.0 % Final    MCV 10/31/2022 114.3 (H)  77.6 - 95.7 fL Final    MCH 10/31/2022 39.8 (H)  25.9 - 32.4 pg Final    MCHC 10/31/2022 34.8  32.0 - 36.0 g/dL Final    RDW 28/41/3244 15.7 (H)  12.2 - 15.2 % Final    MPV 10/31/2022 9.3  6.8 - 10.7 fL Final    Platelet 10/31/2022 94 (L)  150 - 450 10*9/L Final    nRBC 10/31/2022 1  <=4 /100 WBCs Final    Neutrophils % 10/31/2022 30.7  % Final    Lymphocytes % 10/31/2022 50.4  % Final    Monocytes % 10/31/2022 16.6  % Final    Eosinophils % 10/31/2022 1.4  % Final    Basophils % 10/31/2022 0.9  % Final    Absolute Neutrophils 10/31/2022 0.5 (L)  1.8 - 7.8 10*9/L Final Absolute Lymphocytes 10/31/2022 0.8 (L)  1.1 - 3.6 10*9/L Final    Absolute Monocytes 10/31/2022 0.3  0.3 - 0.8 10*9/L Final    Absolute Eosinophils 10/31/2022 0.0  0.0 - 0.5 10*9/L Final    Absolute Basophils 10/31/2022 0.0  0.0 - 0.1 10*9/L Final    Macrocytosis 10/31/2022 Moderate (A)  Not Present Final    Smear Review Comments 10/31/2022 See Comment (A)  Undefined Final    Smear reviewed.     Polychromasia 10/31/2022 Slight (A)  Not Present Final    Basophilic Stippling 10/31/2022 Present (A)  Not Present Final

## 2022-10-31 NOTE — Unmapped (Signed)
[facility] has been contacted in regards to their refill of Sprycel. At this time, they have declined refill due to "medication being on hold. Refill assessment call date has been updated per the patient's request.

## 2022-11-01 NOTE — Unmapped (Signed)
[facility] Specialty Medication Onboarding    Specialty Medication: Iclusig  Prior Authorization: Approved   Financial Assistance: No - copay  <$25  Final Copay/Day Supply: $0 / 30    Insurance Restrictions: None     Notes to Pharmacist:     The triage team has completed the benefits investigation and has determined that the patient is able to fill this medication at [facility]. Please contact the patient to complete the onboarding or follow up with the prescribing physician as needed.

## 2022-11-04 NOTE — Unmapped (Signed)
Confirmed appts in [facility] for labs and possible transfusion on 4.8

## 2022-11-07 ENCOUNTER — Ambulatory Visit: Admit: 2022-11-07 | Discharge: 2022-11-07 | Payer: PRIVATE HEALTH INSURANCE

## 2022-11-07 ENCOUNTER — Institutional Professional Consult (permissible substitution): Admit: 2022-11-07 | Discharge: 2022-11-07 | Payer: PRIVATE HEALTH INSURANCE

## 2022-11-07 DIAGNOSIS — C9100 Acute lymphoblastic leukemia not having achieved remission: Principal | ICD-10-CM

## 2022-11-07 LAB — CMV DNA, QUANTITATIVE, PCR: CMV VIRAL LD: NOT DETECTED

## 2022-11-07 LAB — CBC W/ AUTO DIFF
BASOPHILS ABSOLUTE COUNT: 0 10*9/L (ref 0.0–0.1)
BASOPHILS RELATIVE PERCENT: 1 %
EOSINOPHILS ABSOLUTE COUNT: 0 10*9/L (ref 0.0–0.5)
EOSINOPHILS RELATIVE PERCENT: 3.4 %
HEMATOCRIT: 32 % — ABNORMAL LOW (ref 34.0–44.0)
HEMOGLOBIN: 11.1 g/dL — ABNORMAL LOW (ref 11.3–14.9)
LYMPHOCYTES ABSOLUTE COUNT: 0.7 10*9/L — ABNORMAL LOW (ref 1.1–3.6)
LYMPHOCYTES RELATIVE PERCENT: 53.5 %
MEAN CORPUSCULAR HEMOGLOBIN CONC: 34.7 g/dL (ref 32.0–36.0)
MEAN CORPUSCULAR HEMOGLOBIN: 39.4 pg — ABNORMAL HIGH (ref 25.9–32.4)
MEAN CORPUSCULAR VOLUME: 113.5 fL — ABNORMAL HIGH (ref 77.6–95.7)
MEAN PLATELET VOLUME: 8.2 fL (ref 6.8–10.7)
MONOCYTES ABSOLUTE COUNT: 0.2 10*9/L — ABNORMAL LOW (ref 0.3–0.8)
MONOCYTES RELATIVE PERCENT: 17.7 %
NEUTROPHILS ABSOLUTE COUNT: 0.3 10*9/L — CL (ref 1.8–7.8)
NEUTROPHILS RELATIVE PERCENT: 24.4 %
NUCLEATED RED BLOOD CELLS: 0 /100{WBCs} (ref <=4)
PLATELET COUNT: 103 10*9/L — ABNORMAL LOW (ref 150–450)
RED BLOOD CELL COUNT: 2.82 10*12/L — ABNORMAL LOW (ref 3.95–5.13)
RED CELL DISTRIBUTION WIDTH: 15.4 % — ABNORMAL HIGH (ref 12.2–15.2)
WBC ADJUSTED: 1.3 10*9/L — ABNORMAL LOW (ref 3.6–11.2)

## 2022-11-07 LAB — COMPREHENSIVE METABOLIC PANEL
ALBUMIN: 3.7 g/dL (ref 3.4–5.0)
ALKALINE PHOSPHATASE: 53 U/L (ref 46–116)
ALT (SGPT): 46 U/L (ref 10–49)
ANION GAP: 9 mmol/L (ref 5–14)
AST (SGOT): 36 U/L — ABNORMAL HIGH (ref <=34)
BILIRUBIN TOTAL: 0.5 mg/dL (ref 0.3–1.2)
BLOOD UREA NITROGEN: 16 mg/dL (ref 9–23)
BUN / CREAT RATIO: 23
CALCIUM: 9.4 mg/dL (ref 8.7–10.4)
CHLORIDE: 109 mmol/L — ABNORMAL HIGH (ref 98–107)
CO2: 25.4 mmol/L (ref 20.0–31.0)
CREATININE: 0.7 mg/dL
EGFR CKD-EPI (2021) FEMALE: >90 mL/min/{1.73_m2} (ref >=60)
GLUCOSE RANDOM: 172 mg/dL (ref 70–179)
POTASSIUM: 3.6 mmol/L (ref 3.4–4.8)
PROTEIN TOTAL: 6.6 g/dL (ref 5.7–8.2)
SODIUM: 143 mmol/L (ref 135–145)

## 2022-11-07 LAB — LACTATE DEHYDROGENASE: LACTATE DEHYDROGENASE: 399 U/L — ABNORMAL HIGH (ref 120–246)

## 2022-11-07 MED ADMIN — heparin, porcine (PF) 100 unit/mL injection 500 Units: 500 [IU] | INTRAVENOUS | @ 13:00:00 | Stop: 2022-11-07

## 2022-11-07 NOTE — Unmapped (Signed)
Clinical Support on 11/07/2022   Component Date Value Ref Range Status    WBC 11/07/2022 1.3 (L)  3.6 - 11.2 10*9/L Final    RBC 11/07/2022 2.82 (L)  3.95 - 5.13 10*12/L Final    HGB 11/07/2022 11.1 (L)  11.3 - 14.9 g/dL Final    HCT 16/05/9603 32.0 (L)  34.0 - 44.0 % Final    MCV 11/07/2022 113.5 (H)  77.6 - 95.7 fL Final    MCH 11/07/2022 39.4 (H)  25.9 - 32.4 pg Final    MCHC 11/07/2022 34.7  32.0 - 36.0 g/dL Final    RDW 54/04/8118 15.4 (H)  12.2 - 15.2 % Final    MPV 11/07/2022 8.2  6.8 - 10.7 fL Final    Platelet 11/07/2022 103 (L)  150 - 450 10*9/L Final    nRBC 11/07/2022 0  <=4 /100 WBCs Final    Neutrophils % 11/07/2022 24.4  % Final    Lymphocytes % 11/07/2022 53.5  % Final    Monocytes % 11/07/2022 17.7  % Final    Eosinophils % 11/07/2022 3.4  % Final    Basophils % 11/07/2022 1.0  % Final    Absolute Neutrophils 11/07/2022 0.3 (LL)  1.8 - 7.8 10*9/L Final    Absolute Lymphocytes 11/07/2022 0.7 (L)  1.1 - 3.6 10*9/L Final    Absolute Monocytes 11/07/2022 0.2 (L)  0.3 - 0.8 10*9/L Final    Absolute Eosinophils 11/07/2022 0.0  0.0 - 0.5 10*9/L Final    Absolute Basophils 11/07/2022 0.0  0.0 - 0.1 10*9/L Final    Macrocytosis 11/07/2022 Moderate (A)  Not Present Final

## 2022-11-07 NOTE — Unmapped (Signed)
Pt's port accessed - flushed, bld return noted, labs drawn and heplocked. Pt tolerated well.

## 2022-11-07 NOTE — Unmapped (Signed)
Patient arrived in the infusion clinic at 0850.  Weight and Vitals were obtained. Port was accessed during nurse visit, RN flushed with blood return, dressing clean, dry, and intact.  Labs were drawn during nurse visit and resulted outside of therapy plan parameters, so no need for platelet or blood transfusion. Port de accessed and heparin flushed and blood return present.  Patient given after visit summary and then sent to checkout and discharged home to self care.

## 2022-11-09 ENCOUNTER — Telehealth
Admit: 2022-11-09 | Discharge: 2022-11-10 | Payer: PRIVATE HEALTH INSURANCE | Attending: Infectious Disease | Primary: Infectious Disease

## 2022-11-09 DIAGNOSIS — C9100 Acute lymphoblastic leukemia not having achieved remission: Principal | ICD-10-CM

## 2022-11-09 DIAGNOSIS — B259 Cytomegaloviral disease, unspecified: Principal | ICD-10-CM

## 2022-11-09 NOTE — Unmapped (Incomplete)
IMMUNOCOMPROMISED HOST INFECTIOUS DISEASE CONSULT NOTE    Patricia [name] is being seen in consultation at the request of [name] for evaluation of CMV viremia.    Assessment/Recommendations:    Patricia [name] is a 47 y.o. 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 Her CMV viremia has remained low level however she has demonstrated resolution of fevers with treatment of CMV. Now that her CMV VL is <35 we can start letermovir (and valtrex) as prophylaxis, monitor her CMV viral load for another month and then stop unless she demonstrates rising CMV at that time.     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  -S/p 5 cycles GRAAPH-2005 + Dasatinib (treatment includes Rituximab)  -Now in MRD- CR1  -Receiving consolidation w Vincristine, pred, dasatinib    #Hemorrhoids + anal fissure 05/05/22  -resolved    Active Infections  #Fever of unknown origin 05/11/22, likely 2/2 CMV viremia without disease  -10/11 Normal CT A/P, CT chest with mild diffuse bronchial wall thickening  -10/11 CT sinus with mild mucosal thickening   -Bld cx neg, urine culture w 10-50K E.faecalis, not treated  -CMV viremia as below in micro    Rx: 12/5-12/26 Valcyte 900mg  PO BID-->12/26-1/24/24 Valcyte 450mg  PO BID-->09/14/22 Valcyte 900mg  PO BID-->10/05/22 start letermovir (and valtrex)    Antimicrobial allergies/intolerances: Erythromycin, Azithro, Sulfa       RECOMMENDATIONS    Diagnostic  Repeat CMV viral load Q week for 4 weeks and if no spike in CMV, ok to stop checking CMV viral load    Treatment  START letermovir  STOP valcyte  START valtrex    Monitoring for antimicrobial toxicities  Patricia [name] is currently receiving drug therapy requiring intensive lab monitoring for toxicity.  CBC    Prophylaxis  Dapsone  On levofloxacin and fluconazole while neutropenic    Follow up in 4 weeks           Recommendations were communicated via shared medical record.    History of Present Illness:      Source of information includes: Electronic Medical Records and Discussion with patient      Since last clinic visit, Patricia [name] had continued to develop fevers and maintained a low level CMV viremia, so she was started on valcyte and fevers resolved. She was taken off valcyte and fevers returned, although on valcyte her counts have been suppressed and she has had notable and unexpected neutropenia. She has letermovir in hand and has been waiting for ICID eval to start. Her CMV viral load from yesterday remains <35 so we discussed her starting letermovir and valtrex and stopping valcyte which she has agreed to do.     In terms of how she is doing otherwise, her energy has improved especially on valcyte. She denies fevers or chills, nightsweats, sinus pain, cough, SOB, diarrhea, rashes.     Allergies:  Allergies   Allergen Reactions    Erythromycin Hives     Other reaction(s): Not available    Other      Pt cant take ibuprofen due to condition.    Sulfa (Sulfonamide Antibiotics) Anaphylaxis     Other reaction(s): Not available    Azithromycin        Medications:     Current Outpatient Medications:     acetaminophen (TYLENOL 8 HOUR) 650 MG CR tablet, Take 2 tablets (1,300 mg total) by mouth every eight (8) hours as needed for pain., Disp: , Rfl:  albuterol HFA 90 mcg/actuation inhaler, Inhale 2 puffs every six (6) hours as needed for wheezing., Disp: , Rfl:     cetirizine (ZYRTEC) 10 MG tablet, Take 1 tablet (10 mg total) by mouth nightly., Disp: 30 tablet, Rfl: 2    dapsone 100 MG tablet, TAKE 1 TABLET(100 MG) BY MOUTH DAILY, Disp: 30 tablet, Rfl: 11    dasatinib (SPRYCEL) 70 MG tablet, Take 1 tablet (70 mg total) by mouth daily., Disp: 30 tablet, Rfl: 7    escitalopram oxalate (LEXAPRO) 10 MG tablet, Take 1 tablet (10 mg total) by mouth daily., Disp: 90 tablet, Rfl: 0    fluconazole (DIFLUCAN) 200 MG tablet, Take 1 tablet (200 mg total) by mouth daily., Disp: 30 tablet, Rfl: 2    letermovir (PREVYMIS) 480 mg tablet, Take 1 tablet (480 mg total) by mouth daily., Disp: 28 tablet, Rfl: 2    levalbuterol (XOPENEX) 0.31 mg/3 mL nebulizer solution, Inhale 3 mL (0.31 mg total) by nebulization every four (4) hours as needed for wheezing (cough)., Disp: 45 mL, Rfl: 0    levoFLOXacin (LEVAQUIN) 500 MG tablet, Take 1 tablet (500 mg total) by mouth daily., Disp: 30 tablet, Rfl: 2    norethindrone (MICRONOR) 0.35 mg tablet, Take 1 tablet by mouth daily. (Patient not taking: Reported on 10/18/2022), Disp: 84 tablet, Rfl: 3    PONATinib (ICLUSIG) 15 mg tablet, Take 1 tablet (15 mg total) by mouth daily. Swallow tablets whole. Do not crush, break, cut or chew tablets., Disp: 30 tablet, Rfl: 5    predniSONE (DELTASONE) 50 MG tablet, Take 4 tablets (200 mg total) by mouth once daily on days 1-5 of each cycle., Disp: 20 tablet, Rfl: 5    tizanidine (ZANAFLEX) 4 MG tablet, Take 1 tablet (4 mg total) by mouth nightly., Disp: 90 tablet, Rfl: 2    triamcinolone (KENALOG) 0.1 % cream, Apply topically two (2) times a day for 14 days., Disp: 45 g, Rfl: 0    valACYclovir (VALTREX) 500 MG tablet, Take 1 tablet (500 mg total) by mouth daily., Disp: 30 tablet, Rfl: 12    Current antibiotics:  Valcyte   Dapsone ppx    Previous antibiotics:  Levo, Cefepime  Flucon while neutropenic    Current/Prior immunomodulators:  Vincristine, pred, dasatinib    Other medications reviewed.       Review of Systems:  All other systems reviewed are negative.     Objective     Vital Signs:  There were no vitals taken for this visit.    Physical Exam:     Const [x]  vital signs above    [x]  NAD, non-toxic appearance []  Chronically ill-appearing, non-distressed        Eyes [x]  Lids normal bilaterally, conjunctiva anicteric and noninjected OU     [] PERRL  [] EOMI        ENMT [x]  Normal appearance of external nose and ears, no nasal discharge        []  MMM, no lesions on lips or gums []  No thrush, leukoplakia, oral lesions  []  Dentition good []  Edentulous []  Dental caries present  []  Hearing normal  []  TMs with good light reflexes bilaterally         Neck [x]  Neck of normal appearance and trachea midline        []  No thyromegaly, nodules, or tenderness   []  Full neck ROM        Lymph []  No LAD in neck     []  No LAD in  supraclavicular area     []  No LAD in axillae   []  No LAD in epitrochlear chains     []  No LAD in inguinal areas        CV []  RRR            []  No peripheral edema     []  Pedal pulses intact   []  No abnormal heart sounds appreciated   []  Extremities WWP         Resp [x]  Normal WOB at rest    [x]  No breathlessness with speaking, no coughing  []  CTA anteriorly    []  CTA posteriorly          GI [x]  Normal inspection, NTND   [x]  NABS     [x]  No umbilical hernia on exam       [x]  No hepatosplenomegaly     []  Inspection of perineal and perianal areas normal        GU []  Normal external genitalia     [] No urinary catheter present in urethra   []  No CVA tenderness    []  No tenderness over renal allograft        MSK [x]  No clubbing or cyanosis of hands       []  No vertebral point tenderness  []  No focal tenderness or abnormalities on palpation of joints in RUE, LUE, RLE, or LLE        Skin []  No rashes, lesions, or ulcers of visualized skin     []  Skin warm and dry to palpation         Neuro [x]  Face expression symmetric  []  Sensation to light touch grossly intact throughout    []  Moves extremities equally    [x]  No tremor noted        []  CNs II-XII grossly intact     []  DTRs normal and symmetric throughout []  Gait unremarkable        Psych [x]  Appropriate affect       [x]  Fluent speech         [x]  Attentive, good eye contact  []  Oriented to person, place, time          [x]  Judgment and insight are appropriate             Labs:  Results in Past 30 Days  Result Component Current Result Ref Range Previous Result Ref Range   Absolute Eosinophils 0.0 (11/07/2022) 0.0 - 0.5 10*9/L 0.0 (10/31/2022) 0.0 - 0.5 10*9/L   Absolute Lymphocytes 0.7 (L) (11/07/2022) 1.1 - 3.6 10*9/L 0.8 (L) (10/31/2022) 1.1 - 3.6 10*9/L   Absolute Neutrophils 0.3 (LL) (11/07/2022) 1.8 - 7.8 10*9/L 0.5 (L) (10/31/2022) 1.8 - 7.8 10*9/L   Alkaline Phosphatase 53 (11/07/2022) 46 - 116 U/L 59 (10/31/2022) 46 - 116 U/L   ALT 46 (11/07/2022) 10 - 49 U/L 82 (H) (10/31/2022) 10 - 49 U/L   AST 36 (H) (11/07/2022) <=34 U/L 57 (H) (10/31/2022) <=34 U/L   BUN 16 (11/07/2022) 9 - 23 mg/dL 14 (08/06/1094) 9 - 23 mg/dL   Calcium 9.4 (0/10/5407) 8.7 - 10.4 mg/dL 9.0 (03/01/1913) 8.7 - 78.2 mg/dL   Creatinine 9.56 (09/01/3084) 0.55 - 1.02 mg/dL 5.78 (11/05/9627) 5.28 - 1.02 mg/dL   HGB 41.3 (L) (09/04/4008) 11.3 - 14.9 g/dL 27.2 (L) (12/02/6642) 03.4 - 14.9 g/dL   Platelet 742 (L) (12/07/5636) 150 - 450 10*9/L 94 (L) (10/31/2022) 150 - 450 10*9/L   Potassium 3.6 (11/07/2022) 3.4 - 4.8 mmol/L 3.8 (10/31/2022) 3.4 -  4.8 mmol/L   Total Bilirubin 0.5 (11/07/2022) 0.3 - 1.2 mg/dL 0.6 (08/06/1094) 0.3 - 1.2 mg/dL   WBC 1.3 (L) (0/10/5407) 3.6 - 11.2 10*9/L 1.5 (L) (10/31/2022) 3.6 - 11.2 10*9/L     Labs reviewed: ANC 300, lower than last time in clinic, AST slightly elevated but improved, creatinine normal 0.70 stable, ALC 0.7 lower than before    Microbiology:  CMV   05/13/22 318  05/18/22 ND  05/26/22 ND  06/02/22 670  06/06/22 223  06/14/22 156  06/22/22 230  06/27/22 492  07/04/22 294   12/5-12/26 Valcyte 900mg  PO BID  07/11/22 113  07/18/22 75  07/26/22 ND   12/26-1/24/24 Valcyte 450mg  PO BID  08/04/22 ND  08/08/22 <35  08/18/22 ND  08/23/22 ND  1//29/24 ND  09/05/22 59  09/12/22 408   09/14/22 Valcyte 900mg  PO BID  09/20/22 <35  09/26/22 <35  10/04/22 <35    10/10/22 147  3/19, 3/25, 4/1, 11/07/22 ND    Imaging:  None new    Serologies:  Lab Results   Component Value Date    Hep B Surface Ag Nonreactive 05/18/2022    Hep B S Ab Reactive (A) 05/18/2022    Hep B Surf Ab Quant 101.02 (H) 05/18/2022    Hep B Core Total Ab Nonreactive 05/18/2022    Hepatitis C Ab Nonreactive 10/28/2021       Immunizations:  Immunization History   Administered Date(s) Administered    COVID-19 VACC,MRNA,(PFIZER)(PF) 02/20/2020, 03/12/2020    TdaP 02/08/2017, History   Administered Date(s) Administered    COVID-19 VACC,MRNA,(PFIZER)(PF) 02/20/2020, 03/12/2020    TdaP 02/08/2017, 10/21/2021       Time spent on counseling/coordination of care: 15 Minutes  Total time spent with patient: 35 Minutes      {    Coding tips - Do not edit this text, it will delete upon signing of note!    Telephone visits [phone] for Physicians and APPs and [phone] for Non- Physician Clinicians)- Only use minutes on the phone to determine level of service.    Video visits [phone]) - Use either level of medical decision making just as an in-person visit OR time which includes both minutes on video and pre/post minutes to determine the level of service.      :[id number]}  The patient reports they are physically located in [place] and is currently: at home. I conducted a audio/video visit. I spent  0s on the video call with the patient. I spent an additional 15 minutes on pre- and post-visit activities on the date of service .

## 2022-11-14 DIAGNOSIS — C9100 Acute lymphoblastic leukemia not having achieved remission: Principal | ICD-10-CM

## 2022-11-15 ENCOUNTER — Ambulatory Visit: Admit: 2022-11-15 | Discharge: 2022-11-15 | Payer: PRIVATE HEALTH INSURANCE

## 2022-11-15 ENCOUNTER — Ambulatory Visit
Admit: 2022-11-15 | Discharge: 2022-11-15 | Payer: PRIVATE HEALTH INSURANCE | Attending: Adult Health | Primary: Adult Health

## 2022-11-15 ENCOUNTER — Other Ambulatory Visit: Admit: 2022-11-15 | Discharge: 2022-11-15 | Payer: PRIVATE HEALTH INSURANCE

## 2022-11-15 DIAGNOSIS — C9100 Acute lymphoblastic leukemia not having achieved remission: Principal | ICD-10-CM

## 2022-11-15 LAB — CBC W/ AUTO DIFF
BASOPHILS ABSOLUTE COUNT: 0 10*9/L (ref 0.0–0.1)
BASOPHILS RELATIVE PERCENT: 0.8 %
EOSINOPHILS ABSOLUTE COUNT: 0.1 10*9/L (ref 0.0–0.5)
EOSINOPHILS RELATIVE PERCENT: 2.8 %
HEMATOCRIT: 34.5 % (ref 34.0–44.0)
HEMOGLOBIN: 12.4 g/dL (ref 11.3–14.9)
LYMPHOCYTES ABSOLUTE COUNT: 1.1 10*9/L (ref 1.1–3.6)
LYMPHOCYTES RELATIVE PERCENT: 56.2 %
MEAN CORPUSCULAR HEMOGLOBIN CONC: 35.8 g/dL (ref 32.0–36.0)
MEAN CORPUSCULAR HEMOGLOBIN: 39.4 pg — ABNORMAL HIGH (ref 25.9–32.4)
MEAN CORPUSCULAR VOLUME: 109.9 fL — ABNORMAL HIGH (ref 77.6–95.7)
MEAN PLATELET VOLUME: 8 fL (ref 6.8–10.7)
MONOCYTES ABSOLUTE COUNT: 0.4 10*9/L (ref 0.3–0.8)
MONOCYTES RELATIVE PERCENT: 22 %
NEUTROPHILS ABSOLUTE COUNT: 0.4 10*9/L — CL (ref 1.8–7.8)
NEUTROPHILS RELATIVE PERCENT: 18.2 %
PLATELET COUNT: 179 10*9/L (ref 150–450)
RED BLOOD CELL COUNT: 3.13 10*12/L — ABNORMAL LOW (ref 3.95–5.13)
RED CELL DISTRIBUTION WIDTH: 13.8 % (ref 12.2–15.2)
WBC ADJUSTED: 1.9 10*9/L — ABNORMAL LOW (ref 3.6–11.2)

## 2022-11-15 LAB — COMPREHENSIVE METABOLIC PANEL
ALBUMIN: 4 g/dL (ref 3.4–5.0)
ALKALINE PHOSPHATASE: 53 U/L (ref 46–116)
ALT (SGPT): 34 U/L (ref 10–49)
ANION GAP: 9 mmol/L (ref 5–14)
AST (SGOT): 36 U/L — ABNORMAL HIGH (ref <=34)
BILIRUBIN TOTAL: 0.4 mg/dL (ref 0.3–1.2)
BLOOD UREA NITROGEN: 14 mg/dL (ref 9–23)
BUN / CREAT RATIO: 21
CALCIUM: 9.7 mg/dL (ref 8.7–10.4)
CHLORIDE: 106 mmol/L (ref 98–107)
CO2: 27 mmol/L (ref 20.0–31.0)
CREATININE: 0.68 mg/dL
EGFR CKD-EPI (2021) FEMALE: >90 mL/min/{1.73_m2} (ref >=60)
GLUCOSE RANDOM: 121 mg/dL (ref 70–179)
POTASSIUM: 3.9 mmol/L (ref 3.4–4.8)
PROTEIN TOTAL: 6.7 g/dL (ref 5.7–8.2)
SODIUM: 142 mmol/L (ref 135–145)

## 2022-11-15 LAB — AMYLASE: AMYLASE: 104 U/L (ref 30–118)

## 2022-11-15 LAB — CMV DNA, QUANTITATIVE, PCR: CMV VIRAL LD: NOT DETECTED

## 2022-11-15 LAB — LIPID PANEL
CHOLESTEROL/HDL RATIO SCREEN: 6.1 — ABNORMAL HIGH (ref 1.0–4.5)
CHOLESTEROL: 255 mg/dL — ABNORMAL HIGH (ref <=200)
HDL CHOLESTEROL: 42 mg/dL (ref 40–60)
NON-HDL CHOLESTEROL: 213 mg/dL — ABNORMAL HIGH (ref 70–130)
TRIGLYCERIDES: 413 mg/dL — ABNORMAL HIGH (ref 0–150)

## 2022-11-15 LAB — LIPASE: LIPASE: 51 U/L (ref 12–53)

## 2022-11-15 LAB — HEMOGLOBIN A1C: HEMOGLOBIN A1C: <3.8 % — ABNORMAL LOW (ref 4.8–5.6)

## 2022-11-15 MED ORDER — NORETHINDRONE (CONTRACEPTIVE) 0.35 MG TABLET
ORAL_TABLET | Freq: Every day | ORAL | 3 refills | 84 days | Status: CP
Start: 2022-11-15 — End: 2023-11-15
  Filled 2023-01-16: qty 84, 84d supply, fill #0

## 2022-11-15 MED ADMIN — sodium chloride (NS) 0.9 % infusion: 100 mL/h | INTRAVENOUS | @ 17:00:00

## 2022-11-15 MED ADMIN — vinCRIStine (ONCOVIN) 2 mg in sodium chloride (NS) 0.9 % 25 mL IVPB: 2 mg | INTRAVENOUS | @ 17:00:00 | Stop: 2022-11-15

## 2022-11-15 MED ADMIN — heparin, porcine (PF) 100 unit/mL injection 500 Units: 500 [IU] | INTRAVENOUS | @ 17:00:00 | Stop: 2022-11-16

## 2022-11-15 NOTE — Unmapped (Signed)
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 Burchill today to discuss plan for C7 of maintenance chemo. She has been holding dasatinib for a month due to low ANC (notably off valcyte and undetectable CMV leading [place] to believe dasatinib may be contributing). She feels great, denies any fevers this last month. She denies bleeding, rash, PN, GI upset. Has noted some weight gain. She has not restarted her period and describes that she isn't on micronor but also isn't having sex (has been painful in setting of lupron); it has been about 4 months since last injection. 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 She has upcoming wedding showers and out of town trips over the next 2-3 months she's looking forward to attending cautiously.     Labs: WBC 1.9, ANC 0.4, Hgb 12.4, PLT 179, CMP shows improved LFTs  CMV viral load 4/16 pending, but has been undetectable since 3/11  Total lgG 237 from 11/27 (now getting monthly IVIG); absolute CD4 count 276  BCR-ABL p190 (10/04/22): negative  BMBx (10/04/22): <5% blasts, MRD negative    Oncologic History:  Hematology/Oncology History Overview Note   Referring/Local Oncologist:    Diagnosis:   10/29/2021  Bone marrow, left iliac, aspiration and biopsy  -  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 The majority of the abnormal cells (64/88) contained an additional BCR/ABL1 fusion signal, while 9/88 abnormal cells contained an additional ABL1 and ASS1 signal.       Genetics:   Karyotype/FISH:   RESULTS   Date Value Ref Range Status   10/28/2021   Final    NOTE: This report reflects a combined study from a peripheral blood and a bone marrow core biopsy. Eleven cells from the peripheral blood and nine cells from the bone marrow core biopsy were analyzed. The BCR/ABL1 FISH analysis was performed on the peripheral blood.     Abnormal Karyotype:  47,XX,t(9;22)(q34;q11.2),+der(22)t(9;22)[18]/46,XX[2]    Abnormal FISH:  A BCR/ABL1 interphase FISH assay showed a signal pattern consistent with a BCR::ABL1 rearrangement and the 9;22 translocation in 88% of the 100 cells scored. The majority of the abnormal cells (64/88) contained an additional BCR/ABL1 fusion signal, while 9/88 abnormal cells contained an additional ABL1 and ASS1 signal.           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  MRD by flow insufficient, p190 2/100,000  12/10/2021: Cycle 2  B cycle + rituximab  12/14/21: IT#4  12/17/21: IT#5  01/10/22: Cycle 3 A cycle + rituximab  01/11/22: IT #6  01/24/22: Bmbx - MRD-neg by PCR (p190)   02/17/22: C4 B cycle + ritux  02/18/22: IT#7  03/21/22: C5 A cycle + ritux (4th dose)  03/22/22: IT#8  04/29/22: Bmbx - MRD-neg by PCR (p190)   05/18/22: restart dasatinib 70mg  s/p neutropenia  05/26/22: Start maintenance vincristine 2g, Prednisone 200mg  D1-5   06/02/22: IT #9  06/27/22: C2 Maintenance: vincristine 2g, Prednisone 200mg  D1-5, HOLD dasatinib due to neutropenia  07/04/22: RESTART dasatinib 70mg   07/21/22: IT #10  07/26/22: C3 Maintenance: vincristine 2g, Prednisone 200mg  D1-5, dasatinib 70 mg  08/08/22: IT #11 - prophylaxis completed  08/23/22: C4 Maintenance: vincristine 2g, Prednisone 200mg  D1-5, dasatinib 70 mg  09/20/22: C5 Maintenance: vincristine 2g, Prednisone 200mg  D1-5, dasatinib 70 mg  10/04/22: Bone marrow biopsy - continued remission, MRD and BCR/ABL p190 negative  10/18/22: C6 Maintenance. ANC 0.4, HOLD dasatinib, continue vincristine 2g and Prednisone 200mg  D1-5  11/15/22: C7 Maintenance ANC 0.4. 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)   10/29/2021 Initial Diagnosis    B-cell acute lymphoblastic leukemia (ALL) (CMS-HCC)     10/30/2021 - 03/30/2022 Chemotherapy    IP/OP LEUKEMIA GRAAPH-2005 < 60 YO (OP PEGFILGRASTIM ON DAY 7)  [No description for this plan]     01/06/2022 - 04/07/2022 Endocrine/Hormone Therapy    OP LEUPROLIDE (LUPRON) 11.25 MG EVERY 3 MONTHS  Plan Provider: [name], MD     05/26/2022 -  Chemotherapy    OP LEUKEMIA VINCRISTINE  vinCRIStine 2 mg IV on day 1     07/26/2022 Endocrine/Hormone Therapy    OP LEUPROLIDE (LUPRON) 11.25 MG EVERY 3 MONTHS  Plan Provider: [name], MD         Weight and Vitals:  Wt Readings from Last 3 Encounters:   11/15/22 85.5 kg (188 lb 7.9 oz)   10/26/22 85.9 kg (189 lb 4.8 oz)   10/18/22 87.2 kg (192 lb 3.9 oz)     Temp Readings from Last 3 Encounters:   11/15/22 36.9 ??C (98.4 ??F) (Oral)   11/15/22 36.9 ??C (98.5 ??F) (Oral)   10/26/22 37 ??C (98.6 ??F)     BP Readings from Last 3 Encounters:   11/15/22 143/86   11/15/22 121/92   10/26/22 130/90     Pulse Readings from Last 3 Encounters:   11/15/22 76   11/15/22 93   10/26/22 88       Pertinent Labs:  Lab on 11/15/2022   Component Date Value Ref Range Status    Sodium 11/15/2022 142  135 - 145 mmol/L Final    Potassium 11/15/2022 3.9  3.4 - 4.8 mmol/L Final    Chloride 11/15/2022 106  98 - 107 mmol/L Final    CO2 11/15/2022 27.0  20.0 - 31.0 mmol/L Final    Anion Gap 11/15/2022 9  5 - 14 mmol/L Final    BUN 11/15/2022 14  9 - 23 mg/dL Final    Creatinine 16/05/9603 0.68  0.55 - 1.02 mg/dL Final    BUN/Creatinine Ratio 11/15/2022 21   Final    eGFR CKD-EPI (2021) Female 11/15/2022 >90  >=60 mL/min/1.11m2 Final    eGFR calculated with CKD-EPI 2021 equation in accordance with [employer] and [employer] of Nephrology Task Force recommendations.    Glucose 11/15/2022 121  70 - 179 mg/dL Final    Calcium 96/29/5284 9.7  8.7 - 10.4 mg/dL Final    Albumin 13/24/4010 4.0  3.4 - 5.0 g/dL Final    Total Protein 11/15/2022 6.7  5.7 - 8.2 g/dL Final    Total Bilirubin 11/15/2022 0.4  0.3 - 1.2 mg/dL Final    AST 27/25/3664 36 (H)  <=34 U/L Final    ALT 11/15/2022 34  10 - 49 U/L Final    Alkaline Phosphatase 11/15/2022 53  46 - 116 U/L Final    WBC 11/15/2022 1.9 (L)  3.6 - 11.2 10*9/L Final    RBC 11/15/2022 3.13 (L)  3.95 - 5.13 10*12/L Final    HGB 11/15/2022 12.4  11.3 - 14.9 g/dL Final    HCT 40/34/7425 34.5  34.0 - 44.0 % Final    MCV 11/15/2022 109.9 (H)  77.6 - 95.7 fL Final    MCH 11/15/2022 39.4 (H)  25.9 - 32.4 pg Final    MCHC 11/15/2022 35.8  32.0 - 36.0 g/dL Final    RDW 95/63/8756 13.8  12.2 - 15.2 % Final    MPV 11/15/2022 8.0  6.8 - 10.7 fL Final    Platelet 11/15/2022 179  150 - 450 10*9/L Final    Neutrophils % 11/15/2022 18.2  % Final    Lymphocytes % 11/15/2022 56.2  % Final    Monocytes % 11/15/2022 22.0  % Final    Eosinophils % 11/15/2022 2.8  % Final    Basophils % 11/15/2022 0.8  % Final    Absolute Neutrophils 11/15/2022 0.4 (LL)  1.8 - 7.8 10*9/L Final    Absolute Lymphocytes 11/15/2022 1.1  1.1 - 3.6 10*9/L Final    Absolute Monocytes 11/15/2022 0.4  0.3 - 0.8 10*9/L Final    Absolute Eosinophils 11/15/2022 0.1  0.0 - 0.5 10*9/L Final    Absolute Basophils 11/15/2022 0.0  0.0 - 0.1 10*9/L Final    Macrocytosis 11/15/2022 Slight (A)  Not Present Final    Triglycerides 11/15/2022 413 (H)  0 - 150 mg/dL Final    Cholesterol 43/32/9518 255 (H)  <=200 mg/dL Final    HDL 84/16/6063 42  40 - 60 mg/dL Final    LDL Calculated 11/15/2022    Final    NHLBI Recommended Ranges, LDL Cholesterol, for Adults (20+yrs) (ATPIII), mg/dL  Optimal              <016  Near Optimal        100-129  Borderline High     130-159  High                160-189  Very High            >=190  NHLBI Recommended Ranges, LDL Cholesterol, for Children (2-19 yrs), mg/dL  Desirable            <010  Borderline High     110-129  High                 >=130    Unable to calculate due to triglyceride greater than 400 mg/dL.    VLDL Cholesterol Cal 11/15/2022    Final    Unable to calculate due to triglyceride greater than 400 mg/dL.    Chol/HDL Ratio 11/15/2022 6.1 (H)  1.0 - 4.5 Final    Non-HDL Cholesterol 11/15/2022 213 (H)  70 - 130 mg/dL Final    Non-HDL Cholesterol Recommended Ranges (mg/dL)  Optimal       <  130  Near Optimal 130 - 159  Borderline High 160 - 189  High             190 - 219  Very High       >220      FASTING 11/15/2022 Unknown   Final    Lipase 11/15/2022 51  12 - 53 U/L Final    Amylase 11/15/2022 104  30 - 118 U/L Final       Allergies:   Allergies   Allergen Reactions    Erythromycin Hives     Other reaction(s): Not available    Other      Pt cant take ibuprofen due to condition.    Sulfa (Sulfonamide Antibiotics) Anaphylaxis     Other reaction(s): Not available    Azithromycin        Drug Interactions: none identified; dasatinib is a major 3A4 substrate, caution with 3A4 inh/ind; letermovir is a mod 3A4 inhibitor (no adjustments necessary for dasatinib/VCR, monitor); fluconazole max dose with VCR is 200 mg daily; avoid acid reducers with dasatinib.       Current Medications:  Current Outpatient Medications   Medication Sig Dispense Refill    acetaminophen (TYLENOL 8 HOUR) 650 MG CR tablet Take 2 tablets (1,300 mg total) by mouth every eight (8) hours as needed for pain.      albuterol HFA 90 mcg/actuation inhaler Inhale 2 puffs every six (6) hours as needed for wheezing.      cetirizine (ZYRTEC) 10 MG tablet Take 1 tablet (10 mg total) by mouth nightly. 30 tablet 2    dapsone 100 MG tablet TAKE 1 TABLET(100 MG) BY MOUTH DAILY 30 tablet 11    escitalopram oxalate (LEXAPRO) 10 MG tablet Take 1 tablet (10 mg total) by mouth daily. 90 tablet 0    fluconazole (DIFLUCAN) 200 MG tablet Take 1 tablet (200 mg total) by mouth daily. 30 tablet 2    letermovir (PREVYMIS) 480 mg tablet Take 1 tablet (480 mg total) by mouth daily. 28 tablet 2    levalbuterol (XOPENEX) 0.31 mg/3 mL nebulizer solution Inhale 3 mL (0.31 mg total) by nebulization every four (4) hours as needed for wheezing (cough). 45 mL 0    levoFLOXacin (LEVAQUIN) 500 MG tablet Take 1 tablet (500 mg total) by mouth daily. 30 tablet 2    predniSONE (DELTASONE) 50 MG tablet Take 4 tablets (200 mg total) by mouth once daily on days 1-5 of each cycle. 20 tablet 5    tizanidine (ZANAFLEX) 4 MG tablet Take 1 tablet (4 mg total) by mouth nightly. 90 tablet 2    triamcinolone (KENALOG) 0.1 % cream Apply topically two (2) times a day for 14 days. 45 g 0    valACYclovir (VALTREX) 500 MG tablet Take 1 tablet (500 mg total) by mouth daily. 30 tablet 12    PONATinib (ICLUSIG) 15 mg tablet Take 1 tablet (15 mg total) by mouth daily. Swallow tablets whole. Do not crush, break, cut or chew tablets. (Patient not taking: Reported on 11/15/2022) 30 tablet 5     No current facility-administered medications for this visit.     Facility-Administered Medications Ordered in Other Visits   Medication Dose Route Frequency Provider Last Rate Last Admin    dexAMETHasone (DECADRON) 4 mg/mL injection 20 mg  20 mg Intravenous Once PRN Lenon Ahmadi, AGNP        diphenhydrAMINE (BENADRYL) injection 25 mg  25 mg Intravenous Once PRN Lenon Ahmadi, AGNP EPINEPHrine (  EPIPEN) injection 0.3 mg  0.3 mg Intramuscular Once PRN Lenon Ahmadi, AGNP        famotidine (PF) (PEPCID) injection 20 mg  20 mg Intravenous Once PRN Lenon Ahmadi, AGNP        heparin, porcine (PF) 100 unit/mL injection 500 Units  500 Units Intravenous Q30 Min PRN [name], MD   500 Units at 11/15/22 1314    methylPREDNISolone sodium succinate (PF) (SOLU-Medrol) injection 125 mg  125 mg Intravenous Once PRN Lenon Ahmadi, AGNP        OKAY TO SEND MEDICATION/CHEMOTHERAPY TO OUTPATIENT UNIT   Other Once Lenon Ahmadi, AGNP        prochlorperazine (COMPAZINE) injection 10 mg  10 mg Intravenous Q6H PRN Lenon Ahmadi, AGNP        prochlorperazine (COMPAZINE) tablet 10 mg  10 mg Oral Q6H PRN Lenon Ahmadi, AGNP        sodium chloride (NS) 0.9 % infusion  100 mL/hr Intravenous Continuous Lenon Ahmadi, AGNP 100 mL/hr at 11/15/22 1258 100 mL/hr at 11/15/22 1258    sodium chloride (NS) 0.9 % infusion  20 mL/hr Intravenous Continuous PRN Lenon Ahmadi, AGNP        sodium chloride 0.9% (NS) bolus 1,000 mL  1,000 mL Intravenous Once PRN Lenon Ahmadi, AGNP           Adherence: No barriers identified/no missed doses.       Assessment: Ms.Down is a 47 y.o. 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 Patient finished 5 cycles of hyperCVAD + rituximab + dasatinib before transitioning to VCR/dasatinib/prednisone maintenance. She is currently on dasatinib 70 mg daily, previously unable to titrate to normal maintenance dosing due to neutropenia (though may have been CMV and/or valcyte and/or TKI related). She is doing well but counts continue to fluctuate, ANC remains low at 0.4 today. She was transitioned to letermovir on 10/05/22 by ICID to avoid valcyte-induced myelosuppression upon previous check 10/10/22 CMV viral load 147 but has been undetectable since. The etiology of her myelosuppression remains unclear; with transition off Valcyte, it may be secondary to dasatinib however prolonged since she has been holding dasatinib for a month, although Hgb and PLT improved. Most recent BMBx (10/04/22) showed MRD negative disease. Will continue to hold dasatinib, and prepare for ponatinib 15 mg daily once ANC > 1.0 (will dose at 15 mg since BCR-ABL negative). Discussed contraception.     Plan:   1) Ph+ ALL  - Continue vincristine IV on day 1 of 28 day cycle (due today)  - Continue prednisone 200 mg (4 tablets) days 1-5 of 28 day cycle (will restart today)  - HOLD dasatinib 70 mg daily  - When ANC > 1.0 will start ponatinib 15 mg daily. Will start baby aspirin with this. Baseline labs ok, consider following lipids for a repeat fasting. Provided handout/education  - #12 of 12 IT therapy complete  - Labs in 2 weeks to assess ANC (every 2 weeks, weekly next week could show bump in WBC secondary to steroids); CMV will now only be checked monthly  - BCR-ABL continues to be negative (most recent 10/04/22)  - RTC in 4 weeks for C8    2) Infection   -Continue levofloxacin 500 mg daily and fluconazole 200 mg daily for ANC <0.5   -Continue dapsone 100 mg PO daily for PJP ppx  -Continue letermovir 480 mg daily per ICID (started 10/05/22)   -  Continue valacyclovir 500 mg daily (will hold if needing to resume valganciclovir)   -Continue IVIG - dose due 4/24 and continue every 4 weeks given at [facility]     3) Supportive Care  - Continue Tylenol prn for pain. Check temperature before Tylenol dose and avoid taking NSAIDs.  - Can take Dulcolax prn for constipation  - Can take famotidine if having acid reflex from steroids.   - Most recent dose of Lupron on 07/26/22, this is the final dose. She agreed that prior to the decision to resume having sex she will start daily Micronor. She can follow with gynecology for alternatives. Described return of her period (or lack thereof) is not an indicator of fertility and should proceed cautiously (with condoms or birth control) regardless.    F/u:  Future Appointments   Date Time Provider Department Center   11/23/2022  1:45 PM UNCTIF 07 UNCTHERINFET [facility]   11/28/2022  1:15 PM ONCINF HMOB LABS HBHONC [facility]   12/13/2022  8:15 AM ADULT ONC LAB UNCCALAB [facility]   12/13/2022  8:30 AM Ortencia Kick, MD ONCMULTI [facility]   12/13/2022  9:00 AM Lenon Ahmadi, AGNP [id number] [facility]   12/13/2022 10:00 AM ONCHEM LEUKEMIA PHARMACIST [id number]   02/15/2023 10:00 AM Andermann, Alisia Ferrari, MD [id number]       I spent 35 minutes in direct patient care      Manfred Arch, PharmD, BCOP, CPP  Pager: [phone]

## 2022-11-15 NOTE — Unmapped (Signed)
[facility] Follow Up Visit Note     [name]  Patient Age: 47 y.o.  Encounter Date: 11/15/2022    Primary Care Provider:  [name], MD    Referring Physician:  Lenon Ahmadi, AGNP  [address]  [id number]  [place],  [place] [address]    Cancer Diagnosis: Ph+ B-ALL; Initial Dx 10/29/2021  Cancer status: CR1, BCR/ABL p190 negative in marrow  Treatment Regimen: First Line, TKI + Maintenance (vincristine, prednisone) s/p truncated course of GRAAPH-2005, HyperCVAD  Treatment Goal: Curative  Comorbidities: HTN, anxiety, hemorrhoids with anal fissures  Transplant: referral deferred in CR1    Assessment/Plan:  Patricia [name] is a 47 y.o. female with past medical history of chronic right shoulder and back pain, hypertension, anxiety, and recently diagnosed Ph+ ALL. 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  Given her deep molecular remission and her intolerance of hyperCVAD, we are transitioned to maintenance chemotherapy.     She is now s/p 6 Cycle Maintenance dasatinib + vincristine + prednisone. Most recent bone marrow biopsy (10/04/22) demonstrated a deep remission with undetectable BCR/ABL.Marland Kitchen She feels well. ANC remains low (0.4) and TKI has been on hold. After treating CMV and switching antiviral to letermovir without improving the ANC, our plan is to switch TKI from dasatinib to ponatinib. Once ANC recovers to 1.0 or higher, will plan to proceed. In the meantime, will proceed with vincristine and prednisone.    We discussed birth control. The plan had been to start micronor s/p Lupron but she has not started. She is not currently sexually active due to pain with intercourse. Recommended she discuss with her GYN but recommend starting micronor now in case she is active in the future.    Continue monthly IVIG.    Ph+ALL, in remission: On GRAAPH 2005 + dasatinib. Completed 12/12 planned IT treatments for CNS ppx.  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 09/2022 restarted Valcyte with the goal of controlling CMV and limiting neutropenia  - continue letermovir  - weekly CMV levels    Hypogammaglobulinemia:   - Monthly IVIG started 07/26/22 - now at [facility] due to insurance coverage    Hemorrhoids, Anal fissure: persistent but stable, per patient 11/15/2022  - supportive care at home    Pancytopenia due to chemotherapy  - ANC 0.4, platelets 167K 11/15/2022  - supportive care as detailed below when in nadir    Menses suppression and birth control: Lupron  - dose 07/26/22   - start Micronor - as detailed in assessment notes above   Will need birth control while on chemo, including when on only TKI -     Psychosocial distress: She reports a moderate level of anxiety regarding the management of the above.   - Counseling given   - Consider ref to comprehensive cancer support program     Patient-centered care/Shared decision-making:   We discussed the plan above at length. The patient and her husband actively contributed to  the conversation. Specifically, her most important outcomes are:   Prolonging overall survival and Maintaining her overall functional activity     Supportive Care Needs: We recommend based on the patient???s underlying diagnosis and treatment history the following supportive care:    1. Antimicrobial prophylaxis:    Viral - valacyclovir and letermovir  Bacterial: Levofloxacin when ANC <0.5  Fungal: Fluconazole 200mg  when ANC <0.5  PJP: dapsone 100mg  daily    2. Blood product support:  Leukoreduced blood products are required.  Irradiated blood products are preferred, but in case of urgent transfusion needs non-irradiated blood products may be used: 2 units for Hg <=8.0.     Coordination of care:   - Continue maintenance q4 weeks   - lab check in 2 weeks      [name], AGPCNP-BC  Nurse Practitioner  Hematology/Oncology  [facility]    [name], MD was available  Leukemia Program  Division of Hematology  [facility]      Nurse Navigator (non-clinical trial patients): Elicia Lamp, RN        Tel. [phone]       Fax. [phone]  Toll-free appointments: [phone]  Scheduling assistance: [phone]  After hours/weekends: [phone] (ask for adult hematology/oncology on-call)      History of Present Illness:   Patricia [name] is a 47 y.o. female with past medical history noted as above who presents for follow up Ph+ ALL.      Her social history is notable for being a Runner, broadcasting/film/video.  She was recently married. She lives with her husband.    Interim history  Since her last visit:  Has been feeling great.  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 Fingertips are not tingling currently.  No fevers/chills.  No nausea/vomiting.      Oncology History is as below:   Hematology/Oncology History Overview Note   Referring/Local Oncologist:    Diagnosis:   10/29/2021  Bone marrow, left iliac, aspiration and biopsy  -  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 The majority of the abnormal cells (64/88) contained an additional BCR/ABL1 fusion signal, while 9/88 abnormal cells contained an additional ABL1 and ASS1 signal.       Genetics:   Karyotype/FISH:   RESULTS   Date Value Ref Range Status   10/28/2021   Final    NOTE: This report reflects a combined study from a peripheral blood and a bone marrow core biopsy. Eleven cells from the peripheral blood and nine cells from the bone marrow core biopsy were analyzed. The BCR/ABL1 FISH analysis was performed on the peripheral blood.     Abnormal Karyotype:  47,XX,t(9;22)(q34;q11.2),+der(22)t(9;22)[18]/46,XX[2]    Abnormal FISH:  A BCR/ABL1 interphase FISH assay showed a signal pattern consistent with a BCR::ABL1 rearrangement and the 9;22 translocation in 88% of the 100 cells scored. The majority of the abnormal cells (64/88) contained an additional BCR/ABL1 fusion signal, while 9/88 abnormal cells contained an additional ABL1 and ASS1 signal.  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  MRD by flow insufficient, p190 2/100,000  12/10/2021: Cycle 2 B cycle + rituximab  12/14/21: IT#4  12/17/21: IT#5  01/10/22: Cycle 3 A cycle + rituximab  01/11/22: IT #6  01/24/22: Bmbx - MRD-neg by PCR (p190)   02/17/22: C4 B cycle + ritux  02/18/22: IT#7  03/21/22: C5 A cycle + ritux (4th dose)  03/22/22: IT#8  04/29/22: Bmbx - MRD-neg by PCR (p190)   05/18/22: restart dasatinib 70mg  s/p neutropenia  05/26/22: Start maintenance vincristine 2g, Prednisone 200mg  D1-5   06/02/22: IT #9  06/27/22: C2 Maintenance: vincristine 2g, Prednisone 200mg  D1-5, HOLD dasatinib due to neutropenia  07/04/22: RESTART dasatinib 70mg   07/21/22: IT #10  07/26/22: C3 Maintenance: vincristine 2g, Prednisone 200mg  D1-5, dasatinib 70 mg  08/08/22: IT #11 - prophylaxis completed  08/23/22: C4 Maintenance: vincristine 2g, Prednisone 200mg  D1-5, dasatinib 70 mg  09/20/22: C5 Maintenance: vincristine 2g, Prednisone 200mg  D1-5, dasatinib 70 mg  10/04/22: Bone marrow biopsy - continued remission, MRD and BCR/ABL p190 negative  10/18/22: C6 Maintenance. ANC 0.4, HOLD dasatinib, continue vincristine 2g and Prednisone 200mg  D1-5  11/15/22: C7 Maintenance ANC 0.4. HOLD TKI, continue vincristine 2g and Prednisone 200mg  D1-5         Review of Systems:   ROS reviewed and negative except as noted in H and P     Allergies:  Allergies   Allergen Reactions    Erythromycin Hives     Other reaction(s): Not available    Other      Pt cant take ibuprofen due to condition.    Sulfa (Sulfonamide Antibiotics) Anaphylaxis     Other reaction(s): Not available    Azithromycin        Medications:     Current Outpatient Medications:     acetaminophen (TYLENOL 8 HOUR) 650 MG CR tablet, Take 2 tablets (1,300 mg total) by mouth every eight (8) hours as needed for pain., Disp: , Rfl:     albuterol HFA 90 mcg/actuation inhaler, Inhale 2 puffs every six (6) hours as needed for wheezing., Disp: , Rfl:     cetirizine (ZYRTEC) 10 MG tablet, Take 1 tablet (10 mg total) by mouth nightly., Disp: 30 tablet, Rfl: 2    dapsone 100 MG tablet, TAKE 1 TABLET(100 MG) BY MOUTH DAILY, Disp: 30 tablet, Rfl: 11    escitalopram oxalate (LEXAPRO) 10 MG tablet, Take 1 tablet (10 mg total) by mouth daily., Disp: 90 tablet, Rfl: 0    fluconazole (DIFLUCAN) 200 MG tablet, Take 1 tablet (200 mg total) by mouth daily., Disp: 30 tablet, Rfl: 2    letermovir (PREVYMIS) 480 mg tablet, Take 1 tablet (480 mg total) by mouth daily., Disp: 28 tablet, Rfl: 2    levalbuterol (XOPENEX) 0.31 mg/3 mL nebulizer solution, Inhale 3 mL (0.31 mg total) by nebulization every four (4) hours as needed for wheezing (cough)., Disp: 45 mL, Rfl: 0    levoFLOXacin (LEVAQUIN) 500 MG tablet, Take 1 tablet (500 mg total) by mouth daily., Disp: 30 tablet, Rfl: 2    tizanidine (ZANAFLEX) 4 MG tablet, Take 1 tablet (4 mg total) by mouth nightly., Disp: 90  tablet, Rfl: 2    triamcinolone (KENALOG) 0.1 % cream, Apply topically two (2) times a day for 14 days., Disp: 45 g, Rfl: 0    valACYclovir (VALTREX) 500 MG tablet, Take 1 tablet (500 mg total) by mouth daily., Disp: 30 tablet, Rfl: 12    PONATinib (ICLUSIG) 15 mg tablet, Take 1 tablet (15 mg total) by mouth daily. Swallow tablets whole. Do not crush, break, cut or chew tablets. (Patient not taking: Reported on 11/15/2022), Disp: 30 tablet, Rfl: 5    predniSONE (DELTASONE) 50 MG tablet, Take 4 tablets (200 mg total) by mouth once daily on days 1-5 of each cycle., Disp: 20 tablet, Rfl: 5  No current facility-administered medications for this visit.    Facility-Administered Medications Ordered in Other Visits:     dexAMETHasone (DECADRON) 4 mg/mL injection 20 mg, 20 mg, Intravenous, Once PRN, Lenon Ahmadi, AGNP    diphenhydrAMINE (BENADRYL) injection 25 mg, 25 mg, Intravenous, Once PRN, Lenon Ahmadi, AGNP    EPINEPHrine [facility]) injection 0.3 mg, 0.3 mg, Intramuscular, Once PRN, Lenon Ahmadi, AGNP    famotidine (PF) (PEPCID) injection 20 mg, 20 mg, Intravenous, Once PRN, Lenon Ahmadi, AGNP    methylPREDNISolone sodium succinate (PF) (SOLU-Medrol) injection 125 mg, 125 mg, Intravenous, Once PRN, Dellis Anes, Ursula Alert, AGNP    OKAY TO SEND MEDICATION/CHEMOTHERAPY TO OUTPATIENT UNIT, , Other, Once, Lenon Ahmadi, AGNP    prochlorperazine (COMPAZINE) injection 10 mg, 10 mg, Intravenous, Q6H PRN, Lenon Ahmadi, AGNP    prochlorperazine (COMPAZINE) tablet 10 mg, 10 mg, Oral, Q6H PRN, Lenon Ahmadi, AGNP    sodium chloride (NS) 0.9 % infusion, 100 mL/hr, Intravenous, Continuous, Lenon Ahmadi, AGNP    sodium chloride (NS) 0.9 % infusion, 20 mL/hr, Intravenous, Continuous PRN, Lenon Ahmadi, AGNP    sodium chloride 0.9% (NS) bolus 1,000 mL, 1,000 mL, Intravenous, Once PRN, Lenon Ahmadi, AGNP    vinCRIStine (ONCOVIN) 2 mg in sodium chloride (NS) 0.9 % 25 mL IVPB, 2 mg, Intravenous, Once, Lenon Ahmadi, AGNP    Medical History:  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    EBV infection        Social History:  Social History     Social History Narrative    Not on file       Family History:  No family history on file.    Objective:   BP 121/92  - Pulse 93  - Temp 36.9 ??C (98.5 ??F) (Oral)  - Resp 15  - Ht 167.6 cm (5' 5.97)  - Wt 85.5 kg (188 lb 7.9 oz)  - SpO2 98%  - BMI 30.45 kg/m??     Physical Exam:  GENERAL: well-appearing  woman   HEART: Normal color, not excessive pallor.  CHEST/LUNG: Normal work of breathing. No dyspnea with conversation. No crackles. Regular rate/rhythm  ABDOMEN: No obvious distention.    EXTREMITIES: No edema, cyanosis or clubbing.   SKIN: no rashes noted  NEURO EXAM: Grossly intact.       Test Results:  11/15/2022 CBC/d and CMP reviewed with patient

## 2022-11-15 NOTE — Unmapped (Signed)
Patient arrived to chair 15. Labs within parameter for tx today. Port was flushed, + BR. Pt. completed and tolerated tx well. Port was flushed, +BR. Port was de-accessed and patient discharged with no additional needs.

## 2022-11-15 NOTE — Unmapped (Signed)
Outpatient Oncology Social Work  Follow Up       Psychosocial & Case Management Update: SW received a pager from the [facility]. The patient was asking to see the SW. SW used active listening as the patient discussed some things she has been working on. The patient stated that she is working on grants because she wants to be able to give resources to cancer patients and their care givers. SW stated that she will ask to see who the patient could potentially talk to about what she is trying to do.     The patient has SW information and will call with any questions/concerns. SW will remain available to provide additional support, information, and resources as needed.      Mareesa Gathright H. Mayford Knife, MSW, LCSW  Oncology Outpatient Social Worker  [phone]

## 2022-11-15 NOTE — Unmapped (Signed)
Lab on 11/15/2022   Component Date Value Ref Range Status    Sodium 11/15/2022 142  135 - 145 mmol/L Final    Potassium 11/15/2022 3.9  3.4 - 4.8 mmol/L Final    Chloride 11/15/2022 106  98 - 107 mmol/L Final    CO2 11/15/2022 27.0  20.0 - 31.0 mmol/L Final    Anion Gap 11/15/2022 9  5 - 14 mmol/L Final    BUN 11/15/2022 14  9 - 23 mg/dL Final    Creatinine 16/05/9603 0.68  0.55 - 1.02 mg/dL Final    BUN/Creatinine Ratio 11/15/2022 21   Final    eGFR CKD-EPI (2021) Female 11/15/2022 >90  >=60 mL/min/1.13m2 Final    eGFR calculated with CKD-EPI 2021 equation in accordance with [employer] and [employer] of Nephrology Task Force recommendations.    Glucose 11/15/2022 121  70 - 179 mg/dL Final    Calcium 54/04/8118 9.7  8.7 - 10.4 mg/dL Final    Albumin 14/78/2956 4.0  3.4 - 5.0 g/dL Final    Total Protein 11/15/2022 6.7  5.7 - 8.2 g/dL Final    Total Bilirubin 11/15/2022 0.4  0.3 - 1.2 mg/dL Final    AST 21/30/8657 36 (H)  <=34 U/L Final    ALT 11/15/2022 34  10 - 49 U/L Final    Alkaline Phosphatase 11/15/2022 53  46 - 116 U/L Final    WBC 11/15/2022 1.9 (L)  3.6 - 11.2 10*9/L Final    RBC 11/15/2022 3.13 (L)  3.95 - 5.13 10*12/L Final    HGB 11/15/2022 12.4  11.3 - 14.9 g/dL Final    HCT 84/69/6295 34.5  34.0 - 44.0 % Final    MCV 11/15/2022 109.9 (H)  77.6 - 95.7 fL Final    MCH 11/15/2022 39.4 (H)  25.9 - 32.4 pg Final    MCHC 11/15/2022 35.8  32.0 - 36.0 g/dL Final    RDW 28/41/3244 13.8  12.2 - 15.2 % Final    MPV 11/15/2022 8.0  6.8 - 10.7 fL Final    Platelet 11/15/2022 179  150 - 450 10*9/L Final    Neutrophils % 11/15/2022 18.2  % Final    Lymphocytes % 11/15/2022 56.2  % Final    Monocytes % 11/15/2022 22.0  % Final    Eosinophils % 11/15/2022 2.8  % Final    Basophils % 11/15/2022 0.8  % Final    Absolute Neutrophils 11/15/2022 0.4 (LL)  1.8 - 7.8 10*9/L Final    Absolute Lymphocytes 11/15/2022 1.1  1.1 - 3.6 10*9/L Final    Absolute Monocytes 11/15/2022 0.4  0.3 - 0.8 10*9/L Final    Absolute Eosinophils 11/15/2022 0.1  0.0 - 0.5 10*9/L Final    Absolute Basophils 11/15/2022 0.0  0.0 - 0.1 10*9/L Final    Macrocytosis 11/15/2022 Slight (A)  Not Present Final

## 2022-11-15 NOTE — Unmapped (Addendum)
Your ANC is too low to start ponatinib.  Proceed with vincristine today and start 5 days of Prednisone.    IVIG will be 2/24 at [facility].    Plan for a lab check in 2 weeks.    I'll see you back in 4 weeks for the next cycle.

## 2022-11-23 ENCOUNTER — Ambulatory Visit: Admit: 2022-11-23 | Discharge: 2022-11-24 | Payer: PRIVATE HEALTH INSURANCE

## 2022-11-23 MED ADMIN — diphenhydrAMINE (BENADRYL) capsule/tablet 25 mg: 25 mg | ORAL | @ 18:00:00 | Stop: 2022-11-23

## 2022-11-23 MED ADMIN — immun glob G(IgG)-pro-IgA 0-50 (PRIVIGEN) 10 % intravenous solution 30 g: .4 g/kg | INTRAVENOUS | @ 18:00:00 | Stop: 2022-11-23

## 2022-11-23 MED ADMIN — heparin, porcine (PF) 100 unit/mL injection 500 Units: 500 [IU] | INTRAVENOUS | @ 20:00:00 | Stop: 2022-11-23

## 2022-11-23 MED ADMIN — acetaminophen (TYLENOL) tablet 650 mg: 650 mg | ORAL | @ 18:00:00 | Stop: 2022-11-23

## 2022-11-23 MED FILL — PREVYMIS 480 MG TABLET: ORAL | 28 days supply | Qty: 28 | Fill #1

## 2022-11-23 NOTE — Unmapped (Signed)
Pt presents for IVIG.  VSS, pt weight is 84.6kg.  Lateral port accessed, premeds administered.  Pt aware of potential reaction/side effects, call bell within reach.    1412 IVIG 30g started, to infuse at the following rates:  33ml/hr for 15 min  50ml/hr for 30 min  138ml/hr for 30 min  468ml/hr until complete.    1555 IVIG complete.  Pt tolerated without complication, VSS.  Port flushed, heparin locked per policy, and d/c'd, gauze and coban applied.  Pt left clinic in no acute distress.

## 2022-11-28 ENCOUNTER — Institutional Professional Consult (permissible substitution): Admit: 2022-11-28 | Discharge: 2022-11-29 | Payer: PRIVATE HEALTH INSURANCE

## 2022-11-28 DIAGNOSIS — C9100 Acute lymphoblastic leukemia not having achieved remission: Principal | ICD-10-CM

## 2022-11-28 LAB — COMPREHENSIVE METABOLIC PANEL
ALBUMIN: 3.8 g/dL (ref 3.4–5.0)
ALKALINE PHOSPHATASE: 49 U/L (ref 46–116)
ALT (SGPT): 25 U/L (ref 10–49)
ANION GAP: 8 mmol/L (ref 5–14)
AST (SGOT): 26 U/L (ref <=34)
BILIRUBIN TOTAL: 0.5 mg/dL (ref 0.3–1.2)
BLOOD UREA NITROGEN: 17 mg/dL (ref 9–23)
BUN / CREAT RATIO: 25
CALCIUM: 9.6 mg/dL (ref 8.7–10.4)
CHLORIDE: 108 mmol/L — ABNORMAL HIGH (ref 98–107)
CO2: 25.1 mmol/L (ref 20.0–31.0)
CREATININE: 0.67 mg/dL
EGFR CKD-EPI (2021) FEMALE: >90 mL/min/{1.73_m2} (ref >=60)
GLUCOSE RANDOM: 166 mg/dL (ref 70–179)
POTASSIUM: 3.5 mmol/L (ref 3.4–4.8)
PROTEIN TOTAL: 6.8 g/dL (ref 5.7–8.2)
SODIUM: 141 mmol/L (ref 135–145)

## 2022-11-28 LAB — CBC W/ AUTO DIFF
BASOPHILS ABSOLUTE COUNT: 0 10*9/L (ref 0.0–0.1)
BASOPHILS RELATIVE PERCENT: 1.1 %
EOSINOPHILS ABSOLUTE COUNT: 0 10*9/L (ref 0.0–0.5)
EOSINOPHILS RELATIVE PERCENT: 1.4 %
HEMATOCRIT: 36.2 % (ref 34.0–44.0)
HEMOGLOBIN: 12.8 g/dL (ref 11.3–14.9)
LYMPHOCYTES ABSOLUTE COUNT: 0.9 10*9/L — ABNORMAL LOW (ref 1.1–3.6)
LYMPHOCYTES RELATIVE PERCENT: 39.1 %
MEAN CORPUSCULAR HEMOGLOBIN CONC: 35.4 g/dL (ref 32.0–36.0)
MEAN CORPUSCULAR HEMOGLOBIN: 38 pg — ABNORMAL HIGH (ref 25.9–32.4)
MEAN CORPUSCULAR VOLUME: 107.4 fL — ABNORMAL HIGH (ref 77.6–95.7)
MEAN PLATELET VOLUME: 8.5 fL (ref 6.8–10.7)
MONOCYTES ABSOLUTE COUNT: 0.3 10*9/L (ref 0.3–0.8)
MONOCYTES RELATIVE PERCENT: 15.3 %
NEUTROPHILS ABSOLUTE COUNT: 1 10*9/L — ABNORMAL LOW (ref 1.8–7.8)
NEUTROPHILS RELATIVE PERCENT: 43.1 %
NUCLEATED RED BLOOD CELLS: 0 /100{WBCs} (ref <=4)
PLATELET COUNT: 131 10*9/L — ABNORMAL LOW (ref 150–450)
RED BLOOD CELL COUNT: 3.37 10*12/L — ABNORMAL LOW (ref 3.95–5.13)
RED CELL DISTRIBUTION WIDTH: 13.5 % (ref 12.2–15.2)
WBC ADJUSTED: 2.3 10*9/L — ABNORMAL LOW (ref 3.6–11.2)

## 2022-11-28 LAB — LIPASE: LIPASE: 68 U/L — ABNORMAL HIGH (ref 12–53)

## 2022-11-28 LAB — CMV DNA, QUANTITATIVE, PCR: CMV VIRAL LD: NOT DETECTED

## 2022-11-28 LAB — AMYLASE: AMYLASE: 136 U/L — ABNORMAL HIGH (ref 30–118)

## 2022-11-28 MED ADMIN — heparin, porcine (PF) 100 unit/mL injection 500 Units: 500 [IU] | INTRAVENOUS | @ 17:00:00 | Stop: 2022-11-28

## 2022-11-28 NOTE — Unmapped (Signed)
Clinical Support on 11/28/2022   Component Date Value Ref Range Status    WBC 11/28/2022 2.3 (L)  3.6 - 11.2 10*9/L Final    RBC 11/28/2022 3.37 (L)  3.95 - 5.13 10*12/L Final    HGB 11/28/2022 12.8  11.3 - 14.9 g/dL Final    HCT 16/05/9603 36.2  34.0 - 44.0 % Final    MCV 11/28/2022 107.4 (H)  77.6 - 95.7 fL Final    MCH 11/28/2022 38.0 (H)  25.9 - 32.4 pg Final    MCHC 11/28/2022 35.4  32.0 - 36.0 g/dL Final    RDW 54/04/8118 13.5  12.2 - 15.2 % Final    MPV 11/28/2022 8.5  6.8 - 10.7 fL Final    Platelet 11/28/2022 131 (L)  150 - 450 10*9/L Final    nRBC 11/28/2022 0  <=4 /100 WBCs Final    Neutrophils % 11/28/2022 43.1  % Final    Lymphocytes % 11/28/2022 39.1  % Final    Monocytes % 11/28/2022 15.3  % Final    Eosinophils % 11/28/2022 1.4  % Final    Basophils % 11/28/2022 1.1  % Final    Absolute Neutrophils 11/28/2022 1.0 (L)  1.8 - 7.8 10*9/L Final    Absolute Lymphocytes 11/28/2022 0.9 (L)  1.1 - 3.6 10*9/L Final    Absolute Monocytes 11/28/2022 0.3  0.3 - 0.8 10*9/L Final    Absolute Eosinophils 11/28/2022 0.0  0.0 - 0.5 10*9/L Final    Absolute Basophils 11/28/2022 0.0  0.0 - 0.1 10*9/L Final

## 2022-11-28 NOTE — Unmapped (Signed)
I called Patricia [name] to let her know that since her ANC is now 1.0 (PLT and Hgb also stable) we will go ahead and start ponatinib 15 mg daily.    Plan:  - START ponatinib 15 mg daily. Will send from [facility]  - START aspirin 81 mg daily (hold if PLT < 50)  - Can stop levofloxacin and fluconazole since ANC > 0.5  - Continue letermovir, dapsone, and valtrex      Manfred Arch, PharmD, BCOP, CPP  Pager: [phone]

## 2022-12-01 NOTE — Unmapped (Signed)
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  I am speaking to the patient.    Was a Nurse, learning disability used for this call? No    Verified patient's date of birth / HIPAA.    Specialty medication(s) to be sent: Hematology/Oncology: Iclusig    Non-specialty medications/supplies to be sent: none    Medications not needed at this time: none     Iclusig (ponatinib)    The patient declined counseling on missed dose instructions, goals of therapy, side effects and monitoring parameters, warnings and precautions, drug/food interactions, and storage, handling precautions, and disposal because they were counseled in clinic. The information in the declined sections below are for informational purposes only and was not discussed with patient.     Medication & Administration     Dosage: Take 1 tablet (15 mg total) by mouth daily. Swallow tablets whole. Do not crush, break, cut or chew tablets.    Administration:   May be administered with or without food.  Swallow tablets whole; do not break, crush or chew or dissolve.      Adherence/Missed dose instructions: If you miss a dose, skip the dose and go back to your normal time.  Do not take 2 doses at the same time.  Do not take extra doses.    Goals of Therapy     To prevent disease progression    Side Effects & Monitoring Parameters     Commonly reported side effects  Fatigue, loss of strength and energy  Nausea, vomiting  Diarrhea, constipation, abdominal pain  Common cold symptoms, nose and throat irritation  Lack of appetite, weight loss  Dry skin  Joint pain, bone pain, muscle pain/spasm  Restlessness, difficulty seeping  Hair loss    The following side effects should be reported to the provider:  Signs of infection (fever >100.4, chills, mouth sores/irritation, sputum production)  Signs of hypertension (severe headache, severe dizziness, chest pain, confusion, difficulty breathing, visual changes)  Signs of pancreatitis (severe abdominal pain, severe back pain, severe nausea or vomiting)  Signs of bleeding (vomiting or coughing up blood, blood that looks like coffee grounds, blood in the urine or black, red tarry stools, bruising that gets bigger without reason, any persistent or severe bleeding, impaired wound healing)  Signs of blood clots (numbness or weakness on one side of the body, pain, redness, tenderness, warmth, or swelling in the arms or legs, change in color of an arm of leg, chest pain, shortness of breath, fast heartbeat)  Signs of heart problems (cough, shortness of breath that is new or worse, swelling in ankles or legs, weight gain >5lbs in 24 hours, dizziness or passing out, abnormal heart beat, fast heartbeat, slow heartbeat)  Signs of liver problems (dark urine, abdominal pain, light-colored stools, vomiting, yellow skin or eyes)  Signs of cerebrovascular disease (change in strength on one side is greater than the other, trouble speaking or thinking, change in balance or vision changes)  Signs of fluid and electrolyte problems (mood changes, confusion, muscle pain or weakness, abnormal/fast heartbeat, severe dizziness, passing out)  Signs of high blood sugar (confusion, fatigue, increased thirst, passing a lot of urine, flushing, fast breathing or breath that smells like fruit.  Pale extremities, blue/gray skin discoloration of extremities  Signs of vision changes (eye pain, severe eye irritation, sensitivity to light, dry eye, floater in the eye)  Abdominal swelling  Signs of nerve problems, like sensitivity to heat or cold; decreased sense of touch; burning, numbness, or tingling; pain, or weakness in the arms, hands, legs, or feet)  Signs of posterior reversible encephalopathy syndrome like confusion, not alert, vision changes, seizures, or severe headache  Signs of tumor lysis syndrome like fast heartbeat or abnormal heartbeat; any passing out; unable to pass urine; muscle weakness or cramps; nausea, vomiting, diarrhea or lack of appetite; or feeling sluggish  Signs of anaphylaxis (wheezing, chest tightness, swelling of face, lips, tongue or throat)    Monitoring Parameters:   CBC with differential and platelets every 2 weeks for the first 3 months, then monthly or as clinically needed;   Liver function tests at baseline and at least monthly thereafter or more frequently if clinically warranted  Serum lipase every 2 weeks for the first 2 months and monthly thereafter (more frequently in patients with a history of pancreatitis or alcohol abuse)  Serum electrolytes and uric acid;   Cardiac function   Blood pressure  Signs/symptoms of arterial/venous occlusion or thromboembolism, hemorrhage, fluid retention, pancreatitis (clinical signs), gastrointestinal perforation/fistula, hepatotoxicity (jaundice, anorexia, bleeding, bruising), reversible posterior leukoencephalopathy syndrome, neuropathy  Comprehensive ocular exam at baseline and periodically  Adherence    Contraindications, Warnings, & Precautions     [US Boxed Warning]: Arterial occlusions have occurred in at least 35% of ponatinib-treated patients. Some patients experienced more than 1 type of event. Events included fatal myocardial infarction (MI), stroke, stenosis of large arterial vessels of the brain, severe peripheral vascular disease, and the need for urgent revascularization procedures; incidents were observed in patients with and without cardiovascular risk factors (including patients ?46 years of age). Monitor closely for arterial occlusion; interrupt or discontinue therapy immediately for arterial occlusion. Consider risk: benefit ratio when deciding to restart therapy     [US Boxed Warning]: Liver failure and death resulting from ponatinib-induced hepatotoxicity were observed; monitor liver function prior to and at least monthly (or as clinically indicated) during treatment. The median time to onset was 3 months (range: less than 1 month to 47 months). Hepatotoxicity may require treatment interruption (followed by dose reduction) or discontinuation.     [US Boxed Warning]: Serious heart failure (HF) or left ventricular dysfunction, including fatalities, were reported in clinical trials. Monitor for signs/symptoms of HF; interrupt or discontinue ponatinib therapy for new or worsening HF. The most commonly reported heart failure events were congestive heart failure and decreased ejection fraction. Treat as clinically warranted if HF develops. Consider ponatinib discontinuation in the event of serious HF.    [US Boxed Warning]: Venous occlusive events have occurred in 6% of ponatinib-treated patients. Monitor for evidence of venous thromboembolism. Consider dose modification or discontinuation of ponatinib in patients who develop serious venous thromboembolism. Venous thromboembolism, including deep vein thrombosis, pulmonary embolism, superficial thrombophlebitis, and retinal vein thrombosis have been reported.    Bone marrow suppression: Severe myelosuppression (grade 3 or 4) is commonly   observed with ponatinib, and the incidence was greater in patients with accelerated or blast phase CML and Ph+ ALL. The median onset to severe myelosuppression was 1 month (range: up to 40 months).     Fluid retention/edema: Serious fluid retention events, including fatality due to brain edema (case report), were observed in ponatinib-treated patients. Peripheral edema, pleural effusions, pericardial effusions, and peripheral swelling were commonly seen.    GI perforation: Serious GI perforation (fistula) occurred very rarely; monitor for signs/symptoms of perforation and/or fistula. Permanently discontinue if GI  perforation occurs.    Hemorrhage: Hemorrhagic events occurred in ponatinib-treated patients, including serious events such as cerebral (subdural hematoma) and GI hemorrhages; fatalities were reported. Serious bleeding episodes occurred more frequently in patients with accelerated or blast phase CML, and Ph+ ALL; most patients had grade 4 thrombocytopenia.    Hypertension: Treatment-emergent blood pressure elevations (systolic or diastolic) developed in over two-thirds of ponatinib-treated patients; symptomatic hypertension or hypertensive crisis were reported in several patients, requiring urgent intervention. Blood pressure may worsen in patients with preexisting hypertension    Arrhythmias: Cardiac arrhythmias (bradyarrhythmias and tachyarrhythmias) have been reported. The most commonly reported arrhythmia was atrial fibrillation; ~50% of events were grade 3 or 4. Other grade 3 or 4 rhythm disorders have occurred (case reports).     Neuropathy: Peripheral and cranial neuropathy have been reported. Peripheral neuropathy, paresthesia, hypoesthesia, hyperesthesia, dysgeusia, and muscular weakness occurred most frequently; cranial neuropathy occurred rarely. In one-quarter of patients who experienced symptoms, neuropathy developed during the first month of therapy.     Ocular toxicity: Serious ocular events such as blindness and blurred vision have occurred with ponatinib use. Macular edema, retinal vein occlusion, and retinal hemorrhage have been reported in a small percentage of patients; conjunctival irritation, corneal erosion or abrasion, dry eye, conjunctivitis, conjunctival hemorrhage, hyperaemia and edema, or eye pain occurred more frequently. Other toxicities include cataracts, periorbital edema, blepharitis, glaucoma, eyelid edema, ocular hyperaemia, iritis, iridocyclitis, and ulcerative keratitis.     Pancreatitis: Treatment-related lipase elevations and clinical pancreatitis occurred in clinical studies, including grade 3 and 4 events. The median time to onset was 14 days (range: 3 days to ~48 months); the majority of cases resolved within 2 weeks of therapy interruption or dose reduction. Reversible posterior leukoencephalopathy syndrome: RPLS has been reported in post-marketing surveillance.     Tumor lysis syndrome: Hyperuricemia and serious tumor lysis syndrome (rare) were reported. Patients should receive adequate hydration and be monitored for elevated uric acid levels and/or the development of tumor lysis syndrome. Manage elevated uric acid levels prior to initiating therapy.    Wound healing impairment: As ponatinib inhibits vascular endothelial growth factor activity, therapy may impair wound healing. Hold therapy for at least 1 week prior to elective surgery; resume therapy ?2 weeks following major surgery and until adequate wound healing.    Cardiovascular disease: Patients with or without cardiovascular risk factors, and those with a prior history of ischemia, hypertension, diabetes, or hyperlipidemia may be at increased risk for vascular occlusion when treated with ponatinib. \    Ponatinib is not indicated and not recommended for treatment of newly diagnosed chronic phase CML.    Hepatic impairment: A single-dose (30 mg) pharmacokinetic study found that ponatinib exposure was not increased in patients with hepatic impairment (Child-Pugh class A, B, or C) as compared to patients with normal hepatic function. Monitor closely when administering to patients with impaired hepatic function. The starting dose should be reduced in patients with hepatic impairment.    Patients' ?46 years of age may be more likely to experience vascular occlusion, weakness, decreased appetite, dyspnea, increased lipase, muscle spasms, peripheral edema, and thrombocytopenia; monitor closely. Cautious dose selection is recommended based on greater frequency of decreased hepatic, renal, or cardiac function, and of concomitant disease or other drug therapy.    Reproductive concerns Verify pregnancy status prior to initiating ponatinib treatment. Women of childbearing potential should use effective contraception during treatment and for 3 weeks after the last dose.  Based on animal data and its mechanism of action,  ponatinib is expected to cause fetal harm if used during pregnancy.    Breastfeeding concerns: It is not known if ponatinib is excreted in breast milk. Due to the potential for serious adverse reactions in the nursing infant, the manufacturer recommends that breast-feeding be discontinued during therapy and for 6 days after the last dose.  Drug/Food Interactions     Medication list reviewed in Epic. The patient was instructed to inform the care team before taking any new medications or supplements. No drug interactions identified  Reduce ponatinib dose to 30 mg once daily when administered with concomitant strong CYP3A inhibitors  Avoid grapefruit and grapefruit juice (may increase serum concentrations of ponatinib)    Storage, Handling Precautions, & Disposal   Store at room temperature in the original container (do not use a pillbox or store with other medications).   Caregivers helping administer medication should wear gloves and wash hands immediately after.    Keep the lid tightly closed. Keep out of the reach of children and pets.  Do not flush down a toilet or pour down a drain unless instructed to do so.  Check with your local police department or fire station about drug take-back programs in your area.      Current Medications (including OTC/herbals), Comorbidities and Allergies     Current Outpatient Medications   Medication Sig Dispense Refill    acetaminophen (TYLENOL 8 HOUR) 650 MG CR tablet Take 2 tablets (1,300 mg total) by mouth every eight (8) hours as needed for pain.      albuterol HFA 90 mcg/actuation inhaler Inhale 2 puffs every six (6) hours as needed for wheezing.      aspirin (ECOTRIN) 81 MG tablet Take 1 tablet (81 mg total) by mouth daily.      cetirizine (ZYRTEC) 10 MG tablet Take 1 tablet (10 mg total) by mouth nightly. 30 tablet 2    dapsone 100 MG tablet TAKE 1 TABLET(100 MG) BY MOUTH DAILY 30 tablet 11    escitalopram oxalate (LEXAPRO) 10 MG tablet Take 1 tablet (10 mg total) by mouth daily. 90 tablet 0    letermovir (PREVYMIS) 480 mg tablet Take 1 tablet (480 mg total) by mouth daily. 28 tablet 2    levalbuterol (XOPENEX) 0.31 mg/3 mL nebulizer solution Inhale 3 mL (0.31 mg total) by nebulization every four (4) hours as needed for wheezing (cough). 45 mL 0    norethindrone (MICRONOR) 0.35 mg tablet Take 1 tablet by mouth daily. 84 tablet 3    PONATinib (ICLUSIG) 15 mg tablet Take 1 tablet (15 mg total) by mouth daily. Swallow tablets whole. Do not crush, break, cut or chew tablets. (Patient not taking: Reported on 11/15/2022) 30 tablet 5    predniSONE (DELTASONE) 50 MG tablet Take 4 tablets (200 mg total) by mouth once daily on days 1-5 of each cycle. 20 tablet 5    tizanidine (ZANAFLEX) 4 MG tablet Take 1 tablet (4 mg total) by mouth nightly. 90 tablet 2    triamcinolone (KENALOG) 0.1 % cream Apply topically two (2) times a day for 14 days. 45 g 0    valACYclovir (VALTREX) 500 MG tablet Take 1 tablet (500 mg total) by mouth daily. 30 tablet 12     No current facility-administered medications for this visit.       Allergies   Allergen Reactions    Erythromycin Hives     Other reaction(s): Not available    Other      Pt cant take  ibuprofen due to condition.    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)    Acute cough    Dyspepsia    Chronic frontal sinusitis    Pancytopenia (CMS-HCC)    Neutropenia with fever (CMS-HCC)    Blurred vision, bilateral    Anxiety    Hypogammaglobulinemia (CMS-HCC)    Hypokalemia    Acute tonsillitis    Allergic conjunctivitis    Chest pain with low risk for cardiac etiology    Chronic back pain    Chronic pansinusitis    Depression    Dog bite    Generalized anxiety disorder    Heart palpitations    Hydronephrosis, right    Hypertension    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    Appropriateness of Therapy     Acute infections noted within Epic:  No active infections  Patient reported infection: None    Is medication and dose appropriate based on diagnosis and infection status? Yes    Prescription has been clinically reviewed: Yes    Patient-Reported Symptoms Tracker for Cancer Patients on Oral Chemotherapy     Oral chemotherapy medication name(s): Iclusig  Dose and frequency: 15 mg once daily  Oral Chemotherapy Start Date:    Baseline? Yes  Clinic(s) visited: Hematology    Symptom Grouping Question Patient Response   Digestion and Eating Have you felt sick to your stomach? Did not assess    Had diarrhea? Did not assess    Constipated? Did not assess    Not wanting to eat? Did not assess    Comments      Sleep and Pain Felt very tired even after you rest? Did not assess    Pain due to cancer medication or cancer? Did not assess    Comments     Other Side Effects Numbness or tingling in hands and/or feet? Did not assess    Felt short of breath? Did not assess    Mouth or throat Sores? Did not assess    Rash?      Palmar-plantar erythrodysesthesia syndrome?      Rash - acneiform?      Rash - maculo-papular?      How many days over the past month did your cancer medication or cancer keep you from your normal activities?  Write in number of days, 0-30:       Other side effects or things you would like to discuss? No - Discussed medication with CPP during appointment and did not have any questions/concerns at this time    Comments?     Adherence  In the last 30 days, on how many days did you miss at least one dose of any of your [drug name]? Write in number of days, 0-30:       What reasons are you having trouble taking your medication [pharmacist: check all that apply]? Specify chemotherapy cycle:        No problems identified    Comments:  Comments       Optional Symptom Tracking Comments: Patient declined counseling - was already counseled in clinic with CPP    Financial Information     Medication Assistance provided: Prior Authorization    Anticipated copay of $0 / 30 days reviewed with patient. Verified delivery address.    Delivery Information     Scheduled delivery date: 12/05/22    Expected start date: ASAP    Medication will be delivered via Same Day Courier to the prescription address in [facility].  This shipment will not require a signature.      Explained the services we provide at [facility] and that each month we would call to set up refills.  Stressed importance of returning phone calls so that we could ensure they receive their medications in time each month.  Informed patient that we should be setting up refills 7-10 days prior to when they will run out of medication.  A pharmacist will reach out to perform a clinical assessment periodically.  Informed patient that a welcome packet, containing information about our pharmacy and other support services, a Notice of Privacy Practices, and a drug information handout will be sent.      The patient or caregiver noted above participated in the development of this care plan and knows that they can request review of or adjustments to the care plan at any time.      Patient or caregiver verbalized understanding of the above information as well as how to contact the pharmacy at [phone] option 4 with any questions/concerns.  The pharmacy is open Monday through Friday 8:30am-4:30pm.  A pharmacist is available 24/7 via pager to answer any clinical questions they may have.    Patient Specific Needs     Does the patient have any physical, cognitive, or cultural barriers? No    Does the patient have adequate living arrangements? (i.e. the ability to store and take their medication appropriately) Yes    Did you identify any home environmental safety or security hazards? No    Patient prefers to have medications discussed with  Patient     Is the patient or caregiver able to read and understand education materials at a high school level or above? Yes    Patient's primary language is  English     Is the patient high risk? No    SOCIAL DETERMINANTS OF HEALTH     At the [facility], we have learned that life circumstances - like trouble affording food, housing, utilities, or transportation can affect the health of many of our patients.   That is why we wanted to ask: are you currently experiencing any life circumstances that are negatively impacting your health and/or quality of life? Patient declined to answer    Social Determinants of Health     Financial Resource Strain: Low Risk  (11/02/2021)    Overall Financial Resource Strain (CARDIA)     Difficulty of Paying Living Expenses: Not hard at all   Internet Connectivity: Not on file   Food Insecurity: No Food Insecurity (11/02/2021)    Hunger Vital Sign     Worried About Running Out of Food in the Last Year: Never true     Ran Out of Food in the Last Year: Never true   Tobacco Use: Medium Risk (10/26/2022)    Patient History     Smoking Tobacco Use: Former     Smokeless Tobacco Use: Never  Passive Exposure: Not on file   Housing/Utilities: Low Risk  (11/02/2021)    Housing/Utilities     Within the past 12 months, have you ever stayed: outside, in a car, in a tent, in an overnight shelter, or temporarily in someone else's home (i.e. couch-surfing)?: No     Are you worried about losing your housing?: No     Within the past 12 months, have you been unable to get utilities (heat, electricity) when it was really needed?: No   Alcohol Use: Unknown (09/10/2019)    Received from [facility] visits prior to 10/01/2022.    AUDIT-C     Frequency of Alcohol Consumption: Monthly or less     Average Number of Drinks: Not asked     Frequency of Binge Drinking: Not asked   Transportation Needs: No Transportation Needs (11/02/2021)    PRAPARE - Therapist, art (Medical): No     Lack of Transportation (Non-Medical): No   Substance Use: Not on file   Health Literacy: Not on file   Physical Activity: Not on file   Interpersonal Safety: Not on file   Stress: Not on file   Intimate Partner Violence: Not on file   Depression: Not at risk (11/15/2022)    PHQ-2     PHQ-2 Score: 0   Social Connections: Not on file     Would you be willing to receive help with any of the needs that you have identified today? Not applicable       Kermit Balo, [facility]  [facility] Specialty Pharmacist

## 2022-12-05 ENCOUNTER — Institutional Professional Consult (permissible substitution): Admit: 2022-12-05 | Discharge: 2022-12-06 | Payer: PRIVATE HEALTH INSURANCE

## 2022-12-05 DIAGNOSIS — C9100 Acute lymphoblastic leukemia not having achieved remission: Principal | ICD-10-CM

## 2022-12-05 LAB — COMPREHENSIVE METABOLIC PANEL
ALBUMIN: 3.8 g/dL (ref 3.4–5.0)
ALKALINE PHOSPHATASE: 50 U/L (ref 46–116)
ALT (SGPT): 22 U/L (ref 10–49)
ANION GAP: 5 mmol/L (ref 5–14)
AST (SGOT): 24 U/L (ref <=34)
BILIRUBIN TOTAL: 0.4 mg/dL (ref 0.3–1.2)
BLOOD UREA NITROGEN: 16 mg/dL (ref 9–23)
BUN / CREAT RATIO: 24
CALCIUM: 9.4 mg/dL (ref 8.7–10.4)
CHLORIDE: 108 mmol/L — ABNORMAL HIGH (ref 98–107)
CO2: 28.6 mmol/L (ref 20.0–31.0)
CREATININE: 0.66 mg/dL
EGFR CKD-EPI (2021) FEMALE: >90 mL/min/{1.73_m2} (ref >=60)
GLUCOSE RANDOM: 132 mg/dL (ref 70–179)
POTASSIUM: 3.9 mmol/L (ref 3.4–4.8)
PROTEIN TOTAL: 6.7 g/dL (ref 5.7–8.2)
SODIUM: 142 mmol/L (ref 135–145)

## 2022-12-05 LAB — CBC W/ AUTO DIFF
BASOPHILS ABSOLUTE COUNT: 0 10*9/L (ref 0.0–0.1)
BASOPHILS RELATIVE PERCENT: 0.7 %
EOSINOPHILS ABSOLUTE COUNT: 0 10*9/L (ref 0.0–0.5)
EOSINOPHILS RELATIVE PERCENT: 2 %
HEMATOCRIT: 34.4 % (ref 34.0–44.0)
HEMOGLOBIN: 12.3 g/dL (ref 11.3–14.9)
LYMPHOCYTES ABSOLUTE COUNT: 0.8 10*9/L — ABNORMAL LOW (ref 1.1–3.6)
LYMPHOCYTES RELATIVE PERCENT: 35.6 %
MEAN CORPUSCULAR HEMOGLOBIN CONC: 35.7 g/dL (ref 32.0–36.0)
MEAN CORPUSCULAR HEMOGLOBIN: 37.7 pg — ABNORMAL HIGH (ref 25.9–32.4)
MEAN CORPUSCULAR VOLUME: 105.6 fL — ABNORMAL HIGH (ref 77.6–95.7)
MEAN PLATELET VOLUME: 8.5 fL (ref 6.8–10.7)
MONOCYTES ABSOLUTE COUNT: 0.3 10*9/L (ref 0.3–0.8)
MONOCYTES RELATIVE PERCENT: 11.9 %
NEUTROPHILS ABSOLUTE COUNT: 1.1 10*9/L — ABNORMAL LOW (ref 1.8–7.8)
NEUTROPHILS RELATIVE PERCENT: 49.8 %
NUCLEATED RED BLOOD CELLS: 0 /100{WBCs} (ref <=4)
PLATELET COUNT: 156 10*9/L (ref 150–450)
RED BLOOD CELL COUNT: 3.26 10*12/L — ABNORMAL LOW (ref 3.95–5.13)
RED CELL DISTRIBUTION WIDTH: 13.9 % (ref 12.2–15.2)
WBC ADJUSTED: 2.2 10*9/L — ABNORMAL LOW (ref 3.6–11.2)

## 2022-12-05 LAB — CMV DNA, QUANTITATIVE, PCR: CMV VIRAL LD: NOT DETECTED

## 2022-12-05 MED ADMIN — heparin, porcine (PF) 100 unit/mL injection 500 Units: 500 [IU] | INTRAVENOUS | @ 15:00:00 | Stop: 2022-12-05

## 2022-12-05 NOTE — Unmapped (Signed)
Results for orders placed or performed in visit on 12/05/22   CBC w/ Differential   Result Value Ref Range    WBC 2.2 (L) 3.6 - 11.2 10*9/L    RBC 3.26 (L) 3.95 - 5.13 10*12/L    HGB 12.3 11.3 - 14.9 g/dL    HCT 16.1 09.6 - 04.5 %    MCV 105.6 (H) 77.6 - 95.7 fL    MCH 37.7 (H) 25.9 - 32.4 pg    MCHC 35.7 32.0 - 36.0 g/dL    RDW 40.9 81.1 - 91.4 %    MPV 8.5 6.8 - 10.7 fL    Platelet 156 150 - 450 10*9/L    nRBC 0 <=4 /100 WBCs    Neutrophils % 49.8 %    Lymphocytes % 35.6 %    Monocytes % 11.9 %    Eosinophils % 2.0 %    Basophils % 0.7 %    Absolute Neutrophils 1.1 (L) 1.8 - 7.8 10*9/L    Absolute Lymphocytes 0.8 (L) 1.1 - 3.6 10*9/L    Absolute Monocytes 0.3 0.3 - 0.8 10*9/L    Absolute Eosinophils 0.0 0.0 - 0.5 10*9/L    Absolute Basophils 0.0 0.0 - 0.1 10*9/L

## 2022-12-13 ENCOUNTER — Ambulatory Visit: Admit: 2022-12-13 | Discharge: 2022-12-13 | Payer: PRIVATE HEALTH INSURANCE

## 2022-12-13 ENCOUNTER — Ambulatory Visit
Admit: 2022-12-13 | Discharge: 2022-12-13 | Payer: PRIVATE HEALTH INSURANCE | Attending: Student in an Organized Health Care Education/Training Program | Primary: Student in an Organized Health Care Education/Training Program

## 2022-12-13 ENCOUNTER — Ambulatory Visit
Admit: 2022-12-13 | Discharge: 2022-12-13 | Payer: PRIVATE HEALTH INSURANCE | Attending: Adult Health | Primary: Adult Health

## 2022-12-13 ENCOUNTER — Other Ambulatory Visit: Admit: 2022-12-13 | Discharge: 2022-12-13 | Payer: PRIVATE HEALTH INSURANCE

## 2022-12-13 DIAGNOSIS — F419 Anxiety disorder, unspecified: Principal | ICD-10-CM

## 2022-12-13 DIAGNOSIS — H538 Other visual disturbances: Principal | ICD-10-CM

## 2022-12-13 DIAGNOSIS — Z79899 Other long term (current) drug therapy: Principal | ICD-10-CM

## 2022-12-13 DIAGNOSIS — Z515 Encounter for palliative care: Principal | ICD-10-CM

## 2022-12-13 DIAGNOSIS — C9100 Acute lymphoblastic leukemia not having achieved remission: Principal | ICD-10-CM

## 2022-12-13 DIAGNOSIS — J019 Acute sinusitis, unspecified: Principal | ICD-10-CM

## 2022-12-13 DIAGNOSIS — G893 Neoplasm related pain (acute) (chronic): Principal | ICD-10-CM

## 2022-12-13 DIAGNOSIS — T451X5A Adverse effect of antineoplastic and immunosuppressive drugs, initial encounter: Principal | ICD-10-CM

## 2022-12-13 DIAGNOSIS — B259 Cytomegaloviral disease, unspecified: Principal | ICD-10-CM

## 2022-12-13 DIAGNOSIS — A689 Relapsing fever, unspecified: Principal | ICD-10-CM

## 2022-12-13 DIAGNOSIS — D84821 Immunosuppressed due to chemotherapy (CMS-HCC): Principal | ICD-10-CM

## 2022-12-13 LAB — CBC W/ AUTO DIFF
BASOPHILS ABSOLUTE COUNT: 0 10*9/L (ref 0.0–0.1)
BASOPHILS RELATIVE PERCENT: 0.6 %
EOSINOPHILS ABSOLUTE COUNT: 0.1 10*9/L (ref 0.0–0.5)
EOSINOPHILS RELATIVE PERCENT: 2.3 %
HEMATOCRIT: 33.2 % — ABNORMAL LOW (ref 34.0–44.0)
HEMOGLOBIN: 12.1 g/dL (ref 11.3–14.9)
LYMPHOCYTES ABSOLUTE COUNT: 0.9 10*9/L — ABNORMAL LOW (ref 1.1–3.6)
LYMPHOCYTES RELATIVE PERCENT: 34.1 %
MEAN CORPUSCULAR HEMOGLOBIN CONC: 36.5 g/dL — ABNORMAL HIGH (ref 32.0–36.0)
MEAN CORPUSCULAR HEMOGLOBIN: 37 pg — ABNORMAL HIGH (ref 25.9–32.4)
MEAN CORPUSCULAR VOLUME: 101.4 fL — ABNORMAL HIGH (ref 77.6–95.7)
MEAN PLATELET VOLUME: 9 fL (ref 6.8–10.7)
MONOCYTES ABSOLUTE COUNT: 0.4 10*9/L (ref 0.3–0.8)
MONOCYTES RELATIVE PERCENT: 14.9 %
NEUTROPHILS ABSOLUTE COUNT: 1.2 10*9/L — ABNORMAL LOW (ref 1.8–7.8)
NEUTROPHILS RELATIVE PERCENT: 48.1 %
PLATELET COUNT: 155 10*9/L (ref 150–450)
RED BLOOD CELL COUNT: 3.27 10*12/L — ABNORMAL LOW (ref 3.95–5.13)
RED CELL DISTRIBUTION WIDTH: 13.4 % (ref 12.2–15.2)
WBC ADJUSTED: 2.5 10*9/L — ABNORMAL LOW (ref 3.6–11.2)

## 2022-12-13 LAB — COMPREHENSIVE METABOLIC PANEL
ALBUMIN: 4.1 g/dL (ref 3.4–5.0)
ALKALINE PHOSPHATASE: 55 U/L (ref 46–116)
ALT (SGPT): 20 U/L (ref 10–49)
ANION GAP: 7 mmol/L (ref 5–14)
AST (SGOT): 25 U/L (ref <=34)
BILIRUBIN TOTAL: 0.4 mg/dL (ref 0.3–1.2)
BLOOD UREA NITROGEN: 18 mg/dL (ref 9–23)
BUN / CREAT RATIO: 28
CALCIUM: 9.6 mg/dL (ref 8.7–10.4)
CHLORIDE: 111 mmol/L — ABNORMAL HIGH (ref 98–107)
CO2: 27 mmol/L (ref 20.0–31.0)
CREATININE: 0.64 mg/dL
EGFR CKD-EPI (2021) FEMALE: >90 mL/min/{1.73_m2} (ref >=60)
GLUCOSE RANDOM: 174 mg/dL (ref 70–179)
POTASSIUM: 3.7 mmol/L (ref 3.4–4.8)
PROTEIN TOTAL: 7.2 g/dL (ref 5.7–8.2)
SODIUM: 145 mmol/L (ref 135–145)

## 2022-12-13 LAB — AMYLASE: AMYLASE: 217 U/L — ABNORMAL HIGH (ref 30–118)

## 2022-12-13 LAB — CMV DNA, QUANTITATIVE, PCR: CMV VIRAL LD: NOT DETECTED

## 2022-12-13 LAB — LIPASE: LIPASE: 181 U/L — ABNORMAL HIGH (ref 12–53)

## 2022-12-13 MED ADMIN — sodium chloride (NS) 0.9 % infusion: 100 mL/h | INTRAVENOUS | @ 17:00:00

## 2022-12-13 MED ADMIN — vinCRIStine (ONCOVIN) 2 mg in sodium chloride (NS) 0.9 % 25 mL IVPB: 2 mg | INTRAVENOUS | @ 17:00:00 | Stop: 2022-12-13

## 2022-12-13 MED FILL — PREDNISONE 50 MG TABLET: 5 days supply | Qty: 20 | Fill #1

## 2022-12-13 NOTE — Unmapped (Addendum)
[facility] Follow Up Visit Note     [name]  Patient Age: 47 y.o.  Encounter Date: 12/13/2022    Primary Care Provider:  [name], MD    Referring Physician:  Marisa Sprinkles Per Patient  [address] [place],  [place] [address]    Cancer Diagnosis: Ph+ B-ALL; Initial Dx 10/29/2021  Cancer status: CR1, BCR/ABL p190 negative in marrow  Treatment Regimen: First Line, ponatinib + Maintenance (vincristine, prednisone) s/p truncated course of GRAAPH-2005, HyperCVAD  Treatment Goal: Curative  Comorbidities: HTN, anxiety, hemorrhoids with anal fissures  Transplant: referral deferred in CR1    Assessment/Plan:  [name] is a 47 y.o. female with past medical history of chronic right shoulder and back pain, hypertension, anxiety, and recently diagnosed Ph+ ALL. 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  Given her deep molecular remission and her intolerance of hyperCVAD, we are transitioned to maintenance chemotherapy.     She is now s/p 7 Cycle Maintenance ponatinib + vincristine + prednisone. She started ponatinib mid-cycle and ANC remained stable. She tolerated without symptoms. Lipase/amylase are rising but she is asymptomatic. Will trend and follow package insert. Will treat likely sinus infection with Augmentin.    Continue monthly IVIG.    Ph+ALL, in remission: On GRAAPH 2005 + dasatinib. Completed 12/12 planned IT treatments for CNS ppx.  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 09/2022 restarted Valcyte with the goal of controlling CMV and limiting neutropenia  - continue letermovir  - monthly CMV levels    Hypogammaglobulinemia:   - Monthly IVIG started 07/26/22 - now at [facility] due to insurance coverage    Hemorrhoids, Anal fissure: persistent but stable, per patient 12/13/2022  - supportive care at home    Pancytopenia due to chemotherapy  - ANC 1.2, Platelets 155K 12/13/2022  - supportive care as detailed below when in nadir    Menses suppression and birth control: previously received Lupron with last dose 07/26/22   - Micronor - daily   Will need birth control while on chemo, including when on only TKI -     Psychosocial distress: She reports a moderate level of anxiety regarding the management of the above.   - Counseling given   - Consider ref to comprehensive cancer support program     Patient-centered care/Shared decision-making:   We discussed the plan above at length. The patient and her husband actively contributed to the conversation. Specifically, her most important outcomes are:   Prolonging overall survival and Maintaining her overall functional activity     Supportive Care Needs: We recommend based on the patient???s underlying diagnosis and treatment history the following supportive care:    1. Antimicrobial prophylaxis:    Viral - valacyclovir and letermovir  Bacterial: Levofloxacin when ANC <0.5  Fungal: Fluconazole 200mg  when ANC <0.5  PJP: dapsone 100mg  daily    2. Blood product support:  Leukoreduced blood products are required.  Irradiated blood products are preferred, but in case of urgent  transfusion needs non-irradiated blood products may be used: 2 units for Hg <=8.0.     Coordination of care:   - Continue maintenance q4 weeks   - lab check in 2 weeks      [name], AGPCNP-BC  Nurse Practitioner  Hematology/Oncology  [facility]    [name], MD was available  Leukemia Program  Division of Hematology  [facility]      Nurse Navigator (non-clinical trial patients): Elicia Lamp, RN        Tel. [phone]       Fax. [phone]  Toll-free appointments: [phone]  Scheduling assistance: [phone]  After hours/weekends: [phone] (ask for adult hematology/oncology on-call)      History of Present Illness:   Patricia [name] is a 47 y.o. female with past medical history noted as above who presents for follow up Ph+ ALL.      Her social history is notable for being a Runner, broadcasting/film/video.  She was recently married. She lives with her husband.    Interim history  Since her last visit:  She started the ponatinib 2 weeks ago. She has not noticed any side effect. It does not bother her belly.  She thinks she has developed a sinus infection. It started as an occipital headache but then progressed to frontal sinus pain/pressure. It feels similar to prior sinus infections (although she reports the symptoms can vary). Elevated temp but never >100. Sinus drainage but not foul tasting. Reports Augmentin is the only thing that works for her.    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  Neuropathy - just a  little tingling in pinky toes bilaterally.  Fingertips are not tingling currently.  No fevers/chills.  No nausea/vomiting.  Since last LP she has soreness in her low back - particularly when stretching forward.  Since last cycle -  feels tightness in lower legs - gets better after she starts walking. Knees are intermittently painful.     Traveling to [facility] - June 21      Oncology History is as below:   Hematology/Oncology History Overview Note   Referring/Local Oncologist:    Diagnosis:   10/29/2021  Bone marrow, left iliac, aspiration and biopsy  -  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 The majority of the abnormal cells (64/88) contained an additional BCR/ABL1 fusion signal, while 9/88 abnormal cells contained an additional ABL1 and ASS1 signal.       Genetics:   Karyotype/FISH:   RESULTS   Date Value Ref Range Status   10/28/2021   Final    NOTE: This report reflects a combined study from a peripheral blood and a bone marrow core biopsy. Eleven cells from the peripheral blood and nine cells from the bone marrow core biopsy were analyzed. The BCR/ABL1 FISH analysis was performed on the peripheral blood.     Abnormal Karyotype:  47,XX,t(9;22)(q34;q11.2),+der(22)t(9;22)[18]/46,XX[2]    Abnormal FISH:  A BCR/ABL1 interphase FISH assay showed a signal pattern consistent with a BCR::ABL1 rearrangement and the 9;22 translocation in 88% of the 100 cells scored. The majority of the abnormal cells (64/88) contained an additional BCR/ABL1 fusion signal, while 9/88 abnormal cells contained an additional ABL1 and  ASS1 signal.           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  MRD by flow insufficient, p190 2/100,000  12/10/2021: Cycle 2 B cycle + rituximab  12/14/21: IT#4  12/17/21: IT#5  01/10/22: Cycle 3 A cycle + rituximab  01/11/22: IT #6  01/24/22: Bmbx - MRD-neg by PCR (p190)   02/17/22: C4 B cycle + ritux  02/18/22: IT#7  03/21/22: C5 A cycle + ritux (4th dose)  03/22/22: IT#8  04/29/22: Bmbx - MRD-neg by PCR (p190)   05/18/22: restart dasatinib 70mg  s/p neutropenia  05/26/22: Start maintenance vincristine 2g, Prednisone 200mg  D1-5   06/02/22: IT #9  06/27/22: C2 Maintenance: vincristine 2g, Prednisone 200mg  D1-5, HOLD dasatinib due to neutropenia  07/04/22: RESTART dasatinib 70mg   07/21/22: IT #10  07/26/22: C3 Maintenance: vincristine 2g, Prednisone 200mg  D1-5, dasatinib 70 mg  08/08/22: IT #11 - prophylaxis completed  08/23/22: C4 Maintenance: vincristine 2g, Prednisone 200mg  D1-5, dasatinib 70 mg  09/20/22: C5 Maintenance: vincristine 2g, Prednisone 200mg  D1-5, dasatinib 70 mg  10/04/22: Bone marrow biopsy - continued remission, MRD and BCR/ABL p190 negative  10/18/22: C6 Maintenance. ANC 0.4, HOLD dasatinib, continue vincristine 2g and Prednisone 200mg  D1-5  11/15/22: C7 Maintenance ANC 0.4. HOLD TKI, continue vincristine 2g and Prednisone 200mg  D1-5  ~11/30/22: Start ponatinib 15 mg daily  - 12/13/22: C8 Maintenance ANC 1.2. ponatinib 15mg , vincristine 2g D1 and Prednisone 200mg  D1-5         Review of Systems:   ROS reviewed and negative except as noted in H and P     Allergies:  Allergies   Allergen Reactions    Erythromycin Hives     Other reaction(s): Not available    Other      Pt cant take ibuprofen due to condition.    Sulfa (Sulfonamide Antibiotics) Anaphylaxis     Other reaction(s): Not available    Azithromycin        Medications:     Current Outpatient Medications:     acetaminophen (TYLENOL 8 HOUR) 650 MG CR tablet, Take 2 tablets (1,300 mg total) by mouth every eight (8) hours as needed for pain., Disp: , Rfl:     albuterol HFA 90 mcg/actuation inhaler, Inhale 2 puffs every six (6) hours as needed for wheezing., Disp: , Rfl:     aspirin (ECOTRIN) 81 MG tablet, Take 1 tablet (81 mg total) by mouth daily., Disp: , Rfl:     cetirizine (ZYRTEC) 10 MG tablet, Take 1 tablet (10 mg total) by mouth nightly., Disp: 30 tablet, Rfl: 2    dapsone 100 MG tablet, TAKE 1 TABLET(100 MG) BY MOUTH DAILY, Disp: 30 tablet, Rfl: 11    escitalopram oxalate (LEXAPRO) 10 MG tablet, Take 1 tablet (10 mg total) by mouth daily., Disp: 90 tablet, Rfl: 0    letermovir (PREVYMIS) 480 mg tablet, Take 1 tablet (480 mg total) by mouth daily., Disp: 28 tablet, Rfl: 2    levalbuterol (XOPENEX) 0.31 mg/3 mL nebulizer solution, Inhale 3 mL (0.31 mg total) by nebulization every four (4) hours as needed for wheezing (cough)., Disp: 45 mL, Rfl: 0    norethindrone (MICRONOR) 0.35 mg  tablet, Take 1 tablet by mouth daily., Disp: 84 tablet, Rfl: 3    PONATinib (ICLUSIG) 15 mg tablet, Take 1 tablet (15 mg total) by mouth daily. Swallow tablets whole. Do not crush, break, cut or chew tablets., Disp: 30 tablet, Rfl: 5    predniSONE (DELTASONE) 50 MG tablet, Take 4 tablets (200 mg total) by mouth once daily on days 1-5 of each cycle., Disp: 20 tablet, Rfl: 5    tizanidine (ZANAFLEX) 4 MG tablet, Take 1 tablet (4 mg total) by mouth nightly., Disp: 90 tablet, Rfl: 2    triamcinolone (KENALOG) 0.1 % cream, Apply topically two (2) times a day for 14 days., Disp: 45 g, Rfl: 0    valACYclovir (VALTREX) 500 MG tablet, Take 1 tablet (500 mg total) by mouth daily., Disp: 30 tablet, Rfl: 12    amoxicillin-clavulanate (AUGMENTIN) 875-125 mg per tablet, Take 1 tablet by mouth every twelve (12) hours., Disp: , Rfl:   No current facility-administered medications for this visit.    Facility-Administered Medications Ordered in Other Visits:     dexAMETHasone (DECADRON) 4 mg/mL injection 20 mg, 20 mg, Intravenous, Once PRN, Lenon Ahmadi, AGNP    diphenhydrAMINE (BENADRYL) injection 25 mg, 25 mg, Intravenous, Once PRN, Lenon Ahmadi, AGNP    EPINEPHrine [facility]) injection 0.3 mg, 0.3 mg, Intramuscular, Once PRN, Lenon Ahmadi, AGNP    famotidine (PF) (PEPCID) injection 20 mg, 20 mg, Intravenous, Once PRN, Lenon Ahmadi, AGNP    methylPREDNISolone sodium succinate (PF) (SOLU-Medrol) injection 125 mg, 125 mg, Intravenous, Once PRN, Dellis Anes, Ursula Alert, AGNP    OKAY TO SEND MEDICATION/CHEMOTHERAPY TO OUTPATIENT UNIT, , Other, Once, Lenon Ahmadi, AGNP    prochlorperazine (COMPAZINE) injection 10 mg, 10 mg, Intravenous, Q6H PRN, Lenon Ahmadi, AGNP    prochlorperazine (COMPAZINE) tablet 10 mg, 10 mg, Oral, Q6H PRN, Lenon Ahmadi, AGNP    sodium chloride (NS) 0.9 % infusion, 100 mL/hr, Intravenous, Continuous, Lenon Ahmadi, AGNP, Stopped at 12/13/22 1242    sodium chloride (NS) 0.9 % infusion, 20 mL/hr, Intravenous, Continuous PRN, Lenon Ahmadi, AGNP    sodium chloride 0.9% (NS) bolus 1,000 mL, 1,000 mL, Intravenous, Once PRN, Lenon Ahmadi, AGNP    Medical History:  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    EBV infection        Social History:  Social History     Social History Narrative    Not on file       Family History:  No family history on file.    Objective:   BP 131/94  - Pulse 85  - Temp 37.3 ??C (99.1 ??F) (Temporal)  - Resp 18  - Wt 85 kg (187 lb 4.8 oz)  - SpO2 96%  - BMI 29.34 kg/m??     Physical Exam:  GENERAL: well-appearing  woman   HEART: Normal color, not excessive pallor.  CHEST/LUNG: Normal work of breathing. No dyspnea with conversation. No crackles. Regular rate/rhythm  ABDOMEN: No obvious distention.    EXTREMITIES: No edema, cyanosis or clubbing.   SKIN: no rashes noted  NEURO EXAM: Grossly intact.       Test Results:  12/13/2022 CBC/d and CMP reviewed with patient

## 2022-12-13 NOTE — Unmapped (Signed)
Patient tolerated infusion well with NAD noted, discharged ambulatory to home/self care.

## 2022-12-13 NOTE — Unmapped (Signed)
You are doing well overall!  Proceed with vincristine and the 5 days of steroids.    ANC has remained stable on ponatinib and you have tolerated well thus far.    Take a week of Augmentin for the likely sinus infection. Try using the neti pot for symptom relief.    Please fast prior to your lab check in 2 weeks (checking cholesterol again).    We are also monitoring the amylase/lipase levels. They have increased since starting ponatinib. Hopefully they will stabilize.    I will see you back in 4 weeks, prior to the next cycle.

## 2022-12-13 NOTE — Unmapped (Signed)
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 Now being treated, awaiting results of latest CMV test.  - Cont tylenol 650mg  TID PRN    #Acute Sinusitis: New, worsening. Likely bacterial based on time trajectory. Has Augmentin script already but going to confirm okay to use iwht oncology team later this morning  - Augmenting BID if approved by oncology    #Blurred Vision: Stable, but not resolved. Planning to get to eye doctor soon.     #Cancer-Related Pain: Slightly worse, but still mild. Locates to area of recent BMBx. Also with muscle aches intermittnetly. Responsive to tylenol and tizanidine.  - Cont tylenol PRN  - Cont tizandine PRN    #Anxiety/Coping Support: Improved. More at peace after deciding not to go back to school next year.  She put herself back on prior prescription of lexapro (started 9/8). Doing well on her current dose and reports no recent panic attacks. Still has a racing mind at night and occasional heart flutters.  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     #Advanced Care Planning  - She and her husband have an excellent understanding of the current course of her treatment.  She recognizes that she is responding well and there is some uncertainty about how many rounds of treatment she will have to go through.  Has heard that the goal of treatment is curative, and she remains very hopeful for this.  Has been able to tolerate treatment fairly well to this point. Her major concern is about side effects from lumbar punctures.  Current focus is on cancer treatment and survival while maintaining quality of life is much as possible.    Health Care Decision Maker as of 12/13/2022    HCDM (patient stated preference): Patricia [name] Spouse - [phone]      # Controlled substances risk management.   - Patient does not have a signed pain medication agreement with our team.  No controlled substances being prescribed by our team currently.    - [place] database was reviewed today and it was appropriate.   - Urine drug screen was not performed at this visit. Findings: not applicable.   - Patient has received information about safe storage and administration of medications.   - Patient has not received a prescription for narcan; is not applicable.       F/u: 8-10 weeks    ----------------------------------------  Referring Provider: From inpatient palliative care team  Oncology Team: Malignant hematology  PCP: [name], MD    Interval Hx, 12/13/22:    Since last visit, was able to start ponatinib after having improvement in her ANC. BMBx recently showed deep remission.     Today, she reports that she is doing well overall. Has been dealing with sinus pressure for last two week that started worsening over the last 4 days. Suspects sinus infection as she has had many in the past. Awaiting approval from oncology for augementin course.     On ROS, some tenderness near lumbar puncture site when  dorsiflexing. Also with muscle tightness/soreness, especially after prologned sitting. Tizanidine has helped.     Doing well from mood perspecitive. Gardening more. 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 Non-profit that will focus on supporting [facility]. Wants to make sure families have additional support when caring for patients with prolonged hospital stays.    Still with eye blurriness. No other acute concerns. No n/v, constipation or diarrhea.       HPI: Ph+ ALL, currently in remission.    Current cancer-directed therapy: maintenance vincristine 2g, Prednisone 200mg  D1-4 +dasatinib       Palliative Performance Scale: 90% - Ambulation: Full / Normal Activity, some evidence of disease / Self-Care:Full / Intake: Normal / Level of Conscious: Full      Coping/Support Issues: She and her husband have multiple stressors but overall feels like she is coping fairly well right now.  She will reach out to our palliative care team if new concerns emerge.    Goals of Care:  Focus on cancer directed therapy and survival while hoping to maintain her quality of life is much as possible    Social History:   Name of primary support: Husband Patricia [name]  occupation: 6 grade teacher      Advance Care Planning:   HCPOA: Spouse  Natural surrogate decision maker: Husband Patricia [name]  Living Will: no  ACP note:     Objective       Hematology/Oncology History Overview Note   Referring/Local Oncologist:    Diagnosis:   10/29/2021  Bone marrow, left iliac, aspiration and biopsy  -  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 The majority of the abnormal cells (64/88) contained an additional BCR/ABL1 fusion signal, while 9/88 abnormal cells contained an additional ABL1 and ASS1 signal.       Genetics:   Karyotype/FISH:   RESULTS   Date Value Ref Range Status   10/28/2021   Final    NOTE: This report reflects a combined study from a peripheral blood and a bone marrow core biopsy. Eleven cells from the peripheral blood and nine cells from the bone marrow core biopsy were analyzed. The BCR/ABL1 FISH analysis was performed on the peripheral blood.     Abnormal Karyotype:  47,XX,t(9;22)(q34;q11.2),+der(22)t(9;22)[18]/46,XX[2]    Abnormal FISH:  A BCR/ABL1 interphase FISH assay showed a signal pattern consistent with a BCR::ABL1 rearrangement and the 9;22 translocation in 88% of the 100 cells scored. The majority of the abnormal cells (64/88) contained an additional BCR/ABL1 fusion signal, while 9/88 abnormal cells contained an additional ABL1 and ASS1 signal.           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  MRD by flow insufficient, p190 2/100,000  12/10/2021: Cycle 2 B  cycle + rituximab  12/14/21: IT#4  12/17/21: IT#5  01/10/22: Cycle 3 A cycle + rituximab  01/11/22: IT #6  01/24/22: Bmbx - MRD-neg by PCR (p190)   02/17/22: C4 B cycle + ritux  02/18/22: IT#7  03/21/22: C5 A cycle + ritux (4th dose)  03/22/22: IT#8  04/29/22: Bmbx - MRD-neg by PCR (p190)   05/18/22: restart dasatinib 70mg  s/p neutropenia  05/26/22: Start maintenance vincristine 2g, Prednisone 200mg  D1-5   06/02/22: IT #9  06/27/22: C2 Maintenance: vincristine 2g, Prednisone 200mg  D1-5, HOLD dasatinib due to neutropenia  07/04/22: RESTART dasatinib 70mg   07/21/22: IT #10  07/26/22: C3 Maintenance: vincristine 2g, Prednisone 200mg  D1-5, dasatinib 70 mg  08/08/22: IT #11 - prophylaxis completed  08/23/22: C4 Maintenance: vincristine 2g, Prednisone 200mg  D1-5, dasatinib 70 mg  09/20/22: C5 Maintenance: vincristine 2g, Prednisone 200mg  D1-5, dasatinib 70 mg  10/04/22: Bone marrow biopsy - continued remission, MRD and BCR/ABL p190 negative  10/18/22: C6 Maintenance. ANC 0.4, HOLD dasatinib, continue vincristine 2g and Prednisone 200mg  D1-5  11/15/22: C7 Maintenance ANC 0.4. 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)   10/29/2021 Initial Diagnosis    B-cell acute lymphoblastic leukemia (ALL) (CMS-HCC)     10/30/2021 - 03/30/2022 Chemotherapy    IP/OP LEUKEMIA GRAAPH-2005 < 60 YO (OP PEGFILGRASTIM ON DAY 7)  [No description for this plan]     01/06/2022 - 04/07/2022 Endocrine/Hormone Therapy    OP LEUPROLIDE (LUPRON) 11.25 MG EVERY 3 MONTHS  Plan Provider: [name], MD     05/26/2022 -  Chemotherapy    OP LEUKEMIA VINCRISTINE  vinCRIStine 2 mg IV on day 1     07/26/2022 - 07/26/2022 Endocrine/Hormone Therapy    OP LEUPROLIDE (LUPRON) 11.25 MG EVERY 3 MONTHS  Plan Provider: [name], MD           REVIEW OF SYSTEMS:  A comprehensive review of 10 systems was negative except for pertinent positives noted in HPI.      PHYSICAL EXAM:   Vitals:    12/13/22 0837   BP: 144/98   Pulse: 105   Temp: 37 ??C (98.6 ??F)   SpO2: 94%     GEN: Awake and alert, pleasant appearing female in no acute distress. Hair is growing back, much more than last visit  LUNGS: No increased work of breathing on room air.  PSYCH: Alert and oriented to person, place and time. Excited when talking about her projects.  SKIN: No rashes, petechiae or jaundice noted    I personally spent 35 minutes face-to-face and non-face-to-face in the care of this patient, which includes all pre, intra, and post visit time on the date of service.  All documented time was specific to the E/M visit and does not include any procedures that may have been performed.     Redgie Grayer, MD  [facility]

## 2022-12-13 NOTE — Unmapped (Signed)
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   Medication Access: $0 copay with insurance (ponatinib)    Interval History: I saw Patricia [name] today to discuss plan for C8 of maintenance chemo and after recent switch from dasatinib to ponatinib. She reports she feels well. She does report signs of sinus infection and is planning to take Augmentin course for this - did have one low grade fever (99.4) recently, but in the setting of the sinus infection. Otherwise, no fevers for past several weeks. She has some joint stiffness but otherwise no significant myalgias. No headaches outside of sinus infection. No nausea/vomiting, abdominal pain. She does have occasional constipation post VCR but managing. Appetite good. PN is stable. Planning to go to [facility] next week for next IVIG dose. Now taking micronor without issue. She is tracking her BP at home as this was a concern for her in the past on OCP and also with start of ponatinib. Taking ponatinib, baby aspirin, and all other medications as instructed.       Labs: WBC 2.5, ANC 1.2, Hgb 12.1, PLT 155, CMP stable/WNL; amylase 217, lipase 181 (grade 2 & asymptomatic elevation); lipid panel from 11/15/22 with TC 255, TG 413, LDL-c unable to calculate  CMV viral load undetectable   Total lgG 237 from 11/27 (now getting monthly IVIG); absolute CD4 count 276  BCR-ABL p190 (10/04/22): negative  BMBx (10/04/22): <5% blasts, MRD negative    Oncologic History:  Hematology/Oncology History Overview Note   Referring/Local Oncologist:    Diagnosis:   10/29/2021  Bone marrow, left iliac, aspiration and biopsy  -  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 The majority of the abnormal cells (64/88) contained an additional BCR/ABL1 fusion signal, while 9/88 abnormal cells contained an additional ABL1 and ASS1 signal.       Genetics:   Karyotype/FISH:   RESULTS   Date Value Ref Range Status   10/28/2021   Final    NOTE: This report reflects a combined study from a peripheral blood and a bone marrow core biopsy. Eleven cells from the peripheral blood and nine cells from the bone marrow core biopsy were analyzed. The BCR/ABL1 FISH analysis was performed on the peripheral blood.     Abnormal Karyotype:  47,XX,t(9;22)(q34;q11.2),+der(22)t(9;22)[18]/46,XX[2]    Abnormal FISH:  A BCR/ABL1 interphase FISH assay showed a signal pattern consistent with a BCR::ABL1 rearrangement and the 9;22 translocation in 88% of the 100 cells scored. The majority of the abnormal cells (64/88) contained an additional BCR/ABL1 fusion signal, while 9/88 abnormal cells contained an additional ABL1 and ASS1 signal.           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  MRD by flow insufficient, p190 2/100,000  12/10/2021: Cycle 2 B cycle + rituximab  12/14/21: IT#4  12/17/21: IT#5  01/10/22: Cycle 3 A cycle + rituximab  01/11/22: IT #6  01/24/22: Bmbx - MRD-neg by PCR (p190)   02/17/22: C4 B cycle + ritux  02/18/22: IT#7  03/21/22: C5 A cycle + ritux (4th dose)  03/22/22: IT#8  04/29/22: Bmbx - MRD-neg by PCR (p190)   05/18/22: restart dasatinib 70mg  s/p neutropenia  05/26/22: Start maintenance vincristine 2g, Prednisone 200mg  D1-5   06/02/22: IT #9  06/27/22: C2 Maintenance: vincristine 2g, Prednisone 200mg  D1-5, HOLD dasatinib due to neutropenia  07/04/22: RESTART dasatinib 70mg   07/21/22: IT #10  07/26/22: C3 Maintenance: vincristine 2g, Prednisone 200mg  D1-5, dasatinib 70 mg  08/08/22: IT #11 - prophylaxis completed  08/23/22: C4 Maintenance: vincristine 2g, Prednisone 200mg  D1-5, dasatinib 70 mg  09/20/22: C5 Maintenance: vincristine 2g, Prednisone 200mg  D1-5, dasatinib 70 mg  10/04/22: Bone marrow biopsy - continued remission, MRD and BCR/ABL p190 negative  10/18/22: C6 Maintenance. ANC 0.4, HOLD dasatinib, continue vincristine 2g and Prednisone 200mg  D1-5  11/15/22: C7 Maintenance ANC 0.4. 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)   10/29/2021 Initial Diagnosis    B-cell acute lymphoblastic leukemia (ALL) (CMS-HCC)     10/30/2021 - 03/30/2022 Chemotherapy    IP/OP LEUKEMIA GRAAPH-2005 < 60 YO (OP PEGFILGRASTIM ON DAY 7)  [No description for this plan]     01/06/2022 - 04/07/2022 Endocrine/Hormone Therapy    OP LEUPROLIDE (LUPRON) 11.25 MG EVERY 3 MONTHS  Plan Provider: [name], MD     05/26/2022 -  Chemotherapy    OP LEUKEMIA VINCRISTINE  vinCRIStine 2 mg IV on day 1     07/26/2022 - 07/26/2022 Endocrine/Hormone Therapy    OP LEUPROLIDE (LUPRON) 11.25 MG EVERY 3 MONTHS  Plan Provider: [name], MD         Weight and Vitals:  Wt Readings from Last 3 Encounters:   12/13/22 85 kg (187 lb 4.8 oz)   12/13/22 85 kg (187 lb 4.8 oz)   11/23/22 84.6 kg (186 lb 9.6 oz)     Temp Readings from Last 3 Encounters:   12/13/22 37.3 ??C (99.1 ??F) (Temporal)   12/13/22 37 ??C (98.6 ??F) (Temporal)   11/23/22 37 ??C (98.6 ??F) (Temporal)     BP Readings from Last 3 Encounters:   12/13/22 141/100   12/13/22 144/98   11/23/22 131/95     Pulse Readings from Last 3 Encounters: 12/13/22 95   12/13/22 105   11/23/22 113       Pertinent Labs:  Lab on 12/13/2022   Component Date Value Ref Range Status    Sodium 12/13/2022 145  135 - 145 mmol/L Final    Potassium 12/13/2022 3.7  3.4 - 4.8 mmol/L Final    Chloride 12/13/2022 111 (H)  98 - 107 mmol/L Final    CO2 12/13/2022 27.0  20.0 - 31.0 mmol/L Final    Anion Gap 12/13/2022 7  5 - 14 mmol/L Final    BUN 12/13/2022 18  9 - 23 mg/dL Final    Creatinine 16/05/9603 0.64  0.55 - 1.02 mg/dL Final    BUN/Creatinine Ratio 12/13/2022 28   Final    eGFR CKD-EPI (2021) Female 12/13/2022 >90  >=60 mL/min/1.41m2 Final    eGFR calculated with CKD-EPI 2021  equation in accordance with [employer] and [employer] of Nephrology Task Force recommendations.    Glucose 12/13/2022 174  70 - 179 mg/dL Final    Calcium 16/05/9603 9.6  8.7 - 10.4 mg/dL Final    Albumin 54/04/8118 4.1  3.4 - 5.0 g/dL Final    Total Protein 12/13/2022 7.2  5.7 - 8.2 g/dL Final    Total Bilirubin 12/13/2022 0.4  0.3 - 1.2 mg/dL Final    AST 14/78/2956 25  <=34 U/L Final    ALT 12/13/2022 20  10 - 49 U/L Final    Alkaline Phosphatase 12/13/2022 55  46 - 116 U/L Final    WBC 12/13/2022 2.5 (L)  3.6 - 11.2 10*9/L Final    RBC 12/13/2022 3.27 (L)  3.95 - 5.13 10*12/L Final    HGB 12/13/2022 12.1  11.3 - 14.9 g/dL Final    HCT 21/30/8657 33.2 (L)  34.0 - 44.0 % Final    MCV 12/13/2022 101.4 (H)  77.6 - 95.7 fL Final    MCH 12/13/2022 37.0 (H)  25.9 - 32.4 pg Final    MCHC 12/13/2022 36.5 (H)  32.0 - 36.0 g/dL Final    RDW 84/69/6295 13.4  12.2 - 15.2 % Final    MPV 12/13/2022 9.0  6.8 - 10.7 fL Final    Platelet 12/13/2022 155  150 - 450 10*9/L Final    Neutrophils % 12/13/2022 48.1  % Final    Lymphocytes % 12/13/2022 34.1  % Final    Monocytes % 12/13/2022 14.9  % Final    Eosinophils % 12/13/2022 2.3  % Final    Basophils % 12/13/2022 0.6  % Final    Absolute Neutrophils 12/13/2022 1.2 (L)  1.8 - 7.8 10*9/L Final    Absolute Lymphocytes 12/13/2022 0.9 (L)  1.1 - 3.6 10*9/L Final    Absolute Monocytes 12/13/2022 0.4  0.3 - 0.8 10*9/L Final    Absolute Eosinophils 12/13/2022 0.1  0.0 - 0.5 10*9/L Final    Absolute Basophils 12/13/2022 0.0  0.0 - 0.1 10*9/L Final    Amylase 12/13/2022 217 (H)  30 - 118 U/L Final    Lipase 12/13/2022 181 (H)  12 - 53 U/L Final       Allergies:   Allergies   Allergen Reactions    Erythromycin Hives     Other reaction(s): Not available    Other      Pt cant take ibuprofen due to condition.    Sulfa (Sulfonamide Antibiotics) Anaphylaxis     Other reaction(s): Not available    Azithromycin        Drug Interactions: none identified; ponatinib is a major 3A4 substrate, caution with 3A4 inhibitors; letermovir is a mod 3A4 inhibitor (no adjustments necessary for ponatinib/VCR, monitor); fluconazole max dose with VCR is 200 mg daily;      Current Medications:  Current Outpatient Medications   Medication Sig Dispense Refill    acetaminophen (TYLENOL 8 HOUR) 650 MG CR tablet Take 2 tablets (1,300 mg total) by mouth every eight (8) hours as needed for pain.      albuterol HFA 90 mcg/actuation inhaler Inhale 2 puffs every six (6) hours as needed for wheezing.      aspirin (ECOTRIN) 81 MG tablet Take 1 tablet (81 mg total) by mouth daily.      cetirizine (ZYRTEC) 10 MG tablet Take 1 tablet (10 mg total) by mouth nightly. 30 tablet 2    dapsone 100 MG tablet TAKE 1  TABLET(100 MG) BY MOUTH DAILY 30 tablet 11    escitalopram oxalate (LEXAPRO) 10 MG tablet Take 1 tablet (10 mg total) by mouth daily. 90 tablet 0    letermovir (PREVYMIS) 480 mg tablet Take 1 tablet (480 mg total) by mouth daily. 28 tablet 2    levalbuterol (XOPENEX) 0.31 mg/3 mL nebulizer solution Inhale 3 mL (0.31 mg total) by nebulization every four (4) hours as needed for wheezing (cough). 45 mL 0    norethindrone (MICRONOR) 0.35 mg tablet Take 1 tablet by mouth daily. 84 tablet 3    PONATinib (ICLUSIG) 15 mg tablet Take 1 tablet (15 mg total) by mouth daily. Swallow tablets whole. Do not crush, break, cut or chew tablets. 30 tablet 5    predniSONE (DELTASONE) 50 MG tablet Take 4 tablets (200 mg total) by mouth once daily on days 1-5 of each cycle. 20 tablet 5    tizanidine (ZANAFLEX) 4 MG tablet Take 1 tablet (4 mg total) by mouth nightly. 90 tablet 2    triamcinolone (KENALOG) 0.1 % cream Apply topically two (2) times a day for 14 days. 45 g 0    valACYclovir (VALTREX) 500 MG tablet Take 1 tablet (500 mg total) by mouth daily. 30 tablet 12     No current facility-administered medications for this visit.       Adherence: No barriers identified/no missed doses.       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 Patient finished 5 cycles of hyperCVAD + rituximab + dasatinib before transitioning to VCR/dasatinib/prednisone maintenance . She was recently transitioned from dasatinib to ponatinib about two weeks ago though, given difficulty with cytopenias on dasatinib (had been on dasatinib 70 mg daily and unable to titrate to normal maintenance dosing due to neutropenia). Previous cytopenias may have been CMV and/or valcyte related as well, and she was transitioned to letermovir by ICID to avoid valcyte-induced myelosuppression. CMV has recently remained undetectable. She was transitioned to ponatinib 15 mg daily with the thought that neutropenia may have been secondary to dasatinib (started at 15 mg dosing since BCR-ABL negative). Most recent BMBx (10/04/22) showed MRD negative disease. She is tolerating ponatinib well thus far, counts remain stable. Amylase/lipase elevated though she is asymptomatic. Will CTM. Receiving IVIG to minimize infectious complications.    Plan:   1) Ph+ ALL  - Continue vincristine IV on day 1 of 28 day cycle (due today)  - Continue prednisone 200 mg (4 tablets) days 1-5 of 28 day cycle (will restart today)  - Continue ponatinib 15 mg daily  - Continue aspirin 81 mg with ponatinib  - Consider repeat fasting lipid panel while on ponatinib; trend amylase/lipase however elevations are asymptomatic at this time  - #12 of 12 IT therapy complete  - Labs in 2 weeks, RTC in 4 weeks ahead of C9  - BCR-ABL continues to be negative (most recent 10/04/22)    2) Infection   -Continue levofloxacin 500 mg daily and fluconazole 200 mg daily for ANC <0.5   -Continue dapsone 100 mg PO daily for PJP ppx  -Continue letermovir 480 mg daily per ICID (started 10/05/22)   -Continue valacyclovir 500 mg daily (will hold if needing to resume valganciclovir)   -Continue IVIG - dose due 5/22 and continue every 4 weeks (scheduled to be given at [facility])  -CMV will now only be checked monthly    3) Supportive Care  - Continue Tylenol prn  for pain. Check temperature before Tylenol dose and avoid taking NSAIDs.  - Can take Dulcolax prn for constipation  - Can take famotidine if having acid reflex from steroids.   - Most recent dose of Lupron on 07/26/22, which was final dose. Currently on daily Micronor. She can follow with gynecology for alternatives as needed. Described return of her period (or lack thereof) is not an indicator of fertility and should proceed cautiously (with condoms or birth control) regardless.    F/u:  Future Appointments   Date Time Provider Department Center   12/13/2022 10:00 AM ONCHEM LEUKEMIA PHARMACIST [id number]   12/13/2022 12:30 PM ONCINF CHAIR 15 [facility]   12/21/2022  9:45 AM UNCTIF 17 [facility]   01/10/2023  8:45 AM ONCINF CHAIR 05 [facility]   02/15/2023 10:00 AM Andermann, Alisia Ferrari, MD [id number]       I spent 30 minutes in direct patient care    Ronnald Collum, PharmD, BCOP, CPP  Hematology/Oncology Clinical Pharmacist  Pager [phone]

## 2022-12-16 DIAGNOSIS — C9101 Acute lymphoblastic leukemia, in remission: Principal | ICD-10-CM

## 2022-12-16 DIAGNOSIS — D801 Nonfamilial hypogammaglobulinemia: Principal | ICD-10-CM

## 2022-12-19 DIAGNOSIS — B259 Cytomegaloviral disease, unspecified: Principal | ICD-10-CM

## 2022-12-19 MED ORDER — LETERMOVIR 480 MG TABLET
ORAL_TABLET | Freq: Every day | ORAL | 2 refills | 28 days | Status: CP
Start: 2022-12-19 — End: ?
  Filled 2022-12-26: qty 28, 28d supply, fill #0

## 2022-12-19 NOTE — Unmapped (Signed)
Pt is requesting refill    Most recent clinic visit: 12/13/2022  Next clinic visit:  01/10/2023

## 2022-12-21 ENCOUNTER — Ambulatory Visit: Admit: 2022-12-21 | Discharge: 2022-12-22 | Payer: PRIVATE HEALTH INSURANCE

## 2022-12-21 MED ADMIN — heparin, porcine (PF) 100 unit/mL injection 500 Units: 500 [IU] | INTRAVENOUS | @ 16:00:00 | Stop: 2022-12-21

## 2022-12-21 MED ADMIN — diphenhydrAMINE (BENADRYL) capsule/tablet 25 mg: 25 mg | ORAL | @ 14:00:00 | Stop: 2022-12-21

## 2022-12-21 MED ADMIN — acetaminophen (TYLENOL) tablet 650 mg: 650 mg | ORAL | @ 14:00:00 | Stop: 2022-12-21

## 2022-12-21 MED ADMIN — immun glob G(IgG)-pro-IgA 0-50 (PRIVIGEN) 10 % intravenous solution 30 g: .4 g/kg | INTRAVENOUS | @ 14:00:00 | Stop: 2022-12-21

## 2022-12-21 NOTE — Unmapped (Signed)
Pt presents for IVIG.  VSS, pt weight is 83.4kg.  Lateral port accessed, premeds administered.  Pt aware of potential reaction/side effects, call bell within reach.    0957 IVIG 30g started, to infuse at the following rates:  29ml/hr for 15 min  12ml/hr for 30 min  187ml/hr for 30 min  464ml/hr until complete.    1147 IVIG complete.  Pt tolerated without complication, VSS.  Port  flushed with NS and heparin per policy and d/c'd, gauze and coban applied.  Pt left clinic in no acute distress.

## 2022-12-23 NOTE — Unmapped (Signed)
[facility] Clinical Assessment & Refill Coordination Note    Patricia [name], DOB: 1975/09/30  Phone: [phone] (home)     All above HIPAA information was verified with patient.     Was a Nurse, learning disability used for this call? No    Specialty Medication(s):   Hematology/Oncology: Iclusig and Prevymis     Current Outpatient Medications   Medication Sig Dispense Refill    acetaminophen (TYLENOL 8 HOUR) 650 MG CR tablet Take 2 tablets (1,300 mg total) by mouth every eight (8) hours as needed for pain.      albuterol HFA 90 mcg/actuation inhaler Inhale 2 puffs every six (6) hours as needed for wheezing.      amoxicillin-clavulanate (AUGMENTIN) 875-125 mg per tablet Take 1 tablet by mouth every twelve (12) hours.      aspirin (ECOTRIN) 81 MG tablet Take 1 tablet (81 mg total) by mouth daily.      cetirizine (ZYRTEC) 10 MG tablet Take 1 tablet (10 mg total) by mouth nightly. 30 tablet 2    dapsone 100 MG tablet TAKE 1 TABLET(100 MG) BY MOUTH DAILY 30 tablet 11    escitalopram oxalate (LEXAPRO) 10 MG tablet Take 1 tablet (10 mg total) by mouth daily. 90 tablet 0    letermovir (PREVYMIS) 480 mg tablet Take 1 tablet (480 mg total) by mouth daily. 28 tablet 2    levalbuterol (XOPENEX) 0.31 mg/3 mL nebulizer solution Inhale 3 mL (0.31 mg total) by nebulization every four (4) hours as needed for wheezing (cough). 45 mL 0    norethindrone (MICRONOR) 0.35 mg tablet Take 1 tablet by mouth daily. 84 tablet 3    PONATinib (ICLUSIG) 15 mg tablet Take 1 tablet (15 mg total) by mouth daily. Swallow tablets whole. Do not crush, break, cut or chew tablets. 30 tablet 5    predniSONE (DELTASONE) 50 MG tablet Take 4 tablets (200 mg total) by mouth once daily on days 1-5 of each cycle. 20 tablet 5    tizanidine (ZANAFLEX) 4 MG tablet Take 1 tablet (4 mg total) by mouth nightly. 90 tablet 2    triamcinolone (KENALOG) 0.1 % cream Apply topically two (2) times a day for 14 days. 45 g 0    valACYclovir (VALTREX) 500 MG tablet Take 1 tablet (500 mg total) by mouth daily. 30 tablet 12     No current facility-administered medications for this visit.        Changes to medications: Hira reports no changes at this time.    Allergies   Allergen Reactions    Erythromycin Hives     Other reaction(s): Not available    Other      Pt cant take ibuprofen due to condition.    Sulfa (Sulfonamide Antibiotics) Anaphylaxis     Other reaction(s): Not available    Azithromycin        Changes to allergies: No    SPECIALTY MEDICATION ADHERENCE     Iclusig 15 mg: 5-6 days of medicine on hand   Prevymis 480 mg: 5-6 days of medicine on hand     Are there any concerns with adherence? No    Adherence counseling provided? Not needed    Patient-Reported Symptoms Tracker for Cancer Patients on Oral Chemotherapy     Oral chemotherapy medication name(s): Iclusig  Dose and frequency: 15 mg once daily  Oral Chemotherapy Start Date:    Baseline? No  Clinic(s) visited: Hematology    Symptom Grouping Question Patient Response  Digestion and Eating Have you felt sick to your stomach? Denies    Had diarrhea? Denies    Constipated? Denies    Not wanting to eat? Denies    Comments      Sleep and Pain Felt very tired even after you rest? Denies    Pain due to cancer medication or cancer? Denies    Comments     Other Side Effects Numbness or tingling in hands and/or feet? Denies    Felt short of breath? Denies    Mouth or throat Sores? Denies    Rash? Denies    Palmar-plantar erythrodysesthesia syndrome?      Rash - acneiform?      Rash - maculo-papular?      How many days over the past month did your cancer medication or cancer keep you from your normal activities?  Write in number of days, 0-30:  0    Other side effects or things you would like to discuss?      Comments?     Adherence  In the last 30 days, on how many days did you miss at least one dose of any of your [drug name]? Write in number of days, 0-30:  0    What reasons are you having trouble taking your medication [pharmacist: check all that apply]? Specify chemotherapy cycle:        No problems identified    Comments:        Comments       Optional Symptom Tracking Comments: also on Prevymis 480 mg once daily    CLINICAL MANAGEMENT AND INTERVENTION      Clinical Benefit Assessment:    Do you feel the medicine is effective or helping your condition? Yes    Clinical Benefit counseling provided? Not needed    Acute Infection Status:    Acute infections noted within Epic:  No active infections    Patient reported infection: None    Therapy Appropriateness:    Is therapy appropriate and patient progressing towards therapeutic goals? Yes, therapy is appropriate and should be continued    DISEASE/MEDICATION-SPECIFIC INFORMATION      N/A    Is the patient receiving adequate infection prevention treatment? Yes, Prevymis    Does the patient have adequate nutritional support? Not applicable    PATIENT SPECIFIC NEEDS     Does the patient have any physical, cognitive, or cultural barriers? No    Is the patient high risk? No    Did the patient require a clinical intervention? No    Does the patient require physician intervention or other additional services (i.e., nutrition, smoking cessation, social work)? No    SOCIAL DETERMINANTS OF HEALTH     At the [facility], we have learned that life circumstances - like trouble affording food, housing, utilities, or transportation can affect the health of many of our patients.   That is why we wanted to ask: are you currently experiencing any life circumstances that are negatively impacting your health and/or quality of life? Patient declined to answer    Social Determinants of Health     Financial Resource Strain: Low Risk  (11/02/2021)    Overall Financial Resource Strain (CARDIA)     Difficulty of Paying Living Expenses: Not hard at all   Internet Connectivity: Not on file   Food Insecurity: No Food Insecurity (11/02/2021)    Hunger Vital Sign     Worried About Running Out of Food in the Last Year:  Never true     Ran Out of Food in the Last Year: Never true   Tobacco Use: Medium Risk (10/26/2022)    Patient History     Smoking Tobacco Use: Former     Smokeless Tobacco Use: Never     Passive Exposure: Not on file   Housing/Utilities: Low Risk  (11/02/2021)    Housing/Utilities     Within the past 12 months, have you ever stayed: outside, in a car, in a tent, in an overnight shelter, or temporarily in someone else's home (i.e. couch-surfing)?: No     Are you worried about losing your housing?: No     Within the past 12 months, have you been unable to get utilities (heat, electricity) when it was really needed?: No   Alcohol Use: Unknown (09/10/2019)    Received from [facility] visits prior to 10/01/2022.    AUDIT-C     Frequency of Alcohol Consumption: Monthly or less     Average Number of Drinks: Not asked     Frequency of Binge Drinking: Not asked   Transportation Needs: No Transportation Needs (11/02/2021)    PRAPARE - Therapist, art (Medical): No     Lack of Transportation (Non-Medical): No   Substance Use: Not on file   Health Literacy: Not on file   Physical Activity: Not on file   Interpersonal Safety: Not on file   Stress: Not on file   Intimate Partner Violence: Not on file   Depression: Not at risk (11/15/2022)    PHQ-2     PHQ-2 Score: 0   Social Connections: Not on file     Would you be willing to receive help with any of the needs that you have identified today? Not applicable       SHIPPING     Specialty Medication(s) to be Shipped:   Hematology/Oncology: Iclusig and Prevymis    Other medication(s) to be shipped: No additional medications requested for fill at this time     Changes to insurance: No    Delivery Scheduled: Yes, Expected medication delivery date: 12/28/22.     Medication will be delivered via Next Day Courier to the confirmed prescription address in [facility].    The patient will receive a drug information handout for each medication shipped and additional FDA Medication Guides as required.  Verified that patient has previously received a Conservation officer, historic buildings and a Surveyor, mining.    The patient or caregiver noted above participated in the development of this care plan and knows that they can request review of or adjustments to the care plan at any time.      All of the patient's questions and concerns have been addressed.    Kermit Balo, [facility]   [facility] Specialty Pharmacist

## 2022-12-26 MED FILL — ICLUSIG 15 MG TABLET: ORAL | 30 days supply | Qty: 30 | Fill #1

## 2022-12-27 ENCOUNTER — Institutional Professional Consult (permissible substitution): Admit: 2022-12-27 | Discharge: 2022-12-28 | Payer: PRIVATE HEALTH INSURANCE

## 2022-12-27 DIAGNOSIS — C9100 Acute lymphoblastic leukemia not having achieved remission: Principal | ICD-10-CM

## 2022-12-27 DIAGNOSIS — C9101 Acute lymphoblastic leukemia, in remission: Principal | ICD-10-CM

## 2022-12-27 LAB — COMPREHENSIVE METABOLIC PANEL
ALBUMIN: 3.9 g/dL (ref 3.4–5.0)
ALKALINE PHOSPHATASE: 51 U/L (ref 46–116)
ALT (SGPT): 30 U/L (ref 10–49)
ANION GAP: 8 mmol/L (ref 5–14)
AST (SGOT): 31 U/L (ref <=34)
BILIRUBIN TOTAL: 0.7 mg/dL (ref 0.3–1.2)
BLOOD UREA NITROGEN: 16 mg/dL (ref 9–23)
BUN / CREAT RATIO: 23
CALCIUM: 9.5 mg/dL (ref 8.7–10.4)
CHLORIDE: 110 mmol/L — ABNORMAL HIGH (ref 98–107)
CO2: 24.8 mmol/L (ref 20.0–31.0)
CREATININE: 0.69 mg/dL
EGFR CKD-EPI (2021) FEMALE: >90 mL/min/{1.73_m2} (ref >=60)
GLUCOSE RANDOM: 101 mg/dL (ref 70–179)
POTASSIUM: 4.2 mmol/L (ref 3.4–4.8)
PROTEIN TOTAL: 7.5 g/dL (ref 5.7–8.2)
SODIUM: 143 mmol/L (ref 135–145)

## 2022-12-27 LAB — CBC W/ AUTO DIFF
BASOPHILS ABSOLUTE COUNT: 0 10*9/L (ref 0.0–0.1)
BASOPHILS RELATIVE PERCENT: 1.1 %
EOSINOPHILS ABSOLUTE COUNT: 0 10*9/L (ref 0.0–0.5)
EOSINOPHILS RELATIVE PERCENT: 1.7 %
HEMATOCRIT: 33.3 % — ABNORMAL LOW (ref 34.0–44.0)
HEMOGLOBIN: 11.7 g/dL (ref 11.3–14.9)
LYMPHOCYTES ABSOLUTE COUNT: 0.9 10*9/L — ABNORMAL LOW (ref 1.1–3.6)
LYMPHOCYTES RELATIVE PERCENT: 38.6 %
MEAN CORPUSCULAR HEMOGLOBIN CONC: 35 g/dL (ref 32.0–36.0)
MEAN CORPUSCULAR HEMOGLOBIN: 35.4 pg — ABNORMAL HIGH (ref 25.9–32.4)
MEAN CORPUSCULAR VOLUME: 101.2 fL — ABNORMAL HIGH (ref 77.6–95.7)
MEAN PLATELET VOLUME: 9.4 fL (ref 6.8–10.7)
MONOCYTES ABSOLUTE COUNT: 0.5 10*9/L (ref 0.3–0.8)
MONOCYTES RELATIVE PERCENT: 18.7 %
NEUTROPHILS ABSOLUTE COUNT: 1 10*9/L — ABNORMAL LOW (ref 1.8–7.8)
NEUTROPHILS RELATIVE PERCENT: 39.9 %
NUCLEATED RED BLOOD CELLS: 0 /100{WBCs} (ref <=4)
PLATELET COUNT: 110 10*9/L — ABNORMAL LOW (ref 150–450)
RED BLOOD CELL COUNT: 3.29 10*12/L — ABNORMAL LOW (ref 3.95–5.13)
RED CELL DISTRIBUTION WIDTH: 14.6 % (ref 12.2–15.2)
WBC ADJUSTED: 2.4 10*9/L — ABNORMAL LOW (ref 3.6–11.2)

## 2022-12-27 LAB — LIPID PANEL
CHOLESTEROL/HDL RATIO SCREEN: 6.2 — ABNORMAL HIGH (ref 1.0–4.5)
CHOLESTEROL: 231 mg/dL — ABNORMAL HIGH (ref <=200)
HDL CHOLESTEROL: 37 mg/dL — ABNORMAL LOW (ref 40–60)
LDL CHOLESTEROL CALCULATED: 144 mg/dL — ABNORMAL HIGH (ref 40–99)
NON-HDL CHOLESTEROL: 194 mg/dL — ABNORMAL HIGH (ref 70–130)
TRIGLYCERIDES: 252 mg/dL — ABNORMAL HIGH (ref 0–150)
VLDL CHOLESTEROL CAL: 50.4 mg/dL — ABNORMAL HIGH (ref 9–37)

## 2022-12-27 MED ADMIN — heparin, porcine (PF) 100 unit/mL injection 500 Units: 500 [IU] | INTRAVENOUS | @ 13:00:00 | Stop: 2022-12-27

## 2022-12-27 NOTE — Unmapped (Signed)
Clinical Support on 12/27/2022   Component Date Value Ref Range Status    WBC 12/27/2022 2.4 (L)  3.6 - 11.2 10*9/L Final    RBC 12/27/2022 3.29 (L)  3.95 - 5.13 10*12/L Final    HGB 12/27/2022 11.7  11.3 - 14.9 g/dL Final    HCT 16/05/9603 33.3 (L)  34.0 - 44.0 % Final    MCV 12/27/2022 101.2 (H)  77.6 - 95.7 fL Final    MCH 12/27/2022 35.4 (H)  25.9 - 32.4 pg Final    MCHC 12/27/2022 35.0  32.0 - 36.0 g/dL Final    RDW 54/04/8118 14.6  12.2 - 15.2 % Final    MPV 12/27/2022 9.4  6.8 - 10.7 fL Final    Platelet 12/27/2022 110 (L)  150 - 450 10*9/L Final    nRBC 12/27/2022 0  <=4 /100 WBCs Final    Neutrophils % 12/27/2022 39.9  % Final    Lymphocytes % 12/27/2022 38.6  % Final    Monocytes % 12/27/2022 18.7  % Final    Eosinophils % 12/27/2022 1.7  % Final    Basophils % 12/27/2022 1.1  % Final    Absolute Neutrophils 12/27/2022 1.0 (L)  1.8 - 7.8 10*9/L Final    Absolute Lymphocytes 12/27/2022 0.9 (L)  1.1 - 3.6 10*9/L Final    Absolute Monocytes 12/27/2022 0.5  0.3 - 0.8 10*9/L Final    Absolute Eosinophils 12/27/2022 0.0  0.0 - 0.5 10*9/L Final    Absolute Basophils 12/27/2022 0.0  0.0 - 0.1 10*9/L Final

## 2023-01-10 ENCOUNTER — Ambulatory Visit
Admit: 2023-01-10 | Discharge: 2023-01-10 | Payer: PRIVATE HEALTH INSURANCE | Attending: Adult Health | Primary: Adult Health

## 2023-01-10 ENCOUNTER — Ambulatory Visit: Admit: 2023-01-10 | Discharge: 2023-01-10 | Payer: PRIVATE HEALTH INSURANCE

## 2023-01-10 ENCOUNTER — Other Ambulatory Visit: Admit: 2023-01-10 | Discharge: 2023-01-10 | Payer: PRIVATE HEALTH INSURANCE

## 2023-01-10 LAB — CBC W/ AUTO DIFF
BASOPHILS ABSOLUTE COUNT: 0 10*9/L (ref 0.0–0.1)
BASOPHILS RELATIVE PERCENT: 1.4 %
EOSINOPHILS ABSOLUTE COUNT: 0.1 10*9/L (ref 0.0–0.5)
EOSINOPHILS RELATIVE PERCENT: 3.4 %
HEMATOCRIT: 31.4 % — ABNORMAL LOW (ref 34.0–44.0)
HEMOGLOBIN: 11.3 g/dL (ref 11.3–14.9)
LYMPHOCYTES ABSOLUTE COUNT: 1.2 10*9/L (ref 1.1–3.6)
LYMPHOCYTES RELATIVE PERCENT: 48.7 %
MEAN CORPUSCULAR HEMOGLOBIN CONC: 35.9 g/dL (ref 32.0–36.0)
MEAN CORPUSCULAR HEMOGLOBIN: 35.6 pg — ABNORMAL HIGH (ref 25.9–32.4)
MEAN CORPUSCULAR VOLUME: 99.2 fL — ABNORMAL HIGH (ref 77.6–95.7)
MEAN PLATELET VOLUME: 8.6 fL (ref 6.8–10.7)
MONOCYTES ABSOLUTE COUNT: 0.5 10*9/L (ref 0.3–0.8)
MONOCYTES RELATIVE PERCENT: 19.9 %
NEUTROPHILS ABSOLUTE COUNT: 0.7 10*9/L — ABNORMAL LOW (ref 1.8–7.8)
NEUTROPHILS RELATIVE PERCENT: 26.6 %
PLATELET COUNT: 149 10*9/L — ABNORMAL LOW (ref 150–450)
RED BLOOD CELL COUNT: 3.17 10*12/L — ABNORMAL LOW (ref 3.95–5.13)
RED CELL DISTRIBUTION WIDTH: 15.1 % (ref 12.2–15.2)
WBC ADJUSTED: 2.5 10*9/L — ABNORMAL LOW (ref 3.6–11.2)

## 2023-01-10 LAB — COMPREHENSIVE METABOLIC PANEL
ALBUMIN: 3.9 g/dL (ref 3.4–5.0)
ALKALINE PHOSPHATASE: 55 U/L (ref 46–116)
ALT (SGPT): 25 U/L (ref 10–49)
ANION GAP: 6 mmol/L (ref 5–14)
AST (SGOT): 36 U/L — ABNORMAL HIGH (ref <=34)
BILIRUBIN TOTAL: 0.5 mg/dL (ref 0.3–1.2)
BLOOD UREA NITROGEN: 14 mg/dL (ref 9–23)
BUN / CREAT RATIO: 22
CALCIUM: 9.6 mg/dL (ref 8.7–10.4)
CHLORIDE: 108 mmol/L — ABNORMAL HIGH (ref 98–107)
CO2: 26 mmol/L (ref 20.0–31.0)
CREATININE: 0.63 mg/dL
EGFR CKD-EPI (2021) FEMALE: >90 mL/min/{1.73_m2} (ref >=60)
GLUCOSE RANDOM: 119 mg/dL (ref 70–179)
POTASSIUM: 4.4 mmol/L (ref 3.4–4.8)
PROTEIN TOTAL: 7.2 g/dL (ref 5.7–8.2)
SODIUM: 140 mmol/L (ref 135–145)

## 2023-01-10 LAB — LIPASE: LIPASE: 115 U/L — ABNORMAL HIGH (ref 12–53)

## 2023-01-10 LAB — AMYLASE: AMYLASE: 161 U/L — ABNORMAL HIGH (ref 30–118)

## 2023-01-10 MED ORDER — ROSUVASTATIN 10 MG TABLET
ORAL_TABLET | Freq: Every evening | ORAL | 3 refills | 90 days | Status: CP
Start: 2023-01-10 — End: 2024-01-10
  Filled 2023-01-10: qty 90, 90d supply, fill #0

## 2023-01-10 MED ADMIN — heparin, porcine (PF) 100 unit/mL injection 500 Units: 500 [IU] | INTRAVENOUS | @ 18:00:00 | Stop: 2023-01-11

## 2023-01-10 MED ADMIN — vinCRIStine (ONCOVIN) 2 mg in sodium chloride (NS) 0.9 % 25 mL IVPB: 2 mg | INTRAVENOUS | @ 17:00:00 | Stop: 2023-01-10

## 2023-01-10 MED FILL — PREDNISONE 50 MG TABLET: 5 days supply | Qty: 20 | Fill #2

## 2023-01-10 NOTE — Unmapped (Signed)
[facility] Follow Up Visit Note     [name]  Patient Age: 47 y.o.  Encounter Date: 01/10/2023    Primary Care Provider:  [name], MD    Referring Physician:  Lenon Ahmadi, AGNP  [address]  [id number]  [place],  [place] [address]    Cancer Diagnosis: Ph+ B-ALL; Initial Dx 10/29/2021  Cancer status: CR1, BCR/ABL p190 negative in marrow  Treatment Regimen: First Line, ponatinib + Maintenance (vincristine, prednisone) s/p truncated course of GRAAPH-2005, HyperCVAD  Treatment Goal: Curative  Comorbidities: HTN, anxiety, hemorrhoids with anal fissures  Transplant: referral deferred in CR1    Assessment/Plan:  Patricia [name] is a 47 y.o. female with past medical history of chronic right shoulder and back pain, hypertension, anxiety, and recently diagnosed Ph+ ALL. 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  Given her deep molecular remission and her intolerance of hyperCVAD, we are transitioned to maintenance chemotherapy.     She is now s/p 8 Cycle Maintenance ponatinib + vincristine + prednisone. ANC has decreased to 0.7, so ponatinib will be held. Other cell counts are stable and she is doing great overall. Amylase/lipase slightly decrease, will start statin to help control lipids. She will continue vincristine+prednisone.    Continue monthly IVIG on a separate date due to insurance     Ph+ALL, in remission: On GRAAPH 2005 + dasatinib. Completed 12/12 planned IT treatments for CNS ppx.  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 09/2022 restarted Valcyte with the goal of controlling CMV and limiting neutropenia  - continue letermovir  - monthly CMV levels    Hypogammaglobulinemia:   - Monthly IVIG started 07/26/22 - now at [facility] due to insurance coverage    Hemorrhoids, Anal fissure: persistent but stable, per patient 01/10/2023  - supportive care at home    Pancytopenia due to chemotherapy  - ANC 1.2, Platelets 155K 01/10/2023  - supportive care as detailed below when in nadir    Menses suppression and birth control: previously received Lupron with last dose 07/26/22   - Micronor - daily   Will need birth control while on chemo, including when on only TKI -     Psychosocial distress: She reports a moderate level of anxiety regarding the management of the above.   - Counseling given   - Consider ref to comprehensive cancer support program     Patient-centered care/Shared decision-making:   We discussed the plan above at length. The patient and her husband actively contributed to the conversation. Specifically, her most important outcomes are:   Prolonging overall survival and Maintaining her overall functional activity     Supportive Care Needs: We recommend based on the patient???s underlying diagnosis and treatment history the following supportive care:    1. Antimicrobial prophylaxis:    Viral - valacyclovir and letermovir  Bacterial: Levofloxacin when ANC <0.5  Fungal: Fluconazole 200mg  when ANC <0.5  PJP: dapsone 100mg  daily    2. Blood product support:  Leukoreduced blood products are required.  Irradiated blood products are preferred, but in case of urgent transfusion needs non-irradiated blood products may be used: 2 units for Hg <=8.0.     Coordination of care:   - repeat labs in 2 weeks  - Continue maintenance q4 weeks       Patricia [name], AGPCNP-BC  Nurse Practitioner  Hematology/Oncology  [facility]    Patricia Aloe, MD was available  Leukemia Program  Division of Hematology  [facility]      Nurse Navigator (non-clinical trial patients): Patricia Lamp, RN        Tel. [phone]       Fax. [phone]  Toll-free appointments: [phone]  Scheduling assistance: [phone]  After hours/weekends: [phone] (ask for adult hematology/oncology on-call)      History of Present Illness:   Patricia [name] is a 47 y.o. female with past medical history noted as above who presents for follow up Ph+ ALL.      Her social history is notable for being a Runner, broadcasting/film/video.  She was recently married. She lives with her husband.    Interim history  Since her last visit    Thursday 6/20 - 6/26 - [place]  Doing well physically.  Doing craft shows. Makes her exhausted. Muscles are sore from being more active.  Left hand - mostly just tips - thumb thoruough middle finger  Left toes - ring/middle toes - get more sore - site of past fracture.  GI - no issues.  External hemorrhoid seems to improve. Internal seems to wax/wane. She is hold off on the GI appointment for now.      Oncology History is as below:   Hematology/Oncology History Overview Note   Referring/Local Oncologist:    Diagnosis:   10/29/2021  Bone marrow, left iliac, aspiration and biopsy  -  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 The majority of the abnormal cells (64/88) contained an additional BCR/ABL1 fusion signal, while 9/88 abnormal cells contained an additional ABL1 and ASS1 signal.       Genetics:   Karyotype/FISH:   RESULTS   Date Value Ref Range Status   10/28/2021   Final    NOTE: This report reflects a combined study from a peripheral blood and a bone marrow core biopsy. Eleven cells from the peripheral blood and nine cells from the bone marrow core biopsy were analyzed. The BCR/ABL1 FISH analysis was performed on the peripheral blood.     Abnormal Karyotype:  47,XX,t(9;22)(q34;q11.2),+der(22)t(9;22)[18]/46,XX[2]    Abnormal FISH:  A BCR/ABL1 interphase FISH assay showed a signal pattern consistent with a BCR::ABL1 rearrangement and the 9;22 translocation in 88% of the 100 cells scored. The majority of the abnormal cells (64/88) contained an additional BCR/ABL1 fusion signal, while 9/88 abnormal cells contained an additional ABL1 and ASS1 signal.           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  MRD by flow insufficient, p190 2/100,000  12/10/2021: Cycle 2 B cycle + rituximab  12/14/21: IT#4  12/17/21: IT#5  01/10/22: Cycle 3 A cycle + rituximab  01/11/22: IT #6  01/24/22: Bmbx - MRD-neg by PCR (p190)   02/17/22: C4 B cycle + ritux  02/18/22: IT#7  03/21/22: C5 A cycle + ritux (4th dose)  03/22/22: IT#8  04/29/22: Bmbx - MRD-neg by PCR (p190)   05/18/22: restart dasatinib 70mg  s/p neutropenia  05/26/22: Start maintenance vincristine 2mg , Prednisone 200mg  D1-5   06/02/22: IT #9  06/27/22: C2 Maintenance: vincristine 2mg , Prednisone 200mg  D1-5, HOLD dasatinib due to neutropenia  07/04/22: RESTART dasatinib 70mg   07/21/22: IT #10  07/26/22: C3 Maintenance: vincristine 2mg , Prednisone 200mg  D1-5, dasatinib 70 mg  08/08/22: IT #11 - prophylaxis completed  08/23/22: C4 Maintenance: vincristine 2mg , Prednisone 200mg  D1-5, dasatinib 70 mg  09/20/22: C5 Maintenance: vincristine 2mg , Prednisone 200mg  D1-5, dasatinib 70 mg  10/04/22: Bone marrow biopsy - continued remission, MRD and BCR/ABL p190 negative  10/18/22: C6 Maintenance. ANC 0.4, HOLD dasatinib, continue vincristine 2mg  and Prednisone 200mg  D1-5  11/15/22: C7 Maintenance ANC 0.4. HOLD TKI, continue vincristine 2mg  and Prednisone 200mg  D1-5  ~11/30/22: Start ponatinib 15 mg daily  12/13/22: C8 Maintenance ANC 1.2. ponatinib 15mg , vincristine 2mg  D1 and Prednisone 200mg  D1-5  01/10/23: C9 Maintenance Anc 0.7. HOLD ponatinib, continue vincristine 2mg  D1 and Prednisone 200mg  D1-5         Review of Systems:   ROS reviewed and negative except as noted in H and P     Allergies:  Allergies   Allergen Reactions    Erythromycin Hives     Other reaction(s): Not available    Other      Pt cant take ibuprofen due to condition.    Sulfa (Sulfonamide Antibiotics) Anaphylaxis     Other reaction(s): Not available    Azithromycin        Medications:     Current Outpatient Medications:     acetaminophen (TYLENOL 8 HOUR) 650 MG CR tablet, Take 2 tablets (1,300 mg total) by mouth every eight (8) hours as needed for pain., Disp: , Rfl:     albuterol HFA 90 mcg/actuation inhaler, Inhale 2 puffs every six (6) hours as needed for wheezing., Disp: , Rfl:     amoxicillin-clavulanate (AUGMENTIN) 875-125 mg per tablet, Take 1 tablet by mouth every twelve (12) hours., Disp: , Rfl:     aspirin (ECOTRIN) 81 MG tablet, Take 1 tablet (81 mg total) by mouth daily., Disp: , Rfl:     cetirizine (ZYRTEC) 10 MG tablet, Take 1 tablet (10 mg total) by mouth nightly., Disp: 30 tablet, Rfl: 2    dapsone 100 MG tablet, TAKE 1 TABLET(100 MG) BY MOUTH DAILY, Disp: 30 tablet, Rfl: 11    escitalopram oxalate (LEXAPRO) 10 MG tablet, Take 1 tablet (10 mg total) by mouth daily., Disp: 90 tablet, Rfl: 0    letermovir (PREVYMIS) 480 mg tablet, Take 1 tablet (480 mg total) by mouth daily., Disp: 28 tablet, Rfl: 2    levalbuterol (XOPENEX) 0.31 mg/3 mL nebulizer solution, Inhale 3 mL (0.31 mg total) by nebulization every four (4) hours as needed for wheezing (cough)., Disp: 45 mL, Rfl: 0    norethindrone (MICRONOR) 0.35 mg tablet, Take 1 tablet by mouth daily., Disp: 84 tablet, Rfl: 3    PONATinib (  ICLUSIG) 15 mg tablet, Take 1 tablet (15 mg total) by mouth daily. Swallow tablets whole. Do not crush, break, cut or chew tablets., Disp: 30 tablet, Rfl: 5    predniSONE (DELTASONE) 50 MG tablet, Take 4 tablets (200 mg total) by mouth once daily on days 1-5 of each cycle., Disp: 20 tablet, Rfl: 5    rosuvastatin (CRESTOR) 10 MG tablet, Take 1 tablet (10 mg total) by mouth nightly., Disp: 90 tablet, Rfl: 3    tizanidine (ZANAFLEX) 4 MG tablet, Take 1 tablet (4 mg total) by mouth nightly., Disp: 90 tablet, Rfl: 2    triamcinolone (KENALOG) 0.1 % cream, Apply topically two (2) times a day for 14 days., Disp: 45 g, Rfl: 0    valACYclovir (VALTREX) 500 MG tablet, Take 1 tablet (500 mg total) by mouth daily., Disp: 30 tablet, Rfl: 12  No current facility-administered medications for this visit.    Facility-Administered Medications Ordered in Other Visits:     dexAMETHasone (DECADRON) 4 mg/mL injection 20 mg, 20 mg, Intravenous, Once PRN, Lenon Ahmadi, AGNP    diphenhydrAMINE (BENADRYL) injection 25 mg, 25 mg, Intravenous, Once PRN, Lenon Ahmadi, AGNP    EPINEPHrine [facility]) injection 0.3 mg, 0.3 mg, Intramuscular, Once PRN, Lenon Ahmadi, AGNP    famotidine (PF) (PEPCID) injection 20 mg, 20 mg, Intravenous, Once PRN, Lenon Ahmadi, AGNP    heparin, porcine (PF) 100 unit/mL injection 500 Units, 500 Units, Intravenous, Q30 Min PRN, [name], MD, 500 Units at 01/10/23 1339    methylPREDNISolone sodium succinate (PF) (SOLU-Medrol) injection 125 mg, 125 mg, Intravenous, Once PRN, Dellis Anes, Ursula Alert, AGNP    OKAY TO SEND MEDICATION/CHEMOTHERAPY TO OUTPATIENT UNIT, , Other, Once, Lenon Ahmadi, AGNP    prochlorperazine (COMPAZINE) injection 10 mg, 10 mg, Intravenous, Q6H PRN, Lenon Ahmadi, AGNP    prochlorperazine (COMPAZINE) tablet 10 mg, 10 mg, Oral, Q6H PRN, Lenon Ahmadi, AGNP    sodium chloride (NS) 0.9 % infusion, 100 mL/hr, Intravenous, Continuous, Lenon Ahmadi, AGNP    sodium chloride (NS) 0.9 % infusion, 20 mL/hr, Intravenous, Continuous PRN, Lenon Ahmadi, AGNP    sodium chloride 0.9% (NS) bolus 1,000 mL, 1,000 mL, Intravenous, Once PRN, Lenon Ahmadi, AGNP    Medical History:  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    EBV infection        Social History:  Social History     Social History Narrative    Not on file       Family History:  No family history on file.    Objective:   There were no vitals taken for this visit.    Physical Exam:  GENERAL: well-appearing  woman   HEART: Normal color, not excessive pallor.  CHEST/LUNG: Normal work of breathing. No dyspnea with conversation. No crackles. Regular rate/rhythm  ABDOMEN: No obvious distention.    EXTREMITIES: No edema, cyanosis or clubbing.   SKIN: no rashes noted  NEURO EXAM: Grossly intact.       Test Results:  01/10/2023 CBC/d and CMP reviewed with patient

## 2023-01-10 NOTE — Unmapped (Signed)
The ANC is 0.7 but the other cell counts look good.  Please HOLD the ponatinib for now.  Proceed with the IV vincristine and the 5 days of steroids.    The IVIG needs to be scheduled to stay on track.    Plan for a lab check in 2 weeks.    We will see you back in 4 weeks for the next cycle.

## 2023-01-10 NOTE — Unmapped (Signed)
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 Mccambridge today to discuss plan for C9 of maintenance chemo. She reports she feels well overall and denies fevers during Cycle 8. She continues to have slight joint stiffness and only endorses myalgias when she is active. Her peripheral neuropathy remains stable. She has headaches occasionally, but attributes it to the weather and the changes in pressure. Her blood pressure in clinic today was elevated at first (SBP ~150s/110s per patient), however upon re-check it was 139/94 mmHg. She has not taken her blood pressure at home since starting ponatinib. She reports that her appetite is decent, and some days she feels extremely hungry and other days she has no appetite. She does endorse some fatigue, especially when she is participating in craft shows, however she reports this seems to be getting better. She denies chest pain, abdominal pain, and constipation/diarrhea. She reports taking her micronor with no missed doses or adverse effects. She continues to take her ponatinib, baby aspirin and all other medications as prescribed. Planning to go to [facility] next week for next IVIG dose.     Labs: WBC 2.5, ANC 0.7, Hgb 11.3, PLT 149, CMP AST 36, all others WNL/stable; amylase 161, lipase 115 (low grade & asymptomatic elevation; trended down from grade 1); lipid panel from 11/2822 with TC 231, TG 252, LDL-c 144  CMV viral load undetectable as of today 6/11  Total lgG 237 from 11/27 (now getting monthly IVIG); absolute CD4 count 276  BCR-ABL p190 (10/04/22): negative (pending 01/10/23)  BMBx (10/04/22): <5% blasts, MRD negative    Oncologic History:  Hematology/Oncology History Overview Note   Referring/Local Oncologist:    Diagnosis:   10/29/2021  Bone marrow, left iliac, aspiration and biopsy  -  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 The majority of the abnormal cells (64/88) contained an additional BCR/ABL1 fusion signal, while 9/88 abnormal cells contained an additional ABL1 and ASS1 signal.       Genetics:   Karyotype/FISH:   RESULTS   Date Value Ref Range Status   10/28/2021   Final    NOTE: This report reflects a combined study from a peripheral blood and a bone marrow core biopsy. Eleven cells from the peripheral blood and nine cells from the bone marrow core biopsy were analyzed. The BCR/ABL1 FISH analysis was performed on the peripheral blood.     Abnormal Karyotype:  47,XX,t(9;22)(q34;q11.2),+der(22)t(9;22)[18]/46,XX[2]    Abnormal FISH:  A BCR/ABL1 interphase FISH assay showed a signal pattern consistent with a BCR::ABL1 rearrangement and the 9;22 translocation in 88% of the 100 cells scored. The majority of the abnormal cells (64/88) contained an additional BCR/ABL1 fusion signal, while 9/88 abnormal cells contained an additional ABL1 and ASS1 signal.           Pertinent Phenotypic data:  CD19 98%  CD20 on  diagnosis 51%  CD22 98%      Treatment Timeline:  10/29/2021: Bone marrow biopsy: Ph+ ALL, 51% expression of CD20  10/30/21: Cycle 1 day 1 GRAAPH-2005 induction  11/03/21: ITT #1   11/12/2021: IT #2  11/18/2021: IT #3  11/29/2021: Post cycle 1 bone marrow biopsy: Morphologic CR.  MRD by flow insufficient, p190 2/100,000  12/10/2021: Cycle 2 B cycle + rituximab  12/14/21: IT#4  12/17/21: IT#5  01/10/22: Cycle 3 A cycle + rituximab  01/11/22: IT #6  01/24/22: Bmbx - MRD-neg by PCR (p190)   02/17/22: C4 B cycle + ritux  02/18/22: IT#7  03/21/22: C5 A cycle + ritux (4th dose)  03/22/22: IT#8  04/29/22: Bmbx - MRD-neg by PCR (p190)   05/18/22: restart dasatinib 70mg  s/p neutropenia  05/26/22: Start maintenance vincristine 2mg , Prednisone 200mg  D1-5   06/02/22: IT #9  06/27/22: C2 Maintenance: vincristine 2mg , Prednisone 200mg  D1-5, HOLD dasatinib due to neutropenia  07/04/22: RESTART dasatinib 70mg   07/21/22: IT #10  07/26/22: C3 Maintenance: vincristine 2mg , Prednisone 200mg  D1-5, dasatinib 70 mg  08/08/22: IT #11 - prophylaxis completed  08/23/22: C4 Maintenance: vincristine 2mg , Prednisone 200mg  D1-5, dasatinib 70 mg  09/20/22: C5 Maintenance: vincristine 2mg , Prednisone 200mg  D1-5, dasatinib 70 mg  10/04/22: Bone marrow biopsy - continued remission, MRD and BCR/ABL p190 negative  10/18/22: C6 Maintenance. ANC 0.4, HOLD dasatinib, continue vincristine 2mg  and Prednisone 200mg  D1-5  11/15/22: C7 Maintenance ANC 0.4. HOLD TKI, continue vincristine 2mg  and Prednisone 200mg  D1-5  ~11/30/22: Start ponatinib 15 mg daily  - 12/13/22: C8 Maintenance ANC 1.2. ponatinib 15mg , vincristine 2mg  D1 and Prednisone 200mg  D1-5     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)   10/29/2021 Initial Diagnosis    B-cell acute lymphoblastic leukemia (ALL) (CMS-HCC)     10/30/2021 - 03/30/2022 Chemotherapy    IP/OP LEUKEMIA GRAAPH-2005 < 60 YO (OP PEGFILGRASTIM ON DAY 7)  [No description for this plan]     01/06/2022 - 04/07/2022 Endocrine/Hormone Therapy    OP LEUPROLIDE (LUPRON) 11.25 MG EVERY 3 MONTHS  Plan Provider: [name], MD     05/26/2022 -  Chemotherapy    OP LEUKEMIA VINCRISTINE  vinCRIStine 2 mg IV on day 1     07/26/2022 - 07/26/2022 Endocrine/Hormone Therapy    OP LEUPROLIDE (LUPRON) 11.25 MG EVERY 3 MONTHS  Plan Provider: [name], MD Weight and Vitals:  Wt Readings from Last 3 Encounters:   01/10/23 84.5 kg (186 lb 4.6 oz)   12/21/22 83.4 kg (183 lb 12.8 oz)   12/13/22 85 kg (187 lb 4.8 oz)     Temp Readings from Last 3 Encounters:   01/10/23 36.6 ??C (97.9 ??F) (Oral)   12/21/22 36.1 ??C (97 ??F) (Temporal)   12/13/22 36.4 ??C (97.5 ??F) (Temporal)     BP Readings from Last 3 Encounters:   01/10/23 138/95   12/21/22 140/102   12/13/22 178/102     Pulse Readings from Last 3 Encounters:   01/10/23 79   12/21/22 79   12/13/22 95       Pertinent Labs:  Lab on 01/10/2023   Component Date Value Ref Range Status    Sodium 01/10/2023 140  135 - 145 mmol/L Final    Potassium 01/10/2023 4.4  3.4 - 4.8 mmol/L Final    Chloride 01/10/2023 108 (H)  98 - 107 mmol/L Final    CO2 01/10/2023 26.0  20.0 - 31.0 mmol/L Final    Anion Gap 01/10/2023  6  5 - 14 mmol/L Final    BUN 01/10/2023 14  9 - 23 mg/dL Final    Creatinine 16/05/9603 0.63  0.55 - 1.02 mg/dL Final    BUN/Creatinine Ratio 01/10/2023 22   Final    eGFR CKD-EPI (2021) Female 01/10/2023 >90  >=60 mL/min/1.2m2 Final    eGFR calculated with CKD-EPI 2021 equation in accordance with [employer] and [employer] of Nephrology Task Force recommendations.    Glucose 01/10/2023 119  70 - 179 mg/dL Final    Calcium 54/04/8118 9.6  8.7 - 10.4 mg/dL Final    Albumin 14/78/2956 3.9  3.4 - 5.0 g/dL Final    Total Protein 01/10/2023 7.2  5.7 - 8.2 g/dL Final    Total Bilirubin 01/10/2023 0.5  0.3 - 1.2 mg/dL Final    AST 21/30/8657 36 (H)  <=34 U/L Final    ALT 01/10/2023 25  10 - 49 U/L Final    Alkaline Phosphatase 01/10/2023 55  46 - 116 U/L Final    WBC 01/10/2023 2.5 (L)  3.6 - 11.2 10*9/L Final    RBC 01/10/2023 3.17 (L)  3.95 - 5.13 10*12/L Final    HGB 01/10/2023 11.3  11.3 - 14.9 g/dL Final    HCT 84/69/6295 31.4 (L)  34.0 - 44.0 % Final    MCV 01/10/2023 99.2 (H)  77.6 - 95.7 fL Final    MCH 01/10/2023 35.6 (H)  25.9 - 32.4 pg Final    MCHC 01/10/2023 35.9  32.0 - 36.0 g/dL Final RDW 28/41/3244 01.0  12.2 - 15.2 % Final    MPV 01/10/2023 8.6  6.8 - 10.7 fL Final    Platelet 01/10/2023 149 (L)  150 - 450 10*9/L Final    Neutrophils % 01/10/2023 26.6  % Final    Lymphocytes % 01/10/2023 48.7  % Final    Monocytes % 01/10/2023 19.9  % Final    Eosinophils % 01/10/2023 3.4  % Final    Basophils % 01/10/2023 1.4  % Final    Absolute Neutrophils 01/10/2023 0.7 (L)  1.8 - 7.8 10*9/L Final    Absolute Lymphocytes 01/10/2023 1.2  1.1 - 3.6 10*9/L Final    Absolute Monocytes 01/10/2023 0.5  0.3 - 0.8 10*9/L Final    Absolute Eosinophils 01/10/2023 0.1  0.0 - 0.5 10*9/L Final    Absolute Basophils 01/10/2023 0.0  0.0 - 0.1 10*9/L Final       Allergies:   Allergies   Allergen Reactions    Erythromycin Hives     Other reaction(s): Not available    Other      Pt cant take ibuprofen due to condition.    Sulfa (Sulfonamide Antibiotics) Anaphylaxis     Other reaction(s): Not available    Azithromycin        Drug Interactions: none identified; ponatinib is a major 3A4 substrate, caution with 3A4 inhibitors; letermovir is a mod 3A4 inhibitor (no adjustments necessary for ponatinib/VCR, monitor)      Current Medications:  Current Outpatient Medications   Medication Sig Dispense Refill    acetaminophen (TYLENOL 8 HOUR) 650 MG CR tablet Take 2 tablets (1,300 mg total) by mouth every eight (8) hours as needed for pain.      albuterol HFA 90 mcg/actuation inhaler Inhale 2 puffs every six (6) hours as needed for wheezing.      amoxicillin-clavulanate (AUGMENTIN) 875-125 mg per tablet Take 1 tablet by mouth every twelve (12) hours.      aspirin (  ECOTRIN) 81 MG tablet Take 1 tablet (81 mg total) by mouth daily.      cetirizine (ZYRTEC) 10 MG tablet Take 1 tablet (10 mg total) by mouth nightly. 30 tablet 2    dapsone 100 MG tablet TAKE 1 TABLET(100 MG) BY MOUTH DAILY 30 tablet 11    escitalopram oxalate (LEXAPRO) 10 MG tablet Take 1 tablet (10 mg total) by mouth daily. 90 tablet 0    norethindrone (MICRONOR) 0.35 mg tablet Take 1 tablet by mouth daily. 84 tablet 3    PONATinib (ICLUSIG) 15 mg tablet Take 1 tablet (15 mg total) by mouth daily. Swallow tablets whole. Do not crush, break, cut or chew tablets. 30 tablet 5    predniSONE (DELTASONE) 50 MG tablet Take 4 tablets (200 mg total) by mouth once daily on days 1-5 of each cycle. 20 tablet 5    tizanidine (ZANAFLEX) 4 MG tablet Take 1 tablet (4 mg total) by mouth nightly. 90 tablet 2    triamcinolone (KENALOG) 0.1 % cream Apply topically two (2) times a day for 14 days. 45 g 0    valACYclovir (VALTREX) 500 MG tablet Take 1 tablet (500 mg total) by mouth daily. 30 tablet 12    letermovir (PREVYMIS) 480 mg tablet Take 1 tablet (480 mg total) by mouth daily. (Patient not taking: Reported on 01/10/2023) 28 tablet 2    levalbuterol (XOPENEX) 0.31 mg/3 mL nebulizer solution Inhale 3 mL (0.31 mg total) by nebulization every four (4) hours as needed for wheezing (cough). 45 mL 0     No current facility-administered medications for this visit.       Adherence: No barriers identified/no missed doses.       Assessment: Ms.Schmuck is a 47 y.o. 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 Patient finished 5 cycles of hyperCVAD + rituximab + dasatinib before transitioning to VCR/dasatinib/prednisone maintenance . She was recently transitioned from dasatinib to ponatinib in April 2024, given difficulty with cytopenias on dasatinib (had been on dasatinib 70 mg daily and unable to titrate to normal maintenance dosing due to neutropenia). Previous cytopenias may have been CMV and/or valcyte related as well, and she was transitioned to letermovir by ICID to avoid valcyte-induced myelosuppression. CMV has recently remained undetectable and is pending from today. She was transitioned to ponatinib 15 mg daily with the thought that neutropenia may have been secondary to dasatinib (started at 15 mg dosing since BCR-ABL negative). Most recent BMBx (10/04/22) showed MRD negative disease. She is tolerating ponatinib well thus far, however ANC has dropped to below 1 and therefore ponatinib will be held for 2 weeks. Plan to repeat labs in 2 weeks to evaluate if ponatinib can be re-started. Amylase/lipase elevated though she remains asymptomatic. Will CTM. Most recent LDL-c elevated to 144, so she is indicated for a moderate intensity statin. Will start rosuvastatin 10 mg daily today (DDI with atorvastatin and letermovir). Receiving IVIG to minimize infectious complications.     Plan:   1) Ph+ ALL  - Continue vincristine IV on day 1 of 28 day cycle (due today)  - Continue prednisone 200 mg (4 tablets) days 1-5 of 28 day cycle (will restart today)  - HOLD ponatinib 15 mg daily; re-check labs in 2 weeks, ok to restart when ANC > 1.5 per labeling (can consider when > 1.0 to minimize time held since her baseline ANC has been low)   - Continue aspirin 81 mg with ponatinib  - START rosuvastatin  10 mg daily with ponatinib   - Repeat fasting lipid panel in 4-6 weeks   - Tend amylase/lipase, however elevations are asymptomatic at this time  - #12 of 12 IT therapy complete  - Labs in 2 weeks (6/25), RTC in 4 weeks ahead of C10  - BCR-ABL continues to be negative (most recent 10/04/22 & pending 01/10/23)    2) Infection ppx   -If ANC < 0.5, add levofloxacin 500 mg daily and fluconazole 200 mg daily  -Continue dapsone 100 mg PO daily for PJP ppx  -Continue letermovir 480 mg daily per ICID (started 10/05/22)   -Continue valacyclovir 500 mg daily (will hold if needing to resume valganciclovir)   -Continue IVIG - dose due 6/17 and continue every 4 weeks (scheduled to be given at [facility])  -CMV will now only be checked monthly (pending from 01/10/23)    3) Supportive Care  - Continue Tylenol prn for pain. Check temperature before Tylenol dose and avoid taking NSAIDs.  - Can take Dulcolax prn for constipation  - Can take famotidine if having acid reflex from steroids.   - Most recent dose of Lupron on 07/26/22, which was final dose. Currently on daily Micronor. She can follow with gynecology for alternatives as needed.     F/u:  Future Appointments   Date Time Provider Department Center   01/10/2023 11:15 AM ONCINF CHAIR 22 [id number]   01/16/2023 11:30 AM UNCTIF 20 [facility]CTHERINFET [facility]   02/14/2023  9:30 AM Ortencia Kick, MD ONCMULTI [facility]   03/01/2023 10:00 AM Bill Salinas, MD [id number]     I spent 30 minutes in direct patient care    Janice Norrie, PharmD   PGY-2 Oncology Pharmacy Resident       Manfred Arch, PharmD, BCOP, CPP  Pager: [phone]

## 2023-01-10 NOTE — Unmapped (Signed)
Patient tolerated Vincristine infusion without complication. Port hep locked and dc'ed; no sign of infiltration. No questions/concerns.

## 2023-01-11 LAB — CMV DNA, QUANTITATIVE, PCR: CMV VIRAL LD: NOT DETECTED

## 2023-01-15 DIAGNOSIS — C9100 Acute lymphoblastic leukemia not having achieved remission: Principal | ICD-10-CM

## 2023-01-15 MED ORDER — CETIRIZINE 10 MG TABLET
ORAL_TABLET | Freq: Every evening | ORAL | 2 refills | 30 days | Status: CP
Start: 2023-01-15 — End: 2024-01-15
  Filled 2023-01-16: qty 30, 30d supply, fill #0

## 2023-01-15 MED ORDER — ESCITALOPRAM 10 MG TABLET
ORAL_TABLET | Freq: Every day | ORAL | 0 refills | 90 days | Status: CP
Start: 2023-01-15 — End: ?
  Filled 2023-01-16: qty 90, 90d supply, fill #0

## 2023-01-16 ENCOUNTER — Ambulatory Visit: Admit: 2023-01-16 | Discharge: 2023-01-17 | Payer: PRIVATE HEALTH INSURANCE

## 2023-01-16 MED ADMIN — diphenhydrAMINE (BENADRYL) capsule/tablet 25 mg: 25 mg | ORAL | @ 15:00:00 | Stop: 2023-01-16

## 2023-01-16 MED ADMIN — acetaminophen (TYLENOL) tablet 650 mg: 650 mg | ORAL | @ 15:00:00 | Stop: 2023-01-16

## 2023-01-16 MED ADMIN — immun glob G(IgG)-pro-IgA 0-50 (PRIVIGEN) 10 % intravenous solution 30 g: .4 g/kg | INTRAVENOUS | @ 16:00:00 | Stop: 2023-01-16

## 2023-01-16 MED ADMIN — heparin, porcine (PF) 100 unit/mL injection 500 Units: 500 [IU] | INTRAVENOUS | @ 17:00:00 | Stop: 2023-01-16

## 2023-01-16 MED FILL — PREVYMIS 480 MG TABLET: ORAL | 28 days supply | Qty: 28 | Fill #0

## 2023-01-16 NOTE — Unmapped (Signed)
Pt presents for IVIG.  VSS, pt weight is 84.4kg.  IV placed, premeds administered.  Pt aware of potential reaction/side effects, call bell within reach.    1130 IVIG 30g started, to infuse at the following rates:  38ml/hr for 15 min  19ml/hr for 30 min  171ml/hr for 30 min  463ml/hr until complete.    1311 IVIG complete.  Pt tolerated without complication, VSS.  IV flushed per policy and d/c'd, gauze and coban applied.  Pt left clinic in no acute distress.

## 2023-01-19 NOTE — Unmapped (Signed)
Pat[facility] [name] has been contacted in regards to their refill of Iclusig 15 mg . At this time, they have declined refill due to medication being on hold. Refill assessment call date has been updated per the patient's request.

## 2023-01-26 ENCOUNTER — Institutional Professional Consult (permissible substitution): Admit: 2023-01-26 | Discharge: 2023-01-27 | Payer: PRIVATE HEALTH INSURANCE

## 2023-01-26 DIAGNOSIS — C9100 Acute lymphoblastic leukemia not having achieved remission: Principal | ICD-10-CM

## 2023-01-26 LAB — CBC W/ AUTO DIFF
BASOPHILS ABSOLUTE COUNT: 0 10*9/L (ref 0.0–0.1)
BASOPHILS RELATIVE PERCENT: 1 %
EOSINOPHILS ABSOLUTE COUNT: 0.1 10*9/L (ref 0.0–0.5)
EOSINOPHILS RELATIVE PERCENT: 2 %
HEMATOCRIT: 32.4 % — ABNORMAL LOW (ref 34.0–44.0)
HEMOGLOBIN: 11.2 g/dL — ABNORMAL LOW (ref 11.3–14.9)
LYMPHOCYTES ABSOLUTE COUNT: 1.2 10*9/L (ref 1.1–3.6)
LYMPHOCYTES RELATIVE PERCENT: 35.7 %
MEAN CORPUSCULAR HEMOGLOBIN CONC: 34.5 g/dL (ref 32.0–36.0)
MEAN CORPUSCULAR HEMOGLOBIN: 35.8 pg — ABNORMAL HIGH (ref 25.9–32.4)
MEAN CORPUSCULAR VOLUME: 103.7 fL — ABNORMAL HIGH (ref 77.6–95.7)
MEAN PLATELET VOLUME: 9.2 fL (ref 6.8–10.7)
MONOCYTES ABSOLUTE COUNT: 0.4 10*9/L (ref 0.3–0.8)
MONOCYTES RELATIVE PERCENT: 11.7 %
NEUTROPHILS ABSOLUTE COUNT: 1.7 10*9/L — ABNORMAL LOW (ref 1.8–7.8)
NEUTROPHILS RELATIVE PERCENT: 49.6 %
NUCLEATED RED BLOOD CELLS: 0 /100{WBCs} (ref <=4)
PLATELET COUNT: 146 10*9/L — ABNORMAL LOW (ref 150–450)
RED BLOOD CELL COUNT: 3.13 10*12/L — ABNORMAL LOW (ref 3.95–5.13)
RED CELL DISTRIBUTION WIDTH: 16.7 % — ABNORMAL HIGH (ref 12.2–15.2)
WBC ADJUSTED: 3.4 10*9/L — ABNORMAL LOW (ref 3.6–11.2)

## 2023-01-26 LAB — COMPREHENSIVE METABOLIC PANEL
ALBUMIN: 3.7 g/dL (ref 3.4–5.0)
ALKALINE PHOSPHATASE: 48 U/L (ref 46–116)
ALT (SGPT): 52 U/L — ABNORMAL HIGH (ref 10–49)
ANION GAP: 5 mmol/L (ref 5–14)
AST (SGOT): 44 U/L — ABNORMAL HIGH (ref <=34)
BILIRUBIN TOTAL: 0.5 mg/dL (ref 0.3–1.2)
BLOOD UREA NITROGEN: 11 mg/dL (ref 9–23)
BUN / CREAT RATIO: 17
CALCIUM: 9.2 mg/dL (ref 8.7–10.4)
CHLORIDE: 109 mmol/L — ABNORMAL HIGH (ref 98–107)
CO2: 28.6 mmol/L (ref 20.0–31.0)
CREATININE: 0.63 mg/dL
EGFR CKD-EPI (2021) FEMALE: >90 mL/min/{1.73_m2} (ref >=60)
GLUCOSE RANDOM: 155 mg/dL (ref 70–179)
POTASSIUM: 3.8 mmol/L (ref 3.4–4.8)
PROTEIN TOTAL: 6.8 g/dL (ref 5.7–8.2)
SODIUM: 143 mmol/L (ref 135–145)

## 2023-01-26 MED ADMIN — heparin, porcine (PF) 100 unit/mL injection 500 Units: 500 [IU] | INTRAVENOUS | @ 18:00:00 | Stop: 2023-01-26

## 2023-02-07 ENCOUNTER — Other Ambulatory Visit: Admit: 2023-02-07 | Discharge: 2023-02-07 | Payer: PRIVATE HEALTH INSURANCE

## 2023-02-07 ENCOUNTER — Ambulatory Visit: Admit: 2023-02-07 | Discharge: 2023-02-07 | Payer: PRIVATE HEALTH INSURANCE

## 2023-02-07 ENCOUNTER — Ambulatory Visit
Admit: 2023-02-07 | Discharge: 2023-02-07 | Payer: PRIVATE HEALTH INSURANCE | Attending: Adult Health | Primary: Adult Health

## 2023-02-07 DIAGNOSIS — C9100 Acute lymphoblastic leukemia not having achieved remission: Principal | ICD-10-CM

## 2023-02-07 DIAGNOSIS — B259 Cytomegaloviral disease, unspecified: Principal | ICD-10-CM

## 2023-02-07 DIAGNOSIS — D801 Nonfamilial hypogammaglobulinemia: Principal | ICD-10-CM

## 2023-02-07 LAB — COMPREHENSIVE METABOLIC PANEL
ALBUMIN: 3.9 g/dL (ref 3.4–5.0)
ALKALINE PHOSPHATASE: 54 U/L (ref 46–116)
ALT (SGPT): 27 U/L (ref 10–49)
ANION GAP: 6 mmol/L (ref 5–14)
AST (SGOT): 31 U/L (ref <=34)
BILIRUBIN TOTAL: 0.5 mg/dL (ref 0.3–1.2)
BLOOD UREA NITROGEN: 23 mg/dL (ref 9–23)
BUN / CREAT RATIO: 33
CALCIUM: 9.6 mg/dL (ref 8.7–10.4)
CHLORIDE: 110 mmol/L — ABNORMAL HIGH (ref 98–107)
CO2: 27 mmol/L (ref 20.0–31.0)
CREATININE: 0.69 mg/dL
EGFR CKD-EPI (2021) FEMALE: >90 mL/min/{1.73_m2} (ref >=60)
GLUCOSE RANDOM: 147 mg/dL (ref 70–179)
POTASSIUM: 3.8 mmol/L (ref 3.4–4.8)
PROTEIN TOTAL: 7.1 g/dL (ref 5.7–8.2)
SODIUM: 143 mmol/L (ref 135–145)

## 2023-02-07 LAB — CMV DNA, QUANTITATIVE, PCR: CMV VIRAL LD: NOT DETECTED

## 2023-02-07 LAB — CBC W/ AUTO DIFF
BASOPHILS ABSOLUTE COUNT: 0 10*9/L (ref 0.0–0.1)
BASOPHILS RELATIVE PERCENT: 1.2 %
EOSINOPHILS ABSOLUTE COUNT: 0.1 10*9/L (ref 0.0–0.5)
EOSINOPHILS RELATIVE PERCENT: 3.3 %
HEMATOCRIT: 34.4 % (ref 34.0–44.0)
HEMOGLOBIN: 12.3 g/dL (ref 11.3–14.9)
LYMPHOCYTES ABSOLUTE COUNT: 1.2 10*9/L (ref 1.1–3.6)
LYMPHOCYTES RELATIVE PERCENT: 38.2 %
MEAN CORPUSCULAR HEMOGLOBIN CONC: 35.7 g/dL (ref 32.0–36.0)
MEAN CORPUSCULAR HEMOGLOBIN: 35.7 pg — ABNORMAL HIGH (ref 25.9–32.4)
MEAN CORPUSCULAR VOLUME: 100.2 fL — ABNORMAL HIGH (ref 77.6–95.7)
MEAN PLATELET VOLUME: 9.1 fL (ref 6.8–10.7)
MONOCYTES ABSOLUTE COUNT: 0.4 10*9/L (ref 0.3–0.8)
MONOCYTES RELATIVE PERCENT: 13.5 %
NEUTROPHILS ABSOLUTE COUNT: 1.4 10*9/L — ABNORMAL LOW (ref 1.8–7.8)
NEUTROPHILS RELATIVE PERCENT: 43.8 %
PLATELET COUNT: 146 10*9/L — ABNORMAL LOW (ref 150–450)
RED BLOOD CELL COUNT: 3.44 10*12/L — ABNORMAL LOW (ref 3.95–5.13)
RED CELL DISTRIBUTION WIDTH: 15.3 % — ABNORMAL HIGH (ref 12.2–15.2)
WBC ADJUSTED: 3.2 10*9/L — ABNORMAL LOW (ref 3.6–11.2)

## 2023-02-07 MED ORDER — LOSARTAN 50 MG TABLET
ORAL | 3 refills | 30 days | Status: CP
Start: 2023-02-07 — End: 2024-02-07
  Filled 2023-02-07: qty 30, 30d supply, fill #0

## 2023-02-07 MED ADMIN — vinCRIStine (ONCOVIN) 2 mg in sodium chloride (NS) 0.9 % 25 mL IVPB: 2 mg | INTRAVENOUS | @ 17:00:00 | Stop: 2023-02-07

## 2023-02-07 MED ADMIN — sodium chloride (NS) 0.9 % infusion: 100 mL/h | INTRAVENOUS | @ 17:00:00

## 2023-02-07 MED ADMIN — heparin, porcine (PF) 100 unit/mL injection 500 Units: 500 [IU] | INTRAVENOUS | @ 17:00:00 | Stop: 2023-02-08

## 2023-02-07 MED FILL — PREDNISONE 50 MG TABLET: 5 days supply | Qty: 20 | Fill #3

## 2023-02-07 MED FILL — PREVYMIS 480 MG TABLET: ORAL | 28 days supply | Qty: 28 | Fill #1

## 2023-02-07 NOTE — Unmapped (Addendum)
You are doing well!  You will proceed with Cycle 10 without any changes.  IV vincristine today  Prednisone daily for 5 days.  Continue the ponatinib (and aspirin) daily.    We will see you back in 4 weeks for the next cycle

## 2023-02-07 NOTE — Unmapped (Unsigned)
Port accessed.  Labs drawn & sent for analysis.  Patient sent to next appointment. Care provided by Kim Oliver, RN

## 2023-02-07 NOTE — Unmapped (Addendum)
[facility]ukemia [facility] Follow Up Visit Note     [name]  Patient Age: 47 y.o.  Encounter Date: 02/07/2023    Primary Care Provider:  [name], MD    Referring Physician:  Oneita Hurt, None Per Patient  [address] [place],  [place] [address]    Cancer Diagnosis: Ph+ B-ALL; Initial Dx 10/29/2021  Cancer status: CR1, BCR/ABL p190 negative in marrow  Treatment Regimen: First Line, ponatinib + Maintenance (vincristine, prednisone) s/p truncated course of GRAAPH-2005, HyperCVAD  Treatment Goal: Curative  Comorbidities: HTN, anxiety, hemorrhoids with anal fissures  Transplant: referral deferred in CR1    Assessment/Plan:  Patricia [name] is a 47 y.o. female with past medical history of chronic right shoulder and back pain, hypertension, anxiety, and recently diagnosed Ph+ ALL. 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  Given her deep molecular remission and her intolerance of hyperCVAD, we are transitioned to maintenance chemotherapy.     She is now s/p 9 Cycle Maintenance ponatinib + vincristine + prednisone. ANC is stable after Ponatinib was restarted 6/27 (after a hold due to neutropenia). She is clear to proceed with the next cycle.  Trend amylase/lipase for next 2 months on ponatinib - they are just mildly elevated thus far.  BP is slightly elevated. May be due to ponatinib. Start losartan.    She is going to taper her Lexapro due to concern for weight gain. Also has concerns about birth control. I recommended she see her GYN provider to re-establish care and discuss options. Birth control, of some type, while on dasatinib is necessary.    Continue monthly IVIG on a separate date due to insurance     Ph+ALL, in remission: On GRAAPH 2005 + dasatinib. Completed 12/12 planned IT treatments for CNS ppx.  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 09/2022 restarted Valcyte with the goal of controlling CMV and limiting neutropenia  - continue letermovir  - monthly CMV levels    Hypogammaglobulinemia:   - Monthly IVIG started 07/26/22 - now at [facility] due to insurance coverage    Hemorrhoids, Anal fissure: persistent but stable, per patient 02/07/2023  - supportive care at home    Pancytopenia due to chemotherapy  - ANC and Platelets sufficient to proceed without changes 02/07/2023  - supportive care as detailed below when in nadir    Menses suppression and birth control: previously received Lupron with last dose 07/26/22   - Micronor - daily   Will need birth control while on chemo, including when on only TKI     Psychosocial distress: She reports a moderate level of anxiety regarding the management of the above.   - Counseling given   - Consider ref to comprehensive cancer support program     Patient-centered care/Shared decision-making:   We  discussed the plan above at length. The patient and her husband actively contributed to the conversation. Specifically, her most important outcomes are:   Prolonging overall survival and Maintaining her overall functional activity     Supportive Care Needs: We recommend based on the patient???s underlying diagnosis and treatment history the following supportive care:    1. Antimicrobial prophylaxis:    Viral - valacyclovir and letermovir  Bacterial: Levofloxacin when ANC <0.5  Fungal: Fluconazole 200mg  when ANC <0.5  PJP: dapsone 100mg  daily    2. Blood product support:  Leukoreduced blood products are required.  Irradiated blood products are preferred, but in case of urgent transfusion needs non-irradiated blood products may be used: 2 units for Hg <=8.0.     Coordination of care:   - Continue maintenance q4 weeks       [name], AGPCNP-BC  Nurse Practitioner  Hematology/Oncology  [facility]    [name], MD was available  Leukemia Program  Division of Hematology  [facility]      Nurse Navigator (non-clinical trial patients): Elicia Lamp, RN        Tel. [phone]       Fax. [phone]  Toll-free appointments: [phone]  Scheduling assistance: [phone]  After hours/weekends: [phone] (ask for adult hematology/oncology on-call)      History of Present Illness:   Patricia [name] is a 47 y.o. female with past medical history noted as above who presents for follow up Ph+ ALL.      Her social history is notable for being a Runner, broadcasting/film/video.  She was recently married. She lives with her husband.    Interim history  Since her last visit    Traveled to [place] and it went well. She did have a mechanical fall (tripped while walking in a field) but no injury.  Doing well physically.  She is concerned about weight gain and would like to stop the Lexapro and her birth control. She feels the Lexapro is not needed right now.  Left hand - mostly just tips - thumb thoruough middle finger  Left toes - ring/middle toes - get more sore - site of past fracture.  GI - no issues.        Oncology History is as below:   Hematology/Oncology History Overview Note   Referring/Local Oncologist:    Diagnosis:   10/29/2021  Bone marrow, left iliac, aspiration and biopsy  -  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 The majority of the abnormal cells (64/88) contained an additional BCR/ABL1 fusion signal, while 9/88 abnormal cells contained an additional ABL1 and ASS1 signal.       Genetics:   Karyotype/FISH:   RESULTS   Date Value Ref Range Status   10/28/2021   Final    NOTE: This report reflects a combined study from a peripheral blood and a bone marrow core biopsy. Eleven cells from the peripheral blood and nine cells from the bone marrow core biopsy were analyzed. The BCR/ABL1 FISH analysis was performed on the peripheral blood.     Abnormal Karyotype:  47,XX,t(9;22)(q34;q11.2),+der(22)t(9;22)[18]/46,XX[2]    Abnormal FISH:  A BCR/ABL1 interphase FISH assay showed a signal pattern consistent with a BCR::ABL1  rearrangement and the 9;22 translocation in 88% of the 100 cells scored. The majority of the abnormal cells (64/88) contained an additional BCR/ABL1 fusion signal, while 9/88 abnormal cells contained an additional ABL1 and ASS1 signal.           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  MRD by flow insufficient, p190 2/100,000  12/10/2021: Cycle 2 B cycle + rituximab  12/14/21: IT#4  12/17/21: IT#5  01/10/22: Cycle 3 A cycle + rituximab  01/11/22: IT #6  01/24/22: Bmbx - MRD-neg by PCR (p190)   02/17/22: C4 B cycle + ritux  02/18/22: IT#7  03/21/22: C5 A cycle + ritux (4th dose)  03/22/22: IT#8  04/29/22: Bmbx - MRD-neg by PCR (p190)   05/18/22: restart dasatinib 70mg  s/p neutropenia  05/26/22: Start maintenance vincristine 2mg , Prednisone 200mg  D1-5   06/02/22: IT #9  06/27/22: C2 Maintenance: vincristine 2mg , Prednisone 200mg  D1-5, HOLD dasatinib due to neutropenia  07/04/22: RESTART dasatinib 70mg   07/21/22: IT #10  07/26/22: C3 Maintenance: vincristine 2mg , Prednisone 200mg  D1-5, dasatinib 70 mg  08/08/22: IT #11 - prophylaxis completed  08/23/22: C4 Maintenance: vincristine 2mg , Prednisone 200mg  D1-5, dasatinib 70 mg  09/20/22: C5 Maintenance: vincristine 2mg , Prednisone 200mg  D1-5, dasatinib 70 mg  10/04/22: Bone marrow biopsy - continued remission, MRD and BCR/ABL p190 negative  10/18/22: C6 Maintenance. ANC 0.4, HOLD dasatinib, continue vincristine 2mg  and Prednisone 200mg  D1-5  11/15/22: C7 Maintenance ANC 0.4. HOLD TKI, continue vincristine 2mg  and Prednisone 200mg  D1-5  ~11/30/22: Start ponatinib 15 mg daily  12/13/22: C8 Maintenance ANC 1.2. ponatinib 15mg , vincristine 2mg  D1 and Prednisone 200mg  D1-5  01/10/23: C9 Maintenance Anc 0.7. HOLD ponatinib, continue vincristine 2mg  D1 and Prednisone 200mg  D1-5  01/26/23: ANC 1.7, restart ponatinib 15 mg  02/08/23: C10 Maintenance. ANC is 1.4 - continue ponatinib         Review of Systems:   ROS reviewed and negative except as noted in H and P     Allergies:  Allergies   Allergen Reactions    Erythromycin Hives     Other reaction(s): Not available    Other      Pt cant take ibuprofen due to condition.    Sulfa (Sulfonamide Antibiotics) Anaphylaxis     Other reaction(s): Not available    Azithromycin        Medications:     Current Outpatient Medications:     acetaminophen (TYLENOL 8 HOUR) 650 MG CR tablet, Take 2 tablets (1,300 mg total) by mouth every eight (8) hours as needed for pain., Disp: , Rfl:     albuterol HFA 90 mcg/actuation inhaler, Inhale 2 puffs every six (6) hours as needed for wheezing., Disp: , Rfl:     aspirin (ECOTRIN) 81 MG tablet, Take 1 tablet (81 mg total) by mouth daily., Disp: , Rfl:     cetirizine (ZYRTEC) 10 MG tablet, Take 1 tablet (10 mg total) by mouth nightly., Disp: 30 tablet, Rfl: 2    dapsone 100 MG tablet, TAKE 1 TABLET(100 MG) BY MOUTH DAILY, Disp: 30 tablet, Rfl: 11    letermovir (PREVYMIS) 480 mg tablet, Take 1 tablet (480 mg total) by mouth daily.,  Disp: 28 tablet, Rfl: 2    norethindrone (MICRONOR) 0.35 mg tablet, Take 1 tablet by mouth daily., Disp: 84 tablet, Rfl: 3    PONATinib (ICLUSIG) 15 mg tablet, Take 1 tablet (15 mg total) by mouth daily. Swallow tablets whole. Do not crush, break, cut or chew tablets., Disp: 30 tablet, Rfl: 5    predniSONE (DELTASONE) 50 MG tablet, Take 4 tablets (200 mg total) by mouth once daily on days 1-5 of each cycle., Disp: 20 tablet, Rfl: 5    rosuvastatin (CRESTOR) 10 MG tablet, Take 1 tablet (10 mg total) by mouth nightly., Disp: 90 tablet, Rfl: 3    tizanidine (ZANAFLEX) 4 MG tablet, Take 1 tablet (4 mg total) by mouth nightly., Disp: 90 tablet, Rfl: 2    triamcinolone (KENALOG) 0.1 % cream, Apply topically two (2) times a day for 14 days., Disp: 45 g, Rfl: 0    valACYclovir (VALTREX) 500 MG tablet, Take 1 tablet (500 mg total) by mouth daily., Disp: 30 tablet, Rfl: 12    levalbuterol (XOPENEX) 0.31 mg/3 mL nebulizer solution, Inhale 3 mL (0.31 mg total) by nebulization every four (4) hours as needed for wheezing (cough)., Disp: 45 mL, Rfl: 0    losartan (COZAAR) 50 MG tablet, Take 1 tablet (50 mg total) by mouth daily., Disp: 30 tablet, Rfl: 3    Medical History:  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    EBV infection        Social History:  Social History     Social History Narrative    Not on file       Family History:  No family history on file.    Objective:   BP (S) 132/94 Comment: BP Recheck, Previous BP 150/102(patient says it maybe due to birth control pill) - Pulse 89  - Temp 36.7 ??C (98 ??F) (Oral)  - Resp 18  - Ht 170.2 cm (5' 7.01)  - Wt 85.5 kg (188 lb 7.9 oz)  - SpO2 97%  - BMI 29.52 kg/m??     Physical Exam:  GENERAL: well-appearing  woman   HEART: Normal color, not excessive pallor.  CHEST/LUNG: Normal work of breathing. No dyspnea with conversation. No crackles. Regular rate/rhythm  ABDOMEN: No obvious distention. EXTREMITIES: No edema, cyanosis or clubbing.   SKIN: no rashes noted  NEURO EXAM: Grossly intact.       Test Results:  02/07/2023 CBC/d and CMP reviewed with patient

## 2023-02-07 NOTE — Unmapped (Signed)
Lab on 02/07/2023   Component Date Value Ref Range Status    Sodium 02/07/2023 143  135 - 145 mmol/L Final    Potassium 02/07/2023 3.8  3.4 - 4.8 mmol/L Final    Chloride 02/07/2023 110 (H)  98 - 107 mmol/L Final    CO2 02/07/2023 27.0  20.0 - 31.0 mmol/L Final    Anion Gap 02/07/2023 6  5 - 14 mmol/L Final    BUN 02/07/2023 23  9 - 23 mg/dL Final    Creatinine 16/05/9603 0.69  0.55 - 1.02 mg/dL Final    BUN/Creatinine Ratio 02/07/2023 33   Final    eGFR CKD-EPI (2021) Female 02/07/2023 >90  >=60 mL/min/1.61m2 Final    eGFR calculated with CKD-EPI 2021 equation in accordance with [employer] and [employer] of Nephrology Task Force recommendations.    Glucose 02/07/2023 147  70 - 179 mg/dL Final    Calcium 54/04/8118 9.6  8.7 - 10.4 mg/dL Final    Albumin 14/78/2956 3.9  3.4 - 5.0 g/dL Final    Total Protein 02/07/2023 7.1  5.7 - 8.2 g/dL Final    Total Bilirubin 02/07/2023 0.5  0.3 - 1.2 mg/dL Final    AST 21/30/8657 31  <=34 U/L Final    ALT 02/07/2023 27  10 - 49 U/L Final    Alkaline Phosphatase 02/07/2023 54  46 - 116 U/L Final    WBC 02/07/2023 3.2 (L)  3.6 - 11.2 10*9/L Final    RBC 02/07/2023 3.44 (L)  3.95 - 5.13 10*12/L Final    HGB 02/07/2023 12.3  11.3 - 14.9 g/dL Final    HCT 84/69/6295 34.4  34.0 - 44.0 % Final    MCV 02/07/2023 100.2 (H)  77.6 - 95.7 fL Final    MCH 02/07/2023 35.7 (H)  25.9 - 32.4 pg Final    MCHC 02/07/2023 35.7  32.0 - 36.0 g/dL Final    RDW 28/41/3244 15.3 (H)  12.2 - 15.2 % Final    MPV 02/07/2023 9.1  6.8 - 10.7 fL Final    Platelet 02/07/2023 146 (L)  150 - 450 10*9/L Final    Neutrophils % 02/07/2023 43.8  % Final    Lymphocytes % 02/07/2023 38.2  % Final    Monocytes % 02/07/2023 13.5  % Final    Eosinophils % 02/07/2023 3.3  % Final    Basophils % 02/07/2023 1.2  % Final    Absolute Neutrophils 02/07/2023 1.4 (L)  1.8 - 7.8 10*9/L Final    Absolute Lymphocytes 02/07/2023 1.2  1.1 - 3.6 10*9/L Final    Absolute Monocytes 02/07/2023 0.4  0.3 - 0.8 10*9/L Final    Absolute Eosinophils 02/07/2023 0.1  0.0 - 0.5 10*9/L Final    Absolute Basophils 02/07/2023 0.0  0.0 - 0.1 10*9/L Final

## 2023-02-08 NOTE — Unmapped (Signed)
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 today to discuss plan for C10 of maintenance chemo. She is feeling well overall despite a couple clumsy moments. She is most concerned about weight gain that she recognizes in herself over time, although her weight has been stable over last 6 months. She first was concerned that lexapro and the micronor are contributing and wants to stop these. However she's hoping for improved libido when stopping lexapro so agrees the birth control should remain active. She comments on pain with sex, hasn't tried lube yet but states in the past when she's used it it burns. 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 She notes that her blood pressure has steadily increased, which she correlates with birth control based on past experience but reminded her ponatinib can cause this. She has some fatigue, but otherwise denies chest pain, abdominal pain, and constipation/diarrhea. She continues to take her ponatinib, baby aspirin and all other medications as prescribed. Planning to go to [facility] next week for next IVIG dose.     Labs: WBC 3.2, ANC 1.4, Hgb 12.3, PLT 146, CMP WNL, amylase/lipase added on (asymptomatic)  lipid panel from 11/2822 with TC 231, TG 252, LDL-c 144  CMV viral load undetectable as of today 7/9  Total lgG 237 from 11/27 (now getting monthly IVIG); absolute CD4 count 276  BCR-ABL p190 6/11 negative  BMBx (10/04/22): <5% blasts, MRD negative    Oncologic History:  Hematology/Oncology History Overview Note   Referring/Local Oncologist:    Diagnosis:   10/29/2021  Bone marrow, left iliac, aspiration and biopsy  -  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 The majority of the abnormal cells (64/88) contained an additional BCR/ABL1 fusion signal, while 9/88 abnormal cells contained an additional ABL1 and ASS1 signal.       Genetics:   Karyotype/FISH:   RESULTS   Date Value Ref Range Status   10/28/2021   Final    NOTE: This report reflects a combined study from a peripheral blood and a bone marrow core biopsy. Eleven cells from the peripheral blood and nine cells from the bone marrow core biopsy were analyzed. The BCR/ABL1 FISH analysis was performed on the peripheral blood.     Abnormal Karyotype:  47,XX,t(9;22)(q34;q11.2),+der(22)t(9;22)[18]/46,XX[2]    Abnormal FISH:  A BCR/ABL1 interphase FISH assay showed a signal pattern consistent with a BCR::ABL1 rearrangement and the 9;22 translocation in 88% of the 100 cells scored. The majority of the abnormal cells (64/88) contained an additional BCR/ABL1 fusion signal, while 9/88 abnormal cells contained an additional ABL1 and ASS1 signal.           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  MRD by flow insufficient, p190 2/100,000  12/10/2021: Cycle 2 B cycle + rituximab  12/14/21: IT#4  12/17/21: IT#5  01/10/22: Cycle 3 A cycle + rituximab  01/11/22: IT #6  01/24/22: Bmbx - MRD-neg by PCR (p190)   02/17/22: C4 B cycle + ritux  02/18/22: IT#7  03/21/22: C5 A cycle + ritux (4th dose)  03/22/22: IT#8  04/29/22: Bmbx - MRD-neg by PCR (p190)   05/18/22: restart dasatinib 70mg  s/p neutropenia  05/26/22: Start maintenance vincristine 2mg , Prednisone 200mg  D1-5   06/02/22: IT #9  06/27/22: C2 Maintenance: vincristine 2mg , Prednisone 200mg  D1-5, HOLD dasatinib due to neutropenia  07/04/22: RESTART dasatinib 70mg   07/21/22: IT #10  07/26/22: C3 Maintenance: vincristine 2mg , Prednisone 200mg  D1-5, dasatinib 70 mg  08/08/22: IT #11 - prophylaxis completed  08/23/22: C4 Maintenance: vincristine 2mg , Prednisone 200mg  D1-5, dasatinib 70 mg  09/20/22: C5 Maintenance: vincristine 2mg , Prednisone 200mg  D1-5, dasatinib 70 mg  10/04/22: Bone marrow biopsy - continued remission, MRD and BCR/ABL p190 negative  10/18/22: C6 Maintenance. ANC 0.4, HOLD dasatinib, continue vincristine 2mg  and Prednisone 200mg  D1-5  11/15/22: C7 Maintenance ANC 0.4. HOLD TKI, continue vincristine 2mg  and Prednisone 200mg  D1-5  ~11/30/22: Start ponatinib 15 mg daily  12/13/22: C8 Maintenance ANC 1.2. ponatinib 15mg , vincristine 2mg  D1 and Prednisone 200mg  D1-5  01/10/23: C9 Maintenance Anc 0.7. HOLD ponatinib, continue vincristine 2mg  D1 and Prednisone 200mg  D1-5     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)   10/29/2021 Initial Diagnosis    B-cell acute lymphoblastic leukemia (ALL) (CMS-HCC)     10/30/2021 - 03/30/2022 Chemotherapy    IP/OP LEUKEMIA GRAAPH-2005 < 60 YO (OP PEGFILGRASTIM ON DAY 7)  [No description for this plan]     01/06/2022 - 04/07/2022 Endocrine/Hormone Therapy    OP LEUPROLIDE (LUPRON) 11.25 MG EVERY 3 MONTHS  Plan Provider: [name], MD     05/26/2022 -  Chemotherapy    OP LEUKEMIA VINCRISTINE  vinCRIStine 2 mg IV on day 1     07/26/2022 - 07/26/2022 Endocrine/Hormone Therapy    OP LEUPROLIDE (LUPRON) 11.25 MG EVERY 3 MONTHS  Plan Provider: [name], MD         Weight and Vitals:  Wt Readings from Last 3 Encounters:   02/07/23 85.5 kg (188 lb 7.9 oz)   01/16/23 84.4 kg (186 lb)   01/10/23 84.5 kg (186 lb 4.6 oz)     Temp Readings from Last 3 Encounters:   02/07/23 37.2 ??C (99 ??F) (Oral)   02/07/23 36.7 ??C (98 ??F) (Oral)   01/16/23 36.9 ??C (98.4 ??F) (Temporal)     BP Readings from Last 3 Encounters:   02/07/23 134/91   02/07/23 (S) 132/94   01/16/23 151/85     Pulse Readings from Last 3 Encounters:   02/07/23 75   02/07/23 89   01/16/23 70       Pertinent Labs:  Lab on 02/07/2023   Component Date Value Ref Range Status    Sodium 02/07/2023 143  135 - 145 mmol/L Final    Potassium 02/07/2023 3.8  3.4 - 4.8 mmol/L Final    Chloride 02/07/2023 110 (H)  98 - 107 mmol/L Final    CO2 02/07/2023 27.0  20.0 -  31.0 mmol/L Final    Anion Gap 02/07/2023 6  5 - 14 mmol/L Final    BUN 02/07/2023 23  9 - 23 mg/dL Final    Creatinine 16/05/9603 0.69  0.55 - 1.02 mg/dL Final    BUN/Creatinine Ratio 02/07/2023 33   Final    eGFR CKD-EPI (2021) Female 02/07/2023 >90  >=60 mL/min/1.67m2 Final    eGFR calculated with CKD-EPI 2021 equation in accordance with [employer] and [employer] of Nephrology Task Force recommendations.    Glucose 02/07/2023 147  70 - 179 mg/dL Final    Calcium 54/04/8118 9.6  8.7 - 10.4 mg/dL Final    Albumin 14/78/2956 3.9  3.4 - 5.0 g/dL Final    Total Protein 02/07/2023 7.1  5.7 - 8.2 g/dL Final    Total Bilirubin 02/07/2023 0.5  0.3 - 1.2 mg/dL Final    AST 21/30/8657 31  <=34 U/L Final    ALT 02/07/2023 27  10 - 49 U/L Final    Alkaline Phosphatase 02/07/2023 54  46 - 116 U/L Final    WBC 02/07/2023 3.2 (L)  3.6 - 11.2 10*9/L Final    RBC 02/07/2023 3.44 (L)  3.95 - 5.13 10*12/L Final    HGB 02/07/2023 12.3  11.3 - 14.9 g/dL Final    HCT 84/69/6295 34.4  34.0 - 44.0 % Final    MCV 02/07/2023 100.2 (H)  77.6 - 95.7 fL Final    MCH 02/07/2023 35.7 (H)  25.9 - 32.4 pg Final    MCHC 02/07/2023 35.7  32.0 - 36.0 g/dL Final    RDW 28/41/3244 15.3 (H)  12.2 - 15.2 % Final    MPV 02/07/2023 9.1  6.8 - 10.7 fL Final    Platelet 02/07/2023 146 (L)  150 - 450 10*9/L Final    Neutrophils % 02/07/2023 43.8  % Final    Lymphocytes % 02/07/2023 38.2  % Final    Monocytes % 02/07/2023 13.5  % Final    Eosinophils % 02/07/2023 3.3  % Final    Basophils % 02/07/2023 1.2  % Final    Absolute Neutrophils 02/07/2023 1.4 (L)  1.8 - 7.8 10*9/L Final    Absolute Lymphocytes 02/07/2023 1.2  1.1 - 3.6 10*9/L Final    Absolute Monocytes 02/07/2023 0.4  0.3 - 0.8 10*9/L Final    Absolute Eosinophils 02/07/2023 0.1  0.0 - 0.5 10*9/L Final    Absolute Basophils 02/07/2023 0.0  0.0 - 0.1 10*9/L Final    CMV Viral Ld 02/07/2023 Not Detected  Not Detected Final       Allergies:   Allergies   Allergen Reactions    Erythromycin Hives     Other reaction(s): Not available    Other      Pt cant take ibuprofen due to condition.    Sulfa (Sulfonamide Antibiotics) Anaphylaxis     Other reaction(s): Not available    Azithromycin        Drug Interactions: none identified; ponatinib is a major 3A4 substrate, caution with 3A4 inhibitors; letermovir is a mod 3A4 inhibitor (no adjustments necessary for ponatinib/VCR, monitor)      Current Medications:  Current Outpatient Medications   Medication Sig Dispense Refill    acetaminophen (TYLENOL 8 HOUR) 650 MG CR tablet Take 2 tablets (1,300 mg total) by mouth every eight (8) hours as needed for pain.      albuterol HFA 90 mcg/actuation inhaler Inhale 2 puffs every six (6) hours as needed for wheezing.  aspirin (ECOTRIN) 81 MG tablet Take 1 tablet (81 mg total) by mouth daily.      cetirizine (ZYRTEC) 10 MG tablet Take 1 tablet (10 mg total) by mouth nightly. 30 tablet 2    dapsone 100 MG tablet TAKE 1 TABLET(100 MG) BY MOUTH DAILY 30 tablet 11    letermovir (PREVYMIS) 480 mg tablet Take 1 tablet (480 mg total) by mouth daily. 28 tablet 2    norethindrone (MICRONOR) 0.35 mg tablet Take 1 tablet by mouth daily. 84 tablet 3 PONATinib (ICLUSIG) 15 mg tablet Take 1 tablet (15 mg total) by mouth daily. Swallow tablets whole. Do not crush, break, cut or chew tablets. 30 tablet 5    predniSONE (DELTASONE) 50 MG tablet Take 4 tablets (200 mg total) by mouth once daily on days 1-5 of each cycle. 20 tablet 5    rosuvastatin (CRESTOR) 10 MG tablet Take 1 tablet (10 mg total) by mouth nightly. 90 tablet 3    tizanidine (ZANAFLEX) 4 MG tablet Take 1 tablet (4 mg total) by mouth nightly. 90 tablet 2    triamcinolone (KENALOG) 0.1 % cream Apply topically two (2) times a day for 14 days. 45 g 0    valACYclovir (VALTREX) 500 MG tablet Take 1 tablet (500 mg total) by mouth daily. 30 tablet 12    levalbuterol (XOPENEX) 0.31 mg/3 mL nebulizer solution Inhale 3 mL (0.31 mg total) by nebulization every four (4) hours as needed for wheezing (cough). 45 mL 0    losartan (COZAAR) 50 MG tablet Take 1 tablet (50 mg total) by mouth daily. 30 tablet 3     No current facility-administered medications for this visit.       Adherence: No barriers identified/no missed doses.       Assessment: Ms.Havard is a 47 y.o. 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 Patient finished 5 cycles of hyperCVAD + rituximab + dasatinib before transitioning to VCR/dasatinib/prednisone maintenance . She was recently transitioned from dasatinib to ponatinib in April 2024, given difficulty with cytopenias on dasatinib (had been on dasatinib 70 mg daily and unable to titrate to normal maintenance dosing due to neutropenia). Previous cytopenias may have been CMV and/or valcyte related as well, and she was transitioned to letermovir by ICID to avoid valcyte-induced myelosuppression. CMV has recently remained undetectable and is pending from today. She was transitioned to ponatinib 15 mg daily with the thought that neutropenia may have been secondary to dasatinib (started at 15 mg dosing since BCR-ABL negative). Most recent BMBx (10/04/22) showed MRD negative disease. She is tolerating ponatinib well thus far, and her ANC remains stable this month. Discussed plan around tapering off lexapro. Will start BP medication, HTN likely secondary to ponatinib but has been on losartan before. Receiving IVIG to minimize infectious complications.     Plan:   1) Ph+ ALL  - Continue vincristine IV on day 1 of 28 day cycle (due today)  - Continue prednisone 200 mg (4 tablets) days 1-5 of 28 day cycle (will restart today)  -Continue ponatinib 15 mg daily; re-check labs in 2 weeks, moving forward plan will be to hold when ANC <0.5 and restart when ANC > 1.0  - Tend amylase/lipase, however elevations are asymptomatic at this time  - #12 of 12 IT therapy complete  -  RTC in 4 weeks with labs     2) Infection ppx   -If ANC < 0.5, add levofloxacin 500 mg daily and fluconazole 200  mg daily  -Continue dapsone 100 mg PO daily for PJP ppx  -Continue letermovir 480 mg daily per ICID (started 10/05/22)   -Continue valacyclovir 500 mg daily (will hold if needing to resume valganciclovir)   -Continue IVIG - dose due 7/16 and continue every 4 weeks (scheduled to be given at [facility])  -CMV level monthly    3) CV  - Continue aspirin 81 mg with ponatinib (hold if PLT ever < 50)  - Continue rosuvastatin 10 mg daily with ponatinib. Repeat fasting lipid panel after about 3 months of therapy  - START losartan 50 mg daily (previously on this med and dose, tolerated well), CTM BP at home    4) Supportive Care  - Continue Tylenol prn for pain. Check temperature before Tylenol dose and avoid taking NSAIDs.  - Can take Dulcolax prn for constipation  - Can take famotidine if having acid reflex from steroids.   - Most recent dose of Lupron on 07/26/22, which was final dose. Currently on daily Micronor. She can follow with gynecology for alternatives as needed. Will try water based lubricant but should also discuss with gyn her pain with sex  - Cut lexapro in half and take 5 mg daily for 1-2 weeks, then off. Can restart with our guidance if she feels like she'd benefit from this, or can consider alternative (wellbutrin, or SNRI) for weight neutral option.    F/u:  Future Appointments   Date Time Provider Department Center   02/14/2023  9:30 AM Ortencia Kick, MD ONCMULTI [facility]   02/14/2023 10:45 AM UNCTIF 19 UNCTHERINFET [facility]   03/01/2023 10:00 AM Bill Salinas, MD [id number] [facility]   03/07/2023  8:00 AM ADULT ONC LAB UNCCALAB [facility]   03/07/2023  9:00 AM Lenon Ahmadi, AGNP [id number] [facility]   03/07/2023 10:00 AM ONCHEM LEUKEMIA PHARMACIST [id number]   03/13/2023 11:15 AM UNCTIF 18 UNCTHERINFET [facility]     I spent 30 minutes in direct patient care    Manfred Arch, PharmD, BCOP, CPP  Pager: [phone]

## 2023-02-08 NOTE — Unmapped (Signed)
[facility] Refill Coordination Note    Specialty Medication(s) to be Shipped:   Hematology/Oncology: Iclusig    Other medication(s) to be shipped: No additional medications requested for fill at this time     Patricia [name], DOB: [birth year]  Phone: [phone] (home)       All above HIPAA information was verified with patient.     Was a Nurse, learning disability used for this call? No    Completed refill call assessment today to schedule patient's medication shipment from the [facility] [phone]).  All relevant notes have been reviewed.     Specialty medication(s) and dose(s) confirmed: Regimen is correct and unchanged.   Changes to medications: [name] reports no changes at this time.  Changes to insurance: No  New side effects reported not previously addressed with a pharmacist or physician: None reported  Questions for the pharmacist: No    Confirmed patient received a Conservation officer, historic buildings and a Surveyor, mining with first shipment. The patient will receive a drug information handout for each medication shipped and additional FDA Medication Guides as required.       DISEASE/MEDICATION-SPECIFIC INFORMATION        N/A    SPECIALTY MEDICATION ADHERENCE     Medication Adherence    Patient reported X missed doses in the last month: 0  Specialty Medication: ICLUSIG 15 mg  Patient is on additional specialty medications: No  Patient is on more than two specialty medications: No  Any gaps in refill history greater than 2 weeks in the last 3 months: no  Demonstrates understanding of importance of adherence: yes              Were doses missed due to medication being on hold? No    ICLUSIG 15 mg tablet (PONATinib) : 7  days of medicine on hand        REFERRAL TO PHARMACIST     Referral to the pharmacist: Not needed      [facility]     Shipping address confirmed in Epic.       Delivery Scheduled: Yes, Expected medication delivery date: 02/13/23.     Medication will be delivered via Same Day Courier to the prescription address in Epic WAM.    [name]   [facility] Specialty Technician

## 2023-02-08 NOTE — Unmapped (Signed)
Pt in chair 19 came in for her scheduled chemo infusion which was well tolerated.    Labs were reviewed, port was already accessed, flushes well, with + blood return pre and post infusion then deaccessed.    AVS was declined, went home ambulatory.

## 2023-02-10 NOTE — Unmapped (Signed)
Addended by: [name] T on: 02/10/2023 10:32 AM     Modules accepted: Orders

## 2023-02-13 MED FILL — ICLUSIG 15 MG TABLET: ORAL | 30 days supply | Qty: 30 | Fill #2

## 2023-02-14 ENCOUNTER — Ambulatory Visit: Admit: 2023-02-14 | Discharge: 2023-02-15 | Payer: PRIVATE HEALTH INSURANCE

## 2023-02-14 ENCOUNTER — Ambulatory Visit
Admit: 2023-02-14 | Discharge: 2023-02-15 | Payer: PRIVATE HEALTH INSURANCE | Attending: Student in an Organized Health Care Education/Training Program | Primary: Student in an Organized Health Care Education/Training Program

## 2023-02-14 MED ADMIN — immun glob G(IgG)-pro-IgA 0-50 (PRIVIGEN) 10 % intravenous solution 30 g: .4 g/kg | INTRAVENOUS | @ 15:00:00 | Stop: 2023-02-14 | NDC 44206043993

## 2023-02-14 MED ADMIN — dextrose 5 % infusion: 30 mL/h | INTRAVENOUS | @ 15:00:00 | Stop: 2023-02-14 | NDC 82468011674

## 2023-02-14 MED ADMIN — acetaminophen (TYLENOL) tablet 650 mg: 650 mg | ORAL | @ 15:00:00 | Stop: 2023-02-14 | NDC 50580048790

## 2023-02-14 MED ADMIN — heparin, porcine (PF) 100 unit/mL injection 500 Units: 500 [IU] | INTRAVENOUS | @ 17:00:00 | Stop: 2023-02-14 | NDC 00009029101

## 2023-02-14 MED ADMIN — diphenhydrAMINE (BENADRYL) capsule/tablet 25 mg: 25 mg | ORAL | @ 15:00:00 | Stop: 2023-02-14 | NDC 97807007030

## 2023-02-14 NOTE — Unmapped (Unsigned)
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 Now being treated, awaiting results of latest CMV test.  - Cont tylenol 650mg  TID PRN    #Acute Sinusitis: New, worsening. Likely bacterial based on time trajectory. Has Augmentin script already but going to confirm okay to use iwht oncology team later this morning  - Augmenting BID if approved by oncology    #Blurred Vision: Stable, but not resolved. Planning to get to eye doctor soon.     #Cancer-Related Pain: Slightly worse, but still mild. Locates to area of recent BMBx. Also with muscle aches intermittnetly. Responsive to tylenol and tizanidine.  - Cont tylenol PRN  - Cont tizandine PRN    #Anxiety/Coping Support: Improved. More at peace after deciding not to go back to school next year.  She put herself back on prior prescription of lexapro (started 9/8). Doing well on her current dose and reports no recent panic attacks. Still has a racing mind at night and occasional heart flutters.  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     #Advanced Care Planning  - She and her husband have an excellent understanding of the current course of her treatment.  She recognizes that she is responding well and there is some uncertainty about how many rounds of treatment she will have to go through.  Has heard that the goal of treatment is curative, and she remains very hopeful for this.  Has been able to tolerate treatment fairly well to this point. Her major concern is about side effects from lumbar punctures.  Current focus is on cancer treatment and survival while maintaining quality of life is much as possible.    Health Care Decision Maker as of 02/13/2023    HCDM (patient stated preference): Patricia [name] Spouse - [phone]      # Controlled substances risk management.   - Patient does not have a signed pain medication agreement with our team.  No controlled substances being prescribed by our team currently.    - [place] database was reviewed today and it was appropriate.   - Urine drug screen was not performed at this visit. Findings: not applicable.   - Patient has received information about safe storage and administration of medications.   - Patient has not received a prescription for narcan; is not applicable.       F/u: 8-10 weeks    ----------------------------------------  Referring Provider: From inpatient palliative care team  Oncology Team: Malignant hematology  PCP: [name], MD    Interval Hx, 02/14/23:  Since our last visit, no major events aside from planning to taper off lexapro due to weight gain concerns.     Today, she reports ***    Since last visit, was able to start ponatinib after having improvement in her ANC. BMBx recently showed deep remission.     Today, she reports that she is doing well overall. Has been dealing with sinus pressure for last two week that started worsening over the last 4 days. Suspects sinus infection  as she has had many in the past. Awaiting approval from oncology for augementin course.     On ROS, some tenderness near lumbar puncture site when dorsiflexing. Also with muscle tightness/soreness, especially after prologned sitting. Tizanidine has helped.     Doing well from mood perspecitive. Gardening more. 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 Non-profit that will focus on supporting [facility]. Wants to make sure families have additional support when caring for patients with prolonged hospital stays.    Still with eye blurriness. No other acute concerns. No n/v, constipation or diarrhea.       HPI: Ph+ ALL, currently in remission.    Current cancer-directed therapy: maintenance vincristine 2g, Prednisone 200mg  D1-4 +dasatinib       Palliative Performance Scale: 90% - Ambulation: Full / Normal Activity, some evidence of disease / Self-Care:Full / Intake: Normal / Level of Conscious: Full      Coping/Support Issues: She and her husband have multiple stressors but overall feels like she is coping fairly well right now.  She will reach out to our palliative care team if new concerns emerge.    Goals of Care:  Focus on cancer directed therapy and survival while hoping to maintain her quality of life is much as possible    Social History:   Name of primary support: Husband Patricia [name]  occupation: 6 grade teacher      Advance Care Planning:   HCPOA: Spouse  Natural surrogate decision maker: Husband Patricia [name]  Living Will: no  ACP note:     Objective       Hematology/Oncology History Overview Note   Referring/Local Oncologist:    Diagnosis:   10/29/2021  Bone marrow, left iliac, aspiration and biopsy  -  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 The majority of the abnormal cells (64/88) contained an additional BCR/ABL1 fusion signal, while 9/88 abnormal cells contained an additional ABL1 and ASS1 signal.       Genetics:   Karyotype/FISH:   RESULTS   Date Value Ref Range Status   10/28/2021   Final    NOTE: This report reflects a combined study from a peripheral blood and a bone marrow core biopsy. Eleven cells from the peripheral blood and nine cells from the bone marrow core biopsy were analyzed. The BCR/ABL1 FISH analysis was performed on the peripheral blood.     Abnormal Karyotype:  47,XX,t(9;22)(q34;q11.2),+der(22)t(9;22)[18]/46,XX[2]    Abnormal FISH:  A BCR/ABL1 interphase FISH assay showed a signal pattern consistent with a BCR::ABL1 rearrangement and the 9;22 translocation in 88% of the 100 cells scored. The majority of the abnormal cells (64/88) contained an additional BCR/ABL1 fusion signal, while 9/88 abnormal cells contained an additional ABL1 and ASS1 signal.           Pertinent Phenotypic data:  CD19 98%  CD20 on diagnosis 51%  CD22 98%      Treatment Timeline:  10/29/2021: Bone marrow biopsy: Ph+ ALL, 51% expression of CD20  10/30/21: Cycle 1 day 1 GRAAPH-2005 induction  11/03/21: ITT #1   11/12/2021:  IT #2  11/18/2021: IT #3  11/29/2021: Post cycle 1 bone marrow biopsy: Morphologic CR.  MRD by flow insufficient, p190 2/100,000  12/10/2021: Cycle 2 B cycle + rituximab  12/14/21: IT#4  12/17/21: IT#5  01/10/22: Cycle 3 A cycle + rituximab  01/11/22: IT #6  01/24/22: Bmbx - MRD-neg by PCR (p190)   02/17/22: C4 B cycle + ritux  02/18/22: IT#7  03/21/22: C5 A cycle + ritux (4th dose)  03/22/22: IT#8  04/29/22: Bmbx - MRD-neg by PCR (p190)   05/18/22: restart dasatinib 70mg  s/p neutropenia  05/26/22: Start maintenance vincristine 2mg , Prednisone 200mg  D1-5   06/02/22: IT #9  06/27/22: C2 Maintenance: vincristine 2mg , Prednisone 200mg  D1-5, HOLD dasatinib due to neutropenia  07/04/22: RESTART dasatinib 70mg   07/21/22: IT #10  07/26/22: C3 Maintenance: vincristine 2mg , Prednisone 200mg  D1-5, dasatinib 70 mg  08/08/22: IT #11 - prophylaxis completed  08/23/22: C4 Maintenance: vincristine 2mg , Prednisone 200mg  D1-5, dasatinib 70 mg  09/20/22: C5 Maintenance: vincristine 2mg , Prednisone 200mg  D1-5, dasatinib 70 mg  10/04/22: Bone marrow biopsy - continued remission, MRD and BCR/ABL p190 negative  10/18/22: C6 Maintenance. ANC 0.4, HOLD dasatinib, continue vincristine 2mg  and Prednisone 200mg  D1-5  11/15/22: C7 Maintenance ANC 0.4. HOLD TKI, continue vincristine 2mg  and Prednisone 200mg  D1-5  ~11/30/22: Start ponatinib 15 mg daily  12/13/22: C8 Maintenance ANC 1.2. ponatinib 15mg , vincristine 2mg  D1 and Prednisone 200mg  D1-5  01/10/23: C9 Maintenance Anc 0.7. HOLD ponatinib, continue vincristine 2mg  D1 and Prednisone 200mg  D1-5  01/26/23: ANC 1.7, restart ponatinib 15 mg  02/08/23: C10 Maintenance. 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)   10/29/2021 Initial Diagnosis    B-cell acute lymphoblastic leukemia (ALL) (CMS-HCC)     10/30/2021 - 03/30/2022 Chemotherapy    IP/OP LEUKEMIA GRAAPH-2005 < 61 YO (OP PEGFILGRASTIM ON DAY 7)  [No description for this plan]     01/06/2022 - 04/07/2022 Endocrine/Hormone Therapy    OP LEUPROLIDE (LUPRON) 11.25 MG EVERY 3 MONTHS  Plan Provider: [name], MD     05/26/2022 -  Chemotherapy    OP LEUKEMIA VINCRISTINE  vinCRIStine 2 mg IV on day 1     07/26/2022 - 07/26/2022 Endocrine/Hormone Therapy    OP LEUPROLIDE (LUPRON) 11.25 MG EVERY 3 MONTHS  Plan Provider: [name], MD           REVIEW OF SYSTEMS:  A comprehensive review of 10 systems was negative except for pertinent positives noted in HPI.      PHYSICAL EXAM:   There were no vitals filed for this visit.    GEN: Awake and alert, pleasant appearing female in no acute distress. Hair is growing back, much more than last visit  LUNGS: No increased work of breathing on room air.  PSYCH: Alert and oriented to person, place and time. Excited when talking about her projects.  SKIN: No rashes, petechiae or jaundice noted    I personally spent 35 minutes face-to-face and non-face-to-face in the care of this patient, which includes all pre, intra, and post visit time on the date of service.  All documented time was specific to the E/M visit and does not include any procedures that may have been performed.     Redgie Grayer, MD  [facility]

## 2023-02-14 NOTE — Unmapped (Signed)
Pt presents for IVIG.  VSS, pt weight is 84 kg. Right chest port lateral accessed as per protocol with brisk return of blood, patient tolerated well without complications, premeds administered.  Pt aware of potential reaction/side effects, call bell within reach.    1043 IVIG 30 g started, to infuse at the following rates:    25 ml/hr for 15 min  84 ml/hr for 30 min  168 ml/hr for 30 min  403 ml/hr until complete.  1229 IVIG complete.  Pt tolerated without complication, VSS.  IV flushed per policy and d/c'd, gauze and coban applied.  Pt left clinic in no acute distress.

## 2023-02-20 NOTE — Unmapped (Addendum)
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    Assessment/Plan:   #Anxiety/Coping Support: Improved. More at peace after deciding not to go back to school next year.  Briefly stopped lexapro but then restarted when noticed worsening ability to cope and more frequent emotional breakdowns. Since restarting she is doing well on her current dose and reports no recent panic attacks. Still has a racing mind at night and occasional heart flutters. Is planning to re-establish with prior therapist. Also notes she has components of ADHD but feels Lexapro+Losartan treats that well. Does not some undesired sexual side effects that she is considering discussing with gyn alternative management approaches.   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 Now being treated for CMV viremia as well.  - Cont tylenol 650mg  TID PRN  - Cont letermovir    #Hemorrhoids: Worsening in setting of chemo therapy. Limited by definitive management given fluctuating ANC  - Continue OTC topical treatments for now  - Defer to onc regarding if/when she would be a candidate for definitive surgical management if they persist    #Blurred Vision: Stable, but not resolved. Planning to get to eye doctor soon.     #Cancer-Related Pain: Slightly worse, but still mild. Locates to area of recent BMBx. Also with muscle aches intermittnetly. Responsive to tylenol and tizanidine which she takes very intermittently.   - Cont tylenol PRN  - Cont tizandine PRN    #Advanced Care Planning  - She and her husband have an excellent understanding of the current course of her treatment.  She recognizes that she is responding well and there is some uncertainty about how many rounds of treatment she will have to go through.  Has heard that the goal of treatment is curative, and she remains very hopeful for this.  Has been able to tolerate treatment fairly well to this point. Her major concern is about side effects from lumbar punctures.  Current focus is on cancer treatment and survival while maintaining quality of life is much as possible.    Health Care Decision Maker as of 02/21/2023    HCDM (patient stated preference): Patricia [name] Spouse - [phone]      # Controlled substances risk management.   - Patient does not have a signed pain medication agreement with our team.  No controlled substances being prescribed by our team currently.    - [place]CCSRS database was reviewed today and it was appropriate.   - Urine drug screen was not performed at this visit. Findings: not applicable.   - Patient has received information about safe storage and administration of medications.   - Patient has not received a prescription for narcan; is not applicable.       F/u: 10-12 weeks    ----------------------------------------  Referring Provider: From inpatient palliative care team  Oncology Team: Malignant hematology  PCP: [name], MD    Interval Hx, 02/21/23:  Since our last visit, no major events aside from planning to taper off lexapro due to weight gain and feeling  too even keeled. 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 Also wonders if lexapro is causing some sexual side effects, both with decreased drive as well as pain.     Otherwise, today, she reports hair is long enough to be in ponytails. Trying to stay calm and focus on it being okay. Re-establishing with therapist care. Feels easily cranky. Wonders if going to menopause, having hot flashes, needs to make visit with gyn to check her hormone levels. 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. Logo is Music therapist with moonphases around it, in [employer]ore dragonfly is either fairies in disguise or how fairies got around. Dragonflies are able to adapt and change as are moon cycles. Going to crafting shows is exhausting especially in heat, husband helps with set up and take down. Has constant fear of cancer coming back even though she knows she is in remission.Is also writing a book, all about listening to your body and self advocating. Does think she has ADHD, is working with her husband to set goals, one big thing per day. Thinks the lexapro and losartan combination helps with her ADHD.     Brother dies unexpectedly in 03/05/2019 from bee sting (was ultimately unable to get life saving transplant because history drug/alcohol use), then had cousin later that summer die of overdose. Through school was able to start seeing therapist.      As far as symptoms notes that every time she comes off prednisone has different symptoms. This past time noted neuropathy is worst, usually three fingers on both hands, this time is more including pinky and ring finger, right now is actually better. Not painful, just decreased sensation. Thinks less swollen and numb now but worst in morning. More of annoyance     Has had issue with hemorrhoids since second round of chemo, one is bothering her more, the eternal hemorrhoids from hell. Does get inflamed some.Monday of last week got four additional ones. Needs ANC to even out so she can get definitive intervention. No constipation, notes she is actually more regular know with her BMS than she has ever been.     Denies nausea, decreased appetite.      HPI: Ph+ ALL, currently in remission.    Current cancer-directed therapy: maintenance vincristine 2g, Prednisone 200mg  D1-4 +dasatinib       Palliative Performance Scale: 90% - Ambulation: Full / Normal Activity, some evidence of disease / Self-Care:Full / Intake: Normal / Level of Conscious: Full      Coping/Support Issues: She and her husband have multiple stressors but overall feels like she is coping fairly well right now.  She will reach out to our palliative care team if new concerns emerge.    Goals of Care:  Focus on cancer directed therapy and survival while hoping to maintain her quality of life is much as possible    Social History:   Name of primary support: Husband Patricia [name]  occupation: 6 grade teacher      Advance Care Planning:   HCPOA: Spouse  Natural surrogate decision maker: Husband Patricia [name]  Living Will: no  ACP note:     Objective       Hematology/Oncology History Overview Note   Referring/Local Oncologist:    Diagnosis:   10/29/2021  Bone marrow, left iliac, aspiration and biopsy  -  Hypercellular bone marrow (greater than 90%) involved  by B lymphoblastic leukemia (~95%blasts by morphologic assessment of aspirate smears and touch preps)  - Abnormal Karyotype:  47,XX,t(9;22)(q34;q11.2),+der(22)t(9;22)[18]/46,XX[2]     Abnormal FISH:  A BCR/ABL1 interphase FISH assay showed a signal pattern consistent with a BCR::ABL1 rearrangement and the 9;22 translocation in 88% of the 100 cells scored. The majority of the abnormal cells (64/88) contained an additional BCR/ABL1 fusion signal, while 9/88 abnormal cells contained an additional ABL1 and ASS1 signal.       Genetics:   Karyotype/FISH:   RESULTS   Date Value Ref Range Status   10/28/2021   Final    NOTE: This report reflects a combined study from a peripheral blood and a bone marrow core biopsy. Eleven cells from the peripheral blood and nine cells from the bone marrow core biopsy were analyzed. The BCR/ABL1 FISH analysis was performed on the peripheral blood.     Abnormal Karyotype:  47,XX,t(9;22)(q34;q11.2),+der(22)t(9;22)[18]/46,XX[2]    Abnormal FISH:  A BCR/ABL1 interphase FISH assay showed a signal pattern consistent with a BCR::ABL1 rearrangement and the 9;22 translocation in 88% of the 100 cells scored. The majority of the abnormal cells (64/88) contained an additional BCR/ABL1 fusion signal, while 9/88 abnormal cells contained an additional ABL1 and ASS1 signal.           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  MRD by flow insufficient, p190 2/100,000  12/10/2021: Cycle 2 B cycle + rituximab  12/14/21: IT#4  12/17/21: IT#5  01/10/22: Cycle 3 A cycle + rituximab  01/11/22: IT #6  01/24/22: Bmbx - MRD-neg by PCR (p190)   02/17/22: C4 B cycle + ritux  02/18/22: IT#7  03/21/22: C5 A cycle + ritux (4th dose)  03/22/22: IT#8  04/29/22: Bmbx - MRD-neg by PCR (p190)   05/18/22: restart dasatinib 70mg  s/p neutropenia  05/26/22: Start maintenance vincristine 2mg , Prednisone 200mg  D1-5   06/02/22: IT #9  06/27/22: C2 Maintenance: vincristine 2mg , Prednisone 200mg  D1-5, HOLD dasatinib due to neutropenia  07/04/22: RESTART dasatinib 70mg   07/21/22: IT #10  07/26/22: C3 Maintenance: vincristine 2mg , Prednisone 200mg  D1-5, dasatinib 70 mg  08/08/22: IT #11 - prophylaxis completed  08/23/22: C4 Maintenance: vincristine 2mg , Prednisone 200mg  D1-5, dasatinib 70 mg  09/20/22: C5 Maintenance: vincristine 2mg , Prednisone 200mg  D1-5, dasatinib 70 mg  10/04/22: Bone marrow biopsy - continued remission, MRD and BCR/ABL p190 negative  10/18/22: C6 Maintenance. ANC 0.4, HOLD dasatinib, continue vincristine 2mg  and Prednisone 200mg  D1-5  11/15/22: C7 Maintenance ANC 0.4. HOLD TKI, continue vincristine 2mg  and Prednisone 200mg  D1-5  ~11/30/22: Start ponatinib 15 mg daily  12/13/22: C8 Maintenance ANC 1.2. ponatinib 15mg , vincristine 2mg  D1 and Prednisone 200mg  D1-5  01/10/23: C9 Maintenance Anc 0.7. HOLD ponatinib, continue vincristine 2mg  D1 and Prednisone 200mg  D1-5  01/26/23: ANC 1.7, restart ponatinib 15 mg  02/08/23: C10 Maintenance. 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)   10/29/2021 Initial Diagnosis    B-cell acute lymphoblastic leukemia (ALL) (CMS-HCC)     10/30/2021 - 03/30/2022 Chemotherapy    IP/OP LEUKEMIA GRAAPH-2005 < 61 YO (OP PEGFILGRASTIM ON DAY 7)  [No description for this plan]  01/06/2022 - 04/07/2022 Endocrine/Hormone Therapy    OP LEUPROLIDE (LUPRON) 11.25 MG EVERY 3 MONTHS  Plan Provider: [name], MD     05/26/2022 -  Chemotherapy    OP LEUKEMIA VINCRISTINE  vinCRIStine 2 mg IV on day 1     07/26/2022 - 07/26/2022 Endocrine/Hormone Therapy    OP LEUPROLIDE (LUPRON) 11.25 MG EVERY 3 MONTHS  Plan Provider: [name], MD           REVIEW OF SYSTEMS:  A comprehensive review of 10 systems was negative except for pertinent positives noted in HPI.      PHYSICAL EXAM:   There were no vitals filed for this visit.    GEN: Awake and alert, pleasant appearing female in no acute distress. Hair is growing back and in pig tails  LUNGS: No increased work of breathing on room air, talking in full sentences.  PSYCH: Alert and oriented to person, place and time. Excited when talking about her projects.  SKIN: No rashes, petechiae or jaundice noted        The patient reports they are physically located in [place] and is currently: at home. I conducted a audio/video visit. I spent  22m 40s on the video call with the patient. I spent an additional 15 minutes on pre- and post-visit activities on the date of service .     De Blanch, MD  [facility] and Palliative Medicine Fellow      This was a telehealth service and I was in person with the resident.  As the attending physician, I spent 35 minutes in medical discussion with the patient via real-time audio and video, participating in the key portions of the service.  I spent an additional 15 minutes on pre- and post-visit activities which were specific to the patient and included reviewing the patient???s medical records, lab results, imaging results, and other pertinent records. I reviewed the resident's note and I agree with the resident's findings and plan.

## 2023-02-21 ENCOUNTER — Telehealth
Admit: 2023-02-21 | Discharge: 2023-02-22 | Payer: PRIVATE HEALTH INSURANCE | Attending: Student in an Organized Health Care Education/Training Program | Primary: Student in an Organized Health Care Education/Training Program

## 2023-02-21 DIAGNOSIS — G893 Neoplasm related pain (acute) (chronic): Principal | ICD-10-CM

## 2023-02-21 DIAGNOSIS — K5903 Drug induced constipation: Principal | ICD-10-CM

## 2023-02-21 DIAGNOSIS — F419 Anxiety disorder, unspecified: Principal | ICD-10-CM

## 2023-02-21 DIAGNOSIS — C9100 Acute lymphoblastic leukemia not having achieved remission: Principal | ICD-10-CM

## 2023-02-23 ENCOUNTER — Institutional Professional Consult (permissible substitution): Admit: 2023-02-23 | Discharge: 2023-02-24 | Payer: PRIVATE HEALTH INSURANCE

## 2023-02-23 DIAGNOSIS — C9100 Acute lymphoblastic leukemia not having achieved remission: Principal | ICD-10-CM

## 2023-02-23 LAB — CBC W/ AUTO DIFF
BASOPHILS ABSOLUTE COUNT: 0.1 10*9/L (ref 0.0–0.1)
BASOPHILS RELATIVE PERCENT: 1.3 %
EOSINOPHILS ABSOLUTE COUNT: 0 10*9/L (ref 0.0–0.5)
EOSINOPHILS RELATIVE PERCENT: 0.7 %
HEMATOCRIT: 32.6 % — ABNORMAL LOW (ref 34.0–44.0)
HEMOGLOBIN: 11.3 g/dL (ref 11.3–14.9)
LYMPHOCYTES ABSOLUTE COUNT: 1.8 10*9/L (ref 1.1–3.6)
LYMPHOCYTES RELATIVE PERCENT: 39 %
MEAN CORPUSCULAR HEMOGLOBIN CONC: 34.6 g/dL (ref 32.0–36.0)
MEAN CORPUSCULAR HEMOGLOBIN: 34.8 pg — ABNORMAL HIGH (ref 25.9–32.4)
MEAN CORPUSCULAR VOLUME: 100.7 fL — ABNORMAL HIGH (ref 77.6–95.7)
MEAN PLATELET VOLUME: 9.3 fL (ref 6.8–10.7)
MONOCYTES ABSOLUTE COUNT: 1.4 10*9/L — ABNORMAL HIGH (ref 0.3–0.8)
MONOCYTES RELATIVE PERCENT: 30.1 %
NEUTROPHILS ABSOLUTE COUNT: 1.3 10*9/L — ABNORMAL LOW (ref 1.8–7.8)
NEUTROPHILS RELATIVE PERCENT: 28.9 %
NUCLEATED RED BLOOD CELLS: 0 /100{WBCs} (ref <=4)
PLATELET COUNT: 129 10*9/L — ABNORMAL LOW (ref 150–450)
RED BLOOD CELL COUNT: 3.24 10*12/L — ABNORMAL LOW (ref 3.95–5.13)
RED CELL DISTRIBUTION WIDTH: 15.7 % — ABNORMAL HIGH (ref 12.2–15.2)
WBC ADJUSTED: 4.6 10*9/L (ref 3.6–11.2)

## 2023-02-23 LAB — COMPREHENSIVE METABOLIC PANEL
ALBUMIN: 4.1 g/dL (ref 3.4–5.0)
ALKALINE PHOSPHATASE: 54 U/L (ref 46–116)
ALT (SGPT): 52 U/L — ABNORMAL HIGH (ref 10–49)
ANION GAP: 5 mmol/L (ref 5–14)
AST (SGOT): 50 U/L — ABNORMAL HIGH (ref <=34)
BILIRUBIN TOTAL: 0.6 mg/dL (ref 0.3–1.2)
BLOOD UREA NITROGEN: 14 mg/dL (ref 9–23)
BUN / CREAT RATIO: 23
CALCIUM: 9.4 mg/dL (ref 8.7–10.4)
CHLORIDE: 110 mmol/L — ABNORMAL HIGH (ref 98–107)
CO2: 25.1 mmol/L (ref 20.0–31.0)
CREATININE: 0.6 mg/dL
EGFR CKD-EPI (2021) FEMALE: >90 mL/min/{1.73_m2} (ref >=60)
GLUCOSE RANDOM: 144 mg/dL (ref 70–179)
POTASSIUM: 4.1 mmol/L (ref 3.4–4.8)
PROTEIN TOTAL: 7.4 g/dL (ref 5.7–8.2)
SODIUM: 140 mmol/L (ref 135–145)

## 2023-02-23 MED ADMIN — heparin, porcine (PF) 100 unit/mL injection 500 Units: 500 [IU] | INTRAVENOUS | @ 18:00:00 | Stop: 2023-02-23 | NDC 00009029101

## 2023-02-28 NOTE — Unmapped (Signed)
IMMUNOCOMPROMISED HOST INFECTIOUS DISEASE CONSULT NOTE    Patricia [name] is being seen in consultation at the request of [name] for evaluation of CMV viremia.    Assessment/Recommendations:    Patricia [name] is a 47 y.o. 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 Her CMV viremia has remained low level however she has demonstrated resolution of fevers with treatment of CMV. Now that her CMV VL has been undetected for many months, we can stop monitoring.     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  -S/p 5 cycles GRAAPH-2005 + Dasatinib (treatment includes Rituximab)  -In MRD- CR1  -Receiving consolidation w Vincristine, pred, dasatinib-->ponatinib    #Hemorrhoids + anal fissure 05/05/22  -resolved    Active Infections  #Fever of unknown origin 05/11/22, likely 2/2 CMV viremia without disease  -10/11 Normal CT A/P, CT chest with mild diffuse bronchial wall thickening  -10/11 CT sinus with mild mucosal thickening   -Bld cx neg, urine culture w 10-50K E.faecalis, not treated  -CMV viremia as below in micro    Rx: 12/5-12/26 Valcyte 900mg  PO BID-->12/26-1/24/24 Valcyte 450mg  PO BID-->09/14/22 Valcyte 900mg  PO BID-->10/05/22 start letermovir (and valtrex)    Antimicrobial allergies/intolerances: Erythromycin, Azithro, Sulfa       RECOMMENDATIONS    Diagnostic  Ok to stop checking CMV viral load    Treatment  Continue letermovir and valtrex through the end of chemotherapy    Monitoring for antimicrobial toxicities  Patricia [name] is currently receiving drug therapy requiring intensive lab monitoring for toxicity.  As needed    Prophylaxis  Dapsone    Follow up in 9 months          Recommendations were communicated via shared medical record.    History of Present Illness:      Source of information includes:  Electronic Medical Records and Discussion with patient      Since last clinic visit, she has been maintained on letermovir + valtrex and is tolerating it well. Her CMV viral loads are undetectable and she no longer has had fevers, with just one episode of elevated temp to 99.1. She no longer has neutropenia after having been switched to ponatinib. She reports sweats occasionally which are usually associated with activity but sometimes come on out of the blue and make her feel like she has started menopause. On ROS she denies n/v/diarrhea, fevers, cough but does say that she has a small subcentimeter spot on her R leg that does not heal at all and starts bleeding when she shaves her legs. It is without any pain and has been there for many months (at least >8).       Allergies:  Allergies   Allergen Reactions    Erythromycin Hives     Other reaction(s): Not available    Other      Pt cant take ibuprofen due to condition.    Sulfa (Sulfonamide Antibiotics) Anaphylaxis     Other reaction(s): Not available    Azithromycin        Medications:     Current Outpatient Medications:     acetaminophen (TYLENOL 8 HOUR) 650 MG CR tablet, Take 2 tablets (1,300 mg total) by mouth every eight (8) hours as needed for pain., Disp: , Rfl:     albuterol HFA 90 mcg/actuation inhaler, Inhale 2 puffs every six (6) hours as needed for wheezing., Disp: , Rfl:     aspirin (ECOTRIN) 81 MG tablet, Take 1  tablet (81 mg total) by mouth daily., Disp: , Rfl:     cetirizine (ZYRTEC) 10 MG tablet, Take 1 tablet (10 mg total) by mouth nightly., Disp: 30 tablet, Rfl: 2    dapsone 100 MG tablet, TAKE 1 TABLET(100 MG) BY MOUTH DAILY, Disp: 30 tablet, Rfl: 11    letermovir (PREVYMIS) 480 mg tablet, Take 1 tablet (480 mg total) by mouth daily., Disp: 28 tablet, Rfl: 2    levalbuterol (XOPENEX) 0.31 mg/3 mL nebulizer solution, Inhale 3 mL (0.31 mg total) by nebulization every four (4) hours as needed for wheezing (cough)., Disp: 45 mL, Rfl: 0    losartan (COZAAR) 50 MG tablet, Take 1 tablet (50 mg total) by mouth daily., Disp: 30 tablet, Rfl: 3    norethindrone (MICRONOR) 0.35 mg tablet, Take 1 tablet by mouth daily., Disp: 84 tablet, Rfl: 3    PONATinib (ICLUSIG) 15 mg tablet, Take 1 tablet (15 mg total) by mouth daily. Swallow tablets whole. Do not crush, break, cut or chew tablets., Disp: 30 tablet, Rfl: 5    predniSONE (DELTASONE) 50 MG tablet, Take 4 tablets (200 mg total) by mouth once daily on days 1-5 of each cycle., Disp: 20 tablet, Rfl: 5    rosuvastatin (CRESTOR) 10 MG tablet, Take 1 tablet (10 mg total) by mouth nightly., Disp: 90 tablet, Rfl: 3    tizanidine (ZANAFLEX) 4 MG tablet, Take 1 tablet (4 mg total) by mouth nightly., Disp: 90 tablet, Rfl: 2    triamcinolone (KENALOG) 0.1 % cream, Apply topically two (2) times a day for 14 days., Disp: 45 g, Rfl: 0    valACYclovir (VALTREX) 500 MG tablet, Take 1 tablet (500 mg total) by mouth daily., Disp: 30 tablet, Rfl: 12    Current antibiotics:  Letermovir   Valtrex  Dapsone ppx    Previous antibiotics:  Levo, Cefepime  Flucon while neutropenic  Valcyte     Current/Prior immunomodulators:  Vincristine, pred    Other medications reviewed.       Review of Systems:  All other systems reviewed are negative.     Objective     Vital Signs:  There were no vitals taken for this visit.    Physical Exam:     Const [x]  vital signs above    [x]  NAD, non-toxic appearance []  Chronically ill-appearing, non-distressed        Eyes [x]  Lids normal bilaterally, conjunctiva anicteric and noninjected OU     [] PERRL  [] EOMI        ENMT [x]  Normal appearance of external nose and ears, no nasal discharge        []  MMM, no lesions on lips or gums []  No thrush, leukoplakia, oral lesions  []  Dentition good []  Edentulous []  Dental caries present  []  Hearing normal  []  TMs with good light reflexes bilaterally         Neck [x]  Neck of normal appearance and trachea midline        []  No thyromegaly, nodules, or tenderness   []  Full neck ROM        Lymph []  No LAD in neck     []  No LAD in supraclavicular area     []  No LAD in axillae   []  No LAD in epitrochlear chains     []  No LAD in inguinal areas        CV []  RRR []  No peripheral edema     []  Pedal pulses intact   []  No abnormal  heart sounds appreciated   []  Extremities WWP         Resp [x]  Normal WOB at rest    [x]  No breathlessness with speaking, no coughing  []  CTA anteriorly    []  CTA posteriorly          GI []  Normal inspection, NTND   []  NABS     []  No umbilical hernia on exam       []  No hepatosplenomegaly     []  Inspection of perineal and perianal areas normal        GU []  Normal external genitalia     [] No urinary catheter present in urethra   []  No CVA tenderness    []  No tenderness over renal allograft        MSK []  No clubbing or cyanosis of hands       []  No vertebral point tenderness  []  No focal tenderness or abnormalities on palpation of joints in RUE, LUE, RLE, or LLE        Skin []  No rashes, lesions, or ulcers of visualized skin     []  Skin warm and dry to palpation   R leg with subcentimeter erythematous lesion on R calf, unclear viewing on video      Neuro [x]  Face expression symmetric  []  Sensation to light touch grossly intact throughout    []  Moves extremities equally    [x]  No tremor noted        []  CNs II-XII grossly intact     []  DTRs normal and symmetric throughout []  Gait unremarkable        Psych [x]  Appropriate affect       [x]  Fluent speech         [x]  Attentive, good eye contact  []  Oriented to person, place, time          [x]  Judgment and insight are appropriate             Labs:  Results in Past 30 Days  Result Component Current Result Ref Range Previous Result Ref Range   Absolute Eosinophils 0.0 (02/23/2023) 0.0 - 0.5 10*9/L 0.1 (02/07/2023) 0.0 - 0.5 10*9/L   Absolute Lymphocytes 1.8 (02/23/2023) 1.1 - 3.6 10*9/L 1.2 (02/07/2023) 1.1 - 3.6 10*9/L   Absolute Neutrophils 1.3 (L) (02/23/2023) 1.8 - 7.8 10*9/L 1.4 (L) (02/07/2023) 1.8 - 7.8 10*9/L   Alkaline Phosphatase 54 (02/23/2023) 46 - 116 U/L 54 (02/07/2023) 46 - 116 U/L   ALT 52 (H) (02/23/2023) 10 - 49 U/L 27 (02/07/2023) 10 - 49 U/L   AST 50 (H) (02/23/2023) <=34 U/L 31 (02/07/2023) <=34 U/L BUN 14 (02/23/2023) 9 - 23 mg/dL 23 (08/06/1094) 9 - 23 mg/dL   Calcium 9.4 (0/45/4098) 8.7 - 10.4 mg/dL 9.6 (08/01/9145) 8.7 - 82.9 mg/dL   Creatinine 5.62 (08/31/8655) 0.55 - 1.02 mg/dL 8.46 (04/07/2951) 8.41 - 1.02 mg/dL   HGB 32.4 (10/31/270) 11.3 - 14.9 g/dL 53.6 (01/02/4033) 74.2 - 14.9 g/dL   Platelet 595 (L) (6/38/7564) 150 - 450 10*9/L 146 (L) (02/07/2023) 150 - 450 10*9/L   Potassium 4.1 (02/23/2023) 3.4 - 4.8 mmol/L 3.8 (02/07/2023) 3.4 - 4.8 mmol/L   Total Bilirubin 0.6 (02/23/2023) 0.3 - 1.2 mg/dL 0.5 (10/01/2949) 0.3 - 1.2 mg/dL   WBC 4.6 (8/84/1660) 3.6 - 11.2 10*9/L 3.2 (L) (02/07/2023) 3.6 - 11.2 10*9/L     Labs reviewed: ANC 1300, AST slightly elevated but stable, creatinine normal 0.60 stable, ALC 1.8 higher than before    Microbiology:  CMV   05/13/22 318  05/18/22 ND  05/26/22 ND  06/02/22 670  06/06/22 223  06/14/22 156  06/22/22 230  06/27/22 492  07/04/22 294   12/5-12/26 Valcyte 900mg  PO BID  07/11/22 113  07/18/22 75  07/26/22 ND   12/26-1/24/24 Valcyte 450mg  PO BID  08/04/22 ND  08/08/22 <35  08/18/22 ND  08/23/22 ND  1//29/24 ND  09/05/22 59  09/12/22 408   09/14/22 Valcyte 900mg  PO BID  09/20/22 <35  09/26/22 <35  10/04/22 <35    10/10/22 147  3/19-->present ND    Imaging:  None new    Serologies:  Lab Results   Component Value Date    Hep B Surface Ag Nonreactive 05/18/2022    Hep B S Ab Reactive (A) 05/18/2022    Hep B Surf Ab Quant 101.02 (H) 05/18/2022    Hep B Core Total Ab Nonreactive 05/18/2022    Hepatitis C Ab Nonreactive 10/28/2021       Immunizations:  Immunization History   Administered Date(s) Administered    COVID-19 VACC,MRNA,(PFIZER)(PF) 02/20/2020, 03/12/2020    TdaP 02/08/2017, 10/21/2021       Time spent on counseling/coordination of care: 15 Minutes  Total time spent with patient: 16 Minutes      The patient reports they are physically located in [place] and is currently: at home. I conducted a audio/video visit. I spent  10m 09s on the video call with the patient. I spent an additional 3 minutes on pre- and post-visit activities on the date of service .

## 2023-03-01 ENCOUNTER — Telehealth
Admit: 2023-03-01 | Discharge: 2023-03-02 | Payer: PRIVATE HEALTH INSURANCE | Attending: Infectious Disease | Primary: Infectious Disease

## 2023-03-01 DIAGNOSIS — C9100 Acute lymphoblastic leukemia not having achieved remission: Principal | ICD-10-CM

## 2023-03-01 DIAGNOSIS — B259 Cytomegaloviral disease, unspecified: Principal | ICD-10-CM

## 2023-03-06 DIAGNOSIS — C9100 Acute lymphoblastic leukemia not having achieved remission: Principal | ICD-10-CM

## 2023-03-07 ENCOUNTER — Ambulatory Visit: Admit: 2023-03-07 | Discharge: 2023-03-07 | Payer: PRIVATE HEALTH INSURANCE

## 2023-03-07 ENCOUNTER — Other Ambulatory Visit: Admit: 2023-03-07 | Discharge: 2023-03-07 | Payer: PRIVATE HEALTH INSURANCE

## 2023-03-07 ENCOUNTER — Ambulatory Visit
Admit: 2023-03-07 | Discharge: 2023-03-07 | Payer: PRIVATE HEALTH INSURANCE | Attending: Adult Health | Primary: Adult Health

## 2023-03-07 DIAGNOSIS — C9100 Acute lymphoblastic leukemia not having achieved remission: Principal | ICD-10-CM

## 2023-03-07 DIAGNOSIS — B259 Cytomegaloviral disease, unspecified: Principal | ICD-10-CM

## 2023-03-07 LAB — COMPREHENSIVE METABOLIC PANEL
ALBUMIN: 4.3 g/dL (ref 3.4–5.0)
ALKALINE PHOSPHATASE: 60 U/L (ref 46–116)
ALT (SGPT): 40 U/L (ref 10–49)
ANION GAP: 6 mmol/L (ref 5–14)
AST (SGOT): 42 U/L — ABNORMAL HIGH (ref <=34)
BILIRUBIN TOTAL: 0.6 mg/dL (ref 0.3–1.2)
BLOOD UREA NITROGEN: 16 mg/dL (ref 9–23)
BUN / CREAT RATIO: 23
CALCIUM: 9.3 mg/dL (ref 8.7–10.4)
CHLORIDE: 111 mmol/L — ABNORMAL HIGH (ref 98–107)
CO2: 26 mmol/L (ref 20.0–31.0)
CREATININE: 0.7 mg/dL
EGFR CKD-EPI (2021) FEMALE: >90 mL/min/{1.73_m2} (ref >=60)
GLUCOSE RANDOM: 136 mg/dL (ref 70–179)
POTASSIUM: 3.7 mmol/L (ref 3.4–4.8)
PROTEIN TOTAL: 7.1 g/dL (ref 5.7–8.2)
SODIUM: 143 mmol/L (ref 135–145)

## 2023-03-07 LAB — CBC W/ AUTO DIFF
HEMATOCRIT: 33 % — ABNORMAL LOW (ref 34.0–44.0)
HEMOGLOBIN: 11.5 g/dL (ref 11.3–14.9)
MEAN CORPUSCULAR HEMOGLOBIN CONC: 34.9 g/dL (ref 32.0–36.0)
MEAN CORPUSCULAR HEMOGLOBIN: 34.8 pg — ABNORMAL HIGH (ref 25.9–32.4)
MEAN CORPUSCULAR VOLUME: 99.6 fL — ABNORMAL HIGH (ref 77.6–95.7)
MEAN PLATELET VOLUME: 9.1 fL (ref 6.8–10.7)
PLATELET COUNT: 162 10*9/L (ref 150–450)
RED BLOOD CELL COUNT: 3.31 10*12/L — ABNORMAL LOW (ref 3.95–5.13)
RED CELL DISTRIBUTION WIDTH: 16.1 % — ABNORMAL HIGH (ref 12.2–15.2)
WBC ADJUSTED: 3.6 10*9/L (ref 3.6–11.2)

## 2023-03-07 LAB — LIPASE: LIPASE: 69 U/L — ABNORMAL HIGH (ref 12–53)

## 2023-03-07 LAB — MANUAL DIFFERENTIAL
BASOPHILS - ABS (DIFF): 0 10*9/L (ref 0.0–0.1)
BASOPHILS - REL (DIFF): 0 %
EOSINOPHILS - ABS (DIFF): 0.1 10*9/L (ref 0.0–0.5)
EOSINOPHILS - REL (DIFF): 2 %
LYMPHOCYTES - ABS (DIFF): 2.2 10*9/L (ref 1.1–3.6)
LYMPHOCYTES - REL (DIFF): 62 %
MONOCYTES - ABS (DIFF): 0.2 10*9/L — ABNORMAL LOW (ref 0.3–0.8)
MONOCYTES - REL (DIFF): 5 %
NEUTROPHILS - ABS (DIFF): 1.1 10*9/L — ABNORMAL LOW (ref 1.8–7.8)
NEUTROPHILS - REL (DIFF): 31 %

## 2023-03-07 LAB — AMYLASE: AMYLASE: 125 U/L — ABNORMAL HIGH (ref 30–118)

## 2023-03-07 MED ORDER — LETERMOVIR 480 MG TABLET
ORAL_TABLET | Freq: Every day | ORAL | 5 refills | 28 days | Status: CP
Start: 2023-03-07 — End: ?
  Filled 2023-03-07: qty 28, 28d supply, fill #0

## 2023-03-07 MED ADMIN — vinCRIStine (ONCOVIN) 2 mg in sodium chloride (NS) 0.9 % 25 mL IVPB: 2 mg | INTRAVENOUS | @ 16:00:00 | Stop: 2023-03-07

## 2023-03-07 MED ADMIN — sodium chloride (NS) 0.9 % infusion: 100 mL/h | INTRAVENOUS | @ 16:00:00

## 2023-03-07 MED ADMIN — heparin, porcine (PF) 100 unit/mL injection 500 Units: 500 [IU] | INTRAVENOUS | @ 16:00:00 | Stop: 2023-03-08

## 2023-03-07 MED FILL — LOSARTAN 50 MG TABLET: ORAL | 30 days supply | Qty: 30 | Fill #1

## 2023-03-07 MED FILL — CETIRIZINE 10 MG TABLET: ORAL | 30 days supply | Qty: 30 | Fill #1

## 2023-03-07 MED FILL — PREDNISONE 50 MG TABLET: 5 days supply | Qty: 20 | Fill #4

## 2023-03-07 NOTE — Unmapped (Signed)
Patient arrived to chair 20 for scheduled treatment today. Labs within parameter for tx today. Port was flushed, + BR. Pt. completed and tolerated tx well. Port was flushed, +BR. Port was de-accessed and patient discharged with no additional needs, AVS declined.

## 2023-03-07 NOTE — Unmapped (Signed)
[facility]ukemia [facility] Follow Up Visit Note     [name]  Patient Age: 47 y.o.  Encounter Date: 03/07/2023    Primary Care Provider:  [name], MD    Referring Physician:  Lenon [name], AGNP  [address]  [id number]  [place],  [place] [address]    Cancer Diagnosis: Ph+ B-ALL; Initial Dx 10/29/2021  Cancer status: CR1, BCR/ABL p190 negative in marrow  Treatment Regimen: First Line, ponatinib + Maintenance (vincristine, prednisone) s/p truncated course of GRAAPH-2005, HyperCVAD  Treatment Goal: Curative  Comorbidities: HTN, anxiety, hemorrhoids with anal fissures  Transplant: referral deferred in CR1    Assessment/Plan:  Patricia [name] is a 47 y.o. female with past medical history of chronic right shoulder and back pain, hypertension, anxiety, and recently diagnosed Ph+ ALL. 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  Given her deep molecular remission and her intolerance of hyperCVAD, we are transitioned to maintenance chemotherapy.     She is now s/p 10 Cycle Maintenance ponatinib + vincristine + prednisone. Cell counts have remained stable since Ponatinib was restarted 6/27 (after a hold due to neutropenia). She is clear to proceed with the next cycle. She will be due for planned bmbx at the end of that cycle.    Trend amylase/lipase for next 1 month on ponatinib - they are downtrending   BP is improved s/p start of losartan.    Continue monthly IVIG on a separate date due to insurance       Ph+ALL, in remission: On GRAAPH 2005 + dasatinib. Completed 12/12 planned IT treatments for CNS ppx.  - Proceed with next cycle of Maintenance -  VCR, pred 100 D1-5   - ponatinib 15 mg dialy  - q 3 month BCR/ABL - next will be with next bmbx  - q 6 month bmbx - next - due 04/2023   - plan blina if ALL progresses  - scheduled orders signed through C14    Neutropenia due to TKI  - hold ponatinib as detailed above 03/07/2023    HTN: ponatinib likely contributes  - Losartan - start 02/08/23  - monitor BP trend at home    CMV viremia:- Dr. Kari [name], ICID is following. 09/2022 restarted Valcyte with the goal of controlling CMV and limiting neutropenia  - continue letermovir  - d/c CMV levels as of 03/2023 - per Dr. Kari [name]    Hypogammaglobulinemia:   - Monthly IVIG started 07/26/22 - now at Patricia [name] due to insurance coverage    Hemorrhoids, Anal fissure: persistent but stable, per patient 03/07/2023  - supportive care at home    Pancytopenia due to chemotherapy  - ANC and Platelets sufficient to proceed without changes 03/07/2023  - supportive care as detailed below when in nadir    Menses suppression and birth control: previously received Lupron with last dose 07/26/22   - Micronor - daily   Will need birth control while on chemo, including when on only TKI     Psychosocial distress: She reports a moderate level of anxiety regarding the management of the above.   - Counseling given   - Consider ref to comprehensive cancer support program     Patient-centered care/Shared decision-making:   We discussed the plan above at length. The patient and her husband actively contributed to the conversation. Specifically, her most important outcomes are:  Prolonging overall survival and Maintaining her overall functional activity     Supportive Care Needs: We recommend based on the patient???s underlying diagnosis and treatment history the following supportive care:    1. Antimicrobial prophylaxis:    Viral - valacyclovir and letermovir  Bacterial: Levofloxacin when ANC <0.5  Fungal: Fluconazole 200mg  when ANC <0.5  PJP: dapsone 100mg  daily    2. Blood product support:  Leukoreduced blood products are required.  Irradiated blood products are preferred, but in case of urgent transfusion needs non-irradiated blood products may be used: 2 units for Hg <=8.0.     Coordination of care:   - Continue maintenance q4 weeks     I personally spent 45 minutes face-to-face and non-face-to-face in the care of this patient, which includes all pre, intra, and post visit time on the date of service.  All documented time was specific to the E/M visit and does not include any procedures that may have been performed.        Patricia [name], AGPCNP-BC  Nurse Practitioner  Hematology/Oncology  [facility]    Patricia Aloe, MD was available  Leukemia Program  Division of Hematology  [facility]      Nurse Navigator (non-clinical trial patients): Patricia Lamp, RN        Tel. [phone]       Fax. [phone]  Toll-free appointments: [phone]  Scheduling assistance: [phone]  After hours/weekends: [phone] (ask for adult hematology/oncology on-call)      History of Present Illness:   Patricia [name] is a 47 y.o. female with past medical history noted as above who presents for follow up Ph+ ALL.      Her social history is notable for being a Runner, broadcasting/film/video.  She was recently married. She lives with her husband.    Interim history  Since her last visit    Thumbs - more pain yesterday but overall they have not been bothersome.  Slight tremor when doing painting with fine brush.  No n/v  Flare of hemorrhoids. Was doing some lifting (cinder blocks). Improved with supportive care. She's interested in a GI referral if she continues to do well overall (no new neutropenia)  Stopped Lexapro but that did not work out and she is back on it. Had a nervous breakdown for a day. She feels better on Lexapro again.        Oncology History is as below:   Hematology/Oncology History Overview Note   Referring/Local Oncologist:    Diagnosis:   10/29/2021  Bone marrow, left iliac, aspiration and biopsy  -  Hypercellular bone marrow (greater than 90%) involved by B lymphoblastic leukemia (~95%blasts by morphologic assessment of aspirate smears and touch preps)  - Abnormal Karyotype: 47,XX,t(9;22)(q34;q11.2),+der(22)t(9;22)[18]/46,XX[2]     Abnormal FISH:  A BCR/ABL1 interphase FISH assay showed a signal pattern consistent with a BCR::ABL1 rearrangement and the 9;22 translocation in 88% of the 100 cells scored. The majority of the abnormal cells (64/88) contained an additional BCR/ABL1 fusion signal, while 9/88 abnormal cells contained an additional ABL1 and ASS1 signal.       Genetics:   Karyotype/FISH:   RESULTS   Date Value Ref Range Status   10/28/2021   Final    NOTE: This report reflects a combined study from a peripheral blood and a bone marrow core biopsy. Eleven cells from the peripheral blood and nine cells from the bone marrow core biopsy were analyzed. The BCR/ABL1 FISH analysis was performed on the peripheral blood.  Abnormal Karyotype:  47,XX,t(9;22)(q34;q11.2),+der(22)t(9;22)[18]/46,XX[2]    Abnormal FISH:  A BCR/ABL1 interphase FISH assay showed a signal pattern consistent with a BCR::ABL1 rearrangement and the 9;22 translocation in 88% of the 100 cells scored. The majority of the abnormal cells (64/88) contained an additional BCR/ABL1 fusion signal, while 9/88 abnormal cells contained an additional ABL1 and ASS1 signal.           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  MRD by flow insufficient, p190 2/100,000  12/10/2021: Cycle 2 B cycle + rituximab  12/14/21: IT#4  12/17/21: IT#5  01/10/22: Cycle 3 A cycle + rituximab  01/11/22: IT #6  01/24/22: Bmbx - MRD-neg by PCR (p190)   02/17/22: C4 B cycle + ritux  02/18/22: IT#7  03/21/22: C5 A cycle + ritux (4th dose)  03/22/22: IT#8  04/29/22: Bmbx - MRD-neg by PCR (p190)   05/18/22: restart dasatinib 70mg  s/p neutropenia  05/26/22: Start maintenance vincristine 2mg , Prednisone 200mg  D1-5   06/02/22: IT #9  06/27/22: C2 Maintenance: vincristine 2mg , Prednisone 200mg  D1-5, HOLD dasatinib due to neutropenia  07/04/22: RESTART dasatinib 70mg   07/21/22: IT #10  07/26/22: C3 Maintenance: vincristine 2mg , Prednisone 200mg  D1-5, dasatinib 70 mg  08/08/22: IT #11 - prophylaxis completed  08/23/22: C4 Maintenance: vincristine 2mg , Prednisone 200mg  D1-5, dasatinib 70 mg  09/20/22: C5 Maintenance: vincristine 2mg , Prednisone 200mg  D1-5, dasatinib 70 mg  10/04/22: Bone marrow biopsy - continued remission, MRD and BCR/ABL p190 negative  10/18/22: C6 Maintenance. ANC 0.4, HOLD dasatinib, continue vincristine 2mg  and Prednisone 200mg  D1-5  11/15/22: C7 Maintenance ANC 0.4. HOLD TKI, continue vincristine 2mg  and Prednisone 200mg  D1-5  ~11/30/22: Start ponatinib 15 mg daily  12/13/22: C8 Maintenance ANC 1.2. ponatinib 15mg , vincristine 2mg  D1 and Prednisone 200mg  D1-5  01/10/23: C9 Maintenance Anc 0.7. HOLD ponatinib, continue vincristine 2mg  D1 and Prednisone 200mg  D1-5  01/26/23: ANC 1.7, restart ponatinib 15 mg  02/08/23: C10 Maintenance. ANC is 1.4 - continue ponatinib  03/07/23: C11 Maintenance ANC is 1.1. continue ponatinib           Review of Systems:   ROS reviewed and negative except as noted in H and P     Allergies:  Allergies   Allergen Reactions    Erythromycin Hives     Other reaction(s): Not available    Other      Pt cant take ibuprofen due to condition.    Sulfa (Sulfonamide Antibiotics) Anaphylaxis     Other reaction(s): Not available    Azithromycin        Medications:     Current Outpatient Medications:     acetaminophen (TYLENOL 8 HOUR) 650 MG CR tablet, Take 2 tablets (1,300 mg total) by mouth every eight (8) hours as needed for pain., Disp: , Rfl:     albuterol HFA 90 mcg/actuation inhaler, Inhale 2 puffs every six (6) hours as needed for wheezing., Disp: , Rfl:     aspirin (ECOTRIN) 81 MG tablet, Take 1 tablet (81 mg total) by mouth daily., Disp: , Rfl:     cetirizine (ZYRTEC) 10 MG tablet, Take 1 tablet (10 mg total) by mouth nightly., Disp: 30 tablet, Rfl: 2  dapsone 100 MG tablet, TAKE 1 TABLET(100 MG) BY MOUTH DAILY, Disp: 30 tablet, Rfl: 11    losartan (COZAAR) 50 MG tablet, Take 1 tablet (50 mg total) by mouth daily., Disp: 30 tablet, Rfl: 3    norethindrone (MICRONOR) 0.35 mg tablet, Take 1 tablet by mouth daily., Disp: 84 tablet, Rfl: 3    PONATinib (ICLUSIG) 15 mg tablet, Take 1 tablet (15 mg total) by mouth daily. Swallow tablets whole. Do not crush, break, cut or chew tablets., Disp: 30 tablet, Rfl: 5    predniSONE (DELTASONE) 50 MG tablet, Take 4 tablets (200 mg total) by mouth once daily on days 1-5 of each cycle., Disp: 20 tablet, Rfl: 5    rosuvastatin (CRESTOR) 10 MG tablet, Take 1 tablet (10 mg total) by mouth nightly., Disp: 90 tablet, Rfl: 3    tizanidine (ZANAFLEX) 4 MG tablet, Take 1 tablet (4 mg total) by mouth nightly., Disp: 90 tablet, Rfl: 2    valACYclovir (VALTREX) 500 MG tablet, Take 1 tablet (500 mg total) by mouth daily., Disp: 30 tablet, Rfl: 12    escitalopram oxalate (LEXAPRO) 10 MG tablet, Take 1 tablet (10 mg total) by mouth daily., Disp: , Rfl:     letermovir (PREVYMIS) 480 mg tablet, Take 1 tablet (480 mg total) by mouth daily., Disp: 28 tablet, Rfl: 5    levalbuterol (XOPENEX) 0.31 mg/3 mL nebulizer solution, Inhale 3 mL (0.31 mg total) by nebulization every four (4) hours as needed for wheezing (cough)., Disp: 45 mL, Rfl: 0  No current facility-administered medications for this visit.    Facility-Administered Medications Ordered in Other Visits:     heparin, porcine (PF) 100 unit/mL injection 500 Units, 500 Units, Intravenous, Q30 Min PRN, Patricia Lew, MD, 500 Units at 03/07/23 1203    OKAY TO SEND MEDICATION/CHEMOTHERAPY TO OUTPATIENT UNIT, , Other, Once, Patricia [name], AGNP    sodium chloride (NS) 0.9 % infusion, 100 mL/hr, Intravenous, Continuous, Patricia [name], AGNP, Stopped at 03/07/23 1200    Medical History:  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    EBV infection        Social History:  Social History     Social History Narrative    Not on file       Family History:  No family history on file.    Objective:   BP 122/89  - Pulse 86  - Temp 37.1 ??C (98.8 ??F) (Oral)  - Resp 15  - Ht 170.2 cm (5' 7.01)  - Wt 84.9 kg (187 lb 2.7 oz)  - SpO2 95%  - BMI 29.31 kg/m??     Physical Exam:  GENERAL: well-appearing  woman   HEART: Normal color, not excessive pallor.  CHEST/LUNG: Normal work of breathing. No dyspnea with conversation. No crackles. Regular rate/rhythm  ABDOMEN: No obvious distention.    EXTREMITIES: No edema, cyanosis or clubbing.   SKIN: no rashes noted  NEURO EXAM: Grossly intact.       Test Results:  03/07/2023 CBC/d and CMP reviewed with patient

## 2023-03-07 NOTE — Unmapped (Signed)
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 She is feeling well overall. She has had no fevers recently, no nausea/vomiting, constipation/diarrhea, headaches/myalgias, abd pain, rash/skin changes. She is taking all medications as prescribed without issue. She does note she did not taper off of Lexapro and she stopped it cold [place], and that didn't feel like it went well for her - she ended up having more anxiety so ended up restarting it. She has tried other SSRIs previously which didn't work for her so she will plan to stick with this. The original thought was also maybe that it is impacting her libido and weight gain, but she will continue to work through other strategies and discuss pain with intercourse with gyn. Taking losartan and feels like BP has improved. Planning to go to [facility] next week for next IVIG dose.     Labs: WBC 3.6, ANC 1.1, Hgb 11.5, PLT 162; CMP WNL, amylase/lipase 125/69 (asymptomatic); lipid panel from 11/2822 with TC 231, TG 252, LDL-c 144  CMV viral load undetectable last done on 02/07/23  Total lgG 237 from 11/27 (now getting monthly IVIG); absolute CD4 count 276  BCR-ABL p190 6/11 negative  BMBx (10/04/22): <5% blasts, MRD negative    Oncologic History:  Hematology/Oncology History Overview Note   Referring/Local Oncologist:    Diagnosis:   10/29/2021  Bone marrow, left iliac, aspiration and biopsy  -  Hypercellular bone marrow (greater than 90%) involved by B lymphoblastic leukemia (~95%blasts by morphologic assessment of aspirate smears and touch preps)  - Abnormal Karyotype: 47,XX,t(9;22)(q34;q11.2),+der(22)t(9;22)[18]/46,XX[2]     Abnormal FISH:  A BCR/ABL1 interphase FISH assay showed a signal pattern consistent with a BCR::ABL1 rearrangement and the 9;22 translocation in 88% of the 100 cells scored. The majority of the abnormal cells (64/88) contained an additional BCR/ABL1 fusion signal, while 9/88 abnormal cells contained an additional ABL1 and ASS1 signal.       Genetics:   Karyotype/FISH:   RESULTS   Date Value Ref Range Status   10/28/2021   Final    NOTE: This report reflects a combined study from a peripheral blood and a bone marrow core biopsy. Eleven cells from the peripheral blood and nine cells from the bone marrow core biopsy were analyzed. The BCR/ABL1 FISH analysis was performed on the peripheral blood.     Abnormal Karyotype:  47,XX,t(9;22)(q34;q11.2),+der(22)t(9;22)[18]/46,XX[2]    Abnormal FISH:  A BCR/ABL1 interphase FISH assay showed a signal pattern consistent with a BCR::ABL1 rearrangement and the 9;22 translocation in 88% of the 100 cells scored. The majority of the abnormal cells (64/88) contained an additional BCR/ABL1 fusion signal, while 9/88 abnormal cells contained an additional ABL1 and ASS1 signal.           Pertinent Phenotypic data:  CD19 98%  CD20 on diagnosis 51%  CD22 98%      Treatment Timeline:  10/29/2021: Bone marrow biopsy: Ph+ ALL, 51% expression of CD20  10/30/21: Cycle 1 day 1 GRAAPH-2005 induction  11/03/21: ITT #1   11/12/2021: IT #2  11/18/2021: IT #3  11/29/2021: Post cycle 1 bone marrow biopsy: Morphologic  CR.  MRD by flow insufficient, p190 2/100,000  12/10/2021: Cycle 2 B cycle + rituximab  12/14/21: IT#4  12/17/21: IT#5  01/10/22: Cycle 3 A cycle + rituximab  01/11/22: IT #6  01/24/22: Bmbx - MRD-neg by PCR (p190)   02/17/22: C4 B cycle + ritux  02/18/22: IT#7  03/21/22: C5 A cycle + ritux (4th dose)  03/22/22: IT#8  04/29/22: Bmbx - MRD-neg by PCR (p190)   05/18/22: restart dasatinib 70mg  s/p neutropenia  05/26/22: Start maintenance vincristine 2mg , Prednisone 200mg  D1-5   06/02/22: IT #9  06/27/22: C2 Maintenance: vincristine 2mg , Prednisone 200mg  D1-5, HOLD dasatinib due to neutropenia  07/04/22: RESTART dasatinib 70mg   07/21/22: IT #10  07/26/22: C3 Maintenance: vincristine 2mg , Prednisone 200mg  D1-5, dasatinib 70 mg  08/08/22: IT #11 - prophylaxis completed  08/23/22: C4 Maintenance: vincristine 2mg , Prednisone 200mg  D1-5, dasatinib 70 mg  09/20/22: C5 Maintenance: vincristine 2mg , Prednisone 200mg  D1-5, dasatinib 70 mg  10/04/22: Bone marrow biopsy - continued remission, MRD and BCR/ABL p190 negative  10/18/22: C6 Maintenance. ANC 0.4, HOLD dasatinib, continue vincristine 2mg  and Prednisone 200mg  D1-5  11/15/22: C7 Maintenance ANC 0.4. HOLD TKI, continue vincristine 2mg  and Prednisone 200mg  D1-5  ~11/30/22: Start ponatinib 15 mg daily  12/13/22: C8 Maintenance ANC 1.2. ponatinib 15mg , vincristine 2mg  D1 and Prednisone 200mg  D1-5  01/10/23: C9 Maintenance Anc 0.7. HOLD ponatinib, continue vincristine 2mg  D1 and Prednisone 200mg  D1-5  01/26/23: ANC 1.7, restart ponatinib 15 mg  02/08/23: C10 Maintenance. 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)   10/29/2021 Initial Diagnosis    B-cell acute lymphoblastic leukemia (ALL) (CMS-HCC)     10/30/2021 - 03/30/2022 Chemotherapy    IP/OP LEUKEMIA GRAAPH-2005 < 60 YO (OP PEGFILGRASTIM ON DAY 7)  [No description for this plan]     01/06/2022 - 04/07/2022 Endocrine/Hormone Therapy    OP LEUPROLIDE (LUPRON) 11.25 MG EVERY 3 MONTHS  Plan Provider: [name], MD     05/26/2022 -  Chemotherapy    OP LEUKEMIA VINCRISTINE  vinCRIStine 2 mg IV on day 1     07/26/2022 - 07/26/2022 Endocrine/Hormone Therapy    OP LEUPROLIDE (LUPRON) 11.25 MG EVERY 3 MONTHS  Plan Provider: [name], MD         Weight and Vitals:  Wt Readings from Last 3 Encounters:   03/07/23 84.9 kg (187 lb 2.7 oz)   02/14/23 84.8 kg (187 lb)   02/07/23 85.5 kg (188 lb 7.9 oz)     Temp Readings from Last 3 Encounters:   03/07/23 37.1 ??C (98.8 ??F) (Oral)   02/14/23 35.6 ??C (96.1 ??F) (Temporal)   02/07/23 37.2 ??C (99 ??F) (Oral)     BP Readings from Last 3 Encounters:   03/07/23 122/89   02/14/23 129/91   02/07/23 134/91     Pulse Readings from Last 3 Encounters:   03/07/23 86   02/14/23 96   02/07/23 75       Pertinent Labs:  Lab on 03/07/2023   Component Date Value Ref Range Status    Sodium 03/07/2023 143  135 - 145 mmol/L Final    Potassium 03/07/2023 3.7  3.4 - 4.8 mmol/L Final    Chloride 03/07/2023 111 (H)  98 - 107 mmol/L Final    CO2 03/07/2023 26.0  20.0 - 31.0 mmol/L Final    Anion Gap 03/07/2023 6  5 - 14 mmol/L Final    BUN 03/07/2023 16  9 - 23 mg/dL Final    Creatinine 19/14/7829 0.70  0.55 - 1.02 mg/dL Final    BUN/Creatinine Ratio 03/07/2023 23   Final    eGFR CKD-EPI (2021) Female 03/07/2023 >90  >=60 mL/min/1.5m2 Final    eGFR calculated with CKD-EPI 2021 equation in accordance with [employer] and [employer] of Nephrology Task Force recommendations.    Glucose 03/07/2023 136  70 - 179 mg/dL Final    Calcium 56/21/3086 9.3  8.7 - 10.4 mg/dL Final    Albumin 57/84/6962 4.3  3.4 - 5.0 g/dL Final    Total Protein 03/07/2023 7.1  5.7 - 8.2 g/dL Final    Total Bilirubin 03/07/2023 0.6  0.3 - 1.2 mg/dL Final    AST 95/28/4132 42 (H)  <=34 U/L Final    ALT 03/07/2023 40  10 - 49 U/L Final    Alkaline Phosphatase 03/07/2023 60  46 - 116 U/L Final    Amylase 03/07/2023 125 (H)  30 - 118 U/L Final    Lipase 03/07/2023 69 (H)  12 - 53 U/L Final    WBC 03/07/2023 3.6  3.6 - 11.2 10*9/L Final    RBC 03/07/2023 3.31 (L)  3.95 - 5.13 10*12/L Final    HGB 03/07/2023 11.5  11.3 - 14.9 g/dL Final    HCT 44/08/270 33.0 (L)  34.0 - 44.0 % Final    MCV 03/07/2023 99.6 (H)  77.6 - 95.7 fL Final    MCH 03/07/2023 34.8 (H)  25.9 - 32.4 pg Final    MCHC 03/07/2023 34.9  32.0 - 36.0 g/dL Final    RDW 53/66/4403 16.1 (H)  12.2 - 15.2 % Final    MPV 03/07/2023 9.1  6.8 - 10.7 fL Final Platelet 03/07/2023 162  150 - 450 10*9/L Final    Anisocytosis 03/07/2023 Slight (A)  Not Present Final    Neutrophils % 03/07/2023 31  % Final    Lymphocytes % 03/07/2023 62  % Final    Monocytes % 03/07/2023 5  % Final    Eosinophils % 03/07/2023 2  % Final    Basophils % 03/07/2023 0  % Final    Absolute Neutrophils 03/07/2023 1.1 (L)  1.8 - 7.8 10*9/L Final    Absolute Lymphocytes 03/07/2023 2.2  1.1 - 3.6 10*9/L Final    Absolute Monocytes 03/07/2023 0.2 (L)  0.3 - 0.8 10*9/L Final    Absolute Eosinophils 03/07/2023 0.1  0.0 - 0.5 10*9/L Final    Absolute Basophils 03/07/2023 0.0  0.0 - 0.1 10*9/L Final    Smear Review Comments 03/07/2023 See Comment (A)  Undefined Final    Slide reviewed.  [id number]       Allergies:   Allergies   Allergen Reactions    Erythromycin Hives     Other reaction(s): Not available    Other      Pt cant take ibuprofen due to condition.    Sulfa (Sulfonamide Antibiotics) Anaphylaxis     Other reaction(s): Not available    Azithromycin        Drug Interactions: none identified; ponatinib is a major 3A4 substrate, caution with 3A4 inhibitors; letermovir is a mod 3A4 inhibitor (no adjustments necessary for ponatinib/VCR, monitor)      Current Medications:  Current Outpatient Medications   Medication Sig Dispense Refill    acetaminophen (TYLENOL 8 HOUR) 650 MG CR tablet Take 2 tablets (1,300 mg total) by mouth every eight (8) hours as needed for pain.  albuterol HFA 90 mcg/actuation inhaler Inhale 2 puffs every six (6) hours as needed for wheezing.      aspirin (ECOTRIN) 81 MG tablet Take 1 tablet (81 mg total) by mouth daily.      cetirizine (ZYRTEC) 10 MG tablet Take 1 tablet (10 mg total) by mouth nightly. 30 tablet 2    dapsone 100 MG tablet TAKE 1 TABLET(100 MG) BY MOUTH DAILY 30 tablet 11    letermovir (PREVYMIS) 480 mg tablet Take 1 tablet (480 mg total) by mouth daily. 28 tablet 5    levalbuterol (XOPENEX) 0.31 mg/3 mL nebulizer solution Inhale 3 mL (0.31 mg total) by nebulization every four (4) hours as needed for wheezing (cough). 45 mL 0    losartan (COZAAR) 50 MG tablet Take 1 tablet (50 mg total) by mouth daily. 30 tablet 3    norethindrone (MICRONOR) 0.35 mg tablet Take 1 tablet by mouth daily. 84 tablet 3    PONATinib (ICLUSIG) 15 mg tablet Take 1 tablet (15 mg total) by mouth daily. Swallow tablets whole. Do not crush, break, cut or chew tablets. 30 tablet 5    predniSONE (DELTASONE) 50 MG tablet Take 4 tablets (200 mg total) by mouth once daily on days 1-5 of each cycle. 20 tablet 5    rosuvastatin (CRESTOR) 10 MG tablet Take 1 tablet (10 mg total) by mouth nightly. 90 tablet 3    tizanidine (ZANAFLEX) 4 MG tablet Take 1 tablet (4 mg total) by mouth nightly. 90 tablet 2    triamcinolone (KENALOG) 0.1 % cream Apply topically two (2) times a day for 14 days. 45 g 0    valACYclovir (VALTREX) 500 MG tablet Take 1 tablet (500 mg total) by mouth daily. 30 tablet 12     No current facility-administered medications for this visit.       Adherence: No barriers identified/no missed doses.       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 Patient finished 5 cycles of hyperCVAD + rituximab + dasatinib before transitioning to VCR/dasatinib/prednisone maintenance . She was recently transitioned from dasatinib to ponatinib in April 2024, given difficulty with cytopenias on dasatinib (had been on dasatinib 70 mg daily and unable to titrate to normal maintenance dosing due to neutropenia). Previous cytopenias may have been CMV and/or valcyte related as well, and she was transitioned to letermovir by ICID to avoid valcyte-induced myelosuppression. CMV has recently remained undetectable and is pending from today. She was transitioned to ponatinib 15 mg daily with the thought that neutropenia may have been secondary to dasatinib (started at 15 mg dosing since BCR-ABL negative). Most recent BMBx (10/04/22) showed MRD negative disease. She is tolerating ponatinib well thus far, and her ANC remains stable. Receiving IVIG to minimize infectious complications.     Plan:   1) Ph+ ALL  - Continue vincristine IV on day 1 of 28 day cycle (due today)  - Continue prednisone 200 mg (4 tablets) days 1-5 of 28 day cycle (will restart today)  - Continue ponatinib 15 mg daily (would hold if ANC <0.5 and restart when ANC > 1.0)  - Trend amylase/lipase, however elevations are asymptomatic at this time  - #12 of 12 IT therapy complete  -  RTC in 4 weeks for labs/APP visit    2) Infection ppx   -If ANC < 0.5, add levofloxacin 500 mg daily and fluconazole 200 mg daily  -Continue dapsone 100 mg PO  daily for PJP ppx  -Continue letermovir 480 mg daily (per ICID continue throughout duration of treatment)  -Continue valacyclovir 500 mg daily  -Continue IVIG - dose due 8/12 and continue every 4 weeks (scheduled to be given at [facility])  -Per ICID, no further CMV monitoring    3) CV  - Continue aspirin 81 mg with ponatinib (hold if PLT ever < 50)  - Continue rosuvastatin 10 mg daily with ponatinib. Repeat fasting lipid panel after about 3 months of therapy  - Continue losartan 50 mg daily (previously on this med and dose, tolerated well), CTM BP at home    4) Supportive Care  - Continue Tylenol prn for pain. Check temperature before Tylenol dose and avoid taking NSAIDs.  - Can take Dulcolax prn for constipation  - Can take famotidine if having acid reflex from steroids.   - Most recent dose of Lupron on 07/26/22, which was final dose. Currently on daily Micronor. She can follow with gynecology for alternatives as needed. Will try water based lubricant but should also discuss with gyn her pain with sex - she will work on making a follow up appt.   - Continue escitalopram 10 mg daily (she attempted to stop but resulted in mood changes). For now, she'd like to stay with this option. In the future, we can refer to PCP and have them consider alternative (wellbutrin, or SNRI) for weight neutral option.    F/u:  Future Appointments   Date Time Provider Department Center   03/07/2023 10:00 AM ONCHEM LEUKEMIA PHARMACIST [id number]   03/07/2023 11:45 AM ONCINF CHAIR 17 [id number]   03/13/2023 11:15 AM UNCTIF 18 [facility]   05/01/2023  2:00 PM Gaffney, Charleen Kirks, MD [id number]     I spent 15 minutes in direct patient care    Ronnald Collum, PharmD, BCOP, CPP  Hematology/Oncology Clinical Pharmacist  Pager [phone]

## 2023-03-07 NOTE — Unmapped (Signed)
Lab on 03/07/2023   Component Date Value Ref Range Status    Sodium 03/07/2023 143  135 - 145 mmol/L Final    Potassium 03/07/2023 3.7  3.4 - 4.8 mmol/L Final    Chloride 03/07/2023 111 (H)  98 - 107 mmol/L Final    CO2 03/07/2023 26.0  20.0 - 31.0 mmol/L Final    Anion Gap 03/07/2023 6  5 - 14 mmol/L Final    BUN 03/07/2023 16  9 - 23 mg/dL Final    Creatinine 16/05/9603 0.70  0.55 - 1.02 mg/dL Final    BUN/Creatinine Ratio 03/07/2023 23   Final    eGFR CKD-EPI (2021) Female 03/07/2023 >90  >=60 mL/min/1.22m2 Final    eGFR calculated with CKD-EPI 2021 equation in accordance with [employer] and [employer] of Nephrology Task Force recommendations.    Glucose 03/07/2023 136  70 - 179 mg/dL Final    Calcium 54/04/8118 9.3  8.7 - 10.4 mg/dL Final    Albumin 14/78/2956 4.3  3.4 - 5.0 g/dL Final    Total Protein 03/07/2023 7.1  5.7 - 8.2 g/dL Final    Total Bilirubin 03/07/2023 0.6  0.3 - 1.2 mg/dL Final    AST 21/30/8657 42 (H)  <=34 U/L Final    ALT 03/07/2023 40  10 - 49 U/L Final    Alkaline Phosphatase 03/07/2023 60  46 - 116 U/L Final    Amylase 03/07/2023 125 (H)  30 - 118 U/L Final    Lipase 03/07/2023 69 (H)  12 - 53 U/L Final    WBC 03/07/2023 3.6  3.6 - 11.2 10*9/L Final    RBC 03/07/2023 3.31 (L)  3.95 - 5.13 10*12/L Final    HGB 03/07/2023 11.5  11.3 - 14.9 g/dL Final    HCT 84/69/6295 33.0 (L)  34.0 - 44.0 % Final    MCV 03/07/2023 99.6 (H)  77.6 - 95.7 fL Final    MCH 03/07/2023 34.8 (H)  25.9 - 32.4 pg Final    MCHC 03/07/2023 34.9  32.0 - 36.0 g/dL Final    RDW 28/41/3244 16.1 (H)  12.2 - 15.2 % Final    MPV 03/07/2023 9.1  6.8 - 10.7 fL Final    Platelet 03/07/2023 162  150 - 450 10*9/L Final    Anisocytosis 03/07/2023 Slight (A)  Not Present Final    Neutrophils % 03/07/2023 31  % Final    Lymphocytes % 03/07/2023 62  % Final    Monocytes % 03/07/2023 5  % Final    Eosinophils % 03/07/2023 2  % Final    Basophils % 03/07/2023 0  % Final    Absolute Neutrophils 03/07/2023 1.1 (L)  1.8 - 7.8 10*9/L Final    Absolute Lymphocytes 03/07/2023 2.2  1.1 - 3.6 10*9/L Final    Absolute Monocytes 03/07/2023 0.2 (L)  0.3 - 0.8 10*9/L Final    Absolute Eosinophils 03/07/2023 0.1  0.0 - 0.5 10*9/L Final    Absolute Basophils 03/07/2023 0.0  0.0 - 0.1 10*9/L Final    Smear Review Comments 03/07/2023 See Comment (A)  Undefined Final    Slide reviewed.  [id number]

## 2023-03-07 NOTE — Unmapped (Addendum)
You are doing well!    Proceed with vincristine today and 5 days of prednisone.  Continue the ponatinib and aspirin daily.  IVIG is scheduled.    If possible, you will see Dr. Senaida Ores next visit.

## 2023-03-08 NOTE — Unmapped (Signed)
[facility] Refill Coordination Note    Specialty Medication(s) to be Shipped:   Hematology/Oncology: Iclusig    Other medication(s) to be shipped: No additional medications requested for fill at this time     Patricia [name], DOB: 03/24/1976  Phone: [phone] (home)       All above HIPAA information was verified with patient.     Was a Nurse, learning disability used for this call? No    Completed refill call assessment today to schedule patient's medication shipment from the [facility] [phone]).  All relevant notes have been reviewed.     Specialty medication(s) and dose(s) confirmed: Regimen is correct and unchanged.   Changes to medications: [name] reports no changes at this time.  Changes to insurance: No  New side effects reported not previously addressed with a pharmacist or physician: None reported  Questions for the pharmacist: No    Confirmed patient received a Conservation officer, historic buildings and a Surveyor, mining with first shipment. The patient will receive a drug information handout for each medication shipped and additional FDA Medication Guides as required.       DISEASE/MEDICATION-SPECIFIC INFORMATION        N/A    SPECIALTY MEDICATION ADHERENCE     Medication Adherence    Patient reported X missed doses in the last month: 0  Specialty Medication: Iclusig 15 mg  Patient is on additional specialty medications: No  Informant: patient     Were doses missed due to medication being on hold? No    ICLUSIG 15 mg: 10  days of medicine on hand        REFERRAL TO PHARMACIST     Referral to the pharmacist: Not needed      [facility]     Shipping address confirmed in Epic.       Delivery Scheduled: Yes, Expected medication delivery date: 03/14/23.     Medication will be delivered via Next Day Courier to the prescription address in Epic [place].    Patricia [name]   [facility] Specialty Technician

## 2023-03-13 ENCOUNTER — Ambulatory Visit: Admit: 2023-03-13 | Discharge: 2023-03-14 | Payer: PRIVATE HEALTH INSURANCE

## 2023-03-13 MED ADMIN — acetaminophen (TYLENOL) tablet 650 mg: 650 mg | ORAL | @ 16:00:00 | Stop: 2023-03-13

## 2023-03-13 MED ADMIN — diphenhydrAMINE (BENADRYL) capsule/tablet 25 mg: 25 mg | ORAL | @ 16:00:00 | Stop: 2023-03-13

## 2023-03-13 MED ADMIN — heparin, porcine (PF) 100 unit/mL injection 500 Units: 500 [IU] | INTRAVENOUS | @ 18:00:00 | Stop: 2023-03-13

## 2023-03-13 MED ADMIN — immun glob G(IgG)-pro-IgA 0-50 (PRIVIGEN) 10 % intravenous solution 30 g: .4 g/kg | INTRAVENOUS | @ 16:00:00 | Stop: 2023-03-13

## 2023-03-13 MED ADMIN — dextrose 5 % infusion: 30 mL/h | INTRAVENOUS | @ 16:00:00 | Stop: 2023-03-13

## 2023-03-14 MED FILL — ICLUSIG 15 MG TABLET: ORAL | 30 days supply | Qty: 30 | Fill #3

## 2023-03-14 NOTE — Unmapped (Signed)
Pt presents for IVIG.  VSS, pt weight is 86 kg.  IV placed, premeds administered.  Pt aware of potential reaction/side effects, call bell within reach.    1149 IVIG 300 g started, to infuse at the following rates:    25 ml/hr for 15 min  86 ml/hr for 30 min  172 ml/hr for 30 min  412 ml/hr until complete.  1334 IVIG complete.  Pt tolerated without complication, VSS.  IV flushed per policy and d/c'd, gauze and coban applied.  Pt left clinic in no acute distress.

## 2023-04-05 DIAGNOSIS — D801 Nonfamilial hypogammaglobulinemia: Principal | ICD-10-CM

## 2023-04-05 NOTE — Unmapped (Unsigned)
[facility]ukemia [facility] Follow Up Visit Note     [name]  Patient Age: 47 y.o.  Encounter Date: 04/06/2023    Primary Care Provider:  [name], MD    Referring Physician:  Lenon Ahmadi, AGNP  [address]  [id number]  [place],  [place] [address]    Cancer Diagnosis: Ph+ B-ALL; Initial Dx 10/29/2021  Cancer status: CR1, BCR/ABL p190 negative in marrow  Treatment Regimen: First Line, ponatinib + Maintenance (vincristine, prednisone) s/p truncated course of GRAAPH-2005, HyperCVAD  Treatment Goal: Curative  Comorbidities: HTN, anxiety, hemorrhoids with anal fissures  Transplant: referral deferred in CR1    Assessment/Plan:  Patricia [name] is a 46 y.o. female with past medical history of chronic right shoulder and back pain, hypertension, anxiety, and recently diagnosed Ph+ ALL. 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  Given her deep molecular remission and her intolerance of hyperCVAD, we are transitioned to maintenance chemotherapy.     She is now s/p Cycle 11 of Maintenance ponatinib + vincristine + prednisone. Doing well. Cell counts have remained stable since Ponatinib was restarted 6/27 (after a hold due to neutropenia). She is clear to proceed with the next cycle. Plan for upcoming marrow to re-evaluate ds.     Continue monthly IVIG on a separate date due to insurance       Ph+ALL, in remission: On GRAAPH 2005 + dasatinib. Completed 12/12 planned IT treatments for CNS ppx.  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 09/2022 restarted Valcyte with the goal of controlling CMV and limiting neutropenia  - continue letermovir  - d/c CMV levels as of 03/2023 - per Dr. Kari Baars    Hypogammaglobulinemia:   - Monthly IVIG started 07/26/22 - now at [facility] due to insurance coverage    Hemorrhoids, Anal fissure: persistent but stable, per patient  - supportive care at home    Pancytopenia due to chemotherapy  - ANC and Platelets sufficient to proceed without changes 04/06/2023  - supportive care as detailed below when in nadir    Menses suppression and birth control: previously received Lupron with last dose 07/26/22   - Micronor - daily   Will need birth control while on chemo, including when on only TKI     Psychosocial distress: She reports a moderate level of anxiety regarding the management of the above.   - Counseling given   - Consider ref to comprehensive cancer support program     Patient-centered care/Shared decision-making:   We discussed the plan above at length. The patient and her husband actively contributed to the conversation. Specifically, her most important outcomes are:   Prolonging overall survival and Maintaining her overall functional activity     Supportive Care Needs: We recommend based on the patient???s underlying diagnosis and treatment history the following supportive care:  1. Antimicrobial prophylaxis:    Viral - valacyclovir and letermovir  Bacterial: Levofloxacin when ANC <0.5  Fungal: Fluconazole 200mg  when ANC <0.5  PJP: dapsone 100mg  daily    2. Blood product support:  Leukoreduced blood products are required.  Irradiated blood products are preferred, but in case of urgent transfusion needs non-irradiated blood products may be used: 2 units for Hg <=8.0.     Coordination of care:   - Continue maintenance q4 weeks   - Marrow upcoming     [name], MD   Leukemia Program  Division of Hematology  [facility]      Nurse Navigator (non-clinical trial patients): Elicia Lamp, RN        Tel. [phone]       Fax. [phone]  Toll-free appointments: [phone]  Scheduling assistance: [phone]  After hours/weekends: [phone] (ask for adult hematology/oncology on-call)      History of Present Illness:   Patricia [name] is a 47 y.o. female with past medical history noted as above who presents for follow up Ph+ ALL.      Her social history is notable for being a Runner, broadcasting/film/video.  She was recently married. She lives with her husband.    Interim history  Since her last visit, doing pretty well. No significant issues. Energy improving. Limited neuropathy.     Oncology History is as below:   Hematology/Oncology History Overview Note   Referring/Local Oncologist:    Diagnosis:   10/29/2021  Bone marrow, left iliac, aspiration and biopsy  -  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 The majority of the abnormal cells (64/88) contained an additional BCR/ABL1 fusion signal, while 9/88 abnormal cells contained an additional ABL1 and ASS1 signal.       Genetics:   Karyotype/FISH:   RESULTS   Date Value Ref Range Status   10/28/2021   Final    NOTE: This report reflects a combined study from a peripheral blood and a bone marrow core biopsy. Eleven cells from the peripheral blood and nine cells from the bone marrow core biopsy were analyzed. The BCR/ABL1 FISH analysis was performed on the peripheral blood.     Abnormal Karyotype:  47,XX,t(9;22)(q34;q11.2),+der(22)t(9;22)[18]/46,XX[2]    Abnormal FISH:  A BCR/ABL1 interphase FISH assay showed a signal pattern consistent with a BCR::ABL1 rearrangement and the 9;22 translocation in 88% of the 100 cells scored. The majority of the abnormal cells (64/88) contained an additional BCR/ABL1 fusion signal, while 9/88 abnormal cells contained an additional ABL1 and ASS1 signal.           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  MRD by flow insufficient, p190 2/100,000  12/10/2021: Cycle 2 B cycle + rituximab  12/14/21: IT#4  12/17/21: IT#5  01/10/22: Cycle 3 A cycle + rituximab  01/11/22: IT #6  01/24/22: Bmbx - MRD-neg by PCR (p190)  02/17/22: C4 B cycle + ritux  02/18/22: IT#7  03/21/22: C5 A cycle + ritux (4th dose)  03/22/22: IT#8  04/29/22: Bmbx - MRD-neg by PCR (p190)   05/18/22: restart dasatinib 70mg  s/p neutropenia  05/26/22: Start maintenance vincristine 2mg , Prednisone 200mg  D1-5   06/02/22: IT #9  06/27/22: C2 Maintenance: vincristine 2mg , Prednisone 200mg  D1-5, HOLD dasatinib due to neutropenia  07/04/22: RESTART dasatinib 70mg   07/21/22: IT #10  07/26/22: C3 Maintenance: vincristine 2mg , Prednisone 200mg  D1-5, dasatinib 70 mg  08/08/22: IT #11 - prophylaxis completed  08/23/22: C4 Maintenance: vincristine 2mg , Prednisone 200mg  D1-5, dasatinib 70 mg  09/20/22: C5 Maintenance: vincristine 2mg , Prednisone 200mg  D1-5, dasatinib 70 mg  10/04/22: Bone marrow biopsy - continued remission, MRD and BCR/ABL p190 negative  10/18/22: C6 Maintenance. ANC 0.4, HOLD dasatinib, continue vincristine 2mg  and Prednisone 200mg  D1-5  11/15/22: C7 Maintenance ANC 0.4. HOLD TKI, continue vincristine 2mg  and Prednisone 200mg  D1-5  ~11/30/22: Start ponatinib 15 mg daily  12/13/22: C8 Maintenance ANC 1.2. ponatinib 15mg , vincristine 2mg  D1 and Prednisone 200mg  D1-5  01/10/23: C9 Maintenance Anc 0.7. HOLD ponatinib, continue vincristine 2mg  D1 and Prednisone 200mg  D1-5  01/26/23: ANC 1.7, restart ponatinib 15 mg  02/08/23: C10 Maintenance. ANC is 1.4 - continue ponatinib  03/07/23: C11 Maintenance ANC is 1.1. continue ponatinib  04/06/23: C12 Maintenance           Review of Systems:   ROS reviewed and negative except as noted in H and P     Allergies:  Allergies   Allergen Reactions    Erythromycin Hives     Other reaction(s): Not available    Other      Pt cant take ibuprofen due to condition.    Sulfa (Sulfonamide Antibiotics) Anaphylaxis     Other reaction(s): Not available    Azithromycin        Medications:     Current Outpatient Medications:     acetaminophen (TYLENOL 8 HOUR) 650 MG CR tablet, Take 2 tablets (1,300 mg total) by mouth every eight (8) hours as needed for pain., Disp: , Rfl:     albuterol HFA 90 mcg/actuation inhaler, Inhale 2 puffs every six (6) hours as needed for wheezing., Disp: , Rfl:     aspirin (ECOTRIN) 81 MG tablet, Take 1 tablet (81 mg total) by mouth daily., Disp: , Rfl:     dapsone 100 MG tablet, TAKE 1 TABLET(100 MG) BY MOUTH DAILY, Disp: 30 tablet, Rfl: 11    escitalopram oxalate (LEXAPRO) 10 MG tablet, Take 1 tablet (10 mg total) by mouth daily., Disp: , Rfl:     norethindrone (MICRONOR) 0.35 mg tablet, Take 1 tablet by mouth daily., Disp: 84 tablet, Rfl: 3    PONATinib (ICLUSIG) 15 mg tablet, Take 1 tablet (15 mg total) by mouth daily. Swallow tablets whole. Do not crush, break, cut or chew tablets., Disp: 30 tablet, Rfl: 5    rosuvastatin (CRESTOR) 10 MG tablet, Take 1 tablet (10 mg total) by mouth nightly., Disp: 90 tablet, Rfl: 3    tizanidine (ZANAFLEX) 4 MG tablet, Take 1 tablet (4 mg total) by mouth nightly., Disp: 90 tablet, Rfl: 2    valACYclovir (VALTREX) 500 MG tablet, Take 1 tablet (500 mg total) by mouth daily., Disp: 30 tablet, Rfl: 12    cetirizine (ZYRTEC) 10 MG tablet, Take 1 tablet (10 mg total) by mouth nightly., Disp: 30 tablet, Rfl: 2    letermovir (PREVYMIS) 480 mg tablet, Take 1 tablet (480 mg total)  by mouth daily., Disp: 28 tablet, Rfl: 5    levalbuterol (XOPENEX) 0.31 mg/3 mL nebulizer solution, Inhale 3 mL (0.31 mg total) by nebulization every four (4) hours as needed for wheezing (cough)., Disp: 45 mL, Rfl: 0    losartan (COZAAR) 50 MG tablet, Take 1 tablet (50 mg total) by mouth daily., Disp: 30 tablet, Rfl: 3    predniSONE (DELTASONE) 50 MG tablet, Take 4 tablets (200 mg total) by mouth once daily on days 1-5 of each cycle., Disp: 20 tablet, Rfl: 5  No current facility-administered medications for this visit.    Medical History:  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    EBV infection        Social History:  Social History     Social History Narrative    Not on file       Family History:  No family history on file.    Objective:   BP 132/92  - Pulse 84  - Temp 36.5 ??C (97.7 ??F) (Temporal)  - Resp 17  - Wt 86 kg (189 lb 9.5 oz)  - SpO2 96%  - BMI 29.69 kg/m??     Physical Exam:  GENERAL: well-appearing  woman, ab husband   HEART: Normal color, not excessive pallor.  CHEST/LUNG: Normal work of breathing. No dyspnea with conversation.   ABDOMEN: No obvious distention.    EXTREMITIES: No edema, cyanosis or clubbing.   SKIN: no rashes noted  NEURO EXAM: Grossly intact.       Test Results:  04/06/2023 CBC/d and CMP reviewed with patient rate/rhythm  ABDOMEN: No obvious distention.    EXTREMITIES: No edema, cyanosis or clubbing.   SKIN: no rashes noted  NEURO EXAM: Grossly intact.       Test Results:  04/06/2023 CBC/d and CMP reviewed with patient

## 2023-04-06 ENCOUNTER — Ambulatory Visit
Admit: 2023-04-06 | Discharge: 2023-04-06 | Payer: PRIVATE HEALTH INSURANCE | Attending: Hematology | Primary: Hematology

## 2023-04-06 ENCOUNTER — Other Ambulatory Visit: Admit: 2023-04-06 | Discharge: 2023-04-06 | Payer: PRIVATE HEALTH INSURANCE

## 2023-04-06 ENCOUNTER — Ambulatory Visit: Admit: 2023-04-06 | Discharge: 2023-04-06 | Payer: PRIVATE HEALTH INSURANCE

## 2023-04-06 DIAGNOSIS — B259 Cytomegaloviral disease, unspecified: Principal | ICD-10-CM

## 2023-04-06 DIAGNOSIS — C9100 Acute lymphoblastic leukemia not having achieved remission: Principal | ICD-10-CM

## 2023-04-06 LAB — CBC W/ AUTO DIFF
BASOPHILS ABSOLUTE COUNT: 0.1 10*9/L (ref 0.0–0.1)
BASOPHILS RELATIVE PERCENT: 1.1 %
EOSINOPHILS ABSOLUTE COUNT: 0.1 10*9/L (ref 0.0–0.5)
EOSINOPHILS RELATIVE PERCENT: 2.4 %
HEMATOCRIT: 35.5 % (ref 34.0–44.0)
HEMOGLOBIN: 12.4 g/dL (ref 11.3–14.9)
LYMPHOCYTES ABSOLUTE COUNT: 1.6 10*9/L (ref 1.1–3.6)
LYMPHOCYTES RELATIVE PERCENT: 30.5 %
MEAN CORPUSCULAR HEMOGLOBIN CONC: 34.9 g/dL (ref 32.0–36.0)
MEAN CORPUSCULAR HEMOGLOBIN: 34.2 pg — ABNORMAL HIGH (ref 25.9–32.4)
MEAN CORPUSCULAR VOLUME: 98 fL — ABNORMAL HIGH (ref 77.6–95.7)
MEAN PLATELET VOLUME: 9.4 fL (ref 6.8–10.7)
MONOCYTES ABSOLUTE COUNT: 0.6 10*9/L (ref 0.3–0.8)
MONOCYTES RELATIVE PERCENT: 11.7 %
NEUTROPHILS ABSOLUTE COUNT: 2.9 10*9/L (ref 1.8–7.8)
NEUTROPHILS RELATIVE PERCENT: 54.3 %
PLATELET COUNT: 169 10*9/L (ref 150–450)
RED BLOOD CELL COUNT: 3.62 10*12/L — ABNORMAL LOW (ref 3.95–5.13)
RED CELL DISTRIBUTION WIDTH: 15.3 % — ABNORMAL HIGH (ref 12.2–15.2)
WBC ADJUSTED: 5.3 10*9/L (ref 3.6–11.2)

## 2023-04-06 LAB — COMPREHENSIVE METABOLIC PANEL
ALBUMIN: 4.1 g/dL (ref 3.4–5.0)
ALKALINE PHOSPHATASE: 64 U/L (ref 46–116)
ALT (SGPT): 29 U/L (ref 10–49)
ANION GAP: 7 mmol/L (ref 5–14)
AST (SGOT): 44 U/L — ABNORMAL HIGH (ref <=34)
BILIRUBIN TOTAL: 0.5 mg/dL (ref 0.3–1.2)
BLOOD UREA NITROGEN: 21 mg/dL (ref 9–23)
BUN / CREAT RATIO: 32
CALCIUM: 9.4 mg/dL (ref 8.7–10.4)
CHLORIDE: 110 mmol/L — ABNORMAL HIGH (ref 98–107)
CO2: 24 mmol/L (ref 20.0–31.0)
CREATININE: 0.65 mg/dL
EGFR CKD-EPI (2021) FEMALE: >90 mL/min/{1.73_m2} (ref >=60)
GLUCOSE RANDOM: 181 mg/dL — ABNORMAL HIGH (ref 70–179)
POTASSIUM: 3.8 mmol/L (ref 3.4–4.8)
PROTEIN TOTAL: 6.7 g/dL (ref 5.7–8.2)
SODIUM: 141 mmol/L (ref 135–145)

## 2023-04-06 LAB — AMYLASE: AMYLASE: 146 U/L — ABNORMAL HIGH (ref 30–118)

## 2023-04-06 LAB — LIPASE: LIPASE: 87 U/L — ABNORMAL HIGH (ref 12–53)

## 2023-04-06 LAB — SLIDE REVIEW

## 2023-04-06 MED ORDER — CETIRIZINE 10 MG TABLET
ORAL_TABLET | Freq: Every evening | ORAL | 2 refills | 30 days | Status: CP
Start: 2023-04-06 — End: 2024-04-05
  Filled 2023-04-06: qty 30, 30d supply, fill #0

## 2023-04-06 MED ORDER — PREDNISONE 50 MG TABLET
ORAL_TABLET | 5 refills | 0 days | Status: CP
Start: 2023-04-06 — End: ?
  Filled 2023-04-06: qty 20, 28d supply, fill #0

## 2023-04-06 MED ORDER — LETERMOVIR 480 MG TABLET
ORAL_TABLET | Freq: Every day | ORAL | 5 refills | 28 days | Status: CP
Start: 2023-04-06 — End: ?
  Filled 2023-04-06: qty 28, 28d supply, fill #0

## 2023-04-06 MED ORDER — LOSARTAN 50 MG TABLET
ORAL | 3 refills | 30 days | Status: CP
Start: 2023-04-06 — End: 2024-04-05
  Filled 2023-04-06: qty 30, 30d supply, fill #0

## 2023-04-06 MED ADMIN — vinCRIStine (ONCOVIN) 2 mg in sodium chloride (NS) 0.9 % 25 mL IVPB: 2 mg | INTRAVENOUS | @ 15:00:00 | Stop: 2023-04-06

## 2023-04-06 MED ADMIN — heparin, porcine (PF) 100 unit/mL injection 500 Units: 500 [IU] | INTRAVENOUS | @ 16:00:00 | Stop: 2023-04-07

## 2023-04-06 MED ADMIN — sodium chloride (NS) 0.9 % infusion: 100 mL/h | INTRAVENOUS | @ 15:00:00

## 2023-04-06 NOTE — Unmapped (Signed)
Chief concern/reason for visit:  Client here for infusion of Vincristine. Client has port positive for blood return. Client completed all infusions without complications. Client discharged home in stable condition.     Assessment/Plan:   Client receiving treatment for the following diagnosis:  1. 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

## 2023-04-06 NOTE — Unmapped (Signed)
RED ZONE Means: RED ZONE: Take action now!     You need to be seen right away  Symptoms are at a severe level of discomfort    Call 911 or go to your nearest  Hospital for help     - Bleeding that will not stop    - Hard to breathe    - New seizure - Chest pain  - Fall or passing out  -Thoughts of hurting    yourself or others      Call 911 if you are going into the RED ZONE                  YELLOW ZONE Means:     Please call with any new or worsening symptom(s), even if not on this list.  Call [phone]  After hours, weekends, and holidays - you will reach a long recording with specific instructions, If not in an emergency such as above, please listen closely all the way to the end and choose the option that relates to your need.   You can be seen by a provider the same day through our Same Day Acute Care for Patients with Cancer program.      YELLOW ZONE: Take action today     Symptoms are new or worsening  You are not within your goal range for:    - Pain    - Shortness of breath    - Bleeding (nose, urine, stool, wound)    - Feeling sick to your stomach and throwing up    - Mouth sores/pain in your mouth or throat    - Hard stool or very loose stools (increase in       ostomy output)    - No urine for 12 hours    - Feeding tube or other catheter/tube issue    - Redness or pain at previous IV or port/catheter site    - Depressed or anxiety   - Swelling (leg, arm, abdomen,     face, neck)  - Skin rash or skin changes  - Wound issues (redness, drainage,    re-opened)  - Confusion  - Vision changes  - Fever >100.4 F or chills  - Worsening cough with mucus that is    green, yellow, or bloody  - Pain or burning when going to the    bathroom  - Home Infusion Pump Issue- call    [phone]         Call your healthcare provider if you are going into the YELLOW ZONE     GREEN ZONE Means:  Your symptoms are under controls  Continue to take your medicine as ordered  Keep all visits to the provider GREEN ZONE: You are in control  No increase or worsening symptoms  Able to take your medicine  Able to drink and eat    - DO NOT use MyChart messages to report red or yellow symptoms. Allow up to 3    business days for a reply.  -MyChart is for non-urgent medication refills, scheduling requests, or other general questions.         [id number] Rev. 01/28/2022  Approved by Oncology Patient Education Committee         Lab on 04/06/2023   Component Date Value Ref Range Status    Sodium 04/06/2023 141  135 - 145 mmol/L Final    Potassium 04/06/2023 3.8  3.4 - 4.8 mmol/L Final    Chloride 04/06/2023 110 (H)  98 - 107 mmol/L Final    CO2 04/06/2023 24.0  20.0 - 31.0 mmol/L Final    Anion Gap 04/06/2023 7  5 - 14 mmol/L Final    BUN 04/06/2023 21  9 - 23 mg/dL Final    Creatinine 32/44/0102 0.65  0.55 - 1.02 mg/dL Final    BUN/Creatinine Ratio 04/06/2023 32   Final    eGFR CKD-EPI (2021) Female 04/06/2023 >90  >=60 mL/min/1.72m2 Final    eGFR calculated with CKD-EPI 2021 equation in accordance with [employer] and [employer] of Nephrology Task Force recommendations.    Glucose 04/06/2023 181 (H)  70 - 179 mg/dL Final    Calcium 72/53/6644 9.4  8.7 - 10.4 mg/dL Final    Albumin 03/47/4259 4.1  3.4 - 5.0 g/dL Final    Total Protein 04/06/2023 6.7  5.7 - 8.2 g/dL Final    Total Bilirubin 04/06/2023 0.5  0.3 - 1.2 mg/dL Final    AST 56/38/7564 44 (H)  <=34 U/L Final    ALT 04/06/2023 29  10 - 49 U/L Final    Alkaline Phosphatase 04/06/2023 64  46 - 116 U/L Final    Amylase 04/06/2023 146 (H)  30 - 118 U/L Final    Lipase 04/06/2023 87 (H)  12 - 53 U/L Final    WBC 04/06/2023 5.3  3.6 - 11.2 10*9/L Final    RBC 04/06/2023 3.62 (L)  3.95 - 5.13 10*12/L Final    HGB 04/06/2023 12.4  11.3 - 14.9 g/dL Final    HCT 33/29/5188 35.5  34.0 - 44.0 % Final    MCV 04/06/2023 98.0 (H)  77.6 - 95.7 fL Final    MCH 04/06/2023 34.2 (H)  25.9 - 32.4 pg Final    MCHC 04/06/2023 34.9  32.0 - 36.0 g/dL Final    RDW 41/66/0630 15.3 (H)  12.2 - 15.2 % Final    MPV 04/06/2023 9.4  6.8 - 10.7 fL Final    Platelet 04/06/2023 169  150 - 450 10*9/L Final    Neutrophils % 04/06/2023 54.3  % Final    Lymphocytes % 04/06/2023 30.5  % Final    Monocytes % 04/06/2023 11.7  % Final    Eosinophils % 04/06/2023 2.4  % Final    Basophils % 04/06/2023 1.1  % Final    Absolute Neutrophils 04/06/2023 2.9  1.8 - 7.8 10*9/L Final    Absolute Lymphocytes 04/06/2023 1.6  1.1 - 3.6 10*9/L Final    Absolute Monocytes 04/06/2023 0.6  0.3 - 0.8 10*9/L Final    Absolute Eosinophils 04/06/2023 0.1  0.0 - 0.5 10*9/L Final    Absolute Basophils 04/06/2023 0.1  0.0 - 0.1 10*9/L Final    Smear Review Comments 04/06/2023 See Comment (A)  Undefined Final    Slide reviewed.  [id number]

## 2023-04-09 NOTE — Unmapped (Signed)
[facility] Refill Coordination Note    Patricia [name], DOB: May 28, 1976  Phone: [phone] (home)       All above HIPAA information was verified with patient.         04/09/2023     8:33 AM   Specialty Rx Medication Refill Questionnaire   Which Medications would you like refilled and shipped? Iclusig   Please list all current allergies: None related to this med   Have you missed any doses in the last 30 days? No   Have you had any changes to your medication(s) since your last refill? No   How many days remaining of each medication do you have at home? 8   Have you experienced any side effects in the last 30 days? No   Please enter the full address (street address, city, state, zip code) where you would like your medication(s) to be delivered to. [address], [place] [address]   Please specify on which day you would like your medication(s) to arrive. Note: if you need your medication(s) within 3 days, please call the pharmacy to schedule your order at [phone]  04/13/2023   Has your insurance changed since your last refill? No   Would you like a pharmacist to call you to discuss your medication(s)? No   Do you require a signature for your package? (Note: if we are billing Medicare Part B or your order contains a controlled substance, we will require a signature) No         Completed refill call assessment today to schedule patient's medication shipment from the [facility] [phone]).  All relevant notes have been reviewed.       Confirmed patient received a Conservation officer, historic buildings and a Surveyor, mining with first shipment. The patient will receive a drug information handout for each medication shipped and additional FDA Medication Guides as required.         REFERRAL TO PHARMACIST     Referral to the pharmacist: Not needed      [facility]     Shipping address confirmed in Epic.     Delivery Scheduled: Yes, Expected medication delivery date: 04/13/23.     Medication will be delivered via Next Day Courier to the prescription address in Epic [place].    [name] [name]   [facility] Specialty Technician

## 2023-04-10 ENCOUNTER — Ambulatory Visit: Admit: 2023-04-10 | Discharge: 2023-04-11 | Payer: PRIVATE HEALTH INSURANCE

## 2023-04-10 MED ADMIN — diphenhydrAMINE (BENADRYL) capsule/tablet 25 mg: 25 mg | ORAL | @ 13:00:00 | Stop: 2023-04-10

## 2023-04-10 MED ADMIN — acetaminophen (TYLENOL) tablet 650 mg: 650 mg | ORAL | @ 13:00:00 | Stop: 2023-04-10

## 2023-04-10 MED ADMIN — dextrose 5 % infusion: 30 mL/h | INTRAVENOUS | @ 13:00:00 | Stop: 2023-04-10

## 2023-04-10 MED ADMIN — immun glob G(IgG)-pro-IgA 0-50 (PRIVIGEN) 10 % intravenous solution 30 g: .4 g/kg | INTRAVENOUS | @ 13:00:00 | Stop: 2023-04-10

## 2023-04-10 MED ADMIN — heparin, porcine (PF) 100 unit/mL injection 500 Units: 500 [IU] | INTRAVENOUS | @ 15:00:00 | Stop: 2023-04-10

## 2023-04-10 MED FILL — ROSUVASTATIN 10 MG TABLET: ORAL | 90 days supply | Qty: 90 | Fill #1

## 2023-04-10 NOTE — Unmapped (Signed)
Pt presents for IVIG.  VSS, pt weight is 87kg.  Right chest port accessed with 20g 3/4 huber needle, premeds administered.  Pt aware of potential reaction/side effects, call bell within reach.    0921 D5W started   0921 IVIG 30g started, to infuse at the following rates:    26 ml/hr for 15 min  87 ml/hr for 30 min  174 ml/hr for 30 min  418 ml/hr until complete.    1100 IVIG complete.  1103 D5W stopped.      Pt tolerated without complication, VSS.  Port flushed, heparin locked and de-accessed per policy.  Pt left clinic in no acute distress.

## 2023-04-11 DIAGNOSIS — C9100 Acute lymphoblastic leukemia not having achieved remission: Principal | ICD-10-CM

## 2023-04-12 ENCOUNTER — Ambulatory Visit
Admit: 2023-04-12 | Discharge: 2023-04-13 | Payer: PRIVATE HEALTH INSURANCE | Attending: Adult Health | Primary: Adult Health

## 2023-04-12 ENCOUNTER — Institutional Professional Consult (permissible substitution): Admit: 2023-04-12 | Discharge: 2023-04-13 | Payer: PRIVATE HEALTH INSURANCE

## 2023-04-12 DIAGNOSIS — C9100 Acute lymphoblastic leukemia not having achieved remission: Principal | ICD-10-CM

## 2023-04-12 LAB — CBC W/ AUTO DIFF
BASOPHILS ABSOLUTE COUNT: 0 10*9/L (ref 0.0–0.1)
BASOPHILS RELATIVE PERCENT: 0.4 %
EOSINOPHILS ABSOLUTE COUNT: 0 10*9/L (ref 0.0–0.5)
EOSINOPHILS RELATIVE PERCENT: 0.1 %
HEMATOCRIT: 33.1 % — ABNORMAL LOW (ref 34.0–44.0)
HEMOGLOBIN: 11.7 g/dL (ref 11.3–14.9)
LYMPHOCYTES ABSOLUTE COUNT: 1.8 10*9/L (ref 1.1–3.6)
LYMPHOCYTES RELATIVE PERCENT: 20.6 %
MEAN CORPUSCULAR HEMOGLOBIN CONC: 35.3 g/dL (ref 32.0–36.0)
MEAN CORPUSCULAR HEMOGLOBIN: 34.3 pg — ABNORMAL HIGH (ref 25.9–32.4)
MEAN CORPUSCULAR VOLUME: 97 fL — ABNORMAL HIGH (ref 77.6–95.7)
MEAN PLATELET VOLUME: 8.5 fL (ref 6.8–10.7)
MONOCYTES ABSOLUTE COUNT: 0.7 10*9/L (ref 0.3–0.8)
MONOCYTES RELATIVE PERCENT: 8.2 %
NEUTROPHILS ABSOLUTE COUNT: 6.1 10*9/L (ref 1.8–7.8)
NEUTROPHILS RELATIVE PERCENT: 70.7 %
PLATELET COUNT: 178 10*9/L (ref 150–450)
RED BLOOD CELL COUNT: 3.42 10*12/L — ABNORMAL LOW (ref 3.95–5.13)
RED CELL DISTRIBUTION WIDTH: 14.6 % (ref 12.2–15.2)
WBC ADJUSTED: 8.7 10*9/L (ref 3.6–11.2)

## 2023-04-12 LAB — COMPREHENSIVE METABOLIC PANEL
ALBUMIN: 4.2 g/dL (ref 3.4–5.0)
ALKALINE PHOSPHATASE: 54 U/L (ref 46–116)
ALT (SGPT): 38 U/L (ref 10–49)
ANION GAP: 7 mmol/L (ref 5–14)
AST (SGOT): 37 U/L — ABNORMAL HIGH (ref <=34)
BILIRUBIN TOTAL: 0.9 mg/dL (ref 0.3–1.2)
BLOOD UREA NITROGEN: 24 mg/dL — ABNORMAL HIGH (ref 9–23)
BUN / CREAT RATIO: 34
CALCIUM: 9.7 mg/dL (ref 8.7–10.4)
CHLORIDE: 106 mmol/L (ref 98–107)
CO2: 26 mmol/L (ref 20.0–31.0)
CREATININE: 0.71 mg/dL
EGFR CKD-EPI (2021) FEMALE: >90 mL/min/{1.73_m2} (ref >=60)
GLUCOSE RANDOM: 185 mg/dL — ABNORMAL HIGH (ref 70–179)
POTASSIUM: 3.8 mmol/L (ref 3.4–4.8)
PROTEIN TOTAL: 7.5 g/dL (ref 5.7–8.2)
SODIUM: 139 mmol/L (ref 135–145)

## 2023-04-12 LAB — SMEAR - BONE MARROW PATIENT

## 2023-04-12 LAB — SLIDE REVIEW

## 2023-04-12 MED ADMIN — heparin, porcine (PF) 100 unit/mL injection 500 Units: 500 [IU] | INTRAVENOUS | @ 13:00:00 | Stop: 2023-04-12

## 2023-04-12 MED ADMIN — midazolam (VERSED) injection 2 mg: 2 mg | INTRAVENOUS | @ 15:00:00 | Stop: 2023-04-12

## 2023-04-12 MED ADMIN — heparin, porcine (PF) 100 unit/mL injection 500 Units: 500 [IU] | INTRAVENOUS | @ 15:00:00 | Stop: 2023-04-12

## 2023-04-12 MED FILL — ICLUSIG 15 MG TABLET: ORAL | 30 days supply | Qty: 30 | Fill #4

## 2023-04-12 NOTE — Unmapped (Signed)
10:15 - pt ambulated with stable gait to treatment area for planned BMBx accompanied by family member     10:40 - pt consent obtained for planned BMBx - Performed TIME OUT prior to BMBx. Pt then medicated per order with Versed prior to procedure after verifying not driving home. Pt counts stable and no transfusions required. Port flushed and de-accessed per protocol.     11:10 - pt tolerated BMBx and pt ambulated with stale gait from treatment area accompanied by family member in stable condition

## 2023-04-12 NOTE — Unmapped (Signed)
1610 Port accessed per protocol with brisk blood return noted. Wasted of 10 ml than labs drawn, flushed with 20 ml normal saline followed with 500 units heparin. Dressed in sterile fashion. Blood sent for analysis.

## 2023-04-12 NOTE — Unmapped (Signed)
Please leave dressing in place and keep it dry for 24 hrs before removing. You can resume normal activities tomorrow, but take things easy today.     You may take tylenol as needed for discomfort at the area where the biopsy was taken.   After receiving Versed, please do not drive today.    If you have questions between 8am to 5 pm Monday through Friday please call [phone] and speak to the operator.      For emergencies, evenings or weekends, please call [phone] and ask for oncology fellow on call.     Reasons to call emergency line may include:   Fever of 100.5 or greater   Nausea and/or vomiting not relieved with nausea medicine   Diarrhea or constipation   Severe pain not relieved with usual pain regimen       Clinical Support on 04/12/2023   Component Date Value Ref Range Status    Sodium 04/12/2023 139  135 - 145 mmol/L Final    Potassium 04/12/2023 3.8  3.4 - 4.8 mmol/L Final    Chloride 04/12/2023 106  98 - 107 mmol/L Final    CO2 04/12/2023 26.0  20.0 - 31.0 mmol/L Final    Anion Gap 04/12/2023 7  5 - 14 mmol/L Final    BUN 04/12/2023 24 (H)  9 - 23 mg/dL Final    Creatinine 57/84/6962 0.71  0.55 - 1.02 mg/dL Final    BUN/Creatinine Ratio 04/12/2023 34   Final    eGFR CKD-EPI (2021) Female 04/12/2023 >90  >=60 mL/min/1.55m2 Final    eGFR calculated with CKD-EPI 2021 equation in accordance with [employer] and [employer] of Nephrology Task Force recommendations.    Glucose 04/12/2023 185 (H)  70 - 179 mg/dL Final    Calcium 95/28/4132 9.7  8.7 - 10.4 mg/dL Final    Albumin 44/08/270 4.2  3.4 - 5.0 g/dL Final    Total Protein 04/12/2023 7.5  5.7 - 8.2 g/dL Final    Total Bilirubin 04/12/2023 0.9  0.3 - 1.2 mg/dL Final    AST 53/66/4403 37 (H)  <=34 U/L Final    ALT 04/12/2023 38  10 - 49 U/L Final    Alkaline Phosphatase 04/12/2023 54  46 - 116 U/L Final    WBC 04/12/2023 8.7  3.6 - 11.2 10*9/L Final    RBC 04/12/2023 3.42 (L)  3.95 - 5.13 10*12/L Final    HGB 04/12/2023 11.7 11.3 - 14.9 g/dL Final    HCT 47/42/5956 33.1 (L)  34.0 - 44.0 % Final    MCV 04/12/2023 97.0 (H)  77.6 - 95.7 fL Final    MCH 04/12/2023 34.3 (H)  25.9 - 32.4 pg Final    MCHC 04/12/2023 35.3  32.0 - 36.0 g/dL Final    RDW 38/75/6433 14.6  12.2 - 15.2 % Final    MPV 04/12/2023 8.5  6.8 - 10.7 fL Final    Platelet 04/12/2023 178  150 - 450 10*9/L Final    Neutrophils % 04/12/2023 70.7  % Final    Lymphocytes % 04/12/2023 20.6  % Final    Monocytes % 04/12/2023 8.2  % Final    Eosinophils % 04/12/2023 0.1  % Final    Basophils % 04/12/2023 0.4  % Final    Absolute Neutrophils 04/12/2023 6.1  1.8 - 7.8 10*9/L Final    Absolute Lymphocytes 04/12/2023 1.8  1.1 - 3.6 10*9/L Final    Absolute Monocytes 04/12/2023 0.7  0.3 - 0.8 10*9/L Final    Absolute Eosinophils 04/12/2023 0.0  0.0 - 0.5 10*9/L Final    Absolute Basophils 04/12/2023 0.0  0.0 - 0.1 10*9/L Final    Smear Review Comments 04/12/2023 See Comment (A)  Undefined Final    Myelocytes present.  [id number]

## 2023-04-12 NOTE — Unmapped (Signed)
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)    Acute cough    Dyspepsia    Chronic frontal sinusitis    Pancytopenia (CMS-HCC)    Neutropenia with fever (CMS-HCC)    Blurred vision, bilateral    Anxiety    Hypogammaglobulinemia (CMS-HCC)    Hypokalemia    Acute tonsillitis    Allergic conjunctivitis    Chest pain with low risk for cardiac etiology    Chronic back pain    Chronic pansinusitis    Depression    Dog bite    Generalized anxiety disorder    Heart palpitations    Hydronephrosis, right    Hypertension    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)  C91.00 BCR::ABL1 p190     BCR::ABL1 p190     Hematopathology Order     Hematopathology Order     Cytogenetics Cancer/FISH NON-BLOOD     Cytogenetics Cancer/FISH NON-BLOOD     B-ALL Minimal Residual Disease (MRD), Flow Cytometry, Bone Marrow     B-ALL Minimal Residual Disease (MRD), Flow Cytometry, Bone Marrow     DNA Extract and Hold     DNA Extract and Hold     midazolam (VERSED) injection 2 mg     Smear for Bone Marrow Patients     Smear for Bone Marrow Patients     Cytogenetics AP Order     Cytogenetics AP Order          Premedication: Versed 2mg  IV  Driver confirmed: yes    Ordering Provider: [name], MD    Clinician(s) Performing Procedure: [name], AGNP        Bone Marrow Aspirate and Biopsy, right side    The risks, benefits, and alternatives to the procedure were discussed. All questions were answered. The patient verbalized understanding and signed informed consent. After a time-out in which his patient identifiers were checked by 2 providers, the patient was laid in prone position on the table.   The  posterior superior iliac spine and iliac crest were cleaned, prepped and draped in the usual sterile fashion.     Anesthetic agent used: 2% plain lidocaine.      Utilizing a Ranfac needle, a bone marrow aspiration and biopsy was performed.  Specimen was sent for routine histopathologic stains and sectioning, flow cytometry, cytogenetics, and molecular analysis.     A pressure dressing was applied to the biopsy site.  Patient tolerated the procedure well.  Hemostasis was confirmed upon discharge.     The patient was given verbal instructions for wound care, such as to keep the biopsy site dry, covered for 24 hours, and to call your physician for a temperature > 100.5.  Tylenol may be taken for discomfort.    Specimens Collected:  EDTA x 2  Heparin x 2  Core biopsy x 1

## 2023-04-25 MED ORDER — NORETHINDRONE (CONTRACEPTIVE) 0.35 MG TABLET
ORAL | 3 refills | 84 days | Status: CP
Start: 2023-04-25 — End: 2024-04-24

## 2023-04-30 NOTE — Unmapped (Signed)
OUTPATIENT ONCOLOGY PALLIATIVE CARE    Principal Diagnosis: Ms. Courts is a 47 y.o. 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    Assessment/Plan:   #Anxiety/Coping Support: Stable. Using excellent coping strategies. Celebrating disease control. Briefly stopped lexapro but then restarted when noticed worsening ability to cope and more frequent emotional breakdowns. Since restarting she is doing well on her current dose and reports no recent panic attacks. Is planning to re-establish with prior therapist. Also notes she has components of ADHD but feels Lexapro+Losartan treats that well. Does note some undesired sexual side effects that she is considering discussing with gyn alternative management approaches.   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 ANC now >1. Now being treated for CMV viremia as well.  - Cont tylenol 650mg  TID PRN  - Cont letermovir    #Hemorrhoids: Ongoing, but not worse. Limited by definitive management given fluctuating ANC  - Continue OTC topical treatments for now  - Defer to onc regarding if/when she would be a candidate for definitive surgical management if they persist    #Blurred Vision: Stable, but not resolved. Planning to get to eye doctor but still hasn't scheduled.     #Cancer-Related Pain: Improved and stable. Locates to large muscles and to area of recent BMBx. Also with muscle aches intermittnetly. Responsive to tylenol and tizanidine which she takes very intermittently.   - Cont tylenol PRN  - Cont tizandine PRN - okay to take up to 8mg  in one dose    #Advanced Care Planning: Patient celebrating recent BMBx results. Understands plan of care. Worries about recurrence..   - She and her husband have an excellent understanding of the current course of her treatment.  She recognizes that she is responding well and there is some uncertainty about how many rounds of treatment she will have to go through.  Has heard that the goal of treatment is curative, and she remains very hopeful for this.  Has been able to tolerate treatment fairly well to this point. Her major concern is about side effects from lumbar punctures.  Current focus is on cancer treatment and survival while maintaining quality of life is much as possible.    Health Care Decision Maker as of 04/30/2023    HCDM (patient stated preference): [name] Spouse - [phone]      # Controlled substances risk management.   - Patient does not have a signed pain medication agreement with our team.  No controlled substances being prescribed by our team currently.    - [place] database was reviewed today and it was appropriate.   - Urine drug screen was not performed at this visit. Findings: not applicable.   - Patient has received information about safe storage and administration of medications.   - Patient has not received a prescription for narcan; is not applicable.       F/u: 12 weeks    ----------------------------------------  Referring Provider: From inpatient palliative care team  Oncology Team: Malignant hematology  PCP: [name], MD    Interval Hx, 04/30/23:  Since our last visit, patient had a BMBx and it was  negative, but otherwise had no major events.     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 It was a putt-putt event with kids, adults, and competitive putt putt golfers. Overall, it was a big success. [place] police stopped by and brought breast cancer awareness vehicle.     Has up and down days related to treatment. Always feels amped up with steroids after chemo and then crashes for a while.     Has a lot on her plate the upcoming weekends.     Finally had short-term disability approved. Also has SSI disability.     On ROS, has some muscle pain related to activity. Hemorroids unchanged. Occasional constiaption, but manages to keep stools loose with coffee. Also notices ice cream makes stool loose.     In terms of mood, learning to manage stress and recognzie when things are outside of her control. Follows [employer] circle of control teaching. After initial shock of diagnosois, has been trying ot be positive and pay I tfoward. And trying not to worry about things she can't control. Has a really supportive spouse. Acknowlegeds fear of it recurring. Works on her companion journal for other oncology patients too.     HPI: Ph+ ALL, currently in remission.    Current cancer-directed therapy: maintenance vincristine 2g, Prednisone 200mg  D1-4 +dasatinib       Palliative Performance Scale: 90% - Ambulation: Full / Normal Activity, some evidence of disease / Self-Care:Full / Intake: Normal / Level of Conscious: Full      Coping/Support Issues: She and her husband have multiple stressors but overall feels like she is coping fairly well right now.  She will reach out to our palliative care team if new concerns emerge.    Goals of Care:  Focus on cancer directed therapy and survival while hoping to maintain her quality of life is much as possible    Social History:   Name of primary support: Husband [name]  occupation: 6 grade teacher      Advance Care Planning:   HCPOA: Spouse  Natural surrogate decision maker: Husband [name]  Living Will: no  ACP note:     Objective       Hematology/Oncology History Overview Note   Referring/Local Oncologist:    Diagnosis:   10/29/2021  Bone marrow, left iliac, aspiration and biopsy  -  Hypercellular bone marrow (greater than 90%) involved by B lymphoblastic leukemia (~95%blasts by morphologic assessment of aspirate smears and touch preps)  - Abnormal Karyotype: 47,XX,t(9;22)(q34;q11.2),+der(22)t(9;22)[18]/46,XX[2]     Abnormal FISH:  A BCR/ABL1 interphase FISH assay showed a signal pattern consistent with a BCR::ABL1 rearrangement and the 9;22 translocation in 88% of the 100 cells scored. The majority of the abnormal cells (64/88) contained an additional BCR/ABL1 fusion signal, while 9/88 abnormal cells contained an additional ABL1 and ASS1 signal.       Genetics:   Karyotype/FISH:   RESULTS   Date Value Ref Range Status   10/28/2021   Final    NOTE: This report reflects a combined study from a peripheral blood and a bone marrow core biopsy. Eleven cells from the peripheral blood and nine cells from the bone marrow core biopsy were analyzed. The BCR/ABL1 FISH analysis was performed on the peripheral blood.     Abnormal Karyotype:  47,XX,t(9;22)(q34;q11.2),+der(22)t(9;22)[18]/46,XX[2]    Abnormal FISH:  A BCR/ABL1 interphase FISH assay showed a signal pattern consistent with a BCR::ABL1 rearrangement  and the 9;22 translocation in 88% of the 100 cells scored. The majority of the abnormal cells (64/88) contained an additional BCR/ABL1 fusion signal, while 9/88 abnormal cells contained an additional ABL1 and ASS1 signal.           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  MRD by flow insufficient, p190 2/100,000  12/10/2021: Cycle 2 B cycle + rituximab  12/14/21: IT#4  12/17/21: IT#5  01/10/22: Cycle 3 A cycle + rituximab  01/11/22: IT #6  01/24/22: Bmbx - MRD-neg by PCR (p190)   02/17/22: C4 B cycle + ritux  02/18/22: IT#7  03/21/22: C5 A cycle + ritux (4th dose)  03/22/22: IT#8  04/29/22: Bmbx - MRD-neg by PCR (p190)   05/18/22: restart dasatinib 70mg  s/p neutropenia  05/26/22: Start maintenance vincristine 2mg , Prednisone 200mg  D1-5   06/02/22: IT #9  06/27/22: C2 Maintenance: vincristine 2mg , Prednisone 200mg  D1-5, HOLD dasatinib due to neutropenia  07/04/22: RESTART dasatinib 70mg   07/21/22: IT #10  07/26/22: C3 Maintenance: vincristine 2mg , Prednisone 200mg  D1-5, dasatinib 70 mg  08/08/22: IT #11 - prophylaxis completed  08/23/22: C4 Maintenance: vincristine 2mg , Prednisone 200mg  D1-5, dasatinib 70 mg  09/20/22: C5 Maintenance: vincristine 2mg , Prednisone 200mg  D1-5, dasatinib 70 mg  10/04/22: Bone marrow biopsy - continued remission, MRD and BCR/ABL p190 negative  10/18/22: C6 Maintenance. ANC 0.4, HOLD dasatinib, continue vincristine 2mg  and Prednisone 200mg  D1-5  11/15/22: C7 Maintenance ANC 0.4. HOLD TKI, continue vincristine 2mg  and Prednisone 200mg  D1-5  ~11/30/22: Start ponatinib 15 mg daily  12/13/22: C8 Maintenance ANC 1.2. ponatinib 15mg , vincristine 2mg  D1 and Prednisone 200mg  D1-5  01/10/23: C9 Maintenance Anc 0.7. HOLD ponatinib, continue vincristine 2mg  D1 and Prednisone 200mg  D1-5  01/26/23: ANC 1.7, restart ponatinib 15 mg  02/08/23: C10 Maintenance. ANC is 1.4 - continue ponatinib  03/07/23: C11 Maintenance ANC is 1.1. 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)   10/29/2021 Initial Diagnosis    B-cell acute lymphoblastic leukemia (ALL) (CMS-HCC)     10/30/2021 - 03/30/2022 Chemotherapy    IP/OP LEUKEMIA GRAAPH-2005 < 60 YO (OP PEGFILGRASTIM ON DAY 7)  [No description for this plan]     01/06/2022 - 04/07/2022 Endocrine/Hormone Therapy    OP LEUPROLIDE (LUPRON) 11.25 MG EVERY 3 MONTHS  Plan Provider: [name], MD     05/26/2022 -  Chemotherapy    OP LEUKEMIA VINCRISTINE  vinCRIStine 2 mg IV on day 1     07/26/2022 - 07/26/2022 Endocrine/Hormone Therapy    OP LEUPROLIDE (LUPRON) 11.25 MG EVERY 3 MONTHS  Plan Provider: [name], MD           REVIEW OF SYSTEMS:  A comprehensive review of 10 systems was negative except for pertinent positives noted in HPI.      PHYSICAL EXAM: Video visit  There were no vitals filed for this visit.    GEN: Awake and alert, pleasant appearing female in no acute distress. Hair is growing back and in pig tails  LUNGS: No increased work of breathing on room air, talking in full sentences.  PSYCH: Alert and oriented  to person, place and time. Excited when talking about her projects.  SKIN: No rashes, petechiae or jaundice noted        The patient reports they are physically located in [place] and is currently: at home. I conducted a audio/video visit. I spent  7m 51s on the video call with the patient. I spent an additional 16 minutes on pre- and post-visit activities on the date of service .     Redgie Grayer, MD  [facility]

## 2023-05-01 ENCOUNTER — Telehealth
Admit: 2023-05-01 | Discharge: 2023-05-02 | Payer: PRIVATE HEALTH INSURANCE | Attending: Student in an Organized Health Care Education/Training Program | Primary: Student in an Organized Health Care Education/Training Program

## 2023-05-01 DIAGNOSIS — C9100 Acute lymphoblastic leukemia not having achieved remission: Principal | ICD-10-CM

## 2023-05-01 NOTE — Unmapped (Signed)
[facility]uk[facility] Follow Up Visit Note     Patient Name: MAYSEL MCCOLM  Patient Age: 47 y.o.  Encounter Date: 05/02/2023    Primary Care Provider:  [name], MD    Referring Physician:  [name], MD  [address]  [place],  [place] [address]    Cancer Diagnosis: Ph+ B-ALL; Initial Dx 10/29/2021  Cancer status: CR1, BCR/ABL p190 negative in marrow  Treatment Regimen: First Line, ponatinib + Maintenance (vincristine, prednisone) s/p truncated course of GRAAPH-2005, HyperCVAD  Treatment Goal: Curative  Comorbidities: HTN, anxiety, hemorrhoids with anal fissures  Transplant: referral deferred in CR1    Assessment/Plan:  PURVA VESSELL is a 47 y.o. female with past medical history of chronic right shoulder and back pain, hypertension, anxiety, and recently diagnosed Ph+ ALL. 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  Given her deep molecular remission and her intolerance of hyperCVAD, we are transitioned to maintenance chemotherapy.     She is now s/p Cycle 12 of Maintenance ponatinib + vincristine + prednisone. Interim bmbx demonstrated a continued deep remission. Her cell counts remain stable and she is doing great. Will proceed without changes.    Covid shot today.    Continue monthly IVIG on a separate date due to insurance       Ph+ALL, in remission: On GRAAPH 2005 + dasatinib. Completed 12/12 planned IT treatments for CNS ppx.  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 09/2022 restarted Valcyte with the goal of controlling CMV and limiting neutropenia  - continue letermovir  - d/c CMV levels as of 03/2023 - per Dr. Kari Baars    Hypogammaglobulinemia:   - Monthly IVIG started 07/26/22 - now at [facility] due to insurance coverage    Hemorrhoids, Anal fissure: persistent but stable, per patient  - supportive care at home    Pancytopenia due to chemotherapy  - ANC and Platelets sufficient to proceed without changes 05/02/2023  - supportive care as detailed below when in nadir    Menses suppression and birth control: previously received Lupron with last dose 07/26/22   - Micronor - daily   Will need birth control while on chemo, including when on only TKI     Psychosocial distress: She reports a moderate level of anxiety regarding the management of the above.   - Counseling given   - Consider ref to comprehensive cancer support program     Patient-centered care/Shared decision-making:   We discussed the plan above at length. The patient and her husband actively contributed to the conversation. Specifically, her most important outcomes are:   Prolonging overall survival and Maintaining her overall functional activity     Supportive Care Needs: We recommend based on the patient???s underlying diagnosis and treatment history the following supportive care:    1. Antimicrobial prophylaxis:    Viral - valacyclovir and  letermovir  Bacterial: Levofloxacin when ANC <0.5  Fungal: Fluconazole 200mg  when ANC <0.5  PJP: dapsone 100mg  daily    2. Blood product support:  Leukoreduced blood products are required.  Irradiated blood products are preferred, but in case of urgent transfusion needs non-irradiated blood products may be used: 2 units for Hg <=8.0.     Coordination of care:   - Continue maintenance q4 weeks (next extended 5 weeks due to schedule    [name], AGPCNP-BC  Nurse Practitioner  Hematology/Oncology  [facility]      [name], MD was available  Leukemia Program  Division of Hematology  [facility]      Nurse Navigator (non-clinical trial patients): Elicia Lamp, RN        Tel. [phone]       Fax. [phone]  Toll-free appointments: [phone]  Scheduling assistance: [phone]  After hours/weekends: [phone] (ask for adult hematology/oncology on-call)      History of Present Illness:   SAM WUNSCHEL is a 47 y.o. female with past medical history noted as above who presents for follow up Ph+ ALL.      Her social history is notable for being a Runner, broadcasting/film/video.  She was recently married. She lives with her husband.    Interim history  Since her last visit,    Fundraiser this past weekend went well. She is feeling sore from all of the work prepping for the fundraiser. She raised about $500.    No fevers/chills. Anette Riedel was sick for a few days with URI symptoms but she didn't develop any symptoms..    Numbness/tingling is stable/mild   -Occasional outside of toes  -Thumbs and forefingers.        Oncology History is as below:   Hematology/Oncology History Overview Note   Referring/Local Oncologist:    Diagnosis:   10/29/2021  Bone marrow, left iliac, aspiration and biopsy  -  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 The majority of the abnormal cells (64/88) contained an additional BCR/ABL1 fusion signal, while 9/88 abnormal cells contained an additional ABL1 and ASS1 signal.       Genetics:   Karyotype/FISH:   RESULTS   Date Value Ref Range Status   10/28/2021   Final    NOTE: This report reflects a combined study from a peripheral blood and a bone marrow core biopsy. Eleven cells from the peripheral blood and nine cells from the bone marrow core biopsy were analyzed. The BCR/ABL1 FISH analysis was performed on the peripheral blood.     Abnormal Karyotype:  47,XX,t(9;22)(q34;q11.2),+der(22)t(9;22)[18]/46,XX[2]    Abnormal FISH:  A BCR/ABL1 interphase FISH assay showed a signal pattern consistent with a BCR::ABL1 rearrangement and the 9;22 translocation in 88% of the 100 cells scored. The majority of the abnormal cells (64/88) contained an additional BCR/ABL1 fusion signal, while 9/88 abnormal cells contained an additional ABL1 and ASS1 signal.           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  MRD by flow insufficient, p190 2/100,000  12/10/2021: Cycle 2 B cycle + rituximab  12/14/21: IT#4  12/17/21: IT#5  01/10/22: Cycle 3 A cycle + rituximab  01/11/22: IT #6  01/24/22: Bmbx - MRD-neg by PCR (p190)   02/17/22: C4 B cycle + ritux  02/18/22: IT#7  03/21/22: C5 A cycle + ritux (4th dose)  03/22/22: IT#8  04/29/22: Bmbx - MRD-neg by PCR (p190)   05/18/22: restart dasatinib 70mg  s/p neutropenia  05/26/22: Start maintenance vincristine 2mg , Prednisone 200mg  D1-5   06/02/22: IT #9  06/27/22: C2 Maintenance: vincristine 2mg , Prednisone 200mg  D1-5, HOLD dasatinib due to neutropenia  07/04/22: RESTART dasatinib 70mg   07/21/22: IT #10  07/26/22: C3 Maintenance: vincristine 2mg , Prednisone 200mg  D1-5, dasatinib 70 mg  08/08/22: IT #11 - prophylaxis completed  08/23/22: C4 Maintenance: vincristine 2mg , Prednisone 200mg  D1-5, dasatinib 70 mg  09/20/22: C5 Maintenance: vincristine 2mg , Prednisone 200mg  D1-5, dasatinib 70 mg  10/04/22: Bone marrow biopsy - continued remission, MRD and BCR/ABL p190 negative  10/18/22: C6 Maintenance. ANC 0.4, HOLD dasatinib, continue vincristine 2mg  and Prednisone 200mg  D1-5  11/15/22: C7 Maintenance ANC 0.4. HOLD TKI, continue vincristine 2mg  and Prednisone 200mg  D1-5  ~11/30/22: Start ponatinib 15 mg daily  12/13/22: C8 Maintenance ANC 1.2. ponatinib 15mg , vincristine 2mg  D1 and Prednisone 200mg  D1-5  01/10/23: C9 Maintenance Anc 0.7. HOLD ponatinib, continue vincristine 2mg  D1 and Prednisone 200mg  D1-5  01/26/23: ANC 1.7, restart ponatinib 15 mg  02/08/23: C10 Maintenance. ANC is 1.4 - continue ponatinib  03/07/23: C11 Maintenance ANC is 1.1. continue ponatinib  04/06/23: C12 Maintenance  04/12/23: Bmbx - continued remission, MRD-negative, BCR/ABL undetectable         Review of Systems:   ROS reviewed and negative except as noted in H and P     Allergies:  Allergies   Allergen Reactions    Erythromycin Hives     Other reaction(s): Not available    Other      Pt cant take ibuprofen due to condition.    Sulfa (Sulfonamide Antibiotics) Anaphylaxis     Other reaction(s): Not available    Azithromycin        Medications:     Current Outpatient Medications:     acetaminophen (TYLENOL 8 HOUR) 650 MG CR tablet, Take 2 tablets (1,300 mg total) by mouth every eight (8) hours as needed for pain., Disp: , Rfl:     albuterol HFA 90 mcg/actuation inhaler, Inhale 2 puffs every six (6) hours as needed for wheezing., Disp: , Rfl:     aspirin (ECOTRIN) 81 MG tablet, Take 1 tablet (81 mg total) by mouth daily., Disp: , Rfl:     cetirizine (ZYRTEC) 10 MG tablet, Take 1 tablet (10 mg total) by mouth nightly., Disp: 30 tablet, Rfl: 2    dapsone 100 MG tablet, TAKE 1 TABLET(100 MG) BY MOUTH DAILY, Disp: 30 tablet, Rfl: 11    norethindrone (MICRONOR) 0.35 mg tablet, Take 1 tablet by mouth daily., Disp: 84 tablet, Rfl: 3    PONATinib (ICLUSIG) 15 mg tablet, Take 1 tablet (15 mg total) by mouth daily. Swallow tablets whole. Do not crush, break, cut or chew tablets., Disp: 30 tablet, Rfl: 5    predniSONE (DELTASONE) 50 MG tablet, Take 4 tablets (200 mg total) by mouth once daily on days 1-5 of each cycle., Disp: 20 tablet, Rfl: 5  rosuvastatin (CRESTOR) 10 MG tablet, Take 1 tablet (10 mg total) by mouth nightly., Disp: 90 tablet, Rfl: 3    tizanidine (ZANAFLEX) 4 MG tablet, Take 1 tablet (4 mg total) by mouth nightly., Disp: 90 tablet, Rfl: 2    valACYclovir (VALTREX) 500 MG tablet, Take 1 tablet (500 mg total) by mouth daily., Disp: 30 tablet, Rfl: 12    escitalopram oxalate (LEXAPRO) 10 MG tablet, Take 1 tablet (10 mg total) by mouth daily., Disp: 90 tablet, Rfl: 0    letermovir (PREVYMIS) 480 mg tablet, Take 1 tablet (480 mg total) by mouth daily., Disp: 28 tablet, Rfl: 5    levalbuterol (XOPENEX) 0.31 mg/3 mL nebulizer solution, Inhale 3 mL (0.31 mg total) by nebulization every four (4) hours as needed for wheezing (cough)., Disp: 45 mL, Rfl: 0    losartan (COZAAR) 50 MG tablet, Take 1 tablet (50 mg total) by mouth daily., Disp: 30 tablet, Rfl: 3    Current Facility-Administered Medications:     Pfizer COVID-19 Vaccine (12 yr+) (2024-2025) pre-filled syringe [facility], , , ,     Facility-Administered Medications Ordered in Other Visits:     dexAMETHasone (DECADRON) 4 mg/mL injection 20 mg, 20 mg, Intravenous, Once PRN, Dellis Anes, Ursula Alert, AGNP    diphenhydrAMINE (BENADRYL) injection 25 mg, 25 mg, Intravenous, Once PRN, Lenon Ahmadi, AGNP    EPINEPHrine [facility]) injection 0.3 mg, 0.3 mg, Intramuscular, Once PRN, Lenon Ahmadi, AGNP    famotidine (PF) (PEPCID) injection 20 mg, 20 mg, Intravenous, Once PRN, Lenon Ahmadi, AGNP    heparin, porcine (PF) 100 unit/mL injection 500 Units, 500 Units, Intravenous, Q30 Min PRN, [name], MD, 500 Units at 05/02/23 1203    methylPREDNISolone sodium succinate (PF) (SOLU-Medrol) injection 125 mg, 125 mg, Intravenous, Once PRN, Dellis Anes, Ursula Alert, AGNP    OKAY TO SEND MEDICATION/CHEMOTHERAPY TO OUTPATIENT UNIT, , Other, Once, Lenon Ahmadi, AGNP    prochlorperazine (COMPAZINE) injection 10 mg, 10 mg, Intravenous, Q6H PRN, Lenon Ahmadi, AGNP    prochlorperazine (COMPAZINE) tablet 10 mg, 10 mg, Oral, Q6H PRN, Lenon Ahmadi, AGNP    sodium chloride (NS) 0.9 % infusion, 100 mL/hr, Intravenous, Continuous, Lenon Ahmadi, AGNP    sodium chloride (NS) 0.9 % infusion, 20 mL/hr, Intravenous, Continuous PRN, Lenon Ahmadi, AGNP    sodium chloride 0.9% (NS) bolus 1,000 mL, 1,000 mL, Intravenous, Once PRN, Lenon Ahmadi, AGNP    Medical History:  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    EBV infection        Social History:  Social History     Social History Narrative    Not on file       Family History:  No family history on file.    Objective:   Pulse 80  - Temp 37 ??C (98.6 ??F) (Temporal)  - Resp 18  - Wt 87.7 kg (193 lb 5.5 oz)  - SpO2 96%  - BMI 30.27 kg/m??     Physical Exam:  GENERAL: well-appearing  woman, ab husband   HEART: Normal color, not excessive pallor.  CHEST/LUNG: Normal work of breathing. No dyspnea with conversation.   ABDOMEN: No obvious distention.    EXTREMITIES: No edema, cyanosis or clubbing.   SKIN: no rashes noted  NEURO EXAM: Grossly intact.       Test Results:  05/02/2023 CBC/d and CMP reviewed with patient

## 2023-05-02 ENCOUNTER — Other Ambulatory Visit: Admit: 2023-05-02 | Discharge: 2023-05-03 | Payer: PRIVATE HEALTH INSURANCE

## 2023-05-02 ENCOUNTER — Ambulatory Visit
Admit: 2023-05-02 | Discharge: 2023-05-03 | Payer: PRIVATE HEALTH INSURANCE | Attending: Adult Health | Primary: Adult Health

## 2023-05-02 ENCOUNTER — Ambulatory Visit: Admit: 2023-05-02 | Discharge: 2023-05-03 | Payer: PRIVATE HEALTH INSURANCE

## 2023-05-02 ENCOUNTER — Institutional Professional Consult (permissible substitution): Admit: 2023-05-02 | Discharge: 2023-05-03 | Payer: PRIVATE HEALTH INSURANCE

## 2023-05-02 DIAGNOSIS — D801 Nonfamilial hypogammaglobulinemia: Principal | ICD-10-CM

## 2023-05-02 DIAGNOSIS — B259 Cytomegaloviral disease, unspecified: Principal | ICD-10-CM

## 2023-05-02 DIAGNOSIS — C9100 Acute lymphoblastic leukemia not having achieved remission: Principal | ICD-10-CM

## 2023-05-02 LAB — COMPREHENSIVE METABOLIC PANEL
ALBUMIN: 4.1 g/dL (ref 3.4–5.0)
ALKALINE PHOSPHATASE: 59 U/L (ref 46–116)
ALT (SGPT): 30 U/L (ref 10–49)
ANION GAP: 6 mmol/L (ref 5–14)
AST (SGOT): 29 U/L (ref <=34)
BILIRUBIN TOTAL: 0.5 mg/dL (ref 0.3–1.2)
BLOOD UREA NITROGEN: 16 mg/dL (ref 9–23)
BUN / CREAT RATIO: 24
CALCIUM: 9.7 mg/dL (ref 8.7–10.4)
CHLORIDE: 108 mmol/L — ABNORMAL HIGH (ref 98–107)
CO2: 27 mmol/L (ref 20.0–31.0)
CREATININE: 0.67 mg/dL
EGFR CKD-EPI (2021) FEMALE: >90 mL/min/{1.73_m2} (ref >=60)
GLUCOSE RANDOM: 170 mg/dL (ref 70–179)
POTASSIUM: 3.9 mmol/L (ref 3.4–4.8)
PROTEIN TOTAL: 6.8 g/dL (ref 5.7–8.2)
SODIUM: 141 mmol/L (ref 135–145)

## 2023-05-02 LAB — CBC W/ AUTO DIFF
BASOPHILS ABSOLUTE COUNT: 0 10*9/L (ref 0.0–0.1)
BASOPHILS RELATIVE PERCENT: 0.4 %
EOSINOPHILS ABSOLUTE COUNT: 0.1 10*9/L (ref 0.0–0.5)
EOSINOPHILS RELATIVE PERCENT: 1.6 %
HEMATOCRIT: 33.3 % — ABNORMAL LOW (ref 34.0–44.0)
HEMOGLOBIN: 11.5 g/dL (ref 11.3–14.9)
LYMPHOCYTES ABSOLUTE COUNT: 1.3 10*9/L (ref 1.1–3.6)
LYMPHOCYTES RELATIVE PERCENT: 39.3 %
MEAN CORPUSCULAR HEMOGLOBIN CONC: 34.5 g/dL (ref 32.0–36.0)
MEAN CORPUSCULAR HEMOGLOBIN: 33.2 pg — ABNORMAL HIGH (ref 25.9–32.4)
MEAN CORPUSCULAR VOLUME: 96 fL — ABNORMAL HIGH (ref 77.6–95.7)
MEAN PLATELET VOLUME: 9.1 fL (ref 6.8–10.7)
MONOCYTES ABSOLUTE COUNT: 0.4 10*9/L (ref 0.3–0.8)
MONOCYTES RELATIVE PERCENT: 11.5 %
NEUTROPHILS ABSOLUTE COUNT: 1.6 10*9/L — ABNORMAL LOW (ref 1.8–7.8)
NEUTROPHILS RELATIVE PERCENT: 47.2 %
PLATELET COUNT: 155 10*9/L (ref 150–450)
RED BLOOD CELL COUNT: 3.47 10*12/L — ABNORMAL LOW (ref 3.95–5.13)
RED CELL DISTRIBUTION WIDTH: 16.8 % — ABNORMAL HIGH (ref 12.2–15.2)
WBC ADJUSTED: 3.4 10*9/L — ABNORMAL LOW (ref 3.6–11.2)

## 2023-05-02 MED ORDER — ESCITALOPRAM 10 MG TABLET
ORAL_TABLET | Freq: Every day | ORAL | 0 refills | 90 days | Status: CP
Start: 2023-05-02 — End: ?
  Filled 2023-05-02: qty 90, 90d supply, fill #0

## 2023-05-02 MED ORDER — LETERMOVIR 480 MG TABLET
ORAL_TABLET | Freq: Every day | ORAL | 5 refills | 28 days | Status: CP
Start: 2023-05-02 — End: ?
  Filled 2023-05-02: qty 28, 28d supply, fill #0

## 2023-05-02 MED ORDER — LOSARTAN 50 MG TABLET
ORAL | 3 refills | 30 days | Status: CP
Start: 2023-05-02 — End: 2024-05-01
  Filled 2023-05-02: qty 30, 30d supply, fill #0

## 2023-05-02 MED ADMIN — heparin, porcine (PF) 100 unit/mL injection 500 Units: 500 [IU] | INTRAVENOUS | @ 16:00:00 | Stop: 2023-05-03

## 2023-05-02 MED ADMIN — vinCRIStine (ONCOVIN) 2 mg in sodium chloride (NS) 0.9 % 25 mL IVPB: 2 mg | INTRAVENOUS | @ 16:00:00 | Stop: 2023-05-02

## 2023-05-02 MED FILL — CETIRIZINE 10 MG TABLET: ORAL | 30 days supply | Qty: 30 | Fill #1

## 2023-05-02 MED FILL — PREDNISONE 50 MG TABLET: 28 days supply | Qty: 20 | Fill #1

## 2023-05-02 NOTE — Unmapped (Signed)
Pt oriented to room and call bell. All procedures and care while in clinic explained and reviewed prior to implementation.  Pt encouraged to voice fears and concerns. Patient denies any questions or concerns at this time. Will continue educating as needed throughout patient's visit.   Time spent 5 minutes.

## 2023-05-02 NOTE — Unmapped (Signed)
RED ZONE Means: RED ZONE: Take action now!     You need to be seen right away  Symptoms are at a severe level of discomfort    Call 911 or go to your nearest  Hospital for help     - Bleeding that will not stop    - Hard to breathe    - New seizure - Chest pain  - Fall or passing out  -Thoughts of hurting    yourself or others      Call 911 if you are going into the RED ZONE                  YELLOW ZONE Means:     Please call with any new or worsening symptom(s), even if not on this list.  Call [phone]  After hours, weekends, and holidays - you will reach a long recording with specific instructions, If not in an emergency such as above, please listen closely all the way to the end and choose the option that relates to your need.   You can be seen by a provider the same day through our Same Day Acute Care for Patients with Cancer program.      YELLOW ZONE: Take action today     Symptoms are new or worsening  You are not within your goal range for:    - Pain    - Shortness of breath    - Bleeding (nose, urine, stool, wound)    - Feeling sick to your stomach and throwing up    - Mouth sores/pain in your mouth or throat    - Hard stool or very loose stools (increase in       ostomy output)    - No urine for 12 hours    - Feeding tube or other catheter/tube issue    - Redness or pain at previous IV or port/catheter site    - Depressed or anxiety   - Swelling (leg, arm, abdomen,     face, neck)  - Skin rash or skin changes  - Wound issues (redness, drainage,    re-opened)  - Confusion  - Vision changes  - Fever >100.4 F or chills  - Worsening cough with mucus that is    green, yellow, or bloody  - Pain or burning when going to the    bathroom  - Home Infusion Pump Issue- call    [phone]         Call your healthcare provider if you are going into the YELLOW ZONE     GREEN ZONE Means:  Your symptoms are under controls  Continue to take your medicine as ordered  Keep all visits to the provider GREEN ZONE: You are in control  No increase or worsening symptoms  Able to take your medicine  Able to drink and eat    - DO NOT use MyChart messages to report red or yellow symptoms. Allow up to 3    business days for a reply.  -MyChart is for non-urgent medication refills, scheduling requests, or other general questions.         [id number] Rev. 01/28/2022  Approved by Oncology Patient Education Committee         Lab on 05/02/2023   Component Date Value Ref Range Status    Sodium 05/02/2023 141  135 - 145 mmol/L Final    Potassium 05/02/2023 3.9  3.4 - 4.8 mmol/L Final    Chloride 05/02/2023 108 (H)  98 - 107 mmol/L Final    CO2 05/02/2023 27.0  20.0 - 31.0 mmol/L Final    Anion Gap 05/02/2023 6  5 - 14 mmol/L Final    BUN 05/02/2023 16  9 - 23 mg/dL Final    Creatinine 09/81/1914 0.67  0.55 - 1.02 mg/dL Final    BUN/Creatinine Ratio 05/02/2023 24   Final    eGFR CKD-EPI (2021) Female 05/02/2023 >90  >=60 mL/min/1.65m2 Final    eGFR calculated with CKD-EPI 2021 equation in accordance with [employer] and [employer] of Nephrology Task Force recommendations.    Glucose 05/02/2023 170  70 - 179 mg/dL Final    Calcium 78/29/5621 9.7  8.7 - 10.4 mg/dL Final    Albumin 30/86/5784 4.1  3.4 - 5.0 g/dL Final    Total Protein 05/02/2023 6.8  5.7 - 8.2 g/dL Final    Total Bilirubin 05/02/2023 0.5  0.3 - 1.2 mg/dL Final    AST 69/62/9528 29  <=34 U/L Final    ALT 05/02/2023 30  10 - 49 U/L Final    Alkaline Phosphatase 05/02/2023 59  46 - 116 U/L Final    WBC 05/02/2023 3.4 (L)  3.6 - 11.2 10*9/L Final    RBC 05/02/2023 3.47 (L)  3.95 - 5.13 10*12/L Final    HGB 05/02/2023 11.5  11.3 - 14.9 g/dL Final    HCT 41/32/4401 33.3 (L)  34.0 - 44.0 % Final    MCV 05/02/2023 96.0 (H)  77.6 - 95.7 fL Final    MCH 05/02/2023 33.2 (H)  25.9 - 32.4 pg Final    MCHC 05/02/2023 34.5  32.0 - 36.0 g/dL Final    RDW 02/72/5366 16.8 (H)  12.2 - 15.2 % Final    MPV 05/02/2023 9.1  6.8 - 10.7 fL Final    Platelet 05/02/2023 155  150 - 450 10*9/L Final    Neutrophils % 05/02/2023 47.2  % Final    Lymphocytes % 05/02/2023 39.3  % Final    Monocytes % 05/02/2023 11.5  % Final    Eosinophils % 05/02/2023 1.6  % Final    Basophils % 05/02/2023 0.4  % Final    Absolute Neutrophils 05/02/2023 1.6 (L)  1.8 - 7.8 10*9/L Final    Absolute Lymphocytes 05/02/2023 1.3  1.1 - 3.6 10*9/L Final    Absolute Monocytes 05/02/2023 0.4  0.3 - 0.8 10*9/L Final    Absolute Eosinophils 05/02/2023 0.1  0.0 - 0.5 10*9/L Final    Absolute Basophils 05/02/2023 0.0  0.0 - 0.1 10*9/L Final    Anisocytosis 05/02/2023 Slight (A)  Not Present Final

## 2023-05-02 NOTE — Unmapped (Signed)
Patient received COVID vaccine as ordered by MD

## 2023-05-02 NOTE — Unmapped (Signed)
You are doing well! The bone marrow biopsy demonstrates a deep remission with BCR/ABL at undetectable levels.    Proceed with the next cycle of therapy:  - IV vincristine today  - 5 days of prednisone  - continue ponatinib and aspirin daily    Covid shot today

## 2023-05-02 NOTE — Unmapped (Signed)
Chief concern/reason for visit:  Client here for administration of Vincristine. Client has port positive for blood return. Client completed all infusions without complications. Client discharged home in stable condition.     Assessment/Plan:   Client receiving treatment for the following diagnosis:  1. 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

## 2023-05-02 NOTE — Unmapped (Signed)
Order parameters reviewed by this RN, parameters met and OK to give medication as ordered. Medication administered by medical assistant.

## 2023-05-05 NOTE — Unmapped (Signed)
[facility] Refill Coordination Note    Patricia [name], DOB: 1975-11-23  Phone: [phone] (home)       All above HIPAA information was verified with patient.     PT confirmed she wants Iclusig not Imatinib        05/03/2023     6:40 PM   Specialty Rx Medication Refill Questionnaire   Which Medications would you like refilled and shipped? Imatinib   Please list all current allergies: Still thosenon my chart   Have you missed any doses in the last 30 days? No   Have you had any changes to your medication(s) since your last refill? No   How many days remaining of each medication do you have at home? 7   Have you experienced any side effects in the last 30 days? No   Please enter the full address (street address, city, state, zip code) where you would like your medication(s) to be delivered to. [address], [place] [address]   Please specify on which day you would like your medication(s) to arrive. Note: if you need your medication(s) within 3 days, please call the pharmacy to schedule your order at [phone]  05/08/2023   Has your insurance changed since your last refill? No   Would you like a pharmacist to call you to discuss your medication(s)? No   Do you require a signature for your package? (Note: if we are billing Medicare Part B or your order contains a controlled substance, we will require a signature) No         Completed refill call assessment today to schedule patient's medication shipment from the [facility] [phone]).  All relevant notes have been reviewed.       Confirmed patient received a Conservation officer, historic buildings and a Surveyor, mining with first shipment. The patient will receive a drug information handout for each medication shipped and additional FDA Medication Guides as required.         REFERRAL TO PHARMACIST     Referral to the pharmacist: Not needed      [facility]     Shipping address confirmed in Epic.     Delivery Scheduled: Yes, Expected medication delivery date: 05/08/23.     Medication will be delivered via Same Day Courier to the prescription address in Epic [place].    Caliah Kopke [name]   [facility] Specialty Technician

## 2023-05-08 MED FILL — ICLUSIG 15 MG TABLET: ORAL | 30 days supply | Qty: 30 | Fill #5

## 2023-05-10 ENCOUNTER — Ambulatory Visit: Admit: 2023-05-10 | Discharge: 2023-05-11 | Payer: PRIVATE HEALTH INSURANCE

## 2023-05-10 MED ADMIN — dextrose 5 % infusion: 30 mL/h | INTRAVENOUS | @ 15:00:00 | Stop: 2023-05-10

## 2023-05-10 MED ADMIN — immun glob G(IgG)-pro-IgA 0-50 (PRIVIGEN) 10 % intravenous solution 30 g: .4 g/kg | INTRAVENOUS | @ 15:00:00 | Stop: 2023-05-10

## 2023-05-10 MED ADMIN — acetaminophen (TYLENOL) tablet 650 mg: 650 mg | ORAL | @ 15:00:00 | Stop: 2023-05-10

## 2023-05-10 MED ADMIN — heparin, porcine (PF) 100 unit/mL injection 500 Units: 500 [IU] | INTRAVENOUS | @ 17:00:00 | Stop: 2023-05-10

## 2023-05-10 MED ADMIN — diphenhydrAMINE (BENADRYL) capsule/tablet 25 mg: 25 mg | ORAL | @ 15:00:00 | Stop: 2023-05-10

## 2023-05-11 NOTE — Unmapped (Signed)
Pt presents for IVIG.  VSS, pt weight is 88 kg.  IV placed, premeds administered.  Pt aware of potential reaction/side effects, call bell within reach.    1119  IVIG 30 g started, to infuse at the following rates:    26 ml/hr for 15 min  88 ml/hr for 30 min  176 ml/hr for 30 min  422 ml/hr until complete.    1303 IVIG complete.  Pt tolerated without complication, VSS.  IV flushed per policy and d/c'd, gauze and coban applied.  Pt left clinic in no acute distress.

## 2023-05-29 DIAGNOSIS — C9100 Acute lymphoblastic leukemia not having achieved remission: Principal | ICD-10-CM

## 2023-05-29 MED ORDER — ICLUSIG 15 MG TABLET
ORAL_TABLET | Freq: Every day | ORAL | 5 refills | 30 days
Start: 2023-05-29 — End: ?

## 2023-05-29 NOTE — Unmapped (Signed)
[facility] Clinical Assessment & Refill Coordination Note    Patricia [name], DOB: Dec 21, 1975  Phone: [phone] (home)     All above HIPAA information was verified with patient.     Was a Nurse, learning disability used for this call? No    Specialty Medication(s):   Hematology/Oncology: Iclusig     Current Outpatient Medications   Medication Sig Dispense Refill    acetaminophen (TYLENOL 8 HOUR) 650 MG CR tablet Take 2 tablets (1,300 mg total) by mouth every eight (8) hours as needed for pain.      albuterol HFA 90 mcg/actuation inhaler Inhale 2 puffs every six (6) hours as needed for wheezing.      aspirin (ECOTRIN) 81 MG tablet Take 1 tablet (81 mg total) by mouth daily.      cetirizine (ZYRTEC) 10 MG tablet Take 1 tablet (10 mg total) by mouth nightly. 30 tablet 2    dapsone 100 MG tablet TAKE 1 TABLET(100 MG) BY MOUTH DAILY 30 tablet 11    escitalopram oxalate (LEXAPRO) 10 MG tablet Take 1 tablet (10 mg total) by mouth daily. 90 tablet 0    letermovir (PREVYMIS) 480 mg tablet Take 1 tablet (480 mg total) by mouth daily. 28 tablet 5    levalbuterol (XOPENEX) 0.31 mg/3 mL nebulizer solution Inhale 3 mL (0.31 mg total) by nebulization every four (4) hours as needed for wheezing (cough). 45 mL 0    losartan (COZAAR) 50 MG tablet Take 1 tablet (50 mg total) by mouth daily. 30 tablet 3    norethindrone (MICRONOR) 0.35 mg tablet Take 1 tablet by mouth daily. 84 tablet 3    PONATinib (ICLUSIG) 15 mg tablet Take 1 tablet (15 mg total) by mouth daily. Swallow tablets whole. Do not crush, break, cut or chew tablets. 30 tablet 5    predniSONE (DELTASONE) 50 MG tablet Take 4 tablets (200 mg total) by mouth once daily on days 1-5 of each cycle. 20 tablet 5    rosuvastatin (CRESTOR) 10 MG tablet Take 1 tablet (10 mg total) by mouth nightly. 90 tablet 3    tizanidine (ZANAFLEX) 4 MG tablet Take 1 tablet (4 mg total) by mouth nightly. 90 tablet 2    valACYclovir (VALTREX) 500 MG tablet Take 1 tablet (500 mg total) by mouth daily. 30 tablet 12     No current facility-administered medications for this visit.        Changes to medications: Patricia [name] reports no changes at this time.    Allergies   Allergen Reactions    Erythromycin Hives     Other reaction(s): Not available    Other      Pt cant take ibuprofen due to condition.    Sulfa (Sulfonamide Antibiotics) Anaphylaxis     Other reaction(s): Not available    Azithromycin        Changes to allergies: No    SPECIALTY MEDICATION ADHERENCE     Iclusig 15 mg: ~14 days of medicine on hand     Are there any concerns with adherence? No    Adherence counseling provided? Not needed    Patient-Reported Symptoms Tracker for Cancer Patients on Oral Chemotherapy     Oral chemotherapy medication name(s): Iclusig  Dose and frequency: 15 mg once daily  Oral Chemotherapy Start Date:    Baseline? No  Clinic(s) visited: Hematology    Symptom Grouping Question Patient Response   Digestion and Eating Have you felt sick to your stomach? Denies  Had diarrhea? Denies    Constipated? Denies    Not wanting to eat? Denies    Comments      Sleep and Pain Felt very tired even after you rest? Denies    Pain due to cancer medication or cancer? Denies    Comments     Other Side Effects Numbness or tingling in hands and/or feet? Denies    Felt short of breath? Denies    Mouth or throat Sores? Denies    Rash? Denies    Palmar-plantar erythrodysesthesia syndrome?      Rash - acneiform?      Rash - maculo-papular?      How many days over the past month did your cancer medication or cancer keep you from your normal activities?  Write in number of days, 0-30:       Other side effects or things you would like to discuss?      Comments?     Adherence  In the last 30 days, on how many days did you miss at least one dose of any of your [drug name]? Write in number of days, 0-30:       What reasons are you having trouble taking your medication [pharmacist: check all that apply]? Specify chemotherapy cycle: Comments:        Comments       Optional Symptom Tracking Comments:      CLINICAL MANAGEMENT AND INTERVENTION      Clinical Benefit Assessment:    Do you feel the medicine is effective or helping your condition? Yes    Clinical Benefit counseling provided? Not needed    Acute Infection Status:    Acute infections noted within Epic:  No active infections    Patient reported infection: None    Therapy Appropriateness:    Is therapy appropriate based on current medication list, adverse reactions, adherence, clinical benefit and progress toward achieving therapeutic goals?  Yes, therapy is appropriate and should be continued    DISEASE/MEDICATION-SPECIFIC INFORMATION      N/A    Is the patient receiving adequate infection prevention treatment? Not applicable    Does the patient have adequate nutritional support? Not applicable    PATIENT SPECIFIC NEEDS     Does the patient have any physical, cognitive, or cultural barriers? No    Is the patient high risk? No    Did the patient require a clinical intervention? No    Does the patient require physician intervention or other additional services (i.e., nutrition, smoking cessation, social work)? No    SOCIAL DETERMINANTS OF HEALTH     At the [facility], we have learned that life circumstances - like trouble affording food, housing, utilities, or transportation can affect the health of many of our patients.   That is why we wanted to ask: are you currently experiencing any life circumstances that are negatively impacting your health and/or quality of life? Patient declined to answer    Social Determinants of Health     Food Insecurity: No Food Insecurity (11/02/2021)    Hunger Vital Sign     Worried About Running Out of Food in the Last Year: Never true     Ran Out of Food in the Last Year: Never true   Internet Connectivity: Not on file   Housing/Utilities: Low Risk  (11/02/2021)    Housing/Utilities     Within the past 12 months, have you ever stayed: outside, in a car, in a tent, in an  overnight shelter, or temporarily in someone else's home (i.e. couch-surfing)?: No     Are you worried about losing your housing?: No     Within the past 12 months, have you been unable to get utilities (heat, electricity) when it was really needed?: No   Tobacco Use: Medium Risk (05/10/2023)    Patient History     Smoking Tobacco Use: Former     Smokeless Tobacco Use: Never     Passive Exposure: Not on file   Transportation Needs: No Transportation Needs (11/02/2021)    PRAPARE - Therapist, art (Medical): No     Lack of Transportation (Non-Medical): No   Alcohol Use: Unknown (09/10/2019)    Received from [facility] visits prior to 10/01/2022., [facility] visits prior to 10/01/2022.    AUDIT-C     Frequency of Alcohol Consumption: Monthly or less     Average Number of Drinks: Not asked     Frequency of Binge Drinking: Not asked   Interpersonal Safety: Unknown (05/29/2023)    Interpersonal Safety     Unsafe Where You Currently Live: Not on file     Physically Hurt by Anyone: Not on file     Abused by Anyone: Not on file   Physical Activity: Not on file   Intimate Partner Violence: Not on file   Stress: Not on file   Substance Use: Not on file   Social Connections: Not on file   Financial Resource Strain: Low Risk  (11/02/2021)    Overall Financial Resource Strain (CARDIA)     Difficulty of Paying Living Expenses: Not hard at all   Depression: Not at risk (11/15/2022)    PHQ-2     PHQ-2 Score: 0   Health Literacy: Not on file       Would you be willing to receive help with any of the needs that you have identified today? Not applicable       SHIPPING     Specialty Medication(s) to be Shipped:   Hematology/Oncology: Iclusig    Other medication(s) to be shipped: No additional medications requested for fill at this time     Changes to insurance: No    Delivery Scheduled: Yes, Expected medication delivery date: 06/08/23.  However, Rx request for refills was sent to the provider as there are none remaining.     Medication will be delivered via Next Day Courier to the confirmed prescription address in [facility].    The patient will receive a drug information handout for each medication shipped and additional FDA Medication Guides as required.  Verified that patient has previously received a Conservation officer, historic buildings and a Surveyor, mining.    The patient or caregiver noted above participated in the development of this care plan and knows that they can request review of or adjustments to the care plan at any time.      All of the patient's questions and concerns have been addressed.    Kermit Balo, [facility]   [facility]  Pharmacist

## 2023-05-30 ENCOUNTER — Other Ambulatory Visit: Admit: 2023-05-30 | Discharge: 2023-05-30 | Payer: PRIVATE HEALTH INSURANCE

## 2023-05-30 ENCOUNTER — Ambulatory Visit: Admit: 2023-05-30 | Discharge: 2023-05-30 | Payer: PRIVATE HEALTH INSURANCE

## 2023-05-30 DIAGNOSIS — C9100 Acute lymphoblastic leukemia not having achieved remission: Principal | ICD-10-CM

## 2023-05-30 LAB — COMPREHENSIVE METABOLIC PANEL
ALBUMIN: 4.1 g/dL (ref 3.4–5.0)
ALKALINE PHOSPHATASE: 60 U/L (ref 46–116)
ALT (SGPT): 33 U/L (ref 10–49)
ANION GAP: 10 mmol/L (ref 5–14)
AST (SGOT): 29 U/L (ref <=34)
BILIRUBIN TOTAL: 0.4 mg/dL (ref 0.3–1.2)
BLOOD UREA NITROGEN: 15 mg/dL (ref 9–23)
BUN / CREAT RATIO: 24
CALCIUM: 9.2 mg/dL (ref 8.7–10.4)
CHLORIDE: 108 mmol/L — ABNORMAL HIGH (ref 98–107)
CO2: 24 mmol/L (ref 20.0–31.0)
CREATININE: 0.62 mg/dL
EGFR CKD-EPI (2021) FEMALE: >90 mL/min/{1.73_m2} (ref >=60)
GLUCOSE RANDOM: 159 mg/dL (ref 70–179)
POTASSIUM: 4 mmol/L (ref 3.4–4.8)
PROTEIN TOTAL: 7.2 g/dL (ref 5.7–8.2)
SODIUM: 142 mmol/L (ref 135–145)

## 2023-05-30 LAB — CBC W/ AUTO DIFF
BASOPHILS ABSOLUTE COUNT: 0 10*9/L (ref 0.0–0.1)
BASOPHILS RELATIVE PERCENT: 0.8 %
EOSINOPHILS ABSOLUTE COUNT: 0.1 10*9/L (ref 0.0–0.5)
EOSINOPHILS RELATIVE PERCENT: 2.5 %
HEMATOCRIT: 33.5 % — ABNORMAL LOW (ref 34.0–44.0)
HEMOGLOBIN: 11.6 g/dL (ref 11.3–14.9)
LYMPHOCYTES ABSOLUTE COUNT: 1.4 10*9/L (ref 1.1–3.6)
LYMPHOCYTES RELATIVE PERCENT: 33.1 %
MEAN CORPUSCULAR HEMOGLOBIN CONC: 34.7 g/dL (ref 32.0–36.0)
MEAN CORPUSCULAR HEMOGLOBIN: 33.3 pg — ABNORMAL HIGH (ref 25.9–32.4)
MEAN CORPUSCULAR VOLUME: 96 fL — ABNORMAL HIGH (ref 77.6–95.7)
MEAN PLATELET VOLUME: 9.1 fL (ref 6.8–10.7)
MONOCYTES ABSOLUTE COUNT: 0.5 10*9/L (ref 0.3–0.8)
MONOCYTES RELATIVE PERCENT: 11 %
NEUTROPHILS ABSOLUTE COUNT: 2.3 10*9/L (ref 1.8–7.8)
NEUTROPHILS RELATIVE PERCENT: 52.6 %
PLATELET COUNT: 163 10*9/L (ref 150–450)
RED BLOOD CELL COUNT: 3.49 10*12/L — ABNORMAL LOW (ref 3.95–5.13)
RED CELL DISTRIBUTION WIDTH: 17.2 % — ABNORMAL HIGH (ref 12.2–15.2)
WBC ADJUSTED: 4.3 10*9/L (ref 3.6–11.2)

## 2023-05-30 MED ORDER — PONATINIB 15 MG TABLET
ORAL_TABLET | Freq: Every day | ORAL | 5 refills | 30 days | Status: CP
Start: 2023-05-30 — End: ?
  Filled 2023-06-07: qty 30, 30d supply, fill #0

## 2023-05-30 MED ADMIN — vinCRIStine (ONCOVIN) 2 mg in sodium chloride (NS) 0.9 % 25 mL IVPB: 2 mg | INTRAVENOUS | @ 16:00:00 | Stop: 2023-05-30

## 2023-05-30 MED ADMIN — heparin, porcine (PF) 100 unit/mL injection 500 Units: 500 [IU] | INTRAVENOUS | @ 16:00:00 | Stop: 2023-05-31

## 2023-05-30 MED FILL — PREVYMIS 480 MG TABLET: ORAL | 28 days supply | Qty: 28 | Fill #1

## 2023-05-30 MED FILL — CETIRIZINE 10 MG TABLET: ORAL | 30 days supply | Qty: 30 | Fill #2

## 2023-05-30 MED FILL — PREDNISONE 50 MG TABLET: 28 days supply | Qty: 20 | Fill #2

## 2023-05-30 MED FILL — LOSARTAN 50 MG TABLET: ORAL | 30 days supply | Qty: 30 | Fill #1

## 2023-05-30 NOTE — Unmapped (Signed)
Patient came in for her scheduled chemotherapy which was tolerated well.  Labs were reviewed.  Port was already accessed; flushes well with + blood return pre infusion and post infusion then deaccessed.    AVS was declined; went home ambulatory.

## 2023-05-30 NOTE — Unmapped (Signed)
Lab on 05/30/2023   Component Date Value Ref Range Status    Sodium 05/30/2023 142  135 - 145 mmol/L Final    Potassium 05/30/2023 4.0  3.4 - 4.8 mmol/L Final    Chloride 05/30/2023 108 (H)  98 - 107 mmol/L Final    CO2 05/30/2023 24.0  20.0 - 31.0 mmol/L Final    Anion Gap 05/30/2023 10  5 - 14 mmol/L Final    BUN 05/30/2023 15  9 - 23 mg/dL Final    Creatinine 16/05/9603 0.62  0.55 - 1.02 mg/dL Final    BUN/Creatinine Ratio 05/30/2023 24   Final    eGFR CKD-EPI (2021) Female 05/30/2023 >90  >=60 mL/min/1.51m2 Final    eGFR calculated with CKD-EPI 2021 equation in accordance with [employer] and [employer] of Nephrology Task Force recommendations.    Glucose 05/30/2023 159  70 - 179 mg/dL Final    Calcium 54/04/8118 9.2  8.7 - 10.4 mg/dL Final    Albumin 14/78/2956 4.1  3.4 - 5.0 g/dL Final    Total Protein 05/30/2023 7.2  5.7 - 8.2 g/dL Final    Total Bilirubin 05/30/2023 0.4  0.3 - 1.2 mg/dL Final    AST 21/30/8657 29  <=34 U/L Final    ALT 05/30/2023 33  10 - 49 U/L Final    Alkaline Phosphatase 05/30/2023 60  46 - 116 U/L Final    WBC 05/30/2023 4.3  3.6 - 11.2 10*9/L Final    RBC 05/30/2023 3.49 (L)  3.95 - 5.13 10*12/L Final    HGB 05/30/2023 11.6  11.3 - 14.9 g/dL Final    HCT 84/69/6295 33.5 (L)  34.0 - 44.0 % Final    MCV 05/30/2023 96.0 (H)  77.6 - 95.7 fL Final    MCH 05/30/2023 33.3 (H)  25.9 - 32.4 pg Final    MCHC 05/30/2023 34.7  32.0 - 36.0 g/dL Final    RDW 28/41/3244 17.2 (H)  12.2 - 15.2 % Final    MPV 05/30/2023 9.1  6.8 - 10.7 fL Final    Platelet 05/30/2023 163  150 - 450 10*9/L Final    Neutrophils % 05/30/2023 52.6  % Final    Lymphocytes % 05/30/2023 33.1  % Final    Monocytes % 05/30/2023 11.0  % Final    Eosinophils % 05/30/2023 2.5  % Final    Basophils % 05/30/2023 0.8  % Final    Absolute Neutrophils 05/30/2023 2.3  1.8 - 7.8 10*9/L Final    Absolute Lymphocytes 05/30/2023 1.4  1.1 - 3.6 10*9/L Final    Absolute Monocytes 05/30/2023 0.5  0.3 - 0.8 10*9/L Final Absolute Eosinophils 05/30/2023 0.1  0.0 - 0.5 10*9/L Final    Absolute Basophils 05/30/2023 0.0  0.0 - 0.1 10*9/L Final    Anisocytosis 05/30/2023 Slight (A)  Not Present Final

## 2023-05-30 NOTE — Unmapped (Signed)
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 She is feeling well overall, except for her concern about weight gain on her birth control. She has had no fevers recently, no nausea/vomiting, diarrhea, headaches/myalgias, abd pain, rash/skin changes. She is taking all medications as prescribed without issue. She has some constipation that comes and goes that she manages with diet. She feels the birth control contributes to her weight as well as her blood pressure and would prefer to stop. She reports she is not currently sexually active due to continued pain with intercourse. She needs to schedule follow-up with gyn, but has had trouble making an appointment through MyChart. Medication list reviewed in detail. Planning to go to [facility] next week for next IVIG dose.     Labs: WBC 4.3, ANC 2.3, Hgb 11.6, PLT 163; CMP WNL, amylase/lipase 146/87 (on 04/06/23, asymptomatic); lipid panel from 11/2822 with TC 231, TG 252, LDL-c 144  CMV viral load undetectable last done on 02/07/23  Total lgG 237 from 11/27 (now getting monthly IVIG); absolute CD4 count 276  BCR-ABL p190 9/11 negative  BMBx (10/04/22): <5% blasts, MRD negative    Oncologic History:  Hematology/Oncology History Overview Note   Referring/Local Oncologist:    Diagnosis:   10/29/2021  Bone marrow, left iliac, aspiration and biopsy  -  Hypercellular bone marrow (greater than 90%) involved by B lymphoblastic leukemia (~95%blasts by morphologic assessment of aspirate smears and touch preps)  - Abnormal Karyotype: 47,XX,t(9;22)(q34;q11.2),+der(22)t(9;22)[18]/46,XX[2]     Abnormal FISH:  A BCR/ABL1 interphase FISH assay showed a signal pattern consistent with a BCR::ABL1 rearrangement and the 9;22 translocation in 88% of the 100 cells scored. The majority of the abnormal cells (64/88) contained an additional BCR/ABL1 fusion signal, while 9/88 abnormal cells contained an additional ABL1 and ASS1 signal.       Genetics:   Karyotype/FISH:   RESULTS   Date Value Ref Range Status   10/28/2021   Final    NOTE: This report reflects a combined study from a peripheral blood and a bone marrow core biopsy. Eleven cells from the peripheral blood and nine cells from the bone marrow core biopsy were analyzed. The BCR/ABL1 FISH analysis was performed on the peripheral blood.     Abnormal Karyotype:  47,XX,t(9;22)(q34;q11.2),+der(22)t(9;22)[18]/46,XX[2]    Abnormal FISH:  A BCR/ABL1 interphase FISH assay showed a signal pattern consistent with a BCR::ABL1 rearrangement and the 9;22 translocation in 88% of the 100 cells scored. The majority of the abnormal cells (64/88) contained an additional BCR/ABL1 fusion signal, while 9/88 abnormal cells contained an additional ABL1 and ASS1 signal.           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  MRD by flow insufficient, p190 2/100,000  12/10/2021: Cycle 2 B cycle +  rituximab  12/14/21: IT#4  12/17/21: IT#5  01/10/22: Cycle 3 A cycle + rituximab  01/11/22: IT #6  01/24/22: Bmbx - MRD-neg by PCR (p190)   02/17/22: C4 B cycle + ritux  02/18/22: IT#7  03/21/22: C5 A cycle + ritux (4th dose)  03/22/22: IT#8  04/29/22: Bmbx - MRD-neg by PCR (p190)   05/18/22: restart dasatinib 70mg  s/p neutropenia  05/26/22: Start maintenance vincristine 2mg , Prednisone 200mg  D1-5   06/02/22: IT #9  06/27/22: C2 Maintenance: vincristine 2mg , Prednisone 200mg  D1-5, HOLD dasatinib due to neutropenia  07/04/22: RESTART dasatinib 70mg   07/21/22: IT #10  07/26/22: C3 Maintenance: vincristine 2mg , Prednisone 200mg  D1-5, dasatinib 70 mg  08/08/22: IT #11 - prophylaxis completed  08/23/22: C4 Maintenance: vincristine 2mg , Prednisone 200mg  D1-5, dasatinib 70 mg  09/20/22: C5 Maintenance: vincristine 2mg , Prednisone 200mg  D1-5, dasatinib 70 mg  10/04/22: Bone marrow biopsy - continued remission, MRD and BCR/ABL p190 negative  10/18/22: C6 Maintenance. ANC 0.4, HOLD dasatinib, continue vincristine 2mg  and Prednisone 200mg  D1-5  11/15/22: C7 Maintenance ANC 0.4. HOLD TKI, continue vincristine 2mg  and Prednisone 200mg  D1-5  ~11/30/22: Start ponatinib 15 mg daily  12/13/22: C8 Maintenance ANC 1.2. ponatinib 15mg , vincristine 2mg  D1 and Prednisone 200mg  D1-5  01/10/23: C9 Maintenance Anc 0.7. HOLD ponatinib, continue vincristine 2mg  D1 and Prednisone 200mg  D1-5  01/26/23: ANC 1.7, restart ponatinib 15 mg  02/08/23: C10 Maintenance. ANC is 1.4 - continue ponatinib  03/07/23: C11 Maintenance ANC is 1.1. continue ponatinib  04/06/23: C12 Maintenance  04/12/23: Bmbx - continued remission, MRD-negative, BCR/ABL undetectable     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)   10/29/2021 Initial Diagnosis    B-cell acute lymphoblastic leukemia (ALL) (CMS-HCC)     10/30/2021 - 03/30/2022 Chemotherapy    IP/OP LEUKEMIA GRAAPH-2005 < 60 YO (OP PEGFILGRASTIM ON DAY 7)  [No description for this plan]     01/06/2022 - 04/07/2022 Endocrine/Hormone Therapy    OP LEUPROLIDE (LUPRON) 11.25 MG EVERY 3 MONTHS  Plan Provider: [name], MD     05/26/2022 -  Chemotherapy    OP LEUKEMIA VINCRISTINE  vinCRIStine 2 mg IV on day 1     07/26/2022 - 07/26/2022 Endocrine/Hormone Therapy    OP LEUPROLIDE (LUPRON) 11.25 MG EVERY 3 MONTHS  Plan Provider: [name], MD         Weight and Vitals:  Wt Readings from Last 3 Encounters:   05/10/23 88 kg (194 lb)   05/02/23 87.7 kg (193 lb 5.5 oz)   04/10/23 87 kg (191 lb 12.8 oz)     Temp Readings from Last 3 Encounters:   05/10/23 35.9 ??C (96.7 ??F) (Temporal)   05/02/23 37.1 ??C (98.8 ??F) (Oral)   05/02/23 37 ??C (98.6 ??F) (Temporal)     BP Readings from Last 3 Encounters:   05/10/23 120/78   05/02/23 123/81   04/12/23 144/100     Pulse Readings from Last 3 Encounters:   05/10/23 83   05/02/23 78   05/02/23 80       Pertinent Labs:  No visits with results within 1 Day(s) from this visit.   Latest known visit with results is:   Lab on 05/02/2023   Component Date Value Ref Range Status    Sodium 05/02/2023 141  135 - 145 mmol/L Final    Potassium 05/02/2023 3.9  3.4 - 4.8 mmol/L Final    Chloride 05/02/2023 108 (H)  98 - 107 mmol/L Final    CO2 05/02/2023  27.0  20.0 - 31.0 mmol/L Final    Anion Gap 05/02/2023 6  5 - 14 mmol/L Final    BUN 05/02/2023 16  9 - 23 mg/dL Final    Creatinine 08/65/7846 0.67  0.55 - 1.02 mg/dL Final    BUN/Creatinine Ratio 05/02/2023 24   Final    eGFR CKD-EPI (2021) Female 05/02/2023 >90  >=60 mL/min/1.46m2 Final    eGFR calculated with CKD-EPI 2021 equation in accordance with [employer] and [employer] of Nephrology Task Force recommendations.    Glucose 05/02/2023 170  70 - 179 mg/dL Final    Calcium 96/29/5284 9.7  8.7 - 10.4 mg/dL Final    Albumin 13/24/4010 4.1  3.4 - 5.0 g/dL Final    Total Protein 05/02/2023 6.8  5.7 - 8.2 g/dL Final    Total Bilirubin 05/02/2023 0.5  0.3 - 1.2 mg/dL Final    AST 27/25/3664 29  <=34 U/L Final    ALT 05/02/2023 30  10 - 49 U/L Final    Alkaline Phosphatase 05/02/2023 59  46 - 116 U/L Final    WBC 05/02/2023 3.4 (L)  3.6 - 11.2 10*9/L Final    RBC 05/02/2023 3.47 (L)  3.95 - 5.13 10*12/L Final    HGB 05/02/2023 11.5  11.3 - 14.9 g/dL Final    HCT 40/34/7425 33.3 (L)  34.0 - 44.0 % Final    MCV 05/02/2023 96.0 (H)  77.6 - 95.7 fL Final    MCH 05/02/2023 33.2 (H)  25.9 - 32.4 pg Final    MCHC 05/02/2023 34.5  32.0 - 36.0 g/dL Final    RDW 95/63/8756 16.8 (H)  12.2 - 15.2 % Final    MPV 05/02/2023 9.1  6.8 - 10.7 fL Final    Platelet 05/02/2023 155  150 - 450 10*9/L Final    Neutrophils % 05/02/2023 47.2  % Final    Lymphocytes % 05/02/2023 39.3  % Final    Monocytes % 05/02/2023 11.5  % Final    Eosinophils % 05/02/2023 1.6  % Final    Basophils % 05/02/2023 0.4  % Final    Absolute Neutrophils 05/02/2023 1.6 (L)  1.8 - 7.8 10*9/L Final    Absolute Lymphocytes 05/02/2023 1.3  1.1 - 3.6 10*9/L Final    Absolute Monocytes 05/02/2023 0.4  0.3 - 0.8 10*9/L Final    Absolute Eosinophils 05/02/2023 0.1  0.0 - 0.5 10*9/L Final    Absolute Basophils 05/02/2023 0.0  0.0 - 0.1 10*9/L Final    Anisocytosis 05/02/2023 Slight (A)  Not Present Final       Allergies:   Allergies   Allergen Reactions    Erythromycin Hives     Other reaction(s): Not available    Other      Pt cant take ibuprofen due to condition.    Sulfa (Sulfonamide Antibiotics) Anaphylaxis     Other reaction(s): Not available    Azithromycin        Drug Interactions: none identified; ponatinib is a major 3A4 substrate, caution with 3A4 inhibitors; letermovir is a mod 3A4 inhibitor (no adjustments necessary for ponatinib/VCR, monitor)      Current Medications:  Current Outpatient Medications   Medication Sig Dispense Refill    acetaminophen (TYLENOL 8 HOUR) 650 MG CR tablet Take 2 tablets (1,300 mg total) by mouth every eight (8) hours as needed for pain.      albuterol HFA 90 mcg/actuation inhaler Inhale 2 puffs every six (6) hours as needed for wheezing.  aspirin (ECOTRIN) 81 MG tablet Take 1 tablet (81 mg total) by mouth daily.      cetirizine (ZYRTEC) 10 MG tablet Take 1 tablet (10 mg total) by mouth nightly. 30 tablet 2    dapsone 100 MG tablet TAKE 1 TABLET(100 MG) BY MOUTH DAILY 30 tablet 11    escitalopram oxalate (LEXAPRO) 10 MG tablet Take 1 tablet (10 mg total) by mouth daily. 90 tablet 0    letermovir (PREVYMIS) 480 mg tablet Take 1 tablet (480 mg total) by mouth daily. 28 tablet 5    levalbuterol (XOPENEX) 0.31 mg/3 mL nebulizer solution Inhale 3 mL (0.31 mg total) by nebulization every four (4) hours as needed for wheezing (cough). 45 mL 0    losartan (COZAAR) 50 MG tablet Take 1 tablet (50 mg total) by mouth daily. 30 tablet 3    norethindrone (MICRONOR) 0.35 mg tablet Take 1 tablet by mouth daily. 84 tablet 3    PONATinib (ICLUSIG) 15 mg tablet Take 1 tablet (15 mg total) by mouth daily. Swallow tablets whole. Do not crush, break, cut or chew tablets. 30 tablet 5    predniSONE (DELTASONE) 50 MG tablet Take 4 tablets (200 mg total) by mouth once daily on days 1-5 of each cycle. 20 tablet 5    rosuvastatin (CRESTOR) 10 MG tablet Take 1 tablet (10 mg total) by mouth nightly. 90 tablet 3    tizanidine (ZANAFLEX) 4 MG tablet Take 1 tablet (4 mg total) by mouth nightly. 90 tablet 2    valACYclovir (VALTREX) 500 MG tablet Take 1 tablet (500 mg total) by mouth daily. 30 tablet 12     No current facility-administered medications for this visit.       Adherence: No barriers identified/no missed doses.       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 Patient finished 5 cycles of hyperCVAD + rituximab + dasatinib before transitioning to VCR/dasatinib/prednisone maintenance . She was transitioned from dasatinib to ponatinib in April 2024, given difficulty with cytopenias on dasatinib (had been on dasatinib 70 mg daily and unable to titrate to normal maintenance dosing due to neutropenia). Previous cytopenias may have been CMV and/or valcyte related as well, and she was transitioned to letermovir by ICID to avoid valcyte-induced myelosuppression. CMV has recently remained undetectable and ICID plans to not order more levels at this time. She was transitioned to ponatinib 15 mg daily with the thought that neutropenia may have been secondary to dasatinib (started at 15 mg dosing since BCR-ABL negative). Most recent BMBx (10/04/22) showed MRD negative disease. She is tolerating ponatinib well thus far, and her ANC remains stable. Receiving IVIG to minimize infectious complications. Will defer to gyn for further birth control management on ponatinib. Patient verbalized understanding of plan below.    Plan:   1) Ph+ ALL  - Continue vincristine IV on day 1 of 28 day cycle (due today)  - Continue prednisone 200 mg (4 tablets) days 1-5 of 28 day cycle (will restart today)  - Continue ponatinib 15 mg daily (would hold if ANC <0.5 and restart when ANC > 1.0)  - Trend amylase/lipase, however elevations are asymptomatic at this time  - #12 of 12 IT therapy complete  -  RTC in 4 weeks for labs/APP visit    2) Infection ppx   -If ANC < 0.5, add levofloxacin 500 mg daily and fluconazole 200 mg daily  -Continue dapsone 100 mg PO  daily for PJP ppx  -Continue letermovir 480 mg daily (per ICID continue throughout duration of treatment)  -Continue valacyclovir 500 mg daily  -Continue IVIG - dose due 11/6 and continue every 4 weeks (scheduled to be [facility] at [facility])  -Per ICID, no further CMV monitoring    3) CV  - Continue aspirin 81 mg with ponatinib (hold if PLT ever < 50)  - Continue rosuvastatin 10 mg daily with ponatinib. Repeat fasting lipid panel after about 3 months of therapy  - Continue losartan 50 mg daily (previously on this med and dose, tolerated well), CTM BP at home    4) Supportive Care  - Continue Tylenol prn for pain. Check temperature before Tylenol dose and avoid taking NSAIDs.  - Can take Dulcolax prn for constipation  - Can take famotidine if having acid reflex from steroids.   - Most recent dose of Lupron on 07/26/22, which was final dose. Okay to stop Micronor today until follow-up with gyn for further options. Micronor removed from med list for now  - Schedule follow-up appointment with gynecology here or locally  - Continue escitalopram 10 mg daily F/u:  Future Appointments   Date Time Provider Department Center   05/30/2023  8:30 AM ADULT ONC LAB [facility]   05/30/2023  9:00 AM ONCHEM LEUKEMIA PHARMACIST [id number]   05/30/2023 10:00 AM ONCINF CHAIR 20 [id number]   06/07/2023 10:45 AM UNCTIF 18 [facility]IN[facility]   06/27/2023  8:15 AM ADULT ONC LAB [facility]   06/27/2023  9:00 AM Lenon Ahmadi, AGNP [facility]   07/04/2023  8:00 AM ADULT ONC LAB [facility]   07/04/2023  9:00 AM ONCINF CHAIR 18 [id number]   07/05/2023  1:45 PM UNCTIF 20 UNCTHERINFET [facility]   08/01/2023  8:00 AM ONCINF CHAIR 17 [id number]   08/14/2023  2:00 PM Ortencia Kick, MD [facility]   08/29/2023  8:00 AM ADULT ONC LAB [facility]   08/29/2023  9:00 AM ONCINF CHAIR 20 [id number]     I spent 20 minutes in direct patient care    Sharyn Blitz, PharmD  PGY1 Acute Care Pharmacy Resident     Ronnald Collum, PharmD, BCOP, CPP  Hematology/Oncology Clinical Pharmacist  Pager [phone]

## 2023-06-07 ENCOUNTER — Ambulatory Visit: Admit: 2023-06-07 | Discharge: 2023-06-08 | Payer: PRIVATE HEALTH INSURANCE

## 2023-06-07 DIAGNOSIS — D801 Nonfamilial hypogammaglobulinemia: Principal | ICD-10-CM

## 2023-06-07 DIAGNOSIS — C9100 Acute lymphoblastic leukemia not having achieved remission: Principal | ICD-10-CM

## 2023-06-07 MED ADMIN — diphenhydrAMINE (BENADRYL) capsule/tablet 25 mg: 25 mg | ORAL | @ 16:00:00 | Stop: 2023-06-07

## 2023-06-07 MED ADMIN — acetaminophen (TYLENOL) tablet 650 mg: 650 mg | ORAL | @ 16:00:00 | Stop: 2023-06-07

## 2023-06-07 MED ADMIN — immun glob G(IgG)-pro-IgA 0-50 (PRIVIGEN) 10 % intravenous solution 30 g: .4 g/kg | INTRAVENOUS | @ 16:00:00 | Stop: 2023-06-07

## 2023-06-07 MED ADMIN — heparin, porcine (PF) 100 unit/mL injection 500 Units: 500 [IU] | INTRAVENOUS | @ 18:00:00 | Stop: 2023-06-07

## 2023-06-07 NOTE — Unmapped (Signed)
Pt presents for IVIG, Day #1.  VSS, pt weight is 90.2 kg. Port accessed pt tolerated well, premeds administered per provider order.  Pt aware of potential reaction/side effects, call bell within reach.    Pt okay with not receiving D5W infusion with IVIG d/t IV fluid shortage. Will increase hydration at home.     1058 IVIG 30g started, to infuse at the following rates:     27 ml/hr for 15 min  90 ml/hr for 30 min  180 ml/hr for 30 min  433 ml/hr until complete.     1240 IVIG complete.  Pt tolerated without complication, VSS.    1246 Port flushed per policy and d/c'd, gauze and band-aid applied.  Pt left clinic in no acute distress.

## 2023-06-12 MED ORDER — DAPSONE 100 MG TABLET
ORAL_TABLET | Freq: Every day | ORAL | 11 refills | 30 days | Status: CP
Start: 2023-06-12 — End: ?

## 2023-06-26 DIAGNOSIS — C9100 Acute lymphoblastic leukemia not having achieved remission: Principal | ICD-10-CM

## 2023-06-26 MED ORDER — CETIRIZINE 10 MG TABLET
ORAL_TABLET | Freq: Every evening | ORAL | 3 refills | 90 days | Status: CP
Start: 2023-06-26 — End: 2024-06-25
  Filled 2023-06-27: qty 90, 90d supply, fill #0

## 2023-06-26 NOTE — Unmapped (Signed)
Pt is requesting refill    Most recent clinic visit: 05/30/2023  Next clinic visit:  06/27/2023

## 2023-06-27 ENCOUNTER — Other Ambulatory Visit: Admit: 2023-06-27 | Discharge: 2023-06-27 | Payer: PRIVATE HEALTH INSURANCE

## 2023-06-27 ENCOUNTER — Ambulatory Visit
Admit: 2023-06-27 | Discharge: 2023-06-27 | Payer: PRIVATE HEALTH INSURANCE | Attending: Adult Health | Primary: Adult Health

## 2023-06-27 ENCOUNTER — Ambulatory Visit: Admit: 2023-06-27 | Discharge: 2023-06-27 | Payer: PRIVATE HEALTH INSURANCE

## 2023-06-27 DIAGNOSIS — C9100 Acute lymphoblastic leukemia not having achieved remission: Principal | ICD-10-CM

## 2023-06-27 LAB — COMPREHENSIVE METABOLIC PANEL
ALBUMIN: 4.1 g/dL (ref 3.4–5.0)
ALKALINE PHOSPHATASE: 63 U/L (ref 46–116)
ALT (SGPT): 44 U/L (ref 10–49)
ANION GAP: 10 mmol/L (ref 5–14)
AST (SGOT): 36 U/L — ABNORMAL HIGH (ref <=34)
BILIRUBIN TOTAL: 0.5 mg/dL (ref 0.3–1.2)
BLOOD UREA NITROGEN: 13 mg/dL (ref 9–23)
BUN / CREAT RATIO: 20
CALCIUM: 9.4 mg/dL (ref 8.7–10.4)
CHLORIDE: 105 mmol/L (ref 98–107)
CO2: 27 mmol/L (ref 20.0–31.0)
CREATININE: 0.65 mg/dL (ref 0.55–1.02)
EGFR CKD-EPI (2021) FEMALE: >90 mL/min/{1.73_m2} (ref >=60)
GLUCOSE RANDOM: 122 mg/dL (ref 70–179)
POTASSIUM: 4.3 mmol/L (ref 3.4–4.8)
PROTEIN TOTAL: 7.4 g/dL (ref 5.7–8.2)
SODIUM: 142 mmol/L (ref 135–145)

## 2023-06-27 LAB — CBC W/ AUTO DIFF
BASOPHILS ABSOLUTE COUNT: 0 10*9/L (ref 0.0–0.1)
BASOPHILS RELATIVE PERCENT: 0.7 %
EOSINOPHILS ABSOLUTE COUNT: 0.1 10*9/L (ref 0.0–0.5)
EOSINOPHILS RELATIVE PERCENT: 3.1 %
HEMATOCRIT: 33.1 % — ABNORMAL LOW (ref 34.0–44.0)
HEMOGLOBIN: 11.5 g/dL (ref 11.3–14.9)
LYMPHOCYTES ABSOLUTE COUNT: 1.3 10*9/L (ref 1.1–3.6)
LYMPHOCYTES RELATIVE PERCENT: 36.2 %
MEAN CORPUSCULAR HEMOGLOBIN CONC: 34.8 g/dL (ref 32.0–36.0)
MEAN CORPUSCULAR HEMOGLOBIN: 33.2 pg — ABNORMAL HIGH (ref 25.9–32.4)
MEAN CORPUSCULAR VOLUME: 95.2 fL (ref 77.6–95.7)
MEAN PLATELET VOLUME: 8.6 fL (ref 6.8–10.7)
MONOCYTES ABSOLUTE COUNT: 0.4 10*9/L (ref 0.3–0.8)
MONOCYTES RELATIVE PERCENT: 12.2 %
NEUTROPHILS ABSOLUTE COUNT: 1.7 10*9/L — ABNORMAL LOW (ref 1.8–7.8)
NEUTROPHILS RELATIVE PERCENT: 47.8 %
PLATELET COUNT: 161 10*9/L (ref 150–450)
RED BLOOD CELL COUNT: 3.47 10*12/L — ABNORMAL LOW (ref 3.95–5.13)
RED CELL DISTRIBUTION WIDTH: 15.8 % — ABNORMAL HIGH (ref 12.2–15.2)
WBC ADJUSTED: 3.5 10*9/L — ABNORMAL LOW (ref 3.6–11.2)

## 2023-06-27 MED ADMIN — vinCRIStine (ONCOVIN) 2 mg in sodium chloride (NS) 0.9 % 25 mL IVPB: 2 mg | INTRAVENOUS | @ 16:00:00 | Stop: 2023-06-27

## 2023-06-27 MED ADMIN — heparin, porcine (PF) 100 unit/mL injection 500 Units: 500 [IU] | INTRAVENOUS | @ 16:00:00 | Stop: 2023-06-28

## 2023-06-27 MED FILL — ROSUVASTATIN 10 MG TABLET: ORAL | 90 days supply | Qty: 90 | Fill #2

## 2023-06-27 MED FILL — PREVYMIS 480 MG TABLET: ORAL | 28 days supply | Qty: 28 | Fill #2

## 2023-06-27 MED FILL — PREDNISONE 50 MG TABLET: 28 days supply | Qty: 20 | Fill #3

## 2023-06-27 NOTE — Unmapped (Signed)
[facility] Follow Up Visit Note     [name]  Patient Age: 47 y.o.  Encounter Date: 06/27/2023    Primary Care Provider:  [name], MD    Referring Physician:  Lenon [name], AGNP  [address]  [id number]  [place],  [place] [address]    Cancer Diagnosis: Ph+ B-ALL; Initial Dx 10/29/2021  Cancer status: CR1, BCR/ABL p190 negative in marrow  Treatment Regimen: First Line, ponatinib + Maintenance (vincristine, prednisone) s/p truncated course of GRAAPH-2005, HyperCVAD  Treatment Goal: Curative  Comorbidities: HTN, anxiety, hemorrhoids with anal fissures  Transplant: referral deferred in CR1    Assessment/Plan:  [name] is a 47 y.o. female with past medical history of chronic right shoulder and back pain, hypertension, anxiety, and recently diagnosed Ph+ ALL. 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  Given her deep molecular remission and her intolerance of hyperCVAD, we are transitioned to maintenance chemotherapy.     She is now s/p Cycle 14 of Maintenance ponatinib + vincristine + prednisone. Her cell counts remain stable and she is doing great. Will proceed without changes.    Continue monthly IVIG on a separate date due to insurance.       Ph+ALL, in remission: On GRAAPH 2005 + dasatinib. Completed 12/12 planned IT treatments for CNS ppx.  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 09/2022 restarted Valcyte with the goal of controlling CMV and limiting neutropenia  - continue letermovir  - d/c CMV levels as of 03/2023 - per Dr. Kari Baars    Hypogammaglobulinemia:   - Monthly IVIG started 07/26/22 - now at [facility] due to insurance coverage    Hemorrhoids, Anal fissure: persistent but stable, per patient  - supportive care at home    Pancytopenia due to chemotherapy  - ANC and Platelets sufficient to proceed without changes 06/27/2023  - supportive care as detailed below when in nadir    Menses suppression and birth control: previously received Lupron with last dose 07/26/22   - patient stopped oral contraceptive due to weight loss. Counseled on importance of birth control on TKI. She is abstinent and making an appointment with GYN       Psychosocial distress: She reports a moderate level of anxiety regarding the management of the above.   - Counseling given   - Consider ref to comprehensive cancer support program     Patient-centered care/Shared decision-making:   We discussed the plan above at length. The patient and her husband actively contributed to the conversation. Specifically, her most important outcomes are:   Prolonging overall survival and Maintaining her overall functional activity     Supportive Care Needs: We recommend based on the patient???s underlying diagnosis and treatment history the following supportive care:    1. Antimicrobial prophylaxis:    Viral -  valacyclovir and letermovir  Bacterial: Levofloxacin when ANC <0.5  Fungal: Fluconazole 200mg  when ANC <0.5  PJP: dapsone 100mg  daily    2. Blood product support:  Leukoreduced blood products are required.  Irradiated blood products are preferred, but in case of urgent transfusion needs non-irradiated blood products may be used: 2 units for Hg <=8.0.     Coordination of care:   - Continue maintenance q4 weeks    [name], AGPCNP-BC  Nurse Practitioner  Hematology/Oncology  [facility]      [name], MD was available  Leukemia Program  Division of Hematology  [facility]      Nurse Navigator (non-clinical trial patients): Elicia Lamp, RN        Tel. [phone]       Fax. [phone]  Toll-free appointments: [phone]  Scheduling assistance: [phone]  After hours/weekends: [phone] (ask for adult hematology/oncology on-call)      History of Present Illness:   Patricia [name] is a 47 y.o. female with past medical history noted as above who presents for follow up Ph+ ALL.      Her social history is notable for being a Runner, broadcasting/film/video.  She was recently married. She lives with her husband.    Interim history  Since her last visit,    She is doing great overall.  She overdid it this weekend, prepping for a craft show, so she is sore. However she is stronger with improve stamina compared to before.  Temp of 99 last week and then resumed to normal  Off the birth control pills for a few weeks due to weight gain. Abstinent.  Held the BP medication - will restart.  Will make an appointmetn with a GYN. Needs to make a appt with them  Very mild tingling in pinkie toes and in finger tips.    [name]'s family - will be with them for Thanksgiving.  Her parents will visiting for Christmas 12/19 until 28th. [place]          Oncology History is as below:   Hematology/Oncology History Overview Note   Referring/Local Oncologist:    Diagnosis:   10/29/2021  Bone marrow, left iliac, aspiration and biopsy  -  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 The majority of the abnormal cells (64/88) contained an additional BCR/ABL1 fusion signal, while 9/88 abnormal cells contained an additional ABL1 and ASS1 signal. Genetics:   Karyotype/FISH:   RESULTS   Date Value Ref Range Status   10/28/2021   Final    NOTE: This report reflects a combined study from a peripheral blood and a bone marrow core biopsy. Eleven cells from the peripheral blood and nine cells from the bone marrow core biopsy were analyzed. The BCR/ABL1 FISH analysis was performed on the peripheral blood.     Abnormal Karyotype:  47,XX,t(9;22)(q34;q11.2),+der(22)t(9;22)[18]/46,XX[2]    Abnormal FISH:  A BCR/ABL1 interphase FISH assay showed a signal pattern consistent with a BCR::ABL1 rearrangement and the 9;22 translocation in 88% of the 100 cells scored. The majority of the abnormal cells (64/88) contained an additional BCR/ABL1 fusion signal, while 9/88 abnormal cells contained an additional ABL1 and ASS1 signal.  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  MRD by flow insufficient, p190 2/100,000  12/10/2021: Cycle 2 B cycle + rituximab  12/14/21: IT#4  12/17/21: IT#5  01/10/22: Cycle 3 A cycle + rituximab  01/11/22: IT #6  01/24/22: Bmbx - MRD-neg by PCR (p190)   02/17/22: C4 B cycle + ritux  02/18/22: IT#7  03/21/22: C5 A cycle + ritux (4th dose)  03/22/22: IT#8  04/29/22: Bmbx - MRD-neg by PCR (p190)   05/18/22: restart dasatinib 70mg  s/p neutropenia  05/26/22: Start maintenance vincristine 2mg , Prednisone 200mg  D1-5   06/02/22: IT #9  06/27/22: C2 Maintenance: vincristine 2mg , Prednisone 200mg  D1-5, HOLD dasatinib due to neutropenia  07/04/22: RESTART dasatinib 70mg   07/21/22: IT #10  07/26/22: C3 Maintenance: vincristine 2mg , Prednisone 200mg  D1-5, dasatinib 70 mg  08/08/22: IT #11 - prophylaxis completed  08/23/22: C4 Maintenance: vincristine 2mg , Prednisone 200mg  D1-5, dasatinib 70 mg  09/20/22: C5 Maintenance: vincristine 2mg , Prednisone 200mg  D1-5, dasatinib 70 mg  10/04/22: Bone marrow biopsy - continued remission, MRD and BCR/ABL p190 negative  10/18/22: C6 Maintenance. ANC 0.4, HOLD dasatinib, continue vincristine 2mg  and Prednisone 200mg  D1-5  11/15/22: C7 Maintenance ANC 0.4. HOLD TKI, continue vincristine 2mg  and Prednisone 200mg  D1-5  ~11/30/22: Start ponatinib 15 mg daily  12/13/22: C8 Maintenance ANC 1.2. ponatinib 15mg , vincristine 2mg  D1 and Prednisone 200mg  D1-5  01/10/23: C9 Maintenance Anc 0.7. HOLD ponatinib, continue vincristine 2mg  D1 and Prednisone 200mg  D1-5  01/26/23: ANC 1.7, restart ponatinib 15 mg  02/08/23: C10 Maintenance. ANC is 1.4 - continue ponatinib  03/07/23: C11 Maintenance ANC is 1.1. continue ponatinib  04/06/23: C12 Maintenance  04/12/23: Bmbx - continued remission, MRD-negative, BCR/ABL undetectable  05/02/23: C13 Maintenance  05/30/23: C14 Maintenance  06/27/23: C15 Maintenance         Review of Systems:   ROS reviewed and negative except as noted in H and P     Allergies:  Allergies   Allergen Reactions    Erythromycin Hives     Other reaction(s): Not available    Other      Pt cant take ibuprofen due to condition.    Sulfa (Sulfonamide Antibiotics) Anaphylaxis     Other reaction(s): Not available    Azithromycin        Medications:     Current Outpatient Medications:     acetaminophen (TYLENOL 8 HOUR) 650 MG CR tablet, Take 2 tablets (1,300 mg total) by mouth every eight (8) hours as needed for pain., Disp: , Rfl:     albuterol HFA 90 mcg/actuation inhaler, Inhale 2 puffs every six (6) hours as needed for wheezing., Disp: , Rfl:     aspirin (ECOTRIN) 81 MG tablet, Take 1 tablet (81 mg total) by mouth daily., Disp: , Rfl:     cetirizine (ZYRTEC) 10 MG tablet, Take 1 tablet (10 mg total) by mouth nightly., Disp: 90 tablet, Rfl: 3    dapsone 100 MG tablet, Take 1 tablet (100 mg total) by mouth daily. TAKE 1 TABLET(100 MG) BY MOUTH DAILY, Disp: 30 tablet, Rfl: 11 escitalopram oxalate (LEXAPRO) 10 MG tablet, Take 1 tablet (10 mg total) by mouth daily., Disp: 90 tablet, Rfl: 0  letermovir (PREVYMIS) 480 mg tablet, Take 1 tablet (480 mg total) by mouth daily., Disp: 28 tablet, Rfl: 5    PONATinib (ICLUSIG) 15 mg tablet, Take 1 tablet (15 mg total) by mouth daily. Swallow tablets whole. Do not crush, break, cut or chew tablets., Disp: 30 tablet, Rfl: 5    predniSONE (DELTASONE) 50 MG tablet, Take 4 tablets (200 mg total) by mouth once daily on days 1-5 of each cycle., Disp: 20 tablet, Rfl: 5    rosuvastatin (CRESTOR) 10 MG tablet, Take 1 tablet (10 mg total) by mouth nightly., Disp: 90 tablet, Rfl: 3    tizanidine (ZANAFLEX) 4 MG tablet, Take 1 tablet (4 mg total) by mouth nightly., Disp: 90 tablet, Rfl: 2    valACYclovir (VALTREX) 500 MG tablet, Take 1 tablet (500 mg total) by mouth daily., Disp: 30 tablet, Rfl: 12    levalbuterol (XOPENEX) 0.31 mg/3 mL nebulizer solution, Inhale 3 mL (0.31 mg total) by nebulization every four (4) hours as needed for wheezing (cough)., Disp: 45 mL, Rfl: 0    losartan (COZAAR) 50 MG tablet, Take 1 tablet (50 mg total) by mouth daily. (Patient not taking: Reported on 06/27/2023), Disp: 30 tablet, Rfl: 3    Medical History:  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    EBV infection        Social History:  Social History     Social History Narrative    Not on file       Family History:  No family history on file.    Objective:   BP 141/96  - Pulse 83  - Temp 36.8 ??C (98.3 ??F) (Oral)  - Resp 16  - Ht 170.2 cm (5' 7.01)  - Wt 89.8 kg (197 lb 15.6 oz)  - SpO2 95%  - BMI 31.00 kg/m??     Physical Exam:  GENERAL: well-appearing  woman, ab husband   HEART: Normal color, not excessive pallor.  CHEST/LUNG: Normal work of breathing. No dyspnea with conversation.   ABDOMEN: No obvious distention.    EXTREMITIES: No edema, cyanosis or clubbing.   SKIN: no rashes noted  NEURO EXAM: Grossly intact.       Test Results:  06/27/2023 CBC/d and CMP reviewed with patient

## 2023-06-27 NOTE — Unmapped (Signed)
Chief concern/reason for visit:  Client here for administration of Vincristine. Client has port positive for blood return. Client completed all infusions without complications. Client discharged home in stable condition.     Assessment/Plan:   Client receiving treatment for the following diagnosis:  1. 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

## 2023-06-27 NOTE — Unmapped (Signed)
You are doing great!  Proceed to the next cycle  IV Vincristine today  Prednisone Daily for 5 days  Ponatinib and aspirin daily  IVIG on a separate date    I will see you in 4 weeks (12/23) for the next cycle

## 2023-06-27 NOTE — Unmapped (Signed)
RED ZONE Means: RED ZONE: Take action now!     You need to be seen right away  Symptoms are at a severe level of discomfort    Call 911 or go to your nearest  Hospital for help     - Bleeding that will not stop    - Hard to breathe    - New seizure - Chest pain  - Fall or passing out  -Thoughts of hurting    yourself or others      Call 911 if you are going into the RED ZONE                  YELLOW ZONE Means:     Please call with any new or worsening symptom(s), even if not on this list.  Call [phone]  After hours, weekends, and holidays - you will reach a long recording with specific instructions, If not in an emergency such as above, please listen closely all the way to the end and choose the option that relates to your need.   You can be seen by a provider the same day through our Same Day Acute Care for Patients with Cancer program.      YELLOW ZONE: Take action today     Symptoms are new or worsening  You are not within your goal range for:    - Pain    - Shortness of breath    - Bleeding (nose, urine, stool, wound)    - Feeling sick to your stomach and throwing up    - Mouth sores/pain in your mouth or throat    - Hard stool or very loose stools (increase in       ostomy output)    - No urine for 12 hours    - Feeding tube or other catheter/tube issue    - Redness or pain at previous IV or port/catheter site    - Depressed or anxiety   - Swelling (leg, arm, abdomen,     face, neck)  - Skin rash or skin changes  - Wound issues (redness, drainage,    re-opened)  - Confusion  - Vision changes  - Fever >100.4 F or chills  - Worsening cough with mucus that is    green, yellow, or bloody  - Pain or burning when going to the    bathroom  - Home Infusion Pump Issue- call    [phone]         Call your healthcare provider if you are going into the YELLOW ZONE     GREEN ZONE Means:  Your symptoms are under controls  Continue to take your medicine as ordered  Keep all visits to the provider GREEN ZONE: You are in control  No increase or worsening symptoms  Able to take your medicine  Able to drink and eat    - DO NOT use MyChart messages to report red or yellow symptoms. Allow up to 3    business days for a reply.  -MyChart is for non-urgent medication refills, scheduling requests, or other general questions.         [id number] Rev. 01/28/2022  Approved by Oncology Patient Education Committee         Lab on 06/27/2023   Component Date Value Ref Range Status    Sodium 06/27/2023 142  135 - 145 mmol/L Final    Potassium 06/27/2023 4.3  3.4 - 4.8 mmol/L Final    Chloride 06/27/2023 105  98 -  107 mmol/L Final    CO2 06/27/2023 27.0  20.0 - 31.0 mmol/L Final    Anion Gap 06/27/2023 10  5 - 14 mmol/L Final    BUN 06/27/2023 13  9 - 23 mg/dL Final    Creatinine 09/81/1914 0.65  0.55 - 1.02 mg/dL Final    BUN/Creatinine Ratio 06/27/2023 20   Final    eGFR CKD-EPI (2021) Female 06/27/2023 >90  >=60 mL/min/1.42m2 Final    eGFR calculated with CKD-EPI 2021 equation in accordance with [employer] and [employer] of Nephrology Task Force recommendations.    Glucose 06/27/2023 122  70 - 179 mg/dL Final    Calcium 78/29/5621 9.4  8.7 - 10.4 mg/dL Final    Albumin 30/86/5784 4.1  3.4 - 5.0 g/dL Final    Total Protein 06/27/2023 7.4  5.7 - 8.2 g/dL Final    Total Bilirubin 06/27/2023 0.5  0.3 - 1.2 mg/dL Final    AST 69/62/9528 36 (H)  <=34 U/L Final    ALT 06/27/2023 44  10 - 49 U/L Final    Alkaline Phosphatase 06/27/2023 63  46 - 116 U/L Final    WBC 06/27/2023 3.5 (L)  3.6 - 11.2 10*9/L Final    RBC 06/27/2023 3.47 (L)  3.95 - 5.13 10*12/L Final    HGB 06/27/2023 11.5  11.3 - 14.9 g/dL Final    HCT 41/32/4401 33.1 (L)  34.0 - 44.0 % Final    MCV 06/27/2023 95.2  77.6 - 95.7 fL Final    MCH 06/27/2023 33.2 (H)  25.9 - 32.4 pg Final    MCHC 06/27/2023 34.8  32.0 - 36.0 g/dL Final    RDW 02/72/5366 15.8 (H)  12.2 - 15.2 % Final    MPV 06/27/2023 8.6  6.8 - 10.7 fL Final    Platelet 06/27/2023 161  150 - 450 10*9/L Final    Neutrophils % 06/27/2023 47.8  % Final    Lymphocytes % 06/27/2023 36.2  % Final    Monocytes % 06/27/2023 12.2  % Final    Eosinophils % 06/27/2023 3.1  % Final    Basophils % 06/27/2023 0.7  % Final    Absolute Neutrophils 06/27/2023 1.7 (L)  1.8 - 7.8 10*9/L Final    Absolute Lymphocytes 06/27/2023 1.3  1.1 - 3.6 10*9/L Final    Absolute Monocytes 06/27/2023 0.4  0.3 - 0.8 10*9/L Final    Absolute Eosinophils 06/27/2023 0.1  0.0 - 0.5 10*9/L Final    Absolute Basophils 06/27/2023 0.0  0.0 - 0.1 10*9/L Final

## 2023-06-28 DIAGNOSIS — C9100 Acute lymphoblastic leukemia not having achieved remission: Principal | ICD-10-CM

## 2023-07-03 DIAGNOSIS — C9100 Acute lymphoblastic leukemia not having achieved remission: Principal | ICD-10-CM

## 2023-07-03 NOTE — Unmapped (Signed)
[facility] Delivery Pharm[facility] Refill Coordination Note    Patricia [name], DOB: 1975-10-25  Phone: [phone] (home)       All above HIPAA information was verified with patient.         07/02/2023     9:49 AM   Specialty Rx Medication Refill Questionnaire   Which Medications would you like refilled and shipped? Iclusig 15mg    Please list all current allergies: Nothing new or that pertains to thia med   Have you missed any doses in the last 30 days? No   Have you had any changes to your medication(s) since your last refill? No   How many days remaining of each medication do you have at home? 13   Have you experienced any side effects in the last 30 days? No   Please enter the full address (street address, city, state, zip code) where you would like your medication(s) to be delivered to. [address], [place] [address]   Please specify on which day you would like your medication(s) to arrive. Note: if you need your medication(s) within 3 days, please call the pharmacy to schedule your order at [phone]  07/07/2023   Has your insurance changed since your last refill? No   Would you like a pharmacist to call you to discuss your medication(s)? No   Do you require a signature for your package? (Note: if we are billing Medicare Part B or your order contains a controlled substance, we will require a signature) No         Completed refill call assessment today to schedule patient's medication shipment from the [facility] [phone]).  All relevant notes have been reviewed.       Confirmed patient received a Conservation officer, historic buildings and a Surveyor, mining with first shipment. The patient will receive a drug information handout for each medication shipped and additional FDA Medication Guides as required.         REFERRAL TO PHARMACIST     Referral to the pharmacist: Not needed      [facility]     Shipping address confirmed in Epic.     Delivery Scheduled: Yes, Expected medication delivery date: 07/07/23.     Medication will be delivered via Next Day Courier to the prescription address in Epic [place].    Patricia [name]   [facility] Delivery Pharm[facility] Specialty Technician

## 2023-07-04 DIAGNOSIS — C9100 Acute lymphoblastic leukemia not having achieved remission: Principal | ICD-10-CM

## 2023-07-05 ENCOUNTER — Ambulatory Visit: Admit: 2023-07-05 | Discharge: 2023-07-06 | Payer: PRIVATE HEALTH INSURANCE

## 2023-07-05 MED ADMIN — diphenhydrAMINE (BENADRYL) capsule/tablet 25 mg: 25 mg | ORAL | @ 19:00:00 | Stop: 2023-07-05

## 2023-07-05 MED ADMIN — heparin, porcine (PF) 100 unit/mL injection 500 Units: 500 [IU] | INTRAVENOUS | @ 20:00:00 | Stop: 2023-07-05

## 2023-07-05 MED ADMIN — immun glob G(IgG)-pro-IgA 0-50 (PRIVIGEN) 10 % intravenous solution 30 g: .4 g/kg | INTRAVENOUS | @ 19:00:00 | Stop: 2023-07-05

## 2023-07-05 MED ADMIN — acetaminophen (TYLENOL) tablet 650 mg: 650 mg | ORAL | @ 19:00:00 | Stop: 2023-07-05

## 2023-07-05 NOTE — Unmapped (Signed)
Pt presents for IVIG.  VSS, pt weight is 91.4 kg. Pt denies any recent illnesses fevers or abx use.  Pt with dual port in place, latera port accessed via 20 g 3/4 huber needle, brisk blood return noted, premeds administered.  Pt aware of potential reaction/side effects, call bell within reach.    1346 IVIG 30 g started, to infuse at the following rates:    27 ml/hr for 15 min  91 ml/hr for 30 min  183 ml/hr for 30 min  439 ml/hr until complete.    1526 IVIG complete.  Pt tolerated without complication, VSS. Port flushed per policy and heparin locked. Port de-accessed, bandaid and gauze applied.  Pt left clinic in no acute distress.

## 2023-07-06 MED FILL — ICLUSIG 15 MG TABLET: ORAL | 30 days supply | Qty: 30 | Fill #1

## 2023-07-11 DIAGNOSIS — J321 Chronic frontal sinusitis: Principal | ICD-10-CM

## 2023-07-11 DIAGNOSIS — C9100 Acute lymphoblastic leukemia not having achieved remission: Principal | ICD-10-CM

## 2023-07-11 MED ORDER — AMOXICILLIN 875 MG-POTASSIUM CLAVULANATE 125 MG TABLET
ORAL_TABLET | Freq: Two times a day (BID) | ORAL | 0 refills | 7.00 days | Status: CP
Start: 2023-07-11 — End: 2023-07-18

## 2023-07-11 MED ORDER — LOSARTAN 50 MG TABLET
ORAL_TABLET | Freq: Every day | ORAL | 3 refills | 30 days | Status: CP
Start: 2023-07-11 — End: 2024-07-10

## 2023-07-24 ENCOUNTER — Ambulatory Visit
Admit: 2023-07-24 | Discharge: 2023-07-24 | Payer: PRIVATE HEALTH INSURANCE | Attending: Adult Health | Primary: Adult Health

## 2023-07-24 ENCOUNTER — Ambulatory Visit: Admit: 2023-07-24 | Discharge: 2023-07-24 | Payer: PRIVATE HEALTH INSURANCE

## 2023-07-24 ENCOUNTER — Other Ambulatory Visit: Admit: 2023-07-24 | Discharge: 2023-07-24 | Payer: PRIVATE HEALTH INSURANCE

## 2023-07-24 DIAGNOSIS — C9101 Acute lymphoblastic leukemia, in remission: Principal | ICD-10-CM

## 2023-07-24 DIAGNOSIS — C9100 Acute lymphoblastic leukemia not having achieved remission: Principal | ICD-10-CM

## 2023-07-24 LAB — CBC W/ AUTO DIFF
BASOPHILS ABSOLUTE COUNT: 0 10*9/L (ref 0.0–0.1)
BASOPHILS RELATIVE PERCENT: 0.6 %
EOSINOPHILS ABSOLUTE COUNT: 0.1 10*9/L (ref 0.0–0.5)
EOSINOPHILS RELATIVE PERCENT: 2.7 %
HEMATOCRIT: 32.9 % — ABNORMAL LOW (ref 34.0–44.0)
HEMOGLOBIN: 11.4 g/dL (ref 11.3–14.9)
LYMPHOCYTES ABSOLUTE COUNT: 1.5 10*9/L (ref 1.1–3.6)
LYMPHOCYTES RELATIVE PERCENT: 36.4 %
MEAN CORPUSCULAR HEMOGLOBIN CONC: 34.7 g/dL (ref 32.0–36.0)
MEAN CORPUSCULAR HEMOGLOBIN: 33 pg — ABNORMAL HIGH (ref 25.9–32.4)
MEAN CORPUSCULAR VOLUME: 95.2 fL (ref 77.6–95.7)
MEAN PLATELET VOLUME: 9.1 fL (ref 6.8–10.7)
MONOCYTES ABSOLUTE COUNT: 0.4 10*9/L (ref 0.3–0.8)
MONOCYTES RELATIVE PERCENT: 11.1 %
NEUTROPHILS ABSOLUTE COUNT: 2 10*9/L (ref 1.8–7.8)
NEUTROPHILS RELATIVE PERCENT: 49.2 %
PLATELET COUNT: 172 10*9/L (ref 150–450)
RED BLOOD CELL COUNT: 3.46 10*12/L — ABNORMAL LOW (ref 3.95–5.13)
RED CELL DISTRIBUTION WIDTH: 16.8 % — ABNORMAL HIGH (ref 12.2–15.2)
WBC ADJUSTED: 4 10*9/L (ref 3.6–11.2)

## 2023-07-24 LAB — COMPREHENSIVE METABOLIC PANEL
ALBUMIN: 4 g/dL (ref 3.4–5.0)
ALKALINE PHOSPHATASE: 62 U/L (ref 46–116)
ALT (SGPT): 40 U/L (ref 10–49)
ANION GAP: 10 mmol/L (ref 5–14)
AST (SGOT): 32 U/L (ref <=34)
BILIRUBIN TOTAL: 0.4 mg/dL (ref 0.3–1.2)
BLOOD UREA NITROGEN: 18 mg/dL (ref 9–23)
BUN / CREAT RATIO: 26
CALCIUM: 9.6 mg/dL (ref 8.7–10.4)
CHLORIDE: 104 mmol/L (ref 98–107)
CO2: 26 mmol/L (ref 20.0–31.0)
CREATININE: 0.68 mg/dL (ref 0.55–1.02)
EGFR CKD-EPI (2021) FEMALE: >90 mL/min/{1.73_m2} (ref >=60)
GLUCOSE RANDOM: 157 mg/dL (ref 70–179)
POTASSIUM: 4.2 mmol/L (ref 3.4–4.8)
PROTEIN TOTAL: 7.2 g/dL (ref 5.7–8.2)
SODIUM: 140 mmol/L (ref 135–145)

## 2023-07-24 MED ORDER — VALACYCLOVIR 500 MG TABLET
ORAL_TABLET | Freq: Every day | ORAL | 12 refills | 30.00 days
Start: 2023-07-24 — End: 2024-08-17

## 2023-07-24 MED ADMIN — heparin, porcine (PF) 100 unit/mL injection 500 Units: 500 [IU] | INTRAVENOUS | @ 17:00:00 | Stop: 2023-07-25

## 2023-07-24 MED ADMIN — vinCRIStine (ONCOVIN) 2 mg in sodium chloride (NS) 0.9 % 25 mL IVPB: 2 mg | INTRAVENOUS | @ 16:00:00 | Stop: 2023-07-24

## 2023-07-24 MED FILL — PREDNISONE 50 MG TABLET: 28 days supply | Qty: 20 | Fill #4

## 2023-07-24 MED FILL — PREVYMIS 480 MG TABLET: ORAL | 28 days supply | Qty: 28 | Fill #3

## 2023-07-24 NOTE — Unmapped (Signed)
[facility]ukemia [facility] Follow Up Visit Note     [name]  Patient Age: 47 y.o.  Encounter Date: 07/24/2023    Primary Care Provider:  [name], MD    Referring Physician:  Marisa Sprinkles Per Patient  [address] [place],  [place] [address]    Cancer Diagnosis: Ph+ B-ALL; Initial Dx 10/29/2021  Cancer status: CR1, BCR/ABL p190 negative in marrow  Treatment Regimen: First Line, ponatinib + Maintenance (vincristine, prednisone) s/p truncated course of GRAAPH-2005, HyperCVAD  Treatment Goal: Curative  Comorbidities: HTN, anxiety, hemorrhoids with anal fissures  Transplant: referral deferred in CR1    Assessment/Plan:  Patricia [name] is a 47 y.o. female with past medical history of chronic right shoulder and back pain, hypertension, anxiety, and recently diagnosed Ph+ ALL. 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  Given her deep molecular remission and her intolerance of hyperCVAD, we are transitioned to maintenance chemotherapy.     She is now s/p Cycle 15 of Maintenance ponatinib + vincristine + prednisone. Her cell counts remain stable and she is doing well overall. BCR/ABL 2/100,000. First detectable level. Discussed that we don't recommend any changes if the level is in the single digits. She had a likely sinus infection but it resolved s/p a course of Augmenting. Will proceed without changes.    Continue monthly IVIG on a separate date due to insurance.       Ph+ALL, in remission: On GRAAPH 2005 + dasatinib. Completed 12/12 planned IT treatments for CNS ppx.  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 09/2022 restarted Valcyte with the goal of controlling CMV and limiting neutropenia  - continue letermovir  - d/c CMV levels as of 03/2023 - per Dr. Kari Baars    Hypogammaglobulinemia:   - Monthly IVIG started 07/26/22 - now at [facility] due to insurance coverage    Hemorrhoids, Anal fissure: persistent but stable, per patient  - supportive care at home    Pancytopenia due to chemotherapy  - ANC and Platelets sufficient to proceed without changes 07/24/2023  - supportive care as detailed below when in nadir    Menses suppression and birth control: previously received Lupron with last dose 07/26/22   - patient stopped oral contraceptive due to weight loss. Counseled on importance of birth control on TKI. She is abstinent and making an appointment with GYN       Psychosocial distress: She reports a moderate level of anxiety regarding the management of the above.   - Counseling given   - Consider ref to comprehensive cancer support program     Patient-centered care/Shared decision-making:   We discussed the plan above at length. The patient and her husband actively contributed to the conversation. Specifically, her most important outcomes are:   Prolonging overall survival and Maintaining her overall functional activity     Supportive  Care Needs: We recommend based on the patient???s underlying diagnosis and treatment history the following supportive care:    1. Antimicrobial prophylaxis:    Viral - valacyclovir and letermovir  Bacterial: Levofloxacin when ANC <0.5  Fungal: Fluconazole 200mg  when ANC <0.5  PJP: dapsone 100mg  daily    2. Blood product support:  Leukoreduced blood products are required.  Irradiated blood products are preferred, but in case of urgent transfusion needs non-irradiated blood products may be used: 2 units for Hg <=8.0.     Coordination of care:   - Continue maintenance q4 weeks    [name], AGPCNP-BC  Nurse Practitioner  Hematology/Oncology  [facility]      [name], MD was available  Leukemia Program  Division of Hematology  [facility]      Nurse Navigator (non-clinical trial patients): Elicia Lamp, RN        Tel. [phone]       Fax. [phone]  Toll-free appointments: [phone]  Scheduling assistance: [phone]  After hours/weekends: [phone] (ask for adult hematology/oncology on-call)      History of Present Illness:   Patricia [name] is a 47 y.o. female with past medical history noted as above who presents for follow up Ph+ ALL.      Her social history is notable for being a Runner, broadcasting/film/video.  She was recently married. She lives with her husband.    Interim history  Since her last visit,    She is doing well.  She had symptoms concerning for a sinus infection (she has a long hx of recurrent sinus infections). Resolved with a course of Augmentin.   Tingling in her feet is a little increased - in the lateral toes on both feet.  No other concerns.          Oncology History is as below:   Hematology/Oncology History Overview Note   Referring/Local Oncologist:    Diagnosis:   10/29/2021  Bone marrow, left iliac, aspiration and biopsy  -  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 The majority of the abnormal cells (64/88) contained an additional BCR/ABL1 fusion signal, while 9/88 abnormal cells contained an additional ABL1 and ASS1 signal.       Genetics:   Karyotype/FISH:   RESULTS   Date Value Ref Range Status   10/28/2021   Final    NOTE: This report reflects a combined study from a peripheral blood and a bone marrow core biopsy. Eleven cells from the peripheral blood and nine cells from the bone marrow core biopsy were analyzed. The BCR/ABL1 FISH analysis was performed on the peripheral blood.     Abnormal Karyotype:  47,XX,t(9;22)(q34;q11.2),+der(22)t(9;22)[18]/46,XX[2]    Abnormal FISH:  A BCR/ABL1 interphase FISH assay showed a signal pattern consistent with a BCR::ABL1 rearrangement and the 9;22 translocation in 88% of the 100 cells scored. The majority of the abnormal cells (64/88) contained an additional BCR/ABL1 fusion signal, while 9/88 abnormal cells contained an additional ABL1 and ASS1 signal.           Pertinent Phenotypic data:  CD19 98%  CD20 on diagnosis 51%  CD22 98%      Treatment Timeline:  10/29/2021: Bone marrow biopsy:  Ph+ ALL, 51% expression of CD20  10/30/21: Cycle 1 day 1 GRAAPH-2005 induction  11/03/21: ITT #1   11/12/2021: IT #2  11/18/2021: IT #3  11/29/2021: Post cycle 1 bone marrow biopsy: Morphologic CR.  MRD by flow insufficient, p190 2/100,000  12/10/2021: Cycle 2 B cycle + rituximab  12/14/21: IT#4  12/17/21: IT#5  01/10/22: Cycle 3 A cycle + rituximab  01/11/22: IT #6  01/24/22: Bmbx - MRD-neg by PCR (p190)   02/17/22: C4 B cycle + ritux  02/18/22: IT#7  03/21/22: C5 A cycle + ritux (4th dose)  03/22/22: IT#8  04/29/22: Bmbx - MRD-neg by PCR (p190)   05/18/22: restart dasatinib 70mg  s/p neutropenia  05/26/22: Start maintenance vincristine 2mg , Prednisone 200mg  D1-5   06/02/22: IT #9  06/27/22: C2 Maintenance: vincristine 2mg , Prednisone 200mg  D1-5, HOLD dasatinib due to neutropenia  07/04/22: RESTART dasatinib 70mg   07/21/22: IT #10  07/26/22: C3 Maintenance: vincristine 2mg , Prednisone 200mg  D1-5, dasatinib 70 mg  08/08/22: IT #11 - prophylaxis completed  08/23/22: C4 Maintenance: vincristine 2mg , Prednisone 200mg  D1-5, dasatinib 70 mg  09/20/22: C5 Maintenance: vincristine 2mg , Prednisone 200mg  D1-5, dasatinib 70 mg  10/04/22: Bone marrow biopsy - continued remission, MRD and BCR/ABL p190 negative  10/18/22: C6 Maintenance. ANC 0.4, HOLD dasatinib, continue vincristine 2mg  and Prednisone 200mg  D1-5  11/15/22: C7 Maintenance ANC 0.4. HOLD TKI, continue vincristine 2mg  and Prednisone 200mg  D1-5  ~11/30/22: Start ponatinib 15 mg daily  12/13/22: C8 Maintenance ANC 1.2. ponatinib 15mg , vincristine 2mg  D1 and Prednisone 200mg  D1-5  01/10/23: C9 Maintenance Anc 0.7. HOLD ponatinib, continue vincristine 2mg  D1 and Prednisone 200mg  D1-5  01/26/23: ANC 1.7, restart ponatinib 15 mg  02/08/23: C10 Maintenance. ANC is 1.4 - continue ponatinib  03/07/23: C11 Maintenance ANC is 1.1. continue ponatinib  04/06/23: C12 Maintenance  04/12/23: Bmbx - continued remission, MRD-negative, BCR/ABL undetectable  05/02/23: C13 Maintenance  05/30/23: C14 Maintenance  06/27/23: C15 Maintenance PB 210 2/100,000  07/23/23: C16 Maintenance           Review of Systems:   ROS reviewed and negative except as noted in H and P     Allergies:  Allergies   Allergen Reactions    Erythromycin Hives     Other reaction(s): Not available    Other      Pt cant take ibuprofen due to condition.    Sulfa (Sulfonamide Antibiotics) Anaphylaxis     Other reaction(s): Not available    Azithromycin        Medications:     Current Outpatient Medications:     acetaminophen (TYLENOL 8 HOUR) 650 MG CR tablet, Take 2 tablets (1,300 mg total) by mouth every eight (8) hours as needed for pain., Disp: , Rfl:     albuterol HFA 90 mcg/actuation inhaler, Inhale 2 puffs every six (6) hours as needed for wheezing., Disp: , Rfl:     aspirin (ECOTRIN) 81 MG tablet, Take 1 tablet (81 mg total) by mouth daily., Disp: , Rfl:     cetirizine (ZYRTEC) 10 MG tablet, Take 1 tablet (10 mg total) by mouth nightly., Disp: 90 tablet, Rfl: 3    dapsone 100 MG tablet, Take 1 tablet (100 mg total) by mouth daily. TAKE 1 TABLET(100 MG) BY MOUTH DAILY, Disp: 30 tablet, Rfl: 11    escitalopram oxalate (LEXAPRO) 10 MG tablet, Take 1 tablet (10 mg total) by mouth daily., Disp: 90 tablet, Rfl: 0 letermovir (PREVYMIS) 480 mg tablet, Take 1 tablet (480 mg total) by mouth daily.,  Disp: 28 tablet, Rfl: 5    losartan (COZAAR) 50 MG tablet, Take 1 tablet (50 mg total) by mouth daily., Disp: 30 tablet, Rfl: 3    PONATinib (ICLUSIG) 15 mg tablet, Take 1 tablet (15 mg total) by mouth daily. Swallow tablets whole. Do not crush, break, cut or chew tablets., Disp: 30 tablet, Rfl: 5    predniSONE (DELTASONE) 50 MG tablet, Take 4 tablets (200 mg total) by mouth once daily on days 1-5 of each cycle., Disp: 20 tablet, Rfl: 5    rosuvastatin (CRESTOR) 10 MG tablet, Take 1 tablet (10 mg total) by mouth nightly., Disp: 90 tablet, Rfl: 3    valACYclovir (VALTREX) 500 MG tablet, Take 1 tablet (500 mg total) by mouth daily., Disp: 30 tablet, Rfl: 12    levalbuterol (XOPENEX) 0.31 mg/3 mL nebulizer solution, Inhale 3 mL (0.31 mg total) by nebulization every four (4) hours as needed for wheezing (cough)., Disp: 45 mL, Rfl: 0    Medical History:  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    EBV infection        Social History:  Social History     Social History Narrative    Not on file       Family History:  No family history on file.    Objective:   BP 151/93  - Pulse 87  - Temp 36.6 ??C (97.9 ??F) (Temporal)  - Resp 18  - Wt 92 kg (202 lb 13.2 oz)  - SpO2 96%  - BMI 31.76 kg/m??     Physical Exam:  GENERAL: well-appearing  woman, ab husband   HEART: Normal color, not excessive pallor.  CHEST/LUNG: Normal work of breathing. No dyspnea with conversation.   ABDOMEN: No obvious distention.    EXTREMITIES: No edema, cyanosis or clubbing.   SKIN: no rashes noted  NEURO EXAM: Grossly intact.       Test Results:  07/24/2023 CBC/d and CMP reviewed with patient

## 2023-07-24 NOTE — Unmapped (Signed)
Patient arrives to infusion room for C16 Day 1 of Vincristine. Patient arrives with right chest Mediport already accessed; + blood return noted. Lab work drawn from right chest Mediport. Patient denies any complaints. IV Vincristine given as ordered without complication. Port de-accessed per protocol. Patient declining AVS. Patient left infusion room in stable condition.

## 2023-07-24 NOTE — Unmapped (Signed)
RED ZONE Means: RED ZONE: Take action now!     You need to be seen right away  Symptoms are at a severe level of discomfort    Call 911 or go to your nearest  Hospital for help     - Bleeding that will not stop    - Hard to breathe    - New seizure - Chest pain  - Fall or passing out  -Thoughts of hurting    yourself or others      Call 911 if you are going into the RED ZONE                  YELLOW ZONE Means:     Please call with any new or worsening symptom(s), even if not on this list.  Call [phone]  After hours, weekends, and holidays - you will reach a long recording with specific instructions, If not in an emergency such as above, please listen closely all the way to the end and choose the option that relates to your need.   You can be seen by a provider the same day through our Same Day Acute Care for Patients with Cancer program.      YELLOW ZONE: Take action today     Symptoms are new or worsening  You are not within your goal range for:    - Pain    - Shortness of breath    - Bleeding (nose, urine, stool, wound)    - Feeling sick to your stomach and throwing up    - Mouth sores/pain in your mouth or throat    - Hard stool or very loose stools (increase in       ostomy output)    - No urine for 12 hours    - Feeding tube or other catheter/tube issue    - Redness or pain at previous IV or port/catheter site    - Depressed or anxiety   - Swelling (leg, arm, abdomen,     face, neck)  - Skin rash or skin changes  - Wound issues (redness, drainage,    re-opened)  - Confusion  - Vision changes  - Fever >100.4 F or chills  - Worsening cough with mucus that is    green, yellow, or bloody  - Pain or burning when going to the    bathroom  - Home Infusion Pump Issue- call    [phone]         Call your healthcare provider if you are going into the YELLOW ZONE     GREEN ZONE Means:  Your symptoms are under controls  Continue to take your medicine as ordered  Keep all visits to the provider GREEN ZONE: You are in control  No increase or worsening symptoms  Able to take your medicine  Able to drink and eat    - DO NOT use MyChart messages to report red or yellow symptoms. Allow up to 3    business days for a reply.  -MyChart is for non-urgent medication refills, scheduling requests, or other general questions.         [id number] Rev. 01/28/2022  Approved by Oncology Patient Education Committee     Lab on 07/24/2023   Component Date Value Ref Range Status    Sodium 07/24/2023 140  135 - 145 mmol/L Final    Potassium 07/24/2023 4.2  3.4 - 4.8 mmol/L Final    Chloride 07/24/2023 104  98 - 107 mmol/L Final  CO2 07/24/2023 26.0  20.0 - 31.0 mmol/L Final    Anion Gap 07/24/2023 10  5 - 14 mmol/L Final    BUN 07/24/2023 18  9 - 23 mg/dL Final    Creatinine 02/72/5366 0.68  0.55 - 1.02 mg/dL Final    BUN/Creatinine Ratio 07/24/2023 26   Final    eGFR CKD-EPI (2021) Female 07/24/2023 >90  >=60 mL/min/1.6m2 Final    eGFR calculated with CKD-EPI 2021 equation in accordance with [employer] and [employer] of Nephrology Task Force recommendations.    Glucose 07/24/2023 157  70 - 179 mg/dL Final    Calcium 44/09/4740 9.6  8.7 - 10.4 mg/dL Final    Albumin 59/56/3875 4.0  3.4 - 5.0 g/dL Final    Total Protein 07/24/2023 7.2  5.7 - 8.2 g/dL Final    Total Bilirubin 07/24/2023 0.4  0.3 - 1.2 mg/dL Final    AST 64/33/2951 32  <=34 U/L Final    ALT 07/24/2023 40  10 - 49 U/L Final    Alkaline Phosphatase 07/24/2023 62  46 - 116 U/L Final    WBC 07/24/2023 4.0  3.6 - 11.2 10*9/L Final    RBC 07/24/2023 3.46 (L)  3.95 - 5.13 10*12/L Final    HGB 07/24/2023 11.4  11.3 - 14.9 g/dL Final    HCT 88/41/6606 32.9 (L)  34.0 - 44.0 % Final    MCV 07/24/2023 95.2  77.6 - 95.7 fL Final    MCH 07/24/2023 33.0 (H)  25.9 - 32.4 pg Final    MCHC 07/24/2023 34.7  32.0 - 36.0 g/dL Final    RDW 30/16/0109 16.8 (H)  12.2 - 15.2 % Final    MPV 07/24/2023 9.1  6.8 - 10.7 fL Final    Platelet 07/24/2023 172  150 - 450 10*9/L Final Neutrophils % 07/24/2023 49.2  % Final    Lymphocytes % 07/24/2023 36.4  % Final    Monocytes % 07/24/2023 11.1  % Final    Eosinophils % 07/24/2023 2.7  % Final    Basophils % 07/24/2023 0.6  % Final    Absolute Neutrophils 07/24/2023 2.0  1.8 - 7.8 10*9/L Final    Absolute Lymphocytes 07/24/2023 1.5  1.1 - 3.6 10*9/L Final    Absolute Monocytes 07/24/2023 0.4  0.3 - 0.8 10*9/L Final    Absolute Eosinophils 07/24/2023 0.1  0.0 - 0.5 10*9/L Final    Absolute Basophils 07/24/2023 0.0  0.0 - 0.1 10*9/L Final    Anisocytosis 07/24/2023 Slight (A)  Not Present Final

## 2023-07-24 NOTE — Unmapped (Signed)
You are doing well!   Proceed with the next cycle of treatment  Vincristine today  Prednisone daily for 5 days  Ponatinib and aspirin daily.    The BCR/ABL should be collected in infusion.    I will see you back in 4 weeks

## 2023-07-25 DIAGNOSIS — C9100 Acute lymphoblastic leukemia not having achieved remission: Principal | ICD-10-CM

## 2023-07-27 DIAGNOSIS — C9100 Acute lymphoblastic leukemia not having achieved remission: Principal | ICD-10-CM

## 2023-07-28 DIAGNOSIS — C9100 Acute lymphoblastic leukemia not having achieved remission: Principal | ICD-10-CM

## 2023-07-28 MED ORDER — VALACYCLOVIR 500 MG TABLET
ORAL_TABLET | Freq: Every day | ORAL | 12 refills | 30.00 days | Status: CP
Start: 2023-07-28 — End: 2024-08-21

## 2023-07-30 DIAGNOSIS — C9100 Acute lymphoblastic leukemia not having achieved remission: Principal | ICD-10-CM

## 2023-07-30 MED ORDER — ESCITALOPRAM 10 MG TABLET
ORAL_TABLET | Freq: Every day | ORAL | 2 refills | 90.00 days | Status: CP
Start: 2023-07-30 — End: ?

## 2023-08-03 DIAGNOSIS — J321 Chronic frontal sinusitis: Principal | ICD-10-CM

## 2023-08-03 DIAGNOSIS — C9100 Acute lymphoblastic leukemia not having achieved remission: Principal | ICD-10-CM

## 2023-08-03 MED ORDER — AMOXICILLIN 875 MG-POTASSIUM CLAVULANATE 125 MG TABLET
ORAL_TABLET | Freq: Two times a day (BID) | ORAL | 0 refills | 10.00 days | Status: CP
Start: 2023-08-03 — End: 2023-08-13

## 2023-08-03 NOTE — Unmapped (Signed)
[facility] Home Delivery Pharm[facility] Refill Coordination Note    Patricia [name], DOB: 09-12-1975  Phone: [phone] (home)       All above HIPAA information was verified with patient.         08/03/2023    11:19 AM   Specialty Rx Medication Refill Questionnaire   Which Medications would you like refilled and shipped? Iclusig   Please list all current allergies: No changes   Have you missed any doses in the last 30 days? No   Have you had any changes to your medication(s) since your last refill? No   How many days remaining of each medication do you have at home? 7   Have you experienced any side effects in the last 30 days? No   Please enter the full address (street address, city, state, zip code) where you would like your medication(s) to be delivered to. [address], [place] [address]   Please specify on which day you would like your medication(s) to arrive. Note: if you need your medication(s) within 3 days, please call the pharmacy to schedule your order at [phone]  08/10/2023   Has your insurance changed since your last refill? No   Would you like a pharmacist to call you to discuss your medication(s)? No   Do you require a signature for your package? (Note: if we are billing Medicare Part B or your order contains a controlled substance, we will require a signature) No         Completed refill call assessment today to schedule patient's medication shipment from the [facility] [phone]).  All relevant notes have been reviewed.       Confirmed patient received a Conservation officer, historic buildings and a Surveyor, mining with first shipment. The patient will receive a drug information handout for each medication shipped and additional FDA Medication Guides as required.         REFERRAL TO PHARMACIST     Referral to the pharmacist: Not needed      [facility]     Shipping address confirmed in Epic.     Delivery Scheduled: Yes, Expected medication delivery date: 08/10/23. Medication will be delivered via Next Day Courier to the prescription address in Epic WAM.    [facility] Specialty Technician

## 2023-08-04 ENCOUNTER — Encounter: Admit: 2023-08-04 | Discharge: 2023-08-05 | Payer: PRIVATE HEALTH INSURANCE

## 2023-08-04 MED ADMIN — diphenhydrAMINE (BENADRYL) capsule/tablet 25 mg: 25 mg | ORAL | @ 16:00:00 | Stop: 2023-08-04

## 2023-08-04 MED ADMIN — immun glob G(IgG)-pro-IgA 0-50 (PRIVIGEN) 10 % intravenous solution 30 g: .4 g/kg | INTRAVENOUS | @ 16:00:00 | Stop: 2023-08-04

## 2023-08-04 MED ADMIN — acetaminophen (TYLENOL) tablet 650 mg: 650 mg | ORAL | @ 16:00:00 | Stop: 2023-08-04

## 2023-08-04 MED ADMIN — heparin, porcine (PF) 100 unit/mL injection 500 Units: 500 [IU] | INTRAVENOUS | @ 18:00:00 | Stop: 2023-08-04

## 2023-08-04 NOTE — Unmapped (Signed)
Pt presents for IVIG.  VSS, pt weight is 91.6kg.  Provider, Teresita Madura  notified by K. Briesemeister, RN regarding patient's report of having sinus infection. Per [name] ok to proceed with infusion.     Premeds administered. Medial right chest port accessed with 20g 3/4 huber needle, premeds administered.  Pt aware of potential reaction/side effects, call bell within reach.     1112 IVIG 30g started, to infuse at the following rates:    28 ml/hr for 15 min  92 ml/hr for 30 min  183 ml/hr for 30 min  440  ml/hr until complete.    1253 IVIG complete.  Pt tolerated infusion without any adverse reactions.   Port flushed, heparin locked and de-accessed per policy.  Pt left clinic in no acute distress.

## 2023-08-08 ENCOUNTER — Encounter
Admit: 2023-08-08 | Discharge: 2023-08-09 | Payer: PRIVATE HEALTH INSURANCE | Attending: Student in an Organized Health Care Education/Training Program | Primary: Student in an Organized Health Care Education/Training Program

## 2023-08-08 DIAGNOSIS — G893 Neoplasm related pain (acute) (chronic): Principal | ICD-10-CM

## 2023-08-08 DIAGNOSIS — R530 Neoplastic (malignant) related fatigue: Principal | ICD-10-CM

## 2023-08-08 DIAGNOSIS — C9100 Acute lymphoblastic leukemia not having achieved remission: Principal | ICD-10-CM

## 2023-08-08 DIAGNOSIS — Z515 Encounter for palliative care: Principal | ICD-10-CM

## 2023-08-08 DIAGNOSIS — T451X5A Adverse effect of antineoplastic and immunosuppressive drugs, initial encounter: Principal | ICD-10-CM

## 2023-08-08 DIAGNOSIS — F419 Anxiety disorder, unspecified: Principal | ICD-10-CM

## 2023-08-08 DIAGNOSIS — G620 Drug-induced polyneuropathy: Principal | ICD-10-CM

## 2023-08-08 NOTE — Unmapped (Signed)
OUTPATIENT ONCOLOGY PALLIATIVE CARE    Principal Diagnosis: Ms. 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    Assessment/Plan:     #Anxiety/Coping Support: Stable overall, with occasional flare ups. Most recently from slightly elevated BCR-ABL numbers. Uses excellent coping strategies. Briefly stopped lexapro but then restarted when noticed worsening ability to cope and more frequent emotional breakdowns. Since restarting she is doing well on her current dose and reports no recent panic attacks. Is planning to re-establish with prior therapist. Does note some undesired sexual side effects that she is considering discussing with gyn alternative management approaches.   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   - discussed trial of capsaicin cream which patient will try  - She also is working on an herbal salve and will send ingredients  - could also roate from lexapro to duloxetine in the future    #Cancer-Related Fatigue: Mild. Would like benefit from increased exercise.   - CTM  - Encouraged light exercise as has not engaged in this as much lately  - Future Considerations: American Ginseng     #Neutropenic fever: Currently resolved. ANC now >1. Now being treated for CMV viremia as well.  - Cont tylenol 650mg  TID PRN  - Cont letermovir    #Hemorrhoids: Improved and stable.   - Continue OTC topical treatments for now  - Defer to onc regarding if/when she would be a candidate for definitive surgical management if they persist    #Blurred Vision: ongoing- missed her recent eye appt.   - Planning to get re-scheduled    #Cancer-Related Pain: Improved and stable. Muscle aches intermittnetly. Responsive to tylenol and tizanidine which she takes very intermittently.   - Cont tylenol PRN  - Cont tizandine PRN - okay to take up to 8mg  in one dose    #Advanced Care Planning: Worries about recurrence, but uses coping strategies to manage.    - She and her husband have an excellent understanding of the current course of her treatment.  She recognizes that she is responding well and there is some uncertainty about how many rounds of treatment she will have to go through.  Has heard that the goal of treatment is curative, and she remains very hopeful for this.  Has been able to tolerate treatment fairly well to this point. Her major concern is about side effects from lumbar punctures.  Current focus is on cancer treatment and survival while maintaining quality of life is much as possible.    Health Care Decision Maker as of 08/08/2023    HCDM (patient stated preference): [name] Spouse - [phone]      # Controlled substances risk management.   - Patient does not have a signed pain medication agreement with our team.  No controlled substances being prescribed by our team currently.    - [place] database was reviewed today and it was appropriate.   - Urine drug screen was not performed at this visit. Findings: not applicable.   - Patient has received information about safe storage and administration of medications.   - Patient has not received a prescription  for narcan; is not applicable.       F/u: 12 weeks    ----------------------------------------  Referring Provider: From inpatient palliative care team  Oncology Team: Malignant hematology  PCP: [name], MD    Interval Hx, 08/08/23:    No major issues since our last visit. BCR-ABL count 2/100,000 last two checks rather than <1, but this was not concerning per oncology and plan has remained unchanged. BMBx in March.     Today, she reports she is doing okay. Had a nice visit with her parents over Christmas.     She is feeling nervous about the 2/100,000 BCR-ABL in last two checks. KNows that oncology said this is normal, but cannot help worrying. Feeling stressed about her nurse navigator leaving as they were close and helped when she was worried about things. But also acknowledges her improved coping skills.     On ROS, she feels like peripheral neuropathy is a little worse. Still tolerable, but more noticeable. More numbness than pain. Feels related to the cold. She wants to try making her own ointment to help with the neuropathy.     SHe is also dealing with her second sinus infection in the last few months. This has been a chronic issue for her. It is responding to antibiotics, but now dealing with post-nasal drip/cough as it resolved.     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     No other major concerns. Has felt a little isolated by the cold weather. Not able to get out in yard as much and not as many craft shows to go to. Feels reassured that disability approved until May, but worried about financial stability after that. Has been looking for remote jobs, but hasn't been able to find one.     Still has days where she feels more exhausted/less able to concentrate and has to take time off from what she would be doing otherwise.     Has mild back pain, but nothing major. No n/v. No constipation. No dyspnea.     Not doing much exercise. Considers using exercise bike and wlaking outside, but hasn't done as much.     HPI: Ph+ ALL, currently in remission.    Current cancer-directed therapy: maintenance vincristine 2g, Prednisone 200mg  D1-4 +dasatinib       Palliative Performance Scale: 90% - Ambulation: Full / Normal Activity, some evidence of disease / Self-Care:Full / Intake: Normal / Level of Conscious: Full      Coping/Support Issues: She and her husband have multiple stressors but overall feels like she is coping fairly well right now.  She will reach out to our palliative care team if new concerns emerge.    Goals of Care:  Focus on cancer directed therapy and survival while hoping to maintain her quality of life is much as possible    Social History:   Name of primary support: Husband Anette Riedel  occupation: 6 grade teacher      Advance Care Planning:   HCPOA: Spouse  Natural surrogate decision maker: Husband [name]  Living Will: no  ACP note:     Objective       Hematology/Oncology History Overview Note   Referring/Local Oncologist:    Diagnosis:   10/29/2021  Bone marrow, left iliac, aspiration and biopsy  -  Hypercellular bone marrow (greater than 90%) involved by B lymphoblastic leukemia (~95%blasts by morphologic assessment of aspirate  smears and touch preps)  - Abnormal Karyotype:  47,XX,t(9;22)(q34;q11.2),+der(22)t(9;22)[18]/46,XX[2]     Abnormal FISH:  A BCR/ABL1 interphase FISH assay showed a signal pattern consistent with a BCR::ABL1 rearrangement and the 9;22 translocation in 88% of the 100 cells scored. The majority of the abnormal cells (64/88) contained an additional BCR/ABL1 fusion signal, while 9/88 abnormal cells contained an additional ABL1 and ASS1 signal.       Genetics:   Karyotype/FISH:   RESULTS   Date Value Ref Range Status   10/28/2021   Final    NOTE: This report reflects a combined study from a peripheral blood and a bone marrow core biopsy. Eleven cells from the peripheral blood and nine cells from the bone marrow core biopsy were analyzed. The BCR/ABL1 FISH analysis was performed on the peripheral blood.     Abnormal Karyotype:  47,XX,t(9;22)(q34;q11.2),+der(22)t(9;22)[18]/46,XX[2]    Abnormal FISH:  A BCR/ABL1 interphase FISH assay showed a signal pattern consistent with a BCR::ABL1 rearrangement and the 9;22 translocation in 88% of the 100 cells scored. The majority of the abnormal cells (64/88) contained an additional BCR/ABL1 fusion signal, while 9/88 abnormal cells contained an additional ABL1 and ASS1 signal.           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  MRD by flow insufficient, p190 2/100,000  12/10/2021: Cycle 2 B cycle + rituximab  12/14/21: IT#4  12/17/21: IT#5  01/10/22: Cycle 3 A cycle + rituximab  01/11/22: IT #6  01/24/22: Bmbx - MRD-neg by PCR (p190)   02/17/22: C4 B cycle + ritux  02/18/22: IT#7  03/21/22: C5 A cycle + ritux (4th dose)  03/22/22: IT#8  04/29/22: Bmbx - MRD-neg by PCR (p190)   05/18/22: restart dasatinib 70mg  s/p neutropenia  05/26/22: Start maintenance vincristine 2mg , Prednisone 200mg  D1-5   06/02/22: IT #9  06/27/22: C2 Maintenance: vincristine 2mg , Prednisone 200mg  D1-5, HOLD dasatinib due to neutropenia  07/04/22: RESTART dasatinib 70mg   07/21/22: IT #10  07/26/22: C3 Maintenance: vincristine 2mg , Prednisone 200mg  D1-5, dasatinib 70 mg  08/08/22: IT #11 - prophylaxis completed  08/23/22: C4 Maintenance: vincristine 2mg , Prednisone 200mg  D1-5, dasatinib 70 mg  09/20/22: C5 Maintenance: vincristine 2mg , Prednisone 200mg  D1-5, dasatinib 70 mg  10/04/22: Bone marrow biopsy - continued remission, MRD and BCR/ABL p190 negative  10/18/22: C6 Maintenance. ANC 0.4, HOLD dasatinib, continue vincristine 2mg  and Prednisone 200mg  D1-5  11/15/22: C7 Maintenance ANC 0.4. HOLD TKI, continue vincristine 2mg  and Prednisone 200mg  D1-5  ~11/30/22: Start ponatinib 15 mg daily  12/13/22: C8 Maintenance ANC 1.2. ponatinib 15mg , vincristine 2mg  D1 and Prednisone 200mg  D1-5  01/10/23: C9 Maintenance Anc 0.7. HOLD ponatinib, continue vincristine 2mg  D1 and Prednisone 200mg  D1-5  01/26/23: ANC 1.7, restart ponatinib 15 mg  02/08/23: C10 Maintenance. ANC is 1.4 - continue ponatinib  03/07/23: C11 Maintenance ANC is 1.1. continue ponatinib  04/06/23: C12 Maintenance  04/12/23: Bmbx - continued remission, MRD-negative, BCR/ABL undetectable  05/02/23: C13 Maintenance  05/30/23: C14 Maintenance  06/27/23: C15 Maintenance PB 210 2/100,000  07/23/23: C16 Maintenance       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)   10/29/2021 Initial Diagnosis    B-cell acute lymphoblastic  leukemia (ALL) (CMS-HCC)     10/30/2021 - 03/30/2022 Chemotherapy    IP/OP LEUKEMIA GRAAPH-2005 < 60 YO (OP PEGFILGRASTIM ON DAY 7)  [No description for this plan]     01/06/2022 - 04/07/2022 Endocrine/Hormone Therapy    OP LEUPROLIDE (LUPRON) 11.25 MG EVERY 3 MONTHS  Plan Provider: [name], MD     05/26/2022 -  Chemotherapy    OP LEUKEMIA VINCRISTINE  vinCRIStine 2 mg IV on day 1     07/26/2022 - 07/26/2022 Endocrine/Hormone Therapy    OP LEUPROLIDE (LUPRON) 11.25 MG EVERY 3 MONTHS  Plan Provider: [name], MD           REVIEW OF SYSTEMS:  A comprehensive review of 10 systems was negative except for pertinent positives noted in HPI.      PHYSICAL EXAM: Video visit  There were no vitals filed for this visit.    GEN: Awake and alert, pleasant appearing female in no acute distress. Hair is growing back and in pig tails  LUNGS: No increased work of breathing on room air, talking in full sentences.  PSYCH: Alert and oriented to person, place and time. Excited when talking about her projects.  SKIN: No rashes, petechiae or jaundice noted        The patient reports they are physically located in [place] and is currently: at home. I conducted a audio/video visit. I spent  75m 24s on the video call with the patient. I spent an additional 16 minutes on pre- and post-visit activities on the date of service .     Redgie Grayer, MD  [facility]

## 2023-08-09 DIAGNOSIS — C9100 Acute lymphoblastic leukemia not having achieved remission: Principal | ICD-10-CM

## 2023-08-10 DIAGNOSIS — C9100 Acute lymphoblastic leukemia not having achieved remission: Principal | ICD-10-CM

## 2023-08-10 NOTE — Unmapped (Signed)
Patricia [name] 's ICLUSIG 15 mg tablet (PONATinib) shipment will be rescheduled as a result of copay is now approved by patient/caregiver.      I have spoken with the patient  at [phone]  and communicated the delivery change. We will reschedule the medication for the delivery date that the patient agreed upon.  We have confirmed the delivery date as 08/11/23, via same day courier.

## 2023-08-11 MED FILL — ICLUSIG 15 MG TABLET: ORAL | 30 days supply | Qty: 30 | Fill #2

## 2023-08-21 DIAGNOSIS — C9100 Acute lymphoblastic leukemia not having achieved remission: Principal | ICD-10-CM

## 2023-08-21 NOTE — Unmapped (Signed)
[facility] Follow Up Visit Note     [name]  Patient Age: 48 y.o.  Encounter Date: 08/22/2023    Primary Care Provider:  [name], MD    Referring Physician:  Lenon Ahmadi, AGNP  [address]  [id number]  [place],  [place] [address]    Cancer Diagnosis: Ph+ B-ALL; Initial Dx 10/29/2021  Cancer status: CR1, BCR/ABL p190 negative in marrow  Treatment Regimen: First Line, ponatinib + Maintenance (vincristine, prednisone) s/p truncated course of GRAAPH-2005, HyperCVAD  Treatment Goal: Curative  Comorbidities: HTN, anxiety, hemorrhoids with anal fissures  Transplant: referral deferred in CR1    Assessment/Plan:  Patricia [name] is a 48 y.o. female with past medical history of chronic right shoulder and back pain, hypertension, anxiety, and recently diagnosed Ph+ ALL. 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  Given her deep molecular remission and her intolerance of hyperCVAD, we are transitioned to maintenance chemotherapy.     She is now s/p Cycle 16 of Maintenance ponatinib + vincristine + prednisone. Her cell counts remain stable and she is doing well overall. BCR/ABL 2/100,000. Discussed that we don't recommend any changes if the level is in the single digits.     She had a likely sinus infection but it resolved s/p a course of Augmentin. Will proceed without changes.    Continue monthly IVIG on a separate date due to insurance.       Ph+ALL, in remission: On GRAAPH 2005 + dasatinib. Completed 12/12 planned IT treatments for CNS ppx.  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 09/2022 restarted Valcyte with the goal of controlling CMV and limiting neutropenia  - continue letermovir  - d/c CMV levels as of 03/2023 - per Dr. Kari Baars    Hypogammaglobulinemia:   - Monthly IVIG started 07/26/22 - now at [facility] due to insurance coverage    Left knee pain  - 08/22/23 - will refer to orthopedics is pain persists/worsens    Hemorrhoids, Anal fissure: persistent but stable, per patient  - supportive care at home    Pancytopenia due to chemotherapy  - ANC and Platelets sufficient to proceed without changes 08/22/2023  - supportive care as detailed below when in nadir    Menses suppression and birth control: previously received Lupron with last dose 07/26/22   - patient stopped oral contraceptive due to weight loss. Counseled on importance of birth control on TKI. She is abstinent and making an appointment with GYN       Psychosocial distress: She reports a moderate level of anxiety regarding the management of the above.   - Counseling given   - Consider ref to comprehensive cancer support program     Patient-centered care/Shared decision-making:   We discussed the plan above at length. The patient and her husband actively contributed to the conversation. Specifically, her  most important outcomes are:   Prolonging overall survival and Maintaining her overall functional activity     Supportive Care Needs: We recommend based on the patient???s underlying diagnosis and treatment history the following supportive care:    1. Antimicrobial prophylaxis:    Viral - valacyclovir and letermovir  Bacterial: Levofloxacin when ANC <0.5  Fungal: Fluconazole 200mg  when ANC <0.5  PJP: dapsone 100mg  daily    2. Blood product support:  Leukoreduced blood products are required.  Irradiated blood products are preferred, but in case of urgent transfusion needs non-irradiated blood products may be used: 2 units for Hg <=8.0.     Coordination of care:   - Continue maintenance q4 weeks    I personally spent 40 minutes face-to-face and non-face-to-face in the care of this patient, which includes all pre, intra, and post visit time on the date of service.  All documented time was specific to the E/M visit and does not include any procedures that may have been performed.      [name], AGPCNP-BC  Nurse Practitioner  Hematology/Oncology  [facility]      [name], MD was available  Leukemia Program  Division of Hematology  [facility]      Nurse Navigator (non-clinical trial patients): Elicia Lamp, RN        Tel. [phone]       Fax. [phone]  Toll-free appointments: [phone]  Scheduling assistance: [phone]  After hours/weekends: [phone] (ask for adult hematology/oncology on-call)      History of Present Illness:   Patricia [name] is a 48 y.o. female with past medical history noted as above who presents for follow up Ph+ ALL.      Her social history is notable for being a Runner, broadcasting/film/video.  She was recently married. She lives with her husband.    Interim history  Since her last visit,    A couple of issues  - had symptoms concerning for another sinus infection. Resolved after a couplrse of Augmentin.   - hurt her right ankle - rolled it. Both of her ankles are easy to turn. She is unsure if decreased sensitivity is related  - She has a bruise-like sensitivity in her left knee. It has been present for some time now. No actual bruise. Kneeling can cause shootining pains. Folding her left knee to sit on it, or to get onto her bed, can hurt her knee too.  No other concerns.          Oncology History is as below:   Hematology/Oncology History Overview Note   Referring/Local Oncologist:    Diagnosis:   10/29/2021  Bone marrow, left iliac, aspiration and biopsy  -  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 The majority of the abnormal cells (64/88) contained an additional BCR/ABL1 fusion signal, while 9/88 abnormal cells contained an additional ABL1 and ASS1 signal.       Genetics:   Karyotype/FISH:   RESULTS   Date Value Ref Range Status   10/28/2021   Final    NOTE: This report reflects a combined study from a peripheral blood and a bone marrow core biopsy. Eleven cells from the peripheral blood and  nine cells from the bone marrow core biopsy were analyzed. The BCR/ABL1 FISH analysis was performed on the peripheral blood.     Abnormal Karyotype:  47,XX,t(9;22)(q34;q11.2),+der(22)t(9;22)[18]/46,XX[2]    Abnormal FISH:  A BCR/ABL1 interphase FISH assay showed a signal pattern consistent with a BCR::ABL1 rearrangement and the 9;22 translocation in 88% of the 100 cells scored. The majority of the abnormal cells (64/88) contained an additional BCR/ABL1 fusion signal, while 9/88 abnormal cells contained an additional ABL1 and ASS1 signal.           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  MRD by flow insufficient, p190 2/100,000  12/10/2021: Cycle 2 B cycle + rituximab  12/14/21: IT#4  12/17/21: IT#5  01/10/22: Cycle 3 A cycle + rituximab  01/11/22: IT #6  01/24/22: Bmbx - MRD-neg by PCR (p190)   02/17/22: C4 B cycle + ritux  02/18/22: IT#7  03/21/22: C5 A cycle + ritux (4th dose)  03/22/22: IT#8  04/29/22: Bmbx - MRD-neg by PCR (p190)   05/18/22: restart dasatinib 70mg  s/p neutropenia  05/26/22: Start maintenance vincristine 2mg , Prednisone 200mg  D1-5   06/02/22: IT #9  06/27/22: C2 Maintenance: vincristine 2mg , Prednisone 200mg  D1-5, HOLD dasatinib due to neutropenia  07/04/22: RESTART dasatinib 70mg   07/21/22: IT #10  07/26/22: C3 Maintenance: vincristine 2mg , Prednisone 200mg  D1-5, dasatinib 70 mg  08/08/22: IT #11 - prophylaxis completed  08/23/22: C4 Maintenance: vincristine 2mg , Prednisone 200mg  D1-5, dasatinib 70 mg  09/20/22: C5 Maintenance: vincristine 2mg , Prednisone 200mg  D1-5, dasatinib 70 mg  10/04/22: Bone marrow biopsy - continued remission, MRD and BCR/ABL p190 negative  10/18/22: C6 Maintenance. ANC 0.4, HOLD dasatinib, continue vincristine 2mg  and Prednisone 200mg  D1-5  11/15/22: C7 Maintenance ANC 0.4. HOLD TKI, continue vincristine 2mg  and Prednisone 200mg  D1-5  ~11/30/22: Start ponatinib 15 mg daily  12/13/22: C8 Maintenance ANC 1.2. ponatinib 15mg , vincristine 2mg  D1 and Prednisone 200mg  D1-5  01/10/23: C9 Maintenance Anc 0.7. HOLD ponatinib, continue vincristine 2mg  D1 and Prednisone 200mg  D1-5  01/26/23: ANC 1.7, restart ponatinib 15 mg  02/08/23: C10 Maintenance. ANC is 1.4 - continue ponatinib  03/07/23: C11 Maintenance ANC is 1.1. continue ponatinib  04/06/23: C12 Maintenance  04/12/23: Bmbx - continued remission, MRD-negative, BCR/ABL undetectable  05/02/23: C13 Maintenance  05/30/23: C14 Maintenance  06/27/23: C15 Maintenance PB 210 2/100,000  07/23/23: C16 Maintenance           Review of Systems:   ROS reviewed and negative except as noted in H and P     Allergies:  Allergies   Allergen Reactions    Erythromycin Hives     Other reaction(s): Not available    Other      Pt cant take ibuprofen due to condition.    Sulfa (Sulfonamide Antibiotics) Anaphylaxis     Other reaction(s): Not available    Azithromycin Hives     Per patient report       Medications:     Current Outpatient Medications:     acetaminophen (TYLENOL 8 HOUR) 650 MG CR tablet, Take 2 tablets (1,300 mg total) by mouth every eight (8) hours as needed for pain., Disp: , Rfl:     albuterol HFA  90 mcg/actuation inhaler, Inhale 2 puffs every six (6) hours as needed for wheezing., Disp: , Rfl:     aspirin (ECOTRIN) 81 MG tablet, Take 1 tablet (81 mg total) by mouth daily., Disp: , Rfl:     cetirizine (ZYRTEC) 10 MG tablet, Take 1 tablet (10 mg total) by mouth nightly., Disp: 90 tablet, Rfl: 3    dapsone 100 MG tablet, Take 1 tablet (100 mg total) by mouth daily. TAKE 1 TABLET(100 MG) BY MOUTH DAILY, Disp: 30 tablet, Rfl: 11    escitalopram oxalate (LEXAPRO) 10 MG tablet, Take 1 tablet (10 mg total) by mouth daily., Disp: 90 tablet, Rfl: 2    letermovir (PREVYMIS) 480 mg tablet, Take 1 tablet (480 mg total) by mouth daily., Disp: 28 tablet, Rfl: 5    levalbuterol (XOPENEX) 0.31 mg/3 mL nebulizer solution, Inhale 3 mL (0.31 mg total) by nebulization every four (4) hours as needed for wheezing (cough)., Disp: 45 mL, Rfl: 0    losartan (COZAAR) 50 MG tablet, Take 1 tablet (50 mg total) by mouth daily., Disp: 30 tablet, Rfl: 3    PONATinib (ICLUSIG) 15 mg tablet, Take 1 tablet (15 mg total) by mouth daily. Swallow tablets whole. Do not crush, break, cut or chew tablets., Disp: 30 tablet, Rfl: 5    predniSONE (DELTASONE) 50 MG tablet, Take 4 tablets (200 mg total) by mouth once daily on days 1-5 of each cycle., Disp: 20 tablet, Rfl: 5    rosuvastatin (CRESTOR) 10 MG tablet, Take 1 tablet (10 mg total) by mouth nightly., Disp: 90 tablet, Rfl: 3    valACYclovir (VALTREX) 500 MG tablet, Take 1 tablet (500 mg total) by mouth daily., Disp: 30 tablet, Rfl: 12    Medical History:  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    EBV infection        Social History:  Social History     Social History Narrative    Not on file       Family History:  No family history on file.    Objective:   There were no vitals taken for this visit.    Physical Exam:  GENERAL: well-appearing  woman, unaccompanied  HEART: Normal color, not excessive pallor.  CHEST/LUNG: Normal work of breathing. No dyspnea with conversation.   ABDOMEN: No obvious distention.    EXTREMITIES: No edema, cyanosis or clubbing.   SKIN: no rashes noted  NEURO EXAM: Grossly intact.       Test Results:  08/22/2023 BCR/ABL, CBC/d and CMP reviewed with patient

## 2023-08-22 ENCOUNTER — Other Ambulatory Visit: Admit: 2023-08-22 | Discharge: 2023-08-22 | Payer: PRIVATE HEALTH INSURANCE

## 2023-08-22 ENCOUNTER — Ambulatory Visit: Admit: 2023-08-22 | Discharge: 2023-08-22 | Payer: PRIVATE HEALTH INSURANCE

## 2023-08-22 ENCOUNTER — Ambulatory Visit
Admit: 2023-08-22 | Discharge: 2023-08-22 | Payer: PRIVATE HEALTH INSURANCE | Attending: Adult Health | Primary: Adult Health

## 2023-08-22 ENCOUNTER — Inpatient Hospital Stay: Admit: 2023-08-22 | Discharge: 2023-08-22 | Payer: PRIVATE HEALTH INSURANCE

## 2023-08-22 DIAGNOSIS — C9100 Acute lymphoblastic leukemia not having achieved remission: Principal | ICD-10-CM

## 2023-08-22 LAB — CBC W/ AUTO DIFF
BASOPHILS ABSOLUTE COUNT: 0 10*9/L (ref 0.0–0.1)
BASOPHILS RELATIVE PERCENT: 0.7 %
EOSINOPHILS ABSOLUTE COUNT: 0.2 10*9/L (ref 0.0–0.5)
EOSINOPHILS RELATIVE PERCENT: 3.4 %
HEMATOCRIT: 35.4 % (ref 34.0–44.0)
HEMOGLOBIN: 12.3 g/dL (ref 11.3–14.9)
LYMPHOCYTES ABSOLUTE COUNT: 1.7 10*9/L (ref 1.1–3.6)
LYMPHOCYTES RELATIVE PERCENT: 34.5 %
MEAN CORPUSCULAR HEMOGLOBIN CONC: 34.6 g/dL (ref 32.0–36.0)
MEAN CORPUSCULAR HEMOGLOBIN: 32.1 pg (ref 25.9–32.4)
MEAN CORPUSCULAR VOLUME: 92.8 fL (ref 77.6–95.7)
MEAN PLATELET VOLUME: 9.1 fL (ref 6.8–10.7)
MONOCYTES ABSOLUTE COUNT: 0.5 10*9/L (ref 0.3–0.8)
MONOCYTES RELATIVE PERCENT: 10.5 %
NEUTROPHILS ABSOLUTE COUNT: 2.5 10*9/L (ref 1.8–7.8)
NEUTROPHILS RELATIVE PERCENT: 50.9 %
PLATELET COUNT: 198 10*9/L (ref 150–450)
RED BLOOD CELL COUNT: 3.82 10*12/L — ABNORMAL LOW (ref 3.95–5.13)
RED CELL DISTRIBUTION WIDTH: 16.3 % — ABNORMAL HIGH (ref 12.2–15.2)
WBC ADJUSTED: 5 10*9/L (ref 3.6–11.2)

## 2023-08-22 LAB — COMPREHENSIVE METABOLIC PANEL
ALBUMIN: 4.3 g/dL (ref 3.4–5.0)
ALKALINE PHOSPHATASE: 75 U/L (ref 46–116)
ALT (SGPT): 53 U/L — ABNORMAL HIGH (ref 10–49)
ANION GAP: 11 mmol/L (ref 5–14)
AST (SGOT): 35 U/L — ABNORMAL HIGH (ref <=34)
BILIRUBIN TOTAL: 0.5 mg/dL (ref 0.3–1.2)
BLOOD UREA NITROGEN: 16 mg/dL (ref 9–23)
BUN / CREAT RATIO: 24
CALCIUM: 10.2 mg/dL (ref 8.7–10.4)
CHLORIDE: 102 mmol/L (ref 98–107)
CO2: 27 mmol/L (ref 20.0–31.0)
CREATININE: 0.66 mg/dL (ref 0.55–1.02)
EGFR CKD-EPI (2021) FEMALE: >90 mL/min/{1.73_m2} (ref >=60)
GLUCOSE RANDOM: 179 mg/dL (ref 70–179)
POTASSIUM: 4.4 mmol/L (ref 3.4–4.8)
PROTEIN TOTAL: 7.9 g/dL (ref 5.7–8.2)
SODIUM: 140 mmol/L (ref 135–145)

## 2023-08-22 MED ORDER — PREDNISONE 50 MG TABLET
ORAL_TABLET | 5 refills | 0.00 days | Status: CP
Start: 2023-08-22 — End: ?
  Filled 2023-08-22: qty 20, 5d supply, fill #0

## 2023-08-22 MED ADMIN — heparin, porcine (PF) 100 unit/mL injection 500 Units: 500 [IU] | INTRAVENOUS | @ 17:00:00 | Stop: 2023-08-23

## 2023-08-22 MED ADMIN — vinCRIStine (ONCOVIN) 2 mg in sodium chloride (NS) 0.9 % 25 mL IVPB: 2 mg | INTRAVENOUS | @ 17:00:00 | Stop: 2023-08-22

## 2023-08-22 MED FILL — PREVYMIS 480 MG TABLET: ORAL | 28 days supply | Qty: 28 | Fill #4

## 2023-08-22 NOTE — Unmapped (Signed)
Pt in chair 5, here for chemo   Port alreaedy accessed, flushes well, + blood return   Labs within range, pending meds from pharmacy     Tolerating infusion via port    + blood return post infusion  Heparin locked port  De accessed   AVS declined  Ambulatory for dc home

## 2023-08-22 NOTE — Unmapped (Signed)
RED ZONE Means: RED ZONE: Take action now!     You need to be seen right away  Symptoms are at a severe level of discomfort    Call 911 or go to your nearest  Hospital for help     - Bleeding that will not stop    - Hard to breathe    - New seizure - Chest pain  - Fall or passing out  -Thoughts of hurting    yourself or others      Call 911 if you are going into the RED ZONE                  YELLOW ZONE Means:     Please call with any new or worsening symptom(s), even if not on this list.  Call [phone]  After hours, weekends, and holidays - you will reach a long recording with specific instructions, If not in an emergency such as above, please listen closely all the way to the end and choose the option that relates to your need.   You can be seen by a provider the same day through our Same Day Acute Care for Patients with Cancer program.      YELLOW ZONE: Take action today     Symptoms are new or worsening  You are not within your goal range for:    - Pain    - Shortness of breath    - Bleeding (nose, urine, stool, wound)    - Feeling sick to your stomach and throwing up    - Mouth sores/pain in your mouth or throat    - Hard stool or very loose stools (increase in       ostomy output)    - No urine for 12 hours    - Feeding tube or other catheter/tube issue    - Redness or pain at previous IV or port/catheter site    - Depressed or anxiety   - Swelling (leg, arm, abdomen,     face, neck)  - Skin rash or skin changes  - Wound issues (redness, drainage,    re-opened)  - Confusion  - Vision changes  - Fever >100.4 F or chills  - Worsening cough with mucus that is    green, yellow, or bloody  - Pain or burning when going to the    bathroom  - Home Infusion Pump Issue- call    [phone]         Call your healthcare provider if you are going into the YELLOW ZONE     GREEN ZONE Means:  Your symptoms are under controls  Continue to take your medicine as ordered  Keep all visits to the provider GREEN ZONE: You are in control  No increase or worsening symptoms  Able to take your medicine  Able to drink and eat    - DO NOT use MyChart messages to report red or yellow symptoms. Allow up to 3    business days for a reply.  -MyChart is for non-urgent medication refills, scheduling requests, or other general questions.         [id number] Rev. 01/28/2022  Approved by Oncology Patient Education Committee     Lab on 08/22/2023   Component Date Value Ref Range Status    Sodium 08/22/2023 140  135 - 145 mmol/L Final    Potassium 08/22/2023 4.4  3.4 - 4.8 mmol/L Final    Chloride 08/22/2023 102  98 - 107 mmol/L Final  CO2 08/22/2023 27.0  20.0 - 31.0 mmol/L Final    Anion Gap 08/22/2023 11  5 - 14 mmol/L Final    BUN 08/22/2023 16  9 - 23 mg/dL Final    Creatinine 09/81/1914 0.66  0.55 - 1.02 mg/dL Final    BUN/Creatinine Ratio 08/22/2023 24   Final    eGFR CKD-EPI (2021) Female 08/22/2023 >90  >=60 mL/min/1.61m2 Final    eGFR calculated with CKD-EPI 2021 equation in accordance with [employer] and [employer] of Nephrology Task Force recommendations.    Glucose 08/22/2023 179  70 - 179 mg/dL Final    Calcium 78/29/5621 10.2  8.7 - 10.4 mg/dL Final    Albumin 30/86/5784 4.3  3.4 - 5.0 g/dL Final    Total Protein 08/22/2023 7.9  5.7 - 8.2 g/dL Final    Total Bilirubin 08/22/2023 0.5  0.3 - 1.2 mg/dL Final    AST 69/62/9528 35 (H)  <=34 U/L Final    ALT 08/22/2023 53 (H)  10 - 49 U/L Final    Alkaline Phosphatase 08/22/2023 75  46 - 116 U/L Final    WBC 08/22/2023 5.0  3.6 - 11.2 10*9/L Final    RBC 08/22/2023 3.82 (L)  3.95 - 5.13 10*12/L Final    HGB 08/22/2023 12.3  11.3 - 14.9 g/dL Final    HCT 41/32/4401 35.4  34.0 - 44.0 % Final    MCV 08/22/2023 92.8  77.6 - 95.7 fL Final    MCH 08/22/2023 32.1  25.9 - 32.4 pg Final    MCHC 08/22/2023 34.6  32.0 - 36.0 g/dL Final    RDW 02/72/5366 16.3 (H)  12.2 - 15.2 % Final    MPV 08/22/2023 9.1  6.8 - 10.7 fL Final    Platelet 08/22/2023 198  150 - 450 10*9/L Final Neutrophils % 08/22/2023 50.9  % Final    Lymphocytes % 08/22/2023 34.5  % Final    Monocytes % 08/22/2023 10.5  % Final    Eosinophils % 08/22/2023 3.4  % Final    Basophils % 08/22/2023 0.7  % Final    Absolute Neutrophils 08/22/2023 2.5  1.8 - 7.8 10*9/L Final    Absolute Lymphocytes 08/22/2023 1.7  1.1 - 3.6 10*9/L Final    Absolute Monocytes 08/22/2023 0.5  0.3 - 0.8 10*9/L Final    Absolute Eosinophils 08/22/2023 0.2  0.0 - 0.5 10*9/L Final    Absolute Basophils 08/22/2023 0.0  0.0 - 0.1 10*9/L Final    Anisocytosis 08/22/2023 Slight (A)  Not Present Final

## 2023-08-22 NOTE — Unmapped (Addendum)
You are doing well! Proceed with the next cycle.  Prednisone daily x 5 days.  Vincristine today  Ponatinb and aspirin daily.    I will see you back in 4 weeks.

## 2023-08-23 DIAGNOSIS — C9100 Acute lymphoblastic leukemia not having achieved remission: Principal | ICD-10-CM

## 2023-08-29 DIAGNOSIS — C9100 Acute lymphoblastic leukemia not having achieved remission: Principal | ICD-10-CM

## 2023-08-30 ENCOUNTER — Encounter: Admit: 2023-08-30 | Discharge: 2023-08-31 | Payer: PRIVATE HEALTH INSURANCE

## 2023-08-30 MED ADMIN — acetaminophen (TYLENOL) tablet 650 mg: 650 mg | ORAL | @ 15:00:00 | Stop: 2023-08-30

## 2023-08-30 MED ADMIN — heparin, porcine (PF) 100 unit/mL injection 500 Units: 500 [IU] | INTRAVENOUS | @ 17:00:00 | Stop: 2023-08-30

## 2023-08-30 MED ADMIN — immun glob G(IgG)-pro-IgA 0-50 (PRIVIGEN) 10 % intravenous solution 30 g: .4 g/kg | INTRAVENOUS | @ 15:00:00 | Stop: 2023-08-30

## 2023-08-30 MED ADMIN — diphenhydrAMINE (BENADRYL) capsule/tablet 25 mg: 25 mg | ORAL | @ 15:00:00 | Stop: 2023-08-30

## 2023-08-30 NOTE — Unmapped (Signed)
Pt presents for IVIG.  VSS, pt weight is 92 kg. Right chest lateral port accessed as per protocol with brisk return of blood . Patient made aware for potential reaction/side effects, call bell within reach.      1026 IVIG 30 g started, to infuse at the following rates:    27 ml/hr for 15 min  92 ml/hr for 30 min  184 ml/hr for 30 min  441 ml/hr until complete.  1206 IVIG complete.  Pt tolerated without complication, VSS.  Right chest port lateral flushed and heparinized as per protocol . Patient tolerated well without complicationsIV flushed per policy .  Pt left clinic in no acute distress.

## 2023-09-11 DIAGNOSIS — C9100 Acute lymphoblastic leukemia not having achieved remission: Principal | ICD-10-CM

## 2023-09-11 NOTE — Unmapped (Signed)
[facility] Delivery Pharm[facility] Refill Coordination Note    Patricia [name], DOB: 03/05/1976  Phone: [phone] (home)       All above HIPAA information was verified with patient.         09/10/2023     8:54 AM   Specialty Rx Medication Refill Questionnaire   Which Medications would you like refilled and shipped? Iclusig   Please list all current allergies: No change   Have you missed any doses in the last 30 days? No   Have you had any changes to your medication(s) since your last refill? No   How many days remaining of each medication do you have at home? 7   Have you experienced any side effects in the last 30 days? No   Please enter the full address (street address, city, state, zip code) where you would like your medication(s) to be delivered to. [address], [place] [address]   Please specify on which day you would like your medication(s) to arrive. Note: if you need your medication(s) within 3 days, please call the pharmacy to schedule your order at [phone]  09/13/2023   Has your insurance changed since your last refill? No   Would you like a pharmacist to call you to discuss your medication(s)? No   Do you require a signature for your package? (Note: if we are billing Medicare Part B or your order contains a controlled substance, we will require a signature) No         Completed refill call assessment today to schedule patient's medication shipment from the [facility] [phone]).  All relevant notes have been reviewed.       Confirmed patient received a Conservation officer, historic buildings and a Surveyor, mining with first shipment. The patient will receive a drug information handout for each medication shipped and additional FDA Medication Guides as required.         REFERRAL TO PHARMACIST     Referral to the pharmacist: Not needed      [facility]     Shipping address confirmed in Epic.     Delivery Scheduled: Yes, Expected medication delivery date: 09/13/23. Medication will be delivered via Next Day Courier to the prescription address in Epic [place].    Tewana Bohlen [name]   [facility] Delivery Pharmacy Specialty Technician

## 2023-09-12 MED FILL — ICLUSIG 15 MG TABLET: ORAL | 30 days supply | Qty: 30 | Fill #3

## 2023-09-14 DIAGNOSIS — J329 Chronic sinusitis, unspecified: Principal | ICD-10-CM

## 2023-09-14 DIAGNOSIS — C9100 Acute lymphoblastic leukemia not having achieved remission: Principal | ICD-10-CM

## 2023-09-14 MED ORDER — AMOXICILLIN 875 MG-POTASSIUM CLAVULANATE 125 MG TABLET
ORAL_TABLET | Freq: Two times a day (BID) | ORAL | 0 refills | 14.00 days | Status: CP
Start: 2023-09-14 — End: 2023-09-28

## 2023-09-18 DIAGNOSIS — C9100 Acute lymphoblastic leukemia not having achieved remission: Principal | ICD-10-CM

## 2023-09-18 NOTE — Unmapped (Signed)
 [facility]ukemia [facility] Follow Up Visit Note     [name]  Patient Age: 48 y.o.  Encounter Date: 09/19/2023    Primary Care Provider:  [name], MD    Referring Physician:  Lenon Ahmadi, AGNP  [address]  [id number]  [place],  [place] [address]    Cancer Diagnosis: Ph+ B-ALL; Initial Dx 10/29/2021  Cancer status: CR1, BCR/ABL p190 negative in marrow  Treatment Regimen: First Line, ponatinib + Maintenance (vincristine, prednisone) s/p truncated course of GRAAPH-2005, HyperCVAD  Treatment Goal: Curative  Comorbidities: HTN, anxiety, hemorrhoids with anal fissures  Transplant: referral deferred in CR1    Assessment/Plan:  Patricia [name] is a 48 y.o. female with past medical history of chronic right shoulder and back pain, hypertension, anxiety, and recently diagnosed Ph+ ALL. 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  Given her deep molecular remission and her intolerance of hyperCVAD, we are transitioned to maintenance chemotherapy.     She is now s/p Cycle 17 of Maintenance ponatinib + vincristine + prednisone. Her cell counts remain stable and she is doing well overall. BCR/ABL 4/100,000. Will proceed without changes. She is due for a bmbx next cycle.  Discussed elevated temps at home (mostly mid-99's F and before sinus symptoms and without other symptoms. Checked CMV today, which is negative. Discussed that as long as she feels well and temps <100.4, will monitor. She will complete the full 10 day course of Augmentin.     Continue monthly IVIG on a separate date due to insurance.   She is clear to see a dentist for routine cleaning/care.    Ph+ALL, in remission: On GRAAPH 2005 + dasatinib. Completed 12/12 planned IT treatments for CNS ppx.  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 09/2022 restarted Valcyte with the goal of controlling CMV and limiting neutropenia  - continue letermovir  - d/c CMV levels as of 03/2023 - per Dr. Kari Baars  - 09/19/23 - CMV negative - checked due to elevated temps    Hypogammaglobulinemia:   - Monthly IVIG started 07/26/22 - now at [facility] due to insurance coverage    Left knee pain  - 08/22/23 - will refer to orthopedics is pain persists/worsens    Hemorrhoids, Anal fissure: persistent but stable, per patient  - supportive care at home    Menses suppression and birth control: previously received Lupron with last dose 07/26/22   - patient stopped oral contraceptive due to weight loss. Counseled on importance of birth control on TKI. She is abstinent and making an appointment with GYN       Psychosocial distress: She reports a moderate level of anxiety regarding the management of the above.   - Counseling given   - Consider ref  to comprehensive cancer support program     Patient-centered care/Shared decision-making:   We discussed the plan above at length. The patient and her husband actively contributed to the conversation. Specifically, her most important outcomes are:   Prolonging overall survival and Maintaining her overall functional activity     Supportive Care Needs: We recommend based on the patient???s underlying diagnosis and treatment history the following supportive care:    1. Antimicrobial prophylaxis:    Viral - valacyclovir and letermovir  Bacterial: Levofloxacin when ANC <0.5  Fungal: Fluconazole 200mg  when ANC <0.5  PJP: dapsone 100mg  daily    2. Blood product support:  Leukoreduced blood products are required.  Irradiated blood products are preferred, but in case of urgent transfusion needs non-irradiated blood products may be used: 2 units for Hg <=8.0.     Coordination of care:   - Continue maintenance q4 weeks    I personally spent 40 minutes face-to-face and non-face-to-face in the care of this patient, which includes all pre, intra, and post visit time on the date of service.  All documented time was specific to the E/M visit and does not include any procedures that may have been performed.      [name], AGPCNP-BC  Nurse Practitioner  Hematology/Oncology  [facility]      [name], MD was available  Leukemia Program  Division of Hematology  [facility]      Nurse Navigator (non-clinical trial patients): Elicia Lamp, RN        Tel. [phone]       Fax. [phone]  Toll-free appointments: [phone]  Scheduling assistance: [phone]  After hours/weekends: [phone] (ask for adult hematology/oncology on-call)      History of Present Illness:   Patricia [name] is a 48 y.o. female with past medical history noted as above who presents for follow up Ph+ ALL.      Her social history is notable for being a Runner, broadcasting/film/video.  She was recently married. She lives with her husband.    Interim history  Since her last visit,    She reports feeling great. However she is concerned because of daily elevated temps - mostly mid-99's, never 100.21F or higher.  She had another likely sinus infection, but that is improved (no sxs) after 4 days of Augmentin - she is completing 10 days.  She is getting stronger and able to do more projects at [employer] doing well - no pain right now.  Neuropathy lateral toes - stable  No sxs in fingers.  Noticed a blood clot between her teeth - has not seen a dentist recently            Oncology History is as below:   Hematology/Oncology History Overview Note   Referring/Local Oncologist:    Diagnosis:   10/29/2021  Bone marrow, left iliac, aspiration and biopsy  -  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 The majority of the abnormal cells (64/88) contained an additional BCR/ABL1 fusion signal, while 9/88 abnormal cells contained an additional ABL1 and ASS1 signal.       Genetics:   Karyotype/FISH:   RESULTS   Date Value Ref Range Status  10/28/2021   Final    NOTE: This report reflects a combined study from a peripheral blood and a bone marrow core biopsy. Eleven cells from the peripheral blood and nine cells from the bone marrow core biopsy were analyzed. The BCR/ABL1 FISH analysis was performed on the peripheral blood.     Abnormal Karyotype:  47,XX,t(9;22)(q34;q11.2),+der(22)t(9;22)[18]/46,XX[2]    Abnormal FISH:  A BCR/ABL1 interphase FISH assay showed a signal pattern consistent with a BCR::ABL1 rearrangement and the 9;22 translocation in 88% of the 100 cells scored. The majority of the abnormal cells (64/88) contained an additional BCR/ABL1 fusion signal, while 9/88 abnormal cells contained an additional ABL1 and ASS1 signal.           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  MRD by flow insufficient, p190 2/100,000  12/10/2021: Cycle 2 B cycle + rituximab  12/14/21: IT#4  12/17/21: IT#5  01/10/22: Cycle 3 A cycle + rituximab  01/11/22: IT #6  01/24/22: Bmbx - MRD-neg by PCR (p190)   02/17/22: C4 B cycle + ritux  02/18/22: IT#7  03/21/22: C5 A cycle + ritux (4th dose)  03/22/22: IT#8  04/29/22: Bmbx - MRD-neg by PCR (p190)   05/18/22: restart dasatinib 70mg  s/p neutropenia  05/26/22: Start maintenance vincristine 2mg , Prednisone 200mg  D1-5   06/02/22: IT #9  06/27/22: C2 Maintenance: vincristine 2mg , Prednisone 200mg  D1-5, HOLD dasatinib due to neutropenia  07/04/22: RESTART dasatinib 70mg   07/21/22: IT #10  07/26/22: C3 Maintenance: vincristine 2mg , Prednisone 200mg  D1-5, dasatinib 70 mg  08/08/22: IT #11 - prophylaxis completed  08/23/22: C4 Maintenance: vincristine 2mg , Prednisone 200mg  D1-5, dasatinib 70 mg  09/20/22: C5 Maintenance: vincristine 2mg , Prednisone 200mg  D1-5, dasatinib 70 mg  10/04/22: Bone marrow biopsy - continued remission, MRD and BCR/ABL p190 negative  10/18/22: C6 Maintenance. ANC 0.4, HOLD dasatinib, continue vincristine 2mg  and Prednisone 200mg  D1-5  11/15/22: C7 Maintenance ANC 0.4. HOLD TKI, continue vincristine 2mg  and Prednisone 200mg  D1-5  ~11/30/22: Start ponatinib 15 mg daily  12/13/22: C8 Maintenance ANC 1.2. ponatinib 15mg , vincristine 2mg  D1 and Prednisone 200mg  D1-5  01/10/23: C9 Maintenance Anc 0.7. HOLD ponatinib, continue vincristine 2mg  D1 and Prednisone 200mg  D1-5  01/26/23: ANC 1.7, restart ponatinib 15 mg  02/08/23: C10 Maintenance. ANC is 1.4 - continue ponatinib  03/07/23: C11 Maintenance ANC is 1.1. continue ponatinib  04/06/23: C12 Maintenance  04/12/23: Bmbx - continued remission, MRD-negative, BCR/ABL undetectable  05/02/23: C13 Maintenance  05/30/23: C14 Maintenance  06/27/23: C15 Maintenance PB p210 2/100,000  07/23/23: C16 Maintenance PB p210 2/100,000  08/22/23: C17 Maintenance PB p210 4/100,000  09/19/23: C18 Maintenance PB p210 PENDING         Review of Systems:   ROS reviewed and negative except as noted in H and P     Allergies:  Allergies   Allergen Reactions    Erythromycin Hives     Other reaction(s): Not available Other      Pt cant take ibuprofen due to condition.    Sulfa (Sulfonamide Antibiotics) Anaphylaxis     Other reaction(s): Not available    Azithromycin Hives     Per patient report       Medications:  Current Outpatient Medications:     acetaminophen (TYLENOL 8 HOUR) 650 MG CR tablet, Take 2 tablets (1,300 mg total) by mouth every eight (8) hours as needed for pain., Disp: , Rfl:     amoxicillin-clavulanate (AUGMENTIN) 875-125 mg per tablet, Take 1 tablet by mouth two (2) times a day for 14 days., Disp: 28 tablet, Rfl: 0    aspirin (ECOTRIN) 81 MG tablet, Take 1 tablet (81 mg total) by mouth daily., Disp: , Rfl:     cetirizine (ZYRTEC) 10 MG tablet, Take 1 tablet (10 mg total) by mouth nightly., Disp: 90 tablet, Rfl: 3    dapsone 100 MG tablet, Take 1 tablet (100 mg total) by mouth daily. TAKE 1 TABLET(100 MG) BY MOUTH DAILY, Disp: 30 tablet, Rfl: 11    escitalopram oxalate (LEXAPRO) 10 MG tablet, Take 1 tablet (10 mg total) by mouth daily., Disp: 90 tablet, Rfl: 2    letermovir (PREVYMIS) 480 mg tablet, Take 1 tablet (480 mg total) by mouth daily., Disp: 28 tablet, Rfl: 5    losartan (COZAAR) 50 MG tablet, Take 1 tablet (50 mg total) by mouth daily., Disp: 30 tablet, Rfl: 3    PONATinib (ICLUSIG) 15 mg tablet, Take 1 tablet (15 mg total) by mouth daily. Swallow tablets whole. Do not crush, break, cut or chew tablets., Disp: 30 tablet, Rfl: 5    predniSONE (DELTASONE) 50 MG tablet, Take 4 tablets (200 mg total) by mouth once daily on days 1-5 of each cycle., Disp: 20 tablet, Rfl: 5    rosuvastatin (CRESTOR) 10 MG tablet, Take 1 tablet (10 mg total) by mouth nightly., Disp: 90 tablet, Rfl: 3    valACYclovir (VALTREX) 500 MG tablet, Take 1 tablet (500 mg total) by mouth daily., Disp: 30 tablet, Rfl: 12    albuterol HFA 90 mcg/actuation inhaler, Inhale 2 puffs every six (6) hours as needed for wheezing., Disp: 8 g, Rfl: 1    levalbuterol (XOPENEX) 0.31 mg/3 mL nebulizer solution, Inhale 3 mL (0.31 mg total) by nebulization every four (4) hours as needed for wheezing (cough)., Disp: 45 mL, Rfl: 0    Medical History:  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    EBV infection        Social History:  Social History     Social History Narrative    Not on file       Family History:  No family history on file.    Objective:   BP 127/91  - Pulse 103  - Temp 37.2 ??C (99 ??F) (Oral)  - Resp 16  - Ht 170 cm (5' 6.93)  - Wt 91.9 kg (202 lb 9.6 oz)  - SpO2 96%  - BMI 31.80 kg/m??     Physical Exam:  GENERAL: well-appearing  woman, unaccompanied  HEART: Normal color, not excessive pallor.  CHEST/LUNG: Normal work of breathing. No dyspnea with conversation.   ABDOMEN: No obvious distention.    EXTREMITIES: No edema, cyanosis or clubbing.   SKIN: no rashes noted  NEURO EXAM: Grossly intact.       Test Results:  09/19/2023 BCR/ABL, CBC/d and CMP reviewed with patient

## 2023-09-19 ENCOUNTER — Ambulatory Visit: Admit: 2023-09-19 | Discharge: 2023-09-19 | Payer: PRIVATE HEALTH INSURANCE

## 2023-09-19 ENCOUNTER — Inpatient Hospital Stay: Admit: 2023-09-19 | Discharge: 2023-09-19 | Payer: PRIVATE HEALTH INSURANCE

## 2023-09-19 ENCOUNTER — Other Ambulatory Visit: Admit: 2023-09-19 | Discharge: 2023-09-19 | Payer: PRIVATE HEALTH INSURANCE

## 2023-09-19 ENCOUNTER — Ambulatory Visit
Admit: 2023-09-19 | Discharge: 2023-09-19 | Payer: PRIVATE HEALTH INSURANCE | Attending: Adult Health | Primary: Adult Health

## 2023-09-19 DIAGNOSIS — C9100 Acute lymphoblastic leukemia not having achieved remission: Principal | ICD-10-CM

## 2023-09-19 LAB — CBC W/ AUTO DIFF
BASOPHILS ABSOLUTE COUNT: 0 10*9/L (ref 0.0–0.1)
BASOPHILS RELATIVE PERCENT: 0.9 %
EOSINOPHILS ABSOLUTE COUNT: 0.1 10*9/L (ref 0.0–0.5)
EOSINOPHILS RELATIVE PERCENT: 2.3 %
HEMATOCRIT: 34.3 % (ref 34.0–44.0)
HEMOGLOBIN: 11.8 g/dL (ref 11.3–14.9)
LYMPHOCYTES ABSOLUTE COUNT: 1.3 10*9/L (ref 1.1–3.6)
LYMPHOCYTES RELATIVE PERCENT: 30.9 %
MEAN CORPUSCULAR HEMOGLOBIN CONC: 34.2 g/dL (ref 32.0–36.0)
MEAN CORPUSCULAR HEMOGLOBIN: 32.1 pg (ref 25.9–32.4)
MEAN CORPUSCULAR VOLUME: 93.8 fL (ref 77.6–95.7)
MEAN PLATELET VOLUME: 9.3 fL (ref 6.8–10.7)
MONOCYTES ABSOLUTE COUNT: 0.4 10*9/L (ref 0.3–0.8)
MONOCYTES RELATIVE PERCENT: 9.3 %
NEUTROPHILS ABSOLUTE COUNT: 2.4 10*9/L (ref 1.8–7.8)
NEUTROPHILS RELATIVE PERCENT: 56.6 %
PLATELET COUNT: 162 10*9/L (ref 150–450)
RED BLOOD CELL COUNT: 3.66 10*12/L — ABNORMAL LOW (ref 3.95–5.13)
RED CELL DISTRIBUTION WIDTH: 18.3 % — ABNORMAL HIGH (ref 12.2–15.2)
WBC ADJUSTED: 4.2 10*9/L (ref 3.6–11.2)

## 2023-09-19 LAB — COMPREHENSIVE METABOLIC PANEL
ALBUMIN: 4.2 g/dL (ref 3.4–5.0)
ALKALINE PHOSPHATASE: 76 U/L (ref 46–116)
ALT (SGPT): 76 U/L — ABNORMAL HIGH (ref 10–49)
ANION GAP: 14 mmol/L (ref 5–14)
AST (SGOT): 48 U/L — ABNORMAL HIGH (ref <=34)
BILIRUBIN TOTAL: 0.4 mg/dL (ref 0.3–1.2)
BLOOD UREA NITROGEN: 15 mg/dL (ref 9–23)
BUN / CREAT RATIO: 26
CALCIUM: 9.4 mg/dL (ref 8.7–10.4)
CHLORIDE: 100 mmol/L (ref 98–107)
CO2: 24 mmol/L (ref 20.0–31.0)
CREATININE: 0.58 mg/dL (ref 0.55–1.02)
EGFR CKD-EPI (2021) FEMALE: >90 mL/min/{1.73_m2} (ref >=60)
GLUCOSE RANDOM: 329 mg/dL — ABNORMAL HIGH (ref 70–179)
POTASSIUM: 4.4 mmol/L (ref 3.4–4.8)
PROTEIN TOTAL: 7.3 g/dL (ref 5.7–8.2)
SODIUM: 138 mmol/L (ref 135–145)

## 2023-09-19 MED ORDER — ALBUTEROL SULFATE HFA 90 MCG/ACTUATION AEROSOL INHALER
Freq: Four times a day (QID) | RESPIRATORY_TRACT | 1 refills | 0.00 days | Status: CP | PRN
Start: 2023-09-19 — End: ?

## 2023-09-19 MED ADMIN — heparin, porcine (PF) 100 unit/mL injection 500 Units: 500 [IU] | INTRAVENOUS | @ 17:00:00 | Stop: 2023-09-20

## 2023-09-19 MED ADMIN — sodium chloride (NS) 0.9 % infusion: 100 mL/h | INTRAVENOUS | @ 17:00:00

## 2023-09-19 MED ADMIN — vinCRIStine (ONCOVIN) 2 mg in sodium chloride (NS) 0.9 % 25 mL IVPB: 2 mg | INTRAVENOUS | @ 17:00:00 | Stop: 2023-09-19

## 2023-09-19 MED FILL — ROSUVASTATIN 10 MG TABLET: ORAL | 90 days supply | Qty: 90 | Fill #3

## 2023-09-19 MED FILL — PREVYMIS 480 MG TABLET: ORAL | 28 days supply | Qty: 28 | Fill #5

## 2023-09-19 MED FILL — PREDNISONE 50 MG TABLET: 5 days supply | Qty: 20 | Fill #1

## 2023-09-19 NOTE — Unmapped (Signed)
 Proceed with the next cycle.  Continue ponatinib and aspirin daily.  IV vincristine today.  Prednisone daily x 5 days.  Please ask the nurse to collect the CMV level.    IVIG as scheduled.    Next month you will have a bone marrow biopsy.  Plan a virtual visit with Dr. Senaida Ores a week after the bone marrow biopsy

## 2023-09-19 NOTE — Unmapped (Signed)
 Patient present for vincristine. Port flushed with brisk blood return. Chemotherapy administered per treatment plan without adverse event. AVS declined. Port heparin locked and de-accessed.

## 2023-09-20 DIAGNOSIS — C9100 Acute lymphoblastic leukemia not having achieved remission: Principal | ICD-10-CM

## 2023-09-20 LAB — CMV DNA, QUANTITATIVE, PCR: CMV VIRAL LD: NOT DETECTED

## 2023-09-25 IMAGING — US US ABDOMEN LIMITED
1 series · 15 of 25 positions shown · non-contrast
Comparison: CT 01/21/2016

CLINICAL DATA: Upper abdominal pain

EXAM:
ULTRASOUND ABDOMEN LIMITED RIGHT UPPER QUADRANT

[Series 1: us abdomen limited ruq · 15 of 46 slices shown]
[im 1/46]
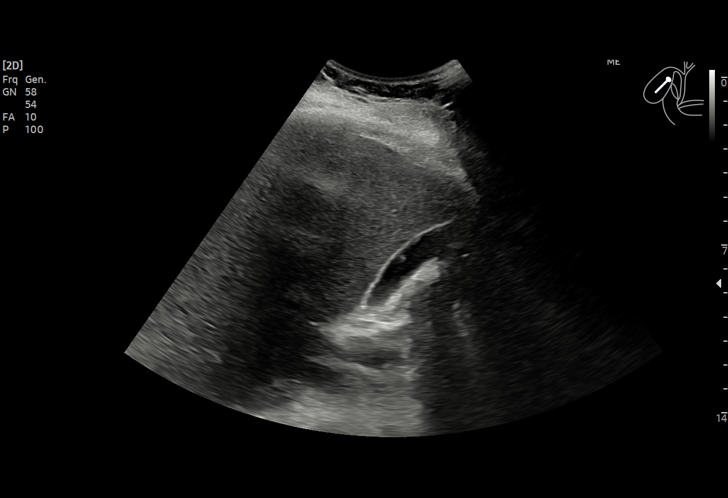
[im 4/46]
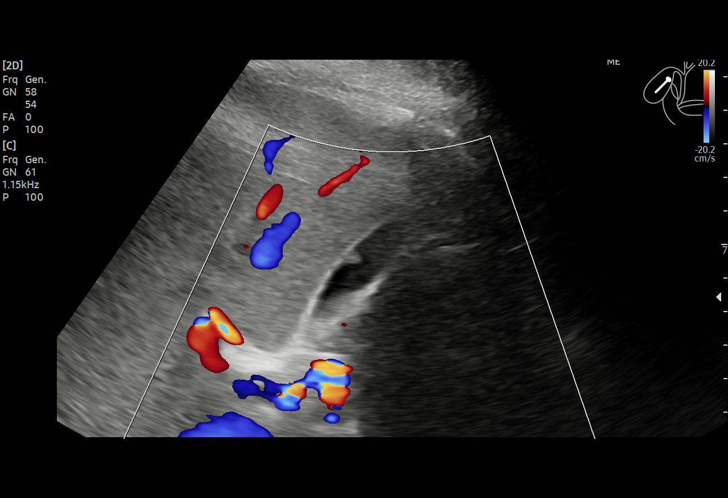
[im 8/46]
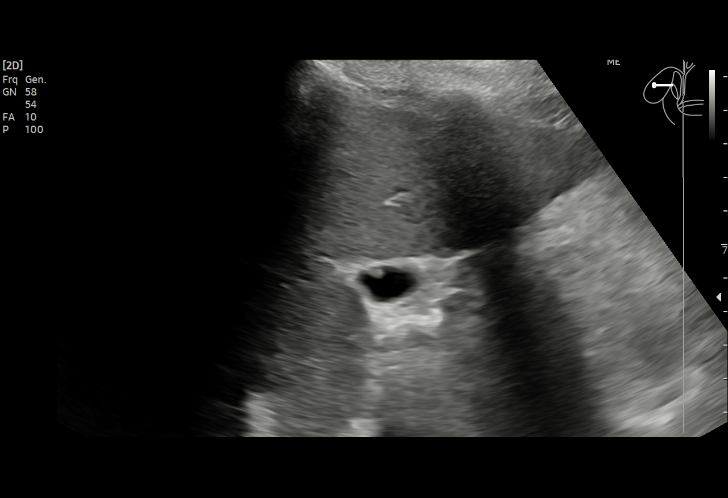
[im 10/46]
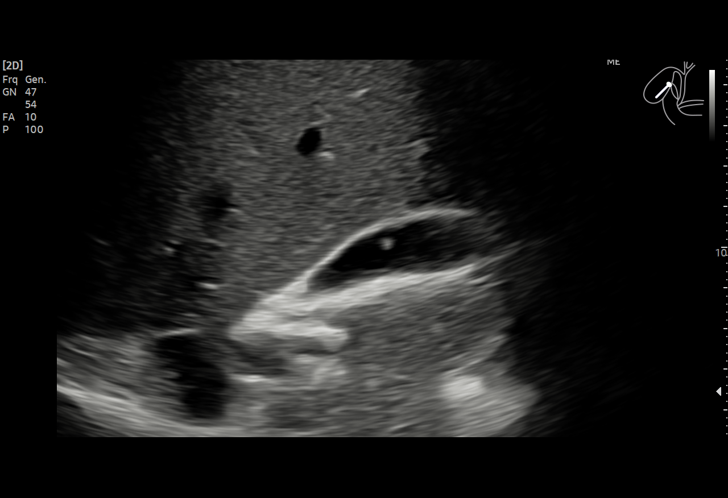
[im 14/46]
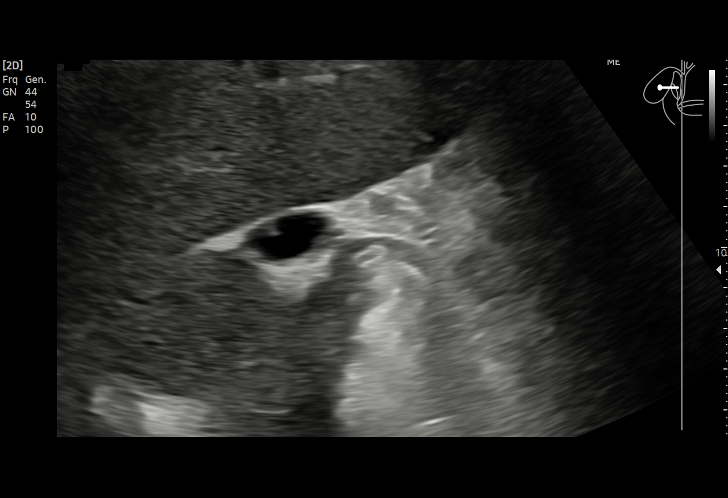
[im 17/46]
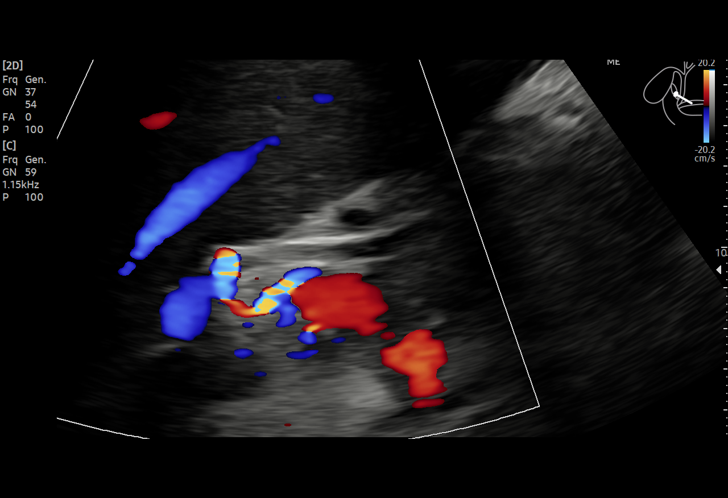
[im 19/46]
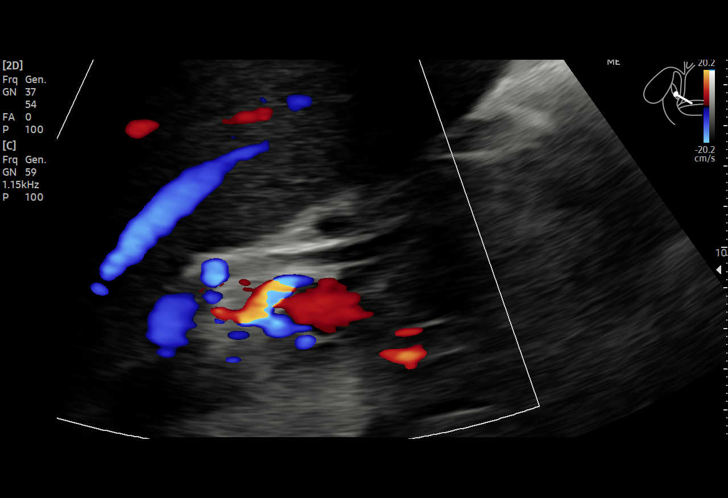
[im 23/46]
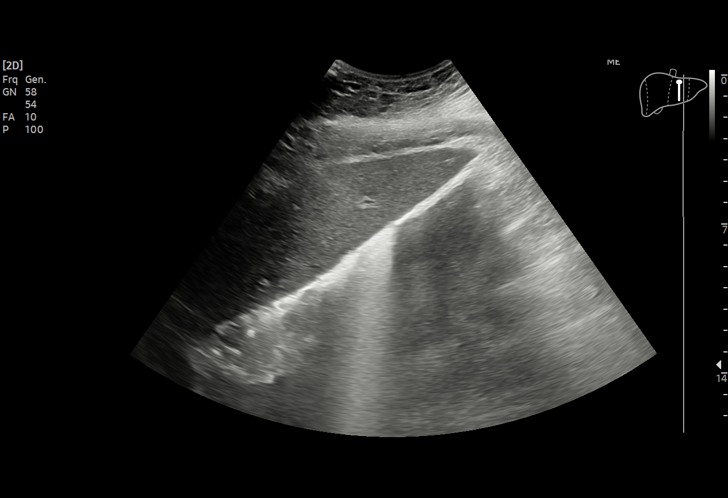
[im 27/46]
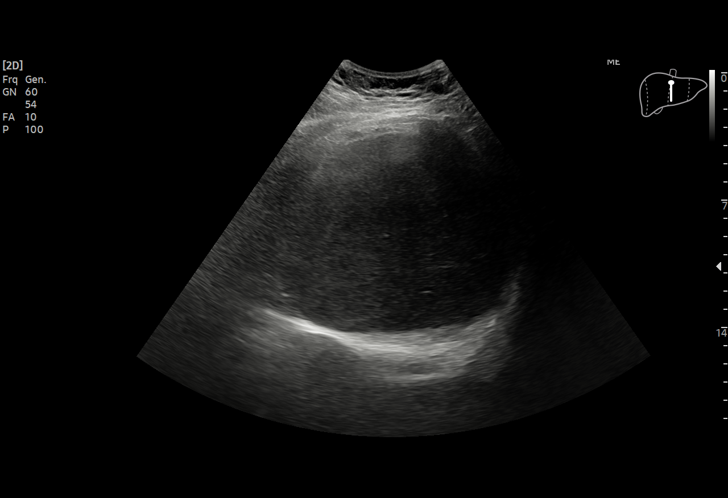
[im 29/46]
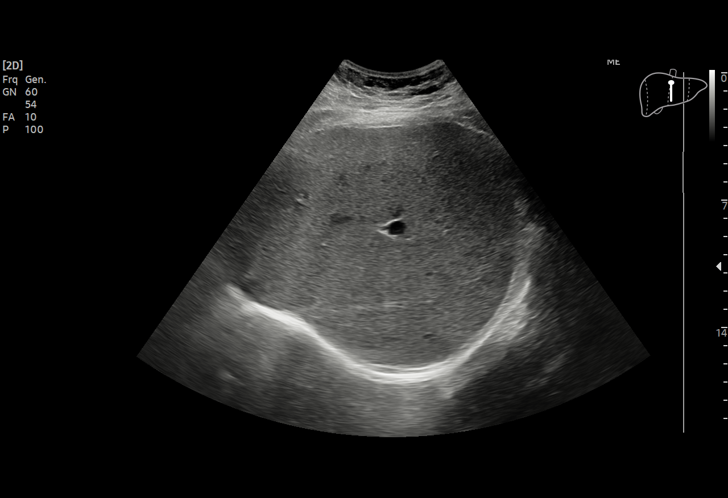
[im 32/46]
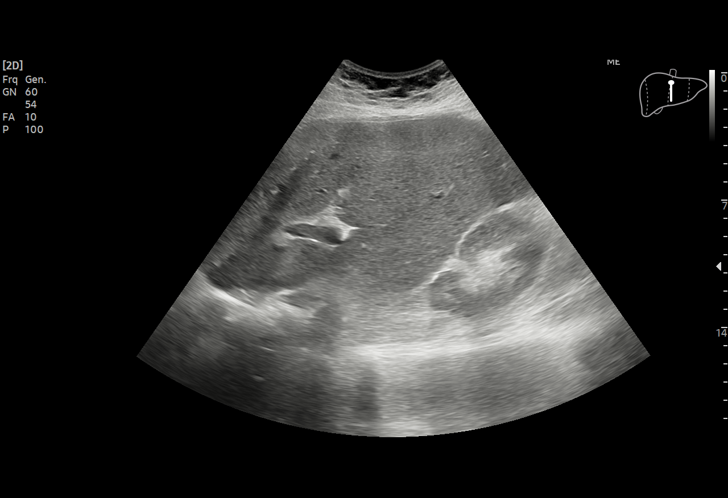
[im 36/46]
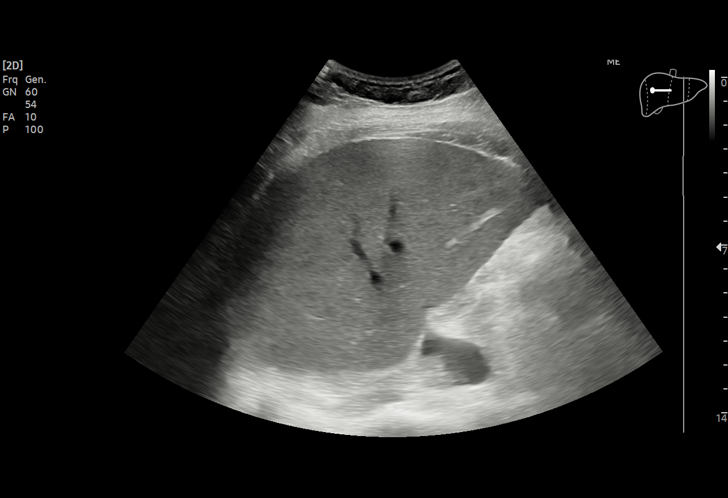
[im 38/46]
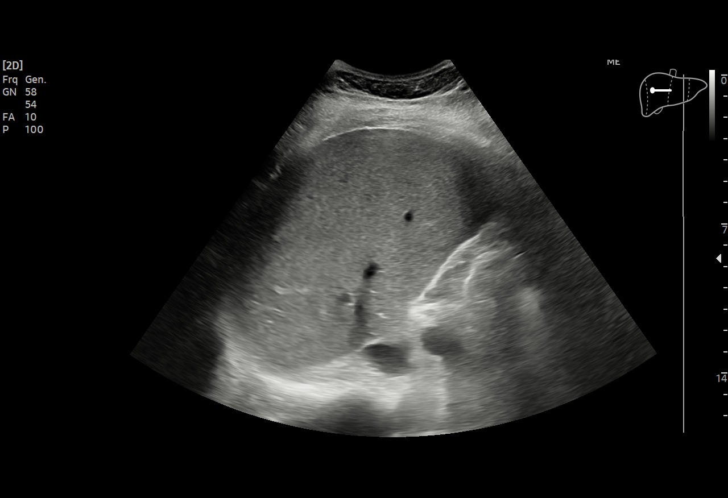
[im 42/46]
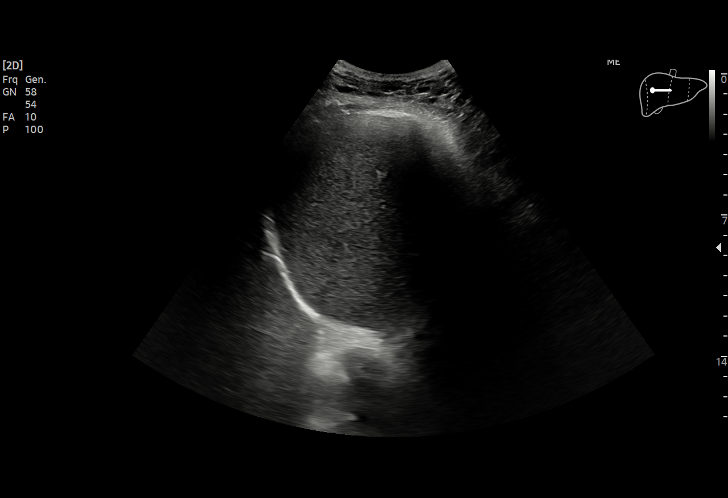
[im 46/46]
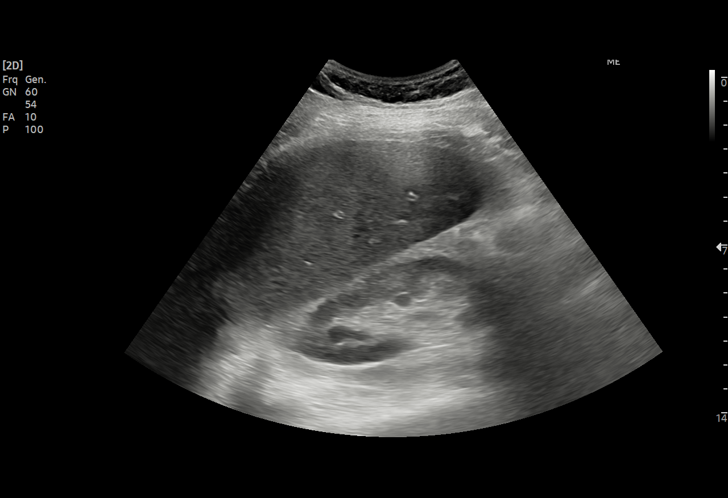

[15 of 25 positions shown; findings below may reference images not displayed]

FINDINGS: Gallbladder:

3.7 mm polyp along the anterior gallbladder wall. No follow-up
recommended given this appearance. No gallbladder wall thickening.
No stone. No Murphy sign.

Common bile duct:

Diameter: 4 mm common normal.

Liver:

Slightly increased echogenicity of the liver suggesting mild
steatosis. No focal lesion or ductal dilatation. Portal vein is
patent on color Doppler imaging with normal direction of blood flow
towards the liver.

Other: None.
IMPRESSION: No evidence of gallstone or cholecystitis.

3.7 mm gallbladder polyp. No follow-up recommended for this low
suspicion abnormality.

Mild diffuse fatty change of the liver.

## 2023-09-25 IMAGING — DX DG CHEST 1V PORT
1 series · 1 of 1 positions shown · non-contrast
Comparison: Prior chest radiograph 09/17/2020.

CLINICAL DATA: Provided history: Patient reports right shoulder
pain. No known trauma. Possible "blood infection."

EXAM:
PORTABLE CHEST 1 VIEW

[chest ap]
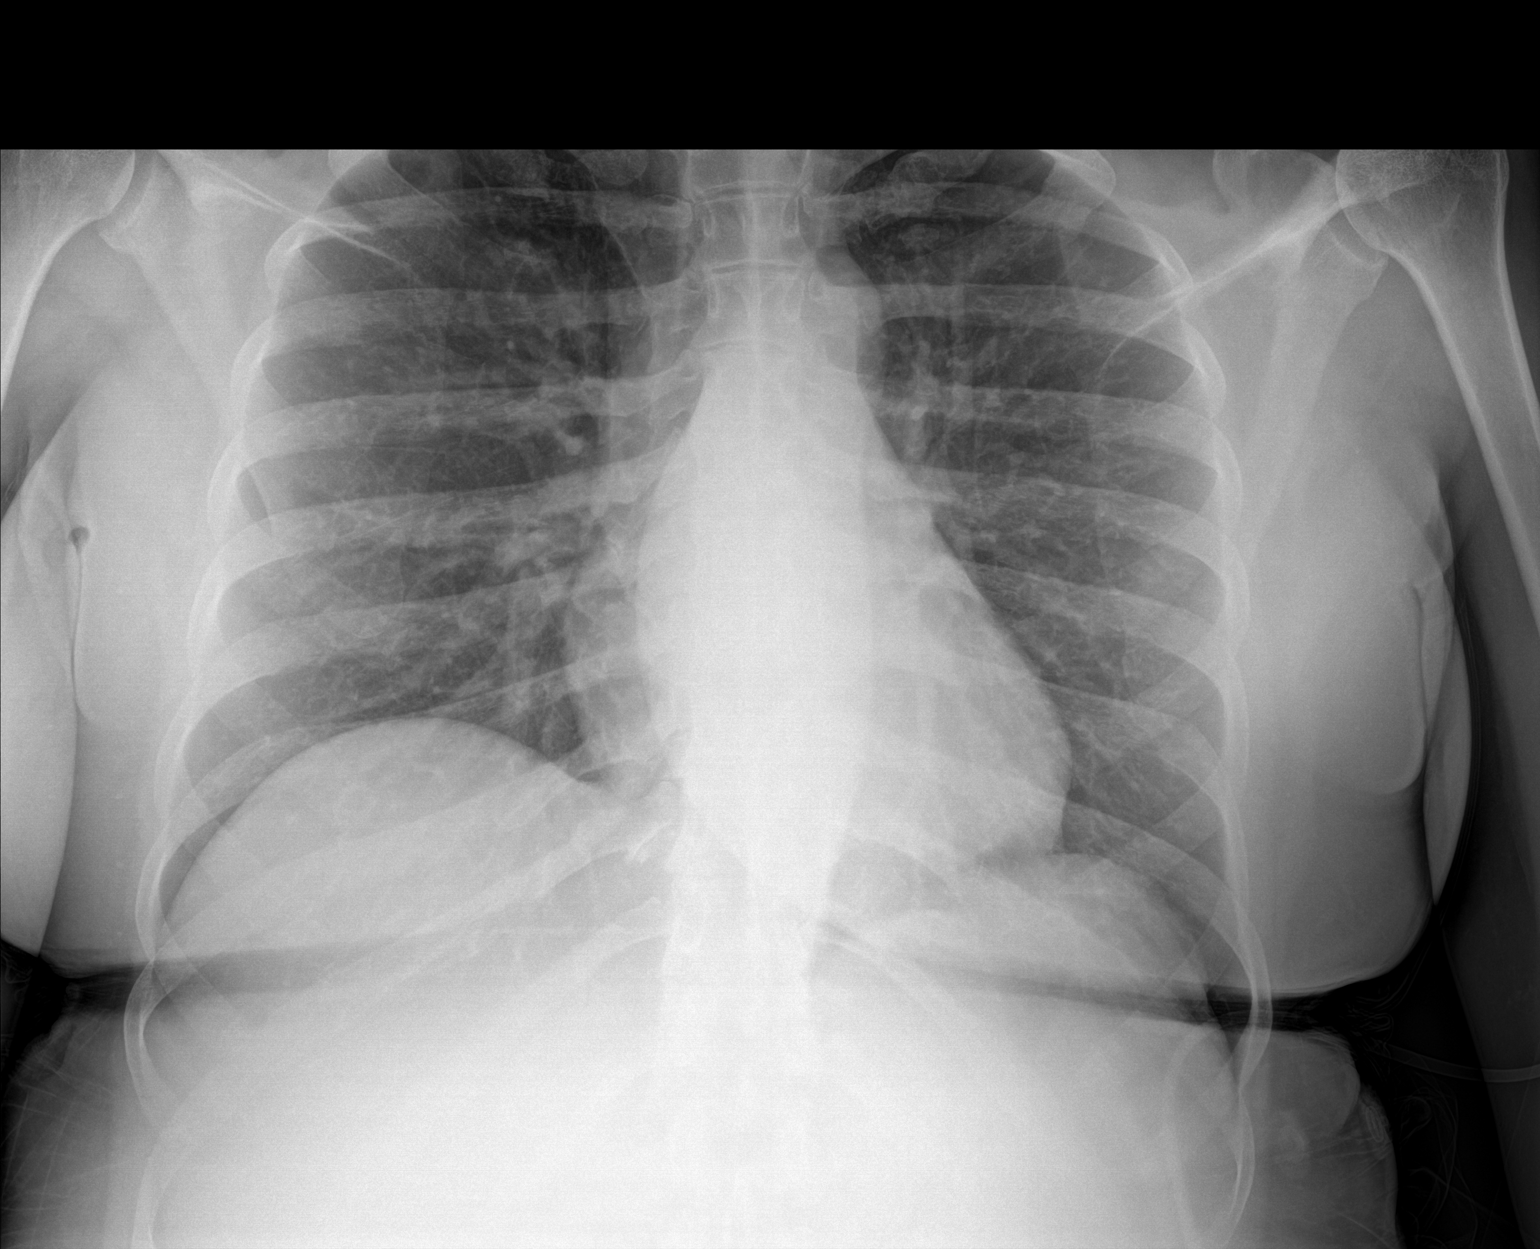

[1 of 1 positions shown; findings below may reference images not displayed]

FINDINGS: Heart size within normal limits. No appreciable airspace
consolidation. No evidence of pleural effusion or pneumothorax. No
acute bony abnormality identified.
IMPRESSION: No evidence of acute cardiopulmonary abnormality.

## 2023-09-25 IMAGING — DX DG SHOULDER 2+V*R*
4 series · 4 of 4 positions shown · non-contrast
Comparison: None

CLINICAL DATA: Provided history: Right shoulder pain. No history of
trauma. Shortness of breath. Weakness.

EXAM:
RIGHT SHOULDER - 2+ VIEW

[shoulder axial]
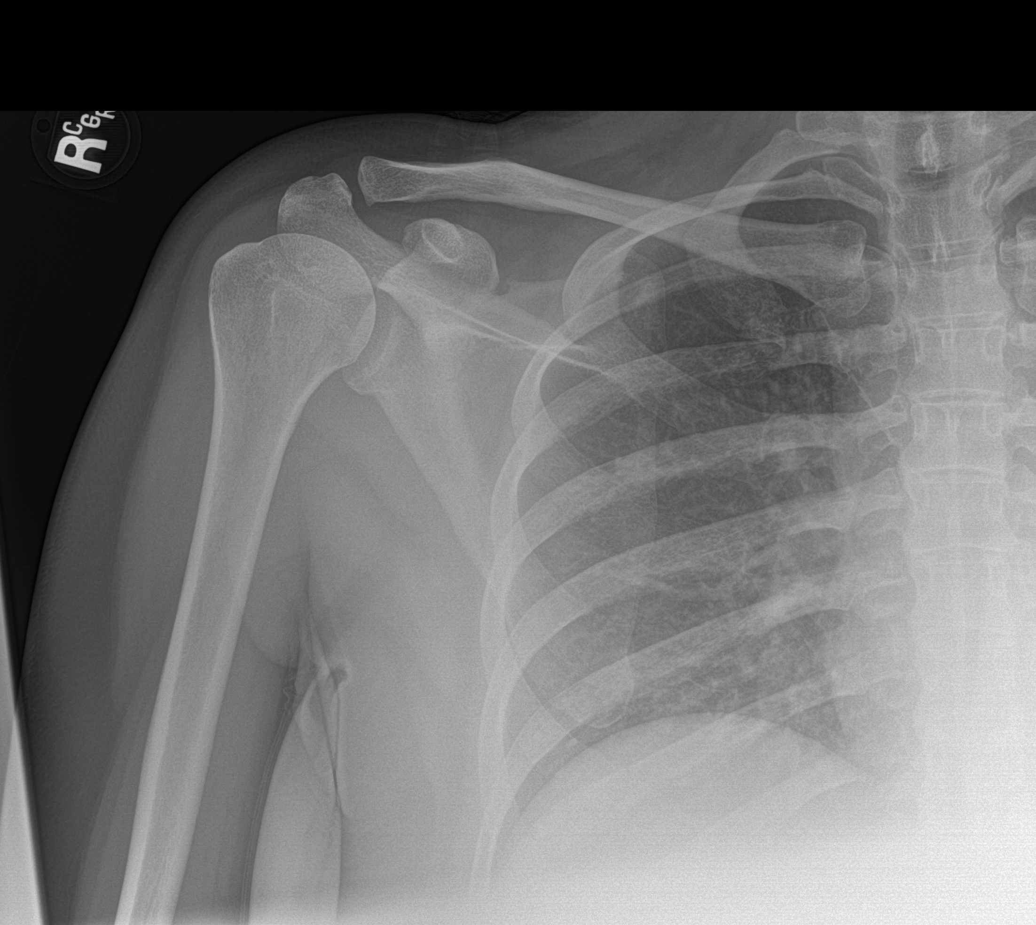

[shoulder ap]
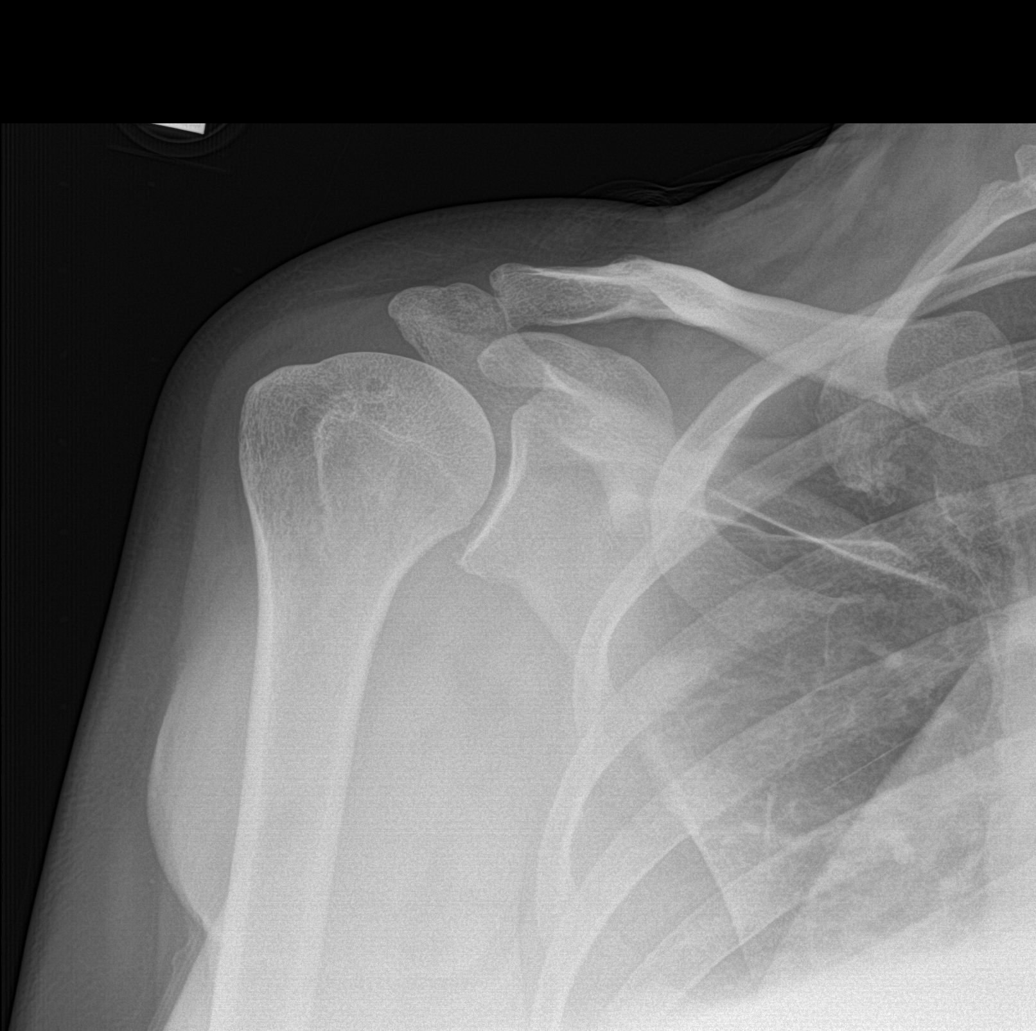

[shoulder obl (1 of 2)]
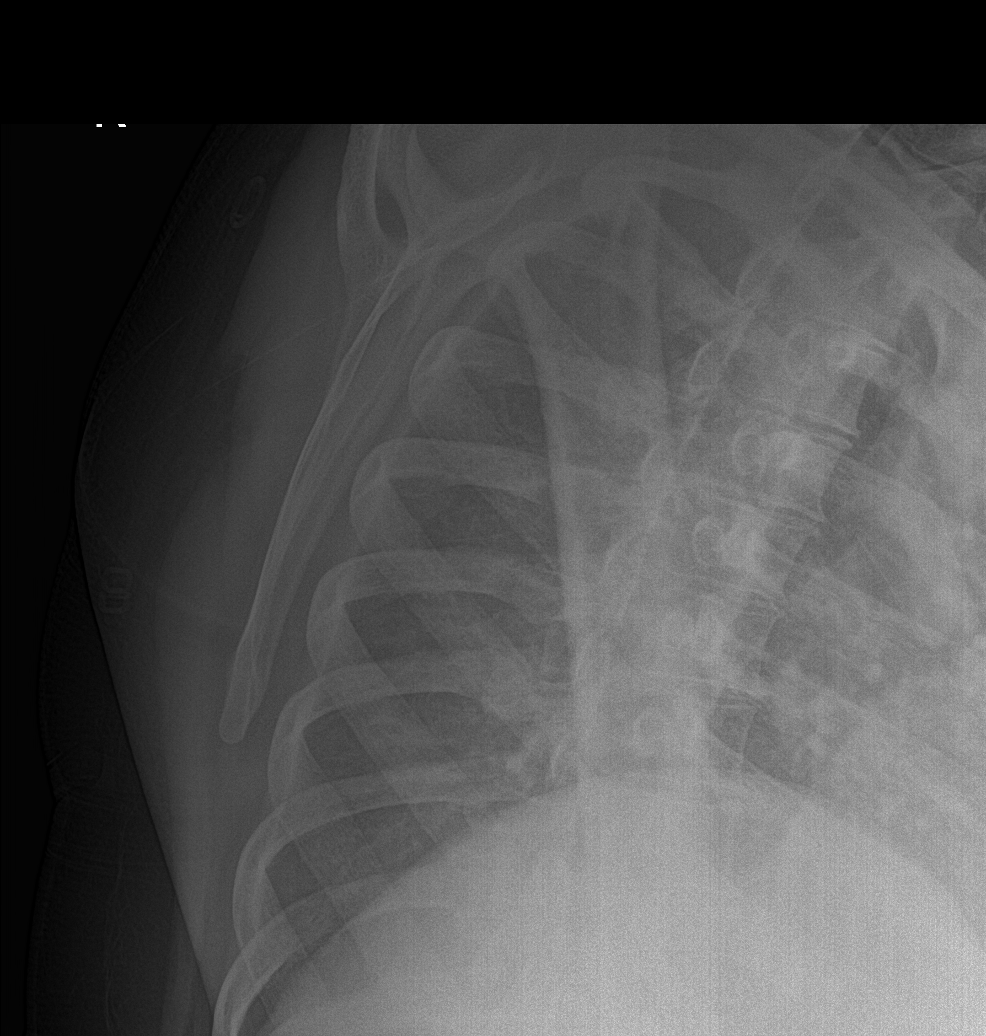

[shoulder obl (2 of 2)]
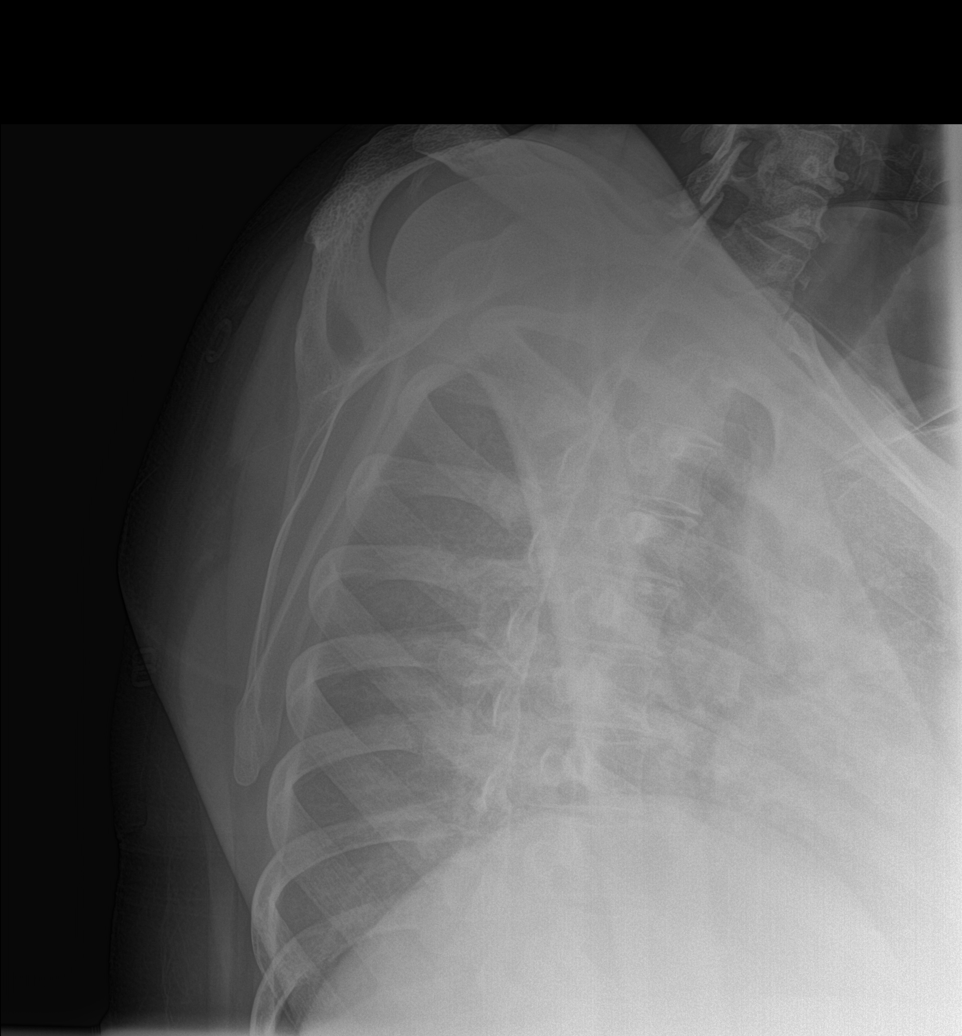

[4 of 4 positions shown; findings below may reference images not displayed]

FINDINGS: There is normal bony alignment.

No evidence of acute osseous or articular abnormality.

The joint spaces are maintained.
IMPRESSION: No evidence of acute osseous or articular abnormality.

## 2023-09-26 DIAGNOSIS — C9100 Acute lymphoblastic leukemia not having achieved remission: Principal | ICD-10-CM

## 2023-09-27 ENCOUNTER — Encounter: Admit: 2023-09-27 | Discharge: 2023-09-28 | Payer: PRIVATE HEALTH INSURANCE

## 2023-09-27 MED ADMIN — acetaminophen (TYLENOL) tablet 650 mg: 650 mg | ORAL | @ 15:00:00 | Stop: 2023-09-27

## 2023-09-27 MED ADMIN — immun glob G(IgG)-pro-IgA 0-50 (PRIVIGEN) 10 % intravenous solution 30 g: .4 g/kg | INTRAVENOUS | @ 16:00:00 | Stop: 2023-09-27

## 2023-09-27 MED ADMIN — diphenhydrAMINE (BENADRYL) capsule/tablet 25 mg: 25 mg | ORAL | @ 15:00:00 | Stop: 2023-09-27

## 2023-09-27 MED FILL — CETIRIZINE 10 MG TABLET: ORAL | 90 days supply | Qty: 90 | Fill #0

## 2023-09-27 NOTE — Unmapped (Signed)
 Pt presents for IVIG.  VSS, pt weight is 92 kg. Right chest lateral port accessed as per protocol with  first no blood return after flushing 20cc saline  flushes then  prn order released for cath flo,after position change some of her position changed some blood return she wanted go for her infusion first,will assess her again for need for cath flo.now on hold for cath flo.. Patient made aware for potential reaction/side effects, call bell within reach.        1045  IVIG 30 g started, to infuse at the following rates:     27 ml/hr for 15 min  92 ml/hr for 30 min  184 ml/hr for 30 min  441 ml/hr until complete.  12 25  IVIG complete.  Pt tolerated without complication, VSS.   now got brisk blood return from her port. chest port lateral flushed and heparinized as per protocol . Patient tolerated well without complicationsIV flushed per policy .  Pt left clinic in no acute distress

## 2023-10-09 DIAGNOSIS — C9100 Acute lymphoblastic leukemia not having achieved remission: Principal | ICD-10-CM

## 2023-10-09 DIAGNOSIS — D801 Nonfamilial hypogammaglobulinemia: Principal | ICD-10-CM

## 2023-10-09 NOTE — Unmapped (Signed)
 [facility] Specialty and Home Delivery Pharmacy Refill Coordination Note    Patricia [name], DOB: Dec 10, 1975  Phone: [phone] (home)       All above HIPAA information was verified with patient.         10/08/2023     9:23 AM   Specialty Rx Medication Refill Questionnaire   Which Medications would you like refilled and shipped? Iclusig   Please list all current allergies: Same/unchanged   Have you missed any doses in the last 30 days? No   Have you had any changes to your medication(s) since your last refill? No   How many days remaining of each medication do you have at home? 7   Have you experienced any side effects in the last 30 days? No   Please enter the full address (street address, city, state, zip code) where you would like your medication(s) to be delivered to. [address], [place] [address]   Please specify on which day you would like your medication(s) to arrive. Note: if you need your medication(s) within 3 days, please call the pharmacy to schedule your order at [phone]  10/12/2023   Has your insurance changed since your last refill? No   Would you like a pharmacist to call you to discuss your medication(s)? No   Do you require a signature for your package? (Note: if we are billing Medicare Part B or your order contains a controlled substance, we will require a signature) No   Additional Comments: Theres never been a Agricultural consultant. Is this something new?         Completed refill call assessment today to schedule patient's medication shipment from the [facility] [phone]).  All relevant notes have been reviewed.       Confirmed patient received a Conservation officer, historic buildings and a Surveyor, mining with first shipment. The patient will receive a drug information handout for each medication shipped and additional FDA Medication Guides as required.         REFERRAL TO PHARMACIST     Referral to the pharmacist: Not needed      [facility]     Shipping address confirmed in Epic. Delivery Scheduled: Yes, Expected medication delivery date: 10/12/23.     Medication will be delivered via Next Day Courier to the prescription address in Epic WAM.    Tobi Bastos, PharmD   [facility] Specialty Pharmacist

## 2023-10-11 MED FILL — ICLUSIG 15 MG TABLET: ORAL | 30 days supply | Qty: 30 | Fill #4

## 2023-10-16 NOTE — Unmapped (Signed)
 [facility] Follow Up Visit Note     [name]  Patient Age: 48 y.o.  Encounter Date: 10/17/2023    Primary Care Provider:  [name], MD    Referring Physician:  Lenon Ahmadi, AGNP  [address]  [id number]  [place],  [place] [address]    Cancer Diagnosis: Ph+ B-ALL; Initial Dx 10/29/2021  Cancer status: CR1, BCR/ABL p190 negative in marrow  Treatment Regimen: First Line, ponatinib + Maintenance (vincristine, prednisone) s/p truncated course of GRAAPH-2005, HyperCVAD  Treatment Goal: Curative  Comorbidities: HTN, anxiety, hemorrhoids with anal fissures  Transplant: referral deferred in CR1    Assessment/Plan:  Patricia [name] is a 48 y.o. female with past medical history of chronic right shoulder and back pain, hypertension, anxiety, and recently diagnosed Ph+ ALL. 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  Given her deep molecular remission and her intolerance of hyperCVAD, we are transitioned to maintenance chemotherapy.       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 BCR-ABL level decreased to 2. Bone marrow biopsy scheduled for today to assess current status. Discussed potential treatment adjustments if BCR-ABL levels increase.  - Perform bone marrow biopsy today.  - Continue ponatinib 15 mg oral daily.  - Administer vincristine 2 mg IV as scheduled.  - Hold this cycle's prednisone until further assessment of blood glucose levels.  - Monitor BCR-ABL levels and adjust treatment based on bone marrow biopsy results.    Hyperglycemia  Blood glucose elevated at 479 mg/dL, repeat 761 mg/dL, hemoglobin Y0V 3.7%. Prior A1c was normal. No prior known diabetes. High glucose levels may be related to intermittent steroid dosing, dietary intake or other factors. Initiated metformin and discussed potential short-term insulin use if glucose remains high.  - Start metformin 500 mg oral daily with food for 2 weeks, then increase to 500 mg twice daily for 2 weeks.  - Plan to titrate metformin to 1 g BID as tolerated.  - Prescribe glucose monitoring kit.  - Instruct her to check glucose levels in the morning and before dinner and keep a journal.  - Recommend she contact primary care provider for further diabetes management.    Peripheral neuropathy due to chemotherapy  Reports numbness and tingling in the right thumb and toes, consistent with peripheral neuropathy.  - Monitor symptoms of peripheral neuropathy.    ____________________________________        Ph+ALL, in remission: On GRAAPH 2005 + dasatinib. Completed 12/12 planned IT treatments for CNS ppx.  - Proceed with next cycle of Maintenance -  VCR, pred 100 D1-5   - ponatinib 15 mg dialy  - q 3 month BCR/ABL - recheck this month  - q 6 month bmbx - today (10/16/23) follow up results  - plan blina if ALL progresses  - scheduled orders signed through C21    Pancytopenia due to chemotherapy  - ANC and Platelets sufficient to proceed without changes 10/17/2023   - supportive care as detailed below when in nadir    HTN: ponatinib likely contributes  - Losartan - start 02/08/23  - monitor BP trend at home    Hx CMV viremia: Dr. 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 09/2022 restarted Valcyte with the goal of controlling CMV and limiting neutropenia  - continue letermovir  -  d/c CMV levels as of 03/2023 - per Dr. Kari Baars  - 09/19/23 - CMV negative - checked due to elevated temps    Hypogammaglobulinemia:   - Monthly IVIG started 07/26/22 - now at [facility] due to insurance coverage    Left knee pain  - 08/22/23 - will refer to orthopedics is pain persists/worsens    Hemorrhoids, Anal fissure: persistent but stable, per patient  - supportive care at home    Menses suppression and birth control: previously received Lupron with last dose 07/26/22   - patient stopped oral contraceptive due to weight loss. Counseled on importance of birth control on TKI. She is abstinent and making an appointment with GYN       Psychosocial distress: She reports a moderate level of anxiety regarding the management of the above.   - Counseling given   - Consider ref to comprehensive cancer support program     Patient-centered care/Shared decision-making:   We discussed the plan above at length. The patient and her husband actively contributed to the conversation. Specifically, her most important outcomes are:   Prolonging overall survival and Maintaining her overall functional activity     Supportive Care Needs: We recommend based on the patient???s underlying diagnosis and treatment history the following supportive care:    1. Antimicrobial prophylaxis:    Viral - valacyclovir and letermovir  Bacterial: Levofloxacin when ANC <0.5  Fungal: Fluconazole 200mg  when ANC <0.5  PJP: dapsone 100mg  daily    2. Blood product support:  Leukoreduced blood products are required.  Irradiated blood products are preferred, but in case of urgent transfusion needs non-irradiated blood products may be used: 2 units for Hg <=8.0.     Coordination of care:   - Continue maintenance q4 weeks    I personally spent 40 minutes face-to-face and non-face-to-face in the care of this patient, which includes all pre, intra, and post visit time on the date of service.  All documented time was specific to the E/M visit and does not include any procedures that may have been performed.      [name], AGPCNP-BC  Nurse Practitioner  Hematology/Oncology  [facility]      [name], MD was available  Leukemia Program  Division of Hematology  [facility]      Nurse Navigator (non-clinical trial patients): Elicia Lamp, RN        Tel. [phone]       Fax. [phone]  Toll-free appointments: [phone]  Scheduling assistance: [phone]  After hours/weekends: [phone] (ask for adult hematology/oncology on-call)      History of Present Illness:   Patricia [name] is a 48 y.o. female with past medical history noted as above who presents for follow up Ph+ ALL.      Her social history is notable for being a Runner, broadcasting/film/video.  She was recently married. She lives with her husband.    Interim history    History of Present Illness  The patient, who is being treated for [place] positive B cell ALL, is currently on cycle nineteen of maintenance therapy with penadnet plus vincristine plus prednisone. She is scheduled for a bone marrow biopsy. The patient's blood glucose level is unusually high, which is a new development. She reports feeling unwell, with a constant headache and neck pain, which she attributes to arthritis and low air pressure. The patient also reports a recent sinus infection, which she treated with Augmentin. The patient's numbness and tingling in the fingers and toes have  increased, and she reports some constipation. The patient needs refills for prednisone, valacyclovir and Prevymis. The patient's BCR/ABL level has fluctuated, and the doctor discusses potential changes to the treatment plan depending on the results of the bone marrow biopsy.        Oncology History is as below:   Hematology/Oncology History Overview Note   Referring/Local Oncologist:    Diagnosis:   10/29/2021  Bone marrow, left iliac, aspiration and biopsy  -  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 The majority of the abnormal cells (64/88) contained an additional BCR/ABL1 fusion signal, while 9/88 abnormal cells contained an additional ABL1 and ASS1 signal.       Genetics:   Karyotype/FISH:   RESULTS   Date Value Ref Range Status   10/28/2021   Final    NOTE: This report reflects a combined study from a peripheral blood and a bone marrow core biopsy. Eleven cells from the peripheral blood and nine cells from the bone marrow core biopsy were analyzed. The BCR/ABL1 FISH analysis was performed on the peripheral blood.     Abnormal Karyotype:  47,XX,t(9;22)(q34;q11.2),+der(22)t(9;22)[18]/46,XX[2]    Abnormal FISH:  A BCR/ABL1 interphase FISH assay showed a signal pattern consistent with a BCR::ABL1 rearrangement and the 9;22 translocation in 88% of the 100 cells scored. The majority of the abnormal cells (64/88) contained an additional BCR/ABL1 fusion signal, while 9/88 abnormal cells contained an additional ABL1 and ASS1 signal.           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  MRD by flow insufficient, p190 2/100,000  12/10/2021: Cycle 2 B cycle + rituximab  12/14/21: IT#4  12/17/21: IT#5  01/10/22: Cycle 3 A cycle + rituximab  01/11/22: IT #6  01/24/22: Bmbx - MRD-neg by PCR (p190)   02/17/22: C4 B cycle + ritux  02/18/22: IT#7  03/21/22: C5 A cycle + ritux (4th dose)  03/22/22: IT#8  04/29/22: Bmbx - MRD-neg by PCR (p190)   05/18/22: restart dasatinib 70mg  s/p neutropenia  05/26/22: Start maintenance vincristine 2mg , Prednisone 200mg  D1-5   06/02/22: IT #9  06/27/22: C2 Maintenance: vincristine 2mg , Prednisone 200mg  D1-5, HOLD dasatinib due to neutropenia  07/04/22: RESTART dasatinib 70mg   07/21/22: IT #10  07/26/22: C3 Maintenance: vincristine 2mg , Prednisone 200mg  D1-5, dasatinib 70 mg  08/08/22: IT #11 - prophylaxis completed  08/23/22: C4 Maintenance: vincristine 2mg , Prednisone 200mg  D1-5, dasatinib 70 mg  09/20/22: C5 Maintenance: vincristine 2mg , Prednisone 200mg  D1-5, dasatinib 70 mg  10/04/22: Bone marrow biopsy - continued remission, MRD and BCR/ABL p190 negative  10/18/22: C6 Maintenance. ANC 0.4, HOLD dasatinib, continue vincristine 2mg  and Prednisone 200mg  D1-5  11/15/22: C7 Maintenance ANC 0.4. HOLD TKI, continue vincristine 2mg  and Prednisone 200mg  D1-5  ~11/30/22: Start ponatinib 15 mg daily  12/13/22: C8 Maintenance ANC 1.2. ponatinib 15mg , vincristine 2mg  D1 and Prednisone 200mg  D1-5  01/10/23: C9  Maintenance Anc 0.7. HOLD ponatinib, continue vincristine 2mg  D1 and Prednisone 200mg  D1-5  01/26/23: ANC 1.7, restart ponatinib 15 mg  02/08/23: C10 Maintenance. ANC is 1.4 - continue ponatinib  03/07/23: C11 Maintenance ANC is 1.1. continue ponatinib  04/06/23: C12 Maintenance  04/12/23: Bmbx - continued remission, MRD-negative, BCR/ABL undetectable  05/02/23: C13 Maintenance  05/30/23: C14 Maintenance  06/27/23: C15 Maintenance PB p210 2/100,000  07/23/23: C16 Maintenance PB p210 2/100,000  08/22/23: C17 Maintenance PB p210 4/100,000  09/19/23: C18 Maintenance PB p210 PENDING         Review of Systems:   ROS reviewed and negative except as noted in H and P     Allergies:  Allergies   Allergen Reactions    Erythromycin Hives     Other reaction(s): Not available    Other      Pt cant take ibuprofen due to condition.    Sulfa (Sulfonamide Antibiotics) Anaphylaxis     Other reaction(s): Not available    Azithromycin Hives     Per patient report       Medications:     Current Outpatient Medications:     acetaminophen (TYLENOL 8 HOUR) 650 MG CR tablet, Take 2 tablets (1,300 mg total) by mouth every eight (8) hours as needed for pain., Disp: , Rfl:     albuterol HFA 90 mcg/actuation inhaler, Inhale 2 puffs every six (6) hours as needed for wheezing., Disp: 8 g, Rfl: 1    aspirin (ECOTRIN) 81 MG tablet, Take 1 tablet (81 mg total) by mouth daily., Disp: , Rfl: cetirizine (ZYRTEC) 10 MG tablet, Take 1 tablet (10 mg total) by mouth nightly., Disp: 90 tablet, Rfl: 3    dapsone 100 MG tablet, Take 1 tablet (100 mg total) by mouth daily. TAKE 1 TABLET(100 MG) BY MOUTH DAILY, Disp: 30 tablet, Rfl: 11    escitalopram oxalate (LEXAPRO) 10 MG tablet, Take 1 tablet (10 mg total) by mouth daily., Disp: 90 tablet, Rfl: 2    letermovir (PREVYMIS) 480 mg tablet, Take 1 tablet (480 mg total) by mouth daily., Disp: 28 tablet, Rfl: 5    levalbuterol (XOPENEX) 0.31 mg/3 mL nebulizer solution, Inhale 3 mL (0.31 mg total) by nebulization every four (4) hours as needed for wheezing (cough)., Disp: 45 mL, Rfl: 0    losartan (COZAAR) 50 MG tablet, Take 1 tablet (50 mg total) by mouth daily., Disp: 30 tablet, Rfl: 3    PONATinib (ICLUSIG) 15 mg tablet, Take 1 tablet (15 mg total) by mouth daily. Swallow tablets whole. Do not crush, break, cut or chew tablets., Disp: 30 tablet, Rfl: 5    predniSONE (DELTASONE) 50 MG tablet, Take 4 tablets (200 mg total) by mouth once daily on days 1-5 of each cycle., Disp: 20 tablet, Rfl: 5    rosuvastatin (CRESTOR) 10 MG tablet, Take 1 tablet (10 mg total) by mouth nightly., Disp: 90 tablet, Rfl: 3    valACYclovir (VALTREX) 500 MG tablet, Take 1 tablet (500 mg total) by mouth daily., Disp: 30 tablet, Rfl: 12    Medical History:  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    EBV infection        Social History:  Social History     Social History Narrative    Not on file       Family History:  No family history on file.    Objective:   There were no vitals taken  for this visit.    Physical Exam:  GENERAL: well-appearing  woman, unaccompanied  HEART: Normal color, not excessive pallor.  CHEST/LUNG: Normal work of breathing. No dyspnea with conversation.   ABDOMEN: No obvious distention.    EXTREMITIES: No edema, cyanosis or clubbing.   SKIN: no rashes noted  NEURO EXAM: Grossly intact.       Test Results:  10/17/2023 BCR/ABL, CBC/d and CMP reviewed with patient  Results  LABS  Blood Glucose: 479 (10/17/2023)  WBC: 5.5  Neutrophils: 3.2  PLT: 177  Hb: 12.4  ALT: 56  AST: 27  Total Bilirubin: 0.5  BMP: Glucose 354, Cr 0.51 (10/17/2023)  HbA1c: 7.1 (10/17/2023)  BCR-ABL: 2 (February 2025)

## 2023-10-17 ENCOUNTER — Inpatient Hospital Stay: Admit: 2023-10-17 | Discharge: 2023-10-17 | Payer: PRIVATE HEALTH INSURANCE

## 2023-10-17 ENCOUNTER — Other Ambulatory Visit: Admit: 2023-10-17 | Discharge: 2023-10-17 | Payer: PRIVATE HEALTH INSURANCE

## 2023-10-17 ENCOUNTER — Ambulatory Visit
Admit: 2023-10-17 | Discharge: 2023-10-17 | Payer: PRIVATE HEALTH INSURANCE | Attending: Adult Health | Primary: Adult Health

## 2023-10-17 DIAGNOSIS — C9100 Acute lymphoblastic leukemia not having achieved remission: Principal | ICD-10-CM

## 2023-10-17 DIAGNOSIS — B259 Cytomegaloviral disease, unspecified: Principal | ICD-10-CM

## 2023-10-17 DIAGNOSIS — R739 Hyperglycemia, unspecified: Principal | ICD-10-CM

## 2023-10-17 LAB — CBC W/ AUTO DIFF
BASOPHILS ABSOLUTE COUNT: 0 10*9/L (ref 0.0–0.1)
BASOPHILS RELATIVE PERCENT: 0.6 %
EOSINOPHILS ABSOLUTE COUNT: 0.1 10*9/L (ref 0.0–0.5)
EOSINOPHILS RELATIVE PERCENT: 2.4 %
HEMATOCRIT: 35.7 % (ref 34.0–44.0)
HEMOGLOBIN: 12.4 g/dL (ref 11.3–14.9)
LYMPHOCYTES ABSOLUTE COUNT: 1.6 10*9/L (ref 1.1–3.6)
LYMPHOCYTES RELATIVE PERCENT: 28.7 %
MEAN CORPUSCULAR HEMOGLOBIN CONC: 34.8 g/dL (ref 32.0–36.0)
MEAN CORPUSCULAR HEMOGLOBIN: 32.7 pg — ABNORMAL HIGH (ref 25.9–32.4)
MEAN CORPUSCULAR VOLUME: 94 fL (ref 77.6–95.7)
MEAN PLATELET VOLUME: 9.4 fL (ref 6.8–10.7)
MONOCYTES ABSOLUTE COUNT: 0.5 10*9/L (ref 0.3–0.8)
MONOCYTES RELATIVE PERCENT: 9.6 %
NEUTROPHILS ABSOLUTE COUNT: 3.2 10*9/L (ref 1.8–7.8)
NEUTROPHILS RELATIVE PERCENT: 58.7 %
PLATELET COUNT: 177 10*9/L (ref 150–450)
RED BLOOD CELL COUNT: 3.79 10*12/L — ABNORMAL LOW (ref 3.95–5.13)
RED CELL DISTRIBUTION WIDTH: 18.1 % — ABNORMAL HIGH (ref 12.2–15.2)
WBC ADJUSTED: 5.5 10*9/L (ref 3.6–11.2)

## 2023-10-17 LAB — BASIC METABOLIC PANEL
ANION GAP: 14 mmol/L (ref 5–14)
BLOOD UREA NITROGEN: 16 mg/dL (ref 9–23)
BUN / CREAT RATIO: 31
CALCIUM: 10.2 mg/dL (ref 8.7–10.4)
CHLORIDE: 92 mmol/L — ABNORMAL LOW (ref 98–107)
CO2: 27 mmol/L (ref 20.0–31.0)
CREATININE: 0.51 mg/dL — ABNORMAL LOW (ref 0.55–1.02)
EGFR CKD-EPI (2021) FEMALE: >90 mL/min/{1.73_m2} (ref >=60)
GLUCOSE RANDOM: 354 mg/dL — ABNORMAL HIGH (ref 70–179)
POTASSIUM: 4.7 mmol/L (ref 3.4–4.8)
SODIUM: 133 mmol/L — ABNORMAL LOW (ref 135–145)

## 2023-10-17 LAB — COMPREHENSIVE METABOLIC PANEL
ALBUMIN: 4.2 g/dL (ref 3.4–5.0)
ALKALINE PHOSPHATASE: 80 U/L (ref 46–116)
ALT (SGPT): 56 U/L — ABNORMAL HIGH (ref 10–49)
ANION GAP: 9 mmol/L (ref 5–14)
AST (SGOT): 27 U/L (ref <=34)
BILIRUBIN TOTAL: 0.5 mg/dL (ref 0.3–1.2)
BLOOD UREA NITROGEN: 17 mg/dL (ref 9–23)
BUN / CREAT RATIO: 30
CALCIUM: 10 mg/dL (ref 8.7–10.4)
CHLORIDE: 96 mmol/L — ABNORMAL LOW (ref 98–107)
CO2: 27 mmol/L (ref 20.0–31.0)
CREATININE: 0.56 mg/dL (ref 0.55–1.02)
EGFR CKD-EPI (2021) FEMALE: >90 mL/min/{1.73_m2} (ref >=60)
GLUCOSE RANDOM: 479 mg/dL (ref 70–179)
POTASSIUM: 4.7 mmol/L (ref 3.4–4.8)
PROTEIN TOTAL: 7.5 g/dL (ref 5.7–8.2)
SODIUM: 132 mmol/L — ABNORMAL LOW (ref 135–145)

## 2023-10-17 LAB — SMEAR - BONE MARROW PATIENT

## 2023-10-17 LAB — HEMOGLOBIN A1C
ESTIMATED AVERAGE GLUCOSE: 157 mg/dL
HEMOGLOBIN A1C: 7.1 % — ABNORMAL HIGH (ref 4.8–5.6)

## 2023-10-17 MED ORDER — BLOOD-GLUCOSE METER KIT WRAPPER
0 refills | 0.00 days | Status: CP
Start: 2023-10-17 — End: ?

## 2023-10-17 MED ORDER — VALACYCLOVIR 500 MG TABLET
ORAL_TABLET | Freq: Every day | ORAL | 12 refills | 30.00 days | Status: CP
Start: 2023-10-17 — End: 2024-11-10

## 2023-10-17 MED ORDER — BLOOD GLUCOSE TEST STRIPS
0 refills | 0.00 days | Status: CP
Start: 2023-10-17 — End: ?
  Filled 2023-10-17: qty 1, 30d supply, fill #0

## 2023-10-17 MED ORDER — LANCETS
0 refills | 0.00 days | Status: CP
Start: 2023-10-17 — End: ?
  Filled 2023-10-17: qty 100, 50d supply, fill #0

## 2023-10-17 MED ORDER — METFORMIN 500 MG TABLET
ORAL_TABLET | ORAL | 0 refills | 30.00 days | Status: CP
Start: 2023-10-17 — End: 2023-11-16
  Filled 2023-10-17: qty 46, 30d supply, fill #0

## 2023-10-17 MED ORDER — LETERMOVIR 480 MG TABLET
ORAL_TABLET | Freq: Every day | ORAL | 5 refills | 28.00 days | Status: CP
Start: 2023-10-17 — End: ?
  Filled 2023-10-17: qty 28, 28d supply, fill #0

## 2023-10-17 MED ADMIN — midazolam (VERSED) injection 2 mg: 2 mg | INTRAVENOUS | @ 16:00:00 | Stop: 2023-10-17

## 2023-10-17 MED ADMIN — vinCRIStine (ONCOVIN) 2 mg in sodium chloride (NS) 0.9 % 25 mL IVPB: 2 mg | INTRAVENOUS | @ 19:00:00 | Stop: 2023-10-17

## 2023-10-17 MED ADMIN — heparin, porcine (PF) 100 unit/mL injection 500 Units: 500 [IU] | INTRAVENOUS | @ 20:00:00 | Stop: 2023-10-18

## 2023-10-17 MED ADMIN — midazolam (VERSED) injection 2 mg: 2 mg | INTRAVENOUS | @ 17:00:00 | Stop: 2023-10-17

## 2023-10-17 MED ADMIN — lidocaine (PF) (XYLOCAINE-MPF) 20 mg/mL (2 %) injection 10 mL: 10 mL | @ 16:00:00 | Stop: 2023-10-17

## 2023-10-17 MED ADMIN — heparin, porcine (PF) 100 unit/mL injection 500 Units: 500 [IU] | INTRAVENOUS | @ 16:00:00 | Stop: 2023-10-17

## 2023-10-17 MED FILL — ACCU-CHEK GUIDE TEST STRIPS: 50 days supply | Qty: 100 | Fill #0

## 2023-10-17 NOTE — Unmapped (Signed)
 Performed glucose test on patient. Reading is at 336 mg/dl. Provider is aware of result

## 2023-10-17 NOTE — Unmapped (Signed)
 Date of Service: 10/17/2023      Patient Active Problem List   Diagnosis    Leukocytosis    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)    Acute cough    Dyspepsia    Chronic frontal sinusitis    Pancytopenia (CMS-HCC)    Neutropenia with fever (CMS-HCC)    Blurred vision, bilateral    Anxiety    Hypogammaglobulinemia (CMS-HCC)    Hypokalemia    Acute tonsillitis    Allergic conjunctivitis    Chest pain with low risk for cardiac etiology    Chronic back pain    Chronic pansinusitis    Depression    Dog bite    Generalized anxiety disorder    Heart palpitations    Hydronephrosis, right    Hypertension    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)  C91.00 Hematopathology Order     Hematopathology Order     Cytogenetics Cancer/FISH NON-BLOOD     Cytogenetics Cancer/FISH NON-BLOOD     B-ALL Minimal Residual Disease (MRD), Flow Cytometry, Bone Marrow     B-ALL Minimal Residual Disease (MRD), Flow Cytometry, Bone Marrow     DNA Extract and Hold     DNA Extract and Hold     BCR::ABL1 p190     BCR::ABL1 p190     Clinic Procedure Appointment Request     Clinic Procedure Appointment Request     midazolam (VERSED) injection 2 mg     lidocaine (PF) (XYLOCAINE-MPF) 20 mg/mL (2 %) injection 10 mL          Premedication:  versed 2mg  + 2mg   Driver confirmed: yes    Ordering Provider: [name], MD    Clinician(s) Performing Procedure: Arna Medici, AGNP        Bone Marrow Aspirate and Biopsy, right side    The risks, benefits, and alternatives to the procedure were discussed. All questions were answered. The patient verbalized understanding and signed informed consent. After a time-out in which his patient identifiers were checked by 2 providers, the patient was laid in prone position on the table.   The  posterior superior iliac spine and iliac crest were cleaned, prepped and draped in the usual sterile fashion.     Anesthetic agent used: 2% plain lidocaine.      Utilizing a Ranfac needle, a bone marrow aspiration and biopsy was performed.  Specimen was sent for routine histopathologic stains and sectioning, flow cytometry, cytogenetics, and molecular analysis.     A pressure dressing was applied to the biopsy site.  Patient tolerated the procedure well.  Hemostasis was confirmed upon discharge.     The patient was given verbal instructions for wound care, such as to keep the biopsy site dry, covered for 24 hours, and to call your physician for a temperature > 100.5.  Tylenol may be taken for discomfort.    Details of Procedure:  Technologist Present:Melissa Pollie Meyer  Spicules Confirmed Bedside:yes  Length of Core Biopsy: 1.5cm  Additional Details:None    Specimens Collected:  EDTA x 2  Heparin x 2  Core biopsy x 1

## 2023-10-17 NOTE — Unmapped (Signed)
 1151 Port with brisk blood return noted. Wasted of 10 ml than labs drawn, flushed with 20 ml normal saline followed with 500 units heparin. Blood sent for analysis.

## 2023-10-17 NOTE — Unmapped (Signed)
 Please leave dressing in place and keep it dry for 24 hrs before removing. You can resume normal activities tomorrow, but take things easy today.     You may take tylenol as needed for discomfort at the area where the biopsy was taken.   After receiving Ativan, please do not drive today.     If you have questions between 8am to 5 pm Monday through Friday please call [phone] and speak to the operator.      For emergencies, evenings or weekends, please call [phone] and ask for oncology fellow on call.     Reasons to call emergency line may include:   Fever of 100.5 or greater   Nausea and/or vomiting not relieved with nausea medicine   Diarrhea or constipation   Severe pain not relieved with usual pain regimen       Lab on 10/17/2023   Component Date Value Ref Range Status    Sodium 10/17/2023 132 (L)  135 - 145 mmol/L Final    Potassium 10/17/2023 4.7  3.4 - 4.8 mmol/L Final    Chloride 10/17/2023 96 (L)  98 - 107 mmol/L Final    CO2 10/17/2023 27.0  20.0 - 31.0 mmol/L Final    Anion Gap 10/17/2023 9  5 - 14 mmol/L Final    BUN 10/17/2023 17  9 - 23 mg/dL Final    Creatinine 42/70/6237 0.56  0.55 - 1.02 mg/dL Final    BUN/Creatinine Ratio 10/17/2023 30   Final    eGFR CKD-EPI (2021) Female 10/17/2023 >90  >=60 mL/min/1.54m2 Final    eGFR calculated with CKD-EPI 2021 equation in accordance with [employer] and [employer] of Nephrology Task Force recommendations.    Glucose 10/17/2023 479 (HH)  70 - 179 mg/dL Final    Calcium 62/83/1517 10.0  8.7 - 10.4 mg/dL Final    Albumin 61/60/7371 4.2  3.4 - 5.0 g/dL Final    Total Protein 10/17/2023 7.5  5.7 - 8.2 g/dL Final    Total Bilirubin 10/17/2023 0.5  0.3 - 1.2 mg/dL Final    AST 01/25/9484 27  <=34 U/L Final    ALT 10/17/2023 56 (H)  10 - 49 U/L Final    Alkaline Phosphatase 10/17/2023 80  46 - 116 U/L Final    WBC 10/17/2023 5.5  3.6 - 11.2 10*9/L Final    RBC 10/17/2023 3.79 (L)  3.95 - 5.13 10*12/L Final    HGB 10/17/2023 12.4  11.3 - 14.9 g/dL Final    HCT 46/27/0350 35.7  34.0 - 44.0 % Final    MCV 10/17/2023 94.0  77.6 - 95.7 fL Final    MCH 10/17/2023 32.7 (H)  25.9 - 32.4 pg Final    MCHC 10/17/2023 34.8  32.0 - 36.0 g/dL Final    RDW 09/38/1829 18.1 (H)  12.2 - 15.2 % Final    MPV 10/17/2023 9.4  6.8 - 10.7 fL Final    Platelet 10/17/2023 177  150 - 450 10*9/L Final    Neutrophils % 10/17/2023 58.7  % Final    Lymphocytes % 10/17/2023 28.7  % Final    Monocytes % 10/17/2023 9.6  % Final    Eosinophils % 10/17/2023 2.4  % Final    Basophils % 10/17/2023 0.6  % Final    Absolute Neutrophils 10/17/2023 3.2  1.8 - 7.8 10*9/L Final    Absolute Lymphocytes 10/17/2023 1.6  1.1 - 3.6 10*9/L Final    Absolute Monocytes 10/17/2023  0.5  0.3 - 0.8 10*9/L Final    Absolute Eosinophils 10/17/2023 0.1  0.0 - 0.5 10*9/L Final    Absolute Basophils 10/17/2023 0.0  0.0 - 0.1 10*9/L Final    Anisocytosis 10/17/2023 Slight (A)  Not Present Final    Hemoglobin A1C 10/17/2023 7.1 (H)  4.8 - 5.6 % Final    Estimated Average Glucose 10/17/2023 157  mg/dL Final    Glucose, POC 09/81/1914 336 (H)  70 - 179 mg/dL Final

## 2023-10-17 NOTE — Unmapped (Signed)
 VISIT SUMMARY:    During today's visit, we discussed your ongoing treatment for Philadelphia chromosome-positive B-cell acute lymphoblastic leukemia (Ph+ B-ALL) and addressed several new symptoms and concerns. We reviewed your current medications and planned for a bone marrow biopsy to assess your treatment progress. We also discussed your elevated blood glucose levels and new symptoms of peripheral neuropathy.    YOUR PLAN:    -PHIL[place]PH[place] CHROMOSOME-POSITIVE B-CELL ACUTE LYMPHOBLASTIC LEUKEMIA (PH+ B-ALL): Ph+ B-ALL is a type of cancer that affects the white blood cells and is characterized by the presence of the [place] chromosome. You are currently undergoing maintenance therapy with ponatinib, vincristine, and prednisone. We will perform a bone marrow biopsy today to assess your current status and may adjust your treatment based on the results. Continue taking ponatinib 15 mg orally daily and receive vincristine 2 mg IV as scheduled. We will hold off on prednisone until we further assess your blood glucose levels.    -HYPERGLYCEMIA: Hyperglycemia means high blood sugar levels. Your blood glucose was elevated, and we have started you on metformin 500 mg orally daily with food for 2 weeks, then increasing to 500 mg twice daily for 2 weeks We plan to further increase the dose to 1 g twice daily as tolerated. You will also receive a glucose monitoring kit and should check your glucose levels in the morning and before dinner, keeping a journal of the readings. Please contact your primary care provider for further diabetes management.    -PERIPHERAL NEUROPATHY: Peripheral neuropathy is a condition that results in numbness and tingling, often in the hands and feet. You reported these symptoms in your right thumb and toes. We will monitor these symptoms closely.    INSTRUCTIONS:    Please follow up with your primary care provider for further diabetes management. Continue to monitor your blood glucose levels as instructed and keep a journal of your readings. We will contact you with the results of your bone marrow biopsy and discuss any necessary adjustments to your treatment plan.

## 2023-10-17 NOTE — Unmapped (Signed)
 Patient here for Bone Marrow Biopsy with Anglea NP. Timeout complete at bedside where all questions and concerns were addressed and consent was obtained. Patient was premedicated. Bone Marrow Biopsy complete with no issues and patient tolerated it well. Patient discharged Using Wheelchair in stable condition. Pt sleeping. Chemo plan verified and awaiting from pharmacy. Pt agreeable to plan of care.   Tolerated tx post bx without difficulty. VAD deaccessed per protocol, 2x2 and tape to site. Discharged home amb with husband, asymptomatic. Declined AVS. m

## 2023-10-18 DIAGNOSIS — C9100 Acute lymphoblastic leukemia not having achieved remission: Principal | ICD-10-CM

## 2023-10-19 DIAGNOSIS — R739 Hyperglycemia, unspecified: Principal | ICD-10-CM

## 2023-10-19 DIAGNOSIS — C9100 Acute lymphoblastic leukemia not having achieved remission: Principal | ICD-10-CM

## 2023-10-19 MED ORDER — LOSARTAN 50 MG TABLET
ORAL_TABLET | Freq: Every day | ORAL | 3 refills | 30.00 days
Start: 2023-10-19 — End: 2024-10-18

## 2023-10-19 NOTE — Unmapped (Signed)
 Pt is requesting refill    Most recent clinic visit: 10/17/2023  Next clinic visit:  11/01/2023

## 2023-10-19 NOTE — Unmapped (Signed)
 New patient referral faxed to [facility].

## 2023-10-22 DIAGNOSIS — C9100 Acute lymphoblastic leukemia not having achieved remission: Principal | ICD-10-CM

## 2023-10-25 ENCOUNTER — Encounter: Admit: 2023-10-25 | Discharge: 2023-10-26

## 2023-10-25 DIAGNOSIS — C9100 Acute lymphoblastic leukemia not having achieved remission: Principal | ICD-10-CM

## 2023-10-25 MED ADMIN — immun glob G(IgG)-pro-IgA 0-50 (PRIVIGEN) 10 % intravenous solution 30 g: .4 g/kg | INTRAVENOUS | @ 15:00:00 | Stop: 2023-10-25

## 2023-10-25 MED ADMIN — diphenhydrAMINE (BENADRYL) capsule/tablet 25 mg: 25 mg | ORAL | @ 15:00:00 | Stop: 2023-10-25

## 2023-10-25 MED ADMIN — heparin, porcine (PF) 100 unit/mL injection 500 Units: 500 [IU] | INTRAVENOUS | @ 17:00:00 | Stop: 2023-10-25

## 2023-10-25 MED ADMIN — acetaminophen (TYLENOL) tablet 650 mg: 650 mg | ORAL | @ 15:00:00 | Stop: 2023-10-25

## 2023-10-25 NOTE — Unmapped (Signed)
 Pt presents for IVIG.  VSS, pt weight is 86.3 kg. Right chest lateral port accessed as per protocol with brisk return of blood . Patient made aware for potential reaction/side effects, call bell within reach.Her blood sugar is high recently ad her referral is I progress she was frustrated notable to eat and control on her blood sugar. Recently diagnosis of DM.Rn Brett Canales was helping him  to get a hold of endocrinology.        11  05 IVIG 30 g started, to infuse at the following rates:     17ml/hr for 15 min  55ml/hr for 30 min  143ml/hr for 30 min  414 ml/hr until complete.  1252 IVIG complete.  Pt tolerated without complication, VSS.  Right chest port lateral flushed and heparinized as per protocol . Patient tolerated well without complicationsIV flushed per policy .  Pt left clinic in no acute distress

## 2023-10-31 DIAGNOSIS — C9100 Acute lymphoblastic leukemia not having achieved remission: Principal | ICD-10-CM

## 2023-10-31 NOTE — Unmapped (Unsigned)
 [facility]ukemia Clinic Follow Up Visit Note     [name]  Patient Age: 48 y.o.  Encounter Date: 11/01/2023    Primary Care Provider:  [name], MD    Referring Physician:  Lenon Ahmadi, AGNP  [address]  [id number]  [place],  [place] [address]    Cancer Diagnosis: Ph+ B-ALL; Initial Dx 10/29/2021  Cancer status: CR1, BCR/ABL p190 negative in marrow  Treatment Regimen: First Line, ponatinib + Maintenance (vincristine, prednisone) s/p truncated course of GRAAPH-2005, HyperCVAD  Treatment Goal: Curative  Comorbidities: HTN, anxiety, hemorrhoids with anal fissures  Transplant: referral deferred in CR1    Assessment/Plan:  KOYA HUNGER is a 48 y.o. female with past medical history of chronic right shoulder and back pain, hypertension, anxiety, and recently diagnosed Ph+ ALL. 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  Given her deep molecular remission and her intolerance of hyperCVAD, we are transitioned to maintenance chemotherapy.     Increase ponatinib to 30 mg. Follow PB p210 monthly. Repeat bmbx in 3-6 months.   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 On GRAAPH 2005 + dasatinib. Completed 12/12 planned IT treatments for CNS ppx.  Slight increase in p210 47/100,000 transcripts positive. Deep remission maintained, negative by flow cytometry. Increase concerning but not immediate relapse. Decision to increase ponatinib based on low disease level to avoid aggressive treatments. Risks of increased dosage explained and agreed upon.  - Increase ponatinib dose to 30 mg.  - Monitor blood counts monthly for P210.  - Schedule bone marrow biopsy in three months.  - Continue vincristine and prednisone schedule.  - Maintain low-dose aspirin therapy.  - Monitor for adverse effects: shortness of breath, dizziness, arm pain.    Hyperglycemia  Glucose levels fluctuating, recent high of 479 mg/dL, morning levels ~981 mg/dL. Insufficient pancreatic insulin production. NPH insulin addition planned for steroid use. Dietary changes made to reduce carbohydrates.  - Prescribe NPH insulin for steroid use.  - Continue insulin glargine at 6 units nightly.  - Monitor glucose levels regularly.    Peripheral neuropathy due to chemotherapy  Reports numbness and tingling in the right thumb and toes, consistent with peripheral neuropathy.  - Monitor symptoms of peripheral neuropathy.    Neck and Spine Pain  Excruciating pain suspected from pinched nerve in lower spine. Temporary relief from massage. Exploring non-pharmacological treatments.  - Consider further evaluation for potential pinched nerve.  - Continue magnesium salves and essential oils for symptom relief.    Pancytopenia due to chemotherapy  - ANC and Platelets sufficient to proceed without changes 11/01/2023   - supportive care as detailed below when in nadir    HTN: ponatinib likely contributes  - Losartan - start 02/08/23  - monitor BP trend at home    Hx CMV viremia: Dr. 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 09/2022 restarted Valcyte with the goal of controlling CMV and limiting neutropenia  - continue letermovir  - d/c CMV levels as of 03/2023 - per Dr. Kari Baars  - 09/19/23 - CMV negative - checked due to elevated temps    Hypogammaglobulinemia:   - Monthly IVIG started 07/26/22 - now at [facility] due to insurance coverage    Left knee pain  - 08/22/23 - will refer to orthopedics is pain persists/worsens    Hemorrhoids, Anal fissure: persistent but stable, per  patient  - supportive care at home    Menses suppression and birth control: previously received Lupron with last dose 07/26/22   - patient stopped oral contraceptive due to weight loss. Counseled on importance of birth control on TKI. She is abstinent and making an appointment with GYN       Psychosocial distress: She reports a moderate level of anxiety regarding the management of the above.   - Counseling given   - Consider ref to comprehensive cancer support program     Patient-centered care/Shared decision-making:   We discussed the plan above at length. The patient and her husband actively contributed to the conversation. Specifically, her most important outcomes are:   Prolonging overall survival and Maintaining her overall functional activity     Supportive Care Needs: We recommend based on the patient???s underlying diagnosis and treatment history the following supportive care:    1. Antimicrobial prophylaxis:    Viral - valacyclovir and letermovir  Bacterial: Levofloxacin when ANC <0.5  Fungal: Fluconazole 200mg  when ANC <0.5  PJP: dapsone 100mg  daily    2. Blood product support:  Leukoreduced blood products are required.  Irradiated blood products are preferred, but in case of urgent transfusion needs non-irradiated blood products may be used: 2 units for Hg <=8.0.     Coordination of care:   - Continue maintenance q4 weeks    I personally spent 40 minutes face-to-face and non-face-to-face in the care of this patient, which includes all pre, intra, and post visit time on the date of service.  All documented time was specific to the E/M visit and does not include any procedures that may have been performed.    [name], MD  Leukemia Program  Division of Hematology  [facility]    Nurse Navigator (non-clinical trial patients): Luiz Iron, RN        Tel. [phone]       Fax. [phone]  Toll-free appointments: [phone]  Scheduling assistance: [phone]  After hours/weekends: [phone] (ask for adult hematology/oncology on-call)      History of Present Illness:   Patricia [name] is a 48 y.o. female with past medical history noted as above who presents for follow up Ph+ ALL.      Her social history is notable for being a Runner, broadcasting/film/video.  She was recently married. She lives with her husband.    Interim history    History of Present Illness  The patient, with diabetes and leukemia, presents for management of these conditions.    She experiences significant fluctuations in blood glucose levels, with recent highs reaching 479 mg/dL. Despite dietary changes, such as reducing carbohydrate intake, her glucose levels remain variable, ranging from 412 mg/dL to below 027 mg/dL. She is currently on insulin glargine, 6 units at night, and uses a continuous glucose monitor to track her levels.    She is in remission from leukemia but has noted a slight increase in leukemia cells, with a recent test showing 47 cells out of 100,000. She is on a low dose of ponatinib, 15 mg, which was reduced due to previous immune system suppression.    She experiences excruciating neck pain that radiates into her skull, particularly after prolonged sitting. The pain is debilitating, requiring her to lie down to alleviate symptoms. Massages have provided some relief for muscle tightness in her neck and shoulders. She suspects a lower spine issue may be contributing to her symptoms.    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 She uses essential oils and herbal remedies for personal care, ensuring they do not interact with her medications. She reports variable energy levels, which she attributes to her neck issues and the cold winter limiting her activity. She is trying to increase her physical activity as her symptoms allow.        Oncology History is as below:   Hematology/Oncology History Overview Note   Referring/Local Oncologist:    Diagnosis:   10/29/2021  Bone marrow, left iliac, aspiration and biopsy  -  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 The majority of the abnormal cells (64/88) contained an additional BCR/ABL1 fusion signal, while 9/88 abnormal cells contained an additional ABL1 and ASS1 signal.       Genetics:   Karyotype/FISH:   RESULTS   Date Value Ref Range Status   10/28/2021   Final    NOTE: This report reflects a combined study from a peripheral blood and a bone marrow core biopsy. Eleven cells from the peripheral blood and nine cells from the bone marrow core biopsy were analyzed. The BCR/ABL1 FISH analysis was performed on the peripheral blood.     Abnormal Karyotype:  47,XX,t(9;22)(q34;q11.2),+der(22)t(9;22)[18]/46,XX[2]    Abnormal FISH:  A BCR/ABL1 interphase FISH assay showed a signal pattern consistent with a BCR::ABL1 rearrangement and the 9;22 translocation in 88% of the 100 cells scored. The majority of the abnormal cells (64/88) contained an additional BCR/ABL1 fusion signal, while 9/88 abnormal cells contained an additional ABL1 and ASS1 signal.           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  MRD by flow insufficient, p190 2/100,000  12/10/2021: Cycle 2 B cycle + rituximab  12/14/21: IT#4  12/17/21: IT#5  01/10/22: Cycle 3 A cycle + rituximab  01/11/22: IT #6  01/24/22: Bmbx - MRD-neg by PCR (p190)   02/17/22: C4 B cycle + ritux  02/18/22: IT#7  03/21/22: C5 A cycle + ritux (4th dose)  03/22/22: IT#8  04/29/22: Bmbx - MRD-neg by PCR (p190)   05/18/22: restart dasatinib 70mg  s/p neutropenia  05/26/22: Start maintenance vincristine 2mg , Prednisone 200mg  D1-5   06/02/22: IT #9  06/27/22: C2 Maintenance: vincristine 2mg , Prednisone 200mg  D1-5, HOLD dasatinib due to neutropenia  07/04/22: RESTART dasatinib 70mg   07/21/22: IT #10  07/26/22: C3 Maintenance: vincristine 2mg , Prednisone 200mg  D1-5, dasatinib 70 mg  08/08/22: IT #11 - prophylaxis completed  08/23/22: C4 Maintenance: vincristine 2mg , Prednisone 200mg  D1-5, dasatinib 70 mg  09/20/22: C5 Maintenance: vincristine 2mg , Prednisone 200mg  D1-5, dasatinib 70 mg  10/04/22: Bone marrow biopsy - continued remission, MRD and BCR/ABL p190 negative  10/18/22: C6 Maintenance. ANC 0.4, HOLD dasatinib, continue vincristine 2mg  and Prednisone 200mg  D1-5  11/15/22: C7 Maintenance ANC 0.4. HOLD TKI, continue vincristine 2mg  and Prednisone 200mg  D1-5  ~11/30/22: Start ponatinib 15 mg daily  12/13/22:  C8 Maintenance ANC 1.2. ponatinib 15mg , vincristine 2mg  D1 and Prednisone 200mg  D1-5  01/10/23: C9 Maintenance Anc 0.7. HOLD ponatinib, continue vincristine 2mg  D1 and Prednisone 200mg  D1-5  01/26/23: ANC 1.7, restart ponatinib 15 mg  02/08/23: C10 Maintenance. ANC is 1.4 - continue ponatinib  03/07/23: C11 Maintenance ANC is 1.1. continue ponatinib  04/06/23: C12 Maintenance  04/12/23: Bmbx - continued remission, MRD-negative, BCR/ABL undetectable  05/02/23: C13 Maintenance  05/30/23: C14 Maintenance  06/27/23: C15 Maintenance PB p210 2/100,000  07/23/23: C16 Maintenance PB p210 2/100,000  08/22/23: C17 Maintenance PB p210 4/100,000  09/19/23: C18 Maintenance PB p210 2/100,000  10/16/23: C19 Maintenance - HOLD prednisone due to hyperglycemia  10/16/23: Bmbx - CR, MRD-neg by flow, p210 47/100,000          Review of Systems:   ROS reviewed and negative except as noted in H and P     Allergies:  Allergies   Allergen Reactions    Erythromycin Hives     Other reaction(s): Not available    Other      Pt cant take ibuprofen due to condition.    Sulfa (Sulfonamide Antibiotics) Anaphylaxis     Other reaction(s): Not available    Azithromycin Hives     Per patient report       Medications:     Current Outpatient Medications: acetaminophen (TYLENOL 8 HOUR) 650 MG CR tablet, Take 2 tablets (1,300 mg total) by mouth every eight (8) hours as needed for pain., Disp: , Rfl:     albuterol HFA 90 mcg/actuation inhaler, Inhale 2 puffs every six (6) hours as needed for wheezing., Disp: 8 g, Rfl: 1    aspirin (ECOTRIN) 81 MG tablet, Take 1 tablet (81 mg total) by mouth daily., Disp: , Rfl:     blood sugar diagnostic (GLUCOSE BLOOD) Strp, Use to check blood sugar two times a day., Disp: 100 each, Rfl: 0    blood-glucose meter kit, Use to check blood sugar two times a day. Use as instructed., Disp: 1 each, Rfl: 0    cetirizine (ZYRTEC) 10 MG tablet, Take 1 tablet (10 mg total) by mouth nightly., Disp: 90 tablet, Rfl: 3    dapsone 100 MG tablet, Take 1 tablet (100 mg total) by mouth daily. TAKE 1 TABLET(100 MG) BY MOUTH DAILY, Disp: 30 tablet, Rfl: 11    escitalopram oxalate (LEXAPRO) 10 MG tablet, Take 1 tablet (10 mg total) by mouth daily., Disp: 90 tablet, Rfl: 2    lancets Misc, Use to check blood sugar as directed 2 times a day & for symptoms of high or low blood sugar., Disp: 100 each, Rfl: 0    letermovir (PREVYMIS) 480 mg tablet, Take 1 tablet (480 mg total) by mouth daily., Disp: 28 tablet, Rfl: 5    levalbuterol (XOPENEX) 0.31 mg/3 mL nebulizer solution, Inhale 3 mL (0.31 mg total) by nebulization every four (4) hours as needed for wheezing (cough)., Disp: 45 mL, Rfl: 0    losartan (COZAAR) 50 MG tablet, Take 1 tablet (50 mg total) by mouth daily., Disp: 30 tablet, Rfl: 3    metFORMIN (GLUCOPHAGE) 500 MG tablet, Take 1 tablet (500 mg total) by mouth daily for 14 days, THEN 1 tablet (500 mg total) 2 (two) times a day with meals for 16 days., Disp: 46 tablet, Rfl: 0    PONATinib (ICLUSIG) 30 mg tablet, Take 1 tablet (30 mg total) by mouth daily. Swallow tablets whole. Do not crush, break, cut or chew tablets., Disp: 30  tablet, Rfl: 5    predniSONE (DELTASONE) 50 MG tablet, Take 4 tablets (200 mg total) by mouth once daily on days 1-5 of each cycle., Disp: 20 tablet, Rfl: 5    rosuvastatin (CRESTOR) 10 MG tablet, Take 1 tablet (10 mg total) by mouth nightly., Disp: 90 tablet, Rfl: 3    valACYclovir (VALTREX) 500 MG tablet, Take 1 tablet (500 mg total) by mouth daily., Disp: 30 tablet, Rfl: 12    Medical History:  Past Medical History:   Diagnosis Date    B-cell acute lymphoblastic leukemia (ALL) 10/29/2021    EBV infection        Social History:  Social History     Social History Narrative    Not on file       Family History:  No family history on file.    Objective:   There were no vitals taken for this visit.    Physical Exam:  GENERAL: well-appearing  woman, unaccompanied, on video   HEART: Normal color, not excessive pallor.  CHEST/LUNG: Normal work of breathing. No dyspnea with conversation.       Test Results:  11/01/2023 BCR/ABL, CBC/d and CMP reviewed with patient  Results  LABS  Blood Glucose: 479 (10/17/2023)  WBC: 5.5  Neutrophils: 3.2  PLT: 177  Hb: 12.4  ALT: 56  AST: 27  Total Bilirubin: 0.5  BMP: Glucose 354, Cr 0.51 (10/17/2023)  HbA1c: 7.1 (10/17/2023)  BCR-ABL: 2 (February 2025)      LABS  Continuous blood glucose monitor: 324 mg/dL (41/32/4401)  Continuous blood glucose monitor: 412 mg/dL (02/72/5366)  Continuous blood glucose monitor: 210 mg/dL (44/09/4740)  Continuous blood glucose monitor: <200 mg/dL (59/56/3875)    RADIOLOGY  Neck X-ray: No significant abnormalities    PATHOLOGY  Flow cytometry: 47 cells positive for leukemia out of 100,000  Bone marrow biopsy: Negative for leukemia      The patient reports they are physically located in [place] and is currently: at home. I conducted a audio/video visit. I spent  68m 20s on the video call with the patient. I spent an additional 5 minutes on pre- and post-visit activities on the date of service . wheezing (cough)., Disp: 45 mL, Rfl: 0    losartan (COZAAR) 50 MG tablet, Take 1 tablet (50 mg total) by mouth daily., Disp: 30 tablet, Rfl: 3    metFORMIN (GLUCOPHAGE) 500 MG tablet, Take 1 tablet (500 mg total) by mouth daily for 14 days, THEN 1 tablet (500 mg total) 2 (two) times a day with meals for 16 days., Disp: 46 tablet, Rfl: 0    PONATinib (ICLUSIG) 15 mg tablet, Take 1 tablet (15 mg total) by mouth daily. Swallow tablets whole. Do not crush, break, cut or chew tablets., Disp: 30 tablet, Rfl: 5    predniSONE (DELTASONE) 50 MG tablet, Take 4 tablets (200 mg total) by mouth once daily on days 1-5 of each cycle., Disp: 20 tablet, Rfl: 5    rosuvastatin (CRESTOR) 10 MG tablet, Take 1 tablet (10 mg total) by mouth nightly., Disp: 90 tablet, Rfl: 3    valACYclovir (VALTREX) 500 MG tablet, Take 1 tablet (500 mg total) by mouth daily., Disp: 30 tablet, Rfl: 12    Medical History:  Past Medical History:   Diagnosis Date    B-cell acute lymphoblastic leukemia (ALL) 10/29/2021    EBV infection        Social History:  Social History     Social History Narrative  Not on file       Family History:  No family history on file.    Objective:   There were no vitals taken for this visit.    Physical Exam:  GENERAL: well-appearing  woman, unaccompanied  HEART: Normal color, not excessive pallor.  CHEST/LUNG: Normal work of breathing. No dyspnea with conversation.   ABDOMEN: No obvious distention.    EXTREMITIES: No edema, cyanosis or clubbing.   SKIN: no rashes noted  NEURO EXAM: Grossly intact.       Test Results:  11/01/2023 BCR/ABL, CBC/d and CMP reviewed with patient  Results  LABS  Blood Glucose: 479 (10/17/2023)  WBC: 5.5  Neutrophils: 3.2  PLT: 177  Hb: 12.4  ALT: 56  AST: 27  Total Bilirubin: 0.5  BMP: Glucose 354, Cr 0.51 (10/17/2023)  HbA1c: 7.1 (10/17/2023)  BCR-ABL: 2 (February 2025)      LABS  Continuous blood glucose monitor: 324 mg/dL (09/60/4540)  Continuous blood glucose monitor: 412 mg/dL (98/06/9146)  Continuous blood glucose monitor: 210 mg/dL (82/95/6213)  Continuous blood glucose monitor: <200 mg/dL (08/65/7846)    RADIOLOGY  Neck X-ray: No significant abnormalities    PATHOLOGY  Flow cytometry: 47 cells positive for leukemia out of 100,000  Bone marrow biopsy: Negative for leukemia

## 2023-11-01 ENCOUNTER — Encounter: Admit: 2023-11-01 | Discharge: 2023-11-02 | Attending: Hematology | Primary: Hematology

## 2023-11-01 DIAGNOSIS — C9100 Acute lymphoblastic leukemia not having achieved remission: Principal | ICD-10-CM

## 2023-11-01 NOTE — Unmapped (Signed)
 Pt confirmed PHONE visit and meds

## 2023-11-03 DIAGNOSIS — C9100 Acute lymphoblastic leukemia not having achieved remission: Principal | ICD-10-CM

## 2023-11-03 MED ORDER — PONATINIB 30 MG TABLET
ORAL_TABLET | Freq: Every day | ORAL | 5 refills | 30 days | Status: CP
Start: 2023-11-03 — End: ?
  Filled 2023-11-08: qty 30, 30d supply, fill #0

## 2023-11-05 DIAGNOSIS — C9100 Acute lymphoblastic leukemia not having achieved remission: Principal | ICD-10-CM

## 2023-11-06 DIAGNOSIS — C9100 Acute lymphoblastic leukemia not having achieved remission: Principal | ICD-10-CM

## 2023-11-06 NOTE — Unmapped (Signed)
 Called and spoke with patient to assess who manages her diabetes and which insulin patient is currently taking. Per patient, Patricia Ponder, PA with [facility] in [place] is managing her diabetes. She did mention that she stopped taking her Metformin this past Saturday because she is having severe neck pain that is radiating throughout her head. She stated  I looked it up and Metformin interacts with aspirin, insulin and my Inclusig. All of these are medications she is on currently and she believes the Metformin is causing her to have spasms. Dr. Senaida Ores and [name], NP was sent a message to make them aware. Patient plans to let the Cassandra, PA know she stopped the Metformin as well. She is currently only taking 8 units of Lantus pen injector at bedtime. She started the Lantus on 4/2 after seeing Cassandra, PA . Last blood sugar today was 274 after eating.

## 2023-11-06 NOTE — Unmapped (Signed)
 OUTPATIENT ONCOLOGY PALLIATIVE CARE    Principal Diagnosis: Patricia [name] is a 48 y.o. 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    Assessment/Plan:     #Anxiety/Coping Support: Up and down, a little more anxious recently in setting of hyperglycemia. Uses excellent coping strategies. Briefly stopped lexapro but then restarted when noticed worsening ability to cope and more frequent emotional breakdowns. Since restarting she is doing well on her current dose and reports no recent panic attacks. Does note some undesired sexual side effects that she is considering discussing with gyn alternative management approaches.   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    #Peripheral Neuropathy: Stable overall, comes and goes. 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   - discussed trial of capsaicin cream - patient has not tried but she still plans to  - She also is working on an herbal salve and will send ingredients  - could also roate from lexapro to duloxetine in the future if more severe    #Hyperglycemia/DM: Worse. Now following with endocrinology. Fortunately not having significant symptoms.  - Stopped taking metformin due to side effects of muscle pain/tinnitus  - Insulin per endocrine    #Cancer-Related Fatigue: Stable overall.  - Encouraged light exercise as has not engaged in this as much lately  - Future Considerations: American Ginseng     #Cancer-Related Pain: Worse, which she attirbutes to metformin. Has improved since stopping metformin. HAs/Muscle aches intermittnetly. Responsive to tylenol, excedrine, and tizanidine which she takes very intermittently.   - Cont tylenol PRN  - Cont tizandine PRN - has not been very helpful    #Advanced Care Planning: Worries about recurrence, but uses coping strategies to manage. Trying to trust that she is still in deep remission even with slightly elevated BCR-ABL.     PRior Convos:  - She and her husband have an excellent understanding of the current course of her treatment.  She recognizes that she is responding well and there is some uncertainty about how many rounds of treatment she will have to go through.  Has heard that the goal of treatment is curative, and she remains very hopeful for this.  Has been able to tolerate treatment fairly well to this point. Her major concern is about side effects from lumbar punctures.  Current focus is on cancer treatment and survival while maintaining quality of life is much as possible.    Health Care Decision Maker as of 11/06/2023    HCDM (patient stated preference): Patricia [name] Spouse - [phone]      # Controlled substances risk management.   - Patient does not have a signed pain medication agreement with our team.  No controlled substances being prescribed by our team currently.    - [place] database was reviewed today and it was appropriate.   - Urine drug screen was not performed at this visit. Findings: not applicable.   - Patient has received information about safe storage and administration of medications.   - Patient has not received a prescription for narcan; is not applicable.       F/u: 12 weeks    ----------------------------------------  Referring Provider: From inpatient palliative care team  Oncology Team: Malignant hematology  PCP: Tanya Fantasia, MD  Interval Hx, 11/07/23:    History of Present Illness  No major issues since our last visit. BCR-ABL count up to 47/100,000. BMBx without evidence of leukemia.     Today, she shares recently underwent a bone marrow biopsy which showed no detectable leukemia, but her BCR-ABL count is 47 out of 100,000 cells, an increase from a previous count of 2. Despite this increase, she remains in deep remission per oncology. She is trying to trust that, but feels anxious about the rise.     Most pressing recently has been worsening hyperglycemia in setting of prednisone. She does not experience typical symptoms of diabetes such as frequent urination or excessive thirst, but she does have hunger and blood sugars getting in the 400s. Her blood sugar has been consistently high, around 200 or higher, with occasional drops to 170. She discontinued metformin due to severe side effects, including neck muscle pain, headaches, and muscle cramps. She is now established with endocrine has started using an insulin pen, initially at 6 units, and increased to 8 units, which has helped stabilize her blood sugar. She has not updated them about the metformin yet.     She describes pain that began within 24-48 hours of a bone marrow biopsy and starting metformin. The pain starts at the C1 area and radiates throughout her skull, causing pulsating sensations in her head. She found some relief with Excedrin migraine. She also reports muscle cramps, particularly in her calves. Of note, she feels like symptoms have notably improved with cessation of metformin.     She continues to have mild neuropathy symptoms, including numbness and a pins-and-needles sensation in her fingers and toes, which she attributes to high blood sugar levels. She has not trialed capsaicin cream for relief and reports that her symptoms fluctuate.    She mentions sleep disturbances at times. She has been unable to work on her computer due to lack of energy and concentration. No fever or chills.    Despite some of these symptoms, she is coping pretty well. Still engaging in activities she enjoys and able to work on her business intermittently.           HPI: Ph+ ALL, currently in remission.    Current cancer-directed therapy: maintenance vincristine 2g, Prednisone 200mg  D1-4 +dasatinib       Palliative Performance Scale: 90% - Ambulation: Full / Normal Activity, some evidence of disease / Self-Care:Full / Intake: Normal / Level of Conscious: Full      Coping/Support Issues: She and her husband have multiple stressors but overall feels like she is coping fairly well right now.  She will reach out to our palliative care team if new concerns emerge.    Goals of Care:  Focus on cancer directed therapy and survival while hoping to maintain her quality of life is much as possible    Social History:   Name of primary support: Husband Patricia [name]  occupation: 6 grade teacher      Advance Care Planning:   HCPOA: Spouse  Natural surrogate decision maker: Husband Patricia [name]  Living Will: no  ACP note:     Objective       Hematology/Oncology History Overview Note   Referring/Local Oncologist:    Diagnosis:   10/29/2021  Bone marrow, left iliac, aspiration and biopsy  -  Hypercellular bone marrow (greater than 90%) involved by B lymphoblastic leukemia (~95%blasts by morphologic assessment of aspirate smears and touch preps)  - Abnormal Karyotype:  47,XX,t(9;22)(q34;q11.2),+der(22)t(9;22)[18]/46,XX[2]  Abnormal FISH:  A BCR/ABL1 interphase FISH assay showed a signal pattern consistent with a BCR::ABL1 rearrangement and the 9;22 translocation in 88% of the 100 cells scored. The majority of the abnormal cells (64/88) contained an additional BCR/ABL1 fusion signal, while 9/88 abnormal cells contained an additional ABL1 and ASS1 signal.       Genetics:   Karyotype/FISH:   RESULTS   Date Value Ref Range Status   10/28/2021   Final    NOTE: This report reflects a combined study from a peripheral blood and a bone marrow core biopsy. Eleven cells from the peripheral blood and nine cells from the bone marrow core biopsy were analyzed. The BCR/ABL1 FISH analysis was performed on the peripheral blood.     Abnormal Karyotype:  47,XX,t(9;22)(q34;q11.2),+der(22)t(9;22)[18]/46,XX[2]    Abnormal FISH:  A BCR/ABL1 interphase FISH assay showed a signal pattern consistent with a BCR::ABL1 rearrangement and the 9;22 translocation in 88% of the 100 cells scored. The majority of the abnormal cells (64/88) contained an additional BCR/ABL1 fusion signal, while 9/88 abnormal cells contained an additional ABL1 and ASS1 signal.           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  MRD by flow insufficient, p190 2/100,000  12/10/2021: Cycle 2 B cycle + rituximab  12/14/21: IT#4  12/17/21: IT#5  01/10/22: Cycle 3 A cycle + rituximab  01/11/22: IT #6  01/24/22: Bmbx - MRD-neg by PCR (p190)   02/17/22: C4 B cycle + ritux  02/18/22: IT#7  03/21/22: C5 A cycle + ritux (4th dose)  03/22/22: IT#8  04/29/22: Bmbx - MRD-neg by PCR (p190)   05/18/22: restart dasatinib 70mg  s/p neutropenia  05/26/22: Start maintenance vincristine 2mg , Prednisone 200mg  D1-5   06/02/22: IT #9  06/27/22: C2 Maintenance: vincristine 2mg , Prednisone 200mg  D1-5, HOLD dasatinib due to neutropenia  07/04/22: RESTART dasatinib 70mg   07/21/22: IT #10  07/26/22: C3 Maintenance: vincristine 2mg , Prednisone 200mg  D1-5, dasatinib 70 mg  08/08/22: IT #11 - prophylaxis completed  08/23/22: C4 Maintenance: vincristine 2mg , Prednisone 200mg  D1-5, dasatinib 70 mg  09/20/22: C5 Maintenance: vincristine 2mg , Prednisone 200mg  D1-5, dasatinib 70 mg  10/04/22: Bone marrow biopsy - continued remission, MRD and BCR/ABL p190 negative  10/18/22: C6 Maintenance. ANC 0.4, HOLD dasatinib, continue vincristine 2mg  and Prednisone 200mg  D1-5  11/15/22: C7 Maintenance ANC 0.4. HOLD TKI, continue vincristine 2mg  and Prednisone 200mg  D1-5  ~11/30/22: Start ponatinib 15 mg daily  12/13/22: C8 Maintenance ANC 1.2. ponatinib 15mg , vincristine 2mg  D1 and Prednisone 200mg  D1-5  01/10/23: C9 Maintenance Anc 0.7. HOLD ponatinib, continue vincristine 2mg  D1 and Prednisone 200mg  D1-5  01/26/23: ANC 1.7, restart ponatinib 15 mg  02/08/23: C10 Maintenance. ANC is 1.4 - continue ponatinib  03/07/23: C11 Maintenance ANC is 1.1. continue ponatinib  04/06/23: C12 Maintenance  04/12/23: Bmbx - continued remission, MRD-negative, BCR/ABL undetectable  05/02/23: C13 Maintenance  05/30/23: C14 Maintenance  06/27/23: C15 Maintenance PB p210 2/100,000  07/23/23: C16 Maintenance PB p210 2/100,000  08/22/23: C17 Maintenance PB p210 4/100,000  09/19/23: C18 Maintenance PB p210 2/100,000  10/16/23: C19 Maintenance - HOLD prednisone due to hyperglycemia  10/16/23: Bmbx - CR, MRD-neg by flow, p210 47/100,000  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)   10/29/2021 Initial Diagnosis    B-cell acute lymphoblastic leukemia (ALL) (CMS-HCC)     10/30/2021 - 03/30/2022 Chemotherapy    IP/OP LEUKEMIA GRAAPH-2005 < 60 YO (OP PEGFILGRASTIM ON DAY 7)  [No description for this plan]     01/06/2022 - 04/07/2022 Endocrine/Hormone Therapy    OP LEUPROLIDE (LUPRON) 11.25 MG EVERY 3 MONTHS  Plan Provider: Tanya Fantasia, MD     05/26/2022 -  Chemotherapy    OP LEUKEMIA VINCRISTINE  vinCRIStine 2 mg IV on day 1     07/26/2022 - 07/26/2022 Endocrine/Hormone Therapy    OP LEUPROLIDE (LUPRON) 11.25 MG EVERY 3 MONTHS  Plan Provider: Tanya Fantasia, MD           REVIEW OF SYSTEMS:  A comprehensive review of 10 systems was negative except for pertinent positives noted in HPI.      PHYSICAL EXAM: Video visit  There were no vitals filed for this visit.    GEN: Awake and alert, pleasant appearing female in no acute distress. Hair is growing back and in pig tails  LUNGS: No increased work of breathing on room air, talking in full sentences.  PSYCH: Alert and oriented to person, place and time. Excited when talking about her projects.  SKIN: No rashes, petechiae or jaundice noted        The patient reports they are physically located in [place]  and is currently: at home. I conducted a audio/video visit. I spent  59m 20s on the video call with the patient. I spent an additional 16 minutes on pre- and post-visit activities on the date of service .     Graciela Lava, MD  [facility]

## 2023-11-07 ENCOUNTER — Encounter
Admit: 2023-11-07 | Discharge: 2023-11-08 | Attending: Student in an Organized Health Care Education/Training Program | Primary: Student in an Organized Health Care Education/Training Program

## 2023-11-07 DIAGNOSIS — C9100 Acute lymphoblastic leukemia not having achieved remission: Principal | ICD-10-CM

## 2023-11-07 DIAGNOSIS — F419 Anxiety disorder, unspecified: Principal | ICD-10-CM

## 2023-11-07 NOTE — Unmapped (Signed)
 dose increased to 30 mg once daily    [facility] Clinical Assessment & Refill Coordination Note    Patricia [name], DOB: [birth year]  Phone: [phone] (home)     All above HIPAA information was verified with patient.     Was a Nurse, learning disability used for this call? No    Specialty Medication(s):   Hematology/Oncology: Iclusig     Current Outpatient Medications   Medication Sig Dispense Refill    acetaminophen (TYLENOL 8 HOUR) 650 MG CR tablet Take 2 tablets (1,300 mg total) by mouth every eight (8) hours as needed for pain.      albuterol HFA 90 mcg/actuation inhaler Inhale 2 puffs every six (6) hours as needed for wheezing. 8 g 1    aspirin (ECOTRIN) 81 MG tablet Take 1 tablet (81 mg total) by mouth daily.      blood sugar diagnostic (GLUCOSE BLOOD) Strp Use to check blood sugar two times a day. 100 each 0    blood-glucose meter kit Use to check blood sugar two times a day. Use as instructed. 1 each 0    cetirizine (ZYRTEC) 10 MG tablet Take 1 tablet (10 mg total) by mouth nightly. 90 tablet 3    dapsone 100 MG tablet Take 1 tablet (100 mg total) by mouth daily. TAKE 1 TABLET(100 MG) BY MOUTH DAILY 30 tablet 11    escitalopram oxalate (LEXAPRO) 10 MG tablet Take 1 tablet (10 mg total) by mouth daily. 90 tablet 2    lancets Misc Use to check blood sugar as directed 2 times a day & for symptoms of high or low blood sugar. 100 each 0    letermovir (PREVYMIS) 480 mg tablet Take 1 tablet (480 mg total) by mouth daily. 28 tablet 5    levalbuterol (XOPENEX) 0.31 mg/3 mL nebulizer solution Inhale 3 mL (0.31 mg total) by nebulization every four (4) hours as needed for wheezing (cough). 45 mL 0    losartan (COZAAR) 50 MG tablet Take 1 tablet (50 mg total) by mouth daily. 30 tablet 3    metFORMIN (GLUCOPHAGE) 500 MG tablet Take 1 tablet (500 mg total) by mouth daily for 14 days, THEN 1 tablet (500 mg total) 2 (two) times a day with meals for 16 days. 46 tablet 0    PONATinib (ICLUSIG) 30 mg tablet Take 1 tablet (30 mg total) by mouth daily. Swallow tablets whole. Do not crush, break, cut or chew tablets. 30 tablet 5    predniSONE (DELTASONE) 50 MG tablet Take 4 tablets (200 mg total) by mouth once daily on days 1-5 of each cycle. 20 tablet 5    rosuvastatin (CRESTOR) 10 MG tablet Take 1 tablet (10 mg total) by mouth nightly. 90 tablet 3    valACYclovir (VALTREX) 500 MG tablet Take 1 tablet (500 mg total) by mouth daily. 30 tablet 12     No current facility-administered medications for this visit.        Changes to medications: Deyra reports no changes at this time.    Medication list has been reviewed and updated in Epic: Yes    Allergies   Allergen Reactions    Erythromycin Hives     Other reaction(s): Not available    Other      Pt cant take ibuprofen due to condition.    Sulfa (Sulfonamide Antibiotics) Anaphylaxis     Other reaction(s): Not available    Azithromycin Hives     Per patient report  Changes to allergies: No    Allergies have been reviewed and updated in Epic: Yes    SPECIALTY MEDICATION ADHERENCE     Iclusig 30 mg: 0 days of medicine on hand     Are there any concerns with adherence? No    Adherence counseling provided? Not needed    Patient-Reported Symptoms Tracker for Cancer Patients on Oral Chemotherapy     Oral chemotherapy medication name(s): Iclusig  Dose and frequency: 30 mg once daily  Oral Chemotherapy Start Date:    Baseline? No  Clinic(s) visited: Hematology  Were you able to reach the patient on a call today? Yes    Symptom Grouping Question Patient Response   Digestion and Eating Have you felt sick to your stomach? Did not assess    Had diarrhea? Did not assess    Constipated? Did not assess    Not wanting to eat? Did not assess    Comments      Sleep and Pain Felt very tired even after you rest? Did not assess    Pain due to cancer medication or cancer? Did not assess    Comments     Other Side Effects Numbness or tingling in hands and/or feet? Did not assess    Felt short of breath? Did not assess    Mouth or throat Sores? Did not assess    Rash? Did not assess    Palmar-plantar erythrodysesthesia syndrome?      Rash - acneiform?      Rash - maculo-papular?      How many days over the past month did your cancer medication or cancer keep you from your normal activities?  Write in number of days, 0-30:  0    Other side effects or things you would like to discuss?      Comments?     Adherence  In the last 30 days, on how many days did you miss at least one dose of any of your [drug name]? Write in number of days, 0-30:  1 - due to ran out of meds but dose increased required a new PA    What reasons are you having trouble taking your medication [pharmacist: check all that apply]? Specify chemotherapy cycle:        No problems identified    Comments:        Comments       Optional Symptom Tracking  New start - hematology ([facility]    Comments:      Specialty Pharmacist Interventions:     Question Patient Response    What actions did you take in response to the patient's PRO answers? No action required   Other (Specify):          CLINICAL MANAGEMENT AND INTERVENTION      Clinical Benefit Assessment:    Do you feel the medicine is effective or helping your condition? Yes    Clinical Benefit counseling provided? Not needed    Acute Infection Status:    Acute infections noted within Epic:  No active infections    Patient reported infection: None    Therapy Appropriateness:    Is therapy appropriate based on current medication list, adverse reactions, adherence, clinical benefit and progress toward achieving therapeutic goals?  Yes, therapy is appropriate and should be continued    DISEASE/MEDICATION-SPECIFIC INFORMATION      N/A    Is the patient receiving adequate infection prevention treatment? Not applicable  Does the patient have adequate nutritional support? Not applicable    PATIENT SPECIFIC NEEDS     Does the patient have any physical, cognitive, or cultural barriers? No    Is the patient high risk? No    Did the patient require a clinical intervention? No    Does the patient require physician intervention or other additional services (i.e., nutrition, smoking cessation, social work)? No    Does the patient have an additional or emergency contact listed in their chart? Yes    SOCIAL DETERMINANTS OF HEALTH     At the [facility], we have learned that life circumstances - like trouble affording food, housing, utilities, or transportation can affect the health of many of our patients.   That is why we wanted to ask: are you currently experiencing any life circumstances that are negatively impacting your health and/or quality of life? Patient declined to answer    Social Drivers of Health     Food Insecurity: No Food Insecurity (10/30/2023)    Received from [facility]    Hunger Vital Sign     Worried About Running Out of Food in the Last Year: Never true     Ran Out of Food in the Last Year: Never true   Tobacco Use: Medium Risk (11/04/2023)    Received from [facility]    Patient History     Smoking Tobacco Use: Former     Smokeless Tobacco Use: Never     Passive Exposure: Not on file   Transportation Needs: No Transportation Needs (10/30/2023)    Received from [facility] - Transportation     In the past 12 months, has lack of transportation kept you from medical appointments or from getting medications?: No     Lack of Transportation (Non-Medical): No   Alcohol Use: Unknown (09/10/2019)    Received from [facility] visits prior to 10/01/2022., [facility] visits prior to 10/01/2022.    AUDIT-C     Frequency of Alcohol Consumption: Monthly or less     Average Number of Drinks: Not asked     Frequency of Binge Drinking: Not asked   Housing: High Risk (10/30/2023)    Received from [facility]    Housing Stability Vital Sign     Unable to Pay for Housing in the Last Year: Yes     Number of Times Moved in the Last Year: 0     Homeless in the Last Year: No   Physical Activity: Not on file   Utilities: Not At Risk (10/30/2023)    Received from [facility] Utilities     Threatened with loss of utilities: No   Stress: Not on file   Interpersonal Safety: Not on file   Substance Use: Not on file (06/05/2023)   Intimate Partner Violence: Not on file   Social Connections: Not on file   Financial Resource Strain: Low Risk  (10/30/2023)    Received from [facility]    Overall Financial Resource Strain (CARDIA)     Difficulty of Paying Living Expenses: Not very hard   Depression: Not at risk (11/15/2022)    PHQ-2     PHQ-2 Score: 0   Internet Connectivity: Not on file   Health Literacy: Not on file       Would you be willing to receive help  with any of the needs that you have identified today? Not applicable       SHIPPING     Specialty Medication(s) to be Shipped:   Hematology/Oncology: Iclusig    Other medication(s) to be shipped: No additional medications requested for fill at this time     Changes to insurance: No    Delivery Scheduled: Yes, Expected medication delivery date: 11/09/23.     Medication will be delivered via Next Day Courier to the confirmed prescription address in [facility].    The patient will receive a drug information handout for each medication shipped and additional FDA Medication Guides as required.  Verified that patient has previously received a Conservation officer, historic buildings and a Surveyor, mining.    The patient or caregiver noted above participated in the development of this care plan and knows that they can request review of or adjustments to the care plan at any time.      All of the patient's questions and concerns have been addressed.    Kermit Balo, [facility]   [facility]  Pharmacist

## 2023-11-08 DIAGNOSIS — C9100 Acute lymphoblastic leukemia not having achieved remission: Principal | ICD-10-CM

## 2023-11-12 ENCOUNTER — Other Ambulatory Visit: Payer: Self-pay

## 2023-11-12 ENCOUNTER — Emergency Department

## 2023-11-12 ENCOUNTER — Emergency Department
Admission: EM | Admit: 2023-11-12 | Discharge: 2023-11-12 | Disposition: A | Discharge status: Home / Self Care | Attending: Emergency Medicine | Admitting: Emergency Medicine

## 2023-11-12 DIAGNOSIS — M542 Cervicalgia: Secondary | ICD-10-CM | POA: Diagnosis not present

## 2023-11-12 DIAGNOSIS — R519 Headache, unspecified: Secondary | ICD-10-CM | POA: Diagnosis present

## 2023-11-12 DIAGNOSIS — C9100 Acute lymphoblastic leukemia not having achieved remission: Principal | ICD-10-CM

## 2023-11-12 HISTORY — DX: Malignant (primary) neoplasm, unspecified: C80.1

## 2023-11-12 LAB — CBC WITH DIFFERENTIAL/PLATELET
Abs Immature Granulocytes: 0.05 10*3/uL (ref 0.00–0.07)
Basophils Absolute: 0.1 10*3/uL (ref 0.0–0.1)
Basophils Relative: 1 %
Eosinophils Absolute: 0.1 10*3/uL (ref 0.0–0.5)
Eosinophils Relative: 1 %
HCT: 37.7 % (ref 36.0–46.0)
Hemoglobin: 12.9 g/dL (ref 12.0–15.0)
Immature Granulocytes: 1 %
Lymphocytes Relative: 19 %
Lymphs Abs: 1.5 10*3/uL (ref 0.7–4.0)
MCH: 32.5 pg (ref 26.0–34.0)
MCHC: 34.2 g/dL (ref 30.0–36.0)
MCV: 95 fL (ref 80.0–100.0)
Monocytes Absolute: 0.7 10*3/uL (ref 0.1–1.0)
Monocytes Relative: 8 %
Neutro Abs: 5.7 10*3/uL (ref 1.7–7.7)
Neutrophils Relative %: 70 %
Platelets: 167 10*3/uL (ref 150–400)
RBC: 3.97 MIL/uL (ref 3.87–5.11)
RDW: 16.1 % — ABNORMAL HIGH (ref 11.5–15.5)
WBC: 8.1 10*3/uL (ref 4.0–10.5)
nRBC: 0 % (ref 0.0–0.2)

## 2023-11-12 LAB — BASIC METABOLIC PANEL WITH GFR
Anion gap: 8 (ref 5–15)
BUN: 33 mg/dL — ABNORMAL HIGH (ref 6–20)
CO2: 23 mmol/L (ref 22–32)
Calcium: 9.6 mg/dL (ref 8.9–10.3)
Chloride: 104 mmol/L (ref 98–111)
Creatinine, Ser: 0.67 mg/dL (ref 0.44–1.00)
GFR, Estimated: >60 mL/min (ref >60)
Glucose, Bld: 198 mg/dL — ABNORMAL HIGH (ref 70–99)
Potassium: 4.2 mmol/L (ref 3.5–5.1)
Sodium: 135 mmol/L (ref 135–145)

## 2023-11-12 MED ORDER — BUPIVACAINE HCL (PF) 0.5 % IJ SOLN
30.0000 mL | Freq: Once | INTRAMUSCULAR | Status: AC
  Administered 2023-11-12: 30 mL
  Filled 2023-11-12: qty 30

## 2023-11-12 NOTE — ED Provider Notes (Signed)
 [facility] Provider Note    Event Date/Time   First MD Initiated Contact with Patient 11/12/23 1554     (approximate)   History   Headache   HPI  Kathryn Black is a 48 y.o. female   who presents to the emergency department today because of concerns for continued neck and head pain.  The patient states that her symptoms started last month when she had a bone marrow biopsy.  She feels like they hit a nerve at that time.  The pain has been severe.  It starts at her upper neck and then goes across her scalp.  It is worse when she moves her head.  In addition the patient states that she has been having trouble controlling her blood sugar levels.  The patient has seen an orthopedic doctor for the neck and head pain, was put on muscle relaxer without any significant relief. Additionally she states she has seen endocrinology about her blood sugar levels.    Physical Exam   Triage Vital Signs: ED Triage Vitals  Encounter Vitals Group     BP 11/12/23 1431 (!) 135/95     Systolic BP Percentile --      Diastolic BP Percentile --      Pulse Rate 11/12/23 1430 87     Resp 11/12/23 1430 18     Temp 11/12/23 1430 98 F (36.7 C)     Temp src --      SpO2 11/12/23 1430 95 %     Weight --      Height --      Head Circumference --      Peak Flow --      Pain Score 11/12/23 1428 10     Pain Loc --      Pain Education --      Exclude from Growth Chart --     Most recent vital signs: Vitals:   11/12/23 1430 11/12/23 1431  BP:  (!) 135/95  Pulse: 87   Resp: 18   Temp: 98 F (36.7 C)   SpO2: 95%    General: Awake, alert, oriented. CV:  Good peripheral perfusion. Regular rate and rhythm Resp:  Normal effort. Lungs clear. Abd:  No distention.  Other:  Tender to palpation in the occipital region   ED Results / Procedures / Treatments   Labs (all labs ordered are listed, but only abnormal results are displayed) Labs Reviewed  CBC WITH DIFFERENTIAL/PLATELET  - Abnormal; Notable for the following components:      Result Value   RDW 16.1 (*)    All other components within normal limits  BASIC METABOLIC PANEL WITH GFR - Abnormal; Notable for the following components:   Glucose, Bld 198 (*)    BUN 33 (*)    All other components within normal limits     EKG  None   RADIOLOGY I independently interpreted and visualized the CT head/cervical spine. My interpretation: No ICH. No fracture Radiology interpretation:  IMPRESSION:  1. No acute intracranial process.  2. No acute fracture or traumatic subluxation of the cervical spine.  3. Degenerative disc disease at C5-C6.      PROCEDURES:  Critical Care performed: No  Procedures  NERVE BLOCK Performed by: Marylynn Soho Consent: Verbal consent obtained. Required items: required blood products, implants, devices, and special equipment available Time out: Immediately prior to procedure a "time out" was called to verify the correct patient, procedure, equipment, support staff and site/side  marked as required.  Indication: Neck/head pain Nerve block body site: occipital nerve block  Preparation: Patient was prepped in the usual sterile fashion. Needle gauge: 23 G Location technique: anatomical landmarks  Local anesthetic: % bupivacaine  Anesthetic total: 4 ml  Outcome: no significant change in pain level Patient tolerance: Patient tolerated the procedure well with no immediate complications.    MEDICATIONS ORDERED IN ED: Medications - No data to display   IMPRESSION / MDM / ASSESSMENT AND PLAN / ED COURSE  I reviewed the triage vital signs and the nursing notes.                              Differential diagnosis includes, but is not limited to, ICH, mass, migraine, DKA  Patient's presentation is most consistent with acute presentation with potential threat to life or bodily function.   Patient presented to the emergency department today because of concerns for  continued severe neck and head pain.  Also complaints of difficulty managing blood sugar levels.  CT head and neck ordered from triage without any acute abnormalities.  Does show findings concerning for some degenerative disc disease.  Blood work does show elevated blood sugar but no findings concerning for DKA.  I discussed with patient CT scan results and blood work results.  We did try an occipital nerve block given the location of the pain however the patient did not have any significant relief.  She does state that she was given a prescription for gabapentin although has not started it yet.  I do think this would be reasonable Of medication.  Did encourage patient to continue follow-up with outpatient providers.     FINAL CLINICAL IMPRESSION(S) / ED DIAGNOSES   Final diagnoses:  Neck pain  Bad headache     Note:  This document was prepared using Dragon voice recognition software and may include unintentional dictation errors.    Marylynn Soho, MD 11/12/23 [phone]

## 2023-11-12 NOTE — ED Notes (Signed)
 CT called to room pt to ED 14. Family taken to room at this time

## 2023-11-12 NOTE — ED Triage Notes (Addendum)
 Pt comes with c/o head pain that radiates at back of neck and spider webs up. Pt states this all started after having bone marrow biopsy. Pt states that was March 18th. Pt states since this has been going on. Pt states she has been to doctors with no resolution. Pt states the pain is horrible. Pt states she feels like she might black out twice already.   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

## 2023-11-15 DIAGNOSIS — C9100 Acute lymphoblastic leukemia not having achieved remission: Principal | ICD-10-CM

## 2023-11-15 NOTE — Unmapped (Signed)
 [facility] Note:    Discontinued the expired transfusion plan on 4/202/2023.          Antwane Grose, ANP  [facility]ACT[facility] PROVIDER FOR INFUSION PROGRAM

## 2023-11-16 DIAGNOSIS — C9100 Acute lymphoblastic leukemia not having achieved remission: Principal | ICD-10-CM

## 2023-11-16 NOTE — Unmapped (Signed)
 I spoke to Patricia [name] to discuss some of her symptoms. In the past few days, she has had headaches, which comes in waves but this has been going on almost since mid March; She says the headaches are very severe (10/10) when they happen. Her pain starts in her neck and spider webs up to the front of her head. Also feeling dizzy most days at this point starting mid March, and yesterday was the first time she felt like she was going to pass out. This also correlates with timing of high blood sugars - confirms nothing below 140 but we discussed this is all unlikely to be directly related to her blood sugars. Highest blood sugar has been in 300s. Does feel like things are progressively getting worse overall. She wen to local ED on 4/13 - labs stable, they completed CT without any findings. Also feels like the headaches are positional. Does have physiologist appt on 4/21 and endocrine appt on 4/21. She's been holding some of her meds which she is wondering may have contributed to insulin resistance issues though I encouraged her to resume all chronic meds.     Discussed with our APP and we will order possible MRI and/or LP for further evaluation of symptoms and she's aware to go to the [facility] ED if symptoms persist in the meantime.     Glynis Lass, PharmD, BCOP, CPP  Hematology/Oncology Clinical Pharmacist  Pager [phone]

## 2023-11-16 NOTE — Unmapped (Signed)
 Called patient to follow up on message she sent about her complaints of dizziness and reported fall yesterday. She is frustrated about the continuation of her symptoms (pain, dizziness) and the management of her hyperglycemia. Pain is unchanged, describes it as excruciating, in the occipital nerve area spiderwebbing to the front. She thinks it may be triggered by some pain in her lower back or from muscle compensation on her right side. She went to the ED at [facility] Sunday where they gave her lidocaine injections in the back of her skull that she says did nothing at all for the pain.  She saw ortho who did an exray that only showed her degenerative disk disease at C5-6.     She has not called endocrine yet about her hyperglycemia management concerns, but is planning to tomorrow after she has gathered some data. She tool herself off Metformin on 4/5 because she thought it was making her nerve issue worse. She started to feel better although dizziness continues. (Of note, she also started insulin on 4/2). 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 It gets as hight as 300, comes down to 200-250 range. She was taking 10 units of her long-acting insulin at night and her short-acting insulin before meals. Since she thinks the insulin isn't working and she had such terrible dizziness (and fall), she decided to stop taking it. Last dose of long-acting was 9 pm Tuesday night and last short-acting was 6 am on Wednesday. Since then, she says she has been monitoring her BG. Had a protein shake for breakfast, BG went to 250 after then went down to 200 an hour later without insulin. Ate a sandwich at 12:15 and BG currently 220. She says she feels great today, so plans to stay off insulin and gather data to present to endocrinologist. She has an appointment on Monday, but I told her she needed to get in touch with them by tomorrow and to very closely monitor her BG and for symptoms of hyperglycemia is she is staying off her insulin.    Additionally, she took herself off of her blood pressure medications 2-3 days ago because she thought they interacted with her BG and her other meds.     Message sent to team for insight/ suggestions.

## 2023-11-16 NOTE — Unmapped (Signed)
 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 She reports worsening headaches and posterior neck pain over the past month. Occasional dizziness and falls. Symptoms are intermittent but headaches/pain have been daily. CT imaging (at [facility]) and blood workup negative to date.    Recommend ASAP MRI Brain and LP with IT chemo to evaluate for potential extramedullary progression of the leukemia. Will also obtain viral testing on CSF given immunosuppression.    Abelina Hoes, AGPCNP-BC  Nurse Practitioner  Hematology/Oncology  [facility]

## 2023-11-17 DIAGNOSIS — C9100 Acute lymphoblastic leukemia not having achieved remission: Principal | ICD-10-CM

## 2023-11-20 DIAGNOSIS — C9100 Acute lymphoblastic leukemia not having achieved remission: Principal | ICD-10-CM

## 2023-11-20 NOTE — Unmapped (Signed)
 Spoke with patient. She saw the physiologist at [facility] this morning. X-rays were done of her neck and lumbar spine. She said the x-ray showed what looks like steps in that area. The physiologist also thought she may have narrowing of the spinal column. She prescribed 300 mg gabapentin to start tonight to see if that would help in case pain is nerve pain. Patient asks that entire spine be added to MRI on Friday instead of just cervical spine per physiologist's recommendations.     She is also meeting with her endocrinologist today to discuss her blood sugars and insulin. She hasn't had a dose of insulin since 4/16 am. Her BG has been as high as 300's, but comes down without insulin eventually, maybe 4 hours. Lowest has been 140. She thought about rescheduling her endo appointment, but I advised she needed to keep it to fully discuss these issues.     Message sent to team.

## 2023-11-21 ENCOUNTER — Ambulatory Visit: Admit: 2023-11-21 | Discharge: 2023-11-21 | Payer: PRIVATE HEALTH INSURANCE

## 2023-11-21 ENCOUNTER — Inpatient Hospital Stay: Admit: 2023-11-21 | Discharge: 2023-11-21 | Payer: PRIVATE HEALTH INSURANCE

## 2023-11-21 ENCOUNTER — Ambulatory Visit
Admit: 2023-11-21 | Discharge: 2023-11-21 | Payer: PRIVATE HEALTH INSURANCE | Attending: Adult Health | Primary: Adult Health

## 2023-11-21 ENCOUNTER — Other Ambulatory Visit: Admit: 2023-11-21 | Discharge: 2023-11-21 | Payer: PRIVATE HEALTH INSURANCE

## 2023-11-21 DIAGNOSIS — C9100 Acute lymphoblastic leukemia not having achieved remission: Principal | ICD-10-CM

## 2023-11-21 DIAGNOSIS — B259 Cytomegaloviral disease, unspecified: Principal | ICD-10-CM

## 2023-11-21 DIAGNOSIS — C9101 Acute lymphoblastic leukemia, in remission: Principal | ICD-10-CM

## 2023-11-21 DIAGNOSIS — M4807 Spinal stenosis, lumbosacral region: Principal | ICD-10-CM

## 2023-11-21 LAB — COMPREHENSIVE METABOLIC PANEL
ALBUMIN: 4.1 g/dL (ref 3.4–5.0)
ALKALINE PHOSPHATASE: 283 U/L — ABNORMAL HIGH (ref 46–116)
ALT (SGPT): 1663 U/L — ABNORMAL HIGH (ref 10–49)
ANION GAP: 11 mmol/L (ref 5–14)
AST (SGOT): 752 U/L — ABNORMAL HIGH (ref <=34)
BILIRUBIN TOTAL: 0.5 mg/dL (ref 0.3–1.2)
BLOOD UREA NITROGEN: 26 mg/dL — ABNORMAL HIGH (ref 9–23)
BUN / CREAT RATIO: 46
CALCIUM: 10.1 mg/dL (ref 8.7–10.4)
CHLORIDE: 100 mmol/L (ref 98–107)
CO2: 26 mmol/L (ref 20.0–31.0)
CREATININE: 0.56 mg/dL (ref 0.55–1.02)
EGFR CKD-EPI (2021) FEMALE: >90 mL/min/1.73m2 (ref >=60)
GLUCOSE RANDOM: 370 mg/dL — ABNORMAL HIGH (ref 70–179)
POTASSIUM: 4.5 mmol/L (ref 3.4–4.8)
PROTEIN TOTAL: 7.3 g/dL (ref 5.7–8.2)
SODIUM: 137 mmol/L (ref 135–145)

## 2023-11-21 LAB — CBC W/ AUTO DIFF
BASOPHILS ABSOLUTE COUNT: 0 10*9/L (ref 0.0–0.1)
BASOPHILS RELATIVE PERCENT: 0.7 %
EOSINOPHILS ABSOLUTE COUNT: 0.1 10*9/L (ref 0.0–0.5)
EOSINOPHILS RELATIVE PERCENT: 2 %
HEMATOCRIT: 36.4 % (ref 34.0–44.0)
HEMOGLOBIN: 12.3 g/dL (ref 11.3–14.9)
LYMPHOCYTES ABSOLUTE COUNT: 1.2 10*9/L (ref 1.1–3.6)
LYMPHOCYTES RELATIVE PERCENT: 21.7 %
MEAN CORPUSCULAR HEMOGLOBIN CONC: 33.8 g/dL (ref 32.0–36.0)
MEAN CORPUSCULAR HEMOGLOBIN: 32.3 pg (ref 25.9–32.4)
MEAN CORPUSCULAR VOLUME: 95.6 fL (ref 77.6–95.7)
MEAN PLATELET VOLUME: 9.1 fL (ref 6.8–10.7)
MONOCYTES ABSOLUTE COUNT: 0.3 10*9/L (ref 0.3–0.8)
MONOCYTES RELATIVE PERCENT: 5.7 %
NEUTROPHILS ABSOLUTE COUNT: 3.8 10*9/L (ref 1.8–7.8)
NEUTROPHILS RELATIVE PERCENT: 69.9 %
PLATELET COUNT: 167 10*9/L (ref 150–450)
RED BLOOD CELL COUNT: 3.81 10*12/L — ABNORMAL LOW (ref 3.95–5.13)
RED CELL DISTRIBUTION WIDTH: 17.7 % — ABNORMAL HIGH (ref 12.2–15.2)
WBC ADJUSTED: 5.5 10*9/L (ref 3.6–11.2)

## 2023-11-21 LAB — LIPID PANEL
CHOLESTEROL: 230 mg/dL — ABNORMAL HIGH (ref <200)
HDL CHOLESTEROL: 39 mg/dL — ABNORMAL LOW (ref >50)
LDL CHOLESTEROL CALCULATED: 129 mg/dL — ABNORMAL HIGH (ref <100)
NON-HDL CHOLESTEROL: 191 mg/dL — ABNORMAL HIGH (ref <130)
TRIGLYCERIDES: 361 mg/dL — ABNORMAL HIGH (ref <150)

## 2023-11-21 LAB — LIPASE: LIPASE: 89 U/L — ABNORMAL HIGH (ref 12–53)

## 2023-11-21 LAB — APTT
APTT: 34.4 s (ref 24.8–38.4)
HEPARIN CORRELATION: 0.2

## 2023-11-21 LAB — ACETAMINOPHEN LEVEL: ACETAMINOPHEN LEVEL: <2 ug/mL (ref <=20.0)

## 2023-11-21 LAB — PROTIME-INR
INR: 1.04
PROTIME: 11.8 s (ref 9.9–12.6)

## 2023-11-21 LAB — CMV DNA, QUANTITATIVE, PCR: CMV VIRAL LD: NOT DETECTED

## 2023-11-21 LAB — AMYLASE: AMYLASE: 99 U/L (ref 30–118)

## 2023-11-21 MED ORDER — PREDNISONE 50 MG TABLET
ORAL_TABLET | 5 refills | 0.00 days | Status: CP
Start: 2023-11-21 — End: ?

## 2023-11-21 MED ORDER — LETERMOVIR 480 MG TABLET
ORAL_TABLET | Freq: Every day | ORAL | 5 refills | 28.00 days | Status: CP
Start: 2023-11-21 — End: ?
  Filled 2023-11-21: qty 28, 28d supply, fill #0

## 2023-11-21 NOTE — Unmapped (Signed)
 [facility] Follow Up Visit Note     [name]  Patient Age: 48 y.o.  Encounter Date: 11/21/2023    Primary Care Provider:  Tanya Fantasia, MD    Referring Physician:  Virlinda Grimmer, AGNP  [address]  [id number]  [place],  [place] [address]    Cancer Diagnosis: Ph+ B-ALL; Initial Dx 10/29/2021  Cancer status: CR1, BCR/ABL 47/100,000 10/16/23  Treatment Regimen: First Line, ponatinib  + Maintenance (vincristine , prednisone ) s/p truncated course of GRAAPH-2005, HyperCVAD  Treatment Goal: Curative  Comorbidities: HTN, anxiety, hemorrhoids with anal fissures  Transplant: referral deferred in CR1    Assessment/Plan:  Patricia [name] is a 48 y.o. female with past medical history of chronic right shoulder and back pain, hypertension, anxiety, and recently diagnosed Ph+ ALL. 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  Given her deep molecular remission and her intolerance of hyperCVAD, we are transitioned to maintenance chemotherapy.     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 Rising BCR/ABL P190 levels prompted dose increase. Concern for CNS involvement due to symptoms. Maintenance therapy on hold due to transaminitis.  - Continue ponatinib  30 mg daily with 81 mg aspirin daily  - Hold Cycle 20 IV vincristine  and oral prednisone  due to transaminitis.  - Perform MRI of brain and cervical spine on 11/24/2023.  - Plan lumbar puncture with intrathecal chemotherapy ~one week after last Excedrin dose (4/22).    Transaminitis  Elevated liver enzymes likely due to Excedrin. Differential includes CMV reactivation. Alcohol unlikely a factor.  - Hold IV vincristine  and oral prednisone .  - Order Tylenol  level, CMV level, and coagulation panel.  - Advise against use of Excedrin and Tylenol -containing products.  - Advise against alcohol consumption.  - Repeat liver function tests on 11/24/2023.    Neck pain with headaches radiating forward (primarily right-side), 2 falls, intermittent dizziness   Headaches and neck pain possibly related to cervical spine issues, however given reported intermittent weakness, dizziness, and falls, concern for CNS/extramedullary progression of the leukemia. Glucose levels less likely the cause - no hypoglycemic episodes. Pain helped by Excedrin but she was taking high doses and LFTs are now elevated. Gabapentin recently effective.  - Prescribe gabapentin 300 mg three times daily.  - Advise use of non-pharmacological measures such as cervical pillow and lumbar support pillow.  - MRI of brain and cervical spine on 11/24/2023 scheduled. Physiologist at [facility] recommends including thoracic/lumbar spine, so additional orders placed.  - postpone LP due to high level of aspirin intake (within Excedrin) - 5+ days postponement      Pancytopenia due to chemotherapy  - ANC and Platelets sufficient to proceed without changes 11/21/2023   - supportive care as detailed below when in nadir    HTN: ponatinib  likely contributes  - Losartan  - start 02/08/23  - monitor BP trend at home    Hx CMV viremia: Dr. Janalee Mcmurray, ICID is following. 09/2022 restarted Valcyte  with the goal of controlling CMV and limiting neutropenia  - continue letermovir   - d/c CMV levels as of 03/2023 - per Dr. Janalee Mcmurray  - 09/19/23 - CMV negative - checked due to elevated temps    Hypogammaglobulinemia:   - Monthly IVIG started 07/26/22 - now at [facility] due to insurance coverage    Left  knee pain  - 08/22/23 - will refer to orthopedics is pain persists/worsens    Hemorrhoids, Anal fissure: persistent but stable, per patient  - supportive care at home    Menses suppression and birth control: previously received Lupron  with last dose 07/26/22   - patient stopped oral contraceptive due to weight loss. Counseled on importance of birth control on TKI. She is abstinent and making an appointment with GYN       Psychosocial distress: She reports a moderate level of anxiety regarding the management of the above.   - Counseling given   - Consider ref to comprehensive cancer support program     Patient-centered care/Shared decision-making:   We discussed the plan above at length. The patient and her husband actively contributed to the conversation. Specifically, her most important outcomes are:   Prolonging overall survival and Maintaining her overall functional activity     Supportive Care Needs: We recommend based on the patient???s underlying diagnosis and treatment history the following supportive care:    1. Antimicrobial prophylaxis:    Viral - valacyclovir  and letermovir   Bacterial: Levofloxacin  when ANC <0.5  Fungal: Fluconazole  200mg  when ANC <0.5  PJP: dapsone  100mg  daily    2. Blood product support:  Leukoreduced blood products are required.  Irradiated blood products are preferred, but in case of urgent transfusion needs non-irradiated blood products may be used: 2 units for Hg <=8.0.     Coordination of care:   - HOLD this cycle of maintenance  - RTC in 2 weeks  - reschedule LP      Abelina Hoes, AGPCNP-BC  Nurse Practitioner  Hematology/Oncology  [facility]    Wayland Haggis, MD  Leukemia Program  Division of Hematology  [facility]    Nurse Navigator (non-clinical trial patients): Madelon Scheuermann, RN        Tel. [phone]       Fax. [phone]  Toll-free appointments: [phone]  Scheduling assistance: [phone]  After hours/weekends: [phone] (ask for adult hematology/oncology on-call)      History of Present Illness:   Patricia [name] is a 48 y.o. female with past medical history noted as above who presents for follow up Ph+ ALL.      Her social history is notable for being a Runner, broadcasting/film/video.  She was recently married. She lives with her husband.    Interim history    History of Present Illness      The patient, with a history of B cell Acute Lymphoblastic Leukemia (ALL) treated with GRAAPH 2005 plus Dasatinib  and currently on maintenance therapy with Ponatinib , Vincristine , and Prednisone , presents with headaches and neck pain. The patient reports using Excedrin Migraine at high doses, at least eight tablets a day, sometimes two tablets every three hours, to manage the pain. The patient also reports recent falls at home and has been seen by a physiologist at [facility]. The patient is due for the next cycle of maintenance therapy, which would be cycle 20. However, the patient's liver enzymes have significantly increased, possibly due to the high intake of Excedrin Migraine. The patient also has diabetes and is working with endocrinology.        Oncology History is as below:   Hematology/Oncology History Overview Note   Referring/Local Oncologist:    Diagnosis:   10/29/2021  Bone marrow, left iliac, aspiration and biopsy  -  Hypercellular bone marrow (greater than 90%) involved by B lymphoblastic leukemia (~95%blasts by morphologic assessment of aspirate smears and touch  preps)  - Abnormal Karyotype:  47,XX,t(9;22)(q34;q11.2),+der(22)t(9;22)[18]/46,XX[2]     Abnormal FISH:  A BCR/ABL1 interphase FISH assay showed a signal pattern consistent with a BCR::ABL1 rearrangement and the 9;22 translocation in 88% of the 100 cells scored. The majority of the abnormal cells (64/88) contained an additional BCR/ABL1 fusion signal, while 9/88 abnormal cells contained an additional ABL1 and ASS1 signal.       Genetics:   Karyotype/FISH:   RESULTS   Date Value Ref Range Status   10/28/2021   Final    NOTE: This report reflects a combined study from a peripheral blood and a bone marrow core biopsy. Eleven cells from the peripheral blood and nine cells from the bone marrow core biopsy were analyzed. The BCR/ABL1 FISH analysis was performed on the peripheral blood.     Abnormal Karyotype: 47,XX,t(9;22)(q34;q11.2),+der(22)t(9;22)[18]/46,XX[2]    Abnormal FISH:  A BCR/ABL1 interphase FISH assay showed a signal pattern consistent with a BCR::ABL1 rearrangement and the 9;22 translocation in 88% of the 100 cells scored. The majority of the abnormal cells (64/88) contained an additional BCR/ABL1 fusion signal, while 9/88 abnormal cells contained an additional ABL1 and ASS1 signal.           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  MRD by flow insufficient, p190 2/100,000  12/10/2021: Cycle 2 B cycle + rituximab   12/14/21: IT#4  12/17/21: IT#5  01/10/22: Cycle 3 A cycle + rituximab   01/11/22: IT #6  01/24/22: Bmbx - MRD-neg by PCR (p190)   02/17/22: C4 B cycle + ritux  02/18/22: IT#7  03/21/22: C5 A cycle + ritux (4th dose)  03/22/22: IT#8  04/29/22: Bmbx - MRD-neg by PCR (p190)   05/18/22: restart dasatinib  70mg  s/p neutropenia  05/26/22: Start maintenance vincristine  2mg , Prednisone  200mg  D1-5   06/02/22: IT #9  06/27/22: C2 Maintenance: vincristine  2mg , Prednisone  200mg  D1-5, HOLD dasatinib  due to neutropenia  07/04/22: RESTART dasatinib  70mg   07/21/22: IT #10  07/26/22: C3 Maintenance: vincristine  2mg , Prednisone  200mg  D1-5, dasatinib  70 mg  08/08/22: IT #11 - prophylaxis completed  08/23/22: C4 Maintenance: vincristine  2mg , Prednisone  200mg  D1-5, dasatinib  70 mg  09/20/22: C5 Maintenance: vincristine  2mg , Prednisone  200mg  D1-5, dasatinib  70 mg  10/04/22: Bone marrow biopsy - continued remission, MRD and BCR/ABL p190 negative  10/18/22: C6 Maintenance. ANC 0.4, HOLD dasatinib , continue vincristine  2mg  and Prednisone  200mg  D1-5  11/15/22: C7 Maintenance ANC 0.4. HOLD TKI, continue vincristine  2mg  and Prednisone  200mg  D1-5  ~11/30/22: Start ponatinib  15 mg daily  12/13/22: C8 Maintenance ANC 1.2. ponatinib  15mg , vincristine  2mg  D1 and Prednisone  200mg  D1-5  01/10/23: C9 Maintenance Anc 0.7. HOLD ponatinib , continue vincristine  2mg  D1 and Prednisone  200mg  D1-5  01/26/23: ANC 1.7, restart ponatinib  15 mg  02/08/23: C10 Maintenance. ANC is 1.4 - continue ponatinib   03/07/23: C11 Maintenance ANC is 1.1. continue ponatinib   04/06/23: C12 Maintenance  04/12/23: Bmbx - continued remission, MRD-negative, BCR/ABL undetectable  05/02/23: C13 Maintenance  05/30/23: C14 Maintenance  06/27/23: C15 Maintenance PB p210 2/100,000  07/23/23: C16 Maintenance PB p210 2/100,000  08/22/23: C17 Maintenance PB p210 4/100,000  09/19/23: C18 Maintenance PB p210 2/100,000  10/16/23: C19 Maintenance - HOLD prednisone  due to hyperglycemia  10/16/23:  Bmbx - CR, MRD-neg by flow, p210 47/100,000   11/01/23: INCREASE ponatinib  to 30 mg daily         Review of Systems:   ROS reviewed and negative except as noted in H and P     Allergies:  Allergies   Allergen Reactions    Erythromycin Hives     Other reaction(s): Not available    Other      Pt cant take ibuprofen due to condition.    Sulfa (Sulfonamide Antibiotics) Anaphylaxis     Other reaction(s): Not available    Azithromycin Hives     Per patient report       Medications:     Current Outpatient Medications:     acetaminophen  (TYLENOL  8 HOUR) 650 MG CR tablet, Take 2 tablets (1,300 mg total) by mouth every eight (8) hours as needed for pain., Disp: , Rfl:     albuterol  HFA 90 mcg/actuation inhaler, Inhale 2 puffs every six (6) hours as needed for wheezing., Disp: 8 g, Rfl: 1    aspirin (ECOTRIN) 81 MG tablet, Take 1 tablet (81 mg total) by mouth daily., Disp: , Rfl:     blood sugar diagnostic (GLUCOSE BLOOD) Strp, Use to check blood sugar two times a day., Disp: 100 each, Rfl: 0    blood-glucose meter kit, Use to check blood sugar two times a day. Use as instructed., Disp: 1 each, Rfl: 0    cetirizine  (ZYRTEC ) 10 MG tablet, Take 1 tablet (10 mg total) by mouth nightly., Disp: 90 tablet, Rfl: 3    dapsone  100 MG tablet, Take 1 tablet (100 mg total) by mouth daily. TAKE 1 TABLET(100 MG) BY MOUTH DAILY, Disp: 30 tablet, Rfl: 11    escitalopram  oxalate (LEXAPRO ) 10 MG tablet, Take 1 tablet (10 mg total) by mouth daily., Disp: 90 tablet, Rfl: 2    gabapentin (NEURONTIN) 300 MG capsule, Take 1 capsule (300 mg total) by mouth., Disp: , Rfl:     lancets Misc, Use to check blood sugar as directed 2 times a day & for symptoms of high or low blood sugar., Disp: 100 each, Rfl: 0    levalbuterol  (XOPENEX ) 0.31 mg/3 mL nebulizer solution, Inhale 3 mL (0.31 mg total) by nebulization every four (4) hours as needed for wheezing (cough)., Disp: 45 mL, Rfl: 0    losartan  (COZAAR ) 50 MG tablet, Take 1 tablet (50 mg total) by mouth daily., Disp: 30 tablet, Rfl: 3    PONATinib  (ICLUSIG ) 30 mg tablet, Take 1 tablet (30 mg total) by mouth daily. Swallow tablets whole. Do not crush, break, cut or chew tablets., Disp: 30 tablet, Rfl: 5    rosuvastatin  (CRESTOR ) 10 MG tablet, Take 1 tablet (10 mg total) by mouth nightly., Disp: 90 tablet, Rfl: 3    valACYclovir  (VALTREX ) 500 MG tablet, Take 1 tablet (500 mg total) by mouth daily., Disp: 30 tablet, Rfl: 12    letermovir  (PREVYMIS ) 480 mg tablet, Take 1 tablet (480 mg total) by mouth daily., Disp: 28 tablet, Rfl: 5    predniSONE  (DELTASONE ) 50 MG tablet, Take 4 tablets (200 mg total) by mouth once daily on days 1-5 of each cycle., Disp: 20 tablet, Rfl: 5    Medical History:  Past Medical History:   Diagnosis Date    B-cell acute lymphoblastic leukemia (ALL) 10/29/2021    EBV infection        Social History:  Social History     Social History Narrative    Not  on file       Family History:  No family history on file.    Objective:   BP 135/95  - Pulse 84  - Temp 36.7 ??C (98 ??F) (Oral)  - Wt 92.4 kg (203 lb 11.3 oz)  - SpO2 96%  - BMI 31.97 kg/m??     Physical Exam:  General: Resting in no apparent distress, accompanied by husband  HEENT: Clear sclera, conjunctiva  CARDAC: RRR, no R,M,Gs, no pitting peripheral edema  RESP: nonlabored, bilaterally CTA  GI: Soft, nontender, active bowel sounds, no hepatic or splenomegaly  NEURO: alert, 0x4, steady gait, no focal deficits  PSYCH: appropriate  DERM: no visible rashes, lesions  LINE: port        Test Results:    Results      LABS  BCR/ABL P190: undetectable (Summer 2024)  BCR/ABL P190: 47/100,000 cells (11/01/2023)  AST: 27 (10/17/2023)  AST: 752 (11/19/2023)  ALT: 56 (10/17/2023)  ALT: 1663 (11/19/2023)  Alkaline phosphatase: 283 (11/19/2023)    PATHOLOGY  Bone marrow biopsy: not specified (10/17/2023)

## 2023-11-22 DIAGNOSIS — C9100 Acute lymphoblastic leukemia not having achieved remission: Principal | ICD-10-CM

## 2023-11-22 NOTE — Unmapped (Signed)
 Addended by: Abelina Hoes T on: 11/22/2023 09:56 PM     Modules accepted: Orders

## 2023-11-23 DIAGNOSIS — C9100 Acute lymphoblastic leukemia not having achieved remission: Principal | ICD-10-CM

## 2023-11-23 DIAGNOSIS — M549 Dorsalgia, unspecified: Principal | ICD-10-CM

## 2023-11-23 DIAGNOSIS — R519 Acute nonintractable headache, unspecified headache type: Principal | ICD-10-CM

## 2023-11-23 DIAGNOSIS — M542 Cervicalgia: Principal | ICD-10-CM

## 2023-11-23 DIAGNOSIS — G8929 Other chronic pain: Principal | ICD-10-CM

## 2023-11-24 ENCOUNTER — Ambulatory Visit: Admit: 2023-11-24 | Payer: PRIVATE HEALTH INSURANCE

## 2023-11-24 ENCOUNTER — Inpatient Hospital Stay
Admit: 2023-11-24 | Discharge: 2023-12-15 | Disposition: A | Discharge status: Home / Self Care | Payer: PRIVATE HEALTH INSURANCE

## 2023-11-24 ENCOUNTER — Encounter: Admit: 2023-11-24 | Payer: PRIVATE HEALTH INSURANCE | Attending: Emergency Medicine

## 2023-11-24 LAB — URINALYSIS WITH MICROSCOPY WITH CULTURE REFLEX PERFORMABLE
BACTERIA: NONE SEEN /HPF
BILIRUBIN UA: NEGATIVE
BLOOD UA: NEGATIVE
GLUCOSE UA: 500 — AB
KETONES UA: NEGATIVE
LEUKOCYTE ESTERASE UA: NEGATIVE
NITRITE UA: NEGATIVE
PH UA: 6.5 (ref 5.0–9.0)
PROTEIN UA: 50 — AB
RBC UA: <1 /HPF (ref <=4)
SPECIFIC GRAVITY UA: 1.011 (ref 1.003–1.030)
SQUAMOUS EPITHELIAL: <1 /HPF (ref 0–5)
UROBILINOGEN UA: <2
WBC UA: 2 /HPF (ref 0–5)

## 2023-11-24 LAB — PROTIME-INR
INR: 1.04
PROTIME: 11.8 s (ref 9.9–12.6)

## 2023-11-24 LAB — COMPREHENSIVE METABOLIC PANEL
ALBUMIN: 4.1 g/dL (ref 3.4–5.0)
ALKALINE PHOSPHATASE: 296 U/L — ABNORMAL HIGH (ref 46–116)
ALT (SGPT): 1520 U/L — ABNORMAL HIGH (ref 10–49)
ANION GAP: 13 mmol/L (ref 5–14)
AST (SGOT): 184 U/L — ABNORMAL HIGH (ref <=34)
BILIRUBIN TOTAL: 0.6 mg/dL (ref 0.3–1.2)
BLOOD UREA NITROGEN: 17 mg/dL (ref 9–23)
BUN / CREAT RATIO: 37
CALCIUM: 10.3 mg/dL (ref 8.7–10.4)
CHLORIDE: 101 mmol/L (ref 98–107)
CO2: 25 mmol/L (ref 20.0–31.0)
CREATININE: 0.46 mg/dL — ABNORMAL LOW (ref 0.55–1.02)
EGFR CKD-EPI (2021) FEMALE: >90 mL/min/1.73m2 (ref >=60)
GLUCOSE RANDOM: 228 mg/dL — ABNORMAL HIGH (ref 70–179)
POTASSIUM: 3.9 mmol/L (ref 3.4–4.8)
PROTEIN TOTAL: 7.5 g/dL (ref 5.7–8.2)
SODIUM: 139 mmol/L (ref 135–145)

## 2023-11-24 LAB — CBC W/ AUTO DIFF
BASOPHILS ABSOLUTE COUNT: 0 10*9/L (ref 0.0–0.1)
BASOPHILS RELATIVE PERCENT: 0.4 %
EOSINOPHILS ABSOLUTE COUNT: 0.1 10*9/L (ref 0.0–0.5)
EOSINOPHILS RELATIVE PERCENT: 0.7 %
HEMATOCRIT: 37.8 % (ref 34.0–44.0)
HEMOGLOBIN: 13 g/dL (ref 11.3–14.9)
LYMPHOCYTES ABSOLUTE COUNT: 1.3 10*9/L (ref 1.1–3.6)
LYMPHOCYTES RELATIVE PERCENT: 14.1 %
MEAN CORPUSCULAR HEMOGLOBIN CONC: 34.2 g/dL (ref 32.0–36.0)
MEAN CORPUSCULAR HEMOGLOBIN: 32.1 pg (ref 25.9–32.4)
MEAN CORPUSCULAR VOLUME: 93.9 fL (ref 77.6–95.7)
MEAN PLATELET VOLUME: 8.7 fL (ref 6.8–10.7)
MONOCYTES ABSOLUTE COUNT: 0.6 10*9/L (ref 0.3–0.8)
MONOCYTES RELATIVE PERCENT: 7 %
NEUTROPHILS ABSOLUTE COUNT: 7.1 10*9/L (ref 1.8–7.8)
NEUTROPHILS RELATIVE PERCENT: 77.8 %
PLATELET COUNT: 157 10*9/L (ref 150–450)
RED BLOOD CELL COUNT: 4.03 10*12/L (ref 3.95–5.13)
RED CELL DISTRIBUTION WIDTH: 17.5 % — ABNORMAL HIGH (ref 12.2–15.2)
WBC ADJUSTED: 9.2 10*9/L (ref 3.6–11.2)

## 2023-11-24 LAB — APTT
APTT: 117.8 s — ABNORMAL HIGH (ref 24.8–38.4)
HEPARIN CORRELATION: 0.7

## 2023-11-24 LAB — ACETAMINOPHEN LEVEL: ACETAMINOPHEN LEVEL: <2 ug/mL (ref <=20.0)

## 2023-11-24 LAB — BILIRUBIN, DIRECT: BILIRUBIN DIRECT: 0.2 mg/dL (ref 0.00–0.30)

## 2023-11-24 LAB — HIGH SENSITIVITY TROPONIN I - SINGLE: HIGH SENSITIVITY TROPONIN I: 5 ng/L (ref <=34)

## 2023-11-24 LAB — SALICYLATE LEVEL: SALICYLATE LEVEL: <3 mg/dL (ref <=30.0)

## 2023-11-24 MED ADMIN — gadopiclenol (ELUCIREM,VUEWAY) injection 9.2 mL: 9.2 mL | INTRAVENOUS | @ 19:00:00 | Stop: 2023-11-24

## 2023-11-24 MED ADMIN — diphenhydrAMINE (BENADRYL) injection 25 mg: 25 mg | INTRAVENOUS | @ 18:00:00 | Stop: 2023-11-24

## 2023-11-24 MED ADMIN — sodium chloride 0.9% (NS) bolus 1,000 mL: 1000 mL | INTRAVENOUS | @ 14:00:00 | Stop: 2023-11-24

## 2023-11-24 MED ADMIN — diphenhydrAMINE (BENADRYL) injection 25 mg: 25 mg | INTRAVENOUS | @ 13:00:00 | Stop: 2023-11-24

## 2023-11-24 MED ADMIN — metoclopramide (REGLAN) injection 10 mg: 10 mg | INTRAVENOUS | @ 18:00:00 | Stop: 2023-11-24

## 2023-11-24 MED ADMIN — metoclopramide (REGLAN) injection 10 mg: 10 mg | INTRAVENOUS | @ 13:00:00 | Stop: 2023-11-24

## 2023-11-24 NOTE — Unmapped (Signed)
 Bed: 74-D  Expected date:   Expected time:   Means of arrival:   Comments:  Pt

## 2023-11-24 NOTE — Unmapped (Signed)
 [facility]  Emergency Department Provider Note     ED Clinical Impression     Final diagnoses:   Intractable headache, unspecified chronicity pattern, unspecified headache type (Primary)        Impression, Medical Decision Making, ED Course     8:30 AM   Impression: 48 y.o. 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 Upon arrival, VS significant only for BP 147/113.    DDx/MDM: Headache has been progressively worsening over the past month and is associated with visual disturbances, persistent nausea, and intermittent vomiting that is now occurring independently of food intake. Also reports new unsteadiness on her feet but denies focal neurological deficits such as weakness or numbness. Notably, liver enzymes were significantly elevated on 4/22 per outpatient oncology, potentially secondary to frequent Excedrin use (discontinued), which also led to holding of most recent vincristine /prednisone  cycle. Patient was initially pursuing outpatient workup, but given progression of symptoms and concern for possible CNS involvement, more urgent evaluation was recommended. No signs of infection reported. Given concerning symptomatology in the context of immunocompromised status and recent chemotherapy, broad differential includes CNS leukemic involvement, increased ICP, PRES, toxic/metabolic encephalopathy, or medication-related adverse effects. Will proceed with neuroimaging (MRI brain if available, otherwise CT), labs including CBC, CMP, acetaminophen  level, PTT, INR, and salicylate level.     Will treat patient with Benadryl , Reglan, and fluids initially.      ED Course as of 11/24/23 1443   Fri Nov 24, 2023   1144 hsTroponin I: 5   1144 Acetaminophen  Level: <2.0   1144 Salicylate Lvl: <3.0   1145 CBC and CMP unremarkable outside of elevated LFTs which are stable / slightly improved compared to 4/22   1443 Patient signed out to oncoming provider pending MRI results       ____________________________________________    The case was discussed with the attending physician, who is in agreement with the above assessment and plan.      History     Chief Complaint  Chief Complaint   Patient presents with    Headache Recurrent or Known Dx Migraines       HPI   Patricia [name] is a 48 y.o. female with past medical history as below who presents with headache and N/V.    Patient presents today at recommendation of oncology team due to persistent extreme headache and vomiting.  Headache started about 1 month ago.  Pain initially started at the base of her head, but often radiates throughout her entire head towards the front.  She does report some vision changes/decreased vision especially when she is having an episode of extreme pain.  She describes his vision changes as bright readings that she sees and/for squiggles in her vision.  She does occasionally have decreased/tunneled vision during these episodes.  Over the past week she has become increasingly more nauseous and had episodes of vomiting that she attributes to taking gabapentin.  However she has continued to have constant nausea with occasional vomiting unrelated to food intake.  Denies any new numbness or weakness, but does often feel unsteady on her feet which is also new over the past month.    Of note her outpatient oncologist did find that her liver enzymes were largely elevated on 4/22 compared to prior labs in the beginning of March.  She does report that she was taking Excedrin every 3-4 hours multiple days in a  row during this timeframe due to the head pain.  She has since stopped taking it.  Her most recent cycle of IV vincristine  and oral prednisone  (cycle 20) was held due to transaminitis.    She was initially scheduled for outpatient MRI, however given the progression of her symptoms her outpatient oncology team recommended she be evaluated more urgently.    Denies fevers, cough, chest pain, shortness of breath, diarrhea, urinary symptoms. Does have mild LLQ abdominal pain, that started this morning while dry heaving, feels more muscular than internal.    Outside Historian(s): I have obtained additional history/collateral from husband.    External Records Reviewed: I have reviewed outpatient oncology notes, other outpatient clinic notes, previous labs, and previous imaging.    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    Current Outpatient Medications:     acetaminophen  (TYLENOL  8 HOUR) 650 MG CR tablet, Take 2 tablets (1,300 mg total) by mouth every eight (8) hours as needed for pain., Disp: , Rfl:     albuterol  HFA 90 mcg/actuation inhaler, Inhale 2 puffs every six (6) hours as needed for wheezing., Disp: 8 g, Rfl: 1    aspirin (ECOTRIN) 81 MG tablet, Take 1 tablet (81 mg total) by mouth daily., Disp: , Rfl:     blood sugar diagnostic (GLUCOSE BLOOD) Strp, Use to check blood sugar two times a day., Disp: 100 each, Rfl: 0    blood-glucose meter kit, Use to check blood sugar two times a day. Use as instructed., Disp: 1 each, Rfl: 0    cetirizine  (ZYRTEC ) 10 MG tablet, Take 1 tablet (10 mg total) by mouth nightly., Disp: 90 tablet, Rfl: 3    dapsone  100 MG tablet, Take 1 tablet (100 mg total) by mouth daily. TAKE 1 TABLET(100 MG) BY MOUTH DAILY, Disp: 30 tablet, Rfl: 11    escitalopram  oxalate (LEXAPRO ) 10 MG tablet, Take 1 tablet (10 mg total) by mouth daily., Disp: 90 tablet, Rfl: 2    gabapentin (NEURONTIN) 300 MG capsule, Take 1 capsule (300 mg total) by mouth., Disp: , Rfl:     lancets Misc, Use to check blood sugar as directed 2 times a day & for symptoms of high or low blood sugar., Disp: 100 each, Rfl: 0    letermovir  (PREVYMIS ) 480 mg tablet, Take 1 tablet (480 mg total) by mouth daily., Disp: 28 tablet, Rfl: 5    levalbuterol  (XOPENEX ) 0.31 mg/3 mL nebulizer solution, Inhale 3 mL (0.31 mg total) by nebulization every four (4) hours as needed for wheezing (cough)., Disp: 45 mL, Rfl: 0    losartan  (COZAAR ) 50 MG tablet, Take 1 tablet (50 mg total) by mouth daily., Disp: 30 tablet, Rfl: 3    PONATinib  (ICLUSIG ) 30 mg tablet, Take 1 tablet (30 mg total) by mouth daily. Swallow tablets whole. Do not crush, break, cut or chew tablets., Disp: 30 tablet, Rfl: 5    predniSONE  (DELTASONE ) 50  MG tablet, Take 4 tablets (200 mg total) by mouth once daily on days 1-5 of each cycle., Disp: 20 tablet, Rfl: 5    rosuvastatin  (CRESTOR ) 10 MG tablet, Take 1 tablet (10 mg total) by mouth nightly., Disp: 90 tablet, Rfl: 3    valACYclovir  (VALTREX ) 500 MG tablet, Take 1 tablet (500 mg total) by mouth daily., Disp: 30 tablet, Rfl: 12    Allergies  Erythromycin, Other, Sulfa (sulfonamide antibiotics), and Azithromycin    Family History  History reviewed. No pertinent family history.    Social History  Social History     Tobacco Use    Smoking status: Former     Current packs/day: 0.00     Types: Cigarettes     Quit date: 11/12/2015     Years since quitting: 8.0    Smokeless tobacco: Never   Substance Use Topics    Alcohol use: Not Currently    Drug use: Never        Physical Exam     VITAL SIGNS:      Vitals:    11/24/23 0727 11/24/23 0735 11/24/23 1200   BP:  147/113 166/111   Pulse: 116 98    Resp:  20    Temp:  37.5 ??C (99.5 ??F)    TempSrc:  Oral    SpO2: 98%  93%   Weight:  92 kg (202 lb 13.2 oz)        Constitutional: Alert and oriented. No acute distress.  Eyes: Conjunctivae are normal.  HEENT: Normocephalic and atraumatic. Conjunctivae clear. No congestion. Moist mucous membranes.  +Cervical paraspinal tendernass. No nuchal rigidity  Cardiovascular: Rate as above, regular rhythm. Normal and symmetric distal pulses. Brisk capillary refill. Normal skin turgor.  Respiratory: Normal respiratory effort. Breath sounds are normal. There are no wheezing or crackles heard.  Gastrointestinal: Soft, non-distended, non-tender.  Genitourinary: Deferred.  Musculoskeletal: Non-tender with normal range of motion in all extremities.  Strength 5 out of 5 throughout all extremities.  Neurologic: Normal speech and language. No focal neurologic deficits. Patient is moving all extremities equally, face is symmetric at rest and with speech.  CN II through XII intact.  Appropriate speed of RAM.  Appropriate finger-to-nose. (She did note increase head pain after RAM and finger-to-nose examination)  Skin: Skin is warm, dry and intact. No rash noted.  Psychiatric: Mood and affect are normal. Speech and behavior are normal.     Radiology     MRI Cervical Spine W Wo Contrast    (Results Pending)   MRI Brain W Wo Contrast    (Results Pending)       Pertinent labs & imaging results that were available during my care of the patient were independently interpreted by me and considered in my medical decision making (see chart for details).    Portions of this record have been created using Scientist, clinical (histocompatibility and immunogenetics). Dictation errors have been sought, but may not have been identified and corrected.         Sherrlyn Dolores, DO  Resident  11/24/23 [phone]

## 2023-11-24 NOTE — Unmapped (Signed)
 Hematology Resident (MEDE) History & Physical    Assessment & Plan:   Patricia [name] is a 48 y.o. female with PMH notable for PH+ B-ALL who presented to [facility] with in the setting of headache, nausea/vomiting, with concern for possible CNS involvement of her leukemia.     Principal Problem:    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)  Active Problems:    Hypertension        Active Problems  Headache  Concern for CNS Leukemia vs. PRES  Hypertensive urgency vs. Emergency   Patient presenting in the setting of a weeks-long history of headaches, worsening in frequency and severity and characterized as bilateral, radiating up the neck/around the temples. Patient also endorses vision changes including blurrier vision, seeing halos around lights, not always coinciding with her headaches. No associated weakness/numbness on exam, cranial nerves grossly intact with MRI brain and spine showing T2 Flair hyperintensities concerning for toxic metabolic encephalopathy vs. Leukemic involvement given history of Ph+ ALL as below. She did trial taking gabapentin for her headaches recently but endorses nausea both times after trying, has since self-discontinued with no further nausea. She also has elevated diastolic pressures in the setting of self discontinuing her home losartan  due to liver injury as below, however spoke with radiology who indicated the pattern of her imaging is less likely to be consistent with PRES. Overall I am concerned for CNS leukemia vs. Infectious etiologies vs. Hepatic enceophalopathy possible although patient's synthetic function appears to be preserved. Would recommend getting an LP, patient does express a preference for this not to be performed under fluoro if possible despite having had this in the past.  -Med M consult for LP, patient expresses preference for peri-procedure ativan  however in setting of shortage ordered one time midazolam  PRN  -labetolol PRM SBP>180 or DBP>120 -Continue home valtrex , prevymis   -For headache: patient has indicated she prefers benadryl  and reglan, holding tylenol  iso liver injury     Acute Liver Injury  Hx CMV Viremia   Detected on lab work 04/22 with ALT 1663 up from 56 and AST 752 up from 27, mild ALP elevation thus consistent with hepatocellular injury. Differential includes DILI, shock liver, autoimmune hepatitis, acute hepatitis. Patient also has history of CMV viremia although her counts have recovered at this point and she should have some degree of functional immunity.   -Trend hepatic function panel   -Trend PT-INR, CBC/Diff, CMP/Mg/Phos  -Acute Hepatitis Panel  -Continue home ppx with valtrex , letermovir     Ph+ ALL, in remission:  Follows with Dr. Wayna Hails. Diagnosed 2023. Currently on ponatinib    -Notify Dr. Wayna Hails of admission     Chronic Problems  T2DM: Continue home glargine 10U , SSI  HLD: rosuvastatin  10 mg nightly     Checklist:  Diet: Regular Diet  DVT PPx: Contraindicated 2/2 LP  Code Status: Full Code  Dispo: Patient appropriate for admission     Chief Concern:   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)      Subjective:       HPI:  Patient endorses headaches radiating from the neck to the front of the head, with associated right sided ocular headache    Designated Healthcare Decision Maker:  [name] currently has decisional capacity for healthcare decision-making and is able to designate a surrogate healthcare decision maker. Ms. Bakken designated healthcare decision maker(s) is/are Euel Herring (the patient's spouse) as denoted by stated patient preference.    Objective:   Physical Exam:  Temp:  [  36.6 ??C (97.8 ??F)-36.9 ??C (98.4 ??F)] 36.6 ??C (97.9 ??F)  Pulse:  [88-96] 91  SpO2 Pulse:  [80-98] 87  Resp:  [18] 18  BP: (150-166)/(103-124) 150/116  SpO2:  [93 %-98 %] 96 %    Gen: NAD, converses   Eyes: Sclera anicteric, EOMI grossly normal   HENT: atraumatic, normocephalic  Neck: trachea midline  Heart: RRR  Lungs: CTAB, no crackles or wheezes  Abdomen: soft, NTND  Extremities: no edema  Neuro: grossly symmetric   Skin:  No rashes, lesions on clothed exam  Psych: Alert, oriented

## 2023-11-24 NOTE — Unmapped (Signed)
 Pt reports to ED with head, neck, abd, lower back pain -- she states this has been ongoing x1 month and her onc team wanted her to come in for eval.

## 2023-11-24 NOTE — Unmapped (Signed)
 Pt comes in wheelchair with complaints of pain in head x1 month. Has seen many specialties for this problem. Has hx of leukemia (ALL), diabetes. Also endorses NV. Has been taking excedrin and now has elevated LFTs. Pt in NAD in triage. Talking in full sentences.

## 2023-11-25 ENCOUNTER — Ambulatory Visit: Admit: 2023-11-25 | Discharge: 2023-11-25 | Payer: PRIVATE HEALTH INSURANCE

## 2023-11-25 ENCOUNTER — Inpatient Hospital Stay: Admit: 2023-11-25 | Discharge: 2023-11-25 | Payer: PRIVATE HEALTH INSURANCE

## 2023-11-25 DIAGNOSIS — C9100 Acute lymphoblastic leukemia not having achieved remission: Principal | ICD-10-CM

## 2023-11-25 LAB — PROTIME-INR
INR: 1.03
PROTIME: 11.7 s (ref 9.9–12.6)

## 2023-11-25 LAB — COMPREHENSIVE METABOLIC PANEL
ALBUMIN: 4.1 g/dL (ref 3.4–5.0)
ALKALINE PHOSPHATASE: 249 U/L — ABNORMAL HIGH (ref 46–116)
ALT (SGPT): 1056 U/L — ABNORMAL HIGH (ref 10–49)
ANION GAP: 10 mmol/L (ref 5–14)
AST (SGOT): 71 U/L — ABNORMAL HIGH (ref <=34)
BILIRUBIN TOTAL: 0.5 mg/dL (ref 0.3–1.2)
BLOOD UREA NITROGEN: 16 mg/dL (ref 9–23)
BUN / CREAT RATIO: 34
CALCIUM: 10 mg/dL (ref 8.7–10.4)
CHLORIDE: 101 mmol/L (ref 98–107)
CO2: 25 mmol/L (ref 20.0–31.0)
CREATININE: 0.47 mg/dL — ABNORMAL LOW (ref 0.55–1.02)
EGFR CKD-EPI (2021) FEMALE: >90 mL/min/1.73m2 (ref >=60)
GLUCOSE RANDOM: 281 mg/dL — ABNORMAL HIGH (ref 70–179)
POTASSIUM: 3.9 mmol/L (ref 3.4–4.8)
PROTEIN TOTAL: 7.2 g/dL (ref 5.7–8.2)
SODIUM: 136 mmol/L (ref 135–145)

## 2023-11-25 LAB — CBC W/ AUTO DIFF
BASOPHILS ABSOLUTE COUNT: 0 10*9/L (ref 0.0–0.1)
BASOPHILS RELATIVE PERCENT: 0.5 %
EOSINOPHILS ABSOLUTE COUNT: 0 10*9/L (ref 0.0–0.5)
EOSINOPHILS RELATIVE PERCENT: 0.6 %
HEMATOCRIT: 35.7 % (ref 34.0–44.0)
HEMOGLOBIN: 12 g/dL (ref 11.3–14.9)
LYMPHOCYTES ABSOLUTE COUNT: 1.1 10*9/L (ref 1.1–3.6)
LYMPHOCYTES RELATIVE PERCENT: 15.1 %
MEAN CORPUSCULAR HEMOGLOBIN CONC: 33.6 g/dL (ref 32.0–36.0)
MEAN CORPUSCULAR HEMOGLOBIN: 31.6 pg (ref 25.9–32.4)
MEAN CORPUSCULAR VOLUME: 94.1 fL (ref 77.6–95.7)
MEAN PLATELET VOLUME: 8.8 fL (ref 6.8–10.7)
MONOCYTES ABSOLUTE COUNT: 0.5 10*9/L (ref 0.3–0.8)
MONOCYTES RELATIVE PERCENT: 7.1 %
NEUTROPHILS ABSOLUTE COUNT: 5.7 10*9/L (ref 1.8–7.8)
NEUTROPHILS RELATIVE PERCENT: 76.7 %
PLATELET COUNT: 160 10*9/L (ref 150–450)
RED BLOOD CELL COUNT: 3.79 10*12/L — ABNORMAL LOW (ref 3.95–5.13)
RED CELL DISTRIBUTION WIDTH: 17.1 % — ABNORMAL HIGH (ref 12.2–15.2)
WBC ADJUSTED: 7.5 10*9/L (ref 3.6–11.2)

## 2023-11-25 LAB — TOXICOLOGY SCREEN, URINE
AMPHETAMINE SCREEN URINE: NEGATIVE
BARBITURATE SCREEN URINE: NEGATIVE
BENZODIAZEPINE SCREEN, URINE: NEGATIVE
BUPRENORPHINE, URINE SCREEN: NEGATIVE
CANNABINOID SCREEN URINE: NEGATIVE
COCAINE(METAB.)SCREEN, URINE: NEGATIVE
FENTANYL SCREEN, URINE: NEGATIVE
METHADONE SCREEN, URINE: NEGATIVE
OPIATE SCREEN URINE: NEGATIVE
OXYCODONE SCREEN URINE: NEGATIVE

## 2023-11-25 LAB — PHOSPHORUS: PHOSPHORUS: 3.4 mg/dL (ref 2.4–5.1)

## 2023-11-25 LAB — GAMMA GT: GAMMA GLUTAMYL TRANSFERASE: 836 U/L — ABNORMAL HIGH (ref 0–38)

## 2023-11-25 LAB — MAGNESIUM: MAGNESIUM: 1.9 mg/dL (ref 1.6–2.6)

## 2023-11-25 MED ADMIN — insulin glargine (LANTUS) injection 10 Units: 10 [IU] | SUBCUTANEOUS | @ 03:00:00 | Stop: 2023-12-24

## 2023-11-25 MED ADMIN — acetylcysteine (ACETADOTE) 13,740 mg in dextrose 5 % 250 mL infusion: 150 mg/kg | INTRAVENOUS | @ 19:00:00 | Stop: 2023-11-25

## 2023-11-25 MED ADMIN — diclofenac sodium (VOLTAREN) 1 % gel 4 g: 4 g | TOPICAL | @ 17:00:00

## 2023-11-25 MED ADMIN — dapsone tablet 100 mg: 100 mg | ORAL | @ 14:00:00 | Stop: 2023-12-25

## 2023-11-25 MED ADMIN — metoclopramide (REGLAN) injection 10 mg: 10 mg | INTRAVENOUS | @ 01:00:00 | Stop: 2023-11-24

## 2023-11-25 MED ADMIN — diphenhydrAMINE (BENADRYL) injection 25 mg: 25 mg | INTRAVENOUS | @ 01:00:00 | Stop: 2023-11-24

## 2023-11-25 MED ADMIN — bismuth subsalicylate (PEPTO-BISMOL) oral suspension: 30 mL | ORAL | @ 21:00:00

## 2023-11-25 MED ADMIN — diphenhydrAMINE (BENADRYL) injection: 25 mg | INTRAVENOUS | @ 14:00:00

## 2023-11-25 MED ADMIN — valACYclovir (VALTREX) tablet 500 mg: 500 mg | ORAL | @ 06:00:00 | Stop: 2023-12-24

## 2023-11-25 MED ADMIN — diphenhydrAMINE (BENADRYL) injection: 25 mg | INTRAVENOUS | @ 22:00:00 | Stop: 2023-11-25

## 2023-11-25 MED ADMIN — acetylcysteine (ACETADOTE) 30 mg/mL in dextrose 1,000 mL infusion: 15 mg/kg/h | INTRAVENOUS | @ 20:00:00 | Stop: 2023-11-26

## 2023-11-25 MED ADMIN — sodium chloride 0.9% (NS) bolus 500 mL: 500 mL | INTRAVENOUS | @ 17:00:00 | Stop: 2023-11-25

## 2023-11-25 MED ADMIN — escitalopram oxalate (LEXAPRO) tablet 10 mg: 10 mg | ORAL | @ 14:00:00 | Stop: 2023-12-25

## 2023-11-25 MED ADMIN — metoclopramide (REGLAN) injection 10 mg: 10 mg | INTRAVENOUS | @ 06:00:00 | Stop: 2023-11-25

## 2023-11-25 MED ADMIN — metoclopramide (REGLAN) injection 10 mg: 10 mg | INTRAVENOUS | @ 14:00:00

## 2023-11-25 MED ADMIN — magnesium sulfate 2gm/50mL IVPB: 2 g | INTRAVENOUS | @ 17:00:00 | Stop: 2023-11-25

## 2023-11-25 MED ADMIN — labetalol (NORMODYNE) injection: 10 mg | INTRAVENOUS | @ 06:00:00 | Stop: 2023-11-25

## 2023-11-25 NOTE — Unmapped (Addendum)
 Patricia [name] is an 48 y.o. female, with a pertinent PMHx of Ph+ ALL, who presented with a 1 month hx of headaches. 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 Her CSF on 5/12 was negative for blasts; as such, blinatumomab  for MRD was started 5/13. She tolerated blinatumomab  infusions well (only side effect being mild tremors) and was discharged with plans for outpatient blinatumomab  infusions.    #Headaches  #Ph+ B-ALL with CNS involvement  Hx Ph+ B-ALL previously in CR on maintenance vincristine /ponatinib . Follows with Dr. Wayna Hails. Presented after having a 1 month hx of bilateral, worsening headaches with visual changes but no focal neurological deficits. Workup, which included MRI (showing hyperintensities c/w leukemic involvement) and LP cx, confirmed CNS Ph+ B-ALL relapse and ruled out infection. For Ph+ B-ALL with CNS involvement (79% blasts), biweekly IT MTX was started. First dose of IT MTX was given 4/28. Repeat LP with CSF studies were sent 5/12; on 5/13, CSF had undetectable blasts. As such, IT MTX dosing was reduced from twice weekly to once weekly and blinatumomab  was started inpatient 5/13. Only side effect from blinatumomab  was mild tremors that were improving at time of discharge.  - Continue blinatumomab  for MRD as an outpatient  - Continue LP with IT MTX every Monday  - Continue ponatinib  30 mg daily and bASA daily iso ponatinib  use  - Continue dapsone  100 mg daily, letermovir  480 mg daily, Valtrex  500 mg daily for ppx     #Transaminitis (resolved)  #DILI iso heavy Excedrin use (resolved)  On admission, transaminitis with AST 752, ALT 1663, and ALP mildly elevated. Per R factor, injury c/w hepatocellular injury. Was taking heavy doses of Excedrin for headaches, which is most likely explanation for transaminitis. Other workup ruled out CMV viremia (PCR negative), veno-occlusive leukemic infiltration, and portal vein thrombosis (Liver Doppler U/S negative). Hepatology was consulted and deemed likely etiology to be Excedrin/Tylenol  overdose. Transaminitis resolved with gentle Tylenol  PRN for pain at time of admission.    #Neck pain, chronic  CT C-spine at OSH showed degenerative disc disease at C5-C6 with no spinal cord compression. No acute changes during this admission; low c/f meningitis based on lack of nuchal rigidity, lack of focal neurological deficits, and lack of infectious stigmata. Managed conservatively during this admission with Tylenol , Voltaren  gel, and ice packs.    #T2DM on insulin   Newly dx in April 2025, was beginning to uptitrate insulin  regimen. Last A1c in March 2025 c/w T2DM. At time of admission, was using CGM and Lantus  10 u qHS at home. BG were significantly elevated during this admission, in large part due to dexamethasone  administration for management of Ph+ ALL. At time of discharge, inpatient insulin  regimen included Lantus  30 u qHS and SSI (ISF 30).  - Continue Lantus  30 u qHS, SSI, and BG checks ACHS  - Titrate insulin  as outpatient. Will likely need bolus injections with meals.     #HTN  Hx primary HTN, was taking losartan  50 mg daily at home. BP normotensive with reduced losartan  dose during this admission.  - Continue losartan  25 mg daily, uptitrate as appropriate     #HLD  Held home rosuvastatin  10 mg qHS due to transaminitis iso heavy Excedrin use. Recommend outpatient follow-up of LFTs and reinitiation of rosuvastatin  if appropriate (i.e., normal LFTs).

## 2023-11-25 NOTE — Unmapped (Signed)
 Hepatology Consult Service   Initial Consultation         Assessment and Recommendations:   Patricia [name] is a 48 y.o. female with a PMHx of PH+ B-ALL (on ponatinib )  who presented to [facility] with headaches. The patient is seen in consultation at the request of Joshua Franklin Zeidner, MD (Oncology/Hematology (MDE)) for elevated LFTs.    #Elevated transaminases:  Patient with newly elevated at LFTs 4/22 with ALT 1663 AST 752 and mild ALP elevation in the setting of normal T bili.  Overall most consistent with hepatocellular injury.   Reassuringly patient with preserved synthetic function and mental status intact.  Given significant Excedrin use just preceding this elevation high suspicion for DILI from acetaminophen .  Patient also with recent Augmentin  use which could further exacerbate DILI.  Differential also includes viral hepatitis, autoimmune hepatitis.  Patient with minimal alcohol use thus alcohol-related injury less likely.  Differential also includes lymphoma thus would obtain ultrasound liver to better evaluate. Recommend treating with NAC for 24 hours.  And continue to monitor LFTs.     - CBC, BMP, INR Daily  - Hep B sAg/sAb/cAb and Hep C Ab, Hep A IgM  - US  Liver Doppler  - Start N-Acetylecysteine IV    Issues Impacting Complexity of Management:  -None    Recommendations discussed with the patient's primary team. We will continue to follow along with you.    Subjective:   Patient presented to [facility] for headaches radiating from the neck down to the front of the head so shaded with vision changes.  Underwent MRI brain and spine with T2 flair hyperintensities.  Undergoing workup for evaluation of possible CNS EMEA versus infection.  Treat headaches she had been taking Excedrin 2 pills every 3-6 hours up until 4/22 when she was found to have elevated LFTs and instructed to stop taking Excedrin.    Objective:   Temp:  [36.6 ??C (97.8 ??F)-36.9 ??C (98.4 ??F)] 36.6 ??C (97.9 ??F)  Pulse:  [88-96] 91  SpO2 Pulse: [80-98] 87  Resp:  [18] 18  BP: (150-166)/(103-124) 150/116  SpO2:  [93 %-98 %] 96 %    Gen: NAD, answers questions appropriately  Abdomen: Soft, NTND, no rebound/guarding  Extremities: No edema in the BLEs    Pertinent Labs & Studies:  -I have reviewed the patient's labs from 11/25/23 which show stable Hgb, stable renal function (SCr, electrolytes), improving LFTs, and stable INR

## 2023-11-25 NOTE — Unmapped (Signed)
 Medicine Procedure Service - New Procedure Request Triage    Procedure Requested: Lumbar puncture  Indication: Headache, immunocompromised patient  Urgency: Within next 24 hours    Questions for all procedures    1. Is the patient able to give consent for this procedure? - Yes. Is there a consent in media tab that covers a year? No.   2. If no, has family to give consent been identified? - NA  3. Have you explained to the patient/family why you are recommending this procedure? - Yes  4. Safe to give peri-procedural anxiolysis? (i.e, lorazepam  1 mg IV; if so, team or M can order) - Yes  5. Has team placed med procedure consult order? (If not, please remind team to do so) - Yes  6. Is patient currently on anticoagulation? No     INR   Date Value Ref Range Status   11/25/2023 1.03  Final     PT   Date Value Ref Range Status   11/25/2023 11.7 9.9 - 12.6 sec Final     APTT   Date Value Ref Range Status   11/24/2023 117.8 (H) 24.8 - 38.4 sec Final     Platelet   Date Value Ref Range Status   11/25/2023 160 150 - 450 10*9/L Final       Lumbar Puncture    Does the patient need an opening pressure? - Yes  Does the patient need intrathecal chemotherapy? No  Is patient able to cooperate with positioning? (if not, may not be candidate for bedside LP) - Yes  Has team ordered studies? - Yes  Any special studies needed? (Fungal culture, AFB, and cytology all require large-volume samples) - Yes    Bleeding risk assessment (this is moderate to high risk procedure for bleeding, see below)  Yes to any below requires discussion with attending and may require delay in procedure:   If coags not sent this admission, is there any reason to think this patient may have a coagulopathy (if so, send coags)? No  INR >1.5? No  Platelets <30K - No  Prophylactic LMWH in past 12 hours? - No  Therapeutic LMWH in past 24 hours? - No  On a DOAC? Apixaban or rivaroxabin in past 48 hours, or dabigatran in past 72 hours? - No  On high dose ASA or other anti-platelet in past 5-7 days? - Yes, has been taking 8 tablets of Excedrin (2g daily) for headache, last dose was 4/22. Earliest date safe for LP is 4/27.  Has had CNS imaging or needs CNS imaging? (Will need if > 59 years old, immunocompromised, has had a seizure within one week of presentation, has an abnormal level of consciousness, abnormal neurologic exam, or papilledema, if no imaging in past 30 days, or if finding are new since last imaging.) - No  Any evidence of elevated ICP on imaging? (will at least need discussion with neurosurgery, neurology and/or neuroradiology before proceeding if yes) - No      Medicine Procedure Service  Guidelines for Peri-procedural Management of Bleeding Risk (revised 09/24/2023)   Low Bleeding Risk  Paracentesis  Thoracentesis (see above note for when to call IP)  I&D  CVC  Arthrocentesis   Moderate-High Bleeding Risk  Lumbar puncture*   INR <2-3, N/a for cirrhosis <2   Platelet >20 >30        ASA, low dose Not required to hold Do not hold   ASA, high dose Not required to hold Stop 5 days before  Clopidogrel Not required to hold Stop 5 days before   Ticagrelor Not required to hold Stop 5 days before   Prasugrel Not required to hold Stop 7 days before        UFH Not required to hold Stop 2-4 hours before   LMWH (prophylactic) Not required to hold Stop 12 hours before   LMWH (treatment) Not required to hold Stop 24 hours before   UFH infusion Not required to stop, may consider 6 hour hold if feasible Stop 6 hours before   Argatroban infusion Not required to stop, may consider 6 hour hold if feasible Stop  6 hours before        Warfarin INR < 3 Stop until INR < 1.8, consider 4 factor prothrombin complex concentrate with heme consult   Apixaban (Eliquis), BID Not required to hold Hold 4 doses (GFR > 50) or 6 doses (GFR <30-50). If urgent, see table below and consider heme consult.   Dabigatran (Pradaxa), BID Not require to hold Hold 5-6 doses (GFR > 50) or 7-8 doses (GFR < 30-50).  If urgent, see table below and consider heme consult.   Rivaroxaban (Xarelto), maintenance once daily Not required to hold Hold for 2 doses (GFR > 30) or 3 doses (GFR < 30).  If urgent, see table below and consider heme consult.     2019 SIR guidelines now consider LP to be a low-risk procedure, was a moderate risk procedure in the 2012 guidelines. [facility] 2015 considers LP high risk, as does [facility] Alva Jewels 2022 guideline (Visio-clin-management-peri-procedure-anticoagulants-web-algorithm.vsd)  While thoracentesis is considered a low-risk procedure, discuss with IP if patient is on antiplatelets (other than ASA) or DOAC  Adapted from the 2019 Society of Interventional Radiology Consensus Guidelines: https://www.jvir.org/article/S1051-0443(19)30407-5/pdf  While thoracentesis and paracentesis may not require holding anticoagulation, if safe and convenient to hold, doing so may reduce risk for hemorrhage if a vessel is inadvertently punctured  For LP and procedures necessitating multiple punctures, do not restart anticoagulation for at least 6 hours

## 2023-11-25 NOTE — Unmapped (Addendum)
 Poor pain control for headaches at this time. Prns that a currently ordered only offering minimal relief. HTN. Falls and safety precautions maintained.       Problem: Adult Inpatient Plan of Care  Goal: Plan of Care Review  Outcome: Progressing  Flowsheets (Taken 11/25/2023 0357)  Plan of Care Reviewed With: patient  Goal: Patient-Specific Goal (Individualized)  Outcome: Progressing  Goal: Absence of Hospital-Acquired Illness or Injury  Outcome: Progressing  Goal: Optimal Comfort and Wellbeing  Outcome: Progressing  Goal: Readiness for Transition of Care  Outcome: Progressing  Goal: Rounds/Family Conference  Outcome: Progressing     Problem: Fall Injury Risk  Goal: Absence of Fall and Fall-Related Injury  Outcome: Progressing     Problem: Comorbidity Management  Goal: Maintenance of Asthma Control  Outcome: Progressing  Goal: Maintenance of Behavioral Health Symptom Control  Outcome: Progressing  Goal: Maintenance of COPD Symptom Control  Outcome: Progressing  Goal: Blood Glucose Levels Within Targeted Range  Outcome: Progressing  Goal: Maintenance of Heart Failure Symptom Control  Outcome: Progressing  Goal: Blood Pressure in Desired Range  Outcome: Progressing  Goal: Maintenance of Osteoarthritis Symptom Control  Outcome: Progressing  Goal: Bariatric Home Regimen Maintained  Outcome: Progressing  Goal: Maintenance of Seizure Control  Outcome: Progressing     Problem: Pain Acute  Goal: Optimal Pain Control and Function  Outcome: Progressing

## 2023-11-25 NOTE — Unmapped (Signed)
 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 female with past medical history including ph+ ALL in remission who was admitted for headache found to have MRI findings cf. myelitis.    Headache - Concern for CNS Leukemia  Patient presenting in the setting of a weeks-long history of headaches, worsening in frequency and severity and characterized as bilateral, radiating up the neck/around the temples. Patient also endorses vision changes including blurrier vision, seeing halos around lights, not always coinciding with her headaches. No associated weakness/numbness on exam, cranial nerves grossly intact with MRI brain and spine showing T2 Flair hyperintensities concerning for toxic metabolic encephalopathy vs. Leukemic involvement given history of Ph+ ALL as below. She did trial taking gabapentin for her headaches recently but endorses nausea both times after trying, has since self-discontinued with no further nausea. She also has elevated diastolic pressures in the setting of self discontinuing her home losartan  due to liver injury as below, however spoke with radiology who indicated the pattern of her imaging is less likely to be consistent with PRES. Overall I am concerned for CNS leukemia vs. Infectious etiologies vs. Hepatic enceophalopathy possible although patient's synthetic function appears to be preserved. Would recommend getting an LP, patient does express a preference for this not to be performed under fluoro if possible despite having had this in the past.  CONSULT med M for LP  Require washout for aspirin given high doses in Excedrin Migraine that she was using  Diagnostic LP with opening pressure, cell count, glucose, protein, heme path, bacterial culture, fungal culture, HSV, VZV, CMV, crypto, toxo  labetolol PRM SBP>180 or DBP>120 Continue home valtrex , prevymis   For headache: patient has indicated she prefers benadryl  and reglan     Acute liver injury - concern for Tylenol  toxicity  Detected on lab work 04/22 with ALT 1663 up from 56 and AST 752 up from 27, mild ALP elevation thus consistent with hepatocellular injury. Differential includes DILI, shock liver, autoimmune hepatitis, acute hepatitis. Patient also has history of CMV viremia however CMV PCR was negative at last appointment 4/22.  CONSULT hepatology, appreciate recs  Follow-up acute hepatitis panel, HIV  ORDERED liver Doppler  ORDERED NAC 24 hours  Trend hepatic function panel   Trend PT-INR, CBC/Diff, CMP/Mg/Phos  Continue home ppx with valtrex , letermovir      Ph+ ALL, in remission  Follows with Dr. Wayna Hails. Diagnosed 2023. Currently on ponatinib    HOLD ponatinib  while evaluating liver injury     Chronic Problems  T2DM: Continue home glargine 10U , holding SSI per patient request, monitoring ACHS POC  HLD: HOLD rosuvastatin  10 mg nightly   Impending Electrolyte Abnormality Secondary to Chemotherapy and/or IV Fluids  -Daily Electrolyte monitoring  -Replete per [facility] guidelines.     Immunocompromised status: Patient is immunocompromised secondary to disease and chemotherapy  -Antimicrobial prophylaxis as above    Impending Pancytopenia secondary to Acute Leukemia and chemotherapy:   - Transfuse hgb <7  - Transfuse plt <10K    Nutrition:                        Subjective:     Interval History: .     Patient still endorsing headache with primary trigger being movement.  Voltaren  gel has helped  some with muscular component but not with overall neck pain.  Still best benefit with Benadryl  and Reglan.  Experienced some facial flushing tingling and mildly elevated temperature to 90F with beginning of NAC infusion.  Will give Benadryl , take 1 hour break, and reassess.  Lungs CTAB.  On lit review this is not uncommon (6%) in patients receiving NAC infusion.    10 point ROS otherwise negative except as above in the HPI.     Objective:     Vital signs in last 24 hours:  Temp:  [36.6 ??C (97.8 ??F)-36.9 ??C (98.4 ??F)] 36.6 ??C (97.9 ??F)  Pulse:  [81-96] 87  SpO2 Pulse:  [87-98] 87  Resp:  [15-18] 18  BP: (150-170)/(103-124) 170/103  MAP (mmHg):  [120-138] 123  SpO2:  [93 %-98 %] 94 %  BMI (Calculated):  [32.62] 32.62    Intake/Output last 3 shifts:  No intake/output data recorded.    Meds:  Current Facility-Administered Medications   Medication Dose Route Frequency Provider Last Rate Last Admin    acetylcysteine (ACETADOTE) 30 mg/mL in dextrose  1,000 mL infusion  15 mg/kg/hr Intravenous Continuous Tip Atienza W, MD 45.8 mL/hr at 11/25/23 1553 15 mg/kg/hr at 11/25/23 1553    albuterol  (PROVENTIL  HFA;VENTOLIN  HFA) 90 mcg/actuation inhaler 2 puff  2 puff Inhalation Q6H PRN Daphene Dys, MD        bismuth  subsalicylate (PEPTO-BISMOL) oral suspension  30 mL Oral Q6H PRN Loletha Ripper, MD        cetirizine  (ZYRTEC ) tablet 10 mg  10 mg Oral Nightly Daphene Dys, MD        dapsone  tablet 100 mg  100 mg Oral Daily Daphene Dys, MD   100 mg at 11/25/23 1028    dextrose  50 % in water (D50W) 50 % solution 12.5 g  12.5 g Intravenous Q15 Min PRN Cookmeyer, Helga Loan, MD        diclofenac  sodium (VOLTAREN ) 1 % gel 4 g  4 g Topical QID Loletha Ripper, MD   4 g at 11/25/23 1258    diphenhydrAMINE  (BENADRYL ) injection  25 mg Intravenous Q8H PRN Almond Fitzgibbon W, MD   25 mg at 11/25/23 0981    [Provider Hold] enoxaparin (LOVENOX) syringe 40 mg  40 mg Subcutaneous Q24H Cookmeyer, Helga Loan, MD        escitalopram  oxalate (LEXAPRO ) tablet 10 mg  10 mg Oral Daily Daphene Dys, MD   10 mg at 11/25/23 1028    glucagon injection 1 mg  1 mg Intramuscular Once PRN Cookmeyer, Helga Loan, MD        glucose chewable tablet 16 g  16 g Oral Q10 Min PRN Cookmeyer, Helga Loan, MD        insulin glargine (LANTUS) injection 10 Units  10 Units Subcutaneous Nightly Daphene Dys, MD   10 Units at 11/24/23 2240    [Provider Hold] insulin lispro (HumaLOG) injection 0-20 Units  0-20 Units Subcutaneous ACHS Cookmeyer, Helga Loan, MD        labetalol (NORMODYNE) injection  10 mg Intravenous Q6H PRN Cookmeyer, Helga Loan, MD        letermovir  (PREVYMIS ) tablet 480 mg  480 mg Oral Daily Daphene Dys, MD        metoclopramide (REGLAN) injection 10 mg  10 mg Intravenous Q8H PRN Patricio Popwell W, MD   10 mg at 11/25/23 1914    midazolam  (VERSED ) injection 1 mg  1 mg Intravenous Once PRN Cookmeyer,  Helga Loan, MD        oxyCODONE  (ROXICODONE ) immediate release tablet 5 mg  5 mg Oral Q4H PRN Cookmeyer, Helga Loan, MD        valACYclovir  (VALTREX ) tablet 500 mg  500 mg Oral Daily Teressa Ferdinand, MD   500 mg at 11/25/23 0137       Physical Exam:  General: Resting, in no apparent distress, lying in bed  HEENT:  PERRL. No scleral icterus or conjunctival injection. MMM without ulceration, erythema or exudate. No cervical or axillary lymphadenopathy.   Heart:  RRR. S1, S2. No murmurs, gallops, or rubs.  Lungs:  Breathing is unlabored, and patient is speaking full sentences with ease.  No stridor.  CTAB. No rales, ronchi, or crackles.    Abdomen:  No distention or pain on palpation.  Bowel sounds are present and normoactive x 4.  No palpable hepatomegaly or splenomegaly.  No palpable masses.  Skin:  No rashes, petechiae or purpura.  No areas of skin breakdown. Warm to touch, dry, smooth, and even.  Musculoskeletal:  No grossly-evident joint effusions or deformities.  Range of motion about the shoulder, elbow, hips and knees is grossly normal.  Psychiatric:  Range of affect is appropriate.    Neurologic:  Alert and oriented to person, place, time and situation.  Gait is normal.  No Nystagmus Cerebellar tasks (finger-to-nose, rapid hand movement, heel along shin) are completed with ease and are symmetric. CNII-CNXII grossly intact.  Extremities:  Appear well-perfused. No clubbing, edema, or cyanosis.  CVAD: R CW Port - no erythema, nontender; dressing CDI.      Labs:  Recent Labs     11/24/23  0911 11/25/23  0833   WBC 9.2 7.5   NEUTROABS 7.1 5.7   LYMPHSABS 1.3 1.1   HGB 13.0 12.0   HCT 37.8 35.7   PLT 157 160   CREATININE 0.46* 0.47*   BUN 17 16   BILITOT 0.6 0.5   BILIDIR 0.20  --    AST 184* 71*   ALT 1,520* 1,056*   ALKPHOS 296* 249*   K 3.9 3.9   MG  --  1.9   CALCIUM  10.3 10.0   NA 139 136   CL 101 101   CO2 25.0 25.0   PHOS  --  3.4   INR 1.04 1.03       Imaging:  MRI cervical spine with longitudinally extensive cord signal abnormality suggestive of acute myelitis.  MRI brain with T2 flair signal abnormality.  Differential broad but appears to be less consistent with infection.      Loletha Ripper, MD,   11/25/2023  4:31 PM   Hematology/Oncology Department   [facility]   Group pager: [phone]

## 2023-11-25 NOTE — Unmapped (Signed)
 Pt arrived to BMTU, BP still elevated but is trending down, complaints of headache still persist,  provider paged. Other VSS.

## 2023-11-26 DIAGNOSIS — C9100 Acute lymphoblastic leukemia not having achieved remission: Principal | ICD-10-CM

## 2023-11-26 LAB — COMPREHENSIVE METABOLIC PANEL
ALBUMIN: 4.1 g/dL (ref 3.4–5.0)
ALKALINE PHOSPHATASE: 227 U/L — ABNORMAL HIGH (ref 46–116)
ALT (SGPT): 814 U/L — ABNORMAL HIGH (ref 10–49)
ANION GAP: 12 mmol/L (ref 5–14)
AST (SGOT): 48 U/L — ABNORMAL HIGH (ref <=34)
BILIRUBIN TOTAL: 0.6 mg/dL (ref 0.3–1.2)
BLOOD UREA NITROGEN: 11 mg/dL (ref 9–23)
BUN / CREAT RATIO: 24
CALCIUM: 10.3 mg/dL (ref 8.7–10.4)
CHLORIDE: 101 mmol/L (ref 98–107)
CO2: 25 mmol/L (ref 20.0–31.0)
CREATININE: 0.45 mg/dL — ABNORMAL LOW (ref 0.55–1.02)
EGFR CKD-EPI (2021) FEMALE: >90 mL/min/1.73m2 (ref >=60)
GLUCOSE RANDOM: 256 mg/dL — ABNORMAL HIGH (ref 70–179)
POTASSIUM: 3.7 mmol/L (ref 3.4–4.8)
PROTEIN TOTAL: 7.3 g/dL (ref 5.7–8.2)
SODIUM: 138 mmol/L (ref 135–145)

## 2023-11-26 LAB — HEMATOPATHOLOGY LEUKEMIA/LYMPHOMA FLOW CYTOMETRY, CSF
NUCLEATED CELLS, CSF: 1467 ul — ABNORMAL HIGH (ref <=5)
RBC CSF: <2 ul (ref <2)

## 2023-11-26 LAB — MAGNESIUM: MAGNESIUM: 2.3 mg/dL (ref 1.6–2.6)

## 2023-11-26 LAB — CBC W/ AUTO DIFF
BASOPHILS ABSOLUTE COUNT: 0.1 10*9/L (ref 0.0–0.1)
BASOPHILS RELATIVE PERCENT: 0.6 %
EOSINOPHILS ABSOLUTE COUNT: 0.1 10*9/L (ref 0.0–0.5)
EOSINOPHILS RELATIVE PERCENT: 0.6 %
HEMATOCRIT: 36.3 % (ref 34.0–44.0)
HEMOGLOBIN: 12.3 g/dL (ref 11.3–14.9)
LYMPHOCYTES ABSOLUTE COUNT: 1.4 10*9/L (ref 1.1–3.6)
LYMPHOCYTES RELATIVE PERCENT: 15.6 %
MEAN CORPUSCULAR HEMOGLOBIN CONC: 33.8 g/dL (ref 32.0–36.0)
MEAN CORPUSCULAR HEMOGLOBIN: 32 pg (ref 25.9–32.4)
MEAN CORPUSCULAR VOLUME: 94.6 fL (ref 77.6–95.7)
MEAN PLATELET VOLUME: 9 fL (ref 6.8–10.7)
MONOCYTES ABSOLUTE COUNT: 0.9 10*9/L — ABNORMAL HIGH (ref 0.3–0.8)
MONOCYTES RELATIVE PERCENT: 9.8 %
NEUTROPHILS ABSOLUTE COUNT: 6.5 10*9/L (ref 1.8–7.8)
NEUTROPHILS RELATIVE PERCENT: 73.4 %
PLATELET COUNT: 181 10*9/L (ref 150–450)
RED BLOOD CELL COUNT: 3.83 10*12/L — ABNORMAL LOW (ref 3.95–5.13)
RED CELL DISTRIBUTION WIDTH: 17.4 % — ABNORMAL HIGH (ref 12.2–15.2)
WBC ADJUSTED: 8.9 10*9/L (ref 3.6–11.2)

## 2023-11-26 LAB — PHOSPHORUS: PHOSPHORUS: 2.7 mg/dL (ref 2.4–5.1)

## 2023-11-26 LAB — PROTIME-INR
INR: 1.04
PROTIME: 11.9 s (ref 9.9–12.6)

## 2023-11-26 MED ADMIN — losartan (COZAAR) tablet 25 mg: 25 mg | ORAL | @ 16:00:00

## 2023-11-26 MED ADMIN — diphenhydrAMINE (BENADRYL) injection: 25 mg | INTRAVENOUS

## 2023-11-26 MED ADMIN — cetirizine (ZYRTEC) tablet 10 mg: 10 mg | ORAL | Stop: 2023-12-24

## 2023-11-26 MED ADMIN — dapsone tablet 100 mg: 100 mg | ORAL | @ 12:00:00 | Stop: 2023-12-25

## 2023-11-26 MED ADMIN — diphenhydrAMINE (BENADRYL) 50 mg/mL injection: INTRAVENOUS | @ 21:00:00 | Stop: 2023-11-26

## 2023-11-26 MED ADMIN — diphenhydrAMINE (BENADRYL) injection: 25 mg | INTRAVENOUS | @ 18:00:00 | Stop: 2023-11-26

## 2023-11-26 MED ADMIN — diclofenac sodium (VOLTAREN) 1 % gel 4 g: 4 g | TOPICAL

## 2023-11-26 MED ADMIN — letermovir (PREVYMIS) tablet 480 mg: 480 mg | ORAL | Stop: 2023-12-25

## 2023-11-26 MED ADMIN — magnesium sulfate 2gm/50mL IVPB: 2 g | INTRAVENOUS | @ 22:00:00 | Stop: 2023-11-26

## 2023-11-26 MED ADMIN — diphenhydrAMINE (BENADRYL) injection: 25 mg | INTRAVENOUS | @ 21:00:00

## 2023-11-26 MED ADMIN — diclofenac sodium (VOLTAREN) 1 % gel 4 g: 4 g | TOPICAL | @ 16:00:00

## 2023-11-26 MED ADMIN — midazolam (VERSED) injection 1 mg: 1 mg | INTRAVENOUS | @ 13:00:00 | Stop: 2023-11-26

## 2023-11-26 MED ADMIN — diclofenac sodium (VOLTAREN) 1 % gel 4 g: 4 g | TOPICAL | @ 21:00:00

## 2023-11-26 MED ADMIN — insulin lispro (HumaLOG) injection 0-20 Units: 0-20 [IU] | SUBCUTANEOUS | @ 16:00:00

## 2023-11-26 MED ADMIN — insulin glargine (LANTUS) injection 10 Units: 10 [IU] | SUBCUTANEOUS | @ 02:00:00 | Stop: 2023-12-24

## 2023-11-26 MED ADMIN — metoclopramide (REGLAN) injection 10 mg: 10 mg | INTRAVENOUS

## 2023-11-26 MED ADMIN — metoclopramide (REGLAN) injection 10 mg: 10 mg | INTRAVENOUS | @ 16:00:00 | Stop: 2023-11-26

## 2023-11-26 MED ADMIN — escitalopram oxalate (LEXAPRO) tablet 10 mg: 10 mg | ORAL | @ 12:00:00 | Stop: 2023-12-25

## 2023-11-26 MED ADMIN — insulin lispro (HumaLOG) injection 0-20 Units: 0-20 [IU] | SUBCUTANEOUS | @ 21:00:00

## 2023-11-26 MED ADMIN — labetalol (NORMODYNE) injection: 10 mg | INTRAVENOUS | @ 05:00:00

## 2023-11-26 MED ADMIN — diphenhydrAMINE (BENADRYL) injection: 25 mg | INTRAVENOUS | @ 16:00:00 | Stop: 2023-11-26

## 2023-11-26 NOTE — Unmapped (Signed)
 Lumbar Puncture Procedure (CPT [id number]) with ultrasound guidance (CPT [id number])     Pre-procedural Planning     Patient Name:: JAIMARIE COSLEY  Patient MRN: [id number]    Pre-operative Diagnosis:  Immunocompromised    Post-operative Diagnosis: Immunocompromised    Indications:  Immunocompromised    Contraindications to performing lumbar puncture: Patient had no contraindications. Specifically, no local skin infections over the proposed puncture site, known elevated ICP other than in idiopathic intracranial hypertension or cryptococcal meningitis, and/or an uncontrolled bleeding diathesis.     Imaging prior to lumbar puncture: Generally, imaging should be obtained prior to performing a lumbar puncture if the patient is > 46 years old, immunocompromised, has had a seizure within one week of presentation, has an abnormal level of consciousness, or an abnormal neurologic exam (including papilledema). CNS imaging has been obtained and does not indicate increased ICP.    Known Bleeding Diathesis: Patient/caregiver denies any known bleeding or platelet disorder.     Antiplatelet Agents: This patient is not on an antiplatelet agent. Had been taking high dose ASA for HA (Exedrin), held since 4/22.    Systemic Anticoagulation: This patient is not on full systemic anticoagulation.    Significant Labs:  INR   Date Value Ref Range Status   11/26/2023 1.04  Final     PT   Date Value Ref Range Status   11/26/2023 11.9 9.9 - 12.6 sec Final     APTT   Date Value Ref Range Status   11/24/2023 117.8 (H) 24.8 - 38.4 sec Final     Platelet   Date Value Ref Range Status   11/26/2023 181 150 - 450 10*9/L Final       Consent: Informed consent for the procedure was obtained from Patient after explanation of the risks, including bleeding into pain, post-lumbar puncture headache, backache, bleeding that could result in paralysis, herniation and infection, and benefits, including symptomatic improvement and diagnostic information. Potential alternatives were discussed and all questions answered.  Refer to the consent documentation.     Procedure Details     Time-out performed immediately prior to the procedure. If IT chemo given, 2 providers checked chemo orders prior to beginning procedure.    Patient was placed in the left lateral decubitus position with hips and neck in flexion.    The superior aspect of the iliac crests were identified, with the traverse demarcating the L4-L5 interspace.  A bedside ultrasound was used to help identify the spinous processes at L3, L4, and L5 and the intervertebral spaces were located and marked.          This area was prepped with chlorhexidine and draped in the usual sterile fashion. Sterile technique was used including antiseptics, cap, gloves, hand hygiene, mask, and sterile sheet.  Local anesthesia with 1% lidocaine  was applied subcutaneously then deep to the skin. The 22 gauge cutting (eg. Quincke) spinal needle with trocar was introduced at the L3 - L4 interspace.  CSF was obtained at 7 cm after 1 pass(es), 1 redirections.  When CSF fluid was noted an opening pressure was obtained.  Samples were collected and sent to the lab after proper labeling.       The spinal needle with trocar was removed with minimal bleeding noted upon removal. A sterile bandage was placed over the puncture site after holding pressure.    Findings     16 mL of clear spinal fluid was obtained.  The CSF was sent to lab for routine studies including cell  count, protein, glucose, culture and gram stain.  Additional studies were sent, including fungal cx, HSV, cytology.    Opening Pressure: 31 cm H2O pressure.            Condition     The patient tolerated the procedure well and remains in the same condition as pre-procedure.    Complications and Recommendations     None; patient tolerated the procedure well.      Requesting Service: Oncology/Hematology (MDE)    Time Requested: 0800  Time Completed: 0930  Comments: Prior LP photo from 12/24 used to help guide procedure location.     Resident(s) Performing Procedure: Marrie Sizer, MD  Resident Year: PGY1    Lattie Poli, MD was present and supervised this procedure.

## 2023-11-26 NOTE — Unmapped (Signed)
 Patricia [name] remains mostly adherent to treatment/medication regimen.  [name] declines short-acting insulin for elevated blood glucose levels (states, I'll be okay). IV Acetadote was stopped due to discomfort and side effects.  Elevated diastolic treated with IV labetalol (effective). Migraines treated with Reglan/Benadryl  combo.     Problem: Adult Inpatient Plan of Care  Goal: Plan of Care Review  Outcome: Progressing  Goal: Patient-Specific Goal (Individualized)  Outcome: Progressing  Goal: Absence of Hospital-Acquired Illness or Injury  Outcome: Progressing  Intervention: Identify and Manage Fall Risk  Recent Flowsheet Documentation  Taken 11/26/2023 0600 by Trude Furry, RN  Safety Interventions: low bed  Taken 11/26/2023 0400 by Trude Furry, RN  Safety Interventions: low bed  Taken 11/26/2023 0200 by Trude Furry, RN  Safety Interventions: low bed  Taken 11/25/2023 2215 by Trude Furry, RN  Safety Interventions:   low bed   nonskid shoes/slippers when out of bed  Taken 11/25/2023 2025 by Trude Furry, RN  Safety Interventions:   low bed   lighting adjusted for tasks/safety   nonskid shoes/slippers when out of bed   fall reduction program maintained  Intervention: Prevent Infection  Recent Flowsheet Documentation  Taken 11/25/2023 2025 by Trude Furry, RN  Infection Prevention:   single patient room provided   rest/sleep promoted   hand hygiene promoted  Goal: Optimal Comfort and Wellbeing  Outcome: Progressing  Goal: Readiness for Transition of Care  Outcome: Progressing  Goal: Rounds/Family Conference  Outcome: Progressing     Problem: Fall Injury Risk  Goal: Absence of Fall and Fall-Related Injury  Outcome: Progressing  Intervention: Promote Injury-Free Environment  Recent Flowsheet Documentation  Taken 11/26/2023 0600 by Trude Furry, RN  Safety Interventions: low bed  Taken 11/26/2023 0400 by Trude Furry, RN  Safety Interventions: low bed  Taken 11/26/2023 0200 by Trude Furry, RN  Safety Interventions: low bed  Taken 11/25/2023 2215 by Trude Furry, RN  Safety Interventions:   low bed   nonskid shoes/slippers when out of bed  Taken 11/25/2023 2025 by Trude Furry, RN  Safety Interventions:   low bed   lighting adjusted for tasks/safety   nonskid shoes/slippers when out of bed   fall reduction program maintained     Problem: Comorbidity Management  Goal: Maintenance of Asthma Control  Outcome: Progressing  Goal: Maintenance of Behavioral Health Symptom Control  Outcome: Progressing  Goal: Maintenance of COPD Symptom Control  Outcome: Progressing  Goal: Blood Glucose Levels Within Targeted Range  Outcome: Progressing  Goal: Maintenance of Heart Failure Symptom Control  Outcome: Progressing  Goal: Blood Pressure in Desired Range  Outcome: Progressing  Goal: Maintenance of Osteoarthritis Symptom Control  Outcome: Progressing  Goal: Bariatric Home Regimen Maintained  Outcome: Progressing  Goal: Maintenance of Seizure Control  Outcome: Progressing     Problem: Pain Acute  Goal: Optimal Pain Control and Function  Outcome: Progressing

## 2023-11-26 NOTE — Unmapped (Signed)
 Daily Progress Note      Principal Problem:    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)  Active Problems:    Hypertension       LOS: 2 days     Assessment/Plan:Patricia [name] is an 48 y.o. female with past medical history including ph+ ALL in remission who was admitted for headache found to have MRI findings cf. myelitis.    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 Patient presenting in the setting of a weeks-long history of bilateral worsening headaches radiating up the neck/around the temples, vision changes including blurrier vision, and seeing halos around lights not always coinciding with her headaches. Has improved a little with metoclopramide and benadryl . No weakness/numbness, CNs grossly intact, spine showing T2 Flair hyperintensities c/f Leukemic involvement given history of Ph+ ALL as below. S/p LP 4/27 with cytology pending, however preliminary CSF analysis showing 1567 nucleated cells/uL, initial estimation of 70-80% blasts (final report pending). Plan to move forward with IT MTX tomorrow. Will require 2x weekly chemo inpatient until clearance.  S/p Lumbar Puncture 4/27; opening pressure 31 mmHg; 1567 nucleated cells/uL; 70-80% blasts (final read pending)  Diagnostic LP with opening pressure, cell count, glucose, protein, heme path, bacterial culture, fungal culture, HSV, VZV, CMV, crypto, toxo  Continue home valtrex , prevymis   For headache: PRN metoclopramide & PRN diphenhydramine   HOLD ponatanib while awaiting liver recovery    Hypertension  History of chronic hypertension, home dosage of Losartan  50 mg. During admission has sustained elevated systolics and diastolics in the setting of self dc of home losartan  due to her liver injury. Given low suspicion for losartan  hepatotixicty as driver of pts liver injury will plan to resume home losartan .   START PO Losartan  25 mg  Continue Labetalol PRN SBP>180 or DBP>120     Acute Liver Injury - C/f Tylenol  Toxicity  Labs 4/22 with ALT 1663 up from 56 and AST 752 up from 27, mild ALP elevation thus consistent with hepatocellular injury. Differential includes DILI, shock liver, autoimmune hepatitis, acute hepatitis. Patient also has history of CMV viremia however CMV PCR was negative at last appointment 4/22. Liver doppler 4/26 without signs of veno-occlusive leukemic infiltration or portal vein thrombosis. Tylenol  discontinued 4/22. NAC trialed on 4/26, however discontinued due to a reaction involving acute flushing and difficulty breathing. Labs improving (on 4/27 AST down to 48, ALT to 814) and will continue to trend.  Hepatology consulted, appreciate recs  Follow-up acute hepatitis panel, HIV  Trend hepatic function panel   Trend PT-INR, CBC/Diff, CMP/Mg/Phos  Continue home ppx with valtrex , letermovir     T2DM  Home glargine 10U. POC glucose 413 on 4/27, started patient on a carbohydrate controlled diet (45/45/45) with additional high protein-low GI protein supplements.  Continuing home glargine 10U  RESUMING SSI    Chronic Problems  HLD: HOLD rosuvastatin  10 mg nightly     Impending Electrolyte Abnormality Secondary to Chemotherapy and/or IV Fluids  -Daily Electrolyte monitoring  -Replete per [facility] guidelines.     Immunocompromised status: Patient is immunocompromised secondary to disease and chemotherapy  -Antimicrobial prophylaxis as above    Impending Pancytopenia secondary to Acute Leukemia and chemotherapy:   - Transfuse hgb <7  - Transfuse plt <10K    Nutrition:                        Subjective:     Interval History: .  Patient still endorsing headache but some minor improvement with metoclopramide and benadryl . Cites ice pack provided by overnight nurse as a Secretary/administrator for the headache. Overnight could not tolerate NAC - discontinued.     10 point ROS otherwise negative except as above in the HPI.     Objective:     Vital signs in last 24 hours:  Temp:  [36.4 ??C (97.5 ??F)-37 ??C (98.6 ??F)] 36.4 ??C (97.5 ??F)  Pulse:  [81-106] 103  Resp:  [15-18] 16  BP: (135-178)/(101-129) 135/101  MAP (mmHg):  [111-142] 111  SpO2:  [94 %-100 %] 95 %    Intake/Output last 3 shifts:  No intake/output data recorded.    Meds:  Current Facility-Administered Medications   Medication Dose Route Frequency Provider Last Rate Last Admin    albuterol  (PROVENTIL  HFA;VENTOLIN  HFA) 90 mcg/actuation inhaler 2 puff  2 puff Inhalation Q6H PRN Daphene Dys, MD        bismuth  subsalicylate (PEPTO-BISMOL) oral suspension  30 mL Oral Q6H PRN Codie Krogh W, MD   30 mL at 11/25/23 1648    cetirizine  (ZYRTEC ) tablet 10 mg  10 mg Oral Nightly Daphene Dys, MD   10 mg at 11/25/23 2016    dapsone  tablet 100 mg  100 mg Oral Daily Daphene Dys, MD   100 mg at 11/26/23 0827    dextrose  50 % in water (D50W) 50 % solution 12.5 g  12.5 g Intravenous Q15 Min PRN Cookmeyer, Helga Loan, MD        diclofenac  sodium (VOLTAREN ) 1 % gel 4 g  4 g Topical QID Loletha Ripper, MD   4 g at 11/25/23 2017    diphenhydrAMINE  (BENADRYL ) injection  25 mg Intravenous Q8H PRN Loletha Ripper, MD   25 mg at 11/25/23 2024    [Provider Hold] enoxaparin (LOVENOX) syringe 40 mg  40 mg Subcutaneous Q24H Cookmeyer, Helga Loan, MD        escitalopram  oxalate (LEXAPRO ) tablet 10 mg  10 mg Oral Daily Daphene Dys, MD   10 mg at 11/26/23 0827    glucagon injection 1 mg  1 mg Intramuscular Once PRN Cookmeyer, Helga Loan, MD        glucose chewable tablet 16 g  16 g Oral Q10 Min PRN Cookmeyer, Helga Loan, MD        insulin glargine (LANTUS) injection 10 Units  10 Units Subcutaneous Nightly Daphene Dys, MD   10 Units at 11/25/23 2153    insulin lispro (HumaLOG) injection 0-20 Units  0-20 Units Subcutaneous ACHS Loletha Ripper, MD        labetalol (NORMODYNE) injection  10 mg Intravenous Q6H PRN Teressa Ferdinand, MD   10 mg at 11/26/23 0033    labetalol (NORMODYNE) injection  10 mg Intravenous Once Cookmeyer, David L, MD        letermovir  (PREVYMIS ) tablet 480 mg  480 mg Oral Daily Daphene Dys, MD   480 mg at 11/25/23 2016    losartan  (COZAAR ) tablet 25 mg  25 mg Oral Daily Francys Bolin W, MD        metoclopramide (REGLAN) injection 10 mg  10 mg Intravenous Q8H PRN Sana Tessmer W, MD   10 mg at 11/25/23 2025    oxyCODONE  (ROXICODONE ) immediate release tablet 5 mg  5 mg Oral Q4H PRN Cookmeyer, Helga Loan, MD        valACYclovir  (VALTREX ) tablet 500 mg  500 mg Oral  Daily Teressa Ferdinand, MD   500 mg at 11/25/23 1610       Physical Exam:  General: Resting, in no apparent distress, lying in bed  HEENT:  PERRL. EOMI. Normocephalic, atraumatic  Heart:  RRR.   Lungs:  Normal WOB on room air. CTAB.  Abdomen:  No distention or pain on palpation.    Skin:  No lesions on clothed exam.  Psychiatric:  Range of affect is appropriate.    Neurologic:  Alert and oriented to person, place, time and situation.  CNII-CNXII grossly intact.  Extremities:  Appear well-perfused. No clubbing, edema, or cyanosis.      Labs:  Recent Labs     11/24/23  0911 11/25/23  0833 11/26/23  0013   WBC 9.2 7.5 8.9   NEUTROABS 7.1 5.7 6.5   LYMPHSABS 1.3 1.1 1.4   HGB 13.0 12.0 12.3   HCT 37.8 35.7 36.3   PLT 157 160 181   CREATININE 0.46* 0.47* 0.45*   BUN 17 16 11    BILITOT 0.6 0.5 0.6   BILIDIR 0.20  --   --    AST 184* 71* 48*   ALT 1,520* 1,056* 814*   ALKPHOS 296* 249* 227*   K 3.9 3.9 3.7   MG  --  1.9 2.3   CALCIUM  10.3 10.0 10.3   NA 139 136 138   CL 101 101 101   CO2 25.0 25.0 25.0   PHOS  --  3.4 2.7   INR 1.04 1.03 1.04       Imaging:  No new.      Abner Hoffman, [facility]  11/26/2023  12:06 PM   Hematology/Oncology Department   [facility]   Group pager: [phone]     I attest that I have reviewed the medical student note and that the components of the history of the present illness, the physical exam, and the assessment and plan documented were performed by me or were performed in my presence by the student where I verified the documentation and performed (or re-performed) the exam and medical decision making.     Erving Heather, MD PhD  PGY-1 Internal Medicine  [facility]

## 2023-11-27 LAB — HEPATITIS B CORE ANTIBODY, TOTAL: HEPATITIS B CORE TOTAL ANTIBODY: NONREACTIVE

## 2023-11-27 LAB — COMPREHENSIVE METABOLIC PANEL
ALBUMIN: 4.1 g/dL (ref 3.4–5.0)
ALKALINE PHOSPHATASE: 204 U/L — ABNORMAL HIGH (ref 46–116)
ALT (SGPT): 599 U/L — ABNORMAL HIGH (ref 10–49)
ANION GAP: 8 mmol/L (ref 5–14)
AST (SGOT): 29 U/L (ref <=34)
BILIRUBIN TOTAL: 0.6 mg/dL (ref 0.3–1.2)
BLOOD UREA NITROGEN: 13 mg/dL (ref 9–23)
BUN / CREAT RATIO: 29
CALCIUM: 10.2 mg/dL (ref 8.7–10.4)
CHLORIDE: 103 mmol/L (ref 98–107)
CO2: 29 mmol/L (ref 20.0–31.0)
CREATININE: 0.45 mg/dL — ABNORMAL LOW (ref 0.55–1.02)
EGFR CKD-EPI (2021) FEMALE: >90 mL/min/1.73m2 (ref >=60)
GLUCOSE RANDOM: 182 mg/dL — ABNORMAL HIGH (ref 70–179)
POTASSIUM: 3.9 mmol/L (ref 3.4–4.8)
PROTEIN TOTAL: 7.2 g/dL (ref 5.7–8.2)
SODIUM: 140 mmol/L (ref 135–145)

## 2023-11-27 LAB — CBC W/ AUTO DIFF
BASOPHILS ABSOLUTE COUNT: 0 10*9/L (ref 0.0–0.1)
BASOPHILS RELATIVE PERCENT: 0.5 %
EOSINOPHILS ABSOLUTE COUNT: 0.1 10*9/L (ref 0.0–0.5)
EOSINOPHILS RELATIVE PERCENT: 0.7 %
HEMATOCRIT: 36.2 % (ref 34.0–44.0)
HEMOGLOBIN: 12.3 g/dL (ref 11.3–14.9)
LYMPHOCYTES ABSOLUTE COUNT: 1.5 10*9/L (ref 1.1–3.6)
LYMPHOCYTES RELATIVE PERCENT: 17.6 %
MEAN CORPUSCULAR HEMOGLOBIN CONC: 34 g/dL (ref 32.0–36.0)
MEAN CORPUSCULAR HEMOGLOBIN: 31.8 pg (ref 25.9–32.4)
MEAN CORPUSCULAR VOLUME: 93.6 fL (ref 77.6–95.7)
MEAN PLATELET VOLUME: 8.9 fL (ref 6.8–10.7)
MONOCYTES ABSOLUTE COUNT: 0.6 10*9/L (ref 0.3–0.8)
MONOCYTES RELATIVE PERCENT: 6.9 %
NEUTROPHILS ABSOLUTE COUNT: 6.2 10*9/L (ref 1.8–7.8)
NEUTROPHILS RELATIVE PERCENT: 74.3 %
PLATELET COUNT: 182 10*9/L (ref 150–450)
RED BLOOD CELL COUNT: 3.86 10*12/L — ABNORMAL LOW (ref 3.95–5.13)
RED CELL DISTRIBUTION WIDTH: 17.3 % — ABNORMAL HIGH (ref 12.2–15.2)
WBC ADJUSTED: 8.3 10*9/L (ref 3.6–11.2)

## 2023-11-27 LAB — HEMATOPATHOLOGY LEUKEMIA/LYMPHOMA FLOW CYTOMETRY, CSF
LYMPHS CSF: 15 %
LYMPHS CSF: 2 %
MONO/MACROPHAGE CSF: 1 %
NUCLEATED CELLS, CSF: 1467 ul — ABNORMAL HIGH (ref <=5)
NUCLEATED CELLS, CSF: 1980 ul — ABNORMAL HIGH (ref <=5)
NUMBER OF CELLS CSF: 100
OTHER  CELLS CSF: 84 %
OTHER  CELLS CSF: 98 %
RBC CSF: <2 ul (ref <2)
RBC CSF: 58 ul — ABNORMAL HIGH (ref <2)

## 2023-11-27 LAB — HEPATITIS PANEL, ACUTE
HEPATITIS A IGM ANTIBODY: NONREACTIVE
HEPATITIS B CORE IGM ANTIBODY: NONREACTIVE
HEPATITIS B SURFACE ANTIGEN: NONREACTIVE
HEPATITIS C ANTIBODY: NONREACTIVE

## 2023-11-27 LAB — SPINAL FLUID, PATH REVIEW

## 2023-11-27 LAB — PHOSPHORUS: PHOSPHORUS: 3.1 mg/dL (ref 2.4–5.1)

## 2023-11-27 LAB — HEPATITIS B SURFACE ANTIBODY
HEPATITIS B SURFACE ANTIBODY QUANT: 276.43 m[IU]/mL — ABNORMAL HIGH (ref <8.00)
HEPATITIS B SURFACE ANTIBODY: REACTIVE — AB

## 2023-11-27 LAB — HIV ANTIGEN/ANTIBODY COMBO: HIV ANTIGEN/ANTIBODY COMBO: NONREACTIVE

## 2023-11-27 LAB — PROTIME-INR
INR: 1.06
PROTIME: 12.1 s (ref 9.9–12.6)

## 2023-11-27 LAB — MAGNESIUM: MAGNESIUM: 2.3 mg/dL (ref 1.6–2.6)

## 2023-11-27 MED ADMIN — insulin lispro (HumaLOG) injection 0-6 Units: 0-6 [IU] | SUBCUTANEOUS | @ 22:00:00

## 2023-11-27 MED ADMIN — ondansetron (ZOFRAN) 4 mg/2 mL injection: INTRAVENOUS | @ 19:00:00 | Stop: 2023-11-27

## 2023-11-27 MED ADMIN — methotrexate (Preservative Free) 12 mg, hydrocortisone sod succ (Solu-CORTEF) 50 mg in sodium chloride (NS) 0.9 % 6 mL INTRATHECAL syringe: INTRATHECAL | @ 18:00:00 | Stop: 2023-11-27

## 2023-11-27 MED ADMIN — metoclopramide (REGLAN) injection 10 mg: 10 mg | INTRAVENOUS | @ 03:00:00

## 2023-11-27 MED ADMIN — midazolam (VERSED) injection 1 mg: 1 mg | INTRAVENOUS | @ 18:00:00 | Stop: 2023-11-27

## 2023-11-27 MED ADMIN — cetirizine (ZYRTEC) tablet 10 mg: 10 mg | ORAL | @ 01:00:00 | Stop: 2023-12-24

## 2023-11-27 MED ADMIN — diclofenac sodium (VOLTAREN) 1 % gel 4 g: 4 g | TOPICAL | @ 17:00:00

## 2023-11-27 MED ADMIN — dexAMETHasone (DECADRON) 4 mg/mL injection 4 mg: 4 mg | INTRAVENOUS | @ 17:00:00

## 2023-11-27 MED ADMIN — dapsone tablet 100 mg: 100 mg | ORAL | @ 12:00:00 | Stop: 2023-12-25

## 2023-11-27 MED ADMIN — HYDROmorphone (PF) (DILAUDID) injection Syrg 0.5 mg: .5 mg | INTRAVENOUS | @ 14:00:00 | Stop: 2023-11-27

## 2023-11-27 MED ADMIN — insulin lispro (HumaLOG) injection 0-20 Units: 0-20 [IU] | SUBCUTANEOUS | @ 01:00:00

## 2023-11-27 MED ADMIN — metoclopramide (REGLAN) injection 10 mg: 10 mg | INTRAVENOUS | @ 09:00:00

## 2023-11-27 MED ADMIN — losartan (COZAAR) tablet 25 mg: 25 mg | ORAL | @ 12:00:00

## 2023-11-27 MED ADMIN — valACYclovir (VALTREX) tablet 500 mg: 500 mg | ORAL | @ 01:00:00 | Stop: 2023-12-24

## 2023-11-27 MED ADMIN — insulin glargine (LANTUS) injection 10 Units: 10 [IU] | SUBCUTANEOUS | @ 01:00:00 | Stop: 2023-12-24

## 2023-11-27 MED ADMIN — diclofenac sodium (VOLTAREN) 1 % gel 4 g: 4 g | TOPICAL | @ 01:00:00

## 2023-11-27 MED ADMIN — insulin lispro (HumaLOG) injection 0-20 Units: 0-20 [IU] | SUBCUTANEOUS | @ 17:00:00 | Stop: 2023-11-27

## 2023-11-27 MED ADMIN — diclofenac sodium (VOLTAREN) 1 % gel 4 g: 4 g | TOPICAL | @ 22:00:00

## 2023-11-27 MED ADMIN — diclofenac sodium (VOLTAREN) 1 % gel 4 g: 4 g | TOPICAL | @ 10:00:00

## 2023-11-27 MED ADMIN — ondansetron (ZOFRAN) injection 4 mg: 4 mg | INTRAVENOUS | @ 19:00:00 | Stop: 2023-11-27

## 2023-11-27 MED ADMIN — prochlorperazine (COMPAZINE) injection 10 mg: 10 mg | INTRAVENOUS | @ 23:00:00

## 2023-11-27 MED ADMIN — escitalopram oxalate (LEXAPRO) tablet 10 mg: 10 mg | ORAL | @ 12:00:00 | Stop: 2023-12-25

## 2023-11-27 MED ADMIN — diphenhydrAMINE (BENADRYL) injection: 25 mg | INTRAVENOUS | @ 09:00:00

## 2023-11-27 MED ADMIN — dexAMETHasone (DECADRON) 4 mg/mL injection 4 mg: 4 mg | INTRAVENOUS | @ 22:00:00

## 2023-11-27 MED ADMIN — insulin lispro (HumaLOG) injection 0-20 Units: 0-20 [IU] | SUBCUTANEOUS | @ 12:00:00 | Stop: 2023-11-27

## 2023-11-27 MED ADMIN — diphenhydrAMINE (BENADRYL) injection: 25 mg | INTRAVENOUS | @ 03:00:00

## 2023-11-27 MED ADMIN — letermovir (PREVYMIS) tablet 480 mg: 480 mg | ORAL | @ 01:00:00 | Stop: 2023-12-25

## 2023-11-27 NOTE — Unmapped (Signed)
 Lumbar Puncture Procedure with ultrasound guidance (CPT [phone]) with Intrathecal Chemo Administration (CPT [phone])      Pre-procedural Planning     Patient Name:: Patricia [name]  Patient MRN: [id number]    Pre-operative Diagnosis:  Administration of intrathecal chemo    Post-operative Diagnosis: same as above    Indications:  same as above    Contraindications to performing lumbar puncture: Patient had no contraindications. Specifically, no local skin infections over the proposed puncture site, known elevated ICP other than in idiopathic intracranial hypertension or cryptococcal meningitis, and/or an uncontrolled bleeding diathesis.     Imaging prior to lumbar puncture: Generally, imaging should be obtained prior to performing a lumbar puncture if the patient is > 36 years old, immunocompromised, has had a seizure within one week of presentation, has an abnormal level of consciousness, or an abnormal neurologic exam (including papilledema). Patient has no indication for CNS imaging prior to proceding with lumbar puncture.    Known Bleeding Diathesis: Patient/caregiver denies any known bleeding or platelet disorder.     Antiplatelet Agents: This patient is not on an antiplatelet agent.    Systemic Anticoagulation: This patient is not on full systemic anticoagulation.    Significant Labs:  INR   Date Value Ref Range Status   11/27/2023 1.06  Final     PT   Date Value Ref Range Status   11/27/2023 12.1 9.9 - 12.6 sec Final     APTT   Date Value Ref Range Status   11/24/2023 117.8 (H) 24.8 - 38.4 sec Final     Platelet   Date Value Ref Range Status   11/27/2023 182 150 - 450 10*9/L Final       Consent: Informed consent for the procedure was obtained from Patient after explanation of the risks, including bleeding into pain, post-lumbar puncture headache, backache, bleeding that could result in paralysis, herniation and infection, and benefits, including symptomatic improvement and diagnostic information. Potential alternatives were discussed and all questions answered.  Refer to the consent documentation.     Procedure Details     Time-out performed immediately prior to the procedure. If IT chemo given, 2 providers checked chemo orders prior to beginning procedure.    Patient was placed in the left lateral decubitus position with hips and neck in flexion.    The superior aspect of the iliac crests were identified, with the traverse demarcating the L4-L5 interspace.  A bedside ultrasound was used to help identify the spinous processes at L3, L4, and L5 and the intervertebral spaces were located and marked.          This area was prepped with chlorhexidine and draped in the usual sterile fashion. Sterile technique was used including antiseptics, cap, gloves, hand hygiene, mask, and sterile sheet.  Local anesthesia with 1% lidocaine  was applied subcutaneously then deep to the skin. The 20 gauge cutting (eg. Quincke) spinal needle with trocar was introduced at the L2 - L3 interspace.  CSF was obtained at 6.5 cm after 1 pass(es), 2 redirections.  When CSF fluid was noted an opening pressure was not obtained.  Samples were collected and sent to the lab after proper labeling.     Intrathecal MTX was administered slowly over 5 min.    The spinal needle with trocar was removed with minimal bleeding noted upon removal. A sterile bandage was placed over the puncture site after holding pressure.    Findings     6 mL of colorless, hazy spinal fluid was  obtained.  The CSF was sent to lab for flow, protein, and glucose            Condition     The patient tolerated the procedure well and remains in the same condition as pre-procedure.    Complications and Recommendations     None; patient tolerated the procedure well.      Requesting Service: Oncology/Hematology (MDE)    Time Requested: 4/27  Time Completed: 4/28, 1510  Comments: Midazolam  1mg  given prior to procedure    Resident(s) Performing Procedure: Mel Spine  Resident Year: PGY1      I was present for the entire procedure.  Talitha Faden, MD

## 2023-11-27 NOTE — Unmapped (Signed)
 Pt alert and oriented x4. Pt remained afebrile throughout shift, VSS. Pt had some complaints of headache and neck pain, ice pack was applied and prn benadryl  and Reglan given twice. All safety measures in place throughout shift.     Problem: Adult Inpatient Plan of Care  Goal: Plan of Care Review  Outcome: Ongoing - Unchanged  Goal: Patient-Specific Goal (Individualized)  Outcome: Ongoing - Unchanged  Goal: Absence of Hospital-Acquired Illness or Injury  Outcome: Ongoing - Unchanged  Intervention: Identify and Manage Fall Risk  Recent Flowsheet Documentation  Taken 11/26/2023 1930 by Dasie Epps, RN  Safety Interventions:   bleeding precautions   environmental modification   fall reduction program maintained   infection management   isolation precautions   lighting adjusted for tasks/safety   low bed   nonskid shoes/slippers when out of bed   room near unit station  Intervention: Prevent Skin Injury  Recent Flowsheet Documentation  Taken 11/27/2023 0451 by Dasie Epps, RN  Positioning for Skin: Supine/Back  Taken 11/27/2023 0219 by Dasie Epps, RN  Positioning for Skin: Supine/Back  Taken 11/26/2023 1930 by Dasie Epps, RN  Positioning for Skin: Supine/Back  Intervention: Prevent Infection  Recent Flowsheet Documentation  Taken 11/26/2023 1930 by Dasie Epps, RN  Infection Prevention:   cohorting utilized   environmental surveillance performed   equipment surfaces disinfected   hand hygiene promoted   personal protective equipment utilized   rest/sleep promoted   single patient room provided   visitors restricted/screened  Goal: Optimal Comfort and Wellbeing  Outcome: Ongoing - Unchanged  Goal: Readiness for Transition of Care  Outcome: Ongoing - Unchanged  Goal: Rounds/Family Conference  Outcome: Ongoing - Unchanged     Problem: Fall Injury Risk  Goal: Absence of Fall and Fall-Related Injury  Outcome: Ongoing - Unchanged  Intervention: Promote Injury-Free Environment  Recent Flowsheet Documentation  Taken 11/26/2023 1930 by Dasie Epps, RN  Safety Interventions:   bleeding precautions   environmental modification   fall reduction program maintained   infection management   isolation precautions   lighting adjusted for tasks/safety   low bed   nonskid shoes/slippers when out of bed   room near unit station     Problem: Comorbidity Management  Goal: Maintenance of Asthma Control  Outcome: Ongoing - Unchanged  Goal: Maintenance of Behavioral Health Symptom Control  Outcome: Ongoing - Unchanged  Goal: Maintenance of COPD Symptom Control  Outcome: Ongoing - Unchanged  Goal: Blood Glucose Levels Within Targeted Range  Outcome: Ongoing - Unchanged  Goal: Maintenance of Heart Failure Symptom Control  Outcome: Ongoing - Unchanged  Goal: Blood Pressure in Desired Range  Outcome: Ongoing - Unchanged  Goal: Maintenance of Osteoarthritis Symptom Control  Outcome: Ongoing - Unchanged  Goal: Bariatric Home Regimen Maintained  Outcome: Ongoing - Unchanged  Goal: Maintenance of Seizure Control  Outcome: Ongoing - Unchanged     Problem: Pain Acute  Goal: Optimal Pain Control and Function  Outcome: Ongoing - Unchanged

## 2023-11-27 NOTE — Unmapped (Signed)
 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 female with past medical history including ph+ ALL in remission who was admitted for headache found to have MRI findings cf. myelitis.    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 Patient presenting in the setting of a weeks-long history of bilateral worsening headaches radiating up the neck/around the temples, vision changes including blurrier vision, and seeing halos around lights not always coinciding with her headaches. Has improved a little with metoclopramide and benadryl . No weakness/numbness, CNs grossly intact, spine showing T2 Flair hyperintensities c/f Leukemic involvement given history of Ph+ ALL as below. S/p LP 4/27 with cytology pending, however preliminary CSF analysis showing 1567 nucleated cells/uL, initial estimation of 70-80% blasts (final report pending). Plan to move forward with IT MTX tomorrow. Will require 2x weekly chemo inpatient until clearance.  S/p diagnostic Lumbar Puncture 4/27; opening pressure 31 mmHg; 1567 nucleated cells/uL; 70-80% blasts (final read pending)  Crypto negative  Follow up final heme path, bacterial culture, fungal culture, HSV, VZV, CMV, toxo  Plan for IT methotrexate  with med procedures 4/28  START dexamethasone  4mg  q6h  For headache: PRN metoclopramide & PRN diphenhydramine , previously provided most benefit  HOLD ponatanib while awaiting liver recovery (restart when <3xULN ALT)  Continue home valtrex , prevymis     Hypertension  History of chronic hypertension, home dosage of Losartan  50 mg. During admission has sustained elevated systolics and diastolics in the setting of self dc of home losartan  due to her liver injury. Given low suspicion for losartan  hepatotixicty as driver of pts liver injury will plan to resume home losartan .   Continue PO Losartan  25 mg (half home dose) up titrate as needed  Continue Labetalol PRN SBP>180 or DBP>120     Acute Liver Injury - C/f Tylenol  Toxicity  Labs 4/22 with ALT 1663 up from 56 and AST 752 up from 27, mild ALP elevation thus consistent with hepatocellular injury. Differential includes DILI, shock liver, autoimmune hepatitis, acute hepatitis. Patient also has history of CMV viremia however CMV PCR was negative at last appointment 4/22. Liver doppler 4/26 without signs of veno-occlusive leukemic infiltration or portal vein thrombosis. Tylenol  discontinued 4/22. NAC trialed on 4/26, however discontinued due to a reaction involving acute flushing and difficulty breathing. Labs improving and will continue to trend.  Hepatology consulted, appreciate recs  Follow-up acute hepatitis panel, HIV  Trend hepatic function panel   Trend PT-INR, CBC/Diff, CMP/Mg/Phos  Continue home ppx with valtrex , letermovir     T2DM  Home glargine 10U. POC glucose 413 on 4/27, started patient on a carbohydrate controlled diet (45/45/45) with additional high protein-low GI protein supplements.  START NPH, increase to 7u BID  INCREASED SSI to SF 30  Monitor for initiation of AC iso dexamethasone  initiated 4/28    Chronic Problems  HLD: HOLD rosuvastatin  10 mg nightly     Impending Electrolyte Abnormality Secondary to Chemotherapy and/or IV Fluids  -Daily Electrolyte monitoring  -Replete per [facility] guidelines.     Immunocompromised status: Patient  is immunocompromised secondary to disease and chemotherapy  -Antimicrobial prophylaxis as above    Impending Pancytopenia secondary to Acute Leukemia and chemotherapy:   - Transfuse hgb <7  - Transfuse plt <10K    Nutrition:                        Subjective:     Interval History: . Ongoing head and neck pain.  Described a muscular component as well.  Did a trial of 0.5 mg Dilaudid  without much pain benefit but with adverse effect of dizziness.  Will not retry.  Will initiate dexamethasone  while waiting for effects of IT chemotherapy.  I discussed LP results concerning for CNS leukemia yesterday 4/27.    10 point ROS otherwise negative except as above in the HPI.     Objective:     Vital signs in last 24 hours:  Temp:  [36.5 ??C (97.7 ??F)-37.1 ??C (98.7 ??F)] 37.1 ??C (98.7 ??F)  Pulse:  [83-94] 83  Resp:  [16-18] 17  BP: (139-155)/(103-112) 146/103  MAP (mmHg):  [113-126] 113  SpO2:  [94 %-97 %] 95 %    Intake/Output last 3 shifts:  No intake/output data recorded.    Meds:  Current Facility-Administered Medications   Medication Dose Route Frequency Provider Last Rate Last Admin    albuterol  (PROVENTIL  HFA;VENTOLIN  HFA) 90 mcg/actuation inhaler 2 puff  2 puff Inhalation Q6H PRN Daphene Dys, MD        bismuth  subsalicylate (PEPTO-BISMOL) oral suspension  30 mL Oral Q6H PRN Meenakshi Sazama W, MD   30 mL at 11/25/23 1648    [START ON 11/28/2023] cetirizine  (ZYRTEC ) tablet 10 mg  10 mg Oral Daily Loletha Ripper, MD        CHEMO CLARIFICATION ORDER   Other Continuous PRN Stacey Dyke, MD        dapsone  tablet 100 mg  100 mg Oral Daily Daphene Dys, MD   100 mg at 11/27/23 1610    dexAMETHasone  (DECADRON ) 4 mg/mL injection 4 mg  4 mg Intravenous Q6H SCH Linus Weckerly W, MD   4 mg at 11/27/23 1326    dextrose  50 % in water (D50W) 50 % solution 12.5 g  12.5 g Intravenous Q15 Min PRN Cookmeyer, Helga Loan, MD        diclofenac  sodium (VOLTAREN ) 1 % gel 4 g  4 g Topical QID Loletha Ripper, MD   4 g at 11/27/23 1230    diphenhydrAMINE  (BENADRYL ) injection  25 mg Intravenous Q6H PRN Cornett, Fletcher Humble, MD   25 mg at 11/27/23 0451    [Provider Hold] enoxaparin (LOVENOX) syringe 40 mg  40 mg Subcutaneous Q24H Cookmeyer, Helga Loan, MD        escitalopram  oxalate (LEXAPRO ) tablet 10 mg 10 mg Oral Daily Daphene Dys, MD   10 mg at 11/27/23 9604    glucagon injection 1 mg  1 mg Intramuscular Once PRN Cookmeyer, Helga Loan, MD        glucose chewable tablet 16 g  16 g Oral Q10 Min PRN Cookmeyer, Helga Loan, MD        insulin lispro (HumaLOG) injection 0-20 Units  0-20 Units Subcutaneous ACHS Sheriann Newmann W, MD        insulin NPH (HumuLIN,NovoLIN) injection 7 Units  7 Units Subcutaneous Q12H [facility] Schlauch W, MD        IP OKAY TO TREAT   Other Continuous PRN Junnie Olives  Gregory Leash, MD        labetalol (NORMODYNE) injection  10 mg Intravenous Q6H PRN Teressa Ferdinand, MD   10 mg at 11/26/23 0033    letermovir  (PREVYMIS ) tablet 480 mg  480 mg Oral Daily Daphene Dys, MD   480 mg at 11/26/23 2110    losartan  (COZAAR ) tablet 25 mg  25 mg Oral Daily Wilene Pharo W, MD   25 mg at 11/27/23 1610    metoclopramide (REGLAN) injection 10 mg  10 mg Intravenous Q6H PRN Cornett, Fletcher Humble, MD   10 mg at 11/27/23 0451    oxyCODONE  (ROXICODONE ) immediate release tablet 5 mg  5 mg Oral Q4H PRN Teressa Ferdinand, MD        valACYclovir  (VALTREX ) tablet 500 mg  500 mg Oral Daily Teressa Ferdinand, MD   500 mg at 11/26/23 2110       Physical Exam:  General: Resting, in no apparent distress, lying in bed  HEENT:  PERRL. EOMI. Normocephalic, atraumatic  Heart:  RRR.   Lungs:  Normal WOB on room air. CTAB.  Abdomen:  No distention or pain on palpation.    Skin:  No lesions on clothed exam.  Psychiatric:  Range of affect is appropriate.    Neurologic:  Alert and oriented to person, place, time and situation.  CNII-CNXII grossly intact.  Extremities:  Appear well-perfused. No clubbing, edema, or cyanosis.      Labs:  Recent Labs     11/25/23  0833 11/26/23  0013 11/27/23  0238   WBC 7.5 8.9 8.3   NEUTROABS 5.7 6.5 6.2   LYMPHSABS 1.1 1.4 1.5   HGB 12.0 12.3 12.3   HCT 35.7 36.3 36.2   PLT 160 181 182   CREATININE 0.47* 0.45* 0.45*   BUN 16 11 13    BILITOT 0.5 0.6 0.6   AST 71* 48* 29   ALT 1,056* 814* 599* ALKPHOS 249* 227* 204*   K 3.9 3.7 3.9   MG 1.9 2.3 2.3   CALCIUM  10.0 10.3 10.2   NA 136 138 140   CL 101 101 103   CO2 25.0 25.0 29.0   PHOS 3.4 2.7 3.1   INR 1.03 1.04 1.06       Imaging:  No new.      Loletha Ripper, MD, MS4  11/27/2023  4:20 PM   Hematology/Oncology Department   [facility]   Group pager: [phone]     I attest that I have reviewed the medical student note and that the components of the history of the present illness, the physical exam, and the assessment and plan documented were performed by me or were performed in my presence by the student where I verified the documentation and performed (or re-performed) the exam and medical decision making.     Erving Heather, MD PhD  PGY-1 Internal Medicine  [facility]

## 2023-11-28 DIAGNOSIS — C9100 Acute lymphoblastic leukemia not having achieved remission: Principal | ICD-10-CM

## 2023-11-28 LAB — MAGNESIUM: MAGNESIUM: 2.1 mg/dL (ref 1.6–2.6)

## 2023-11-28 LAB — CBC W/ AUTO DIFF
BASOPHILS ABSOLUTE COUNT: 0 10*9/L (ref 0.0–0.1)
BASOPHILS RELATIVE PERCENT: 0.1 %
EOSINOPHILS ABSOLUTE COUNT: 0 10*9/L (ref 0.0–0.5)
EOSINOPHILS RELATIVE PERCENT: 0 %
HEMATOCRIT: 38.6 % (ref 34.0–44.0)
HEMOGLOBIN: 13 g/dL (ref 11.3–14.9)
LYMPHOCYTES ABSOLUTE COUNT: 0.7 10*9/L — ABNORMAL LOW (ref 1.1–3.6)
LYMPHOCYTES RELATIVE PERCENT: 10.8 %
MEAN CORPUSCULAR HEMOGLOBIN CONC: 33.7 g/dL (ref 32.0–36.0)
MEAN CORPUSCULAR HEMOGLOBIN: 32.2 pg (ref 25.9–32.4)
MEAN CORPUSCULAR VOLUME: 95.4 fL (ref 77.6–95.7)
MEAN PLATELET VOLUME: 9 fL (ref 6.8–10.7)
MONOCYTES ABSOLUTE COUNT: 0.2 10*9/L — ABNORMAL LOW (ref 0.3–0.8)
MONOCYTES RELATIVE PERCENT: 3 %
NEUTROPHILS ABSOLUTE COUNT: 5.7 10*9/L (ref 1.8–7.8)
NEUTROPHILS RELATIVE PERCENT: 86.1 %
PLATELET COUNT: 205 10*9/L (ref 150–450)
RED BLOOD CELL COUNT: 4.04 10*12/L (ref 3.95–5.13)
RED CELL DISTRIBUTION WIDTH: 17.1 % — ABNORMAL HIGH (ref 12.2–15.2)
WBC ADJUSTED: 6.6 10*9/L (ref 3.6–11.2)

## 2023-11-28 LAB — COMPREHENSIVE METABOLIC PANEL
ALBUMIN: 4.2 g/dL (ref 3.4–5.0)
ALKALINE PHOSPHATASE: 198 U/L — ABNORMAL HIGH (ref 46–116)
ALT (SGPT): 455 U/L — ABNORMAL HIGH (ref 10–49)
ANION GAP: 10 mmol/L (ref 5–14)
AST (SGOT): 22 U/L (ref <=34)
BILIRUBIN TOTAL: 0.5 mg/dL (ref 0.3–1.2)
BLOOD UREA NITROGEN: 19 mg/dL (ref 9–23)
BUN / CREAT RATIO: 39
CALCIUM: 10.9 mg/dL — ABNORMAL HIGH (ref 8.7–10.4)
CHLORIDE: 100 mmol/L (ref 98–107)
CO2: 28 mmol/L (ref 20.0–31.0)
CREATININE: 0.49 mg/dL — ABNORMAL LOW (ref 0.55–1.02)
EGFR CKD-EPI (2021) FEMALE: >90 mL/min/1.73m2 (ref >=60)
GLUCOSE RANDOM: 259 mg/dL — ABNORMAL HIGH (ref 70–179)
POTASSIUM: 4.5 mmol/L (ref 3.4–4.8)
PROTEIN TOTAL: 7.6 g/dL (ref 5.7–8.2)
SODIUM: 138 mmol/L (ref 135–145)

## 2023-11-28 LAB — PHOSPHORUS: PHOSPHORUS: 3.9 mg/dL (ref 2.4–5.1)

## 2023-11-28 LAB — PROTIME-INR
INR: 1.07
PROTIME: 12.2 s (ref 9.9–12.6)

## 2023-11-28 MED ADMIN — insulin lispro (HumaLOG) injection 0-6 Units: 0-6 [IU] | SUBCUTANEOUS | @ 14:00:00

## 2023-11-28 MED ADMIN — diclofenac sodium (VOLTAREN) 1 % gel 4 g: 4 g | TOPICAL | @ 01:00:00

## 2023-11-28 MED ADMIN — insulin lispro (HumaLOG) injection 0-6 Units: 0-6 [IU] | SUBCUTANEOUS | @ 22:00:00

## 2023-11-28 MED ADMIN — dexAMETHasone (DECADRON) 4 mg/mL injection 4 mg: 4 mg | INTRAVENOUS | @ 22:00:00

## 2023-11-28 MED ADMIN — insulin NPH (HumuLIN,NovoLIN) injection 7 Units: 7 [IU] | SUBCUTANEOUS | @ 14:00:00 | Stop: 2023-11-28

## 2023-11-28 MED ADMIN — valACYclovir (VALTREX) tablet 500 mg: 500 mg | ORAL | @ 01:00:00 | Stop: 2023-12-24

## 2023-11-28 MED ADMIN — insulin NPH (HumuLIN,NovoLIN) injection 7 Units: 7 [IU] | SUBCUTANEOUS | @ 01:00:00

## 2023-11-28 MED ADMIN — dexAMETHasone (DECADRON) 4 mg/mL injection 4 mg: 4 mg | INTRAVENOUS | @ 04:00:00

## 2023-11-28 MED ADMIN — insulin lispro (HumaLOG) injection 3 Units: 3 [IU] | SUBCUTANEOUS | @ 22:00:00

## 2023-11-28 MED ADMIN — diclofenac sodium (VOLTAREN) 1 % gel 4 g: 4 g | TOPICAL | @ 10:00:00

## 2023-11-28 MED ADMIN — dexAMETHasone (DECADRON) 4 mg/mL injection 4 mg: 4 mg | INTRAVENOUS | @ 10:00:00

## 2023-11-28 MED ADMIN — metoclopramide (REGLAN) injection 10 mg: 10 mg | INTRAVENOUS | @ 15:00:00

## 2023-11-28 MED ADMIN — escitalopram oxalate (LEXAPRO) tablet 10 mg: 10 mg | ORAL | @ 14:00:00 | Stop: 2023-12-25

## 2023-11-28 MED ADMIN — losartan (COZAAR) tablet 25 mg: 25 mg | ORAL | @ 14:00:00

## 2023-11-28 MED ADMIN — diphenhydrAMINE (BENADRYL) injection: 25 mg | INTRAVENOUS | @ 15:00:00

## 2023-11-28 MED ADMIN — insulin lispro (HumaLOG) injection 8 Units: 8 [IU] | SUBCUTANEOUS | @ 01:00:00 | Stop: 2023-11-27

## 2023-11-28 MED ADMIN — cetirizine (ZYRTEC) tablet 10 mg: 10 mg | ORAL | @ 14:00:00

## 2023-11-28 MED ADMIN — letermovir (PREVYMIS) tablet 480 mg: 480 mg | ORAL | @ 01:00:00 | Stop: 2023-12-25

## 2023-11-28 MED ADMIN — dexAMETHasone (DECADRON) 4 mg/mL injection 4 mg: 4 mg | INTRAVENOUS | @ 15:00:00

## 2023-11-28 MED ADMIN — insulin lispro (HumaLOG) injection 0-6 Units: 0-6 [IU] | SUBCUTANEOUS | @ 15:00:00

## 2023-11-28 MED ADMIN — dapsone tablet 100 mg: 100 mg | ORAL | @ 14:00:00 | Stop: 2023-12-25

## 2023-11-28 NOTE — Unmapped (Signed)
 Patient's pain level was much improved from yesterday, requiring pain meds only once this shift. Her NPH insulin was increased from 7 to 10 units and 3 units of SSI was added with each meal in addition to the correctional. She remains on IV steroids, and had no complaints on nausea today. A BM Bx is looking to scheduled at some point this week. Her BP remains elevated up to 150/109. All other vitals are stable.       Problem: Adult Inpatient Plan of Care  Goal: Plan of Care Review  Outcome: Ongoing - Unchanged  Goal: Patient-Specific Goal (Individualized)  Outcome: Ongoing - Unchanged  Goal: Absence of Hospital-Acquired Illness or Injury  Outcome: Ongoing - Unchanged  Intervention: Identify and Manage Fall Risk  Recent Flowsheet Documentation  Taken 11/28/2023 0720 by Olena Bernard, RN  Safety Interventions:   chemotherapeutic agent precautions   infection management   isolation precautions   low bed   neutropenic precautions   nonskid shoes/slippers when out of bed  Intervention: Prevent Skin Injury  Recent Flowsheet Documentation  Taken 11/28/2023 0720 by Olena Bernard, RN  Positioning for Skin: Supine/Back  Intervention: Prevent Infection  Recent Flowsheet Documentation  Taken 11/28/2023 0720 by Olena Bernard, RN  Infection Prevention:   cohorting utilized   environmental surveillance performed   hand hygiene promoted   equipment surfaces disinfected   personal protective equipment utilized   rest/sleep promoted   single patient room provided   visitors restricted/screened  Goal: Optimal Comfort and Wellbeing  Outcome: Ongoing - Unchanged  Goal: Readiness for Transition of Care  Outcome: Ongoing - Unchanged  Goal: Rounds/Family Conference  Outcome: Ongoing - Unchanged     Problem: Fall Injury Risk  Goal: Absence of Fall and Fall-Related Injury  Outcome: Ongoing - Unchanged  Intervention: Promote Injury-Free Environment  Recent Flowsheet Documentation  Taken 11/28/2023 0720 by Olena Bernard, RN  Safety Interventions:   chemotherapeutic agent precautions   infection management   isolation precautions   low bed   neutropenic precautions   nonskid shoes/slippers when out of bed     Problem: Comorbidity Management  Goal: Maintenance of Asthma Control  Outcome: Ongoing - Unchanged  Goal: Maintenance of Behavioral Health Symptom Control  Outcome: Ongoing - Unchanged  Goal: Maintenance of COPD Symptom Control  Outcome: Ongoing - Unchanged  Goal: Blood Glucose Levels Within Targeted Range  Outcome: Ongoing - Unchanged  Goal: Maintenance of Heart Failure Symptom Control  Outcome: Ongoing - Unchanged  Goal: Blood Pressure in Desired Range  Outcome: Ongoing - Unchanged  Goal: Maintenance of Osteoarthritis Symptom Control  Outcome: Ongoing - Unchanged  Goal: Bariatric Home Regimen Maintained  Outcome: Ongoing - Unchanged  Intervention: Maintain and Manage Postbariatric Surgery Care  Recent Flowsheet Documentation  Taken 11/28/2023 0720 by Olena Bernard, RN  Oral Nutrition Promotion:   physical activity promoted   rest periods promoted  Goal: Maintenance of Seizure Control  Outcome: Ongoing - Unchanged     Problem: Pain Acute  Goal: Optimal Pain Control and Function  Outcome: Ongoing - Unchanged

## 2023-11-28 NOTE — Unmapped (Signed)
 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 female with past medical history including ph+ ALL in remission who was admitted for headache found to have MRI findings cf. myelitis.    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 Patient presenting in the setting of a weeks-long history of bilateral worsening headaches radiating up the neck/around the temples, vision changes including blurrier vision, and seeing halos around lights not always coinciding with her headaches. Improved with dexamethasone . No weakness/numbness, CNs grossly intact, spine showing T2 Flair hyperintensities c/f Leukemic involvement given history of Ph+ ALL as below. S/p LP 4/27 with cytology pending, however preliminary CSF analysis showing 1567 nucleated cells/uL, initial estimation of 70-80% blasts (final report pending). Will require 2x weekly chemo inpatient until clearance. Next LP with IT MTX on 5/1 (Mon/Thurs). Bone marrow biopsy recommend to patient for restaging disease, however patient declines at this time.  Possible BM biopsy; patient declines at this time  Mon/Thurs schedule for LP with IT MTX. Next due 5/1. S/p IT MTX first treatment 4/29  Contact Med M day prior to LP  S/p diagnostic Lumbar Puncture 4/27; opening pressure 31 mmHg; 1567 nucleated cells/uL; 70-80% blasts (final read pending)  Headache: Continue dexamethasone  4mg  q6h  PRN metoclopramide & PRN diphenhydramine   Avoid oxycodone  due to rebound headaches  HOLD ponatanib while awaiting liver recovery (restart when <3xULN ALT)  Continue home valtrex , prevymis     Hypertension  History of chronic hypertension, home dosage of Losartan  50 mg. During admission has sustained elevated systolics and diastolics in the setting of self dc of home losartan  due to her liver injury. Given low suspicion for losartan  hepatotixicty as driver of pts liver injury will plan to resume home losartan .   Continue PO Losartan  25 mg (half home dose) up titrate as needed  Continue Labetalol PRN SBP>180 or DBP>120     Acute Liver Injury - C/f Tylenol  Toxicity  Labs 4/22 with ALT 1663 up from 56 and AST 752 up from 27, mild ALP elevation thus consistent with hepatocellular injury. Differential includes DILI, shock liver, autoimmune hepatitis, acute hepatitis. Patient also has history of CMV viremia however CMV PCR was negative at last appointment 4/22. Liver doppler 4/26 without signs of veno-occlusive leukemic infiltration or portal vein thrombosis. Tylenol  discontinued 4/22. NAC trialed on 4/26, however discontinued due to a reaction involving acute flushing and difficulty breathing. Labs improving and will continue to trend.  Hepatology consulted, appreciate recs  Follow-up acute hepatitis panel, HIV  Trend hepatic function panel   Trend PT-INR, CBC/Diff, CMP/Mg/Phos  Continue home ppx with valtrex , letermovir     T2DM  Home glargine 10U. POC glucose 413 on 4/27, started patient on a carbohydrate controlled diet (45/45/45) with additional high protein-low GI protein supplements. Started on AC insulin due to dexamethasone  given for headaches.  NPH 10u BID  Increased SSI to SF 30  Lispro 3u AC TID    Chronic Problems  HLD: HOLD rosuvastatin  10 mg nightly     Impending Electrolyte Abnormality  Secondary to Chemotherapy and/or IV Fluids  -Daily Electrolyte monitoring  -Replete per [facility] guidelines.     Immunocompromised status: Patient is immunocompromised secondary to disease and chemotherapy  -Antimicrobial prophylaxis as above    Impending Pancytopenia secondary to Acute Leukemia and chemotherapy:   - Transfuse hgb <7  - Transfuse plt <10K    Nutrition:                        Subjective:     Interval History: [name]. VSS. Headaches much improved with dexamethasone . They start to resurge as dexa levels taper off. Adamant against a BM biopsy.    10 point ROS otherwise negative except as above in the HPI.     Objective:     Vital signs in last 24 hours:  Temp:  [36.4 ??C (97.5 ??F)-37.1 ??C (98.7 ??F)] 36.7 ??C (98 ??F)  Pulse:  [83-108] 105  Resp:  [17-20] 20  BP: (138-158)/(92-113) 150/109  MAP (mmHg):  [107-124] 121  SpO2:  [94 %-100 %] 100 %    Intake/Output last 3 shifts:  I/O last 3 completed shifts:  In: 237 [P.O.:237]  Out: -     Meds:  Current Facility-Administered Medications   Medication Dose Route Frequency Provider Last Rate Last Admin    albuterol  (PROVENTIL  HFA;VENTOLIN  HFA) 90 mcg/actuation inhaler 2 puff  2 puff Inhalation Q6H PRN Daphene Dys, MD        bismuth  subsalicylate (PEPTO-BISMOL) oral suspension  30 mL Oral Q6H PRN Rudloff, Michael W, MD   30 mL at 11/25/23 1648    cetirizine  (ZYRTEC ) tablet 10 mg  10 mg Oral Daily Rudloff, Michael W, MD   10 mg at 11/28/23 1014    CHEMO CLARIFICATION ORDER   Other Continuous PRN Stacey Dyke, MD        dapsone  tablet 100 mg  100 mg Oral Daily Daphene Dys, MD   100 mg at 11/28/23 1014    dexAMETHasone  (DECADRON ) 4 mg/mL injection 4 mg  4 mg Intravenous Q6H SCH Rudloff, Michael W, MD   4 mg at 11/28/23 1114    dextrose  50 % in water (D50W) 50 % solution 12.5 g  12.5 g Intravenous Q15 Min PRN Cookmeyer, Helga Loan, MD        diclofenac  sodium (VOLTAREN ) 1 % gel 4 g  4 g Topical QID Loletha Ripper, MD   4 g at 11/28/23 0865    diphenhydrAMINE  (BENADRYL ) injection  25 mg Intravenous Q6H PRN Cornett, Fletcher Humble, MD   25 mg at 11/28/23 1102    [Provider Hold] enoxaparin (LOVENOX) syringe 40 mg  40 mg Subcutaneous Q24H Cookmeyer, Helga Loan, MD        escitalopram  oxalate (LEXAPRO ) tablet 10 mg  10 mg Oral Daily Daphene Dys, MD   10 mg at 11/28/23 1014    glucagon injection 1 mg  1 mg Intramuscular Once PRN Cookmeyer, Helga Loan, MD        glucose chewable tablet 16 g  16 g Oral Q10 Min PRN Cookmeyer, Helga Loan, MD        insulin lispro (HumaLOG) injection 0-6 Units  0-6 Units Subcutaneous ACHS Loletha Ripper, MD   6 Units at 11/28/23 1114    insulin NPH (HumuLIN,NovoLIN) injection 10 Units  10 Units Subcutaneous Q12H SCH Johnisha Louks C, DO        IP OKAY TO TREAT   Other Continuous PRN Stacey Dyke,  MD        labetalol (NORMODYNE) injection  10 mg Intravenous Q6H PRN Teressa Ferdinand, MD   10 mg at 11/26/23 0033    letermovir  (PREVYMIS ) tablet 480 mg  480 mg Oral Daily Daphene Dys, MD   480 mg at 11/27/23 2125    lidocaine  (XYLOCAINE ) 20 mg/mL (2 %) injection 20 mL  20 mL Intradermal Once Bhatta, Manasa R, MD        losartan  (COZAAR ) tablet 25 mg  25 mg Oral Daily Rudloff, Michael W, MD   25 mg at 11/28/23 1014    metoclopramide (REGLAN) injection 10 mg  10 mg Intravenous Q6H PRN Cornett, Fletcher Humble, MD   10 mg at 11/28/23 1102    oxyCODONE  (ROXICODONE ) immediate release tablet 5 mg  5 mg Oral Q4H PRN Cookmeyer, Helga Loan, MD        senna (SENOKOT) tablet 2 tablet  2 tablet Oral Nightly PRN Cornett, Fletcher Humble, MD        Or    polyethylene glycol (MIRALAX ) packet 17 g  17 g Oral Daily PRN Cornett, Fletcher Humble, MD        prochlorperazine  (COMPAZINE ) tablet 10 mg  10 mg Oral Q6H PRN Avery, Natalia, DO        Or    prochlorperazine  (COMPAZINE ) injection 10 mg  10 mg Intravenous Q6H PRN Avery, Natalia, DO   10 mg at 11/27/23 1847    valACYclovir  (VALTREX ) tablet 500 mg  500 mg Oral Daily Teressa Ferdinand, MD   500 mg at 11/27/23 2125       Physical Exam:  General: Resting, in no apparent distress, lying in bed  HEENT:  PERRL. EOMI. Normocephalic, atraumatic  Heart:  RRR.   Lungs:  Normal WOB on room air. CTAB.  Abdomen:  No distention or pain on palpation.    Skin:  No lesions on clothed exam.  Psychiatric:  Range of affect is appropriate.    Neurologic:  Alert and oriented to person, place, time and situation.  CNII-CNXII grossly intact.  Extremities:  Appear well-perfused. No clubbing, edema, or cyanosis.      Labs:  Recent Labs     11/26/23  0013 11/27/23  0238 11/28/23  0622   WBC 8.9 8.3 6.6   NEUTROABS 6.5 6.2 5.7   LYMPHSABS 1.4 1.5 0.7*   HGB 12.3 12.3 13.0   HCT 36.3 36.2 38.6   PLT 181 182 205   CREATININE 0.45* 0.45* 0.49*   BUN 11 13 19    BILITOT 0.6 0.6 0.5   AST 48* 29 22   ALT 814* 599* 455*   ALKPHOS 227* 204* 198*   K 3.7 3.9 4.5   MG 2.3 2.3 2.1   CALCIUM  10.3 10.2 10.9*   NA 138 140 138   CL 101 103 100   CO2 25.0 29.0 28.0   PHOS 2.7 3.1 3.9   INR 1.04 1.06 1.07       Imaging:  No new.      Abner Hoffman, [facility]  11/28/2023  12:22 PM   Hematology/Oncology Department   [facility]   Group pager: [phone]     I attest that I have reviewed the medical student note and that the components of the history of the present illness, the physical exam, and the assessment and plan documented were performed by me or were performed in my presence by the student where I verified the documentation and performed (or  re-performed) the exam and medical decision making.     Dareen Ebbing, DO  PGY-1   [facility]

## 2023-11-28 NOTE — Unmapped (Signed)
 No acute events. Elevated BP consistent with BP trend, MD notified. Elevated POCT BG. MD notified. Asymptomatic to both. Free from falls or injury. Bed in low and locked position.   Problem: Adult Inpatient Plan of Care  Goal: Plan of Care Review  Outcome: Progressing  Goal: Patient-Specific Goal (Individualized)  Outcome: Progressing  Goal: Absence of Hospital-Acquired Illness or Injury  Outcome: Progressing  Intervention: Identify and Manage Fall Risk  Recent Flowsheet Documentation  Taken 11/27/2023 2000 by Markham Silence, RN  Safety Interventions:   low bed   lighting adjusted for tasks/safety   fall reduction program maintained  Goal: Optimal Comfort and Wellbeing  Outcome: Progressing  Goal: Readiness for Transition of Care  Outcome: Progressing  Goal: Rounds/Family Conference  Outcome: Progressing     Problem: Fall Injury Risk  Goal: Absence of Fall and Fall-Related Injury  Outcome: Progressing  Intervention: Promote Scientist, clinical (histocompatibility and immunogenetics) Documentation  Taken 11/27/2023 2000 by Markham Silence, RN  Safety Interventions:   low bed   lighting adjusted for tasks/safety   fall reduction program maintained     Problem: Comorbidity Management  Goal: Maintenance of Asthma Control  Outcome: Progressing  Goal: Maintenance of Behavioral Health Symptom Control  Outcome: Progressing  Goal: Maintenance of COPD Symptom Control  Outcome: Progressing  Goal: Blood Glucose Levels Within Targeted Range  Outcome: Progressing  Goal: Maintenance of Heart Failure Symptom Control  Outcome: Progressing  Goal: Blood Pressure in Desired Range  Outcome: Progressing  Goal: Maintenance of Osteoarthritis Symptom Control  Outcome: Progressing  Goal: Bariatric Home Regimen Maintained  Outcome: Progressing  Goal: Maintenance of Seizure Control  Outcome: Progressing     Problem: Pain Acute  Goal: Optimal Pain Control and Function  Outcome: Progressing

## 2023-11-28 NOTE — Unmapped (Signed)
 Medicine Procedure Service Follow-up Consult Note    Assessment/Recommendations:    Patricia [name] is a 48 y.o. 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    The Medicine Procedure Service was consulted for lumbar puncture, which was performed initially uneventfully at the bedside on 4/27. With evidence of CNS malignancy, we repeated procedure on 4/28 with administration of intrathecal chemotherapy.    No procedural complications identified.    Thank you for involving us  in the care of your patient.  We will sign off for now, but please page us  at [phone] with questions/concerns, or if we may be of further assistance.    ___________________________________________________________________    Subjective:  Some headache that came on after getting up to go to the bathroom, but no definite positional quality. No new neuro symptoms.    Labs/Studies:  Labs and Studies from the last 24hrs per EMR and Reviewed  CSF with blasts present, protein 43    Objective:  Temp:  [36.4 ??C (97.5 ??F)-37.1 ??C (98.7 ??F)] 36.8 ??C (98.2 ??F)  Pulse:  [83-108] 108  Resp:  [17-20] 20  BP: (138-158)/(92-113) 146/92  SpO2:  [94 %-99 %] 99 %    GEN: Lying in bed in NAD  NEURO: Symmetric strength in LE's and SILT  SKIN: LP site with minimal tenderness and no erythema or ecchymosis

## 2023-11-29 DIAGNOSIS — C9100 Acute lymphoblastic leukemia not having achieved remission: Principal | ICD-10-CM

## 2023-11-29 LAB — CBC W/ AUTO DIFF
BASOPHILS ABSOLUTE COUNT: 0 10*9/L (ref 0.0–0.1)
BASOPHILS RELATIVE PERCENT: 0 %
EOSINOPHILS ABSOLUTE COUNT: 0 10*9/L (ref 0.0–0.5)
EOSINOPHILS RELATIVE PERCENT: 0 %
HEMATOCRIT: 36 % (ref 34.0–44.0)
HEMOGLOBIN: 12.5 g/dL (ref 11.3–14.9)
LYMPHOCYTES ABSOLUTE COUNT: 0.9 10*9/L — ABNORMAL LOW (ref 1.1–3.6)
LYMPHOCYTES RELATIVE PERCENT: 6.5 %
MEAN CORPUSCULAR HEMOGLOBIN CONC: 34.6 g/dL (ref 32.0–36.0)
MEAN CORPUSCULAR HEMOGLOBIN: 32.5 pg — ABNORMAL HIGH (ref 25.9–32.4)
MEAN CORPUSCULAR VOLUME: 93.9 fL (ref 77.6–95.7)
MEAN PLATELET VOLUME: 8.8 fL (ref 6.8–10.7)
MONOCYTES ABSOLUTE COUNT: 0.4 10*9/L (ref 0.3–0.8)
MONOCYTES RELATIVE PERCENT: 3 %
NEUTROPHILS ABSOLUTE COUNT: 12.1 10*9/L — ABNORMAL HIGH (ref 1.8–7.8)
NEUTROPHILS RELATIVE PERCENT: 90.5 %
PLATELET COUNT: 243 10*9/L (ref 150–450)
RED BLOOD CELL COUNT: 3.84 10*12/L — ABNORMAL LOW (ref 3.95–5.13)
RED CELL DISTRIBUTION WIDTH: 17.1 % — ABNORMAL HIGH (ref 12.2–15.2)
WBC ADJUSTED: 13.4 10*9/L — ABNORMAL HIGH (ref 3.6–11.2)

## 2023-11-29 LAB — COMPREHENSIVE METABOLIC PANEL
ALBUMIN: 4.2 g/dL (ref 3.4–5.0)
ALKALINE PHOSPHATASE: 173 U/L — ABNORMAL HIGH (ref 46–116)
ALT (SGPT): 335 U/L — ABNORMAL HIGH (ref 10–49)
ANION GAP: 11 mmol/L (ref 5–14)
AST (SGOT): 19 U/L (ref <=34)
BILIRUBIN TOTAL: 0.5 mg/dL (ref 0.3–1.2)
BLOOD UREA NITROGEN: 29 mg/dL — ABNORMAL HIGH (ref 9–23)
BUN / CREAT RATIO: 51
CALCIUM: 10.4 mg/dL (ref 8.7–10.4)
CHLORIDE: 101 mmol/L (ref 98–107)
CO2: 27 mmol/L (ref 20.0–31.0)
CREATININE: 0.57 mg/dL (ref 0.55–1.02)
EGFR CKD-EPI (2021) FEMALE: >90 mL/min/1.73m2 (ref >=60)
GLUCOSE RANDOM: 223 mg/dL — ABNORMAL HIGH (ref 70–179)
POTASSIUM: 4.2 mmol/L (ref 3.4–4.8)
PROTEIN TOTAL: 7.5 g/dL (ref 5.7–8.2)
SODIUM: 139 mmol/L (ref 135–145)

## 2023-11-29 LAB — MAGNESIUM: MAGNESIUM: 2 mg/dL (ref 1.6–2.6)

## 2023-11-29 LAB — PHOSPHORUS: PHOSPHORUS: 4.4 mg/dL (ref 2.4–5.1)

## 2023-11-29 LAB — PROTIME-INR
INR: 1
PROTIME: 11.4 s (ref 9.9–12.6)

## 2023-11-29 MED ADMIN — insulin lispro (HumaLOG) injection 6 Units: 6 [IU] | SUBCUTANEOUS | @ 16:00:00

## 2023-11-29 MED ADMIN — insulin lispro (HumaLOG) injection 0-6 Units: 0-6 [IU] | SUBCUTANEOUS | @ 02:00:00

## 2023-11-29 MED ADMIN — labetalol (NORMODYNE) injection: 10 mg | INTRAVENOUS | @ 21:00:00

## 2023-11-29 MED ADMIN — losartan (COZAAR) tablet 25 mg: 25 mg | ORAL | @ 13:00:00

## 2023-11-29 MED ADMIN — dexAMETHasone (DECADRON) 4 mg/mL injection 4 mg: 4 mg | INTRAVENOUS | @ 23:00:00

## 2023-11-29 MED ADMIN — dexAMETHasone (DECADRON) 4 mg/mL injection 4 mg: 4 mg | INTRAVENOUS | @ 11:00:00

## 2023-11-29 MED ADMIN — insulin NPH (HumuLIN,NovoLIN) injection 12 Units: 12 [IU] | SUBCUTANEOUS | @ 13:00:00 | Stop: 2023-11-29

## 2023-11-29 MED ADMIN — insulin lispro (HumaLOG) injection 0-6 Units: 0-6 [IU] | SUBCUTANEOUS | @ 13:00:00

## 2023-11-29 MED ADMIN — cetirizine (ZYRTEC) tablet 10 mg: 10 mg | ORAL | @ 13:00:00

## 2023-11-29 MED ADMIN — insulin lispro (HumaLOG) injection 0-6 Units: 0-6 [IU] | SUBCUTANEOUS | @ 21:00:00

## 2023-11-29 MED ADMIN — dexAMETHasone (DECADRON) 4 mg/mL injection 4 mg: 4 mg | INTRAVENOUS | @ 04:00:00

## 2023-11-29 MED ADMIN — cyclobenzaprine (FLEXERIL) tablet 10 mg: 10 mg | ORAL | @ 21:00:00

## 2023-11-29 MED ADMIN — metoclopramide (REGLAN) injection 10 mg: 10 mg | INTRAVENOUS | @ 15:00:00

## 2023-11-29 MED ADMIN — valACYclovir (VALTREX) tablet 500 mg: 500 mg | ORAL | @ 02:00:00 | Stop: 2023-12-24

## 2023-11-29 MED ADMIN — escitalopram oxalate (LEXAPRO) tablet 10 mg: 10 mg | ORAL | @ 13:00:00 | Stop: 2023-12-25

## 2023-11-29 MED ADMIN — insulin NPH (HumuLIN,NovoLIN) injection 12 Units: 12 [IU] | SUBCUTANEOUS | @ 02:00:00

## 2023-11-29 MED ADMIN — dapsone tablet 100 mg: 100 mg | ORAL | @ 13:00:00 | Stop: 2023-12-25

## 2023-11-29 MED ADMIN — metoclopramide (REGLAN) injection 10 mg: 10 mg | INTRAVENOUS | @ 03:00:00

## 2023-11-29 MED ADMIN — metoclopramide (REGLAN) injection 10 mg: 10 mg | INTRAVENOUS | @ 22:00:00

## 2023-11-29 MED ADMIN — letermovir (PREVYMIS) tablet 480 mg: 480 mg | ORAL | @ 02:00:00 | Stop: 2023-12-25

## 2023-11-29 MED ADMIN — diclofenac sodium (VOLTAREN) 1 % gel 4 g: 4 g | TOPICAL | @ 16:00:00

## 2023-11-29 MED ADMIN — insulin lispro (HumaLOG) injection 0-6 Units: 0-6 [IU] | SUBCUTANEOUS | @ 16:00:00

## 2023-11-29 MED ADMIN — diphenhydrAMINE (BENADRYL) injection: 25 mg | INTRAVENOUS | @ 09:00:00

## 2023-11-29 MED ADMIN — insulin lispro (HumaLOG) injection 6 Units: 6 [IU] | SUBCUTANEOUS | @ 21:00:00

## 2023-11-29 MED ADMIN — metoclopramide (REGLAN) injection 10 mg: 10 mg | INTRAVENOUS | @ 09:00:00

## 2023-11-29 MED ADMIN — dexAMETHasone (DECADRON) 4 mg/mL injection 4 mg: 4 mg | INTRAVENOUS | @ 17:00:00

## 2023-11-29 MED ADMIN — diphenhydrAMINE (BENADRYL) injection: 25 mg | INTRAVENOUS | @ 22:00:00

## 2023-11-29 MED ADMIN — insulin lispro (HumaLOG) injection 3 Units: 3 [IU] | SUBCUTANEOUS | @ 13:00:00 | Stop: 2023-11-29

## 2023-11-29 MED ADMIN — diphenhydrAMINE (BENADRYL) injection: 25 mg | INTRAVENOUS | @ 15:00:00

## 2023-11-29 MED ADMIN — diphenhydrAMINE (BENADRYL) injection: 25 mg | INTRAVENOUS | @ 03:00:00

## 2023-11-29 NOTE — Unmapped (Signed)
 Pt afebrile during shift, remains hypertensive, did not meet parameters for intervention, all other VSS. Pt given benadryl /Reglan for headache. Pt denies n/v/d. Pt free from falls/injuries, call bell within reach.     Problem: Adult Inpatient Plan of Care  Goal: Plan of Care Review  Outcome: Ongoing - Unchanged  Goal: Patient-Specific Goal (Individualized)  Outcome: Ongoing - Unchanged  Goal: Absence of Hospital-Acquired Illness or Injury  Outcome: Ongoing - Unchanged  Intervention: Identify and Manage Fall Risk  Recent Flowsheet Documentation  Taken 11/28/2023 1922 by Katrine Parody, RN  Safety Interventions:   infection management   isolation precautions   lighting adjusted for tasks/safety   low bed   nonskid shoes/slippers when out of bed   neutropenic precautions   bleeding precautions  Intervention: Prevent Skin Injury  Recent Flowsheet Documentation  Taken 11/28/2023 1922 by Katrine Parody, RN  Skin Protection:   adhesive use limited   tubing/devices free from skin contact   transparent dressing maintained  Intervention: Prevent Infection  Recent Flowsheet Documentation  Taken 11/28/2023 1922 by Katrine Parody, RN  Infection Prevention:   hand hygiene promoted   personal protective equipment utilized   rest/sleep promoted   single patient room provided   visitors restricted/screened  Goal: Optimal Comfort and Wellbeing  Outcome: Ongoing - Unchanged  Goal: Readiness for Transition of Care  Outcome: Ongoing - Unchanged  Goal: Rounds/Family Conference  Outcome: Ongoing - Unchanged     Problem: Fall Injury Risk  Goal: Absence of Fall and Fall-Related Injury  Outcome: Ongoing - Unchanged  Intervention: Promote Injury-Free Environment  Recent Flowsheet Documentation  Taken 11/28/2023 1922 by Katrine Parody, RN  Safety Interventions:   infection management   isolation precautions   lighting adjusted for tasks/safety   low bed   nonskid shoes/slippers when out of bed   neutropenic precautions   bleeding precautions     Problem: Comorbidity Management  Goal: Maintenance of Asthma Control  Outcome: Ongoing - Unchanged  Goal: Maintenance of Behavioral Health Symptom Control  Outcome: Ongoing - Unchanged  Goal: Maintenance of COPD Symptom Control  Outcome: Ongoing - Unchanged  Goal: Blood Glucose Levels Within Targeted Range  Outcome: Ongoing - Unchanged  Goal: Maintenance of Heart Failure Symptom Control  Outcome: Ongoing - Unchanged  Goal: Blood Pressure in Desired Range  Outcome: Ongoing - Unchanged  Goal: Maintenance of Osteoarthritis Symptom Control  Outcome: Ongoing - Unchanged  Intervention: Maintain Osteoarthritis Symptom Control  Recent Flowsheet Documentation  Taken 11/28/2023 1922 by Katrine Parody, RN  Activity Management: up ad lib  Goal: Bariatric Home Regimen Maintained  Outcome: Ongoing - Unchanged  Goal: Maintenance of Seizure Control  Outcome: Ongoing - Unchanged     Problem: Pain Acute  Goal: Optimal Pain Control and Function  Outcome: Ongoing - Unchanged

## 2023-11-29 NOTE — Unmapped (Signed)
 Daily Progress Note      Principal Problem:    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)  Active Problems:    Blurred vision, bilateral    Hypertension    Headache    Metastasis to brain    Drug-induced injury of liver    Type 2 diabetes mellitus, with long-term current use of insulin    Hyperlipidemia       LOS: 5 days     Assessment/Plan:Patricia [name] is an 48 y.o. female with past medical history including ph+ ALL in remission who was admitted for headache found to have MRI findings cf. myelitis.    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 Patient presenting in the setting of a weeks-long history of bilateral worsening headaches radiating up the neck/around the temples, vision changes including blurrier vision, and seeing halos around lights not always coinciding with her headaches. Improved with dexamethasone . No weakness/numbness, CNs grossly intact, spine showing T2 Flair hyperintensities c/f Leukemic involvement given history of Ph+ ALL as below. Bone marrow biopsy recommend to patient for restaging disease, however patient declines at this time.  Possible BM biopsy; will discuss with her outpatient oncologist further today.   Mon/Thurs schedule for LP with IT MTX. Next due 5/1. S/p IT MTX first treatment 4/29.  S/p diagnostic Lumbar Puncture 4/27; opening pressure 31 mmHg; 1567 nucleated cells/uL; 70-80% blasts (final read pending)  Headache: Continue dexamethasone  4mg  q6h  PRN metoclopramide & PRN diphenhydramine   Avoid oxycodone  due to rebound headaches  HOLD ponatanib while awaiting liver recovery (restart when <3xULN ALT)  Continue home valtrex , prevymis     Hypertension  History of chronic hypertension, home dosage of Losartan  50 mg. During admission has sustained elevated systolics and diastolics in the setting of self dc of home losartan  due to her liver injury. Given low suspicion for losartan  hepatotixicty as driver of pts liver injury will plan to resume home losartan .   Continue PO Losartan  25 mg (half home dose) up titrate as needed  Continue Labetalol PRN SBP>180 or DBP>120     Acute Liver Injury - C/f Tylenol  Toxicity  Labs 4/22 with ALT 1663 up from 56 and AST 752 up from 27, mild ALP elevation thus consistent with hepatocellular injury. Differential includes DILI, shock liver, autoimmune hepatitis, acute hepatitis. Liver doppler 4/26 without signs of veno-occlusive leukemic infiltration or portal vein thrombosis. Tylenol  discontinued 4/22. NAC trialed on 4/26, however discontinued due to a reaction involving acute flushing and difficulty breathing. Labs improving and will continue to trend. Acute hepatitis panel negative. HIV negative.   Hepatology consulted, appreciate recs  Trend hepatic function panel   Trend PT-INR, CBC/Diff, CMP/Mg/Phos  Continue home ppx with valtrex , letermovir     T2DM  Home glargine 10U. POC glucose 413 on 4/27, started patient on a carbohydrate controlled diet (45/45/45) with additional high protein-low GI protein supplements. Started on AC insulin due to dexamethasone  given for headaches.  NPH 15u BID  Increased SSI to SF 30  Lispro 6u AC TID    Neck pain  CT of Cervical spine at OSH hospital showed degenerative disc disease at C5-C6 with no spinal cord compression. Patient states she has increased pain in her neck from lying in bed all day. It improves with movement. No numbness or tingling. Improves with voltaren  gel and cold packs. Will continue to monitor symptoms   - Voltaren  gel prn   - Ice Packs  Chronic Problems  HLD: HOLD rosuvastatin  10 mg nightly     Impending Electrolyte Abnormality Secondary to Chemotherapy and/or IV Fluids  -Daily Electrolyte monitoring  -Replete per [facility] guidelines.     Immunocompromised status: Patient is immunocompromised secondary to disease and chemotherapy  -Antimicrobial prophylaxis as above    Impending Pancytopenia secondary to Acute Leukemia and chemotherapy: - Transfuse hgb <7  - Transfuse plt <10K    Nutrition: Diabetic                        Subjective:     Interval History: [name]. Patient doing well this AM. States her headaches are improving but she is now having neck pain that is chronic. Voltaren  gel is helping to alleviate the pain. No other complaints at this time.     10 point ROS otherwise negative except as above in the HPI.     Objective:     Vital signs in last 24 hours:  Temp:  [36.5 ??C (97.7 ??F)-37 ??C (98.6 ??F)] 37 ??C (98.6 ??F)  Pulse:  [85-108] 85  Resp:  [20] 20  BP: (136-150)/(94-109) 141/94  MAP (mmHg):  [106-121] 109  SpO2:  [94 %-100 %] 96 %    Intake/Output last 3 shifts:  I/O last 3 completed shifts:  In: 1542 [P.O.:1542]  Out: -     Meds:  Current Facility-Administered Medications   Medication Dose Route Frequency Provider Last Rate Last Admin    albuterol  (PROVENTIL  HFA;VENTOLIN  HFA) 90 mcg/actuation inhaler 2 puff  2 puff Inhalation Q6H PRN Daphene Dys, MD        bismuth  subsalicylate (PEPTO-BISMOL) oral suspension  30 mL Oral Q6H PRN Rudloff, Michael W, MD   30 mL at 11/25/23 1648    cetirizine  (ZYRTEC ) tablet 10 mg  10 mg Oral Daily Rudloff, Michael W, MD   10 mg at 11/29/23 0841    CHEMO CLARIFICATION ORDER   Other Continuous PRN Stacey Dyke, MD        dapsone  tablet 100 mg  100 mg Oral Daily Daphene Dys, MD   100 mg at 11/29/23 0841    dexAMETHasone  (DECADRON ) 4 mg/mL injection 4 mg  4 mg Intravenous Q6H SCH Rudloff, Michael W, MD   4 mg at 11/29/23 1610    dextrose  50 % in water (D50W) 50 % solution 12.5 g  12.5 g Intravenous Q15 Min PRN Cookmeyer, Helga Loan, MD        diclofenac  sodium (VOLTAREN ) 1 % gel 4 g  4 g Topical QID Loletha Ripper, MD   4 g at 11/28/23 9604    diphenhydrAMINE  (BENADRYL ) injection  25 mg Intravenous Q6H PRN Cornett, Fletcher Humble, MD   25 mg at 11/29/23 5409    [Provider Hold] enoxaparin (LOVENOX) syringe 40 mg  40 mg Subcutaneous Q24H Cookmeyer, Helga Loan, MD        escitalopram  oxalate (LEXAPRO ) tablet 10 mg  10 mg Oral Daily Daphene Dys, MD   10 mg at 11/29/23 8119    glucagon injection 1 mg  1 mg Intramuscular Once PRN Cookmeyer, Helga Loan, MD        glucose chewable tablet 16 g  16 g Oral Q10 Min PRN Cookmeyer, Helga Loan, MD        insulin lispro (HumaLOG) injection 0-6 Units  0-6 Units Subcutaneous ACHS Loletha Ripper, MD   4 Units at 11/29/23 0842    insulin lispro (HumaLOG) injection 3  Units  3 Units Subcutaneous 3xd Meals Donjuan Robison C, DO   3 Units at 11/29/23 0841    insulin NPH (HumuLIN,NovoLIN) injection 12 Units  12 Units Subcutaneous Q12H [facility] Sybel Standish C, DO   12 Units at 11/29/23 2952    IP OKAY TO TREAT   Other Continuous PRN Stacey Dyke, MD        labetalol (NORMODYNE) injection  10 mg Intravenous Q6H PRN Teressa Ferdinand, MD   10 mg at 11/26/23 0033    letermovir  (PREVYMIS ) tablet 480 mg  480 mg Oral Daily Daphene Dys, MD   480 mg at 11/28/23 2141    lidocaine  (XYLOCAINE ) 20 mg/mL (2 %) injection 20 mL  20 mL Intradermal Once Bhatta, Manasa R, MD        losartan  (COZAAR ) tablet 25 mg  25 mg Oral Daily Rudloff, Michael W, MD   25 mg at 11/29/23 0841    metoclopramide (REGLAN) injection 10 mg  10 mg Intravenous Q6H PRN Cornett, Fletcher Humble, MD   10 mg at 11/29/23 8413    oxyCODONE  (ROXICODONE ) immediate release tablet 5 mg  5 mg Oral Q4H PRN Cookmeyer, Helga Loan, MD        senna (SENOKOT) tablet 2 tablet  2 tablet Oral Nightly PRN Cornett, Fletcher Humble, MD        Or    polyethylene glycol (MIRALAX ) packet 17 g  17 g Oral Daily PRN Cornett, Fletcher Humble, MD        prochlorperazine  (COMPAZINE ) tablet 10 mg  10 mg Oral Q6H PRN Avery, Natalia, DO        Or    prochlorperazine  (COMPAZINE ) injection 10 mg  10 mg Intravenous Q6H PRN Avery, Natalia, DO   10 mg at 11/27/23 1847    valACYclovir  (VALTREX ) tablet 500 mg  500 mg Oral Daily Teressa Ferdinand, MD   500 mg at 11/28/23 2141       Physical Exam:  General: Resting, in no apparent distress, lying in bed  HEENT:  PERRL. EOMI. Normocephalic, atraumatic  Heart:  RRR.   Lungs:  Normal WOB on room air. CTAB.  Abdomen:  No distention or pain on palpation.    Skin:  No lesions on clothed exam.  Psychiatric:  Range of affect is appropriate.    Neurologic:  Alert and oriented to person, place, time and situation.  CNII-CNXII grossly intact.  Extremities:  Appear well-perfused. No clubbing, edema, or cyanosis.      Labs:  Recent Labs     11/27/23  0238 11/28/23  0622 11/29/23  0520   WBC 8.3 6.6 13.4*   NEUTROABS 6.2 5.7 12.1*   LYMPHSABS 1.5 0.7* 0.9*   HGB 12.3 13.0 12.5   HCT 36.2 38.6 36.0   PLT 182 205 243   CREATININE 0.45* 0.49* 0.57   BUN 13 19 29*   BILITOT 0.6 0.5 0.5   AST 29 22 19    ALT 599* 455* 335*   ALKPHOS 204* 198* 173*   K 3.9 4.5 4.2   MG 2.3 2.1 2.0   CALCIUM  10.2 10.9* 10.4   NA 140 138 139   CL 103 100 101   CO2 29.0 28.0 27.0   PHOS 3.1 3.9 4.4   INR 1.06 1.07 1.00       Imaging:  No new.      Arlon Bergamo, DO,  11/29/2023  8:48 AM   Hematology/Oncology Department   [facility]  Group pager: [phone]

## 2023-11-30 DIAGNOSIS — C9100 Acute lymphoblastic leukemia not having achieved remission: Principal | ICD-10-CM

## 2023-11-30 LAB — CBC W/ AUTO DIFF
BASOPHILS ABSOLUTE COUNT: 0 10*9/L (ref 0.0–0.1)
BASOPHILS RELATIVE PERCENT: 0.3 %
EOSINOPHILS ABSOLUTE COUNT: 0 10*9/L (ref 0.0–0.5)
EOSINOPHILS RELATIVE PERCENT: 0 %
HEMATOCRIT: 35.5 % (ref 34.0–44.0)
HEMOGLOBIN: 11.8 g/dL (ref 11.3–14.9)
LYMPHOCYTES ABSOLUTE COUNT: 0.7 10*9/L — ABNORMAL LOW (ref 1.1–3.6)
LYMPHOCYTES RELATIVE PERCENT: 6.3 %
MEAN CORPUSCULAR HEMOGLOBIN CONC: 33.4 g/dL (ref 32.0–36.0)
MEAN CORPUSCULAR HEMOGLOBIN: 31.8 pg (ref 25.9–32.4)
MEAN CORPUSCULAR VOLUME: 95.4 fL (ref 77.6–95.7)
MEAN PLATELET VOLUME: 8.4 fL (ref 6.8–10.7)
MONOCYTES ABSOLUTE COUNT: 0.4 10*9/L (ref 0.3–0.8)
MONOCYTES RELATIVE PERCENT: 3.3 %
NEUTROPHILS ABSOLUTE COUNT: 9.9 10*9/L — ABNORMAL HIGH (ref 1.8–7.8)
NEUTROPHILS RELATIVE PERCENT: 90.1 %
PLATELET COUNT: 225 10*9/L (ref 150–450)
RED BLOOD CELL COUNT: 3.72 10*12/L — ABNORMAL LOW (ref 3.95–5.13)
RED CELL DISTRIBUTION WIDTH: 17.3 % — ABNORMAL HIGH (ref 12.2–15.2)
WBC ADJUSTED: 11 10*9/L (ref 3.6–11.2)

## 2023-11-30 LAB — COMPREHENSIVE METABOLIC PANEL
ALBUMIN: 4 g/dL (ref 3.4–5.0)
ALKALINE PHOSPHATASE: 148 U/L — ABNORMAL HIGH (ref 46–116)
ALT (SGPT): 252 U/L — ABNORMAL HIGH (ref 10–49)
ANION GAP: 9 mmol/L (ref 5–14)
AST (SGOT): 18 U/L (ref <=34)
BILIRUBIN TOTAL: 0.6 mg/dL (ref 0.3–1.2)
BLOOD UREA NITROGEN: 28 mg/dL — ABNORMAL HIGH (ref 9–23)
BUN / CREAT RATIO: 49
CALCIUM: 10.8 mg/dL — ABNORMAL HIGH (ref 8.7–10.4)
CHLORIDE: 102 mmol/L (ref 98–107)
CO2: 28 mmol/L (ref 20.0–31.0)
CREATININE: 0.57 mg/dL (ref 0.55–1.02)
EGFR CKD-EPI (2021) FEMALE: >90 mL/min/1.73m2 (ref >=60)
GLUCOSE RANDOM: 240 mg/dL — ABNORMAL HIGH (ref 70–179)
POTASSIUM: 4.3 mmol/L (ref 3.4–4.8)
PROTEIN TOTAL: 6.7 g/dL (ref 5.7–8.2)
SODIUM: 139 mmol/L (ref 135–145)

## 2023-11-30 LAB — HEMATOPATHOLOGY LEUKEMIA/LYMPHOMA FLOW CYTOMETRY, CSF
LYMPHS CSF: 4 %
NUCLEATED CELLS, CSF: 1300 ul — ABNORMAL HIGH (ref <=5)
NUMBER OF CELLS CSF: 100
OTHER  CELLS CSF: 96 %
RBC CSF: 522 ul — ABNORMAL HIGH (ref <2)

## 2023-11-30 LAB — MAGNESIUM: MAGNESIUM: 2 mg/dL (ref 1.6–2.6)

## 2023-11-30 LAB — PROTIME-INR
INR: 1.05
PROTIME: 12 s (ref 9.9–12.6)

## 2023-11-30 LAB — PHOSPHORUS: PHOSPHORUS: 4.8 mg/dL (ref 2.4–5.1)

## 2023-11-30 MED ADMIN — letermovir (PREVYMIS) tablet 480 mg: 480 mg | ORAL | @ 01:00:00 | Stop: 2023-12-25

## 2023-11-30 MED ADMIN — insulin lispro (HumaLOG) injection 0-6 Units: 0-6 [IU] | SUBCUTANEOUS | @ 22:00:00

## 2023-11-30 MED ADMIN — insulin lispro (HumaLOG) injection 0-6 Units: 0-6 [IU] | SUBCUTANEOUS | @ 13:00:00

## 2023-11-30 MED ADMIN — cyclobenzaprine (FLEXERIL) tablet 10 mg: 10 mg | ORAL | @ 04:00:00

## 2023-11-30 MED ADMIN — insulin lispro (HumaLOG) injection 6 Units: 6 [IU] | SUBCUTANEOUS | @ 16:00:00

## 2023-11-30 MED ADMIN — losartan (COZAAR) tablet 25 mg: 25 mg | ORAL | @ 13:00:00

## 2023-11-30 MED ADMIN — insulin NPH (HumuLIN,NovoLIN) injection 15 Units: 15 [IU] | SUBCUTANEOUS | @ 13:00:00

## 2023-11-30 MED ADMIN — cetirizine (ZYRTEC) tablet 10 mg: 10 mg | ORAL | @ 13:00:00

## 2023-11-30 MED ADMIN — midazolam (VERSED) injection 1 mg: 1 mg | INTRAVENOUS | @ 18:00:00 | Stop: 2023-11-30

## 2023-11-30 MED ADMIN — insulin lispro (HumaLOG) injection 6 Units: 6 [IU] | SUBCUTANEOUS | @ 22:00:00

## 2023-11-30 MED ADMIN — dexAMETHasone (DECADRON) 4 mg/mL injection 4 mg: 4 mg | INTRAVENOUS | @ 18:00:00

## 2023-11-30 MED ADMIN — insulin lispro (HumaLOG) injection 0-6 Units: 0-6 [IU] | SUBCUTANEOUS | @ 16:00:00

## 2023-11-30 MED ADMIN — dapsone tablet 100 mg: 100 mg | ORAL | @ 13:00:00 | Stop: 2023-12-25

## 2023-11-30 MED ADMIN — cyclobenzaprine (FLEXERIL) tablet 10 mg: 10 mg | ORAL | @ 10:00:00

## 2023-11-30 MED ADMIN — metoclopramide (REGLAN) injection 10 mg: 10 mg | INTRAVENOUS | @ 13:00:00

## 2023-11-30 MED ADMIN — dexAMETHasone (DECADRON) 4 mg/mL injection 4 mg: 4 mg | INTRAVENOUS | @ 10:00:00 | Stop: 2023-11-30

## 2023-11-30 MED ADMIN — escitalopram oxalate (LEXAPRO) tablet 10 mg: 10 mg | ORAL | @ 13:00:00 | Stop: 2023-12-25

## 2023-11-30 MED ADMIN — insulin lispro (HumaLOG) injection 6 Units: 6 [IU] | SUBCUTANEOUS | @ 13:00:00

## 2023-11-30 MED ADMIN — cytarabine (PF) (ARA-C) 100 mg, hydrocortisone sod succ (Solu-CORTEF) 50 mg in sodium chloride (NS) 0.9 % 6 mL INTRATHECAL syringe: INTRATHECAL | @ 18:00:00 | Stop: 2023-11-30

## 2023-11-30 MED ADMIN — valACYclovir (VALTREX) tablet 500 mg: 500 mg | ORAL | @ 01:00:00 | Stop: 2023-12-24

## 2023-11-30 MED ADMIN — dexAMETHasone (DECADRON) 4 mg/mL injection 4 mg: 4 mg | INTRAVENOUS | @ 04:00:00

## 2023-11-30 MED ADMIN — insulin NPH (HumuLIN,NovoLIN) injection 15 Units: 15 [IU] | SUBCUTANEOUS | @ 02:00:00

## 2023-11-30 MED ADMIN — diphenhydrAMINE (BENADRYL) injection: 25 mg | INTRAVENOUS | @ 13:00:00

## 2023-11-30 MED ADMIN — insulin lispro (HumaLOG) injection 0-6 Units: 0-6 [IU] | SUBCUTANEOUS | @ 01:00:00

## 2023-11-30 NOTE — Unmapped (Signed)
 Pt A&Ox4, VSS on RA, pt requested to have lights as low as possible. Having headache pain was given prns. LP is plan today 5/1. Call bell within reach.   Problem: Adult Inpatient Plan of Care  Goal: Plan of Care Review  Outcome: Progressing  Goal: Patient-Specific Goal (Individualized)  Outcome: Progressing  Goal: Absence of Hospital-Acquired Illness or Injury  Outcome: Progressing  Intervention: Prevent Skin Injury  Recent Flowsheet Documentation  Taken 11/29/2023 2000 by Bjorn Bullocks, RN  Positioning for Skin: Supine/Back  Goal: Optimal Comfort and Wellbeing  Outcome: Progressing  Goal: Readiness for Transition of Care  Outcome: Progressing  Goal: Rounds/Family Conference  Outcome: Progressing     Problem: Fall Injury Risk  Goal: Absence of Fall and Fall-Related Injury  Outcome: Progressing     Problem: Comorbidity Management  Goal: Maintenance of Asthma Control  Outcome: Progressing  Goal: Maintenance of Behavioral Health Symptom Control  Outcome: Progressing  Goal: Maintenance of COPD Symptom Control  Outcome: Progressing  Goal: Blood Glucose Levels Within Targeted Range  Outcome: Progressing  Goal: Maintenance of Heart Failure Symptom Control  Outcome: Progressing  Goal: Blood Pressure in Desired Range  Outcome: Progressing  Goal: Maintenance of Osteoarthritis Symptom Control  Outcome: Progressing  Goal: Bariatric Home Regimen Maintained  Outcome: Progressing  Goal: Maintenance of Seizure Control  Outcome: Progressing     Problem: Pain Acute  Goal: Optimal Pain Control and Function  Outcome: Progressing

## 2023-11-30 NOTE — Unmapped (Signed)
 Daily Progress Note      Principal Problem:    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)  Active Problems:    Blurred vision, bilateral    Hypertension    Headache    Metastasis to brain    Drug-induced injury of liver    Type 2 diabetes mellitus, with long-term current use of insulin    Hyperlipidemia       LOS: 6 days     Assessment/Plan:Patricia [name] is an 48 y.o. female with past medical history Ph+ ALL who was on CR on maintenance vincristine  + ponatinib  presenting with headaches for one month, with confirmed CNS relapse. Started on IT methotrexate  and dexamethasone .     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 Started on biweekly IT methotrexate  and dexamethasone . Bone marrow biopsy recommend to patient for restaging disease.  BM biopsy pending (likely 5/1 or 5/2)  Mon/Thurs schedule for LP with IT MTX. Next due 5/1 (today).   Headache: START dexamethasone  taper -- 4mg  q8h  PRN metoclopramide & PRN diphenhydramine   Plan to start ponatanib today (5/1) (due to disease progression). Will monitor LFTs daily.   Continue home valtrex , prevymis     Hypertension  History of chronic hypertension, home dosage of Losartan  50 mg. Currently on 25mg  due to liver injury. Will continue to monitor and uptitrate as needed.   Continue PO Losartan  25 mg (half home dose) up titrate as needed  Continue Labetalol PRN SBP>180 or DBP>120     Acute Liver Injury - C/f Tylenol  Toxicity  On admission found to have acutely elevated  ALT and AST. Liver doppler 4/26 without signs of veno-occlusive leukemic infiltration or portal vein thrombosis. Acute hepatitis panel negative. HIV negative. Trending labs with daily improvement.   Hepatology consulted, appreciate recs  Trend hepatic function panel   Trend PT-INR, CBC/Diff, CMP/Mg/Phos  START Ponatanib today (see above)    T2DM  Suspected elevation in BG during course of steroids. Will CTM with steriod taper.   NPH 15u BID  Increased SSI to SF 30  Lispro 6u AC TID    Neck pain  CT of Cervical spine at OSH hospital showed degenerative disc disease at C5-C6 with no spinal cord compression. Patient states she has increased pain in her neck from lying in bed all day. It improves with movement. No numbness or tingling. Improves with voltaren  gel and cold packs. Will continue to monitor symptoms   - Voltaren  gel prn   - Ice Packs   - PRN flexeril      Chronic Problems  HLD: HOLD rosuvastatin  10 mg nightly     Impending Electrolyte Abnormality Secondary to Chemotherapy and/or IV Fluids  -Daily Electrolyte monitoring  -Replete per [facility] guidelines.     Immunocompromised status: Patient is immunocompromised secondary to disease and chemotherapy  -Antimicrobial prophylaxis as above    Impending Pancytopenia secondary to Acute Leukemia and chemotherapy:   - Transfuse hgb <7  - Transfuse plt <10K    Nutrition: Diabetic                        Subjective:     Interval History: [name]. Patien discussed with outpatient hematologist yesterday and is ready to have bone marrow biopsy. She states the flexeril is helping the neck pain.     10  point ROS otherwise negative except as above in the HPI.     Objective:     Vital signs in last 24 hours:  Temp:  [36.3 ??C (97.4 ??F)-36.9 ??C (98.4 ??F)] 36.5 ??C (97.7 ??F)  Pulse:  [81-98] 81  Resp:  [16-20] 18  BP: (133-169)/(91-115) 139/94  MAP (mmHg):  [107-131] 108  SpO2:  [94 %-100 %] 95 %    Intake/Output last 3 shifts:  I/O last 3 completed shifts:  In: 565 [P.O.:565]  Out: -     Meds:  Current Facility-Administered Medications   Medication Dose Route Frequency Provider Last Rate Last Admin    albuterol  (PROVENTIL  HFA;VENTOLIN  HFA) 90 mcg/actuation inhaler 2 puff  2 puff Inhalation Q6H PRN Daphene Dys, MD        bismuth  subsalicylate (PEPTO-BISMOL) oral suspension  30 mL Oral Q6H PRN Rudloff, Michael W, MD   30 mL at 11/25/23 1648    cetirizine  (ZYRTEC ) tablet 10 mg  10 mg Oral Daily Rudloff, Michael W, MD   10 mg at 11/30/23 0859    CHEMO CLARIFICATION ORDER   Other Continuous PRN Stacey Dyke, MD        CHEMO CLARIFICATION ORDER   Other Continuous PRN Stacey Dyke, MD        cyclobenzaprine (FLEXERIL) tablet 10 mg  10 mg Oral TID PRN Gailene Youkhana C, DO   10 mg at 11/30/23 0981    cytarabine  (PF) (ARA-C) 100 mg, hydrocortisone  sod succ (Solu-CORTEF ) 50 mg in sodium chloride  (NS) 0.9 % 6 mL INTRATHECAL syringe   Intrathecal Once Stacey Dyke, MD        dapsone  tablet 100 mg  100 mg Oral Daily Daphene Dys, MD   100 mg at 11/30/23 1914    dexAMETHasone  (DECADRON ) 4 mg/mL injection 4 mg  4 mg Intravenous Q8H SCH Koleman Marling C, DO        dextrose  50 % in water (D50W) 50 % solution 12.5 g  12.5 g Intravenous Q15 Min PRN Cookmeyer, Helga Loan, MD        diclofenac  sodium (VOLTAREN ) 1 % gel 4 g  4 g Topical QID Loletha Ripper, MD   4 g at 11/29/23 1133    diphenhydrAMINE  (BENADRYL ) injection  25 mg Intravenous Q6H PRN Cornett, Fletcher Humble, MD   25 mg at 11/30/23 0858    [Provider Hold] enoxaparin (LOVENOX) syringe 40 mg  40 mg Subcutaneous Q24H Cookmeyer, Helga Loan, MD        escitalopram  oxalate (LEXAPRO ) tablet 10 mg  10 mg Oral Daily Daphene Dys, MD   10 mg at 11/30/23 7829    glucagon injection 1 mg  1 mg Intramuscular Once PRN Cookmeyer, Helga Loan, MD        glucose chewable tablet 16 g  16 g Oral Q10 Min PRN Cookmeyer, Helga Loan, MD        insulin lispro (HumaLOG) injection 0-6 Units  0-6 Units Subcutaneous ACHS Loletha Ripper, MD   3 Units at 11/30/23 0859    insulin lispro (HumaLOG) injection 6 Units  6 Units Subcutaneous 3xd Meals Katelynd Blauvelt C, DO   6 Units at 11/30/23 0859    insulin NPH (HumuLIN,NovoLIN) injection 15 Units  15 Units Subcutaneous Q12H [facility] Philicia Heyne C, DO   15 Units at 11/30/23 0901    IP OKAY TO TREAT   Other Continuous PRN Stacey Dyke, MD        IP  OKAY TO TREAT   Other Continuous PRN Stacey Dyke, MD        labetalol (NORMODYNE) injection  10 mg Intravenous Q6H PRN Teressa Ferdinand, MD   10 mg at 11/29/23 1640    letermovir  (PREVYMIS ) tablet 480 mg  480 mg Oral Daily Daphene Dys, MD   480 mg at 11/29/23 2116    lidocaine  (XYLOCAINE ) 20 mg/mL (2 %) injection 20 mL  20 mL Intradermal Once Bhatta, Manasa R, MD        losartan  (COZAAR ) tablet 25 mg  25 mg Oral Daily Rudloff, Michael W, MD   25 mg at 11/30/23 9629    metoclopramide (REGLAN) injection 10 mg  10 mg Intravenous Q6H PRN Cornett, Fletcher Humble, MD   10 mg at 11/30/23 5284    oxyCODONE  (ROXICODONE ) immediate release tablet 5 mg  5 mg Oral Q4H PRN Cookmeyer, Helga Loan, MD        senna (SENOKOT) tablet 2 tablet  2 tablet Oral Nightly PRN Cornett, Fletcher Humble, MD        Or    polyethylene glycol (MIRALAX ) packet 17 g  17 g Oral Daily PRN Cornett, Fletcher Humble, MD        PONATinib  (ICLUSIG ) tablet 30 mg  30 mg Oral Daily Stacey Dyke, MD        prochlorperazine  (COMPAZINE ) tablet 10 mg  10 mg Oral Q6H PRN Avery, Natalia, DO        Or    prochlorperazine  (COMPAZINE ) injection 10 mg  10 mg Intravenous Q6H PRN Avery, Natalia, DO   10 mg at 11/27/23 1847    valACYclovir  (VALTREX ) tablet 500 mg  500 mg Oral Daily Teressa Ferdinand, MD   500 mg at 11/29/23 2116       Physical Exam:  General: Resting, in no apparent distress, lying in bed  HEENT:  PERRL. EOMI. Normocephalic, atraumatic  Heart:  RRR.   Lungs:  Normal WOB on room air. CTAB.  Abdomen:  No distention or pain on palpation.    Skin:  No lesions on clothed exam.  Psychiatric:  Range of affect is appropriate.    Neurologic:  Alert and oriented to person, place, time and situation.  CNII-CNXII grossly intact.  Extremities:  Appear well-perfused. No clubbing, edema, or cyanosis.      Labs:  Recent Labs     11/28/23  0622 11/29/23  0520 11/30/23  0044   WBC 6.6 13.4* 11.0   NEUTROABS 5.7 12.1* 9.9*   LYMPHSABS 0.7* 0.9* 0.7*   HGB 13.0 12.5 11.8   HCT 38.6 36.0 35.5   PLT 205 243 225   CREATININE 0.49* 0.57 0.57   BUN 19 29* 28*   BILITOT 0.5 0.5 0.6   AST 22 19 18    ALT 455* 335* 252*   ALKPHOS 198* 173* 148*   K 4.5 4.2 4.3   MG 2.1 2.0 2.0   CALCIUM  10.9* 10.4 10.8*   NA 138 139 139   CL 100 101 102   CO2 28.0 27.0 28.0   PHOS 3.9 4.4 4.8   INR 1.07 1.00 1.05       Imaging:  No new.      Arlon Bergamo, DO,  11/30/2023  10:53 AM   Hematology/Oncology Department   [facility]   Group pager: [phone]

## 2023-11-30 NOTE — Unmapped (Signed)
 Lumbar Puncture Procedure with ultrasound guidance (CPT [phone]) with Intrathecal Chemo Administration (CPT [phone])      Pre-procedural Planning     Patient Name:: Patricia [name]  Patient MRN: [id number]    Pre-operative Diagnosis:  Administration of intrathecal chemo    Post-operative Diagnosis: same as above    Indications:  same as above    Contraindications to performing lumbar puncture: Patient had no contraindications. Specifically, no local skin infections over the proposed puncture site, known elevated ICP other than in idiopathic intracranial hypertension or cryptococcal meningitis, and/or an uncontrolled bleeding diathesis.     Imaging prior to lumbar puncture: Generally, imaging should be obtained prior to performing a lumbar puncture if the patient is > 5 years old, immunocompromised, has had a seizure within one week of presentation, has an abnormal level of consciousness, or an abnormal neurologic exam (including papilledema). Patient has no indication for CNS imaging prior to proceding with lumbar puncture.    Known Bleeding Diathesis: Patient/caregiver denies any known bleeding or platelet disorder.     Antiplatelet Agents: This patient is not on an antiplatelet agent.    Systemic Anticoagulation: This patient is not on full systemic anticoagulation.    Significant Labs:  INR   Date Value Ref Range Status   11/30/2023 1.05  Final     PT   Date Value Ref Range Status   11/30/2023 12.0 9.9 - 12.6 sec Final     APTT   Date Value Ref Range Status   11/24/2023 117.8 (H) 24.8 - 38.4 sec Final     Platelet   Date Value Ref Range Status   11/30/2023 225 150 - 450 10*9/L Final       Consent: Informed consent for the procedure was obtained from Patient after explanation of the risks, including bleeding into pain, post-lumbar puncture headache, backache, bleeding that could result in paralysis, herniation and infection, and benefits, including symptomatic improvement and diagnostic information. Potential alternatives were discussed and all questions answered.  Refer to the consent documentation.     Procedure Details     Time-out performed immediately prior to the procedure. If IT chemo given, 2 providers checked chemo orders prior to beginning procedure.    Patient was placed in the left lateral decubitus position with hips and neck in flexion.    The superior aspect of the iliac crests were identified, with the traverse demarcating the L4-L5 interspace.  A bedside ultrasound was used to help identify the spinous processes at L2, L3, L4, and L5 and the intervertebral spaces were located and marked.          This area was prepped with chlorhexidine and draped in the usual sterile fashion. Sterile technique was used including antiseptics, cap, gloves, hand hygiene, mask, and sterile sheet.  Local anesthesia with 1% lidocaine  was applied subcutaneously then deep to the skin. The 20 gauge cutting (eg. Quincke) spinal needle with trocar was introduced at the L2 - L3 interspace.  CSF was obtained at 7 cm after 3 pass(es), multiple redirections.  When CSF fluid was noted an opening pressure was not obtained.  Samples were collected and sent to the lab after proper labeling.     Intrathecal cytarabine  (Ara-C) was administered slowly over 5 min.    The spinal needle with trocar was removed with minimal bleeding noted upon removal. A sterile bandage was placed over the puncture site after holding pressure.    Findings     6 mL of hazy spinal fluid  was obtained.  The CSF was sent to lab for flow cytometry and culture            Condition     The patient tolerated the procedure well and remains in the same condition as pre-procedure.    Complications and Recommendations     None; patient tolerated the procedure well.      Requesting Service: Oncology/Hematology (MDE)    Time Requested: planned  Time Completed: 5/1  Comments: Procedure performed with assistance from second year PA student [employer]

## 2023-12-01 LAB — CBC W/ AUTO DIFF
BASOPHILS ABSOLUTE COUNT: 0 10*9/L (ref 0.0–0.1)
BASOPHILS RELATIVE PERCENT: 0.3 %
EOSINOPHILS ABSOLUTE COUNT: 0 10*9/L (ref 0.0–0.5)
EOSINOPHILS RELATIVE PERCENT: 0 %
HEMATOCRIT: 37.7 % (ref 34.0–44.0)
HEMOGLOBIN: 13 g/dL (ref 11.3–14.9)
LYMPHOCYTES ABSOLUTE COUNT: 0.7 10*9/L — ABNORMAL LOW (ref 1.1–3.6)
LYMPHOCYTES RELATIVE PERCENT: 6.6 %
MEAN CORPUSCULAR HEMOGLOBIN CONC: 34.5 g/dL (ref 32.0–36.0)
MEAN CORPUSCULAR HEMOGLOBIN: 33.1 pg — ABNORMAL HIGH (ref 25.9–32.4)
MEAN CORPUSCULAR VOLUME: 96 fL — ABNORMAL HIGH (ref 77.6–95.7)
MEAN PLATELET VOLUME: 8.8 fL (ref 6.8–10.7)
MONOCYTES ABSOLUTE COUNT: 0.5 10*9/L (ref 0.3–0.8)
MONOCYTES RELATIVE PERCENT: 4.8 %
NEUTROPHILS ABSOLUTE COUNT: 8.7 10*9/L — ABNORMAL HIGH (ref 1.8–7.8)
NEUTROPHILS RELATIVE PERCENT: 88.3 %
PLATELET COUNT: 214 10*9/L (ref 150–450)
RED BLOOD CELL COUNT: 3.92 10*12/L — ABNORMAL LOW (ref 3.95–5.13)
RED CELL DISTRIBUTION WIDTH: 17.5 % — ABNORMAL HIGH (ref 12.2–15.2)
WBC ADJUSTED: 9.9 10*9/L (ref 3.6–11.2)

## 2023-12-01 LAB — COMPREHENSIVE METABOLIC PANEL
ALBUMIN: 4 g/dL (ref 3.4–5.0)
ALKALINE PHOSPHATASE: 147 U/L — ABNORMAL HIGH (ref 46–116)
ALT (SGPT): 205 U/L — ABNORMAL HIGH (ref 10–49)
ANION GAP: 8 mmol/L (ref 5–14)
AST (SGOT): 22 U/L (ref <=34)
BILIRUBIN TOTAL: 0.6 mg/dL (ref 0.3–1.2)
BLOOD UREA NITROGEN: 27 mg/dL — ABNORMAL HIGH (ref 9–23)
BUN / CREAT RATIO: 49
CALCIUM: 10.2 mg/dL (ref 8.7–10.4)
CHLORIDE: 104 mmol/L (ref 98–107)
CO2: 28 mmol/L (ref 20.0–31.0)
CREATININE: 0.55 mg/dL (ref 0.55–1.02)
EGFR CKD-EPI (2021) FEMALE: >90 mL/min/1.73m2 (ref >=60)
GLUCOSE RANDOM: 195 mg/dL — ABNORMAL HIGH (ref 70–179)
POTASSIUM: 4.1 mmol/L (ref 3.4–4.8)
PROTEIN TOTAL: 7 g/dL (ref 5.7–8.2)
SODIUM: 140 mmol/L (ref 135–145)

## 2023-12-01 LAB — MAGNESIUM: MAGNESIUM: 2 mg/dL (ref 1.6–2.6)

## 2023-12-01 LAB — SMEAR - BONE MARROW PATIENT

## 2023-12-01 LAB — PROTIME-INR
INR: 1.05
PROTIME: 12 s (ref 9.9–12.6)

## 2023-12-01 LAB — PHOSPHORUS: PHOSPHORUS: 4.4 mg/dL (ref 2.4–5.1)

## 2023-12-01 MED ADMIN — insulin lispro (HumaLOG) injection 0-6 Units: 0-6 [IU] | SUBCUTANEOUS | @ 02:00:00

## 2023-12-01 MED ADMIN — insulin lispro (HumaLOG) injection 0-6 Units: 0-6 [IU] | SUBCUTANEOUS | @ 12:00:00

## 2023-12-01 MED ADMIN — valACYclovir (VALTREX) tablet 500 mg: 500 mg | ORAL | Stop: 2023-12-24

## 2023-12-01 MED ADMIN — insulin lispro (HumaLOG) injection 7 Units: 7 [IU] | SUBCUTANEOUS | @ 17:00:00

## 2023-12-01 MED ADMIN — dapsone tablet 100 mg: 100 mg | ORAL | @ 13:00:00 | Stop: 2023-12-25

## 2023-12-01 MED ADMIN — insulin lispro (HumaLOG) injection 0-6 Units: 0-6 [IU] | SUBCUTANEOUS | @ 16:00:00

## 2023-12-01 MED ADMIN — insulin NPH (HumuLIN,NovoLIN) injection 15 Units: 15 [IU] | SUBCUTANEOUS | @ 02:00:00

## 2023-12-01 MED ADMIN — dexAMETHasone (DECADRON) tablet 4 mg: 4 mg | ORAL | @ 21:00:00

## 2023-12-01 MED ADMIN — losartan (COZAAR) tablet 25 mg: 25 mg | ORAL | @ 13:00:00

## 2023-12-01 MED ADMIN — insulin NPH (HumuLIN,NovoLIN) injection 17 Units: 17 [IU] | SUBCUTANEOUS | @ 13:00:00

## 2023-12-01 MED ADMIN — PONATinib (ICLUSIG) tablet 30 mg ***PATIENT SUPPLIED***: 30 mg | ORAL | @ 01:00:00

## 2023-12-01 MED ADMIN — cyclobenzaprine (FLEXERIL) tablet 10 mg: 10 mg | ORAL

## 2023-12-01 MED ADMIN — dexAMETHasone (DECADRON) 4 mg/mL injection 4 mg: 4 mg | INTRAVENOUS | @ 10:00:00 | Stop: 2023-12-01

## 2023-12-01 MED ADMIN — insulin lispro (HumaLOG) injection 0-6 Units: 0-6 [IU] | SUBCUTANEOUS | @ 20:00:00

## 2023-12-01 MED ADMIN — enoxaparin (LOVENOX) syringe 40 mg: 40 mg | SUBCUTANEOUS | @ 15:00:00 | Stop: 2023-12-04

## 2023-12-01 MED ADMIN — lidocaine (XYLOCAINE) 20 mg/mL (2 %) injection 10 mL: 10 mL | @ 20:00:00 | Stop: 2023-12-01

## 2023-12-01 MED ADMIN — escitalopram oxalate (LEXAPRO) tablet 10 mg: 10 mg | ORAL | @ 13:00:00 | Stop: 2023-12-25

## 2023-12-01 MED ADMIN — insulin lispro (HumaLOG) injection 7 Units: 7 [IU] | SUBCUTANEOUS | @ 13:00:00

## 2023-12-01 MED ADMIN — midazolam (VERSED) injection 2 mg: 2 mg | INTRAVENOUS | @ 19:00:00 | Stop: 2023-12-01

## 2023-12-01 MED ADMIN — cyclobenzaprine (FLEXERIL) tablet 10 mg: 10 mg | ORAL | @ 17:00:00

## 2023-12-01 MED ADMIN — aspirin chewable tablet 81 mg: 81 mg | ORAL | @ 15:00:00

## 2023-12-01 MED ADMIN — dexAMETHasone (DECADRON) 4 mg/mL injection 4 mg: 4 mg | INTRAVENOUS | @ 02:00:00

## 2023-12-01 MED ADMIN — cetirizine (ZYRTEC) tablet 10 mg: 10 mg | ORAL | @ 13:00:00

## 2023-12-01 MED ADMIN — letermovir (PREVYMIS) tablet 480 mg: 480 mg | ORAL | Stop: 2023-12-25

## 2023-12-01 NOTE — Unmapped (Signed)
 Indications   Indications: B-ALL    Pre-Medications: 2 mg versed      Informed consent was obtained, the risks, benefits, and alternatives to the procedure were discussed. All questions were answered.      Procedure   TimeOut    Performed immediately prior to the procedure    Name :Bone Marrow Biopsy + Aspirate      Description:   From the prone position, the left posterior iliac crest was identified. The area was prepped and draped in the usual sterile fashion. Fifteen ml of 2% lidocaine  was used to anesthetize the skin, subcutaneous tissue and periosteum. Once adequate anesthesia was achieved, a 5-mm incision was made over the posterior iliac crest and a 6 Ranfac bone marrow biopsy needle introduced. Using a gentle twisting motion, the needle was advanced through cortical bone into the marrow space. Once in the marrow space an aspirate was obtained into a syringe containing EDTA: spicules were confirmed. Six ml of aspirate was then drawn into a heparin -containing syringe. Six ml of aspirate was then drawn into an EDTA containing-syringe. The needle was then advanced and a core bone marrow biopsy obtained. Per standard procedure, pressure was applied to the area for 5 minutes and a pressure dressing applied.     Complications   Complications:   None.     Specimen(s)   Specimen(s):   (1) aspirate into EDTA syringe   (1) aspirate into heparin  syringe   (1) core biopsy

## 2023-12-01 NOTE — Unmapped (Signed)
 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 Started on IT methotrexate  and dexamethasone .     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 Started on biweekly IT methotrexate  and dexamethasone . Bone marrow biopsy recommend to patient for restaging disease.  BM biopsy today 5/2  Flow MRD and Clonoseq  Mon/Thurs schedule for LP with IT MTX. Next due 5/5   Headache: dexamethasone  4mg  q12h (weaned 5/2)  PRN metoclopramide  & PRN diphenhydramine   Ponatanib restarted 5/1 due to disease progression  Monitor LFTs daily  Restart ASA 81mg  daily for arterial clot ppx  Restart enoxaparin  40mg  subQ for DVT ppx (hold day of LP)  Prophylaxis:valtrex , Prevymis     Hypertension  History of chronic hypertension, home dosage of Losartan  50 mg. Currently on 25mg  due to liver injury. Will continue to monitor and uptitrate as needed.   Continue PO Losartan  25 mg (half home dose) up titrate as needed  Continue Labetalol  PRN SBP>180 or DBP>120     Acute Liver Injury - C/f Tylenol  Toxicity  On admission found to have acutely elevated  ALT and AST. Liver doppler 4/26 without signs of veno-occlusive leukemic infiltration or portal vein thrombosis. Acute hepatitis panel negative. HIV negative. Trending labs with daily improvement.   Trend hepatic function panel   Trend PT-INR, CBC/Diff, CMP/Mg/Phos  Monitor LFTs after ponatanib resumption    T2DM  Suspected elevation in BG during course of steroids. Will CTM with steriod taper.   NPH 17u BID (increased 5/2)  Increased SSI to SF 30  Lispro 7u AC TID (increased 5/2)    Neck pain  CT of Cervical spine at [facility] hospital showed degenerative disc disease at C5-C6 with no spinal cord compression. Patient states she has increased pain in her neck from lying in bed all day. It improves with movement and muscle relaxers. No numbness or tingling. Improves with voltaren  gel and cold packs. Will continue to monitor symptoms   - Voltaren  gel prn   - Ice Packs   - PRN Flexeril       Chronic Problems  HLD: HOLD rosuvastatin  10 mg nightly     Impending Electrolyte Abnormality Secondary to Chemotherapy and/or IV Fluids  -Daily Electrolyte monitoring  -Replete per [facility] guidelines.     Immunocompromised status: Patient is immunocompromised secondary to disease and chemotherapy  -Antimicrobial prophylaxis as above    Impending Pancytopenia secondary to Acute Leukemia and chemotherapy:   - Transfuse hgb <7  - Transfuse plt <10K    Nutrition: Diabetic                        Subjective:     Interval History:  - 1x emesis overnight with administration of steroids, right  after she had taken her ponatinib   - Continued intermittent neck/MSK pain relieved with Flexeril     10 point ROS otherwise negative except as above in the HPI.     Objective:     Vital signs in last 24 hours:  Temp:  [36.5 ??C (97.7 ??F)-36.8 ??C (98.2 ??F)] 36.5 ??C (97.7 ??F)  Pulse:  [81-95] 81  Resp:  [16-18] 17  BP: (141-169)/(98-122) 156/112  MAP (mmHg):  [112-133] 124  SpO2:  [94 %-96 %] 94 %    Intake/Output last 3 shifts:  No intake/output data recorded.    Meds:  Current Facility-Administered Medications   Medication Dose Route Frequency Provider Last Rate Last Admin    albuterol  (PROVENTIL  HFA;VENTOLIN  HFA) 90 mcg/actuation inhaler 2 puff  2 puff Inhalation Q6H PRN Daphene Dys, MD        aspirin  chewable tablet 81 mg  81 mg Oral Daily Kitay, Ilona, MD   81 mg at 12/01/23 1048    bismuth  subsalicylate (PEPTO-BISMOL) oral suspension  30 mL Oral Q6H PRN Rudloff, Michael W, MD   30 mL at 11/25/23 1648    cetirizine  (ZYRTEC ) tablet 10 mg  10 mg Oral Daily Rudloff, Michael W, MD   10 mg at 12/01/23 0109    CHEMO CLARIFICATION ORDER   Other Continuous PRN Stacey Dyke, MD        CHEMO CLARIFICATION ORDER   Other Continuous PRN Stacey Dyke, MD        cyclobenzaprine  (FLEXERIL ) tablet 10 mg  10 mg Oral TID PRN Stone, Ellet C, DO   10 mg at 11/30/23 2019    dapsone  tablet 100 mg  100 mg Oral Daily Daphene Dys, MD   100 mg at 12/01/23 3235    dexAMETHasone  (DECADRON ) tablet 4 mg  4 mg Oral Q12H [facility] Kitay, Ilona, MD        dextrose  50 % in water (D50W) 50 % solution 12.5 g  12.5 g Intravenous Q15 Min PRN Cookmeyer, Helga Loan, MD        diclofenac  sodium (VOLTAREN ) 1 % gel 4 g  4 g Topical QID Loletha Ripper, MD   4 g at 11/29/23 1133    diphenhydrAMINE  (BENADRYL ) injection  25 mg Intravenous Q6H PRN Cornett, Fletcher Humble, MD   25 mg at 11/30/23 0858    enoxaparin  (LOVENOX ) syringe 40 mg  40 mg Subcutaneous Q24H [facility] Kitay, Ilona, MD   40 mg at 12/01/23 1048    escitalopram  oxalate (LEXAPRO ) tablet 10 mg  10 mg Oral Daily Daphene Dys, MD   10 mg at 12/01/23 5732    glucagon injection 1 mg  1 mg Intramuscular Once PRN Cookmeyer, Helga Loan, MD        glucose chewable tablet 16 g  16 g Oral Q10 Min PRN Cookmeyer, Helga Loan, MD        insulin  lispro (HumaLOG ) injection 0-6 Units  0-6 Units Subcutaneous ACHS Loletha Ripper, MD   1 Units at 12/01/23 2025    insulin  lispro (HumaLOG ) injection 7 Units  7 Units Subcutaneous [employer] Oberon Hehir S, MD   7 Units at 12/01/23 0830    insulin  NPH (HumuLIN ,NovoLIN ) injection 17 Units  17 Units Subcutaneous Q12H [facility] Shannan Garfinkel S, MD   17 Units at 12/01/23 0831    IP OKAY TO TREAT   Other Continuous PRN Stacey Dyke, MD        IP OKAY TO TREAT  Other Continuous PRN Stacey Dyke, MD        labetalol  (NORMODYNE ) injection  10 mg Intravenous Q6H PRN Teressa Ferdinand, MD   10 mg at 11/29/23 1640    letermovir  (PREVYMIS ) tablet 480 mg  480 mg Oral Daily Daphene Dys, MD   480 mg at 11/30/23 2021    lidocaine  (ASPERCREME) 4 % 1 patch  1 patch Transdermal Daily Zewdu, Winthrop Hawks, MD        losartan  (COZAAR ) tablet 25 mg  25 mg Oral Daily Rudloff, Michael W, MD   25 mg at 12/01/23 0833    metoclopramide  (REGLAN ) injection 10 mg  10 mg Intravenous Q6H PRN Cornett, Fletcher Humble, MD   10 mg at 11/30/23 9563    midazolam  (VERSED ) injection 2 mg  2 mg Intravenous Once PRN Superdock, Michael A, MD        oxyCODONE  (ROXICODONE ) immediate release tablet 5 mg  5 mg Oral Q4H PRN Cookmeyer, Helga Loan, MD        senna (SENOKOT) tablet 2 tablet  2 tablet Oral Nightly PRN Cornett, Fletcher Humble, MD        Or    polyethylene glycol (MIRALAX ) packet 17 g  17 g Oral Daily PRN Cornett, Fletcher Humble, MD        PONATinib  (ICLUSIG ) tablet 30 mg PATIENT SUPPLIED  30 mg Oral Daily Stacey Dyke, MD   30 mg at 11/30/23 2129    prochlorperazine  (COMPAZINE ) tablet 10 mg  10 mg Oral Q6H PRN Avery, Natalia, DO        Or    prochlorperazine  (COMPAZINE ) injection 10 mg  10 mg Intravenous Q6H PRN Avery, Natalia, DO   10 mg at 11/27/23 1847    valACYclovir  (VALTREX ) tablet 500 mg  500 mg Oral Daily Teressa Ferdinand, MD   500 mg at 11/30/23 2021       Physical Exam:  General: Resting, in no apparent distress, lying in bed  HEENT:  PERRL. EOMI. Normocephalic, atraumatic  Heart:  RRR, no m/r/g appreciated  Lungs:  Normal WOB on room air. CTAB.  Abdomen:  No distention or pain on palpation.    Skin:  No lesions on clothed exam. LP (5/1) site with Band-Aid without erythema/swelling/ecchymosis  Psychiatric:  Range of affect is appropriate.    Neurologic:  Alert and oriented to person, place, time and situation.  CNII-CNXII grossly intact.  Extremities:  Appear well-perfused. No clubbing, edema, or cyanosis.      Labs:  Recent Labs     11/29/23  0520 11/30/23  0044 12/01/23  0617   WBC 13.4* 11.0 9.9   NEUTROABS 12.1* 9.9* 8.7*   LYMPHSABS 0.9* 0.7* 0.7*   HGB 12.5 11.8 13.0   HCT 36.0 35.5 37.7   PLT 243 225 214   CREATININE 0.57 0.57 0.55   BUN 29* 28* 27*   BILITOT 0.5 0.6 0.6   AST 19 18 22    ALT 335* 252* 205*   ALKPHOS 173* 148* 147*   K 4.2 4.3 4.1   MG 2.0 2.0 2.0   CALCIUM  10.4 10.8* 10.2   NA 139 139 140   CL 101 102 104   CO2 27.0 28.0 28.0   PHOS 4.4 4.8 4.4   INR 1.00 1.05 1.05       Imaging:  No new.      Kristina Pfeiffer, MD  Med-Peds PGY-4  12/01/23 11:46 AM

## 2023-12-01 NOTE — Unmapped (Signed)
 Patient Aox4, VSS this shift. Hypertensive this shift, MD aware. Flexeril  given x1 for pain relief  and valium x1 prior to bone marrow biopsy. All other VSS, no acute events this shift. ACHS and meal time insulin  given per orders. Call light within reach.   Problem: Adult Inpatient Plan of Care  Goal: Plan of Care Review  Outcome: Progressing  Goal: Patient-Specific Goal (Individualized)  Outcome: Progressing  Goal: Absence of Hospital-Acquired Illness or Injury  Outcome: Progressing  Intervention: Identify and Manage Fall Risk  Recent Flowsheet Documentation  Taken 12/01/2023 0827 by Martell Skinner, RN  Safety Interventions:   environmental modification   fall reduction program maintained   infection management   isolation precautions   lighting adjusted for tasks/safety   low bed   nonskid shoes/slippers when out of bed  Intervention: Prevent Skin Injury  Recent Flowsheet Documentation  Taken 12/01/2023 0830 by Martell Skinner, RN  Positioning for Skin: Supine/Back  Intervention: Prevent and Manage VTE (Venous Thromboembolism) Risk  Recent Flowsheet Documentation  Taken 12/01/2023 0830 by Martell Skinner, RN  VTE Prevention/Management: anticoagulant therapy  Intervention: Prevent Infection  Recent Flowsheet Documentation  Taken 12/01/2023 0827 by Martell Skinner, RN  Infection Prevention:   cohorting utilized   environmental surveillance performed   equipment surfaces disinfected   hand hygiene promoted   personal protective equipment utilized   rest/sleep promoted   single patient room provided   visitors restricted/screened  Goal: Optimal Comfort and Wellbeing  Outcome: Progressing  Goal: Readiness for Transition of Care  Outcome: Progressing  Goal: Rounds/Family Conference  Outcome: Progressing     Problem: Fall Injury Risk  Goal: Absence of Fall and Fall-Related Injury  Outcome: Progressing  Intervention: Promote Injury-Free Environment  Recent Flowsheet Documentation  Taken 12/01/2023 0827 by Martell Skinner, RN  Safety Interventions:   environmental modification   fall reduction program maintained   infection management   isolation precautions   lighting adjusted for tasks/safety   low bed   nonskid shoes/slippers when out of bed     Problem: Comorbidity Management  Goal: Maintenance of Asthma Control  Outcome: Progressing  Goal: Maintenance of Behavioral Health Symptom Control  Outcome: Progressing  Goal: Maintenance of COPD Symptom Control  Outcome: Progressing  Goal: Blood Glucose Levels Within Targeted Range  Outcome: Progressing  Goal: Maintenance of Heart Failure Symptom Control  Outcome: Progressing  Goal: Blood Pressure in Desired Range  Outcome: Progressing  Goal: Maintenance of Osteoarthritis Symptom Control  Outcome: Progressing  Goal: Bariatric Home Regimen Maintained  Outcome: Progressing  Goal: Maintenance of Seizure Control  Outcome: Progressing     Problem: Pain Acute  Goal: Optimal Pain Control and Function  Outcome: Progressing

## 2023-12-01 NOTE — Unmapped (Addendum)
 Medicine Procedure Service Follow-up Consult Note    Assessment/Recommendations:    Patricia [name] is a 48 y.o. 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    The Medicine Procedure Service was consulted for lumbar puncture with IT chemo, which was performed uneventfully at the bedside on 5/1.    No procedural complications identified.    Thank you for involving us  in the care of your patient.  We will sign off for now, but please page us  at [phone] with questions/concerns, or if we may be of further assistance.    --------------------------  Of note, for anticipated future procedures, this image was one in the past with patient sitting upright during LP. The two that I have done have been with her in left lying decubitus position, I think roughly at the site marked by the red X. My puncture site correlated ultrasonographically with the L2-L3 space.      ___________________________________________________________________    Subjective:  No significant back pain or headache. No new LE neuro symptoms. Anticipating bone marrow biopsy today.    Labs/Studies:  Labs and Studies from the last 24hrs per EMR and Reviewed  CSF with 1300 nucleated cells, blasts present    Objective:  Temp:  [36.5 ??C (97.7 ??F)-36.8 ??C (98.2 ??F)] 36.5 ??C (97.7 ??F)  Pulse:  [81-95] 88  Resp:  [16-18] 16  BP: (139-169)/(94-122) 163/112  SpO2:  [94 %-96 %] 95 %    GEN: Lying in bed in NAD  NEURO: Symmetric strength in LE's and SILT  SKIN: LP site with minimal tenderness and no erythema or ecchymosis

## 2023-12-01 NOTE — Unmapped (Signed)
 Problem: Adult Inpatient Plan of Care  Goal: Plan of Care Review  Outcome: Ongoing - Unchanged  Goal: Patient-Specific Goal (Individualized)  Outcome: Ongoing - Unchanged  Goal: Absence of Hospital-Acquired Illness or Injury  Outcome: Ongoing - Unchanged  Intervention: Identify and Manage Fall Risk  Recent Flowsheet Documentation  Taken 11/30/2023 2152 by Idelle Majors, RN  Safety Interventions:   environmental modification   fall reduction program maintained   infection management   isolation precautions   lighting adjusted for tasks/safety   low bed   nonskid shoes/slippers when out of bed  Intervention: Prevent Infection  Recent Flowsheet Documentation  Taken 11/30/2023 2152 by Idelle Majors, RN  Infection Prevention:   cohorting utilized   environmental surveillance performed   equipment surfaces disinfected   hand hygiene promoted   personal protective equipment utilized   rest/sleep promoted   single patient room provided   visitors restricted/screened  Goal: Optimal Comfort and Wellbeing  Outcome: Ongoing - Unchanged  Goal: Readiness for Transition of Care  Outcome: Ongoing - Unchanged  Goal: Rounds/Family Conference  Outcome: Ongoing - Unchanged     Problem: Fall Injury Risk  Goal: Absence of Fall and Fall-Related Injury  Outcome: Ongoing - Unchanged  Intervention: Promote Injury-Free Environment  Recent Flowsheet Documentation  Taken 11/30/2023 2152 by Idelle Majors, RN  Safety Interventions:   environmental modification   fall reduction program maintained   infection management   isolation precautions   lighting adjusted for tasks/safety   low bed   nonskid shoes/slippers when out of bed     Problem: Comorbidity Management  Goal: Maintenance of Asthma Control  Outcome: Ongoing - Unchanged  Goal: Maintenance of Behavioral Health Symptom Control  Outcome: Ongoing - Unchanged  Goal: Maintenance of COPD Symptom Control  Outcome: Ongoing - Unchanged  Goal: Blood Glucose Levels Within Targeted Range  Outcome: Ongoing - Unchanged  Goal: Maintenance of Heart Failure Symptom Control  Outcome: Ongoing - Unchanged  Goal: Blood Pressure in Desired Range  Outcome: Ongoing - Unchanged  Goal: Maintenance of Osteoarthritis Symptom Control  Outcome: Ongoing - Unchanged  Goal: Bariatric Home Regimen Maintained  Outcome: Ongoing - Unchanged  Goal: Maintenance of Seizure Control  Outcome: Ongoing - Unchanged     Problem: Pain Acute  Goal: Optimal Pain Control and Function  Outcome: Ongoing - Unchanged

## 2023-12-02 LAB — CBC W/ AUTO DIFF
BASOPHILS ABSOLUTE COUNT: 0 10*9/L (ref 0.0–0.1)
BASOPHILS RELATIVE PERCENT: 0.2 %
EOSINOPHILS ABSOLUTE COUNT: 0 10*9/L (ref 0.0–0.5)
EOSINOPHILS RELATIVE PERCENT: 0.1 %
HEMATOCRIT: 38.8 % (ref 34.0–44.0)
HEMOGLOBIN: 13.2 g/dL (ref 11.3–14.9)
LYMPHOCYTES ABSOLUTE COUNT: 0.9 10*9/L — ABNORMAL LOW (ref 1.1–3.6)
LYMPHOCYTES RELATIVE PERCENT: 8.6 %
MEAN CORPUSCULAR HEMOGLOBIN CONC: 34.1 g/dL (ref 32.0–36.0)
MEAN CORPUSCULAR HEMOGLOBIN: 32.4 pg (ref 25.9–32.4)
MEAN CORPUSCULAR VOLUME: 94.9 fL (ref 77.6–95.7)
MEAN PLATELET VOLUME: 8.5 fL (ref 6.8–10.7)
MONOCYTES ABSOLUTE COUNT: 0.8 10*9/L (ref 0.3–0.8)
MONOCYTES RELATIVE PERCENT: 7.7 %
NEUTROPHILS ABSOLUTE COUNT: 8.8 10*9/L — ABNORMAL HIGH (ref 1.8–7.8)
NEUTROPHILS RELATIVE PERCENT: 83.4 %
PLATELET COUNT: 203 10*9/L (ref 150–450)
RED BLOOD CELL COUNT: 4.08 10*12/L (ref 3.95–5.13)
RED CELL DISTRIBUTION WIDTH: 17.4 % — ABNORMAL HIGH (ref 12.2–15.2)
WBC ADJUSTED: 10.5 10*9/L (ref 3.6–11.2)

## 2023-12-02 LAB — PROTIME-INR
INR: 1.04
PROTIME: 11.9 s (ref 9.9–12.6)

## 2023-12-02 LAB — COMPREHENSIVE METABOLIC PANEL
ALBUMIN: 4.1 g/dL (ref 3.4–5.0)
ALKALINE PHOSPHATASE: 141 U/L — ABNORMAL HIGH (ref 46–116)
ALT (SGPT): 192 U/L — ABNORMAL HIGH (ref 10–49)
ANION GAP: 11 mmol/L (ref 5–14)
AST (SGOT): 38 U/L — ABNORMAL HIGH (ref <=34)
BILIRUBIN TOTAL: 0.7 mg/dL (ref 0.3–1.2)
BLOOD UREA NITROGEN: 26 mg/dL — ABNORMAL HIGH (ref 9–23)
BUN / CREAT RATIO: 46
CALCIUM: 10.6 mg/dL — ABNORMAL HIGH (ref 8.7–10.4)
CHLORIDE: 104 mmol/L (ref 98–107)
CO2: 28 mmol/L (ref 20.0–31.0)
CREATININE: 0.57 mg/dL (ref 0.55–1.02)
EGFR CKD-EPI (2021) FEMALE: >90 mL/min/1.73m2 (ref >=60)
GLUCOSE RANDOM: 152 mg/dL (ref 70–179)
POTASSIUM: 4.1 mmol/L (ref 3.4–4.8)
PROTEIN TOTAL: 7.2 g/dL (ref 5.7–8.2)
SODIUM: 143 mmol/L (ref 135–145)

## 2023-12-02 LAB — PHOSPHORUS: PHOSPHORUS: 3.6 mg/dL (ref 2.4–5.1)

## 2023-12-02 LAB — MAGNESIUM: MAGNESIUM: 2.1 mg/dL (ref 1.6–2.6)

## 2023-12-02 MED ADMIN — enoxaparin (LOVENOX) syringe 40 mg: 40 mg | SUBCUTANEOUS | @ 13:00:00 | Stop: 2023-12-04

## 2023-12-02 MED ADMIN — escitalopram oxalate (LEXAPRO) tablet 10 mg: 10 mg | ORAL | @ 13:00:00 | Stop: 2023-12-25

## 2023-12-02 MED ADMIN — insulin lispro (HumaLOG) injection 0-6 Units: 0-6 [IU] | SUBCUTANEOUS | @ 03:00:00

## 2023-12-02 MED ADMIN — dexAMETHasone (DECADRON) tablet 2 mg: 2 mg | ORAL | @ 21:00:00

## 2023-12-02 MED ADMIN — insulin lispro (HumaLOG) injection 0-6 Units: 0-6 [IU] | SUBCUTANEOUS | @ 12:00:00

## 2023-12-02 MED ADMIN — insulin lispro (HumaLOG) injection 0-6 Units: 0-6 [IU] | SUBCUTANEOUS | @ 17:00:00

## 2023-12-02 MED ADMIN — cetirizine (ZYRTEC) tablet 10 mg: 10 mg | ORAL | @ 13:00:00

## 2023-12-02 MED ADMIN — dexAMETHasone (DECADRON) tablet 4 mg: 4 mg | ORAL | @ 10:00:00 | Stop: 2023-12-02

## 2023-12-02 MED ADMIN — letermovir (PREVYMIS) tablet 480 mg: 480 mg | ORAL | @ 02:00:00 | Stop: 2023-12-25

## 2023-12-02 MED ADMIN — dapsone tablet 100 mg: 100 mg | ORAL | @ 13:00:00 | Stop: 2023-12-25

## 2023-12-02 MED ADMIN — valACYclovir (VALTREX) tablet 500 mg: 500 mg | ORAL | @ 02:00:00 | Stop: 2023-12-24

## 2023-12-02 MED ADMIN — insulin lispro (HumaLOG) injection 7 Units: 7 [IU] | SUBCUTANEOUS | @ 17:00:00

## 2023-12-02 MED ADMIN — insulin lispro (HumaLOG) injection 0-6 Units: 0-6 [IU] | SUBCUTANEOUS | @ 21:00:00

## 2023-12-02 MED ADMIN — cyclobenzaprine (FLEXERIL) tablet 10 mg: 10 mg | ORAL | @ 09:00:00

## 2023-12-02 MED ADMIN — losartan (COZAAR) tablet 25 mg: 25 mg | ORAL | @ 13:00:00

## 2023-12-02 MED ADMIN — PONATinib (ICLUSIG) tablet 30 mg ***PATIENT SUPPLIED***: 30 mg | ORAL | @ 02:00:00

## 2023-12-02 MED ADMIN — insulin NPH (HumuLIN,NovoLIN) injection 17 Units: 17 [IU] | SUBCUTANEOUS | @ 13:00:00 | Stop: 2023-12-02

## 2023-12-02 MED ADMIN — insulin lispro (HumaLOG) injection 7 Units: 7 [IU] | SUBCUTANEOUS | @ 13:00:00

## 2023-12-02 MED ADMIN — insulin lispro (HumaLOG) injection 7 Units: 7 [IU] | SUBCUTANEOUS | @ 21:00:00

## 2023-12-02 MED ADMIN — aspirin chewable tablet 81 mg: 81 mg | ORAL | @ 13:00:00

## 2023-12-02 MED ADMIN — insulin NPH (HumuLIN,NovoLIN) injection 17 Units: 17 [IU] | SUBCUTANEOUS | @ 02:00:00

## 2023-12-02 NOTE — Unmapped (Signed)
 Daily Progress Note      Principal Problem:    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)  Active Problems:    Blurred vision, bilateral    Hypertension    Headache    Metastasis to brain    Drug-induced injury of liver    Type 2 diabetes mellitus, with long-term current use of insulin     Hyperlipidemia       LOS: 8 days     Assessment/Plan:Patricia Kirk Peper [name] is an 48 y.o. 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 Started on IT methotrexate  and dexamethasone .     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 Started on biweekly IT methotrexate  and dexamethasone . Bone marrow biopsy recommend to patient for restaging disease.  BM biopsy (5/2)  Flow MRD and Clonoseq pending  Mon/Thurs schedule for LP with IT MTX. Next due 5/5   Headache: dexamethasone  2mg  q12h (weaned 5/3)  PRN metoclopramide  & PRN diphenhydramine   Ponatanib restarted 5/1 due to disease progression  Monitor LFTs daily  Continue ASA 81mg  daily for arterial clot ppx  Continue enoxaparin  40mg  subQ for DVT ppx (hold day of LP)  Prophylaxis:valtrex , Prevymis     Hypertension  History of chronic hypertension, home dosage of Losartan  50 mg. Currently on 25mg  due to liver injury. Will continue to monitor and uptitrate as needed.   Continue PO Losartan  25 mg (half home dose) up titrate as needed  Continue Labetalol  PRN SBP>180 or DBP>120     Acute Liver Injury - C/f Tylenol  Toxicity  On admission found to have acutely elevated  ALT and AST. Liver doppler 4/26 without signs of veno-occlusive leukemic infiltration or portal vein thrombosis. Acute hepatitis panel negative. HIV negative. Trending labs with daily improvement.   Trend hepatic function panel   Trend PT-INR, CBC/Diff, CMP/Mg/Phos  Monitor LFTs after ponatanib resumption     T2DM  Suspected elevation in BG during course of steroids. Will CTM with steriod taper.   NPH 15u BID (decreased 5/3 - fasting glucose trending down/steriods being tapered)  Increased SSI to SF 30  Lispro 7u AC TID    Neck pain  CT of Cervical spine at OSH hospital showed degenerative disc disease at C5-C6 with no spinal cord compression. Patient states she has increased pain in her neck from lying in bed all day. It improves with movement and muscle relaxers. No numbness or tingling. Improves with voltaren  gel and cold packs. Will continue to monitor symptoms   - Voltaren  gel prn   - Ice Packs   - PRN Flexeril       Chronic Problems  HLD: HOLD rosuvastatin  10 mg nightly     Impending Electrolyte Abnormality Secondary to Chemotherapy and/or IV Fluids  -Daily Electrolyte monitoring  -Replete per [facility] guidelines.     Immunocompromised status: Patient is immunocompromised secondary to disease and chemotherapy  -Antimicrobial prophylaxis as above    Impending Pancytopenia secondary to Acute Leukemia and chemotherapy:   - Transfuse hgb <7  - Transfuse plt <10K    Nutrition: Diabetic                        Subjective:     Interval History:  Feeling well this  morning. Neck pain improving significantly with flexeril .     10 point ROS otherwise negative except as above in the HPI.     Objective:     Vital signs in last 24 hours:  Temp:  [36.5 ??C (97.7 ??F)-36.6 ??C (97.9 ??F)] 36.5 ??C (97.7 ??F)  Pulse:  [81-114] 114  Resp:  [16-17] 16  BP: (137-170)/(103-115) 150/109  MAP (mmHg):  [117-130] 122  SpO2:  [93 %-96 %] 96 %    Intake/Output last 3 shifts:  I/O last 3 completed shifts:  In: 150 [P.O.:150]  Out: -     Meds:  Current Facility-Administered Medications   Medication Dose Route Frequency Provider Last Rate Last Admin    albuterol  (PROVENTIL  HFA;VENTOLIN  HFA) 90 mcg/actuation inhaler 2 puff  2 puff Inhalation Q6H PRN Daphene Dys, MD        aspirin  chewable tablet 81 mg  81 mg Oral Daily Kitay, Ilona, MD   81 mg at 12/01/23 1048    bismuth  subsalicylate (PEPTO-BISMOL) oral suspension  30 mL Oral Q6H PRN Rudloff, Michael W, MD   30 mL at 11/25/23 1648    cetirizine  (ZYRTEC ) tablet 10 mg  10 mg Oral Daily Rudloff, Michael W, MD   10 mg at 12/01/23 1610    CHEMO CLARIFICATION ORDER   Other Continuous PRN Stacey Dyke, MD        CHEMO CLARIFICATION ORDER   Other Continuous PRN Stacey Dyke, MD        cyclobenzaprine  (FLEXERIL ) tablet 10 mg  10 mg Oral TID PRN Lindsay Rho, Ethelyn Cerniglia C, DO   10 mg at 11/30/23 2019    dapsone  tablet 100 mg  100 mg Oral Daily Daphene Dys, MD   100 mg at 12/01/23 9604    dexAMETHasone  (DECADRON ) tablet 4 mg  4 mg Oral Q12H [facility] Kitay, Ilona, MD        dextrose  50 % in water (D50W) 50 % solution 12.5 g  12.5 g Intravenous Q15 Min PRN Cookmeyer, Helga Loan, MD        diclofenac  sodium (VOLTAREN ) 1 % gel 4 g  4 g Topical QID Loletha Ripper, MD   4 g at 11/29/23 1133    diphenhydrAMINE  (BENADRYL ) injection  25 mg Intravenous Q6H PRN Cornett, Fletcher Humble, MD   25 mg at 11/30/23 0858    enoxaparin  (LOVENOX ) syringe 40 mg  40 mg Subcutaneous Q24H [facility] Kitay, Ilona, MD   40 mg at 12/01/23 1048    escitalopram  oxalate (LEXAPRO ) tablet 10 mg  10 mg Oral Daily Daphene Dys, MD   10 mg at 12/01/23 5409    glucagon injection 1 mg  1 mg Intramuscular Once PRN Cookmeyer, Helga Loan, MD        glucose chewable tablet 16 g  16 g Oral Q10 Min PRN Cookmeyer, Helga Loan, MD        insulin  lispro (HumaLOG ) injection 0-6 Units  0-6 Units Subcutaneous ACHS Loletha Ripper, MD   1 Units at 12/01/23 8119    insulin  lispro (HumaLOG ) injection 7 Units  7 Units Subcutaneous 3xd Meals Lichtman, Ezra S, MD   7 Units at 12/01/23 0830    insulin  NPH (HumuLIN ,NovoLIN ) injection 17 Units  17 Units Subcutaneous Q12H [facility] Lichtman, Ezra S, MD   17 Units at 12/01/23 0831    IP OKAY TO TREAT   Other Continuous PRN Stacey Dyke, MD        IP OKAY TO TREAT  Other Continuous PRN Stacey Dyke, MD        labetalol  (NORMODYNE ) injection  10 mg Intravenous Q6H PRN Teressa Ferdinand, MD   10 mg at 11/29/23 1640    letermovir  (PREVYMIS ) tablet 480 mg  480 mg Oral Daily Daphene Dys, MD   480 mg at 11/30/23 2021    lidocaine  (ASPERCREME) 4 % 1 patch  1 patch Transdermal Daily Zewdu, Winthrop Hawks, MD        losartan  (COZAAR ) tablet 25 mg  25 mg Oral Daily Rudloff, Michael W, MD   25 mg at 12/01/23 2956    metoclopramide  (REGLAN ) injection 10 mg  10 mg Intravenous Q6H PRN Cornett, Fletcher Humble, MD   10 mg at 11/30/23 2130    midazolam  (VERSED ) injection 2 mg  2 mg Intravenous Once PRN Superdock, Michael A, MD        oxyCODONE  (ROXICODONE ) immediate release tablet 5 mg  5 mg Oral Q4H PRN Cookmeyer, Helga Loan, MD        senna (SENOKOT) tablet 2 tablet  2 tablet Oral Nightly PRN Cornett, Fletcher Humble, MD        Or    polyethylene glycol (MIRALAX ) packet 17 g  17 g Oral Daily PRN Cornett, Fletcher Humble, MD        PONATinib  (ICLUSIG ) tablet 30 mg PATIENT SUPPLIED  30 mg Oral Daily Stacey Dyke, MD   30 mg at 11/30/23 2129    prochlorperazine  (COMPAZINE ) tablet 10 mg  10 mg Oral Q6H PRN Avery, Natalia, DO        Or    prochlorperazine  (COMPAZINE ) injection 10 mg  10 mg Intravenous Q6H PRN Avery, Natalia, DO   10 mg at 11/27/23 1847    valACYclovir  (VALTREX ) tablet 500 mg  500 mg Oral Daily Teressa Ferdinand, MD   500 mg at 11/30/23 2021       Physical Exam:  General: Resting, in no apparent distress, lying in bed  HEENT:  PERRL. EOMI. Normocephalic, atraumatic  Heart:  RRR, no m/r/g appreciated  Lungs:  Normal WOB on room air. CTAB.  Abdomen:  No distention or pain on palpation.    Skin:  No lesions on clothed exam. LP (5/1) site with Band-Aid without erythema/swelling/ecchymosis  Psychiatric:  Range of affect is appropriate.    Neurologic:  Alert and oriented to person, place, time and situation.  CNII-CNXII grossly intact.  Extremities:  Appear well-perfused. No clubbing, edema, or cyanosis.      Labs:  Recent Labs     11/30/23  0044 12/01/23  0617 12/02/23  0617   WBC 11.0 9.9 10.5   NEUTROABS 9.9* 8.7* 8.8*   LYMPHSABS 0.7* 0.7* 0.9*   HGB 11.8 13.0 13.2   HCT 35.5 37.7 38.8   PLT 225 214 203   CREATININE 0.57 0.55 0.57   BUN 28* 27* 26*   BILITOT 0.6 0.6 0.7   AST 18 22 38*   ALT 252* 205* 192*   ALKPHOS 148* 147* 141*   K 4.3 4.1 4.1   MG 2.0 2.0 2.1   CALCIUM  10.8* 10.2 10.6*   NA 139 140 143   CL 102 104 104   CO2 28.0 28.0 28.0   PHOS 4.8 4.4 3.6   INR 1.05 1.05 1.04       Imaging:  No new.      Dareen Ebbing, [employer] 

## 2023-12-02 NOTE — Unmapped (Signed)
 Patient aox4, no pain reported this shift. Patient hypertensive this shift; parameters not met for prn labetalol . All other VSS. ACHS blood sugar check completed; meal time insulin  given per orders. No acute events this shift. Call bell in reach.   Problem: Adult Inpatient Plan of Care  Goal: Plan of Care Review  Outcome: Progressing  Goal: Patient-Specific Goal (Individualized)  Outcome: Progressing  Goal: Absence of Hospital-Acquired Illness or Injury  Outcome: Progressing  Intervention: Identify and Manage Fall Risk  Recent Flowsheet Documentation  Taken 12/02/2023 0824 by Martell Skinner, RN  Safety Interventions:   fall reduction program maintained   isolation precautions   infection management   lighting adjusted for tasks/safety   low bed   nonskid shoes/slippers when out of bed  Intervention: Prevent Skin Injury  Recent Flowsheet Documentation  Taken 12/02/2023 0824 by Martell Skinner, RN  Positioning for Skin: Supine/Back  Device Skin Pressure Protection: adhesive use limited  Skin Protection: adhesive use limited  Intervention: Prevent Infection  Recent Flowsheet Documentation  Taken 12/02/2023 0824 by Martell Skinner, RN  Infection Prevention:   cohorting utilized   environmental surveillance performed   equipment surfaces disinfected   personal protective equipment utilized   rest/sleep promoted   hand hygiene promoted   single patient room provided   visitors restricted/screened  Goal: Optimal Comfort and Wellbeing  Outcome: Progressing  Goal: Readiness for Transition of Care  Outcome: Progressing  Goal: Rounds/Family Conference  Outcome: Progressing     Problem: Fall Injury Risk  Goal: Absence of Fall and Fall-Related Injury  Outcome: Progressing  Intervention: Promote Injury-Free Environment  Recent Flowsheet Documentation  Taken 12/02/2023 0824 by Martell Skinner, RN  Safety Interventions:   fall reduction program maintained   isolation precautions   infection management   lighting adjusted for tasks/safety   low bed   nonskid shoes/slippers when out of bed     Problem: Comorbidity Management  Goal: Maintenance of Asthma Control  Outcome: Progressing  Goal: Maintenance of Behavioral Health Symptom Control  Outcome: Progressing  Goal: Maintenance of COPD Symptom Control  Outcome: Progressing  Goal: Blood Glucose Levels Within Targeted Range  Outcome: Progressing  Goal: Maintenance of Heart Failure Symptom Control  Outcome: Progressing  Goal: Blood Pressure in Desired Range  Outcome: Progressing  Goal: Maintenance of Osteoarthritis Symptom Control  Outcome: Progressing  Goal: Bariatric Home Regimen Maintained  Outcome: Progressing  Goal: Maintenance of Seizure Control  Outcome: Progressing     Problem: Pain Acute  Goal: Optimal Pain Control and Function  Outcome: Progressing

## 2023-12-02 NOTE — Unmapped (Signed)
 Pt slept on & off thru the night. Blood sugar & BP monitored. Pt reports of muscle spasm but does need need any medication at the moment. Promoted rest. Call bell within rest.     Problem: Adult Inpatient Plan of Care  Goal: Plan of Care Review  Outcome: Progressing  Goal: Patient-Specific Goal (Individualized)  Outcome: Progressing  Goal: Absence of Hospital-Acquired Illness or Injury  Outcome: Progressing  Intervention: Identify and Manage Fall Risk  Recent Flowsheet Documentation  Taken 12/01/2023 2130 by Lisa Rideau, RN  Safety Interventions:   lighting adjusted for tasks/safety   low bed  Intervention: Prevent Infection  Recent Flowsheet Documentation  Taken 12/01/2023 2130 by Lisa Rideau, RN  Infection Prevention:   cohorting utilized   rest/sleep promoted   hand hygiene promoted  Goal: Optimal Comfort and Wellbeing  Outcome: Progressing  Goal: Readiness for Transition of Care  Outcome: Progressing  Goal: Rounds/Family Conference  Outcome: Progressing     Problem: Fall Injury Risk  Goal: Absence of Fall and Fall-Related Injury  Outcome: Progressing  Intervention: Promote Scientist, clinical (histocompatibility and immunogenetics) Documentation  Taken 12/01/2023 2130 by Lisa Rideau, RN  Safety Interventions:   lighting adjusted for tasks/safety   low bed     Problem: Comorbidity Management  Goal: Maintenance of Asthma Control  Outcome: Progressing  Goal: Maintenance of Behavioral Health Symptom Control  Outcome: Progressing  Goal: Maintenance of COPD Symptom Control  Outcome: Progressing  Goal: Blood Glucose Levels Within Targeted Range  Outcome: Progressing  Goal: Maintenance of Heart Failure Symptom Control  Outcome: Progressing  Goal: Blood Pressure in Desired Range  Outcome: Progressing  Goal: Maintenance of Osteoarthritis Symptom Control  Outcome: Progressing  Goal: Bariatric Home Regimen Maintained  Outcome: Progressing  Goal: Maintenance of Seizure Control  Outcome: Progressing     Problem: Pain Acute  Goal: Optimal Pain Control and Function  Outcome: Progressing

## 2023-12-03 DIAGNOSIS — C9100 Acute lymphoblastic leukemia not having achieved remission: Principal | ICD-10-CM

## 2023-12-03 LAB — CBC W/ AUTO DIFF
BASOPHILS ABSOLUTE COUNT: 0 10*9/L (ref 0.0–0.1)
BASOPHILS RELATIVE PERCENT: 0.4 %
EOSINOPHILS ABSOLUTE COUNT: 0 10*9/L (ref 0.0–0.5)
EOSINOPHILS RELATIVE PERCENT: 0.1 %
HEMATOCRIT: 44 % (ref 34.0–44.0)
HEMOGLOBIN: 13.9 g/dL (ref 11.3–14.9)
LYMPHOCYTES ABSOLUTE COUNT: 1.1 10*9/L (ref 1.1–3.6)
LYMPHOCYTES RELATIVE PERCENT: 8.9 %
MEAN CORPUSCULAR HEMOGLOBIN CONC: 31.6 g/dL — ABNORMAL LOW (ref 32.0–36.0)
MEAN CORPUSCULAR HEMOGLOBIN: 30.2 pg (ref 25.9–32.4)
MEAN CORPUSCULAR VOLUME: 95.6 fL (ref 77.6–95.7)
MEAN PLATELET VOLUME: 8.7 fL (ref 6.8–10.7)
MONOCYTES ABSOLUTE COUNT: 0.8 10*9/L (ref 0.3–0.8)
MONOCYTES RELATIVE PERCENT: 6.7 %
NEUTROPHILS ABSOLUTE COUNT: 10 10*9/L — ABNORMAL HIGH (ref 1.8–7.8)
NEUTROPHILS RELATIVE PERCENT: 83.9 %
PLATELET COUNT: 211 10*9/L (ref 150–450)
RED BLOOD CELL COUNT: 4.6 10*12/L (ref 3.95–5.13)
RED CELL DISTRIBUTION WIDTH: 17.5 % — ABNORMAL HIGH (ref 12.2–15.2)
WBC ADJUSTED: 11.9 10*9/L — ABNORMAL HIGH (ref 3.6–11.2)

## 2023-12-03 LAB — PROTIME-INR
INR: 0.97
PROTIME: 11.1 s (ref 9.9–12.6)

## 2023-12-03 LAB — COMPREHENSIVE METABOLIC PANEL
ALBUMIN: 4.3 g/dL (ref 3.4–5.0)
ALKALINE PHOSPHATASE: 140 U/L — ABNORMAL HIGH (ref 46–116)
ALT (SGPT): 179 U/L — ABNORMAL HIGH (ref 10–49)
ANION GAP: 13 mmol/L (ref 5–14)
BILIRUBIN TOTAL: 1.1 mg/dL (ref 0.3–1.2)
BLOOD UREA NITROGEN: 29 mg/dL — ABNORMAL HIGH (ref 9–23)
BUN / CREAT RATIO: 54
CALCIUM: 10.4 mg/dL (ref 8.7–10.4)
CHLORIDE: 105 mmol/L (ref 98–107)
CO2: 26 mmol/L (ref 20.0–31.0)
CREATININE: 0.54 mg/dL — ABNORMAL LOW (ref 0.55–1.02)
EGFR CKD-EPI (2021) FEMALE: >90 mL/min/1.73m2 (ref >=60)
GLUCOSE RANDOM: 237 mg/dL — ABNORMAL HIGH (ref 70–179)
PROTEIN TOTAL: 7.3 g/dL (ref 5.7–8.2)
SODIUM: 144 mmol/L (ref 135–145)

## 2023-12-03 LAB — SLIDE REVIEW

## 2023-12-03 LAB — AST: AST (SGOT): 23 U/L (ref <=34)

## 2023-12-03 LAB — POTASSIUM: POTASSIUM: 4.5 mmol/L (ref 3.4–4.8)

## 2023-12-03 LAB — PHOSPHORUS: PHOSPHORUS: 3.4 mg/dL (ref 2.4–5.1)

## 2023-12-03 LAB — MAGNESIUM: MAGNESIUM: 1.9 mg/dL (ref 1.6–2.6)

## 2023-12-03 MED ADMIN — PONATinib (ICLUSIG) tablet 30 mg ***PATIENT SUPPLIED***: 30 mg | ORAL | @ 02:00:00

## 2023-12-03 MED ADMIN — dexAMETHasone (DECADRON) tablet 2 mg: 2 mg | ORAL | @ 22:00:00

## 2023-12-03 MED ADMIN — insulin lispro (HumaLOG) injection 8 Units: 8 [IU] | SUBCUTANEOUS | @ 22:00:00

## 2023-12-03 MED ADMIN — insulin NPH (HumuLIN,NovoLIN) injection 15 Units: 15 [IU] | SUBCUTANEOUS | @ 13:00:00 | Stop: 2023-12-03

## 2023-12-03 MED ADMIN — cyclobenzaprine (FLEXERIL) tablet 10 mg: 10 mg | ORAL | @ 08:00:00

## 2023-12-03 MED ADMIN — letermovir (PREVYMIS) tablet 480 mg: 480 mg | ORAL | @ 02:00:00 | Stop: 2023-12-25

## 2023-12-03 MED ADMIN — aspirin chewable tablet 81 mg: 81 mg | ORAL | @ 12:00:00

## 2023-12-03 MED ADMIN — losartan (COZAAR) tablet 25 mg: 25 mg | ORAL | @ 12:00:00 | Stop: 2023-12-03

## 2023-12-03 MED ADMIN — insulin lispro (HumaLOG) injection 7 Units: 7 [IU] | SUBCUTANEOUS | @ 16:00:00 | Stop: 2023-12-03

## 2023-12-03 MED ADMIN — cetirizine (ZYRTEC) tablet 10 mg: 10 mg | ORAL | @ 12:00:00

## 2023-12-03 MED ADMIN — losartan (COZAAR) tablet 25 mg: 25 mg | ORAL | @ 15:00:00 | Stop: 2023-12-03

## 2023-12-03 MED ADMIN — enoxaparin (LOVENOX) syringe 40 mg: 40 mg | SUBCUTANEOUS | @ 13:00:00 | Stop: 2023-12-03

## 2023-12-03 MED ADMIN — insulin lispro (HumaLOG) injection 0-6 Units: 0-6 [IU] | SUBCUTANEOUS | @ 16:00:00

## 2023-12-03 MED ADMIN — insulin lispro (HumaLOG) injection 0-6 Units: 0-6 [IU] | SUBCUTANEOUS | @ 22:00:00

## 2023-12-03 MED ADMIN — escitalopram oxalate (LEXAPRO) tablet 10 mg: 10 mg | ORAL | @ 12:00:00 | Stop: 2023-12-25

## 2023-12-03 MED ADMIN — insulin lispro (HumaLOG) injection 0-6 Units: 0-6 [IU] | SUBCUTANEOUS | @ 13:00:00

## 2023-12-03 MED ADMIN — insulin NPH (HumuLIN,NovoLIN) injection 15 Units: 15 [IU] | SUBCUTANEOUS | @ 02:00:00

## 2023-12-03 MED ADMIN — valACYclovir (VALTREX) tablet 500 mg: 500 mg | ORAL | @ 02:00:00 | Stop: 2023-12-24

## 2023-12-03 MED ADMIN — dapsone tablet 100 mg: 100 mg | ORAL | @ 12:00:00 | Stop: 2023-12-25

## 2023-12-03 MED ADMIN — insulin lispro (HumaLOG) injection 0-6 Units: 0-6 [IU] | SUBCUTANEOUS | @ 02:00:00

## 2023-12-03 MED ADMIN — dexAMETHasone (DECADRON) tablet 2 mg: 2 mg | ORAL | @ 10:00:00

## 2023-12-03 MED ADMIN — insulin lispro (HumaLOG) injection 7 Units: 7 [IU] | SUBCUTANEOUS | @ 13:00:00 | Stop: 2023-12-03

## 2023-12-03 NOTE — Unmapped (Signed)
 Problem: Adult Inpatient Plan of Care  Goal: Plan of Care Review  Outcome: Progressing  Goal: Patient-Specific Goal (Individualized)  Outcome: Progressing  Goal: Absence of Hospital-Acquired Illness or Injury  Outcome: Progressing  Intervention: Identify and Manage Fall Risk  Recent Flowsheet Documentation  Taken 12/02/2023 2000 by Albertha Huger, RN  Safety Interventions:   fall reduction program maintained   low bed   lighting adjusted for tasks/safety   nonskid shoes/slippers when out of bed  Intervention: Prevent Skin Injury  Recent Flowsheet Documentation  Taken 12/02/2023 2000 by Albertha Huger, RN  Positioning for Skin: Supine/Back  Device Skin Pressure Protection: absorbent pad utilized/changed  Skin Protection: adhesive use limited  Goal: Optimal Comfort and Wellbeing  Outcome: Progressing  Goal: Readiness for Transition of Care  Outcome: Progressing  Goal: Rounds/Family Conference  Outcome: Progressing     Problem: Fall Injury Risk  Goal: Absence of Fall and Fall-Related Injury  Outcome: Progressing  Intervention: Promote Scientist, clinical (histocompatibility and immunogenetics) Documentation  Taken 12/02/2023 2000 by Albertha Huger, RN  Safety Interventions:   fall reduction program maintained   low bed   lighting adjusted for tasks/safety   nonskid shoes/slippers when out of bed     Problem: Comorbidity Management  Goal: Maintenance of Asthma Control  Outcome: Progressing  Goal: Maintenance of Behavioral Health Symptom Control  Outcome: Progressing  Goal: Maintenance of COPD Symptom Control  Outcome: Progressing  Goal: Blood Glucose Levels Within Targeted Range  Outcome: Progressing  Goal: Maintenance of Heart Failure Symptom Control  Outcome: Progressing  Goal: Blood Pressure in Desired Range  Outcome: Progressing  Goal: Maintenance of Osteoarthritis Symptom Control  Outcome: Progressing  Intervention: Maintain Osteoarthritis Symptom Control  Recent Flowsheet Documentation  Taken 12/02/2023 2000 by Albertha Huger, RN  Activity Management: up ad lib  Goal: Bariatric Home Regimen Maintained  Outcome: Progressing  Goal: Maintenance of Seizure Control  Outcome: Progressing     Problem: Pain Acute  Goal: Optimal Pain Control and Function  Outcome: Progressing

## 2023-12-03 NOTE — Unmapped (Signed)
 Daily Progress Note      Principal Problem:    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)  Active Problems:    Blurred vision, bilateral    Hypertension    Headache    Metastasis to brain    Drug-induced injury of liver    Type 2 diabetes mellitus, with long-term current use of insulin     Hyperlipidemia       LOS: 9 days     Assessment/Plan:Patricia [name] is an 48 y.o. 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 Started on IT methotrexate  and dexamethasone .     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 Started on biweekly IT methotrexate  and dexamethasone . Bone marrow biopsy recommend to patient for restaging disease.  BM biopsy (5/2)  Flow MRD and Clonoseq pending  Mon/Thurs schedule for LP with IT MTX. Next due 5/5. ProcedureTeam made aware.   Headache: dexamethasone  2mg  q12h (weaned 5/3)  Plan to stop dexamethasone  on 5/5  PRN metoclopramide  & PRN diphenhydramine   Ponatanib restarted 5/1 due to disease progression  Monitor LFTs daily  Continue ASA 81mg  daily for arterial clot ppx  Continue enoxaparin  40mg  subQ for DVT ppx (hold day of LP)  Prophylaxis:valtrex , Prevymis     Hypertension  History of chronic hypertension. Uptitrated to home dosage of Losartan  50 mg 5/4.   Increased PO Losartan  50 mg (home dose)  Continue Labetalol  PRN SBP>180 or DBP>120     Acute Liver Injury - C/f Tylenol  Toxicity  On admission found to have acutely elevated  ALT and AST. Liver doppler 4/26 without signs of veno-occlusive leukemic infiltration or portal vein thrombosis. Acute hepatitis panel negative. HIV negative. Trending labs with daily improvement.   Trend hepatic function panel   Trend PT-INR, CBC/Diff, CMP/Mg/Phos  Monitor LFTs after ponatanib resumption     T2DM  Suspected elevation in BG during course of steroids. Will CTM with steriod taper.   NPH 15u BID   Increased SSI to SF 30  Lispro 7u AC TID    Neck pain  CT of Cervical spine at OSH hospital showed degenerative disc disease at C5-C6 with no spinal cord compression. Patient states she has increased pain in her neck from lying in bed all day. It improves with movement and muscle relaxers. No numbness or tingling. Improves with voltaren  gel and cold packs. Will continue to monitor symptoms   - Voltaren  gel prn   - Ice Packs   - PRN Flexeril       Chronic Problems  HLD: HOLD rosuvastatin  10 mg nightly     Impending Electrolyte Abnormality Secondary to Chemotherapy and/or IV Fluids  -Daily Electrolyte monitoring  -Replete per [facility] guidelines.     Immunocompromised status: Patient is immunocompromised secondary to disease and chemotherapy  -Antimicrobial prophylaxis as above    Impending Pancytopenia secondary to Acute Leukemia and chemotherapy:   - Transfuse hgb <7  - Transfuse plt <10K    Nutrition: Diabetic                        Subjective:     Interval History:  [name]. Patient feeling well. Understanding of the plan to have IT chemotherapy tomorrow.     10  point ROS otherwise negative except as above in the HPI.     Objective:     Vital signs in last 24 hours:  Temp:  [36.4 ??C (97.5 ??F)-36.8 ??C (98.2 ??F)] 36.5 ??C (97.7 ??F)  Pulse:  [78-128] 128  Resp:  [16-18] 18  BP: (147-183)/(107-129) 149/129  MAP (mmHg):  [123-137] 137  SpO2:  [95 %-97 %] 97 %    Intake/Output last 3 shifts:  No intake/output data recorded.    Meds:  Current Facility-Administered Medications   Medication Dose Route Frequency Provider Last Rate Last Admin    albuterol  (PROVENTIL  HFA;VENTOLIN  HFA) 90 mcg/actuation inhaler 2 puff  2 puff Inhalation Q6H PRN Daphene Dys, MD        aspirin  chewable tablet 81 mg  81 mg Oral Daily Kitay, Ilona, MD   81 mg at 12/01/23 1048    bismuth  subsalicylate (PEPTO-BISMOL) oral suspension  30 mL Oral Q6H PRN Rudloff, Michael W, MD   30 mL at 11/25/23 1648    cetirizine  (ZYRTEC ) tablet 10 mg  10 mg Oral Daily Rudloff, Michael W, MD   10 mg at 12/01/23 2130    CHEMO CLARIFICATION ORDER   Other Continuous PRN Stacey Dyke, MD        CHEMO CLARIFICATION ORDER   Other Continuous PRN Stacey Dyke, MD        cyclobenzaprine  (FLEXERIL ) tablet 10 mg  10 mg Oral TID PRN Tiffani Kadow C, DO   10 mg at 11/30/23 2019    dapsone  tablet 100 mg  100 mg Oral Daily Daphene Dys, MD   100 mg at 12/01/23 8657    dexAMETHasone  (DECADRON ) tablet 4 mg  4 mg Oral Q12H SCH Kitay, Ilona, MD        dextrose  50 % in water (D50W) 50 % solution 12.5 g  12.5 g Intravenous Q15 Min PRN Cookmeyer, Helga Loan, MD        diclofenac  sodium (VOLTAREN ) 1 % gel 4 g  4 g Topical QID Loletha Ripper, MD   4 g at 11/29/23 1133    diphenhydrAMINE  (BENADRYL ) injection  25 mg Intravenous Q6H PRN Cornett, Fletcher Humble, MD   25 mg at 11/30/23 0858    enoxaparin  (LOVENOX ) syringe 40 mg  40 mg Subcutaneous Q24H [facility] Kitay, Ilona, MD   40 mg at 12/01/23 1048    escitalopram  oxalate (LEXAPRO ) tablet 10 mg  10 mg Oral Daily Daphene Dys, MD   10 mg at 12/01/23 8469    glucagon injection 1 mg  1 mg Intramuscular Once PRN Cookmeyer, Helga Loan, MD        glucose chewable tablet 16 g  16 g Oral Q10 Min PRN Cookmeyer, Helga Loan, MD        insulin  lispro (HumaLOG ) injection 0-6 Units  0-6 Units Subcutaneous ACHS Loletha Ripper, MD   1 Units at 12/01/23 6295    insulin  lispro (HumaLOG ) injection 7 Units  7 Units Subcutaneous 3xd Meals Lichtman, Ezra S, MD   7 Units at 12/01/23 0830    insulin  NPH (HumuLIN ,NovoLIN ) injection 17 Units  17 Units Subcutaneous Q12H [facility] Lichtman, Ezra S, MD   17 Units at 12/01/23 0831    IP OKAY TO TREAT   Other Continuous PRN Stacey Dyke, MD        IP OKAY TO TREAT   Other Continuous PRN Stacey Dyke, MD        labetalol  (NORMODYNE ) injection  10  mg Intravenous Q6H PRN Teressa Ferdinand, MD   10 mg at 11/29/23 1640    letermovir  (PREVYMIS ) tablet 480 mg  480 mg Oral Daily Daphene Dys, MD   480 mg at 11/30/23 2021    lidocaine  (ASPERCREME) 4 % 1 patch  1 patch Transdermal Daily Zewdu, Winthrop Hawks, MD        losartan  (COZAAR ) tablet 25 mg  25 mg Oral Daily Rudloff, Michael W, MD   25 mg at 12/01/23 1610    metoclopramide  (REGLAN ) injection 10 mg  10 mg Intravenous Q6H PRN Cornett, Fletcher Humble, MD   10 mg at 11/30/23 9604    midazolam  (VERSED ) injection 2 mg  2 mg Intravenous Once PRN Superdock, Michael A, MD        oxyCODONE  (ROXICODONE ) immediate release tablet 5 mg  5 mg Oral Q4H PRN Cookmeyer, Helga Loan, MD        senna (SENOKOT) tablet 2 tablet  2 tablet Oral Nightly PRN Cornett, Fletcher Humble, MD        Or    polyethylene glycol (MIRALAX ) packet 17 g  17 g Oral Daily PRN Cornett, Fletcher Humble, MD        PONATinib  (ICLUSIG ) tablet 30 mg PATIENT SUPPLIED  30 mg Oral Daily Stacey Dyke, MD   30 mg at 11/30/23 2129    prochlorperazine  (COMPAZINE ) tablet 10 mg  10 mg Oral Q6H PRN Avery, Natalia, DO        Or    prochlorperazine  (COMPAZINE ) injection 10 mg  10 mg Intravenous Q6H PRN Avery, Natalia, DO   10 mg at 11/27/23 1847    valACYclovir  (VALTREX ) tablet 500 mg  500 mg Oral Daily Teressa Ferdinand, MD   500 mg at 11/30/23 2021       Physical Exam:  General: Resting, in no apparent distress, lying in bed  HEENT:  PERRL. EOMI. Normocephalic, atraumatic  Heart:  RRR, no m/r/g appreciated  Lungs:  Normal WOB on room air. CTAB.  Abdomen:  No distention or pain on palpation.    Skin:  No lesions on clothed exam.   Psychiatric:  Range of affect is appropriate.    Neurologic:  Alert and oriented to person, place, time and situation.  CNII-CNXII grossly intact.  Extremities:  Appear well-perfused. No clubbing, edema, or cyanosis.      Labs:  Recent Labs     12/01/23  0617 12/02/23  0617 12/03/23  0357   WBC 9.9 10.5 11.9*   NEUTROABS 8.7* 8.8* 10.0*   LYMPHSABS 0.7* 0.9* 1.1   HGB 13.0 13.2 13.9   HCT 37.7 38.8 44.0   PLT 214 203 211   CREATININE 0.55 0.57 0.54*   BUN 27* 26* 29*   BILITOT 0.6 0.7 1.1   AST 22 38*  --    ALT 205* 192* 179*   ALKPHOS 147* 141* 140*   K 4.1 4.1  --    MG 2.0 2.1 1.9   CALCIUM  10.2 10.6* 10.4   NA 140 143 144   CL 104 104 105   CO2 28.0 28.0 26.0   PHOS 4.4 3.6 3.4   INR 1.05 1.04 0.97       Imaging:  No new.      Dareen Ebbing, [employer] [place] 

## 2023-12-04 DIAGNOSIS — C9100 Acute lymphoblastic leukemia not having achieved remission: Principal | ICD-10-CM

## 2023-12-04 DIAGNOSIS — D801 Nonfamilial hypogammaglobulinemia: Principal | ICD-10-CM

## 2023-12-04 LAB — COMPREHENSIVE METABOLIC PANEL
ALBUMIN: 3.6 g/dL (ref 3.4–5.0)
ALKALINE PHOSPHATASE: 122 U/L — ABNORMAL HIGH (ref 46–116)
ALT (SGPT): 114 U/L — ABNORMAL HIGH (ref 10–49)
ANION GAP: 11 mmol/L (ref 5–14)
AST (SGOT): 21 U/L (ref <=34)
BILIRUBIN TOTAL: 0.7 mg/dL (ref 0.3–1.2)
BLOOD UREA NITROGEN: 30 mg/dL — ABNORMAL HIGH (ref 9–23)
BUN / CREAT RATIO: 68
CALCIUM: 9.1 mg/dL (ref 8.7–10.4)
CHLORIDE: 107 mmol/L (ref 98–107)
CO2: 24 mmol/L (ref 20.0–31.0)
CREATININE: 0.44 mg/dL — ABNORMAL LOW (ref 0.55–1.02)
EGFR CKD-EPI (2021) FEMALE: >90 mL/min/1.73m2 (ref >=60)
GLUCOSE RANDOM: 293 mg/dL — ABNORMAL HIGH (ref 70–179)
POTASSIUM: 4.1 mmol/L (ref 3.4–4.8)
PROTEIN TOTAL: 6 g/dL (ref 5.7–8.2)
SODIUM: 142 mmol/L (ref 135–145)

## 2023-12-04 LAB — SLIDE REVIEW

## 2023-12-04 LAB — CBC W/ AUTO DIFF
BASOPHILS ABSOLUTE COUNT: 0 10*9/L (ref 0.0–0.1)
BASOPHILS RELATIVE PERCENT: 0.3 %
EOSINOPHILS ABSOLUTE COUNT: 0 10*9/L (ref 0.0–0.5)
EOSINOPHILS RELATIVE PERCENT: 0.3 %
HEMATOCRIT: 39.5 % (ref 34.0–44.0)
HEMOGLOBIN: 13.5 g/dL (ref 11.3–14.9)
LYMPHOCYTES ABSOLUTE COUNT: 1.2 10*9/L (ref 1.1–3.6)
LYMPHOCYTES RELATIVE PERCENT: 9.8 %
MEAN CORPUSCULAR HEMOGLOBIN CONC: 34.1 g/dL (ref 32.0–36.0)
MEAN CORPUSCULAR HEMOGLOBIN: 33.1 pg — ABNORMAL HIGH (ref 25.9–32.4)
MEAN CORPUSCULAR VOLUME: 97.2 fL — ABNORMAL HIGH (ref 77.6–95.7)
MEAN PLATELET VOLUME: 9.2 fL (ref 6.8–10.7)
MONOCYTES ABSOLUTE COUNT: 0.9 10*9/L — ABNORMAL HIGH (ref 0.3–0.8)
MONOCYTES RELATIVE PERCENT: 7.4 %
NEUTROPHILS ABSOLUTE COUNT: 9.9 10*9/L — ABNORMAL HIGH (ref 1.8–7.8)
NEUTROPHILS RELATIVE PERCENT: 82.2 %
PLATELET COUNT: 179 10*9/L (ref 150–450)
RED BLOOD CELL COUNT: 4.07 10*12/L (ref 3.95–5.13)
RED CELL DISTRIBUTION WIDTH: 17.5 % — ABNORMAL HIGH (ref 12.2–15.2)
WBC ADJUSTED: 12.1 10*9/L — ABNORMAL HIGH (ref 3.6–11.2)

## 2023-12-04 LAB — HEMATOPATHOLOGY LEUKEMIA/LYMPHOMA FLOW CYTOMETRY, CSF
LYMPHS CSF: 45 %
MONO/MACROPHAGE CSF: 8 %
NEUTROPHILS, CSF: 2 %
NUCLEATED CELLS, CSF: 9 ul — ABNORMAL HIGH (ref <=5)
NUMBER OF CELLS CSF: 100
OTHER  CELLS CSF: 45 %
RBC CSF: 34 ul — ABNORMAL HIGH (ref <2)

## 2023-12-04 LAB — PROTIME-INR
INR: 1.04
PROTIME: 11.9 s (ref 9.9–12.6)

## 2023-12-04 LAB — MAGNESIUM: MAGNESIUM: 1.8 mg/dL (ref 1.6–2.6)

## 2023-12-04 LAB — PHOSPHORUS: PHOSPHORUS: 3.6 mg/dL (ref 2.4–5.1)

## 2023-12-04 MED ADMIN — cyclobenzaprine (FLEXERIL) tablet 10 mg: 10 mg | ORAL | @ 01:00:00

## 2023-12-04 MED ADMIN — insulin lispro (HumaLOG) injection 0-6 Units: 0-6 [IU] | SUBCUTANEOUS | @ 18:00:00

## 2023-12-04 MED ADMIN — psyllium (METAMUCIL) packet 1 packet: 1 | ORAL

## 2023-12-04 MED ADMIN — cyclobenzaprine (FLEXERIL) tablet 10 mg: 10 mg | ORAL | @ 15:00:00

## 2023-12-04 MED ADMIN — escitalopram oxalate (LEXAPRO) tablet 10 mg: 10 mg | ORAL | @ 13:00:00 | Stop: 2023-12-25

## 2023-12-04 MED ADMIN — insulin lispro (HumaLOG) injection 0-6 Units: 0-6 [IU] | SUBCUTANEOUS | @ 02:00:00

## 2023-12-04 MED ADMIN — insulin lispro (HumaLOG) injection 0-6 Units: 0-6 [IU] | SUBCUTANEOUS | @ 13:00:00

## 2023-12-04 MED ADMIN — insulin lispro (HumaLOG) injection 0-6 Units: 0-6 [IU] | SUBCUTANEOUS | @ 22:00:00

## 2023-12-04 MED ADMIN — midazolam (VERSED) injection 1 mg: 1 mg | INTRAVENOUS | @ 18:00:00 | Stop: 2023-12-04

## 2023-12-04 MED ADMIN — cetirizine (ZYRTEC) tablet 10 mg: 10 mg | ORAL | @ 13:00:00

## 2023-12-04 MED ADMIN — dapsone tablet 100 mg: 100 mg | ORAL | @ 13:00:00 | Stop: 2023-12-25

## 2023-12-04 MED ADMIN — aspirin chewable tablet 81 mg: 81 mg | ORAL | @ 13:00:00

## 2023-12-04 MED ADMIN — losartan (COZAAR) tablet 50 mg: 50 mg | ORAL | @ 13:00:00

## 2023-12-04 MED ADMIN — letermovir (PREVYMIS) tablet 480 mg: 480 mg | ORAL | Stop: 2023-12-25

## 2023-12-04 MED ADMIN — PONATinib (ICLUSIG) tablet 30 mg ***PATIENT SUPPLIED***: 30 mg | ORAL

## 2023-12-04 MED ADMIN — insulin NPH (HumuLIN,NovoLIN) injection 17 Units: 17 [IU] | SUBCUTANEOUS | @ 02:00:00

## 2023-12-04 MED ADMIN — methotrexate (Preservative Free) 12 mg, hydrocortisone sod succ (Solu-CORTEF) 50 mg in sodium chloride (NS) 0.9 % 6 mL INTRATHECAL syringe: INTRATHECAL | @ 18:00:00 | Stop: 2023-12-04

## 2023-12-04 MED ADMIN — insulin NPH (HumuLIN,NovoLIN) injection 17 Units: 17 [IU] | SUBCUTANEOUS | @ 15:00:00

## 2023-12-04 MED ADMIN — valACYclovir (VALTREX) tablet 500 mg: 500 mg | ORAL | Stop: 2023-12-24

## 2023-12-04 NOTE — Unmapped (Signed)
 Pt A&Ox4. Afebrile, tachycardiac, other VSS on room air. Blood glucose monitored, SSI and NPH given per orders. Metamucil given once for constipation. PRN flexeril  given once for muscle spasms. Denies any n/v/d. Pt free from falls/injuries this shift.     Problem: Adult Inpatient Plan of Care  Goal: Plan of Care Review  Outcome: Ongoing - Unchanged  Goal: Patient-Specific Goal (Individualized)  Outcome: Ongoing - Unchanged  Goal: Absence of Hospital-Acquired Illness or Injury  Outcome: Ongoing - Unchanged  Intervention: Identify and Manage Fall Risk  Recent Flowsheet Documentation  Taken 12/03/2023 2028 by Idelle Majors, RN  Safety Interventions:   environmental modification   fall reduction program maintained   infection management   isolation precautions   lighting adjusted for tasks/safety   low bed   neutropenic precautions   nonskid shoes/slippers when out of bed  Intervention: Prevent Infection  Recent Flowsheet Documentation  Taken 12/03/2023 2028 by Idelle Majors, RN  Infection Prevention:   cohorting utilized   environmental surveillance performed   equipment surfaces disinfected   hand hygiene promoted   personal protective equipment utilized   rest/sleep promoted   single patient room provided   visitors restricted/screened  Goal: Optimal Comfort and Wellbeing  Outcome: Ongoing - Unchanged  Goal: Readiness for Transition of Care  Outcome: Ongoing - Unchanged  Goal: Rounds/Family Conference  Outcome: Ongoing - Unchanged     Problem: Fall Injury Risk  Goal: Absence of Fall and Fall-Related Injury  Outcome: Ongoing - Unchanged  Intervention: Promote Injury-Free Environment  Recent Flowsheet Documentation  Taken 12/03/2023 2028 by Idelle Majors, RN  Safety Interventions:   environmental modification   fall reduction program maintained   infection management   isolation precautions   lighting adjusted for tasks/safety   low bed   neutropenic precautions   nonskid shoes/slippers when out of bed Problem: Comorbidity Management  Goal: Maintenance of Asthma Control  Outcome: Ongoing - Unchanged  Goal: Maintenance of Behavioral Health Symptom Control  Outcome: Ongoing - Unchanged  Goal: Maintenance of COPD Symptom Control  Outcome: Ongoing - Unchanged  Goal: Blood Glucose Levels Within Targeted Range  Outcome: Ongoing - Unchanged  Goal: Maintenance of Heart Failure Symptom Control  Outcome: Ongoing - Unchanged  Goal: Blood Pressure in Desired Range  Outcome: Ongoing - Unchanged  Goal: Maintenance of Osteoarthritis Symptom Control  Outcome: Ongoing - Unchanged  Goal: Bariatric Home Regimen Maintained  Outcome: Ongoing - Unchanged  Goal: Maintenance of Seizure Control  Outcome: Ongoing - Unchanged     Problem: Pain Acute  Goal: Optimal Pain Control and Function  Outcome: Ongoing - Unchanged

## 2023-12-04 NOTE — Unmapped (Signed)
 Daily Progress Note      Principal Problem:    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)  Active Problems:    Blurred vision, bilateral    Hypertension    Headache    Metastasis to brain    Drug-induced injury of liver    Type 2 diabetes mellitus, with long-term current use of insulin     Hyperlipidemia       LOS: 10 days     Assessment/Plan:Patricia [name] is an 48 y.o. 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 Started on IT methotrexate  and dexamethasone .     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 Started on biweekly IT methotrexate  and dexamethasone . Bone marrow biopsy 5/2 results pending.   Mon/Thurs schedule for LP with IT MTX. Next treatment today (5/5).    Next treatment will be 5/8  Lovenox  will restart after procedure  Headache: dexamethasone   STOPPED 5/5  PRN metoclopramide  & PRN diphenhydramine   Ponatanib restarted 5/1 due to disease progression  Monitor LFTs daily, improving  Continue ASA 81mg  daily for arterial clot ppx  Continue enoxaparin  40mg  subQ for DVT ppx (hold day of LP)  Prophylaxis:valtrex , Prevymis     Intermittent Tachycardia  Starting 5/4 patient having intermittent episodes of tachycardia (130-160 bpm). The episodes were found during vital checks and on telemetry. EKG x2 showed normal sinus rhythm with normal rate. Electrolytes are stable. Patient asymptomatic during episodes. She states she has not been drinking much water, but is eating adequately. Concerned for possible atrial arrhythmia.   - Telemetry 24 hrs  - K+ >4 Mag >2  - consider cardiology consult if persistent or become symptomatic    Hypertension  History of chronic hypertension. Uptitrated to home dosage of Losartan  50 mg 5/4.   Increased PO Losartan  50 mg (home dose)  Continue Labetalol  PRN SBP>180 or DBP>120     Acute Liver Injury - C/f Tylenol  Toxicity, improving   On admission found to have acutely elevated  ALT and AST. Liver doppler 4/26 without signs of veno-occlusive leukemic infiltration or portal vein thrombosis. Acute hepatitis panel negative. HIV negative. Trending labs with daily improvement.   Trend hepatic function panel   Trend PT-INR, CBC/Diff, CMP/Mg/Phos  Monitor LFTs after ponatanib resumption     T2DM  Suspected elevation in BG during course of steroids. Will CTM with steriod taper.   NPH 15u BID   Increased SSI to SF 30  STOP Lispro 7u AC TID - discontinued meal time as steroids are stopping.     Neck pain  CT of Cervical spine at OSH hospital showed degenerative disc disease at C5-C6 with no spinal cord compression. Patient states she has increased pain in her neck from lying in bed all day. It improves with movement and muscle relaxers. No numbness or tingling. Improves with voltaren  gel and cold packs. Will continue to monitor symptoms   - Voltaren  gel prn   - Ice Packs   - PRN Flexeril       Chronic Problems  HLD: HOLD rosuvastatin  10 mg nightly     Impending Electrolyte Abnormality Secondary to Chemotherapy and/or IV Fluids  -Daily Electrolyte monitoring  -Replete per [facility] guidelines.     Immunocompromised status: Patient is immunocompromised  secondary to disease and chemotherapy  -Antimicrobial prophylaxis as above    Impending Pancytopenia secondary to Acute Leukemia and chemotherapy:   - Transfuse hgb <7  - Transfuse plt <10K    Nutrition: Diabetic                        Subjective:     Interval History:  [name]. Feels well this morning. Denies chest pain, shortness of breath. Frustrated with her insurance.     10 point ROS otherwise negative except as above in the HPI.     Objective:     Vital signs in last 24 hours:  Temp:  [36.5 ??C (97.7 ??F)-36.7 ??C (98.1 ??F)] 36.5 ??C (97.7 ??F)  Pulse:  [94-140] 94  Resp:  [16-19] 19  BP: (124-148)/(97-116) 124/102  MAP (mmHg):  [106-127] 112  SpO2:  [95 %-99 %] 99 %    Intake/Output last 3 shifts:  No intake/output data recorded.    Meds:  Current Facility-Administered Medications   Medication Dose Route Frequency Provider Last Rate Last Admin    albuterol  (PROVENTIL  HFA;VENTOLIN  HFA) 90 mcg/actuation inhaler 2 puff  2 puff Inhalation Q6H PRN Daphene Dys, MD        aspirin  chewable tablet 81 mg  81 mg Oral Daily Kitay, Ilona, MD   81 mg at 12/01/23 1048    bismuth  subsalicylate (PEPTO-BISMOL) oral suspension  30 mL Oral Q6H PRN Rudloff, Michael W, MD   30 mL at 11/25/23 1648    cetirizine  (ZYRTEC ) tablet 10 mg  10 mg Oral Daily Rudloff, Michael W, MD   10 mg at 12/01/23 1610    CHEMO CLARIFICATION ORDER   Other Continuous PRN Stacey Dyke, MD        CHEMO CLARIFICATION ORDER   Other Continuous PRN Stacey Dyke, MD        cyclobenzaprine  (FLEXERIL ) tablet 10 mg  10 mg Oral TID PRN Lindsay Rho, Barclay Lennox C, DO   10 mg at 11/30/23 2019    dapsone  tablet 100 mg  100 mg Oral Daily Daphene Dys, MD   100 mg at 12/01/23 9604    dexAMETHasone  (DECADRON ) tablet 4 mg  4 mg Oral Q12H SCH Kitay, Ilona, MD        dextrose  50 % in water (D50W) 50 % solution 12.5 g  12.5 g Intravenous Q15 Min PRN Cookmeyer, Helga Loan, MD        diclofenac  sodium (VOLTAREN ) 1 % gel 4 g  4 g Topical QID Loletha Ripper, MD   4 g at 11/29/23 1133    diphenhydrAMINE  (BENADRYL ) injection  25 mg Intravenous Q6H PRN Cornett, Fletcher Humble, MD   25 mg at 11/30/23 0858    enoxaparin  (LOVENOX ) syringe 40 mg  40 mg Subcutaneous Q24H [facility] Kitay, Ilona, MD   40 mg at 12/01/23 1048    escitalopram  oxalate (LEXAPRO ) tablet 10 mg  10 mg Oral Daily Daphene Dys, MD   10 mg at 12/01/23 5409    glucagon injection 1 mg  1 mg Intramuscular Once PRN Cookmeyer, Helga Loan, MD        glucose chewable tablet 16 g  16 g Oral Q10 Min PRN Cookmeyer, Helga Loan, MD        insulin  lispro (HumaLOG ) injection 0-6 Units  0-6 Units Subcutaneous ACHS Loletha Ripper, MD   1 Units at 12/01/23 8119    insulin  lispro (HumaLOG ) injection 7 Units  7 Units  Subcutaneous 3xd Meals Lichtman, Ezra S, MD   7 Units at 12/01/23 0830    insulin  NPH (HumuLIN ,NovoLIN ) injection 17 Units  17 Units Subcutaneous Q12H [facility] Lichtman, Ezra S, MD   17 Units at 12/01/23 0831    IP OKAY TO TREAT   Other Continuous PRN Stacey Dyke, MD        IP OKAY TO TREAT   Other Continuous PRN Stacey Dyke, MD        labetalol  (NORMODYNE ) injection  10 mg Intravenous Q6H PRN Teressa Ferdinand, MD   10 mg at 11/29/23 1640    letermovir  (PREVYMIS ) tablet 480 mg  480 mg Oral Daily Daphene Dys, MD   480 mg at 11/30/23 2021    lidocaine  (ASPERCREME) 4 % 1 patch  1 patch Transdermal Daily Zewdu, Winthrop Hawks, MD        losartan  (COZAAR ) tablet 25 mg  25 mg Oral Daily Rudloff, Michael W, MD   25 mg at 12/01/23 0833    metoclopramide  (REGLAN ) injection 10 mg  10 mg Intravenous Q6H PRN Cornett, Fletcher Humble, MD   10 mg at 11/30/23 1610    midazolam  (VERSED ) injection 2 mg  2 mg Intravenous Once PRN Beverlyn Buckles, MD        oxyCODONE  (ROXICODONE ) immediate release tablet 5 mg  5 mg Oral Q4H PRN Cookmeyer, Helga Loan, MD        senna (SENOKOT) tablet 2 tablet  2 tablet Oral Nightly PRN Cornett, Fletcher Humble, MD        Or    polyethylene glycol (MIRALAX ) packet 17 g  17 g Oral Daily PRN Cornett, Fletcher Humble, MD        PONATinib  (ICLUSIG ) tablet 30 mg PATIENT SUPPLIED  30 mg Oral Daily Stacey Dyke, MD   30 mg at 11/30/23 2129    prochlorperazine  (COMPAZINE ) tablet 10 mg  10 mg Oral Q6H PRN Avery, Natalia, DO        Or    prochlorperazine  (COMPAZINE ) injection 10 mg  10 mg Intravenous Q6H PRN Avery, Natalia, DO   10 mg at 11/27/23 1847    valACYclovir  (VALTREX ) tablet 500 mg  500 mg Oral Daily Teressa Ferdinand, MD   500 mg at 11/30/23 2021       Physical Exam:  General: Resting, in no apparent distress, sitting in bed. Tearful about insurance  HEENT:  PERRL. EOMI. Normocephalic, atraumatic  Heart:  RRR, no m/r/g appreciated  Lungs:  Normal WOB on room air. CTAB.  Abdomen:  No distention or pain on palpation.    Skin:  No lesions on clothed exam.   Psychiatric:  Range of affect is appropriate.    Neurologic:  Alert and oriented to person, place, time and situation.  CNII-CNXII grossly intact.  Extremities:  Appear well-perfused. No clubbing, edema, or cyanosis.      Labs:  Recent Labs     12/02/23  0617 12/03/23  0357 12/03/23  1511 12/04/23  0611   WBC 10.5 11.9*  --  12.1*   NEUTROABS 8.8* 10.0*  --  9.9*   LYMPHSABS 0.9* 1.1  --  1.2   HGB 13.2 13.9  --  13.5   HCT 38.8 44.0  --  39.5   PLT 203 211  --  179   CREATININE 0.57 0.54*  --  0.44*   BUN 26* 29*  --  30*   BILITOT 0.7 1.1  --  0.7   AST  38*  --  23 21   ALT 192* 179*  --  114*   ALKPHOS 141* 140*  --  122*   K 4.1  --  4.5 4.1   MG 2.1 1.9  --  1.8   CALCIUM  10.6* 10.4  --  9.1   NA 143 144  --  142   CL 104 105  --  107   CO2 28.0 26.0  --  24.0   PHOS 3.6 3.4  --  3.6   INR 1.04 0.97  --  1.04       Imaging:  No new.      Dareen Ebbing, [employer] [place] 

## 2023-12-04 NOTE — Unmapped (Signed)
 Lumbar Puncture Procedure with ultrasound guidance (CPT [phone]) with Intrathecal Chemo Administration (CPT [phone])    Pre-procedural Planning     Patient Name:: Patricia [name]  Patient MRN: [id number]    Pre-operative Diagnosis:  Administration of intrathecal chemo    Post-operative Diagnosis: Administration of intrathecal chemo    Indications:  Administration of intrathecal chemo    Contraindications to performing lumbar puncture: Patient had no contraindications. Specifically, no local skin infections over the proposed puncture site, known elevated ICP other than in idiopathic intracranial hypertension or cryptococcal meningitis, and/or an uncontrolled bleeding diathesis.     Imaging prior to lumbar puncture: Generally, imaging should be obtained prior to performing a lumbar puncture if the patient is > 75 years old, immunocompromised, has had a seizure within one week of presentation, has an abnormal level of consciousness, or an abnormal neurologic exam (including papilledema). CNS imaging has been obtained and does not indicate increased ICP.    Known Bleeding Diathesis: Patient/caregiver denies any known bleeding or platelet disorder.     Antiplatelet Agents: This patient is not on an antiplatelet agent.    Systemic Anticoagulation: This patient is not on full systemic anticoagulation.    Significant Labs:  INR   Date Value Ref Range Status   12/04/2023 1.04  Final     PT   Date Value Ref Range Status   12/04/2023 11.9 9.9 - 12.6 sec Final     APTT   Date Value Ref Range Status   11/24/2023 117.8 (H) 24.8 - 38.4 sec Final     Platelet   Date Value Ref Range Status   12/04/2023 179 150 - 450 10*9/L Final       Consent: Informed consent for the procedure was obtained from Patient after explanation of the risks, including bleeding into pain, post-lumbar puncture headache, backache, bleeding that could result in paralysis, herniation and infection, and benefits, including symptomatic improvement and diagnostic information. Potential alternatives were discussed and all questions answered.  Refer to the consent documentation.     Procedure Details     Time-out performed immediately prior to the procedure. If IT chemo given, 2 providers checked chemo orders prior to beginning procedure.    Patient was placed in the right lateral decubitus position with hips and neck in flexion.    The superior aspect of the iliac crests were identified, with the traverse demarcating the L4-L5 interspace.  A bedside ultrasound was used to help identify the spinous processes at L2 and L3 and L4 and the intervertebral spaces were located and marked.          This area was prepped with chlorhexidine and draped in the usual sterile fashion. Sterile technique was used including antiseptics, cap, gloves, hand hygiene, mask, and sterile sheet.  Local anesthesia with 1% lidocaine  was applied subcutaneously then deep to the skin. The 20 gauge cutting (eg. Quincke) spinal needle with trocar was introduced at the L3 - L4 interspace.  CSF was obtained at 7 cm after 3 pass(es), 2 redirections.  When CSF fluid was noted an opening pressure was not obtained.  Samples were collected and sent to the lab after proper labeling.     Intrathecal MTX was administered slowly over 5 min.    The spinal needle with trocar was removed with minimal bleeding noted upon removal. A sterile bandage was placed over the puncture site after holding pressure.    Findings     6 mL of clear spinal fluid was obtained.  The CSF was sent to lab for routine studies including hemepath, protein, glucose.          Condition     The patient tolerated the procedure well and remains in the same condition as pre-procedure.    Complications and Recommendations     None; patient tolerated the procedure well.    Requesting Service: Oncology/Hematology (MDE)    Time Requested: planned  Time Completed: 1445  Comments:     Mardee Shackle, MD was present and supervised this procedure.

## 2023-12-04 NOTE — Unmapped (Signed)
 [facility] Refill Coordination Note    Specialty Medication(s) to be Shipped:   Hematology/Oncology: Iclusig     Other medication(s) to be shipped: No additional medications requested for fill at this time     Patricia [name], DOB: 1976-04-15  Phone: [phone] (home)       All above HIPAA information was verified with patient.     Was a Nurse, learning disability used for this call? No    Completed refill call assessment today to schedule patient's medication shipment from the [facility]  [phone]).  All relevant notes have been reviewed.     Specialty medication(s) and dose(s) confirmed: Regimen is correct and unchanged.   Changes to medications: Cheyane reports no changes at this time.  Changes to insurance: No  New side effects reported not previously addressed with a pharmacist or physician: None reported  Questions for the pharmacist: No    Confirmed patient received a Conservation officer, historic buildings and a Surveyor, mining with first shipment. The patient will receive a drug information handout for each medication shipped and additional FDA Medication Guides as required.       DISEASE/MEDICATION-SPECIFIC INFORMATION        N/A    SPECIALTY MEDICATION ADHERENCE     Medication Adherence    Patient reported X missed doses in the last month: 5  Specialty Medication: Iclusig  30mg   Patient is on additional specialty medications: No  Informant: patient     Were doses missed due to medication being on hold? No    Iclusig  30 mg: 7 days of medicine on hand       REFERRAL TO PHARMACIST     Referral to the pharmacist: Not needed      [facility]     Shipping address confirmed in Epic.     Cost and Payment: Patient has a $0 copay, payment information is not required.    Delivery Scheduled: Yes, Expected medication delivery date: 12/07/23.     Medication will be delivered via Next Day Courier to the prescription address in Epic [place].    Hatcher Froning M Santer Torres   [place] [facility]  Specialty Technician

## 2023-12-04 NOTE — Unmapped (Signed)
 Patient A&O X4, VSS, afebrile throughout shift. PRNs for shift include versed  for IT chemo. No acute events throughout shift. Plan of care reviewed with patient and applicable visitors, safety measures in place throughout shift.       Problem: Adult Inpatient Plan of Care  Goal: Plan of Care Review  Outcome: Ongoing - Unchanged  Goal: Patient-Specific Goal (Individualized)  Outcome: Ongoing - Unchanged  Goal: Absence of Hospital-Acquired Illness or Injury  Outcome: Ongoing - Unchanged  Intervention: Identify and Manage Fall Risk  Recent Flowsheet Documentation  Taken 12/04/2023 0715 by Mandalyn Pasqua, RN  Safety Interventions:   low bed   neutropenic precautions   nonskid shoes/slippers when out of bed   fall reduction program maintained  Intervention: Prevent Skin Injury  Recent Flowsheet Documentation  Taken 12/04/2023 0715 by Lyndsay Talamante, RN  Device Skin Pressure Protection:   adhesive use limited   pressure points protected  Skin Protection:   adhesive use limited   tubing/devices free from skin contact  Intervention: Prevent Infection  Recent Flowsheet Documentation  Taken 12/04/2023 0715 by Taylia Berber, RN  Infection Prevention:   cohorting utilized   environmental surveillance performed   equipment surfaces disinfected   hand hygiene promoted   personal protective equipment utilized   rest/sleep promoted   single patient room provided   visitors restricted/screened  Goal: Optimal Comfort and Wellbeing  Outcome: Ongoing - Unchanged  Goal: Readiness for Transition of Care  Outcome: Ongoing - Unchanged  Goal: Rounds/Family Conference  Outcome: Ongoing - Unchanged     Problem: Fall Injury Risk  Goal: Absence of Fall and Fall-Related Injury  Outcome: Ongoing - Unchanged  Intervention: Promote Injury-Free Environment  Recent Flowsheet Documentation  Taken 12/04/2023 0715 by Olubunmi Rothenberger, RN  Safety Interventions:   low bed   neutropenic precautions   nonskid shoes/slippers when out of bed   fall reduction program maintained     Problem: Comorbidity Management  Goal: Maintenance of Asthma Control  Outcome: Ongoing - Unchanged  Goal: Maintenance of Behavioral Health Symptom Control  Outcome: Ongoing - Unchanged  Goal: Maintenance of COPD Symptom Control  Outcome: Ongoing - Unchanged  Goal: Blood Glucose Levels Within Targeted Range  Outcome: Ongoing - Unchanged  Goal: Maintenance of Heart Failure Symptom Control  Outcome: Ongoing - Unchanged  Goal: Blood Pressure in Desired Range  Outcome: Ongoing - Unchanged  Goal: Maintenance of Osteoarthritis Symptom Control  Outcome: Ongoing - Unchanged  Goal: Bariatric Home Regimen Maintained  Outcome: Ongoing - Unchanged  Goal: Maintenance of Seizure Control  Outcome: Ongoing - Unchanged     Problem: Pain Acute  Goal: Optimal Pain Control and Function  Outcome: Ongoing - Unchanged

## 2023-12-05 DIAGNOSIS — C9100 Acute lymphoblastic leukemia not having achieved remission: Principal | ICD-10-CM

## 2023-12-05 LAB — COMPREHENSIVE METABOLIC PANEL
ALBUMIN: 3.6 g/dL (ref 3.4–5.0)
ALKALINE PHOSPHATASE: 110 U/L (ref 46–116)
ALT (SGPT): 103 U/L — ABNORMAL HIGH (ref 10–49)
ANION GAP: 5 mmol/L (ref 5–14)
AST (SGOT): 29 U/L (ref <=34)
BILIRUBIN TOTAL: 0.8 mg/dL (ref 0.3–1.2)
BLOOD UREA NITROGEN: 33 mg/dL — ABNORMAL HIGH (ref 9–23)
BUN / CREAT RATIO: 63
CALCIUM: 9.7 mg/dL (ref 8.7–10.4)
CHLORIDE: 105 mmol/L (ref 98–107)
CO2: 29 mmol/L (ref 20.0–31.0)
CREATININE: 0.52 mg/dL — ABNORMAL LOW (ref 0.55–1.02)
EGFR CKD-EPI (2021) FEMALE: >90 mL/min/1.73m2 (ref >=60)
GLUCOSE RANDOM: 197 mg/dL — ABNORMAL HIGH (ref 70–179)
POTASSIUM: 4.3 mmol/L (ref 3.4–4.8)
PROTEIN TOTAL: 6.2 g/dL (ref 5.7–8.2)
SODIUM: 139 mmol/L (ref 135–145)

## 2023-12-05 LAB — CBC W/ AUTO DIFF
BASOPHILS ABSOLUTE COUNT: 0 10*9/L (ref 0.0–0.1)
BASOPHILS RELATIVE PERCENT: 0.3 %
EOSINOPHILS ABSOLUTE COUNT: 0.1 10*9/L (ref 0.0–0.5)
EOSINOPHILS RELATIVE PERCENT: 1 %
HEMATOCRIT: 35.1 % (ref 34.0–44.0)
HEMOGLOBIN: 12 g/dL (ref 11.3–14.9)
LYMPHOCYTES ABSOLUTE COUNT: 1.9 10*9/L (ref 1.1–3.6)
LYMPHOCYTES RELATIVE PERCENT: 15.6 %
MEAN CORPUSCULAR HEMOGLOBIN CONC: 34.1 g/dL (ref 32.0–36.0)
MEAN CORPUSCULAR HEMOGLOBIN: 32.8 pg — ABNORMAL HIGH (ref 25.9–32.4)
MEAN CORPUSCULAR VOLUME: 96.3 fL — ABNORMAL HIGH (ref 77.6–95.7)
MEAN PLATELET VOLUME: 9.1 fL (ref 6.8–10.7)
MONOCYTES ABSOLUTE COUNT: 0.9 10*9/L — ABNORMAL HIGH (ref 0.3–0.8)
MONOCYTES RELATIVE PERCENT: 7.3 %
NEUTROPHILS ABSOLUTE COUNT: 9.1 10*9/L — ABNORMAL HIGH (ref 1.8–7.8)
NEUTROPHILS RELATIVE PERCENT: 75.8 %
PLATELET COUNT: 153 10*9/L (ref 150–450)
RED BLOOD CELL COUNT: 3.65 10*12/L — ABNORMAL LOW (ref 3.95–5.13)
RED CELL DISTRIBUTION WIDTH: 17 % — ABNORMAL HIGH (ref 12.2–15.2)
WBC ADJUSTED: 12 10*9/L — ABNORMAL HIGH (ref 3.6–11.2)

## 2023-12-05 LAB — MAGNESIUM: MAGNESIUM: 1.9 mg/dL (ref 1.6–2.6)

## 2023-12-05 LAB — PROTIME-INR
INR: 0.93
PROTIME: 10.6 s (ref 9.9–12.6)

## 2023-12-05 LAB — PHOSPHORUS: PHOSPHORUS: 3.5 mg/dL (ref 2.4–5.1)

## 2023-12-05 LAB — IGG: GAMMAGLOBULIN; IGG: 542 mg/dL — ABNORMAL LOW (ref 650–1600)

## 2023-12-05 MED ADMIN — insulin lispro (HumaLOG) injection 0-6 Units: 0-6 [IU] | SUBCUTANEOUS | @ 20:00:00

## 2023-12-05 MED ADMIN — insulin lispro (HumaLOG) injection 0-6 Units: 0-6 [IU] | SUBCUTANEOUS | @ 17:00:00

## 2023-12-05 MED ADMIN — cetirizine (ZYRTEC) tablet 10 mg: 10 mg | ORAL | @ 13:00:00

## 2023-12-05 MED ADMIN — insulin NPH (HumuLIN,NovoLIN) injection 17 Units: 17 [IU] | SUBCUTANEOUS | @ 13:00:00 | Stop: 2023-12-05

## 2023-12-05 MED ADMIN — aspirin chewable tablet 81 mg: 81 mg | ORAL | @ 13:00:00

## 2023-12-05 MED ADMIN — insulin NPH (HumuLIN,NovoLIN) injection 17 Units: 17 [IU] | SUBCUTANEOUS | @ 04:00:00

## 2023-12-05 MED ADMIN — cyclobenzaprine (FLEXERIL) tablet 10 mg: 10 mg | ORAL | @ 01:00:00

## 2023-12-05 MED ADMIN — dapsone tablet 100 mg: 100 mg | ORAL | @ 13:00:00 | Stop: 2023-12-25

## 2023-12-05 MED ADMIN — valACYclovir (VALTREX) tablet 500 mg: 500 mg | ORAL | @ 01:00:00 | Stop: 2023-12-24

## 2023-12-05 MED ADMIN — PONATinib (ICLUSIG) tablet 30 mg ***PATIENT SUPPLIED***: 30 mg | ORAL | @ 01:00:00

## 2023-12-05 MED ADMIN — escitalopram oxalate (LEXAPRO) tablet 10 mg: 10 mg | ORAL | @ 13:00:00 | Stop: 2023-12-25

## 2023-12-05 MED ADMIN — losartan (COZAAR) tablet 50 mg: 50 mg | ORAL | @ 13:00:00

## 2023-12-05 MED ADMIN — letermovir (PREVYMIS) tablet 480 mg: 480 mg | ORAL | @ 01:00:00 | Stop: 2023-12-25

## 2023-12-05 MED ADMIN — insulin lispro (HumaLOG) injection 0-6 Units: 0-6 [IU] | SUBCUTANEOUS | @ 04:00:00

## 2023-12-05 MED ADMIN — enoxaparin (LOVENOX) syringe 40 mg: 40 mg | SUBCUTANEOUS | @ 13:00:00 | Stop: 2023-12-07

## 2023-12-05 MED ADMIN — psyllium (METAMUCIL) packet 1 packet: 1 | ORAL | @ 13:00:00

## 2023-12-05 NOTE — Unmapped (Signed)
 Cardiology Treatment Plan Note    In brief, this is a 47 y.o. 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  On review of telemetry, do not see any evidence of atrial fibrillation.  Most episodes of tachycardia correspond to sinus tachycardia.    Currently she has CNS leukemia requiring frequent lumbar punctures.  Given the nature of the procedure, this tends to be significantly high risk in the setting of ongoing anticoagulation use. Thus, even if she were to have atrial fibrillation (which we do not see evidence of), anticoagulation would be relatively contraindicated at this time for that indication.    This patient was discussed with consult attending, Dr. Lilton Relic.    Angelo Kennedy, MD  Cardiovascular Medicine Fellow

## 2023-12-05 NOTE — Unmapped (Signed)
 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 Started on IT methotrexate  and dexamethasone .     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 Started on biweekly IT methotrexate  and dexamethasone . Bone marrow biopsy 5/2 results pending.   Mon/Thurs schedule for LP with IT MTX. Last treatment 5/5.   Next treatment will be 5/8  Lovenox  restarted. Will hold on day of LP (5/8)  Headache: dexamethasone  STOPPED 5/5  PRN metoclopramide  & PRN diphenhydramine   Ponatanib restarted 5/1 due to disease progression  Monitor LFTs daily, improving  Continue ASA 81mg  daily for arterial clot ppx  Continue enoxaparin  40mg  subQ for DVT ppx (hold day of LP)  Prophylaxis:valtrex , Prevymis     Intermittent Tachycardia - c/f Intermittent afib with RVR  Starting 5/4 patient having intermittent episodes of tachycardia (130-160 bpm). The episodes were found during vital checks and on telemetry. EKG x2 showed normal sinus rhythm with normal rate. Electrolytes are stable. Patient asymptomatic during episodes. She states she has not been drinking much water, but is eating adequately. Telemetry showed a few episodes concerning for Afib with RVR. Asymptomatic.   - Telemetry 24 hrs: showed concern for Afib with RVR  - K+ >4 Mag >2  - Pending Echocardiogram  - Cardiology consulted, appreciate recs    Hypertension  History of chronic hypertension. Uptitrated to home dosage of Losartan  50 mg 5/4.   Increased PO Losartan  50 mg (home dose)  Continue Labetalol  PRN SBP>180 or DBP>120     Acute Liver Injury - C/f Tylenol  Toxicity, improving   On admission found to have acutely elevated  ALT and AST. Liver doppler 4/26 without signs of veno-occlusive leukemic infiltration or portal vein thrombosis. Acute hepatitis panel negative. HIV negative. Trending labs with daily improvement.   Trend hepatic function panel   Trend PT-INR, CBC/Diff, CMP/Mg/Phos  Monitor LFTs after ponatanib resumption     T2DM  Suspected elevation in BG during course of steroids. Will CTM with steriod taper.   NPH 13u BID   Increased SSI to SF 30    Neck pain  CT of Cervical spine at OSH hospital showed degenerative disc disease at C5-C6 with no spinal cord compression. Patient states she has increased pain in her neck from lying in bed all day. It improves with movement and muscle relaxers. No numbness or tingling. Improves with voltaren  gel and cold packs. Will continue to monitor symptoms   - Voltaren  gel prn   - Ice Packs   - PRN Flexeril  - decreased to 5mg  %ID due to concern for qtc prolongation      Chronic Problems  HLD: HOLD rosuvastatin  10 mg nightly     Impending Electrolyte Abnormality Secondary to Chemotherapy and/or IV Fluids  -Daily Electrolyte  monitoring  -Replete per [facility] guidelines.     Immunocompromised status: Patient is immunocompromised secondary to disease and chemotherapy  -Antimicrobial prophylaxis as above    Impending Pancytopenia secondary to Acute Leukemia and chemotherapy:   - Transfuse hgb <7  - Transfuse plt <10K    Nutrition: Diabetic                        Subjective:     Interval History:  [name]. Patient is feeling well at this time. She states the LP went well yesterday. She denies shortness of breath, lightheadness or feeling like her heart is fluttering     10 point ROS otherwise negative except as above in the HPI.     Objective:     Vital signs in last 24 hours:  Temp:  [36.5 ??C (97.7 ??F)-36.7 ??C (98.1 ??F)] 36.6 ??C (97.9 ??F)  Pulse:  [72-151] 88  Resp:  [16-18] 16  BP: (107-133)/(73-97) 127/93  MAP (mmHg):  [85-109] 105  SpO2:  [95 %-98 %] 96 %    Intake/Output last 3 shifts:  I/O last 3 completed shifts:  In: 1000 [P.O.:1000]  Out: -     Meds:  Current Facility-Administered Medications   Medication Dose Route Frequency Provider Last Rate Last Admin    albuterol  (PROVENTIL  HFA;VENTOLIN  HFA) 90 mcg/actuation inhaler 2 puff  2 puff Inhalation Q6H PRN Daphene Dys, MD        aspirin  chewable tablet 81 mg  81 mg Oral Daily Kitay, Ilona, MD   81 mg at 12/01/23 1048    bismuth  subsalicylate (PEPTO-BISMOL) oral suspension  30 mL Oral Q6H PRN Rudloff, Michael W, MD   30 mL at 11/25/23 1648    cetirizine  (ZYRTEC ) tablet 10 mg  10 mg Oral Daily Rudloff, Michael W, MD   10 mg at 12/01/23 2956    CHEMO CLARIFICATION ORDER   Other Continuous PRN Stacey Dyke, MD        CHEMO CLARIFICATION ORDER   Other Continuous PRN Stacey Dyke, MD        cyclobenzaprine  (FLEXERIL ) tablet 10 mg  10 mg Oral TID PRN Lindsay Rho, Amyra Vantuyl C, DO   10 mg at 11/30/23 2019    dapsone  tablet 100 mg  100 mg Oral Daily Daphene Dys, MD   100 mg at 12/01/23 2130    dexAMETHasone  (DECADRON ) tablet 4 mg  4 mg Oral Q12H [facility] Kitay, Ilona, MD        dextrose  50 % in water (D50W) 50 % solution 12.5 g  12.5 g Intravenous Q15 Min PRN Cookmeyer, Helga Loan, MD        diclofenac  sodium (VOLTAREN ) 1 % gel 4 g  4 g Topical QID Loletha Ripper, MD   4 g at 11/29/23 1133    diphenhydrAMINE  (BENADRYL ) injection  25 mg Intravenous Q6H PRN Cornett, Fletcher Humble, MD   25 mg at 11/30/23 0858    enoxaparin  (LOVENOX ) syringe 40 mg  40 mg Subcutaneous Q24H [facility] Kitay, Ilona, MD   40 mg at 12/01/23 1048    escitalopram  oxalate (LEXAPRO ) tablet 10 mg  10 mg Oral Daily Daphene Dys, MD   10 mg at 12/01/23 8657    glucagon injection 1 mg  1 mg Intramuscular Once PRN Cookmeyer, Helga Loan, MD        glucose chewable tablet 16 g  16 g Oral Q10 Min PRN Cookmeyer, Helga Loan, MD  insulin  lispro (HumaLOG ) injection 0-6 Units  0-6 Units Subcutaneous ACHS Loletha Ripper, MD   1 Units at 12/01/23 9629    insulin  lispro (HumaLOG ) injection 7 Units  7 Units Subcutaneous 3xd Meals Lichtman, Ezra S, MD   7 Units at 12/01/23 0830    insulin  NPH (HumuLIN ,NovoLIN ) injection 17 Units  17 Units Subcutaneous Q12H [facility] Lichtman, Ezra S, MD   17 Units at 12/01/23 0831    IP OKAY TO TREAT   Other Continuous PRN Stacey Dyke, MD        IP OKAY TO TREAT   Other Continuous PRN Stacey Dyke, MD        labetalol  (NORMODYNE ) injection  10 mg Intravenous Q6H PRN Teressa Ferdinand, MD   10 mg at 11/29/23 1640    letermovir  (PREVYMIS ) tablet 480 mg  480 mg Oral Daily Daphene Dys, MD   480 mg at 11/30/23 2021    lidocaine  (ASPERCREME) 4 % 1 patch  1 patch Transdermal Daily Zewdu, Winthrop Hawks, MD        losartan  (COZAAR ) tablet 25 mg  25 mg Oral Daily Rudloff, Michael W, MD   25 mg at 12/01/23 5284    metoclopramide  (REGLAN ) injection 10 mg  10 mg Intravenous Q6H PRN Cornett, Fletcher Humble, MD   10 mg at 11/30/23 1324    midazolam  (VERSED ) injection 2 mg  2 mg Intravenous Once PRN Beverlyn Buckles, MD        oxyCODONE  (ROXICODONE ) immediate release tablet 5 mg  5 mg Oral Q4H PRN Cookmeyer, Helga Loan, MD        senna (SENOKOT) tablet 2 tablet  2 tablet Oral Nightly PRN Cornett, Fletcher Humble, MD        Or    polyethylene glycol (MIRALAX ) packet 17 g  17 g Oral Daily PRN Cornett, Fletcher Humble, MD        PONATinib  (ICLUSIG ) tablet 30 mg PATIENT SUPPLIED  30 mg Oral Daily Stacey Dyke, MD   30 mg at 11/30/23 2129    prochlorperazine  (COMPAZINE ) tablet 10 mg  10 mg Oral Q6H PRN Avery, Natalia, DO        Or    prochlorperazine  (COMPAZINE ) injection 10 mg  10 mg Intravenous Q6H PRN Avery, Natalia, DO   10 mg at 11/27/23 1847    valACYclovir  (VALTREX ) tablet 500 mg  500 mg Oral Daily Teressa Ferdinand, MD   500 mg at 11/30/23 2021       Physical Exam:  General: Resting, in no apparent distress, sitting in bed.  HEENT:  PERRL. EOMI. Normocephalic, atraumatic  Heart:  RRR, no m/r/g appreciated  Lungs:  Normal WOB on room air. CTAB.  Abdomen:  No distention or pain on palpation.    Skin:  No lesions on clothed exam.   Psychiatric:  Range of affect is appropriate.    Neurologic:  Alert and oriented to person, place, time and situation.  CNII-CNXII grossly intact.  Extremities:  Appear well-perfused. No clubbing, edema, or cyanosis.      Labs:  Recent Labs     12/03/23  0357 12/03/23  1511 12/04/23  0611 12/05/23  0442   WBC 11.9*  --  12.1* 12.0*   NEUTROABS 10.0*  --  9.9* 9.1*   LYMPHSABS 1.1  --  1.2 1.9   HGB 13.9  --  13.5 12.0   HCT 44.0  --  39.5 35.1   PLT 211  --  179  153   CREATININE 0.54*  --  0.44* 0.52*   BUN 29*  --  30* 33*   BILITOT 1.1  --  0.7 0.8   AST  --  23 21 29    ALT 179*  --  114* 103*   ALKPHOS 140*  --  122* 110   K  --  4.5 4.1 4.3   MG 1.9  --  1.8 1.9   CALCIUM  10.4  --  9.1 9.7   NA 144  --  142 139   CL 105  --  107 105   CO2 26.0  --  24.0 29.0   PHOS 3.4  --  3.6 3.5   INR 0.97  --  1.04 0.93   IGG  --   --   --  542*       Imaging:  No new.      Dareen Ebbing, [employer] [place] 

## 2023-12-05 NOTE — Unmapped (Signed)
 Afebrile. Tachy up to 147 with ambulation and able to recover on own. Otherwise VSS. PRNs included flexeril  x1. Still hyperglycemic overnight at 302. Safety maintained.       Problem: Adult Inpatient Plan of Care  Goal: Plan of Care Review  Outcome: Ongoing - Unchanged  Goal: Patient-Specific Goal (Individualized)  Outcome: Ongoing - Unchanged  Goal: Absence of Hospital-Acquired Illness or Injury  Outcome: Ongoing - Unchanged  Intervention: Identify and Manage Fall Risk  Recent Flowsheet Documentation  Taken 12/04/2023 1920 by Kary Pages, RN  Safety Interventions:   bleeding precautions   chemotherapeutic agent precautions   environmental modification   fall reduction program maintained   family at bedside   infection management   isolation precautions   lighting adjusted for tasks/safety   low bed   neutropenic precautions   nonskid shoes/slippers when out of bed  Intervention: Prevent Skin Injury  Recent Flowsheet Documentation  Taken 12/04/2023 1920 by Kary Pages, RN  Positioning for Skin: Supine/Back  Device Skin Pressure Protection: adhesive use limited  Skin Protection: adhesive use limited  Intervention: Prevent Infection  Recent Flowsheet Documentation  Taken 12/04/2023 1920 by Kary Pages, RN  Infection Prevention:   cohorting utilized   environmental surveillance performed   equipment surfaces disinfected   hand hygiene promoted   personal protective equipment utilized   rest/sleep promoted   single patient room provided   visitors restricted/screened  Goal: Optimal Comfort and Wellbeing  Outcome: Ongoing - Unchanged  Goal: Readiness for Transition of Care  Outcome: Ongoing - Unchanged  Goal: Rounds/Family Conference  Outcome: Ongoing - Unchanged     Problem: Fall Injury Risk  Goal: Absence of Fall and Fall-Related Injury  Outcome: Ongoing - Unchanged  Intervention: Promote Injury-Free Environment  Recent Flowsheet Documentation  Taken 12/04/2023 1920 by Kary Pages, RN  Safety Interventions: bleeding precautions   chemotherapeutic agent precautions   environmental modification   fall reduction program maintained   family at bedside   infection management   isolation precautions   lighting adjusted for tasks/safety   low bed   neutropenic precautions   nonskid shoes/slippers when out of bed     Problem: Comorbidity Management  Goal: Maintenance of Asthma Control  Outcome: Ongoing - Unchanged  Goal: Maintenance of Behavioral Health Symptom Control  Outcome: Ongoing - Unchanged  Goal: Maintenance of COPD Symptom Control  Outcome: Ongoing - Unchanged  Goal: Blood Glucose Levels Within Targeted Range  Outcome: Ongoing - Unchanged  Goal: Maintenance of Heart Failure Symptom Control  Outcome: Ongoing - Unchanged  Goal: Blood Pressure in Desired Range  Outcome: Ongoing - Unchanged  Goal: Maintenance of Osteoarthritis Symptom Control  Outcome: Ongoing - Unchanged  Intervention: Maintain Osteoarthritis Symptom Control  Recent Flowsheet Documentation  Taken 12/04/2023 1920 by Kary Pages, RN  Activity Management: up ad lib  Goal: Bariatric Home Regimen Maintained  Outcome: Ongoing - Unchanged  Goal: Maintenance of Seizure Control  Outcome: Ongoing - Unchanged     Problem: Pain Acute  Goal: Optimal Pain Control and Function  Outcome: Ongoing - Unchanged

## 2023-12-06 DIAGNOSIS — C9100 Acute lymphoblastic leukemia not having achieved remission: Principal | ICD-10-CM

## 2023-12-06 LAB — CBC W/ AUTO DIFF
BASOPHILS ABSOLUTE COUNT: 0 10*9/L (ref 0.0–0.1)
BASOPHILS RELATIVE PERCENT: 0.3 %
EOSINOPHILS ABSOLUTE COUNT: 0.1 10*9/L (ref 0.0–0.5)
EOSINOPHILS RELATIVE PERCENT: 1.2 %
HEMATOCRIT: 33.1 % — ABNORMAL LOW (ref 34.0–44.0)
HEMOGLOBIN: 11.8 g/dL (ref 11.3–14.9)
LYMPHOCYTES ABSOLUTE COUNT: 1.7 10*9/L (ref 1.1–3.6)
LYMPHOCYTES RELATIVE PERCENT: 20.6 %
MEAN CORPUSCULAR HEMOGLOBIN CONC: 35.6 g/dL (ref 32.0–36.0)
MEAN CORPUSCULAR HEMOGLOBIN: 34.6 pg — ABNORMAL HIGH (ref 25.9–32.4)
MEAN CORPUSCULAR VOLUME: 97.3 fL — ABNORMAL HIGH (ref 77.6–95.7)
MEAN PLATELET VOLUME: 9 fL (ref 6.8–10.7)
MONOCYTES ABSOLUTE COUNT: 0.4 10*9/L (ref 0.3–0.8)
MONOCYTES RELATIVE PERCENT: 5.2 %
NEUTROPHILS ABSOLUTE COUNT: 6 10*9/L (ref 1.8–7.8)
NEUTROPHILS RELATIVE PERCENT: 72.7 %
PLATELET COUNT: 146 10*9/L — ABNORMAL LOW (ref 150–450)
RED BLOOD CELL COUNT: 3.4 10*12/L — ABNORMAL LOW (ref 3.95–5.13)
RED CELL DISTRIBUTION WIDTH: 17.1 % — ABNORMAL HIGH (ref 12.2–15.2)
WBC ADJUSTED: 8.3 10*9/L (ref 3.6–11.2)

## 2023-12-06 LAB — COMPREHENSIVE METABOLIC PANEL
ALBUMIN: 3.5 g/dL (ref 3.4–5.0)
ALKALINE PHOSPHATASE: 102 U/L (ref 46–116)
ALT (SGPT): 91 U/L — ABNORMAL HIGH (ref 10–49)
ANION GAP: 17 mmol/L — ABNORMAL HIGH (ref 5–14)
AST (SGOT): 28 U/L (ref <=34)
BILIRUBIN TOTAL: 0.7 mg/dL (ref 0.3–1.2)
BLOOD UREA NITROGEN: 29 mg/dL — ABNORMAL HIGH (ref 9–23)
BUN / CREAT RATIO: 47
CALCIUM: 9.6 mg/dL (ref 8.7–10.4)
CHLORIDE: 104 mmol/L (ref 98–107)
CO2: 17 mmol/L — ABNORMAL LOW (ref 20.0–31.0)
CREATININE: 0.62 mg/dL (ref 0.55–1.02)
EGFR CKD-EPI (2021) FEMALE: >90 mL/min/1.73m2 (ref >=60)
GLUCOSE RANDOM: 245 mg/dL — ABNORMAL HIGH (ref 70–179)
POTASSIUM: 4.2 mmol/L (ref 3.4–4.8)
PROTEIN TOTAL: 5.8 g/dL (ref 5.7–8.2)
SODIUM: 138 mmol/L (ref 135–145)

## 2023-12-06 LAB — PROTIME-INR
INR: 0.97
PROTIME: 11.1 s (ref 9.9–12.6)

## 2023-12-06 LAB — PHOSPHORUS: PHOSPHORUS: 4.1 mg/dL (ref 2.4–5.1)

## 2023-12-06 LAB — MAGNESIUM: MAGNESIUM: 1.9 mg/dL (ref 1.6–2.6)

## 2023-12-06 MED ADMIN — aspirin chewable tablet 81 mg: 81 mg | ORAL | @ 13:00:00

## 2023-12-06 MED ADMIN — insulin NPH (HumuLIN,NovoLIN) injection 13 Units: 13 [IU] | SUBCUTANEOUS | @ 13:00:00

## 2023-12-06 MED ADMIN — escitalopram oxalate (LEXAPRO) tablet 10 mg: 10 mg | ORAL | @ 13:00:00 | Stop: 2023-12-25

## 2023-12-06 MED ADMIN — insulin NPH (HumuLIN,NovoLIN) injection 13 Units: 13 [IU] | SUBCUTANEOUS

## 2023-12-06 MED ADMIN — dapsone tablet 100 mg: 100 mg | ORAL | @ 13:00:00 | Stop: 2023-12-25

## 2023-12-06 MED ADMIN — enoxaparin (LOVENOX) syringe 40 mg: 40 mg | SUBCUTANEOUS | @ 12:00:00 | Stop: 2023-12-06

## 2023-12-06 MED ADMIN — polyethylene glycol (MIRALAX) packet 17 g: 17 g | ORAL | @ 13:00:00

## 2023-12-06 MED ADMIN — insulin lispro (HumaLOG) injection 0-6 Units: 0-6 [IU] | SUBCUTANEOUS

## 2023-12-06 MED ADMIN — PONATinib (ICLUSIG) tablet 30 mg ***PATIENT SUPPLIED***: 30 mg | ORAL

## 2023-12-06 MED ADMIN — insulin lispro (HumaLOG) injection 0-6 Units: 0-6 [IU] | SUBCUTANEOUS | @ 22:00:00

## 2023-12-06 MED ADMIN — insulin lispro (HumaLOG) injection 0-6 Units: 0-6 [IU] | SUBCUTANEOUS | @ 17:00:00

## 2023-12-06 MED ADMIN — losartan (COZAAR) tablet 50 mg: 50 mg | ORAL | @ 13:00:00

## 2023-12-06 MED ADMIN — valACYclovir (VALTREX) tablet 500 mg: 500 mg | ORAL | Stop: 2023-12-24

## 2023-12-06 MED ADMIN — letermovir (PREVYMIS) tablet 480 mg: 480 mg | ORAL | Stop: 2023-12-25

## 2023-12-06 MED ADMIN — cetirizine (ZYRTEC) tablet 10 mg: 10 mg | ORAL | @ 13:00:00

## 2023-12-06 MED ADMIN — insulin lispro (HumaLOG) injection 0-6 Units: 0-6 [IU] | SUBCUTANEOUS | @ 12:00:00

## 2023-12-06 MED FILL — ICLUSIG 30 MG TABLET: ORAL | 30 days supply | Qty: 30 | Fill #1

## 2023-12-06 NOTE — Unmapped (Signed)
 Daily Progress Note      Principal Problem:    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)  Active Problems:    Blurred vision, bilateral    Hypertension    Headache    Metastasis to brain    Drug-induced injury of liver    Type 2 diabetes mellitus, with long-term current use of insulin     Hyperlipidemia       LOS: 12 days     Assessment/Plan:Patricia [name] is an 48 y.o. 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 Started on IT methotrexate  and dexamethasone .     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 Started on biweekly IT methotrexate . MDR positive  Mon/Thurs schedule for LP with IT MTX. Last treatment 5/5.   Next treatment will be 5/8  Lovenox  restarted. Will hold on day of LP (5/8)  Headache, improved: dexamethasone  STOPPED 5/5  Ponatanib restarted 5/1 due to disease progression  Monitor LFTs daily, improving  Continue ASA 81mg  daily for arterial clot ppx  Continue enoxaparin  40mg  subQ for DVT ppx (hold day of LP)  Prophylaxis:valtrex , Prevymis   MDR positive. Will plan to start blinatumomab 5/8 if CSF cleared on LP.     Intermittent Tachycardia   Starting 5/4 patient having intermittent episodes of tachycardia (130-160 bpm). The episodes were found during vital checks and on telemetry. EKG x2 showed normal sinus rhythm with normal rate. Electrolytes are stable. Patient asymptomatic during episodes. Evaluated by cardiology with low concern for afib. Advised to avoid anticoagulation iso weekly lumbar punctures. Echocardiogram stable.   - Telemetry stopped.   - K+ >4 Mag >2    Hypertension  History of chronic hypertension. Uptitrated to home dosage of Losartan  50 mg 5/4.   Continue PO Losartan  50 mg (home dose)  Continue Labetalol  PRN SBP>180 or DBP>120     Acute Liver Injury - C/f Tylenol  Toxicity, improving   On admission found to have acutely elevated  ALT and AST. Liver doppler 4/26 without signs of veno-occlusive leukemic infiltration or portal vein thrombosis. Acute hepatitis panel negative. HIV negative. Trending labs with daily improvement.   Trend hepatic function panel   Trend PT-INR, CBC/Diff, CMP/Mg/Phos  Monitor LFTs after ponatanib resumption     T2DM  Suspected elevation in BG during course of steroids. Will CTM with steriod taper.   NPH 13u BID   Increased SSI to SF 30    Neck pain  CT of Cervical spine at OSH hospital showed degenerative disc disease at C5-C6 with no spinal cord compression. Patient states she has increased pain in her neck from lying in bed all day. It improves with movement and muscle relaxers. No numbness or tingling. Improves with voltaren  gel and cold packs. Will continue to monitor symptoms   - Voltaren  gel prn   - Ice Packs   - PRN Flexeril        Chronic Problems  HLD: HOLD rosuvastatin  10 mg nightly     Impending Electrolyte Abnormality Secondary to Chemotherapy and/or IV Fluids  -Daily Electrolyte monitoring  -Replete per [facility] guidelines.     Immunocompromised status: Patient is immunocompromised secondary to disease and chemotherapy  -Antimicrobial prophylaxis as above    Impending Pancytopenia secondary to Acute Leukemia  and chemotherapy:   - Transfuse hgb <7  - Transfuse plt <10K    Nutrition: Diabetic                        Subjective:     Interval History:  [name]. Patient feeling sad about the MDR results being positive. She states she is ready to start treatment and ready to fight.     10 point ROS otherwise negative except as above in the HPI.     Objective:     Vital signs in last 24 hours:  Temp:  [36.4 ??C (97.5 ??F)-36.8 ??C (98.2 ??F)] 36.4 ??C (97.5 ??F)  Pulse:  [89-113] 113  Resp:  [16-18] 18  BP: (112-123)/(79-92) 118/92  MAP (mmHg):  [90-102] 102  SpO2:  [95 %-97 %] 97 %    Intake/Output last 3 shifts:  I/O last 3 completed shifts:  In: 700 [P.O.:700]  Out: -     Meds:  Current Facility-Administered Medications   Medication Dose Route Frequency Provider Last Rate Last Admin    albuterol  (PROVENTIL  HFA;VENTOLIN  HFA) 90 mcg/actuation inhaler 2 puff  2 puff Inhalation Q6H PRN Daphene Dys, MD        aspirin  chewable tablet 81 mg  81 mg Oral Daily Kitay, Ilona, MD   81 mg at 12/01/23 1048    bismuth  subsalicylate (PEPTO-BISMOL) oral suspension  30 mL Oral Q6H PRN Rudloff, Michael W, MD   30 mL at 11/25/23 1648    cetirizine  (ZYRTEC ) tablet 10 mg  10 mg Oral Daily Rudloff, Michael W, MD   10 mg at 12/01/23 1610    CHEMO CLARIFICATION ORDER   Other Continuous PRN Stacey Dyke, MD        CHEMO CLARIFICATION ORDER   Other Continuous PRN Stacey Dyke, MD        cyclobenzaprine  (FLEXERIL ) tablet 10 mg  10 mg Oral TID PRN Lindsay Rho, Amaal Dimartino C, DO   10 mg at 11/30/23 2019    dapsone  tablet 100 mg  100 mg Oral Daily Daphene Dys, MD   100 mg at 12/01/23 9604    dexAMETHasone  (DECADRON ) tablet 4 mg  4 mg Oral Q12H [facility] Kitay, Ilona, MD        dextrose  50 % in water (D50W) 50 % solution 12.5 g  12.5 g Intravenous Q15 Min PRN Cookmeyer, Helga Loan, MD        diclofenac  sodium (VOLTAREN ) 1 % gel 4 g  4 g Topical QID Loletha Ripper, MD   4 g at 11/29/23 1133    diphenhydrAMINE  (BENADRYL ) injection  25 mg Intravenous Q6H PRN Cornett, Fletcher Humble, MD   25 mg at 11/30/23 0858    enoxaparin  (LOVENOX ) syringe 40 mg  40 mg Subcutaneous Q24H [facility] Kitay, Ilona, MD   40 mg at 12/01/23 1048    escitalopram  oxalate (LEXAPRO ) tablet 10 mg  10 mg Oral Daily Daphene Dys, MD   10 mg at 12/01/23 5409    glucagon injection 1 mg  1 mg Intramuscular Once PRN Cookmeyer, Helga Loan, MD        glucose chewable tablet 16 g  16 g Oral Q10 Min PRN Cookmeyer, Helga Loan, MD        insulin  lispro (HumaLOG ) injection 0-6 Units  0-6 Units Subcutaneous ACHS Loletha Ripper, MD   1 Units at 12/01/23 8119    insulin  lispro (HumaLOG ) injection 7 Units  7 Units Subcutaneous 3xd Meals  Arvil Lauber, MD   7 Units at 12/01/23 0830    insulin  NPH (HumuLIN ,NovoLIN ) injection 17 Units  17 Units Subcutaneous Q12H [facility] Lichtman, Ezra S, MD   17 Units at 12/01/23 0831    IP OKAY TO TREAT   Other Continuous PRN Stacey Dyke, MD        IP OKAY TO TREAT   Other Continuous PRN Stacey Dyke, MD        labetalol  (NORMODYNE ) injection  10 mg Intravenous Q6H PRN Teressa Ferdinand, MD   10 mg at 11/29/23 1640    letermovir  (PREVYMIS ) tablet 480 mg  480 mg Oral Daily Daphene Dys, MD   480 mg at 11/30/23 2021    lidocaine  (ASPERCREME) 4 % 1 patch  1 patch Transdermal Daily Zewdu, Winthrop Hawks, MD        losartan  (COZAAR ) tablet 25 mg  25 mg Oral Daily Rudloff, Michael W, MD   25 mg at 12/01/23 0833    metoclopramide  (REGLAN ) injection 10 mg  10 mg Intravenous Q6H PRN Cornett, Fletcher Humble, MD   10 mg at 11/30/23 3329    midazolam  (VERSED ) injection 2 mg  2 mg Intravenous Once PRN Beverlyn Buckles, MD        oxyCODONE  (ROXICODONE ) immediate release tablet 5 mg  5 mg Oral Q4H PRN Cookmeyer, Helga Loan, MD        senna (SENOKOT) tablet 2 tablet  2 tablet Oral Nightly PRN Cornett, Fletcher Humble, MD        Or    polyethylene glycol (MIRALAX ) packet 17 g  17 g Oral Daily PRN Cornett, Fletcher Humble, MD        PONATinib  (ICLUSIG ) tablet 30 mg PATIENT SUPPLIED  30 mg Oral Daily Stacey Dyke, MD   30 mg at 11/30/23 2129    prochlorperazine  (COMPAZINE ) tablet 10 mg  10 mg Oral Q6H PRN Avery, Natalia, DO        Or    prochlorperazine  (COMPAZINE ) injection 10 mg  10 mg Intravenous Q6H PRN Avery, Natalia, DO   10 mg at 11/27/23 1847    valACYclovir  (VALTREX ) tablet 500 mg  500 mg Oral Daily Teressa Ferdinand, MD   500 mg at 11/30/23 2021       Physical Exam:  General: Resting, in no apparent distress, sitting in bed.  HEENT:  PERRL. EOMI. Normocephalic, atraumatic  Heart:  RRR, no m/r/g appreciated  Lungs:  Normal WOB on room air. CTAB.  Abdomen:  No distention or pain on palpation.    Skin:  No lesions on clothed exam.   Psychiatric:  Range of affect is appropriate.    Neurologic:  Alert and oriented to person, place, time and situation.  CNII-CNXII grossly intact.  Extremities:  Appear well-perfused. No clubbing, edema, or cyanosis.      Labs:  Recent Labs     12/04/23  0611 12/05/23  0442 12/06/23  0145   WBC 12.1* 12.0* 8.3   NEUTROABS 9.9* 9.1* 6.0   LYMPHSABS 1.2 1.9 1.7   HGB 13.5 12.0 11.8   HCT 39.5 35.1 33.1*   PLT 179 153 146*   CREATININE 0.44* 0.52* 0.62   BUN 30* 33* 29*   BILITOT 0.7 0.8 0.7   AST 21 29 28    ALT 114* 103* 91*   ALKPHOS 122* 110 102   K 4.1 4.3 4.2   MG 1.8 1.9 1.9   CALCIUM  9.1 9.7 9.6   NA  142 139 138   CL 107 105 104   CO2 24.0 29.0 17.0*   PHOS 3.6 3.5 4.1   INR 1.04 0.93 0.97   IGG  --  542*  --        Imaging:  No new.      Dareen Ebbing, [employer] [place] 

## 2023-12-06 NOTE — Unmapped (Signed)
 VSS. No PRNs required overnight. Better blood sugar control yesterday. C/o hemmorhoids at the beginning of shift but resolved on their own. No other voiced complaints. Safety maintained.     Problem: Adult Inpatient Plan of Care  Goal: Plan of Care Review  Outcome: Ongoing - Unchanged  Goal: Patient-Specific Goal (Individualized)  Outcome: Ongoing - Unchanged  Goal: Absence of Hospital-Acquired Illness or Injury  Outcome: Ongoing - Unchanged  Intervention: Identify and Manage Fall Risk  Recent Flowsheet Documentation  Taken 12/05/2023 1910 by Kary Pages, RN  Safety Interventions:   bleeding precautions   chemotherapeutic agent precautions   environmental modification   fall reduction program maintained   infection management   isolation precautions   lighting adjusted for tasks/safety   low bed   neutropenic precautions   nonskid shoes/slippers when out of bed  Intervention: Prevent Skin Injury  Recent Flowsheet Documentation  Taken 12/05/2023 1910 by Kary Pages, RN  Positioning for Skin: Supine/Back  Device Skin Pressure Protection: adhesive use limited  Skin Protection: adhesive use limited  Intervention: Prevent Infection  Recent Flowsheet Documentation  Taken 12/05/2023 1910 by Kary Pages, RN  Infection Prevention:   cohorting utilized   environmental surveillance performed   equipment surfaces disinfected   hand hygiene promoted   personal protective equipment utilized   rest/sleep promoted   single patient room provided   visitors restricted/screened  Goal: Optimal Comfort and Wellbeing  Outcome: Ongoing - Unchanged  Goal: Readiness for Transition of Care  Outcome: Ongoing - Unchanged  Goal: Rounds/Family Conference  Outcome: Ongoing - Unchanged     Problem: Fall Injury Risk  Goal: Absence of Fall and Fall-Related Injury  Outcome: Ongoing - Unchanged  Intervention: Promote Injury-Free Environment  Recent Flowsheet Documentation  Taken 12/05/2023 1910 by Kary Pages, RN  Safety Interventions: bleeding precautions   chemotherapeutic agent precautions   environmental modification   fall reduction program maintained   infection management   isolation precautions   lighting adjusted for tasks/safety   low bed   neutropenic precautions   nonskid shoes/slippers when out of bed     Problem: Pain Acute  Goal: Optimal Pain Control and Function  Outcome: Ongoing - Unchanged

## 2023-12-06 NOTE — Unmapped (Signed)
 Patient A&O X4, VSS, afebrile throughout shift. No prn's given this shift. No acute events throughout shift. Plan of care reviewed with patient and applicable visitors, safety measures in place throughout shift.       Problem: Adult Inpatient Plan of Care  Goal: Plan of Care Review  Outcome: Ongoing - Unchanged  Goal: Patient-Specific Goal (Individualized)  Outcome: Ongoing - Unchanged  Goal: Absence of Hospital-Acquired Illness or Injury  Outcome: Ongoing - Unchanged  Intervention: Identify and Manage Fall Risk  Recent Flowsheet Documentation  Taken 12/06/2023 0712 by Dasie Epps, RN  Safety Interventions:   environmental modification   infection management   isolation precautions   lighting adjusted for tasks/safety   low bed   muscle strengthening facilitated   nonskid shoes/slippers when out of bed  Intervention: Prevent Skin Injury  Recent Flowsheet Documentation  Taken 12/06/2023 0826 by Dasie Epps, RN  Positioning for Skin: Supine/Back  Taken 12/06/2023 0712 by Dasie Epps, RN  Positioning for Skin: Right  Intervention: Prevent Infection  Recent Flowsheet Documentation  Taken 12/06/2023 0712 by Dasie Epps, RN  Infection Prevention:   cohorting utilized   environmental surveillance performed   equipment surfaces disinfected   hand hygiene promoted   personal protective equipment utilized   rest/sleep promoted   single patient room provided   visitors restricted/screened  Goal: Optimal Comfort and Wellbeing  Outcome: Ongoing - Unchanged  Goal: Readiness for Transition of Care  Outcome: Ongoing - Unchanged  Goal: Rounds/Family Conference  Outcome: Ongoing - Unchanged     Problem: Fall Injury Risk  Goal: Absence of Fall and Fall-Related Injury  Outcome: Ongoing - Unchanged  Intervention: Promote Injury-Free Environment  Recent Flowsheet Documentation  Taken 12/06/2023 0712 by Dasie Epps, RN  Safety Interventions:   environmental modification   infection management   isolation precautions   lighting adjusted for tasks/safety   low bed   muscle strengthening facilitated   nonskid shoes/slippers when out of bed     Problem: Comorbidity Management  Goal: Maintenance of Asthma Control  Outcome: Ongoing - Unchanged  Goal: Maintenance of Behavioral Health Symptom Control  Outcome: Ongoing - Unchanged  Goal: Maintenance of COPD Symptom Control  Outcome: Ongoing - Unchanged  Goal: Blood Glucose Levels Within Targeted Range  Outcome: Ongoing - Unchanged  Goal: Maintenance of Heart Failure Symptom Control  Outcome: Ongoing - Unchanged  Goal: Blood Pressure in Desired Range  Outcome: Ongoing - Unchanged  Goal: Maintenance of Osteoarthritis Symptom Control  Outcome: Ongoing - Unchanged  Goal: Bariatric Home Regimen Maintained  Outcome: Ongoing - Unchanged  Goal: Maintenance of Seizure Control  Outcome: Ongoing - Unchanged     Problem: Pain Acute  Goal: Optimal Pain Control and Function  Outcome: Ongoing - Unchanged

## 2023-12-07 DIAGNOSIS — C9100 Acute lymphoblastic leukemia not having achieved remission: Principal | ICD-10-CM

## 2023-12-07 LAB — CBC W/ AUTO DIFF
BASOPHILS ABSOLUTE COUNT: 0 10*9/L (ref 0.0–0.1)
BASOPHILS RELATIVE PERCENT: 0.2 %
EOSINOPHILS ABSOLUTE COUNT: 0.1 10*9/L (ref 0.0–0.5)
EOSINOPHILS RELATIVE PERCENT: 1.7 %
HEMATOCRIT: 30.6 % — ABNORMAL LOW (ref 34.0–44.0)
HEMOGLOBIN: 11 g/dL — ABNORMAL LOW (ref 11.3–14.9)
LYMPHOCYTES ABSOLUTE COUNT: 1.2 10*9/L (ref 1.1–3.6)
LYMPHOCYTES RELATIVE PERCENT: 14.6 %
MEAN CORPUSCULAR HEMOGLOBIN CONC: 36 g/dL (ref 32.0–36.0)
MEAN CORPUSCULAR HEMOGLOBIN: 35.3 pg — ABNORMAL HIGH (ref 25.9–32.4)
MEAN CORPUSCULAR VOLUME: 98.2 fL — ABNORMAL HIGH (ref 77.6–95.7)
MEAN PLATELET VOLUME: 9.1 fL (ref 6.8–10.7)
MONOCYTES ABSOLUTE COUNT: 0.4 10*9/L (ref 0.3–0.8)
MONOCYTES RELATIVE PERCENT: 4.4 %
NEUTROPHILS ABSOLUTE COUNT: 6.6 10*9/L (ref 1.8–7.8)
NEUTROPHILS RELATIVE PERCENT: 79.1 %
PLATELET COUNT: 123 10*9/L — ABNORMAL LOW (ref 150–450)
RED BLOOD CELL COUNT: 3.11 10*12/L — ABNORMAL LOW (ref 3.95–5.13)
RED CELL DISTRIBUTION WIDTH: 17.5 % — ABNORMAL HIGH (ref 12.2–15.2)
WBC ADJUSTED: 8.4 10*9/L (ref 3.6–11.2)

## 2023-12-07 LAB — COMPREHENSIVE METABOLIC PANEL
ALBUMIN: 3.6 g/dL (ref 3.4–5.0)
ALKALINE PHOSPHATASE: 89 U/L (ref 46–116)
ALT (SGPT): 83 U/L — ABNORMAL HIGH (ref 10–49)
ANION GAP: 8 mmol/L (ref 5–14)
AST (SGOT): 37 U/L — ABNORMAL HIGH (ref <=34)
BILIRUBIN TOTAL: 0.6 mg/dL (ref 0.3–1.2)
BLOOD UREA NITROGEN: 15 mg/dL (ref 9–23)
BUN / CREAT RATIO: 31
CALCIUM: 9.3 mg/dL (ref 8.7–10.4)
CHLORIDE: 102 mmol/L (ref 98–107)
CO2: 24 mmol/L (ref 20.0–31.0)
CREATININE: 0.49 mg/dL — ABNORMAL LOW (ref 0.55–1.02)
EGFR CKD-EPI (2021) FEMALE: >90 mL/min/1.73m2 (ref >=60)
GLUCOSE RANDOM: 206 mg/dL — ABNORMAL HIGH (ref 70–179)
POTASSIUM: 4.4 mmol/L (ref 3.4–4.8)
PROTEIN TOTAL: 6 g/dL (ref 5.7–8.2)
SODIUM: 134 mmol/L — ABNORMAL LOW (ref 135–145)

## 2023-12-07 LAB — HEMATOPATHOLOGY LEUKEMIA/LYMPHOMA FLOW CYTOMETRY, CSF
LYMPHS CSF: 80.5 %
MONO/MACROPHAGE CSF: 2.4 %
NUCLEATED CELLS, CSF: 7 ul — ABNORMAL HIGH (ref <=5)
NUMBER OF CELLS CSF: 41
OTHER  CELLS CSF: 17.1 %
RBC CSF: 4 ul — ABNORMAL HIGH (ref <2)

## 2023-12-07 LAB — MAGNESIUM: MAGNESIUM: 1.9 mg/dL (ref 1.6–2.6)

## 2023-12-07 LAB — PROTIME-INR
INR: 0.95
PROTIME: 10.8 s (ref 9.9–12.6)

## 2023-12-07 LAB — PHOSPHORUS: PHOSPHORUS: 3.3 mg/dL (ref 2.4–5.1)

## 2023-12-07 MED ADMIN — midazolam (VERSED) injection 1 mg: 1 mg | INTRAVENOUS | @ 19:00:00 | Stop: 2023-12-07

## 2023-12-07 MED ADMIN — bisacodyl (DULCOLAX) suppository 10 mg: 10 mg | RECTAL | @ 03:00:00 | Stop: 2023-12-06

## 2023-12-07 MED ADMIN — insulin lispro (HumaLOG) injection 0-6 Units: 0-6 [IU] | SUBCUTANEOUS | @ 21:00:00

## 2023-12-07 MED ADMIN — losartan (COZAAR) tablet 50 mg: 50 mg | ORAL | @ 13:00:00

## 2023-12-07 MED ADMIN — cytarabine (PF) (ARA-C) 100 mg, hydrocortisone sod succ (Solu-CORTEF) 50 mg in sodium chloride (NS) 0.9 % 6 mL INTRATHECAL syringe: INTRATHECAL | @ 20:00:00 | Stop: 2023-12-07

## 2023-12-07 MED ADMIN — escitalopram oxalate (LEXAPRO) tablet 10 mg: 10 mg | ORAL | @ 13:00:00 | Stop: 2023-12-25

## 2023-12-07 MED ADMIN — valACYclovir (VALTREX) tablet 500 mg: 500 mg | ORAL | @ 01:00:00 | Stop: 2023-12-24

## 2023-12-07 MED ADMIN — insulin NPH (HumuLIN,NovoLIN) injection 13 Units: 13 [IU] | SUBCUTANEOUS | @ 13:00:00

## 2023-12-07 MED ADMIN — insulin lispro (HumaLOG) injection 0-6 Units: 0-6 [IU] | SUBCUTANEOUS | @ 02:00:00

## 2023-12-07 MED ADMIN — polyethylene glycol (MIRALAX) packet 17 g: 17 g | ORAL | @ 14:00:00

## 2023-12-07 MED ADMIN — letermovir (PREVYMIS) tablet 480 mg: 480 mg | ORAL | @ 01:00:00 | Stop: 2023-12-25

## 2023-12-07 MED ADMIN — PONATinib (ICLUSIG) tablet 30 mg ***PATIENT SUPPLIED***: 30 mg | ORAL | @ 01:00:00

## 2023-12-07 MED ADMIN — aspirin chewable tablet 81 mg: 81 mg | ORAL | @ 13:00:00

## 2023-12-07 MED ADMIN — cetirizine (ZYRTEC) tablet 10 mg: 10 mg | ORAL | @ 13:00:00

## 2023-12-07 MED ADMIN — insulin lispro (HumaLOG) injection 0-6 Units: 0-6 [IU] | SUBCUTANEOUS | @ 16:00:00

## 2023-12-07 MED ADMIN — insulin lispro (HumaLOG) injection 0-6 Units: 0-6 [IU] | SUBCUTANEOUS | @ 13:00:00

## 2023-12-07 MED ADMIN — dapsone tablet 100 mg: 100 mg | ORAL | @ 13:00:00 | Stop: 2023-12-25

## 2023-12-07 MED ADMIN — insulin NPH (HumuLIN,NovoLIN) injection 13 Units: 13 [IU] | SUBCUTANEOUS | @ 02:00:00

## 2023-12-07 NOTE — Unmapped (Signed)
 Problem: Adult Inpatient Plan of Care  Goal: Absence of Hospital-Acquired Illness or Injury  Outcome: Progressing  Intervention: Identify and Manage Fall Risk  Recent Flowsheet Documentation  Taken 12/06/2023 2000 by Junie Olds, RN  Safety Interventions: fall reduction program maintained  Intervention: Prevent Infection  Recent Flowsheet Documentation  Taken 12/06/2023 2000 by Junie Olds, RN  Infection Prevention:   hand hygiene promoted   cohorting utilized   environmental surveillance performed   personal protective equipment utilized   rest/sleep promoted   single patient room provided  Goal: Optimal Comfort and Wellbeing  Outcome: Progressing     Problem: Fall Injury Risk  Goal: Absence of Fall and Fall-Related Injury  Outcome: Progressing  Intervention: Promote Scientist, clinical (histocompatibility and immunogenetics) Documentation  Taken 12/06/2023 2000 by Junie Olds, RN  Safety Interventions: fall reduction program maintained     Problem: Comorbidity Management  Goal: Maintenance of Asthma Control  Outcome: Progressing  Goal: Blood Glucose Levels Within Targeted Range  Outcome: Progressing  Goal: Blood Pressure in Desired Range  Outcome: Progressing     Problem: Pain Acute  Goal: Optimal Pain Control and Function  Outcome: Progressing

## 2023-12-07 NOTE — Unmapped (Signed)
 Lumbar Puncture Procedure with ultrasound guidance (CPT [phone]) with Intrathecal Chemo Administration (CPT [phone])    Pre-procedural Planning     Patient Name:: Patricia [name]  Patient MRN: [id number]    Pre-operative Diagnosis:  Administration of intrathecal chemo    Post-operative Diagnosis: Administration of intrathecal chemo    Indications:  Administration of intrathecal chemo    Contraindications to performing lumbar puncture: Patient had no contraindications. Specifically, no local skin infections over the proposed puncture site, known elevated ICP other than in idiopathic intracranial hypertension or cryptococcal meningitis, and/or an uncontrolled bleeding diathesis.     Imaging prior to lumbar puncture: Generally, imaging should be obtained prior to performing a lumbar puncture if the patient is > 53 years old, immunocompromised, has had a seizure within one week of presentation, has an abnormal level of consciousness, or an abnormal neurologic exam (including papilledema). CNS imaging has been obtained and does not indicate increased ICP.    Known Bleeding Diathesis: Patient/caregiver denies any known bleeding or platelet disorder.     Antiplatelet Agents: This patient is not on an antiplatelet agent.    Systemic Anticoagulation: This patient is not on full systemic anticoagulation.    Significant Labs:  INR   Date Value Ref Range Status   12/07/2023 0.95  Final     PT   Date Value Ref Range Status   12/07/2023 10.8 9.9 - 12.6 sec Final     APTT   Date Value Ref Range Status   11/24/2023 117.8 (H) 24.8 - 38.4 sec Final     Platelet   Date Value Ref Range Status   12/07/2023 123 (L) 150 - 450 10*9/L Final       Consent: Informed consent for the procedure was obtained from Patient after explanation of the risks, including bleeding into pain, post-lumbar puncture headache, backache, bleeding that could result in paralysis, herniation and infection, and benefits, including symptomatic improvement and diagnostic information. Potential alternatives were discussed and all questions answered.  Refer to the consent documentation.     Procedure Details     Time-out performed immediately prior to the procedure. If IT chemo given, 2 providers checked chemo orders prior to beginning procedure.    Patient was placed in the sitting position with hips and neck in flexion.    The superior aspect of the iliac crests were identified, with the traverse demarcating the L4-L5 interspace.  A bedside ultrasound was used to help identify the spinous processes at L2, L3, and L4 and the intervertebral spaces were located and marked.          This area was prepped with chlorhexidine and draped in the usual sterile fashion. Sterile technique was used including antiseptics, cap, gloves, hand hygiene, mask, and sterile sheet.  Local anesthesia with 1% lidocaine  was applied subcutaneously then deep to the skin. The 22 gauge cutting (eg. Quincke) spinal needle with trocar was introduced at the L2 - L3 interspace.  CSF was obtained at 7 cm after 2 pass(es),  redirections.  When CSF fluid was noted an opening pressure was not obtained.  Samples were collected and sent to the lab after proper labeling.     Intrathecal cytarabine  (Ara-C) was administered slowly over 5 min.    The spinal needle with trocar was removed with minimal bleeding noted upon removal. A sterile bandage was placed over the puncture site after holding pressure.    Findings     6 mL of clear spinal fluid was obtained.  The  CSF was sent to lab for routine studies including cell count, protein, glucose, hematopathology.          Condition     The patient tolerated the procedure well and remains in the same condition as pre-procedure.    Complications and Recommendations     None; patient tolerated the procedure well.      Requesting Service: Oncology/Hematology (MDE)    Time Requested:   Time Completed: 1600  Comments:     Resident(s) Performing Procedure: Lindalee Retort MD  Resident Year: PGY1    Mardee Shackle, MD was present and supervised this procedure.

## 2023-12-07 NOTE — Unmapped (Signed)
 Pt alert and oriented x4. Pt remained afebrile throughout shift, VSS. Intrathecal cytarabine  was done today, pt received versed  before.  All safety measures in place throughout shift.      Problem: Adult Inpatient Plan of Care  Goal: Plan of Care Review  Outcome: Ongoing - Unchanged  Goal: Patient-Specific Goal (Individualized)  Outcome: Ongoing - Unchanged  Goal: Absence of Hospital-Acquired Illness or Injury  Outcome: Ongoing - Unchanged  Intervention: Identify and Manage Fall Risk  Recent Flowsheet Documentation  Taken 12/07/2023 0717 by Dasie Epps, RN  Safety Interventions:   environmental modification   family at bedside   infection management   isolation precautions   lighting adjusted for tasks/safety   low bed   muscle strengthening facilitated   nonskid shoes/slippers when out of bed   room near unit station  Intervention: Prevent Skin Injury  Recent Flowsheet Documentation  Taken 12/07/2023 0845 by Dasie Epps, RN  Positioning for Skin: Supine/Back  Taken 12/07/2023 0717 by Dasie Epps, RN  Positioning for Skin: Left  Intervention: Prevent Infection  Recent Flowsheet Documentation  Taken 12/07/2023 1610 by Dasie Epps, RN  Infection Prevention:   cohorting utilized   environmental surveillance performed   equipment surfaces disinfected   hand hygiene promoted   personal protective equipment utilized   rest/sleep promoted   single patient room provided   visitors restricted/screened  Goal: Optimal Comfort and Wellbeing  Outcome: Ongoing - Unchanged  Goal: Readiness for Transition of Care  Outcome: Ongoing - Unchanged  Goal: Rounds/Family Conference  Outcome: Ongoing - Unchanged     Problem: Fall Injury Risk  Goal: Absence of Fall and Fall-Related Injury  Outcome: Ongoing - Unchanged  Intervention: Promote Injury-Free Environment  Recent Flowsheet Documentation  Taken 12/07/2023 0717 by Dasie Epps, RN  Safety Interventions:   environmental modification   family at bedside infection management   isolation precautions   lighting adjusted for tasks/safety   low bed   muscle strengthening facilitated   nonskid shoes/slippers when out of bed   room near unit station     Problem: Comorbidity Management  Goal: Maintenance of Asthma Control  Outcome: Ongoing - Unchanged  Goal: Maintenance of Behavioral Health Symptom Control  Outcome: Ongoing - Unchanged  Goal: Maintenance of COPD Symptom Control  Outcome: Ongoing - Unchanged  Goal: Blood Glucose Levels Within Targeted Range  Outcome: Ongoing - Unchanged  Goal: Maintenance of Heart Failure Symptom Control  Outcome: Ongoing - Unchanged  Goal: Blood Pressure in Desired Range  Outcome: Ongoing - Unchanged  Goal: Maintenance of Osteoarthritis Symptom Control  Outcome: Ongoing - Unchanged  Goal: Bariatric Home Regimen Maintained  Outcome: Ongoing - Unchanged  Goal: Maintenance of Seizure Control  Outcome: Ongoing - Unchanged     Problem: Pain Acute  Goal: Optimal Pain Control and Function  Outcome: Ongoing - Unchanged

## 2023-12-07 NOTE — Unmapped (Signed)
 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 Started on IT methotrexate  and dexamethasone .     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 Started on biweekly IT methotrexate . MDR positive  Mon/Thurs schedule for LP with IT MTX.   Next treatment will be today, 5/8  Lovenox  held. Will restart 5/9.  Headache, improved: dexamethasone  STOPPED 5/5  Ponatanib restarted 5/1 due to disease progression  Monitor LFTs daily, improving  Continue ASA 81mg  daily for arterial clot ppx  Continue enoxaparin  40mg  subQ for DVT ppx (hold day of LP)  Prophylaxis:valtrex , Prevymis   MDR positive. Will plan to start blinatumomab 5/8 or 5/9 if CSF cleared on LP.     Intermittent Tachycardia  Starting 5/4 patient having intermittent episodes of tachycardia (130-160 bpm). The episodes were found during vital checks and on telemetry. EKG x2 showed normal sinus rhythm with normal rate. Electrolytes are stable. Patient asymptomatic during episodes. Evaluated by cardiology with low concern for afib. Advised to avoid anticoagulation iso weekly lumbar punctures. Echocardiogram stable.   - Telemetry stopped.   - K+ >4 Mag >2    Hypertension  History of chronic hypertension. Uptitrated to home dosage of Losartan  50 mg 5/4.   Continue PO Losartan  50 mg (home dose)  Continue Labetalol  PRN SBP>180 or DBP>120     Acute Liver Injury - C/f Tylenol  Toxicity, improving   On admission found to have acutely elevated  ALT and AST. Liver doppler 4/26 without signs of veno-occlusive leukemic infiltration or portal vein thrombosis. Acute hepatitis panel negative. HIV negative. Trending labs with daily improvement.   Trend hepatic function panel   Trend PT-INR, CBC/Diff, CMP/Mg/Phos  Monitor LFTs after ponatanib resumption     T2DM  Suspected elevation in BG during course of steroids. Will CTM with steriod taper.   NPH 13u BID   Increased SSI to SF 30    Neck pain  CT of Cervical spine at OSH hospital showed degenerative disc disease at C5-C6 with no spinal cord compression. Patient states she has increased pain in her neck from lying in bed all day. It improves with movement and muscle relaxers. No numbness or tingling. Improves with voltaren  gel and cold packs. Will continue to monitor symptoms   - Voltaren  gel prn   - Ice Packs   - PRN Flexeril        Chronic Problems  HLD: HOLD rosuvastatin  10 mg nightly     Impending Electrolyte Abnormality Secondary to Chemotherapy and/or IV Fluids  -Daily Electrolyte monitoring  -Replete per [facility] guidelines.     Immunocompromised status: Patient is immunocompromised secondary to disease and chemotherapy  -Antimicrobial prophylaxis as above    Impending Pancytopenia secondary to Acute Leukemia and chemotherapy:   -  Transfuse hgb <7  - Transfuse plt <10K    Nutrition: Diabetic                        Subjective:     Interval History:  [name]. She had a suppository last night to help go to the bathroom and is feeling much better after that. Understanidng of plan to start blinatumomab when the LP is clear.     10 point ROS otherwise negative except as above in the HPI.     Objective:     Vital signs in last 24 hours:  Temp:  [36.7 ??C (98.1 ??F)-37.1 ??C (98.7 ??F)] 36.7 ??C (98.1 ??F)  Pulse:  [92-114] 104  Resp:  [16-18] 16  BP: (99-126)/(75-87) 99/75  MAP (mmHg):  [82-98] 82  SpO2:  [94 %-97 %] 95 %    Intake/Output last 3 shifts:  I/O last 3 completed shifts:  In: 955 [P.O.:955]  Out: -     Meds:  Current Facility-Administered Medications   Medication Dose Route Frequency Provider Last Rate Last Admin    albuterol  (PROVENTIL  HFA;VENTOLIN  HFA) 90 mcg/actuation inhaler 2 puff  2 puff Inhalation Q6H PRN Daphene Dys, MD        aspirin  chewable tablet 81 mg  81 mg Oral Daily Kitay, Ilona, MD   81 mg at 12/01/23 1048    bismuth  subsalicylate (PEPTO-BISMOL) oral suspension  30 mL Oral Q6H PRN Rudloff, Michael W, MD   30 mL at 11/25/23 1648    cetirizine  (ZYRTEC ) tablet 10 mg  10 mg Oral Daily Rudloff, Michael W, MD   10 mg at 12/01/23 1610    CHEMO CLARIFICATION ORDER   Other Continuous PRN Stacey Dyke, MD        CHEMO CLARIFICATION ORDER   Other Continuous PRN Stacey Dyke, MD        cyclobenzaprine  (FLEXERIL ) tablet 10 mg  10 mg Oral TID PRN Lynde Ludwig C, DO   10 mg at 11/30/23 2019    dapsone  tablet 100 mg  100 mg Oral Daily Daphene Dys, MD   100 mg at 12/01/23 9604    dexAMETHasone  (DECADRON ) tablet 4 mg  4 mg Oral Q12H [facility] Kitay, Ilona, MD        dextrose  50 % in water (D50W) 50 % solution 12.5 g  12.5 g Intravenous Q15 Min PRN Cookmeyer, Helga Loan, MD        diclofenac  sodium (VOLTAREN ) 1 % gel 4 g  4 g Topical QID Loletha Ripper, MD   4 g at 11/29/23 1133    diphenhydrAMINE  (BENADRYL ) injection  25 mg Intravenous Q6H PRN Cornett, Fletcher Humble, MD   25 mg at 11/30/23 0858    enoxaparin  (LOVENOX ) syringe 40 mg  40 mg Subcutaneous Q24H [facility] Kitay, Ilona, MD   40 mg at 12/01/23 1048    escitalopram  oxalate (LEXAPRO ) tablet 10 mg  10 mg Oral Daily Daphene Dys, MD   10 mg at 12/01/23 5409    glucagon injection 1 mg  1 mg Intramuscular Once PRN Cookmeyer, Helga Loan, MD        glucose chewable tablet 16 g  16 g Oral Q10 Min PRN Cookmeyer, Helga Loan, MD        insulin  lispro (HumaLOG ) injection 0-6 Units  0-6 Units Subcutaneous ACHS Loletha Ripper, MD   1 Units at 12/01/23 8119    insulin  lispro (HumaLOG ) injection 7 Units  7  Units Subcutaneous 3xd Meals Lichtman, Ezra S, MD   7 Units at 12/01/23 0830    insulin  NPH (HumuLIN ,NovoLIN ) injection 17 Units  17 Units Subcutaneous Q12H [facility] Lichtman, Ezra S, MD   17 Units at 12/01/23 0831    IP OKAY TO TREAT   Other Continuous PRN Stacey Dyke, MD        IP OKAY TO TREAT   Other Continuous PRN Stacey Dyke, MD        labetalol  (NORMODYNE ) injection  10 mg Intravenous Q6H PRN Teressa Ferdinand, MD   10 mg at 11/29/23 1640    letermovir  (PREVYMIS ) tablet 480 mg  480 mg Oral Daily Daphene Dys, MD   480 mg at 11/30/23 2021    lidocaine  (ASPERCREME) 4 % 1 patch  1 patch Transdermal Daily Zewdu, Winthrop Hawks, MD        losartan  (COZAAR ) tablet 25 mg  25 mg Oral Daily Rudloff, Michael W, MD   25 mg at 12/01/23 0833    metoclopramide  (REGLAN ) injection 10 mg  10 mg Intravenous Q6H PRN Cornett, Fletcher Humble, MD   10 mg at 11/30/23 7124    midazolam  (VERSED ) injection 2 mg  2 mg Intravenous Once PRN Beverlyn Buckles, MD        oxyCODONE  (ROXICODONE ) immediate release tablet 5 mg  5 mg Oral Q4H PRN Cookmeyer, Helga Loan, MD        senna (SENOKOT) tablet 2 tablet  2 tablet Oral Nightly PRN Cornett, Fletcher Humble, MD        Or    polyethylene glycol (MIRALAX ) packet 17 g  17 g Oral Daily PRN Cornett, Fletcher Humble, MD        PONATinib  (ICLUSIG ) tablet 30 mg PATIENT SUPPLIED  30 mg Oral Daily Stacey Dyke, MD   30 mg at 11/30/23 2129    prochlorperazine  (COMPAZINE ) tablet 10 mg  10 mg Oral Q6H PRN Avery, Natalia, DO        Or    prochlorperazine  (COMPAZINE ) injection 10 mg  10 mg Intravenous Q6H PRN Avery, Natalia, DO   10 mg at 11/27/23 1847    valACYclovir  (VALTREX ) tablet 500 mg  500 mg Oral Daily Teressa Ferdinand, MD   500 mg at 11/30/23 2021       Physical Exam:  General: Resting, in no apparent distress, sitting in bed.  HEENT:  PERRL. EOMI. Normocephalic, atraumatic  Heart:  RRR, no m/r/g appreciated  Lungs:  Normal WOB on room air. CTAB.  Abdomen:  No distention or pain on palpation.    Skin:  No lesions on clothed exam.   Psychiatric:  Range of affect is appropriate.    Neurologic:  Alert and oriented to person, place, time and situation.  CNII-CNXII grossly intact.  Extremities:  Appear well-perfused. No clubbing, edema, or cyanosis.      Labs:  Recent Labs     12/05/23  0442 12/06/23  0145 12/07/23  0711   WBC 12.0* 8.3 8.4   NEUTROABS 9.1* 6.0 6.6   LYMPHSABS 1.9 1.7 1.2   HGB 12.0 11.8 11.0*   HCT 35.1 33.1* 30.6*   PLT 153 146* 123*   CREATININE 0.52* 0.62 0.49*   BUN 33* 29* 15   BILITOT 0.8 0.7 0.6   AST 29 28 37*   ALT 103* 91* 83*   ALKPHOS 110 102 89   K 4.3 4.2 4.4   MG 1.9 1.9 1.9   CALCIUM  9.7 9.6  9.3   NA 139 138 134*   CL 105 104 102   CO2 29.0 17.0* 24.0   PHOS 3.5 4.1 3.3   INR 0.93 0.97  --    IGG 542*  --   --        Imaging:  No new.      Dareen Ebbing, [employer] 

## 2023-12-08 DIAGNOSIS — C9100 Acute lymphoblastic leukemia not having achieved remission: Principal | ICD-10-CM

## 2023-12-08 LAB — COMPREHENSIVE METABOLIC PANEL
ALBUMIN: 3.8 g/dL (ref 3.4–5.0)
ALKALINE PHOSPHATASE: 93 U/L (ref 46–116)
ALT (SGPT): 97 U/L — ABNORMAL HIGH (ref 10–49)
ANION GAP: 7 mmol/L (ref 5–14)
AST (SGOT): 33 U/L (ref <=34)
BILIRUBIN TOTAL: 0.8 mg/dL (ref 0.3–1.2)
BLOOD UREA NITROGEN: 18 mg/dL (ref 9–23)
BUN / CREAT RATIO: 39
CALCIUM: 9.7 mg/dL (ref 8.7–10.4)
CHLORIDE: 104 mmol/L (ref 98–107)
CO2: 28 mmol/L (ref 20.0–31.0)
CREATININE: 0.46 mg/dL — ABNORMAL LOW (ref 0.55–1.02)
EGFR CKD-EPI (2021) FEMALE: >90 mL/min/1.73m2 (ref >=60)
GLUCOSE RANDOM: 262 mg/dL — ABNORMAL HIGH (ref 70–179)
POTASSIUM: 4 mmol/L (ref 3.4–4.8)
PROTEIN TOTAL: 6.5 g/dL (ref 5.7–8.2)
SODIUM: 139 mmol/L (ref 135–145)

## 2023-12-08 LAB — CBC W/ AUTO DIFF
BASOPHILS ABSOLUTE COUNT: 0 10*9/L (ref 0.0–0.1)
BASOPHILS RELATIVE PERCENT: 0.1 %
EOSINOPHILS ABSOLUTE COUNT: 0.1 10*9/L (ref 0.0–0.5)
EOSINOPHILS RELATIVE PERCENT: 1.4 %
HEMATOCRIT: 32.7 % — ABNORMAL LOW (ref 34.0–44.0)
HEMOGLOBIN: 11.1 g/dL — ABNORMAL LOW (ref 11.3–14.9)
LYMPHOCYTES ABSOLUTE COUNT: 1.2 10*9/L (ref 1.1–3.6)
LYMPHOCYTES RELATIVE PERCENT: 14.3 %
MEAN CORPUSCULAR HEMOGLOBIN CONC: 33.9 g/dL (ref 32.0–36.0)
MEAN CORPUSCULAR HEMOGLOBIN: 33.2 pg — ABNORMAL HIGH (ref 25.9–32.4)
MEAN CORPUSCULAR VOLUME: 98.1 fL — ABNORMAL HIGH (ref 77.6–95.7)
MEAN PLATELET VOLUME: 8.9 fL (ref 6.8–10.7)
MONOCYTES ABSOLUTE COUNT: 0.4 10*9/L (ref 0.3–0.8)
MONOCYTES RELATIVE PERCENT: 4.9 %
NEUTROPHILS ABSOLUTE COUNT: 6.9 10*9/L (ref 1.8–7.8)
NEUTROPHILS RELATIVE PERCENT: 79.3 %
PLATELET COUNT: 131 10*9/L — ABNORMAL LOW (ref 150–450)
RED BLOOD CELL COUNT: 3.34 10*12/L — ABNORMAL LOW (ref 3.95–5.13)
RED CELL DISTRIBUTION WIDTH: 18.1 % — ABNORMAL HIGH (ref 12.2–15.2)
WBC ADJUSTED: 8.7 10*9/L (ref 3.6–11.2)

## 2023-12-08 LAB — PROTIME-INR
INR: 0.87
PROTIME: 9.9 s (ref 9.9–12.6)

## 2023-12-08 LAB — PHOSPHORUS: PHOSPHORUS: 2.7 mg/dL (ref 2.4–5.1)

## 2023-12-08 LAB — MAGNESIUM: MAGNESIUM: 2.1 mg/dL (ref 1.6–2.6)

## 2023-12-08 MED ADMIN — insulin lispro (HumaLOG) injection 0-6 Units: 0-6 [IU] | SUBCUTANEOUS | @ 20:00:00

## 2023-12-08 MED ADMIN — cetirizine (ZYRTEC) tablet 10 mg: 10 mg | ORAL | @ 12:00:00

## 2023-12-08 MED ADMIN — aspirin chewable tablet 81 mg: 81 mg | ORAL | @ 12:00:00

## 2023-12-08 MED ADMIN — letermovir (PREVYMIS) tablet 480 mg: 480 mg | ORAL | @ 01:00:00 | Stop: 2023-12-25

## 2023-12-08 MED ADMIN — insulin lispro (HumaLOG) injection 0-6 Units: 0-6 [IU] | SUBCUTANEOUS | @ 03:00:00

## 2023-12-08 MED ADMIN — losartan (COZAAR) tablet 50 mg: 50 mg | ORAL | @ 12:00:00 | Stop: 2023-12-08

## 2023-12-08 MED ADMIN — insulin NPH (HumuLIN,NovoLIN) injection 13 Units: 13 [IU] | SUBCUTANEOUS | @ 03:00:00

## 2023-12-08 MED ADMIN — valACYclovir (VALTREX) tablet 500 mg: 500 mg | ORAL | @ 01:00:00 | Stop: 2023-12-24

## 2023-12-08 MED ADMIN — insulin NPH (HumuLIN,NovoLIN) injection 13 Units: 13 [IU] | SUBCUTANEOUS | @ 12:00:00 | Stop: 2023-12-08

## 2023-12-08 MED ADMIN — insulin lispro (HumaLOG) injection 0-6 Units: 0-6 [IU] | SUBCUTANEOUS | @ 12:00:00

## 2023-12-08 MED ADMIN — PONATinib (ICLUSIG) tablet 30 mg ***PATIENT SUPPLIED***: 30 mg | ORAL | @ 01:00:00

## 2023-12-08 MED ADMIN — insulin lispro (HumaLOG) injection 0-6 Units: 0-6 [IU] | SUBCUTANEOUS | @ 16:00:00

## 2023-12-08 MED ADMIN — escitalopram oxalate (LEXAPRO) tablet 10 mg: 10 mg | ORAL | @ 12:00:00 | Stop: 2023-12-25

## 2023-12-08 MED ADMIN — enoxaparin (LOVENOX) syringe 40 mg: 40 mg | SUBCUTANEOUS | @ 20:00:00 | Stop: 2023-12-10

## 2023-12-08 MED ADMIN — dapsone tablet 100 mg: 100 mg | ORAL | @ 12:00:00 | Stop: 2023-12-25

## 2023-12-08 NOTE — Unmapped (Signed)
 Problem: Adult Inpatient Plan of Care  Goal: Plan of Care Review  Outcome: Ongoing - Unchanged  Goal: Patient-Specific Goal (Individualized)  Outcome: Ongoing - Unchanged  Goal: Absence of Hospital-Acquired Illness or Injury  Outcome: Ongoing - Unchanged  Intervention: Prevent Skin Injury  Recent Flowsheet Documentation  Taken 12/07/2023 2000 by Albertha Huger, RN  Positioning for Skin: Supine/Back  Device Skin Pressure Protection: adhesive use limited  Skin Protection: adhesive use limited  Intervention: Prevent Infection  Recent Flowsheet Documentation  Taken 12/07/2023 2000 by Albertha Huger, RN  Infection Prevention: cohorting utilized  Goal: Optimal Comfort and Wellbeing  Outcome: Ongoing - Unchanged  Goal: Readiness for Transition of Care  Outcome: Ongoing - Unchanged  Goal: Rounds/Family Conference  Outcome: Ongoing - Unchanged     Problem: Fall Injury Risk  Goal: Absence of Fall and Fall-Related Injury  Outcome: Ongoing - Unchanged     Problem: Comorbidity Management  Goal: Maintenance of Asthma Control  Outcome: Ongoing - Unchanged  Goal: Maintenance of Behavioral Health Symptom Control  Outcome: Ongoing - Unchanged  Goal: Maintenance of COPD Symptom Control  Outcome: Ongoing - Unchanged  Goal: Blood Glucose Levels Within Targeted Range  Outcome: Ongoing - Unchanged  Goal: Maintenance of Heart Failure Symptom Control  Outcome: Ongoing - Unchanged  Goal: Blood Pressure in Desired Range  Outcome: Ongoing - Unchanged  Goal: Maintenance of Osteoarthritis Symptom Control  Outcome: Ongoing - Unchanged  Intervention: Maintain Osteoarthritis Symptom Control  Recent Flowsheet Documentation  Taken 12/07/2023 2000 by Albertha Huger, RN  Activity Management: up ad lib  Goal: Bariatric Home Regimen Maintained  Outcome: Ongoing - Unchanged  Goal: Maintenance of Seizure Control  Outcome: Ongoing - Unchanged     Problem: Pain Acute  Goal: Optimal Pain Control and Function  Outcome: Ongoing - Unchanged

## 2023-12-08 NOTE — Unmapped (Signed)
 Patient AOx4, VSS, afebrile. Patient did not receive any PRN medications this shift. No falls or acute events this shift. Plan of Care reviewed with patient.  Safety measures in place of bed low with brakes locked, non-skid footwear on while out of bed, call bell within reach.        Problem: Adult Inpatient Plan of Care  Goal: Plan of Care Review  Outcome: Ongoing - Unchanged  Goal: Patient-Specific Goal (Individualized)  Outcome: Ongoing - Unchanged  Goal: Absence of Hospital-Acquired Illness or Injury  Outcome: Ongoing - Unchanged  Intervention: Identify and Manage Fall Risk  Recent Flowsheet Documentation  Taken 12/08/2023 0727 by Mylo Asai, RN  Safety Interventions:   bleeding precautions   chemotherapeutic agent precautions   environmental modification   fall reduction program maintained   infection management   isolation precautions   lighting adjusted for tasks/safety   neutropenic precautions   nonskid shoes/slippers when out of bed  Intervention: Prevent Skin Injury  Recent Flowsheet Documentation  Taken 12/08/2023 0829 by Mylo Asai, RN  Positioning for Skin: Sitting in Chair  Taken 12/08/2023 1610 by Mylo Asai, RN  Positioning for Skin: Supine/Back  Intervention: Prevent Infection  Recent Flowsheet Documentation  Taken 12/08/2023 9604 by Mylo Asai, RN  Infection Prevention: cohorting utilized  Goal: Optimal Comfort and Wellbeing  Outcome: Ongoing - Unchanged  Goal: Readiness for Transition of Care  Outcome: Ongoing - Unchanged  Goal: Rounds/Family Conference  Outcome: Ongoing - Unchanged     Problem: Fall Injury Risk  Goal: Absence of Fall and Fall-Related Injury  Outcome: Ongoing - Unchanged  Intervention: Promote Injury-Free Environment  Recent Flowsheet Documentation  Taken 12/08/2023 0727 by Mylo Asai, RN  Safety Interventions:   bleeding precautions   chemotherapeutic agent precautions   environmental modification   fall reduction program maintained   infection management isolation precautions   lighting adjusted for tasks/safety   neutropenic precautions   nonskid shoes/slippers when out of bed     Problem: Comorbidity Management  Goal: Maintenance of Asthma Control  Outcome: Ongoing - Unchanged  Goal: Maintenance of Behavioral Health Symptom Control  Outcome: Ongoing - Unchanged  Goal: Maintenance of COPD Symptom Control  Outcome: Ongoing - Unchanged  Goal: Blood Glucose Levels Within Targeted Range  Outcome: Ongoing - Unchanged  Goal: Maintenance of Heart Failure Symptom Control  Outcome: Ongoing - Unchanged  Goal: Blood Pressure in Desired Range  Outcome: Ongoing - Unchanged  Goal: Maintenance of Osteoarthritis Symptom Control  Outcome: Ongoing - Unchanged  Intervention: Maintain Osteoarthritis Symptom Control  Recent Flowsheet Documentation  Taken 12/08/2023 0727 by Mylo Asai, RN  Activity Management: up ad lib  Goal: Bariatric Home Regimen Maintained  Outcome: Ongoing - Unchanged  Goal: Maintenance of Seizure Control  Outcome: Ongoing - Unchanged     Problem: Pain Acute  Goal: Optimal Pain Control and Function  Outcome: Ongoing - Unchanged

## 2023-12-08 NOTE — Unmapped (Signed)
 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 y/o female that presents to [facility] with 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)  .  Pt was seen at the request of Ernest Head, MD [facility] - Hematology Res Floor Team (MED E - Tower) /Oncology/Hematology (MDE)) in consultation for lumbar puncture    Uncomplicated lumbar puncture with intrathecal chemotherapy performed on 5/8. Please see procedure note for details. Doing well post procedure. Continues to have blasts noted in CSF. Discussed planning for next LP with primary team- they state they will let us  know if they would still appreciate our assistance or if LP would be coordinated with fluoro.    Thank you for this consult. We will sign off. Please re-consult the Medicine Procedure Service if we can be of further assistance.  ___________________________________________________________________    Subjective:   Doing well, no issues with LP site. States she has some pain in other areas related to easy bruising with the low platelets. She states she was told that moving forward her LPs would be done in the fluoro suite. She is disappointed in this. She also mentions she is disappointed and frustrated that there continued to be persistent blasts in her CSF. She is not sure if this changes the plan for her chemo yet.    Objective:   Vital signs in last 24 hours:  Temp:  [36.5 ??C (97.7 ??F)-36.9 ??C (98.4 ??F)] 36.6 ??C (97.9 ??F)  Pulse:  [84-113] 98  Resp:  [16] 16  BP: (96-130)/(61-100) 113/86  MAP (mmHg):  [72-108] 96  SpO2:  [94 %-97 %] 95 %    Intake/Output last 3 shifts:  No intake/output data recorded.    Physical Exam:  GEN: NAD, sitting in bed  EYES: EOMI, sclera clear  ENT: MMM  CHEST: port accessed in R upper chest  PULM: Comfortable WOB  BACK: tattoos over LP site, no signs of ecchymosis visible at the area and not TTP    Labs/Studies/Diagnostics:   Data Review:  All lab results last 24 hours:    Recent Results (from the past 24 hours)   CSF protein    Collection Time: 12/07/23  4:06 PM   Result Value Ref Range    Protein, CSF 36 20 - 59 mg/dL   Glucose, CSF    Collection Time: 12/07/23  4:06 PM   Result Value Ref Range    Glucose, CSF 148 (H) 48 - 79 mg/dL   H[facility]path Leukemia Flowcytometry, CSF    Collection Time: 12/07/23  4:06 PM   Result Value Ref Range    Tube # CSF Tube 1     Color, CSF Colorless     Appearance, CSF Clear     Nucleated Cells, CSF 7 (H) <=5 ul    RBC, CSF 4 (H) <2 ul    #  Cells counted for Diff 41     Lymphs %, CSF 80.5 %    Mono/Macrophage %, CSF 2.4 %    Other Cells %, CSF 17.1 %    Comment CSF Blasts Present.    POCT Glucose    Collection Time: 12/07/23  5:05 PM   Result Value Ref Range    Glucose, POC 249 (H) 70 - 179 mg/dL   POCT Glucose    Collection Time: 12/07/23 10:34 PM   Result Value Ref Range    Glucose, POC 385 (H) 70 - 179 mg/dL   Comprehensive Metabolic Panel    Collection Time: 12/08/23  4:12 AM   Result Value Ref Range    Sodium 139 135 - 145 mmol/L    Potassium 4.0 3.4 - 4.8 mmol/L    Chloride 104 98 - 107 mmol/L    CO2 28.0 20.0 - 31.0 mmol/L    Anion Gap 7 5 - 14 mmol/L    BUN 18 9 - 23 mg/dL    Creatinine 5.36 (L) 0.55 - 1.02 mg/dL    BUN/Creatinine Ratio 39     eGFR CKD-EPI (2021) Female >90 >=60 mL/min/1.48m2    Glucose 262 (H) 70 - 179 mg/dL    Calcium  9.7 8.7 - 10.4 mg/dL    Albumin 3.8 3.4 - 5.0 g/dL    Total Protein 6.5 5.7 - 8.2 g/dL    Total Bilirubin 0.8 0.3 - 1.2 mg/dL    AST 33 <=64 U/L    ALT 97 (H) 10 - 49 U/L    Alkaline Phosphatase 93 46 - 116 U/L   Magnesium  Level    Collection Time: 12/08/23  4:12 AM   Result Value Ref Range    Magnesium  2.1 1.6 - 2.6 mg/dL   Phosphorus Level    Collection Time: 12/08/23  4:12 AM   Result Value Ref Range    Phosphorus 2.7 2.4 - 5.1 mg/dL   PT-INR    Collection Time: 12/08/23  4:12 AM   Result Value Ref Range    PT 9.9 9.9 - 12.6 sec    INR 0.87    CBC w/ Differential    Collection Time: 12/08/23  4:12 AM   Result Value Ref Range    WBC 8.7 3.6 - 11.2 10*9/L    RBC 3.34 (L) 3.95 - 5.13 10*12/L    HGB 11.1 (L) 11.3 - 14.9 g/dL    HCT 40.3 (L) 47.4 - 44.0 %    MCV 98.1 (H) 77.6 - 95.7 fL    MCH 33.2 (H) 25.9 - 32.4 pg    MCHC 33.9 32.0 - 36.0 g/dL    RDW 25.9 (H) 56.3 - 15.2 %    MPV 8.9 6.8 - 10.7 fL    Platelet 131 (L) 150 - 450 10*9/L    Neutrophils % 79.3 %    Lymphocytes % 14.3 %    Monocytes % 4.9 %    Eosinophils % 1.4 %    Basophils % 0.1 %    Absolute Neutrophils 6.9 1.8 - 7.8 10*9/L    Absolute Lymphocytes 1.2 1.1 - 3.6 10*9/L    Absolute Monocytes 0.4 0.3 - 0.8 10*9/L    Absolute Eosinophils 0.1 0.0 - 0.5 10*9/L    Absolute Basophils 0.0 0.0 - 0.1 10*9/L    Anisocytosis Slight (A) Not Present   POCT Glucose    Collection Time: 12/08/23  7:35 AM   Result Value Ref Range    Glucose, POC 255 (H)  70 - 179 mg/dL   POCT Glucose    Collection Time: 12/08/23 11:52 AM   Result Value Ref Range    Glucose, POC 306 (H) 70 - 179 mg/dL       Current Hospital Medications:   Scheduled Meds:   aspirin   81 mg Oral Daily    cetirizine   10 mg Oral Daily    dapsone   100 mg Oral Daily    enoxaparin  (LOVENOX ) injection  40 mg Subcutaneous Q24H [facility]    escitalopram  oxalate  10 mg Oral Daily    insulin  lispro  0-6 Units Subcutaneous ACHS    [Provider Hold] insulin  lispro  8 Units Subcutaneous 3xd Meals    insulin  NPH  15 Units Subcutaneous Q12H [facility]    letermovir   480 mg Oral Daily    [START ON 12/09/2023] losartan   25 mg Oral Daily    oxyCODONE   5 mg Oral Once    polyethylene glycol  17 g Oral BID    PONATinib   30 mg Oral Daily    psyllium  1 packet Oral Daily    valACYclovir   500 mg Oral Daily     Continuous Infusions:   Chemo Clarification Order      Chemo Clarification Order      Chemo Clarification Order      Chemo Clarification Order      Chemo Clarification Order      Chemo Clarification Order      IP okay to treat      IP okay to treat      IP okay to treat      IP okay to treat       PRN Meds:.albuterol , bismuth  subsalicylate, Chemo Clarification Order, Chemo Clarification Order, Chemo Clarification Order, Chemo Clarification Order, Chemo Clarification Order, Chemo Clarification Order, cyclobenzaprine , dextrose  in water, diclofenac  sodium, diphenhydrAMINE , glucagon, glucose, IP okay to treat, IP okay to treat, IP okay to treat, IP okay to treat, labetalol , lidocaine , metoclopramide , prochlorperazine  **OR** prochlorperazine 

## 2023-12-08 NOTE — Unmapped (Deleted)
 Problem: Adult Inpatient Plan of Care  Goal: Plan of Care Review  Outcome: Ongoing - Unchanged  Goal: Patient-Specific Goal (Individualized)  Outcome: Ongoing - Unchanged  Goal: Absence of Hospital-Acquired Illness or Injury  Outcome: Ongoing - Unchanged  Intervention: Prevent Skin Injury  Recent Flowsheet Documentation  Taken 12/07/2023 2000 by Albertha Huger, RN  Positioning for Skin: Supine/Back  Device Skin Pressure Protection: adhesive use limited  Skin Protection: adhesive use limited  Intervention: Prevent Infection  Recent Flowsheet Documentation  Taken 12/07/2023 2000 by Albertha Huger, RN  Infection Prevention: cohorting utilized  Goal: Optimal Comfort and Wellbeing  Outcome: Ongoing - Unchanged  Goal: Readiness for Transition of Care  Outcome: Ongoing - Unchanged  Goal: Rounds/Family Conference  Outcome: Ongoing - Unchanged     Problem: Fall Injury Risk  Goal: Absence of Fall and Fall-Related Injury  Outcome: Ongoing - Unchanged     Problem: Comorbidity Management  Goal: Maintenance of Asthma Control  Outcome: Ongoing - Unchanged  Goal: Maintenance of Behavioral Health Symptom Control  Outcome: Ongoing - Unchanged  Goal: Maintenance of COPD Symptom Control  Outcome: Ongoing - Unchanged  Goal: Blood Glucose Levels Within Targeted Range  Outcome: Ongoing - Unchanged  Goal: Maintenance of Heart Failure Symptom Control  Outcome: Ongoing - Unchanged  Goal: Blood Pressure in Desired Range  Outcome: Ongoing - Unchanged  Goal: Maintenance of Osteoarthritis Symptom Control  Outcome: Ongoing - Unchanged  Intervention: Maintain Osteoarthritis Symptom Control  Recent Flowsheet Documentation  Taken 12/07/2023 2000 by Albertha Huger, RN  Activity Management: up ad lib  Goal: Bariatric Home Regimen Maintained  Outcome: Ongoing - Unchanged  Goal: Maintenance of Seizure Control  Outcome: Ongoing - Unchanged     Problem: Pain Acute  Goal: Optimal Pain Control and Function  Outcome: Ongoing - Unchanged

## 2023-12-08 NOTE — Unmapped (Signed)
 Daily Progress Note      Principal Problem:    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)    Active Problems:    Blurred vision, bilateral    Hypertension    Headache    Metastasis to brain      Drug-induced injury of liver    Type 2 diabetes mellitus, with long-term current use of insulin       Hyperlipidemia       LOS: 14 days     Assessment/Plan:Patricia [name] is an 48 y.o. 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 Started on IT methotrexate  and dexamethasone .     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 Started on biweekly IT methotrexate . MDR positive  Mon/Thurs schedule for LP with IT MTX.   Last LP 5/8 with 7 nucleated cells and blast present  Will continue twice weekly LPs with IT chemo until 0 nucleated cells and no blasts present in the CSF  Lovenox  held. Will restart 5/9-10.  Headache, improved: dexamethasone  STOPPED 5/5  Ponatanib restarted 5/1 due to disease progression  Monitor LFTs daily, improving  Continue ASA 81mg  daily for arterial clot ppx  Continue enoxaparin  40mg  subQ for DVT ppx (hold day of LP)  Prophylaxis:valtrex , Prevymis   MDR positive. Will plan to start blinatumomab  if CSF cleared on LP.     Intermittent Tachycardia  Starting 5/4 patient having intermittent episodes of tachycardia (130-160 bpm). The episodes were found during vital checks and on telemetry. EKG x2 showed normal sinus rhythm with normal rate. Electrolytes are stable. Patient asymptomatic during episodes. Evaluated by cardiology with low concern for afib. Advised to avoid anticoagulation iso weekly lumbar punctures. Echocardiogram stable.   - Telemetry stopped.   - K+ >4 Mag >2    Hypertension  History of chronic hypertension. Uptitrated to home dosage of Losartan  50 mg 5/4.   Continue PO Losartan  50 mg (home dose)  Continue Labetalol  PRN SBP>180 or DBP>120     Acute Liver Injury - C/f Tylenol  Toxicity, improving   On admission found to have acutely elevated  ALT and AST. Liver doppler 4/26 without signs of veno-occlusive leukemic infiltration or portal vein thrombosis. Acute hepatitis panel negative. HIV negative. Trending labs with daily improvement.   Trend hepatic function panel   Trend PT-INR, CBC/Diff, CMP/Mg/Phos  Monitor LFTs after ponatanib resumption     T2DM  Suspected elevation in BG during course of steroids. Will CTM with steriod taper.   NPH BID - titrating w/ gluc above goal   Increased SSI to SF 30    Neck pain  CT of Cervical spine at OSH hospital showed degenerative disc disease at C5-C6 with no spinal cord compression. Patient states she has increased pain in her neck from lying in bed all day. It improves with movement and muscle relaxers. No numbness or tingling. Improves with voltaren  gel and cold packs. Will continue to monitor symptoms   - Voltaren  gel prn   - Ice Packs   - PRN Flexeril        Chronic Problems  HLD: HOLD rosuvastatin  10 mg nightly     Impending Electrolyte Abnormality Secondary to Chemotherapy and/or IV Fluids  -Daily Electrolyte monitoring  -Replete per [facility] guidelines.  Immunocompromised status: Patient is immunocompromised secondary to disease and chemotherapy  -Antimicrobial prophylaxis as above    Impending Pancytopenia secondary to Acute Leukemia and chemotherapy:   - Transfuse hgb <7  - Transfuse plt <10K    Nutrition: Diabetic                        Subjective:     Interval History:  [name].     10 point ROS otherwise negative except as above in the HPI.     Objective:     Vital signs in last 24 hours:  Temp:  [36.5 ??C (97.7 ??F)-36.9 ??C (98.4 ??F)] 36.6 ??C (97.9 ??F)  Pulse:  [84-113] 102  Resp:  [16] 16  BP: (96-130)/(61-100) 118/97  MAP (mmHg):  [72-108] 103  SpO2:  [94 %-97 %] 96 %    Intake/Output last 3 shifts:  No intake/output data recorded.    Meds:  Current Facility-Administered Medications   Medication Dose Route Frequency Provider Last Rate Last Admin    albuterol  (PROVENTIL  HFA;VENTOLIN  HFA) 90 mcg/actuation inhaler 2 puff  2 puff Inhalation Q6H PRN Daphene Dys, MD        aspirin  chewable tablet 81 mg  81 mg Oral Daily Kitay, Ilona, MD   81 mg at 12/01/23 1048    bismuth  subsalicylate (PEPTO-BISMOL) oral suspension  30 mL Oral Q6H PRN Rudloff, Michael W, MD   30 mL at 11/25/23 1648    cetirizine  (ZYRTEC ) tablet 10 mg  10 mg Oral Daily Rudloff, Michael W, MD   10 mg at 12/01/23 1610    CHEMO CLARIFICATION ORDER   Other Continuous PRN Stacey Dyke, MD        CHEMO CLARIFICATION ORDER   Other Continuous PRN Stacey Dyke, MD        cyclobenzaprine  (FLEXERIL ) tablet 10 mg  10 mg Oral TID PRN Lindsay Rho, Ellet C, DO   10 mg at 11/30/23 2019    dapsone  tablet 100 mg  100 mg Oral Daily Daphene Dys, MD   100 mg at 12/01/23 9604    dexAMETHasone  (DECADRON ) tablet 4 mg  4 mg Oral Q12H [facility] Kitay, Ilona, MD        dextrose  50 % in water (D50W) 50 % solution 12.5 g  12.5 g Intravenous Q15 Min PRN Cookmeyer, Helga Loan, MD        diclofenac  sodium (VOLTAREN ) 1 % gel 4 g  4 g Topical QID Loletha Ripper, MD   4 g at 11/29/23 1133    diphenhydrAMINE  (BENADRYL ) injection  25 mg Intravenous Q6H PRN Cornett, Fletcher Humble, MD   25 mg at 11/30/23 0858    enoxaparin  (LOVENOX ) syringe 40 mg  40 mg Subcutaneous Q24H [facility] Kitay, Ilona, MD   40 mg at 12/01/23 1048    escitalopram  oxalate (LEXAPRO ) tablet 10 mg  10 mg Oral Daily Daphene Dys, MD   10 mg at 12/01/23 5409    glucagon injection 1 mg  1 mg Intramuscular Once PRN Cookmeyer, Helga Loan, MD        glucose chewable tablet 16 g  16 g Oral Q10 Min PRN Cookmeyer, Helga Loan, MD        insulin  lispro (HumaLOG ) injection 0-6 Units  0-6 Units Subcutaneous ACHS Loletha Ripper, MD 1 Units at 12/01/23 8119    insulin  lispro (HumaLOG ) injection 7 Units  7 Units Subcutaneous 3xd Meals Lichtman, Ezra S, MD   7 Units  at 12/01/23 0830    insulin  NPH (HumuLIN ,NovoLIN ) injection 17 Units  17 Units Subcutaneous Q12H [facility] Arvil Lauber, MD   17 Units at 12/01/23 0831    IP OKAY TO TREAT   Other Continuous PRN Stacey Dyke, MD        IP OKAY TO TREAT   Other Continuous PRN Stacey Dyke, MD        labetalol  (NORMODYNE ) injection  10 mg Intravenous Q6H PRN Teressa Ferdinand, MD   10 mg at 11/29/23 1640    letermovir  (PREVYMIS ) tablet 480 mg  480 mg Oral Daily Daphene Dys, MD   480 mg at 11/30/23 2021    lidocaine  (ASPERCREME) 4 % 1 patch  1 patch Transdermal Daily Zewdu, Winthrop Hawks, MD        losartan  (COZAAR ) tablet 25 mg  25 mg Oral Daily Loletha Ripper, MD   25 mg at 12/01/23 0833    metoclopramide  (REGLAN ) injection 10 mg  10 mg Intravenous Q6H PRN Cornett, Fletcher Humble, MD   10 mg at 11/30/23 9562    midazolam  (VERSED ) injection 2 mg  2 mg Intravenous Once PRN Beverlyn Buckles, MD        oxyCODONE  (ROXICODONE ) immediate release tablet 5 mg  5 mg Oral Q4H PRN Cookmeyer, Helga Loan, MD        senna (SENOKOT) tablet 2 tablet  2 tablet Oral Nightly PRN Cornett, Fletcher Humble, MD        Or    polyethylene glycol (MIRALAX ) packet 17 g  17 g Oral Daily PRN Cornett, Fletcher Humble, MD        PONATinib  (ICLUSIG ) tablet 30 mg PATIENT SUPPLIED  30 mg Oral Daily Stacey Dyke, MD   30 mg at 11/30/23 2129    prochlorperazine  (COMPAZINE ) tablet 10 mg  10 mg Oral Q6H PRN Sammuel Crimes, DO        Or    prochlorperazine  (COMPAZINE ) injection 10 mg  10 mg Intravenous Q6H PRN Sammuel Crimes, DO   10 mg at 11/27/23 1847    valACYclovir  (VALTREX ) tablet 500 mg  500 mg Oral Daily Teressa Ferdinand, MD   500 mg at 11/30/23 2021       Physical Exam  General: well appearing, NAD  HEENT: no lymphadenopathy or frank JVD  CV: RRR, no murmurs  Pulm: No incr WOB/conversational dyspnea, clear lungs   Abdo: NDNT, soft  MSK: extremities warm to touch, no edema BLE        Labs:  Recent Labs     12/06/23  0145 12/07/23  0711 12/08/23  0412   WBC 8.3 8.4 8.7   NEUTROABS 6.0 6.6 6.9   LYMPHSABS 1.7 1.2 1.2   HGB 11.8 11.0* 11.1*   HCT 33.1* 30.6* 32.7*   PLT 146* 123* 131*   CREATININE 0.62 0.49* 0.46*   BUN 29* 15 18   BILITOT 0.7 0.6 0.8   AST 28 37* 33   ALT 91* 83* 97*   ALKPHOS 102 89 93   K 4.2 4.4 4.0   MG 1.9 1.9 2.1   CALCIUM  9.6 9.3 9.7   NA 138 134* 139   CL 104 102 104   CO2 17.0* 24.0 28.0   PHOS 4.1 3.3 2.7   INR 0.97 0.95 0.87       Imaging:  No new.

## 2023-12-09 LAB — CBC W/ AUTO DIFF
BASOPHILS ABSOLUTE COUNT: 0 10*9/L (ref 0.0–0.1)
BASOPHILS RELATIVE PERCENT: 0.4 %
EOSINOPHILS ABSOLUTE COUNT: 0.2 10*9/L (ref 0.0–0.5)
EOSINOPHILS RELATIVE PERCENT: 2.5 %
HEMATOCRIT: 28.7 % — ABNORMAL LOW (ref 34.0–44.0)
HEMOGLOBIN: 10.2 g/dL — ABNORMAL LOW (ref 11.3–14.9)
LYMPHOCYTES ABSOLUTE COUNT: 1.2 10*9/L (ref 1.1–3.6)
LYMPHOCYTES RELATIVE PERCENT: 18.2 %
MEAN CORPUSCULAR HEMOGLOBIN CONC: 35.7 g/dL (ref 32.0–36.0)
MEAN CORPUSCULAR HEMOGLOBIN: 35 pg — ABNORMAL HIGH (ref 25.9–32.4)
MEAN CORPUSCULAR VOLUME: 98.3 fL — ABNORMAL HIGH (ref 77.6–95.7)
MEAN PLATELET VOLUME: 8.7 fL (ref 6.8–10.7)
MONOCYTES ABSOLUTE COUNT: 0.4 10*9/L (ref 0.3–0.8)
MONOCYTES RELATIVE PERCENT: 6.6 %
NEUTROPHILS ABSOLUTE COUNT: 4.6 10*9/L (ref 1.8–7.8)
NEUTROPHILS RELATIVE PERCENT: 72.3 %
PLATELET COUNT: 121 10*9/L — ABNORMAL LOW (ref 150–450)
RED BLOOD CELL COUNT: 2.92 10*12/L — ABNORMAL LOW (ref 3.95–5.13)
RED CELL DISTRIBUTION WIDTH: 17.4 % — ABNORMAL HIGH (ref 12.2–15.2)
WBC ADJUSTED: 6.4 10*9/L (ref 3.6–11.2)

## 2023-12-09 LAB — COMPREHENSIVE METABOLIC PANEL
ALBUMIN: 3.5 g/dL (ref 3.4–5.0)
ALKALINE PHOSPHATASE: 90 U/L (ref 46–116)
ALT (SGPT): 95 U/L — ABNORMAL HIGH (ref 10–49)
ANION GAP: 10 mmol/L (ref 5–14)
BILIRUBIN TOTAL: 0.5 mg/dL (ref 0.3–1.2)
BLOOD UREA NITROGEN: 18 mg/dL (ref 9–23)
BUN / CREAT RATIO: 35
CALCIUM: 9.4 mg/dL (ref 8.7–10.4)
CHLORIDE: 104 mmol/L (ref 98–107)
CO2: 25 mmol/L (ref 20.0–31.0)
CREATININE: 0.51 mg/dL — ABNORMAL LOW (ref 0.55–1.02)
EGFR CKD-EPI (2021) FEMALE: >90 mL/min/1.73m2 (ref >=60)
GLUCOSE RANDOM: 216 mg/dL — ABNORMAL HIGH (ref 70–179)
PROTEIN TOTAL: 5.8 g/dL (ref 5.7–8.2)
SODIUM: 139 mmol/L (ref 135–145)

## 2023-12-09 LAB — MAGNESIUM: MAGNESIUM: 1.9 mg/dL (ref 1.6–2.6)

## 2023-12-09 LAB — PROTIME-INR
INR: 0.89
PROTIME: 10.1 s (ref 9.9–12.6)

## 2023-12-09 LAB — POTASSIUM: POTASSIUM: 3.9 mmol/L (ref 3.4–4.8)

## 2023-12-09 LAB — AST: AST (SGOT): 43 U/L — ABNORMAL HIGH (ref <=34)

## 2023-12-09 LAB — PHOSPHORUS: PHOSPHORUS: 3.6 mg/dL (ref 2.4–5.1)

## 2023-12-09 MED ADMIN — escitalopram oxalate (LEXAPRO) tablet 10 mg: 10 mg | ORAL | @ 12:00:00 | Stop: 2023-12-25

## 2023-12-09 MED ADMIN — insulin lispro (HumaLOG) injection 0-6 Units: 0-6 [IU] | SUBCUTANEOUS | @ 18:00:00

## 2023-12-09 MED ADMIN — insulin lispro (HumaLOG) injection 0-6 Units: 0-6 [IU] | SUBCUTANEOUS | @ 01:00:00

## 2023-12-09 MED ADMIN — insulin lispro (HumaLOG) injection 0-6 Units: 0-6 [IU] | SUBCUTANEOUS | @ 22:00:00

## 2023-12-09 MED ADMIN — insulin NPH (HumuLIN,NovoLIN) injection 15 Units: 15 [IU] | SUBCUTANEOUS | @ 12:00:00 | Stop: 2023-12-09

## 2023-12-09 MED ADMIN — cetirizine (ZYRTEC) tablet 10 mg: 10 mg | ORAL | @ 12:00:00

## 2023-12-09 MED ADMIN — losartan (COZAAR) tablet 25 mg: 25 mg | ORAL | @ 12:00:00

## 2023-12-09 MED ADMIN — insulin NPH (HumuLIN,NovoLIN) injection 15 Units: 15 [IU] | SUBCUTANEOUS | @ 01:00:00

## 2023-12-09 MED ADMIN — letermovir (PREVYMIS) tablet 480 mg: 480 mg | ORAL | @ 01:00:00 | Stop: 2023-12-25

## 2023-12-09 MED ADMIN — PONATinib (ICLUSIG) tablet 30 mg ***PATIENT SUPPLIED***: 30 mg | ORAL | @ 02:00:00

## 2023-12-09 MED ADMIN — aspirin chewable tablet 81 mg: 81 mg | ORAL | @ 12:00:00

## 2023-12-09 MED ADMIN — insulin lispro (HumaLOG) injection 0-6 Units: 0-6 [IU] | SUBCUTANEOUS | @ 12:00:00

## 2023-12-09 MED ADMIN — enoxaparin (LOVENOX) syringe 40 mg: 40 mg | SUBCUTANEOUS | @ 12:00:00 | Stop: 2023-12-09

## 2023-12-09 MED ADMIN — valACYclovir (VALTREX) tablet 500 mg: 500 mg | ORAL | @ 01:00:00 | Stop: 2023-12-24

## 2023-12-09 MED ADMIN — dapsone tablet 100 mg: 100 mg | ORAL | @ 12:00:00 | Stop: 2023-12-25

## 2023-12-09 NOTE — Unmapped (Signed)
 Daily Progress Note      Principal Problem:    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)    Active Problems:    Blurred vision, bilateral    Hypertension    Headache    Metastasis to brain      Drug-induced injury of liver    Type 2 diabetes mellitus, with long-term current use of insulin       Hyperlipidemia       LOS: 15 days     Assessment/Plan:Patricia [name] is an 48 y.o. 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 Started on IT methotrexate  and dexamethasone .     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 Started on biweekly IT methotrexate . MDR positive  Mon/Thurs schedule for LP with IT MTX.   Last LP 5/8 with 7 nucleated cells and blast present  Will continue twice weekly LPs with IT chemo until 0 nucleated cells and no blasts present in the CSF. Next scheduled for 5/12.   Lovenox  restarted. Hold on day of LP  Headache, improved: dexamethasone  STOPPED 5/5  Ponatanib restarted 5/1 due to disease progression  Monitor LFTs daily, improving  Continue ASA 81mg  daily for arterial clot ppx  Continue enoxaparin  40mg  subQ for DVT ppx (hold day of LP)  Prophylaxis:valtrex , Prevymis   MDR positive. Will plan to start blinatumomab  if CSF cleared on LP.       Hypertension  History of chronic hypertension. Uptitrated to home dosage of Losartan  50 mg 5/4.   Continue PO Losartan  50 mg (home dose)  Continue Labetalol  PRN SBP>180 or DBP>120     Acute Liver Injury - C/f Tylenol  Toxicity, improving   On admission found to have acutely elevated  ALT and AST. Liver doppler 4/26 without signs of veno-occlusive leukemic infiltration or portal vein thrombosis. Acute hepatitis panel negative. HIV negative. Trending labs with daily improvement.   Trend hepatic function panel   Trend PT-INR, CBC/Diff, CMP/Mg/Phos  Monitor LFTs after ponatanib resumption     T2DM  Suspected elevation in BG during course of steroids. Will CTM with steriod taper.   NPH BID - titrating w/ gluc above goal   Increased SSI to SF 30    Neck pain  CT of Cervical spine at OSH hospital showed degenerative disc disease at C5-C6 with no spinal cord compression. Patient states she has increased pain in her neck from lying in bed all day. It improves with movement and muscle relaxers. No numbness or tingling. Improves with voltaren  gel and cold packs. Will continue to monitor symptoms   - Voltaren  gel prn   - Ice Packs   - PRN Flexeril        Chronic Problems  HLD: HOLD rosuvastatin  10 mg nightly     Impending Electrolyte Abnormality Secondary to Chemotherapy and/or IV Fluids  -Daily Electrolyte monitoring  -Replete per [facility] guidelines.     Immunocompromised status: Patient is immunocompromised secondary to disease and chemotherapy  -Antimicrobial prophylaxis as above    Impending Pancytopenia secondary to Acute Leukemia and chemotherapy:   - Transfuse hgb <7  - Transfuse plt <10K    Nutrition: Diabetic  Subjective:     Interval History:  [name]. Feeling well this morning.     10 point ROS otherwise negative except as above in the HPI.     Objective:     Vital signs in last 24 hours:  Temp:  [36.6 ??C (97.9 ??F)-37 ??C (98.6 ??F)] 36.8 ??C (98.2 ??F)  Pulse:  [96-102] 100  Resp:  [16] 16  BP: (113-131)/(74-97) 131/96  MAP (mmHg):  [86-107] 107  SpO2:  [95 %-97 %] 96 %    Intake/Output last 3 shifts:  No intake/output data recorded.    Meds:  Current Facility-Administered Medications   Medication Dose Route Frequency Provider Last Rate Last Admin    albuterol  (PROVENTIL  HFA;VENTOLIN  HFA) 90 mcg/actuation inhaler 2 puff  2 puff Inhalation Q6H PRN Daphene Dys, MD        aspirin  chewable tablet 81 mg  81 mg Oral Daily Kitay, Ilona, MD 81 mg at 12/01/23 1048    bismuth  subsalicylate (PEPTO-BISMOL) oral suspension  30 mL Oral Q6H PRN Rudloff, Michael W, MD   30 mL at 11/25/23 1648    cetirizine  (ZYRTEC ) tablet 10 mg  10 mg Oral Daily Rudloff, Michael W, MD   10 mg at 12/01/23 9562    CHEMO CLARIFICATION ORDER   Other Continuous PRN Stacey Dyke, MD        CHEMO CLARIFICATION ORDER   Other Continuous PRN Stacey Dyke, MD        cyclobenzaprine  (FLEXERIL ) tablet 10 mg  10 mg Oral TID PRN Lindsay Rho, Jakelin Taussig C, DO   10 mg at 11/30/23 2019    dapsone  tablet 100 mg  100 mg Oral Daily Daphene Dys, MD   100 mg at 12/01/23 1308    dexAMETHasone  (DECADRON ) tablet 4 mg  4 mg Oral Q12H [facility] Kitay, Ilona, MD        dextrose  50 % in water (D50W) 50 % solution 12.5 g  12.5 g Intravenous Q15 Min PRN Cookmeyer, Helga Loan, MD        diclofenac  sodium (VOLTAREN ) 1 % gel 4 g  4 g Topical QID Loletha Ripper, MD   4 g at 11/29/23 1133    diphenhydrAMINE  (BENADRYL ) injection  25 mg Intravenous Q6H PRN Cornett, Fletcher Humble, MD   25 mg at 11/30/23 0858    enoxaparin  (LOVENOX ) syringe 40 mg  40 mg Subcutaneous Q24H [facility] Kitay, Ilona, MD   40 mg at 12/01/23 1048    escitalopram  oxalate (LEXAPRO ) tablet 10 mg  10 mg Oral Daily Daphene Dys, MD   10 mg at 12/01/23 6578    glucagon injection 1 mg  1 mg Intramuscular Once PRN Cookmeyer, Helga Loan, MD        glucose chewable tablet 16 g  16 g Oral Q10 Min PRN Cookmeyer, Helga Loan, MD        insulin  lispro (HumaLOG ) injection 0-6 Units  0-6 Units Subcutaneous ACHS Loletha Ripper, MD   1 Units at 12/01/23 4696    insulin  lispro (HumaLOG ) injection 7 Units  7 Units Subcutaneous 3xd Meals Lichtman, Ezra S, MD   7 Units at 12/01/23 0830    insulin  NPH (HumuLIN ,NovoLIN ) injection 17 Units  17 Units Subcutaneous Q12H [facility] Lichtman, Ezra S, MD   17 Units at 12/01/23 0831    IP OKAY TO TREAT   Other Continuous PRN Stacey Dyke, MD        IP OKAY TO TREAT   Other Continuous PRN  Stacey Dyke, MD labetalol  (NORMODYNE ) injection  10 mg Intravenous Q6H PRN Teressa Ferdinand, MD   10 mg at 11/29/23 1640    letermovir  (PREVYMIS ) tablet 480 mg  480 mg Oral Daily Daphene Dys, MD   480 mg at 11/30/23 2021    lidocaine  (ASPERCREME) 4 % 1 patch  1 patch Transdermal Daily Zewdu, Winthrop Hawks, MD        losartan  (COZAAR ) tablet 25 mg  25 mg Oral Daily Loletha Ripper, MD   25 mg at 12/01/23 0833    metoclopramide  (REGLAN ) injection 10 mg  10 mg Intravenous Q6H PRN Cornett, Fletcher Humble, MD   10 mg at 11/30/23 6045    midazolam  (VERSED ) injection 2 mg  2 mg Intravenous Once PRN Beverlyn Buckles, MD        oxyCODONE  (ROXICODONE ) immediate release tablet 5 mg  5 mg Oral Q4H PRN Cookmeyer, Helga Loan, MD        senna (SENOKOT) tablet 2 tablet  2 tablet Oral Nightly PRN Cornett, Fletcher Humble, MD        Or    polyethylene glycol (MIRALAX ) packet 17 g  17 g Oral Daily PRN Cornett, Fletcher Humble, MD        PONATinib  (ICLUSIG ) tablet 30 mg PATIENT SUPPLIED  30 mg Oral Daily Stacey Dyke, MD   30 mg at 11/30/23 2129    prochlorperazine  (COMPAZINE ) tablet 10 mg  10 mg Oral Q6H PRN Sammuel Crimes, DO        Or    prochlorperazine  (COMPAZINE ) injection 10 mg  10 mg Intravenous Q6H PRN Sammuel Crimes, DO   10 mg at 11/27/23 1847    valACYclovir  (VALTREX ) tablet 500 mg  500 mg Oral Daily Teressa Ferdinand, MD   500 mg at 11/30/23 2021       Physical Exam  General: well appearing, NAD  HEENT: no lymphadenopathy or frank JVD  CV: RRR, no murmurs  Pulm: No incr WOB/conversational dyspnea, clear lungs   Abdo: NDNT, soft  MSK: extremities warm to touch, no edema BLE        Labs:  Recent Labs     12/07/23  0711 12/08/23  0412 12/09/23  0520 12/09/23  0615   WBC 8.4 8.7 6.4  --    NEUTROABS 6.6 6.9 4.6  --    LYMPHSABS 1.2 1.2 1.2  --    HGB 11.0* 11.1* 10.2*  --    HCT 30.6* 32.7* 28.7*  --    PLT 123* 131* 121*  --    CREATININE 0.49* 0.46* 0.51*  --    BUN 15 18 18   --    BILITOT 0.6 0.8 0.5  --    AST 37* 33  --  43*   ALT 83* 97* 95* --    ALKPHOS 89 93 90  --    K 4.4 4.0  --  3.9   MG 1.9 2.1 1.9  --    CALCIUM  9.3 9.7 9.4  --    NA 134* 139 139  --    CL 102 104 104  --    CO2 24.0 28.0 25.0  --    PHOS 3.3 2.7 3.6  --    INR 0.95 0.87 0.89  --        Imaging:  No new.

## 2023-12-09 NOTE — Unmapped (Signed)
 Patient Patricia [name], VSS, afebrile. Patient did not receive any PRN medications this shift. No falls or acute events this shift. Patient now has order for off unit pass.  Plan of Care reviewed with patient.  Safety measures in place of bed low with brakes locked, non-skid footwear on while out of bed, call bell within reach.        Problem: Adult Inpatient Plan of Care  Goal: Plan of Care Review  Outcome: Ongoing - Unchanged  Goal: Patient-Specific Goal (Individualized)  Outcome: Ongoing - Unchanged  Goal: Absence of Hospital-Acquired Illness or Injury  Outcome: Ongoing - Unchanged  Intervention: Identify and Manage Fall Risk  Recent Flowsheet Documentation  Taken 12/09/2023 0820 by Mylo Asai, RN  Safety Interventions:   bleeding precautions   environmental modification   fall reduction program maintained   infection management   isolation precautions   lighting adjusted for tasks/safety   low bed   neutropenic precautions   nonskid shoes/slippers when out of bed  Taken 12/09/2023 0714 by Mylo Asai, RN  Safety Interventions:   bleeding precautions   chemotherapeutic agent precautions   environmental modification   fall reduction program maintained   infection management   isolation precautions   lighting adjusted for tasks/safety   low bed   neutropenic precautions   nonskid shoes/slippers when out of bed  Intervention: Prevent Skin Injury  Recent Flowsheet Documentation  Taken 12/09/2023 0820 by Mylo Asai, RN  Positioning for Skin: Left  Taken 12/09/2023 8469 by Mylo Asai, RN  Positioning for Skin: Left  Intervention: Prevent Infection  Recent Flowsheet Documentation  Taken 12/09/2023 0820 by Mylo Asai, RN  Infection Prevention: cohorting utilized  Taken 12/09/2023 0714 by Mylo Asai, RN  Infection Prevention: cohorting utilized  Goal: Optimal Comfort and Wellbeing  Outcome: Ongoing - Unchanged  Goal: Readiness for Transition of Care  Outcome: Ongoing - Unchanged  Goal: Rounds/Family Conference  Outcome: Ongoing - Unchanged     Problem: Fall Injury Risk  Goal: Absence of Fall and Fall-Related Injury  Outcome: Ongoing - Unchanged  Intervention: Promote Injury-Free Environment  Recent Flowsheet Documentation  Taken 12/09/2023 0820 by Mylo Asai, RN  Safety Interventions:   bleeding precautions   environmental modification   fall reduction program maintained   infection management   isolation precautions   lighting adjusted for tasks/safety   low bed   neutropenic precautions   nonskid shoes/slippers when out of bed  Taken 12/09/2023 0714 by Mylo Asai, RN  Safety Interventions:   bleeding precautions   chemotherapeutic agent precautions   environmental modification   fall reduction program maintained   infection management   isolation precautions   lighting adjusted for tasks/safety   low bed   neutropenic precautions   nonskid shoes/slippers when out of bed     Problem: Comorbidity Management  Goal: Maintenance of Asthma Control  Outcome: Ongoing - Unchanged  Goal: Maintenance of Behavioral Health Symptom Control  Outcome: Ongoing - Unchanged  Goal: Maintenance of COPD Symptom Control  Outcome: Ongoing - Unchanged  Goal: Blood Glucose Levels Within Targeted Range  Outcome: Ongoing - Unchanged  Goal: Maintenance of Heart Failure Symptom Control  Outcome: Ongoing - Unchanged  Goal: Blood Pressure in Desired Range  Outcome: Ongoing - Unchanged  Goal: Maintenance of Osteoarthritis Symptom Control  Outcome: Ongoing - Unchanged  Intervention: Maintain Osteoarthritis Symptom Control  Recent Flowsheet Documentation  Taken 12/09/2023 0820 by Mylo Asai, RN  Activity Management: up  ad lib  Goal: Bariatric Home Regimen Maintained  Outcome: Ongoing - Unchanged  Goal: Maintenance of Seizure Control  Outcome: Ongoing - Unchanged     Problem: Pain Acute  Goal: Optimal Pain Control and Function  Outcome: Ongoing - Unchanged

## 2023-12-09 NOTE — Unmapped (Signed)
 Pt slept on & off thru the shift. No complains of pain. VSS. Promoted rest. Call bell within reach. \    Problem: Adult Inpatient Plan of Care  Goal: Plan of Care Review  Outcome: Progressing  Goal: Patient-Specific Goal (Individualized)  Outcome: Progressing  Goal: Absence of Hospital-Acquired Illness or Injury  Outcome: Progressing  Intervention: Identify and Manage Fall Risk  Recent Flowsheet Documentation  Taken 12/08/2023 2100 by Lisa Rideau, RN  Safety Interventions:   low bed   lighting adjusted for tasks/safety  Goal: Optimal Comfort and Wellbeing  Outcome: Progressing  Goal: Readiness for Transition of Care  Outcome: Progressing  Goal: Rounds/Family Conference  Outcome: Progressing     Problem: Fall Injury Risk  Goal: Absence of Fall and Fall-Related Injury  Outcome: Progressing  Intervention: Promote Scientist, clinical (histocompatibility and immunogenetics) Documentation  Taken 12/08/2023 2100 by Lisa Rideau, RN  Safety Interventions:   low bed   lighting adjusted for tasks/safety     Problem: Comorbidity Management  Goal: Maintenance of Asthma Control  Outcome: Progressing  Goal: Maintenance of Behavioral Health Symptom Control  Outcome: Progressing  Goal: Maintenance of COPD Symptom Control  Outcome: Progressing  Goal: Blood Glucose Levels Within Targeted Range  Outcome: Progressing  Goal: Maintenance of Heart Failure Symptom Control  Outcome: Progressing  Goal: Blood Pressure in Desired Range  Outcome: Progressing  Goal: Maintenance of Osteoarthritis Symptom Control  Outcome: Progressing  Goal: Bariatric Home Regimen Maintained  Outcome: Progressing  Goal: Maintenance of Seizure Control  Outcome: Progressing     Problem: Pain Acute  Goal: Optimal Pain Control and Function  Outcome: Progressing

## 2023-12-10 LAB — COMPREHENSIVE METABOLIC PANEL
ALBUMIN: 3.6 g/dL (ref 3.4–5.0)
ALKALINE PHOSPHATASE: 87 U/L (ref 46–116)
ALT (SGPT): 91 U/L — ABNORMAL HIGH (ref 10–49)
ANION GAP: 10 mmol/L (ref 5–14)
AST (SGOT): 34 U/L (ref <=34)
BILIRUBIN TOTAL: 0.7 mg/dL (ref 0.3–1.2)
BLOOD UREA NITROGEN: 14 mg/dL (ref 9–23)
BUN / CREAT RATIO: 27
CALCIUM: 9.2 mg/dL (ref 8.7–10.4)
CHLORIDE: 106 mmol/L (ref 98–107)
CO2: 24 mmol/L (ref 20.0–31.0)
CREATININE: 0.52 mg/dL — ABNORMAL LOW (ref 0.55–1.02)
EGFR CKD-EPI (2021) FEMALE: >90 mL/min/1.73m2 (ref >=60)
GLUCOSE RANDOM: 154 mg/dL (ref 70–179)
POTASSIUM: 4.1 mmol/L (ref 3.4–4.8)
PROTEIN TOTAL: 6.1 g/dL (ref 5.7–8.2)
SODIUM: 140 mmol/L (ref 135–145)

## 2023-12-10 LAB — PHOSPHORUS: PHOSPHORUS: 4.1 mg/dL (ref 2.4–5.1)

## 2023-12-10 LAB — CBC W/ AUTO DIFF
BASOPHILS ABSOLUTE COUNT: 0 10*9/L (ref 0.0–0.1)
BASOPHILS RELATIVE PERCENT: 0.4 %
EOSINOPHILS ABSOLUTE COUNT: 0.2 10*9/L (ref 0.0–0.5)
EOSINOPHILS RELATIVE PERCENT: 2.4 %
HEMATOCRIT: 29.7 % — ABNORMAL LOW (ref 34.0–44.0)
HEMOGLOBIN: 10.4 g/dL — ABNORMAL LOW (ref 11.3–14.9)
LYMPHOCYTES ABSOLUTE COUNT: 1.4 10*9/L (ref 1.1–3.6)
LYMPHOCYTES RELATIVE PERCENT: 19.6 %
MEAN CORPUSCULAR HEMOGLOBIN CONC: 35 g/dL (ref 32.0–36.0)
MEAN CORPUSCULAR HEMOGLOBIN: 34.6 pg — ABNORMAL HIGH (ref 25.9–32.4)
MEAN CORPUSCULAR VOLUME: 98.8 fL — ABNORMAL HIGH (ref 77.6–95.7)
MEAN PLATELET VOLUME: 8.5 fL (ref 6.8–10.7)
MONOCYTES ABSOLUTE COUNT: 0.5 10*9/L (ref 0.3–0.8)
MONOCYTES RELATIVE PERCENT: 7.8 %
NEUTROPHILS ABSOLUTE COUNT: 4.9 10*9/L (ref 1.8–7.8)
NEUTROPHILS RELATIVE PERCENT: 69.8 %
PLATELET COUNT: 131 10*9/L — ABNORMAL LOW (ref 150–450)
RED BLOOD CELL COUNT: 3.01 10*12/L — ABNORMAL LOW (ref 3.95–5.13)
RED CELL DISTRIBUTION WIDTH: 17.8 % — ABNORMAL HIGH (ref 12.2–15.2)
WBC ADJUSTED: 7 10*9/L (ref 3.6–11.2)

## 2023-12-10 LAB — PROTIME-INR
INR: 0.91
PROTIME: 10.4 s (ref 9.9–12.6)

## 2023-12-10 LAB — MAGNESIUM: MAGNESIUM: 1.9 mg/dL (ref 1.6–2.6)

## 2023-12-10 MED ADMIN — insulin lispro (HumaLOG) injection 0-6 Units: 0-6 [IU] | SUBCUTANEOUS | @ 01:00:00

## 2023-12-10 MED ADMIN — insulin lispro (HumaLOG) injection 0-6 Units: 0-6 [IU] | SUBCUTANEOUS | @ 21:00:00

## 2023-12-10 MED ADMIN — insulin lispro (HumaLOG) injection 3 Units: 3 [IU] | SUBCUTANEOUS | @ 21:00:00

## 2023-12-10 MED ADMIN — losartan (COZAAR) tablet 25 mg: 25 mg | ORAL | @ 13:00:00

## 2023-12-10 MED ADMIN — cetirizine (ZYRTEC) tablet 10 mg: 10 mg | ORAL | @ 13:00:00

## 2023-12-10 MED ADMIN — insulin lispro (HumaLOG) injection 0-6 Units: 0-6 [IU] | SUBCUTANEOUS | @ 17:00:00

## 2023-12-10 MED ADMIN — escitalopram oxalate (LEXAPRO) tablet 10 mg: 10 mg | ORAL | @ 13:00:00 | Stop: 2023-12-25

## 2023-12-10 MED ADMIN — aspirin chewable tablet 81 mg: 81 mg | ORAL | @ 13:00:00

## 2023-12-10 MED ADMIN — PONATinib (ICLUSIG) tablet 30 mg ***PATIENT SUPPLIED***: 30 mg | ORAL | @ 01:00:00

## 2023-12-10 MED ADMIN — insulin NPH (HumuLIN,NovoLIN) injection 18 Units: 18 [IU] | SUBCUTANEOUS | @ 13:00:00

## 2023-12-10 MED ADMIN — insulin NPH (HumuLIN,NovoLIN) injection 18 Units: 18 [IU] | SUBCUTANEOUS | @ 01:00:00

## 2023-12-10 MED ADMIN — valACYclovir (VALTREX) tablet 500 mg: 500 mg | ORAL | @ 01:00:00 | Stop: 2023-12-24

## 2023-12-10 MED ADMIN — dapsone tablet 100 mg: 100 mg | ORAL | @ 13:00:00 | Stop: 2023-12-25

## 2023-12-10 MED ADMIN — letermovir (PREVYMIS) tablet 480 mg: 480 mg | ORAL | @ 01:00:00 | Stop: 2023-12-25

## 2023-12-10 NOTE — Unmapped (Signed)
 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 Started on IT methotrexate .    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 Started on biweekly IT methotrexate . MDR positive  Mon/Thurs schedule for LP with IT MTX.   Last LP 5/8 with 7 nucleated cells and blast present  Will continue twice weekly LPs with IT chemo until 0 nucleated cells and no blasts present in the CSF. Next scheduled for 5/12.   Lovenox  restarted. Hold on day of LP  Headache, improved: dexamethasone  STOPPED 5/5  Ponatanib restarted 5/1 due to disease progression  Monitor LFTs daily, improving  Continue ASA 81mg  daily for arterial clot ppx  Continue enoxaparin  40mg  subQ for DVT ppx (hold day of LP)  Prophylaxis:valtrex , Prevymis   MDR positive. Will plan to start blinatumomab  if CSF cleared on LP.       Hypertension  History of chronic hypertension. Uptitrated to home dosage of Losartan  50 mg 5/4.   Continue PO Losartan  50 mg (home dose)  Continue Labetalol  PRN SBP>180 or DBP>120     T2DM  Suspected elevation in BG during course of steroids. Will CTM with steriod taper.   NPH 15 BID - titrating w/ gluc above goal   Increased SSI to SF 30      Chronic Problems  HLD: HOLD rosuvastatin  10 mg nightly     Impending Electrolyte Abnormality Secondary to Chemotherapy and/or IV Fluids  -Daily Electrolyte monitoring  -Replete per [facility] guidelines.     Immunocompromised status: Patient is immunocompromised secondary to disease and chemotherapy  -Antimicrobial prophylaxis as above    Impending Pancytopenia secondary to Acute Leukemia and chemotherapy:   - Transfuse hgb <7  - Transfuse plt <10K    Nutrition: Diabetic                        Subjective:     Interval History:  [name]. Ready to go home but understanding of plan.     10 point ROS otherwise negative except as above in the HPI.     Objective:     Vital signs in last 24 hours:  Temp:  [36.6 ??C (97.9 ??F)-37.1 ??C (98.7 ??F)] 36.6 ??C (97.9 ??F)  Pulse:  [87-112] 87  Resp:  [16-18] 16  BP: (113-134)/(81-96) 119/90  MAP (mmHg):  [92-106] 100  SpO2:  [94 %-96 %] 96 %    Intake/Output last 3 shifts:  I/O last 3 completed shifts:  In: 1355 [P.O.:1355]  Out: 4 [Urine:4]    Meds:  Current Facility-Administered Medications   Medication Dose Route Frequency Provider Last Rate Last Admin    albuterol  (PROVENTIL  HFA;VENTOLIN  HFA) 90 mcg/actuation inhaler 2 puff  2 puff Inhalation Q6H PRN Daphene Dys, MD        aspirin  chewable tablet 81 mg  81 mg Oral Daily Kitay, Ilona, MD   81 mg at 12/01/23 1048    bismuth  subsalicylate (PEPTO-BISMOL) oral suspension  30 mL Oral Q6H PRN Rudloff, Michael W, MD   30 mL at 11/25/23 1648    cetirizine  (ZYRTEC ) tablet 10 mg  10 mg Oral Daily Rudloff, Michael W, MD   10 mg at 12/01/23 1610    CHEMO CLARIFICATION ORDER   Other Continuous PRN Stacey Dyke, MD        CHEMO CLARIFICATION ORDER   Other Continuous PRN Stacey Dyke, MD        cyclobenzaprine  (FLEXERIL ) tablet 10 mg  10 mg Oral TID PRN Lindsay Rho, Evoleth Nordmeyer C, DO   10 mg at 11/30/23 2019    dapsone  tablet 100 mg  100 mg Oral Daily Daphene Dys, MD   100 mg at 12/01/23 9604    dexAMETHasone  (DECADRON ) tablet 4 mg  4 mg Oral Q12H SCH Kitay, Ilona, MD        dextrose  50 % in water (D50W) 50 % solution 12.5 g  12.5 g Intravenous Q15 Min PRN Cookmeyer, Helga Loan, MD        diclofenac  sodium (VOLTAREN ) 1 % gel 4 g  4 g Topical QID Loletha Ripper, MD   4 g at 11/29/23 1133    diphenhydrAMINE  (BENADRYL ) injection  25 mg Intravenous Q6H PRN Cornett, Fletcher Humble, MD   25 mg at 11/30/23 0858    enoxaparin  (LOVENOX ) syringe 40 mg  40 mg Subcutaneous Q24H [facility] Kitay, Ilona, MD   40 mg at 12/01/23 1048    escitalopram  oxalate (LEXAPRO ) tablet 10 mg  10 mg Oral Daily Daphene Dys, MD   10 mg at 12/01/23 5409    glucagon injection 1 mg  1 mg Intramuscular Once PRN Cookmeyer, Helga Loan, MD        glucose chewable tablet 16 g  16 g Oral Q10 Min PRN Cookmeyer, Helga Loan, MD        insulin  lispro (HumaLOG ) injection 0-6 Units  0-6 Units Subcutaneous ACHS Loletha Ripper, MD   1 Units at 12/01/23 8119    insulin  lispro (HumaLOG ) injection 7 Units  7 Units Subcutaneous 3xd Meals Lichtman, Ezra S, MD   7 Units at 12/01/23 0830    insulin  NPH (HumuLIN ,NovoLIN ) injection 17 Units  17 Units Subcutaneous Q12H [facility] Lichtman, Ezra S, MD   17 Units at 12/01/23 0831    IP OKAY TO TREAT   Other Continuous PRN Stacey Dyke, MD        IP OKAY TO TREAT   Other Continuous PRN Stacey Dyke, MD        labetalol  (NORMODYNE ) injection  10 mg Intravenous Q6H PRN Teressa Ferdinand, MD   10 mg at 11/29/23 1640    letermovir  (PREVYMIS ) tablet 480 mg  480 mg Oral Daily Daphene Dys, MD   480 mg at 11/30/23 2021    lidocaine  (ASPERCREME) 4 % 1 patch  1 patch Transdermal Daily Zewdu, Abeba, MD        losartan  (COZAAR ) tablet 25 mg  25 mg Oral Daily Rudloff, Michael W, MD   25 mg at 12/01/23 1478    metoclopramide  (REGLAN ) injection 10 mg  10 mg Intravenous Q6H PRN Cornett, Fletcher Humble, MD   10 mg at 11/30/23 [phone]  midazolam  (VERSED ) injection 2 mg  2 mg Intravenous Once PRN Beverlyn Buckles, MD        oxyCODONE  (ROXICODONE ) immediate release tablet 5 mg  5 mg Oral Q4H PRN Cookmeyer, Helga Loan, MD        senna (SENOKOT) tablet 2 tablet  2 tablet Oral Nightly PRN Cornett, Fletcher Humble, MD        Or    polyethylene glycol (MIRALAX ) packet 17 g  17 g Oral Daily PRN Cornett, Fletcher Humble, MD        PONATinib  (ICLUSIG ) tablet 30 mg PATIENT SUPPLIED  30 mg Oral Daily Stacey Dyke, MD   30 mg at 11/30/23 2129    prochlorperazine  (COMPAZINE ) tablet 10 mg  10 mg Oral Q6H PRN Sammuel Crimes, DO        Or    prochlorperazine  (COMPAZINE ) injection 10 mg  10 mg Intravenous Q6H PRN Sammuel Crimes, DO   10 mg at 11/27/23 1847    valACYclovir  (VALTREX ) tablet 500 mg  500 mg Oral Daily Teressa Ferdinand, MD   500 mg at 11/30/23 2021       Physical Exam  General: well appearing, NAD  HEENT: no lymphadenopathy or frank JVD  CV: RRR, no murmurs  Pulm: No incr WOB/conversational dyspnea, clear lungs   Abdo: NDNT, soft  MSK: extremities warm to touch, no edema BLE        Labs:  Recent Labs     12/08/23  0412 12/09/23  0520 12/09/23  0615 12/10/23  0357   WBC 8.7 6.4  --  7.0   NEUTROABS 6.9 4.6  --  4.9   LYMPHSABS 1.2 1.2  --  1.4   HGB 11.1* 10.2*  --  10.4*   HCT 32.7* 28.7*  --  29.7*   PLT 131* 121*  --  131*   CREATININE 0.46* 0.51*  --  0.52*   BUN 18 18  --  14   BILITOT 0.8 0.5  --  0.7   AST 33  --  43* 34   ALT 97* 95*  --  91*   ALKPHOS 93 90  --  87   K 4.0  --  3.9 4.1   MG 2.1 1.9  --  1.9   CALCIUM  9.7 9.4  --  9.2   NA 139 139  --  140   CL 104 104  --  106   CO2 28.0 25.0  --  24.0   PHOS 2.7 3.6  --  4.1   INR 0.87 0.89  --  0.91       Imaging:  No new.

## 2023-12-10 NOTE — Unmapped (Signed)
 Patient AOx4, VSS, afebrile. Patient did not receive any PRN medications this shift. No falls or acute events this shift. Patient off the unit for a while with husband.  Plan of Care reviewed with patient.  Safety measures in place of bed low with brakes locked, non-skid footwear on while out of bed, call bell within reach.        Problem: Adult Inpatient Plan of Care  Goal: Plan of Care Review  Outcome: Ongoing - Unchanged  Goal: Patient-Specific Goal (Individualized)  Outcome: Ongoing - Unchanged  Goal: Absence of Hospital-Acquired Illness or Injury  Outcome: Ongoing - Unchanged  Intervention: Identify and Manage Fall Risk  Recent Flowsheet Documentation  Taken 12/10/2023 0713 by Mylo Asai, RN  Safety Interventions:   bleeding precautions   chemotherapeutic agent precautions   environmental modification   fall reduction program maintained   infection management   isolation precautions   lighting adjusted for tasks/safety   low bed   neutropenic precautions   room near unit station  Intervention: Prevent Skin Injury  Recent Flowsheet Documentation  Taken 12/10/2023 0928 by Mylo Asai, RN  Positioning for Skin: Supine/Back  Taken 12/10/2023 0713 by Mylo Asai, RN  Positioning for Skin: Left  Intervention: Prevent Infection  Recent Flowsheet Documentation  Taken 12/10/2023 9629 by Mylo Asai, RN  Infection Prevention: cohorting utilized  Goal: Optimal Comfort and Wellbeing  Outcome: Ongoing - Unchanged  Goal: Readiness for Transition of Care  Outcome: Ongoing - Unchanged  Goal: Rounds/Family Conference  Outcome: Ongoing - Unchanged     Problem: Fall Injury Risk  Goal: Absence of Fall and Fall-Related Injury  Outcome: Ongoing - Unchanged  Intervention: Promote Injury-Free Environment  Recent Flowsheet Documentation  Taken 12/10/2023 5284 by Mylo Asai, RN  Safety Interventions:   bleeding precautions   chemotherapeutic agent precautions   environmental modification   fall reduction program maintained   infection management   isolation precautions   lighting adjusted for tasks/safety   low bed   neutropenic precautions   room near unit station     Problem: Comorbidity Management  Goal: Maintenance of Asthma Control  Outcome: Ongoing - Unchanged  Goal: Maintenance of Behavioral Health Symptom Control  Outcome: Ongoing - Unchanged  Goal: Maintenance of COPD Symptom Control  Outcome: Ongoing - Unchanged  Goal: Blood Glucose Levels Within Targeted Range  Outcome: Ongoing - Unchanged  Goal: Maintenance of Heart Failure Symptom Control  Outcome: Ongoing - Unchanged  Goal: Blood Pressure in Desired Range  Outcome: Ongoing - Unchanged  Goal: Maintenance of Osteoarthritis Symptom Control  Outcome: Ongoing - Unchanged  Intervention: Maintain Osteoarthritis Symptom Control  Recent Flowsheet Documentation  Taken 12/10/2023 0713 by Mylo Asai, RN  Activity Management: up ad lib  Goal: Bariatric Home Regimen Maintained  Outcome: Ongoing - Unchanged  Goal: Maintenance of Seizure Control  Outcome: Ongoing - Unchanged     Problem: Pain Acute  Goal: Optimal Pain Control and Function  Outcome: Ongoing - Unchanged

## 2023-12-10 NOTE — Unmapped (Signed)
 VSS and afebrile. Pt denies any pain, nausea or diarrhea. Insulin  given as ordered. Pt denies any other needs or concerns.     Problem: Adult Inpatient Plan of Care  Goal: Plan of Care Review  Outcome: Ongoing - Unchanged  Goal: Patient-Specific Goal (Individualized)  Outcome: Ongoing - Unchanged  Goal: Absence of Hospital-Acquired Illness or Injury  Outcome: Ongoing - Unchanged  Intervention: Identify and Manage Fall Risk  Recent Flowsheet Documentation  Taken 12/09/2023 2022 by Baily Serpe C, RN  Safety Interventions:   low bed   lighting adjusted for tasks/safety   nonskid shoes/slippers when out of bed   chemotherapeutic agent precautions  Goal: Optimal Comfort and Wellbeing  Outcome: Ongoing - Unchanged  Goal: Readiness for Transition of Care  Outcome: Ongoing - Unchanged  Goal: Rounds/Family Conference  Outcome: Ongoing - Unchanged     Problem: Fall Injury Risk  Goal: Absence of Fall and Fall-Related Injury  Outcome: Ongoing - Unchanged  Intervention: Promote Injury-Free Environment  Recent Flowsheet Documentation  Taken 12/09/2023 2022 by Tonga Prout C, RN  Safety Interventions:   low bed   lighting adjusted for tasks/safety   nonskid shoes/slippers when out of bed   chemotherapeutic agent precautions

## 2023-12-11 DIAGNOSIS — C9100 Acute lymphoblastic leukemia not having achieved remission: Principal | ICD-10-CM

## 2023-12-11 LAB — MAGNESIUM: MAGNESIUM: 1.9 mg/dL (ref 1.6–2.6)

## 2023-12-11 LAB — COMPREHENSIVE METABOLIC PANEL
ALBUMIN: 3.6 g/dL (ref 3.4–5.0)
ALKALINE PHOSPHATASE: 86 U/L (ref 46–116)
ALT (SGPT): 80 U/L — ABNORMAL HIGH (ref 10–49)
ANION GAP: 8 mmol/L (ref 5–14)
AST (SGOT): 32 U/L (ref <=34)
BILIRUBIN TOTAL: 0.8 mg/dL (ref 0.3–1.2)
BLOOD UREA NITROGEN: 17 mg/dL (ref 9–23)
BUN / CREAT RATIO: 29
CALCIUM: 9.4 mg/dL (ref 8.7–10.4)
CHLORIDE: 107 mmol/L (ref 98–107)
CO2: 25 mmol/L (ref 20.0–31.0)
CREATININE: 0.59 mg/dL (ref 0.55–1.02)
EGFR CKD-EPI (2021) FEMALE: >90 mL/min/1.73m2 (ref >=60)
GLUCOSE RANDOM: 146 mg/dL (ref 70–179)
POTASSIUM: 4 mmol/L (ref 3.4–4.8)
PROTEIN TOTAL: 6 g/dL (ref 5.7–8.2)
SODIUM: 140 mmol/L (ref 135–145)

## 2023-12-11 LAB — HEMATOPATHOLOGY LEUKEMIA/LYMPHOMA FLOW CYTOMETRY, CSF
LYMPHS CSF: 60 %
MONO/MACROPHAGE CSF: 22 %
NEUTROPHILS, CSF: 18 %
NUCLEATED CELLS, CSF: <3 ul (ref <=5)
NUMBER OF CELLS CSF: 100
RBC CSF: 214 ul — ABNORMAL HIGH (ref <2)

## 2023-12-11 LAB — CBC W/ AUTO DIFF
BASOPHILS ABSOLUTE COUNT: 0 10*9/L (ref 0.0–0.1)
BASOPHILS RELATIVE PERCENT: 0.4 %
EOSINOPHILS ABSOLUTE COUNT: 0.1 10*9/L (ref 0.0–0.5)
EOSINOPHILS RELATIVE PERCENT: 2.3 %
HEMATOCRIT: 27.8 % — ABNORMAL LOW (ref 34.0–44.0)
HEMOGLOBIN: 9.7 g/dL — ABNORMAL LOW (ref 11.3–14.9)
LYMPHOCYTES ABSOLUTE COUNT: 1.2 10*9/L (ref 1.1–3.6)
LYMPHOCYTES RELATIVE PERCENT: 19.7 %
MEAN CORPUSCULAR HEMOGLOBIN CONC: 35.1 g/dL (ref 32.0–36.0)
MEAN CORPUSCULAR HEMOGLOBIN: 34.5 pg — ABNORMAL HIGH (ref 25.9–32.4)
MEAN CORPUSCULAR VOLUME: 98.2 fL — ABNORMAL HIGH (ref 77.6–95.7)
MEAN PLATELET VOLUME: 8.3 fL (ref 6.8–10.7)
MONOCYTES ABSOLUTE COUNT: 0.6 10*9/L (ref 0.3–0.8)
MONOCYTES RELATIVE PERCENT: 9.9 %
NEUTROPHILS ABSOLUTE COUNT: 4.2 10*9/L (ref 1.8–7.8)
NEUTROPHILS RELATIVE PERCENT: 67.7 %
PLATELET COUNT: 144 10*9/L — ABNORMAL LOW (ref 150–450)
RED BLOOD CELL COUNT: 2.83 10*12/L — ABNORMAL LOW (ref 3.95–5.13)
RED CELL DISTRIBUTION WIDTH: 17.5 % — ABNORMAL HIGH (ref 12.2–15.2)
WBC ADJUSTED: 6.2 10*9/L (ref 3.6–11.2)

## 2023-12-11 LAB — PROTIME-INR
INR: 0.9
PROTIME: 10.3 s (ref 9.9–12.6)

## 2023-12-11 LAB — PHOSPHORUS: PHOSPHORUS: 4 mg/dL (ref 2.4–5.1)

## 2023-12-11 MED ADMIN — valACYclovir (VALTREX) tablet 500 mg: 500 mg | ORAL | @ 01:00:00 | Stop: 2023-12-24

## 2023-12-11 MED ADMIN — midazolam (VERSED) injection 1 mg: 1 mg | INTRAVENOUS | @ 18:00:00 | Stop: 2023-12-11

## 2023-12-11 MED ADMIN — insulin lispro (HumaLOG) injection 3 Units: 3 [IU] | SUBCUTANEOUS | @ 13:00:00

## 2023-12-11 MED ADMIN — aspirin chewable tablet 81 mg: 81 mg | ORAL | @ 13:00:00

## 2023-12-11 MED ADMIN — insulin lispro (HumaLOG) injection 0-6 Units: 0-6 [IU] | SUBCUTANEOUS | @ 01:00:00

## 2023-12-11 MED ADMIN — dapsone tablet 100 mg: 100 mg | ORAL | @ 13:00:00 | Stop: 2023-12-25

## 2023-12-11 MED ADMIN — losartan (COZAAR) tablet 25 mg: 25 mg | ORAL | @ 13:00:00

## 2023-12-11 MED ADMIN — insulin NPH (HumuLIN,NovoLIN) injection 18 Units: 18 [IU] | SUBCUTANEOUS | @ 01:00:00

## 2023-12-11 MED ADMIN — acetaminophen (TYLENOL) tablet 650 mg: 650 mg | ORAL | @ 22:00:00 | Stop: 2023-12-11

## 2023-12-11 MED ADMIN — cetirizine (ZYRTEC) tablet 10 mg: 10 mg | ORAL | @ 13:00:00

## 2023-12-11 MED ADMIN — insulin NPH (HumuLIN,NovoLIN) injection 18 Units: 18 [IU] | SUBCUTANEOUS | @ 13:00:00

## 2023-12-11 MED ADMIN — methotrexate (Preservative Free) 12 mg, hydrocortisone sod succ (Solu-CORTEF) 50 mg in sodium chloride (NS) 0.9 % 6 mL INTRATHECAL syringe: INTRATHECAL | @ 18:00:00 | Stop: 2023-12-11

## 2023-12-11 MED ADMIN — insulin lispro (HumaLOG) injection 3 Units: 3 [IU] | SUBCUTANEOUS | @ 22:00:00

## 2023-12-11 MED ADMIN — insulin lispro (HumaLOG) injection 0-6 Units: 0-6 [IU] | SUBCUTANEOUS | @ 22:00:00

## 2023-12-11 MED ADMIN — insulin lispro (HumaLOG) injection 3 Units: 3 [IU] | SUBCUTANEOUS | @ 17:00:00

## 2023-12-11 MED ADMIN — letermovir (PREVYMIS) tablet 480 mg: 480 mg | ORAL | @ 01:00:00 | Stop: 2023-12-25

## 2023-12-11 MED ADMIN — PONATinib (ICLUSIG) tablet 30 mg ***PATIENT SUPPLIED***: 30 mg | ORAL | @ 01:00:00

## 2023-12-11 MED ADMIN — escitalopram oxalate (LEXAPRO) tablet 10 mg: 10 mg | ORAL | @ 13:00:00 | Stop: 2023-12-25

## 2023-12-11 NOTE — Unmapped (Signed)
 VSS and afebrile. Pt denies any pain, nausea or diarrhea. Pt scheduled to get an LP later today. Pt denies any other needs or concerns.     Problem: Adult Inpatient Plan of Care  Goal: Plan of Care Review  Outcome: Ongoing - Unchanged  Goal: Patient-Specific Goal (Individualized)  Outcome: Ongoing - Unchanged  Goal: Absence of Hospital-Acquired Illness or Injury  Outcome: Ongoing - Unchanged  Intervention: Identify and Manage Fall Risk  Recent Flowsheet Documentation  Taken 12/10/2023 2105 by Benedict Brain, RN  Safety Interventions: nonskid shoes/slippers when out of bed  Goal: Optimal Comfort and Wellbeing  Outcome: Ongoing - Unchanged  Goal: Readiness for Transition of Care  Outcome: Ongoing - Unchanged  Goal: Rounds/Family Conference  Outcome: Ongoing - Unchanged     Problem: Fall Injury Risk  Goal: Absence of Fall and Fall-Related Injury  Outcome: Ongoing - Unchanged  Intervention: Promote Injury-Free Environment  Recent Flowsheet Documentation  Taken 12/10/2023 2105 by Benedict Brain, RN  Safety Interventions: nonskid shoes/slippers when out of bed

## 2023-12-11 NOTE — Unmapped (Signed)
 Daily Progress Note      Principal Problem:    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)    Active Problems:    Blurred vision, bilateral    Hypertension    Headache    Metastasis to brain      Drug-induced injury of liver    Type 2 diabetes mellitus, with long-term current use of insulin       Hyperlipidemia       LOS: 17 days     Assessment/Plan:Patricia [name] is an 48 y.o. 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 Started on IT methotrexate .    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 Started on biweekly IT methotrexate . MDR positive  Mon/Thurs schedule for LP with IT MTX.   Last LP 5/8 with 7 nucleated cells and blast present - Next LP scheduled for today.   If no cells present plan to start blinatumomab  during this inpatient stay  If nucleated cells still present plan for discharge and outpatient LP scheduled for Friday (5/16). Will continue biweekly LP outpatient until no cells present and then will plan for scheduled admission for blinatumomab  initiation.   Lovenox  to restart after LP.   Headache, improved: dexamethasone  STOPPED 5/5  Ponatanib restarted 5/1 due to disease progression  Monitor LFTs daily, improving  Continue ASA 81mg  daily for arterial clot ppx  Continue enoxaparin  40mg  subQ for DVT ppx (hold day of LP)  Prophylaxis:valtrex , Prevymis   MDR positive. Will plan to start blinatumomab   if CSF cleared on LP.       Hypertension  History of chronic hypertension. Uptitrated to home dosage of Losartan  50 mg 5/4.   Continue PO Losartan  50 mg (home dose)  Continue Labetalol  PRN SBP>180 or DBP>120     T2DM  Suspected elevation in BG during course of steroids. Will CTM with steriod taper.   NPH 15 BID - titrating w/ gluc above goal   Increased SSI to SF 30  Meal time 3U       Chronic Problems  HLD: HOLD rosuvastatin  10 mg nightly     Impending Electrolyte Abnormality Secondary to Chemotherapy and/or IV Fluids  -Daily Electrolyte monitoring  -Replete per [facility] guidelines.     Immunocompromised status: Patient is immunocompromised secondary to disease and chemotherapy  -Antimicrobial prophylaxis as above    Impending Pancytopenia secondary to Acute Leukemia and chemotherapy:   - Transfuse hgb <7  - Transfuse plt <10K    Nutrition: Diabetic                        Subjective:     Interval History:  [name]. Feels well. Understanding to eat less sugar.     10 point ROS otherwise negative except as above in the HPI.     Objective:     Vital signs in last 24 hours:  Temp:  [36.5 ??C (97.7 ??F)-36.7 ??C (98.1 ??F)] 36.6 ??C (97.9 ??F)  Pulse:  [84-108] 84  Resp:  [16-18] 18  BP: (114-135)/(83-92) 114/85  MAP (mmHg):  [94-103] 96  SpO2:  [96 %-98 %] 96 %    Intake/Output last 3 shifts:  I/O last 3 completed shifts:  In: 247 [P.O.:247]  Out: -  Meds:  Current Facility-Administered Medications   Medication Dose Route Frequency Provider Last Rate Last Admin    albuterol  (PROVENTIL  HFA;VENTOLIN  HFA) 90 mcg/actuation inhaler 2 puff  2 puff Inhalation Q6H PRN Daphene Dys, MD        aspirin  chewable tablet 81 mg  81 mg Oral Daily Kitay, Ilona, MD   81 mg at 12/01/23 1048    bismuth  subsalicylate (PEPTO-BISMOL) oral suspension  30 mL Oral Q6H PRN Rudloff, Michael W, MD   30 mL at 11/25/23 1648    cetirizine  (ZYRTEC ) tablet 10 mg  10 mg Oral Daily Rudloff, Michael W, MD   10 mg at 12/01/23 3295    CHEMO CLARIFICATION ORDER   Other Continuous PRN Stacey Dyke, MD        CHEMO CLARIFICATION ORDER   Other Continuous PRN Stacey Dyke, MD        cyclobenzaprine  (FLEXERIL ) tablet 10 mg  10 mg Oral TID PRN Taber Sweetser C, DO   10 mg at 11/30/23 2019    dapsone  tablet 100 mg  100 mg Oral Daily Daphene Dys, MD   100 mg at 12/01/23 1884    dexAMETHasone  (DECADRON ) tablet 4 mg  4 mg Oral Q12H SCH Kitay, Ilona, MD        dextrose  50 % in water (D50W) 50 % solution 12.5 g  12.5 g Intravenous Q15 Min PRN Cookmeyer, Helga Loan, MD        diclofenac  sodium (VOLTAREN ) 1 % gel 4 g  4 g Topical QID Rudloff, Michael W, MD   4 g at 11/29/23 1133    diphenhydrAMINE  (BENADRYL ) injection  25 mg Intravenous Q6H PRN Cornett, Fletcher Humble, MD   25 mg at 11/30/23 0858    enoxaparin  (LOVENOX ) syringe 40 mg  40 mg Subcutaneous Q24H [facility] Kitay, Ilona, MD   40 mg at 12/01/23 1048    escitalopram  oxalate (LEXAPRO ) tablet 10 mg  10 mg Oral Daily Daphene Dys, MD   10 mg at 12/01/23 1660    glucagon injection 1 mg  1 mg Intramuscular Once PRN Cookmeyer, Helga Loan, MD        glucose chewable tablet 16 g  16 g Oral Q10 Min PRN Cookmeyer, Helga Loan, MD        insulin  lispro (HumaLOG ) injection 0-6 Units  0-6 Units Subcutaneous ACHS Loletha Ripper, MD   1 Units at 12/01/23 6301    insulin  lispro (HumaLOG ) injection 7 Units  7 Units Subcutaneous 3xd Meals Lichtman, Ezra S, MD   7 Units at 12/01/23 0830    insulin  NPH (HumuLIN ,NovoLIN ) injection 17 Units  17 Units Subcutaneous Q12H [facility] Lichtman, Ezra S, MD   17 Units at 12/01/23 0831    IP OKAY TO TREAT   Other Continuous PRN Stacey Dyke, MD        IP OKAY TO TREAT   Other Continuous PRN Stacey Dyke, MD        labetalol  (NORMODYNE ) injection  10 mg Intravenous Q6H PRN Teressa Ferdinand, MD   10 mg at 11/29/23 1640    letermovir  (PREVYMIS ) tablet 480 mg  480 mg Oral Daily Daphene Dys, MD   480 mg at 11/30/23 2021    lidocaine  (ASPERCREME) 4 % 1 patch  1 patch Transdermal Daily Zewdu, Abeba, MD        losartan  (COZAAR ) tablet 25 mg  25 mg Oral Daily Rudloff, Michael W, MD   25 mg at  12/01/23 5784    metoclopramide  (REGLAN ) injection 10 mg  10 mg Intravenous Q6H PRN Cornett, Fletcher Humble, MD   10 mg at 11/30/23 6962    midazolam  (VERSED ) injection 2 mg  2 mg Intravenous Once PRN Beverlyn Buckles, MD        oxyCODONE  (ROXICODONE ) immediate release tablet 5 mg  5 mg Oral Q4H PRN Cookmeyer, Helga Loan, MD        senna (SENOKOT) tablet 2 tablet  2 tablet Oral Nightly PRN Cornett, Fletcher Humble, MD        Or    polyethylene glycol (MIRALAX ) packet 17 g  17 g Oral Daily PRN Cornett, Fletcher Humble, MD        PONATinib  (ICLUSIG ) tablet 30 mg PATIENT SUPPLIED  30 mg Oral Daily Stacey Dyke, MD   30 mg at 11/30/23 2129    prochlorperazine  (COMPAZINE ) tablet 10 mg  10 mg Oral Q6H PRN Sammuel Crimes, DO        Or    prochlorperazine  (COMPAZINE ) injection 10 mg  10 mg Intravenous Q6H PRN Sammuel Crimes, DO   10 mg at 11/27/23 1847    valACYclovir  (VALTREX ) tablet 500 mg  500 mg Oral Daily Teressa Ferdinand, MD   500 mg at 11/30/23 2021       Physical Exam  General: well appearing, NAD  HEENT: no lymphadenopathy or frank JVD  CV: RRR, no murmurs  Pulm: No incr WOB/conversational dyspnea, clear lungs   Abdo: NDNT, soft  MSK: extremities warm to touch, no edema BLE        Labs:  Recent Labs     12/09/23  0520 12/09/23  0615 12/10/23  0357 12/11/23  0509   WBC 6.4  --  7.0 6.2   NEUTROABS 4.6  --  4.9 4.2   LYMPHSABS 1.2  --  1.4 1.2   HGB 10.2*  --  10.4* 9.7*   HCT 28.7*  --  29.7* 27.8*   PLT 121*  --  131* 144*   CREATININE 0.51*  --  0.52* 0.59   BUN 18  --  14 17   BILITOT 0.5  --  0.7 0.8   AST  --  43* 34 32   ALT 95*  --  91* 80*   ALKPHOS 90  --  87 86   K  --  3.9 4.1 4.0   MG 1.9  --  1.9 1.9   CALCIUM  9.4  --  9.2 9.4   NA 139  --  140 140   CL 104  --  106 107   CO2 25.0  --  24.0 25.0   PHOS 3.6  --  4.1 4.0   INR 0.89  --  0.91 0.90       Imaging:  No new.

## 2023-12-11 NOTE — Unmapped (Signed)
 Lumbar Puncture Procedure (CPT [id number]) with ultrasound guidance (CPT [phone]) with Intrathecal Chemo Administration (CPT [phone])      Pre-procedural Planning     Patient Name:: Patricia [name]  Patient MRN: [id number]    Pre-operative Diagnosis:  Administration of intrathecal chemo    Post-operative Diagnosis: Administration of intrathecal chemo    Indications:  Administration of intrathecal chemo    Contraindications to performing lumbar puncture: Patient had no contraindications. Specifically, no local skin infections over the proposed puncture site, known elevated ICP other than in idiopathic intracranial hypertension or cryptococcal meningitis, and/or an uncontrolled bleeding diathesis.     Imaging prior to lumbar puncture: Generally, imaging should be obtained prior to performing a lumbar puncture if the patient is > 72 years old, immunocompromised, has had a seizure within one week of presentation, has an abnormal level of consciousness, or an abnormal neurologic exam (including papilledema). CNS imaging has been obtained and does not indicate increased ICP.    Known Bleeding Diathesis: Patient/caregiver denies any known bleeding or platelet disorder.     Antiplatelet Agents: This patient is on an antiplatelet agent.    Systemic Anticoagulation: This patient is not on full systemic anticoagulation.    Significant Labs:  INR   Date Value Ref Range Status   12/11/2023 0.90  Final     PT   Date Value Ref Range Status   12/11/2023 10.3 9.9 - 12.6 sec Final     APTT   Date Value Ref Range Status   11/24/2023 117.8 (H) 24.8 - 38.4 sec Final     Platelet   Date Value Ref Range Status   12/11/2023 144 (L) 150 - 450 10*9/L Final       Consent: Informed consent for the procedure was obtained from Patient after explanation of the risks, including bleeding into pain, post-lumbar puncture headache, backache, bleeding that could result in paralysis, herniation and infection, and benefits, including symptomatic improvement and diagnostic information. Potential alternatives were discussed and all questions answered.  Refer to the consent documentation.     Procedure Details     Time-out performed immediately prior to the procedure. If IT chemo given, 2 providers checked chemo orders prior to beginning procedure.    Patient was placed in the right lateral decubitus position with hips and neck in flexion.    The superior aspect of the iliac crests were identified, with the traverse demarcating the L4-L5 interspace.  A bedside ultrasound was used to help identify the spinous processes at L2, L3, L4, and L5 and the intervertebral spaces were located and marked.          This area was prepped with chlorhexidine and draped in the usual sterile fashion. Sterile technique was used including antiseptics, cap, gloves, hand hygiene, mask, and sterile sheet.  Local anesthesia with 1% lidocaine  was applied subcutaneously then deep to the skin. The 20 gauge cutting (eg. Quincke) spinal needle with trocar was introduced at the L3 - L4 interspace.  CSF was obtained at 8 cm after 5 pass(es), 1 redirections.  When CSF fluid was noted an opening pressure was not obtained.  Samples were collected and sent to the lab after proper labeling.     Intrathecal MTX was administered slowly over 5 min.    The spinal needle with trocar was removed with minimal bleeding noted upon removal. A sterile bandage was placed over the puncture site after holding pressure.    Findings     6 mL of clear spinal  fluid was obtained.  The CSF was sent to lab for routine studies including cell count, protein, glucose, hemepath.          Condition     The patient tolerated the procedure well and remains in the same condition as pre-procedure.    Complications and Recommendations     None; patient tolerated the procedure well.      Requesting Service: Oncology/Hematology (MDE)    Time Requested: 5/11  Time Completed: 5/12 3p  Comments: benefits from extra lidocaine  and recent midazolam . Also helped to guide through deep breaths and focus on protruding low back towards the proceduralist

## 2023-12-12 DIAGNOSIS — C9100 Acute lymphoblastic leukemia not having achieved remission: Principal | ICD-10-CM

## 2023-12-12 LAB — COMPREHENSIVE METABOLIC PANEL
ALBUMIN: 3.5 g/dL (ref 3.4–5.0)
ALKALINE PHOSPHATASE: 83 U/L (ref 46–116)
ALT (SGPT): 72 U/L — ABNORMAL HIGH (ref 10–49)
ANION GAP: 11 mmol/L (ref 5–14)
AST (SGOT): 30 U/L (ref <=34)
BILIRUBIN TOTAL: 0.6 mg/dL (ref 0.3–1.2)
BLOOD UREA NITROGEN: 19 mg/dL (ref 9–23)
BUN / CREAT RATIO: 39
CALCIUM: 9.3 mg/dL (ref 8.7–10.4)
CHLORIDE: 105 mmol/L (ref 98–107)
CO2: 24 mmol/L (ref 20.0–31.0)
CREATININE: 0.49 mg/dL — ABNORMAL LOW (ref 0.55–1.02)
EGFR CKD-EPI (2021) FEMALE: >90 mL/min/1.73m2 (ref >=60)
GLUCOSE RANDOM: 283 mg/dL — ABNORMAL HIGH (ref 70–179)
POTASSIUM: 4.4 mmol/L (ref 3.4–4.8)
PROTEIN TOTAL: 6 g/dL (ref 5.7–8.2)
SODIUM: 140 mmol/L (ref 135–145)

## 2023-12-12 LAB — CBC W/ AUTO DIFF
BASOPHILS ABSOLUTE COUNT: 0 10*9/L (ref 0.0–0.1)
BASOPHILS RELATIVE PERCENT: 0.3 %
EOSINOPHILS ABSOLUTE COUNT: 0.1 10*9/L (ref 0.0–0.5)
EOSINOPHILS RELATIVE PERCENT: 0.7 %
HEMATOCRIT: 28 % — ABNORMAL LOW (ref 34.0–44.0)
HEMOGLOBIN: 9.3 g/dL — ABNORMAL LOW (ref 11.3–14.9)
LYMPHOCYTES ABSOLUTE COUNT: 1.1 10*9/L (ref 1.1–3.6)
LYMPHOCYTES RELATIVE PERCENT: 13.6 %
MEAN CORPUSCULAR HEMOGLOBIN CONC: 33.4 g/dL (ref 32.0–36.0)
MEAN CORPUSCULAR HEMOGLOBIN: 33.3 pg — ABNORMAL HIGH (ref 25.9–32.4)
MEAN CORPUSCULAR VOLUME: 99.7 fL — ABNORMAL HIGH (ref 77.6–95.7)
MEAN PLATELET VOLUME: 8.3 fL (ref 6.8–10.7)
MONOCYTES ABSOLUTE COUNT: 0.5 10*9/L (ref 0.3–0.8)
MONOCYTES RELATIVE PERCENT: 6.4 %
NEUTROPHILS ABSOLUTE COUNT: 6.4 10*9/L (ref 1.8–7.8)
NEUTROPHILS RELATIVE PERCENT: 79 %
PLATELET COUNT: 149 10*9/L — ABNORMAL LOW (ref 150–450)
RED BLOOD CELL COUNT: 2.8 10*12/L — ABNORMAL LOW (ref 3.95–5.13)
RED CELL DISTRIBUTION WIDTH: 18.1 % — ABNORMAL HIGH (ref 12.2–15.2)
WBC ADJUSTED: 8.1 10*9/L (ref 3.6–11.2)

## 2023-12-12 LAB — MAGNESIUM: MAGNESIUM: 2 mg/dL (ref 1.6–2.6)

## 2023-12-12 LAB — PROTIME-INR
INR: 0.93
PROTIME: 10.6 s (ref 9.9–12.6)

## 2023-12-12 LAB — C-REACTIVE PROTEIN: C-REACTIVE PROTEIN: 11.9 mg/L — ABNORMAL HIGH (ref <=10.0)

## 2023-12-12 LAB — PHOSPHORUS: PHOSPHORUS: 3.3 mg/dL (ref 2.4–5.1)

## 2023-12-12 MED ADMIN — polyethylene glycol (MIRALAX) packet 17 g: 17 g | ORAL | @ 01:00:00

## 2023-12-12 MED ADMIN — aspirin chewable tablet 81 mg: 81 mg | ORAL | @ 12:00:00

## 2023-12-12 MED ADMIN — insulin lispro (HumaLOG) injection 0-6 Units: 0-6 [IU] | SUBCUTANEOUS | @ 12:00:00

## 2023-12-12 MED ADMIN — insulin lispro (HumaLOG) injection 3 Units: 3 [IU] | SUBCUTANEOUS | @ 12:00:00

## 2023-12-12 MED ADMIN — blinatumomab (BLINCYTO) 28 mcg/day in sodium chloride (non-PVC) 0.9 % IVPB (24-HR INFUSION): 28 ug/d | INTRAVENOUS | @ 22:00:00 | Stop: 2023-12-15

## 2023-12-12 MED ADMIN — alteplase (ACTIVase) injection small catheter clearance 1 mg: 1 mg | INTRAVENOUS | @ 21:00:00 | Stop: 2023-12-12

## 2023-12-12 MED ADMIN — insulin lispro (HumaLOG) injection 0-6 Units: 0-6 [IU] | SUBCUTANEOUS | @ 21:00:00

## 2023-12-12 MED ADMIN — letermovir (PREVYMIS) tablet 480 mg: 480 mg | ORAL | @ 01:00:00 | Stop: 2023-12-25

## 2023-12-12 MED ADMIN — insulin NPH (HumuLIN,NovoLIN) injection 18 Units: 18 [IU] | SUBCUTANEOUS | @ 12:00:00 | Stop: 2023-12-12

## 2023-12-12 MED ADMIN — insulin lispro (HumaLOG) injection 0-6 Units: 0-6 [IU] | SUBCUTANEOUS | @ 17:00:00

## 2023-12-12 MED ADMIN — losartan (COZAAR) tablet 25 mg: 25 mg | ORAL | @ 12:00:00

## 2023-12-12 MED ADMIN — cetirizine (ZYRTEC) tablet 10 mg: 10 mg | ORAL | @ 12:00:00

## 2023-12-12 MED ADMIN — insulin lispro (HumaLOG) injection 3 Units: 3 [IU] | SUBCUTANEOUS | @ 17:00:00

## 2023-12-12 MED ADMIN — PONATinib (ICLUSIG) tablet 30 mg ***PATIENT SUPPLIED***: 30 mg | ORAL | @ 01:00:00

## 2023-12-12 MED ADMIN — valACYclovir (VALTREX) tablet 500 mg: 500 mg | ORAL | @ 01:00:00 | Stop: 2023-12-24

## 2023-12-12 MED ADMIN — escitalopram oxalate (LEXAPRO) tablet 10 mg: 10 mg | ORAL | @ 12:00:00 | Stop: 2023-12-25

## 2023-12-12 MED ADMIN — insulin lispro (HumaLOG) injection 3 Units: 3 [IU] | SUBCUTANEOUS | @ 21:00:00

## 2023-12-12 MED ADMIN — dapsone tablet 100 mg: 100 mg | ORAL | @ 12:00:00 | Stop: 2023-12-25

## 2023-12-12 MED ADMIN — insulin NPH (HumuLIN,NovoLIN) injection 18 Units: 18 [IU] | SUBCUTANEOUS | @ 01:00:00

## 2023-12-12 MED ADMIN — dexAMETHasone (DECADRON) 4 mg/mL injection 16 mg: 16 mg | INTRAVENOUS | @ 21:00:00 | Stop: 2023-12-12

## 2023-12-12 MED ADMIN — insulin lispro (HumaLOG) injection 0-6 Units: 0-6 [IU] | SUBCUTANEOUS | @ 01:00:00

## 2023-12-12 NOTE — Unmapped (Signed)
 Pharmacy: First Cycle Chemotherapy Patient Education  Patricia [name] is a 48 y.o. year old with Ph(-) B-AML who was admitted to [facility] and will receive their first cycle of chemotherapy. Patient education reviewing the chemotherapy is provided to the patient by the oncology pharmacy team.  Patient and her husband [name]) were present for education.      Chemotherapy regimen/agents: Blinatumomab   Day 1 of chemotherapy: 12/12/23  Central Venous Access Device: port    For each of the following items listed below and where applicable, signs and symptoms of adverse effects as well as self-care management were reviewed. Additionally, chemotherapy scheduling, chemotherapy agents, mechanism of action, exposure risk mitigation for caregivers, drug-drug/food/herbal interactions and supportive care measures were also reviewed with the patient.     Side effects discussed included but were not limited to: neutropenia/febrile neutropenia, thrombocytopenia, nausea/vomiting, hepatotoxicity, CRS, and neurotoxicity.    A/P:  1. The patient verbalized understanding of this information. The patient's drug profile was reviewed for drug interactions with the chemotherapy and no interactions were present at the time of review. The patient was given educational materials that included resources from the [facility] patient education library, HOPA, Lexi-Comp, EPIC, Product/process development scientist.     Approximate time spent with patient: 25 minutes.    Thank you, please feel free to contact with any questions or concerns.    Allie Area,  PharmD

## 2023-12-12 NOTE — Unmapped (Signed)
 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 female, with a pertinent PMHx of Ph+ ALL, who presented with a 1 month hx of headaches. 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    #Headaches  #Ph+ ALL with CNS involvement  Hx Ph+ ALL previously in CR on maintenance vincristine /ponatinib . Presented after having a 1 month hx of headaches; workup confirmed CNS ALL relapse, for which biweekly IT MTX was started. LP with CSF studies were sent 5/12; on 5/13, CSF had undetectable blasts. As such, IT MTX dosing was reduced to weekly and blinatumomab  was started inpatient.  - Start blinatumomab  today for MDR tx per [facility] protocol. Monitor for CRS/ICANS (higher risk given CNS ALL)  - Continue LP with IT MTX every Monday  - Continue ponatinib  30 mg daily  - Continue dapsone  100 mg daily, letermovir  480 mg daily, Valtrex  500 mg daily for ppx    #T2DM on insulin   Takes Lantus  10 u qHS at home. Last A1c 7.1% in March 2025, c/w well-controlled T2DM.  - Increase insulin  NPH to 22 u BID. Plan to consolidate to insulin  glargine 5/14.  - Continue SSI (ISF 30)  - Continue insulin  lispro 3 u AC. Will likely increase mealtime insulin  5/14.    #HTN  Hx primary HTN, takes losartan  50 mg daily at home.  - Continue home losartan  50 mg daily    #HLD  - Hold home rosuvastatin  10 mg qHS given hx transaminitis. Can likely restart 5/14.    Subjective:     Interval History: [name] and VSS. Continues with mild back pain s/p LP but no signs of post-LP headache. Asked insightful questions about CSF studies and whether she would receive blinatumomab  in hospital or not.    10 point ROS otherwise negative except as above in the HPI.     Objective:     Vital signs in last 24 hours:  Temp:  [36.4 ??C (97.5 ??F)-36.7 ??C (98.1 ??F)] 36.6 ??C (97.9 ??F)  Pulse:  [80-110] 110  Resp:  [16-18] 18  BP: (109-130)/(69-91) 122/87  MAP (mmHg):  [80-104] 97  SpO2:  [94 %-98 %] 95 %    Intake/Output last 3 shifts:  No intake/output data recorded.    Meds:  Current Facility-Administered Medications   Medication Dose Route Frequency Provider Last Rate Last Admin    acetaminophen  (TYLENOL ) tablet 500 mg  500 mg Oral Q8H PRN Claria Crofts, MD        albuterol  (PROVENTIL  HFA;VENTOLIN  HFA) 90 mcg/actuation inhaler 2 puff  2 puff Inhalation Q6H PRN Daphene Dys, MD        alteplase  (ACTIVase ) injection small catheter clearance 2 mg  2 mg Intravenous Once PRN Claria Crofts, MD        aspirin  chewable tablet 81 mg  81 mg Oral Daily Kitay, Ilona, MD   81 mg at 12/12/23 1610    bismuth  subsalicylate (PEPTO-BISMOL) oral suspension  30 mL Oral Q6H PRN Loletha Ripper, MD  30 mL at 11/25/23 1648    blinatumomab  (BLINCYTO ) 28 mcg/day in sodium chloride  (non-PVC) 0.9 % IVPB (24-HR INFUSION)  28 mcg/day Intravenous over 24 hr Painschab, Jeannie Milo, MD        cetirizine  (ZYRTEC ) tablet 10 mg  10 mg Oral Daily Rudloff, Jahnasia Tatum W, MD   10 mg at 12/12/23 0821    cyclobenzaprine  (FLEXERIL ) tablet 5 mg  5 mg Oral TID PRN Stone, Ellet C, DO        dapsone  tablet 100 mg  100 mg Oral Daily Daphene Dys, MD   100 mg at 12/12/23 1610    dexAMETHasone  (DECADRON ) 4 mg/mL injection 20 mg  20 mg Intravenous Once PRN Painschab, Jeannie Milo, MD        dextrose  50 % in water (D50W) 50 % solution 12.5 g  12.5 g Intravenous Q15 Min PRN Cookmeyer, Helga Loan, MD        diclofenac  sodium (VOLTAREN ) 1 % gel 4 g  4 g Topical QID PRN Stone, Ellet C, DO        diphenhydrAMINE  (BENADRYL ) injection 25 mg  25 mg Intravenous Q4H PRN Painschab, Jeannie Milo, MD        diphenhydrAMINE  (BENADRYL ) injection  25 mg Intravenous Q6H PRN Cornett, Fletcher Humble, MD   25 mg at 11/30/23 0858    EPINEPHrine (EPIPEN) injection 0.3 mg  0.3 mg Intramuscular Daily PRN Painschab, Jeannie Milo, MD        escitalopram  oxalate (LEXAPRO ) tablet 10 mg  10 mg Oral Daily Daphene Dys, MD   10 mg at 12/12/23 9604    famotidine  (PF) (PEPCID ) injection 20 mg  20 mg Intravenous Q4H PRN Painschab, Jeannie Milo, MD        glucagon injection 1 mg  1 mg Intramuscular Once PRN Cookmeyer, Helga Loan, MD        glucose chewable tablet 16 g  16 g Oral Q10 Min PRN Cookmeyer, Helga Loan, MD        insulin  lispro (HumaLOG ) injection 0-6 Units  0-6 Units Subcutaneous ACHS Loletha Ripper, MD   4 Units at 12/12/23 1657    insulin  lispro (HumaLOG ) injection 3 Units  3 Units Subcutaneous 3xd Meals Stone, Ellet C, DO   3 Units at 12/12/23 1658    [Provider Hold] insulin  lispro (HumaLOG ) injection 8 Units  8 Units Subcutaneous 3xd Meals Stone, Ellet C, DO   8 Units at 12/03/23 1805    insulin  NPH (HumuLIN ,NovoLIN ) injection 22 Units  22 Units Subcutaneous Q12H [facility] Claria Crofts, MD        IP OKAY TO TREAT   Other Continuous PRN Painschab, Jeannie Milo, MD        labetalol  (NORMODYNE ) injection  10 mg Intravenous Q6H PRN Teressa Ferdinand, MD   10 mg at 11/29/23 1640    letermovir  (PREVYMIS ) tablet 480 mg  480 mg Oral Daily Daphene Dys, MD   480 mg at 12/11/23 2055    lidocaine  (ASPERCREME) 4 % 1 patch  1 patch Transdermal Daily PRN Stone, Ellet C, DO        losartan  (COZAAR ) tablet 25 mg  25 mg Oral Daily Duggan, Patrick, MD   25 mg at 12/12/23 5409    methylPREDNISolone  sodium succinate  (PF) (SOLU-Medrol ) injection 125 mg  125 mg Intravenous Q4H PRN Painschab, Jeannie Milo, MD        metoclopramide  (REGLAN ) injection 10 mg  10 mg Intravenous Q6H PRN Cornett, Fletcher Humble, MD  10 mg at 11/30/23 0858    polyethylene glycol (MIRALAX ) packet 17 g  17 g Oral BID Stone, Ellet C, DO   17 g at 12/11/23 2055    PONATinib  (ICLUSIG ) tablet 30 mg *Patient Supplied Home Med*  30 mg Oral Daily Painschab, Jeannie Milo, MD prochlorperazine  (COMPAZINE ) tablet 10 mg  10 mg Oral Q6H PRN Avery, Natalia, DO        Or    prochlorperazine  (COMPAZINE ) injection 10 mg  10 mg Intravenous Q6H PRN Avery, Natalia, DO   10 mg at 11/27/23 1847    psyllium (METAMUCIL) packet 1 packet  1 packet Oral Daily Zewdu, Abeba, MD   1 packet at 12/05/23 1610    sodium chloride  0.9% (NS) bolus 1,000 mL  1,000 mL Intravenous Daily PRN Painschab, Jeannie Milo, MD        valACYclovir  (VALTREX ) tablet 500 mg  500 mg Oral Daily Teressa Ferdinand, MD   500 mg at 12/11/23 2055     Physical Exam  Constitutional:       General: She is not in acute distress.     Appearance: She is obese. She is not ill-appearing, toxic-appearing or diaphoretic.   HENT:      Head: Normocephalic and atraumatic.      Nose: Nose normal.      Mouth/Throat:      Mouth: Mucous membranes are moist.   Eyes:      General: No scleral icterus.     Pupils: Pupils are equal, round, and reactive to light.   Cardiovascular:      Rate and Rhythm: Normal rate and regular rhythm.      Pulses: Normal pulses.      Heart sounds: Normal heart sounds.   Pulmonary:      Effort: Pulmonary effort is normal. No respiratory distress.   Abdominal:      Palpations: Abdomen is soft.      Tenderness: There is no abdominal tenderness.   Musculoskeletal:         General: Tenderness (LP site, mild) present. Normal range of motion.      Cervical back: Normal range of motion.      Right lower leg: No edema.      Left lower leg: No edema.   Skin:     General: Skin is warm.      Capillary Refill: Capillary refill takes less than 2 seconds.   Neurological:      General: No focal deficit present.      Mental Status: She is alert and oriented to person, place, and time.   Psychiatric:         Mood and Affect: Mood normal.         Behavior: Behavior normal.       Labs:  Recent Labs     12/10/23  0357 12/11/23  0509 12/12/23  0511   WBC 7.0 6.2 8.1   NEUTROABS 4.9 4.2 6.4   LYMPHSABS 1.4 1.2 1.1   HGB 10.4* 9.7* 9.3*   HCT 29.7* 27.8* 28.0*   PLT 131* 144* 149*   CREATININE 0.52* 0.59 0.49*   BUN 14 17 19    BILITOT 0.7 0.8 0.6   AST 34 32 30   ALT 91* 80* 72*   ALKPHOS 87 86 83   K 4.1 4.0 4.4   MG 1.9 1.9 2.0   CALCIUM  9.2 9.4 9.3   NA 140 140 140   CL 106 107 105   CO2  24.0 25.0 24.0   PHOS 4.1 4.0 3.3   INR 0.91 0.90 0.93   CRP  --  11.9*  --      Imaging:  No interval images/scans    Claria Crofts, MD  12/12/2023  7:16 AM    Hematology/Oncology Department   [facility]   Group Pager: [phone]

## 2023-12-12 NOTE — Unmapped (Signed)
 OUTPATIENT SOCIAL WORKER [LCSW] PROGRESS NOTE     Service: Adult Hematology Oncology        Patient Kanady) is a 48 y.o. female with acute lymphoblastic leukemia .     SW reached out to RN, Futures trader to see if she can schedule a IP SW to see her.       LCSW Plan for Follow-Up: LCSW will continue to be available for psychosocial support and resource assistance as needed/appropriate.    Jill [facility]pe [facility]  Ambulatory Social Worker II  [phone]

## 2023-12-12 NOTE — Unmapped (Signed)
 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  Pt was seen at the request of Lamond Pilot, MD [facility] - Hematology Res Floor Team (MED E - Tower) /Oncology/Hematology (MDE)) in consultation for lumbar puncture    Uncomplicated lumbar puncture with intrathecal chemotherapy performed on 5/12. Please see procedure note for details. Doing well post procedure. CSF studies without any identified blasts.    Thank you for this consult. We will sign off. Please re-consult the Medicine Procedure Service if we can be of further assistance.  ___________________________________________________________________    Subjective:   Felt sore with some tailbone pain post procedure. Was similar, but a bit worse than normal. Has some soreness of her back today. Otherwise doing ok.    Objective:   Vital signs in last 24 hours:  Temp:  [36.4 ??C (97.5 ??F)-36.9 ??C (98.4 ??F)] 36.7 ??C (98.1 ??F)  Pulse:  [80-98] 91  Resp:  [16-18] 18  BP: (109-130)/(69-91) 122/85  MAP (mmHg):  [80-104] 96  SpO2:  [94 %-98 %] 94 %    Intake/Output last 3 shifts:  No intake/output data recorded.    Physical Exam:  GEN: NAD, sitting in bed  EYES: EOMI  ENT: MMM  PULM: Comfortable WOB  BACK: no significant edema or ecchymosis (though challenging to tell in the setting of tattoo), not TTP over LP and LP attempt sites    Labs/Studies/Diagnostics:   Data Review:  All lab results last 24 hours:    Recent Results (from the past 24 hours)   POCT Glucose    Collection Time: 12/11/23  3:35 PM   Result Value Ref Range    Glucose, POC 166 70 - 179 mg/dL   POCT Glucose    Collection Time: 12/11/23  8:41 PM   Result Value Ref Range    Glucose, POC 314 (H) 70 - 179 mg/dL   Comprehensive Metabolic Panel    Collection Time: 12/12/23  5:11 AM   Result Value Ref Range    Sodium 140 135 - 145 mmol/L    Potassium 4.4 3.4 - 4.8 mmol/L    Chloride 105 98 - 107 mmol/L    CO2 24.0 20.0 - 31.0 mmol/L    Anion Gap 11 5 - 14 mmol/L    BUN 19 9 - 23 mg/dL    Creatinine 0.98 (L) 0.55 - 1.02 mg/dL    BUN/Creatinine Ratio 39     eGFR CKD-EPI (2021) Female >90 >=60 mL/min/1.76m2    Glucose 283 (H) 70 - 179 mg/dL    Calcium  9.3 8.7 - 10.4 mg/dL    Albumin 3.5 3.4 - 5.0 g/dL    Total Protein 6.0 5.7 - 8.2 g/dL    Total Bilirubin 0.6 0.3 -  1.2 mg/dL    AST 30 <=16 U/L    ALT 72 (H) 10 - 49 U/L    Alkaline Phosphatase 83 46 - 116 U/L   Magnesium  Level    Collection Time: 12/12/23  5:11 AM   Result Value Ref Range    Magnesium  2.0 1.6 - 2.6 mg/dL   Phosphorus Level    Collection Time: 12/12/23  5:11 AM   Result Value Ref Range    Phosphorus 3.3 2.4 - 5.1 mg/dL   PT-INR    Collection Time: 12/12/23  5:11 AM   Result Value Ref Range    PT 10.6 9.9 - 12.6 sec    INR 0.93    CBC w/ Differential    Collection Time: 12/12/23  5:11 AM   Result Value Ref Range    WBC 8.1 3.6 - 11.2 10*9/L    RBC 2.80 (L) 3.95 - 5.13 10*12/L    HGB 9.3 (L) 11.3 - 14.9 g/dL    HCT 10.9 (L) 60.4 - 44.0 %    MCV 99.7 (H) 77.6 - 95.7 fL    MCH 33.3 (H) 25.9 - 32.4 pg    MCHC 33.4 32.0 - 36.0 g/dL    RDW 54.0 (H) 98.1 - 15.2 %    MPV 8.3 6.8 - 10.7 fL    Platelet 149 (L) 150 - 450 10*9/L    Neutrophils % 79.0 %    Lymphocytes % 13.6 %    Monocytes % 6.4 %    Eosinophils % 0.7 %    Basophils % 0.3 %    Absolute Neutrophils 6.4 1.8 - 7.8 10*9/L    Absolute Lymphocytes 1.1 1.1 - 3.6 10*9/L Absolute Monocytes 0.5 0.3 - 0.8 10*9/L    Absolute Eosinophils 0.1 0.0 - 0.5 10*9/L    Absolute Basophils 0.0 0.0 - 0.1 10*9/L    Anisocytosis Slight (A) Not Present   POCT Glucose    Collection Time: 12/12/23  7:35 AM   Result Value Ref Range    Glucose, POC 212 (H) 70 - 179 mg/dL   POCT Glucose    Collection Time: 12/12/23 11:24 AM   Result Value Ref Range    Glucose, POC 314 (H) 70 - 179 mg/dL     I have reviewed the labs and studies from the last 24 hours.    Current Hospital Medications:   Scheduled Meds:   aspirin   81 mg Oral Daily    blinatumomab (BLINCYTO) 28 mcg/day in sodium chloride  (non-PVC) 0.9 % IVPB (24-HR INFUSION)  28 mcg/day Intravenous over 24 hr    cetirizine   10 mg Oral Daily    dapsone   100 mg Oral Daily    dexAMETHasone   16 mg Intravenous Once    escitalopram  oxalate  10 mg Oral Daily    insulin  lispro  0-6 Units Subcutaneous ACHS    insulin  lispro  3 Units Subcutaneous 3xd Meals    [Provider Hold] insulin  lispro  8 Units Subcutaneous 3xd Meals    insulin  NPH  22 Units Subcutaneous Q12H SCH    letermovir   480 mg Oral Daily    losartan   25 mg Oral Daily    polyethylene glycol  17 g Oral BID    PONATinib   30 mg Oral Daily    psyllium  1 packet Oral Daily    valACYclovir   500 mg Oral Daily     Continuous Infusions:   IP okay to treat       PRN Meds:.acetaminophen , albuterol , bismuth   subsalicylate, cyclobenzaprine , dexAMETHasone , dextrose  in water, diclofenac  sodium, diphenhydrAMINE , diphenhydrAMINE , EPINEPHrine IM, famotidine  (PEPCID ) IV, glucagon, glucose, IP okay to treat, labetalol , lidocaine , methylPREDNISolone  sodium succinate , metoclopramide , prochlorperazine  **OR** prochlorperazine , sodium chloride  0.9%

## 2023-12-13 DIAGNOSIS — C9100 Acute lymphoblastic leukemia not having achieved remission: Principal | ICD-10-CM

## 2023-12-13 LAB — PROTIME-INR
INR: 0.96
PROTIME: 10.9 s (ref 9.9–12.6)

## 2023-12-13 LAB — CBC W/ AUTO DIFF
BASOPHILS ABSOLUTE COUNT: 0 10*9/L (ref 0.0–0.1)
BASOPHILS RELATIVE PERCENT: 0.1 %
EOSINOPHILS ABSOLUTE COUNT: 0 10*9/L (ref 0.0–0.5)
EOSINOPHILS RELATIVE PERCENT: 0 %
HEMATOCRIT: 27.8 % — ABNORMAL LOW (ref 34.0–44.0)
HEMOGLOBIN: 9.7 g/dL — ABNORMAL LOW (ref 11.3–14.9)
LYMPHOCYTES ABSOLUTE COUNT: 0.2 10*9/L — ABNORMAL LOW (ref 1.1–3.6)
LYMPHOCYTES RELATIVE PERCENT: 1.8 %
MEAN CORPUSCULAR HEMOGLOBIN CONC: 34.7 g/dL (ref 32.0–36.0)
MEAN CORPUSCULAR HEMOGLOBIN: 35 pg — ABNORMAL HIGH (ref 25.9–32.4)
MEAN CORPUSCULAR VOLUME: 100.8 fL — ABNORMAL HIGH (ref 77.6–95.7)
MEAN PLATELET VOLUME: 8.3 fL (ref 6.8–10.7)
MONOCYTES ABSOLUTE COUNT: 0.1 10*9/L — ABNORMAL LOW (ref 0.3–0.8)
MONOCYTES RELATIVE PERCENT: 1.2 %
NEUTROPHILS ABSOLUTE COUNT: 10.2 10*9/L — ABNORMAL HIGH (ref 1.8–7.8)
NEUTROPHILS RELATIVE PERCENT: 96.9 %
PLATELET COUNT: 139 10*9/L — ABNORMAL LOW (ref 150–450)
RED BLOOD CELL COUNT: 2.76 10*12/L — ABNORMAL LOW (ref 3.95–5.13)
RED CELL DISTRIBUTION WIDTH: 18.6 % — ABNORMAL HIGH (ref 12.2–15.2)
WBC ADJUSTED: 10.5 10*9/L (ref 3.6–11.2)

## 2023-12-13 LAB — COMPREHENSIVE METABOLIC PANEL
ALBUMIN: 3.8 g/dL (ref 3.4–5.0)
ALKALINE PHOSPHATASE: 89 U/L (ref 46–116)
ALT (SGPT): 92 U/L — ABNORMAL HIGH (ref 10–49)
ANION GAP: 10 mmol/L (ref 5–14)
AST (SGOT): 40 U/L — ABNORMAL HIGH (ref <=34)
BILIRUBIN TOTAL: 0.7 mg/dL (ref 0.3–1.2)
BLOOD UREA NITROGEN: 21 mg/dL (ref 9–23)
BUN / CREAT RATIO: 38
CALCIUM: 9.7 mg/dL (ref 8.7–10.4)
CHLORIDE: 105 mmol/L (ref 98–107)
CO2: 23 mmol/L (ref 20.0–31.0)
CREATININE: 0.55 mg/dL (ref 0.55–1.02)
EGFR CKD-EPI (2021) FEMALE: >90 mL/min/1.73m2 (ref >=60)
GLUCOSE RANDOM: 345 mg/dL — ABNORMAL HIGH (ref 70–179)
POTASSIUM: 4.5 mmol/L (ref 3.4–4.8)
PROTEIN TOTAL: 6.8 g/dL (ref 5.7–8.2)
SODIUM: 138 mmol/L (ref 135–145)

## 2023-12-13 LAB — MAGNESIUM: MAGNESIUM: 1.9 mg/dL (ref 1.6–2.6)

## 2023-12-13 LAB — C-REACTIVE PROTEIN: C-REACTIVE PROTEIN: 28.5 mg/L — ABNORMAL HIGH (ref <=10.0)

## 2023-12-13 LAB — PHOSPHORUS: PHOSPHORUS: 3.1 mg/dL (ref 2.4–5.1)

## 2023-12-13 MED ADMIN — insulin lispro (HumaLOG) injection 0-6 Units: 0-6 [IU] | SUBCUTANEOUS | @ 22:00:00

## 2023-12-13 MED ADMIN — cetirizine (ZYRTEC) tablet 10 mg: 10 mg | ORAL | @ 12:00:00

## 2023-12-13 MED ADMIN — dapsone tablet 100 mg: 100 mg | ORAL | @ 12:00:00 | Stop: 2023-12-25

## 2023-12-13 MED ADMIN — insulin lispro (HumaLOG) injection 10 Units: 10 [IU] | SUBCUTANEOUS | @ 22:00:00

## 2023-12-13 MED ADMIN — insulin lispro (HumaLOG) injection 3 Units: 3 [IU] | SUBCUTANEOUS | @ 12:00:00 | Stop: 2023-12-13

## 2023-12-13 MED ADMIN — acetaminophen (TYLENOL) tablet 500 mg: 500 mg | ORAL | @ 22:00:00

## 2023-12-13 MED ADMIN — insulin lispro (HumaLOG) injection 0-6 Units: 0-6 [IU] | SUBCUTANEOUS | @ 12:00:00

## 2023-12-13 MED ADMIN — letermovir (PREVYMIS) tablet 480 mg: 480 mg | ORAL | @ 01:00:00 | Stop: 2023-12-25

## 2023-12-13 MED ADMIN — insulin NPH (HumuLIN,NovoLIN) injection 22 Units: 22 [IU] | SUBCUTANEOUS | @ 12:00:00 | Stop: 2023-12-13

## 2023-12-13 MED ADMIN — insulin lispro (HumaLOG) injection 0-6 Units: 0-6 [IU] | SUBCUTANEOUS | @ 16:00:00

## 2023-12-13 MED ADMIN — valACYclovir (VALTREX) tablet 500 mg: 500 mg | ORAL | @ 01:00:00 | Stop: 2023-12-24

## 2023-12-13 MED ADMIN — insulin lispro (HumaLOG) injection 0-6 Units: 0-6 [IU] | SUBCUTANEOUS | @ 01:00:00

## 2023-12-13 MED ADMIN — losartan (COZAAR) tablet 25 mg: 25 mg | ORAL | @ 12:00:00

## 2023-12-13 MED ADMIN — aspirin chewable tablet 81 mg: 81 mg | ORAL | @ 12:00:00

## 2023-12-13 MED ADMIN — escitalopram oxalate (LEXAPRO) tablet 10 mg: 10 mg | ORAL | @ 12:00:00 | Stop: 2023-12-25

## 2023-12-13 MED ADMIN — insulin lispro (HumaLOG) injection 10 Units: 10 [IU] | SUBCUTANEOUS | @ 19:00:00 | Stop: 2023-12-13

## 2023-12-13 MED ADMIN — PONATinib (ICLUSIG) tablet 30 mg *Patient Supplied Home Med*: 30 mg | ORAL | @ 01:00:00

## 2023-12-13 MED ADMIN — acetaminophen (TYLENOL) tablet 500 mg: 500 mg | ORAL | @ 11:00:00

## 2023-12-13 MED ADMIN — polyethylene glycol (MIRALAX) packet 17 g: 17 g | ORAL | @ 12:00:00

## 2023-12-13 MED ADMIN — insulin NPH (HumuLIN,NovoLIN) injection 22 Units: 22 [IU] | SUBCUTANEOUS | @ 01:00:00

## 2023-12-13 MED ADMIN — blinatumomab (BLINCYTO) 28 mcg/day in sodium chloride (non-PVC) 0.9 % IVPB (24-HR INFUSION): 28 ug/d | INTRAVENOUS | @ 22:00:00 | Stop: 2023-12-15

## 2023-12-13 MED ADMIN — insulin lispro (HumaLOG) injection 6 Units: 6 [IU] | SUBCUTANEOUS | @ 16:00:00 | Stop: 2023-12-13

## 2023-12-13 NOTE — Unmapped (Signed)
 Pt blinatumomab  running. Experiencing some chills, flushed sensation in face, low grade fever of 37.7C, and mild tremors in hands. Blina specific neuro check completed with no issues. Given tylenol  twice for headache. Hyperglycemic all day today, given sliding scale and nutritional lispro as well as a one time scheduled dose.     Problem: Adult Inpatient Plan of Care  Goal: Plan of Care Review  Outcome: Ongoing - Unchanged  Goal: Patient-Specific Goal (Individualized)  Outcome: Ongoing - Unchanged  Goal: Absence of Hospital-Acquired Illness or Injury  Outcome: Ongoing - Unchanged  Intervention: Identify and Manage Fall Risk  Recent Flowsheet Documentation  Taken 12/13/2023 0701 by Marlys Singh, RN  Safety Interventions:   fall reduction program maintained   lighting adjusted for tasks/safety   low bed   nonskid shoes/slippers when out of bed  Intervention: Prevent Skin Injury  Recent Flowsheet Documentation  Taken 12/13/2023 0701 by Marlys Singh, RN  Positioning for Skin: Sitting in Chair  Device Skin Pressure Protection: adhesive use limited  Skin Protection: adhesive use limited  Intervention: Prevent Infection  Recent Flowsheet Documentation  Taken 12/13/2023 0701 by Marlys Singh, RN  Infection Prevention:   cohorting utilized   environmental surveillance performed   equipment surfaces disinfected   hand hygiene promoted   personal protective equipment utilized   rest/sleep promoted   single patient room provided   visitors restricted/screened  Goal: Optimal Comfort and Wellbeing  Outcome: Ongoing - Unchanged  Goal: Readiness for Transition of Care  Outcome: Ongoing - Unchanged  Goal: Rounds/Family Conference  Outcome: Ongoing - Unchanged     Problem: Fall Injury Risk  Goal: Absence of Fall and Fall-Related Injury  Outcome: Ongoing - Unchanged  Intervention: Promote Injury-Free Environment  Recent Flowsheet Documentation  Taken 12/13/2023 0701 by Marlys Singh, RN  Safety Interventions:   fall reduction program maintained   lighting adjusted for tasks/safety   low bed   nonskid shoes/slippers when out of bed     Problem: Comorbidity Management  Goal: Maintenance of Asthma Control  Outcome: Ongoing - Unchanged  Goal: Maintenance of Behavioral Health Symptom Control  Outcome: Ongoing - Unchanged  Goal: Maintenance of COPD Symptom Control  Outcome: Ongoing - Unchanged  Goal: Blood Glucose Levels Within Targeted Range  Outcome: Ongoing - Unchanged  Goal: Maintenance of Heart Failure Symptom Control  Outcome: Ongoing - Unchanged  Goal: Blood Pressure in Desired Range  Outcome: Ongoing - Unchanged  Goal: Maintenance of Osteoarthritis Symptom Control  Outcome: Ongoing - Unchanged  Intervention: Maintain Osteoarthritis Symptom Control  Recent Flowsheet Documentation  Taken 12/13/2023 0701 by Marlys Singh, RN  Activity Management: up ad lib  Goal: Bariatric Home Regimen Maintained  Outcome: Ongoing - Unchanged  Goal: Maintenance of Seizure Control  Outcome: Ongoing - Unchanged     Problem: Pain Acute  Goal: Optimal Pain Control and Function  Outcome: Ongoing - Unchanged

## 2023-12-13 NOTE — Unmapped (Signed)
 Daily Progress Note    Principal Problem:    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)    Active Problems:    Blurred vision, bilateral    Hypertension    Headache    Metastasis to brain      Drug-induced injury of liver    Type 2 diabetes mellitus, with long-term current use of insulin       Hyperlipidemia     LOS: 19 days     Assessment/Plan: Patricia [name] is an 48 y.o. female, with a pertinent PMHx of Ph+ ALL, who presented with a 1 month hx of headaches. 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    #Headaches  #Ph+ ALL with CNS involvement  Hx Ph+ ALL previously in CR on maintenance vincristine /ponatinib . Presented after having a 1 month hx of headaches; workup confirmed CNS ALL relapse, for which biweekly IT MTX was started. LP with CSF studies were sent 5/12; on 5/13, CSF had undetectable blasts. As such, IT MTX dosing was reduced to weekly and blinatumomab  was started inpatient 5/13. Thus far, no s/sx c/f CRS/ICANS.  - Continue blinatumomab  for MRD tx per [facility] protocol. Monitor for CRS/ICANS (higher risk given CNS ALL).  - Continue LP with IT MTX every Monday  - Continue ponatinib  30 mg daily. Continue bASA daily iso ponatinib  use.  - Continue dapsone  100 mg daily, letermovir  480 mg daily, Valtrex  500 mg daily for ppx    #T2DM on insulin   Takes Lantus  10 u qHS at home, thought was titrating up insulin  regimen prior to this admission. Last A1c 7.1% in March 2025, c/w well-controlled T2DM. More elevated BG while inpatient in part due to dexamethasone  administration.  - Stop insulin  NPH in favor of Lantus . Start Lantus  48 u qHS.  - Continue SSI (ISF 30)  - Increase insulin  lispro to 6 u AC    #HTN  Hx primary HTN, takes losartan  50 mg daily at home.  - Continue home losartan  50 mg daily    #HLD  - Hold home rosuvastatin  10 mg qHS given mild transaminitis    Subjective:     Interval History: [name] and VSS. No acute changes from yesterday. No altered mentation, new headache, or neurological deficits c/f CRS/ICANS.     10 point ROS otherwise negative except as above in the HPI.     Objective:     Vital signs in last 24 hours:  Temp:  [36.6 ??C (97.9 ??F)-36.9 ??C (98.5 ??F)] 36.7 ??C (98.1 ??F)  Pulse:  [104-135] 104  Resp:  [16-18] 18  BP: (120-129)/(78-95) 124/80  MAP (mmHg):  [91-104] 94  SpO2:  [94 %-96 %] 96 %    Intake/Output last 3 shifts:  I/O last 3 completed shifts:  In: 354 [P.O.:354]  Out: 2 [Urine:2]    Meds:  Current Facility-Administered Medications   Medication Dose Route Frequency Provider Last Rate Last Admin    acetaminophen  (TYLENOL ) tablet 500 mg  500 mg Oral Q8H PRN Claria Crofts, MD   500 mg at 12/13/23 0726    albuterol  (PROVENTIL  HFA;VENTOLIN  HFA) 90 mcg/actuation inhaler 2 puff  2 puff Inhalation Q6H PRN Daphene Dys, MD        aspirin  chewable tablet 81 mg  81 mg Oral Daily Kitay, Ilona, MD   81 mg at 12/13/23 0809    bismuth  subsalicylate (PEPTO-BISMOL) oral suspension  30 mL Oral Q6H PRN Rudloff, Meredith Mells W, MD   30 mL at  11/25/23 1648    blinatumomab  (BLINCYTO ) 28 mcg/day in sodium chloride  (non-PVC) 0.9 % IVPB (24-HR INFUSION)  28 mcg/day Intravenous over 24 hr Painschab, Jeannie Milo, MD 10 mL/hr at 12/12/23 1742 32.5 mcg at 12/12/23 1742    cetirizine  (ZYRTEC ) tablet 10 mg  10 mg Oral Daily Rudloff, Jakaya Jacobowitz W, MD   10 mg at 12/13/23 0809    cyclobenzaprine  (FLEXERIL ) tablet 5 mg  5 mg Oral TID PRN Stone, Ellet C, DO        dapsone  tablet 100 mg  100 mg Oral Daily Daphene Dys, MD   100 mg at 12/13/23 1610    dexAMETHasone  (DECADRON ) 4 mg/mL injection 20 mg  20 mg Intravenous Once PRN Painschab, Jeannie Milo, MD        dextrose  50 % in water (D50W) 50 % solution 12.5 g  12.5 g Intravenous Q15 Min PRN Cookmeyer, Helga Loan, MD        diclofenac  sodium (VOLTAREN ) 1 % gel 4 g  4 g Topical QID PRN Stone, Ellet C, DO        diphenhydrAMINE  (BENADRYL ) injection 25 mg  25 mg Intravenous Q4H PRN Painschab, Jeannie Milo, MD        diphenhydrAMINE  (BENADRYL ) injection  25 mg Intravenous Q6H PRN Cornett, Fletcher Humble, MD   25 mg at 11/30/23 0858    EPINEPHrine (EPIPEN) injection 0.3 mg  0.3 mg Intramuscular Daily PRN Painschab, Jeannie Milo, MD        escitalopram  oxalate (LEXAPRO ) tablet 10 mg  10 mg Oral Daily Daphene Dys, MD   10 mg at 12/13/23 9604    famotidine  (PF) (PEPCID ) injection 20 mg  20 mg Intravenous Q4H PRN Painschab, Jeannie Milo, MD        glucagon injection 1 mg  1 mg Intramuscular Once PRN Cookmeyer, Helga Loan, MD        glucose chewable tablet 16 g  16 g Oral Q10 Min PRN Cookmeyer, Helga Loan, MD        insulin  glargine (LANTUS ) injection 48 Units  48 Units Subcutaneous Nightly Claria Crofts, MD        insulin  lispro (HumaLOG ) injection 0-6 Units  0-6 Units Subcutaneous ACHS Loletha Ripper, MD   6 Units at 12/13/23 1148    insulin  lispro (HumaLOG ) injection 6 Units  6 Units Subcutaneous 3xd Meals Claria Crofts, MD   6 Units at 12/13/23 1149    IP OKAY TO TREAT   Other Continuous PRN Painschab, Jeannie Milo, MD        labetalol  (NORMODYNE ) injection  10 mg Intravenous Q6H PRN Teressa Ferdinand, MD   10 mg at 11/29/23 1640    letermovir  (PREVYMIS ) tablet 480 mg  480 mg Oral Daily Daphene Dys, MD   480 mg at 12/12/23 2043    lidocaine  (ASPERCREME) 4 % 1 patch  1 patch Transdermal Daily PRN Stone, Ellet C, DO        losartan  (COZAAR ) tablet 25 mg  25 mg Oral Daily Duggan, Patrick, MD   25 mg at 12/13/23 0809    methylPREDNISolone  sodium succinate  (PF) (SOLU-Medrol ) injection 125 mg  125 mg Intravenous Q4H PRN Painschab, Jeannie Milo, MD        metoclopramide  (REGLAN ) injection 10 mg  10 mg Intravenous Q6H PRN Cornett, Fletcher Humble, MD   10 mg at 11/30/23 0858    polyethylene glycol (MIRALAX ) packet 17 g  17 g Oral BID Stone, Ellet C, DO   17  g at 12/13/23 0810    PONATinib  (ICLUSIG ) tablet 30 mg *Patient Supplied Home Med*  30 mg Oral Daily Painschab, Jeannie Milo, MD   30 mg at 12/12/23 2049    prochlorperazine  (COMPAZINE ) tablet 10 mg  10 mg Oral Q6H PRN Avery, Natalia, DO        Or    prochlorperazine  (COMPAZINE ) injection 10 mg  10 mg Intravenous Q6H PRN Avery, Natalia, DO   10 mg at 11/27/23 1847    psyllium (METAMUCIL) packet 1 packet  1 packet Oral Daily Zewdu, Abeba, MD   1 packet at 12/05/23 2841    sodium chloride  0.9% (NS) bolus 1,000 mL  1,000 mL Intravenous Daily PRN Painschab, Jeannie Milo, MD        valACYclovir  (VALTREX ) tablet 500 mg  500 mg Oral Daily Teressa Ferdinand, MD   500 mg at 12/12/23 2043     Physical Exam  Constitutional:       General: She is not in acute distress.     Appearance: She is obese. She is not ill-appearing, toxic-appearing or diaphoretic.   HENT:      Head: Normocephalic and atraumatic.      Nose: Nose normal.      Mouth/Throat:      Mouth: Mucous membranes are moist.   Eyes:      General: No scleral icterus.     Pupils: Pupils are equal, round, and reactive to light.   Cardiovascular:      Rate and Rhythm: Normal rate and regular rhythm.      Pulses: Normal pulses.      Heart sounds: Normal heart sounds.   Pulmonary:      Effort: Pulmonary effort is normal. No respiratory distress.   Abdominal:      Palpations: Abdomen is soft.      Tenderness: There is no abdominal tenderness.   Musculoskeletal:         General: No tenderness. Normal range of motion.      Cervical back: Normal range of motion.      Right lower leg: No edema.      Left lower leg: No edema.   Skin:     General: Skin is warm.      Capillary Refill: Capillary refill takes less than 2 seconds.   Neurological:      General: No focal deficit present.      Mental Status: She is alert and oriented to person, place, and time.      Sensory: No sensory deficit.      Motor: No weakness.   Psychiatric:         Mood and Affect: Mood normal.         Behavior: Behavior normal.       Labs:  Recent Labs     12/11/23  0509 12/12/23  0511 12/13/23  0609   WBC 6.2 8.1 10.5   NEUTROABS 4.2 6.4 10.2*   LYMPHSABS 1.2 1.1 0.2*   HGB 9.7* 9.3* 9.7*   HCT 27.8* 28.0* 27.8*   PLT 144* 149* 139* CREATININE 0.59 0.49* 0.55   BUN 17 19 21    BILITOT 0.8 0.6 0.7   AST 32 30 40*   ALT 80* 72* 92*   ALKPHOS 86 83 89   K 4.0 4.4 4.5   MG 1.9 2.0 1.9   CALCIUM  9.4 9.3 9.7   NA 140 140 138   CL 107 105 105   CO2 25.0 24.0 23.0  PHOS 4.0 3.3 3.1   INR 0.90 0.93 0.96   CRP 11.9*  --  28.5*     Imaging:  No interval images/scans    Claria Crofts, MD  12/13/2023  12:01 PM    Hematology/Oncology Department   [facility]   Group Pager: [phone]

## 2023-12-13 NOTE — Unmapped (Signed)
 Patient AOx4, VSS, afebrile. No falls or acute events this shift. Patient did not receive any PRN medications this shift. Continuous Blina running at 10 mL/hr. Plan of Care reviewed with patient.  Safety measures in place of bed low with brakes locked, non-skid footwear on while out of bed, call bell within reach.       Problem: Adult Inpatient Plan of Care  Goal: Plan of Care Review  Outcome: Ongoing - Unchanged  Goal: Patient-Specific Goal (Individualized)  Outcome: Ongoing - Unchanged  Goal: Absence of Hospital-Acquired Illness or Injury  Outcome: Ongoing - Unchanged  Intervention: Identify and Manage Fall Risk  Recent Flowsheet Documentation  Taken 12/12/2023 1925 by Elenora Griffiths, RN  Safety Interventions:   bleeding precautions   environmental modification   fall reduction program maintained   infection management   isolation precautions   lighting adjusted for tasks/safety   low bed   neutropenic precautions   nonskid shoes/slippers when out of bed  Intervention: Prevent Skin Injury  Recent Flowsheet Documentation  Taken 12/12/2023 1925 by Elenora Griffiths, RN  Positioning for Skin: Supine/Back  Device Skin Pressure Protection: adhesive use limited  Skin Protection:   adhesive use limited   transparent dressing maintained  Intervention: Prevent Infection  Recent Flowsheet Documentation  Taken 12/12/2023 1925 by Elenora Griffiths, RN  Infection Prevention:   cohorting utilized   environmental surveillance performed   equipment surfaces disinfected   personal protective equipment utilized   hand hygiene promoted   rest/sleep promoted   single patient room provided   visitors restricted/screened  Goal: Optimal Comfort and Wellbeing  Outcome: Ongoing - Unchanged  Goal: Readiness for Transition of Care  Outcome: Ongoing - Unchanged  Goal: Rounds/Family Conference  Outcome: Ongoing - Unchanged     Problem: Fall Injury Risk  Goal: Absence of Fall and Fall-Related Injury  Outcome: Ongoing - Unchanged  Intervention: Promote Injury-Free Environment  Recent Flowsheet Documentation  Taken 12/12/2023 1925 by Elenora Griffiths, RN  Safety Interventions:   bleeding precautions   environmental modification   fall reduction program maintained   infection management   isolation precautions   lighting adjusted for tasks/safety   low bed   neutropenic precautions   nonskid shoes/slippers when out of bed     Problem: Comorbidity Management  Goal: Maintenance of Asthma Control  Outcome: Ongoing - Unchanged  Goal: Maintenance of Behavioral Health Symptom Control  Outcome: Ongoing - Unchanged  Goal: Maintenance of COPD Symptom Control  Outcome: Ongoing - Unchanged  Goal: Blood Glucose Levels Within Targeted Range  Outcome: Ongoing - Unchanged  Goal: Maintenance of Heart Failure Symptom Control  Outcome: Ongoing - Unchanged  Goal: Blood Pressure in Desired Range  Outcome: Ongoing - Unchanged  Goal: Maintenance of Osteoarthritis Symptom Control  Outcome: Ongoing - Unchanged  Goal: Bariatric Home Regimen Maintained  Outcome: Ongoing - Unchanged  Goal: Maintenance of Seizure Control  Outcome: Ongoing - Unchanged     Problem: Pain Acute  Goal: Optimal Pain Control and Function  Outcome: Ongoing - Unchanged

## 2023-12-14 DIAGNOSIS — C9100 Acute lymphoblastic leukemia not having achieved remission: Principal | ICD-10-CM

## 2023-12-14 LAB — CBC W/ AUTO DIFF
BASOPHILS ABSOLUTE COUNT: 0 10*9/L (ref 0.0–0.1)
BASOPHILS RELATIVE PERCENT: 0.3 %
EOSINOPHILS ABSOLUTE COUNT: 0 10*9/L (ref 0.0–0.5)
EOSINOPHILS RELATIVE PERCENT: 0.7 %
HEMATOCRIT: 26.1 % — ABNORMAL LOW (ref 34.0–44.0)
HEMOGLOBIN: 9.4 g/dL — ABNORMAL LOW (ref 11.3–14.9)
LYMPHOCYTES ABSOLUTE COUNT: 1.1 10*9/L (ref 1.1–3.6)
LYMPHOCYTES RELATIVE PERCENT: 16.4 %
MEAN CORPUSCULAR HEMOGLOBIN CONC: 36.2 g/dL — ABNORMAL HIGH (ref 32.0–36.0)
MEAN CORPUSCULAR HEMOGLOBIN: 37 pg — ABNORMAL HIGH (ref 25.9–32.4)
MEAN CORPUSCULAR VOLUME: 102.2 fL — ABNORMAL HIGH (ref 77.6–95.7)
MEAN PLATELET VOLUME: 8.3 fL (ref 6.8–10.7)
MONOCYTES ABSOLUTE COUNT: 0.3 10*9/L (ref 0.3–0.8)
MONOCYTES RELATIVE PERCENT: 5.2 %
NEUTROPHILS ABSOLUTE COUNT: 5.1 10*9/L (ref 1.8–7.8)
NEUTROPHILS RELATIVE PERCENT: 77.4 %
PLATELET COUNT: 135 10*9/L — ABNORMAL LOW (ref 150–450)
RED BLOOD CELL COUNT: 2.55 10*12/L — ABNORMAL LOW (ref 3.95–5.13)
RED CELL DISTRIBUTION WIDTH: 19.1 % — ABNORMAL HIGH (ref 12.2–15.2)
WBC ADJUSTED: 6.6 10*9/L (ref 3.6–11.2)

## 2023-12-14 LAB — COMPREHENSIVE METABOLIC PANEL
ALBUMIN: 3.6 g/dL (ref 3.4–5.0)
ALKALINE PHOSPHATASE: 81 U/L (ref 46–116)
ALT (SGPT): 88 U/L — ABNORMAL HIGH (ref 10–49)
ANION GAP: 8 mmol/L (ref 5–14)
AST (SGOT): 37 U/L — ABNORMAL HIGH (ref <=34)
BILIRUBIN TOTAL: 0.8 mg/dL (ref 0.3–1.2)
BLOOD UREA NITROGEN: 17 mg/dL (ref 9–23)
BUN / CREAT RATIO: 27
CALCIUM: 9.2 mg/dL (ref 8.7–10.4)
CHLORIDE: 107 mmol/L (ref 98–107)
CO2: 27 mmol/L (ref 20.0–31.0)
CREATININE: 0.63 mg/dL (ref 0.55–1.02)
EGFR CKD-EPI (2021) FEMALE: >90 mL/min/1.73m2 (ref >=60)
GLUCOSE RANDOM: 84 mg/dL (ref 70–179)
POTASSIUM: 3.8 mmol/L (ref 3.4–4.8)
PROTEIN TOTAL: 6.4 g/dL (ref 5.7–8.2)
SODIUM: 142 mmol/L (ref 135–145)

## 2023-12-14 LAB — PROTIME-INR
INR: 0.93
PROTIME: 10.6 s (ref 9.9–12.6)

## 2023-12-14 LAB — MAGNESIUM: MAGNESIUM: 1.9 mg/dL (ref 1.6–2.6)

## 2023-12-14 LAB — PHOSPHORUS: PHOSPHORUS: 3.8 mg/dL (ref 2.4–5.1)

## 2023-12-14 LAB — C-REACTIVE PROTEIN: C-REACTIVE PROTEIN: 33.5 mg/L — ABNORMAL HIGH (ref <=10.0)

## 2023-12-14 MED ADMIN — valACYclovir (VALTREX) tablet 500 mg: 500 mg | ORAL | @ 01:00:00 | Stop: 2023-12-24

## 2023-12-14 MED ADMIN — acetaminophen (TYLENOL) tablet 500 mg: 500 mg | ORAL | @ 22:00:00

## 2023-12-14 MED ADMIN — aspirin chewable tablet 81 mg: 81 mg | ORAL | @ 13:00:00

## 2023-12-14 MED ADMIN — dapsone tablet 100 mg: 100 mg | ORAL | @ 13:00:00 | Stop: 2023-12-25

## 2023-12-14 MED ADMIN — losartan (COZAAR) tablet 25 mg: 25 mg | ORAL | @ 13:00:00

## 2023-12-14 MED ADMIN — insulin glargine (LANTUS) injection 48 Units: 48 [IU] | SUBCUTANEOUS | @ 01:00:00

## 2023-12-14 MED ADMIN — letermovir (PREVYMIS) tablet 480 mg: 480 mg | ORAL | @ 01:00:00 | Stop: 2023-12-25

## 2023-12-14 MED ADMIN — acetaminophen (TYLENOL) tablet 500 mg: 500 mg | ORAL | @ 13:00:00

## 2023-12-14 MED ADMIN — insulin lispro (HumaLOG) injection 0-6 Units: 0-6 [IU] | SUBCUTANEOUS | @ 01:00:00

## 2023-12-14 MED ADMIN — cetirizine (ZYRTEC) tablet 10 mg: 10 mg | ORAL | @ 13:00:00

## 2023-12-14 MED ADMIN — escitalopram oxalate (LEXAPRO) tablet 10 mg: 10 mg | ORAL | @ 13:00:00 | Stop: 2023-12-25

## 2023-12-14 MED ADMIN — blinatumomab (BLINCYTO) 28 mcg/day in sodium chloride (non-PVC) 0.9 % IVPB (24-HR INFUSION): 28 ug/d | INTRAVENOUS | @ 22:00:00 | Stop: 2023-12-15

## 2023-12-14 MED ADMIN — PONATinib (ICLUSIG) tablet 30 mg *Patient Supplied Home Med*: 30 mg | ORAL | @ 01:00:00

## 2023-12-14 NOTE — Unmapped (Signed)
 Hem Onc Scheduled Admission Through Infusion    Hello,    Please schedule Patricia [name] on 6/26 for a SCHED ADM THROUGH INF.     Admission Details:   Reason for Admission: Scheduled chemotherapy   Regimen: IP/OP Leukemia Blinatumomab   Primary Diagnosis:  Encounter for antineoplastic chemotherapy for Ph+ ALL  Primary Diagnosis ICD-10 Code:  Z51.11 & C91.00  Expected length of stay: 2 days   CPT Code:  16109   CPT Code Description: Under Injection and Intravenous Infusion Chemotherapy and Other Highly Complex Drug or Highly Complex Biologic Agent Administration   Inpatient Treating Attending: Dr. Eusebio High  Need for PICC placement in Infusion? No  Central Line Access? Yes      Discharge Planning Assessment:  Address: [address], [place], [place]   Transportation Barriers: No  If yes please elaborate: N/A  Is social work already involved:  Yes  CVAD Line Care Plan: Yes  Local Oncologist? No      Acute Leukemia Discharge Planning:  Lab and Transfusion Support (Frequency/Duration, [place] or local?)  Blina bag change 6/28  Bag change 6/30  Bag change 7/3  Bag change 7/10  Bag change 7/17  Bag disconnect  7/24  Supportive Care Needs (GCSF, EKG, [place], LP/IT chemo)  IT chemo 7/24 in infusion  IT chemo 7/31 in infusion  Follow up Provider    Other Pertinent Information: N/A    Thank you,  Katheren Palomino, RN

## 2023-12-14 NOTE — Unmapped (Signed)
 VSS. ICE 10/10. PRN tylenol  x2 for HA. Mild intermittent tremors in bilateral hands; slightly worse in L hand. No insulin  required during dayshift. Changed chemo bag. X1 BM. Safety maintained.     Problem: Adult Inpatient Plan of Care  Goal: Plan of Care Review  Outcome: Ongoing - Unchanged  Goal: Patient-Specific Goal (Individualized)  Outcome: Ongoing - Unchanged  Goal: Absence of Hospital-Acquired Illness or Injury  Outcome: Ongoing - Unchanged  Intervention: Identify and Manage Fall Risk  Recent Flowsheet Documentation  Taken 12/14/2023 0710 by Kary Pages, RN  Safety Interventions:   bleeding precautions   chemotherapeutic agent precautions   environmental modification   family at bedside   infection management   isolation precautions   lighting adjusted for tasks/safety   low bed   neutropenic precautions   nonskid shoes/slippers when out of bed  Intervention: Prevent Skin Injury  Recent Flowsheet Documentation  Taken 12/14/2023 0710 by Kary Pages, RN  Positioning for Skin: Supine/Back  Device Skin Pressure Protection: adhesive use limited  Skin Protection: adhesive use limited  Intervention: Prevent Infection  Recent Flowsheet Documentation  Taken 12/14/2023 0710 by Kary Pages, RN  Infection Prevention:   cohorting utilized   environmental surveillance performed   equipment surfaces disinfected   hand hygiene promoted   personal protective equipment utilized   rest/sleep promoted   single patient room provided   visitors restricted/screened  Goal: Optimal Comfort and Wellbeing  Outcome: Ongoing - Unchanged  Goal: Readiness for Transition of Care  Outcome: Ongoing - Unchanged  Goal: Rounds/Family Conference  Outcome: Ongoing - Unchanged     Problem: Fall Injury Risk  Goal: Absence of Fall and Fall-Related Injury  Outcome: Ongoing - Unchanged  Intervention: Promote Injury-Free Environment  Recent Flowsheet Documentation  Taken 12/14/2023 0710 by Kary Pages, RN  Safety Interventions:   bleeding precautions   chemotherapeutic agent precautions   environmental modification   family at bedside   infection management   isolation precautions   lighting adjusted for tasks/safety   low bed   neutropenic precautions   nonskid shoes/slippers when out of bed

## 2023-12-14 NOTE — Unmapped (Addendum)
 Patient's disability claim paperwork completed and returned to patient. DMV handicap placard completed and returned to patient.     Patient's husband Patricia [name]) FMLA paperwork completed and returned to [employer] with ABSS.     Confirmation received  The message you sent to [email or web address] at [phone], was delivered successfully on 12/14/2023 at 4:07:12 PM    JobID: [id number]

## 2023-12-14 NOTE — Unmapped (Signed)
 Daily Progress Note    Principal Problem:    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)    Active Problems:    Hypertension    Headache    Drug-induced injury of liver    Type 2 diabetes mellitus, with long-term current use of insulin       Hyperlipidemia     LOS: 20 days     Assessment/Plan: Patricia [name] is an 48 y.o. female, with a pertinent PMHx of Ph+ ALL, who presented with a 1 month hx of headaches. 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    #Headaches (stable)  #Ph+ ALL with CNS involvement  Hx Ph+ ALL previously in CR on maintenance vincristine /ponatinib . Presented after having a 1 month hx of headaches; workup confirmed CNS ALL relapse, for which biweekly IT MTX was started. LP with CSF studies were sent 5/12; on 5/13, CSF had undetectable blasts. As such, IT MTX dosing was reduced to weekly and blinatumomab  was started inpatient 5/13. Thus far, mild hand tremors for which neurotoxicity may be etiology. ICE score 9 c/w ICANS 1, will continue to monitor at this time.  - Continue blinatumomab  for MRD tx per [facility] protocol. Monitor for CRS/ICANS (higher risk given CNS ALL).  - Continue LP with IT MTX every Monday  - Continue ponatinib  30 mg daily. Continue bASA daily iso ponatinib  use.  - Continue dapsone  100 mg daily, letermovir  480 mg daily, Valtrex  500 mg daily for ppx    #T2DM on insulin   Takes Lantus  10 u qHS at home, thought was titrating up insulin  regimen prior to this admission. Last A1c 7.1% in March 2025, c/w well-controlled T2DM. More elevated BG while inpatient in part due to dexamethasone  administration.  - Decrease Lantus  to 40 u qHS  - Continue SSI (ISF 30)  - Decrease insulin  lispro to 6 u AC (from 10 u AC)    #HTN  Hx primary HTN, takes losartan  50 mg daily at home.  - Continue home losartan  50 mg daily    #HLD  - Hold home rosuvastatin  10 mg qHS given mild transaminitis    Subjective:     Interval History: [name] and VSS. Developed hand tremors 5/14 PM that are stable/slightly improved this AM. No altered mentation, new headache, or other neurological deficits c/f worsening CRS/ICANS.    10 point ROS otherwise negative except as above in the HPI.     Objective:     Vital signs in last 24 hours:  Temp:  [36.7 ??C (98.1 ??F)-37.7 ??C (99.8 ??F)] 36.7 ??C (98.1 ??F)  Pulse:  [104-127] 112  Resp:  [16-18] 18  BP: (119-134)/(80-97) 119/86  MAP (mmHg):  [92-107] 98  SpO2:  [94 %-100 %] 94 %    Intake/Output last 3 shifts:  No intake/output data recorded.    Meds:  Current Facility-Administered Medications   Medication Dose Route Frequency Provider Last Rate Last Admin    acetaminophen  (TYLENOL ) tablet 500 mg  500 mg Oral Q8H PRN Claria Crofts, MD   500 mg at 12/14/23 1610    albuterol  (PROVENTIL  HFA;VENTOLIN  HFA) 90 mcg/actuation inhaler 2 puff  2 puff Inhalation Q6H PRN Daphene Dys, MD        aspirin  chewable tablet 81 mg  81 mg Oral Daily Kitay, Ilona, MD   81 mg at 12/14/23 0915    bismuth  subsalicylate (PEPTO-BISMOL) oral suspension  30 mL Oral Q6H PRN Rudloff, Nava Song W, MD   30 mL  at 11/25/23 1648    blinatumomab  (BLINCYTO ) 28 mcg/day in sodium chloride  (non-PVC) 0.9 % IVPB (24-HR INFUSION)  28 mcg/day Intravenous over 24 hr Painschab, Jeannie Milo, MD 10 mL/hr at 12/13/23 1738 32.5 mcg at 12/13/23 1738    cetirizine  (ZYRTEC ) tablet 10 mg  10 mg Oral Daily Rudloff, Trecia Maring W, MD   10 mg at 12/14/23 0915    dapsone  tablet 100 mg  100 mg Oral Daily Daphene Dys, MD   100 mg at 12/14/23 1324    dexAMETHasone  (DECADRON ) 4 mg/mL injection 20 mg  20 mg Intravenous Once PRN Painschab, Jeannie Milo, MD        dextrose  50 % in water (D50W) 50 % solution 12.5 g  12.5 g Intravenous Q15 Min PRN Cookmeyer, Helga Loan, MD        diclofenac  sodium (VOLTAREN ) 1 % gel 4 g  4 g Topical QID PRN Stone, Ellet C, DO        diphenhydrAMINE  (BENADRYL ) injection 25 mg  25 mg Intravenous Q4H PRN Painschab, Jeannie Milo, MD        diphenhydrAMINE  (BENADRYL ) injection  25 mg Intravenous Q6H PRN Cornett, Fletcher Humble, MD   25 mg at 11/30/23 0858    EPINEPHrine (EPIPEN) injection 0.3 mg  0.3 mg Intramuscular Daily PRN Painschab, Jeannie Milo, MD        escitalopram  oxalate (LEXAPRO ) tablet 10 mg  10 mg Oral Daily Daphene Dys, MD   10 mg at 12/14/23 0915    famotidine  (PF) (PEPCID ) injection 20 mg  20 mg Intravenous Q4H PRN Painschab, Jeannie Milo, MD        glucagon injection 1 mg  1 mg Intramuscular Once PRN Cookmeyer, Helga Loan, MD        glucose chewable tablet 16 g  16 g Oral Q10 Min PRN Cookmeyer, Helga Loan, MD        insulin  glargine (LANTUS ) injection 40 Units  40 Units Subcutaneous Nightly Claria Crofts, MD        insulin  lispro (HumaLOG ) injection 0-6 Units  0-6 Units Subcutaneous ACHS Loletha Ripper, MD   2 Units at 12/13/23 2105    insulin  lispro (HumaLOG ) injection 6 Units  6 Units Subcutaneous 3xd Meals Claria Crofts, MD        IP OKAY TO TREAT   Other Continuous PRN Painschab, Jeannie Milo, MD        letermovir  (PREVYMIS ) tablet 480 mg  480 mg Oral Daily Daphene Dys, MD   480 mg at 12/13/23 2105    lidocaine  (ASPERCREME) 4 % 1 patch  1 patch Transdermal Daily PRN Stone, Ellet C, DO        losartan  (COZAAR ) tablet 25 mg  25 mg Oral Daily Duggan, Patrick, MD   25 mg at 12/14/23 0915    methylPREDNISolone  sodium succinate  (PF) (SOLU-Medrol ) injection 125 mg  125 mg Intravenous Q4H PRN Painschab, Jeannie Milo, MD        metoclopramide  (REGLAN ) injection 10 mg  10 mg Intravenous Q6H PRN Cornett, Fletcher Humble, MD   10 mg at 11/30/23 0858    polyethylene glycol (MIRALAX ) packet 17 g  17 g Oral BID Stone, Ellet C, DO   17 g at 12/13/23 0810    PONATinib  (ICLUSIG ) tablet 30 mg *Patient Supplied Home Med*  30 mg Oral Daily Painschab, Jeannie Milo, MD   30 mg at 12/13/23 2106    prochlorperazine  (COMPAZINE ) tablet 10 mg  10 mg Oral Q6H PRN Dave Erie,  Natalia, DO        Or    prochlorperazine  (COMPAZINE ) injection 10 mg  10 mg Intravenous Q6H PRN Avery, Natalia, DO   10 mg at 11/27/23 1847    psyllium (METAMUCIL) packet 1 packet  1 packet Oral Daily Zewdu, Abeba, MD   1 packet at 12/05/23 0858    sodium chloride  0.9% (NS) bolus 1,000 mL  1,000 mL Intravenous Daily PRN Painschab, Jeannie Milo, MD        valACYclovir  (VALTREX ) tablet 500 mg  500 mg Oral Daily Teressa Ferdinand, MD   500 mg at 12/13/23 2105     Physical Exam  Constitutional:       General: She is not in acute distress.     Appearance: She is obese. She is not ill-appearing, toxic-appearing or diaphoretic.   HENT:      Head: Normocephalic and atraumatic.      Nose: Nose normal.      Mouth/Throat:      Mouth: Mucous membranes are moist.   Eyes:      General: No scleral icterus.     Pupils: Pupils are equal, round, and reactive to light.   Cardiovascular:      Rate and Rhythm: Normal rate and regular rhythm.      Pulses: Normal pulses.      Heart sounds: Normal heart sounds.   Pulmonary:      Effort: Pulmonary effort is normal. No respiratory distress.   Abdominal:      Palpations: Abdomen is soft.      Tenderness: There is no abdominal tenderness.   Musculoskeletal:         General: No tenderness. Normal range of motion.      Cervical back: Normal range of motion.      Right lower leg: No edema.      Left lower leg: No edema.   Skin:     General: Skin is warm.      Capillary Refill: Capillary refill takes less than 2 seconds.   Neurological:      General: No focal deficit present.      Mental Status: She is alert and oriented to person, place, and time.      Sensory: No sensory deficit.      Motor: No weakness.   Psychiatric:         Mood and Affect: Mood normal.         Behavior: Behavior normal.       Labs:  Recent Labs     12/12/23  0511 12/13/23  0609 12/14/23  0440   WBC 8.1 10.5 6.6   NEUTROABS 6.4 10.2* 5.1   LYMPHSABS 1.1 0.2* 1.1   HGB 9.3* 9.7* 9.4*   HCT 28.0* 27.8* 26.1*   PLT 149* 139* 135*   CREATININE 0.49* 0.55 0.63   BUN 19 21 17    BILITOT 0.6 0.7 0.8   AST 30 40* 37*   ALT 72* 92* 88*   ALKPHOS 83 89 81   K 4.4 4.5 3.8   MG 2.0 1.9 1.9 CALCIUM  9.3 9.7 9.2   NA 140 138 142   CL 105 105 107   CO2 24.0 23.0 27.0   PHOS 3.3 3.1 3.8   INR 0.93 0.96 0.93   CRP  --  28.5* 33.5*     Imaging:  No interval images/scans    Claria Crofts, MD  12/14/2023  10:35 AM    Hematology/Oncology Department   [facility]  Healthcare   [name] Pager: [phone]

## 2023-12-14 NOTE — Unmapped (Signed)
 Patient AOx4, VSS, afebrile. 2 Units of correctional insulin  given. Blinatumomab  running. ICE sentence completed. Plan of Care reviewed with patient.  Safety measures in place of bed low with brakes locked, non-skid footwear on while out of bed, call bell within reach.       Problem: Adult Inpatient Plan of Care  Goal: Plan of Care Review  Outcome: Ongoing - Unchanged  Goal: Patient-Specific Goal (Individualized)  Outcome: Ongoing - Unchanged  Goal: Absence of Hospital-Acquired Illness or Injury  Outcome: Ongoing - Unchanged  Goal: Optimal Comfort and Wellbeing  Outcome: Ongoing - Unchanged  Goal: Readiness for Transition of Care  Outcome: Ongoing - Unchanged  Goal: Rounds/Family Conference  Outcome: Ongoing - Unchanged     Problem: Fall Injury Risk  Goal: Absence of Fall and Fall-Related Injury  Outcome: Ongoing - Unchanged     Problem: Comorbidity Management  Goal: Maintenance of Asthma Control  Outcome: Ongoing - Unchanged  Goal: Maintenance of Behavioral Health Symptom Control  Outcome: Ongoing - Unchanged  Goal: Maintenance of COPD Symptom Control  Outcome: Ongoing - Unchanged  Goal: Blood Glucose Levels Within Targeted Range  Outcome: Ongoing - Unchanged  Goal: Maintenance of Heart Failure Symptom Control  Outcome: Ongoing - Unchanged  Goal: Blood Pressure in Desired Range  Outcome: Ongoing - Unchanged  Goal: Maintenance of Osteoarthritis Symptom Control  Outcome: Ongoing - Unchanged  Goal: Bariatric Home Regimen Maintained  Outcome: Ongoing - Unchanged  Goal: Maintenance of Seizure Control  Outcome: Ongoing - Unchanged     Problem: Pain Acute  Goal: Optimal Pain Control and Function  Outcome: Ongoing - Unchanged

## 2023-12-15 ENCOUNTER — Ambulatory Visit: Admit: 2023-12-15 | Discharge: 2023-12-16 | Payer: PRIVATE HEALTH INSURANCE

## 2023-12-15 ENCOUNTER — Inpatient Hospital Stay: Admit: 2023-12-15 | Discharge: 2023-12-16 | Payer: PRIVATE HEALTH INSURANCE

## 2023-12-15 DIAGNOSIS — C9100 Acute lymphoblastic leukemia not having achieved remission: Principal | ICD-10-CM

## 2023-12-15 LAB — COMPREHENSIVE METABOLIC PANEL
ALBUMIN: 3.8 g/dL (ref 3.4–5.0)
ALKALINE PHOSPHATASE: 81 U/L (ref 46–116)
ALT (SGPT): 90 U/L — ABNORMAL HIGH (ref 10–49)
ANION GAP: 9 mmol/L (ref 5–14)
AST (SGOT): 32 U/L (ref <=34)
BILIRUBIN TOTAL: 0.7 mg/dL (ref 0.3–1.2)
BLOOD UREA NITROGEN: 16 mg/dL (ref 9–23)
BUN / CREAT RATIO: 28
CALCIUM: 9.4 mg/dL (ref 8.7–10.4)
CHLORIDE: 106 mmol/L (ref 98–107)
CO2: 28 mmol/L (ref 20.0–31.0)
CREATININE: 0.58 mg/dL (ref 0.55–1.02)
EGFR CKD-EPI (2021) FEMALE: >90 mL/min/1.73m2 (ref >=60)
GLUCOSE RANDOM: 133 mg/dL (ref 70–179)
POTASSIUM: 4 mmol/L (ref 3.4–4.8)
PROTEIN TOTAL: 6.7 g/dL (ref 5.7–8.2)
SODIUM: 143 mmol/L (ref 135–145)

## 2023-12-15 LAB — CBC W/ AUTO DIFF
BASOPHILS ABSOLUTE COUNT: 0 10*9/L (ref 0.0–0.1)
BASOPHILS RELATIVE PERCENT: 0.4 %
EOSINOPHILS ABSOLUTE COUNT: 0.1 10*9/L (ref 0.0–0.5)
EOSINOPHILS RELATIVE PERCENT: 1.8 %
HEMATOCRIT: 28.1 % — ABNORMAL LOW (ref 34.0–44.0)
HEMOGLOBIN: 9.8 g/dL — ABNORMAL LOW (ref 11.3–14.9)
LYMPHOCYTES ABSOLUTE COUNT: 1.2 10*9/L (ref 1.1–3.6)
LYMPHOCYTES RELATIVE PERCENT: 19.5 %
MEAN CORPUSCULAR HEMOGLOBIN CONC: 34.9 g/dL (ref 32.0–36.0)
MEAN CORPUSCULAR HEMOGLOBIN: 35.9 pg — ABNORMAL HIGH (ref 25.9–32.4)
MEAN CORPUSCULAR VOLUME: 102.8 fL — ABNORMAL HIGH (ref 77.6–95.7)
MEAN PLATELET VOLUME: 8.5 fL (ref 6.8–10.7)
MONOCYTES ABSOLUTE COUNT: 0.3 10*9/L (ref 0.3–0.8)
MONOCYTES RELATIVE PERCENT: 5.1 %
NEUTROPHILS ABSOLUTE COUNT: 4.6 10*9/L (ref 1.8–7.8)
NEUTROPHILS RELATIVE PERCENT: 73.2 %
PLATELET COUNT: 144 10*9/L — ABNORMAL LOW (ref 150–450)
RED BLOOD CELL COUNT: 2.74 10*12/L — ABNORMAL LOW (ref 3.95–5.13)
RED CELL DISTRIBUTION WIDTH: 23.2 % — ABNORMAL HIGH (ref 12.2–15.2)
WBC ADJUSTED: 6.3 10*9/L (ref 3.6–11.2)

## 2023-12-15 LAB — PROTIME-INR
INR: 0.96
PROTIME: 10.9 s (ref 9.9–12.6)

## 2023-12-15 LAB — C-REACTIVE PROTEIN: C-REACTIVE PROTEIN: 14.3 mg/L — ABNORMAL HIGH (ref <=10.0)

## 2023-12-15 LAB — MAGNESIUM: MAGNESIUM: 1.9 mg/dL (ref 1.6–2.6)

## 2023-12-15 LAB — SLIDE REVIEW

## 2023-12-15 LAB — PHOSPHORUS: PHOSPHORUS: 4.2 mg/dL (ref 2.4–5.1)

## 2023-12-15 MED ORDER — ACETAMINOPHEN ER 650 MG TABLET,EXTENDED RELEASE
ORAL_TABLET | Freq: Three times a day (TID) | ORAL | 3 refills | 10.00000 days | Status: CN | PRN
Start: 2023-12-15 — End: ?

## 2023-12-15 MED ORDER — INSULIN GLARGINE (U-100) 100 UNIT/ML SUBCUTANEOUS SOLUTION
Freq: Every evening | SUBCUTANEOUS | 0 refills | 30.00000 days | Status: CN
Start: 2023-12-15 — End: 2024-01-14

## 2023-12-15 MED ADMIN — PONATinib (ICLUSIG) tablet 30 mg *Patient Supplied Home Med*: 30 mg | ORAL | @ 01:00:00

## 2023-12-15 MED ADMIN — cetirizine (ZYRTEC) tablet 10 mg: 10 mg | ORAL | @ 12:00:00 | Stop: 2023-12-15

## 2023-12-15 MED ADMIN — escitalopram oxalate (LEXAPRO) tablet 10 mg: 10 mg | ORAL | @ 12:00:00 | Stop: 2023-12-15

## 2023-12-15 MED ADMIN — insulin lispro (HumaLOG) injection 0-6 Units: 0-6 [IU] | SUBCUTANEOUS | @ 16:00:00 | Stop: 2023-12-15

## 2023-12-15 MED ADMIN — letermovir (PREVYMIS) tablet 480 mg: 480 mg | ORAL | @ 01:00:00 | Stop: 2023-12-25

## 2023-12-15 MED ADMIN — insulin lispro (HumaLOG) injection 0-6 Units: 0-6 [IU] | SUBCUTANEOUS | @ 03:00:00

## 2023-12-15 MED ADMIN — acetaminophen (TYLENOL) tablet 500 mg: 500 mg | ORAL | @ 10:00:00 | Stop: 2023-12-15

## 2023-12-15 MED ADMIN — insulin lispro (HumaLOG) injection 0-6 Units: 0-6 [IU] | SUBCUTANEOUS | @ 12:00:00 | Stop: 2023-12-15

## 2023-12-15 MED ADMIN — blinatumomab (BLINCYTO) 28 mcg/day in sodium chloride (non-PVC) 0.9 % IVPB (72-HR CADD INFUSION): 28 ug/d | INTRAVENOUS | @ 17:00:00

## 2023-12-15 MED ADMIN — dapsone tablet 100 mg: 100 mg | ORAL | @ 12:00:00 | Stop: 2023-12-15

## 2023-12-15 MED ADMIN — insulin glargine (LANTUS) injection 30 Units: 30 [IU] | SUBCUTANEOUS | @ 03:00:00

## 2023-12-15 MED ADMIN — valACYclovir (VALTREX) tablet 500 mg: 500 mg | ORAL | @ 01:00:00 | Stop: 2023-12-24

## 2023-12-15 MED ADMIN — aspirin chewable tablet 81 mg: 81 mg | ORAL | @ 12:00:00 | Stop: 2023-12-15

## 2023-12-15 NOTE — Unmapped (Signed)
No results displayed because visit has over 200 results.

## 2023-12-15 NOTE — Unmapped (Signed)
 Physician Discharge Summary [facility]  [facility]  [address]  [place] [address]  Dept: [phone]  Loc: [phone]     Identifying Information:   Patricia [name]  [birth year]  [id number]    Primary Care Physician: Tanya Fantasia, MD     Referring Physician: Humphrey Magnuson Zeidner     Code Status: Full Code    Admit Date: 11/24/2023    Discharge Date: 12/15/2023     Discharge To: Home    Discharge Service: [facility] - Hematology Res Floor Team (MED E - Tower)     Discharge Attending Physician: Lamond Pilot, MD    Discharge Diagnoses:  Principal Problem:    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)    Active Problems:    Hypertension    Headache    Type 2 diabetes mellitus, with long-term current use of insulin       Hyperlipidemia    Outpatient Provider Follow Up Issues:   Follow-up Plan after discharge:  Issues related to hospitalization: initiation of blinatumomab  after undetectable blasts in CSF   Follow-up appointment for lab draws: 12/18/2023 11:15 AM  Follow-up appointment with [facility]: 12/28/2023 1:40 PM with Twyla Galeazzi, FNP   Oncology specific plans going forward: outpatient blinatumomab     Patient's primary oncologist and/or nurse navigator: Dr. Wayland Haggis   Warm handoff via Epic message or direct conversation?: Yes    Supportive Care Recommendations:  We recommend based on the patient???s underlying diagnosis and treatment history the following supportive care:    1. Antimicrobial prophylaxis: ALL (not in remission):  - Bacterial: levofloxacin  if ANC < 0.5  - Fungal: fluconazole  if ANC < 0.5  - Viral: letermovir  480 mg daily, Valtrex  500 mg daily  - PJP ppx: dapsone  100 mg daily    2. Blood product support:  Leukoreduced blood products are required.  Irradiated blood products are preferred, but in case of urgent transfusion needs non-irradiated blood products may be used:   -  RBC transfusion threshold: transfuse 1 units for Hgb < 7 g/dL.  - Platelet transfusion threshold: transfuse 1 unit of platelets for platelet count < 10, or for bleeding or need for invasive procedure.    Based on the patient's disease status and intensity of therapy, complete blood count with differential should be evaluated 1 times per week and used to guide transfusion support.    3. Hematopoietic growth factor support: none    Hospital Course:   Patricia [name] is an 48 y.o. female, with a pertinent PMHx of Ph+ ALL, who presented with a 1 month hx of headaches. 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 Her CSF on 5/12 was negative for blasts; as such, blinatumomab  for MRD was started 5/13. She tolerated blinatumomab  infusions well (only side effect being mild tremors) and was discharged with plans for outpatient blinatumomab  infusions.    #Headaches  #Ph+ B-ALL with CNS involvement  Hx Ph+ B-ALL previously in CR on maintenance vincristine /ponatinib . Follows with Dr. Wayna Hails. Presented after having a 1 month hx of bilateral, worsening headaches with visual changes but no focal neurological deficits. Workup, which included MRI (showing hyperintensities c/w leukemic involvement) and LP cx, confirmed CNS Ph+ B-ALL relapse and ruled out infection. For Ph+ B-ALL with CNS involvement (79% blasts), biweekly IT MTX was started. First dose of IT MTX was given 4/28. Repeat LP with CSF studies were sent 5/12; on 5/13, CSF had undetectable blasts. As  such, IT MTX dosing was reduced from twice weekly to once weekly and blinatumomab  was started inpatient 5/13. Only side effect from blinatumomab  was mild tremors that were improving at time of discharge.  - Continue blinatumomab  for MRD as an outpatient  - Continue LP with IT MTX every Monday  - Continue ponatinib  30 mg daily and bASA daily iso ponatinib  use  - Continue dapsone  100 mg daily, letermovir  480 mg daily, Valtrex  500 mg daily for ppx     #Transaminitis (resolved)  #DILI iso heavy Excedrin use (resolved)  On admission, transaminitis with AST 752, ALT 1663, and ALP mildly elevated. Per R factor, injury c/w hepatocellular injury. Was taking heavy doses of Excedrin for headaches, which is most likely explanation for transaminitis. Other workup ruled out CMV viremia (PCR negative), veno-occlusive leukemic infiltration, and portal vein thrombosis (Liver Doppler U/S negative). Hepatology was consulted and deemed likely etiology to be Excedrin/Tylenol  overdose. Transaminitis resolved with gentle Tylenol  PRN for pain at time of admission.    #Neck pain, chronic  CT C-spine at OSH showed degenerative disc disease at C5-C6 with no spinal cord compression. No acute changes during this admission; low c/f meningitis based on lack of nuchal rigidity, lack of focal neurological deficits, and lack of infectious stigmata. Managed conservatively during this admission with Tylenol , Voltaren  gel, and ice packs.    #T2DM on insulin   Newly dx in April 2025, was beginning to uptitrate insulin  regimen. Last A1c in March 2025 c/w T2DM. At time of admission, was using CGM and Lantus  10 u qHS at home. BG were significantly elevated during this admission, in large part due to dexamethasone  administration for management of Ph+ ALL. At time of discharge, inpatient insulin  regimen included Lantus  30 u qHS and SSI (ISF 30).  - Continue Lantus  30 u qHS, SSI, and BG checks ACHS  - Titrate insulin  as outpatient. Will likely need bolus injections with meals.     #HTN  Hx primary HTN, was taking losartan  50 mg daily at home. BP normotensive with reduced losartan  dose during this admission.  - Continue losartan  25 mg daily, uptitrate as appropriate     #HLD  Held home rosuvastatin  10 mg qHS due to transaminitis iso heavy Excedrin use. Recommend outpatient follow-up of LFTs and reinitiation of rosuvastatin  if appropriate (i.e., normal LFTs).  Procedures:  lumbar puncture  No admission procedures for hospital encounter.  ______________________________________________________________________  Discharge Medications:     Your Medication List        PAUSE taking these medications      rosuvastatin  10 MG tablet  Wait to take this until your doctor or other care provider tells you to start again.  Commonly known as: CRESTOR   Take 1 tablet (10 mg total) by mouth nightly.            STOP taking these medications      gabapentin 300 MG capsule  Commonly known as: NEURONTIN     levalbuterol  0.31 mg/3 mL nebulizer solution  Commonly known as: XOPENEX             CHANGE how you take these medications      LANTUS  SOLOSTAR U-100 INSULIN  100 unit/mL (3 mL) injection pen  Generic drug: insulin  glargine  Inject 0.3 mL (30 Units total) under the skin nightly.  What changed: Another medication with the same name was removed. Continue taking this medication, and follow the directions you see here.            CONTINUE  taking these medications      ACCU-CHEK GUIDE GLUCOSE METER Misc  Generic drug: blood-glucose meter  Use to check blood sugar two times a day. Use as instructed.     ACCU-CHEK GUIDE TEST STRIPS Strp  Generic drug: blood sugar diagnostic  Use to check blood sugar two times a day.     ACCU-CHEK SOFTCLIX LANCETS lancets  Generic drug: lancets  Use to check blood sugar as directed 2 times a day & for symptoms of high or low blood sugar.     acetaminophen  650 MG CR tablet  Commonly known as: TYLENOL  8 HOUR  Take 2 tablets (1,300 mg total) by mouth every eight (8) hours as needed for pain.     albuterol  90 mcg/actuation inhaler  Commonly known as: PROVENTIL  HFA;VENTOLIN  HFA  Inhale 2 puffs every six (6) hours as needed for wheezing.     aspirin  81 MG tablet  Commonly known as: ECOTRIN  Take 1 tablet (81 mg total) by mouth daily.     cetirizine  10 MG tablet  Commonly known as: ZYRTEC   Take 1 tablet (10 mg total) by mouth nightly.     dapsone  100 MG tablet  Take 1 tablet (100 mg total) by mouth daily. TAKE 1 TABLET(100 MG) BY MOUTH DAILY     escitalopram  oxalate 10 MG tablet  Commonly known as: LEXAPRO   Take 1 tablet (10 mg total) by mouth daily.     ICLUSIG  30 mg tablet  Generic drug: PONATinib   Take 1 tablet (30 mg total) by mouth daily. Swallow tablets whole. Do not crush, break, cut or chew tablets.     losartan  50 MG tablet  Commonly known as: COZAAR   Take 0.5 tablets (25 mg total) by mouth daily.     PREVYMIS  480 mg tablet  Generic drug: letermovir   Take 1 tablet (480 mg total) by mouth daily.     valACYclovir  500 MG tablet  Commonly known as: VALTREX   Take 1 tablet (500 mg total) by mouth daily.            Allergies:  Erythromycin, Other, Sulfa (sulfonamide antibiotics), and Azithromycin  ______________________________________________________________________  Pending Test Results (if blank, then none):  Pending Labs       Order Current Status    Cytogenetics AP Order In process    Immunoglobulin MRD Sequencing In process    Immunoglobulin MRD Sequencing In process    Fungal ([place]) Pathogen Culture Preliminary result          Most Recent Labs:  All lab results last 24 hours -   Recent Results (from the past 24 hours)   POCT Glucose    Collection Time: 12/14/23 11:52 AM   Result Value Ref Range    Glucose, POC 88 70 - 179 mg/dL   POCT Glucose    Collection Time: 12/14/23  4:01 PM   Result Value Ref Range    Glucose, POC 137 70 - 179 mg/dL   POCT Glucose    Collection Time: 12/14/23 10:50 PM   Result Value Ref Range    Glucose, POC 167 70 - 179 mg/dL   Comprehensive Metabolic Panel    Collection Time: 12/15/23  5:34 AM   Result Value Ref Range    Sodium 143 135 - 145 mmol/L    Potassium 4.0 3.4 - 4.8 mmol/L    Chloride 106 98 - 107 mmol/L    CO2 28.0 20.0 - 31.0 mmol/L    Anion Gap 9 5 - 14 mmol/L  BUN 16 9 - 23 mg/dL    Creatinine 9.62 9.52 - 1.02 mg/dL    BUN/Creatinine Ratio 28     eGFR CKD-EPI (2021) Female >90 >=60 mL/min/1.53m2    Glucose 133 70 - 179 mg/dL    Calcium  9.4 8.7 - 10.4 mg/dL    Albumin 3.8 3.4 - 5.0 g/dL    Total Protein 6.7 5.7 - 8.2 g/dL    Total Bilirubin 0.7 0.3 - 1.2 mg/dL    AST 32 <=84 U/L    ALT 90 (H) 10 - 49 U/L    Alkaline Phosphatase 81 46 - 116 U/L   Magnesium  Level    Collection Time: 12/15/23  5:34 AM   Result Value Ref Range    Magnesium  1.9 1.6 - 2.6 mg/dL   Phosphorus Level    Collection Time: 12/15/23  5:34 AM   Result Value Ref Range    Phosphorus 4.2 2.4 - 5.1 mg/dL   PT-INR    Collection Time: 12/15/23  5:34 AM   Result Value Ref Range    PT 10.9 9.9 - 12.6 sec    INR 0.96    C-reactive protein    Collection Time: 12/15/23  5:34 AM   Result Value Ref Range    CRP 14.3 (H) <=10.0 mg/L   CBC w/ Differential    Collection Time: 12/15/23  5:34 AM   Result Value Ref Range    WBC 6.3 3.6 - 11.2 10*9/L    RBC 2.74 (L) 3.95 - 5.13 10*12/L    HGB 9.8 (L) 11.3 - 14.9 g/dL    HCT 13.2 (L) 44.0 - 44.0 %    MCV 102.8 (H) 77.6 - 95.7 fL    MCH 35.9 (H) 25.9 - 32.4 pg    MCHC 34.9 32.0 - 36.0 g/dL    RDW 10.2 (H) 72.5 - 15.2 %    MPV 8.5 6.8 - 10.7 fL    Platelet 144 (L) 150 - 450 10*9/L    Neutrophils % 73.2 %    Lymphocytes % 19.5 %    Monocytes % 5.1 %    Eosinophils % 1.8 %    Basophils % 0.4 %    Absolute Neutrophils 4.6 1.8 - 7.8 10*9/L    Absolute Lymphocytes 1.2 1.1 - 3.6 10*9/L    Absolute Monocytes 0.3 0.3 - 0.8 10*9/L    Absolute Eosinophils 0.1 0.0 - 0.5 10*9/L    Absolute Basophils 0.0 0.0 - 0.1 10*9/L    Macrocytosis Slight (A) Not Present    Anisocytosis Marked (A) Not Present   Morphology Review    Collection Time: 12/15/23  5:34 AM   Result Value Ref Range    Smear Review Comments See Comment (A) Undefined    Polychromasia Slight (A) Not Present   POCT Glucose    Collection Time: 12/15/23  7:45 AM   Result Value Ref Range    Glucose, POC 186 (H) 70 - 179 mg/dL       Relevant Studies/Radiology (if blank, then none):  ECG 12 Lead  Result Date: 12/06/2023  NORMAL SINUS RHYTHM NORMAL ECG WHEN COMPARED WITH ECG OF 03-Dec-2023 12:50, NO SIGNIFICANT CHANGE WAS FOUND Confirmed by Sherral Do ([id number]) on 12/06/2023 9:57:25 PM    Echocardiogram W Colorflow Spectral Doppler  Result Date: 12/06/2023  Patient Info Name:     TRENITY PHA Age:     48 years DOB:     1976-01-25 Gender:     Female MRN:  [id number] Accession #:     [id number] UN Account #:     [id number] Ht:     168 cm Wt:     88 kg BSA:     2.06 m2 BP:     123 /     88 mmHg Exam Date:     12/05/2023 1:36 PM Admit Date:     11/24/2023 Exam Type:     ECHOCARDIOGRAM W COLORFLOW SPECTRAL DOPPLER Technical Quality:     Fair Staff Sonographer:     Dany Dyke Referring Physician:     Humphrey Magnuson Zeidner Reading Fellow:     Linward Rick MD Ordering Physician:     Arlon Bergamo Study Info Indications      - cardiotoxic medication Procedure(s)   Complete two-dimensional, color flow and Doppler transthoracic echocardiogram is performed. Summary   1. The left ventricle is normal in size with upper normal wall thickness.   2. The left ventricular systolic function is mildly decreased, LVEF is visually estimated at 45%.   3. The mitral valve leaflets are mildly thickened with normal leaflet mobility.   4. The right ventricle is normal in size, with normal systolic function.   5. There is a small, posterior pericardial effusion. Left Ventricle   The left ventricle is normal in size with upper normal wall thickness. The left ventricular systolic function is mildly decreased, LVEF is visually estimated at 45%. There is grade I diastolic dysfunction (impaired relaxation). Right Ventricle   The right ventricle is normal in size, with normal systolic function. Left Atrium   The left atrium is normal in size. Right Atrium   The right atrium is normal in size. Aortic Valve   The aortic valve is trileaflet with normal appearing leaflets with normal excursion. There is no significant aortic regurgitation. There is no evidence of a significant transvalvular gradient. Mitral Valve   The mitral valve leaflets are mildly thickened with normal leaflet mobility. There is trivial mitral valve regurgitation. Tricuspid Valve   The tricuspid valve leaflets are normal, with normal leaflet mobility. There is trivial tricuspid regurgitation. There is no pulmonary hypertension. TR maximum velocity: 2.5 m/s. Pulmonic Valve   The pulmonic valve is poorly visualized, but probably normal. There is no significant pulmonic regurgitation. There is no evidence of a significant transvalvular gradient. Aorta   The aorta is normal in size in the visualized segments. Inferior Vena Cava   The IVC is not well visualized precluding the ability to accurate assess right atrial pressure. Pericardium/Pleural   There is a small, posterior pericardial effusion. Maximal dimension of the pericardial effusion:  0.7 cm. Ventricles ---------------------------------------------------------------------- Name                                 Value        Normal ---------------------------------------------------------------------- LV Dimensions 2D/MM ----------------------------------------------------------------------  IVS Diastolic Thickness (2D)                                1.0 cm       0.6-0.9 LVID Diastole (2D)                  4.6 cm       3.8-5.2  LVPW Diastolic Thickness (2D)  1.1 cm       0.6-0.9 LVID Systole (2D)                   3.4 cm       2.2-3.5 LVOT Diameter                       2.1 cm               LV Mass Index (2D Cubed)           83 g/m2         43-95  Relative Wall Thickness (2D)                                  0.48        <=0.42 LV Function ---------------------------------------------------------------------- LV EF (4C MOD)                        42 %               RV Dimensions 2D/MM ---------------------------------------------------------------------- TAPSE                               1.4 cm         >=1.7 Atria ---------------------------------------------------------------------- Name                                 Value        Normal ---------------------------------------------------------------------- LA Dimensions ---------------------------------------------------------------------- LA Dimension (2D)                   3.0 cm       2.7-3.8 LA Volume Index (4C A-L)        16.53 ml/m2               RA Dimensions ---------------------------------------------------------------------- RA Area (4C)                       6.4 cm2        <=18.0 RA Area (4C) Index              3.1 cm2/m2               RA ESV Index (4C MOD)              5 ml/m2         15-27 Left Ventricular Outflow Tract ---------------------------------------------------------------------- Name                                 Value        Normal ---------------------------------------------------------------------- LVOT 2D ---------------------------------------------------------------------- LVOT Diameter                       2.1 cm               LVOT Area                          3.5 cm2 Aortic Valve ---------------------------------------------------------------------- Name  Value        Normal ---------------------------------------------------------------------- AV Doppler ---------------------------------------------------------------------- AV Peak Velocity                   1.1 m/s               AV Peak Gradient                    4 mmHg Mitral Valve ---------------------------------------------------------------------- Name                                 Value        Normal ---------------------------------------------------------------------- MV Diastolic Function ---------------------------------------------------------------------- MV E Peak Velocity                 60 cm/s               MV A Peak Velocity                 74 cm/s               MV E/A                                 0.8               MV Annular TDI ---------------------------------------------------------------------- MV Septal e' Velocity             4.2 cm/s         >=8.0 MV E/e' (Septal)                      14.2               MV Lateral e' Velocity            5.1 cm/s        >=10.0 MV E/e' (Lateral)                     11.8               MV e' Average                     4.7 cm/s               MV E/e' (Average)                     13.0 Tricuspid Valve ---------------------------------------------------------------------- Name                                 Value        Normal ---------------------------------------------------------------------- TV Regurgitation Doppler ---------------------------------------------------------------------- TR Peak Velocity                   2.5 m/s Pulmonic Valve ---------------------------------------------------------------------- Name                                 Value        Normal ---------------------------------------------------------------------- PV Doppler ---------------------------------------------------------------------- PV Peak Velocity                   0.9 m/s Aorta ---------------------------------------------------------------------- Name  Value        Normal ---------------------------------------------------------------------- Ascending Aorta ---------------------------------------------------------------------- Ao Root Diameter (2D)               3.0 cm               Ao Root Diam Index (2D)          1.5 cm/m2 Pericardium ---------------------------------------------------------------------- Name                                 Value        Normal ---------------------------------------------------------------------- Pericardium 2D/MM ----------------------------------------------------------------------  Pericardial Effusion Diastole (2D)                       0.7 cm Report Signatures Finalized by Geraldina Klinefelter  MD on 12/06/2023 10:57 AM Resident Linward Rick  MD on 12/05/2023 02:56 PM    ECG 12 Lead  Result Date: 12/03/2023  NORMAL SINUS RHYTHM LOW VOLTAGE QRS NONSPECIFIC ST AND T WAVE ABNORMALITY POOR R WAVE PROGRESSION IN PRECORDIAL LEADS WHEN COMPARED WITH ECG OF 26-Nov-2023 07:56, NO SIGNIFICANT CHANGE WAS FOUND Confirmed by Alica Inks [phone] on 12/03/2023 6:27:37 PM    ECG 12 Lead  Result Date: 11/26/2023  SINUS TACHYCARDIA LOW VOLTAGE QRS NONSPECIFIC ST AND T WAVE ABNORMALITY WHEN COMPARED WITH ECG OF 24-Nov-2023 09:40, RATE HAS INCREASED Confirmed by Alica Inks ([id number]) on 11/26/2023 1:50:08 PM    US  Liver Doppler  Result Date: 11/25/2023  EXAM: US  LIVER DOPPLER ACCESSION: [id number] UN REPORT DATE: 11/25/2023 2:47 PM CLINICAL INDICATION: 48 years old with Hepatitis workup  COMPARISON: 05/11/2022 CT abdomen and pelvis TECHNIQUE: Ultrasound views of the complete abdomen were obtained using grayscale, color Doppler, and spectral Doppler analysis. FINDINGS: LIVER: The liver was normal in echogenicity. No focal hepatic lesions. No intrahepatic biliary ductal dilatation. The common bile duct was normal in caliber.      Liver: 15.1 cm      Common bile duct: 0.56 cm GALLBLADDER: The gallbladder is physiologically distended without internal stones or sludge. Subcentimeter sessile gallbladder polyp along the anterior wall measuring up to 0.3 cm. No pericholecystic fluid. No gallbladder wall thickening.      Gallbladder wall: 0.26  cm PANCREAS: Poorly visualized due to overlying bowel gas. SPLEEN: Borderline splenomegaly.      Spleen: 13.2 cm KIDNEYS: Normal in size and echotexture. No solid masses. Subcentimeter bilateral echogenic nonshadowing renal foci, which may represent artifact versus nonobstructing calculi. Mild right pelviectasis, without caliectasis. No left pelvocaliectasis.      Right kidney: 10.7 cm      Left kidney: 11.8 cm VESSELS - Portal vein: The main, left and right portal veins were patent with hepatopetal flow. Normal main portal vein velocity (0.20 m/s or greater)      Main portal vein diameter: 0.89 cm      Main portal vein velocity: 0.33 m/s      Anterior right portal vein velocity: 0.23 m/s      Posterior right portal vein velocity: 0.27 m/s      Left portal vein velocity: 0.12 m/s      Main portal vein flow: hepatopetal      Right portal vein flow: hepatopetal      Left portal vein flow: hepatopetal - Splenic vein: Patent, with hepatopetal flow.      Splenic vein midline: hepatopetal      Splenic vein proximal: hepatopetal - Hepatic veins/IVC:  The IVC, left, middle and right hepatic veins were patent.      Left hepatic vein phasicity/flow: biphasic      Middle hepatic vein phasicity/flow: biphasic      Right hepatic vein phasicity/flow: biphasic      Inferior vena cava phasicity/flow: mono-bi - Hepatic artery: Patent with color and spectral Doppler imaging      Common hepatic artery: Patent - Visualized proximal aorta:  unremarkable      Aorta: partially visualized OTHER: No ascites.     1.  Patent hepatic vasculature with normal flow direction. 2.  Borderline splenomegaly. 3.  Subcentimeter sessile gallbladder polyp measuring up to 0.3 cm. Per SRU guidelines, no further follow-up is recommended. 4.  Nonspecific mild right pelviectasis.     MRI Cervical Spine W Wo Contrast  Result Date: 11/24/2023  EXAM: Magnetic resonance imaging, spinal canal and contents, cervical without and with contrast material. DATE: 11/24/2023 3:41 PM ACCESSION: [id number] UN DICTATED: 11/24/2023 3:50 PM INTERPRETATION LOCATION: [facility] CLINICAL INDICATION: 48 years old Female with neck pain, hx of leukemia  COMPARISON: CT sinus dated 07/08/2022. TECHNIQUE: Multiplanar MRI was performed through the cervical spine without and with intravenous contrast. FINDINGS: Degenerative endplate changes at C5-C6. Bone marrow signal intensity is otherwise normal.  Longitudinally extensive T2/STIR hyperintense cord signal extending from the medulla through C7, and may faintly extending into the thoracic spine. There is no abnormal enhancement. Reversal of normal cervical lordosis. Vertebral body heights are normal. The vertebral bodies are normally aligned.  Multilevel disc desiccation with height loss at C5-C6 and C6-C7. C2-C3: No significant spinal canal or neuroforaminal narrowing. C3-C4: Disc osteophyte complex causing mild spinal canal stenosis, and abutting the ventral cord with mild cord contour deformity. Bilateral neural foramina are patent. C4-C5: Disc osteophyte complex abutting the ventral cord. Mild uncovertebral hypertrophy. Mild spinal canal stenosis. No neuroforaminal narrowing. C5-C6: Disc osteophyte complex with prominent uncovertebral hypertrophy. Mild spinal canal stenosis. Mild right and moderate to severe left neuroforaminal narrowing. C6-C7: Disc osteophyte complex with uncovertebral and left facet hypertrophy. Mild spinal canal stenosis. Mild left neuroforaminal narrowing. Right-sided perineural cyst. C7-T1: Ligamentum flavum thickening and left facet hypertrophy with small left facet cyst. No significant spinal canal or neuroforaminal narrowing.  The paraspinal tissues are within normal limits.      Longitudinally extensive cord signal abnormality without enhancement, suggestive of an acute myelitis. Indeterminate etiology with broad differential but including toxic or metabolic process, including medication related, or a viral myelitis. See concurrent brain MRI for additional signal abnormalities of the intracranial contents. Degenerative changes of the cervical spine with mild spinal canal stenosis and varying degrees of neuroforaminal narrowing, most pronounced at C5-C6.     MRI Brain W Wo Contrast  Result Date: 11/24/2023  EXAM: Magnetic resonance imaging, brain, without and with contrast material. ACCESSION: [id number] UN CLINICAL INDICATION: 47 years old Female with headache, hx of leukemia, on chemo  COMPARISON: None TECHNIQUE: Multiplanar, multisequence MR imaging of the brain was performed without and with I.V. contrast. FINDINGS:  There is hyperintense T2 signal and mild expansion of the corpus callosum genu, body and splenium. Symmetric T2 FLAIR signal abnormality in the bilateral middle cerebellar peduncles, medulla and visualized cervical spinal cord. Mild diffuse T2 FLAIR signal abnormality in the periventricular white matter. No associated enhancement or restricted diffusion.  FLAIR signal within the sulci of the bilateral temporoparietal lobes without leptomeningeal enhancement. Ventricles are normal in size. There is no midline shift. No extra-axial fluid collection. No evidence of intracranial hemorrhage.  No diffusion weighted signal abnormality to suggest acute infarct.     T2 FLAIR signal abnormality and mild expansion of the corpus callosum as well as symmetric T2 FLAIR signal abnormality in the periventricular white matter, middle cerebellar peduncles, medulla and cervical spinal cord. Nonspecific, could reflect toxic metabolic processes, medication induced, treatment related, etc. Leukemic infiltration and while infection remain in the differential but considered less likely. FLAIR hyperintense signal within the sulci of the parietotemporal lobes without associated enhancement or SWI signal dropout. Finding may represent artifact, oxygen administration, medication induced or less likely meningitis. Please see separately dictated CT cervical spine report for additional findings.     ECG 12 Lead  Result Date: 11/24/2023  NORMAL SINUS RHYTHM ANTERIOR INFARCT  , AGE UNDETERMINED ABNORMAL ECG WHEN COMPARED WITH ECG OF 12-May-2022 23:12, NONSPECIFIC T WAVE ABNORMALITY NOW EVIDENT IN ANTEROLATERAL LEADS Confirmed by Billy Bue [phone]) on 11/24/2023 10:01:21 AM    ______________________________________________________________________  Discharge Instructions:     Activity Instructions       Activity as tolerated              Other Instructions       Call MD for:  difficulty breathing, headache or visual disturbances      Call MD for:  persistent nausea or vomiting      Call MD for:  severe uncontrolled pain      Call MD for:  temperature >38.5 Celsius      Discharge instructions      It was a pleasure taking care of you!    Diagnosis: ALL with CNS involvement    Chemo-/immunotherapy during admission? Yes  - Type of chemo-/immunotherapy: blinatumomab , home ponatinib     New/Important Medications:  - Blinatumomab  (outpatient infusions)    --------------    When to Call Your [facility] Cancer Care Team:   Monday- Friday from 8:00 am - 5:00 pm: Call [phone] or Toll free [phone].  Ask to speak to the Triage Nurse  On Nights, Weekends and Holidays: Call [phone]. Ask the operator to page the Oncology Fellow on Call     RED ZONE:  Take action now!  You need to be seen right away. Call 911 or go to your nearest hospital for help.   - Symptoms are at a severe level of discomfort    - Bleeding that will not stop  - Chest Pain    - Hard to breathe    - Fall or passing out    - New Seizure    - Thoughts of hurting yourself or others     YELLOW ZONE: Take action today  This is NOT an all-inclusive list. Pleae call with any new or worsening symptoms.   Call your doctor, nurse or other healthcare provider at [phone]  You can be seen by a provider the same day through our Same Day Acute Care for Patients with Cancer program.   - Symptoms are new or worsening; You are not within your goal range for:    - Pain          - Swelling (leg, arm, abdomen, face, neck)    - Shortness of breath        - Skin rash or skin changes    - Bleeding (nose, urine, stool, wound)    - Wound issues (redness, drainage, re-opened)    - Feeling sick to your stomach and throwing up    - Confusion    - Mouth sores/pain in your mouth or  throat     - Vision changes   - Hard stool or very loose stools (increase in ostomy   - Fever >100.4 F, chills     Output)        - Worsening cough with mucus that is green, yellow or bloody   - No urine for 12 hours      - Pain or burning when going to the bathroom    - Feeding tube or other catheter/tube issue     - Home infusion pump issue - call [phone]   - Redness or pain at previous IV or port/catheter site    - Depressed or anxiety     GREEN ZONE: You are in control   Your symptoms are under control. Continue to take your medicine as ordered. Keep all visits to the doctor.   - No increase or worsening symptoms   - Able to take your medicine   - Able to drink and eat     For your safety and best care, please DO NOT use MyChart messages to report red or yellow symptoms.   MyChart messages are only checked during weekday normal business hours and you should receive a   Response within 2 business days.   Please use MyChart only for the follows:   - Non-urgent medication refills, scheduling requests or general questions.       -----    Patient Education:     - Wash your hands routinely with soap and water  - Take your temperature when you have chills or are not feeling well  - Use a soft toothbrush  - Avoid constipation or straining with bowel movements. This may mean you occasionally need to take over-the-counter stool softeners or laxatives.   - Avoid people who have colds or the flu, or are not feeling well.  - Wear a mask when visiting crowded places.  - Maintain a well-balanced diet and eat healthy foods  - Speak with your doctor before having any dental work done  - Do only as much activity as you can tolerate    Other instructions:  - Don't use dental floss if your platelet count is below 50,000. Your doctor or nurse should tell you if this is the case.  - Use any mouthwashes given to you as directed.  - If you can't tolerate regular brushing, use an oral swab (bristle-less) toothbrush, or use salt and baking soda to clean your mouth. Mix 1 teaspoon of salt and 1 teaspoon of baking soda into an 8-ounce glass of warm water. Swish and spit.  - Watch your mouth and tongue for white patches. This is a sign of fungal infection, a common side effect of chemotherapy. Be sure to tell your doctor about these patches. Medication/mouthwashes can be prescribed to help you fight the fungal infection.      COVID-19 is a new challenge, but [facility] is dedicated to providing you and your loved ones with the best possible cancer care and support in the safest way possible during this time. We made two videos about the ways we are working to keep you safe, such as offering the option to visit your care team over the phone or through a video, as well as support services offered for our patients and their caregivers. If you have any questions about your cancer care, please call your care team.     Video #1: Keeping [facility] Cancer Care patients safe during the  COVID-19 crisis  [email or web address]     Video #2: Support for cancer patients and their caregivers during the COVID-19 pandemic  [email or web address]://[email or web address]     Video #2: Support for cancer patients and their caregivers during the COVID-19 pandemic  [email or web address].[email or web address]          Follow Up instructions and Outpatient Referrals     CBC and differential      Release to patient: Immediate    Call MD for:  difficulty breathing, headache or visual disturbances      Call MD for:  persistent nausea or vomiting      Call MD for:  severe uncontrolled pain      Call MD for:  temperature >38.5 Celsius      Comprehensive Metabolic Panel      Is this a fasting order?: No    Release to patient: Immediate     CMP contains the following tests: NA, K, CL, CO2, BUN, CR, GLUC, CA,   Albumin, Total Protein, Total Bilirubin, AST, ALT, and Alkaline   Phosphatase.     Discharge instructions      Magnesium  Level      Release to patient: Immediate    Phosphorus      Release to patient: Immediate    CBC and differential      Release to patient: Immediate    Comprehensive Metabolic Panel      Is this a fasting order?: No    Release to patient: Immediate     CMP contains the following tests: NA, K, CL, CO2, BUN, CR, GLUC, CA,   Albumin, Total Protein, Total Bilirubin, AST, ALT, and Alkaline   Phosphatase.     Magnesium  Level      Release to patient: Immediate    Phosphorus      Release to patient: Immediate    C-reactive protein      Release to patient: Immediate    CBC and differential      Release to patient: Immediate    Comprehensive Metabolic Panel      Is this a fasting order?: No    Release to patient: Immediate     CMP contains the following tests: NA, K, CL, CO2, BUN, CR, GLUC, CA,   Albumin, Total Protein, Total Bilirubin, AST, ALT, and Alkaline   Phosphatase.     Magnesium  Level      Release to patient: Immediate    Phosphorus      Release to patient: Immediate    CBC and differential      Release to patient: Immediate    Comprehensive Metabolic Panel      Is this a fasting order?: No    Release to patient: Immediate     CMP contains the following tests: NA, K, CL, CO2, BUN, CR, GLUC, CA,   Albumin, Total Protein, Total Bilirubin, AST, ALT, and Alkaline   Phosphatase.     Magnesium  Level      Release to patient: Immediate    Phosphorus      Release to patient: Immediate        Appointments which have been scheduled for you      Dec 18, 2023 11:15 AM  (Arrive by 10:45 AM)  HEM INFUSION ONLY with [employer] CHAIR 03  [place] ONCOLOGY INFUSION [facility] REGION) [address]  [place] [address]  [phone]        Dec 18, 2023 1:40 PM  (Arrive by 1:10 PM)  LUMBAR PUNCTURE  PROC with Twyla Galeazzi, FNP  [place] ONCOLOGY INFUSION [place] [facility] REGION) [address]  [place] [address]  [phone]        Dec 20, 2023 1:30 PM  (Arrive by 1:00 PM)  HEM INFUSION ONLY with [employer] CHAIR 09  [place] ONCOLOGY INFUSION [place] REGION) [address]  [place] [address]  [phone]        Dec 27, 2023 12:00 PM  (Arrive by 11:30 AM)  HEM INFUSION ONLY with [employer] CHAIR 07  [place] ONCOLOGY INFUSION [facility] REGION) [address]  [place] [address]  [phone]        Dec 27, 2023 2:30 PM  (Arrive by 2:00 PM)  LUMBAR PUNCTURE PROC with Viki Graver, PA  [facility] ONCOLOGY INFUSION [place] [facility] REGION) [address]  [place] [address]  [phone]        Jan 03, 2024 12:15 PM  (Arrive by 11:45 AM)  HEM INFUSION ONLY with [employer] CHAIR 09  [place] ONCOLOGY INFUSION [facility] REGION) [address]  [place] [address]  [phone]        Jan 03, 2024 1:30 PM  (Arrive by 1:00 PM)  LAB DRAW with ADULT ONC LAB  [facility] ADULT ONCOLOGY LAB DRAW STATION [place] [facility] REGION) [address]  [place] [address]  [phone]        Jan 03, 2024 2:30 PM  (Arrive by 2:00 PM)  LUMBAR PUNCTURE PROC with Viki Graver, PA  [place] ONCOLOGY INFUSION [place] [facility] REGION) [address]  [place] [address]  [phone]        Jan 09, 2024 1:45 PM  (Arrive by 1:15 PM)  HEM INFUSION ONLY with [employer] CHAIR 22  [place] ONCOLOGY INFUSION [place] [facility] REGION) [address]  [place] [address]  [phone]        Jan 09, 2024 3:30 PM  (Arrive by 3:00 PM)  LUMBAR PUNCTURE PROC with Virlinda Grimmer, AGNP  [place] ONCOLOGY INFUSION [place] [facility] REGION) [address]  [place] [address]  [phone]        Jan 15, 2024 11:30 AM  (Arrive by 11:00 AM)  LAB ONLY [place] with ADULT ONC LAB  [facility] ADULT ONCOLOGY LAB DRAW STATION [facility] REGION) [address]  [place] [address]  [phone]        Jan 15, 2024 12:40 PM  (Arrive by 12:10 PM)  BONE MARROW BIOPSY with Twyla Galeazzi, FNP  [place] ONCOLOGY INFUSION [facility] REGION) [address]  [place] [address]  [phone]        Jan 24, 2024 2:00 PM  (Arrive by 1:30 PM)  LAB ONLY [place] with ADULT ONC LAB  [facility] ADULT ONCOLOGY LAB DRAW STATION  [facility] REGION) [address]  [place] [address]  [phone]        Jan 24, 2024 3:00 PM  (Arrive by 2:30 PM)  RETURN ACTIVE [place] with Tanya Fantasia, MD  [facility] HEMATOLOGY ONCOLOGY 2ND FLR CANCER HOSP [facility] REGION) [address]  [place] [address]  [phone]        Jan 25, 2024 11:00 AM  (Arrive by 10:30 AM)  LAB  ONLY [place] with ADULT ONC LAB  [facility] ADULT ONCOLOGY LAB DRAW STATION [place] [facility] REGION) [address]  [place] [address]  [phone]        Jan 25, 2024 12:00 PM  (Arrive by 11:30 AM)  SCHED ADM THROUGH INF with ONCINF CHAIR 37  [place] ONCOLOGY INFUSION [facility] REGION) [address]  [place] [address]  [phone]        Jan 30, 2024 9:30 AM  (Arrive by 9:00 AM)  RETURN VIDEO MYCHART with Raenette Bumps, MD  [place] ONCOLOGY MULTIDISCIPLINARY 2ND FLR CANCER HOSP [facility] REGION) [address]  [place] [address]  [phone]   Please sign into My [place] Chart at least 15 minutes before your appointment to complete the eCheck-In process. You must complete eCheck-In before you can start your video visit. We also recommend testing your audio and video connection to troubleshoot any issues before your visit begins. Click ???Join Video Visit??? to complete these checks. Once you have completed eCheck-In and tested your audio and video, click ???Join Call??? to connect to your visit.     For your video visit, you will need a computer with a working camera, speaker and microphone, a smartphone, or a tablet with internet access.    My [place] Chart enables you to manage your health, send non-urgent messages to your provider, view your test results, schedule and manage appointments, and request prescription refills securely and conveniently from your computer or mobile device.    You can go to [email or web address] to sign in to your My [place] Chart account with your username and password. If you have forgotten your username or password, please choose the ???Forgot Username???? and/or ???Forgot Password???? links to gain access. You also can access your My [place] Chart account with the free MyChart mobile app for Android or iPhone.    If you need assistance accessing your My  Chart account or for assistance in reaching your provider's office to reschedule or cancel your appointment, please call [facility] [phone].         Jan 31, 2024 10:00 AM  (Arrive by 9:30 AM)  RETURN ACTIVE [place] with Carolin Chyle, MD  [facility] HEMATOLOGY ONCOLOGY 2ND FLR CANCER HOSP [facility] REGION) [address]  [place] [address]  [phone]             ______________________________________________________________________  Discharge Day Services:  BP 110/86  - Pulse 103  - Temp 36.8 ??C (98.2 ??F) (Oral)  - Resp 17  - Ht 167.6 cm (5' 6)  - Wt 88.2 kg (194 lb 8 oz)  - SpO2 95%  - BMI 31.39 kg/m??     Pt seen on the day of discharge and determined appropriate for discharge.    Condition at Discharge: fair    Length of Discharge: I spent greater than 30 mins in the discharge of this patient.    Claria Crofts, MD  Internal Medicine PGY-1

## 2023-12-15 NOTE — Unmapped (Signed)
 VSS. ICANS 10/10. Transferring to clinic to be set up with home chemo.     Problem: Adult Inpatient Plan of Care  Goal: Plan of Care Review  Outcome: Ongoing - Unchanged  Goal: Patient-Specific Goal (Individualized)  Outcome: Ongoing - Unchanged  Goal: Absence of Hospital-Acquired Illness or Injury  Outcome: Ongoing - Unchanged  Intervention: Identify and Manage Fall Risk  Recent Flowsheet Documentation  Taken 12/15/2023 0719 by Kary Pages, RN  Safety Interventions:   bleeding precautions   chemotherapeutic agent precautions   environmental modification   fall reduction program maintained   family at bedside   infection management   isolation precautions   lighting adjusted for tasks/safety   low bed   neutropenic precautions   nonskid shoes/slippers when out of bed  Intervention: Prevent Skin Injury  Recent Flowsheet Documentation  Taken 12/15/2023 0810 by Kary Pages, RN  Positioning for Skin: Supine/Back  Device Skin Pressure Protection: adhesive use limited  Skin Protection: adhesive use limited  Taken 12/15/2023 0719 by Kary Pages, RN  Positioning for Skin: Left  Device Skin Pressure Protection: adhesive use limited  Skin Protection: adhesive use limited  Intervention: Prevent Infection  Recent Flowsheet Documentation  Taken 12/15/2023 0719 by Kary Pages, RN  Infection Prevention:   cohorting utilized   environmental surveillance performed   equipment surfaces disinfected   hand hygiene promoted   personal protective equipment utilized   rest/sleep promoted   single patient room provided   visitors restricted/screened  Goal: Optimal Comfort and Wellbeing  Outcome: Ongoing - Unchanged  Goal: Readiness for Transition of Care  Outcome: Ongoing - Unchanged  Goal: Rounds/Family Conference  Outcome: Ongoing - Unchanged

## 2023-12-15 NOTE — Unmapped (Signed)
 Pt arrived to chair 46 in outpatient oncology infusion for blina bag hook up from inpatient. Pt accessed via port right chest, with positive blood return. Treatment completed, no concerns. Chemo precaution teaching given to pt and husband. Pt discharged with CADD pump infusing per orders. Pt discharged ambulatory. AVS declined

## 2023-12-15 NOTE — Unmapped (Signed)
 Patient Patricia [name], VSS, afebrile. No falls or acute events this shift. Given tylenol  for pain. Blina running at 10mL/hr. ICE score completed. Anticipated discharge today. Plan of Care reviewed with patient.  Safety measures in place of bed low with brakes locked, non-skid footwear on while out of bed, call bell within reach.       Problem: Adult Inpatient Plan of Care  Goal: Plan of Care Review  12/15/2023 0550 by Elenora Griffiths, RN  Outcome: Ongoing - Unchanged  12/14/2023 1934 by Elenora Griffiths, RN  Outcome: Ongoing - Unchanged  Goal: Patient-Specific Goal (Individualized)  12/15/2023 0550 by Elenora Griffiths, RN  Outcome: Ongoing - Unchanged  12/14/2023 1934 by Elenora Griffiths, RN  Outcome: Ongoing - Unchanged  Goal: Absence of Hospital-Acquired Illness or Injury  12/15/2023 0550 by Elenora Griffiths, RN  Outcome: Ongoing - Unchanged  12/14/2023 1934 by Elenora Griffiths, RN  Outcome: Ongoing - Unchanged  Intervention: Identify and Manage Fall Risk  Recent Flowsheet Documentation  Taken 12/14/2023 1925 by Elenora Griffiths, RN  Safety Interventions:   bleeding precautions   environmental modification   fall reduction program maintained   infection management   isolation precautions   lighting adjusted for tasks/safety   low bed   neutropenic precautions   nonskid shoes/slippers when out of bed  Intervention: Prevent Skin Injury  Recent Flowsheet Documentation  Taken 12/14/2023 1925 by Elenora Griffiths, RN  Positioning for Skin: Supine/Back  Device Skin Pressure Protection: adhesive use limited  Skin Protection:   adhesive use limited   transparent dressing maintained  Intervention: Prevent Infection  Recent Flowsheet Documentation  Taken 12/14/2023 1925 by Elenora Griffiths, RN  Infection Prevention:   cohorting utilized   environmental surveillance performed   equipment surfaces disinfected   personal protective equipment utilized   hand hygiene promoted   rest/sleep promoted   single patient room provided   visitors restricted/screened  Goal: Optimal Comfort and Wellbeing  12/15/2023 0550 by Elenora Griffiths, RN  Outcome: Ongoing - Unchanged  12/14/2023 1934 by Elenora Griffiths, RN  Outcome: Ongoing - Unchanged  Goal: Readiness for Transition of Care  12/15/2023 0550 by Elenora Griffiths, RN  Outcome: Ongoing - Unchanged  12/14/2023 1934 by Elenora Griffiths, RN  Outcome: Ongoing - Unchanged  Goal: Rounds/Family Conference  12/15/2023 0550 by Elenora Griffiths, RN  Outcome: Ongoing - Unchanged  12/14/2023 1934 by Elenora Griffiths, RN  Outcome: Ongoing - Unchanged     Problem: Fall Injury Risk  Goal: Absence of Fall and Fall-Related Injury  12/15/2023 0550 by Elenora Griffiths, RN  Outcome: Ongoing - Unchanged  12/14/2023 1934 by Elenora Griffiths, RN  Outcome: Ongoing - Unchanged  Intervention: Promote Injury-Free Environment  Recent Flowsheet Documentation  Taken 12/14/2023 1925 by Elenora Griffiths, RN  Safety Interventions:   bleeding precautions   environmental modification   fall reduction program maintained   infection management   isolation precautions   lighting adjusted for tasks/safety   low bed   neutropenic precautions   nonskid shoes/slippers when out of bed     Problem: Comorbidity Management  Goal: Maintenance of Asthma Control  12/15/2023 0550 by Elenora Griffiths, RN  Outcome: Ongoing - Unchanged  12/14/2023 1934 by Elenora Griffiths, RN  Outcome: Ongoing - Unchanged  Goal: Maintenance of Behavioral Health Symptom Control  12/15/2023 0550 by Elenora Griffiths, RN  Outcome: Ongoing - Unchanged  12/14/2023 1934 by Elenora Griffiths, RN  Outcome: Ongoing - Unchanged  Goal: Maintenance of COPD Symptom Control  12/15/2023 0550 by Elenora Griffiths, RN  Outcome: Ongoing - Unchanged  12/14/2023 1934 by Elenora Griffiths, RN  Outcome: Ongoing - Unchanged  Goal: Blood Glucose Levels Within Targeted Range  12/15/2023 0550 by Elenora Griffiths, RN  Outcome: Ongoing - Unchanged  12/14/2023 1934 by Elenora Griffiths, RN  Outcome: Ongoing - Unchanged  Goal: Maintenance of Heart Failure Symptom Control  12/15/2023 0550 by Elenora Griffiths, RN  Outcome: Ongoing - Unchanged  12/14/2023 1934 by Elenora Griffiths, RN  Outcome: Ongoing - Unchanged  Goal: Blood Pressure in Desired Range  12/15/2023 0550 by Elenora Griffiths, RN  Outcome: Ongoing - Unchanged  12/14/2023 1934 by Elenora Griffiths, RN  Outcome: Ongoing - Unchanged  Goal: Maintenance of Osteoarthritis Symptom Control  12/15/2023 0550 by Elenora Griffiths, RN  Outcome: Ongoing - Unchanged  12/14/2023 1934 by Elenora Griffiths, RN  Outcome: Ongoing - Unchanged  Goal: Bariatric Home Regimen Maintained  12/15/2023 0550 by Elenora Griffiths, RN  Outcome: Ongoing - Unchanged  12/14/2023 1934 by Elenora Griffiths, RN  Outcome: Ongoing - Unchanged  Goal: Maintenance of Seizure Control  12/15/2023 0550 by Elenora Griffiths, RN  Outcome: Ongoing - Unchanged  12/14/2023 1934 by Elenora Griffiths, RN  Outcome: Ongoing - Unchanged     Problem: Pain Acute  Goal: Optimal Pain Control and Function  12/15/2023 0550 by Elenora Griffiths, RN  Outcome: Ongoing - Unchanged  12/14/2023 1934 by Elenora Griffiths, RN  Outcome: Ongoing - Unchanged

## 2023-12-18 ENCOUNTER — Inpatient Hospital Stay: Admit: 2023-12-18 | Discharge: 2023-12-19 | Payer: PRIVATE HEALTH INSURANCE

## 2023-12-18 ENCOUNTER — Ambulatory Visit: Admit: 2023-12-18 | Discharge: 2023-12-19 | Payer: PRIVATE HEALTH INSURANCE

## 2023-12-18 DIAGNOSIS — J0111 Acute recurrent frontal sinusitis: Principal | ICD-10-CM

## 2023-12-18 DIAGNOSIS — C9100 Acute lymphoblastic leukemia not having achieved remission: Principal | ICD-10-CM

## 2023-12-18 LAB — CBC W/ AUTO DIFF
BASOPHILS ABSOLUTE COUNT: 0 10*9/L (ref 0.0–0.1)
BASOPHILS RELATIVE PERCENT: 0.3 %
EOSINOPHILS ABSOLUTE COUNT: 0.1 10*9/L (ref 0.0–0.5)
EOSINOPHILS RELATIVE PERCENT: 1.3 %
HEMATOCRIT: 30.2 % — ABNORMAL LOW (ref 34.0–44.0)
HEMOGLOBIN: 10.6 g/dL — ABNORMAL LOW (ref 11.3–14.9)
LYMPHOCYTES ABSOLUTE COUNT: 1.3 10*9/L (ref 1.1–3.6)
LYMPHOCYTES RELATIVE PERCENT: 19.6 %
MEAN CORPUSCULAR HEMOGLOBIN CONC: 34.9 g/dL (ref 32.0–36.0)
MEAN CORPUSCULAR HEMOGLOBIN: 37.3 pg — ABNORMAL HIGH (ref 25.9–32.4)
MEAN CORPUSCULAR VOLUME: 106.9 fL — ABNORMAL HIGH (ref 77.6–95.7)
MEAN PLATELET VOLUME: 8.2 fL (ref 6.8–10.7)
MONOCYTES ABSOLUTE COUNT: 0.4 10*9/L (ref 0.3–0.8)
MONOCYTES RELATIVE PERCENT: 6.3 %
NEUTROPHILS ABSOLUTE COUNT: 4.9 10*9/L (ref 1.8–7.8)
NEUTROPHILS RELATIVE PERCENT: 72.5 %
PLATELET COUNT: 178 10*9/L (ref 150–450)
RED BLOOD CELL COUNT: 2.83 10*12/L — ABNORMAL LOW (ref 3.95–5.13)
RED CELL DISTRIBUTION WIDTH: 29.6 % — ABNORMAL HIGH (ref 12.2–15.2)
WBC ADJUSTED: 6.8 10*9/L (ref 3.6–11.2)

## 2023-12-18 LAB — COMPREHENSIVE METABOLIC PANEL
ALBUMIN: 3.9 g/dL (ref 3.4–5.0)
ALKALINE PHOSPHATASE: 81 U/L (ref 46–116)
ALT (SGPT): 53 U/L — ABNORMAL HIGH (ref 10–49)
ANION GAP: 9 mmol/L (ref 5–14)
AST (SGOT): 24 U/L (ref <=34)
BILIRUBIN TOTAL: 0.7 mg/dL (ref 0.3–1.2)
BLOOD UREA NITROGEN: 19 mg/dL (ref 9–23)
BUN / CREAT RATIO: 31
CALCIUM: 9.4 mg/dL (ref 8.7–10.4)
CHLORIDE: 106 mmol/L (ref 98–107)
CO2: 26 mmol/L (ref 20.0–31.0)
CREATININE: 0.61 mg/dL (ref 0.55–1.02)
EGFR CKD-EPI (2021) FEMALE: >90 mL/min/1.73m2 (ref >=60)
GLUCOSE RANDOM: 347 mg/dL — ABNORMAL HIGH (ref 70–179)
POTASSIUM: 4.3 mmol/L (ref 3.4–4.8)
PROTEIN TOTAL: 6.6 g/dL (ref 5.7–8.2)
SODIUM: 141 mmol/L (ref 135–145)

## 2023-12-18 LAB — HEMATOPATHOLOGY LEUKEMIA/LYMPHOMA FLOW CYTOMETRY, CSF
LYMPHS CSF: 89 %
MONO/MACROPHAGE CSF: 10 %
NEUTROPHILS, CSF: 1 %
NUCLEATED CELLS, CSF: 16 ul — ABNORMAL HIGH (ref <=5)
RBC CSF: 131 ul — ABNORMAL HIGH (ref <2)

## 2023-12-18 LAB — MAGNESIUM: MAGNESIUM: 1.9 mg/dL (ref 1.6–2.6)

## 2023-12-18 LAB — PHOSPHORUS: PHOSPHORUS: 3 mg/dL (ref 2.4–5.1)

## 2023-12-18 MED ORDER — AMOXICILLIN 875 MG-POTASSIUM CLAVULANATE 125 MG TABLET
ORAL_TABLET | Freq: Two times a day (BID) | ORAL | 0 refills | 10.00000 days | Status: CP
Start: 2023-12-18 — End: 2023-12-28

## 2023-12-18 MED ADMIN — traMADol (ULTRAM) tablet 50 mg: 50 mg | ORAL | @ 20:00:00 | Stop: 2023-12-18

## 2023-12-18 MED ADMIN — blinatumomab (BLINCYTO) 28 mcg/day in sodium chloride (non-PVC) 0.9 % IVPB (48-HR CADD INFUSION): 28 ug/d | INTRAVENOUS | @ 17:00:00

## 2023-12-18 MED ADMIN — midazolam (VERSED) injection 2 mg: 2 mg | INTRAVENOUS | @ 18:00:00 | Stop: 2023-12-18

## 2023-12-18 MED ADMIN — cytarabine (PF) (ARA-C) 100 mg, hydrocortisone sod succ (Solu-CORTEF) 50 mg in sodium chloride (NS) 0.9 % 6 mL INTRATHECAL syringe: INTRATHECAL | @ 18:00:00 | Stop: 2023-12-18

## 2023-12-18 NOTE — Unmapped (Signed)
 RED ZONE Means: RED ZONE: Take action now!     You need to be seen right away  Symptoms are at a severe level of discomfort    Call 911 or go to your nearest  Hospital for help     - Bleeding that will not stop    - Hard to breathe    - New seizure - Chest pain  - Fall or passing out  -Thoughts of hurting    yourself or others      Call 911 if you are going into the RED ZONE                  YELLOW ZONE Means:     Please call with any new or worsening symptom(s), even if not on this list.  Call [phone]  After hours, weekends, and holidays - you will reach a long recording with specific instructions, If not in an emergency such as above, please listen closely all the way to the end and choose the option that relates to your need.   You can be seen by a provider the same day through our Same Day Acute Care for Patients with Cancer program.      YELLOW ZONE: Take action today     Symptoms are new or worsening  You are not within your goal range for:    - Pain    - Shortness of breath    - Bleeding (nose, urine, stool, wound)    - Feeling sick to your stomach and throwing up    - Mouth sores/pain in your mouth or throat    - Hard stool or very loose stools (increase in       ostomy output)    - No urine for 12 hours    - Feeding tube or other catheter/tube issue    - Redness or pain at previous IV or port/catheter site    - Depressed or anxiety   - Swelling (leg, arm, abdomen,     face, neck)  - Skin rash or skin changes  - Wound issues (redness, drainage,    re-opened)  - Confusion  - Vision changes  - Fever >100.4 F or chills  - Worsening cough with mucus that is    green, yellow, or bloody  - Pain or burning when going to the    bathroom  - Home Infusion Pump Issue- call    [phone]         Call your healthcare provider if you are going into the YELLOW ZONE     GREEN ZONE Means:  Your symptoms are under controls  Continue to take your medicine as ordered  Keep all visits to the provider GREEN ZONE: You are in control  No increase or worsening symptoms  Able to take your medicine  Able to drink and eat    - DO NOT use MyChart messages to report red or yellow symptoms. Allow up to 3    business days for a reply.  -MyChart is for non-urgent medication refills, scheduling requests, or other general questions.         [id number] Rev. 01/28/2022  Approved by Oncology Patient Education Committee     Hospital Outpatient Visit on 12/18/2023   Component Date Value Ref Range Status    Sodium 12/18/2023 141  135 - 145 mmol/L Final    Potassium 12/18/2023 4.3  3.4 - 4.8 mmol/L Final    Chloride 12/18/2023 106  98 - 107  mmol/L Final    CO2 12/18/2023 26.0  20.0 - 31.0 mmol/L Final    Anion Gap 12/18/2023 9  5 - 14 mmol/L Final    BUN 12/18/2023 19  9 - 23 mg/dL Final    Creatinine 09/32/3557 0.61  0.55 - 1.02 mg/dL Final    BUN/Creatinine Ratio 12/18/2023 31   Final    eGFR CKD-EPI (2021) Female 12/18/2023 >90  >=60 mL/min/1.10m2 Final    eGFR calculated with CKD-EPI 2021 equation in accordance with [employer] and [employer] of Nephrology Task Force recommendations.    Glucose 12/18/2023 347 (H)  70 - 179 mg/dL Final    Calcium  12/18/2023 9.4  8.7 - 10.4 mg/dL Final    Albumin 32/20/2542 3.9  3.4 - 5.0 g/dL Final    Total Protein 12/18/2023 6.6  5.7 - 8.2 g/dL Final    Total Bilirubin 12/18/2023 0.7  0.3 - 1.2 mg/dL Final    AST 70/62/3762 24  <=34 U/L Final    ALT 12/18/2023 53 (H)  10 - 49 U/L Final    Alkaline Phosphatase 12/18/2023 81  46 - 116 U/L Final    Magnesium  12/18/2023 1.9  1.6 - 2.6 mg/dL Final    Phosphorus 83/15/1761 3.0  2.4 - 5.1 mg/dL Final    WBC 60/73/7106 6.8  3.6 - 11.2 10*9/L Final    RBC 12/18/2023 2.83 (L)  3.95 - 5.13 10*12/L Final    HGB 12/18/2023 10.6 (L)  11.3 - 14.9 g/dL Final    HCT 26/94/8546 30.2 (L)  34.0 - 44.0 % Final    MCV 12/18/2023 106.9 (H)  77.6 - 95.7 fL Final    MCH 12/18/2023 37.3 (H)  25.9 - 32.4 pg Final    MCHC 12/18/2023 34.9  32.0 - 36.0 g/dL Final    RDW 27/09/5007 29.6 (H)  12.2 - 15.2 % Final    MPV 12/18/2023 8.2  6.8 - 10.7 fL Final    Platelet 12/18/2023 178  150 - 450 10*9/L Final    Neutrophils % 12/18/2023 72.5  % Final    Lymphocytes % 12/18/2023 19.6  % Final    Monocytes % 12/18/2023 6.3  % Final    Eosinophils % 12/18/2023 1.3  % Final    Basophils % 12/18/2023 0.3  % Final    Absolute Neutrophils 12/18/2023 4.9  1.8 - 7.8 10*9/L Final    Absolute Lymphocytes 12/18/2023 1.3  1.1 - 3.6 10*9/L Final    Absolute Monocytes 12/18/2023 0.4  0.3 - 0.8 10*9/L Final    Absolute Eosinophils 12/18/2023 0.1  0.0 - 0.5 10*9/L Final    Absolute Basophils 12/18/2023 0.0  0.0 - 0.1 10*9/L Final    Macrocytosis 12/18/2023 Slight (A)  Not Present Final    Anisocytosis 12/18/2023 Marked (A)  Not Present Final

## 2023-12-18 NOTE — Unmapped (Signed)
 Patient arrived to room 10 for LP w/ IT chemo. Consent obtained by APP and witnessed by RN. Timeout completed. 2mg  versed  given. LP completed. Pain had some sacral pain after LP. 1x 50mg  PO tramadol given with good results. Patient discharged after lying flat for 1hr via wheelchair.

## 2023-12-18 NOTE — Unmapped (Signed)
 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)      Acute cough    Dyspepsia    Chronic frontal sinusitis    Pancytopenia (CMS-HCC)    Neutropenia with fever (CMS-HCC)    Anxiety    Hypogammaglobulinemia (HHS-HCC)    Hypokalemia    Acute tonsillitis    Allergic conjunctivitis    Chest pain with low risk for cardiac etiology    Chronic back pain    Chronic pansinusitis    Depression    Dog bite    Generalized anxiety disorder    Heart palpitations    Hydronephrosis, right    Hypertension    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 All questions were answered. Consent was obtained and witnessed.    A time-out in which Her patient identifiers were checked by 2 providers was performed.    The patient was positioned under sterile conditions. Betadine solution and sterile drapes were utilized. Local anesthesia with 1% lidocaine  was applied subcutaneously then deep to the skin. A spinal needle was inserted at the L3 - L4 interspace. Spinal fluid was obtained and sent to the laboratory.    Intrathecal ara-C was administered over 4 min.    The spinal needle with trocar was removed with minimal bleeding noted upon removal. A sterile bandage was placed over the puncture site after holding pressure.        Findings  6mL of clear spinal fluid was obtained.    Complications:  None; patient tolerated the procedure well.          Condition: stable    Plan  Bed rest for 1 hours.  Tylenol  650 mg for pain.  Call office if you develop a severe headache, nausea, vomiting, or fever greater than 100.5 F.    Robbin Chill, MSN, FNP-BC, [facility]  Nurse Practitioner  [facility]

## 2023-12-18 NOTE — Unmapped (Signed)
 Patient to chair 4 for port needle/dressing change, lab draw, blincyto  infusion change and IT chemotherapy release. Labs obtained and sent. Port deaccessed and reaccessed with positive blood return. Labs resulted, treatment plans released. Blincyto  infusion connected to patient via CADD pump. Patient to LP with IT chemo in stable condition.

## 2023-12-20 ENCOUNTER — Inpatient Hospital Stay: Admit: 2023-12-20 | Discharge: 2023-12-21 | Payer: PRIVATE HEALTH INSURANCE

## 2023-12-20 LAB — COMPREHENSIVE METABOLIC PANEL
ALBUMIN: 3.7 g/dL (ref 3.4–5.0)
ALKALINE PHOSPHATASE: 77 U/L (ref 46–116)
ALT (SGPT): 35 U/L (ref 10–49)
ANION GAP: 9 mmol/L (ref 5–14)
AST (SGOT): 16 U/L (ref <=34)
BILIRUBIN TOTAL: 0.6 mg/dL (ref 0.3–1.2)
BLOOD UREA NITROGEN: 19 mg/dL (ref 9–23)
BUN / CREAT RATIO: 30
CALCIUM: 8.9 mg/dL (ref 8.7–10.4)
CHLORIDE: 107 mmol/L (ref 98–107)
CO2: 25 mmol/L (ref 20.0–31.0)
CREATININE: 0.63 mg/dL (ref 0.55–1.02)
EGFR CKD-EPI (2021) FEMALE: >90 mL/min/1.73m2 (ref >=60)
GLUCOSE RANDOM: 260 mg/dL — ABNORMAL HIGH (ref 70–179)
POTASSIUM: 3.7 mmol/L (ref 3.4–4.8)
PROTEIN TOTAL: 6.5 g/dL (ref 5.7–8.2)
SODIUM: 141 mmol/L (ref 135–145)

## 2023-12-20 LAB — CBC W/ AUTO DIFF
BASOPHILS ABSOLUTE COUNT: 0 10*9/L (ref 0.0–0.1)
BASOPHILS RELATIVE PERCENT: 0.2 %
EOSINOPHILS ABSOLUTE COUNT: 0.1 10*9/L (ref 0.0–0.5)
EOSINOPHILS RELATIVE PERCENT: 1.3 %
HEMATOCRIT: 28.9 % — ABNORMAL LOW (ref 34.0–44.0)
HEMOGLOBIN: 9.9 g/dL — ABNORMAL LOW (ref 11.3–14.9)
LYMPHOCYTES ABSOLUTE COUNT: 1.1 10*9/L (ref 1.1–3.6)
LYMPHOCYTES RELATIVE PERCENT: 17 %
MEAN CORPUSCULAR HEMOGLOBIN CONC: 34.3 g/dL (ref 32.0–36.0)
MEAN CORPUSCULAR HEMOGLOBIN: 36.9 pg — ABNORMAL HIGH (ref 25.9–32.4)
MEAN CORPUSCULAR VOLUME: 107.6 fL — ABNORMAL HIGH (ref 77.6–95.7)
MEAN PLATELET VOLUME: 8 fL (ref 6.8–10.7)
MONOCYTES ABSOLUTE COUNT: 0.6 10*9/L (ref 0.3–0.8)
MONOCYTES RELATIVE PERCENT: 9.2 %
NEUTROPHILS ABSOLUTE COUNT: 4.8 10*9/L (ref 1.8–7.8)
NEUTROPHILS RELATIVE PERCENT: 72.3 %
PLATELET COUNT: 214 10*9/L (ref 150–450)
RED BLOOD CELL COUNT: 2.69 10*12/L — ABNORMAL LOW (ref 3.95–5.13)
RED CELL DISTRIBUTION WIDTH: 27.5 % — ABNORMAL HIGH (ref 12.2–15.2)
WBC ADJUSTED: 6.7 10*9/L (ref 3.6–11.2)

## 2023-12-20 LAB — MAGNESIUM: MAGNESIUM: 1.8 mg/dL (ref 1.6–2.6)

## 2023-12-20 LAB — C-REACTIVE PROTEIN: C-REACTIVE PROTEIN: 9.1 mg/L (ref <=10.0)

## 2023-12-20 LAB — PHOSPHORUS: PHOSPHORUS: 2.8 mg/dL (ref 2.4–5.1)

## 2023-12-20 MED ADMIN — blinatumomab (BLINCYTO) 28 mcg/day in sodium chloride (non-PVC) 0.9 % IVPB (7-DAY CADD INFUSION): 28 ug/d | INTRAVENOUS | @ 15:00:00 | Stop: 2023-12-20

## 2023-12-20 NOTE — Unmapped (Signed)
 RED ZONE Means: RED ZONE: Take action now!     You need to be seen right away  Symptoms are at a severe level of discomfort    Call 911 or go to your nearest  Hospital for help     - Bleeding that will not stop    - Hard to breathe    - New seizure - Chest pain  - Fall or passing out  -Thoughts of hurting    yourself or others      Call 911 if you are going into the RED ZONE                  YELLOW ZONE Means:     Please call with any new or worsening symptom(s), even if not on this list.  Call [phone]  After hours, weekends, and holidays - you will reach a long recording with specific instructions, If not in an emergency such as above, please listen closely all the way to the end and choose the option that relates to your need.   You can be seen by a provider the same day through our Same Day Acute Care for Patients with Cancer program.      YELLOW ZONE: Take action today     Symptoms are new or worsening  You are not within your goal range for:    - Pain    - Shortness of breath    - Bleeding (nose, urine, stool, wound)    - Feeling sick to your stomach and throwing up    - Mouth sores/pain in your mouth or throat    - Hard stool or very loose stools (increase in       ostomy output)    - No urine for 12 hours    - Feeding tube or other catheter/tube issue    - Redness or pain at previous IV or port/catheter site    - Depressed or anxiety   - Swelling (leg, arm, abdomen,     face, neck)  - Skin rash or skin changes  - Wound issues (redness, drainage,    re-opened)  - Confusion  - Vision changes  - Fever >100.4 F or chills  - Worsening cough with mucus that is    green, yellow, or bloody  - Pain or burning when going to the    bathroom  - Home Infusion Pump Issue- call    [phone]         Call your healthcare provider if you are going into the YELLOW ZONE     GREEN ZONE Means:  Your symptoms are under controls  Continue to take your medicine as ordered  Keep all visits to the provider GREEN ZONE: You are in control  No increase or worsening symptoms  Able to take your medicine  Able to drink and eat    - DO NOT use MyChart messages to report red or yellow symptoms. Allow up to 3    business days for a reply.  -MyChart is for non-urgent medication refills, scheduling requests, or other general questions.         [id number] Rev. 01/28/2022  Approved by Oncology Patient Education Committee     Hospital Outpatient Visit on 12/20/2023   Component Date Value Ref Range Status    Sodium 12/20/2023 141  135 - 145 mmol/L Final    Potassium 12/20/2023 3.7  3.4 - 4.8 mmol/L Final    Chloride 12/20/2023 107  98 - 107  mmol/L Final    CO2 12/20/2023 25.0  20.0 - 31.0 mmol/L Final    Anion Gap 12/20/2023 9  5 - 14 mmol/L Final    BUN 12/20/2023 19  9 - 23 mg/dL Final    Creatinine 09/60/4540 0.63  0.55 - 1.02 mg/dL Final    BUN/Creatinine Ratio 12/20/2023 30   Final    eGFR CKD-EPI (2021) Female 12/20/2023 >90  >=60 mL/min/1.19m2 Final    eGFR calculated with CKD-EPI 2021 equation in accordance with [employer] and [employer] of Nephrology Task Force recommendations.    Glucose 12/20/2023 260 (H)  70 - 179 mg/dL Final    Calcium  12/20/2023 8.9  8.7 - 10.4 mg/dL Final    Albumin 98/06/9146 3.7  3.4 - 5.0 g/dL Final    Total Protein 12/20/2023 6.5  5.7 - 8.2 g/dL Final    Total Bilirubin 12/20/2023 0.6  0.3 - 1.2 mg/dL Final    AST 82/95/6213 16  <=34 U/L Final    ALT 12/20/2023 35  10 - 49 U/L Final    Alkaline Phosphatase 12/20/2023 77  46 - 116 U/L Final    Magnesium  12/20/2023 1.8  1.6 - 2.6 mg/dL Final    Phosphorus 08/65/7846 2.8  2.4 - 5.1 mg/dL Final    CRP 96/29/5284 9.1  <=10.0 mg/L Final    WBC 12/20/2023 6.7  3.6 - 11.2 10*9/L Final    RBC 12/20/2023 2.69 (L)  3.95 - 5.13 10*12/L Final    HGB 12/20/2023 9.9 (L)  11.3 - 14.9 g/dL Final    HCT 13/24/4010 28.9 (L)  34.0 - 44.0 % Final    MCV 12/20/2023 107.6 (H)  77.6 - 95.7 fL Final    MCH 12/20/2023 36.9 (H)  25.9 - 32.4 pg Final    MCHC 12/20/2023 34.3  32.0 - 36.0 g/dL Final    RDW 27/25/3664 27.5 (H)  12.2 - 15.2 % Final    MPV 12/20/2023 8.0  6.8 - 10.7 fL Final    Platelet 12/20/2023 214  150 - 450 10*9/L Final    Neutrophils % 12/20/2023 72.3  % Final    Lymphocytes % 12/20/2023 17.0  % Final    Monocytes % 12/20/2023 9.2  % Final    Eosinophils % 12/20/2023 1.3  % Final    Basophils % 12/20/2023 0.2  % Final    Absolute Neutrophils 12/20/2023 4.8  1.8 - 7.8 10*9/L Final    Absolute Lymphocytes 12/20/2023 1.1  1.1 - 3.6 10*9/L Final    Absolute Monocytes 12/20/2023 0.6  0.3 - 0.8 10*9/L Final    Absolute Eosinophils 12/20/2023 0.1  0.0 - 0.5 10*9/L Final    Absolute Basophils 12/20/2023 0.0  0.0 - 0.1 10*9/L Final    Macrocytosis 12/20/2023 Slight (A)  Not Present Final    Anisocytosis 12/20/2023 Marked (A)  Not Present Final

## 2023-12-20 NOTE — Unmapped (Signed)
 Patient to chair 12, accompanied by husband. VSS, afebrile. Endorses mild headache, neuro otherwise intact. Double lumen port patent, blood return noted x2. Labs drawn. Next dreaaing change 5/26 per chart. Blina infusing through CADD pump. Alls questions answered. Declined AVS. Escorted by husband via wheelchair at discharge. Plan of care reviewed, safety maintained.

## 2023-12-21 DIAGNOSIS — C9100 Acute lymphoblastic leukemia not having achieved remission: Principal | ICD-10-CM

## 2023-12-22 DIAGNOSIS — C9100 Acute lymphoblastic leukemia not having achieved remission: Principal | ICD-10-CM

## 2023-12-26 DIAGNOSIS — C9100 Acute lymphoblastic leukemia not having achieved remission: Principal | ICD-10-CM

## 2023-12-27 ENCOUNTER — Ambulatory Visit: Admit: 2023-12-27 | Discharge: 2023-12-27 | Payer: PRIVATE HEALTH INSURANCE

## 2023-12-27 ENCOUNTER — Inpatient Hospital Stay: Admit: 2023-12-27 | Discharge: 2023-12-27 | Payer: PRIVATE HEALTH INSURANCE

## 2023-12-27 ENCOUNTER — Inpatient Hospital Stay
Admit: 2023-12-27 | Discharge: 2023-12-27 | Payer: PRIVATE HEALTH INSURANCE | Attending: Student in an Organized Health Care Education/Training Program | Primary: Student in an Organized Health Care Education/Training Program

## 2023-12-27 DIAGNOSIS — C9100 Acute lymphoblastic leukemia not having achieved remission: Principal | ICD-10-CM

## 2023-12-27 LAB — CBC W/ AUTO DIFF
BASOPHILS ABSOLUTE COUNT: 0 10*9/L (ref 0.0–0.1)
BASOPHILS RELATIVE PERCENT: 0.6 %
EOSINOPHILS ABSOLUTE COUNT: 0.1 10*9/L (ref 0.0–0.5)
EOSINOPHILS RELATIVE PERCENT: 1.4 %
HEMATOCRIT: 32.6 % — ABNORMAL LOW (ref 34.0–44.0)
HEMOGLOBIN: 11.5 g/dL (ref 11.3–14.9)
LYMPHOCYTES ABSOLUTE COUNT: 1.4 10*9/L (ref 1.1–3.6)
LYMPHOCYTES RELATIVE PERCENT: 29.6 %
MEAN CORPUSCULAR HEMOGLOBIN CONC: 35.1 g/dL (ref 32.0–36.0)
MEAN CORPUSCULAR HEMOGLOBIN: 37.7 pg — ABNORMAL HIGH (ref 25.9–32.4)
MEAN CORPUSCULAR VOLUME: 107.4 fL — ABNORMAL HIGH (ref 77.6–95.7)
MEAN PLATELET VOLUME: 7.4 fL (ref 6.8–10.7)
MONOCYTES ABSOLUTE COUNT: 0.8 10*9/L (ref 0.3–0.8)
MONOCYTES RELATIVE PERCENT: 18.1 %
NEUTROPHILS ABSOLUTE COUNT: 2.3 10*9/L (ref 1.8–7.8)
NEUTROPHILS RELATIVE PERCENT: 50.3 %
PLATELET COUNT: 248 10*9/L (ref 150–450)
RED BLOOD CELL COUNT: 3.04 10*12/L — ABNORMAL LOW (ref 3.95–5.13)
RED CELL DISTRIBUTION WIDTH: 25.8 % — ABNORMAL HIGH (ref 12.2–15.2)
WBC ADJUSTED: 4.6 10*9/L (ref 3.6–11.2)

## 2023-12-27 LAB — COMPREHENSIVE METABOLIC PANEL
ALBUMIN: 4 g/dL (ref 3.4–5.0)
ALKALINE PHOSPHATASE: 70 U/L (ref 46–116)
ALT (SGPT): 30 U/L (ref 10–49)
ANION GAP: 12 mmol/L (ref 5–14)
AST (SGOT): 31 U/L (ref <=34)
BILIRUBIN TOTAL: 0.6 mg/dL (ref 0.3–1.2)
BLOOD UREA NITROGEN: 19 mg/dL (ref 9–23)
BUN / CREAT RATIO: 36
CALCIUM: 9.6 mg/dL (ref 8.7–10.4)
CHLORIDE: 102 mmol/L (ref 98–107)
CO2: 25 mmol/L (ref 20.0–31.0)
CREATININE: 0.53 mg/dL — ABNORMAL LOW (ref 0.55–1.02)
EGFR CKD-EPI (2021) FEMALE: >90 mL/min/1.73m2 (ref >=60)
GLUCOSE RANDOM: 301 mg/dL — ABNORMAL HIGH (ref 70–179)
POTASSIUM: 4.6 mmol/L (ref 3.4–4.8)
PROTEIN TOTAL: 6.6 g/dL (ref 5.7–8.2)
SODIUM: 139 mmol/L (ref 135–145)

## 2023-12-27 LAB — PHOSPHORUS: PHOSPHORUS: 3 mg/dL (ref 2.4–5.1)

## 2023-12-27 LAB — HEMATOPATHOLOGY LEUKEMIA/LYMPHOMA FLOW CYTOMETRY, CSF
LYMPHS CSF: 92 %
MONO/MACROPHAGE CSF: 7 %
NUCLEATED CELLS, CSF: 15 ul — ABNORMAL HIGH (ref <=5)
NUMBER OF CELLS CSF: 100
OTHER  CELLS CSF: 1 %
RBC CSF: 16 ul — ABNORMAL HIGH (ref <2)

## 2023-12-27 LAB — SLIDE REVIEW

## 2023-12-27 LAB — C-REACTIVE PROTEIN: C-REACTIVE PROTEIN: 7.4 mg/L (ref <=10.0)

## 2023-12-27 LAB — MAGNESIUM: MAGNESIUM: 1.8 mg/dL (ref 1.6–2.6)

## 2023-12-27 MED ADMIN — midazolam (VERSED) injection 2 mg: 2 mg | INTRAVENOUS | @ 20:00:00 | Stop: 2023-12-27

## 2023-12-27 MED ADMIN — heparin, porcine (PF) 100 unit/mL injection 500 Units: 500 [IU] | INTRAVENOUS | @ 21:00:00 | Stop: 2023-12-28

## 2023-12-27 MED ADMIN — diphenhydrAMINE (BENADRYL) capsule/tablet 25 mg: 25 mg | ORAL | @ 17:00:00 | Stop: 2023-12-27

## 2023-12-27 MED ADMIN — acetaminophen (TYLENOL) tablet 650 mg: 650 mg | ORAL | @ 17:00:00 | Stop: 2023-12-27

## 2023-12-27 MED ADMIN — blinatumomab (BLINCYTO) 28 mcg/day in sodium chloride (non-PVC) 0.9 % IVPB (7-DAY CADD INFUSION): 28 ug/d | INTRAVENOUS | @ 19:00:00 | Stop: 2023-12-27

## 2023-12-27 MED ADMIN — immun glob G(IgG)-pro-IgA 0-50 (PRIVIGEN) 10 % intravenous solution 30 g: .4 g/kg | INTRAVENOUS | @ 17:00:00 | Stop: 2023-12-27

## 2023-12-27 MED ADMIN — methotrexate (Preservative Free) 12 mg, hydrocortisone sod succ (Solu-CORTEF) 50 mg in sodium chloride (NS) 0.9 % 6 mL INTRATHECAL syringe: INTRATHECAL | @ 20:00:00 | Stop: 2023-12-27

## 2023-12-27 MED ADMIN — traMADol (ULTRAM) tablet 50 mg: 50 mg | ORAL | @ 20:00:00 | Stop: 2023-12-27

## 2023-12-27 NOTE — Unmapped (Deleted)
 Bone Marrow Transplant and Cellular Therapy Program    [name]  MRN: [id number]  Encounter Date: 12/27/2023      HPI:  CLARY BOULAIS is a 48 y.o. 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     Vital signs:  Vitals:    12/27/23 1202   BP: 120/97   Pulse: 108   Resp: 18   Temp: 36.9 ??C (98.4 ??F)   TempSrc: Oral   Weight: 88.7 kg (195 lb 10.5 oz)       CRS:   - Will begin monitoring for Cytokine release syndrome following CAR-T infusion.  Median time to onset of similar products is 2-7 days following infusion. If fever, hypotension or hypoxia develops, page BMT provider and initiate CRS orderset appropriate for CRS grade.  Fever: No.  Rash: No.  Myalgias: No.  Hypotension: No.  Hypoxia: No.  Nausea and/or vomiting: No.     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 ???Close your eyes and stick out your tongue.??? 1/1 [employer] Ability to write a standard sentence (eg ???Our national bird is the Human resources officer??? 1/1 Point   Attention Ability to count backwards from 100 by 10 1/1 Point      Total ***/10 points     ICANS Grade: Grade 0, Full 10 points   - ***No current evidence of ICANS.  If develops any new neurologic symptoms, page BMT provider. Guidelines on Intranet.    Courtany Mcmurphy [place], [place]    New CAR-T issues:  ** Call the BMT clinic M-F 8a-4p and ask to speak to an APP, After-hours and weekends call the [facility] and ask to speak to the on-call provider.       **CRS and Neurotoxicity Guidelines: Intranet -> Depts -> Bone Marrow Transplant -> Cellular Therapy Resources  -----------------------------------------------------------------------------------------------------------  CLINICAL CRS admission: (Please initiate CRS order set in EPIC)  *For ANY Neurologic symptoms, see CAR-T Neurotoxicity Guidelines on intranet

## 2023-12-27 NOTE — Unmapped (Signed)
 [name]  MRN: [id number]  Encounter Date: 12/27/2023    Vital signs:  Vitals:    12/27/23 1202   BP: 120/97   Pulse: 108   Resp: 18   Temp: 36.9 ??C (98.4 ??F)   TempSrc: Oral   Weight: 88.7 kg (195 lb 10.5 oz)       CRS:   Fever: No.  Rash: No.  Myalgias: No.  Hypotension: No.  Hypoxia: No.  Nausea and/or vomiting: No.     Neurotoxicity:   Immune Effector Cell-Associated Encephalopathy (ICE) Score  Orientation Year, Month, City, Hospital 4/4 Points   Naming Ability to name 3 objects (eg point to clock, pen, button) 3/3 Points   Follow Commands Ability to follow simple commands (eg ???Show me 2 fingers,??? ???Close your eyes and stick out your tongue.??? 1/1 [employer] Ability to write a standard sentence (eg ???Our national bird is the Human resources officer??? 1/1 Point   Attention Ability to count backwards from 100 by 10 1/1 Point      Total 10/10 points     Ok to proceed with blinatumomab  infusion today.     Care team was notified about patients temperatures since starting treatment 5/21 temp got up to 100 but is occasionally at 99 F. Patient afebrile today. Has had occasional loose stools with some diarrhea, however this is improving. Overall feels well and is doing good.   [address] [place], [place]

## 2023-12-27 NOTE — Unmapped (Signed)
 If you have questions or concerns, you may call the Nurse call line at [phone].    Hospital Outpatient Visit on 12/27/2023   Component Date Value Ref Range Status    Protein, CSF 12/27/2023 71 (H)  20 - 59 mg/dL Final    Glucose, CSF 09/81/1914 122 (H)  48 - 79 mg/dL Final    Tube # CSF 78/29/5621 Tube 1   Preliminary    Color, CSF 12/27/2023 Colorless   Preliminary    Appearance, CSF 12/27/2023 Clear   Preliminary    Nucleated Cells, CSF 12/27/2023 15 (H)  <=5 ul Preliminary    RBC, CSF 12/27/2023 16 (H)  <2 ul Preliminary   Hospital Outpatient Visit on 12/27/2023   Component Date Value Ref Range Status    Sodium 12/27/2023 139  135 - 145 mmol/L Final    Potassium 12/27/2023 4.6  3.4 - 4.8 mmol/L Final    Chloride 12/27/2023 102  98 - 107 mmol/L Final    CO2 12/27/2023 25.0  20.0 - 31.0 mmol/L Final    Anion Gap 12/27/2023 12  5 - 14 mmol/L Final    BUN 12/27/2023 19  9 - 23 mg/dL Final    Creatinine 30/86/5784 0.53 (L)  0.55 - 1.02 mg/dL Final    BUN/Creatinine Ratio 12/27/2023 36   Final    eGFR CKD-EPI (2021) Female 12/27/2023 >90  >=60 mL/min/1.87m2 Final    eGFR calculated with CKD-EPI 2021 equation in accordance with [employer] and [employer] of Nephrology Task Force recommendations.    Glucose 12/27/2023 301 (H)  70 - 179 mg/dL Final    Calcium  12/27/2023 9.6  8.7 - 10.4 mg/dL Final    Albumin 69/62/9528 4.0  3.4 - 5.0 g/dL Final    Total Protein 12/27/2023 6.6  5.7 - 8.2 g/dL Final    Total Bilirubin 12/27/2023 0.6  0.3 - 1.2 mg/dL Final    AST 41/32/4401 31  <=34 U/L Final    ALT 12/27/2023 30  10 - 49 U/L Final    Alkaline Phosphatase 12/27/2023 70  46 - 116 U/L Final    Magnesium  12/27/2023 1.8  1.6 - 2.6 mg/dL Final    Phosphorus 02/72/5366 3.0  2.4 - 5.1 mg/dL Final    CRP 44/09/4740 7.4  <=10.0 mg/L Final    WBC 12/27/2023 4.6  3.6 - 11.2 10*9/L Final    RBC 12/27/2023 3.04 (L)  3.95 - 5.13 10*12/L Final    HGB 12/27/2023 11.5  11.3 - 14.9 g/dL Final    HCT 59/56/3875 32.6 (L)  34.0 - 44.0 % Final    MCV 12/27/2023 107.4 (H)  77.6 - 95.7 fL Final    MCH 12/27/2023 37.7 (H)  25.9 - 32.4 pg Final    MCHC 12/27/2023 35.1  32.0 - 36.0 g/dL Final    RDW 64/33/2951 25.8 (H)  12.2 - 15.2 % Final    MPV 12/27/2023 7.4  6.8 - 10.7 fL Final    Platelet 12/27/2023 248  150 - 450 10*9/L Final    Neutrophils % 12/27/2023 50.3  % Final    Lymphocytes % 12/27/2023 29.6  % Final    Monocytes % 12/27/2023 18.1  % Final    Eosinophils % 12/27/2023 1.4  % Final    Basophils % 12/27/2023 0.6  % Final    Absolute Neutrophils 12/27/2023 2.3  1.8 - 7.8 10*9/L Final    Absolute Lymphocytes 12/27/2023 1.4  1.1 - 3.6 10*9/L Final    Absolute  Monocytes 12/27/2023 0.8  0.3 - 0.8 10*9/L Final    Absolute Eosinophils 12/27/2023 0.1  0.0 - 0.5 10*9/L Final    Absolute Basophils 12/27/2023 0.0  0.0 - 0.1 10*9/L Final    Macrocytosis 12/27/2023 Slight (A)  Not Present Final    Anisocytosis 12/27/2023 Marked (A)  Not Present Final    Smear Review Comments 12/27/2023 See Comment (A)  Undefined Final    Slide [id number] reviewed    Polychromasia 12/27/2023 Slight (A)  Not Present Final    Basophilic Stippling 12/27/2023 Present (A)  Not Present Final    Neutrophil Left Shift 12/27/2023 Present (A)  Not Present Final

## 2023-12-27 NOTE — Unmapped (Addendum)
 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)      Acute cough    Dyspepsia    Chronic frontal sinusitis    Pancytopenia (CMS-HCC)    Neutropenia with fever (CMS-HCC)    Anxiety    Hypogammaglobulinemia (HHS-HCC)    Hypokalemia    Acute tonsillitis    Allergic conjunctivitis    Chest pain with low risk for cardiac etiology    Chronic back pain    Chronic pansinusitis    Depression    Dog bite    Generalized anxiety disorder    Heart palpitations    Hydronephrosis, right    Hypertension    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 Chemotherapy induced neutropenia  D70.1 midazolam  (VERSED ) injection 2 mg    T45.1X5A traMADol  (ULTRAM ) tablet 50 mg      2. 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 All questions were answered. Consent was obtained and witnessed.    A time-out in which Her patient identifiers were checked by 2 providers was performed.    The patient was positioned under sterile conditions. Betadine solution and sterile drapes were utilized. Local anesthesia with 1% lidocaine  was applied subcutaneously then deep to the skin. A spinal needle was inserted at the L3 - L4 interspace. Spinal fluid was obtained and sent to the laboratory.    Intrathecal MTX was administered over 4 min.    The spinal needle with trocar was removed with minimal bleeding noted upon removal. A sterile bandage was placed over the puncture site after holding pressure.    Patient is better able to tolerate side lying lumbar puncture.     Findings  6mL of clear spinal fluid was obtained.    Complications:  None; patient tolerated the procedure well.          Condition: stable    Patient required 50 mg of Tramadol  post procedure for some tailbone pain.     Plan  Bed rest for 1 hours.  Tylenol  650 mg for pain.  Call office if you develop a severe headache, nausea, vomiting, or fever greater than 100.5 F.

## 2023-12-27 NOTE — Unmapped (Signed)
 Patient arrived to chair 9.  No complaints noted.  Blina CADD infusion from lateral port discontinued.  No forward flush performed, + BR noted.  Labs drawn.  Both medial and lateral port deaccessed with + BR prior.  Both medial and lateral port re accessed with + BR and dressed with CHG dressing and mepore.    IVIG pre meds given and pt tolerated IVIG titration to completion.  VS WNL throughout infusion.  Lateral port where IVIG was infusing saline locked with + BR prior.    Pt hooked up to Blinatumomab  infusion via CADD pump on medial port per orders.      Post LP, pt on flat bed rest for 1hr (til 1715).  Pt given 50mg  Tramadol  post LP.  CADD pump running correctly at time of discharge.  Lateral port heparin  locked with + BR prior.  Pt declined lightheadedness/dizziness when bedrest was over.  Pt discharged from clinic ambulatory to home with spouse.

## 2023-12-27 NOTE — Unmapped (Signed)
 RED ZONE Means: RED ZONE: Take action now!     You need to be seen right away  Symptoms are at a severe level of discomfort    Call 911 or go to your nearest  Hospital for help     - Bleeding that will not stop    - Hard to breathe    - New seizure - Chest pain  - Fall or passing out  -Thoughts of hurting    yourself or others      Call 911 if you are going into the RED ZONE                  YELLOW ZONE Means:     Please call with any new or worsening symptom(s), even if not on this list.  Call [phone]  After hours, weekends, and holidays - you will reach a long recording with specific instructions, If not in an emergency such as above, please listen closely all the way to the end and choose the option that relates to your need.   You can be seen by a provider the same day through our Same Day Acute Care for Patients with Cancer program.      YELLOW ZONE: Take action today     Symptoms are new or worsening  You are not within your goal range for:    - Pain    - Shortness of breath    - Bleeding (nose, urine, stool, wound)    - Feeling sick to your stomach and throwing up    - Mouth sores/pain in your mouth or throat    - Hard stool or very loose stools (increase in       ostomy output)    - No urine for 12 hours    - Feeding tube or other catheter/tube issue    - Redness or pain at previous IV or port/catheter site    - Depressed or anxiety   - Swelling (leg, arm, abdomen,     face, neck)  - Skin rash or skin changes  - Wound issues (redness, drainage,    re-opened)  - Confusion  - Vision changes  - Fever >100.4 F or chills  - Worsening cough with mucus that is    green, yellow, or bloody  - Pain or burning when going to the    bathroom  - Home Infusion Pump Issue- call    [phone]         Call your healthcare provider if you are going into the YELLOW ZONE     GREEN ZONE Means:  Your symptoms are under controls  Continue to take your medicine as ordered  Keep all visits to the provider GREEN ZONE: You are in control  No increase or worsening symptoms  Able to take your medicine  Able to drink and eat    - DO NOT use MyChart messages to report red or yellow symptoms. Allow up to 3    business days for a reply.  -MyChart is for non-urgent medication refills, scheduling requests, or other general questions.         [id number] Rev. 01/28/2022  Approved by Oncology Patient Education Committee     Hospital Outpatient Visit on 12/27/2023   Component Date Value Ref Range Status    Tube # CSF 12/27/2023 Tube 1   Preliminary    Color, CSF 12/27/2023 Colorless   Preliminary    Appearance, CSF 12/27/2023 Clear   Preliminary  Nucleated Cells, CSF 12/27/2023 15 (H)  <=5 ul Preliminary    RBC, CSF 12/27/2023 16 (H)  <2 ul Preliminary   Hospital Outpatient Visit on 12/27/2023   Component Date Value Ref Range Status    Sodium 12/27/2023 139  135 - 145 mmol/L Final    Potassium 12/27/2023 4.6  3.4 - 4.8 mmol/L Final    Chloride 12/27/2023 102  98 - 107 mmol/L Final    CO2 12/27/2023 25.0  20.0 - 31.0 mmol/L Final    Anion Gap 12/27/2023 12  5 - 14 mmol/L Final    BUN 12/27/2023 19  9 - 23 mg/dL Final    Creatinine 16/05/9603 0.53 (L)  0.55 - 1.02 mg/dL Final    BUN/Creatinine Ratio 12/27/2023 36   Final    eGFR CKD-EPI (2021) Female 12/27/2023 >90  >=60 mL/min/1.66m2 Final    eGFR calculated with CKD-EPI 2021 equation in accordance with [employer] and [employer] Nephrology Task Force recommendations.    Glucose 12/27/2023 301 (H)  70 - 179 mg/dL Final    Calcium  12/27/2023 9.6  8.7 - 10.4 mg/dL Final    Albumin 54/04/8118 4.0  3.4 - 5.0 g/dL Final    Total Protein 12/27/2023 6.6  5.7 - 8.2 g/dL Final    Total Bilirubin 12/27/2023 0.6  0.3 - 1.2 mg/dL Final    AST 14/78/2956 31  <=34 U/L Final    ALT 12/27/2023 30  10 - 49 U/L Final    Alkaline Phosphatase 12/27/2023 70  46 - 116 U/L Final    Magnesium  12/27/2023 1.8  1.6 - 2.6 mg/dL Final    Phosphorus 21/30/8657 3.0  2.4 - 5.1 mg/dL Final    CRP 84/69/6295 7.4  <=10.0 mg/L Final    WBC 12/27/2023 4.6  3.6 - 11.2 10*9/L Final    RBC 12/27/2023 3.04 (L)  3.95 - 5.13 10*12/L Final    HGB 12/27/2023 11.5  11.3 - 14.9 g/dL Final    HCT 28/41/3244 32.6 (L)  34.0 - 44.0 % Final    MCV 12/27/2023 107.4 (H)  77.6 - 95.7 fL Final    MCH 12/27/2023 37.7 (H)  25.9 - 32.4 pg Final    MCHC 12/27/2023 35.1  32.0 - 36.0 g/dL Final    RDW 08/03/7251 25.8 (H)  12.2 - 15.2 % Final    MPV 12/27/2023 7.4  6.8 - 10.7 fL Final    Platelet 12/27/2023 248  150 - 450 10*9/L Final    Neutrophils % 12/27/2023 50.3  % Final    Lymphocytes % 12/27/2023 29.6  % Final    Monocytes % 12/27/2023 18.1  % Final    Eosinophils % 12/27/2023 1.4  % Final    Basophils % 12/27/2023 0.6  % Final    Absolute Neutrophils 12/27/2023 2.3  1.8 - 7.8 10*9/L Final    Absolute Lymphocytes 12/27/2023 1.4  1.1 - 3.6 10*9/L Final    Absolute Monocytes 12/27/2023 0.8  0.3 - 0.8 10*9/L Final    Absolute Eosinophils 12/27/2023 0.1  0.0 - 0.5 10*9/L Final    Absolute Basophils 12/27/2023 0.0  0.0 - 0.1 10*9/L Final    Macrocytosis 12/27/2023 Slight (A)  Not Present Final    Anisocytosis 12/27/2023 Marked (A)  Not Present Final    Smear Review Comments 12/27/2023 See Comment (A)  Undefined Final    Slide [id number] reviewed    Polychromasia 12/27/2023 Slight (A)  Not Present Final    Basophilic Stippling 12/27/2023 Present (  A)  Not Present Final    Neutrophil Left Shift 12/27/2023 Present (A)  Not Present Final

## 2023-12-29 DIAGNOSIS — C9100 Acute lymphoblastic leukemia not having achieved remission: Principal | ICD-10-CM

## 2024-01-02 DIAGNOSIS — D801 Nonfamilial hypogammaglobulinemia: Principal | ICD-10-CM

## 2024-01-02 DIAGNOSIS — C9100 Acute lymphoblastic leukemia not having achieved remission: Principal | ICD-10-CM

## 2024-01-02 NOTE — Unmapped (Signed)
 [facility] Refill Coordination Note    Specialty Medication(s) to be Shipped:   Hematology/Oncology: Iclusig     Other medication(s) to be shipped: No additional medications requested for fill at this time     Patricia [name], DOB: 03/24/1976  Phone: [phone] (home)       All above HIPAA information was verified with patient.     Was a Nurse, learning disability used for this call? No    Completed refill call assessment today to schedule patient's medication shipment from the [facility] [place] [facility]  [phone]).  All relevant notes have been reviewed.     Specialty medication(s) and dose(s) confirmed: Regimen is correct and unchanged.   Changes to medications: Tifany reports starting the following medications: B[place]cto  Changes to insurance: No  New side effects reported not previously addressed with a pharmacist or physician: None reported  Questions for the pharmacist: No    Confirmed patient received a Conservation officer, historic buildings and a Surveyor, mining with first shipment. The patient will receive a drug information handout for each medication shipped and additional FDA Medication Guides as required.       DISEASE/MEDICATION-SPECIFIC INFORMATION        N/A    SPECIALTY MEDICATION ADHERENCE     Medication Adherence    Patient reported X missed doses in the last month: 0  Specialty Medication: Iclusig  30mg   Patient is on additional specialty medications: No  Informant: patient     Were doses missed due to medication being on hold? No    Iclusig  30 mg: 10 days of medicine on hand       REFERRAL TO PHARMACIST     Referral to the pharmacist: Not needed      [facility]     Shipping address confirmed in Epic.     Cost and Payment: Unable to determine copay at this time as the prescription requires a prior authorization/financial assistance. Patient is aware that shipment will be held until copay has been approved and payment information collected, if needed.    Delivery Scheduled: Yes, Expected medication delivery date: 01/09/24.     Medication will be delivered via Next Day Courier to the prescription address in [employer] [place].    Maven Rosander M Santer Torres   [facility]  Specialty Technician

## 2024-01-03 ENCOUNTER — Ambulatory Visit: Admit: 2024-01-03 | Discharge: 2024-01-04 | Payer: PRIVATE HEALTH INSURANCE

## 2024-01-03 ENCOUNTER — Inpatient Hospital Stay: Admit: 2024-01-03 | Discharge: 2024-01-04 | Payer: PRIVATE HEALTH INSURANCE

## 2024-01-03 ENCOUNTER — Inpatient Hospital Stay
Admit: 2024-01-03 | Discharge: 2024-01-04 | Payer: PRIVATE HEALTH INSURANCE | Attending: Student in an Organized Health Care Education/Training Program | Primary: Student in an Organized Health Care Education/Training Program

## 2024-01-03 DIAGNOSIS — C9100 Acute lymphoblastic leukemia not having achieved remission: Principal | ICD-10-CM

## 2024-01-03 LAB — CBC W/ AUTO DIFF
BASOPHILS ABSOLUTE COUNT: 0 10*9/L (ref 0.0–0.1)
BASOPHILS RELATIVE PERCENT: 0.4 %
EOSINOPHILS ABSOLUTE COUNT: 0 10*9/L (ref 0.0–0.5)
EOSINOPHILS RELATIVE PERCENT: 0.3 %
HEMATOCRIT: 30.6 % — ABNORMAL LOW (ref 34.0–44.0)
HEMOGLOBIN: 10.5 g/dL — ABNORMAL LOW (ref 11.3–14.9)
LYMPHOCYTES ABSOLUTE COUNT: 1.6 10*9/L (ref 1.1–3.6)
LYMPHOCYTES RELATIVE PERCENT: 19.3 %
MEAN CORPUSCULAR HEMOGLOBIN CONC: 34.5 g/dL (ref 32.0–36.0)
MEAN CORPUSCULAR HEMOGLOBIN: 37 pg — ABNORMAL HIGH (ref 25.9–32.4)
MEAN CORPUSCULAR VOLUME: 107.4 fL — ABNORMAL HIGH (ref 77.6–95.7)
MEAN PLATELET VOLUME: 8.8 fL (ref 6.8–10.7)
MONOCYTES ABSOLUTE COUNT: 0.8 10*9/L (ref 0.3–0.8)
MONOCYTES RELATIVE PERCENT: 9.2 %
NEUTROPHILS ABSOLUTE COUNT: 5.9 10*9/L (ref 1.8–7.8)
NEUTROPHILS RELATIVE PERCENT: 70.8 %
PLATELET COUNT: 133 10*9/L — ABNORMAL LOW (ref 150–450)
RED BLOOD CELL COUNT: 2.85 10*12/L — ABNORMAL LOW (ref 3.95–5.13)
RED CELL DISTRIBUTION WIDTH: 22.6 % — ABNORMAL HIGH (ref 12.2–15.2)
WBC ADJUSTED: 8.3 10*9/L (ref 3.6–11.2)

## 2024-01-03 LAB — COMPREHENSIVE METABOLIC PANEL
ALBUMIN: 3.8 g/dL (ref 3.4–5.0)
ALKALINE PHOSPHATASE: 73 U/L (ref 46–116)
ALT (SGPT): 27 U/L (ref 10–49)
ANION GAP: 8 mmol/L (ref 5–14)
AST (SGOT): 22 U/L (ref <=34)
BILIRUBIN TOTAL: 0.5 mg/dL (ref 0.3–1.2)
BLOOD UREA NITROGEN: 12 mg/dL (ref 9–23)
BUN / CREAT RATIO: 21
CALCIUM: 9.5 mg/dL (ref 8.7–10.4)
CHLORIDE: 106 mmol/L (ref 98–107)
CO2: 26 mmol/L (ref 20.0–31.0)
CREATININE: 0.58 mg/dL (ref 0.55–1.02)
EGFR CKD-EPI (2021) FEMALE: >90 mL/min/1.73m2 (ref >=60)
GLUCOSE RANDOM: 277 mg/dL — ABNORMAL HIGH (ref 70–179)
POTASSIUM: 3.8 mmol/L (ref 3.4–4.8)
PROTEIN TOTAL: 7 g/dL (ref 5.7–8.2)
SODIUM: 140 mmol/L (ref 135–145)

## 2024-01-03 LAB — MAGNESIUM: MAGNESIUM: 1.9 mg/dL (ref 1.6–2.6)

## 2024-01-03 LAB — PHOSPHORUS: PHOSPHORUS: 2.6 mg/dL (ref 2.4–5.1)

## 2024-01-03 MED ADMIN — blinatumomab (BLINCYTO) 28 mcg/day in sodium chloride (non-PVC) 0.9 % IVPB (7-day OUTPATIENT CADD infusion): 28 ug/d | INTRAVENOUS | @ 20:00:00 | Stop: 2024-01-03

## 2024-01-03 MED ADMIN — cytarabine (PF) (ARA-C) 100 mg, hydrocortisone sod succ (Solu-CORTEF) 50 mg in sodium chloride (NS) 0.9 % 6 mL INTRATHECAL syringe: INTRATHECAL | @ 19:00:00 | Stop: 2024-01-03

## 2024-01-03 MED ADMIN — midazolam (VERSED) injection 2 mg: 2 mg | INTRAVENOUS | @ 19:00:00 | Stop: 2024-01-03

## 2024-01-03 MED ADMIN — traMADol (ULTRAM) tablet 50 mg: 50 mg | ORAL | @ 20:00:00 | Stop: 2024-01-03

## 2024-01-03 NOTE — Unmapped (Signed)
 Encounter addended by: Viki Graver, PA on: 01/03/2024 2:29 PM   Actions taken: Clinical Note Signed

## 2024-01-03 NOTE — Unmapped (Signed)
 Encounter addended by: Maly Lemarr, RN on: 01/03/2024 5:02 PM   Actions taken: Clinical Note Signed, Flowsheet accepted

## 2024-01-03 NOTE — Unmapped (Signed)
 If you have questions or concerns, you may call the Nurse call line at [phone].    Hospital Outpatient Visit on 01/03/2024   Component Date Value Ref Range Status    Sodium 01/03/2024 140  135 - 145 mmol/L Final    Potassium 01/03/2024 3.8  3.4 - 4.8 mmol/L Final    Chloride 01/03/2024 106  98 - 107 mmol/L Final    CO2 01/03/2024 26.0  20.0 - 31.0 mmol/L Final    Anion Gap 01/03/2024 8  5 - 14 mmol/L Final    BUN 01/03/2024 12  9 - 23 mg/dL Final    Creatinine 88/41/6606 0.58  0.55 - 1.02 mg/dL Final    BUN/Creatinine Ratio 01/03/2024 21   Final    eGFR CKD-EPI (2021) Female 01/03/2024 >90  >=60 mL/min/1.32m2 Final    eGFR calculated with CKD-EPI 2021 equation in accordance with [employer] and [employer]ph[employer] Task Force recommendations.    Glucose 01/03/2024 277 (H)  70 - 179 mg/dL Final    Calcium  01/03/2024 9.5  8.7 - 10.4 mg/dL Final    Albumin 30/16/0109 3.8  3.4 - 5.0 g/dL Final    Total Protein 01/03/2024 7.0  5.7 - 8.2 g/dL Final    Total Bilirubin 01/03/2024 0.5  0.3 - 1.2 mg/dL Final    AST 32/35/5732 22  <=34 U/L Final    ALT 01/03/2024 27  10 - 49 U/L Final    Alkaline Phosphatase 01/03/2024 73  46 - 116 U/L Final    Magnesium  01/03/2024 1.9  1.6 - 2.6 mg/dL Final    Phosphorus 20/25/4270 2.6  2.4 - 5.1 mg/dL Final    WBC 62/37/6283 8.3  3.6 - 11.2 10*9/L Final    RBC 01/03/2024 2.85 (L)  3.95 - 5.13 10*12/L Final    HGB 01/03/2024 10.5 (L)  11.3 - 14.9 g/dL Final    HCT 15/17/6160 30.6 (L)  34.0 - 44.0 % Final    MCV 01/03/2024 107.4 (H)  77.6 - 95.7 fL Final    MCH 01/03/2024 37.0 (H)  25.9 - 32.4 pg Final    MCHC 01/03/2024 34.5  32.0 - 36.0 g/dL Final    RDW 73/71/0626 22.6 (H)  12.2 - 15.2 % Final    MPV 01/03/2024 8.8  6.8 - 10.7 fL Final    Platelet 01/03/2024 133 (L)  150 - 450 10*9/L Final    Neutrophils % 01/03/2024 70.8  % Final    Lymphocytes % 01/03/2024 19.3  % Final    Monocytes % 01/03/2024 9.2  % Final    Eosinophils % 01/03/2024 0.3  % Final Basophils % 01/03/2024 0.4  % Final    Absolute Neutrophils 01/03/2024 5.9  1.8 - 7.8 10*9/L Final    Absolute Lymphocytes 01/03/2024 1.6  1.1 - 3.6 10*9/L Final    Absolute Monocytes 01/03/2024 0.8  0.3 - 0.8 10*9/L Final    Absolute Eosinophils 01/03/2024 0.0  0.0 - 0.5 10*9/L Final    Absolute Basophils 01/03/2024 0.0  0.0 - 0.1 10*9/L Final    Macrocytosis 01/03/2024 Slight (A)  Not Present Final    Anisocytosis 01/03/2024 Marked (A)  Not Present Final

## 2024-01-03 NOTE — Unmapped (Signed)
 RED ZONE Means: RED ZONE: Take action now!     You need to be seen right away  Symptoms are at a severe level of discomfort    Call 911 or go to your nearest  Hospital for help     - Bleeding that will not stop    - Hard to breathe    - New seizure - Chest pain  - Fall or passing out  -Thoughts of hurting    yourself or others      Call 911 if you are going into the RED ZONE                  YELLOW ZONE Means:     Please call with any new or worsening symptom(s), even if not on this list.  Call [phone]  After hours, weekends, and holidays - you will reach a long recording with specific instructions, If not in an emergency such as above, please listen closely all the way to the end and choose the option that relates to your need.   You can be seen by a provider the same day through our Same Day Acute Care for Patients with Cancer program.      YELLOW ZONE: Take action today     Symptoms are new or worsening  You are not within your goal range for:    - Pain    - Shortness of breath    - Bleeding (nose, urine, stool, wound)    - Feeling sick to your stomach and throwing up    - Mouth sores/pain in your mouth or throat    - Hard stool or very loose stools (increase in       ostomy output)    - No urine for 12 hours    - Feeding tube or other catheter/tube issue    - Redness or pain at previous IV or port/catheter site    - Depressed or anxiety   - Swelling (leg, arm, abdomen,     face, neck)  - Skin rash or skin changes  - Wound issues (redness, drainage,    re-opened)  - Confusion  - Vision changes  - Fever >100.4 F or chills  - Worsening cough with mucus that is    green, yellow, or bloody  - Pain or burning when going to the    bathroom  - Home Infusion Pump Issue- call    [phone]         Call your healthcare provider if you are going into the YELLOW ZONE     GREEN ZONE Means:  Your symptoms are under controls  Continue to take your medicine as ordered  Keep all visits to the provider GREEN ZONE: You are in control  No increase or worsening symptoms  Able to take your medicine  Able to drink and eat    - DO NOT use MyChart messages to report red or yellow symptoms. Allow up to 3    business days for a reply.  -MyChart is for non-urgent medication refills, scheduling requests, or other general questions.         [id number] Rev. 01/28/2022  Approved by Oncology Patient Education Committee     Hospital Outpatient Visit on 01/03/2024   Component Date Value Ref Range Status    Sodium 01/03/2024 140  135 - 145 mmol/L Final    Potassium 01/03/2024 3.8  3.4 - 4.8 mmol/L Final    Chloride 01/03/2024 106  98 - 107  mmol/L Final    CO2 01/03/2024 26.0  20.0 - 31.0 mmol/L Final    Anion Gap 01/03/2024 8  5 - 14 mmol/L Final    BUN 01/03/2024 12  9 - 23 mg/dL Final    Creatinine 16/05/9603 0.58  0.55 - 1.02 mg/dL Final    BUN/Creatinine Ratio 01/03/2024 21   Final    eGFR CKD-EPI (2021) Female 01/03/2024 >90  >=60 mL/min/1.13m2 Final    eGFR calculated with CKD-EPI 2021 equation in accordance with [employer] and [employer]ph[employer] Task Force recommendations.    Glucose 01/03/2024 277 (H)  70 - 179 mg/dL Final    Calcium  01/03/2024 9.5  8.7 - 10.4 mg/dL Final    Albumin 54/04/8118 3.8  3.4 - 5.0 g/dL Final    Total Protein 01/03/2024 7.0  5.7 - 8.2 g/dL Final    Total Bilirubin 01/03/2024 0.5  0.3 - 1.2 mg/dL Final    AST 14/78/2956 22  <=34 U/L Final    ALT 01/03/2024 27  10 - 49 U/L Final    Alkaline Phosphatase 01/03/2024 73  46 - 116 U/L Final    Magnesium  01/03/2024 1.9  1.6 - 2.6 mg/dL Final    Phosphorus 21/30/8657 2.6  2.4 - 5.1 mg/dL Final    WBC 84/69/6295 8.3  3.6 - 11.2 10*9/L Final    RBC 01/03/2024 2.85 (L)  3.95 - 5.13 10*12/L Final    HGB 01/03/2024 10.5 (L)  11.3 - 14.9 g/dL Final    HCT 28/41/3244 30.6 (L)  34.0 - 44.0 % Final    MCV 01/03/2024 107.4 (H)  77.6 - 95.7 fL Final    MCH 01/03/2024 37.0 (H)  25.9 - 32.4 pg Final    MCHC 01/03/2024 34.5  32.0 - 36.0 g/dL Final    RDW 08/03/7251 22.6 (H)  12.2 - 15.2 % Final    MPV 01/03/2024 8.8  6.8 - 10.7 fL Final    Platelet 01/03/2024 133 (L)  150 - 450 10*9/L Final    Neutrophils % 01/03/2024 70.8  % Final    Lymphocytes % 01/03/2024 19.3  % Final    Monocytes % 01/03/2024 9.2  % Final    Eosinophils % 01/03/2024 0.3  % Final    Basophils % 01/03/2024 0.4  % Final    Absolute Neutrophils 01/03/2024 5.9  1.8 - 7.8 10*9/L Final    Absolute Lymphocytes 01/03/2024 1.6  1.1 - 3.6 10*9/L Final    Absolute Monocytes 01/03/2024 0.8  0.3 - 0.8 10*9/L Final    Absolute Eosinophils 01/03/2024 0.0  0.0 - 0.5 10*9/L Final    Absolute Basophils 01/03/2024 0.0  0.0 - 0.1 10*9/L Final    Macrocytosis 01/03/2024 Slight (A)  Not Present Final    Anisocytosis 01/03/2024 Marked (A)  Not Present Final

## 2024-01-03 NOTE — Unmapped (Signed)
 Patient arrived to chair 12.  No complaints noted.  Blina CADD infusion from medial port discontinued.  No forward flush performed, + BR noted.  Labs drawn.  Both medial and lateral port deaccessed with + BR prior.  Both medial and lateral port re accessed with + BR and dressed with CHG gel and mepore.    Pt hooked up to Blinatumomab  infusion via CADD pump on lateral port per orders.  At time of discharge, pump infusing.  Medial port heparin  locked with + BR prior.  Pt left clinic ambulatory.

## 2024-01-03 NOTE — Unmapped (Signed)
 Pre-medications of Versed  2 mg given. Attempted 3 passes into the lumbar spine  without successful spinal fluid return. Called in help from Abelina Hoes NP, who was also unable to collect spinal fluid. Discussed with the patient that we do not want to continue to attempt the procedure given level of discomfort and multiple unsuccessful attempts. Discussed with Kaaren Ora who will do an LP next week to confirm no blasts. Patient feeling well and provided 50 mg of Tramadol  for headache.

## 2024-01-03 NOTE — Unmapped (Signed)
 Patient arrived to chair 12.  No complaints noted.  Access of double port intact with + blood return.  Premedicated with 2 mg of Versed .  Consent signed and timeout performed.  See note from Kiki Pelton, PA.

## 2024-01-04 DIAGNOSIS — C9100 Acute lymphoblastic leukemia not having achieved remission: Principal | ICD-10-CM

## 2024-01-04 NOTE — Unmapped (Signed)
 State disability (Form 403) completed and return to patient via mychart.

## 2024-01-05 DIAGNOSIS — C9100 Acute lymphoblastic leukemia not having achieved remission: Principal | ICD-10-CM

## 2024-01-07 DIAGNOSIS — C9100 Acute lymphoblastic leukemia not having achieved remission: Principal | ICD-10-CM

## 2024-01-08 MED FILL — ICLUSIG 30 MG TABLET: ORAL | 30 days supply | Qty: 30 | Fill #2

## 2024-01-09 ENCOUNTER — Inpatient Hospital Stay: Admit: 2024-01-09 | Discharge: 2024-01-09 | Payer: PRIVATE HEALTH INSURANCE

## 2024-01-09 ENCOUNTER — Ambulatory Visit: Admit: 2024-01-09 | Discharge: 2024-01-09 | Payer: PRIVATE HEALTH INSURANCE

## 2024-01-09 ENCOUNTER — Inpatient Hospital Stay
Admit: 2024-01-09 | Discharge: 2024-01-09 | Payer: PRIVATE HEALTH INSURANCE | Attending: Adult Health | Primary: Adult Health

## 2024-01-09 LAB — CBC W/ AUTO DIFF
BASOPHILS ABSOLUTE COUNT: 0 10*9/L (ref 0.0–0.1)
BASOPHILS ABSOLUTE COUNT: 0.1 10*9/L (ref 0.0–0.1)
BASOPHILS RELATIVE PERCENT: 0.8 %
BASOPHILS RELATIVE PERCENT: 0.8 %
EOSINOPHILS ABSOLUTE COUNT: 0 10*9/L (ref 0.0–0.5)
EOSINOPHILS ABSOLUTE COUNT: 0 10*9/L (ref 0.0–0.5)
EOSINOPHILS RELATIVE PERCENT: 0.3 %
EOSINOPHILS RELATIVE PERCENT: 0.6 %
HEMATOCRIT: 17.2 % — ABNORMAL LOW (ref 34.0–44.0)
HEMATOCRIT: 30.6 % — ABNORMAL LOW (ref 34.0–44.0)
HEMOGLOBIN: 10.7 g/dL — ABNORMAL LOW (ref 11.3–14.9)
HEMOGLOBIN: 5.8 g/dL — ABNORMAL LOW (ref 11.3–14.9)
LYMPHOCYTES ABSOLUTE COUNT: 0.6 10*9/L — ABNORMAL LOW (ref 1.1–3.6)
LYMPHOCYTES ABSOLUTE COUNT: 1.3 10*9/L (ref 1.1–3.6)
LYMPHOCYTES RELATIVE PERCENT: 19.8 %
LYMPHOCYTES RELATIVE PERCENT: 20.9 %
MEAN CORPUSCULAR HEMOGLOBIN CONC: 34 g/dL (ref 32.0–36.0)
MEAN CORPUSCULAR HEMOGLOBIN CONC: 35 g/dL (ref 32.0–36.0)
MEAN CORPUSCULAR HEMOGLOBIN: 36.3 pg — ABNORMAL HIGH (ref 25.9–32.4)
MEAN CORPUSCULAR HEMOGLOBIN: 36.5 pg — ABNORMAL HIGH (ref 25.9–32.4)
MEAN CORPUSCULAR VOLUME: 104.3 fL — ABNORMAL HIGH (ref 77.6–95.7)
MEAN CORPUSCULAR VOLUME: 106.9 fL — ABNORMAL HIGH (ref 77.6–95.7)
MEAN PLATELET VOLUME: 8.7 fL (ref 6.8–10.7)
MEAN PLATELET VOLUME: 9.2 fL (ref 6.8–10.7)
MONOCYTES ABSOLUTE COUNT: 0.4 10*9/L (ref 0.3–0.8)
MONOCYTES ABSOLUTE COUNT: 0.6 10*9/L (ref 0.3–0.8)
MONOCYTES RELATIVE PERCENT: 10.6 %
MONOCYTES RELATIVE PERCENT: 12.8 %
NEUTROPHILS ABSOLUTE COUNT: 2.1 10*9/L (ref 1.8–7.8)
NEUTROPHILS ABSOLUTE COUNT: 4.1 10*9/L (ref 1.8–7.8)
NEUTROPHILS RELATIVE PERCENT: 66.3 %
NEUTROPHILS RELATIVE PERCENT: 67.1 %
PLATELET COUNT: 155 10*9/L (ref 150–450)
PLATELET COUNT: 81 10*9/L — ABNORMAL LOW (ref 150–450)
RED BLOOD CELL COUNT: 1.61 10*12/L — ABNORMAL LOW (ref 3.95–5.13)
RED BLOOD CELL COUNT: 2.94 10*12/L — ABNORMAL LOW (ref 3.95–5.13)
RED CELL DISTRIBUTION WIDTH: 19 % — ABNORMAL HIGH (ref 12.2–15.2)
RED CELL DISTRIBUTION WIDTH: 19.7 % — ABNORMAL HIGH (ref 12.2–15.2)
WBC ADJUSTED: 3.1 10*9/L — ABNORMAL LOW (ref 3.6–11.2)
WBC ADJUSTED: 6.1 10*9/L (ref 3.6–11.2)

## 2024-01-09 LAB — HEMATOPATHOLOGY LEUKEMIA/LYMPHOMA FLOW CYTOMETRY, CSF
LYMPHS CSF: 88 %
MONO/MACROPHAGE CSF: 12 %
NUCLEATED CELLS, CSF: 21 ul — ABNORMAL HIGH (ref <=5)
NUMBER OF CELLS CSF: 100
RBC CSF: 129 ul — ABNORMAL HIGH (ref <2)

## 2024-01-09 MED ADMIN — heparin, porcine (PF) 100 unit/mL injection 500 Units: 500 [IU] | INTRAVENOUS | @ 20:00:00 | Stop: 2024-01-10

## 2024-01-09 MED ADMIN — methotrexate (PRESERVATIVE FREE) 12 mg, hydrocortisone sod succ (Solu-CORTEF) 50 mg in sodium chloride (NS) 0.9 % 6 mL INTRATHECAL syringe: INTRATHECAL | @ 20:00:00 | Stop: 2024-01-09

## 2024-01-09 MED ADMIN — traMADol (ULTRAM) tablet 50 mg: 50 mg | ORAL | @ 21:00:00 | Stop: 2024-01-09

## 2024-01-09 MED ADMIN — midazolam (VERSED) injection 2 mg: 2 mg | INTRAVENOUS | @ 20:00:00 | Stop: 2024-01-09

## 2024-01-09 MED FILL — PREVYMIS 480 MG TABLET: ORAL | 28 days supply | Qty: 28 | Fill #1

## 2024-01-09 NOTE — Unmapped (Signed)
 Please leave dressing in place and keep it dry for 24 hrs before removing. You can resume normal activities tomorrow, but take things easy today.     You may take tylenol  as needed for discomfort at the area where the biopsy was taken.   After receiving Versed , please do not drive today    If you have questions between 8am to 5 pm Monday through Friday please call [phone] and speak to the operator.      For emergencies, evenings or weekends, please call [phone] and ask for oncology fellow on call.     Reasons to call emergency line may include:   Fever of 100.5 or greater   Nausea and/or vomiting not relieved with nausea medicine   Diarrhea or constipation   Severe pain not relieved with usual pain regimen       Hospital Outpatient Visit on 01/09/2024   Component Date Value Ref Range Status    WBC 01/09/2024 3.1 (L)  3.6 - 11.2 10*9/L Final    RBC 01/09/2024 1.61 (L)  3.95 - 5.13 10*12/L Final    HGB 01/09/2024 5.8 (L)  11.3 - 14.9 g/dL Final    HCT 29/56/2130 17.2 (L)  34.0 - 44.0 % Final    MCV 01/09/2024 106.9 (H)  77.6 - 95.7 fL Final    MCH 01/09/2024 36.3 (H)  25.9 - 32.4 pg Final    MCHC 01/09/2024 34.0  32.0 - 36.0 g/dL Final    RDW 86/57/8469 19.0 (H)  12.2 - 15.2 % Final    MPV 01/09/2024 8.7  6.8 - 10.7 fL Final    Platelet 01/09/2024 81 (L)  150 - 450 10*9/L Final    Neutrophils % 01/09/2024 66.3  % Final    Lymphocytes % 01/09/2024 19.8  % Final    Monocytes % 01/09/2024 12.8  % Final    Eosinophils % 01/09/2024 0.3  % Final    Basophils % 01/09/2024 0.8  % Final    Absolute Neutrophils 01/09/2024 2.1  1.8 - 7.8 10*9/L Final    Absolute Lymphocytes 01/09/2024 0.6 (L)  1.1 - 3.6 10*9/L Final    Absolute Monocytes 01/09/2024 0.4  0.3 - 0.8 10*9/L Final    Absolute Eosinophils 01/09/2024 0.0  0.0 - 0.5 10*9/L Final    Absolute Basophils 01/09/2024 0.0  0.0 - 0.1 10*9/L Final    Macrocytosis 01/09/2024 Slight (A)  Not Present Final    Anisocytosis 01/09/2024 Slight (A)  Not Present Final    WBC 01/09/2024 6.1  3.6 - 11.2 10*9/L Final    RBC 01/09/2024 2.94 (L)  3.95 - 5.13 10*12/L Final    HGB 01/09/2024 10.7 (L)  11.3 - 14.9 g/dL Final    HCT 62/95/2841 30.6 (L)  34.0 - 44.0 % Final    MCV 01/09/2024 104.3 (H)  77.6 - 95.7 fL Final    MCH 01/09/2024 36.5 (H)  25.9 - 32.4 pg Final    MCHC 01/09/2024 35.0  32.0 - 36.0 g/dL Final    RDW 32/44/0102 19.7 (H)  12.2 - 15.2 % Final    MPV 01/09/2024 9.2  6.8 - 10.7 fL Final    Platelet 01/09/2024 155  150 - 450 10*9/L Final    Neutrophils % 01/09/2024 67.1  % Final    Lymphocytes % 01/09/2024 20.9  % Final    Monocytes % 01/09/2024 10.6  % Final    Eosinophils % 01/09/2024 0.6  % Final    Basophils %  01/09/2024 0.8  % Final    Absolute Neutrophils 01/09/2024 4.1  1.8 - 7.8 10*9/L Final    Absolute Lymphocytes 01/09/2024 1.3  1.1 - 3.6 10*9/L Final    Absolute Monocytes 01/09/2024 0.6  0.3 - 0.8 10*9/L Final    Absolute Eosinophils 01/09/2024 0.0  0.0 - 0.5 10*9/L Final    Absolute Basophils 01/09/2024 0.1  0.0 - 0.1 10*9/L Final    Anisocytosis 01/09/2024 Moderate (A)  Not Present Final

## 2024-01-09 NOTE — Unmapped (Signed)
 Pt to clinic w cg, pt had LP during the visit, de-accessed the portx2; removed from  home pump, Pt remained flat for 1 hour followingthe procedure, timeout performed. AVS given to her by the provider

## 2024-01-09 NOTE — Unmapped (Signed)
 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 S/p procedure, provided tramadol  50 mg for low back pain  Driver Confirmed: Yes    Procedure Details     Consent: Informed consent was obtained. Risks of the procedure were discussed including: infection, bleeding, pain and headache.    A time-out in which Her patient identifiers were checked by 2 providers was performed.    The patient was positioned under sterile conditions. Iodine  solution and sterile drapes were utilized. Local anesthesia with 1% lidocaine  was applied subcutaneously then deep to the skin. A spinal needle was inserted at the L3 - L4 interspace. Spinal fluid was obtained and sent to the laboratory.    Intrathecal Methotrexate  was administered over 4 min.    The spinal needle with trocar was removed with minimal bleeding noted upon removal. A sterile bandage was placed over the puncture site after holding pressure.        Findings  6mL of clear spinal fluid was obtained.    Complications:  None; patient tolerated the procedure well.          Condition: stable    Plan  Bed rest for 1 hours.  Tylenol  650 mg for pain.  Call office if you develop a severe headache, nausea, vomiting, or fever greater than 100.5 F.      Abelina Hoes, AGPCNP-BC  Nurse Practitioner  Hematology/Oncology  [facility]

## 2024-01-14 DIAGNOSIS — C9100 Acute lymphoblastic leukemia not having achieved remission: Principal | ICD-10-CM

## 2024-01-15 ENCOUNTER — Other Ambulatory Visit: Admit: 2024-01-15 | Discharge: 2024-01-15 | Payer: PRIVATE HEALTH INSURANCE

## 2024-01-15 ENCOUNTER — Inpatient Hospital Stay: Admit: 2024-01-15 | Discharge: 2024-01-15 | Payer: PRIVATE HEALTH INSURANCE

## 2024-01-15 DIAGNOSIS — C9100 Acute lymphoblastic leukemia not having achieved remission: Principal | ICD-10-CM

## 2024-01-15 LAB — CBC W/ AUTO DIFF
BASOPHILS ABSOLUTE COUNT: 0 10*9/L (ref 0.0–0.1)
BASOPHILS RELATIVE PERCENT: 0.6 %
EOSINOPHILS ABSOLUTE COUNT: 0.1 10*9/L (ref 0.0–0.5)
EOSINOPHILS RELATIVE PERCENT: 1 %
HEMATOCRIT: 30.1 % — ABNORMAL LOW (ref 34.0–44.0)
HEMOGLOBIN: 10.5 g/dL — ABNORMAL LOW (ref 11.3–14.9)
LYMPHOCYTES ABSOLUTE COUNT: 1.2 10*9/L (ref 1.1–3.6)
LYMPHOCYTES RELATIVE PERCENT: 20.8 %
MEAN CORPUSCULAR HEMOGLOBIN CONC: 34.9 g/dL (ref 32.0–36.0)
MEAN CORPUSCULAR HEMOGLOBIN: 36.6 pg — ABNORMAL HIGH (ref 25.9–32.4)
MEAN CORPUSCULAR VOLUME: 104.9 fL — ABNORMAL HIGH (ref 77.6–95.7)
MEAN PLATELET VOLUME: 9.8 fL (ref 6.8–10.7)
MONOCYTES ABSOLUTE COUNT: 0.3 10*9/L (ref 0.3–0.8)
MONOCYTES RELATIVE PERCENT: 6 %
NEUTROPHILS ABSOLUTE COUNT: 4.1 10*9/L (ref 1.8–7.8)
NEUTROPHILS RELATIVE PERCENT: 71.6 %
PLATELET COUNT: 140 10*9/L — ABNORMAL LOW (ref 150–450)
RED BLOOD CELL COUNT: 2.87 10*12/L — ABNORMAL LOW (ref 3.95–5.13)
RED CELL DISTRIBUTION WIDTH: 17.7 % — ABNORMAL HIGH (ref 12.2–15.2)
WBC ADJUSTED: 5.7 10*9/L (ref 3.6–11.2)

## 2024-01-15 LAB — SMEAR - BONE MARROW PATIENT

## 2024-01-15 MED ADMIN — midazolam (VERSED) injection 2 mg: 2 mg | INTRAVENOUS | @ 17:00:00 | Stop: 2024-01-15

## 2024-01-15 NOTE — Unmapped (Signed)
 Please leave dressing in place and keep it dry for 24 hrs before removing. You can resume normal activities tomorrow, but take things easy today.     You may take tylenol  as needed for discomfort at the area where the biopsy was taken.   After receiving versed , please do not drive today.     If you have questions between 8am to 5 pm Monday through Friday please call [phone] and speak to the operator.      For emergencies, evenings or weekends, please call [phone] and ask for oncology fellow on call.     Reasons to call emergency line may include:   Fever of 100.5 or greater   Nausea and/or vomiting not relieved with nausea medicine   Diarrhea or constipation   Severe pain not relieved with usual pain regimen       Lab on 01/15/2024   Component Date Value Ref Range Status    WBC 01/15/2024 5.7  3.6 - 11.2 10*9/L Final    RBC 01/15/2024 2.87 (L)  3.95 - 5.13 10*12/L Final    HGB 01/15/2024 10.5 (L)  11.3 - 14.9 g/dL Final    HCT 25/36/6440 30.1 (L)  34.0 - 44.0 % Final    MCV 01/15/2024 104.9 (H)  77.6 - 95.7 fL Final    MCH 01/15/2024 36.6 (H)  25.9 - 32.4 pg Final    MCHC 01/15/2024 34.9  32.0 - 36.0 g/dL Final    RDW 34/74/2595 17.7 (H)  12.2 - 15.2 % Final    MPV 01/15/2024 9.8  6.8 - 10.7 fL Final    Platelet 01/15/2024 140 (L)  150 - 450 10*9/L Final    Neutrophils % 01/15/2024 71.6  % Final    Lymphocytes % 01/15/2024 20.8  % Final    Monocytes % 01/15/2024 6.0  % Final    Eosinophils % 01/15/2024 1.0  % Final    Basophils % 01/15/2024 0.6  % Final    Absolute Neutrophils 01/15/2024 4.1  1.8 - 7.8 10*9/L Final    Absolute Lymphocytes 01/15/2024 1.2  1.1 - 3.6 10*9/L Final    Absolute Monocytes 01/15/2024 0.3  0.3 - 0.8 10*9/L Final    Absolute Eosinophils 01/15/2024 0.1  0.0 - 0.5 10*9/L Final    Absolute Basophils 01/15/2024 0.0  0.0 - 0.1 10*9/L Final    Anisocytosis 01/15/2024 Slight (A)  Not Present Final

## 2024-01-15 NOTE — Unmapped (Signed)
 Date of Service: 01/15/2024      Problem List[1]    Indication:    Diagnosis ICD-10-CM Associated Orders   1. 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)     C91.00 BCR::ABL1 p190     BCR::ABL1 p190     Hematopathology Order     Hematopathology Order     Cytogenetics Cancer/FISH NON-BLOOD     Cytogenetics Cancer/FISH NON-BLOOD     DNA Extract and Hold     DNA Extract and Hold     B-ALL Minimal Residual Disease (MRD), Flow Cytometry, Bone Marrow     B-ALL Minimal Residual Disease (MRD), Flow Cytometry, Bone Marrow     midazolam  (VERSED ) injection 2 mg     traMADol  (ULTRAM ) tablet 50 mg     lidocaine  (PF) (XYLOCAINE -MPF) 20 mg/mL (2 %) injection 15 mL          Premedication: Versed  2mg  IV  Driver confirmed: yes    Ordering Provider: Wayland Haggis, MD    Clinician(s) Performing Procedure: Robbin Chill, FNP-BC        Bone Marrow Aspirate and Biopsy, right side (recommend attempting L side for next marrow due to pain on R)    The risks, benefits, and alternatives to the procedure were discussed. All questions were answered. The patient verbalized understanding and signed informed consent. After a time-out in which his patient identifiers were checked by 2 providers, the patient was laid in prone position on the table.   The  posterior superior iliac spine and iliac crest were cleaned, prepped and draped in the usual sterile fashion.     Anesthetic agent used: 2% plain lidocaine .      Utilizing a Ranfac needle, a bone marrow aspiration and biopsy was performed.  Specimen was sent for routine histopathologic stains and sectioning, flow cytometry, cytogenetics, and molecular analysis.     A pressure dressing was applied to the biopsy site.  Patient tolerated the procedure well.  Hemostasis was confirmed upon discharge.     The patient was given verbal instructions for wound care, such as to keep the biopsy site dry, covered for 24 hours, and to call your physician for a temperature > 100.5. Tylenol  may be taken for discomfort.    Details of Procedure:  Technologist Present:Brett Ritland  Spicules Confirmed Bedside:yes  Length of Core Biopsy: 0.6cm  Additional Details:Advanced into bone 1 times to obtain specimens; pt having too much pain and discomfort to attempt for larger core size and for re-direction for MRD  - recommend attempting L side for next marrow    Specimens Collected:  EDTA x 2  Heparin  x 2  Core biopsy x 1    Robbin Chill, MSN, FNP-BC, [facility]  Nurse Practitioner  [facility] Hematology/Oncology     Tramadol  50mg  PO given after procedure completed for soreness at site.         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

## 2024-01-16 DIAGNOSIS — C9100 Acute lymphoblastic leukemia not having achieved remission: Principal | ICD-10-CM

## 2024-01-16 NOTE — Unmapped (Signed)
 This pharmacist was notified by a technician that this patient has started a new medication Blinncyto (blinatumomab ) infusion.. I have reviewed the patient's medical record and have determined that there are no concerns for drug interactions with their new medication. The patients medication list has been updated, if appropriate, and no further action is required.      Approximate time spent: 0-5 minutes    Wilbern Hancock, [facility], Clinical Specialty Pharmacist  [facility]

## 2024-01-17 DIAGNOSIS — C9100 Acute lymphoblastic leukemia not having achieved remission: Principal | ICD-10-CM

## 2024-01-19 DIAGNOSIS — C9100 Acute lymphoblastic leukemia not having achieved remission: Principal | ICD-10-CM

## 2024-01-21 DIAGNOSIS — C9100 Acute lymphoblastic leukemia not having achieved remission: Principal | ICD-10-CM

## 2024-01-23 DIAGNOSIS — C9100 Acute lymphoblastic leukemia not having achieved remission: Principal | ICD-10-CM

## 2024-01-23 NOTE — Unmapped (Addendum)
 OUTPATIENT ONCOLOGY PALLIATIVE CARE    Principal Diagnosis: Patricia [name] is a 48 y.o. 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 Initially diagnosed in March 2023 and started on therapy on GRAAPH protocol. Recently in maintence when she presented with CNS relapse. BMBx shows MRD+ and therefore started on Blinatumomab , planning for bone marrow transplant.     Assessment/Plan:     #Headaches: Improved. Presented to ED 10/2023 with worsening headaches, found to have CNS relapse of B-ALL, started on IT chemotherapy which improved  - Continue treatment of underlying B-ALL, planning for bone marrow transplant    #Anxiety/Coping Support: Stable currently, preparing for transplant. Uses excellent coping strategies. Briefly stopped lexapro  but then restarted when noticed worsening ability to cope and more frequent emotional breakdowns. Since restarting she is doing well on her current dose and reports no recent panic attacks. Does note some undesired sexual side effects that she is considering discussing with gyn alternative management approaches.   - Cont lexapro  10mg  daily   - Continue working with local therapist - been working with him since brother died  - Continues to find fulfilling ways to give back to others     #Peripheral Neuropathy: Stable overall, comes and goes. 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   - discussed trial of capsaicin cream - patient has not tried but she still plans to  - She also is working on an herbal salve and will send ingredients  - could also roate from lexapro  to duloxetine in the future if more severe    #Hyperglycemia/DM: Worse. Now following with endocrinology. Fortunately not having significant symptoms.  - Stopped taking metformin  due to side effects of muscle pain/tinnitus  - Insulin  per endocrine    #Cancer-Related Fatigue: Stable overall.  - Encouraged light exercise as has not engaged in this as much lately  - Future Considerations: American Ginseng     #Cancer-Related Pain: Stable. HAs/Muscle aches intermittnetly. Responsive to tylenol , excedrine, and tizanidine  which she takes very intermittently.   - Cont tylenol  PRN  - Cont tizandine  PRN - has not been very helpful    #Advanced Care Planning:  Recent CNS relapse, has had very open discussions with oncology team and bone marrow transplant team. Is trying to get herself ready physically and mentally for transplant and preparing for ups and downs ahead. Is important to listen to her body and is writing a book about this.     Prior Convos:  - She and her husband have an excellent understanding of the current course of her treatment.  She recognizes that she is responding well and there is some uncertainty about how many rounds of treatment she will have to go through.  Has heard that the goal of treatment is curative, and she remains very hopeful for this.  Has been able to tolerate treatment fairly well to this point. Her major concern is about side effects from lumbar punctures.  Current focus is on cancer treatment and survival while maintaining quality of life is much as possible.    Health Care Decision Maker as of 01/30/2024    HCDM (patient stated preference): Patricia [name] Spouse - [phone]      # Controlled substances risk management.   - Patient does not have a signed pain medication agreement with our team.  No controlled substances being prescribed by our team currently.    - [place] database was reviewed today and it was appropriate.   - Urine drug screen was not performed at this visit. Findings: not applicable.   -  Patient has received information about safe storage and administration of medications.   - Patient has not received a prescription for narcan; is not applicable.       F/u: ~8-12 weeks    ----------------------------------------  Referring Provider: From inpatient palliative care team  Oncology Team: Malignant hematology  PCP: Estelle Toribio SAUNDERS, MD      Interval Hx 01/30/24:    On chart review:  Presented to ED 10/2023 with worsening headaches, found to have CNS relapse, started on IT chemotherapy, planning for bone marrow transplant    Patient reports:   History of Present Illness    Shares her headache symptoms began in mid-March with sudden headaches described as a 'brain freeze' sensation upon standing, progressively worsening and starting at the base of the skull, shooting upwards like 'electricity,' accompanied by a humming sensation.Had multiple attempts to get imaging and workup, ultimately [place] team was able to order MRI which confirmed CNS relapse and led to prolonged admission to start IT chemotherapy. Headaches have resolved since the first or second lumbar puncture with chemotherapy administration. Multiple lumbar punctures and a bone marrow biopsy have shown undetectable disease.    During the first week of Blina, she experienced tremors, which resolved after receiving IVIG. Currently, no significant side effects from [place] are reported, aside from minor shakiness.      She is preparing for a bone marrow transplant, having met with Dr. Sharlot and a coordinator. She has had numerous discussions about what to expect and anticipates she will have a match in a few months, her sister is not a candidate due to autoimmune diseases. Is important that donor does not have severe dog allergy as her dogs are important to her.     Continues to have fluctuating blood sugars, feels long acting works better than short acting insulin . Has CGM. Had to reschedule next endo apt for oncology apt.     She is actively trying to build stamina and muscle strength in preparation for upcoming treatments, despite experiencing hip pain from increased physical activity. She engages in gardening and participates in farmer's markets, although hard to plan in advance with not knowing when transplant will be.     Overall mood and spirits are good. Feeling better, writing a book called listen to your body and preparing for next steps.       HPI: Ph+ ALL, currently in remission.    Current cancer-directed therapy: maintenance vincristine  2g, Prednisone  200mg  D1-4 +dasatinib        Palliative Performance Scale: 90% - Ambulation: Full / Normal Activity, some evidence of disease / Self-Care:Full / Intake: Normal / Level of Conscious: Full      Coping/Support Issues: She and her husband have multiple stressors but overall feels like she is coping fairly well right now.  She will reach out to our palliative care team if new concerns emerge.    Goals of Care:  Focus on cancer directed therapy and survival while hoping to maintain her quality of life is much as possible    Social History:   Name of primary support: Husband [name]  occupation: 6 grade teacher      Advance Care Planning:   HCPOA: Spouse  Natural surrogate decision maker: Husband Patricia [name]  Living Will: no  ACP note:     Objective       Hematology/Oncology History Overview Note   Referring/Local Oncologist:    Diagnosis:   10/29/2021  Bone marrow, left iliac, aspiration and biopsy  -  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 The majority of the abnormal cells (64/88) contained an additional BCR/ABL1 fusion signal, while 9/88 abnormal cells contained an additional ABL1 and ASS1 signal.       Genetics:   Karyotype/FISH:   RESULTS   Date Value Ref Range Status   10/28/2021   Final    NOTE: This report reflects a combined study from a peripheral blood and a bone marrow core biopsy. Eleven cells from the peripheral blood and nine cells from the bone marrow core biopsy were analyzed. The BCR/ABL1 FISH analysis was performed on the peripheral blood.     Abnormal Karyotype: 47,XX,t(9;22)(q34;q11.2),+der(22)t(9;22)[18]/46,XX[2]    Abnormal FISH:  A BCR/ABL1 interphase FISH assay showed a signal pattern consistent with a BCR::ABL1 rearrangement and the 9;22 translocation in 88% of the 100 cells scored. The majority of the abnormal cells (64/88) contained an additional BCR/ABL1 fusion signal, while 9/88 abnormal cells contained an additional ABL1 and ASS1 signal.           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  MRD by flow insufficient, p190 2/100,000  12/10/2021: Cycle 2 B cycle + rituximab   12/14/21: IT#4  12/17/21: IT#5  01/10/22: Cycle 3 A cycle + rituximab   01/11/22: IT #6  01/24/22: Bmbx - MRD-neg by PCR (p190)   02/17/22: C4 B cycle + ritux  02/18/22: IT#7  03/21/22: C5 A cycle + ritux (4th dose)  03/22/22: IT#8  04/29/22: Bmbx - MRD-neg by PCR (p190)   05/18/22: restart dasatinib  70mg  s/p neutropenia  05/26/22: Start maintenance vincristine  2mg , Prednisone  200mg  D1-5   06/02/22: IT #9  06/27/22: C2 Maintenance: vincristine  2mg , Prednisone  200mg  D1-5, HOLD dasatinib  due to neutropenia  07/04/22: RESTART dasatinib  70mg   07/21/22: IT #10  07/26/22: C3 Maintenance: vincristine  2mg , Prednisone  200mg  D1-5, dasatinib  70 mg  08/08/22: IT #11 - prophylaxis completed  08/23/22: C4 Maintenance: vincristine  2mg , Prednisone  200mg  D1-5, dasatinib  70 mg  09/20/22: C5 Maintenance: vincristine  2mg , Prednisone  200mg  D1-5, dasatinib  70 mg  10/04/22: Bone marrow biopsy - continued remission, MRD and BCR/ABL p190 negative  10/18/22: C6 Maintenance. ANC 0.4, HOLD dasatinib , continue vincristine  2mg  and Prednisone  200mg  D1-5  11/15/22: C7 Maintenance ANC 0.4. HOLD TKI, continue vincristine  2mg  and Prednisone  200mg  D1-5  ~11/30/22: Start ponatinib  15 mg daily  12/13/22: C8 Maintenance ANC 1.2. ponatinib  15mg , vincristine  2mg  D1 and Prednisone  200mg  D1-5  01/10/23: C9 Maintenance Anc 0.7. HOLD ponatinib , continue vincristine  2mg  D1 and Prednisone  200mg  D1-5  01/26/23: ANC 1.7, restart ponatinib  15 mg  02/08/23: C10 Maintenance. ANC is 1.4 - continue ponatinib   03/07/23: C11 Maintenance ANC is 1.1. continue ponatinib   04/06/23: C12 Maintenance  04/12/23: Bmbx - continued remission, MRD-negative, BCR/ABL undetectable  05/02/23: C13 Maintenance  05/30/23: C14 Maintenance  06/27/23: C15 Maintenance PB p210 2/100,000  07/23/23: C16 Maintenance PB p210 2/100,000  08/22/23: C17 Maintenance PB p210  4/100,000  09/19/23: C18 Maintenance PB p210 2/100,000  10/16/23: C19 Maintenance - HOLD prednisone  due to hyperglycemia  10/16/23: Bmbx - CR, MRD-neg by flow, p210 47/100,000   11/01/23: Increased ponatinib  to 30 mg daily  11/26/23: CNS relapse - IT chemo   11/30/23: IT chemo - blasts present   12/01/23: Bmbx - MRD+ disease, 0.14% by flow, p190 241/100,000   12/04/23: IT chemo - blasts present   12/07/23: IT chemo - blasts present   12/07/23: IT chemo - no definitive blasts - 1/8 clear   12/12/23: C1D1 Blinatumomab    12/11/23: IT chemo - no blasts - 2/8 clear  12/18/23: IT chemo - no blasts - 3/8 clear   12/27/23: IT chemo - no blasts - 4/8 clear   01/09/24: IT chemo - no blasts - 5/8 clear   01/15/24: Bmbx - MRD negative by flow and p190   01/24/24: IT chemo          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)      10/29/2021 Initial Diagnosis    B-cell acute lymphoblastic leukemia (ALL) (CMS-HCC)     10/30/2021 - 03/30/2022 Chemotherapy    IP/OP LEUKEMIA GRAAPH-2005 < 60 YO (OP PEGFILGRASTIM  ON DAY 7)  [No description for this plan]     01/06/2022 - 04/07/2022 Endocrine/Hormone Therapy    OP LEUPROLIDE  (LUPRON ) 11.25 MG EVERY 3 MONTHS  Plan Provider: Toribio JONELLE Bunker, MD     05/26/2022 -  Chemotherapy    OP LEUKEMIA VINCRISTINE   vinCRIStine  2 mg IV on day 1     07/26/2022 - 07/26/2022 Endocrine/Hormone Therapy    OP LEUPROLIDE  (LUPRON ) 11.25 MG EVERY 3 MONTHS  Plan Provider: Toribio JONELLE Bunker, MD           REVIEW OF SYSTEMS:  A comprehensive review of 10 systems was negative except for pertinent positives noted in HPI.      PHYSICAL EXAM: Video visit  There were no vitals filed for this visit.    GEN: Awake and alert, pleasant appearing female in no acute distress. Hair is growing back and in pig tails  LUNGS: No increased work of breathing on room air, talking in full sentences.  PSYCH: Alert and oriented to person, place and time. Excited when talking about her projects.  SKIN: No rashes, petechiae or jaundice noted        The patient reports they are physically located in [place]  and is currently: at home. I conducted a audio/video visit. I spent  75m 01s on the video call with the patient. I spent an additional 20 minutes on pre- and post-visit activities on the date of service .     Jonas FABIENE Lerner, MD  [facility] and Palliative Medicine Fellow  [phone]

## 2024-01-23 NOTE — Unmapped (Signed)
 [facility] BMT & CT PROGRAM  NEW PATIENT CONSULTATION     [name]  Patient Age: 48 y.o.  Encounter Date: 01/24/2024    Primary Care Provider:  Estelle Toribio SAUNDERS, MD    Referring Physician:  Iva Alyce Ned, AGNP  [address]  [id number]  [place],  [place] [address]    Cancer Diagnosis: Ph+ B-ALL; Initial Dx 10/29/2021  Cancer status: CR1, BCR/ABL 47/100,000 10/16/23  Treatment Regimen: First Line, ponatinib  + maintenance (vincristine , prednisone ) s/p truncated course of GRAAPH-2005, Hyper-CVAD, received 4 cycles. Then transitioned to maintenance. Progressed. Now in complete molecular remission on ponatinib  and blinatumomab .   Treatment Goal: Curative  Comorbidities: HTN, anxiety, hemorrhoids with anal fissures  Transplant: Referral deferred in CR1      Patricia [name] is a 48 y.o. female with past medical history of chronic right shoulder and back pain, hypertension, anxiety, and recently diagnosed Ph+ ALL. 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  Given her deep molecular remission and her intolerance of hyperCVAD, we are transitioned to maintenance chemotherapy.     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 Rising BCR/ABL P190 levels prompted dose increase. Concern for CNS involvement due to symptoms. Maintenance therapy on hold due to transaminitis.  - Continue ponatinib  30 mg daily with 81 mg aspirin  daily  - Hold Cycle 20 IV vincristine  and oral prednisone  due to transaminitis.  - Perform MRI of brain and cervical spine on 11/24/2023.  - Plan lumbar puncture with intrathecal chemotherapy ~one week after last Excedrin dose (4/22)    Transaminitis  Elevated liver enzymes likely due to Excedrin. Differential includes CMV reactivation. Alcohol unlikely a factor.  - Hold IV vincristine  and oral prednisone .  - Order Tylenol  level, CMV level, and coagulation panel.  - Advise against use of Excedrin and Tylenol -containing products.  - Advise against alcohol consumption.  - Repeat liver function tests on 11/24/2023.    Neck pain with headaches radiating forward (primarily right-side), 2 falls, intermittent dizziness   Headaches and neck pain possibly related to cervical spine issues, however given reported intermittent weakness, dizziness, and falls, concern for CNS/extramedullary progression of the leukemia. Glucose levels less likely the cause - no hypoglycemic episodes. Pain helped by Excedrin but she was taking high doses and LFTs are now elevated. Gabapentin recently effective.  - Prescribe gabapentin 300 mg three times daily.  - Advise use of non-pharmacological measures such as cervical pillow and lumbar support pillow.  - MRI of brain and cervical spine on 11/24/2023 scheduled. Physiologist at [facility] recommends including thoracic/lumbar spine, so additional orders placed.  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 09/2022 restarted Valcyte  with the goal of controlling CMV and limiting neutropenia  - continue letermovir   - d/c CMV levels as of 03/2023 - per Dr. Sheena  - 09/19/23 - CMV negative - checked due to elevated temps  Hypogammaglobulinemia:   - Monthly IVIG started 07/26/22 - now at [facility] due to insurance coverage    Left knee pain  - 08/22/23 - will refer to orthopedics is pain persists/worsens    Hemorrhoids, Anal fissure: persistent but stable, per patient  - supportive care at home    Menses suppression and birth control: previously received Lupron  with last dose 07/26/22   - patient stopped oral contraceptive due to weight loss. Counseled on importance of birth control on TKI. She is abstinent and making an appointment with GYN       Psychosocial distress: She reports a moderate level of anxiety regarding the management of the above.   - Counseling given   - Consider ref to comprehensive cancer support program     Patient-centered care/Shared decision-making:   We discussed the plan above at length. The patient and her husband actively contributed to the conversation. Specifically, her most important outcomes are:   Prolonging overall survival and Maintaining her overall functional activity     Supportive Care Needs: We recommend based on the patient???s underlying diagnosis and treatment history the following supportive care:    1. Antimicrobial prophylaxis:    Viral - valacyclovir  and letermovir   Bacterial: Levofloxacin  when ANC <0.5  Fungal: Fluconazole  200mg  when ANC <0.5  PJP: dapsone  100mg  daily    2. Blood product support:  Leukoreduced blood products are required.  Irradiated blood products are preferred, but in case of urgent transfusion needs non-irradiated blood products may be used: 2 units for Hg <=8.0.     Coordination of care:   - HOLD this cycle of maintenance  - RTC in 2 weeks  - reschedule LP      History of Present Illness:   Patricia [name] is a 48 y.o. female with past medical history noted as above who presents for follow up Ph+ ALL.      Her social history is notable for being a Runner, broadcasting/film/video.  She was recently married. She lives with her husband.    Interim history    History of Present Illness      The patient, with a history of B cell Acute Lymphoblastic Leukemia (ALL) treated with GRAAPH 2005 plus Dasatinib  and currently on maintenance therapy with Ponatinib , Vincristine , and Prednisone , presents with headaches and neck pain. The patient reports using Excedrin Migraine at high doses, at least eight tablets a day, sometimes two tablets every three hours, to manage the pain. The patient also reports recent falls at home and has been seen by a physiologist at [facility]. The patient is due for the next cycle of maintenance therapy, which would be cycle 20. However, the patient's liver enzymes have significantly increased, possibly due to the high intake of Excedrin Migraine. The patient also has diabetes and is working with endocrinology.      ROS:   Ms. Batchelder c/o fatigue and weakness. She can perform all her ADLs. She can do a lot of things such as home work tasks etc, but needs rests.   She also naps regularly. Other than that she feels nearly back to normal self.   Denies fever chills or drenching night sweats.  Denies infections other than episode of sinusitis for which she was treated just before starting her salvage.   She is eating and drinking well.   Denies N/V/D. Denies abdominal pain.  Leg pain when active. Struggles to climb stairs.   Overall she has not been very active. She has never exercised. She has never  received PT.  Neuropathy, mild, in both toes. Mostly numbness.   ROS was otherwise negative.     Hematology/Oncology History Overview Note   Referring/Local Oncologist:    Diagnosis:   10/29/2021  Bone marrow, left iliac, aspiration and biopsy  -  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 The majority of the abnormal cells (64/88) contained an additional BCR/ABL1 fusion signal, while 9/88 abnormal cells contained an additional ABL1 and ASS1 signal.       Genetics:   Karyotype/FISH:   RESULTS   Date Value Ref Range Status   10/28/2021   Final    NOTE: This report reflects a combined study from a peripheral blood and a bone marrow core biopsy. Eleven cells from the peripheral blood and nine cells from the bone marrow core biopsy were analyzed. The BCR/ABL1 FISH analysis was performed on the peripheral blood.     Abnormal Karyotype:  47,XX,t(9;22)(q34;q11.2),+der(22)t(9;22)[18]/46,XX[2]    Abnormal FISH:  A BCR/ABL1 interphase FISH assay showed a signal pattern consistent with a BCR::ABL1 rearrangement and the 9;22 translocation in 88% of the 100 cells scored. The majority of the abnormal cells (64/88) contained an additional BCR/ABL1 fusion signal, while 9/88 abnormal cells contained an additional ABL1 and ASS1 signal.           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  MRD by flow insufficient, p190 2/100,000  12/10/2021: Cycle 2 B cycle + rituximab   12/14/21: IT#4  12/17/21: IT#5  01/10/22: Cycle 3 A cycle + rituximab   01/11/22: IT #6  01/24/22: Bmbx - MRD-neg by PCR (p190)   02/17/22: C4 B cycle + ritux  02/18/22: IT#7  03/21/22: C5 A cycle + ritux (4th dose)  03/22/22: IT#8  04/29/22: Bmbx - MRD-neg by PCR (p190)   05/18/22: restart dasatinib  70mg  s/p neutropenia  05/26/22: Start maintenance vincristine  2mg , Prednisone  200mg  D1-5   06/02/22: IT #9  06/27/22: C2 Maintenance: vincristine  2mg , Prednisone  200mg  D1-5, HOLD dasatinib  due to neutropenia  07/04/22: RESTART dasatinib  70mg   07/21/22: IT #10  07/26/22: C3 Maintenance: vincristine  2mg , Prednisone  200mg  D1-5, dasatinib  70 mg  08/08/22: IT #11 - prophylaxis completed  08/23/22: C4 Maintenance: vincristine  2mg , Prednisone  200mg  D1-5, dasatinib  70 mg  09/20/22: C5 Maintenance: vincristine  2mg , Prednisone  200mg  D1-5, dasatinib  70 mg  10/04/22: Bone marrow biopsy - continued remission, MRD and BCR/ABL p190 negative  10/18/22: C6 Maintenance. ANC 0.4, HOLD dasatinib , continue vincristine  2mg  and Prednisone  200mg  D1-5  11/15/22: C7 Maintenance ANC 0.4. HOLD TKI, continue vincristine  2mg  and Prednisone  200mg  D1-5  ~11/30/22: Start ponatinib  15 mg daily  12/13/22: C8 Maintenance ANC 1.2. ponatinib  15mg , vincristine  2mg  D1 and Prednisone  200mg  D1-5  01/10/23: C9 Maintenance Anc 0.7. HOLD ponatinib , continue vincristine  2mg  D1 and Prednisone  200mg  D1-5  01/26/23: ANC 1.7, restart ponatinib  15 mg  02/08/23: C10 Maintenance. ANC is 1.4 - continue ponatinib   03/07/23: C11  Maintenance ANC is 1.1. continue ponatinib   04/06/23: C12 Maintenance  04/12/23: Bmbx - continued remission, MRD-negative, BCR/ABL undetectable  05/02/23: C13 Maintenance  05/30/23: C14 Maintenance  06/27/23: C15 Maintenance PB p210 2/100,000  07/23/23: C16 Maintenance PB p210 2/100,000  08/22/23: C17 Maintenance PB p210 4/100,000  09/19/23: C18 Maintenance PB p210 2/100,000  10/16/23: C19 Maintenance - HOLD prednisone  due to hyperglycemia  10/16/23: Bmbx - CR, MRD-neg by flow, p210 47/100,000   11/01/23: INCREASE ponatinib  to 30 mg daily       Allergies:  Allergies   Allergen Reactions    Erythromycin Hives     Other reaction(s): Not available    Other      Pt cant take ibuprofen due to condition.    Sulfa (Sulfonamide Antibiotics) Anaphylaxis     Other reaction(s): Not available    Azithromycin Hives     Per patient report     Medications:     Current Outpatient Medications:     acetaminophen  (TYLENOL  8 HOUR) 650 MG CR tablet, Take 2 tablets (1,300 mg total) by mouth every eight (8) hours as needed for pain., Disp: , Rfl:     albuterol  HFA 90 mcg/actuation inhaler, Inhale 2 puffs every six (6) hours as needed for wheezing., Disp: 8 g, Rfl: 1    aspirin  (ECOTRIN) 81 MG tablet, Take 1 tablet (81 mg total) by mouth daily., Disp: , Rfl:     blood sugar diagnostic (GLUCOSE BLOOD) Strp, Use to check blood sugar two times a day., Disp: 100 each, Rfl: 0    blood-glucose meter kit, Use to check blood sugar two times a day. Use as instructed., Disp: 1 each, Rfl: 0    cetirizine  (ZYRTEC ) 10 MG tablet, Take 1 tablet (10 mg total) by mouth nightly., Disp: 90 tablet, Rfl: 3    dapsone  100 MG tablet, Take 1 tablet (100 mg total) by mouth daily. TAKE 1 TABLET(100 MG) BY MOUTH DAILY, Disp: 30 tablet, Rfl: 11    escitalopram  oxalate (LEXAPRO ) 10 MG tablet, Take 1 tablet (10 mg total) by mouth daily., Disp: 90 tablet, Rfl: 2    insulin  glargine (LANTUS  SOLOSTAR U-100 INSULIN ) 100 unit/mL (3 mL) injection pen, Inject 0.3 mL (30 Units total) under the skin nightly., Disp: , Rfl:     lancets Misc, Use to check blood sugar as directed 2 times a day & for symptoms of high or low blood sugar., Disp: 100 each, Rfl: 0    letermovir  (PREVYMIS ) 480 mg tablet, Take 1 tablet (480 mg total) by mouth daily., Disp: 28 tablet, Rfl: 5    losartan  (COZAAR ) 50 MG tablet, Take 0.5 tablets (25 mg total) by mouth daily., Disp: , Rfl:     PONATinib  (ICLUSIG ) 30 mg tablet, Take 1 tablet (30 mg total) by mouth daily. Swallow tablets whole. Do not crush, break, cut or chew tablets., Disp: 30 tablet, Rfl: 5    [Paused] rosuvastatin  (CRESTOR ) 10 MG tablet, Take 1 tablet (10 mg total) by mouth nightly., Disp: 90 tablet, Rfl: 3    valACYclovir  (VALTREX ) 500 MG tablet, Take 1 tablet (500 mg total) by mouth daily., Disp: 30 tablet, Rfl: 12    Past Medical History:   Diagnosis Date    B-cell acute lymphoblastic leukemia (ALL)    10/29/2021    EBV infection    Hyperglycemia in February   DM diagnosed in March     Past Surgical History: None.    Social History:  Used to work as a Runner, broadcasting/film/video:  thought sixth grade science.   She is now disabled.  Got married in 2023. Lives with her husband, who is very supportive.    They have no children. 2 dogs.  ETOH, socially, small amounts.  Smoking on-off for 20+ years, 1/2 pack/day. Quit 6-7 years ago.  Denies use of illicit drugs.     Siblings: 64 yo sister. Same parents. She has Hashimotos amd various autoimmune disorders.   Parents 34 and 58 yo.     Family History:  Cousin with leukemia in 70-ties. Exposure to carcinogens in [employer].      Objective:   BP 135/96  - Pulse 85  - Temp 36.6 ??C (97.9 ??F) (Oral)  - Resp 16  - Wt 88.6 kg (195 lb 5.2 oz)  - SpO2 96%  - BMI 31.53 kg/m??     Physical Exam:  General: Resting in no apparent distress, accompanied by husband  HEENT:  Clear sclera, conjunctiva  CARDAC: RRR, no R,M,Gs, no pitting peripheral edema  RESP: nonlabored, bilaterally CTA  GI: Soft, nontender, active bowel sounds, no hepatic or splenomegaly  NEURO: alert, 0x4, steady gait, no focal deficits  PSYCH: appropriate  DERM: no visible rashes, lesions  LINE: port      Results      LABS  BCR/ABL P190: undetectable (Summer 2024)  BCR/ABL P190: 47/100,000 cells (11/01/2023)  AST: 27 (10/17/2023)  AST: 752 (11/19/2023)  ALT: 56 (10/17/2023)  ALT: 1663 (11/19/2023)  Alkaline phosphatase: 283 (11/19/2023)    PATHOLOGY  Bone marrow biopsy: not specified (10/17/2023)    Assessment and Plan:     I reviewed with the patient and her daughter the logistics of transplant.  I discussed the process of administration of preparative regimen, infusion of donor???s stem cells, management of aplasia that follows administration of preparative regimen and the use of immunotherapy.  I explained that allogeneic HSCT is associated with a 20-25% risk of treatment related mortality, and could result in substantial morbidity. I went over the toxicities of transplant, which include but are not necessarily limited to: Bleeding, infection, GVHD, graft failure, direct organ toxicity from the conditioning regimen (VOD, lung injury), N/V/D, mucositis, and alopecia. I also explained that the procedure would require a one month hospitalization, and extensive outpatient follow-up after hospitalization. I explained that the latter would involve physician visit 2-3 times per week, and that this would continue for at least 100 days after transplant. I also went over the need for a full time caregiver(s) to stay with the patient during this period of very intense outpatient follow-up.      Tzippy Testerman, MD  Associate Professor  Hematology [facility]

## 2024-01-23 NOTE — Unmapped (Signed)
 [facility] Follow Up Visit Note     [name]  Patient Age: 48 y.o.  Encounter Date: 01/24/2024    Primary Care Provider:  Estelle Toribio SAUNDERS, MD    Referring Physician:  Iva Alyce Ned, AGNP  [address]  [id number]  [place],  [place] [address]    Cancer Diagnosis: Ph+ B-ALL; Initial Dx 10/29/2021  Cancer status: CR2, MRD negative, undetectable BCR/ABL <1/100,000 01/15/24  Treatment Regimen: Second Line, Blinatumumab (C1D1 5/13- )+ Ponatinib .                                      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 She was in MRD-negative (by p190) CR1.  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  Given her deep molecular remission and her intolerance of hyperCVAD, we were transitioned to maintenance chemotherapy. Rising BCR/ABL P190 levels on 09/2023 prompted Ponatinib  dose increase. On 11/21/2023 the maintenance therapy was held due to transaminitis, and Brain MRI/cervical and LP were order dir to headaches and concern for recurrence.     On 11/24/2023, the patient presented to the ED with worsening subacute headaches over the past month and elevated liver enzymes, the latest likely due to drug-induced liver injury (DILI) from pain medications; Ponatinib  was held at that time. A lumbar puncture on 4/27 confirmed relapsed B-lymphoblastic leukemia with 79% blasts by flow cytometry. She was started on twice-weekly intrathecal chemotherapy as an inpatient on 4/28. Ponatinib  was restarted on 5/1. A bone marrow biopsy on 5/2 showed morphologic remission (<1% blasts), minimal residual disease 0.14% abnormal B-lymphoblasts by flow cytometry, and BCR::ABL1 p190 transcript was present at 241/100,000 cells. A repeat lumbar puncture on 5/12 showed no detectable blasts in the CSF, and she transitioned to weekly IT chemotherapy. On 5/13, she began Blinatumomab  (Cycle 1 Day 1) and experienced only Grade I ICANS (tremor), which improved without intervention. A follow-up bone marrow biopsy on 6/16 showed normocellular marrow with 1% blasts, no MRD by flow cytometry, and undetectable BCR::ABL1 p190 transcript, indicating an excellent response to therapy.    Today, 01/24/2024, she presents for follow up and reports feeling much better.      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 BMBx on 5/2 showed morphologic remission (<1% blasts), but molecular relapse with BCR::ABL1 p190 at 241/100,000.  Received C1D1 Blina 5/13 after LP cleared.   The plan is to continue Blinatumomab  cycles until the transplant is ready, with the possibility of Blinatumomab  maintenance if needed. She is aware of the logistical challenges and potential complications associated with the upcoming transplant.  - Continue ponatinib  30 mg daily with 81 mg aspirin  daily (held from 4/25-11/30/2023 due to G4 transiminitis)  - Continue Blina (C1D1 5/13). C2D1 planned for 01/25/2024  - Proceed with IT chemo scheduled for today 01/24/2024, then will continue monthly (was on 2x/Week IT from 4/28-5/07/2024 when CSF cleared, then weekly until today)  -  Reviewed BMBx from 6/16, MRD negative, undetectable BCR::ABL1 p190.     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   -09/2022 restarted Valcyte  with the goal of controlling CMV and limiting neutropenia  - continue letermovir   - d/c CMV levels as of 03/2023 - per Dr. Sheena  - 09/19/23 - CMV negative - checked due to elevated temps    Hypogammaglobulinemia:   - Monthly IVIG started 07/26/22 - now at [facility] due to insurance coverage    Hemorrhoids, Anal fissure: persistent but stable, per patient  - supportive care at home    Menses suppression and birth control: previously received Lupron  with last dose 07/26/22   - patient stopped oral contraceptive due to weight loss. Counseled on importance of birth control on TKI. She is abstinent and making an appointment with GYN    Psychosocial distress: She reports a moderate level of anxiety regarding the management of the above.   - Counseling given   - Consider ref to comprehensive cancer support program     Patient-centered care/Shared decision-making:   We discussed the plan above at length. The patient and her husband actively contributed to the conversation. Specifically, her most important outcomes are:   -Prolonging overall survival and Maintaining her overall functional activity     Supportive Care Needs: We recommend based on the patient???s underlying diagnosis and treatment history the following supportive care:    1. Antimicrobial prophylaxis:    Viral - valacyclovir  and letermovir   Bacterial: Levofloxacin  when ANC <0.5  Fungal: Fluconazole  200mg  when ANC <0.5  PJP: dapsone  100mg  daily    2. Blood product support:  Leukoreduced blood products are required.  Irradiated blood products are preferred, but in case of urgent transfusion needs non-irradiated blood products may be used: 2 units for Hg <=8.0.     Coordination of care:   - Continue Blina as planned  - Continue care coordination with BMT team    Seen and discussed with Dr. Estelle Hannah KATHEE Almon Lucas, MD   Hematology-Oncology Fellow       Toribio Estelle, MD  Leukemia Program  Division of Hematology  [facility]    Nurse Navigator (non-clinical trial patients): Irving Margo, RN        Tel. [phone]       Fax. [phone]  Toll-free appointments: [phone]  Scheduling assistance: [phone]  After hours/weekends: [phone] (ask for adult hematology/oncology on-call)    =========================================================  History of Present Illness:   JAMAYAH MYSZKA is a 48 y.o. female with past medical history noted as above who presents for follow up Ph+ ALL.      Interim history  Since last seen in clinic in 422//2025:  11/24/2023: Presented to ED with worsening subacute headaches (1 month duration) and elevated liver enzymes, likely due to DILI from pain medications. Ponatinib  was held.  11/26/2023: Lumbar puncture revealed B-lymphoblastic leukemia (79% blasts by flow cytometry).  11/27/2023: Initiated twice-weekly intrathecal chemotherapy as inpatient until CSF clearance.  11/30/2023: Ponatinib  restarted.  12/01/2023: Bone marrow biopsy (  BMBx) showed morphologic remission but was MRD-positive:       Normocellular marrow with trilineage hematopoiesis; <1% blasts.       MRD: 0.14% abnormal B-lymphoblasts;        BCR::ABL1 p190: 241/100,000 cells.  12/11/2023: CSF with undetectable blasts; transitioned to weekly intrathecal chemotherapy.  12/12/2023: Initiated Blinatumomab  (Cycle 1 Day 1). Tolerated well with only Grade I ICANS (tremor), which improved without intervention.  01/15/2024: Follow-up BMBx showed remisssion      Normocellular marrow with erythroid predominance and 1% blasts.      No MRD by flow cytometry;       No detectable BCR::ABL1 p190 transcript (sensitivity 1 in 100,000 cells).  History of Present Illness  ULDINE FUSTER is a 48 year old female with leukemia who presents for a follow-up regarding her bone marrow transplant preparation and Blina treatment.    She is currently preparing for a bone marrow transplant and finds the extensive information about the procedure overwhelming. Earlier today, she attended a detailed appointment where she received a binder with information and statistics about the transplant process. She feels 'a little terrified' about the transplant but appreciates the detailed information provided by the coordinator and doctor.    She is scheduled for a lumbar puncture with intrathecal chemotherapy today and will be admitted for Blinatumomab  treatment tomorrow. She is also scheduled to receive IVIG later today. Her recent complete blood count showed a hemoglobin level of 11.8, white blood cells at 6.2, and platelets at 154. Her metabolic panel included a glucose level of 209, which she attributes to having eaten breakfast.    She is currently taking Ponatinib  30 mg and aspirin  81 mg, both at night. She previously experienced elevated liver enzymes due to excessive Excedrin use, which has since resolved after discontinuation. Ponatinib  was restarted on May 1st after being held during hospitalization in April due to high liver enzyme levels.    Her bone marrow biopsy on June 16th showed no detectable disease, with BCR ABL undetectable and MRD by flow cytometry negative. She has had biweekly lumbar punctures, with the first one without blasts on May 12th, leading to a transition to weekly procedures.    No current symptoms such as chills, fever, headache, vision changes, palpitations, shortness of breath, abdominal pain, leg swelling, or bleeding. She has a history of tremors which resolved after receiving IVIG, and she reports no recurrence of tremors since then.    She is concerned about logistical aspects of the transplant, including the need for a second caregiver and housing arrangements due to the distance from the hospital. She is also awaiting long-term disability benefits, which have not yet been processed.        Oncology History is as below:   Hematology/Oncology History Overview Note   Referring/Local Oncologist:    Diagnosis:   10/29/2021  Bone marrow, left iliac, aspiration and biopsy  -  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 The majority of the abnormal cells (64/88) contained an additional BCR/ABL1 fusion signal, while 9/88 abnormal cells contained an additional ABL1 and ASS1 signal.       Genetics:   Karyotype/FISH:   RESULTS   Date Value Ref  Range Status   10/28/2021   Final    NOTE: This report reflects a combined study from a peripheral blood and a bone marrow core biopsy. Eleven cells from the peripheral blood and nine cells from the bone marrow core biopsy were analyzed. The BCR/ABL1 FISH analysis was performed on the peripheral blood.     Abnormal Karyotype:  47,XX,t(9;22)(q34;q11.2),+der(22)t(9;22)[18]/46,XX[2]    Abnormal FISH:  A BCR/ABL1 interphase FISH assay showed a signal pattern consistent with a BCR::ABL1 rearrangement and the 9;22 translocation in 88% of the 100 cells scored. The majority of the abnormal cells (64/88) contained an additional BCR/ABL1 fusion signal, while 9/88 abnormal cells contained an additional ABL1 and ASS1 signal.           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 MRD by flow insufficient, p190 2/100,000  12/10/2021: Cycle 2 B cycle + rituximab   12/14/21: IT#4  12/17/21: IT#5  01/10/22: Cycle 3 A cycle + rituximab   01/11/22: IT #6  01/24/22: Bmbx - MRD-neg by PCR (p190)   02/17/22: C4 B cycle + ritux  02/18/22: IT#7  03/21/22: C5 A cycle + ritux (4th dose)  03/22/22: IT#8  04/29/22: Bmbx - MRD-neg by PCR (p190)   05/18/22: restart dasatinib  70mg  s/p neutropenia  05/26/22: Start maintenance vincristine  2mg , Prednisone  200mg  D1-5   06/02/22: IT #9  06/27/22: C2 Maintenance: vincristine  2mg , Prednisone  200mg  D1-5, HOLD dasatinib  due to neutropenia  07/04/22: RESTART dasatinib  70mg   07/21/22: IT #10  07/26/22: C3 Maintenance: vincristine  2mg , Prednisone  200mg  D1-5, dasatinib  70 mg  08/08/22: IT #11 - prophylaxis completed  08/23/22: C4 Maintenance: vincristine  2mg , Prednisone  200mg  D1-5, dasatinib  70 mg  09/20/22: C5 Maintenance: vincristine  2mg , Prednisone  200mg  D1-5, dasatinib  70 mg  10/04/22: Bone marrow biopsy - continued remission, MRD and BCR/ABL p190 negative  10/18/22: C6 Maintenance. ANC 0.4, HOLD dasatinib , continue vincristine  2mg  and Prednisone  200mg  D1-5  11/15/22: C7 Maintenance ANC 0.4. HOLD TKI, continue vincristine  2mg  and Prednisone  200mg  D1-5  ~11/30/22: Start ponatinib  15 mg daily  12/13/22: C8 Maintenance ANC 1.2. ponatinib  15mg , vincristine  2mg  D1 and Prednisone  200mg  D1-5  01/10/23: C9 Maintenance Anc 0.7. HOLD ponatinib , continue vincristine  2mg  D1 and Prednisone  200mg  D1-5  01/26/23: ANC 1.7, restart ponatinib  15 mg  02/08/23: C10 Maintenance. ANC is 1.4 - continue ponatinib   03/07/23: C11 Maintenance ANC is 1.1. continue ponatinib   04/06/23: C12 Maintenance  04/12/23: Bmbx - continued remission, MRD-negative, BCR/ABL undetectable  05/02/23: C13 Maintenance  05/30/23: C14 Maintenance  06/27/23: C15 Maintenance PB p210 2/100,000  07/23/23: C16 Maintenance PB p210 2/100,000  08/22/23: C17 Maintenance PB p210 4/100,000  09/19/23: C18 Maintenance PB p210 2/100,000  10/16/23: C19 Maintenance - HOLD prednisone  due to hyperglycemia  10/16/23: Bmbx - CR, MRD-neg by flow, p210 47/100,000   11/01/23: Increased ponatinib  to 30 mg daily  11/26/23: CNS relapse - IT chemo   11/30/23: IT chemo - blasts present   12/01/23: Bmbx - MRD+ disease, 0.14% by flow, p190 241/100,000   12/04/23: IT chemo - blasts present   12/07/23: IT chemo - blasts present   12/07/23: IT chemo - no definitive blasts - 1/8 clear   12/12/23: C1D1 Blinatumomab    12/11/23: IT  chemo - no blasts - 2/8 clear  12/18/23: IT chemo - no blasts - 3/8 clear   12/27/23: IT chemo - no blasts - 4/8 clear   01/09/24: IT chemo - no blasts - 5/8 clear   01/15/24: Bmbx - MRD negative by flow and p190   01/24/24: IT chemo              Review of Systems:   ROS reviewed and negative except as noted in H and P     Allergies:  Allergies   Allergen Reactions    Erythromycin Hives     Other reaction(s): Not available    Other      Pt cant take ibuprofen due to condition.    Sulfa (Sulfonamide Antibiotics) Anaphylaxis     Other reaction(s): Not available    Azithromycin Hives     Per patient report       Medications:     Current Outpatient Medications:     acetaminophen  (TYLENOL  8 HOUR) 650 MG CR tablet, Take 2 tablets (1,300 mg total) by mouth every eight (8) hours as needed for pain., Disp: , Rfl:     albuterol  HFA 90 mcg/actuation inhaler, Inhale 2 puffs every six (6) hours as needed for wheezing., Disp: 8 g, Rfl: 1    aspirin  (ECOTRIN) 81 MG tablet, Take 1 tablet (81 mg total) by mouth daily., Disp: , Rfl:     blood sugar diagnostic (GLUCOSE BLOOD) Strp, Use to check blood sugar two times a day., Disp: 100 each, Rfl: 0    blood-glucose meter kit, Use to check blood sugar two times a day. Use as instructed., Disp: 1 each, Rfl: 0    cetirizine  (ZYRTEC ) 10 MG tablet, Take 1 tablet (10 mg total) by mouth nightly., Disp: 90 tablet, Rfl: 3    dapsone  100 MG tablet, Take 1 tablet (100 mg total) by mouth daily. TAKE 1 TABLET(100 MG) BY MOUTH DAILY, Disp: 30 tablet, Rfl: 11 escitalopram  oxalate (LEXAPRO ) 10 MG tablet, Take 1 tablet (10 mg total) by mouth daily., Disp: 90 tablet, Rfl: 2    insulin  glargine (LANTUS  SOLOSTAR U-100 INSULIN ) 100 unit/mL (3 mL) injection pen, Inject 0.3 mL (30 Units total) under the skin nightly., Disp: , Rfl:     lancets Misc, Use to check blood sugar as directed 2 times a day & for symptoms of high or low blood sugar., Disp: 100 each, Rfl: 0    letermovir  (PREVYMIS ) 480 mg tablet, Take 1 tablet (480 mg total) by mouth daily., Disp: 28 tablet, Rfl: 5    losartan  (COZAAR ) 50 MG tablet, Take 0.5 tablets (25 mg total) by mouth daily., Disp: , Rfl:     PONATinib  (ICLUSIG ) 30 mg tablet, Take 1 tablet (30 mg total) by mouth daily. Swallow tablets whole. Do not crush, break, cut or chew tablets., Disp: 30 tablet, Rfl: 5    [Paused] rosuvastatin  (CRESTOR ) 10 MG tablet, Take 1 tablet (10 mg total) by mouth nightly., Disp: 90 tablet, Rfl: 3    valACYclovir  (VALTREX ) 500 MG tablet, Take 1 tablet (500 mg total) by mouth daily., Disp: 30 tablet, Rfl: 12  No current facility-administered medications for this visit.    Facility-Administered Medications Ordered in Other Visits:     dextrose  5 % infusion, 30 mL/hr, Intravenous, Continuous, Estelle Toribio SAUNDERS, MD, Last Rate: 30 mL/hr at 01/24/24 1557, 30 mL/hr at 01/24/24 1557    OKAY TO SEND MEDICATION/CHEMOTHERAPY TO OUTPATIENT UNIT, , Other, Once, Iva Alyce Ned, AGNP  Medical History:  Past Medical History:   Diagnosis Date    B-cell acute lymphoblastic leukemia (ALL)    10/29/2021    EBV infection        Social History:  Social History     Social History Narrative    Not on file   Her social history is notable for being a Runner, broadcasting/film/video.  She was recently married. She lives with her husband.    Family History:  No family history on file.    Objective:   Wt 88.7 kg (195 lb 8.8 oz)  - BMI 31.56 kg/m??     Physical Exam:  General: Resting in no apparent distress, accompanied by husband  HEENT:  Clear sclera, conjunctiva  CARDAC: RRR, no R,M,Gs, no pitting peripheral edema  RESP: nonlabored, bilaterally CTA  GI: Soft, nontender, active bowel sounds, no hepatic or splenomegaly  NEURO: alert, 0x4, steady gait, no focal deficits  PSYCH: appropriate  DERM: no visible rashes, lesions  LINE: port        Test Results:    Results    LABS  BCR/ABL P190: undetectable (Summer 2024)  BCR/ABL P190: 47/100,000 cells (11/01/2023)  AST: 27 (10/17/2023)  AST: 752 (11/19/2023)  ALT: 56 (10/17/2023)  ALT: 1663 (11/19/2023)  Alkaline phosphatase: 283 (11/19/2023)    PATHOLOGY  Bone marrow biopsy: not specified (10/17/2023)      LABS  Hb: 11.8 (01/24/2024)  WBC: 6.2 (01/24/2024)  PLT: 154 (01/24/2024)  Glucose: 209 (01/24/2024)  Bilirubin: 0.4 (01/24/2024)  Cr: 0.6 (01/24/2024)    PATHOLOGY  Bone marrow biopsy: No detectable BCR-ABL, MRD by flow cytometry negative (01/15/2024)  Lumbar puncture: No blasts (01/09/2024)  Lumbar puncture: No blasts (12/27/2023)  Lumbar puncture: No blasts (12/18/2023)  Lumbar puncture: No blasts (12/11/2023)

## 2024-01-24 ENCOUNTER — Ambulatory Visit: Admit: 2024-01-24 | Discharge: 2024-01-24 | Payer: PRIVATE HEALTH INSURANCE

## 2024-01-24 ENCOUNTER — Inpatient Hospital Stay: Admit: 2024-01-24 | Discharge: 2024-01-24 | Payer: PRIVATE HEALTH INSURANCE

## 2024-01-24 ENCOUNTER — Inpatient Hospital Stay
Admit: 2024-01-24 | Discharge: 2024-01-24 | Payer: PRIVATE HEALTH INSURANCE | Attending: Student in an Organized Health Care Education/Training Program | Primary: Student in an Organized Health Care Education/Training Program

## 2024-01-24 ENCOUNTER — Other Ambulatory Visit: Admit: 2024-01-24 | Discharge: 2024-01-24 | Payer: PRIVATE HEALTH INSURANCE

## 2024-01-24 ENCOUNTER — Ambulatory Visit
Admit: 2024-01-24 | Discharge: 2024-01-24 | Payer: PRIVATE HEALTH INSURANCE | Attending: Hematology | Primary: Hematology

## 2024-01-24 DIAGNOSIS — C9100 Acute lymphoblastic leukemia not having achieved remission: Principal | ICD-10-CM

## 2024-01-24 DIAGNOSIS — Z7682 Awaiting organ transplant status: Principal | ICD-10-CM

## 2024-01-24 LAB — CBC W/ AUTO DIFF
BASOPHILS ABSOLUTE COUNT: 0.1 10*9/L (ref 0.0–0.1)
BASOPHILS RELATIVE PERCENT: 1 %
EOSINOPHILS ABSOLUTE COUNT: 0.1 10*9/L (ref 0.0–0.5)
EOSINOPHILS RELATIVE PERCENT: 2.1 %
HEMATOCRIT: 34 % (ref 34.0–44.0)
HEMOGLOBIN: 11.8 g/dL (ref 11.3–14.9)
LYMPHOCYTES ABSOLUTE COUNT: 1.3 10*9/L (ref 1.1–3.6)
LYMPHOCYTES RELATIVE PERCENT: 20.6 %
MEAN CORPUSCULAR HEMOGLOBIN CONC: 34.8 g/dL (ref 32.0–36.0)
MEAN CORPUSCULAR HEMOGLOBIN: 35.3 pg — ABNORMAL HIGH (ref 25.9–32.4)
MEAN CORPUSCULAR VOLUME: 101.5 fL — ABNORMAL HIGH (ref 77.6–95.7)
MEAN PLATELET VOLUME: 9.4 fL (ref 6.8–10.7)
MONOCYTES ABSOLUTE COUNT: 0.6 10*9/L (ref 0.3–0.8)
MONOCYTES RELATIVE PERCENT: 9.7 %
NEUTROPHILS ABSOLUTE COUNT: 4.1 10*9/L (ref 1.8–7.8)
NEUTROPHILS RELATIVE PERCENT: 66.6 %
PLATELET COUNT: 154 10*9/L (ref 150–450)
RED BLOOD CELL COUNT: 3.34 10*12/L — ABNORMAL LOW (ref 3.95–5.13)
RED CELL DISTRIBUTION WIDTH: 15.8 % — ABNORMAL HIGH (ref 12.2–15.2)
WBC ADJUSTED: 6.2 10*9/L (ref 3.6–11.2)

## 2024-01-24 LAB — COMPREHENSIVE METABOLIC PANEL
ALBUMIN: 4.2 g/dL (ref 3.4–5.0)
ALKALINE PHOSPHATASE: 74 U/L (ref 46–116)
ALT (SGPT): 32 U/L (ref 10–49)
ANION GAP: 9 mmol/L (ref 5–14)
AST (SGOT): 33 U/L (ref <=34)
BILIRUBIN TOTAL: 0.4 mg/dL (ref 0.3–1.2)
BLOOD UREA NITROGEN: 23 mg/dL (ref 9–23)
BUN / CREAT RATIO: 38
CALCIUM: 10.2 mg/dL (ref 8.7–10.4)
CHLORIDE: 105 mmol/L (ref 98–107)
CO2: 26 mmol/L (ref 20.0–31.0)
CREATININE: 0.6 mg/dL (ref 0.55–1.02)
EGFR CKD-EPI (2021) FEMALE: >90 mL/min/{1.73_m2} (ref >=60)
GLUCOSE RANDOM: 209 mg/dL — ABNORMAL HIGH (ref 70–179)
POTASSIUM: 4 mmol/L (ref 3.4–4.8)
PROTEIN TOTAL: 7.1 g/dL (ref 5.7–8.2)
SODIUM: 140 mmol/L (ref 135–145)

## 2024-01-24 LAB — HEMATOPATHOLOGY LEUKEMIA/LYMPHOMA FLOW CYTOMETRY, CSF
LYMPHS CSF: 97 %
MONO/MACROPHAGE CSF: 2 %
NEUTROPHILS, CSF: 1 %
NUCLEATED CELLS, CSF: 5 ul (ref <=5)
NUMBER OF CELLS CSF: 100
RBC CSF: 164 ul — ABNORMAL HIGH (ref <2)

## 2024-01-24 LAB — HLA ANTIBODY SCREEN

## 2024-01-24 MED ADMIN — diphenhydrAMINE (BENADRYL) capsule/tablet 25 mg: 25 mg | ORAL | @ 20:00:00 | Stop: 2024-01-24

## 2024-01-24 MED ADMIN — acetaminophen (TYLENOL) tablet 650 mg: 650 mg | ORAL | @ 20:00:00 | Stop: 2024-01-24

## 2024-01-24 MED ADMIN — dextrose 5 % infusion: 30 mL/h | INTRAVENOUS | @ 20:00:00 | Stop: 2024-01-24

## 2024-01-24 MED ADMIN — traMADol (ULTRAM) tablet 50 mg: 50 mg | ORAL | @ 19:00:00 | Stop: 2024-01-24

## 2024-01-24 MED ADMIN — midazolam (VERSED) injection 2 mg: 2 mg | INTRAVENOUS | @ 19:00:00 | Stop: 2024-01-24

## 2024-01-24 MED ADMIN — immun glob G(IgG)-pro-IgA 0-50 (PRIVIGEN) 10 % intravenous solution 30 g: .4 g/kg | INTRAVENOUS | @ 20:00:00 | Stop: 2024-01-24

## 2024-01-24 MED ADMIN — methotrexate (Preservative Free) 12 mg, hydrocortisone sod succ (Solu-CORTEF) 50 mg in sodium chloride (NS) 0.9 % 6 mL INTRATHECAL syringe: INTRATHECAL | @ 19:00:00 | Stop: 2024-01-24

## 2024-01-24 MED FILL — CETIRIZINE 10 MG TABLET: ORAL | 90 days supply | Qty: 90 | Fill #0

## 2024-01-24 NOTE — Unmapped (Signed)
 Lumbar Puncture Procedure Note    Today's Date: 01/24/2024    Diagnosis: Problem List[1]    Indications:    Diagnosis ICD-10-CM Associated Orders   1. 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)     C91.00 Treatment conditions     Physician communication order     OKAY TO SEND MEDICATION/CHEMOTHERAPY TO OUTPATIENT UNIT     methotrexate  (Preservative Free) 12 mg, hydrocortisone  sod succ (Solu-CORTEF ) 50 mg in sodium chloride  (NS) 0.9 % 6 mL INTRATHECAL syringe     Treatment conditions     Physician communication order     midazolam  (VERSED ) injection 2 mg     traMADol  (ULTRAM ) tablet 50 mg     CSF protein     CSF protein     Glucose, CSF     Glucose, CSF     Hempath Leukemia Flowcytometry, CSF     Hempath Leukemia Flowcytometry, CSF      2. Chemotherapy induced neutropenia  D70.1 midazolam  (VERSED ) injection 2 mg    T45.1X5A traMADol  (ULTRAM ) tablet 50 mg          Premedication: Versed  2mg  IV    Confirmed Driver: yes    Clinician(s) Performing Procedure:  Corean Cart, PA-C    Procedure Details     Consent: The risks, benefits, and alternatives to the procedure were discussed. All questions were answered. Consent was obtained and witnessed.    A time-out in which Her patient identifiers were checked by 2 providers was performed.    The patient was positioned under sterile conditions. Betadine solution and sterile drapes were utilized. Local anesthesia with 1% lidocaine  was applied subcutaneously then deep to the skin. A spinal needle was inserted at the L3 - L4 interspace. Spinal fluid was obtained and sent to the laboratory.    Intrathecal MTX was administered over 4 min.    The spinal needle with trocar was removed with minimal bleeding noted upon removal. A sterile bandage was placed over the puncture site after holding pressure.        Findings  6mL of clear spinal fluid was obtained.    Complications:  None; patient tolerated the procedure well.          Condition: stable    Plan  Bed rest for 1 hours.  Tylenol  650 mg for pain.  Call office if you develop a severe headache, nausea, vomiting, or fever greater than 100.5 F.  Tramadol  50 mg given post procedure for tailbone pain            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)       Acute cough    Dyspepsia    Chronic frontal sinusitis    Pancytopenia (CMS-HCC)    Neutropenia with fever (CMS-HCC)    Anxiety    Hypogammaglobulinemia (HHS-HCC)    Hypokalemia    Acute tonsillitis    Allergic conjunctivitis    Chest pain with low risk for cardiac etiology    Chronic back pain    Chronic pansinusitis    Depression    Dog bite    Generalized anxiety disorder    Heart palpitations    Hydronephrosis, right    Hypertension    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

## 2024-01-24 NOTE — Unmapped (Signed)
 Pt arrived to bed 10 in outpatient oncology infusion for bone marrow biopsy, accompanied by family member. Pt accessed via IV, with positive blood return. Consent obtained for biopsy. TIME OUT preformed prior to administration of Versed. Bone marrow biopsy completed, no concerns.

## 2024-01-24 NOTE — Unmapped (Signed)
 Hospital Outpatient Visit on 01/24/2024   Component Date Value Ref Range Status    Protein, CSF 01/24/2024 37  20 - 59 mg/dL Final    Glucose, CSF 93/74/7974 119 (H)  48 - 79 mg/dL Final    Tube # CSF 93/74/7974 Tube 1   Final    Color, CSF 01/24/2024 Colorless   Final    Appearance, CSF 01/24/2024 Clear   Final    Nucleated Cells, CSF 01/24/2024 5  <=5 ul Final    RBC, CSF 01/24/2024 164 (H)  <2 ul Final    #Cells counted for Diff 01/24/2024 100   Final    Neutrophil %, CSF 01/24/2024 1.0  % Final    Lymphs %, CSF 01/24/2024 97.0  % Final    Mono/Macrophage %, CSF 01/24/2024 2.0  % Final   Lab on 01/24/2024   Component Date Value Ref Range Status    HLA Antibody Screen 01/24/2024 Specimen Received   Final    All procedures and reagents have been validated and performance characteristics determined by the [facility]. Certain of these tests have not been cleared/approved by the U.S. Food and Drug Administration (FDA). The FDA has determined   that such approval/clearance is not necessary because this laboratory is certified under the Clinical Laboratory Improvement Amendments to perform high complexity testing. This test is used for clinical purposes. It should not be regarded as   investigational or for research. All HLA typings are performed using molecular methodologies. HLA-A, B, C, DR, and DQ results are serological equivalents of the molecular type and reported as recognized WHO nomenclature for serologic specificities.    Certain molecularly defined HLA alleles do not have associated WHO defined serologic specificities (For example, HLA-C*12, 14, 15, 16, 17,18) and are reported according to the low resolution molecular type (C12, 14, 15, 16, 17,18).    Sodium 01/24/2024 140  135 - 145 mmol/L Final    Potassium 01/24/2024 4.0  3.4 - 4.8 mmol/L Final    Chloride 01/24/2024 105  98 - 107 mmol/L Final    CO2 01/24/2024 26.0  20.0 - 31.0 mmol/L Final    Anion Gap 01/24/2024 9  5 - 14 mmol/L Final BUN 01/24/2024 23  9 - 23 mg/dL Final    Creatinine 93/74/7974 0.60  0.55 - 1.02 mg/dL Final    BUN/Creatinine Ratio 01/24/2024 38   Final    eGFR CKD-EPI (2021) Female 01/24/2024 >90  >=60 mL/min/1.74m2 Final    eGFR calculated with CKD-EPI 2021 equation in accordance with [employer] and [employer] of Nephrology Task Force recommendations.    Glucose 01/24/2024 209 (H)  70 - 179 mg/dL Final    Calcium  01/24/2024 10.2  8.7 - 10.4 mg/dL Final    Albumin 93/74/7974 4.2  3.4 - 5.0 g/dL Final    Total Protein 01/24/2024 7.1  5.7 - 8.2 g/dL Final    Total Bilirubin 01/24/2024 0.4  0.3 - 1.2 mg/dL Final    AST 93/74/7974 33  <=34 U/L Final    ALT 01/24/2024 32  10 - 49 U/L Final    Alkaline Phosphatase 01/24/2024 74  46 - 116 U/L Final    WBC 01/24/2024 6.2  3.6 - 11.2 10*9/L Final    RBC 01/24/2024 3.34 (L)  3.95 - 5.13 10*12/L Final    HGB 01/24/2024 11.8  11.3 - 14.9 g/dL Final    HCT 93/74/7974 34.0  34.0 - 44.0 % Final    MCV 01/24/2024 101.5 (H)  77.6 -  95.7 fL Final    MCH 01/24/2024 35.3 (H)  25.9 - 32.4 pg Final    MCHC 01/24/2024 34.8  32.0 - 36.0 g/dL Final    RDW 93/74/7974 15.8 (H)  12.2 - 15.2 % Final    MPV 01/24/2024 9.4  6.8 - 10.7 fL Final    Platelet 01/24/2024 154  150 - 450 10*9/L Final    Neutrophils % 01/24/2024 66.6  % Final    Lymphocytes % 01/24/2024 20.6  % Final    Monocytes % 01/24/2024 9.7  % Final    Eosinophils % 01/24/2024 2.1  % Final    Basophils % 01/24/2024 1.0  % Final    Absolute Neutrophils 01/24/2024 4.1  1.8 - 7.8 10*9/L Final    Absolute Lymphocytes 01/24/2024 1.3  1.1 - 3.6 10*9/L Final    Absolute Monocytes 01/24/2024 0.6  0.3 - 0.8 10*9/L Final    Absolute Eosinophils 01/24/2024 0.1  0.0 - 0.5 10*9/L Final    Absolute Basophils 01/24/2024 0.1  0.0 - 0.1 10*9/L Final

## 2024-01-24 NOTE — Unmapped (Signed)
 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  Patient arrived with her spouse Devaughn. Provided  BMT Patient Handbook, National Marrow Donor Program website information and coordinator contact information.  Fertility preservation information was denied.  She has not had a menstrual cycle since 2023. Provided pre-transplant teaching regarding purpose of a BMT, HLA typing, donor search, timing as conveyed by Dr.Jamieson, hospitalization requirements during transplant, post-transplant expectations including 24/7 caregiver and the need to remain in the [facility] area for at least the first 100 days as well as long-term follow-up requirements.  Patient had her last dental visit approximately 2-3 months ago.  No further dental evaluation required before transplant.      Initial and confirmatory HLA typing, antibody screen completed.  Parents not typed due to age, sibling with medical concerns. Will complete NMDP donor search.  Patient lives approximately away from [facility].  They are considering staying at a family rental vs [facility] for post discharge recovery. She does voice financial stressors and is interested in discussing resources that may become available.  Patient verbalized an understanding of teaching and will contact me for further information prn.     Total Time Spent: 90 mins  Time spent in direct patient care: 

## 2024-01-24 NOTE — Unmapped (Signed)
 Patient arrived to unit with no s/s of distress.  Patient verbalized no complaints.  Infusion competed without adverse events.  Patient stable at time of discharge.  Patient ambulated out of unit with family member and without difficulty

## 2024-01-25 ENCOUNTER — Other Ambulatory Visit
Admit: 2024-01-25 | Discharge: 2024-01-27 | Disposition: A | Discharge status: Home / Self Care | Payer: PRIVATE HEALTH INSURANCE

## 2024-01-25 ENCOUNTER — Inpatient Hospital Stay
Admit: 2024-01-25 | Discharge: 2024-01-27 | Disposition: A | Discharge status: Home / Self Care | Payer: PRIVATE HEALTH INSURANCE

## 2024-01-25 ENCOUNTER — Encounter
Admit: 2024-01-25 | Discharge: 2024-01-27 | Disposition: A | Discharge status: Home / Self Care | Payer: PRIVATE HEALTH INSURANCE

## 2024-01-25 DIAGNOSIS — C9100 Acute lymphoblastic leukemia not having achieved remission: Principal | ICD-10-CM

## 2024-01-25 LAB — HEPATIC FUNCTION PANEL
ALBUMIN: 4.1 g/dL (ref 3.4–5.0)
ALKALINE PHOSPHATASE: 74 U/L (ref 46–116)
ALT (SGPT): 29 U/L (ref 10–49)
AST (SGOT): 23 U/L (ref <=34)
BILIRUBIN DIRECT: <0.1 mg/dL (ref 0.00–0.30)
BILIRUBIN TOTAL: 0.3 mg/dL (ref 0.3–1.2)
PROTEIN TOTAL: 8 g/dL (ref 5.7–8.2)

## 2024-01-25 LAB — CBC W/ AUTO DIFF
BASOPHILS ABSOLUTE COUNT: 0.1 10*9/L (ref 0.0–0.1)
BASOPHILS RELATIVE PERCENT: 1 %
EOSINOPHILS ABSOLUTE COUNT: 0.1 10*9/L (ref 0.0–0.5)
EOSINOPHILS RELATIVE PERCENT: 2 %
HEMATOCRIT: 34.1 % (ref 34.0–44.0)
HEMOGLOBIN: 11.7 g/dL (ref 11.3–14.9)
LYMPHOCYTES ABSOLUTE COUNT: 1.4 10*9/L (ref 1.1–3.6)
LYMPHOCYTES RELATIVE PERCENT: 20.6 %
MEAN CORPUSCULAR HEMOGLOBIN CONC: 34.5 g/dL (ref 32.0–36.0)
MEAN CORPUSCULAR HEMOGLOBIN: 35 pg — ABNORMAL HIGH (ref 25.9–32.4)
MEAN CORPUSCULAR VOLUME: 101.6 fL — ABNORMAL HIGH (ref 77.6–95.7)
MEAN PLATELET VOLUME: 9.9 fL (ref 6.8–10.7)
MONOCYTES ABSOLUTE COUNT: 0.7 10*9/L (ref 0.3–0.8)
MONOCYTES RELATIVE PERCENT: 11.1 %
NEUTROPHILS ABSOLUTE COUNT: 4.4 10*9/L (ref 1.8–7.8)
NEUTROPHILS RELATIVE PERCENT: 65.3 %
PLATELET COUNT: 167 10*9/L (ref 150–450)
RED BLOOD CELL COUNT: 3.35 10*12/L — ABNORMAL LOW (ref 3.95–5.13)
RED CELL DISTRIBUTION WIDTH: 16.2 % — ABNORMAL HIGH (ref 12.2–15.2)
WBC ADJUSTED: 6.8 10*9/L (ref 3.6–11.2)

## 2024-01-25 LAB — BASIC METABOLIC PANEL
ANION GAP: 11 mmol/L (ref 5–14)
BLOOD UREA NITROGEN: 20 mg/dL (ref 9–23)
BUN / CREAT RATIO: 38
CALCIUM: 9.7 mg/dL (ref 8.7–10.4)
CHLORIDE: 108 mmol/L — ABNORMAL HIGH (ref 98–107)
CO2: 21 mmol/L (ref 20.0–31.0)
CREATININE: 0.53 mg/dL — ABNORMAL LOW (ref 0.55–1.02)
EGFR CKD-EPI (2021) FEMALE: >90 mL/min/{1.73_m2} (ref >=60)
GLUCOSE RANDOM: 123 mg/dL (ref 70–179)
POTASSIUM: 3.6 mmol/L (ref 3.5–5.1)
SODIUM: 140 mmol/L (ref 135–145)

## 2024-01-25 LAB — MAGNESIUM: MAGNESIUM: 1.9 mg/dL (ref 1.6–2.6)

## 2024-01-25 LAB — C-REACTIVE PROTEIN: C-REACTIVE PROTEIN: <5 mg/L (ref <=10.0)

## 2024-01-25 LAB — PHOSPHORUS: PHOSPHORUS: 3 mg/dL (ref 2.4–5.1)

## 2024-01-25 MED ADMIN — insulin lispro (HumaLOG) injection CORRECTIONAL 0-20 Units: 0-20 [IU] | SUBCUTANEOUS | @ 23:00:00

## 2024-01-25 NOTE — Unmapped (Addendum)
 CRS Grading and Management     CRS Grading CRS Management     Grade 1:?    Fever >= 38??C, not attributable to any other cause    Hypotension: none   Hypoxia: none   ?   - Supportive care (ie, antipyretics, IV hydration)   - Vital signs every 30 minutes for 2 hours after symptoms onset, pulse oximetry, twice daily CMPs   - For Initial Fever: Follow Fever SOP. Use clinical judgment for subsequent fevers  - If on blinatumomab , continue blinatumomab    Grade 2:?   Fever >= 38??C, not attributable to any other cause    plus Hypotension: not requiring vasopressors    And/or Hypoxia: requiring low-flow nasal cannula (ie, oxygen delivered at <= 6 L/min) or blow-by      *Hypotension: SBP <?90 mm Hg?or if symptomatic    ?   - Notify Attending Physician   - IV fluid bolus and/or oxygen as needed   - Cardiac tele, vital signs every 30 minutes for 2 hours after symptoms onset, pulse oximetry, twice daily CMPs   - Tocilizumab 8 mg/kg IV over 1 hour (max dose 800 mg/dose).  Repeat every 8 hours if no improvement. Limit to a maximum of three doses in a 24-hour period, with a maximum of four doses total   - Hypotension after 2L boluses (consider LR) and after 1-2 doses of tocilizumab, consider dexamethasone  10 mg IV every 12 hours for 1-2 doses   - Manage per Grade 3 if no improvement within 24 hours of starting tocilizumab   - If on blinatumomab  and not fluid responsive after 2L, stop blinatumomab *   Grade 3:?    Fever >= 38??C, not attributable to any other cause   plus Hypotension: requiring a vasopressor with or without vasopressin    And/or Hypoxia: requiring high-flow nasal cannula, facemask, nonrebreather mask, or Venturi mask      *Hypotension: SBP <?90 mm Hg?or if symptomatic    ?   - Notify Attending Physician   - Grade 2 Supportive care and include vasopressors as needed    - Consider ECHO to assess cardiac function and conduct hemodynamic monitoring   - Tocilizumab as per Grade 2. See Grade 2 for dosing and max # of doses.  - PLUS dexamethasone  10 mg IV every 6 hours (or equivalent) and rapidly taper once symptoms improve   - If refractory despite max dose tocilizumab and dexamethasone , manage as per Grade 4   - If on Monumen-TAL, contact MD prior to steroid  - If on blinatumomab , stop blinatumomab  infusion*   Grade 4:? Life threatening   Fever >= 38??C, not attributable to any other cause    -plus Hypotension: requiring multiple vasopressors (excluding vasopressin)      -And/or Hypoxia: requiring positive pressure (eg, CPAP, BiPAP, intubation, and mechanical ventilation)      SBP <?90 mm Hg?or if symptomatic    ?   - Notify Attending Physician   - Continue supportive care as per Grade 3 plus mechanical ventilation as needed   - Tocilizumab as per Grade 2. See Grade 2 for dosing and max # of doses.  - Initiate high-dose methylprednisolone  at a dose of 500 mg IV every 12 hours for 3 days, followed by 250 mg IV every 12 hours for 2 days, 125 mg IV every 12 hours for 2 days, and 60 mg IV every 12 hours until CRS improvement to Grade 1    - If not improving, consider  methylprednisolone  1,000 mg IV 2 times a day  - If on Monumen-TAL, contact MD prior to steroid  - If on blinatumomab , stop blinatumomab  infusion*   *If blinatumomab  drug infusion is restarted >=?4 hours after initial stop time, administer premedication 30 minutes prior to drug re-initiation   Reference: ASCO Guidelines DOI [employer]   **If patient is receiving elranatamab report all grade CRS and ICANs to appropriate REMS program  ?    Neurotoxicity Grading and Management      Use ICE (Immune Effector Cell-Associated Encephalopathy) score and ICANS (Immune Effector Cell-Associated Neurotoxicity Syndrome) grading in combination when determining management of neurotoxicity      Immune Effector Cell-Associated Encephalopathy (ICE) Score   Orientation  Year, Month, City, Hospital  4 Points    Naming  Ability to name 3 objects (eg point to clock, pen, button)  3 Points    Follow Commands  Ability to follow simple commands (eg ???Show me 2 fingers,??? ???Close your eyes and stick out your tongue.???  1 Point    Writing  Ability to write a standard sentence (eg ???Our national bird is the bald [place]???)  1 Point    Attention  Ability to count backwards from 100 to 0 by 10  1 Point         Total: 10 points            ICANS Grading System: ICANS grade is determined by the most severe event (ICE score, level of consciousness, seizure, motor findings, raised ICP/cerebral edema) not attributable to any other cause; for example, a patient with an ICE score of 3 who has a generalized seizure is classified as grade 3 ICANS   Neurotoxicity Domain  Grade 1  Grade 2  Grade 3  Grade 4    ICE Score  7-9  3-6  0-2  0 (unarousable and unable to perform ICE)    Depressed LOC  Awakens spontaneously  Awakens to voice  Awakens only to tactile stimulus  Unarousable or requires continuous vigorous or repetitive stimuli to arouse. Stupor or coma.    Seizure  N/A  N/A  Any clinical seizure (focal or generalized) that resolves rapidly, or nonconvulsive seizures on EEG that resolve with intervention  Life-threatening prolonged seizure (>5 min); Or repetitive clinical or electrical seizures without return to baseline in between    Motor findings  N/A  N/A  N/A  Deep focal motor weakness such as hemiparesis or paraparesis    Increased ICP/Cerebral Palsy  N/A  N/A  Focal/local edema on neuroimaging  Diffuse cerebral edema on neuroimaging; decerebrate or decorticate posturing; or cranial nerve VI palsy; or papilledema; or Cushing's triad             ?Neurotoxicity Management:    Grading  Management (neurotoxicity only)    Grade 1:?  Notify covering provider   Daily neuro exam   If on blinatumomab , continue blinatumomab    Grade 2:?   ?  Notify Attending Physician    Notify covering provider   1?dose of dexamethasone  10 mg IV and reassess. Can repeat every 6-12 hours, if no improvement.   Daily neuro exam   If on blinatumomab , continue blinatumomab    Grade 3:?      ?  Notify Attending Physician   ICU level of care recommended   START Dexamethasone  10 mg IV Q6H?or methylprednisolone  1 mg/kg IV?Q12H   Anti-epileptic if concern for seizures (ie keppra)  If on blinatumomab , stop blinatumomab *    Grade 4:?      ?  Notify Attending Physician   ICU level of care recommended   START methylprednisolone  IV 1000 mg/day (may consider twice a day) for 3 days, followed by rapid taper at 250 mg every 12 h for 2 days, 125 mg every 12 hours for 2 days, and 60 mg every 12 hours for 2 days   Treat convulsive status epilepticus  If on blinatumomab , stop blinatumomab *?     *If blinatumomab  drug infusion is restarted >=?4 hours after initial stop time, administer premedication 30 minutes prior to drug re-initiation   Reference: NCCN Guidelines Version 1.2022 https://www.brown-roberts.net/.pdf    **If patient is receiving elranatamab report all grade CRS and ICANs to appropriate REMS program    Patricia [name] is a 48 y.o. 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 She tolerated well and had no signs/sx of CRS/ICANS. She is discharging on D3. She will begin bag changes in the infusion center on 6/28. She currently has 3 IT treatments scheduled but only needs one at the end of this cycle which is being adjusted. Her further detailed hospital course and follow up are noted below.        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 Initially diagnosed in March 2023 and started on therapy on GRAAPH protocol. Recently in maintence when she presented with CNS relapse. BMBx shows MRD+ and therefore started on Blinatumomab . She is s/p cycle 1 of which she tolerated well. She presents for cycle 2. Her most recent LP with IT treatment was completed on 6/24 and flow is still pending upon admission. She also continues on Ponatinib .   - C2D1 Blina + Potantinib = 6/26  - Valtrex , Dapsone  ppx   - ASA daily      Disposition   - Bag changes scheduled starting 6/28   - LP with IT treatments scheduled for 7/24  - Follow up in clinic needs to be scheduled      T2DM: Chronic condition. Takes Lantus  and SSI at home. Will continue home regimen and titrate as needed with steroid use.      HTN - HLD: Chronic conditions, taking Losartan  at home. She self-discontinued Rosuvastatin .      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 Has follow up scheduled for 7/2. Continues on Letermovir .      Anxiety/Depression: Chronic condition, takes Lexapro  at home, will continue upon admission.     Hemorrhoids, Anal fissure: persistent but stable, per patient.

## 2024-01-25 NOTE — Unmapped (Signed)
 Malignant Hematology APP H&P    Date of Service: 01/25/2024  Primary Oncologist: Dr. Estelle  Oncology Attending: Dr. Kingsley    Assessment/Plan: Patricia [name] is a 48 y.o. 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 Initially diagnosed in March 2023 and started on therapy on GRAAPH protocol. Recently in maintence when she presented with CNS relapse. BMBx shows MRD+ and therefore started on Blinatumomab . She is s/p cycle 1 of which she tolerated well. She presents for cycle 2. Her most recent LP with IT treatment was completed on 6/24 and flow is still pending upon admission. She also continues on Ponatinib .   - C2D1 Blina + Potantinib = 6/26  - Valtrex , Dapsone  ppx     Primary Oncologist:   Chemotherapy: Blinatumomab  (for MRD)  Cycle: 2   - Blinatumomab  28 mcg days 1-3 (as inpatient)  - Plan to move timing of dose 4 to convenient time for home health bag changes  - Blinatumomab  28 mcg days 4-28 as an outpatient  - Dexamethasone  16 mg day 1    [ ]  Assess for neuro toxicity - ICE score daily, include handwriting  [ ]  Assess for cytokine release syndrome    Toxicities  - Frequent toxicities - pyrexia (60%), headache (34%), febrile neutropenia (28%), peripheral edema (26%), nausea (24%), hypokalemia (24%), constipation (21%), anemia (20%)  - 52% neurologic toxicity (mostly grade 1-2)    Disposition   - Bag changes scheduled starting 6/28   - LP with IT treatments x 2 scheduled for 7/8, 7/24, and 7/31   - Follow up in clinic needs to be scheduled     Ok to treat order placed: 6/26    Future Appointments   Date Time Provider Department Center   01/25/2024 12:00 PM ONCINF CHAIR 37 [facility]   01/27/2024  9:30 AM ONCINF CHAIR 12 [facility]   01/29/2024  8:45 AM ONCINF CHAIR 23 [facility]   01/30/2024  9:30 AM Polly Alyce Dover, MD [facility]   01/31/2024 10:00 AM Sheena Orren Liner, MD [facility]   02/01/2024  8:00 AM ONCINF CHAIR 23 [facility]   02/06/2024  1:30 PM ADULT ONC LAB [facility]   02/06/2024  3:30 PM Iva Alyce Ned, AGNP [facility]   02/08/2024  8:30 AM ONCINF CHAIR 24 [facility]   02/15/2024  8:30 AM ONCINF CHAIR 23 [facility]   02/22/2024 11:00 AM ADULT ONC LAB [facility]   02/22/2024  1:00 PM Luanna Asberry Lolling, AGNP [facility]   02/26/2024  8:00 AM ONCINF CHAIR 12 [facility]   02/29/2024 11:00 AM ADULT ONC LAB [facility]   02/29/2024 12:15 PM ONCINF CHAIR 14 [facility]   02/29/2024  1:00 PM Sawchak, Asberry Lolling, AGNP [facility]     Other Problems     T2DM: Chronic condition. Takes Lantus  and SSI at home. Will continue home regimen and titrate as needed with steroid use.   - Lantus  22 units nightly   - Lispro SSI     HTN - HLD: Chronic conditions, taking Losartan  at home. She self-discontinued Rosuvastatin .   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 Has follow up scheduled for 7/2. Continues on Letermovir .   - Continue  letermovir     Anxiety/Depression: Chronic condition, takes Lexapro  at home, will continue upon admission.  - Lexapro  daily      Hemorrhoids, Anal fissure: persistent but stable, per patient.   - Miralax  PRN for constipation     Cancer related fatigue:  Patient endorses fatigue with onset of cancer symptoms or treatment.  Patient reports continued fatigue but endorse some improvement.   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   - Antimicrobial prophylaxis as above       DVT Ppx: no  FEN:  - fluids: no  - electrolytes: manually replace   - diet: regular     Need for PT: no  Anticipated Discharge Date: 6/28    Code Status:   Full Code, confirmed on admission      HPI: Patricia [name] is a 48 y.o. 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     Since last seen here, she continues to feel well. She is experiencing some intermittent constipation. She also has been feeling some right ear fullness. Otherwise, she denies new HA, confusion, blurry vision, gait abnormalities, fever, SOB, CP, or N/V.     Review of Systems: A ten point review of systems was performed. Pertinent positives are listed above, all others are negative.    Oncologic History:   Primary Oncologist: Dr. Estelle  Hematology/Oncology History Overview Note   Referring/Local Oncologist:    Diagnosis:   10/29/2021  Bone marrow, left iliac, aspiration and biopsy  -  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 The majority of the abnormal cells (64/88) contained an additional BCR/ABL1 fusion signal, while 9/88 abnormal cells contained an additional ABL1 and ASS1 signal.       Genetics:   Karyotype/FISH:   RESULTS   Date Value Ref Range Status   10/28/2021   Final    NOTE: This report reflects a combined study from a peripheral blood and a bone marrow core biopsy. Eleven cells from the peripheral blood and nine cells from the bone marrow core biopsy were analyzed. The BCR/ABL1 FISH analysis was performed on the peripheral blood.     Abnormal Karyotype:  47,XX,t(9;22)(q34;q11.2),+der(22)t(9;22)[18]/46,XX[2]    Abnormal FISH:  A BCR/ABL1 interphase FISH assay showed a signal pattern consistent with a BCR::ABL1 rearrangement and the 9;22 translocation in 88% of the 100 cells scored. The majority of the abnormal cells (64/88) contained an additional BCR/ABL1 fusion signal, while 9/88 abnormal cells contained an additional ABL1 and ASS1 signal.           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  MRD by flow insufficient, p190 2/100,000  12/10/2021: Cycle 2 B cycle + rituximab   12/14/21: IT#4  12/17/21: IT#5  01/10/22: Cycle 3 A cycle + rituximab   01/11/22: IT #6  01/24/22: Bmbx - MRD-neg by PCR (p190)   02/17/22: C4 B cycle + ritux  02/18/22: IT#7  03/21/22: C5 A cycle + ritux (4th dose)  03/22/22: IT#8  04/29/22: Bmbx - MRD-neg by PCR (p190)   05/18/22: restart dasatinib  70mg  s/p neutropenia  05/26/22: Start maintenance vincristine  2mg , Prednisone  200mg  D1-5   06/02/22: IT #9  06/27/22: C2 Maintenance: vincristine  2mg , Prednisone  200mg  D1-5, HOLD dasatinib  due to neutropenia  07/04/22: RESTART dasatinib  70mg   07/21/22: IT #10  07/26/22: C3 Maintenance: vincristine  2mg , Prednisone  200mg  D1-5, dasatinib  70 mg  08/08/22: IT #11 - prophylaxis completed  08/23/22: C4 Maintenance: vincristine  2mg , Prednisone  200mg  D1-5, dasatinib  70 mg  09/20/22: C5 Maintenance: vincristine  2mg , Prednisone  200mg  D1-5, dasatinib  70 mg  10/04/22: Bone marrow biopsy - continued remission, MRD and BCR/ABL p190 negative  10/18/22: C6 Maintenance. ANC 0.4, HOLD dasatinib , continue vincristine  2mg  and Prednisone  200mg  D1-5  11/15/22: C7 Maintenance ANC 0.4. HOLD TKI, continue vincristine  2mg  and Prednisone  200mg  D1-5  ~11/30/22: Start ponatinib  15 mg daily  12/13/22: C8 Maintenance ANC 1.2. ponatinib  15mg , vincristine  2mg  D1 and Prednisone  200mg  D1-5  01/10/23: C9 Maintenance Anc 0.7. HOLD ponatinib , continue vincristine  2mg  D1 and Prednisone  200mg  D1-5  01/26/23: ANC 1.7, restart ponatinib  15 mg  02/08/23: C10 Maintenance. ANC is 1.4 - continue ponatinib   03/07/23: C11 Maintenance ANC is 1.1. continue ponatinib   04/06/23: C12 Maintenance  04/12/23: Bmbx - continued remission, MRD-negative, BCR/ABL undetectable  05/02/23: C13 Maintenance  05/30/23: C14 Maintenance  06/27/23: C15 Maintenance PB p210 2/100,000  07/23/23: C16 Maintenance PB p210 2/100,000  08/22/23: C17 Maintenance PB p210 4/100,000  09/19/23: C18 Maintenance PB p210 2/100,000  10/16/23: C19 Maintenance - HOLD prednisone  due to hyperglycemia  10/16/23: Bmbx - CR, MRD-neg by flow, p210 47/100,000   11/01/23: Increased ponatinib  to 30 mg daily  11/26/23: CNS relapse - IT chemo   11/30/23: IT chemo - blasts present   12/01/23: Bmbx - MRD+ disease, 0.14% by flow, p190 241/100,000   12/04/23: IT chemo - blasts present   12/07/23: IT chemo - blasts present   12/07/23: IT chemo - no definitive blasts - 1/8 clear   12/12/23: C1D1 Blinatumomab    12/11/23: IT chemo - no blasts - 2/8 clear  12/18/23: IT chemo - no blasts - 3/8 clear   12/27/23: IT chemo - no blasts - 4/8 clear   01/09/24: IT chemo - no blasts - 5/8 clear   01/15/24: Bmbx - MRD negative by flow and p190   01/24/24: IT chemo          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)      10/29/2021 Initial Diagnosis    B-cell acute lymphoblastic leukemia (ALL) (CMS-HCC)     10/30/2021 - 03/30/2022 Chemotherapy    IP/OP LEUKEMIA GRAAPH-2005 < 60 YO (OP PEGFILGRASTIM  ON DAY 7)  [No description for this plan]     01/06/2022 - 04/07/2022 Endocrine/Hormone Therapy    OP LEUPROLIDE  (LUPRON ) 11.25 MG EVERY 3 MONTHS  Plan Provider: Toribio JONELLE Bunker, MD     05/26/2022 -  Chemotherapy    OP LEUKEMIA VINCRISTINE   vinCRIStine  2 mg IV on day 1     07/26/2022 - 07/26/2022 Endocrine/Hormone Therapy    OP LEUPROLIDE  (LUPRON ) 11.25 MG EVERY 3 MONTHS  Plan Provider: Toribio JONELLE Bunker, MD         Medical History:  PCP: Bunker Toribio JONELLE, MD  Past Medical History[1] Surgical History:  Past Surgical History[2]   Social History:  Social  History [3]  Living situation: the patient lives with his/her family  Functional Status:     ECOG Performance Status: 1 - Restricted in physically strenuous activity but ambulatory and able to carry out work of a light or sedentary nature, e.g., light house work, office work       Family History[4]  Patient reports no further family history of blood disorders or blood cancers upon admission.      Allergies: is allergic to erythromycin, other, sulfa (sulfonamide antibiotics), and azithromycin.    Medications:  Medications Ordered Prior to Encounter[5]    Objective:   Temp:  [36.7 ??C (98 ??F)-36.9 ??C (98.5 ??F)] 36.9 ??C (98.5 ??F)  Pulse:  [65-89] 73  Resp:  [16-18] 16  BP: (127-137)/(81-91) 137/87  MAP (mmHg):  [103] 103          Physical Exam:  General: Resting, in no apparent distress, SO present at bedside.  HEENT: PERRL. No scleral icterus or conjunctival injection. MMM.   CV: RRR.  S1, S2 normal.  No murmurs, gallops, or rubs. Extremities appear well-perfused.    Resp: Breathing is unlabored, and patient is speaking full sentences with ease. CTAB.   GI: Soft, non-distended, non-tender.   Skin: Warm, dry, intact. No rashes, petechiae, or purpura on exam.   Musculoskeletal: No grossly-evident joint effusions or deformities.    Psychiatric: Attentive, calm, cooperative, conversant, euthymic mood, appropriate affect, organized thinking free of delusion, reasonable insight and judgement.  Neurologic: A&Ox4. No focal deficits. ICE 10/10.   CVAD: R CW Port - nontender, no erythema or exudate; Dressing CDI.      Test Results  Data Review:  All lab results last 24 hours:    Recent Results (from the past 24 hours)   CSF protein    Collection Time: 01/24/24  3:28 PM   Result Value Ref Range    Protein, CSF 37 20 - 59 mg/dL   Glucose, CSF    Collection Time: 01/24/24  3:28 PM   Result Value Ref Range    Glucose, CSF 119 (H) 48 - 79 mg/dL   Hempath Leukemia Flowcytometry, CSF    Collection Time: 01/24/24  3:28 PM   Result Value Ref Range    Tube # CSF Tube 1     Color, CSF Colorless Appearance, CSF Clear     Nucleated Cells, CSF 5 <=5 ul    RBC, CSF 164 (H) <2 ul    #Cells counted for Diff 100     Neutrophil %, CSF 1.0 %    Lymphs %, CSF 97.0 %    Mono/Macrophage %, CSF 2.0 %   Basic Metabolic Panel    Collection Time: 01/25/24 11:56 AM   Result Value Ref Range    Sodium 140 135 - 145 mmol/L    Potassium 3.6 3.5 - 5.1 mmol/L    Chloride 108 (H) 98 - 107 mmol/L    CO2 21.0 20.0 - 31.0 mmol/L    Anion Gap 11 5 - 14 mmol/L    BUN 20 9 - 23 mg/dL    Creatinine 9.46 (L) 0.55 - 1.02 mg/dL    BUN/Creatinine Ratio 38     eGFR CKD-EPI (2021) Female >90 >=60 mL/min/1.52m2    Glucose 123 70 - 179 mg/dL    Calcium  9.7 8.7 - 10.4 mg/dL   Magnesium  Level    Collection Time: 01/25/24 11:56 AM   Result Value Ref Range    Magnesium  1.9 1.6 - 2.6 mg/dL   Phosphorus Level  Collection Time: 01/25/24 11:56 AM   Result Value Ref Range    Phosphorus 3.0 2.4 - 5.1 mg/dL   Hepatic function panel    Collection Time: 01/25/24 11:56 AM   Result Value Ref Range    Albumin 4.1 3.4 - 5.0 g/dL    Total Protein 8.0 5.7 - 8.2 g/dL    Total Bilirubin 0.3 0.3 - 1.2 mg/dL    Bilirubin, Direct <9.89 0.00 - 0.30 mg/dL    AST 23 <=65 U/L    ALT 29 10 - 49 U/L    Alkaline Phosphatase 74 46 - 116 U/L   C-reactive protein    Collection Time: 01/25/24 11:56 AM   Result Value Ref Range    CRP <5.0 <=10.0 mg/L   CBC w/ Differential    Collection Time: 01/25/24 11:56 AM   Result Value Ref Range    WBC 6.8 3.6 - 11.2 10*9/L    RBC 3.35 (L) 3.95 - 5.13 10*12/L    HGB 11.7 11.3 - 14.9 g/dL    HCT 65.8 65.9 - 55.9 %    MCV 101.6 (H) 77.6 - 95.7 fL    MCH 35.0 (H) 25.9 - 32.4 pg    MCHC 34.5 32.0 - 36.0 g/dL    RDW 83.7 (H) 87.7 - 15.2 %    MPV 9.9 6.8 - 10.7 fL    Platelet 167 150 - 450 10*9/L    Neutrophils % 65.3 %    Lymphocytes % 20.6 %    Monocytes % 11.1 %    Eosinophils % 2.0 %    Basophils % 1.0 %    Absolute Neutrophils 4.4 1.8 - 7.8 10*9/L    Absolute Lymphocytes 1.4 1.1 - 3.6 10*9/L    Absolute Monocytes 0.7 0.3 - 0.8 10*9/L Absolute Eosinophils 0.1 0.0 - 0.5 10*9/L    Absolute Basophils 0.1 0.0 - 0.1 10*9/L    Anisocytosis Slight (A) Not Present     Imaging: None    Total time spent with patient and including counseling and coordination of care: 1 Hour      Elsie CHRISTELLA Jacobs, PA-C   Hematology/Oncology Department   [facility]   Pager: [phone]    01/25/24    CRS Grading and Management     CRS Grading CRS Management     Grade 1:?    Fever >= 38??C, not attributable to any other cause    Hypotension: none   Hypoxia: none   ?   - Supportive care (ie, antipyretics, IV hydration)   - Vital signs every 30 minutes for 2 hours after symptoms onset, pulse oximetry, twice daily CMPs   - For Initial Fever: Follow Fever SOP. Use clinical judgment for subsequent fevers  - If on blinatumomab , continue blinatumomab    Grade 2:?   Fever >= 38??C, not attributable to any other cause    plus Hypotension: not requiring vasopressors    And/or Hypoxia: requiring low-flow nasal cannula (ie, oxygen delivered at <= 6 L/min) or blow-by      *Hypotension: SBP <?90 mm Hg?or if symptomatic    ?   - Notify Attending Physician   - IV fluid bolus and/or oxygen as needed   - Cardiac tele, vital signs every 30 minutes for 2 hours after symptoms onset, pulse oximetry, twice daily CMPs   - Tocilizumab 8 mg/kg IV over 1 hour (max dose 800 mg/dose).  Repeat every 8 hours if no improvement. Limit to a maximum of three doses in a  24-hour period, with a maximum of four doses total   - Hypotension after 2L boluses (consider LR) and after 1-2 doses of tocilizumab, consider dexamethasone  10 mg IV every 12 hours for 1-2 doses   - Manage per Grade 3 if no improvement within 24 hours of starting tocilizumab   - If on blinatumomab  and not fluid responsive after 2L, stop blinatumomab *   Grade 3:?    Fever >= 38??C, not attributable to any other cause   plus Hypotension: requiring a vasopressor with or without vasopressin    And/or Hypoxia: requiring high-flow nasal cannula, facemask, nonrebreather mask, or Venturi mask      *Hypotension: SBP <?90 mm Hg?or if symptomatic    ?   - Notify Attending Physician   - Grade 2 Supportive care and include vasopressors as needed    - Consider ECHO to assess cardiac function and conduct hemodynamic monitoring   - Tocilizumab as per Grade 2. See Grade 2 for dosing and max # of doses.  - PLUS dexamethasone  10 mg IV every 6 hours (or equivalent) and rapidly taper once symptoms improve   - If refractory despite max dose tocilizumab and dexamethasone , manage as per Grade 4   - If on Monumen-TAL, contact MD prior to steroid  - If on blinatumomab , stop blinatumomab  infusion*   Grade 4:? Life threatening   Fever >= 38??C, not attributable to any other cause    -plus Hypotension: requiring multiple vasopressors (excluding vasopressin)      -And/or Hypoxia: requiring positive pressure (eg, CPAP, BiPAP, intubation, and mechanical ventilation)      SBP <?90 mm Hg?or if symptomatic    ?   - Notify Attending Physician   - Continue supportive care as per Grade 3 plus mechanical ventilation as needed   - Tocilizumab as per Grade 2. See Grade 2 for dosing and max # of doses.  - Initiate high-dose methylprednisolone  at a dose of 500 mg IV every 12 hours for 3 days, followed by 250 mg IV every 12 hours for 2 days, 125 mg IV every 12 hours for 2 days, and 60 mg IV every 12 hours until CRS improvement to Grade 1    - If not improving, consider methylprednisolone  1,000 mg IV 2 times a day  - If on Monumen-TAL, contact MD prior to steroid  - If on blinatumomab , stop blinatumomab  infusion*   *If blinatumomab  drug infusion is restarted >=?4 hours after initial stop time, administer premedication 30 minutes prior to drug re-initiation   Reference: ASCO Guidelines DOI GotForum.co.uk   **If patient is receiving elranatamab report all grade CRS and ICANs to appropriate REMS program  ?    Neurotoxicity Grading and Management      Use ICE (Immune Effector Cell-Associated Encephalopathy) score and ICANS (Immune Effector Cell-Associated Neurotoxicity Syndrome) grading in combination when determining management of neurotoxicity      Immune Effector Cell-Associated Encephalopathy (ICE) Score   Orientation  Year, Month, City, Hospital  4 Points    Naming  Ability to name 3 objects (eg point to clock, pen, button)  3 Points    Follow Commands  Ability to follow simple commands (eg ???Show me 2 fingers,??? ???Close your eyes and stick out your tongue.???  1 Point    Writing  Ability to write a standard sentence (eg ???Our national bird is the bald [place]???)  1 Point    Attention  Ability to count backwards from 100 to 0 by 10  1 Point  Total: 10 points            ICANS Grading System: ICANS grade is determined by the most severe event (ICE score, level of consciousness, seizure, motor findings, raised ICP/cerebral edema) not attributable to any other cause; for example, a patient with an ICE score of 3 who has a generalized seizure is classified as grade 3 ICANS   Neurotoxicity Domain  Grade 1  Grade 2  Grade 3  Grade 4    ICE Score  7-9  3-6  0-2  0 (unarousable and unable to perform ICE)    Depressed LOC  Awakens spontaneously  Awakens to voice  Awakens only to tactile stimulus  Unarousable or requires continuous vigorous or repetitive stimuli to arouse. Stupor or coma.    Seizure  N/A  N/A  Any clinical seizure (focal or generalized) that resolves rapidly, or nonconvulsive seizures on EEG that resolve with intervention  Life-threatening prolonged seizure (>5 min); Or repetitive clinical or electrical seizures without return to baseline in between    Motor findings  N/A  N/A  N/A  Deep focal motor weakness such as hemiparesis or paraparesis    Increased ICP/Cerebral Palsy  N/A  N/A  Focal/local edema on neuroimaging  Diffuse cerebral edema on neuroimaging; decerebrate or decorticate posturing; or cranial nerve VI palsy; or papilledema; or Cushing's triad  ?Neurotoxicity Management:    Grading  Management (neurotoxicity only)    Grade 1:?  Notify covering provider   Daily neuro exam   If on blinatumomab , continue blinatumomab    Grade 2:?   ?  Notify Attending Physician    Notify covering provider   1?dose of dexamethasone  10 mg IV and reassess. Can repeat every 6-12 hours, if no improvement.   Daily neuro exam   If on blinatumomab , continue blinatumomab    Grade 3:?      ?  Notify Attending Physician   ICU level of care recommended   START Dexamethasone  10 mg IV Q6H?or methylprednisolone  1 mg/kg IV?Q12H   Anti-epileptic if concern for seizures (ie keppra)  If on blinatumomab , stop blinatumomab *    Grade 4:?      ?  Notify Attending Physician   ICU level of care recommended   START methylprednisolone  IV 1000 mg/day (may consider twice a day) for 3 days, followed by rapid taper at 250 mg every 12 h for 2 days, 125 mg every 12 hours for 2 days, and 60 mg every 12 hours for 2 days   Treat convulsive status epilepticus  If on blinatumomab , stop blinatumomab *?     *If blinatumomab  drug infusion is restarted >=?4 hours after initial stop time, administer premedication 30 minutes prior to drug re-initiation   Reference: NCCN Guidelines Version 1.2022 https://www.brown-roberts.net/.pdf    **If patient is receiving elranatamab report all grade CRS and ICANs to appropriate REMS program           [1]   Past Medical History:  Diagnosis Date    B-cell acute lymphoblastic leukemia (ALL)    10/29/2021    EBV infection    [2]   Past Surgical History:  Procedure Laterality Date    BREAST CYST EXCISION Right     age 63    CHEMOTHERAPY      leukemia    IR INSERT PORT AGE GREATER THAN 5 YRS  12/08/2021    IR INSERT PORT AGE GREATER THAN 5 YRS 12/08/2021 Charmaine Arloa Provencal, PA IMG [facility]   [3]   Social History  Socioeconomic  History    Marital status: Married   Tobacco Use    Smoking status: Former     Current packs/day: 0.00     Types: Cigarettes Quit date: 11/12/2015     Years since quitting: 8.2    Smokeless tobacco: Never   Substance and Sexual Activity    Alcohol use: Not Currently    Drug use: Never    Sexual activity: Yes     Partners: Male     Social Drivers of Health     Financial Resource Strain: Low Risk  (11/20/2023)    Received from [facility]    Overall Financial Resource Strain (CARDIA)     Difficulty of Paying Living Expenses: Not very hard   Food Insecurity: No Food Insecurity (11/20/2023)    Received from [facility]    Hunger Vital Sign     Within the past 12 months, you worried that your food would run out before you got the money to buy more.: Never true     Within the past 12 months, the food you bought just didn't last and you didn't have money to get more.: Never true   Transportation Needs: No Transportation Needs (11/20/2023)    Received from [facility] - Transportation     In the past 12 months, has lack of transportation kept you from medical appointments or from getting medications?: No     Lack of Transportation (Non-Medical): No   Housing: Low Risk  (11/20/2023)    Received from [facility]    Housing Stability Vital Sign     In the last 12 months, was there a time when you were not able to pay the mortgage or rent on time?: No     In the past 12 months, how many times have you moved where you were living?: 0     At any time in the past 12 months, were you homeless or living in a shelter (including now)?: No   Recent Concern: Housing - High Risk (10/30/2023)    Received from [facility]    Housing Stability Vital Sign     Unable to Pay for Housing in the Last Year: Yes     Number of Times Moved in the Last Year: 0     Homeless in the Last Year: No   [4] No family history on file.  [5]   Current Facility-Administered Medications on File Prior to Encounter   Medication Dose Route Frequency Provider Last Rate Last Admin    [COMPLETED] acetaminophen  (TYLENOL ) tablet 650 mg  650 mg Oral Once Estelle Toribio SAUNDERS, MD   650 mg at 01/24/24 1557    [EXPIRED] dextrose  5 % infusion  30 mL/hr Intravenous Continuous Estelle Toribio SAUNDERS, MD 30 mL/hr at 01/24/24 1557 30 mL/hr at 01/24/24 1557    [EXPIRED] dextrose  5 % infusion  60 mL/hr Intravenous Continuous PRN Estelle Toribio SAUNDERS, MD        [COMPLETED] diphenhydrAMINE  (BENADRYL ) capsule/tablet 25 mg  25 mg Oral Once Estelle Toribio SAUNDERS, MD   25 mg at 01/24/24 1557    [COMPLETED] immun glob G(IgG)-pro-IgA 0-50 (PRIVIGEN ) 10 % intravenous solution 30 g  0.4 g/kg (Adjusted) Intravenous Once Gallagher, Sean Thomas, AGNP   Stopped at 01/24/24 1820    [COMPLETED] methotrexate  (Preservative Free) 12 mg, hydrocortisone  sod succ (Solu-CORTEF ) 50 mg in sodium chloride  (NS) 0.9 % 6 mL INTRATHECAL syringe  Intrathecal Once Iva Alyce Ned, AGNP   Given at 01/24/24 1441    [COMPLETED] midazolam  (VERSED ) injection 2 mg  2 mg Intravenous Once Dawson, Stephanie Brianna, PA   2 mg at 01/24/24 1443    [COMPLETED] traMADol  (ULTRAM ) tablet 50 mg  50 mg Oral Once Letha Corean Rattan, PA   50 mg at 01/24/24 1529     Current Outpatient Medications on File Prior to Encounter   Medication Sig Dispense Refill    acetaminophen  (TYLENOL  8 HOUR) 650 MG CR tablet Take 2 tablets (1,300 mg total) by mouth every eight (8) hours as needed for pain.      albuterol  HFA 90 mcg/actuation inhaler Inhale 2 puffs every six (6) hours as needed for wheezing. 8 g 1    aspirin  (ECOTRIN) 81 MG tablet Take 1 tablet (81 mg total) by mouth daily.      blood sugar diagnostic (GLUCOSE BLOOD) Strp Use to check blood sugar two times a day. 100 each 0    blood-glucose meter kit Use to check blood sugar two times a day. Use as instructed. 1 each 0    cetirizine  (ZYRTEC ) 10 MG tablet Take 1 tablet (10 mg total) by mouth nightly. 90 tablet 3    dapsone  100 MG tablet Take 1 tablet (100 mg total) by mouth daily. TAKE 1 TABLET(100 MG) BY MOUTH DAILY 30 tablet 11    escitalopram  oxalate (LEXAPRO ) 10 MG tablet Take 1 tablet (10 mg total) by mouth daily. 90 tablet 2    insulin  glargine (LANTUS  SOLOSTAR U-100 INSULIN ) 100 unit/mL (3 mL) injection pen Inject 0.3 mL (30 Units total) under the skin nightly.      lancets Misc Use to check blood sugar as directed 2 times a day & for symptoms of high or low blood sugar. 100 each 0    letermovir  (PREVYMIS ) 480 mg tablet Take 1 tablet (480 mg total) by mouth daily. 28 tablet 5    losartan  (COZAAR ) 50 MG tablet Take 0.5 tablets (25 mg total) by mouth daily.      PONATinib  (ICLUSIG ) 30 mg tablet Take 1 tablet (30 mg total) by mouth daily. Swallow tablets whole. Do not crush, break, cut or chew tablets. 30 tablet 5    [Paused] rosuvastatin  (CRESTOR ) 10 MG tablet Take 1 tablet (10 mg total) by mouth nightly. 90 tablet 3    valACYclovir  (VALTREX ) 500 MG tablet Take 1 tablet (500 mg total) by mouth daily. 30 tablet 12

## 2024-01-25 NOTE — Unmapped (Signed)
 Pt transferred to 4 Onc from infusion. Pt chemo orders checked. Pt given insulin  per MAR. Pt ponatinib  at bedside for pharmacist to verify and label. Pt admission completed. Pt free of falls or injuries with all safety measures maintained.   Problem: Adult Inpatient Plan of Care  Goal: Plan of Care Review  Outcome: Ongoing - Unchanged  Goal: Patient-Specific Goal (Individualized)  Outcome: Ongoing - Unchanged  Goal: Absence of Hospital-Acquired Illness or Injury  Outcome: Ongoing - Unchanged  Intervention: Identify and Manage Fall Risk  Recent Flowsheet Documentation  Taken 01/25/2024 1808 by Yakelin Grenier P, RN  Safety Interventions:   low bed   lighting adjusted for tasks/safety   infection management   nonskid shoes/slippers when out of bed   room near unit station  Intervention: Prevent Skin Injury  Recent Flowsheet Documentation  Taken 01/25/2024 1808 by Twania Bujak P, RN  Device Skin Pressure Protection:   absorbent pad utilized/changed   adhesive use limited  Skin Protection:   adhesive use limited   cleansing with dimethicone incontinence wipes   drying agents applied  Intervention: Prevent Infection  Recent Flowsheet Documentation  Taken 01/25/2024 1808 by Chalonda Schlatter P, RN  Infection Prevention:   hand hygiene promoted   personal protective equipment utilized   rest/sleep promoted   single patient room provided  Goal: Optimal Comfort and Wellbeing  Outcome: Ongoing - Unchanged  Goal: Readiness for Transition of Care  Outcome: Ongoing - Unchanged  Goal: Rounds/Family Conference  Outcome: Ongoing - Unchanged

## 2024-01-26 DIAGNOSIS — C9100 Acute lymphoblastic leukemia not having achieved remission: Principal | ICD-10-CM

## 2024-01-26 LAB — CBC W/ AUTO DIFF
BASOPHILS ABSOLUTE COUNT: 0 10*9/L (ref 0.0–0.1)
BASOPHILS RELATIVE PERCENT: 0.1 %
EOSINOPHILS ABSOLUTE COUNT: 0 10*9/L (ref 0.0–0.5)
EOSINOPHILS RELATIVE PERCENT: 0 %
HEMATOCRIT: 33.3 % — ABNORMAL LOW (ref 34.0–44.0)
HEMOGLOBIN: 11.3 g/dL (ref 11.3–14.9)
LYMPHOCYTES ABSOLUTE COUNT: 0.2 10*9/L — ABNORMAL LOW (ref 1.1–3.6)
LYMPHOCYTES RELATIVE PERCENT: 2.8 %
MEAN CORPUSCULAR HEMOGLOBIN CONC: 34 g/dL (ref 32.0–36.0)
MEAN CORPUSCULAR HEMOGLOBIN: 34.6 pg — ABNORMAL HIGH (ref 25.9–32.4)
MEAN CORPUSCULAR VOLUME: 101.9 fL — ABNORMAL HIGH (ref 77.6–95.7)
MEAN PLATELET VOLUME: 10.1 fL (ref 6.8–10.7)
MONOCYTES ABSOLUTE COUNT: 0.1 10*9/L — ABNORMAL LOW (ref 0.3–0.8)
MONOCYTES RELATIVE PERCENT: 1 %
NEUTROPHILS ABSOLUTE COUNT: 6.2 10*9/L (ref 1.8–7.8)
NEUTROPHILS RELATIVE PERCENT: 96.1 %
PLATELET COUNT: 123 10*9/L — ABNORMAL LOW (ref 150–450)
RED BLOOD CELL COUNT: 3.27 10*12/L — ABNORMAL LOW (ref 3.95–5.13)
RED CELL DISTRIBUTION WIDTH: 15.2 % (ref 12.2–15.2)
WBC ADJUSTED: 6.4 10*9/L (ref 3.6–11.2)

## 2024-01-26 LAB — BASIC METABOLIC PANEL
ANION GAP: 12 mmol/L (ref 5–14)
BLOOD UREA NITROGEN: 20 mg/dL (ref 9–23)
BUN / CREAT RATIO: 29
CALCIUM: 9.1 mg/dL (ref 8.7–10.4)
CHLORIDE: 108 mmol/L — ABNORMAL HIGH (ref 98–107)
CO2: 20 mmol/L (ref 20.0–31.0)
CREATININE: 0.68 mg/dL (ref 0.55–1.02)
EGFR CKD-EPI (2021) FEMALE: >90 mL/min/{1.73_m2} (ref >=60)
GLUCOSE RANDOM: 304 mg/dL — ABNORMAL HIGH (ref 70–179)
POTASSIUM: 4.1 mmol/L (ref 3.4–4.8)
SODIUM: 140 mmol/L (ref 135–145)

## 2024-01-26 LAB — CMV IGG: CMV IGG: POSITIVE — AB

## 2024-01-26 MED ADMIN — dexAMETHasone (DECADRON) 4 mg/mL injection 16 mg: 16 mg | INTRAVENOUS | @ 01:00:00 | Stop: 2024-01-25

## 2024-01-26 MED ADMIN — sodium chloride (NS) 0.9 % flush 10 mL: 10 mL | INTRAVENOUS | @ 16:00:00

## 2024-01-26 MED ADMIN — blinatumomab (BLINCYTO) 28 mcg/day in sodium chloride (non-PVC) 0.9 % IVPB (24-hr INPATIENT CADD infusion): 28 ug/d | INTRAVENOUS | @ 01:00:00 | Stop: 2024-01-27

## 2024-01-26 MED ADMIN — losartan (COZAAR) tablet 25 mg: 25 mg | ORAL | @ 13:00:00

## 2024-01-26 MED ADMIN — insulin lispro (HumaLOG) injection CORRECTIONAL 0-20 Units: 0-20 [IU] | SUBCUTANEOUS | @ 21:00:00

## 2024-01-26 MED ADMIN — letermovir (PREVYMIS) tablet 480 mg: 480 mg | ORAL | @ 01:00:00

## 2024-01-26 MED ADMIN — insulin lispro (HumaLOG) injection CORRECTIONAL 0-20 Units: 0-20 [IU] | SUBCUTANEOUS | @ 16:00:00

## 2024-01-26 MED ADMIN — valACYclovir (VALTREX) tablet 500 mg: 500 mg | ORAL | @ 01:00:00

## 2024-01-26 MED ADMIN — insulin lispro (HumaLOG) injection CORRECTIONAL 0-20 Units: 0-20 [IU] | SUBCUTANEOUS | @ 13:00:00

## 2024-01-26 MED ADMIN — sodium chloride (NS) 0.9 % flush 10 mL: 10 mL | INTRAVENOUS | @ 01:00:00

## 2024-01-26 MED ADMIN — insulin glargine (LANTUS) injection BASAL 22 Units: 22 [IU] | SUBCUTANEOUS | @ 01:00:00

## 2024-01-26 MED ADMIN — aspirin chewable tablet 81 mg: 81 mg | ORAL | @ 01:00:00

## 2024-01-26 MED ADMIN — cetirizine (ZYRTEC) tablet 10 mg: 10 mg | ORAL | @ 13:00:00

## 2024-01-26 MED ADMIN — dapsone tablet 100 mg: 100 mg | ORAL | @ 13:00:00

## 2024-01-26 MED ADMIN — escitalopram oxalate (LEXAPRO) tablet 10 mg: 10 mg | ORAL | @ 13:00:00

## 2024-01-26 NOTE — Unmapped (Signed)
 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     Plan Summary: D2 Blinatumomab  + Ponatinib  = 6/27. Tolerating treatment well. Continue Valtrex , Dapsone  ppx. Further supportive care and chronic meds as noted below.     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 Initially diagnosed in March 2023 and started on therapy on GRAAPH protocol. Recently in maintence when she presented with CNS relapse. BMBx shows MRD+ and therefore started on Blinatumomab . She is s/p cycle 1 of which she tolerated well. She presents for cycle 2. Her most recent LP with IT treatment was completed on 6/24 and flow is still pending upon admission. She also continues on Ponatinib .   - C2D1 Blina + Potantinib = 6/26  - Valtrex , Dapsone  ppx      Primary Oncologist:   Chemotherapy: Blinatumomab  (for MRD)  Cycle: 2   - Blinatumomab  28 mcg days 1-3 (as inpatient)  - Plan to move timing of dose 4 to convenient time for home health bag changes  - Blinatumomab  28 mcg days 4-28 as an outpatient  - Dexamethasone  16 mg day 1    [ ]  Assess for neuro toxicity - ICE score daily, include handwriting  [ ]  Assess for cytokine release syndrome     Toxicities  - Frequent toxicities - pyrexia (60%), headache (34%), febrile neutropenia (28%), peripheral edema (26%), nausea (24%), hypokalemia (24%), constipation (21%), anemia (20%)  - 52% neurologic toxicity (mostly grade 1-2)     Disposition   - Bag changes scheduled starting 6/28   - LP with IT treatments scheduled for 7/8, 7/24, and 7/31   - Follow up in clinic needs to be scheduled     T2DM: Chronic condition. Takes Lantus  and SSI at home. Will continue home regimen and titrate as needed with steroid use.   - Lantus  22 units nightly   - Lispro SSI      HTN - HLD: Chronic conditions, taking Losartan  at home. She self-discontinued Rosuvastatin .   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 Has follow up scheduled for 7/2. Continues on Letermovir .   - Continue letermovir      Anxiety/Depression: Chronic condition, takes Lexapro  at home, will continue upon admission.  - Lexapro  daily      Hemorrhoids, Anal fissure: persistent but stable, per patient.   - Miralax  PRN for constipation      Cancer related fatigue:  Patient endorses fatigue with onset of cancer symptoms or treatment.  Patient reports continued fatigue but endorse some improvement.   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   - Antimicrobial prophylaxis as above    Nutrition:                        Subjective:   Afebrile. [name]. No complaints. Having some elevated sugars given steroid use. Updated on plan of care.     10 point ROS otherwise negative except as above in the HPI.     Objective:     Vital signs in last 24 hours:  Temp:  [36.7 ??  C (98.1 ??F)-37.1 ??C (98.8 ??F)] 36.8 ??C (98.2 ??F)  Pulse:  [67-92] 85  Resp:  [18] 18  BP: (115-126)/(82-91) 126/91  MAP (mmHg):  [93-102] 102  SpO2:  [94 %-97 %] 94 %  BMI (Calculated):  [31.25] 31.25    Intake/Output last 3 shifts:  No intake/output data recorded.    Meds:  Current Medications[1]    Physical Exam:  General: Resting, in no apparent distress, lying in bed  HEENT:  PERRL. No scleral icterus or conjunctival injection. MMM.   Heart:  RRR. S1, S2. No murmurs, gallops, or rubs.  Lungs:  Breathing is unlabored, CTAB.   Abdomen: Soft, nontender, nondistended.   Skin:  No rashes, petechiae or purpura on clothed exam.   Musculoskeletal:  No grossly-evident joint effusions or deformities.    Psychiatric:  Range of affect is appropriate.    Neurologic:  Alert and oriented to person, place, time and situation.  No focal deficits. ICE 10/10.   Extremities:  Appear well-perfused. No clubbing, edema, or cyanosis.  CVAD: R CW Port - no erythema, nontender; dressing CDI.      Labs:  Recent Labs     01/24/24  1159 01/25/24  1156 01/26/24  0340   WBC 6.2 6.8 6.4   NEUTROABS 4.1 4.4 6.2   LYMPHSABS 1.3 1.4 0.2*   HGB 11.8 11.7 11.3   HCT 34.0 34.1 33.3*   PLT 154 167 123*   CREATININE 0.60 0.53* 0.68   BUN 23 20 20    BILITOT 0.4 0.3  --    BILIDIR  --  <0.10  --    AST 33 23  --    ALT 32 29  --    ALKPHOS 74 74  --    K 4.0 3.6 4.1   MG  --  1.9  --    CALCIUM  10.2 9.7 9.1   NA 140 140 140   CL 105 108* 108*   CO2 26.0 21.0 20.0   PHOS  --  3.0  --    CRP  --  <5.0  --        Imaging:  No new.    Patricia HERO Velton Roselle, PA-C  01/26/2024  7:32 AM   Physician Assistant   Hematology/Oncology Department   [facility]   Group pager: [phone]                [1]   Current Facility-Administered Medications   Medication Dose Route Frequency Provider Last Rate Last Admin    albuterol  (PROVENTIL  HFA;VENTOLIN  HFA) 90 mcg/actuation inhaler 2 puff  2 puff Inhalation Q6H PRN Jaqulyn Chancellor, Patricia Corolla, PA        aspirin  chewable tablet 81 mg  81 mg Oral Nightly Blanchard, Gabriel J, AGNP   81 mg at 01/25/24 2117    blinatumomab  (BLINCYTO ) 28 mcg/day in sodium chloride  (non-PVC) 0.9 % IVPB (24-hr INPATIENT CADD infusion)  28 mcg/day Intravenous over 24 hr Estelle Toribio SAUNDERS, MD 10 mL/hr at 01/25/24 2120 32.5 mcg at 01/25/24 2120    cetirizine  (ZYRTEC ) tablet 10 mg  10 mg Oral Nightly Analucia Hush, Patricia Corolla, PA        dapsone  tablet 100 mg  100 mg Oral Daily Marvin Grabill, Patricia Corolla, PA        dexAMETHasone  (DECADRON ) 4 mg/mL injection 20 mg  20 mg Intravenous Once PRN Estelle Toribio SAUNDERS, MD        dextrose  50 % in water (D50W) 50 % solution 12.5 g  12.5 g Intravenous Q15 Min PRN Ariq Khamis, Patricia Corolla, [place]        diphenhydrAMINE  (BENADRYL ) injection 25 mg  25 mg Intravenous Q4H PRN Estelle Toribio SAUNDERS, MD        emollient combination no.92 (LUBRIDERM) lotion 1 Application  1 Application Topical Q1H PRN Jordain Radin, Patricia Corolla, PA        EPINEPHrine (EPIPEN) injection 0.3 mg  0.3 mg Intramuscular Daily PRN Estelle Toribio SAUNDERS, MD        escitalopram  oxalate (LEXAPRO ) tablet 10 mg  10 mg Oral Daily Draylen Lobue, Patricia Corolla, PA        famotidine  (PF) (PEPCID ) injection 20 mg  20 mg Intravenous Q4H PRN Richardson, Daniel R, MD        glucagon injection 1 mg  1 mg Intramuscular Once PRN Montez Stryker, Patricia Corolla, PA        glucose chewable tablet 16 g  16 g Oral Q10 Min PRN Nadir Vasques, Patricia Corolla, PA        insulin  glargine (LANTUS ) injection BASAL 22 Units  22 Units Subcutaneous Nightly Carole Doner, Patricia Corolla, [place]   22 Units at 01/25/24 2111    insulin  lispro (HumaLOG ) injection CORRECTIONAL 0-20 Units  0-20 Units Subcutaneous ACHS Ettore Trebilcock, Patricia Corolla, [place]   1 Units at 01/25/24 1907    IP OKAY TO TREAT   Other Continuous PRN Renny Remer, Patricia Corolla, PA        letermovir  (PREVYMIS ) tablet 480 mg  480 mg Oral Nightly Lawyer Washabaugh, Patricia Corolla, PA   480 mg at 01/25/24 2035    loperamide  (IMODIUM ) capsule 2 mg  2 mg Oral Q2H PRN Amandajo Gonder, Patricia Corolla, PA        loperamide  (IMODIUM ) capsule 4 mg  4 mg Oral Once PRN Bailyn Spackman, Patricia Corolla, PA        losartan  (COZAAR ) tablet 25 mg  25 mg Oral Daily Coraline Talwar, Patricia Corolla, PA        methylPREDNISolone  sodium succinate  (SOLU-Medrol ) injection 125 mg  125 mg Intravenous Q4H PRN Estelle Toribio SAUNDERS, MD        polyethylene glycol (MIRALAX ) packet 17 g  17 g Oral BID PRN Raidon Swanner, Patricia Corolla, PA        PONATinib  (ICLUSIG ) tablet 30 mg  30 mg Oral Daily Estelle Toribio SAUNDERS, MD        prochlorperazine  (COMPAZINE ) injection 10 mg  10 mg Intravenous Q6H PRN Estelle Toribio SAUNDERS, MD        prochlorperazine  (COMPAZINE ) tablet 10 mg  10 mg Oral Q6H PRN Estelle Toribio SAUNDERS, MD        sodium chloride  (NS) 0.9 % flush 10 mL  10 mL Intravenous BID Tzippy Testerman, Patricia Corolla, PA   10 mL at 01/25/24 2048    sodium chloride  (NS) 0.9 % flush 10 mL  10 mL Intravenous BID Kesley Gaffey, Patricia Corolla, PA   10 mL at 01/25/24 2048    sodium chloride  0.9% (NS) bolus 1,000 mL  1,000 mL Intravenous Daily PRN Estelle Toribio SAUNDERS, MD        valACYclovir  (VALTREX ) tablet 500 mg  500 mg Oral Nightly Blanchard, Gabriel J, AGNP   500 mg at 01/25/24 2117

## 2024-01-26 NOTE — Unmapped (Signed)
 Patient vitals stable, ICE score 10/10. Up walking around unit throughout the day!   Problem: Adult Inpatient Plan of Care  Goal: Plan of Care Review  Outcome: Ongoing - Unchanged  Goal: Patient-Specific Goal (Individualized)  Outcome: Ongoing - Unchanged  Goal: Absence of Hospital-Acquired Illness or Injury  Outcome: Ongoing - Unchanged  Intervention: Identify and Manage Fall Risk  Recent Flowsheet Documentation  Taken 01/26/2024 0845 by Milinda Clotilda HERO, RN  Safety Interventions:   low bed   lighting adjusted for tasks/safety   fall reduction program maintained  Intervention: Prevent Skin Injury  Recent Flowsheet Documentation  Taken 01/26/2024 0845 by Milinda Clotilda HERO, RN  Positioning for Skin: Supine/Back  Device Skin Pressure Protection: adhesive use limited  Skin Protection: adhesive use limited  Intervention: Prevent Infection  Recent Flowsheet Documentation  Taken 01/26/2024 0845 by Milinda Clotilda HERO, RN  Infection Prevention: single patient room provided  Goal: Optimal Comfort and Wellbeing  Outcome: Ongoing - Unchanged  Goal: Readiness for Transition of Care  Outcome: Ongoing - Unchanged  Goal: Rounds/Family Conference  Outcome: Ongoing - Unchanged

## 2024-01-27 ENCOUNTER — Inpatient Hospital Stay: Admit: 2024-01-27 | Discharge: 2024-01-27 | Payer: PRIVATE HEALTH INSURANCE

## 2024-01-27 DIAGNOSIS — C9100 Acute lymphoblastic leukemia not having achieved remission: Principal | ICD-10-CM

## 2024-01-27 LAB — CBC W/ AUTO DIFF
BASOPHILS ABSOLUTE COUNT: 0 10*9/L (ref 0.0–0.1)
BASOPHILS RELATIVE PERCENT: 0.5 %
EOSINOPHILS ABSOLUTE COUNT: 0 10*9/L (ref 0.0–0.5)
EOSINOPHILS RELATIVE PERCENT: 0.2 %
HEMATOCRIT: 29.9 % — ABNORMAL LOW (ref 34.0–44.0)
HEMOGLOBIN: 10.5 g/dL — ABNORMAL LOW (ref 11.3–14.9)
LYMPHOCYTES ABSOLUTE COUNT: 0.5 10*9/L — ABNORMAL LOW (ref 1.1–3.6)
LYMPHOCYTES RELATIVE PERCENT: 13.8 %
MEAN CORPUSCULAR HEMOGLOBIN CONC: 35 g/dL (ref 32.0–36.0)
MEAN CORPUSCULAR HEMOGLOBIN: 35.2 pg — ABNORMAL HIGH (ref 25.9–32.4)
MEAN CORPUSCULAR VOLUME: 100.5 fL — ABNORMAL HIGH (ref 77.6–95.7)
MEAN PLATELET VOLUME: 9.9 fL (ref 6.8–10.7)
MONOCYTES ABSOLUTE COUNT: 0.2 10*9/L — ABNORMAL LOW (ref 0.3–0.8)
MONOCYTES RELATIVE PERCENT: 5.8 %
NEUTROPHILS ABSOLUTE COUNT: 3 10*9/L (ref 1.8–7.8)
NEUTROPHILS RELATIVE PERCENT: 79.7 %
PLATELET COUNT: 129 10*9/L — ABNORMAL LOW (ref 150–450)
RED BLOOD CELL COUNT: 2.98 10*12/L — ABNORMAL LOW (ref 3.95–5.13)
RED CELL DISTRIBUTION WIDTH: 15.1 % (ref 12.2–15.2)
WBC ADJUSTED: 3.7 10*9/L (ref 3.6–11.2)

## 2024-01-27 LAB — BASIC METABOLIC PANEL
ANION GAP: 9 mmol/L (ref 5–14)
BLOOD UREA NITROGEN: 15 mg/dL (ref 9–23)
BUN / CREAT RATIO: 24
CALCIUM: 9.3 mg/dL (ref 8.7–10.4)
CHLORIDE: 111 mmol/L — ABNORMAL HIGH (ref 98–107)
CO2: 23 mmol/L (ref 20.0–31.0)
CREATININE: 0.63 mg/dL (ref 0.55–1.02)
EGFR CKD-EPI (2021) FEMALE: >90 mL/min/1.73m2 (ref >=60)
GLUCOSE RANDOM: 121 mg/dL (ref 70–179)
POTASSIUM: 3.7 mmol/L (ref 3.4–4.8)
SODIUM: 143 mmol/L (ref 135–145)

## 2024-01-27 MED ADMIN — aspirin chewable tablet 81 mg: 81 mg | ORAL

## 2024-01-27 MED ADMIN — blinatumomab (BLINCYTO) 28 mcg/day in sodium chloride (non-PVC) 0.9 % IVPB (48-hr OUTPATIENT CADD infusion): 28 ug/d | INTRAVENOUS | @ 17:00:00 | Stop: 2024-01-27

## 2024-01-27 MED ADMIN — letermovir (PREVYMIS) tablet 480 mg: 480 mg | ORAL

## 2024-01-27 MED ADMIN — escitalopram oxalate (LEXAPRO) tablet 10 mg: 10 mg | ORAL | @ 13:00:00 | Stop: 2024-01-27

## 2024-01-27 MED ADMIN — PONATinib (ICLUSIG) tablet 30 mg *Patient Supplied*: 30 mg | ORAL

## 2024-01-27 MED ADMIN — valACYclovir (VALTREX) tablet 500 mg: 500 mg | ORAL

## 2024-01-27 MED ADMIN — dapsone tablet 100 mg: 100 mg | ORAL | @ 13:00:00 | Stop: 2024-01-27

## 2024-01-27 MED ADMIN — sodium chloride (NS) 0.9 % flush 10 mL: 10 mL | INTRAVENOUS

## 2024-01-27 MED ADMIN — insulin glargine (LANTUS) injection BASAL 22 Units: 22 [IU] | SUBCUTANEOUS

## 2024-01-27 MED ADMIN — losartan (COZAAR) tablet 25 mg: 25 mg | ORAL | @ 13:00:00 | Stop: 2024-01-27

## 2024-01-27 MED ADMIN — blinatumomab (BLINCYTO) 28 mcg/day in sodium chloride (non-PVC) 0.9 % IVPB (24-hr INPATIENT CADD infusion): 28 ug/d | INTRAVENOUS | @ 01:00:00 | Stop: 2024-01-27

## 2024-01-27 MED ADMIN — sodium chloride (NS) 0.9 % flush 10 mL: 10 mL | INTRAVENOUS | @ 13:00:00 | Stop: 2024-01-27

## 2024-01-27 MED ADMIN — insulin lispro (HumaLOG) injection CORRECTIONAL 0-20 Units: 0-20 [IU] | SUBCUTANEOUS

## 2024-01-27 MED ADMIN — cetirizine (ZYRTEC) tablet 10 mg: 10 mg | ORAL | @ 13:00:00 | Stop: 2024-01-27

## 2024-01-27 NOTE — Unmapped (Signed)
 Pt arrived to chair 3 from 4 ONC with Blina infusing to outpatient oncology infusion for cycle 2 day 3 of treatment. Pt already accessed via Port, both lumens, upon arrival, with positive blood return. Medial port heparin  locked w/ curios capped+blood return. Lateral port infusing Blina, positive blood return. New RX bag attached and infusing. Treatment day completed, no concerns. Pt discharged ambulatory. AVS declined.

## 2024-01-27 NOTE — Unmapped (Signed)
 RED ZONE Means: RED ZONE: Take action now!     You need to be seen right away  Symptoms are at a severe level of discomfort    Call 911 or go to your nearest  Hospital for help     - Bleeding that will not stop    - Hard to breathe    - New seizure - Chest pain  - Fall or passing out  -Thoughts of hurting    yourself or others      Call 911 if you are going into the RED ZONE                  YELLOW ZONE Means:     Please call with any new or worsening symptom(s), even if not on this list.  Call [phone]  After hours, weekends, and holidays - you will reach a long recording with specific instructions, If not in an emergency such as above, please listen closely all the way to the end and choose the option that relates to your need.   You can be seen by a provider the same day through our Same Day Acute Care for Patients with Cancer program.      YELLOW ZONE: Take action today     Symptoms are new or worsening  You are not within your goal range for:    - Pain    - Shortness of breath    - Bleeding (nose, urine, stool, wound)    - Feeling sick to your stomach and throwing up    - Mouth sores/pain in your mouth or throat    - Hard stool or very loose stools (increase in       ostomy output)    - No urine for 12 hours    - Feeding tube or other catheter/tube issue    - Redness or pain at previous IV or port/catheter site    - Depressed or anxiety   - Swelling (leg, arm, abdomen,     face, neck)  - Skin rash or skin changes  - Wound issues (redness, drainage,    re-opened)  - Confusion  - Vision changes  - Fever >100.4 F or chills  - Worsening cough with mucus that is    green, yellow, or bloody  - Pain or burning when going to the    bathroom  - Home Infusion Pump Issue- call    [phone]         Call your healthcare provider if you are going into the YELLOW ZONE     GREEN ZONE Means:  Your symptoms are under controls  Continue to take your medicine as ordered  Keep all visits to the provider GREEN ZONE: You are in control  No increase or worsening symptoms  Able to take your medicine  Able to drink and eat    - DO NOT use MyChart messages to report red or yellow symptoms. Allow up to 3    business days for a reply.  -MyChart is for non-urgent medication refills, scheduling requests, or other general questions.         [id number] Rev. 01/28/2022  Approved by Oncology Patient Education Committee     Admission on 01/25/2024, Discharged on 01/27/2024   Component Date Value Ref Range Status    Glucose, POC 01/25/2024 160  70 - 179 mg/dL Final    ABO Grouping 93/72/7974 O POS   Final    Antibody Screen 01/26/2024 NEG  Final    Sodium 01/26/2024 140  135 - 145 mmol/L Final    Potassium 01/26/2024 4.1  3.4 - 4.8 mmol/L Final    Chloride 01/26/2024 108 (H)  98 - 107 mmol/L Final    CO2 01/26/2024 20.0  20.0 - 31.0 mmol/L Final    Anion Gap 01/26/2024 12  5 - 14 mmol/L Final    BUN 01/26/2024 20  9 - 23 mg/dL Final    Creatinine 93/72/7974 0.68  0.55 - 1.02 mg/dL Final    BUN/Creatinine Ratio 01/26/2024 29   Final    eGFR CKD-EPI (2021) Female 01/26/2024 >90  >=60 mL/min/1.36m2 Final    eGFR calculated with CKD-EPI 2021 equation in accordance with [employer] and [employer] of Nephrology Task Force recommendations.    Glucose 01/26/2024 304 (H)  70 - 179 mg/dL Final    Calcium  01/26/2024 9.1  8.7 - 10.4 mg/dL Final    WBC 93/72/7974 6.4  3.6 - 11.2 10*9/L Final    RBC 01/26/2024 3.27 (L)  3.95 - 5.13 10*12/L Final    HGB 01/26/2024 11.3  11.3 - 14.9 g/dL Final    HCT 93/72/7974 33.3 (L)  34.0 - 44.0 % Final    MCV 01/26/2024 101.9 (H)  77.6 - 95.7 fL Final    MCH 01/26/2024 34.6 (H)  25.9 - 32.4 pg Final    MCHC 01/26/2024 34.0  32.0 - 36.0 g/dL Final    RDW 93/72/7974 15.2  12.2 - 15.2 % Final    MPV 01/26/2024 10.1  6.8 - 10.7 fL Final    Platelet 01/26/2024 123 (L)  150 - 450 10*9/L Final    Neutrophils % 01/26/2024 96.1  % Final    Lymphocytes % 01/26/2024 2.8  % Final    Monocytes % 01/26/2024 1.0  % Final    Eosinophils % 01/26/2024 0.0  % Final    Basophils % 01/26/2024 0.1  % Final    Absolute Neutrophils 01/26/2024 6.2  1.8 - 7.8 10*9/L Final    Absolute Lymphocytes 01/26/2024 0.2 (L)  1.1 - 3.6 10*9/L Final    Absolute Monocytes 01/26/2024 0.1 (L)  0.3 - 0.8 10*9/L Final    Absolute Eosinophils 01/26/2024 0.0  0.0 - 0.5 10*9/L Final    Absolute Basophils 01/26/2024 0.0  0.0 - 0.1 10*9/L Final    Macrocytosis 01/26/2024 Slight (A)  Not Present Final    Glucose, POC 01/26/2024 281 (H)  70 - 179 mg/dL Final    Glucose, POC 93/72/7974 354 (H)  70 - 179 mg/dL Final    Glucose, POC 93/72/7974 262 (H)  70 - 179 mg/dL Final    Glucose, POC 93/72/7974 198 (H)  70 - 179 mg/dL Final    Sodium 93/71/7974 143  135 - 145 mmol/L Final    Potassium 01/27/2024 3.7  3.4 - 4.8 mmol/L Final    Chloride 01/27/2024 111 (H)  98 - 107 mmol/L Final    CO2 01/27/2024 23.0  20.0 - 31.0 mmol/L Final    Anion Gap 01/27/2024 9  5 - 14 mmol/L Final    BUN 01/27/2024 15  9 - 23 mg/dL Final    Creatinine 93/71/7974 0.63  0.55 - 1.02 mg/dL Final    BUN/Creatinine Ratio 01/27/2024 24   Final    eGFR CKD-EPI (2021) Female 01/27/2024 >90  >=60 mL/min/1.51m2 Final    eGFR calculated with CKD-EPI 2021 equation in accordance with [employer] and [employer] of Nephrology Task Force recommendations.  Glucose 01/27/2024 121  70 - 179 mg/dL Final    Calcium  01/27/2024 9.3  8.7 - 10.4 mg/dL Final    WBC 93/71/7974 3.7  3.6 - 11.2 10*9/L Final    RBC 01/27/2024 2.98 (L)  3.95 - 5.13 10*12/L Final    HGB 01/27/2024 10.5 (L)  11.3 - 14.9 g/dL Final    HCT 93/71/7974 29.9 (L)  34.0 - 44.0 % Final    MCV 01/27/2024 100.5 (H)  77.6 - 95.7 fL Final    MCH 01/27/2024 35.2 (H)  25.9 - 32.4 pg Final    MCHC 01/27/2024 35.0  32.0 - 36.0 g/dL Final    RDW 93/71/7974 15.1  12.2 - 15.2 % Final    MPV 01/27/2024 9.9  6.8 - 10.7 fL Final    Platelet 01/27/2024 129 (L)  150 - 450 10*9/L Final    Neutrophils % 01/27/2024 79.7  % Final Lymphocytes % 01/27/2024 13.8  % Final    Monocytes % 01/27/2024 5.8  % Final    Eosinophils % 01/27/2024 0.2  % Final    Basophils % 01/27/2024 0.5  % Final    Absolute Neutrophils 01/27/2024 3.0  1.8 - 7.8 10*9/L Final    Absolute Lymphocytes 01/27/2024 0.5 (L)  1.1 - 3.6 10*9/L Final    Absolute Monocytes 01/27/2024 0.2 (L)  0.3 - 0.8 10*9/L Final    Absolute Eosinophils 01/27/2024 0.0  0.0 - 0.5 10*9/L Final    Absolute Basophils 01/27/2024 0.0  0.0 - 0.1 10*9/L Final    Macrocytosis 01/27/2024 Slight (A)  Not Present Final    Glucose, POC 01/27/2024 103  70 - 179 mg/dL Final

## 2024-01-27 NOTE — Unmapped (Signed)
 Physician Discharge Summary [facility]  4 ONC [facility]  [address]  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 Blood product support:  Leukoreduced blood products are required.  Irradiated blood products are preferred, but in case of urgent transfusion needs non-irradiated blood products may be used:     -  RBC transfusion threshold: transfuse 2 units for Hgb < 8 g/dL.  -  Platelet transfusion threshold: transfuse 1 unit of platelets for platelet count < 10, or for bleeding or need for invasive procedure.    Based on the patient's disease status and intensity of therapy, complete blood count with differential should be evaluated 1 time per week and used to guide transfusion support    3. Patient needs OP LP with IT Chemotherapy: yes   Intrathecal chemotherapy signed: yes    Hospital Course:   Patricia [name] is a 48 y.o. 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 She tolerated well and had no signs/sx of CRS/ICANS. She is discharging on D3. She will begin bag changes in the infusion center on 6/28. She currently has 3 IT treatments scheduled but only needs one at the end of this cycle which is being adjusted. Her further detailed hospital course and follow up are noted below.     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 Initially diagnosed in March 2023 and started on therapy on GRAAPH protocol. Recently in maintence when she presented with CNS relapse. BMBx shows MRD+ and therefore started on Blinatumomab . She is s/p cycle 1 of which she tolerated well. She presents for cycle 2. Her most recent LP with IT treatment was completed on 6/25, no evidence of disease.   - C2D1 Blina + Potantinib = 6/26  - Valtrex , Dapsone  ppx   - ASA daily      Disposition   - Bag changes scheduled starting 6/28   - LP with IT treatments scheduled for 7/24  - Follow up in clinic needs to be scheduled      T2DM: Chronic condition. Takes Lantus  and SSI at home. Will continue home regimen and titrate as needed with steroid use.   HTN - HLD: Chronic conditions, taking Losartan  at home. She self-discontinued Rosuvastatin .   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 Has follow up scheduled for 7/2. Continues on Letermovir .   Anxiety/Depression: Chronic condition, takes Lexapro  at home, will continue upon admission.  Hemorrhoids, Anal fissure: persistent but stable, per patient.     Procedures:  Chemotherapy  No admission procedures  for hospital encounter.  ______________________________________________________________________  Discharge Medications:     Your Medication List        PAUSE taking these medications      rosuvastatin  10 MG tablet  Wait to take this until your doctor or other care provider tells you to start again.  Commonly known as: CRESTOR   Take 1 tablet (10 mg total) by mouth nightly.            CONTINUE taking these medications      ACCU-CHEK GUIDE GLUCOSE METER Misc  Generic drug: blood-glucose meter  Use to check blood sugar two times a day. Use as instructed.     ACCU-CHEK GUIDE TEST STRIPS Strp  Generic drug: blood sugar diagnostic  Use to check blood sugar two times a day.     ACCU-CHEK SOFTCLIX LANCETS lancets  Generic drug: lancets  Use to check blood sugar as directed 2 times a day & for symptoms of high or low blood sugar.     acetaminophen  650 MG CR tablet  Commonly known as: TYLENOL  8 HOUR  Take 2 tablets (1,300 mg total) by mouth every eight (8) hours as needed for pain.     albuterol  90 mcg/actuation inhaler  Commonly known as: PROVENTIL  HFA;VENTOLIN  HFA  Inhale 2 puffs every six (6) hours as needed for wheezing.     aspirin  81 MG tablet  Commonly known as: ECOTRIN  Take 1 tablet (81 mg total) by mouth daily.     cetirizine  10 MG tablet  Commonly known as: ZYRTEC   Take 1 tablet (10 mg total) by mouth nightly.     dapsone  100 MG tablet  Take 1 tablet (100 mg total) by mouth daily. TAKE 1 TABLET(100 MG) BY MOUTH DAILY     escitalopram  oxalate 10 MG tablet  Commonly known as: LEXAPRO   Take 1 tablet (10 mg total) by mouth daily.     ICLUSIG  30 mg tablet  Generic drug: PONATinib   Take 1 tablet (30 mg total) by mouth daily. Swallow tablets whole. Do not crush, break, cut or chew tablets.     LANTUS  SOLOSTAR U-100 INSULIN  100 unit/mL (3 mL) injection pen  Generic drug: insulin  glargine  Inject 0.22 mL (22 Units total) under the skin nightly.     losartan  50 MG tablet  Commonly known as: COZAAR   Take 1 tablet (50 mg total) by mouth in the morning.     PREVYMIS  480 mg tablet  Generic drug: letermovir   Take 1 tablet (480 mg total) by mouth daily.     valACYclovir  500 MG tablet  Commonly known as: VALTREX   Take 1 tablet (500 mg total) by mouth daily.              Allergies:  Erythromycin, Other, Sulfa (sulfonamide antibiotics), and Azithromycin  ______________________________________________________________________  Pending Test Results (if blank, then none):      Most Recent Labs:  All lab results last 24 hours -   Recent Results (from the past 24 hours)   POCT Glucose    Collection Time: 01/26/24  8:20 PM   Result Value Ref Range    Glucose, POC 198 (H) 70 - 179 mg/dL   Basic Metabolic Panel Collection Time: 01/27/24  3:36 AM   Result Value Ref Range    Sodium 143 135 - 145 mmol/L    Potassium 3.7 3.4 - 4.8 mmol/L    Chloride 111 (H) 98 - 107 mmol/L    CO2 23.0 20.0 - 31.0 mmol/L  Anion Gap 9 5 - 14 mmol/L    BUN 15 9 - 23 mg/dL    Creatinine 9.36 9.44 - 1.02 mg/dL    BUN/Creatinine Ratio 24     eGFR CKD-EPI (2021) Female >90 >=60 mL/min/1.68m2    Glucose 121 70 - 179 mg/dL    Calcium  9.3 8.7 - 10.4 mg/dL   CBC w/ Differential    Collection Time: 01/27/24  3:36 AM   Result Value Ref Range    WBC 3.7 3.6 - 11.2 10*9/L    RBC 2.98 (L) 3.95 - 5.13 10*12/L    HGB 10.5 (L) 11.3 - 14.9 g/dL    HCT 70.0 (L) 65.9 - 44.0 %    MCV 100.5 (H) 77.6 - 95.7 fL    MCH 35.2 (H) 25.9 - 32.4 pg    MCHC 35.0 32.0 - 36.0 g/dL    RDW 84.8 87.7 - 84.7 %    MPV 9.9 6.8 - 10.7 fL    Platelet 129 (L) 150 - 450 10*9/L    Neutrophils % 79.7 %    Lymphocytes % 13.8 %    Monocytes % 5.8 %    Eosinophils % 0.2 %    Basophils % 0.5 %    Absolute Neutrophils 3.0 1.8 - 7.8 10*9/L    Absolute Lymphocytes 0.5 (L) 1.1 - 3.6 10*9/L    Absolute Monocytes 0.2 (L) 0.3 - 0.8 10*9/L    Absolute Eosinophils 0.0 0.0 - 0.5 10*9/L    Absolute Basophils 0.0 0.0 - 0.1 10*9/L    Macrocytosis Slight (A) Not Present   POCT Glucose    Collection Time: 01/27/24  9:11 AM   Result Value Ref Range    Glucose, POC 103 70 - 179 mg/dL       Relevant Studies/Radiology (if blank, then none):  No results found.  ______________________________________________________________________    Activity Instructions       Activity as tolerated              Diet Instructions       Discharge diet (specify)      Discharge Nutrition Therapy: Regular                    Appointments which have been scheduled for you      Jan 29, 2024 8:45 AM  (Arrive by 8:15 AM)  HEM INFUSION ONLY with [facility] CHAIR 23  [place] ONCOLOGY INFUSION [facility] REGION) [address]  [place] [address]  [phone]        Jan 30, 2024 9:30 AM  (Arrive by 9:00 AM)  RETURN VIDEO VISIT MYCHART with Alyce Belvie Gola, MD  [place] ONCOLOGY MULTIDISCIPLINARY 2ND FLR CANCER HOSP [facility] REGION) [address]  [place] [address]  [phone]   Please sign into My [place] Chart at least 15 minutes before your appointment to complete the eCheck-In process. You must complete eCheck-In before you can start your video visit. We also recommend testing your audio and video connection to troubleshoot any issues before your visit begins. Click ???Join Video Visit??? to complete these checks. Once you have completed eCheck-In and tested your audio and video, click ???Join Call??? to connect to your visit.     For your video visit, you will need a computer with a working camera, speaker and microphone, a smartphone, or a tablet with internet access.    My [place] Chart enables you to manage your health, send non-urgent  messages to your provider, view your test results, schedule and manage appointments, and request prescription refills securely and conveniently from your computer or mobile device.    You can go to [email or web address] to sign in to your My [place] Chart account with your username and password. If you have forgotten your username or password, please choose the ???Forgot Username???? and/or ???Forgot Password???? links to gain access. You also can access your My [place] Chart account with the free MyChart mobile app for Android or iPhone.    If you need assistance accessing your My [place] Chart account or for assistance in reaching your provider's office to reschedule or cancel your appointment, please call [facility] [phone].         Jan 31, 2024 10:00 AM  (Arrive by 9:30 AM)  RETURN VIDEO VISIT MYCHART with Orren Freddie Darner, MD  [facility] HEMATOLOGY ONCOLOGY 2ND FLR CANCER HOSP [facility] REGION) [address]  [place] [address]  [phone]   Please sign into My [place] Chart at least 15 minutes before your appointment to complete the eCheck-In process. You must complete eCheck-In before you can start your video visit. We also recommend testing your audio and video connection to troubleshoot any issues before your visit begins. Click ???Join Video Visit??? to complete these checks. Once you have completed eCheck-In and tested your audio and video, click ???Join Call??? to connect to your visit.     For your video visit, you will need a computer with a working camera, speaker and microphone, a smartphone, or a tablet with internet access.    My [place] Chart enables you to manage your health, send non-urgent messages to your provider, view your test results, schedule and manage appointments, and request prescription refills securely and conveniently from your computer or mobile device.    You can go to [email or web address] to sign in to your My [place] Chart account with your username and password. If you have forgotten your username or password, please choose the ???Forgot Username???? and/or ???Forgot Password???? links to gain access. You also can access your My [place] Chart account with the free MyChart mobile app for Android or iPhone.    If you need assistance accessing your My [place] Chart account or for assistance in reaching your provider's office to reschedule or cancel your appointment, please call [facility] [phone].         Feb 01, 2024 8:00 AM  (Arrive by 7:30 AM)  HEM INFUSION ONLY with [facility] CHAIR 23  [place] ONCOLOGY INFUSION [place] [facility] REGION) [address]  [place] [address]  [phone]        Feb 06, 2024 1:30 PM  (Arrive by 1:00 PM)  LAB ONLY [place] with ADULT ONC LAB  [facility] ADULT ONCOLOGY LAB DRAW STATION [place] [facility] REGION) [address]  [place] [address]  [phone]        Feb 06, 2024 3:30 PM  (Arrive by 3:00 PM)  LUMBAR PUNCTURE with Alyce Debby Shaggy, AGNP  [place] ONCOLOGY INFUSION [place] [facility] REGION) [address]  [place] [address]  [phone]        Feb 08, 2024 8:30 AM  (Arrive by 8:00 AM)  HEM INFUSION ONLY with [facility] CHAIR 24  [place] ONCOLOGY INFUSION [place] [facility] REGION) [address]  [place] [address]  [phone]  Feb 15, 2024 8:30 AM  (Arrive by 8:00 AM)  HEM INFUSION ONLY with [facility] CHAIR 23  [place] ONCOLOGY INFUSION [facility] REGION) [address]  [place] [address]  [phone]        Feb 22, 2024 11:00 AM  (Arrive by 10:30 AM)  LAB ONLY [place] with ADULT ONC LAB  [facility] ADULT ONCOLOGY LAB DRAW STATION [place] [facility] REGION) [address]  [place] [address]  [phone]        Feb 22, 2024 1:00 PM  (Arrive by 12:30 PM)  LUMBAR PUNCTURE PROC with Asberry Nemiah Adjutant, AGNP  [place] ONCOLOGY INFUSION [place] [facility] REGION) [address]  [place] [address]  [phone]        Feb 26, 2024 8:00 AM  (Arrive by 7:30 AM)  HEM INFUSION ONLY with [facility] CHAIR 12  [place] ONCOLOGY INFUSION [place] [facility] REGION) [address]  [place] [address]  [phone]        Feb 29, 2024 11:00 AM  (Arrive by 10:30 AM)  LAB ONLY [place] with ADULT ONC LAB  [facility] ADULT ONCOLOGY LAB DRAW STATION [facility] REGION) [address]  [place] [address]  [phone]        Feb 29, 2024 12:15 PM  (Arrive by 11:45 AM)  HEM INFUSION ONLY with [facility] CHAIR 14  [place] ONCOLOGY INFUSION  [facility] REGION) [address]  [place] [address]  [phone]        Feb 29, 2024 1:00 PM  (Arrive by 12:30 PM)  LUMBAR PUNCTURE PROC with Asberry Nemiah Adjutant, AGNP  [place] ONCOLOGY INFUSION  [facility] REGION) [address]  [place] [address]  [phone]             ______________________________________________________________________  Discharge Day Services:  BP 131/84  - Pulse 96  - Temp 36.5 ??C (97.7 ??F) (Oral)  - Resp 16  - Ht 168 cm (5' 6.14)  - Wt 88.2 kg (194 lb 7.1 oz)  - SpO2 99%  - BMI 31.25 kg/m??   Pt seen on the day of discharge and determined appropriate for discharge.    Condition at Discharge: good    Length of Discharge: I spent greater than 30 mins in the discharge of this patient.    Vernell BROCKS. Rhys RIGGERS  Hematology/Oncology Department  January 27, 2024 7:47 PM

## 2024-01-29 ENCOUNTER — Inpatient Hospital Stay: Admit: 2024-01-29 | Discharge: 2024-01-29 | Payer: PRIVATE HEALTH INSURANCE

## 2024-01-29 DIAGNOSIS — C9100 Acute lymphoblastic leukemia not having achieved remission: Principal | ICD-10-CM

## 2024-01-29 LAB — CBC W/ AUTO DIFF
BASOPHILS ABSOLUTE COUNT: 0.1 10*9/L (ref 0.0–0.1)
BASOPHILS RELATIVE PERCENT: 1.4 %
EOSINOPHILS ABSOLUTE COUNT: 0.1 10*9/L (ref 0.0–0.5)
EOSINOPHILS RELATIVE PERCENT: 2.6 %
HEMATOCRIT: 32.8 % — ABNORMAL LOW (ref 34.0–44.0)
HEMOGLOBIN: 11.7 g/dL (ref 11.3–14.9)
LYMPHOCYTES ABSOLUTE COUNT: 1.1 10*9/L (ref 1.1–3.6)
LYMPHOCYTES RELATIVE PERCENT: 27.1 %
MEAN CORPUSCULAR HEMOGLOBIN CONC: 35.7 g/dL (ref 32.0–36.0)
MEAN CORPUSCULAR HEMOGLOBIN: 35.5 pg — ABNORMAL HIGH (ref 25.9–32.4)
MEAN CORPUSCULAR VOLUME: 99.4 fL — ABNORMAL HIGH (ref 77.6–95.7)
MEAN PLATELET VOLUME: 9.4 fL (ref 6.8–10.7)
MONOCYTES ABSOLUTE COUNT: 0.2 10*9/L — ABNORMAL LOW (ref 0.3–0.8)
MONOCYTES RELATIVE PERCENT: 6.1 %
NEUTROPHILS ABSOLUTE COUNT: 2.5 10*9/L (ref 1.8–7.8)
NEUTROPHILS RELATIVE PERCENT: 62.8 %
PLATELET COUNT: 128 10*9/L — ABNORMAL LOW (ref 150–450)
RED BLOOD CELL COUNT: 3.3 10*12/L — ABNORMAL LOW (ref 3.95–5.13)
RED CELL DISTRIBUTION WIDTH: 14.9 % (ref 12.2–15.2)
WBC ADJUSTED: 4 10*9/L (ref 3.6–11.2)

## 2024-01-29 LAB — COMPREHENSIVE METABOLIC PANEL
ALBUMIN: 3.9 g/dL (ref 3.4–5.0)
ALKALINE PHOSPHATASE: 64 U/L (ref 46–116)
ALT (SGPT): 37 U/L (ref 10–49)
ANION GAP: 9 mmol/L (ref 5–14)
AST (SGOT): 28 U/L (ref <=34)
BILIRUBIN TOTAL: 0.4 mg/dL (ref 0.3–1.2)
BLOOD UREA NITROGEN: 16 mg/dL (ref 9–23)
BUN / CREAT RATIO: 30
CALCIUM: 9.3 mg/dL (ref 8.7–10.4)
CHLORIDE: 106 mmol/L (ref 98–107)
CO2: 25 mmol/L (ref 20.0–31.0)
CREATININE: 0.53 mg/dL — ABNORMAL LOW (ref 0.55–1.02)
EGFR CKD-EPI (2021) FEMALE: >90 mL/min/1.73m2 (ref >=60)
GLUCOSE RANDOM: 253 mg/dL — ABNORMAL HIGH (ref 70–179)
POTASSIUM: 4 mmol/L (ref 3.4–4.8)
PROTEIN TOTAL: 7.1 g/dL (ref 5.7–8.2)
SODIUM: 140 mmol/L (ref 135–145)

## 2024-01-29 LAB — MAGNESIUM: MAGNESIUM: 1.8 mg/dL (ref 1.6–2.6)

## 2024-01-29 LAB — PHOSPHORUS: PHOSPHORUS: 2.6 mg/dL (ref 2.4–5.1)

## 2024-01-29 MED ADMIN — blinatumomab (BLINCYTO) 28 mcg/day in sodium chloride (non-PVC) 0.9 % IVPB (72-hr OUTPATIENT CADD infusion): 28 ug/d | INTRAVENOUS | @ 14:00:00

## 2024-01-29 MED ADMIN — heparin, porcine (PF) 100 unit/mL injection 500 Units: 500 [IU] | INTRAVENOUS | @ 14:00:00 | Stop: 2024-01-30

## 2024-01-29 NOTE — Unmapped (Signed)
 RED ZONE Means: RED ZONE: Take action now!     You need to be seen right away  Symptoms are at a severe level of discomfort    Call 911 or go to your nearest  Hospital for help     - Bleeding that will not stop    - Hard to breathe    - New seizure - Chest pain  - Fall or passing out  -Thoughts of hurting    yourself or others      Call 911 if you are going into the RED ZONE                  YELLOW ZONE Means:     Please call with any new or worsening symptom(s), even if not on this list.  Call [phone]  After hours, weekends, and holidays - you will reach a long recording with specific instructions, If not in an emergency such as above, please listen closely all the way to the end and choose the option that relates to your need.   You can be seen by a provider the same day through our Same Day Acute Care for Patients with Cancer program.      YELLOW ZONE: Take action today     Symptoms are new or worsening  You are not within your goal range for:    - Pain    - Shortness of breath    - Bleeding (nose, urine, stool, wound)    - Feeling sick to your stomach and throwing up    - Mouth sores/pain in your mouth or throat    - Hard stool or very loose stools (increase in       ostomy output)    - No urine for 12 hours    - Feeding tube or other catheter/tube issue    - Redness or pain at previous IV or port/catheter site    - Depressed or anxiety   - Swelling (leg, arm, abdomen,     face, neck)  - Skin rash or skin changes  - Wound issues (redness, drainage,    re-opened)  - Confusion  - Vision changes  - Fever >100.4 F or chills  - Worsening cough with mucus that is    green, yellow, or bloody  - Pain or burning when going to the    bathroom  - Home Infusion Pump Issue- call    [phone]         Call your healthcare provider if you are going into the YELLOW ZONE     GREEN ZONE Means:  Your symptoms are under controls  Continue to take your medicine as ordered  Keep all visits to the provider GREEN ZONE: You are in control  No increase or worsening symptoms  Able to take your medicine  Able to drink and eat    - DO NOT use MyChart messages to report red or yellow symptoms. Allow up to 3    business days for a reply.  -MyChart is for non-urgent medication refills, scheduling requests, or other general questions.         [id number] Rev. 01/28/2022  Approved by Oncology Patient Education Committee     Hospital Outpatient Visit on 01/29/2024   Component Date Value Ref Range Status    WBC 01/29/2024 4.0  3.6 - 11.2 10*9/L Final    RBC 01/29/2024 3.30 (L)  3.95 - 5.13 10*12/L Final    HGB 01/29/2024 11.7  11.3 -  14.9 g/dL Final    HCT 93/69/7974 32.8 (L)  34.0 - 44.0 % Final    MCV 01/29/2024 99.4 (H)  77.6 - 95.7 fL Final    MCH 01/29/2024 35.5 (H)  25.9 - 32.4 pg Final    MCHC 01/29/2024 35.7  32.0 - 36.0 g/dL Final    RDW 93/69/7974 14.9  12.2 - 15.2 % Final    MPV 01/29/2024 9.4  6.8 - 10.7 fL Final    Platelet 01/29/2024 128 (L)  150 - 450 10*9/L Final    Neutrophils % 01/29/2024 62.8  % Final    Lymphocytes % 01/29/2024 27.1  % Final    Monocytes % 01/29/2024 6.1  % Final    Eosinophils % 01/29/2024 2.6  % Final    Basophils % 01/29/2024 1.4  % Final    Absolute Neutrophils 01/29/2024 2.5  1.8 - 7.8 10*9/L Final    Absolute Lymphocytes 01/29/2024 1.1  1.1 - 3.6 10*9/L Final    Absolute Monocytes 01/29/2024 0.2 (L)  0.3 - 0.8 10*9/L Final    Absolute Eosinophils 01/29/2024 0.1  0.0 - 0.5 10*9/L Final    Absolute Basophils 01/29/2024 0.1  0.0 - 0.1 10*9/L Final

## 2024-01-29 NOTE — Unmapped (Signed)
 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    Assessment/Recommendations:    Patricia [name] is a 48 y.o. 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 Her CMV viremia has remained low level however she has demonstrated resolution of fevers with treatment of CMV. Now that her CMV VL has been undetected for many months, we can stop monitoring.     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  #Hemorrhoids + anal fissure 05/05/22    Active Infections  #Fever of unknown origin 05/11/22, likely 2/2 CMV viremia without disease  -10/11 Normal CT A/P, CT chest with mild diffuse bronchial wall thickening  -10/11 CT sinus with mild mucosal thickening   -Bld cx neg, urine culture w 10-50K E.faecalis, not treated  -CMV viremia as below in micro    Rx: 12/5-12/26 Valcyte  900mg  PO BID-->12/26-1/24/24 Valcyte  450mg  PO BID-->09/14/22 Valcyte  900mg  PO BID-->10/05/22 letermovir  (and valtrex )    Antimicrobial allergies/intolerances: Erythromycin, Azithro, Sulfa       RECOMMENDATIONS    Diagnostic  If febrile during HCT would consider early testing for CMV PCR    Treatment  Continue letermovir  and valtrex  through HCT    Monitoring for antimicrobial toxicities  Patricia [name] is currently receiving drug therapy requiring intensive lab monitoring for toxicity.  As needed    Prophylaxis  Dapsone   Valtrex   Letermovir     Follow up after HCT          Recommendations were communicated via shared medical record.    History of Present Illness:      Source of information includes:  Electronic Medical Records and Discussion with patient     History of Present Illness  Patricia [name] is a 48 year old female with acute lymphoblastic leukemia who presents for evaluation of Letermovir  use in the context of a planned stem cell transplant.    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 She relapsed in April 2025, resulting in a three-week hospitalization. Currently, she is undergoing treatment with Blinatumomab , and her most recent bone marrow and spinal fluid tests have shown no detectable leukemia cells.    She is on Letermovir  for CMV prophylaxis, with discussions ongoing about its continued use due to her upcoming stem cell transplant. She developed fevers with CMV even though low level viremia and these would recur after stopping valcyte  so we put her on long term letermovir  ppx.  She has not experienced any side effects from Letermovir , such as diarrhea or rashes. Additionally, she is taking Valtrex  for herpes prophylaxis, which has been effective in preventing blisters.     She engages in physical activities such as gardening and selling produce at a farmer's market to build strength and stamina in preparation for her transplant. On ROS she denies fevers or chills, diarrhea, or dysuria, although she sometimes needs to 'force' urination. She has experienced tremors with Blinatumomab  in the past, which resolved after receiving IVIG treatment.  Allergies:  Allergies   Allergen Reactions    Erythromycin Hives     Other reaction(s): Not available    Other      Pt cant take ibuprofen due to condition.    Sulfa (Sulfonamide Antibiotics) Anaphylaxis     Other reaction(s): Not available    Azithromycin Hives     Per patient report       Medications:     Current Outpatient Medications:     acetaminophen  (TYLENOL  8 HOUR) 650 MG CR tablet, Take 2 tablets (1,300 mg total) by mouth every eight (8) hours as needed for pain., Disp: , Rfl:     albuterol  HFA 90 mcg/actuation inhaler, Inhale 2 puffs every six (6) hours as needed for wheezing., Disp: 8 g, Rfl: 1    aspirin  (ECOTRIN) 81 MG tablet, Take 1 tablet (81 mg total) by mouth daily., Disp: , Rfl:     blood sugar diagnostic (GLUCOSE BLOOD) Strp, Use to check blood sugar two times a day., Disp: 100 each, Rfl: 0    blood-glucose meter kit, Use to check blood sugar two times a day. Use as instructed., Disp: 1 each, Rfl: 0    cetirizine  (ZYRTEC ) 10 MG tablet, Take 1 tablet (10 mg total) by mouth nightly., Disp: 90 tablet, Rfl: 3    dapsone  100 MG tablet, Take 1 tablet (100 mg total) by mouth daily. TAKE 1 TABLET(100 MG) BY MOUTH DAILY, Disp: 30 tablet, Rfl: 11    escitalopram  oxalate (LEXAPRO ) 10 MG tablet, Take 1 tablet (10 mg total) by mouth daily., Disp: 90 tablet, Rfl: 2    insulin  glargine (LANTUS  SOLOSTAR U-100 INSULIN ) 100 unit/mL (3 mL) injection pen, Inject 0.22 mL (22 Units total) under the skin nightly., Disp: , Rfl:     lancets Misc, Use to check blood sugar as directed 2 times a day & for symptoms of high or low blood sugar., Disp: 100 each, Rfl: 0    letermovir  (PREVYMIS ) 480 mg tablet, Take 1 tablet (480 mg total) by mouth daily., Disp: 28 tablet, Rfl: 5    losartan  (COZAAR ) 50 MG tablet, Take 1 tablet (50 mg total) by mouth in the morning., Disp: , Rfl:     PONATinib  (ICLUSIG ) 30 mg tablet, Take 1 tablet (30 mg total) by mouth daily. Swallow tablets whole. Do not crush, break, cut or chew tablets., Disp: 30 tablet, Rfl: 5    [Paused] rosuvastatin  (CRESTOR ) 10 MG tablet, Take 1 tablet (10 mg total) by mouth nightly. (Patient not taking: Reported on 01/29/2024), Disp: 90 tablet, Rfl: 3    valACYclovir  (VALTREX ) 500 MG tablet, Take 1 tablet (500 mg total) by mouth daily., Disp: 30 tablet, Rfl: 12  No current facility-administered medications for this visit.    Facility-Administered Medications Ordered in Other Visits: blinatumomab  (BLINCYTO ) 28 mcg/day in sodium chloride  (non-PVC) 0.9 % IVPB (72-hr OUTPATIENT CADD infusion), 28 mcg/day, Intravenous, over 72 hr, Estelle Toribio SAUNDERS, MD, 105 mcg at 01/29/24 0945    dexAMETHasone  (DECADRON ) 4 mg/mL injection 20 mg, 20 mg, Intravenous, Once PRN, Estelle Toribio SAUNDERS, MD    diphenhydrAMINE  (BENADRYL ) injection 25 mg, 25 mg, Intravenous, Once PRN, Estelle Toribio SAUNDERS, MD    EPINEPHrine [place]) injection 0.3 mg, 0.3 mg, Intramuscular, Once PRN, Estelle Toribio SAUNDERS, MD    famotidine  (PF) (PEPCID ) injection 20 mg, 20 mg, Intravenous, Once PRN, Estelle Toribio SAUNDERS, MD    heparin , porcine (PF) 100 unit/mL injection 500 Units, 500 Units, Intravenous, Q30 Min PRN, Estelle Toribio SAUNDERS, MD,  500 Units at 01/29/24 0945    methylPREDNISolone  sodium succinate  (SOLU-Medrol ) injection 125 mg, 125 mg, Intravenous, Once PRN, Estelle Toribio SAUNDERS, MD    OKAY TO SEND MEDICATION/CHEMOTHERAPY TO OUTPATIENT UNIT, , Other, Once, Estelle Toribio SAUNDERS, MD    prochlorperazine  (COMPAZINE ) injection 10 mg, 10 mg, Intravenous, Q6H PRN, Estelle Toribio SAUNDERS, MD    prochlorperazine  (COMPAZINE ) tablet 10 mg, 10 mg, Oral, Q6H PRN, Estelle Toribio SAUNDERS, MD    sodium chloride  0.9% (NS) bolus 1,000 mL, 1,000 mL, Intravenous, Once PRN, Estelle Toribio SAUNDERS, MD    Current antibiotics:  Letermovir    Valtrex   Dapsone  ppx    Previous antibiotics:  Levo, Cefepime   Flucon while neutropenic  Valcyte      Current/Prior immunomodulators:  Vincristine , pred    Other medications reviewed.       Review of Systems:  All other systems reviewed are negative.     Objective     Vital Signs:  There were no vitals taken for this visit.    Physical Exam:     Const [x]  vital signs above    [x]  NAD, non-toxic appearance []  Chronically ill-appearing, non-distressed        Eyes [x]  Lids normal bilaterally, conjunctiva anicteric and noninjected OU     [] PERRL  [] EOMI        ENMT [x]  Normal appearance of external nose and ears, no nasal discharge        []  MMM, no lesions on lips or gums []  No thrush, leukoplakia, oral lesions  []  Dentition good []  Edentulous []  Dental caries present  []  Hearing normal  []  TMs with good light reflexes bilaterally         Neck [x]  Neck of normal appearance and trachea midline        []  No thyromegaly, nodules, or tenderness   []  Full neck ROM        Lymph []  No LAD in neck     []  No LAD in supraclavicular area     []  No LAD in axillae   []  No LAD in epitrochlear chains     []  No LAD in inguinal areas        CV []  RRR            []  No peripheral edema     []  Pedal pulses intact   []  No abnormal heart sounds appreciated   []  Extremities WWP         Resp [x]  Normal WOB at rest    [x]  No breathlessness with speaking, no coughing  []  CTA anteriorly    []  CTA posteriorly          GI []  Normal inspection, NTND   []  NABS     []  No umbilical hernia on exam       []  No hepatosplenomegaly     []  Inspection of perineal and perianal areas normal        GU []  Normal external genitalia     [] No urinary catheter present in urethra   []  No CVA tenderness    []  No tenderness over renal allograft        MSK []  No clubbing or cyanosis of hands       []  No vertebral point tenderness  []  No focal tenderness or abnormalities on palpation of joints in RUE, LUE, RLE, or LLE        Skin [x]  No rashes, lesions, or ulcers of visualized skin     []   Skin warm and dry to palpation         Neuro [x]  Face expression symmetric  []  Sensation to light touch grossly intact throughout    []  Moves extremities equally    [x]  No tremor noted        []  CNs II-XII grossly intact     []  DTRs normal and symmetric throughout []  Gait unremarkable        Psych [x]  Appropriate affect       [x]  Fluent speech         [x]  Attentive, good eye contact  []  Oriented to person, place, time          [x]  Judgment and insight are appropriate             Labs:  Results in Past 30 Days  Result Component Current Result Ref Range Previous Result Ref Range   Absolute Eosinophils 0.1 (01/29/2024) 0.0 - 0.5 10*9/L 0.0 (01/27/2024) 0.0 - 0.5 10*9/L   Absolute Lymphocytes 1.1 (01/29/2024) 1.1 - 3.6 10*9/L 0.5 (L) (01/27/2024) 1.1 - 3.6 10*9/L   Absolute Neutrophils 2.5 (01/29/2024) 1.8 - 7.8 10*9/L 3.0 (01/27/2024) 1.8 - 7.8 10*9/L   Alkaline Phosphatase 64 (01/29/2024) 46 - 116 U/L 74 (01/25/2024) 46 - 116 U/L   ALT 37 (01/29/2024) 10 - 49 U/L 29 (01/25/2024) 10 - 49 U/L   AST 28 (01/29/2024) <=34 U/L 23 (01/25/2024) <=34 U/L   BUN 16 (01/29/2024) 9 - 23 mg/dL 15 (3/71/7974) 9 - 23 mg/dL   Calcium  9.3 (01/29/2024) 8.7 - 10.4 mg/dL 9.3 (3/71/7974) 8.7 - 89.5 mg/dL   Creatinine 9.46 (L) (01/29/2024) 0.55 - 1.02 mg/dL 9.36 (3/71/7974) 9.44 - 1.02 mg/dL   CRP <4.9 (3/73/7974) <=10.0 mg/L Not in Time Range    HGB 11.7 (01/29/2024) 11.3 - 14.9 g/dL 89.4 (L) (3/71/7974) 88.6 - 14.9 g/dL   Magnesium  1.8 (01/29/2024) 1.6 - 2.6 mg/dL 1.9 (3/73/7974) 1.6 - 2.6 mg/dL   Phosphorus 2.6 (3/69/7974) 2.4 - 5.1 mg/dL 3.0 (3/73/7974) 2.4 - 5.1 mg/dL   Platelet 871 (L) (3/69/7974) 150 - 450 10*9/L 129 (L) (01/27/2024) 150 - 450 10*9/L   Potassium 4.0 (01/29/2024) 3.4 - 4.8 mmol/L 3.7 (01/27/2024) 3.4 - 4.8 mmol/L   Total Bilirubin 0.4 (01/29/2024) 0.3 - 1.2 mg/dL 0.3 (3/73/7974) 0.3 - 1.2 mg/dL   WBC 4.0 (3/69/7974) 3.6 - 11.2 10*9/L 3.7 (01/27/2024) 3.6 - 11.2 10*9/L     Labs reviewed: ANC 2500, AST and ALT normal, creatinine normal 0.60 stable, ALC 1.1 lower than before    Microbiology:  CMV   05/13/22 318  05/18/22 ND  05/26/22 ND  06/02/22 670  06/06/22 223  06/14/22 156  06/22/22 230  06/27/22 492  07/04/22 294   12/5-12/26 Valcyte  900mg  PO BID  07/11/22 113  07/18/22 75  07/26/22 ND   12/26-1/24/24 Valcyte  450mg  PO BID  08/04/22 ND  08/08/22 <35  08/18/22 ND  08/23/22 ND  1//29/24 ND  09/05/22 59  09/12/22 408   09/14/22 Valcyte  900mg  PO BID  09/20/22 <35  09/26/22 <35  10/04/22 <35    10/10/22 147  3/19-->present ND    Imaging:  None new    Serologies:  Lab Results   Component Value Date    CMV IGG Positive (A) 01/24/2024    Hep B Surface Ag Nonreactive 11/25/2023    Hep B S Ab Reactive (A) 11/25/2023    Hep B Surf Ab Quant 276.43 (H) 11/25/2023    Hep B Core Total Ab Nonreactive 11/25/2023  Hepatitis C Ab Nonreactive 11/25/2023       Immunizations:  Immunization History   Administered Date(s) Administered    COVID-19 VACC,MRNA,(PFIZER)(PF) 02/20/2020, 03/12/2020    Covid-19 Vac, (4yr+) (Comirnaty) Mrna Pfizer  05/02/2023    TdaP 02/08/2017, 10/21/2021       Time spent on counseling/coordination of care: 15 Minutes  Total time spent with patient: 36 Minutes      The patient reports they are physically located in [place]  and is currently: at home. I conducted a audio/video visit. I spent  37 on the video call with the patient. I spent an additional 3 minutes on pre- and post-visit activities on the date of service .

## 2024-01-29 NOTE — Unmapped (Signed)
 Pt arrived to chair 18 in outpatient oncology infusion for cycle 2 day 5 of treatment. Pt accessed via Port, dbl lumen, Lateral lumen blina pulled from line, before forward flush,with positive blood return. Medial lumen, flushes easily, heparin  locked, & capped.     Labs drawn from port, sent to lab. Results do not warrant further orders.    Blina w/ new device set-up and infusing. Treatment completed, no concerns. Pt discharged ambulatory. AVS declined.

## 2024-01-29 NOTE — Unmapped (Signed)
 I spoke with patient Patricia [name] to confirm appointments on the following date(s):     Canceled LP on 7/8 and 7/31. Monthly LP moving forward.    [name]

## 2024-01-30 ENCOUNTER — Encounter
Admit: 2024-01-30 | Discharge: 2024-01-31 | Payer: PRIVATE HEALTH INSURANCE | Attending: Student in an Organized Health Care Education/Training Program | Primary: Student in an Organized Health Care Education/Training Program

## 2024-01-30 DIAGNOSIS — F411 Generalized anxiety disorder: Principal | ICD-10-CM

## 2024-01-30 DIAGNOSIS — C9100 Acute lymphoblastic leukemia not having achieved remission: Principal | ICD-10-CM

## 2024-01-30 DIAGNOSIS — F32A Depression, unspecified depression type: Principal | ICD-10-CM

## 2024-01-30 DIAGNOSIS — D801 Nonfamilial hypogammaglobulinemia: Principal | ICD-10-CM

## 2024-01-30 DIAGNOSIS — G620 Drug-induced polyneuropathy: Principal | ICD-10-CM

## 2024-01-30 DIAGNOSIS — F419 Anxiety disorder, unspecified: Principal | ICD-10-CM

## 2024-01-30 DIAGNOSIS — T451X5A Adverse effect of antineoplastic and immunosuppressive drugs, initial encounter: Principal | ICD-10-CM

## 2024-01-30 DIAGNOSIS — R519 Acute intractable headache, unspecified headache type: Principal | ICD-10-CM

## 2024-01-30 LAB — HI RES SBT ABCDR

## 2024-01-30 LAB — HLA CL I&II, HIGH RES

## 2024-01-30 NOTE — Unmapped (Signed)
 Addended by: POLLY ALYCE SQUIBB on: 01/30/2024 02:43 PM     Modules accepted: Orders

## 2024-01-30 NOTE — Unmapped (Signed)
 [name] Refill Coordination Note    Patricia [name], DOB: 07/01/1976  Phone: [phone] (home)       All above HIPAA information was verified with patient.         01/29/2024    12:46 PM   Specialty Rx Medication Refill Questionnaire   Which Medications would you like refilled and shipped? Iclusig    Please list all current allergies: Nothing new   Have you missed any doses in the last 30 days? Yes   If Yes, how many doses have you missed ? 0-2   Have you had any changes to your medication(s) since your last refill? No   How many days remaining of each medication do you have at home? 13   Have you experienced any side effects in the last 30 days? No   Please enter the full address (street address, city, state, zip code) where you would like your medication(s) to be delivered to. [address], [place] [address]   Please specify on which day you would like your medication(s) to arrive. Note: if you need your medication(s) within 3 days, please call the pharmacy to schedule your order at [phone]  02/06/2024   Has your insurance changed since your last refill? No   Would you like a pharmacist to call you to discuss your medication(s)? No   Do you require a signature for your package? (Note: if we are billing Medicare Part B or your order contains a controlled substance, we will require a signature) No   I have been provided my out of pocket cost for my medication and approve the pharmacy to charge the amount to my credit card on file. No   Additional Comments: I can???t remember if the coupon to make it free is permanently on my file now. I switched to my husband???s insurance as of June 1st.         Completed refill call assessment today to schedule patient's medication shipment from the [facility] [phone]).  All relevant notes have been reviewed.       Confirmed patient received a Conservation officer, historic buildings and a Surveyor, mining with first shipment. The patient will receive a drug information handout for each medication shipped and additional FDA Medication Guides as required.         REFERRAL TO PHARMACIST     Referral to the pharmacist: Not needed      [facility]     Shipping address confirmed in Epic.     Delivery Scheduled: Yes, Expected medication delivery date: 02/06/24.     Medication will be delivered via Next Day Courier to the prescription address in Epic [place].    Patricia [name]   [facility] Specialty Technician

## 2024-01-31 ENCOUNTER — Encounter
Admit: 2024-01-31 | Discharge: 2024-02-01 | Payer: PRIVATE HEALTH INSURANCE | Attending: Infectious Disease | Primary: Infectious Disease

## 2024-01-31 DIAGNOSIS — C9100 Acute lymphoblastic leukemia not having achieved remission: Principal | ICD-10-CM

## 2024-01-31 DIAGNOSIS — B259 Cytomegaloviral disease, unspecified: Principal | ICD-10-CM

## 2024-01-31 LAB — FSAB CLASS 1 ANTIBODY SPECIFICITY: HLA CLASS 1 ANTIBODY RESULT: NEGATIVE

## 2024-01-31 LAB — FSAB CLASS 2 ANTIBODY SPECIFICITY: HLA CL2 AB RESULT: NEGATIVE

## 2024-01-31 NOTE — Unmapped (Signed)
 Pt confirmed MyChart visit and meds

## 2024-02-01 ENCOUNTER — Inpatient Hospital Stay: Admit: 2024-02-01 | Discharge: 2024-02-01 | Payer: PRIVATE HEALTH INSURANCE

## 2024-02-01 DIAGNOSIS — C9100 Acute lymphoblastic leukemia not having achieved remission: Principal | ICD-10-CM

## 2024-02-01 DIAGNOSIS — Z7682 Awaiting organ transplant status: Principal | ICD-10-CM

## 2024-02-01 LAB — COMPREHENSIVE METABOLIC PANEL
ALBUMIN: 3.9 g/dL (ref 3.4–5.0)
ALKALINE PHOSPHATASE: 68 U/L (ref 46–116)
ALT (SGPT): 32 U/L (ref 10–49)
ANION GAP: 9 mmol/L (ref 5–14)
AST (SGOT): 23 U/L (ref <=34)
BILIRUBIN TOTAL: 0.5 mg/dL (ref 0.3–1.2)
BLOOD UREA NITROGEN: 22 mg/dL (ref 9–23)
BUN / CREAT RATIO: 39
CALCIUM: 9.6 mg/dL (ref 8.7–10.4)
CHLORIDE: 104 mmol/L (ref 98–107)
CO2: 26 mmol/L (ref 20.0–31.0)
CREATININE: 0.57 mg/dL (ref 0.55–1.02)
EGFR CKD-EPI (2021) FEMALE: >90 mL/min/1.73m2 (ref >=60)
GLUCOSE RANDOM: 261 mg/dL — ABNORMAL HIGH (ref 70–179)
POTASSIUM: 4.4 mmol/L (ref 3.4–4.8)
PROTEIN TOTAL: 7 g/dL (ref 5.7–8.2)
SODIUM: 139 mmol/L (ref 135–145)

## 2024-02-01 LAB — CBC W/ AUTO DIFF
BASOPHILS ABSOLUTE COUNT: 0 10*9/L (ref 0.0–0.1)
BASOPHILS RELATIVE PERCENT: 0.8 %
EOSINOPHILS ABSOLUTE COUNT: 0.3 10*9/L (ref 0.0–0.5)
EOSINOPHILS RELATIVE PERCENT: 5.5 %
HEMATOCRIT: 33 % — ABNORMAL LOW (ref 34.0–44.0)
HEMOGLOBIN: 11.4 g/dL (ref 11.3–14.9)
LYMPHOCYTES ABSOLUTE COUNT: 1.2 10*9/L (ref 1.1–3.6)
LYMPHOCYTES RELATIVE PERCENT: 25.3 %
MEAN CORPUSCULAR HEMOGLOBIN CONC: 34.6 g/dL (ref 32.0–36.0)
MEAN CORPUSCULAR HEMOGLOBIN: 34.6 pg — ABNORMAL HIGH (ref 25.9–32.4)
MEAN CORPUSCULAR VOLUME: 100 fL — ABNORMAL HIGH (ref 77.6–95.7)
MEAN PLATELET VOLUME: 9.4 fL (ref 6.8–10.7)
MONOCYTES ABSOLUTE COUNT: 0.5 10*9/L (ref 0.3–0.8)
MONOCYTES RELATIVE PERCENT: 10.9 %
NEUTROPHILS ABSOLUTE COUNT: 2.6 10*9/L (ref 1.8–7.8)
NEUTROPHILS RELATIVE PERCENT: 57.5 %
PLATELET COUNT: 112 10*9/L — ABNORMAL LOW (ref 150–450)
RED BLOOD CELL COUNT: 3.3 10*12/L — ABNORMAL LOW (ref 3.95–5.13)
RED CELL DISTRIBUTION WIDTH: 15.6 % — ABNORMAL HIGH (ref 12.2–15.2)
WBC ADJUSTED: 4.6 10*9/L (ref 3.6–11.2)

## 2024-02-01 LAB — PHOSPHORUS: PHOSPHORUS: 3.2 mg/dL (ref 2.4–5.1)

## 2024-02-01 LAB — MAGNESIUM: MAGNESIUM: 1.8 mg/dL (ref 1.6–2.6)

## 2024-02-01 MED ADMIN — blinatumomab (BLINCYTO) 28 mcg/day in sodium chloride (non-PVC) 0.9 % IVPB (7-day OUTPATIENT CADD infusion): 28 ug/d | INTRAVENOUS | @ 14:00:00 | Stop: 2024-02-01

## 2024-02-01 NOTE — Unmapped (Signed)
 RED ZONE Means: RED ZONE: Take action now!     You need to be seen right away  Symptoms are at a severe level of discomfort    Call 911 or go to your nearest  Hospital for help     - Bleeding that will not stop    - Hard to breathe    - New seizure - Chest pain  - Fall or passing out  -Thoughts of hurting    yourself or others      Call 911 if you are going into the RED ZONE                  YELLOW ZONE Means:     Please call with any new or worsening symptom(s), even if not on this list.  Call [phone]  After hours, weekends, and holidays - you will reach a long recording with specific instructions, If not in an emergency such as above, please listen closely all the way to the end and choose the option that relates to your need.   You can be seen by a provider the same day through our Same Day Acute Care for Patients with Cancer program.      YELLOW ZONE: Take action today     Symptoms are new or worsening  You are not within your goal range for:    - Pain    - Shortness of breath    - Bleeding (nose, urine, stool, wound)    - Feeling sick to your stomach and throwing up    - Mouth sores/pain in your mouth or throat    - Hard stool or very loose stools (increase in       ostomy output)    - No urine for 12 hours    - Feeding tube or other catheter/tube issue    - Redness or pain at previous IV or port/catheter site    - Depressed or anxiety   - Swelling (leg, arm, abdomen,     face, neck)  - Skin rash or skin changes  - Wound issues (redness, drainage,    re-opened)  - Confusion  - Vision changes  - Fever >100.4 F or chills  - Worsening cough with mucus that is    green, yellow, or bloody  - Pain or burning when going to the    bathroom  - Home Infusion Pump Issue- call    [phone]         Call your healthcare provider if you are going into the YELLOW ZONE     GREEN ZONE Means:  Your symptoms are under controls  Continue to take your medicine as ordered  Keep all visits to the provider GREEN ZONE: You are in control  No increase or worsening symptoms  Able to take your medicine  Able to drink and eat    - DO NOT use MyChart messages to report red or yellow symptoms. Allow up to 3    business days for a reply.  -MyChart is for non-urgent medication refills, scheduling requests, or other general questions.         [id number] Rev. 01/28/2022  Approved by Oncology Patient Education Committee     Hospital Outpatient Visit on 02/01/2024   Component Date Value Ref Range Status    Sodium 02/01/2024 139  135 - 145 mmol/L Final    Potassium 02/01/2024 4.4  3.4 - 4.8 mmol/L Final    Chloride 02/01/2024 104  98 - 107  mmol/L Final    CO2 02/01/2024 26.0  20.0 - 31.0 mmol/L Final    Anion Gap 02/01/2024 9  5 - 14 mmol/L Final    BUN 02/01/2024 22  9 - 23 mg/dL Final    Creatinine 92/96/7974 0.57  0.55 - 1.02 mg/dL Final    BUN/Creatinine Ratio 02/01/2024 39   Final    eGFR CKD-EPI (2021) Female 02/01/2024 >90  >=60 mL/min/1.22m2 Final    eGFR calculated with CKD-EPI 2021 equation in accordance with [employer] and [employer] of Nephrology Task Force recommendations.    Glucose 02/01/2024 261 (H)  70 - 179 mg/dL Final    Calcium  02/01/2024 9.6  8.7 - 10.4 mg/dL Final    Albumin 92/96/7974 3.9  3.4 - 5.0 g/dL Final    Total Protein 02/01/2024 7.0  5.7 - 8.2 g/dL Final    Total Bilirubin 02/01/2024 0.5  0.3 - 1.2 mg/dL Final    AST 92/96/7974 23  <=34 U/L Final    ALT 02/01/2024 32  10 - 49 U/L Final    Alkaline Phosphatase 02/01/2024 68  46 - 116 U/L Final    Magnesium  02/01/2024 1.8  1.6 - 2.6 mg/dL Final    Phosphorus 92/96/7974 3.2  2.4 - 5.1 mg/dL Final    WBC 92/96/7974 4.6  3.6 - 11.2 10*9/L Final    RBC 02/01/2024 3.30 (L)  3.95 - 5.13 10*12/L Final    HGB 02/01/2024 11.4  11.3 - 14.9 g/dL Final    HCT 92/96/7974 33.0 (L)  34.0 - 44.0 % Final    MCV 02/01/2024 100.0 (H)  77.6 - 95.7 fL Final    MCH 02/01/2024 34.6 (H)  25.9 - 32.4 pg Final    MCHC 02/01/2024 34.6  32.0 - 36.0 g/dL Final    RDW 92/96/7974 15.6 (H)  12.2 - 15.2 % Final    MPV 02/01/2024 9.4  6.8 - 10.7 fL Final    Platelet 02/01/2024 112 (L)  150 - 450 10*9/L Final    Neutrophils % 02/01/2024 57.5  % Final    Lymphocytes % 02/01/2024 25.3  % Final    Monocytes % 02/01/2024 10.9  % Final    Eosinophils % 02/01/2024 5.5  % Final    Basophils % 02/01/2024 0.8  % Final    Absolute Neutrophils 02/01/2024 2.6  1.8 - 7.8 10*9/L Final    Absolute Lymphocytes 02/01/2024 1.2  1.1 - 3.6 10*9/L Final    Absolute Monocytes 02/01/2024 0.5  0.3 - 0.8 10*9/L Final    Absolute Eosinophils 02/01/2024 0.3  0.0 - 0.5 10*9/L Final    Absolute Basophils 02/01/2024 0.0  0.0 - 0.1 10*9/L Final

## 2024-02-01 NOTE — Unmapped (Signed)
 Encounter addended by: Jarelly Rinck, RN on: 02/01/2024 11:41 AM   Actions taken: Clinical Note Signed

## 2024-02-01 NOTE — Unmapped (Signed)
 Patient arrived to chair 58.  No complaints noted.  Blina CADD infusion from lateral port discontinued.  No forward flush performed, + BR noted.  Labs drawn.  Both medial and lateral port deaccessed with + BR prior.  Per pt preference, only lateral port re accessed with + BR and dressed with CHG gel and mepore.    Pt hooked up to Blinatumomab  infusion via CADD pump on lateral port per orders.  At time of discharge, pump infusing.  Pt left clinic ambulatory.

## 2024-02-05 DIAGNOSIS — C9100 Acute lymphoblastic leukemia not having achieved remission: Principal | ICD-10-CM

## 2024-02-05 MED FILL — ICLUSIG 30 MG TABLET: ORAL | 30 days supply | Qty: 30 | Fill #3

## 2024-02-08 ENCOUNTER — Inpatient Hospital Stay: Admit: 2024-02-08 | Discharge: 2024-02-08 | Payer: PRIVATE HEALTH INSURANCE

## 2024-02-08 LAB — COMPREHENSIVE METABOLIC PANEL
ALBUMIN: 2.1 g/dL — ABNORMAL LOW (ref 3.4–5.0)
ALBUMIN: 3.7 g/dL (ref 3.4–5.0)
ALKALINE PHOSPHATASE: 42 U/L — ABNORMAL LOW (ref 46–116)
ALKALINE PHOSPHATASE: 72 U/L (ref 46–116)
ALT (SGPT): 23 U/L (ref 10–49)
ALT (SGPT): 41 U/L (ref 10–49)
ANION GAP: 11 mmol/L (ref 5–14)
ANION GAP: 9 mmol/L (ref 5–14)
AST (SGOT): 16 U/L (ref <=34)
AST (SGOT): 30 U/L (ref <=34)
BILIRUBIN TOTAL: 0.3 mg/dL (ref 0.3–1.2)
BILIRUBIN TOTAL: 0.4 mg/dL (ref 0.3–1.2)
BLOOD UREA NITROGEN: 12 mg/dL (ref 9–23)
BLOOD UREA NITROGEN: 21 mg/dL (ref 9–23)
BUN / CREAT RATIO: 35
BUN / CREAT RATIO: 36
CALCIUM: 5.3 mg/dL — CL (ref 8.7–10.4)
CALCIUM: 9.5 mg/dL (ref 8.7–10.4)
CHLORIDE: 121 mmol/L — ABNORMAL HIGH (ref 98–107)
CHLORIDE: 104 mmol/L (ref 98–107)
CO2: 17 mmol/L — ABNORMAL LOW (ref 20.0–31.0)
CO2: 27 mmol/L (ref 20.0–31.0)
CREATININE: 0.34 mg/dL — ABNORMAL LOW (ref 0.55–1.02)
CREATININE: 0.59 mg/dL (ref 0.55–1.02)
EGFR CKD-EPI (2021) FEMALE: >90 mL/min/1.73m2 (ref >=60)
EGFR CKD-EPI (2021) FEMALE: >90 mL/min/1.73m2 (ref >=60)
GLUCOSE RANDOM: 123 mg/dL (ref 70–179)
GLUCOSE RANDOM: 204 mg/dL — ABNORMAL HIGH (ref 70–179)
POTASSIUM: 2.2 mmol/L — CL (ref 3.4–4.8)
POTASSIUM: 4 mmol/L (ref 3.4–4.8)
PROTEIN TOTAL: 4 g/dL — ABNORMAL LOW (ref 5.7–8.2)
PROTEIN TOTAL: 6.7 g/dL (ref 5.7–8.2)
SODIUM: 149 mmol/L — ABNORMAL HIGH (ref 135–145)
SODIUM: 140 mmol/L (ref 135–145)

## 2024-02-08 LAB — CBC W/ AUTO DIFF
BASOPHILS ABSOLUTE COUNT: 0.1 10*9/L (ref 0.0–0.1)
BASOPHILS RELATIVE PERCENT: 1.2 %
EOSINOPHILS ABSOLUTE COUNT: 0.3 10*9/L (ref 0.0–0.5)
EOSINOPHILS RELATIVE PERCENT: 5.4 %
HEMATOCRIT: 34.6 % (ref 34.0–44.0)
HEMOGLOBIN: 12 g/dL (ref 11.3–14.9)
LYMPHOCYTES ABSOLUTE COUNT: 1.5 10*9/L (ref 1.1–3.6)
LYMPHOCYTES RELATIVE PERCENT: 23.7 %
MEAN CORPUSCULAR HEMOGLOBIN CONC: 34.7 g/dL (ref 32.0–36.0)
MEAN CORPUSCULAR HEMOGLOBIN: 33.7 pg — ABNORMAL HIGH (ref 25.9–32.4)
MEAN CORPUSCULAR VOLUME: 97.2 fL — ABNORMAL HIGH (ref 77.6–95.7)
MEAN PLATELET VOLUME: 9.6 fL (ref 6.8–10.7)
MONOCYTES ABSOLUTE COUNT: 0.7 10*9/L (ref 0.3–0.8)
MONOCYTES RELATIVE PERCENT: 12 %
NEUTROPHILS ABSOLUTE COUNT: 3.6 10*9/L (ref 1.8–7.8)
NEUTROPHILS RELATIVE PERCENT: 57.7 %
PLATELET COUNT: 145 10*9/L — ABNORMAL LOW (ref 150–450)
RED BLOOD CELL COUNT: 3.56 10*12/L — ABNORMAL LOW (ref 3.95–5.13)
RED CELL DISTRIBUTION WIDTH: 15.4 % — ABNORMAL HIGH (ref 12.2–15.2)
WBC ADJUSTED: 6.2 10*9/L (ref 3.6–11.2)

## 2024-02-08 LAB — PHOSPHORUS
PHOSPHORUS: 1.7 mg/dL — ABNORMAL LOW (ref 2.4–5.1)
PHOSPHORUS: 3.4 mg/dL (ref 2.4–5.1)

## 2024-02-08 LAB — MAGNESIUM
MAGNESIUM: 1 mg/dL — ABNORMAL LOW (ref 1.6–2.6)
MAGNESIUM: 1.9 mg/dL (ref 1.6–2.6)

## 2024-02-08 LAB — C-REACTIVE PROTEIN: C-REACTIVE PROTEIN: <5 mg/L (ref <=10.0)

## 2024-02-08 MED ADMIN — blinatumomab (BLINCYTO) 28 mcg/day in sodium chloride (non-PVC) 0.9 % IVPB (7-day OUTPATIENT CADD infusion): 28 ug/d | INTRAVENOUS | @ 15:00:00 | Stop: 2024-02-08

## 2024-02-08 MED FILL — PREVYMIS 480 MG TABLET: ORAL | 28 days supply | Qty: 28 | Fill #2

## 2024-02-08 NOTE — Unmapped (Signed)
 RED ZONE Means: RED ZONE: Take action now!     You need to be seen right away  Symptoms are at a severe level of discomfort    Call 911 or go to your nearest  Hospital for help     - Bleeding that will not stop    - Hard to breathe    - New seizure - Chest pain  - Fall or passing out  -Thoughts of hurting    yourself or others      Call 911 if you are going into the RED ZONE                  YELLOW ZONE Means:     Please call with any new or worsening symptom(s), even if not on this list.  Call [phone]  After hours, weekends, and holidays - you will reach a long recording with specific instructions, If not in an emergency such as above, please listen closely all the way to the end and choose the option that relates to your need.   You can be seen by a provider the same day through our Same Day Acute Care for Patients with Cancer program.      YELLOW ZONE: Take action today     Symptoms are new or worsening  You are not within your goal range for:    - Pain    - Shortness of breath    - Bleeding (nose, urine, stool, wound)    - Feeling sick to your stomach and throwing up    - Mouth sores/pain in your mouth or throat    - Hard stool or very loose stools (increase in       ostomy output)    - No urine for 12 hours    - Feeding tube or other catheter/tube issue    - Redness or pain at previous IV or port/catheter site    - Depressed or anxiety   - Swelling (leg, arm, abdomen,     face, neck)  - Skin rash or skin changes  - Wound issues (redness, drainage,    re-opened)  - Confusion  - Vision changes  - Fever >100.4 F or chills  - Worsening cough with mucus that is    green, yellow, or bloody  - Pain or burning when going to the    bathroom  - Home Infusion Pump Issue- call    [phone]         Call your healthcare provider if you are going into the YELLOW ZONE     GREEN ZONE Means:  Your symptoms are under controls  Continue to take your medicine as ordered  Keep all visits to the provider GREEN ZONE: You are in control  No increase or worsening symptoms  Able to take your medicine  Able to drink and eat    - DO NOT use MyChart messages to report red or yellow symptoms. Allow up to 3    business days for a reply.  -MyChart is for non-urgent medication refills, scheduling requests, or other general questions.         [id number] Rev. 01/28/2022  Approved by Oncology Patient Education Committee     Hospital Outpatient Visit on 02/08/2024   Component Date Value Ref Range Status    Sodium 02/08/2024 149 (H)  135 - 145 mmol/L Final    Potassium 02/08/2024 2.2 (LL)  3.4 - 4.8 mmol/L Final    Chloride 02/08/2024 121 (H)  98 - 107 mmol/L Final    CO2 02/08/2024 17.0 (L)  20.0 - 31.0 mmol/L Final    Anion Gap 02/08/2024 11  5 - 14 mmol/L Final    BUN 02/08/2024 12  9 - 23 mg/dL Final    Creatinine 92/89/7974 0.34 (L)  0.55 - 1.02 mg/dL Final    BUN/Creatinine Ratio 02/08/2024 35   Final    eGFR CKD-EPI (2021) Female 02/08/2024 >90  >=60 mL/min/1.32m2 Final    eGFR calculated with CKD-EPI 2021 equation in accordance with [employer] and [employer] of Nephrology Task Force recommendations.    Glucose 02/08/2024 123  70 - 179 mg/dL Final    Calcium  02/08/2024 5.3 (LL)  8.7 - 10.4 mg/dL Final    Albumin 92/89/7974 2.1 (L)  3.4 - 5.0 g/dL Final    Total Protein 02/08/2024 4.0 (L)  5.7 - 8.2 g/dL Final    Total Bilirubin 02/08/2024 0.3  0.3 - 1.2 mg/dL Final    AST 92/89/7974 16  <=34 U/L Final    ALT 02/08/2024 23  10 - 49 U/L Final    Alkaline Phosphatase 02/08/2024 42 (L)  46 - 116 U/L Final    Magnesium  02/08/2024 1.0 (L)  1.6 - 2.6 mg/dL Final    Phosphorus 92/89/7974 1.7 (L)  2.4 - 5.1 mg/dL Final    CRP 92/89/7974 <5.0  <=10.0 mg/L Final    WBC 02/08/2024 6.2  3.6 - 11.2 10*9/L Final    RBC 02/08/2024 3.56 (L)  3.95 - 5.13 10*12/L Final    HGB 02/08/2024 12.0  11.3 - 14.9 g/dL Final    HCT 92/89/7974 34.6  34.0 - 44.0 % Final    MCV 02/08/2024 97.2 (H)  77.6 - 95.7 fL Final    MCH 02/08/2024 33.7 (H)  25.9 - 32.4 pg Final    MCHC 02/08/2024 34.7  32.0 - 36.0 g/dL Final    RDW 92/89/7974 15.4 (H)  12.2 - 15.2 % Final    MPV 02/08/2024 9.6  6.8 - 10.7 fL Final    Platelet 02/08/2024 145 (L)  150 - 450 10*9/L Final    Neutrophils % 02/08/2024 57.7  % Final    Lymphocytes % 02/08/2024 23.7  % Final    Monocytes % 02/08/2024 12.0  % Final    Eosinophils % 02/08/2024 5.4  % Final    Basophils % 02/08/2024 1.2  % Final    Absolute Neutrophils 02/08/2024 3.6  1.8 - 7.8 10*9/L Final    Absolute Lymphocytes 02/08/2024 1.5  1.1 - 3.6 10*9/L Final    Absolute Monocytes 02/08/2024 0.7  0.3 - 0.8 10*9/L Final    Absolute Eosinophils 02/08/2024 0.3  0.0 - 0.5 10*9/L Final    Absolute Basophils 02/08/2024 0.1  0.0 - 0.1 10*9/L Final    Sodium 02/08/2024 140  135 - 145 mmol/L Final    Potassium 02/08/2024 4.0  3.4 - 4.8 mmol/L Final    Chloride 02/08/2024 104  98 - 107 mmol/L Final    CO2 02/08/2024 27.0  20.0 - 31.0 mmol/L Final    Anion Gap 02/08/2024 9  5 - 14 mmol/L Final    BUN 02/08/2024 21  9 - 23 mg/dL Final    Creatinine 92/89/7974 0.59  0.55 - 1.02 mg/dL Final    BUN/Creatinine Ratio 02/08/2024 36   Final    eGFR CKD-EPI (2021) Female 02/08/2024 >90  >=60 mL/min/1.9m2 Final    eGFR calculated with CKD-EPI 2021 equation in accordance with [employer] and [employer] of Nephrology Task Force recommendations.    Glucose 02/08/2024 204 (H)  70 - 179 mg/dL Final    Calcium  02/08/2024 9.5  8.7 - 10.4 mg/dL Final    Albumin 92/89/7974 3.7  3.4 - 5.0 g/dL Final    Total Protein 02/08/2024 6.7  5.7 - 8.2 g/dL Final    Total Bilirubin 02/08/2024 0.4  0.3 - 1.2 mg/dL Final    AST 92/89/7974 30  <=34 U/L Final    ALT 02/08/2024 41  10 - 49 U/L Final    Alkaline Phosphatase 02/08/2024 72  46 - 116 U/L Final    Phosphorus 02/08/2024 3.4  2.4 - 5.1 mg/dL Final    Magnesium  02/08/2024 1.9  1.6 - 2.6 mg/dL Final

## 2024-02-08 NOTE — Unmapped (Signed)
 Pt presents to the clinic, Chair 60 for the following???    Chemo: BLINATUMOMAB      Chemo day: C2D15    Data: vital signs stable, review of systems within parameters, labs reviewed within parameters, or adverse drug reactions reviewed. IV access ight chest wall port-a-cath.    Action: BSA/agent double checked with another RN, neuro check by MD noted or positive blood return during administration.    Response: tolerated without complaints, positive blood return, or no redness or edema at Right chest-wall port-a-cath    Right chest-wall port-a-cath remained access for BLINATUMOMAB  CADD pump home infusion.  Site with CHG dressing present, CDI and free from s/sx of infection.  AVS was decline.  Pt was discharged in stable condition.  She walked off the unit  to the front of the cancer center where her ride awaits to take her home.

## 2024-02-08 NOTE — Unmapped (Signed)
 ICANS assessment completed. Neurologically intact. Clear to proceed with blinatumomab .    Alyce Shaggy, AGPCNP-BC  Nurse Practitioner  Hematology/Oncology  [facility]

## 2024-02-09 ENCOUNTER — Encounter
Admit: 2024-02-09 | Discharge: 2024-02-09 | Payer: PRIVATE HEALTH INSURANCE | Attending: Internal Medicine | Primary: Internal Medicine

## 2024-02-12 ENCOUNTER — Encounter
Admit: 2024-02-12 | Discharge: 2024-02-12 | Payer: PRIVATE HEALTH INSURANCE | Attending: Internal Medicine | Primary: Internal Medicine

## 2024-02-12 DIAGNOSIS — Z005 Encounter for examination of potential donor of organ and tissue: Principal | ICD-10-CM

## 2024-02-14 ENCOUNTER — Encounter
Admit: 2024-02-14 | Discharge: 2024-02-14 | Payer: PRIVATE HEALTH INSURANCE | Attending: Internal Medicine | Primary: Internal Medicine

## 2024-02-15 ENCOUNTER — Inpatient Hospital Stay: Admit: 2024-02-15 | Discharge: 2024-02-15 | Payer: PRIVATE HEALTH INSURANCE

## 2024-02-15 LAB — CBC W/ AUTO DIFF
BASOPHILS ABSOLUTE COUNT: 0 10*9/L (ref 0.0–0.1)
BASOPHILS RELATIVE PERCENT: 1 %
EOSINOPHILS ABSOLUTE COUNT: 0.3 10*9/L (ref 0.0–0.5)
EOSINOPHILS RELATIVE PERCENT: 6.5 %
HEMATOCRIT: 33.4 % — ABNORMAL LOW (ref 34.0–44.0)
HEMOGLOBIN: 11.7 g/dL (ref 11.3–14.9)
LYMPHOCYTES ABSOLUTE COUNT: 1.1 10*9/L (ref 1.1–3.6)
LYMPHOCYTES RELATIVE PERCENT: 26 %
MEAN CORPUSCULAR HEMOGLOBIN CONC: 35.1 g/dL (ref 32.0–36.0)
MEAN CORPUSCULAR HEMOGLOBIN: 33.1 pg — ABNORMAL HIGH (ref 25.9–32.4)
MEAN CORPUSCULAR VOLUME: 94.3 fL (ref 77.6–95.7)
MEAN PLATELET VOLUME: 10.4 fL (ref 6.8–10.7)
MONOCYTES ABSOLUTE COUNT: 0.4 10*9/L (ref 0.3–0.8)
MONOCYTES RELATIVE PERCENT: 9.4 %
NEUTROPHILS ABSOLUTE COUNT: 2.3 10*9/L (ref 1.8–7.8)
NEUTROPHILS RELATIVE PERCENT: 57.1 %
PLATELET COUNT: 122 10*9/L — ABNORMAL LOW (ref 150–450)
RED BLOOD CELL COUNT: 3.54 10*12/L — ABNORMAL LOW (ref 3.95–5.13)
RED CELL DISTRIBUTION WIDTH: 14.9 % (ref 12.2–15.2)
WBC ADJUSTED: 4.1 10*9/L (ref 3.6–11.2)

## 2024-02-15 LAB — COMPREHENSIVE METABOLIC PANEL
ALBUMIN: 3.8 g/dL (ref 3.4–5.0)
ALKALINE PHOSPHATASE: 68 U/L (ref 46–116)
ALT (SGPT): 30 U/L (ref 10–49)
ANION GAP: 9 mmol/L (ref 5–14)
AST (SGOT): 20 U/L (ref <=34)
BILIRUBIN TOTAL: 0.5 mg/dL (ref 0.3–1.2)
BLOOD UREA NITROGEN: 21 mg/dL (ref 9–23)
BUN / CREAT RATIO: 37
CALCIUM: 9.5 mg/dL (ref 8.7–10.4)
CHLORIDE: 106 mmol/L (ref 98–107)
CO2: 27 mmol/L (ref 20.0–31.0)
CREATININE: 0.57 mg/dL (ref 0.55–1.02)
EGFR CKD-EPI (2021) FEMALE: >90 mL/min/1.73m2 (ref >=60)
GLUCOSE RANDOM: 243 mg/dL — ABNORMAL HIGH (ref 70–179)
POTASSIUM: 4.2 mmol/L (ref 3.4–4.8)
PROTEIN TOTAL: 6.7 g/dL (ref 5.7–8.2)
SODIUM: 142 mmol/L (ref 135–145)

## 2024-02-15 LAB — MAGNESIUM: MAGNESIUM: 1.7 mg/dL (ref 1.6–2.6)

## 2024-02-15 LAB — PHOSPHORUS: PHOSPHORUS: 3.6 mg/dL (ref 2.4–5.1)

## 2024-02-15 MED ADMIN — blinatumomab (BLINCYTO) 28 mcg/day in sodium chloride (non-PVC) 0.9 % IVPB (7-day OUTPATIENT CADD infusion): 28 ug/d | INTRAVENOUS | @ 16:00:00 | Stop: 2024-02-15

## 2024-02-15 NOTE — Unmapped (Signed)
 Pt arrived to chair ambulatory in outpatient oncology infusion blina change per orders. Pt accessed via PORT, with positive blood return. Treatment completed, no concerns. Pt discharged ambulatory. AVS declined.

## 2024-02-15 NOTE — Unmapped (Signed)
 RED ZONE Means: RED ZONE: Take action now!     You need to be seen right away  Symptoms are at a severe level of discomfort    Call 911 or go to your nearest  Hospital for help     - Bleeding that will not stop    - Hard to breathe    - New seizure - Chest pain  - Fall or passing out  -Thoughts of hurting    yourself or others      Call 911 if you are going into the RED ZONE                  YELLOW ZONE Means:     Please call with any new or worsening symptom(s), even if not on this list.  Call [phone]  After hours, weekends, and holidays - you will reach a long recording with specific instructions, If not in an emergency such as above, please listen closely all the way to the end and choose the option that relates to your need.   You can be seen by a provider the same day through our Same Day Acute Care for Patients with Cancer program.      YELLOW ZONE: Take action today     Symptoms are new or worsening  You are not within your goal range for:    - Pain    - Shortness of breath    - Bleeding (nose, urine, stool, wound)    - Feeling sick to your stomach and throwing up    - Mouth sores/pain in your mouth or throat    - Hard stool or very loose stools (increase in       ostomy output)    - No urine for 12 hours    - Feeding tube or other catheter/tube issue    - Redness or pain at previous IV or port/catheter site    - Depressed or anxiety   - Swelling (leg, arm, abdomen,     face, neck)  - Skin rash or skin changes  - Wound issues (redness, drainage,    re-opened)  - Confusion  - Vision changes  - Fever >100.4 F or chills  - Worsening cough with mucus that is    green, yellow, or bloody  - Pain or burning when going to the    bathroom  - Home Infusion Pump Issue- call    [phone]         Call your healthcare provider if you are going into the YELLOW ZONE     GREEN ZONE Means:  Your symptoms are under controls  Continue to take your medicine as ordered  Keep all visits to the provider GREEN ZONE: You are in control  No increase or worsening symptoms  Able to take your medicine  Able to drink and eat    - DO NOT use MyChart messages to report red or yellow symptoms. Allow up to 3    business days for a reply.  -MyChart is for non-urgent medication refills, scheduling requests, or other general questions.         [id number] Rev. 01/28/2022  Approved by Oncology Patient Education Committee     Hospital Outpatient Visit on 02/15/2024   Component Date Value Ref Range Status    Sodium 02/15/2024 142  135 - 145 mmol/L Final    Potassium 02/15/2024 4.2  3.4 - 4.8 mmol/L Final    Chloride 02/15/2024 106  98 - 107  mmol/L Final    CO2 02/15/2024 27.0  20.0 - 31.0 mmol/L Final    Anion Gap 02/15/2024 9  5 - 14 mmol/L Final    BUN 02/15/2024 21  9 - 23 mg/dL Final    Creatinine 92/82/7974 0.57  0.55 - 1.02 mg/dL Final    BUN/Creatinine Ratio 02/15/2024 37   Final    eGFR CKD-EPI (2021) Female 02/15/2024 >90  >=60 mL/min/1.70m2 Final    eGFR calculated with CKD-EPI 2021 equation in accordance with [employer] and [employer] of Nephrology Task Force recommendations.    Glucose 02/15/2024 243 (H)  70 - 179 mg/dL Final    Calcium  02/15/2024 9.5  8.7 - 10.4 mg/dL Final    Albumin 92/82/7974 3.8  3.4 - 5.0 g/dL Final    Total Protein 02/15/2024 6.7  5.7 - 8.2 g/dL Final    Total Bilirubin 02/15/2024 0.5  0.3 - 1.2 mg/dL Final    AST 92/82/7974 20  <=34 U/L Final    ALT 02/15/2024 30  10 - 49 U/L Final    Alkaline Phosphatase 02/15/2024 68  46 - 116 U/L Final    Magnesium  02/15/2024 1.7  1.6 - 2.6 mg/dL Final    Phosphorus 92/82/7974 3.6  2.4 - 5.1 mg/dL Final    WBC 92/82/7974 4.1  3.6 - 11.2 10*9/L Final    RBC 02/15/2024 3.54 (L)  3.95 - 5.13 10*12/L Final    HGB 02/15/2024 11.7  11.3 - 14.9 g/dL Final    HCT 92/82/7974 33.4 (L)  34.0 - 44.0 % Final    MCV 02/15/2024 94.3  77.6 - 95.7 fL Final    MCH 02/15/2024 33.1 (H)  25.9 - 32.4 pg Final    MCHC 02/15/2024 35.1  32.0 - 36.0 g/dL Final    RDW 92/82/7974 14.9 12.2 - 15.2 % Final    MPV 02/15/2024 10.4  6.8 - 10.7 fL Final    Platelet 02/15/2024 122 (L)  150 - 450 10*9/L Final    Neutrophils % 02/15/2024 57.1  % Final    Lymphocytes % 02/15/2024 26.0  % Final    Monocytes % 02/15/2024 9.4  % Final    Eosinophils % 02/15/2024 6.5  % Final    Basophils % 02/15/2024 1.0  % Final    Absolute Neutrophils 02/15/2024 2.3  1.8 - 7.8 10*9/L Final    Absolute Lymphocytes 02/15/2024 1.1  1.1 - 3.6 10*9/L Final    Absolute Monocytes 02/15/2024 0.4  0.3 - 0.8 10*9/L Final    Absolute Eosinophils 02/15/2024 0.3  0.0 - 0.5 10*9/L Final    Absolute Basophils 02/15/2024 0.0  0.0 - 0.1 10*9/L Final

## 2024-02-16 ENCOUNTER — Encounter: Admit: 2024-02-16 | Discharge: 2024-02-16 | Payer: PRIVATE HEALTH INSURANCE

## 2024-02-16 ENCOUNTER — Inpatient Hospital Stay: Admit: 2024-02-16 | Discharge: 2024-02-16 | Payer: PRIVATE HEALTH INSURANCE

## 2024-02-16 ENCOUNTER — Ambulatory Visit: Admit: 2024-02-16 | Discharge: 2024-02-16 | Payer: PRIVATE HEALTH INSURANCE

## 2024-02-16 ENCOUNTER — Other Ambulatory Visit: Admit: 2024-02-16 | Discharge: 2024-02-16 | Payer: PRIVATE HEALTH INSURANCE

## 2024-02-16 DIAGNOSIS — Z005 Encounter for examination of potential donor of organ and tissue: Principal | ICD-10-CM

## 2024-02-16 DIAGNOSIS — Z7682 Awaiting organ transplant status: Principal | ICD-10-CM

## 2024-02-16 DIAGNOSIS — C9100 Acute lymphoblastic leukemia not having achieved remission: Principal | ICD-10-CM

## 2024-02-16 LAB — URINALYSIS WITH MICROSCOPY WITH CULTURE REFLEX PERFORMABLE
BACTERIA: NONE SEEN /HPF
BILIRUBIN UA: NEGATIVE
BLOOD UA: NEGATIVE
GLUCOSE UA: NEGATIVE
KETONES UA: NEGATIVE
LEUKOCYTE ESTERASE UA: NEGATIVE
NITRITE UA: NEGATIVE
PH UA: 6.5 (ref 5.0–9.0)
PROTEIN UA: NEGATIVE
RBC UA: <1 /HPF (ref <=4)
SPECIFIC GRAVITY UA: 1.005 (ref 1.003–1.030)
SQUAMOUS EPITHELIAL: <1 /HPF (ref 0–5)
UROBILINOGEN UA: <2
WBC UA: <1 /HPF (ref 0–5)

## 2024-02-16 LAB — APTT
APTT: 30 s (ref 24.8–38.4)
HEPARIN CORRELATION: 0.2

## 2024-02-16 LAB — CBC W/ AUTO DIFF
BASOPHILS ABSOLUTE COUNT: 0 10*9/L (ref 0.0–0.1)
BASOPHILS RELATIVE PERCENT: 0.9 %
EOSINOPHILS ABSOLUTE COUNT: 0.3 10*9/L (ref 0.0–0.5)
EOSINOPHILS RELATIVE PERCENT: 6.5 %
HEMATOCRIT: 35.2 % (ref 34.0–44.0)
HEMOGLOBIN: 12 g/dL (ref 11.3–14.9)
LYMPHOCYTES ABSOLUTE COUNT: 1.3 10*9/L (ref 1.1–3.6)
LYMPHOCYTES RELATIVE PERCENT: 25.1 %
MEAN CORPUSCULAR HEMOGLOBIN CONC: 34.2 g/dL (ref 32.0–36.0)
MEAN CORPUSCULAR HEMOGLOBIN: 32.3 pg (ref 25.9–32.4)
MEAN CORPUSCULAR VOLUME: 94.7 fL (ref 77.6–95.7)
MEAN PLATELET VOLUME: 10.6 fL (ref 6.8–10.7)
MONOCYTES ABSOLUTE COUNT: 0.5 10*9/L (ref 0.3–0.8)
MONOCYTES RELATIVE PERCENT: 9.8 %
NEUTROPHILS ABSOLUTE COUNT: 3.1 10*9/L (ref 1.8–7.8)
NEUTROPHILS RELATIVE PERCENT: 57.7 %
PLATELET COUNT: 131 10*9/L — ABNORMAL LOW (ref 150–450)
RED BLOOD CELL COUNT: 3.72 10*12/L — ABNORMAL LOW (ref 3.95–5.13)
RED CELL DISTRIBUTION WIDTH: 14.7 % (ref 12.2–15.2)
WBC ADJUSTED: 5.3 10*9/L (ref 3.6–11.2)

## 2024-02-16 LAB — LACTATE DEHYDROGENASE: LACTATE DEHYDROGENASE: 366 U/L — ABNORMAL HIGH (ref 120–246)

## 2024-02-16 LAB — URIC ACID: URIC ACID: 2.8 mg/dL — ABNORMAL LOW (ref 3.1–7.8)

## 2024-02-16 LAB — PROTIME-INR
INR: 1.04
PROTIME: 11.9 s (ref 9.9–12.6)

## 2024-02-16 NOTE — Unmapped (Addendum)
 SOCIAL WORK  BONE MARROW TRANSPLANT ASSESSMENT       DATE OF EVALUATION:  February 16, 2024     INFORMANTS: Patricia [name], patient; Devaughn Ranger, patient's husband; Review of [facility] medical records.     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  Given her deep molecular remission and her intolerance of hyperCVAD, we are transitioned to maintenance chemotherapy.     She is currently being considered for an allogeneic hematopoietic cell transplantation. She presents today with her husband Devaughn, for completion of a psychosocial assessment.     FUNCTIONAL STATUS: Patricia [name] is independent with most of her activities of daily living and mobility. She does require some assistance with care of her chemotherapy treatment bag on occasion, which her husband is currently able to provide. She is able to walk about 10 minutes, prior to taking a quick break to rest. She has a shower chair at home that she uses on occasion. She denies the use of any other medical equipment at home. She is able to drive. She seems well able to make her own decisions, currently.    UNDERSTANDING OF MEDICAL CONDITION AND TRANSPLANT:  Patricia [name] has a good understanding of her medical condition and some understanding of the proposed transplant. This Child psychotherapist reviewed the logistical aspects of transplant including hospitalization, local area lodging, post-transplant recovery with clinic follow-up and caregiver expectations of transplant with her. She is the process of reading through the materials provided to her by her medical team to increase her knowledge and understanding. Patricia [name] and her husband Devaughn seem to be sufficiently informed about her medical condition and treatment options.  They are in agreement to receive ongoing education about the transplant process by the transplant team.          ATTITUDE ABOUT TRANSPLANT:   Patricia [name] states that the proposed transplant sounds scary, but despite that she feels positive about pursuing as a treatment.     LIVING SITUATION AND SUPPORTS: Patricia [name] lives with her husband Devaughn and 2 dogs at [address] in [place], [place] . This 1 story; 3 bedroom/2 bathroom home is located in [place]. There is 1 step to enter/exit the home.    Her most significant supports include her husband Devaughn, her parents, 2 close friends, her in laws, and her sister. She also describes good supports with her husband's spiritual community.    The social worker reviewed the pet policy as it pertains to transplant. Patricia [name] reports her husband will be providing care for their dogs, during her treatment and recovery period.    WHO WILL BE PATIENT'S CAREGIVER FOLLOWING TRANSPLANT: Patricia [name] provided the following caregiver information:    Primary Caregiver on the nights and weekends: Devaughn Ranger, patient's husband [phone]) The social worker was able to speak to Mr. Ranger during the assessment today. He reports his willingness and availability to be present in [facility] with patient for pre-transplant work-up, procedures, stem cell collection, and post-transplant recovery. He is aware of caregiver responsibilities and in agreement to do whatever is necessary to support Patricia [name]. He reports he will continue to work full  time during patient's recovery period, so while he is at work her other caregivers will be available to be with her until he gets off. He denies any barriers with the role.    Back Up Caregiver #1 Thersia Cook, friend [phone]) The social worker spoke to Ms. Colarusso via phone today. She reports his willingness and availability to be present in [facility] with patient for pre-transplant work-up, procedures, stem cell collection, and post-transplant recovery. She is aware of caregiver responsibilities and in agreement to do whatever is necessary to support Patricia [name]. She denies any barriers with undergoing the role. She reports she works remotely and is able to work from any where.    Back Up Caregiver #2 Bernarda Sons, friend [phone]) The social worker spoke Ms. [name] via phone today. She reports his willingness and availability to be present in [facility] with patient for pre-transplant work-up, procedures, stem cell collection, and post-transplant recovery. She is aware of caregiver responsibilities and in agreement to do whatever is necessary to support Patricia [name]. She denies any barriers with undergoing the role. Patricia [name] reports she has kids, but they would be staying with her husband when she comes to spend time with patient during her recovery period.    Back Up Caregiver #3 Patricia [name], patient's mother [phone]) The social worker was able to speak to Patricia [name] via phone today. She reports his willingness and availability to be present in [facility] with patient for pre-transplant work-up, procedures, stem cell collection, and post-transplant recovery. She is aware of caregiver responsibilities and in agreement to do whatever is necessary to support Patricia [name]. She reports she lives in [place] , but she plans to travel to [place]  when needed. She reports she is retired and has flexibility.          WHERE WILL PATIENT BE STAYING FOLLOWING TRANSPLANT: Patricia [name] lives 50 minutes/33 miles away from [facility]. This is too far away for her to return to, following her hospital discharge. Her medical team recommends that she stay within 30-45 minutes of the [facility] for >70 days, post transplant discharge. The social worker reviewed local lodging options with her. Mrs. Vadala states she will either stay at [facility] in [place] or with one of her friends who lives closer to [facility] for her recovery period, if needed.    COMPLIANCE HISTORY:   Per medical record, Mrs. Hamid has missed 2 out of 270 appointments to [facility]. She reports these missed appointment were due to some Emergency Room visits. She understands the importance of attending all of her work up and recovery appointments for work up and recovery for her transplant. She is in agreement with attending these appointments.    SUBSTANCE HISTORY: Mrs. Lassalle reports she is a former cigarette smoker, who smoked for about 20 years (the amount of which varied from day to day, but at most she smoked about a pack a day). She quit cigarettes about 7 years ago. She reports she vaped for a little while as well, prior to quitting that about 7 years ago as well. She reports she used cocaine, mushrooms and Ecstasy a few times in her life, but has not used these substances since her early 78's. She reports she last used marijuana about 3 years ago.    She reports she has accidentally taken more of an over the counter medication (Excedrin) than was allowable, which led to a hospital/ER visit. However, once  she became aware of the error, she stopped using this medication the way she had been.    Mrs. Prunty states she is not currently drinking any alcohol, but she was drinking socially prior to her diagnosis.    The social worker reviewed the potentially negative impact of any substance use on a transplant. Mrs. Commerford agrees to abstain from any substance use during the work up and recovery period. She does not feel this will be a hardship in any way.    PSYCHIATRIC HISTORY:  Mrs. Icenhour reports she has been diagnosed with depression/anxiety in the past. She reports she also feels she has Attention Deficit Hyperactivity Disorder (ADHD), although she has never been officially diagnosed with this condition She reports she started working with a Veterinary surgeon in 2020, with [employer]; following the passing of her brother.     She reports that while she is not currently actively working with this counselor, she is able to call and schedule a virtual appointment with him whenever she feels she needs it. In addition to the counseling, she also takes Lexapro  (daily) which is prescribed to her currently by her [facility] Oncology provider. She feels that with her medication and counseling supports, these conditions are currently well managed. She denies any past or present suicidal or homicidal ideations. Her PHQ 9 score (3/27) and GAD 7 score (6/21) are indicative of minimal depression and mild anxiety symptoms in the past two weeks. She attributes these scores to feeling little interest in doing things, feeling more tired, and having some appetite issues; several days in the past two weeks. She also indicates she has felt more easily annoyed; more than half of the days in the past two weeks. Lastly she has felt nervous, had sleeping issues due to worry, worrying about several different things, having trouble relaxing, and felt a little more restless; several days during that time.    The social worker provided education on support services available through the Comprehensive Cancer Support Program (CCSP). She denied wanting a referral to CCSP at this time. Mrs. Stare agreed to notify a member of the medical team if psychiatric/supportive services are needed. She reports her current plan is for her to reach out to her local counselor to set up an appointment to help her address some of the hardships she has recently been managing.    PHQ-9 Score: 3    Screening complete, no depression identified / no further action needed today     COPING STYLE: Mrs. Kavan copes with her illness by gardening, crafting, spending time with her friends, and spending time with her dogs. She is receptive to recreational services, while hospitalized.     ADVANCE DIRECTIVES:  Mrs. Midkiff is working on completing her [employer] (HCPOA). She has agreed to submit these forms to her medical team, prior to undergoing the transplant.     EDUCATION AND WORK HISTORY:   Mrs. Klinke has her masters degree. 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 She reports she was on short term leave, but this leave ended in May 2025. She reports she is in the process of getting that leave transitioned to long term leave. She reports she has not worked since March 2023.    Her husband Devaughn works in the [employer] and plans to continue to work during his wife's treatment and recovery period. He plans to be her caregiver at night when he gets off from work.  FINANCIAL RESOURCES:  Mrs. Clary reports they currently have access to income through her husband's employment.  She reports they have also recently applied for [employer] program and they are working on submitting all the necessary paperwork for this program.    They report they are able to pay their monthly bills, currently, but finances are strained at times.    The social worker reviewed general information on financial assistance programs and encouraged Mrs. Helgeson to reach out to her if needs/further questions arise.     MEDICAL COVERAGE:   Mrs. Castner currently has the [facility] with [employer] through her husband's employment. She expects this coverage to continue throughout her work up and recovery period. She does not have access to travel/lodging benefits through this coverage, because she lives less than 50 miles away from [facility].    She reports she has also received a letter from the [facility] office stating she will start receiving Medicare part B coverage in September 2025. She reports she is a little confused about this however, because currently she does not have Medicare. Social worker attempted to obtain more information regarding her insurance at this time.     Mrs. Ciocca reports she does not currently have this letter with her to show Child psychotherapist, but when she is able to look at it, she will call social worker to review more details about this.     ACCESS TO TRANSPORTATION:   Mrs. Weekes has a car and no issues were identified in this area.       RELIGIOUS AFFILIATION:   Mrs. Bolanos states she is a part of a spiritual community and her faith is important to her. She is receptive to pastoral services, during her hospitalization.     HOME HEALTH AGENCY:  Mrs. Chastang  has not worked with home health services. She is willing to work with the transplant case manager to arrange any needed post-acute services.      PLAN FOR TRANSPLANTATION:  Mrs. Lansdale is preparing for transplant. She is nervous, but she feels hopeful.     ASSESSMENT:  Mrs. Juneau is a pleasant 48 year old female who appears to have strong family/friend support and external resources to support her through the allogenic stem cell transplant process. She verbalized readiness to move forward and agreed to work with the Child psychotherapist to access additional resources as needed.         EDUCATION/PLAN:  The transplant social worker discussed the transplant process, reviewed related resources, including support services and housing options in the [place] area. The transplant social worker will be available as needed throughout the evaluation process and beyond, if MAURA BRAATEN is approved for an Allogeneic stem cell transplant.     ______________________________________________    Suzen Frank, MSW, LCSW  Bone Marrow Transplant Social Worker  [facility]  Phone Number: [phone]        TERS Scoring    Characteristic Weight Assessment value Total points   Prior Axis I disorder   4.0 3 - current Axis I diagnosis (not an adjustment disorder due to health) 12   Prior Axis II disorder    4.0 1 - NONE 4   Substance Use/Abuse 3.0 1- No history of heavy use/abuse of alcohol or drugs; or true social drinking; or limited drug experimentation 3   Compliance 3.0 1- Appropriately compliant throughout treatment 3   Health Behaviors 2.5 1- Practiced good health behaviors (exercise, no tobacco, diet, etc)  prior to illness  2.5   Quality of Family/Social Support 2.5 1- Good-Excellent: friends/family members present and available; willing to focus on patient's needs 2.5   Prior history of Coping 2.5 1- Good-Excellent: Adapts to problems and changes flexibly; has extensive repertoire of coping behaviors 2.5   Coping with Disease and Treatment 2.5 1-Resolution of feelings about diagnosis; considers treatment options with realistic balance of hope and concern for future 2.5   Quality of Affect 1.5 1-Appropriate fears; some anxiety; appropriate sadness 1.5   Mental Status (past and present) 1.0 1-No cognitive impairment or disorder of attention; normal sleep-wake cycle; normal activity level and responsiveness 1               TOTAL SCORE:  34.5

## 2024-02-16 NOTE — Unmapped (Signed)
 UA and VRE collected and sent to core lab. Rational for collecting these samples explained. Pt education attached to chart. Patient verbalized understanding. Time spent 5 minutes.

## 2024-02-16 NOTE — Unmapped (Signed)
 Timed up and go completed at 7.2 seconds.

## 2024-02-17 DIAGNOSIS — C9100 Acute lymphoblastic leukemia not having achieved remission: Principal | ICD-10-CM

## 2024-02-17 LAB — HTLV I/II ANTIBODY: HTLV I/II ANTIBODIES: NEGATIVE

## 2024-02-18 LAB — HEPATITIS B SURFACE ANTIGEN: HEPATITIS B SURFACE ANTIGEN: NONREACTIVE

## 2024-02-18 LAB — HEPATITIS C ANTIBODY
HCV S/CO VALUE: 0.07
HEPATITIS C ANTIBODY: NONREACTIVE

## 2024-02-18 LAB — HIV ANTIGEN/ANTIBODY COMBO: HIV ANTIGEN/ANTIBODY COMBO: NONREACTIVE

## 2024-02-18 LAB — HEPATITIS B CORE ANTIBODY, TOTAL: HEPATITIS B CORE TOTAL ANTIBODY: NONREACTIVE

## 2024-02-19 ENCOUNTER — Encounter
Admit: 2024-02-19 | Discharge: 2024-02-19 | Payer: PRIVATE HEALTH INSURANCE | Attending: Internal Medicine | Primary: Internal Medicine

## 2024-02-19 DIAGNOSIS — C9100 Acute lymphoblastic leukemia not having achieved remission: Principal | ICD-10-CM

## 2024-02-19 LAB — TOXOPLASMA GONDII ANTIBODY, IGG: TOXOPLASMA GONDII IGG: NEGATIVE

## 2024-02-19 LAB — HSV ANTIBODIES, IGG
HERPES SIMPLEX VIRUS 1 IGG: POSITIVE — AB
HERPES SIMPLEX VIRUS 2 IGG: POSITIVE — AB
HSV 1 IGG OD: 49.7

## 2024-02-19 LAB — EPSTEIN-BARR VIRUS ANTIBODY PANEL
EPSTEIN-BARR NUCLEAR ANTIGEN AB: POSITIVE — AB
EPSTEIN-BARR VCA IGG ANTIBODY: POSITIVE — AB
EPSTEIN-BARR VCA IGM ANTIBODY: NEGATIVE

## 2024-02-19 LAB — CMV IGG: CMV IGG: POSITIVE — AB

## 2024-02-19 LAB — SYPHILIS SCREEN: SYPHILIS RPR SCREEN: NONREACTIVE

## 2024-02-20 LAB — VARICELLA ZOSTER ANTIBODY, IGG: VARICELLA ZOSTER IGG: POSITIVE

## 2024-02-21 DIAGNOSIS — C9100 Acute lymphoblastic leukemia not having achieved remission: Principal | ICD-10-CM

## 2024-02-21 DIAGNOSIS — Z005 Encounter for examination of potential donor of organ and tissue: Principal | ICD-10-CM

## 2024-02-21 LAB — HLA CL I&II, HIGH RES

## 2024-02-22 ENCOUNTER — Ambulatory Visit: Admit: 2024-02-22 | Discharge: 2024-02-23 | Payer: PRIVATE HEALTH INSURANCE

## 2024-02-22 ENCOUNTER — Inpatient Hospital Stay: Admit: 2024-02-22 | Discharge: 2024-02-23 | Payer: PRIVATE HEALTH INSURANCE

## 2024-02-22 ENCOUNTER — Ambulatory Visit
Admit: 2024-02-22 | Discharge: 2024-02-23 | Payer: PRIVATE HEALTH INSURANCE | Attending: Primary Care | Primary: Primary Care

## 2024-02-22 ENCOUNTER — Inpatient Hospital Stay
Admit: 2024-02-22 | Discharge: 2024-02-23 | Payer: PRIVATE HEALTH INSURANCE | Attending: Adult Health | Primary: Adult Health

## 2024-02-22 DIAGNOSIS — Z794 Long term (current) use of insulin: Principal | ICD-10-CM

## 2024-02-22 DIAGNOSIS — F419 Anxiety disorder, unspecified: Principal | ICD-10-CM

## 2024-02-22 DIAGNOSIS — I10 Essential (primary) hypertension: Principal | ICD-10-CM

## 2024-02-22 DIAGNOSIS — C9100 Acute lymphoblastic leukemia not having achieved remission: Principal | ICD-10-CM

## 2024-02-22 DIAGNOSIS — Z8619 Personal history of other infectious and parasitic diseases: Principal | ICD-10-CM

## 2024-02-22 DIAGNOSIS — F32A Anxiety and depression: Principal | ICD-10-CM

## 2024-02-22 DIAGNOSIS — E119 Type 2 diabetes mellitus without complications: Principal | ICD-10-CM

## 2024-02-22 DIAGNOSIS — Z7682 Awaiting organ transplant status: Principal | ICD-10-CM

## 2024-02-22 LAB — CBC W/ AUTO DIFF
BASOPHILS ABSOLUTE COUNT: 0 10*9/L (ref 0.0–0.1)
BASOPHILS RELATIVE PERCENT: 1 %
EOSINOPHILS ABSOLUTE COUNT: 0.3 10*9/L (ref 0.0–0.5)
EOSINOPHILS RELATIVE PERCENT: 6.7 %
HEMATOCRIT: 33.3 % — ABNORMAL LOW (ref 34.0–44.0)
HEMOGLOBIN: 11.4 g/dL (ref 11.3–14.9)
LYMPHOCYTES ABSOLUTE COUNT: 1.3 10*9/L (ref 1.1–3.6)
LYMPHOCYTES RELATIVE PERCENT: 30.3 %
MEAN CORPUSCULAR HEMOGLOBIN CONC: 34.3 g/dL (ref 32.0–36.0)
MEAN CORPUSCULAR HEMOGLOBIN: 32 pg (ref 25.9–32.4)
MEAN CORPUSCULAR VOLUME: 93.4 fL (ref 77.6–95.7)
MEAN PLATELET VOLUME: 9.8 fL (ref 6.8–10.7)
MONOCYTES ABSOLUTE COUNT: 0.5 10*9/L (ref 0.3–0.8)
MONOCYTES RELATIVE PERCENT: 10.7 %
NEUTROPHILS ABSOLUTE COUNT: 2.2 10*9/L (ref 1.8–7.8)
NEUTROPHILS RELATIVE PERCENT: 51.3 %
PLATELET COUNT: 128 10*9/L — ABNORMAL LOW (ref 150–450)
RED BLOOD CELL COUNT: 3.57 10*12/L — ABNORMAL LOW (ref 3.95–5.13)
RED CELL DISTRIBUTION WIDTH: 14.5 % (ref 12.2–15.2)
WBC ADJUSTED: 4.3 10*9/L (ref 3.6–11.2)

## 2024-02-22 LAB — HEMATOPATHOLOGY LEUKEMIA/LYMPHOMA FLOW CYTOMETRY, CSF
NUCLEATED CELLS, CSF: 23 ul — ABNORMAL HIGH (ref <=5)
RBC CSF: 101 ul — ABNORMAL HIGH (ref <2)

## 2024-02-22 MED ADMIN — traMADol (ULTRAM) tablet 50 mg: 50 mg | ORAL | @ 18:00:00 | Stop: 2024-02-22

## 2024-02-22 MED ADMIN — midazolam (VERSED) injection 3 mg: 3 mg | INTRAVENOUS | @ 18:00:00 | Stop: 2024-02-22

## 2024-02-22 MED ADMIN — methotrexate (PRESERVATIVE FREE) 12 mg, hydrocortisone sod succ (Solu-CORTEF) 50 mg in sodium chloride (NS) 0.9 % 6 mL INTRATHECAL syringe: INTRATHECAL | @ 18:00:00 | Stop: 2024-02-22

## 2024-02-22 MED ADMIN — heparin, porcine (PF) 100 unit/mL injection 500 Units: 500 [IU] | INTRAVENOUS | @ 20:00:00 | Stop: 2024-02-23

## 2024-02-22 NOTE — Unmapped (Signed)
 Lumbar Puncture Procedure Note  Name: Patricia [name]  Date: 02/22/2024  MRN: [id number]  DOB: 1976/07/04    Diagnosis: Ph + B-ALL    Indications: IT PPX    Premed: Versed  3mg     Pre-procedural Planning      Contraindications to performing lumbar puncture: none     Imaging prior to lumbar puncture: Generally, imaging should be obtained prior to performing a lumbar puncture if the patient is > 47 years old, immunocompromised, has had a seizure within one week of presentation, has an abnormal level of consciousness, or an abnormal neurologic exam (including papilledema).      Antiplatelet Agents: This patient is not on an antiplatelet agent.     Systemic Anticoagulation: This patient is not on full systemic anticoagulation.     Significant Labs:  Plt =  128         Consent: Informed consent was obtained. Risks of the procedure were discussed including: infection, bleeding, pain and headache.    A time-out in which Her patient identifiers were checked by 2 providers was performed.    Procedure Details      The patient was positioned under sterile conditions. Betadine solution and sterile drapes were utilized. Local anesthesia with 1% lidocaine  was applied subcutaneously then deep to the skin. A spinal needle was inserted at the L3 - L4 interspace. Spinal fluid was obtained and sent to the laboratory.    Intrathecal MTX was administered over 5 min.    The spinal needle with trocar was removed with minimal bleeding noted upon removal. A sterile bandage was placed over the puncture site after holding pressure.      Findings  6mL of clear spinal fluid was obtained.    Complications:  None; patient tolerated the procedure well.          Condition: stable    Plan  Tramadol  50mg  given for pain.Bed rest for 1 hour.

## 2024-02-22 NOTE — Unmapped (Signed)
 Pt left CADD pump [id number] after completion of therapy. Pt had port de=accessed following lumbar procedure, pt remained flat for 1 hour left w cg, declined avs

## 2024-02-22 NOTE — Unmapped (Signed)
 RED ZONE Means: RED ZONE: Take action now!     You need to be seen right away  Symptoms are at a severe level of discomfort    Call 911 or go to your nearest  Hospital for help     - Bleeding that will not stop    - Hard to breathe    - New seizure - Chest pain  - Fall or passing out  -Thoughts of hurting    yourself or others      Call 911 if you are going into the RED ZONE                  YELLOW ZONE Means:     Please call with any new or worsening symptom(s), even if not on this list.  Call [phone]  After hours, weekends, and holidays - you will reach a long recording with specific instructions, If not in an emergency such as above, please listen closely all the way to the end and choose the option that relates to your need.   You can be seen by a provider the same day through our Same Day Acute Care for Patients with Cancer program.      YELLOW ZONE: Take action today     Symptoms are new or worsening  You are not within your goal range for:    - Pain    - Shortness of breath    - Bleeding (nose, urine, stool, wound)    - Feeling sick to your stomach and throwing up    - Mouth sores/pain in your mouth or throat    - Hard stool or very loose stools (increase in       ostomy output)    - No urine for 12 hours    - Feeding tube or other catheter/tube issue    - Redness or pain at previous IV or port/catheter site    - Depressed or anxiety   - Swelling (leg, arm, abdomen,     face, neck)  - Skin rash or skin changes  - Wound issues (redness, drainage,    re-opened)  - Confusion  - Vision changes  - Fever >100.4 F or chills  - Worsening cough with mucus that is    green, yellow, or bloody  - Pain or burning when going to the    bathroom  - Home Infusion Pump Issue- call    [phone]         Call your healthcare provider if you are going into the YELLOW ZONE     GREEN ZONE Means:  Your symptoms are under controls  Continue to take your medicine as ordered  Keep all visits to the provider GREEN ZONE: You are in control  No increase or worsening symptoms  Able to take your medicine  Able to drink and eat    - DO NOT use MyChart messages to report red or yellow symptoms. Allow up to 3    business days for a reply.  -MyChart is for non-urgent medication refills, scheduling requests, or other general questions.         [id number] Rev. 01/28/2022  Approved by Oncology Patient Education Committee        Hospital Outpatient Visit on 02/22/2024   Component Date Value Ref Range Status    Protein, CSF 02/22/2024 52  20 - 59 mg/dL Final    Glucose, CSF 92/75/7974 97 (H)  48 - 79 mg/dL Final    Tube #  CSF 02/22/2024 Tube 1   Preliminary    Color, CSF 02/22/2024 Colorless   Preliminary    Appearance, CSF 02/22/2024 Clear   Preliminary    Nucleated Cells, CSF 02/22/2024 23 (H)  <=5 ul Preliminary    RBC, CSF 02/22/2024 101 (H)  <2 ul Preliminary   Hospital Outpatient Visit on 02/22/2024   Component Date Value Ref Range Status    WBC 02/22/2024 4.3  3.6 - 11.2 10*9/L Final    RBC 02/22/2024 3.57 (L)  3.95 - 5.13 10*12/L Final    HGB 02/22/2024 11.4  11.3 - 14.9 g/dL Final    HCT 92/75/7974 33.3 (L)  34.0 - 44.0 % Final    MCV 02/22/2024 93.4  77.6 - 95.7 fL Final    MCH 02/22/2024 32.0  25.9 - 32.4 pg Final    MCHC 02/22/2024 34.3  32.0 - 36.0 g/dL Final    RDW 92/75/7974 14.5  12.2 - 15.2 % Final    MPV 02/22/2024 9.8  6.8 - 10.7 fL Final    Platelet 02/22/2024 128 (L)  150 - 450 10*9/L Final    Neutrophils % 02/22/2024 51.3  % Final    Lymphocytes % 02/22/2024 30.3  % Final    Monocytes % 02/22/2024 10.7  % Final    Eosinophils % 02/22/2024 6.7  % Final    Basophils % 02/22/2024 1.0  % Final    Absolute Neutrophils 02/22/2024 2.2  1.8 - 7.8 10*9/L Final    Absolute Lymphocytes 02/22/2024 1.3  1.1 - 3.6 10*9/L Final    Absolute Monocytes 02/22/2024 0.5  0.3 - 0.8 10*9/L Final    Absolute Eosinophils 02/22/2024 0.3  0.0 - 0.5 10*9/L Final    Absolute Basophils 02/22/2024 0.0  0.0 - 0.1 10*9/L Final   ]

## 2024-02-22 NOTE — Unmapped (Cosign Needed)
 FUNCTIONAL ASSESSMENT REVIEW    Patricia [name]  48 y.o. 1975/10/01   BMT MD/APP:  Katarzyna Jamieson/Gracin Soohoo      Referring MD: Toribio Bunker    Identifying Statement: Patricia [name] is a 48 y.o. 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       History of Present Illness:   Patricia [name] was initially diagnosed in March 2023, she started treatment on the [employer] protocol (ponatinib  + maintenance (vincristine , prednisone  and Rituximab ) Completed 5 cycles then transitioned to maintenance.   Course was complicated by multiple hospitalizations for infections.   She unfortunately developed CNS relapse while on maintenance and also with MRD + marrow so was started on Blinatumomab .   She has completed cycle #2 of Blinatumomab  + Ponatinib  on 01/25/24.   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     Arrives with her husband today.  Does get fatigued after walking 6-10 min then will rest and return to normal.  Smaller activities are fine.  No assitive devices.   Asthama as kid and some seasonal allergies to mold, has albuerol has not used in quite some time.  Former smoker, quit about 8 years ago  Type 2 DM. Has been on insulin  since May. Wears the Libre glucose sensors. Blood sugars; fluctuant 250 at night. Has Endocrinologist locally.   No nausea no vomiting. Has been losing weight.  Treatement made constipation worse, takes stool softeners prn.   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 Follows with local therapist.       Past Medical History[1]     Current Medications[2]  Allergies[3]      Review of Systems:  Constitutional: Denies fever, chills, sweats, unexplained weight loss   Eyes: Denies vision changes, eye pain, eye redness   ENT: Denies headaches, sore throat, hoarseness, sneezing, vertigo, hearing loss, tinnitus, neck pain, stiffness, dizziness, tooth pain, gum pain, mouth ulcers Skin: Denies rashes, sores, jaundice, itching, dryness   Cardiovascular: Denies chest pain, shortness of breath (at rest or with exertion), edema   Pulmonary: Denies chest congestion, cough, SOB  Endocrine: Denies polyuria, polydipsia, heat/cold intolerance   Gastrointestinal/Abdomen: Denies heartburn, early satiety, nausea, vomiting, abdominal pain, changes in bowel habits, blood per rectum  Genito-urinary: Denies frequency, urgency, dysuria, hematuria, nocturia   Musculoskeletal/Extremities: Denies myalgias, arthralgias, joint swelling, joint pain   Neurologic:  + PN, no weakness, changes in gait, confusion, slurred speech   Psychology:  Denies change in mood, insomnia   Heme/Lymph: As per HPI/PMH      Physical Exam:  General : No acute distress noted.   Central venous access: Has right chest port. Line clean, dry, intact. No erythema or drainage noted.   ENT: Moist mucous membranes. Oropharhynx without lesions, erythema or exudate.   Cardiovascular: Pulse normal rate, regularity and rhythm. S1 and S2 normal, without any murmur, rub, or gallop.  Lungs: Clear to auscultation bilaterally, without wheezes/crackles/rhonchi. Good air movement.   Skin: Warm, dry, intact. No rash noted.   Psychiatry: Alert and oriented to person, place, and time.   GI : Normoactive bowel sounds, abdomen soft, non-tender   Extremeties: No edema.   Musculo Skeletal: Full range of motion in shoulder, elbow, hip knee, ankle, left hand and feet.  Neurologic: CNII-XII intact. Normal strength and sensation throughout    Assessment and Plan:    Diagnosis:  Ph + Patricia  Patricia [name] [id number] 48 y.o. 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 No related donors.   Conditioning: TBI/Cy Recipient: Blood Type: O+;CMV Status: positive        Comorbidity Index Score:   aaHCT-CI Score=6  80, Normal activity with effort; some signs or symptoms of disease (ECOG equivalent 1)   (1) Arrhythmia  af/afib   (1) Cardiac  cad/chf/mi/ef<50   (1) CVA  tia/cva   (1) Diabetes X treatment   (3) Heart Valve     (1) Hepa Mild  bili<1.5/ast<2.5   (3)Hepa Severe  bili>1.5/ast>2.5   (1) Infection X abx s/p day 0   (1) Inflam Bowel Disease     (1) Obesity  BMI > 35   (2) Peptic ulcer  treatment   (1) Psych X treatment   (2) Pulm mod X Dlco and/or FEV1 66-80% or dyspnea on slight activity   (3) Pulm severe  dlco <65 or dyspnea at rest or requiring oxygen   (2) Renal  CR>2   (2) Rheum  SLE/RA   (3) Solid tumor  treat xskin   Other Co-morbid     Age 5 and  Greater X     Timed Up and Go Test: 7.2 seconds     6 Minute Walk Test   Distance walked: 1299 feet/396 meters  Oxygen saturation pre-walk/post-walk: 95/95%    Heart rate pre-walk/post-walk: 84/88 bpm Max HR: 132 bpm  Cardiovascular   ECHO: EF 45-50% borderline. There is grade I diastolic dysfunction  The LV global longitudinal strain tracking is suboptimal to accurately report the GLS measurement.     EKG:NSR, prolonged QTc   Cardiology consult: ?  Pulmonary   PFTs: FVC-84%; FEV1-79%, DLCOc ([place])-67.49%  CXR: WNL  Caregiver plan/Concerns   Primary: Devaughn Ranger, patient's husband. Working fulltime so Engineer, production.      Back up:   For daytime and PRN  Alexis Colarusso, friend   Alicia Chistofferson, friend   Cipriana Biller, patient's mother (lives in [place] but retired and flexible)     TERS score: 34.5  Social concerns: Per SW - She has a history of depression/anxiety and undiagnosed ADHD. She takes Lexapro  for the anxiety/depression and she has access to a therapist she meets with when she needs to. Her anxiety scores were a little high  so she is planning to reach out to her local counselor instead of getting connected to [facility] for more supports.    She lives about 50 minutes away [facility] wants to return home. Has a friend that lives closer to [facility] if needed for recovery period. No travel/lodging benefit.  Complex case: No    Other concerns/referrals/consults needed:   T2DM:Takes Lantus  and SSI at home.   HTN - HLD: Losartan . She self-discontinued Rosuvastatin .   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 Continues on Letermovir . Undetectable PCRs  Anxiety/Depression: takes Lexapro , therapy as above    Dental: No     Today we reviewed the results of the Echo, PFTs, Caregiver plans, Dental Clearance.     Other co morbidities:   T2DM: Chronic condition. Takes Lantus  and SSI at home.   HTN - HLD: Chronic conditions, taking Losartan  at home. She self-discontinued  Rosuvastatin .   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 Continues on Letermovir . CMV PCR negative since mid March, no longer monitoring  Anxiety/Depression: Chronic condition, takes Lexapro    Hemorrhoids, Anal fissure: manages with topical  Menses suppression and birth control: previously received Lupron  with last dose 07/26/22, has not had any menses since her last Lupron  dose  Hypogammaglobulinemia: receiving monthly IVIG    Referrals needed: Possible cardiology?    Potential studies: Is the patient eligible for any of the studies below Yes.. If yes, please contact [name] Markey or [employer] .    Currently Open Studies - Pre-HCT         Short Name/  PI Title/ Contact Eligibility - Inclusion Exclusions Timing/ Special   BMT CTN 2203    Snow     A Randomized, Multicenter, Phase III Trial of Tacrolimus/Methotrexate /Ruxolitinib versus Post-Transplant Cyclophosphamide /Tacrolimus/Mycophenolate Mofetil in Non-Myeloablative/Reduced Intensity Conditioning Allogeneic Peripheral Blood Stem Cell Transplantation    Rolin Banister [phone]   Dose Finding - 10mg  and 5mg  Rux BID  >=18yo   Acute leukemia or CML, MDS, CMML, lymphoma  NMA/ RIC  MRD or 7-8/8 UD  PBSC Prior allo  Secondary AML from MPN or overlap syndromes  MF  Prior use of immune checkpt inhibitors Dose Finding Phase - 10mg  vs 5mg  Rux BID  Randomization Phase - Tac/MTX/Rux vs PTCy/Tac/MMF   BMT CTN 2207    Wood     A Phase II Trial of Non-Myeloablative Conditioning and Transplantation of Haploidentical Related, Partially HLA-Mismatched, or Matched Unrelated Bone Marrow for Newly Diagnosed Patients with Severe Aplastic Anemia    Duwaine Hatchet [phone]   >25 yo to <75 yo  MMUD or MUD  BM not PBSC Prior treatment for SAA (ATG, CNIs)  Full match - MRD  BM failure syndrome  Pre-MDS/MDS IDS to order ATG prior to admission   Optimize    Jamieson       A Phase II Study of Reduced Dose Post Transplantation Cyclophosphamide  as GvHD Prophylaxis in Adult Patients with Hematologic Malignancies Receiving HLA-Mismatched Unrelated Donor Peripheral Blood Stem Cell Transplantation    Duwaine Hatchet [phone]/ Rolin Banister [phone]           Stratum 1 = 18-66yo with AML, Patricia, AL, MDS - MAC  Stratum 2 = >=18yo with Acute or chronic leukemia, CML, MDS, CLL, CMML, lymphoma - NMA/ RIC   Stratum 3 = >=18yo with MF - NMA/ RIC  MMUD - down to 4/8   Bone Marrow graft  Full match - MRD or MUD Must check DSA  Search strategy report not required   TSCAN    Snow   A Controlled Multi-Arm Phase 1 Umbrella Study Evaluating the Safety and Feasibility of T-Cell Receptor Engineered Donor T-Cells Targeting HA-1 (TSC-100) or HA-2 (TSC-101) in HLA-A*02:01 Positive Patients Undergoing Haploidentical Allogeneic Peripheral Blood Stem Cell Transplantation    Duwaine Hatchet [phone]     >=18yo  >=16yo Haplo related donor (sponsor apprvl for >=12yo)  AML, MDS, Patricia  RIC, PBSC  HLA-A *02:01 TSC-101, but HLA-A*02:07 positive  Prio allo  Donor is HLA-A *02:01 1st leukapheresis is for study  Must agree to 12yr follow up study  Present/ consent around FAV    Observational      IGHID [id number]    [place] The Gut Microbiome and in Recipients of CAR-T Cell Therapy    Rolin Banister [phone]/ Estefana Alba >=18yo  Hem malignancy  CART and [employer]   Prior gastric bypass or gut altering  surgery First sample due prior to start of conditioning  Blood and stool   NMDP Sample Repository    [place] NMDP Research Sample Repository: National Marrow Donor Program Repository Database  (bank for future research)    BMT and Donor Coordinators Donor and recipient both consent    Recipient - prior to conditioning for HCT  Donor - any time  Separate HIPAA   CIBMTR Data Registry    [place] Participation in the [facility] Marrow Donor Program and Center for International Blood and Marrow Transplant Research Registry for Patients Undergoing Matched Related or Unrelated Donor Transplants    BMT and Donor Coordinators Donor and recipient both consent    Data Registry for Patricia transplant patients  Separate HIPAA         Currently Open Studies - Post-HCT  Short Name/  PI Title/ Contact Eligibility - Inclusion Exclusions Timing/ Special    Chronic GVHD      THRIVE - [id number]    Wood   A Prospective, Observational Cohort Study of Participants at Risk for Chronic Graft-Versus-Host Disease in the [place]  (THRIVE)    [name] Markey [phone]/ Rolin Banister [phone]   >=18yo  Post Allo - 90 - 180 days  Willing to complete surveys   None   Monthly surveys for 41yr, then every other month for 2 years  Paid $25/month     [id number]     [place]  On hold A randomized, double-blind, multicenter, Phase 3 study to  evaluate efficacy and safety of belumosudil in combination  with corticosteroids versus placebo in combination with  corticosteroids in participants at least 47 years of age with  newly diagnosed 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 The patients BMT coordinator will be in touch for next steps and with information about follow up visit. If questions arise before the next visit please contact the transplant coordinator for assistance.     Erinexpressed understanding and agreement of the information provided and discussed today. Patricia [name].     Patricia [name], ANP  [facility]    I personally spent 50 minutes face-to-face and non-face-to-face in the care of this patient, which includes Patricia pre, intra, and post visit time on the date of service.         [1]   Past Medical History:  Diagnosis Date    B-cell acute lymphoblastic leukemia (Patricia)    10/29/2021    EBV infection    [2]   Current Outpatient Medications   Medication Sig Dispense Refill    acetaminophen  (TYLENOL  8 HOUR) 650 MG CR tablet Take 2 tablets (1,300 mg total) by mouth every eight (8) hours as needed for pain.      albuterol  HFA 90 mcg/actuation inhaler Inhale 2 puffs every six (6) hours as needed for wheezing. 8 g 1    aspirin  (ECOTRIN) 81  MG tablet Take 1 tablet (81 mg total) by mouth daily.      blood sugar diagnostic (GLUCOSE BLOOD) Strp Use to check blood sugar two times a day. 100 each 0    blood-glucose meter kit Use to check blood sugar two times a day. Use as instructed. 1 each 0    cetirizine  (ZYRTEC ) 10 MG tablet Take 1 tablet (10 mg total) by mouth nightly. 90 tablet 3    dapsone  100 MG tablet Take 1 tablet (100 mg total) by mouth daily. TAKE 1 TABLET(100 MG) BY MOUTH DAILY 30 tablet 11    escitalopram  oxalate (LEXAPRO ) 10 MG tablet Take 1 tablet (10 mg total) by mouth daily. 90 tablet 2    insulin  glargine (LANTUS  SOLOSTAR U-100 INSULIN ) 100 unit/mL (3 mL) injection pen Inject 0.22 mL (22 Units total) under the skin nightly.      lancets Misc Use to check blood sugar as directed 2 times a day & for symptoms of high or low blood sugar. 100 each 0    letermovir  (PREVYMIS ) 480 mg tablet Take 1 tablet (480 mg total) by mouth daily. 28 tablet 5    losartan  (COZAAR ) 50 MG tablet Take 1 tablet (50 mg total) by mouth in the morning.      PONATinib  (ICLUSIG ) 30 mg tablet Take 1 tablet (30 mg total) by mouth daily. Swallow tablets whole. Do not crush, break, cut or chew tablets. 30 tablet 5    [Paused] rosuvastatin  (CRESTOR ) 10 MG tablet Take 1 tablet (10 mg total) by mouth nightly. (Patient not taking: Reported on 01/31/2024) 90 tablet 3    valACYclovir  (VALTREX ) 500 MG tablet Take 1 tablet (500 mg total) by mouth daily. 30 tablet 12     No current facility-administered medications for this visit.     Facility-Administered Medications Ordered in Other Visits   Medication Dose Route Frequency Provider Last Rate Last Admin    methotrexate  (PRESERVATIVE FREE) 12 mg, hydrocortisone  sod succ (Solu-CORTEF ) 50 mg in sodium chloride  (NS) 0.9 % 6 mL INTRATHECAL syringe   Intrathecal Once Estelle Toribio SAUNDERS, MD        OKAY TO SEND MEDICATION/CHEMOTHERAPY TO OUTPATIENT UNIT   Other Once Estelle Toribio SAUNDERS, MD       [3]   Allergies  Allergen Reactions    Erythromycin Hives     Other reaction(s): Not available    Other      Pt cant take ibuprofen due to condition.    Sulfa (Sulfonamide Antibiotics) Anaphylaxis     Other reaction(s): Not available    Azithromycin Hives     Per patient report

## 2024-02-22 NOTE — Unmapped (Signed)
 Pt walked to clinic w a cg, pt given iv push medications, timeout performed, consent signed, pump disconnected and labs drawn

## 2024-02-23 LAB — SPINAL FLUID, PATH REVIEW

## 2024-02-24 LAB — HEMOGLOBIN A1C: Hemoglobin A1C: 5.2

## 2024-02-26 ENCOUNTER — Inpatient Hospital Stay: Admit: 2024-02-26 | Discharge: 2024-02-26 | Payer: PRIVATE HEALTH INSURANCE

## 2024-02-26 DIAGNOSIS — C9100 Acute lymphoblastic leukemia not having achieved remission: Principal | ICD-10-CM

## 2024-02-26 LAB — HLA CL I&II, HIGH RES

## 2024-02-26 MED ADMIN — acetaminophen (TYLENOL) tablet 650 mg: 650 mg | ORAL | @ 12:00:00 | Stop: 2024-02-26

## 2024-02-26 MED ADMIN — diphenhydrAMINE (BENADRYL) capsule/tablet 25 mg: 25 mg | ORAL | @ 12:00:00 | Stop: 2024-02-26

## 2024-02-26 MED ADMIN — immun glob G(IgG)-pro-IgA 0-50 (PRIVIGEN) 10 % intravenous solution 30 g: .4 g/kg | INTRAVENOUS | @ 14:00:00 | Stop: 2024-02-26

## 2024-02-26 NOTE — Unmapped (Signed)
 0820: Pt here for scheduled infusion.     1145: Pt tolerated treatment/infusion W/O difficulty. Port de accessed per protocol.   Pt left infusion center ambulatory. NAD, no questions nor complaints voiced at D/C. Pt aware of follow up.

## 2024-02-27 DIAGNOSIS — C9101 Acute lymphoblastic leukemia, in remission: Principal | ICD-10-CM

## 2024-02-27 DIAGNOSIS — C9100 Acute lymphoblastic leukemia not having achieved remission: Principal | ICD-10-CM

## 2024-02-27 LAB — HEMATOPATHOLOGY LEUKEMIA/LYMPHOMA FLOW CYTOMETRY, CSF
LYMPHS CSF: 82 %
MONO/MACROPHAGE CSF: 18 %
NUCLEATED CELLS, CSF: 23 ul — ABNORMAL HIGH (ref <=5)
NUMBER OF CELLS CSF: 100
RBC CSF: 101 ul — ABNORMAL HIGH (ref <2)

## 2024-02-27 LAB — HLA CL I&II, HIGH RES

## 2024-02-28 DIAGNOSIS — C9101 Acute lymphoblastic leukemia, in remission: Principal | ICD-10-CM

## 2024-02-28 DIAGNOSIS — C9100 Acute lymphoblastic leukemia not having achieved remission: Principal | ICD-10-CM

## 2024-02-28 NOTE — Unmapped (Signed)
 Error

## 2024-02-29 DIAGNOSIS — C9100 Acute lymphoblastic leukemia not having achieved remission: Principal | ICD-10-CM

## 2024-02-29 NOTE — Unmapped (Signed)
 Spoke with provider about treatment schedule, patient request to not have LP in [place] due to past experience. With availability and team aware will reschedule to next week prior to chemo Thurs. Plan for LP Wednesday at bedside, provider visit next Thurs prior to chemo (no need to come today 7/31). Patient very grateful. Will update schedule and see via MyChart. No further questions.

## 2024-03-01 DIAGNOSIS — Z7682 Awaiting organ transplant status: Principal | ICD-10-CM

## 2024-03-01 DIAGNOSIS — C9100 Acute lymphoblastic leukemia not having achieved remission: Principal | ICD-10-CM

## 2024-03-04 DIAGNOSIS — C9100 Acute lymphoblastic leukemia not having achieved remission: Principal | ICD-10-CM

## 2024-03-04 NOTE — Unmapped (Signed)
 [facility] Specialty and Home Delivery Pharmacy Refill Coordination Note    Patricia [name], DOB: 08/24/1975  Phone: [phone] (home)       All above HIPAA information was verified with patient.         03/03/2024    10:59 AM   Specialty Rx Medication Refill Questionnaire   Which Medications would you like refilled and shipped? Both, Prevymis  - I have 13 days , Iclusic - I have 13 days   Please list all current allergies: No new allergies   Have you missed any doses in the last 30 days? No   Have you had any changes to your medication(s) since your last refill? No   How much of each medication do you have remaining at home? (eg. number of tablets, injections, etc.) 13 days of each   Have you experienced any side effects in the last 30 days? No   Please enter the full address (street address, city, state, zip code) where you would like your medication(s) to be delivered to. [address], [place] [address]   Please specify on which day you would like your medication(s) to arrive. Note: if you need your medication(s) within 3 days, please call the pharmacy to schedule your order at [phone]  03/08/2024   Has your insurance changed since your last refill? No   Would you like a pharmacist to call you to discuss your medication(s)? No   Do you require a signature for your package? (Note: if we are billing Medicare Part B or your order contains a controlled substance, we will require a signature) No   I have been provided my out of pocket cost for my medication and approve the pharmacy to charge the amount to my credit card on file. Yes         Completed refill call assessment today to schedule patient's medication shipment from the [facility] Delivery Pharm[facility] [phone]).  All relevant notes have been reviewed.       Confirmed patient received a Conservation officer, historic buildings and a Surveyor, mining with first shipment. The patient will receive a drug information handout for each medication shipped and additional FDA Medication Guides as required.         REFERRAL TO PHARMACIST     Referral to the pharmacist: Not needed      [facility]     Shipping address confirmed in Epic.     Delivery Scheduled: Yes, Expected medication delivery date: 03/08/2024.     Medication will be delivered via Next Day Courier to the prescription address in Epic WAM.    Patricia [name]   [facility] Specialty and Home Delivery Pharmacy Specialty Technician

## 2024-03-06 ENCOUNTER — Inpatient Hospital Stay
Admit: 2024-03-06 | Discharge: 2024-03-06 | Payer: Medicaid (Managed Care) | Attending: Adult Health | Primary: Adult Health

## 2024-03-06 ENCOUNTER — Other Ambulatory Visit: Admit: 2024-03-06 | Discharge: 2024-03-06 | Payer: Medicaid (Managed Care)

## 2024-03-06 DIAGNOSIS — C9100 Acute lymphoblastic leukemia not having achieved remission: Principal | ICD-10-CM

## 2024-03-06 LAB — CBC W/ AUTO DIFF
BASOPHILS ABSOLUTE COUNT: 0 10*9/L (ref 0.0–0.1)
BASOPHILS RELATIVE PERCENT: 0.6 %
EOSINOPHILS ABSOLUTE COUNT: 0.2 10*9/L (ref 0.0–0.5)
EOSINOPHILS RELATIVE PERCENT: 4.3 %
HEMATOCRIT: 31.2 % — ABNORMAL LOW (ref 34.0–44.0)
HEMOGLOBIN: 10.8 g/dL — ABNORMAL LOW (ref 11.3–14.9)
LYMPHOCYTES ABSOLUTE COUNT: 1 10*9/L — ABNORMAL LOW (ref 1.1–3.6)
LYMPHOCYTES RELATIVE PERCENT: 21.8 %
MEAN CORPUSCULAR HEMOGLOBIN CONC: 34.7 g/dL (ref 32.0–36.0)
MEAN CORPUSCULAR HEMOGLOBIN: 32.5 pg — ABNORMAL HIGH (ref 25.9–32.4)
MEAN CORPUSCULAR VOLUME: 93.5 fL (ref 77.6–95.7)
MEAN PLATELET VOLUME: 10.2 fL (ref 6.8–10.7)
MONOCYTES ABSOLUTE COUNT: 0.5 10*9/L (ref 0.3–0.8)
MONOCYTES RELATIVE PERCENT: 11.3 %
NEUTROPHILS ABSOLUTE COUNT: 2.8 10*9/L (ref 1.8–7.8)
NEUTROPHILS RELATIVE PERCENT: 62 %
PLATELET COUNT: 99 10*9/L — ABNORMAL LOW (ref 150–450)
RED BLOOD CELL COUNT: 3.34 10*12/L — ABNORMAL LOW (ref 3.95–5.13)
RED CELL DISTRIBUTION WIDTH: 15.7 % — ABNORMAL HIGH (ref 12.2–15.2)
WBC ADJUSTED: 4.5 10*9/L (ref 3.6–11.2)

## 2024-03-06 LAB — COMPREHENSIVE METABOLIC PANEL
ALBUMIN: 3.9 g/dL (ref 3.4–5.0)
ALKALINE PHOSPHATASE: 72 U/L (ref 46–116)
ALT (SGPT): 23 U/L (ref 10–49)
ANION GAP: 8 mmol/L (ref 5–14)
AST (SGOT): 28 U/L (ref <=34)
BILIRUBIN TOTAL: 0.4 mg/dL (ref 0.3–1.2)
BLOOD UREA NITROGEN: 11 mg/dL (ref 9–23)
BUN / CREAT RATIO: 21
CALCIUM: 9.2 mg/dL (ref 8.7–10.4)
CHLORIDE: 110 mmol/L — ABNORMAL HIGH (ref 98–107)
CO2: 22 mmol/L (ref 20.0–31.0)
CREATININE: 0.52 mg/dL — ABNORMAL LOW (ref 0.55–1.02)
EGFR CKD-EPI (2021) FEMALE: >90 mL/min/1.73m2 (ref >=60)
GLUCOSE RANDOM: 217 mg/dL — ABNORMAL HIGH (ref 70–179)
POTASSIUM: 3.9 mmol/L (ref 3.4–4.8)
PROTEIN TOTAL: 7.3 g/dL (ref 5.7–8.2)
SODIUM: 140 mmol/L (ref 135–145)

## 2024-03-06 LAB — HEMATOPATHOLOGY LEUKEMIA/LYMPHOMA FLOW CYTOMETRY, CSF
LYMPHS CSF: 93 %
MONO/MACROPHAGE CSF: 7 %
NUCLEATED CELLS, CSF: 31 ul — ABNORMAL HIGH (ref <=5)
NUMBER OF CELLS CSF: 100
RBC CSF: <2 ul (ref <2)

## 2024-03-06 LAB — MAGNESIUM: MAGNESIUM: 1.8 mg/dL (ref 1.6–2.6)

## 2024-03-06 LAB — PHOSPHORUS: PHOSPHORUS: 2.5 mg/dL (ref 2.4–5.1)

## 2024-03-06 LAB — C-REACTIVE PROTEIN: C-REACTIVE PROTEIN: 12.8 mg/L — ABNORMAL HIGH (ref <=10.0)

## 2024-03-06 MED ADMIN — midazolam (VERSED) injection 2 mg: 2 mg | INTRAVENOUS | @ 19:00:00 | Stop: 2024-03-06

## 2024-03-06 MED ADMIN — traMADol (ULTRAM) tablet 50 mg: 50 mg | ORAL | @ 19:00:00 | Stop: 2024-03-06

## 2024-03-06 MED ADMIN — methotrexate (PRESERVATIVE FREE) 12 mg, hydrocortisone sod succ (Solu-CORTEF) 50 mg in sodium chloride (NS) 0.9 % 6 mL INTRATHECAL syringe: INTRATHECAL | @ 19:00:00 | Stop: 2024-03-06

## 2024-03-06 NOTE — Unmapped (Signed)
 Please leave dressing in place and keep it dry for 24 hrs before removing. You can resume normal activities tomorrow, but take things easy today.     You may take tylenol  as needed for discomfort at the area where the biopsy was taken.   After receiving Versed , please do not drive today.    If you have questions between 8am to 5 pm Monday through Friday please call [phone] and speak to the operator.      For emergencies, evenings or weekends, please call [phone] and ask for oncology fellow on call.     Reasons to call emergency line may include:   Fever of 100.5 or greater   Nausea and/or vomiting not relieved with nausea medicine   Diarrhea or constipation   Severe pain not relieved with usual pain regimen       Lab on 03/06/2024   Component Date Value Ref Range Status    Sodium 03/06/2024 140  135 - 145 mmol/L Final    Potassium 03/06/2024 3.9  3.4 - 4.8 mmol/L Final    Chloride 03/06/2024 110 (H)  98 - 107 mmol/L Final    CO2 03/06/2024 22.0  20.0 - 31.0 mmol/L Final    Anion Gap 03/06/2024 8  5 - 14 mmol/L Final    BUN 03/06/2024 11  9 - 23 mg/dL Final    Creatinine 91/93/7974 0.52 (L)  0.55 - 1.02 mg/dL Final    BUN/Creatinine Ratio 03/06/2024 21   Final    eGFR CKD-EPI (2021) Female 03/06/2024 >90  >=60 mL/min/1.77m2 Final    eGFR calculated with CKD-EPI 2021 equation in accordance with [employer] and [employer] of Nephrology Task Force recommendations.    Glucose 03/06/2024 217 (H)  70 - 179 mg/dL Final    Calcium  03/06/2024 9.2  8.7 - 10.4 mg/dL Final    Albumin 91/93/7974 3.9  3.4 - 5.0 g/dL Final    Total Protein 03/06/2024 7.3  5.7 - 8.2 g/dL Final    Total Bilirubin 03/06/2024 0.4  0.3 - 1.2 mg/dL Final    AST 91/93/7974 28  <=34 U/L Final    ALT 03/06/2024 23  10 - 49 U/L Final    Alkaline Phosphatase 03/06/2024 72  46 - 116 U/L Final    Magnesium  03/06/2024 1.8  1.6 - 2.6 mg/dL Final    Phosphorus 91/93/7974 2.5  2.4 - 5.1 mg/dL Final    CRP 91/93/7974 12.8 (H)  <=10.0 mg/L Final    WBC 03/06/2024 4.5  3.6 - 11.2 10*9/L Final    RBC 03/06/2024 3.34 (L)  3.95 - 5.13 10*12/L Final    HGB 03/06/2024 10.8 (L)  11.3 - 14.9 g/dL Final    HCT 91/93/7974 31.2 (L)  34.0 - 44.0 % Final    MCV 03/06/2024 93.5  77.6 - 95.7 fL Final    MCH 03/06/2024 32.5 (H)  25.9 - 32.4 pg Final    MCHC 03/06/2024 34.7  32.0 - 36.0 g/dL Final    RDW 91/93/7974 15.7 (H)  12.2 - 15.2 % Final    MPV 03/06/2024 10.2  6.8 - 10.7 fL Final    Platelet 03/06/2024 99 (L)  150 - 450 10*9/L Final    Neutrophils % 03/06/2024 62.0  % Final    Lymphocytes % 03/06/2024 21.8  % Final    Monocytes % 03/06/2024 11.3  % Final    Eosinophils % 03/06/2024 4.3  % Final    Basophils % 03/06/2024  0.6  % Final    Absolute Neutrophils 03/06/2024 2.8  1.8 - 7.8 10*9/L Final    Absolute Lymphocytes 03/06/2024 1.0 (L)  1.1 - 3.6 10*9/L Final    Absolute Monocytes 03/06/2024 0.5  0.3 - 0.8 10*9/L Final    Absolute Eosinophils 03/06/2024 0.2  0.0 - 0.5 10*9/L Final    Absolute Basophils 03/06/2024 0.0  0.0 - 0.1 10*9/L Final

## 2024-03-06 NOTE — Unmapped (Signed)
 Patient presents alert and oriented, accompanied by partner, for IT Methotrexate /LP. IVAD accessed prior to infusion appt. Patient premedicated with Versed  and Trazadone. See MAR for further detail. Timeout performed prior to LP with Alyce Shaggy, MD. See time-out flowsheet for further detail. Chemo double check completed. See MAR for further detail. IVAD de-accessed post procedure. Patient discharged in stable condition, accompanied by partner.    Hikeem Andersson K, RN

## 2024-03-06 NOTE — Unmapped (Signed)
 [facility] Follow-up    [name]  Patient Age: 48 y.o.  Encounter Date: 03/07/2024    Primary Care Provider:  Estelle Toribio SAUNDERS, MD    Referring Physician:  Referring, None Per Patient  [address] [place],  [place] [address]      Cancer Diagnosis: Ph+ B-ALL; Initial Dx 10/29/2021  Cancer status: CR2, MRD negative, undetectable BCR/ABL <1/100,000 01/15/24  Treatment Regimen: Second Line, Blinatumumab (C1D1 5/13- )+ Ponatinib .                                      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 She is on second-line therapy with blinatumomab  and completed cycle two. The risk of CRS or neurotoxicity is lower in cycle 3 and beyond and we will start this cycle outpatient. The overall plan is to maintain remission and bridge to alloSCT. She reports the transplant team identified six potential donors for allogeneic stem cell transplant and that they may be ready to proceed in late September.  - Start cycle three of blinatumomab  as an outpatient.  - Monitor for signs of CRS or neurotoxicity.  - Plan two lumbar punctures at the end of the cycle per protocol.  - Order peripheral blood test for BCR/ABL1 P190 level.  - Coordinate with transplant team for potential allogeneic stem cell transplant by end of September.  - Do not start cycle four of blinatumomab  if transplant is scheduled.  - Schedule follow-up visit with Dr. Estelle during last week of blinatumomab  cycle.    Pancytopenia risk secondary to chemotherapy for B-ALL  Risk of pancytopenia due to chemotherapy. Current labs show slight decrease in platelets and hemoglobin, likely related to blinatumomab .  - Collect additional labs during current cycle of blinatumomab .    Hypogammaglobulinemia  Hypogammaglobulinemia managed with IVIG therapy. Next IVIG infusion scheduled for April 04, 2024.  - Administer IVIG on April 04, 2024, after completion of blinatumomab  cycle.    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   -09/2022 restarted Valcyte  with the goal of controlling CMV and limiting neutropenia  - continue letermovir   - d/c CMV levels as of 03/2023 - per Dr. Sheena  - 09/19/23 - CMV negative - checked due to elevated temps     Hemorrhoids, Anal fissure: persistent but stable, per patient  - supportive care at home     Menses suppression and birth control: previously received Lupron  with last dose 07/26/22   - patient stopped oral contraceptive due to weight loss. Counseled on importance of birth control on TKI. She is abstinent and we have previously discused making an appointment with GYN     Psychosocial distress: She reports a moderate level of anxiety regarding the management of the above.   - Counseling given   - Consider ref  to comprehensive cancer support program      Patient-centered care/Shared decision-making:   We discussed the plan above at length. The patient and her husband actively contributed to the conversation. Specifically, her most important outcomes are:   -Prolonging overall survival and Maintaining her overall functional activity      Supportive Care Needs: We recommend based on the patient???s underlying diagnosis and treatment history the following supportive care:     1. Antimicrobial prophylaxis:    Viral - valacyclovir  and letermovir   Bacterial: Levofloxacin  when ANC <0.5  Fungal: Fluconazole  200mg  when ANC <0.5  PJP: dapsone  100mg  daily     2. Blood product support:  Leukoreduced blood products are required.  Irradiated blood products are preferred, but in case of urgent transfusion needs non-irradiated blood products may be used: 2 units for Hg <=8.0.        Dr. Estelle  was available    Alyce Shaggy, AGPCNP-BC  Nurse Practitioner  Hematology/Oncology  [facility]        History of Present Illness:  Hematology/Oncology History Overview Note   Referring/Local Oncologist:    Diagnosis:   10/29/2021  Bone marrow, left iliac, aspiration and biopsy  -  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 The majority of the abnormal cells (64/88) contained an additional BCR/ABL1 fusion signal, while 9/88 abnormal cells contained an additional ABL1 and ASS1 signal.       Genetics:   Karyotype/FISH:   RESULTS   Date Value Ref Range Status   10/28/2021   Final    NOTE: This report reflects a combined study from a peripheral blood and a bone marrow core biopsy. Eleven cells from the peripheral blood and nine cells from the bone marrow core biopsy were analyzed. The BCR/ABL1 FISH analysis was performed on the peripheral blood.     Abnormal Karyotype:  47,XX,t(9;22)(q34;q11.2),+der(22)t(9;22)[18]/46,XX[2]    Abnormal FISH:  A BCR/ABL1 interphase FISH assay showed a signal pattern consistent with a BCR::ABL1 rearrangement and the 9;22 translocation in 88% of the 100 cells scored. The majority of the abnormal cells (64/88) contained an additional BCR/ABL1 fusion signal, while 9/88 abnormal cells contained an additional ABL1 and ASS1 signal.           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  MRD by flow insufficient, p190 2/100,000  12/10/2021: Cycle 2 B cycle + rituximab   12/14/21: IT#4  12/17/21: IT#5  01/10/22: Cycle 3 A cycle + rituximab   01/11/22: IT #6  01/24/22: Bmbx - MRD-neg by PCR (p190)   02/17/22: C4 B cycle + ritux  02/18/22: IT#7  03/21/22: C5 A cycle + ritux (4th dose)  03/22/22: IT#8  04/29/22: Bmbx - MRD-neg by PCR (p190)   05/18/22: restart dasatinib  70mg  s/p neutropenia  05/26/22: Start maintenance vincristine  2mg , Prednisone  200mg  D1-5   06/02/22: IT #9  06/27/22: C2 Maintenance: vincristine  2mg , Prednisone   200mg  D1-5, HOLD dasatinib  due to neutropenia  07/04/22: RESTART dasatinib  70mg   07/21/22: IT #10  07/26/22: C3 Maintenance: vincristine  2mg , Prednisone  200mg  D1-5, dasatinib  70 mg  08/08/22: IT #11 - prophylaxis completed  08/23/22: C4 Maintenance: vincristine  2mg , Prednisone  200mg  D1-5, dasatinib  70 mg  09/20/22: C5 Maintenance: vincristine  2mg , Prednisone  200mg  D1-5, dasatinib  70 mg  10/04/22: Bone marrow biopsy - continued remission, MRD and BCR/ABL p190 negative  10/18/22: C6 Maintenance. ANC 0.4, HOLD dasatinib , continue vincristine  2mg  and Prednisone  200mg  D1-5  11/15/22: C7 Maintenance ANC 0.4. HOLD TKI, continue vincristine  2mg  and Prednisone  200mg  D1-5  ~11/30/22: Start ponatinib  15 mg daily  12/13/22: C8 Maintenance ANC 1.2. ponatinib  15mg , vincristine  2mg  D1 and Prednisone  200mg  D1-5  01/10/23: C9 Maintenance Anc 0.7. HOLD ponatinib , continue vincristine  2mg  D1 and Prednisone  200mg  D1-5  01/26/23: ANC 1.7, restart ponatinib  15 mg  02/08/23: C10 Maintenance. ANC is 1.4 - continue ponatinib   03/07/23: C11 Maintenance ANC is 1.1. continue ponatinib   04/06/23: C12 Maintenance  04/12/23: Bmbx - continued remission, MRD-negative, BCR/ABL undetectable  05/02/23: C13 Maintenance  05/30/23: C14 Maintenance  06/27/23: C15 Maintenance PB p210 2/100,000  07/23/23: C16 Maintenance PB p210 2/100,000  08/22/23: C17 Maintenance PB p210 4/100,000  09/19/23: C18 Maintenance PB p210 2/100,000  10/16/23: C19 Maintenance - HOLD prednisone  due to hyperglycemia  10/16/23: Bmbx - CR, MRD-neg by flow, p210 47/100,000   11/01/23: Increased ponatinib  to 30 mg daily  11/26/23: CNS relapse - IT chemo   11/30/23: IT chemo - blasts present   12/01/23: Bmbx - MRD+ disease, 0.14% by flow, p190 241/100,000   12/04/23: IT chemo - blasts present   12/07/23: IT chemo - blasts present   12/07/23: IT chemo - no definitive blasts - 1/8 clear   12/12/23: C1D1 Blinatumomab    12/11/23: IT chemo - no blasts - 2/8 clear  12/18/23: IT chemo - no blasts - 3/8 clear   12/27/23: IT chemo - no blasts - 4/8 clear   01/09/24: IT chemo - no blasts - 5/8 clear   01/15/24: Bmbx - MRD negative by flow and p190   01/24/24: IT chemo          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)      10/29/2021 Initial Diagnosis    B-cell acute lymphoblastic leukemia (ALL) (CMS-HCC)     10/30/2021 - 03/30/2022 Chemotherapy    IP/OP LEUKEMIA GRAAPH-2005 < 60 YO (OP PEGFILGRASTIM  ON DAY 7)  [No description for this plan]     01/06/2022 - 04/07/2022 Endocrine/Hormone Therapy    OP LEUPROLIDE  (LUPRON ) 11.25 MG EVERY 3 MONTHS  Plan Provider: Toribio JONELLE Bunker, MD     05/26/2022 -  Chemotherapy    OP LEUKEMIA VINCRISTINE   vinCRIStine  2 mg IV on day 1     07/26/2022 - 07/26/2022 Endocrine/Hormone Therapy    OP LEUPROLIDE  (LUPRON ) 11.25 MG EVERY 3 MONTHS  Plan Provider: Toribio JONELLE Bunker, MD           History of Present Illness  Patricia [name] is a 48 year old female with Philadelphia positive AML who presents for follow-up and consideration of starting cycle three of blinatumomab .    Philadelphia chromosome-positive acute myeloid leukemia (aml) disease status and treatment  - Currently in complete remission two (CR2) from Philadelphia positive AML  - Receiving second line therapy with blinatumomab , completed two cycles, under consideration for starting cycle three  - Blinatumomab  administered at 30 mg daily  - Experienced central nervous system (CNS) relapse on  November 26, 2023; cerebrospinal fluid (CSF) clear since Dec 11, 2023  - Receives intrathecal chemotherapy with lanadelumab twice per cycle, typically on days 29 and 36; most recent dose administered yesterday due to scheduling issues    Medication adherence and management  - Takes blinatumomab , lanadelumab, and ponatinib  as part of current regimen  - Ponatinib  taken daily with no missed doses in the last month except for one instance due to hospital order confusion  - Rosuvastatin  currently paused since prior to hospitalization due to concerns about new medications  - Aspirin  used to manage clotting risk associated with ponatinib     Gastrointestinal symptoms  - Experienced three loose bowel movements yesterday morning, suspected to be related to dietary changes  - No further gastrointestinal symptoms throughout the remainder of the day    Fatigue  - Experiences ongoing fatigue, attributed to treatment and recent weather conditions    Weight loss and physical activity  - Lost approximately 20 pounds since February 2025, attributed to increased activity and health management efforts  - Engages in regular physical activity, including gardening  - Focusing on strengthening quadriceps in preparation for potential bone marrow transplant    Comorbid conditions  - History of hypertension  - History of cytomegalovirus (CMV) viremia  - History of hypogammaglobulinemia  - History of external hemorrhoids with anal fissures      Past Medical History:  Past Medical History[1]    Medications:  Current Medications[2]    Vital Signs:  BSA: There is no height or weight on file to calculate BSA.  There were no vitals filed for this visit.      Physical Exam:  Objective:     ECOG Performance Status: 1 - Restricted in physically strenuous activity but ambulatory and able to carry out work of a light or sedentary nature, e.g., light house work, office work      Physical Exam  MEASUREMENTS: Weight- 85.7.  CHEST: Clear to auscultation bilaterally.  CARDIOVASCULAR: Tachycardic with regular rhythm.  No concerning findings on exam      Relevant Laboratory, radiology and pathology results:  Results  LABS  CSF: clear (12/11/2023)    PATHOLOGY  Bone marrow biopsy: Philadelphia chromosome-positive B-cell acute lymphoblastic leukemia (ALL) in complete remission, negative for BCR/ABL1 P190 (01/15/2024)    Recent Results (from the past 24 hours)   Comprehensive Metabolic Panel    Collection Time: 03/06/24 12:57 PM   Result Value Ref Range    Sodium 140 135 - 145 mmol/L    Potassium 3.9 3.4 - 4.8 mmol/L    Chloride 110 (H) 98 - 107 mmol/L    CO2 22.0 20.0 - 31.0 mmol/L    Anion Gap 8 5 - 14 mmol/L    BUN 11 9 - 23 mg/dL    Creatinine 9.47 (L) 0.55 - 1.02 mg/dL    BUN/Creatinine Ratio 21     eGFR CKD-EPI (2021) Female >90 >=60 mL/min/1.61m2    Glucose 217 (H) 70 - 179 mg/dL    Calcium  9.2 8.7 - 10.4 mg/dL    Albumin 3.9 3.4 - 5.0 g/dL    Total Protein 7.3 5.7 - 8.2 g/dL    Total Bilirubin 0.4 0.3 - 1.2 mg/dL    AST 28 <=65 U/L    ALT 23 10 - 49 U/L    Alkaline Phosphatase 72 46 - 116 U/L   Magnesium  Level    Collection Time: 03/06/24 12:57 PM   Result Value Ref Range    Magnesium  1.8 1.6 -  2.6 mg/dL   Phosphorus    Collection Time: 03/06/24 12:57 PM   Result Value Ref Range    Phosphorus 2.5 2.4 - 5.1 mg/dL   C-reactive protein    Collection Time: 03/06/24 12:57 PM   Result Value Ref Range    CRP 12.8 (H) <=10.0 mg/L   CBC w/ Differential    Collection Time: 03/06/24 12:57 PM   Result Value Ref Range    WBC 4.5 3.6 - 11.2 10*9/L    RBC 3.34 (L) 3.95 - 5.13 10*12/L    HGB 10.8 (L) 11.3 - 14.9 g/dL    HCT 68.7 (L) 65.9 - 44.0 %    MCV 93.5 77.6 - 95.7 fL    MCH 32.5 (H) 25.9 - 32.4 pg    MCHC 34.7 32.0 - 36.0 g/dL    RDW 84.2 (H) 87.7 - 15.2 %    MPV 10.2 6.8 - 10.7 fL    Platelet 99 (L) 150 - 450 10*9/L    Neutrophils % 62.0 %    Lymphocytes % 21.8 %    Monocytes % 11.3 %    Eosinophils % 4.3 %    Basophils % 0.6 %    Absolute Neutrophils 2.8 1.8 - 7.8 10*9/L    Absolute Lymphocytes 1.0 (L) 1.1 - 3.6 10*9/L    Absolute Monocytes 0.5 0.3 - 0.8 10*9/L    Absolute Eosinophils 0.2 0.0 - 0.5 10*9/L    Absolute Basophils 0.0 0.0 - 0.1 10*9/L   CSF protein    Collection Time: 03/06/24  3:41 PM   Result Value Ref Range    Protein, CSF 34 20 - 59 mg/dL   Glucose, CSF    Collection Time: 03/06/24  3:41 PM   Result Value Ref Range    Glucose, CSF 106 (H) 48 - 79 mg/dL   Hempath Leukemia Flowcytometry, CSF    Collection Time: 03/06/24  3:41 PM   Result Value Ref Range    Tube # CSF Tube 1     Color, CSF Colorless     Appearance, CSF Clear     Nucleated Cells, CSF 31 (H) <=5 ul    RBC, CSF <2 <2 ul    #Cells counted for Diff 100     Lymphs %, CSF 93.0 %    Mono/Macrophage %, CSF 7.0 %                              [1]   Past Medical History:  Diagnosis Date    Anxiety     B-cell acute lymphoblastic leukemia (ALL)    10/29/2021    Cytomegalovirus (CMV) viremia        Diabetes mellitus        Hypertension    [2]   Current Outpatient Medications   Medication Sig Dispense Refill    acetaminophen  (TYLENOL  8 HOUR) 650 MG CR tablet Take 2 tablets (1,300 mg total) by mouth every eight (8) hours as needed for pain.      albuterol  HFA 90 mcg/actuation inhaler Inhale 2 puffs every six (6) hours as needed for wheezing. 8 g 1    aspirin  (ECOTRIN) 81 MG tablet Take 1 tablet (81 mg total) by mouth daily.      blood sugar diagnostic (GLUCOSE BLOOD) Strp Use to check blood sugar two times a day. 100 each 0    blood-glucose meter kit Use to check blood sugar two times a day.  Use as instructed. 1 each 0    cetirizine  (ZYRTEC ) 10 MG tablet Take 1 tablet (10 mg total) by mouth nightly. 90 tablet 3    dapsone  100 MG tablet Take 1 tablet (100 mg total) by mouth daily. TAKE 1 TABLET(100 MG) BY MOUTH DAILY 30 tablet 11    escitalopram  oxalate (LEXAPRO ) 10 MG tablet Take 1 tablet (10 mg total) by mouth daily. 90 tablet 2    insulin  glargine (LANTUS  SOLOSTAR U-100 INSULIN ) 100 unit/mL (3 mL) injection pen Inject 0.22 mL (22 Units total) under the skin nightly.      lancets Misc Use to check blood sugar as directed 2 times a day & for symptoms of high or low blood sugar. 100 each 0    letermovir  (PREVYMIS ) 480 mg tablet Take 1 tablet (480 mg total) by mouth daily. 28 tablet 5    losartan  (COZAAR ) 50 MG tablet Take 1 tablet (50 mg total) by mouth in the morning.      PONATinib  (ICLUSIG ) 30 mg tablet Take 1 tablet (30 mg total) by mouth daily. Swallow tablets whole. Do not crush, break, cut or chew tablets. 30 tablet 5    [Paused] rosuvastatin  (CRESTOR ) 10 MG tablet Take 1 tablet (10 mg total) by mouth nightly. (Patient not taking: Reported on 01/31/2024) 90 tablet 3    valACYclovir  (VALTREX ) 500 MG tablet Take 1 tablet (500 mg total) by mouth daily. 30 tablet 12     No current facility-administered medications for this visit.     Facility-Administered Medications Ordered in Other Visits   Medication Dose Route Frequency Provider Last Rate Last Admin    dexAMETHasone  (DECADRON ) 4 mg/mL injection 20 mg  20 mg Intravenous Once PRN Estelle Toribio SAUNDERS, MD        diphenhydrAMINE  (BENADRYL ) injection 25 mg  25 mg Intravenous Once PRN Estelle Toribio SAUNDERS, MD        EPINEPHrine [facility]) injection 0.3 mg  0.3 mg Intramuscular Once PRN Estelle Toribio SAUNDERS, MD        famotidine  (PF) (PEPCID ) injection 20 mg  20 mg Intravenous Once PRN Estelle Toribio SAUNDERS, MD        methylPREDNISolone  sodium succinate  (SOLU-Medrol ) injection 125 mg  125 mg Intravenous Once PRN Estelle Toribio SAUNDERS, MD        OKAY TO SEND MEDICATION/CHEMOTHERAPY TO OUTPATIENT UNIT   Other Once Estelle Toribio SAUNDERS, MD        sodium chloride  0.9% (NS) bolus 1,000 mL  1,000 mL Intravenous Once PRN Estelle Toribio SAUNDERS, MD

## 2024-03-06 NOTE — Unmapped (Signed)
 Lumbar Puncture Procedure Note  Name: Patricia [name]  Date: 03/06/2024  MRN: [id number]  DOB: [birth year]    Diagnosis: B-ALL    Indications: scheduled IT chemotherapy    Premed: Versed  2mg  IV x 1 and tramadol  50 mg for post-procedure back pain    Pre-procedural Planning      Contraindications to performing lumbar puncture: none     Imaging prior to lumbar puncture: Generally, imaging should be obtained prior to performing a lumbar puncture if the patient is > 65 years old, immunocompromised, has had a seizure within one week of presentation, has an abnormal level of consciousness, or an abnormal neurologic exam (including papilledema).      Antiplatelet Agents: This patient is not on an antiplatelet agent.     Systemic Anticoagulation: This patient is not on full systemic anticoagulation.     Significant Labs:  Plt =  99k         Consent: Informed consent was obtained. Risks of the procedure were discussed including: infection, bleeding, pain and headache.    A time-out in which Her patient identifiers were checked by 2 providers was performed.    Procedure Details      The patient was positioned under sterile conditions. Betadine solution and sterile drapes were utilized. Local anesthesia with 1% lidocaine  was applied subcutaneously then deep to the skin. A spinal needle was inserted at the L2 - L3 interspace. Spinal fluid was obtained and sent to the laboratory.    Intrathecal MTX was administered over 5 min.    The spinal needle with trocar was removed with minimal bleeding noted upon removal. A sterile bandage was placed over the puncture site after holding pressure.      Findings  6mL of clear spinal fluid was obtained.    Complications:  None; patient tolerated the procedure well.          Condition: stable    Plan  Bed rest for 1 hour.

## 2024-03-07 ENCOUNTER — Inpatient Hospital Stay: Admit: 2024-03-07 | Discharge: 2024-03-07 | Payer: PRIVATE HEALTH INSURANCE

## 2024-03-07 ENCOUNTER — Other Ambulatory Visit: Admit: 2024-03-07 | Discharge: 2024-03-07 | Payer: Medicaid (Managed Care)

## 2024-03-07 ENCOUNTER — Ambulatory Visit
Admit: 2024-03-07 | Discharge: 2024-03-07 | Payer: Medicaid (Managed Care) | Attending: Adult Health | Primary: Adult Health

## 2024-03-07 DIAGNOSIS — C9100 Acute lymphoblastic leukemia not having achieved remission: Principal | ICD-10-CM

## 2024-03-07 MED ADMIN — blinatumomab (BLINCYTO) 28 mcg/day in sodium chloride (non-PVC) 0.9 % IVPB (7-day OUTPATIENT CADD infusion): 28 ug/d | INTRAVENOUS | @ 16:00:00 | Stop: 2024-03-07

## 2024-03-07 MED ADMIN — dexAMETHasone (DECADRON) 4 mg/mL injection 16 mg: 16 mg | INTRAVENOUS | @ 15:00:00 | Stop: 2024-03-07

## 2024-03-07 MED FILL — PREVYMIS 480 MG TABLET: ORAL | 28 days supply | Qty: 28 | Fill #0

## 2024-03-07 MED FILL — ICLUSIG 30 MG TABLET: ORAL | 30 days supply | Qty: 30 | Fill #4

## 2024-03-07 NOTE — Unmapped (Signed)
 Outpatient BMT SW Note: SW met with pt following a clinic visit today. She reports that her mother is planning to come to be her first caregiver for her transplant recovery period. Her additional caregivers will plan to follow based on pt's schedule and need.    She reports she has also been told that she will need to stay a little closer for her recovery period and her family is going to assist her with that where needed, but she would like to stay at the [facility] for that time if they have availability for a suite.    SW reviewed the guidelines for a SECU stay.    SW also encouraged pt to reach out to her if she had any additional needs financial or otherwise in planning for transplant and she would be happy to explore resources where available.

## 2024-03-07 NOTE — Unmapped (Signed)
 RED ZONE Means: RED ZONE: Take action now!     You need to be seen right away  Symptoms are at a severe level of discomfort    Call 911 or go to your nearest  Hospital for help     - Bleeding that will not stop    - Hard to breathe    - New seizure - Chest pain  - Fall or passing out  -Thoughts of hurting    yourself or others      Call 911 if you are going into the RED ZONE                  YELLOW ZONE Means:     Please call with any new or worsening symptom(s), even if not on this list.  Call [phone]  After hours, weekends, and holidays - you will reach a long recording with specific instructions, If not in an emergency such as above, please listen closely all the way to the end and choose the option that relates to your need.   You can be seen by a provider the same day through our Same Day Acute Care for Patients with Cancer program.      YELLOW ZONE: Take action today     Symptoms are new or worsening  You are not within your goal range for:    - Pain    - Shortness of breath    - Bleeding (nose, urine, stool, wound)    - Feeling sick to your stomach and throwing up    - Mouth sores/pain in your mouth or throat    - Hard stool or very loose stools (increase in       ostomy output)    - No urine for 12 hours    - Feeding tube or other catheter/tube issue    - Redness or pain at previous IV or port/catheter site    - Depressed or anxiety   - Swelling (leg, arm, abdomen,     face, neck)  - Skin rash or skin changes  - Wound issues (redness, drainage,    re-opened)  - Confusion  - Vision changes  - Fever >100.4 F or chills  - Worsening cough with mucus that is    green, yellow, or bloody  - Pain or burning when going to the    bathroom  - Home Infusion Pump Issue- call    [phone]         Call your healthcare provider if you are going into the YELLOW ZONE     GREEN ZONE Means:  Your symptoms are under controls  Continue to take your medicine as ordered  Keep all visits to the provider GREEN ZONE: You are in control  No increase or worsening symptoms  Able to take your medicine  Able to drink and eat    - DO NOT use MyChart messages to report red or yellow symptoms. Allow up to 3    business days for a reply.  -MyChart is for non-urgent medication refills, scheduling requests, or other general questions.         [id number] Rev. 01/28/2022  Approved by Oncology Patient Education Committee     No visits with results within 1 Day(s) from this visit.   Latest known visit with results is:   Hospital Outpatient Visit on 03/06/2024   Component Date Value Ref Range Status    Protein, CSF 03/06/2024 34  20 - 59 mg/dL Final  Glucose, CSF 03/06/2024 106 (H)  48 - 79 mg/dL Final    Tube # CSF 91/93/7974 Tube 1   Final    Color, CSF 03/06/2024 Colorless   Final    Appearance, CSF 03/06/2024 Clear   Final    Nucleated Cells, CSF 03/06/2024 31 (H)  <=5 ul Final    RBC, CSF 03/06/2024 <2  <2 ul Final    #Cells counted for Diff 03/06/2024 100   Final    Lymphs %, CSF 03/06/2024 93.0  % Final    Mono/Macrophage %, CSF 03/06/2024 7.0  % Final    Diagnosis 03/06/2024    Final                    Value:A:  Cerebrospinal fluid, cytospin review   -  Pleocytosis with no blasts identified (see Comment)    This electronic signature is attestation that the pathologist personally reviewed the submitted material(s) and the final diagnosis reflects that evaluation.      Diagnosis Comment 03/06/2024    Final                    Value:There are too few nucleated cells of interest for informative flow cytometric analysis.      Clinical History 03/06/2024    Final                    Value:The patient is a 48 year old female with a history of Ph (+) B-lymphoblastic leukemia. Pathologist's review of the fluid cytospin slide and flow cytometry are requested.       Gross Description 03/06/2024    Final                    Value:Received: A single tube of cerebrospinal fluid. A Wright-Giemsa stained cytospin slide is received for review.  Flow cytometry is requested but is not performed due to the low nucleated cell count and /or cellular composition of the specimen.        Microscopic Description 03/06/2024    Final                    Value:Microscopic examination substantiates the above diagnosis.    The stained cytospin reveals mostly small, mature-appearing lymphoid cells with few admixed monocytes.    Resident Physician: None Assigned      Disclaimer 03/06/2024    Final                    Value:Unless otherwise specified, specimens are preserved using 10% neutral buffered formalin. For cases in which immunohistochemical and/or in-situ hybridization stains are performed, the following statement applies: Appropriate controls for each stain (positive controls with or without negative controls) have been evaluated and stain as expected. These stains have not been separately validated for use on decalcified specimens and should be interpreted with caution in that setting. Some of the reagents used for these stains may be classified as analyte specific reagents (ASR). Tests using ASRs were developed, and their performance characteristics were determined, by the Anatomic Pathology Department [facility]). They have not been cleared or approved by the [place]  Food and Drug Administration (FDA). The FDA does not require these tests to go through premarket FDA review. These tests are used for clinical purposes. They should not be regarded as investigational or for  research. This laboratory is certified under the Clinical Laboratory Improvement Amendments (CLIA) as qualified to perform high complexity clinical laboratory testing.

## 2024-03-07 NOTE — Unmapped (Addendum)
 Patient arrived to chair 5.  No complaints noted.  Right chest wall double intact with blood return. Labs drawn at this time and sent.Patient stated she had an office visit with Ludie Shaggy AGNP and he completed her CRS and informed her she didn't have to wait for lab results. Pre-medicated per treatment plan orders. CADD pump settings verified with 2nd nurse. Patient connected to 7 day blinatumumab home infusion pump, all connections checked. Patient infusing and discharged in stable condition.

## 2024-03-07 NOTE — Unmapped (Signed)
 Encounter addended by: Rebecca Latus, RN on: 03/07/2024 3:41 PM   Actions taken: Clinical Note Signed

## 2024-03-08 DIAGNOSIS — C9100 Acute lymphoblastic leukemia not having achieved remission: Principal | ICD-10-CM

## 2024-03-08 NOTE — Unmapped (Signed)
 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 We reviewed your treatment plan, including starting cycle three of blinatumomab  and the potential for an allogeneic stem cell transplant. We also addressed your medication adherence, gastrointestinal symptoms, fatigue, weight loss, and comorbid conditions.    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  -Start cycle three of blinatumomab  as an outpatient.  -Monitor for signs of CRS or neurotoxicity.  -Plan two lumbar punctures at the end of the cycle per protocol.  -Order peripheral blood test for BCR/ABL1 P190 level.  -Coordinate with transplant team for potential allogeneic stem cell transplant by end of September.  -Do not start cycle four of blinatumomab  if transplant is scheduled.  -Schedule follow-up visit with Dr. Estelle during the last week of the blinatumomab  cycle.    PLANNED ALLOGENEIC STEM CELL TRANSPLANT: We have identified six potential donors for your allogeneic stem cell transplant, which is planned for the end of September.  -Coordinate with transplant team for finalization of donor arrangements and transplant schedule.  -Do not start cycle four of blinatumomab  if transplant is scheduled for end of September.    PANCYTOPENIA RISK: There is a risk of pancytopenia due to your chemotherapy, and your current labs show a slight decrease in platelets and hemoglobin.  -Collect additional labs during the current cycle of blinatumomab .    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  -Administer IVIG on April 04, 2024, after completion of the lanadelumab cycle.

## 2024-03-11 DIAGNOSIS — C9100 Acute lymphoblastic leukemia not having achieved remission: Principal | ICD-10-CM

## 2024-03-12 DIAGNOSIS — C9100 Acute lymphoblastic leukemia not having achieved remission: Principal | ICD-10-CM

## 2024-03-14 ENCOUNTER — Inpatient Hospital Stay: Admit: 2024-03-14 | Discharge: 2024-03-14 | Payer: PRIVATE HEALTH INSURANCE

## 2024-03-14 DIAGNOSIS — E119 Type 2 diabetes mellitus without complications: Principal | ICD-10-CM

## 2024-03-14 DIAGNOSIS — C9100 Acute lymphoblastic leukemia not having achieved remission: Principal | ICD-10-CM

## 2024-03-14 DIAGNOSIS — Z794 Long term (current) use of insulin: Principal | ICD-10-CM

## 2024-03-14 LAB — CBC W/ AUTO DIFF
BASOPHILS ABSOLUTE COUNT: 0 10*9/L (ref 0.0–0.1)
BASOPHILS RELATIVE PERCENT: 0.9 %
EOSINOPHILS ABSOLUTE COUNT: 0.2 10*9/L (ref 0.0–0.5)
EOSINOPHILS RELATIVE PERCENT: 6.4 %
HEMATOCRIT: 32.7 % — ABNORMAL LOW (ref 34.0–44.0)
HEMOGLOBIN: 11.4 g/dL (ref 11.3–14.9)
LYMPHOCYTES ABSOLUTE COUNT: 1 10*9/L — ABNORMAL LOW (ref 1.1–3.6)
LYMPHOCYTES RELATIVE PERCENT: 26.8 %
MEAN CORPUSCULAR HEMOGLOBIN CONC: 34.7 g/dL (ref 32.0–36.0)
MEAN CORPUSCULAR HEMOGLOBIN: 32.8 pg — ABNORMAL HIGH (ref 25.9–32.4)
MEAN CORPUSCULAR VOLUME: 94.3 fL (ref 77.6–95.7)
MEAN PLATELET VOLUME: 10.5 fL (ref 6.8–10.7)
MONOCYTES ABSOLUTE COUNT: 0.4 10*9/L (ref 0.3–0.8)
MONOCYTES RELATIVE PERCENT: 9.9 %
NEUTROPHILS ABSOLUTE COUNT: 2.1 10*9/L (ref 1.8–7.8)
NEUTROPHILS RELATIVE PERCENT: 56 %
PLATELET COUNT: 105 10*9/L — ABNORMAL LOW (ref 150–450)
RED BLOOD CELL COUNT: 3.47 10*12/L — ABNORMAL LOW (ref 3.95–5.13)
RED CELL DISTRIBUTION WIDTH: 16.8 % — ABNORMAL HIGH (ref 12.2–15.2)
WBC ADJUSTED: 3.8 10*9/L (ref 3.6–11.2)

## 2024-03-14 LAB — COMPREHENSIVE METABOLIC PANEL
ALBUMIN: 4.1 g/dL (ref 3.4–5.0)
ALKALINE PHOSPHATASE: 72 U/L (ref 46–116)
ALT (SGPT): 29 U/L (ref 10–49)
ANION GAP: 12 mmol/L (ref 5–14)
AST (SGOT): 23 U/L (ref <=34)
BILIRUBIN TOTAL: 0.7 mg/dL (ref 0.3–1.2)
BLOOD UREA NITROGEN: 20 mg/dL (ref 9–23)
BUN / CREAT RATIO: 33
CALCIUM: 9.7 mg/dL (ref 8.7–10.4)
CHLORIDE: 101 mmol/L (ref 98–107)
CO2: 25 mmol/L (ref 20.0–31.0)
CREATININE: 0.61 mg/dL (ref 0.55–1.02)
EGFR CKD-EPI (2021) FEMALE: >90 mL/min/1.73m2 (ref >=60)
GLUCOSE RANDOM: 295 mg/dL — ABNORMAL HIGH (ref 70–179)
POTASSIUM: 4 mmol/L (ref 3.4–4.8)
PROTEIN TOTAL: 7.3 g/dL (ref 5.7–8.2)
SODIUM: 138 mmol/L (ref 135–145)

## 2024-03-14 LAB — MAGNESIUM: MAGNESIUM: 1.9 mg/dL (ref 1.6–2.6)

## 2024-03-14 LAB — PHOSPHORUS: PHOSPHORUS: 3.7 mg/dL (ref 2.4–5.1)

## 2024-03-14 MED ADMIN — blinatumomab (BLINCYTO) 28 mcg/day in sodium chloride (non-PVC) 0.9 % IVPB (7-day OUTPATIENT CADD infusion): 28 ug/d | INTRAVENOUS | @ 16:00:00 | Stop: 2024-03-14

## 2024-03-14 NOTE — Unmapped (Unsigned)
 Pt arrived to chair in Triage in outpatient oncology infusion for cycle 3 day 8 of treatment.     Pt previously accessed for home infusion. Today, Medication pulled from lateral port line.De-accessed, Lateral port, flushes easily w/ +blood return.     accessed via ***, with positive blood return. Access **** removed, +blood return. Treatment completed, no concerns.     Pt discharged ambulatory. AVS ***

## 2024-03-18 DIAGNOSIS — C9100 Acute lymphoblastic leukemia not having achieved remission: Principal | ICD-10-CM

## 2024-03-18 DIAGNOSIS — Z005 Encounter for examination of potential donor of organ and tissue: Principal | ICD-10-CM

## 2024-03-18 NOTE — Unmapped (Signed)
 Outpatient BMT SW Note: SW called pt to check in regarding FA updates and any other needs/questions prior to transplant.    Pt reports she has been told that she will need to reapply for FA assistance and she will be working on that in the coming weeks. Currently, she is worried that she will not be able to stay in [facility] for her transplant recovery period.    SW discussed other potential options for lodging as well as other potential programs that she may be able to use for financial assistance in paying for the lodging supports and offered to explore this further with pt in coming weeks.    Pt was very appreciative and offered to follow up with social worker closer to transplant to explore further.    SW will follow up.

## 2024-03-19 DIAGNOSIS — C9100 Acute lymphoblastic leukemia not having achieved remission: Principal | ICD-10-CM

## 2024-03-19 DIAGNOSIS — Z7682 Awaiting organ transplant status: Principal | ICD-10-CM

## 2024-03-19 NOTE — Unmapped (Addendum)
 [facility] BMT & CT PROGRAM  FAV REVIEW     [name]  Patient Age: 48 y.o.  Encounter Date: 03/20/2024    Primary Care Provider:  Estelle Toribio SAUNDERS, MD    Referring Physician:  Freddrick Smoke Per Patient  [address] [place],  [place] [address]    Cancer Diagnosis: Ph+ B-ALL; Initial Dx 10/29/2021  Cancer status: CR1, BCR/ABL 47/100,000 10/16/23  Treatment Regimen: First Line, ponatinib  + maintenance (vincristine , prednisone ) s/p truncated course of GRAAPH-2005, Hyper-CVAD, received 5 cycles. Then transitioned to maintenance. Progressed with CNS and BM relapse. Now in complete molecular remission on ponatinib  30 mg every day and blinatumomab .   Treatment Goal: Curative  Comorbidities: HTN, anxiety, hemorrhoids with anal fissures  Transplant: Referral deferred in CR1      The patient reports they are physically located in [place]  and is currently: not at home. I conducted a audio/video visit. I spent over 30 minutes on the video call with the patient. I spent an additional 60 minutes on pre- and post-visit activities on the date of service.      Patricia [name] is a 48 y.o. 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 She continues blinatumomab  and ponatinib .   HyperCVAD+dasatinib  was stopped s/p 5 cycles due to intolerance with multiple hospitalizations for infections. She received 11 doses of IT chemotherapy. Given her deep molecular remission and her intolerance of hyperCVAD+dasatinib , she was transitioned to maintenance chemotherapy, with pred+VCR+dasatinib  and later pred+VCR+ponatinib  15 mg.    In the Fall 2024 her p190 transcript become detectable at low level. The BMBx on 10/17/23 showed molecular relapse. The LP on 11/26/23 showed CNS molecular and morphologic relapse. The CNS disease cleared with a few doses of IT chemotherapy.   On 12/12/23 she started higher dose ponatinib  30 mg and blinatumomab .   To date she received 12 more doses of IT chemotherapy. She is in the midst of the third cycle of blinatumomab .   As above she remains in complete molecular remission.     ROS:   Patricia [name] reports doing quite well. c/o ongoing fatigue and weakness. Does get fatigued after walking 6-10 min then will rest and return to normal.  Smaller activities are fine. No assitive devices.   But can perform all her ADLs. She can do a lot of things such as home work tasks etc, but needs frequent rests.   She also naps regularly.    Denies fever chills or drenching night sweats.  Denies infections other than an episode of sinusitis for which she was treated just before starting her salvage.   She is eating and drinking well.   Denies N/V/D. Denies abdominal pain.  Has been losing weight.  Treatement made constipation worse, takes stool softeners prn.   Leg pain when active. Struggles to climb stairs.   Overall she has not been very active. She has never exercised. She has never received PT.  Neuropathy, mild, in both toes. Mostly numbness.   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 Follows with local therapist.  ROS was otherwise negative.     Hematology/Oncology History Overview Note   Referring/Local Oncologist:    Diagnosis:   10/29/2021  Bone marrow, left iliac, aspiration and biopsy  -  Hypercellular bone marrow (greater than 90%) involved by B lymphoblastic leukemia (~95%blasts by morphologic assessment of  aspirate smears and touch preps)  - Abnormal Karyotype:  47,XX,t(9;22)(q34;q11.2),+der(22)t(9;22)[18]/46,XX[2]     Abnormal FISH:  A BCR/ABL1 interphase FISH assay showed a signal pattern consistent with a BCR::ABL1 rearrangement and the 9;22 translocation in 88% of the 100 cells scored. The majority of the abnormal cells (64/88) contained an additional BCR/ABL1 fusion signal, while 9/88 abnormal cells contained an additional ABL1 and ASS1 signal.       Genetics:   Karyotype/FISH: RESULTS   Date Value Ref Range Status   10/28/2021   Final    NOTE: This report reflects a combined study from a peripheral blood and a bone marrow core biopsy. Eleven cells from the peripheral blood and nine cells from the bone marrow core biopsy were analyzed. The BCR/ABL1 FISH analysis was performed on the peripheral blood.     Abnormal Karyotype:  47,XX,t(9;22)(q34;q11.2),+der(22)t(9;22)[18]/46,XX[2]    Abnormal FISH:  A BCR/ABL1 interphase FISH assay showed a signal pattern consistent with a BCR::ABL1 rearrangement and the 9;22 translocation in 88% of the 100 cells scored. The majority of the abnormal cells (64/88) contained an additional BCR/ABL1 fusion signal, while 9/88 abnormal cells contained an additional ABL1 and ASS1 signal.           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  MRD by flow insufficient, p190 2/100,000  12/10/2021: Cycle 2 B cycle + rituximab   12/14/21: IT#4  12/17/21: IT#5  01/10/22: Cycle 3 A cycle + rituximab   01/11/22: IT #6  01/24/22: Bmbx - MRD-neg by PCR (p190)   02/17/22: C4 B cycle + ritux  02/18/22: IT#7  03/21/22: C5 A cycle + ritux (4th dose)  03/22/22: IT#8  04/29/22: Bmbx - MRD-neg by PCR (p190)   05/18/22: restart dasatinib  70mg  s/p neutropenia  05/26/22: Start maintenance vincristine  2mg , Prednisone  200mg  D1-5   06/02/22: IT #9  06/27/22: C2 Maintenance: vincristine  2mg , Prednisone  200mg  D1-5, HOLD dasatinib  due to neutropenia  07/04/22: RESTART dasatinib  70mg   07/21/22: IT #10  07/26/22: C3 Maintenance: vincristine  2mg , Prednisone  200mg  D1-5, dasatinib  70 mg  08/08/22: IT #11 - prophylaxis completed  08/23/22: C4 Maintenance: vincristine  2mg , Prednisone  200mg  D1-5, dasatinib  70 mg  09/20/22: C5 Maintenance: vincristine  2mg , Prednisone  200mg  D1-5, dasatinib  70 mg  10/04/22: Bone marrow biopsy - continued remission, MRD and BCR/ABL p190 negative  10/18/22: C6 Maintenance. ANC 0.4, HOLD dasatinib , continue vincristine  2mg  and Prednisone  200mg  D1-5  11/15/22: C7 Maintenance ANC 0.4. HOLD TKI, continue vincristine  2mg  and Prednisone  200mg  D1-5  ~11/30/22: Start ponatinib  15 mg daily  12/13/22: C8 Maintenance ANC 1.2. ponatinib  15mg , vincristine  2mg  D1 and Prednisone  200mg  D1-5  01/10/23: C9 Maintenance Anc 0.7. HOLD ponatinib , continue vincristine  2mg  D1 and Prednisone  200mg  D1-5  01/26/23: ANC 1.7, restart ponatinib  15 mg  02/08/23: C10 Maintenance. ANC is 1.4 - continue ponatinib   03/07/23: C11 Maintenance ANC is 1.1. continue ponatinib   04/06/23: C12 Maintenance  04/12/23: Bmbx - continued remission, MRD-negative, BCR/ABL undetectable  05/02/23: C13 Maintenance  05/30/23: C14 Maintenance  06/27/23: C15 Maintenance PB p210 2/100,000  07/23/23: C16 Maintenance PB p210 2/100,000  08/22/23: C17 Maintenance PB p210 4/100,000  09/19/23: C18 Maintenance PB p210 2/100,000  10/16/23: C19 Maintenance - HOLD prednisone  due to  hyperglycemia  10/16/23: Bmbx - CR, MRD-neg by flow, p210 47/100,000   11/01/23: Increased ponatinib  to 30 mg daily  11/26/23: CNS relapse - IT chemo   11/30/23: IT chemo - blasts present   12/01/23: Bmbx - MRD+ disease, 0.14% by flow, p190 241/100,000   12/04/23: IT chemo - blasts present   12/07/23: IT chemo - blasts present   12/07/23: IT chemo - no definitive blasts - 1/8 clear   12/12/23: C1D1 Blinatumomab    12/11/23: IT chemo - no blasts - 2/8 clear  12/18/23: IT chemo - no blasts - 3/8 clear   12/27/23: IT chemo - no blasts - 4/8 clear   01/09/24: IT chemo - no blasts - 5/8 clear   01/15/24: Bmbx - MRD negative by flow and p190   01/24/24: IT chemo   01/25/24: Cycle 2 blinatumomab  + ponatinib  30mg  daily  02/22/24: IT chemo  03/06/24: IT chemo  03/07/24: Cycle 3 blinatumomab  + ponatinib  30 mg daily         Allergies:  Allergies   Allergen Reactions Erythromycin Hives     Other reaction(s): Not available    Other      Pt cant take ibuprofen due to condition.    Sulfa (Sulfonamide Antibiotics) Anaphylaxis     Other reaction(s): Not available    Azithromycin Hives     Per patient report     Medications:     Current Outpatient Medications:     acetaminophen  (TYLENOL  8 HOUR) 650 MG CR tablet, Take 2 tablets (1,300 mg total) by mouth every eight (8) hours as needed for pain., Disp: , Rfl:     albuterol  HFA 90 mcg/actuation inhaler, Inhale 2 puffs every six (6) hours as needed for wheezing., Disp: 8 g, Rfl: 1    aspirin  (ECOTRIN) 81 MG tablet, Take 1 tablet (81 mg total) by mouth daily., Disp: , Rfl:     blood sugar diagnostic (GLUCOSE BLOOD) Strp, Use to check blood sugar two times a day., Disp: 100 each, Rfl: 0    blood-glucose meter kit, Use to check blood sugar two times a day. Use as instructed., Disp: 1 each, Rfl: 0    cetirizine  (ZYRTEC ) 10 MG tablet, Take 1 tablet (10 mg total) by mouth nightly., Disp: 90 tablet, Rfl: 3    dapsone  100 MG tablet, Take 1 tablet (100 mg total) by mouth daily. TAKE 1 TABLET(100 MG) BY MOUTH DAILY, Disp: 30 tablet, Rfl: 11    escitalopram  oxalate (LEXAPRO ) 10 MG tablet, Take 1 tablet (10 mg total) by mouth daily., Disp: 90 tablet, Rfl: 2    insulin  glargine (LANTUS  SOLOSTAR U-100 INSULIN ) 100 unit/mL (3 mL) injection pen, Inject 0.22 mL (22 Units total) under the skin nightly., Disp: , Rfl:     lancets Misc, Use to check blood sugar as directed 2 times a day & for symptoms of high or low blood sugar., Disp: 100 each, Rfl: 0    letermovir  (PREVYMIS ) 480 mg tablet, Take 1 tablet (480 mg total) by mouth daily., Disp: 28 tablet, Rfl: 5    losartan  (COZAAR ) 50 MG tablet, Take 1 tablet (50 mg total) by mouth in the morning., Disp: , Rfl:     PONATinib  (ICLUSIG ) 30 mg tablet, Take 1 tablet (30 mg total) by mouth daily. Swallow tablets whole. Do not crush, break, cut or chew tablets., Disp: 30 tablet, Rfl: 5    [Paused] rosuvastatin  (CRESTOR ) 10 MG tablet, Take 1 tablet (10 mg total) by mouth nightly. (Patient not  taking: Reported on 01/31/2024), Disp: 90 tablet, Rfl: 3    valACYclovir  (VALTREX ) 500 MG tablet, Take 1 tablet (500 mg total) by mouth daily., Disp: 30 tablet, Rfl: 12    Past Medical History:   Diagnosis Date    Anxiety     B-cell acute lymphoblastic leukemia (ALL)     10/29/2021    Cytomegalovirus (CMV) viremia         Diabetes mellitus         Hypertension    DM diagnosed in March   Type 2 DM. Has been on insulin  since May. Wears the Libre glucose sensors. Blood sugars; fluctuant 250 at night. Has Endocrinologist locally.   Asthma in childhood     Past Surgical History: None.    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   She is now disabled.  Got married in 2023. Lives with her husband, who is very supportive.    They have no children. 2 dogs.  ETOH, socially, small amounts.  Smoking on-off for 20+ years, 1/2 pack/day. Quit 6-7 years ago.  Denies use of illicit drugs.     Siblings: 22 yo sister. Same parents. She has Hashimoto thyroiditis amd various autoimmune disorders.   Parents 4 and 42 yo.     Family History:  Cousin with leukemia in 70-ties. Exposure to carcinogens in [employer].      Objective:   There were no vitals taken for this visit. Virtual visit.   Patient looks well. Converses comfortably.     Diagnosis: Ph + ALL  MALILLANY KAZLAUSKAS [id number] 48 y.o. May 02, 1976   BMT MD/APP:  Emberlee Sortino/Zanter      FAV completion date: 02/22/24  Disease status: Ph + ALLwith CNS involvement in CR MRD Negative currently receiving Ponatinib  and Blinatumomab       Transplant plan and donor information:  Tx type: MUD Match: 10/10 NMDP donors with CT pending. No related donors.   Conditioning: TBI/Cy Recipient: Blood Type: O+;CMV Status: positive        Comorbidity Index Score:   aaHCT-CI Score=6  80, Normal activity with effort; some signs or symptoms of disease (ECOG equivalent 1)   (1) Arrhythmia  af/afib   (1) Cardiac X cad/chf/mi/ef<50   (1) CVA  tia/cva   (1) Diabetes X treatment   (3) Heart Valve     (1) Hepa Mild  bili<1.5/ast<2.5   (3)Hepa Severe  bili>1.5/ast>2.5   (1) Infection X abx s/p day 0   (1) Inflam Bowel Disease     (1) Obesity  BMI > 35   (2) Peptic ulcer  treatment   (1) Psych X treatment   (2) Pulm mod X Dlco and/or FEV1 66-80% or dyspnea on slight activity   (3) Pulm severe  dlco <65 or dyspnea at rest or requiring oxygen   (2) Renal  CR>2   (2) Rheum  SLE/RA   (3) Solid tumor  treat xskin   Other Co-morbid     Age 17 and  Greater X     Timed Up and Go Test: 7.2 seconds     6 Minute Walk Test   Distance walked: 1299 feet/396 meters  Oxygen saturation pre-walk/post-walk: 95/95%    Heart rate pre-walk/post-walk: 84/88 bpm Max HR: 132 bpm  Cardiovascular   ECHO: EF 45-50% borderline. There is grade I diastolic dysfunction  The LV global longitudinal strain tracking is suboptimal to accurately report the GLS measurement.     EKG:NSR, prolonged QTc  Cardiology consult: ?  Pulmonary   PFTs: FVC-84%; FEV1-79%, DLCOc ([place])-67.49%  CXR: WNL  Caregiver plan/Concerns   Primary: Devaughn Ranger, patient's husband. Working fulltime so Engineer, production.      Back up:   For daytime and PRN  Alexis Colarusso, friend   Alicia Chistofferson, friend   Zhuri Krass, patient's mother (lives in [place] but retired and flexible)     TERS score: 34.5  Social concerns: Per SW - She has a history of depression/anxiety and undiagnosed ADHD. She takes Lexapro  for the anxiety/depression and she has access to a therapist she meets with when she needs to. Her anxiety scores were a little high  so she is planning to reach out to her local counselor instead of getting connected to [facility] for more supports.    She lives about 50 minutes away [facility]) wants to return home. Has a friend that lives closer to [facility] if needed for recovery period. No travel/lodging benefit.  Complex case: No    Other concerns/referrals/consults needed:   T2DM:Takes Lantus  and SSI at home.   HTN - HLD: Losartan . She self-discontinued Rosuvastatin .   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 Continues on Letermovir . Undetectable PCRs  Anxiety/Depression: takes Lexapro , therapy as above    Dental: No     Assessment and Plan:   Ms. 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 She is now being treated with blinatumomab , ponatinib  30 mg every day and IT chemotherapy. She remains in complete molecular remission.     **Ph(+)ALL. Associated with p190. CR2. Complete molecular remission by p190. But not checked by immunoglobulin MRD.   Immunoglobulin MRD PB positive at the time of relapse on 12/01/23.   To date received 23 doses of IT chemotherapy. I am not sure if she will tolerate any additional IT chemotherapy after transplant. Max 7 doses.   Will need BMBx and LP for disease evaluation prior to allo-SCT.   MRD assessment by PCR for BCR-ABL and immunoglobulin prior to transplant.   Consider cranial XRT after allo-SCT.   Consider IT chemotherapy after allo-SCT.   Consider TKI maintenance after allo-SCT. Will see if we can perform BCR-ABL1 mutation analysis on the sample collected at the time of relapse.     **aaHCT-CI 6. High risk. Estimated 2 yr TRM 40%.     **Donor search: multiple 10/10 NMDP donors available.   Family donors not suitable donors due to illnesses.     **Preparative regimen: Qol/[id number] with tac/MTX  Conditioning regimen will consist of:  1. Fludarabine 40 mg/m2 days -5, -4, -3, -2  2. Melphalan 100  mg/m2 day -1   GVHD prophylaxis. Regimen will consist of   1.Tacrolimus starting on day -3 (goal 5-10 ng/mL)  2. Methotrexate  5 mg/m2 IVP on days +1, +3, +6 and +11  3.  ATG per [place] standard dosing will not be included.    **Social support:   Patient's mother will be the primary caregiver for the first two months after discharge. She will later need to return to [place], where she lives.   Patient's husband helps but he works full time thus his availability is limited.   She has two friends who could be back up caregivers.   Thersia is retired and very flexible. However Lesta said that she had two small dogs and is not sure who would take care of the dogs if she needed to be with [place].   Bernarda has children and would not  be available until after Christmas and only for 1-2 weeks.   We discussed today that after discharge Brittnee will need to stay in [facility] until she is very stable. If her transplant course goes well, she may be able to move home early, before D+100. However, if complications, she may need to stay in [facility] much longer.    This is high risk transplant and we need to assure that she has adequate support before we proceed.     Co-morbidities:   T2DM: Chronic condition. Takes Lantus  and SSI at home.   HTN - HLD: Chronic conditions, taking Losartan  at home. She self-discontinued Rosuvastatin .   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 Continues on Letermovir . CMV PCR negative since mid March.  Anxiety/Depression: Chronic condition, takes Lexapro .   Hemorrhoids, Anal fissure: manages with topical meds.   Menses suppression and birth control: previously received Lupron  with last dose 07/26/22, has not had any menses since her last Lupron  dose  Hypogammaglobulinemia: receiving monthly IVIG.  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 Needs cardiology evaluation.    COPD: PFT show obstructive pattern as well as low DLCO.     We discussed all the above. Jazmaine expressed good understanding and agreement of the information provided and discussed today.   Outstanding issues are:  - Solidify caregiver plan;  - Cardiology consultation;  - She will need BMBx and LP for disease evaluation prior to transplant.       Kalai Baca J Elih Mooney, MD  [facility] Bone Marrow Transplant and Cellular Therapy Program

## 2024-03-20 ENCOUNTER — Encounter: Admit: 2024-03-20 | Discharge: 2024-03-21 | Payer: PRIVATE HEALTH INSURANCE

## 2024-03-20 DIAGNOSIS — C9100 Acute lymphoblastic leukemia not having achieved remission: Principal | ICD-10-CM

## 2024-03-21 ENCOUNTER — Inpatient Hospital Stay: Admit: 2024-03-21 | Discharge: 2024-03-22 | Payer: PRIVATE HEALTH INSURANCE

## 2024-03-21 LAB — HLA ANTIBODY SCREEN

## 2024-03-21 LAB — COMPREHENSIVE METABOLIC PANEL
ALBUMIN: 4 g/dL (ref 3.4–5.0)
ALKALINE PHOSPHATASE: 75 U/L (ref 46–116)
ALT (SGPT): 33 U/L (ref 10–49)
ANION GAP: 12 mmol/L (ref 5–14)
AST (SGOT): 31 U/L (ref <=34)
BILIRUBIN TOTAL: 0.5 mg/dL (ref 0.3–1.2)
BLOOD UREA NITROGEN: 14 mg/dL (ref 9–23)
BUN / CREAT RATIO: 26
CALCIUM: 9.1 mg/dL (ref 8.7–10.4)
CHLORIDE: 103 mmol/L (ref 98–107)
CO2: 23 mmol/L (ref 20.0–31.0)
CREATININE: 0.53 mg/dL — ABNORMAL LOW (ref 0.55–1.02)
EGFR CKD-EPI (2021) FEMALE: >90 mL/min/1.73m2 (ref >=60)
GLUCOSE RANDOM: 266 mg/dL — ABNORMAL HIGH (ref 70–179)
POTASSIUM: 4.1 mmol/L (ref 3.4–4.8)
PROTEIN TOTAL: 6.9 g/dL (ref 5.7–8.2)
SODIUM: 138 mmol/L (ref 135–145)

## 2024-03-21 LAB — CBC W/ AUTO DIFF
BASOPHILS ABSOLUTE COUNT: 0 10*9/L (ref 0.0–0.1)
BASOPHILS RELATIVE PERCENT: 1.1 %
EOSINOPHILS ABSOLUTE COUNT: 0.1 10*9/L (ref 0.0–0.5)
EOSINOPHILS RELATIVE PERCENT: 3.3 %
HEMATOCRIT: 31.9 % — ABNORMAL LOW (ref 34.0–44.0)
HEMOGLOBIN: 10.9 g/dL — ABNORMAL LOW (ref 11.3–14.9)
LYMPHOCYTES ABSOLUTE COUNT: 1.1 10*9/L (ref 1.1–3.6)
LYMPHOCYTES RELATIVE PERCENT: 24 %
MEAN CORPUSCULAR HEMOGLOBIN CONC: 34.2 g/dL (ref 32.0–36.0)
MEAN CORPUSCULAR HEMOGLOBIN: 32.7 pg — ABNORMAL HIGH (ref 25.9–32.4)
MEAN CORPUSCULAR VOLUME: 95.6 fL (ref 77.6–95.7)
MEAN PLATELET VOLUME: 10.6 fL (ref 6.8–10.7)
MONOCYTES ABSOLUTE COUNT: 0.5 10*9/L (ref 0.3–0.8)
MONOCYTES RELATIVE PERCENT: 10.3 %
NEUTROPHILS ABSOLUTE COUNT: 2.7 10*9/L (ref 1.8–7.8)
NEUTROPHILS RELATIVE PERCENT: 61.3 %
PLATELET COUNT: 135 10*9/L — ABNORMAL LOW (ref 150–450)
RED BLOOD CELL COUNT: 3.34 10*12/L — ABNORMAL LOW (ref 3.95–5.13)
RED CELL DISTRIBUTION WIDTH: 16.7 % — ABNORMAL HIGH (ref 12.2–15.2)
WBC ADJUSTED: 4.5 10*9/L (ref 3.6–11.2)

## 2024-03-21 LAB — MAGNESIUM: MAGNESIUM: 2.1 mg/dL (ref 1.6–2.6)

## 2024-03-21 LAB — C-REACTIVE PROTEIN: C-REACTIVE PROTEIN: <5 mg/L (ref <=10.0)

## 2024-03-21 LAB — PHOSPHORUS: PHOSPHORUS: 3.1 mg/dL (ref 2.4–5.1)

## 2024-03-21 MED ADMIN — blinatumomab (BLINCYTO) 28 mcg/day in sodium chloride (non-PVC) 0.9 % IVPB (7-day OUTPATIENT CADD infusion): 28 ug/d | INTRAVENOUS | @ 15:00:00 | Stop: 2024-03-21

## 2024-03-21 NOTE — Unmapped (Signed)
 Pt presents to the clinic, Chair 3 for the following???  Treatment: blinatumomab  28mcg/day CADD pump    Treatment day 15 cycle 3      Data:   vital signs stable, review of systems within parameters,   adverse drug reactions reviewed. IV access Right chest port.  Asberry Devona Sluder FNP performed CRS assessment & stated pt was okay to exchange blinatumomab  pump    Action:   BSA/agent double checked with another RN.  Medial lumen of port where CADD was infusing had issues drawing back blood. Eventually was able to get 1ml of blinatumomab  & 1 ml of blood out. Per Rosaline Rimes CPP due to blood return of 1 ml okay to flush & heparin  lock. Lateral lumen of port was accessed using sterile technique   Labs drawn, CHG dsg applied. CADD blinatumomab  pump exchanged    Response:  Pt tolerated without complaints.  No complications noted, positive blood return, no redness or edema at Right chest-wall port-a-cath  Right chest-wall port-a-cath was de-accessed & heparin  locked.  Site was CDI and free from s/sx of infection.    AVS was declined.  Pt was discharged in stable condition ambulatory.

## 2024-03-21 NOTE — Unmapped (Signed)
 Hospital Outpatient Visit on 03/21/2024   Component Date Value Ref Range Status    WBC 03/21/2024 4.5  3.6 - 11.2 10*9/L Final    RBC 03/21/2024 3.34 (L)  3.95 - 5.13 10*12/L Final    HGB 03/21/2024 10.9 (L)  11.3 - 14.9 g/dL Final    HCT 91/78/7974 31.9 (L)  34.0 - 44.0 % Final    MCV 03/21/2024 95.6  77.6 - 95.7 fL Final    MCH 03/21/2024 32.7 (H)  25.9 - 32.4 pg Final    MCHC 03/21/2024 34.2  32.0 - 36.0 g/dL Final    RDW 91/78/7974 16.7 (H)  12.2 - 15.2 % Final    MPV 03/21/2024 10.6  6.8 - 10.7 fL Final    Platelet 03/21/2024 135 (L)  150 - 450 10*9/L Final    Neutrophils % 03/21/2024 61.3  % Final    Lymphocytes % 03/21/2024 24.0  % Final    Monocytes % 03/21/2024 10.3  % Final    Eosinophils % 03/21/2024 3.3  % Final    Basophils % 03/21/2024 1.1  % Final    Absolute Neutrophils 03/21/2024 2.7  1.8 - 7.8 10*9/L Final    Absolute Lymphocytes 03/21/2024 1.1  1.1 - 3.6 10*9/L Final    Absolute Monocytes 03/21/2024 0.5  0.3 - 0.8 10*9/L Final    Absolute Eosinophils 03/21/2024 0.1  0.0 - 0.5 10*9/L Final    Absolute Basophils 03/21/2024 0.0  0.0 - 0.1 10*9/L Final    Anisocytosis 03/21/2024 Slight (A)  Not Present Final

## 2024-03-21 NOTE — Unmapped (Signed)
 OUTPATIENT ONCOLOGY PALLIATIVE CARE    Principal Diagnosis: Ms. Riordan is a 48 y.o. 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 Initially diagnosed in March 2023 and started on therapy on GRAAPH protocol. Recently in maintence when she presented with CNS relapse. BMBx shows MRD+ and therefore started on Blinatumomab , planning for bone marrow transplant.     Assessment/Plan:     #Headaches: Resolved. Presented to ED 10/2023 with worsening headaches, found to have CNS relapse of B-ALL, started on IT chemotherapy which improved  - Continue treatment of underlying B-ALL, planning for bone marrow transplant    #Anxiety/Coping Support: Up and down, but feels like she is coping well. Notices depressed days or anxious days, but works through it.   Uses excellent coping strategies. Briefly stopped lexapro  but then restarted when noticed worsening ability to cope and more frequent emotional breakdowns. Since restarting she is doing well on her current dose and reports no recent panic attacks. Does note some undesired sexual side effects that she is considering discussing with gyn alternative management approaches.   - Cont lexapro  10mg  daily   - Planing to reconnect with local therapist - had been working with him since brother died  - Continues to find fulfilling ways to give back to others     #Peripheral Neuropathy: Stable overall, comes and goes. 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   - discussed trial of capsaicin cream - patient has not tried but has on hand  - She also is working on an herbal salve   - could also roate from lexapro  to duloxetine in the future if more severe    #Hyperglycemia/DM: Worse. Switching to [facility] endocrinology today.  - Stopped taking metformin  due to side effects of muscle pain/tinnitus  - Insulin  per endocrine - she is hoping to get more education on how to better manage    #Cancer-Related Fatigue: Improving. Able to be more active, doing more houesehold work in prep for [employer].   - Encouraged light exercise as has not engaged in this as much lately  - Future Considerations: American Ginseng     #Cancer-Related Pain: Stable. HAs/Muscle aches intermittnetly. Responsive to tylenol , excedrine, and tizanidine  which she takes very intermittently.   - Cont tylenol  PRN  - Cont tizandine  PRN - has not been very helpful    #Advanced Care Planning:  Recent CNS relapse, has had very open discussions with oncology team and bone marrow transplant team. She is planning to proceed with bone marrow transplant next month.     Prior Convos:  - She and her husband have an excellent understanding of the current course of her treatment.  She recognizes that she is responding well and there is some uncertainty about how many rounds of treatment she will have to go through.  Has heard that the goal of treatment is curative, and she remains very hopeful for this.  Has been able to tolerate treatment fairly well to this point. Her major concern is about side effects from lumbar punctures.  Current focus is on cancer treatment and survival while maintaining quality of life is much as possible.    Health Care Decision Maker as of 03/21/2024    HCDM (patient stated preference): [name] Devaughn JASMINE Spouse - [phone]      # Controlled substances risk management.   - Patient does not have a signed pain medication agreement with our team.  No controlled substances being prescribed by our team currently.    - [place] database was reviewed today and it was appropriate.   -  Urine drug screen was not performed at this visit. Findings: not applicable.   - Patient has received information about safe storage and administration of medications.   - Patient has not received a prescription for narcan; is not applicable.       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    She is preparing for a bone marrow transplant scheduled for late September to early October. There are several promising donor matches, all under the age of 41. Her history of CMV and diabetes is being considered in her treatment plan.     She is currently receiving Blina (IT chemotherapy with lumbar punctures as part of her treatment) and is tolerating well. She has tolerated the lumbar punctures and chemotherapy well, with no significant side effects reported. No recent headaches.    She continues to have some struggles with blood sugar. She has an appointment with a a new, [place] endocrinologist to address her diabetes management - she is hoping for more insulin  education. She has been using fast-acting insulin  but has questions about adjusting her dosage. She suspects her diabetes may have been induced by a high dosage of prednisone , which she has not taken since March.    She is actively preparing her home for her upcoming hospitalization for BMT, including cleaning and organizing, to ensure her husband can manage the household in her absence. She has been experiencing fluctuations in energy levels, with some days of intense activity followed by rest days due to muscle soreness.    She has been managing her emotional health by allowing herself to experience and process feelings of depression and anxiety. She has had a recent emotional breakdown about upcoming BMT but generally maintains a positive outlook. She finds support in talking to others who have undergone similar medical experiences. Has good coping strategies and family support. Parents will be moving in when she undergoes BMT.      HPI: Ph+ ALL, currently in remission.    Current cancer-directed therapy: maintenance vincristine  2g, Prednisone  200mg  D1-4 +dasatinib        Palliative Performance Scale: 90% - Ambulation: Full / Normal Activity, some evidence of disease / Self-Care:Full / Intake: Normal / Level of Conscious: Full      Coping/Support Issues: She and her husband have multiple stressors but overall feels like she is coping fairly well right now.  She will reach out to our palliative care team if new concerns emerge.    Goals of Care:  Focus on cancer directed therapy and survival while hoping to maintain her quality of life is much as possible    Social History:   Name of primary support: Husband Devaughn  occupation: 6 grade teacher      Advance Care Planning:   HCPOA: Spouse  Natural surrogate decision maker: Husband [name]  Living Will: no  ACP note:     Objective       Hematology/Oncology History Overview Note   Referring/Local Oncologist:    Diagnosis:   10/29/2021  Bone marrow, left iliac, aspiration and biopsy  -  Hypercellular bone marrow (greater than 90%) involved by B lymphoblastic leukemia (~95%blasts by morphologic  assessment of aspirate smears and touch preps)  - Abnormal Karyotype:  47,XX,t(9;22)(q34;q11.2),+der(22)t(9;22)[18]/46,XX[2]     Abnormal FISH:  A BCR/ABL1 interphase FISH assay showed a signal pattern consistent with a BCR::ABL1 rearrangement and the 9;22 translocation in 88% of the 100 cells scored. The majority of the abnormal cells (64/88) contained an additional BCR/ABL1 fusion signal, while 9/88 abnormal cells contained an additional ABL1 and ASS1 signal.       Genetics:   Karyotype/FISH:   RESULTS   Date Value Ref Range Status   10/28/2021   Final    NOTE: This report reflects a combined study from a peripheral blood and a bone marrow core biopsy. Eleven cells from the peripheral blood and nine cells from the bone marrow core biopsy were analyzed. The BCR/ABL1 FISH analysis was performed on the peripheral blood.     Abnormal Karyotype:  47,XX,t(9;22)(q34;q11.2),+der(22)t(9;22)[18]/46,XX[2]    Abnormal FISH:  A BCR/ABL1 interphase FISH assay showed a signal pattern consistent with a BCR::ABL1 rearrangement and the 9;22 translocation in 88% of the 100 cells scored. The majority of the abnormal cells (64/88) contained an additional BCR/ABL1 fusion signal, while 9/88 abnormal cells contained an additional ABL1 and ASS1 signal.           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  MRD by flow insufficient, p190 2/100,000  12/10/2021: Cycle 2 B cycle + rituximab   12/14/21: IT#4  12/17/21: IT#5  01/10/22: Cycle 3 A cycle + rituximab   01/11/22: IT #6  01/24/22: Bmbx - MRD-neg by PCR (p190)   02/17/22: C4 B cycle + ritux  02/18/22: IT#7  03/21/22: C5 A cycle + ritux (4th dose)  03/22/22: IT#8  04/29/22: Bmbx - MRD-neg by PCR (p190)   05/18/22: restart dasatinib  70mg  s/p neutropenia  05/26/22: Start maintenance vincristine  2mg , Prednisone  200mg  D1-5   06/02/22: IT #9  06/27/22: C2 Maintenance: vincristine  2mg , Prednisone  200mg  D1-5, HOLD dasatinib  due to neutropenia  07/04/22: RESTART dasatinib  70mg   07/21/22: IT #10  07/26/22: C3 Maintenance: vincristine  2mg , Prednisone  200mg  D1-5, dasatinib  70 mg  08/08/22: IT #11 - prophylaxis completed  08/23/22: C4 Maintenance: vincristine  2mg , Prednisone  200mg  D1-5, dasatinib  70 mg  09/20/22: C5 Maintenance: vincristine  2mg , Prednisone  200mg  D1-5, dasatinib  70 mg  10/04/22: Bone marrow biopsy - continued remission, MRD and BCR/ABL p190 negative  10/18/22: C6 Maintenance. ANC 0.4, HOLD dasatinib , continue vincristine  2mg  and Prednisone  200mg  D1-5  11/15/22: C7 Maintenance ANC 0.4. HOLD TKI, continue vincristine  2mg  and Prednisone  200mg  D1-5  ~11/30/22: Start ponatinib  15 mg daily  12/13/22: C8 Maintenance ANC 1.2. ponatinib  15mg , vincristine  2mg  D1 and Prednisone  200mg  D1-5  01/10/23: C9 Maintenance Anc 0.7. HOLD ponatinib , continue vincristine  2mg  D1 and Prednisone  200mg  D1-5  01/26/23: ANC 1.7, restart ponatinib  15 mg  02/08/23: C10 Maintenance. ANC is 1.4 - continue ponatinib   03/07/23: C11 Maintenance ANC is 1.1. continue ponatinib   04/06/23: C12 Maintenance  04/12/23: Bmbx - continued remission, MRD-negative, BCR/ABL undetectable  05/02/23: C13 Maintenance  05/30/23: C14 Maintenance  06/27/23: C15 Maintenance PB p210 2/100,000  07/23/23: C16 Maintenance PB p210 2/100,000  08/22/23: C17 Maintenance PB p210 4/100,000  09/19/23: C18 Maintenance PB p210 2/100,000  10/16/23: C19 Maintenance -  HOLD prednisone  due to hyperglycemia  10/16/23: Bmbx - CR, MRD-neg by flow, p210 47/100,000   11/01/23: Increased ponatinib  to 30 mg daily  11/26/23: CNS relapse - IT chemo   11/30/23: IT chemo - blasts present   12/01/23: Bmbx - MRD+ disease, 0.14% by flow, p190 241/100,000   12/04/23: IT chemo - blasts present   12/07/23: IT chemo - blasts present   12/07/23: IT chemo - no definitive blasts - 1/8 clear   12/12/23: C1D1 Blinatumomab    12/11/23: IT chemo - no blasts - 2/8 clear  12/18/23: IT chemo - no blasts - 3/8 clear   12/27/23: IT chemo - no blasts - 4/8 clear   01/09/24: IT chemo - no blasts - 5/8 clear   01/15/24: Bmbx - MRD negative by flow and p190   01/24/24: IT chemo   01/25/24: Cycle 2 blinatumomab  + ponatinib  30mg  daily  02/22/24: IT chemo  03/06/24: IT chemo  03/07/24: Cycle 3 blinatumomab  + ponatinib  30 mg daily       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)       10/29/2021 Initial Diagnosis    B-cell acute lymphoblastic leukemia (ALL) (CMS-HCC)     10/30/2021 - 03/30/2022 Chemotherapy    IP/OP LEUKEMIA GRAAPH-2005 < 60 YO (OP PEGFILGRASTIM  ON DAY 7)  [No description for this plan]     01/06/2022 - 04/07/2022 Endocrine/Hormone Therapy    OP LEUPROLIDE  (LUPRON ) 11.25 MG EVERY 3 MONTHS  Plan Provider: Toribio JONELLE Bunker, MD     05/26/2022 -  Chemotherapy    OP LEUKEMIA VINCRISTINE   vinCRIStine  2 mg IV on day 1     07/26/2022 - 07/26/2022 Endocrine/Hormone Therapy    OP LEUPROLIDE  (LUPRON ) 11.25 MG EVERY 3 MONTHS  Plan Provider: Toribio JONELLE Bunker, MD           REVIEW OF SYSTEMS:  A comprehensive review of 10 systems was negative except for pertinent positives noted in HPI.      PHYSICAL EXAM: Video visit  There were no vitals filed for this visit.    GEN: Awake and alert, pleasant appearing female in no acute distress. Hair is growing back and in pig tails  LUNGS: No increased work of breathing on room air, talking in full sentences.  PSYCH: Alert and oriented to person, place and time. Excited when talking about her projects.  SKIN: No rashes, petechiae or jaundice noted        The patient reports they are physically located in [place]  and is currently: at home. I conducted a audio/video visit. I spent  23m 09s on the video call with the patient. I spent an additional 20 minutes on pre- and post-visit activities on the date of service .     Alyce Gola, MD  [facility]

## 2024-03-21 NOTE — Unmapped (Signed)
 Endocrinology Clinic Visit Note    ASSESSMENT AND PLAN:     Patricia [name] is a 48 y.o. female with a history significant for B-ALL dx 2023, HTN, anxiety who is seen in consultation today at the request of Alyce Debby Shaggy for recommendations regarding diabetes.    Type 2 diabetes mellitus 2/2 steroid induced hyperglycemia, controlled, with long-term current use of insulin    Goal A1c < 7% without hypoglycemia; most recent 5.2% 02/24/2024; today 5.0% - suspect falsely low given CGM with BG between 200-300. Would be explained by use of dapsone .   CGM linked to clinic. My interpretation is global hyperglycemia with prandial spikes. Difficult to interpret as patient reports difficulty with timing of insulin  boluses and varying doses based upon size of meal.   Current medications: Novolog  6-10 units, Lantus  hasn't taken for a week  Plans for bone marrow transplant in next 1-2 months  PLAN  Weight based insulin  dosing as follows:  Lantus  22 units every day - patient reports no overnight hypoglycemia on this dose.   Novolog  8 units TID AC with sliding scale 1:50 >150  Discussed with patient that depending on when/if she is given more steroids in the future (such as during SCT) or is under significant stress at time of transplant, insulin  needs may change.   Discussed hypoglycemia plan and treatment  Patient prescribed glucagon  today    Diabetes-Related Health Maintenance  Hypertension: goal <130/80; today 116/78  Hyperlipidemia: LDL 129 11/21/2023  Retinopathy: unknown, no visual insurance at this point. Will need ophtho follow up  Neuropathy: Last foot exam overdue  Nephropathy: Alb/Cr ratio overdue - perform after SCT  MASH:   FIB-4 Calculation: 1.91 at 03/14/2024 11:19 AM  Calculated from:  SGOT/AST: 23 U/L at 03/14/2024 11:19 AM  SGPT/ALT: 29 U/L at 03/14/2024 11:19 AM  Platelets: 105 10*9/L at 03/14/2024 11:19 AM  Age: 82 years    B-ALL  Active therapy with ponatinib  and blinatumomab    Plan for SCT in coming 1-2 months    Patient Instructions   Please refer to the following instructions for insulin :  Long-acting: 22 units once daily  Short-acting (Mealtime): 8 units three times daily before meals, remember to give 10-15 minutes before your meal  Correction factor: In addition to mealtime insulin  above, add 1 unit of short-acting insulin  for pre-meal blood sugars every 50 above 150, using the scale below:  70-150: no additional units  151-200: 1 unit  201-250: 2 units  251-300: 3 units  301-350: 4 units  350+: 5 units  For low blood sugars <70 (confirmed by fingerstick), use the Rule of 15 to treat it: give yourself 15 grams of carbs (glucose tablets or 4 oz juice), then check fingerstick blood sugar 15 minutes later. Repeat this until your blood sugar is >70.  You can purchase glucose tablets over-the-counter from your local pharmacy.  Make sure you have a supply of glucagon  at home and with you outside of the home. This is used for emergencies for someone else to give to you when you are too weak or confused to treat a low blood sugar. Please designate someone to give you glucagon  and show them where it is.    Follow up with CDE 2 mo, clinical pharmacy 3 mo, Storm 6 months    Patient was staffed with Dr. Eugenio.    Georgia Storm, MD   PGY4 Fellow in Endocrinology and Metabolism  [employer]       SUBJECTIVE:  History of Present Illness:  Patricia [name] is a 48 y.o. female who is seen in consultation today at the request of Alyce Debby Shaggy for evaluation of diabetes.    The patient has historically followed with [facility] for treatment of B-ALL. Treatment has included multiple lines of therapy, including rituximab , dasatinib , vincristine , prednisone , ponatinib , blinatumomab , and intrathecal chemotherapy. 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 Unfortunately progressed on therapy, experiencing CNS recurrence of disease. Now in molecular remission. Began to experience more frequent hyperglycemia in December 2024-January 2025. Current regimen is blinatumomab .     Planning for bone marrow transplant 04/2024. Still getting some steroids. Previously was seeing Endocrine close to home but difficulties coordinating appointments between that office and [facility].  She describes significant confusion regarding how to carb count, timing of insulin , and the proper regimen to be on at this point.  Her goal is to optimize her health status on her to receive a stem cell transplant, hopefully within the next 1 to 2 months.  She is attempted carb counting at home and has taught herself how to do this by watching YouTube videos and reading online.  She sometimes finds it difficult to do meal bolusing 15 to 30 minutes before meals.  Additionally, states that she has not taken her Lantus  in the last week to week and a half as she felt that the Lantus  only kept her blood glucose steady instead of bringing it down.    Exercise:  Household exercise    Blood sugar monitoring:  CGM  Lowest around 60s, asymptomatic    Type 2 diabetes mellitus history  Dx: 09/2023  Previously followed[facility]  Past treatments: metformin  500 mg every day, Novolog , Lantus   Hypoglycemia unawareness? Intact, reports hypoglycemia in past  DKA? 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     Medical History Surgical History   Past Medical History[1]   Past Surgical History[2]       Social History Family History   Social History     Tobacco Use    Smoking status: Former Current packs/day: 0.00     Types: Cigarettes     Quit date: 11/12/2015     Years since quitting: 8.3    Smokeless tobacco: Never   Substance Use Topics    Alcohol use: Not Currently      Family History[3]       Medications   Current Medications[4]         Allergies   Allergies[5]         OBJECTIVE:     Physical Exam:  BP 116/78 (BP Site: L Arm, BP Position: Sitting)  - Pulse 90  - Ht 168 cm (5' 6.14)  - Wt 83.9 kg (185 lb)  - BMI 29.73 kg/m??     Wt Readings from Last 5 Encounters:   03/22/24 83.9 kg (185 lb)   03/21/24 83.4 kg (183 lb 15.6 oz)   03/07/24 85.7 kg (188 lb 15 oz)   02/26/24 86.7 kg (191  lb 2.2 oz)   02/22/24 86.5 kg (190 lb 11.2 oz)        General: female in no apparent distress  HEENT: EOMI, sclera anicteric, no exophthalmos, no cushingoid features  Respiratory: No increased work of breathing on RA  Neuro: Awake, alert, and oriented  Psych: Normal mood and affect  Skin: No abnormal skin pigmentation    Labs:  I personally reviewed labs available in Epic prior to the start of today's visit.     Latest Reference Range & Units 11/15/22 09:24 10/17/23 09:35   Hemoglobin A1c 4.8 - 5.6 % <3.8 (L) 7.1 (H)   (L): Data is abnormally low  (H): Data is abnormally high  A1c 02/24/2024 5.2% via Care Everywhere     Latest Reference Range & Units 03/06/24 12:57 03/14/24 11:19 03/21/24 11:05   Glucose 70 - 179 mg/dL 782 (H) 704 (H) 733 (H)   (H): Data is abnormally high     Latest Reference Range & Units 11/21/23 13:35   Cholesterol, Total <200 mg/dL 769 (H)   Triglycerides <150 mg/dL 638 (H)   HDL >49 mg/dL 39 (L)   LDL calculated <100 mg/dL 870 (H)   Non-HDL Cholesterol <130 mg/dL 808 (H)   (H): Data is abnormally high  (L): Data is abnormally low     Latest Reference Range & Units 03/06/24 12:57 03/14/24 11:19   Creatinine 0.55 - 1.02 mg/dL 9.47 (L) 9.38   (L): Data is abnormally low    Imaging:  I personally reviewed imaging available in Epic prior to the start of today's visit.    I reviewed and summarized (above) records in preparation for today's visit all pertinent notes in Epic/Media and CareEverywhere as well as any sent records.           [1]   Past Medical History:  Diagnosis Date    Anxiety     B-cell acute lymphoblastic leukemia (ALL)     10/29/2021    Cytomegalovirus (CMV) viremia         Diabetes mellitus         Hypertension    [2]   Past Surgical History:  Procedure Laterality Date    BREAST CYST EXCISION Right     age 53    CHEMOTHERAPY      leukemia    IR INSERT PORT AGE GREATER THAN 5 YRS  12/08/2021    IR INSERT PORT AGE GREATER THAN 5 YRS 12/08/2021 Charmaine Arloa Provencal, PA IMG [facility]   [3] No family history on file.  [4]   Current Outpatient Medications:     acetaminophen  (TYLENOL  8 HOUR) 650 MG CR tablet, Take 2 tablets (1,300 mg total) by mouth every eight (8) hours as needed for pain., Disp: , Rfl:     albuterol  HFA 90 mcg/actuation inhaler, Inhale 2 puffs every six (6) hours as needed for wheezing., Disp: 8 g, Rfl: 1    aspirin  (ECOTRIN) 81 MG tablet, Take 1 tablet (81 mg total) by mouth daily., Disp: , Rfl:     blood sugar diagnostic (GLUCOSE BLOOD) Strp, Use to check blood sugar two times a day., Disp: 100 each, Rfl: 0    blood-glucose meter kit, Use to check blood sugar two times a day. Use as instructed., Disp: 1 each, Rfl: 0    blood-glucose sensor (FREESTYLE LIBRE 3 PLUS SENSOR) Devi, Use 1 sensor every 14 days; dispense Libre 3 Plus sensors, Disp: 6 each, Rfl: 3    cetirizine  (ZYRTEC ) 10  MG tablet, Take 1 tablet (10 mg total) by mouth nightly., Disp: 90 tablet, Rfl: 3    dapsone  100 MG tablet, Take 1 tablet (100 mg total) by mouth daily. TAKE 1 TABLET(100 MG) BY MOUTH DAILY, Disp: 30 tablet, Rfl: 11    escitalopram  oxalate (LEXAPRO ) 10 MG tablet, Take 1 tablet (10 mg total) by mouth daily., Disp: 90 tablet, Rfl: 2    glucagon  spray 3 mg/actuation Spry, Use 1 spray in 1 nostril for severe hypoglycemia, as per package instructions, Disp: 1 each, Rfl: 2    insulin  aspart (NOVOLOG  U-100 INSULIN  ASPART) 100 unit/mL vial, Inject 0.08 mL (8 Units total) under the skin Three (3) times a day before meals., Disp: 10 mL, Rfl: 3    insulin  glargine (LANTUS  SOLOSTAR U-100 INSULIN ) 100 unit/mL (3 mL) injection pen, Inject 0.22 mL (22 Units total) under the skin nightly., Disp: 3 mL, Rfl: 10    lancets Misc, Use to check blood sugar as directed 2 times a day & for symptoms of high or low blood sugar., Disp: 100 each, Rfl: 0    letermovir  (PREVYMIS ) 480 mg tablet, Take 1 tablet (480 mg total) by mouth daily., Disp: 28 tablet, Rfl: 5    losartan  (COZAAR ) 50 MG tablet, Take 1 tablet (50 mg total) by mouth in the morning., Disp: , Rfl:     PONATinib  (ICLUSIG ) 30 mg tablet, Take 1 tablet (30 mg total) by mouth daily. Swallow tablets whole. Do not crush, break, cut or chew tablets., Disp: 30 tablet, Rfl: 5    [Paused] rosuvastatin  (CRESTOR ) 10 MG tablet, Take 1 tablet (10 mg total) by mouth nightly. (Patient not taking: Reported on 01/31/2024), Disp: 90 tablet, Rfl: 3    valACYclovir  (VALTREX ) 500 MG tablet, Take 1 tablet (500 mg total) by mouth daily., Disp: 30 tablet, Rfl: 12  [5]   Allergies  Allergen Reactions    Erythromycin Hives     Other reaction(s): Not available    Other      Pt cant take ibuprofen due to condition.    Sulfa (Sulfonamide Antibiotics) Anaphylaxis     Other reaction(s): Not available    Azithromycin Hives     Per patient report

## 2024-03-21 NOTE — Unmapped (Signed)
 Vital signs:  Vitals:    03/21/24 0937   BP: 118/88   Pulse: 87   Resp: 18   Temp: 36.4 ??C (97.5 ??F)   TempSrc: Temporal   SpO2: 94%   Weight: 83.4 kg (183 lb 15.6 oz)     CRS:   Fever: No.  Rash: No.  Myalgias: No.  Hypotension: No.  Hypoxia: No.  Nausea and/or vomiting: No.     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 ???Close your eyes and stick out your tongue.??? 1/1 [employer] Ability to write a standard sentence (eg ???Our national bird is the Human resources officer??? 1/1 Point   Attention Ability to count backwards from 100 by 10 1/1 Point      Total 10/10 points     ICANS Grade: Grade 0, Full 10 points     Asberry Sluder, FNP

## 2024-03-22 ENCOUNTER — Ambulatory Visit: Admit: 2024-03-22 | Discharge: 2024-03-22 | Payer: PRIVATE HEALTH INSURANCE

## 2024-03-22 ENCOUNTER — Encounter
Admit: 2024-03-22 | Discharge: 2024-03-22 | Payer: PRIVATE HEALTH INSURANCE | Attending: Student in an Organized Health Care Education/Training Program | Primary: Student in an Organized Health Care Education/Training Program

## 2024-03-22 DIAGNOSIS — C9100 Acute lymphoblastic leukemia not having achieved remission: Principal | ICD-10-CM

## 2024-03-22 DIAGNOSIS — R530 Neoplastic (malignant) related fatigue: Principal | ICD-10-CM

## 2024-03-22 DIAGNOSIS — E1165 Type 2 diabetes mellitus with hyperglycemia: Principal | ICD-10-CM

## 2024-03-22 DIAGNOSIS — Z794 Long term (current) use of insulin: Principal | ICD-10-CM

## 2024-03-22 MED ORDER — INSULIN ASPART U-100  100 UNIT/ML SUBCUTANEOUS SOLUTION
Freq: Three times a day (TID) | SUBCUTANEOUS | 3 refills | 42.00000 days | Status: CP
Start: 2024-03-22 — End: 2025-03-22

## 2024-03-22 MED ORDER — FREESTYLE LIBRE 3 PLUS SENSOR DEVICE
ORAL | 3 refills | 0.00000 days | Status: CP
Start: 2024-03-22 — End: ?

## 2024-03-22 MED ORDER — BLOOD GLUCOSE TEST STRIPS
ORAL | 0 refills | 0.00000 days | Status: CP
Start: 2024-03-22 — End: ?

## 2024-03-22 MED ORDER — GLUCAGON 3 MG/ACTUATION NASAL SPRAY
NASAL | 2 refills | 0.00000 days | Status: CP
Start: 2024-03-22 — End: ?

## 2024-03-22 MED ORDER — LANCETS
0 refills | 0.00000 days | Status: CP
Start: 2024-03-22 — End: ?

## 2024-03-22 MED ORDER — LANTUS SOLOSTAR U-100 INSULIN 100 UNIT/ML (3 ML) SUBCUTANEOUS PEN
Freq: Every evening | SUBCUTANEOUS | 10 refills | 13.00000 days | Status: CP
Start: 2024-03-22 — End: ?

## 2024-03-22 NOTE — Unmapped (Signed)
 No Meter and pump downloaded. POC glucose and A1C done today. PP 9.15 am. 299 mg/dL.

## 2024-03-22 NOTE — Unmapped (Signed)
 Addended by: STORM HONER on: 03/22/2024 04:56 PM     Modules accepted: Level of Service

## 2024-03-22 NOTE — Unmapped (Addendum)
 Please refer to the following instructions for insulin :  Long-acting: 22 units once daily  Short-acting (Mealtime): 8 units three times daily before meals, remember to give 10-15 minutes before your meal  Correction factor: In addition to mealtime insulin  above, add 1 unit of short-acting insulin  for pre-meal blood sugars every 50 above 150, using the scale below:  70-150: no additional units  151-200: 1 unit  201-250: 2 units  251-300: 3 units  301-350: 4 units  350+: 5 units  For low blood sugars <70 (confirmed by fingerstick), use the Rule of 15 to treat it: give yourself 15 grams of carbs (glucose tablets or 4 oz juice), then check fingerstick blood sugar 15 minutes later. Repeat this until your blood sugar is >70.  You can purchase glucose tablets over-the-counter from your local pharmacy.  Make sure you have a supply of glucagon  at home and with you outside of the home. This is used for emergencies for someone else to give to you when you are too weak or confused to treat a low blood sugar. Please designate someone to give you glucagon  and show them where it is.

## 2024-03-22 NOTE — Unmapped (Signed)
 03/20/24    Present during phone visit with Dr. Jeaneen and patient on 03/20/24. FAV results and plan of care reviewed with patient.  Discussed disease status, transplant risks/benefits, and addressed questions/concerns.  Due to test results patient will need cardiology clearance (Appt 9/16) and will need a Chest CT.    Also reviewed post transplant recovery, lodging, and caregiver plan.  See Dr. Jaquita 03/20/24 note for details.  Patient will work with her caregivers further to ensure all needs can be met.      Total Time Spent: 40 mins  Time spent in direct patient care: 

## 2024-03-25 DIAGNOSIS — C9100 Acute lymphoblastic leukemia not having achieved remission: Principal | ICD-10-CM

## 2024-03-25 MED ORDER — DEXCOM G7 SENSOR DEVICE
3 refills | 0.00000 days | Status: CP
Start: 2024-03-25 — End: 2025-03-25

## 2024-03-25 MED ORDER — DEXCOM G7 RECEIVER
Freq: Once | 0 refills | 0.00000 days | Status: CP
Start: 2024-03-25 — End: 2024-03-25

## 2024-03-25 NOTE — Unmapped (Signed)
 PA approved for Dexcom G7 receiver and sensor via Epic.

## 2024-03-28 ENCOUNTER — Inpatient Hospital Stay: Admit: 2024-03-28 | Discharge: 2024-03-28 | Payer: PRIVATE HEALTH INSURANCE

## 2024-03-28 DIAGNOSIS — Z7682 Awaiting organ transplant status: Principal | ICD-10-CM

## 2024-03-28 DIAGNOSIS — C9100 Acute lymphoblastic leukemia not having achieved remission: Principal | ICD-10-CM

## 2024-03-28 LAB — CBC W/ AUTO DIFF
BASOPHILS ABSOLUTE COUNT: 0 10*9/L (ref 0.0–0.1)
BASOPHILS RELATIVE PERCENT: 1.1 %
EOSINOPHILS ABSOLUTE COUNT: 0.1 10*9/L (ref 0.0–0.5)
EOSINOPHILS RELATIVE PERCENT: 3.9 %
HEMATOCRIT: 31.1 % — ABNORMAL LOW (ref 34.0–44.0)
HEMOGLOBIN: 10.8 g/dL — ABNORMAL LOW (ref 11.3–14.9)
LYMPHOCYTES ABSOLUTE COUNT: 1.1 10*9/L (ref 1.1–3.6)
LYMPHOCYTES RELATIVE PERCENT: 34.7 %
MEAN CORPUSCULAR HEMOGLOBIN CONC: 34.6 g/dL (ref 32.0–36.0)
MEAN CORPUSCULAR HEMOGLOBIN: 33.4 pg — ABNORMAL HIGH (ref 25.9–32.4)
MEAN CORPUSCULAR VOLUME: 96.8 fL — ABNORMAL HIGH (ref 77.6–95.7)
MEAN PLATELET VOLUME: 9.9 fL (ref 6.8–10.7)
MONOCYTES ABSOLUTE COUNT: 0.4 10*9/L (ref 0.3–0.8)
MONOCYTES RELATIVE PERCENT: 13.2 %
NEUTROPHILS ABSOLUTE COUNT: 1.5 10*9/L — ABNORMAL LOW (ref 1.8–7.8)
NEUTROPHILS RELATIVE PERCENT: 47.1 %
PLATELET COUNT: 115 10*9/L — ABNORMAL LOW (ref 150–450)
RED BLOOD CELL COUNT: 3.22 10*12/L — ABNORMAL LOW (ref 3.95–5.13)
RED CELL DISTRIBUTION WIDTH: 16.8 % — ABNORMAL HIGH (ref 12.2–15.2)
WBC ADJUSTED: 3.2 10*9/L — ABNORMAL LOW (ref 3.6–11.2)

## 2024-03-28 LAB — COMPREHENSIVE METABOLIC PANEL
ALBUMIN: 3.8 g/dL (ref 3.4–5.0)
ALKALINE PHOSPHATASE: 64 U/L (ref 46–116)
ALT (SGPT): 20 U/L (ref 10–49)
ANION GAP: 13 mmol/L (ref 5–14)
AST (SGOT): 21 U/L (ref <=34)
BILIRUBIN TOTAL: 0.5 mg/dL (ref 0.3–1.2)
BLOOD UREA NITROGEN: 14 mg/dL (ref 9–23)
BUN / CREAT RATIO: 22
CALCIUM: 8.9 mg/dL (ref 8.7–10.4)
CHLORIDE: 108 mmol/L — ABNORMAL HIGH (ref 98–107)
CO2: 24 mmol/L (ref 20.0–31.0)
CREATININE: 0.63 mg/dL (ref 0.55–1.02)
EGFR CKD-EPI (2021) FEMALE: >90 mL/min/1.73m2 (ref >=60)
GLUCOSE RANDOM: 219 mg/dL — ABNORMAL HIGH (ref 70–179)
POTASSIUM: 3.9 mmol/L (ref 3.4–4.8)
PROTEIN TOTAL: 6.7 g/dL (ref 5.7–8.2)
SODIUM: 145 mmol/L (ref 135–145)

## 2024-03-28 LAB — PHOSPHORUS: PHOSPHORUS: 3.8 mg/dL (ref 2.4–5.1)

## 2024-03-28 LAB — MAGNESIUM: MAGNESIUM: 2 mg/dL (ref 1.6–2.6)

## 2024-03-28 MED ADMIN — blinatumomab (BLINCYTO) 28 mcg/day in sodium chloride (non-PVC) 0.9 % IVPB (7-day OUTPATIENT CADD infusion): 28 ug/d | INTRAVENOUS | @ 13:00:00 | Stop: 2024-03-28

## 2024-03-28 MED ADMIN — heparin, porcine (PF) 100 unit/mL injection 500 Units: 500 [IU] | INTRAVENOUS | @ 12:00:00 | Stop: 2024-03-29

## 2024-03-28 NOTE — Unmapped (Signed)
 RED ZONE Means: RED ZONE: Take action now!     You need to be seen right away  Symptoms are at a severe level of discomfort    Call 911 or go to your nearest  Hospital for help     - Bleeding that will not stop    - Hard to breathe    - New seizure - Chest pain  - Fall or passing out  -Thoughts of hurting    yourself or others      Call 911 if you are going into the RED ZONE                  YELLOW ZONE Means:     Please call with any new or worsening symptom(s), even if not on this list.  Call [phone]  After hours, weekends, and holidays - you will reach a long recording with specific instructions, If not in an emergency such as above, please listen closely all the way to the end and choose the option that relates to your need.   You can be seen by a provider the same day through our Same Day Acute Care for Patients with Cancer program.      YELLOW ZONE: Take action today     Symptoms are new or worsening  You are not within your goal range for:    - Pain    - Shortness of breath    - Bleeding (nose, urine, stool, wound)    - Feeling sick to your stomach and throwing up    - Mouth sores/pain in your mouth or throat    - Hard stool or very loose stools (increase in       ostomy output)    - No urine for 12 hours    - Feeding tube or other catheter/tube issue    - Redness or pain at previous IV or port/catheter site    - Depressed or anxiety   - Swelling (leg, arm, abdomen,     face, neck)  - Skin rash or skin changes  - Wound issues (redness, drainage,    re-opened)  - Confusion  - Vision changes  - Fever >100.4 F or chills  - Worsening cough with mucus that is    green, yellow, or bloody  - Pain or burning when going to the    bathroom  - Home Infusion Pump Issue- call    [phone]         Call your healthcare provider if you are going into the YELLOW ZONE     GREEN ZONE Means:  Your symptoms are under controls  Continue to take your medicine as ordered  Keep all visits to the provider GREEN ZONE: You are in control  No increase or worsening symptoms  Able to take your medicine  Able to drink and eat    - DO NOT use MyChart messages to report red or yellow symptoms. Allow up to 3    business days for a reply.  -MyChart is for non-urgent medication refills, scheduling requests, or other general questions.         [id number] Rev. 01/28/2022  Approved by Oncology Patient Education Committee

## 2024-03-28 NOTE — Unmapped (Signed)
 Teaching Physician Attestation:    I saw and evaluated the patient, participating in the key portions of the service.  I reviewed the resident???s note.  I agree with the resident???s findings and plan.     Carlean Jews, MD

## 2024-03-28 NOTE — Unmapped (Signed)
 Pt presents to the clinic, Chair 60 for the following???    Chemo:blinatumomab     Chemo day: C 3 D 22    Data: vital signs stable, review of systems within parameters, labs reviewed within parameters, or adverse drug reactions reviewed. IV access R CW DL port. Pt responds appropriately to questions & able to follow commands.    Action: BSA/agent double checked with another RN, positive blood return during administration through both lumens although lateral lumen is positional.  Lateral port de-accessed & new pump connected to medial lumen.    Response: tolerated without complaints, positive blood return, or no redness or edema at Right DL chest-wall port-a-cath, medial port w/ CADD pump infusing w/o difficulty. Lateral Right chest-wall port-a-cath was de-access and heparin  lock .  Site was CDI and free from s/sx of infection.  AVS was decline.  Pt was discharged in stable condition.  Pt ambulated independently off the unit alone.

## 2024-03-29 LAB — HLA CL I&II, HIGH RES

## 2024-04-02 DIAGNOSIS — C9100 Acute lymphoblastic leukemia not having achieved remission: Principal | ICD-10-CM

## 2024-04-02 NOTE — Unmapped (Signed)
 [facility] Refill Coordination Note    Specialty Medication(s) to be Shipped:   Hematology/Oncology: Iclusig  and Infectious Disease: Prevymis     Other medication(s) to be shipped: No additional medications requested for fill at this time    Specialty Medications not needed at this time: N/A     Patricia [name], DOB: 01/03/1976  Phone: [phone] (home)       All above HIPAA information was verified with patient.     Was a Nurse, learning disability used for this call? No    Completed refill call assessment today to schedule patient's medication shipment from the [facility]  [phone]).  All relevant notes have been reviewed.     Specialty medication(s) and dose(s) confirmed: Regimen is correct and unchanged.   Changes to medications: Patricia [name] reports no changes at this time.  Changes to insurance: No  New side effects reported not previously addressed with a pharmacist or physician: None reported  Questions for the pharmacist: No    Confirmed patient received a Conservation officer, historic buildings and a Surveyor, mining with first shipment. The patient will receive a drug information handout for each medication shipped and additional FDA Medication Guides as required.       DISEASE/MEDICATION-SPECIFIC INFORMATION        N/A    SPECIALTY MEDICATION ADHERENCE     Medication Adherence    Patient reported X missed doses in the last month: 0  Specialty Medication: Prevymis  480 mg  Patient is on additional specialty medications: Yes  Additional Specialty Medications: Iclusig  30 mg  Patient Reported Additional Medication X Missed Doses in the Last Month: 0  Patient is on more than two specialty medications: No  Informant: patient     Were doses missed due to medication being on hold? No    Iclusig  30 mg: 12 days of medicine on hand   Prevymis  480 mg: 12 days of medicine on hand       REFERRAL TO PHARMACIST     Referral to the pharmacist: Not needed      [facility]     Shipping address confirmed in Epic.     Cost and Payment: Patient has a $0 copay, payment information is not required.    Delivery Scheduled: Yes, Expected medication delivery date: 04/11/24.     Medication will be delivered via Next Day Courier to the prescription address in Epic [place].    Edgel Degnan M Santer Torres   [facility]  Specialty Technician

## 2024-04-02 NOTE — Unmapped (Signed)
 The patient reports they are physically located in [place]  and is currently: not at home(in her parked car). I conducted a audio/video visit. I spent  22m 19s on the video call with the patient. I spent an additional 20 minutes on pre- and post-visit activities on the date of service .      [facility] Follow-up    [name]  Patient Age: 48 y.o.  Encounter Date: 04/03/2024    Primary Care Provider:  Estelle Toribio SAUNDERS, MD    Referring Physician:  Iva Alyce Ned, AGNP  [address]  [id number]  [place],  [place] [address]      Cancer Diagnosis: Ph+ B-ALL; Initial Dx 10/29/2021  Cancer status: CR2, MRD negative, undetectable BCR/ABL <1/100,000 01/15/24  Treatment Regimen: Second Line, Blinatumumab (C1D1 5/13- )+ Ponatinib .                                      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 She is on second-line therapy with blinatumomab  and almost completing cycle 3. Was given outpatient because of lower risk of CRS after two prior cycles. The overall plan is to maintain remission and bridge to alloSCT. She reports the transplant team identified six potential donors for allogeneic stem cell transplant and that they may be ready to proceed in late September or early October.  - We plan to give C4 of blinatumomab  as an outpatient depending on timing of transplant.  - Monitor for signs of CRS or neurotoxicity.  - Plan two lumbar punctures at the end of the cycle per protocol.  - Order peripheral blood test for BCR/ABL1 P190 level prior to starting next cycle.  - Coordinate care with transplant team   - Do not start cycle four of blinatumomab  if transplant is scheduled.  - T continue with IT chemotherapy as planned    Pancytopenia risk secondary to chemotherapy for B-ALL  Risk of pancytopenia due to chemotherapy. likely related to blinatumomab .  - Continue to monitor blood counts    Hypogammaglobulinemia  Hypogammaglobulinemia managed with IVIG therapy. Next IVIG infusion scheduled for April 04, 2024.  - Administer IVIG on April 04, 2024, after completion of blinatumomab  cycle.    Type 2 diabetes mellitus  Managing diabetes with insulin , adjusting dosage. Difficulty determining appropriate insulin  dosage for meals. Collaborating with new endocrinologist to optimize management.  - Continue to adjust insulin  dosage as needed.  - Consult with endocrinologist for further guidance on insulin  management.    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   -09/2022 restarted Valcyte  with the goal of controlling CMV and limiting neutropenia  - continue letermovir   - d/c CMV levels as of 03/2023 - per Dr. Sheena  - 09/19/23 - CMV negative - checked due to elevated temps  Hemorrhoids, Anal fissure: persistent but stable, per patient  - supportive care at home     Menses suppression and birth control: previously received Lupron  with last dose 07/26/22   - patient stopped oral contraceptive due to weight loss. Counseled on importance of birth control on TKI. She is abstinent and we have previously discused making an appointment with GYN     Psychosocial distress: She reports a moderate level of anxiety regarding the management of the above.   - Counseling given   - Consider ref to comprehensive cancer support program      Patient-centered care/Shared decision-making:   We discussed the plan above at length. The patient and her husband actively contributed to the conversation. Specifically, her most important outcomes are:   -Prolonging overall survival and Maintaining her overall functional activity      Supportive Care Needs: We recommend based on the patient???s underlying diagnosis and treatment history the following supportive care:    RTC in 1 month with labs if transplant is still pending     1. Antimicrobial prophylaxis:    Viral - valacyclovir  and letermovir   Bacterial: Levofloxacin  when ANC <0.5  Fungal: Fluconazole  200mg  when ANC <0.5  PJP: dapsone  100mg  daily     2. Blood product support:  Leukoreduced blood products are required.  Irradiated blood products are preferred, but in case of urgent transfusion needs non-irradiated blood products may be used: 2 units for Hg <=8.0.      Lonni CHRISTELLA Bonier, MD   Fellow, hematology and oncology    Discussed with:    Dr Estelle    History of Present Illness:  Hematology/Oncology History Overview Note   Referring/Local Oncologist:    Diagnosis:   10/29/2021  Bone marrow, left iliac, aspiration and biopsy  -  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 The majority of the abnormal cells (64/88) contained an additional BCR/ABL1 fusion signal, while 9/88 abnormal cells contained an additional ABL1 and ASS1 signal.       Genetics:   Karyotype/FISH:   RESULTS   Date Value Ref Range Status   10/28/2021   Final    NOTE: This report reflects a combined study from a peripheral blood and a bone marrow core biopsy. Eleven cells from the peripheral blood and nine cells from the bone marrow core biopsy were analyzed. The BCR/ABL1 FISH analysis was performed on the peripheral blood.     Abnormal Karyotype:  47,XX,t(9;22)(q34;q11.2),+der(22)t(9;22)[18]/46,XX[2]    Abnormal FISH:  A BCR/ABL1 interphase FISH assay showed a signal pattern consistent with a BCR::ABL1 rearrangement and the 9;22 translocation in 88% of the 100 cells scored. The majority of the abnormal cells (64/88) contained an additional BCR/ABL1 fusion signal, while 9/88 abnormal cells contained an additional ABL1 and ASS1 signal.           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  MRD by flow insufficient, p190 2/100,000  12/10/2021: Cycle 2 B cycle + rituximab   12/14/21: IT#4  12/17/21: IT#5  01/10/22: Cycle 3 A cycle + rituximab   01/11/22: IT #6  01/24/22: Bmbx - MRD-neg by PCR (p190)   02/17/22: C4 B cycle + ritux  02/18/22: IT#7  03/21/22: C5 A cycle + ritux (4th dose)  03/22/22: IT#8  04/29/22: Bmbx - MRD-neg by PCR (p190)   05/18/22: restart dasatinib  70mg  s/p neutropenia  05/26/22: Start maintenance vincristine  2mg , Prednisone  200mg  D1-5   06/02/22: IT #9  06/27/22: C2 Maintenance: vincristine  2mg , Prednisone  200mg  D1-5, HOLD dasatinib  due to neutropenia  07/04/22: RESTART dasatinib  70mg   07/21/22: IT #10  07/26/22: C3 Maintenance: vincristine  2mg , Prednisone  200mg  D1-5, dasatinib  70 mg  08/08/22: IT #11 - prophylaxis completed  08/23/22: C4 Maintenance: vincristine  2mg , Prednisone  200mg  D1-5, dasatinib  70 mg  09/20/22: C5 Maintenance: vincristine  2mg , Prednisone  200mg  D1-5, dasatinib  70 mg  10/04/22: Bone marrow biopsy - continued remission, MRD and BCR/ABL p190 negative  10/18/22: C6 Maintenance. ANC 0.4, HOLD dasatinib , continue vincristine  2mg  and Prednisone  200mg  D1-5  11/15/22: C7 Maintenance ANC 0.4. HOLD TKI, continue vincristine  2mg  and Prednisone  200mg  D1-5  ~11/30/22: Start ponatinib  15 mg daily  12/13/22: C8 Maintenance ANC 1.2. ponatinib  15mg , vincristine  2mg  D1 and Prednisone  200mg  D1-5  01/10/23: C9 Maintenance Anc 0.7. HOLD ponatinib , continue vincristine  2mg  D1 and Prednisone  200mg  D1-5  01/26/23: ANC 1.7, restart ponatinib  15 mg  02/08/23: C10 Maintenance. ANC is 1.4 - continue ponatinib   03/07/23: C11 Maintenance ANC is 1.1. continue ponatinib   04/06/23: C12 Maintenance  04/12/23: Bmbx - continued remission, MRD-negative, BCR/ABL undetectable  05/02/23: C13 Maintenance  05/30/23: C14 Maintenance  06/27/23: C15 Maintenance PB p210 2/100,000  07/23/23: C16 Maintenance PB p210 2/100,000  08/22/23: C17 Maintenance PB p210 4/100,000  09/19/23: C18 Maintenance PB p210 2/100,000  10/16/23: C19 Maintenance - HOLD prednisone  due to hyperglycemia  10/16/23: Bmbx - CR, MRD-neg by flow, p210 47/100,000   11/01/23: Increased ponatinib  to 30 mg daily  11/26/23: CNS relapse - IT chemo   11/30/23: IT chemo - blasts present   12/01/23: Bmbx - MRD+ disease, 0.14% by flow, p190 241/100,000   12/04/23: IT chemo - blasts present   12/07/23: IT chemo - blasts present   12/07/23: IT chemo - no definitive blasts - 1/8 clear   12/12/23: C1D1 Blinatumomab    12/11/23: IT chemo - no blasts - 2/8 clear  12/18/23: IT chemo - no blasts - 3/8 clear   12/27/23: IT chemo - no blasts - 4/8 clear   01/09/24: IT chemo - no blasts - 5/8 clear   01/15/24: Bmbx - MRD negative by flow and p190   01/24/24: IT chemo   01/25/24: Cycle 2 blinatumomab  + ponatinib  30mg  daily  02/22/24: IT chemo  03/06/24: IT chemo  03/07/24: Cycle 3 blinatumomab  + ponatinib  30 mg daily       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)    (CMS-HCC)   10/29/2021 Initial Diagnosis    B-cell acute lymphoblastic leukemia (ALL) (CMS-HCC)     10/30/2021 - 03/30/2022 Chemotherapy    IP/OP LEUKEMIA GRAAPH-2005 < 60 YO (OP PEGFILGRASTIM  ON DAY 7)  [No description for this plan]     01/06/2022 - 04/07/2022 Endocrine/Hormone Therapy    OP LEUPROLIDE  (LUPRON ) 11.25 MG EVERY 3 MONTHS  Plan Provider: Toribio JONELLE Bunker, MD     05/26/2022 -  Chemotherapy    OP LEUKEMIA VINCRISTINE   vinCRIStine  2 mg IV on day 1  07/26/2022 - 07/26/2022 Endocrine/Hormone Therapy    OP LEUPROLIDE  (LUPRON ) 11.25 MG EVERY 3 MONTHS  Plan Provider: Toribio JONELLE Bunker, MD           History of Present Illness  Patricia [name] is a 48 year old female with Philadelphia positive AML who presents for follow-up and consideration of starting cycle three of blinatumomab .    Philadelphia chromosome-positive acute myeloid leukemia (aml) disease status and treatment  - Currently in complete remission two (CR2) from Philadelphia positive AML  - Receiving second line therapy with blinatumomab , completed two cycles, under consideration for starting cycle three  - Blinatumomab  administered at 30 mg daily  - Experienced central nervous system (CNS) relapse on November 26, 2023; cerebrospinal fluid (CSF) clear since Dec 11, 2023  - Receives intrathecal chemotherapy with lanadelumab twice per cycle, typically on days 29 and 36; most recent dose administered yesterday due to scheduling issues    Medication adherence and management  - Takes blinatumomab , lanadelumab, and ponatinib  as part of current regimen  - Ponatinib  taken daily with no missed doses in the last month except for one instance due to hospital order confusion  - Rosuvastatin  currently paused since prior to hospitalization due to concerns about new medications  - Aspirin  used to manage clotting risk associated with ponatinib     Gastrointestinal symptoms  - Experienced three loose bowel movements yesterday morning, suspected to be related to dietary changes  - No further gastrointestinal symptoms throughout the remainder of the day    Fatigue  - Experiences ongoing fatigue, attributed to treatment and recent weather conditions    Weight loss and physical activity  - Lost approximately 20 pounds since February 2025, attributed to increased activity and health management efforts  - Engages in regular physical activity, including gardening  - Focusing on strengthening quadriceps in preparation for potential bone marrow transplant    Comorbid conditions  - History of hypertension  - History of cytomegalovirus (CMV) viremia  - History of hypogammaglobulinemia  - History of external hemorrhoids with anal fissures    Interval history 04/03/2024    Patricia [name] is a 48 year old female with diabetes and a hematologic condition who presents for follow-up regarding her treatment and upcoming transplant.    She is currently undergoing treatment for a hematologic condition and is completing her third cycle of treatment tomorrow. She is in communication with the transplant team and anticipates a potential transplant date in the last week of September or the first week of October. She has an upcoming cardiology appointment on September 16th for further evaluation.    Her energy levels are described as 'pretty good', and she makes an effort to stay active, taking breaks as needed. Her appetite is influenced by her diabetes management, as she is cautious about her food intake to prevent blood sugar spikes. She is adjusting her insulin  dosage, initially set at six units per meal by her endocrinologist, but sometimes finds this excessive.    She experiences headaches, which she attributes to teeth clenching, and takes Tylenol  as needed, ensuring she does not have a fever beforehand. No fevers, chills, or diarrhea, but she mentions occasional constipation.    Recent lab results show a slightly low white blood cell count at 3.2, hemoglobin at 10.8, and an ANC of 1.5, which she notes has not been this low in over a year.    She has questions regarding the scheduling of lumbar punctures in relation to her treatment cycles and  upcoming transplant.      Past Medical History:  Past Medical History[1]    Medications:  Current Medications[2]    Vital Signs:  BSA: There is no height or weight on file to calculate BSA.  There were no vitals filed for this visit.      Physical Exam:  Objective:     ECOG Performance Status: 1 - Restricted in physically strenuous activity but ambulatory and able to carry out work of a light or sedentary nature, e.g., light house work, office work      Physical Exam  Well appearing on video and in no distress            Relevant Laboratory, radiology and pathology results:  Results  LABS  CSF: clear (12/11/2023)    PATHOLOGY  Bone marrow biopsy: Philadelphia chromosome-positive B-cell acute lymphoblastic leukemia (ALL) in complete remission, negative for BCR/ABL1 P190 (01/15/2024)      LABS  - White Blood Cell (WBC) count: 3.2 x 10^9/L  - Hemoglobin (Hb): 10.8 g/dL  - Absolute Neutrophil Count (ANC): 1.5 x 10^9/L    No results found for this or any previous visit (from the past 24 hours).                             [1]   Past Medical History:  Diagnosis Date    Anxiety     B-cell acute lymphoblastic leukemia (ALL)    (CMS-HCC) 10/29/2021    Cytomegalovirus (CMV) viremia    (CMS-HCC)     Diabetes mellitus    (CMS-HCC)     Hypertension    [2]   Current Outpatient Medications   Medication Sig Dispense Refill    acetaminophen  (TYLENOL  8 HOUR) 650 MG CR tablet Take 2 tablets (1,300 mg total) by mouth every eight (8) hours as needed for pain.      albuterol  HFA 90 mcg/actuation inhaler Inhale 2 puffs every six (6) hours as needed for wheezing. 8 g 1    aspirin  (ECOTRIN) 81 MG tablet Take 1 tablet (81 mg total) by mouth daily.      blood sugar diagnostic (GLUCOSE BLOOD) Strp Use to check blood sugar two times a day. 100 each 0    blood-glucose meter kit Use to check blood sugar two times a day. Use as instructed. 1 each 0    blood-glucose sensor (DEXCOM G7 SENSOR) Devi 1 each by Miscellaneous route every ten (10) days. 9 each 3    blood-glucose,receiver,cont (DEXCOM G7 RECEIVER) Misc 1 each by Miscellaneous route once for 1 dose. 1 each 0    cetirizine  (ZYRTEC ) 10 MG tablet Take 1 tablet (10 mg total) by mouth nightly. 90 tablet 3    dapsone  100 MG tablet Take 1 tablet (100 mg total) by mouth daily. TAKE 1 TABLET(100 MG) BY MOUTH DAILY 30 tablet 11    escitalopram  oxalate (LEXAPRO ) 10 MG tablet Take 1 tablet (10 mg total) by mouth daily. 90 tablet 2    glucagon  spray 3 mg/actuation Spry Use 1 spray in 1 nostril for severe hypoglycemia, as per package instructions 1 each 2    insulin  aspart (NOVOLOG  U-100 INSULIN  ASPART) 100 unit/mL vial Inject 0.08 mL (8 Units total) under the skin Three (3) times a day before meals. 10 mL 3    insulin  glargine (LANTUS  SOLOSTAR U-100 INSULIN ) 100 unit/mL (3 mL) injection pen Inject 0.22 mL (22 Units total) under the skin nightly. 3 mL  10    lancets Misc Use to check blood sugar as directed 2 times a day & for symptoms of high or low blood sugar. 100 each 0    letermovir  (PREVYMIS ) 480 mg tablet Take 1 tablet (480 mg total) by mouth daily. 28 tablet 5    losartan  (COZAAR ) 50 MG tablet Take 1 tablet (50 mg total) by mouth in the morning.      PONATinib  (ICLUSIG ) 30 mg tablet Take 1 tablet (30 mg total) by mouth daily. Swallow tablets whole. Do not crush, break, cut or chew tablets. 30 tablet 5    [Paused] rosuvastatin  (CRESTOR ) 10 MG tablet Take 1 tablet (10 mg total) by mouth nightly. (Patient not taking: Reported on 01/31/2024) 90 tablet 3    valACYclovir  (VALTREX ) 500 MG tablet Take 1 tablet (500 mg total) by mouth daily. 30 tablet 12     No current facility-administered medications for this visit.

## 2024-04-03 ENCOUNTER — Encounter
Admit: 2024-04-03 | Discharge: 2024-04-04 | Payer: PRIVATE HEALTH INSURANCE | Attending: Hematology | Primary: Hematology

## 2024-04-03 DIAGNOSIS — C9100 Acute lymphoblastic leukemia not having achieved remission: Principal | ICD-10-CM

## 2024-04-03 NOTE — Unmapped (Signed)
 Spoke to pt prior to video visit. No refills needed. Pharmacy confirmed. Pt to log in closer to appt time.

## 2024-04-04 ENCOUNTER — Inpatient Hospital Stay: Admit: 2024-04-04 | Discharge: 2024-04-05 | Payer: PRIVATE HEALTH INSURANCE

## 2024-04-04 ENCOUNTER — Ambulatory Visit: Admit: 2024-04-04 | Discharge: 2024-04-05 | Payer: PRIVATE HEALTH INSURANCE

## 2024-04-04 ENCOUNTER — Inpatient Hospital Stay
Admit: 2024-04-04 | Discharge: 2024-04-05 | Payer: PRIVATE HEALTH INSURANCE | Attending: Adult Health | Primary: Adult Health

## 2024-04-04 LAB — CBC W/ AUTO DIFF
BASOPHILS ABSOLUTE COUNT: 0 10*9/L (ref 0.0–0.1)
BASOPHILS RELATIVE PERCENT: 1 %
EOSINOPHILS ABSOLUTE COUNT: 0.1 10*9/L (ref 0.0–0.5)
EOSINOPHILS RELATIVE PERCENT: 3.6 %
HEMATOCRIT: 32.6 % — ABNORMAL LOW (ref 34.0–44.0)
HEMOGLOBIN: 11.1 g/dL — ABNORMAL LOW (ref 11.3–14.9)
LYMPHOCYTES ABSOLUTE COUNT: 0.9 10*9/L — ABNORMAL LOW (ref 1.1–3.6)
LYMPHOCYTES RELATIVE PERCENT: 31.1 %
MEAN CORPUSCULAR HEMOGLOBIN CONC: 33.9 g/dL (ref 32.0–36.0)
MEAN CORPUSCULAR HEMOGLOBIN: 32.7 pg — ABNORMAL HIGH (ref 25.9–32.4)
MEAN CORPUSCULAR VOLUME: 96.5 fL — ABNORMAL HIGH (ref 77.6–95.7)
MEAN PLATELET VOLUME: 9.8 fL (ref 6.8–10.7)
MONOCYTES ABSOLUTE COUNT: 0.4 10*9/L (ref 0.3–0.8)
MONOCYTES RELATIVE PERCENT: 13 %
NEUTROPHILS ABSOLUTE COUNT: 1.5 10*9/L — ABNORMAL LOW (ref 1.8–7.8)
NEUTROPHILS RELATIVE PERCENT: 51.3 %
PLATELET COUNT: 112 10*9/L — ABNORMAL LOW (ref 150–450)
RED BLOOD CELL COUNT: 3.38 10*12/L — ABNORMAL LOW (ref 3.95–5.13)
RED CELL DISTRIBUTION WIDTH: 15.3 % — ABNORMAL HIGH (ref 12.2–15.2)
WBC ADJUSTED: 2.9 10*9/L — ABNORMAL LOW (ref 3.6–11.2)

## 2024-04-04 LAB — COMPREHENSIVE METABOLIC PANEL
ALBUMIN: 3.9 g/dL (ref 3.4–5.0)
ALKALINE PHOSPHATASE: 61 U/L (ref 46–116)
ALT (SGPT): 22 U/L (ref 10–49)
ANION GAP: 13 mmol/L (ref 5–14)
AST (SGOT): 21 U/L (ref <=34)
BILIRUBIN TOTAL: 0.4 mg/dL (ref 0.3–1.2)
BLOOD UREA NITROGEN: 12 mg/dL (ref 9–23)
BUN / CREAT RATIO: 20
CALCIUM: 8.9 mg/dL (ref 8.7–10.4)
CHLORIDE: 106 mmol/L (ref 98–107)
CO2: 25 mmol/L (ref 20.0–31.0)
CREATININE: 0.6 mg/dL (ref 0.55–1.02)
EGFR CKD-EPI (2021) FEMALE: >90 mL/min/1.73m2 (ref >=60)
GLUCOSE RANDOM: 256 mg/dL — ABNORMAL HIGH (ref 70–179)
POTASSIUM: 4 mmol/L (ref 3.4–4.8)
PROTEIN TOTAL: 6.7 g/dL (ref 5.7–8.2)
SODIUM: 144 mmol/L (ref 135–145)

## 2024-04-04 LAB — HEMATOPATHOLOGY LEUKEMIA/LYMPHOMA FLOW CYTOMETRY, CSF
LYMPHS CSF: 90 %
MONO/MACROPHAGE CSF: 10 %
NUCLEATED CELLS, CSF: 28 ul — ABNORMAL HIGH (ref <=5)
NUMBER OF CELLS CSF: 100
RBC CSF: 34 ul — ABNORMAL HIGH (ref <2)

## 2024-04-04 MED ADMIN — methotrexate (PRESERVATIVE FREE) 12 mg, hydrocortisone sod succ (Solu-CORTEF) 50 mg in sodium chloride (NS) 0.9 % 6 mL INTRATHECAL syringe: INTRATHECAL | @ 18:00:00 | Stop: 2024-04-04

## 2024-04-04 MED ADMIN — midazolam (VERSED) injection 2 mg: 2 mg | INTRAVENOUS | @ 18:00:00 | Stop: 2024-04-04

## 2024-04-04 MED ADMIN — dextrose 5 % infusion: 30 mL/h | INTRAVENOUS | @ 15:00:00 | Stop: 2024-04-04

## 2024-04-04 MED ADMIN — diphenhydrAMINE (BENADRYL) capsule/tablet 25 mg: 25 mg | ORAL | @ 15:00:00 | Stop: 2024-04-04

## 2024-04-04 MED ADMIN — heparin, porcine (PF) 100 unit/mL injection 500 Units: 500 [IU] | INTRAVENOUS | @ 20:00:00 | Stop: 2024-04-05

## 2024-04-04 MED ADMIN — traMADol (ULTRAM) tablet 50 mg: 50 mg | ORAL | @ 19:00:00 | Stop: 2024-04-04

## 2024-04-04 MED ADMIN — immun glob G(IgG)-pro-IgA 0-50 (PRIVIGEN) 10 % intravenous solution 30 g: .4 g/kg | INTRAVENOUS | @ 15:00:00 | Stop: 2024-04-04

## 2024-04-04 MED ADMIN — acetaminophen (TYLENOL) tablet 650 mg: 650 mg | ORAL | @ 15:00:00 | Stop: 2024-04-04

## 2024-04-04 NOTE — Unmapped (Signed)
 Pt present to the clinic, Chair 10 for the following???    Lumbar Puncture with IT Chemotherapy.  Consents were signed and verified.  A time-out was performed at 13:52, with Asberry Adjutant, APP.     Chemo: IT MTX  Chemo day: C 3 D 29    Data: vital signs stable, review of systems within parameters, labs reviewed within parameters, adverse drug reactions reviewed, or family/caregiver present. IV access R CW port.    Action: BSA/agent double checked with Asberry Adjutant, APP, who administered treatment as ordered. The following medications were given 2 mg midazolam  (VERSED ) injection.     Response: tolerated without complaints or no redness or edema at lumbar spine injection site or Right chest-wall port-a-cath.  Pt tolerated the procedure well w/o any s/sx of adverse reaction.   Right chest-wall port-a-cath was de-access and heparin  lock .  Site was CDI and free from s/sx of infection.  AVS was decline.  Pt was discharged in stable condition.  Pt ambulated independently off the unit accompanied by her family to the front of the cancer center where her ride awaits to take her home.

## 2024-04-04 NOTE — Unmapped (Signed)
 Patient to triage. Blina pump discontinued- pulled 5cc off.Labs drawn and sent. Port flushed and capped. Patient to [facility] for treatment w/ Garnette. Plan of care reviewed, safety maintained.

## 2024-04-04 NOTE — Unmapped (Signed)
 Lumbar Puncture Procedure Note  Name: Patricia [name]  Date: 04/04/2024  MRN: [id number]  DOB: June 19, 1976    Diagnosis: Ph+ B-ALL    Indications: IT PPX    Premed: Versed  2mg  IV once, tramadol  50mg  PO after procedure    Pre-procedural Planning      Contraindications to performing lumbar puncture: none     Imaging prior to lumbar puncture: Generally, imaging should be obtained prior to performing a lumbar puncture if the patient is > 47 years old, immunocompromised, has had a seizure within one week of presentation, has an abnormal level of consciousness, or an abnormal neurologic exam (including papilledema).      Antiplatelet Agents: This patient is not on an antiplatelet agent.     Systemic Anticoagulation: This patient is not on full systemic anticoagulation.     Significant Labs:  Plt =  112k         Consent: Informed consent was obtained. Risks of the procedure were discussed including: infection, bleeding, pain and headache.    A time-out in which Her patient identifiers were checked by 2 providers was performed.    Procedure Details      The patient was positioned under sterile conditions. Betadine solution and sterile drapes were utilized. Local anesthesia with 1% lidocaine  was applied subcutaneously then deep to the skin. A spinal needle was inserted at the L3 - L4 interspace. Spinal fluid was obtained and sent to the laboratory.    Intrathecal MTX was administered over 5 min.    The spinal needle with trocar was removed with minimal bleeding noted upon removal. A sterile bandage was placed over the puncture site after holding pressure.      Findings  6mL of clear spinal fluid was obtained.    Complications:  None; patient tolerated the procedure well.          Condition: stable    Plan  Bed rest for 1 hour.

## 2024-04-04 NOTE — Unmapped (Signed)
 Hospital Outpatient Visit on 04/04/2024   Component Date Value Ref Range Status    WBC 04/04/2024 2.9 (L)  3.6 - 11.2 10*9/L Final    RBC 04/04/2024 3.38 (L)  3.95 - 5.13 10*12/L Final    HGB 04/04/2024 11.1 (L)  11.3 - 14.9 g/dL Final    HCT 90/95/7974 32.6 (L)  34.0 - 44.0 % Final    MCV 04/04/2024 96.5 (H)  77.6 - 95.7 fL Final    MCH 04/04/2024 32.7 (H)  25.9 - 32.4 pg Final    MCHC 04/04/2024 33.9  32.0 - 36.0 g/dL Final    RDW 90/95/7974 15.3 (H)  12.2 - 15.2 % Final    MPV 04/04/2024 9.8  6.8 - 10.7 fL Final    Platelet 04/04/2024 112 (L)  150 - 450 10*9/L Final    Neutrophils % 04/04/2024 51.3  % Final    Lymphocytes % 04/04/2024 31.1  % Final    Monocytes % 04/04/2024 13.0  % Final    Eosinophils % 04/04/2024 3.6  % Final    Basophils % 04/04/2024 1.0  % Final    Absolute Neutrophils 04/04/2024 1.5 (L)  1.8 - 7.8 10*9/L Final    Absolute Lymphocytes 04/04/2024 0.9 (L)  1.1 - 3.6 10*9/L Final    Absolute Monocytes 04/04/2024 0.4  0.3 - 0.8 10*9/L Final    Absolute Eosinophils 04/04/2024 0.1  0.0 - 0.5 10*9/L Final    Absolute Basophils 04/04/2024 0.0  0.0 - 0.1 10*9/L Final    Sodium 04/04/2024 144  135 - 145 mmol/L Final    Potassium 04/04/2024 4.0  3.4 - 4.8 mmol/L Final    Chloride 04/04/2024 106  98 - 107 mmol/L Final    CO2 04/04/2024 25.0  20.0 - 31.0 mmol/L Final    Anion Gap 04/04/2024 13  5 - 14 mmol/L Final    BUN 04/04/2024 12  9 - 23 mg/dL Final    Creatinine 90/95/7974 0.60  0.55 - 1.02 mg/dL Final    BUN/Creatinine Ratio 04/04/2024 20   Final    eGFR CKD-EPI (2021) Female 04/04/2024 >90  >=60 mL/min/1.4m2 Final    eGFR calculated with CKD-EPI 2021 equation in accordance with [employer] and [employer] of Nephrology Task Force recommendations.    Glucose 04/04/2024 256 (H)  70 - 179 mg/dL Final    Calcium  04/04/2024 8.9  8.7 - 10.4 mg/dL Final    Albumin 90/95/7974 3.9  3.4 - 5.0 g/dL Final    Total Protein 04/04/2024 6.7  5.7 - 8.2 g/dL Final    Total Bilirubin 04/04/2024 0.4  0.3 - 1.2 mg/dL Final    AST 90/95/7974 21  <=34 U/L Final    ALT 04/04/2024 22  10 - 49 U/L Final    Alkaline Phosphatase 04/04/2024 61  46 - 116 U/L Final

## 2024-04-04 NOTE — Unmapped (Signed)
 RED ZONE Means: RED ZONE: Take action now!     You need to be seen right away  Symptoms are at a severe level of discomfort    Call 911 or go to your nearest  Hospital for help     - Bleeding that will not stop    - Hard to breathe    - New seizure - Chest pain  - Fall or passing out  -Thoughts of hurting    yourself or others      Call 911 if you are going into the RED ZONE                  YELLOW ZONE Means:     Please call with any new or worsening symptom(s), even if not on this list.  Call [phone]  After hours, weekends, and holidays - you will reach a long recording with specific instructions, If not in an emergency such as above, please listen closely all the way to the end and choose the option that relates to your need.   You can be seen by a provider the same day through our Same Day Acute Care for Patients with Cancer program.      YELLOW ZONE: Take action today     Symptoms are new or worsening  You are not within your goal range for:    - Pain    - Shortness of breath    - Bleeding (nose, urine, stool, wound)    - Feeling sick to your stomach and throwing up    - Mouth sores/pain in your mouth or throat    - Hard stool or very loose stools (increase in       ostomy output)    - No urine for 12 hours    - Feeding tube or other catheter/tube issue    - Redness or pain at previous IV or port/catheter site    - Depressed or anxiety   - Swelling (leg, arm, abdomen,     face, neck)  - Skin rash or skin changes  - Wound issues (redness, drainage,    re-opened)  - Confusion  - Vision changes  - Fever >100.4 F or chills  - Worsening cough with mucus that is    green, yellow, or bloody  - Pain or burning when going to the    bathroom  - Home Infusion Pump Issue- call    [phone]         Call your healthcare provider if you are going into the YELLOW ZONE     GREEN ZONE Means:  Your symptoms are under controls  Continue to take your medicine as ordered  Keep all visits to the provider GREEN ZONE: You are in control  No increase or worsening symptoms  Able to take your medicine  Able to drink and eat    - DO NOT use MyChart messages to report red or yellow symptoms. Allow up to 3    business days for a reply.  -MyChart is for non-urgent medication refills, scheduling requests, or other general questions.         [id number] Rev. 01/28/2022  Approved by Oncology Patient Education Committee

## 2024-04-04 NOTE — Unmapped (Signed)
 Patient arrived to unit with no s/s of distress.  Patient verbalized no complaints.  Infusion competed without adverse events.  Patient stable at time of discharge.  Patient ambulated out of unit with family member.

## 2024-04-05 DIAGNOSIS — C9100 Acute lymphoblastic leukemia not having achieved remission: Principal | ICD-10-CM

## 2024-04-08 ENCOUNTER — Inpatient Hospital Stay: Admit: 2024-04-08 | Discharge: 2024-04-08 | Payer: PRIVATE HEALTH INSURANCE

## 2024-04-08 DIAGNOSIS — C9100 Acute lymphoblastic leukemia not having achieved remission: Principal | ICD-10-CM

## 2024-04-10 MED FILL — ICLUSIG 30 MG TABLET: ORAL | 30 days supply | Qty: 30 | Fill #5

## 2024-04-10 MED FILL — PREVYMIS 480 MG TABLET: ORAL | 28 days supply | Qty: 28 | Fill #1

## 2024-04-11 ENCOUNTER — Other Ambulatory Visit: Admit: 2024-04-11 | Discharge: 2024-04-11 | Payer: PRIVATE HEALTH INSURANCE

## 2024-04-11 ENCOUNTER — Inpatient Hospital Stay
Admit: 2024-04-11 | Discharge: 2024-04-11 | Payer: PRIVATE HEALTH INSURANCE | Attending: Adult Health | Primary: Adult Health

## 2024-04-11 ENCOUNTER — Inpatient Hospital Stay: Admit: 2024-04-11 | Discharge: 2024-04-11 | Payer: PRIVATE HEALTH INSURANCE

## 2024-04-11 DIAGNOSIS — C9100 Acute lymphoblastic leukemia not having achieved remission: Principal | ICD-10-CM

## 2024-04-11 LAB — CBC W/ AUTO DIFF
BASOPHILS ABSOLUTE COUNT: 0 10*9/L (ref 0.0–0.1)
BASOPHILS RELATIVE PERCENT: 0.8 %
EOSINOPHILS ABSOLUTE COUNT: 0.1 10*9/L (ref 0.0–0.5)
EOSINOPHILS RELATIVE PERCENT: 2.3 %
HEMATOCRIT: 32 % — ABNORMAL LOW (ref 34.0–44.0)
HEMOGLOBIN: 11.2 g/dL — ABNORMAL LOW (ref 11.3–14.9)
LYMPHOCYTES ABSOLUTE COUNT: 0.7 10*9/L — ABNORMAL LOW (ref 1.1–3.6)
LYMPHOCYTES RELATIVE PERCENT: 28.1 %
MEAN CORPUSCULAR HEMOGLOBIN CONC: 35.2 g/dL (ref 32.0–36.0)
MEAN CORPUSCULAR HEMOGLOBIN: 33.4 pg — ABNORMAL HIGH (ref 25.9–32.4)
MEAN CORPUSCULAR VOLUME: 95.1 fL (ref 77.6–95.7)
MEAN PLATELET VOLUME: 10.2 fL (ref 6.8–10.7)
MONOCYTES ABSOLUTE COUNT: 0.3 10*9/L (ref 0.3–0.8)
MONOCYTES RELATIVE PERCENT: 10.8 %
NEUTROPHILS ABSOLUTE COUNT: 1.5 10*9/L — ABNORMAL LOW (ref 1.8–7.8)
NEUTROPHILS RELATIVE PERCENT: 58 %
PLATELET COUNT: 69 10*9/L — ABNORMAL LOW (ref 150–450)
RED BLOOD CELL COUNT: 3.36 10*12/L — ABNORMAL LOW (ref 3.95–5.13)
RED CELL DISTRIBUTION WIDTH: 14.1 % (ref 12.2–15.2)
WBC ADJUSTED: 2.5 10*9/L — ABNORMAL LOW (ref 3.6–11.2)

## 2024-04-11 LAB — HEMATOPATHOLOGY LEUKEMIA/LYMPHOMA FLOW CYTOMETRY, CSF
LYMPHS CSF: 91 %
MONO/MACROPHAGE CSF: 9 %
NUCLEATED CELLS, CSF: 34 ul — ABNORMAL HIGH (ref <=5)
NUMBER OF CELLS CSF: 100
RBC CSF: 6 ul — ABNORMAL HIGH (ref <2)

## 2024-04-11 LAB — COMPREHENSIVE METABOLIC PANEL
ALBUMIN: 3.4 g/dL (ref 3.4–5.0)
ALKALINE PHOSPHATASE: 63 U/L (ref 46–116)
ALT (SGPT): 49 U/L (ref 10–49)
ANION GAP: 12 mmol/L (ref 5–14)
AST (SGOT): 29 U/L (ref <=34)
BILIRUBIN TOTAL: 0.3 mg/dL (ref 0.3–1.2)
BLOOD UREA NITROGEN: 12 mg/dL (ref 9–23)
BUN / CREAT RATIO: 17
CALCIUM: 8.8 mg/dL (ref 8.7–10.4)
CHLORIDE: 104 mmol/L (ref 98–107)
CO2: 26 mmol/L (ref 20.0–31.0)
CREATININE: 0.69 mg/dL (ref 0.55–1.02)
EGFR CKD-EPI (2021) FEMALE: >90 mL/min/1.73m2 (ref >=60)
GLUCOSE RANDOM: 362 mg/dL — ABNORMAL HIGH (ref 70–179)
POTASSIUM: 3.4 mmol/L (ref 3.4–4.8)
PROTEIN TOTAL: 6.7 g/dL (ref 5.7–8.2)
SODIUM: 142 mmol/L (ref 135–145)

## 2024-04-11 MED ADMIN — traMADol (ULTRAM) tablet 50 mg: 50 mg | ORAL | @ 17:00:00 | Stop: 2024-04-11

## 2024-04-11 MED ADMIN — heparin, porcine (PF) 100 unit/mL injection 500 Units: 500 [IU] | INTRAVENOUS | @ 19:00:00 | Stop: 2024-04-12

## 2024-04-11 MED ADMIN — midazolam (VERSED) injection 2 mg: 2 mg | INTRAVENOUS | @ 17:00:00 | Stop: 2024-04-11

## 2024-04-11 MED ADMIN — methotrexate (PRESERVATIVE FREE) 12 mg, hydrocortisone sod succ (Solu-CORTEF) 50 mg in sodium chloride (NS) 0.9 % 6 mL INTRATHECAL syringe: INTRATHECAL | @ 17:00:00 | Stop: 2024-04-11

## 2024-04-11 NOTE — Unmapped (Signed)
 Brief Adult Oncology Infusion Clinic Note:    Paged regarding: Diarrhea    Patient presents to the infusion clinic for scheduled LP and IT chemo. Has c/o diarrhea. Monday 04/08/2024 ate ice cream from an establishment she had not visited before. Began having episodes of liquid diarrhea with mild abdominal pain. No fevers, chills. Had an episode of vomiting on Tuesday.  Tried OTC Pepto-Bismul. Symptoms resolved. This morning she had two episodes of loose stools. Not watery as before and not formed either. Still without fevers, chills, N/V.     Unclear etiology. Possible norovirus vs lactose intolerance. Patient condition seems to be improving. Able to tolerate po without complications. VSS and CMP reviewed and stable today. She is not neutropenic (ANC= 1.5) Patient advised to maintain hydration; modify diet to [employer] and start OTC Loperamide  per package instructions. Provided patient with written handout for self management of diarrhea. Patient to notify primary oncology team if symptoms worsen. Patient verbalized understanding of the information provided.         Daris Aristizabal, ANP  ADVANCED PRACTICE PROVIDER FOR INFUSION PROGRAM

## 2024-04-11 NOTE — Unmapped (Signed)
 Pt present to the clinic, Bed 10 for the following???    Lumbar Puncture with IT Chemotherapy.  Consents were signed and verified.  A time-out was performed at 1257, with Asberry RAMAN, APP.     Chemo: Methotrexate   Chemo day: C3D36    Data: vital signs stable, review of systems within parameters, labs reviewed within parameters, or adverse drug reactions reviewed. IV access R chest wall port.    Action: BSA/agent double checked with Asberry RAMAN, APP. The following medications were given midazolam  (VERSED ) injection and tramadol .     Response: tolerated without complaints or no redness or edema at   injection site.  Pt tolerated the procedure well w/o any s/sx of adverse reaction.       R chest-wall port-a-cath was de-access and heparin  lock .  Site was CDI and free from s/sx of infection.  AVS was declined.  Pt was discharged in stable condition.  Pt wheeled off the unit accompanied by her family to the front of the cancer center where her ride awaits to take her home.

## 2024-04-11 NOTE — Unmapped (Signed)
 Lumbar Puncture Procedure Note  Name: Patricia [name]  Date: 04/11/2024  MRN: [id number]  DOB: 1976/07/07    Diagnosis: Ph+ B-ALL    Indications: IT PPX    Premed: versed  2mg  IV once, tramadol  50mg  PO     Pre-procedural Planning      Contraindications to performing lumbar puncture: none     Imaging prior to lumbar puncture: Generally, imaging should be obtained prior to performing a lumbar puncture if the patient is > 47 years old, immunocompromised, has had a seizure within one week of presentation, has an abnormal level of consciousness, or an abnormal neurologic exam (including papilledema).      Antiplatelet Agents: This patient is not on an antiplatelet agent.     Systemic Anticoagulation: This patient is not on full systemic anticoagulation.     Significant Labs:  Plt =  69k         Consent: Informed consent was obtained. Risks of the procedure were discussed including: infection, bleeding, pain and headache.    A time-out in which Her patient identifiers were checked by 2 providers was performed.    Procedure Details      The patient was positioned under sterile conditions. Betadine solution and sterile drapes were utilized. Local anesthesia with 1% lidocaine  was applied subcutaneously then deep to the skin. A spinal needle was inserted at the L3 - L4 interspace. Spinal fluid was obtained and sent to the laboratory.    Intrathecal MTX was administered over 5 min.    The spinal needle with trocar was removed with minimal bleeding noted upon removal. A sterile bandage was placed over the puncture site after holding pressure.      Findings  6mL of clear spinal fluid was obtained.    Complications:  None; patient tolerated the procedure well.          Condition: stable    Plan  Bed rest for 1 hour.

## 2024-04-12 DIAGNOSIS — C9100 Acute lymphoblastic leukemia not having achieved remission: Principal | ICD-10-CM

## 2024-04-12 NOTE — Unmapped (Signed)
 Outpatient BMT SW Note: SW called pt to try and discuss some potential recent caregiver changes for her post transplant period. Pt was not available at time of call. SW left a VM and requested a return call when able.    3:15 pm    SW received a return call from pt. SW reviewed the reason for call.    Pt reports their have been some slight changes to her caregiver plan for her transplant recovery period.    Primary Caregiver: Gaelyn Tukes, patient's mother [phone])    Back Up Caregiver #1 Thersia Cook, friend [phone])    Back Up Caregiver # 2 on the nights and weekends: Devaughn Ranger, patient's husband [phone])     Pt reports her mother will be the primary caregiver for the first 30 days of her recovery period when she may need to stay at the [facility]. She reports that after those initial 30 days, she hopes she will be able to return home and her friend Thersia will stay with her at home for the rest of her recovery period.    The social worker explained that pt may need to stay in the [facility] area for greater than 30 days if medical issues arise. Pt voices understanding with this and states if that is necessary; she will plan to stay at [facility] longer than 30 days with her mother, she is just hopeful that she will be medically able to return home to complete her recovery period at home vs [facility].    She reports that her husband Devaughn will still be the primary on nights and weekends and will fill in and/or take off for a few days if any emergency situations occur with any of her other caregivers during her recovery period of 70 + days.    SW called Jeanie Kley. Mrs. Carpino reports she is retired and she can travel from [place]  to [place] , at any point that pt may need her. She reports that she is medically able to assist as a caregiver and the only doctors she sees are for annual check ups. She reports she is planning to stay with pt for at least the first 30 days if not beyond and then Rutledge will come and stay with her for the reminder of pt's recovery period, once patient is able to medically return home. She reports if she gets sick or has an emergency situation arise Devaughn will be able to fill in, until she can return. She indicates that if pt needs her to stay beyond 30 days of her recovery period she will be able to do that without any issues.    SW called Alexis Colarusso. Mrs Colarusso reports she is retired and also has flexibity with her schedule. She reports she will be able to assist pt whenever needed throughout her recovery period. She reports she has 2 small dogs that will stay with her while she is with pt. She reports she will take over the caregiver role, once pt is medically cleared to return home from her [facility] stay. She denies any barriers.

## 2024-04-15 DIAGNOSIS — C9100 Acute lymphoblastic leukemia not having achieved remission: Principal | ICD-10-CM

## 2024-04-15 NOTE — Unmapped (Signed)
 Outpatient BMT SW Note: SW called Devaughn Ranger, patient's husband [phone]) The social worker confirmed with Mr. Ranger that he would be able to take off any time that may be needed, if an emergency situation arises in travel or sickness for either of the other identified caregivers during his wife's recovery period. He reports he is also planning to be his wife's caregiver on nights and weekends when he is not at work. The social worker encouraged him to call if any questions or needs arise.

## 2024-04-15 NOTE — Unmapped (Signed)
 DIVISION OF CARDIOLOGY  [facility] , [place] [place]        Date of Service: 04/16/2024    PCP: Referring Provider:   Estelle Toribio SAUNDERS, MD  [address]  [place] [address]  Phone: [phone]  Fax: [phone] Patricia Patricia Craze, MD  [address]  [id number] [facility]  [place],  [place] [address]  Phone: [phone]  Fax: [phone]     Assessment and Plan:     Patricia [name] is a 48 y.o. 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     Assessment & Plan    Pre-stem cell transplant evaluation  Cancer therapy related cardiac dysfunction  HFmrEF  Referred to cardiology for evaluation prior to stem cell transplant as well as history of mildly reduced LVEF, 45-50% on most recent TTE.  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 She had a normal LV EF prior to chemotherapy in 2023. She does have a history of hypertension, which could have contributed to her cardiomyopathy. She is currently without symptoms of congestive heart failure. Would like to repeat limited TTE to assess LVEF. Already on losartan . If remains mildly reduced, could consider addition of SGLT-2 inhibitor versus MRA for GDMT for HFmrEF.  She otherwise has good exercise capacity and is without significant exertional symptoms such as chest pain/pressure.  With regards to her cardiac risk factors, her blood pressure is currently optimized on losartan  50 mg daily, she is currently managing her steroid-induced hyperglycemia with insulin .  She is currently taking aspirin  81 mg daily for VTE risk associated with Ponatinib .  - repeat limited TTE to assess LV EF  - consider GDMT for HFmrEF if EF remains mildly reduced  - no further cardiac evaluation indicated prior to stem cell transplant     HTN  History of hypertension and is currently on ponatinib , which may worsen her hypertension. BP at goal today 116/84 on losartan  50mg  daily.   - continue losartan  50mg  daily    Prolonged QTc  Prolonged QTc.  ECG today with QTc of 508, similar to previous ECG.  Potentially contributed by lexapro , Ponatinib .  - avoid QT prolonging meds  - close monitoring of QTc with new medications     The patient was seen and discussed with Dr. Fernande.    Return in about 3 months (around 07/16/2024).      Subjective:          Reason for Consultation: pre-stem cell transplant evaluation, mildly reduced EF    History of Present Illness: Patricia [name] is a 48 y.o. 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    The patient has historically followed with [facility] for treatment of B-ALL. Treatment has included multiple lines of therapy. Initially treated truncated course of GRAAPH-2005, hyperCVAD (hyperfractionated cyclophosphamide , vincristine , doxorubicin , dexamethasone ), and dasatinib  + maintenance (vincristine , prednisone ). Unfortunately progressed on therapy, experiencing CNS recurrence of disease. Current regimen is second line blinatumumab + ponatinib . Considering allogenic stem cell transplant this fall.    Patient reports history of hypertension but previously otherwise no history of cardiovascular disease.  Her TTE prior to initiation of chemotherapy in 2023 showed normal LVEF.  Her most recent TTE showed LVEF 45 to  50%.  The patient feels well.  She occasionally experiences dyspnea on heavy exertion but otherwise no dyspnea.  She has had no lower extremity edema.    She does not regularly check her blood pressure at home but consistently adheres to losartan  50 mg daily.  She has been managing her steroid-induced hyperglycemia with insulin .  She has a history of smoking cigarettes but quit 7 years ago.  Occasional alcohol use, a few drinks per month.  No other current substance use, previous occasional marijuana use.  History of Present Illness      Cardiovascular History:     Cardiovascular Problem List:  HTN  HFmrEF  Prolonged QTc     Cardiovascular Studies:  ECG (notable and most recent):  04/16/2024: NSR, normal axis, prolonged Qtc 508 (personally reviewed)  02/01/2024: NSR, prolonged Qtc 509    Echo:  01/2024: LV normal size and wall thickness, borderline LVEF (45-50%), normal RV size and function, trivial pericardial effusion  11/2023: LV normal size and wall thickness, mildly decreased LVEF (45%), mildly thickened mitral valve, normal RV size and function, small posterior pericardial effusion  09/2021: normal LV size and function, LVEF estimated at 65%, normal RV size and function, no significant valvular abnormalities    Stress test:  None to date    Cardiac CT/MRI:  None to date    LHC:  None to date    EP:   None to date      Medical History:       Past medical history:  Problem List[1]    Medications:   Patient's Medications   New Prescriptions    No medications on file   Previous Medications    ACETAMINOPHEN  (TYLENOL  8 HOUR) 650 MG CR TABLET    Take 2 tablets (1,300 mg total) by mouth every eight (8) hours as needed for pain.    ALBUTEROL  HFA 90 MCG/ACTUATION INHALER    Inhale 2 puffs every six (6) hours as needed for wheezing.    ASPIRIN  (ECOTRIN) 81 MG TABLET    Take 1 tablet (81 mg total) by mouth daily.    BLOOD SUGAR DIAGNOSTIC (GLUCOSE BLOOD) STRP    Use to check blood sugar two times a day.    BLOOD-GLUCOSE METER KIT    Use to check blood sugar two times a day. Use as instructed.    BLOOD-GLUCOSE SENSOR (DEXCOM G7 SENSOR) DEVI    1 each by Miscellaneous route every ten (10) days.    BLOOD-GLUCOSE,RECEIVER,CONT (DEXCOM G7 RECEIVER) MISC    1 each by Miscellaneous route once for 1 dose.    CETIRIZINE  (ZYRTEC ) 10 MG TABLET    Take 1 tablet (10 mg total) by mouth nightly.    DAPSONE  100 MG TABLET    Take 1 tablet (100 mg total) by mouth daily. TAKE 1 TABLET(100 MG) BY MOUTH DAILY    ESCITALOPRAM  OXALATE (LEXAPRO ) 10 MG TABLET    Take 1 tablet (10 mg total) by mouth daily.    GLUCAGON  SPRAY 3 MG/ACTUATION SPRY    Use 1 spray in 1 nostril for severe hypoglycemia, as per package instructions    INSULIN  ASPART (NOVOLOG  U-100 INSULIN  ASPART) 100 UNIT/ML VIAL    Inject 0.08 mL (8 Units total) under the skin Three (3) times a day before meals.    INSULIN  GLARGINE (LANTUS  SOLOSTAR U-100 INSULIN ) 100 UNIT/ML (3 ML) INJECTION PEN    Inject 0.22 mL (22 Units total) under the skin nightly.    LANCETS MISC  Use to check blood sugar as directed 2 times a day & for symptoms of high or low blood sugar.    LETERMOVIR  (PREVYMIS ) 480 MG TABLET    Take 1 tablet (480 mg total) by mouth daily.    LOSARTAN  (COZAAR ) 50 MG TABLET    Take 1 tablet (50 mg total) by mouth in the morning.    PONATINIB  (ICLUSIG ) 30 MG TABLET    Take 1 tablet (30 mg total) by mouth daily. Swallow tablets whole. Do not crush, break, cut or chew tablets.    ROSUVASTATIN  (CRESTOR ) 10 MG TABLET    Take 1 tablet (10 mg total) by mouth nightly.    VALACYCLOVIR  (VALTREX ) 500 MG TABLET    Take 1 tablet (500 mg total) by mouth daily.   Modified Medications    No medications on file   Discontinued Medications    No medications on file       Allergies:  Allergies[2]    Social History:  She  reports that she quit smoking about 8 years ago. Her smoking use included cigarettes. She has never used smokeless tobacco. She reports that she does not currently use alcohol. She reports that she does not currently use drugs.    Family History:  Her family history is not on file.    Review of Systems  Pertinent positive and negatives are documented in the HPI. The remainder of the system review is negative.             Objective:           Physical Exam  BP 116/84 (BP Site: R Arm, BP Position: Sitting)  - Pulse 85  - Ht 168 cm (5' 6.14)  - Wt 82.7 kg (182 lb 6.4 oz)  - SpO2 97%  - BMI 29.31 kg/m??    Wt Readings from Last 3 Encounters:   04/16/24 82.7 kg (182 lb 6.4 oz)   04/11/24 82.6 kg (182 lb 1.6 oz)   03/22/24 83.9 kg (185 lb)       Physical Exam      General: Alert, NAD, sitting up comfortably in chair at side of exam table  HEENT: Sclera anicteric  Cardiac: normal rate, regular rhythm, no murmurs rubs or gallops. No JVD at 90 degrees. Radial pulses 2+ b/l  Pulmonary: CTAB, no increased work of breathing  Extremities: No LE edema. Legs are warm  Neuro: Alert and oriented. No focal deficits       Most recent labs   Lab Results   Component Value Date    Sodium 142 04/11/2024    Potassium 3.4 04/11/2024    Chloride 104 04/11/2024    CO2 26.0 04/11/2024    BUN 12 04/11/2024    Creatinine 0.69 04/11/2024    Creatinine 0.60 04/04/2024    Creatinine 0.63 03/28/2024    Magnesium  2.0 03/28/2024     Lab Results   Component Value Date    HGB 11.2 (L) 04/11/2024    MCV 95.1 04/11/2024    Platelet 69 (L) 04/11/2024     Lab Results   Component Value Date    Cholesterol, Total 230 (H) 11/21/2023    Triglycerides 361 (H) 11/21/2023    Cholesterol, HDL 39 (L) 11/21/2023    Cholesterol, HDL 37 (L) 12/27/2022    Cholesterol, HDL 42 11/15/2022    Cholesterol, Non-HDL, Calculated 191 (H) 11/21/2023    Cholesterol, LDL, Calculated 129 (H) 11/21/2023    Cholesterol, LDL, Calculated 144 (H) 12/27/2022  Cholesterol, LDL, Calculated  11/15/2022      Comment:      NHLBI Recommended Ranges, LDL Cholesterol, for Adults (20+yrs) (ATPIII), mg/dL  Optimal              <899  Near Optimal        100-129  Borderline High     130-159  High                160-189  Very High            >=190  NHLBI Recommended Ranges, LDL Cholesterol, for Children (2-19 yrs), mg/dL  Desirable            <889  Borderline High     110-129  High                 >=130    Unable to calculate due to triglyceride greater than 400 mg/dL.    HGB A1C, POC 5.0 03/22/2024    Hemoglobin A1C 7.1 (H) 10/17/2023    Hemoglobin A1C <3.8 (L) 11/15/2022    INR 1.04 02/16/2024 INR 0.96 12/15/2023    INR 0.93 12/14/2023    hsTroponin I 5 11/24/2023       Results        Ileana Daring, MD  Cardiology fellow PGY-4  04/16/24        Disclaimer: [employer] voice recognition software was used to create portions of this document.  An attempt at proofreading has been made to minimize errors.  If, however, the reader notes inconsistent information and/or needs to ask questions concerning any part of the document, please contact me for clarification.  Occasional interpretation errors may inadvertently occur due to limitations in the software.            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    Sulfa (Sulfonamide Antibiotics) Anaphylaxis     Other reaction(s): Not available    Azithromycin Hives     Per patient report    Clindamycin      Other Reaction(s): Unknown    Metoprolol Other (See  Comments)     Other Reaction(s): Edema

## 2024-04-16 ENCOUNTER — Ambulatory Visit: Admit: 2024-04-16 | Discharge: 2024-04-17 | Payer: PRIVATE HEALTH INSURANCE

## 2024-04-16 DIAGNOSIS — R9431 Abnormal electrocardiogram [ECG] [EKG]: Principal | ICD-10-CM

## 2024-04-16 DIAGNOSIS — I5021 Acute systolic (congestive) heart failure: Principal | ICD-10-CM

## 2024-04-16 DIAGNOSIS — C9100 Acute lymphoblastic leukemia not having achieved remission: Principal | ICD-10-CM

## 2024-04-16 DIAGNOSIS — I10 Essential (primary) hypertension: Principal | ICD-10-CM

## 2024-04-16 NOTE — Unmapped (Signed)
 Hi Patricia [name],  It was nice to meet you today. Here is a summary of today's visit:  Mildly reduced heart function: This is likely a result of your chemotherapy. We would like to get a repeat heart ultrasound, but you should not need any further workup prior to your stem cell transplant.   We will plan to follow up in 3 months.   Thank you,  Ileana Daring, MD

## 2024-04-18 ENCOUNTER — Ambulatory Visit
Admit: 2024-04-18 | Discharge: 2024-04-19 | Payer: PRIVATE HEALTH INSURANCE | Attending: Adult Health | Primary: Adult Health

## 2024-04-18 ENCOUNTER — Inpatient Hospital Stay: Admit: 2024-04-18 | Discharge: 2024-04-19 | Payer: PRIVATE HEALTH INSURANCE

## 2024-04-18 ENCOUNTER — Other Ambulatory Visit: Admit: 2024-04-18 | Discharge: 2024-04-19 | Payer: PRIVATE HEALTH INSURANCE

## 2024-04-18 DIAGNOSIS — C9100 Acute lymphoblastic leukemia not having achieved remission: Principal | ICD-10-CM

## 2024-04-18 DIAGNOSIS — J0191 Acute recurrent sinusitis, unspecified: Principal | ICD-10-CM

## 2024-04-18 LAB — CBC W/ AUTO DIFF
BASOPHILS ABSOLUTE COUNT: 0 10*9/L (ref 0.0–0.1)
BASOPHILS RELATIVE PERCENT: 0.7 %
EOSINOPHILS ABSOLUTE COUNT: 0.1 10*9/L (ref 0.0–0.5)
EOSINOPHILS RELATIVE PERCENT: 2.6 %
HEMATOCRIT: 33.9 % — ABNORMAL LOW (ref 34.0–44.0)
HEMOGLOBIN: 12 g/dL (ref 11.3–14.9)
LYMPHOCYTES ABSOLUTE COUNT: 1.1 10*9/L (ref 1.1–3.6)
LYMPHOCYTES RELATIVE PERCENT: 26.1 %
MEAN CORPUSCULAR HEMOGLOBIN CONC: 35.4 g/dL (ref 32.0–36.0)
MEAN CORPUSCULAR HEMOGLOBIN: 33.2 pg — ABNORMAL HIGH (ref 25.9–32.4)
MEAN CORPUSCULAR VOLUME: 93.8 fL (ref 77.6–95.7)
MEAN PLATELET VOLUME: 10.7 fL (ref 6.8–10.7)
MONOCYTES ABSOLUTE COUNT: 0.4 10*9/L (ref 0.3–0.8)
MONOCYTES RELATIVE PERCENT: 10.1 %
NEUTROPHILS ABSOLUTE COUNT: 2.6 10*9/L (ref 1.8–7.8)
NEUTROPHILS RELATIVE PERCENT: 60.5 %
PLATELET COUNT: 99 10*9/L — ABNORMAL LOW (ref 150–450)
RED BLOOD CELL COUNT: 3.61 10*12/L — ABNORMAL LOW (ref 3.95–5.13)
RED CELL DISTRIBUTION WIDTH: 14.8 % (ref 12.2–15.2)
WBC ADJUSTED: 4.2 10*9/L (ref 3.6–11.2)

## 2024-04-18 LAB — COMPREHENSIVE METABOLIC PANEL
ALBUMIN: 3.9 g/dL (ref 3.4–5.0)
ALKALINE PHOSPHATASE: 74 U/L (ref 46–116)
ALT (SGPT): 60 U/L — ABNORMAL HIGH (ref 10–49)
ANION GAP: 13 mmol/L (ref 5–14)
AST (SGOT): 37 U/L — ABNORMAL HIGH (ref <=34)
BILIRUBIN TOTAL: 0.5 mg/dL (ref 0.3–1.2)
BLOOD UREA NITROGEN: 16 mg/dL (ref 9–23)
BUN / CREAT RATIO: 29
CALCIUM: 9.4 mg/dL (ref 8.7–10.4)
CHLORIDE: 104 mmol/L (ref 98–107)
CO2: 26 mmol/L (ref 20.0–31.0)
CREATININE: 0.55 mg/dL (ref 0.55–1.02)
EGFR CKD-EPI (2021) FEMALE: >90 mL/min/1.73m2 (ref >=60)
GLUCOSE RANDOM: 173 mg/dL (ref 70–179)
POTASSIUM: 3.8 mmol/L (ref 3.4–4.8)
PROTEIN TOTAL: 7.5 g/dL (ref 5.7–8.2)
SODIUM: 143 mmol/L (ref 135–145)

## 2024-04-18 LAB — PHOSPHORUS: PHOSPHORUS: 4.3 mg/dL (ref 2.4–5.1)

## 2024-04-18 LAB — C-REACTIVE PROTEIN: C-REACTIVE PROTEIN: 7.4 mg/L (ref <=10.0)

## 2024-04-18 LAB — MAGNESIUM: MAGNESIUM: 1.8 mg/dL (ref 1.6–2.6)

## 2024-04-18 MED ORDER — LOSARTAN 50 MG TABLET
ORAL_TABLET | Freq: Every day | ORAL | 3 refills | 100.00000 days | Status: CP
Start: 2024-04-18 — End: ?

## 2024-04-18 MED ORDER — ESCITALOPRAM 10 MG TABLET
ORAL_TABLET | Freq: Every day | ORAL | 2 refills | 90.00000 days | Status: CP
Start: 2024-04-18 — End: ?

## 2024-04-18 MED ORDER — AMOXICILLIN 875 MG-POTASSIUM CLAVULANATE 125 MG TABLET
ORAL_TABLET | Freq: Two times a day (BID) | ORAL | 0 refills | 10.00000 days | Status: CP
Start: 2024-04-18 — End: 2024-04-28

## 2024-04-18 MED ORDER — DAPSONE 100 MG TABLET
ORAL_TABLET | Freq: Every day | ORAL | 11 refills | 30.00000 days | Status: CP
Start: 2024-04-18 — End: ?

## 2024-04-18 MED ADMIN — dexAMETHasone (DECADRON) 4 mg/mL injection 16 mg: 16 mg | INTRAVENOUS | @ 21:00:00 | Stop: 2024-04-18

## 2024-04-18 MED ADMIN — blinatumomab (BLINCYTO) 28 mcg/day in sodium chloride (non-PVC) 0.9 % IVPB (7-day OUTPATIENT CADD infusion): 28 ug/d | INTRAVENOUS | @ 21:00:00 | Stop: 2024-04-18

## 2024-04-18 NOTE — Unmapped (Signed)
 Lab on 04/18/2024   Component Date Value Ref Range Status    Sodium 04/18/2024 143  135 - 145 mmol/L Final    Potassium 04/18/2024 3.8  3.4 - 4.8 mmol/L Final    Chloride 04/18/2024 104  98 - 107 mmol/L Final    CO2 04/18/2024 26.0  20.0 - 31.0 mmol/L Final    Anion Gap 04/18/2024 13  5 - 14 mmol/L Final    BUN 04/18/2024 16  9 - 23 mg/dL Final    Creatinine 90/81/7974 0.55  0.55 - 1.02 mg/dL Final    BUN/Creatinine Ratio 04/18/2024 29   Final    eGFR CKD-EPI (2021) Female 04/18/2024 >90  >=60 mL/min/1.23m2 Final    eGFR calculated with CKD-EPI 2021 equation in accordance with [employer] and [employer] of Nephrology Task Force recommendations.    Glucose 04/18/2024 173  70 - 179 mg/dL Final    Calcium  04/18/2024 9.4  8.7 - 10.4 mg/dL Final    Albumin 90/81/7974 3.9  3.4 - 5.0 g/dL Final    Total Protein 04/18/2024 7.5  5.7 - 8.2 g/dL Final    Total Bilirubin 04/18/2024 0.5  0.3 - 1.2 mg/dL Final    AST 90/81/7974 37 (H)  <=34 U/L Final    ALT 04/18/2024 60 (H)  10 - 49 U/L Final    Alkaline Phosphatase 04/18/2024 74  46 - 116 U/L Final    Magnesium  04/18/2024 1.8  1.6 - 2.6 mg/dL Final    Phosphorus 90/81/7974 4.3  2.4 - 5.1 mg/dL Final    CRP 90/81/7974 7.4  <=10.0 mg/L Final    WBC 04/18/2024 4.2  3.6 - 11.2 10*9/L Final    RBC 04/18/2024 3.61 (L)  3.95 - 5.13 10*12/L Final    HGB 04/18/2024 12.0  11.3 - 14.9 g/dL Final    HCT 90/81/7974 33.9 (L)  34.0 - 44.0 % Final    MCV 04/18/2024 93.8  77.6 - 95.7 fL Final    MCH 04/18/2024 33.2 (H)  25.9 - 32.4 pg Final    MCHC 04/18/2024 35.4  32.0 - 36.0 g/dL Final    RDW 90/81/7974 14.8  12.2 - 15.2 % Final    MPV 04/18/2024 10.7  6.8 - 10.7 fL Final    Platelet 04/18/2024 99 (L)  150 - 450 10*9/L Final    Neutrophils % 04/18/2024 60.5  % Final    Lymphocytes % 04/18/2024 26.1  % Final    Monocytes % 04/18/2024 10.1  % Final    Eosinophils % 04/18/2024 2.6  % Final    Basophils % 04/18/2024 0.7  % Final    Absolute Neutrophils 04/18/2024 2.6 1.8 - 7.8 10*9/L Final    Absolute Lymphocytes 04/18/2024 1.1  1.1 - 3.6 10*9/L Final    Absolute Monocytes 04/18/2024 0.4  0.3 - 0.8 10*9/L Final    Absolute Eosinophils 04/18/2024 0.1  0.0 - 0.5 10*9/L Final    Absolute Basophils 04/18/2024 0.0  0.0 - 0.1 10*9/L Final

## 2024-04-18 NOTE — Unmapped (Addendum)
 Additional documentation required for BMT candidacy and insurance auth    Patient caregiver plan cleared by BMT SW, Kim on 04/15/24.     Cardiology clearance received 04/16/24.    Re-request sent for BMT insurance auth 04/17/24.

## 2024-04-18 NOTE — Unmapped (Signed)
 CHEMO:  Oncologic Diagnosis: ALL  Primary Oncologist: Katarzyna Jamieson, MD  Treatment Regimen:  LEUKEMIA BLINATUMOMAB  7-DAY INFUSION  Cycle: 4  Day: 1  Additional Treatment: No other needs  Transportation: Sales executive  Code Status: Full Code    Nursing Assessment  Patient presents alert and oriented, ambulating independently. Patient denies any new or worsening symptom management issues of note. Dina L, infusion APP completed neuro check prior to [facility] pump infusion.     Vascular Access  PIV assessed per protocol. Positive blood return noted prior to and post infusion. PIV removed prior to discharge. See flowsheet for further detail.     Antineoplastics  Treatment plan parameters were met.     Patient was premedicated per [facility] plan orders with Dexamethasone . See MAR for dosage and administration details.    Blinatumomab  CADD pump started per [facility] instructions. Blood return noted prior to infusion. See MAR for further details.      Discharge  Patient discharged Walking Independently in stable condition. Patient denies AVS print out. Patient states no needs at time of discharge.     Vena Quarto, RN.

## 2024-04-18 NOTE — Unmapped (Signed)
 [name]  MRN: [id number]  Encounter Date: 04/18/2024    Vital signs:  Vitals:    04/18/24 1557   BP: 118/83   Pulse: 74   Resp: 18   Temp: 36.4 ??C (97.5 ??F)   TempSrc: Temporal   SpO2: 97%     CRS:   Fever: No.  Rash: No.  Myalgias: No.  Hypotension: No.  Hypoxia: No.  Nausea and/or vomiting: No.     Neurotoxicity:   Immune Effector Cell-Associated Encephalopathy (ICE) Score  Orientation Year, Month, City, Hospital 4/4 Points   Naming Ability to name 3 objects (eg point to clock, pen, button) 3/3 Points   Follow Commands Ability to follow simple commands (eg ???Show me 2 fingers,??? ???Close your eyes and stick out your tongue.??? 1/1 [employer] Ability to write a standard sentence (eg ???Our national bird is the Human resources officer??? 1/1 Point   Attention Ability to count backwards from 100 by 10 1/1 Point      Total 10/10 points     Ok to proceed with blinatumomab  infusion today as long as all other treatment conditions have been met.     Harrie Saltness, AGNP

## 2024-04-19 DIAGNOSIS — C9100 Acute lymphoblastic leukemia not having achieved remission: Principal | ICD-10-CM

## 2024-04-19 NOTE — Unmapped (Signed)
 [facility] Follow-up    [name]  Patient Age: 48 y.o.  Encounter Date: 04/18/2024    Primary Care Provider:  Estelle Toribio SAUNDERS, MD    Referring Physician:  Iva Alyce Ned, AGNP  [address]  [id number]  [place],  [place] [address]      Cancer Diagnosis: Ph+ B-ALL; Initial Dx 10/29/2021  Cancer status: CR2, MRD negative, undetectable BCR/ABL <1/100,000 01/15/24  Treatment Regimen: Second Line, Blinatumumab (C1D1 5/13- )+ Ponatinib .                                      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 On cycle 4 of blinatumomab  and ponatinib . Allogeneic stem cell transplant planning ongoing. Risk of pancytopenia and hypogammaglobulinemia due to treatment.  - Continue ponatinib  30 mg daily.  - Start cycle 4 of blinatumomab .  - Coordinate with transplant team for allogeneic stem cell transplant.  - Consider stopping blinatumomab  early if transplant date is finalized before cycle completion.  - Plan lumbar punctures with intrathecal chemotherapy at end of cycle 4, days 29 and 36, if not transitioning to transplant sooner.  - Monitor labs for pancytopenia.  - Continue supportive care per standard protocols for transfusion support and infection prophylaxis.  - Continue IVIG every four weeks until transplant.    Recurrent acute sinusitis  Recurrent acute sinusitis with nasal congestion, frontal headache, and low-grade fever. Negative COVID and respiratory pathogen panel. Responded well to Augmentin .  - Start Augmentin  twice a day for 10 days.  - Perform nasal swab for respiratory pathogen panel. 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 Echocardiogram planned to assess cardiac function. Monitoring QTc interval for medication adjustments.  - Continue losartan .  - Follow up with cardiology.  - Repeat echocardiogram next week.  - Monitor QTc interval and adjust medications as needed.    Hypertension  Hypertension managed with losartan . Ponatinib  may contribute to hypertension. Coordinating with cardiology for management.  - Continue losartan .  - Coordinate with cardiology for hypertension management.    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   -09/2022 restarted Valcyte  with the goal of controlling CMV and limiting neutropenia  - continue letermovir   - d/c CMV levels as of 03/2023 - per Dr. Sheena  - 09/19/23 - CMV negative - checked due to elevated temps     Hemorrhoids, Anal fissure: persistent but stable, per patient  - supportive care at home     Menses suppression and birth control: previously received Lupron  with last dose 07/26/22   - patient stopped oral contraceptive due to weight loss. Counseled on importance of birth control on TKI. She is abstinent and we have previously discused making an appointment with GYN  Psychosocial distress: She reports a moderate level of anxiety regarding the management of the above.   - Counseling given   - Consider ref to comprehensive cancer support program      Patient-centered care/Shared decision-making:   We discussed the plan above at length. The patient and her husband actively contributed to the conversation. Specifically, her most important outcomes are:   -Prolonging overall survival and Maintaining her overall functional activity Supportive Care Needs: We recommend based on the patient???s underlying diagnosis and treatment history the following supportive care:    RTC in 1 month with labs if transplant is still pending     1. Antimicrobial prophylaxis:    Viral - valacyclovir  and letermovir   Bacterial: Levofloxacin  when ANC <0.5  Fungal: Fluconazole  200mg  when ANC <0.5  PJP: dapsone  100mg  daily  Pt declined flu shot     2. Blood product support:  Leukoreduced blood products are required.  Irradiated blood products are preferred, but in case of urgent transfusion needs non-irradiated blood products may be used: 2 units for Hg <=8.0.      Alyce ONEIDA Shaggy, AGNP       Dr Estelle was available    History of Present Illness:  Hematology/Oncology History Overview Note   Referring/Local Oncologist:    Diagnosis:   10/29/2021  Bone marrow, left iliac, aspiration and biopsy  -  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 The majority of the abnormal cells (64/88) contained an additional BCR/ABL1 fusion signal, while 9/88 abnormal cells contained an additional ABL1 and ASS1 signal.       Genetics:   Karyotype/FISH:   RESULTS   Date Value Ref Range Status   10/28/2021   Final    NOTE: This report reflects a combined study from a peripheral blood and a bone marrow core biopsy. Eleven cells from the peripheral blood and nine cells from the bone marrow core biopsy were analyzed. The BCR/ABL1 FISH analysis was performed on the peripheral blood.     Abnormal Karyotype:  47,XX,t(9;22)(q34;q11.2),+der(22)t(9;22)[18]/46,XX[2]    Abnormal FISH:  A BCR/ABL1 interphase FISH assay showed a signal pattern consistent with a BCR::ABL1 rearrangement and the 9;22 translocation in 88% of the 100 cells scored. The majority of the abnormal cells (64/88) contained an additional BCR/ABL1 fusion signal, while 9/88 abnormal cells contained an additional ABL1 and ASS1 signal.           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  MRD by flow insufficient, p190 2/100,000  12/10/2021: Cycle 2 B cycle + rituximab   12/14/21: IT#4  12/17/21: IT#5  01/10/22: Cycle 3 A cycle + rituximab   01/11/22: IT #6  01/24/22: Bmbx - MRD-neg by PCR (p190)   02/17/22: C4 B cycle + ritux  02/18/22: IT#7  03/21/22: C5 A cycle + ritux (4th dose)  03/22/22: IT#8  04/29/22: Bmbx - MRD-neg by PCR (p190)  05/18/22: restart dasatinib  70mg  s/p neutropenia  05/26/22: Start maintenance vincristine  2mg , Prednisone  200mg  D1-5   06/02/22: IT #9  06/27/22: C2 Maintenance: vincristine  2mg , Prednisone  200mg  D1-5, HOLD dasatinib  due to neutropenia  07/04/22: RESTART dasatinib  70mg   07/21/22: IT #10  07/26/22: C3 Maintenance: vincristine  2mg , Prednisone  200mg  D1-5, dasatinib  70 mg  08/08/22: IT #11 - prophylaxis completed  08/23/22: C4 Maintenance: vincristine  2mg , Prednisone  200mg  D1-5, dasatinib  70 mg  09/20/22: C5 Maintenance: vincristine  2mg , Prednisone  200mg  D1-5, dasatinib  70 mg  10/04/22: Bone marrow biopsy - continued remission, MRD and BCR/ABL p190 negative  10/18/22: C6 Maintenance. ANC 0.4, HOLD dasatinib , continue vincristine  2mg  and Prednisone  200mg  D1-5  11/15/22: C7 Maintenance ANC 0.4. HOLD TKI, continue vincristine  2mg  and Prednisone  200mg  D1-5  ~11/30/22: Start ponatinib  15 mg daily  12/13/22: C8 Maintenance ANC 1.2. ponatinib  15mg , vincristine  2mg  D1 and Prednisone  200mg  D1-5  01/10/23: C9 Maintenance Anc 0.7. HOLD ponatinib , continue vincristine  2mg  D1 and Prednisone  200mg  D1-5  01/26/23: ANC 1.7, restart ponatinib  15 mg  02/08/23: C10 Maintenance. ANC is 1.4 - continue ponatinib   03/07/23: C11 Maintenance ANC is 1.1. continue ponatinib   04/06/23: C12 Maintenance  04/12/23: Bmbx - continued remission, MRD-negative, BCR/ABL undetectable  05/02/23: C13 Maintenance  05/30/23: C14 Maintenance  06/27/23: C15 Maintenance PB p210 2/100,000  07/23/23: C16 Maintenance PB p210 2/100,000  08/22/23: C17 Maintenance PB p210 4/100,000  09/19/23: C18 Maintenance PB p210 2/100,000  10/16/23: C19 Maintenance - HOLD prednisone  due to hyperglycemia  10/16/23: Bmbx - CR, MRD-neg by flow, p210 47/100,000   11/01/23: Increased ponatinib  to 30 mg daily  11/26/23: CNS relapse - IT chemo   11/30/23: IT chemo - blasts present   12/01/23: Bmbx - MRD+ disease, 0.14% by flow, p190 241/100,000   12/04/23: IT chemo - blasts present   12/07/23: IT chemo - blasts present   12/07/23: IT chemo - no definitive blasts - 1/8 clear   12/12/23: C1D1 Blinatumomab    12/11/23: IT chemo - no blasts - 2/8 clear  12/18/23: IT chemo - no blasts - 3/8 clear   12/27/23: IT chemo - no blasts - 4/8 clear   01/09/24: IT chemo - no blasts - 5/8 clear   01/15/24: Bmbx - MRD negative by flow and p190   01/24/24: IT chemo   01/25/24: Cycle 2 blinatumomab  + ponatinib  30mg  daily  02/22/24: IT chemo  03/06/24: IT chemo  03/07/24: Cycle 3 blinatumomab  + ponatinib  30 mg daily       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)    (CMS-HCC)   10/29/2021 Initial Diagnosis    B-cell acute lymphoblastic leukemia (ALL) (CMS-HCC)     10/30/2021 - 03/30/2022 Chemotherapy    IP/OP LEUKEMIA GRAAPH-2005 < 60 YO (OP PEGFILGRASTIM  ON DAY 7)  [No description for this plan]     01/06/2022 - 04/07/2022 Endocrine/Hormone Therapy    OP LEUPROLIDE  (LUPRON ) 11.25 MG EVERY 3 MONTHS  Plan Provider: Toribio JONELLE Bunker, MD     05/26/2022 -  Chemotherapy    OP LEUKEMIA VINCRISTINE   vinCRIStine  2 mg IV on day 1     07/26/2022 - 07/26/2022 Endocrine/Hormone Therapy    OP LEUPROLIDE  (LUPRON ) 11.25 MG EVERY 3 MONTHS  Plan Provider: Toribio JONELLE Bunker, MD           History of Present Illness      Patricia [name] is a 48 year old female with [place] positive B cell ALL in second remission who presents for follow-up.    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  - Receiving IVIG infusions every four weeks; most recent dose on April 04, 2024    Laboratory findings  - Stable blood counts: platelets 99, hemoglobin 12.0, white blood cell count 4.2  - Mild elevations in liver enzymes    Recurrent sinusitis and current upper respiratory symptoms  - History of recurrent sinusitis with good response to Augmentin   - Currently experiencing frontal sinus pressure, mild headache, and low-grade fever up to 99.4??F over the past few nights  - No COVID-19 infection (negative test)    Gastrointestinal symptoms  - Recent episode of diarrhea after consuming frostbitten ice cream; resolved without dehydration  - Bowel movements now near normal, occasionally slightly mushy  - Cautious with diet to avoid triggering foods    Musculoskeletal symptoms and migraine prophylaxis  - Muscle aches present  - Uses Tylenol , particularly at night, for relaxation and migraine prevention related to teeth clenching during sleep    Hypertension  - Managed with losartan     Hemorrhoids and anal fissures  - Pre-existing condition, currently stable    Complementary and supportive therapies  - Drinks immune booster tea containing echinacea, rose hips, lavender, and lemon balm      Past Medical History:  Past Medical History[1]    Medications:  Current Medications[2]    Vital Signs:  BSA: There is no height or weight on file to calculate BSA.  Vitals:    04/18/24 1415   BP: 128/95   Pulse: 58   Resp: 18   Temp: 36.7 ??C (98 ??F)   SpO2: 98%         Physical Exam:  Objective:     ECOG Performance Status: 1 - Restricted in physically strenuous activity but ambulatory and able to carry out work of a light or sedentary nature, e.g., light house work, office work      Physical Exam  Well appearing on video and in no distress            GENERAL: Alert, cooperative, well developed, no acute distress  HEENT: Normocephalic, normal oropharynx, moist mucous membranes  CHEST: Clear to auscultation bilaterally, No wheezes, rhonchi, or crackles  CARDIOVASCULAR: Normal heart rate and rhythm, S1 and S2 normal without murmurs  ABDOMEN: Soft, non-tender, non-distended, without organomegaly, Normal bowel sounds  EXTREMITIES: No cyanosis or edema  NEUROLOGICAL: Cranial nerves grossly intact, Moves all extremities without gross motor or sensory deficit      Relevant Laboratory, radiology and pathology results:  Results  LABS  CSF: clear (12/11/2023)    PATHOLOGY  Bone marrow biopsy: [place] chromosome-positive B-cell acute lymphoblastic leukemia (ALL) in complete remission, negative for BCR/ABL1 P190 (01/15/2024)      LABS  - White Blood Cell (WBC) count: 3.2 x 10^9/L  - Hemoglobin (Hb): 10.8 g/dL  - Absolute Neutrophil Count (ANC): 1.5 x 10^9/L      LABS  PLT: 99 (04/18/2024)  Hb: 12.0 (04/18/2024)  WBC: 4.2 (04/18/2024)  ANC: 2.6 (04/18/2024)  Cr: 0.55 (04/18/2024)  AST: 37 (04/18/2024)  ALT: 60 (04/18/2024)  Respiratory Pathogen Panel: negative (04/18/2024)    DIAGNOSTIC  Echocardiogram:  prolonged QTc (02/16/2024)    PATHOLOGY  BCR/ABL1 P210: undetectable in bone marrow (01/15/2024)  BCR/ABL1 P190: undetectable in bone marrow (01/15/2024)  BCR/ABL1 P190: undetectable in peripheral blood (03/07/2024)    Recent Results (from the past 24 hours)   Comprehensive Metabolic Panel    Collection Time: 04/18/24  1:43 PM   Result Value Ref Range    Sodium 143 135 - 145 mmol/L    Potassium 3.8 3.4 - 4.8 mmol/L    Chloride 104 98 - 107 mmol/L    CO2 26.0 20.0 - 31.0 mmol/L    Anion Gap 13 5 - 14 mmol/L    BUN 16 9 - 23 mg/dL    Creatinine 9.44 9.44 - 1.02 mg/dL    BUN/Creatinine Ratio 29     eGFR CKD-EPI (2021) Female >90 >=60 mL/min/1.64m2    Glucose 173 70 - 179 mg/dL    Calcium  9.4 8.7 - 10.4 mg/dL    Albumin 3.9 3.4 - 5.0 g/dL    Total Protein 7.5 5.7 - 8.2 g/dL    Total Bilirubin 0.5 0.3 - 1.2 mg/dL    AST 37 (H) <=65 U/L    ALT 60 (H) 10 - 49 U/L    Alkaline Phosphatase 74 46 - 116 U/L   Magnesium  Level    Collection Time: 04/18/24  1:43 PM   Result Value Ref Range    Magnesium  1.8 1.6 - 2.6 mg/dL   Phosphorus    Collection Time: 04/18/24  1:43 PM   Result Value Ref Range    Phosphorus 4.3 2.4 - 5.1 mg/dL   C-reactive protein    Collection Time: 04/18/24  1:43 PM   Result Value Ref Range    CRP 7.4 <=10.0 mg/L   CBC w/ Differential    Collection Time: 04/18/24  1:43 PM   Result Value Ref Range    WBC 4.2 3.6 - 11.2 10*9/L    RBC 3.61 (L) 3.95 - 5.13 10*12/L    HGB 12.0 11.3 - 14.9 g/dL    HCT 66.0 (L) 65.9 - 44.0 %    MCV 93.8 77.6 - 95.7 fL    MCH 33.2 (H) 25.9 - 32.4 pg    MCHC 35.4 32.0 - 36.0 g/dL    RDW 85.1 87.7 - 84.7 %    MPV 10.7 6.8 - 10.7 fL    Platelet 99 (L) 150 - 450 10*9/L    Neutrophils % 60.5 %    Lymphocytes % 26.1 %    Monocytes % 10.1 %    Eosinophils % 2.6 %    Basophils % 0.7 %    Absolute Neutrophils 2.6 1.8 - 7.8 10*9/L    Absolute Lymphocytes 1.1 1.1 - 3.6 10*9/L    Absolute Monocytes 0.4 0.3 - 0.8 10*9/L    Absolute Eosinophils 0.1 0.0 - 0.5 10*9/L    Absolute Basophils 0.0 0.0 - 0.1 10*9/L   Respiratory Pathogen Panel with COVID-19    Collection Time: 04/18/24  3:03 PM   Result Value Ref Range    Adenovirus Not Detected Not Detected    Coronavirus HKU1 Not Detected Not Detected Coronavirus NL63 Not Detected Not Detected    Coronavirus 229E Not Detected Not Detected    Coronavirus OC43 PCR Not Detected Not Detected    Metapneumovirus Not Detected Not Detected    Rhinovirus/Enterovirus Not Detected Not Detected    Influenza A Not Detected Not Detected    Influenza B Not Detected Not Detected    Parainfluenza 1  Not Detected Not Detected    Parainfluenza 2 Not Detected Not Detected    Parainfluenza 3 Not Detected Not Detected    Parainfluenza 4 Not Detected Not Detected    RSV Not Detected Not Detected    Bordetella pertussis Not Detected Not Detected    Bordetella parapertussis Not Detected Not Detected    Chlamydophila (Chlamydia) pneumoniae Not Detected Not Detected    Mycoplasma pneumoniae Not Detected Not Detected    SARS-CoV-2 PCR Not Detected Not Detected                                [1]   Past Medical History:  Diagnosis Date    Anxiety     B-cell acute lymphoblastic leukemia (ALL)    (CMS-HCC) 10/29/2021    Cytomegalovirus (CMV) viremia    (CMS-HCC)     Diabetes mellitus    (CMS-HCC)     Hypertension    [2]   Current Outpatient Medications   Medication Sig Dispense Refill    acetaminophen  (TYLENOL  8 HOUR) 650 MG CR tablet Take 2 tablets (1,300 mg total) by mouth every eight (8) hours as needed for pain.      albuterol  HFA 90 mcg/actuation inhaler Inhale 2 puffs every six (6) hours as needed for wheezing. 8 g 1    aspirin  (ECOTRIN) 81 MG tablet Take 1 tablet (81 mg total) by mouth daily.      blood sugar diagnostic (GLUCOSE BLOOD) Strp Use to check blood sugar two times a day. 100 each 0    blood-glucose meter kit Use to check blood sugar two times a day. Use as instructed. 1 each 0    blood-glucose sensor (DEXCOM G7 SENSOR) Devi 1 each by Miscellaneous route every ten (10) days. 9 each 3    blood-glucose,receiver,cont (DEXCOM G7 RECEIVER) Misc 1 each by Miscellaneous route once for 1 dose. 1 each 0    cetirizine  (ZYRTEC ) 10 MG tablet Take 1 tablet (10 mg total) by mouth nightly. 90 tablet 3    glucagon  spray 3 mg/actuation Spry Use 1 spray in 1 nostril for severe hypoglycemia, as per package instructions 1 each 2    insulin  aspart (NOVOLOG  U-100 INSULIN  ASPART) 100 unit/mL vial Inject 0.08 mL (8 Units total) under the skin Three (3) times a day before meals. 10 mL 3    insulin  glargine (LANTUS  SOLOSTAR U-100 INSULIN ) 100 unit/mL (3 mL) injection pen Inject 0.22 mL (22 Units total) under the skin nightly. 3 mL 10    lancets Misc Use to check blood sugar as directed 2 times a day & for symptoms of high or low blood sugar. 100 each 0    letermovir  (PREVYMIS ) 480 mg tablet Take 1 tablet (480 mg total) by mouth daily. 28 tablet 5    PONATinib  (ICLUSIG ) 30 mg tablet Take 1 tablet (30 mg total) by mouth daily. Swallow tablets whole. Do not crush, break, cut or chew tablets. 30 tablet 5    valACYclovir  (VALTREX ) 500 MG tablet Take 1 tablet (500 mg total) by mouth daily. 30 tablet 12    amoxicillin -clavulanate (AUGMENTIN ) 875-125 mg per tablet Take 1 tablet by mouth two (2) times a day for 10 days. 20 tablet 0    dapsone  100 MG tablet Take 1 tablet (100 mg total) by mouth daily. TAKE 1 TABLET(100 MG) BY MOUTH DAILY 30 tablet 11    escitalopram  oxalate (LEXAPRO ) 10 MG tablet Take 1  tablet (10 mg total) by mouth daily. 90 tablet 2    losartan  (COZAAR ) 50 MG tablet Take 1 tablet (50 mg total) by mouth in the morning. 100 tablet 3     No current facility-administered medications for this visit.

## 2024-04-19 NOTE — Unmapped (Signed)
 I saw and evaluated the patient, participating in the key portions of the service.  I reviewed the resident???s note.  I agree with the resident???s findings and plan.     Jettie Pagan, MD

## 2024-04-19 NOTE — Unmapped (Signed)
 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 We also addressed your recurrent sinusitis, heart condition, and hypertension.    YOUR PLAN:    [place] CHROMOSOME-POSITIVE B-CELL ACUTE LYMPHOBLASTIC LEUKEMIA (PH+ B-ALL): Your leukemia is in secondary remission with no detectable disease markers. You are currently on cycle 4 of blinatumomab  and ponatinib , and we are preparing for an allogeneic stem cell transplant.  -Continue taking ponatinib  30 mg daily.  -Start cycle 4 of blinatumomab .  -Coordinate with the transplant team for your allogeneic stem cell transplant.  -Consider stopping blinatumomab  early if the transplant date is finalized before the cycle is complete.  -Plan for lumbar punctures with intrathecal chemotherapy at the end of cycle 4, on days 29 and 36, if not transitioning to transplant sooner.  -Monitor your lab results for any signs of pancytopenia.  -Continue supportive care as per standard protocols for transfusion support and infection prevention.  -Continue IVIG infusions every four weeks until the transplant.    RECURRENT ACUTE SINUSITIS: You have recurrent sinusitis with symptoms of nasal congestion, frontal headache, and low-grade fever. Your COVID-19 test was negative.  -Start taking Augmentin  twice a day for 10 days.  -We will perform a nasal swab for a respiratory pathogen panel.    HEART FAILURE WITH MILDLY REDUCED EJECTION FRACTION (HFMREF) AND PROLONGED QTC: You have a heart condition with mildly reduced ejection fraction and prolonged QTc interval. We are monitoring your heart function and medication effects.  -Continue taking losartan .  -Follow up with cardiology.  -Repeat your echocardiogram next week.  -Monitor your QTc interval and adjust medications as needed.    HYPERTENSION: Your high blood pressure is being managed with losartan . Ponatinib  may contribute to your hypertension.  -Continue taking losartan .  -Coordinate with cardiology for managing your hypertension.

## 2024-04-21 DIAGNOSIS — C9100 Acute lymphoblastic leukemia not having achieved remission: Principal | ICD-10-CM

## 2024-04-21 MED ORDER — CETIRIZINE 10 MG TABLET
ORAL_TABLET | Freq: Every evening | ORAL | 3 refills | 90.00000 days | Status: CP
Start: 2024-04-21 — End: 2025-04-21

## 2024-04-23 DIAGNOSIS — C9100 Acute lymphoblastic leukemia not having achieved remission: Principal | ICD-10-CM

## 2024-04-25 ENCOUNTER — Inpatient Hospital Stay: Admit: 2024-04-25 | Discharge: 2024-04-26 | Payer: PRIVATE HEALTH INSURANCE

## 2024-04-25 DIAGNOSIS — C9100 Acute lymphoblastic leukemia not having achieved remission: Principal | ICD-10-CM

## 2024-04-25 LAB — COMPREHENSIVE METABOLIC PANEL
ALBUMIN: 3.7 g/dL (ref 3.4–5.0)
ALKALINE PHOSPHATASE: 70 U/L (ref 46–116)
ALT (SGPT): 27 U/L (ref 10–49)
ANION GAP: 13 mmol/L (ref 5–14)
AST (SGOT): 22 U/L (ref <=34)
BILIRUBIN TOTAL: 0.3 mg/dL (ref 0.3–1.2)
BLOOD UREA NITROGEN: 11 mg/dL (ref 9–23)
BUN / CREAT RATIO: 24
CALCIUM: 9.2 mg/dL (ref 8.7–10.4)
CHLORIDE: 104 mmol/L (ref 98–107)
CO2: 24 mmol/L (ref 20.0–31.0)
CREATININE: 0.45 mg/dL — ABNORMAL LOW (ref 0.55–1.02)
EGFR CKD-EPI (2021) FEMALE: >90 mL/min/1.73m2 (ref >=60)
GLUCOSE RANDOM: 191 mg/dL — ABNORMAL HIGH (ref 70–179)
POTASSIUM: 4 mmol/L (ref 3.4–4.8)
PROTEIN TOTAL: 6.9 g/dL (ref 5.7–8.2)
SODIUM: 141 mmol/L (ref 135–145)

## 2024-04-25 LAB — CBC W/ AUTO DIFF
BASOPHILS ABSOLUTE COUNT: 0 10*9/L (ref 0.0–0.1)
BASOPHILS RELATIVE PERCENT: 0.8 %
EOSINOPHILS ABSOLUTE COUNT: 0.1 10*9/L (ref 0.0–0.5)
EOSINOPHILS RELATIVE PERCENT: 2.9 %
HEMATOCRIT: 33.4 % — ABNORMAL LOW (ref 34.0–44.0)
HEMOGLOBIN: 11.8 g/dL (ref 11.3–14.9)
LYMPHOCYTES ABSOLUTE COUNT: 1.3 10*9/L (ref 1.1–3.6)
LYMPHOCYTES RELATIVE PERCENT: 26 %
MEAN CORPUSCULAR HEMOGLOBIN CONC: 35.3 g/dL (ref 32.0–36.0)
MEAN CORPUSCULAR HEMOGLOBIN: 33.7 pg — ABNORMAL HIGH (ref 25.9–32.4)
MEAN CORPUSCULAR VOLUME: 95.4 fL (ref 77.6–95.7)
MEAN PLATELET VOLUME: 10.6 fL (ref 6.8–10.7)
MONOCYTES ABSOLUTE COUNT: 0.5 10*9/L (ref 0.3–0.8)
MONOCYTES RELATIVE PERCENT: 10.7 %
NEUTROPHILS ABSOLUTE COUNT: 2.9 10*9/L (ref 1.8–7.8)
NEUTROPHILS RELATIVE PERCENT: 59.6 %
PLATELET COUNT: 161 10*9/L (ref 150–450)
RED BLOOD CELL COUNT: 3.51 10*12/L — ABNORMAL LOW (ref 3.95–5.13)
RED CELL DISTRIBUTION WIDTH: 15.7 % — ABNORMAL HIGH (ref 12.2–15.2)
WBC ADJUSTED: 4.8 10*9/L (ref 3.6–11.2)

## 2024-04-25 LAB — PHOSPHORUS: PHOSPHORUS: 3.2 mg/dL (ref 2.4–5.1)

## 2024-04-25 LAB — MAGNESIUM: MAGNESIUM: 2 mg/dL (ref 1.6–2.6)

## 2024-04-25 MED ADMIN — blinatumomab (BLINCYTO) 28 mcg/day in sodium chloride (non-PVC) 0.9 % IVPB (7-day OUTPATIENT CADD infusion): 28 ug/d | INTRAVENOUS | @ 20:00:00 | Stop: 2024-04-25

## 2024-04-25 NOTE — Unmapped (Signed)
 Met with patient.  Provided the patient a packet which included a copy of consent forms and references to read prior to the clearance and consent appointment with Dr. Jeaneen.  All contents of the packet reviewed.  Informed patient that once insurance shara is obtained we will plan to reach out to the donor center for date planning. All questions and concerns addressed.       Total Time Spent: 30 mins  Time spent in direct patient care: 

## 2024-04-25 NOTE — Unmapped (Signed)
 Patient arrived to chair 13.  No complaints noted.  Access of port intact with blood return.   .  Completed and tolerated infusion per orders.      AVS declined, pt left clinic ambulatory.

## 2024-04-26 DIAGNOSIS — Z7682 Awaiting organ transplant status: Principal | ICD-10-CM

## 2024-04-26 DIAGNOSIS — C9100 Acute lymphoblastic leukemia not having achieved remission: Principal | ICD-10-CM

## 2024-04-29 DIAGNOSIS — Z7682 Awaiting organ transplant status: Principal | ICD-10-CM

## 2024-04-29 DIAGNOSIS — C9100 Acute lymphoblastic leukemia not having achieved remission: Principal | ICD-10-CM

## 2024-04-30 DIAGNOSIS — C9100 Acute lymphoblastic leukemia not having achieved remission: Principal | ICD-10-CM

## 2024-04-30 NOTE — Unmapped (Incomplete)
 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    Assessment/Recommendations:    Patricia [name] is a 48 y.o. 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 Her CMV viremia has remained low level however she has demonstrated resolution of fevers with treatment of CMV. Now that her CMV VL has been undetected for many months, we can stop monitoring.     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  #Hemorrhoids + anal fissure 05/05/22    Active Infections  #Fever of unknown origin 05/11/22, likely 2/2 CMV viremia without disease  -10/11 Normal CT A/P, CT chest with mild diffuse bronchial wall thickening  -10/11 CT sinus with mild mucosal thickening   -Bld cx neg, urine culture w 10-50K E.faecalis, not treated  -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  CBC and CMP twice a month    Prophylaxis  Dapsone   Valtrex   Letermovir     Follow up after HCT          Recommendations were communicated via shared medical record.    History of Present Illness:      Source of information includes:  Electronic Medical Records and Discussion with patient     History of Present Illness  Patricia [name] is a 47 year old female preparing for a stem cell transplant who presents for follow-up while on Letermovir .    She is scheduled for a stem cell transplant on November 7th and is currently on Letermovir  for CMV prophylaxis. She reports feeling well.    A few weeks ago, she experienced nausea and vomiting, which she attributes to either a viral illness or consuming ice cream, which she usually avoids due to lactose intolerance. During her parents' visit, she had diarrhea. She had a mild fever ranging from 99.1??F to 99.4??F, which she attributed to a sinus infection. She completed a 10-day course of Augmentin , which she reports is the only thing that helps her sinus infections. She reports that she cannot take amoxicillin  anymore because she has been on it so many times that it no longer works for her.    She missed one dose of Dapsone  but has been consistent with her other medications, including Letermovir , Valtrex , and aspirin . She uses a pill organizer for her medications.    She reports chronic night sweats, which have been consistent for many years. Recently, she noticed a bump in her vaginal area, which resolved on its own and was likely an ingrown hair.    She experiences some urinary hesitancy and incomplete bladder emptying when on Blina, but no pain or burning.  She has a history of antibiotic reactions, including anaphylaxis to sulfa drugs, throat swelling with sulfamide, hives with erythromycin, and gastrointestinal discomfort with clindamycin. She takes Valtrex  500 mg once daily and reports that she has not had a blister on her lips since starting it.    No current nausea, vomiting, diarrhea, fevers, chills, or rashes. She reports chronic night sweats and some morning sinus congestion. No new or concerning symptoms.    Allergies:  Allergies   Allergen Reactions    Erythromycin Hives     Other reaction(s): Not available    Other      Pt cant take ibuprofen due to condition.    Sulfa (Sulfonamide Antibiotics) Anaphylaxis     Other reaction(s): Not available    Azithromycin Hives     Per patient report    Clindamycin      Other Reaction(s): Unknown    Metoprolol Other (See Comments)     Other Reaction(s): Edema       Medications:     Current Outpatient Medications:     acetaminophen  (TYLENOL  8 HOUR) 650 MG CR tablet, Take 2 tablets (1,300 mg total) by mouth every eight (8) hours as needed for pain., Disp: , Rfl:     albuterol  HFA 90 mcg/actuation inhaler, Inhale 2 puffs every six (6) hours as needed for wheezing., Disp: 8 g, Rfl: 1    aspirin  (ECOTRIN) 81 MG tablet, Take 1 tablet (81 mg total) by mouth daily., Disp: , Rfl:     blood sugar diagnostic (GLUCOSE BLOOD) Strp, Use to check blood sugar two times a day., Disp: 100 each, Rfl: 0    blood-glucose meter kit, Use to check blood sugar two times a day. Use as instructed., Disp: 1 each, Rfl: 0    blood-glucose sensor (DEXCOM G7 SENSOR) Devi, 1 each by Miscellaneous route every ten (10) days., Disp: 9 each, Rfl: 3    blood-glucose,receiver,cont (DEXCOM G7 RECEIVER) Misc, 1 each by Miscellaneous route once for 1 dose., Disp: 1 each, Rfl: 0    cetirizine  (ZYRTEC ) 10 MG tablet, Take 1 tablet (10 mg total) by mouth nightly., Disp: 90 tablet, Rfl: 3    dapsone  100 MG tablet, Take 1 tablet (100 mg total) by mouth daily. TAKE 1 TABLET(100 MG) BY MOUTH DAILY, Disp: 30 tablet, Rfl: 11    escitalopram  oxalate (LEXAPRO ) 10 MG tablet, Take 1 tablet (10 mg total) by mouth daily., Disp: 90 tablet, Rfl: 2    glucagon  spray 3 mg/actuation Spry, Use 1 spray in 1 nostril for severe hypoglycemia, as per package instructions, Disp: 1 each, Rfl: 2    insulin  aspart (NOVOLOG  U-100 INSULIN  ASPART) 100 unit/mL vial, Inject 0.08 mL (8 Units total) under the skin Three (3) times a day before meals., Disp: 10 mL, Rfl: 3    insulin  glargine (LANTUS  SOLOSTAR U-100 INSULIN ) 100 unit/mL (3 mL) injection pen, Inject 0.22 mL (22 Units total) under the skin nightly., Disp: 3 mL, Rfl: 10    lancets Misc, Use to check blood sugar as directed 2 times a day & for symptoms of high or low blood sugar., Disp: 100 each, Rfl: 0    letermovir  (PREVYMIS ) 480 mg tablet, Take 1 tablet (480 mg total) by mouth daily., Disp: 28 tablet, Rfl: 5    losartan  (COZAAR ) 50 MG tablet, Take 1 tablet (50 mg total) by mouth in the morning., Disp: 100 tablet, Rfl: 3    PONATinib  (ICLUSIG ) 30 mg tablet, Take 1 tablet (30 mg total) by  mouth daily. Swallow tablets whole. Do not crush, break, cut or chew tablets., Disp: 30 tablet, Rfl: 5    valACYclovir  (VALTREX ) 500 MG tablet, Take 1 tablet (500 mg total) by mouth daily., Disp: 30 tablet, Rfl: 12    Current antibiotics:  Letermovir    Valtrex   Dapsone  ppx    Previous antibiotics:  Levo, Cefepime   Flucon while neutropenic  Valcyte      Current/Prior immunomodulators:  Ponatinib  + Blina    Other medications reviewed.       Review of Systems:  All other systems reviewed are negative.     Objective     Vital Signs:  There were no vitals taken for this visit.    Physical Exam:     Const [x]  vital signs above    [x]  NAD, non-toxic appearance []  Chronically ill-appearing, non-distressed        Eyes [x]  Lids normal bilaterally, conjunctiva anicteric and noninjected OU     [] PERRL  [] EOMI        ENMT [x]  Normal appearance of external nose and ears, no nasal discharge        []  MMM, no lesions on lips or gums []  No thrush, leukoplakia, oral lesions  []  Dentition good []  Edentulous []  Dental caries present  []  Hearing normal  []  TMs with good light reflexes bilaterally         Neck [x]  Neck of normal appearance and trachea midline        []  No thyromegaly, nodules, or tenderness   []  Full neck ROM        Lymph []  No LAD in neck     []  No LAD in supraclavicular area     []  No LAD in axillae   []  No LAD in epitrochlear chains     []  No LAD in inguinal areas        CV []  RRR []  No peripheral edema     []  Pedal pulses intact   []  No abnormal heart sounds appreciated   []  Extremities WWP         Resp [x]  Normal WOB at rest    [x]  No breathlessness with speaking, no coughing  []  CTA anteriorly    []  CTA posteriorly          GI []  Normal inspection, NTND   []  NABS     []  No umbilical hernia on exam       []  No hepatosplenomegaly     []  Inspection of perineal and perianal areas normal        GU []  Normal external genitalia     [] No urinary catheter present in urethra   []  No CVA tenderness    []  No tenderness over renal allograft        MSK []  No clubbing or cyanosis of hands       []  No vertebral point tenderness  []  No focal tenderness or abnormalities on palpation of joints in RUE, LUE, RLE, or LLE        Skin [x]  No rashes, lesions, or ulcers of visualized skin     []  Skin warm and dry to palpation         Neuro [x]  Face expression symmetric  []  Sensation to light touch grossly intact throughout    []  Moves extremities equally    [x]  No tremor noted        []  CNs II-XII grossly intact     []  DTRs normal and symmetric throughout []   Gait unremarkable        Psych [x]  Appropriate affect       [x]  Fluent speech         [x]  Attentive, good eye contact  []  Oriented to person, place, time          [x]  Judgment and insight are appropriate             Labs:  Results in Past 30 Days  Result Component Current Result Ref Range Previous Result Ref Range   Absolute Eosinophils 0.1 (04/25/2024) 0.0 - 0.5 10*9/L 0.1 (04/18/2024) 0.0 - 0.5 10*9/L   Absolute Lymphocytes 1.3 (04/25/2024) 1.1 - 3.6 10*9/L 1.1 (04/18/2024) 1.1 - 3.6 10*9/L   Absolute Neutrophils 2.9 (04/25/2024) 1.8 - 7.8 10*9/L 2.6 (04/18/2024) 1.8 - 7.8 10*9/L   Alkaline Phosphatase 70 (04/25/2024) 46 - 116 U/L 74 (04/18/2024) 46 - 116 U/L   ALT 27 (04/25/2024) 10 - 49 U/L 60 (H) (04/18/2024) 10 - 49 U/L   AST 22 (04/25/2024) <=34 U/L 37 (H) (04/18/2024) <=34 U/L   BUN 11 (04/25/2024) 9 - 23 mg/dL 16 (0/81/7974) 9 - 23 mg/dL   Calcium  9.2 (04/25/2024) 8.7 - 10.4 mg/dL 9.4 (0/81/7974) 8.7 - 89.5 mg/dL   Creatinine 9.54 (L) (04/25/2024) 0.55 - 1.02 mg/dL 9.44 (0/81/7974) 9.44 - 1.02 mg/dL   CRP 7.4 (0/81/7974) <=89.9 mg/L Not in Time Range    HGB 11.8 (04/25/2024) 11.3 - 14.9 g/dL 87.9 (0/81/7974) 88.6 - 14.9 g/dL   Magnesium  2.0 (04/25/2024) 1.6 - 2.6 mg/dL 1.8 (0/81/7974) 1.6 - 2.6 mg/dL   Phosphorus 3.2 (0/74/7974) 2.4 - 5.1 mg/dL 4.3 (0/81/7974) 2.4 - 5.1 mg/dL   Platelet 838 (0/74/7974) 150 - 450 10*9/L 99 (L) (04/18/2024) 150 - 450 10*9/L   Potassium 4.0 (04/25/2024) 3.4 - 4.8 mmol/L 3.8 (04/18/2024) 3.4 - 4.8 mmol/L   Total Bilirubin 0.3 (04/25/2024) 0.3 - 1.2 mg/dL 0.5 (0/81/7974) 0.3 - 1.2 mg/dL   WBC 4.8 (0/74/7974) 3.6 - 11.2 10*9/L 4.2 (04/18/2024) 3.6 - 11.2 10*9/L     Labs reviewed: ANC 2900, AST and ALT normal, creatinine normal 0.45 stable, ALC 1.3 stable    Microbiology:  CMV   05/13/22 318  05/18/22 ND  05/26/22 ND  06/02/22 670  06/06/22 223  06/14/22 156  06/22/22 230  06/27/22 492  07/04/22 294   12/5-12/26 Valcyte  900mg  PO BID  07/11/22 113  07/18/22 75  07/26/22 ND   12/26-1/24/24 Valcyte  450mg  PO BID  08/04/22 ND  08/08/22 <35  08/18/22 ND  08/23/22 ND  1//29/24 ND  09/05/22 59  09/12/22 408   09/14/22 Valcyte  900mg  PO BID  09/20/22 <35  09/26/22 <35  10/04/22 <35    10/10/22 147  3/19-->present ND    Imaging:  None new    Serologies:  Lab Results   Component Value Date    CMV IGG Positive (A) 02/16/2024    EBV VCA IgG Antibody Positive (A) 02/16/2024    Hep B Surface Ag Nonreactive 02/16/2024    Hep B S Ab Reactive (A) 11/25/2023    Hep B Surf Ab Quant 276.43 (H) 11/25/2023    Hep B Core Total Ab Nonreactive 02/16/2024    Hepatitis C Ab Nonreactive 02/16/2024    HSV 1 IgG Positive (A) 02/16/2024    HSV 2 IgG Positive (A) 02/16/2024    Varicella IgG Positive 02/16/2024    VZV IgG s/co 11.8 02/16/2024    Toxoplasma Gondii IgG Negative 02/16/2024       Immunizations:  Immunization  History   Administered Date(s) Administered    COVID-19 VACC,MRNA,(PFIZER)(PF) 02/20/2020, 03/12/2020    Covid-19 Vac, (48yr+) (Comirnaty) Mrna Pfizer  05/02/2023    TdaP 02/08/2017, 10/21/2021       Time spent on counseling/coordination of care: 15 Minutes  Total time spent with patient: 46 Minutes      The patient reports they are physically located in [place]  and is currently: at home. I conducted a audio/video visit. I spent  32 on the video call with the patient. I spent an additional 5 minutes on pre- and post-visit activities on the date of service . just as an in-person visit OR time which includes both minutes on video and pre/post minutes to determine the level of service.      :[id number]}  The patient reports they are physically located in [place]  and is currently: at home. I conducted a audio/video visit. I spent  37 on the video call with the patient. I spent an additional 3 minutes on pre- and post-visit activities on the date of service .

## 2024-05-01 ENCOUNTER — Encounter
Admit: 2024-05-01 | Discharge: 2024-05-02 | Payer: PRIVATE HEALTH INSURANCE | Attending: Infectious Disease | Primary: Infectious Disease

## 2024-05-01 DIAGNOSIS — C9100 Acute lymphoblastic leukemia not having achieved remission: Principal | ICD-10-CM

## 2024-05-01 DIAGNOSIS — B259 Cytomegaloviral disease, unspecified: Principal | ICD-10-CM

## 2024-05-02 ENCOUNTER — Inpatient Hospital Stay: Admit: 2024-05-02 | Discharge: 2024-05-02 | Payer: PRIVATE HEALTH INSURANCE

## 2024-05-02 DIAGNOSIS — C9100 Acute lymphoblastic leukemia not having achieved remission: Principal | ICD-10-CM

## 2024-05-02 LAB — CBC W/ AUTO DIFF
BASOPHILS ABSOLUTE COUNT: 0.1 10*9/L (ref 0.0–0.1)
BASOPHILS RELATIVE PERCENT: 1.4 %
EOSINOPHILS ABSOLUTE COUNT: 0.1 10*9/L (ref 0.0–0.5)
EOSINOPHILS RELATIVE PERCENT: 1.5 %
HEMATOCRIT: 34.4 % (ref 34.0–44.0)
HEMOGLOBIN: 11.6 g/dL (ref 11.3–14.9)
LYMPHOCYTES ABSOLUTE COUNT: 1 10*9/L — ABNORMAL LOW (ref 1.1–3.6)
LYMPHOCYTES RELATIVE PERCENT: 28.2 %
MEAN CORPUSCULAR HEMOGLOBIN CONC: 33.8 g/dL (ref 32.0–36.0)
MEAN CORPUSCULAR HEMOGLOBIN: 31.9 pg (ref 25.9–32.4)
MEAN CORPUSCULAR VOLUME: 94.4 fL (ref 77.6–95.7)
MEAN PLATELET VOLUME: 11.1 fL — ABNORMAL HIGH (ref 6.8–10.7)
MONOCYTES ABSOLUTE COUNT: 0.5 10*9/L (ref 0.3–0.8)
MONOCYTES RELATIVE PERCENT: 13.3 %
NEUTROPHILS ABSOLUTE COUNT: 2 10*9/L (ref 1.8–7.8)
NEUTROPHILS RELATIVE PERCENT: 55.6 %
PLATELET COUNT: 99 10*9/L — ABNORMAL LOW (ref 150–450)
RED BLOOD CELL COUNT: 3.64 10*12/L — ABNORMAL LOW (ref 3.95–5.13)
RED CELL DISTRIBUTION WIDTH: 15.2 % (ref 12.2–15.2)
WBC ADJUSTED: 3.6 10*9/L (ref 3.6–11.2)

## 2024-05-02 LAB — COMPREHENSIVE METABOLIC PANEL
ALBUMIN: 3.9 g/dL (ref 3.4–5.0)
ALKALINE PHOSPHATASE: 62 U/L (ref 46–116)
ALT (SGPT): 22 U/L (ref 10–49)
ANION GAP: 13 mmol/L (ref 5–14)
AST (SGOT): 25 U/L (ref <=34)
BILIRUBIN TOTAL: 0.5 mg/dL (ref 0.3–1.2)
BLOOD UREA NITROGEN: 15 mg/dL (ref 9–23)
BUN / CREAT RATIO: 29
CALCIUM: 9.5 mg/dL (ref 8.7–10.4)
CHLORIDE: 103 mmol/L (ref 98–107)
CO2: 24 mmol/L (ref 20.0–31.0)
CREATININE: 0.51 mg/dL — ABNORMAL LOW (ref 0.55–1.02)
EGFR CKD-EPI (2021) FEMALE: >90 mL/min/1.73m2 (ref >=60)
GLUCOSE RANDOM: 203 mg/dL — ABNORMAL HIGH (ref 70–179)
POTASSIUM: 3.7 mmol/L (ref 3.4–4.8)
PROTEIN TOTAL: 6.7 g/dL (ref 5.7–8.2)
SODIUM: 140 mmol/L (ref 135–145)

## 2024-05-02 LAB — MAGNESIUM: MAGNESIUM: 1.8 mg/dL (ref 1.6–2.6)

## 2024-05-02 LAB — C-REACTIVE PROTEIN: C-REACTIVE PROTEIN: <5 mg/L (ref <=10.0)

## 2024-05-02 LAB — PHOSPHORUS: PHOSPHORUS: 3.8 mg/dL (ref 2.4–5.1)

## 2024-05-02 MED ADMIN — blinatumomab (BLINCYTO) 28 mcg/day in sodium chloride (non-PVC) 0.9 % IVPB (7-day OUTPATIENT CADD infusion): 28 ug/d | INTRAVENOUS | @ 20:00:00 | Stop: 2024-05-02

## 2024-05-02 NOTE — Unmapped (Signed)
 [name]  MRN: [id number]  Encounter Date: 05/02/2024    Vital signs:  Vitals:    05/02/24 1424   BP: 125/94   Pulse: 85   Resp: 16   Temp: 36.2 ??C (97.2 ??F)   TempSrc: Temporal   SpO2: 95%   Weight: 81.4 kg (179 lb 5.5 oz)       CRS:   Fever: No.  Rash: No.  Myalgias: No.  Hypotension: No.  Hypoxia: No.  Nausea and/or vomiting: No.     Neurotoxicity:   Immune Effector Cell-Associated Encephalopathy (ICE) Score  Orientation Year, Month, City, Hospital 4/4 Points   Naming Ability to name 3 objects (eg point to clock, pen, button) 3/3 Points   Follow Commands Ability to follow simple commands (eg ???Show me 2 fingers,??? ???Close your eyes and stick out your tongue.??? 1/1 [employer] Ability to write a standard sentence (eg ???Our national bird is the Human resources officer??? 1/1 Point   Attention Ability to count backwards from 100 by 10 1/1 Point      Total 10/10 points           Ok to proceed with blinatumomab  infusion today as long as all other treatment conditions have been met.     Ichiro Chesnut [place] Daronte Shostak, ANP

## 2024-05-02 NOTE — Unmapped (Signed)
 Patient arrived to chair 17 for C4 D15 infusion. Medial port's blood sluggish. Medial port removed. Lateral port accessed and 10ml of fluid drawn from lateral port. BR return brisk. Patient connected to CADD pump infusing. Patient discharged with no additional needs, AVS declined.

## 2024-05-02 NOTE — Unmapped (Signed)
 RED ZONE Means: RED ZONE: Take action now!     You need to be seen right away  Symptoms are at a severe level of discomfort    Call 911 or go to your nearest  Hospital for help     - Bleeding that will not stop    - Hard to breathe    - New seizure - Chest pain  - Fall or passing out  -Thoughts of hurting    yourself or others      Call 911 if you are going into the RED ZONE                  YELLOW ZONE Means:     Please call with any new or worsening symptom(s), even if not on this list.  Call [phone]  After hours, weekends, and holidays - you will reach a long recording with specific instructions, If not in an emergency such as above, please listen closely all the way to the end and choose the option that relates to your need.   You can be seen by a provider the same day through our Same Day Acute Care for Patients with Cancer program.      YELLOW ZONE: Take action today     Symptoms are new or worsening  You are not within your goal range for:    - Pain    - Shortness of breath    - Bleeding (nose, urine, stool, wound)    - Feeling sick to your stomach and throwing up    - Mouth sores/pain in your mouth or throat    - Hard stool or very loose stools (increase in       ostomy output)    - No urine for 12 hours    - Feeding tube or other catheter/tube issue    - Redness or pain at previous IV or port/catheter site    - Depressed or anxiety   - Swelling (leg, arm, abdomen,     face, neck)  - Skin rash or skin changes  - Wound issues (redness, drainage,    re-opened)  - Confusion  - Vision changes  - Fever >100.4 F or chills  - Worsening cough with mucus that is    green, yellow, or bloody  - Pain or burning when going to the    bathroom  - Home Infusion Pump Issue- call    [phone]         Call your healthcare provider if you are going into the YELLOW ZONE     GREEN ZONE Means:  Your symptoms are under controls  Continue to take your medicine as ordered  Keep all visits to the provider GREEN ZONE: You are in control  No increase or worsening symptoms  Able to take your medicine  Able to drink and eat    - DO NOT use MyChart messages to report red or yellow symptoms. Allow up to 3    business days for a reply.  -MyChart is for non-urgent medication refills, scheduling requests, or other general questions.         [id number] Rev. 01/28/2022  Approved by Oncology Patient Education Committee     Hospital Outpatient Visit on 05/02/2024   Component Date Value Ref Range Status    Sodium 05/02/2024 140  135 - 145 mmol/L Final    Potassium 05/02/2024 3.7  3.4 - 4.8 mmol/L Final    Chloride 05/02/2024 103  98 - 107  mmol/L Final    CO2 05/02/2024 24.0  20.0 - 31.0 mmol/L Final    Anion Gap 05/02/2024 13  5 - 14 mmol/L Final    BUN 05/02/2024 15  9 - 23 mg/dL Final    Creatinine 89/97/7974 0.51 (L)  0.55 - 1.02 mg/dL Final    BUN/Creatinine Ratio 05/02/2024 29   Final    eGFR CKD-EPI (2021) Female 05/02/2024 >90  >=60 mL/min/1.68m2 Final    eGFR calculated with CKD-EPI 2021 equation in accordance with [employer] and [employer] of Nephrology Task Force recommendations.    Glucose 05/02/2024 203 (H)  70 - 179 mg/dL Final    Calcium  05/02/2024 9.5  8.7 - 10.4 mg/dL Final    Albumin 89/97/7974 3.9  3.4 - 5.0 g/dL Final    Total Protein 05/02/2024 6.7  5.7 - 8.2 g/dL Final    Total Bilirubin 05/02/2024 0.5  0.3 - 1.2 mg/dL Final    AST 89/97/7974 25  <=34 U/L Final    ALT 05/02/2024 22  10 - 49 U/L Final    Alkaline Phosphatase 05/02/2024 62  46 - 116 U/L Final    Magnesium  05/02/2024 1.8  1.6 - 2.6 mg/dL Final    Phosphorus 89/97/7974 3.8  2.4 - 5.1 mg/dL Final    CRP 89/97/7974 <5.0  <=10.0 mg/L Final    WBC 05/02/2024 3.6  3.6 - 11.2 10*9/L Final    RBC 05/02/2024 3.64 (L)  3.95 - 5.13 10*12/L Final    HGB 05/02/2024 11.6  11.3 - 14.9 g/dL Final    HCT 89/97/7974 34.4  34.0 - 44.0 % Final    MCV 05/02/2024 94.4  77.6 - 95.7 fL Final    MCH 05/02/2024 31.9  25.9 - 32.4 pg Final    MCHC 05/02/2024 33.8  32.0 - 36.0 g/dL Final    RDW 89/97/7974 15.2  12.2 - 15.2 % Final    MPV 05/02/2024 11.1 (H)  6.8 - 10.7 fL Final    Platelet 05/02/2024 99 (L)  150 - 450 10*9/L Final    Neutrophils % 05/02/2024 55.6  % Final    Lymphocytes % 05/02/2024 28.2  % Final    Monocytes % 05/02/2024 13.3  % Final    Eosinophils % 05/02/2024 1.5  % Final    Basophils % 05/02/2024 1.4  % Final    Absolute Neutrophils 05/02/2024 2.0  1.8 - 7.8 10*9/L Final    Absolute Lymphocytes 05/02/2024 1.0 (L)  1.1 - 3.6 10*9/L Final    Absolute Monocytes 05/02/2024 0.5  0.3 - 0.8 10*9/L Final    Absolute Eosinophils 05/02/2024 0.1  0.0 - 0.5 10*9/L Final    Absolute Basophils 05/02/2024 0.1  0.0 - 0.1 10*9/L Final

## 2024-05-03 DIAGNOSIS — C9100 Acute lymphoblastic leukemia not having achieved remission: Principal | ICD-10-CM

## 2024-05-03 MED ORDER — ICLUSIG 30 MG TABLET
ORAL_TABLET | Freq: Every day | ORAL | 5 refills | 30.00000 days
Start: 2024-05-03 — End: ?

## 2024-05-03 NOTE — Unmapped (Signed)
 [facility] Refill Coordination Note    Patricia [name], DOB: [birth year]  Phone: [phone] (home)       All above HIPAA information was verified with patient.         05/02/2024    10:12 PM   Specialty Rx Medication Refill Questionnaire   Which Medications would you like refilled and shipped? Iclusig  & Prevymis    Please list all current allergies: Same as on chart   Have you missed any doses in the last 30 days? No   Have you had any changes to your medication(s) since your last refill? No   How much of each medication do you have remaining at home? (eg. number of tablets, injections, etc.) One week of each   Have you experienced any side effects in the last 30 days? No   Please enter the full address (street address, city, state, zip code) where you would like your medication(s) to be delivered to. [address], [place] [address]   Please specify on which day you would like your medication(s) to arrive. Note: if you need your medication(s) within 3 days, please call the pharmacy to schedule your order at [phone]  05/08/2024   Has your insurance changed since your last refill? No   Would you like a pharmacist to call you to discuss your medication(s)? No   Do you require a signature for your package? (Note: if we are billing Medicare Part B or your order contains a controlled substance, we will require a signature) No   I have been provided my out of pocket cost for my medication and approve the pharmacy to charge the amount to my credit card on file. Yes         Completed refill call assessment today to schedule patient's medication shipment from the [facility] [phone]).  All relevant notes have been reviewed.       Confirmed patient received a Conservation officer, historic buildings and a Surveyor, mining with first shipment. The patient will receive a drug information handout for each medication shipped and additional FDA Medication Guides as required.         REFERRAL TO PHARMACIST     Referral to the pharmacist: Not needed      [facility]     Shipping address confirmed in Epic.     Delivery Scheduled: Yes, Expected medication delivery date: 05/08/24.  However, Rx request for refills was sent to the provider as there are none remaining.     Medication will be delivered via Next Day Courier to the prescription address in Epic [place].    Patricia [name]   [facility] Specialty Technician

## 2024-05-06 MED ORDER — PONATINIB 30 MG TABLET
ORAL_TABLET | Freq: Every day | ORAL | 5 refills | 30.00000 days | Status: CP
Start: 2024-05-06 — End: ?
  Filled 2024-05-07: qty 30, 30d supply, fill #0

## 2024-05-07 MED FILL — PREVYMIS 480 MG TABLET: ORAL | 28 days supply | Qty: 28 | Fill #2

## 2024-05-09 ENCOUNTER — Inpatient Hospital Stay: Admit: 2024-05-09 | Discharge: 2024-05-09 | Payer: PRIVATE HEALTH INSURANCE

## 2024-05-09 DIAGNOSIS — C9100 Acute lymphoblastic leukemia not having achieved remission: Principal | ICD-10-CM

## 2024-05-09 LAB — CBC W/ AUTO DIFF
BASOPHILS ABSOLUTE COUNT: 0 10*9/L (ref 0.0–0.1)
BASOPHILS RELATIVE PERCENT: 0.9 %
EOSINOPHILS ABSOLUTE COUNT: 0 10*9/L (ref 0.0–0.5)
EOSINOPHILS RELATIVE PERCENT: 2.5 %
HEMATOCRIT: 31.8 % — ABNORMAL LOW (ref 34.0–44.0)
HEMOGLOBIN: 11 g/dL — ABNORMAL LOW (ref 11.3–14.9)
LYMPHOCYTES ABSOLUTE COUNT: 1 10*9/L — ABNORMAL LOW (ref 1.1–3.6)
LYMPHOCYTES RELATIVE PERCENT: 47.5 %
MEAN CORPUSCULAR HEMOGLOBIN CONC: 34.4 g/dL (ref 32.0–36.0)
MEAN CORPUSCULAR HEMOGLOBIN: 32.5 pg — ABNORMAL HIGH (ref 25.9–32.4)
MEAN CORPUSCULAR VOLUME: 94.5 fL (ref 77.6–95.7)
MEAN PLATELET VOLUME: 10.4 fL (ref 6.8–10.7)
MONOCYTES ABSOLUTE COUNT: 0.3 10*9/L (ref 0.3–0.8)
MONOCYTES RELATIVE PERCENT: 16.4 %
NEUTROPHILS ABSOLUTE COUNT: 0.7 10*9/L — ABNORMAL LOW (ref 1.8–7.8)
NEUTROPHILS RELATIVE PERCENT: 32.7 %
PLATELET COUNT: 104 10*9/L — ABNORMAL LOW (ref 150–450)
RED BLOOD CELL COUNT: 3.37 10*12/L — ABNORMAL LOW (ref 3.95–5.13)
RED CELL DISTRIBUTION WIDTH: 15.2 % (ref 12.2–15.2)
WBC ADJUSTED: 2 10*9/L — ABNORMAL LOW (ref 3.6–11.2)

## 2024-05-09 LAB — COMPREHENSIVE METABOLIC PANEL
ALBUMIN: 4 g/dL (ref 3.4–5.0)
ALKALINE PHOSPHATASE: 62 U/L (ref 46–116)
ALT (SGPT): 17 U/L (ref 10–49)
ANION GAP: 14 mmol/L (ref 5–14)
AST (SGOT): 22 U/L (ref <=34)
BILIRUBIN TOTAL: 0.6 mg/dL (ref 0.3–1.2)
BLOOD UREA NITROGEN: 10 mg/dL (ref 9–23)
BUN / CREAT RATIO: 19
CALCIUM: 9.1 mg/dL (ref 8.7–10.4)
CHLORIDE: 107 mmol/L (ref 98–107)
CO2: 23 mmol/L (ref 20.0–31.0)
CREATININE: 0.52 mg/dL — ABNORMAL LOW (ref 0.55–1.02)
EGFR CKD-EPI (2021) FEMALE: >90 mL/min/1.73m2 (ref >=60)
GLUCOSE RANDOM: 142 mg/dL (ref 70–179)
POTASSIUM: 3.6 mmol/L (ref 3.4–4.8)
PROTEIN TOTAL: 6.7 g/dL (ref 5.7–8.2)
SODIUM: 144 mmol/L (ref 135–145)

## 2024-05-09 LAB — MAGNESIUM: MAGNESIUM: 1.9 mg/dL (ref 1.6–2.6)

## 2024-05-09 LAB — PHOSPHORUS: PHOSPHORUS: 3.1 mg/dL (ref 2.4–5.1)

## 2024-05-09 LAB — CMV DNA, QUANTITATIVE, PCR: CMV VIRAL LD: NOT DETECTED

## 2024-05-09 MED ADMIN — blinatumomab (BLINCYTO) 28 mcg/day in sodium chloride (non-PVC) 0.9 % IVPB (7-day OUTPATIENT CADD infusion): 28 ug/d | INTRAVENOUS | @ 18:00:00 | Stop: 2024-05-09

## 2024-05-09 NOTE — Unmapped (Signed)
 Patient arrived ambulatory  for blina bag change and labs. Patient arrived attached to [facility] CADD pump. 10mL blood wasted prior to forward flushing. Labs collected and sent for analysis. Port de accessed and re accessed. Silver disk dressing applied. Blina pump re-connected and re-started, pump reads running. AVS declined Discharged ambulatory to home.

## 2024-05-09 NOTE — Unmapped (Signed)
 RED ZONE Means: RED ZONE: Take action now!     You need to be seen right away  Symptoms are at a severe level of discomfort    Call 911 or go to your nearest  Hospital for help     - Bleeding that will not stop    - Hard to breathe    - New seizure - Chest pain  - Fall or passing out  -Thoughts of hurting    yourself or others      Call 911 if you are going into the RED ZONE                  YELLOW ZONE Means:     Please call with any new or worsening symptom(s), even if not on this list.  Call [phone]  After hours, weekends, and holidays - you will reach a long recording with specific instructions, If not in an emergency such as above, please listen closely all the way to the end and choose the option that relates to your need.   You can be seen by a provider the same day through our Same Day Acute Care for Patients with Cancer program.      YELLOW ZONE: Take action today     Symptoms are new or worsening  You are not within your goal range for:    - Pain    - Shortness of breath    - Bleeding (nose, urine, stool, wound)    - Feeling sick to your stomach and throwing up    - Mouth sores/pain in your mouth or throat    - Hard stool or very loose stools (increase in       ostomy output)    - No urine for 12 hours    - Feeding tube or other catheter/tube issue    - Redness or pain at previous IV or port/catheter site    - Depressed or anxiety   - Swelling (leg, arm, abdomen,     face, neck)  - Skin rash or skin changes  - Wound issues (redness, drainage,    re-opened)  - Confusion  - Vision changes  - Fever >100.4 F or chills  - Worsening cough with mucus that is    green, yellow, or bloody  - Pain or burning when going to the    bathroom  - Home Infusion Pump Issue- call    [phone]         Call your healthcare provider if you are going into the YELLOW ZONE     GREEN ZONE Means:  Your symptoms are under controls  Continue to take your medicine as ordered  Keep all visits to the provider GREEN ZONE: You are in control  No increase or worsening symptoms  Able to take your medicine  Able to drink and eat    - DO NOT use MyChart messages to report red or yellow symptoms. Allow up to 3    business days for a reply.  -MyChart is for non-urgent medication refills, scheduling requests, or other general questions.         [id number] Rev. 01/28/2022  Approved by Oncology Patient Education Committee     Hospital Outpatient Visit on 05/09/2024   Component Date Value Ref Range Status    Sodium 05/09/2024 144  135 - 145 mmol/L Final    Potassium 05/09/2024 3.6  3.4 - 4.8 mmol/L Final    Chloride 05/09/2024 107  98 - 107  mmol/L Final    CO2 05/09/2024 23.0  20.0 - 31.0 mmol/L Final    Anion Gap 05/09/2024 14  5 - 14 mmol/L Final    BUN 05/09/2024 10  9 - 23 mg/dL Final    Creatinine 89/90/7974 0.52 (L)  0.55 - 1.02 mg/dL Final    BUN/Creatinine Ratio 05/09/2024 19   Final    eGFR CKD-EPI (2021) Female 05/09/2024 >90  >=60 mL/min/1.39m2 Final    eGFR calculated with CKD-EPI 2021 equation in accordance with [employer] and [employer] of Nephrology Task Force recommendations.    Glucose 05/09/2024 142  70 - 179 mg/dL Final    Calcium  05/09/2024 9.1  8.7 - 10.4 mg/dL Final    Albumin 89/90/7974 4.0  3.4 - 5.0 g/dL Final    Total Protein 05/09/2024 6.7  5.7 - 8.2 g/dL Final    Total Bilirubin 05/09/2024 0.6  0.3 - 1.2 mg/dL Final    AST 89/90/7974 22  <=34 U/L Final    ALT 05/09/2024 17  10 - 49 U/L Final    Alkaline Phosphatase 05/09/2024 62  46 - 116 U/L Final    Magnesium  05/09/2024 1.9  1.6 - 2.6 mg/dL Final    Phosphorus 89/90/7974 3.1  2.4 - 5.1 mg/dL Final    WBC 89/90/7974 2.0 (L)  3.6 - 11.2 10*9/L Final    RBC 05/09/2024 3.37 (L)  3.95 - 5.13 10*12/L Final    HGB 05/09/2024 11.0 (L)  11.3 - 14.9 g/dL Final    HCT 89/90/7974 31.8 (L)  34.0 - 44.0 % Final    MCV 05/09/2024 94.5  77.6 - 95.7 fL Final    MCH 05/09/2024 32.5 (H)  25.9 - 32.4 pg Final    MCHC 05/09/2024 34.4  32.0 - 36.0 g/dL Final    RDW 89/90/7974 15.2  12.2 - 15.2 % Final    MPV 05/09/2024 10.4  6.8 - 10.7 fL Final    Platelet 05/09/2024 104 (L)  150 - 450 10*9/L Final    Neutrophils % 05/09/2024 32.7  % Final    Lymphocytes % 05/09/2024 47.5  % Final    Monocytes % 05/09/2024 16.4  % Final    Eosinophils % 05/09/2024 2.5  % Final    Basophils % 05/09/2024 0.9  % Final    Absolute Neutrophils 05/09/2024 0.7 (L)  1.8 - 7.8 10*9/L Final    Absolute Lymphocytes 05/09/2024 1.0 (L)  1.1 - 3.6 10*9/L Final    Absolute Monocytes 05/09/2024 0.3  0.3 - 0.8 10*9/L Final    Absolute Eosinophils 05/09/2024 0.0  0.0 - 0.5 10*9/L Final    Absolute Basophils 05/09/2024 0.0  0.0 - 0.1 10*9/L Final

## 2024-05-09 NOTE — Unmapped (Signed)
 1248: Pt here for Blina bag change.

## 2024-05-10 DIAGNOSIS — C9100 Acute lymphoblastic leukemia not having achieved remission: Principal | ICD-10-CM

## 2024-05-10 DIAGNOSIS — E1165 Type 2 diabetes mellitus with hyperglycemia: Principal | ICD-10-CM

## 2024-05-10 DIAGNOSIS — Z794 Long term (current) use of insulin: Principal | ICD-10-CM

## 2024-05-10 MED ORDER — INSULIN ASPART (U-100) 100 UNIT/ML (3 ML) SUBCUTANEOUS PEN
12 refills | 0.00000 days | Status: CP
Start: 2024-05-10 — End: ?

## 2024-05-11 ENCOUNTER — Inpatient Hospital Stay: Admit: 2024-05-11 | Discharge: 2024-05-12 | Payer: PRIVATE HEALTH INSURANCE

## 2024-05-11 NOTE — Unmapped (Signed)
 Pt presented to infusion center because Blincyto  CADD pump beeping from air in the line. Line troubleshooted and Blincyto  disconnected from pt and primed through pump to push air bubbles out of the line. After priming line with drug, air bubbles were gone. Pt monitored for 10 minutes with no further issues. Discharged home with pump infusing as ordered. Ambulatory and in NAD at time of discharge.

## 2024-05-11 NOTE — Unmapped (Signed)
 Upcoming Appt:  Future Appointments   Date Time Provider Department Center   05/16/2024  8:30 AM ONCINF CHAIR 19 [facility]C3U[facility]   05/17/2024  1:00 PM ADULT ONC LAB UNCCALAB [facility]   05/17/2024  2:00 PM Belen Ronal Norris, [place] [facility]   05/21/2024 11:15 AM ONCBMT LABS HONCBMT [facility]   05/21/2024 12:00 PM ONCBMT PROCEDURES [facility]   05/23/2024  7:45 AM ONCBMT LABS HONCBMT [facility]   05/23/2024  8:00 AM ONCBMT NURSE [facility]ON[facility]   05/23/2024  8:30 AM ONCINF CHAIR 23 [facility]   05/23/2024 11:00 AM ONCBMT COORDINATOR HONCBMT [facility]   05/23/2024  1:00 PM UNCW DIAG RM 4 IDUW [place]   05/23/2024  1:30 PM CARSER EKG HVCARD2UMH [facility]   05/23/2024  2:00 PM Helen PFT 3 [id number] [facility]   05/23/2024  3:15 PM [facility] ECHO RM 1 IE[facility] [place]   05/24/2024  9:00 AM Reilly, Jaycee A UNCDIABENDET [facility]   05/24/2024 12:00 PM ADULT ONC LAB [facility]   05/24/2024  2:00 PM Belen Ronal Norris, [place] H[facility]C3[facility]   05/29/2024 12:00 PM ADULT ONC LAB [facility]   05/29/2024  1:00 PM Estelle Toribio SAUNDERS, MD HONC2[facility]   05/29/2024  2:00 PM [name], MD HONCBMT [facility]   05/29/2024  3:00 PM ONCBMT COORDINATOR HON[facility]MT [facility]   05/30/2024  7:30 AM ONCINF CHAIR 16 [facility]   05/31/2024 10:30 AM Polly Alyce Dover, MD ONCMULTI [facility]   06/05/2024 10:15 AM ONCBMT LABS HONCBMT [facility]   06/05/2024 11:00 AM ONCBMT APP A [facility]ON[facility]   06/05/2024 12:00 PM ONCBMT PHARMACY HON[facility]   06/05/2024  1:00 PM Elihu Mliss BRAVO, RD/LDN DIETUCA [facility]   06/07/2024  8:00 AM NELS IR - DIALYSIS RM 1 IMGIRNEL [place] [facility] [place]   06/25/2024 11:10 AM Sanda Arlean HERO, CPP UNCDIABENDET [facility]   06/28/2024  8:30 AM ONCINF CHAIR 16 [facility]   07/10/2024 12:00 PM ONCBMT LABS HONCBMT [facility]   07/10/2024  1:00 PM [name], MD HONCBMT [facility]   07/26/2024  8:30 AM ONCINF CHAIR 14 [facility]   08/13/2024  9:30 AM ONCBMT LABS HONCBMT [facility]   08/13/2024 10:30 AM [name], MD HONCBMT [facility]   08/13/2024  1:25 PM Delores Fleeting, MD [place] [facility]   08/28/2024  9:40 AM Storm Honer, MD UNCDIABENDET [facility]   09/10/2024 11:30 AM ONCBMT LABS HONCBMT [facility]   09/10/2024 12:30 PM [name], MD HONCBMT [facility]       Disposition:  Urgent Home Treatment With Follow-up Call    Encounter Reason for Disposition:    [1] Central line (e.g., [place], [place], Port) not running or running slowly AND [2] has not tried Care Advice      Is this a pediatric patient?   No     Any recent, relevant visit?   Yes     Date/location of recent visit?  10/9 infusion started and finishes 10/16    Any related medications?  No    Any relevant medical history?   Yes     What history?  [place] chromosome with acute lymphoblastic leukemia    Any interventions?   No      [facility] Eligibility    Patient is eligible for [facility] (according  to most recent zip code and insurance information)?  No    Patient is [facility] eligible and appropriate for referral: No     Encounter Initial Assessment:  1. MAIN CONCERN: What is your main concern or question today?      Air in line and pump is beeping, can not get it to restart. Can not get anyone in touch with anyone at the clinic. Calls into the pump company and pharmacy, refusing to go to the ER  2. ONSET: When did the problem start?      Today  3. IV FUNCTION: Describe how the IV is running? (e.g., running normally, running slowly, not running, unable to flush)       Not running  4. IV TYPE: What kind of IV line do you have? (e.g., central line, PICC, peripheral IV)      Central line  5. IV LOCATION - SITE: Where does the IV enter your body?      N/a  6. IV START DATE: When was this IV put in?      Infusion started on 10/9 and runs for a week  7. IV REASON: Why do you have this IV line?      Cancer treatment  8. PAIN: Is there any pain? If Yes, ask: Where is the pain?, How bad is the pain?   (Scale 0-10; or none, mild, moderate, severe) Describe the pain. (e.g., burning, throbbing, shooting, sharp, etc.)      Denies  9. SWELLING: Is there any swelling at your IV site?       denies  10. FEVER: Do you have a fever? If Yes, ask: What is your temperature, how was it measured, and when did it start?         denies  11. OTHER SYMPTOMS: Do you have any other symptoms? (e.g., lump, pus, or redness at IV site, shaking chills)        denies  12. VISITING NURSE: Do you have a visiting nurse? (e.g., home health nurse, IV infusion nurse)        denies  13. PUMP: Is it on a pump? If Yes, ask: Is there an alarm and what is the message?        Yes, pump is beeping and stating air is in the line  Triage RN tried to try multiple numbers to reach infusion help but unable to reach a person. Spoke to [employer] who advised patient would need to go to ER. Patient states with cost, time, and compromised immune system will not go to the ER and will just clamp off and deal with it Monday. Triage RN suggested trying number on the back of the pump again/pharmacy if no response and still can not get help, please call back to verify safety of just stopping the infusion when it is a week long treatment      Encounter Protocols Used:  IV Not Running or Running [facility]

## 2024-05-12 DIAGNOSIS — C9100 Acute lymphoblastic leukemia not having achieved remission: Principal | ICD-10-CM

## 2024-05-13 ENCOUNTER — Ambulatory Visit: Admit: 2024-05-13 | Discharge: 2024-05-14 | Payer: PRIVATE HEALTH INSURANCE

## 2024-05-14 DIAGNOSIS — C9100 Acute lymphoblastic leukemia not having achieved remission: Principal | ICD-10-CM

## 2024-05-16 ENCOUNTER — Inpatient Hospital Stay: Admit: 2024-05-16 | Discharge: 2024-05-16 | Payer: PRIVATE HEALTH INSURANCE

## 2024-05-16 ENCOUNTER — Ambulatory Visit: Admit: 2024-05-16 | Discharge: 2024-05-16 | Payer: PRIVATE HEALTH INSURANCE

## 2024-05-16 DIAGNOSIS — C9100 Acute lymphoblastic leukemia not having achieved remission: Principal | ICD-10-CM

## 2024-05-16 LAB — MANUAL DIFFERENTIAL
BASOPHILS - ABS (DIFF): 0 10*9/L (ref 0.0–0.1)
BASOPHILS - REL (DIFF): 0 %
EOSINOPHILS - ABS (DIFF): 0.1 10*9/L (ref 0.0–0.5)
EOSINOPHILS - REL (DIFF): 7 %
LYMPHOCYTES - ABS (DIFF): 1.3 10*9/L (ref 1.1–3.6)
LYMPHOCYTES - REL (DIFF): 62 %
MONOCYTES - ABS (DIFF): 0.4 10*9/L (ref 0.3–0.8)
MONOCYTES - REL (DIFF): 19 %
NEUTROPHILS - ABS (DIFF): 0.3 10*9/L — CL (ref 1.8–7.8)
NEUTROPHILS - REL (DIFF): 12 %

## 2024-05-16 LAB — CBC W/ AUTO DIFF
HEMATOCRIT: 32.8 % — ABNORMAL LOW (ref 34.0–44.0)
HEMOGLOBIN: 11.3 g/dL (ref 11.3–14.9)
MEAN CORPUSCULAR HEMOGLOBIN CONC: 34.4 g/dL (ref 32.0–36.0)
MEAN CORPUSCULAR HEMOGLOBIN: 32.7 pg — ABNORMAL HIGH (ref 25.9–32.4)
MEAN CORPUSCULAR VOLUME: 95.1 fL (ref 77.6–95.7)
MEAN PLATELET VOLUME: 9.5 fL (ref 6.8–10.7)
PLATELET COUNT: 106 10*9/L — ABNORMAL LOW (ref 150–450)
RED BLOOD CELL COUNT: 3.45 10*12/L — ABNORMAL LOW (ref 3.95–5.13)
RED CELL DISTRIBUTION WIDTH: 15.1 % (ref 12.2–15.2)
WBC ADJUSTED: 2.1 10*9/L — ABNORMAL LOW (ref 3.6–11.2)

## 2024-05-16 MED ORDER — FLUCONAZOLE 200 MG TABLET
ORAL_TABLET | Freq: Every day | ORAL | 0 refills | 30.00000 days | Status: CP
Start: 2024-05-16 — End: 2024-06-15
  Filled 2024-05-16: qty 30, 30d supply, fill #0

## 2024-05-16 MED ORDER — LEVOFLOXACIN 500 MG TABLET
ORAL_TABLET | Freq: Every day | ORAL | 0 refills | 30.00000 days | Status: CP
Start: 2024-05-16 — End: 2024-06-15
  Filled 2024-05-16: qty 30, 30d supply, fill #0

## 2024-05-16 MED ADMIN — acetaminophen (TYLENOL) tablet 650 mg: 650 mg | ORAL | @ 13:00:00 | Stop: 2024-05-16

## 2024-05-16 MED ADMIN — immun glob G(IgG)-pro-IgA 0-50 (PRIVIGEN) 10 % intravenous solution 25 g: .4 g/kg | INTRAVENOUS | @ 14:00:00 | Stop: 2024-05-16

## 2024-05-16 MED ADMIN — diphenhydrAMINE (BENADRYL) capsule/tablet 25 mg: 25 mg | ORAL | @ 13:00:00 | Stop: 2024-05-16

## 2024-05-16 MED ADMIN — heparin, porcine (PF) 100 unit/mL injection 500 Units: 500 [IU] | INTRAVENOUS | @ 15:00:00 | Stop: 2024-05-17

## 2024-05-16 NOTE — Unmapped (Signed)
 Patient arrived to infusion chair 61 for IVIG and dc blina CADD.  Disconnected Blina pump upon arrival, wasted 10 ml, +BR and then forward flushed with NS. Premeds given. Patient completed and tolerated tx well. Port was flushed, +BR, heparin -locked, and de-accessed. Patient discharged with no additional needs, AVS declined.

## 2024-05-16 NOTE — Unmapped (Signed)
 RED ZONE Means: RED ZONE: Take action now!     You need to be seen right away  Symptoms are at a severe level of discomfort    Call 911 or go to your nearest  Hospital for help     - Bleeding that will not stop    - Hard to breathe    - New seizure - Chest pain  - Fall or passing out  -Thoughts of hurting    yourself or others      Call 911 if you are going into the RED ZONE                  YELLOW ZONE Means:     Please call with any new or worsening symptom(s), even if not on this list.  Call [phone]  After hours, weekends, and holidays - you will reach a long recording with specific instructions, If not in an emergency such as above, please listen closely all the way to the end and choose the option that relates to your need.   You can be seen by a provider the same day through our Same Day Acute Care for Patients with Cancer program.      YELLOW ZONE: Take action today     Symptoms are new or worsening  You are not within your goal range for:    - Pain    - Shortness of breath    - Bleeding (nose, urine, stool, wound)    - Feeling sick to your stomach and throwing up    - Mouth sores/pain in your mouth or throat    - Hard stool or very loose stools (increase in       ostomy output)    - No urine for 12 hours    - Feeding tube or other catheter/tube issue    - Redness or pain at previous IV or port/catheter site    - Depressed or anxiety   - Swelling (leg, arm, abdomen,     face, neck)  - Skin rash or skin changes  - Wound issues (redness, drainage,    re-opened)  - Confusion  - Vision changes  - Fever >100.4 F or chills  - Worsening cough with mucus that is    green, yellow, or bloody  - Pain or burning when going to the    bathroom  - Home Infusion Pump Issue- call    [phone]         Call your healthcare provider if you are going into the YELLOW ZONE     GREEN ZONE Means:  Your symptoms are under controls  Continue to take your medicine as ordered  Keep all visits to the provider GREEN ZONE: You are in control  No increase or worsening symptoms  Able to take your medicine  Able to drink and eat    - DO NOT use MyChart messages to report red or yellow symptoms. Allow up to 3    business days for a reply.  -MyChart is for non-urgent medication refills, scheduling requests, or other general questions.         [id number] Rev. 01/28/2022  Approved by Oncology Patient Education Committee     Hospital Outpatient Visit on 05/16/2024   Component Date Value Ref Range Status    WBC 05/16/2024 2.1 (L)  3.6 - 11.2 10*9/L Final    RBC 05/16/2024 3.45 (L)  3.95 - 5.13 10*12/L Final    HGB 05/16/2024 11.3  11.3 -  14.9 g/dL Final    HCT 89/83/7974 32.8 (L)  34.0 - 44.0 % Final    MCV 05/16/2024 95.1  77.6 - 95.7 fL Final    MCH 05/16/2024 32.7 (H)  25.9 - 32.4 pg Final    MCHC 05/16/2024 34.4  32.0 - 36.0 g/dL Final    RDW 89/83/7974 15.1  12.2 - 15.2 % Final    MPV 05/16/2024 9.5  6.8 - 10.7 fL Final    Platelet 05/16/2024 106 (L)  150 - 450 10*9/L Final    Neutrophils % 05/16/2024 12  % Final    Lymphocytes % 05/16/2024 62  % Final    Monocytes % 05/16/2024 19  % Final    Eosinophils % 05/16/2024 7  % Final    Basophils % 05/16/2024 0  % Final    Absolute Neutrophils 05/16/2024 0.3 (LL)  1.8 - 7.8 10*9/L Final    Absolute Lymphocytes 05/16/2024 1.3  1.1 - 3.6 10*9/L Final    Absolute Monocytes 05/16/2024 0.4  0.3 - 0.8 10*9/L Final    Absolute Eosinophils 05/16/2024 0.1  0.0 - 0.5 10*9/L Final    Absolute Basophils 05/16/2024 0.0  0.0 - 0.1 10*9/L Final    Smear Review Comments 05/16/2024 See Comment  Undefined Final    Slide reviewed.  [id number]

## 2024-05-17 ENCOUNTER — Other Ambulatory Visit: Admit: 2024-05-17 | Discharge: 2024-05-17 | Payer: PRIVATE HEALTH INSURANCE

## 2024-05-17 ENCOUNTER — Inpatient Hospital Stay: Admit: 2024-05-17 | Discharge: 2024-05-17 | Payer: PRIVATE HEALTH INSURANCE

## 2024-05-17 DIAGNOSIS — Z7682 Awaiting organ transplant status: Principal | ICD-10-CM

## 2024-05-17 DIAGNOSIS — C9100 Acute lymphoblastic leukemia not having achieved remission: Principal | ICD-10-CM

## 2024-05-17 LAB — CBC W/ AUTO DIFF
HEMATOCRIT: 30.9 % — ABNORMAL LOW (ref 34.0–44.0)
HEMOGLOBIN: 10.7 g/dL — ABNORMAL LOW (ref 11.3–14.9)
MEAN CORPUSCULAR HEMOGLOBIN CONC: 34.7 g/dL (ref 32.0–36.0)
MEAN CORPUSCULAR HEMOGLOBIN: 32.7 pg — ABNORMAL HIGH (ref 25.9–32.4)
MEAN CORPUSCULAR VOLUME: 94.2 fL (ref 77.6–95.7)
MEAN PLATELET VOLUME: 9.9 fL (ref 6.8–10.7)
PLATELET COUNT: 88 10*9/L — ABNORMAL LOW (ref 150–450)
RED BLOOD CELL COUNT: 3.28 10*12/L — ABNORMAL LOW (ref 3.95–5.13)
RED CELL DISTRIBUTION WIDTH: 14.7 % (ref 12.2–15.2)
WBC ADJUSTED: 1.6 10*9/L — ABNORMAL LOW (ref 3.6–11.2)

## 2024-05-17 LAB — MANUAL DIFFERENTIAL
BASOPHILS - ABS (DIFF): 0 10*9/L (ref 0.0–0.1)
BASOPHILS - REL (DIFF): 2 %
EOSINOPHILS - ABS (DIFF): 0.1 10*9/L (ref 0.0–0.5)
EOSINOPHILS - REL (DIFF): 4 %
LYMPHOCYTES - ABS (DIFF): 0.9 10*9/L — ABNORMAL LOW (ref 1.1–3.6)
LYMPHOCYTES - REL (DIFF): 57 %
MONOCYTES - ABS (DIFF): 0.3 10*9/L (ref 0.3–0.8)
MONOCYTES - REL (DIFF): 19 %
NEUTROPHILS - ABS (DIFF): 0.3 10*9/L — CL (ref 1.8–7.8)
NEUTROPHILS - REL (DIFF): 18 %

## 2024-05-17 LAB — HEMATOPATHOLOGY LEUKEMIA/LYMPHOMA FLOW CYTOMETRY, CSF
LYMPHS CSF: 95 %
MONO/MACROPHAGE CSF: 5 %
NUCLEATED CELLS, CSF: 40 ul — ABNORMAL HIGH (ref <=5)
NUMBER OF CELLS CSF: 100
RBC CSF: 36 ul — ABNORMAL HIGH (ref <2)

## 2024-05-17 MED ADMIN — midazolam (VERSED) injection 2 mg: 2 mg | INTRAVENOUS | @ 19:00:00 | Stop: 2024-05-17

## 2024-05-17 MED ADMIN — methotrexate (PRESERVATIVE FREE) 12 mg, hydrocortisone sod succ (Solu-CORTEF) 50 mg in sodium chloride (NS) 0.9 % 6 mL INTRATHECAL syringe: INTRATHECAL | @ 19:00:00 | Stop: 2024-05-17

## 2024-05-17 MED ADMIN — traMADol (ULTRAM) tablet 50 mg: 50 mg | ORAL | @ 19:00:00 | Stop: 2024-05-17

## 2024-05-17 NOTE — Unmapped (Signed)
 Lumbar Puncture Procedure Note    Today's Date: 05/17/2024    Diagnosis: Problem List[1]    Indications:    Diagnosis ICD-10-CM Associated Orders   1. 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)    (CMS-HCC)  C91.00 Notify Provider     Care order/instruction     CSF protein     Glucose, CSF     Hempath Leukemia Flowcytometry, CSF     Physician communication order     OKAY TO SEND MEDICATION/CHEMOTHERAPY TO OUTPATIENT UNIT     methotrexate  (PRESERVATIVE FREE) 12 mg, hydrocortisone  sod succ (Solu-CORTEF ) 50 mg in sodium chloride  (NS) 0.9 % 6 mL INTRATHECAL syringe     Implement     Adult Oxygen therapy     sodium chloride  (NS) 0.9 % infusion     sodium chloride  0.9% (NS) bolus 1,000 mL     diphenhydrAMINE  (BENADRYL ) injection 25 mg     famotidine  (PF) (PEPCID ) injection 20 mg     dexAMETHasone  (DECADRON ) 4 mg/mL injection 20 mg     methylPREDNISolone  sodium succinate  (SOLU-Medrol ) injection 125 mg     EPINEPHrine (EPIPEN) injection 0.3 mg     Notify Provider     Care order/instruction     Physician communication order     Implement     Adult Oxygen therapy          Premedication: Versed  2mg  IV and tramadol  50mg  po    Confirmed Driver: yes    Clinician(s) Performing Procedure:  Ronal Plume, PA-C    Procedure Details     Consent: The risks, benefits, and alternatives to the procedure were discussed. All questions were answered. Consent was obtained and witnessed.    A time-out in which Her patient identifiers were checked by 2 providers was performed.    The patient was positioned under sterile conditions. Betadine solution and sterile drapes were utilized. Local anesthesia with 1% lidocaine  was applied subcutaneously then deep to the skin. A spinal needle was inserted at the L3 - L4 interspace. Spinal fluid was obtained and sent to the laboratory.    Intrathecal MTX was administered over 4 min.    The spinal needle with trocar was removed with minimal bleeding noted upon removal. A sterile bandage was placed over the puncture site after holding pressure.        Findings  6mL of clear spinal fluid was obtained.    Complications:  None; patient tolerated the procedure well.          Condition: stable    Plan  Bed rest for 1 hours.  Tylenol  650 mg for pain if needed.  Call office if you develop a severe headache, nausea, vomiting, or fever greater than 100.5 F.              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

## 2024-05-17 NOTE — Unmapped (Signed)
 Pt arrived to bed 10 in outpatient oncology infusion for LP with intrathecal chemo, accompanied by family member. Pt accessed via port, with positive blood return. Consent obtained. TIME OUT preformed prior to administration of Versed . LP completed, no concerns. 1 hr post observation complete, no concerns. port de accessed with heparin . Pt discharged with family member via wheelchair. AVS declined

## 2024-05-17 NOTE — Unmapped (Signed)
 Please leave dressing in place and keep it dry for 24 hrs before removing. You can resume normal activities tomorrow, but take things easy today.     You may take tylenol  as needed for discomfort at the area where the biopsy was taken.       If you have questions between 8am to 5 pm Monday through Friday please call [phone] and speak to the operator.      For emergencies, evenings or weekends, please call [phone] and ask for oncology fellow on call.     Reasons to call emergency line may include:   Fever of 100.5 or greater   Nausea and/or vomiting not relieved with nausea medicine   Diarrhea or constipation   Severe pain/headache not relieved with usual pain regimen       Lab on 05/17/2024   Component Date Value Ref Range Status    WBC 05/17/2024 1.6 (L)  3.6 - 11.2 10*9/L Preliminary    RBC 05/17/2024 3.28 (L)  3.95 - 5.13 10*12/L Preliminary    HGB 05/17/2024 10.7 (L)  11.3 - 14.9 g/dL Preliminary    HCT 89/82/7974 30.9 (L)  34.0 - 44.0 % Preliminary    MCV 05/17/2024 94.2  77.6 - 95.7 fL Preliminary    MCH 05/17/2024 32.7 (H)  25.9 - 32.4 pg Preliminary    MCHC 05/17/2024 34.7  32.0 - 36.0 g/dL Preliminary    RDW 89/82/7974 14.7  12.2 - 15.2 % Preliminary    MPV 05/17/2024 9.9  6.8 - 10.7 fL Preliminary    Platelet 05/17/2024 88 (L)  150 - 450 10*9/L Preliminary

## 2024-05-18 DIAGNOSIS — C9100 Acute lymphoblastic leukemia not having achieved remission: Principal | ICD-10-CM

## 2024-05-20 DIAGNOSIS — C9100 Acute lymphoblastic leukemia not having achieved remission: Principal | ICD-10-CM

## 2024-05-20 DIAGNOSIS — Z7682 Awaiting organ transplant status: Principal | ICD-10-CM

## 2024-05-21 ENCOUNTER — Inpatient Hospital Stay
Admit: 2024-05-21 | Discharge: 2024-05-21 | Payer: PRIVATE HEALTH INSURANCE | Attending: Critical Care Medicine | Primary: Critical Care Medicine

## 2024-05-21 ENCOUNTER — Other Ambulatory Visit: Admit: 2024-05-21 | Discharge: 2024-05-21 | Payer: PRIVATE HEALTH INSURANCE

## 2024-05-21 DIAGNOSIS — C9100 Acute lymphoblastic leukemia not having achieved remission: Principal | ICD-10-CM

## 2024-05-21 DIAGNOSIS — Z7682 Awaiting organ transplant status: Principal | ICD-10-CM

## 2024-05-21 LAB — COMPREHENSIVE METABOLIC PANEL
ALBUMIN: 3.8 g/dL (ref 3.4–5.0)
ALKALINE PHOSPHATASE: 66 U/L (ref 46–116)
ALT (SGPT): 22 U/L (ref 10–49)
ANION GAP: 14 mmol/L (ref 5–14)
AST (SGOT): 27 U/L (ref <=34)
BILIRUBIN TOTAL: 0.4 mg/dL (ref 0.3–1.2)
BLOOD UREA NITROGEN: 12 mg/dL (ref 9–23)
BUN / CREAT RATIO: 24
CALCIUM: 9 mg/dL (ref 8.7–10.4)
CHLORIDE: 102 mmol/L (ref 98–107)
CO2: 24 mmol/L (ref 20.0–31.0)
CREATININE: 0.51 mg/dL — ABNORMAL LOW (ref 0.55–1.02)
EGFR CKD-EPI (2021) FEMALE: >90 mL/min/1.73m2 (ref >=60)
GLUCOSE RANDOM: 170 mg/dL (ref 70–179)
POTASSIUM: 4 mmol/L (ref 3.4–4.8)
PROTEIN TOTAL: 7.2 g/dL (ref 5.7–8.2)
SODIUM: 140 mmol/L (ref 135–145)

## 2024-05-21 LAB — CBC W/ AUTO DIFF
BASOPHILS ABSOLUTE COUNT: 0 10*9/L (ref 0.0–0.1)
BASOPHILS RELATIVE PERCENT: 1.6 %
EOSINOPHILS ABSOLUTE COUNT: 0.1 10*9/L (ref 0.0–0.5)
EOSINOPHILS RELATIVE PERCENT: 3.6 %
HEMATOCRIT: 32.8 % — ABNORMAL LOW (ref 34.0–44.0)
HEMOGLOBIN: 11.3 g/dL (ref 11.3–14.9)
LYMPHOCYTES ABSOLUTE COUNT: 1.1 10*9/L (ref 1.1–3.6)
LYMPHOCYTES RELATIVE PERCENT: 58.4 %
MEAN CORPUSCULAR HEMOGLOBIN CONC: 34.5 g/dL (ref 32.0–36.0)
MEAN CORPUSCULAR HEMOGLOBIN: 32.5 pg — ABNORMAL HIGH (ref 25.9–32.4)
MEAN CORPUSCULAR VOLUME: 94 fL (ref 77.6–95.7)
MEAN PLATELET VOLUME: 10.1 fL (ref 6.8–10.7)
MONOCYTES ABSOLUTE COUNT: 0.1 10*9/L — ABNORMAL LOW (ref 0.3–0.8)
MONOCYTES RELATIVE PERCENT: 7.7 %
NEUTROPHILS ABSOLUTE COUNT: 0.5 10*9/L — ABNORMAL LOW (ref 1.8–7.8)
NEUTROPHILS RELATIVE PERCENT: 28.7 %
PLATELET COUNT: 90 10*9/L — ABNORMAL LOW (ref 150–450)
RED BLOOD CELL COUNT: 3.49 10*12/L — ABNORMAL LOW (ref 3.95–5.13)
RED CELL DISTRIBUTION WIDTH: 14.1 % (ref 12.2–15.2)
WBC ADJUSTED: 1.8 10*9/L — ABNORMAL LOW (ref 3.6–11.2)

## 2024-05-21 LAB — SMEAR - BONE MARROW PATIENT

## 2024-05-21 MED ADMIN — heparin, porcine (PF) 100 unit/mL injection 500 Units: 500 [IU] | INTRAVENOUS | @ 17:00:00 | Stop: 2024-05-22

## 2024-05-21 MED ADMIN — midazolam (VERSED) injection 3 mg: 3 mg | INTRAVENOUS | @ 17:00:00 | Stop: 2024-05-21

## 2024-05-21 NOTE — Unmapped (Addendum)
-  Please do not drive for 24 hours after the procedure if you received medications through your IV.  -Keep the biopsy site dry and covered for 24 hours.   -After 24 hours, you may remove the dressing and cover the site with a bandage.  -Call the clinic if you have: bleeding from the site that does not stop after 10 minutes of firm pressure, redness and/or puss drainage from the site, and fever.      BMT clinic: [phone]  Inpatient unit: [phone] (call this number if after hours)

## 2024-05-21 NOTE — Unmapped (Signed)
 Bone Marrow Biopsy and Aspiration    Name: Patricia [name]   MRNO: [id number]     Indications   Indications : Pre-transplant sample     Oncologic Diagnosis: ALL    Lab Results   Component Value Date    WBC 1.8 (L) 05/21/2024    HGB 11.3 05/21/2024    HCT 32.8 (L) 05/21/2024    PLT 90 (L) 05/21/2024     I have reviewed lab results which are normal or stable for the scheduled procedure.     Vitals:    05/21/24 1155   BP: 136/93   Pulse: 78   Temp: 36.4 ??C (97.5 ??F)   SpO2: 98%       Procedure:  Time Out: Performed immediately prior to the procedure: yes      The procedure risks, benefits and alternatives of the procedure were explained to the patient.  All questions were answered. Consent was obtained (yes ). ErinWestbrook was made aware that she is not to drive for the next 24 hours if receiving any IV medications associated with this procedure. She verbalized understanding and signed informed consent. After a time-out in which his patient identifiers were checked by 2 providers, the patient was laid in the prone position on the table. The left posterior superior iliac spine and iliac crest were cleaned, prepped and draped in the usual sterile fashion.     Site: left side posterior iliac crest    Aseptic preparation, draping, and technique was used.     Anesthesia: 2% plain lidocaine     Periprocedural medications: Midazolam  3 mg IV x1 for situational anxiety.    Core Biopsy and aspirate were obtained and sent for routine histopathologic stains and sectioning, flow cytometry, cytogenetics and molecular analysis.    Were spicules confirmed bedside? yes  How long was core noted to be bedside? 2 cm  Hempath tech: Claretta Brought  Additional Details:None    Specimens Collected:  EDTA x 2  Heparin  x 1  Core biopsy x 1    Pressure applied and hemostasis achieved. Sterile bandage applied.     Complications: None     Additional post procedure instructions:  The patient was given verbal instructions for wound care, such as to keep the biopsy site dry and covered for 24 hours, and to call the provider with including but not limited to: bleeding from the site, redness and/or puss drainage from the site and fever.      Maralee PARAS Shakera Ebrahimi, FNP  [place] [facility]

## 2024-05-21 NOTE — Unmapped (Signed)
 Pt presented to infusion center for scheduled bmbx. Time out performed by provider and reporting RN. Lidocaine  admin by provider prior to procedure and Versed  2mg  admin by reporting RN. Provider did bmbx and pt tolerated procedure without incident. Port heparinized and deaccessed before discharge. Discharged from clinic ambulatory and in NAD with mother.

## 2024-05-21 NOTE — Unmapped (Signed)
 Encounter addended by: Keenan Maralee PARAS, FNP on: 05/21/2024 1:38 PM   Actions taken: Charge Capture section accepted

## 2024-05-22 DIAGNOSIS — C9100 Acute lymphoblastic leukemia not having achieved remission: Principal | ICD-10-CM

## 2024-05-22 NOTE — Unmapped (Signed)
 Outpatient BMT SW Note: SW called to check in on pt. Pt reports she is preparing for transplant and feeling a little nervous about bills. SW reviewed information on the [employer] grants. Pt wanted to apply for grant. SW applied for pre-transplant grant via online with pt today.    SW reviewed guidelines for this program and offered to follow up with her on updates.    Pt was appreciative.

## 2024-05-23 ENCOUNTER — Inpatient Hospital Stay: Admit: 2024-05-23 | Discharge: 2024-05-24 | Payer: PRIVATE HEALTH INSURANCE

## 2024-05-23 ENCOUNTER — Ambulatory Visit: Admit: 2024-05-23 | Discharge: 2024-05-24 | Payer: PRIVATE HEALTH INSURANCE

## 2024-05-23 ENCOUNTER — Encounter: Admit: 2024-05-23 | Discharge: 2024-05-24 | Payer: PRIVATE HEALTH INSURANCE

## 2024-05-23 ENCOUNTER — Other Ambulatory Visit: Admit: 2024-05-23 | Discharge: 2024-05-24 | Payer: PRIVATE HEALTH INSURANCE

## 2024-05-23 DIAGNOSIS — Z7682 Awaiting organ transplant status: Principal | ICD-10-CM

## 2024-05-23 DIAGNOSIS — C9100 Acute lymphoblastic leukemia not having achieved remission: Principal | ICD-10-CM

## 2024-05-23 LAB — COMPREHENSIVE METABOLIC PANEL
ALBUMIN: 4 g/dL (ref 3.4–5.0)
ALKALINE PHOSPHATASE: 68 U/L (ref 46–116)
ALT (SGPT): 25 U/L (ref 10–49)
ANION GAP: 13 mmol/L (ref 5–14)
AST (SGOT): 26 U/L (ref <=34)
BILIRUBIN TOTAL: 0.4 mg/dL (ref 0.3–1.2)
BLOOD UREA NITROGEN: 8 mg/dL — ABNORMAL LOW (ref 9–23)
BUN / CREAT RATIO: 15
CALCIUM: 9.5 mg/dL (ref 8.7–10.4)
CHLORIDE: 104 mmol/L (ref 98–107)
CO2: 25 mmol/L (ref 20.0–31.0)
CREATININE: 0.54 mg/dL — ABNORMAL LOW (ref 0.55–1.02)
EGFR CKD-EPI (2021) FEMALE: >90 mL/min/1.73m2 (ref >=60)
GLUCOSE RANDOM: 174 mg/dL (ref 70–179)
POTASSIUM: 3.7 mmol/L (ref 3.4–4.8)
PROTEIN TOTAL: 7.4 g/dL (ref 5.7–8.2)
SODIUM: 142 mmol/L (ref 135–145)

## 2024-05-23 LAB — URINALYSIS WITH MICROSCOPY WITH CULTURE REFLEX PERFORMABLE
BACTERIA: NONE SEEN /HPF
BILIRUBIN UA: NEGATIVE
GLUCOSE UA: NEGATIVE
KETONES UA: NEGATIVE
LEUKOCYTE ESTERASE UA: NEGATIVE
NITRITE UA: NEGATIVE
PH UA: 6 (ref 5.0–9.0)
RBC UA: 8 /HPF — ABNORMAL HIGH (ref <=4)
SPECIFIC GRAVITY UA: 1.015 (ref 1.003–1.030)
SQUAMOUS EPITHELIAL: <1 /HPF (ref 0–5)
UROBILINOGEN UA: <2
WBC UA: 4 /HPF (ref 0–5)

## 2024-05-23 LAB — APTT
APTT: 54.4 s — ABNORMAL HIGH (ref 24.8–38.4)
HEPARIN CORRELATION: 0.3

## 2024-05-23 LAB — CBC W/ AUTO DIFF
BASOPHILS ABSOLUTE COUNT: 0 10*9/L (ref 0.0–0.1)
BASOPHILS RELATIVE PERCENT: 1.2 %
EOSINOPHILS ABSOLUTE COUNT: 0.1 10*9/L (ref 0.0–0.5)
EOSINOPHILS RELATIVE PERCENT: 3.8 %
HEMATOCRIT: 34.6 % (ref 34.0–44.0)
HEMOGLOBIN: 11.8 g/dL (ref 11.3–14.9)
LYMPHOCYTES ABSOLUTE COUNT: 1.2 10*9/L (ref 1.1–3.6)
LYMPHOCYTES RELATIVE PERCENT: 52.8 %
MEAN CORPUSCULAR HEMOGLOBIN CONC: 34.2 g/dL (ref 32.0–36.0)
MEAN CORPUSCULAR HEMOGLOBIN: 32.3 pg (ref 25.9–32.4)
MEAN CORPUSCULAR VOLUME: 94.4 fL (ref 77.6–95.7)
MEAN PLATELET VOLUME: 10.7 fL (ref 6.8–10.7)
MONOCYTES ABSOLUTE COUNT: 0.3 10*9/L (ref 0.3–0.8)
MONOCYTES RELATIVE PERCENT: 13.3 %
NEUTROPHILS ABSOLUTE COUNT: 0.7 10*9/L — ABNORMAL LOW (ref 1.8–7.8)
NEUTROPHILS RELATIVE PERCENT: 28.9 %
PLATELET COUNT: 85 10*9/L — ABNORMAL LOW (ref 150–450)
RED BLOOD CELL COUNT: 3.66 10*12/L — ABNORMAL LOW (ref 3.95–5.13)
RED CELL DISTRIBUTION WIDTH: 14 % (ref 12.2–15.2)
WBC ADJUSTED: 2.3 10*9/L — ABNORMAL LOW (ref 3.6–11.2)

## 2024-05-23 LAB — EPSTEIN-BARR VIRUS ANTIBODY PANEL
EPSTEIN-BARR NUCLEAR ANTIGEN AB: POSITIVE — AB
EPSTEIN-BARR VCA IGG ANTIBODY: POSITIVE — AB
EPSTEIN-BARR VCA IGM ANTIBODY: NEGATIVE

## 2024-05-23 LAB — HSV ANTIBODIES, IGG
HERPES SIMPLEX VIRUS 1 IGG: POSITIVE — AB
HERPES SIMPLEX VIRUS 2 IGG: POSITIVE — AB
HSV 2 IGG OD: 2.98

## 2024-05-23 LAB — URIC ACID: URIC ACID: 2.4 mg/dL — ABNORMAL LOW (ref 3.1–7.8)

## 2024-05-23 LAB — TOXOPLASMA GONDII ANTIBODY, IGG: TOXOPLASMA GONDII IGG: NEGATIVE

## 2024-05-23 LAB — HEPATITIS B CORE ANTIBODY, TOTAL: HEPATITIS B CORE TOTAL ANTIBODY: REACTIVE — AB

## 2024-05-23 LAB — PROTIME-INR
INR: 1.07
PROTIME: 12.2 s (ref 9.9–12.6)

## 2024-05-23 LAB — HIV ANTIGEN/ANTIBODY COMBO: HIV ANTIGEN/ANTIBODY COMBO: NONREACTIVE

## 2024-05-23 LAB — HEPATITIS B SURFACE ANTIGEN: HEPATITIS B SURFACE ANTIGEN: NONREACTIVE

## 2024-05-23 LAB — HEPATITIS C ANTIBODY
HCV S/CO VALUE: 0.07
HEPATITIS C ANTIBODY: NONREACTIVE

## 2024-05-23 LAB — LACTATE DEHYDROGENASE: LACTATE DEHYDROGENASE: 301 U/L — ABNORMAL HIGH (ref 120–246)

## 2024-05-23 LAB — MAGNESIUM: MAGNESIUM: 1.7 mg/dL (ref 1.6–2.6)

## 2024-05-23 LAB — PHOSPHORUS: PHOSPHORUS: 3.4 mg/dL (ref 2.4–5.1)

## 2024-05-23 LAB — SYPHILIS SCREEN: SYPHILIS RPR SCREEN: NONREACTIVE

## 2024-05-23 LAB — VARICELLA ZOSTER ANTIBODY, IGG: VZV IGG S/CO: 14.7

## 2024-05-23 NOTE — Progress Notes (Signed)
 Met with patient.  Provided the patient an updated BMT packet which included a copy of consent forms and references to read prior to the clearance and consent appointment with Dr. Jeaneen.  All contents of the packet reviewed and the patient's calendar/itinerary was reviewed in detail.  All questions and concerns addressed.  Total Time Spent: 60 mins  Time spent in direct patient care: 40mim

## 2024-05-24 ENCOUNTER — Inpatient Hospital Stay: Admit: 2024-05-24 | Discharge: 2024-05-24 | Payer: PRIVATE HEALTH INSURANCE

## 2024-05-24 ENCOUNTER — Ambulatory Visit: Admit: 2024-05-24 | Discharge: 2024-05-24 | Payer: PRIVATE HEALTH INSURANCE

## 2024-05-24 ENCOUNTER — Other Ambulatory Visit: Admit: 2024-05-24 | Discharge: 2024-05-24 | Payer: PRIVATE HEALTH INSURANCE

## 2024-05-24 DIAGNOSIS — C9100 Acute lymphoblastic leukemia not having achieved remission: Principal | ICD-10-CM

## 2024-05-24 DIAGNOSIS — Z7682 Awaiting organ transplant status: Principal | ICD-10-CM

## 2024-05-24 DIAGNOSIS — E119 Type 2 diabetes mellitus without complications: Principal | ICD-10-CM

## 2024-05-24 DIAGNOSIS — Z794 Long term (current) use of insulin: Principal | ICD-10-CM

## 2024-05-24 LAB — CBC W/ AUTO DIFF
BASOPHILS ABSOLUTE COUNT: 0 10*9/L (ref 0.0–0.1)
BASOPHILS RELATIVE PERCENT: 0.5 %
EOSINOPHILS ABSOLUTE COUNT: 0.1 10*9/L (ref 0.0–0.5)
EOSINOPHILS RELATIVE PERCENT: 1.9 %
HEMATOCRIT: 32.5 % — ABNORMAL LOW (ref 34.0–44.0)
HEMOGLOBIN: 11.4 g/dL (ref 11.3–14.9)
LYMPHOCYTES ABSOLUTE COUNT: 1.5 10*9/L (ref 1.1–3.6)
LYMPHOCYTES RELATIVE PERCENT: 38.1 %
MEAN CORPUSCULAR HEMOGLOBIN CONC: 35 g/dL (ref 32.0–36.0)
MEAN CORPUSCULAR HEMOGLOBIN: 32.8 pg — ABNORMAL HIGH (ref 25.9–32.4)
MEAN CORPUSCULAR VOLUME: 93.7 fL (ref 77.6–95.7)
MEAN PLATELET VOLUME: 10.8 fL — ABNORMAL HIGH (ref 6.8–10.7)
MONOCYTES ABSOLUTE COUNT: 0.4 10*9/L (ref 0.3–0.8)
MONOCYTES RELATIVE PERCENT: 11.2 %
NEUTROPHILS ABSOLUTE COUNT: 1.9 10*9/L (ref 1.8–7.8)
NEUTROPHILS RELATIVE PERCENT: 48.3 %
PLATELET COUNT: 79 10*9/L — ABNORMAL LOW (ref 150–450)
RED BLOOD CELL COUNT: 3.47 10*12/L — ABNORMAL LOW (ref 3.95–5.13)
RED CELL DISTRIBUTION WIDTH: 14.1 % (ref 12.2–15.2)
WBC ADJUSTED: 3.9 10*9/L (ref 3.6–11.2)

## 2024-05-24 LAB — SLIDE REVIEW

## 2024-05-24 LAB — HTLV I/II ANTIBODY: HTLV I/II ANTIBODIES: NEGATIVE

## 2024-05-24 LAB — HEPATITIS B DNA, QUANTITATIVE, PCR: HBV DNA QUANT: NOT DETECTED

## 2024-05-24 MED ADMIN — midazolam (VERSED) injection 2 mg: 2 mg | INTRAVENOUS | @ 20:00:00 | Stop: 2024-05-24

## 2024-05-24 MED ADMIN — traMADol (ULTRAM) tablet 50 mg: 50 mg | ORAL | @ 18:00:00 | Stop: 2024-05-24

## 2024-05-24 MED ADMIN — midazolam (VERSED) injection 2 mg: 2 mg | INTRAVENOUS | @ 18:00:00 | Stop: 2024-05-24

## 2024-05-24 MED ADMIN — heparin, porcine (PF) 100 unit/mL injection 500 Units: 500 [IU] | INTRAVENOUS | @ 20:00:00 | Stop: 2024-05-25

## 2024-05-24 NOTE — Patient Instructions (Signed)
 Please leave dressing in place and keep it dry for 24 hrs before removing. You can resume normal activities tomorrow, but take things easy today.     You may take tylenol  as needed for discomfort at the area where the biopsy was taken.   After receiving versed /tramadol , please do not drive today.     If you have questions between 8am to 5 pm Monday through Friday please call [phone] and speak to the operator.      For emergencies, evenings or weekends, please call [phone] and ask for oncology fellow on call.     Reasons to call emergency line may include:   Fever of 100.5 or greater   Nausea and/or vomiting not relieved with nausea medicine   Diarrhea or constipation   Severe pain not relieved with usual pain regimen       Lab on 05/24/2024   Component Date Value Ref Range Status    WBC 05/24/2024 3.9  3.6 - 11.2 10*9/L Final    RBC 05/24/2024 3.47 (L)  3.95 - 5.13 10*12/L Final    HGB 05/24/2024 11.4  11.3 - 14.9 g/dL Final    HCT 89/75/7974 32.5 (L)  34.0 - 44.0 % Final    MCV 05/24/2024 93.7  77.6 - 95.7 fL Final    MCH 05/24/2024 32.8 (H)  25.9 - 32.4 pg Final    MCHC 05/24/2024 35.0  32.0 - 36.0 g/dL Final    RDW 89/75/7974 14.1  12.2 - 15.2 % Final    MPV 05/24/2024 10.8 (H)  6.8 - 10.7 fL Final    Platelet 05/24/2024 79 (L)  150 - 450 10*9/L Final    Neutrophils % 05/24/2024 48.3  % Final    Lymphocytes % 05/24/2024 38.1  % Final    Monocytes % 05/24/2024 11.2  % Final    Eosinophils % 05/24/2024 1.9  % Final    Basophils % 05/24/2024 0.5  % Final    Absolute Neutrophils 05/24/2024 1.9  1.8 - 7.8 10*9/L Final    Absolute Lymphocytes 05/24/2024 1.5  1.1 - 3.6 10*9/L Final    Absolute Monocytes 05/24/2024 0.4  0.3 - 0.8 10*9/L Final    Absolute Eosinophils 05/24/2024 0.1  0.0 - 0.5 10*9/L Final    Absolute Basophils 05/24/2024 0.0  0.0 - 0.1 10*9/L Final    Smear Review Comments 05/24/2024 See Comment  Undefined Final    Slide reviewed.  [id number]

## 2024-05-24 NOTE — Progress Notes (Signed)
 Pt presents to the clinic, room 10 for the following???  Treatment: LP with IT chemo.    Methotrexate  [id number]      Data:   vital signs stable, review of systems within parameters, labs reviewed within parameters, adverse drug reactions reviewed. IV access Port.  Action:   Consent obtained by provider at bedside. Time out completed. Lidocaine  administered by Provider as ordered. Versed  and Tramadol  administered per orders.    Response:   Provider unable to complete procedure. See PA note for details.  No complications noted, positive blood return, no redness or edema at port site.  Port heparinized upon de access.  Site was CDI and free from s/sx of infection.    AVS was printed.  Pt was discharged in stable condition.

## 2024-05-24 NOTE — Progress Notes (Signed)
 Outpatient BMT SW Note:  SW received the following message from [employer]:    We are pleased to inform you that your Pre-Transplant Support Assistance application for the patient below has been approved!   Patient: Patricia [name]  Payee: Patricia [name]  Payment method: Card  Please note:   Changing the payment method after grant approval will result in a delay in payment processing.   Address funds are being sent to:  Patient address: [address] [place] [address]  or   Alternate mailing address:   Fund Amount   [employer] Patient Financial Assistance  $1,250.00    [employer] sends funding to the chosen payee every Friday. Funding is typically received within ten business days from the date of this approval. If your approval is received after Wednesday at 1:30pm CT, the payment will be sent the following Friday.    SW called pt and made sure she was aware of approval.    Pt was appreciative.    SW encouraged her to call if SW could further assist.

## 2024-05-24 NOTE — Progress Notes (Signed)
 Lumbar Puncture Procedure Note    Today's Date: 05/24/2024    Diagnosis: Problem List[1]    Indications:    Diagnosis ICD-10-CM Associated Orders   1. 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)    (CMS-HCC)  C91.00 CSF protein     Glucose, CSF     Hempath Leukemia Flowcytometry, CSF     Physician communication order     OKAY TO SEND MEDICATION/CHEMOTHERAPY TO OUTPATIENT UNIT     methotrexate  (PRESERVATIVE FREE) 12 mg, hydrocortisone  sod succ (Solu-CORTEF ) 50 mg in sodium chloride  (NS) 0.9 % 6 mL INTRATHECAL syringe     Implement     Adult Oxygen therapy     sodium chloride  (NS) 0.9 % infusion     sodium chloride  0.9% (NS) bolus 1,000 mL     diphenhydrAMINE  (BENADRYL ) injection 25 mg     famotidine  (PF) (PEPCID ) injection 20 mg     dexAMETHasone  (DECADRON ) 4 mg/mL injection 20 mg     methylPREDNISolone  sodium succinate  (SOLU-Medrol ) injection 125 mg     EPINEPHrine (EPIPEN) injection 0.3 mg     Physician communication order     Implement     Adult Oxygen therapy          Premedication: Versed  2mg  IV, tramadol  50mg  po, additional 1mg  IV versed  given    Confirmed Driver: yes    Clinician(s) Performing Procedure:  Ronal Plume, PA-C    Procedure Details     Consent: The risks, benefits, and alternatives to the procedure were discussed. All questions were answered. Consent was obtained and witnessed.    A time-out in which Her patient identifiers were checked by 2 providers was performed.    The patient was positioned under sterile conditions. Betadine solution and sterile drapes were utilized. Local anesthesia with 1% lidocaine  was applied subcutaneously then deep to the skin. A spinal needle was inserted at the L3 - L4 interspace. Spinal fluid was not obtained despite multiple attempts.  Attmepts were made in the seated and side lying positions.    The spinal needle with trocar was removed with minimal bleeding noted upon removal. A sterile bandage was placed over the puncture site after holding pressure.        Findings  6mL of clear spinal fluid was obtained.    Complications:  None; patient tolerated the procedure well.          Condition: stable    Plan  Bed rest for 1 hours.  Tylenol  650 mg for pain.  Call office if you develop a severe headache, nausea, vomiting, or fever greater than 100.5 F.              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

## 2024-05-24 NOTE — Progress Notes (Signed)
 Diabetes Education Note: Initial Assessment    Referring Provider:  [name] MD    Time In / Out: 9:10-10:10AM    Pt's husband was present for visit        Assessment:      Patricia [name] is a(n) English-speaking 48 y.o. female who presents for diabetes education. She has T2DM d/t steroid induced hyperglycemia. She is in treatment for cancer and is scheduled to have a bone marrow transplant in November. She is currently wearing a Dexcom G7. We reviewed her AGP report. See full report in Media. TIR 42%, Very Low 0%, Low 0%, High 29%, Very High 29%, CV 36.7%, Avg BG 205, GMI 8.2%. She is interested in starting a pump, particularly the omnipod 5.      Educational intervention and any recommendations can be seen below in the PLAN section.       Plan:      Based on the assessment, pt educational needs are below.       Education Needs Initial  Assessment   1 Diabetes disease process x   2 Blood glucose monitoring            x   3 Medications x   4 Nutrition management x   5 Physical activity x   6 Acute complications and illness x   7 Risk prevention x   8 Coping and stress management x   9 Goal setting and problem solving x   10 Diabetes support plan             x       Education Intervention Provided  We discussed insulin  action time and encouraged pt to be careful not to insulin  stack as it increases risk of hypoglycemia. Pt is interested in carb counting. Per her current TDD and rule of 450, ICR would be about 1:9. I would leave her ISF as is now to be conservative. She is willing to use tools such as chatgpt or calorieking to help her carb count.     We discussed how insulin  pumps work, what is an infusion set, how AID works and the different pumps on [employer]. We discussed the pros/cons of the omnipod 5. She likes the fact that it is tubeless and knows someone who uses it and likes it.     Discussed rule of 15 to treat hypoglycemia.      Pt Established Goal:   ICR 1:9, ISF 1:50/150  Use correction scale only if you are giving a correction dose without food    Pt Established Support Plan:   Reviewed different tools to help with carb counting    Educator Recommendations / Plan for Supervising Physician:  See above    Plan for Diabetes Education Follow-up  Discuss pump further.      Subjective:        Desired Leaning Objective  Improve BG, learn about omnipod    Diabetes History  Pt has had Type 2 since cancer relapse in April    Pt has not had diabetes education in the past.     Diabetes Meds:   Lantus  25U  Novolog  10-15U w/ carby meals if over 150    Monitoring  Dexcom G7      Objective:             Medications Ordered Prior to Encounter[1]     Past Medical History[2]    Relevant Labs  HGB A1C, POC   Date/Time Value Ref Range  Status   03/22/2024 01:48 PM 5.0 <7.0 % Final     Comment:     A1c Glycemic Goal: <7.0%     **Goals should be individualized; more or less stringent A1c glycemic goals may be appropriate for individual patients.      (Adopted from: 2020 ADA Standards of Medical Care In Diabetes)  Point of Care A1c testing is not FDA-approved for the diagnosis of Diabetes.     Hemoglobin A1C   Date/Time Value Ref Range Status   10/17/2023 09:35 AM 7.1 (H) 4.8 - 5.6 % Final   11/15/2022 09:24 AM <3.8 (L) 4.8 - 5.6 % Final           Maxwell Perna MPH, RD, LDN, CDCES         [1]   Current Outpatient Medications on File Prior to Visit   Medication Sig Dispense Refill    acetaminophen  (TYLENOL  8 HOUR) 650 MG CR tablet Take 2 tablets (1,300 mg total) by mouth every eight (8) hours as needed for pain.      albuterol  HFA 90 mcg/actuation inhaler Inhale 2 puffs every six (6) hours as needed for wheezing. 8 g 1    aspirin  (ECOTRIN) 81 MG tablet Take 1 tablet (81 mg total) by mouth daily.      blood sugar diagnostic (GLUCOSE BLOOD) Strp Use to check blood sugar two times a day. 100 each 0    blood-glucose meter kit Use to check blood sugar two times a day. Use as instructed. 1 each 0    blood-glucose sensor (DEXCOM G7 SENSOR) Devi 1 each by Miscellaneous route every ten (10) days. 9 each 3    blood-glucose,receiver,cont (DEXCOM G7 RECEIVER) Misc 1 each by Miscellaneous route once for 1 dose. 1 each 0    cetirizine  (ZYRTEC ) 10 MG tablet Take 1 tablet (10 mg total) by mouth nightly. 90 tablet 3    dapsone  100 MG tablet Take 1 tablet (100 mg total) by mouth daily. TAKE 1 TABLET(100 MG) BY MOUTH DAILY 30 tablet 11    escitalopram  oxalate (LEXAPRO ) 10 MG tablet Take 1 tablet (10 mg total) by mouth daily. 90 tablet 2    fluconazole  (DIFLUCAN ) 200 MG tablet Take 1 tablet (200 mg total) by mouth daily when ANC less than 0.5 30 tablet 0    glucagon  spray 3 mg/actuation Spry Use 1 spray in 1 nostril for severe hypoglycemia, as per package instructions 1 each 2    insulin  aspart (NOVOLOG  FLEXPEN) 100 unit/mL (3 mL) injection pen Use up to 30 units/day, divided TID AC meals as per MD instructions 30 mL 12    insulin  glargine (LANTUS  SOLOSTAR U-100 INSULIN ) 100 unit/mL (3 mL) injection pen Inject 0.22 mL (22 Units total) under the skin nightly. 3 mL 10    lancets Misc Use to check blood sugar as directed 2 times a day & for symptoms of high or low blood sugar. 100 each 0    letermovir  (PREVYMIS ) 480 mg tablet Take 1 tablet (480 mg total) by mouth daily. 28 tablet 5    levoFLOXacin  (LEVAQUIN ) 500 MG tablet Take 1 tablet (500 mg total) by mouth daily when ANC less than 0.5 30 tablet 0    losartan  (COZAAR ) 50 MG tablet Take 1 tablet (50 mg total) by mouth in the morning. 100 tablet 3    PONATinib  (ICLUSIG ) 30 mg tablet Take 1 tablet (30 mg total) by mouth daily. Swallow tablets whole. Do not crush, break, cut or chew tablets.  30 tablet 5    valACYclovir  (VALTREX ) 500 MG tablet Take 1 tablet (500 mg total) by mouth daily. 30 tablet 12     No current facility-administered medications on file prior to visit.   [2]   Past Medical History:  Diagnosis Date    Anxiety     B-cell acute lymphoblastic leukemia (ALL)    (CMS-HCC) 10/29/2021    Cytomegalovirus (CMV) viremia    (CMS-HCC)     Diabetes mellitus    (CMS-HCC)     Hypertension

## 2024-05-25 DIAGNOSIS — C9100 Acute lymphoblastic leukemia not having achieved remission: Principal | ICD-10-CM

## 2024-05-26 DIAGNOSIS — C9100 Acute lymphoblastic leukemia not having achieved remission: Principal | ICD-10-CM

## 2024-05-26 MED ORDER — ESCITALOPRAM 10 MG TABLET
ORAL_TABLET | Freq: Every day | ORAL | 2 refills | 90.00000 days | Status: CP
Start: 2024-05-26 — End: ?

## 2024-05-26 NOTE — Progress Notes (Signed)
 UA and VRE collected.

## 2024-05-27 DIAGNOSIS — C9100 Acute lymphoblastic leukemia not having achieved remission: Principal | ICD-10-CM

## 2024-05-28 DIAGNOSIS — B259 Cytomegaloviral disease, unspecified: Principal | ICD-10-CM

## 2024-05-28 DIAGNOSIS — C9100 Acute lymphoblastic leukemia not having achieved remission: Principal | ICD-10-CM

## 2024-05-28 LAB — HLA DS POST TRANSPLANT
ANTI-DONOR HLA-A #1 MFI: 0 MFI
ANTI-DONOR HLA-A #2 MFI: 0 MFI
ANTI-DONOR HLA-B #1 MFI: 0 MFI
ANTI-DONOR HLA-B #2 MFI: 0 MFI
ANTI-DONOR HLA-C #1 MFI: 45 MFI
ANTI-DONOR HLA-C #2 MFI: 38 MFI
ANTI-DONOR HLA-DQB #1 MFI: 0 MFI
ANTI-DONOR HLA-DQB #2 MFI: 89 MFI
ANTI-DONOR HLA-DR #1 MFI: 23 MFI
ANTI-DONOR HLA-DR #2 MFI: 112 MFI
DSA PT DONOR ID: 41092486434

## 2024-05-28 LAB — FSAB CLASS 2 ANTIBODY SPECIFICITY: HLA CL2 AB RESULT: NEGATIVE

## 2024-05-28 LAB — FSAB CLASS 1 ANTIBODY SPECIFICITY: HLA CLASS 1 ANTIBODY RESULT: NEGATIVE

## 2024-05-28 MED ORDER — LETERMOVIR 480 MG TABLET
ORAL_TABLET | Freq: Every day | ORAL | 5 refills | 28.00000 days | Status: CP
Start: 2024-05-28 — End: ?
  Filled 2024-06-03: qty 28, 28d supply, fill #0

## 2024-05-29 ENCOUNTER — Ambulatory Visit: Admit: 2024-05-29 | Discharge: 2024-05-29 | Payer: PRIVATE HEALTH INSURANCE

## 2024-05-29 ENCOUNTER — Other Ambulatory Visit: Admit: 2024-05-29 | Discharge: 2024-05-29 | Payer: PRIVATE HEALTH INSURANCE

## 2024-05-29 ENCOUNTER — Ambulatory Visit
Admit: 2024-05-29 | Discharge: 2024-05-29 | Payer: PRIVATE HEALTH INSURANCE | Attending: Hematology | Primary: Hematology

## 2024-05-29 DIAGNOSIS — Z7682 Awaiting organ transplant status: Principal | ICD-10-CM

## 2024-05-29 DIAGNOSIS — C9100 Acute lymphoblastic leukemia not having achieved remission: Principal | ICD-10-CM

## 2024-05-29 LAB — COMPREHENSIVE METABOLIC PANEL
ALBUMIN: 4.1 g/dL (ref 3.4–5.0)
ALKALINE PHOSPHATASE: 72 U/L (ref 46–116)
ALT (SGPT): 22 U/L (ref 10–49)
ANION GAP: 15 mmol/L — ABNORMAL HIGH (ref 5–14)
AST (SGOT): 28 U/L (ref <=34)
BILIRUBIN TOTAL: 0.4 mg/dL (ref 0.3–1.2)
BLOOD UREA NITROGEN: 11 mg/dL (ref 9–23)
BUN / CREAT RATIO: 20
CALCIUM: 9.5 mg/dL (ref 8.7–10.4)
CHLORIDE: 106 mmol/L (ref 98–107)
CO2: 23 mmol/L (ref 20.0–31.0)
CREATININE: 0.56 mg/dL (ref 0.55–1.02)
EGFR CKD-EPI (2021) FEMALE: >90 mL/min/1.73m2 (ref >=60)
GLUCOSE RANDOM: 140 mg/dL (ref 70–179)
POTASSIUM: 3.7 mmol/L (ref 3.4–4.8)
PROTEIN TOTAL: 7.4 g/dL (ref 5.7–8.2)
SODIUM: 144 mmol/L (ref 135–145)

## 2024-05-29 LAB — CBC W/ AUTO DIFF
BASOPHILS ABSOLUTE COUNT: 0 10*9/L (ref 0.0–0.1)
BASOPHILS RELATIVE PERCENT: 0.5 %
EOSINOPHILS ABSOLUTE COUNT: 0.1 10*9/L (ref 0.0–0.5)
EOSINOPHILS RELATIVE PERCENT: 1.2 %
HEMATOCRIT: 36.2 % (ref 34.0–44.0)
HEMOGLOBIN: 12.3 g/dL (ref 11.3–14.9)
LYMPHOCYTES ABSOLUTE COUNT: 1.6 10*9/L (ref 1.1–3.6)
LYMPHOCYTES RELATIVE PERCENT: 22.5 %
MEAN CORPUSCULAR HEMOGLOBIN CONC: 33.9 g/dL (ref 32.0–36.0)
MEAN CORPUSCULAR HEMOGLOBIN: 32.2 pg (ref 25.9–32.4)
MEAN CORPUSCULAR VOLUME: 95 fL (ref 77.6–95.7)
MEAN PLATELET VOLUME: 10.3 fL (ref 6.8–10.7)
MONOCYTES ABSOLUTE COUNT: 0.6 10*9/L (ref 0.3–0.8)
MONOCYTES RELATIVE PERCENT: 8.5 %
NEUTROPHILS ABSOLUTE COUNT: 4.8 10*9/L (ref 1.8–7.8)
NEUTROPHILS RELATIVE PERCENT: 67.3 %
PLATELET COUNT: 126 10*9/L — ABNORMAL LOW (ref 150–450)
RED BLOOD CELL COUNT: 3.81 10*12/L — ABNORMAL LOW (ref 3.95–5.13)
RED CELL DISTRIBUTION WIDTH: 14.6 % (ref 12.2–15.2)
WBC ADJUSTED: 7.1 10*9/L (ref 3.6–11.2)

## 2024-05-29 LAB — HCG QUANTITATIVE, BLOOD: GONADOTROPIN, CHORIONIC (HCG) QUANT: 3.1 m[IU]/mL

## 2024-05-29 NOTE — Progress Notes (Signed)
 [facility] Refill Coordination Note    Patricia [name], DOB: 11-Oct-1975  Phone: [phone] (home)       All above HIPAA information was verified with patient.         05/28/2024     3:59 PM   Specialty Rx Medication Refill Questionnaire   Which Medications would you like refilled and shipped? Iclusig , Prevymis    Please list all current allergies: Same as listed   Have you missed any doses in the last 30 days? No   Have you had any changes to your medication(s) since your last refill? No   How much of each medication do you have remaining at home? (eg. number of tablets, injections, etc.) 1 week   Have you experienced any side effects in the last 30 days? No   Please enter the full address (street address, city, state, zip code) where you would like your medication(s) to be delivered to. [address], [place] [address]   Please specify on which day you would like your medication(s) to arrive. Note: if you need your medication(s) within 3 days, please call the pharmacy to schedule your order at [phone]  06/03/2024   Has your insurance changed since your last refill? No   Would you like a pharmacist to call you to discuss your medication(s)? No   Do you require a signature for your package? (Note: if we are billing Medicare Part B or your order contains a controlled substance, we will require a signature) No   I have been provided my out of pocket cost for my medication and approve the pharmacy to charge the amount to my credit card on file. Yes         Completed refill call assessment today to schedule patient's medication shipment from the [facility] [phone]).  All relevant notes have been reviewed.       Confirmed patient received a Conservation Officer, Historic Buildings and a Surveyor, Mining with first shipment. The patient will receive a drug information handout for each medication shipped and additional FDA Medication Guides as required. REFERRAL TO PHARMACIST     Referral to the pharmacist: Not needed      [facility]     Shipping address confirmed in Epic.     Delivery Scheduled: Yes, Expected medication delivery date: 06/03/24.     Medication will be delivered via Same Day Courier to the prescription address in Epic [place].    Ardell Aaronson M Santer Torres   [place] [facility] Specialty Technician

## 2024-05-29 NOTE — Progress Notes (Signed)
 [facility] BMT & CT PROGRAM  PRE-TRANSPLANT ASSESSMENT AND CONSENTS    [name]  Patient Age: 48 y.o.  Encounter Date: 05/29/2024    Primary Care Provider:  Estelle Toribio SAUNDERS, MD    Referring Physician:  Referring, None Per Patient  [address] [place],  [place] [address]    Cancer Diagnosis: Ph+ B-ALL; Initial Dx 10/29/2021  Cancer status: CR1, BCR/ABL 47/100,000 10/16/23  Treatment Regimen: First Line, ponatinib  + maintenance (vincristine , prednisone ) s/p truncated course of GRAAPH-2005, Hyper-CVAD, received 5 cycles. Then transitioned to maintenance. Progressed with CNS and BM relapse. Now in complete molecular remission achieved with blinatumomab  and ponatinib  30 mg, later 15 mg QD.  Treatment Goal: Curative  Comorbidities: HTN, anxiety, hemorrhoids with anal fissures  Transplant: Referral deferred in CR1    Patricia [name] is a 48 y.o. 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 She continues blinatumomab  and ponatinib .   HyperCVAD+dasatinib  was stopped s/p 5 cycles due to intolerance with multiple hospitalizations for infections. She received 11 doses of IT chemotherapy. Given her deep molecular remission and her intolerance of hyperCVAD+dasatinib , she was transitioned to maintenance chemotherapy, with pred+VCR+dasatinib  and later pred+VCR+ponatinib  15 mg.    In the Fall 2024 her p190 transcript become detectable at low level. The BMBx on 10/17/23 showed molecular relapse. The LP on 11/26/23 showed CNS molecular and morphologic relapse. The CNS disease cleared with a few doses of IT chemotherapy.   On 12/12/23 she started higher dose ponatinib  30 mg and blinatumomab .   To date she received 12 more doses of IT chemotherapy. She received three cycles of blinatumomab .   As above she remains in complete molecular remission.     ROS:   Ms. Twitty reports doing quite well. c/o ongoing fatigue and weakness. Does get fatigued after walking 6-10 min then will rest and return to normal.No assitive devices.   Can perform all her ADLs with ease. She can do a lot of things such as home work tasks etc, but needs frequent rests. She naps regularly.    Denies fever chills or drenching night sweats.  Denies infections other than an episode of sinusitis for which she was treated just before starting her salvage.   She is eating and drinking well.   Denies N/V/D. Denies abdominal pain.  Has been losing weight.  Treatement made constipation worse, takes stool softeners prn.   Leg pain when active. Struggles to climb stairs.   Overall she has not been very active. She has never exercised. She has never received PT.  Neuropathy, mild, in both toes. Mostly numbness.   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 Follows with local therapist.  ROS was otherwise negative.     Hematology/Oncology History Overview Note   Referring/Local Oncologist:    Diagnosis:   10/29/2021  Bone marrow, left iliac, aspiration and biopsy  -  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 The majority of the abnormal cells (64/88) contained an additional BCR/ABL1 fusion signal, while 9/88 abnormal cells contained an additional ABL1 and ASS1  signal.       Genetics:   Karyotype/FISH:   RESULTS   Date Value Ref Range Status   10/28/2021   Final    NOTE: This report reflects a combined study from a peripheral blood and a bone marrow core biopsy. Eleven cells from the peripheral blood and nine cells from the bone marrow core biopsy were analyzed. The BCR/ABL1 FISH analysis was performed on the peripheral blood.     Abnormal Karyotype:  47,XX,t(9;22)(q34;q11.2),+der(22)t(9;22)[18]/46,XX[2]    Abnormal FISH:  A BCR/ABL1 interphase FISH assay showed a signal pattern consistent with a BCR::ABL1 rearrangement and the 9;22 translocation in 88% of the 100 cells scored. The majority of the abnormal cells (64/88) contained an additional BCR/ABL1 fusion signal, while 9/88 abnormal cells contained an additional ABL1 and ASS1 signal.           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  MRD by flow insufficient, p190 2/100,000  12/10/2021: Cycle 2 B cycle + rituximab   12/14/21: IT#4  12/17/21: IT#5  01/10/22: Cycle 3 A cycle + rituximab   01/11/22: IT #6  01/24/22: Bmbx - MRD-neg by PCR (p190)   02/17/22: C4 B cycle + ritux  02/18/22: IT#7  03/21/22: C5 A cycle + ritux (4th dose)  03/22/22: IT#8  04/29/22: Bmbx - MRD-neg by PCR (p190)   05/18/22: restart dasatinib  70mg  s/p neutropenia  05/26/22: Start maintenance vincristine  2mg , Prednisone  200mg  D1-5   06/02/22: IT #9  06/27/22: C2 Maintenance: vincristine  2mg , Prednisone  200mg  D1-5, HOLD dasatinib  due to neutropenia  07/04/22: RESTART dasatinib  70mg   07/21/22: IT #10  07/26/22: C3 Maintenance: vincristine  2mg , Prednisone  200mg  D1-5, dasatinib  70 mg  08/08/22: IT #11 - prophylaxis completed  08/23/22: C4 Maintenance: vincristine  2mg , Prednisone  200mg  D1-5, dasatinib  70 mg  09/20/22: C5 Maintenance: vincristine  2mg , Prednisone  200mg  D1-5, dasatinib  70 mg  10/04/22: Bone marrow biopsy - continued remission, MRD and BCR/ABL p190 negative  10/18/22: C6 Maintenance. ANC 0.4, HOLD dasatinib , continue vincristine  2mg  and Prednisone  200mg  D1-5  11/15/22: C7 Maintenance ANC 0.4. HOLD TKI, continue vincristine  2mg  and Prednisone  200mg  D1-5  ~11/30/22: Start ponatinib  15 mg daily  12/13/22: C8 Maintenance ANC 1.2. ponatinib  15mg , vincristine  2mg  D1 and Prednisone  200mg  D1-5  01/10/23: C9 Maintenance Anc 0.7. HOLD ponatinib , continue vincristine  2mg  D1 and Prednisone  200mg  D1-5  01/26/23: ANC 1.7, restart ponatinib  15 mg  02/08/23: C10 Maintenance. ANC is 1.4 - continue ponatinib   03/07/23: C11 Maintenance ANC is 1.1. continue ponatinib   04/06/23: C12 Maintenance  04/12/23: Bmbx - continued remission, MRD-negative, BCR/ABL undetectable  05/02/23: C13 Maintenance  05/30/23: C14 Maintenance  06/27/23: C15 Maintenance PB p210 2/100,000  07/23/23: C16 Maintenance PB p210 2/100,000  08/22/23: C17 Maintenance PB p210 4/100,000  09/19/23: C18 Maintenance PB p210 2/100,000  10/16/23: C19 Maintenance - HOLD prednisone  due to hyperglycemia  10/16/23: Bmbx - CR, MRD-neg by flow, p210 47/100,000   11/01/23: Increased ponatinib  to 30 mg daily  11/26/23: CNS relapse - IT chemo   11/30/23: IT chemo - blasts present   12/01/23: Bmbx - MRD+ disease, 0.14% by flow, p190 241/100,000   12/04/23: IT chemo - blasts present   12/07/23: IT chemo - blasts present  12/07/23: IT chemo - no definitive blasts - 1/8 clear   12/12/23: C1D1 Blinatumomab    12/11/23: IT chemo - no blasts - 2/8 clear  12/18/23: IT chemo - no blasts - 3/8 clear   12/27/23: IT chemo - no blasts - 4/8 clear   01/09/24: IT chemo - no blasts - 5/8 clear   01/15/24: Bmbx - MRD negative by flow and p190   01/24/24: IT chemo   01/25/24: Cycle 2 blinatumomab  + ponatinib  30mg  daily  02/22/24: IT chemo  03/06/24: IT chemo  03/07/24: Cycle 3 blinatumomab  + ponatinib  30 mg daily  03/07/24: PB p190 negative  04/04/24: IT chemo  04/11/24: IT chemo  04/18/24: Cycle 4 blinatumomab  + ponatinib  30 mg daily  04/18/24: PB p190 PENDING       Allergies:  Allergies   Allergen Reactions    Other      Pt cant take ibuprofen due to condition.    Sulfa (Sulfonamide Antibiotics) Anaphylaxis     Other reaction(s): Not available    Azithromycin Hives     Per patient report    Erythromycin Hives     Other reaction(s): Not available    Clindamycin      Other Reaction(s): GI discomfort     Metoprolol Other (See Comments)     Other Reaction(s): Edema     Medications:     Current Outpatient Medications:     acetaminophen  (TYLENOL  8 HOUR) 650 MG CR tablet, Take 2 tablets (1,300 mg total) by mouth every eight (8) hours as needed for pain., Disp: , Rfl:     albuterol  HFA 90 mcg/actuation inhaler, Inhale 2 puffs every six (6) hours as needed for wheezing., Disp: 8 g, Rfl: 1    aspirin  (ECOTRIN) 81 MG tablet, Take 1 tablet (81 mg total) by mouth daily., Disp: , Rfl:     blood sugar diagnostic (GLUCOSE BLOOD) Strp, Use to check blood sugar two times a day., Disp: 100 each, Rfl: 0    blood-glucose meter kit, Use to check blood sugar two times a day. Use as instructed., Disp: 1 each, Rfl: 0    blood-glucose sensor (DEXCOM G7 SENSOR) Devi, 1 each by Miscellaneous route every ten (10) days., Disp: 9 each, Rfl: 3    blood-glucose,receiver,cont (DEXCOM G7 RECEIVER) Misc, 1 each by Miscellaneous route once for 1 dose., Disp: 1 each, Rfl: 0    cetirizine  (ZYRTEC ) 10 MG tablet, Take 1 tablet (10 mg total) by mouth nightly., Disp: 90 tablet, Rfl: 3    dapsone  100 MG tablet, Take 1 tablet (100 mg total) by mouth daily. TAKE 1 TABLET(100 MG) BY MOUTH DAILY, Disp: 30 tablet, Rfl: 11    escitalopram  oxalate (LEXAPRO ) 10 MG tablet, Take 1 tablet (10 mg total) by mouth daily., Disp: 90 tablet, Rfl: 2    fluconazole  (DIFLUCAN ) 200 MG tablet, Take 1 tablet (200 mg total) by mouth daily when ANC less than 0.5, Disp: 30 tablet, Rfl: 0    glucagon  spray 3 mg/actuation Spry, Use 1 spray in 1 nostril for severe hypoglycemia, as per package instructions, Disp: 1 each, Rfl: 2    insulin  aspart (NOVOLOG  FLEXPEN) 100 unit/mL (3 mL) injection pen, Use up to 30 units/day, divided TID AC meals as per MD instructions, Disp: 30 mL, Rfl: 12    insulin  glargine (LANTUS  SOLOSTAR U-100 INSULIN ) 100 unit/mL (3 mL) injection pen, Inject 0.22 mL (22 Units total) under the skin nightly., Disp: 3 mL, Rfl: 10    lancets Misc, Use  to check blood sugar as directed 2 times a day & for symptoms of high or low blood sugar., Disp: 100 each, Rfl: 0    letermovir  (PREVYMIS ) 480 mg tablet, Take 1 tablet (480 mg total) by mouth daily., Disp: 28 tablet, Rfl: 5    levoFLOXacin  (LEVAQUIN ) 500 MG tablet, Take 1 tablet (500 mg total) by mouth daily when ANC less than 0.5, Disp: 30 tablet, Rfl: 0    losartan  (COZAAR ) 50 MG tablet, Take 1 tablet (50 mg total) by mouth in the morning., Disp: 100 tablet, Rfl: 3    PONATinib  (ICLUSIG ) 30 mg tablet, Take 1 tablet (30 mg total) by mouth daily. Swallow tablets whole. Do not crush, break, cut or chew tablets., Disp: 30 tablet, Rfl: 5    valACYclovir  (VALTREX ) 500 MG tablet, Take 1 tablet (500 mg total) by mouth daily., Disp: 30 tablet, Rfl: 12    Past Medical History:   Diagnosis Date    Anxiety     B-cell acute lymphoblastic leukemia (ALL)    (CMS-HCC) 10/29/2021    Cytomegalovirus (CMV) viremia    (CMS-HCC)     Diabetes mellitus    (CMS-HCC)     Hypertension    DM diagnosed in March   Type 2 DM. Has been on insulin  since May. Wears the Libre glucose sensors. Blood sugars; fluctuant 250 at night. Has Endocrinologist locally.   Asthma in childhood     Past Surgical History: None.    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   She is now disabled.  Got married in 2023. Lives with her husband, who is very supportive.    They have no children. 2 dogs.  ETOH, socially, small amounts.  Smoking on-off for 20+ years, 1/2 pack/day. Quit 6-7 years ago.  Denies use of illicit drugs.     Siblings: 82 yo sister. Same parents. She has Hashimoto thyroiditis amd various autoimmune disorders.   Parents 36 and 71 yo.     Family History:  Cousin with leukemia in 70-ties. Exposure to carcinogens in [employer].      Objective:   Ht 169 cm (5' 6.54)  - Wt 78.8 kg (173 lb 11.6 oz)  - BMI 27.59 kg/m??  Virtual visit.   Patient looks well. Converses comfortably.   HEENT: eyes conjunctivae pale.  Oral mucosa pink and moist with no lesions and no discharge.   Lymphatics: there is no palpable LAD.  Lungs: bilaterally clear to auscultation.  Heart: regular rate and rhythm, no murmurs and no gallop.  Abdomen: soft, nontender, BS+, no organomegaly.  Extremities: no peripheral edema.  Skin: no ecchymosis. No petechiae.   Neurologic: nonfocal and grossly intact.  Psych: affect appropriate.     Diagnosis: Ph + ALL  DELMY HOLDREN [id number] 48 y.o. Apr 14, 1976   FAV completion date: 02/22/24. Updated 05/29/24  Disease status: Ph + ALLwith CNS involvement in CR MRD Negative currently receiving Ponatinib  and Blinatumomab       Transplant plan and donor information:  Tx type: MUD Match: 10/10 NMDP donor. No related donors.   Conditioning: RIC Flu/Mel Recipient: Blood Type: O+;CMV Status: positive        Comorbidity Index Score:   aaHCT-CI Score=5  80, Normal activity with effort; some signs or symptoms of disease (ECOG equivalent 1)   (1) Arrhythmia  af/afib   (1) Cardiac X cad/chf/mi/ef<50   (1) CVA  tia/cva   (1) Diabetes X treatment   (3) Heart Valve     (  1) Hepa Mild  bili<1.5/ast<2.5   (3)Hepa Severe  bili>1.5/ast>2.5   (1) Infection X abx s/p day 0   (1) Inflam Bowel Disease     (1) Obesity  BMI > 35   (2) Peptic ulcer  treatment   (1) Psych X treatment   (2) Pulm mod X Dlco and/or FEV1 66-80% or dyspnea on slight activity   (3) Pulm severe  dlco <65 or dyspnea at rest or requiring oxygen   (2) Renal  CR>2   (2) Rheum  SLE/RA   (3) Solid tumor  treat xskin   Other Co-morbid     Age 59 and  Greater X     Timed Up and Go Test: 7.2 seconds     6 Minute Walk Test   Distance walked: 1299 feet/396 meters  Oxygen saturation pre-walk/post-walk: 95/95%    Heart rate pre-walk/post-walk: 84/88 bpm Max HR: 132 bpm  Cardiovascular   ECHO: EF 45-50% borderline. There is grade I diastolic dysfunction  The LV global longitudinal strain tracking is suboptimal to accurately report the GLS measurement.     EKG:NSR, prolonged QTc   Cardiology consult: ?  Pulmonary   PFTs: FVC-84%; FEV1-79%, DLCOc ([place])-67.49%  CXR: WNL  Caregiver plan/Concerns   Primary: Devaughn [name], patient's husband. Working fulltime so engineer, production.      Back up:   For daytime and PRN  Alexis Colarusso, friend   Alicia Chistofferson, friend   Giana Castner, patient's mother (lives in [place] but retired and flexible)     TERS score: 34.5  Social concerns: Per SW - She has a history of depression/anxiety and undiagnosed ADHD. She takes Lexapro  for the anxiety/depression and she has access to a therapist she meets with when she needs to. Her anxiety scores were a little high  so she is planning to reach out to her local counselor instead of getting connected to [facility] for more supports.    She lives about 50 minutes away [facility]) wants to return home. Has a friend that lives closer to [facility] if needed for recovery period. No travel/lodging benefit.  Complex case: No    Other concerns/referrals/consults needed:   T2DM:Takes Lantus  and SSI at home.   HTN - HLD: Losartan . She self-discontinued Rosuvastatin .   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 Continues on Letermovir . Undetectable PCRs  Anxiety/Depression: takes Lexapro , therapy as above    Dental: No     Assessment and Plan:   Ms. 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 She is now being treated with blinatumomab , ponatinib  30 mg every day and IT chemotherapy. She remains in complete molecular remission.     **Ph(+)ALL. Associated with p190. CR2. Complete molecular remission by p190.   Immunoglobulin MRD PB positive at the time of relapse on 12/01/23.   To date received 23 doses of IT chemotherapy. I am not sure if she will tolerate any additional IT chemotherapy after transplant. Max 7 doses post-transplant.   Will need BMBx and LP for disease evaluation prior to allo-SCT.   MRD assessment by PCR for BCR-ABL and immunoglobulin prior to transplant.   Consider cranial XRT after allo-SCT.   Consider IT chemotherapy after allo-SCT.   Consider TKI maintenance after allo-SCT. Will see if we can perform BCR-ABL1 mutation analysis on the sample collected at the time of relapse.     **aaHCT-CI 6. High risk. Estimated 2 yr TRM 40%.     **Donor search: 10/10 NMDP donor.  Family donors not suitable donors due to illnesses.     **Preparative regimen: [id number]/[id number] with tac/MTX  Conditioning regimen will consist of:  1. Fludarabine 40 mg/m2 days -5, -4, -3, -2  2. Melphalan 100  mg/m2 day -1   GVHD prophylaxis. Regimen will consist of   1.Tacrolimus starting on day -3 (goal 5-10 ng/mL)  2. Methotrexate  5 mg/m2 IVP on days +1, +3, +6 and +11  3.  ATG per [place] standard dosing will not be included.    **Social support:   Patient's mother will be the primary caregiver for the first two months after discharge. She will later need to return to [place], where she lives.   Patient's husband helps but he works full time thus his availability is limited.   She has a friend Public Librarian who agreed to be a back up caregiver. She has two small dogs and Camellia initially was not sure who would take care of the dogs if she needed to be with [place]. But today Gracin reassures us  that they addressed that issue.   The social work confirmed the caregiver plans are acceptable.   We discussed today that after discharge Guy will need to stay in [facility] until she is very stable. If her transplant course goes well, she may be able to move home early, before D+100. However, if complications arise, she may need to stay in [facility] much longer.  Manuela is very well aware that considering high risk transplant she will likely need to stay in [facility], with her caregiver for 100+ days.    Co-morbidities:   T2DM: Chronic condition. Takes Lantus  and SSI at home.   HTN - HLD: Chronic conditions, taking Losartan  at home. She self-discontinued Rosuvastatin .   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 Continues on Letermovir . CMV PCR negative since mid March.  Anxiety/Depression: Chronic condition, takes Lexapro .   Hx hemorrhoids, Anal fissure: manages with topical meds as needed.   Menses suppression and birth control: previously received Lupron  with last dose 07/26/22, has not had any menses since her last Lupron  dose.  Hypogammaglobulinemia: receiving monthly IVIG.  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 Needs cardiology evaluation.    COPD: PFT showed obstructive pattern as well as low DLCO.     Repeat pre-transplant evaluation tests show acceptable organ function tests and ID tests to proceed to transplant. The social worker confirmed adequate social support including the availability of primary and back up caregiver. Camellia and her husband asked a few questions pertaining to upcoming transplant, which I believe were answered to their satisfaction. She signed all appropriate consent forms.     Farzana Koci J Jarod Bozzo, MD  [facility]

## 2024-05-29 NOTE — Progress Notes (Unsigned)
 [facility]ukemia [facility] Follow-up    [name]  Patient Age: 48 y.o.  Encounter Date: 05/29/2024    Primary Care Provider:  Estelle Toribio SAUNDERS, MD    Referring Physician:  Estelle Toribio SAUNDERS, MD  [address]  [place],  [place] [address]      Cancer Diagnosis: Ph+ B-ALL; Initial Dx 10/29/2021  Cancer status: CR2, MRD negative, undetectable BCR/ABL <1/100,000 01/15/24  Treatment Regimen: Second Line, Blinatumumab (C1D1 12/12/23- )+ Ponatinib .                                      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 Allogeneic stem cell transplant planning ongoing.   - Anticipate transition to alloHSCT  - Continue ponatinib  30 mg daily until directed by transplant team     Recurrent acute sinusitis  Improved. Recurrent acute sinusitis with nasal congestion, frontal headache, and low-grade fever. Negative COVID and respiratory pathogen panel. Responded well to Augmentin .  - Monitor.    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 Echocardiogram planned to assess cardiac function. Monitoring QTc interval for medication adjustments.  - Continue losartan .  - Follow up with cardiology.  - Monitor QTc interval and adjust medications as needed.    Hypertension  Hypertension managed with losartan . Ponatinib  may contribute to hypertension. Coordinating with cardiology for management.  - Continue losartan .  - Coordinate with cardiology for hypertension management.    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   -09/2022 restarted Valcyte  with the goal of controlling CMV and limiting neutropenia  - continue letermovir   - d/c CMV levels as of 03/2023 - per Dr. Sheena  - 09/19/23 - CMV negative - checked due to elevated temps     Hemorrhoids, Anal fissure: persistent but stable, per patient  - supportive care at home     Menses suppression and birth control: previously received Lupron  with last dose 07/26/22   - patient stopped oral contraceptive due to weight loss. Counseled on importance of birth control on TKI. She is abstinent and we have previously discused making an appointment with GYN     Psychosocial distress: She reports a moderate level of anxiety regarding the management of the above.   - Counseling given   - Consider ref to comprehensive cancer support program      Patient-centered care/Shared decision-making:   We discussed the plan above at length. The patient and her husband actively contributed to the conversation. Specifically, her most important outcomes are:   -Prolonging overall survival and Maintaining her overall functional activity      Supportive Care Needs: We recommend based on the patient???s underlying diagnosis and treatment history the following supportive care:    RTC in  1 month with labs if transplant is still pending     1. Antimicrobial prophylaxis:    Viral - valacyclovir  and letermovir   Bacterial: Levofloxacin  when ANC <0.5  Fungal: Fluconazole  200mg  when ANC <0.5  PJP: dapsone  100mg  daily  Pt declined flu shot     2. Blood product support:  Leukoreduced blood products are required.  Irradiated blood products are preferred, but in case of urgent transfusion needs non-irradiated blood products may be used: 2 units for Hg <=8.0.      Toribio Bunker, MD  Leukemia Program  Division of Hematology  [facility]      History of Present Illness:  Hematology/Oncology History Overview Note   Referring/Local Oncologist:    Diagnosis:   10/29/2021  Bone marrow, left iliac, aspiration and biopsy  -  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 The majority of the abnormal cells (64/88) contained an additional BCR/ABL1 fusion signal, while 9/88 abnormal cells contained an additional ABL1 and ASS1 signal.       Genetics:   Karyotype/FISH:   RESULTS   Date Value Ref Range Status   10/28/2021   Final    NOTE: This report reflects a combined study from a peripheral blood and a bone marrow core biopsy. Eleven cells from the peripheral blood and nine cells from the bone marrow core biopsy were analyzed. The BCR/ABL1 FISH analysis was performed on the peripheral blood.     Abnormal Karyotype:  47,XX,t(9;22)(q34;q11.2),+der(22)t(9;22)[18]/46,XX[2]    Abnormal FISH:  A BCR/ABL1 interphase FISH assay showed a signal pattern consistent with a BCR::ABL1 rearrangement and the 9;22 translocation in 88% of the 100 cells scored. The majority of the abnormal cells (64/88) contained an additional BCR/ABL1 fusion signal, while 9/88 abnormal cells contained an additional ABL1 and ASS1 signal.           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  MRD by flow insufficient, p190 2/100,000  12/10/2021: Cycle 2 B cycle + rituximab   12/14/21: IT#4  12/17/21: IT#5  01/10/22: Cycle 3 A cycle + rituximab   01/11/22: IT #6  01/24/22: Bmbx - MRD-neg by PCR (p190)   02/17/22: C4 B cycle + ritux  02/18/22: IT#7  03/21/22: C5 A cycle + ritux (4th dose)  03/22/22: IT#8  04/29/22: Bmbx - MRD-neg by PCR (p190)   05/18/22: restart dasatinib  70mg  s/p neutropenia  05/26/22: Start maintenance vincristine  2mg , Prednisone  200mg  D1-5   06/02/22: IT #9  06/27/22: C2 Maintenance: vincristine  2mg , Prednisone  200mg  D1-5, HOLD dasatinib  due to neutropenia  07/04/22: RESTART dasatinib  70mg   07/21/22: IT #10  07/26/22: C3 Maintenance: vincristine  2mg , Prednisone  200mg  D1-5, dasatinib  70 mg  08/08/22: IT #11 - prophylaxis completed  08/23/22: C4 Maintenance: vincristine  2mg , Prednisone  200mg  D1-5, dasatinib  70 mg  09/20/22: C5 Maintenance: vincristine  2mg , Prednisone  200mg  D1-5, dasatinib  70 mg  10/04/22: Bone marrow biopsy - continued  remission, MRD and BCR/ABL p190 negative  10/18/22: C6 Maintenance. ANC 0.4, HOLD dasatinib , continue vincristine  2mg  and Prednisone  200mg  D1-5  11/15/22: C7 Maintenance ANC 0.4. HOLD TKI, continue vincristine  2mg  and Prednisone  200mg  D1-5  ~11/30/22: Start ponatinib  15 mg daily  12/13/22: C8 Maintenance ANC 1.2. ponatinib  15mg , vincristine  2mg  D1 and Prednisone  200mg  D1-5  01/10/23: C9 Maintenance Anc 0.7. HOLD ponatinib , continue vincristine  2mg  D1 and Prednisone  200mg  D1-5  01/26/23: ANC 1.7, restart ponatinib  15 mg  02/08/23: C10 Maintenance. ANC is 1.4 - continue ponatinib   03/07/23: C11 Maintenance ANC is 1.1. continue ponatinib   04/06/23: C12 Maintenance  04/12/23: Bmbx - continued remission, MRD-negative, BCR/ABL undetectable  05/02/23: C13 Maintenance  05/30/23: C14 Maintenance  06/27/23: C15 Maintenance PB p210 2/100,000  07/23/23: C16 Maintenance PB p210 2/100,000  08/22/23: C17 Maintenance PB p210 4/100,000  09/19/23: C18 Maintenance PB p210 2/100,000  10/16/23: C19 Maintenance - HOLD prednisone  due to hyperglycemia  10/16/23: Bmbx - CR, MRD-neg by flow, p210 47/100,000   11/01/23: Increased ponatinib  to 30 mg daily  11/26/23: CNS relapse - IT chemo   11/30/23: IT chemo - blasts present   12/01/23: Bmbx - MRD+ disease, 0.14% by flow, p190 241/100,000   12/04/23: IT chemo - blasts present   12/07/23: IT chemo - blasts present   12/07/23: IT chemo - no definitive blasts - 1/8 clear   12/12/23: C1D1 Blinatumomab    12/11/23: IT chemo - no blasts - 2/8 clear  12/18/23: IT chemo - no blasts - 3/8 clear   12/27/23: IT chemo - no blasts - 4/8 clear   01/09/24: IT chemo - no blasts - 5/8 clear   01/15/24: Bmbx - MRD negative by flow and p190   01/24/24: IT chemo   01/25/24: Cycle 2 blinatumomab  + ponatinib  30mg  daily  02/22/24: IT chemo  03/06/24: IT chemo  03/07/24: Cycle 3 blinatumomab  + ponatinib  30 mg daily  03/07/24: PB p190 negative  04/04/24: IT chemo  04/11/24: IT chemo  04/18/24: Cycle 4 blinatumomab  + ponatinib  30 mg daily  04/18/24: PB p190 negative  05/21/24: Bmbx - MRD-neg by clonoseq, p190 negative      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)    (CMS-HCC)   10/29/2021 Initial Diagnosis    B-cell acute lymphoblastic leukemia (ALL) (CMS-HCC)     10/30/2021 - 03/30/2022 Chemotherapy    IP/OP LEUKEMIA GRAAPH-2005 < 60 YO (OP PEGFILGRASTIM  ON DAY 7)  [No description for this plan]     01/06/2022 - 04/07/2022 Endocrine/Hormone Therapy    OP LEUPROLIDE  (LUPRON ) 11.25 MG EVERY 3 MONTHS  Plan Provider: Toribio JONELLE Bunker, MD     05/26/2022 -  Chemotherapy    OP LEUKEMIA VINCRISTINE   vinCRIStine  2 mg IV on day 1     07/26/2022 - 07/26/2022 Endocrine/Hormone Therapy    OP LEUPROLIDE  (LUPRON ) 11.25 MG EVERY 3 MONTHS  Plan Provider: Toribio JONELLE Bunker, MD           Interim hx:   Doing great. Likes being off blina. Excited about transplant. No fevers, chills, or other infectious issues. Sinusitis improving.       Past Medical History:  Past Medical History[1]    Medications:  Current Medications[2]    Vital Signs:  BSA: 1.93 meters squared  Vitals:    05/29/24 1235   BP: 126/96   Pulse: 79   Resp: 18   Temp: 36.6 ??C (97.8 ??F)   SpO2: 97%           Physical  Exam:  Objective:     ECOG Performance Status: 1 - Restricted in physically strenuous activity but ambulatory and able to carry out work of a light or sedentary nature, e.g., light house work, office work    Physical Exam:  GENERAL: Well-appearing woman accompanied by her husband.  HEART: Normal color, not excessive pallor.  CHEST/LUNG: Normal work of breathing. No dyspnea with conversation.  ABDOMEN: No obvious distention.    EXTREMITIES: No edema, cyanosis or clubbing.   SKIN: No noticable rash or petechiae.   NEURO EXAM: Grossly intact.       Relevant Laboratory, radiology and pathology results:  Reviewed cbc, cmp, bmbx w/her and her husband           [1]   Past Medical History:  Diagnosis Date    Anxiety     B-cell acute lymphoblastic leukemia (ALL)    (CMS-HCC) 10/29/2021    Cytomegalovirus (CMV) viremia    (CMS-HCC)     Diabetes mellitus    (CMS-HCC)     Hypertension    [2]   Current Outpatient Medications   Medication Sig Dispense Refill    acetaminophen  (TYLENOL  8 HOUR) 650 MG CR tablet Take 2 tablets (1,300 mg total) by mouth every eight (8) hours as needed for pain.      albuterol  HFA 90 mcg/actuation inhaler Inhale 2 puffs every six (6) hours as needed for wheezing. 8 g 1    amoxicillin -clavulanate (AUGMENTIN ) 875-125 mg per tablet Take 1 tablet by mouth two (2) times a day for 10 days. 20 tablet 0    aspirin  (ECOTRIN) 81 MG tablet Take 1 tablet (81 mg total) by mouth daily.      blood sugar diagnostic (GLUCOSE BLOOD) Strp Use to check blood sugar two times a day. 100 each 0    blood-glucose meter kit Use to check blood sugar two times a day. Use as instructed. 1 each 0    blood-glucose sensor (DEXCOM G7 SENSOR) Devi 1 each by Miscellaneous route every ten (10) days. 9 each 3    blood-glucose,receiver,cont (DEXCOM G7 RECEIVER) Misc 1 each by Miscellaneous route once for 1 dose. 1 each 0    cetirizine  (ZYRTEC ) 10 MG tablet Take 1 tablet (10 mg total) by mouth nightly. 90 tablet 3    dapsone  100 MG tablet Take 1 tablet (100 mg total) by mouth daily. TAKE 1 TABLET(100 MG) BY MOUTH DAILY 30 tablet 11    escitalopram  oxalate (LEXAPRO ) 10 MG tablet Take 1 tablet (10 mg total) by mouth daily. 90 tablet 2    fluconazole  (DIFLUCAN ) 200 MG tablet Take 1 tablet (200 mg total) by mouth daily when ANC less than 0.5 30 tablet 0    glucagon  spray 3 mg/actuation Spry Use 1 spray in 1 nostril for severe hypoglycemia, as per package instructions 1 each 2    insulin  aspart (NOVOLOG  FLEXPEN) 100 unit/mL (3 mL) injection pen Use up to 30 units/day, divided TID AC meals as per MD instructions 30 mL 12    insulin  glargine (LANTUS  SOLOSTAR U-100 INSULIN ) 100 unit/mL (3 mL) injection pen Inject 0.22 mL (22 Units total) under the skin nightly. 3 mL 10    lancets Misc Use to check blood sugar as directed 2 times a day & for symptoms of high or low blood sugar. 100 each 0    letermovir  (PREVYMIS ) 480 mg tablet Take 1 tablet (480 mg total) by mouth daily. 28 tablet 5    levoFLOXacin  (LEVAQUIN ) 500 MG tablet  Take 1 tablet (500 mg total) by mouth daily when ANC less than 0.5 30 tablet 0    losartan  (COZAAR ) 50 MG tablet Take 1 tablet (50 mg total) by mouth in the morning. 100 tablet 3    PONATinib  (ICLUSIG ) 30 mg tablet Take 1 tablet (30 mg total) by mouth daily. Swallow tablets whole. Do not crush, break, cut or chew tablets. 30 tablet 5    valACYclovir  (VALTREX ) 500 MG tablet Take 1 tablet (500 mg total) by mouth daily. 30 tablet 12     No current facility-administered medications for this visit. undetectable in bone marrow (01/15/2024)  BCR/ABL1 P190: undetectable in bone marrow (01/15/2024)  BCR/ABL1 P190: undetectable in peripheral blood (03/07/2024)    No results found for this or any previous visit (from the past 24 hours).                               [1]   Past Medical History:  Diagnosis Date    Anxiety     B-cell acute lymphoblastic leukemia (ALL)    (CMS-HCC) 10/29/2021    Cytomegalovirus (CMV) viremia    (CMS-HCC)     Diabetes mellitus    (CMS-HCC)     Hypertension    [2]   Current Outpatient Medications   Medication Sig Dispense Refill    acetaminophen  (TYLENOL  8 HOUR) 650 MG CR tablet Take 2 tablets (1,300 mg total) by mouth every eight (8) hours as needed for pain.      albuterol  HFA 90 mcg/actuation inhaler Inhale 2 puffs every six (6) hours as needed for wheezing. 8 g 1    aspirin  (ECOTRIN) 81 MG tablet Take 1 tablet (81 mg total) by mouth daily.      blood sugar diagnostic (GLUCOSE BLOOD) Strp Use to check blood sugar two times a day. 100 each 0    blood-glucose meter kit Use to check blood sugar two times a day. Use as instructed. 1 each 0    blood-glucose sensor (DEXCOM G7 SENSOR) Devi 1 each by Miscellaneous route every ten (10) days. 9 each 3    blood-glucose,receiver,cont (DEXCOM G7 RECEIVER) Misc 1 each by Miscellaneous route once for 1 dose. 1 each 0    cetirizine  (ZYRTEC ) 10 MG tablet Take 1 tablet (10 mg total) by mouth nightly. 90 tablet 3    dapsone  100 MG tablet Take 1 tablet (100 mg total) by mouth daily. TAKE 1 TABLET(100 MG) BY MOUTH DAILY 30 tablet 11    escitalopram  oxalate (LEXAPRO ) 10 MG tablet Take 1 tablet (10 mg total) by mouth daily. 90 tablet 2    fluconazole  (DIFLUCAN ) 200 MG tablet Take 1 tablet (200 mg total) by mouth daily when ANC less than 0.5 30 tablet 0    glucagon  spray 3 mg/actuation Spry Use 1 spray in 1 nostril for severe hypoglycemia, as per package instructions 1 each 2    insulin  aspart (NOVOLOG  FLEXPEN) 100 unit/mL (3 mL) injection pen Use up to 30 units/day, divided TID AC meals as per MD instructions 30 mL 12    insulin  glargine (LANTUS  SOLOSTAR U-100 INSULIN ) 100 unit/mL (3 mL) injection pen Inject 0.22 mL (22 Units total) under the skin nightly. 3 mL 10    lancets Misc Use to check blood sugar as directed 2 times a day & for symptoms of high or low blood sugar. 100 each 0    letermovir  (PREVYMIS ) 480 mg tablet Take 1  tablet (480 mg total) by mouth daily. 28 tablet 5    levoFLOXacin  (LEVAQUIN ) 500 MG tablet Take 1 tablet (500 mg total) by mouth daily when ANC less than 0.5 30 tablet 0    losartan  (COZAAR ) 50 MG tablet Take 1 tablet (50 mg total) by mouth in the morning. 100 tablet 3    PONATinib  (ICLUSIG ) 30 mg tablet Take 1 tablet (30 mg total) by mouth daily. Swallow tablets whole. Do not crush, break, cut or chew tablets. 30 tablet 5    valACYclovir  (VALTREX ) 500 MG tablet Take 1 tablet (500 mg total) by mouth daily. 30 tablet 12     No current facility-administered medications for this visit.

## 2024-05-29 NOTE — Progress Notes (Signed)
 Double height and weight verified with Miranda.  Height: 169 cm  Weight: 78.8 kgs

## 2024-05-30 DIAGNOSIS — Z7682 Awaiting organ transplant status: Principal | ICD-10-CM

## 2024-05-30 DIAGNOSIS — C9100 Acute lymphoblastic leukemia not having achieved remission: Principal | ICD-10-CM

## 2024-05-30 LAB — CMV IGG: CMV IGG: POSITIVE — AB

## 2024-05-30 NOTE — Progress Notes (Unsigned)
 OUTPATIENT ONCOLOGY PALLIATIVE CARE    Principal Diagnosis: [name] is a 48 y.o. 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 Initially diagnosed in March 2023 and started on therapy on GRAAPH protocol. Recently in maintence when she presented with CNS relapse. BMBx shows MRD+ and therefore started on Blinatumomab , planning for bone marrow transplant.     Assessment/Plan:     #Anxiety/Coping Support: Increased anxiety with upcoming BMTx. Most worried about her body rejecting the transplant and being immunocompromised. Looking forward to feeling more normal afterward. Uses excellent coping strategies. Briefly stopped lexapro  but then restarted when noticed worsening ability to cope and more frequent emotional breakdowns.   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 Worse when cold. 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   - capsaicin cream PRN  - She also is experimenting herbal salve that she made  - could also rotate from lexapro  to duloxetine in the future if more severe    #Cancer-Related Pain: Stable aside from recent LP pain. HAs/Muscle aches intermittnetly. Responsive to tylenol , excedrine, and tizanidine  which she takes very intermittently.   - Cont tylenol  PRN  - Cont tizandine  PRN - has not been very helpful so low threshold to discontinue    #Mild Nausea/Diarrhea:Mild, per patient, suspects related to blinatumomab .   - CTM    #Hyperglycemia/DM: Stable. Switched to [facility] endo.   - Stopped taking metformin  due to side effects of muscle pain/tinnitus  - Insulin  per endocrine - she is hoping to get more education on how to better manage    #Cancer-Related Fatigue: Stable. Able to be more active, doing more houesehold work in prep for [employer].   - Encouraged light exercise as has not engaged in this as much lately  - Future Considerations: American Ginseng     #Vision Issues: Improved. Saw eye doctor recently and discovered prescription issue with old glasses. Now improving.   - Mngt per Optho    #Headaches: Still with intermittent sinus pressure issues, but severe HAs from before no longer present. Presented to ED 10/2023 with worsening headaches, found to have CNS relapse of B-ALL, started on IT chemotherapy which improved  - Continue treatment of underlying B-ALL, planning for bone marrow transplant  - Intermittent abx for sinus infection per PCP or oncology      #Advanced Care Planning:  Recent CNS relapse, She is planning to proceed with bone marrow transplant next week. She is nervous but feels good about this decision.      Prior Convos:  - She and her husband have an excellent understanding of the current course of her treatment.  She recognizes that she is responding well and there is some uncertainty about how many rounds of treatment she will have to go through.  Has heard that the goal of treatment is curative, and she remains very hopeful for this.  Has been able to tolerate treatment fairly well to this point. Her major concern is about side effects from lumbar punctures.  Current focus is on cancer treatment and survival while maintaining quality of life is much as possible.    Health Care Decision Maker as of  05/30/2024    HCDM (patient stated preference): Patricia [name] - Spouse - [phone]      # Controlled substances risk management.   - Patient does not have a signed pain medication agreement with our team.  No controlled substances being prescribed by our team currently.    - [place] database was reviewed today and it was appropriate.   - Urine drug screen was not performed at this visit. Findings: not applicable.   - Patient has received information about safe storage and administration of medications.   - Patient has not received a prescription for narcan; is not applicable.       F/u: ~12-14 weeks    ----------------------------------------  Referring Provider: From inpatient palliative care team  Oncology Team: Malignant hematology  PCP: Estelle Toribio SAUNDERS, MD      Interval Hx 05/31/24:    Since our last visit, patient has finalized plans for bone marrow transplant - scheduled for admission next Friday, Nov 7.     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    She is preparing for a bone marrow transplant scheduled for next week. She feels nervous about the procedure, particularly concerning the potential for transplant rejection. She finds reassurance in the fact that the donor is of the same blood type. She is concerned about not being home for Christmas but plans to have her husband visit her in the hospital. She is actively preparing for her hospital stay, packing her suitcase and planning activities to keep herself occupied, including arts and crafts, reading, and maintaining her physical activity as much as possible.    She has been experiencing vision issues and recently visited an eye doctor, who told her that there was no evidence of diabetic or leukemia-related damage to her eyes. She had previously received incorrect prescriptions, which were corrected during this visit. She now has new reading glasses and plans to get new distance glasses when affordable.    She reports neuropathy in her toes, particularly the pinky toes, which worsens with cold. She attributes this to her diabetes and is experimenting with topical treatments to alleviate the symptoms. Her hands are not affected by neuropathy.    She has a history of lumbar punctures, with recent procedures being difficult due to scar tissue. She experienced significant discomfort during the last attempt, which was ultimately unsuccessful. She is relieved that she will not need another lumbar puncture before her upcoming admission.    She experiences sinus infection headaches, with pain located between her eyes, behind her nose, and into her cheekbones, occasionally extending down her neck. She rarely has a fever with these infections.     She describes episodes of nausea and diarrhea, particularly after taking her morning medications, which include cetirizine , Lexapro , losartan , etc but overall notes that these symptoms are mild and self-resolve.       HPI: Ph+ ALL, currently in remission.    Current cancer-directed therapy: maintenance vincristine  2g, Prednisone  200mg  D1-4 +dasatinib        Palliative Performance Scale: 90% - Ambulation: Full / Normal Activity, some evidence of disease / Self-Care:Full / Intake: Normal / Level of Conscious: Full      Coping/Support Issues: She and her husband have multiple stressors but overall feels like she is coping fairly well right now.  She will reach out to our palliative care  team if new concerns emerge.    Goals of Care:  Focus on cancer directed therapy and survival while hoping to maintain her quality of life is much as possible    Social History:   Name of primary support: Husband [name]  occupation: 6 grade teacher      Advance Care Planning:   HCPOA: Spouse  Natural surrogate decision maker: Husband [name]  Living Will: no    Objective       Hematology/Oncology History Overview Note   Referring/Local Oncologist:    Diagnosis:   10/29/2021  Bone marrow, left iliac, aspiration and biopsy  -  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 The majority of the abnormal cells (64/88) contained an additional BCR/ABL1 fusion signal, while 9/88 abnormal cells contained an additional ABL1 and ASS1 signal.       Genetics:   Karyotype/FISH:   RESULTS   Date Value Ref Range Status   10/28/2021   Final    NOTE: This report reflects a combined study from a peripheral blood and a bone marrow core biopsy. Eleven cells from the peripheral blood and nine cells from the bone marrow core biopsy were analyzed. The BCR/ABL1 FISH analysis was performed on the peripheral blood.     Abnormal Karyotype:  47,XX,t(9;22)(q34;q11.2),+der(22)t(9;22)[18]/46,XX[2]    Abnormal FISH:  A BCR/ABL1 interphase FISH assay showed a signal pattern consistent with a BCR::ABL1 rearrangement and the 9;22 translocation in 88% of the 100 cells scored. The majority of the abnormal cells (64/88) contained an additional BCR/ABL1 fusion signal, while 9/88 abnormal cells contained an additional ABL1 and ASS1 signal.           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  MRD by flow insufficient, p190 2/100,000  12/10/2021: Cycle 2 B cycle + rituximab   12/14/21: IT#4  12/17/21: IT#5  01/10/22: Cycle 3 A cycle + rituximab   01/11/22: IT #6  01/24/22: Bmbx - MRD-neg by PCR (p190)   02/17/22: C4 B cycle + ritux  02/18/22: IT#7  03/21/22: C5 A cycle + ritux (4th dose)  03/22/22: IT#8  04/29/22: Bmbx - MRD-neg by PCR (p190)   05/18/22: restart dasatinib  70mg  s/p neutropenia  05/26/22: Start maintenance vincristine  2mg , Prednisone  200mg  D1-5   06/02/22: IT #9  06/27/22: C2 Maintenance: vincristine  2mg , Prednisone  200mg  D1-5, HOLD dasatinib  due to neutropenia  07/04/22: RESTART dasatinib  70mg   07/21/22: IT #10  07/26/22: C3 Maintenance: vincristine  2mg , Prednisone  200mg  D1-5, dasatinib  70 mg  08/08/22: IT #11 - prophylaxis completed  08/23/22: C4 Maintenance: vincristine  2mg , Prednisone  200mg  D1-5, dasatinib  70 mg  09/20/22: C5 Maintenance: vincristine  2mg , Prednisone  200mg  D1-5, dasatinib  70 mg  10/04/22: Bone marrow biopsy - continued remission, MRD and BCR/ABL p190 negative  10/18/22: C6 Maintenance. ANC 0.4, HOLD dasatinib , continue vincristine  2mg  and Prednisone  200mg  D1-5  11/15/22: C7 Maintenance ANC 0.4. HOLD TKI, continue vincristine  2mg  and  Prednisone  200mg  D1-5  ~11/30/22: Start ponatinib  15 mg daily  12/13/22: C8 Maintenance ANC 1.2. ponatinib  15mg , vincristine  2mg  D1 and Prednisone  200mg  D1-5  01/10/23: C9 Maintenance Anc 0.7. HOLD ponatinib , continue vincristine  2mg  D1 and Prednisone  200mg  D1-5  01/26/23: ANC 1.7, restart ponatinib  15 mg  02/08/23: C10 Maintenance. ANC is 1.4 - continue ponatinib   03/07/23: C11 Maintenance ANC is 1.1. continue ponatinib   04/06/23: C12 Maintenance  04/12/23: Bmbx - continued remission, MRD-negative, BCR/ABL undetectable  05/02/23: C13 Maintenance  05/30/23: C14 Maintenance  06/27/23: C15 Maintenance PB p210 2/100,000  07/23/23: C16 Maintenance PB p210 2/100,000  08/22/23: C17 Maintenance PB p210 4/100,000  09/19/23: C18 Maintenance PB p210 2/100,000  10/16/23: C19 Maintenance - HOLD prednisone  due to hyperglycemia  10/16/23: Bmbx - CR, MRD-neg by flow, p210 47/100,000   11/01/23: Increased ponatinib  to 30 mg daily  11/26/23: CNS relapse - IT chemo   11/30/23: IT chemo - blasts present   12/01/23: Bmbx - MRD+ disease, 0.14% by flow, p190 241/100,000   12/04/23: IT chemo - blasts present   12/07/23: IT chemo - blasts present   12/07/23: IT chemo - no definitive blasts - 1/8 clear   12/12/23: C1D1 Blinatumomab    12/11/23: IT chemo - no blasts - 2/8 clear  12/18/23: IT chemo - no blasts - 3/8 clear   12/27/23: IT chemo - no blasts - 4/8 clear   01/09/24: IT chemo - no blasts - 5/8 clear   01/15/24: Bmbx - MRD negative by flow and p190   01/24/24: IT chemo   01/25/24: Cycle 2 blinatumomab  + ponatinib  30mg  daily  02/22/24: IT chemo  03/06/24: IT chemo  03/07/24: Cycle 3 blinatumomab  + ponatinib  30 mg daily  03/07/24: PB p190 negative  04/04/24: IT chemo  04/11/24: IT chemo  04/18/24: Cycle 4 blinatumomab  + ponatinib  30 mg daily  04/18/24: PB p190 PENDING     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)    (CMS-HCC)   10/29/2021 Initial Diagnosis    B-cell acute lymphoblastic leukemia (ALL) (CMS-HCC)     10/30/2021 - 03/30/2022 Chemotherapy    IP/OP LEUKEMIA GRAAPH-2005 < 60 YO (OP PEGFILGRASTIM  ON DAY 7)  [No description for this plan]     01/06/2022 - 04/07/2022 Endocrine/Hormone Therapy    OP LEUPROLIDE  (LUPRON ) 11.25 MG EVERY 3 MONTHS  Plan Provider: Toribio JONELLE Bunker, MD     05/26/2022 -  Chemotherapy    OP LEUKEMIA VINCRISTINE   vinCRIStine  2 mg IV on day 1     07/26/2022 - 07/26/2022 Endocrine/Hormone Therapy    OP LEUPROLIDE  (LUPRON ) 11.25 MG EVERY 3 MONTHS  Plan Provider: Toribio JONELLE Bunker, MD           REVIEW OF SYSTEMS:  A comprehensive review of 10 systems was negative except for pertinent positives noted in HPI.      PHYSICAL EXAM: Video visit  There were no vitals filed for this visit.    GEN: Awake and alert, pleasant appearing female in no acute distress. Hair is growing back and in pig tails  LUNGS: No increased work of breathing on room air, talking in full sentences.  PSYCH: Alert and oriented to person, place and time. Excited when talking about her projects.  SKIN: No rashes, petechiae or jaundice noted        The patient reports they are physically located in [place]  and is currently: at home. I conducted a audio/video visit. I spent  41m 28s on the video call with the patient.  I spent an additional 20 minutes on pre- and post-visit activities on the date of service .     Alyce Gola, MD  [facility] Palliative Care

## 2024-05-31 ENCOUNTER — Encounter
Admit: 2024-05-31 | Discharge: 2024-06-01 | Payer: PRIVATE HEALTH INSURANCE | Attending: Student in an Organized Health Care Education/Training Program | Primary: Student in an Organized Health Care Education/Training Program

## 2024-05-31 DIAGNOSIS — F419 Anxiety disorder, unspecified: Principal | ICD-10-CM

## 2024-05-31 DIAGNOSIS — T451X5A Adverse effect of antineoplastic and immunosuppressive drugs, initial encounter: Principal | ICD-10-CM

## 2024-05-31 DIAGNOSIS — C9100 Acute lymphoblastic leukemia not having achieved remission: Principal | ICD-10-CM

## 2024-05-31 DIAGNOSIS — J329 Chronic sinusitis, unspecified: Principal | ICD-10-CM

## 2024-05-31 DIAGNOSIS — R530 Neoplastic (malignant) related fatigue: Principal | ICD-10-CM

## 2024-05-31 DIAGNOSIS — G893 Neoplasm related pain (acute) (chronic): Principal | ICD-10-CM

## 2024-05-31 DIAGNOSIS — Z515 Encounter for palliative care: Principal | ICD-10-CM

## 2024-05-31 DIAGNOSIS — G620 Drug-induced polyneuropathy: Principal | ICD-10-CM

## 2024-05-31 MED ORDER — AMOXICILLIN 875 MG-POTASSIUM CLAVULANATE 125 MG TABLET
ORAL_TABLET | Freq: Two times a day (BID) | ORAL | 0 refills | 10.00000 days | Status: CP
Start: 2024-05-31 — End: 2024-06-10

## 2024-06-03 DIAGNOSIS — C9100 Acute lymphoblastic leukemia not having achieved remission: Principal | ICD-10-CM

## 2024-06-03 MED FILL — ICLUSIG 30 MG TABLET: ORAL | 30 days supply | Qty: 30 | Fill #1

## 2024-06-04 DIAGNOSIS — C9100 Acute lymphoblastic leukemia not having achieved remission: Principal | ICD-10-CM

## 2024-06-04 NOTE — Progress Notes (Cosign Needed)
Allogeneic Pre-admission Counseling    HCT Physician: Dr. Jeaneen    Patient Information:  Patricia [name] is a 48 y.o. 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 She presents to clinic today for chemotherapy education in preparation for her upcoming [facility] allogeneic stem cell transplant.    Patricia [name] presents to clinic alone today. She is feeling overall well but does report feeling more anxious in preparation for transplant admission. She continues on lexapro  and is also requesting refill on lorazepam  to use on as needed basis. Last dose of ponatinib  was on 06/03/24. She reports tolerating her most recent chemotherapy relatively well aside from fatigue. In the past, she experienced low blood counts with dasatinib . She also experienced infections with recurrent sinusitis and CMV viremia. She is followed by [facility] and she remains on letermovir  prophylaxis. She was started on Augmentin  on 10/31 for sinus infection with plan to complete 10 day course of therapy. She has also been receiving IVIG monthly (last dose on 05/16/24). She denies nausea with prior chemotherapy but has had several episodes of vomiting in the past 3 weeks that occurred while having a bowel movement. 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 Most recent ECHO on 05/23/24 with EF 55-60%. She uses Dexcom monitor at home and would like to continue using it during hospitalization as she reports that her blood sugars get low at night from time to time. She is currently taking lantus  in the mornings and novolog  with meals based on carbohydrate counting (uses about 1 unit per 8 grams of carbs). She reports making her own herbal products including lip balm and essential oil nasal inhaler (peppermint, eucalyptus and lavender). We discussed that it would be fine to continue using lip balm but that we will hold nasal inhaler during hospitalization to minimize risk of infections. She received lupron  for menses suppression in the past (last dose in Dec 2023 and no menses since that time).     Hematology/Oncology History Overview Note   Referring/Local Oncologist:    Diagnosis:   10/29/2021  Bone marrow, left iliac, aspiration and biopsy  -  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 The majority of the abnormal cells (64/88) contained an additional BCR/ABL1 fusion signal, while 9/88 abnormal cells contained an additional ABL1 and ASS1 signal.       Genetics:   Karyotype/FISH:   RESULTS   Date Value Ref Range Status   10/28/2021   Final    NOTE: This report reflects a combined study from a peripheral blood and a bone marrow core biopsy. Eleven cells from the peripheral blood and nine cells from the bone marrow core biopsy were analyzed. The BCR/ABL1 FISH analysis was performed on the peripheral blood.     Abnormal Karyotype:  47,XX,t(9;22)(q34;q11.2),+der(22)t(9;22)[18]/46,XX[2]    Abnormal FISH:  A BCR/ABL1 interphase FISH assay showed a signal pattern consistent with a BCR::ABL1 rearrangement and the 9;22 translocation in 88% of the 100 cells scored. The majority of the abnormal cells (64/88) contained an additional BCR/ABL1 fusion signal, while 9/88 abnormal cells contained an additional ABL1 and  ASS1 signal.           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  MRD by flow insufficient, p190 2/100,000  12/10/2021: Cycle 2 B cycle + rituximab   12/14/21: IT#4  12/17/21: IT#5  01/10/22: Cycle 3 A cycle + rituximab   01/11/22: IT #6  01/24/22: Bmbx - MRD-neg by PCR (p190)   02/17/22: C4 B cycle + ritux  02/18/22: IT#7  03/21/22: C5 A cycle + ritux (4th dose)  03/22/22: IT#8  04/29/22: Bmbx - MRD-neg by PCR (p190)   05/18/22: restart dasatinib  70mg  s/p neutropenia  05/26/22: Start maintenance vincristine  2mg , Prednisone  200mg  D1-5   06/02/22: IT #9  06/27/22: C2 Maintenance: vincristine  2mg , Prednisone  200mg  D1-5, HOLD dasatinib  due to neutropenia  07/04/22: RESTART dasatinib  70mg   07/21/22: IT #10  07/26/22: C3 Maintenance: vincristine  2mg , Prednisone  200mg  D1-5, dasatinib  70 mg  08/08/22: IT #11 - prophylaxis completed  08/23/22: C4 Maintenance: vincristine  2mg , Prednisone  200mg  D1-5, dasatinib  70 mg  09/20/22: C5 Maintenance: vincristine  2mg , Prednisone  200mg  D1-5, dasatinib  70 mg  10/04/22: Bone marrow biopsy - continued remission, MRD and BCR/ABL p190 negative  10/18/22: C6 Maintenance. ANC 0.4, HOLD dasatinib , continue vincristine  2mg  and Prednisone  200mg  D1-5  11/15/22: C7 Maintenance ANC 0.4. HOLD TKI, continue vincristine  2mg  and Prednisone  200mg  D1-5  ~11/30/22: Start ponatinib  15 mg daily  12/13/22: C8 Maintenance ANC 1.2. ponatinib  15mg , vincristine  2mg  D1 and Prednisone  200mg  D1-5  01/10/23: C9 Maintenance Anc 0.7. HOLD ponatinib , continue vincristine  2mg  D1 and Prednisone  200mg  D1-5  01/26/23: ANC 1.7, restart ponatinib  15 mg  02/08/23: C10 Maintenance. ANC is 1.4 - continue ponatinib   03/07/23: C11 Maintenance ANC is 1.1. continue ponatinib   04/06/23: C12 Maintenance  04/12/23: Bmbx - continued remission, MRD-negative, BCR/ABL undetectable  05/02/23: C13 Maintenance  05/30/23: C14 Maintenance  06/27/23: C15 Maintenance PB p210 2/100,000  07/23/23: C16 Maintenance PB p210 2/100,000  08/22/23: C17 Maintenance PB p210 4/100,000  09/19/23: C18 Maintenance PB p210 2/100,000  10/16/23: C19 Maintenance - HOLD prednisone  due to hyperglycemia  10/16/23: Bmbx - CR, MRD-neg by flow, p210 47/100,000   11/01/23: Increased ponatinib  to 30 mg daily  11/26/23: CNS relapse - IT chemo   11/30/23: IT chemo - blasts present   12/01/23: Bmbx - MRD+ disease, 0.14% by flow, p190 241/100,000   12/04/23: IT chemo - blasts present   12/07/23: IT chemo - blasts present   12/07/23: IT chemo - no definitive blasts - 1/8 clear   12/12/23: C1D1 Blinatumomab    12/11/23: IT chemo - no blasts - 2/8 clear  12/18/23: IT chemo - no blasts - 3/8 clear   12/27/23: IT chemo - no blasts - 4/8 clear   01/09/24: IT chemo - no blasts - 5/8 clear   01/15/24: Bmbx - MRD negative by flow and p190   01/24/24: IT chemo   01/25/24: Cycle 2 blinatumomab  + ponatinib  30mg  daily  02/22/24: IT chemo  03/06/24: IT chemo  03/07/24: Cycle 3 blinatumomab  + ponatinib  30 mg daily  03/07/24: PB p190 negative  04/04/24: IT chemo  04/11/24: IT chemo  04/18/24: Cycle 4 blinatumomab  + ponatinib  30 mg daily  04/18/24: PB p190 negative  05/21/24: Bmbx -  MRD-neg by clonoseq, p190 negative      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)    (CMS-HCC)   10/29/2021 Initial Diagnosis    B-cell acute lymphoblastic leukemia (ALL) (CMS-HCC)     10/30/2021 - 03/30/2022 Chemotherapy    IP/OP LEUKEMIA GRAAPH-2005 < 60 YO (OP PEGFILGRASTIM  ON DAY 7)  [No description for this plan]     01/06/2022 - 04/07/2022 Endocrine/Hormone Therapy    OP LEUPROLIDE  (LUPRON ) 11.25 MG EVERY 3 MONTHS  Plan Provider: Toribio JONELLE Bunker, MD     05/26/2022 -  Chemotherapy    OP LEUKEMIA VINCRISTINE   vinCRIStine  2 mg IV on day 1     07/26/2022 - 07/26/2022 Endocrine/Hormone Therapy    OP LEUPROLIDE  (LUPRON ) 11.25 MG EVERY 3 MONTHS  Plan Provider: Toribio JONELLE Bunker, MD         Planned Chemotherapy Regimen:     Reduced Intensity transplant from an unrelated donor (10/10 female DP permissive, fresh PBSCs)    Conditioning regimen will consist of:  1. Fludarabine 40 mg/m2 days -5, -4, -3, -2  2. Melphalan 100  mg/m2 day -1    GVHD prophylaxis will consist of:  1.Tacrolimus 0.045 mg/kg PO BID starting day -3 followed by 0.03 mg/kg PO BID starting on day -1 with a goal of 5-10 ng/mL  2. Methotrexate  5 mg/m2 IVP on days +1, +3, +6 and +11      We discussed the basic phases of transplant, including:   1. Conditioning phase: chemotherapy agents are given, leading up to day 0. Explained goals of myelosuppression and immunosuppression  2. Stem Cell Reinfusion: explained procedure and associated side effects  3. Nadir phase: counts fall, then recover. Complications during this phase include febrile neutropenia, anemia, thrombocytopenia, nausea/vomiting, mucositis, loss/lack of appetite, alopecia.   4. Post engraftment phase: recovery of counts, freedom from transfusions, strength building, risks for GVHD, monitoring for infections.     Chemotherapy Counseling:  Fludarabine 40 mg/m2 infused IV over 30 min daily for 4 days. Well tolerated, minimal nausea/vomiting. Rare neurotoxicity at higher doses    Melphalan 100 mg/m2 infused over 15-30 minutes for 1 day. Toxicities include nausea/vomiting (premedicate with fosaprepitant, ondansetron  and dexamethasone ), mucositis, diarrhea (delayed), alopecia.    GVHD medication counseling:  Tacrolimus 0.45 mg/kg starting on day -3 followed by 0.03 mg/kg starting day -1, orally, and continues for, typically, 6 months assuming no GVHD. Duration of therapy depends on donor engraftment and occurrence of GVHD. Toxicities include hypertension, K and Mg wasting, tremor, visual changes. Stressed the importance of monitoring and potential for drug interactions.    Methotrexate  given IVP on days +1, +3, +6, +11. Explained immunosuppressive effect of methotrexate , and functionality in preventing/minimizing GVHD. Toxicities include worsening of mucositis pain, prolonging time to engraftment    Antimicrobial counseling: Fluconazole  400mg  PO Qday, starts on day 0, continues through at least day+75. Prevents majority of fungal infections. Drug choice and duration of therapy may change based on development of GVHD    Valacyclovir  500mg  PO BID. Continues for 1 year to prevent HSV and VZV infections    Letermovir  480 mg po daily starting at day +15 or hospital discharge (whichever is sooner).  Continues through day +100 to prevent CMV infections    Levofloxacin  500mg  Po Qday, starts on day 0, continues through engraftment. Treatment may be changed if patient develops febrile neutropenia.    Dapsone  100 mg PO QDay. Starts at platelet engraftment, and continues until patients are off of immunosuppression  Medication reconciliation  I reviewed the patient's current medication list, and made recommendations as to the continuation, holding, or discontinuation of medications at this time. Current medications with recommendations for admission include:  Current Outpatient Medications   Medication Sig Comments     albuterol  HFA 90 mcg/actuation inhaler Inhale 2 puffs every six (6) hours as needed for wheezing. Continue PRN    aspirin  (ECOTRIN) 81 MG tablet Take 1 tablet (81 mg total) by mouth daily. STOP on admit    cetirizine  (ZYRTEC ) 10 MG tablet Take 1 tablet (10 mg total) by mouth daily. Continue     dapsone  100 MG tablet Take 1 tablet (100 mg total) by mouth daily. TAKE 1 TABLET(100 MG) BY MOUTH DAILY Continue     escitalopram  oxalate (LEXAPRO ) 10 MG tablet Take 1 tablet (10 mg total) by mouth daily. Continue and consider increasing dose to 20 mg daily if requiring ativan  consistently     losartan  (COZAAR ) 50 MG tablet Take 1 tablet (50 mg total) by mouth in the morning. Continue with plan to hold for hypotension     valACYclovir  (VALTREX ) 500 MG tablet Take 1 tablet (500 mg total) by mouth daily. Increase to twice daily on admit     amoxicillin -clavulanate (AUGMENTIN ) 875-125 mg per tablet Take 1 tablet by mouth two (2) times a day for 10 days. Started on 10/31 with plan to complete 10 day course of therapy through 11/9    blood-glucose sensor (DEXCOM G7 SENSOR) Devi 1 each by Miscellaneous route every ten (10) days. Patient will bring her own supply     insulin  aspart (NOVOLOG  FLEXPEN) 100 unit/mL (3 mL) injection pen Use up to 30 units/day, divided TID AC meals as per MD instructions Plan to use per SSI TID AC    insulin  glargine (LANTUS  SOLOSTAR U-100 INSULIN ) 100 unit/mL (3 mL) injection pen Inject 0.25 mL (25 Units total) under the skin daily. Continue     letermovir  (PREVYMIS ) 480 mg tablet Take 1 tablet (480 mg total) by mouth at bedtime. Continue     LORazepam  (ATIVAN ) 0.5 MG tablet Take 1 tablet (0.5 mg total) by mouth every eight (8) hours as needed for anxiety. Continue PRN N/V/anxiety     Recommendations:  - plan to repeat EKG on admit as last QTc was 489 on 05/23/24  - consider increasing lexapro  to 20 mg once daily if she requires PRN lorazepam  frequently for management of anxiety     I spent 50 minutes discussing the above with the patient and their family/caregivers. I would be happy to see Patricia [name] in clinic in the future as needed either pre or post transplant.    Elizebeth Has, PharmD, BCOP  Clinical Pharmacist Practitioner, BMTCT

## 2024-06-04 NOTE — Progress Notes (Signed)
 Patient has met all recipient eligibility/suitability criteria and was cleared to proceed with an allogeneic stem cell transplant by Dr. Jeaneen on 05/29/24.   Consent for procedure was signed on 05/29/24.  An anonymous, unrelated donor was deemed eligible/suitable and was cleared to proceed with stem cell donation by the [facility] Marrow Donor [facility] and Dr. Jeaneen on 05/29/24.       Family Conference was performed with caregiver/patient present.  The following information was reviewed:     Conditioning Regimen Overview was provided by Dr. Jamieson where the chemotherapy  treatments used in the protocol were discussed.  Potential side effects, therapy related toxicities, risks and benefits were included.  Alternatives to transplant therapy were discussed.  A consent for bone marrow transplant was reviewed and signed.   Applicable data submission consent forms were reviewed and signed.       Additional topics reviewed and discussed included Advance Directives, Pain Management, Isolation Procedures, Visitors Guidelines, Room Assignments, BMT Unit policies, Role of the Caregiver, Housing Arrangements, Arts Administrator offered.     Patient/caregiver signed the [facility]row [facility]em Cell Transplant Program's Patient Care Conference form which concur that the above issues were discussed, that they have had the opportunity to ask questions and that their questions were answered to their satisfaction.     Reviewed patient calendar/itinerary.  Addressed all questions/concerns.      Total Time Spent: 90 mins  Time spent in direct patient care: 

## 2024-06-04 NOTE — Progress Notes (Signed)
 Bone Marrow Transplant and Cellular Therapy Program Pre-admission H&P    [name]  MRN: [id number]  Encounter Date: 06/05/24    Referring Physician: Dr. Toribio Bunker  BMT Attending MD: Dr. 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 She was initially diagnosed in March 2023, she started treatment on the [facility] protocol (ponatinib  + maintenance (vincristine , prednisone  and Rituximab ) Completed 5 cycles then transitioned to maintenance.   Course was complicated by multiple hospitalizations for infections.   She unfortunately developed CNS relapse while on maintenance and also with MRD + marrow so was started on Blinatumomab .   She has completed cycle #4 of Blinatumomab  + Ponatinib  ended on 06/03/24. She received multiple IT treatments, last on 05/24/24.  She is MRD negative, undetectable BCR/ALB (p190), clonoSeq immunoglobulin sequencing negative, CR2.     Interval History:  Patricia [name] now presents for a matched unrelated donor stem cell transplant.   Tolerated her consolidation generally well.   Fatigue with chemo and had count issues prior to changing to ponatinib , but since then has done well.   She has had infections with recurrent sinusitis and CMV viremia. Most recently she has been on Letermovir  with negative CMV PCR not detected on 05/09/24. She has been receiving monthly IVIG.   She has been experiencing intermittent vomiting without any nausea prn, usually occurs when having a BM over the past few weeks  Anxiety increased as she gets to transplant, she continues on Lexapro  and has a local therapist she can have virtual visit with as needed.   For her anxiety she has requested Ativan  prn. Can consider clonazepam while admitted and increasing lexapro  if anxiety is still increased during her hospital stay.  Uses Tramadol  for pain, oxy causes Migraines.  Was taking Baby ASA with Ponatinib  to prevent clots, but will stop this on admission.   Currently on Augmentin  for sinus infection (post nasal drip, facial pain and neck pain were her symptoms which are now all improved), started Augmentin  last week on 10/31, has a 10 day course will plan to complete course. She was not swabbed at time of starting abx last week, so will send RPP today but sx are improving.    Also on allergy medications, has issues in the Spring and Fall with sinus infections  She has a Dexcom monitor, she would like to wear this in the hospital. She understands will continue finger sticks in the hospital for BG management.   Currently she is using Novolog  SSI with carb counting, roughly 1 unit per 8 grams of carbs and take Lantus  25 units in the morning.   Other than her improving sinus symptoms she has no other infectious symptoms . Denies any fevers or chills.     Hematology/Oncology History Overview Note   Referring/Local Oncologist:    Diagnosis:   10/29/2021  Bone marrow, left iliac, aspiration and biopsy  -  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 The majority of  the abnormal cells (64/88) contained an additional BCR/ABL1 fusion signal, while 9/88 abnormal cells contained an additional ABL1 and ASS1 signal.       Genetics:   Karyotype/FISH:   RESULTS   Date Value Ref Range Status   10/28/2021   Final    NOTE: This report reflects a combined study from a peripheral blood and a bone marrow core biopsy. Eleven cells from the peripheral blood and nine cells from the bone marrow core biopsy were analyzed. The BCR/ABL1 FISH analysis was performed on the peripheral blood.     Abnormal Karyotype:  47,XX,t(9;22)(q34;q11.2),+der(22)t(9;22)[18]/46,XX[2]    Abnormal FISH:  A BCR/ABL1 interphase FISH assay showed a signal pattern consistent with a BCR::ABL1 rearrangement and the 9;22 translocation in 88% of the 100 cells scored. The majority of the abnormal cells (64/88) contained an additional BCR/ABL1 fusion signal, while 9/88 abnormal cells contained an additional ABL1 and ASS1 signal.           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  MRD by flow insufficient, p190 2/100,000  12/10/2021: Cycle 2 B cycle + rituximab   12/14/21: IT#4  12/17/21: IT#5  01/10/22: Cycle 3 A cycle + rituximab   01/11/22: IT #6  01/24/22: Bmbx - MRD-neg by PCR (p190)   02/17/22: C4 B cycle + ritux  02/18/22: IT#7  03/21/22: C5 A cycle + ritux (4th dose)  03/22/22: IT#8  04/29/22: Bmbx - MRD-neg by PCR (p190)   05/18/22: restart dasatinib  70mg  s/p neutropenia  05/26/22: Start maintenance vincristine  2mg , Prednisone  200mg  D1-5   06/02/22: IT #9  06/27/22: C2 Maintenance: vincristine  2mg , Prednisone  200mg  D1-5, HOLD dasatinib  due to neutropenia  07/04/22: RESTART dasatinib  70mg   07/21/22: IT #10  07/26/22: C3 Maintenance: vincristine  2mg , Prednisone  200mg  D1-5, dasatinib  70 mg  08/08/22: IT #11 - prophylaxis completed  08/23/22: C4 Maintenance: vincristine  2mg , Prednisone  200mg  D1-5, dasatinib  70 mg  09/20/22: C5 Maintenance: vincristine  2mg , Prednisone  200mg  D1-5, dasatinib  70 mg  10/04/22: Bone marrow biopsy - continued remission, MRD and BCR/ABL p190 negative  10/18/22: C6 Maintenance. ANC 0.4, HOLD dasatinib , continue vincristine  2mg  and Prednisone  200mg  D1-5  11/15/22: C7 Maintenance ANC 0.4. HOLD TKI, continue vincristine  2mg  and Prednisone  200mg  D1-5  ~11/30/22: Start ponatinib  15 mg daily  12/13/22: C8 Maintenance ANC 1.2. ponatinib  15mg , vincristine  2mg  D1 and Prednisone  200mg  D1-5  01/10/23: C9 Maintenance Anc 0.7. HOLD ponatinib , continue vincristine  2mg  D1 and Prednisone  200mg  D1-5  01/26/23: ANC 1.7, restart ponatinib  15 mg  02/08/23: C10 Maintenance. ANC is 1.4 - continue ponatinib   03/07/23: C11 Maintenance ANC is 1.1. continue ponatinib   04/06/23: C12 Maintenance  04/12/23: Bmbx - continued remission, MRD-negative, BCR/ABL undetectable  05/02/23: C13 Maintenance  05/30/23: C14 Maintenance  06/27/23: C15 Maintenance PB p210 2/100,000  07/23/23: C16 Maintenance PB p210 2/100,000  08/22/23: C17 Maintenance PB p210 4/100,000  09/19/23: C18 Maintenance PB p210 2/100,000  10/16/23: C19 Maintenance - HOLD prednisone  due to hyperglycemia  10/16/23: Bmbx - CR, MRD-neg by flow, p210 47/100,000   11/01/23: Increased ponatinib  to 30 mg daily  11/26/23: CNS relapse - IT chemo   11/30/23: IT chemo - blasts present   12/01/23: Bmbx - MRD+ disease, 0.14% by  flow, p190 241/100,000   12/04/23: IT chemo - blasts present   12/07/23: IT chemo - blasts present   12/07/23: IT chemo - no definitive blasts - 1/8 clear   12/12/23: C1D1 Blinatumomab    12/11/23: IT chemo - no blasts - 2/8 clear  12/18/23: IT chemo - no blasts - 3/8 clear   12/27/23: IT chemo - no blasts - 4/8 clear   01/09/24: IT chemo - no blasts - 5/8 clear   01/15/24: Bmbx - MRD negative by flow and p190   01/24/24: IT chemo   01/25/24: Cycle 2 blinatumomab  + ponatinib  30mg  daily  02/22/24: IT chemo  03/06/24: IT chemo  03/07/24: Cycle 3 blinatumomab  + ponatinib  30 mg daily  03/07/24: PB p190 negative  04/04/24: IT chemo  04/11/24: IT chemo  04/18/24: Cycle 4 blinatumomab  + ponatinib  30 mg daily  04/18/24: PB p190 negative  05/21/24: Bmbx - MRD-neg by clonoseq, p190 negative      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)    (CMS-HCC) 10/29/2021 Initial Diagnosis    B-cell acute lymphoblastic leukemia (ALL) (CMS-HCC)     10/30/2021 - 03/30/2022 Chemotherapy    IP/OP LEUKEMIA GRAAPH-2005 < 60 YO (OP PEGFILGRASTIM  ON DAY 7)  [No description for this plan]     01/06/2022 - 04/07/2022 Endocrine/Hormone Therapy    OP LEUPROLIDE  (LUPRON ) 11.25 MG EVERY 3 MONTHS  Plan Provider: Toribio JONELLE Bunker, MD     05/26/2022 -  Chemotherapy    OP LEUKEMIA VINCRISTINE   vinCRIStine  2 mg IV on day 1     07/26/2022 - 07/26/2022 Endocrine/Hormone Therapy    OP LEUPROLIDE  (LUPRON ) 11.25 MG EVERY 3 MONTHS  Plan Provider: Toribio JONELLE Bunker, MD       Past Medical History[1]  Past Surgical History[2]  Social History     Substance and Sexual Activity   Alcohol Use Not Currently    Tobacco Use History[3]   Social History     Substance and Sexual Activity   Drug Use Not Currently     [phone] (home)   Family History[4]      Allergies[5]  Current Medications[6]    Review of Systems:  Constitutional: Denies fever, chills, sweats, unexplained weight loss   Eyes: Denies vision changes, eye pain, eye redness   ENT: Denies headaches, sore throat, hoarseness, sneezing, vertigo, hearing loss, tinnitus, neck pain, stiffness, dizziness, tooth pain, gum pain, mouth ulcers (+resolving sinus symptoms)  Skin: Denies rashes, sores, jaundice, itching, dryness   Cardiovascular: Denies chest pain, shortness of breath (at rest or with exertion), edema   Pulmonary: Denies chest congestion, cough, SOB  Endocrine: Denies polyuria, polydipsia, heat/cold intolerance   Gastrointestinal/Abdomen: Denies heartburn, early satiety, nausea, vomiting, abdominal pain, changes in bowel habits, blood per rectum  Genito-urinary: Denies frequency, urgency, dysuria, hematuria, nocturia   Musculoskeletal/Extremities: Denies myalgias, arthralgias, joint swelling, joint pain   Neurologic:  Denies numbness, tingling, weakness, changes in gait, confusion, slurred speech   Psychology:  + Anxiety Denies change in mood, insomnia   Heme/Lymph: As per HPI/PMH      Objective:  Temp:  [36.9 ??C (98.5 ??F)] 36.9 ??C (98.5 ??F)  Pulse:  [80] 80  BP: (138)/(95) 138/95   Vitals:    06/05/24 1048   Weight: 79.7 kg (175 lb 11.3 oz)      KPS: 90,  Able to carry on normal activity; minor signs or symptoms of disease (ECOG equivalent 0)    Physical Exam:  General : No acute distress noted.  Central venous access: Has chest port on the right. Line clean, dry, intact. No erythema or drainage noted.   ENT: Moist mucous membranes. Oropharhynx without lesions, erythema or exudate.   Cardiovascular: Pulse normal rate, regularity and rhythm. S1 and S2 normal, without any murmur, rub, or gallop.  Lungs: Clear to auscultation bilaterally, without wheezes/crackles/rhonchi. Good air movement.   Skin: Warm, dry, intact. No rash noted.   Psychiatry: Alert and oriented to person, place, and time.   GI : Normoactive bowel sounds, abdomen soft, non-tender   Extremeties: No edema.   Musculo Skeletal: Full range of motion in shoulder, elbow, hip knee, ankle, left hand and feet.  Neurologic: CNII-XII intact. Normal strength and sensation throughout    Test Results:   WBC   Date Value Ref Range Status   06/05/2024 3.9 3.6 - 11.2 10*9/L Preliminary     HGB   Date Value Ref Range Status   06/05/2024 12.3 11.3 - 14.9 g/dL Preliminary     HCT   Date Value Ref Range Status   06/05/2024 34.8 34.0 - 44.0 % Preliminary     Platelet   Date Value Ref Range Status   06/05/2024 117 (L) 150 - 450 10*9/L Preliminary     Comment:     Platelet Count greater than or equal to Preliminary Result, Confirmation to follow.       Absolute Neutrophils   Date Value Ref Range Status   05/29/2024 4.8 1.8 - 7.8 10*9/L Final     Absolute Eosinophils   Date Value Ref Range Status   05/29/2024 0.1 0.0 - 0.5 10*9/L Final     Sodium   Date Value Ref Range Status   06/05/2024 146 (H) 135 - 145 mmol/L Final     Potassium   Date Value Ref Range Status   06/05/2024 3.2 (L) 3.4 - 4.8 mmol/L Final Chloride   Date Value Ref Range Status   06/05/2024 107 98 - 107 mmol/L Final     CO2   Date Value Ref Range Status   06/05/2024 25.0 20.0 - 31.0 mmol/L Final     BUN   Date Value Ref Range Status   06/05/2024 9 9 - 23 mg/dL Final     Creatinine   Date Value Ref Range Status   06/05/2024 0.58 0.55 - 1.02 mg/dL Final     Glucose   Date Value Ref Range Status   06/05/2024 146 70 - 179 mg/dL Final     Calcium    Date Value Ref Range Status   06/05/2024 9.2 8.7 - 10.4 mg/dL Final     Magnesium    Date Value Ref Range Status   05/23/2024 1.7 1.6 - 2.6 mg/dL Final     Total Bilirubin   Date Value Ref Range Status   06/05/2024 0.3 0.3 - 1.2 mg/dL Final     Total Protein   Date Value Ref Range Status   06/05/2024 7.1 5.7 - 8.2 g/dL Final     Albumin   Date Value Ref Range Status   06/05/2024 4.0 3.4 - 5.0 g/dL Final     ALT   Date Value Ref Range Status   06/05/2024 20 10 - 49 U/L Final     AST   Date Value Ref Range Status   06/05/2024 23 <=34 U/L Final     Alkaline Phosphatase   Date Value Ref Range Status   06/05/2024 75 46 - 116 U/L Final     LDH   Date Value  Ref Range Status   05/23/2024 301 (H) 120 - 246 U/L Final        DONOR STUDIES:  Type of stem cells: unrelated female  Blood Type: O-  CMV Status: negative  Type of match: 10/10    PATIENT PRE-TRANSPLANT STUDIES:   Blood Type: O+  CXR: No airspace disease  PFTS: FVC: 92%  FEV1: 85% DLCO ([place]): 73%  ECHO: EF: 55-60% on 05/23/24, improved from 45-50% on 04/25/24  EKG: NSR, prolonged QTcf 489    Bone marrow biopsy: 05/21/24  Bone marrow, left iliac, aspiration and biopsy  -  Normocellular bone marrow (60%) with trilineage hematopoiesis and 3% blasts by manual aspirate differential  -  Flow cytometric MRD analysis reveals no definitive immunophenotypic evidence of residual B-lymphoblastic leukemia/lymphoma   - BCR ABL p190 not detected  - clonoSeq immunoglobulin sequencing negative      Hep B S Ab   Date Value Ref Range Status   11/25/2023 Reactive (A) Nonreactive, [place] Final     Hep B Core Total Ab   Date Value Ref Range Status   05/23/2024 Reactive (A) Nonreactive Final     Hepatitis C Ab   Date Value Ref Range Status   05/23/2024 Nonreactive Nonreactive Final     Comment:     Antibodies to HCV were not detected.  A nonreactive result does not exclude the possibility of exposure to HCV.     CMV IGG   Date Value Ref Range Status   05/29/2024 Positive (A) Negative Final     RPR   Date Value Ref Range Status   05/23/2024 Nonreactive Nonreactive Final     EBV VCA IgG Antibody   Date Value Ref Range Status   05/23/2024 Positive (A) Negative Final     EBV VCA IgM Antibody   Date Value Ref Range Status   05/23/2024 Negative Negative Final     EBV Nuclear Ag IgG Antibody   Date Value Ref Range Status   05/23/2024 Positive (A) Negative Final     VRE Screen   Date Value Ref Range Status   05/23/2024 NOT DETECTED  Final     HSV 1 IgG   Date Value Ref Range Status   05/23/2024 Positive (A) Negative Final     HSV 2 IgG   Date Value Ref Range Status   05/23/2024 Positive (A) Negative Final     Varicella IgG   Date Value Ref Range Status   05/23/2024 Positive  Final     VZV IgG s/co   Date Value Ref Range Status   05/23/2024 14.7  Final     Toxoplasma Gondii IgG   Date Value Ref Range Status   05/23/2024 Negative Negative Final      Pregnancy test: negative    Assessment/Plan:    BMT: ALL   CR MRD Negative  HCT-CI (age adjusted) 30 (age, anxiety, reduced DLCO, HF (reduced EF), T2DM, CMV viremia)  Conditioning:.  1. Fludarabine 40 mg/m2 days -5, -4, -3, -2  2. Melphalan 100  mg/m2 day -1     Donor: 10/10, Female, ABO O-, CMV negative  Engraftment: G-CSF starting Day + 5 through WBC recovery (as defined as ANC 1.0 x 2 days or 3.0 x 1 day)  -Date of last G-CSF injection: TBD    GVHD prophylaxis:   1.Tacrolimus starting on day -3 (goal 5-10 ng/mL)  2. Methotrexate  5 mg/m2 IVP on days +1, +3, +6 and +11  3.  ATG per [facility] standard  dosing will not be included    Hem: Transfusion criteria: Transfuse 1 unit of PRBCs for hemoglobin <7 and 1 unit of platelets for Plt <10K or bleeding. [name] [name] does not have a history of transfusion reactions. Consent was obtained and documented on 06/05/24.    ID:   Prophylaxis:  -Antiviral: Valtrex  500 mg po BID to start day 0 and continue for 2 year  Letermovir  indicated as R/CMV + D/CMV - and with prior hx of CMV viremia, plan to continue through at least day +200  -Antifungal: Fluconazole  400 mg po daily to start day 0 thorugh day +75  -Antibacterial: Levaquin  500 mg po daily to start day 0, continue while neutropenic  -PJP: Sulfa allergic. Dapsone  daily upon platelet engraftment, has tolerated this well in the past. Toxo PCR negative.     Patient was screened for upper respiratory symptoms, including S/S of Covid-19. bmt prescreen symptoms: Patient reports upper respiratory symptoms.  bmtu rpp testing: RPP to be ordered and performed ahead of BMTU admission. Patient educated on continued need to inform medical team of any new symptoms.    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   Previously required Valcyte  treatment  -Continue Letermovir  and Valtrex  through day +200 post SCT.     Recurrent acute sinusitis   Recurrent acute sinusitis with nasal congestion, frontal headache, and low-grade fever. Responded well to Augmentin .  Current sx resolving, started Augmentin  10 days on 10/30    Hypogammaglobulinemia  Monthly IVIG - last on 05/16/2024    GI:   GERD prophylaxis:  -Pepcid  BID    Nausea:  -Anti-emetics per BMT protocol.     Diarrhea:  -Antidiarrheals per BMT protocol    Renal: Creatinine normal. No issues.     FEN:    Electrolyte replacement per protocol    Hepatic: Normal bilirubin. SOS prophylaxis not indicated with RIC regimen.     CV: Followed by [facility] Cardiology: Heart failure with mildly reduced ejection fraction (HFmrEF)   Hx of EF 45-50%, most recent Echo 05/23/24 improved to 55-60%  Hx of prolonged QTc, last EKG 489 follow closely with addition of QTc prolonging medications    Hypertension: Home med Losartan  50 mg daily.     Pulm: No pulmonary symptoms currently. Reduced DLCO     Endocrine:   Menses suppression: Lupron  last 07/26/22.  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 At home uses Dexcom for monitoring. Lantus  25 units AM,TID Novolog  10-15 units with meals if BG >150    Neuro/Pain: No issues, prefers Tramadol  for pain     Psych: Followed by Palliative Care    Anxiety: Home med: Lexapro  10 mg daily   Reconnected with her local therapist   Continues to endorse anxiety, we can increase her lexapro  if anxiety continues.   Ativan  prn, Rx provided today. Consider low dose while admitted if needed.     Caregivers: Lives in [place], [place]  Primary caregiver: Jeanie Shankar (mom)- first 30 days at [facility] then home if doing well will stay longer if medically needed  Secondary caregiver: Devaughn [name] (husband)    Consults: While inpatient, consider consults to music therapy, integrative medicine and massage therapy.     Rosina MARLA Morse, ANP  Bone Marrow Transplant and Cellular Therapy Program      I personally spent 60 minutes face-to-face and non-face-to-face in the care of this patient, which includes all pre, intra,  and post visit time on the date of service.             [1]   Past Medical History:  Diagnosis Date    Anxiety     B-cell acute lymphoblastic leukemia (ALL)    (CMS-HCC) 10/29/2021    Cytomegalovirus (CMV) viremia    (CMS-HCC)     Diabetes mellitus    (CMS-HCC)     Hypertension    [2]   Past Surgical History:  Procedure Laterality Date    BREAST CYST EXCISION Right     age 76    CHEMOTHERAPY      leukemia    IR INSERT PORT AGE GREATER THAN 5 YRS  12/08/2021    IR INSERT PORT AGE GREATER THAN 5 YRS 12/08/2021 Charmaine Arloa Provencal, PA IMG [facility]IR [facility]   [3]   Social History  Tobacco Use   Smoking Status Former    Current packs/day: 0.00    Types: Cigarettes    Quit date: 11/12/2015 Years since quitting: 8.5   Smokeless Tobacco Never   [4] No family history on file.  [5]   Allergies  Allergen Reactions    Other      Pt cant take ibuprofen due to condition.    Sulfa (Sulfonamide Antibiotics) Anaphylaxis     Other reaction(s): Not available    Azithromycin Hives     Per patient report    Erythromycin Hives     Other reaction(s): Not available    Clindamycin      Other Reaction(s): GI discomfort     Metoprolol Other (See Comments)     Other Reaction(s): Edema   [6]   Current Outpatient Medications   Medication Sig Dispense Refill    albuterol  HFA 90 mcg/actuation inhaler Inhale 2 puffs every six (6) hours as needed for wheezing. 8 g 1    amoxicillin -clavulanate (AUGMENTIN ) 875-125 mg per tablet Take 1 tablet by mouth two (2) times a day for 10 days. 20 tablet 0    aspirin  (ECOTRIN) 81 MG tablet Take 1 tablet (81 mg total) by mouth daily.      blood sugar diagnostic (GLUCOSE BLOOD) Strp Use to check blood sugar two times a day. 100 each 0    blood-glucose meter kit Use to check blood sugar two times a day. Use as instructed. 1 each 0    blood-glucose sensor (DEXCOM G7 SENSOR) Devi 1 each by Miscellaneous route every ten (10) days. 9 each 3    cetirizine  (ZYRTEC ) 10 MG tablet Take 1 tablet (10 mg total) by mouth nightly. 90 tablet 3    dapsone  100 MG tablet Take 1 tablet (100 mg total) by mouth daily. TAKE 1 TABLET(100 MG) BY MOUTH DAILY 30 tablet 11    escitalopram  oxalate (LEXAPRO ) 10 MG tablet Take 1 tablet (10 mg total) by mouth daily. 90 tablet 2    insulin  aspart (NOVOLOG  FLEXPEN) 100 unit/mL (3 mL) injection pen Use up to 30 units/day, divided TID AC meals as per MD instructions 30 mL 12    insulin  glargine (LANTUS  SOLOSTAR U-100 INSULIN ) 100 unit/mL (3 mL) injection pen Inject 0.22 mL (22 Units total) under the skin nightly. 3 mL 10    lancets Misc Use to check blood sugar as directed 2 times a day & for symptoms of high or low blood sugar. 100 each 0    letermovir  (PREVYMIS ) 480 mg tablet Take 1 tablet (480 mg total) by mouth daily. 28 tablet 5  LORazepam  (ATIVAN ) 0.5 MG tablet Take 1 tablet (0.5 mg total) by mouth every eight (8) hours as needed for anxiety. 6 tablet 0    losartan  (COZAAR ) 50 MG tablet Take 1 tablet (50 mg total) by mouth in the morning. 100 tablet 3    valACYclovir  (VALTREX ) 500 MG tablet Take 1 tablet (500 mg total) by mouth daily. 30 tablet 12     No current facility-administered medications for this visit.

## 2024-06-05 ENCOUNTER — Ambulatory Visit
Admit: 2024-06-05 | Discharge: 2024-06-05 | Payer: PRIVATE HEALTH INSURANCE | Attending: Pharmacist Clinician (PhC)/ Clinical Pharmacy Specialist | Primary: Pharmacist Clinician (PhC)/ Clinical Pharmacy Specialist

## 2024-06-05 ENCOUNTER — Ambulatory Visit
Admit: 2024-06-05 | Discharge: 2024-06-05 | Payer: PRIVATE HEALTH INSURANCE | Attending: Primary Care | Primary: Primary Care

## 2024-06-05 ENCOUNTER — Other Ambulatory Visit: Admit: 2024-06-05 | Discharge: 2024-06-05 | Payer: PRIVATE HEALTH INSURANCE

## 2024-06-05 ENCOUNTER — Ambulatory Visit: Admit: 2024-06-05 | Discharge: 2024-06-05 | Payer: PRIVATE HEALTH INSURANCE

## 2024-06-05 ENCOUNTER — Encounter: Admit: 2024-06-05 | Discharge: 2024-06-05 | Payer: PRIVATE HEALTH INSURANCE

## 2024-06-05 DIAGNOSIS — C9100 Acute lymphoblastic leukemia not having achieved remission: Principal | ICD-10-CM

## 2024-06-05 DIAGNOSIS — Z7682 Awaiting organ transplant status: Principal | ICD-10-CM

## 2024-06-05 DIAGNOSIS — J0111 Acute recurrent frontal sinusitis: Principal | ICD-10-CM

## 2024-06-05 DIAGNOSIS — Z8679 Personal history of other diseases of the circulatory system: Principal | ICD-10-CM

## 2024-06-05 DIAGNOSIS — Z8619 Personal history of other infectious and parasitic diseases: Principal | ICD-10-CM

## 2024-06-05 DIAGNOSIS — E119 Type 2 diabetes mellitus without complications: Principal | ICD-10-CM

## 2024-06-05 DIAGNOSIS — B259 Cytomegaloviral disease, unspecified: Principal | ICD-10-CM

## 2024-06-05 DIAGNOSIS — F419 Anxiety disorder, unspecified: Principal | ICD-10-CM

## 2024-06-05 DIAGNOSIS — D801 Nonfamilial hypogammaglobulinemia: Principal | ICD-10-CM

## 2024-06-05 DIAGNOSIS — Z794 Long term (current) use of insulin: Principal | ICD-10-CM

## 2024-06-05 DIAGNOSIS — D849 Immunodeficiency, unspecified: Principal | ICD-10-CM

## 2024-06-05 DIAGNOSIS — F32A Anxiety and depression: Principal | ICD-10-CM

## 2024-06-05 LAB — COMPREHENSIVE METABOLIC PANEL
ALBUMIN: 4 g/dL (ref 3.4–5.0)
ALKALINE PHOSPHATASE: 75 U/L (ref 46–116)
ALT (SGPT): 20 U/L (ref 10–49)
ANION GAP: 14 mmol/L (ref 5–14)
AST (SGOT): 23 U/L (ref <=34)
BILIRUBIN TOTAL: 0.3 mg/dL (ref 0.3–1.2)
BLOOD UREA NITROGEN: 9 mg/dL (ref 9–23)
BUN / CREAT RATIO: 16
CALCIUM: 9.2 mg/dL (ref 8.7–10.4)
CHLORIDE: 107 mmol/L (ref 98–107)
CO2: 25 mmol/L (ref 20.0–31.0)
CREATININE: 0.58 mg/dL (ref 0.55–1.02)
EGFR CKD-EPI (2021) FEMALE: >90 mL/min/1.73m2 (ref >=60)
GLUCOSE RANDOM: 146 mg/dL (ref 70–179)
POTASSIUM: 3.2 mmol/L — ABNORMAL LOW (ref 3.4–4.8)
PROTEIN TOTAL: 7.1 g/dL (ref 5.7–8.2)
SODIUM: 146 mmol/L — ABNORMAL HIGH (ref 135–145)

## 2024-06-05 LAB — CBC W/ AUTO DIFF
BASOPHILS ABSOLUTE COUNT: 0 10*9/L (ref 0.0–0.1)
BASOPHILS RELATIVE PERCENT: 0.7 %
EOSINOPHILS ABSOLUTE COUNT: 0.1 10*9/L (ref 0.0–0.5)
EOSINOPHILS RELATIVE PERCENT: 2.2 %
HEMATOCRIT: 34.8 % (ref 34.0–44.0)
HEMOGLOBIN: 12.3 g/dL (ref 11.3–14.9)
LYMPHOCYTES ABSOLUTE COUNT: 1.1 10*9/L (ref 1.1–3.6)
LYMPHOCYTES RELATIVE PERCENT: 28.8 %
MEAN CORPUSCULAR HEMOGLOBIN CONC: 35.2 g/dL (ref 32.0–36.0)
MEAN CORPUSCULAR HEMOGLOBIN: 32.7 pg — ABNORMAL HIGH (ref 25.9–32.4)
MEAN CORPUSCULAR VOLUME: 93 fL (ref 77.6–95.7)
MEAN PLATELET VOLUME: 11.2 fL — ABNORMAL HIGH (ref 6.8–10.7)
MONOCYTES ABSOLUTE COUNT: 0.4 10*9/L (ref 0.3–0.8)
MONOCYTES RELATIVE PERCENT: 10 %
NEUTROPHILS ABSOLUTE COUNT: 2.3 10*9/L (ref 1.8–7.8)
NEUTROPHILS RELATIVE PERCENT: 58.3 %
PLATELET COUNT: 117 10*9/L — ABNORMAL LOW (ref 150–450)
RED BLOOD CELL COUNT: 3.74 10*12/L — ABNORMAL LOW (ref 3.95–5.13)
RED CELL DISTRIBUTION WIDTH: 15 % (ref 12.2–15.2)
WBC ADJUSTED: 3.9 10*9/L (ref 3.6–11.2)

## 2024-06-05 LAB — SLIDE REVIEW

## 2024-06-05 MED ORDER — LETERMOVIR 480 MG TABLET
ORAL_TABLET | Freq: Every evening | ORAL | 5 refills | 28.00000 days | Status: CP
Start: 2024-06-05 — End: ?
  Filled 2024-07-18: qty 28, 28d supply, fill #0

## 2024-06-05 MED ORDER — LORAZEPAM 0.5 MG TABLET
ORAL_TABLET | Freq: Three times a day (TID) | ORAL | 0 refills | 2.00000 days | Status: CP | PRN
Start: 2024-06-05 — End: 2025-06-05
  Filled 2024-06-05: qty 6, 2d supply, fill #0

## 2024-06-05 NOTE — Telephone Encounter (Signed)
 SELECTED BMT DONOR    Stem Cell Source:Peripheral blood  Fresh or Cryopreserved:  Planned Fresh  Donor Sex:  Female  Donor Type: Unrelated  HLA Match Grade:10/10  DP Match: Permissive  HLA Mismatch @: N/A      Donor ABOBETHA MALVA Barb  Donor Medical Number: [id number]         Related Donor Name: n/a  Donor NMDP GRID:  [id number]   CMV Donor Status  Neg   Donor Weight (kg): 84kg

## 2024-06-05 NOTE — Progress Notes (Signed)
 Patient swabbed for a respiratory panel including COVID 19 per protocol. Pt tolerated well, no bleeding from nares. Specimen sent to core lab. Pt education attached to chart

## 2024-06-05 NOTE — Progress Notes (Signed)
 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 HTN, T2DM (steroid induced)      Ht: 169 cm (5'6)  Wt: 79.7 kg (175lb)  BMI:27.91 kg//m2  Ideal Body Weight: 130lb (hamwi)      Pt reports a good appetite, having to learn carb counting to control blood sugars. Pt is not a breakfast person. Pt usually eats x2 meals per day. Pt had some unintended weight loss but is likely related to steroids. Pt also has modified eating habits to control blood sugar.    Intervention:  1.  Nutrition education provided on Autologous/Allogeneic SCT--Food Safety.  [facility] Food Safety for Transplant Recipients handout referenced in education--pt has a copy in his BMT patient information binder.     Pt's questions and comments indicated good understanding.   =======================================================  Reminded pt to call if any questions arise.  Will follow up as needed.    Patient evaluated per standard practice in collaboration with the multidisciplinary team in BMT clinic.      Time spent 16 minutes   I am located on-site and the patient is located on-site for this visit.

## 2024-06-05 NOTE — Telephone Encounter (Signed)
 LVM with arrival time for procedure and call back number. MyChart message sent with instructions.

## 2024-06-05 NOTE — Progress Notes (Signed)
 Patricia [name]  [id number]  [birth year]    I have advised Patricia [name] of his/her medical condition, and that the chances for his/her improvement or recovery may be improved by receiving blood products by transfusion: Such as packed red blood cells, fresh frozen plasma, platelets or cryoprecipitate (blood products).    I have explained the benefits that are expected from the patient being transfused and, as well, the risk. The patient understands that although the blood products to be administered had been prepared and tested in accordance with strict scientific rules established by the [employer], there is still a very small - approximately 1 and 70,000 for Hemolytic Reactions and approximately 1 and 190,000 for TRALI - chance of blood products could be incompatible with the patient's body and a transfusion reaction can occur. Although transfusion reactions can be treated successfully, the patient understands that on rare occasions they can be fatal (1 in 250,000 transfusions). The patient also understands that allergic reactions to blood products with hives, itching, and fever are more common but can be treated and may not even require the transfusion to be stopped. The patient understands that even with testing by the most up-to-date methods, there is a small chance that the blood products may contain a virus that may not be recognized as an infection for many months or years. Even with proper testing, I discussed with the patient that the chance for contracting viral Hepatitis B is approximately 1 in 200,000, Hepatitis C is approximately 1 in 2 million, and HIV is approximately 1 in 2 million.    I gave the patient an opportunity to ask questions regarding the transfusion of blood products and I answered those questions to the patient's satisfaction.    ______________________________________________________________________  The paper consent form will subsequently be placed in the patient's physical chart (later to be scanned) as further evidence that the patient has given consent to the administration of blood products.

## 2024-06-06 DIAGNOSIS — C9100 Acute lymphoblastic leukemia not having achieved remission: Principal | ICD-10-CM

## 2024-06-06 NOTE — H&P (Signed)
 Bone Marrow Transplant and Cellular Therapy Program Admission H&P    [name]  MRN: [id number]  Encounter Date: 06/07/2024    Referring Physician: Dr. Toribio Bunker  BMT Attending MD: Dr. 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 She was initially diagnosed in March 2023, she started treatment on the [facility] protocol (ponatinib  + maintenance (vincristine , prednisone  and Rituximab ) Completed 5 cycles then transitioned to maintenance.   Course was complicated by multiple hospitalizations for infections.   She unfortunately developed CNS relapse while on maintenance and also with MRD + marrow so was started on Blinatumomab .   She has completed cycle #4 of Blinatumomab  + Ponatinib  ended on 06/03/24. She received multiple IT treatments, last on 05/24/24.  She is MRD negative, undetectable BCR/ALB (p190), clonoSeq immunoglobulin sequencing negative, CR2.     Interval History:    - Patricia [name] presents for her MUD allo SCT.   - Overall feeling fine. Reports pain to L chest where central line was inserted. Added PRN tramadol  as oxycodone  gives pt terrible migraines.   - Recently experienced sinusitis, currently on Augmentin  BID 10-day course which will end 11/9. Reports sx are improving. Denies any new infectious sx. Educated on CHG wipes.   - Reports 2 episodes of emesis over the last 2 weeks which occurred spontaneously when having bowel movements. Also reported 1 episode loose BM in last 2 weeks. Educated on c.diff protocol.   - Has had hx of recurrent infections with CMV and most recently sinusitis, and has been getting monthly IVIG infusions (last on 05/16/24). Will check weekly IgG levels.   - Has a hx of prolonged QTc with EKG from 10/23 showing . EKG from today 457 mms. Added cardiac lyte repletion and monitor EKGs weekly- next one scheduled 11/14.   - Has intermittent anxiety, controlled on Lexapro  10 mg and PRN ativan  0.5 mg.  - She has a Dexcom monitor, she would like to wear this in the hospital because she has BG lows during the night. She understands will continue finger sticks in the hospital for BG management. At home she has been on Novolog  SSI with carb counting, roughly 1 unit per 8 grams of carbs and take Lantus  25 units in the morning. Will start pt on ACHS with lispro SSI and Lantus  25 units in the morning.     Hematology/Oncology History Overview Note   Referring/Local Oncologist:    Diagnosis:   10/29/2021  Bone marrow, left iliac, aspiration and biopsy  -  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 The majority of the abnormal cells (64/88) contained an additional BCR/ABL1 fusion signal, while 9/88 abnormal cells contained an additional ABL1 and ASS1 signal.       Genetics:   Karyotype/FISH:   RESULTS   Date Value Ref Range Status   10/28/2021   Final    NOTE: This report reflects a combined study from a peripheral blood and a bone marrow core biopsy.  Eleven cells from the peripheral blood and nine cells from the bone marrow core biopsy were analyzed. The BCR/ABL1 FISH analysis was performed on the peripheral blood.     Abnormal Karyotype:  47,XX,t(9;22)(q34;q11.2),+der(22)t(9;22)[18]/46,XX[2]    Abnormal FISH:  A BCR/ABL1 interphase FISH assay showed a signal pattern consistent with a BCR::ABL1 rearrangement and the 9;22 translocation in 88% of the 100 cells scored. The majority of the abnormal cells (64/88) contained an additional BCR/ABL1 fusion signal, while 9/88 abnormal cells contained an additional ABL1 and ASS1 signal.           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  MRD by flow insufficient, p190 2/100,000  12/10/2021: Cycle 2 B cycle + rituximab   12/14/21: IT#4  12/17/21: IT#5  01/10/22: Cycle 3 A cycle + rituximab   01/11/22: IT #6  01/24/22: Bmbx - MRD-neg by PCR (p190)   02/17/22: C4 B cycle + ritux  02/18/22: IT#7  03/21/22: C5 A cycle + ritux (4th dose)  03/22/22: IT#8  04/29/22: Bmbx - MRD-neg by PCR (p190)   05/18/22: restart dasatinib  70mg  s/p neutropenia  05/26/22: Start maintenance vincristine  2mg , Prednisone  200mg  D1-5   06/02/22: IT #9  06/27/22: C2 Maintenance: vincristine  2mg , Prednisone  200mg  D1-5, HOLD dasatinib  due to neutropenia  07/04/22: RESTART dasatinib  70mg   07/21/22: IT #10  07/26/22: C3 Maintenance: vincristine  2mg , Prednisone  200mg  D1-5, dasatinib  70 mg  08/08/22: IT #11 - prophylaxis completed  08/23/22: C4 Maintenance: vincristine  2mg , Prednisone  200mg  D1-5, dasatinib  70 mg  09/20/22: C5 Maintenance: vincristine  2mg , Prednisone  200mg  D1-5, dasatinib  70 mg  10/04/22: Bone marrow biopsy - continued remission, MRD and BCR/ABL p190 negative  10/18/22: C6 Maintenance. ANC 0.4, HOLD dasatinib , continue vincristine  2mg  and Prednisone  200mg  D1-5  11/15/22: C7 Maintenance ANC 0.4. HOLD TKI, continue vincristine  2mg  and Prednisone  200mg  D1-5  ~11/30/22: Start ponatinib  15 mg daily  12/13/22: C8 Maintenance ANC 1.2. ponatinib  15mg , vincristine  2mg  D1 and Prednisone  200mg  D1-5  01/10/23: C9 Maintenance Anc 0.7. HOLD ponatinib , continue vincristine  2mg  D1 and Prednisone  200mg  D1-5  01/26/23: ANC 1.7, restart ponatinib  15 mg  02/08/23: C10 Maintenance. ANC is 1.4 - continue ponatinib   03/07/23: C11 Maintenance ANC is 1.1. continue ponatinib   04/06/23: C12 Maintenance  04/12/23: Bmbx - continued remission, MRD-negative, BCR/ABL undetectable  05/02/23: C13 Maintenance  05/30/23: C14 Maintenance  06/27/23: C15 Maintenance PB p210 2/100,000  07/23/23: C16 Maintenance PB p210 2/100,000  08/22/23: C17 Maintenance PB p210 4/100,000  09/19/23: C18 Maintenance PB p210 2/100,000  10/16/23: C19 Maintenance - HOLD prednisone  due to hyperglycemia  10/16/23: Bmbx - CR, MRD-neg by flow, p210 47/100,000   11/01/23: Increased ponatinib  to 30 mg daily  11/26/23: CNS relapse - IT chemo   11/30/23: IT chemo - blasts present   12/01/23: Bmbx - MRD+ disease, 0.14% by flow, p190 241/100,000   12/04/23: IT chemo - blasts present   12/07/23: IT chemo - blasts present   12/07/23: IT chemo - no definitive blasts - 1/8 clear   12/12/23: C1D1 Blinatumomab    12/11/23: IT chemo - no blasts - 2/8 clear  12/18/23: IT chemo - no blasts - 3/8 clear   12/27/23: IT chemo - no blasts - 4/8  clear   01/09/24: IT chemo - no blasts - 5/8 clear   01/15/24: Bmbx - MRD negative by flow and p190   01/24/24: IT chemo   01/25/24: Cycle 2 blinatumomab  + ponatinib  30mg  daily  02/22/24: IT chemo  03/06/24: IT chemo  03/07/24: Cycle 3 blinatumomab  + ponatinib  30 mg daily  03/07/24: PB p190 negative  04/04/24: IT chemo  04/11/24: IT chemo  04/18/24: Cycle 4 blinatumomab  + ponatinib  30 mg daily  04/18/24: PB p190 negative  05/21/24: Bmbx - MRD-neg by clonoseq, p190 negative      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)    (CMS-HCC)   10/29/2021 Initial Diagnosis    B-cell acute lymphoblastic leukemia (ALL) (CMS-HCC)     10/30/2021 - 03/30/2022 Chemotherapy    IP/OP LEUKEMIA GRAAPH-2005 < 60 YO (OP PEGFILGRASTIM  ON DAY 7)  [No description for this plan]     01/06/2022 - 04/07/2022 Endocrine/Hormone Therapy    OP LEUPROLIDE  (LUPRON ) 11.25 MG EVERY 3 MONTHS  Plan Provider: Toribio JONELLE Bunker, MD     05/26/2022 -  Chemotherapy    OP LEUKEMIA VINCRISTINE   vinCRIStine  2 mg IV on day 1     07/26/2022 - 07/26/2022 Endocrine/Hormone Therapy    OP LEUPROLIDE  (LUPRON ) 11.25 MG EVERY 3 MONTHS  Plan Provider: Toribio JONELLE Bunker, MD       Past Medical History[1]  Past Surgical History[2]  Social History     Substance and Sexual Activity   Alcohol Use Not Currently    Tobacco Use History[3]   Social History     Substance and Sexual Activity   Drug Use Not Currently     [phone] (home)   Family History[4]      Allergies[5]  Current Medications[6]    Review of Systems:  Constitutional: Denies fever, chills, sweats, unexplained weight loss   Eyes: Denies vision changes, eye pain, eye redness   ENT: Denies headaches, sore throat, hoarseness, sneezing, vertigo, hearing loss, tinnitus, neck pain, stiffness, dizziness, tooth pain, gum pain, mouth ulcers (+resolving sinus symptoms)  Skin: Denies rashes, sores, jaundice, itching, dryness   Cardiovascular: Denies chest pain, shortness of breath (at rest or with exertion), edema   Pulmonary: Denies chest congestion, cough, SOB  Endocrine: Denies polyuria, polydipsia, heat/cold intolerance   Gastrointestinal/Abdomen: Denies heartburn, early satiety, nausea, vomiting, abdominal pain, changes in bowel habits, blood per rectum  Genito-urinary: Denies frequency, urgency, dysuria, hematuria, nocturia   Musculoskeletal/Extremities: Denies myalgias, arthralgias, joint swelling, joint pain   Neurologic:  Denies numbness, tingling, weakness, changes in gait, confusion, slurred speech   Psychology:  + Anxiety Denies change in mood, insomnia   Heme/Lymph: As per HPI/PMH      Objective:    Vitals:    06/07/24 1103   BP: 119/90   Pulse: 96   Resp: 16   Temp: 37 ??C (98.6 ??F)   SpO2: 96%      KPS: 90,  Able to carry on normal activity; minor signs or symptoms of disease (ECOG equivalent 0)    Physical Exam:  General: No acute distress noted.   Central venous access: Has chest port on the right and triple lumen hickman to left chest. Line is clean, dry, intact. No erythema or drainage noted.   ENT: Moist mucous membranes. Oropharhynx without lesions, erythema or exudate.   Cardiovascular: Pulse normal rate, regularity and rhythm. S1 and S2 normal, without any murmur, rub, or gallop.  Lungs: Clear to auscultation bilaterally, without wheezes/crackles/rhonchi. Good air movement.  Skin: Warm, dry, intact. No rash noted.    Psychiatry: Alert and oriented to person, place, and time.   Gastrointestinal/Abdomen: Normoactive bowel sounds, abdomen soft, non-tender   Musculoskeletal/Extremities: FROM throughout. No edema  Neurologic: CNII-XII intact. Normal strength and sensation throughout      Test Results:   WBC   Date Value Ref Range Status   06/05/2024 3.9 3.6 - 11.2 10*9/L Final     HGB   Date Value Ref Range Status   06/05/2024 12.3 11.3 - 14.9 g/dL Final     HCT   Date Value Ref Range Status   06/05/2024 34.8 34.0 - 44.0 % Final     Platelet   Date Value Ref Range Status   06/05/2024 117 (L) 150 - 450 10*9/L Final     Absolute Neutrophils   Date Value Ref Range Status   06/05/2024 2.3 1.8 - 7.8 10*9/L Final     Absolute Eosinophils   Date Value Ref Range Status   06/05/2024 0.1 0.0 - 0.5 10*9/L Final     Sodium   Date Value Ref Range Status   06/05/2024 146 (H) 135 - 145 mmol/L Final     Potassium   Date Value Ref Range Status   06/05/2024 3.2 (L) 3.4 - 4.8 mmol/L Final     Chloride   Date Value Ref Range Status   06/05/2024 107 98 - 107 mmol/L Final     CO2   Date Value Ref Range Status   06/05/2024 25.0 20.0 - 31.0 mmol/L Final     BUN   Date Value Ref Range Status   06/05/2024 9 9 - 23 mg/dL Final     Creatinine   Date Value Ref Range Status   06/05/2024 0.58 0.55 - 1.02 mg/dL Final     Glucose   Date Value Ref Range Status   06/05/2024 146 70 - 179 mg/dL Final     Calcium    Date Value Ref Range Status   06/05/2024 9.2 8.7 - 10.4 mg/dL Final     Magnesium    Date Value Ref Range Status   05/23/2024 1.7 1.6 - 2.6 mg/dL Final     Total Bilirubin Date Value Ref Range Status   06/05/2024 0.3 0.3 - 1.2 mg/dL Final     Total Protein   Date Value Ref Range Status   06/05/2024 7.1 5.7 - 8.2 g/dL Final     Albumin   Date Value Ref Range Status   06/05/2024 4.0 3.4 - 5.0 g/dL Final     ALT   Date Value Ref Range Status   06/05/2024 20 10 - 49 U/L Final     AST   Date Value Ref Range Status   06/05/2024 23 <=34 U/L Final     Alkaline Phosphatase   Date Value Ref Range Status   06/05/2024 75 46 - 116 U/L Final     LDH   Date Value Ref Range Status   05/23/2024 301 (H) 120 - 246 U/L Final        DONOR STUDIES:  Type of stem cells: unrelated female  Blood Type: O-  CMV Status: negative  Type of match: 10/10    PATIENT PRE-TRANSPLANT STUDIES:   Blood Type: O+  CXR: No airspace disease  PFTS: FVC: 92%  FEV1: 85% DLCO ([place]): 73%  ECHO: EF: 55-60% on 05/23/24, improved from 45-50% on 04/25/24  EKG: NSR, prolonged QTcf 489    Bone marrow biopsy: 05/21/24  Bone marrow, left iliac,  aspiration and biopsy  -  Normocellular bone marrow (60%) with trilineage hematopoiesis and 3% blasts by manual aspirate differential  -  Flow cytometric MRD analysis reveals no definitive immunophenotypic evidence of residual B-lymphoblastic leukemia/lymphoma   - BCR ABL p190 not detected  - clonoSeq immunoglobulin sequencing negative      Hep B S Ab   Date Value Ref Range Status   11/25/2023 Reactive (A) Nonreactive, [place] Final     Hep B Core Total Ab   Date Value Ref Range Status   05/23/2024 Reactive (A) Nonreactive Final     Hepatitis C Ab   Date Value Ref Range Status   05/23/2024 Nonreactive Nonreactive Final     Comment:     Antibodies to HCV were not detected.  A nonreactive result does not exclude the possibility of exposure to HCV.     CMV IGG   Date Value Ref Range Status   05/29/2024 Positive (A) Negative Final     RPR   Date Value Ref Range Status   05/23/2024 Nonreactive Nonreactive Final     EBV VCA IgG Antibody   Date Value Ref Range Status   05/23/2024 Positive (A) Negative Final     EBV VCA IgM Antibody   Date Value Ref Range Status   05/23/2024 Negative Negative Final     EBV Nuclear Ag IgG Antibody   Date Value Ref Range Status   05/23/2024 Positive (A) Negative Final     VRE Screen   Date Value Ref Range Status   05/23/2024 NOT DETECTED  Final     HSV 1 IgG   Date Value Ref Range Status   05/23/2024 Positive (A) Negative Final     HSV 2 IgG   Date Value Ref Range Status   05/23/2024 Positive (A) Negative Final     Varicella IgG   Date Value Ref Range Status   05/23/2024 Positive  Final     VZV IgG s/co   Date Value Ref Range Status   05/23/2024 14.7  Final     Toxoplasma Gondii IgG   Date Value Ref Range Status   05/23/2024 Negative Negative Final      Pregnancy test: negative    Assessment/Plan:    BMT: ALL   CR MRD Negative  HCT-CI (age adjusted) 47 (age, anxiety, reduced DLCO, HF (reduced EF), T2DM, CMV viremia)  Conditioning:.  1. Fludarabine 40 mg/m2 days -5, -4, -3, -2  2. Melphalan 100  mg/m2 day -1     Donor: 10/10, Female, ABO O-, CMV negative  Engraftment: G-CSF starting Day + 5 through WBC recovery (as defined as ANC 1.0 x 2 days or 3.0 x 1 day)  -Date of last G-CSF injection: TBD    GVHD prophylaxis:   1.Tacrolimus starting on day -3 (goal 5-10 ng/mL)  2. Methotrexate  5 mg/m2 IVP on days +1, +3, +6 and +11  3.  ATG per [facility] standard dosing will not be included    Hem: Transfusion criteria: Transfuse 1 unit of PRBCs for hemoglobin <7 and 1 unit of platelets for Plt <10K or bleeding. [name] [name] does not have a history of transfusion reactions. Consent was obtained and documented on 06/05/24.    ID:   Prophylaxis:  -Antiviral: Valtrex  500 mg po BID started on admission and continue for 2 year  Letermovir  indicated as R/CMV + D/CMV - and with prior hx of CMV viremia, plan to continue on admission through at least day +200  -Antifungal: Fluconazole   400 mg po daily to start day 0 thorugh day +75  -Antibacterial: Levaquin  500 mg po daily to start day 0, continue while neutropenic  -PJP: Sulfa allergic. Dapsone  daily upon platelet engraftment, has tolerated this well in the past. Toxo PCR negative.   -11/7 stopped Dapsone  upon admission, restart with platelet engraftment.     Patient was screened for upper respiratory symptoms, including S/S of Covid-19. bmt prescreen symptoms: Patient reports upper respiratory symptoms.  bmtu rpp testing: RPP to be ordered and performed ahead of BMTU admission. Patient educated on continued need to inform medical team of any new symptoms.    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   Previously required Valcyte  treatment  -Continue Letermovir  and Valtrex  BID through day +200 post SCT.     Recurrent acute sinusitis  Recurrent acute sinusitis with nasal congestion, frontal headache, and low-grade fever. Responded well to Augmentin .  -11/7 current sx resolving, started Augmentin  10 days (10/30-11/9)    Hypogammaglobulinemia  Monthly IVIG - last on 05/16/2024  - 11/7 check weekly IgG levels     GI:   GERD prophylaxis:  -Pepcid  BID    Nausea:  -Anti-emetics per BMT protocol.     Diarrhea:  -Antidiarrheals per BMT protocol    Renal: Creatinine normal. No issues.     FEN:    Electrolyte replacement per protocol    Hepatic: Normal bilirubin. SOS prophylaxis not indicated with RIC regimen.     CV: Followed by [facility] Cardiology: Heart failure with mildly reduced ejection fraction (HFmrEF)   Hx of EF 45-50%, most recent Echo 05/23/24 improved to 55-60%  Hx of prolonged QTc, last EKG 489 follow closely with addition of QTc prolonging medications  -11/7 QTc 457. Check weekly EKGs (next on 11/14), added cardiac lytes     Hypertension: Home med Losartan  50 mg daily.     Pulm: No pulmonary symptoms currently. Reduced DLCO     Endocrine:   Menses suppression: Lupron  last 07/26/22.  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 At home uses Dexcom for monitoring.  -11/7 ACHS and SSI while inpatient. Lantus  25 units in AM. Can consider using Dexcom next week if numbers similar to hospital glucometer results     Neuro/Pain:  - PRN tramadol  50 mg added. No oxy as this give patient migraines     Psych: Followed by Palliative Care    Anxiety: Home med: Lexapro  10 mg daily   Reconnected with her local therapist   - If pt continues to endorse anxiety, may increase Lexapro  to 20 mg daily  - 11/7 added PRN ativan  0.5 mg Q8    Caregivers: Lives in [place], [place]  Primary caregiver: Jeanie Loscalzo (mom)- first 30 days at [facility] then home if doing well will stay longer if medically needed  Secondary caregiver: Devaughn Ranger (husband)    Consults: While inpatient, consider consults to music therapy, integrative medicine and massage therapy.     Shelley Cherry, AGNP  Bone Marrow Transplant and Cellular Therapy Program               [1]   Past Medical History:  Diagnosis Date    Anxiety     B-cell acute lymphoblastic leukemia (ALL)    (CMS-HCC) 10/29/2021    Cytomegalovirus (CMV) viremia    (CMS-HCC)     Diabetes mellitus    (CMS-HCC)     Hypertension    [2]  Past Surgical History:  Procedure Laterality Date    BREAST CYST EXCISION Right     age 52    CHEMOTHERAPY      leukemia    IR INSERT PORT AGE GREATER THAN 5 YRS  12/08/2021    IR INSERT PORT AGE GREATER THAN 5 YRS 12/08/2021 Charmaine Arloa Provencal, PA IMG VIR [facility]   [3]   Social History  Tobacco Use   Smoking Status Former    Current packs/day: 0.00    Types: Cigarettes    Quit date: 11/12/2015    Years since quitting: 8.5   Smokeless Tobacco Never   [4] No family history on file.  [5]   Allergies  Allergen Reactions    Other      Pt cant take ibuprofen due to condition.    Sulfa (Sulfonamide Antibiotics) Anaphylaxis     Other reaction(s): Not available    Azithromycin Hives     Per patient report    Erythromycin Hives     Other reaction(s): Not available    Clindamycin      Other Reaction(s): GI discomfort     Metoprolol Other (See Comments)     Other Reaction(s): Edema   [6] No current facility-administered medications for this encounter.     Current Outpatient Medications   Medication Sig Dispense Refill    albuterol  HFA 90 mcg/actuation inhaler Inhale 2 puffs every six (6) hours as needed for wheezing. 8 g 1    amoxicillin -clavulanate (AUGMENTIN ) 875-125 mg per tablet Take 1 tablet by mouth two (2) times a day for 10 days. 20 tablet 0    aspirin  (ECOTRIN) 81 MG tablet Take 1 tablet (81 mg total) by mouth daily.      blood sugar diagnostic (GLUCOSE BLOOD) Strp Use to check blood sugar two times a day. 100 each 0    blood-glucose meter kit Use to check blood sugar two times a day. Use as instructed. 1 each 0    blood-glucose sensor (DEXCOM G7 SENSOR) Devi 1 each by Miscellaneous route every ten (10) days. 9 each 3    cetirizine  (ZYRTEC ) 10 MG tablet Take 1 tablet (10 mg total) by mouth daily.      dapsone  100 MG tablet Take 1 tablet (100 mg total) by mouth daily. TAKE 1 TABLET(100 MG) BY MOUTH DAILY 30 tablet 11    escitalopram  oxalate (LEXAPRO ) 10 MG tablet Take 1 tablet (10 mg total) by mouth daily. 90 tablet 2    insulin  aspart (NOVOLOG  FLEXPEN) 100 unit/mL (3 mL) injection pen Use up to 30 units/day, divided TID AC meals as per MD instructions 30 mL 12    insulin  glargine (LANTUS  SOLOSTAR U-100 INSULIN ) 100 unit/mL (3 mL) injection pen Inject 0.25 mL (25 Units total) under the skin daily. 3 mL 10    lancets Misc Use to check blood sugar as directed 2 times a day & for symptoms of high or low blood sugar. 100 each 0    letermovir  (PREVYMIS ) 480 mg tablet Take 1 tablet (480 mg total) by mouth at bedtime. 28 tablet 5    LORazepam  (ATIVAN ) 0.5 MG tablet Take 1 tablet (0.5 mg total) by mouth every eight (8) hours as needed for anxiety. 6 tablet 0    losartan  (COZAAR ) 50 MG tablet Take 1 tablet (50 mg total) by mouth in the morning. 100 tablet 3    valACYclovir  (VALTREX ) 500 MG tablet Take 1 tablet (500 mg total) by mouth daily. 30 tablet 12

## 2024-06-07 ENCOUNTER — Encounter: Admit: 2024-06-07 | Payer: PRIVATE HEALTH INSURANCE

## 2024-06-07 ENCOUNTER — Encounter: Admit: 2024-06-07 | Payer: PRIVATE HEALTH INSURANCE | Attending: Internal Medicine

## 2024-06-07 ENCOUNTER — Ambulatory Visit: Admit: 2024-06-07 | Payer: PRIVATE HEALTH INSURANCE

## 2024-06-07 ENCOUNTER — Inpatient Hospital Stay
Admission: RE | Admit: 2024-06-07 | Discharge: 2024-06-27 | Disposition: A | Discharge status: Home / Self Care | Payer: PRIVATE HEALTH INSURANCE | Admitting: Student in an Organized Health Care Education/Training Program

## 2024-06-07 ENCOUNTER — Inpatient Hospital Stay: Admit: 2024-06-07 | Discharge: 2024-06-08 | Payer: PRIVATE HEALTH INSURANCE

## 2024-06-07 ENCOUNTER — Encounter
Admission: RE | Admit: 2024-06-07 | Discharge: 2024-06-27 | Disposition: A | Discharge status: Home / Self Care | Payer: PRIVATE HEALTH INSURANCE | Attending: Internal Medicine | Admitting: Student in an Organized Health Care Education/Training Program

## 2024-06-07 ENCOUNTER — Encounter: Admit: 2024-06-07 | Discharge: 2024-06-27 | Payer: PRIVATE HEALTH INSURANCE | Attending: Internal Medicine

## 2024-06-07 DIAGNOSIS — C9100 Acute lymphoblastic leukemia not having achieved remission: Principal | ICD-10-CM

## 2024-06-07 LAB — HEPATIC FUNCTION PANEL
ALBUMIN: 3.7 g/dL (ref 3.4–5.0)
ALKALINE PHOSPHATASE: 67 U/L (ref 46–116)
ALT (SGPT): 23 U/L (ref 10–49)
AST (SGOT): 25 U/L (ref <=34)
BILIRUBIN DIRECT: 0.1 mg/dL (ref 0.00–0.30)
BILIRUBIN TOTAL: 0.3 mg/dL (ref 0.3–1.2)
PROTEIN TOTAL: 6.6 g/dL (ref 5.7–8.2)

## 2024-06-07 LAB — LACTATE DEHYDROGENASE: LACTATE DEHYDROGENASE: 260 U/L — ABNORMAL HIGH (ref 120–246)

## 2024-06-07 LAB — CBC W/ AUTO DIFF
BASOPHILS ABSOLUTE COUNT: 0 10*9/L (ref 0.0–0.1)
BASOPHILS RELATIVE PERCENT: 0.6 %
EOSINOPHILS ABSOLUTE COUNT: 0.1 10*9/L (ref 0.0–0.5)
EOSINOPHILS RELATIVE PERCENT: 2.6 %
HEMATOCRIT: 32.5 % — ABNORMAL LOW (ref 34.0–44.0)
HEMOGLOBIN: 11.2 g/dL — ABNORMAL LOW (ref 11.3–14.9)
LYMPHOCYTES ABSOLUTE COUNT: 0.9 10*9/L — ABNORMAL LOW (ref 1.1–3.6)
LYMPHOCYTES RELATIVE PERCENT: 25.9 %
MEAN CORPUSCULAR HEMOGLOBIN CONC: 34.6 g/dL (ref 32.0–36.0)
MEAN CORPUSCULAR HEMOGLOBIN: 32.5 pg — ABNORMAL HIGH (ref 25.9–32.4)
MEAN CORPUSCULAR VOLUME: 94 fL (ref 77.6–95.7)
MEAN PLATELET VOLUME: 10.8 fL — ABNORMAL HIGH (ref 6.8–10.7)
MONOCYTES ABSOLUTE COUNT: 0.4 10*9/L (ref 0.3–0.8)
MONOCYTES RELATIVE PERCENT: 10.7 %
NEUTROPHILS ABSOLUTE COUNT: 2 10*9/L (ref 1.8–7.8)
NEUTROPHILS RELATIVE PERCENT: 60.2 %
PLATELET COUNT: 119 10*9/L — ABNORMAL LOW (ref 150–450)
RED BLOOD CELL COUNT: 3.45 10*12/L — ABNORMAL LOW (ref 3.95–5.13)
RED CELL DISTRIBUTION WIDTH: 15.2 % (ref 12.2–15.2)
WBC ADJUSTED: 3.4 10*9/L — ABNORMAL LOW (ref 3.6–11.2)

## 2024-06-07 LAB — PROTIME-INR
INR: 1.06
PROTIME: 12.1 s (ref 9.9–12.6)

## 2024-06-07 LAB — APTT
APTT: 31.9 s (ref 24.8–38.4)
HEPARIN CORRELATION: 0.2

## 2024-06-07 LAB — BASIC METABOLIC PANEL
ANION GAP: 14 mmol/L (ref 5–14)
BLOOD UREA NITROGEN: 13 mg/dL (ref 9–23)
BUN / CREAT RATIO: 26
CALCIUM: 9 mg/dL (ref 8.7–10.4)
CHLORIDE: 105 mmol/L (ref 98–107)
CO2: 24 mmol/L (ref 20.0–31.0)
CREATININE: 0.5 mg/dL — ABNORMAL LOW (ref 0.55–1.02)
EGFR CKD-EPI (2021) FEMALE: >90 mL/min/1.73m2 (ref >=60)
GLUCOSE RANDOM: 194 mg/dL — ABNORMAL HIGH (ref 70–179)
POTASSIUM: 3.1 mmol/L — ABNORMAL LOW (ref 3.4–4.8)
SODIUM: 143 mmol/L (ref 135–145)

## 2024-06-07 LAB — MAGNESIUM: MAGNESIUM: 1.7 mg/dL (ref 1.6–2.6)

## 2024-06-07 LAB — IONIZED CALCIUM VENOUS: CALCIUM IONIZED VENOUS (MG/DL): 4.9 mg/dL (ref 4.40–5.40)

## 2024-06-07 LAB — URIC ACID: URIC ACID: 2.8 mg/dL — ABNORMAL LOW (ref 3.1–7.8)

## 2024-06-07 LAB — PHOSPHORUS: PHOSPHORUS: 3.8 mg/dL (ref 2.4–5.1)

## 2024-06-07 MED ORDER — TACROLIMUS 0.5 MG CAPSULE, IMMEDIATE-RELEASE
ORAL_CAPSULE | Freq: Two times a day (BID) | ORAL | 0 refills | 30.00000 days | Status: CP
Start: 2024-06-07 — End: 2024-06-07

## 2024-06-07 MED ADMIN — lidocaine-EPINEPHrine (XYLOCAINE W/EPI) 1 %-1:100,000 injection: SUBCUTANEOUS | @ 13:00:00 | Stop: 2024-06-07

## 2024-06-07 MED ADMIN — potassium chloride 20 mEq in 100 mL IVPB Premix: 20 meq | INTRAVENOUS | @ 21:00:00 | Stop: 2025-06-07

## 2024-06-07 MED ADMIN — potassium chloride 20 mEq in 100 mL IVPB Premix: 20 meq | INTRAVENOUS | @ 20:00:00 | Stop: 2025-06-07

## 2024-06-07 MED ADMIN — potassium chloride 20 mEq in 100 mL IVPB Premix: 20 meq | INTRAVENOUS | @ 23:00:00 | Stop: 2025-06-07

## 2024-06-07 MED ADMIN — potassium chloride 20 mEq in 100 mL IVPB Premix: 20 meq | INTRAVENOUS | @ 18:00:00 | Stop: 2025-06-07

## 2024-06-07 MED ADMIN — fentaNYL (PF) (SUBLIMAZE) injection: INTRAVENOUS | @ 13:00:00 | Stop: 2024-06-07

## 2024-06-07 MED ADMIN — sodium chloride (NS) 0.9 % infusion: 20 mL/h | INTRAVENOUS | @ 18:00:00

## 2024-06-07 MED ADMIN — traMADol (ULTRAM) tablet 50 mg: 50 mg | ORAL | @ 18:00:00

## 2024-06-07 MED ADMIN — ceFAZolin (ANCEF) IVPB 1 g in 50 mL dextrose (premix): 1 g | INTRAVENOUS | @ 13:00:00 | Stop: 2024-06-07

## 2024-06-07 MED ADMIN — heparin (porcine) 1000 unit/mL injection: INTRAVENOUS | @ 14:00:00 | Stop: 2024-06-07

## 2024-06-07 MED ADMIN — sodium chloride (NS) 0.9 % infusion: 100 mL/h | INTRAVENOUS | @ 23:00:00

## 2024-06-07 MED ADMIN — fludarabine (FLUDARA) 69.25 mg in sodium chloride (NS) 0.9 % 50 mL IVPB: 40 mg/m2 | INTRAVENOUS | Stop: 2024-06-07

## 2024-06-07 MED ADMIN — midazolam (PF) (VERSED) 1 mg/mL injection: INTRAVENOUS | @ 13:00:00 | Stop: 2024-06-07

## 2024-06-07 MED ADMIN — midazolam (PF) (VERSED) 1 mg/mL injection 1 mg: 1 mg | INTRAVENOUS | @ 13:00:00 | Stop: 2024-06-07

## 2024-06-07 NOTE — Op Note (Signed)
 VIR Post-Procedure Note:   06/07/2024 8:39 AM    Procedure Name: tunneled triple lumen Hickman catheter placement    Pre-Op Diagnosis and Indication: ALL, chemotherapy    Post-Op Diagnosis: Same as pre-operative diagnosis    VIR Provider: Jed Cap, MD, MD    Time out:   Prior to the procedure, a time out was performed with all team members present. During the time out, the patient, procedure and procedure site when applicable were verbally verified by the team members and Dr. Jed Cap, MD.     Description of procedure:   Successful placement of left IJV tunneled triple lumen Hickman catheter under ultrasound and fluoroscopy with no complication.    Sedation: Moderate sedation    Estimated Blood Loss: less than 5 mL  Complications: None    See detailed procedure note with images in PACS Lennice).    The patient tolerated the procedure well without incident or complication.    Interventional Radiologist: Jed Cap, MD, MD  Date/Time: 06/07/2024 8:39 AM

## 2024-06-07 NOTE — Consults (Incomplete)
 Adult Nutrition Assessment Note    Visit Type: MD Consult (NP Consult)  Reason for Visit: Have you gained or lost 10 pounds in the past 3 months?    NUTRITION INTERVENTIONS and RECOMMENDATION     ***    NUTRITION ASSESSMENT     {RD Nutrition Assessment List:112963}    NUTRITIONALLY RELEVANT DATA     HPI & PMH:   Per record, Patricia [name] is a 48 y.o. 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  Patricia [name] now presents for a matched unrelated donor stem cell transplant.     Nutrition History:   ***    Medications:  Nutritionally pertinent medications reviewed and evaluated for potential food and/or medication interactions.     Labs:   Nutritionally pertinent labs reviewed.     Nutritional Needs:   Healthy balance of carbohydrate, protein, and fat.     Anthropometric Data:  Height: 168 cm (5' 6.14)   Admission weight: 79.2 kg (174 lb 11.2 oz)  Last recorded weight: 79.2 kg (174 lb 11.2 oz) (06/07/24)  IBW: 59.33 kg  BMI: Body mass index is 28.08 kg/m??.   Usual Body Weight: {RD Weight List:112952}  Weight Assessment: 4.3% loss from 04/16/24 to 06/07/24, not significant    Wt Readings from Last 12 Encounters:   06/07/24 79.2 kg (174 lb 11.2 oz)   06/05/24 79.7 kg (175 lb 11.3 oz)   05/29/24 78.8 kg (173 lb 11.6 oz)   05/29/24 79.8 kg (175 lb 14.8 oz)   05/23/24 78.7 kg (173 lb 6.4 oz)   05/17/24 79.6 kg (175 lb 9.5 oz)   05/16/24 79.9 kg (176 lb 2.4 oz)   05/09/24 80.4 kg (177 lb 4 oz)   05/02/24 81.4 kg (179 lb 5.5 oz)   04/25/24 81.7 kg (180 lb 0.1 oz)   04/18/24 81.9 kg (180 lb 9.6 oz)   04/16/24 82.7 kg (182 lb 6.4 oz)         Malnutrition Assessment:  {RD Malnutrition Assessment List:112942}    Nutrition Focused Physical Exam:  {RD NFPE List:112943}    Care plan:  {RD Care Plan List:112944}    Current Nutrition:  {RD Current Nutrition List:112945}  Nutrition Orders            Nutrition Therapy Immunosuppressed starting at 11/07 1018          Nutritionally Pertinent Allergies, Intolerances, Sensitivities, and/or Cultural/Religious Restrictions:  {RD Allergy Review List:112951::none identified at this time }    GOALS and EVALUATION     {RD Goals List:112954}    Motivation, Barriers, and Compliance:  {RD Motivation Assessment List:112956}    Discharge Planning:   {RD Discharge Planning List:112958}    {RD Interpreter List:112960}    Follow-Up Parameters:   {RD Follow Up Parameters List:112959::1-2 times per week (and more frequent as indicated)}    ***

## 2024-06-07 NOTE — Progress Notes (Signed)
 Stem Cell Infusion Order (Allogeneic Recipient):    Date of Stem Cell Infusion: 06/12/24    Recipient Name: AMBROSIA WISNEWSKI    Recipient Medical Record Number: [id number]    SELECTED BMT DONOR     Stem Cell Source:Peripheral blood  Fresh or Cryopreserved:  Planned Fresh  Donor Sex:  Female  Donor Type: Unrelated  HLA Match Grade:10/10  DP Match: Permissive  HLA Mismatch @: N/A      Donor ABOBETHA MALVA Barb  Donor Medical Number: [id number]         Related Donor Name: n/a  Donor NMDP GRID:  [id number]   CMV Donor Status  Neg   Donor Weight (kg): 84kg

## 2024-06-07 NOTE — Progress Notes (Signed)
 Left internal jugular Hickman dressing clean, dry, and intact.  Discharge instructions reviewed and given to pt and family.  Understanding verbalized.  PIV removed with tip intact.  Home supplies provided.  Pt ambulatory without assistance.  Discharged from PRU in stable condition.

## 2024-06-07 NOTE — Consults (Signed)
 Spiritual Care Visit Note    Assessment Summary:     Initial visit for spiritual-emotional support regarding consult. [name] husband Patricia [name] was present. Patricia [name] and [employer] reflected on journey/experience, Patricia [name] remarking on feelings of nervousness as well as preparedness and hope, expressing how current situation is both similar to and different from time she received diagnosis. Patricia [name] identified as Patricia [name] and shared that she and Patricia [name] are part of home church community. Chaplain provided reflective listening, exploration and normalization of feelings, as well as education on Blessing of the Cells ritual on day of transplant. Patricia [name] and Patricia [name] expressed receptivity to said ritual and expressed gratitude for visit and support. Chaplain will continue to follow.    Clinical Encounter Type  Type of Visit: Initial visit  Care Provided To: Patient and family together  Referral Source: APP  On-Call Visit?: No  Reason for Visit: Routine spiritual support  Minutes Spent: 15 minutes       Spiritual Assessment  Faith: None, None Given  Presenting Concern(s): Meaning/Purpose, Worry, Recent/Multiple admissions  Beliefs: Christian  Emotions: Calm, reflective, nervous  Community: Faith community, Family, Friend(s), Significant other  Needs: Presence, Blessing of the Cells  Hopes: Heritage Manager, patience  Resources: Faith, family, friends, church, spouse    Spiritual Care Interventions  Interventions Made: Established relationship of care and support, Compassionate presence, Supported patient's sources of spiritual strength, Explored feelings, Normalization of Emotions, Education, Hospitality  Outcomes: Appreciated Chaplain visit      Spiritual Care Plan  Spiritual Care Plan: Chaplain will continue to follow.       Signed: Ziyonna Christner J Kebra [name], Chaplain 3:01 PM 06/07/2024       Patricia [name], MDiv, [facility]  Second-Year Chaplain Resident  Spiritual Health and Education  Pager: [phone]

## 2024-06-07 NOTE — H&P (Signed)
 [facility] - Pre Procedure H/P  [name]  CSN: [id number]  MRN: [id number]  Date of Procedure:       Assessment/Plan:    Ms. Ranger is a 48 y.o. female who will undergo tunneled triple lumen Hickman catheter placement in Interventional Radiology.    Consent obtained in the Pre Op holding area by Dr Babara.  Risks, benefits, and alternatives including but not limited to bleeding, vessel injury were discussed with patient/patient's representative. All questions were answered to patient/patient's representative satisfaction.  Patient/Patient's representative consents and would like to proceed with the procedure.   --The patient will accept blood products in an emergent situation.  --The patient does not have a Do Not Resuscitate order in effect.      HPI: Ms. Ranger is a 48 y.o. female with ALL, undergoing tunneled triple lumen catheter placement under moderate sedation    Past Medical History[1]    Past Surgical History[2]     Allergies: Allergies[3]    Medications:  No relevant medications, please see full medication list in Epic.    ASA Grade: ASA 1 - Normal healthy patient    PE:    Vitals:    06/07/24 0715   BP: 116/81   Pulse: 80   Resp: 16   Temp: 36.7 ??C (98 ??F)   SpO2: 98%     General: female in NAD.  Airway assessment: Class 2 - Can visualize soft palate and fauces, tip of uvula is obscured  Lungs: Respirations nonlabored            [1]   Past Medical History:  Diagnosis Date    Anxiety     B-cell acute lymphoblastic leukemia (ALL)    (CMS-HCC) 10/29/2021    Cytomegalovirus (CMV) viremia    (CMS-HCC)     Diabetes mellitus (CMS-HCC)     Hypertension    [2]   Past Surgical History:  Procedure Laterality Date    BREAST CYST EXCISION Right     age 36    CHEMOTHERAPY      leukemia    IR INSERT PORT AGE GREATER THAN 5 YRS  12/08/2021    IR INSERT PORT AGE GREATER THAN 5 YRS 12/08/2021 Courtney Harris Evron, PA IMG VIR [facility]   [3]   Allergies  Allergen Reactions    Other Pt cant take ibuprofen due to condition.    Sulfa (Sulfonamide Antibiotics) Anaphylaxis     Other reaction(s): Not available    Azithromycin Hives     Per patient report    Erythromycin Hives     Other reaction(s): Not available    Clindamycin      Other Reaction(s): GI discomfort     Metoprolol Other (See Comments)     Other Reaction(s): Edema

## 2024-06-08 LAB — BASIC METABOLIC PANEL
ANION GAP: 12 mmol/L (ref 5–14)
BLOOD UREA NITROGEN: 14 mg/dL (ref 9–23)
BUN / CREAT RATIO: 25
CALCIUM: 8.7 mg/dL (ref 8.7–10.4)
CHLORIDE: 109 mmol/L — ABNORMAL HIGH (ref 98–107)
CO2: 25 mmol/L (ref 20.0–31.0)
CREATININE: 0.56 mg/dL (ref 0.55–1.02)
EGFR CKD-EPI (2021) FEMALE: >90 mL/min/1.73m2 (ref >=60)
GLUCOSE RANDOM: 96 mg/dL (ref 70–179)
POTASSIUM: 3.7 mmol/L (ref 3.4–4.8)
SODIUM: 146 mmol/L — ABNORMAL HIGH (ref 135–145)

## 2024-06-08 LAB — CBC W/ AUTO DIFF
BASOPHILS ABSOLUTE COUNT: 0 10*9/L (ref 0.0–0.1)
BASOPHILS RELATIVE PERCENT: 0.8 %
EOSINOPHILS ABSOLUTE COUNT: 0.1 10*9/L (ref 0.0–0.5)
EOSINOPHILS RELATIVE PERCENT: 3.5 %
HEMATOCRIT: 30.1 % — ABNORMAL LOW (ref 34.0–44.0)
HEMOGLOBIN: 10.4 g/dL — ABNORMAL LOW (ref 11.3–14.9)
LYMPHOCYTES ABSOLUTE COUNT: 0.8 10*9/L — ABNORMAL LOW (ref 1.1–3.6)
LYMPHOCYTES RELATIVE PERCENT: 27.1 %
MEAN CORPUSCULAR HEMOGLOBIN CONC: 34.6 g/dL (ref 32.0–36.0)
MEAN CORPUSCULAR HEMOGLOBIN: 32.8 pg — ABNORMAL HIGH (ref 25.9–32.4)
MEAN CORPUSCULAR VOLUME: 94.9 fL (ref 77.6–95.7)
MEAN PLATELET VOLUME: 11 fL — ABNORMAL HIGH (ref 6.8–10.7)
MONOCYTES ABSOLUTE COUNT: 0.3 10*9/L (ref 0.3–0.8)
MONOCYTES RELATIVE PERCENT: 10.7 %
NEUTROPHILS ABSOLUTE COUNT: 1.7 10*9/L — ABNORMAL LOW (ref 1.8–7.8)
NEUTROPHILS RELATIVE PERCENT: 57.9 %
PLATELET COUNT: 96 10*9/L — ABNORMAL LOW (ref 150–450)
RED BLOOD CELL COUNT: 3.17 10*12/L — ABNORMAL LOW (ref 3.95–5.13)
RED CELL DISTRIBUTION WIDTH: 15 % (ref 12.2–15.2)
WBC ADJUSTED: 3 10*9/L — ABNORMAL LOW (ref 3.6–11.2)

## 2024-06-08 LAB — MAGNESIUM: MAGNESIUM: 1.7 mg/dL (ref 1.6–2.6)

## 2024-06-08 MED ADMIN — famotidine (PEPCID) tablet 20 mg: 20 mg | ORAL | @ 15:00:00

## 2024-06-08 MED ADMIN — famotidine (PEPCID) tablet 20 mg: 20 mg | ORAL | @ 02:00:00

## 2024-06-08 MED ADMIN — escitalopram oxalate (LEXAPRO) tablet 10 mg: 10 mg | ORAL | @ 15:00:00

## 2024-06-08 MED ADMIN — losartan (COZAAR) tablet 50 mg: 50 mg | ORAL | @ 15:00:00

## 2024-06-08 MED ADMIN — magnesium sulfate 2gm/50mL IVPB: 2 g | INTRAVENOUS | @ 11:00:00

## 2024-06-08 MED ADMIN — magnesium sulfate 2gm/50mL IVPB: 2 g | INTRAVENOUS | @ 09:00:00

## 2024-06-08 MED ADMIN — cetirizine (ZYRTEC) tablet 10 mg: 10 mg | ORAL | @ 15:00:00

## 2024-06-08 MED ADMIN — HYDROmorphone (DILAUDID) injection Syrg 0.4 mg: .4 mg | INTRAVENOUS | Stop: 2024-06-10

## 2024-06-08 MED ADMIN — amoxicillin-clavulanate (AUGMENTIN) 875-125 mg per tablet 1 tablet: 1 | ORAL | @ 02:00:00 | Stop: 2024-06-10

## 2024-06-08 MED ADMIN — amoxicillin-clavulanate (AUGMENTIN) 875-125 mg per tablet 1 tablet: 1 | ORAL | @ 15:00:00 | Stop: 2024-06-10

## 2024-06-08 MED ADMIN — valACYclovir (VALTREX) tablet 500 mg: 500 mg | ORAL | @ 02:00:00

## 2024-06-08 MED ADMIN — valACYclovir (VALTREX) tablet 500 mg: 500 mg | ORAL | @ 15:00:00

## 2024-06-08 MED ADMIN — potassium chloride 20 mEq in 100 mL IVPB Premix: 20 meq | INTRAVENOUS | @ 11:00:00 | Stop: 2025-06-07

## 2024-06-08 MED ADMIN — potassium chloride 20 mEq in 100 mL IVPB Premix: 20 meq | INTRAVENOUS | @ 09:00:00 | Stop: 2025-06-07

## 2024-06-08 MED ADMIN — fludarabine (FLUDARA) 69.25 mg in sodium chloride (NS) 0.9 % 50 mL IVPB: 40 mg/m2 | INTRAVENOUS | @ 15:00:00 | Stop: 2024-06-11

## 2024-06-08 MED ADMIN — traMADol (ULTRAM) tablet 50 mg: 50 mg | ORAL | @ 09:00:00 | Stop: 2024-06-08

## 2024-06-08 MED ADMIN — morphine injection 1 mg: 1 mg | INTRAVENOUS | @ 11:00:00 | Stop: 2024-06-08

## 2024-06-09 LAB — CBC W/ AUTO DIFF
BASOPHILS ABSOLUTE COUNT: 0 10*9/L (ref 0.0–0.1)
BASOPHILS RELATIVE PERCENT: 0.9 %
EOSINOPHILS ABSOLUTE COUNT: 0.1 10*9/L (ref 0.0–0.5)
EOSINOPHILS RELATIVE PERCENT: 4.2 %
HEMATOCRIT: 29.8 % — ABNORMAL LOW (ref 34.0–44.0)
HEMOGLOBIN: 10.3 g/dL — ABNORMAL LOW (ref 11.3–14.9)
LYMPHOCYTES ABSOLUTE COUNT: 0.5 10*9/L — ABNORMAL LOW (ref 1.1–3.6)
LYMPHOCYTES RELATIVE PERCENT: 15.5 %
MEAN CORPUSCULAR HEMOGLOBIN CONC: 34.5 g/dL (ref 32.0–36.0)
MEAN CORPUSCULAR HEMOGLOBIN: 32.9 pg — ABNORMAL HIGH (ref 25.9–32.4)
MEAN CORPUSCULAR VOLUME: 95.4 fL (ref 77.6–95.7)
MEAN PLATELET VOLUME: 10.7 fL (ref 6.8–10.7)
MONOCYTES ABSOLUTE COUNT: 0.2 10*9/L — ABNORMAL LOW (ref 0.3–0.8)
MONOCYTES RELATIVE PERCENT: 6.6 %
NEUTROPHILS ABSOLUTE COUNT: 2.3 10*9/L (ref 1.8–7.8)
NEUTROPHILS RELATIVE PERCENT: 72.8 %
PLATELET COUNT: 90 10*9/L — ABNORMAL LOW (ref 150–450)
RED BLOOD CELL COUNT: 3.12 10*12/L — ABNORMAL LOW (ref 3.95–5.13)
RED CELL DISTRIBUTION WIDTH: 14.6 % (ref 12.2–15.2)
WBC ADJUSTED: 3.1 10*9/L — ABNORMAL LOW (ref 3.6–11.2)

## 2024-06-09 LAB — BASIC METABOLIC PANEL
ANION GAP: 11 mmol/L (ref 5–14)
BLOOD UREA NITROGEN: 10 mg/dL (ref 9–23)
BUN / CREAT RATIO: 20
CALCIUM: 8.5 mg/dL — ABNORMAL LOW (ref 8.7–10.4)
CHLORIDE: 106 mmol/L (ref 98–107)
CO2: 26 mmol/L (ref 20.0–31.0)
CREATININE: 0.51 mg/dL — ABNORMAL LOW (ref 0.55–1.02)
EGFR CKD-EPI (2021) FEMALE: >90 mL/min/1.73m2 (ref >=60)
GLUCOSE RANDOM: 163 mg/dL (ref 70–179)
POTASSIUM: 3.8 mmol/L (ref 3.4–4.8)
SODIUM: 143 mmol/L (ref 135–145)

## 2024-06-09 LAB — MAGNESIUM: MAGNESIUM: 2.1 mg/dL (ref 1.6–2.6)

## 2024-06-09 MED ADMIN — famotidine (PEPCID) tablet 20 mg: 20 mg | ORAL | @ 14:00:00

## 2024-06-09 MED ADMIN — famotidine (PEPCID) tablet 20 mg: 20 mg | ORAL | @ 02:00:00

## 2024-06-09 MED ADMIN — escitalopram oxalate (LEXAPRO) tablet 10 mg: 10 mg | ORAL | @ 14:00:00

## 2024-06-09 MED ADMIN — losartan (COZAAR) tablet 50 mg: 50 mg | ORAL | @ 14:00:00

## 2024-06-09 MED ADMIN — letermovir (PREVYMIS) tablet 480 mg: 480 mg | ORAL | @ 02:00:00

## 2024-06-09 MED ADMIN — prochlorperazine (COMPAZINE) injection 10 mg: 10 mg | INTRAVENOUS | @ 16:00:00

## 2024-06-09 MED ADMIN — cetirizine (ZYRTEC) tablet 10 mg: 10 mg | ORAL | @ 14:00:00

## 2024-06-09 MED ADMIN — HYDROmorphone (DILAUDID) injection Syrg 0.4 mg: .4 mg | INTRAVENOUS | @ 09:00:00 | Stop: 2024-06-10

## 2024-06-09 MED ADMIN — amoxicillin-clavulanate (AUGMENTIN) 875-125 mg per tablet 1 tablet: 1 | ORAL | @ 14:00:00 | Stop: 2024-06-10

## 2024-06-09 MED ADMIN — amoxicillin-clavulanate (AUGMENTIN) 875-125 mg per tablet 1 tablet: 1 | ORAL | @ 02:00:00 | Stop: 2024-06-10

## 2024-06-09 MED ADMIN — insulin lispro (HumaLOG) injection CORRECTIONAL 0-20 Units: 0-20 [IU] | SUBCUTANEOUS | @ 21:00:00

## 2024-06-09 MED ADMIN — insulin lispro (HumaLOG) injection CORRECTIONAL 0-20 Units: 0-20 [IU] | SUBCUTANEOUS | @ 17:00:00

## 2024-06-09 MED ADMIN — valACYclovir (VALTREX) tablet 500 mg: 500 mg | ORAL | @ 02:00:00

## 2024-06-09 MED ADMIN — valACYclovir (VALTREX) tablet 500 mg: 500 mg | ORAL | @ 14:00:00

## 2024-06-09 MED ADMIN — potassium chloride 20 mEq in 100 mL IVPB Premix: 20 meq | INTRAVENOUS | @ 09:00:00 | Stop: 2025-06-07

## 2024-06-09 MED ADMIN — fludarabine (FLUDARA) 69.25 mg in sodium chloride (NS) 0.9 % 50 mL IVPB: 40 mg/m2 | INTRAVENOUS | @ 15:00:00 | Stop: 2024-06-11

## 2024-06-09 MED ADMIN — tacrolimus (PROGRAF) capsule 3.5 mg: .045 mg/kg | ORAL | @ 14:00:00 | Stop: 2024-06-11

## 2024-06-09 MED ADMIN — insulin glargine (LANTUS) injection BASAL 10 Units: 10 [IU] | SUBCUTANEOUS | @ 17:00:00

## 2024-06-09 NOTE — Progress Notes (Signed)
 BONE MARROW TRANSPLANT AND CELLULAR THERAPY PROGRESS NOTE    [name]  MRN: [id number]  Encounter Date: 06/09/24    Referring physician:  Dr. Toribio Bunker  BMT Attending MD: Dr. Jeaneen    Disease:Ph+B ALL  Current disease status: CR MRD Negative  Type of Transplant: RIC MUD Allo  Graft Source: Fresh PBSCs  Transplant Day: -3    Interval History:  Patricia [name] is a 48 y.o. 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 Patricia [name] now presents for a matched unrelated donor stem cell transplant.      -No acute events overnight  -Reports line insertion site pain is getting better with Dilaudid  PRN. Received one dose overnight  - Blood glucose range 131-189, resumed Latus daily in AM at lower dose 10 units   - Denies N/V/D, request off cIVF. Encourage good oral intake.  - Received 20 meq of KCL overnight for K 3.8 per cardiac lytes protocol  -Otherwise she is afebrile and VSS. Tolerated well with conditioning.       Review of Systems:  A full system review was performed and was negative except as noted in the above interval history.    Temp:  [36.6 ??C (97.9 ??F)-36.8 ??C (98.2 ??F)] 36.6 ??C (97.9 ??F)  Pulse:  [70-87] 70  Resp:  [16-20] 18  BP: (118-133)/(71-96) 118/71  MAP (mmHg):  [84-106] 84  SpO2:  [96 %-100 %] 100 %    I/O last 3 completed shifts:  In: 3769 [P.O.:3769]  Out: 6550 [Urine:6550]  No intake/output data recorded.    Last 5 Recorded Weights    06/07/24 1129 06/08/24 0630 06/08/24 2009   Weight: 79.2 kg (174 lb 11.2 oz) 79.7 kg (175 lb 11.2 oz) 80.4 kg (177 lb 4.8 oz)     Weight change: 1.179 kg (2 lb 9.6 oz)    Test Results:   Reviewed in EPIC. Abnormal values discussed below.     Scheduled Meds:Scheduled Medications[1]  Continuous Infusions:Infusions Meds[2]  PRN Meds:.PRN Medications[3]    Physical Exam:  General : No acute distress noted.   Central venous access: Line clean, dry, intact. No erythema or drainage noted.   ENT: Moist mucous membranes. Oropharhynx without lesions, erythema or exudate.   Cardiovascular: Pulse normal rate, regularity and rhythm. S1 and S2 normal, without any murmur, rub, or gallop.  Lungs: Clear to auscultation bilaterally, without wheezes/crackles/rhonchi. Good air movement.   Skin: Warm, dry, intact. No rash noted.   Psychiatry: Alert and oriented to person, place, and time.   Gastrointestinal: Normoactive bowel sounds, abdomen soft, non-tender   Musculoskeletal/Extremities: Full range of motion in shoulder, elbow, hip knee, ankle, wrists and feet. No edema.   Neurologic: CNII-XII intact. Normal strength and sensation throughout    Assessment/Plan:    BMT: ALL   CR MRD Negative  HCT-CI (age adjusted) 50 (age, anxiety, reduced DLCO, HF (reduced EF), T2DM, CMV viremia)  Conditioning:.  1. Fludarabine 40 mg/m2 days -5, -4, -3, -2  2. Melphalan 100  mg/m2 day -1      Donor: 10/10, Female, ABO O-, CMV negative  Engraftment: G-CSF starting Day + 5 through WBC recovery (as defined as ANC 1.0 x 2 days or 3.0 x 1 day)  -Date of last G-CSF injection: TBD     GVHD prophylaxis:   1.Tacrolimus starting on day -3 (goal 5-10 ng/mL)  2. Methotrexate  5 mg/m2 IVP on days +1, +3, +6 and +11  3.  ATG per [facility], The standard dosing will  not be included     Hem: Transfusion criteria: Transfuse 1 unit of PRBCs for hemoglobin <7 and 1 unit of platelets for Plt <10K or bleeding. Patricia [name]. Patricia [name] does not have a history of transfusion reactions. Consent was obtained and documented on 06/05/24.    ID:   Prophylaxis:  -Antiviral: Valtrex  500 mg po BID started on admission and continue for 2 year  Letermovir  indicated as R/CMV + D/CMV - and with prior hx of CMV viremia, plan to continue on admission through at least day +200  -Antifungal: Fluconazole  400 mg po daily to start day 0 thorugh day +75  -Antibacterial: Levaquin  500 mg po daily to start day 0, continue while neutropenic  -PJP: Sulfa allergic. Dapsone  daily upon platelet engraftment, has tolerated this well in the past. Toxo PCR negative.   -11/7 stopped Dapsone  upon admission, restart with platelet engraftment.      Patient was screened for upper respiratory symptoms, including S/S of Covid-19. bmt prescreen symptoms: Patient reports upper respiratory symptoms.  bmtu rpp testing: RPP to be ordered and performed ahead of BMTU admission. Patient educated on continued need to inform medical team of any new symptoms.     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   Previously required Valcyte  treatment  -Continue Letermovir  and Valtrex  BID through day +200 post SCT.      Recurrent acute sinusitis  Recurrent acute sinusitis with nasal congestion, frontal headache, and low-grade fever. Responded well to Augmentin .  -11/7 current sx resolving, started Augmentin  10 days (10/30-11/9)     Hypogammaglobulinemia  Monthly IVIG - last on 05/16/2024  - 11/7 check weekly IgG levels     GI:   GERD prophylaxis:  -Pepcid  BID     Nausea:  -Anti-emetics per BMT protocol.      Diarrhea:  -Antidiarrheals per BMT protocol    Renal: Creatinine normal. No issues.      FEN:    Electrolyte replacement per protocol    Hepatic: Normal bilirubin. SOS prophylaxis not indicated with RIC regimen.     CV: Followed by [facility]: Heart failure with mildly reduced ejection fraction (HFmrEF)   Hx of EF 45-50%, most recent Echo 05/23/24 improved to 55-60%  Hx of prolonged QTc, last EKG 489 follow closely with addition of QTc prolonging medications  -11/7 QTc 457. Check weekly EKGs (next on 11/14), added cardiac lytes   -11/8: 40 meq of K  -11/9: 20 meq of K     Hypertension: Home med Losartan  50 mg daily.     Pulm: No pulmonary symptoms currently. Reduced DLCO      Endocrine:   Menses suppression: Lupron  last 07/26/22.  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 At home uses Dexcom for monitoring.  -11/7 ACHS and SSI while inpatient. Lantus  25 units in AM. Can consider using Dexcom next week if numbers similar to hospital glucometer results   -11/8: holding Lantus  due to morning POC 89  - 11/9: Blood glucose range 131 -189, resumed Lantus  at lower dose 10 Units    Neuro/Pain:  - PRN tramadol  50 mg added. No oxy as this give patient migraines   - 11/8: Tramadol  was not helping, Morphine  with mild relief. Ordered dilaudid  0.5mg  q8 prn x 2 days for severe pain, removed tramadol  prn    Psych: Followed by Palliative Care     Anxiety: Home med: Lexapro  10 mg daily  Reconnected with her local therapist   - If pt continues to endorse anxiety, may increase Lexapro  to 20 mg daily  - 11/7 added PRN ativan  0.5 mg Q8     Caregivers: Lives in [place], [place]  Primary caregiver: Jeanie Dimmick (mom)- first 30 days at [facility] then home if doing well will stay longer if medically needed  Secondary caregiver: Devaughn Ranger (husband)     Consults: While inpatient, consider consults to music therapy, integrative medicine and massage therapy.     Shawn Encarnacion Lucillie ELNITA  [facility] Bone Marrow Transplant and Cellular Therapy Program         [1]    amoxicillin -clavulanate  1 tablet Oral BID    cetirizine   10 mg Oral Daily    [START ON 06/11/2024] dexAMETHasone   12 mg Oral Once    escitalopram  oxalate  10 mg Oral Daily    famotidine   20 mg Oral BID    [START ON 06/12/2024] fluconazole   400 mg Oral Daily    fludarabine (FLUDARA) 69.25 mg in sodium chloride  (NS) 0.9 % 50 mL IVPB  40 mg/m2 (Treatment Plan Adjusted) Intravenous Q24H    [START ON 06/11/2024] fosaprepitant  150 mg Intravenous Once    heparin , porcine (PF)  2 mL Intravenous Mon,Wed,Fri    [Provider Hold] insulin  glargine  25 Units Subcutaneous Daily    insulin  lispro  0-20 Units Subcutaneous TID AC    letermovir   480 mg Oral Nightly    [START ON 06/12/2024] levoFLOXacin   500 mg Oral Daily    losartan   50 mg Oral Daily    [START ON 06/11/2024] melphalan  100 mg/m2 (Treatment Plan Adjusted) Intravenous Once    [START ON 06/11/2024] ondansetron   24 mg Oral Once    [START ON 06/11/2024] tacrolimus  0.03 mg/kg (Treatment Plan Recorded) Oral BID    tacrolimus  0.045 mg/kg (Treatment Plan Recorded) Oral BID    valACYclovir   500 mg Oral BID   [2]    IP okay to treat      sodium chloride  20 mL/hr (06/07/24 1326)    [Provider Hold] sodium chloride  Stopped (06/09/24 0007)   [3] albuterol , aluminum-magnesium  hydroxide-simethicone, CETAPHIL, dexAMETHasone , dextrose  in water, diphenhydrAMINE , emollient combination no.92, EPINEPHrine IM, famotidine  (PEPCID ) IV, glucagon , glucose, HYDROmorphone , IP okay to treat, lidocaine , lidocaine , loperamide , loperamide , LORazepam , magnesium  sulfate, methylPREDNISolone  sodium succinate , potassium chloride  in water, prochlorperazine  **OR** prochlorperazine , sodium chloride  0.9%

## 2024-06-09 NOTE — Plan of Care (Signed)
 Pt day -3 from Allo SCT. Pt A&O x4. VSS and afebrile this shift. PRN IV compazine  x1 given for nausea post-chemotherapy. CVAD dressing changed due to soiling. Plan of care reviewed with patient. Pt remained free from fall/injury this shift, call bell in reach.    Problem: Adult Inpatient Plan of Care  Goal: Absence of Hospital-Acquired Illness or Injury  Intervention: Identify and Manage Fall Risk  Recent Flowsheet Documentation  Taken 06/09/2024 0715 by Cristopher Ernst  Safety Interventions:   bleeding precautions   chemotherapeutic agent precautions   environmental modification   fall reduction program maintained   infection management   isolation precautions   lighting adjusted for tasks/safety   low bed   neutropenic precautions   nonskid shoes/slippers when out of bed  Intervention: Prevent Skin Injury  Recent Flowsheet Documentation  Taken 06/09/2024 0715 by Cristopher Ernst  Positioning for Skin: Supine/Back  Device Skin Pressure Protection: adhesive use limited  Skin Protection: adhesive use limited  Intervention: Prevent Infection  Recent Flowsheet Documentation  Taken 06/09/2024 0715 by Cristopher Ernst  Infection Prevention:   cohorting utilized   environmental surveillance performed   equipment surfaces disinfected   hand hygiene promoted   personal protective equipment utilized   rest/sleep promoted   single patient room provided   visitors restricted/screened     Problem: Infection  Goal: Absence of Infection Signs and Symptoms  Intervention: Prevent or Manage Infection  Recent Flowsheet Documentation  Taken 06/09/2024 0715 by Cristopher Ernst  Infection Management: aseptic technique maintained  Isolation Precautions: protective precautions maintained

## 2024-06-09 NOTE — Consults (Signed)
 OCCUPATIONAL THERAPY  Evaluation (06/09/24 0952)    Patient Name:  Patricia [name]       Medical Record Number: [id number]     Date of Birth: [birth year]  Sex: Female      Post-Discharge Occupational Therapy Recommendations: Skilled OT services NOT indicated          Equipment Recommendation  OT DME Recommendations: None       OT Treatment Diagnosis:  (At risk for deconditioning 2/2 SCT)         Assessment  Assessment: [name] is a 48 y.o. 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 Anntonette now presents for a matched unrelated donor stem cell transplant.   At this time pt presents with problem list as documented above, all of which limit ADL and functional mobility. Performance this date primarily limited by decreased endurance limiting safe and independent participation in ADLs and functional mobility. Pt would benefit from skilled occupational therapy services to maximize functional independence during transplant process and safely return to desired roles and occupations. Given current functional performance and caregiver support, recommending no follow-up OT services  to continue to promote safety and independence with ADLs and functional mobility.  DME recommendations: none identified at this time    Problem List: Decreased endurance  Personal Factors/Comorbidities (Occupational Profile and History Review): Expanded (Moderate)  Assessment of Occupational Performance : Balance, Endurance, Mobility, Fine or gross motor coordination, Strength  Clinical Decision Making: Moderate Complexity       Today's Interventions: EDUCATION: role of OT, OT POC, use of call bell to alert RN staff of needs and energy conservation strategies Pt verbalized understanding of all education and left with no questions at end of session. INTERVENTIONS: Pre-transplant assessment (FACIT Fatigue, Mini cog, grip strength, and ADL interview), bed mobility, room level functional mobility, standing grooming tasks (hand hygiene) , transfer training , LBD, occupational profile and evaluation , dynamic standing balance , and dynamic seated balance    FUNCTIONAL ASSESSMENT OF CHRONIC ILLNESS THERAPY (FACIT) FATIGUE SCALE (Version 4)     The FACIT Fatigue Scale is a short, 13-item, easy to administer tool that measures an individual's level of fatigue during their usual daily activities over the past week.     Scoring: Items are scored as follows: 4=Not At All; 3=A Little Bit; 2=Somewhat; 1=Quite A Bit; 0=Very Much, EXCEPT items #7 and #8 which are reversed scored. Score range 0-52. A score of less than 30 indicates severe fatigue. The higher the score, the better the quality of life.     Item Number Reverse Item?  Item Response Item Score   1 (I feel fatigued) 4 - 1 = 3   2 (I feel weak all over 4 - 0 = 4   3 (I feel listless (washed out)) 4 - 0 = 4   4 (I feel tired) 4 - 1 = 3   5 (I have trouble starting things because I'm tired) 4 - 1 = 3   6 (I have trouble finishing things because I'm tired) 4 - 1 = 3   7 (I have energy) 0 + 2 = 2   8 (I am able to do my usual activities) 0 + 3 = 3   9 (I need to sleep during the day) 4 - 1 = 3   10 I am too tired to eat) 4 - 0 = 4   11 (I need help doing my usual activities) 4 - 0 = 4  12 (I am frustrated by being too tired to do the things I want to do) 4 - 1 = 3   13 (I have to limit by social activity because I am tired) 4 - 1 = 3     TOTAL SCORE:  37    The Mini-Cog is a 3-minute instrument that can increase detection of cognitive impairment in older adults.The Mini-Cog?? is scored in two parts: 1) 3-item recall, and 2) clock drawing. These are added together for a total score. A cut off point of <3 on the Mini Cog has been validated for cognitive impairment screening. A total score of 3, 4, or 5 indicates lower likelihood of cognitive impairment but does not rule out some degree of cognitive impairment. Pt scored 3/5 on the Mini Cog.  (VERSION #1)  Pt scored a 2/2 on the clock draw and a 1/3 on the word recall.    Dynamometer Grip Strength Measurement  The purpose of using a hand dynamometer is to measure the maximum isometric strength of the hand and forearm muscles  in pounds .    L hand R hand    1st test: 45 lbs 50 lbs   2nd test: 45 lbs 45 lbs   3rd test: 50 lbs 45 lbs   Average:  44.6 lbs 44.6 lbs   Interpretation:  Pt's grip strength is within the normative range for their age.        ADL Interview  This modified measure focuses on occupational performance in in the self-care area of life.     Selected Occupation Satisfaction with Current Performance (0-10) Importance (0-10)   Showering 10 10   Walking 10 10   Crafting/Computer work  9 9        Activity Tolerance During Today's Session  Tolerated treatment well    Plan  Planned Frequency of Treatment: Plan of Care Initiated: 06/09/24  1-2x per day Weekly Frequency: 1-2 days per week  Planned Treatment Duration: 06/30/24    Planned Interventions:  Education (Patient/Family/Caregiver), Self-Care/Home Training, Therapeutic Exercise, Therapeutic Activity, Home Exercise Program      GOALS:        Short Term:   SHORT GOAL #1: Pt will participate in post-transplant assessments (including FACIT Fatigue, Mini Cog, Semi-Structured Interview, and Grip Strength) to increase safety and monitor symptoms prior to discharge   Time Frame : 3 weeks  SHORT GOAL #2: Pt will complete 10+ mins of standing ADL/IADLs with modI   Time Frame : 3 weeks  SHORT GOAL #3: Pt will accurately verbalize 2+ energy conservation strategies to incorporate into daily routines with modI   Time Frame : 3 weeks           Long Term Goal #1: Pt will score a 24/24 on the AMPAC  Time Frame: 4 weeks    Prognosis:  Good  Positive Indicators:  CLOF/PLOF  Barriers to Discharge: None    Subjective  Medical Updates Since Last Visit/Relevant PMH Affecting Clinical Decision Making:    Prior Functional Status At baseline, pt fully independent with ADLs/IADLs and functional mobility. Lives with her husband and 2 dogs. 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 Denied any falls.    Living Situation  Living Environment: House  Lives With: Spouse  Home Living: One level home, Stairs to enter without rails, Walk-in shower, Shower chair without back, Standard height toilet  Number of Stairs to Enter (outside): 1     Equipment available at home:  (shower chair)    Medical Tests / Procedures: Reviewed       Patient / Caregiver reports: Pt agreeable to OT      Past Medical History[1] Social History     Tobacco Use    Smoking status: Former     Current packs/day: 0.00     Types: Cigarettes     Quit date: 11/12/2015     Years since quitting: 8.5    Smokeless tobacco: Never   Substance Use Topics    Alcohol use: Not Currently      Past Surgical History[2] Family History[3]     Other, Sulfa (sulfonamide antibiotics), Azithromycin, Erythromycin, Clindamycin, Metoprolol, and Oxycodone      Objective Findings  Precautions / Restrictions  Chemo precautions       Weight Bearing  Precautions: Chemo precautions  Required Braces or Orthoses: Non-applicable    Required Braces or Orthoses  Non-applicable    Communication Preference  Verbal       Pain  Pt denied pain    Equipment / Environment  Vascular access (PIV, TLC, Port-a-cath, PICC)         Cognition   Orientation Level:  Oriented x 4   Arousal/Alertness:  Appropriate responses to stimuli   Attention Span:  Appears intact   Memory:  Appears intact   Following Commands:      Safety Judgment:  Good awareness of safety precautions   Awareness of Errors and Problem Solving:  Patient self-corrected errors   Comments:      Vision / Hearing   Vision: No acute deficits identified, Wears glasses for distance only, Wears glasses all the time     Hearing: No deficit identified         Hand Function:  Right Hand Function: Right hand grip strength, ROM and coordination WNL  Left Hand Function: Left hand grip strength, ROM and coordination WNL  Hand Dominance: Ambidextrous    Skin Inspection:  Skin Inspection: Intact where visualized    Face/Cervical ROM:  Face ROM: WFL  Cervical ROM: WFL    ROM / Strength:  UE ROM/Strength: Left WFL, Right WFL  LE ROM/Strength: Left WFL, Right WFL    Sensation:  RUE Sensation: RUE intact  LUE Sensation: LUE intact  RLE Sensation: RLE impaired  RLE Sensation Impairment: chronic peripheral neuropathy  LLE Sensation: LLE impaired  LLE Sensation Impairment: chronic peripheral neuropathy  Sensory/ Proprioception/ Stereognosis comments: Pt reports intermittent neuropathy in toes    Balance:  Static Sitting-Level of Assistance: Independent  Dynamic Sitting-Level of Assistance: Archivist Standing-Level of Assistance: Independent  Dynamic Standing - Level of Assistance: Independent    Functional Mobility  Transfers: Independent  Bed Mobility : Independent    ADLs  Feeding : Independent  Grooming: Independent  Bathing: Independent  Toileting: Independent  UB Dressing: Independent  LB Dressing: Independent    Vitals / Orthostatics  Vitals/Orthostatics: NAD    Patient at end of session: All needs in reach, In bed, Lines intact, Notified Nurse     Occupational Therapy Session Duration  OT Individual [mins]: 31         AM-PAC-Daily Activity  Lower Body Dressing assistance needs: None - Modified Independent/Independent  Bathing assistance needs: None - Modified Independent/Independent  Toileting assistance needs: None - Modified Independent/Independent  Upper Body Dressing assistance needs: None - Modified Independent/Independent  Personal Grooming assistance needs: None -  Modified Independent/Independent  Eating Meals assistance needs: None - Modified Independent/Independent    Daily Activity Score:  Daily Activity Score: 24    Score (in points): % of Functional Impairment, Limitation, Restriction  6: 100% impaired, limited, restricted  7-8: At least 80%, but less than 100% impaired, limited restricted  9-13: At least 60%, but less than 80% impaired, limited restricted  14-19: At least 40%, but less than 60% impaired, limited restricted  20-22: At least 20%, but less than 40% impaired, limited restricted  23: At least 1%, but less than 20% impaired, limited restricted  24: 0% impaired, limited restricted       I attest that I have reviewed the above information.  Signed: Waddell Marshall, OT  Filed 06/09/2024                 [1]   Past Medical History:  Diagnosis Date    Anxiety     B-cell acute lymphoblastic leukemia (ALL)    (CMS-HCC) 10/29/2021    Cytomegalovirus (CMV) viremia    (CMS-HCC)     Diabetes mellitus (CMS-HCC)     Hypertension    [2]   Past Surgical History:  Procedure Laterality Date    BREAST CYST EXCISION Right     age 47    CHEMOTHERAPY      leukemia    IR INSERT PORT AGE GREATER THAN 5 YRS  12/08/2021    IR INSERT PORT AGE GREATER THAN 5 YRS 12/08/2021 Charmaine Arloa Provencal, PA IMG [facility]   [3] History reviewed. No pertinent family history.

## 2024-06-09 NOTE — Plan of Care (Signed)
 Patient AOx4, VSS, afebrile. No falls or acute events this shift. Given IV dilaudid  x1 for CVAD site pain. 20 meq KCL given for electrolyte replacement. Plan of Care reviewed with patient.  Safety measures in place of bed low with brakes locked, non-skid footwear on while out of bed, call bell within reach.       Problem: Adult Inpatient Plan of Care  Goal: Absence of Hospital-Acquired Illness or Injury  Outcome: Shift Focus  Intervention: Identify and Manage Fall Risk  Recent Flowsheet Documentation  Taken 06/08/2024 1930 by Marshal Kung, RN  Safety Interventions:   bleeding precautions   environmental modification   fall reduction program maintained   infection management   isolation precautions   lighting adjusted for tasks/safety   low bed   neutropenic precautions   nonskid shoes/slippers when out of bed  Intervention: Prevent Skin Injury  Recent Flowsheet Documentation  Taken 06/08/2024 1930 by Marshal Kung, RN  Positioning for Skin: Supine/Back  Device Skin Pressure Protection: adhesive use limited  Skin Protection:   adhesive use limited   transparent dressing maintained  Intervention: Prevent Infection  Recent Flowsheet Documentation  Taken 06/08/2024 1930 by Marshal Kung, RN  Infection Prevention:   cohorting utilized   environmental surveillance performed   equipment surfaces disinfected   personal protective equipment utilized   hand hygiene promoted   rest/sleep promoted   single patient room provided   visitors restricted/screened  Goal: Optimal Comfort and Wellbeing  Outcome: Shift Focus  Goal: Readiness for Transition of Care  Outcome: Shift Focus     Problem: Infection  Goal: Absence of Infection Signs and Symptoms  Intervention: Prevent or Manage Infection  Recent Flowsheet Documentation  Taken 06/08/2024 1930 by Marshal Kung, RN  Infection Management: aseptic technique maintained  Isolation Precautions: protective precautions maintained

## 2024-06-10 DIAGNOSIS — C9100 Acute lymphoblastic leukemia not having achieved remission: Principal | ICD-10-CM

## 2024-06-10 LAB — HEPATIC FUNCTION PANEL
ALBUMIN: 3.4 g/dL (ref 3.4–5.0)
ALKALINE PHOSPHATASE: 67 U/L (ref 46–116)
ALT (SGPT): 25 U/L (ref 10–49)
AST (SGOT): 28 U/L (ref <=34)
BILIRUBIN DIRECT: 0.1 mg/dL (ref 0.00–0.30)
BILIRUBIN TOTAL: 0.3 mg/dL (ref 0.3–1.2)
PROTEIN TOTAL: 6.4 g/dL (ref 5.7–8.2)

## 2024-06-10 LAB — LACTATE DEHYDROGENASE: LACTATE DEHYDROGENASE: 265 U/L — ABNORMAL HIGH (ref 120–246)

## 2024-06-10 LAB — CBC W/ AUTO DIFF
BASOPHILS ABSOLUTE COUNT: 0 10*9/L (ref 0.0–0.1)
BASOPHILS ABSOLUTE COUNT: 0 10*9/L (ref 0.0–0.1)
BASOPHILS RELATIVE PERCENT: 0.7 %
BASOPHILS RELATIVE PERCENT: 1 %
EOSINOPHILS ABSOLUTE COUNT: 0.1 10*9/L (ref 0.0–0.5)
EOSINOPHILS ABSOLUTE COUNT: 0.2 10*9/L (ref 0.0–0.5)
EOSINOPHILS RELATIVE PERCENT: 7.5 %
EOSINOPHILS RELATIVE PERCENT: 8.4 %
HEMATOCRIT: 30 % — ABNORMAL LOW (ref 34.0–44.0)
HEMATOCRIT: 30.3 % — ABNORMAL LOW (ref 34.0–44.0)
HEMOGLOBIN: 10.1 g/dL — ABNORMAL LOW (ref 11.3–14.9)
HEMOGLOBIN: 10.2 g/dL — ABNORMAL LOW (ref 11.3–14.9)
LYMPHOCYTES ABSOLUTE COUNT: 0.1 10*9/L — ABNORMAL LOW (ref 1.1–3.6)
LYMPHOCYTES ABSOLUTE COUNT: 0.1 10*9/L — ABNORMAL LOW (ref 1.1–3.6)
LYMPHOCYTES RELATIVE PERCENT: 2.6 %
LYMPHOCYTES RELATIVE PERCENT: 7.8 %
MEAN CORPUSCULAR HEMOGLOBIN CONC: 33.5 g/dL (ref 32.0–36.0)
MEAN CORPUSCULAR HEMOGLOBIN CONC: 33.7 g/dL (ref 32.0–36.0)
MEAN CORPUSCULAR HEMOGLOBIN: 32 pg (ref 25.9–32.4)
MEAN CORPUSCULAR HEMOGLOBIN: 32.1 pg (ref 25.9–32.4)
MEAN CORPUSCULAR VOLUME: 94.9 fL (ref 77.6–95.7)
MEAN CORPUSCULAR VOLUME: 95.9 fL — ABNORMAL HIGH (ref 77.6–95.7)
MEAN PLATELET VOLUME: 10.8 fL — ABNORMAL HIGH (ref 6.8–10.7)
MEAN PLATELET VOLUME: 11 fL — ABNORMAL HIGH (ref 6.8–10.7)
MONOCYTES ABSOLUTE COUNT: 0 10*9/L — ABNORMAL LOW (ref 0.3–0.8)
MONOCYTES ABSOLUTE COUNT: 0.1 10*9/L — ABNORMAL LOW (ref 0.3–0.8)
MONOCYTES RELATIVE PERCENT: 1.8 %
MONOCYTES RELATIVE PERCENT: 3.1 %
NEUTROPHILS ABSOLUTE COUNT: 1.4 10*9/L — ABNORMAL LOW (ref 1.8–7.8)
NEUTROPHILS ABSOLUTE COUNT: 2 10*9/L (ref 1.8–7.8)
NEUTROPHILS RELATIVE PERCENT: 79.7 %
NEUTROPHILS RELATIVE PERCENT: 87.4 %
PLATELET COUNT: 114 10*9/L — ABNORMAL LOW (ref 150–450)
PLATELET COUNT: 90 10*9/L — ABNORMAL LOW (ref 150–450)
RED BLOOD CELL COUNT: 3.13 10*12/L — ABNORMAL LOW (ref 3.95–5.13)
RED BLOOD CELL COUNT: 3.19 10*12/L — ABNORMAL LOW (ref 3.95–5.13)
RED CELL DISTRIBUTION WIDTH: 14.7 % (ref 12.2–15.2)
RED CELL DISTRIBUTION WIDTH: 15.2 % (ref 12.2–15.2)
WBC ADJUSTED: 1.8 10*9/L — ABNORMAL LOW (ref 3.6–11.2)
WBC ADJUSTED: 2.3 10*9/L — ABNORMAL LOW (ref 3.6–11.2)

## 2024-06-10 LAB — EBV QUANTITATIVE PCR, BLOOD: EBV VIRAL LOAD RESULT: NOT DETECTED

## 2024-06-10 LAB — PHOSPHORUS: PHOSPHORUS: 3 mg/dL (ref 2.4–5.1)

## 2024-06-10 LAB — BASIC METABOLIC PANEL
ANION GAP: 11 mmol/L (ref 5–14)
BLOOD UREA NITROGEN: 9 mg/dL (ref 9–23)
BUN / CREAT RATIO: 20
CALCIUM: 9.2 mg/dL (ref 8.7–10.4)
CHLORIDE: 103 mmol/L (ref 98–107)
CO2: 28 mmol/L (ref 20.0–31.0)
CREATININE: 0.44 mg/dL — ABNORMAL LOW (ref 0.55–1.02)
EGFR CKD-EPI (2021) FEMALE: >90 mL/min/1.73m2 (ref >=60)
GLUCOSE RANDOM: 214 mg/dL — ABNORMAL HIGH (ref 70–179)
POTASSIUM: 3.9 mmol/L (ref 3.4–4.8)
SODIUM: 142 mmol/L (ref 135–145)

## 2024-06-10 LAB — PROTIME-INR
INR: 1
PROTIME: 11.4 s (ref 9.9–12.6)

## 2024-06-10 LAB — IGG: GAMMAGLOBULIN; IGG: 767 mg/dL (ref 650–1600)

## 2024-06-10 LAB — MAGNESIUM: MAGNESIUM: 1.9 mg/dL (ref 1.6–2.6)

## 2024-06-10 LAB — IONIZED CALCIUM VENOUS: CALCIUM IONIZED VENOUS (MG/DL): 4.34 mg/dL — ABNORMAL LOW (ref 4.40–5.40)

## 2024-06-10 LAB — APTT
APTT: 30.5 s (ref 24.8–38.4)
HEPARIN CORRELATION: 0.2

## 2024-06-10 LAB — CMV DNA, QUANTITATIVE, PCR: CMV VIRAL LD: NOT DETECTED

## 2024-06-10 MED ADMIN — famotidine (PEPCID) tablet 20 mg: 20 mg | ORAL | @ 02:00:00

## 2024-06-10 MED ADMIN — famotidine (PEPCID) tablet 20 mg: 20 mg | ORAL | @ 14:00:00

## 2024-06-10 MED ADMIN — escitalopram oxalate (LEXAPRO) tablet 10 mg: 10 mg | ORAL | @ 14:00:00

## 2024-06-10 MED ADMIN — losartan (COZAAR) tablet 50 mg: 50 mg | ORAL | @ 14:00:00

## 2024-06-10 MED ADMIN — letermovir (PREVYMIS) tablet 480 mg: 480 mg | ORAL | @ 02:00:00

## 2024-06-10 MED ADMIN — heparin, porcine (PF) 100 unit/mL injection 2 mL: 2 mL | INTRAVENOUS | @ 05:00:00

## 2024-06-10 MED ADMIN — magnesium sulfate 2gm/50mL IVPB: 2 g | INTRAVENOUS | @ 08:00:00

## 2024-06-10 MED ADMIN — prochlorperazine (COMPAZINE) injection 10 mg: 10 mg | INTRAVENOUS | @ 15:00:00

## 2024-06-10 MED ADMIN — prochlorperazine (COMPAZINE) injection 10 mg: 10 mg | INTRAVENOUS

## 2024-06-10 MED ADMIN — lactated ringers bolus 1,000 mL: 1000 mL | INTRAVENOUS | @ 18:00:00 | Stop: 2024-06-10

## 2024-06-10 MED ADMIN — cetirizine (ZYRTEC) tablet 10 mg: 10 mg | ORAL | @ 14:00:00

## 2024-06-10 MED ADMIN — HYDROmorphone (DILAUDID) injection Syrg 0.4 mg: .4 mg | INTRAVENOUS | @ 02:00:00 | Stop: 2024-06-10

## 2024-06-10 MED ADMIN — amoxicillin-clavulanate (AUGMENTIN) 875-125 mg per tablet 1 tablet: 1 | ORAL | @ 14:00:00 | Stop: 2024-06-10

## 2024-06-10 MED ADMIN — amoxicillin-clavulanate (AUGMENTIN) 875-125 mg per tablet 1 tablet: 1 | ORAL | @ 02:00:00 | Stop: 2024-06-10

## 2024-06-10 MED ADMIN — insulin lispro (HumaLOG) injection CORRECTIONAL 0-20 Units: 0-20 [IU] | SUBCUTANEOUS | @ 14:00:00

## 2024-06-10 MED ADMIN — insulin lispro (HumaLOG) injection CORRECTIONAL 0-20 Units: 0-20 [IU] | SUBCUTANEOUS | @ 19:00:00

## 2024-06-10 MED ADMIN — insulin lispro (HumaLOG) injection CORRECTIONAL 0-20 Units: 0-20 [IU] | SUBCUTANEOUS | @ 22:00:00

## 2024-06-10 MED ADMIN — valACYclovir (VALTREX) tablet 500 mg: 500 mg | ORAL | @ 14:00:00

## 2024-06-10 MED ADMIN — valACYclovir (VALTREX) tablet 500 mg: 500 mg | ORAL | @ 02:00:00

## 2024-06-10 MED ADMIN — potassium chloride 20 mEq in 100 mL IVPB Premix: 20 meq | INTRAVENOUS | @ 10:00:00 | Stop: 2025-06-07

## 2024-06-10 MED ADMIN — fludarabine (FLUDARA) 69.25 mg in sodium chloride (NS) 0.9 % 50 mL IVPB: 40 mg/m2 | INTRAVENOUS | @ 15:00:00 | Stop: 2024-06-10

## 2024-06-10 MED ADMIN — tacrolimus (PROGRAF) capsule 3.5 mg: .045 mg/kg | ORAL | @ 02:00:00 | Stop: 2024-06-11

## 2024-06-10 MED ADMIN — tacrolimus (PROGRAF) capsule 3.5 mg: .045 mg/kg | ORAL | @ 14:00:00 | Stop: 2024-06-10

## 2024-06-10 MED ADMIN — insulin glargine (LANTUS) injection BASAL 10 Units: 10 [IU] | SUBCUTANEOUS | @ 15:00:00

## 2024-06-10 NOTE — Consults (Signed)
 PHYSICAL THERAPY  Evaluation (06/10/24 1129)          Patient Name:  Patricia [name]       Medical Record Number: [id number]   Date of Birth: 10-30-1975  Sex: Female        Post-Discharge Physical Therapy Recommendations:  PT Post Acute Discharge Recommendations: 3x weekly, Technical Sales Engineer Recommendation  PT DME Recommendations: None          Treatment Diagnosis: Lower extremity weakness        ASSESSMENT  Problem List: Decreased endurance, Decreased strength, Impaired sensation      Assessment : Patricia [name] presents to acute PT services with impaired LE strength, sensation and endurance. She scored below her age norms for both the 30-second sit to stand and tests and will benefit from ongoing acute and 3x(comm) post acute PT Services to address deficits.      Today's Interventions: PT Eval, functional mobility as noted, pt education re: PT role, POC, activity pacing including RPE goal 2-3/10, importance of mobility, falls precautions, monitoring for s/sx of orthostasis, bleeding precautions in context of exercise.     Personal Factors/Comorbidities Present: 3+ factors   Examination of Body systems: 4+ elements  Clinical Presentation: Evolving    Eval Complexity : Moderate Complexity     Activity Tolerance: Limited by fatigue       PLAN  Planned Frequency of Treatment: Plan of Care Initiated: 06/10/24  1-2x per day Weekly Frequency: 2-3 days per week  Planned Treatment Duration: 06/24/24     Planned Interventions: Education (Patient/Family/Caregiver), Gait training, Home exercise program, Self-care / Home Management training, Therapeutic Exercise, Therapeutic Activity     Goals:   Patient and Family Goals: to remain active and independent     SHORT GOAL #1: Pt will ambulate 500' IND with RPE <4/10.               Time Frame : 2 weeks  SHORT GOAL #2: Pt will negotiate 1 step mod IND.              Time Frame : 2 weeks                                         Long Term Goal #1: Pt will ambulate for 10 minutes with RPE <4/10.  Time Frame: 4 weeks     Prognosis:  Good  Positive Indicators: age, caregiver support  Barriers to Discharge: None     SUBJECTIVE  Communication Preference: Verbal     Patient reports: Pt agreeable to PT, walked 4 laps earlier today and yesterday  Pain Comments: denied  Medical Updates Since Last Visit/Relevant PMH Affecting Clinical Decision Making: Patricia [name] is a 48 y.o. 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 Patricia [name] now presents for a matched unrelated donor stem cell transplant. PMHx also includes cardiomyopathy during chemo, DM2, anxiety, and peripheral neuropathy.     Prior Functional Status: Pt is IND, no use of DME or hx of falls. Reports difficulty negotiating stairs and even small thresholds due to LE weakness- requires UE support on the door frame and leverage against her knee sometimes. 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  Living Situation  Living Environment: House (Plans to DC to [facility] house)  Lives With: Spouse  Home Living: One  level home, Stairs to enter without rails, Walk-in shower, Shower chair without back, Standard height toilet  Number of Stairs to Enter (outside): 1 (threshold)  Caregiver Identified?: Yes (Mom will be primary caregiver)  Caregiver Availability: 24 hours  Caregiver Identified?: Yes (Mom will be primary caregiver)   Equipment available at home: None        Past Medical History[1]         Social History     Tobacco Use    Smoking status: Former     Current packs/day: 0.00     Types: Cigarettes     Quit date: 11/12/2015     Years since quitting: 8.5    Smokeless tobacco: Never   Substance Use Topics    Alcohol use: Not Currently       Past Surgical History[2]          Family History[3]     Allergies: Other, Sulfa (sulfonamide antibiotics), Azithromycin, Erythromycin, Clindamycin, Metoprolol, and Oxycodone                   Objective Findings  Precautions / Restrictions  Precautions: Protective precautions, Bleeding Precautions  Required Braces or Orthoses: Non-applicable     Medical Tests / Procedures: Chart reviewed  Equipment / Environment: Vascular access (PIV, TLC, Port-a-cath, PICC)     Vitals/Orthostatics : VSS per epic, mild SOB and rated RPE 5/10 after     Cognition: WFL  Visual/Perception: Wears Glasses/Contacts all the time  Hearing: No deficit identified     Skin Inspection: Intact where visualized     Upper Extremities  UE Strength: Right WFL, Left WFL    Lower Extremities  LE Strength: Right WFL, Left WFL  LE comment: LE weakness noted during 30second sit to stand- required significant extra time to perform each rep without UE support.          Sensation: Impaired  Motor/Sensory/Neuro Comments: baseline peripheral neuropathy in fingertips, denied changes    Static Sitting-Level of Assistance: Independent  Dynamic Sitting-Level of Assistance: Independent    Static Standing-Level of Assistance: Independent  Dynamic Standing - Level of Assistance: Independent      Bed Mobility: Supine to Sit  Supine to Sit assistance level: Independent     Transfers: Sit to Stand  Sit to Stand assistance level: Modified independent, requires aide device or extra time  Transfer comments: extra time without UE support      Level of Assistance: Independent  Distance Ambulated (ft): 780 ft  Ambulation comments: slow pace but steady     Stairs: n/a      Wheelchair Mobility: n/a     Endurance: Fair, significantly below age norms for (only completed 4 minutes)    Patient at end of session: All needs in reach, On Couch    Physical Therapy Session Duration  PT Individual [mins]: 26            AM-PAC-6 click  Help currently need turning over In bed?: None - Modified Independent/Independent  Help currently needed sitting down/standing up from chair with arms? : None - Modified Independent/Independent  Help currently needed moving from supine to sitting on edge of bed?: None - Modified Independent/Independent  Help currently needed moving to and from bed from wheelchair?: None - Modified Independent/Independent  Help currently needed walking in a hospital room?: None - Modified Independent/Independent  Help currently needed climbing 3-5 steps with railing?: A Little - Minimal/Contact Guard Assist/Supervision    Basic Mobility Score 6 click: 23  6 click Score (in points): % of Functional Impairment, Limitation, Restriction  6: 100% impaired, limited, restricted  7-8: At least 80%, but less than 100% impaired, limited restricted  9-13: At least 60%, but less than 80% impaired, limited restricted  14-19: At least 40%, but less than 60% impaired, limited restricted  20-22: At least 20%, but less than 40% impaired, limited restricted  23: At least 1%, but less than 20% impaired, limited restricted  24: 0% impaired, limited restricted    'AM-PAC' forms are Copyright protected by [employer]       30 Second Sit to [employer] reps (30 sec): 5      Cut-off Scores (Moderately Active Older adults): Criterion fitness standards to maintain physical independence   Age 75-64 34-69 70-74 75-79 14-84 33-89 68-94   Women 15 15 14 13 12 11 9    Men 17 16 15 14 13 11 9              References:  Lilton FABIENE CHARLENA armin Joshua, C. J. (2013). Development and validation of criterion-referenced clinically relevant fitness standards for maintaining physical independence in later years. The Gerontologist 53(2): 255-267.      Simplified 6 MWT (acute)  Indication for Test: Stem Cell Transplant  Total Distance: 750'  Comments: stopped at 4 minutes due to fatigue      Scoring: Normative values (Community-dwelling Elderly).      Age Female Female   60-69 572 meters 538 meters   70-79 527 meters 471 meters   80-89 417 meters 392 meters      Normative values may be used in conjunction with a complete evaluation to interpret the meaning of a patient???s .    References:  Sunnie YEHUDA Ferns TA, Mollinger L. Age- and gender-related test performance in community-dwelling elderly people: Six-Minute Walk Test, Berg Balance Scale, Timed Up & Go Test, and gait speeds. Phys Ther. 2002;82(2):128-137.        I attest that I have reviewed the above information.  Signed: Rosaline JAYSON Shoe, PT  Filed 06/10/2024          [1]   Past Medical History:  Diagnosis Date    Anxiety     B-cell acute lymphoblastic leukemia (ALL)    (CMS-HCC) 10/29/2021    Cytomegalovirus (CMV) viremia    (CMS-HCC)     Diabetes mellitus (CMS-HCC)     Hypertension    [2]   Past Surgical History:  Procedure Laterality Date    BREAST CYST EXCISION Right     age 39    CHEMOTHERAPY      leukemia    IR INSERT PORT AGE GREATER THAN 5 YRS  12/08/2021    IR INSERT PORT AGE GREATER THAN 5 YRS 12/08/2021 Charmaine Arloa Provencal, PA IMG VIR [facility]   [3] History reviewed. No pertinent family history.

## 2024-06-10 NOTE — Consults (Signed)
 Recreational Therapy Evaluation  06/10/2024    Patient Name:  Patricia [name]       Medical Record Number: [id number]   Date of Birth: 06-06-1976  Sex: Female          Room/Bed:  1121/1121-01    Session Duration  Individual [mins]: 16 Min.    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 Pt oriented to RecT services with completion of initial RecT Eval on 06/10/24 with review of pt???s goals for admission, preferred coping mechanisms, activity engagement, and adjustment to hospitalization. Pt exhibits a pleasant mood but feels nervous about the upcoming transplant. Due to her medical history, she is familiar with the treatment course and hospital environment, which provides a sense of relief. She has a strong support system from her husband, friends, parents, and in-laws. 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 Her husband motivates her during the treatment course, as she desires for him to live a normal life after her hospitalization. Her goals during hospitalization include staying active and maintaining her fine motor skills. She is equipped with a lap counter and has already completed eight laps. To aid in coping, she uses her laptop, coloring books, sketch pads, and books. She expressed interest in completing a ceiling tile in future RecT sessions. LRT validated her feelings and provided reassurance. Pt was informed about RecT services and opportunities available throughout her admission. She is receptive to RecT services. LRT to follow up to facilitate healthy coping skills and promote social interaction.    Plan of Care  Plan of Care Initiated: 06/10/24  1x per day Weekly Frequency: 3-4x week   Planned Treatment Duration: 07/08/24    Motivators: Able to Identify motivators  Patient's Motivators: My husband. I want to try to get through this so he can get back to his normal life.  Patient's Identified Treatment Goal: Staying active and maintaining my fine motor skills.  Patient's Stressors / Triggers: Unknowns of tx course and prolonged hospitalization.  Treatment Plan developed in collaboration with: Patient, Treatment Team  Interventions: Adjustment to hospitalization, Health Care Encounters and Medical Condition, Coping skills, Expressive arts, Games, Leisure, Promoting social interaction, Wellness / recovery, Stress management, Relaxation training     Goals:  LTG 1.0: Pt will utilize recreational therapy activities to increase coping ability and structure per 4 weeks. STG 1.1: Pt will choose and engage in 2 out of 4 activities as offered by therapist to increase coping ability, structure, and support x2 a week per 2 weeks.     LTG 2.0: Pt will increase social skills per 4 weeks. STG 2.1: Pt will verbalize with therapist for a minimum of 15 mins per sessions as supervised by therapist to decrease isolation x2 a week per 2 weeks.    Interventions: Adjustment to hospitalization, Health Care Encounters and Medical Condition, Coping skills, Expressive arts, Games, Leisure, Promoting social interaction, Wellness / recovery, Stress management, Relaxation training     Subjective    Current Situation: Pt lying in bed upon arrival of LRT.  Cognitive, Emotional, Physical, Social, and Leisure/Life functioning were assessed:: Patient Interviews, Review of Chart, Treatment Team, No family present  Living Environment: House  Lives With: Spouse  Risk Factors: None  Precautions: Bleeding Precautions, Isolation precautions  Equipment / Environment: Vascular access (PIV, TLC, Port-a-cath, PICC)  Reports/displays signs/symptoms of pain?: No  Add'l Session Information: No family/caregiver present, RN aware of RT tx session    Past Medical History[1] Social History     Tobacco Use    Smoking status: Former     Current packs/day: 0.00     Types: Cigarettes     Quit date: 11/12/2015     Years since quitting: 8.5    Smokeless tobacco: Never   Substance Use Topics    Alcohol use: Not Currently      Past Surgical History[2] Family History[3]     Allergies: Other, Sulfa (sulfonamide antibiotics), Azithromycin, Erythromycin, Clindamycin, Metoprolol, and Oxycodone      Objective    Cognitive  Stage of change / level of insight: Pre-contemplation  Thought Process/Content: Intact  Judgment: Intact  Memory: Independent with recall  Attention Span/Alertness : Able to attend to RT assessment  Medical understanding: Would benefit from additional dx/tx education', Able to explain treatments/procedures related to medical condition, Knows name of diagnosis and/or medical equipment, Understands long term implications of diagnosis and/or treatment  Orientation: Fully oriented    Communication  Communication Barriers: None noted    Emotional  Mood: Pleasant  Affect: Congruent  Anxiety Management : Reports anxiety that is situational  Pain Management : Reports pain that is situational  Frustration Tolerance: Reports frustration that is situational  Coping Skills : Requires resources/assistance to utilize coping strategies, Reports independent practice of healthy coping strategies  Motivated to learn new coping strategies?: Yes  Adjustment: Willing to learn ways to compensate  Body Image/Self concept: No concern with body image  Emotional Expression: Independently expresses feelings    Physical Domain  Vision: Wears glasses all the time  Upper extremity function: Refer to OT/PT assessment.  Lower extremity function: Refer to OT/PT assessment.  Exercise readiness comment: Not assessed.    Social  Support system: Reports positive support system (Husband, parents, in laws, bestfriends, etc.)  Patient Behaviors and Interactions: Appropriate  Ability to form relationship / interact with others: Able to form social relationships independently, Requires supervision/ cues     Leisure and Life Function  Level of involvement: Frequent participation  Community reintegration / barrier education: Unable to return to community at this time  Actor for Leisure and life functioning: Requires min cues to investigate adaptations  Motivation to engage in leisure / play: Yes  Quality of participation: Satisfactory involvement in healthy leisure pursuits, Involved in healthy leisure though dissatisfied with barriers  Additional Leisure and Life Function Comments: Pt engages in reading, arts & crafts (coloring books/sketching), working with non-profit agency during her free time.    I attest that I have reviewed the above information.  Signed by Nelwyn CHRISTELLA Bible, LRT/CTRS   Filed 06/10/2024         [1]   Past Medical History:  Diagnosis Date    Anxiety     B-cell acute lymphoblastic leukemia (ALL)    (CMS-HCC) 10/29/2021    Cytomegalovirus (CMV) viremia    (CMS-HCC)     Diabetes mellitus (CMS-HCC)     Hypertension    [2]   Past Surgical History:  Procedure Laterality Date    BREAST CYST EXCISION Right     age 89    CHEMOTHERAPY      leukemia    IR INSERT PORT AGE GREATER THAN 5 YRS  12/08/2021    IR INSERT PORT AGE GREATER THAN 5  YRS 12/08/2021 Charmaine Arloa Provencal, PA IMG [facility]   [3] History reviewed. No pertinent family history.

## 2024-06-10 NOTE — Consults (Signed)
 Care Management  Initial Transition Planning Assessment  Patient lives with spouse in  [place], [place], in a 1 level home with 1 steps to enter.  At baseline patient is independent with ADLs . She does use shower chair and glucometer as DME. Family will drive patient to [facility] house and assist with basic care.           Type of Residence: Mailing Address:  Patricia [name] [address] [place] [address]  Contacts:    Patient Phone Number: [phone] (mobile)        Medical Provider(s): Estelle Toribio SAUNDERS, MD  Reason for Admission: Admitting Diagnosis:  Allo SCT for ALL  Past Medical History:   has a past medical history of Anxiety, B-cell acute lymphoblastic leukemia (ALL)    (CMS-HCC) (10/29/2021), Cytomegalovirus (CMV) viremia    (CMS-HCC), Diabetes mellitus (CMS-HCC), and Hypertension.  Past Surgical History:   has a past surgical history that includes IR Insert Port Age Greater Than 5 Years (12/08/2021); Breast cyst excision (Right); and Chemotherapy.   Previous admit date: 01/25/2024    Primary Insurance- Payor: STATE HEALTH ANIMATOR / Plan: STATE HEALTH PLAN AETNA / Product Type: *No Product type* /   Secondary Insurance - None  Prescription Coverage - State health plan    Preferred Pharmacy - [facility] [phone] - [place], [place] - [address] AT [facility] OF [address] [facility]  [facility]    Transportation home: Musician / Social Worker assessed the patient by : In person interview with patient  Orientation Level: Oriented X4  Functional level prior to admission: Independent  Reason for referral: Discharge Planning    Contact/Decision Maker  Extended Emergency Contact Information  Primary Emergency Contact: [name] Asa  Address: [address]           [place], [place] [address] [place]  Mobile Phone: [phone]  Relation: Spouse  Secondary Emergency Contact: Czerwonka,Jeanie  Address: [address]           [place], [place] [address] [place] Phone: [phone]  Relation: Mother  Father: Prue, Lingenfelter  Address: [address]           [place], [place] [address] [place] [employer] Phone: [phone]    Legal Next of Kin / Guardian / POA / Advance Directives     HCDM (patient stated preference): [name] Asa GLENWOOD Alfreda - [phone]    HCDM, back-up (If primary HCDM is unavailable): Heidelberg,Jeanie - Mother - [phone]    Advance Directive (Medical Treatment)  Does patient have an advance directive covering medical treatment?: Patient has advance directive covering medical treatment, copy in chart.  Reason patient does not have an advance directive covering medical treatment:: Patient needs follow-up to complete one.    Health Care Decision Maker [HCDM] (Medical & Mental Health Treatment)  Healthcare Decision Maker: HCDM documented in the HCDM/Contact Info section.  Information offered on HCDM, Medical & Mental Health advance directives:: Patient given information.    Advance Directive (Mental Health Treatment)  Does patient have an advance directive covering mental health treatment?: Patient has advance directive covering mental health treatment, copy in chart.  Reason patient does not have an advance directive covering mental health treatment:: HCDM documented in the HCDM/Contact Info section.    Readmission Information  Have you been hospitalized in the last 30 days?: No       Did the following happen with your discharge?       Patient Information  Lives with: Spouse/significant other    Type of Residence: Private residence        Location/Detail: 1 story with 1 step to enter    Support Systems/Concerns: Spouse    Responsibilities/Dependents at home?: No    Home Care services in place prior to admission?: No      Equipment Currently Used at Home: shower chair, glucometer       Currently receiving outpatient dialysis?: N/A       Financial Information       Need for financial assistance?: No       Social Drivers of Health  Social Drivers of Health     Food Insecurity: No Food Insecurity (06/10/2024)    Hunger Vital Sign     Worried About Running Out of Food in the Last Year: Never true     Ran Out of Food in the Last Year: Never true   Tobacco Use: Medium Risk (06/07/2024)    Patient History     Smoking Tobacco Use: Former     Smokeless Tobacco Use: Never     Passive Exposure: Not on file   Transportation Needs: No Transportation Needs (06/10/2024)    PRAPARE - Therapist, Art (Medical): No     Lack of Transportation (Non-Medical): No   Alcohol Use: Not on file   Housing: Low Risk (06/10/2024)    Housing     Within the past 12 months, have you ever stayed: outside, in a car, in a tent, in an overnight shelter, or temporarily in someone else's home (i.e. couch-surfing)?: No     Are you worried about losing your housing?: No   Physical Activity: Not on file   Utilities: Low Risk (06/10/2024)    Utilities     Within the past 12 months, have you been unable to get utilities (heat, electricity) when it was really needed?: No   Stress: Not on file   Interpersonal Safety: Not At Risk (06/07/2024)    Interpersonal Safety     Unsafe Where You Currently Live: No     Physically Hurt by Anyone: No     Abused by Anyone: No   Substance Use: Not on file (06/05/2023)   Intimate Partner Violence: Not At Risk (11/24/2023)    Humiliation, Afraid, Rape, and Kick questionnaire     Fear of Current or Ex-Partner: No     Emotionally Abused: No     Physically Abused: No     Sexually Abused: No   Social Connections: Not on file   Financial Resource Strain: Low Risk (06/10/2024)    Overall Financial Resource Strain (CARDIA)     Difficulty of Paying Living Expenses: Not hard at all   Health Literacy: Not on file   Internet Connectivity: Not on file       Complex Discharge Information    Is patient identified as a difficult/complex discharge?: No  Interventions:       Discharge Needs Assessment  Concerns to be Addressed: other (see comments) (CM will follow for any discharge needs.)    Clinical Risk Factors: Principal Diagnosis: Cancer, Stroke, COPD, Heart Failure, AMI, Pneumonia, Joint Replacment      Prior overnight hospital stay or ED visit in last 90 days: No      Anticipated Changes  Related to Illness: other (see comments) (CM will follow for any discharge needs.)    Equipment Needed After Discharge: other (see comments) (CM to follow for discharge DME needs)    Discharge Facility/Level of Care Needs: other (see comments) (CM will follow for any discharge needs.)    Readmission  Risk of Unplanned Readmission Score: UNPLANNED READMISSION SCORE: 31.88%  Predictive Model Details          32% (High)  Factor Value    Calculated 06/10/2024 12:08 26% Number of active inpatient medication orders 53    [place] Risk of Unplanned Readmission Model 8% Diagnosis of cancer present     7% Number of hospitalizations in last year 2     7% Active antipsychotic inpatient medication order present     7% ECG/EKG order present in last 6 months     6% Encounter of ten days or longer in last year present     5% Diagnosis of electrolyte disorder present     5% Imaging order present in last 6 months     4% Latest hemoglobin low (10.2 g/dL)     4% Phosphorous result present     4% Charlson Comorbidity Index 5     4% Number of ED visits in last six months 1     3% Active anticoagulant inpatient medication order present     3% Active corticosteroid inpatient medication order present     3% Age 48     2% Current length of stay 3.094 days     1% Future appointment scheduled     1% Active ulcer inpatient medication order present      Readmitted Within the Last 30 Days? (No if blank)   Patient at risk for readmission?: Yes    Discharge Plan  Screen findings are: Care Manager reviewed the plan of the patient's care with the Multidisciplinary Team. No discharge planning needs identified at this time. Care Manager will continue to manage plan and monitor patient's progress with the team.    Expected Discharge Date: 07/02/2024    Expected Transfer from Critical Care:      Quality data for continuing care services shared with patient and/or representative?: Yes  Patient and/or family were provided with choice of facilities / services that are available and appropriate to meet post hospital care needs?: Other (Comment) (Choice of facilities/services will be provided if deemed medically necessary.)       Initial Assessment complete?: Yes

## 2024-06-10 NOTE — Plan of Care (Signed)
 Day -2. Vitals signs: remained WNL this shift. Pain: pt received prn iv dilaudid  x1 for back pain and pain at CVAD site. Nausea/vomitting: prn IV compazine  given x1. Diarrhea: denies. Blood/electrolyte replacements: 20meq potassium and 2gm magnesium  given per order parameters. Other: Tolerated scheduled medications, no falls or injuries, neutropenic precautions maintained. Pt educated on importance of increased oral fluid intake and on importance of continuous fluids. Pt continues to refuse continuous fluids. Pt resting in bed quietly at this time.      Shift Summary  Severe pain at multiple sites was managed with HYDROmorphone , resulting in significant pain reduction and improved comfort.   Safety interventions were consistently maintained, and no new injuries or hospital-acquired conditions were noted.   Temperature and sepsis risk scores remained stable, and no new infection-related symptoms were documented.   PRN magnesium  sulfate and potassium chloride  were administered in response to lab values.   Overall, comfort improved and safety was maintained throughout the shift.    Absence of Hospital-Acquired Illness or Injury: Safety measures such as bed in lowest position, wheels locked, and call light within reach were maintained throughout the shift; hourly visual checks confirmed patient remained in bed and awake, and no new skin abnormalities were documented aside from pre-existing bruising.    Optimal Comfort and Wellbeing: Pain in the back, shoulder, and CVAD site was reported as severe at the start of the shift but decreased to zero after HYDROmorphone  administration, with patient able to rest comfortably thereafter.    Absence of Infection Signs and Symptoms: Temperature remained stable and within normal range, and sepsis risk scores stayed low and unchanged throughout the shift; no new symptoms were documented.

## 2024-06-10 NOTE — Progress Notes (Signed)
 Informed Consent Documentation  Study: [id number]     I met with patient Patricia [name]  to discuss consent for the [id number] trial. The protocol was reviewed including discussion of risks & benefits, confidentiality, time commitments involved, study contact list, and the option to withdraw at any time. Alternatives to study participation were discussed. The patient was given reasonable time to consider participation in the study, in the absence of coercion or undue influence. Patient was offered an opportunity for questions and these questions were answered. Patient verbalized understanding of information presented.      The patient has signed the study informed consent (contains HIPAA Authorization) as follows:   X     In my presence  A copy of the signed informed consent was given to the patient. The informed consent will be placed in the regulatory files at the [facility] as well as scanned into [employer]. Every effort to maintain confidentiality will be employed.    Date: 06/10/2024            Plan:   Patient to take part will have sample collection of tissue, including but not limited to bone marrow and lymph node biopsies, blood and serum, normal cellular DNA, and stool. The collection is of excess body fluids following completion of routine laboratory tests. Blood sample collection will be up to 100 ml of blood. Excess bone marrow aspirate material (up to 15 mL) will be obtained.    Inclusion Criteria  All patients between the ages of 25 and older being evaluated by the hematology and/or oncology service(s) in the [facility] hospital(s) are eligible. YES

## 2024-06-10 NOTE — Plan of Care (Signed)
 Pt day -2 from allo SCT. VSS, pt afebrile throughout shift. Pt received PRN IV compazine  x1 for nausea. Pt given 1000 mL LR bolus per order. Pt free from fall/injury throughout shift, call bell in reach.      Problem: Adult Inpatient Plan of Care  Goal: Absence of Hospital-Acquired Illness or Injury  Outcome: Shift Focus  Intervention: Identify and Manage Fall Risk  Recent Flowsheet Documentation  Taken 06/10/2024 0730 by Cristopher Ernst, RN  Safety Interventions:   bleeding precautions   chemotherapeutic agent precautions   environmental modification   fall reduction program maintained   infection management   isolation precautions   lighting adjusted for tasks/safety   low bed   neutropenic precautions   nonskid shoes/slippers when out of bed  Intervention: Prevent Skin Injury  Recent Flowsheet Documentation  Taken 06/10/2024 0730 by Cristopher Ernst, RN  Positioning for Skin: Supine/Back  Device Skin Pressure Protection: adhesive use limited  Skin Protection: adhesive use limited  Intervention: Prevent Infection  Recent Flowsheet Documentation  Taken 06/10/2024 0730 by Cristopher Ernst, RN  Infection Prevention:   cohorting utilized   environmental surveillance performed   equipment surfaces disinfected   hand hygiene promoted   personal protective equipment utilized   rest/sleep promoted   single patient room provided   visitors restricted/screened  Goal: Optimal Comfort and Wellbeing  Outcome: Shift Focus     Problem: Infection  Goal: Absence of Infection Signs and Symptoms  Outcome: Shift Focus  Intervention: Prevent or Manage Infection  Recent Flowsheet Documentation  Taken 06/10/2024 0730 by Cristopher Ernst, RN  Infection Management: aseptic technique maintained  Isolation Precautions: protective precautions maintained

## 2024-06-10 NOTE — Progress Notes (Signed)
 BONE MARROW TRANSPLANT AND CELLULAR THERAPY PROGRESS NOTE    [name]  MRN: [id number]  Encounter Date: 06/10/24    Referring physician:  Dr. Toribio Bunker  BMT Attending MD: Dr. Jeaneen    Disease:Ph+B ALL  Current disease status: CR MRD Negative  Type of Transplant: RIC MUD Allo  Graft Source: Fresh PBSCs  Transplant Day: -2     Interval History:  Patricia [name] is a 48 y.o. 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 Mercie now presents for a matched unrelated donor stem cell transplant.        - [name]. Afebrile. VSS.  - Continues to have intermittent nausea controlled with PRN Compazine .   - Reports that line insertion pain is improving. Received one dose of Dilaudid  PRN in the last 24 hours.   - Recorded 1.5L PO intake in the last 24h. Encouraged to increase PO intake. Supplement with 1L LR today.      Review of Systems:  A full system review was performed and was negative except as noted in the above interval history.    Temp:  [36.4 ??C (97.5 ??F)-36.6 ??C (97.9 ??F)] 36.5 ??C (97.7 ??F)  Pulse:  [78-101] 101  Resp:  [16-18] 18  BP: (108-136)/(74-99) 136/99  MAP (mmHg):  [85-112] 112  SpO2:  [95 %-98 %] 97 %    I/O last 3 completed shifts:  In: 3420 [P.O.:3420]  Out: 6250 [Urine:6250]  I/O this shift:  In: 580 [P.O.:500; I.V.:80]  Out: 1070 [Urine:1070]    Last 5 Recorded Weights    06/07/24 1129 06/08/24 0630 06/08/24 2009 06/09/24 2023   Weight: 79.2 kg (174 lb 11.2 oz) 79.7 kg (175 lb 11.2 oz) 80.4 kg (177 lb 4.8 oz) 78.4 kg (172 lb 12.8 oz)     Weight change: -2.041 kg (-4 lb 8 oz)    Test Results:   Reviewed in EPIC. Abnormal values discussed below.     Scheduled Meds:Scheduled Medications[1]  Continuous Infusions:Infusions Meds[2]  PRN Meds:.PRN Medications[3]    Physical Exam: no change from 06/09/2024  General : No acute distress noted.   Central venous access: Line clean, dry, intact. No erythema or drainage noted.   ENT: Moist mucous membranes. Oropharhynx without lesions, erythema or exudate.   Cardiovascular: Pulse normal rate, regularity and rhythm. S1 and S2 normal, without any murmur, rub, or gallop.  Lungs: Clear to auscultation bilaterally, without wheezes/crackles/rhonchi. Good air movement.   Skin: Warm, dry, intact. No rash noted.   Psychiatry: Alert and oriented to person, place, and time.   Gastrointestinal: Normoactive bowel sounds, abdomen soft, non-tender   Musculoskeletal/Extremities: Full range of motion in shoulder, elbow, hip knee, ankle, wrists and feet. No edema.   Neurologic: CNII-XII intact. Normal strength and sensation throughout    Assessment/Plan:    BMT: ALL   CR MRD Negative  HCT-CI (age adjusted) 89 (age, anxiety, reduced DLCO, HF (reduced EF), T2DM, CMV viremia)  Conditioning:.  1. Fludarabine 40 mg/m2 days -5, -4, -3, -2  2. Melphalan 100  mg/m2 day -1      Donor: 10/10, Female, ABO O-, CMV negative  Engraftment: G-CSF starting Day + 5 through WBC recovery (as defined as ANC 1.0 x 2 days or 3.0 x 1 day)  -Date of last G-CSF injection: TBD     GVHD prophylaxis:   1.Tacrolimus starting on day -3 (goal 5-10 ng/mL)  2. Methotrexate  5 mg/m2 IVP on days +1, +3, +6 and +11  3.  ATG  per [facility] standard dosing will not be included     Hem: Transfusion criteria: Transfuse 1 unit of PRBCs for hemoglobin <7 and 1 unit of platelets for Plt <10K or bleeding. [name] [name] does not have a history of transfusion reactions. Consent was obtained and documented on 06/05/24.    ID:   Prophylaxis:  -Antiviral: Valtrex  500 mg po BID started on admission and continue for 2 year      Letermovir  indicated as R/CMV + D/CMV - and with prior hx of CMV viremia, plan to continue on admission through at least day +200  -Antifungal: Fluconazole  400 mg po daily to start day 0 thorugh day +75  -Antibacterial: Levaquin  500 mg po daily to start day 0, continue while neutropenic  -PJP: Sulfa allergic. Dapsone  daily upon platelet engraftment, has tolerated this well in the past. Toxo PCR negative. -11/7 stopped Dapsone  upon admission, restart with platelet engraftment.      Patient was screened for upper respiratory symptoms, including S/S of Covid-19. bmt prescreen symptoms: Patient reports upper respiratory symptoms.  bmtu rpp testing: RPP to be ordered and performed ahead of BMTU admission. Patient educated on continued need to inform medical team of any new symptoms.     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   Previously required Valcyte  treatment  -Continue Letermovir  and Valtrex  BID through day +200 post SCT.      Recurrent Acute Sinusitis  Recurrent acute sinusitis with nasal congestion, frontal headache, and low-grade fever. Responded well to Augmentin .  -11/7 current sx resolving, started Augmentin  10 days (10/30-11/9)     Hypogammaglobulinemia  Monthly IVIG - last on 05/16/2024  - 11/7 check weekly IgG levels     GI:   GERD prophylaxis:  -Pepcid  BID     Nausea:  -Anti-emetics per BMT protocol.      Diarrhea:  -Antidiarrheals per BMT protocol    Renal: Creatinine normal. No issues.      FEN:    Electrolyte replacement per protocol    Hepatic: Normal bilirubin. SOS prophylaxis not indicated with RIC regimen.     CV: Followed by [facility] Cardiology: Heart failure with mildly reduced ejection fraction (HFmrEF)   Hx of EF 45-50%, most recent Echo 05/23/24 improved to 55-60%  Hx of prolonged QTc, last EKG 489 follow closely with addition of QTc prolonging medications  -11/7 QTc 457. Check weekly EKGs (next on 11/14), added cardiac lytes   -11/8: 40 meq of K  -11/9: 20 meq of K     Hypertension: Home med Losartan  50 mg daily.     Pulm: No pulmonary symptoms currently. Reduced DLCO      Endocrine:   Menses suppression: Lupron  last 07/26/22.  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 At home uses Dexcom for monitoring.  -11/7 ACHS and SSI while inpatient. Lantus  25 units in AM. Can consider using Dexcom next week if numbers similar to hospital glucometer results   -11/8: holding Lantus  due to morning POC 89  - 11/9: Blood glucose range 131 -189, resumed Lantus  at lower dose 10 Units    Neuro/Pain:  - PRN tramadol  50 mg added. No oxy as this give patient migraines   - 11/8: Tramadol  was not helping, Morphine  with mild relief. Ordered dilaudid  0.5mg  q8 prn x 2 days for severe pain, removed tramadol  prn    Psych: Followed by Palliative Care     Anxiety:  Home med: Lexapro  10 mg daily   Reconnected with her local therapist   - If pt continues to endorse anxiety, may increase Lexapro  to 20 mg daily  - 11/7 added PRN ativan  0.5 mg Q8     Caregivers: Lives in [place], [place]  Primary caregiver: Jeanie Junio (mom)- first 30 days at [facility] then home if doing well will stay longer if medically needed  Secondary caregiver: Devaughn Ranger (husband)     Consults: While inpatient, consider consults to music therapy, integrative medicine and massage therapy.     Arriyah Madej Jo-an Elva Levy, ACNP  [place] [facility]         [1]    cetirizine   10 mg Oral Daily    [START ON 06/11/2024] dexAMETHasone   12 mg Oral Once    escitalopram  oxalate  10 mg Oral Daily    famotidine   20 mg Oral BID    [START ON 06/12/2024] fluconazole   400 mg Oral Daily    [START ON 06/11/2024] fosaprepitant  150 mg Intravenous Once    heparin , porcine (PF)  2 mL Intravenous Mon,Wed,Fri    insulin  glargine  10 Units Subcutaneous Daily    insulin  lispro  0-20 Units Subcutaneous TID AC    lactated ringers   1,000 mL Intravenous Once    letermovir   480 mg Oral Nightly    [START ON 06/12/2024] levoFLOXacin   500 mg Oral Daily    losartan   50 mg Oral Daily    [START ON 06/11/2024] melphalan  100 mg/m2 (Treatment Plan Adjusted) Intravenous Once    [START ON 06/11/2024] ondansetron   24 mg Oral Once    [START ON 06/11/2024] tacrolimus  0.03 mg/kg (Treatment Plan Recorded) Oral BID    tacrolimus  0.045 mg/kg (Treatment Plan Recorded) Oral BID    valACYclovir   500 mg Oral BID [2]    IP okay to treat      sodium chloride  20 mL/hr (06/07/24 1326)   [3] albuterol , aluminum-magnesium  hydroxide-simethicone, CETAPHIL, dexAMETHasone , dextrose  in water, diphenhydrAMINE , emollient combination no.92, EPINEPHrine IM, famotidine  (PEPCID ) IV, glucagon , glucose, IP okay to treat, lidocaine , lidocaine , loperamide , loperamide , LORazepam , magnesium  sulfate, methylPREDNISolone  sodium succinate , potassium chloride  in water, prochlorperazine  **OR** prochlorperazine , sodium chloride  0.9%

## 2024-06-11 LAB — BASIC METABOLIC PANEL
ANION GAP: 10 mmol/L (ref 5–14)
BLOOD UREA NITROGEN: 9 mg/dL (ref 9–23)
BUN / CREAT RATIO: 18
CALCIUM: 9 mg/dL (ref 8.7–10.4)
CHLORIDE: 102 mmol/L (ref 98–107)
CO2: 27 mmol/L (ref 20.0–31.0)
CREATININE: 0.51 mg/dL — ABNORMAL LOW (ref 0.55–1.02)
EGFR CKD-EPI (2021) FEMALE: >90 mL/min/1.73m2 (ref >=60)
GLUCOSE RANDOM: 360 mg/dL — ABNORMAL HIGH (ref 70–179)
POTASSIUM: 4.3 mmol/L (ref 3.4–4.8)
SODIUM: 139 mmol/L (ref 135–145)

## 2024-06-11 LAB — MAGNESIUM: MAGNESIUM: 1.6 mg/dL (ref 1.6–2.6)

## 2024-06-11 LAB — TACROLIMUS LEVEL, TROUGH: TACROLIMUS, TROUGH: 16.1 ng/mL — ABNORMAL HIGH (ref 5.0–15.0)

## 2024-06-11 MED ADMIN — famotidine (PEPCID) tablet 20 mg: 20 mg | ORAL | @ 02:00:00

## 2024-06-11 MED ADMIN — famotidine (PEPCID) tablet 20 mg: 20 mg | ORAL | @ 15:00:00

## 2024-06-11 MED ADMIN — escitalopram oxalate (LEXAPRO) tablet 10 mg: 10 mg | ORAL | @ 15:00:00

## 2024-06-11 MED ADMIN — losartan (COZAAR) tablet 50 mg: 50 mg | ORAL | @ 15:00:00

## 2024-06-11 MED ADMIN — letermovir (PREVYMIS) tablet 480 mg: 480 mg | ORAL | @ 02:00:00

## 2024-06-11 MED ADMIN — magnesium sulfate 2gm/50mL IVPB: 2 g | INTRAVENOUS | @ 11:00:00

## 2024-06-11 MED ADMIN — magnesium sulfate 2gm/50mL IVPB: 2 g | INTRAVENOUS | @ 09:00:00

## 2024-06-11 MED ADMIN — prochlorperazine (COMPAZINE) injection 10 mg: 10 mg | INTRAVENOUS | @ 18:00:00

## 2024-06-11 MED ADMIN — cetirizine (ZYRTEC) tablet 10 mg: 10 mg | ORAL | @ 15:00:00

## 2024-06-11 MED ADMIN — insulin lispro (HumaLOG) injection CORRECTIONAL 0-20 Units: 0-20 [IU] | SUBCUTANEOUS | @ 18:00:00

## 2024-06-11 MED ADMIN — insulin lispro (HumaLOG) injection CORRECTIONAL 0-20 Units: 0-20 [IU] | SUBCUTANEOUS | @ 14:00:00

## 2024-06-11 MED ADMIN — insulin lispro (HumaLOG) injection CORRECTIONAL 0-20 Units: 0-20 [IU] | SUBCUTANEOUS | @ 23:00:00

## 2024-06-11 MED ADMIN — valACYclovir (VALTREX) tablet 500 mg: 500 mg | ORAL | @ 02:00:00

## 2024-06-11 MED ADMIN — valACYclovir (VALTREX) tablet 500 mg: 500 mg | ORAL | @ 15:00:00

## 2024-06-11 MED ADMIN — insulin lispro (HumaLOG) injection 12 Units: 12 [IU] | SUBCUTANEOUS | @ 19:00:00 | Stop: 2024-06-11

## 2024-06-11 MED ADMIN — tacrolimus (PROGRAF) capsule 3.5 mg: .045 mg/kg | ORAL | @ 02:00:00 | Stop: 2024-06-10

## 2024-06-11 MED ADMIN — HYDROmorphone (DILAUDID) injection Syrg 0.4 mg: .4 mg | INTRAVENOUS | @ 20:00:00 | Stop: 2024-06-12

## 2024-06-11 MED ADMIN — HYDROmorphone (DILAUDID) injection Syrg 0.4 mg: .4 mg | INTRAVENOUS | @ 02:00:00 | Stop: 2024-06-12

## 2024-06-11 MED ADMIN — HYDROmorphone (DILAUDID) injection Syrg 0.4 mg: .4 mg | INTRAVENOUS | @ 11:00:00 | Stop: 2024-06-12

## 2024-06-11 MED ADMIN — melphalan HCl (ALKERAN) IVPB 173 mg: 100 mg/m2 | INTRAVENOUS | @ 16:00:00 | Stop: 2024-06-11

## 2024-06-11 MED ADMIN — insulin lispro (HumaLOG) injection 6 Units: 6 [IU] | SUBCUTANEOUS | @ 18:00:00 | Stop: 2024-06-11

## 2024-06-11 MED ADMIN — fosaprepitant (EMEND) 150 mg in sodium chloride 0.9 % 100 mL IVPB: 150 mg | INTRAVENOUS | @ 15:00:00 | Stop: 2024-06-11

## 2024-06-11 MED ADMIN — insulin lispro (HumaLOG) injection 6 Units: 6 [IU] | SUBCUTANEOUS | Stop: 2024-06-11

## 2024-06-11 MED ADMIN — ondansetron (ZOFRAN) tablet 24 mg: 24 mg | ORAL | @ 14:00:00 | Stop: 2024-06-11

## 2024-06-11 MED ADMIN — insulin glargine (LANTUS) injection BASAL 15 Units: 15 [IU] | SUBCUTANEOUS | @ 18:00:00 | Stop: 2024-06-11

## 2024-06-11 MED ADMIN — dexAMETHasone (DECADRON) tablet 12 mg: 12 mg | ORAL | @ 14:00:00 | Stop: 2024-06-11

## 2024-06-11 MED ADMIN — tacrolimus (PROGRAF) capsule 2.5 mg: .03 mg/kg | ORAL | @ 15:00:00 | Stop: 2024-06-11

## 2024-06-11 NOTE — Plan of Care (Signed)
 Pt day -1 from allo SCT. VSS, afebrile this shift. PRNs given include IV compazine  x1 for nausea and dilaudid  x1 for pain. Two one-time doses of correctional insulin  given for hyperglycemia. Provider increased lantus  dose. Pt educated by nursing staff on the importance of oral cryo therapy during melphalan infusion. Pt encouraged throughout two hours to continue chewing on ice. Light participation by pt despite reinforcement. Encouraged PO intake. Much better recorded PO intake by pt throughout shift. Safety maintained.       Problem: Adult Inpatient Plan of Care  Goal: Absence of Hospital-Acquired Illness or Injury  Outcome: Shift Focus  Intervention: Identify and Manage Fall Risk  Recent Flowsheet Documentation  Taken 06/11/2024 0718 by Cristopher Ernst, RN  Safety Interventions:   bleeding precautions   chemotherapeutic agent precautions   environmental modification   fall reduction program maintained   infection management   isolation precautions   lighting adjusted for tasks/safety   low bed   neutropenic precautions   nonskid shoes/slippers when out of bed  Intervention: Prevent Infection  Recent Flowsheet Documentation  Taken 06/11/2024 0718 by Cristopher Ernst, RN  Infection Prevention:   cohorting utilized   environmental surveillance performed   equipment surfaces disinfected   hand hygiene promoted   personal protective equipment utilized   rest/sleep promoted   single patient room provided   visitors restricted/screened  Goal: Optimal Comfort and Wellbeing  Outcome: Shift Focus     Problem: Infection  Goal: Absence of Infection Signs and Symptoms  Outcome: Shift Focus  Intervention: Prevent or Manage Infection  Recent Flowsheet Documentation  Taken 06/11/2024 0718 by Cristopher Ernst, RN  Infection Management: aseptic technique maintained  Isolation Precautions: protective precautions maintained

## 2024-06-11 NOTE — Plan of Care (Signed)
 Pt day -1 from Allo SCT. Afebrile, other VSS on room air. PRN IV dilaudid  given for CVAD site/back pain. Labs drawn, Mag replacement infusing per orders. Denies any n/v/d. Pt free from falls/injuries this shift.     Problem: Adult Inpatient Plan of Care  Goal: Absence of Hospital-Acquired Illness or Injury  Intervention: Identify and Manage Fall Risk  Recent Flowsheet Documentation  Taken 06/10/2024 2041 by Theophilus Begun, RN  Safety Interventions:   bleeding precautions   chemotherapeutic agent precautions   environmental modification   fall reduction program maintained   infection management   isolation precautions   lighting adjusted for tasks/safety   low bed   neutropenic precautions   nonskid shoes/slippers when out of bed  Intervention: Prevent Infection  Recent Flowsheet Documentation  Taken 06/10/2024 2041 by Theophilus Begun, RN  Infection Prevention:   cohorting utilized   environmental surveillance performed   equipment surfaces disinfected   hand hygiene promoted   personal protective equipment utilized   rest/sleep promoted   single patient room provided   visitors restricted/screened     Problem: Infection  Goal: Absence of Infection Signs and Symptoms  Intervention: Prevent or Manage Infection  Recent Flowsheet Documentation  Taken 06/10/2024 2041 by Theophilus Begun, RN  Infection Management: aseptic technique maintained  Isolation Precautions: protective precautions maintained

## 2024-06-11 NOTE — Progress Notes (Signed)
 BONE MARROW TRANSPLANT AND CELLULAR THERAPY PROGRESS NOTE    [name]  MRN: [id number]  Encounter Date: 06/11/24    Referring physician:  Dr. Toribio Bunker  BMT Attending MD: Dr. Jeaneen    Disease:Ph+B ALL  Current disease status: CR MRD Negative  Type of Transplant: RIC MUD Allo  Graft Source: Fresh PBSCs  Transplant Day: -1     Interval History:  Patricia [name] is a 48 y.o. 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 Patricia [name] now presents for a matched unrelated donor stem cell transplant.      - [name]. Afebrile. VSS.  - Continues to report line insertion pain improving. Chronic back pain exacerbated overnight requiring pain control as well. Encouraged use of Tramadol  over IV Dilaudid .  - Recorded 1.8L PO intake in the last 24h. Encouraged to increase PO intake.   - BG up to the 300s today. Increased Lantus  to 15u today.   - Melphalan due today. Discussed the importance of oral cryotherapy for Melphalan infusion.     Review of Systems:  A full system review was performed and was negative except as noted in the above interval history.    Temp:  [36.5 ??C (97.7 ??F)-36.8 ??C (98.2 ??F)] 36.7 ??C (98.1 ??F)  Pulse:  [79-101] 100  Resp:  [18-20] 20  BP: (122-136)/(80-99) 123/86  MAP (mmHg):  [94-112] 96  SpO2:  [97 %-100 %] 99 %    I/O last 3 completed shifts:  In: 2740 [P.O.:1660; I.V.:80; IV Piggyback:1000]  Out: 5075 [Urine:5075]  No intake/output data recorded.    Last 5 Recorded Weights    06/07/24 1129 06/08/24 0630 06/08/24 2009 06/09/24 2023   Weight: 79.2 kg (174 lb 11.2 oz) 79.7 kg (175 lb 11.2 oz) 80.4 kg (177 lb 4.8 oz) 78.4 kg (172 lb 12.8 oz)     Weight change:     Test Results:   Reviewed in EPIC. Abnormal values discussed below.     Scheduled Meds:Scheduled Medications[1]  Continuous Infusions:Infusions Meds[2]  PRN Meds:.PRN Medications[3]    Physical Exam: no change from 06/09/2024  General : No acute distress noted.   Central venous access: Line clean, dry, intact. No erythema or drainage noted.   ENT: Moist mucous membranes. Oropharhynx without lesions, erythema or exudate.   Cardiovascular: Pulse normal rate, regularity and rhythm. S1 and S2 normal, without any murmur, rub, or gallop.  Lungs: Clear to auscultation bilaterally, without wheezes/crackles/rhonchi. Good air movement.   Skin: Warm, dry, intact. No rash noted.   Psychiatry: Alert and oriented to person, place, and time.   Gastrointestinal: Normoactive bowel sounds, abdomen soft, non-tender   Musculoskeletal/Extremities: Full range of motion in shoulder, elbow, hip knee, ankle, wrists and feet. No edema.   Neurologic: CNII-XII intact. Normal strength and sensation throughout    Assessment/Plan:    BMT: ALL   CR MRD Negative  HCT-CI (age adjusted) 39 (age, anxiety, reduced DLCO, HF (reduced EF), T2DM, CMV viremia)  Conditioning:.  1. Fludarabine 40 mg/m2 days -5, -4, -3, -2  2. Melphalan 100  mg/m2 day -1      Donor: 10/10, Female, ABO O-, CMV negative  Engraftment: G-CSF starting Day + 5 through WBC recovery (as defined as ANC 1.0 x 2 days or 3.0 x 1 day)  -Date of last G-CSF injection: TBD     GVHD prophylaxis:   1.Tacrolimus starting on day -3 (goal 5-10 ng/mL)  2. Methotrexate  5 mg/m2 IVP on days +1, +3, +6 and +11  3.  ATG per [facility] standard dosing will not be included     Hem: Transfusion criteria: Transfuse 1 unit of PRBCs for hemoglobin <7 and 1 unit of platelets for Plt <10K or bleeding. Patricia [name]. Patricia [name] does not have a history of transfusion reactions. Consent was obtained and documented on 06/05/24.    ID:   Prophylaxis:  -Antiviral: Valtrex  500 mg po BID started on admission and continue for 2 year      Letermovir  indicated as R/CMV + D/CMV - and with prior hx of CMV viremia, plan to continue on admission through at least day +200  -Antifungal: Fluconazole  400 mg po daily to start day 0 thorugh day +75  -Antibacterial: Levaquin  500 mg po daily to start day 0, continue while neutropenic  -PJP: Sulfa allergic. Dapsone  daily upon platelet engraftment, has tolerated this well in the past. Toxo PCR negative.   -11/7 stopped Dapsone  upon admission, restart with platelet engraftment.      Patient was screened for upper respiratory symptoms, including S/S of Covid-19. bmt prescreen symptoms: Patient reports upper respiratory symptoms.  bmtu rpp testing: RPP to be ordered and performed ahead of BMTU admission. Patient educated on continued need to inform medical team of any new symptoms.     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   Previously required Valcyte  treatment  -Continue Letermovir  and Valtrex  BID through day +200 post SCT.      Recurrent Acute Sinusitis  Recurrent acute sinusitis with nasal congestion, frontal headache, and low-grade fever. Responded well to Augmentin .  -11/7 current sx resolving, started Augmentin  10 days (10/30-11/9)     Hypogammaglobulinemia  Monthly IVIG - last on 05/16/2024  - 11/7 check weekly IgG levels     GI:   GERD prophylaxis:  -Pepcid  BID     Nausea:  -Anti-emetics per BMT protocol.      Diarrhea:  -Antidiarrheals per BMT protocol    Renal: Creatinine normal. No issues.      FEN:    Electrolyte replacement per protocol    Hepatic: Normal bilirubin. SOS prophylaxis not indicated with RIC regimen.     CV: Followed by [facility] Cardiology: Heart failure with mildly reduced ejection fraction (HFmrEF)   Hx of EF 45-50%, most recent Echo 05/23/24 improved to 55-60%  Hx of prolonged QTc, last EKG 489 follow closely with addition of QTc prolonging medications  -11/7 QTc 457. Check weekly EKGs (next on 11/14), added cardiac lytes   -11/8: 40 meq of K  -11/9: 20 meq of K     Hypertension: Home med Losartan  50 mg daily.     Pulm: No pulmonary symptoms currently. Reduced DLCO      Endocrine:   Menses suppression: Lupron  last 07/26/22.  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 At home uses Dexcom for monitoring.  -11/7 ACHS and SSI while inpatient. Lantus  25 units in AM. Can consider using Dexcom next week if numbers similar to hospital glucometer results   -11/8: holding Lantus  due to morning POC 89  - 11/9: Blood glucose range 131 -189, resumed Lantus  at lower dose 10 Units    Neuro/Pain:  - PRN tramadol  50 mg added. No oxy as this give patient migraines   - 11/8: Tramadol  was not helping, Morphine  with mild relief. Ordered dilaudid  0.5mg  q8 prn x 2 days for severe pain, removed tramadol  prn.  - 11/11: Tramadol  50mg  Q6H ordered PRN  with goal to transition to oral medications for pain control.    Psych: Followed by Palliative Care     Anxiety: Home med: Lexapro  10 mg daily   Reconnected with her local therapist   - If pt continues to endorse anxiety, may increase Lexapro  to 20 mg daily  - 11/7 added PRN ativan  0.5 mg Q8     Caregivers: Lives in [place], [place]  Primary caregiver: Patricia [name] (mom)- first 30 days at [facility] then home if doing well will stay longer if medically needed  Secondary caregiver: Patricia [name] (husband)     Consults: While inpatient, consider consults to music therapy, integrative medicine and massage therapy.     Patricia [name], ACNP  [place] [facility]         [1]    cetirizine   10 mg Oral Daily    escitalopram  oxalate  10 mg Oral Daily    famotidine   20 mg Oral BID    [START ON 06/12/2024] fluconazole   400 mg Oral Daily    fosaprepitant  150 mg Intravenous Once    heparin , porcine (PF)  2 mL Intravenous Mon,Wed,Fri    insulin  glargine  15 Units Subcutaneous Daily    insulin  lispro  0-20 Units Subcutaneous TID AC    letermovir   480 mg Oral Nightly    [START ON 06/12/2024] levoFLOXacin   500 mg Oral Daily    losartan   50 mg Oral Daily    melphalan  100 mg/m2 (Treatment Plan Adjusted) Intravenous Once    tacrolimus  0.03 mg/kg (Treatment Plan Recorded) Oral BID    valACYclovir   500 mg Oral BID   [2]    IP okay to treat      sodium chloride  20 mL/hr (06/07/24 1326)   [3] albuterol , aluminum-magnesium  hydroxide-simethicone, CETAPHIL, dexAMETHasone , dextrose  in water, diphenhydrAMINE , emollient combination no.92, EPINEPHrine IM, famotidine  (PEPCID ) IV, glucagon , glucose, HYDROmorphone , IP okay to treat, lidocaine , lidocaine , loperamide , loperamide , LORazepam , magnesium  sulfate, methylPREDNISolone  sodium succinate , potassium chloride  in water, prochlorperazine  **OR** prochlorperazine , sodium chloride  0.9%, traMADol 

## 2024-06-11 NOTE — Consults (Signed)
 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 She is continuing tacrolimus and MTX for GVHD prophylaxis.     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     Date of Transplant: 06/12/24      Prior Dosing Information: see table below     Source(s) of information used to determine prior to admission dosing: MAR    Goals:  Therapeutic Drug Levels  Tacrolimus trough goal: 5-10 ng/mL    Additional Clinical Monitoring/Outcomes  Monitor renal function (SCr and urine output) and liver function (LFTs)  Monitor for signs/symptoms of adverse events (e.g., hyperglycemia, hyperkalemia, hypomagnesemia, hypertension, headache, tremor)    Previous Lab Results:  Tacrolimus, Trough   Date Value Ref Range Status   06/11/2024 16.1 (H) 5.0 - 15.0 ng/mL Final     Creatinine   Date Value Ref Range Status   06/10/2024 0.51 (L) 0.55 - 1.02 mg/dL Final   88/90/7974 9.55 (L) 0.55 - 1.02 mg/dL Final   88/91/7974 9.48 (L) 0.55 - 1.02 mg/dL Final        Result:  Tacrolimus level from today was drawn appropriately     Pharmacokinetic Considerations and Significant Drug Interactions:  Concurrent CYP3A4 substrates/inhibitors: fluconazole  (moderate inhibitor, to start 11/12)    Assessment/Plan:  Recommendation(s)  Trough surprisingly supratherapeutic after initial loading doses  SCr stable/WNL  Bili/LFTs from 11/10 also stable/WNL  Unclear elevation in trough today; will plan to hold dosing for now and restart as concentration approaches 10 ng/mL    Follow-up  Next level has been ordered on 11/12 at 0800.   A pharmacist will continue to monitor and recommend levels as appropriate    Longitudinal Dose Monitoring:  Date Dose (mg), Route AM Scr (mg/dL) Level  (ng/mL) Key Drug Interactions   11/9 3.5/3.5 0.51     11/10 3.5/3.5 0.44     11/11 2.5/--- 0.51 16.1      Please page service pharmacist with questions/clarifications.    [name], PharmD, BCPS, BCOP, CPP  Bone Marrow Transplant and Cellular Therapy

## 2024-06-12 DIAGNOSIS — C9100 Acute lymphoblastic leukemia not having achieved remission: Principal | ICD-10-CM

## 2024-06-12 LAB — BASIC METABOLIC PANEL
ANION GAP: 12 mmol/L (ref 5–14)
BLOOD UREA NITROGEN: 11 mg/dL (ref 9–23)
BUN / CREAT RATIO: 21
CALCIUM: 9.3 mg/dL (ref 8.7–10.4)
CHLORIDE: 101 mmol/L (ref 98–107)
CO2: 24 mmol/L (ref 20.0–31.0)
CREATININE: 0.53 mg/dL — ABNORMAL LOW (ref 0.55–1.02)
EGFR CKD-EPI (2021) FEMALE: >90 mL/min/1.73m2 (ref >=60)
GLUCOSE RANDOM: 325 mg/dL — ABNORMAL HIGH (ref 70–179)
POTASSIUM: 5.1 mmol/L — ABNORMAL HIGH (ref 3.4–4.8)
SODIUM: 137 mmol/L (ref 135–145)

## 2024-06-12 LAB — CBC W/ AUTO DIFF
BASOPHILS ABSOLUTE COUNT: 0 10*9/L (ref 0.0–0.1)
BASOPHILS RELATIVE PERCENT: 0.2 %
EOSINOPHILS ABSOLUTE COUNT: 0 10*9/L (ref 0.0–0.5)
EOSINOPHILS RELATIVE PERCENT: 0 %
HEMATOCRIT: 31.1 % — ABNORMAL LOW (ref 34.0–44.0)
HEMOGLOBIN: 10.8 g/dL — ABNORMAL LOW (ref 11.3–14.9)
LYMPHOCYTES ABSOLUTE COUNT: 0 10*9/L — ABNORMAL LOW (ref 1.1–3.6)
LYMPHOCYTES RELATIVE PERCENT: 0.9 %
MEAN CORPUSCULAR HEMOGLOBIN CONC: 34.6 g/dL (ref 32.0–36.0)
MEAN CORPUSCULAR HEMOGLOBIN: 32.6 pg — ABNORMAL HIGH (ref 25.9–32.4)
MEAN CORPUSCULAR VOLUME: 94 fL (ref 77.6–95.7)
MEAN PLATELET VOLUME: 10.6 fL (ref 6.8–10.7)
MONOCYTES ABSOLUTE COUNT: 0 10*9/L — ABNORMAL LOW (ref 0.3–0.8)
MONOCYTES RELATIVE PERCENT: 0.1 %
NEUTROPHILS ABSOLUTE COUNT: 2.5 10*9/L (ref 1.8–7.8)
NEUTROPHILS RELATIVE PERCENT: 98.8 %
PLATELET COUNT: 110 10*9/L — ABNORMAL LOW (ref 150–450)
RED BLOOD CELL COUNT: 3.31 10*12/L — ABNORMAL LOW (ref 3.95–5.13)
RED CELL DISTRIBUTION WIDTH: 14.4 % (ref 12.2–15.2)
WBC ADJUSTED: 2.5 10*9/L — ABNORMAL LOW (ref 3.6–11.2)

## 2024-06-12 LAB — CD34 ENUMERATION
CD34*: 2.2 %
CD3: 33 %
CD45*: 100 %

## 2024-06-12 LAB — MAGNESIUM: MAGNESIUM: 1.9 mg/dL (ref 1.6–2.6)

## 2024-06-12 LAB — TACROLIMUS LEVEL, TROUGH: TACROLIMUS, TROUGH: 10.7 ng/mL (ref 5.0–15.0)

## 2024-06-12 MED ADMIN — famotidine (PEPCID) tablet 20 mg: 20 mg | ORAL | @ 13:00:00

## 2024-06-12 MED ADMIN — famotidine (PEPCID) tablet 20 mg: 20 mg | ORAL | @ 02:00:00

## 2024-06-12 MED ADMIN — escitalopram oxalate (LEXAPRO) tablet 10 mg: 10 mg | ORAL | @ 13:00:00

## 2024-06-12 MED ADMIN — losartan (COZAAR) tablet 50 mg: 50 mg | ORAL | @ 13:00:00

## 2024-06-12 MED ADMIN — fluconazole (DIFLUCAN) tablet 400 mg: 400 mg | ORAL | @ 13:00:00 | Stop: 2024-07-12

## 2024-06-12 MED ADMIN — letermovir (PREVYMIS) tablet 480 mg: 480 mg | ORAL | @ 02:00:00

## 2024-06-12 MED ADMIN — heparin, porcine (PF) 100 unit/mL injection 2 mL: 2 mL | INTRAVENOUS | @ 04:00:00

## 2024-06-12 MED ADMIN — magnesium sulfate 2gm/50mL IVPB: 2 g | INTRAVENOUS | @ 09:00:00

## 2024-06-12 MED ADMIN — prochlorperazine (COMPAZINE) injection 10 mg: 10 mg | INTRAVENOUS | @ 14:00:00

## 2024-06-12 MED ADMIN — prochlorperazine (COMPAZINE) injection 10 mg: 10 mg | INTRAVENOUS | @ 02:00:00

## 2024-06-12 MED ADMIN — insulin glargine (LANTUS) injection BASAL 18 Units: 18 [IU] | SUBCUTANEOUS | @ 14:00:00

## 2024-06-12 MED ADMIN — cetirizine (ZYRTEC) tablet 10 mg: 10 mg | ORAL | @ 13:00:00

## 2024-06-12 MED ADMIN — insulin lispro (HumaLOG) injection CORRECTIONAL 0-20 Units: 0-20 [IU] | SUBCUTANEOUS | @ 17:00:00

## 2024-06-12 MED ADMIN — insulin lispro (HumaLOG) injection CORRECTIONAL 0-20 Units: 0-20 [IU] | SUBCUTANEOUS | @ 13:00:00

## 2024-06-12 MED ADMIN — insulin lispro (HumaLOG) injection CORRECTIONAL 0-20 Units: 0-20 [IU] | SUBCUTANEOUS | @ 22:00:00

## 2024-06-12 MED ADMIN — valACYclovir (VALTREX) tablet 500 mg: 500 mg | ORAL | @ 02:00:00

## 2024-06-12 MED ADMIN — valACYclovir (VALTREX) tablet 500 mg: 500 mg | ORAL | @ 13:00:00

## 2024-06-12 MED ADMIN — levoFLOXacin (LEVAQUIN) tablet 500 mg: 500 mg | ORAL | @ 13:00:00 | Stop: 2024-07-12

## 2024-06-12 MED ADMIN — traMADol (ULTRAM) tablet 50 mg: 50 mg | ORAL | @ 02:00:00

## 2024-06-12 MED ADMIN — insulin lispro (HumaLOG) injection 4 Units: 4 [IU] | SUBCUTANEOUS | @ 17:00:00 | Stop: 2024-06-12

## 2024-06-12 MED ADMIN — diphenhydrAMINE (BENADRYL) capsule/tablet 25 mg: 25 mg | ORAL | @ 19:00:00 | Stop: 2024-06-12

## 2024-06-12 MED ADMIN — sodium chloride 0.9% (NS) bolus 1,000 mL: 1000 mL | INTRAVENOUS | @ 20:00:00 | Stop: 2024-06-12

## 2024-06-12 MED ADMIN — acetaminophen (TYLENOL) tablet 650 mg: 650 mg | ORAL | @ 19:00:00 | Stop: 2024-06-12

## 2024-06-12 NOTE — Progress Notes (Signed)
 BONE MARROW TRANSPLANT AND CELLULAR THERAPY PROGRESS NOTE    [name]  MRN: [id number]  Encounter Date: 06/12/24    Referring physician:  Dr. Toribio Bunker  BMT Attending MD: Dr. Jeaneen    Disease:Ph+B ALL  Current disease status: CR MRD Negative  Type of Transplant: RIC MUD Allo  Graft Source: Fresh PBSCs  Transplant Day: 0     Interval History:  Patricia [name] is a 48 y.o. 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 Patricia [name] now presents for a matched unrelated donor stem cell transplant.      - [name]. Afebrile. VSS.  - Pain controlled with Tramadol  PRN, heat therapy. Discontinued Diluadid today.   - Recorded 2L PO intake in the last 24h. Continue to encourage PO intake.   - BG up to the 400s today with dose of Decadron  yesterday requiring a total of additional 24u Humalog  over the course of the day. Increased Lantus  to 18u today.   - Transplant scheduled for this afternoon.     Review of Systems:  A full system review was performed and was negative except as noted in the above interval history.    Temp:  [36.4 ??C (97.5 ??F)-36.6 ??C (97.9 ??F)] 36.4 ??C (97.5 ??F)  Pulse:  [88-103] 91  Resp:  [16-18] 16  BP: (107-138)/(78-94) 110/84  MAP (mmHg):  [89-107] 94  SpO2:  [95 %-100 %] 100 %    I/O last 3 completed shifts:  In: 2921 [P.O.:2720; I.V.:201]  Out: 3475 [Urine:3475]  I/O this shift:  In: 450 [P.O.:450]  Out: 300 [Urine:300]    Last 5 Recorded Weights    06/07/24 1129 06/08/24 0630 06/08/24 2009 06/09/24 2023   Weight: 79.2 kg (174 lb 11.2 oz) 79.7 kg (175 lb 11.2 oz) 80.4 kg (177 lb 4.8 oz) 78.4 kg (172 lb 12.8 oz)    06/11/24 2027   Weight: 77.3 kg (170 lb 6.4 oz)     Weight change:     Test Results:   Reviewed in EPIC. Abnormal values discussed below.     Scheduled Meds:Scheduled Medications[1]  Continuous Infusions:Infusions Meds[2]  PRN Meds:.PRN Medications[3]    Physical Exam:   General : Sitting up in bed. No acute distress noted.   Central venous access: Line clean, dry, intact. No erythema or drainage noted.   ENT: Moist mucous membranes. Oropharhynx without lesions, erythema or exudate.   Cardiovascular: Pulse normal rate, regularity and rhythm. S1 and S2 normal, without any murmur, rub, or gallop.  Lungs: Clear to auscultation bilaterally, without wheezes/crackles/rhonchi. Good air movement.   Skin: Warm, dry, intact. No rash noted.   Psychiatry: Alert and oriented to person, place, and time.   Gastrointestinal: Normoactive bowel sounds, abdomen soft, non-tender   Musculoskeletal/Extremities: Full range of motion in shoulder, elbow, hip knee, ankle, wrists and feet. No edema.   Neurologic: CNII-XII intact. Normal strength and sensation throughout    Assessment/Plan:    BMT: ALL  CR MRD Negative  HCT-CI (age adjusted) 80 (age, anxiety, reduced DLCO, HF (reduced EF), T2DM, CMV viremia)  Conditioning:.  1. Fludarabine 40 mg/m2 days -5, -4, -3, -2  2. Melphalan 100  mg/m2 day -1      Donor: 10/10, Female, ABO O-, CMV negative  Engraftment: G-CSF starting Day + 5 through WBC recovery (as defined as ANC 1.0 x 2 days or 3.0 x 1 day)  -Date of last G-CSF injection: TBD     GVHD prophylaxis:   1.Tacrolimus starting on day -  3 (goal 5-10 ng/mL)  2. Methotrexate  5 mg/m2 IVP on days +1, +3, +6 and +11  3.  ATG per [facility] standard dosing will not be included     Hem: Transfusion criteria: Transfuse 1 unit of PRBCs for hemoglobin <7 and 1 unit of platelets for Plt <10K or bleeding. Patricia [name]. Patricia [name] does not have a history of transfusion reactions. Consent was obtained and documented on 06/05/24.    ID:   Prophylaxis:  -Antiviral: Valtrex  500 mg po BID started on admission and continue for 2 year      Letermovir  indicated as R/CMV + D/CMV - and with prior hx of CMV viremia, plan to continue on admission through at least day +200  -Antifungal: Fluconazole  400 mg po daily to start day 0 thorugh day +75  -Antibacterial: Levaquin  500 mg po daily to start day 0, continue while neutropenic  -PJP: Sulfa allergic. Dapsone  daily upon platelet engraftment, has tolerated this well in the past. Toxo PCR negative.   -11/7 stopped Dapsone  upon admission, restart with platelet engraftment.      Patient was screened for upper respiratory symptoms, including S/S of Covid-19. bmt prescreen symptoms: Patient reports upper respiratory symptoms.  bmtu rpp testing: RPP to be ordered and performed ahead of BMTU admission. Patient educated on continued need to inform medical team of any new symptoms.     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   Previously required Valcyte  treatment  -Continue Letermovir  and Valtrex  BID through day +200 post SCT.      Recurrent Acute Sinusitis  Recurrent acute sinusitis with nasal congestion, frontal headache, and low-grade fever. Responded well to Augmentin .  -11/7 current sx resolving, started Augmentin  10 days (10/30-11/9)     Hypogammaglobulinemia  Monthly IVIG - last on 05/16/2024  - 11/7 check weekly IgG levels     GI:   GERD prophylaxis:  -Pepcid  BID     Nausea:  -Anti-emetics per BMT protocol.      Diarrhea:  -Antidiarrheals per BMT protocol    Renal: Creatinine normal. No issues.      FEN:    Electrolyte replacement per protocol    Hepatic: Normal bilirubin. SOS prophylaxis not indicated with RIC regimen.     CV: [facility]ed by [facility]: Heart failure with mildly reduced ejection fraction (HFmrEF)   Hx of EF 45-50%, most recent Echo 05/23/24 improved to 55-60%  Hx of prolonged QTc, last EKG 489 follow closely with addition of QTc prolonging medications  -11/7 QTc 457. Check weekly EKGs (next on 11/14), added cardiac lytes   -11/8: 40 meq of K  -11/9: 20 meq of K     Hypertension: Home med Losartan  50 mg daily.     Pulm: No pulmonary symptoms currently. Reduced DLCO      Endocrine:   Menses suppression: Lupron  last 07/26/22.  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 At home uses Dexcom for monitoring.  - 11/7 ACHS and SSI while inpatient. Lantus  25 units in AM. Can consider using Dexcom next week if numbers similar to hospital glucometer results   - 11/8: holding Lantus  due to morning POC 89  - 11/9: Blood glucose range 131 -189, resumed Lantus  at lower dose 10 Units  - 11/11: BG 200-400s in setting of steroids. Increased Lantus  to 15u.  - 11/12: BG 300s. Increased Lantus  to 18u.     Neuro/Pain:  - PRN tramadol  50  mg added. No oxy as this give patient migraines   - 11/8: Tramadol  was not helping, Morphine  with mild relief. Ordered dilaudid  0.5mg  q8 prn x 2 days for severe pain, removed tramadol  prn.  - 11/11: Tramadol  50mg  Q6H ordered PRN with goal to transition to oral medications for pain control.  - 11/12: Discontinued Dilaudid . Manage chronic hip/back pain with Tramadol  q6h PRN + heat therapy.     Psych: Followed by Palliative Care     Anxiety: Home med: Lexapro  10 mg daily   Reconnected with her local therapist   - If pt continues to endorse anxiety, may increase Lexapro  to 20 mg daily  - 11/7 added PRN ativan  0.5 mg Q8     Caregivers: Lives in [place], [place]  Primary caregiver: Jeanie Liston (mom)- first 30 days at [facility] then home if doing well will stay longer if medically needed  Secondary caregiver: Devaughn Ranger (husband)     Consults: While inpatient, consider consults to music therapy, integrative medicine and massage therapy.     Patricia [name], ACNP  [facility]         [1]    acetaminophen   650 mg Oral Once    cetirizine   10 mg Oral Daily    diphenhydrAMINE   25 mg Oral Once    escitalopram  oxalate  10 mg Oral Daily    famotidine   20 mg Oral BID    [START ON 06/24/2024] filgrastim   480 mcg Subcutaneous Q24H    fluconazole   400 mg Oral Daily    heparin , porcine (PF)  2 mL Intravenous Mon,Wed,Fri    insulin  glargine  18 Units Subcutaneous Daily    insulin  lispro  0-20 Units Subcutaneous TID AC    letermovir   480 mg Oral Nightly    levoFLOXacin   500 mg Oral Daily    losartan  50 mg Oral Daily    [START ON 06/13/2024] methotrexate  (Preservative Free)  5 mg/m2 (Treatment Plan Adjusted) Intravenous Once    [START ON 06/15/2024] methotrexate  (Preservative Free)  5 mg/m2 (Treatment Plan Adjusted) Intravenous Once    [START ON 06/18/2024] methotrexate  (Preservative Free)  5 mg/m2 (Treatment Plan Adjusted) Intravenous Once    [START ON 06/23/2024] methotrexate  (Preservative Free)  5 mg/m2 (Treatment Plan Adjusted) Intravenous Once    valACYclovir   500 mg Oral BID   [2]    IP okay to treat      sodium chloride  20 mL/hr (06/07/24 1326)   [3] albuterol , aluminum-magnesium  hydroxide-simethicone, atropine, CETAPHIL, dexAMETHasone , dextrose  in water, diphenhydrAMINE , diphenhydrAMINE , emollient combination no.92, EPINEPHrine IM, EPINEPHrine IM, famotidine  (PEPCID ) IV, glucagon , glucose, HYDROmorphone , IP okay to treat, lidocaine , lidocaine , loperamide , loperamide , LORazepam , magnesium  sulfate, methylPREDNISolone  sodium succinate , methylPREDNISolone  sodium succinate , midazolam , potassium chloride  in water, prochlorperazine  **OR** prochlorperazine , prochlorperazine , prochlorperazine , sodium chloride , sodium chloride , sodium chloride  0.9%, traMADol 

## 2024-06-12 NOTE — Procedures (Signed)
 Procedures    Cell Infusion Procedure Note    [name]  Patient Age: 48 y.o.  Cell Infusion Date: Delivery Date: 06/12/24        Diagnosis: ALL    Procedure / Indications: Hematopoietic Stem Cell Infusion    Time out performed: Immediately prior to procedure    Description: HSCT: Preparative regimen has been completed prior to infusion of stem cells.      Infusion:    Pre-medications: Administered as ordered    Cell product: HSCT product infused: Allo,  Unrelated, Fresh, Peripheral Blood Stem Cells    Complications:    Patient experienced the following complications: Infusion Complications: Hypotension         Specifically the patient did not experience any of the following complications:  Coughing, Bradycardia, Broken Bag, Chills, Diarrhea, Dyspnea, Fever, Headache, Hematuria, Hypertension, Hypotension, Hypoxia, Nausea, Tachycardia, and Vomiting               Please refer to nursing documentation for any additional medications given.     Specimen(s):     All intended products were infused.    Teaching Physician Note:  I was available throughout the procedure.

## 2024-06-12 NOTE — Consults (Signed)
 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 She is continuing tacrolimus and MTX for GVHD prophylaxis.     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     Date of Transplant: 06/12/24      Prior Dosing Information: see table below     Source(s) of information used to determine prior to admission dosing: MAR    Goals:  Therapeutic Drug Levels  Tacrolimus trough goal: 5-10 ng/mL    Additional Clinical Monitoring/Outcomes  Monitor renal function (SCr and urine output) and liver function (LFTs)  Monitor for signs/symptoms of adverse events (e.g., hyperglycemia, hyperkalemia, hypomagnesemia, hypertension, headache, tremor)    Previous Lab Results:  Tacrolimus, Trough   Date Value Ref Range Status   06/12/2024 10.7 5.0 - 15.0 ng/mL Final   06/11/2024 16.1 (H) 5.0 - 15.0 ng/mL Final     Creatinine   Date Value Ref Range Status   06/11/2024 0.53 (L) 0.55 - 1.02 mg/dL Final   88/89/7974 9.48 (L) 0.55 - 1.02 mg/dL Final   88/90/7974 9.55 (L) 0.55 - 1.02 mg/dL Final        Result:  Tacrolimus level from today was drawn appropriately     Pharmacokinetic Considerations and Significant Drug Interactions:  Concurrent CYP3A4 substrates/inhibitors: fluconazole  (moderate inhibitor, to start 11/12)    Assessment/Plan:  Recommendation(s)  Trough still above goal after holding 2 maintenance doses of tacro  SCr stable/WNL  Bili/LFTs from 11/10 also stable/WNL  Supratherapeutic level on 11/11 likely from loading dose which is clearing well since holding further doses. Plan to resume tacrolimus at 2 mg twice daily    Follow-up  Next level has been ordered on 11/14 at 0800.   A pharmacist will continue to monitor and recommend levels as appropriate    Longitudinal Dose Monitoring:  Date Dose (mg), Route AM Scr (mg/dL) Level  (ng/mL) Key Drug Interactions   11/9 3.5/3.5 0.51     11/10 3.5/3.5 0.44     11/11 2.5/--- 0.51 16.1    11/12 ---/--- 0.53 10.7      Please page service pharmacist with questions/clarifications.    Lavanda Dings, PharmD  Inpatient Hematology/Oncology Pharmacist

## 2024-06-12 NOTE — Plan of Care (Signed)
 Pt day 0 for Allo SCT. Afebrile, other VSS on room air. PRN compazine  given for nausea and tramadol  for pain. Labs drawn, Mag replacement given per orders. No blood products needed. Pt free from falls/injuries this shift.     Problem: Adult Inpatient Plan of Care  Goal: Absence of Hospital-Acquired Illness or Injury  Intervention: Identify and Manage Fall Risk  Recent Flowsheet Documentation  Taken 06/11/2024 2047 by Theophilus Begun, RN  Safety Interventions:   chemotherapeutic agent precautions   bleeding precautions   environmental modification   infection management   isolation precautions   lighting adjusted for tasks/safety   low bed   neutropenic precautions   nonskid shoes/slippers when out of bed  Intervention: Prevent Infection  Recent Flowsheet Documentation  Taken 06/11/2024 2047 by Theophilus Begun, RN  Infection Prevention:   cohorting utilized   equipment surfaces disinfected   environmental surveillance performed   hand hygiene promoted   personal protective equipment utilized   rest/sleep promoted   single patient room provided   visitors restricted/screened     Problem: Infection  Goal: Absence of Infection Signs and Symptoms  Intervention: Prevent or Manage Infection  Recent Flowsheet Documentation  Taken 06/11/2024 2047 by Theophilus Begun, RN  Infection Management: aseptic technique maintained  Isolation Precautions: protective precautions maintained

## 2024-06-13 LAB — BASIC METABOLIC PANEL
ANION GAP: 11 mmol/L (ref 5–14)
BLOOD UREA NITROGEN: 19 mg/dL (ref 9–23)
BUN / CREAT RATIO: 37
CALCIUM: 8.9 mg/dL (ref 8.7–10.4)
CHLORIDE: 102 mmol/L (ref 98–107)
CO2: 25 mmol/L (ref 20.0–31.0)
CREATININE: 0.52 mg/dL — ABNORMAL LOW (ref 0.55–1.02)
EGFR CKD-EPI (2021) FEMALE: >90 mL/min/1.73m2 (ref >=60)
GLUCOSE RANDOM: 365 mg/dL — ABNORMAL HIGH (ref 70–179)
POTASSIUM: 4.6 mmol/L (ref 3.4–4.8)
SODIUM: 138 mmol/L (ref 135–145)

## 2024-06-13 LAB — HEPATIC FUNCTION PANEL
ALBUMIN: 3.6 g/dL (ref 3.4–5.0)
ALKALINE PHOSPHATASE: 71 U/L (ref 46–116)
ALT (SGPT): 21 U/L (ref 10–49)
AST (SGOT): 14 U/L (ref <=34)
BILIRUBIN DIRECT: 0.1 mg/dL (ref 0.00–0.30)
BILIRUBIN TOTAL: 0.5 mg/dL (ref 0.3–1.2)
PROTEIN TOTAL: 5.9 g/dL (ref 5.7–8.2)

## 2024-06-13 LAB — CBC W/ AUTO DIFF
BASOPHILS ABSOLUTE COUNT: 0 10*9/L (ref 0.0–0.1)
BASOPHILS RELATIVE PERCENT: 0.1 %
EOSINOPHILS ABSOLUTE COUNT: 0 10*9/L (ref 0.0–0.5)
EOSINOPHILS RELATIVE PERCENT: 0.2 %
HEMATOCRIT: 28.4 % — ABNORMAL LOW (ref 34.0–44.0)
HEMOGLOBIN: 9.8 g/dL — ABNORMAL LOW (ref 11.3–14.9)
LYMPHOCYTES ABSOLUTE COUNT: 0 10*9/L — ABNORMAL LOW (ref 1.1–3.6)
LYMPHOCYTES RELATIVE PERCENT: 0.7 %
MEAN CORPUSCULAR HEMOGLOBIN CONC: 34.6 g/dL (ref 32.0–36.0)
MEAN CORPUSCULAR HEMOGLOBIN: 32.9 pg — ABNORMAL HIGH (ref 25.9–32.4)
MEAN CORPUSCULAR VOLUME: 95.2 fL (ref 77.6–95.7)
MEAN PLATELET VOLUME: 10.8 fL — ABNORMAL HIGH (ref 6.8–10.7)
MONOCYTES ABSOLUTE COUNT: 0.1 10*9/L — ABNORMAL LOW (ref 0.3–0.8)
MONOCYTES RELATIVE PERCENT: 1.3 %
NEUTROPHILS ABSOLUTE COUNT: 4.3 10*9/L (ref 1.8–7.8)
NEUTROPHILS RELATIVE PERCENT: 97.7 %
PLATELET COUNT: 134 10*9/L — ABNORMAL LOW (ref 150–450)
RED BLOOD CELL COUNT: 2.98 10*12/L — ABNORMAL LOW (ref 3.95–5.13)
RED CELL DISTRIBUTION WIDTH: 14.6 % (ref 12.2–15.2)
WBC ADJUSTED: 4.4 10*9/L (ref 3.6–11.2)

## 2024-06-13 LAB — PBSC PROCESSING HPC
CD3 +  DOSE COLLECT: 121.44 10*6/kg
CD34*: 2.2 %
CD34+DOSECOLLECT: 8.1 10*6/kg
CD3: 33 %
TND COLLECTED: 3.68 10*8/kg

## 2024-06-13 LAB — INFUSION SUMMARY
CD3 DOSE INFUSED: 90.09 x10 6th{cells}/kg
CD34 DOSE INFUSE: 6.01 x10 6th{cells}/kg
TND INFUSED: 2.73 x10 8th{cells}/kg
TOTAL VOL INFUSE: 231 mL

## 2024-06-13 LAB — APTT
APTT: 27.9 s (ref 24.8–38.4)
HEPARIN CORRELATION: 0.2

## 2024-06-13 LAB — CRYOPRESERVATION

## 2024-06-13 LAB — LACTATE DEHYDROGENASE: LACTATE DEHYDROGENASE: 231 U/L (ref 120–246)

## 2024-06-13 LAB — PHOSPHORUS: PHOSPHORUS: 3.5 mg/dL (ref 2.4–5.1)

## 2024-06-13 LAB — MAGNESIUM: MAGNESIUM: 1.8 mg/dL (ref 1.6–2.6)

## 2024-06-13 LAB — PROTIME-INR
INR: 0.94
PROTIME: 10.7 s (ref 9.9–12.6)

## 2024-06-13 LAB — IONIZED CALCIUM VENOUS: CALCIUM IONIZED VENOUS (MG/DL): 4.29 mg/dL — ABNORMAL LOW (ref 4.40–5.40)

## 2024-06-13 MED ADMIN — famotidine (PEPCID) tablet 20 mg: 20 mg | ORAL | @ 01:00:00

## 2024-06-13 MED ADMIN — famotidine (PEPCID) tablet 20 mg: 20 mg | ORAL | @ 14:00:00

## 2024-06-13 MED ADMIN — escitalopram oxalate (LEXAPRO) tablet 10 mg: 10 mg | ORAL | @ 14:00:00

## 2024-06-13 MED ADMIN — losartan (COZAAR) tablet 50 mg: 50 mg | ORAL | @ 13:00:00

## 2024-06-13 MED ADMIN — fluconazole (DIFLUCAN) tablet 400 mg: 400 mg | ORAL | @ 14:00:00 | Stop: 2024-07-12

## 2024-06-13 MED ADMIN — letermovir (PREVYMIS) tablet 480 mg: 480 mg | ORAL | @ 01:00:00

## 2024-06-13 MED ADMIN — magnesium sulfate 2gm/50mL IVPB: 2 g | INTRAVENOUS | @ 10:00:00

## 2024-06-13 MED ADMIN — prochlorperazine (COMPAZINE) injection 10 mg: 10 mg | INTRAVENOUS | @ 21:00:00

## 2024-06-13 MED ADMIN — insulin glargine (LANTUS) injection BASAL 18 Units: 18 [IU] | SUBCUTANEOUS | @ 14:00:00

## 2024-06-13 MED ADMIN — cetirizine (ZYRTEC) tablet 10 mg: 10 mg | ORAL | @ 14:00:00

## 2024-06-13 MED ADMIN — insulin lispro (HumaLOG) injection CORRECTIONAL 0-20 Units: 0-20 [IU] | SUBCUTANEOUS | @ 14:00:00

## 2024-06-13 MED ADMIN — insulin lispro (HumaLOG) injection CORRECTIONAL 0-20 Units: 0-20 [IU] | SUBCUTANEOUS | @ 23:00:00

## 2024-06-13 MED ADMIN — valACYclovir (VALTREX) tablet 500 mg: 500 mg | ORAL | @ 01:00:00

## 2024-06-13 MED ADMIN — valACYclovir (VALTREX) tablet 500 mg: 500 mg | ORAL | @ 13:00:00

## 2024-06-13 MED ADMIN — levoFLOXacin (LEVAQUIN) tablet 500 mg: 500 mg | ORAL | @ 13:00:00 | Stop: 2024-07-12

## 2024-06-13 MED ADMIN — tacrolimus (PROGRAF) capsule 2 mg: 2 mg | ORAL | @ 13:00:00

## 2024-06-13 MED ADMIN — tacrolimus (PROGRAF) capsule 2 mg: 2 mg | ORAL | @ 01:00:00

## 2024-06-13 MED ADMIN — traMADol (ULTRAM) tablet 50 mg: 50 mg | ORAL | @ 01:00:00

## 2024-06-13 MED ADMIN — methotrexate (Preservative Free) injection: 5 mg/m2 | INTRAVENOUS | @ 22:00:00 | Stop: 2024-06-13

## 2024-06-13 MED ADMIN — insulin lispro (HumaLOG) injection 6 Units: 6 [IU] | SUBCUTANEOUS | @ 23:00:00 | Stop: 2024-06-13

## 2024-06-13 MED ADMIN — calcium gluconate in sodium chloride (NS) 0.9% 2 gram/100 mL IVPB 2 g: 2 g | INTRAVENOUS | @ 16:00:00 | Stop: 2024-06-13

## 2024-06-13 NOTE — Progress Notes (Signed)
 BONE MARROW TRANSPLANT AND CELLULAR THERAPY PROGRESS NOTE    [name]  MRN: [id number]  Encounter Date: 06/13/24    Referring physician:  Dr. Toribio Bunker  BMT Attending MD: Dr. Jeaneen    Disease: Ph+B ALL  Current disease status: CR MRD Negative  Type of Transplant: RIC MUD Allo  Graft Source: Fresh PBSCs  Transplant Day: +1     Identifying Statement:  Patricia [name] is a 48 y.o. 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 Patricia [name] now presents for a matched unrelated donor stem cell transplant.      Interval History:  - No acute events overnight  - Afebrile, VSS  - POC glucose ranged from 291-399, received 15U SSI with an additional 4U lispro. Dex has stopped and glucose should start to trend down. Continue SSI and lantus  18U  - PO intake 1.9L. Weight decrease of about 2kg since admission.   - Will receive day +1 MTX    Review of Systems:  A full system review was performed and was negative except as noted in the above interval history.    Temp:  [36.4 ??C (97.5 ??F)-36.7 ??C (98.1 ??F)] 36.5 ??C (97.7 ??F)  Pulse:  [73-89] 82  Resp:  [16-20] 16  BP: (95-118)/(64-84) 118/76  MAP (mmHg):  [77-95] 90  SpO2:  [93 %-100 %] 100 %    I/O last 3 completed shifts:  In: 3259 [P.O.:1930; I.V.:1068; Blood:261]  Out: 1775 [Urine:1775]  I/O this shift:  In: 66 [I.V.:66]  Out: -     Last 5 Recorded Weights    06/07/24 1129 06/08/24 0630 06/08/24 2009 06/09/24 2023   Weight: 79.2 kg (174 lb 11.2 oz) 79.7 kg (175 lb 11.2 oz) 80.4 kg (177 lb 4.8 oz) 78.4 kg (172 lb 12.8 oz)    06/11/24 2027   Weight: 77.3 kg (170 lb 6.4 oz)     Weight change:     Test Results:   Reviewed in EPIC. Abnormal values discussed below.     Scheduled Meds:Scheduled Medications[1]  Continuous Infusions:Infusions Meds[2]  PRN Meds:.PRN Medications[3]    Physical Exam:   General : No acute distress noted.   Central venous access: Line clean, dry, intact. No erythema or drainage noted.   ENT: Moist mucous membranes. Oropharhynx without lesions, erythema or exudate.   Cardiovascular: Pulse normal rate, regularity and rhythm. S1 and S2 normal, without any murmur, rub, or gallop.  Lungs: Clear to auscultation bilaterally, without wheezes/crackles/rhonchi. Good air movement.   Skin: Warm, dry, intact. No rash noted.   Psychiatry: Alert and oriented to person, place, and time.   Gastrointestinal: Normoactive bowel sounds, abdomen soft, non-tender   Musculoskeletal/Extremities: Full range of motion in shoulder, elbow, hip knee, ankle, wrists and feet. No edema.   Neurologic: CNII-XII intact. Normal strength and sensation throughout    Assessment/Plan:    BMT: ALL  CR MRD Negative  HCT-CI (age adjusted) 22 (age, anxiety, reduced DLCO, HF (reduced EF), T2DM, CMV viremia)  Conditioning:.  1. Fludarabine 40 mg/m2 days -5, -4, -3, -2  2. Melphalan 100  mg/m2 day -1      Donor: 10/10, Female, ABO O-, CMV negative  Engraftment: G-CSF starting Day + 5 through WBC recovery (as defined as ANC 1.0 x 2 days or 3.0 x 1 day)  -Date of last G-CSF injection: TBD     GVHD prophylaxis:   1.Tacrolimus starting on day -3 (goal 5-10 ng/mL)  2. Methotrexate  5 mg/m2 IVP on days +1, +  3, +6 and +11  3.  ATG per [facility] standard dosing will not be included     Hem: Transfusion criteria: Transfuse 1 unit of PRBCs for hemoglobin <7 and 1 unit of platelets for Plt <10K or bleeding. [name] [name] does not have a history of transfusion reactions. Consent was obtained and documented on 06/05/24.    ID:   Prophylaxis:  -Antiviral: Valtrex  500 mg po BID started on admission and continue for 2 year      Letermovir  indicated as R/CMV + D/CMV - and with prior hx of CMV viremia, plan to continue on admission through at least day +200  -Antifungal: Fluconazole  400 mg po daily to start day 0 thorugh day +75  -Antibacterial: Levaquin  500 mg po daily to start day 0, continue while neutropenic  -PJP: Sulfa allergic. Dapsone  daily upon platelet engraftment, has tolerated this well in the past. Toxo PCR negative.   -11/7 stopped Dapsone  upon admission, restart with platelet engraftment.      Patient was screened for upper respiratory symptoms, including S/S of Covid-19. bmt prescreen symptoms: Patient reports upper respiratory symptoms.  bmtu rpp testing: RPP to be ordered and performed ahead of BMTU admission. Patient educated on continued need to inform medical team of any new symptoms.     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   Previously required Valcyte  treatment  -Continue Letermovir  and Valtrex  BID through day +200 post SCT.      Recurrent Acute Sinusitis  Recurrent acute sinusitis with nasal congestion, frontal headache, and low-grade fever. Responded well to Augmentin .  -11/7 current sx resolving, started Augmentin  10 days (10/30-11/9)     Hypogammaglobulinemia  Monthly IVIG - last on 05/16/2024  - 11/7 check weekly IgG levels     GI:   GERD prophylaxis:  -Pepcid  BID     Nausea:  -Anti-emetics per BMT protocol.      Diarrhea:  -Antidiarrheals per BMT protocol    Renal: Creatinine normal. No issues.      FEN:    Electrolyte replacement per protocol    Hepatic: Normal bilirubin. SOS prophylaxis not indicated with RIC regimen.     CV: Followed by [facility] Cardiology: Heart failure with mildly reduced ejection fraction (HFmrEF)   Hx of EF 45-50%, most recent Echo 05/23/24 improved to 55-60%  Hx of prolonged QTc, last EKG 489 follow closely with addition of QTc prolonging medications  -11/7 QTc 457. Check weekly EKGs (next on 11/14), added cardiac lytes   -11/8: 40 meq of K  -11/9: 20 meq of K     Hypertension: Home med Losartan  50 mg daily.     Pulm: No pulmonary symptoms currently. Reduced DLCO      Endocrine:   Menses suppression: Lupron  last 07/26/22.  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 At home uses Dexcom for monitoring.  - 11/7 ACHS and SSI while inpatient. Lantus  25 units in AM. Can consider using Dexcom next week if numbers similar to hospital glucometer results   - 11/8: holding Lantus  due to morning POC 89  - 11/9: Blood glucose range 131 -189, resumed Lantus  at lower dose 10 Units  - 11/11: BG 200-400s in setting of steroids. Increased Lantus  to 15u.  - 11/12: BG 300s. Increased Lantus  to 18u.     Neuro/Pain:  - PRN tramadol  50 mg added. No oxy as this give patient migraines   - 11/8:  Tramadol  was not helping, Morphine  with mild relief. Ordered dilaudid  0.5mg  q8 prn x 2 days for severe pain, removed tramadol  prn.  - 11/11: Tramadol  50mg  Q6H ordered PRN with goal to transition to oral medications for pain control.  - 11/12: Discontinued Dilaudid . Manage chronic hip/back pain with Tramadol  q6h PRN + heat therapy.     Psych: Followed by Palliative Care     Anxiety: Home med: Lexapro  10 mg daily   Reconnected with her local therapist   - If pt continues to endorse anxiety, may increase Lexapro  to 20 mg daily  - 11/7 added PRN ativan  0.5 mg Q8     Caregivers: Lives in [place], [place]  Primary caregiver: Jeanie Gan (mom)- first 30 days at [facility] then home if doing well will stay longer if medically needed  Secondary caregiver: Devaughn Ranger (husband)     Consults: While inpatient, consider consults to music therapy, integrative medicine and massage therapy.     Edd GORMAN Bough, AGNP  [facility]           [1]    calcium  gluconate  2 g Intravenous Once    cetirizine   10 mg Oral Daily    escitalopram  oxalate  10 mg Oral Daily    famotidine   20 mg Oral BID    [START ON 06/24/2024] filgrastim   480 mcg Subcutaneous Q24H    fluconazole   400 mg Oral Daily    heparin , porcine (PF)  2 mL Intravenous Mon,Wed,Fri    insulin  glargine  18 Units Subcutaneous Daily    insulin  lispro  0-20 Units Subcutaneous TID AC    letermovir   480 mg Oral Nightly    levoFLOXacin   500 mg Oral Daily    losartan   50 mg Oral Daily    methotrexate  (Preservative Free)  5 mg/m2 (Treatment Plan Adjusted) Intravenous Once    [START ON 06/15/2024] methotrexate  (Preservative Free)  5 mg/m2 (Treatment Plan Adjusted) Intravenous Once    [START ON 06/18/2024] methotrexate  (Preservative Free)  5 mg/m2 (Treatment Plan Adjusted) Intravenous Once    [START ON 06/23/2024] methotrexate  (Preservative Free)  5 mg/m2 (Treatment Plan Adjusted) Intravenous Once    tacrolimus  2 mg Oral BID    valACYclovir   500 mg Oral BID   [2]    IP okay to treat      sodium chloride  20 mL/hr (06/07/24 1326)   [3] albuterol , aluminum-magnesium  hydroxide-simethicone, CETAPHIL, dexAMETHasone , dextrose  in water, diphenhydrAMINE , emollient combination no.92, EPINEPHrine IM, famotidine  (PEPCID ) IV, glucagon , glucose, IP okay to treat, lidocaine , lidocaine , loperamide , loperamide , LORazepam , magnesium  sulfate, methylPREDNISolone  sodium succinate , potassium chloride  in water, prochlorperazine  **OR** prochlorperazine , sodium chloride  0.9%, traMADol 

## 2024-06-13 NOTE — Consults (Signed)
 Nutrition Consult completed 06/13/24. Dietitian will continue to follow Pt based on care level.    Asberry Rimes, MS, RD, CSO, LDN  Pager # [phone]

## 2024-06-13 NOTE — Plan of Care (Signed)
 Pt day +1 from allo SCT. Vitals stable. Denies pain. Received PRN compazine  for nausea. Intermittently hyperglycemic, given sliding scale lispro and some additional units per provider order. Received schedule methotrexate  with no complications.

## 2024-06-13 NOTE — Plan of Care (Signed)
 Pt day +1 from Allo SCT. Afebrile, other VSS on room air. PRN tramadol  given once for back pain. Labs drawn, Mag replacement infusing per orders. Pt free from falls/injuries this shift.     Problem: Adult Inpatient Plan of Care  Goal: Absence of Hospital-Acquired Illness or Injury  Intervention: Identify and Manage Fall Risk  Recent Flowsheet Documentation  Taken 06/12/2024 2029 by Theophilus Begun, RN  Safety Interventions:   bleeding precautions   environmental modification   infection management   isolation precautions   lighting adjusted for tasks/safety   low bed   neutropenic precautions   nonskid shoes/slippers when out of bed  Intervention: Prevent Infection  Recent Flowsheet Documentation  Taken 06/12/2024 2029 by Theophilus Begun, RN  Infection Prevention:   cohorting utilized   environmental surveillance performed   equipment surfaces disinfected   hand hygiene promoted   personal protective equipment utilized   rest/sleep promoted   single patient room provided   visitors restricted/screened     Problem: Infection  Goal: Absence of Infection Signs and Symptoms  Intervention: Prevent or Manage Infection  Recent Flowsheet Documentation  Taken 06/12/2024 2029 by Theophilus Begun, RN  Infection Management: aseptic technique maintained  Isolation Precautions: protective precautions maintained     Problem: Stem Cell/Bone Marrow Transplant  Goal: Blood Counts Within Acceptable Range  Intervention: Monitor and Manage Hematologic Symptoms  Recent Flowsheet Documentation  Taken 06/12/2024 2029 by Theophilus Begun, RN  Bleeding Precautions:   blood pressure closely monitored   coagulation study results reviewed   gentle oral care promoted   monitored for signs of bleeding   foot protection facilitated  Goal: Absence of Infection  Intervention: Prevent and Manage Infection  Recent Flowsheet Documentation  Taken 06/12/2024 2029 by Theophilus Begun, RN  Infection Management: aseptic technique maintained  Infection Prevention:   cohorting utilized   environmental surveillance performed   equipment surfaces disinfected   hand hygiene promoted   personal protective equipment utilized   rest/sleep promoted   single patient room provided   visitors restricted/screened  Isolation Precautions: protective precautions maintained

## 2024-06-14 LAB — CBC W/ AUTO DIFF
BASOPHILS ABSOLUTE COUNT: 0 10*9/L (ref 0.0–0.1)
BASOPHILS RELATIVE PERCENT: 0.5 %
EOSINOPHILS ABSOLUTE COUNT: 0 10*9/L (ref 0.0–0.5)
EOSINOPHILS RELATIVE PERCENT: 5.2 %
HEMATOCRIT: 27.6 % — ABNORMAL LOW (ref 34.0–44.0)
HEMOGLOBIN: 9.6 g/dL — ABNORMAL LOW (ref 11.3–14.9)
LYMPHOCYTES ABSOLUTE COUNT: 0.1 10*9/L — ABNORMAL LOW (ref 1.1–3.6)
LYMPHOCYTES RELATIVE PERCENT: 9.4 %
MEAN CORPUSCULAR HEMOGLOBIN CONC: 34.6 g/dL (ref 32.0–36.0)
MEAN CORPUSCULAR HEMOGLOBIN: 32.8 pg — ABNORMAL HIGH (ref 25.9–32.4)
MEAN CORPUSCULAR VOLUME: 94.7 fL (ref 77.6–95.7)
MEAN PLATELET VOLUME: 9.9 fL (ref 6.8–10.7)
MONOCYTES ABSOLUTE COUNT: 0 10*9/L — ABNORMAL LOW (ref 0.3–0.8)
MONOCYTES RELATIVE PERCENT: 2.6 %
NEUTROPHILS ABSOLUTE COUNT: 0.5 10*9/L — ABNORMAL LOW (ref 1.8–7.8)
NEUTROPHILS RELATIVE PERCENT: 82.3 %
PLATELET COUNT: 92 10*9/L — ABNORMAL LOW (ref 150–450)
RED BLOOD CELL COUNT: 2.92 10*12/L — ABNORMAL LOW (ref 3.95–5.13)
RED CELL DISTRIBUTION WIDTH: 14.6 % (ref 12.2–15.2)
WBC ADJUSTED: 0.6 10*9/L — ABNORMAL LOW (ref 3.6–11.2)

## 2024-06-14 LAB — MAGNESIUM: MAGNESIUM: 1.6 mg/dL (ref 1.6–2.6)

## 2024-06-14 LAB — BASIC METABOLIC PANEL
ANION GAP: 12 mmol/L (ref 5–14)
BLOOD UREA NITROGEN: 15 mg/dL (ref 9–23)
BUN / CREAT RATIO: 27
CALCIUM: 9.1 mg/dL (ref 8.7–10.4)
CHLORIDE: 103 mmol/L (ref 98–107)
CO2: 28 mmol/L (ref 20.0–31.0)
CREATININE: 0.55 mg/dL (ref 0.55–1.02)
EGFR CKD-EPI (2021) FEMALE: >90 mL/min/1.73m2 (ref >=60)
GLUCOSE RANDOM: 212 mg/dL — ABNORMAL HIGH (ref 70–179)
POTASSIUM: 4.3 mmol/L (ref 3.4–4.8)
SODIUM: 143 mmol/L (ref 135–145)

## 2024-06-14 LAB — TACROLIMUS LEVEL, TROUGH: TACROLIMUS, TROUGH: 9.4 ng/mL (ref 5.0–15.0)

## 2024-06-14 MED ADMIN — famotidine (PEPCID) tablet 20 mg: 20 mg | ORAL | @ 01:00:00

## 2024-06-14 MED ADMIN — famotidine (PEPCID) tablet 20 mg: 20 mg | ORAL | @ 14:00:00

## 2024-06-14 MED ADMIN — escitalopram oxalate (LEXAPRO) tablet 10 mg: 10 mg | ORAL | @ 14:00:00

## 2024-06-14 MED ADMIN — losartan (COZAAR) tablet 50 mg: 50 mg | ORAL | @ 14:00:00

## 2024-06-14 MED ADMIN — fluconazole (DIFLUCAN) tablet 400 mg: 400 mg | ORAL | @ 14:00:00 | Stop: 2024-07-12

## 2024-06-14 MED ADMIN — letermovir (PREVYMIS) tablet 480 mg: 480 mg | ORAL | @ 01:00:00

## 2024-06-14 MED ADMIN — heparin, porcine (PF) 100 unit/mL injection 2 mL: 2 mL | INTRAVENOUS | @ 05:00:00

## 2024-06-14 MED ADMIN — magnesium sulfate 2gm/50mL IVPB: 2 g | INTRAVENOUS | @ 07:00:00

## 2024-06-14 MED ADMIN — magnesium sulfate 2gm/50mL IVPB: 2 g | INTRAVENOUS | @ 09:00:00

## 2024-06-14 MED ADMIN — insulin glargine (LANTUS) injection BASAL 18 Units: 18 [IU] | SUBCUTANEOUS | @ 14:00:00

## 2024-06-14 MED ADMIN — cetirizine (ZYRTEC) tablet 10 mg: 10 mg | ORAL | @ 14:00:00

## 2024-06-14 MED ADMIN — insulin lispro (HumaLOG) injection CORRECTIONAL 0-20 Units: 0-20 [IU] | SUBCUTANEOUS | @ 23:00:00

## 2024-06-14 MED ADMIN — insulin lispro (HumaLOG) injection CORRECTIONAL 0-20 Units: 0-20 [IU] | SUBCUTANEOUS | @ 19:00:00

## 2024-06-14 MED ADMIN — valACYclovir (VALTREX) tablet 500 mg: 500 mg | ORAL | @ 14:00:00

## 2024-06-14 MED ADMIN — valACYclovir (VALTREX) tablet 500 mg: 500 mg | ORAL | @ 01:00:00

## 2024-06-14 MED ADMIN — levoFLOXacin (LEVAQUIN) tablet 500 mg: 500 mg | ORAL | @ 14:00:00 | Stop: 2024-07-12

## 2024-06-14 MED ADMIN — tacrolimus (PROGRAF) capsule 2 mg: 2 mg | ORAL | @ 01:00:00

## 2024-06-14 MED ADMIN — tacrolimus (PROGRAF) capsule 2 mg: 2 mg | ORAL | @ 14:00:00 | Stop: 2024-06-14

## 2024-06-14 MED ADMIN — traMADol (ULTRAM) tablet 50 mg: 50 mg | ORAL | @ 01:00:00

## 2024-06-14 MED ADMIN — prochlorperazine (COMPAZINE) tablet 10 mg: 10 mg | ORAL | @ 14:00:00

## 2024-06-14 MED ADMIN — prochlorperazine (COMPAZINE) tablet 10 mg: 10 mg | ORAL | @ 20:00:00

## 2024-06-14 NOTE — Plan of Care (Signed)
 Shift Summary  Pt A & O X 4. VSS. Denies N/V/D. Afebrile this shift.   PRN traMADol  was given for back and CVAD site pain, resulting in improved comfort and sleep.  Magnesium  sulfate was administered twice PRN in response to lab results showing low magnesium .  CBC w/ Differential and Type and Screen were completed, with results available for review.  Safety and infection prevention interventions were maintained, and no hospital-acquired injuries were documented. Fall precautions, safety precautions maintained as per order.  The patient remained stable throughout the shift with no acute changes in neurological or psychosocial status.    Absence of Hospital-Acquired Illness or Injury: No new hospital-acquired injuries were documented during the shift, and safety interventions such as fall reduction, infection management, and neutropenic precautions were maintained throughout the night. Bruising was noted on skin assessment, but no changes or new abnormalities were reported.    Optimal Comfort and Wellbeing: Back and CVAD site pain was reported as a 3/10 at the start of the shift and decreased to 0/10 after administration of traMADol , with the patient later observed sleeping comfortably.    Readiness for Transition of Care: Unplanned readmission score remained stable with only a slight increase, and the patient maintained mobility and activity levels without limitation; no acute neurological or psychosocial changes were noted.    Absence of Infection Signs and Symptoms: Temperature, respiratory rate, and SpO2 remained stable, and infection prevention measures were consistently implemented; SOFA and sepsis risk scores remained low and stable throughout the shift.

## 2024-06-14 NOTE — Consults (Signed)
 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 She is continuing tacrolimus and MTX for GVHD prophylaxis.     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     Date of Transplant: 06/12/24      Prior Dosing Information: see table below     Source(s) of information used to determine prior to admission dosing: MAR    Goals:  Therapeutic Drug Levels  Tacrolimus trough goal: 5-10 ng/mL    Additional Clinical Monitoring/Outcomes  Monitor renal function (SCr and urine output) and liver function (LFTs)  Monitor for signs/symptoms of adverse events (e.g., hyperglycemia, hyperkalemia, hypomagnesemia, hypertension, headache, tremor)    Previous Lab Results:  Tacrolimus, Trough   Date Value Ref Range Status   06/14/2024 9.4 5.0 - 15.0 ng/mL Final   06/12/2024 10.7 5.0 - 15.0 ng/mL Final   06/11/2024 16.1 (H) 5.0 - 15.0 ng/mL Final     Creatinine   Date Value Ref Range Status   06/13/2024 0.55 0.55 - 1.02 mg/dL Final   88/87/7974 9.47 (L) 0.55 - 1.02 mg/dL Final   88/88/7974 9.46 (L) 0.55 - 1.02 mg/dL Final      Result:  Tacrolimus level from today was drawn ~1 hours late     Pharmacokinetic Considerations and Significant Drug Interactions:  Concurrent CYP3A4 substrates/inhibitors: fluconazole  (moderate inhibitor, started 11/12), letermovir  (moderate inhibitor, initiated 05/2023)    Assessment/Plan:  Recommendation(s)  Trough likely closer to 10 ng/mL since it was drawn ~1 hour late after 3 doses of 2 mg BID  Fluconazole  recently started on 11/12 for fungal PPX. As fluconazole  reaches steady state, will continue to affect tacrolimus levels. Letermovir  has been at steady state and should not have a continued affect on tacrolimus levels  SCr stable/WNL  Bili/LFTs from 11/12 also stable/WNL  Expect tacrolimus to continue to increase as fluconazole  reaches steady state over next few days. Given the above, plan to hold tonight's dose and resume tacrolimus at a decreased dose of 1.5 mg twice daily on 11/15.    Follow-up  Next level has been ordered on 11/16 at 0800.   A pharmacist will continue to monitor and recommend levels as appropriate    Longitudinal Dose Monitoring:  Date Dose (mg), Route AM Scr (mg/dL) Level  (ng/mL) Key Drug Interactions   11/9 3.5/3.5 0.51  Letermovir    11/10 3.5/3.5 0.44  Letermovir    11/11 2.5/--- 0.51 16.1 Letermovir    11/12 ---/--- 0.53 10.7 Letermovir , Fluconazole  started   11/13 2/2 0.52 -- Letermovir , Fluconazole    11/14 2/-- 0.55 9.4 Letermovir , Fluconazole      Please page service pharmacist with questions/clarifications.    Lavanda Dings, PharmD  Inpatient Hematology/Oncology Pharmacist

## 2024-06-14 NOTE — Plan of Care (Signed)
 Pt day +2 from allo SCT. Vitals stable. Denies pain. Received compazine  x2 for nausea. Intermittently hyperglycemic. Reported one soft BM today.

## 2024-06-14 NOTE — Progress Notes (Signed)
 BONE MARROW TRANSPLANT AND CELLULAR THERAPY PROGRESS NOTE    [name]  MRN: [id number]  Encounter Date: 06/14/24    Referring physician:  Dr. Toribio Bunker  BMT Attending MD: Dr. Jeaneen    Disease: Ph+B ALL  Current disease status: CR MRD Negative  Type of Transplant: RIC MUD Allo  Graft Source: Fresh PBSCs  Transplant Day: +2     Identifying Statement:  Patricia [name] is a 48 y.o. 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 Patricia [name] now presents for a matched unrelated donor stem cell transplant.      Interval History:  - No acute events overnight  - Endorses mild nausea this morning without emesis. Also reports one loose stool this morning, will monitor and send C. Diff as indicated  - POC glucose ranged from 152-358, received 15U SSI. Continue SSI and lantus  18U  - PO intake 1.4L. Weight stable.       Review of Systems:  A full system review was performed and was negative except as noted in the above interval history.    Temp:  [36.5 ??C (97.7 ??F)-36.8 ??C (98.2 ??F)] 36.6 ??C (97.9 ??F)  Pulse:  [74-91] 79  Resp:  [16-18] 18  BP: (110-135)/(70-84) 122/77  MAP (mmHg):  [88-98] 92  SpO2:  [97 %-100 %] 97 %    I/O last 3 completed shifts:  In: 2074 [P.O.:1900; I.V.:174]  Out: 1625 [Urine:1625]  No intake/output data recorded.    Last 5 Recorded Weights    06/08/24 0630 06/08/24 2009 06/09/24 2023 06/11/24 2027   Weight: 79.7 kg (175 lb 11.2 oz) 80.4 kg (177 lb 4.8 oz) 78.4 kg (172 lb 12.8 oz) 77.3 kg (170 lb 6.4 oz)    06/13/24 2017   Weight: 78.5 kg (173 lb)     Weight change:     Test Results:   Reviewed in EPIC. Abnormal values discussed below.     Scheduled Meds:Scheduled Medications[1]  Continuous Infusions:Infusions Meds[2]  PRN Meds:.PRN Medications[3]    Physical Exam: No change from 06/13/24  General : Lying in bed, fatigued appearing. No acute distress noted.   Central venous access: Line clean, dry, intact. No erythema or drainage noted.   ENT: Moist mucous membranes. Oropharhynx without lesions, erythema or exudate.   Cardiovascular: Pulse normal rate, regularity and rhythm. S1 and S2 normal, without any murmur, rub, or gallop.  Lungs: Clear to auscultation bilaterally, without wheezes/crackles/rhonchi. Good air movement.   Skin: Warm, dry, intact. No rash noted.   Psychiatry: Alert and oriented to person, place, and time.   Gastrointestinal: Normoactive bowel sounds, abdomen soft, non-tender   Musculoskeletal/Extremities: Full range of motion in shoulder, elbow, hip knee, ankle, wrists and feet. No edema.   Neurologic: CNII-XII intact. Normal strength and sensation throughout    Assessment/Plan:    BMT: ALL  CR MRD Negative  HCT-CI (age adjusted) 41 (age, anxiety, reduced DLCO, HF (reduced EF), T2DM, CMV viremia)  Conditioning:.  1. Fludarabine 40 mg/m2 days -5, -4, -3, -2  2. Melphalan 100  mg/m2 day -1      Donor: 10/10, Female, ABO O-, CMV negative  Engraftment: G-CSF starting Day + 5 through WBC recovery (as defined as ANC 1.0 x 2 days or 3.0 x 1 day)  -Date of last G-CSF injection: TBD     GVHD prophylaxis:   1.Tacrolimus starting on day -3 (goal 5-10 ng/mL)  2. Methotrexate  5 mg/m2 IVP on days +1, +3, +6 and +11  3.  ATG per [facility] standard dosing will not be included     Hem: Transfusion criteria: Transfuse 1 unit of PRBCs for hemoglobin <7 and 1 unit of platelets for Plt <10K or bleeding. Patricia [name]. Patricia [name] does not have a history of transfusion reactions. Consent was obtained and documented on 06/05/24.    ID:   Prophylaxis:  -Antiviral: Valtrex  500 mg po BID started on admission and continue for 2 year      Letermovir  indicated as R/CMV + D/CMV - and with prior hx of CMV viremia, plan to continue on admission through at least day +200  -Antifungal: Fluconazole  400 mg po daily to start day 0 thorugh day +75  -Antibacterial: Levaquin  500 mg po daily to start day 0, continue while neutropenic  -PJP: Sulfa allergic. Dapsone  daily upon platelet engraftment, has tolerated this well in the past. Toxo PCR negative.   -11/7 stopped Dapsone  upon admission, restart with platelet engraftment.      Patient was screened for upper respiratory symptoms, including S/S of Covid-19. bmt prescreen symptoms: Patient reports upper respiratory symptoms.  bmtu rpp testing: RPP to be ordered and performed ahead of BMTU admission. Patient educated on continued need to inform medical team of any new symptoms.     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   Previously required Valcyte  treatment  -Continue Letermovir  and Valtrex  BID through day +200 post SCT.      Recurrent Acute Sinusitis  Recurrent acute sinusitis with nasal congestion, frontal headache, and low-grade fever. Responded well to Augmentin .  -11/7 current sx resolving, started Augmentin  10 days (10/30-11/9)     Hypogammaglobulinemia  Monthly IVIG - last on 05/16/2024  - 11/7 check weekly IgG levels     GI:   GERD prophylaxis:  -Pepcid  BID     Nausea:  -Anti-emetics per BMT protocol.   - 06/14/24: Zyprexa  5 mg nightly      Diarrhea:  -Antidiarrheals per BMT protocol    Renal: Creatinine normal. No issues.      FEN:    Electrolyte replacement per protocol    Hepatic: Normal bilirubin. SOS prophylaxis not indicated with RIC regimen.     CV: Followed by [facility] Cardiology: Heart failure with mildly reduced ejection fraction (HFmrEF)   Hx of EF 45-50%, most recent Echo 05/23/24 improved to 55-60%  Hx of prolonged QTc, last EKG 489 follow closely with addition of QTc prolonging medications  -11/7 QTc 457. Check weekly EKGs (next on 11/14), added cardiac lytes   -11/8: 40 meq of K  -11/9: 20 meq of K     Hypertension: Home med Losartan  50 mg daily.     Pulm: No pulmonary symptoms currently. Reduced DLCO      Endocrine:   Menses suppression: Lupron  last 07/26/22.  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 At home uses Dexcom for monitoring.  - 11/7 ACHS and SSI while inpatient. Lantus  25 units in AM. Can consider using Dexcom next week if numbers similar to hospital glucometer results   - 11/8: holding Lantus  due to morning POC 89  - 11/9: Blood glucose range 131 -189, resumed Lantus  at lower dose 10 Units  - 11/11: BG 200-400s in setting of steroids. Increased Lantus  to 15u.  - 11/12: BG 300s. Increased Lantus  to 18u.     Neuro/Pain:  - PRN tramadol  50 mg added. No oxy as this give patient migraines   -  11/8: Tramadol  was not helping, Morphine  with mild relief. Ordered dilaudid  0.5mg  q8 prn x 2 days for severe pain, removed tramadol  prn.  - 11/11: Tramadol  50mg  Q6H ordered PRN with goal to transition to oral medications for pain control.  - 11/12: Discontinued Dilaudid . Manage chronic hip/back pain with Tramadol  q6h PRN + heat therapy.     Psych: Followed by Palliative Care     Anxiety: Home med: Lexapro  10 mg daily   Reconnected with her local therapist   - If pt continues to endorse anxiety, may increase Lexapro  to 20 mg daily  - 11/7 added PRN ativan  0.5 mg Q8     Caregivers: Lives in [place], [place]  Primary caregiver: Jeanie Dicarlo (mom)- first 30 days at [facility] then home if doing well will stay longer if medically needed  Secondary caregiver: Devaughn Ranger (husband)     Consults: While inpatient, consider consults to music therapy, integrative medicine and massage therapy.     Edd GORMAN Bough, AGNP  [facility]             [1]    cetirizine   10 mg Oral Daily    escitalopram  oxalate  10 mg Oral Daily    famotidine   20 mg Oral BID    [START ON 06/24/2024] filgrastim   480 mcg Subcutaneous Q24H    fluconazole   400 mg Oral Daily    heparin , porcine (PF)  2 mL Intravenous Mon,Wed,Fri    insulin  glargine  18 Units Subcutaneous Daily    insulin  lispro  0-20 Units Subcutaneous TID AC    letermovir   480 mg Oral Nightly    levoFLOXacin   500 mg Oral Daily    losartan   50 mg Oral Daily    [START ON 06/15/2024] methotrexate  (Preservative Free)  5 mg/m2 (Treatment Plan Adjusted) Intravenous Once    [START ON 06/18/2024] methotrexate  (Preservative Free)  5 mg/m2 (Treatment Plan Adjusted) Intravenous Once    [START ON 06/23/2024] methotrexate  (Preservative Free)  5 mg/m2 (Treatment Plan Adjusted) Intravenous Once    tacrolimus  2 mg Oral BID    valACYclovir   500 mg Oral BID   [2]    IP okay to treat      sodium chloride  20 mL/hr (06/07/24 1326)   [3] albuterol , aluminum-magnesium  hydroxide-simethicone, CETAPHIL, dexAMETHasone , dextrose  in water, diphenhydrAMINE , emollient combination no.92, EPINEPHrine IM, famotidine  (PEPCID ) IV, glucagon , glucose, IP okay to treat, lidocaine , lidocaine , loperamide , loperamide , LORazepam , magnesium  sulfate, methylPREDNISolone  sodium succinate , potassium chloride  in water, prochlorperazine  **OR** prochlorperazine , sodium chloride  0.9%, traMADol 

## 2024-06-15 LAB — CBC W/ AUTO DIFF
BASOPHILS ABSOLUTE COUNT: 0 10*9/L (ref 0.0–0.1)
BASOPHILS RELATIVE PERCENT: 0.4 %
EOSINOPHILS ABSOLUTE COUNT: 0 10*9/L (ref 0.0–0.5)
EOSINOPHILS RELATIVE PERCENT: 8 %
HEMATOCRIT: 29.7 % — ABNORMAL LOW (ref 34.0–44.0)
HEMOGLOBIN: 10.5 g/dL — ABNORMAL LOW (ref 11.3–14.9)
LYMPHOCYTES ABSOLUTE COUNT: 0 10*9/L — ABNORMAL LOW (ref 1.1–3.6)
LYMPHOCYTES RELATIVE PERCENT: 7.5 %
MEAN CORPUSCULAR HEMOGLOBIN CONC: 35.3 g/dL (ref 32.0–36.0)
MEAN CORPUSCULAR HEMOGLOBIN: 33.2 pg — ABNORMAL HIGH (ref 25.9–32.4)
MEAN CORPUSCULAR VOLUME: 94.1 fL (ref 77.6–95.7)
MEAN PLATELET VOLUME: 10 fL (ref 6.8–10.7)
MONOCYTES ABSOLUTE COUNT: 0 10*9/L — ABNORMAL LOW (ref 0.3–0.8)
MONOCYTES RELATIVE PERCENT: 1.5 %
NEUTROPHILS ABSOLUTE COUNT: 0.5 10*9/L — ABNORMAL LOW (ref 1.8–7.8)
NEUTROPHILS RELATIVE PERCENT: 82.6 %
PLATELET COUNT: 92 10*9/L — ABNORMAL LOW (ref 150–450)
RED BLOOD CELL COUNT: 3.15 10*12/L — ABNORMAL LOW (ref 3.95–5.13)
RED CELL DISTRIBUTION WIDTH: 14.8 % (ref 12.2–15.2)
WBC ADJUSTED: 0.6 10*9/L — ABNORMAL LOW (ref 3.6–11.2)

## 2024-06-15 LAB — BASIC METABOLIC PANEL
ANION GAP: 11 mmol/L (ref 5–14)
BLOOD UREA NITROGEN: 16 mg/dL (ref 9–23)
BUN / CREAT RATIO: 33
CALCIUM: 9 mg/dL (ref 8.7–10.4)
CHLORIDE: 103 mmol/L (ref 98–107)
CO2: 27 mmol/L (ref 20.0–31.0)
CREATININE: 0.48 mg/dL — ABNORMAL LOW (ref 0.55–1.02)
EGFR CKD-EPI (2021) FEMALE: >90 mL/min/1.73m2 (ref >=60)
GLUCOSE RANDOM: 199 mg/dL — ABNORMAL HIGH (ref 70–179)
POTASSIUM: 4.3 mmol/L (ref 3.4–4.8)
SODIUM: 141 mmol/L (ref 135–145)

## 2024-06-15 LAB — SLIDE REVIEW

## 2024-06-15 LAB — MAGNESIUM: MAGNESIUM: 1.7 mg/dL (ref 1.6–2.6)

## 2024-06-15 MED ADMIN — famotidine (PEPCID) tablet 20 mg: 20 mg | ORAL | @ 13:00:00

## 2024-06-15 MED ADMIN — famotidine (PEPCID) tablet 20 mg: 20 mg | ORAL | @ 02:00:00

## 2024-06-15 MED ADMIN — escitalopram oxalate (LEXAPRO) tablet 10 mg: 10 mg | ORAL | @ 13:00:00

## 2024-06-15 MED ADMIN — losartan (COZAAR) tablet 50 mg: 50 mg | ORAL | @ 13:00:00

## 2024-06-15 MED ADMIN — fluconazole (DIFLUCAN) tablet 400 mg: 400 mg | ORAL | @ 13:00:00 | Stop: 2024-07-12

## 2024-06-15 MED ADMIN — methotrexate (Preservative Free) injection: 5 mg/m2 | INTRAVENOUS | @ 18:00:00 | Stop: 2024-06-15

## 2024-06-15 MED ADMIN — letermovir (PREVYMIS) tablet 480 mg: 480 mg | ORAL | @ 02:00:00

## 2024-06-15 MED ADMIN — magnesium sulfate 2gm/50mL IVPB: 2 g | INTRAVENOUS | @ 11:00:00

## 2024-06-15 MED ADMIN — magnesium sulfate 2gm/50mL IVPB: 2 g | INTRAVENOUS | @ 09:00:00

## 2024-06-15 MED ADMIN — insulin glargine (LANTUS) injection BASAL 18 Units: 18 [IU] | SUBCUTANEOUS | @ 13:00:00

## 2024-06-15 MED ADMIN — cetirizine (ZYRTEC) tablet 10 mg: 10 mg | ORAL | @ 13:00:00

## 2024-06-15 MED ADMIN — insulin lispro (HumaLOG) injection CORRECTIONAL 0-20 Units: 0-20 [IU] | SUBCUTANEOUS | @ 22:00:00

## 2024-06-15 MED ADMIN — insulin lispro (HumaLOG) injection CORRECTIONAL 0-20 Units: 0-20 [IU] | SUBCUTANEOUS | @ 18:00:00

## 2024-06-15 MED ADMIN — insulin lispro (HumaLOG) injection CORRECTIONAL 0-20 Units: 0-20 [IU] | SUBCUTANEOUS | @ 13:00:00

## 2024-06-15 MED ADMIN — valACYclovir (VALTREX) tablet 500 mg: 500 mg | ORAL | @ 02:00:00

## 2024-06-15 MED ADMIN — valACYclovir (VALTREX) tablet 500 mg: 500 mg | ORAL | @ 13:00:00

## 2024-06-15 MED ADMIN — levoFLOXacin (LEVAQUIN) tablet 500 mg: 500 mg | ORAL | @ 13:00:00 | Stop: 2024-07-12

## 2024-06-15 MED ADMIN — OLANZapine (ZYPREXA) tablet 5 mg: 5 mg | ORAL | @ 02:00:00

## 2024-06-15 MED ADMIN — tacrolimus (PROGRAF) 1.5mg combo product: 1.5 mg | ORAL | @ 13:00:00

## 2024-06-15 MED ADMIN — sodium chloride 0.9% (NS) bolus 1,000 mL: 1000 mL | INTRAVENOUS | @ 16:00:00 | Stop: 2024-06-15

## 2024-06-15 NOTE — Progress Notes (Signed)
 BONE MARROW TRANSPLANT AND CELLULAR THERAPY PROGRESS NOTE    [name]  MRN: [id number]  Encounter Date: 06/15/24    Referring physician:  Dr. Toribio Bunker  BMT Attending MD: Dr. Jeaneen    Disease: Ph+B ALL  Current disease status: CR MRD Negative  Type of Transplant: RIC MUD Allo  Graft Source: Fresh PBSCs  Transplant Day: +3     Identifying Statement:  Patricia [name] is a 48 y.o. 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 Tacori now presents for a matched unrelated donor stem cell transplant.      Interval History:  - She had fall in bathroom around 2am overnight (feeling lightheaded the time she was falling) hit back of her head. Stat CTH w/o contrast (-), no neuro deficit on exam, feeling well thins morning, denies N/V, HA or dizziness or lightheadedness. Monitor closely  - Reports one loose stool yesterday and no BM since then, will monitor closely  - POC glucose ranged from 248-385, had completed dex.  Continue SSI and lantus  18U in am, monitor blood glucose closely, adjust insulin  as needed  - PO intake 1.8L. Weight about 1.5kg down from admission. 1L of NS bolus given in the settings lightheaded with a fall overnight. Monitor closely       Review of Systems:  A full system review was performed and was negative except as noted in the above interval history.    Temp:  [36.5 ??C (97.7 ??F)-36.8 ??C (98.2 ??F)] 36.7 ??C (98.1 ??F)  Pulse:  [65-107] 107  Resp:  [16-18] 16  BP: (102-133)/(68-88) 107/86  MAP (mmHg):  [80-100] 94  SpO2:  [95 %-99 %] 99 %    I/O last 3 completed shifts:  In: 1663 [P.O.:1663]  Out: 4125 [Urine:4125]  No intake/output data recorded.    Last 5 Recorded Weights    06/08/24 2009 06/09/24 2023 06/11/24 2027 06/13/24 2017   Weight: 80.4 kg (177 lb 4.8 oz) 78.4 kg (172 lb 12.8 oz) 77.3 kg (170 lb 6.4 oz) 78.5 kg (173 lb)    06/14/24 1945   Weight: 77.7 kg (171 lb 6.4 oz)     Weight change: -0.726 kg (-1 lb 9.6 oz)    Test Results:   Reviewed in EPIC. Abnormal values discussed below.     Scheduled Meds:Scheduled Medications[1]  Continuous Infusions:Infusions Meds[2]  PRN Meds:.PRN Medications[3]    Physical Exam: No change from 06/13/24  General : Lying in bed, fatigued appearing. No acute distress noted.   Central venous access: Line clean, dry, intact. No erythema or drainage noted.   ENT: Moist mucous membranes. Oropharhynx without lesions, erythema or exudate.   Cardiovascular: Pulse normal rate, regularity and rhythm. S1 and S2 normal, without any murmur, rub, or gallop.  Lungs: Clear to auscultation bilaterally, without wheezes/crackles/rhonchi. Good air movement.   Skin: Warm, dry, intact. No rash noted.   Psychiatry: Alert and oriented to person, place, and time.   Gastrointestinal: Normoactive bowel sounds, abdomen soft, non-tender   Musculoskeletal/Extremities: Full range of motion in shoulder, elbow, hip knee, ankle, wrists and feet. No edema.   Neurologic: CNII-XII intact. Normal strength and sensation throughout    Assessment/Plan:    BMT: ALL  CR MRD Negative  HCT-CI (age adjusted) 16 (age, anxiety, reduced DLCO, HF (reduced EF), T2DM, CMV viremia)  Conditioning:.  1. Fludarabine 40 mg/m2 days -5, -4, -3, -2  2. Melphalan 100  mg/m2 day -1      Donor: 10/10, Female, ABO O-,  CMV negative  Engraftment: G-CSF starting Day + 5 through WBC recovery (as defined as ANC 1.0 x 2 days or 3.0 x 1 day)  -Date of last G-CSF injection: TBD     GVHD prophylaxis:   1.Tacrolimus starting on day -3 (goal 5-10 ng/mL)  2. Methotrexate  5 mg/m2 IVP on days +1, +3, +6 and +11  3.  ATG per [facility] standard dosing will not be included     Hem: Transfusion criteria: Transfuse 1 unit of PRBCs for hemoglobin <7 and 1 unit of platelets for Plt <10K or bleeding. [name] [name] does not have a history of transfusion reactions. Consent was obtained and documented on 06/05/24.    ID:   Prophylaxis:  -Antiviral: Valtrex  500 mg po BID started on admission and continue for 2 year      Letermovir  indicated as R/CMV + D/CMV - and with prior hx of CMV viremia, plan to continue on admission through at least day +200  -Antifungal: Fluconazole  400 mg po daily to start day 0 thorugh day +75  -Antibacterial: Levaquin  500 mg po daily to start day 0, continue while neutropenic  -PJP: Sulfa allergic. Dapsone  daily upon platelet engraftment, has tolerated this well in the past. Toxo PCR negative.   -11/7 stopped Dapsone  upon admission, restart with platelet engraftment.      Patient was screened for upper respiratory symptoms, including S/S of Covid-19. bmt prescreen symptoms: Patient reports upper respiratory symptoms.  bmtu rpp testing: RPP to be ordered and performed ahead of BMTU admission. Patient educated on continued need to inform medical team of any new symptoms.     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   Previously required Valcyte  treatment  -Continue Letermovir  and Valtrex  BID through day +200 post SCT.      Recurrent Acute Sinusitis  Recurrent acute sinusitis with nasal congestion, frontal headache, and low-grade fever. Responded well to Augmentin .  -11/7 current sx resolving, started Augmentin  10 days (10/30-11/9)     Hypogammaglobulinemia  Monthly IVIG - last on 05/16/2024  - 11/7 check weekly IgG levels     GI:   GERD prophylaxis:  -Pepcid  BID     Nausea:  -Anti-emetics per BMT protocol.   - 06/14/24: Zyprexa  5 mg nightly      Diarrhea:  -Antidiarrheals per BMT protocol    Renal: Creatinine normal. No issues.      FEN:    Electrolyte replacement per protocol  - 11/14: 1L NS bolus x1    Hepatic: Normal bilirubin. SOS prophylaxis not indicated with RIC regimen.     CV: Followed by [facility] Cardiology: Heart failure with mildly reduced ejection fraction (HFmrEF)   Hx of EF 45-50%, most recent Echo 05/23/24 improved to 55-60%  Hx of prolonged QTc, last EKG 489 follow closely with addition of QTc prolonging medications  -11/7 QTc 457. Check weekly EKGs (next on 11/14), added cardiac lytes -11/8: 40 meq of K  -11/9: 20 meq of K     Hypertension: Home med Losartan  50 mg daily.     FALL:  -11/14: Lightheadedness with a fall overnight 2am. (-) stat head CT w/o contrast. Neuro exam intact    Pulm: No pulmonary symptoms currently. Reduced DLCO      Endocrine:   Menses suppression: Lupron  last 07/26/22.  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 At home uses Dexcom for monitoring.  - 11/7 ACHS and SSI  while inpatient. Lantus  25 units in AM. Can consider using Dexcom next week if numbers similar to hospital glucometer results   - 11/8: holding Lantus  due to morning POC 89  - 11/9: Blood glucose range 131 -189, resumed Lantus  at lower dose 10 Units  - 11/11: BG 200-400s in setting of steroids. Increased Lantus  to 15u.  - 11/12: BG 300s. Increased Lantus  to 18u.     Neuro/Pain:  - PRN tramadol  50 mg added. No oxy as this give patient migraines   - 11/8: Tramadol  was not helping, Morphine  with mild relief. Ordered dilaudid  0.5mg  q8 prn x 2 days for severe pain, removed tramadol  prn.  - 11/11: Tramadol  50mg  Q6H ordered PRN with goal to transition to oral medications for pain control.  - 11/12: Discontinued Dilaudid . Manage chronic hip/back pain with Tramadol  q6h PRN + heat therapy.     Psych: Followed by Palliative Care     Anxiety: Home med: Lexapro  10 mg daily   Reconnected with her local therapist   - If pt continues to endorse anxiety, may increase Lexapro  to 20 mg daily  - 11/7 added PRN ativan  0.5 mg Q8     Caregivers: Lives in [place], [place]  Primary caregiver: Jeanie Derenzo (mom)- first 30 days at [facility] then home if doing well will stay longer if medically needed  Secondary caregiver: Devaughn Ranger (husband)     Consults: While inpatient, consider consults to music therapy, integrative medicine and massage therapy.     Shawn Encarnacion Lucillie ELNITA  [facility] Marrow [facility]             [1]    cetirizine   10 mg Oral Daily    escitalopram  oxalate 10 mg Oral Daily    famotidine   20 mg Oral BID    [START ON 06/24/2024] filgrastim   480 mcg Subcutaneous Q24H    fluconazole   400 mg Oral Daily    heparin , porcine (PF)  2 mL Intravenous Mon,Wed,Fri    insulin  glargine  18 Units Subcutaneous Daily    insulin  lispro  0-20 Units Subcutaneous TID AC    letermovir   480 mg Oral Nightly    levoFLOXacin   500 mg Oral Daily    losartan   50 mg Oral Daily    methotrexate  (Preservative Free)  5 mg/m2 (Treatment Plan Adjusted) Intravenous Once    [START ON 06/18/2024] methotrexate  (Preservative Free)  5 mg/m2 (Treatment Plan Adjusted) Intravenous Once    [START ON 06/23/2024] methotrexate  (Preservative Free)  5 mg/m2 (Treatment Plan Adjusted) Intravenous Once    OLANZapine   5 mg Oral Nightly    tacrolimus  1.5 mg Oral BID    valACYclovir   500 mg Oral BID   [2]    IP okay to treat      sodium chloride  20 mL/hr (06/07/24 1326)   [3] albuterol , aluminum-magnesium  hydroxide-simethicone, CETAPHIL, dexAMETHasone , dextrose  in water, diphenhydrAMINE , emollient combination no.92, EPINEPHrine IM, famotidine  (PEPCID ) IV, glucagon , glucose, IP okay to treat, lidocaine , lidocaine , loperamide , loperamide , LORazepam , magnesium  sulfate, methylPREDNISolone  sodium succinate , potassium chloride  in water, prochlorperazine  **OR** prochlorperazine , sodium chloride  0.9%, traMADol 

## 2024-06-15 NOTE — Plan of Care (Signed)
 Patient AOx4, VSS, afebrile. Patient did not receive any PRN medications this shift. No falls or acute events this shift. Central line dressing changed due to itching and irritation.  Plan of Care reviewed with patient.  Safety measures in place of bed low with brakes locked, non-skid footwear on while out of bed, call bell within reach.        Problem: Adult Inpatient Plan of Care  Goal: Absence of Hospital-Acquired Illness or Injury  Outcome: Shift Focus  Intervention: Identify and Manage Fall Risk  Recent Flowsheet Documentation  Taken 06/15/2024 0715 by Toma Emmie RAMAN, RN  Safety Interventions:   bed alarm   bleeding precautions   environmental modification   fall reduction program maintained   infection management   isolation precautions   lighting adjusted for tasks/safety   low bed   neutropenic precautions   nonskid shoes/slippers when out of bed  Intervention: Prevent Skin Injury  Recent Flowsheet Documentation  Taken 06/15/2024 0815 by Toma Emmie RAMAN, RN  Positioning for Skin: Right  Taken 06/15/2024 0715 by Toma Emmie RAMAN, RN  Positioning for Skin: Right  Intervention: Prevent Infection  Recent Flowsheet Documentation  Taken 06/15/2024 0715 by Toma Emmie RAMAN, RN  Infection Prevention: cohorting utilized  Goal: Optimal Comfort and Wellbeing  Outcome: Shift Focus  Goal: Readiness for Transition of Care  Outcome: Shift Focus  Goal: Rounds/Family Conference  Outcome: Shift Focus     Problem: Infection  Goal: Absence of Infection Signs and Symptoms  Outcome: Shift Focus  Intervention: Prevent or Manage Infection  Recent Flowsheet Documentation  Taken 06/15/2024 0715 by Toma Emmie RAMAN, RN  Infection Management: aseptic technique maintained  Isolation Precautions: protective precautions maintained

## 2024-06-15 NOTE — Plan of Care (Signed)
 Shift Summary  Pt a & O X 4. Denies N/V/D. Afebrile. VSS.   A fall occurred in the bathroom after the patient became lightheaded, with no visible injuries noted at the time. Provider aware.  Sepsis risk score increased in the last two hours of the shift, and CBC revealed low WBC and neutrophil counts with toxic vacuolation on smear review.   Safety interventions such as bed alarms, low bed position, and hourly checks were maintained.   Infection prevention and neutropenic precautions were consistently implemented.   The patient's overall status included a documented fall and changes in sepsis risk and lab values.     Absence of Hospital-Acquired Illness or Injury: No new hospital-acquired injuries were documented, but a fall occurred in the bathroom after the patient became lightheaded; no visible injuries were noted at the time. Safety interventions such as bed alarms, low bed position, and hourly checks were maintained throughout the shift.     Absence of Infection Signs and Symptoms: Temperature remained stable and within normal range, and infection prevention measures including hand hygiene, PPE, and cohorting were consistently implemented. Sepsis risk score was low for most of the shift but increased in the last two hours; CBC showed low WBC and neutrophil counts, and smear review noted toxic vacuolation.

## 2024-06-16 LAB — BASIC METABOLIC PANEL
ANION GAP: 12 mmol/L (ref 5–14)
BLOOD UREA NITROGEN: 16 mg/dL (ref 9–23)
BUN / CREAT RATIO: 29
CALCIUM: 8.8 mg/dL (ref 8.7–10.4)
CHLORIDE: 103 mmol/L (ref 98–107)
CO2: 24 mmol/L (ref 20.0–31.0)
CREATININE: 0.55 mg/dL (ref 0.55–1.02)
EGFR CKD-EPI (2021) FEMALE: >90 mL/min/1.73m2 (ref >=60)
GLUCOSE RANDOM: 345 mg/dL — ABNORMAL HIGH (ref 70–179)
POTASSIUM: 4 mmol/L (ref 3.4–4.8)
SODIUM: 139 mmol/L (ref 135–145)

## 2024-06-16 LAB — CBC W/ AUTO DIFF
BASOPHILS ABSOLUTE COUNT: 0 10*9/L (ref 0.0–0.1)
BASOPHILS RELATIVE PERCENT: 0.2 %
EOSINOPHILS ABSOLUTE COUNT: 0 10*9/L (ref 0.0–0.5)
EOSINOPHILS RELATIVE PERCENT: 8.9 %
HEMATOCRIT: 27.2 % — ABNORMAL LOW (ref 34.0–44.0)
HEMOGLOBIN: 9.6 g/dL — ABNORMAL LOW (ref 11.3–14.9)
LYMPHOCYTES ABSOLUTE COUNT: 0 10*9/L — ABNORMAL LOW (ref 1.1–3.6)
LYMPHOCYTES RELATIVE PERCENT: 10.9 %
MEAN CORPUSCULAR HEMOGLOBIN CONC: 35.3 g/dL (ref 32.0–36.0)
MEAN CORPUSCULAR HEMOGLOBIN: 33.3 pg — ABNORMAL HIGH (ref 25.9–32.4)
MEAN CORPUSCULAR VOLUME: 94.2 fL (ref 77.6–95.7)
MEAN PLATELET VOLUME: 9.5 fL (ref 6.8–10.7)
MONOCYTES ABSOLUTE COUNT: 0 10*9/L — ABNORMAL LOW (ref 0.3–0.8)
MONOCYTES RELATIVE PERCENT: 4.2 %
NEUTROPHILS ABSOLUTE COUNT: 0.2 10*9/L — CL (ref 1.8–7.8)
NEUTROPHILS RELATIVE PERCENT: 75.8 %
PLATELET COUNT: 66 10*9/L — ABNORMAL LOW (ref 150–450)
RED BLOOD CELL COUNT: 2.89 10*12/L — ABNORMAL LOW (ref 3.95–5.13)
RED CELL DISTRIBUTION WIDTH: 14.1 % (ref 12.2–15.2)
WBC ADJUSTED: 0.3 10*9/L — CL (ref 3.6–11.2)

## 2024-06-16 LAB — MAGNESIUM: MAGNESIUM: 1.6 mg/dL (ref 1.6–2.6)

## 2024-06-16 LAB — TACROLIMUS LEVEL, TROUGH: TACROLIMUS, TROUGH: 3.1 ng/mL — ABNORMAL LOW (ref 5.0–15.0)

## 2024-06-16 MED ADMIN — famotidine (PEPCID) tablet 20 mg: 20 mg | ORAL | @ 01:00:00

## 2024-06-16 MED ADMIN — famotidine (PEPCID) tablet 20 mg: 20 mg | ORAL | @ 14:00:00

## 2024-06-16 MED ADMIN — escitalopram oxalate (LEXAPRO) tablet 10 mg: 10 mg | ORAL | @ 14:00:00

## 2024-06-16 MED ADMIN — losartan (COZAAR) tablet 50 mg: 50 mg | ORAL | @ 14:00:00

## 2024-06-16 MED ADMIN — fluconazole (DIFLUCAN) tablet 400 mg: 400 mg | ORAL | @ 14:00:00 | Stop: 2024-07-12

## 2024-06-16 MED ADMIN — letermovir (PREVYMIS) tablet 480 mg: 480 mg | ORAL | @ 01:00:00

## 2024-06-16 MED ADMIN — magnesium sulfate 2gm/50mL IVPB: 2 g | INTRAVENOUS | @ 10:00:00

## 2024-06-16 MED ADMIN — magnesium sulfate 2gm/50mL IVPB: 2 g | INTRAVENOUS | @ 12:00:00

## 2024-06-16 MED ADMIN — prochlorperazine (COMPAZINE) injection 10 mg: 10 mg | INTRAVENOUS | @ 15:00:00

## 2024-06-16 MED ADMIN — insulin glargine (LANTUS) injection BASAL 18 Units: 18 [IU] | SUBCUTANEOUS | @ 14:00:00

## 2024-06-16 MED ADMIN — cetirizine (ZYRTEC) tablet 10 mg: 10 mg | ORAL | @ 14:00:00

## 2024-06-16 MED ADMIN — valACYclovir (VALTREX) tablet 500 mg: 500 mg | ORAL | @ 14:00:00

## 2024-06-16 MED ADMIN — valACYclovir (VALTREX) tablet 500 mg: 500 mg | ORAL | @ 01:00:00

## 2024-06-16 MED ADMIN — insulin lispro (HumaLOG) injection CORRECTIONAL 0-20 Units: 0-20 [IU] | SUBCUTANEOUS | @ 22:00:00

## 2024-06-16 MED ADMIN — insulin lispro (HumaLOG) injection CORRECTIONAL 0-20 Units: 0-20 [IU] | SUBCUTANEOUS | @ 17:00:00

## 2024-06-16 MED ADMIN — levoFLOXacin (LEVAQUIN) tablet 500 mg: 500 mg | ORAL | @ 14:00:00 | Stop: 2024-07-12

## 2024-06-16 MED ADMIN — sodium chloride 0.9% (NS) bolus 1,000 mL: 1000 mL | INTRAVENOUS | @ 15:00:00 | Stop: 2024-06-16

## 2024-06-16 MED ADMIN — OLANZapine (ZYPREXA) tablet 5 mg: 5 mg | ORAL | @ 01:00:00

## 2024-06-16 MED ADMIN — tacrolimus (PROGRAF) 1.5mg combo product: 1.5 mg | ORAL | @ 14:00:00 | Stop: 2024-06-16

## 2024-06-16 MED ADMIN — tacrolimus (PROGRAF) 1.5mg combo product: 1.5 mg | ORAL | @ 01:00:00

## 2024-06-16 NOTE — Plan of Care (Signed)
 Vss. No falls this shift. Call bell within reach. Reports throat irritation, improves with eating popsicle. No other complaints.   Problem: Infection  Goal: Absence of Infection Signs and Symptoms  Outcome: Shift Focus  Intervention: Prevent or Manage Infection  Recent Flowsheet Documentation  Taken 06/16/2024 0700 by Joycelyn Lauraine PARAS, RN  Infection Management: aseptic technique maintained  Isolation Precautions: protective precautions maintained     Problem: Stem Cell/Bone Marrow Transplant  Goal: Nausea and Vomiting Symptom Relief  Outcome: Shift Focus     Problem: Fall Injury Risk  Goal: Absence of Fall and Fall-Related Injury  Outcome: Shift Focus  Intervention: Promote Injury-Free Environment  Recent Flowsheet Documentation  Taken 06/16/2024 0700 by Joycelyn Lauraine PARAS, RN  Safety Interventions:   bleeding precautions   isolation precautions   fall reduction program maintained   commode/urinal/bedpan at bedside   low bed   lighting adjusted for tasks/safety   muscle strengthening facilitated   neutropenic precautions   nonskid shoes/slippers when out of bed     Problem: Adult Inpatient Plan of Care  Goal: Absence of Hospital-Acquired Illness or Injury  Intervention: Identify and Manage Fall Risk  Recent Flowsheet Documentation  Taken 06/16/2024 0700 by Joycelyn Lauraine PARAS, RN  Safety Interventions:   bleeding precautions   isolation precautions   fall reduction program maintained   commode/urinal/bedpan at bedside   low bed   lighting adjusted for tasks/safety   muscle strengthening facilitated   neutropenic precautions   nonskid shoes/slippers when out of bed  Intervention: Prevent Infection  Recent Flowsheet Documentation  Taken 06/16/2024 0700 by Joycelyn Lauraine PARAS, RN  Infection Prevention:   cohorting utilized   environmental surveillance performed   equipment surfaces disinfected   hand hygiene promoted   personal protective equipment utilized   rest/sleep promoted   single patient room provided visitors restricted/screened     Problem: Stem Cell/Bone Marrow Transplant  Goal: Blood Counts Within Acceptable Range  Intervention: Monitor and Manage Hematologic Symptoms  Recent Flowsheet Documentation  Taken 06/16/2024 0700 by Joycelyn Lauraine PARAS, RN  Bleeding Precautions:   blood pressure closely monitored   coagulation study results reviewed   foot protection facilitated   gentle oral care promoted   monitored for signs of bleeding  Goal: Absence of Infection  Intervention: Prevent and Manage Infection  Recent Flowsheet Documentation  Taken 06/16/2024 0700 by Joycelyn Lauraine PARAS, RN  Infection Management: aseptic technique maintained  Infection Prevention:   cohorting utilized   environmental surveillance performed   equipment surfaces disinfected   hand hygiene promoted   personal protective equipment utilized   rest/sleep promoted   single patient room provided   visitors restricted/screened  Isolation Precautions: protective precautions maintained

## 2024-06-16 NOTE — Progress Notes (Signed)
 BONE MARROW TRANSPLANT AND CELLULAR THERAPY PROGRESS NOTE    [name]  MRN: [id number]  Encounter Date: 06/16/24    Referring physician:  Dr. Toribio Bunker  BMT Attending MD: Dr. Jeaneen    Disease: Ph+B ALL  Current disease status: CR MRD Negative  Type of Transplant: RIC MUD Allo  Graft Source: Fresh PBSCs  Transplant Day: +4     Identifying Statement:  Patricia [name] is a 48 y.o. 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 Patricia [name] now presents for a matched unrelated donor stem cell transplant.      Interval History:  - No acute events overnight. No blood product received but 4g of mag replaced.  - Had a fall and hit back of head the night of 11/14, CTH w/o contrast (-), no neuro deficit. feeling well thins morning, denies N/V, HA or dizziness/lightheadedness. Monitor closely  - Reports one soft stool yesterday and will continue monitor closely  - POC glucose ranged from 288-363 last 24 hours, had completed dex on 11/11.  Will continue lantus  18U in am, and adjust SSI insulin  sensitivity factor. monitor blood glucose closely, adjust insulin  as needed  - PO intake 1.1L. Weight about 2kg down from admission. 1L of NS bolus given in the settings of low oral intake and decreases appetite. Monitor closely       Review of Systems:  A full system review was performed and was negative except as noted in the above interval history.    Temp:  [36.5 ??C (97.7 ??F)-37 ??C (98.6 ??F)] 36.8 ??C (98.3 ??F)  Pulse:  [83-124] 83  Resp:  [16-18] 18  BP: (102-137)/(68-87) 111/68  MAP (mmHg):  [82-97] 82  SpO2:  [99 %-100 %] 100 %    I/O last 3 completed shifts:  In: 2437 [P.O.:1220; I.V.:1217]  Out: 3250 [Urine:3250]  No intake/output data recorded.    Last 5 Recorded Weights    06/09/24 2023 06/11/24 2027 06/13/24 2017 06/14/24 1945   Weight: 78.4 kg (172 lb 12.8 oz) 77.3 kg (170 lb 6.4 oz) 78.5 kg (173 lb) 77.7 kg (171 lb 6.4 oz)    06/15/24 2008   Weight: 77.2 kg (170 lb 3.1 oz)     Weight change: -0.547 kg (-1 lb 3.3 oz)    Test Results:   Reviewed in EPIC. Abnormal values discussed below.     Scheduled Meds:Scheduled Medications[1]  Continuous Infusions:Infusions Meds[2]  PRN Meds:.PRN Medications[3]    Physical Exam: No change from 06/13/24  General : Lying in bed, fatigued appearing. No acute distress noted.   Central venous access: Line clean, dry, intact. No erythema or drainage noted.   ENT: Moist mucous membranes. Oropharhynx without lesions, erythema or exudate.   Cardiovascular: Pulse normal rate, regularity and rhythm. S1 and S2 normal, without any murmur, rub, or gallop.  Lungs: Clear to auscultation bilaterally, without wheezes/crackles/rhonchi. Good air movement.   Skin: Warm, dry, intact. No rash noted.   Psychiatry: Alert and oriented to person, place, and time.   Gastrointestinal: Normoactive bowel sounds, abdomen soft, non-tender   Musculoskeletal/Extremities: Full range of motion in shoulder, elbow, hip knee, ankle, wrists and feet. No edema.   Neurologic: CNII-XII intact. Normal strength and sensation throughout    Assessment/Plan:    BMT: ALL  CR MRD Negative  HCT-CI (age adjusted) 33 (age, anxiety, reduced DLCO, HF (reduced EF), T2DM, CMV viremia)  Conditioning:.  1. Fludarabine 40 mg/m2 days -5, -4, -3, -2  2. Melphalan 100  mg/m2  day -1      Donor: 10/10, Female, ABO O-, CMV negative  Engraftment: G-CSF starting Day + 5 through WBC recovery (as defined as ANC 1.0 x 2 days or 3.0 x 1 day)  -Date of last G-CSF injection: TBD     GVHD prophylaxis:   1.Tacrolimus starting on day -3 (goal 5-10 ng/mL)  2. Methotrexate  5 mg/m2 IVP on days +1, +3, +6 and +11  3.  ATG per [facility] standard dosing will not be included     Hem: Transfusion criteria: Transfuse 1 unit of PRBCs for hemoglobin <7 and 1 unit of platelets for Plt <10K or bleeding. Patricia [name] does not have a history of transfusion reactions. Consent was obtained and documented on 06/05/24.    ID:   Prophylaxis:  -Antiviral: Valtrex  500 mg po BID started on admission and continue for 2 year      Letermovir  indicated as R/CMV + D/CMV - and with prior hx of CMV viremia, plan to continue on admission through at least day +200  -Antifungal: Fluconazole  400 mg po daily to start day 0 thorugh day +75  -Antibacterial: Levaquin  500 mg po daily to start day 0, continue while neutropenic  -PJP: Sulfa allergic. Dapsone  daily upon platelet engraftment, has tolerated this well in the past. Toxo PCR negative.   -11/7 stopped Dapsone  upon admission, restart with platelet engraftment.      Patient was screened for upper respiratory symptoms, including S/S of Covid-19. bmt prescreen symptoms: Patient reports upper respiratory symptoms.  bmtu rpp testing: RPP to be ordered and performed ahead of BMTU admission. Patient educated on continued need to inform medical team of any new symptoms.     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   Previously required Valcyte  treatment  -Continue Letermovir  and Valtrex  BID through day +200 post SCT.      Recurrent Acute Sinusitis  Recurrent acute sinusitis with nasal congestion, frontal headache, and low-grade fever. Responded well to Augmentin .  -11/7 current sx resolving, started Augmentin  10 days (10/30-11/9)     Hypogammaglobulinemia  Monthly IVIG - last on 05/16/2024  - 11/7 check weekly IgG levels     GI:   GERD prophylaxis:  -Pepcid  BID     Nausea:  -Anti-emetics per BMT protocol.   - 06/14/24: Zyprexa  5 mg nightly      Diarrhea:  -Antidiarrheals per BMT protocol    Renal: Creatinine normal. No issues.      FEN:    Electrolyte replacement per protocol  - 11/15: 1L NS bolus x1  -  11/16: 1L NS bolus x1    Hepatic: Normal bilirubin. SOS prophylaxis not indicated with RIC regimen.     CV: Followed by [facility]: Heart failure with mildly reduced ejection fraction (HFmrEF)   Hx of EF 45-50%, most recent Echo 05/23/24 improved to 55-60%  Hx of prolonged QTc, last EKG 489 follow closely with addition of QTc prolonging medications  -11/7 QTc 457. Check weekly EKGs (next on 11/14), added cardiac lytes   -11/8: 40 meq of K  -11/9: 20 meq of K     Hypertension: Home med Losartan  50 mg daily.     FALL:  -11/14: Lightheadedness with a fall overnight 2am. (-) stat head CT w/o contrast. Neuro exam intact  -11/16: Feeling well, denies N/V, HA/lightheaded.    Pulm: No pulmonary symptoms currently. Reduced DLCO      Endocrine:   Menses suppression: Lupron  last 07/26/22.  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 At home uses Dexcom for monitoring.  - 11/7 ACHS and SSI while inpatient. Lantus  25 units in AM. Can consider using Dexcom next week if numbers similar to hospital glucometer results   - 11/8: holding Lantus  due to morning POC 89  - 11/9: Blood glucose range 131 -189, resumed Lantus  at lower dose 10 Units  - 11/11: BG 200-400s in setting of steroids. Increased Lantus  to 15u.  - 11/12: BG 300s. Increased Lantus  to 18u.   - 11/16: BG range 288-363 last 24 hours, adjust SSI insulin  sensitivity factor    Neuro/Pain:  - PRN tramadol  50 mg added. No oxy as this give patient migraines   - 11/8: Tramadol  was not helping, Morphine  with mild relief. Ordered dilaudid  0.5mg  q8 prn x 2 days for severe pain, removed tramadol  prn.  - 11/11: Tramadol  50mg  Q6H ordered PRN with goal to transition to oral medications for pain control.  - 11/12: Discontinued Dilaudid . Manage chronic hip/back pain with Tramadol  q6h PRN + heat therapy.     Psych: Followed by Palliative Care     Anxiety: Home med: Lexapro  10 mg daily   Reconnected with her local therapist   - If pt continues to endorse anxiety, may increase Lexapro  to 20 mg daily  - 11/7 added PRN ativan  0.5 mg Q8     Caregivers: Lives in [place], [place]  Primary caregiver: Jeanie Okray (mom)- first 30 days at [facility] then home if doing well will stay longer if medically needed  Secondary caregiver: Devaughn Ranger (husband)     Consults: While inpatient, consider consults to music therapy, integrative medicine and massage therapy.     Shawn Encarnacion Lucillie ELNITA  [facility] Marrow Transplant [facility] Cellular Therapy [facility]             [1]    cetirizine   10 mg Oral Daily    escitalopram  oxalate  10 mg Oral Daily    famotidine   20 mg Oral BID    [START ON 06/24/2024] filgrastim   480 mcg Subcutaneous Q24H    fluconazole   400 mg Oral Daily    heparin , porcine (PF)  2 mL Intravenous Mon,Wed,Fri    insulin  glargine  18 Units Subcutaneous Daily    insulin  lispro  0-20 Units Subcutaneous TID AC    letermovir   480 mg Oral Nightly    levoFLOXacin   500 mg Oral Daily    losartan   50 mg Oral Daily    [START ON 06/18/2024] methotrexate  (Preservative Free)  5 mg/m2 (Treatment Plan Adjusted) Intravenous Once    [START ON 06/23/2024] methotrexate  (Preservative Free)  5 mg/m2 (Treatment Plan Adjusted) Intravenous Once    OLANZapine   5 mg Oral Nightly    tacrolimus  1.5 mg Oral BID    valACYclovir   500 mg Oral BID   [2]    IP okay to treat      sodium chloride  20 mL/hr (06/07/24 1326)   [3] albuterol , aluminum-magnesium  hydroxide-simethicone, CETAPHIL, dexAMETHasone , dextrose  in water, diphenhydrAMINE , emollient combination no.92, EPINEPHrine IM, famotidine  (PEPCID ) IV, glucagon , glucose, IP okay to treat, lidocaine , lidocaine , loperamide , loperamide , LORazepam , magnesium  sulfate, methylPREDNISolone  sodium succinate , potassium chloride  in water, prochlorperazine  **OR** prochlorperazine , sodium chloride  0.9%, traMADol 

## 2024-06-16 NOTE — Plan of Care (Signed)
 Shift Summary  Magnesium  sulfate was administered in response to a low magnesium  lab result.   Safety interventions were maintained, including fall reduction and chemotherapeutic agent precautions.   Pain remained at zero throughout the shift, and mobility was slightly limited but stable.   Sepsis risk scores and infection-related vitals remained stable.   Overall, the shift was uneventful with no new hospital-acquired injuries or infection symptoms documented.     Absence of Hospital-Acquired Illness or Injury: No new hospital-acquired injuries were documented, and safety interventions such as low bed position, nonskid footwear, and fall reduction measures were maintained throughout the shift. Activity and mobility remained stable, and pain was consistently reported as zero.     Absence of Infection Signs and Symptoms: Temperature remained within a narrow range and respiratory rate was stable; sepsis risk scores were consistently low, and no new symptoms related to infection were noted.

## 2024-06-16 NOTE — Consults (Signed)
 Tacrolimus Therapeutic Monitoring Pharmacy Note    Patricia [name] is a 48 y.o. female with Ph+ ALL, currently day +4   of RIC with Flu40/Mel100 followed by 10/10 MUD allogeneic stem cell transplant. She is continuing tacrolimus and MTX for GVHD prophylaxis.     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     Date of Transplant: 06/12/24      Prior Dosing Information: see table below     Source(s) of information used to determine prior to admission dosing: MAR    Goals:  Therapeutic Drug Levels  Tacrolimus trough goal: 5-10 ng/mL    Additional Clinical Monitoring/Outcomes  Monitor renal function (SCr and urine output) and liver function (LFTs)  Monitor for signs/symptoms of adverse events (e.g., hyperglycemia, hyperkalemia, hypomagnesemia, hypertension, headache, tremor)    Previous Lab Results:  Tacrolimus, Trough   Date Value Ref Range Status   06/16/2024 3.1 (L) 5.0 - 15.0 ng/mL Final   06/14/2024 9.4 5.0 - 15.0 ng/mL Final   06/12/2024 10.7 5.0 - 15.0 ng/mL Final   06/11/2024 16.1 (H) 5.0 - 15.0 ng/mL Final     Creatinine   Date Value Ref Range Status   06/15/2024 0.55 0.55 - 1.02 mg/dL Final   88/84/7974 9.51 (L) 0.55 - 1.02 mg/dL Final   88/86/7974 9.44 0.55 - 1.02 mg/dL Final      Result:  Tacrolimus level from today was drawn appropriately     Pharmacokinetic Considerations and Significant Drug Interactions:  Concurrent CYP3A4 substrates/inhibitors: fluconazole  (moderate inhibitor, started 11/12), letermovir  (moderate inhibitor, initiated 05/2023)    Assessment/Plan:  Recommendation(s)  Trough is subtherapeutic at 3.1 today and appropriately decreased following held dose on 11/14 PM and dose reduction.   Fluconazole  recently started on 11/12 for fungal PPX. As fluconazole  reaches steady state, will continue to affect tacrolimus levels. Letermovir  has been at steady state and should not have a continued affect on tacrolimus levels  SCr stable/WNL  Bili/LFTs from 11/12 also stable/WNL  Given the above, plan to increase to tacrolimus 1.5 mg qAM and 2 mg qPM.    Follow-up  Next level has been ordered on 11/18 at 0800.   A pharmacist will continue to monitor and recommend levels as appropriate    Longitudinal Dose Monitoring:  Date Dose (mg), Route AM Scr (mg/dL) Level  (ng/mL) Key Drug Interactions   11/9 3.5/3.5 0.51  Letermovir    11/10 3.5/3.5 0.44  Letermovir    11/11 2.5/--- 0.51 16.1 Letermovir    11/12 ---/--- 0.53 10.7 Letermovir , Fluconazole  started   11/13 2/2 0.52 -- Letermovir , Fluconazole    11/14 2/-- 0.55 9.4 Letermovir , Fluconazole    11/15 1.5/1.5 0.48  Letermovir , Fluconazole    11/16 1.5/2 0.55 3.1 Letermovir , Fluconazole       Please page service pharmacist with questions/clarifications.    Patricia [name], PharmD  PGY2 Oncology Pharmacy Resident

## 2024-06-17 LAB — CBC W/ AUTO DIFF
BASOPHILS ABSOLUTE COUNT: 0 10*9/L (ref 0.0–0.1)
BASOPHILS RELATIVE PERCENT: 0 %
EOSINOPHILS ABSOLUTE COUNT: 0 10*9/L (ref 0.0–0.5)
EOSINOPHILS RELATIVE PERCENT: 7.7 %
HEMATOCRIT: 27.8 % — ABNORMAL LOW (ref 34.0–44.0)
HEMOGLOBIN: 9.7 g/dL — ABNORMAL LOW (ref 11.3–14.9)
LYMPHOCYTES ABSOLUTE COUNT: 0 10*9/L — ABNORMAL LOW (ref 1.1–3.6)
LYMPHOCYTES RELATIVE PERCENT: 35 %
MEAN CORPUSCULAR HEMOGLOBIN CONC: 34.7 g/dL (ref 32.0–36.0)
MEAN CORPUSCULAR HEMOGLOBIN: 32.4 pg (ref 25.9–32.4)
MEAN CORPUSCULAR VOLUME: 93.3 fL (ref 77.6–95.7)
MEAN PLATELET VOLUME: 8.8 fL (ref 6.8–10.7)
MONOCYTES ABSOLUTE COUNT: 0 10*9/L — ABNORMAL LOW (ref 0.3–0.8)
MONOCYTES RELATIVE PERCENT: 11.1 %
NEUTROPHILS ABSOLUTE COUNT: 0 10*9/L — CL (ref 1.8–7.8)
NEUTROPHILS RELATIVE PERCENT: 46.2 %
PLATELET COUNT: 51 10*9/L — ABNORMAL LOW (ref 150–450)
RED BLOOD CELL COUNT: 2.98 10*12/L — ABNORMAL LOW (ref 3.95–5.13)
RED CELL DISTRIBUTION WIDTH: 14.1 % (ref 12.2–15.2)
WBC ADJUSTED: 0.1 10*9/L — CL (ref 3.6–11.2)

## 2024-06-17 LAB — CMV DNA, QUANTITATIVE, PCR: CMV VIRAL LD: NOT DETECTED

## 2024-06-17 LAB — HEPATIC FUNCTION PANEL
ALBUMIN: 3.5 g/dL (ref 3.4–5.0)
ALKALINE PHOSPHATASE: 62 U/L (ref 46–116)
ALT (SGPT): 129 U/L — ABNORMAL HIGH (ref 10–49)
AST (SGOT): 89 U/L — ABNORMAL HIGH (ref <=34)
BILIRUBIN DIRECT: <0.1 mg/dL (ref 0.00–0.30)
BILIRUBIN TOTAL: 0.3 mg/dL (ref 0.3–1.2)
PROTEIN TOTAL: 6 g/dL (ref 5.7–8.2)

## 2024-06-17 LAB — APTT
APTT: 26.6 s (ref 24.8–38.4)
HEPARIN CORRELATION: 0.2

## 2024-06-17 LAB — PROTIME-INR
INR: 1.05
PROTIME: 12 s (ref 9.9–12.6)

## 2024-06-17 LAB — BASIC METABOLIC PANEL
ANION GAP: 12 mmol/L (ref 5–14)
BLOOD UREA NITROGEN: 12 mg/dL (ref 9–23)
BUN / CREAT RATIO: 27
CALCIUM: 8.9 mg/dL (ref 8.7–10.4)
CHLORIDE: 109 mmol/L — ABNORMAL HIGH (ref 98–107)
CO2: 23 mmol/L (ref 20.0–31.0)
CREATININE: 0.45 mg/dL — ABNORMAL LOW (ref 0.55–1.02)
EGFR CKD-EPI (2021) FEMALE: >90 mL/min/1.73m2 (ref >=60)
GLUCOSE RANDOM: 212 mg/dL — ABNORMAL HIGH (ref 70–179)
POTASSIUM: 4 mmol/L (ref 3.4–4.8)
SODIUM: 144 mmol/L (ref 135–145)

## 2024-06-17 LAB — LACTATE DEHYDROGENASE: LACTATE DEHYDROGENASE: 210 U/L (ref 120–246)

## 2024-06-17 LAB — EBV QUANTITATIVE PCR, BLOOD: EBV VIRAL LOAD RESULT: NOT DETECTED

## 2024-06-17 LAB — PHOSPHORUS: PHOSPHORUS: 3.8 mg/dL (ref 2.4–5.1)

## 2024-06-17 LAB — IONIZED CALCIUM VENOUS: CALCIUM IONIZED VENOUS (MG/DL): 4.83 mg/dL (ref 4.40–5.40)

## 2024-06-17 LAB — MAGNESIUM: MAGNESIUM: 1.6 mg/dL (ref 1.6–2.6)

## 2024-06-17 MED ADMIN — famotidine (PEPCID) tablet 20 mg: 20 mg | ORAL | @ 02:00:00

## 2024-06-17 MED ADMIN — famotidine (PEPCID) tablet 20 mg: 20 mg | ORAL | @ 14:00:00

## 2024-06-17 MED ADMIN — escitalopram oxalate (LEXAPRO) tablet 10 mg: 10 mg | ORAL | @ 14:00:00

## 2024-06-17 MED ADMIN — loperamide (IMODIUM) capsule 2 mg: 2 mg | ORAL | @ 10:00:00

## 2024-06-17 MED ADMIN — losartan (COZAAR) tablet 50 mg: 50 mg | ORAL | @ 14:00:00

## 2024-06-17 MED ADMIN — fluconazole (DIFLUCAN) tablet 400 mg: 400 mg | ORAL | @ 14:00:00 | Stop: 2024-07-12

## 2024-06-17 MED ADMIN — letermovir (PREVYMIS) tablet 480 mg: 480 mg | ORAL | @ 02:00:00

## 2024-06-17 MED ADMIN — heparin, porcine (PF) 100 unit/mL injection 2 mL: 2 mL | INTRAVENOUS | @ 04:00:00

## 2024-06-17 MED ADMIN — magnesium sulfate 2gm/50mL IVPB: 2 g | INTRAVENOUS | @ 10:00:00

## 2024-06-17 MED ADMIN — magnesium sulfate 2gm/50mL IVPB: 2 g | INTRAVENOUS | @ 12:00:00

## 2024-06-17 MED ADMIN — prochlorperazine (COMPAZINE) injection 10 mg: 10 mg | INTRAVENOUS | @ 14:00:00

## 2024-06-17 MED ADMIN — prochlorperazine (COMPAZINE) injection 10 mg: 10 mg | INTRAVENOUS | @ 03:00:00

## 2024-06-17 MED ADMIN — insulin glargine (LANTUS) injection BASAL 18 Units: 18 [IU] | SUBCUTANEOUS | @ 14:00:00

## 2024-06-17 MED ADMIN — cetirizine (ZYRTEC) tablet 10 mg: 10 mg | ORAL | @ 14:00:00

## 2024-06-17 MED ADMIN — valACYclovir (VALTREX) tablet 500 mg: 500 mg | ORAL | @ 14:00:00

## 2024-06-17 MED ADMIN — valACYclovir (VALTREX) tablet 500 mg: 500 mg | ORAL | @ 02:00:00

## 2024-06-17 MED ADMIN — insulin lispro (HumaLOG) injection CORRECTIONAL 0-20 Units: 0-20 [IU] | SUBCUTANEOUS | @ 18:00:00

## 2024-06-17 MED ADMIN — insulin lispro (HumaLOG) injection CORRECTIONAL 0-20 Units: 0-20 [IU] | SUBCUTANEOUS | @ 14:00:00

## 2024-06-17 MED ADMIN — insulin lispro (HumaLOG) injection CORRECTIONAL 0-20 Units: 0-20 [IU] | SUBCUTANEOUS | @ 22:00:00

## 2024-06-17 MED ADMIN — tacrolimus (PROGRAF) 1.5mg combo product: 1.5 mg | ORAL | @ 14:00:00

## 2024-06-17 MED ADMIN — levoFLOXacin (LEVAQUIN) tablet 500 mg: 500 mg | ORAL | @ 14:00:00 | Stop: 2024-07-12

## 2024-06-17 MED ADMIN — ondansetron (ZOFRAN) tablet 8 mg: 8 mg | ORAL | @ 23:00:00

## 2024-06-17 MED ADMIN — ondansetron (ZOFRAN) tablet 8 mg: 8 mg | ORAL | @ 18:00:00

## 2024-06-17 MED ADMIN — tacrolimus (PROGRAF) capsule 2 mg: 2 mg | ORAL | @ 02:00:00

## 2024-06-17 MED ADMIN — OLANZapine (ZYPREXA) tablet 5 mg: 5 mg | ORAL | @ 02:00:00

## 2024-06-17 MED ADMIN — sodium chloride 0.9% (NS) bolus 1,000 mL: 1000 mL | INTRAVENOUS | @ 17:00:00 | Stop: 2024-06-17

## 2024-06-17 NOTE — Plan of Care (Signed)
 Pt is day +5 for SCT-allo. Pt endorsed diarrhea, c diff test resulted negative and pt declined intervention. PRN IV compazine  given for nausea. Replacement IV Mag given. Pt denies any other needs or concerns.     Problem: Adult Inpatient Plan of Care  Goal: Absence of Hospital-Acquired Illness or Injury  Intervention: Identify and Manage Fall Risk  Recent Flowsheet Documentation  Taken 06/16/2024 2047 by Alphia Lorane BROCKS, RN  Safety Interventions:   low bed   lighting adjusted for tasks/safety   nonskid shoes/slippers when out of bed   chemotherapeutic agent precautions   neutropenic precautions  Goal: Optimal Comfort and Wellbeing  Outcome: Shift Focus     Problem: Stem Cell/Bone Marrow Transplant  Goal: Optimal Coping with Transplant  Outcome: Shift Focus  Goal: Improved Activity Tolerance  Outcome: Shift Focus  Goal: Blood Counts Within Acceptable Range  Intervention: Monitor and Manage Hematologic Symptoms  Recent Flowsheet Documentation  Taken 06/16/2024 2047 by Alphia Lorane BROCKS, RN  Bleeding Precautions: blood pressure closely monitored  Goal: Improved Oral Mucous Membrane Health and Integrity  Intervention: Promote Oral Comfort and Health  Recent Flowsheet Documentation  Taken 06/16/2024 2047 by Jermie Hippe C, RN  Oral Care: teeth brushed

## 2024-06-17 NOTE — Progress Notes (Signed)
 BONE MARROW TRANSPLANT AND CELLULAR THERAPY PROGRESS NOTE    [name]  MRN: [id number]  Encounter Date: 06/17/24    Referring physician:  Dr. Toribio Bunker  BMT Attending MD: Dr. Jeaneen    Disease: Ph+B ALL  Current disease status: CR MRD Negative  Type of Transplant: RIC MUD Allo  Graft Source: Fresh PBSCs  Transplant Day: +5     Identifying Statement:  Patricia [name] is a 48 y.o. 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 Arah now presents for a matched unrelated donor stem cell transplant.      Interval History:  - No acute events overnight  - Endorses nausea and vomiting this morning. Received a dose of compazine  this morning, Zyprexa  was scheduled  nightly on Friday. Will plan to schedule Zofran  TID (avoiding scheduling Compazine  due to lightheadedness and fall over the weekend).   - Four loose stools, C. Diff negative. Using imodium  PRN.   - PO intake 2.4 L. Will repeat 1L bolus this morning. Weight change: 0.1 kg (3.5 oz)  - POC glucose ranged from 167-364 last 24 hours, received 18U SSI. Will continue lantus  18U in am. Monitor blood glucose closely, adjust insulin  as needed        Review of Systems:  A full system review was performed and was negative except as noted in the above interval history.    Temp:  [36.6 ??C (97.8 ??F)-37.3 ??C (99.2 ??F)] 36.6 ??C (97.8 ??F)  Pulse:  [93-101] 98  Resp:  [16-18] 18  BP: (115-136)/(79-94) 131/94  MAP (mmHg):  [91-106] 106  SpO2:  [97 %-99 %] 97 %    I/O last 3 completed shifts:  In: 5538 [P.O.:3408; I.V.:2130]  Out: 2875 [Urine:2875]  I/O this shift:  In: 466 [P.O.:315; I.V.:151]  Out: 450 [Urine:450]    Last 5 Recorded Weights    06/11/24 2027 06/13/24 2017 06/14/24 1945 06/15/24 2008   Weight: 77.3 kg (170 lb 6.4 oz) 78.5 kg (173 lb) 77.7 kg (171 lb 6.4 oz) 77.2 kg (170 lb 3.1 oz)    06/16/24 2037   Weight: 77.3 kg (170 lb 6.7 oz)     Weight change: 0.1 kg (3.5 oz)    Test Results:   Reviewed in EPIC. Abnormal values discussed below. Scheduled Meds:Scheduled Medications[1]  Continuous Infusions:Infusions Meds[2]  PRN Meds:.PRN Medications[3]    Physical Exam:   General : Sitting in bed, fatigued appearing, vomiting. No acute distress noted.   Central venous access: Line clean, dry, intact. No erythema or drainage noted.   ENT: Moist mucous membranes. Oropharhynx without lesions, erythema or exudate.   Cardiovascular: Pulse normal rate, regularity and rhythm. S1 and S2 normal, without any murmur, rub, or gallop.  Lungs: Clear to auscultation bilaterally, without wheezes/crackles/rhonchi. Good air movement.   Skin: Pale. Warm, dry, intact. No rash noted.   Psychiatry: Alert and oriented to person, place, and time.   Gastrointestinal: Normoactive bowel sounds, abdomen soft, non-tender   Musculoskeletal/Extremities: Full range of motion in shoulder, elbow, hip knee, ankle, wrists and feet. No edema.   Neurologic: CNII-XII intact. Normal strength and sensation throughout    Assessment/Plan:    BMT: ALL  CR MRD Negative  HCT-CI (age adjusted) 43 (age, anxiety, reduced DLCO, HF (reduced EF), T2DM, CMV viremia)  Conditioning:.  1. Fludarabine 40 mg/m2 days -5, -4, -3, -2  2. Melphalan 100  mg/m2 day -1      Donor: 10/10, Female, ABO O-, CMV negative  Engraftment: G-CSF starting  Day + 5 through WBC recovery (as defined as ANC 1.0 x 2 days or 3.0 x 1 day)  -Date of last G-CSF injection: TBD     GVHD prophylaxis:   1.Tacrolimus starting on day -3 (goal 5-10 ng/mL)  2. Methotrexate  5 mg/m2 IVP on days +1, +3, +6 and +11  3.  ATG per [facility] standard dosing will not be included     Hem: Transfusion criteria: Transfuse 1 unit of PRBCs for hemoglobin <7 and 1 unit of platelets for Plt <10K or bleeding. Patricia [name] does not have a history of transfusion reactions. Consent was obtained and documented on 06/05/24.    ID:   Prophylaxis:  -Antiviral: Valtrex  500 mg po BID started on admission and continue for 2 year      Letermovir  indicated as R/CMV + D/CMV - and with prior hx of CMV viremia, plan to continue on admission through at least day +200  -Antifungal: Fluconazole  400 mg po daily to start day 0 thorugh day +75  -Antibacterial: Levaquin  500 mg po daily to start day 0, continue while neutropenic  -PJP: Sulfa allergic. Dapsone  daily upon platelet engraftment, has tolerated this well in the past. Toxo PCR negative.   -11/7 stopped Dapsone  upon admission, restart with platelet engraftment.      Patient was screened for upper respiratory symptoms, including S/S of Covid-19. bmt prescreen symptoms: Patient reports upper respiratory symptoms.  bmtu rpp testing: RPP to be ordered and performed ahead of BMTU admission. Patient educated on continued need to inform medical team of any new symptoms.     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   Previously required Valcyte  treatment  -Continue Letermovir  and Valtrex  BID through day +200 post SCT.      Recurrent Acute Sinusitis  Recurrent acute sinusitis with nasal congestion, frontal headache, and low-grade fever. Responded well to Augmentin .  -11/7 current sx resolving, started Augmentin  10 days (10/30-11/9)     Hypogammaglobulinemia  Monthly IVIG - last on 05/16/2024  - 11/7 check weekly IgG levels     GI:   GERD prophylaxis:  -Pepcid  BID     Nausea:  -Anti-emetics per BMT protocol.   - 06/14/24: Zyprexa  5 mg nightly      Diarrhea:  -Antidiarrheals per BMT protocol    Renal: Creatinine normal. No issues.      FEN:    Electrolyte replacement per protocol  - 11/15 - 11/17: 1L NS bolus x1    Hepatic: Normal bilirubin. SOS prophylaxis not indicated with RIC regimen.   - 06/17/24: Rise in AST/ALT, monitor LFTs daily     CV: Followed by [facility] Cardiology: Heart failure with mildly reduced ejection fraction (HFmrEF)   Hx of EF 45-50%, most recent Echo 05/23/24 improved to 55-60%  Hx of prolonged QTc, last EKG 489 follow closely with addition of QTc prolonging medications  -11/7 QTc 457. Check weekly EKGs (next on 11/14), added cardiac lytes   -11/8: 40 meq of K  -11/9: 20 meq of K     Hypertension: Home med Losartan  50 mg daily.     FALL:  -11/14: Lightheadedness with a fall overnight 2am. (-) stat head CT w/o contrast. Neuro exam intact  -11/16: Feeling well, denies N/V, HA/lightheaded.    Pulm: No pulmonary symptoms currently. Reduced DLCO      Endocrine:   Menses suppression: Lupron  last 07/26/22.  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 At home uses Dexcom for monitoring.  - 11/7 ACHS and SSI while inpatient. Lantus  25 units in AM. Can consider using Dexcom next week if numbers similar to hospital glucometer results   - 11/8: holding Lantus  due to morning POC 89  - 11/9: Blood glucose range 131 -189, resumed Lantus  at lower dose 10 Units  - 11/11: BG 200-400s in setting of steroids. Increased Lantus  to 15u.  - 11/12: BG 300s. Increased Lantus  to 18u.   - 11/16: BG range 288-363 last 24 hours, adjust SSI insulin  sensitivity factor    Neuro/Pain:  - PRN tramadol  50 mg added. No oxy as this give patient migraines   - 11/8: Tramadol  was not helping, Morphine  with mild relief. Ordered dilaudid  0.5mg  q8 prn x 2 days for severe pain, removed tramadol  prn.  - 11/11: Tramadol  50mg  Q6H ordered PRN with goal to transition to oral medications for pain control.  - 11/12: Discontinued Dilaudid . Manage chronic hip/back pain with Tramadol  q6h PRN + heat therapy.     Psych: Followed by Palliative Care     Anxiety: Home med: Lexapro  10 mg daily   Reconnected with her local therapist   - If pt continues to endorse anxiety, may increase Lexapro  to 20 mg daily  - 11/7 added PRN ativan  0.5 mg Q8     Caregivers: Lives in [place], [place]  Primary caregiver: Jeanie Preyer (mom)- first 30 days at [facility] then home if doing well will stay longer if medically needed  Secondary caregiver: Devaughn Ranger (husband)     Consults: While inpatient, consider consults to music therapy, integrative medicine and massage therapy.         Edd GORMAN Bough, AGNP  [facility]               [1]    cetirizine   10 mg Oral Daily    escitalopram  oxalate  10 mg Oral Daily    famotidine   20 mg Oral BID    [START ON 06/24/2024] filgrastim   480 mcg Subcutaneous Q24H    fluconazole   400 mg Oral Daily    heparin , porcine (PF)  2 mL Intravenous Mon,Wed,Fri    insulin  glargine  18 Units Subcutaneous Daily    insulin  lispro  0-20 Units Subcutaneous TID AC    letermovir   480 mg Oral Nightly    levoFLOXacin   500 mg Oral Daily    losartan   50 mg Oral Daily    [START ON 06/18/2024] methotrexate  (Preservative Free)  5 mg/m2 (Treatment Plan Adjusted) Intravenous Once    [START ON 06/23/2024] methotrexate  (Preservative Free)  5 mg/m2 (Treatment Plan Adjusted) Intravenous Once    OLANZapine   5 mg Oral Nightly    ondansetron   8 mg Oral TID    sodium chloride  0.9%  1,000 mL Intravenous Once    tacrolimus  1.5 mg Oral Daily    tacrolimus  2 mg Oral Nightly    valACYclovir   500 mg Oral BID   [2]    IP okay to treat      sodium chloride  20 mL/hr (06/07/24 1326)   [3] albuterol , aluminum-magnesium  hydroxide-simethicone, CETAPHIL, dexAMETHasone , dextrose  in water, diphenhydrAMINE , emollient combination no.92, EPINEPHrine IM, famotidine  (PEPCID ) IV, glucagon , glucose, IP okay to treat, lidocaine , lidocaine , loperamide , loperamide , LORazepam , magnesium  sulfate, methylPREDNISolone  sodium succinate , potassium chloride  in water, prochlorperazine  **OR** prochlorperazine , sodium chloride  0.9%, traMADol 

## 2024-06-17 NOTE — Plan of Care (Signed)
 Day +5 Ric Flu Mel  Shift Summary  Correctional insulin  was administered at each meal for elevated blood glucose.  Utilizing pts CGM per order. RN documented glucose results in MAR  Sepsis risk scores varied, with periods of increase and decrease, and infection prevention measures were maintained.   Fall prevention strategies were consistently implemented, including hourly checks and environmental safety measures.   CMV DNA PCR was performed in the afternoon and resulted as not detected.   The overall status remained stable with no falls or injuries and infection prevention protocols followed.   Oral mucositis noted at gumline, behind lower right front teeth.  Pt states not painful, and declined intervention. Encouraged oral hygiene, and introduced green sponges due to thrombocytopenia.   Emesis x1 improved with compazine .  Zofran  scheduled ATC    Absence of Infection Signs and Symptoms: Sepsis risk scores fluctuated throughout the shift, with periods of increase and decrease, and aseptic technique, hand hygiene, and protective precautions were consistently maintained; CMV DNA was not detected in the afternoon lab result.     Absence of Fall and Fall-Related Injury: Fall reduction interventions such as low bed position, nonskid footwear, and hourly visual checks were maintained, and no falls or injuries occurred during the shift.     Problem: Infection  Goal: Absence of Infection Signs and Symptoms  Outcome: Shift Focus  Intervention: Prevent or Manage Infection  Recent Flowsheet Documentation  Taken 06/17/2024 0834 by Melanie Burnard HERO, RN  Infection Management: aseptic technique maintained  Isolation Precautions: protective precautions maintained     Problem: Stem Cell/Bone Marrow Transplant  Goal: Diarrhea Symptom Control  Outcome: Shift Focus  Goal: Nausea and Vomiting Symptom Relief  Outcome: Shift Focus     Problem: Fall Injury Risk  Goal: Absence of Fall and Fall-Related Injury  Outcome: Shift Focus  Intervention: Identify and Manage Contributors  Recent Flowsheet Documentation  Taken 06/17/2024 0830 by Melanie Burnard HERO, RN  Self-Care Promotion:   independence encouraged   BADL personal objects within reach  Intervention: Promote Injury-Free Environment  Recent Flowsheet Documentation  Taken 06/17/2024 0834 by Melanie Burnard HERO, RN  Safety Interventions:   bleeding precautions   fall reduction program maintained   infection management   isolation precautions   low bed   neutropenic precautions   nonskid shoes/slippers when out of bed

## 2024-06-18 LAB — CBC W/ AUTO DIFF
BASOPHILS ABSOLUTE COUNT: 0 10*9/L (ref 0.0–0.1)
BASOPHILS RELATIVE PERCENT: 0 %
EOSINOPHILS ABSOLUTE COUNT: 0 10*9/L (ref 0.0–0.5)
EOSINOPHILS RELATIVE PERCENT: 11.7 %
HEMATOCRIT: 24.6 % — ABNORMAL LOW (ref 34.0–44.0)
HEMOGLOBIN: 8.5 g/dL — ABNORMAL LOW (ref 11.3–14.9)
LYMPHOCYTES ABSOLUTE COUNT: 0 10*9/L — ABNORMAL LOW (ref 1.1–3.6)
LYMPHOCYTES RELATIVE PERCENT: 48.5 %
MEAN CORPUSCULAR HEMOGLOBIN CONC: 34.6 g/dL (ref 32.0–36.0)
MEAN CORPUSCULAR HEMOGLOBIN: 32.2 pg (ref 25.9–32.4)
MEAN CORPUSCULAR VOLUME: 93.1 fL (ref 77.6–95.7)
MEAN PLATELET VOLUME: 8.7 fL (ref 6.8–10.7)
MONOCYTES ABSOLUTE COUNT: 0 10*9/L — ABNORMAL LOW (ref 0.3–0.8)
MONOCYTES RELATIVE PERCENT: 15.3 %
NEUTROPHILS ABSOLUTE COUNT: 0 10*9/L — CL (ref 1.8–7.8)
NEUTROPHILS RELATIVE PERCENT: 24.5 %
PLATELET COUNT: 32 10*9/L — ABNORMAL LOW (ref 150–450)
RED BLOOD CELL COUNT: 2.65 10*12/L — ABNORMAL LOW (ref 3.95–5.13)
RED CELL DISTRIBUTION WIDTH: 13.9 % (ref 12.2–15.2)
WBC ADJUSTED: 0.1 10*9/L — CL (ref 3.6–11.2)

## 2024-06-18 LAB — BASIC METABOLIC PANEL
ANION GAP: 12 mmol/L (ref 5–14)
BLOOD UREA NITROGEN: 11 mg/dL (ref 9–23)
BUN / CREAT RATIO: 23
CALCIUM: 8.6 mg/dL — ABNORMAL LOW (ref 8.7–10.4)
CHLORIDE: 108 mmol/L — ABNORMAL HIGH (ref 98–107)
CO2: 23 mmol/L (ref 20.0–31.0)
CREATININE: 0.48 mg/dL — ABNORMAL LOW (ref 0.55–1.02)
EGFR CKD-EPI (2021) FEMALE: >90 mL/min/1.73m2 (ref >=60)
GLUCOSE RANDOM: 223 mg/dL — ABNORMAL HIGH (ref 70–179)
POTASSIUM: 4.2 mmol/L (ref 3.4–4.8)
SODIUM: 143 mmol/L (ref 135–145)

## 2024-06-18 LAB — HEPATIC FUNCTION PANEL
ALBUMIN: 3.3 g/dL — ABNORMAL LOW (ref 3.4–5.0)
ALKALINE PHOSPHATASE: 59 U/L (ref 46–116)
ALT (SGPT): 102 U/L — ABNORMAL HIGH (ref 10–49)
AST (SGOT): 55 U/L — ABNORMAL HIGH (ref <=34)
BILIRUBIN DIRECT: 0.1 mg/dL (ref 0.00–0.30)
BILIRUBIN TOTAL: 0.3 mg/dL (ref 0.3–1.2)
PROTEIN TOTAL: 5.7 g/dL (ref 5.7–8.2)

## 2024-06-18 LAB — TACROLIMUS LEVEL, TROUGH: TACROLIMUS, TROUGH: 6.8 ng/mL (ref 5.0–15.0)

## 2024-06-18 LAB — IGG: GAMMAGLOBULIN; IGG: 609 mg/dL — ABNORMAL LOW (ref 650–1600)

## 2024-06-18 LAB — MAGNESIUM: MAGNESIUM: 1.6 mg/dL (ref 1.6–2.6)

## 2024-06-18 MED ADMIN — famotidine (PEPCID) tablet 20 mg: 20 mg | ORAL | @ 14:00:00

## 2024-06-18 MED ADMIN — famotidine (PEPCID) tablet 20 mg: 20 mg | ORAL | @ 01:00:00

## 2024-06-18 MED ADMIN — escitalopram oxalate (LEXAPRO) tablet 10 mg: 10 mg | ORAL | @ 14:00:00

## 2024-06-18 MED ADMIN — loperamide (IMODIUM) capsule 2 mg: 2 mg | ORAL

## 2024-06-18 MED ADMIN — insulin lispro (HumaLOG) injection 0-20 Units: 0-20 [IU] | SUBCUTANEOUS | @ 22:00:00

## 2024-06-18 MED ADMIN — fluconazole (DIFLUCAN) tablet 400 mg: 400 mg | ORAL | @ 14:00:00 | Stop: 2024-07-12

## 2024-06-18 MED ADMIN — letermovir (PREVYMIS) tablet 480 mg: 480 mg | ORAL | @ 01:00:00

## 2024-06-18 MED ADMIN — magnesium sulfate 2gm/50mL IVPB: 2 g | INTRAVENOUS | @ 11:00:00

## 2024-06-18 MED ADMIN — magnesium sulfate 2gm/50mL IVPB: 2 g | INTRAVENOUS | @ 09:00:00

## 2024-06-18 MED ADMIN — prochlorperazine (COMPAZINE) injection 10 mg: 10 mg | INTRAVENOUS | @ 02:00:00

## 2024-06-18 MED ADMIN — prochlorperazine (COMPAZINE) injection 10 mg: 10 mg | INTRAVENOUS | @ 16:00:00

## 2024-06-18 MED ADMIN — insulin glargine (LANTUS) injection BASAL 18 Units: 18 [IU] | SUBCUTANEOUS | @ 16:00:00 | Stop: 2024-06-18

## 2024-06-18 MED ADMIN — cetirizine (ZYRTEC) tablet 10 mg: 10 mg | ORAL | @ 14:00:00

## 2024-06-18 MED ADMIN — valACYclovir (VALTREX) tablet 500 mg: 500 mg | ORAL | @ 14:00:00

## 2024-06-18 MED ADMIN — valACYclovir (VALTREX) tablet 500 mg: 500 mg | ORAL | @ 01:00:00

## 2024-06-18 MED ADMIN — insulin lispro (HumaLOG) injection CORRECTIONAL 0-20 Units: 0-20 [IU] | SUBCUTANEOUS | @ 16:00:00

## 2024-06-18 MED ADMIN — insulin lispro (HumaLOG) injection CORRECTIONAL 0-20 Units: 0-20 [IU] | SUBCUTANEOUS | @ 22:00:00

## 2024-06-18 MED ADMIN — tacrolimus (PROGRAF) 1.5mg combo product: 1.5 mg | ORAL | @ 14:00:00

## 2024-06-18 MED ADMIN — levoFLOXacin (LEVAQUIN) tablet 500 mg: 500 mg | ORAL | @ 14:00:00 | Stop: 2024-07-12

## 2024-06-18 MED ADMIN — ondansetron (ZOFRAN) tablet 8 mg: 8 mg | ORAL | @ 18:00:00

## 2024-06-18 MED ADMIN — ondansetron (ZOFRAN) tablet 8 mg: 8 mg | ORAL | @ 22:00:00

## 2024-06-18 MED ADMIN — tacrolimus (PROGRAF) capsule 2 mg: 2 mg | ORAL | @ 01:00:00

## 2024-06-18 MED ADMIN — sodium chloride 0.9% (NS) bolus 1,000 mL: 1000 mL | INTRAVENOUS | @ 16:00:00 | Stop: 2024-06-18

## 2024-06-18 MED ADMIN — methotrexate (Preservative Free) injection: 5 mg/m2 | INTRAVENOUS | @ 18:00:00 | Stop: 2024-06-18

## 2024-06-18 NOTE — Progress Notes (Signed)
 BONE MARROW TRANSPLANT AND CELLULAR THERAPY PROGRESS NOTE    [name]  MRN: [id number]  Encounter Date: 06/18/24    Referring physician:  Dr. Toribio Bunker  BMT Attending MD: Dr. 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 Medrith now presents for a matched unrelated donor stem cell transplant.      Interval History:  - No acute events overnight.   - Continues with some nausea and vomiting this morning. Encouraged sched Zofran  TID and Zyprexa  HS for better control. Trying to avoid Compazine  in setting of recent vasovagal event.  - Had 4 episodes of diarrhea over the last 24h. Last episode with some consistency. Continue Imodium  PRN.   - PO intake 1.2 L. Will repeat 1L bolus this morning. Weight change: 0.583 kg (1 lb 4.6 oz)  - Fasting BG 150s this morning per Dexcom. Continues with prandial spikes > 300s. Patient interested in carb counting to manage her sugars. Consulted Endocrinology.     Review of Systems:  A full system review was performed and was negative except as noted in the above interval history.    Temp:  [36.7 ??C (98.1 ??F)-37.2 ??C (99 ??F)] 36.7 ??C (98.1 ??F)  Pulse:  [82-112] 112  Resp:  [16-18] 16  BP: (113-133)/(75-95) 114/83  MAP (mmHg):  [87-104] 91  SpO2:  [95 %-100 %] 96 %    I/O last 3 completed shifts:  In: 3153 [P.O.:1952; I.V.:1151; IV Piggyback:50]  Out: 3600 [Urine:3600]  No intake/output data recorded.    Last 5 Recorded Weights    06/13/24 2017 06/14/24 1945 06/15/24 2008 06/16/24 2037   Weight: 78.5 kg (173 lb) 77.7 kg (171 lb 6.4 oz) 77.2 kg (170 lb 3.1 oz) 77.3 kg (170 lb 6.7 oz)    06/17/24 2000   Weight: 77.9 kg (171 lb 11.2 oz)     Weight change: 0.583 kg (1 lb 4.6 oz)    Test Results:   Reviewed in EPIC. Abnormal values discussed below.     Scheduled Meds:Scheduled Medications[1]  Continuous Infusions:Infusions Meds[2]  PRN Meds:.PRN Medications[3]    Physical Exam:   General : Sitting in bed. Appears in no acute distress.  Central venous access: Line clean, dry, intact. No erythema or drainage noted.   ENT: Moist mucous membranes. Oropharhynx without erythema or exudate. Lesion to her lower gums, behind incisors.  Cardiovascular: Pulse normal rate, regularity and rhythm. S1 and S2 normal, without any murmur, rub, or gallop.  Lungs: Clear to auscultation bilaterally, without wheezes/crackles/rhonchi. Good air movement.   Skin: Pale. Warm, dry, intact. No rash noted.   Psychiatry: Alert and oriented to person, place, and time.   Gastrointestinal: Normoactive bowel sounds, abdomen soft, non-tender   Musculoskeletal/Extremities: Full range of motion in shoulder, elbow, hip knee, ankle, wrists and feet. No edema.   Neurologic: CNII-XII intact. Normal strength and sensation throughout    Assessment/Plan:    BMT: ALL  CR MRD Negative  HCT-CI (age adjusted) 49 (age, anxiety, reduced DLCO, HF (reduced EF), T2DM, CMV viremia)  Conditioning:.  1. Fludarabine 40 mg/m2 days -5, -4, -3, -2  2. Melphalan 100  mg/m2 day -1      Donor: 10/10, Female, ABO O-, CMV negative  Engraftment: G-CSF starting Day +  5 through WBC recovery (as defined as ANC 1.0 x 2 days or 3.0 x 1 day)  -Date of last G-CSF injection: TBD     GVHD prophylaxis:   1.Tacrolimus starting on day -3 (goal 5-10 ng/mL)  2. Methotrexate  5 mg/m2 IVP on days +1, +3, +6 and +11  3.  ATG per [facility] standard dosing will not be included     Hem: Transfusion criteria: Transfuse 1 unit of PRBCs for hemoglobin <7 and 1 unit of platelets for Plt <10K or bleeding. [name] [name] does not have a history of transfusion reactions. Consent was obtained and documented on 06/05/24.    ID:   Prophylaxis:  -Antiviral: Valtrex  500 mg po BID started on admission and continue for 2 year      Letermovir  indicated as R/CMV + D/CMV - and with prior hx of CMV viremia, plan to continue on admission through at least day +200  -Antifungal: Fluconazole  400 mg po daily to start day 0 thorugh day +75  -Antibacterial: Levaquin  500 mg po daily to start day 0, continue while neutropenic  -PJP: Sulfa allergic. Dapsone  daily upon platelet engraftment, has tolerated this well in the past. Toxo PCR negative.   -11/7 stopped Dapsone  upon admission, restart with platelet engraftment.      Patient was screened for upper respiratory symptoms, including S/S of Covid-19. bmt prescreen symptoms: Patient reports upper respiratory symptoms.  bmtu rpp testing: RPP to be ordered and performed ahead of BMTU admission. Patient educated on continued need to inform medical team of any new symptoms.     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   Previously required Valcyte  treatment  -Continue Letermovir  and Valtrex  BID through day +200 post SCT.      Recurrent Acute Sinusitis  Recurrent acute sinusitis with nasal congestion, frontal headache, and low-grade fever. Responded well to Augmentin .  -11/7 current sx resolving, started Augmentin  10 days (10/30-11/9)     Hypogammaglobulinemia  Monthly IVIG - last on 05/16/2024  - 11/7 check weekly IgG levels     GI:   GERD prophylaxis:  -Pepcid  BID     Nausea:  -Anti-emetics per BMT protocol.   - 06/14/24: Zyprexa  5 mg nightly      Diarrhea:  -Antidiarrheals per BMT protocol    Renal: Creatinine normal. No issues.      FEN:    Electrolyte replacement per protocol  - 11/15 - 11/18: 1L NS bolus x1    Hepatic: Normal bilirubin. SOS prophylaxis not indicated with RIC regimen.   - 06/17/24: Rise in AST/ALT, monitor LFTs daily     CV: Followed by [facility] Cardiology: Heart failure with mildly reduced ejection fraction (HFmrEF)   Hx of EF 45-50%, most recent Echo 05/23/24 improved to 55-60%  Hx of prolonged QTc, last EKG 489 follow closely with addition of QTc prolonging medications  -11/7 QTc 457. Check weekly EKGs (next on 11/14), added cardiac lytes   -11/8: 40 meq of K  -11/9: 20 meq of K     Hypertension: Home med Losartan  50 mg daily.     FALL:  -11/14: Lightheadedness with a fall overnight 2am. (-) stat head CT w/o contrast. Neuro exam intact  -11/16: Feeling well, denies N/V, HA/lightheaded.    Pulm: No pulmonary symptoms currently. Reduced DLCO      Endocrine:   Menses suppression: Lupron  last 07/26/22.  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 At home uses Dexcom for monitoring.  - 11/7 ACHS and SSI while inpatient. Lantus  25 units in AM. Can consider using Dexcom next week if numbers similar to hospital glucometer results   - 11/8: holding Lantus  due to morning POC 89  - 11/9: Blood glucose range 131 -189, resumed Lantus  at lower dose 10 Units  - 11/11: BG 200-400s in setting of steroids. Increased Lantus  to 15u.  - 11/12: BG 300s. Increased Lantus  to 18u.   - 11/16: BG range 288-363 last 24 hours, adjust SSI insulin  sensitivity factor  - 11/18: Consulted Endocrinology    Neuro/Pain:  - PRN tramadol  50 mg added. No oxy as this give patient migraines   - 11/8: Tramadol  was not helping, Morphine  with mild relief. Ordered dilaudid  0.5mg  q8 prn x 2 days for severe pain, removed tramadol  prn.  - 11/11: Tramadol  50mg  Q6H ordered PRN with goal to transition to oral medications for pain control.  - 11/12: Discontinued Dilaudid . Manage chronic hip/back pain with Tramadol  q6h PRN + heat therapy.     Psych: Followed by Palliative Care     Anxiety: Home med: Lexapro  10 mg daily   Reconnected with her local therapist   - If pt continues to endorse anxiety, may increase Lexapro  to 20 mg daily  - 11/7 added PRN ativan  0.5 mg Q8     Caregivers: Lives in [place], [place]  Primary caregiver: Jeanie Castonguay (mom)- first 30 days at [facility] then home if doing well will stay longer if medically needed  Secondary caregiver: Devaughn Ranger (husband)     Consults: While inpatient, consider consults to music therapy, integrative medicine and massage therapy.       Tonya Wantz Jo-an Elva Levy, ACNP  [facility]               [1]    cetirizine   10 mg Oral Daily    escitalopram  oxalate  10 mg Oral Daily    famotidine   20 mg Oral BID    [START ON 06/24/2024] filgrastim   480 mcg Subcutaneous Q24H    fluconazole   400 mg Oral Daily    heparin , porcine (PF)  2 mL Intravenous Mon,Wed,Fri    insulin  glargine  18 Units Subcutaneous Daily    insulin  lispro  0-20 Units Subcutaneous TID AC    letermovir   480 mg Oral Nightly    levoFLOXacin   500 mg Oral Daily    losartan   50 mg Oral Daily    methotrexate  (Preservative Free)  5 mg/m2 (Treatment Plan Adjusted) Intravenous Once    [START ON 06/23/2024] methotrexate  (Preservative Free)  5 mg/m2 (Treatment Plan Adjusted) Intravenous Once    OLANZapine   5 mg Oral Nightly    ondansetron   8 mg Oral TID    tacrolimus  1.5 mg Oral Daily    tacrolimus  2 mg Oral Nightly    valACYclovir   500 mg Oral BID   [2]    IP okay to treat      sodium chloride  20 mL/hr (06/07/24 1326)   [3] albuterol , aluminum-magnesium  hydroxide-simethicone, CETAPHIL, dexAMETHasone , dextrose  in water, diphenhydrAMINE , emollient combination no.92, EPINEPHrine IM, famotidine  (PEPCID ) IV, glucagon , glucose, IP okay to treat, lidocaine , lidocaine , loperamide , loperamide , LORazepam , magnesium  sulfate, methylPREDNISolone  sodium succinate , potassium chloride  in water, prochlorperazine  **OR** prochlorperazine , sodium chloride  0.9%, traMADol 

## 2024-06-18 NOTE — Plan of Care (Signed)
 Patient A&O X4, VSS, afebrile throughout shift. No acute events throughout shift. Plan of care reviewed with patient and applicable visitors, safety measures in place throughout shift. 1L bolus administered as ordered. Patient refused morning insulin  lispro, provider aware. Patient refuses POC glucose testing, nursing is using patient's dexcom (CGM) to assess blood glucose and administer insulin  accordingly, provider aware. Methotrexate  administered as ordered without complications.       Shift Summary  Nausea and vomiting were treated with PRN prochlorperazine , and comfort was maintained with pain scores at 0.  Sepsis risk scores fluctuated but returned to lower values, and infection prevention protocols were consistently followed.  Patient remained able to feed herself and oral comfort was supported throughout the shift.  Overall, patient maintained comfort and infection prevention measures, with blood glucose trending downward but remaining above target range.    Optimal Comfort and Wellbeing: Pain scores remained at 0 throughout the shift and nausea/vomiting was addressed with prochlorperazine  administration; patient was able to feed herself and oral comfort was promoted.    Absence of Infection Signs and Symptoms: Sepsis risk scores fluctuated but returned to lower values by the end of the shift; infection management and prevention interventions were maintained, including isolation and neutropenic precautions.    Blood Glucose Level Within Target Range: Blood glucose levels decreased from 331 mg/dL in the morning to 211 mg/dL by late afternoon following administration of insulin  lispro, though levels remained above target range throughout the shift.

## 2024-06-18 NOTE — Consults (Signed)
 Tacrolimus Therapeutic Monitoring Pharmacy Note    Patricia [name] is a 48 y.o. 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 She is continuing tacrolimus and MTX for GVHD prophylaxis.     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     Date of Transplant: 06/12/24      Prior Dosing Information: see table below     Source(s) of information used to determine prior to admission dosing: MAR    Goals:  Therapeutic Drug Levels  Tacrolimus trough goal: 5-10 ng/mL    Additional Clinical Monitoring/Outcomes  Monitor renal function (SCr and urine output) and liver function (LFTs)  Monitor for signs/symptoms of adverse events (e.g., hyperglycemia, hyperkalemia, hypomagnesemia, hypertension, headache, tremor)    Previous Lab Results:  Tacrolimus, Trough   Date Value Ref Range Status   06/18/2024 6.8 5.0 - 15.0 ng/mL Final   06/16/2024 3.1 (L) 5.0 - 15.0 ng/mL Final   06/14/2024 9.4 5.0 - 15.0 ng/mL Final   06/12/2024 10.7 5.0 - 15.0 ng/mL Final   06/11/2024 16.1 (H) 5.0 - 15.0 ng/mL Final     Creatinine   Date Value Ref Range Status   06/17/2024 0.48 (L) 0.55 - 1.02 mg/dL Final   88/83/7974 9.54 (L) 0.55 - 1.02 mg/dL Final   88/84/7974 9.44 0.55 - 1.02 mg/dL Final      Result:  Tacrolimus level from today was drawn appropriately     Pharmacokinetic Considerations and Significant Drug Interactions:  Concurrent CYP3A4 substrates/inhibitors: fluconazole  (moderate inhibitor, started 11/12), letermovir  (moderate inhibitor, initiated 05/2023)    Assessment/Plan:  Recommendation(s)  Trough is now therapeutic at 6.8 today and appropriately increased following dose increase  Fluconazole  recently started on 11/12 for fungal PPX. As fluconazole  reaches steady state, will continue to affect tacrolimus levels. Letermovir  has been at steady state and should not have a continued affect on tacrolimus levels  SCr stable/WNL  Bili/LFTs from 11/17 notable for elevated AST/ALT however downtrending  Given the above, plan to continue tacrolimus 1.5 mg qAM and 2 mg qPM.    Follow-up  Next level has been ordered on 11/20 at 0800.   A pharmacist will continue to monitor and recommend levels as appropriate    Longitudinal Dose Monitoring:  Date Dose (mg), Route AM Scr (mg/dL) Level  (ng/mL) Key Drug Interactions   11/9 3.5/3.5 0.51  Letermovir    11/10 3.5/3.5 0.44  Letermovir    11/11 2.5/--- 0.51 16.1 Letermovir    11/12 ---/--- 0.53 10.7 Letermovir , Fluconazole  started   11/13 2/2 0.52 -- Letermovir , Fluconazole    11/14 2/-- 0.55 9.4 Letermovir , Fluconazole    11/15 1.5/1.5 0.48  Letermovir , Fluconazole    11/16 1.5/2 0.55 3.1 Letermovir , Fluconazole     11/17 1.5/2 0.48  Letermovir , Fluconazole     11/18 1.5/2  6.8 Letermovir , Fluconazole       Please page service pharmacist with questions/clarifications.    Thom Baptise, PharmD, BCPS, BCOP  BMT Clinical Pharmacist

## 2024-06-18 NOTE — Plan of Care (Signed)
 Pt day +6 from Allo SCT. Afebrile, other VSS on room air. Labs drawn, Mag replacement infusing. No blood products needed. PRN imodium  given for diarrhea and compazine  for nausea. Pt free from falls/injuries this shift.     Shift Summary  Magnesium  sulfate was administered twice during the shift in response to low magnesium  levels.   Prochlorperazine  was given PRN for non-pain related symptoms, and OLANZapine  and ondansetron  were refused.   Laboratory results indicated multiple low blood counts and low IgG, with infection prevention and neutropenic precautions maintained.   No falls or injuries occurred, and the environment remained safe throughout the shift.   Overall, safety interventions and infection prevention measures were consistently implemented.     Absence of Hospital-Acquired Illness or Injury: Aseptic technique and infection prevention measures were consistently maintained, and neutropenic and isolation precautions were followed throughout the shift; no new hospital-acquired issues were documented. Laboratory results showed low WBC and neutrophil counts, and IgG was low, with magnesium  sulfate administered for low magnesium  levels.     Absence of Fall and Fall-Related Injury: Bed was kept in the lowest position with wheels locked, side rails up, and nonskid footwear on at all times; hourly visual checks confirmed safety and no falls occurred. No alarms were present or activated during the shift.     Improved Ability to Complete Activities of Daily Living: Mobility was slightly limited and activity was occasional walking, with no change noted during the shift. Pain remained at zero throughout, and no interventions for pain were required.

## 2024-06-18 NOTE — Consults (Signed)
 Endocrine Team Diabetes New Consult Note     Consult information:  Requesting Attending Physician : Venetia Dasie Craft, MD  Service Requesting Consult : Bone Marrow (MDT)  Primary Care Provider: Estelle Toribio SAUNDERS, MD  Impression:  Patricia [name] is a 48 y.o. 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 She is day 6 post BMT. We have been consulted at the request of Venetia Dasie Craft, MD to evaluate Patricia [name] for hyperglycemia.     Medical Decision Making:  Diagnoses:  1.Type 2 Diabetes. Uncontrolled With hyperglycemia.  2. Nutrition: Complicating glycemic control. Increasing risk for both hypoglycemia and hyperglycemia.    Studies reviewed 06/18/24:  Labs: CBC, BMP, POCT-BG, and HbA1C  Interpretation:   Fasting BG 238 on 11/18.   Glucose ranged mid-100s to mid-200s in the last 48 hours.   Normal renal function.   Notes reviewed: Primary team and outpatient endocrinology notes      Overall impression based on above reviews and history:  Type 2 diabetes mellitus with hyperglycemia in setting of reduced dosage during her stem cell transplantation. Her insulin  has been titrated over the last several days with some improvement. Fasting glucose elevated to 238 and she would benefit from further titration of her basal insulin . She is also experiencing prandial spikes and would benefit from restarting her home nutritional insulin  regimen. She is expected to have a decrease in her oral intake over the next week due to GI side effects from BMT regimen and may need decrease of her basal insulin  at that time.     Recommendations:  - INCREASE basal insulin  Lantus  20 units daily (from 18 units daily)  - START nutritional insulin  Lispro TID, carb counting 1:8 (home regimen) administered prior to meals   - CONTINUE correctional insulin  Lispro, ISF 20  - Point of care glucose testing achs. Patient preference to use her CGM (Dexcom 7) noted and we discussed that staff will still be attempting POC checks per hospital policy  - Hypoglycemia protocol  - Ensure patient is on glucose precautions if patient taking nutrition by mouth.   - May require decrease in her basal insulin  with the expected decrease in her oral intake in the coming week    Thank you for this consult. Discussed plan with primary team.     We will continue to follow and make recommendations. Endocrine consult team will place insulin  orders per primary team preference.     Please page with questions or concerns: Endocrine fellow on call: [phone]    A. Unk Parson, MD  PGY2 Internal Medicine     __________    Subjective:  Initial HPI:  Patricia [name] is a 48 y.o. 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 We have been consulted at the request of Venetia Dasie Craft, MD to evaluate Patricia [name] for hyperglycemia.     She reports frustration with the lack of control over her glucose checks and insulin  administration in the hospital. She has been doing carb counting at home with ratio 1:8 and would like to be able to receive nutritional insulin  prior to meals.     Receiving 10-18 units of Lispro daily on top of Lantus  18 units daily.   Fasting BG 238 on 11/18.   Normal renal function.     Diabetes History:  Patient has a history of Type 2 diabetes diagnosed  09/2023.  Diabetes is managed by: [facility] (Dr. 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    Scheduled Medications[1]    Current Outpatient Medications   Medication Instructions    albuterol  HFA 90 mcg/actuation inhaler 2 puffs, Inhalation, Every 6 hours PRN    aspirin  (ECOTRIN) 81 mg, Daily (standard)    blood sugar diagnostic (GLUCOSE BLOOD) Strp Use to check blood sugar two times a day.    blood-glucose meter kit Use to check blood sugar two times a day. Use as instructed.    blood-glucose sensor (DEXCOM G7 SENSOR) Devi 1 each, Miscellaneous, Every 10 days    cetirizine  (ZYRTEC ) 10 mg, Daily (standard)    dapsone  100 mg, Oral, Daily (standard), TAKE 1 TABLET(100 MG) BY MOUTH DAILY    escitalopram  oxalate (LEXAPRO ) 10 mg, Oral, Daily (standard)    insulin  aspart (NOVOLOG  FLEXPEN) 100 unit/mL (3 mL) injection pen Use up to 30 units/day, divided TID AC meals as per MD instructions    lancets Misc Use to check blood sugar as directed 2 times a day & for symptoms of high or low blood sugar.    LANTUS  SOLOSTAR U-100 INSULIN  25 Units, Subcutaneous, Daily (standard)    letermovir  (PREVYMIS ) 480 mg, Oral, At bedtime    LORazepam  (ATIVAN ) 0.5 mg, Oral, Every 8 hours PRN    losartan  (COZAAR ) 50 mg, Oral, Daily (standard)    valACYclovir  (VALTREX ) 500 mg, Oral, Daily (standard)     Past Medical History[2]    Past Surgical History[3]    Family History[4]    Short Social History[5]    OBJECTIVE:  BP 114/80  - Pulse 97  - Temp 36.9 ??C (98.4 ??F)  - Resp 16  - Ht 168 cm (5' 6.14)  - Wt 77.9 kg (171 lb 11.2 oz)  - SpO2 99%  - BMI 27.59 kg/m??   Wt Readings from Last 12 Encounters:   06/17/24 77.9 kg (171 lb 11.2 oz)   06/05/24 79.7 kg (175 lb 11.3 oz)   05/29/24 78.8 kg (173 lb 11.6 oz)   05/29/24 79.8 kg (175 lb 14.8 oz)   05/23/24 78.7 kg (173 lb 6.4 oz)   05/17/24 79.6 kg (175 lb 9.5 oz)   05/16/24 79.9 kg (176 lb 2.4 oz)   05/09/24 80.4 kg (177 lb 4 oz)   05/02/24 81.4 kg (179 lb 5.5 oz)   04/25/24 81.7 kg (180 lb 0.1 oz)   04/18/24 81.9 kg (180 lb 9.6 oz)   04/16/24 82.7 kg (182 lb 6.4 oz)     Physical Exam  Constitutional: General: She is not in acute distress.     Appearance: Normal appearance.   HENT:      Mouth/Throat:      Mouth: Mucous membranes are moist.   Pulmonary:      Effort: Pulmonary effort is normal.   Neurological:      Mental Status: She is alert.         Glucose summary.  Glucose, POC (mg/dL)   Date Value   88/82/7974 238 (H)   06/16/2024 167   06/16/2024 324 (H)   06/16/2024 364 (H)   06/16/2024 183 (H)   06/15/2024 297 (H)   06/15/2024 358 (H)   06/15/2024 363 (H)        Summary of labs:  Lab Results   Component Value Date    A1C 5.0 03/22/2024    A1C 7.1 (H) 10/17/2023    A1C <3.8 (L) 11/15/2022     Lab Results   Component Value Date    CREATININE 0.48 (L) 06/17/2024     Lab Results   Component Value Date    WBC 0.1 (LL) 06/17/2024    HGB 8.5 (L) 06/17/2024    HCT 24.6 (L) 06/17/2024    PLT 32 (L) 06/17/2024       Lab Results   Component Value Date    NA 143 06/17/2024    K 4.2 06/17/2024    CL 108 (H) 06/17/2024    CO2 23.0 06/17/2024    BUN 11 06/17/2024    CREATININE 0.48 (L) 06/17/2024    GLU 223 (H) 06/17/2024    CALCIUM  8.6 (L) 06/17/2024    MG 1.6 06/17/2024    PHOS 3.8 06/16/2024       Lab Results   Component Value Date    BILITOT 0.3 06/17/2024    BILIDIR 0.10 06/17/2024    PROT 5.7 06/17/2024    ALBUMIN 3.3 (L) 06/17/2024    ALT 102 (H) 06/17/2024    AST 55 (H) 06/17/2024    ALKPHOS 59 06/17/2024    GGT 836 (H) 11/25/2023       Lab Results   Component Value Date    INR 1.05 06/16/2024    APTT 26.6 06/16/2024          [1]    cetirizine   10 mg Oral Daily    escitalopram  oxalate  10 mg Oral Daily    famotidine   20 mg Oral BID    [START ON 06/24/2024] filgrastim   480 mcg Subcutaneous Q24H    fluconazole   400 mg Oral Daily    heparin , porcine (PF)  2 mL Intravenous Mon,Wed,Fri    insulin  glargine  18 Units Subcutaneous Daily    insulin  lispro  0-20 Units Subcutaneous TID AC    letermovir   480 mg Oral Nightly    levoFLOXacin   500 mg Oral Daily    losartan   50 mg Oral Daily    [START ON 06/23/2024] methotrexate  (Preservative Free)  5 mg/m2 (Treatment Plan Adjusted) Intravenous Once    OLANZapine   5 mg Oral Nightly    ondansetron   8 mg Oral TID    tacrolimus  1.5 mg Oral Daily    tacrolimus  2 mg Oral Nightly    valACYclovir   500 mg Oral BID   [2]   Past Medical History:  Diagnosis Date    Anxiety     B-cell acute lymphoblastic leukemia (ALL)    (CMS-HCC) 10/29/2021    Cytomegalovirus (CMV) viremia    (CMS-HCC)     Diabetes mellitus (CMS-HCC)     Hypertension    [3]   Past Surgical History:  Procedure Laterality Date    BREAST CYST EXCISION Right     age 81    CHEMOTHERAPY      leukemia    IR INSERT PORT AGE GREATER THAN 5 YRS  12/08/2021    IR INSERT PORT AGE GREATER THAN 5 YRS 12/08/2021  Charmaine Arloa Provencal, PA IMG VIR [facility]   [4] History reviewed. No pertinent family history.  [5]   Social History  Tobacco Use    Smoking status: Former     Current packs/day: 0.00     Types: Cigarettes     Quit date: 11/12/2015     Years since quitting: 8.6    Smokeless tobacco: Never   Vaping Use    Vaping status: Former   Substance Use Topics    Alcohol use: Not Currently    Drug use: Not Currently

## 2024-06-19 LAB — URINALYSIS WITH MICROSCOPY
BACTERIA: NONE SEEN /HPF
BILIRUBIN UA: NEGATIVE
GLUCOSE UA: 200 — AB
HYALINE CASTS: 3 /LPF — ABNORMAL HIGH (ref 0–1)
KETONES UA: NEGATIVE
LEUKOCYTE ESTERASE UA: NEGATIVE
NITRITE UA: NEGATIVE
PH UA: 6 (ref 5.0–9.0)
PROTEIN UA: NEGATIVE
RBC UA: 2 /HPF (ref <=4)
SPECIFIC GRAVITY UA: 1.016 (ref 1.003–1.030)
SQUAMOUS EPITHELIAL: <1 /HPF (ref 0–5)
UROBILINOGEN UA: <2
WBC UA: 1 /HPF (ref 0–5)

## 2024-06-19 LAB — CBC W/ AUTO DIFF
BASOPHILS ABSOLUTE COUNT: 0 10*9/L (ref 0.0–0.1)
BASOPHILS RELATIVE PERCENT: 0 %
EOSINOPHILS ABSOLUTE COUNT: 0 10*9/L (ref 0.0–0.5)
EOSINOPHILS RELATIVE PERCENT: 6.2 %
HEMATOCRIT: 24.2 % — ABNORMAL LOW (ref 34.0–44.0)
HEMOGLOBIN: 8.5 g/dL — ABNORMAL LOW (ref 11.3–14.9)
LYMPHOCYTES ABSOLUTE COUNT: 0 10*9/L — ABNORMAL LOW (ref 1.1–3.6)
LYMPHOCYTES RELATIVE PERCENT: 73.1 %
MEAN CORPUSCULAR HEMOGLOBIN CONC: 35.1 g/dL (ref 32.0–36.0)
MEAN CORPUSCULAR HEMOGLOBIN: 32.4 pg (ref 25.9–32.4)
MEAN CORPUSCULAR VOLUME: 92.3 fL (ref 77.6–95.7)
MEAN PLATELET VOLUME: 8.6 fL (ref 6.8–10.7)
MONOCYTES ABSOLUTE COUNT: 0 10*9/L — ABNORMAL LOW (ref 0.3–0.8)
MONOCYTES RELATIVE PERCENT: 15.9 %
NEUTROPHILS ABSOLUTE COUNT: 0 10*9/L — CL (ref 1.8–7.8)
NEUTROPHILS RELATIVE PERCENT: 4.8 %
PLATELET COUNT: 23 10*9/L — ABNORMAL LOW (ref 150–450)
RED BLOOD CELL COUNT: 2.63 10*12/L — ABNORMAL LOW (ref 3.95–5.13)
RED CELL DISTRIBUTION WIDTH: 13.7 % (ref 12.2–15.2)
WBC ADJUSTED: 0.1 10*9/L — CL (ref 3.6–11.2)

## 2024-06-19 LAB — BASIC METABOLIC PANEL
ANION GAP: 10 mmol/L (ref 5–14)
BLOOD UREA NITROGEN: 14 mg/dL (ref 9–23)
BUN / CREAT RATIO: 20
CALCIUM: 8.9 mg/dL (ref 8.7–10.4)
CHLORIDE: 111 mmol/L — ABNORMAL HIGH (ref 98–107)
CO2: 25 mmol/L (ref 20.0–31.0)
CREATININE: 0.69 mg/dL (ref 0.55–1.02)
EGFR CKD-EPI (2021) FEMALE: >90 mL/min/1.73m2 (ref >=60)
GLUCOSE RANDOM: 148 mg/dL (ref 70–179)
POTASSIUM: 4.3 mmol/L (ref 3.4–4.8)
SODIUM: 146 mmol/L — ABNORMAL HIGH (ref 135–145)

## 2024-06-19 LAB — HEPATIC FUNCTION PANEL
ALBUMIN: 3.2 g/dL — ABNORMAL LOW (ref 3.4–5.0)
ALKALINE PHOSPHATASE: 62 U/L (ref 46–116)
ALT (SGPT): 84 U/L — ABNORMAL HIGH (ref 10–49)
AST (SGOT): 35 U/L — ABNORMAL HIGH (ref <=34)
BILIRUBIN DIRECT: <0.1 mg/dL (ref 0.00–0.30)
BILIRUBIN TOTAL: 0.2 mg/dL — ABNORMAL LOW (ref 0.3–1.2)
PROTEIN TOTAL: 5.9 g/dL (ref 5.7–8.2)

## 2024-06-19 LAB — MAGNESIUM: MAGNESIUM: 1.5 mg/dL — ABNORMAL LOW (ref 1.6–2.6)

## 2024-06-19 MED ADMIN — famotidine (PEPCID) tablet 20 mg: 20 mg | ORAL | @ 14:00:00

## 2024-06-19 MED ADMIN — famotidine (PEPCID) tablet 20 mg: 20 mg | ORAL | @ 01:00:00

## 2024-06-19 MED ADMIN — escitalopram oxalate (LEXAPRO) tablet 10 mg: 10 mg | ORAL | @ 14:00:00

## 2024-06-19 MED ADMIN — loperamide (IMODIUM) capsule 2 mg: 2 mg | ORAL | @ 22:00:00

## 2024-06-19 MED ADMIN — insulin lispro (HumaLOG) injection 0-20 Units: 0-20 [IU] | SUBCUTANEOUS | @ 17:00:00

## 2024-06-19 MED ADMIN — insulin glargine (LANTUS) injection BASAL 20 Units: 20 [IU] | SUBCUTANEOUS | @ 14:00:00

## 2024-06-19 MED ADMIN — fluconazole (DIFLUCAN) tablet 400 mg: 400 mg | ORAL | @ 14:00:00 | Stop: 2024-07-12

## 2024-06-19 MED ADMIN — letermovir (PREVYMIS) tablet 480 mg: 480 mg | ORAL | @ 01:00:00

## 2024-06-19 MED ADMIN — heparin, porcine (PF) 100 unit/mL injection 2 mL: 2 mL | INTRAVENOUS | @ 04:00:00

## 2024-06-19 MED ADMIN — magnesium sulfate 2gm/50mL IVPB: 2 g | INTRAVENOUS | @ 09:00:00

## 2024-06-19 MED ADMIN — magnesium sulfate 2gm/50mL IVPB: 2 g | INTRAVENOUS | @ 12:00:00

## 2024-06-19 MED ADMIN — prochlorperazine (COMPAZINE) injection 10 mg: 10 mg | INTRAVENOUS | @ 11:00:00

## 2024-06-19 MED ADMIN — cetirizine (ZYRTEC) tablet 10 mg: 10 mg | ORAL | @ 14:00:00

## 2024-06-19 MED ADMIN — valACYclovir (VALTREX) tablet 500 mg: 500 mg | ORAL | @ 14:00:00

## 2024-06-19 MED ADMIN — valACYclovir (VALTREX) tablet 500 mg: 500 mg | ORAL | @ 01:00:00

## 2024-06-19 MED ADMIN — insulin lispro (HumaLOG) injection CORRECTIONAL 0-20 Units: 0-20 [IU] | SUBCUTANEOUS | @ 17:00:00

## 2024-06-19 MED ADMIN — insulin lispro (HumaLOG) injection CORRECTIONAL 0-20 Units: 0-20 [IU] | SUBCUTANEOUS | @ 14:00:00

## 2024-06-19 MED ADMIN — tacrolimus (PROGRAF) 1.5mg combo product: 1.5 mg | ORAL | @ 14:00:00

## 2024-06-19 MED ADMIN — levoFLOXacin (LEVAQUIN) tablet 500 mg: 500 mg | ORAL | @ 14:00:00 | Stop: 2024-07-12

## 2024-06-19 MED ADMIN — ondansetron (ZOFRAN) tablet 8 mg: 8 mg | ORAL | @ 22:00:00

## 2024-06-19 MED ADMIN — ondansetron (ZOFRAN) tablet 8 mg: 8 mg | ORAL | @ 17:00:00

## 2024-06-19 MED ADMIN — ondansetron (ZOFRAN) tablet 8 mg: 8 mg | ORAL | @ 13:00:00

## 2024-06-19 MED ADMIN — magic mouthwash oral suspension (diphenhydrAMINE, nystatin) 10 mL: 10 mL | ORAL | @ 19:00:00

## 2024-06-19 MED ADMIN — lidocaine (XYLOCAINE) 2% viscous mucosal solution: 10 mL | OROMUCOSAL | @ 19:00:00

## 2024-06-19 MED ADMIN — tacrolimus (PROGRAF) capsule 2 mg: 2 mg | ORAL | @ 01:00:00

## 2024-06-19 MED ADMIN — OLANZapine (ZYPREXA) tablet 5 mg: 5 mg | ORAL | @ 01:00:00

## 2024-06-19 MED ADMIN — lactated ringers bolus 1,000 mL: 1000 mL | INTRAVENOUS | @ 20:00:00 | Stop: 2024-06-19

## 2024-06-19 NOTE — Plan of Care (Signed)
 Pt day +7 from Allo SCT. Afebrile, other VSS on room air. Labs drawn, Mag replacement infusing per orders. PRN compazine  given for n/v. Pt free from falls/injuries this shift.     Shift Summary  Magnesium  sulfate was administered twice in response to low magnesium  levels.   CBC w/ Differential and metabolic panels were drawn, revealing several abnormal results including low WBC, low platelets, and low magnesium .   Infection prevention, neutropenic, and fall precautions were maintained throughout the shift.   No falls or new injuries were documented, and the environment was kept safe.   Overall, the shift was notable for ongoing safety and infection prevention measures, with interventions provided for abnormal lab findings.     Absence of Hospital-Acquired Illness or Injury: No new hospital-acquired injuries were documented during the shift, and safety interventions such as low bed position, locked bed wheels, and nonskid footwear were maintained; magnesium  sulfate was administered in response to low magnesium  levels, and neutropenic precautions were followed.     Absence of Infection Signs and Symptoms: Temperature remained stable and within a narrow range, and the sepsis risk score stayed low throughout the shift; infection prevention and isolation precautions were consistently implemented.     Absence of Fall and Fall-Related Injury: No falls or fall-related injuries occurred, and fall reduction strategies such as environmental modifications, low bed, and nonskid footwear were in place.

## 2024-06-19 NOTE — Progress Notes (Signed)
 BONE MARROW TRANSPLANT AND CELLULAR THERAPY PROGRESS NOTE    [name]  MRN: [id number]  Encounter Date: 06/19/24    Referring physician:  Dr. Toribio Bunker  BMT Attending MD: Dr. Jeaneen    Disease: Ph+B ALL  Current disease status: CR MRD Negative  Type of Transplant: RIC MUD Allo  Graft Source: Fresh PBSCs  Transplant Day: +7     Identifying Statement:  Patricia [name] is a 48 y.o. 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 [name] now presents for a matched unrelated donor stem cell transplant.      Interval History:  - [name]. Afebrile, VSS. Slept well.  - Continues with some nausea with an episode of nausea this morning.Continue sched Zofran  TID, Increase Zyprexa  to 10mg  HS for better control.  - Reports one episode of loose stool in the last 24h. Continue Imodium  PRN.   - PO intake 1 L. Will repeat 1L bolus this morning. Weight change: 0.544 kg (1 lb 3.2 oz)  - Fasting BG 150s this morning per Dexcom. Has been refusing POCT glucose checks. Sugars better controlled with prandial spikes up to 269. Lantus  increased to 20u, on nutritional and correctional SSI per Endocrinology.   - Noted some suprapubic pain this morning. UA, BK pending.     Review of Systems:  A full system review was performed and was negative except as noted in the above interval history.    Temp:  [36.9 ??C (98.4 ??F)-37.3 ??C (99.2 ??F)] 37.3 ??C (99.1 ??F)  Pulse:  [80-107] 97  Resp:  [16-18] 16  BP: (112-129)/(75-89) 118/84  MAP (mmHg):  [89-100] 94  SpO2:  [97 %-100 %] 97 %    I/O last 3 completed shifts:  In: 2586 [P.O.:1500; I.V.:1086]  Out: 3150 [Urine:3150]  I/O this shift:  In: 368 [P.O.:236; I.V.:132]  Out: -     Last 5 Recorded Weights    06/14/24 1945 06/15/24 2008 06/16/24 2037 06/17/24 2000   Weight: 77.7 kg (171 lb 6.4 oz) 77.2 kg (170 lb 3.1 oz) 77.3 kg (170 lb 6.7 oz) 77.9 kg (171 lb 11.2 oz)    06/18/24 2000   Weight: 78.4 kg (172 lb 14.4 oz)     Weight change: 0.544 kg (1 lb 3.2 oz)    Test Results:   Reviewed in EPIC. Abnormal values discussed below.     Scheduled Meds:Scheduled Medications[1]  Continuous Infusions:Infusions Meds[2]  PRN Meds:.PRN Medications[3]    Physical Exam: no change from 06/18/24  General : Sitting in bed. Appears in no acute distress.  Central venous access: Line clean, dry, intact. No erythema or drainage noted.   ENT: Moist mucous membranes. Oropharhynx without erythema or exudate. Lesion to her lower gums, behind incisors.  Cardiovascular: Pulse normal rate, regularity and rhythm. S1 and S2 normal, without any murmur, rub, or gallop.  Lungs: Clear to auscultation bilaterally, without wheezes/crackles/rhonchi. Good air movement.   Skin: Pale. Warm, dry, intact. No rash noted.   Psychiatry: Alert and oriented to person, place, and time.   Gastrointestinal: Normoactive bowel sounds, abdomen soft, non-tender   Musculoskeletal/Extremities: Full range of motion in shoulder, elbow, hip knee, ankle, wrists and feet. No edema.   Neurologic: CNII-XII intact. Normal strength and sensation throughout    Assessment/Plan:    BMT: ALL  CR MRD Negative  HCT-CI (age adjusted) 79 (age, anxiety, reduced DLCO, HF (reduced EF), T2DM, CMV viremia)  Conditioning:.  1. Fludarabine 40 mg/m2 days -5, -4, -3, -2  2. Melphalan  100  mg/m2 day -1      Donor: 10/10, Female, ABO O-, CMV negative  Engraftment: G-CSF starting Day + 5 through WBC recovery (as defined as ANC 1.0 x 2 days or 3.0 x 1 day)  -Date of last G-CSF injection: TBD     GVHD prophylaxis:   1.Tacrolimus  starting on day -3 (goal 5-10 ng/mL)  2. Methotrexate  5 mg/m2 IVP on days +1, +3, +6 and +11  3.  ATG per [facility] standard dosing will not be included     Hem: Transfusion criteria: Transfuse 1 unit of PRBCs for hemoglobin <7 and 1 unit of platelets for Plt <10K or bleeding. [name] [name] does not have a history of transfusion reactions. Consent was obtained and documented on 06/05/24.    ID:   Prophylaxis:  -Antiviral: Valtrex  500 mg po BID started on admission and continue for 2 year      Letermovir  indicated as R/CMV + D/CMV - and with prior hx of CMV viremia, plan to continue on admission through at least day +200  -Antifungal: Fluconazole  400 mg po daily to start day 0 thorugh day +75  -Antibacterial: Levaquin  500 mg po daily to start day 0, continue while neutropenic  -PJP: Sulfa allergic. Dapsone  daily upon platelet engraftment, has tolerated this well in the past. Toxo PCR negative.   -11/7 stopped Dapsone  upon admission, restart with platelet engraftment.      Patient was screened for upper respiratory symptoms, including S/S of Covid-19. bmt prescreen symptoms: Patient reports upper respiratory symptoms.  bmtu rpp testing: RPP to be ordered and performed ahead of BMTU admission. Patient educated on continued need to inform medical team of any new symptoms.     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   Previously required Valcyte  treatment  -Continue Letermovir  and Valtrex  BID through day +200 post SCT.      Recurrent Acute Sinusitis  Recurrent acute sinusitis with nasal congestion, frontal headache, and low-grade fever. Responded well to Augmentin .  -11/7 current sx resolving, started Augmentin  10 days (10/30-11/9)     Hypogammaglobulinemia  Monthly IVIG - last on 05/16/2024  - 11/7 check weekly IgG levels     GI:   GERD prophylaxis:  -Pepcid  BID     Nausea:  -Anti-emetics per BMT protocol.   - 06/14/24: Zyprexa  5 mg nightly  - 11/19: Zofran  sched TID, Increased Zyprexa  to 7.5mg  HS     Diarrhea:  -Antidiarrheals per BMT protocol    Renal: Creatinine normal. No issues.      FEN:    Electrolyte replacement per protocol  - 11/15 - 11/19: 1L NS bolus x1    Hepatic: Normal bilirubin. SOS prophylaxis not indicated with RIC regimen.   - 06/17/24: Rise in AST/ALT, monitor LFTs daily     CV: Followed by [facility] Cardiology: Heart failure with mildly reduced ejection fraction (HFmrEF)   Hx of EF 45-50%, most recent Echo 05/23/24 improved to 55-60%  Hx of prolonged QTc, last EKG 489 follow closely with addition of QTc prolonging medications  -11/7 QTc 457. Check weekly EKGs (next on 11/14), added cardiac lytes   -11/8: 40 meq of K  -11/9: 20 meq of K     Hypertension:    - Home med Losartan  50 mg daily (held 11/18)    FALL:  -11/14: Lightheadedness with a fall overnight 2am. (-) stat head CT w/o contrast. Neuro exam intact  -11/16: Feeling well, denies N/V, HA/lightheaded.  Pulm: No pulmonary symptoms currently. Reduced DLCO      Endocrine:   Menses suppression: Lupron  last 07/26/22. No menses      Type 2 DM exacerbated by steroids: Followed by [facility] Endocrinology. At home uses Dexcom for monitoring.  - 11/7 ACHS and SSI while inpatient. Lantus  25 units in AM. Can consider using Dexcom next week if numbers similar to hospital glucometer results   - 11/8: holding Lantus  due to morning POC 89  - 11/9: Blood glucose range 131 -189, resumed Lantus  at lower dose 10 Units  - 11/11: BG 200-400s in setting of steroids. Increased Lantus  to 15u.  - 11/12: BG 300s. Increased Lantus  to 18u.   - 11/16: BG range 288-363 last 24 hours, adjust SSI insulin  sensitivity factor  - 11/18: Appreciate endocrinology recs. Increased Lantus  20u. Placed on nutritional and correctional SSI.     Neuro/Pain:  - PRN tramadol  50 mg added. No oxy as this give patient migraines   - 11/8: Tramadol  was not helping, Morphine  with mild relief. Ordered dilaudid  0.5mg  q8 prn x 2 days for severe pain, removed tramadol  prn.  - 11/11: Tramadol  50mg  Q6H ordered PRN with goal to transition to oral medications for pain control.  - 11/12: Discontinued Dilaudid . Manage chronic hip/back pain with Tramadol  q6h PRN + heat therapy.     Psych: Followed by Palliative Care     Anxiety: Home med: Lexapro  10 mg daily   Reconnected with her local therapist   - If pt continues to endorse anxiety, may increase Lexapro  to 20 mg daily  - 11/7 added PRN ativan  0.5 mg Q8     Caregivers: Lives in [place], [place]  Primary caregiver: Jeanie Reich (mom)- first 30 days at [facility] then home if doing well will stay longer if medically needed  Secondary caregiver: Devaughn Ranger (husband)     Consults: While inpatient, consider consults to music therapy, integrative medicine and massage therapy.       Edson Deridder Jo-an Elva Levy, ACNP  [facility]               [1]    cetirizine   10 mg Oral Daily    escitalopram  oxalate  10 mg Oral Daily    famotidine   20 mg Oral BID    [START ON 06/24/2024] filgrastim   480 mcg Subcutaneous Q24H    fluconazole   400 mg Oral Daily    heparin , porcine (PF)  2 mL Intravenous Mon,Wed,Fri    insulin  glargine  20 Units Subcutaneous Daily    insulin  lispro  0-20 Units Subcutaneous 3xd Meals    insulin  lispro  0-20 Units Subcutaneous TID AC    letermovir   480 mg Oral Nightly    levoFLOXacin   500 mg Oral Daily    [Provider Hold] losartan   50 mg Oral Daily    [START ON 06/23/2024] methotrexate  (Preservative Free)  5 mg/m2 (Treatment Plan Adjusted) Intravenous Once    OLANZapine   5 mg Oral Nightly    ondansetron   8 mg Oral TID    tacrolimus  1.5 mg Oral Daily    tacrolimus  2 mg Oral Nightly    valACYclovir   500 mg Oral BID   [2]    IP okay to treat      sodium chloride  20 mL/hr (06/07/24 1326)   [3] albuterol , aluminum-magnesium  hydroxide-simethicone, CETAPHIL, dexAMETHasone , dextrose  in water, diphenhydrAMINE , emollient combination no.92, EPINEPHrine IM, famotidine  (PEPCID ) IV, glucagon , glucose, IP okay to treat, magic mouthwash oral **  AND** lidocaine , loperamide , loperamide , LORazepam , magnesium  sulfate, methylPREDNISolone  sodium succinate , phenol, potassium chloride  in water, prochlorperazine  **OR** prochlorperazine , sodium chloride  0.9%, traMADol 

## 2024-06-19 NOTE — Plan of Care (Addendum)
 Patient A&O X4, VSS, afebrile throughout shift. No acute events throughout shift. Plan of care reviewed with patient and applicable visitors, safety measures in place throughout shift. EKG completed. Patient endorses diarrhea, received PRN immodium x1.      Shift Summary  Insulin  lispro and glargine were administered in the morning for hyperglycemia, with subsequent doses of insulin  lispro given for elevated glucose and meals, but the evening dose was refused and the provider was notified.   PRN mouth pain interventions were provided in the afternoon with both magic mouthwash and lidocaine .   Lactated ringers  was administered in the afternoon.   Sepsis risk scores decreased in the afternoon, and urinalysis did not reveal significant findings related to infection.   Glucose levels decreased over the shift, and comfort was maintained with pain scores at 0 throughout.

## 2024-06-19 NOTE — Consults (Signed)
 Endocrine Team Diabetes Follow Up Consult Note     Consult information:  Requesting Attending Physician : Venetia Dasie Craft, MD  Service Requesting Consult : Bone Marrow (MDT)  Primary Care Provider: Estelle Toribio SAUNDERS, MD  Impression:  Patricia [name] is a 48 y.o. 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 She is post BMT. We have been consulted at the request of Venetia Dasie Craft, MD to evaluate Patricia [name] for hyperglycemia.     Medical Decision Making:  Diagnoses:  1.Type 2 Diabetes. Uncontrolled With glucoses at inpatient target over last 24 hours.   2. Nutrition: Complicating glycemic control. Increasing risk for both hypoglycemia and hyperglycemia.    Studies reviewed 06/19/24:  Labs: CBC, BMP, and POCT-BG  Interpretation: pancytopenia, mild hypernatremia, normal kidney function, hypomagnesemia, hypoalbuminemia, elevated liver enzymes,   Notes reviewed: Primary team and outpatient endocrinology notes      Overall impression based on above reviews and history:  Type 2 diabetes mellitus with hyperglycemia in setting of reduced dosage during her stem cell transplantation. Her insulin  has been titrated over the last several days with some improvement. She is expected to have a decrease in her oral intake over the next week due to GI side effects from BMT regimen and may need decrease of her basal insulin  at that time.   Levofloxacin  can be associated with glucose dysregulation as well (both hypo and hyperglycemia).    Last dose of dexamethasone  11/11.  Will continue on the same regimen today to assess nutritional coverage.     Recommendations:  - Basal insulin  Lantus  20 units daily (from 18 units daily)  - Nutritional insulin  Lispro TID, carb counting 1:8 (home regimen) administered prior to meals   - Correctional insulin  Lispro, ISF 20  - Point of care glucose testing achs. Patient preference to use her CGM (Dexcom 7) noted and we discussed that staff will still be attempting POC checks per hospital policy  - Hypoglycemia protocol  - Ensure patient is on glucose precautions if patient taking nutrition by mouth.   - May require decrease in her basal insulin  with the expected decrease in her oral intake in the coming week    Thank you for this consult. Discussed plan with primary team.     We will continue to follow and make recommendations. Endocrine consult team will place insulin  orders per primary team preference.     Please page with questions or concerns: Endocrine fellow on call: [phone]      Subjective:  Initial HPI:  Patricia [name] is a 48 y.o. 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 We have been consulted at the request of Venetia Dasie Craft, MD to evaluate Patricia [name] for hyperglycemia.     She reports frustration with the lack of control over her glucose checks and insulin  administration in the hospital. She has been doing carb counting at home with ratio 1:8 and would like to be able to receive nutritional insulin  prior to meals.     Receiving 10-18 units of Lispro daily on top of Lantus  18 units daily.   Fasting BG 238 on 11/18.   Normal renal function.     Diabetes History:  Patient has a history of Type 2 diabetes diagnosed 09/2023.  Diabetes is managed by: [facility] Endocrinology fellows clinic (Dr. 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    Scheduled Medications[1]    Current Outpatient Medications   Medication Instructions    albuterol  HFA 90 mcg/actuation inhaler 2 puffs, Inhalation, Every 6 hours PRN    aspirin  (ECOTRIN) 81 mg, Daily (standard)    blood sugar diagnostic (GLUCOSE BLOOD) Strp Use to check blood sugar two times a day.    blood-glucose meter kit Use to check blood sugar two times a day. Use as instructed.    blood-glucose sensor (DEXCOM G7 SENSOR) Devi 1 each, Miscellaneous, Every 10 days    cetirizine  (ZYRTEC ) 10 mg, Daily (standard)    dapsone  100 mg, Oral, Daily (standard), TAKE 1 TABLET(100 MG) BY MOUTH DAILY    escitalopram  oxalate (LEXAPRO ) 10 mg, Oral, Daily (standard)    insulin  aspart (NOVOLOG  FLEXPEN) 100 unit/mL (3 mL) injection pen Use up to 30 units/day, divided TID AC meals as per MD instructions    lancets Misc Use to check blood sugar as directed 2 times a day & for symptoms of high or low blood sugar.    LANTUS  SOLOSTAR U-100 INSULIN  25 Units, Subcutaneous, Daily (standard)    letermovir  (PREVYMIS ) 480 mg, Oral, At bedtime    LORazepam  (ATIVAN ) 0.5 mg, Oral, Every 8 hours PRN    losartan  (COZAAR ) 50 mg, Oral, Daily (standard)    valACYclovir  (VALTREX ) 500 mg, Oral, Daily (standard)     Past Medical History[2]    Past Surgical History[3]    Family History[4]    Short Social History[5]    OBJECTIVE:  BP 122/87  - Pulse 107  - Temp 37.3 ??C (99.2 ??F) (Oral)  - Resp 18  - Ht 168 cm (5' 6.14)  - Wt 78.4 kg (172 lb 14.4 oz)  - SpO2 98%  - BMI 27.79 kg/m??   Wt Readings from Last 12 Encounters:   06/18/24 78.4 kg (172 lb 14.4 oz)   06/05/24 79.7 kg (175 lb 11.3 oz)   05/29/24 78.8 kg (173 lb 11.6 oz)   05/29/24 79.8 kg (175 lb 14.8 oz)   05/23/24 78.7 kg (173 lb 6.4 oz)   05/17/24 79.6 kg (175 lb 9.5 oz)   05/16/24 79.9 kg (176 lb 2.4 oz)   05/09/24 80.4 kg (177 lb 4 oz)   05/02/24 81.4 kg (179 lb 5.5 oz)   04/25/24 81.7 kg (180 lb 0.1 oz)   04/18/24 81.9 kg (180 lb 9.6 oz)   04/16/24 82.7 kg (182 lb 6.4 oz)     Physical Exam  Constitutional:       General: She is not in acute distress. Appearance: Normal appearance.   HENT:      Mouth/Throat:      Mouth: Mucous membranes are moist.   Pulmonary:      Effort: Pulmonary effort is normal.   Neurological:      Mental Status: She is alert.         Glucose summary.   Glucose, POC (mg/dL)   Date Value   88/82/7974 238 (H)   06/16/2024 167  06/16/2024 324 (H)   06/16/2024 364 (H)   06/16/2024 183 (H)   06/15/2024 297 (H)   06/15/2024 358 (H)   06/15/2024 363 (H)        Summary of labs:  Lab Results   Component Value Date    A1C 5.0 03/22/2024    A1C 7.1 (H) 10/17/2023    A1C <3.8 (L) 11/15/2022     Lab Results   Component Value Date    CREATININE 0.69 06/18/2024     Lab Results   Component Value Date    WBC 0.1 (LL) 06/18/2024    HGB 8.5 (L) 06/18/2024    HCT 24.2 (L) 06/18/2024    PLT 23 (L) 06/18/2024       Lab Results   Component Value Date    NA 146 (H) 06/18/2024    K 4.3 06/18/2024    CL 111 (H) 06/18/2024    CO2 25.0 06/18/2024    BUN 14 06/18/2024    CREATININE 0.69 06/18/2024    GLU 148 06/18/2024    CALCIUM  8.9 06/18/2024    MG 1.5 (L) 06/18/2024    PHOS 3.8 06/16/2024       Lab Results   Component Value Date    BILITOT 0.2 (L) 06/18/2024    BILIDIR <0.10 06/18/2024    PROT 5.9 06/18/2024    ALBUMIN 3.2 (L) 06/18/2024    ALT 84 (H) 06/18/2024    AST 35 (H) 06/18/2024    ALKPHOS 62 06/18/2024    GGT 836 (H) 11/25/2023       Lab Results   Component Value Date    INR 1.05 06/16/2024    APTT 26.6 06/16/2024            [1]    cetirizine   10 mg Oral Daily    escitalopram  oxalate  10 mg Oral Daily    famotidine   20 mg Oral BID    [START ON 06/24/2024] filgrastim   480 mcg Subcutaneous Q24H    fluconazole   400 mg Oral Daily    heparin , porcine (PF)  2 mL Intravenous Mon,Wed,Fri    insulin  glargine  20 Units Subcutaneous Daily    insulin  lispro  0-20 Units Subcutaneous 3xd Meals    insulin  lispro  0-20 Units Subcutaneous TID AC    letermovir   480 mg Oral Nightly    levoFLOXacin   500 mg Oral Daily    losartan   50 mg Oral Daily    [START ON 06/23/2024] methotrexate  (Preservative Free)  5 mg/m2 (Treatment Plan Adjusted) Intravenous Once    OLANZapine   5 mg Oral Nightly    ondansetron   8 mg Oral TID    tacrolimus   1.5 mg Oral Daily    tacrolimus   2 mg Oral Nightly    valACYclovir   500 mg Oral BID   [2]   Past Medical History:  Diagnosis Date    Anxiety     B-cell acute lymphoblastic leukemia (ALL)    (CMS-HCC) 10/29/2021    Cytomegalovirus (CMV) viremia    (CMS-HCC)     Diabetes mellitus (CMS-HCC)     Hypertension    [3]   Past Surgical History:  Procedure Laterality Date    BREAST CYST EXCISION Right     age 49    CHEMOTHERAPY      leukemia    IR INSERT PORT AGE GREATER THAN 5 YRS  12/08/2021    IR INSERT PORT AGE GREATER THAN 5 YRS 12/08/2021 Charmaine Arloa Provencal, PA IMG VIR  [facility]   [4] History reviewed. No pertinent family history.  [5]   Social History  Tobacco Use    Smoking status: Former     Current packs/day: 0.00     Types: Cigarettes     Quit date: 11/12/2015     Years since quitting: 8.6    Smokeless tobacco: Never   Vaping Use    Vaping status: Former   Substance Use Topics    Alcohol use: Not Currently    Drug use: Not Currently

## 2024-06-20 LAB — PROTIME-INR
INR: 1.08
PROTIME: 12.3 s (ref 9.9–12.6)

## 2024-06-20 LAB — BASIC METABOLIC PANEL
ANION GAP: 14 mmol/L (ref 5–14)
BLOOD UREA NITROGEN: 11 mg/dL (ref 9–23)
BUN / CREAT RATIO: 20
CALCIUM: 8.9 mg/dL (ref 8.7–10.4)
CHLORIDE: 107 mmol/L (ref 98–107)
CO2: 25 mmol/L (ref 20.0–31.0)
CREATININE: 0.55 mg/dL (ref 0.55–1.02)
EGFR CKD-EPI (2021) FEMALE: >90 mL/min/1.73m2 (ref >=60)
GLUCOSE RANDOM: 142 mg/dL (ref 70–179)
POTASSIUM: 4.1 mmol/L (ref 3.4–4.8)
SODIUM: 146 mmol/L — ABNORMAL HIGH (ref 135–145)

## 2024-06-20 LAB — PHOSPHORUS: PHOSPHORUS: 4.5 mg/dL (ref 2.4–5.1)

## 2024-06-20 LAB — CBC W/ AUTO DIFF
BASOPHILS ABSOLUTE COUNT: 0 10*9/L (ref 0.0–0.1)
BASOPHILS RELATIVE PERCENT: 0 %
EOSINOPHILS ABSOLUTE COUNT: 0 10*9/L (ref 0.0–0.5)
EOSINOPHILS RELATIVE PERCENT: 5.9 %
HEMATOCRIT: 23.2 % — ABNORMAL LOW (ref 34.0–44.0)
HEMOGLOBIN: 8.2 g/dL — ABNORMAL LOW (ref 11.3–14.9)
LYMPHOCYTES ABSOLUTE COUNT: 0.1 10*9/L — ABNORMAL LOW (ref 1.1–3.6)
LYMPHOCYTES RELATIVE PERCENT: 77.2 %
MEAN CORPUSCULAR HEMOGLOBIN CONC: 35.4 g/dL (ref 32.0–36.0)
MEAN CORPUSCULAR HEMOGLOBIN: 32.3 pg (ref 25.9–32.4)
MEAN CORPUSCULAR VOLUME: 91.3 fL (ref 77.6–95.7)
MEAN PLATELET VOLUME: 8.6 fL (ref 6.8–10.7)
MONOCYTES ABSOLUTE COUNT: 0 10*9/L — ABNORMAL LOW (ref 0.3–0.8)
MONOCYTES RELATIVE PERCENT: 11 %
NEUTROPHILS ABSOLUTE COUNT: 0 10*9/L — CL (ref 1.8–7.8)
NEUTROPHILS RELATIVE PERCENT: 5.9 %
PLATELET COUNT: 16 10*9/L — ABNORMAL LOW (ref 150–450)
RED BLOOD CELL COUNT: 2.54 10*12/L — ABNORMAL LOW (ref 3.95–5.13)
RED CELL DISTRIBUTION WIDTH: 13.9 % (ref 12.2–15.2)
WBC ADJUSTED: 0.1 10*9/L — CL (ref 3.6–11.2)

## 2024-06-20 LAB — HEPATIC FUNCTION PANEL
ALBUMIN: 3.2 g/dL — ABNORMAL LOW (ref 3.4–5.0)
ALKALINE PHOSPHATASE: 57 U/L (ref 46–116)
ALT (SGPT): 64 U/L — ABNORMAL HIGH (ref 10–49)
AST (SGOT): 25 U/L (ref <=34)
BILIRUBIN DIRECT: 0.1 mg/dL (ref 0.00–0.30)
BILIRUBIN TOTAL: 0.3 mg/dL (ref 0.3–1.2)
PROTEIN TOTAL: 5.8 g/dL (ref 5.7–8.2)

## 2024-06-20 LAB — TACROLIMUS LEVEL, TROUGH: TACROLIMUS, TROUGH: 6.5 ng/mL (ref 5.0–15.0)

## 2024-06-20 LAB — BK VIRUS QUANTITATIVE PCR, URINE
BK URINE QUANT: <200 [IU]/mL — ABNORMAL HIGH (ref <=0)
BK URINE RESULT: DETECTED — AB

## 2024-06-20 LAB — LACTATE DEHYDROGENASE: LACTATE DEHYDROGENASE: 170 U/L (ref 120–246)

## 2024-06-20 LAB — APTT
APTT: 29.6 s (ref 24.8–38.4)
HEPARIN CORRELATION: 0.2

## 2024-06-20 LAB — IONIZED CALCIUM VENOUS: CALCIUM IONIZED VENOUS (MG/DL): 4.87 mg/dL (ref 4.40–5.40)

## 2024-06-20 LAB — MAGNESIUM: MAGNESIUM: 1.6 mg/dL (ref 1.6–2.6)

## 2024-06-20 MED ADMIN — famotidine (PEPCID) tablet 20 mg: 20 mg | ORAL | @ 01:00:00

## 2024-06-20 MED ADMIN — famotidine (PEPCID) tablet 20 mg: 20 mg | ORAL | @ 14:00:00

## 2024-06-20 MED ADMIN — escitalopram oxalate (LEXAPRO) tablet 10 mg: 10 mg | ORAL | @ 14:00:00

## 2024-06-20 MED ADMIN — fluconazole (DIFLUCAN) tablet 400 mg: 400 mg | ORAL | @ 14:00:00 | Stop: 2024-07-12

## 2024-06-20 MED ADMIN — letermovir (PREVYMIS) tablet 480 mg: 480 mg | ORAL | @ 01:00:00

## 2024-06-20 MED ADMIN — magnesium sulfate 2gm/50mL IVPB: 2 g | INTRAVENOUS | @ 11:00:00

## 2024-06-20 MED ADMIN — magnesium sulfate 2gm/50mL IVPB: 2 g | INTRAVENOUS | @ 09:00:00

## 2024-06-20 MED ADMIN — prochlorperazine (COMPAZINE) injection 10 mg: 10 mg | INTRAVENOUS | @ 02:00:00

## 2024-06-20 MED ADMIN — cetirizine (ZYRTEC) tablet 10 mg: 10 mg | ORAL | @ 14:00:00

## 2024-06-20 MED ADMIN — valACYclovir (VALTREX) tablet 500 mg: 500 mg | ORAL | @ 14:00:00

## 2024-06-20 MED ADMIN — valACYclovir (VALTREX) tablet 500 mg: 500 mg | ORAL | @ 01:00:00

## 2024-06-20 MED ADMIN — insulin lispro (HumaLOG) injection CORRECTIONAL 0-20 Units: 0-20 [IU] | SUBCUTANEOUS | @ 21:00:00

## 2024-06-20 MED ADMIN — insulin lispro (HumaLOG) injection CORRECTIONAL 0-20 Units: 0-20 [IU] | SUBCUTANEOUS | @ 17:00:00

## 2024-06-20 MED ADMIN — tacrolimus (PROGRAF) 1.5mg combo product: 1.5 mg | ORAL | @ 14:00:00

## 2024-06-20 MED ADMIN — levoFLOXacin (LEVAQUIN) tablet 500 mg: 500 mg | ORAL | @ 14:00:00 | Stop: 2024-07-12

## 2024-06-20 MED ADMIN — insulin glargine (LANTUS) injection BASAL 18 Units: 18 [IU] | SUBCUTANEOUS | @ 16:00:00

## 2024-06-20 MED ADMIN — OLANZapine (ZYPREXA) tablet 7.5 mg: 7.5 mg | ORAL | @ 01:00:00

## 2024-06-20 MED ADMIN — ondansetron (ZOFRAN) tablet 8 mg: 8 mg | ORAL | @ 17:00:00

## 2024-06-20 MED ADMIN — ondansetron (ZOFRAN) tablet 8 mg: 8 mg | ORAL | @ 23:00:00

## 2024-06-20 MED ADMIN — ondansetron (ZOFRAN) tablet 8 mg: 8 mg | ORAL | @ 11:00:00

## 2024-06-20 MED ADMIN — tacrolimus (PROGRAF) capsule 2 mg: 2 mg | ORAL | @ 01:00:00

## 2024-06-20 MED ADMIN — lactated ringers bolus 1,000 mL: 1000 mL | INTRAVENOUS | @ 16:00:00 | Stop: 2024-06-20

## 2024-06-20 NOTE — Plan of Care (Signed)
 Pt day +8 from Allo SCT. Afebrile, other VSS on room air. PRN IV compazine  given once for n/v. Labs drawn, Mag replacement infusing per orders. No blood products needed. Pt free from falls/injuries this shift.     Shift Summary  CBC w/ Differential at 12:21 AM revealed critical neutropenia, anemia, and thrombocytopenia, prompting continued neutropenic and isolation precautions.  Magnesium  level was found to be low and magnesium  sulfate was administered at 4:13 AM.  Safety interventions and fall precautions were maintained throughout the shift, with no reported falls or injuries.  Infection prevention and management protocols were followed, including environmental surveillance and protective precautions.  Overall, the environment remained safe and no new hospital-acquired injuries or illnesses were documented during the shift.    Absence of Hospital-Acquired Illness or Injury: No new hospital-acquired injuries or illnesses were documented during the shift; skin assessments noted bruising but no progression or new abnormalities, and safety interventions such as bed in lowest position, side rails, and nonskid footwear were consistently maintained.    Absence of Infection Signs and Symptoms: Temperature remained stable and within normal range throughout the shift, and infection prevention and management protocols were followed; however, sepsis risk score increased in the early morning hours and lab results showed significant neutropenia and low WBC, with neutropenic and isolation precautions maintained.    Absence of Fall and Fall-Related Injury: Fall risk interventions were consistently implemented, including hourly visual checks, bed in lowest position, bed wheels locked, and call light within reach, with no falls or injuries reported during the shift.

## 2024-06-20 NOTE — Plan of Care (Signed)
 Day +8 RIC FLU MEL Allo SCT.  Pancytopenic. Maintained bleeding, protective and falls precautions.  Initiated Danger Zone. Patient not orthostatic but states easily fatigued with activity..  Pt showered but declined CHG wipes.  Reviewed importance of CHG treatments in infection prevention especially when neutropenic.  Denies N/V/D today. Continue ATC anti emetics.  Oral mucosa pale, with shallow lesions forming at lower gumline. Encouraged meticulous oral care, provided oral sponges.   Accuchecks monitored ACHS using pts CGM.   Morning low of 85, pt drank a protein shake with recovery to 105.  Did not feel symptomatic.  Insulin  provided per SSI orders, no meal insulin  given due to low carb counts per patient, lantus  dose decreased.   Problem: Stem Cell/Bone Marrow Transplant  Goal: Improved Activity Tolerance  Outcome: Shift Focus  Goal: Blood Counts Within Acceptable Range  Outcome: Shift Focus  Intervention: Monitor and Manage Hematologic Symptoms  Recent Flowsheet Documentation  Taken 06/20/2024 0816 by Melanie Burnard HERO, RN  Bleeding Precautions: monitored for signs of bleeding  Goal: Improved Oral Mucous Membrane Health and Integrity  Outcome: Shift Focus  Intervention: Promote Oral Comfort and Health  Recent Flowsheet Documentation  Taken 06/20/2024 0941 by Melanie Burnard HERO, RN  Oral Mucous Membrane Protection:   lip/mouth moisturizer applied   nonirritating oral fluids promoted   nonirritating oral foods promoted  Goal: Nausea and Vomiting Symptom Relief  Outcome: Shift Focus

## 2024-06-20 NOTE — Progress Notes (Signed)
 BONE MARROW TRANSPLANT AND CELLULAR THERAPY PROGRESS NOTE    [name]  MRN: [id number]  Encounter Date: 06/20/24    Referring physician:  Dr. Toribio Bunker  BMT Attending MD: Dr. Jeaneen    Disease: Ph+B ALL  Current disease status: CR MRD Negative  Type of Transplant: RIC MUD Allo  Graft Source: Fresh PBSCs  Transplant Day: +8     Identifying Statement:  Patricia [name] is a 48 y.o. 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 Patricia [name] now presents for a matched unrelated donor stem cell transplant.      Interval History:  - [name]. Afebrile, VSS. Slept well.  - Continues with nausea. Had an episode of emesis last night. Continue sched Zofran  TID, Zyprexa  7.5mg  HS.  - Continues with some loose stools in the last 24h. Continue Imodium  PRN.   - PO intake 1.6 L. Will repeat 1L bolus today. Weight change: -0.68 kg (-1 lb 8 oz)  - Fasting BG 80s this morning per Dexcom. With decrease in oral intake, will decrease Lantus  to 18u.   - Suprapubic pain improved. UA unrevealing, BK pending.     Review of Systems:  A full system review was performed and was negative except as noted in the above interval history.    Temp:  [36.7 ??C (98.1 ??F)-37 ??C (98.6 ??F)] 36.7 ??C (98.1 ??F)  Pulse:  [80-110] 109  Resp:  [16-18] 16  BP: (104-137)/(75-94) 113/77  MAP (mmHg):  [85-107] 88  SpO2:  [97 %-98 %] 98 %    I/O last 3 completed shifts:  In: 4025 [P.O.:1569; I.V.:2456]  Out: 4550 [Urine:4550]  No intake/output data recorded.    Last 5 Recorded Weights    06/15/24 2008 06/16/24 2037 06/17/24 2000 06/18/24 2000   Weight: 77.2 kg (170 lb 3.1 oz) 77.3 kg (170 lb 6.7 oz) 77.9 kg (171 lb 11.2 oz) 78.4 kg (172 lb 14.4 oz)    06/19/24 2059   Weight: 77.7 kg (171 lb 6.4 oz)     Weight change: -0.68 kg (-1 lb 8 oz)    Test Results:   Reviewed in EPIC. Abnormal values discussed below.     Scheduled Meds:Scheduled Medications[1]  Continuous Infusions:Infusions Meds[2]  PRN Meds:.PRN Medications[3]    Physical Exam: no change from 06/19/24  General : Sitting in bed. Appears in no acute distress.  Central venous access: Line clean, dry, intact. No erythema or drainage noted.   ENT: Moist mucous membranes. Oropharhynx without erythema or exudate. Lesion to her lower gums, behind incisors.  Cardiovascular: Pulse normal rate, regularity and rhythm. S1 and S2 normal, without any murmur, rub, or gallop.  Lungs: Clear to auscultation bilaterally, without wheezes/crackles/rhonchi. Good air movement.   Skin: Pale. Warm, dry, intact. No rash noted.   Psychiatry: Alert and oriented to person, place, and time.   Gastrointestinal: Normoactive bowel sounds, abdomen soft, non-tender   Musculoskeletal/Extremities: Full range of motion in shoulder, elbow, hip knee, ankle, wrists and feet. No edema.   Neurologic: CNII-XII intact. Normal strength and sensation throughout    Assessment/Plan:    BMT: ALL  CR MRD Negative  HCT-CI (age adjusted) 36 (age, anxiety, reduced DLCO, HF (reduced EF), T2DM, CMV viremia)  Conditioning:.  1. Fludarabine  40 mg/m2 days -5, -4, -3, -2  2. Melphalan  100  mg/m2 day -1      Donor: 10/10, Female, ABO O-, CMV negative  Engraftment: G-CSF starting Day + 5 through WBC recovery (as defined as ANC 1.0 x  2 days or 3.0 x 1 day)  -Date of last G-CSF injection: TBD     GVHD prophylaxis:   1.Tacrolimus  starting on day -3 (goal 5-10 ng/mL)  2. Methotrexate  5 mg/m2 IVP on days +1, +3, +6 and +11  3.  ATG per [facility] standard dosing will not be included     Hem: Transfusion criteria: Transfuse 1 unit of PRBCs for hemoglobin <7 and 1 unit of platelets for Plt <10K or bleeding. Patricia [name]. Patricia [name] does not have a history of transfusion reactions. Consent was obtained and documented on 06/05/24.    ID:   Prophylaxis:  -Antiviral: Valtrex  500 mg po BID started on admission and continue for 2 year      Letermovir  indicated as R/CMV + D/CMV - and with prior hx of CMV viremia, plan to continue on admission through at least day +200  -Antifungal: Fluconazole  400 mg po daily to start day 0 thorugh day +75  -Antibacterial: Levaquin  500 mg po daily to start day 0, continue while neutropenic  -PJP: Sulfa allergic. Dapsone  daily upon platelet engraftment, has tolerated this well in the past. Toxo PCR negative.   -11/7 stopped Dapsone  upon admission, restart with platelet engraftment.      Patient was screened for upper respiratory symptoms, including S/S of Covid-19. bmt prescreen symptoms: Patient reports upper respiratory symptoms.  bmtu rpp testing: RPP to be ordered and performed ahead of BMTU admission. Patient educated on continued need to inform medical team of any new symptoms.     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   Previously required Valcyte  treatment  -Continue Letermovir  and Valtrex  BID through day +200 post SCT.      Recurrent Acute Sinusitis  Recurrent acute sinusitis with nasal congestion, frontal headache, and low-grade fever. Responded well to Augmentin .  -11/7 current sx resolving, started Augmentin  10 days (10/30-11/9)     Hypogammaglobulinemia  Monthly IVIG - last on 05/16/2024  - 11/7 check weekly IgG levels     GI:   GERD prophylaxis:  -Pepcid  BID     Nausea:  -Anti-emetics per BMT protocol.   - 06/14/24: Zyprexa  5 mg nightly  - 11/19: Zofran  sched TID, Increased Zyprexa  to 7.5mg  HS     Diarrhea:  -Antidiarrheals per BMT protocol    Renal: Creatinine normal. No issues.      FEN:    Electrolyte replacement per protocol  - 11/15 - 11/19: 1L NS bolus x1    Hepatic: Normal bilirubin. SOS prophylaxis not indicated with RIC regimen.   - 06/17/24: Rise in AST/ALT, monitor LFTs daily     CV: Followed by [facility]iology: Heart failure with mildly reduced ejection fraction (HFmrEF)   Hx of EF 45-50%, most recent Echo 05/23/24 improved to 55-60%  Hx of prolonged QTc, last EKG 489 follow closely with addition of QTc prolonging medications  -11/7 QTc 457. Check weekly EKGs (next on 11/14), added cardiac lytes   -11/8: 40 meq of K  -11/9: 20 meq of K     Hypertension:    - Home med Losartan  50 mg daily (held 11/18)    Vasovagal Episode:  -11/14: Lightheadedness with a fall overnight 2am. (-) stat head CT w/o contrast. Neuro exam intact  -11/16: Feeling well, denies N/V, HA/lightheaded.    Pulm: No pulmonary symptoms currently. Reduced DLCO      Endocrine:   Menses suppression: Lupron  last 07/26/22. No menses      Type 2 DM exacerbated  by steroids: Followed by [facility] End[facility]ology. At home uses Dexcom for monitoring.  - 11/7 ACHS and SSI while inpatient. Lantus  25 units in AM. Can consider using Dexcom next week if numbers similar to hospital glucometer results   - 11/8: holding Lantus  due to morning POC 89  - 11/9: Blood glucose range 131 -189, resumed Lantus  at lower dose 10 Units  - 11/11: BG 200-400s in setting of steroids. Increased Lantus  to 15u.  - 11/12: BG 300s. Increased Lantus  to 18u.   - 11/16: BG range 288-363 last 24 hours, adjust SSI insulin  sensitivity factor  - 11/18: Appreciate endocrinology recs. Increased Lantus  20u. Placed on nutritional and correctional SSI.   - 11/20: Poor PO intake, decrease Lantus  to 18u.    Neuro/Pain:  - PRN tramadol  50 mg added. No oxy as this give patient migraines   - 11/8: Tramadol  was not helping, Morphine  with mild relief. Ordered dilaudid  0.5mg  q8 prn x 2 days for severe pain, removed tramadol  prn.  - 11/11: Tramadol  50mg  Q6H ordered PRN with goal to transition to oral medications for pain control.  - 11/12: Discontinued Dilaudid . Manage chronic hip/back pain with Tramadol  q6h PRN + heat therapy.     Psych: Followed by Palliative Care     Anxiety: Home med: Lexapro  10 mg daily   Reconnected with her local therapist   - If pt continues to endorse anxiety, may increase Lexapro  to 20 mg daily  - 11/7 added PRN ativan  0.5 mg Q8     Caregivers: Lives in [place], [place]  Primary caregiver: Jeanie Brindley (mom)- first 30 days at [facility] then home if doing well will stay longer if medically needed  Secondary caregiver: Devaughn Ranger (husband)     Consults: While inpatient, consider consults to music therapy, integrative medicine and massage therapy.       Domonick Sittner Jo-an Elva Levy, ACNP  [facility]               [1]    cetirizine   10 mg Oral Daily    escitalopram  oxalate  10 mg Oral Daily    famotidine   20 mg Oral BID    [START ON 06/24/2024] filgrastim   480 mcg Subcutaneous Q24H    fluconazole   400 mg Oral Daily    heparin , porcine (PF)  2 mL Intravenous Mon,Wed,Fri    insulin  glargine  18 Units Subcutaneous Daily    insulin  lispro  0-20 Units Subcutaneous 3xd Meals    insulin  lispro  0-20 Units Subcutaneous TID AC    lactated ringers   1,000 mL Intravenous Once    letermovir   480 mg Oral Nightly    levoFLOXacin   500 mg Oral Daily    [Provider Hold] losartan   50 mg Oral Daily    [START ON 06/23/2024] methotrexate  (Preservative Free)  5 mg/m2 (Treatment Plan Adjusted) Intravenous Once    OLANZapine   7.5 mg Oral Nightly    ondansetron   8 mg Oral TID    tacrolimus   1.5 mg Oral Daily    tacrolimus   2 mg Oral Nightly    valACYclovir   500 mg Oral BID   [2]    IP okay to treat      sodium chloride  20 mL/hr (06/07/24 1326)   [3] albuterol , aluminum-magnesium  hydroxide-simethicone, CETAPHIL, dexAMETHasone , dextrose  in water, diphenhydrAMINE , emollient combination no.92, EPINEPHrine  IM, famotidine  (PEPCID ) IV, glucagon , glucose, IP okay to treat, magic mouthwash oral **AND** lidocaine , loperamide , loperamide , LORazepam , magnesium  sulfate, methylPREDNISolone  sodium succinate , phenol,  potassium chloride  in water, prochlorperazine  **OR** prochlorperazine , sodium chloride  0.9%, traMADol 

## 2024-06-20 NOTE — Consults (Signed)
 Tacrolimus  Therapeutic Monitoring Pharmacy Note    Patricia [name] is a 48 y.o. 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     Date of Transplant: 06/12/24      Prior Dosing Information: see table below     Source(s) of information used to determine prior to admission dosing: MAR    Goals:  Therapeutic Drug Levels  Tacrolimus  trough goal: 5-10 ng/mL    Additional Clinical Monitoring/Outcomes  Monitor renal function (SCr and urine output) and liver function (LFTs)  Monitor for signs/symptoms of adverse events (e.g., hyperglycemia, hyperkalemia, hypomagnesemia, hypertension, headache, tremor)    Previous Lab Results:  Tacrolimus , Trough   Date Value Ref Range Status   06/20/2024 6.5 5.0 - 15.0 ng/mL Final   06/18/2024 6.8 5.0 - 15.0 ng/mL Final   06/16/2024 3.1 (L) 5.0 - 15.0 ng/mL Final   06/14/2024 9.4 5.0 - 15.0 ng/mL Final   06/12/2024 10.7 5.0 - 15.0 ng/mL Final     Creatinine   Date Value Ref Range Status   06/19/2024 0.55 0.55 - 1.02 mg/dL Final   88/81/7974 9.30 0.55 - 1.02 mg/dL Final   88/82/7974 9.51 (L) 0.55 - 1.02 mg/dL Final      Result:  Tacrolimus  level from today was drawn appropriately     Pharmacokinetic Considerations and Significant Drug Interactions:  Concurrent CYP3A4 substrates/inhibitors: fluconazole  (moderate inhibitor, started 11/12), letermovir  (moderate inhibitor, initiated 05/2023)    Assessment/Plan:  Recommendation(s)  Trough is now therapeutic at 6.8 today and appropriately increased following dose increase  Fluconazole  recently started on 11/12 for fungal PPX. As fluconazole  reaches steady state, will continue to affect tacrolimus  levels. Letermovir  has been at steady state and should not have a continued affect on tacrolimus  levels  SCr stable/WNL  Bili/LFTs from 11/20 notable for elevated ALT however downtrending (AST now WNL)  Given the above, plan to continue tacrolimus  1.5 mg qAM and 2 mg qPM.    Follow-up  Next level has been ordered on 11/22 at 0800.   A pharmacist will continue to monitor and recommend levels as appropriate    Longitudinal Dose Monitoring:  Date Dose (mg), Route AM Scr (mg/dL) Level  (ng/mL) Key Drug Interactions   11/9 3.5/3.5 0.51  Letermovir    11/10 3.5/3.5 0.44  Letermovir    11/11 2.5/--- 0.51 16.1 Letermovir    11/12 ---/--- 0.53 10.7 Letermovir , Fluconazole  started   11/13 2/2 0.52 -- Letermovir , Fluconazole    11/14 2/-- 0.55 9.4 Letermovir , Fluconazole    11/15 1.5/1.5 0.48  Letermovir , Fluconazole    11/16 1.5/2 0.55 3.1 Letermovir , Fluconazole     11/17 1.5/2 0.45  Letermovir , Fluconazole     11/18 1.5/2 0.48 6.8 Letermovir , Fluconazole     11/19 1.5/2 0.69  Letermovir , Fluconazole     11/20 1.5/2 0.55 6.5 Letermovir , Fluconazole       Please page service pharmacist with questions/clarifications.    [name], PharmD, BCPS, BCOP, CPP  Bone Marrow Transplant and Cellular Therapy

## 2024-06-21 LAB — CBC W/ AUTO DIFF
BASOPHILS ABSOLUTE COUNT: 0 10*9/L (ref 0.0–0.1)
BASOPHILS RELATIVE PERCENT: 0 %
EOSINOPHILS ABSOLUTE COUNT: 0 10*9/L (ref 0.0–0.5)
EOSINOPHILS RELATIVE PERCENT: 4.8 %
HEMATOCRIT: 23.9 % — ABNORMAL LOW (ref 34.0–44.0)
HEMOGLOBIN: 8.4 g/dL — ABNORMAL LOW (ref 11.3–14.9)
LYMPHOCYTES ABSOLUTE COUNT: 0.1 10*9/L — ABNORMAL LOW (ref 1.1–3.6)
LYMPHOCYTES RELATIVE PERCENT: 81.1 %
MEAN CORPUSCULAR HEMOGLOBIN CONC: 35.3 g/dL (ref 32.0–36.0)
MEAN CORPUSCULAR HEMOGLOBIN: 32.4 pg (ref 25.9–32.4)
MEAN CORPUSCULAR VOLUME: 91.6 fL (ref 77.6–95.7)
MONOCYTES ABSOLUTE COUNT: 0 10*9/L — ABNORMAL LOW (ref 0.3–0.8)
MONOCYTES RELATIVE PERCENT: 10.1 %
NEUTROPHILS ABSOLUTE COUNT: 0 10*9/L — CL (ref 1.8–7.8)
NEUTROPHILS RELATIVE PERCENT: 4 %
PLATELET COUNT: >8 10*9/L — CL (ref 150–450)
RED BLOOD CELL COUNT: 2.61 10*12/L — ABNORMAL LOW (ref 3.95–5.13)
RED CELL DISTRIBUTION WIDTH: 13.6 % (ref 12.2–15.2)
WBC ADJUSTED: 0.1 10*9/L — CL (ref 3.6–11.2)

## 2024-06-21 LAB — HEPATIC FUNCTION PANEL
ALBUMIN: 3.3 g/dL — ABNORMAL LOW (ref 3.4–5.0)
ALKALINE PHOSPHATASE: 59 U/L (ref 46–116)
ALT (SGPT): 55 U/L — ABNORMAL HIGH (ref 10–49)
AST (SGOT): 24 U/L (ref <=34)
BILIRUBIN DIRECT: <0.1 mg/dL (ref 0.00–0.30)
BILIRUBIN TOTAL: 0.2 mg/dL — ABNORMAL LOW (ref 0.3–1.2)
PROTEIN TOTAL: 5.7 g/dL (ref 5.7–8.2)

## 2024-06-21 LAB — MAGNESIUM: MAGNESIUM: 1.5 mg/dL — ABNORMAL LOW (ref 1.6–2.6)

## 2024-06-21 LAB — PLATELET COUNT: PLATELET COUNT: 47 10*9/L — ABNORMAL LOW (ref 150–450)

## 2024-06-21 LAB — BASIC METABOLIC PANEL
ANION GAP: 11 mmol/L (ref 5–14)
BLOOD UREA NITROGEN: 10 mg/dL (ref 9–23)
BUN / CREAT RATIO: 18
CALCIUM: 8.7 mg/dL (ref 8.7–10.4)
CHLORIDE: 109 mmol/L — ABNORMAL HIGH (ref 98–107)
CO2: 23 mmol/L (ref 20.0–31.0)
CREATININE: 0.56 mg/dL (ref 0.55–1.02)
EGFR CKD-EPI (2021) FEMALE: >90 mL/min/1.73m2 (ref >=60)
GLUCOSE RANDOM: 184 mg/dL — ABNORMAL HIGH (ref 70–179)
POTASSIUM: 4.1 mmol/L (ref 3.4–4.8)
SODIUM: 143 mmol/L (ref 135–145)

## 2024-06-21 LAB — GLUCOSE

## 2024-06-21 MED ADMIN — famotidine (PEPCID) tablet 20 mg: 20 mg | ORAL | @ 01:00:00

## 2024-06-21 MED ADMIN — famotidine (PEPCID) tablet 20 mg: 20 mg | ORAL | @ 14:00:00

## 2024-06-21 MED ADMIN — escitalopram oxalate (LEXAPRO) tablet 10 mg: 10 mg | ORAL | @ 14:00:00

## 2024-06-21 MED ADMIN — insulin lispro (HumaLOG) injection 0-20 Units: 0-20 [IU] | SUBCUTANEOUS | @ 19:00:00

## 2024-06-21 MED ADMIN — insulin lispro (HumaLOG) injection 0-20 Units: 0-20 [IU] | SUBCUTANEOUS | @ 14:00:00

## 2024-06-21 MED ADMIN — fluconazole (DIFLUCAN) tablet 400 mg: 400 mg | ORAL | @ 14:00:00 | Stop: 2024-07-12

## 2024-06-21 MED ADMIN — letermovir (PREVYMIS) tablet 480 mg: 480 mg | ORAL | @ 01:00:00

## 2024-06-21 MED ADMIN — heparin, porcine (PF) 100 unit/mL injection 2 mL: 2 mL | INTRAVENOUS | @ 04:00:00

## 2024-06-21 MED ADMIN — magnesium sulfate 2gm/50mL IVPB: 2 g | INTRAVENOUS | @ 12:00:00

## 2024-06-21 MED ADMIN — magnesium sulfate 2gm/50mL IVPB: 2 g | INTRAVENOUS | @ 09:00:00

## 2024-06-21 MED ADMIN — prochlorperazine (COMPAZINE) injection 10 mg: 10 mg | INTRAVENOUS | @ 12:00:00

## 2024-06-21 MED ADMIN — cetirizine (ZYRTEC) tablet 10 mg: 10 mg | ORAL | @ 14:00:00

## 2024-06-21 MED ADMIN — valACYclovir (VALTREX) tablet 500 mg: 500 mg | ORAL | @ 01:00:00

## 2024-06-21 MED ADMIN — valACYclovir (VALTREX) tablet 500 mg: 500 mg | ORAL | @ 14:00:00

## 2024-06-21 MED ADMIN — tacrolimus (PROGRAF) 1.5mg combo product: 1.5 mg | ORAL | @ 14:00:00

## 2024-06-21 MED ADMIN — levoFLOXacin (LEVAQUIN) tablet 500 mg: 500 mg | ORAL | @ 14:00:00 | Stop: 2024-07-12

## 2024-06-21 MED ADMIN — insulin glargine (LANTUS) injection BASAL 18 Units: 18 [IU] | SUBCUTANEOUS | @ 14:00:00

## 2024-06-21 MED ADMIN — OLANZapine (ZYPREXA) tablet 7.5 mg: 7.5 mg | ORAL | @ 01:00:00

## 2024-06-21 MED ADMIN — ondansetron (ZOFRAN) tablet 8 mg: 8 mg | ORAL | @ 17:00:00

## 2024-06-21 MED ADMIN — ondansetron (ZOFRAN) tablet 8 mg: 8 mg | ORAL | @ 12:00:00

## 2024-06-21 MED ADMIN — ondansetron (ZOFRAN) tablet 8 mg: 8 mg | ORAL | @ 23:00:00

## 2024-06-21 MED ADMIN — tacrolimus (PROGRAF) capsule 2 mg: 2 mg | ORAL | @ 01:00:00

## 2024-06-21 MED ADMIN — lactated ringers bolus 1,000 mL: 1000 mL | INTRAVENOUS | @ 15:00:00 | Stop: 2024-06-21

## 2024-06-21 NOTE — Consults (Signed)
 Endocrine Team Diabetes Follow Up Consult Note     Consult information:  Requesting Attending Physician : Patricia Dasie Craft, MD  Service Requesting Consult : Bone Marrow (MDT)  Primary Care Provider: Estelle Toribio SAUNDERS, MD  Impression:  Patricia [name] is a 48 y.o. 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 She is post BMT. We have been consulted at the request of Patricia Dasie Craft, MD to evaluate Patricia [name] for hyperglycemia.     Medical Decision Making:  Diagnoses:  1.Type 2 Diabetes. Uncontrolled With glucoses at inpatient target over last 24 hours.   2. Nutrition: Complicating glycemic control. Increasing risk for both hypoglycemia and hyperglycemia.    Studies reviewed 06/21/24:  Labs: CBC, BMP, and POCT-BG  Interpretation: pancytopenia, normal kidney function, hypomagnesemia, hypoalbuminemia, elevated liver enzymes, normal BG for inpatient  Notes reviewed: Primary team and outpatient endocrinology notes      Overall impression based on above reviews and history:  Type 2 diabetes mellitus with hyperglycemia in setting of reduced dosage during her stem cell transplantation. Her insulin  has been titrated over the last several days with some improvement. She is expected to have a decrease in her oral intake over the next week due to GI side effects from BMT regimen and may need decrease of her basal insulin  at that time.   Levofloxacin  can be associated with glucose dysregulation as well (both hypo and hyperglycemia).    Last dose of dexamethasone  11/11.  Overall, BG at goal, will continue on the same regimen today.     Recommendations:  - Basal insulin  Lantus  18 units daily  - Nutritional insulin  Lispro TID, carb counting 1:8 (home regimen) administered prior to meals   - Correctional insulin  Lispro, ISF 20  - Point of care glucose testing achs. Patient preference to use her CGM (Dexcom 7) noted and we discussed that staff will still be attempting POC checks per hospital policy  - Hypoglycemia protocol  - Ensure patient is on glucose precautions if patient taking nutrition by mouth.   - May require decrease in her basal insulin  with the expected decrease in her oral intake in the coming week    Discharge Recommendations:  - resume home regimen    Thank you for this consult. Discussed plan with primary team.     Endocrinology will sign off. Please re-consult if BG > 250 on more than 2 occasions or change in nutrition, if needed.     Please page with questions or concerns: Endocrine fellow on call: [phone]      Subjective:  Initial HPI:  Patricia [name] is a 48 y.o. 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 We have been consulted at the request of Patricia Dasie Craft, MD to evaluate Patricia [name] for hyperglycemia.     She reports frustration with the lack of control over her glucose checks and insulin  administration in the hospital. She has been doing carb counting at home with ratio 1:8 and would like to be able to receive nutritional insulin  prior to meals.     Receiving 10-18 units of Lispro daily on top of Lantus  18 units daily.   Fasting BG 238 on 11/18.   Normal renal function.     Diabetes History:  Patient has a history of Type 2 diabetes diagnosed 09/2023.  Diabetes is managed by: [facility] Endocrinology fellows clinic (Dr. 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    Scheduled Medications[1]    Current Outpatient Medications Medication Instructions    albuterol  HFA 90 mcg/actuation inhaler 2 puffs, Inhalation, Every 6 hours PRN    aspirin  (ECOTRIN) 81 mg, Daily (standard)    blood sugar diagnostic (GLUCOSE BLOOD) Strp Use to check blood sugar two times a day.    blood-glucose meter kit Use to check blood sugar two times a day. Use as instructed.    blood-glucose sensor (DEXCOM G7 SENSOR) Devi 1 each, Miscellaneous, Every 10 days    cetirizine  (ZYRTEC ) 10 mg, Daily (standard)    dapsone  100 mg, Oral, Daily (standard), TAKE 1 TABLET(100 MG) BY MOUTH DAILY    escitalopram  oxalate (LEXAPRO ) 10 mg, Oral, Daily (standard)    insulin  aspart (NOVOLOG  FLEXPEN) 100 unit/mL (3 mL) injection pen Use up to 30 units/day, divided TID AC meals as per MD instructions    lancets Misc Use to check blood sugar as directed 2 times a day & for symptoms of high or low blood sugar.    LANTUS  SOLOSTAR U-100 INSULIN  25 Units, Subcutaneous, Daily (standard)    letermovir  (PREVYMIS ) 480 mg, Oral, At bedtime    LORazepam  (ATIVAN ) 0.5 mg, Oral, Every 8 hours PRN    losartan  (COZAAR ) 50 mg, Oral, Daily (standard)    valACYclovir  (VALTREX ) 500 mg, Oral, Daily (standard)     Past Medical History[2]    Past Surgical History[3]    Family History[4]    Short Social History[5]    OBJECTIVE:  BP 101/73  - Pulse 110  - Temp 36.9 ??C (98.4 ??F) (Oral) Comment: 30 min VS plts - Resp 18  - Ht 168 cm (5' 6.14)  - Wt 78 kg (171 lb 14.4 oz)  - SpO2 97%  - BMI 27.63 kg/m??   Wt Readings from Last 12 Encounters:   06/20/24 78 kg (171 lb 14.4 oz)   06/05/24 79.7 kg (175 lb 11.3 oz)   05/29/24 78.8 kg (173 lb 11.6 oz)   05/29/24 79.8 kg (175 lb 14.8 oz)   05/23/24 78.7 kg (173 lb 6.4 oz)   05/17/24 79.6 kg (175 lb 9.5 oz)   05/16/24 79.9 kg (176 lb 2.4 oz)   05/09/24 80.4 kg (177 lb 4 oz)   05/02/24 81.4 kg (179 lb 5.5 oz)   04/25/24 81.7 kg (180 lb 0.1 oz)   04/18/24 81.9 kg (180 lb 9.6 oz)   04/16/24 82.7 kg (182 lb 6.4 oz)     Physical Exam  Constitutional:       General: She is not in acute distress.     Appearance: Normal appearance.   HENT:      Mouth/Throat:      Mouth: Mucous membranes are moist.   Pulmonary:      Effort: Pulmonary effort is normal.   Neurological:      Mental Status: She is alert.         Glucose summary.   Glucose, POC (mg/dL)  Date Value   06/20/2024 181 (A)   06/20/2024 200 (A)   06/20/2024 105   06/17/2024 238 (H)   06/16/2024 167   06/16/2024 324 (H)   06/16/2024 364 (H)   06/16/2024 183 (H)   06/15/2024 297 (H)   06/15/2024 358 (H)   06/15/2024 363 (H)        Summary of labs:  Lab Results   Component Value Date    A1C 5.0 03/22/2024    A1C 7.1 (H) 10/17/2023    A1C <3.8 (L) 11/15/2022     Lab Results   Component Value Date    CREATININE 0.56 06/20/2024     Lab Results   Component Value Date    WBC 0.1 (LL) 06/20/2024    HGB 8.4 (L) 06/20/2024    HCT 23.9 (L) 06/20/2024    PLT >8 (LL) 06/20/2024       Lab Results   Component Value Date    NA 143 06/20/2024    K 4.1 06/20/2024    CL 109 (H) 06/20/2024    CO2 23.0 06/20/2024    BUN 10 06/20/2024    CREATININE 0.56 06/20/2024    GLU 184 (H) 06/20/2024    CALCIUM  8.7 06/20/2024    MG 1.5 (L) 06/20/2024    PHOS 4.5 06/19/2024       Lab Results   Component Value Date    BILITOT 0.2 (L) 06/20/2024    BILIDIR <0.10 06/20/2024    PROT 5.7 06/20/2024    ALBUMIN 3.3 (L) 06/20/2024    ALT 55 (H) 06/20/2024    AST 24 06/20/2024    ALKPHOS 59 06/20/2024    GGT 836 (H) 11/25/2023       Lab Results   Component Value Date    INR 1.08 06/19/2024    APTT 29.6 06/19/2024              [1]    cetirizine   10 mg Oral Daily    escitalopram  oxalate  10 mg Oral Daily    famotidine   20 mg Oral BID    [START ON 06/24/2024] filgrastim   480 mcg Subcutaneous Q24H    fluconazole   400 mg Oral Daily    heparin , porcine (PF)  2 mL Intravenous Mon,Wed,Fri    insulin  glargine  18 Units Subcutaneous Daily    insulin  lispro  0-20 Units Subcutaneous 3xd Meals    insulin  lispro  0-20 Units Subcutaneous TID AC    letermovir   480 mg Oral Nightly levoFLOXacin   500 mg Oral Daily    [Provider Hold] losartan   50 mg Oral Daily    [START ON 06/23/2024] methotrexate  (Preservative Free)  5 mg/m2 (Treatment Plan Adjusted) Intravenous Once    OLANZapine   7.5 mg Oral Nightly    ondansetron   8 mg Oral TID    tacrolimus   1.5 mg Oral Daily    tacrolimus   2 mg Oral Nightly    valACYclovir   500 mg Oral BID   [2]   Past Medical History:  Diagnosis Date    Anxiety     B-cell acute lymphoblastic leukemia (ALL)    (CMS-HCC) 10/29/2021    Cytomegalovirus (CMV) viremia    (CMS-HCC)     Diabetes mellitus (CMS-HCC)     Hypertension    [3]   Past Surgical History:  Procedure Laterality Date    BREAST CYST EXCISION Right     age 38    CHEMOTHERAPY      leukemia    IR  INSERT PORT AGE GREATER THAN 5 YRS  12/08/2021    IR INSERT PORT AGE GREATER THAN 5 YRS 12/08/2021 Charmaine Arloa Provencal, PA IMG VIR [facility]   [4] History reviewed. No pertinent family history.  [5]   Social History  Tobacco Use    Smoking status: Former     Current packs/day: 0.00     Types: Cigarettes     Quit date: 11/12/2015     Years since quitting: 8.6    Smokeless tobacco: Never   Vaping Use    Vaping status: Former   Substance Use Topics    Alcohol use: Not Currently    Drug use: Not Currently

## 2024-06-21 NOTE — Plan of Care (Signed)
 Pt day +9 from Allo SCT. Afebrile, other VSS on room air. Labs drawn, Mag replacement infusing per orders. Plt of 8, one unit of platelets infusing, plt recheck pending. Pt free from falls/injuries this shift.     Problem: Adult Inpatient Plan of Care  Goal: Absence of Hospital-Acquired Illness or Injury  06/21/2024 0650 by Theophilus Begun, RN  Outcome: Shift Focus  06/21/2024 0429 by Theophilus Begun, RN  Outcome: Shift Focus  Intervention: Identify and Manage Fall Risk  Recent Flowsheet Documentation  Taken 06/20/2024 2029 by Theophilus Begun, RN  Safety Interventions:   bleeding precautions   environmental modification   fall reduction program maintained   infection management   isolation precautions   lighting adjusted for tasks/safety   low bed   neutropenic precautions   nonskid shoes/slippers when out of bed  Intervention: Prevent Infection  Recent Flowsheet Documentation  Taken 06/20/2024 2029 by Theophilus Begun, RN  Infection Prevention:   cohorting utilized   environmental surveillance performed   equipment surfaces disinfected   hand hygiene promoted   personal protective equipment utilized   rest/sleep promoted   single patient room provided   visitors restricted/screened     Problem: Infection  Goal: Absence of Infection Signs and Symptoms  06/21/2024 0650 by Theophilus Begun, RN  Outcome: Shift Focus  06/21/2024 0429 by Theophilus Begun, RN  Outcome: Shift Focus  Intervention: Prevent or Manage Infection  Recent Flowsheet Documentation  Taken 06/20/2024 2029 by Theophilus Begun, RN  Infection Management: aseptic technique maintained  Isolation Precautions: protective precautions maintained     Problem: Stem Cell/Bone Marrow Transplant  Goal: Optimal Coping with Transplant  06/21/2024 0650 by Theophilus Begun, RN  Outcome: Shift Focus  06/21/2024 0429 by Theophilus Begun, RN  Outcome: Shift Focus  Goal: Blood Counts Within Acceptable Range  Intervention: Monitor and Manage Hematologic Symptoms  Recent Flowsheet Documentation  Taken 06/20/2024 2029 by Theophilus Begun, RN  Bleeding Precautions:   blood pressure closely monitored   coagulation study results reviewed   foot protection facilitated   gentle oral care promoted   monitored for signs of bleeding  Goal: Absence of Infection  Intervention: Prevent and Manage Infection  Recent Flowsheet Documentation  Taken 06/20/2024 2029 by Theophilus Begun, RN  Infection Management: aseptic technique maintained  Infection Prevention:   cohorting utilized   environmental surveillance performed   equipment surfaces disinfected   hand hygiene promoted   personal protective equipment utilized   rest/sleep promoted   single patient room provided   visitors restricted/screened  Isolation Precautions: protective precautions maintained     Problem: Fall Injury Risk  Goal: Absence of Fall and Fall-Related Injury  06/21/2024 0650 by Theophilus Begun, RN  Outcome: Shift Focus  06/21/2024 0429 by Theophilus Begun, RN  Outcome: Shift Focus  Intervention: Promote Injury-Free Environment  Recent Flowsheet Documentation  Taken 06/20/2024 2029 by Theophilus Begun, RN  Safety Interventions:   bleeding precautions   environmental modification   fall reduction program maintained   infection management   isolation precautions   lighting adjusted for tasks/safety   low bed   neutropenic precautions   nonskid shoes/slippers when out of bed

## 2024-06-21 NOTE — Progress Notes (Signed)
 BONE MARROW TRANSPLANT AND CELLULAR THERAPY PROGRESS NOTE    [name]  MRN: [id number]  Encounter Date: 06/21/24    Referring physician:  Dr. Toribio Bunker  BMT Attending MD: Dr. Jeaneen    Disease: Ph+B ALL  Current disease status: CR MRD Negative  Type of Transplant: RIC MUD Allo  Graft Source: Fresh PBSCs  Transplant Day: +9     Identifying Statement:  Patricia [name] is a 48 y.o. 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 Patricia [name] now presents for a matched unrelated donor stem cell transplant.      Interval History:  - [name]. Afebrile, VSS. Slept well.  - Continues with nausea. Had an episode of emesis this morning. Continue sched Zofran  TID, Zyprexa  7.5mg  HS.  - Continues with some loose stools. 2 in the last 24h. Continue Imodium  PRN.   - PO intake 1.3 L. Will repeat 1L bolus today. Weight change: 0.227 kg (8 oz)  - Fasting BG 90s this morning per Dexcom. Continue Lantus  to 18u, correctional and nutritional SSI.   - Suprapubic pain stable. BK detected. Denies bleeding.     Review of Systems:  A full system review was performed and was negative except as noted in the above interval history.    Temp:  [36.6 ??C (97.9 ??F)-37.2 ??C (99 ??F)] 37.2 ??C (99 ??F)  Pulse:  [86-110] 94  Resp:  [16-18] 16  BP: (101-134)/(69-91) 110/72  MAP (mmHg):  [80-103] 84  SpO2:  [96 %-100 %] 97 %    I/O last 3 completed shifts:  In: 3442 [P.O.:2273; I.V.:1119; IV Piggyback:50]  Out: 5975 [Urine:5975]  I/O this shift:  In: 396.5 [I.V.:114; Blood:282.5]  Out: -     Last 5 Recorded Weights    06/16/24 2037 06/17/24 2000 06/18/24 2000 06/19/24 2059   Weight: 77.3 kg (170 lb 6.7 oz) 77.9 kg (171 lb 11.2 oz) 78.4 kg (172 lb 14.4 oz) 77.7 kg (171 lb 6.4 oz)    06/20/24 1945   Weight: 78 kg (171 lb 14.4 oz)     Weight change: 0.227 kg (8 oz)    Test Results:   Reviewed in EPIC. Abnormal values discussed below.     Scheduled Meds:Scheduled Medications[1]  Continuous Infusions:Infusions Meds[2]  PRN Meds:.PRN Medications[3]    Physical Exam: no change from 06/20/24  General : Sitting in bed. Appears in no acute distress.  Central venous access: Line clean, dry, intact. No erythema or drainage noted.   ENT: Moist mucous membranes. Oropharhynx without erythema or exudate. Lesion to her lower gums, behind incisors.  Cardiovascular: Pulse normal rate, regularity and rhythm. S1 and S2 normal, without any murmur, rub, or gallop.  Lungs: Clear to auscultation bilaterally, without wheezes/crackles/rhonchi. Good air movement.   Skin: Pale. Warm, dry, intact. No rash noted.   Psychiatry: Alert and oriented to person, place, and time.   Gastrointestinal: Normoactive bowel sounds, abdomen soft, non-tender   Musculoskeletal/Extremities: Full range of motion in shoulder, elbow, hip knee, ankle, wrists and feet. No edema.   Neurologic: CNII-XII intact. Normal strength and sensation throughout    Assessment/Plan:    BMT: ALL  CR MRD Negative  HCT-CI (age adjusted) 16 (age, anxiety, reduced DLCO, HF (reduced EF), T2DM, CMV viremia)  Conditioning:.  1. Fludarabine  40 mg/m2 days -5, -4, -3, -2  2. Melphalan  100  mg/m2 day -1      Donor: 10/10, Female, ABO O-, CMV negative  Engraftment: G-CSF starting Day + 5 through WBC recovery (as  defined as ANC 1.0 x 2 days or 3.0 x 1 day)  -Date of last G-CSF injection: TBD     GVHD prophylaxis:   1.Tacrolimus  starting on day -3 (goal 5-10 ng/mL)  2. Methotrexate  5 mg/m2 IVP on days +1, +3, +6 and +11  3.  ATG per [facility] standard dosing will not be included     Hem: Transfusion criteria: Transfuse 1 unit of PRBCs for hemoglobin <7 and 1 unit of platelets for Plt <10K or bleeding. [name] [name] does not have a history of transfusion reactions. Consent was obtained and documented on 06/05/24.    ID:   Prophylaxis:  -Antiviral: Valtrex  500 mg po BID started on admission and continue for 2 year      Letermovir  indicated as R/CMV + D/CMV - and with prior hx of CMV viremia, plan to continue on admission through at least day +200  -Antifungal: Fluconazole  400 mg po daily to start day 0 thorugh day +75  -Antibacterial: Levaquin  500 mg po daily to start day 0, continue while neutropenic  -PJP: Sulfa allergic. Dapsone  daily upon platelet engraftment, has tolerated this well in the past. Toxo PCR negative.   -11/7 stopped Dapsone  upon admission, restart with platelet engraftment.      Patient was screened for upper respiratory symptoms, including S/S of Covid-19. bmt prescreen symptoms: Patient reports upper respiratory symptoms.  bmtu rpp testing: RPP to be ordered and performed ahead of BMTU admission. Patient educated on continued need to inform medical team of any new symptoms.     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   Previously required Valcyte  treatment  - Continue Letermovir  and Valtrex  BID through day +200 post SCT.      Recurrent Acute Sinusitis  Recurrent acute sinusitis with nasal congestion, frontal headache, and low-grade fever. Responded well to Augmentin .  -11/7 current sx resolving, started Augmentin  10 days (10/30-11/9)     Hypogammaglobulinemia  Monthly IVIG - last on 05/16/2024  - 11/7 check weekly IgG levels     GI:   GERD prophylaxis:  -Pepcid  BID     Nausea:  -Anti-emetics per BMT protocol.   - 11/14: Zyprexa  5 mg nightly  - 11/19: Zofran  sched TID, Increased Zyprexa  to 7.5mg  HS     Diarrhea:  -Antidiarrheals per BMT protocol    Renal: Creatinine normal. No issues.     Dysuria:   - 11/19: Noted suprapubic pain/tenderness. UA unrevealing. BK,Urine detected < 2.30 log.     FEN:    Electrolyte replacement per protocol  - 11/15 - 11/21: 1L NS bolus x1    Hepatic: Normal bilirubin. SOS prophylaxis not indicated with RIC regimen.   - 06/17/24: Rise in AST/ALT, monitor LFTs daily     CV: Followed by [facility]: Heart failure with mildly reduced ejection fraction (HFmrEF)   Hx of EF 45-50%, most recent Echo 05/23/24 improved to 55-60%  Hx of prolonged QTc, last EKG 489 follow closely with addition of QTc prolonging medications  -11/7 QTc 457. Check weekly EKGs (next on 11/14), added cardiac lytes   -11/8: 40 meq of K  -11/9: 20 meq of K     Hypertension:    - Home med Losartan  50 mg daily (held 11/18)    Vasovagal Episode:  -11/14: Lightheadedness with a fall overnight 2am. (-) stat head CT w/o contrast. Neuro exam intact  -11/16: Feeling well, denies N/V, HA/lightheaded.    Pulm: No pulmonary symptoms currently. Reduced DLCO  Endocrine:   Menses suppression: Lupron  last 07/26/22. 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 At home uses Dexcom for monitoring.  - 11/7 ACHS and SSI while inpatient. Lantus  25 units in AM. Can consider using Dexcom next week if numbers similar to hospital glucometer results   - 11/8: holding Lantus  due to morning POC 89  - 11/9: Blood glucose range 131 -189, resumed Lantus  at lower dose 10 Units  - 11/11: BG 200-400s in setting of steroids. Increased Lantus  to 15u.  - 11/12: BG 300s. Increased Lantus  to 18u.   - 11/16: BG range 288-363 last 24 hours, adjust SSI insulin  sensitivity factor  - 11/18: Appreciate endocrinology recs. Increased Lantus  20u. Placed on nutritional and correctional SSI.   - 11/20: Poor PO intake, decrease Lantus  to 18u.    Neuro/Pain:  - PRN tramadol  50 mg added. No oxy as this give patient migraines   - 11/8: Tramadol  was not helping, Morphine  with mild relief. Ordered dilaudid  0.5mg  q8 prn x 2 days for severe pain, removed tramadol  prn.  - 11/11: Tramadol  50mg  Q6H ordered PRN with goal to transition to oral medications for pain control.  - 11/12: Discontinued Dilaudid . Manage chronic hip/back pain with Tramadol  q6h PRN + heat therapy.     Psych: Followed by Palliative Care     Anxiety: Home med: Lexapro  10 mg daily   Reconnected with her local therapist   - If pt continues to endorse anxiety, may increase Lexapro  to 20 mg daily  - 11/7 added PRN ativan  0.5 mg Q8     Caregivers: Lives in [place], [place]  Primary caregiver: Jeanie Gomm (mom)- first 30 days at [facility] then home if doing well will stay longer if medically needed  Secondary caregiver: Devaughn Ranger (husband)     Consults: While inpatient, consider consults to music therapy, integrative medicine and massage therapy.       Brodan Grewell Jo-an Elva Levy, ACNP  [facility]               [1]    cetirizine   10 mg Oral Daily    escitalopram  oxalate  10 mg Oral Daily    famotidine   20 mg Oral BID    [START ON 06/24/2024] filgrastim   480 mcg Subcutaneous Q24H    fluconazole   400 mg Oral Daily    heparin , porcine (PF)  2 mL Intravenous Mon,Wed,Fri    insulin  glargine  18 Units Subcutaneous Daily    insulin  lispro  0-20 Units Subcutaneous 3xd Meals    insulin  lispro  0-20 Units Subcutaneous TID AC    letermovir   480 mg Oral Nightly    levoFLOXacin   500 mg Oral Daily    [Provider Hold] losartan   50 mg Oral Daily    [START ON 06/23/2024] methotrexate  (Preservative Free)  5 mg/m2 (Treatment Plan Adjusted) Intravenous Once    OLANZapine   7.5 mg Oral Nightly    ondansetron   8 mg Oral TID    tacrolimus   1.5 mg Oral Daily    tacrolimus   2 mg Oral Nightly    valACYclovir   500 mg Oral BID   [2]    IP okay to treat      sodium chloride  20 mL/hr (06/07/24 1326)   [3] albuterol , aluminum-magnesium  hydroxide-simethicone, CETAPHIL, dexAMETHasone , dextrose  in water, diphenhydrAMINE , emollient combination no.92, EPINEPHrine  IM, famotidine  (PEPCID ) IV, glucagon , glucose, IP okay to treat, magic mouthwash oral **AND** lidocaine ,  loperamide , loperamide , LORazepam , magnesium  sulfate, methylPREDNISolone  sodium succinate , phenol, potassium chloride  in water, prochlorperazine  **OR** prochlorperazine , sodium chloride  0.9%, traMADol 

## 2024-06-21 NOTE — Plan of Care (Signed)
 Patient AOx4, VSS, afebrile. Patient received 1 PRN dose of IV Compazine  for vomiting with good relief.  No falls or acute events this shift. Plan of Care reviewed with patient.  Safety measures in place of bed low with brakes locked, non-skid footwear on while out of bed, call bell within reach.        Problem: Adult Inpatient Plan of Care  Goal: Absence of Hospital-Acquired Illness or Injury  Outcome: Shift Focus  Intervention: Identify and Manage Fall Risk  Recent Flowsheet Documentation  Taken 06/21/2024 0708 by Toma Emmie RAMAN, RN  Safety Interventions:   bleeding precautions   chemotherapeutic agent precautions   environmental modification   fall reduction program maintained   family at bedside   infection management   isolation precautions   lighting adjusted for tasks/safety   low bed   neutropenic precautions   nonskid shoes/slippers when out of bed  Intervention: Prevent Skin Injury  Recent Flowsheet Documentation  Taken 06/21/2024 0845 by Toma Emmie RAMAN, RN  Positioning for Skin: Sitting in Chair  Taken 06/21/2024 0708 by Toma Emmie RAMAN, RN  Positioning for Skin: (in Bathroom) Other (Comment)  Intervention: Prevent Infection  Recent Flowsheet Documentation  Taken 06/21/2024 0708 by Toma Emmie RAMAN, RN  Infection Prevention: cohorting utilized  Goal: Optimal Comfort and Wellbeing  Outcome: Shift Focus  Goal: Readiness for Transition of Care  Outcome: Shift Focus  Goal: Rounds/Family Conference  Outcome: Shift Focus     Problem: Infection  Goal: Absence of Infection Signs and Symptoms  Outcome: Shift Focus  Intervention: Prevent or Manage Infection  Recent Flowsheet Documentation  Taken 06/21/2024 0708 by Toma Emmie RAMAN, RN  Infection Management: aseptic technique maintained  Isolation Precautions: protective precautions maintained

## 2024-06-21 NOTE — Progress Notes (Signed)
 CIBMTR DATA FROM [facility] LABORATORY    This data is obtained from the [facility] Laboratory Chart regarding this patient's HPC product infusion.    Infusion Date: 06-12-2024    Weight from infusion form: 78.8 kg    Viability: 99%  Viability method: Flow    Product Culture: Negative    Date/Time product received at [facility]: 06-11-2024 @2249   Shipping environment: Cooled      Leodan Bolyard A. Anant Agard, MD  [facility] Laboratory Medical Director

## 2024-06-22 LAB — BASIC METABOLIC PANEL
ANION GAP: 12 mmol/L (ref 5–14)
BLOOD UREA NITROGEN: 7 mg/dL — ABNORMAL LOW (ref 9–23)
BUN / CREAT RATIO: 12
CALCIUM: 9 mg/dL (ref 8.7–10.4)
CHLORIDE: 109 mmol/L — ABNORMAL HIGH (ref 98–107)
CO2: 24 mmol/L (ref 20.0–31.0)
CREATININE: 0.57 mg/dL (ref 0.55–1.02)
EGFR CKD-EPI (2021) FEMALE: >90 mL/min/1.73m2 (ref >=60)
GLUCOSE RANDOM: 130 mg/dL (ref 70–179)
POTASSIUM: 4.2 mmol/L (ref 3.4–4.8)
SODIUM: 145 mmol/L (ref 135–145)

## 2024-06-22 LAB — CBC W/ AUTO DIFF
BASOPHILS ABSOLUTE COUNT: 0 10*9/L (ref 0.0–0.1)
BASOPHILS RELATIVE PERCENT: 0 %
EOSINOPHILS ABSOLUTE COUNT: 0 10*9/L (ref 0.0–0.5)
EOSINOPHILS RELATIVE PERCENT: 2.3 %
HEMATOCRIT: 23.3 % — ABNORMAL LOW (ref 34.0–44.0)
HEMOGLOBIN: 8.3 g/dL — ABNORMAL LOW (ref 11.3–14.9)
LYMPHOCYTES ABSOLUTE COUNT: 0.1 10*9/L — ABNORMAL LOW (ref 1.1–3.6)
LYMPHOCYTES RELATIVE PERCENT: 78.2 %
MEAN CORPUSCULAR HEMOGLOBIN CONC: 35.7 g/dL (ref 32.0–36.0)
MEAN CORPUSCULAR HEMOGLOBIN: 32.5 pg — ABNORMAL HIGH (ref 25.9–32.4)
MEAN CORPUSCULAR VOLUME: 91.1 fL (ref 77.6–95.7)
MEAN PLATELET VOLUME: 8 fL (ref 6.8–10.7)
MONOCYTES ABSOLUTE COUNT: 0 10*9/L — ABNORMAL LOW (ref 0.3–0.8)
MONOCYTES RELATIVE PERCENT: 16.3 %
NEUTROPHILS ABSOLUTE COUNT: 0 10*9/L — CL (ref 1.8–7.8)
NEUTROPHILS RELATIVE PERCENT: 3.2 %
PLATELET COUNT: 34 10*9/L — ABNORMAL LOW (ref 150–450)
RED BLOOD CELL COUNT: 2.56 10*12/L — ABNORMAL LOW (ref 3.95–5.13)
RED CELL DISTRIBUTION WIDTH: 13.5 % (ref 12.2–15.2)
WBC ADJUSTED: 0.1 10*9/L — CL (ref 3.6–11.2)

## 2024-06-22 LAB — TACROLIMUS LEVEL, TROUGH: TACROLIMUS, TROUGH: 6.2 ng/mL (ref 5.0–15.0)

## 2024-06-22 LAB — MAGNESIUM: MAGNESIUM: 1.5 mg/dL — ABNORMAL LOW (ref 1.6–2.6)

## 2024-06-22 MED ADMIN — escitalopram oxalate (LEXAPRO) tablet 10 mg: 10 mg | ORAL | @ 14:00:00

## 2024-06-22 MED ADMIN — loperamide (IMODIUM) capsule 2 mg: 2 mg | ORAL | @ 12:00:00

## 2024-06-22 MED ADMIN — loperamide (IMODIUM) capsule 2 mg: 2 mg | ORAL | @ 01:00:00

## 2024-06-22 MED ADMIN — insulin lispro (HumaLOG) injection 0-20 Units: 0-20 [IU] | SUBCUTANEOUS | @ 14:00:00

## 2024-06-22 MED ADMIN — insulin lispro (HumaLOG) injection 0-20 Units: 0-20 [IU] | SUBCUTANEOUS | @ 18:00:00

## 2024-06-22 MED ADMIN — fluconazole (DIFLUCAN) tablet 400 mg: 400 mg | ORAL | @ 14:00:00 | Stop: 2024-07-12

## 2024-06-22 MED ADMIN — letermovir (PREVYMIS) tablet 480 mg: 480 mg | ORAL | @ 01:00:00

## 2024-06-22 MED ADMIN — magnesium sulfate 2gm/50mL IVPB: 2 g | INTRAVENOUS | @ 08:00:00

## 2024-06-22 MED ADMIN — magnesium sulfate 2gm/50mL IVPB: 2 g | INTRAVENOUS | @ 10:00:00

## 2024-06-22 MED ADMIN — cetirizine (ZYRTEC) tablet 10 mg: 10 mg | ORAL | @ 14:00:00

## 2024-06-22 MED ADMIN — valACYclovir (VALTREX) tablet 500 mg: 500 mg | ORAL | @ 14:00:00

## 2024-06-22 MED ADMIN — valACYclovir (VALTREX) tablet 500 mg: 500 mg | ORAL | @ 01:00:00

## 2024-06-22 MED ADMIN — insulin lispro (HumaLOG) injection CORRECTIONAL 0-20 Units: 0-20 [IU] | SUBCUTANEOUS | @ 18:00:00

## 2024-06-22 MED ADMIN — tacrolimus (PROGRAF) 1.5mg combo product: 1.5 mg | ORAL | @ 14:00:00

## 2024-06-22 MED ADMIN — levoFLOXacin (LEVAQUIN) tablet 500 mg: 500 mg | ORAL | @ 14:00:00 | Stop: 2024-07-12

## 2024-06-22 MED ADMIN — insulin glargine (LANTUS) injection BASAL 18 Units: 18 [IU] | SUBCUTANEOUS | @ 14:00:00

## 2024-06-22 MED ADMIN — OLANZapine (ZYPREXA) tablet 7.5 mg: 7.5 mg | ORAL | @ 01:00:00

## 2024-06-22 MED ADMIN — ondansetron (ZOFRAN) tablet 8 mg: 8 mg | ORAL | @ 10:00:00 | Stop: 2024-06-22

## 2024-06-22 MED ADMIN — tacrolimus (PROGRAF) capsule 2 mg: 2 mg | ORAL | @ 01:00:00

## 2024-06-22 MED ADMIN — lactated ringers bolus 1,000 mL: 1000 mL | INTRAVENOUS | @ 23:00:00 | Stop: 2024-06-22

## 2024-06-22 NOTE — Plan of Care (Signed)
 Patient AOx4, VSS, afebrile. Patient did not receive any PRN medications this shift. No falls or acute events this shift. Patient received 1 Liter bolus of LR this shift.  Plan of Care reviewed with patient.  Safety measures in place of bed low with brakes locked, non-skid footwear on while out of bed, call bell within reach.        Problem: Adult Inpatient Plan of Care  Goal: Absence of Hospital-Acquired Illness or Injury  Outcome: Shift Focus  Intervention: Identify and Manage Fall Risk  Recent Flowsheet Documentation  Taken 06/22/2024 0721 by Toma Emmie RAMAN, RN  Safety Interventions:   bleeding precautions   chemotherapeutic agent precautions   environmental modification   fall reduction program maintained   infection management   isolation precautions   lighting adjusted for tasks/safety   low bed   neutropenic precautions   nonskid shoes/slippers when out of bed   bed alarm  Intervention: Prevent Skin Injury  Recent Flowsheet Documentation  Taken 06/22/2024 0915 by Toma Emmie RAMAN, RN  Positioning for Skin: Supine/Back  Taken 06/22/2024 0721 by Toma Emmie RAMAN, RN  Positioning for Skin: Right  Intervention: Prevent Infection  Recent Flowsheet Documentation  Taken 06/22/2024 9278 by Toma Emmie RAMAN, RN  Infection Prevention: cohorting utilized  Goal: Optimal Comfort and Wellbeing  Outcome: Shift Focus  Goal: Readiness for Transition of Care  Outcome: Shift Focus  Goal: Rounds/Family Conference  Outcome: Shift Focus     Problem: Infection  Goal: Absence of Infection Signs and Symptoms  Outcome: Shift Focus  Intervention: Prevent or Manage Infection  Recent Flowsheet Documentation  Taken 06/22/2024 9278 by Toma Emmie RAMAN, RN  Infection Management: aseptic technique maintained  Isolation Precautions: protective precautions maintained

## 2024-06-22 NOTE — Progress Notes (Signed)
 BONE MARROW TRANSPLANT AND CELLULAR THERAPY PROGRESS NOTE    [name]  MRN: [id number]  Encounter Date: 06/22/24    Referring physician:  Dr. Toribio Bunker  BMT Attending MD: Dr. Jeaneen    Disease: Ph+B ALL  Current disease status: CR MRD Negative  Type of Transplant: RIC MUD Allo  Graft Source: Fresh PBSCs  Transplant Day: +10     Identifying Statement:  Patricia [name] is a 48 y.o. 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 Ronni now presents for a matched unrelated donor stem cell transplant.      Interval History:  - Afebrile, VSS.   - Reported 1 episode of loose BM and 1 episode of small-volume emesis.  Improved with as needed antiemetic.  Continue Imodium  PRN.   -Awaiting engraftment.  WBC 0.1, ANC 0.0.  - PO intake 1 L. Will repeat 1L bolus today. Weight change: -0.907 kg (-2 lb)    Review of Systems:  A full system review was performed and was negative except as noted in the above interval history.    Temp:  [36.8 ??C (98.2 ??F)-37.3 ??C (99.1 ??F)] 37.3 ??C (99.1 ??F)  Pulse:  [90-107] 103  Resp:  [18-20] 18  BP: (102-127)/(74-87) 102/77  MAP (mmHg):  [84-99] 85  SpO2:  [95 %-100 %] 99 %    I/O last 3 completed shifts:  In: 3132.5 [P.O.:1500; I.V.:1350; Blood:282.5]  Out: 4625 [Urine:4625]  I/O this shift:  In: 737 [P.O.:500; I.V.:237]  Out: 200 [Urine:200]    Last 5 Recorded Weights    06/18/24 2000 06/19/24 2059 06/20/24 1945 06/21/24 1608   Weight: 78.4 kg (172 lb 14.4 oz) 77.7 kg (171 lb 6.4 oz) 78 kg (171 lb 14.4 oz) 77.1 kg (169 lb 14.4 oz)    06/22/24 1633   Weight: 76.6 kg (168 lb 14.4 oz)     Weight change: -0.907 kg (-2 lb)    Test Results:   Reviewed in EPIC. Abnormal values discussed below.     Scheduled Meds:Scheduled Medications[1]  Continuous Infusions:Infusions Meds[2]  PRN Meds:.PRN Medications[3]    Physical Exam: no change from 06/20/24  General : Sitting in bed. Appears in no acute distress.  Central venous access: Line clean, dry, intact. No erythema or drainage noted.   ENT: Moist mucous membranes. Oropharhynx without erythema or exudate. Lesion to her lower gums, behind incisors.  Cardiovascular: Pulse normal rate, regularity and rhythm. S1 and S2 normal, without any murmur, rub, or gallop.  Lungs: Clear to auscultation bilaterally, without wheezes/crackles/rhonchi. Good air movement.   Skin: Pale. Warm, dry, intact. No rash noted.   Psychiatry: Alert and oriented to person, place, and time.   Gastrointestinal: Normoactive bowel sounds, abdomen soft, non-tender   Musculoskeletal/Extremities: Full range of motion in shoulder, elbow, hip knee, ankle, wrists and feet. No edema.   Neurologic: CNII-XII intact. Normal strength and sensation throughout    Assessment/Plan:    BMT: ALL  CR MRD Negative  HCT-CI (age adjusted) 34 (age, anxiety, reduced DLCO, HF (reduced EF), T2DM, CMV viremia)  Conditioning:.  1. Fludarabine  40 mg/m2 days -5, -4, -3, -2  2. Melphalan  100  mg/m2 day -1      Donor: 10/10, Female, ABO O-, CMV negative  Engraftment: G-CSF starting Day + 5 through WBC recovery (as defined as ANC 1.0 x 2 days or 3.0 x 1 day)  -Date of last G-CSF injection: TBD     GVHD prophylaxis:   1.Tacrolimus  starting on day -3 (goal  5-10 ng/mL)  2. Methotrexate  5 mg/m2 IVP on days +1, +3, +6 and +11  3.  ATG per [facility] standard dosing will not be included     Hem: Transfusion criteria: Transfuse 1 unit of PRBCs for hemoglobin <7 and 1 unit of platelets for Plt <10K or bleeding. [name] [name] does not have a history of transfusion reactions. Consent was obtained and documented on 06/05/24.    ID:   Prophylaxis:  -Antiviral: Valtrex  500 mg po BID started on admission and continue for 2 year      Letermovir  indicated as R/CMV + D/CMV - and with prior hx of CMV viremia, plan to continue on admission through at least day +200  -Antifungal: Fluconazole  400 mg po daily to start day 0 thorugh day +75  -Antibacterial: Levaquin  500 mg po daily to start day 0, continue while neutropenic  -PJP: Sulfa allergic. Dapsone  daily upon platelet engraftment, has tolerated this well in the past. Toxo PCR negative.   -11/7 stopped Dapsone  upon admission, restart with platelet engraftment.      Patient was screened for upper respiratory symptoms, including S/S of Covid-19. bmt prescreen symptoms: Patient reports upper respiratory symptoms.  bmtu rpp testing: RPP to be ordered and performed ahead of BMTU admission. Patient educated on continued need to inform medical team of any new symptoms.     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   Previously required Valcyte  treatment  - Continue Letermovir  and Valtrex  BID through day +200 post SCT.      Recurrent Acute Sinusitis  Recurrent acute sinusitis with nasal congestion, frontal headache, and low-grade fever. Responded well to Augmentin .  -11/7 current sx resolving, started Augmentin  10 days (10/30-11/9)     Hypogammaglobulinemia  Monthly IVIG - last on 05/16/2024  - 11/7 check weekly IgG levels     GI:   GERD prophylaxis:  -Pepcid  BID     Nausea:  -Anti-emetics per BMT protocol.   - 11/14: Zyprexa  5 mg nightly  - 11/19: Zofran  sched TID, Increased Zyprexa  to 7.5mg  HS     Diarrhea:  -Antidiarrheals per BMT protocol    Renal: Creatinine normal. No issues.     Dysuria:   - 11/19: Noted suprapubic pain/tenderness. UA unrevealing. BK,Urine detected < 2.30 log.     FEN:    Electrolyte replacement per protocol  - 11/15 - 11/21: 1L NS bolus x1    Hepatic: Normal bilirubin. SOS prophylaxis not indicated with RIC regimen.   - 06/17/24: Rise in AST/ALT, monitor LFTs daily     CV: Followed by [facility]iology: Heart failure with mildly reduced ejection fraction (HFmrEF)   Hx of EF 45-50%, most recent Echo 05/23/24 improved to 55-60%  Hx of prolonged QTc, last EKG 489 follow closely with addition of QTc prolonging medications  -11/7 QTc 457. Check weekly EKGs (next on 11/14), added cardiac lytes   -11/8: 40 meq of K  -11/9: 20 meq of K     Hypertension: - Home med Losartan  50 mg daily (held 11/18)    Vasovagal Episode:  -11/14: Lightheadedness with a fall overnight 2am. (-) stat head CT w/o contrast. Neuro exam intact  -11/16: Feeling well, denies N/V, HA/lightheaded.    Pulm: No pulmonary symptoms currently. Reduced DLCO      Endocrine:   Menses suppression: Lupron  last 07/26/22. 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 At home uses Dexcom for monitoring.  -  11/7 ACHS and SSI while inpatient. Lantus  25 units in AM. Can consider using Dexcom next week if numbers similar to hospital glucometer results   - 11/8: holding Lantus  due to morning POC 89  - 11/9: Blood glucose range 131 -189, resumed Lantus  at lower dose 10 Units  - 11/11: BG 200-400s in setting of steroids. Increased Lantus  to 15u.  - 11/12: BG 300s. Increased Lantus  to 18u.   - 11/16: BG range 288-363 last 24 hours, adjust SSI insulin  sensitivity factor  - 11/18: Appreciate endocrinology recs. Increased Lantus  20u. Placed on nutritional and correctional SSI.   - 11/20: Poor PO intake, decrease Lantus  to 18u.    Neuro/Pain:  - PRN tramadol  50 mg added. No oxy as this give patient migraines   - 11/8: Tramadol  was not helping, Morphine  with mild relief. Ordered dilaudid  0.5mg  q8 prn x 2 days for severe pain, removed tramadol  prn.  - 11/11: Tramadol  50mg  Q6H ordered PRN with goal to transition to oral medications for pain control.  - 11/12: Discontinued Dilaudid . Manage chronic hip/back pain with Tramadol  q6h PRN + heat therapy.     Psych: Followed by Palliative Care     Anxiety: Home med: Lexapro  10 mg daily   Reconnected with her local therapist   - If pt continues to endorse anxiety, may increase Lexapro  to 20 mg daily  - 11/7 added PRN ativan  0.5 mg Q8     Caregivers: Lives in [place], [place]  Primary caregiver: Jeanie Mccleod (mom)- first 30 days at [facility] then home if doing well will stay longer if medically needed  Secondary caregiver: Devaughn Ranger (husband) Consults: While inpatient, consider consults to music therapy, integrative medicine and massage therapy.       Gery Francis, MD  [facility] Bone Marrow Transplant and Cellular Therapy Program                 [1]    cetirizine   10 mg Oral Daily    escitalopram  oxalate  10 mg Oral Daily    famotidine   20 mg Oral BID    [START ON 06/24/2024] filgrastim   480 mcg Subcutaneous Q24H    fluconazole   400 mg Oral Daily    heparin , porcine (PF)  2 mL Intravenous Mon,Wed,Fri    insulin  glargine  18 Units Subcutaneous Daily    insulin  lispro  0-20 Units Subcutaneous 3xd Meals    insulin  lispro  0-20 Units Subcutaneous TID AC    lactated ringers   1,000 mL Intravenous Once    letermovir   480 mg Oral Nightly    levoFLOXacin   500 mg Oral Daily    [Provider Hold] losartan   50 mg Oral Daily    [START ON 06/23/2024] methotrexate  (Preservative Free)  5 mg/m2 (Treatment Plan Adjusted) Intravenous Once    OLANZapine   7.5 mg Oral Nightly    tacrolimus   1.5 mg Oral Daily    tacrolimus   2 mg Oral Nightly    valACYclovir   500 mg Oral BID   [2]    IP okay to treat      sodium chloride  20 mL/hr (06/07/24 1326)   [3] albuterol , aluminum-magnesium  hydroxide-simethicone, CETAPHIL, dexAMETHasone , dextrose  in water, diphenhydrAMINE , emollient combination no.92, EPINEPHrine  IM, famotidine  (PEPCID ) IV, glucagon , glucose, IP okay to treat, magic mouthwash oral **AND** lidocaine , loperamide , loperamide , LORazepam , magnesium  sulfate, methylPREDNISolone  sodium succinate , ondansetron , phenol, potassium chloride  in water, prochlorperazine  **OR** prochlorperazine , sodium chloride  0.9%, traMADol 

## 2024-06-22 NOTE — Plan of Care (Addendum)
 Pt day +10 allo transplant. Patient A&O X4, VSS, afebrile throughout shift. Pt glucose per dexcom was 212, pt refused insulin  corrections. PRNs for shift include imodium  twice for diarrhea and magnesium  replacements. Pt was placed on bed alarm overnight due to fall risk. Pt was complaining of nausea this morning, but denied need for medication. Lines and claves changed. No acute events throughout shift. Plan of care reviewed with patient and applicable visitors, safety measures in place throughout shift.           Problem: Adult Inpatient Plan of Care  Goal: Absence of Hospital-Acquired Illness or Injury  Intervention: Identify and Manage Fall Risk  Recent Flowsheet Documentation  Taken 06/21/2024 2030 by Kristofor Michalowski E, RN  Safety Interventions:   aspiration precautions   bleeding precautions   commode/urinal/bedpan at bedside   fall reduction program maintained   isolation precautions   low bed   lighting adjusted for tasks/safety   neutropenic precautions   nonskid shoes/slippers when out of bed  Intervention: Prevent Skin Injury  Recent Flowsheet Documentation  Taken 06/21/2024 2030 by Kenneth Lax E, RN  Positioning for Skin: Supine/Back  Device Skin Pressure Protection: adhesive use limited  Skin Protection: adhesive use limited  Intervention: Prevent Infection  Recent Flowsheet Documentation  Taken 06/21/2024 2030 by Oluwatoyin Banales, Lorrayne E, RN  Infection Prevention:   cohorting utilized   environmental surveillance performed   equipment surfaces disinfected   hand hygiene promoted   personal protective equipment utilized   rest/sleep promoted   single patient room provided   visitors restricted/screened     Problem: Infection  Goal: Absence of Infection Signs and Symptoms  Intervention: Prevent or Manage Infection  Recent Flowsheet Documentation  Taken 06/21/2024 2030 by Oveda Dadamo, Lorrayne E, RN  Infection Management: aseptic technique maintained  Isolation Precautions: protective precautions maintained     Problem: Stem Cell/Bone Marrow Transplant  Goal: Diarrhea Symptom Control  Intervention: Manage Diarrhea  Recent Flowsheet Documentation  Taken 06/21/2024 2030 by Zendaya Groseclose, Lorrayne E, RN  Skin Protection: adhesive use limited  Goal: Improved Activity Tolerance  Intervention: Promote Improved Energy  Recent Flowsheet Documentation  Taken 06/21/2024 2030 by Adorian Gwynne, Lorrayne E, RN  Activity Management: up ad lib  Goal: Blood Counts Within Acceptable Range  Intervention: Monitor and Manage Hematologic Symptoms  Recent Flowsheet Documentation  Taken 06/21/2024 2030 by Jarod Bozzo, Lorrayne E, RN  Bleeding Precautions:   blood pressure closely monitored   coagulation study results reviewed   foot protection facilitated   monitored for signs of bleeding   gentle oral care promoted  Goal: Absence of Infection  Intervention: Prevent and Manage Infection  Recent Flowsheet Documentation  Taken 06/21/2024 2030 by Ilhan Madan E, RN  Infection Management: aseptic technique maintained  Infection Prevention:   cohorting utilized   environmental surveillance performed   equipment surfaces disinfected   hand hygiene promoted   personal protective equipment utilized   rest/sleep promoted   single patient room provided   visitors restricted/screened  Isolation Precautions: protective precautions maintained  Goal: Improved Oral Mucous Membrane Health and Integrity  Intervention: Promote Oral Comfort and Health  Recent Flowsheet Documentation  Taken 06/21/2024 2030 by Makala Fetterolf E, RN  Oral Mucous Membrane Protection: lip/mouth moisturizer applied     Problem: Fall Injury Risk  Goal: Absence of Fall and Fall-Related Injury  Intervention: Promote Scientist, Clinical (histocompatibility And Immunogenetics) Documentation  Taken 06/21/2024 2030 by Charles Niese, Lorrayne E, RN  Safety Interventions:   aspiration precautions   bleeding precautions  commode/urinal/bedpan at bedside   fall reduction program maintained   isolation precautions   low bed   lighting adjusted for tasks/safety neutropenic precautions   nonskid shoes/slippers when out of bed     Problem: Diabetes  Goal: Optimal Functional Ability  Intervention: Optimize Functional Ability  Recent Flowsheet Documentation  Taken 06/21/2024 2030 by Elysia Grand E, RN  Activity Management: up ad lib

## 2024-06-22 NOTE — Consults (Signed)
 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     Date of Transplant: 06/12/24      Prior Dosing Information: see table below     Source(s) of information used to determine prior to admission dosing: MAR    Goals:  Therapeutic Drug Levels  Tacrolimus  trough goal: 5-10 ng/mL    Additional Clinical Monitoring/Outcomes  Monitor renal function (SCr and urine output) and liver function (LFTs)  Monitor for signs/symptoms of adverse events (e.g., hyperglycemia, hyperkalemia, hypomagnesemia, hypertension, headache, tremor)    Previous Lab Results:  Tacrolimus , Trough   Date Value Ref Range Status   06/22/2024 6.2 5.0 - 15.0 ng/mL Final   06/20/2024 6.5 5.0 - 15.0 ng/mL Final   06/18/2024 6.8 5.0 - 15.0 ng/mL Final   06/16/2024 3.1 (L) 5.0 - 15.0 ng/mL Final   06/14/2024 9.4 5.0 - 15.0 ng/mL Final     Creatinine   Date Value Ref Range Status   06/22/2024 0.57 0.55 - 1.02 mg/dL Final   88/79/7974 9.43 0.55 - 1.02 mg/dL Final   88/80/7974 9.44 0.55 - 1.02 mg/dL Final      Result:  Tacrolimus  level from today was drawn appropriately ~12 hours from previous dose    Pharmacokinetic Considerations and Significant Drug Interactions:  Concurrent CYP3A4 substrates/inhibitors: fluconazole  (moderate inhibitor, started 11/12), letermovir  (moderate inhibitor, initiated 05/2023)    Assessment/Plan:  Recommendation(s)  Trough continues to be therapeutic at 6.2 today  Fluconazole  and Letermovir  have been at steady state and should not have a continued affect on tacrolimus  levels  SCr stable/WNL  Bili/LFTs from 11/20 WNL except ALT slightly elevated  Given the above, plan to continue tacrolimus  1.5 mg qAM and 2 mg qPM.    Follow-up  Next level has been ordered on 11/24 at 0800.   A pharmacist will continue to monitor and recommend levels as appropriate    Longitudinal Dose Monitoring:  Date Dose (mg), Route AM Scr (mg/dL) Level  (ng/mL) Key Drug Interactions   11/9 3.5/3.5 0.51  Letermovir    11/10 3.5/3.5 0.44  Letermovir    11/11 2.5/--- 0.51 16.1 Letermovir    11/12 ---/--- 0.53 10.7 Letermovir , Fluconazole  started   11/13 2/2 0.52 -- Letermovir , Fluconazole    11/14 2/-- 0.55 9.4 Letermovir , Fluconazole    11/15 1.5/1.5 0.48  Letermovir , Fluconazole    11/16 1.5/2 0.55 3.1 Letermovir , Fluconazole     11/17 1.5/2 0.45  Letermovir , Fluconazole     11/18 1.5/2 0.48 6.8 Letermovir , Fluconazole     11/19 1.5/2 0.69  Letermovir , Fluconazole     11/20 1.5/2 0.55 6.5 Letermovir , Fluconazole     11/21 1.5/2 0.56  Letermovir , Fluconazole     11/22 1.5/2 0.57 6.2 Letermovir , Fluconazole       Please page service pharmacist with questions/clarifications.    Lavanda Dings, PharmD  Inpatient Hematology/Oncology Pharmacist

## 2024-06-23 LAB — BASIC METABOLIC PANEL
ANION GAP: 13 mmol/L (ref 5–14)
BLOOD UREA NITROGEN: <5 mg/dL — ABNORMAL LOW (ref 9–23)
CALCIUM: 8.6 mg/dL — ABNORMAL LOW (ref 8.7–10.4)
CHLORIDE: 109 mmol/L — ABNORMAL HIGH (ref 98–107)
CO2: 19 mmol/L — ABNORMAL LOW (ref 20.0–31.0)
CREATININE: 0.49 mg/dL — ABNORMAL LOW (ref 0.55–1.02)
EGFR CKD-EPI (2021) FEMALE: >90 mL/min/1.73m2 (ref >=60)
GLUCOSE RANDOM: 152 mg/dL (ref 70–179)
POTASSIUM: 3.9 mmol/L (ref 3.4–4.8)
SODIUM: 141 mmol/L (ref 135–145)

## 2024-06-23 LAB — CBC W/ AUTO DIFF
BASOPHILS ABSOLUTE COUNT: 0 10*9/L (ref 0.0–0.1)
BASOPHILS RELATIVE PERCENT: 0 %
EOSINOPHILS ABSOLUTE COUNT: 0 10*9/L (ref 0.0–0.5)
EOSINOPHILS RELATIVE PERCENT: 1.3 %
HEMATOCRIT: 21.4 % — ABNORMAL LOW (ref 34.0–44.0)
HEMOGLOBIN: 7.7 g/dL — ABNORMAL LOW (ref 11.3–14.9)
LYMPHOCYTES ABSOLUTE COUNT: 0.1 10*9/L — ABNORMAL LOW (ref 1.1–3.6)
LYMPHOCYTES RELATIVE PERCENT: 61.4 %
MEAN CORPUSCULAR HEMOGLOBIN CONC: 36 g/dL (ref 32.0–36.0)
MEAN CORPUSCULAR HEMOGLOBIN: 32.7 pg — ABNORMAL HIGH (ref 25.9–32.4)
MEAN CORPUSCULAR VOLUME: 90.9 fL (ref 77.6–95.7)
MEAN PLATELET VOLUME: 8.1 fL (ref 6.8–10.7)
MONOCYTES ABSOLUTE COUNT: 0 10*9/L — ABNORMAL LOW (ref 0.3–0.8)
MONOCYTES RELATIVE PERCENT: 30.6 %
NEUTROPHILS ABSOLUTE COUNT: 0 10*9/L — CL (ref 1.8–7.8)
NEUTROPHILS RELATIVE PERCENT: 6.7 %
PLATELET COUNT: 18 10*9/L — ABNORMAL LOW (ref 150–450)
RED BLOOD CELL COUNT: 2.35 10*12/L — ABNORMAL LOW (ref 3.95–5.13)
RED CELL DISTRIBUTION WIDTH: 13.5 % (ref 12.2–15.2)
WBC ADJUSTED: 0.1 10*9/L — CL (ref 3.6–11.2)

## 2024-06-23 LAB — MAGNESIUM: MAGNESIUM: 1.6 mg/dL (ref 1.6–2.6)

## 2024-06-23 MED ADMIN — famotidine (PEPCID) tablet 20 mg: 20 mg | ORAL | @ 15:00:00

## 2024-06-23 MED ADMIN — famotidine (PEPCID) tablet 20 mg: 20 mg | ORAL | @ 02:00:00

## 2024-06-23 MED ADMIN — escitalopram oxalate (LEXAPRO) tablet 10 mg: 10 mg | ORAL | @ 15:00:00

## 2024-06-23 MED ADMIN — loperamide (IMODIUM) capsule 2 mg: 2 mg | ORAL | @ 02:00:00

## 2024-06-23 MED ADMIN — insulin lispro (HumaLOG) injection 0-20 Units: 0-20 [IU] | SUBCUTANEOUS | @ 20:00:00

## 2024-06-23 MED ADMIN — insulin lispro (HumaLOG) injection 0-20 Units: 0-20 [IU] | SUBCUTANEOUS | @ 23:00:00

## 2024-06-23 MED ADMIN — insulin lispro (HumaLOG) injection 0-20 Units: 0-20 [IU] | SUBCUTANEOUS | @ 14:00:00

## 2024-06-23 MED ADMIN — fluconazole (DIFLUCAN) tablet 400 mg: 400 mg | ORAL | @ 15:00:00 | Stop: 2024-07-12

## 2024-06-23 MED ADMIN — letermovir (PREVYMIS) tablet 480 mg: 480 mg | ORAL | @ 02:00:00

## 2024-06-23 MED ADMIN — magnesium sulfate 2gm/50mL IVPB: 2 g | INTRAVENOUS | @ 09:00:00

## 2024-06-23 MED ADMIN — magnesium sulfate 2gm/50mL IVPB: 2 g | INTRAVENOUS | @ 12:00:00

## 2024-06-23 MED ADMIN — prochlorperazine (COMPAZINE) injection 10 mg: 10 mg | INTRAVENOUS | @ 03:00:00

## 2024-06-23 MED ADMIN — cetirizine (ZYRTEC) tablet 10 mg: 10 mg | ORAL | @ 15:00:00

## 2024-06-23 MED ADMIN — valACYclovir (VALTREX) tablet 500 mg: 500 mg | ORAL | @ 15:00:00

## 2024-06-23 MED ADMIN — valACYclovir (VALTREX) tablet 500 mg: 500 mg | ORAL | @ 02:00:00

## 2024-06-23 MED ADMIN — insulin lispro (HumaLOG) injection CORRECTIONAL 0-20 Units: 0-20 [IU] | SUBCUTANEOUS | @ 23:00:00

## 2024-06-23 MED ADMIN — insulin lispro (HumaLOG) injection CORRECTIONAL 0-20 Units: 0-20 [IU] | SUBCUTANEOUS | @ 20:00:00

## 2024-06-23 MED ADMIN — tacrolimus (PROGRAF) 1.5mg combo product: 1.5 mg | ORAL | @ 15:00:00

## 2024-06-23 MED ADMIN — levoFLOXacin (LEVAQUIN) tablet 500 mg: 500 mg | ORAL | @ 15:00:00 | Stop: 2024-07-12

## 2024-06-23 MED ADMIN — insulin glargine (LANTUS) injection BASAL 18 Units: 18 [IU] | SUBCUTANEOUS | @ 15:00:00

## 2024-06-23 MED ADMIN — OLANZapine (ZYPREXA) tablet 7.5 mg: 7.5 mg | ORAL | @ 02:00:00

## 2024-06-23 MED ADMIN — potassium chloride 20 mEq in 100 mL IVPB Premix: 20 meq | INTRAVENOUS | @ 09:00:00 | Stop: 2025-06-12

## 2024-06-23 MED ADMIN — tacrolimus (PROGRAF) capsule 2 mg: 2 mg | ORAL | @ 02:00:00

## 2024-06-23 MED ADMIN — lactated ringers bolus 1,000 mL: 1000 mL | INTRAVENOUS | @ 18:00:00 | Stop: 2024-06-23

## 2024-06-23 MED ADMIN — methotrexate (Preservative Free) injection: 5 mg/m2 | INTRAVENOUS | @ 18:00:00 | Stop: 2024-06-23

## 2024-06-23 MED ADMIN — ondansetron (ZOFRAN) tablet 8 mg: 8 mg | ORAL | @ 18:00:00

## 2024-06-23 NOTE — Progress Notes (Addendum)
 I personally saw, examined, and evaluated the patient, participating in the key portions of the service. I have reviewed the fellow's note and agree with the documented findings and plan. The note reflects my participation and input in the interval history, physical exam, and complex medical decision making. It was medically necessary for me to see the patient in the setting of high dose chemotherapy and allogeneic stem cell transplant.      [name] is a 48 year old woman with relapsed Ph+ ALL who is currently D+11 of her HLA matched unrelated donor allogenic stem cell transplant using reduced intensity conditioning with fludarabine  and melphalan  with tacrolimus  and methotrexate  based GVHD prophylaxis. Overnight there were no acute events. She continues to have intermittent loose stools that are responsive to prn Imodium . Remains without nausea but having spontaneous episodes of emesis. Received compazine  with none after. Appetite remains low with decreased intake, though otherwise doing well. Vitals remain stable and patient is afebrile. Exam stable from day prior with no concerns.   Ph+ ALL: In a complete molecular remission ahead of transplant following Blina/TKI salvage. Plan for BCR:ABL testing at D+30 post-transplant and TKI maintenance once adequately recovered.   BMT: Currently D+11 of her MUD alloSCT with Qol/[id number] RIC. Will plan to start G-CSF prophylaxis on D+12 after last dose of MTX today.   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  Tacrolimus  started on day -3 with plan for low-dose methotrexate  on days 1, 3, 6, and 11, which she will finish today.  Tacrolimus  trough goal is 5-10.   Immunosuppressed/ID: Continue fluconazole , levofloxacin  and Valtrex  prophylaxis.  We will start dapsone  for PJP prophylaxis upon platelet engraftment. She has a history of CMV viremia and is on Letermovir  (through at least D+200) and will monitor levels weekly (negative as of 06/16/24). 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   Hematology: We will transfuse for hemoglobin less than 7 and platelets less than 10.  CV: Developed cardiomyopathy at some point during her initial therapy with an EF as low as 45 to 50%.  Most recent echocardiogram shows her ejection fraction to be 55 to 60%. Continuing to hold home Losartan  with stable pressures.   Endocrinology: T2DM with steroid induced hyperglycemia. Appreciate Endocrinology assistance with management with improved glycemic control. Will continue Lantus  18U, mealtime (based on carb counting, though minimal use at this point) and SSI. Will need to continue fingerstick monitoring but if patient refuses will correlate Dexcom with serum glucose. Monitoring intake closely and will titrate anti-glycemic therapy as needed.   GI: Non-infectious diarrhea with negative C. Difficile testing. Will continue prn Imodium  for now (patient preference) with low threshold to schedule. Will continue Zyprexa  at 7.5mg  nightly. Continue prn Zofran  (frontline) for nausea with Compazine  for persistent symptoms.   FEN: Continuing to give fluids for decreased intake daily (1L today).   GU: Suspect dysuria and suprapubic discomfort secondary to low level BK viuria. UA not concerning for UTI but will send culture if symptoms persist. Will continue to monitor. No indication to treat BK at present.       Venetia Craft MD, PhD  Assistant Professor of Medicine, Division of Hematology  Bone Marrow Transplant and Cellular Therapy      BONE MARROW TRANSPLANT AND CELLULAR THERAPY PROGRESS NOTE  [name]  MRN: [id number]  Encounter Date: 06/23/24    Referring physician:  Dr. Toribio Bunker  BMT Attending MD: Dr. Jeaneen    Disease: Ph+B ALL  Current disease status: CR MRD Negative  Type of Transplant: RIC MUD Allo  Graft Source: Fresh PBSCs  Transplant Day: +11     Identifying Statement:  Patricia [name] is a 48 y.o. 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 Beda now presents for a matched unrelated donor stem cell transplant.      Interval History:  - Afebrile, VSS.   - Reported 1x emesis (improved with PRN compazine ) and 2x loose BM overnight. Received PRN imodium  at 2055, no BM thereafter.  Improved with as needed antiemetic.  Continue Imodium  PRN.   - Awaiting engraftment.  WBC 0.1, ANC 0.0. Hg 7.7, plt 18  - PO intake 1.4 L. Repeated 1L bolus today. Weight change: -0.454 kg (-1 lb)    Review of Systems:  A full system review was performed and was negative except as noted in the above interval history.    Temp:  [36.6 ??C (97.8 ??F)-37.5 ??C (99.5 ??F)] 37.1 ??C (98.8 ??F)  Pulse:  [84-105] 105  Resp:  [16-22] 22  BP: (95-118)/(63-81) 95/63  MAP (mmHg):  [73-92] 73  SpO2:  [96 %-99 %] 97 %    I/O last 3 completed shifts:  In: 3408.6 [P.O.:1860; I.V.:1548.6]  Out: 2050 [Urine:1850; Stool:200]  I/O this shift:  In: 700 [P.O.:700]  Out: 1150 [Urine:1150]    Last 5 Recorded Weights    06/18/24 2000 06/19/24 2059 06/20/24 1945 06/21/24 1608   Weight: 78.4 kg (172 lb 14.4 oz) 77.7 kg (171 lb 6.4 oz) 78 kg (171 lb 14.4 oz) 77.1 kg (169 lb 14.4 oz)    06/22/24 1633   Weight: 76.6 kg (168 lb 14.4 oz)     Weight change: -0.454 kg (-1 lb)    Test Results:   Reviewed in EPIC. Abnormal values discussed below.     Scheduled Meds:Scheduled Medications[1]  Continuous Infusions:Infusions Meds[2]  PRN Meds:.PRN Medications[3]    Physical Exam: no change from 06/20/24  General : Sitting in bed. Appears in no acute distress.  Central venous access: Line clean, dry, intact. No erythema or drainage noted.   ENT: Moist mucous membranes. Oropharhynx without erythema or exudate. Lesion to her lower gums, behind incisors.  Cardiovascular: Pulse normal rate, regularity and rhythm. S1 and S2 normal, without any murmur, rub, or gallop.  Lungs: Clear to auscultation bilaterally, without wheezes/crackles/rhonchi. Good air movement.   Skin: Pale. Warm, dry, intact. No rash noted.   Psychiatry: Alert and oriented to person, place, and time.   Gastrointestinal: Normoactive bowel sounds, abdomen soft, non-tender   Musculoskeletal/Extremities: Full range of motion in shoulder, elbow, hip knee, ankle, wrists and feet. No edema.   Neurologic: CNII-XII intact. Normal strength and sensation throughout    Assessment/Plan:    BMT: ALL  CR MRD Negative  HCT-CI (age adjusted) 69 (age, anxiety, reduced DLCO, HF (reduced EF), T2DM, CMV viremia)  Conditioning:.  1. Fludarabine  40 mg/m2 days -5, -4, -3, -2  2. Melphalan  100  mg/m2 day -1      Donor: 10/10, Female, ABO O-, CMV negative  Engraftment: G-CSF starting Day + 5 through WBC recovery (as defined as ANC 1.0 x 2 days or 3.0 x 1 day)  -Date of last G-CSF injection: TBD     GVHD prophylaxis:   1.Tacrolimus  starting on day -3 (goal  5-10 ng/mL)  2. Methotrexate  5 mg/m2 IVP on days +1, +3, +6 and +11  3.  ATG per [facility] standard dosing will not be included     Hem: Transfusion criteria: Transfuse 1 unit of PRBCs for hemoglobin <7 and 1 unit of platelets for Plt <10K or bleeding. [name] Patricia [name] does not have a history of transfusion reactions. Consent was obtained and documented on 06/05/24.    ID:   Prophylaxis:  -Antiviral: Valtrex  500 mg po BID started on admission and continue for 2 year      Letermovir  indicated as R/CMV + D/CMV - and with prior hx of CMV viremia, plan to continue on admission through at least day +200  -Antifungal: Fluconazole  400 mg po daily to start day 0 thorugh day +75  -Antibacterial: Levaquin  500 mg po daily to start day 0, continue while neutropenic  -PJP: Sulfa allergic. Dapsone  daily upon platelet engraftment, has tolerated this well in the past. Toxo PCR negative.   -11/7 stopped Dapsone  upon admission, restart with platelet engraftment.      Patient was screened for upper respiratory symptoms, including S/S of Covid-19. bmt prescreen symptoms: Patient reports upper respiratory symptoms.  bmtu rpp testing: RPP to be ordered and performed ahead of BMTU admission. Patient educated on continued need to inform medical team of any new symptoms.     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   Previously required Valcyte  treatment. 11/16 CMV viral load undetectable.  - Continue Letermovir  and Valtrex  BID through day +200 post SCT.      Recurrent Acute Sinusitis  Recurrent acute sinusitis with nasal congestion, frontal headache, and low-grade fever. Responded well to Augmentin .  -11/7 current sx resolving, started Augmentin  10 days (10/30-11/9)     Hypogammaglobulinemia  Monthly IVIG - last on 05/16/2024  - 11/7 check weekly IgG levels     GI:   GERD prophylaxis:  -Pepcid  BID     Nausea:  -Anti-emetics per BMT protocol.   - 11/14: Zyprexa  5 mg nightly  - 11/19: Zofran  sched TID, Increased Zyprexa  to 7.5mg  HS     Diarrhea:  -Antidiarrheals per BMT protocol    Renal: Creatinine normal. No issues.     Dysuria:   - 11/19: Noted suprapubic pain/tenderness. UA unrevealing. BK,Urine detected < 2.30 log.     FEN:    Electrolyte replacement per protocol  - 11/15 - 11/21: 1L NS bolus x1    Hepatic: Normal bilirubin. SOS prophylaxis not indicated with RIC regimen.   - 06/17/24: Rise in AST/ALT, monitor LFTs daily     CV: Followed by [facility]: Heart failure with mildly reduced ejection fraction (HFmrEF)   Hx of EF 45-50%, most recent Echo 05/23/24 improved to 55-60%  Hx of prolonged QTc, last EKG 489 follow closely with addition of QTc prolonging medications  -11/7 QTc 457. Check weekly EKGs (next on 11/14), added cardiac lytes   -11/8: 40 meq of K  -11/9: 20 meq of K     Hypertension:    - Home med Losartan  50 mg daily (held 11/18)    Vasovagal Episode:  -11/14: Lightheadedness with a fall overnight 2am. (-) stat head CT w/o contrast. Neuro exam intact  -11/16: Feeling well, denies N/V, HA/lightheaded.    Pulm: No pulmonary symptoms currently. Reduced DLCO      Endocrine:   Menses suppression: Lupron  last 07/26/22. No menses      Type 2 DM exacerbated by steroids: Followed by  [facility]. At home uses Dexcom for monitoring.  - 11/7 ACHS and SSI while inpatient. Lantus  25 units in AM. Can consider using Dexcom next week if numbers similar to hospital glucometer results   - 11/8: holding Lantus  due to morning POC 89  - 11/9: Blood glucose range 131 -189, resumed Lantus  at lower dose 10 Units  - 11/11: BG 200-400s in setting of steroids. Increased Lantus  to 15u.  - 11/12: BG 300s. Increased Lantus  to 18u.   - 11/16: BG range 288-363 last 24 hours, adjust SSI insulin  sensitivity factor  - 11/18: Appreciate endocrinology recs. Increased Lantus  20u. Placed on nutritional and correctional SSI.   - 11/20: Poor PO intake, decrease Lantus  to 18u.    Neuro/Pain:  - PRN tramadol  50 mg added. No oxy as this give patient migraines   - 11/8: Tramadol  was not helping, Morphine  with mild relief. Ordered dilaudid  0.5mg  q8 prn x 2 days for severe pain, removed tramadol  prn.  - 11/11: Tramadol  50mg  Q6H ordered PRN with goal to transition to oral medications for pain control.  - 11/12: Discontinued Dilaudid . Manage chronic hip/back pain with Tramadol  q6h PRN + heat therapy.     Psych: Followed by Palliative Care     Anxiety: Home med: Lexapro  10 mg daily   Reconnected with her local therapist   - If pt continues to endorse anxiety, may increase Lexapro  to 20 mg daily  - 11/7 added PRN ativan  0.5 mg Q8     Caregivers: Lives in [place], [place]  Primary caregiver: Jeanie Kruger (mom)- first 30 days at [facility] then home if doing well will stay longer if medically needed  Secondary caregiver: Devaughn Ranger (husband)     Consults: While inpatient, consider consults to music therapy, integrative medicine and massage therapy.       Gery Francis, MD  [facility] Bone Marrow Transplant and Cellular Therapy Program                   [1]    cetirizine   10 mg Oral Daily    escitalopram  oxalate  10 mg Oral Daily    famotidine   20 mg Oral BID    [START ON 06/24/2024] filgrastim   480 mcg Subcutaneous Q24H    fluconazole   400 mg Oral Daily    heparin , porcine (PF)  2 mL Intravenous Mon,Wed,Fri    insulin  glargine  18 Units Subcutaneous Daily    insulin  lispro  0-20 Units Subcutaneous 3xd Meals    insulin  lispro  0-20 Units Subcutaneous TID AC    letermovir   480 mg Oral Nightly    levoFLOXacin   500 mg Oral Daily    [Provider Hold] losartan   50 mg Oral Daily    OLANZapine   7.5 mg Oral Nightly    tacrolimus   1.5 mg Oral Daily    tacrolimus   2 mg Oral Nightly    valACYclovir   500 mg Oral BID   [2]    IP okay to treat      sodium chloride  20 mL/hr (06/07/24 1326)   [3] albuterol , aluminum-magnesium  hydroxide-simethicone, CETAPHIL, dexAMETHasone , dextrose  in water, diphenhydrAMINE , emollient combination no.92, EPINEPHrine  IM, famotidine  (PEPCID ) IV, glucagon , glucose, IP okay to treat, magic mouthwash oral **AND** lidocaine , loperamide , loperamide , LORazepam , magnesium  sulfate, methylPREDNISolone  sodium succinate , ondansetron , phenol, potassium chloride  in water, prochlorperazine  **OR** prochlorperazine , sodium chloride  0.9%, traMADol 

## 2024-06-23 NOTE — Plan of Care (Signed)
 Patient AOx4, VSS, afebrile. Patient received 1 PRN dose of Zofran  with LR bolus.  No falls or acute events this shift. Plan of Care reviewed with patient.  Safety measures in place of bed low with brakes locked, non-skid footwear on while out of bed, call bell within reach.        Problem: Adult Inpatient Plan of Care  Goal: Absence of Hospital-Acquired Illness or Injury  Outcome: Shift Focus  Intervention: Identify and Manage Fall Risk  Recent Flowsheet Documentation  Taken 06/23/2024 0720 by Toma Emmie RAMAN, RN  Safety Interventions:   bleeding precautions   chemotherapeutic agent precautions   environmental modification   fall reduction program maintained   infection management   isolation precautions   lighting adjusted for tasks/safety   low bed   neutropenic precautions   nonskid shoes/slippers when out of bed  Intervention: Prevent Skin Injury  Recent Flowsheet Documentation  Taken 06/23/2024 0940 by Toma Emmie RAMAN, RN  Positioning for Skin: Supine/Back  Taken 06/23/2024 0720 by Toma Emmie RAMAN, RN  Positioning for Skin: Supine/Back  Intervention: Prevent Infection  Recent Flowsheet Documentation  Taken 06/23/2024 0720 by Toma Emmie RAMAN, RN  Infection Prevention: cohorting utilized  Goal: Optimal Comfort and Wellbeing  Outcome: Shift Focus  Goal: Readiness for Transition of Care  Outcome: Shift Focus  Goal: Rounds/Family Conference  Outcome: Shift Focus

## 2024-06-23 NOTE — Plan of Care (Signed)
 Pt day +11 allo transplant. Patient A&O X4, VSS, afebrile throughout shift. PRNs for shift include imodium  for diarrhea and IV compazine  for vomiting. Pt had one episode of emesis. Magnesium  and potassium replacements given. No acute events throughout shift. Plan of care reviewed with patient and applicable visitors, safety measures in place throughout shift.         Problem: Adult Inpatient Plan of Care  Goal: Absence of Hospital-Acquired Illness or Injury  Intervention: Identify and Manage Fall Risk  Recent Flowsheet Documentation  Taken 06/22/2024 2030 by Zarah Carbon E, RN  Safety Interventions:   bed alarm   bleeding precautions   fall reduction program maintained   isolation precautions   low bed   neutropenic precautions   nonskid shoes/slippers when out of bed   lighting adjusted for tasks/safety  Intervention: Prevent Skin Injury  Recent Flowsheet Documentation  Taken 06/22/2024 2030 by Shannan Slinker E, RN  Positioning for Skin: Supine/Back  Device Skin Pressure Protection: adhesive use limited  Skin Protection: adhesive use limited  Intervention: Prevent Infection  Recent Flowsheet Documentation  Taken 06/22/2024 2030 by Kairyn Olmeda, Lorrayne E, RN  Infection Prevention:   cohorting utilized   environmental surveillance performed   equipment surfaces disinfected   hand hygiene promoted   personal protective equipment utilized   rest/sleep promoted   single patient room provided   visitors restricted/screened     Problem: Infection  Goal: Absence of Infection Signs and Symptoms  Intervention: Prevent or Manage Infection  Recent Flowsheet Documentation  Taken 06/22/2024 2030 by Senica Crall, Lorrayne E, RN  Infection Management: aseptic technique maintained  Isolation Precautions: protective precautions maintained     Problem: Stem Cell/Bone Marrow Transplant  Goal: Diarrhea Symptom Control  Intervention: Manage Diarrhea  Recent Flowsheet Documentation  Taken 06/22/2024 2030 by Sabree Nuon E, RN  Skin Protection: adhesive use limited  Goal: Improved Activity Tolerance  Intervention: Promote Improved Energy  Recent Flowsheet Documentation  Taken 06/22/2024 2030 by Jayron Maqueda, Lorrayne E, RN  Activity Management: up ad lib  Goal: Blood Counts Within Acceptable Range  Intervention: Monitor and Manage Hematologic Symptoms  Recent Flowsheet Documentation  Taken 06/22/2024 2030 by Lyrah Bradt, Lorrayne E, RN  Bleeding Precautions:   blood pressure closely monitored   coagulation study results reviewed   foot protection facilitated   gentle oral care promoted   monitored for signs of bleeding  Goal: Absence of Infection  Intervention: Prevent and Manage Infection  Recent Flowsheet Documentation  Taken 06/22/2024 2030 by Jahmya Onofrio E, RN  Infection Management: aseptic technique maintained  Infection Prevention:   cohorting utilized   environmental surveillance performed   equipment surfaces disinfected   hand hygiene promoted   personal protective equipment utilized   rest/sleep promoted   single patient room provided   visitors restricted/screened  Isolation Precautions: protective precautions maintained  Goal: Improved Oral Mucous Membrane Health and Integrity  Intervention: Promote Oral Comfort and Health  Recent Flowsheet Documentation  Taken 06/22/2024 2030 by Niam Nepomuceno, Lorrayne E, RN  Oral Mucous Membrane Protection: nonirritating oral fluids promoted     Problem: Fall Injury Risk  Goal: Absence of Fall and Fall-Related Injury  Intervention: Promote Scientist, Clinical (histocompatibility And Immunogenetics) Documentation  Taken 06/22/2024 2030 by Jaylina Ramdass, Lorrayne E, RN  Safety Interventions:   bed alarm   bleeding precautions   fall reduction program maintained   isolation precautions   low bed   neutropenic precautions   nonskid shoes/slippers when out of bed   lighting adjusted for tasks/safety  Problem: Diabetes  Goal: Optimal Functional Ability  Intervention: Optimize Functional Ability  Recent Flowsheet Documentation  Taken 06/22/2024 2030 by Qiana Landgrebe E, RN  Activity Management: up ad lib

## 2024-06-24 DIAGNOSIS — C9100 Acute lymphoblastic leukemia not having achieved remission: Principal | ICD-10-CM

## 2024-06-24 LAB — CBC W/ AUTO DIFF
BASOPHILS ABSOLUTE COUNT: 0 10*9/L (ref 0.0–0.1)
BASOPHILS RELATIVE PERCENT: 0.6 %
EOSINOPHILS ABSOLUTE COUNT: 0 10*9/L (ref 0.0–0.5)
EOSINOPHILS RELATIVE PERCENT: 0.4 %
HEMATOCRIT: 20.4 % — ABNORMAL LOW (ref 34.0–44.0)
HEMOGLOBIN: 7.1 g/dL — ABNORMAL LOW (ref 11.3–14.9)
LYMPHOCYTES ABSOLUTE COUNT: 0.1 10*9/L — ABNORMAL LOW (ref 1.1–3.6)
LYMPHOCYTES RELATIVE PERCENT: 44.4 %
MEAN CORPUSCULAR HEMOGLOBIN CONC: 35 g/dL (ref 32.0–36.0)
MEAN CORPUSCULAR HEMOGLOBIN: 32 pg (ref 25.9–32.4)
MEAN CORPUSCULAR VOLUME: 91.5 fL (ref 77.6–95.7)
MEAN PLATELET VOLUME: 8.1 fL (ref 6.8–10.7)
MONOCYTES ABSOLUTE COUNT: 0 10*9/L — ABNORMAL LOW (ref 0.3–0.8)
MONOCYTES RELATIVE PERCENT: 28.3 %
NEUTROPHILS ABSOLUTE COUNT: 0 10*9/L — CL (ref 1.8–7.8)
NEUTROPHILS RELATIVE PERCENT: 26.3 %
PLATELET COUNT: 17 10*9/L — ABNORMAL LOW (ref 150–450)
RED BLOOD CELL COUNT: 2.23 10*12/L — ABNORMAL LOW (ref 3.95–5.13)
RED CELL DISTRIBUTION WIDTH: 13.8 % (ref 12.2–15.2)
WBC ADJUSTED: 0.2 10*9/L — CL (ref 3.6–11.2)

## 2024-06-24 LAB — PROTIME-INR
INR: 1.18
PROTIME: 13.4 s — ABNORMAL HIGH (ref 9.9–12.6)

## 2024-06-24 LAB — BASIC METABOLIC PANEL
ANION GAP: 9 mmol/L (ref 5–14)
BLOOD UREA NITROGEN: 6 mg/dL — ABNORMAL LOW (ref 9–23)
BUN / CREAT RATIO: 9
CALCIUM: 8.8 mg/dL (ref 8.7–10.4)
CHLORIDE: 111 mmol/L — ABNORMAL HIGH (ref 98–107)
CO2: 25 mmol/L (ref 20.0–31.0)
CREATININE: 0.66 mg/dL (ref 0.55–1.02)
EGFR CKD-EPI (2021) FEMALE: >90 mL/min/1.73m2 (ref >=60)
GLUCOSE RANDOM: 105 mg/dL (ref 70–179)
POTASSIUM: 3.9 mmol/L (ref 3.4–4.8)
SODIUM: 145 mmol/L (ref 135–145)

## 2024-06-24 LAB — APTT
APTT: 28.4 s (ref 24.8–38.4)
HEPARIN CORRELATION: 0.2

## 2024-06-24 LAB — EBV QUANTITATIVE PCR, BLOOD: EBV VIRAL LOAD RESULT: NOT DETECTED

## 2024-06-24 LAB — PHOSPHORUS: PHOSPHORUS: 4.6 mg/dL (ref 2.4–5.1)

## 2024-06-24 LAB — CMV DNA, QUANTITATIVE, PCR: CMV VIRAL LD: NOT DETECTED

## 2024-06-24 LAB — HEPATIC FUNCTION PANEL
ALBUMIN: 3 g/dL — ABNORMAL LOW (ref 3.4–5.0)
ALKALINE PHOSPHATASE: 54 U/L (ref 46–116)
ALT (SGPT): 47 U/L (ref 10–49)
AST (SGOT): 30 U/L (ref <=34)
BILIRUBIN DIRECT: <0.1 mg/dL (ref 0.00–0.30)
BILIRUBIN TOTAL: 0.2 mg/dL — ABNORMAL LOW (ref 0.3–1.2)
PROTEIN TOTAL: 5.3 g/dL — ABNORMAL LOW (ref 5.7–8.2)

## 2024-06-24 LAB — LACTATE DEHYDROGENASE: LACTATE DEHYDROGENASE: 173 U/L (ref 120–246)

## 2024-06-24 LAB — SLIDE REVIEW

## 2024-06-24 LAB — TACROLIMUS LEVEL, TROUGH: TACROLIMUS, TROUGH: 9.7 ng/mL (ref 5.0–15.0)

## 2024-06-24 LAB — IONIZED CALCIUM VENOUS: CALCIUM IONIZED VENOUS (MG/DL): 4.54 mg/dL (ref 4.40–5.40)

## 2024-06-24 LAB — MAGNESIUM: MAGNESIUM: 1.7 mg/dL (ref 1.6–2.6)

## 2024-06-24 LAB — IGG: GAMMAGLOBULIN; IGG: 464 mg/dL — ABNORMAL LOW (ref 650–1600)

## 2024-06-24 MED ADMIN — famotidine (PEPCID) tablet 20 mg: 20 mg | ORAL | @ 02:00:00

## 2024-06-24 MED ADMIN — famotidine (PEPCID) tablet 20 mg: 20 mg | ORAL | @ 14:00:00

## 2024-06-24 MED ADMIN — escitalopram oxalate (LEXAPRO) tablet 10 mg: 10 mg | ORAL | @ 14:00:00

## 2024-06-24 MED ADMIN — fluconazole (DIFLUCAN) tablet 400 mg: 400 mg | ORAL | @ 14:00:00 | Stop: 2024-07-12

## 2024-06-24 MED ADMIN — letermovir (PREVYMIS) tablet 480 mg: 480 mg | ORAL | @ 02:00:00

## 2024-06-24 MED ADMIN — heparin, porcine (PF) 100 unit/mL injection 2 mL: 2 mL | INTRAVENOUS | @ 05:00:00

## 2024-06-24 MED ADMIN — magnesium sulfate 2gm/50mL IVPB: 2 g | INTRAVENOUS | @ 08:00:00

## 2024-06-24 MED ADMIN — magnesium sulfate 2gm/50mL IVPB: 2 g | INTRAVENOUS | @ 11:00:00

## 2024-06-24 MED ADMIN — cetirizine (ZYRTEC) tablet 10 mg: 10 mg | ORAL | @ 14:00:00

## 2024-06-24 MED ADMIN — valACYclovir (VALTREX) tablet 500 mg: 500 mg | ORAL | @ 02:00:00

## 2024-06-24 MED ADMIN — valACYclovir (VALTREX) tablet 500 mg: 500 mg | ORAL | @ 14:00:00

## 2024-06-24 MED ADMIN — insulin lispro (HumaLOG) injection CORRECTIONAL 0-20 Units: 0-20 [IU] | SUBCUTANEOUS | @ 22:00:00

## 2024-06-24 MED ADMIN — insulin lispro (HumaLOG) injection CORRECTIONAL 0-20 Units: 0-20 [IU] | SUBCUTANEOUS | @ 17:00:00

## 2024-06-24 MED ADMIN — tacrolimus (PROGRAF) 1.5mg combo product: 1.5 mg | ORAL | @ 14:00:00

## 2024-06-24 MED ADMIN — levoFLOXacin (LEVAQUIN) tablet 500 mg: 500 mg | ORAL | @ 14:00:00 | Stop: 2024-07-12

## 2024-06-24 MED ADMIN — OLANZapine (ZYPREXA) tablet 7.5 mg: 7.5 mg | ORAL | @ 02:00:00

## 2024-06-24 MED ADMIN — potassium chloride 20 mEq in 100 mL IVPB Premix: 20 meq | INTRAVENOUS | @ 08:00:00 | Stop: 2025-06-12

## 2024-06-24 MED ADMIN — tacrolimus (PROGRAF) capsule 2 mg: 2 mg | ORAL | @ 02:00:00

## 2024-06-24 MED ADMIN — insulin glargine (LANTUS) injection BASAL 12 Units: 12 [IU] | SUBCUTANEOUS | @ 16:00:00

## 2024-06-24 NOTE — Consults (Signed)
 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     Date of Transplant: 06/12/24      Prior Dosing Information: see table below     Source(s) of information used to determine prior to admission dosing: MAR    Goals:  Therapeutic Drug Levels  Tacrolimus  trough goal: 5-10 ng/mL    Additional Clinical Monitoring/Outcomes  Monitor renal function (SCr and urine output) and liver function (LFTs)  Monitor for signs/symptoms of adverse events (e.g., hyperglycemia, hyperkalemia, hypomagnesemia, hypertension, headache, tremor)    Previous Lab Results:  Tacrolimus , Trough   Date Value Ref Range Status   06/24/2024 9.7 5.0 - 15.0 ng/mL Final   06/22/2024 6.2 5.0 - 15.0 ng/mL Final   06/20/2024 6.5 5.0 - 15.0 ng/mL Final   06/18/2024 6.8 5.0 - 15.0 ng/mL Final   06/16/2024 3.1 (L) 5.0 - 15.0 ng/mL Final     Creatinine   Date Value Ref Range Status   06/23/2024 0.66 0.55 - 1.02 mg/dL Final   88/77/7974 9.50 (L) 0.55 - 1.02 mg/dL Final   88/77/7974 9.42 0.55 - 1.02 mg/dL Final      Result:  Tacrolimus  level from today was drawn appropriately     Pharmacokinetic Considerations and Significant Drug Interactions:  Concurrent CYP3A4 substrates/inhibitors: fluconazole  (moderate inhibitor, started 11/12), letermovir  (moderate inhibitor, initiated 05/2023)    Assessment/Plan:  Recommendation(s)  Trough continues to be therapeutic  Fluconazole  and Letermovir  have been at steady state and should not have a continued affect on tacrolimus  levels  SCr stable/WNL  Bili/LFTs from 11/24 WNL  Although trough within normal range, slightly increase from prior; SCr also mildly increased on today's labs  Given the above, plan to continue tacrolimus  1.5 mg qAM and 2 mg qPM and recheck in 2 days prior to adjusting the dose    Follow-up  Next level has been ordered on 11/26 at 0800.   A pharmacist will continue to monitor and recommend levels as appropriate    Longitudinal Dose Monitoring:  Date Dose (mg), Route AM Scr (mg/dL) Level  (ng/mL) Key Drug Interactions   11/9 3.5/3.5 0.51  Letermovir    11/10 3.5/3.5 0.44  Letermovir    11/11 2.5/--- 0.51 16.1 Letermovir    11/12 ---/--- 0.53 10.7 Letermovir , Fluconazole  started   11/13 2/2 0.52 -- Letermovir , Fluconazole    11/14 2/-- 0.55 9.4 Letermovir , Fluconazole    11/15 1.5/1.5 0.48  Letermovir , Fluconazole    11/16 1.5/2 0.55 3.1 Letermovir , Fluconazole     11/17 1.5/2 0.45  Letermovir , Fluconazole     11/18 1.5/2 0.48 6.8 Letermovir , Fluconazole     11/19 1.5/2 0.69  Letermovir , Fluconazole     11/20 1.5/2 0.55 6.5 Letermovir , Fluconazole     11/21 1.5/2 0.56  Letermovir , Fluconazole     11/22 1.5/2 0.57 6.2 Letermovir , Fluconazole     11/23 1.5/2 0.49  Letermovir , Fluconazole     11/24 1.5/2 0.66 9.7 Letermovir , Fluconazole       Please page service pharmacist with questions/clarifications.    [name], PharmD, BCPS, BCOP, CPP  Bone Marrow Transplant and Cellular Therapy

## 2024-06-24 NOTE — Plan of Care (Signed)
 Day+12 Allo SCT RIC Flu Mel  Shift Summary  Pancytopenic, maintained protective, bleeding and falls precautions.   Blood glucose levels increased over the shift, with two refusals of insulin  lispro after carbohydrate intake and notification of the MD.   Correctional insulin  given per SSI as ordered.   Overall, blood glucose management was challenged by repeated insulin  refusals and rising glucose values.  No CHG documented for several days.  Discussed CHG treatment with patient who states she plans to use this PM.  This has been a trend in her documentation, will follow up with team and night shift regarding encouragement and documentation.      Problem: Stem Cell/Bone Marrow Transplant  Goal: Blood Counts Within Acceptable Range  Outcome: Shift Focus  Intervention: Monitor and Manage Hematologic Symptoms  Recent Flowsheet Documentation  Taken 06/24/2024 0715 by Melanie Burnard HERO, RN  Bleeding Precautions: monitored for signs of bleeding  Goal: Nausea and Vomiting Symptom Relief  Outcome: Shift Focus     Problem: Diabetes  Goal: Blood Glucose Level Within Target Range  Outcome: Shift Focus

## 2024-06-24 NOTE — Plan of Care (Signed)
 Shift Summary  Pt day +12 allo SCT, AO x4 with VSS overnight. No complaints of pain, nausea or diarrhea. Did not meet order parameters for blood products, received 4 gm mag and 20 mEq KCl. Glucose monitored with CGM, had one BG <70 at beginning of shift but pt self corrected it with own drink and and remained WNL remainder of shift.     Absence of Hospital-Acquired Illness or Injury: No new hospital-acquired injuries or illnesses were documented during the shift; safety interventions and environmental precautions were consistently maintained, and skin protection measures were in place, though bruising was noted on the skin.     Optimal Comfort and Wellbeing: Pain scores remained at 0 throughout the shift, and no comfort interventions were required.     Absence of Infection Signs and Symptoms: Temperature remained stable and within normal range, and infection prevention protocols including aseptic technique and neutropenic precautions were maintained.

## 2024-06-24 NOTE — Progress Notes (Signed)
 Patricia [name] has been contacted in regards to their refill of Prevymis . At this time, they have declined refill due to medication being on hold. Refill assessment call date has been updated per the patient's request.

## 2024-06-24 NOTE — Progress Notes (Signed)
 BONE MARROW TRANSPLANT AND CELLULAR THERAPY PROGRESS NOTE    [name]  MRN: [id number]  Encounter Date: 06/24/24    Referring physician:  Dr. Toribio Bunker  BMT Attending MD: Dr. Jeaneen    Disease: Ph+B ALL  Current disease status: CR MRD Negative  Type of Transplant: RIC MUD Allo  Graft Source: Fresh PBSCs  Transplant Day: +12     Identifying Statement:  Patricia [name] is a 48 y.o. 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 Sharmila now presents for a matched unrelated donor stem cell transplant.      Interval History:  - Afebrile, VSS.   - Blood sugars in the 50s yesterday afternoon, attributed to overestimating nutritional insulin . Fasting glucose 110 this AM. Decrease Lantus  to 12u today.   - Continues with nausea and loose stools. Reports 3 loose stools in the last 24h. Symptoms stable on current regimen.   - Reported PO intake 2.4L in last 24h. (1.6L charted). Weight change: 1.95 kg (4 lb 4.8 oz)  - Oral discomfort stable this AM.     Review of Systems:  A full system review was performed and was negative except as noted in the above interval history.    Temp:  [36.6 ??C (97.8 ??F)-37.2 ??C (99 ??F)] 37.2 ??C (99 ??F)  Pulse:  [84-105] 93  Resp:  [16-22] 18  BP: (95-121)/(63-87) 121/81  MAP (mmHg):  [73-96] 92  SpO2:  [96 %-100 %] 97 %    I/O last 3 completed shifts:  In: 4635.6 [P.O.:2060; I.V.:2575.6]  Out: 2750 [Urine:2550; Stool:200]  I/O this shift:  In: 500 [P.O.:500]  Out: 900 [Urine:900]    Last 5 Recorded Weights    06/19/24 2059 06/20/24 1945 06/21/24 1608 06/22/24 1633   Weight: 77.7 kg (171 lb 6.4 oz) 78 kg (171 lb 14.4 oz) 77.1 kg (169 lb 14.4 oz) 76.6 kg (168 lb 14.4 oz)    06/23/24 1945   Weight: 78.6 kg (173 lb 3.2 oz)     Weight change: 1.95 kg (4 lb 4.8 oz)    Test Results:   Reviewed in EPIC. Abnormal values discussed below.     Scheduled Meds:Scheduled Medications[1]  Continuous Infusions:Infusions Meds[2]  PRN Meds:.PRN Medications[3]    Physical Exam:   General : Sitting in bed. Appears in no acute distress.  Central venous access: Line clean, dry, intact. No erythema or drainage noted.   ENT: Moist mucous membranes. Oropharhynx without erythema or exudate. Lesion to her lower gums, behind incisors (improving).  Cardiovascular: Pulse normal rate, regularity and rhythm. S1 and S2 normal, without any murmur, rub, or gallop.  Lungs: Clear to auscultation bilaterally, without wheezes/crackles/rhonchi. Good air movement.   Skin: Pale. Warm, dry, intact. No rash noted.   Psychiatry: Alert and oriented to person, place, and time.   Gastrointestinal: Normoactive bowel sounds, abdomen soft, non-tender   Musculoskeletal/Extremities: Full range of motion in shoulder, elbow, hip knee, ankle, wrists and feet. No edema.   Neurologic: CNII-XII intact. Normal strength and sensation throughout    Assessment/Plan:    BMT: ALL  CR MRD Negative  HCT-CI (age adjusted) 20 (age, anxiety, reduced DLCO, HF (reduced EF), T2DM, CMV viremia)  Conditioning:.  1. Fludarabine  40 mg/m2 days -5, -4, -3, -2  2. Melphalan  100  mg/m2 day -1      Donor: 10/10, Female, ABO O-, CMV negative  Engraftment: G-CSF starting Day + 5 through WBC recovery (as defined as ANC 1.0 x 2 days or 3.0 x  1 day)  -Date of last G-CSF injection: TBD     GVHD prophylaxis:   1.Tacrolimus  starting on day -3 (goal 5-10 ng/mL)  2. Methotrexate  5 mg/m2 IVP on days +1, +3, +6 and +11  3.  ATG per [facility] standard dosing will not be included     Hem: Transfusion criteria: Transfuse 1 unit of PRBCs for hemoglobin <7 and 1 unit of platelets for Plt <10K or bleeding. [name] [name] does not have a history of transfusion reactions. Consent was obtained and documented on 06/05/24.    ID:   Prophylaxis:  -Antiviral: Valtrex  500 mg po BID started on admission and continue for 2 year      Letermovir  indicated as R/CMV + D/CMV - and with prior hx of CMV viremia, plan to continue on admission through at least day +200  -Antifungal: Fluconazole  400 mg po daily to start day 0 thorugh day +75  -Antibacterial: Levaquin  500 mg po daily to start day 0, continue while neutropenic  -PJP: Sulfa allergic. Dapsone  daily upon platelet engraftment, has tolerated this well in the past. Toxo PCR negative.   -11/7 stopped Dapsone  upon admission, restart with platelet engraftment.      Patient was screened for upper respiratory symptoms, including S/S of Covid-19. bmt prescreen symptoms: Patient reports upper respiratory symptoms.  bmtu rpp testing: RPP to be ordered and performed ahead of BMTU admission. Patient educated on continued need to inform medical team of any new symptoms.     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   Previously required Valcyte  treatment. 11/16 CMV viral load undetectable.  - Continue Letermovir  and Valtrex  BID through day +200 post SCT.      Recurrent Acute Sinusitis  Recurrent acute sinusitis with nasal congestion, frontal headache, and low-grade fever. Responded well to Augmentin .  -11/7 current sx resolving, started Augmentin  10 days (10/30-11/9)     Hypogammaglobulinemia   - Monthly IVIG - last on 05/16/2024   - Check weekly IgG levels   - IgG 464 11/24. Plan for IVIG at discharge    GI:   GERD prophylaxis:  -Pepcid  BID     Nausea:  -Anti-emetics per BMT protocol.   - 11/14: Zyprexa  5 mg nightly  - 11/19: Zofran  sched TID, Increased Zyprexa  to 7.5mg  HS     Diarrhea:  -Antidiarrheals per BMT protocol    Renal: Creatinine normal. No issues.     Dysuria:   - 11/19: Noted suprapubic pain/tenderness. UA unrevealing. BK,Urine detected < 2.30 log.     FEN:    Electrolyte replacement per protocol  - 11/15 - 11/21: 1L NS bolus x1    Hepatic: Normal bilirubin. SOS prophylaxis not indicated with RIC regimen.   - 06/17/24: Rise in AST/ALT, monitor LFTs daily     CV: Followed by [facility] Cardiology: Heart failure with mildly reduced ejection fraction (HFmrEF)   Hx of EF 45-50%, most recent Echo 05/23/24 improved to 55-60%  Hx of prolonged QTc, last EKG 489 follow closely with addition of QTc prolonging medications  -11/7 QTc 457. Check weekly EKGs (next on 11/14), added cardiac lytes   -11/8: 40 meq of K  -11/9: 20 meq of K     Hypertension:    - Home med Losartan  50 mg daily (held 11/18)    Vasovagal Episode:  -11/14: Lightheadedness with a fall overnight 2am. (-) stat head CT w/o contrast. Neuro exam intact  -11/16: Feeling well, denies N/V, HA/lightheaded.  Pulm: No pulmonary symptoms currently. Reduced DLCO      Endocrine:   Menses suppression: Lupron  last 07/26/22. 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 At home uses Dexcom for monitoring.  - 11/7 ACHS and SSI while inpatient. Lantus  25 units in AM. Can consider using Dexcom next week if numbers similar to hospital glucometer results   - 11/8: holding Lantus  due to morning POC 89  - 11/9: Blood glucose range 131 -189, resumed Lantus  at lower dose 10 Units  - 11/11: BG 200-400s in setting of steroids. Increased Lantus  to 15u.  - 11/12: BG 300s. Increased Lantus  to 18u.   - 11/16: BG range 288-363 last 24 hours, adjust SSI insulin  sensitivity factor  - 11/18: Appreciate endocrinology recs. Increased Lantus  20u. Placed on nutritional and correctional SSI.   - 11/20: Poor PO intake, decrease Lantus  to 18u.  - 11/24: Decreased PO intake, decrease Lantus  to 12u.    Neuro/Pain:  - PRN tramadol  50 mg added. No oxy as this give patient migraines   - 11/8: Tramadol  was not helping, Morphine  with mild relief. Ordered dilaudid  0.5mg  q8 prn x 2 days for severe pain, removed tramadol  prn.  - 11/11: Tramadol  50mg  Q6H ordered PRN with goal to transition to oral medications for pain control.  - 11/12: Discontinued Dilaudid . Manage chronic hip/back pain with Tramadol  q6h PRN + heat therapy.     Psych: Followed by Palliative Care     Anxiety: Home med: Lexapro  10 mg daily   Reconnected with her local therapist   - If pt continues to endorse anxiety, may increase Lexapro  to 20 mg daily  - 11/7 added PRN ativan  0.5 mg Q8     Caregivers: Lives in [place], [place]  Primary caregiver: Jeanie Lawhorne (mom)- first 30 days at [facility] then home if doing well will stay longer if medically needed  Secondary caregiver: Devaughn Ranger (husband)     Consults: While inpatient, consider consults to music therapy, integrative medicine and massage therapy.       Huan Pollok Jo-an Elva Levy, ACNP  [place] [facility]                   [1]    cetirizine   10 mg Oral Daily    escitalopram  oxalate  10 mg Oral Daily    famotidine   20 mg Oral BID    filgrastim   480 mcg Subcutaneous Q24H    fluconazole   400 mg Oral Daily    heparin , porcine (PF)  2 mL Intravenous Mon,Wed,Fri    insulin  glargine  12 Units Subcutaneous Daily    insulin  lispro  0-20 Units Subcutaneous 3xd Meals    insulin  lispro  0-20 Units Subcutaneous TID AC    letermovir   480 mg Oral Nightly    levoFLOXacin   500 mg Oral Daily    [Provider Hold] losartan   50 mg Oral Daily    OLANZapine   7.5 mg Oral Nightly    tacrolimus   1.5 mg Oral Daily    tacrolimus   2 mg Oral Nightly    valACYclovir   500 mg Oral BID   [2]    IP okay to treat      sodium chloride  20 mL/hr (06/07/24 1326)   [3] albuterol , aluminum-magnesium  hydroxide-simethicone, CETAPHIL, dexAMETHasone , dextrose  in water, diphenhydrAMINE , emollient combination no.92, EPINEPHrine  IM, famotidine  (PEPCID ) IV, glucagon , glucose, IP okay to treat, magic mouthwash oral **AND** lidocaine , loperamide , loperamide , LORazepam ,  magnesium  sulfate, methylPREDNISolone  sodium succinate , ondansetron , phenol, potassium chloride  in water, prochlorperazine  **OR** prochlorperazine , sodium chloride  0.9%, traMADol 

## 2024-06-25 LAB — BASIC METABOLIC PANEL
ANION GAP: 11 mmol/L (ref 5–14)
BLOOD UREA NITROGEN: 9 mg/dL (ref 9–23)
BUN / CREAT RATIO: 16
CALCIUM: 9 mg/dL (ref 8.7–10.4)
CHLORIDE: 108 mmol/L — ABNORMAL HIGH (ref 98–107)
CO2: 26 mmol/L (ref 20.0–31.0)
CREATININE: 0.58 mg/dL (ref 0.55–1.02)
EGFR CKD-EPI (2021) FEMALE: >90 mL/min/1.73m2 (ref >=60)
GLUCOSE RANDOM: 172 mg/dL (ref 70–179)
POTASSIUM: 4.1 mmol/L (ref 3.4–4.8)
SODIUM: 145 mmol/L (ref 135–145)

## 2024-06-25 LAB — CBC W/ AUTO DIFF
BASOPHILS ABSOLUTE COUNT: 0 10*9/L (ref 0.0–0.1)
BASOPHILS RELATIVE PERCENT: 1.6 %
EOSINOPHILS ABSOLUTE COUNT: 0 10*9/L (ref 0.0–0.5)
EOSINOPHILS RELATIVE PERCENT: 0.1 %
HEMATOCRIT: 22.2 % — ABNORMAL LOW (ref 34.0–44.0)
HEMOGLOBIN: 7.8 g/dL — ABNORMAL LOW (ref 11.3–14.9)
LYMPHOCYTES ABSOLUTE COUNT: 0.1 10*9/L — ABNORMAL LOW (ref 1.1–3.6)
LYMPHOCYTES RELATIVE PERCENT: 28.7 %
MEAN CORPUSCULAR HEMOGLOBIN CONC: 35.2 g/dL (ref 32.0–36.0)
MEAN CORPUSCULAR HEMOGLOBIN: 32.3 pg (ref 25.9–32.4)
MEAN CORPUSCULAR VOLUME: 91.9 fL (ref 77.6–95.7)
MEAN PLATELET VOLUME: 8.9 fL (ref 6.8–10.7)
MONOCYTES ABSOLUTE COUNT: 0 10*9/L — ABNORMAL LOW (ref 0.3–0.8)
MONOCYTES RELATIVE PERCENT: 11.3 %
NEUTROPHILS ABSOLUTE COUNT: 0.2 10*9/L — CL (ref 1.8–7.8)
NEUTROPHILS RELATIVE PERCENT: 58.3 %
PLATELET COUNT: 21 10*9/L — ABNORMAL LOW (ref 150–450)
RED BLOOD CELL COUNT: 2.41 10*12/L — ABNORMAL LOW (ref 3.95–5.13)
RED CELL DISTRIBUTION WIDTH: 14 % (ref 12.2–15.2)
WBC ADJUSTED: 0.3 10*9/L — CL (ref 3.6–11.2)

## 2024-06-25 LAB — MAGNESIUM: MAGNESIUM: 1.6 mg/dL (ref 1.6–2.6)

## 2024-06-25 LAB — SLIDE REVIEW

## 2024-06-25 MED ORDER — FAMOTIDINE 20 MG TABLET
ORAL_TABLET | Freq: Two times a day (BID) | ORAL | 0 refills | 14.00000 days
Start: 2024-06-25 — End: ?

## 2024-06-25 MED ORDER — VALACYCLOVIR 500 MG TABLET
ORAL_TABLET | Freq: Two times a day (BID) | ORAL | 11 refills | 30.00000 days
Start: 2024-06-25 — End: ?

## 2024-06-25 MED ORDER — OLANZAPINE 5 MG TABLET
ORAL_TABLET | Freq: Every evening | ORAL | 0 refills | 7.00000 days
Start: 2024-06-25 — End: ?

## 2024-06-25 MED ORDER — MAGNESIUM OXIDE-MAGNESIUM AMINO ACID CHELATE 133 MG TABLET
ORAL_TABLET | Freq: Two times a day (BID) | ORAL | 1 refills | 30.00000 days
Start: 2024-06-25 — End: ?

## 2024-06-25 MED ADMIN — famotidine (PEPCID) tablet 20 mg: 20 mg | ORAL | @ 02:00:00

## 2024-06-25 MED ADMIN — famotidine (PEPCID) tablet 20 mg: 20 mg | ORAL | @ 13:00:00

## 2024-06-25 MED ADMIN — escitalopram oxalate (LEXAPRO) tablet 10 mg: 10 mg | ORAL | @ 13:00:00

## 2024-06-25 MED ADMIN — magnesium oxide-Mg AA chelate (Magnesium Plus Protein) 1 tablet: 1 | ORAL | @ 18:00:00

## 2024-06-25 MED ADMIN — insulin lispro (HumaLOG) injection 0-20 Units: 0-20 [IU] | SUBCUTANEOUS | @ 16:00:00

## 2024-06-25 MED ADMIN — fluconazole (DIFLUCAN) tablet 400 mg: 400 mg | ORAL | @ 13:00:00 | Stop: 2024-07-12

## 2024-06-25 MED ADMIN — letermovir (PREVYMIS) tablet 480 mg: 480 mg | ORAL | @ 02:00:00

## 2024-06-25 MED ADMIN — magnesium sulfate 2gm/50mL IVPB: 2 g | INTRAVENOUS | @ 11:00:00

## 2024-06-25 MED ADMIN — magnesium sulfate 2gm/50mL IVPB: 2 g | INTRAVENOUS | @ 08:00:00

## 2024-06-25 MED ADMIN — cetirizine (ZYRTEC) tablet 10 mg: 10 mg | ORAL | @ 13:00:00

## 2024-06-25 MED ADMIN — valACYclovir (VALTREX) tablet 500 mg: 500 mg | ORAL | @ 02:00:00

## 2024-06-25 MED ADMIN — valACYclovir (VALTREX) tablet 500 mg: 500 mg | ORAL | @ 13:00:00

## 2024-06-25 MED ADMIN — insulin lispro (HumaLOG) injection CORRECTIONAL 0-20 Units: 0-20 [IU] | SUBCUTANEOUS | @ 18:00:00

## 2024-06-25 MED ADMIN — tacrolimus (PROGRAF) 1.5mg combo product: 1.5 mg | ORAL | @ 13:00:00

## 2024-06-25 MED ADMIN — levoFLOXacin (LEVAQUIN) tablet 500 mg: 500 mg | ORAL | @ 13:00:00 | Stop: 2024-07-12

## 2024-06-25 MED ADMIN — OLANZapine (ZYPREXA) tablet 7.5 mg: 7.5 mg | ORAL | @ 02:00:00

## 2024-06-25 MED ADMIN — tacrolimus (PROGRAF) capsule 2 mg: 2 mg | ORAL | @ 02:00:00

## 2024-06-25 MED ADMIN — insulin glargine (LANTUS) injection BASAL 12 Units: 12 [IU] | SUBCUTANEOUS | @ 13:00:00

## 2024-06-25 MED ADMIN — filgrastim-ayow (RELEUKO) injection syringe 480 mcg: 480 ug | SUBCUTANEOUS | @ 02:00:00

## 2024-06-25 MED ADMIN — lactated ringers bolus 500 mL: 500 mL | INTRAVENOUS | @ 20:00:00 | Stop: 2024-06-25

## 2024-06-25 NOTE — Progress Notes (Signed)
 BONE MARROW TRANSPLANT AND CELLULAR THERAPY PROGRESS NOTE    [name]  MRN: [id number]  Encounter Date: 06/25/24    Referring physician:  Dr. Toribio Bunker  BMT Attending MD: Dr. Jeaneen    Disease: Ph+B ALL  Current disease status: CR MRD Negative  Type of Transplant: RIC MUD Allo  Graft Source: Fresh PBSCs  Transplant Day: +13     Identifying Statement:  Patricia [name] is a 48 y.o. 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 Kaeya now presents for a matched unrelated donor stem cell transplant.      Interval History:  - [name]. Afebrile, VSS.   - Blood sugars ranged from 370-147 in the last 24 hours. Continue Lantus  12u.  - Nausea, Diarrhea stable. Will taper Zyprexa  to 5mg  tonight in anticipation for discharge. Continue Imodium  PRN.  - PO intake 1.5L in last 24h. Supplement with 500ml LR today. Weight change: -0.726 kg (-1 lb 9.6 oz)   - ANC 0.2 today. Continue G-CSF.     Review of Systems:  A full system review was performed and was negative except as noted in the above interval history.    Temp:  [36.7 ??C (98 ??F)-37.1 ??C (98.7 ??F)] 36.7 ??C (98.1 ??F)  Pulse:  [84-109] 109  Resp:  [16-18] 17  BP: (103-120)/(59-81) 119/81  MAP (mmHg):  [73-94] 94  SpO2:  [98 %-99 %] 99 %    I/O last 3 completed shifts:  In: 1964 [P.O.:1840; I.V.:124]  Out: 3900 [Urine:3900]  I/O this shift:  In: 200 [P.O.:200]  Out: 400 [Urine:400]    Last 5 Recorded Weights    06/20/24 1945 06/21/24 1608 06/22/24 1633 06/23/24 1945   Weight: 78 kg (171 lb 14.4 oz) 77.1 kg (169 lb 14.4 oz) 76.6 kg (168 lb 14.4 oz) 78.6 kg (173 lb 3.2 oz)    06/24/24 1625   Weight: 77.8 kg (171 lb 9.6 oz)     Weight change: -0.726 kg (-1 lb 9.6 oz)    Test Results:   Reviewed in EPIC. Abnormal values discussed below.     Scheduled Meds:Scheduled Medications[1]  Continuous Infusions:Infusions Meds[2]  PRN Meds:.PRN Medications[3]    Physical Exam: no change since 06/24/2024  General : Sitting in bed. Appears in no acute distress.  Central venous access: Line clean, dry, intact. No erythema or drainage noted.   ENT: Moist mucous membranes. Oropharhynx without erythema or exudate. Lesion to her lower gums, behind incisors (improving).  Cardiovascular: Pulse normal rate, regularity and rhythm. S1 and S2 normal, without any murmur, rub, or gallop.  Lungs: Clear to auscultation bilaterally, without wheezes/crackles/rhonchi. Good air movement.   Skin: Pale. Warm, dry, intact. No rash noted.   Psychiatry: Alert and oriented to person, place, and time.   Gastrointestinal: Normoactive bowel sounds, abdomen soft, non-tender   Musculoskeletal/Extremities: Full range of motion in shoulder, elbow, hip knee, ankle, wrists and feet. No edema.   Neurologic: CNII-XII intact. Normal strength and sensation throughout    Assessment/Plan:    BMT: ALL  CR MRD Negative  HCT-CI (age adjusted) 19 (age, anxiety, reduced DLCO, HF (reduced EF), T2DM, CMV viremia)  Conditioning:.  1. Fludarabine  40 mg/m2 days -5, -4, -3, -2  2. Melphalan  100  mg/m2 day -1      Donor: 10/10, Female, ABO O-, CMV negative  Engraftment: G-CSF starting Day + 5 through WBC recovery (as defined as ANC 1.0 x 2 days or 3.0 x 1 day)  -Date of last G-CSF injection:  TBD     GVHD prophylaxis:   1.Tacrolimus  starting on day -3 (goal 5-10 ng/mL)  2. Methotrexate  5 mg/m2 IVP on days +1, +3, +6 and +11  3.  ATG per [facility] standard dosing will not be included     Hem: Transfusion criteria: Transfuse 1 unit of PRBCs for hemoglobin <7 and 1 unit of platelets for Plt <10K or bleeding. [name] [name] does not have a history of transfusion reactions. Consent was obtained and documented on 06/05/24.    ID:   Prophylaxis:  -Antiviral: Valtrex  500 mg po BID started on admission and continue for 2 year      Letermovir  indicated as R/CMV + D/CMV - and with prior hx of CMV viremia, plan to continue on admission through at least day +200  -Antifungal: Fluconazole  400 mg po daily to start day 0 thorugh day +75  -Antibacterial: Levaquin  500 mg po daily to start day 0, continue while neutropenic  -PJP: Sulfa allergic. Dapsone  daily upon platelet engraftment, has tolerated this well in the past. Toxo PCR negative.   -11/7 stopped Dapsone  upon admission, restart with platelet engraftment.      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   Previously required Valcyte  treatment. 11/16 CMV viral load undetectable.  - Continue Letermovir  and Valtrex  BID through day +200 post SCT.      Recurrent Acute Sinusitis  Recurrent acute sinusitis with nasal congestion, frontal headache, and low-grade fever. Responded well to Augmentin .  -11/7 current sx resolving, started Augmentin  10 days (10/30-11/9)     Hypogammaglobulinemia   - Monthly IVIG - last on 05/16/2024   - Check weekly IgG levels   - IgG 464 11/24. Plan for IVIG at discharge    GI:   GERD prophylaxis:  -Pepcid  BID     Nausea:  -Anti-emetics per BMT protocol.   - 11/14: Zyprexa  5 mg nightly  - 11/19: Zofran  sched TID, Increased Zyprexa  to 7.5mg  HS  - 11/24: Taper Zyprexa  5mg  HS     Diarrhea:  -Antidiarrheals per BMT protocol    Renal: Creatinine normal. No issues.     Dysuria:   - 11/19: Noted suprapubic pain/tenderness. UA unrevealing. BK,Urine detected < 2.30 log.     FEN:    Electrolyte replacement per protocol  - 11/15 - 11/21: 1L NS bolus x1    Hypomagnesemia   - Start Mag Chelate 1 tab BID    Hepatic: Normal bilirubin. SOS prophylaxis not indicated with RIC regimen.   - 06/17/24: Rise in AST/ALT, monitor LFTs daily     CV: Followed by [facility] Cardiology: Heart failure with mildly reduced ejection fraction (HFmrEF)   Hx of EF 45-50%, most recent Echo 05/23/24 improved to 55-60%  Hx of prolonged QTc, last EKG 489 follow closely with addition of QTc prolonging medications  -11/7 QTc 457. Check weekly EKGs (next on 11/14), added cardiac lytes   -11/8: 40 meq of K  -11/9: 20 meq of K     Hypertension:    - Home med Losartan  50 mg daily (held 11/18)    Vasovagal Episode:  -11/14: Lightheadedness with a fall overnight 2am. (-) stat head CT w/o contrast. Neuro exam intact  -11/16: Feeling well, denies N/V, HA/lightheaded.    Pulm: No pulmonary symptoms currently. Reduced DLCO      Endocrine:   Menses suppression: Lupron  last 07/26/22. 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 At  home uses Dexcom for monitoring.  - 11/7 ACHS and SSI while inpatient. Lantus  25 units in AM. Can consider using Dexcom next week if numbers similar to hospital glucometer results   - 11/8: holding Lantus  due to morning POC 89  - 11/9: Blood glucose range 131 -189, resumed Lantus  at lower dose 10 Units  - 11/11: BG 200-400s in setting of steroids. Increased Lantus  to 15u.  - 11/12: BG 300s. Increased Lantus  to 18u.   - 11/16: BG range 288-363 last 24 hours, adjust SSI insulin  sensitivity factor  - 11/18: Appreciate endocrinology recs. Increased Lantus  20u. Placed on nutritional and correctional SSI.   - 11/20: Poor PO intake, decrease Lantus  to 18u.  - 11/24: Decreased PO intake, decrease Lantus  to 12u.    Neuro/Pain:  - PRN tramadol  50 mg added. No oxy as this give patient migraines   - 11/8: Tramadol  was not helping, Morphine  with mild relief. Ordered dilaudid  0.5mg  q8 prn x 2 days for severe pain, removed tramadol  prn.  - 11/11: Tramadol  50mg  Q6H ordered PRN with goal to transition to oral medications for pain control.  - 11/12: Discontinued Dilaudid . Manage chronic hip/back pain with Tramadol  q6h PRN + heat therapy.     Psych: Followed by Palliative Care     Anxiety: Home med: Lexapro  10 mg daily   Reconnected with her local therapist   - If pt continues to endorse anxiety, may increase Lexapro  to 20 mg daily  - 11/7 added PRN ativan  0.5 mg Q8     Caregivers: Lives in [place], [place]  Primary caregiver: Jeanie Wiltgen (mom)- first 30 days at [facility] then home if doing well will stay longer if medically needed  Secondary caregiver: Devaughn Ranger (husband) Consults: While inpatient, consider consults to music therapy, integrative medicine and massage therapy.       Neziah Braley Jo-an Elva Levy, ACNP  [place] [facility]                     [1]    cetirizine   10 mg Oral Daily    escitalopram  oxalate  10 mg Oral Daily    famotidine   20 mg Oral BID    filgrastim   480 mcg Subcutaneous Q24H    fluconazole   400 mg Oral Daily    heparin , porcine (PF)  2 mL Intravenous Mon,Wed,Fri    insulin  glargine  12 Units Subcutaneous Daily    insulin  lispro  0-20 Units Subcutaneous 3xd Meals    insulin  lispro  0-20 Units Subcutaneous TID AC    letermovir   480 mg Oral Nightly    levoFLOXacin   500 mg Oral Daily    [Provider Hold] losartan   50 mg Oral Daily    OLANZapine   7.5 mg Oral Nightly    tacrolimus   1.5 mg Oral Daily    tacrolimus   2 mg Oral Nightly    valACYclovir   500 mg Oral BID   [2]    IP okay to treat      sodium chloride  20 mL/hr (06/07/24 1326)   [3] albuterol , aluminum-magnesium  hydroxide-simethicone, CETAPHIL, dexAMETHasone , dextrose  in water, diphenhydrAMINE , emollient combination no.92, EPINEPHrine  IM, famotidine  (PEPCID ) IV, glucagon , glucose, IP okay to treat, magic mouthwash oral **AND** lidocaine , loperamide , loperamide , LORazepam , magnesium  sulfate, methylPREDNISolone  sodium succinate , ondansetron , phenol, potassium chloride  in water, prochlorperazine  **OR** prochlorperazine , sodium chloride  0.9%, traMADol 

## 2024-06-25 NOTE — Plan of Care (Signed)
 Shift Summary  Magnesium  sulfate was administered PRN in response to a low magnesium  level.   Pain scores decreased from 2 to 0 without intervention, and filgrastim -ayow was administered as scheduled.   Hourly visual checks confirmed the patient remained in bed and mostly asleep.   Sepsis risk scores and basic metabolic panel results were stable, with only mild abnormalities noted.   Overall, the patient maintained comfort and stability throughout the shift.     Absence of Hospital-Acquired Illness or Injury: Hourly visual checks documented that the patient remained in bed with eyes closed for most of the shift, and skin assessment noted bruising but no new abnormalities were reported. Sepsis risk scores remained low and stable throughout the shift, and basic metabolic panel results were within acceptable ranges except for a mildly elevated chloride and glucose.     Optimal Comfort and Wellbeing: Pain was intermittent and described as mild discomfort at the start of the shift, with a pain score of 2; no intervention was accepted, but pain scores decreased to 0 later in the shift. Magnesium  sulfate was administered PRN for a low magnesium  level, and filgrastim -ayow was given as scheduled.     Readiness for Transition of Care: Unplanned readmission score increased slightly over the shift, and neurological and psychosocial assessments remained within defined limits.

## 2024-06-25 NOTE — Plan of Care (Signed)
 Day +13 RIC Flu Mel Allo SCT.   Pancytopenic. Maintained blood counts above transfusion threshold. Reviewed Danger Zone protocol and falls precautions with patient.   Orthostatic,  asymptomatic.  500 ml LR infused per order.  Glucose monitored by dexcom and continues to be labile. Glucose correction with SSI and mealtime carb replacements.   Pt continues to defer CHG despite education.  Pt has discharge information and engages in frequent questions regarding content.  Problem: Stem Cell/Bone Marrow Transplant  Goal: Diarrhea Symptom Control  Outcome: Shift Focus  Goal: Nausea and Vomiting Symptom Relief  Outcome: Shift Focus     Problem: Fall Injury Risk  Goal: Absence of Fall and Fall-Related Injury  Outcome: Shift Focus  Intervention: Promote Injury-Free Environment  Recent Flowsheet Documentation  Taken 06/25/2024 0800 by Melanie Burnard HERO, RN  Safety Interventions:   bleeding precautions   chemotherapeutic agent precautions   fall reduction program maintained   infection management   isolation precautions   low bed   neutropenic precautions   nonskid shoes/slippers when out of bed     Problem: Diabetes  Goal: Blood Glucose Level Within Target Range  Outcome: Shift Focus

## 2024-06-26 DIAGNOSIS — C9100 Acute lymphoblastic leukemia not having achieved remission: Principal | ICD-10-CM

## 2024-06-26 LAB — CBC W/ AUTO DIFF
BASOPHILS ABSOLUTE COUNT: 0 10*9/L (ref 0.0–0.1)
BASOPHILS ABSOLUTE COUNT: 0 10*9/L (ref 0.0–0.1)
BASOPHILS RELATIVE PERCENT: 0.3 %
BASOPHILS RELATIVE PERCENT: 0.3 %
EOSINOPHILS ABSOLUTE COUNT: 0 10*9/L (ref 0.0–0.5)
EOSINOPHILS ABSOLUTE COUNT: 0 10*9/L (ref 0.0–0.5)
EOSINOPHILS RELATIVE PERCENT: 0 %
EOSINOPHILS RELATIVE PERCENT: 0 %
HEMATOCRIT: 22.8 % — ABNORMAL LOW (ref 34.0–44.0)
HEMATOCRIT: 25.4 % — ABNORMAL LOW (ref 34.0–44.0)
HEMOGLOBIN: 8 g/dL — ABNORMAL LOW (ref 11.3–14.9)
HEMOGLOBIN: 8.8 g/dL — ABNORMAL LOW (ref 11.3–14.9)
LYMPHOCYTES ABSOLUTE COUNT: 0.2 10*9/L — ABNORMAL LOW (ref 1.1–3.6)
LYMPHOCYTES ABSOLUTE COUNT: 0.6 10*9/L — ABNORMAL LOW (ref 1.1–3.6)
LYMPHOCYTES RELATIVE PERCENT: 5.8 %
LYMPHOCYTES RELATIVE PERCENT: 7 %
MEAN CORPUSCULAR HEMOGLOBIN CONC: 34.8 g/dL (ref 32.0–36.0)
MEAN CORPUSCULAR HEMOGLOBIN CONC: 35.2 g/dL (ref 32.0–36.0)
MEAN CORPUSCULAR HEMOGLOBIN: 32.5 pg — ABNORMAL HIGH (ref 25.9–32.4)
MEAN CORPUSCULAR HEMOGLOBIN: 32.6 pg — ABNORMAL HIGH (ref 25.9–32.4)
MEAN CORPUSCULAR VOLUME: 92.6 fL (ref 77.6–95.7)
MEAN CORPUSCULAR VOLUME: 93.3 fL (ref 77.6–95.7)
MEAN PLATELET VOLUME: 10.8 fL — ABNORMAL HIGH (ref 6.8–10.7)
MEAN PLATELET VOLUME: 9.9 fL (ref 6.8–10.7)
MONOCYTES ABSOLUTE COUNT: 0.3 10*9/L (ref 0.3–0.8)
MONOCYTES ABSOLUTE COUNT: 0.9 10*9/L — ABNORMAL HIGH (ref 0.3–0.8)
MONOCYTES RELATIVE PERCENT: 11.8 %
MONOCYTES RELATIVE PERCENT: 6.7 %
NEUTROPHILS ABSOLUTE COUNT: 3.4 10*9/L (ref 1.8–7.8)
NEUTROPHILS ABSOLUTE COUNT: 6.5 10*9/L (ref 1.8–7.8)
NEUTROPHILS RELATIVE PERCENT: 80.9 %
NEUTROPHILS RELATIVE PERCENT: 87.2 %
PLATELET COUNT: 42 10*9/L — ABNORMAL LOW (ref 150–450)
PLATELET COUNT: 75 10*9/L — ABNORMAL LOW (ref 150–450)
RED BLOOD CELL COUNT: 2.46 10*12/L — ABNORMAL LOW (ref 3.95–5.13)
RED BLOOD CELL COUNT: 2.72 10*12/L — ABNORMAL LOW (ref 3.95–5.13)
RED CELL DISTRIBUTION WIDTH: 13.8 % (ref 12.2–15.2)
RED CELL DISTRIBUTION WIDTH: 14.2 % (ref 12.2–15.2)
WBC ADJUSTED: 3.9 10*9/L (ref 3.6–11.2)
WBC ADJUSTED: 8 10*9/L (ref 3.6–11.2)

## 2024-06-26 LAB — BASIC METABOLIC PANEL
ANION GAP: 11 mmol/L (ref 5–14)
BLOOD UREA NITROGEN: 7 mg/dL — ABNORMAL LOW (ref 9–23)
BUN / CREAT RATIO: 9
CALCIUM: 9 mg/dL (ref 8.7–10.4)
CHLORIDE: 108 mmol/L — ABNORMAL HIGH (ref 98–107)
CO2: 23 mmol/L (ref 20.0–31.0)
CREATININE: 0.78 mg/dL (ref 0.55–1.02)
EGFR CKD-EPI (2021) FEMALE: >90 mL/min/1.73m2 (ref >=60)
GLUCOSE RANDOM: 150 mg/dL (ref 70–179)
POTASSIUM: 3.9 mmol/L (ref 3.4–4.8)
SODIUM: 142 mmol/L (ref 135–145)

## 2024-06-26 LAB — APTT
APTT: 29 s (ref 24.8–38.4)
HEPARIN CORRELATION: 0.2

## 2024-06-26 LAB — PROTIME-INR
INR: 1.15
PROTIME: 13.1 s — ABNORMAL HIGH (ref 9.9–12.6)

## 2024-06-26 LAB — TACROLIMUS LEVEL, TROUGH: TACROLIMUS, TROUGH: 7.7 ng/mL (ref 5.0–15.0)

## 2024-06-26 LAB — MAGNESIUM: MAGNESIUM: 1.5 mg/dL — ABNORMAL LOW (ref 1.6–2.6)

## 2024-06-26 MED ORDER — VALACYCLOVIR 500 MG TABLET
ORAL_TABLET | Freq: Two times a day (BID) | ORAL | 11 refills | 30.00000 days
Start: 2024-06-26 — End: ?

## 2024-06-26 MED ORDER — FLUCONAZOLE 200 MG TABLET
ORAL_TABLET | Freq: Every day | ORAL | 1 refills | 30.00000 days
Start: 2024-06-26 — End: ?

## 2024-06-26 MED ORDER — FAMOTIDINE 20 MG TABLET
ORAL_TABLET | Freq: Two times a day (BID) | ORAL | 0 refills | 14.00000 days
Start: 2024-06-26 — End: ?

## 2024-06-26 MED ORDER — TACROLIMUS 0.5 MG CAPSULE, IMMEDIATE-RELEASE
ORAL_CAPSULE | ORAL | 5 refills | 30.00000 days
Start: 2024-06-26 — End: 2025-06-26

## 2024-06-26 MED ORDER — HEPARIN, PORCINE (PF) 100 UNIT/ML INTRAVENOUS SYRINGE
5 refills | 0.00000 days
Start: 2024-06-26 — End: ?

## 2024-06-26 MED ADMIN — famotidine (PEPCID) tablet 20 mg: 20 mg | ORAL | @ 13:00:00

## 2024-06-26 MED ADMIN — famotidine (PEPCID) tablet 20 mg: 20 mg | ORAL | @ 02:00:00

## 2024-06-26 MED ADMIN — escitalopram oxalate (LEXAPRO) tablet 10 mg: 10 mg | ORAL | @ 13:00:00

## 2024-06-26 MED ADMIN — magnesium oxide-Mg AA chelate (Magnesium Plus Protein) 1 tablet: 1 | ORAL | @ 02:00:00

## 2024-06-26 MED ADMIN — fluconazole (DIFLUCAN) tablet 400 mg: 400 mg | ORAL | @ 13:00:00 | Stop: 2024-07-12

## 2024-06-26 MED ADMIN — letermovir (PREVYMIS) tablet 480 mg: 480 mg | ORAL | @ 02:00:00

## 2024-06-26 MED ADMIN — heparin, porcine (PF) 100 unit/mL injection 2 mL: 2 mL | INTRAVENOUS | @ 05:00:00

## 2024-06-26 MED ADMIN — magnesium sulfate 2gm/50mL IVPB: 2 g | INTRAVENOUS | @ 11:00:00

## 2024-06-26 MED ADMIN — magnesium sulfate 2gm/50mL IVPB: 2 g | INTRAVENOUS | @ 09:00:00

## 2024-06-26 MED ADMIN — cetirizine (ZYRTEC) tablet 10 mg: 10 mg | ORAL | @ 13:00:00

## 2024-06-26 MED ADMIN — valACYclovir (VALTREX) tablet 500 mg: 500 mg | ORAL | @ 02:00:00

## 2024-06-26 MED ADMIN — valACYclovir (VALTREX) tablet 500 mg: 500 mg | ORAL | @ 13:00:00

## 2024-06-26 MED ADMIN — insulin lispro (HumaLOG) injection CORRECTIONAL 0-20 Units: 0-20 [IU] | SUBCUTANEOUS | @ 17:00:00

## 2024-06-26 MED ADMIN — tacrolimus (PROGRAF) 1.5mg combo product: 1.5 mg | ORAL | @ 13:00:00

## 2024-06-26 MED ADMIN — levoFLOXacin (LEVAQUIN) tablet 500 mg: 500 mg | ORAL | @ 13:00:00 | Stop: 2024-06-26

## 2024-06-26 MED ADMIN — traMADol (ULTRAM) tablet 50 mg: 50 mg | ORAL | @ 13:00:00

## 2024-06-26 MED ADMIN — potassium chloride 20 mEq in 100 mL IVPB Premix: 20 meq | INTRAVENOUS | @ 09:00:00 | Stop: 2025-06-12

## 2024-06-26 MED ADMIN — diphenhydrAMINE (BENADRYL) capsule/tablet 25 mg: 25 mg | ORAL | @ 19:00:00 | Stop: 2024-06-26

## 2024-06-26 MED ADMIN — OLANZapine (ZYPREXA) tablet 5 mg: 5 mg | ORAL | @ 02:00:00

## 2024-06-26 MED ADMIN — tacrolimus (PROGRAF) capsule 2 mg: 2 mg | ORAL | @ 02:00:00

## 2024-06-26 MED ADMIN — lactated ringers bolus 1,000 mL: 1000 mL | INTRAVENOUS | @ 22:00:00 | Stop: 2024-06-26

## 2024-06-26 MED ADMIN — magnesium oxide-Mg AA chelate (Magnesium Plus Protein) 2 tablet: 2 | ORAL | @ 18:00:00 | Stop: 2024-06-26

## 2024-06-26 MED ADMIN — immun glob G(IgG)-pro-IgA 0-50 (PRIVIGEN) 10 % intravenous solution 25 g: .4 g/kg | INTRAVENOUS | @ 19:00:00 | Stop: 2024-06-26

## 2024-06-26 MED ADMIN — acetaminophen (TYLENOL) tablet 650 mg: 650 mg | ORAL | @ 19:00:00 | Stop: 2024-06-26

## 2024-06-26 MED ADMIN — insulin glargine (LANTUS) injection BASAL 12 Units: 12 [IU] | SUBCUTANEOUS | @ 13:00:00

## 2024-06-26 MED ADMIN — filgrastim-ayow (RELEUKO) injection syringe 480 mcg: 480 ug | SUBCUTANEOUS | @ 02:00:00

## 2024-06-26 NOTE — Progress Notes (Signed)
 BONE MARROW TRANSPLANT AND CELLULAR THERAPY PROGRESS NOTE    [name]  MRN: [id number]  Encounter Date: 06/26/24    Referring physician:  Dr. Toribio Bunker  BMT Attending MD: Dr. Jeaneen    Disease: Ph+B ALL  Current disease status: CR MRD Negative  Type of Transplant: RIC MUD Allo  Graft Source: Fresh PBSCs  Transplant Day: +14     Identifying Statement:  Patricia [name] is a 48 y.o. 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 Patricia [name] now presents for a matched unrelated donor stem cell transplant.      Interval History:  - [name]. Afebrile, VSS.   - Reports N/V symptoms have improved over the last day. Will discontinue Zyprexa  tonight and monitor nausea.  - Loose stools have decreased in frequency. Continue Imodium  PRN.  - Blood sugars 100-150s in the last 24h. Continue Lantus  12u.  - PO intake 3L in last 24h. Weight change: 0.499 kg (1 lb 1.6 oz)   - ANC 3.4 today. Discontinue G-CSF today.   - Most recent IgG 464. IVIG today.    Review of Systems:  A full system review was performed and was negative except as noted in the above interval history.    Temp:  [36.8 ??C (98.2 ??F)-37.2 ??C (99 ??F)] 36.9 ??C (98.4 ??F)  Pulse:  [90-125] 95  Resp:  [18-21] 20  BP: (93-116)/(63-81) 116/73  MAP (mmHg):  [75-90] 87  SpO2:  [97 %-99 %] 98 %    I/O last 3 completed shifts:  In: 4052 [P.O.:3552; IV Piggyback:500]  Out: 2750 [Urine:2750]  I/O this shift:  In: 108 [I.V.:108]  Out: 850 [Urine:850]    Last 5 Recorded Weights    06/21/24 1608 06/22/24 1633 06/23/24 1945 06/24/24 1625   Weight: 77.1 kg (169 lb 14.4 oz) 76.6 kg (168 lb 14.4 oz) 78.6 kg (173 lb 3.2 oz) 77.8 kg (171 lb 9.6 oz)    06/25/24 1927   Weight: 78.3 kg (172 lb 11.2 oz)     Weight change: 0.499 kg (1 lb 1.6 oz)    Test Results:   Reviewed in EPIC. Abnormal values discussed below.     Scheduled Meds:Scheduled Medications[1]  Continuous Infusions:Infusions Meds[2]  PRN Meds:.PRN Medications[3]    Physical Exam:   General : Sitting up in chair. Appears in no acute distress.  Central venous access: Line clean, dry, intact. No erythema or drainage noted.   ENT: Moist mucous membranes. Oropharhynx without erythema or exudate.   Cardiovascular: Pulse normal rate, regularity and rhythm. S1 and S2 normal, without any murmur, rub, or gallop.  Lungs: Clear to auscultation bilaterally, without wheezes/crackles/rhonchi. Good air movement.   Skin: Pale. Warm, dry, intact. No rash noted.   Psychiatry: Alert and oriented to person, place, and time.   Gastrointestinal: Normoactive bowel sounds, abdomen soft, non-tender   Musculoskeletal/Extremities: Full range of motion in shoulder, elbow, hip knee, ankle, wrists and feet. No edema.   Neurologic: CNII-XII intact. Normal strength and sensation throughout    Assessment/Plan:    BMT: ALL  CR MRD Negative  HCT-CI (age adjusted) 26 (age, anxiety, reduced DLCO, HF (reduced EF), T2DM, CMV viremia)  Conditioning:.  1. Fludarabine  40 mg/m2 days -5, -4, -3, -2  2. Melphalan  100  mg/m2 day -1      Donor: 10/10, Female, ABO O-, CMV negative  Engraftment: G-CSF starting Day + 5 through WBC recovery (as defined as ANC 1.0 x 2 days or 3.0 x 1 day)  -Date  of last G-CSF injection: TBD     GVHD prophylaxis:   1.Tacrolimus  starting on day -3 (goal 5-10 ng/mL)  2. Methotrexate  5 mg/m2 IVP on days +1, +3, +6 and +11  3.  ATG per [facility] standard dosing will not be included     Hem: Transfusion criteria: Transfuse 1 unit of PRBCs for hemoglobin <7 and 1 unit of platelets for Plt <10K or bleeding. Patricia [name]. Patricia [name] does not have a history of transfusion reactions. Consent was obtained and documented on 06/05/24.    ID:   Prophylaxis:  -Antiviral: Valtrex  500 mg po BID started on admission and continue for 2 year      Letermovir  indicated as R/CMV + D/CMV - and with prior hx of CMV viremia, plan to continue on admission through at least day +200  -Antifungal: Fluconazole  400 mg po daily to start day 0 thorugh day +75  -Antibacterial: -PJP: Sulfa allergic. Dapsone  daily upon platelet engraftment, has tolerated this well in the past. Toxo PCR negative.   -11/7 stopped Dapsone  upon admission, restart with platelet engraftment.      Prior infections:   Hx CMV viremia: Patricia [name], ICID is following.   Previously required Valcyte  treatment. 11/16 CMV viral load undetectable.  - Continue Letermovir  and Valtrex  BID through day +200 post SCT.      Recurrent Acute Sinusitis  Recurrent acute sinusitis with nasal congestion, frontal headache, and low-grade fever. Responded well to Augmentin .  -11/7 current sx resolving, started Augmentin  10 days (10/30-11/9)     Hypogammaglobulinemia   - Monthly IVIG - last on 05/16/2024   - Check weekly IgG levels   - IgG 464 11/24. Plan for IVIG today 11/26    GI:   GERD prophylaxis:  -Pepcid  BID     Nausea:  -Anti-emetics per BMT protocol.   - 11/14: Zyprexa  5 mg nightly  - 11/19: Zofran  sched TID, Increased Zyprexa  to 7.5mg  HS  - 11/25: Taper Zyprexa  5mg  HS - discontinued 11/26     Diarrhea:  -Antidiarrheals per BMT protocol    Renal: Creatinine normal. No issues.     Dysuria:   - 11/19: Noted suprapubic pain/tenderness. UA unrevealing. BK,Urine detected < 2.30 log.     FEN:    Electrolyte replacement per protocol    Hypomagnesemia   - Start Mag Chelate 1 tab BID    Hepatic: Normal bilirubin. SOS prophylaxis not indicated with RIC regimen.   - 06/17/24: Rise in AST/ALT, monitor LFTs daily     CV: Followed by [facility]iology: Heart failure with mildly reduced ejection fraction (HFmrEF)   Hx of EF 45-50%, most recent Echo 05/23/24 improved to 55-60%  Hx of prolonged QTc, last EKG 489 follow closely with addition of QTc prolonging medications  -11/7 QTc 457. Check weekly EKGs (next on 11/14), added cardiac lytes   -11/8: 40 meq of K  -11/9: 20 meq of K     Hypertension:    - Home med Losartan  50 mg daily (held 11/18)    Vasovagal Episode:  -11/14: Lightheadedness with a fall overnight 2am. (-) stat head CT w/o contrast. Neuro exam intact  -11/16: Feeling well, denies N/V, HA/lightheaded.    Pulm: No pulmonary symptoms currently. Reduced DLCO      Endocrine:   Menses suppression: Lupron  last 07/26/22. 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 At home uses Dexcom for monitoring.  - 11/7 ACHS and SSI while inpatient. Lantus  25  units in AM. Can consider using Dexcom next week if numbers similar to hospital glucometer results   - 11/8: holding Lantus  due to morning POC 89  - 11/9: Blood glucose range 131 -189, resumed Lantus  at lower dose 10 Units  - 11/11: BG 200-400s in setting of steroids. Increased Lantus  to 15u.  - 11/12: BG 300s. Increased Lantus  to 18u.   - 11/16: BG range 288-363 last 24 hours, adjust SSI insulin  sensitivity factor  - 11/18: Appreciate endocrinology recs. Increased Lantus  20u. Placed on nutritional and correctional SSI.   - 11/20: Poor PO intake, decrease Lantus  to 18u.  - 11/24: Decreased PO intake, decrease Lantus  to 12u.    Neuro/Pain:  - PRN tramadol  50 mg added. No oxy as this give patient migraines   - 11/8: Tramadol  was not helping, Morphine  with mild relief. Ordered dilaudid  0.5mg  q8 prn x 2 days for severe pain, removed tramadol  prn.  - 11/11: Tramadol  50mg  Q6H ordered PRN with goal to transition to oral medications for pain control.  - 11/12: Discontinued Dilaudid . Manage chronic hip/back pain with Tramadol  q6h PRN + heat therapy.     Psych: Followed by Palliative Care     Anxiety: Home med: Lexapro  10 mg daily   Reconnected with her local therapist   - If pt continues to endorse anxiety, may increase Lexapro  to 20 mg daily  - 11/7 added PRN ativan  0.5 mg Q8     Caregivers: Lives in [place], [place]  Primary caregiver: Jeanie Derise (mom)- first 30 days at [facility] then home if doing well will stay longer if medically needed  Secondary caregiver: Devaughn Ranger (husband)       Consults: While inpatient, consider consults to music therapy, integrative medicine and massage therapy. Keola Heninger Jo-an Elva Levy, ACNP  [facility]                     [1]    cetirizine   10 mg Oral Daily    escitalopram  oxalate  10 mg Oral Daily    famotidine   20 mg Oral BID    filgrastim   480 mcg Subcutaneous Q24H    fluconazole   400 mg Oral Daily    heparin , porcine (PF)  2 mL Intravenous Mon,Wed,Fri    insulin  glargine  12 Units Subcutaneous Daily    insulin  lispro  0-20 Units Subcutaneous 3xd Meals    insulin  lispro  0-20 Units Subcutaneous TID AC    letermovir   480 mg Oral Nightly    [Provider Hold] losartan   50 mg Oral Daily    magnesium  oxide-Mg AA chelate  1 tablet Oral BID    OLANZapine   5 mg Oral Nightly    tacrolimus   1.5 mg Oral Daily    tacrolimus   2 mg Oral Nightly    valACYclovir   500 mg Oral BID   [2]    IP okay to treat      sodium chloride  20 mL/hr (06/07/24 1326)   [3] albuterol , aluminum-magnesium  hydroxide-simethicone, CETAPHIL, dexAMETHasone , dextrose  in water, diphenhydrAMINE , emollient combination no.92, EPINEPHrine  IM, famotidine  (PEPCID ) IV, glucagon , glucose, IP okay to treat, magic mouthwash oral **AND** lidocaine , loperamide , loperamide , LORazepam , magnesium  sulfate, methylPREDNISolone  sodium succinate , ondansetron , phenol, potassium chloride  in water, prochlorperazine  **OR** prochlorperazine , sodium chloride  0.9%, traMADol 

## 2024-06-26 NOTE — Plan of Care (Signed)
 Patient is day +14 from her Allo SCT. Patient is A&O x 4. Vital signs stable and patient remained afebrile throughout the shift. PRNs given throughout the shift included tramadol  x 1 for pain. Patient refused some insulin  doses, provider aware. IVIG given per order. A 1L LR bolus is currently infusing per order. No acute events throughout the shift. Plan of care reviewed with patient. Safety measures present throughout shift.     Problem: Adult Inpatient Plan of Care  Goal: Absence of Hospital-Acquired Illness or Injury  Outcome: Shift Focus  Intervention: Identify and Manage Fall Risk  Recent Flowsheet Documentation  Taken 06/26/2024 0815 by Auston Vernell MATSU, RN  Safety Interventions:   aspiration precautions   bleeding precautions   environmental modification   fall reduction program maintained   infection management   isolation precautions   low bed   lighting adjusted for tasks/safety   neutropenic precautions   nonskid shoes/slippers when out of bed  Intervention: Prevent Skin Injury  Recent Flowsheet Documentation  Taken 06/26/2024 0815 by Auston Vernell MATSU, RN  Positioning for Skin: Supine/Back  Device Skin Pressure Protection: adhesive use limited  Skin Protection:   adhesive use limited   transparent dressing maintained  Intervention: Prevent Infection  Recent Flowsheet Documentation  Taken 06/26/2024 0815 by Auston Vernell MATSU, RN  Infection Prevention:   environmental surveillance performed   hand hygiene promoted   equipment surfaces disinfected   rest/sleep promoted   personal protective equipment utilized   single patient room provided   visitors restricted/screened  Goal: Optimal Comfort and Wellbeing  Outcome: Shift Focus     Problem: Infection  Goal: Absence of Infection Signs and Symptoms  Outcome: Shift Focus  Intervention: Prevent or Manage Infection  Recent Flowsheet Documentation  Taken 06/26/2024 0815 by Auston Vernell MATSU, RN  Infection Management: aseptic technique maintained  Isolation Precautions: protective precautions maintained     Problem: Stem Cell/Bone Marrow Transplant  Goal: Improved Activity Tolerance  Outcome: Shift Focus  Intervention: Promote Improved Energy  Recent Flowsheet Documentation  Taken 06/26/2024 0815 by Auston Vernell MATSU, RN  Activity Management: up ad lib  Goal: Blood Counts Within Acceptable Range  Outcome: Shift Focus  Intervention: Monitor and Manage Hematologic Symptoms  Recent Flowsheet Documentation  Taken 06/26/2024 0815 by Auston Vernell MATSU, RN  Bleeding Precautions:   blood pressure closely monitored   monitored for signs of bleeding   gentle oral care promoted     Shift Summary  Pain resolved after PRN traMADol  administration, with comfort maintained throughout the shift.   Spiritual care interventions were provided, and the patient expressed appreciation and hopefulness.   Infection prevention protocols were followed, and VRE screen was not detected.   No new hospital-acquired injuries or complications were documented, and safety interventions were consistently maintained.   Overall, comfort and safety were maintained, and infection prevention measures were upheld during the shift.    Absence of Hospital-Acquired Illness or Injury: No new hospital-acquired injuries or complications were documented during the shift; safety interventions and environmental precautions were consistently maintained, and skin protection measures were in place. Bruising was noted but no changes or new abnormalities were reported.    Optimal Comfort and Wellbeing: Lower aching pain was present at the start of the shift but resolved after PRN traMADol  administration, with pain scores remaining at 0 for the rest of the shift. Spiritual care was provided, and the patient expressed calm, hopefulness, and appreciation for the chaplain visit.    Absence of Infection  Signs and Symptoms: Temperature remained stable throughout the shift, and infection prevention and management protocols were followed. VRE screen was not detected, and neutropenic and isolation precautions were maintained.

## 2024-06-26 NOTE — Consults (Signed)
 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     Date of Transplant: 06/12/24      Prior Dosing Information: see table below     Source(s) of information used to determine prior to admission dosing: MAR    Goals:  Therapeutic Drug Levels  Tacrolimus  trough goal: 5-10 ng/mL    Additional Clinical Monitoring/Outcomes  Monitor renal function (SCr and urine output) and liver function (LFTs)  Monitor for signs/symptoms of adverse events (e.g., hyperglycemia, hyperkalemia, hypomagnesemia, hypertension, headache, tremor)    Previous Lab Results:  Tacrolimus , Trough   Date Value Ref Range Status   06/26/2024 7.7 5.0 - 15.0 ng/mL Final   06/24/2024 9.7 5.0 - 15.0 ng/mL Final   06/22/2024 6.2 5.0 - 15.0 ng/mL Final   06/20/2024 6.5 5.0 - 15.0 ng/mL Final   06/18/2024 6.8 5.0 - 15.0 ng/mL Final     Creatinine   Date Value Ref Range Status   06/25/2024 0.78 0.55 - 1.02 mg/dL Final   88/75/7974 9.41 0.55 - 1.02 mg/dL Final   88/76/7974 9.33 0.55 - 1.02 mg/dL Final      Result:  Tacrolimus  level from today was drawn appropriately     Pharmacokinetic Considerations and Significant Drug Interactions:  Concurrent CYP3A4 substrates/inhibitors: fluconazole  (moderate inhibitor, started 11/12), letermovir  (moderate inhibitor, initiated 05/2023)    Assessment/Plan:  Recommendation(s)  Trough continues to be therapeutic  Fluconazole  and Letermovir  have been at steady state and should not have a continued affect on tacrolimus  levels  SCr WNL but slightly increased from baseline. Will CTM  Bili/LFTs from 11/24 WNL  Given the above, plan to continue tacrolimus  1.5 mg qAM and 2 mg qPM and recheck in 2 days prior to adjusting the dose    Follow-up  Next level has been ordered on 11/28 at 0800.   A pharmacist will continue to monitor and recommend levels as appropriate    Longitudinal Dose Monitoring:  Date Dose (mg), Route AM Scr (mg/dL) Level  (ng/mL) Key Drug Interactions   11/9 3.5/3.5 0.51  Letermovir    11/10 3.5/3.5 0.44  Letermovir    11/11 2.5/--- 0.51 16.1 Letermovir    11/12 ---/--- 0.53 10.7 Letermovir , Fluconazole  started   11/13 2/2 0.52 -- Letermovir , Fluconazole    11/14 2/-- 0.55 9.4 Letermovir , Fluconazole    11/15 1.5/1.5 0.48  Letermovir , Fluconazole    11/16 1.5/2 0.55 3.1 Letermovir , Fluconazole     11/17 1.5/2 0.45  Letermovir , Fluconazole     11/18 1.5/2 0.48 6.8 Letermovir , Fluconazole     11/19 1.5/2 0.69  Letermovir , Fluconazole     11/20 1.5/2 0.55 6.5 Letermovir , Fluconazole     11/21 1.5/2 0.56  Letermovir , Fluconazole     11/22 1.5/2 0.57 6.2 Letermovir , Fluconazole     11/23 1.5/2 0.49  Letermovir , Fluconazole     11/24 1.5/2 0.66 9.7 Letermovir , Fluconazole     11/25 1.5/2 0.58  Letermovir , Fluconazole     11/26 1.5/2 0.78 7.7 Letermovir , Fluconazole       Please page service pharmacist with questions/clarifications.    Evander Piety, PharmD  PGY-2 Oncology Pharmacy Resident

## 2024-06-26 NOTE — Plan of Care (Addendum)
 Pt day +14 allo transplant. Patient A&O X4, VSS, afebrile throughout shift. PRNs for shift include magnesium  and potassium replacement. Lines and claves changed. No acute events throughout shift. Plan of care reviewed with patient and applicable visitors, safety measures in place throughout shift.         Problem: Adult Inpatient Plan of Care  Goal: Absence of Hospital-Acquired Illness or Injury  Intervention: Identify and Manage Fall Risk  Recent Flowsheet Documentation  Taken 06/25/2024 2130 by Ala Kratz E, RN  Safety Interventions:   bleeding precautions   commode/urinal/bedpan at bedside   fall reduction program maintained   isolation precautions   neutropenic precautions   nonskid shoes/slippers when out of bed   lighting adjusted for tasks/safety   low bed  Intervention: Prevent Skin Injury  Recent Flowsheet Documentation  Taken 06/25/2024 2130 by Laiah Pouncey E, RN  Positioning for Skin: Supine/Back  Device Skin Pressure Protection: adhesive use limited  Skin Protection: adhesive use limited  Intervention: Prevent Infection  Recent Flowsheet Documentation  Taken 06/25/2024 2130 by Marnie Fazzino, Lorrayne E, RN  Infection Prevention:   cohorting utilized   environmental surveillance performed   hand hygiene promoted   equipment surfaces disinfected   personal protective equipment utilized   rest/sleep promoted   visitors restricted/screened   single patient room provided     Problem: Infection  Goal: Absence of Infection Signs and Symptoms  Intervention: Prevent or Manage Infection  Recent Flowsheet Documentation  Taken 06/25/2024 2130 by Redford Behrle, Lorrayne E, RN  Infection Management: aseptic technique maintained  Isolation Precautions: protective precautions maintained     Problem: Stem Cell/Bone Marrow Transplant  Goal: Diarrhea Symptom Control  Intervention: Manage Diarrhea  Recent Flowsheet Documentation  Taken 06/25/2024 2130 by Ferne Ellingwood E, RN  Skin Protection: adhesive use limited  Goal: Improved Activity Tolerance  Intervention: Promote Improved Energy  Recent Flowsheet Documentation  Taken 06/25/2024 2130 by Anurag Scarfo, Lorrayne E, RN  Activity Management: up ad lib  Goal: Blood Counts Within Acceptable Range  Intervention: Monitor and Manage Hematologic Symptoms  Recent Flowsheet Documentation  Taken 06/25/2024 2130 by Chanita Boden E, RN  Bleeding Precautions:   coagulation study results reviewed   blood pressure closely monitored   foot protection facilitated   monitored for signs of bleeding  Goal: Absence of Infection  Intervention: Prevent and Manage Infection  Recent Flowsheet Documentation  Taken 06/25/2024 2130 by Daniyla Pfahler E, RN  Infection Management: aseptic technique maintained  Infection Prevention:   cohorting utilized   environmental surveillance performed   hand hygiene promoted   equipment surfaces disinfected   personal protective equipment utilized   rest/sleep promoted   visitors restricted/screened   single patient room provided  Isolation Precautions: protective precautions maintained  Goal: Improved Oral Mucous Membrane Health and Integrity  Intervention: Promote Oral Comfort and Health  Recent Flowsheet Documentation  Taken 06/25/2024 2130 by Blakelyn Dinges, Lorrayne E, RN  Oral Mucous Membrane Protection: nonirritating oral fluids promoted     Problem: Fall Injury Risk  Goal: Absence of Fall and Fall-Related Injury  Intervention: Promote Scientist, Clinical (histocompatibility And Immunogenetics) Documentation  Taken 06/25/2024 2130 by Xylia Scherger, Lorrayne E, RN  Safety Interventions:   bleeding precautions   commode/urinal/bedpan at bedside   fall reduction program maintained   isolation precautions   neutropenic precautions   nonskid shoes/slippers when out of bed   lighting adjusted for tasks/safety   low bed     Problem: Diabetes  Goal: Optimal Functional Ability  Intervention: Optimize Functional  Ability  Recent Flowsheet Documentation  Taken 06/25/2024 2130 by Buna Cuppett E, RN  Activity Management: up ad lib

## 2024-06-27 DIAGNOSIS — C9100 Acute lymphoblastic leukemia not having achieved remission: Principal | ICD-10-CM

## 2024-06-27 DIAGNOSIS — Z9481 Bone marrow transplant status: Principal | ICD-10-CM

## 2024-06-27 LAB — IONIZED CALCIUM VENOUS: CALCIUM IONIZED VENOUS (MG/DL): 4.95 mg/dL (ref 4.40–5.40)

## 2024-06-27 LAB — PHOSPHORUS: PHOSPHORUS: 4.5 mg/dL (ref 2.4–5.1)

## 2024-06-27 LAB — BASIC METABOLIC PANEL
ANION GAP: 12 mmol/L (ref 5–14)
BLOOD UREA NITROGEN: 8 mg/dL — ABNORMAL LOW (ref 9–23)
BUN / CREAT RATIO: 12
CALCIUM: 9.2 mg/dL (ref 8.7–10.4)
CHLORIDE: 110 mmol/L — ABNORMAL HIGH (ref 98–107)
CO2: 22 mmol/L (ref 20.0–31.0)
CREATININE: 0.67 mg/dL (ref 0.55–1.02)
EGFR CKD-EPI (2021) FEMALE: >90 mL/min/1.73m2 (ref >=60)
GLUCOSE RANDOM: 160 mg/dL (ref 70–179)
POTASSIUM: 4.6 mmol/L (ref 3.4–4.8)
SODIUM: 144 mmol/L (ref 135–145)

## 2024-06-27 LAB — HEPATIC FUNCTION PANEL
ALBUMIN: 3.3 g/dL — ABNORMAL LOW (ref 3.4–5.0)
ALKALINE PHOSPHATASE: 84 U/L (ref 46–116)
ALT (SGPT): 44 U/L (ref 10–49)
AST (SGOT): 25 U/L (ref <=34)
BILIRUBIN DIRECT: 0.1 mg/dL (ref 0.00–0.30)
BILIRUBIN TOTAL: 0.3 mg/dL (ref 0.3–1.2)
PROTEIN TOTAL: 6.4 g/dL (ref 5.7–8.2)

## 2024-06-27 LAB — TACROLIMUS LEVEL, TROUGH: TACROLIMUS, TROUGH: 10.1 ng/mL (ref 5.0–15.0)

## 2024-06-27 LAB — LACTATE DEHYDROGENASE: LACTATE DEHYDROGENASE: 432 U/L — ABNORMAL HIGH (ref 120–246)

## 2024-06-27 LAB — MAGNESIUM: MAGNESIUM: 1.6 mg/dL (ref 1.6–2.6)

## 2024-06-27 MED ORDER — MAGNESIUM OXIDE-MAGNESIUM AMINO ACID CHELATE 133 MG TABLET
ORAL_TABLET | Freq: Two times a day (BID) | ORAL | 2 refills | 25.00000 days | Status: CP
Start: 2024-06-27 — End: ?
  Filled 2024-06-27: qty 100, 25d supply, fill #0

## 2024-06-27 MED ORDER — VALACYCLOVIR 500 MG TABLET
ORAL_TABLET | Freq: Two times a day (BID) | ORAL | 11 refills | 30.00000 days | Status: CP
Start: 2024-06-27 — End: ?
  Filled 2024-06-27: qty 60, 30d supply, fill #0

## 2024-06-27 MED ORDER — FAMOTIDINE 20 MG TABLET
ORAL_TABLET | Freq: Two times a day (BID) | ORAL | 0 refills | 14.00000 days | Status: CP
Start: 2024-06-27 — End: ?
  Filled 2024-06-27: qty 28, 14d supply, fill #0

## 2024-06-27 MED ORDER — TRAMADOL 50 MG TABLET
ORAL_TABLET | Freq: Four times a day (QID) | ORAL | 0 refills | 4.00000 days | Status: CP | PRN
Start: 2024-06-27 — End: 2024-07-04
  Filled 2024-06-27: qty 14, 4d supply, fill #0

## 2024-06-27 MED ORDER — TACROLIMUS 0.5 MG CAPSULE, IMMEDIATE-RELEASE
ORAL_CAPSULE | ORAL | 5 refills | 30.00000 days | Status: CP
Start: 2024-06-27 — End: 2025-06-27
  Filled 2024-06-27: qty 210, 30d supply, fill #0

## 2024-06-27 MED ORDER — HEPARIN, PORCINE (PF) 100 UNIT/ML INTRAVENOUS SYRINGE
5 refills | 0.00000 days | Status: CP
Start: 2024-06-27 — End: ?
  Filled 2024-06-27: qty 180, 28d supply, fill #0

## 2024-06-27 MED ORDER — FLUCONAZOLE 200 MG TABLET
ORAL_TABLET | Freq: Every day | ORAL | 1 refills | 30.00000 days | Status: CP
Start: 2024-06-27 — End: ?
  Filled 2024-06-27: qty 60, 30d supply, fill #0

## 2024-06-27 MED ORDER — ONDANSETRON HCL 8 MG TABLET
ORAL_TABLET | Freq: Three times a day (TID) | ORAL | 2 refills | 10.00000 days | Status: CP | PRN
Start: 2024-06-27 — End: ?
  Filled 2024-06-27: qty 18, 21d supply, fill #0

## 2024-06-27 MED ADMIN — famotidine (PEPCID) tablet 20 mg: 20 mg | ORAL | @ 02:00:00

## 2024-06-27 MED ADMIN — famotidine (PEPCID) tablet 20 mg: 20 mg | ORAL | @ 15:00:00 | Stop: 2024-06-27

## 2024-06-27 MED ADMIN — escitalopram oxalate (LEXAPRO) tablet 10 mg: 10 mg | ORAL | @ 15:00:00 | Stop: 2024-06-27

## 2024-06-27 MED ADMIN — fluconazole (DIFLUCAN) tablet 400 mg: 400 mg | ORAL | @ 15:00:00 | Stop: 2024-06-27

## 2024-06-27 MED ADMIN — letermovir (PREVYMIS) tablet 480 mg: 480 mg | ORAL | @ 02:00:00

## 2024-06-27 MED ADMIN — magnesium sulfate 2gm/50mL IVPB: 2 g | INTRAVENOUS | @ 11:00:00 | Stop: 2024-06-27

## 2024-06-27 MED ADMIN — magnesium sulfate 2gm/50mL IVPB: 2 g | INTRAVENOUS | @ 09:00:00 | Stop: 2024-06-27

## 2024-06-27 MED ADMIN — cetirizine (ZYRTEC) tablet 10 mg: 10 mg | ORAL | @ 15:00:00 | Stop: 2024-06-27

## 2024-06-27 MED ADMIN — valACYclovir (VALTREX) tablet 500 mg: 500 mg | ORAL | @ 02:00:00

## 2024-06-27 MED ADMIN — valACYclovir (VALTREX) tablet 500 mg: 500 mg | ORAL | @ 15:00:00 | Stop: 2024-06-27

## 2024-06-27 MED ADMIN — insulin lispro (HumaLOG) injection CORRECTIONAL 0-20 Units: 0-20 [IU] | SUBCUTANEOUS | @ 15:00:00 | Stop: 2024-06-27

## 2024-06-27 MED ADMIN — tacrolimus (PROGRAF) 1.5mg combo product: 1.5 mg | ORAL | @ 15:00:00 | Stop: 2024-06-27

## 2024-06-27 MED ADMIN — traMADol (ULTRAM) tablet 50 mg: 50 mg | ORAL | @ 12:00:00 | Stop: 2024-06-27

## 2024-06-27 MED ADMIN — magnesium oxide-Mg AA chelate (Magnesium Plus Protein) 2 tablet: 2 | ORAL | @ 02:00:00

## 2024-06-27 MED ADMIN — magnesium oxide-Mg AA chelate (Magnesium Plus Protein) 2 tablet: 2 | ORAL | @ 15:00:00 | Stop: 2024-06-27

## 2024-06-27 MED ADMIN — tacrolimus (PROGRAF) capsule 2 mg: 2 mg | ORAL | @ 02:00:00

## 2024-06-27 MED ADMIN — insulin glargine (LANTUS) injection BASAL 12 Units: 12 [IU] | SUBCUTANEOUS | @ 15:00:00 | Stop: 2024-06-27

## 2024-06-27 NOTE — Plan of Care (Signed)
 BMT Allo Day +15: Pt is alert and oriented x4 and VSS. No complaints of n/v/d. Some mild back pain noted, tramadol  given at start of shift with good effect. Line and clave teaching completed with pt and caregiver. Caregiver successfully changed claves and dressing. Education provided and discharge test reviewed. Medications delivered to bedside. All questions/concerns answered and addressed, respectively. Left with caregiver to go to [facility] house.   Problem: Adult Inpatient Plan of Care  Goal: Absence of Hospital-Acquired Illness or Injury  Outcome: Discharged to Home  Intervention: Identify and Manage Fall Risk  Recent Flowsheet Documentation  Taken 06/27/2024 0800 by Karolynn Candida BROCKS, RN  Safety Interventions:   bleeding precautions   chemotherapeutic agent precautions   environmental modification   fall reduction program maintained   isolation precautions   infection management   lighting adjusted for tasks/safety   low bed   neutropenic precautions   nonskid shoes/slippers when out of bed  Intervention: Prevent Skin Injury  Recent Flowsheet Documentation  Taken 06/27/2024 0930 by Karolynn Candida BROCKS, RN  Positioning for Skin: Supine/Back  Device Skin Pressure Protection: adhesive use limited  Skin Protection: adhesive use limited  Taken 06/27/2024 0800 by Karolynn Candida BROCKS, RN  Positioning for Skin: Supine/Back  Device Skin Pressure Protection: adhesive use limited  Skin Protection: adhesive use limited  Intervention: Prevent Infection  Recent Flowsheet Documentation  Taken 06/27/2024 0800 by Karolynn Candida BROCKS, RN  Infection Prevention:   cohorting utilized   environmental surveillance performed   equipment surfaces disinfected   hand hygiene promoted   personal protective equipment utilized   single patient room provided   rest/sleep promoted   visitors restricted/screened  Goal: Optimal Comfort and Wellbeing  Outcome: Discharged to Home  Goal: Readiness for Transition of Care  Outcome: Discharged to Home  Goal: Rounds/Family Conference  Outcome: Discharged to Home     Problem: Infection  Goal: Absence of Infection Signs and Symptoms  Outcome: Discharged to Home  Intervention: Prevent or Manage Infection  Recent Flowsheet Documentation  Taken 06/27/2024 0800 by Karolynn Candida BROCKS, RN  Infection Management: aseptic technique maintained  Isolation Precautions: protective precautions maintained     Problem: Stem Cell/Bone Marrow Transplant  Goal: Optimal Coping with Transplant  Outcome: Discharged to Home  Goal: Symptom-Free Urinary Elimination  Outcome: Discharged to Home  Goal: Diarrhea Symptom Control  Outcome: Discharged to Home  Intervention: Manage Diarrhea  Recent Flowsheet Documentation  Taken 06/27/2024 0930 by Karolynn Candida BROCKS, RN  Skin Protection: adhesive use limited  Taken 06/27/2024 0800 by Karolynn Candida BROCKS, RN  Skin Protection: adhesive use limited  Goal: Improved Activity Tolerance  Outcome: Discharged to Home  Intervention: Promote Improved Energy  Recent Flowsheet Documentation  Taken 06/27/2024 0800 by Karolynn Candida BROCKS, RN  Activity Management: up ad lib  Goal: Blood Counts Within Acceptable Range  Outcome: Discharged to Home  Intervention: Monitor and Manage Hematologic Symptoms  Recent Flowsheet Documentation  Taken 06/27/2024 0800 by Karolynn Candida BROCKS, RN  Bleeding Precautions:   blood pressure closely monitored   coagulation study results reviewed   foot protection facilitated   gentle oral care promoted   monitored for signs of bleeding  Goal: Absence of Hypersensitivity Reaction  Outcome: Discharged to Home  Goal: Absence of Infection  Outcome: Discharged to Home  Intervention: Prevent and Manage Infection  Recent Flowsheet Documentation  Taken 06/27/2024 0800 by Karolynn Candida BROCKS, RN  Infection Management: aseptic technique maintained  Infection Prevention:   cohorting utilized  environmental surveillance performed   equipment surfaces disinfected   hand hygiene promoted   personal protective equipment utilized   single patient room provided   rest/sleep promoted   visitors restricted/screened  Isolation Precautions: protective precautions maintained  Goal: Improved Oral Mucous Membrane Health and Integrity  Outcome: Discharged to Home  Intervention: Promote Oral Comfort and Health  Recent Flowsheet Documentation  Taken 06/27/2024 0930 by Karolynn Candida BROCKS, RN  Oral Mucous Membrane Protection:   nonirritating oral fluids promoted   nonirritating oral foods promoted  Taken 06/27/2024 0800 by Elisabetta Mishra C, RN  Oral Mucous Membrane Protection:   nonirritating oral fluids promoted   nonirritating oral foods promoted  Goal: Nausea and Vomiting Symptom Relief  Outcome: Discharged to Home  Goal: Optimal Nutrition Intake  Outcome: Discharged to Home     Problem: Fall Injury Risk  Goal: Absence of Fall and Fall-Related Injury  Outcome: Discharged to Home  Intervention: Promote Injury-Free Environment  Recent Flowsheet Documentation  Taken 06/27/2024 0800 by Tajai Suder C, RN  Safety Interventions:   bleeding precautions   chemotherapeutic agent precautions   environmental modification   fall reduction program maintained   isolation precautions   infection management   lighting adjusted for tasks/safety   low bed   neutropenic precautions   nonskid shoes/slippers when out of bed     Problem: Diabetes  Goal: Optimal Coping  Outcome: Discharged to Home  Goal: Optimal Functional Ability  Outcome: Discharged to Home  Intervention: Optimize Functional Ability  Recent Flowsheet Documentation  Taken 06/27/2024 0800 by Karolynn Candida BROCKS, RN  Activity Management: up ad lib  Goal: Blood Glucose Level Within Target Range  Outcome: Discharged to Home  Goal: Minimize Risk of Hypoglycemia  Outcome: Discharged to Home     Problem: Self-Care Deficit  Goal: Improved Ability to Complete Activities of Daily Living  Outcome: Discharged to Home

## 2024-06-27 NOTE — Discharge Summary (Signed)
 BMTCTP Discharge Summary    Admit date: 06/07/2024  Discharge date: 06/27/24  Discharge to: [facility]  Discharge Service: MDT    Referring Oncologist: Dr. Toribio Bunker   BMT Attending Physician: Dr. 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 Patient reports feeling well. Denies any fever, chills, chest pain, palpitation, SOB, cough, N/V/D, dysuria.     ROS:  Comprehensive ROS negative except pertinent positives listed in interval history.     Physical exam:  Vitals:    06/27/24 0752   BP: 116/67   Pulse: 97   Resp: 18   Temp: 36.6 ??C (97.9 ??F)   SpO2: 98%     Vitals:    06/25/24 1927 06/26/24 1949   Weight: 78.3 kg (172 lb 11.2 oz) 79.3 kg (174 lb 13.2 oz)       KPS at discharge: 90,  Able to carry on normal activity; minor signs or symptoms of disease (ECOG equivalent 0)    General : No acute distress noted.   Central venous access: Line clean, dry, intact. No erythema or drainage noted.   ENT: Moist mucous membranes. Oropharhynx without lesions, erythema or exudate.   Cardiovascular: Pulse normal rate, regularity and rhythm. S1 and S2 normal, without any murmur, rub, or gallop.  Lungs: Clear to auscultation bilateraly, without wheezes/crackles/rhonchi. Good air movement.   Skin: Warm, dry, intact. No rash noted.   Psychiatry: Alert and oriented to person, place, and time.   GI: Normoactive bowel sounds, abdomen soft, non-tender   Extremeties: No edema.   Musculoskeletal/Extremities: Full range of motion in shoulder, elbow, hip knee, ankle, left hand and feet.  Neurologic: CNII-XII intact. Normal strength and sensation throughout    Lab Results   Component Value Date    WBC 8.0 06/26/2024    HGB 8.8 (L) 06/26/2024    HCT 25.4 (L) 06/26/2024    PLT 75 (L) 06/26/2024     Lab Results   Component Value Date    NA 144 06/26/2024    K 4.6 06/26/2024    CL 110 (H) 06/26/2024    CO2 22.0 06/26/2024    BUN 8 (L) 06/26/2024    CREATININE 0.67 06/26/2024    GLU 160 06/26/2024    CALCIUM  9.2 06/26/2024    MG 1.6 06/26/2024    PHOS 4.5 06/26/2024     Lab Results   Component Value Date    BILITOT 0.3 06/26/2024    BILIDIR 0.10 06/26/2024    PROT 6.4 06/26/2024    ALBUMIN 3.3 (L) 06/26/2024    ALT 44 06/26/2024    AST 25 06/26/2024    ALKPHOS 84 06/26/2024    GGT 836 (H) 11/25/2023     Lab Results   Component Value Date    PT 13.1 (H) 06/26/2024    INR 1.15 06/26/2024    APTT 29.0 06/26/2024         Hospital Course/Assessment  and Plan  Ph+ ALL: In a complete molecular remission ahead of transplant following Blina/TKI salvage. Plan for BCR:ABL testing at D+30 post-transplant and TKI maintenance once adequately recovered.   BMT: Currently D+14 of her MUD alloSCT with Qol/Fzo899 RIC. Discontinue G-CSF prophylaxis on 11/26 (only had 1 dose on D+12) as patient with neutrophil engraftment (ANC 3.4). 11/27 ANC 6.5.  GVHD: Tac/MTX prophylaxis given her prior cardiomyopathy during induction chemotherapy with goal of avoiding PTCy.  Tacrolimus  started on day -3 and she received low-dose methotrexate  on days 1, 3, 6, and 11. Tacrolimus  trough goal is 5-10 with level remaining therapeutic. Will repeat Tac level on 11/29.  Immunosuppressed/ID: Continue fluconazole  and Valtrex  prophylaxis. Levofloxacin  dc'ed with engraftment. We will start dapsone  for PJP prophylaxis upon platelet engraftment in clinic. 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    Hematology: 11/27 ANC 6.5, PLT 75  Date of neutrophil engraftment (first day of ANC >= 500/mm3 for 3 consecutive days): 06/25/24  Date of platelet engraftment (>20K x3 days without transfusion in 7 days): 06/26/24  Number of RBC infusions during transplant hospitalization: 0 units  Number of platelet infusions during transplant hospitalization: 1 units    CV: Developed cardiomyopathy 02/16/2024 with an EF 45-50%.  Most recent TTE 05/23/24 show EF recovered to 55-60%. Continuing to hold home Losartan  with stable pressures.   Endocrinology: T2DM with steroid induced hyperglycemia. Will discharge with Lantus  12U QAM and  mealtime SSI based on carb counting. She is wearing a continuous glucometer.   GI: Non-infectious diarrhea with negative C. Difficile testing. Will continue prn Imodium  (patient preference). Will discontinue Zyprexa  as patient remains with minimal nausea and has engrafted. Continue prn Zofran  (frontline) for nausea with Compazine  for persistent symptoms.   FEN: Improved intake and not needing fluids. Will discharged with scheduled Mg-chelate 2# BID.  GU: Suspect dysuria and suprapubic discomfort secondary to low level BK viuria. UA not concerning for UTI. Will continue to monitor. No indication to treat BK at present.     Labs for first clinic visit entered: Yes.  For allos, please order CBC w/diff, CMP, magnesium , tacrolimus /sirolimus, CMV    Condition at Discharge: good    I spent greater than 30 minutes in the discharge of this patient.    Babara Battles, MD   [facility] Bone Marrow Transplant and Cellular Therapy Progam  Discharge Medications:      Your Medication List        PAUSE taking these medications      dapsone  100 MG tablet  Wait to take this until your doctor or other care provider tells you to start again.  Take 1 tablet (100 mg total) by mouth daily. TAKE 1 TABLET(100 MG) BY MOUTH DAILY     losartan  50 MG tablet  Wait to take this until your doctor or other care provider tells you to start again.  Commonly known as: COZAAR   Take 1 tablet (50 mg total) by mouth in the morning.            STOP taking these medications      amoxicillin -clavulanate 875-125 mg per tablet  Commonly known as: AUGMENTIN      aspirin  81 MG tablet  Commonly known as: ECOTRIN  START taking these medications      famotidine  20 MG tablet  Commonly known as: PEPCID   Take 1 tablet (20 mg total) by mouth two (2) times a day.     fluconazole  200 MG tablet  Commonly known as: DIFLUCAN   Take 2 tablets (400 mg total) by mouth daily.     heparin , porcine (PF) 100 unit/mL Syrg  Instill 5 mL in each of the 3 central line lumens three times per week.     magnesium  oxide-Mg AA chelate 133 mg  Commonly known as: Magnesium  Plus Protein  Take 2 tablets by mouth two (2) times a day.     ondansetron  8 MG tablet  Commonly known as: ZOFRAN   Take 1 tablet (8 mg total) by mouth Three (3) times a day as needed.     tacrolimus  0.5 MG capsule  Commonly known as: PROGRAF   Take 3 capsules (1.5 mg total) by mouth daily AND 4 capsules (2 mg total) nightly.     traMADol  50 mg tablet  Commonly known as: ULTRAM   Take 1 tablet (50 mg total) by mouth every six (6) hours as needed for up to 7 days.            CHANGE how you take these medications      insulin  aspart 100 unit/mL (3 mL) injection pen  Commonly known as: NovoLOG  FLEXPEN  Give 1 unit of insulin  for every 8 grams of carbs.   Use up to 30 units/day, divided TID AC meals per carb counting.  What changed: additional instructions     LANTUS  SOLOSTAR U-100 INSULIN  100 unit/mL (3 mL) injection pen  Generic drug: insulin  glargine  Inject 0.12 mL (12 Units total) under the skin daily.  What changed: how much to take     valACYclovir  500 MG tablet  Commonly known as: VALTREX   Take 1 tablet (500 mg total) by mouth two (2) times a day.  What changed: when to take this            CONTINUE taking these medications      ACCU-CHEK GUIDE GLUCOSE METER Misc  Generic drug: blood-glucose meter  Use to check blood sugar two times a day. Use as instructed.     albuterol  90 mcg/actuation inhaler  Commonly known as: PROVENTIL  HFA;VENTOLIN  HFA  Inhale 2 puffs every six (6) hours as needed for wheezing.     cetirizine  10 MG tablet  Commonly known as: ZYRTEC   Take 1 tablet (10 mg total) by mouth daily.     DEXCOM G7 SENSOR Devi  Generic drug: blood-glucose sensor  1 each by Miscellaneous route every ten (10) days.     escitalopram  oxalate 10 MG tablet  Commonly known as: LEXAPRO   Take 1 tablet (10 mg total) by mouth daily.     glucose blood test strip  Generic drug: blood sugar diagnostic  Use to check blood sugar two times a day.     lancets Misc  Use to check blood sugar as directed 2 times a day & for symptoms of high or low blood sugar.     letermovir  480 mg tablet  Commonly known as: PREVYMIS   Take 1 tablet (480 mg total) by mouth at bedtime.     LORazepam  0.5 MG tablet  Commonly known as: ATIVAN   Take 1 tablet (0.5 mg total) by mouth every eight (8) hours as needed for anxiety.              Pending Test Results:  Pending Labs       Order Current Status    Tacrolimus  Level, Trough Collected (06/27/24 0937)            Discharge Instructions:     Appointments which have been scheduled for you      Jun 29, 2024 11:00 AM  (Arrive by 10:30 AM)  BMT INFUSION ONLY with ONCINF CHAIR 14  [facility] ONCOLOGY INFUSION [facility] REGION) [address]  [place] [address]  [phone]        Jul 01, 2024 1:15 PM  (Arrive by 12:45 PM)  BMT LAB with MARLYCE LEOS  [facility] BMT [place] [facility] REGION) [address]  [place] [address]  [phone]        Jul 01, 2024 1:30 PM  (Arrive by 1:00 PM)  RETURN COMPLEX with [facility]  [facility] BMT [place] [facility] REGION) [address]  [place] [address]  [phone] Jul 01, 2024 2:00 PM  (Arrive by 1:30 PM)  OFFICE VISIT with ONCBMT APP B  [facility] BMT [place] [facility] REGION) [address]  [place] [address]  [phone]        Jul 03, 2024 10:00 AM  (Arrive by 9:30 AM)  RETURN VIDEO VISIT MYCHART with Orren Freddie Darner, MD  [place] [facility] ONCOLOGY 2ND FLR CAN[facility]ER [facility]OSP [facility] REGION) [address]  [place] [address]  [phone]   Please sign into My [place] Chart at least 15 minutes before your appointment to complete the eCheck-In process. You must complete eCheck-In before you can start your video visit. We also recommend testing your audio and video connection to troubleshoot any issues before your visit begins. Click ???Join Video Visit??? to complete these checks. Once you have completed eCheck-In and tested your audio and video, click ???Join Call??? to connect to your visit.     For your video visit, you will need a computer with a working camera, speaker and microphone, a smartphone, or a tablet with internet access.    My [place] Chart enables you to manage your health, send non-urgent messages to your provider, view your test results, schedule and manage appointments, and request prescription refills securely and conveniently from your computer or mobile device.    You can go to [email or web address] to sign in to your My [place] Chart account with your username and password. If you have forgotten your username or password, please choose the ???Forgot Username???? and/or ???Forgot Password???? links to gain access. You also can access your My [place] Chart account with the free MyChart mobile app for Android or iPhone.    If you need assistance accessing your My [place] Chart account or for assistance in reaching your provider's office to reschedule or cancel your appointment, please call [facility] [phone].         Jul 03, 2024 12:00 PM  (Arrive by 11:30 AM)  BMT LAB with MARLYCE LEOS  [facility] BMT [place] [facility] REGION) [address]  [place] [address]  [phone]        Jul 03, 2024 1:00 PM  (Arrive by 12:30 PM)  OFFICE VISIT with ONCBMT APP A  [facility] BMT [place] [facility] REGION) [address]  [place] [address]  [phone]        Jul 05, 2024 3:20 PM  (Arrive by 3:05 PM)  RETURN DIABETES  with [name], MD  [facility] DIABETES AND ENDOCRINOLOGY [place] [facility] REGION) [address]  [facility] 1 through 4  [place] [address]  [phone]        Jul 10, 2024 12:00 PM  (Arrive by 11:30 AM)  BMT LAB with MARLYCE LEOS  [facility] BMT [facility] REGION) [address]  [place] [address]  [phone]        Jul 10, 2024 1:00 PM  (Arrive by 12:30 PM)  OFFICE VISIT with Dossie Philippe Harvard, MD  [facility] BMT [facility] REGION) [address]  [place] [address]  [phone]        Jul 11, 2024 7:30 AM  (Arrive by 7:00 AM)  HEM INFUSION ONLY with ONCINF CHAIR 14  [facility] ONCOLOGY INFUSION [place] [facility] REGION) [address]  [place] [address]  [phone]        Aug 08, 2024 7:30 AM  (Arrive by 7:00 AM)  HEM INFUSION ONLY with ALBERTSON'S CHAIR 18  [facility] ONCOLOGY INFUSION [place] [facility] REGION) [address]  [place] [address]  [phone]        Aug 13, 2024 9:30 AM  (Arrive by 9:00 AM)  BMT LAB with MARLYCE LEOS  [facility] BMT [place] [facility] REGION) [address]  [place] [address]  [phone]        Aug 13, 2024 10:30 AM  (Arrive by 10:00 AM)  OFFICE VISIT with Dossie Philippe Harvard, MD  [facility] BMT [place] [facility] REGION) [address]  [place] [address]  [phone]        Aug 13, 2024 1:25 PM  (Arrive by 1:10 PM)  RETURN CARDIOLOGY with Ileana Daring, MD  [facility] CARDIOLOGY [place] [facility] REGION) [address]  [facility] 1 through 4  [place] [address]  [phone]        Aug 27, 2024 9:50 AM  (Arrive by 9:35 AM)  PHARMACY ENDOCRINE with Arlean CHRISTELLA Andrews, CPP  [facility] DIABETES AND ENDOCRINOLOGY [place] [facility] REGION) [address]  [facility] 1 through 4  [place] [address]  [phone]        Aug 28, 2024 9:40 AM  (Arrive by 9:25 AM)  RETURN ENDOCRINE with [name], MD  [facility] DIABETES AND ENDOCRINOLOGY [place] [facility] REGION) [address]  [facility] 1 through 4  [place] [address]  [phone]        Sep 10, 2024 9:00 AM  (Arrive by 8:30 AM)  BMT LAB with MARLYCE LEOS  [facility] BMT [place] [facility] REGION) [address]  [place] [address]  [phone]        Sep 10, 2024 10:00 AM  (Arrive by 9:30 AM)  OFFICE VISIT with ONCBMT APP A  [facility] BMT [place] [facility] REGION) [address]  [place] [address]  [phone]        Sep 10, 2024 11:00 AM  (Arrive by 10:30 AM)  RETURN PALLIATIVE CARE with Alyce Belvie Gola, MD  [facility] ONCOLOGY MULTIDISCIPLINARY 2ND FLR CANCER HOSP [facility] REGION) [address]  [place] [address]  [phone]        Sep 10, 2024 12:00 PM  (Arrive by 11:30 AM)  BONE MARROW BIOPSY with  ONCBMT PROCEDURES  [facility] ONCOLOGY INFUSION [place] [facility] REGION) [address]  [place] [address]  [phone]        Sep 17, 2024 9:15 AM  (Arrive by 8:45 AM)  BMT LAB with MARLYCE LEOS  [facility] BMT [place] [facility] REGION) [address]  [place] [address]  [phone]        Sep 17, 2024 10:00 AM  (Arrive by 9:30 AM)  OFFICE VISIT with Dossie Philippe Harvard, MD  [facility] BMT [place] [facility] REGION) [address]  [place] [address]  [phone]        Sep 24, 2024 8:15 AM  (Arrive by 7:45 AM)  BMT LAB with MARLYCE LEOS  [facility] BMT [place] [facility] REGION) [address]  [place] [address]  [phone]        Sep 24, 2024 9:00 AM  (Arrive by 8:30 AM)  OFFICE VISIT with ONCBMT APP A  [facility] BMT [place] [facility] REGION) [address]  [place] [address]  [phone]        Sep 24, 2024 10:00 AM  (Arrive by 9:30 AM)  OFFICE VISIT with [facility]ARMACY  [facility] BMT [place] [facility] REGION) [address]  [place] [address]  [phone]        Sep 24, 2024 10:30 AM  (Arrive by 10:00 AM)  NURSE COORDINATOR with [facility] COORDINATOR  [facility] BMT [place] [facility] REGION) [address]  [place] [address]  [phone]        Dec 10, 2024 9:30 AM  (Arrive by 9:00 AM)  BMT LAB with MARLYCE LEOS  [facility] BMT [place] [facility] REGION) [address]  [place] [address]  [phone]        Dec 10, 2024 10:30 AM  (Arrive by 10:00 AM)  OFFICE VISIT with [facility] PHARMACY  [facility] BMT  [facility] REGION) [address]  [place] [address]  [phone]

## 2024-06-27 NOTE — Plan of Care (Signed)
 Pt day +15 allo transplant. Patient A&O X4, VSS, afebrile throughout shift. PRNs for shift include magnesium  replacement. No acute events throughout shift. Plan of care reviewed with patient and applicable visitors, safety measures in place throughout shift.         Problem: Adult Inpatient Plan of Care  Goal: Absence of Hospital-Acquired Illness or Injury  Intervention: Identify and Manage Fall Risk  Recent Flowsheet Documentation  Taken 06/26/2024 2045 by Faelynn Wynder E, RN  Safety Interventions:   aspiration precautions   bleeding precautions   fall reduction program maintained   isolation precautions   lighting adjusted for tasks/safety   low bed   neutropenic precautions   nonskid shoes/slippers when out of bed  Intervention: Prevent Skin Injury  Recent Flowsheet Documentation  Taken 06/26/2024 2045 by Ward Boissonneault E, RN  Positioning for Skin: Supine/Back  Device Skin Pressure Protection: adhesive use limited  Skin Protection: adhesive use limited  Intervention: Prevent Infection  Recent Flowsheet Documentation  Taken 06/26/2024 2045 by Peggie Hornak, Lorrayne E, RN  Infection Prevention:   cohorting utilized   equipment surfaces disinfected   environmental surveillance performed   hand hygiene promoted   personal protective equipment utilized   rest/sleep promoted   single patient room provided   visitors restricted/screened     Problem: Infection  Goal: Absence of Infection Signs and Symptoms  Intervention: Prevent or Manage Infection  Recent Flowsheet Documentation  Taken 06/26/2024 2045 by Corley Maffeo, Lorrayne E, RN  Infection Management: aseptic technique maintained  Isolation Precautions: protective precautions maintained     Problem: Stem Cell/Bone Marrow Transplant  Goal: Diarrhea Symptom Control  Intervention: Manage Diarrhea  Recent Flowsheet Documentation  Taken 06/26/2024 2045 by Adhrit Krenz E, RN  Skin Protection: adhesive use limited  Goal: Improved Activity Tolerance  Intervention: Promote Improved Energy  Recent Flowsheet Documentation  Taken 06/26/2024 2045 by Yoshiye Kraft E, RN  Activity Management: up ad lib  Goal: Blood Counts Within Acceptable Range  Intervention: Monitor and Manage Hematologic Symptoms  Recent Flowsheet Documentation  Taken 06/26/2024 2045 by Charonda Hefter E, RN  Bleeding Precautions:   blood pressure closely monitored   coagulation study results reviewed   foot protection facilitated   gentle oral care promoted   monitored for signs of bleeding  Goal: Absence of Infection  Intervention: Prevent and Manage Infection  Recent Flowsheet Documentation  Taken 06/26/2024 2045 by Cacie Gaskins E, RN  Infection Management: aseptic technique maintained  Infection Prevention:   cohorting utilized   equipment surfaces disinfected   environmental surveillance performed   hand hygiene promoted   personal protective equipment utilized   rest/sleep promoted   single patient room provided   visitors restricted/screened  Isolation Precautions: protective precautions maintained  Goal: Improved Oral Mucous Membrane Health and Integrity  Intervention: Promote Oral Comfort and Health  Recent Flowsheet Documentation  Taken 06/26/2024 2045 by Latangela Mccomas, Lorrayne E, RN  Oral Mucous Membrane Protection: nonirritating oral fluids promoted     Problem: Fall Injury Risk  Goal: Absence of Fall and Fall-Related Injury  Intervention: Promote Scientist, Clinical (histocompatibility And Immunogenetics) Documentation  Taken 06/26/2024 2045 by Symphanie Cederberg, Lorrayne E, RN  Safety Interventions:   aspiration precautions   bleeding precautions   fall reduction program maintained   isolation precautions   lighting adjusted for tasks/safety   low bed   neutropenic precautions   nonskid shoes/slippers when out of bed     Problem: Diabetes  Goal: Optimal Functional Ability  Intervention: Optimize Functional Ability  Recent Flowsheet Documentation  Taken 06/26/2024 2045 by Misael Mcgaha E, RN  Activity Management: up ad lib

## 2024-06-28 DIAGNOSIS — C9100 Acute lymphoblastic leukemia not having achieved remission: Principal | ICD-10-CM

## 2024-06-29 ENCOUNTER — Inpatient Hospital Stay: Admit: 2024-06-29 | Discharge: 2024-06-29 | Payer: PRIVATE HEALTH INSURANCE

## 2024-06-29 ENCOUNTER — Encounter: Admit: 2024-06-29 | Discharge: 2024-06-29 | Payer: PRIVATE HEALTH INSURANCE

## 2024-06-29 DIAGNOSIS — C9100 Acute lymphoblastic leukemia not having achieved remission: Principal | ICD-10-CM

## 2024-06-29 LAB — COMPREHENSIVE METABOLIC PANEL
ALBUMIN: 3.6 g/dL (ref 3.4–5.0)
ALKALINE PHOSPHATASE: 90 U/L (ref 46–116)
ALT (SGPT): 44 U/L (ref 10–49)
ANION GAP: 14 mmol/L (ref 5–14)
AST (SGOT): 35 U/L — ABNORMAL HIGH (ref <=34)
BILIRUBIN TOTAL: 0.3 mg/dL (ref 0.3–1.2)
BLOOD UREA NITROGEN: 7 mg/dL — ABNORMAL LOW (ref 9–23)
BUN / CREAT RATIO: 10
CALCIUM: 9.1 mg/dL (ref 8.7–10.4)
CHLORIDE: 105 mmol/L (ref 98–107)
CO2: 23 mmol/L (ref 20.0–31.0)
CREATININE: 0.67 mg/dL (ref 0.55–1.02)
EGFR CKD-EPI (2021) FEMALE: >90 mL/min/1.73m2 (ref >=60)
GLUCOSE RANDOM: 373 mg/dL — ABNORMAL HIGH (ref 70–179)
POTASSIUM: 4 mmol/L (ref 3.4–4.8)
PROTEIN TOTAL: 6.6 g/dL (ref 5.7–8.2)
SODIUM: 142 mmol/L (ref 135–145)

## 2024-06-29 LAB — SLIDE REVIEW

## 2024-06-29 LAB — CBC W/ AUTO DIFF
BASOPHILS ABSOLUTE COUNT: 0 10*9/L (ref 0.0–0.1)
BASOPHILS RELATIVE PERCENT: 0.7 %
EOSINOPHILS ABSOLUTE COUNT: 0 10*9/L (ref 0.0–0.5)
EOSINOPHILS RELATIVE PERCENT: 0.1 %
HEMATOCRIT: 29.5 % — ABNORMAL LOW (ref 34.0–44.0)
HEMOGLOBIN: 10.2 g/dL — ABNORMAL LOW (ref 11.3–14.9)
LYMPHOCYTES ABSOLUTE COUNT: 0.5 10*9/L — ABNORMAL LOW (ref 1.1–3.6)
LYMPHOCYTES RELATIVE PERCENT: 14.6 %
MEAN CORPUSCULAR HEMOGLOBIN CONC: 34.6 g/dL (ref 32.0–36.0)
MEAN CORPUSCULAR HEMOGLOBIN: 33 pg — ABNORMAL HIGH (ref 25.9–32.4)
MEAN CORPUSCULAR VOLUME: 95.4 fL (ref 77.6–95.7)
MEAN PLATELET VOLUME: 9.6 fL (ref 6.8–10.7)
MONOCYTES ABSOLUTE COUNT: 0.9 10*9/L — ABNORMAL HIGH (ref 0.3–0.8)
MONOCYTES RELATIVE PERCENT: 28 %
NEUTROPHILS ABSOLUTE COUNT: 1.8 10*9/L (ref 1.8–7.8)
NEUTROPHILS RELATIVE PERCENT: 56.6 %
PLATELET COUNT: 122 10*9/L — ABNORMAL LOW (ref 150–450)
RED BLOOD CELL COUNT: 3.09 10*12/L — ABNORMAL LOW (ref 3.95–5.13)
RED CELL DISTRIBUTION WIDTH: 14.9 % (ref 12.2–15.2)
WBC ADJUSTED: 3.3 10*9/L — ABNORMAL LOW (ref 3.6–11.2)

## 2024-06-29 LAB — MAGNESIUM: MAGNESIUM: 1.1 mg/dL — ABNORMAL LOW (ref 1.6–2.6)

## 2024-06-29 MED ADMIN — magnesium oxide (MAG-OX) tablet 400 mg: 400 mg | ORAL | @ 18:00:00

## 2024-06-29 MED ADMIN — magnesium sulfate 2gm/50mL IVPB: 2 g | INTRAVENOUS | @ 18:00:00

## 2024-06-29 NOTE — Progress Notes (Signed)
 Pt arrived to chair ambulatory in outpatient oncology infusion mag replacement per orders. Pt accessed via CVC, with positive blood return. Treatment completed, no concerns. Pt discharged ambulatory. AVS declined.

## 2024-06-29 NOTE — Patient Instructions (Signed)
 RED ZONE Means: RED ZONE: Take action now!     You need to be seen right away  Symptoms are at a severe level of discomfort    Call 911 or go to your nearest  Hospital for help     - Bleeding that will not stop    - Hard to breathe    - New seizure - Chest pain  - Fall or passing out  -Thoughts of hurting    yourself or others      Call 911 if you are going into the RED ZONE                  YELLOW ZONE Means:     Please call with any new or worsening symptom(s), even if not on this list.  Call [phone]  After hours, weekends, and holidays - you will reach a long recording with specific instructions, If not in an emergency such as above, please listen closely all the way to the end and choose the option that relates to your need.   You can be seen by a provider the same day through our Same Day Acute Care for Patients with Cancer program.      YELLOW ZONE: Take action today     Symptoms are new or worsening  You are not within your goal range for:    - Pain    - Shortness of breath    - Bleeding (nose, urine, stool, wound)    - Feeling sick to your stomach and throwing up    - Mouth sores/pain in your mouth or throat    - Hard stool or very loose stools (increase in       ostomy output)    - No urine for 12 hours    - Feeding tube or other catheter/tube issue    - Redness or pain at previous IV or port/catheter site    - Depressed or anxiety   - Swelling (leg, arm, abdomen,     face, neck)  - Skin rash or skin changes  - Wound issues (redness, drainage,    re-opened)  - Confusion  - Vision changes  - Fever >100.4 F or chills  - Worsening cough with mucus that is    green, yellow, or bloody  - Pain or burning when going to the    bathroom  - Home Infusion Pump Issue- call    [phone]         Call your healthcare provider if you are going into the YELLOW ZONE     GREEN ZONE Means:  Your symptoms are under controls  Continue to take your medicine as ordered  Keep all visits to the provider GREEN ZONE: You are in control  No increase or worsening symptoms  Able to take your medicine  Able to drink and eat    - DO NOT use MyChart messages to report red or yellow symptoms. Allow up to 3    business days for a reply.  -MyChart is for non-urgent medication refills, scheduling requests, or other general questions.         [id number] Rev. 01/28/2022  Approved by Oncology Patient Education Committee     Hospital Outpatient Visit on 06/29/2024   Component Date Value Ref Range Status    Sodium 06/29/2024 142  135 - 145 mmol/L Final    Potassium 06/29/2024 4.0  3.4 - 4.8 mmol/L Final    Chloride 06/29/2024 105  98 - 107  mmol/L Final    CO2 06/29/2024 23.0  20.0 - 31.0 mmol/L Final    Anion Gap 06/29/2024 14  5 - 14 mmol/L Final    BUN 06/29/2024 7 (L)  9 - 23 mg/dL Final    Creatinine 88/70/7974 0.67  0.55 - 1.02 mg/dL Final    BUN/Creatinine Ratio 06/29/2024 10   Final    eGFR CKD-EPI (2021) Female 06/29/2024 >90  >=60 mL/min/1.65m2 Final    eGFR calculated with CKD-EPI 2021 equation in accordance with [employer] and [employer] Task Force recommendations.    Glucose 06/29/2024 373 (H)  70 - 179 mg/dL Final    Calcium  06/29/2024 9.1  8.7 - 10.4 mg/dL Final    Albumin 88/70/7974 3.6  3.4 - 5.0 g/dL Final    Total Protein 06/29/2024 6.6  5.7 - 8.2 g/dL Final    Total Bilirubin 06/29/2024 0.3  0.3 - 1.2 mg/dL Final    AST 88/70/7974 35 (H)  <=34 U/L Final    ALT 06/29/2024 44  10 - 49 U/L Final    Alkaline Phosphatase 06/29/2024 90  46 - 116 U/L Final    Magnesium  06/29/2024 1.1 (L)  1.6 - 2.6 mg/dL Final    RBC 88/70/7974 3.09 (L)  3.95 - 5.13 10*12/L Preliminary    HGB 06/29/2024 10.2 (L)  11.3 - 14.9 g/dL Preliminary    HCT 88/70/7974 29.5 (L)  34.0 - 44.0 % Preliminary    MCV 06/29/2024 95.4  77.6 - 95.7 fL Preliminary    MCH 06/29/2024 33.0 (H)  25.9 - 32.4 pg Preliminary    MCHC 06/29/2024 34.6  32.0 - 36.0 g/dL Preliminary    RDW 88/70/7974 14.9  12.2 - 15.2 % Preliminary    MPV 06/29/2024 9.6  6.8 - 10.7 fL Preliminary    Platelet 06/29/2024 122 (L)  150 - 450 10*9/L Preliminary

## 2024-06-29 NOTE — Progress Notes (Signed)
 Brief Adult Oncology Infusion Clinic Note:  Electrolyte repletion    Paged for magnesium  1.1    Labs reviewed. Electrolyte therapy plan has been placed. Patient to receive 400 mg PO and 2G IV Magnesium   in the infusion clinic today. The infusion nurse has been notified.           Joesph Blumenthal, A-GNP  [facility]VANCED PRACTICE PROVIDER FOR INFUSION PROGRAM

## 2024-07-01 ENCOUNTER — Ambulatory Visit
Admit: 2024-07-01 | Discharge: 2024-07-01 | Payer: PRIVATE HEALTH INSURANCE | Attending: Pharmacist Clinician (PhC)/ Clinical Pharmacy Specialist | Primary: Pharmacist Clinician (PhC)/ Clinical Pharmacy Specialist

## 2024-07-01 ENCOUNTER — Ambulatory Visit
Admit: 2024-07-01 | Discharge: 2024-07-01 | Payer: PRIVATE HEALTH INSURANCE | Attending: Critical Care Medicine | Primary: Critical Care Medicine

## 2024-07-01 ENCOUNTER — Other Ambulatory Visit: Admit: 2024-07-01 | Discharge: 2024-07-01 | Payer: PRIVATE HEALTH INSURANCE

## 2024-07-01 DIAGNOSIS — C9100 Acute lymphoblastic leukemia not having achieved remission: Principal | ICD-10-CM

## 2024-07-01 DIAGNOSIS — Z9481 Bone marrow transplant status: Principal | ICD-10-CM

## 2024-07-01 LAB — CBC W/ AUTO DIFF
BASOPHILS ABSOLUTE COUNT: 0 10*9/L (ref 0.0–0.1)
BASOPHILS RELATIVE PERCENT: 1.1 %
EOSINOPHILS ABSOLUTE COUNT: 0 10*9/L (ref 0.0–0.5)
EOSINOPHILS RELATIVE PERCENT: 0.1 %
HEMATOCRIT: 28.2 % — ABNORMAL LOW (ref 34.0–44.0)
HEMOGLOBIN: 9.7 g/dL — ABNORMAL LOW (ref 11.3–14.9)
LYMPHOCYTES ABSOLUTE COUNT: 0.5 10*9/L — ABNORMAL LOW (ref 1.1–3.6)
LYMPHOCYTES RELATIVE PERCENT: 16 %
MEAN CORPUSCULAR HEMOGLOBIN CONC: 34.6 g/dL (ref 32.0–36.0)
MEAN CORPUSCULAR HEMOGLOBIN: 33.4 pg — ABNORMAL HIGH (ref 25.9–32.4)
MEAN CORPUSCULAR VOLUME: 96.6 fL — ABNORMAL HIGH (ref 77.6–95.7)
MEAN PLATELET VOLUME: 9.2 fL (ref 6.8–10.7)
MONOCYTES ABSOLUTE COUNT: 1.1 10*9/L — ABNORMAL HIGH (ref 0.3–0.8)
MONOCYTES RELATIVE PERCENT: 32.7 %
NEUTROPHILS ABSOLUTE COUNT: 1.7 10*9/L — ABNORMAL LOW (ref 1.8–7.8)
NEUTROPHILS RELATIVE PERCENT: 50.1 %
PLATELET COUNT: 134 10*9/L — ABNORMAL LOW (ref 150–450)
RED BLOOD CELL COUNT: 2.92 10*12/L — ABNORMAL LOW (ref 3.95–5.13)
RED CELL DISTRIBUTION WIDTH: 17.2 % — ABNORMAL HIGH (ref 12.2–15.2)
WBC ADJUSTED: 3.4 10*9/L — ABNORMAL LOW (ref 3.6–11.2)

## 2024-07-01 LAB — COMPREHENSIVE METABOLIC PANEL
ALBUMIN: 3.6 g/dL (ref 3.4–5.0)
ALKALINE PHOSPHATASE: 79 U/L (ref 46–116)
ALT (SGPT): 41 U/L (ref 10–49)
ANION GAP: 11 mmol/L (ref 5–14)
AST (SGOT): 34 U/L (ref <=34)
BILIRUBIN TOTAL: 0.2 mg/dL — ABNORMAL LOW (ref 0.3–1.2)
BLOOD UREA NITROGEN: 9 mg/dL (ref 9–23)
BUN / CREAT RATIO: 13
CALCIUM: 9 mg/dL (ref 8.7–10.4)
CHLORIDE: 106 mmol/L (ref 98–107)
CO2: 24 mmol/L (ref 20.0–31.0)
CREATININE: 0.69 mg/dL (ref 0.55–1.02)
EGFR CKD-EPI (2021) FEMALE: >90 mL/min/1.73m2 (ref >=60)
GLUCOSE RANDOM: 249 mg/dL — ABNORMAL HIGH (ref 70–179)
POTASSIUM: 4 mmol/L (ref 3.4–4.8)
PROTEIN TOTAL: 6.4 g/dL (ref 5.7–8.2)
SODIUM: 141 mmol/L (ref 135–145)

## 2024-07-01 LAB — SLIDE REVIEW

## 2024-07-01 LAB — MAGNESIUM: MAGNESIUM: 1.3 mg/dL — ABNORMAL LOW (ref 1.6–2.6)

## 2024-07-01 MED ORDER — LORAZEPAM 0.5 MG TABLET
ORAL_TABLET | Freq: Three times a day (TID) | ORAL | 0 refills | 4.00000 days | Status: CP | PRN
Start: 2024-07-01 — End: 2025-07-01
  Filled 2024-07-03: qty 12, 4d supply, fill #0

## 2024-07-01 NOTE — Progress Notes (Signed)
 BMT Clinic Follow-up    [name]  MRN: [id number]  Encounter date: 07/01/2024    Referring Physician: Estelle Toribio SAUNDERS, MD  BMT Attending MD: Dr. Jeaneen    Transplant Day: 20     Reason for Visit: Post allogeneic transplant follow up. This visit included intensive monitoring of high-risk medications/disease states given risk of severe myelosuppression, immunosuppression, infection, and other toxicities. Review included CBC, electrolytes and other blood tests, as well as direct assessment of the patient for symptoms suggesting toxicity.    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 Her transplant course was complicated by T2DM with steroid induced hyperglycemia (stable on current regimen at discharge), prolonged Qtc on admission (resolved with last Qtc 460 from 11/20), non-infectious diarrhea (resolved without recent need for imodium ). 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 Received most recent dose of IVIG on 11/26.     Interval History  Patricia [name] is a 48 y.o. 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      She is in the early post-transplant period, residing at [facility] for close monitoring. She is highly vigilant regarding infection risk, performing frequent temperature checks and adhering to infection prevention protocols, including mask use and environmental precautions. She is seeking clarification on criteria for discharge from [facility] and expresses concern about the timeline for returning home, which is approximately 45 minutes away.    She has engrafted with neutrophil recovery (ANC 1.7), though blood counts remain labile. She previously received two doses of GCSF in the hospital and is awaiting further chimerism testing to monitor engraftment.    She reports significant skin dryness and irritation, particularly on the scalp from tight caps, and uses Vaseline and oil-based moisturizers. She is vigilant about sun protection and aware of the risk for cutaneous GVHD. She denies current rash, GI symptoms, or liver dysfunction, and is prepared to report new symptoms promptly. She denies current mouth sores except for minor gum irritation after trauma, which resolved spontaneously.    She reports that her hair started falling out on Friday and she has had some itching on her scalp and also on her neck. No rash or itching otherwise. She reports an episode of nausea on Saturday following IV magnesium  infusion that resolved with a dose of zofran . No vomiting. She also reports several episodes of loose stools over the last few days. No belly pain or cramps and she has not been taking any loperamide . She denies any fevers, chills or other sick symptoms. Reports a headache last night that resolved with a dose of tramadol . Her blood sugars have averaged from 175 to 237 over the past few days. She continues taking her insulin  as prescribed. She continues holding losartan  as instructed. She reports that she took tacrolimus  this morning prior to lab draw in clinic but took note to hold it for subsequent visits. She is taking her medications as prescriptions as prescribed and needs refill on lorazepam .     She has a history of recurrent severe sinus infections since childhood, with significant sinus pain exacerbated by dry environments. She finds relief with Afrin nasal spray and has used tramadol  50 mg for sinus-related discomfort, which is effective. She cannot tolerate oxycodone  due to migraines. She denies current abdominal pain, fever, or other acute symptoms.    She is awaiting transition of her blood type  from O positive to O negative post-transplant.    She has multiple upcoming specialty appointments and has transitioned endocrinology care to [facility] for coordination. Her mother is present as caregiver, with plans for her best friend to assist subsequently.         Physical Exam:  BP 122/73  - Pulse 77  - Temp 36.8 ??C (98.2 ??F) (Oral)  - Resp 18  - Wt 78.3 kg (172 lb 9.9 oz)  - SpO2 99%  - BMI 27.74 kg/m??    70, Cares for self; unable to carry on normal activity or to do active work (ECOG equivalent 1)    General : No acute distress noted.   Central venous access: Line clean, dry, intact. No erythema or drainage noted.   ENT: Moist mucous membranes. Oropharhynx without lesions, erythema or exudate.   Cardiovascular: Pulse normal rate, regularity and rhythm. S1 and S2 normal, without any murmur, rub, or gallop.  Lungs: Clear to auscultation bilaterally, without wheezes/crackles/rhonchi. Good air movement.   Skin: Warm, dry, intact. No rash noted.   Psychiatry: Alert and oriented to person, place, and time.   Gastrointestinal/Abdomen: Normoactive bowel sounds, abdomen soft, non-tender   Musculoskeletal/Extremities: FROM throughout. No joint swelling, tenderness, or stiffness.   Neurologic: CNII-XII grossly intact.            Acute GVHD Assessment          Most Recent Value    07/02/2024 - 07/02/2024 Allogeneic BMT Unknown Phase - Planned 06/12/2024-Post 1 Year    07/02/2024 08:26   Acute GVHD Grade and Severity   Overall Grade (Przepiorka) 0  07/02/2024 0         Max Grade of acute GVHD post-HCT: Grade 0: No stage 1-4 of any organ  Max Grade of chronic GVHD post-HCT: Grade 0: No stage 1-4 of any organ    Assessment/Plan:  1) SCT Summary:  - Type of Transplant: RIC MUD Allo (Fludarabine  40 mg/m2/Melphalan  100  mg/m2)  - Graft Source: Fresh PBSCs10/10, Female, ABO O-, CMV negative     2) Disease: ALL  - Pre BMT disease status: CR MRD Negative  - Signs/symptoms of disease relapse: None   - Restaging plan:    *Next BMBX: Day +90   *Next Chimerism: Day +30    3) Immunosuppressed: High risk of infections, if patient develops a fever will admit to inpatient unit for IV antibiotics and fever workup   - Prophylaxis:          * Antiviral: Valtrex  500 mg po BID through 2 years post transplant.   Letermovir  480 mg po daily through day +200 to prevent CMV infections   * Antifungal: Fluconazole  400 mg po daily through day +75  * PJP: Sulfa allergic. Dapsone  daily upon platelet engraftment, has tolerated this well in the past. Toxo PCR negative. Start upon platelet engraftment.     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   Previously required Valcyte  treatment. 11/16 CMV viral load undetectable.  - Continue Letermovir  and Valtrex  BID through day +200 post SCT.     Recurrent Acute Sinusitis  Recurrent acute sinusitis with nasal congestion, frontal headache, and low-grade fever. Responded well to Augmentin .  -11/7 current sx resolving, started Augmentin  10 days (10/30-11/9)     Hypogammaglobulinemia   - Monthly IVIG - last on 05/16/2024   - Check weekly IgG levels   -  IgG 464 11/24. 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   - Prophylaxis:  - Methotrexate  5 mg/m2 IVP on days +1, +3, +6 and +11  - 1.Tacrolimus      * Goal trough: 5-10     * High risk drug monitoring and dose adjustments per BMT Pharmacist    * Signs/symptoms of toxicity: None      5) Anemia/Thrombocytopenia/Neutropenia Transfusion criteria: Transfuse 1 unit of PRBCs for hemoglobin <7 and 1 unit of platelets for Plt <10K or bleeding. [name] [name] does not have a history of transfusion reactions. Consent was obtained and documented on 06/05/24.     6) GI:   GERD prophylaxis: Pepcid  BID  Nausea:PRN zofran  and compazine   Diarrhea: PRN Imodium      7) Hypomagnesemia: Mag Chelate 2 tab BID    8)  Heart failure with mildly reduced ejection fraction (HFmrEF)   Followed by [facility]:  Due to anythracycline use  Hx of EF 45-50%, most recent Echo 05/23/24 improved to 55-60%  Hx of prolonged QTc   - Home med Losartan  50 mg daily (held 11/18)    9) Pulm/Allergies   - cetirizine  10 mg daily   - albuterol  PRN     10) Menses suppression: Lupron  last 07/26/22. 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 At home uses Dexcom for monitoring.  - Continue insulin  glargine 12u once daily  - Continue insulin  aspart with meals per carb counting (1 unit for every 8 grams of carbs)    12) Pain:  - PRN tramadol  50 mg. No oxy as this give patient migraines     13) Anxiety:  Lexapro  10 mg daily (has PRN ativan  available but has not needed)  Reconnected with her local therapist      14) DispoCaregivers: Lives in [place], [place]  Primary caregiver: Jeanie Fortenberry (mom)- first 30 days at [facility] then home if doing well will stay longer if medically needed  Secondary caregiver: Devaughn [name] (husband)    15) Bone Health: DEXA pending: 07/10/24    Wondering about line removal and going home. Insurance doesn't cover paying 75 a month.    Maralee PARAS Ellanor Feuerstein, FNP  [facility]    I personally spent 75 minutes face-to-face and non-face-to-face in the care of this patient, which includes all pre, intra, and post visit time on the date of service.  All documented time was specific to the E/M visit and does not include any procedures that may have been performed.  Time was spent on clinical assessment, placing orders / referrals, and/or prescription management, all pertaining to the diagnoses listed above.     Future Appointments   Date Time Provider Department Center   07/03/2024 10:00 AM Sheena Orren Liner, MD [facility]   07/03/2024 12:00 PM ONCBMT LABS HON[facility]   07/03/2024  1:00 PM ONCBMT APP A [facility]   07/05/2024  3:20 PM Storm Honer, MD [facility]   07/10/2024 11:00 AM [facility] DEXA RM 1 I[facility] [place]   07/10/2024 12:00 PM ONCBMT LABS HONCBMT [facility]   07/10/2024  1:00 PM [name], MD [facility]ONCB[facility]   07/11/2024  7:30 AM ONCINF CHAIR 14 HONC3UCA [facility]   08/08/2024  7:30 AM ONCINF CHAIR 18 HONC3UCA [facility]   08/13/2024  9:30 AM ONCBMT  LABS HON[facility]MT [facility]   08/13/2024 10:30 AM [name], MD HONCBMT [facility]   08/13/2024  1:25 PM Delores Fleeting, MD [facility]   08/27/2024  9:50 AM Kome, Arlean HERO, CPP [facility]   08/28/2024  9:40 AM Storm Honer, MD [facility]   09/10/2024  9:00 AM ONCBMT LABS HON[facility]   09/10/2024 10:00 AM ONCBMT APP A [facility]   09/10/2024 11:00 AM Polly Alyce Dover, MD ONCMULTI [facility]   09/10/2024 12:00 PM ONCBMT PROCEDURES HONC3UCA [facility]   09/17/2024  9:15 AM ONCBMT LABS HON[facility]MT [facility]   09/17/2024 10:00 AM [name], MD HONCBMT [facility]   09/24/2024  8:15 AM ONCBMT LABS HON[facility]MT [facility]   09/24/2024  9:00 AM ONCBMT APP A [facility]   09/24/2024 10:00 AM ONCBMT PHARMACY [facility]   09/24/2024 10:30 AM ONCBMT COORDINATOR [facility]ON[facility]   12/10/2024  9:30 AM ONCBMT LABS HON[facility]   12/10/2024 10:30 AM ONCBMT PHARMACY HON[facility]

## 2024-07-01 NOTE — Progress Notes (Signed)
Pharmacy Post-Discharge Assessment:  Ms. [name] is a 48 y.o. 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 Her transplant course was complicated by chemotherapy related pancytopenia, steroid exacerbated hyperglycemia, prolonged Qtc on admission (resolved with last Qtc 460 from 11/20), non-infectious diarrhea (resolved). 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    Interval History:  Ms [name] presents to clinic accompanied by her spouse and mother. She is overall feeling well. She reports that her hair started falling out on Friday and she has had some itching on her scalp and also on her neck. No rash or itching otherwise. She reports an episode of nausea on Saturday following IV magnesium  infusion that resolved with a dose of zofran . No vomiting. She also reports several episodes of loose stools over the last few days. No belly pain or cramps and she has not been taking any loperamide . She denies any fevers, chills or other sick symptoms. Reports a headache last night that resolved with a dose of tramadol . Her blood sugars have averaged from 175 to 237 over the past few days. She continues taking her insulin  as prescribed. Her is BP 122/73 in clinic today and she continues holding losartan  as instructed. She reports that she took tacrolimus  this morning prior to lab draw in clinic but took note to hold it for subsequent visits. She is taking her medications as prescriptions as prescribed and needs refill on lorazepam .     Hematology/Oncology History Overview Note   Referring/Local Oncologist:    Diagnosis:   10/29/2021  Bone marrow, left iliac, aspiration and biopsy  -  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 The majority of the abnormal cells (64/88) contained an additional BCR/ABL1 fusion signal, while 9/88 abnormal cells contained an additional ABL1 and ASS1 signal.       Genetics:   Karyotype/FISH:   RESULTS   Date Value Ref Range Status   10/28/2021   Final    NOTE: This report reflects a combined study from a peripheral blood and a bone marrow core biopsy. Eleven cells from the peripheral blood and nine cells from the bone marrow core biopsy were analyzed. The BCR/ABL1 FISH analysis was performed on the peripheral blood.     Abnormal Karyotype:  47,XX,t(9;22)(q34;q11.2),+der(22)t(9;22)[18]/46,XX[2]    Abnormal FISH:  A BCR/ABL1 interphase FISH assay showed a signal pattern consistent with a BCR::ABL1 rearrangement and the 9;22 translocation in 88% of the 100 cells scored. The majority of the abnormal cells (64/88) contained an additional BCR/ABL1 fusion signal, while 9/88 abnormal cells contained an additional ABL1 and ASS1 signal.           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  MRD by flow insufficient, p190 2/100,000  12/10/2021: Cycle 2 B cycle + rituximab   12/14/21: IT#4  12/17/21: IT#5  01/10/22: Cycle 3 A cycle + rituximab   01/11/22: IT #6  01/24/22: Bmbx - MRD-neg by PCR (p190)   02/17/22: C4 B cycle + ritux  02/18/22: IT#7  03/21/22: C5 A cycle + ritux (4th dose)  03/22/22: IT#8  04/29/22: Bmbx - MRD-neg by PCR (p190)   05/18/22: restart dasatinib  70mg  s/p neutropenia  05/26/22: Start maintenance vincristine  2mg , Prednisone  200mg  D1-5   06/02/22: IT #9  06/27/22: C2 Maintenance: vincristine  2mg , Prednisone  200mg  D1-5, HOLD dasatinib  due to neutropenia  07/04/22: RESTART dasatinib  70mg   07/21/22: IT #10  07/26/22: C3 Maintenance: vincristine  2mg , Prednisone  200mg  D1-5, dasatinib  70 mg  08/08/22: IT #11 - prophylaxis completed  08/23/22: C4 Maintenance: vincristine  2mg , Prednisone  200mg  D1-5, dasatinib  70 mg  09/20/22: C5 Maintenance: vincristine  2mg , Prednisone  200mg  D1-5, dasatinib  70 mg  10/04/22: Bone marrow biopsy - continued remission, MRD and BCR/ABL p190 negative  10/18/22: C6 Maintenance. ANC 0.4, HOLD dasatinib , continue vincristine  2mg  and Prednisone  200mg  D1-5  11/15/22: C7 Maintenance ANC 0.4. HOLD TKI, continue vincristine  2mg  and Prednisone  200mg  D1-5  ~11/30/22: Start ponatinib  15 mg daily  12/13/22: C8 Maintenance ANC 1.2. ponatinib  15mg , vincristine  2mg  D1 and Prednisone  200mg  D1-5  01/10/23: C9 Maintenance Anc 0.7. HOLD ponatinib , continue vincristine  2mg  D1 and Prednisone  200mg  D1-5  01/26/23: ANC 1.7, restart ponatinib  15 mg  02/08/23: C10 Maintenance. ANC is 1.4 - continue ponatinib   03/07/23: C11 Maintenance ANC is 1.1. continue ponatinib   04/06/23: C12 Maintenance  04/12/23: Bmbx - continued remission, MRD-negative, BCR/ABL undetectable  05/02/23: C13 Maintenance  05/30/23: C14 Maintenance  06/27/23: C15 Maintenance PB p210 2/100,000  07/23/23: C16 Maintenance PB p210 2/100,000  08/22/23: C17 Maintenance PB p210 4/100,000  09/19/23: C18 Maintenance PB p210 2/100,000  10/16/23: C19 Maintenance - HOLD prednisone  due to hyperglycemia  10/16/23: Bmbx - CR, MRD-neg by flow, p210 47/100,000   11/01/23: Increased ponatinib  to 30 mg daily  11/26/23: CNS relapse - IT chemo   11/30/23: IT chemo - blasts present   12/01/23: Bmbx - MRD+ disease, 0.14% by flow, p190 241/100,000   12/04/23: IT chemo - blasts present   12/07/23: IT chemo - blasts present   12/07/23: IT chemo - no definitive blasts - 1/8 clear   12/12/23: C1D1 Blinatumomab    12/11/23: IT chemo - no blasts - 2/8 clear  12/18/23: IT chemo - no blasts - 3/8 clear   12/27/23: IT chemo - no blasts - 4/8 clear   01/09/24: IT chemo - no blasts - 5/8 clear   01/15/24: Bmbx - MRD negative by flow and p190   01/24/24: IT chemo   01/25/24: Cycle 2 blinatumomab  + ponatinib  30mg  daily  02/22/24: IT chemo  03/06/24: IT chemo  03/07/24: Cycle 3 blinatumomab  + ponatinib  30 mg daily  03/07/24: PB p190 negative  04/04/24: IT chemo  04/11/24: IT chemo  04/18/24: Cycle 4 blinatumomab  + ponatinib  30 mg daily  04/18/24: PB p190 negative  05/21/24: Bmbx - MRD-neg by clonoseq, p190 negative      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)    (CMS-HCC)   10/29/2021 Initial Diagnosis    B-cell acute lymphoblastic leukemia (ALL) (CMS-HCC)     10/30/2021 - 03/30/2022 Chemotherapy    IP/OP LEUKEMIA GRAAPH-2005 < 60 YO (OP PEGFILGRASTIM  ON DAY 7)  [No description for this plan]  01/06/2022 - 04/07/2022 Endocrine/Hormone Therapy    OP LEUPROLIDE  (LUPRON ) 11.25 MG EVERY 3 MONTHS  Plan Provider: Toribio JONELLE Bunker, MD     05/26/2022 -  Chemotherapy    OP LEUKEMIA VINCRISTINE   vinCRIStine  2 mg IV on day 1     07/26/2022 - 07/26/2022 Endocrine/Hormone Therapy    OP LEUPROLIDE  (LUPRON ) 11.25 MG EVERY 3 MONTHS  Plan Provider: Toribio JONELLE Bunker, MD         Current Rx[1]    Assessment/Plan:    **BMT  - currently day +19 s/p RIC MUD allogeneic stem cell transplant for management of Ph+ B-cell ALL  - WBC: 3.4,  ANC 1.7, Hgb 9.7, PLT 134    **GVHD prophylaxis  - no sings or symptoms of GVHD during clinic visit   - started tacrolimus  on day -3 and received all 4 doses of methotrexate    - currently on tacrolimus  1.5 mg in AM and 2 mg in PM; target level 5-10 ng/ml. She took her AM dose prior to lab draw in clinic, so no level sent today. Plan to recheck at next clinic visit and patient aware to hold doses prior to lab draw in clinic     **ID  Prophylaxis:  - antibacterial - none indicated   - antifungal - fluconazole  400 mg PO QDay through day + 75  - antiviral - valacyclovir  500 mg po BID through at least day +365  - PJP - HOLDing dapsone  100 mg PO QDay - plan to restart later this week if counts stable and continue through completion of immunosuppression   - CMV - continue letermovir  480 mg po daily through day +200 given history of CMV viremia. Plan to also continue IVIG 0.4 g/kg monthly until IgG consistently above 400 without supplementation     **FENGI  - Recommend continuing famotidine  for 2 weeks following discharge.  At that time, Ms. Penafiel may transition to PRN use or DC if there is no ongoing need.   - N/V:  - Continue PRN use of  zofran  for nausea/vomiting  - Hypomagnesemia:              - Mg 1.3. Continue mag plus 2 tabs PO BID and CTM  - uses homemade supplements however counseled to hold during transplant     **Neuro/pain  - Continue PRN use of tramadol  for pain control  - Continue PTA escitalopram  10 mg daily   - Continue PTA ativan  Q8H PRN anxiety. 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 Continue holding losartan  50 mg once daily for now and CTM BP during clinic visits     **Pulm/Allergies   - cetirizine  10 mg daily   - albuterol  PRN     **MSK/Bone Health  - 30-day DEXA scan ordered on 07/10/24  - F/u results when available to determine need for zoledronic acid infusions    Pharmacy Information: Ms. [name] had prescriptions filled at [facility] (phone:[phone]). At the time of discharge, there were no issues with acquiring medications, and insurance coverage for all medications was adequate.    I provided Ms. Cruey with an updated MedActionPlan at this visit. I would be happy to see Ms. Newkirk in  the future as needed for further medication managment issues.    Medications prescribed or ordered upon discharge were reviewed today and reconciled with the most recent outpatient medication list.  Medication reconciliation was conducted by a clinical pharmacist    I spent 17 minutes in direct patient care with this patient    Thank you,    Elizebeth Has, PharmD, BCOP  Clinical Pharmacist Practitioner, BMTCT               [1]   Current Outpatient Medications   Medication Sig Dispense Refill    albuterol  HFA 90 mcg/actuation inhaler Inhale 2 puffs every six (6) hours as needed for wheezing. 8 g 1    blood sugar diagnostic (GLUCOSE BLOOD) Strp Use to check blood sugar two times a day. 100 each 0    blood-glucose meter kit Use to check blood sugar two times a day. Use as instructed. 1 each 0    blood-glucose sensor (DEXCOM G7 SENSOR) Devi 1 each by Miscellaneous route every ten (10) days. 9 each 3    cetirizine  (ZYRTEC ) 10 MG tablet Take 1 tablet (10 mg total) by mouth daily.      [Paused] dapsone  100 MG tablet Take 1 tablet (100 mg total) by mouth daily. TAKE 1 TABLET(100 MG) BY MOUTH DAILY 30 tablet 11    escitalopram  oxalate (LEXAPRO ) 10 MG tablet Take 1 tablet (10 mg total) by mouth daily. 90 tablet 2    famotidine  (PEPCID ) 20 MG tablet Take 1 tablet (20 mg total) by mouth two (2) times a day. 28 tablet 0    fluconazole  (DIFLUCAN ) 200 MG tablet Take 2 tablets (400 mg total) by mouth daily. 60 tablet 1    heparin , porcine, PF, 100 unit/mL Syrg Instill 5 mL in each of the 3 central line lumens three times per week. 180 mL 5    insulin  aspart (NOVOLOG  FLEXPEN) 100 unit/mL (3 mL) injection pen Give 1 unit of insulin  for every 8 grams of carbs.   Use up to 30 units/day, divided TID AC meals per carb counting.      insulin  glargine (LANTUS  SOLOSTAR U-100 INSULIN ) 100 unit/mL (3 mL) injection pen Inject 0.12 mL (12 Units total) under the skin daily.      lancets Misc Use to check blood sugar as directed 2 times a day & for symptoms of high or low blood sugar. 100 each 0    letermovir  (PREVYMIS ) 480 mg tablet Take 1 tablet (480 mg total) by mouth at bedtime. 28 tablet 5    LORazepam  (ATIVAN ) 0.5 MG tablet Take 1 tablet (0.5 mg total) by mouth every eight (8) hours as needed for anxiety. 6 tablet 0    [Paused] losartan  (COZAAR ) 50 MG tablet Take 1 tablet (50 mg total) by mouth in the morning. 100 tablet 3    magnesium  oxide-Mg AA chelate (MAGNESIUM  PLUS PROTEIN) 133 mg Take 2 tablets by mouth two (2) times a day. 100 tablet 2    ondansetron  (ZOFRAN ) 8 MG tablet Take 1 tablet (8 mg total) by mouth Three (3) times a day as needed. 30 tablet 2    tacrolimus  (PROGRAF ) 0.5 MG capsule Take 3 capsules (1.5 mg total) by mouth daily AND 4 capsules (2 mg total) nightly. 210 capsule 5    traMADol  (ULTRAM ) 50 mg tablet Take 1 tablet (50 mg total) by mouth every six (6) hours as needed for up to 7 days. 14 tablet 0    valACYclovir  (VALTREX ) 500 MG  tablet Take 1 tablet (500 mg total) by mouth two (2) times a day. 60 tablet 11     No current facility-administered medications for this visit.

## 2024-07-02 DIAGNOSIS — C9100 Acute lymphoblastic leukemia not having achieved remission: Principal | ICD-10-CM

## 2024-07-02 LAB — CMV DNA, QUANTITATIVE, PCR: CMV VIRAL LD: NOT DETECTED

## 2024-07-02 NOTE — Progress Notes (Signed)
 BMT Clinic Follow-up    [name]  MRN: [id number]  Encounter date: 07/03/2024    Referring Physician: Estelle Toribio SAUNDERS, MD  BMT Attending MD: Dr. Jeaneen    Transplant Day: 21     Reason for Visit: Post allogeneic transplant follow up. This visit included intensive monitoring of high-risk medications/disease states given risk of severe myelosuppression, immunosuppression, infection, and other toxicities. Review included CBC, electrolytes and other blood tests, as well as direct assessment of the patient for symptoms suggesting toxicity.    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 Her transplant course was complicated by T2DM with steroid induced hyperglycemia (stable on current regimen at discharge), prolonged Qtc on admission (resolved with last Qtc 460 from 11/20), non-infectious diarrhea (resolved without recent need for imodium ). 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 Received most recent dose of IVIG on 11/26.     Interval History  Patricia [name] is a 48 y.o. 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    - No nausea or vomiting.  - Having a BM each morning that is normally formed then later in the day can be mushy and rarely watery. Typically only goes 1-2 times per day. Has some cramps while going but no abdominal pain or cramping outside of BMs.   - Eating well. Yesterday she had chicken and dumplings, pork, green beans, cooked apples, creamed corn, dinner roll, pumpkin spice cake, waffles, as well as some snacks.    - Blood sugars in clinic have been 200-upper 300s. Reports BS this AM was 193 (alerted that BS was dropping too fast overnight so drank a strawberry milkshake). Did have a bagel prior to her visit today and BS in clinic is 382. She notes BS at home have been upper 100s typically. Currently will carb count and bolus insulin  prior to eating and takes 12 units of Lantus .   - Yesterday did not do as well with drinking, got around a liter but so far today has already had 1.5L of water.   - No rashes.   - No fevers, chills, or infections symptoms.     Physical Exam:  Wt Readings from Last 1 Encounters:   07/03/24 78.5 kg (173 lb 1 oz)     Temp Readings from Last 1 Encounters:   07/03/24 36.5 ??C (97.7 ??F) (Temporal)     BP Readings from Last 1 Encounters:   07/03/24 117/94     Pulse Readings from Last 1 Encounters:   07/03/24 99     SpO2 Readings from Last 1 Encounters:   07/03/24 100%       70, Cares for self; unable to carry on normal activity or to do active work (ECOG equivalent 1)    General: No acute distress noted.   Central venous access: Line clean, dry, intact. No erythema or drainage noted.   ENT: Moist mucous membranes. Oropharhynx without lesions, erythema or exudate.   Cardiovascular: Pulse normal rate, regularity and rhythm. S1 and S2 normal, without any murmur, rub, or gallop.  Lungs: Clear to auscultation bilaterally, without wheezes/crackles/rhonchi. Good air movement.   Skin: Warm, dry, intact. No rash noted.    Psychiatry: Alert and oriented to person, place, and time.   Gastrointestinal/Abdomen: Normoactive bowel sounds, abdomen soft, non-tender   Musculoskeletal/Extremities: FROM throughout. No edema  Neurologic: CNII-XII intact. Normal strength and sensation  throughout    Acute GVHD Assessment          Most Recent Value    07/02/2024 - 07/03/2024 Allogeneic BMT Unknown Phase - Planned 06/12/2024-Post 1 Year    07/02/2024 08:26 Allogeneic BMT Unknown Phase - Planned 06/12/2024-Post 1 Year    07/03/2024 18:56   Acute GVHD Grade and Severity   Overall Grade (Przepiorka) 0  07/03/2024 0 0     Max Grade of acute GVHD post-HCT: Grade 0: No stage 1-4 of any organ  Max Grade of chronic GVHD post-HCT: Grade 0: No stage 1-4 of any organ    Assessment/Plan:  1) SCT Summary:  - Type of Transplant: RIC MUD Allo (Fludarabine  40 mg/m2/Melphalan  100  mg/m2)  - Graft Source: Fresh PBSCs10/10, Female, ABO O-, CMV negative     2) Disease: ALL  - Pre BMT disease status: CR MRD Negative  - Signs/symptoms of disease relapse: None   - Restaging plan:    *Next BMBX: Day +90   *Next Chimerism: Day +30    3) Immunosuppressed: High risk of infections, if patient develops a fever will admit to inpatient unit for IV antibiotics and fever workup   - Prophylaxis:          * Antiviral: Valtrex  500 mg po BID through 2 years post transplant.   Letermovir  480 mg po daily through day +200 to prevent CMV infections   * Antifungal: Fluconazole  400 mg po daily through day +75  * PJP: Sulfa allergic. Dapsone  daily upon platelet engraftment, has tolerated this well in the past. Toxo PCR negative. Start upon platelet engraftment.    -12/3 Hgb has drifted down the past 2 visits. Will hold on starting Dapsone  just yet but if Hgb continues to drop by next week plan to given Pentamidine in stead reassess in a month     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   Previously required Valcyte  treatment. 11/16 CMV viral load undetectable.  - Continue Letermovir  and Valtrex  BID through day +200 post SCT.     Recurrent Acute Sinusitis  Recurrent acute sinusitis with nasal congestion, frontal headache, and low-grade fever. Responded well to Augmentin .  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   - Prophylaxis:  - Methotrexate  5 mg/m2 IVP on days +1, +3, +6 and +11  - 1.Tacrolimus      * Goal trough: 5-10     * High risk drug monitoring and dose adjustments per BMT Pharmacist    * Signs/symptoms of toxicity: None      5) Anemia/Thrombocytopenia:  - Transfusion criteria: Transfuse 1 unit of PRBCs for hemoglobin <7 and 1 unit of platelets for Plt <10K or bleeding. [name] [name] does not have a history of transfusion reactions. Consent was obtained and documented on 06/05/24.   - Anemia: Secondary to chemotherapy. Hgb slightly lower from labs on discharge but stable from last visit, continue to monitor.   - Thrombocytopenia: Secondary to chemotherapy. Plts stable in 120 range. Continue to monitor.     6) GI:   GERD prophylaxis: Pepcid  BID  Nausea:PRN zofran  and compazine , not currently needing.  Diarrhea: PRN Imodium , has not needed     7) Hypomagnesemia: Mag Chelate 2 tab BID, mg 1.2 today. She did not want to stay for IV magnesium  (reports getting nauseaous with the IV magneisum). WIll increase oral mag to 2 tbs TID. She is aware if mg level lower on Friday will need to stay for IV magnesium . Provided with magnesium  rich food list to try and increase magnesium  in her diet.     8)  Heart failure with mildly reduced ejection fraction (HFmrEF)   Followed by [facility]:  Due to anythracycline use  Hx of EF 45-50%, most recent Echo 05/23/24 improved to 55-60%  Hx of prolonged QTc   - Home med Losartan  50 mg daily (held 11/18)  -12/3: Diastolic BP mildly elevated. If persists will restart low dose losartan      9) Pulm/Allergies   - cetirizine  10 mg daily   - albuterol  PRN     10) Menses suppression: Lupron  last 07/26/22. No menses      11) Type 2 DM exacerbated by steroids: Followed by [facility]. At home uses Dexcom for monitoring. Has follow up with Endocrinology on 07/05/24.   - Increase insulin  glargine from 12u to 15u once daily  - Continue insulin  aspart with meals per carb counting (1 unit for every 8 grams of carbs)    12) Pain:  - PRN tramadol  50 mg. No oxy as this give patient migraines     13) Anxiety:  Lexapro  10 mg daily (has PRN ativan  available but has not needed)  Reconnected with her local therapist      14) DispoCaregivers: Lives in [place], [place]  Primary caregiver: Jeanie Warrior (mom)- first 30 days at [facility] then home if doing well will stay longer if medically needed  Secondary caregiver: Devaughn Ranger (husband)    15) Bone Health: DEXA pending: scheduled 07/09/24    Follow up:  -RTC on Friday as scheduled.  -RTC Tues/Friday next week. Tuesday will be her day +30 with Dr. Jeaneen Duwaine Abrahams, PA  [facility]    I personally spent 40 minutes face-to-face and non-face-to-face in the care of this patient, which includes all pre, intra, and post visit time on the date of service.  All documented time was specific to the E/M visit and does not include any procedures that may have been performed.  Time was spent on clinical assessment, placing orders / referrals, and/or prescription management, all pertaining to the diagnoses listed above.     Future Appointments   Date Time Provider Department Center   07/05/2024 11:15 AM ONCBMT LABS [facility]   07/05/2024 12:00 PM ONCBMT APP A [facility]   07/05/2024  3:20 PM Storm Honer, MD UN[facility]END[facility]   07/09/2024 11:30 AM [facility] DEXA RM 1 IDEXAUW [place]   07/09/2024 12:30 PM ONCBMT LABS [facility]   07/09/2024  1:30 PM [name], MD [facility]ONCBMT [facility]   07/11/2024  7:30 AM ONCINF CHAIR 14 [id number] [facility]   07/12/2024  2:30 PM ONCBMT LABS [facility]   07/12/2024  3:00 PM ONCBMT APP A [facility]   08/08/2024  7:30 AM ONCINF CHAIR 18 [id number] [facility]   08/13/2024  9:30 AM ONCBMT LABS [facility]ON[facility]   08/13/2024 10:30 AM [name], MD [facility]ON[facility]MT [facility]   08/13/2024  1:25 PM Delores Fleeting, MD [place] [facility]   08/27/2024  9:50 AM Sanda Arlean HERO, CPP [facility]ENDET [facility]   08/28/2024  9:40 AM Storm Honer, MD [facility]ABENDET [facility]   09/10/2024  9:00 AM ONCBMT LABS [facility]ON[facility]MT [facility]   09/10/2024 10:00 AM ONCBMT APP A [facility]   09/10/2024 11:00 AM Polly Alyce Dover, MD ONCMULTI [facility]   09/10/2024 12:00 PM ONCBMT PROCEDURES [id number] [facility]   09/17/2024  9:15 AM ONCBMT LABS HONCBMT [facility]   09/17/2024 10:00 AM [name], MD [facility]ONCBMT [facility]   09/17/2024 11:00 AM CARSER EKG [id number] [facility]   09/24/2024  8:15 AM ONCBMT LABS HONCBMT [facility]   09/24/2024  9:00 AM ONCBMT APP A [facility]   09/24/2024 10:00 AM ONCBMT PHARMACY [facility]   09/24/2024 10:30 AM ONCBMT COORDINATOR [facility]ON[facility]MT [facility]   12/10/2024  9:30 AM ONCBMT LABS [facility]ON[facility]MT [facility]   12/10/2024 10:30 AM ONCBMT PHARMACY HONCBMT [facility]

## 2024-07-02 NOTE — Patient Instructions (Signed)
 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 We discussed your progress, infection prevention, and the management of your immunosuppression and potential complications like graft-versus-host disease (GVHD).    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 Your blood counts are still fluctuating, and you are being closely monitored for complications.  -Continue monitoring blood counts three times weekly. We will reassess the frequency after December 10th.  -Monitor for relapse and post-transplant complications.  -Assess engraftment and chimerism with DNA fingerprinting/chimerism studies every 30 days.  -Continue monitoring for infectious complications and other post-transplant issues.    IMMUNOSUPPRESSION MANAGEMENT POST-ALLOGENEIC STEM CELL TRANSPLANT: You are on tacrolimus  to prevent GVHD and are at increased risk for infections and other complications. Your immune recovery is expected after stopping tacrolimus  around July/August.  -Continue tacrolimus  at the current dose, with adjustments based on trough levels and clinical status.  -Delay dapsone  until day 30 or until your cell counts stabilize.  -Continue current prophylactic antimicrobials.  -Follow detailed infection prevention guidelines, including avoiding crowded places, young children, and environmental exposures like gardening and new pets.  -Use N95 or KN95 masks in high-risk environments; regular masks if N95/KN95 are not tolerated.  -Avoid gardening and direct soil contact until off tacrolimus  and immune recovery. You can pick above-ground produce with double gloves and a mask.  -Use sun protection and oil-based moisturizers like Aquaphor or Eucerin to prevent skin dryness and potential GVHD.  -Use distilled water in humidifiers or an electric tea kettle for humidity.  -Avoid dental cleanings and elective dental procedures; coordinate emergent dental care with [facility] school if needed.  -Avoid pedicures at salons until off tacrolimus  and immune recovery; home foot care with caution is acceptable.  -Avoid new pets and exotic animals; contact with existing pets is acceptable with precautions.    MONITORING FOR ACUTE GRAFT VERSUS HOST DISEASE POST-ALLOGENEIC STEM CELL TRANSPLANT: You are at risk for acute GVHD, which can affect your skin, gastrointestinal tract, and liver. Early recognition and reporting of symptoms are crucial.  -Monitor for signs and symptoms of acute GVHD, such as skin rash, gastrointestinal symptoms, and liver dysfunction.  -Report any new rash, worsening gastrointestinal symptoms, or other concerning symptoms immediately.  -If a rash or other symptoms develop, we may perform a skin biopsy to confirm the diagnosis.  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  -Monitor liver function tests and gas  trointestinal symptoms at each visit; further workup may be needed if GVHD is suspected.  -Continue education on recognizing and promptly reporting GVHD symptoms.

## 2024-07-02 NOTE — Progress Notes (Signed)
 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    Assessment/Recommendations:    Patricia [name] is a 48 y.o. 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 She has remained on letermovir  for over a year and is now s/p allo-HCT. She will remain on letermovir  for 200 days as per protocol.     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  #Hemorrhoids + anal fissure 05/05/22, resolved    Active Infections  #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  -Max viral load 492 06/27/22 but ongoing fevers with minimal viremia  -CMV viremia resolved since 09/2022     Rx: 12/5-12/26/23 Valcyte  900mg  PO BID-->12/26-1/24/24 Valcyte  450mg  PO BID-->09/14/22 Valcyte  900mg  PO BID-->10/05/22 letermovir  (and valtrex )    Antimicrobial allergies/intolerances: Erythromycin, Azithro, Sulfa       RECOMMENDATIONS    Diagnostic  OK to perform routine CMV PCR testing    Treatment  Continue letermovir  and valtrex  through HCT and for at least 200 days after per protocol    Monitoring for antimicrobial toxicities  Patricia [name] is currently receiving drug therapy requiring intensive lab monitoring for toxicity.  CBC and CMP twice a month    Prophylaxis  Dapsone   Valtrex   Letermovir   Fluconazole      Follow up PRN          Recommendations were communicated via shared medical record.    History of Present Illness:      Source of information includes:  Electronic Medical Records and Discussion with patient     History of Present Illness  Patricia [name] is a 48 year old female who presents for follow-up after a transplant with a focus on CMV management.    She is undergoing follow-up care post-transplant with a focus on managing CMV. She has been receiving IVIG treatments, with the next dose scheduled for December 11th. Her last IgG level was 464 on November 23rd. No fevers, and she is vigilant about monitoring her temperature.    She is currently on prophylactic medications including Valtrex , Letermovir , and fluconazole . Dapsone  was temporarily discontinued.    She experiences a dry cough attributed to dry air in her living environment and has implemented a makeshift humidifier using an electric teapot to alleviate this. She reports occasional nausea, which is relieved by Zofran , and daily diarrhea, likely due to magnesium  supplementation, with bowel movements occurring once or twice daily and varying in consistency.    She has a history of cold sores, particularly during winter or stress, and wants to remain on Valtrex  indefinitely to prevent outbreaks. No current blisters or sores.    No issues with lightheadedness, swelling in her legs, or shortness of breath.     Allergies:  Allergies   Allergen Reactions    Other      Pt cant take ibuprofen due to condition.    Sulfa (Sulfonamide Antibiotics) Anaphylaxis     Other reaction(s): Not available  Azithromycin Hives     Per patient report    Erythromycin Hives     Other reaction(s): Not available    Clindamycin      Other Reaction(s): GI discomfort     Metoprolol Other (See Comments)     Other Reaction(s): Edema    Oxycodone  Headache     Migraines       Medications:     Current Outpatient Medications:     cetirizine  (ZYRTEC ) 10 MG tablet, Take 1 tablet (10 mg total) by mouth daily., Disp: , Rfl:     escitalopram  oxalate (LEXAPRO ) 10 MG tablet, Take 1 tablet (10 mg total) by mouth daily., Disp: 90 tablet, Rfl: 2    famotidine  (PEPCID ) 20 MG tablet, Take 1 tablet (20 mg total) by mouth two (2) times a day., Disp: 28 tablet, Rfl: 0    insulin  aspart (NOVOLOG  FLEXPEN) 100 unit/mL (3 mL) injection pen, Give 1 unit of insulin  for every 8 grams of carbs.  Use up to 30 units/day, divided TID AC meals per carb counting., Disp: , Rfl:     letermovir  (PREVYMIS ) 480 mg tablet, Take 1 tablet (480 mg total) by mouth at bedtime., Disp: 28 tablet, Rfl: 5    ondansetron  (ZOFRAN ) 8 MG tablet, Take 1 tablet (8 mg total) by mouth Three (3) times a day as needed., Disp: 30 tablet, Rfl: 2    tacrolimus  (PROGRAF ) 0.5 MG capsule, Take 3 capsules (1.5 mg total) by mouth daily AND 4 capsules (2 mg total) nightly., Disp: 210 capsule, Rfl: 5    traMADol  (ULTRAM ) 50 mg tablet, Take 1 tablet (50 mg total) by mouth every six (6) hours as needed for up to 7 days., Disp: 14 tablet, Rfl: 0    valACYclovir  (VALTREX ) 500 MG tablet, Take 1 tablet (500 mg total) by mouth two (2) times a day., Disp: 60 tablet, Rfl: 11    albuterol  HFA 90 mcg/actuation inhaler, Inhale 2 puffs every six (6) hours as needed for wheezing. (Patient not taking: Reported on 07/01/2024), Disp: 8 g, Rfl: 1    blood sugar diagnostic (GLUCOSE BLOOD) Strp, Use to check blood sugar two times a day., Disp: 100 each, Rfl: 0    blood-glucose meter kit, Use to check blood sugar two times a day. Use as instructed., Disp: 1 each, Rfl: 0    blood-glucose sensor (DEXCOM G7 SENSOR) Devi, 1 each by Miscellaneous route every ten (10) days., Disp: 9 each, Rfl: 3    [Paused] dapsone  100 MG tablet, Take 1 tablet (100 mg total) by mouth daily. TAKE 1 TABLET(100 MG) BY MOUTH DAILY (Patient not taking: Reported on 07/01/2024), Disp: 30 tablet, Rfl: 11    fluconazole  (DIFLUCAN ) 200 MG tablet, Take 2 tablets (400 mg total) by mouth daily., Disp: 60 tablet, Rfl: 1    heparin , porcine, PF, 100 unit/mL Syrg, Instill 5 mL in each of the 3 central line lumens three times per week., Disp: 180 mL, Rfl: 5    insulin  glargine (LANTUS  SOLOSTAR U-100 INSULIN ) 100 unit/mL (3 mL) injection pen, Inject 0.15 mL (15 Units total) under the skin daily., Disp: 15 mL, Rfl: 2    lancets Misc, Use to check blood sugar as directed 2 times a day & for symptoms of high or low blood sugar., Disp: 100 each, Rfl: 0    LORazepam  (ATIVAN ) 0.5 MG tablet, Take 1 tablet (0.5 mg total) by mouth every eight (8) hours as needed for anxiety., Disp: 12 tablet, Rfl: 0    [  Paused] losartan  (COZAAR ) 50 MG tablet, Take 1 tablet (50 mg total) by mouth in the morning. (Patient not taking: Reported on 07/01/2024), Disp: 100 tablet, Rfl: 3    magnesium  oxide-Mg AA chelate (MAGNESIUM  PLUS PROTEIN) 133 mg, Take 2 tablets by mouth Three (3) times a day., Disp: 200 tablet, Rfl: 2    Current antibiotics:  Letermovir    Valtrex   Dapsone  ppx  Fluconazole      Previous antibiotics:  Levo, Cefepime   Flucon while neutropenic  Valcyte      Current/Prior immunomodulators:  Ponatinib  + Blina    Other medications reviewed.       Review of Systems:  All other systems reviewed are negative.     Objective     Vital Signs:  BP 117/94  - Pulse 99  - Temp 36.5 ??C (97.7 ??F) (Temporal)  - Resp 17  - Wt 78.5 kg (173 lb 1 oz)  - SpO2 100%  - BMI 27.81 kg/m??     Physical Exam:     Const [x]  vital signs above    [x]  NAD, non-toxic appearance []  Chronically ill-appearing, non-distressed        Eyes [x]  Lids normal bilaterally, conjunctiva anicteric and noninjected OU     [] PERRL  [] EOMI        ENMT [x]  Normal appearance of external nose and ears, no nasal discharge        []  MMM, no lesions on lips or gums []  No thrush, leukoplakia, oral lesions  []  Dentition good []  Edentulous []  Dental caries present  []  Hearing normal  []  TMs with good light reflexes bilaterally         Neck [x]  Neck of normal appearance and trachea midline        []  No thyromegaly, nodules, or tenderness   []  Full neck ROM        Lymph []  No LAD in neck     []  No LAD in supraclavicular area []  No LAD in axillae   []  No LAD in epitrochlear chains     []  No LAD in inguinal areas        CV []  RRR            []  No peripheral edema     []  Pedal pulses intact   []  No abnormal heart sounds appreciated   []  Extremities WWP         Resp [x]  Normal WOB at rest    [x]  No breathlessness with speaking, no coughing  []  CTA anteriorly    []  CTA posteriorly          GI []  Normal inspection, NTND   []  NABS     []  No umbilical hernia on exam       []  No hepatosplenomegaly     []  Inspection of perineal and perianal areas normal        GU []  Normal external genitalia     [] No urinary catheter present in urethra   []  No CVA tenderness    []  No tenderness over renal allograft        MSK []  No clubbing or cyanosis of hands       []  No vertebral point tenderness  []  No focal tenderness or abnormalities on palpation of joints in RUE, LUE, RLE, or LLE        Skin [x]  No rashes, lesions, or ulcers of visualized skin     []  Skin warm and dry to palpation  Neuro [x]  Face expression symmetric  []  Sensation to light touch grossly intact throughout    []  Moves extremities equally    [x]  No tremor noted        []  CNs II-XII grossly intact     []  DTRs normal and symmetric throughout []  Gait unremarkable        Psych [x]  Appropriate affect       [x]  Fluent speech         [x]  Attentive, good eye contact  []  Oriented to person, place, time          [x]  Judgment and insight are appropriate         Tunneled line c/d/i    Labs:  Results in Past 30 Days  Result Component Current Result Ref Range Previous Result Ref Range   Absolute Eosinophils 0.0 (07/03/2024) 0.0 - 0.5 10*9/L 0.0 (07/01/2024) 0.0 - 0.5 10*9/L   Absolute Lymphocytes 0.5 (L) (07/03/2024) 1.1 - 3.6 10*9/L 0.5 (L) (07/01/2024) 1.1 - 3.6 10*9/L   Absolute Neutrophils 2.7 (07/03/2024) 1.8 - 7.8 10*9/L 1.7 (L) (07/01/2024) 1.8 - 7.8 10*9/L   Alkaline Phosphatase 77 (07/03/2024) 46 - 116 U/L 79 (07/01/2024) 46 - 116 U/L   ALT 35 (07/03/2024) 10 - 49 U/L 41 (07/01/2024) 10 - 49 U/L AST 25 (07/03/2024) <=34 U/L 34 (07/01/2024) <=34 U/L   BUN 12 (07/03/2024) 9 - 23 mg/dL 9 (87/03/7973) 9 - 23 mg/dL   Calcium  9.0 (07/03/2024) 8.7 - 10.4 mg/dL 9.0 (87/03/7973) 8.7 - 89.5 mg/dL   Creatinine 9.29 (87/12/7972) 0.55 - 1.02 mg/dL 9.30 (87/03/7973) 9.44 - 1.02 mg/dL   HGB 9.6 (L) (87/12/7972) 11.3 - 14.9 g/dL 9.7 (L) (87/03/7973) 88.6 - 14.9 g/dL   Magnesium  1.2 (L) (07/03/2024) 1.6 - 2.6 mg/dL 1.3 (L) (87/03/7973) 1.6 - 2.6 mg/dL   Phosphorus 4.5 (88/73/7974) 2.4 - 5.1 mg/dL 4.6 (88/76/7974) 2.4 - 5.1 mg/dL   Platelet 873 (L) (87/12/7972) 150 - 450 10*9/L 134 (L) (07/01/2024) 150 - 450 10*9/L   Potassium 4.8 (07/03/2024) 3.4 - 4.8 mmol/L 4.0 (07/01/2024) 3.4 - 4.8 mmol/L   Total Bilirubin <0.2 (L) (07/03/2024) 0.3 - 1.2 mg/dL 0.2 (L) (87/03/7973) 0.3 - 1.2 mg/dL   WBC 3.9 (87/12/7972) 3.6 - 11.2 10*9/L 3.4 (L) (07/01/2024) 3.6 - 11.2 10*9/L     Labs reviewed: ANC 2900, AST and ALT normal, creatinine normal 0.45 stable, ALC 1.3 stable    Microbiology:  CMV   05/13/22 318  05/18/22 ND  05/26/22 ND  06/02/22 670  06/06/22 223  06/14/22 156  06/22/22 230  06/27/22 492  07/04/22 294   12/5-12/26 Valcyte  900mg  PO BID  07/11/22 113  07/18/22 75  07/26/22 ND   12/26-1/24/24 Valcyte  450mg  PO BID  08/04/22 ND  08/08/22 <35  08/18/22 ND  08/23/22 ND  1//29/24 ND  09/05/22 59  09/12/22 408   09/14/22 Valcyte  900mg  PO BID  09/20/22 <35  09/26/22 <35  10/04/22 <35    10/10/22 147  3/19-->present ND    Imaging:  None new    Serologies:  Lab Results   Component Value Date    CMV IGG Positive (A) 05/29/2024    EBV VCA IgG Antibody Positive (A) 05/23/2024    Hep B Surface Ag Nonreactive 05/23/2024    Hep B S Ab Reactive (A) 11/25/2023    Hep B Surf Ab Quant 276.43 (H) 11/25/2023    Hep B Core Total Ab Reactive (A) 05/23/2024    Hepatitis C Ab Nonreactive 05/23/2024  HSV 1 IgG Positive (A) 05/23/2024    HSV 2 IgG Positive (A) 05/23/2024    Varicella IgG Positive 05/23/2024    VZV IgG s/co 14.7 05/23/2024    Toxoplasma Gondii IgG Negative 05/23/2024 Immunizations:  Immunization History   Administered Date(s) Administered    COVID-19 VACC,MRNA,(PFIZER)(PF) 02/20/2020, 03/12/2020    Covid-19 Vac, (7yr+) (Comirnaty) Mrna Pfizer  05/02/2023    TdaP 02/08/2017, 10/21/2021       Time spent on counseling/coordination of care: 10 Minutes  Total time spent with patient: 20 Minutes

## 2024-07-02 NOTE — Progress Notes (Signed)
 Outpatient BMT SW Note: SW called to check in with pt following her recent transplant discharge. Pt reports she is doing very well overall. She reports she is settling in at [facility] and while it is not home, it is going well.     SW offered support.    Pt denied needing any social work assistance today, but was appreciative of check in call.    SW encouraged her to call if any needs arise.

## 2024-07-03 ENCOUNTER — Ambulatory Visit: Admit: 2024-07-03 | Discharge: 2024-07-03 | Payer: PRIVATE HEALTH INSURANCE

## 2024-07-03 ENCOUNTER — Ambulatory Visit
Admit: 2024-07-03 | Discharge: 2024-07-03 | Payer: PRIVATE HEALTH INSURANCE | Attending: Infectious Disease | Primary: Infectious Disease

## 2024-07-03 ENCOUNTER — Other Ambulatory Visit: Admit: 2024-07-03 | Discharge: 2024-07-03 | Payer: PRIVATE HEALTH INSURANCE

## 2024-07-03 DIAGNOSIS — C9100 Acute lymphoblastic leukemia not having achieved remission: Principal | ICD-10-CM

## 2024-07-03 DIAGNOSIS — D696 Thrombocytopenia, unspecified: Principal | ICD-10-CM

## 2024-07-03 DIAGNOSIS — D649 Anemia, unspecified: Principal | ICD-10-CM

## 2024-07-03 DIAGNOSIS — D849 Immunodeficiency, unspecified: Principal | ICD-10-CM

## 2024-07-03 DIAGNOSIS — Z9481 Bone marrow transplant status: Principal | ICD-10-CM

## 2024-07-03 DIAGNOSIS — B259 Cytomegaloviral disease, unspecified: Principal | ICD-10-CM

## 2024-07-03 DIAGNOSIS — F32A Anxiety and depression: Principal | ICD-10-CM

## 2024-07-03 DIAGNOSIS — F419 Anxiety disorder, unspecified: Principal | ICD-10-CM

## 2024-07-03 LAB — COMPREHENSIVE METABOLIC PANEL
ALBUMIN: 3.4 g/dL (ref 3.4–5.0)
ALKALINE PHOSPHATASE: 77 U/L (ref 46–116)
ALT (SGPT): 35 U/L (ref 10–49)
ANION GAP: 10 mmol/L (ref 5–14)
AST (SGOT): 25 U/L (ref <=34)
BILIRUBIN TOTAL: <0.2 mg/dL — ABNORMAL LOW (ref 0.3–1.2)
BLOOD UREA NITROGEN: 12 mg/dL (ref 9–23)
BUN / CREAT RATIO: 17
CALCIUM: 9 mg/dL (ref 8.7–10.4)
CHLORIDE: 104 mmol/L (ref 98–107)
CO2: 26 mmol/L (ref 20.0–31.0)
CREATININE: 0.7 mg/dL (ref 0.55–1.02)
EGFR CKD-EPI (2021) FEMALE: >90 mL/min/1.73m2 (ref >=60)
GLUCOSE RANDOM: 382 mg/dL — ABNORMAL HIGH (ref 70–179)
POTASSIUM: 4.8 mmol/L (ref 3.4–4.8)
PROTEIN TOTAL: 6 g/dL (ref 5.7–8.2)
SODIUM: 140 mmol/L (ref 135–145)

## 2024-07-03 LAB — CBC W/ AUTO DIFF
BASOPHILS ABSOLUTE COUNT: 0 10*9/L (ref 0.0–0.1)
BASOPHILS RELATIVE PERCENT: 0.8 %
EOSINOPHILS ABSOLUTE COUNT: 0 10*9/L (ref 0.0–0.5)
EOSINOPHILS RELATIVE PERCENT: 0.1 %
HEMATOCRIT: 28.6 % — ABNORMAL LOW (ref 34.0–44.0)
HEMOGLOBIN: 9.6 g/dL — ABNORMAL LOW (ref 11.3–14.9)
LYMPHOCYTES ABSOLUTE COUNT: 0.5 10*9/L — ABNORMAL LOW (ref 1.1–3.6)
LYMPHOCYTES RELATIVE PERCENT: 12.5 %
MEAN CORPUSCULAR HEMOGLOBIN CONC: 33.5 g/dL (ref 32.0–36.0)
MEAN CORPUSCULAR HEMOGLOBIN: 32.7 pg — ABNORMAL HIGH (ref 25.9–32.4)
MEAN CORPUSCULAR VOLUME: 97.5 fL — ABNORMAL HIGH (ref 77.6–95.7)
MEAN PLATELET VOLUME: 9.8 fL (ref 6.8–10.7)
MONOCYTES ABSOLUTE COUNT: 0.7 10*9/L (ref 0.3–0.8)
MONOCYTES RELATIVE PERCENT: 18.4 %
NEUTROPHILS ABSOLUTE COUNT: 2.7 10*9/L (ref 1.8–7.8)
NEUTROPHILS RELATIVE PERCENT: 68.2 %
PLATELET COUNT: 126 10*9/L — ABNORMAL LOW (ref 150–450)
RED BLOOD CELL COUNT: 2.93 10*12/L — ABNORMAL LOW (ref 3.95–5.13)
RED CELL DISTRIBUTION WIDTH: 18.8 % — ABNORMAL HIGH (ref 12.2–15.2)
WBC ADJUSTED: 3.9 10*9/L (ref 3.6–11.2)

## 2024-07-03 LAB — MAGNESIUM: MAGNESIUM: 1.2 mg/dL — ABNORMAL LOW (ref 1.6–2.6)

## 2024-07-03 LAB — IGG: GAMMAGLOBULIN; IGG: 890 mg/dL (ref 650–1600)

## 2024-07-03 MED ORDER — LANTUS SOLOSTAR U-100 INSULIN 100 UNIT/ML (3 ML) SUBCUTANEOUS PEN
Freq: Every day | SUBCUTANEOUS | 2 refills | 100.00000 days | Status: CP
Start: 2024-07-03 — End: ?
  Filled 2024-07-05: qty 15, 100d supply, fill #0

## 2024-07-03 MED ORDER — MAGNESIUM OXIDE-MAGNESIUM AMINO ACID CHELATE 133 MG TABLET
ORAL_TABLET | Freq: Three times a day (TID) | ORAL | 2 refills | 34.00000 days | Status: CP
Start: 2024-07-03 — End: ?

## 2024-07-03 NOTE — Progress Notes (Signed)
 BMT Clinic Follow-up    [name]  MRN: [id number]  Encounter date: 07/05/2024    Referring Physician: Estelle Toribio SAUNDERS, MD  BMT Attending MD: Dr. Jeaneen    Transplant Day: 21     Reason for Visit: Post allogeneic transplant follow up. This visit included intensive monitoring of high-risk medications/disease states given risk of severe myelosuppression, immunosuppression, infection, and other toxicities. Review included CBC, electrolytes and other blood tests, as well as direct assessment of the patient for symptoms suggesting toxicity.    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 Her transplant course was complicated by T2DM with steroid induced hyperglycemia (stable on current regimen at discharge), prolonged Qtc on admission (resolved with last Qtc 460 from 11/20), non-infectious diarrhea (resolved without recent need for imodium ). 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 Received most recent dose of IVIG on 11/26.     Interval History  Patricia [name] is a 48 y.o. 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    - Felt nauseous this AM and took a dose of zofran , helped a little but didn't alleviate the nausea. She ate 2 pieces of toast and then had a 3rd bowel movement and nausea resolved. No vomiting.   - Has had 3 BM so far today. Started as formed then by the third BM stools were mushy. Denies any watery BMs. Typically having 3-4 BMs per day.   - She did not bolus any insulin  prior to her toast this morning and her BS in clinic is 338. Continue to remain above goal, increased lantus  to 15 units on 12/3. She is seeing enocrinology today for blood sugar management.   - Has itching around base of neck and arms but no rash. Has some dry skin on arms and around lips. Using moisturizing lotion to dry patches.   - Pushing fluids and kidney function is stable.   - No fevers, chills, or infectious symptoms. Has chronic sinus pain, worsens with dry air and mask wearing but overall stable. Denies congestion or cough.       Physical Exam:  Wt Readings from Last 1 Encounters:   07/03/24 78.5 kg (173 lb 1 oz)     Temp Readings from Last 1 Encounters:   07/03/24 36.5 ??C (97.7 ??F) (Temporal)     BP Readings from Last 1 Encounters:   07/03/24 117/94     Pulse Readings from Last 1 Encounters:   07/03/24 99     SpO2 Readings from Last 1 Encounters:   07/03/24 100%       70, Cares for self; unable to carry on normal activity or to do active work (ECOG equivalent 1)    General: No acute distress noted.   Central venous access: Line clean, dry, intact. No erythema or drainage noted.   ENT: Moist mucous membranes. Oropharhynx without lesions, erythema or exudate.   Cardiovascular: Pulse normal rate, regularity and rhythm. S1 and S2 normal, without any murmur, rub, or gallop.  Lungs: Clear to auscultation bilaterally, without wheezes/crackles/rhonchi. Good air movement.   Skin: Warm, dry, intact. No rash noted.    Psychiatry: Alert and oriented to person, place, and time.   Gastrointestinal/Abdomen: Normoactive bowel sounds, abdomen soft, non-tender   Musculoskeletal/Extremities: FROM throughout. No edema  Neurologic: CNII-XII intact. Normal strength and sensation throughout      Acute GVHD Assessment  Most Recent Value    07/02/2024 - 07/05/2024 Allogeneic BMT Unknown Phase - Planned 06/12/2024-Post 1 Year    07/02/2024 08:26 Allogeneic BMT Unknown Phase - Planned 06/12/2024-Post 1 Year    07/03/2024 18:56 Allogeneic BMT Unknown Phase - Planned 06/12/2024-Post 1 Year    07/05/2024 18:18   Acute GVHD Grade and Severity   Overall Grade (Przepiorka) 0  07/05/2024 0 0 0     Max Grade of acute GVHD post-HCT: Grade 0: No stage 1-4 of any organ  Max Grade of chronic GVHD post-HCT: Grade 0: No stage 1-4 of any organ    Assessment/Plan:  1) SCT Summary:  - Type of Transplant: RIC MUD Allo (Fludarabine  40 mg/m2/Melphalan  100  mg/m2)  - Graft Source: Fresh PBSCs10/10, Female, ABO O-, CMV negative     2) Disease: ALL  - Pre BMT disease status: CR MRD Negative  - Signs/symptoms of disease relapse: None   - Restaging plan:    *Next BMBX: Day +90   *Next Chimerism: Day +30    3) Immunosuppressed: High risk of infections, if patient develops a fever will admit to inpatient unit for IV antibiotics and fever workup   - Prophylaxis:          * Antiviral: Valtrex  500 mg po BID through 2 years post transplant.   Letermovir  480 mg po daily through day +200 to prevent CMV infections   * Antifungal: Fluconazole  400 mg po daily through day +75  * PJP: Sulfa allergic. Dapsone  daily upon platelet engraftment, has tolerated this well in the past. Toxo PCR negative. Start upon platelet engraftment.    -12/5: Hgb improved, will start Dapsone  100mg  daily.     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   Previously required Valcyte  treatment. 11/16 CMV viral load undetectable.  - Continue Letermovir  and Valtrex  BID through day +200 post SCT.     Recurrent Acute Sinusitis  Recurrent acute sinusitis with nasal congestion, frontal headache, and low-grade fever. Responded well to Augmentin .  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   - Prophylaxis:  - Methotrexate  5 mg/m2 IVP on days +1, +3, +6 and +11  - 1.Tacrolimus      * Goal trough: 5-10     * High risk drug monitoring and dose adjustments per BMT Pharmacist    * Signs/symptoms of toxicity: None      5) Anemia/Thrombocytopenia:  - Transfusion criteria: Transfuse 1 unit of PRBCs for hemoglobin <7 and 1 unit of platelets for Plt <10K or bleeding. [name] does not have a history of transfusion reactions. Consent was obtained and documented on 06/05/24.   - Anemia: Secondary to chemotherapy. Hgb slightly lower from labs on discharge but stable from last visit, continue to monitor.   - Thrombocytopenia: Secondary to chemotherapy. Plts stable in 120 range. Continue to monitor.     6) GI:   GERD prophylaxis: Pepcid  BID  Nausea:PRN zofran  and compazine , not currently needing.   Diarrhea: PRN Imodium , has not needed     7) Hypomagnesemia:   - Mg is 1.2 (stable from last visit). She just increased her magnesium  to  2 tabs TID yesterday Discussed IV magnesium  today but she is unable to stay for magnesium  due to her appointment at [facility] with endocrinology. Trying to be cautious with oral magnesium  increase given it upsets her stomach. Will increase magnesium  to 2 tabs in the AM, 2 tabs in the aternoon, and 3 tabs in the PM. She will return on Sunday for a lab check and magnesium replacement if needed. She gets nauseous with IV magnesium so will need to run 2g over 2 hours whenever she needs IV magnesium.     8)  Heart failure with mildly reduced ejection fraction (HFmrEF)   Followed by [facility]:  Due to anythracycline use  Hx of EF 45-50%, most recent Echo 05/23/24 improved to 55-60%  Hx of prolonged QTc   - Home med Losartan 50 mg daily (held 11/18)  - BP stable, continue to omnitor.     9) Pulm/Allergies   - cetirizine 10 mg daily   - albuterol PRN     10) Menses suppression: Lupron last 07/26/22. No menses      11) Type 2 DM exacerbated by steroids: Followed by [facility]. At home uses Dexcom for monitoring. Has follow up with Endocrinology on 07/05/24.   - Increase insulin glargine from 12u to 15u once daily  - Continue insulin aspart with meals per carb counting (1 unit for every 8 grams of carbs)    12) Pain:  - PRN tramadol 50 mg. No oxy as this give patient migraines     13) Anxiety:  Lexapro 10 mg daily (has PRN ativan available but has not needed)  Reconnected with her local therapist      14 ) DispoCaregivers: Lives in [place], [place]  Primary caregiver: Jeanie Tate (mom)- first 30 days at [facility] then home if doing well will stay longer if medically needed  Secondary caregiver: Moriah Shawley (husband)    15) Bone Health: DEXA pending: scheduled 07/09/24    Follow up:  -Return to infusion Sunday for lab check and possible IV magnesium   - RTC Tuesday to see Dr. Jeaneen Duwaine Abrahams, PA  [facility]    I personally spent 47 minutes face-to-face and non-face-to-face in the care of this patient, which includes all pre, intra, and post visit time on the date of service.  All documented time was specific to the E/M visit and does not include any procedures that may have been performed.  Time was spent on clinical assessment, placing orders / referrals, and/or prescription management, all pertaining to the diagnoses listed above.     Future Appointments   Date Time Provider Department Center   07/05/2024 11:15 AM ONCBMT LABS [facility]   07/05/2024 12:00 PM ONCBMT APP A [facility]   07/05/2024  3:20 PM Storm Honer, MD [facility]END[facility]   07/09/2024 11:30 AM [facility] DEXA RM 1 IDEXAU[facility] [place]   07/09/2024 12:30 PM ONCBMT LABS [facility]   07/09/2024  1:30 PM [name], MD [facility]ON[facility]   07/11/2024  7:30 AM ONCINF CHAIR 14 HONC3UCA [facility]   07/12/2024  2:30 PM ONCBMT LABS [facility]   07/12/2024  3:00 PM ONCBMT APP A [facility]   08/08/2024  7:30 AM ONCINF CHAIR 18 HONC3UCA [facility]   08/13/2024  9:30 AM ONCBMT LABS [facility]   08/13/2024 10:30 AM [name], MD [facility]   08/13/2024  1:25 PM Delores Fleeting, MD UNCHRTVASET [facility]   08/27/2024  9:50 AM Kome, Arlean HERO, CPP [facility]END[facility]   08/28/2024  9:40 AM Storm Honer, MD [facility]END[facility]   09/10/2024  9:00 AM ONCBMT LABS [facility]   09/10/2024 10:00 AM ONCBMT APP A [facility]   09/10/2024 11:00 AM Polly Alyce Dover, MD ONCMULTI [facility]   09/10/2024 12:00 PM ONCBMT PROCEDURES HONC3UCA [facility]   09/17/2024  9:15 AM ONCBMT LABS [facility]   09/17/2024 10:00 AM [name], MD [facility]ON[facility]   09/17/2024 11:00 AM CARSER EKG HVCARD2UMH [facility]   09/24/2024  8:15 AM ONCBMT LABS [facility]   09/24/2024  9:00 AM ONCBMT APP A [facility]   09/24/2024 10:00 AM ONCBMT PHARMACY [facility]   09/24/2024 10:30 AM ONCBMT COORDINATOR [facility]   12/10/2024  9:30 AM ONCBMT LABS [facility]   12/10/2024 10:30 AM ONCBMT PHARMACY [facility]

## 2024-07-03 NOTE — Patient Instructions (Signed)
 It was great seeing you today.     Please increase your magnesium  to 2 tablets three times a day. You can also increase your magnesium  in your diet.     Let's increase lantus  slightly to 15 units.     Please hold your tacrolimus  on Friday.   -------------------------------------------------------------------------------  Return to clinic on Friday to see one of the providers. You will receive a time when you check out today.    Lab Results   Component Value Date    WBC 3.9 07/03/2024    HGB 9.6 (L) 07/03/2024    HCT 28.6 (L) 07/03/2024    PLT 126 (L) 07/03/2024     Lab Results   Component Value Date    NA 140 07/03/2024    K 4.8 07/03/2024    CL 104 07/03/2024    CO2 26.0 07/03/2024    BUN 12 07/03/2024    CREATININE 0.70 07/03/2024    GLU 382 (H) 07/03/2024    CALCIUM  9.0 07/03/2024    MG 1.2 (L) 07/03/2024    PHOS 4.5 06/26/2024     Lab Results   Component Value Date    BILITOT <0.2 (L) 07/03/2024    BILIDIR 0.10 06/26/2024    PROT 6.0 07/03/2024    ALBUMIN 3.4 07/03/2024    ALT 35 07/03/2024    AST 25 07/03/2024    ALKPHOS 77 07/03/2024    GGT 836 (H) 11/25/2023     Lab Results   Component Value Date    INR 1.15 06/26/2024    APTT 29.0 06/26/2024       For prescription refills:   For refills, please check your medication bottles to see if you have additional refills left. If so, please call your pharmacy and follow the directions to request a refill. If you do not have any refills left, please make a request during your clinic visit or by submitting a request through MyChart or by calling [phone]. Please allow 24 hours if your request is made during the week or 48 hours if requests are made on the weekends or holidays.     --------------------------------------------------------------------------------------------------------------------  For appointments & questions Monday through Friday 8 AM-4:30 PM     Please call [phone]     On Nights, Weekends and [place] [phone]. This is the nurse's station. They will contact the provider for you.     Please visit Privacyfe[employer]cz, a resource created just for family members and caregivers.  This website lists support services, how and where to ask for help. It has tools to assist you as you help us  care for your loved one.    [facility]  [address]  [place], [place] [address]  [email or web address]

## 2024-07-04 DIAGNOSIS — C9100 Acute lymphoblastic leukemia not having achieved remission: Principal | ICD-10-CM

## 2024-07-04 MED ORDER — FAMOTIDINE 20 MG TABLET
ORAL_TABLET | Freq: Two times a day (BID) | ORAL | 1 refills | 30.00000 days | Status: CP
Start: 2024-07-04 — End: ?

## 2024-07-04 NOTE — Progress Notes (Signed)
 Endocrinology Clinic Visit Note    ASSESSMENT AND PLAN:     Patricia [name] is a 48 y.o. female with a history significant for B-ALL dx 2023, HTN, anxiety who is seen in consultation today at the request of   for recommendations regarding diabetes.    Type 2 diabetes mellitus 2/2 steroid induced hyperglycemia, controlled, with long-term current use of insulin    Goal A1c < 7% without hypoglycemia; today 6.2% but GMI 8.5%  Dapsone  known to falsely lower A1c  Recent SCT also likely lowering A1c falsely  Current medications: Lantus  15 (past 2 days), ICR 8, CF 1:50 >150  More than 72 hours of CGM data was reviewed, interpreted and summarized with the patient.  My interpretation is type 2 diabetes, uncontrolled.  Time in range 35%, not at goal of 50% or greater.  Time in level 1 and level 2 hyperglycemia above goal as well.  In review specific trends, overnight at Lantus  15 units, seems that the patient has stable glycemia.  However during the day she has postprandial excursions.  She does state that she is working on bolusing prior to meal intake however these glycemic excursions are still little quite significant.  I would suggest improving her prandial glycemic control through combination of a more aggressive correction factor and more aggressive insulin  to carb ratio.  To decrease insulin  needs and utilize medications that may have other protective benefits, will reinstate metformin  at a low dose.  To improve glycemic control and per patient preference, will ask that she be seen by our diabetes educators for consideration of OmniPod start.  She is highly engaged with her care and technologically well versed .  I believe that she would engage appropriately with her pump and utilize it to its full extent.  PLAN  Strengthen ICR from 1: 8 to 1: 6  Strengthened the carb ratio to 1: 30 > 150  Continue Lantus  15 units daily  Start metformin  XR 500 mg daily  Close follow-up with CDE in 2 months for pump start and CGM check        Diabetes-Related Health Maintenance  Hypertension: goal <130/80; today 116/78  Hyperlipidemia: LDL 129 11/21/2023  Retinopathy: unknown, no visual insurance at this point. 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    Patient was staffed with Dr. Eugenio.    [name], MD   PGY4 Fellow in Endocrinology and Metabolism  [employer]       SUBJECTIVE:     LV:  03/22/2024 with myself to establish care. Recommended weight based dosing of insulin  as follows: Lantus  22 units every day, Novolog  8 units TID AC, sliding scale of 1:50 >150.    Since that time, hospitalized 11/7-11/27/2025 - received allogenic stem cell transplant. Steroid induced hyperglycemia during hospitalization which resolved by the time of discharge. Insulin  regimen recommended as follows by primary team: Lantus  12 units nightly, aspart ICR 1:8, CF 1:20.    Today:  Patricia JINNY  [name] is a 48 year old female with diabetes who presents for management of her blood glucose levels.    She has been experiencing difficulties managing her diabetes following a recent hospitalization, where her blood glucose management was disrupted due to an inability to administer insulin  before meals. Since discharge, she has been working to re-establish her routine.    At hospital discharge, Lantus  reduced to 12 units but later increased to 15 units by bone marrow team as she was experiencing fasting hyperglycemia.  Previously, she was on 25 units, which occasionally caused nocturnal hypoglycemia, with blood glucose dropping to 70 mg/dL or lower, sometimes as low as 47 mg/dL, particularly after misjudging her insulin  needs relative to her food intake.    She describes an incident on Monday where she experienced hypoglycemia after consuming tacos, which had fewer carbohydrates than anticipated. She attempted to correct this with additional food intake, but it was insufficient. She has been on 15 units of long-acting insulin  for the past two to three days.    Her current meal routine includes three meals a day, with breakfast around 9 AM, lunch when hungry, and dinner at 6 PM. She reports a recent episode where her blood glucose spiked to over 300 mg/dL after consuming two pieces of toast, which she did not cover with insulin  due to feeling unwell. Her current short-acting insulin  regimen involves a carb ratio of 8, with a correction factor of 1 unit for every 50 mg/dL over 849 mg/dL. Despite this, she feels her current insulin  regimen may not be sufficient, as her blood glucose remains elevated post-meal.    History of Present Illness:  Patricia [name] is a 48 y.o. female who is seen in consultation today at the request of   for evaluation of diabetes.    The patient has historically followed with [facility] for treatment of B-ALL. Treatment has included multiple lines of therapy, including rituximab , dasatinib , vincristine , prednisone , ponatinib , blinatumomab , and intrathecal chemotherapy. 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 Unfortunately progressed on therapy, experiencing CNS recurrence of disease. Now in molecular remission. Began to experience more frequent hyperglycemia in December 2024-January 2025. Now s/p SCT 06/2024.    Exercise:  Household exercise    Blood sugar monitoring:  CGM    Type 2 diabetes mellitus history  Dx: 09/2023  Previously followed: [facility]  Past treatments: metformin  500 mg every day, Novolog , Lantus   Hypoglycemia unawareness? Intact, reports hypoglycemia in past  DKA? 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     Medical History Surgical History   Past Medical History[1]   Past Surgical History[2]       Social History Family History   Social History     Tobacco Use    Smoking status: Former     Current packs/day: 0.00     Types: Cigarettes     Quit date: 11/12/2015     Years since quitting: 8.6    Smokeless tobacco: Never   Substance Use Topics  Alcohol use: Not Currently      Family History[3]       Medications   Current Medications[4]         Allergies   Allergies[5]         OBJECTIVE:     Physical Exam:  There were no vitals taken for this visit.    Wt Readings from Last 5 Encounters:   07/03/24 78.5 kg (173 lb 1 oz)   07/01/24 78.3 kg (172 lb 9.9 oz)   06/26/24 79.3 kg (174 lb 13.2 oz)   06/05/24 79.7 kg (175 lb 11.3 oz)   05/29/24 78.8 kg (173 lb 11.6 oz)        General: female in no apparent distress  HEENT: EOMI, sclera anicteric, no exophthalmos, no cushingoid features  Respiratory: No increased work of breathing on RA  Neuro: Awake, alert, and oriented  Psych: Normal mood and affect  Skin: No abnormal skin pigmentation    Labs:  I personally reviewed labs available in Epic prior to the start of today's visit.     Latest Reference Range & Units 11/15/22 09:24 10/17/23 09:35 03/22/24 13:48   Hemoglobin A1c 4.8 - 5.6 % <3.8 (L) 7.1 (H)    HGB A1C, RAP/PDS <7.0 %   5.0   (L): Data is abnormally low  (H): Data is abnormally high  A1c 02/24/2024 5.2% via Care Everywhere Latest Reference Range & Units 11/21/23 13:35   Cholesterol, Total <200 mg/dL 769 (H)   Triglycerides <150 mg/dL 638 (H)   HDL >49 mg/dL 39 (L)   LDL calculated <100 mg/dL 870 (H)   Non-HDL Cholesterol <130 mg/dL 808 (H)   (H): Data is abnormally high  (L): Data is abnormally low     Latest Reference Range & Units 03/06/24 12:57 03/14/24 11:19   Creatinine 0.55 - 1.02 mg/dL 9.47 (L) 9.38   (L): Data is abnormally low    Imaging:  I personally reviewed imaging available in Epic prior to the start of today's visit.    I reviewed and summarized (above) records in preparation for today's visit all pertinent notes in Epic/Media and CareEverywhere as well as any sent records.           [1]   Past Medical History:  Diagnosis Date    Anxiety     B-cell acute lymphoblastic leukemia (ALL)    (CMS-HCC) 10/29/2021    Cytomegalovirus (CMV) viremia    (CMS-HCC)     Diabetes mellitus (CMS-HCC)     Hypertension    [2]   Past Surgical History:  Procedure Laterality Date    BREAST CYST EXCISION Right     age 89    CHEMOTHERAPY      leukemia    IR INSERT PORT AGE GREATER THAN 5 YRS  12/08/2021    IR INSERT PORT AGE GREATER THAN 5 YRS 12/08/2021 Charmaine Arloa Provencal, PA IMG [facility]   [3] No family history on file.  [4]   Current Outpatient Medications:     albuterol  HFA 90 mcg/actuation inhaler, Inhale 2 puffs every six (6) hours as needed for wheezing. (Patient not taking: Reported on 07/01/2024), Disp: 8 g, Rfl: 1    blood sugar diagnostic (GLUCOSE BLOOD) Strp, Use to check blood sugar two times a day., Disp: 100 each, Rfl: 0    blood-glucose meter kit, Use to check blood sugar two times a day. Use as instructed., Disp: 1 each, Rfl: 0    blood-glucose sensor (DEXCOM G7 SENSOR)  Devi, 1 each by Miscellaneous route every ten (10) days., Disp: 9 each, Rfl: 3    cetirizine  (ZYRTEC ) 10 MG tablet, Take 1 tablet (10 mg total) by mouth daily., Disp: , Rfl:     [Paused] dapsone  100 MG tablet, Take 1 tablet (100 mg total) by mouth daily. TAKE 1 TABLET(100 MG) BY MOUTH DAILY (Patient not taking: Reported on 07/01/2024), Disp: 30 tablet, Rfl: 11    escitalopram  oxalate (LEXAPRO ) 10 MG tablet, Take 1 tablet (10 mg total) by mouth daily., Disp: 90 tablet, Rfl: 2    famotidine  (PEPCID ) 20 MG tablet, Take 1 tablet (20 mg total) by mouth two (2) times a day., Disp: 28 tablet, Rfl: 0    fluconazole  (DIFLUCAN ) 200 MG tablet, Take 2 tablets (400 mg total) by mouth daily., Disp: 60 tablet, Rfl: 1    heparin , porcine, PF, 100 unit/mL Syrg, Instill 5 mL in each of the 3 central line lumens three times per week., Disp: 180 mL, Rfl: 5    insulin  aspart (NOVOLOG  FLEXPEN) 100 unit/mL (3 mL) injection pen, Give 1 unit of insulin  for every 8 grams of carbs.  Use up to 30 units/day, divided TID AC meals per carb counting., Disp: , Rfl:     insulin  glargine (LANTUS  SOLOSTAR U-100 INSULIN ) 100 unit/mL (3 mL) injection pen, Inject 0.15 mL (15 Units total) under the skin daily., Disp: 15 mL, Rfl: 2    lancets Misc, Use to check blood sugar as directed 2 times a day & for symptoms of high or low blood sugar., Disp: 100 each, Rfl: 0    letermovir  (PREVYMIS ) 480 mg tablet, Take 1 tablet (480 mg total) by mouth at bedtime., Disp: 28 tablet, Rfl: 5    LORazepam  (ATIVAN ) 0.5 MG tablet, Take 1 tablet (0.5 mg total) by mouth every eight (8) hours as needed for anxiety., Disp: 12 tablet, Rfl: 0    [Paused] losartan  (COZAAR ) 50 MG tablet, Take 1 tablet (50 mg total) by mouth in the morning. (Patient not taking: Reported on 07/01/2024), Disp: 100 tablet, Rfl: 3    magnesium  oxide-Mg AA chelate (MAGNESIUM  PLUS PROTEIN) 133 mg, Take 2 tablets by mouth Three (3) times a day., Disp: 200 tablet, Rfl: 2    ondansetron  (ZOFRAN ) 8 MG tablet, Take 1 tablet (8 mg total) by mouth Three (3) times a day as needed., Disp: 30 tablet, Rfl: 2    tacrolimus  (PROGRAF ) 0.5 MG capsule, Take 3 capsules (1.5 mg total) by mouth daily AND 4 capsules (2 mg total) nightly., Disp: 210 capsule, Rfl: 5    traMADol  (ULTRAM ) 50 mg tablet, Take 1 tablet (50 mg total) by mouth every six (6) hours as needed for up to 7 days., Disp: 14 tablet, Rfl: 0    valACYclovir  (VALTREX ) 500 MG tablet, Take 1 tablet (500 mg total) by mouth two (2) times a day., Disp: 60 tablet, Rfl: 11  [5]   Allergies  Allergen Reactions    Other      Pt cant take ibuprofen due to condition.    Sulfa (Sulfonamide Antibiotics) Anaphylaxis     Other reaction(s): Not available    Azithromycin Hives     Per patient report    Erythromycin Hives     Other reaction(s): Not available    Clindamycin      Other Reaction(s): GI discomfort     Metoprolol Other (See Comments)     Other Reaction(s): Edema    Oxycodone  Headache     Migraines

## 2024-07-05 ENCOUNTER — Ambulatory Visit: Admit: 2024-07-05 | Discharge: 2024-07-06 | Payer: PRIVATE HEALTH INSURANCE

## 2024-07-05 ENCOUNTER — Other Ambulatory Visit: Admit: 2024-07-05 | Discharge: 2024-07-06 | Payer: PRIVATE HEALTH INSURANCE

## 2024-07-05 DIAGNOSIS — Z9481 Bone marrow transplant status: Principal | ICD-10-CM

## 2024-07-05 DIAGNOSIS — D849 Immunodeficiency, unspecified: Principal | ICD-10-CM

## 2024-07-05 DIAGNOSIS — C9100 Acute lymphoblastic leukemia not having achieved remission: Principal | ICD-10-CM

## 2024-07-05 DIAGNOSIS — Z794 Long term (current) use of insulin: Principal | ICD-10-CM

## 2024-07-05 DIAGNOSIS — E1165 Type 2 diabetes mellitus with hyperglycemia: Principal | ICD-10-CM

## 2024-07-05 DIAGNOSIS — D696 Thrombocytopenia, unspecified: Principal | ICD-10-CM

## 2024-07-05 LAB — CBC W/ AUTO DIFF
BASOPHILS ABSOLUTE COUNT: 0.1 10*9/L (ref 0.0–0.1)
BASOPHILS RELATIVE PERCENT: 1 %
EOSINOPHILS ABSOLUTE COUNT: 0 10*9/L (ref 0.0–0.5)
EOSINOPHILS RELATIVE PERCENT: 0.2 %
HEMATOCRIT: 33.4 % — ABNORMAL LOW (ref 34.0–44.0)
HEMOGLOBIN: 11.3 g/dL (ref 11.3–14.9)
LYMPHOCYTES ABSOLUTE COUNT: 1 10*9/L — ABNORMAL LOW (ref 1.1–3.6)
LYMPHOCYTES RELATIVE PERCENT: 18.7 %
MEAN CORPUSCULAR HEMOGLOBIN CONC: 33.7 g/dL (ref 32.0–36.0)
MEAN CORPUSCULAR HEMOGLOBIN: 32.7 pg — ABNORMAL HIGH (ref 25.9–32.4)
MEAN CORPUSCULAR VOLUME: 97 fL — ABNORMAL HIGH (ref 77.6–95.7)
MEAN PLATELET VOLUME: 9.9 fL (ref 6.8–10.7)
MONOCYTES ABSOLUTE COUNT: 0.6 10*9/L (ref 0.3–0.8)
MONOCYTES RELATIVE PERCENT: 12 %
NEUTROPHILS ABSOLUTE COUNT: 3.6 10*9/L (ref 1.8–7.8)
NEUTROPHILS RELATIVE PERCENT: 68.1 %
PLATELET COUNT: 140 10*9/L — ABNORMAL LOW (ref 150–450)
RED BLOOD CELL COUNT: 3.44 10*12/L — ABNORMAL LOW (ref 3.95–5.13)
RED CELL DISTRIBUTION WIDTH: 19.7 % — ABNORMAL HIGH (ref 12.2–15.2)
WBC ADJUSTED: 5.3 10*9/L (ref 3.6–11.2)

## 2024-07-05 LAB — COMPREHENSIVE METABOLIC PANEL
ALBUMIN: 3.8 g/dL (ref 3.4–5.0)
ALKALINE PHOSPHATASE: 84 U/L (ref 46–116)
ALT (SGPT): 40 U/L (ref 10–49)
ANION GAP: 12 mmol/L (ref 5–14)
AST (SGOT): 39 U/L — ABNORMAL HIGH (ref <=34)
BILIRUBIN TOTAL: 0.3 mg/dL (ref 0.3–1.2)
BLOOD UREA NITROGEN: 14 mg/dL (ref 9–23)
BUN / CREAT RATIO: 18
CALCIUM: 9.4 mg/dL (ref 8.7–10.4)
CHLORIDE: 104 mmol/L (ref 98–107)
CO2: 25 mmol/L (ref 20.0–31.0)
CREATININE: 0.78 mg/dL (ref 0.55–1.02)
EGFR CKD-EPI (2021) FEMALE: >90 mL/min/1.73m2 (ref >=60)
GLUCOSE RANDOM: 338 mg/dL — ABNORMAL HIGH (ref 70–179)
POTASSIUM: 4.6 mmol/L (ref 3.4–4.8)
PROTEIN TOTAL: 6.7 g/dL (ref 5.7–8.2)
SODIUM: 141 mmol/L (ref 135–145)

## 2024-07-05 LAB — MAGNESIUM: MAGNESIUM: 1.2 mg/dL — ABNORMAL LOW (ref 1.6–2.6)

## 2024-07-05 LAB — IGG: GAMMAGLOBULIN; IGG: 953 mg/dL (ref 650–1600)

## 2024-07-05 LAB — SLIDE REVIEW

## 2024-07-05 LAB — TACROLIMUS LEVEL: TACROLIMUS BLOOD: 15.6 ng/mL

## 2024-07-05 MED ORDER — TACROLIMUS 0.5 MG CAPSULE, IMMEDIATE-RELEASE
ORAL_CAPSULE | ORAL | 5 refills | 30.00000 days | Status: CP
Start: 2024-07-05 — End: 2025-07-05

## 2024-07-05 MED ORDER — DAPSONE 100 MG TABLET
ORAL_TABLET | Freq: Every day | ORAL | 11 refills | 30.00000 days | Status: CP
Start: 2024-07-05 — End: ?
  Filled 2024-07-05: qty 30, 30d supply, fill #0

## 2024-07-05 MED ORDER — METFORMIN ER 500 MG TABLET,EXTENDED RELEASE 24 HR
ORAL_TABLET | Freq: Every day | ORAL | 11 refills | 30.00000 days | Status: CP
Start: 2024-07-05 — End: 2025-07-05

## 2024-07-05 MED ORDER — MAGNESIUM OXIDE-MAGNESIUM AMINO ACID CHELATE 133 MG TABLET
ORAL_TABLET | ORAL | 2 refills | 29.00000 days | Status: CP
Start: 2024-07-05 — End: ?
  Filled 2024-07-05: qty 200, 29d supply, fill #0

## 2024-07-05 NOTE — Patient Instructions (Addendum)
 Please increase your magnesium  to 2 in the AM, 2 in the afternoon, and 3 tablets at night.    You can start Dapsone  1 tablet daily.     A pharmacist will call you if I need to make changes to your tacrolimus  dose.     -------------------------------------------------------------------------------  Return to infusion on Sunday for labs and IV magnesium   Return to clinic on Tuesday as scheduled.     Lab Results   Component Value Date    WBC 5.3 07/05/2024    HGB 11.3 07/05/2024    HCT 33.4 (L) 07/05/2024    PLT 140 (L) 07/05/2024     Lab Results   Component Value Date    NA 141 07/05/2024    K 4.6 07/05/2024    CL 104 07/05/2024    CO2 25.0 07/05/2024    BUN 14 07/05/2024    CREATININE 0.78 07/05/2024    GLU 338 (H) 07/05/2024    CALCIUM  9.4 07/05/2024    MG 1.2 (L) 07/05/2024    PHOS 4.5 06/26/2024     Lab Results   Component Value Date    BILITOT 0.3 07/05/2024    BILIDIR 0.10 06/26/2024    PROT 6.7 07/05/2024    ALBUMIN 3.8 07/05/2024    ALT 40 07/05/2024    AST 39 (H) 07/05/2024    ALKPHOS 84 07/05/2024    GGT 836 (H) 11/25/2023     Lab Results   Component Value Date    INR 1.15 06/26/2024    APTT 29.0 06/26/2024       For prescription refills:   For refills, please check your medication bottles to see if you have additional refills left. If so, please call your pharmacy and follow the directions to request a refill. If you do not have any refills left, please make a request during your clinic visit or by submitting a request through MyChart or by calling [phone]. Please allow 24 hours if your request is made during the week or 48 hours if requests are made on the weekends or holidays.     --------------------------------------------------------------------------------------------------------------------  For appointments & questions Monday through Friday 8 AM-4:30 PM     Please call [phone]     On Nights, Weekends and [place] [phone]. This is the nurse's station. They will contact the provider for you.     Please visit [email or web address], a resource created just for family members and caregivers.  This website lists support services, how and where to ask for help. It has tools to assist you as you help us  care for your loved one.    [facility]  [address]  [place], [place] [address]  [email or web address]

## 2024-07-05 NOTE — Telephone Encounter (Signed)
 Bone Marrow Transplant and Cellular Therapy Program  Immunosuppressive Therapy Note    Patricia [name] is a 48 y.o. 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 [name] is currently day 30.    Concurrent CYP3A4 inhibitors: Fluconazole , Letermovir      Current dose: 1.5 mg in AM and 2 mg in PM     Goal tacrolimus  Level: 5-10 ng/mL    Resulted level: 15.6 ng/mL , patient confirmed that she did not take her AM dose of tacrolimus  until after lab draw in clinic. She also confirmed that she has been taking her tacrolimus  as prescribed.     Lab Results   Component Value Date/Time    TACROLIMUS  15.6 07/05/2024 11:06 AM    TACROLIMUS  10.1 06/27/2024 09:37 AM    TACROLIMUS  7.7 06/26/2024 08:21 AM    TACROLIMUS  9.7 06/24/2024 08:43 AM       Lab Results   Component Value Date/Time    CREATININE 0.78 07/05/2024 11:06 AM    CREATININE 0.70 07/03/2024 12:32 PM    CREATININE 0.69 07/01/2024 01:23 PM      - tacrolimus  level is supra-therapeutic today and estimated true trough is slightly higher when accounting for late morning lab draw  - SCr WNL and overall stable   - T bili 0.3 and LFTs WNL  - given above, instructed patient to hold PM dose of tacrolimus  this evening and decrease tacrolimus  dose to 1 mg in AM and 1.5 mg in PM starting tomorrow morning (07/06/24). Plan to recheck tacrolimus  level on Sunday 12/7 in infusion and then again in our clinic on 12/9. Plan discussed with patient via phone and she verbalized understanding.     We will continue to monitor levels.  Patient will be followed for changes in renal and hepatic function, toxicity, and efficacy.     Elizebeth Has, PharmD CPP  [facility] Cellular Therapy [facility]

## 2024-07-05 NOTE — Patient Instructions (Addendum)
 Please start metformin  500 mg every day   Please refer to the following instructions for insulin :  Long-acting: 15 units once daily  Short-acting (Mealtime): 1 unit per 6 grams of carbohydrates three times daily before meals, remember to give 10-15 minutes before your meal  Correction factor: In addition to mealtime insulin  above, add 1 unit of short-acting insulin  for pre-meal blood sugars every 30 above 150, using the scale below:  70-150: no additional units  151-180: 1 unit  181-210: 2 units  211-240: 3 units  241-270: 4 units  271-300: 5 units  For low blood sugars <70 (confirmed by fingerstick), use the Rule of 15 to treat it: give yourself 15 grams of carbs (glucose tablets or 4 oz juice), then check fingerstick blood sugar 15 minutes later. Repeat this until your blood sugar is >70.  You can purchase glucose tablets over-the-counter from your local pharmacy.  Make sure you have a supply of glucagon  at home and with you outside of the home. This is used for emergencies for someone else to give to you when you are too weak or confused to treat a low blood sugar. Please designate someone to give you glucagon  and show them where it is.

## 2024-07-05 NOTE — Progress Notes (Signed)
 Dexcom meter downloaded in [employer]. POC glucose and A1C done today. PP 2:10 pm. 243 mg/dL.

## 2024-07-07 ENCOUNTER — Inpatient Hospital Stay: Admit: 2024-07-07 | Discharge: 2024-07-07 | Payer: PRIVATE HEALTH INSURANCE

## 2024-07-07 DIAGNOSIS — C9100 Acute lymphoblastic leukemia not having achieved remission: Principal | ICD-10-CM

## 2024-07-07 LAB — CBC W/ AUTO DIFF
BASOPHILS ABSOLUTE COUNT: 0.1 10*9/L (ref 0.0–0.1)
BASOPHILS RELATIVE PERCENT: 1.3 %
EOSINOPHILS ABSOLUTE COUNT: 0 10*9/L (ref 0.0–0.5)
EOSINOPHILS RELATIVE PERCENT: 0.3 %
HEMATOCRIT: 32.4 % — ABNORMAL LOW (ref 34.0–44.0)
HEMOGLOBIN: 11.2 g/dL — ABNORMAL LOW (ref 11.3–14.9)
LYMPHOCYTES ABSOLUTE COUNT: 0.8 10*9/L — ABNORMAL LOW (ref 1.1–3.6)
LYMPHOCYTES RELATIVE PERCENT: 14.4 %
MEAN CORPUSCULAR HEMOGLOBIN CONC: 34.5 g/dL (ref 32.0–36.0)
MEAN CORPUSCULAR HEMOGLOBIN: 33.3 pg — ABNORMAL HIGH (ref 25.9–32.4)
MEAN CORPUSCULAR VOLUME: 96.3 fL — ABNORMAL HIGH (ref 77.6–95.7)
MEAN PLATELET VOLUME: 9.1 fL (ref 6.8–10.7)
MONOCYTES ABSOLUTE COUNT: 0.7 10*9/L (ref 0.3–0.8)
MONOCYTES RELATIVE PERCENT: 14.1 %
NEUTROPHILS ABSOLUTE COUNT: 3.7 10*9/L (ref 1.8–7.8)
NEUTROPHILS RELATIVE PERCENT: 69.9 %
PLATELET COUNT: 116 10*9/L — ABNORMAL LOW (ref 150–450)
RED BLOOD CELL COUNT: 3.37 10*12/L — ABNORMAL LOW (ref 3.95–5.13)
RED CELL DISTRIBUTION WIDTH: 19.3 % — ABNORMAL HIGH (ref 12.2–15.2)
WBC ADJUSTED: 5.3 10*9/L (ref 3.6–11.2)

## 2024-07-07 LAB — COMPREHENSIVE METABOLIC PANEL
ALBUMIN: 3.7 g/dL (ref 3.4–5.0)
ALKALINE PHOSPHATASE: 80 U/L (ref 46–116)
ALT (SGPT): 95 U/L — ABNORMAL HIGH (ref 10–49)
ANION GAP: 13 mmol/L (ref 5–14)
AST (SGOT): 82 U/L — ABNORMAL HIGH (ref <=34)
BILIRUBIN TOTAL: 0.2 mg/dL — ABNORMAL LOW (ref 0.3–1.2)
BLOOD UREA NITROGEN: 19 mg/dL (ref 9–23)
BUN / CREAT RATIO: 25
CALCIUM: 9.3 mg/dL (ref 8.7–10.4)
CHLORIDE: 106 mmol/L (ref 98–107)
CO2: 22 mmol/L (ref 20.0–31.0)
CREATININE: 0.75 mg/dL (ref 0.55–1.02)
EGFR CKD-EPI (2021) FEMALE: >90 mL/min/1.73m2 (ref >=60)
GLUCOSE RANDOM: 326 mg/dL — ABNORMAL HIGH (ref 70–179)
POTASSIUM: 4 mmol/L (ref 3.4–4.8)
PROTEIN TOTAL: 6.5 g/dL (ref 5.7–8.2)
SODIUM: 141 mmol/L (ref 135–145)

## 2024-07-07 LAB — MAGNESIUM: MAGNESIUM: 1.2 mg/dL — ABNORMAL LOW (ref 1.6–2.6)

## 2024-07-07 MED ADMIN — magnesium sulfate 2gm/50mL IVPB: 2 g | INTRAVENOUS | @ 17:00:00 | Stop: 2024-07-07

## 2024-07-07 MED ADMIN — magnesium oxide-Mg AA chelate (Magnesium Plus Protein) 2 tablet: 2 | ORAL | @ 18:00:00 | Stop: 2024-07-07

## 2024-07-07 NOTE — Patient Instructions (Signed)
 Hospital Outpatient Visit on 07/07/2024   Component Date Value Ref Range Status    Sodium 07/07/2024 141  135 - 145 mmol/L Final    Potassium 07/07/2024 4.0  3.4 - 4.8 mmol/L Final    Chloride 07/07/2024 106  98 - 107 mmol/L Final    CO2 07/07/2024 22.0  20.0 - 31.0 mmol/L Final    Anion Gap 07/07/2024 13  5 - 14 mmol/L Final    BUN 07/07/2024 19  9 - 23 mg/dL Final    Creatinine 87/92/7974 0.75  0.55 - 1.02 mg/dL Final    BUN/Creatinine Ratio 07/07/2024 25   Final    eGFR CKD-EPI (2021) Female 07/07/2024 >90  >=60 mL/min/1.57m2 Final    eGFR calculated with CKD-EPI 2021 equation in accordance with [employer] and [employer] of Neph[employer] Task Force recommendations.    Glucose 07/07/2024 326 (H)  70 - 179 mg/dL Final    Calcium  07/07/2024 9.3  8.7 - 10.4 mg/dL Final    Albumin 87/92/7974 3.7  3.4 - 5.0 g/dL Final    Total Protein 07/07/2024 6.5  5.7 - 8.2 g/dL Final    Total Bilirubin 07/07/2024 0.2 (L)  0.3 - 1.2 mg/dL Final    AST 87/92/7974 82 (H)  <=34 U/L Final    ALT 07/07/2024 95 (H)  10 - 49 U/L Final    Alkaline Phosphatase 07/07/2024 80  46 - 116 U/L Final    Magnesium  07/07/2024 1.2 (L)  1.6 - 2.6 mg/dL Final    WBC 87/92/7974 5.3  3.6 - 11.2 10*9/L Final    RBC 07/07/2024 3.37 (L)  3.95 - 5.13 10*12/L Final    HGB 07/07/2024 11.2 (L)  11.3 - 14.9 g/dL Final    HCT 87/92/7974 32.4 (L)  34.0 - 44.0 % Final    MCV 07/07/2024 96.3 (H)  77.6 - 95.7 fL Final    MCH 07/07/2024 33.3 (H)  25.9 - 32.4 pg Final    MCHC 07/07/2024 34.5  32.0 - 36.0 g/dL Final    RDW 87/92/7974 19.3 (H)  12.2 - 15.2 % Final    MPV 07/07/2024 9.1  6.8 - 10.7 fL Final    Platelet 07/07/2024 116 (L)  150 - 450 10*9/L Final    Neutrophils % 07/07/2024 69.9  % Final    Lymphocytes % 07/07/2024 14.4  % Final    Monocytes % 07/07/2024 14.1  % Final    Eosinophils % 07/07/2024 0.3  % Final    Basophils % 07/07/2024 1.3  % Final    Absolute Neutrophils 07/07/2024 3.7  1.8 - 7.8 10*9/L Final    Absolute Lymphocytes 07/07/2024 0.8 (L)  1.1 - 3.6 10*9/L Final    Absolute Monocytes 07/07/2024 0.7  0.3 - 0.8 10*9/L Final    Absolute Eosinophils 07/07/2024 0.0  0.0 - 0.5 10*9/L Final    Absolute Basophils 07/07/2024 0.1  0.0 - 0.1 10*9/L Final    Anisocytosis 07/07/2024 Moderate (A)  Not Present Final

## 2024-07-07 NOTE — Progress Notes (Signed)
 Patient arrived to chair 5.  Labs collected via CVC catheter/line with brisk blood return.  Patient received 2g of mag over 2 hours and oral replacement for mag level of 1.2.  Patient instructed to also take her home dose of magnesium .  Patient ambulated off unit in stable condition.

## 2024-07-08 NOTE — Progress Notes (Unsigned)
 BMT Clinic Follow-up    [name]  MRN: [id number]  Encounter date: 07/09/2024    Referring Physician: Estelle Toribio SAUNDERS, MD  BMT Attending MD: Dr. Jeaneen    Transplant Day: 68     Reason for Visit: Post allogeneic transplant follow up. This visit included intensive monitoring of high-risk medications/disease states given risk of severe myelosuppression, immunosuppression, infection, and other toxicities. Review included CBC, electrolytes and other blood tests, as well as direct assessment of the patient for symptoms suggesting toxicity.    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 Her transplant course was complicated by T2DM with steroid induced hyperglycemia (stable on current regimen at discharge), prolonged Qtc on admission (resolved with last Qtc 460 from 11/20), non-infectious diarrhea (resolved without recent need for imodium ). 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 Received most recent dose of IVIG on 11/26.     Interval History  Patricia [name] is a 48 y.o. 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 [name] is now s/p a matched unrelated donor stem cell transplant transplant.    - Mild nausea, takes PRN antiemetics. No vomiting.   - Loose BM. No diarrhea.   - Oral intake good. Pushing fluids and kidney function is stable.   - No fevers, chills, or infectious symptoms. Has chronic sinus pain, worsens with dry air and mask wearing but overall stable. Denies congestion or cough.     Physical Exam:  BP 118/90  - Pulse 89  - Temp 36.6 ??C (97.9 ??F) (Temporal)  - Ht 168 cm (5' 6.14)  - Wt 76.8 kg (169 lb 5 oz)  - SpO2 98%  - BMI 27.21 kg/m??     70, Cares for self; unable to carry on normal activity or to do active work (ECOG equivalent 1)    General: No acute distress noted. Looks good.   Central venous access: Line clean, dry, intact. No erythema or drainage noted.   ENT: Moist mucous membranes. Oropharhynx without lesions, erythema or exudate.   Cardiovascular: Pulse normal rate, regularity and rhythm. S1 and S2 normal, without any murmur, rub, or gallop.  Lungs: Clear to auscultation bilaterally, without wheezes/crackles/rhonchi. Good air movement.   Skin: Warm, dry, intact. No rash noted.    Psychiatry: Alert and oriented to person, place, and time.   Gastrointestinal/Abdomen: Normoactive bowel sounds, abdomen soft, non-tender   Musculoskeletal/Extremities: FROM throughout. No edema  Neurologic: CNII-XII intact. Normal strength and sensation throughout    Acute GVHD Assessment          Most Recent Value    07/02/2024 - 07/08/2024 Allogeneic BMT Unknown Phase - Planned 06/12/2024-Post 1 Year    07/02/2024 08:26 Allogeneic BMT Unknown Phase - Planned 06/12/2024-Post 1 Year    07/03/2024 18:56 Allogeneic BMT Unknown Phase - Planned 06/12/2024-Post 1 Year    07/05/2024 18:18   Acute GVHD Grade and Severity   Overall Grade (Przepiorka) 0  07/05/2024 0 0 0     Max Grade of acute GVHD post-HCT: Grade 0: No stage 1-4 of any organ  Max Grade of chronic GVHD post-HCT: Grade 0: No stage 1-4 of any organ    Assessment/Plan:  1) SCT Summary:  - Type of Transplant: RIC MUD Allo (Fludarabine  40 mg/m2/Melphalan  100  mg/m2)  - Graft Source: Fresh PBSCs10/10, Female, ABO O-, CMV negative     2) Disease: ALL  - Pre BMT disease status: CR MRD Negative  -  Signs/symptoms of disease relapse: None   - Restaging plan:    *Next BMBX: Day +90   *Next Chimerism: Day +30    3) Immunosuppressed: High risk of infections, if patient develops a fever will admit to inpatient unit for IV antibiotics and fever workup   - Prophylaxis:          * Antiviral: Valtrex  500 mg po BID through 2 years post transplant.   Letermovir  480 mg po daily through day +200 to prevent CMV infections   * Antifungal: Fluconazole  400 mg po daily through day +75  * PJP: Sulfa allergic. Dapsone  daily upon platelet engraftment, has tolerated this well in the past. Toxo PCR negative. Start upon platelet engraftment.    -12/5: Hgb improved, will start Dapsone  100mg  daily.     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   Previously required Valcyte  treatment. 11/16 CMV viral load undetectable.  - Continue Letermovir  and Valtrex  BID through day +200 post SCT.     Recurrent Acute Sinusitis  Recurrent acute sinusitis with nasal congestion, frontal headache, and low-grade fever. Responded well to Augmentin .  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   - Prophylaxis:  - Methotrexate  5 mg/m2 IVP on days +1, +3, +6 and +11  - 1.Tacrolimus      * Goal trough: 5-10     * High risk drug monitoring and dose adjustments per BMT Pharmacist    * Signs/symptoms of toxicity: None      5) Anemia/Thrombocytopenia:  - Transfusion criteria: Transfuse 1 unit of PRBCs for hemoglobin <7 and 1 unit of platelets for Plt <10K or bleeding. [name] Babino does not have a history of transfusion reactions. Consent was obtained and documented on 06/05/24.   - Anemia: Secondary to chemotherapy. Hgb slightly lower from labs on discharge but stable from last visit, continue to monitor.   - Thrombocytopenia: Secondary to chemotherapy. Plts stable in 120 range. Continue to monitor.     6) GI:   GERD prophylaxis: Pepcid  BID  Nausea:PRN zofran  and compazine , not currently needing.   Diarrhea: PRN Imodium , has not needed     7) Hypomagnesemia:   - Mg is 1.2 (stable from last visit). She just increased her magnesium  to 2 tabs TID yesterday Discussed IV magnesium  today but she is unable to stay for magnesium  due to her appointment at [facility] with endocrinology. Trying to be cautious with oral magnesium  increase given it upsets her stomach. Will increase magnesium  to 2 tabs in the AM, 2 tabs in the aternoon, and 3 tabs in the PM. She will return on Sunday for a lab check and magnesium  replacement if needed. She gets nauseous with IV magnesium  so will need to run 2g over 2 hours whenever she needs IV magnesium .     8)  Heart failure with mildly reduced ejection fraction (HFmrEF)   Followed by [facility]:  Due to anythracycline use  Hx of EF 45-50%, most recent Echo 05/23/24 improved to 55-60%  Hx of prolonged QTc   - Home med Losartan  50 mg daily (held 11/18)  - BP stable, continue to omnitor.  9) Pulm/Allergies   - cetirizine  10 mg daily   - albuterol  PRN     10) Menses suppression: Lupron  last 07/26/22. No menses      11) Type 2 DM exacerbated by steroids: Followed by [facility]. At home uses Dexcom for monitoring. Has follow up with Endocrinology on 07/05/24.   - Increase insulin  glargine from 12u to 15u once daily  - Continue insulin  aspart with meals per carb counting (1 unit for every 8 grams of carbs)    12) Pain:  - PRN tramadol  50 mg. No oxy as this give patient migraines     13) Anxiety:  Lexapro  10 mg daily (has PRN ativan  available but has not needed)  Reconnected with her local therapist      14) DispoCaregivers: Lives in [place], [place]  Primary caregiver: Jeanie Tate (mom)- first 30 days at [facility] then home if doing well will stay longer if medically needed  Secondary caregiver: Gerlean Cid (husband)    15) Bone Health: DEXA pending: scheduled 07/09/24    Plan:  - Good engraftment. Chimerism ordered.   - On tac. No clinical e/o GVHD. Although transaminases are trending up. Will hold Pepcid  and Dapsone . Will Rx pentamidine - next visit.   If no improvement will consider holding fluconazole .   Patient says she is not taking any OTC medications, herbs or supplements.   - RTC Friday.      Jamil Armwood J Jeral Zick, MD  [facility]      Future Appointments   Date Time Provider Department Center   07/09/2024 11:30 AM [facility] DEXA RM 1 IDEXAU[facility]   07/09/2024 12:30 PM ONCBMT LABS HONCBMT [facility]   07/09/2024  1:30 PM [name], MD [facility]ON[facility]MT [facility]   07/12/2024  2:30 PM ONCBMT LABS [facility]   07/12/2024  3:00 PM ONCBMT APP A [facility]   07/16/2024  1:00 PM ONCBMT LABS [facility]   07/16/2024  2:00 PM ONCBMT APP A [facility]   07/19/2024  1:00 PM ONCBMT LABS [facility]ON[facility]   07/19/2024  2:00 PM ONCBMT APP B [facility]   07/22/2024  2:00 PM ONCBMT LABS [facility]ON[facility]MT [facility]   07/22/2024  3:00 PM ONCBMT APP A [facility]   07/24/2024 11:00 AM ONCBMT LABS [facility]ON[facility]MT [facility]   07/24/2024 12:00 PM ONCBMT APP A [facility]   08/08/2024  7:30 AM ONCINF CHAIR 18 HONC3UCA [facility]   08/13/2024  9:30 AM ONCBMT LABS [facility]ON[facility]MT [facility]   08/13/2024 10:30 AM [name], MD HONCBMT [facility]   08/13/2024  1:25 PM Delores Fleeting, MD UNCHRTVASET [facility]   08/27/2024  9:50 AM Kome, Arlean HERO, CPP UNCDIABENDET [facility]   09/05/2024 10:00 AM Jarold Motto T UNCDIABENDET [facility]   09/10/2024  9:00 AM ONCBMT LABS HONCBMT [facility]   09/10/2024 10:00 AM ONCBMT APP A [facility]ON[facility]MT [facility]   09/10/2024 11:00 AM Polly Alyce Dover, MD ONCMULTI [facility]   09/10/2024 12:00 PM ONCBMT PROCEDURES HONC3UCA [facility]   09/17/2024  9:15 AM ONCBMT LABS HONCBMT [facility]   09/17/2024 10:00 AM [name], MD HONCBMT [facility]   09/17/2024 11:00 AM CARSER EKG HVCARD2UMH [facility]   09/24/2024  8:15 AM ONCBMT LABS HONCBMT [facility]   09/24/2024  9:00 AM ONCBMT APP A [facility]ON[facility]MT [facility]   09/24/2024 10:00 AM ONCBMT PHARMACY HONCBMT [facility]  09/24/2024 10:30 AM ONCBMT COORDINATOR HONCBMT [facility]   12/10/2024  9:30 AM ONCBMT LABS HONCBMT [facility]   12/10/2024 10:30 AM ONCBMT PHARMACY HONCBMT [facility]   12/27/2024  9:40 AM Storm Honer, MD UNCDIABENDET [facility] 1:25 PM Delores Fleeting, MD UNCHRTVASET [facility]   08/27/2024  9:50 AM Kome, Arlean HERO, CPP UNCDIABENDET [facility]   09/05/2024 10:00 AM Jarold Motto T UNCDIABENDET [facility]   09/10/2024  9:00 AM ONCBMT LABS HONCBMT [facility]   09/10/2024 10:00 AM ONCBMT APP A [facility]ONCBMT [facility]   09/10/2024 11:00 AM Polly Alyce Dover, MD ONCMULTI [facility]   09/10/2024 12:00 PM ONCBMT PROCEDURES HONC3UCA [facility]   09/17/2024  9:15 AM ONCBMT LABS HONCBMT [facility]   09/17/2024 10:00 AM [name], MD HONCBMT [facility]   09/17/2024 11:00 AM CARSER EKG HVCARD2UMH [facility]   09/24/2024  8:15 AM ONCBMT LABS HONCBMT [facility]   09/24/2024  9:00 AM ONCBMT APP A HONCBMT [facility]   09/24/2024 10:00 AM ONCBMT PHARMACY HONCBMT [facility]   09/24/2024 10:30 AM ONCBMT COORDINATOR HONCBMT [facility]   12/10/2024  9:30 AM ONCBMT LABS HONCBMT [facility]   12/10/2024 10:30 AM ONCBMT PHARMACY HONCBMT [facility]   12/27/2024  9:40 AM Storm Honer, MD UNCDIABENDET [facility]

## 2024-07-08 NOTE — Progress Notes (Signed)
 Teaching Physician Attestation:    I saw and evaluated the patient, participating in the key portions of the service.  I reviewed the resident???s note.  I agree with the resident???s findings and plan.     Carlean Jews, MD

## 2024-07-09 ENCOUNTER — Other Ambulatory Visit: Admit: 2024-07-09 | Discharge: 2024-07-09 | Payer: PRIVATE HEALTH INSURANCE

## 2024-07-09 ENCOUNTER — Inpatient Hospital Stay: Admit: 2024-07-09 | Discharge: 2024-07-09 | Payer: PRIVATE HEALTH INSURANCE

## 2024-07-09 ENCOUNTER — Ambulatory Visit: Admit: 2024-07-09 | Discharge: 2024-07-09 | Payer: PRIVATE HEALTH INSURANCE

## 2024-07-09 DIAGNOSIS — C9100 Acute lymphoblastic leukemia not having achieved remission: Principal | ICD-10-CM

## 2024-07-09 DIAGNOSIS — Z9481 Bone marrow transplant status: Principal | ICD-10-CM

## 2024-07-09 LAB — CBC W/ AUTO DIFF
BASOPHILS ABSOLUTE COUNT: 0.1 10*9/L (ref 0.0–0.1)
BASOPHILS RELATIVE PERCENT: 1.3 %
EOSINOPHILS ABSOLUTE COUNT: 0.1 10*9/L (ref 0.0–0.5)
EOSINOPHILS RELATIVE PERCENT: 1.8 %
HEMATOCRIT: 33.4 % — ABNORMAL LOW (ref 34.0–44.0)
HEMOGLOBIN: 11.3 g/dL (ref 11.3–14.9)
LYMPHOCYTES ABSOLUTE COUNT: 0.8 10*9/L — ABNORMAL LOW (ref 1.1–3.6)
LYMPHOCYTES RELATIVE PERCENT: 15.7 %
MEAN CORPUSCULAR HEMOGLOBIN CONC: 33.9 g/dL (ref 32.0–36.0)
MEAN CORPUSCULAR HEMOGLOBIN: 32.9 pg — ABNORMAL HIGH (ref 25.9–32.4)
MEAN CORPUSCULAR VOLUME: 97.2 fL — ABNORMAL HIGH (ref 77.6–95.7)
MEAN PLATELET VOLUME: 9 fL (ref 6.8–10.7)
MONOCYTES ABSOLUTE COUNT: 0.8 10*9/L (ref 0.3–0.8)
MONOCYTES RELATIVE PERCENT: 15.4 %
NEUTROPHILS ABSOLUTE COUNT: 3.5 10*9/L (ref 1.8–7.8)
NEUTROPHILS RELATIVE PERCENT: 65.8 %
PLATELET COUNT: 115 10*9/L — ABNORMAL LOW (ref 150–450)
RED BLOOD CELL COUNT: 3.43 10*12/L — ABNORMAL LOW (ref 3.95–5.13)
RED CELL DISTRIBUTION WIDTH: 18.6 % — ABNORMAL HIGH (ref 12.2–15.2)
WBC ADJUSTED: 5.3 10*9/L (ref 3.6–11.2)

## 2024-07-09 LAB — COMPREHENSIVE METABOLIC PANEL
ALBUMIN: 3.6 g/dL (ref 3.4–5.0)
ALKALINE PHOSPHATASE: 82 U/L (ref 46–116)
ALT (SGPT): 123 U/L — ABNORMAL HIGH (ref 10–49)
ANION GAP: 13 mmol/L (ref 5–14)
AST (SGOT): 88 U/L — ABNORMAL HIGH (ref <=34)
BILIRUBIN TOTAL: 0.2 mg/dL — ABNORMAL LOW (ref 0.3–1.2)
BLOOD UREA NITROGEN: 20 mg/dL (ref 9–23)
BUN / CREAT RATIO: 27
CALCIUM: 9.3 mg/dL (ref 8.7–10.4)
CHLORIDE: 109 mmol/L — ABNORMAL HIGH (ref 98–107)
CO2: 21 mmol/L (ref 20.0–31.0)
CREATININE: 0.75 mg/dL (ref 0.55–1.02)
EGFR CKD-EPI (2021) FEMALE: >90 mL/min/1.73m2 (ref >=60)
GLUCOSE RANDOM: 321 mg/dL — ABNORMAL HIGH (ref 70–179)
POTASSIUM: 4 mmol/L (ref 3.5–5.1)
PROTEIN TOTAL: 6.3 g/dL (ref 5.7–8.2)
SODIUM: 143 mmol/L (ref 135–145)

## 2024-07-09 LAB — EBV QUANTITATIVE PCR, BLOOD: EBV VIRAL LOAD RESULT: NOT DETECTED

## 2024-07-09 LAB — CMV DNA, QUANTITATIVE, PCR: CMV VIRAL LD: NOT DETECTED

## 2024-07-09 LAB — MAGNESIUM: MAGNESIUM: 1.3 mg/dL — ABNORMAL LOW (ref 1.6–2.6)

## 2024-07-09 LAB — IGG: GAMMAGLOBULIN; IGG: 864 mg/dL (ref 650–1600)

## 2024-07-09 LAB — TACROLIMUS LEVEL: TACROLIMUS BLOOD: 12.2 ng/mL

## 2024-07-09 MED ORDER — TACROLIMUS 0.5 MG CAPSULE, IMMEDIATE-RELEASE
ORAL_CAPSULE | Freq: Two times a day (BID) | ORAL | 5 refills | 30.00000 days | Status: CP
Start: 2024-07-09 — End: 2025-07-09

## 2024-07-09 NOTE — Telephone Encounter (Signed)
 Bone Marrow Transplant and Cellular Therapy Program  Immunosuppressive Therapy Note    Patricia [name] is a 48 y.o. 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 Patricia [name] is currently day 5.    Concurrent CYP3A4 inhibitors: Fluconazole , Letermovir      Current dose: 1 mg in AM and 1.5 mg in PM (decreased on 12/6 after holding PM dose on 12/5)     Goal tacrolimus  Level: 5-10 ng/mL    Resulted level: 12.2 ng/mL  drawn at 12:17 PM, patient confirmed that she did not take her AM dose of tacrolimus  until after lab draw in clinic. She also confirmed that she has been taking her tacrolimus  as prescribed.     Lab Results   Component Value Date/Time    TACROLIMUS  12.2 07/09/2024 12:17 PM    TACROLIMUS  15.6 07/05/2024 11:06 AM    TACROLIMUS  10.1 06/27/2024 09:37 AM    TACROLIMUS  7.7 06/26/2024 08:21 AM    TACROLIMUS  9.7 06/24/2024 08:43 AM       Lab Results   Component Value Date/Time    CREATININE 0.75 07/09/2024 12:17 PM    CREATININE 0.75 07/07/2024 11:27 AM    CREATININE 0.78 07/05/2024 11:06 AM      - tacrolimus  level has trended down following dose decrease however it is still supra-therapeutic today and estimated true trough is slightly higher when accounting for late lab draw  - SCr WNL and overall stable   - T bili 0.3 and AST/ALT slightly elevated today  - given above, instructed patient to hold PM dose of tacrolimus  this evening (12/9)and decrease tacrolimus  dose to 1 mg PO twice daily starting tomorrow morning (07/10/24). Plan to recheck tacrolimus  level on Friday 12/12 in our clinic. Plan discussed with patient via phone and she verbalized understanding.     We will continue to monitor levels.  Patient will be followed for changes in renal and hepatic function, toxicity, and efficacy.     Elizebeth Has, PharmD CPP  [facility] Bone Marrow Transplant and Cellular Therapy Program

## 2024-07-11 DIAGNOSIS — C9100 Acute lymphoblastic leukemia not having achieved remission: Principal | ICD-10-CM

## 2024-07-11 NOTE — Progress Notes (Signed)
 BMT Clinic Follow-up    [name]  MRN: [id number]  Encounter date: 07/12/2024    Referring Physician: Estelle Toribio SAUNDERS, MD  BMT Attending MD: Dr. Jeaneen    Transplant Day: 30     Reason for Visit: Post allogeneic transplant follow up. This visit included intensive monitoring of high-risk medications/disease states given risk of severe myelosuppression, immunosuppression, infection, and other toxicities. Review included CBC, electrolytes and other blood tests, as well as direct assessment of the patient for symptoms suggesting toxicity.    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 Her transplant course was complicated by T2DM with steroid induced hyperglycemia (stable on current regimen at discharge), prolonged Qtc on admission (resolved with last Qtc 460 from 11/20), non-infectious diarrhea (resolved without recent need for imodium ). 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 Received most recent dose of IVIG on 11/26.     Interval History  Patricia [name] is a 48 y.o. 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  Arrives with her mom today for follow up visit.  Overall doing well.   She is eating and drinking ok.  Sometimes has some mild nausea, no vomiting, using prn Zofran   Has had loose stools progressing to liquid stool in the morning, usually has 3 bowel movements in the AM and then none further during the day.   Recently started Metformin  so combo of this + Magnesium  could be causing some of the looser AM movements.   Has not used Imodium  but discussed ok to take if remain liquid and not firming back up  No fevers, chills, or infectious symptoms. Has chronic sinus pain, worsens with dry air and mask wearing but overall stable. Denies congestion or cough.  No skin rash, skin is dry from air and heat at [facility].  Denies any new oral or ocular symptoms.  At last visit earlier this week with Dr. Jeaneen, LFTs were elevated. She stopped her Dapsone  and Pepcid  in case these could be contributing.   She does not have any other s/s of GVHD.   Blood sugars have been in the 200-300 range. Saw endocrine last week, they added Metformin . She is also using   Afebrile with no fevers or other infectious symptoms.   She would like to go home for Christmas day, Dr. Jeaneen told her ok. She would also like to move back home but Dr. Jeaneen felt it was too early for this right now, so can re-discuss again next month. Staying at [facility] is getting expensive for her.   She also would like her Hickman removed, she has a double lumen port that is functioning so this is ok. I put in referral today.     ID: Valtrex , Fluconazole  (Hold beginning 12/12 with elevated LFTs), Dapsone  (Held 12/9 due to elevated LFTs) will schedule next week for Pentamidine requested 12/19  Tacrolimus : Current dose 1 mg po bid    Physical Exam:  BP 113/77  - Pulse 96  - Temp 36.5 ??C (97.7 ??F) (Oral)  - Resp 18  - SpO2 97%     70, Cares for self; unable to carry on normal activity or to do active work (ECOG equivalent 1)    General: No acute distress noted. Looks good.   Central venous access: Line clean, dry, intact. No erythema or drainage noted.   ENT: Moist mucous membranes. Oropharhynx  without lesions, erythema or exudate.   Cardiovascular: Pulse normal rate, regularity and rhythm. S1 and S2 normal, without any murmur, rub, or gallop.  Lungs: Clear to auscultation bilaterally, without wheezes/crackles/rhonchi. Good air movement.   Skin: Warm, dry, intact. No rash noted.    Psychiatry: Alert and oriented to person, place, and time.   Gastrointestinal/Abdomen: Normoactive bowel sounds, abdomen soft, non-tender   Musculoskeletal/Extremities: FROM throughout. No edema  Neurologic: CNII-XII intact. Normal strength and sensation throughout    Acute GVHD Assessment          Most Recent Value    07/02/2024 - 07/12/2024 Allogeneic BMT Unknown Phase - Planned 06/12/2024-Post 1 Year    07/02/2024 08:26 Allogeneic BMT Unknown Phase - Planned 06/12/2024-Post 1 Year    07/03/2024 18:56 Allogeneic BMT Unknown Phase - Planned 06/12/2024-Post 1 Year    07/05/2024 18:18   Acute GVHD Grade and Severity   Overall Grade (Przepiorka) 0  07/05/2024 0 0 0     Max Grade of acute GVHD post-HCT: Grade 0: No stage 1-4 of any organ  Max Grade of chronic GVHD post-HCT: Grade 0: No stage 1-4 of any organ    Assessment/Plan:  1) SCT Summary:  - Type of Transplant: RIC MUD Allo (Fludarabine  40 mg/m2/Melphalan  100  mg/m2)  - Graft Source: Fresh PBSCs10/10, Female, ABO O-, CMV negative     2) Disease: ALL  - Pre BMT disease status: CR MRD Negative  - Signs/symptoms of disease relapse: None   - Restaging plan:    *Next BMBX: Day +90   *Next Chimerism: Day +30  (ordered for visit on 12/12)    Date Source Unfractionated CD3   07/12/24  PB                 3) Immunosuppressed: High risk of infections, if patient develops a fever will admit to inpatient unit for IV antibiotics and fever workup   - Prophylaxis:          * Antiviral: Valtrex  500 mg po BID through 2 years post transplant.   Letermovir  480 mg po daily through day +200 to prevent CMV infections   * Antifungal: Fluconazole  400 mg po daily through day +75 (Hold beginning 12/12 with LFT elevations)  * PJP: Sulfa allergic. Dapsone  daily upon platelet engraftment, has tolerated this well in the past. Toxo PCR negative. Start upon platelet engraftment.    -12/5: Hgb improved, will start Dapsone  100mg  daily. Dapsone  held on 12/9 due to increasing LFTs will schedule for Pentamidine next week, requested 12/19.     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   Previously required Valcyte  treatment. 11/16 CMV viral load undetectable.  - Continue Letermovir  and Valtrex  BID through day +200 post SCT.     Recurrent Acute Sinusitis  Recurrent acute sinusitis with nasal congestion, frontal headache, and low-grade fever. Responded well to Augmentin .  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   - Prophylaxis:  - Methotrexate  5 mg/m2 IVP on days +1, +3, +6 and +11  - 1.Tacrolimus      * Goal trough: 5-10     * High risk drug monitoring and dose  adjustments per BMT Pharmacist    * Signs/symptoms of toxicity: None      5) Anemia/Thrombocytopenia:  - Transfusion criteria: Transfuse 1 unit of PRBCs for hemoglobin <7 and 1 unit of platelets for Plt <10K or bleeding. [name] Leckey does not have a history of transfusion reactions. Consent was obtained and documented on 06/05/24.   - Anemia: Secondary to chemotherapy. Hgb stable up to 11  - Thrombocytopenia: Secondary to chemotherapy. Plts stable in 100 range. Continue to monitor.     6) GI/Liver:   GERD prophylaxis: Pepcid  BID  Nausea:PRN zofran  and compazine , not currently needing.   Diarrhea: PRN Imodium , has not needed    Elevated liver enzymes:  - Started trending up on 12/5 with mildly elevated AST  - On 12/9 both AST and ALT elevated- Dapsone  and Pepcid  held  - 12/12: AST 112 and ALT 172 normal bili. Will hold Fluconazole  until next visit to re-evaluate. Sending adeno and HHV-6. CMV and EBV have been negative.      7) Hypomagnesemia:   - Mg is 1.3, this has been stable on oral.  Current dose is 2 tabs AM , 2 tabs afternoon, and 3 tabs at bedtime  Trying to be cautious with oral magnesium  increase given it upsets her stomach. She gets nauseous with IV magnesium  so will need to run 2g over 2 hours whenever she needs IV magnesium .     8)  Heart failure with mildly reduced ejection fraction (HFmrEF)   Followed by [facility] Cardiology:  Due to anythracycline use  Hx of EF 45-50%, most recent Echo 05/23/24 improved to 55-60%  Hx of prolonged QTc   - Home med Losartan  50 mg daily (held 11/18)  - BP stable, continue to omnitor.     BP Readings from Last 3 Encounters:   07/12/24 113/77   07/09/24 118/90   07/07/24 122/81        9) Pulm/Allergies   - cetirizine  10 mg daily   - albuterol  PRN     10) Menses suppression: Lupron  last 07/26/22. No menses      11) Type 2 DM exacerbated by steroids: Followed by [facility] Endocrinology. At home uses Dexcom for monitoring. Has follow up with Endocrinology on 07/05/24.   - Increase insulin  glargine from 12u to 15u once daily  - Continue insulin  aspart with meals per carb counting (1 unit for every 8 grams of carbs)  - Endocrine added Metformin  500 mg XR daily on 07/05/24. Next follow up 08/28/23    12) Pain:  - PRN tramadol  50 mg. No oxy as this give patient migraines     13) Anxiety:  Lexapro  10 mg daily (has PRN ativan  available but has not needed)  Reconnected with her local therapist      14) DispoCaregivers: Lives in [place], [place]  Primary caregiver: Jeanie Tate (mom)- first 30 days at [facility] then home if doing well will stay longer if medically needed  Secondary caregiver: Sheyla Zaffino (husband)    15) Bone Health: DEXA 07/09/24: Results with normal bone density    Plan:  - Good engraftment. Chimerism pending from today.   - On tac. No clinical e/o GVHD. Although transaminases are trending up.Stopped Pepcid  and Dapsone . Will Rx pentamidine -requested for 12/19.  - Since today LFTs continue to trend up will hold fluconazole  until next visit on Tuesday 12/16 to re-evaluate labs  Patient says she is not taking any OTC medications, herbs or supplements.   -Requested line removal since she  has dual lumen port  - RTC Tuesday and Friday next week      Rosina MARLA Morse, ANP  [facility] Bone Marrow Transplant and Cellular Therapy Program      Future Appointments   Date Time Provider Department Center   07/12/2024  3:00 PM Morse Rosina Neptune, ANP HONCBMT [facility]   07/16/2024  1:00 PM ONCBMT LABS [facility]   07/16/2024  2:00 PM ONCBMT APP A [facility]   07/19/2024  1:00 PM ONCBMT LABS [facility]   07/19/2024  2:00 PM ONCBMT APP B [facility]   07/22/2024  2:00 PM ONCBMT LABS [facility]   07/22/2024  3:00 PM ONCBMT APP A [facility]   07/24/2024 11:00 AM ONCBMT LABS [facility]   07/24/2024 12:00 PM ONCBMT APP A [facility]   08/08/2024  7:30 AM ONCINF CHAIR 18 HONC3UCA [facility]   08/13/2024  9:30 AM ONCBMT LABS [facility]   08/13/2024 10:30 AM [name], MD [facility]ON[facility]   08/13/2024  1:25 PM Delores Fleeting, MD UNCHRTVASET [facility]   08/27/2024  9:50 AM Kome, Arlean HERO, CPP UNCDIABENDET [facility]   09/05/2024 10:00 AM Jarold Motto T UNCDIABENDET [facility]   09/10/2024  9:00 AM ONCBMT LABS [facility]   09/10/2024 10:00 AM ONCBMT APP A [facility]   09/10/2024 11:00 AM Polly Alyce Dover, MD ONCMULTI [facility]   09/10/2024 12:00 PM ONCBMT PROCEDURES HONC3UCA [facility]   09/17/2024  9:15 AM ONCBMT LABS [facility]ON[facility]MT [facility]   09/17/2024 10:00 AM [name], MD HONCBMT [facility]   09/17/2024 11:00 AM CARSER EKG [id number] [facility]   09/24/2024  8:15 AM ONCBMT LABS HON[facility]MT [facility]   09/24/2024  9:00 AM ONCBMT APP A [facility]   09/24/2024 10:00 AM ONCBMT PHARMACY [facility]   09/24/2024 10:30 AM ONCBMT COORDINATOR [facility]ON[facility]MT [facility]   12/10/2024  9:30 AM ONCBMT LABS [facility]   12/10/2024 10:30 AM ONCBMT PHARMACY [facility]ON[facility]MT [facility]   12/27/2024  9:40 AM Storm Honer, MD UNCDIABENDET [facility]

## 2024-07-11 NOTE — Progress Notes (Signed)
 DEXA scan done on 07/09/24 consistent with normal bone density as follows.    FINDINGS      Lumbar Spine       Excluded levels: none.       BMD:   1.079 (g/cm2)         T score: 0.9     Lumbar WHO classification: NORMAL BONE MINERAL DENSITY.          Left Hip         Femoral Neck:           BMD: 0.86 (g/cm2)           T score: 0.7       Total Hip           BMD: 0.98 (g/cm2)             T score: 0.7     Hip WHO classification: NORMAL BONE MINERAL DENSITY.     Plan to repeat DEXA at 1 year post-transplant per standard protocol.    Elizebeth Has, PharmD, BCOP  Clinical Pharmacist Practitioner, BMTCT

## 2024-07-12 ENCOUNTER — Ambulatory Visit: Admit: 2024-07-12 | Discharge: 2024-07-12 | Payer: MEDICARE | Attending: Primary Care | Primary: Primary Care

## 2024-07-12 ENCOUNTER — Other Ambulatory Visit: Admit: 2024-07-12 | Discharge: 2024-07-12 | Payer: MEDICARE

## 2024-07-12 DIAGNOSIS — C9100 Acute lymphoblastic leukemia not having achieved remission: Principal | ICD-10-CM

## 2024-07-12 DIAGNOSIS — Z9189 Other specified personal risk factors, not elsewhere classified: Principal | ICD-10-CM

## 2024-07-12 DIAGNOSIS — Z5181 Encounter for therapeutic drug level monitoring: Principal | ICD-10-CM

## 2024-07-12 DIAGNOSIS — D84822 Immunocompromised state associated with stem cell transplant    (CMS-HCC): Principal | ICD-10-CM

## 2024-07-12 DIAGNOSIS — R7989 Other specified abnormal findings of blood chemistry: Principal | ICD-10-CM

## 2024-07-12 DIAGNOSIS — Z9481 Bone marrow transplant status: Principal | ICD-10-CM

## 2024-07-12 DIAGNOSIS — Z794 Long term (current) use of insulin: Principal | ICD-10-CM

## 2024-07-12 DIAGNOSIS — Z9484 Stem cells transplant status: Principal | ICD-10-CM

## 2024-07-12 DIAGNOSIS — E1165 Type 2 diabetes mellitus with hyperglycemia: Principal | ICD-10-CM

## 2024-07-12 LAB — CBC W/ AUTO DIFF
BASOPHILS ABSOLUTE COUNT: 0 10*9/L (ref 0.0–0.1)
BASOPHILS RELATIVE PERCENT: 0.4 %
EOSINOPHILS ABSOLUTE COUNT: 0.2 10*9/L (ref 0.0–0.5)
EOSINOPHILS RELATIVE PERCENT: 4 %
HEMATOCRIT: 33.7 % — ABNORMAL LOW (ref 34.0–44.0)
HEMOGLOBIN: 11.5 g/dL (ref 11.3–14.9)
LYMPHOCYTES ABSOLUTE COUNT: 0.9 10*9/L — ABNORMAL LOW (ref 1.1–3.6)
LYMPHOCYTES RELATIVE PERCENT: 16.4 %
MEAN CORPUSCULAR HEMOGLOBIN CONC: 34.1 g/dL (ref 32.0–36.0)
MEAN CORPUSCULAR HEMOGLOBIN: 32.8 pg — ABNORMAL HIGH (ref 25.9–32.4)
MEAN CORPUSCULAR VOLUME: 96.2 fL — ABNORMAL HIGH (ref 77.6–95.7)
MEAN PLATELET VOLUME: 8.8 fL (ref 6.8–10.7)
MONOCYTES ABSOLUTE COUNT: 1 10*9/L — ABNORMAL HIGH (ref 0.3–0.8)
MONOCYTES RELATIVE PERCENT: 16.9 %
NEUTROPHILS ABSOLUTE COUNT: 3.6 10*9/L (ref 1.8–7.8)
NEUTROPHILS RELATIVE PERCENT: 62.3 %
PLATELET COUNT: 114 10*9/L — ABNORMAL LOW (ref 150–450)
RED BLOOD CELL COUNT: 3.5 10*12/L — ABNORMAL LOW (ref 3.95–5.13)
RED CELL DISTRIBUTION WIDTH: 18.7 % — ABNORMAL HIGH (ref 12.2–15.2)
WBC ADJUSTED: 5.7 10*9/L (ref 3.6–11.2)

## 2024-07-12 LAB — COMPREHENSIVE METABOLIC PANEL
ALBUMIN: 3.7 g/dL (ref 3.4–5.0)
ALKALINE PHOSPHATASE: 87 U/L (ref 46–116)
ALT (SGPT): 172 U/L — ABNORMAL HIGH (ref 10–49)
ANION GAP: 12 mmol/L (ref 5–14)
AST (SGOT): 112 U/L — ABNORMAL HIGH (ref <=34)
BILIRUBIN TOTAL: 0.3 mg/dL (ref 0.3–1.2)
BLOOD UREA NITROGEN: 17 mg/dL (ref 9–23)
BUN / CREAT RATIO: 22
CALCIUM: 9.4 mg/dL (ref 8.7–10.4)
CHLORIDE: 105 mmol/L (ref 98–107)
CO2: 26 mmol/L (ref 20.0–31.0)
CREATININE: 0.78 mg/dL (ref 0.55–1.02)
EGFR CKD-EPI (2021) FEMALE: >90 mL/min/1.73m2 (ref >=60)
GLUCOSE RANDOM: 246 mg/dL — ABNORMAL HIGH (ref 70–179)
POTASSIUM: 4.1 mmol/L (ref 3.4–4.8)
PROTEIN TOTAL: 6.3 g/dL (ref 5.7–8.2)
SODIUM: 143 mmol/L (ref 135–145)

## 2024-07-12 LAB — SLIDE REVIEW

## 2024-07-12 LAB — MAGNESIUM: MAGNESIUM: 1.3 mg/dL — ABNORMAL LOW (ref 1.6–2.6)

## 2024-07-12 NOTE — Patient Instructions (Addendum)
 Social worker: Suzen Frank   [phone]     A pharmacist will call you if I need to make changes to your tacrolimus  dose.     Liver enzymes are a little higher today: Please hold the fluconazole  (diflucan ) for now until we see your liver numbers again    Continue holding the Pepcid  and Dapsone     Will schedule the Pentamidine for next week to temporarily replace the Dapsone     -------------------------------------------------------------------------------  Return to clinic on Tuesday to see one of the providers. You will receive a time when you check out today.    Lab Results   Component Value Date    WBC 5.7 07/12/2024    HGB 11.5 07/12/2024    HCT 33.7 (L) 07/12/2024    PLT 114 (L) 07/12/2024     Lab Results   Component Value Date    NA 143 07/09/2024    K 4.0 07/09/2024    CL 109 (H) 07/09/2024    CO2 21.0 07/09/2024    BUN 20 07/09/2024    CREATININE 0.75 07/09/2024    GLU 321 (H) 07/09/2024    CALCIUM  9.3 07/09/2024    MG 1.3 (L) 07/09/2024    PHOS 4.5 06/26/2024     Lab Results   Component Value Date    BILITOT 0.2 (L) 07/09/2024    BILIDIR 0.10 06/26/2024    PROT 6.3 07/09/2024    ALBUMIN 3.6 07/09/2024    ALT 123 (H) 07/09/2024    AST 88 (H) 07/09/2024    ALKPHOS 82 07/09/2024    GGT 836 (H) 11/25/2023     Lab Results   Component Value Date    INR 1.15 06/26/2024    APTT 29.0 06/26/2024       For prescription refills:   For refills, please check your medication bottles to see if you have additional refills left. If so, please call your pharmacy and follow the directions to request a refill. If you do not have any refills left, please make a request during your clinic visit or by submitting a request through MyChart or by calling [phone]. Please allow 24 hours if your request is made during the week or 48 hours if requests are made on the weekends or holidays.     --------------------------------------------------------------------------------------------------------------------  For appointments & questions Monday through Friday 8 AM-4:30 PM     Please call [phone]     On Nights, Weekends and [employer] [phone]. This is the nurse's station. They will contact the provider for you.     Please visit [email or web address], a resource created just for family members and caregivers.  This website lists support services, how and where to ask for help. It has tools to assist you as you help us  care for your loved one.    [facility]  [address]  [place], [place] [address]  [email or web address]

## 2024-07-13 LAB — TACROLIMUS LEVEL: TACROLIMUS BLOOD: 9.2 ng/mL

## 2024-07-13 MED ORDER — TACROLIMUS 0.5 MG CAPSULE, IMMEDIATE-RELEASE
ORAL_CAPSULE | ORAL | 5 refills | 30.00000 days | Status: CP
Start: 2024-07-13 — End: ?

## 2024-07-13 NOTE — Progress Notes (Signed)
 Bone Marrow Transplant and Cellular Therapy Program  Immunosuppressive Therapy Note    Patricia [name] is a 48 y.o. 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 [name] is currently day 53.    Concurrent CYP3A4 inhibitors: Fluconazole , Letermovir      Current dose: 1 mg in AM and 1 mg in PM (decreased on 12/10 after holding PM dose on 12/9)     Goal tacrolimus  Level: 5-10 ng/mL    Resulted level: 9.2 ng/mL  drawn at 2:24 PM, patient confirmed that she did not take her AM dose of tacrolimus  until after lab draw in clinic. She also confirmed that she has been taking her tacrolimus  as prescribed.     Lab Results   Component Value Date/Time    TACROLIMUS  9.2 07/12/2024 02:24 PM    TACROLIMUS  12.2 07/09/2024 12:17 PM    TACROLIMUS  15.6 07/05/2024 11:06 AM    TACROLIMUS  10.1 06/27/2024 09:37 AM    TACROLIMUS  7.7 06/26/2024 08:21 AM    TACROLIMUS  9.7 06/24/2024 08:43 AM       Lab Results   Component Value Date/Time    CREATININE 0.78 07/12/2024 02:24 PM    CREATININE 0.75 07/09/2024 12:17 PM    CREATININE 0.75 07/07/2024 11:27 AM      - tacrolimus  level has trended down slightly following dose decrease however it is still supra-therapeutic today and estimated true trough is  higher when accounting for late lab draw  - SCr WNL and overall stable   - T bili 0.3 and AST/ALT remain elevated  - Fluconazole  was held starting 12/13 given LFT elevations  - given above, instructed patient to hold PM dose of tacrolimus  this evening (12/13) and decrease tacrolimus  dose to 0.5 mg PO qAM and 1 mg PO qPM starting tomorrow morning (07/14/24). Plan to recheck tacrolimus  level on Tuesday 12/16 in our clinic. Plan discussed with patient via phone and she verbalized understanding.     We will continue to monitor levels.  Patient will be followed for changes in renal and hepatic function, toxicity, and efficacy.     Harlene DELENA Hummer, PharmD  PGY2 Oncology Pharmacy Resident

## 2024-07-15 NOTE — Progress Notes (Signed)
 BMT Clinic Follow-up    [name]  MRN: [id number]  Encounter date: 07/16/2024    Referring Physician: Estelle Toribio SAUNDERS, MD  BMT Attending MD: Dr. Jeaneen    Transplant Day: 94     Reason for Visit: Post allogeneic transplant follow up. This visit included intensive monitoring of high-risk medications/disease states given risk of severe myelosuppression, immunosuppression, infection, and other toxicities. Review included CBC, electrolytes and other blood tests, as well as direct assessment of the patient for symptoms suggesting toxicity.    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 Her transplant course was complicated by T2DM with steroid induced hyperglycemia (stable on current regimen at discharge), prolonged Qtc on admission (resolved with last Qtc 460 from 11/20), non-infectious diarrhea (resolved without recent need for imodium ). 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 Received most recent dose of IVIG on 11/26.     Interval History  Patricia [name] is a 48 y.o. 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 Patricia [name] is now s/p a matched unrelated donor stem cell transplant      - She feels like she has a sinus infection (has history of chronic sinus infections). About a week ago started with ear congestion (not improving with trying to pop them), on going post nasal drip, congestion/sinus pain. She took Mucinex  yesterday during the day which helped some but then woke up with a dry/sore throat. Has had occasional cough and clearing her throat frequently. Mom has similar symptoms and now on prednisone . No fevers/chills. No SOB. Has used her albuterol  a few times the past few weeks for the cough.   - No nausea or vomiting  - No bowel movement yesterday, had one today that was soft/mushy.   - No rashes.  - Eating and drinking well. Trying to push fluid intake.   - BS have been not too bad. Averaging 179 over the past 3 days.   - On going back pain, using tramadol  PRN with good relief.     ID: Valtrex , Fluconazole  (Hold beginning 12/12 with elevated LFTs), Dapsone  (Held 12/9 due to elevated LFTs) will schedule next week for Pentamidine requested 12/19  Tacrolimus : Current dose 1 mg po bid    Physical Exam:  BP 113/87  - Pulse 87  - Temp 36.8 ??C (98.3 ??F) (Temporal)  - Resp 16  - Wt 76 kg (167 lb 8.8 oz)  - SpO2 100%  - BMI 26.93 kg/m??     70, Cares for self; unable to carry on normal activity or to do active work (ECOG equivalent 1)    General: No acute distress noted.   Central venous access: Line clean, dry, intact. No erythema or drainage noted.   HEENT: Moist mucous membranes. Oropharhynx without lesions, erythema or exudate. No tenderness to palpation over sinuses but reports it helps relieve the pressure when pushing on her sinuses.   Lymph: No palpable cervical or clavicular lymphadenopathy.   Cardiovascular: Pulse normal rate, regularity and rhythm. S1 and S2 normal, without any murmur, rub, or gallop.  Lungs: Clear to auscultation bilaterally, without wheezes/crackles/rhonchi. Good air movement.   Skin: Warm, dry, intact. No rash noted.    Psychiatry: Alert and oriented to person, place, and time.   Gastrointestinal/Abdomen: Normoactive bowel sounds, abdomen soft, non-tender   Musculoskeletal/Extremities: FROM throughout. No edema  Neurologic: CNII-XII intact. Normal strength and sensation throughout    Acute Graft versus Host Disease Assessment July 16, 2024  Organ stage:    Skin stage: 0 no active (erythematous) rash  Upper GI stage 0 No or intermittent nausea, vomiting or anorexia   Lower GI stage 0 No Diarrhea or <500 mL/day or < 3 episodes per day;   Hepatic stage 0 Bilirubin normal - 2mg /dL   *AST/ALT mildly elevated but likely secondary URI or medication     Overall grade (Current) Grade 0: No stage 1-4 of any organ  Max Grade of acute GVHD post-HCT: Grade 0: No stage 1-4 of any organ    Assessment/Plan:  1) SCT Summary:  - Type of Transplant: RIC MUD Allo (Fludarabine  40 mg/m2/Melphalan  100  mg/m2)  - Graft Source: Fresh PBSCs10/10, Female, ABO O-, CMV negative     2) Disease: ALL  - Pre BMT disease status: CR MRD Negative  - Signs/symptoms of disease relapse: None   - Restaging plan:    *Next BMBX: Day +90   *Next Chimerism: Day +30  (ordered for visit on 12/12)    Date Source Unfractionated CD3   07/12/24  PB pending pending               3) Immunosuppressed: High risk of infections, if patient develops a fever will admit to inpatient unit for IV antibiotics and fever workup   - Prophylaxis:          * Antiviral: Valtrex  500 mg po BID through 2 years post transplant.   Letermovir  480 mg po daily through day +200 to prevent CMV infections   * Antifungal: Fluconazole  400 mg po daily through day +75 (Hold beginning 12/12 with LFT elevations)   *12/16: AST improved ALT slightly. LFT increase coincides with Metformin  start (started 07/05/24). She also has parainfluenza and URIs can increase LFTs. Will keep her off fluconazole  until next follow up and if LFts improving/stable will restart.   * PJP: Sulfa allergic. Dapsone  daily upon platelet engraftment, has tolerated this well in the past. Toxo PCR negative. Start upon platelet engraftment.    -12/5: Hgb improved, will start Dapsone  100mg  daily. Dapsone  held on 12/9 due to increasing LFTs will schedule for Pentamidine next week, requested 12/19.     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   Previously required Valcyte  treatment. 11/16 CMV viral load undetectable.  - Continue Letermovir  and Valtrex  BID through day +200 post SCT.     Recurrent Acute Sinusitis  Recurrent acute sinusitis with nasal congestion, frontal headache, and low-grade fever. Responded well to Augmentin .  -11/7 current sx resolving, started Augmentin  10 days (10/30-11/9)    Parainfluenza:  -12/16: congestion, cough, sinus pressure. RPP +parainfluenza. Patricia [name] will reach out to the patient to discuss pulmotect trial. Continue with symptomatic treatment for now.     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   - Prophylaxis:  - Methotrexate  5 mg/m2 IVP on days +1, +3, +6 and +11  - Tacrolimus      * Goal trough: 5-10     * High risk drug monitoring and dose adjustments per BMT Pharmacist    * Signs/symptoms of toxicity: None      5) Heme  - Transfusion criteria: Transfuse 1 unit of PRBCs for hemoglobin <7 and 1 unit of  platelets for Plt <10K or bleeding. Patricia [name]. Patricia [name] does not have a history of transfusion reactions. Consent was obtained and documented on 06/05/24.   - Anemia: Secondary to chemotherapy. Now resolved, HGb 11.3  - Thrombocytopenia: Secondary to chemotherapy. Now improving. Plts have been stable in the 110s recently.   - Leukopenia: WBC/ANC both decreased today, likely in the setting of parainfluenza. Continue to monitor.     6) GI/Liver:   GERD prophylaxis: Pepcid  BID  Nausea:PRN zofran  and compazine   Diarrhea: PRN Imodium , has not needed recently     Elevated liver enzymes:  - Started trending up on 12/5 with mildly elevated AST  - On 12/9 both AST and ALT elevated- Dapsone  and Pepcid  held  - 12/12: AST 112 and ALT 172 normal bili. Will hold Fluconazole  until next visit to re-evaluate. Sending adeno and HHV-6. CMV and EBV have been negative.   - 12/16: AST improved, ALT stable. Like up from URI (tested positive for parainfluenza today). Possibly secondary medication (metformin , fluc, dapsone , and pepcid ). Fluc, dapsone , and pepcid  on hold. If LFts stable/improved next visit would restart fluconazole .      7) Hypomagnesemia:   - Mg is 1.4, stable on current dose 2 tabs AM , 2 tabs afternoon, and 3 tabs at bedtime  Trying to be cautious with oral magnesium  increase given it upsets her stomach. BM now normalized so if Mg drops again could increase oral magnesium  PRN.     8)  Heart failure with mildly reduced ejection fraction (HFmrEF)   Followed by [facility] Cardiology:  Due to anythracycline use  Hx of EF 45-50%, most recent Echo 05/23/24 improved to 55-60%  Hx of prolonged QTc  - Home med Losartan  50 mg daily (held 11/18)  - BP stable, continue to omnitor.     BP Readings from Last 3 Encounters:   07/16/24 113/87   07/12/24 113/77   07/09/24 118/90      9) Pulm/Allergies   - cetirizine  10 mg daily   - albuterol  PRN     10) Menses suppression: Lupron  last 07/26/22. No menses      11) Type 2 DM exacerbated by steroids: Followed by [facility] Endocrinology. At home uses Dexcom for monitoring. Has follow up with Endocrinology on 07/05/24.   - Increase insulin  glargine from 12u to 15u once daily  - Continue insulin  aspart with meals per carb counting (1 unit for every 8 grams of carbs)  - Endocrine added Metformin  500 mg XR daily on 07/05/24. Next follow up 08/28/23    12) Pain:  - PRN tramadol  50 mg. No oxy as this give patient migraines     13) Anxiety:  Lexapro  10 mg daily (has PRN ativan  available but has not needed)  Reconnected with her local therapist      14) DispoCaregivers: Lives in [place], [place]  Primary caregiver: Jeanie Tate (mom)- first 30 days at [facility] then home if doing well will stay longer if medically needed  Secondary caregiver: Lashaun Poch (husband)    15) Bone Health: DEXA 07/09/24: Results with normal bone density    Plan:  - Good engraftment. Chimerism pending from 07/12/24  - RPP + parainfluenza. Patricia [name] will reach out to her to discuss the Pulmotect trial.   - LFTs stable. Likely up from URI vs medication. No other e/o of GvHD. If LFTs stable/improved next visit would restart fluconazole . OK to continue to hold pepcid  and Dapsone  (plan for Pentamidine on 12/19) .   - CVL removal 12/17 (  has port)   - RTC Friday     I personally spent 47 minutes face-to-face and non-face-to-face in the care of this patient, which includes all pre, intra, and post visit time on the date of service.  All documented time was specific to the E/M visit and does not include any procedures that may have been performed.  Time was spent on clinical assessment, placing orders / referrals, and/or prescription management, all pertaining to the diagnoses listed above.       Duwaine Abrahams, PA  [facility]      Future Appointments   Date Time Provider Department Center   07/17/2024 10:00 AM NELS IR RM 3B [id number] [place] [facility] [place]   07/19/2024  1:00 PM ONCBMT LABS [facility]ON[facility]   07/19/2024  2:00 PM ONCBMT APP B [facility]   07/19/2024  3:15 PM ONCINF CHAIR 07 HONC3UCA [facility]   07/22/2024  2:00 PM ONCBMT LABS [facility]ON[facility]MT [facility]   07/22/2024  3:00 PM ONCBMT APP A [facility]   07/24/2024 11:00 AM ONCBMT LABS [facility]   07/24/2024 12:00 PM ONCBMT APP A [facility]   07/30/2024 12:15 PM ONCBMT LABS [facility]   07/30/2024  1:00 PM ONCBMT APP A [facility]   08/02/2024 12:00 PM ONCBMT LABS [facility]   08/02/2024  1:00 PM ONCBMT APP B [facility]   08/06/2024 12:00 PM ONCBMT LABS [facility]   08/06/2024  1:00 PM ONCBMT APP A [facility]   08/08/2024  7:30 AM ONCINF CHAIR 18 HONC3UCA [facility]   08/09/2024 12:00 PM ONCBMT LABS [facility]ON[facility]MT [facility]   08/09/2024  1:00 PM ONCBMT APP A [facility]   08/13/2024  9:30 AM ONCBMT LABS HON[facility]MT [facility]   08/13/2024 10:30 AM Patricia [name] Dossie Craze, MD HONCBMT [facility]   08/13/2024  1:25 PM Delores Fleeting, MD UNCHRTVASET [facility]   08/27/2024  9:50 AM Kome, Arlean HERO, CPP UNCDIABENDET [facility]   09/05/2024 10:00 AM Jarold Motto T UNCDIABENDET [facility]   09/10/2024  9:00 AM ONCBMT LABS HONCBMT [facility]   09/10/2024 10:00 AM ONCBMT APP A [facility]MT [facility]   09/10/2024 11:00 AM Polly Alyce Dover, MD ONCMULTI [facility] 09/10/2024 12:00 PM ONCBMT PROCEDURES HONC3UCA [facility]   09/17/2024  9:15 AM ONCBMT LABS [facility]ONCBMT [facility]   09/17/2024 10:00 AM Patricia [name] Dossie Craze, MD HONCBMT [facility]   09/17/2024 11:00 AM CARSER EKG HVCARD2UMH [facility]   09/24/2024  8:15 AM ONCBMT LABS HONCBMT [facility]   09/24/2024  9:00 AM ONCBMT APP A [facility]   09/24/2024 10:00 AM ONCBMT PHARMACY HONCBMT [facility]   09/24/2024 10:30 AM ONCBMT COORDINATOR HONCBMT [facility]   12/10/2024  9:30 AM ONCBMT LABS HONCBMT [facility]   12/10/2024 10:30 AM ONCBMT PHARMACY HONCBMT [facility]   12/27/2024  9:40 AM Storm Honer, MD UNCDIABENDET [facility]

## 2024-07-15 NOTE — Telephone Encounter (Signed)
 VIR pre procedure prep call completed. Reviewed to arrive 1 hour prior to appointment time- 0900. Informed of no show/late cancellation policy. Pt verbalized understanding. All questions answered.

## 2024-07-15 NOTE — Progress Notes (Signed)
 [facility] Refill Coordination Note    Patricia [name], DOB: [birth year]  Phone: [phone] (home)       All above HIPAA information was verified with patient.         07/15/2024     2:53 PM   Specialty Rx Medication Refill Questionnaire   Which Medications would you like refilled and shipped? Prevymis    Please list all current allergies: Nothing new   Have you missed any doses in the last 30 days? No   Have you had any changes to your medication(s) since your last refill? No   How much of each medication do you have remaining at home? (eg. number of tablets, injections, etc.) 7 days   Have you experienced any side effects in the last 30 days? No   Please enter the full address (street address, city, state, zip code) where you would like your medication(s) to be delivered to. [address], [place] [address]   Please specify on which day you would like your medication(s) to arrive. Note: if you need your medication(s) within 3 days, please call the pharmacy to schedule your order at [phone]  07/19/2024   Has your insurance changed since your last refill? No   Would you like a pharmacist to call you to discuss your medication(s)? No   Do you require a signature for your package? (Note: if we are billing Medicare Part B or your order contains a controlled substance, we will require a signature) No   I have been provided my out of pocket cost for my medication and approve the pharmacy to charge the amount to my credit card on file. Yes         Completed refill call assessment today to schedule patient's medication shipment from the [facility] [phone]).  All relevant notes have been reviewed.       Confirmed patient received a Conservation Officer, Historic Buildings and a Surveyor, Mining with first shipment. The patient will receive a drug information handout for each medication shipped and additional FDA Medication Guides as required.         REFERRAL TO PHARMACIST     Referral to the pharmacist: Not needed      [facility]     Shipping address confirmed in Epic.     Delivery Scheduled: Yes, Expected medication delivery date: 07/19/24.     Medication will be delivered via Next Day Courier to the prescription address in Epic [place].    Tyerra Loretto M Santer Torres   [facility] Specialty Technician

## 2024-07-16 ENCOUNTER — Ambulatory Visit: Admit: 2024-07-16 | Discharge: 2024-07-17 | Payer: MEDICARE

## 2024-07-16 ENCOUNTER — Other Ambulatory Visit: Admit: 2024-07-16 | Discharge: 2024-07-17 | Payer: MEDICARE

## 2024-07-16 DIAGNOSIS — C9100 Acute lymphoblastic leukemia not having achieved remission: Principal | ICD-10-CM

## 2024-07-16 DIAGNOSIS — R0981 Nasal congestion: Principal | ICD-10-CM

## 2024-07-16 DIAGNOSIS — D72819 Decreased white blood cell count, unspecified: Principal | ICD-10-CM

## 2024-07-16 DIAGNOSIS — Z9481 Bone marrow transplant status: Principal | ICD-10-CM

## 2024-07-16 DIAGNOSIS — Z9484 Stem cells transplant status: Principal | ICD-10-CM

## 2024-07-16 DIAGNOSIS — D84822 Immunocompromised state associated with stem cell transplant    (CMS-HCC): Principal | ICD-10-CM

## 2024-07-16 LAB — COMPREHENSIVE METABOLIC PANEL
ALBUMIN: 3.8 g/dL (ref 3.4–5.0)
ALKALINE PHOSPHATASE: 88 U/L (ref 46–116)
ALT (SGPT): 184 U/L — ABNORMAL HIGH (ref 10–49)
ANION GAP: 12 mmol/L (ref 5–14)
AST (SGOT): 98 U/L — ABNORMAL HIGH (ref <=34)
BILIRUBIN TOTAL: 0.2 mg/dL — ABNORMAL LOW (ref 0.3–1.2)
BLOOD UREA NITROGEN: 10 mg/dL (ref 9–23)
BUN / CREAT RATIO: 14
CALCIUM: 9.7 mg/dL (ref 8.7–10.4)
CHLORIDE: 103 mmol/L (ref 98–107)
CO2: 25 mmol/L (ref 20.0–31.0)
CREATININE: 0.74 mg/dL (ref 0.55–1.02)
EGFR CKD-EPI (2021) FEMALE: >90 mL/min/1.73m2 (ref >=60)
GLUCOSE RANDOM: 167 mg/dL (ref 70–179)
POTASSIUM: 4.1 mmol/L (ref 3.4–4.8)
PROTEIN TOTAL: 6.8 g/dL (ref 5.7–8.2)
SODIUM: 140 mmol/L (ref 135–145)

## 2024-07-16 LAB — CBC W/ AUTO DIFF
BASOPHILS ABSOLUTE COUNT: 0.1 10*9/L (ref 0.0–0.1)
BASOPHILS RELATIVE PERCENT: 1.8 %
EOSINOPHILS ABSOLUTE COUNT: 0.2 10*9/L (ref 0.0–0.5)
EOSINOPHILS RELATIVE PERCENT: 6.3 %
HEMATOCRIT: 33.6 % — ABNORMAL LOW (ref 34.0–44.0)
HEMOGLOBIN: 11.3 g/dL (ref 11.3–14.9)
LYMPHOCYTES ABSOLUTE COUNT: 0.7 10*9/L — ABNORMAL LOW (ref 1.1–3.6)
LYMPHOCYTES RELATIVE PERCENT: 22.3 %
MEAN CORPUSCULAR HEMOGLOBIN CONC: 33.7 g/dL (ref 32.0–36.0)
MEAN CORPUSCULAR HEMOGLOBIN: 32.5 pg — ABNORMAL HIGH (ref 25.9–32.4)
MEAN CORPUSCULAR VOLUME: 96.5 fL — ABNORMAL HIGH (ref 77.6–95.7)
MEAN PLATELET VOLUME: 10.4 fL (ref 6.8–10.7)
MONOCYTES ABSOLUTE COUNT: 1 10*9/L — ABNORMAL HIGH (ref 0.3–0.8)
MONOCYTES RELATIVE PERCENT: 30.8 %
NEUTROPHILS ABSOLUTE COUNT: 1.3 10*9/L — ABNORMAL LOW (ref 1.8–7.8)
NEUTROPHILS RELATIVE PERCENT: 38.8 %
PLATELET COUNT: 117 10*9/L — ABNORMAL LOW (ref 150–450)
RED BLOOD CELL COUNT: 3.49 10*12/L — ABNORMAL LOW (ref 3.95–5.13)
RED CELL DISTRIBUTION WIDTH: 17.9 % — ABNORMAL HIGH (ref 12.2–15.2)
WBC ADJUSTED: 3.2 10*9/L — ABNORMAL LOW (ref 3.6–11.2)

## 2024-07-16 LAB — CMV DNA, QUANTITATIVE, PCR: CMV VIRAL LD: NOT DETECTED

## 2024-07-16 LAB — IGG: GAMMAGLOBULIN; IGG: 773 mg/dL (ref 650–1600)

## 2024-07-16 LAB — MAGNESIUM: MAGNESIUM: 1.4 mg/dL — ABNORMAL LOW (ref 1.6–2.6)

## 2024-07-16 LAB — SLIDE REVIEW

## 2024-07-16 LAB — EBV QUANTITATIVE PCR, BLOOD: EBV VIRAL LOAD RESULT: NOT DETECTED

## 2024-07-16 NOTE — Patient Instructions (Signed)
 It was great seeing you today.     Let's get a respiratory swab today to rule out a viral infection. If this is negative I will send in augmentin  for you to take for possible sinus infection.     A pharmacist will call you if I need to make changes to your tacrolimus  dose.     -------------------------------------------------------------------------------  Return to clinic on Friday to see one of the providers as scheduled.     Lab Results   Component Value Date    WBC 3.2 (L) 07/16/2024    HGB 11.3 07/16/2024    HCT 33.6 (L) 07/16/2024    PLT 117 (L) 07/16/2024     Lab Results   Component Value Date    NA 140 07/16/2024    K 4.1 07/16/2024    CL 103 07/16/2024    CO2 25.0 07/16/2024    BUN 10 07/16/2024    CREATININE 0.74 07/16/2024    GLU 167 07/16/2024    CALCIUM  9.7 07/16/2024    MG 1.4 (L) 07/16/2024    PHOS 4.5 06/26/2024     Lab Results   Component Value Date    BILITOT 0.2 (L) 07/16/2024    BILIDIR 0.10 06/26/2024    PROT 6.8 07/16/2024    ALBUMIN 3.8 07/16/2024    ALT 184 (H) 07/16/2024    AST 98 (H) 07/16/2024    ALKPHOS 88 07/16/2024    GGT 836 (H) 11/25/2023     Lab Results   Component Value Date    INR 1.15 06/26/2024    APTT 29.0 06/26/2024       For prescription refills:   For refills, please check your medication bottles to see if you have additional refills left. If so, please call your pharmacy and follow the directions to request a refill. If you do not have any refills left, please make a request during your clinic visit or by submitting a request through MyChart or by calling [phone]. Please allow 24 hours if your request is made during the week or 48 hours if requests are made on the weekends or holidays.     --------------------------------------------------------------------------------------------------------------------  For appointments & questions Monday through Friday 8 AM-4:30 PM     Please call [phone]     On Nights, Weekends and [place] [phone]. This is the nurse's station. They will contact the provider for you.     Please visit Privacyfever.cz, a resource created just for family members and caregivers.  This website lists support services, how and where to ask for help. It has tools to assist you as you help us  care for your loved one.    [facility]  [address]  [place], [place] [address]  [email or web address]

## 2024-07-17 DIAGNOSIS — C9100 Acute lymphoblastic leukemia not having achieved remission: Principal | ICD-10-CM

## 2024-07-17 DIAGNOSIS — R051 Acute cough: Principal | ICD-10-CM

## 2024-07-17 DIAGNOSIS — D84822 Immunocompromised state associated with stem cell transplant    (CMS-HCC): Principal | ICD-10-CM

## 2024-07-17 DIAGNOSIS — Z9484 Stem cells transplant status: Principal | ICD-10-CM

## 2024-07-17 LAB — TACROLIMUS LEVEL: TACROLIMUS BLOOD: 6.8 ng/mL

## 2024-07-17 LAB — HSV 1,2 PCR, BLOOD: HSV 1 AND 2 PCR: NEGATIVE

## 2024-07-17 NOTE — Progress Notes (Signed)
 Bone Marrow Transplant and Cellular Therapy Program  Immunosuppressive Therapy Note    Patricia [name] is a 48 y.o. 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 Ms. Thorson is currently day +35.    Concurrent CYP3A4 inhibitors:  - Letermovir   - Fluconazole  on hold starting 12/13 due to elevated LFTs     Current dose: Held x1 dose (12/13 PM), then resumed reduced dose of 0.5 mg qAM + 1 mg qPM starting 12/14 AM     Goal tacrolimus  Level: 5-10 ng/mL    Resulted level: 6.8 ng/mL drawn on 12/16 at 12:56 PM, resulted on 12/17    Lab Results   Component Value Date/Time    TACROLIMUS  6.8 07/16/2024 12:56 PM    TACROLIMUS  9.2 07/12/2024 02:24 PM    TACROLIMUS  12.2 07/09/2024 12:17 PM    TACROLIMUS  10.1 06/27/2024 09:37 AM    TACROLIMUS  7.7 06/26/2024 08:21 AM    TACROLIMUS  9.7 06/24/2024 08:43 AM       Lab Results   Component Value Date/Time    CREATININE 0.74 07/16/2024 12:56 PM    CREATININE 0.78 07/12/2024 02:24 PM    CREATININE 0.75 07/09/2024 12:17 PM      - given afternoon lab draw, true tacrolimus  trough higher and likely within the upper end of her therapeutic goal range; level has trended down following most recent held dose x1 (12/13 PM) and dose reduction starting 12/14; level likely not at steady state yet on new dose  - SCr WNL and overall stable   - T bili WNL; AST/ALT remain elevated, overall stable from prior  - Fluconazole  was held starting 12/13 given LFT elevations; as this washes out, may also be contributing to downtrend in level  - Given the above, plan to continue tacrolimus  0.5 mg qAM + 1 mg qPM and plan to recheck level and CMP at next clinic visit    We will continue to monitor levels.  Patient will be followed for changes in renal and hepatic function, toxicity, and efficacy.     Joesph Hamman, PharmD, CPP  [facility] Clinical Pharmacist Practitioner

## 2024-07-17 NOTE — Progress Notes (Signed)
 Bone Marrow Transplant Research Note:  Date: 07/17/24    Clinical Study:  [id number]  Main Consent: Informed consent documentation     [name] is potentially eligible for the [id number] study: A Phase 2 Multiple Dose Study to Evaluate the Efficacy and Safety of PUL-042 Inhalation Solution in Reducing Lower Respiratory Tract Complications in Patients with Hematologic Malignancies and Recipients of Hematopoietic Stem Cell Transplantation (HSCT) with Documented Viral Infections with Parainfluenza Virus (PIV), Human Metapneumovirus (hMPV) or Respiratory Syncytial Virus (RSV). Duwaine Abrahams presented the study to  [name] on 07/16/24 and she expressed interest. Rolin Banister and I presented the study to  [name] on 07/17/24 over the phone and the patient verbally consented.     We reviewed the protocol and the purpose of the study- to see whether an experimental drug, PUL-042 Inhalation Solution (PUL-042), is effective in reducing the severity of lung infections    We thoroughly reviewed the protocol and consent forms, including the risks and benefits involved. I informed the patient that she had the option to withdraw consent at any time without any penalty. The patient was told that every effort to maintain her confidentiality will be employed. The patient was given a reasonable amount of time to consider participation in the study. She was offered an opportunity for questions, and those questions were answered. The patient verbalized understanding of the information presented and signed the informed consent of her own free will in my presence. She was told that if she had any questions pertaining to the study, she should ask me, Dr. Katarzyna Jamieson, or any of the study personnel/ investigators.    No study-related procedures were conducted prior to the patient giving verbal consent. Prior to written consent we scheduled a clinic appointment and ordered a chest x-ray for today. While in clinic, we re-consented the patient and had her sign the consent forms. A copy of the signed informed consents and Patricia [name] business card were given to the patient. The original will remain in the patient's research chart.      Rolin will provide coverage while I am out of town for the holidays, so she should be contacted for all study issues prior to Sunday, Dec 28th. Patient eligibility will be confirmed prior to starting therapy on Friday, Dec 19th.    Date consents signed:  07/17/24

## 2024-07-17 NOTE — Progress Notes (Signed)
 Outpatient BMT SW Note: SW met with pt in clinic today. Pt inquired if she may be eligible to apply for financial assistance through [employer] for post transplant grant. SW offered to follow up with pt via phone tomorrow to try and complete this grant online.    Pt was appreciative.

## 2024-07-18 ENCOUNTER — Inpatient Hospital Stay: Admit: 2024-07-18 | Discharge: 2024-07-18 | Payer: MEDICARE

## 2024-07-18 ENCOUNTER — Ambulatory Visit: Admit: 2024-07-18 | Discharge: 2024-07-18 | Payer: MEDICARE

## 2024-07-18 DIAGNOSIS — C9100 Acute lymphoblastic leukemia not having achieved remission: Principal | ICD-10-CM

## 2024-07-18 LAB — ADENOVIRUS PCR BLOOD: ADENO VIRAL LOAD: NOT DETECTED

## 2024-07-18 LAB — HHV-6 PCR, BLOOD: HHV6 VIRAL LOAD: NOT DETECTED

## 2024-07-18 NOTE — Progress Notes (Signed)
 BMT Clinic Follow-up    [name]  MRN: [id number]  Encounter date: 07/19/2024    Referring Physician: Estelle Toribio SAUNDERS, MD  BMT Attending MD: Dr. Jeaneen    Transplant Day: 54     Reason for Visit: Post allogeneic transplant follow up. This visit included intensive monitoring of high-risk medications/disease states given risk of severe myelosuppression, immunosuppression, infection, and other toxicities. Review included CBC, electrolytes and other blood tests, as well as direct assessment of the patient for symptoms suggesting toxicity.    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 Her transplant course was complicated by T2DM with steroid induced hyperglycemia (stable on current regimen at discharge), prolonged Qtc on admission (resolved with last Qtc 460 from 11/20), non-infectious diarrhea (resolved without recent need for imodium ). 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 Received most recent dose of IVIG on 11/26.     Interval History  Patricia [name] is a 48 y.o. 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 Patricia [name] is now s/p a matched unrelated donor stem cell transplant      - Mild expiratory wheeze.   - Still with productive cough (not actually spitting out mucus). Using Albuterol  about twice a day.  - Still with nasal congestion/pressure. Taking Mucinex  sparingly, about once per day.   - On going sore throat from post nasal drip. Worse in AM after first waking up  - Started with raspy voice yesterday.  - Temp 99.0 last night but no true fevers.   - Has some DOE/tightness with walking but no SOB at rest.   - Mild altered taste but still eating well  - Hasn't been great about remembering to drink.   - Has had a headache today which could be secondary to URI, poor fluid intake, or medication (tramadol  or zofran ).   - Mild occasional nausea but has only needed zofran  once the past week. NO vomiting or diarrhea.   - BS have been fair. Running 180-200, average 188. Continues on SSi and lantus  15 units with meals.   - No rashes, dry/itchy skin improved with stopping her current body wash.   - Consented to Pulmotect study with plans for first nebulizer treatment today. Discussed if symptoms still persisting on Monday we can consider Augmentin  for sinusitis.     ID: Valtrex , Fluconazole  (Hold beginning 12/12 with elevated LFTs), Dapsone  (Held 12/9 due to elevated LFTs). Scheduled for pentamidine 12/19 but delayed with URI symptoms will reschedule for 12/24  Tacrolimus : Current dose 1 mg po bid    Physical Exam:  BP 133/95  - Pulse 92  - Temp 37 ??C (98.6 ??F) (Oral)  - Resp 16  - SpO2 100%     70, Cares for self; unable to carry on normal activity or to do active work (ECOG equivalent 1)    General: No acute distress noted.   Central venous access: Line clean, dry, intact. No erythema or drainage noted.   ENT: Moist mucous membranes. Oropharhynx without lesions, erythema or exudate.   Cardiovascular: Pulse normal rate, regularity and rhythm. S1 and S2 normal, without any murmur, rub, or gallop.  Lungs: Clear to auscultation bilaterally, without wheezes/crackles/rhonchi. Good air movement.   Skin: Warm, dry, intact. No rash noted.    Psychiatry: Alert and oriented to person, place, and time.   Gastrointestinal/Abdomen: Normoactive bowel sounds, abdomen soft, non-tender   Musculoskeletal/Extremities: FROM throughout. No edema  Neurologic: CNII-XII intact. Normal strength  and sensation throughout    Acute Graft versus Host Disease Assessment July 19, 2024  Organ stage:    Skin stage: 0 no active (erythematous) rash  Upper GI stage 0 No or intermittent nausea, vomiting or anorexia   Lower GI stage 0 No Diarrhea or <500 mL/day or < 3 episodes per day;   Hepatic stage 0 Bilirubin normal - 2mg /dL   *AST/ALT elevated but likely secondary to pararinfluenza URI    Overall grade (Current) Grade 0: No stage 1-4 of any organ  Max Grade of acute GVHD post-HCT: Grade 0: No stage 1-4 of any organ    Assessment/Plan:  1) SCT Summary:  - Type of Transplant: RIC MUD Allo (Fludarabine  40 mg/m2/Melphalan  100  mg/m2)  - Graft Source: Fresh PBSCs10/10, Female, ABO O-, CMV negative     2) Disease: ALL  - Pre BMT disease status: CR MRD Negative  - Signs/symptoms of disease relapse: None   - Restaging plan:    *Next BMBX: Day +90   *Next Chimerism: Day +30  (ordered for visit on 12/12)    Date Source Unfractionated CD3   07/12/24  PB >95% 93               3) Immunosuppressed: High risk of infections, if patient develops a fever will admit to inpatient unit for IV antibiotics and fever workup   - Prophylaxis:          * Antiviral: Valtrex  500 mg po BID through 2 years post transplant.   Letermovir  480 mg po daily through day +200 to prevent CMV infections   * Antifungal: Fluconazole  400 mg po daily through day +75 (Hold beginning 12/12 with LFT elevations)   *12/19: LFts worsened today, likely in setting of Parainfluenza URI. If stable/improved next visit will rechallenge Fluconazole . If worsened LFTs will need to setup IV micafungin.   * PJP: Sulfa allergic. Dapsone  daily upon platelet engraftment, has tolerated this well in the past. Toxo PCR negative. Start upon platelet engraftment.    -12/5: Hgb improved, will start Dapsone  100mg  daily. Dapsone  held on 12/9 due to increasing LFTs will scheduled for Pentamidine 12/19 but delayed due to parainfluenza. Can reschedule once symptoms improve or restart Dapsone  if LFTs improve.     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   Previously required Valcyte  treatment. 11/16 CMV viral load undetectable.  - Continue Letermovir  and Valtrex  BID through day +200 post SCT.     Recurrent Acute Sinusitis  Recurrent acute sinusitis with nasal congestion, frontal headache, and low-grade fever. Responded well to Augmentin .  -11/7 current sx resolving, started Augmentin  10 days (10/30-11/9)    Parainfluenza:  -12/16: congestion, cough, sinus pressure. RPP +parainfluenza. Consented to pulmotect study.   - 12/19: 1st nebulizer treatment on Pumotect study, plan for 2nd treatment 12/22 and 3rd treatment 12/23.     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   - Prophylaxis:  - Methotrexate  5 mg/m2 IVP on days +1, +3, +6 and +11  - Tacrolimus      * Goal trough: 5-10     * High risk drug monitoring and dose adjustments per BMT Pharmacist    * Signs/symptoms of toxicity: None  5) Heme  - Transfusion criteria: Transfuse 1 unit of PRBCs for hemoglobin <7 and 1 unit of platelets for Plt <10K or bleeding. [name] Goh does not have a history of transfusion reactions. Consent was obtained and documented on 06/05/24.   - Anemia: Now normalized.   - Thrombocytopenia: Secondary to chemotherapy. Slowly improving.   - Leukopenia: WBC/ANC stable.     6) GI/Liver:   GERD prophylaxis: Pepcid  BID  Nausea:PRN zofran  and compazine   Diarrhea: PRN Imodium , has not needed recently     Elevated liver enzymes:  - Started trending up on 12/5 with mildly elevated AST  - On 12/9 both AST and ALT elevated- Dapsone  and Pepcid  held  - 12/12: AST 112 and ALT 172 normal bili. Will hold Fluconazole  until next visit to re-evaluate. Sending adeno and HHV-6. CMV and EBV have been negative.   - 12/16: AST improved, ALT stable. Like up from URI (tested positive for parainfluenza today). Possibly secondary medication (metformin , fluc, dapsone , and pepcid ). Fluc, dapsone , and pepcid  on hold. If LFts stable/improved next visit would restart fluconazole .   - 12/19: AST/ALT worsened today, likely secondary to parainfluenza. Repeat labs on Monday if LFTs stable/imrpoved plan to re-challenge fluc, if worsen will need to setup IV micafungin.      7) Hypomagnesemia:   - Mg is 1.4, stable on current dose 2 tabs TID  Trying to be cautious with oral magnesium  increase given it upsets her stomach. BM now normalized so if Mg drops again could increase oral magnesium  PRN. She does not tolerate IV magnesium  well (nausea)     8)  Heart failure with mildly reduced ejection fraction (HFmrEF)   Followed by [facility]:  Due to anythracycline use  Hx of EF 45-50%, most recent Echo 05/23/24 improved to 55-60%  Hx of prolonged QTc  - Home med Losartan  50 mg daily (held 11/18)  - BP stable has been stable, but DBP up slightly today but may be secondary to decongestants. . Continue to monitor.     BP Readings from Last 3 Encounters:   07/19/24 120/84   07/19/24 133/95   07/17/24 112/83      9) Pulm/Allergies   - cetirizine  10 mg daily   - albuterol  PRN     10) Menses suppression: Lupron  last given 07/26/22. No menses sense (peri-menopausal/menopausal)  - 12/19: screening urine pregnancy test per study resulted as positive, repeat urine test indeterminate. Serum beta HcG 5.9. Low level Hcg can often be seen in peri-menopausal women who are not pregnant. She has had chemotherapy and only 1 month post transplant so would consider this a negative pregnancy screen.      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 At home uses Dexcom for monitoring. Has follow up with Endocrinology on 07/05/24.   - Increase insulin  glargine from 12u to 15u once daily  - Continue insulin  aspart with meals per carb counting (1 unit for every 8 grams of carbs)  - Endocrine added Metformin  500 mg XR daily on 07/05/24. Next follow up 08/28/23    12) Pain:  - PRN tramadol  50 mg. No oxy as this give patient migraines     13) Anxiety:  Lexapro  10 mg daily (has PRN ativan  available but has not needed)  Reconnected with her local therapist      14) DispoCaregivers: Lives in [place], [place]  Primary caregiver: Jeanie Tate (mom)- first 30 days at [facility] then home if doing well will stay longer  if medically needed  Secondary caregiver: Attallah Ontko (husband)    15) Bone Health: DEXA 07/09/24: Results with normal bone density    Plan:  - Parainfluenza: Now on pulmotect study, first treatment today. Continue mucinex  and albuterol  PRN. If no improvement in symptoms by Monday will start Augmentin  for possible secondary sinusitis.   - LFTS worsened today but likely secondary to parainfluenza. Hold fluconazole  until next visit, if improved LFTs rechallenge. If worsened LFTs will plan for IV micafungin. If LFTS remain elevated once URI improved, will need further workup for GvHD.   - Continue to hold dapsone  with transaminitis. WIll reschedule Pentamdine once URI symptoms improved.   - CVL removal pushed to 12/23 with current parainfluenza infection (has port)   - RTC Monday     I personally spent 80 minutes face-to-face and non-face-to-face in the care of this patient, which includes all pre, intra, and post visit time on the date of service.  All documented time was specific to the E/M visit and does not include any procedures that may have been performed.  Time was spent on clinical assessment, placing orders / referrals, and/or prescription management, all pertaining to the diagnoses listed above.       Duwaine Abrahams, PA  [facility]      Future Appointments   Date Time Provider Department Center   07/22/2024 11:15 AM ONCBMT LABS [facility]   07/22/2024 12:00 PM Nannie Starzyk Royall, PA [facility]   07/22/2024  1:30 PM ONCINF CHAIR 08 HONC3UCA [facility]   07/23/2024 10:00 AM NELS IR RM 3B [facility]   07/23/2024 11:30 AM ONCINF CHAIR 09 HON[facility]3U[facility]   07/24/2024 11:00 AM ONCBMT LABS [facility]   07/24/2024 12:00 PM ONCBMT APP A [facility]   07/30/2024 12:15 PM ONCBMT LABS [facility]   07/30/2024  1:00 PM ONCBMT APP A [facility]   08/02/2024 12:00 PM ONCBMT LABS [facility]   08/02/2024 1:00 PM ONCBMT APP B [facility]   08/06/2024 12:00 PM ONCBMT LABS [facility]   08/06/2024  1:00 PM ONCBMT APP A [facility]   08/08/2024  7:30 AM ONCINF CHAIR 18 [facility]ON[facility]3U[facility]   08/09/2024 12:00 PM ONCBMT LABS [facility]   08/09/2024  1:00 PM ONCBMT APP A [facility]   08/13/2024  9:30 AM ONCBMT LABS [facility]   08/13/2024 10:30 AM Patricia [name] Dossie Craze, MD [facility]   08/13/2024  1:25 PM Delores Fleeting, MD UNCHRTVASET [facility]   08/27/2024  9:50 AM Kome, Arlean HERO, CPP UNCDIABENDET [facility]   09/05/2024 10:00 AM Jarold Motto T UNCDIABENDET [facility]   09/10/2024  9:00 AM ONCBMT LABS [facility]   09/10/2024 10:00 AM ONCBMT APP A [facility]   09/10/2024 11:00 AM Polly Alyce Dover, MD ONCMULTI [facility]   09/10/2024 12:00 PM ONCBMT PROCEDURES HONC3UCA [facility]   09/17/2024  9:15 AM ONCBMT LABS [facility]   09/17/2024 10:00 AM Patricia [name] Dossie Craze, MD [facility]ON[facility]   09/17/2024 11:00 AM CARSER EKG [id number] [facility]   09/24/2024  8:15 AM ONCBMT LABS [facility]ON[facility]   09/24/2024  9:00 AM ONCBMT APP A [facility]   09/24/2024 10:00 AM ONCBMT PHARMACY [facility]   09/24/2024 10:30 AM ONCBMT COORDINATOR [facility]ON[facility]   12/10/2024  9:30 AM  ONCBMT LABS [facility]   12/10/2024 10:30 AM ONCBMT PHARMACY [facility]   12/27/2024  9:40 AM Storm Honer, MD UNCDIABENDET [facility]

## 2024-07-18 NOTE — Progress Notes (Signed)
 Outpatient BMT SW Note: SW called pt to follow up on NMDP request from yesterday. She was not available at time of call.    SW left a VM requesting a return call.    SW will follow up.

## 2024-07-19 ENCOUNTER — Ambulatory Visit: Admit: 2024-07-19 | Discharge: 2024-07-19 | Payer: PRIVATE HEALTH INSURANCE

## 2024-07-19 ENCOUNTER — Other Ambulatory Visit: Admit: 2024-07-19 | Discharge: 2024-07-19 | Payer: PRIVATE HEALTH INSURANCE

## 2024-07-19 ENCOUNTER — Inpatient Hospital Stay: Admit: 2024-07-19 | Discharge: 2024-07-19 | Payer: PRIVATE HEALTH INSURANCE

## 2024-07-19 DIAGNOSIS — C9100 Acute lymphoblastic leukemia not having achieved remission: Principal | ICD-10-CM

## 2024-07-19 DIAGNOSIS — E876 Hypokalemia: Principal | ICD-10-CM

## 2024-07-19 DIAGNOSIS — B348 Other viral infections of unspecified site: Principal | ICD-10-CM

## 2024-07-19 DIAGNOSIS — Z5181 Encounter for therapeutic drug level monitoring: Principal | ICD-10-CM

## 2024-07-19 DIAGNOSIS — D84821 Immunosuppressed due to chemotherapy (HHS-HCC): Principal | ICD-10-CM

## 2024-07-19 DIAGNOSIS — Z9484 Stem cells transplant status: Principal | ICD-10-CM

## 2024-07-19 DIAGNOSIS — Z7969 Immunosuppressed due to chemotherapy (HHS-HCC): Principal | ICD-10-CM

## 2024-07-19 DIAGNOSIS — Z9189 Other specified personal risk factors, not elsewhere classified: Principal | ICD-10-CM

## 2024-07-19 DIAGNOSIS — E1165 Type 2 diabetes mellitus with hyperglycemia: Principal | ICD-10-CM

## 2024-07-19 DIAGNOSIS — Z9481 Bone marrow transplant status: Principal | ICD-10-CM

## 2024-07-19 DIAGNOSIS — Z794 Long term (current) use of insulin: Principal | ICD-10-CM

## 2024-07-19 DIAGNOSIS — D84822 Immunocompromised state associated with stem cell transplant    (CMS-HCC): Principal | ICD-10-CM

## 2024-07-19 DIAGNOSIS — R7989 Other specified abnormal findings of blood chemistry: Principal | ICD-10-CM

## 2024-07-19 LAB — CBC W/ AUTO DIFF
BASOPHILS ABSOLUTE COUNT: 0.1 10*9/L (ref 0.0–0.1)
BASOPHILS RELATIVE PERCENT: 1.3 %
EOSINOPHILS ABSOLUTE COUNT: 0.4 10*9/L (ref 0.0–0.5)
EOSINOPHILS RELATIVE PERCENT: 10.4 %
HEMATOCRIT: 35.2 % (ref 34.0–44.0)
HEMOGLOBIN: 11.6 g/dL (ref 11.3–14.9)
LYMPHOCYTES ABSOLUTE COUNT: 0.9 10*9/L — ABNORMAL LOW (ref 1.1–3.6)
LYMPHOCYTES RELATIVE PERCENT: 22.7 %
MEAN CORPUSCULAR HEMOGLOBIN CONC: 33.1 g/dL (ref 32.0–36.0)
MEAN CORPUSCULAR HEMOGLOBIN: 32 pg (ref 25.9–32.4)
MEAN CORPUSCULAR VOLUME: 96.8 fL — ABNORMAL HIGH (ref 77.6–95.7)
MEAN PLATELET VOLUME: 10.5 fL (ref 6.8–10.7)
MONOCYTES ABSOLUTE COUNT: 0.6 10*9/L (ref 0.3–0.8)
MONOCYTES RELATIVE PERCENT: 15.1 %
NEUTROPHILS ABSOLUTE COUNT: 2 10*9/L (ref 1.8–7.8)
NEUTROPHILS RELATIVE PERCENT: 50.5 %
PLATELET COUNT: 126 10*9/L — ABNORMAL LOW (ref 150–450)
RED BLOOD CELL COUNT: 3.63 10*12/L — ABNORMAL LOW (ref 3.95–5.13)
RED CELL DISTRIBUTION WIDTH: 17.1 % — ABNORMAL HIGH (ref 12.2–15.2)
WBC ADJUSTED: 4 10*9/L (ref 3.6–11.2)

## 2024-07-19 LAB — COMPREHENSIVE METABOLIC PANEL
ALBUMIN: 3.7 g/dL (ref 3.4–5.0)
ALKALINE PHOSPHATASE: 83 U/L (ref 46–116)
ALT (SGPT): 364 U/L — ABNORMAL HIGH (ref 10–49)
ANION GAP: 13 mmol/L (ref 5–14)
AST (SGOT): 256 U/L — ABNORMAL HIGH (ref <=34)
BILIRUBIN TOTAL: 0.2 mg/dL — ABNORMAL LOW (ref 0.3–1.2)
BLOOD UREA NITROGEN: 10 mg/dL (ref 9–23)
BUN / CREAT RATIO: 15
CALCIUM: 9.1 mg/dL (ref 8.7–10.4)
CHLORIDE: 105 mmol/L (ref 98–107)
CO2: 24 mmol/L (ref 20.0–31.0)
CREATININE: 0.65 mg/dL (ref 0.55–1.02)
EGFR CKD-EPI (2021) FEMALE: >90 mL/min/1.73m2 (ref >=60)
GLUCOSE RANDOM: 212 mg/dL — ABNORMAL HIGH (ref 70–179)
POTASSIUM: 4.2 mmol/L (ref 3.4–4.8)
PROTEIN TOTAL: 6.5 g/dL (ref 5.7–8.2)
SODIUM: 142 mmol/L (ref 135–145)

## 2024-07-19 LAB — URINALYSIS WITH MICROSCOPY
BILIRUBIN UA: NEGATIVE
GLUCOSE UA: NEGATIVE
HYALINE CASTS: 11 /LPF — ABNORMAL HIGH (ref 0–1)
KETONES UA: NEGATIVE
NITRITE UA: NEGATIVE
PH UA: 6 (ref 5.0–9.0)
PROTEIN UA: 50 — AB
RBC UA: 12 /HPF — ABNORMAL HIGH (ref <=4)
SPECIFIC GRAVITY UA: 1.023 (ref 1.003–1.030)
SQUAMOUS EPITHELIAL: 1 /HPF (ref 0–5)
UROBILINOGEN UA: <2
WBC UA: 11 /HPF — ABNORMAL HIGH (ref 0–5)

## 2024-07-19 LAB — MAGNESIUM: MAGNESIUM: 1.4 mg/dL — ABNORMAL LOW (ref 1.6–2.6)

## 2024-07-19 LAB — HCG QUANTITATIVE, BLOOD: GONADOTROPIN, CHORIONIC (HCG) QUANT: 5.9 m[IU]/mL

## 2024-07-19 LAB — PREGNANCY, URINE
PREGNANCY TEST URINE: POSITIVE — AB
PREGNANCY TEST URINE: UNDETERMINED — AB

## 2024-07-19 MED ADMIN — STUDY PUL-042-207 Pam2CSK4 / ODN M362 or placebo inhalation solution 4 mL: 4 mL | RESPIRATORY_TRACT | @ 22:00:00 | Stop: 2024-07-19

## 2024-07-19 NOTE — Progress Notes (Signed)
 Pt arrived to chair 4 for study breathing tx. No new complaints noted.     Tx completed and pt tolerated w/o s/s of reaction.     30 min obs completed from 1705-1735.     AVS declined and pt discharged to home stable and ambulatory.

## 2024-07-19 NOTE — Telephone Encounter (Signed)
 2nd attempt: Gery Francis paged Addie, [place] MRN [id number] Cell [phone] DOB 1976-07-19 temp 100.6 cough and congestion not new. No N/V/D. Please advise patient. CANDIE Juba [phone]

## 2024-07-19 NOTE — Progress Notes (Signed)
 Double height and weight verified with Ronal NOVAK RN .  Height: 168 cm  Weight: 74.2 kgs

## 2024-07-19 NOTE — Patient Instructions (Signed)
 RED ZONE Means: RED ZONE: Take action now!     You need to be seen right away  Symptoms are at a severe level of discomfort    Call 911 or go to your nearest  Hospital for help     - Bleeding that will not stop    - Hard to breathe    - New seizure - Chest pain  - Fall or passing out  -Thoughts of hurting    yourself or others      Call 911 if you are going into the RED ZONE                  YELLOW ZONE Means:     Please call with any new or worsening symptom(s), even if not on this list.  Call [phone]  After hours, weekends, and holidays - you will reach a long recording with specific instructions, If not in an emergency such as above, please listen closely all the way to the end and choose the option that relates to your need.   You can be seen by a provider the same day through our Same Day Acute Care for Patients with Cancer program.      YELLOW ZONE: Take action today     Symptoms are new or worsening  You are not within your goal range for:    - Pain    - Shortness of breath    - Bleeding (nose, urine, stool, wound)    - Feeling sick to your stomach and throwing up    - Mouth sores/pain in your mouth or throat    - Hard stool or very loose stools (increase in       ostomy output)    - No urine for 12 hours    - Feeding tube or other catheter/tube issue    - Redness or pain at previous IV or port/catheter site    - Depressed or anxiety   - Swelling (leg, arm, abdomen,     face, neck)  - Skin rash or skin changes  - Wound issues (redness, drainage,    re-opened)  - Confusion  - Vision changes  - Fever >100.4 F or chills  - Worsening cough with mucus that is    green, yellow, or bloody  - Pain or burning when going to the    bathroom  - Home Infusion Pump Issue- call    [phone]         Call your healthcare provider if you are going into the YELLOW ZONE     GREEN ZONE Means:  Your symptoms are under controls  Continue to take your medicine as ordered  Keep all visits to the provider GREEN ZONE: You are in control  No increase or worsening symptoms  Able to take your medicine  Able to drink and eat    - DO NOT use MyChart messages to report red or yellow symptoms. Allow up to 3    business days for a reply.  -MyChart is for non-urgent medication refills, scheduling requests, or other general questions.         [id number] Rev. 01/28/2022  Approved by Oncology Patient Education Committee     Office Visit on 07/19/2024   Component Date Value Ref Range Status    Pregnancy Test, Urine 07/19/2024 Indeterminate (A)  Negative Final    Result Indeterminate. Suggest serum HCG quantitation or repeat urine in 48 hours    hCG Quantitative 07/19/2024 5.9  mIU/mL Final   Lab on 07/19/2024   Component Date Value Ref Range Status    Sodium 07/19/2024 142  135 - 145 mmol/L Final    Potassium 07/19/2024 4.2  3.4 - 4.8 mmol/L Final    Chloride 07/19/2024 105  98 - 107 mmol/L Final    CO2 07/19/2024 24.0  20.0 - 31.0 mmol/L Final    Anion Gap 07/19/2024 13  5 - 14 mmol/L Final    BUN 07/19/2024 10  9 - 23 mg/dL Final    Creatinine 87/80/7974 0.65  0.55 - 1.02 mg/dL Final    BUN/Creatinine Ratio 07/19/2024 15   Final    eGFR CKD-EPI (2021) Female 07/19/2024 >90  >=60 mL/min/1.32m2 Final    eGFR calculated with CKD-EPI 2021 equation in accordance with [employer] and [employer] of Nephrology Task Force recommendations.    Glucose 07/19/2024 212 (H)  70 - 179 mg/dL Final    Calcium  07/19/2024 9.1  8.7 - 10.4 mg/dL Final    Albumin 87/80/7974 3.7  3.4 - 5.0 g/dL Final    Total Protein 07/19/2024 6.5  5.7 - 8.2 g/dL Final    Total Bilirubin 07/19/2024 0.2 (L)  0.3 - 1.2 mg/dL Final    AST 87/80/7974 256 (H)  <=34 U/L Final    ALT 07/19/2024 364 (H)  10 - 49 U/L Final    Alkaline Phosphatase 07/19/2024 83  46 - 116 U/L Final    Magnesium  07/19/2024 1.4 (L)  1.6 - 2.6 mg/dL Final    Color, UA 87/80/7974 Yellow   Final    Clarity, UA 07/19/2024 Clear   Final    Specific Gravity, UA 07/19/2024 1.023  1.003 - 1.030 Final pH, UA 07/19/2024 6.0  5.0 - 9.0 Final    Leukocyte Esterase, UA 07/19/2024 Trace (A)  Negative Final    Nitrite, UA 07/19/2024 Negative  Negative Final    Protein, UA 07/19/2024 50 mg/dL (A)  Negative Final    Glucose, UA 07/19/2024 Negative  Negative Final    Ketones, UA 07/19/2024 Negative  Negative Final    Urobilinogen, UA 07/19/2024 <2.0 mg/dL  <7.9 mg/dL Final    Bilirubin, UA 07/19/2024 Negative  Negative Final    Blood, UA 07/19/2024 Trace (A)  Negative Final    RBC, UA 07/19/2024 12 (H)  <=4 /HPF Final    WBC, UA 07/19/2024 11 (H)  0 - 5 /HPF Final    Squam Epithel, UA 07/19/2024 1  0 - 5 /HPF Final    Bacteria, UA 07/19/2024 Few (A)  None Seen /HPF Final    Hyaline Casts, UA 07/19/2024 11 (H)  0 - 1 /LPF Final    Ca Oxalate Crystal, UA 07/19/2024 Occasional  /HPF Final    Mucus, UA 07/19/2024 Few (A)  None Seen /HPF Final    Pregnancy Test, Urine 07/19/2024 Positive (A)  Negative Final    WBC 07/19/2024 4.0  3.6 - 11.2 10*9/L Final    RBC 07/19/2024 3.63 (L)  3.95 - 5.13 10*12/L Final    HGB 07/19/2024 11.6  11.3 - 14.9 g/dL Final    HCT 87/80/7974 35.2  34.0 - 44.0 % Final    MCV 07/19/2024 96.8 (H)  77.6 - 95.7 fL Final    MCH 07/19/2024 32.0  25.9 - 32.4 pg Final    MCHC 07/19/2024 33.1  32.0 - 36.0 g/dL Final    RDW 87/80/7974 17.1 (H)  12.2 - 15.2 % Final    MPV 07/19/2024 10.5  6.8 - 10.7 fL Final    Platelet 07/19/2024 126 (L)  150 - 450 10*9/L Final    Neutrophils % 07/19/2024 50.5  % Final    Lymphocytes % 07/19/2024 22.7  % Final    Monocytes % 07/19/2024 15.1  % Final    Eosinophils % 07/19/2024 10.4  % Final    Basophils % 07/19/2024 1.3  % Final    Absolute Neutrophils 07/19/2024 2.0  1.8 - 7.8 10*9/L Final    Absolute Lymphocytes 07/19/2024 0.9 (L)  1.1 - 3.6 10*9/L Final    Absolute Monocytes 07/19/2024 0.6  0.3 - 0.8 10*9/L Final    Absolute Eosinophils 07/19/2024 0.4  0.0 - 0.5 10*9/L Final    Absolute Basophils 07/19/2024 0.1  0.0 - 0.1 10*9/L Final    Anisocytosis 07/19/2024 Slight (A) Not Present Final

## 2024-07-19 NOTE — Telephone Encounter (Signed)
 Telephone Encounter    Patient contacted the [facility] nursing line with a complaint of new fever to 100.6 F and accompanying chills today starting 1-2 hours after receiving the inhaled placebo vs study drug.  No chest pain, nausea, vomiting, diarrhea.  She also reported 1 week of hoarseness, dry cough, nasal congestion, and maxillary and frontal sinus pain.  She tested positive for parainfluenza on 12/16.  Of note, her mother has had similar symptoms.    Patient was reported to have URI symptoms during her clinic visit today.  However, fever and chills are new.  Patient said that she does not feel ill enough to present to the ED, and is agreeable to receiving antibiotics for suspected superimposed bacterial sinusitis.  She inquired about the safety of taking antibiotics while on the study.  Reassured patient that it was safe to take Augmentin , which she has tolerated well in the past.  Encouraged her to present to the ED if she has new or worsening symptoms (e.g., worsening fever, weakness, fatigue, nausea not improved with home PRN zofran ).  Patient and mother expressed understanding.    Patient and plan were discussed with Dr. Debarah.    Donovan Francis MD, PhD  Hematology/Oncology Fellow

## 2024-07-19 NOTE — Telephone Encounter (Signed)
 [facility] paged Chrystina, Naff MRN [id number] Cell [phone] DOB May 05, 1976 temp 100.6 cough and congestion not new. No N/V/D. Please advise patient. CANDIE Juba [phone]

## 2024-07-19 NOTE — Progress Notes (Signed)
 Bone Marrow Transplant Research Note:  Date: 07/19/24     Clinical Study:  PUL-042-207 - A Phase 2 Multiple Dose Study to Evaluate the Efficacy and Safety of PUL-042 Inhalation Solution in Reducing Lower Respiratory Tract Complications in Patients with Hematologic Malignancies and Recipients of Hematopoietic Stem Cell Transplantation (HSCT) with Documented Viral Infections with Parainfluenza Virus (PIV), Human Metapneumovirus (hMPV) or Respiratory Syncytial Virus (RSV)     Study Visit:     Today [name] was enrolled on PUL-042-207 and completed study tasks. Dr. Deward Church confirmed study eligibility. Nasopharyngeal swabs were collected. Patient was randomized and treated. Study coordinator will continue to follow up with the patient. The next dose is due on 07/22/24.

## 2024-07-20 DIAGNOSIS — C9100 Acute lymphoblastic leukemia not having achieved remission: Principal | ICD-10-CM

## 2024-07-20 LAB — TACROLIMUS LEVEL: TACROLIMUS BLOOD: 5.2 ng/mL

## 2024-07-20 MED ORDER — AMOXICILLIN 875 MG-POTASSIUM CLAVULANATE 125 MG TABLET
ORAL_TABLET | Freq: Two times a day (BID) | ORAL | 0 refills | 7.00000 days | Status: CP
Start: 2024-07-20 — End: 2024-07-27

## 2024-07-20 NOTE — Progress Notes (Signed)
 Bone Marrow Transplant Research Note:  Date: 07/20/24     Clinical Study:  [id number] - A Phase 2 Multiple Dose Study to Evaluate the Efficacy and Safety of PUL-042 Inhalation Solution in Reducing Lower Respiratory Tract Complications in Patients with Hematologic Malignancies and Recipients of Hematopoietic Stem Cell Transplantation (HSCT) with Documented Viral Infections with Parainfluenza Virus (PIV), Human Metapneumovirus (hMPV) or Respiratory Syncytial Virus (RSV)      Study Visit:      Today the study coordinator called Patricia [name] for follow-up after her first dose. She let me know that she had a fever and cough after her first dose on 12/19. She also stated that she vomited in the evening. Today, Ms. Bruss stated her symptoms were not worse than her baseline yesterday. She also stated that she was afebrile at the time of our phone call. I discussed her status with Dr. Debarah. The next dose is due on 07/22/24. Her symptom score for 12/20 is the following:    -Cough: moderate (2)  -Shortness of breath: mild (1)  -Sputum production: moderate (2)  -Chest pain: absent (0)  Total: 5

## 2024-07-20 NOTE — Progress Notes (Signed)
 [facility]  Immunosuppressive Therapy Note    Patricia [name] is a 48 y.o. 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 [name] is currently day +38.    Concurrent CYP3A4 inhibitors:  - Letermovir   - Fluconazole  on hold starting 12/13 due to elevated LFTs     Current dose: Held x1 dose (12/13 PM), then resumed reduced dose of 0.5 mg qAM + 1 mg qPM starting 12/14 AM     Goal tacrolimus  Level: 5-10 ng/mL    Resulted level: 5.2 ng/mL drawn on 12/19 at 12:56 PM, resulted today (12/20) at 11:17 AM    Lab Results   Component Value Date/Time    TACROLIMUS  5.2 07/19/2024 12:56 PM    TACROLIMUS  6.8 07/16/2024 12:56 PM    TACROLIMUS  9.2 07/12/2024 02:24 PM    TACROLIMUS  10.1 06/27/2024 09:37 AM    TACROLIMUS  7.7 06/26/2024 08:21 AM    TACROLIMUS  9.7 06/24/2024 08:43 AM       Lab Results   Component Value Date/Time    CREATININE 0.65 07/19/2024 12:56 PM    CREATININE 0.74 07/16/2024 12:56 PM    CREATININE 0.78 07/12/2024 02:24 PM      A/P  - Give early afternoon lab draw, tacrolimus  trough is likely higher yet within therapeutic range  - SCr WNL and overall stable   - T bili WNL; LFTs have previously been elevated but stable, however, now AST increased to 256 and ALT increased to 364 (could be in setting of parainfluenza URI)  - Fluconazole  was held starting 12/13 given LFT elevations; as this washes out, may also be contributing to downtrend in level. Depending on the trend of LFTs at next visit, may re-start fluconazole  or transition to micafungin  - Given the above, plan to continue tacrolimus  0.5 mg qAM + 1 mg qPM and plan to recheck level and CMP at next clinic visit    We will continue to monitor levels.  Patient will be followed for changes in renal and hepatic function, toxicity, and efficacy.     Hubert Mate, PharmD  PGY2 Oncology Pharmacy Resident

## 2024-07-21 NOTE — Progress Notes (Signed)
 BMT Clinic Follow-up    [name]  MRN: [id number]  Encounter date: 07/22/2024    Referring Physician: Estelle Toribio SAUNDERS, MD  BMT Attending MD: Dr. Jeaneen    Transplant Day: 22     Reason for Visit: Post allogeneic transplant follow up. This visit included intensive monitoring of high-risk medications/disease states given risk of severe myelosuppression, immunosuppression, infection, and other toxicities. Review included CBC, electrolytes and other blood tests, as well as direct assessment of the patient for symptoms suggesting toxicity.    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 Her transplant course was complicated by T2DM with steroid induced hyperglycemia (stable on current regimen at discharge), prolonged Qtc on admission (resolved with last Qtc 460 from 11/20), non-infectious diarrhea (resolved without recent need for imodium ). 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 Received most recent dose of IVIG on 11/26.     Interval History  Patricia [name] is a 48 y.o. 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 Patricia [name] is now s/p a matched unrelated donor stem cell transplant      - Feels like she had a reaction after her nebulizer treatment on Friday. Developed a temp that night that continued to increase and peaked at 101. She did take a dose of tylenol  x1 with no improvement in fever that night but had resolved by Saturday morning. She also developed vomiting Friday night and had 3-4 episodes that evening and 1 episode of vomiting on Satruday and 1 episode of vomiting on Sunday. No nausea or vomiting so far today. No recurrent fever since Friday evening.   - Feels like her nasal congestion/ear congestion continued to worsen over the weekend and she is now using Afrin with improved relief in nasal congestion/able to sleep at night as she can breath through her nose.   - Still with on going cough, not as productive. Now 50/50 dry/productive but feels she is using albuterol  more consistently as this seems to be the only thing to help her cough. She is using albuterol  2-3 times per day. No SOB. Has had occasional expiratory wheezing. No wheezing on exam.   - PND improved some since starting Afrin.   - She did get Augmentin  prescribed over the weekend when she called in with fever and worsening symptoms but was not able to pick it up as her [facility] claimed to not have the script. Will send to outpatient pharmacy today.   - Has not been eating/drinking as well over the weekend due to increased fatigue and on going nausea/vomiting. Eating/drinking improved today now that nausea/vomiting has resolved and she is not fatigued.   - Denies any diarrhea but has had some cramping with BM. Going 1-2 times per day.   - Denies any rashes.   - BS have been upper 100s to low 200s.   - Does note missing her morning meds Saturday and Sunday due to sleeping/not feeling well and on going n/v.   - Given the fever and nausea/vomiting Friday after the nebulizer treatment she has declined further dosing and would like to withdraw from the study, did discuss possible pre-meds to try and prevent further fever or N/V but she still declines even with option for pre-meds.     ID: Valtrex , Fluconazole  (Hold beginning 12/12 with elevated LFTs, restarting 12/22 with improving LFTS (they were likely up secondary to URI), Dapsone  (Held 12/9 due to elevated LFTs, if LFTs stable/continue to improve  after restarting fluconazole  today will plan to restart Dapsone  next visit, if not plan for Pentamidine next week once URI symptoms improve).     Tacrolimus : Current dose 0.5mg  qAM and 1mg  qPM    Physical Exam:  BP 100/73  - Pulse 102  - Temp 36.3 ??C (97.3 ??F) (Temporal)  - Resp 16  - Ht 167.6 cm (5' 6)  - Wt 75.1 kg (165 lb 9.1 oz)  - SpO2 98%  - BMI 26.72 kg/m??     70, Cares for self; unable to carry on normal activity or to do active work (ECOG equivalent 1)    General: No acute distress noted.   Central venous access: Line clean, dry, intact. No erythema or drainage noted.   ENT: Moist mucous membranes. Oropharhynx without lesions, erythema or exudate.   Cardiovascular: Pulse normal rate, regularity and rhythm. S1 and S2 normal, without any murmur, rub, or gallop.  Lungs: Clear to auscultation bilaterally, without wheezes/crackles/rhonchi. Good air movement.   Skin: Warm, dry, intact. No rash noted.    Psychiatry: Alert and oriented to person, place, and time.   Gastrointestinal/Abdomen: Normoactive bowel sounds, abdomen soft, non-tender   Musculoskeletal/Extremities: FROM throughout. No edema  Neurologic: CNII-XII intact. Normal strength and sensation throughout    Acute Graft versus Host Disease Assessment July 22, 2024  Organ stage:    Skin stage: 0 no active (erythematous) rash  Upper GI stage 0 No or intermittent nausea, vomiting or anorexia   Lower GI stage 0 No Diarrhea or <500 mL/day or < 3 episodes per day;   Hepatic stage 0 Bilirubin normal - 2mg /dL   *AST/ALT elevated but likely secondary to pararinfluenza URI (now starting to improve)     Overall grade (Current) Grade 0: No stage 1-4 of any organ  Max Grade of acute GVHD post-HCT: Grade 0: No stage 1-4 of any organ    Assessment/Plan:  1) SCT Summary:  - Type of Transplant: RIC MUD Allo (Fludarabine  40 mg/m2/Melphalan  100  mg/m2)  - Graft Source: Fresh PBSCs10/10, Female, ABO O-, CMV negative     2) Disease: ALL  - Pre BMT disease status: CR MRD Negative  - Signs/symptoms of disease relapse: None   - Restaging plan:    *Next BMBX: Day +90   *Next Chimerism: Day +60    Date Source Unfractionated CD3   07/12/24  PB >95% 93               3) Immunosuppressed: High risk of infections, if patient develops a fever will admit to inpatient unit for IV antibiotics and fever workup   - Prophylaxis:          * Antiviral: Valtrex  500 mg po BID through 2 years post transplant.   Letermovir  480 mg po daily through day +200 to prevent CMV infections   * Antifungal: Fluconazole  400 mg po daily through day +75 (Hold beginning 12/12 with LFT elevations)   *12/19: LFTs worsened likely in setting of Parainfluenza URI so fluc remained on hold   *12/22: LFTs now starting to improve with hopefully improving URI. Will rechallenge with Fluconazole , if LFTs worsen again will need to setup IV micafungin.   * PJP: Sulfa allergic. Dapsone  daily upon platelet engraftment, has tolerated this well in the past. Toxo PCR negative. Start upon platelet engraftment.    *12/5: Hgb improved, will start Dapsone  100mg  daily.   *12/9 Dapsone  held due to increasing LFTs will scheduled for Pentamidine 12/19 but delayed d/t URI  *  12/22: Restarting fluc as above, if LFTs stable/improved next visit ok to re-challenge with Dapsone  as well. If LFTs worsen again will need to schedule pentamidine once URI symptoms improve.     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   Previously required Valcyte  treatment. 11/16 CMV viral load undetectable.  - Continue Letermovir  and Valtrex  BID through day +200 post SCT.     Recurrent Acute Sinusitis  Recurrent acute sinusitis with nasal congestion, frontal headache, and low-grade fever. Responded well to Augmentin .  -11/7 current sx resolving, started Augmentin  10 days (10/30-11/9)    Parainfluenza:  -12/16: congestion, cough, sinus pressure. RPP +parainfluenza. Consented to pulmotect study.   - 12/19: 1st nebulizer treatment on Pumotect study, plan for 2nd treatment 12/22 and 3rd treatment 12/23.   - 12/22: Developed fever and N/V after 1st nebulizer. Per patient intolerance to nebulizer she has declined further doses and would like to withdraw from the study. Given increased albuterol  used CXR obtained which was clear. Continue with symptomatic treatment. Starting Agumentin x 10 days for possible secondary sinusitis. Sent screening cultures today due to fever of 101 over the weekend (likely secondary to Paraflu but will rule out bacteremia).     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   Prophylaxis:  - Methotrexate  5 mg/m2 IVP on days +1, +3, +6 and +11  - Tacrolimus      * Goal trough: 5-10     * High risk drug monitoring and dose adjustments per BMT Pharmacist    * Signs/symptoms of toxicity: None      5) Heme  Transfusion criteria: Transfuse 1 unit of PRBCs for hemoglobin <7 and 1 unit of platelets for Plt <10K or bleeding. Patricia [name] does not have a history of transfusion reactions. Consent was obtained and documented on 06/05/24.     Anemia: Now normalized.     Thrombocytopenia: Secondary to chemotherapy. Slowly improving.     Leukopenia: WBC/ANC stable.     6) GI/Liver:   GERD prophylaxis: Pepcid  BID    Nausea:PRN zofran  and compazine     Diarrhea: PRN Imodium , has not needed recently     Elevated liver enzymes:  - Started trending up on 12/5 with mildly elevated AST  - On 12/9 both AST and ALT elevated- Dapsone  and Pepcid  held  - 12/12: AST 112 and ALT 172 normal bili. Will hold Fluconazole  until next visit to re-evaluate. Sending adeno and HHV-6. CMV and EBV have been negative.   - 12/16: AST improved, ALT stable. Like up from URI (tested positive for parainfluenza today). Possibly secondary medication (metformin , fluc, dapsone , and pepcid ). Fluc, dapsone , and pepcid  on hold. If LFts stable/improved next visit would restart fluconazole .   - 12/19: AST/ALT worsened today, likely secondary to parainfluenza. Repeat labs on Monday if LFTs stable/imrpoved plan to re-challenge fluc, if worsen will need to setup IV micafungin.   - 12/22: AST/ALT improving thus likely secondary to URI. Will rechallenge with fluconazole .      7) Hypomagnesemia:   - Mg is 1.4, stable on current dose 2 tabs TID  Trying to be cautious with oral magnesium  increase given it upsets her stomach. BM now normalized so if Mg drops again could increase oral  magnesium  PRN. She does not tolerate IV magnesium  well (nausea)     8)  Heart failure with mildly reduced ejection fraction (HFmrEF)   Followed by [facility]:  Due to anythracycline use  Hx of EF 45-50%, most recent Echo 05/23/24 improved to 55-60%  Hx of prolonged QTc  - Home med Losartan  50 mg daily (held 11/18)      BP Readings from Last 3 Encounters:   07/22/24 100/73   07/19/24 120/84   07/19/24 133/95      9) Pulm/Allergies   - cetirizine  10 mg daily   - albuterol  PRN     10) Menses suppression: Lupron  last given 07/26/22. No menses sense (peri-menopausal/menopausal)  - 12/19: screening urine pregnancy test per study resulted as positive, repeat urine test indeterminate. Serum beta HcG 5.9. Low level Hcg can often be seen in peri-menopausal women who are not pregnant. She has had chemotherapy and only 1 month post transplant so would consider this a negative pregnancy screen.      11) Type 2 DM exacerbated by steroids: Followed by [facility]. At home uses Dexcom for monitoring. Has follow up with Endocrinology on 07/05/24.   - Increase insulin  glargine from 12u to 15u once daily  - Continue insulin  aspart with meals per carb counting (1 unit for every 8 grams of carbs)  - Endocrine added Metformin  500 mg XR daily on 07/05/24. Next follow up 08/28/23    12) Pain:  - PRN tramadol  50 mg. No oxy as this give patient migraines     13) Anxiety:  Lexapro  10 mg daily (has PRN ativan  available but has not needed)  Reconnected with her local therapist      14) DispoCaregivers: Lives in [place], [place]  Primary caregiver: Jeanie Tate (mom)- first 30 days at [facility] then home if doing well will stay longer if medically needed  Secondary caregiver: Amberia Bayless (husband)    15) Bone Health: DEXA 07/09/24: Results with normal bone density    Plan:  - Parainfluenza: Received 1st dose nebulizer 12/19 but then developed fever and N/V. Due to intolerance of nebulizer patient declines further dosing and would like to withdraw from the study. Plan for UA and urine pregnancy screen next visit per study withdrawal protocol.   - Start Augmentin  x 10 days for possible secondary bacterial sinusitis   - LFTs improving. Rechallenge with fluconazole  today. If LFTs continue to improve will also add back Dapsone  next visit.   - CVL removal pushed to 12/23 with current parainfluenza infection (has port)   - RTC Wednesday.     I personally spent 64 minutes face-to-face and non-face-to-face in the care of this patient, which includes all pre, intra, and post visit time on the date of service.  All documented time was specific to the E/M visit and does not include any procedures that may have been performed.  Time was spent on clinical assessment, placing orders / referrals, and/or prescription management, all pertaining to the diagnoses listed above.       Duwaine Abrahams, PA  [facility]      Future Appointments   Date Time Provider Department Center   07/23/2024 10:00 AM NELS IR RM 3B [id number] [place] [facility]   07/23/2024 11:30 AM ONCINF CHAIR 08 [id number] [facility]   07/24/2024 11:00 AM ONCBMT LABS [facility]   07/24/2024 12:00 PM ONCBMT APP A [facility]   07/30/2024 12:15 PM ONCBMT LABS [facility]  [facility]   07/30/2024  1:00 PM ONCBMT APP A [facility]   08/02/2024 12:00 PM ONCBMT LABS [facility]   08/02/2024  1:00 PM ONCBMT APP B [facility]   08/06/2024 12:00 PM ONCBMT LABS [facility]   08/06/2024  1:00 PM ONCBMT APP A [facility]   08/08/2024  7:30 AM ONCINF CHAIR 18 [id number]   08/09/2024 12:00 PM ONCBMT LABS [facility]ON[facility]   08/09/2024  1:00 PM ONCBMT APP A [facility]   08/13/2024  9:30 AM ONCBMT LABS [facility]   08/13/2024 10:30 AM [name], MD HONCBMT [facility]   08/13/2024 1:25 PM Delores Fleeting, MD UNCHRTVASET [facility]   08/27/2024  9:50 AM Kome, Arlean HERO, CPP UNCDIABENDET [facility]   09/05/2024 10:00 AM Jarold Motto T UNCDIABENDET [facility]   09/10/2024  9:00 AM ONCBMT LABS HON[facility]MT [facility]   09/10/2024 10:00 AM ONCBMT APP A [facility]   09/10/2024 11:00 AM Polly Alyce Dover, MD ONCMULTI [facility]   09/10/2024 12:00 PM ONCBMT PROCEDURES [id number] [facility]   09/17/2024  9:15 AM ONCBMT LABS HONCBMT [facility]   09/17/2024 10:00 AM [name], MD HONCBMT [facility]   09/17/2024 11:00 AM CARSER EKG [id number] [facility]   09/24/2024  8:15 AM ONCBMT LABS HONCBMT [facility]   09/24/2024  9:00 AM ONCBMT APP A [facility]ON[facility]   09/24/2024 10:00 AM ONCBMT PHARMACY HON[facility]   09/24/2024 10:30 AM ONCBMT COORDINATOR HONCBMT [facility]   12/10/2024  9:30 AM ONCBMT LABS HON[facility]MT [facility]   12/10/2024 10:30 AM ONCBMT PHARMACY HONCBMT [facility]   12/27/2024  9:40 AM Storm Honer, MD UNCDIABENDET [facility]

## 2024-07-22 DIAGNOSIS — R509 Fever, unspecified: Principal | ICD-10-CM

## 2024-07-22 DIAGNOSIS — Z9484 Stem cells transplant status: Principal | ICD-10-CM

## 2024-07-22 DIAGNOSIS — C9100 Acute lymphoblastic leukemia not having achieved remission: Principal | ICD-10-CM

## 2024-07-22 DIAGNOSIS — Z9481 Bone marrow transplant status: Principal | ICD-10-CM

## 2024-07-22 DIAGNOSIS — Z794 Long term (current) use of insulin: Principal | ICD-10-CM

## 2024-07-22 DIAGNOSIS — D84822 Immunocompromised state associated with stem cell transplant    (CMS-HCC): Principal | ICD-10-CM

## 2024-07-22 DIAGNOSIS — Z5181 Encounter for therapeutic drug level monitoring: Principal | ICD-10-CM

## 2024-07-22 DIAGNOSIS — E1165 Type 2 diabetes mellitus with hyperglycemia: Principal | ICD-10-CM

## 2024-07-22 DIAGNOSIS — R7989 Other specified abnormal findings of blood chemistry: Principal | ICD-10-CM

## 2024-07-22 DIAGNOSIS — B9689 Other specified bacterial agents as the cause of diseases classified elsewhere: Principal | ICD-10-CM

## 2024-07-22 DIAGNOSIS — R059 Cough, unspecified type: Principal | ICD-10-CM

## 2024-07-22 DIAGNOSIS — J019 Acute sinusitis, unspecified: Principal | ICD-10-CM

## 2024-07-22 LAB — COMPREHENSIVE METABOLIC PANEL
ALBUMIN: 3.9 g/dL (ref 3.4–5.0)
ALKALINE PHOSPHATASE: 81 U/L (ref 46–116)
ALT (SGPT): 315 U/L — ABNORMAL HIGH (ref 10–49)
ANION GAP: 13 mmol/L (ref 5–14)
AST (SGOT): 140 U/L — ABNORMAL HIGH (ref <=34)
BILIRUBIN TOTAL: 0.3 mg/dL (ref 0.3–1.2)
BLOOD UREA NITROGEN: 10 mg/dL (ref 9–23)
BUN / CREAT RATIO: 15
CALCIUM: 9.6 mg/dL (ref 8.7–10.4)
CHLORIDE: 104 mmol/L (ref 98–107)
CO2: 26 mmol/L (ref 20.0–31.0)
CREATININE: 0.67 mg/dL (ref 0.55–1.02)
EGFR CKD-EPI (2021) FEMALE: >90 mL/min/1.73m2 (ref >=60)
GLUCOSE RANDOM: 283 mg/dL — ABNORMAL HIGH (ref 70–179)
POTASSIUM: 3.4 mmol/L (ref 3.4–4.8)
PROTEIN TOTAL: 7 g/dL (ref 5.7–8.2)
SODIUM: 143 mmol/L (ref 135–145)

## 2024-07-22 LAB — CBC W/ AUTO DIFF
BASOPHILS ABSOLUTE COUNT: 0 10*9/L (ref 0.0–0.1)
BASOPHILS RELATIVE PERCENT: 0.6 %
EOSINOPHILS ABSOLUTE COUNT: 0.6 10*9/L — ABNORMAL HIGH (ref 0.0–0.5)
EOSINOPHILS RELATIVE PERCENT: 12.4 %
HEMATOCRIT: 36.1 % (ref 34.0–44.0)
HEMOGLOBIN: 12.2 g/dL (ref 11.3–14.9)
LYMPHOCYTES ABSOLUTE COUNT: 1.2 10*9/L (ref 1.1–3.6)
LYMPHOCYTES RELATIVE PERCENT: 24 %
MEAN CORPUSCULAR HEMOGLOBIN CONC: 33.8 g/dL (ref 32.0–36.0)
MEAN CORPUSCULAR HEMOGLOBIN: 32.4 pg (ref 25.9–32.4)
MEAN CORPUSCULAR VOLUME: 96 fL — ABNORMAL HIGH (ref 77.6–95.7)
MEAN PLATELET VOLUME: 10.6 fL (ref 6.8–10.7)
MONOCYTES ABSOLUTE COUNT: 0.6 10*9/L (ref 0.3–0.8)
MONOCYTES RELATIVE PERCENT: 11.7 %
NEUTROPHILS ABSOLUTE COUNT: 2.6 10*9/L (ref 1.8–7.8)
NEUTROPHILS RELATIVE PERCENT: 51.3 %
PLATELET COUNT: 147 10*9/L — ABNORMAL LOW (ref 150–450)
RED BLOOD CELL COUNT: 3.76 10*12/L — ABNORMAL LOW (ref 3.95–5.13)
RED CELL DISTRIBUTION WIDTH: 16.5 % — ABNORMAL HIGH (ref 12.2–15.2)
WBC ADJUSTED: 5 10*9/L (ref 3.6–11.2)

## 2024-07-22 LAB — MAGNESIUM: MAGNESIUM: 1.6 mg/dL (ref 1.6–2.6)

## 2024-07-22 LAB — IGG: GAMMAGLOBULIN; IGG: 697 mg/dL (ref 650–1600)

## 2024-07-22 LAB — TACROLIMUS LEVEL: TACROLIMUS BLOOD: 4.4 ng/mL

## 2024-07-22 MED ORDER — AMOXICILLIN 875 MG-POTASSIUM CLAVULANATE 125 MG TABLET
ORAL_TABLET | Freq: Two times a day (BID) | ORAL | 0 refills | 10.00000 days | Status: CP
Start: 2024-07-22 — End: 2024-08-01
  Filled 2024-07-22: qty 20, 10d supply, fill #0

## 2024-07-22 NOTE — Progress Notes (Signed)
 BMT Clinic Follow-up    [name]  MRN: [id number]  Encounter date: 07/24/2024    Referring Physician: Estelle Toribio SAUNDERS, MD  BMT Attending MD: Dr. Jeaneen    Transplant Day: 60     Reason for Visit: Post allogeneic transplant follow up. This visit included intensive monitoring of high-risk medications/disease states given risk of severe myelosuppression, immunosuppression, infection, and other toxicities. Review included CBC, electrolytes and other blood tests, as well as direct assessment of the patient for symptoms suggesting toxicity.    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 Her transplant course was complicated by T2DM with steroid induced hyperglycemia (stable on current regimen at discharge), prolonged Qtc on admission (resolved with last Qtc 460 from 11/20), non-infectious diarrhea (resolved without recent need for imodium ). 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 Received most recent dose of IVIG on 11/26.     Interval History  Patricia [name] is a 48 y.o. 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 Patricia [name] is now s/p a matched unrelated donor stem cell transplant      Last week Patricia [name] was diagnosed with parainfluenza and enrolled on PUL-042.  She received received her first nebulizer treatment on 07/19/2024 and overnight developed fever and vomiting.  She withdrew from the trial on Monday, 07/22/2024 when she was seen in clinic.    Today in clinic Patricia [name] states that her cough has worsened over the past day.  She describes the cough as being somewhat productive of scant sputum but feels like it is in my chest.  She denies any recurrent fever, dyspnea on exertion, or shortness of breath.  She has used an albuterol  nebulizer intermittently with some benefit.  Otherwise she has no complaints.  She denies any new dry eye, dry mouth, skin rash, recent nausea and vomiting, or diarrhea.    ID: Valtrex , Fluconazole  (Hold beginning 12/12 with elevated LFTs, restarting 12/22 with improving LFTS (they were likely up secondary to URI), Dapsone  (Held 12/9 due to elevated LFTs, if LFTs stable/continue to improve after restarting fluconazole  today will plan to restart Dapsone  next visit, if not plan for Pentamidine next week once URI symptoms improve).     Tacrolimus : Current dose 0.5 mg qAM and 0.5 mg qPM (changed on 07/24/2024)    Physical Exam:  BP 122/84  - Pulse 99  - Temp 37 ??C (98.6 ??F) (Tympanic)  - Resp 18  - Wt 72.2 kg (159 lb 2.8 oz)  - SpO2 99%  - BMI 25.69 kg/m??     70, Cares for self; unable to carry on normal activity or to do active work (ECOG equivalent 1)    General: No acute distress noted.   Central venous access: Line clean, dry, intact. No erythema or drainage noted.   ENT: Moist mucous membranes. Oropharhynx without lesions, erythema or exudate.   Cardiovascular: Pulse normal rate, regularity and rhythm. S1 and S2 normal, without any murmur, rub, or gallop.  Lungs: Faint inspiratory wheezes but no crackles or expiratory wheezing.  Good air movement.   Skin: Warm, dry, intact. No rash noted.    Psychiatry: Alert and oriented to person, place, and time.   Gastrointestinal/Abdomen: Normoactive bowel sounds, abdomen soft, non-tender   Musculoskeletal/Extremities: FROM throughout. No edema  Neurologic: CNII-XII intact. Normal strength and sensation throughout    Acute Graft versus Host Disease Assessment July 24, 2024  Organ stage:    Skin stage: 0  no active (erythematous) rash  Upper GI stage 0 No or intermittent nausea, vomiting or anorexia   Lower GI stage 0 No Diarrhea or <500 mL/day or < 3 episodes per day;   Hepatic stage 0 Bilirubin normal - 2mg /dL   *AST/ALT elevated but likely secondary to pararinfluenza URI (now starting to improve)     Overall grade (Current) Grade 0: No stage 1-4 of any organ  Max Grade of acute GVHD post-HCT: Grade 0: No stage 1-4 of any organ    Assessment/Plan:  1) SCT Summary:  - Type of Transplant: RIC MUD Allo (Fludarabine  40 mg/m2/Melphalan  100  mg/m2)  - Graft Source: Fresh PBSCs10/10, Female, ABO O-, CMV negative     2) Disease: ALL  - Pre BMT disease status: CR MRD Negative  - Signs/symptoms of disease relapse: None   - Restaging plan:    *Next BMBX: Day +90   *Next Chimerism: Day +60    Date Source Unfractionated CD3   07/12/24  PB >95% 93               3) Immunosuppressed: High risk of infections, if patient develops a fever will admit to inpatient unit for IV antibiotics and fever workup   - Prophylaxis:          * Antiviral: Valtrex  500 mg po BID through 2 years post transplant.   Letermovir  480 mg po daily through day +200 to prevent CMV infections   * Antifungal: Fluconazole  400 mg po daily through day +75 (Hold beginning 12/12 with LFT elevations)   *12/19: LFTs worsened likely in setting of Parainfluenza URI so fluc remained on hold   *12/22: LFTs now starting to improve with hopefully improving URI. Will rechallenge with Fluconazole , if LFTs worsen again will need to setup IV micafungin.   * PJP: Sulfa allergic. Dapsone  daily upon platelet engraftment, has tolerated this well in the past. Toxo PCR negative. Start upon platelet engraftment.    *12/5: Hgb improved, will start Dapsone  100mg  daily.   *12/9 Dapsone  held due to increasing LFTs will scheduled for Pentamidine 12/19 but delayed d/t URI  *12/22: Restarting fluc as above, if LFTs stable/improved next visit ok to re-challenge with Dapsone  as well. If LFTs worsen again will need to schedule pentamidine once URI symptoms improve.   *12/24: Resume dapsone  as LFTs continue to improve    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   Previously required Valcyte  treatment. 11/16 CMV viral load undetectable.  - Continue Letermovir  and Valtrex  BID through day +200 post SCT.     Recurrent Acute Sinusitis  Recurrent acute sinusitis with nasal congestion, frontal headache, and low-grade fever. Responded well to Augmentin .  -11/7 current sx resolving, started Augmentin  10 days (10/30-11/9)    Parainfluenza:  -12/16: congestion, cough, sinus pressure. RPP +parainfluenza. Consented to pulmotect study.   - 12/19: 1st nebulizer treatment on Pumotect study, plan for 2nd treatment 12/22 and 3rd treatment 12/23.   - 12/22: Developed fever and N/V after 1st nebulizer. Per patient intolerance to nebulizer she has declined further doses and would like to withdraw from the study. Given increased albuterol  used CXR obtained which was clear. Continue with symptomatic treatment. Starting Agumentin x 10 days for possible secondary sinusitis. Sent screening cultures today due to fever of 101 over the weekend (likely secondary to Paraflu but will rule out bacteremia).   - 12/24: Cough is  worsening today so we repeated a chest x-ray, which was clear.  Patricia [name] is not eligible for any further treatment on PUL-042.  Patricia [name] has been feeling better with intermittent albuterol  nebulizers so I prescribed additional nebulizer vials.    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   Prophylaxis:  - Methotrexate  5 mg/m2 IVP on days +1, +3, +6 and +11  - Tacrolimus      * Goal trough: 5-10     * High risk drug monitoring and dose adjustments per BMT Pharmacist    * Signs/symptoms of toxicity: None      5) Heme  Transfusion criteria: Transfuse 1 unit of PRBCs for hemoglobin <7 and 1 unit of platelets for Plt <10K or bleeding. Patricia [name] does not have a history of transfusion reactions. Consent was obtained and documented on 06/05/24.     Anemia: Now normalized.     Thrombocytopenia: Secondary to chemotherapy. Slowly improving.     Leukopenia: WBC/ANC stable.     6) GI/Liver:   GERD prophylaxis: Pepcid  BID    Nausea:PRN zofran  and compazine     Diarrhea: PRN Imodium , has not needed recently     Elevated liver enzymes:  - Started trending up on 12/5 with mildly elevated AST  - On 12/9 both AST and ALT elevated- Dapsone  and Pepcid  held  - 12/12: AST 112 and ALT 172 normal bili. Will hold Fluconazole  until next visit to re-evaluate. Sending adeno and HHV-6. CMV and EBV have been negative.   - 12/16: AST improved, ALT stable. Like up from URI (tested positive for parainfluenza today). Possibly secondary medication (metformin , fluc, dapsone , and pepcid ). Fluc, dapsone , and pepcid  on hold. If LFts stable/improved next visit would restart fluconazole .   - 12/19: AST/ALT worsened today, likely secondary to parainfluenza. Repeat labs on Monday if LFTs stable/imrpoved plan to re-challenge fluc, if worsen will need to setup IV micafungin.   - 12/22: AST/ALT improving thus likely secondary to URI. Will rechallenge with fluconazole .   - 12/24: AST/ALT continue to improve.  I suspect this is resolution after URI.  Can resume dapsone .     7) Hypomagnesemia:   - Mg is 1.4, stable on current dose 2 tabs TID  Trying to be cautious with oral magnesium  increase given it upsets her stomach. BM now normalized so if Mg drops again could increase oral magnesium  PRN. She does not tolerate IV magnesium  well (nausea)     8)  Heart failure with mildly reduced ejection fraction (HFmrEF)   Followed by [facility]:  Due to anythracycline use  Hx of EF 45-50%, most recent Echo 05/23/24 improved to 55-60%  Hx of prolonged QTc  - Home med Losartan  50 mg daily (held 11/18)      BP Readings from Last 3 Encounters:   07/24/24 122/84   07/23/24 112/87   07/22/24 100/73      9) Pulm/Allergies   - cetirizine  10 mg daily   - albuterol  PRN     10) Menses suppression: Lupron  last given 07/26/22. No menses sense (peri-menopausal/menopausal)  - 12/19: screening urine pregnancy test per study resulted as positive, repeat urine test indeterminate. Serum beta HcG 5.9. Low level Hcg can often be seen in peri-menopausal women who are not pregnant. She has had chemotherapy and only 1 month post transplant  so would consider this a negative pregnancy screen.      11) Type 2 DM exacerbated by steroids: Followed by [facility]. At home uses Dexcom for monitoring. Has follow up with Endocrinology on 07/05/24.   - Increase insulin  glargine from 12u to 15u once daily  - Continue insulin  aspart with meals per carb counting (1 unit for every 8 grams of carbs)  - Endocrine added Metformin  500 mg XR daily on 07/05/24. Next follow up 08/28/23    12) Pain:  - PRN tramadol  50 mg. No oxy as this give patient migraines     13) Anxiety:  Lexapro  10 mg daily (has PRN ativan  available but has not needed)  Reconnected with her local therapist      14) DispoCaregivers: Lives in [place], [place]  Primary caregiver: Jeanie Tate (mom)- first 30 days at [facility] then home if doing well will stay longer if medically needed  Secondary caregiver: Destony Prevost (husband)    15) Bone Health: DEXA 07/09/24: Results with normal bone density    Plan:  - Parainfluenza: Received 1st dose nebulizer 12/19 but then developed fever and N/V. Due to intolerance of nebulizer patient declines further dosing and would like to withdraw from the study. Plan for UA and urine pregnancy screen next visit per study withdrawal protocol.   - Start Augmentin  x 10 days for possible secondary bacterial sinusitis   - LFTs improving. Rechallenge with fluconazole  today. If LFTs continue to improve will also add back Dapsone  next visit.   - CVL removal pushed to 12/23 with current parainfluenza infection (has port)   - RTC Wednesday.     I personally spent 64 minutes face-to-face and non-face-to-face in the care of this patient, which includes all pre, intra, and post visit time on the date of service.  All documented time was specific to the E/M visit and does not include any procedures that may have been performed.  Time was spent on clinical assessment, placing orders / referrals, and/or prescription management, all pertaining to the diagnoses listed above.       Deward CHRISTELLA Church, MD  [facility] Bone Marrow Transplant and Cellular Therapy Program      Future Appointments   Date Time Provider Department Center   07/30/2024 12:15 PM ONCBMT LABS [facility]   07/30/2024  1:00 PM ONCBMT APP A [facility]   08/02/2024 12:00 PM ONCBMT LABS [facility]   08/02/2024  1:00 PM ONCBMT APP B [facility]   08/06/2024 12:00 PM ONCBMT LABS [facility]   08/06/2024  1:00 PM ONCBMT APP A [facility]   08/08/2024  7:30 AM ONCINF CHAIR 18 HONC3UCA [facility]   08/09/2024 12:00 PM ONCBMT LABS [facility]   08/09/2024  1:00 PM ONCBMT APP A [facility]   08/13/2024  9:30 AM ONCBMT LABS [facility]   08/13/2024 10:30 AM Patricia [name] Dossie Craze, MD [facility]ON[facility]   08/13/2024  1:25 PM Delores Fleeting, MD UNCHRTVASET [facility]   08/27/2024  9:50 AM Kome, Arlean CHRISTELLA, CPP UNCDIABENDET [facility]   09/05/2024 10:00 AM Jarold Motto T UNCDIABENDET [facility]   09/10/2024  9:00 AM ONCBMT LABS [facility]   09/10/2024 10:00 AM ONCBMT APP A [facility]   09/10/2024 11:00 AM Polly Alyce Dover, MD ONCMULTI [facility]   09/10/2024 12:00 PM ONCBMT PROCEDURES HONC3UCA [facility]   09/17/2024  9:15 AM ONCBMT LABS [facility]  09/17/2024 10:00 AM Patricia [name] Dossie Craze, MD [facility]ON[facility]MT [facility]   09/17/2024 11:00 AM CARSER EKG HVCARD2UMH [facility]   09/24/2024  8:15 AM ONCBMT LABS [facility]   09/24/2024  9:00 AM ONCBMT APP A [facility]   09/24/2024 10:00 AM ONCBMT PHARMACY [facility]   09/24/2024 10:30 AM ONCBMT COORDINATOR [facility]   12/10/2024  9:30 AM ONCBMT LABS [facility]   12/10/2024 10:30 AM ONCBMT PHARMACY [facility]   12/27/2024  9:40 AM Storm Honer, MD UNCDIABENDET [facility]

## 2024-07-22 NOTE — Patient Instructions (Addendum)
-   Given the fever over the weekend we will send screening cultures to rule out bacterial infection.  - I have resent the Augmentin  downstairs for a 10 day course.   - We will hold on further nebulizer treatments given the concern for reaction.   - Restart Fluconazole  2 tabs daily.   - We will obtain a chest xray today to     We will cancel the infusion today and tomorrow.     A pharmacist will call you if I need to make changes to your tacrolimus  dose.     -------------------------------------------------------------------------------  Return to clinic on Wednesday to see one of the providers. You will receive a time when you check out today.    Lab Results   Component Value Date    WBC 5.0 07/22/2024    HGB 12.2 07/22/2024    HCT 36.1 07/22/2024    PLT 147 (L) 07/22/2024     Lab Results   Component Value Date    NA 143 07/22/2024    K 3.4 07/22/2024    CL 104 07/22/2024    CO2 26.0 07/22/2024    BUN 10 07/22/2024    CREATININE 0.67 07/22/2024    GLU 283 (H) 07/22/2024    CALCIUM  9.6 07/22/2024    MG 1.6 07/22/2024    PHOS 4.5 06/26/2024     Lab Results   Component Value Date    BILITOT 0.3 07/22/2024    BILIDIR 0.10 06/26/2024    PROT 7.0 07/22/2024    ALBUMIN 3.9 07/22/2024    ALT 315 (H) 07/22/2024    AST 140 (H) 07/22/2024    ALKPHOS 81 07/22/2024    GGT 836 (H) 11/25/2023     Lab Results   Component Value Date    INR 1.15 06/26/2024    APTT 29.0 06/26/2024       For prescription refills:   For refills, please check your medication bottles to see if you have additional refills left. If so, please call your pharmacy and follow the directions to request a refill. If you do not have any refills left, please make a request during your clinic visit or by submitting a request through MyChart or by calling [phone]. Please allow 24 hours if your request is made during the week or 48 hours if requests are made on the weekends or holidays. --------------------------------------------------------------------------------------------------------------------  For appointments & questions Monday through Friday 8 AM-4:30 PM     Please call [phone]     On Nights, Weekends and [place] [phone]. This is the nurse's station. They will contact the provider for you.     Please visit [email or web address], a resource created just for family members and caregivers.  This website lists support services, how and where to ask for help. It has tools to assist you as you help us  care for your loved one.    [facility]  [address]  [place], [place] [address]  [email or web address]

## 2024-07-22 NOTE — Progress Notes (Signed)
 Bone Marrow Transplant and Cellular Therapy Program  Immunosuppressive Therapy Note    Patricia [name] is a 48 y.o. 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 Patricia [name] is currently day +40.    Concurrent CYP3A4 inhibitors:  - Letermovir   - Fluconazole  (on hold starting 12/13 due to elevated LFTs) restarted 07/22/24     Current dose: Held x1 dose (12/13 PM), then resumed reduced dose of 0.5 mg qAM + 1 mg qPM starting 12/14 AM     Goal tacrolimus  Level: 5-10 ng/mL    Resulted level: 4.4 ng/mL drawn at 11:07 AM, patient reports that she missed morning doses of tacrolimus  on Saturday and Sunday due to sleeping/not feeling well     Lab Results   Component Value Date/Time    TACROLIMUS  4.4 07/22/2024 11:07 AM    TACROLIMUS  5.2 07/19/2024 12:56 PM    TACROLIMUS  6.8 07/16/2024 12:56 PM    TACROLIMUS  10.1 06/27/2024 09:37 AM    TACROLIMUS  7.7 06/26/2024 08:21 AM    TACROLIMUS  9.7 06/24/2024 08:43 AM       Lab Results   Component Value Date/Time    CREATININE 0.67 07/22/2024 11:07 AM    CREATININE 0.65 07/19/2024 12:56 PM    CREATININE 0.74 07/16/2024 12:56 PM      A/P  - tacrolimus  trough is slightly low today in setting of missed doses over the weekend. Patient feeling better and back on track with her tacrolimus  doses. Also, fluconazole  restarted today (07/22/24)   - SCr WNL and overall stable   - T bili and Alk phos WNL; AST/ALT elevated   - Given the above, plan to continue tacrolimus  0.5 mg qAM + 1 mg qPM and plan to recheck level and CMP at next clinic visit    We will continue to monitor levels.  Patient will be followed for changes in renal and hepatic function, toxicity, and efficacy.     Elizebeth Has, PharmD, BCOP  Clinical Pharmacist Practitioner, BMTCT

## 2024-07-22 NOTE — Progress Notes (Signed)
 All procedures explained and reviewed rational 5 mins. Verbalized understanding.  Two sets of blood cultures drawn per policy per order. 1235 First set drawn from [facility]. Site wnl. 1240 Second set drawn from CVC distal line. Tolerated well. Going with mother to get chest xray.

## 2024-07-22 NOTE — Progress Notes (Signed)
 Bone Marrow Transplant Research Note:  Date: 07/20/24     Clinical Study:  PUL-042-207 - A Phase 2 Multiple Dose Study to Evaluate the Efficacy and Safety of PUL-042 Inhalation Solution in Reducing Lower Respiratory Tract Complications in Patients with Hematologic Malignancies and Recipients of Hematopoietic Stem Cell Transplantation (HSCT) with Documented Viral Infections with Parainfluenza Virus (PIV), Human Metapneumovirus (hMPV) or Respiratory Syncytial Virus (RSV)      Study Visit:      Today the study coordinator saw Ms. Rocky JINNY Ranger for follow-up with Duwaine Abrahams. She let me know that she no longer wanted to continue receiving study drug. We are withdrawing her from the study. Her symptom score for 12/22 is the following:     -Cough: moderate (2)  -Shortness of breath: mild (1)  -Sputum production: moderate (2)  -Chest pain: mild (1)  Total: 6

## 2024-07-23 ENCOUNTER — Inpatient Hospital Stay: Admit: 2024-07-23 | Discharge: 2024-07-24 | Payer: PRIVATE HEALTH INSURANCE

## 2024-07-23 ENCOUNTER — Inpatient Hospital Stay: Admit: 2024-07-23 | Discharge: 2024-07-23 | Payer: PRIVATE HEALTH INSURANCE

## 2024-07-23 ENCOUNTER — Ambulatory Visit: Admit: 2024-07-23 | Discharge: 2024-07-23 | Payer: PRIVATE HEALTH INSURANCE

## 2024-07-23 ENCOUNTER — Ambulatory Visit: Admit: 2024-07-23 | Discharge: 2024-07-24 | Payer: PRIVATE HEALTH INSURANCE

## 2024-07-23 ENCOUNTER — Other Ambulatory Visit: Admit: 2024-07-23 | Discharge: 2024-07-23 | Payer: PRIVATE HEALTH INSURANCE

## 2024-07-23 DIAGNOSIS — C9100 Acute lymphoblastic leukemia not having achieved remission: Principal | ICD-10-CM

## 2024-07-23 LAB — CMV DNA, QUANTITATIVE, PCR: CMV VIRAL LD: NOT DETECTED

## 2024-07-23 LAB — EBV QUANTITATIVE PCR, BLOOD: EBV VIRAL LOAD RESULT: NOT DETECTED

## 2024-07-23 NOTE — Progress Notes (Signed)
 Bone Marrow Transplant Research Note:  Date: 07/23/24     Clinical Study:  PUL-042-207 - A Phase 2 Multiple Dose Study to Evaluate the Efficacy and Safety of PUL-042 Inhalation Solution in Reducing Lower Respiratory Tract Complications in Patients with Hematologic Malignancies and Recipients of Hematopoietic Stem Cell Transplantation (HSCT) with Documented Viral Infections with Parainfluenza Virus (PIV), Human Metapneumovirus (hMPV) or Respiratory Syncytial Virus (RSV)      Study Visit:      Today the study coordinator spoke with Ms. JAZARIAH TEALL on the phone. Discussed study follow-up options that the sponsor outlined. Ms. Mercier agreed to continue with study follow-up and complete all study tasks except for receiving more study drug. Her next study visit will be tomorrow, 12/24.

## 2024-07-23 NOTE — Progress Notes (Signed)
 Outpatient BMT SW Note: SW attempted to follow up with pt again today to try to assist her with applying for the post transplant grant through [employer]. She was not available at time of call. SW left a VM requesting a return call.    2:45 pm    SW received a return call from pt. SW completed an post [employer] application with pt over the phone today.    SW offered to follow up with pt when she heard from [employer] team.    Pt was appreciative.

## 2024-07-23 NOTE — Progress Notes (Signed)
 The following criteria has been met per discharge order:     1. Vital signs completed and stable.  2. No respiratory difficulty.    3. No bleeding or hematoma at procedure site. Gauze and tegaderm dressing applied to line removal site.   4. Patient is ambulatory.  5. Aldrete score criteria has been met.   6. Significant pain is absent.   7. An escort is available.   8. Postsurgical care have been reviewed with the patient and escort. Understanding was verbalized.  9. Written postoperative instructions have been provided.

## 2024-07-23 NOTE — Op Note (Signed)
 VIR Post-Procedure Note:   07/23/2024 10:11 AM    Procedure Name: left IJV tunneled triple lumen Hickman catheter removal    Pre-Op Diagnosis and Indication: ALL    Post-Op Diagnosis: Same as pre-operative diagnosis    VIR Provider: Jed Cap, MD, MD    Time out:   Prior to the procedure, a time out was performed with all team members present. During the time out, the patient, procedure and procedure site when applicable were verbally verified by the team members and Dr. Jed Cap, MD.     Description of procedure:   Successful removal of the left IJV left chest tunneled triple lumen Hickman catheter with no complication.    Sedation: None    Estimated Blood Loss: less than 5 mL  Complications: None    See detailed procedure note with images in PACS Lennice).    The patient tolerated the procedure well without incident or complication.    Interventional Radiologist: Jed Cap, MD, MD  Date/Time: 07/23/2024 10:11 AM

## 2024-07-24 ENCOUNTER — Inpatient Hospital Stay: Admit: 2024-07-24 | Discharge: 2024-07-24 | Payer: PRIVATE HEALTH INSURANCE

## 2024-07-24 ENCOUNTER — Other Ambulatory Visit: Admit: 2024-07-24 | Discharge: 2024-07-24 | Payer: PRIVATE HEALTH INSURANCE

## 2024-07-24 ENCOUNTER — Ambulatory Visit
Admit: 2024-07-24 | Discharge: 2024-07-24 | Payer: PRIVATE HEALTH INSURANCE | Attending: Internal Medicine | Primary: Internal Medicine

## 2024-07-24 DIAGNOSIS — C9100 Acute lymphoblastic leukemia not having achieved remission: Principal | ICD-10-CM

## 2024-07-24 DIAGNOSIS — R059 Cough, unspecified type: Principal | ICD-10-CM

## 2024-07-24 DIAGNOSIS — Z9481 Bone marrow transplant status: Principal | ICD-10-CM

## 2024-07-24 DIAGNOSIS — Z9484 Stem cells transplant status: Principal | ICD-10-CM

## 2024-07-24 LAB — COMPREHENSIVE METABOLIC PANEL
ALBUMIN: 3.7 g/dL (ref 3.4–5.0)
ALKALINE PHOSPHATASE: 75 U/L (ref 46–116)
ALT (SGPT): 212 U/L — ABNORMAL HIGH (ref 10–49)
ANION GAP: 13 mmol/L (ref 5–14)
AST (SGOT): 67 U/L — ABNORMAL HIGH (ref <=34)
BILIRUBIN TOTAL: 0.3 mg/dL (ref 0.3–1.2)
BLOOD UREA NITROGEN: 8 mg/dL — ABNORMAL LOW (ref 9–23)
BUN / CREAT RATIO: 14
CALCIUM: 9.1 mg/dL (ref 8.7–10.4)
CHLORIDE: 107 mmol/L (ref 98–107)
CO2: 25 mmol/L (ref 20.0–31.0)
CREATININE: 0.58 mg/dL (ref 0.55–1.02)
EGFR CKD-EPI (2021) FEMALE: >90 mL/min/1.73m2 (ref >=60)
GLUCOSE RANDOM: 230 mg/dL — ABNORMAL HIGH (ref 70–179)
POTASSIUM: 3.8 mmol/L (ref 3.4–4.8)
PROTEIN TOTAL: 6.4 g/dL (ref 5.7–8.2)
SODIUM: 145 mmol/L (ref 135–145)

## 2024-07-24 LAB — CBC W/ AUTO DIFF
BASOPHILS ABSOLUTE COUNT: 0 10*9/L (ref 0.0–0.1)
BASOPHILS RELATIVE PERCENT: 0.8 %
EOSINOPHILS ABSOLUTE COUNT: 0.6 10*9/L — ABNORMAL HIGH (ref 0.0–0.5)
EOSINOPHILS RELATIVE PERCENT: 12.1 %
HEMATOCRIT: 35.4 % (ref 34.0–44.0)
HEMOGLOBIN: 12 g/dL (ref 11.3–14.9)
LYMPHOCYTES ABSOLUTE COUNT: 1.2 10*9/L (ref 1.1–3.6)
LYMPHOCYTES RELATIVE PERCENT: 22.8 %
MEAN CORPUSCULAR HEMOGLOBIN CONC: 34 g/dL (ref 32.0–36.0)
MEAN CORPUSCULAR HEMOGLOBIN: 32.6 pg — ABNORMAL HIGH (ref 25.9–32.4)
MEAN CORPUSCULAR VOLUME: 95.8 fL — ABNORMAL HIGH (ref 77.6–95.7)
MEAN PLATELET VOLUME: 10 fL (ref 6.8–10.7)
MONOCYTES ABSOLUTE COUNT: 0.4 10*9/L (ref 0.3–0.8)
MONOCYTES RELATIVE PERCENT: 6.9 %
NEUTROPHILS ABSOLUTE COUNT: 3 10*9/L (ref 1.8–7.8)
NEUTROPHILS RELATIVE PERCENT: 57.4 %
PLATELET COUNT: 156 10*9/L (ref 150–450)
RED BLOOD CELL COUNT: 3.69 10*12/L — ABNORMAL LOW (ref 3.95–5.13)
RED CELL DISTRIBUTION WIDTH: 16.3 % — ABNORMAL HIGH (ref 12.2–15.2)
WBC ADJUSTED: 5.2 10*9/L (ref 3.6–11.2)

## 2024-07-24 LAB — URINALYSIS WITH MICROSCOPY
BACTERIA: NONE SEEN /HPF
BILIRUBIN UA: NEGATIVE
GLUCOSE UA: NEGATIVE
HYALINE CASTS: 3 /LPF — ABNORMAL HIGH (ref 0–1)
KETONES UA: NEGATIVE
NITRITE UA: NEGATIVE
PH UA: 6 (ref 5.0–9.0)
PROTEIN UA: 30 — AB
RBC UA: 57 /HPF — ABNORMAL HIGH (ref <=4)
SPECIFIC GRAVITY UA: 1.023 (ref 1.003–1.030)
SQUAMOUS EPITHELIAL: 1 /HPF (ref 0–5)
UROBILINOGEN UA: <2
WBC UA: 9 /HPF — ABNORMAL HIGH (ref 0–5)

## 2024-07-24 LAB — TACROLIMUS LEVEL: TACROLIMUS BLOOD: 8.9 ng/mL

## 2024-07-24 LAB — MAGNESIUM: MAGNESIUM: 1.6 mg/dL (ref 1.6–2.6)

## 2024-07-24 LAB — PREGNANCY, URINE: PREGNANCY TEST URINE: POSITIVE — AB

## 2024-07-24 MED ORDER — TACROLIMUS 0.5 MG CAPSULE, IMMEDIATE-RELEASE
ORAL_CAPSULE | Freq: Two times a day (BID) | ORAL | 5 refills | 30.00000 days | Status: CP
Start: 2024-07-24 — End: 2024-07-24

## 2024-07-24 MED ORDER — ALBUTEROL SULFATE 2.5 MG/3 ML (0.083 %) SOLUTION FOR NEBULIZATION
RESPIRATORY_TRACT | 24 refills | 5.00000 days | Status: CP | PRN
Start: 2024-07-24 — End: ?
  Filled 2024-07-24: qty 90, 5d supply, fill #0

## 2024-07-24 MED ORDER — DAPSONE 100 MG TABLET
ORAL_TABLET | Freq: Every day | ORAL | 6 refills | 30.00000 days | Status: CP
Start: 2024-07-24 — End: ?

## 2024-07-24 NOTE — Telephone Encounter (Signed)
 [facility]  Immunosuppressive Therapy Note    Patricia [name] is a 48 y.o. 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 Patricia [name] is currently day +42.    Concurrent CYP3A4 inhibitors:  - Letermovir   - Fluconazole  (on hold starting 12/13 due to elevated LFTs) restarted 07/22/24     Current dose: 0.5 mg qAM + 1 mg qPM    Goal tacrolimus  Level: 5-10 ng/mL    Resulted level: 8.9 ng/mL drawn at 11:10 AM, confirmed with patient she appropriately held morning dose prior to labs    Lab Results   Component Value Date/Time    TACROLIMUS  8.9 07/24/2024 11:10 AM    TACROLIMUS  4.4 07/22/2024 11:07 AM    TACROLIMUS  5.2 07/19/2024 12:56 PM    TACROLIMUS  10.1 06/27/2024 09:37 AM    TACROLIMUS  7.7 06/26/2024 08:21 AM    TACROLIMUS  9.7 06/24/2024 08:43 AM       Lab Results   Component Value Date/Time    CREATININE 0.58 07/24/2024 11:10 AM    CREATININE 0.67 07/22/2024 11:07 AM    CREATININE 0.65 07/19/2024 12:56 PM      A/P  - tacrolimus  trough is likely on the upper end of goal range, however has doubled since recent check on 12/22; current dose should be at steady state  - SCr WNL and overall stable   - Tbili and Alk Phos are normal; AST and ALT remain elevated (67 and 212, respectively) however continue to downtrend  - Fluconazole  was restarted on 12/22 given improving LFTs, which is the likely cause of the increasing level since it is not at steady state yet  - Given the above, plan to decrease tacrolimus  to 0.5 mg PO BID starting 12/24 PM and plan to recheck level and CMP at next clinic visit  - I communicated the plan with Patricia [name] who verbalized understanding.    We will continue to monitor levels.  Patient will be followed for changes in renal and hepatic function, toxicity, and efficacy.     Joesph Arthur, PharmD, BCOP  Clinical Pharmacist, [facility]

## 2024-07-24 NOTE — Progress Notes (Signed)
 Bone Marrow Transplant Research Note:  Date: 07/24/24     Clinical Study:  [id number] - A Phase 2 Multiple Dose Study to Evaluate the Efficacy and Safety of PUL-042 Inhalation Solution in Reducing Lower Respiratory Tract Complications in Patients with Hematologic Malignancies and Recipients of Hematopoietic Stem Cell Transplantation (HSCT) with Documented Viral Infections with Parainfluenza Virus (PIV), Human Metapneumovirus (hMPV) or Respiratory Syncytial Virus (RSV)      Study Visit:      Today the study coordinator spoke with Patricia [name] in clinic for her Day 6 follow-up. She completed study tasks, including a nasal swab and symptom score assessment. She states she is feeling worse today compared to yesterday.    Her symptom score for 07/24/24 is the following:  -Cough: severe (3)  -Shortness of breath: moderate (2)  -Sputum production: moderate (2)  -Chest pain: moderate (2)  Total: 9     Her symptom score for 07/23/24 was the following:  -Cough: moderate (2)  -Shortness of breath: mild (1)  -Sputum production: moderate (2)  -Chest pain: mild (1)  Total: 6

## 2024-07-24 NOTE — Patient Instructions (Addendum)
 It was nice to see you    You can start taking dapsone  today to reduce the risk of PJP (a type of pneumonia)    I have sent a prescription to the central pharmacy for albuterol  nebulizers    I think we should get a chest x-ray to see if there are any signs of pneumonia    We will follow up with social work about assistance for lodging at the [facility].

## 2024-07-26 DIAGNOSIS — C9100 Acute lymphoblastic leukemia not having achieved remission: Principal | ICD-10-CM

## 2024-07-26 NOTE — Progress Notes (Signed)
 Bone Marrow Transplant Research Note:  Date: 07/26/24     Clinical Study:  PUL-[id number] - A Phase 2 Multiple Dose Study to Evaluate the Efficacy and Safety of PUL-042 Inhalation Solution in Reducing Lower Respiratory Tract Complications in Patients with Hematologic Malignancies and Recipients of Hematopoietic Stem Cell Transplantation (HSCT) with Documented Viral Infections with Parainfluenza Virus (PIV), Human Metapneumovirus (hMPV) or Respiratory Syncytial Virus (RSV)      Study Visit:      Today the study coordinator spoke with [name] over the phone for her Day 7 follow-up. She completed study tasks, including an AE assessment, concomitant medication assessment, and symptom score assessment. No new AE. She states that she re-started her Dapsone  on 07/25/24 and took a puff of her albuterol  on 07/26/24.     Her symptom score for 07/26/24 is the following:  -Cough: moderate (2)  -Shortness of breath: moderate (2)  -Sputum production: moderate (2)  -Chest pain: mild (1)  Total: 7     Her symptom score for 07/25/24 was the following:  -Cough: moderate (2)  -Shortness of breath: moderate (2)  -Sputum production: moderate (2)  -Chest pain: mild (1)  Total: 7

## 2024-07-29 DIAGNOSIS — C9100 Acute lymphoblastic leukemia not having achieved remission: Principal | ICD-10-CM

## 2024-07-29 NOTE — Progress Notes (Signed)
 Bone Marrow Transplant Research Note:  Date: 07/29/2024     Clinical Study:  [id number]-401 - A Prospective, Observational Cohort Study of Participants at Risk for Chronic Graft-Versus-Host Disease in the [place]  (THRIVE)     Study Visit:     Today the study coordinator spoke with [name] over the phone for her Day 10 follow-up. She completed study tasks, including an AE assessment, concomitant medication assessment, and symptom score assessment. No new AEs. She still has a stuffed up feeling but overall symptoms are improving. She has not had changes to concomitant medications since her last clinic visit on Jul 24, 2024.     Her symptom score for 07/30/24 is the following:  -Cough: mild (1)  -Shortness of breath: mild (1)  -Sputum production: mild (1)  -Chest pain: mild (1)  Total: 4

## 2024-07-29 NOTE — Progress Notes (Addendum)
 BMT Clinic Follow-up    [name]  MRN: [id number]  Encounter date: 07/30/2024    Referring Physician: Estelle Toribio SAUNDERS, MD  BMT Attending MD: Dr. Jeaneen    Transplant Day: 62     Reason for Visit: Post allogeneic transplant follow up. This visit included intensive monitoring of high-risk medications/disease states given risk of severe myelosuppression, immunosuppression, infection, and other toxicities. Review included CBC, electrolytes and other blood tests, as well as direct assessment of the patient for symptoms suggesting toxicity.    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 Her transplant course was complicated by T2DM with steroid induced hyperglycemia (stable on current regimen at discharge), prolonged Qtc on admission (resolved with last Qtc 460 from 11/20), non-infectious diarrhea (resolved without recent need for imodium ). 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 Received most recent dose of IVIG on 11/26.     Interval History  Patricia [name] is a 48 y.o. 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 Patricia [name] is now s/p a matched unrelated donor stem cell transplant      Last week Patricia [name] was diagnosed with parainfluenza and enrolled on PUL-042.  She received received her first nebulizer treatment on 07/19/2024 and overnight developed fever and vomiting.  She withdrew from the trial on Monday, 07/22/2024 when she was seen in clinic.    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  - Chimerism: >95% unfractionated donor, 93% CD3 donor  - Eosinophils: 0.8, without associated rash, nausea, vomiting, or diarrhea  - Continues tacrolimus  as per protocol  - Concern regarding white blood cell counts, ANC thresholds, and engraftment variability  - 10/10 HLA match    Parainfluenza viral infection, respiratory symptoms  - Recovering from parainfluenza viral infection  - Persistent but improving cough, post-nasal drip, and mild hoarseness  - No new or worsening respiratory symptoms  - No fever, chills, or sweats  - Albuterol  provides symptomatic relief    Immunodeficiency and infection prophylaxis  - 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 letermovir     CXR Friday for study and study specific nasal swab    Her symptom score for 07/30/24 is the following:  -Cough: mild (1)  -Shortness of breath: mild (1)  -Sputum production: mild (1)  -Chest pain: mild (1)  Total:4       Physical Exam:  BP 118/85  - Pulse 90  - Temp 36.9 ??C (98.4 ??F) (Oral)  - Wt 73.8 kg (162 lb 11.2 oz)  - SpO2 98%  - BMI 26.26 kg/m??     70, Cares for self; unable to carry on normal activity or to do active work (ECOG equivalent 1)    General: No acute distress noted.   Central venous access: Port in place. Line clean, dry, intact. No erythema or drainage  noted.   ENT: Moist mucous membranes. Oropharhynx without lesions, erythema or exudate.   Cardiovascular: Pulse normal rate, regularity and rhythm. S1 and S2 normal, without any murmur, rub, or gallop.  Lungs: Clear Good air movement.   Skin: Warm, dry, intact. No rash noted.    Psychiatry: Alert and oriented to person, place, and time.   Gastrointestinal/Abdomen: Normoactive bowel sounds, abdomen soft, non-tender   Musculoskeletal/Extremities: FROM throughout. No edema  Neurologic: CNII-XII intact. Normal strength and sensation throughout          Acute GVHD Assessment          Most Recent Value    07/02/2024 - 07/30/2024 Allogeneic BMT Unknown Phase - Planned 06/12/2024-Post 1 Year    07/02/2024 08:26 Allogeneic BMT Unknown Phase - Planned 06/12/2024-Post 1 Year    07/03/2024 18:56 Allogeneic BMT Unknown Phase - Planned 06/12/2024-Post 1 Year    07/05/2024 18:18 Allogeneic BMT Unknown Phase - Planned 06/12/2024-Post 1 Year    07/30/2024 13:23   Acute GVHD Grade and Severity   Overall Grade (Przepiorka) 0  07/30/2024 0 0 0 0       Assessment/Plan:  1) SCT Summary:  - Type of Transplant: RIC MUD Allo (Fludarabine  40 mg/m2/Melphalan  100  mg/m2)  - Graft Source: Fresh PBSCs10/10, Female, ABO O-, CMV negative     2) Disease: ALL  - Pre BMT disease status: CR MRD Negative  - Signs/symptoms of disease relapse: None   - Restaging plan:    *Next BMBX: Day +90   *Next Chimerism: Day +60    Date Source Unfractionated CD3   07/12/24  PB >95% 93               3) Immunosuppressed: High risk of infections, if patient develops a fever will admit to inpatient unit for IV antibiotics and fever workup   - Prophylaxis:          * Antiviral: Valtrex  500 mg po BID through 2 years post transplant.   Letermovir  480 mg po daily through day +200 to prevent CMV infections   * Antifungal: Fluconazole  400 mg po daily through day +75  * PJP: Sulfa allergic. Dapsone  daily upon platelet engraftment, has tolerated this well in the past. Toxo PCR negative. Start upon platelet engraftment.     - Vaccines to begin at 6 months     - Prior infections   Hx CMV viremia: Patricia [name], ICID is following.   Previously required Valcyte  treatment. 11/16 CMV viral load undetectable.  - Continue Letermovir  and Valtrex  BID through day +200 post SCT.     Recurrent Acute Sinusitis  Recurrent acute sinusitis with nasal congestion, frontal headache, and low-grade fever. Responded well to Augmentin .  -11/7 current sx resolving, started Augmentin  10 days (10/30-11/9)    Parainfluenza:  -12/16: congestion, cough, sinus pressure. RPP +parainfluenza. Consented to pulmotect study.   - 12/19: 1st nebulizer treatment on Pumotect study, plan for 2nd treatment 12/22 and 3rd treatment 12/23.   - 12/22: Developed fever and N/V after 1st nebulizer. Per patient intolerance to nebulizer she has declined further doses and would like to withdraw from the study. Given increased albuterol  used CXR obtained which was clear. Continue with symptomatic treatment. Starting Agumentin x 10 days for possible secondary sinusitis. Sent screening cultures today due to fever of 101 over the weekend (likely secondary to Paraflu but will rule out bacteremia).   - 12/24: Cough is worsening today so we repeated a chest x-ray, which was clear.  Patricia [name] is not eligible for any further treatment on PUL-042.  Patricia [name] has been feeling better with intermittent albuterol  nebulizers so I prescribed additional nebulizer vials.  07/30/24: Parainfluenza viral respiratory infection in immunocompromised host  Recovering from parainfluenza with residual cough and mild dysphonia. Stable but at increased risk for complications due to immunocompromised status.  - Ordered chest x-ray and nasal respiratory swab for Friday visit.  - Continued albuterol  as needed.     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   Prophylaxis:  - Methotrexate  5 mg/m2 IVP on days +1, +3, +6 and +11  - Tacrolimus      * Goal trough: 5-10     * High risk drug monitoring and dose adjustments per BMT Pharmacist    * Signs/symptoms of toxicity: None      07/30/24:- Monitored for GVHD signs, especially with elevated eosinophils.    5) Heme  Transfusion criteria: Transfuse 1 unit of PRBCs for hemoglobin <7 and 1 unit of platelets for Plt <10K or bleeding. Patricia [name]. Patricia [name] does not have a history of transfusion reactions. Consent was obtained and documented on 06/05/24.     Anemia: Now normalized.     Thrombocytopenia: Secondary to chemotherapy. Slowly improving.     Leukopenia: WBC/ANC stable.     6) GI/Liver:   GERD prophylaxis: Pepcid  BID    Nausea:PRN zofran  and compazine     Diarrhea: PRN Imodium , has not needed recently     Elevated liver enzymes:  - Started trending up on 12/5 with mildly elevated AST  - On 12/9 both AST and ALT elevated- Dapsone  and Pepcid  held  - 12/12: AST 112 and ALT 172 normal bili. Will hold Fluconazole  until next visit to re-evaluate. Sending adeno and HHV-6. CMV and EBV have been negative.   - 12/16: AST improved, ALT stable. Like up from URI (tested positive for parainfluenza today). Possibly secondary medication (metformin , fluc, dapsone , and pepcid ). Fluc, dapsone , and pepcid  on hold. If LFts stable/improved next visit would restart fluconazole .   - 12/19: AST/ALT worsened today, likely secondary to parainfluenza. Repeat labs on Monday if LFTs stable/imrpoved plan to re-challenge fluc, if worsen will need to setup IV micafungin.   - 12/22: AST/ALT improving thus likely secondary to URI. Will rechallenge with fluconazole .   - 12/24: AST/ALT continue to improve.  I suspect this is resolution after URI.  Can resume dapsone .     7) Hypomagnesemia:   Mag chelate 2 tabs TID    8)  Heart failure with mildly reduced ejection fraction (HFmrEF)   Followed by [facility]:  Due to anythracycline use  Hx of EF 45-50%, most recent Echo 05/23/24 improved to 55-60%  Hx of prolonged QTc  - Home med Losartan  50 mg daily (held 11/18)      BP Readings from Last 3 Encounters:   07/30/24 118/85   07/24/24 122/84   07/23/24 112/87      9) Pulm/Allergies   - cetirizine  10 mg daily   - albuterol  PRN     10) Menses suppression: Lupron  last given 07/26/22. No menses sense (peri-menopausal/menopausal)  - 12/19: screening urine pregnancy test per study resulted as positive, repeat urine test indeterminate. Serum beta HcG 5.9. Low level Hcg can often be seen in peri-menopausal women who are not pregnant. She has had  chemotherapy and only 1 month post transplant so would consider this a negative pregnancy screen.      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 At home uses Dexcom for monitoring. Has follow up with Endocrinology on 07/05/24.   - Increase insulin  glargine from 12u to 15u once daily  - Continue insulin  aspart with meals per carb counting (1 unit for every 8 grams of carbs)  - Endocrine added Metformin  500 mg XR daily on 07/05/24. Next follow up 08/28/23    12) Pain:  - PRN tramadol  50 mg. No oxy as this give patient migraines     13) Anxiety:  Lexapro  10 mg daily (has PRN ativan  available but has not needed)  Reconnected with her local therapist      14) DispoCaregivers: Lives in [place], [place]  Primary caregiver: Jeanie Tate (mom)- first 30 days at [facility] then home if doing well will stay longer if medically needed  Secondary caregiver: Adelin Ventrella (husband)    15) Bone Health: DEXA 07/09/24: Results with normal bone density      I personally spent 64 minutes face-to-face and non-face-to-face in the care of this patient, which includes all pre, intra, and post visit time on the date of service.  All documented time was specific to the E/M visit and does not include any procedures that may have been performed.  Time was spent on clinical assessment, placing orders / referrals, and/or prescription management, all pertaining to the diagnoses listed above.       Maralee [name] Jullien Granquist, FNP  [facility]      Future Appointments   Date Time Provider Department Center   08/02/2024 12:00 PM ONCBMT LABS [facility]   08/02/2024 12:15 PM Markey, [name] [facility]   08/02/2024  1:00 PM ONCBMT APP B [facility]   08/06/2024 12:00 PM ONCBMT LABS [facility]   08/06/2024  1:00 PM ONCBMT APP A [facility]   08/08/2024  7:30 AM ONCINF CHAIR 18 [facility]3U[facility]   08/09/2024 12:00 PM ONCBMT LABS [facility]   08/09/2024  1:00 PM ONCBMT APP A [facility]   08/13/2024  9:30 AM ONCBMT LABS [facility]   08/13/2024 10:30 AM Patricia [name] Dossie Craze, MD [facility]   08/13/2024  1:25 PM Delores Fleeting, MD UNCHRTVASET [facility]   08/27/2024  9:50 AM Kome, Arlean HERO, CPP UNCDIABENDET [facility]   09/05/2024 10:00 AM Jarold Motto T UNCDIABENDET [facility]   09/10/2024  9:00 AM ONCBMT LABS [facility]   09/10/2024 10:00 AM ONCBMT APP A [facility]   09/10/2024 11:00 AM Polly Alyce Dover, MD ONCMULTI [facility]   09/10/2024 12:00 PM ONCBMT PROCEDURES [facility]ON[facility]   09/17/2024  9:15 AM ONCBMT LABS [facility]   09/17/2024 10:00 AM Patricia [name] Dossie Craze, MD [facility]   09/17/2024 11:00 AM CARSER EKG [id number] [facility]   09/24/2024  8:15 AM ONCBMT LABS [facility]   09/24/2024  9:00 AM ONCBMT APP A [facility]   09/24/2024 10:00 AM ONCBMT PHARMACY [facility]   09/24/2024 10:30 AM ONCBMT COORDINATOR [facility]   12/10/2024  9:30 AM ONCBMT LABS [facility]   12/10/2024 10:30 AM ONCBMT PHARMACY [facility]   12/27/2024  9:40 AM Storm Honer, MD UNCDIABENDET [facility]

## 2024-07-30 ENCOUNTER — Ambulatory Visit
Admit: 2024-07-30 | Discharge: 2024-07-31 | Payer: PRIVATE HEALTH INSURANCE | Attending: Critical Care Medicine | Primary: Critical Care Medicine

## 2024-07-30 ENCOUNTER — Other Ambulatory Visit: Admit: 2024-07-30 | Discharge: 2024-07-31 | Payer: PRIVATE HEALTH INSURANCE

## 2024-07-30 DIAGNOSIS — D84822 Immunocompromised state associated with stem cell transplant    (CMS-HCC): Principal | ICD-10-CM

## 2024-07-30 DIAGNOSIS — R059 Cough, unspecified type: Principal | ICD-10-CM

## 2024-07-30 DIAGNOSIS — C9100 Acute lymphoblastic leukemia not having achieved remission: Principal | ICD-10-CM

## 2024-07-30 DIAGNOSIS — R7989 Other specified abnormal findings of blood chemistry: Principal | ICD-10-CM

## 2024-07-30 DIAGNOSIS — E1165 Type 2 diabetes mellitus with hyperglycemia: Principal | ICD-10-CM

## 2024-07-30 DIAGNOSIS — Z9484 Stem cells transplant status: Principal | ICD-10-CM

## 2024-07-30 DIAGNOSIS — Z9481 Bone marrow transplant status: Principal | ICD-10-CM

## 2024-07-30 DIAGNOSIS — Z794 Long term (current) use of insulin: Principal | ICD-10-CM

## 2024-07-30 DIAGNOSIS — B348 Other viral infections of unspecified site: Principal | ICD-10-CM

## 2024-07-30 LAB — CBC W/ AUTO DIFF
BASOPHILS ABSOLUTE COUNT: 0.1 10*9/L (ref 0.0–0.1)
BASOPHILS RELATIVE PERCENT: 1.2 %
EOSINOPHILS ABSOLUTE COUNT: 0.8 10*9/L — ABNORMAL HIGH (ref 0.0–0.5)
EOSINOPHILS RELATIVE PERCENT: 12.7 %
HEMATOCRIT: 37.4 % (ref 34.0–44.0)
HEMOGLOBIN: 12.6 g/dL (ref 11.3–14.9)
LYMPHOCYTES ABSOLUTE COUNT: 1.4 10*9/L (ref 1.1–3.6)
LYMPHOCYTES RELATIVE PERCENT: 21.3 %
MEAN CORPUSCULAR HEMOGLOBIN CONC: 33.6 g/dL (ref 32.0–36.0)
MEAN CORPUSCULAR HEMOGLOBIN: 32.4 pg (ref 25.9–32.4)
MEAN CORPUSCULAR VOLUME: 96.4 fL — ABNORMAL HIGH (ref 77.6–95.7)
MEAN PLATELET VOLUME: 10 fL (ref 6.8–10.7)
MONOCYTES ABSOLUTE COUNT: 0.6 10*9/L (ref 0.3–0.8)
MONOCYTES RELATIVE PERCENT: 8.4 %
NEUTROPHILS ABSOLUTE COUNT: 3.7 10*9/L (ref 1.8–7.8)
NEUTROPHILS RELATIVE PERCENT: 56.4 %
PLATELET COUNT: 173 10*9/L (ref 150–450)
RED BLOOD CELL COUNT: 3.88 10*12/L — ABNORMAL LOW (ref 3.95–5.13)
RED CELL DISTRIBUTION WIDTH: 16.2 % — ABNORMAL HIGH (ref 12.2–15.2)
WBC ADJUSTED: 6.6 10*9/L (ref 3.6–11.2)

## 2024-07-30 LAB — COMPREHENSIVE METABOLIC PANEL
ALBUMIN: 3.8 g/dL (ref 3.4–5.0)
ALKALINE PHOSPHATASE: 80 U/L (ref 46–116)
ALT (SGPT): 77 U/L — ABNORMAL HIGH (ref 10–49)
ANION GAP: 14 mmol/L (ref 5–14)
AST (SGOT): 46 U/L — ABNORMAL HIGH (ref <=34)
BILIRUBIN TOTAL: 0.3 mg/dL (ref 0.3–1.2)
BLOOD UREA NITROGEN: 11 mg/dL (ref 9–23)
BUN / CREAT RATIO: 16
CALCIUM: 9.6 mg/dL (ref 8.7–10.4)
CHLORIDE: 104 mmol/L (ref 98–107)
CO2: 24 mmol/L (ref 20.0–31.0)
CREATININE: 0.68 mg/dL (ref 0.55–1.02)
EGFR CKD-EPI (2021) FEMALE: >90 mL/min/1.73m2 (ref >=60)
GLUCOSE RANDOM: 272 mg/dL — ABNORMAL HIGH (ref 70–179)
POTASSIUM: 4.4 mmol/L (ref 3.4–4.8)
PROTEIN TOTAL: 6.5 g/dL (ref 5.7–8.2)
SODIUM: 142 mmol/L (ref 135–145)

## 2024-07-30 LAB — CMV DNA, QUANTITATIVE, PCR: CMV VIRAL LD: NOT DETECTED

## 2024-07-30 LAB — MAGNESIUM: MAGNESIUM: 1.6 mg/dL (ref 1.6–2.6)

## 2024-07-30 LAB — EBV QUANTITATIVE PCR, BLOOD
EBV QUANT: <35 [IU]/mL — ABNORMAL HIGH (ref <0)
EBV VIRAL LOAD RESULT: DETECTED — AB

## 2024-07-30 LAB — IGG: GAMMAGLOBULIN; IGG: 769 mg/dL (ref 650–1600)

## 2024-07-30 NOTE — Patient Instructions (Signed)
 VISIT SUMMARY:  You had a follow-up visit today to check on your recovery after your bone marrow transplant and to manage your ongoing issues with parainfluenza viral pneumonia and immunodeficiency.    YOUR PLAN:  [place] CHROMOSOME POSITIVE ACUTE LYMPHOBLASTIC LEUKEMIA, POST ALLOGENEIC BONE MARROW TRANSPLANT: You are 48 days post-transplant with stable cell counts and excellent donor chimerism. There is a slight increase in eosinophils, which could indicate inflammation or a risk of graft-versus-host disease (GVHD), but you are currently asymptomatic.  -Continue taking tacrolimus  as prescribed. We will plan to taper it after day 180 if your condition remains stable.  -Continue taking dapsone  and fluconazole  as prescribed.  -Continue receiving IVIG infusions according to the clinic schedule.  -Your next chimerism test is scheduled for day 60.  -A bone marrow biopsy is planned for day 90.  -Monitor for any signs of GVHD, especially with your elevated eosinophils.  -Use sun protection and follow infection prevention guidelines.  -Live vaccines will be deferred until 2 years post-transplant.  -Driving and returning home will be considered after day 100, pending clearance from your primary transplant physician.    PARAINFLUENZA VIRAL RESPIRATORY INFECTION IN IMMUNOCOMPROMISED HOST: You are recovering from parainfluenza with a residual cough and mild hoarseness. You are stable but at increased risk for complications due to your immunocompromised status.  -A chest x-ray and nasal respiratory swab are scheduled for your visit on Friday.  -Continue using albuterol  as needed for symptom relief.    IMMUNODEFICIENCY AND INFECTION PROPHYLAXIS: You have immunodeficiency due to your bone marrow transplant, and you have resumed taking fluconazole  and dapsone  after a temporary hold.  -Continue taking fluconazole  and dapsone  as prescribed.

## 2024-07-31 DIAGNOSIS — C9100 Acute lymphoblastic leukemia not having achieved remission: Principal | ICD-10-CM

## 2024-07-31 LAB — TACROLIMUS LEVEL: TACROLIMUS BLOOD: 6.7 ng/mL

## 2024-08-01 DIAGNOSIS — C9100 Acute lymphoblastic leukemia not having achieved remission: Principal | ICD-10-CM

## 2024-08-01 NOTE — Progress Notes (Signed)
 BMT Clinic Follow-up    [name]  MRN: [id number]  Encounter date: 08/02/2024    Referring Physician: Estelle Toribio SAUNDERS, MD  BMT Attending MD: Dr. Jeaneen    Transplant Day: 29     Reason for Visit: Post allogeneic transplant follow up. This visit included intensive monitoring of high-risk medications/disease states given risk of severe myelosuppression, immunosuppression, infection, and other toxicities. Review included CBC, electrolytes and other blood tests, as well as direct assessment of the patient for symptoms suggesting toxicity.    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 Her transplant course was complicated by T2DM with steroid induced hyperglycemia (stable on current regimen at discharge), prolonged Qtc on admission (resolved with last Qtc 460 from 11/20), non-infectious diarrhea (resolved without recent need for imodium ). 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 Received most recent dose of IVIG on 11/26.     Interval History  Patricia [name] is a 49 y.o. 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 Patricia [name] is now s/p a matched unrelated donor stem cell transplant      Last week Patricia [name] was diagnosed with parainfluenza and enrolled on PUL-042.  She received received her first nebulizer treatment on 07/19/2024 and overnight developed fever and vomiting.  She withdrew from the trial on Monday, 07/22/2024 when she was seen in clinic.    - Recovering from parainfluenza URI  - Persistent but improving cough, post-nasal drip, and mild hoarseness  - No new or worsening respiratory symptoms  - No fever, chills, or sweats  - Albuterol  provides symptomatic relief a few times a day helps with coughing  - Afrin prn working on weaning off this  - Eating and driking ok  - No nausea issues   - BM loose at least once a day, but no diarrhea  - No skin rashes, has some dry skin that is generally itchy          ID: Valtrex , Fluconazole , Dapsone . letermovir     CXR today Friday 1/2 for study and study specific nasal swab. She is completing study tasks, but withdrew from the study medication treatment.   Seen by study coordinator.   Clear lungs on CXR today.       Physical Exam:  BP 112/74  - Pulse 101  - Temp 36.7 ??C (98 ??F) (Temporal)  - Resp 16  - Wt 73.4 kg (161 lb 13.1 oz)  - SpO2 97%  - BMI 26.12 kg/m??     70, Cares for self; unable to carry on normal activity or to do active work (ECOG equivalent 1)    General: No acute distress noted.   Central venous access: Port in place. Line clean, dry, intact. No erythema or drainage noted.   ENT: Moist mucous membranes. Oropharhynx without lesions, erythema or exudate.   Cardiovascular: Pulse normal rate, regularity and rhythm. S1 and S2 normal, without any murmur, rub, or gallop.  Lungs: Clear Good air movement.   Skin: Warm, dry, intact. No rash noted.    Psychiatry: Alert and oriented to person, place, and time.   Gastrointestinal/Abdomen: Normoactive bowel sounds, abdomen soft, non-tender   Musculoskeletal/Extremities: FROM throughout. No edema  Neurologic: CNII-XII intact. Normal strength and sensation throughout             Acute GVHD Assessment          Most Recent Value    07/05/2024 - 08/02/2024 Allogeneic BMT Unknown Phase -  Planned 06/12/2024-Post 1 Year    07/05/2024 18:18 Allogeneic BMT Unknown Phase - Planned 06/12/2024-Post 1 Year    07/30/2024 13:23 Allogeneic BMT Unknown Phase - Planned 06/12/2024-Post 1 Year    08/02/2024 17:17   Acute GVHD Grade and Severity   Overall Grade (Przepiorka) 0  08/02/2024 0 0 0       Assessment/Plan:  1) SCT Summary:  - Type of Transplant: RIC MUD Allo (Fludarabine  40 mg/m2/Melphalan  100  mg/m2)  - Graft Source: Fresh PBSCs10/10, Female, ABO O-, CMV negative     2) Disease: ALL  - Pre BMT disease status: CR MRD Negative  - Signs/symptoms of disease relapse: None   - Restaging plan:    *Next BMBX: Day +90   *Next Chimerism: Day +60 (ordered for 1/9)    Date Source Unfractionated CD3   07/12/24  PB >95% 93   08/09/24            3) Immunosuppressed: High risk of infections, if patient develops a fever will admit to inpatient unit for IV antibiotics and fever workup   - Prophylaxis:          * Antiviral: Valtrex  500 mg po BID through 2 years post transplant.   Letermovir  480 mg po daily through day +200 to prevent CMV infections   * Antifungal: Fluconazole  400 mg po daily through day +75  * PJP: Sulfa allergic. Dapsone  daily upon platelet engraftment, has tolerated this well in the past. Toxo PCR negative. Start upon platelet engraftment.     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   Previously required Valcyte  treatment. 11/16 CMV viral load undetectable.  - Continue Letermovir  and Valtrex  BID through day +200 post SCT.     Recurrent Acute Sinusitis  Recurrent acute sinusitis with nasal congestion, frontal headache, and low-grade fever. Responded well to Augmentin .  -11/7 current sx resolving, started Augmentin  10 days (10/30-11/9)    Parainfluenza:  -12/16: congestion, cough, sinus pressure. RPP +parainfluenza. Consented to pulmotect study.   - 12/19: 1st nebulizer treatment on Pumotect study, plan for 2nd treatment 12/22 and 3rd treatment 12/23.   - 12/22: Developed fever and N/V after 1st nebulizer. Per patient intolerance to nebulizer she has declined further doses and would like to withdraw from the study. Given increased albuterol  used CXR obtained which was clear. Continue with symptomatic treatment. Starting Agumentin x 10 days for possible secondary sinusitis. Sent screening cultures today due to fever of 101 over the weekend (likely secondary to Paraflu but will rule out bacteremia).   - 12/24: Cough is worsening today so we repeated a chest x-ray, which was clear.  Patricia [name] is not eligible for any further treatment on PUL-042.  Patricia [name] has been feeling better with intermittent albuterol  nebulizers so I prescribed additional nebulizer vials.  07/30/24: Parainfluenza viral respiratory infection in immunocompromised host  Recovering from parainfluenza with residual cough and mild dysphonia. Stable but at increased risk for complications due to immunocompromised status.  - Ordered chest x-ray and nasal respiratory swab for Friday visit.  - Continued albuterol  as needed.     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   Prophylaxis:  - Methotrexate  5 mg/m2 IVP on days +1, +3, +6 and +11  - Tacrolimus      * Goal trough: 5-10     * High risk drug monitoring and dose adjustments per BMT Pharmacist    * Signs/symptoms of toxicity: None      07/30/24:- Monitored for GVHD signs, especially with elevated eosinophils.    5) Heme  Transfusion criteria: Transfuse 1 unit of PRBCs for hemoglobin <7 and 1 unit of platelets for Plt <10K or bleeding. [name] Warshaw does not have a history of transfusion reactions. Consent was obtained and documented on 06/05/24.     Anemia: Now normalized.     Thrombocytopenia: Secondary to chemotherapy. Slowly improving.     Leukopenia: WBC/ANC stable.     6) GI/Liver:   GERD prophylaxis: Pepcid  BID    Nausea:PRN zofran  and compazine     Diarrhea: PRN Imodium , has not needed recently     Elevated liver enzymes:  - Started trending up on 12/5 with mildly elevated AST  - On 12/9 both AST and ALT elevated- Dapsone  and Pepcid  held  - 12/12: AST 112 and ALT 172 normal bili. Will hold Fluconazole  until next visit to re-evaluate. Sending adeno and HHV-6. CMV and EBV have been negative.   - 12/16: AST improved, ALT stable. Like up from URI (tested positive for parainfluenza today). Possibly secondary medication (metformin , fluc, dapsone , and pepcid ). Fluc, dapsone , and pepcid  on hold. If LFts stable/improved next visit would restart fluconazole .   - 12/19: AST/ALT worsened today, likely secondary to parainfluenza. Repeat labs on Monday if LFTs stable/imrpoved plan to re-challenge fluc, if worsen will need to setup IV micafungin.   - 12/22: AST/ALT improving thus likely secondary to URI. Will rechallenge with fluconazole .   - 12/24: AST/ALT continue to improve.  I suspect this is resolution after URI.  Can resume dapsone .     7) Hypomagnesemia:   Mag chelate 2 tabs TID    8)  Heart failure with mildly reduced ejection fraction (HFmrEF)   Followed by [facility]:  Due to anythracycline use  Hx of EF 45-50%, most recent Echo 05/23/24 improved to 55-60%  Hx of prolonged QTc  - Home med Losartan  50 mg daily (held 11/18)      BP Readings from Last 3 Encounters:   08/02/24 112/74   07/30/24 118/85   07/24/24 122/84      9) Pulm/Allergies   - cetirizine  10 mg daily   - albuterol  PRN     10) Menses suppression: Lupron  last given 07/26/22. No menses sense (peri-menopausal/menopausal)  - 12/19: screening urine pregnancy test per study resulted as positive, repeat urine test indeterminate. Serum beta HcG 5.9. Low level Hcg can often be seen in peri-menopausal women who are not pregnant. She has had chemotherapy and only 1 month post transplant so would consider this a negative pregnancy screen.      11) Type 2 DM exacerbated by steroids: Followed by [facility]. At home uses Dexcom for monitoring. Has follow up with Endocrinology on 07/05/24.   - Increase insulin  glargine from 12u to 15u once daily  - Continue insulin  aspart with meals per carb counting (1 unit for every 8 grams of carbs)  - Endocrine added Metformin  500 mg XR daily on 07/05/24. Next follow up 08/28/23  12) Pain:  - PRN tramadol  50 mg. No oxy as this give patient migraines     13) Anxiety:  Lexapro  10 mg daily (has PRN ativan  available but has not needed)  Reconnected with her local therapist      14) DispoCaregivers: Lives in [place], [place]  Primary caregiver: Jeanie Tate (mom)- first 30 days at [facility] then home if doing well will stay longer if medically needed  Secondary caregiver: Faviola Klare (husband)    15) Bone Health: DEXA 07/09/24: Results with normal bone density      I personally spent 40 minutes face-to-face and non-face-to-face in the care of this patient, which includes all pre, intra, and post visit time on the date of service.  All documented time was specific to the E/M visit and does not include any procedures that may have been performed.  Time was spent on clinical assessment, placing orders / referrals, and/or prescription management, all pertaining to the diagnoses listed above.       Rosina MARLA Morse, ANP  [facility]      Future Appointments   Date Time Provider Department Center   08/06/2024 12:00 PM ONCBMT LABS [facility]   08/06/2024  1:00 PM ONCBMT APP A [facility]   08/09/2024 12:00 PM ONCBMT LABS [facility]   08/09/2024  1:00 PM ONCBMT APP A [facility]   08/13/2024  9:30 AM ONCBMT LABS [facility]   08/13/2024 10:30 AM [name], MD [facility]   08/13/2024  1:25 PM Delores Fleeting, MD UNCHRTVASET [facility]   08/16/2024 11:00 AM ONCBMT LABS [facility]   08/16/2024 11:15 AM ONCBMT NURSE [facility]   08/16/2024 11:45 AM ONCBMT COORDINATOR [facility]   08/16/2024 12:00 PM ONCBMT APP A [facility]   08/27/2024  9:50 AM Kome, Arlean HERO, CPP UNCDIABENDET [facility]   09/05/2024 10:00 AM Jarold Motto T [facility]CDIABENDET [facility]   09/10/2024  9:00 AM ONCBMT LABS [facility]   09/10/2024 10:00 AM ONCBMT APP A [facility]   09/10/2024 11:00 AM Polly Alyce Dover, MD ONCMULTI [facility] 09/10/2024 12:00 PM ONCBMT PROCEDURES [id number] [facility]   09/17/2024  9:15 AM ONCBMT LABS [facility]   09/17/2024 10:00 AM [name], MD [facility]   09/17/2024 11:00 AM CARSER EKG [id number] [facility]   09/24/2024  8:15 AM ONCBMT LABS [facility]   09/24/2024  9:00 AM ONCBMT APP A [facility]   09/24/2024 10:00 AM ONCBMT PHARMACY [facility]   09/24/2024 10:30 AM ONCBMT COORDINATOR [facility]   12/10/2024  9:30 AM ONCBMT LABS [facility]   12/10/2024 10:30 AM ONCBMT PHARMACY [facility]   12/27/2024  9:40 AM Storm Honer, MD UNCDIABENDET [facility]

## 2024-08-01 NOTE — Patient Instructions (Signed)
 A pharmacist will call you if I need to make changes to your tacrolimus  dose.     -------------------------------------------------------------------------------  Return to clinic on Tuesday to see one of the providers. You will receive a time when you check out today.    Lab Results   Component Value Date    WBC 6.6 07/30/2024    HGB 12.6 07/30/2024    HCT 37.4 07/30/2024    PLT 173 07/30/2024     Lab Results   Component Value Date    NA 142 07/30/2024    K 4.4 07/30/2024    CL 104 07/30/2024    CO2 24.0 07/30/2024    BUN 11 07/30/2024    CREATININE 0.68 07/30/2024    GLU 272 (H) 07/30/2024    CALCIUM  9.6 07/30/2024    MG 1.6 07/30/2024    PHOS 4.5 06/26/2024     Lab Results   Component Value Date    BILITOT 0.3 07/30/2024    BILIDIR 0.10 06/26/2024    PROT 6.5 07/30/2024    ALBUMIN 3.8 07/30/2024    ALT 77 (H) 07/30/2024    AST 46 (H) 07/30/2024    ALKPHOS 80 07/30/2024    GGT 836 (H) 11/25/2023     Lab Results   Component Value Date    INR 1.15 06/26/2024    APTT 29.0 06/26/2024       For prescription refills:   For refills, please check your medication bottles to see if you have additional refills left. If so, please call your pharmacy and follow the directions to request a refill. If you do not have any refills left, please make a request during your clinic visit or by submitting a request through MyChart or by calling [phone]. Please allow 24 hours if your request is made during the week or 48 hours if requests are made on the weekends or holidays.     --------------------------------------------------------------------------------------------------------------------  For appointments & questions Monday through Friday 8 AM-4:30 PM     Please call [phone]     On Nights, Weekends and [place] [phone]. This is the nurse's station. They will contact the provider for you.     Please visit Privacyfever.cz, a resource created just for family members and caregivers.  This website lists support services, how and where to ask for help. It has tools to assist you as you help us  care for your loved one.    [employer]  [address]  [place], [place] [address]  [email or web address]

## 2024-08-02 ENCOUNTER — Ambulatory Visit
Admit: 2024-08-02 | Discharge: 2024-08-02 | Payer: PRIVATE HEALTH INSURANCE | Attending: Primary Care | Primary: Primary Care

## 2024-08-02 ENCOUNTER — Other Ambulatory Visit: Admit: 2024-08-02 | Discharge: 2024-08-02 | Payer: PRIVATE HEALTH INSURANCE

## 2024-08-02 ENCOUNTER — Ambulatory Visit: Admit: 2024-08-02 | Discharge: 2024-08-02 | Payer: PRIVATE HEALTH INSURANCE

## 2024-08-02 ENCOUNTER — Inpatient Hospital Stay: Admit: 2024-08-02 | Discharge: 2024-08-02 | Payer: PRIVATE HEALTH INSURANCE

## 2024-08-02 ENCOUNTER — Encounter: Admit: 2024-08-02 | Discharge: 2024-08-02 | Payer: PRIVATE HEALTH INSURANCE

## 2024-08-02 DIAGNOSIS — D721 Eosinophilia, unspecified type: Principal | ICD-10-CM

## 2024-08-02 DIAGNOSIS — Z9484 Stem cells transplant status: Principal | ICD-10-CM

## 2024-08-02 DIAGNOSIS — Z5181 Encounter for therapeutic drug level monitoring: Principal | ICD-10-CM

## 2024-08-02 DIAGNOSIS — C9100 Acute lymphoblastic leukemia not having achieved remission: Principal | ICD-10-CM

## 2024-08-02 DIAGNOSIS — Z9481 Bone marrow transplant status: Principal | ICD-10-CM

## 2024-08-02 DIAGNOSIS — Z794 Long term (current) use of insulin: Principal | ICD-10-CM

## 2024-08-02 DIAGNOSIS — D84822 Immunocompromised state associated with stem cell transplant    (CMS-HCC): Principal | ICD-10-CM

## 2024-08-02 DIAGNOSIS — Z9189 Other specified personal risk factors, not elsewhere classified: Principal | ICD-10-CM

## 2024-08-02 DIAGNOSIS — R059 Cough, unspecified type: Principal | ICD-10-CM

## 2024-08-02 DIAGNOSIS — R7989 Other specified abnormal findings of blood chemistry: Principal | ICD-10-CM

## 2024-08-02 DIAGNOSIS — E1165 Type 2 diabetes mellitus with hyperglycemia: Principal | ICD-10-CM

## 2024-08-02 DIAGNOSIS — B348 Other viral infections of unspecified site: Principal | ICD-10-CM

## 2024-08-02 LAB — COMPREHENSIVE METABOLIC PANEL
ALBUMIN: 3.8 g/dL (ref 3.4–5.0)
ALKALINE PHOSPHATASE: 75 U/L (ref 46–116)
ALT (SGPT): 84 U/L — ABNORMAL HIGH (ref 10–49)
ANION GAP: 9 mmol/L (ref 5–14)
AST (SGOT): 51 U/L — ABNORMAL HIGH (ref <=34)
BILIRUBIN TOTAL: 0.2 mg/dL — ABNORMAL LOW (ref 0.3–1.2)
BLOOD UREA NITROGEN: 15 mg/dL (ref 9–23)
BUN / CREAT RATIO: 22
CALCIUM: 10 mg/dL (ref 8.7–10.4)
CHLORIDE: 105 mmol/L (ref 98–107)
CO2: 30 mmol/L (ref 20.0–31.0)
CREATININE: 0.67 mg/dL (ref 0.55–1.02)
EGFR CKD-EPI (2021) FEMALE: >90 mL/min/1.73m2 (ref >=60)
GLUCOSE RANDOM: 162 mg/dL (ref 70–179)
POTASSIUM: 4.5 mmol/L (ref 3.5–5.1)
PROTEIN TOTAL: 6.3 g/dL (ref 5.7–8.2)
SODIUM: 144 mmol/L (ref 135–145)

## 2024-08-02 LAB — CBC W/ AUTO DIFF
BASOPHILS ABSOLUTE COUNT: 0.1 10*9/L (ref 0.0–0.1)
BASOPHILS RELATIVE PERCENT: 0.7 %
EOSINOPHILS ABSOLUTE COUNT: 0.8 10*9/L — ABNORMAL HIGH (ref 0.0–0.5)
EOSINOPHILS RELATIVE PERCENT: 11 %
HEMATOCRIT: 37.7 % (ref 34.0–44.0)
HEMOGLOBIN: 12.6 g/dL (ref 11.3–14.9)
LYMPHOCYTES ABSOLUTE COUNT: 1.6 10*9/L (ref 1.1–3.6)
LYMPHOCYTES RELATIVE PERCENT: 23.2 %
MEAN CORPUSCULAR HEMOGLOBIN CONC: 33.4 g/dL (ref 32.0–36.0)
MEAN CORPUSCULAR HEMOGLOBIN: 32.2 pg (ref 25.9–32.4)
MEAN CORPUSCULAR VOLUME: 96.3 fL — ABNORMAL HIGH (ref 77.6–95.7)
MEAN PLATELET VOLUME: 10.4 fL (ref 6.8–10.7)
MONOCYTES ABSOLUTE COUNT: 0.4 10*9/L (ref 0.3–0.8)
MONOCYTES RELATIVE PERCENT: 6.5 %
NEUTROPHILS ABSOLUTE COUNT: 4 10*9/L (ref 1.8–7.8)
NEUTROPHILS RELATIVE PERCENT: 58.6 %
PLATELET COUNT: 164 10*9/L (ref 150–450)
RED BLOOD CELL COUNT: 3.91 10*12/L — ABNORMAL LOW (ref 3.95–5.13)
RED CELL DISTRIBUTION WIDTH: 16 % — ABNORMAL HIGH (ref 12.2–15.2)
WBC ADJUSTED: 6.9 10*9/L (ref 3.6–11.2)

## 2024-08-02 LAB — MAGNESIUM: MAGNESIUM: 1.6 mg/dL (ref 1.6–2.6)

## 2024-08-02 LAB — EBV QUANTITATIVE PCR, BLOOD: EBV VIRAL LOAD RESULT: NOT DETECTED

## 2024-08-02 MED ORDER — MAGNESIUM OXIDE-MAGNESIUM AMINO ACID CHELATE 133 MG TABLET
ORAL_TABLET | 2 refills | 0.00000 days | Status: CP
Start: 2024-08-02 — End: ?
  Filled 2024-08-06: qty 100, 16d supply, fill #0

## 2024-08-03 DIAGNOSIS — C9100 Acute lymphoblastic leukemia not having achieved remission: Principal | ICD-10-CM

## 2024-08-03 LAB — TACROLIMUS LEVEL, TROUGH: TACROLIMUS, TROUGH: 7.2 ng/mL (ref 5.0–15.0)

## 2024-08-05 NOTE — Progress Notes (Signed)
 Outpatient BMT SW Note: SW received an email from [employer] stating:    Application Approved  We are pleased to inform you that your Post-Transplant Support Assistance application for the patient below has been approved!   Patient: Patricia [name]  Payee: Patricia [name]  Payment method: Card  Please note:   Changing the payment method after grant approval will result in a delay in payment processing.   Address funds are being sent to:  Patient address: [address] [place] [address]  or   Alternate mailing address:   Fund Amount   [employer] Patient Financial Assistance  $1,000.00    [employer] sends funding to the chosen payee every Friday. Funding is typically received within ten business days from the date of this approval. If your approval is received after Wednesday at 1:30pm CT, the payment will be sent the following Friday.  Please let us  know if you have any questions or concerns.    SW called pt to follow up. Pt was not available at time of call. SW left a message to make sure she was aware of grant approval.

## 2024-08-06 ENCOUNTER — Other Ambulatory Visit: Admit: 2024-08-06 | Discharge: 2024-08-06 | Payer: PRIVATE HEALTH INSURANCE

## 2024-08-06 ENCOUNTER — Ambulatory Visit
Admit: 2024-08-06 | Discharge: 2024-08-06 | Payer: PRIVATE HEALTH INSURANCE | Attending: Nurse Practitioner | Primary: Nurse Practitioner

## 2024-08-06 DIAGNOSIS — Z9481 Bone marrow transplant status: Principal | ICD-10-CM

## 2024-08-06 DIAGNOSIS — E1165 Type 2 diabetes mellitus with hyperglycemia: Principal | ICD-10-CM

## 2024-08-06 DIAGNOSIS — D84822 Immunocompromised state associated with stem cell transplant    (CMS-HCC): Principal | ICD-10-CM

## 2024-08-06 DIAGNOSIS — C9100 Acute lymphoblastic leukemia not having achieved remission: Principal | ICD-10-CM

## 2024-08-06 DIAGNOSIS — R7989 Other specified abnormal findings of blood chemistry: Principal | ICD-10-CM

## 2024-08-06 DIAGNOSIS — Z794 Long term (current) use of insulin: Principal | ICD-10-CM

## 2024-08-06 DIAGNOSIS — Z5181 Encounter for therapeutic drug level monitoring: Principal | ICD-10-CM

## 2024-08-06 DIAGNOSIS — Z9484 Stem cells transplant status: Principal | ICD-10-CM

## 2024-08-06 LAB — CBC W/ AUTO DIFF
BASOPHILS ABSOLUTE COUNT: 0.1 10*9/L (ref 0.0–0.1)
BASOPHILS RELATIVE PERCENT: 2 %
EOSINOPHILS ABSOLUTE COUNT: 0.5 10*9/L (ref 0.0–0.5)
EOSINOPHILS RELATIVE PERCENT: 8 %
HEMATOCRIT: 38.8 % (ref 34.0–44.0)
HEMOGLOBIN: 12.8 g/dL (ref 11.3–14.9)
LYMPHOCYTES ABSOLUTE COUNT: 2 10*9/L (ref 1.1–3.6)
LYMPHOCYTES RELATIVE PERCENT: 29.8 %
MEAN CORPUSCULAR HEMOGLOBIN CONC: 32.9 g/dL (ref 32.0–36.0)
MEAN CORPUSCULAR HEMOGLOBIN: 31.9 pg (ref 25.9–32.4)
MEAN CORPUSCULAR VOLUME: 97 fL — ABNORMAL HIGH (ref 77.6–95.7)
MEAN PLATELET VOLUME: 10.2 fL (ref 6.8–10.7)
MONOCYTES ABSOLUTE COUNT: 0.5 10*9/L (ref 0.3–0.8)
MONOCYTES RELATIVE PERCENT: 8.3 %
NEUTROPHILS ABSOLUTE COUNT: 3.4 10*9/L (ref 1.8–7.8)
NEUTROPHILS RELATIVE PERCENT: 51.9 %
PLATELET COUNT: 149 10*9/L — ABNORMAL LOW (ref 150–450)
RED BLOOD CELL COUNT: 4 10*12/L (ref 3.95–5.13)
RED CELL DISTRIBUTION WIDTH: 15.6 % — ABNORMAL HIGH (ref 12.2–15.2)
WBC ADJUSTED: 6.6 10*9/L (ref 3.6–11.2)

## 2024-08-06 LAB — COMPREHENSIVE METABOLIC PANEL
ALBUMIN: 4 g/dL (ref 3.4–5.0)
ALKALINE PHOSPHATASE: 78 U/L (ref 46–116)
ALT (SGPT): 71 U/L — ABNORMAL HIGH (ref 10–49)
ANION GAP: 15 mmol/L — ABNORMAL HIGH (ref 5–14)
AST (SGOT): 39 U/L — ABNORMAL HIGH (ref <=34)
BILIRUBIN TOTAL: 0.3 mg/dL (ref 0.3–1.2)
BLOOD UREA NITROGEN: 12 mg/dL (ref 9–23)
BUN / CREAT RATIO: 19
CALCIUM: 10 mg/dL (ref 8.7–10.4)
CHLORIDE: 104 mmol/L (ref 98–107)
CO2: 25 mmol/L (ref 20.0–31.0)
CREATININE: 0.64 mg/dL (ref 0.55–1.02)
EGFR CKD-EPI (2021) FEMALE: >90 mL/min/1.73m2 (ref >=60)
GLUCOSE RANDOM: 257 mg/dL — ABNORMAL HIGH (ref 70–179)
POTASSIUM: 3.9 mmol/L (ref 3.4–4.8)
PROTEIN TOTAL: 6.9 g/dL (ref 5.7–8.2)
SODIUM: 144 mmol/L (ref 135–145)

## 2024-08-06 LAB — MAGNESIUM: MAGNESIUM: 1.5 mg/dL — ABNORMAL LOW (ref 1.6–2.6)

## 2024-08-06 LAB — IGG: GAMMAGLOBULIN; IGG: 568 mg/dL — ABNORMAL LOW (ref 650–1600)

## 2024-08-06 LAB — EBV QUANTITATIVE PCR, BLOOD: EBV VIRAL LOAD RESULT: NOT DETECTED

## 2024-08-06 LAB — CMV DNA, QUANTITATIVE, PCR: CMV VIRAL LD: NOT DETECTED

## 2024-08-06 MED FILL — VALACYCLOVIR 500 MG TABLET: ORAL | 30 days supply | Qty: 60 | Fill #1

## 2024-08-06 NOTE — Progress Notes (Signed)
 BMT Clinic Follow-up    [name]  MRN: [id number]  Encounter date: 08/06/2024    Referring Physician: Estelle Toribio SAUNDERS, MD  BMT Attending MD: Dr. Jeaneen    Transplant Day: 67     Reason for Visit: Post allogeneic transplant follow up. This visit included intensive monitoring of high-risk medications/disease states given risk of severe myelosuppression, immunosuppression, infection, and other toxicities. Review included CBC, electrolytes and other blood tests, as well as direct assessment of the patient for symptoms suggesting toxicity.    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 Her transplant course was complicated by T2DM with steroid induced hyperglycemia (stable on current regimen at discharge), prolonged Qtc on admission (resolved with last Qtc 460 from 11/20), non-infectious diarrhea (resolved without recent need for imodium ). 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 Received most recent dose of IVIG on 11/26.     Interval History  Patricia [name] is a 49 y.o. 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 [facility]atum is now s/p a matched unrelated donor stem cell transplant      On 07/16/24, Patricia [name] was diagnosed with parainfluenza and enrolled on PUL-042.  She received received her first nebulizer treatment on 07/19/2024 and overnight developed fever and vomiting.  She withdrew from the trial on Monday, 07/22/2024 when she was seen in clinic.    - Recovering from parainfluenza URI  - Persistent but improving cough, post-nasal drip, and mild hoarseness  - No new or worsening respiratory symptoms  - No fever, chills, or sweats  - Albuterol  provides symptomatic relief a few times a day helps with coughing  - Afrin prn working on weaning off this  - Feels like ears are extra waxy but no pain or discomfort. Wax noted to right ear.  - Eating and drinking ok  - No nausea issues   - BM loose at least once a day, but no diarrhea  - No skin rashes, has some dry skin that is generally itchy  - Blood sugar average over the past 3 days was 170.      ID: Valtrex , Fluconazole , Dapsone . letermovir       Physical Exam:  BP 117/88  - Pulse 89  - Temp 36.9 ??C (98.4 ??F) (Oral)  - Resp 16  - Wt 73.4 kg (161 lb 13.1 oz)  - SpO2 98%  - BMI 26.12 kg/m??     70, Cares for self; unable to carry on normal activity or to do active work (ECOG equivalent 1)    General: No acute distress noted.   Central venous access: Port in place. Line clean, dry, intact. No erythema or drainage noted.   ENT: Moist mucous membranes. Oropharhynx without lesions, erythema or exudate. Wax noted to right ear.  Cardiovascular: Pulse normal rate, regularity and rhythm. S1 and S2 normal, without any murmur, rub, or gallop.  Lungs: Clear Good air movement.   Skin: Warm, dry, intact. No rash noted.    Psychiatry: Alert and oriented to person, place, and time.   Gastrointestinal/Abdomen: Normoactive bowel sounds, abdomen soft, non-tender   Musculoskeletal/Extremities: FROM throughout. No edema  Neurologic: CNII-XII intact. Normal strength and sensation throughout         Acute GVHD Assessment          Most Recent Value    07/30/2024 - 08/06/2024 Allogeneic BMT Unknown Phase - Planned 06/12/2024-Post 1 Year    07/30/2024 13:23 Allogeneic BMT Unknown Phase -  Planned 06/12/2024-Post 1 Year    08/02/2024 17:17   Acute GVHD Grade and Severity   Overall Grade (Przepiorka) 0  08/02/2024 0 0       Assessment/Plan:  1) SCT Summary:  - Type of Transplant: RIC MUD Allo (Fludarabine  40 mg/m2/Melphalan  100 mg/m2)  - Graft Source: Fresh PBSCs10/10, Female, ABO O-, CMV negative     2) Disease: ALL  - Pre BMT disease status: CR MRD Negative  - Signs/symptoms of disease relapse: None   - Restaging plan:    *Next BMBX: Day +90   *Next Chimerism: Day +60 (ordered for 1/9)    Date Source Unfractionated CD3   07/12/24  PB >95% 93   08/09/24            3) Immunosuppressed: High risk of infections, if patient develops a fever will admit to inpatient unit for IV antibiotics and fever workup   - Prophylaxis:          * Antiviral: Valtrex  500 mg po BID through 2 years post transplant.   Letermovir  480 mg po daily through day +200 to prevent CMV infections   * Antifungal: Fluconazole  400 mg po daily through day +75  * PJP: Sulfa allergic. Dapsone  daily upon platelet engraftment, has tolerated this well in the past. Toxo PCR negative.    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   Previously required Valcyte  treatment. 11/16 CMV viral load undetectable.  - Continue Letermovir  and Valtrex  BID through day +200 post SCT.     Recurrent Acute Sinusitis  Recurrent acute sinusitis with nasal congestion, frontal headache, and low-grade fever. Responded well to Augmentin .  -06/07/24 current sx resolving, started Augmentin  10 days (10/30-11/9)    Parainfluenza:  -12/16: congestion, cough, sinus pressure. RPP +parainfluenza. Consented to pulmotect study.   - 12/19: 1st nebulizer treatment on Pumotect study, plan for 2nd treatment 12/22 and 3rd treatment 12/23.   - 12/22: Developed fever and N/V after 1st nebulizer. Per patient intolerance to nebulizer she has declined further doses and would like to withdraw from the study. Given increased albuterol  used CXR obtained which was clear. Continue with symptomatic treatment. Starting Agumentin x 10 days for possible secondary sinusitis. Sent screening cultures today due to fever of 101 over the weekend (likely secondary to Paraflu but will rule out bacteremia).   - 12/24: Cough is worsening today so we repeated a chest x-ray, which was clear.  Patricia [name] is not eligible for any further treatment on PUL-042.  Patricia [name] has been feeling better with intermittent albuterol  nebulizers so I prescribed additional nebulizer vials.  07/30/24: Parainfluenza viral respiratory infection in immunocompromised host  Recovering from parainfluenza with residual cough and mild dysphonia. Stable but at increased risk for complications due to immunocompromised status.  - Ordered chest x-ray and nasal respiratory swab for Friday visit.  - Continued albuterol  as needed.   -08/06/24: Symptoms improving. Seen by study coordinator today.    Hypogammaglobulinemia   - Monthly IVIG (started 07/26/22 with CMV viremia and recurrent URI's) - last dose on 06/26/24   - Check monthly IgG levels   - IgG 769 on 12/30  - She was receiving this monthly prior to SCT, will check to see if we need to continue giving as ppx or can continue with monthly levels and giving prn if <300  -08/06/24: IgG is 568    4) GvHD: S/P  allogeneic stem cell transplant at risk of GvHD.   Prophylaxis:  - Methotrexate  5 mg/m2 IVP on days +1, +3, +6 and +11  - Tacrolimus      * Goal trough: 5-10     * High risk drug monitoring and dose adjustments per BMT Pharmacist    * Signs/symptoms of toxicity: None      07/30/24:- Monitored for GVHD signs, especially with elevated eosinophils.  -08/06/24: Eosinophilia resolved.    5) Heme  Transfusion criteria: Transfuse 1 unit of PRBCs for hemoglobin <7 and 1 unit of platelets for Plt <10K or bleeding. Patricia [name] does not have a history of transfusion reactions. Consent was obtained and documented on 06/05/24.     Anemia: Now normalized.     Thrombocytopenia: Secondary to chemotherapy. Slowly improving.     Leukopenia: WBC/ANC stable.     6) GI/Liver:   GERD prophylaxis: Pepcid  BID    Nausea:PRN zofran  and compazine     Diarrhea: PRN Imodium , has not needed recently     Elevated liver enzymes:  - Started trending up on 12/5 with mildly elevated AST  - On 12/9 both AST and ALT elevated- Dapsone  and Pepcid  held  - 12/12: AST 112 and ALT 172 normal bili. Will hold Fluconazole  until next visit to re-evaluate. Sending adeno and HHV-6. CMV and EBV have been negative.   - 12/16: AST improved, ALT stable. Like up from URI (tested positive for parainfluenza today). Possibly secondary medication (metformin , fluc, dapsone , and pepcid ). Fluc, dapsone , and pepcid  on hold. If LFts stable/improved next visit would restart fluconazole .   - 12/19: AST/ALT worsened today, likely secondary to parainfluenza. Repeat labs on Monday if LFTs stable/imrpoved plan to re-challenge fluc, if worsen will need to setup IV micafungin.   - 12/22: AST/ALT improving thus likely secondary to URI. Will rechallenge with fluconazole .   - 12/24: AST/ALT continue to improve.  I suspect this is resolution after URI.  Can resume dapsone .  -08/06/24: LFTs improved again today.      7) Hypomagnesemia:   Mag chelate 2 tabs TID    8)  Heart failure with mildly reduced ejection fraction (HFmrEF)   Followed by [facility]:  Due to anythracycline use  Hx of EF 45-50%, most recent Echo 05/23/24 improved to 55-60%  Hx of prolonged QTc  - Home med Losartan  50 mg daily (held 11/18)      BP Readings from Last 3 Encounters:   08/06/24 117/88   08/02/24 112/74   07/30/24 118/85      9) Pulm/Allergies   - cetirizine  10 mg daily   - albuterol  PRN     10) Menses suppression: Lupron  last given 07/26/22. No menses sense (peri-menopausal/menopausal)  - 12/19: screening urine pregnancy test per study resulted as positive, repeat urine test indeterminate. Serum beta HcG 5.9. Low level Hcg can often be seen in peri-menopausal women who are not pregnant. She has had chemotherapy and only 1 month post transplant so would consider this a negative pregnancy screen.      11) Type 2 DM exacerbated by steroids: Followed by [facility]. At home uses Dexcom for monitoring. Has follow up with Endocrinology on 07/05/24.   - Increase insulin  glargine from 12u to 15u once daily  - Continue insulin  aspart with meals per carb counting (1 unit for every 8 grams of carbs)  - Endocrine added Metformin  500 mg XR daily on 07/05/24. Next follow up 08/28/23    12) Pain:  - PRN tramadol  50  mg. No oxy as this give patient migraines     13) Anxiety:  Lexapro  10 mg daily (has PRN ativan  available but has not needed)  Reconnected with her local therapist      14) DispoCaregivers: Lives in [place], [place]  Primary caregiver: Patricia [name] (mom)  Secondary caregiver: Patricia [name] (husband)    15) Bone Health: DEXA 07/09/24: Results with normal bone density      Patricia [name], ANP  [facility]      Future Appointments   Date Time Provider Department Center   08/09/2024 12:00 PM ONCBMT LABS [facility]   08/09/2024  1:00 PM ONCBMT APP A [facility]   08/13/2024  9:30 AM ONCBMT LABS [facility]   08/13/2024 10:30 AM [name], MD [facility]   08/13/2024  1:25 PM Delores Fleeting, MD UNCHRTVASET [facility]   08/16/2024 11:00 AM ONCBMT LABS [facility]   08/16/2024 11:15 AM ONCBMT NURSE [facility]   08/16/2024 11:45 AM ONCBMT COORDINATOR [facility]   08/16/2024 12:00 PM ONCBMT APP A [facility]   08/27/2024  9:50 AM Kome, Arlean HERO, CPP UNCDIABENDET [facility]   09/05/2024 10:00 AM Jarold Motto T UNCDIABENDET [facility]   09/10/2024  9:00 AM ONCBMT LABS [facility]   09/10/2024 10:00 AM ONCBMT APP A [facility]   09/10/2024 11:00 AM Polly Alyce Dover, MD ONCMULTI [facility]   09/10/2024 12:00 PM ONCBMT PROCEDURES [id number] [facility]   09/17/2024  9:15 AM ONCBMT LABS [facility]   09/17/2024 10:00 AM [name], MD [facility]   09/17/2024 11:00 AM CARSER EKG [id number] [facility]   09/24/2024  8:15 AM ONCBMT LABS [facility]   09/24/2024  9:00 AM ONCBMT APP A [facility]   09/24/2024 10:00 AM ONCBMT PHARMACY [facility]   09/24/2024 10:30 AM ONCBMT COORDINATOR [facility]   12/10/2024  9:30 AM ONCBMT LABS [facility]   12/10/2024 10:30 AM ONCBMT PHARMACY [facility]   12/27/2024  9:40 AM Storm Honer, MD UNCDIABENDET [facility]

## 2024-08-06 NOTE — Progress Notes (Signed)
 DIVISION OF CARDIOLOGY  [facility] , [place] [place]        Date of Service: 08/13/2024    PCP: Referring Provider:   Estelle Toribio SAUNDERS, MD  [address]  [place] [address]  Phone: [phone]  Fax: [phone] [name], MD  [address]  [facility] [address] [facility]  [place],  [place] [address]  Phone: [phone]  Fax: [phone]     Assessment and Plan:     Patricia [name] is a 49 y.o. female with HFmrEF, B-ALL (dx 2023), HTN, T2DM 2/2 steroid induced hyperglycemia, anxiety who presents for f/u.    HFmrEF 2/2 cancer therapy related cardiac dysfunction  Initially referred to cardiology for evaluation prior to stem cell transplant as well as history of mildly reduced LVEF, 45-50%, likely in the setting of cancer therapy related cardiac dysfunction with history of B-ALL and chemotherapy. Had normal LVEF prior to chemo in 2023. She is now s/p allogenic stem cell transplant and currently maintained on tacrolimus . Repeat TTE in October demonstrated normal LV EF. She is not currently on cardiotoxic cancer therapy and has no signs or symptoms of heart failure. Will repeat TTE at next visit and then can monitor based on symptoms.   - repeat TTE at follow up visit     HTN  History of hypertension previously on ponatinib , which may have worsened her hypertension. Now off ponatinib  and losartan  and BP normal. 124/93 today, continue to monitor in the outpatient setting.   - now off losartan   - continue to monitor BP at home and in clinic     Prolonged QTc  Prolonged QTc.  ECG today with QTc of 529, slightly longer than previous ECG. Likely contributed by lexapro , additionally has been taking Mg supplement for chronic hypomagnesemia. Not having to take much PRN zofran  or tramadol .   - avoid QT prolonging meds  - close monitoring of QTc with new medications  Assessment & Plan     The patient was seen and discussed with Dr. Fernande.    Return in about 5 months (around 01/11/2025).      Subjective:      Reason for Visit: follow up    History of Present Illness: Patricia [name] is a 49 y.o. female with history of HFmrEF, B-ALL (dx 2023), HTN, T2DM 2/2 steroid induced hyperglycemia, anxiety, who presents for follow up.    Brief Summary:  Initially seen in clinic in September 2025 for cardiac evaluation prior to allogenic stem cell transplant. She had history of HTN but no prior CV disease. TTE prior to initiation of chemotherapy in 2023 with normal LVEF. TTE with cancer treatment showed mildly reduced LVEF 45-50%.     Interval History:    Last seen in clinic in September 2025 at which time she felt well. Repeat TTE in September 2025 showed stable LV EF 45-50%, GLS -10.0%, normal RV size and function. She had another TTE in October which showed LV EF 55-60%. Since her last visit, she underwent allogenic stem cell transplant.  She reports doing well since that time.  She has no symptoms of congestive heart failure including dyspnea, orthopnea, lower extremity edema.  She is tolerating her activity well and tries to remain active as she can.  No exertional chest pain or pressure.  Was taken off her losartan  and reports her blood pressure has been normal.    History of Present Illness        Cardiovascular History:  Cardiovascular Problem List:  HTN  HFmrEF  Prolonged QTc     Cardiovascular Studies:  ECG (notable and most recent):  08/13/2024: NSR, prolonged Qtc 529 (personally reviewed)  04/16/2024: NSR, normal axis, prolonged Qtc 508  02/01/2024: NSR, prolonged Qtc 509    Echo:  05/2024: LVEF 55-60%  04/2024: LV EF 45-50%, GLS -10%  01/2024: LV normal size and wall thickness, borderline LVEF (45-50%), normal RV size and function, trivial pericardial effusion  11/2023: LV normal size and wall thickness, mildly decreased LVEF (45%), mildly thickened mitral valve, normal RV size and function, small posterior pericardial effusion  09/2021: normal LV size and function, LVEF estimated at 65%, normal RV size and function, no significant valvular abnormalities    Stress test:  None to date    Cardiac CT/MRI:  None to date    LHC:  None to date    EP:   None to date      Medical History:       Past medical history:  Problem List[1]    Medications:   Patient's Medications   New Prescriptions    No medications on file   Previous Medications    ALBUTEROL  2.5 MG /3 ML (0.083 %) NEBULIZER SOLUTION    Inhale 3 mL (2.5 mg total) by nebulization every four (4) hours as needed for wheezing or shortness of breath.    ALBUTEROL  HFA 90 MCG/ACTUATION INHALER    Inhale 2 puffs every six (6) hours as needed for wheezing.    BLOOD SUGAR DIAGNOSTIC (GLUCOSE BLOOD) STRP    Use to check blood sugar two times a day.    BLOOD-GLUCOSE METER KIT    Use to check blood sugar two times a day. Use as instructed.    BLOOD-GLUCOSE SENSOR (DEXCOM G7 SENSOR) DEVI    1 each by Miscellaneous route every ten (10) days.    CETIRIZINE  (ZYRTEC ) 10 MG TABLET    Take 1 tablet (10 mg total) by mouth daily.    DAPSONE  100 MG TABLET    Take 1 tablet (100 mg total) by mouth daily.    ESCITALOPRAM  OXALATE (LEXAPRO ) 10 MG TABLET    Take 1 tablet (10 mg total) by mouth daily.    FLUCONAZOLE  (DIFLUCAN ) 200 MG TABLET    Take 2 tablets (400 mg total) by mouth daily.    HEPARIN , PORCINE, PF, 100 UNIT/ML SYRG    Instill 5 mL in each of the 3 central line lumens three times per week.    INSULIN  ASPART (NOVOLOG  FLEXPEN) 100 UNIT/ML (3 ML) INJECTION PEN    Give 1 unit of insulin  for every 8 grams of carbs.   Use up to 30 units/day, divided TID AC meals per carb counting.    INSULIN  GLARGINE (LANTUS  SOLOSTAR U-100 INSULIN ) 100 UNIT/ML (3 ML) INJECTION PEN    Inject 0.15 mL (15 Units total) under the skin daily.    LANCETS MISC    Use to check blood sugar as directed 2 times a day & for symptoms of high or low blood sugar.    LETERMOVIR  (PREVYMIS ) 480 MG TABLET    Take 1 tablet (480 mg total) by mouth at bedtime.    LORAZEPAM  (ATIVAN ) 0.5 MG TABLET    Take 1 tablet (0.5 mg total) by mouth every eight (8) hours as needed for anxiety.    LOSARTAN  (COZAAR ) 50 MG TABLET    Take 1 tablet (50 mg total) by mouth in the morning.    MAGNESIUM  OXIDE-MG AA  CHELATE (MAGNESIUM  PLUS PROTEIN) 133 MG    Take 2 tablets by mouth  three times daily as directed    METFORMIN  (GLUCOPHAGE -XR) 500 MG 24 HR TABLET    Take 1 tablet (500 mg total) by mouth in the morning.    ONDANSETRON  (ZOFRAN ) 8 MG TABLET    Take 1 tablet (8 mg total) by mouth Three (3) times a day as needed.    TACROLIMUS  (PROGRAF ) 0.5 MG CAPSULE    Take 1 capsule (0.5 mg total) by mouth two (2) times a day.    VALACYCLOVIR  (VALTREX ) 500 MG TABLET    Take 1 tablet (500 mg total) by mouth two (2) times a day.   Modified Medications    No medications on file   Discontinued Medications    No medications on file       Allergies:  Allergies[2]    Social History:  She  reports that she quit smoking about 8 years ago. Her smoking use included cigarettes. She has never used smokeless tobacco. She reports that she does not currently use alcohol. She reports that she does not currently use drugs.    Family History:  Her family history is not on file.    Review of Systems  Pertinent positive and negatives are documented in the HPI. The remainder of the system review is negative.      Objective:      Physical Exam  BP 124/93  - Pulse 90  - Ht 167.6 cm (5' 6)  - Wt 74.1 kg (163 lb 5.8 oz)  - SpO2 98%  - BMI 26.37 kg/m??    Wt Readings from Last 3 Encounters:   08/13/24 74.1 kg (163 lb 5.8 oz)   08/13/24 74.1 kg (163 lb 5.8 oz)   08/09/24 73 kg (160 lb 15 oz)       Physical Exam    General: Alert, NAD, sitting up comfortably in chair at side of exam table  HEENT: Sclera anicteric  Cardiac: normal rate, regular rhythm, no murmurs rubs or gallops. No JVD at 90 degrees. Radial pulses 2+ b/l  Pulmonary: CTAB, no increased work of breathing  Extremities: No LE edema. Legs are warm  Neuro: Alert and oriented. No focal deficits       Most recent labs   Lab Results   Component Value Date    Sodium 143 08/13/2024    Potassium 3.8 08/13/2024    Chloride 105 08/13/2024    CO2 23.0 08/13/2024    BUN 15 08/13/2024    Creatinine 0.61 08/13/2024    Creatinine 0.61 08/09/2024    Creatinine 0.64 08/06/2024    Magnesium  1.5 (L) 08/13/2024     Lab Results   Component Value Date    HGB 12.6 08/13/2024    MCV 96.4 (H) 08/13/2024    Platelet 142 (L) 08/13/2024     Lab Results   Component Value Date    Cholesterol, Total 230 (H) 11/21/2023    Triglycerides 361 (H) 11/21/2023    Cholesterol, HDL 39 (L) 11/21/2023    Cholesterol, HDL 37 (L) 12/27/2022    Cholesterol, HDL 42 11/15/2022    Cholesterol, Non-HDL, Calculated 191 (H) 11/21/2023    Cholesterol, LDL, Calculated 129 (H) 11/21/2023    Cholesterol, LDL, Calculated 144 (H) 12/27/2022    Cholesterol, LDL, Calculated  11/15/2022      Comment:      NHLBI Recommended Ranges, LDL Cholesterol, for Adults (20+yrs) (ATPIII), mg/dL  Optimal              <  100  Near Optimal        100-129  Borderline High     130-159  High                160-189  Very High            >=190  NHLBI Recommended Ranges, LDL Cholesterol, for Children (2-19 yrs), mg/dL  Desirable            <889  Borderline High     110-129  High                 >=130    Unable to calculate due to triglyceride greater than 400 mg/dL.    HGB A1C, POC 6.2 07/05/2024    HGB A1C, POC 5.0 03/22/2024    Hemoglobin A1C 7.1 (H) 10/17/2023    Hemoglobin A1C <3.8 (L) 11/15/2022    INR 1.15 06/26/2024    INR 1.18 06/23/2024    INR 1.08 06/19/2024    hsTroponin I 5 11/24/2023       Results            Ileana Daring, MD  Cardiology fellow PGY-4  08/13/2024    Disclaimer: [employer] voice recognition software was used to create portions of this document.  An attempt at proofreading has been made to minimize errors.  If, however, the reader notes inconsistent information and/or needs to ask questions concerning any part of the document, please contact me for clarification.  Occasional interpretation errors may inadvertently occur due to limitations in the software.            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    Sulfa (Sulfonamide Antibiotics) Anaphylaxis     Other reaction(s): Not available    Azithromycin Hives     Per patient report    Erythromycin Hives     Other reaction(s): Not available    Clindamycin      Other Reaction(s): GI discomfort     Metoprolol Other (See Comments)     Other Reaction(s): Edema  Oxycodone  Headache     Migraines

## 2024-08-06 NOTE — Patient Instructions (Signed)
 You can use an ear wax softening kit if you are uncomfortable.     A pharmacist will call you if I need to make changes to your tacrolimus  dose.     -------------------------------------------------------------------------------  Return to clinic on Friday to see one of the providers.     Lab Results   Component Value Date    WBC 6.6 08/06/2024    HGB 12.8 08/06/2024    HCT 38.8 08/06/2024    PLT 149 (L) 08/06/2024     Lab Results   Component Value Date    NA 144 08/06/2024    K 3.9 08/06/2024    CL 104 08/06/2024    CO2 25.0 08/06/2024    BUN 12 08/06/2024    CREATININE 0.64 08/06/2024    GLU 257 (H) 08/06/2024    CALCIUM  10.0 08/06/2024    MG 1.5 (L) 08/06/2024    PHOS 4.5 06/26/2024     Lab Results   Component Value Date    BILITOT 0.3 08/06/2024    BILIDIR 0.10 06/26/2024    PROT 6.9 08/06/2024    ALBUMIN 4.0 08/06/2024    ALT 71 (H) 08/06/2024    AST 39 (H) 08/06/2024    ALKPHOS 78 08/06/2024    GGT 836 (H) 11/25/2023     Lab Results   Component Value Date    INR 1.15 06/26/2024    APTT 29.0 06/26/2024       For prescription refills:   For refills, please check your medication bottles to see if you have additional refills left. If so, please call your pharmacy and follow the directions to request a refill. If you do not have any refills left, please make a request during your clinic visit or by submitting a request through MyChart or by calling [phone]. Please allow 24 hours if your request is made during the week or 48 hours if requests are made on the weekends or holidays.     --------------------------------------------------------------------------------------------------------------------  For appointments & questions Monday through Friday 8 AM-4:30 PM     Please call [phone]     On Nights, Weekends and [place] [phone]. This is the nurse's station. They will contact the provider for you.     Please visit [email or web address], a resource created just for family members and caregivers.  This website lists support services, how and where to ask for help. It has tools to assist you as you help us  care for your loved one.    [facility]  [address]  [place], [place] [address]  [email or web address]

## 2024-08-07 LAB — TACROLIMUS LEVEL: TACROLIMUS BLOOD: 7.2 ng/mL

## 2024-08-07 NOTE — Progress Notes (Signed)
 Bone Marrow Transplant and Cellular Therapy Program  Immunosuppressive Therapy Note    Aoki Wedemeyer is a 49 y.o. 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 Ms. Raven is currently day +56.    Concurrent CYP3A4 inhibitors:  - Letermovir   - Fluconazole  (on hold starting 12/13 due to elevated LFTs) restarted 07/22/24     Current dose: 0.5 mg PO BID    Goal tacrolimus  Level: 5-10 ng/mL    Resulted level: 7.2 ng/mL drawn at 12:08 PM    Lab Results   Component Value Date/Time    TACROLIMUS  7.2 08/06/2024 12:08 PM    TACROLIMUS  7.2 08/02/2024 11:33 AM    TACROLIMUS  6.7 07/30/2024 12:16 PM    TACROLIMUS  8.9 07/24/2024 11:10 AM    TACROLIMUS  10.1 06/27/2024 09:37 AM    TACROLIMUS  7.7 06/26/2024 08:21 AM       Lab Results   Component Value Date/Time    CREATININE 0.64 08/06/2024 12:08 PM    CREATININE 0.67 08/02/2024 01:35 PM    CREATININE 0.68 07/30/2024 12:16 PM      A/P  - tacrolimus  trough is stable from last visit   - SCr WNL and overall stable   - Tbili and Alk Phos are normal; AST and ALT remain elevated however continue to downtrend  - Continue tacrolimus  to 0.5 mg PO BID and plan to recheck level and CMP at next clinic visit    We will continue to monitor levels.  Patient will be followed for changes in renal and hepatic function, toxicity, and efficacy.     Thom Baptise, PharmD, BCPS, BCOP  BMT Clinical Pharmacist

## 2024-08-08 NOTE — Progress Notes (Signed)
 Outpatient BMT SW Note: SW received a message from [employer] with CPAF program. [name] reports that [name] from [facility] had contacted her to see if CPAF could cover the cost of pt's current stay.    SW consulted with Ms. Mikki about pt and situation.    SW offered to reach out to pt to further discuss financial needs.    SW called pt. Pt reports she is struggling financially with paying her lodging expenses. She reports that she currently owes about $3,000 to [facility] for her stay. She reports she does plan to use the [employer] grant that she was awarded earlier this week to go towards this bill, but she inquired if she may be eligible for additional financial assistance.    She reports that she may be cleared to return home some time in this upcoming week, but she would follow up and let SW know when she gets updates regarding this potential plan.    SW offered to submit an application to the Cancer Patient Assistance Fund to see if this program could assist with some of this cost. SW explained that currently there were no other grants available.    However, SW would put pt on a wait list in the event a program opened in the coming weeks.    Pt was appreciative.    SW encouraged pt to call her if any other needs/concerns arise.    SW completed and submitted a CPAF application to CPAF team via e-mail.    SW called pt and let her know of the approval with the program.     SW also encouraged her to reach out to SW when she has been cleared to return home, so she would notify the CPAF team.

## 2024-08-08 NOTE — Progress Notes (Signed)
 BMT Clinic Follow-up    [name]  MRN: [id number]  Encounter date: 08/09/2024    Referring Physician: Estelle Toribio SAUNDERS, MD  BMT Attending MD: Dr. Jeaneen    Transplant Day: 62     Reason for Visit: Post allogeneic transplant follow up. This visit included intensive monitoring of high-risk medications/disease states given risk of severe myelosuppression, immunosuppression, infection, and other toxicities. Review included CBC, electrolytes and other blood tests, as well as direct assessment of the patient for symptoms suggesting toxicity.    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 Her transplant course was complicated by T2DM with steroid induced hyperglycemia (stable on current regimen at discharge), prolonged Qtc on admission (resolved with last Qtc 460 from 11/20), non-infectious diarrhea (resolved without recent need for imodium ). 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 Received most recent dose of IVIG on 11/26.     Interval History  Patricia [name] is a 49 y.o. 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 [name] is now s/p a matched unrelated donor stem cell transplant      On 07/16/24, Patricia [name] was diagnosed with parainfluenza and enrolled on PUL-042.  She received received her first nebulizer treatment on 07/19/2024 and overnight developed fever and vomiting.  She withdrew from the trial on Monday, 07/22/2024 when she was seen in clinic.      Patricia [name] is a 49 year old female status post allogeneic bone marrow transplant (day 31), who presents for routine post-transplant follow-up.    Post-Transplant Status and General Well-being:  - Tolerates light activities without symptoms  - Eager to return home    Gastrointestinal and Nutritional Symptoms:  - No nausea, vomiting, diarrhea, dysgeusia, or oral mucositis  - Persistent dry sensation in the posterior oropharynx, attributed to environmental dryness; uses cough drops for relief  - Early satiety and limited tolerance of some foods  - Avoids lettuce and some vegetables  - Continues to lose weight at each visit  - Lost 45 pounds over the past year, attributed to discontinuation of prednisone , dietary changes for diabetes, and post-transplant effects  - Not distressed by current weight    Dermatologic Symptoms:  - No new rashes or lesions  - Intermittent pruritus associated with dry skin and water exposure  - Developed scalp alopecia after melphalan  chemotherapy; eyebrows and body hair preserved  - Fine vellus hair regrowth present  - Declines wigs due to discomfort; psychologically prepared for hair loss    Medication and Metabolic Status:  - Continues fluconazole  and citrulline for allergies  - Prednisone  discontinued  - Did not take diacomit on the morning of the visit  - Blood glucose levels remain stable       ID: Valtrex , Fluconazole , Dapsone . Letermovir   Chimerisms pending from 08/09/24  08/06/24: CMV and EBV not detected      Wt Readings from Last 12 Encounters:   08/09/24 73 kg (160 lb 15 oz)   08/06/24 73.4 kg (161 lb 13.1 oz)   08/02/24 73.4 kg (161 lb 13.1 oz)   07/30/24 73.8 kg (162 lb 11.2 oz)   07/24/24 72.2 kg (159 lb 2.8 oz)   07/22/24 75.1 kg (165 lb 9.1 oz)   07/19/24 74.2 kg (163 lb 9.3 oz)   07/17/24 76.2 kg (167 lb 15.9 oz)   07/16/24 76 kg (167 lb 8.8 oz)   07/09/24 76.8 kg (169 lb 5 oz)   07/05/24 76.7 kg (169  lb 3.2 oz)   07/05/24 76.8 kg (169 lb 5 oz)       Physical Exam:  BP 118/81  - Pulse 89  - Temp 36.6 ??C (97.9 ??F) (Temporal)  - Resp 16  - Wt 73 kg (160 lb 15 oz)  - SpO2 95%  - BMI 25.98 kg/m??     70, Cares for self; unable to carry on normal activity or to do active work (ECOG equivalent 1)    General: No acute distress noted.   Central venous access: Port in place. Line clean, dry, intact. No erythema or drainage noted.   ENT: Moist mucous membranes. Oropharhynx without lesions, erythema or exudate. Wax noted to right ear.  Cardiovascular: Pulse normal rate, regularity and rhythm. S1 and S2 normal, without any murmur, rub, or gallop.  Lungs: Clear Good air movement.   Skin: Warm, dry, intact. No rash noted.    Psychiatry: Alert and oriented to person, place, and time.   Gastrointestinal/Abdomen: Normoactive bowel sounds, abdomen soft, non-tender   Musculoskeletal/Extremities: FROM throughout. No edema  Neurologic: CNII-XII intact. Normal strength and sensation throughout             Acute GVHD Assessment          Most Recent Value    07/30/2024 - 08/09/2024 Allogeneic BMT Unknown Phase - Planned 06/12/2024-Post 1 Year    07/30/2024 13:23 Allogeneic BMT Unknown Phase - Planned 06/12/2024-Post 1 Year    08/02/2024 17:17   Acute GVHD Grade and Severity   Overall Grade (Przepiorka) 0  08/02/2024 0 0       Assessment/Plan:  1) SCT Summary:  - Type of Transplant: RIC MUD Allo (Fludarabine  40 mg/m2/Melphalan  100 mg/m2)  - Graft Source: Fresh PBSCs10/10, Female, ABO O-, CMV negative     2) Disease: ALL  - Pre BMT disease status: CR MRD Negative  - Signs/symptoms of disease relapse: None   - Restaging plan:    *Next BMBX: Day +90   *Next Chimerism: Day +60 (ordered for 1/9)    Date Source Unfractionated CD3   07/12/24  PB >95% 93   08/09/24 PB           3) Immunosuppressed: High risk of infections, if patient develops a fever will admit to inpatient unit for IV antibiotics and fever workup   - Prophylaxis:          * Antiviral: Valtrex  500 mg po BID through 2 years post transplant.   Letermovir  480 mg po daily through day +200 to prevent CMV infections   * Antifungal: Fluconazole  400 mg po daily through day +75  * PJP: Sulfa allergic. Dapsone  daily upon platelet engraftment, has tolerated this well in the past. Toxo PCR negative.    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   Previously required Valcyte  treatment. 11/16 CMV viral load undetectable.  - Continue Letermovir  and Valtrex  BID through day +200 post SCT.     Recurrent Acute Sinusitis  Recurrent acute sinusitis with nasal congestion, frontal headache, and low-grade fever. Responded well to Augmentin .  -06/07/24 current sx resolving, started Augmentin  10 days (10/30-11/9)    Parainfluenza:  -12/16: congestion, cough, sinus pressure. RPP +parainfluenza. Consented to pulmotect study.   - 12/19: 1st nebulizer treatment on Pumotect study, plan for 2nd treatment 12/22 and 3rd treatment 12/23.   - 12/22: Developed fever and N/V after 1st nebulizer. Per patient  intolerance to nebulizer she has declined further doses and would like to withdraw from the study. Given increased albuterol  used CXR obtained which was clear. Continue with symptomatic treatment. Starting Agumentin x 10 days for possible secondary sinusitis. Sent screening cultures today due to fever of 101 over the weekend (likely secondary to Paraflu but will rule out bacteremia).   - 12/24: Cough is worsening today so we repeated a chest x-ray, which was clear.  Ms. Fusilier is not eligible for any further treatment on PUL-042.  Ms. Beckstrand has been feeling better with intermittent albuterol  nebulizers so I prescribed additional nebulizer vials.  07/30/24: Recovering from parainfluenza with residual cough and mild dysphonia.   - Ordered chest x-ray and nasal respiratory swab for Friday visit.  - Continued albuterol  as needed.     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   Prophylaxis:  - Methotrexate  5 mg/m2 IVP on days +1, +3, +6 and +11  - Tacrolimus      * Goal trough: 5-10     * High risk drug monitoring and dose adjustments per BMT Pharmacist    * Signs/symptoms of toxicity: None      07/30/24:- Monitored for GVHD signs, especially with elevated eosinophils.  -08/06/24: Eosinophilia resolved.    5) Heme  Transfusion criteria: Transfuse 1 unit of PRBCs for hemoglobin <7 and 1 unit of platelets for Plt <10K or bleeding. [name] Wilcher does not have a history of transfusion reactions. Consent was obtained and documented on 06/05/24.     6) GI/Liver:   GERD prophylaxis: Pepcid  BID    Nausea:PRN zofran  and compazine     Diarrhea: PRN Imodium     Elevated liver enzymes:  - Started trending up on 12/5 with mildly elevated AST  - On 12/9 both AST and ALT elevated- Dapsone  and Pepcid  held  - 12/12: AST 112 and ALT 172 normal bili. Will hold Fluconazole  until next visit to re-evaluate. Sending adeno and HHV-6. CMV and EBV have been negative.   - 12/16: AST improved, ALT stable. Like up from URI (tested positive for parainfluenza today). Possibly secondary medication (metformin , fluc, dapsone , and pepcid ). Fluc, dapsone , and pepcid  on hold. If LFts stable/improved next visit would restart fluconazole .   - 12/19: AST/ALT worsened today, likely secondary to parainfluenza. Repeat labs on Monday if LFTs stable/imrpoved plan to re-challenge fluc, if worsen will need to setup IV micafungin.   - 12/22: AST/ALT improving thus likely secondary to URI. Will rechallenge with fluconazole .   - 12/24: AST/ALT continue to improve.  I suspect this is resolution after URI.  Can resume dapsone .  --Now resolved     7) Hypomagnesemia:   Mag chelate 2 tabs TID    8)  Heart failure with mildly reduced ejection fraction (HFmrEF)   Followed by [facility]:  Due to anythracycline use  Hx of EF 45-50%, most recent Echo 05/23/24 improved to 55-60%  Hx of prolonged QTc  - Home med Losartan  50 mg daily (held 11/18)      BP Readings from Last 3  Encounters:   08/09/24 118/81   08/06/24 117/88   08/02/24 112/74      9) Pulm/Allergies   - cetirizine  10 mg daily   - albuterol  PRN     10) Menses suppression: Lupron  last given 07/26/22. No menses sense (peri-menopausal/menopausal)  - 12/19: screening urine pregnancy test per study resulted as positive, repeat urine test indeterminate. Serum beta HcG 5.9. Low level Hcg can often be seen in peri-menopausal women who are not pregnant. She has had chemotherapy and only 1 month post transplant so would consider this a negative pregnancy screen.      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 At home uses Dexcom for monitoring. Has follow up with Endocrinology on 07/05/24.   - Increase insulin  glargine from 12u to 15u once daily  - Continue insulin  aspart with meals per carb counting (1 unit for every 8 grams of carbs)  - Endocrine added Metformin  500 mg XR daily on 07/05/24. Next follow up 08/28/23    12) Pain:  - PRN tramadol  50 mg. No oxy as this give patient migraines     13) Anxiety:  Lexapro  10 mg daily (has PRN ativan  available but has not needed)  Reconnected with her local therapist      14) DispoCaregivers: Lives in [place], [place]  Primary caregiver: Jeanie Tate (mom)  Secondary caregiver: Alphia Behanna (husband)    15) Bone Health: DEXA 07/09/24: Results with normal bone density      Maralee PARAS Yunior Jain, FNP  [facility]    I personally spent 43 minutes face-to-face and non-face-to-face in the care of this patient, which includes all pre, intra, and post visit time on the date of service.  All documented time was specific to the E/M visit and does not include any procedures that may have been performed.  Time was spent on clinical assessment, placing orders / referrals, and/or prescription management, all pertaining to the diagnoses listed above.         Future Appointments   Date Time Provider Department Center   08/13/2024  9:30 AM ONCBMT LABS [facility]   08/13/2024 10:30 AM [name], MD [facility]ON[facility] 08/13/2024  1:25 PM Delores Fleeting, MD UNCHRTVASET [facility]   08/16/2024 11:00 AM ONCBMT LABS [facility]   08/16/2024 11:15 AM ONCBMT NURSE [facility]   08/16/2024 11:45 AM ONCBMT COORDINATOR [facility]   08/16/2024 12:00 PM ONCBMT APP A [facility]   08/27/2024  9:50 AM Kome, Arlean HERO, CPP UNCDIABENDET [facility]   09/05/2024 10:00 AM Jarold Motto T [facility]CDIABENDET [facility]   09/10/2024  9:00 AM ONCBMT LABS [facility]   09/10/2024 10:00 AM ONCBMT APP A [facility]   09/10/2024 11:00 AM Polly Alyce Dover, MD ONCMULTI [facility]   09/10/2024 12:00 PM ONCBMT PROCEDURES [id number] [facility]   09/17/2024  9:15 AM ONCBMT LABS [facility]   09/17/2024 10:00 AM [name], MD [facility]ON[facility]   09/17/2024 11:00 AM CARSER EKG [id number] [facility]   09/24/2024  8:15 AM ONCBMT LABS [facility]   09/24/2024  9:00 AM ONCBMT APP A [facility]   09/24/2024 10:00 AM ONCBMT PHARMACY [facility]   09/24/2024 10:30 AM ONCBMT COORDINATOR [facility]ON[facility]   12/10/2024  9:30 AM ONCBMT LABS [facility]   12/10/2024 10:30 AM ONCBMT PHARMACY [facility]  12/27/2024  9:40 AM Storm Honer, MD UNCDIABENDET [facility]

## 2024-08-09 ENCOUNTER — Ambulatory Visit
Admit: 2024-08-09 | Discharge: 2024-08-09 | Payer: PRIVATE HEALTH INSURANCE | Attending: Critical Care Medicine | Primary: Critical Care Medicine

## 2024-08-09 ENCOUNTER — Other Ambulatory Visit: Admit: 2024-08-09 | Discharge: 2024-08-09 | Payer: PRIVATE HEALTH INSURANCE

## 2024-08-09 DIAGNOSIS — C9100 Acute lymphoblastic leukemia not having achieved remission: Principal | ICD-10-CM

## 2024-08-09 DIAGNOSIS — Z9481 Bone marrow transplant status: Principal | ICD-10-CM

## 2024-08-09 DIAGNOSIS — R7989 Other specified abnormal findings of blood chemistry: Principal | ICD-10-CM

## 2024-08-09 DIAGNOSIS — E1165 Type 2 diabetes mellitus with hyperglycemia: Principal | ICD-10-CM

## 2024-08-09 DIAGNOSIS — Z5181 Encounter for therapeutic drug level monitoring: Principal | ICD-10-CM

## 2024-08-09 DIAGNOSIS — Z794 Long term (current) use of insulin: Principal | ICD-10-CM

## 2024-08-09 DIAGNOSIS — Z9189 Other specified personal risk factors, not elsewhere classified: Principal | ICD-10-CM

## 2024-08-09 DIAGNOSIS — R059 Cough, unspecified type: Principal | ICD-10-CM

## 2024-08-09 DIAGNOSIS — D696 Thrombocytopenia, unspecified: Principal | ICD-10-CM

## 2024-08-09 LAB — CBC W/ AUTO DIFF
BASOPHILS ABSOLUTE COUNT: 0 10*9/L (ref 0.0–0.1)
BASOPHILS RELATIVE PERCENT: 0.8 %
EOSINOPHILS ABSOLUTE COUNT: 0.4 10*9/L (ref 0.0–0.5)
EOSINOPHILS RELATIVE PERCENT: 6.4 %
HEMATOCRIT: 37.7 % (ref 34.0–44.0)
HEMOGLOBIN: 12.3 g/dL (ref 11.3–14.9)
LYMPHOCYTES ABSOLUTE COUNT: 1.7 10*9/L (ref 1.1–3.6)
LYMPHOCYTES RELATIVE PERCENT: 29.4 %
MEAN CORPUSCULAR HEMOGLOBIN CONC: 32.6 g/dL (ref 32.0–36.0)
MEAN CORPUSCULAR HEMOGLOBIN: 31.6 pg (ref 25.9–32.4)
MEAN CORPUSCULAR VOLUME: 97.2 fL — ABNORMAL HIGH (ref 77.6–95.7)
MEAN PLATELET VOLUME: 9.9 fL (ref 6.8–10.7)
MONOCYTES ABSOLUTE COUNT: 0.5 10*9/L (ref 0.3–0.8)
MONOCYTES RELATIVE PERCENT: 8.4 %
NEUTROPHILS ABSOLUTE COUNT: 3.2 10*9/L (ref 1.8–7.8)
NEUTROPHILS RELATIVE PERCENT: 55 %
PLATELET COUNT: 136 10*9/L — ABNORMAL LOW (ref 150–450)
RED BLOOD CELL COUNT: 3.87 10*12/L — ABNORMAL LOW (ref 3.95–5.13)
RED CELL DISTRIBUTION WIDTH: 15.1 % (ref 12.2–15.2)
WBC ADJUSTED: 5.7 10*9/L (ref 3.6–11.2)

## 2024-08-09 LAB — COMPREHENSIVE METABOLIC PANEL
ALBUMIN: 4.1 g/dL (ref 3.4–5.0)
ALKALINE PHOSPHATASE: 77 U/L (ref 46–116)
ALT (SGPT): 55 U/L — ABNORMAL HIGH (ref 10–49)
ANION GAP: 14 mmol/L (ref 5–14)
AST (SGOT): 33 U/L (ref <=34)
BILIRUBIN TOTAL: 0.3 mg/dL (ref 0.3–1.2)
BLOOD UREA NITROGEN: 8 mg/dL — ABNORMAL LOW (ref 9–23)
BUN / CREAT RATIO: 13
CALCIUM: 9.9 mg/dL (ref 8.7–10.4)
CHLORIDE: 104 mmol/L (ref 98–107)
CO2: 25 mmol/L (ref 20.0–31.0)
CREATININE: 0.61 mg/dL (ref 0.55–1.02)
EGFR CKD-EPI (2021) FEMALE: >90 mL/min/1.73m2 (ref >=60)
GLUCOSE RANDOM: 193 mg/dL — ABNORMAL HIGH (ref 70–179)
POTASSIUM: 3.9 mmol/L (ref 3.4–4.8)
PROTEIN TOTAL: 6.9 g/dL (ref 5.7–8.2)
SODIUM: 143 mmol/L (ref 135–145)

## 2024-08-09 LAB — MAGNESIUM: MAGNESIUM: 1.5 mg/dL — ABNORMAL LOW (ref 1.6–2.6)

## 2024-08-09 LAB — TACROLIMUS LEVEL: TACROLIMUS BLOOD: 5.1 ng/mL

## 2024-08-09 MED ORDER — CETIRIZINE 10 MG TABLET
ORAL_TABLET | Freq: Every day | ORAL | 3 refills | 90.00000 days | Status: CP
Start: 2024-08-09 — End: ?
  Filled 2024-08-09: qty 90, 90d supply, fill #0

## 2024-08-09 MED ORDER — FLUCONAZOLE 200 MG TABLET
ORAL_TABLET | Freq: Every day | ORAL | 1 refills | 30.00000 days | Status: CP
Start: 2024-08-09 — End: ?
  Filled 2024-08-09: qty 60, 30d supply, fill #0

## 2024-08-09 NOTE — Patient Instructions (Signed)
 VISIT SUMMARY:  Today, you had a routine follow-up visit to monitor your status post bone marrow transplant and to evaluate your ongoing weight loss. You are clinically stable with no signs of infection or acute complications. We discussed your weight loss and nutritional status, and provided guidance on what to expect in the coming weeks.    YOUR PLAN:  [place] CHROMOSOME POSITIVE ACUTE LYMPHOBLASTIC LEUKEMIA STATUS POST BONE MARROW TRANSPLANT: You are stable at day 58 post-transplant with no infection, neutropenia, or acute complications. Your lab values are normal and liver function has improved.  -Continue taking fluconazole  until day 75, then discontinue.  -Refilled your fluconazole  and citrulline prescriptions.  -Follow up with your primary transplant physician on Tuesday.  -Bone marrow biopsy scheduled for February 10.  -Continue twice weekly follow-up appointments on Tuesdays and Thursdays.  -Monitor laboratory values including chimerism, CBC, and liver function.  -Follow the provided guidance regarding home visits and safety.    ABNORMAL WEIGHT LOSS FOLLOWING BONE MARROW TRANSPLANT: You have lost 45 pounds over the past year due to prednisone  cessation, dietary changes for diabetes, and post-transplant effects. Early satiety is noted, and weight loss is expected to stabilize around day 75 post-transplant.  -Monitor your weight trajectory and nutritional status.  -Expect your weight to stabilize within two weeks.  -Discussed dietary limitations and challenges with oral intake.  -Provided guidance regarding stabilization of weight after discontinuation of fluconazole .

## 2024-08-09 NOTE — Progress Notes (Signed)
 Bone Marrow Transplant and Cellular Therapy Program  Immunosuppressive Therapy Note    Patricia [name] is a 49 y.o. 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 Patricia [name] is currently day +58.    Concurrent CYP3A4 inhibitors:  - Letermovir   - Fluconazole  (on hold starting 12/13 due to elevated LFTs) restarted 07/22/24     Current dose: 0.5 mg PO BID    Goal tacrolimus  Level: 5-10 ng/mL    Resulted level: 5.1 ng/mL drawn at 11:53 AM    Lab Results   Component Value Date/Time    TACROLIMUS  5.1 08/09/2024 11:53 AM    TACROLIMUS  7.2 08/06/2024 12:08 PM    TACROLIMUS  7.2 08/02/2024 11:33 AM    TACROLIMUS  6.7 07/30/2024 12:16 PM    TACROLIMUS  10.1 06/27/2024 09:37 AM    TACROLIMUS  7.7 06/26/2024 08:21 AM       Lab Results   Component Value Date/Time    CREATININE 0.61 08/09/2024 11:53 AM    CREATININE 0.64 08/06/2024 12:08 PM    CREATININE 0.67 08/02/2024 01:35 PM      A/P  - tacrolimus  trough remains within therapeutic range and is closer to mid-range when accounting for late morning lab draw   - SCr WNL and overall stable   - Tbili and AST/Alk Phos are normal; ALT remains elevated however continues to downtrend  - Plan to continue tacrolimus  to 0.5 mg PO BID and plan to recheck level and CMP at next clinic visit    We will continue to monitor levels.  Patient will be followed for changes in renal and hepatic function, toxicity, and efficacy.     Elizebeth Has, PharmD, BCOP  Clinical Pharmacist Practitioner, BMTCT

## 2024-08-10 DIAGNOSIS — C9100 Acute lymphoblastic leukemia not having achieved remission: Principal | ICD-10-CM

## 2024-08-10 DIAGNOSIS — B259 Cytomegaloviral disease, unspecified: Principal | ICD-10-CM

## 2024-08-12 DIAGNOSIS — C9100 Acute lymphoblastic leukemia not having achieved remission: Principal | ICD-10-CM

## 2024-08-12 NOTE — Progress Notes (Signed)
 [facility] Refill Coordination Note    Patricia [name], DOB: March 31, 1976  Phone: [phone] (home)       All above HIPAA information was verified with patient.         08/12/2024    12:10 PM   Specialty Rx Medication Refill Questionnaire   Which Medications would you like refilled and shipped? Prevymis    Please list all current allergies: Same as on chart   Have you missed any doses in the last 30 days? No   Have you had any changes to your medication(s) since your last refill? No   How much of each medication do you have remaining at home? (eg. number of tablets, injections, etc.) 1 week   Have you experienced any side effects in the last 30 days? No   Please enter the full address (street address, city, state, zip code) where you would like your medication(s) to be delivered to. [address], [place] [address]   Please specify on which day you would like your medication(s) to arrive. Note: if you need your medication(s) within 3 days, please call the pharmacy to schedule your order at [phone]  08/16/2024   Has your insurance changed since your last refill? No   Would you like a pharmacist to call you to discuss your medication(s)? No   Do you require a signature for your package? (Note: if we are billing Medicare Part B or your order contains a controlled substance, we will require a signature) No   I have been provided my out of pocket cost for my medication and approve the pharmacy to charge the amount to my credit card on file. Yes         Completed refill call assessment today to schedule patient's medication shipment from the [facility] [phone]).  All relevant notes have been reviewed.       Confirmed patient received a Conservation Officer, Historic Buildings and a Surveyor, Mining with first shipment. The patient will receive a drug information handout for each medication shipped and additional FDA Medication Guides as required. REFERRAL TO PHARMACIST     Referral to the pharmacist: Not needed      [facility]     Shipping address confirmed in Epic.     Delivery Scheduled: Yes, Expected medication delivery date: 08/16/24.     Medication will be delivered via Next Day Courier to the prescription address in Epic [place].    Laritza Vokes M Santer Torres   [facility] Specialty Technician

## 2024-08-12 NOTE — Progress Notes (Signed)
 BMT Clinic Follow-up    [name]  MRN: [id number]  Encounter date: 08/13/2024    Referring Physician: Estelle Toribio SAUNDERS, MD  BMT Attending MD: Dr. Jeaneen    Transplant Day: 56     Reason for Visit: Post allogeneic transplant follow up. This visit included intensive monitoring of high-risk medications/disease states given risk of severe myelosuppression, immunosuppression, infection, and other toxicities. Review included CBC, electrolytes and other blood tests, as well as direct assessment of the patient for symptoms suggesting toxicity.    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 Her transplant course was complicated by T2DM with steroid induced hyperglycemia (stable on current regimen at discharge), prolonged Qtc on admission (resolved with last Qtc 460 from 11/20), non-infectious diarrhea (resolved without recent need for imodium ). 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 Received most recent dose of IVIG on 11/26.     Interval History  Patricia [name] is a 49 y.o. 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 Patricia [name] is now s/p a matched unrelated donor stem cell transplant      On 07/16/24, Patricia [name] was diagnosed with parainfluenza and enrolled on PUL-042.  She received her first nebulizer treatment on 07/19/2024 and overnight developed fever and vomiting.  She withdrew from the trial on Monday, 07/22/2024 when she was seen in clinic.    Patricia [name] is a 49 year old female status post allogeneic bone marrow transplant (day 26), who presents for routine post-transplant follow-up.    Post-Transplant Status and General Well-being:  - Tolerates light activities without symptoms  - Eager to return home    Gastrointestinal and Nutritional Symptoms:  - No nausea, vomiting, diarrhea, dysgeusia, or oral mucositis  - Persistent dry sensation in the posterior oropharynx, attributed to environmental dryness; uses cough drops for relief  - Early satiety and limited tolerance of some foods  - Avoids lettuce and some vegetables  - Continues to lose weight at each visit  - Lost 45 pounds over the past year, attributed to discontinuation of prednisone , dietary changes for diabetes, and post-transplant effects  - Not distressed by current weight    Dermatologic Symptoms:  - No new rashes or lesions  - Intermittent pruritus associated with dry skin and water exposure  - Developed scalp alopecia after melphalan  chemotherapy; eyebrows and body hair preserved  - Fine vellus hair regrowth present  - Declines wigs due to discomfort; psychologically prepared for hair loss    Medication and Metabolic Status:  - Continues fluconazole  and citrulline for allergies  - Prednisone  discontinued  - Did not take diacomit on the morning of the visit  - Blood glucose levels remain stable     Cold symptoms resolved. She has residual musculoskeletal/pleuritic chest pain. She has had bad cough, which she thinks triggered chest pain.   Now cough resolved. Congestion back to baseline. Denies SOB.   CXR not indicated at this time, but will follow closely.       ID: Valtrex , Fluconazole , Dapsone . Letermovir   Chimerisms pending from 08/09/24  08/06/24: CMV and EBV not detected    Wt Readings from Last 12 Encounters:   08/09/24 73 kg (160 lb 15 oz)   08/06/24 73.4 kg (161 lb 13.1 oz)   08/02/24 73.4 kg (161 lb 13.1 oz)   07/30/24 73.8 kg (162 lb 11.2 oz)   07/24/24 72.2 kg (159 lb 2.8 oz)   07/22/24 75.1 kg (165 lb 9.1 oz)  07/19/24 74.2 kg (163 lb 9.3 oz)   07/17/24 76.2 kg (167 lb 15.9 oz)   07/16/24 76 kg (167 lb 8.8 oz)   07/09/24 76.8 kg (169 lb 5 oz)   07/05/24 76.7 kg (169 lb 3.2 oz)   07/05/24 76.8 kg (169 lb 5 oz)     Physical Exam:  BP 124/93  - Pulse 90  - Temp 36.8 ??C (98.2 ??F) (Oral)  - Resp 18  - Wt 74.1 kg (163 lb 5.8 oz)  - SpO2 98%  - BMI 26.37 kg/m??     80, Normal activity with effort; some signs or symptoms of disease (ECOG equivalent 1)    General: No acute distress noted.   Central venous access: Port in place. Line clean, dry, intact. No erythema or drainage noted.   ENT: Moist mucous membranes. Oropharhynx without lesions, erythema or exudate. Wax noted to right ear.  Cardiovascular: Pulse normal rate, regularity and rhythm. S1 and S2 normal, without any murmur, rub, or gallop.  Lungs: Clear Good air movement.   Skin: Warm, dry, intact. No rash noted.    Psychiatry: Alert and oriented to person, place, and time.   Gastrointestinal/Abdomen: Normoactive bowel sounds, abdomen soft, non-tender   Musculoskeletal/Extremities: FROM throughout. No edema. Mild pain R chest triggered by movement and by deep inspiration.   Neurologic: CNII-XII intact. Normal strength and sensation throughout.    Acute GVHD Assessment          Most Recent Value    07/30/2024 - 08/12/2024 Allogeneic BMT Unknown Phase - Planned 06/12/2024-Post 1 Year    07/30/2024 13:23 Allogeneic BMT Unknown Phase - Planned 06/12/2024-Post 1 Year    08/02/2024 17:17 Allogeneic BMT Unknown Phase - Planned 06/12/2024-Post 1 Year    08/09/2024 13:28   Acute GVHD Grade and Severity   Overall Grade (Przepiorka) 0  08/09/2024 0 0 0       Assessment:  1) SCT Summary:  - Type of Transplant: RIC MUD Allo (Fludarabine  40 mg/m2/Melphalan  100 mg/m2)  - Graft Source: Fresh PBSCs10/10, Female, ABO O-, CMV negative     2) Disease: ALL  - Pre BMT disease status: CR MRD Negative  - Signs/symptoms of disease relapse: None   - Restaging plan:    *Next BMBX: Day +90   *Next Chimerism: Day +60 (ordered for 1/9)    Date Source Unfractionated CD3   07/12/24  PB >95% 93   08/09/24 PB         3) Immunosuppressed: High risk of infections, if patient develops a fever will admit to inpatient unit for IV antibiotics and fever workup   - Prophylaxis:          * Antiviral: Valtrex  500 mg po BID through 2 years post transplant.   Letermovir  480 mg po daily through day +200 to prevent CMV infections   * Antifungal: Fluconazole  400 mg po daily through day +75  * PJP: Sulfa allergic. Dapsone  daily upon platelet engraftment, has tolerated this well in the past. Toxo PCR negative.    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   Previously required Valcyte  treatment. 11/16 CMV viral load undetectable.  - Continue Letermovir  and Valtrex  BID through day +200 post SCT.     Recurrent Acute Sinusitis  Recurrent acute sinusitis with nasal congestion, frontal headache, and low-grade fever. Responded well to Augmentin .  -06/07/24 current sx resolving, started Augmentin  10  days (10/30-11/9)    Parainfluenza:  -12/16: congestion, cough, sinus pressure. RPP +parainfluenza. Consented to pulmotect study.   - 12/19: 1st nebulizer treatment on Pumotect study, plan for 2nd treatment 12/22 and 3rd treatment 12/23.   - 12/22: Developed fever and N/V after 1st nebulizer. Per patient intolerance to nebulizer she has declined further doses and would like to withdraw from the study. Given increased albuterol  used CXR obtained which was clear. Continue with symptomatic treatment. Starting Agumentin x 10 days for possible secondary sinusitis. Sent screening cultures today due to fever of 101 over the weekend (likely secondary to Paraflu but will rule out bacteremia).   - 12/24: Cough is worsening today so we repeated a chest x-ray, which was clear.  Ms. Beddow is not eligible for any further treatment on PUL-042.  Ms. Adamski has been feeling better with intermittent albuterol  nebulizers so I prescribed additional nebulizer vials.  07/30/24: Recovering from parainfluenza with residual cough and mild dysphonia.   - Ordered chest x-ray and nasal respiratory swab for Friday visit.  - Continued albuterol  as needed.   08/06/24: Clinically recovered. Had a course of antibiotics.     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   Prophylaxis:  - Methotrexate  5 mg/m2 IVP on days +1, +3, +6 and +11  - Tacrolimus      * Goal trough: 5-10     * High risk drug monitoring and dose adjustments per BMT Pharmacist    * Signs/symptoms of toxicity: None      07/30/24:- Monitored for GVHD signs, especially with elevated eosinophils.  -08/06/24: Eosinophilia resolved.    5) Heme  Transfusion criteria: Transfuse 1 unit of PRBCs for hemoglobin <7 and 1 unit of platelets for Plt <10K or bleeding. [name] Grein does not have a history of transfusion reactions. Consent was obtained and documented on 06/05/24.     6) GI/Liver:   GERD prophylaxis: Pepcid  BID    Nausea:PRN zofran  and compazine     Diarrhea: PRN Imodium     Elevated liver enzymes:  - Started trending up on 12/5 with mildly elevated AST  - On 12/9 both AST and ALT elevated- Dapsone  and Pepcid  held  - 12/12: AST 112 and ALT 172 normal bili. Will hold Fluconazole  until next visit to re-evaluate. Sending adeno and HHV-6. CMV and EBV have been negative.   - 12/16: AST improved, ALT stable. Like up from URI (tested positive for parainfluenza today). Possibly secondary medication (metformin , fluc, dapsone , and pepcid ). Fluc, dapsone , and pepcid  on hold. If LFts stable/improved next visit would restart fluconazole .   - 12/19: AST/ALT worsened today, likely secondary to parainfluenza. Repeat labs on Monday if LFTs stable/imrpoved plan to re-challenge fluc, if worsen will need to setup IV micafungin.   - 12/22: AST/ALT improving thus likely secondary to URI. Will rechallenge with fluconazole .   - 12/24: AST/ALT continue to improve.  I suspect this is resolution after URI.  Can resume dapsone .  --Now resolved     7) Hypomagnesemia:   Mag chelate 2 tabs TID  8)  Heart failure with mildly reduced ejection fraction (HFmrEF)   Followed by [facility]:  Due to anythracycline use  Hx of EF 45-50%, most recent Echo 05/23/24 improved to 55-60%  Hx of prolonged QTc  - Home med Losartan  50 mg daily (held 11/18)    BP Readings from Last 3 Encounters:   08/09/24 118/81   08/06/24 117/88   08/02/24 112/74      9) Pulm/Allergies   - cetirizine  10 mg daily   - albuterol  PRN     10) Menses suppression: Lupron  last given 07/26/22. No menses sense (peri-menopausal/menopausal)  - 12/19: screening urine pregnancy test per study resulted as positive, repeat urine test indeterminate. Serum beta HcG 5.9. Low level Hcg can often be seen in peri-menopausal women who are not pregnant. She has had chemotherapy and only 1 month post transplant so would consider this a negative pregnancy screen.      11) Type 2 DM exacerbated by steroids: Followed by [facility]. At home uses Dexcom for monitoring. Has follow up with Endocrinology on 07/05/24.   - Increase insulin  glargine from 12u to 15u once daily  - Continue insulin  aspart with meals per carb counting (1 unit for every 8 grams of carbs)  - Endocrine added Metformin  500 mg XR daily on 07/05/24. Next follow up 08/28/23    12) Pain:  - PRN tramadol  50 mg. No oxy as this give patient migraines     13) Anxiety:  Lexapro  10 mg daily (has PRN ativan  available but has not needed)  Reconnected with her local therapist      14) DispoCaregivers: Lives in [place], [place]  Primary caregiver: Jeanie Tate (mom)  Secondary caregiver: Ahonesty Woodfin (husband)    15) Bone Health: DEXA 07/09/24: Results with normal bone density    Plan:   Patient can move back home.  Discussed on-going need for full time caregiver.   Continue to follow closely.     Constance Whittle J Angelamarie Avakian, MD  [facility] Transplant [facility] Cellular Therapy [facility]      Future Appointments   Date Time Provider Department Center   08/13/2024  9:30 AM ONCBMT LABS HON[facility]   08/13/2024 10:30 AM Patricia [name] Dossie Craze, MD HONCBMT [facility]   08/13/2024  1:25 PM Delores Fleeting, MD UNCHRTVASET [facility]   08/16/2024 11:00 AM ONCBMT LABS [facility]   08/16/2024 11:15 AM ONCBMT NURSE [facility]   08/16/2024 11:45 AM ONCBMT COORDINATOR [facility]   08/16/2024 12:00 PM ONCBMT APP A [facility]   08/20/2024  1:00 PM ONCBMT LABS [facility]   08/20/2024  2:00 PM ONCBMT APP A [facility]   08/23/2024 11:15 AM ONCBMT LABS [facility]   08/23/2024 12:00 PM ONCBMT APP A [facility]   08/27/2024  9:50 AM Kome, Arlean HERO, CPP UNCDIABENDET [facility]   08/27/2024 12:00 PM ONCBMT LABS [facility]ON[facility]MT [facility]   08/27/2024  1:00 PM ONCBMT APP B [facility]   08/30/2024 11:00 AM ONCBMT LABS [facility]ON[facility]   08/30/2024 12:00 PM ONCBMT APP B [facility]   09/03/2024  9:15 AM ONCBMT LABS [facility]ON[facility]   09/03/2024 10:00 AM ONCBMT APP B [facility]   09/05/2024 10:00 AM Jarold Motto T UNCDIABENDET [facility]   09/06/2024  9:15 AM ONCBMT LABS HON[facility]MT [facility]   09/06/2024 10:00 AM ONCBMT APP B [facility]   09/10/2024  9:00  AM ONCBMT LABS [facility]ON[facility]MT [facility]   09/10/2024 10:00 AM ONCBMT APP A [facility]   09/10/2024 11:00 AM Polly Alyce Dover, MD ONCMULTI [facility]   09/10/2024 12:00 PM ONCBMT PROCEDURES [id number] [facility]   09/17/2024  9:15 AM ONCBMT LABS HON[facility]MT [facility]   09/17/2024 10:00 AM Patricia [name] Dossie Craze, MD HONCBMT [facility]   09/17/2024 11:00 AM CARSER EKG [id number] [facility]   09/24/2024  8:15 AM ONCBMT LABS HONCBMT [facility]   09/24/2024  9:00 AM ONCBMT APP A [facility]ON[facility]   09/24/2024 10:00 AM ONCBMT PHARMACY HON[facility]MT [facility]   09/24/2024 10:30 AM ONCBMT COORDINATOR [facility]ON[facility]MT [facility]   12/10/2024  9:30 AM ONCBMT LABS HON[facility]MT [facility]   12/10/2024 10:30 AM ONCBMT PHARMACY HONCBMT [facility]   12/27/2024  9:40 AM Storm Honer, MD UNCDIABENDET [facility]

## 2024-08-13 ENCOUNTER — Ambulatory Visit: Admit: 2024-08-13 | Discharge: 2024-08-14 | Payer: PRIVATE HEALTH INSURANCE

## 2024-08-13 ENCOUNTER — Other Ambulatory Visit: Admit: 2024-08-13 | Discharge: 2024-08-14 | Payer: PRIVATE HEALTH INSURANCE

## 2024-08-13 DIAGNOSIS — I10 Essential (primary) hypertension: Principal | ICD-10-CM

## 2024-08-13 DIAGNOSIS — Z9481 Bone marrow transplant status: Principal | ICD-10-CM

## 2024-08-13 DIAGNOSIS — C9100 Acute lymphoblastic leukemia not having achieved remission: Principal | ICD-10-CM

## 2024-08-13 DIAGNOSIS — I5021 Acute systolic (congestive) heart failure: Principal | ICD-10-CM

## 2024-08-13 LAB — COMPREHENSIVE METABOLIC PANEL
ALBUMIN: 4.1 g/dL (ref 3.4–5.0)
ALKALINE PHOSPHATASE: 74 U/L (ref 46–116)
ALT (SGPT): 40 U/L (ref 10–49)
ANION GAP: 15 mmol/L — ABNORMAL HIGH (ref 5–14)
AST (SGOT): 24 U/L (ref <=34)
BILIRUBIN TOTAL: 0.3 mg/dL (ref 0.3–1.2)
BLOOD UREA NITROGEN: 15 mg/dL (ref 9–23)
BUN / CREAT RATIO: 25
CALCIUM: 9.6 mg/dL (ref 8.7–10.4)
CHLORIDE: 105 mmol/L (ref 98–107)
CO2: 23 mmol/L (ref 20.0–31.0)
CREATININE: 0.61 mg/dL (ref 0.55–1.02)
EGFR CKD-EPI (2021) FEMALE: >90 mL/min/1.73m2 (ref >=60)
GLUCOSE RANDOM: 186 mg/dL — ABNORMAL HIGH (ref 70–179)
POTASSIUM: 3.8 mmol/L (ref 3.4–4.8)
PROTEIN TOTAL: 6.8 g/dL (ref 5.7–8.2)
SODIUM: 143 mmol/L (ref 135–145)

## 2024-08-13 LAB — CBC W/ AUTO DIFF
BASOPHILS ABSOLUTE COUNT: 0 10*9/L (ref 0.0–0.1)
BASOPHILS RELATIVE PERCENT: 0.7 %
EOSINOPHILS ABSOLUTE COUNT: 0.4 10*9/L (ref 0.0–0.5)
EOSINOPHILS RELATIVE PERCENT: 7.1 %
HEMATOCRIT: 38 % (ref 34.0–44.0)
HEMOGLOBIN: 12.6 g/dL (ref 11.3–14.9)
LYMPHOCYTES ABSOLUTE COUNT: 1.8 10*9/L (ref 1.1–3.6)
LYMPHOCYTES RELATIVE PERCENT: 31.2 %
MEAN CORPUSCULAR HEMOGLOBIN CONC: 33.2 g/dL (ref 32.0–36.0)
MEAN CORPUSCULAR HEMOGLOBIN: 32 pg (ref 25.9–32.4)
MEAN CORPUSCULAR VOLUME: 96.4 fL — ABNORMAL HIGH (ref 77.6–95.7)
MEAN PLATELET VOLUME: 9 fL (ref 6.8–10.7)
MONOCYTES ABSOLUTE COUNT: 0.5 10*9/L (ref 0.3–0.8)
MONOCYTES RELATIVE PERCENT: 9.4 %
NEUTROPHILS ABSOLUTE COUNT: 3 10*9/L (ref 1.8–7.8)
NEUTROPHILS RELATIVE PERCENT: 51.6 %
PLATELET COUNT: 142 10*9/L — ABNORMAL LOW (ref 150–450)
RED BLOOD CELL COUNT: 3.94 10*12/L — ABNORMAL LOW (ref 3.95–5.13)
RED CELL DISTRIBUTION WIDTH: 15.1 % (ref 12.2–15.2)
WBC ADJUSTED: 5.7 10*9/L (ref 3.6–11.2)

## 2024-08-13 LAB — MAGNESIUM: MAGNESIUM: 1.5 mg/dL — ABNORMAL LOW (ref 1.6–2.6)

## 2024-08-13 LAB — TACROLIMUS LEVEL: TACROLIMUS BLOOD: 7.4 ng/mL

## 2024-08-13 LAB — EBV QUANTITATIVE PCR, BLOOD: EBV VIRAL LOAD RESULT: NOT DETECTED

## 2024-08-13 LAB — CMV DNA, QUANTITATIVE, PCR: CMV VIRAL LD: NOT DETECTED

## 2024-08-13 NOTE — Progress Notes (Signed)
 Bone Marrow Transplant and Cellular Therapy Program  Immunosuppressive Therapy Note    Patricia [name] is a 49 y.o. 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 Patricia [name] is currently day +62.    Concurrent CYP3A4 inhibitors:  - Letermovir   - Fluconazole  (on hold starting 12/13 due to elevated LFTs) restarted 07/22/24     Current dose: 0.5 mg PO BID    Goal tacrolimus  Level: 5-10 ng/mL    Resulted level: 7.4 ng/mL drawn at 9:27 AM    Lab Results   Component Value Date/Time    TACROLIMUS  7.4 08/13/2024 09:27 AM    TACROLIMUS  5.1 08/09/2024 11:53 AM    TACROLIMUS  7.2 08/06/2024 12:08 PM    TACROLIMUS  7.2 08/02/2024 11:33 AM    TACROLIMUS  10.1 06/27/2024 09:37 AM    TACROLIMUS  7.7 06/26/2024 08:21 AM       Lab Results   Component Value Date/Time    CREATININE 0.61 08/13/2024 09:27 AM    CREATININE 0.61 08/09/2024 11:53 AM    CREATININE 0.64 08/06/2024 12:08 PM      A/P  - tacrolimus  trough remains within therapeutic range   - SCr WNL and overall stable   - Tbili and AST/Alk Phos are WNL; ALT now back to normal range   - Plan to continue tacrolimus  to 0.5 mg PO BID and plan to recheck level and CMP at next clinic visit    We will continue to monitor levels.  Patient will be followed for changes in renal and hepatic function, toxicity, and efficacy.     Elizebeth Has, PharmD, BCOP  Clinical Pharmacist Practitioner, BMTCT

## 2024-08-13 NOTE — Progress Notes (Signed)
 Pt oriented to room and call bell. All procedures and care while in clinic explained and reviewed prior to implementation.  Pt encouraged to voice fears and concerns. Patient denies any questions or concerns at this time. Time spent 5 minutes.

## 2024-08-14 DIAGNOSIS — C9100 Acute lymphoblastic leukemia not having achieved remission: Principal | ICD-10-CM

## 2024-08-14 NOTE — Progress Notes (Signed)
 I saw and evaluated the patient, participating in the key portions of the service.  I reviewed the resident???s note.  I agree with the resident???s findings and plan.     Jettie Pagan, MD

## 2024-08-15 DIAGNOSIS — C9100 Acute lymphoblastic leukemia not having achieved remission: Principal | ICD-10-CM

## 2024-08-15 NOTE — Progress Notes (Signed)
 Patricia [name] 's Prevymis  shipment will be delayed as a result of a high copay.     I have reached out to the patient  at [phone] and communicated the delay. We will call the patient back to reschedule the delivery upon resolution. We have not confirmed the new delivery date.

## 2024-08-16 ENCOUNTER — Ambulatory Visit: Admit: 2024-08-16 | Discharge: 2024-08-17 | Payer: PRIVATE HEALTH INSURANCE

## 2024-08-16 ENCOUNTER — Other Ambulatory Visit: Admit: 2024-08-16 | Discharge: 2024-08-17 | Payer: PRIVATE HEALTH INSURANCE

## 2024-08-16 ENCOUNTER — Ambulatory Visit
Admit: 2024-08-16 | Discharge: 2024-08-17 | Payer: PRIVATE HEALTH INSURANCE | Attending: Primary Care | Primary: Primary Care

## 2024-08-16 DIAGNOSIS — Z8619 Personal history of other infectious and parasitic diseases: Principal | ICD-10-CM

## 2024-08-16 DIAGNOSIS — D84822 Immunocompromised state associated with stem cell transplant    (CMS-HCC): Principal | ICD-10-CM

## 2024-08-16 DIAGNOSIS — E1165 Type 2 diabetes mellitus with hyperglycemia: Principal | ICD-10-CM

## 2024-08-16 DIAGNOSIS — Z9481 Bone marrow transplant status: Principal | ICD-10-CM

## 2024-08-16 DIAGNOSIS — C9100 Acute lymphoblastic leukemia not having achieved remission: Principal | ICD-10-CM

## 2024-08-16 DIAGNOSIS — Z5181 Encounter for therapeutic drug level monitoring: Principal | ICD-10-CM

## 2024-08-16 DIAGNOSIS — Z9484 Stem cells transplant status: Principal | ICD-10-CM

## 2024-08-16 DIAGNOSIS — Z9189 Other specified personal risk factors, not elsewhere classified: Principal | ICD-10-CM

## 2024-08-16 DIAGNOSIS — Z794 Long term (current) use of insulin: Principal | ICD-10-CM

## 2024-08-16 LAB — COMPREHENSIVE METABOLIC PANEL
ALBUMIN: 4.2 g/dL (ref 3.4–5.0)
ALKALINE PHOSPHATASE: 76 U/L (ref 46–116)
ALT (SGPT): 49 U/L (ref 10–49)
ANION GAP: 12 mmol/L (ref 5–14)
AST (SGOT): 35 U/L — ABNORMAL HIGH (ref <=34)
BILIRUBIN TOTAL: 0.3 mg/dL (ref 0.3–1.2)
BLOOD UREA NITROGEN: 12 mg/dL (ref 9–23)
BUN / CREAT RATIO: 17
CALCIUM: 9.8 mg/dL (ref 8.7–10.4)
CHLORIDE: 106 mmol/L (ref 98–107)
CO2: 24 mmol/L (ref 20.0–31.0)
CREATININE: 0.7 mg/dL (ref 0.55–1.02)
EGFR CKD-EPI (2021) FEMALE: >90 mL/min/1.73m2 (ref >=60)
GLUCOSE RANDOM: 164 mg/dL (ref 70–179)
POTASSIUM: 3.8 mmol/L (ref 3.4–4.8)
PROTEIN TOTAL: 6.8 g/dL (ref 5.7–8.2)
SODIUM: 142 mmol/L (ref 135–145)

## 2024-08-16 LAB — TACROLIMUS LEVEL: TACROLIMUS BLOOD: 5.3 ng/mL

## 2024-08-16 LAB — CBC W/ AUTO DIFF
BASOPHILS ABSOLUTE COUNT: 0 10*9/L (ref 0.0–0.1)
BASOPHILS RELATIVE PERCENT: 0.9 %
EOSINOPHILS ABSOLUTE COUNT: 0.4 10*9/L (ref 0.0–0.5)
EOSINOPHILS RELATIVE PERCENT: 7 %
HEMATOCRIT: 37.6 % (ref 34.0–44.0)
HEMOGLOBIN: 12.3 g/dL (ref 11.3–14.9)
LYMPHOCYTES ABSOLUTE COUNT: 1.6 10*9/L (ref 1.1–3.6)
LYMPHOCYTES RELATIVE PERCENT: 31.3 %
MEAN CORPUSCULAR HEMOGLOBIN CONC: 32.7 g/dL (ref 32.0–36.0)
MEAN CORPUSCULAR HEMOGLOBIN: 31.7 pg (ref 25.9–32.4)
MEAN CORPUSCULAR VOLUME: 96.7 fL — ABNORMAL HIGH (ref 77.6–95.7)
MEAN PLATELET VOLUME: 9.3 fL (ref 6.8–10.7)
MONOCYTES ABSOLUTE COUNT: 0.5 10*9/L (ref 0.3–0.8)
MONOCYTES RELATIVE PERCENT: 9.7 %
NEUTROPHILS ABSOLUTE COUNT: 2.6 10*9/L (ref 1.8–7.8)
NEUTROPHILS RELATIVE PERCENT: 51.1 %
PLATELET COUNT: 142 10*9/L — ABNORMAL LOW (ref 150–450)
RED BLOOD CELL COUNT: 3.89 10*12/L — ABNORMAL LOW (ref 3.95–5.13)
RED CELL DISTRIBUTION WIDTH: 15.1 % (ref 12.2–15.2)
WBC ADJUSTED: 5 10*9/L (ref 3.6–11.2)

## 2024-08-16 LAB — HCG QUANTITATIVE, BLOOD: GONADOTROPIN, CHORIONIC (HCG) QUANT: 6.5 m[IU]/mL

## 2024-08-16 LAB — URINALYSIS WITH MICROSCOPY
BACTERIA: NONE SEEN /HPF
BILIRUBIN UA: NEGATIVE
BLOOD UA: NEGATIVE
GLUCOSE UA: NEGATIVE
HYALINE CASTS: 1 /LPF (ref 0–1)
KETONES UA: NEGATIVE
NITRITE UA: NEGATIVE
PH UA: 6 (ref 5.0–9.0)
PROTEIN UA: NEGATIVE
RBC UA: 2 /HPF (ref <=4)
SPECIFIC GRAVITY UA: 1.011 (ref 1.003–1.030)
SQUAMOUS EPITHELIAL: <1 /HPF (ref 0–5)
UROBILINOGEN UA: <2
WBC UA: 6 /HPF — ABNORMAL HIGH (ref 0–5)

## 2024-08-16 LAB — IGG: GAMMAGLOBULIN; IGG: 588 mg/dL — ABNORMAL LOW (ref 650–1600)

## 2024-08-16 LAB — MAGNESIUM: MAGNESIUM: 1.5 mg/dL — ABNORMAL LOW (ref 1.6–2.6)

## 2024-08-16 NOTE — Progress Notes (Signed)
 Bone Marrow Transplant Research Note:  Date: 08/16/24     Clinical Study:  [id number] - A Prospective, Observational Cohort Study of Participants at Risk for Chronic Graft-Versus-Host Disease in the [place]  (THRIVE)      Study Visit: Day 29/ visit 10 - End of Study     Today study coordinator spoke with [name] in clinic to complete the symptom score assessment. She stated that her remaining symptoms are back to her baseline. The patient's study stipend and travel reimbursement were dicussed. [name] had no other questions but was reminded if she did, she should ask me, Dr. Katarzyna Jamieson, or any of the study personnel/ investigators.      Symptom score for 08/16/24 is the following:  -Cough: mild (1)  -Shortness of breath: absent (0)  -Sputum production: mild (1)  -Chest pain: absent (0)  Total: 2

## 2024-08-16 NOTE — Progress Notes (Signed)
 BMT Clinic Follow-up    [name]  MRN: [id number]  Encounter date: 08/16/2024    Referring Physician: Estelle Toribio SAUNDERS, MD  BMT Attending MD: Dr. Jeaneen    Transplant Day: 17     Reason for Visit: Post allogeneic transplant follow up. This visit included intensive monitoring of high-risk medications/disease states given risk of severe myelosuppression, immunosuppression, infection, and other toxicities. Review included CBC, electrolytes and other blood tests, as well as direct assessment of the patient for symptoms suggesting toxicity.    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 Her transplant course was complicated by T2DM with steroid induced hyperglycemia (stable on current regimen at discharge), prolonged Qtc on admission (resolved with last Qtc 460 from 11/20), non-infectious diarrhea (resolved without recent need for imodium ). 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 Received most recent dose of IVIG on 11/26.     Interval History  Patricia [name] is a 49 y.o. 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 Patricia [name] is now s/p a matched unrelated donor stem cell transplant      On 07/16/24, Patricia [name] was diagnosed with parainfluenza and enrolled on PUL-042.  She received her first nebulizer treatment on 07/19/2024 and overnight developed fever and vomiting.  She withdrew from the trial on Monday, 07/22/2024 when she was seen in clinic.    Here today with her mom  Moved back home now   Specialty medication Letermovir  co-pay now $2500 month with co-pay card, has 1 week supply left. Looking into manufacturers assistance.  Dr. Michael also has been following her and Patricia [name] did send a message to let her know.  If unable to get will continue close monitoring of CMV, but if starts to trend up would need to initiate Valgancyclovir.   CMV has been undetected on Letermovir   Doing well today  Has some dry skin but no rashes, one spot on back of neck that itches  No oral or ocular symptoms  Eating and drinking fairly well with no GI symptoms and normal bowel movements  Congestion back to baseline. CXR not indicated. Symptom score minimal: cough 1 sputum 1, but notes these are her usual baseline symptoms. No longer on study, but still participating in study related tasks. Pregnancy test today consistent with post menopausal levels, negative for pregnancy  Has some muscle related pain      ID: Valtrex , Fluconazole , Dapsone . Letermovir   Chimerisms pending from 08/09/24  08/06/24: CMV and EBV not detected    Wt Readings from Last 12 Encounters:   08/16/24 73.9 kg (162 lb 14.7 oz)   08/13/24 74.1 kg (163 lb 5.8 oz)   08/13/24 74.1 kg (163 lb 5.8 oz)   08/09/24 73 kg (160 lb 15 oz)   08/06/24 73.4 kg (161 lb 13.1 oz)   08/02/24 73.4 kg (161 lb 13.1 oz)   07/30/24 73.8 kg (162 lb 11.2 oz)   07/24/24 72.2 kg (159 lb 2.8 oz)   07/22/24 75.1 kg (165 lb 9.1 oz)   07/19/24 74.2 kg (163 lb 9.3 oz)   07/17/24 76.2 kg (167 lb 15.9 oz)   07/16/24 76 kg (167 lb 8.8 oz)       Physical Exam:  There were no vitals taken for this visit.  Temp:  [36.7 ??C (98 ??F)] 36.7 ??C (98 ??F)  Pulse:  [87] 87  BP: (123)/(85) 123/85     80, Normal activity with effort; some signs  or symptoms of disease (ECOG equivalent 1)    General: No acute distress noted.   Central venous access: Port in place. Line clean, dry, intact. No erythema or drainage noted.   ENT: Moist mucous membranes. Oropharhynx without lesions, erythema or exudate. Wax noted to right ear.  Cardiovascular: Pulse normal rate, regularity and rhythm. S1 and S2 normal, without any murmur, rub, or gallop.  Lungs: Clear Good air movement.   Skin: Warm, dry, intact. No rash noted.    Psychiatry: Alert and oriented to person, place, and time.   Gastrointestinal/Abdomen: Normoactive bowel sounds, abdomen soft, non-tender   Musculoskeletal/Extremities: FROM throughout. No edema  Neurologic: CNII-XII intact. Normal strength and sensation throughout.           Acute GVHD Assessment          Most Recent Value    07/30/2024 - 08/16/2024 Allogeneic BMT Unknown Phase - Planned 06/12/2024-Post 1 Year    07/30/2024 13:23 Allogeneic BMT Unknown Phase - Planned 06/12/2024-Post 1 Year    08/02/2024 17:17 Allogeneic BMT Unknown Phase - Planned 06/12/2024-Post 1 Year    08/09/2024 13:28 Allogeneic BMT Unknown Phase - Planned 06/12/2024-Post 1 Year    08/16/2024 12:27   Acute GVHD Grade and Severity   Overall Grade (Przepiorka) 0  08/16/2024 0 0 0 0       Assessment/Plan:  1) SCT Summary:  - Type of Transplant: RIC MUD Allo (Fludarabine  40 mg/m2/Melphalan  100 mg/m2)  - Graft Source: Fresh PBSCs10/10, Female, ABO O-, CMV negative     2) Disease: ALL  - Pre BMT disease status: CR MRD Negative  - Signs/symptoms of disease relapse: None   - Restaging plan:    *Next BMBX: Day +90   *Next Chimerism: Day +60 (pending from 1/9)    Date Source Unfractionated CD3   07/12/24 PB >95% 93   08/09/24 PB         3) Immunosuppressed: High risk of infections, if patient develops a fever will admit to inpatient unit for IV antibiotics and fever workup   - Prophylaxis:          * Antiviral: Valtrex  500 mg po BID through 2 years post transplant.   Letermovir  480 mg po daily through day +200 to prevent CMV infections   * Antifungal: Fluconazole  400 mg po daily through day +75  * PJP: Sulfa allergic. Dapsone  daily upon platelet engraftment, has tolerated this well in the past. Toxo PCR negative.    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   Previously required Valcyte  treatment. 11/16 CMV viral load undetectable.  - Continue Letermovir  and Valtrex  BID through day +200 post SCT.   -Letermovir  co-pay with new year now $2000+ a month with co-pay card, looking into if she can get manufacturers assistance. She has one week supply left.   Will continue weekly monitoring if unanble to get supply will follow closely and pivot to valgancyclovir if she starts to trend up on PCRs    Recurrent Acute Sinusitis  Recurrent acute sinusitis with nasal congestion, frontal headache, and low-grade fever. Responded well to Augmentin .  -06/07/24 current sx resolving, started Augmentin  10 days (10/30-11/9)    Parainfluenza:  -12/16: congestion, cough, sinus pressure. RPP +parainfluenza. Consented to pulmotect study.   - 12/19: 1st nebulizer treatment on Pumotect study, plan for 2nd treatment 12/22 and 3rd treatment 12/23.   - 12/22: Developed fever and  N/V after 1st nebulizer. Per patient intolerance to nebulizer she has declined further doses and would like to withdraw from the study. Given increased albuterol  used CXR obtained which was clear. Continue with symptomatic treatment. Starting Agumentin x 10 days for possible secondary sinusitis. Sent screening cultures today due to fever of 101 over the weekend (likely secondary to Paraflu but will rule out bacteremia).   - 12/24: Cough is worsening today so we repeated a chest x-ray, which was clear.  Patricia [name] is not eligible for any further treatment on PUL-042.  Patricia [name] has been feeling better with intermittent albuterol  nebulizers so I prescribed additional nebulizer vials.  07/30/24: Recovering from parainfluenza with residual cough and mild dysphonia.   - Ordered chest x-ray and nasal respiratory swab for Friday visit.  - Continued albuterol  as needed.     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   Prophylaxis:  - Methotrexate  5 mg/m2 IVP on days +1, +3, +6 and +11  - Tacrolimus      * Goal trough: 5-10     * High risk drug monitoring and dose adjustments per BMT Pharmacist    * Signs/symptoms of toxicity: None      07/30/24:- Monitored for GVHD signs, especially with elevated eosinophils.  -08/06/24: Eosinophilia resolved.    5) Heme  Transfusion criteria: Transfuse 1 unit of PRBCs for hemoglobin <7 and 1 unit of platelets for Plt <10K or bleeding. [name] Witzke does not have a history of transfusion reactions. Consent was obtained and documented on 06/05/24.     6) GI/Liver:   GERD prophylaxis: Pepcid  BID    Nausea:PRN zofran  and compazine     Diarrhea: PRN Imodium     Elevated liver enzymes:  - Started trending up on 12/5 with mildly elevated AST  - On 12/9 both AST and ALT elevated- Dapsone  and Pepcid  held  - 12/12: AST 112 and ALT 172 normal bili. Will hold Fluconazole  until next visit to re-evaluate. Sending adeno and HHV-6. CMV and EBV have been negative.   - 12/16: AST improved, ALT stable. Like up from URI (tested positive for parainfluenza today). Possibly secondary medication (metformin , fluc, dapsone , and pepcid ). Fluc, dapsone , and pepcid  on hold. If LFts stable/improved next visit would restart fluconazole .   - 12/19: AST/ALT worsened today, likely secondary to parainfluenza. Repeat labs on Monday if LFTs stable/imrpoved plan to re-challenge fluc, if worsen will need to setup IV micafungin.   - 12/22: AST/ALT improving thus likely secondary to URI. Will rechallenge with fluconazole .   - 12/24: AST/ALT continue to improve.  I suspect this is resolution after URI.  Can resume dapsone .  --Now resolved     7) Hypomagnesemia:   Mag chelate 2 tabs TID    8)  Heart failure with mildly reduced ejection fraction (HFmrEF)   Followed by [facility]:  Due to anythracycline use  Hx of EF 45-50%, most recent Echo 05/23/24 improved to 55-60%  Hx of prolonged QTc  - Home med Losartan  50 mg daily (held 11/18)  BP Readings from Last 3 Encounters:   08/16/24 123/85 08/13/24 124/93   08/13/24 124/93      9) Pulm/Allergies   - cetirizine  10 mg daily   - albuterol  PRN     10) Menses suppression: Lupron  last given 07/26/22. No menses sense (peri-menopausal/menopausal)  - 12/19: screening urine pregnancy test per study resulted as positive, repeat urine test indeterminate. Serum beta HcG 5.9. Low level Hcg can often be seen in peri-menopausal women who are not pregnant. She has had chemotherapy and only 1 month post transplant so would consider this a negative pregnancy screen.      11) Type 2 DM exacerbated by steroids: Followed by [facility]. At home uses Dexcom for monitoring. Has follow up with Endocrinology on 07/05/24.   - Increase insulin  glargine from 12u to 15u once daily  - Continue insulin  aspart with meals per carb counting (1 unit for every 8 grams of carbs)  - Endocrine added Metformin  500 mg XR daily on 07/05/24. Next follow up 08/28/23    12) Pain:  - PRN tramadol  50 mg. No oxy as this give patient migraines     13) Anxiety:  Lexapro  10 mg daily (has PRN ativan  available but has not needed)  Reconnected with her local therapist      14) DispoCaregivers: Lives in [place], [place]  Primary caregiver: Jeanie Tate (mom)  Secondary caregiver: Payeton Germani (husband)    15) Bone Health: DEXA 07/09/24: Results with normal bone density    I personally spent 40 minutes face-to-face and non-face-to-face in the care of this patient, which includes all pre, intra, and post visit time on the date of service.  All documented time was specific to the E/M visit and does not include any procedures that may have been performed.  Time was spent on clinical assessment, placing orders / referrals, and/or prescription management, all pertaining to the diagnoses listed above.        Rosina MARLA Morse, ANP  [facility]      Future Appointments   Date Time Provider Department Center   08/20/2024  1:00 PM ONCBMT LABS [facility] 08/20/2024  2:00 PM ONCBMT APP A [facility]   08/23/2024 11:15 AM ONCBMT LABS [facility]   08/23/2024 12:00 PM ONCBMT APP A [facility]   08/27/2024  9:50 AM Kome, Arlean HERO, CPP UNCDIABENDET [facility]   08/27/2024 12:00 PM ONCBMT LABS [facility]   08/27/2024  1:00 PM ONCBMT APP B [facility]   08/30/2024 11:00 AM ONCBMT LABS [facility]   08/30/2024 12:00 PM ONCBMT APP B [facility]   09/03/2024  9:15 AM ONCBMT LABS [facility]   09/03/2024 10:00 AM ONCBMT APP B [facility]   09/05/2024 10:00 AM Jarold Motto T UNCDIABENDET [facility]   09/06/2024  9:15 AM ONCBMT LABS [facility]   09/06/2024 10:00 AM ONCBMT APP B [facility]   09/10/2024  9:00 AM ONCBMT LABS [facility]   09/10/2024 10:00 AM ONCBMT APP A [facility]   09/10/2024 11:00 AM Polly Alyce Dover, MD ONCMULTI [facility]   09/10/2024 12:00 PM ONCBMT PROCEDURES [id number] [facility]   09/17/2024  9:15 AM ONCBMT LABS [facility]   09/17/2024 10:00 AM [name], MD HONCBMT [facility]   09/17/2024 11:00 AM CARSER EKG [id number] [facility]   09/24/2024  8:15 AM ONCBMT LABS  [facility]ON[facility]   09/24/2024  9:00 AM ONCBMT APP A [facility]   09/24/2024 10:00 AM ONCBMT PHARMACY [facility]   09/24/2024 10:30 AM ONCBMT COORDINATOR [facility]   12/10/2024  9:30 AM ONCBMT LABS [facility]ON[facility]   12/10/2024 10:30 AM ONCBMT PHARMACY [facility]ON[facility]   12/27/2024  9:40 AM Storm Honer, MD UNCDIABENDET [facility]   01/28/2025 12:30 PM ET FL 2 ECHO RM 2 IMGECHOET [place] - ET   01/28/2025  1:25 PM Delores Fleeting, MD UNCHRTVASET [facility]

## 2024-08-16 NOTE — Progress Notes (Signed)
 [facility]  Immunosuppressive Therapy Note    Patricia [name] is a 49 y.o. 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 Patricia [name] is currently day +65.    Concurrent CYP3A4 inhibitors:  - Letermovir   - Fluconazole  (on hold starting 12/13 due to elevated LFTs) restarted 07/22/24     Current dose: 0.5 mg PO BID    Goal tacrolimus  Level: 5-10 ng/mL    Resulted level: 5.3 ng/mL drawn at 1055 AM    Lab Results   Component Value Date/Time    TACROLIMUS  5.3 08/16/2024 10:55 AM    TACROLIMUS  7.4 08/13/2024 09:27 AM    TACROLIMUS  5.1 08/09/2024 11:53 AM    TACROLIMUS  7.2 08/02/2024 11:33 AM    TACROLIMUS  10.1 06/27/2024 09:37 AM    TACROLIMUS  7.7 06/26/2024 08:21 AM       Lab Results   Component Value Date/Time    CREATININE 0.70 08/16/2024 10:55 AM    CREATININE 0.61 08/13/2024 09:27 AM    CREATININE 0.61 08/09/2024 11:53 AM      A/P  - Tacrolimus  trough remains within therapeutic range   - SCr WNL and overall stable   - Tbili and AST/Alk Phos are WNL; ALT now back to normal range but mild AST elevation  - Plan to continue tacrolimus  to 0.5 mg PO BID and plan to recheck level and CMP at next clinic visit    We will continue to monitor levels.  Patient will be followed for changes in renal and hepatic function, toxicity, and efficacy.     [name], PharmD, BCPS, BCOP, CPP  [facility] Cellular [facility]

## 2024-08-16 NOTE — Patient Instructions (Addendum)
 Counts look good today    Will look into the Letermovir  coverage, if you run out we will continue to follow CMV levels weekly and if trend up would need to change Valtrex  to Valgancyclovir    A pharmacist will call you if I need to make changes to your tacrolimus  dose.     -------------------------------------------------------------------------------  Return to clinic on Tuesday to see one of the providers. You will receive a time when you check out today.    Lab Results   Component Value Date    WBC 5.0 08/16/2024    HGB 12.3 08/16/2024    HCT 37.6 08/16/2024    PLT 142 (L) 08/16/2024     Lab Results   Component Value Date    NA 142 08/16/2024    K 3.8 08/16/2024    CL 106 08/16/2024    CO2 24.0 08/16/2024    BUN 12 08/16/2024    CREATININE 0.70 08/16/2024    GLU 164 08/16/2024    CALCIUM  9.8 08/16/2024    MG 1.5 (L) 08/16/2024    PHOS 4.5 06/26/2024     Lab Results   Component Value Date    BILITOT 0.3 08/16/2024    BILIDIR 0.10 06/26/2024    PROT 6.8 08/16/2024    ALBUMIN 4.2 08/16/2024    ALT 49 08/16/2024    AST 35 (H) 08/16/2024    ALKPHOS 76 08/16/2024    GGT 836 (H) 11/25/2023     Lab Results   Component Value Date    INR 1.15 06/26/2024    APTT 29.0 06/26/2024       For prescription refills:   For refills, please check your medication bottles to see if you have additional refills left. If so, please call your pharmacy and follow the directions to request a refill. If you do not have any refills left, please make a request during your clinic visit or by submitting a request through MyChart or by calling [phone]. Please allow 24 hours if your request is made during the week or 48 hours if requests are made on the weekends or holidays.     --------------------------------------------------------------------------------------------------------------------  For appointments & questions Monday through Friday 8 AM-4:30 PM     Please call [phone]     On Nights, Weekends and [place] [phone]. This is the nurse's station. They will contact the provider for you.     Please visit Privacyfever.cz, a resource created just for family members and caregivers.  This website lists support services, how and where to ask for help. It has tools to assist you as you help us  care for your loved one.    [facility]  [address]  [place], [place] [address]  [email or web address]

## 2024-08-17 DIAGNOSIS — C9100 Acute lymphoblastic leukemia not having achieved remission: Principal | ICD-10-CM

## 2024-08-19 DIAGNOSIS — C9100 Acute lymphoblastic leukemia not having achieved remission: Principal | ICD-10-CM

## 2024-08-20 ENCOUNTER — Other Ambulatory Visit: Admit: 2024-08-20 | Discharge: 2024-08-21 | Payer: PRIVATE HEALTH INSURANCE

## 2024-08-20 ENCOUNTER — Ambulatory Visit
Admit: 2024-08-20 | Discharge: 2024-08-21 | Payer: PRIVATE HEALTH INSURANCE | Attending: Critical Care Medicine | Primary: Critical Care Medicine

## 2024-08-20 DIAGNOSIS — Z9481 Bone marrow transplant status: Principal | ICD-10-CM

## 2024-08-20 DIAGNOSIS — Z5181 Encounter for therapeutic drug level monitoring: Principal | ICD-10-CM

## 2024-08-20 DIAGNOSIS — C9100 Acute lymphoblastic leukemia not having achieved remission: Principal | ICD-10-CM

## 2024-08-20 DIAGNOSIS — D696 Thrombocytopenia, unspecified: Principal | ICD-10-CM

## 2024-08-20 DIAGNOSIS — Z9189 Other specified personal risk factors, not elsewhere classified: Principal | ICD-10-CM

## 2024-08-20 LAB — COMPREHENSIVE METABOLIC PANEL
ALBUMIN: 4 g/dL (ref 3.4–5.0)
ALKALINE PHOSPHATASE: 73 U/L (ref 46–116)
ALT (SGPT): 35 U/L (ref 10–49)
ANION GAP: 12 mmol/L (ref 5–14)
AST (SGOT): 23 U/L (ref <=34)
BILIRUBIN TOTAL: 0.3 mg/dL (ref 0.3–1.2)
BLOOD UREA NITROGEN: 9 mg/dL (ref 9–23)
BUN / CREAT RATIO: 13
CALCIUM: 9.9 mg/dL (ref 8.7–10.4)
CHLORIDE: 106 mmol/L (ref 98–107)
CO2: 26 mmol/L (ref 20.0–31.0)
CREATININE: 0.67 mg/dL (ref 0.55–1.02)
EGFR CKD-EPI (2021) FEMALE: >90 mL/min/1.73m2 (ref >=60)
GLUCOSE RANDOM: 174 mg/dL (ref 70–179)
POTASSIUM: 3.9 mmol/L (ref 3.4–4.8)
PROTEIN TOTAL: 6.6 g/dL (ref 5.7–8.2)
SODIUM: 144 mmol/L (ref 135–145)

## 2024-08-20 LAB — CBC W/ AUTO DIFF
BASOPHILS ABSOLUTE COUNT: 0 10*9/L (ref 0.0–0.1)
BASOPHILS RELATIVE PERCENT: 0.7 %
EOSINOPHILS ABSOLUTE COUNT: 0.2 10*9/L (ref 0.0–0.5)
EOSINOPHILS RELATIVE PERCENT: 4.1 %
HEMATOCRIT: 37.4 % (ref 34.0–44.0)
HEMOGLOBIN: 12.3 g/dL (ref 11.3–14.9)
LYMPHOCYTES ABSOLUTE COUNT: 1.4 10*9/L (ref 1.1–3.6)
LYMPHOCYTES RELATIVE PERCENT: 27.4 %
MEAN CORPUSCULAR HEMOGLOBIN CONC: 32.9 g/dL (ref 32.0–36.0)
MEAN CORPUSCULAR HEMOGLOBIN: 31.7 pg (ref 25.9–32.4)
MEAN CORPUSCULAR VOLUME: 96.2 fL — ABNORMAL HIGH (ref 77.6–95.7)
MEAN PLATELET VOLUME: 9.2 fL (ref 6.8–10.7)
MONOCYTES ABSOLUTE COUNT: 0.4 10*9/L (ref 0.3–0.8)
MONOCYTES RELATIVE PERCENT: 8.4 %
NEUTROPHILS ABSOLUTE COUNT: 3.1 10*9/L (ref 1.8–7.8)
NEUTROPHILS RELATIVE PERCENT: 59.4 %
PLATELET COUNT: 142 10*9/L — ABNORMAL LOW (ref 150–450)
RED BLOOD CELL COUNT: 3.89 10*12/L — ABNORMAL LOW (ref 3.95–5.13)
RED CELL DISTRIBUTION WIDTH: 14.7 % (ref 12.2–15.2)
WBC ADJUSTED: 5.2 10*9/L (ref 3.6–11.2)

## 2024-08-20 LAB — EBV QUANTITATIVE PCR, BLOOD: EBV VIRAL LOAD RESULT: NOT DETECTED

## 2024-08-20 LAB — MAGNESIUM: MAGNESIUM: 1.6 mg/dL (ref 1.6–2.6)

## 2024-08-20 LAB — CMV DNA, QUANTITATIVE, PCR: CMV VIRAL LD: NOT DETECTED

## 2024-08-20 LAB — IGG: GAMMAGLOBULIN; IGG: 549 mg/dL — ABNORMAL LOW (ref 650–1600)

## 2024-08-20 NOTE — Patient Instructions (Signed)
 VISIT SUMMARY:  You had a routine follow-up visit today, 69 days after your bone marrow transplant. Your blood counts and organ functions are stable, and there are no signs of infection or graft-versus-host disease. We discussed your ongoing recovery and plans for future care.    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  -Continue ongoing surveillance for relapse.  -We discussed the decreasing relapse risk over time and the significant reduction at two years.  -You prefer to delay celebrating milestones until two years post-transplant, which we respect.  -Your next follow-up is scheduled for Friday.    STATUS POST ALLOGENEIC HEMATOPOIETIC STEM CELL TRANSPLANT: Your recovery post-transplant is progressing well with stable lab results and no signs of graft-versus-host disease.  -Your eosinophil count is below the threshold of concern for graft-versus-host disease.  -Continue good nutritional intake and monitor hair regrowth.  -Use silicone scar tape for the catheter site scar and avoid Neosporin.  -Fluconazole  discontinuation is planned for day 75.  -We discussed your vaccine schedule and transitioning to yearly visits at two years post-transplant.  -Your next follow-up is scheduled for Friday.

## 2024-08-20 NOTE — Progress Notes (Signed)
 BMT Clinic Follow-up    [name]  MRN: [id number]  Encounter date: 08/20/2024    Referring Physician: Estelle Toribio SAUNDERS, MD  BMT Attending MD: Dr. Jeaneen    Transplant Day: 59     Reason for Visit: Post allogeneic transplant follow up. This visit included intensive monitoring of high-risk medications/disease states given risk of severe myelosuppression, immunosuppression, infection, and other toxicities. Review included CBC, electrolytes and other blood tests, as well as direct assessment of the patient for symptoms suggesting toxicity.    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 Her transplant course was complicated by T2DM with steroid induced hyperglycemia (stable on current regimen at discharge), prolonged Qtc on admission (resolved with last Qtc 460 from 11/20), non-infectious diarrhea (resolved without recent need for imodium ). 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 Received most recent dose of IVIG on 11/26.     Interval History  Patricia [name] is a 49 y.o. 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 Patricia [name] is now s/p a matched unrelated donor stem cell transplant      On 07/16/24, Patricia [name] was diagnosed with parainfluenza and enrolled on PUL-042.  She received her first nebulizer treatment on 07/19/2024 and overnight developed fever and vomiting.  She withdrew from the trial on Monday, 07/22/2024 when she was seen in clinic.    Patricia [name] is a 49 year old female with ALL currently day 69 post allogeneic bone marrow transplant, presenting for routine post-transplant follow-up.    Constitutional and Gastrointestinal Symptoms:  - No fevers, chills, constitutional symptoms, or gastrointestinal complaints  - Tolerating oral intake well    Hematologic and Laboratory Findings:  - Platelet count stable at 142 on three consecutive checks  - Neutrophil count 3.1  - Hemoglobin 12.3  - White blood cell count 5.2  - Renal and hepatic function within normal limits  - Chimerism unchanged  - CMV undetectable    Respiratory Symptoms:  - Persistent dry cough, worsened by mask use and milder at home  - No new respiratory symptoms  - No infectious symptoms    Dermatologic Findings:  - Hair regrowth with mixed colors and persistent patchy loss of darker hairs  - Small depression at prior catheter site after scab removal  - Using herbal topical agents for scar management  - Advised to use silicone scar tape and avoid Neosporin due to risk of irritation and allergy    Graft-Versus-Host Disease Surveillance:  - Monitors laboratory values closely, including platelets and eosinophils (absolute 0.2), as markers for possible graft-versus-host disease      ID: Valtrex , Fluconazole , Dapsone . Letermovir   Chimerisms last 08/09/24  08/13/24: CMV and EBV not detected    Wt Readings from Last 12 Encounters:   08/20/24 74.8 kg (164 lb 14.5 oz)   08/16/24 73.9 kg (162 lb 14.7 oz)   08/13/24 74.1 kg (163 lb 5.8 oz)   08/13/24 74.1 kg (163 lb 5.8 oz)   08/09/24 73 kg (160 lb 15 oz)   08/06/24 73.4 kg (161 lb 13.1 oz)   08/02/24 73.4 kg (161 lb 13.1 oz)   07/30/24 73.8 kg (162 lb 11.2 oz)   07/24/24 72.2 kg (159 lb 2.8 oz)   07/22/24 75.1 kg (165 lb 9.1 oz)   07/19/24 74.2 kg (163 lb 9.3 oz)   07/17/24 76.2 kg (167 lb 15.9 oz)       Physical Exam:  BP 129/85  -  Pulse 87  - Resp 18  - Ht 167.6 cm (5' 6)  - Wt 74.8 kg (164 lb 14.5 oz)  - SpO2 97%  - BMI 26.62 kg/m??   Pulse:  [87] 87  BP: (129)/(85) 129/85     80, Normal activity with effort; some signs or symptoms of disease (ECOG equivalent 1)    General: No acute distress noted.   Central venous access: Port in place. Line clean, dry, intact. No erythema or drainage noted.   ENT: Moist mucous membranes. Oropharhynx without lesions, erythema or exudate. Wax noted to right ear.  Cardiovascular: Pulse normal rate, regularity and rhythm. S1 and S2 normal, without any murmur, rub, or gallop.  Lungs: Clear Good air movement.   Skin: Warm, dry, intact. No rash noted.    Psychiatry: Alert and oriented to person, place, and time.   Gastrointestinal/Abdomen: Normoactive bowel sounds, abdomen soft, non-tender   Musculoskeletal/Extremities: FROM throughout. No edema  Neurologic: CNII-XII intact. Normal strength and sensation throughout.              Acute GVHD Assessment          Most Recent Value    07/30/2024 - 08/20/2024 Allogeneic BMT Unknown Phase - Planned 06/12/2024-Post 1 Year    07/30/2024 13:23 Allogeneic BMT Unknown Phase - Planned 06/12/2024-Post 1 Year    08/02/2024 17:17 Allogeneic BMT Unknown Phase - Planned 06/12/2024-Post 1 Year    08/09/2024 13:28 Allogeneic BMT Unknown Phase - Planned 06/12/2024-Post 1 Year    08/16/2024 12:27 Allogeneic BMT Unknown Phase - Planned 06/12/2024-Post 1 Year    08/20/2024 14:42   Acute GVHD Grade and Severity   Overall Grade (Przepiorka) 0  08/20/2024 0 0 0 0 0       Assessment/Plan:  1) SCT Summary:  - Type of Transplant: RIC MUD Allo (Fludarabine  40 mg/m2/Melphalan  100 mg/m2)  - Graft Source: Fresh PBSCs10/10, Female, ABO O-, CMV negative     2) Disease: ALL  - Pre BMT disease status: CR MRD Negative  - Signs/symptoms of disease relapse: None   - Restaging plan:    *Next BMBX: Day +90   *Next Chimerism: Day +60 (pending from 1/9)    Date Source Unfractionated CD3   07/12/24 PB >95% 93   08/09/24 PB  >95% 93      3) Immunosuppressed: High risk of infections, if patient develops a fever will admit to inpatient unit for IV antibiotics and fever workup   - Prophylaxis:          * Antiviral: Valtrex  500 mg po BID through 2 years post transplant.   Letermovir  480 mg po daily through day +200 to prevent CMV infections   * Antifungal: Fluconazole  400 mg po daily through day +75  * PJP: Sulfa allergic. Dapsone  daily upon platelet engraftment, has tolerated this well in the past. Toxo PCR negative.    - Vaccines to begin at 6 months     - Prior infections Hx CMV viremia: Dr. 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   Previously required Valcyte  treatment. 11/16 CMV viral load undetectable.  - Continue Letermovir  and Valtrex  BID through day +200 post SCT.   -Letermovir  co-pay with new year now $2000+ a month with co-pay card, looking into if she can get manufacturers assistance. She has one week supply left.   Will continue weekly monitoring if unanble to get supply will follow closely and pivot to valgancyclovir if she starts to trend up on PCRs    Recurrent Acute Sinusitis  Recurrent acute sinusitis with nasal congestion, frontal headache, and low-grade fever. Responded well to Augmentin .  -06/07/24 current sx resolving, started Augmentin  10 days (10/30-11/9)    Parainfluenza:  -12/16: congestion, cough, sinus pressure. RPP +parainfluenza. Consented to pulmotect study.   - 12/19: 1st nebulizer treatment on Pumotect study, plan for 2nd treatment 12/22 and 3rd treatment 12/23.   - 12/22: Developed fever and N/V after 1st nebulizer. Per patient intolerance to nebulizer she has declined further doses and would like to withdraw from the study. Given increased albuterol  used CXR obtained which was clear. Continue with symptomatic treatment. Starting Agumentin x 10 days for possible secondary sinusitis. Sent screening cultures today due to fever of 101 over the weekend (likely secondary to Paraflu but will rule out bacteremia).   - 12/24: Cough is worsening today so we repeated a chest x-ray, which was clear.  [name] is not eligible for any further treatment on PUL-042.  Ms. Sivils has been feeling better with intermittent albuterol  nebulizers so I prescribed additional nebulizer vials.  07/30/24: Recovering from parainfluenza with residual cough and mild dysphonia.   - Ordered chest x-ray and nasal respiratory swab for Friday visit.  - Continued albuterol  as needed.     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   Prophylaxis:  - Methotrexate  5 mg/m2 IVP on days +1, +3, +6 and +11  - Tacrolimus      * Goal trough: 5-10     * High risk drug monitoring and dose adjustments per BMT Pharmacist    * Signs/symptoms of toxicity: None      07/30/24:- Monitored for GVHD signs, especially with elevated eosinophils.  -08/06/24: Eosinophilia resolved.    5) Heme  Transfusion criteria: Transfuse 1 unit of PRBCs for hemoglobin <7 and 1 unit of platelets for Plt <10K or bleeding. [name] Hanzlik does not have a history of transfusion reactions. Consent was obtained and documented on 06/05/24.     6) GI/Liver:   GERD prophylaxis: Pepcid  BID    Nausea:PRN zofran  and compazine     Diarrhea: PRN Imodium     Elevated liver enzymes:  - Started trending up on 12/5 with mildly elevated AST  - On 12/9 both AST and ALT elevated- Dapsone  and Pepcid  held  - 12/12: AST 112 and ALT 172 normal bili. Will hold Fluconazole  until next visit to re-evaluate. Sending adeno and HHV-6. CMV and EBV have been negative.   - 12/16: AST improved, ALT stable. Like up from URI (tested positive for parainfluenza today). Possibly secondary medication (metformin , fluc, dapsone , and pepcid ). Fluc, dapsone , and pepcid  on hold. If LFts stable/improved next visit would restart fluconazole .   - 12/19: AST/ALT worsened today, likely secondary to parainfluenza. Repeat labs on Monday if LFTs stable/imrpoved plan to re-challenge fluc, if worsen will need to setup IV micafungin.   - 12/22: AST/ALT improving thus likely secondary to URI. Will rechallenge with fluconazole .   - 12/24: AST/ALT continue to improve.  I suspect this is resolution after URI.  Can resume dapsone .  --  Now resolved     7) Hypomagnesemia:   Mag chelate 2 tabs TID    8)  Heart failure with mildly reduced ejection fraction (HFmrEF)   Followed by [facility] Cardiology:  Due to anythracycline use  Hx of EF 45-50%, most recent Echo 05/23/24 improved to 55-60%  Hx of prolonged QTc  - Home med Losartan  50 mg daily (held 11/18)    BP Readings from Last 3 Encounters:   08/20/24 129/85   08/16/24 123/85   08/13/24 124/93      9) Pulm/Allergies   - cetirizine  10 mg daily   - albuterol  PRN     10) Menses suppression: Lupron  last given 07/26/22. No menses sense (peri-menopausal/menopausal)  - 12/19: screening urine pregnancy test per study resulted as positive, repeat urine test indeterminate. Serum beta HcG 5.9. Low level Hcg can often be seen in peri-menopausal women who are not pregnant. She has had chemotherapy and only 1 month post transplant so would consider this a negative pregnancy screen.      11) Type 2 DM exacerbated by steroids: Followed by [facility]. At home uses Dexcom for monitoring. Has follow up with Endocrinology on 07/05/24.   - Increase insulin  glargine from 12u to 15u once daily  - Continue insulin  aspart with meals per carb counting (1 unit for every 8 grams of carbs)  - Endocrine added Metformin  500 mg XR daily on 07/05/24. Next follow up 08/28/23    12) Pain:  - PRN tramadol  50 mg. No oxy as this give patient migraines     13) Anxiety:  Lexapro  10 mg daily (has PRN ativan  available but has not needed)  Reconnected with her local therapist      14) DispoCaregivers: Lives in [place], [place]  Primary caregiver: Patricia [name] (mom)  Secondary caregiver: Saliyah Gillin (husband)    15) Bone Health: DEXA 07/09/24: Results with normal bone density    I personally spent 40 minutes face-to-face and non-face-to-face in the care of this patient, which includes all pre, intra, and post visit time on the date of service.  All documented time was specific to the E/M visit and does not include any procedures that may have been performed.  Time was spent on clinical assessment, placing orders / referrals, and/or prescription management, all pertaining to the diagnoses listed above.        Maralee PARAS Laurelyn Terrero, FNP  [facility]      Future Appointments   Date Time Provider Department Center   08/23/2024 11:15 AM ONCBMT LABS [facility]   08/23/2024 12:00 PM ONCBMT APP A [facility]   08/27/2024  9:50 AM Kome, Arlean HERO, CPP [facility]CDIABENDET [facility]   08/27/2024 12:00 PM ONCBMT LABS [facility]   08/27/2024  1:00 PM ONCBMT APP B [facility]   08/30/2024 11:00 AM ONCBMT LABS [facility]   08/30/2024 12:00 PM ONCBMT APP B [facility]   09/03/2024  9:15 AM ONCBMT LABS [facility]   09/03/2024 10:00 AM ONCBMT APP B [facility]   09/05/2024 10:00 AM Jarold Motto T UNCDIABENDET [facility]   09/05/2024 12:45 PM ONCINF CHAIR 09 HONC3UCA [facility]   09/06/2024  9:15 AM ONCBMT LABS [facility]   09/06/2024 10:00 AM ONCBMT APP B [facility]   09/10/2024  9:00 AM ONCBMT LABS [facility]   09/10/2024 10:00 AM ONCBMT APP A [facility]  09/10/2024 11:00 AM Polly Alyce Dover, MD ONCMULTI [facility]   09/10/2024 12:00 PM ONCBMT PROCEDURES HONC3UCA [facility]   09/17/2024  9:15 AM ONCBMT LABS [facility]   09/17/2024 10:00 AM [name], MD HONCBMT [facility]   09/17/2024 11:00 AM CARSER EKG [id number] [facility]   09/24/2024  8:15 AM ONCBMT LABS [facility]ON[facility]   09/24/2024  9:00 AM ONCBMT APP A [facility]   09/24/2024 10:00 AM ONCBMT PHARMACY [facility]   09/24/2024 10:30 AM ONCBMT COORDINATOR [facility]   12/10/2024  9:30 AM ONCBMT LABS [facility]   12/10/2024 10:30 AM ONCBMT PHARMACY [facility]   12/27/2024  9:40 AM Storm Honer, MD UNCDIABENDET [facility]   01/28/2025 12:30 PM ET FL 2 ECHO RM 2 IMGECHOET [place] - ET 01/28/2025  1:25 PM Delores Fleeting, MD UNCHRTVASET [facility]

## 2024-08-21 LAB — TACROLIMUS LEVEL: TACROLIMUS BLOOD: 3.5 ng/mL

## 2024-08-21 NOTE — Progress Notes (Signed)
 [facility] Transplant [facility] Cellular Therapy Program  Immunosuppressive Therapy Note    Patricia [name] is a 49 y.o. 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 Patricia [name] is currently day +70.    Concurrent CYP3A4 inhibitors:  - Letermovir   - Fluconazole  (on hold starting 12/13 due to elevated LFTs) restarted 07/22/24     Current dose: 0.5 mg PO BID    Goal tacrolimus  Level: 5-10 ng/mL    Resulted level: 3.5 ng/mL drawn 1/20 at 13:06 PM, resulted today    Lab Results   Component Value Date/Time    TACROLIMUS  3.5 08/20/2024 01:06 PM    TACROLIMUS  5.3 08/16/2024 10:55 AM    TACROLIMUS  7.4 08/13/2024 09:27 AM    TACROLIMUS  7.2 08/02/2024 11:33 AM    TACROLIMUS  10.1 06/27/2024 09:37 AM    TACROLIMUS  7.7 06/26/2024 08:21 AM       Lab Results   Component Value Date/Time    CREATININE 0.67 08/20/2024 01:06 PM    CREATININE 0.70 08/16/2024 10:55 AM    CREATININE 0.61 08/13/2024 09:27 AM      A/P  - Tacrolimus  trough likely remains within therapeutic range when accounting for afternoon draw time   - SCr WNL and overall stable   - Hepatic function panel now WNL (prior mild AST and ALT elevations)  - Plan to continue tacrolimus  to 0.5 mg PO BID and plan to recheck level and CMP at next clinic visit    We will continue to monitor levels.  Patient will be followed for changes in renal and hepatic function, toxicity, and efficacy.     [name], PharmD, BCPS, BCOP, CPP  [facility] Trans[facility] Cellular Therapy

## 2024-08-23 ENCOUNTER — Ambulatory Visit
Admit: 2024-08-23 | Discharge: 2024-08-24 | Payer: PRIVATE HEALTH INSURANCE | Attending: Primary Care | Primary: Primary Care

## 2024-08-23 ENCOUNTER — Other Ambulatory Visit: Admit: 2024-08-23 | Discharge: 2024-08-24 | Payer: PRIVATE HEALTH INSURANCE

## 2024-08-23 DIAGNOSIS — Z9481 Bone marrow transplant status: Principal | ICD-10-CM

## 2024-08-23 DIAGNOSIS — Z8619 Personal history of other infectious and parasitic diseases: Secondary | ICD-10-CM

## 2024-08-23 DIAGNOSIS — Z794 Long term (current) use of insulin: Secondary | ICD-10-CM

## 2024-08-23 DIAGNOSIS — C9100 Acute lymphoblastic leukemia not having achieved remission: Principal | ICD-10-CM

## 2024-08-23 DIAGNOSIS — Z9189 Other specified personal risk factors, not elsewhere classified: Secondary | ICD-10-CM

## 2024-08-23 DIAGNOSIS — R7989 Other specified abnormal findings of blood chemistry: Secondary | ICD-10-CM

## 2024-08-23 DIAGNOSIS — E1165 Type 2 diabetes mellitus with hyperglycemia: Secondary | ICD-10-CM

## 2024-08-23 DIAGNOSIS — D696 Thrombocytopenia, unspecified: Secondary | ICD-10-CM

## 2024-08-23 DIAGNOSIS — Z5181 Encounter for therapeutic drug level monitoring: Secondary | ICD-10-CM

## 2024-08-23 LAB — CBC W/ AUTO DIFF
BASOPHILS ABSOLUTE COUNT: 0.1 10*9/L (ref 0.0–0.1)
BASOPHILS RELATIVE PERCENT: 1.2 %
EOSINOPHILS ABSOLUTE COUNT: 0.2 10*9/L (ref 0.0–0.5)
EOSINOPHILS RELATIVE PERCENT: 3.9 %
HEMATOCRIT: 36.7 % (ref 34.0–44.0)
HEMOGLOBIN: 12.2 g/dL (ref 11.3–14.9)
LYMPHOCYTES ABSOLUTE COUNT: 1.2 10*9/L (ref 1.1–3.6)
LYMPHOCYTES RELATIVE PERCENT: 26.9 %
MEAN CORPUSCULAR HEMOGLOBIN CONC: 33.1 g/dL (ref 32.0–36.0)
MEAN CORPUSCULAR HEMOGLOBIN: 32 pg (ref 25.9–32.4)
MEAN CORPUSCULAR VOLUME: 96.6 fL — ABNORMAL HIGH (ref 77.6–95.7)
MEAN PLATELET VOLUME: 9.1 fL (ref 6.8–10.7)
MONOCYTES ABSOLUTE COUNT: 0.4 10*9/L (ref 0.3–0.8)
MONOCYTES RELATIVE PERCENT: 9.6 %
NEUTROPHILS ABSOLUTE COUNT: 2.7 10*9/L (ref 1.8–7.8)
NEUTROPHILS RELATIVE PERCENT: 58.4 %
PLATELET COUNT: 137 10*9/L — ABNORMAL LOW (ref 150–450)
RED BLOOD CELL COUNT: 3.8 10*12/L — ABNORMAL LOW (ref 3.95–5.13)
RED CELL DISTRIBUTION WIDTH: 14.6 % (ref 12.2–15.2)
WBC ADJUSTED: 4.6 10*9/L (ref 3.6–11.2)

## 2024-08-23 LAB — COMPREHENSIVE METABOLIC PANEL
ALBUMIN: 4.2 g/dL (ref 3.4–5.0)
ALKALINE PHOSPHATASE: 77 U/L (ref 46–116)
ALT (SGPT): 54 U/L — ABNORMAL HIGH (ref 10–49)
ANION GAP: 13 mmol/L (ref 5–14)
AST (SGOT): 36 U/L — ABNORMAL HIGH (ref <=34)
BILIRUBIN TOTAL: 0.3 mg/dL (ref 0.3–1.2)
BLOOD UREA NITROGEN: 11 mg/dL (ref 9–23)
BUN / CREAT RATIO: 17
CALCIUM: 9.4 mg/dL (ref 8.7–10.4)
CHLORIDE: 105 mmol/L (ref 98–107)
CO2: 25 mmol/L (ref 20.0–31.0)
CREATININE: 0.65 mg/dL (ref 0.55–1.02)
EGFR CKD-EPI (2021) FEMALE: >90 mL/min/{1.73_m2} (ref >=60)
GLUCOSE RANDOM: 221 mg/dL — ABNORMAL HIGH (ref 70–179)
POTASSIUM: 4.1 mmol/L (ref 3.4–4.8)
PROTEIN TOTAL: 6.4 g/dL (ref 5.7–8.2)
SODIUM: 143 mmol/L (ref 135–145)

## 2024-08-23 LAB — MAGNESIUM: MAGNESIUM: 1.6 mg/dL (ref 1.6–2.6)

## 2024-08-23 LAB — TACROLIMUS LEVEL: TACROLIMUS BLOOD: 4.1 ng/mL

## 2024-08-23 NOTE — Progress Notes (Addendum)
 BMT Clinic Follow-up    [name]  MRN: [id number]  Encounter date: 08/23/2024    Referring Physician: Estelle Toribio SAUNDERS, MD  BMT Attending MD: Dr. Jeaneen    Transplant Day: 49     Reason for Visit: Post allogeneic transplant follow up. This visit included intensive monitoring of high-risk medications/disease states given risk of severe myelosuppression, immunosuppression, infection, and other toxicities. Review included CBC, electrolytes and other blood tests, as well as direct assessment of the patient for symptoms suggesting toxicity.    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 Her transplant course was complicated by T2DM with steroid induced hyperglycemia (stable on current regimen at discharge), prolonged Qtc on admission (resolved with last Qtc 460 from 11/20), non-infectious diarrhea (resolved without recent need for imodium ). 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 Received most recent dose of IVIG on 11/26.     Interval History  Patricia [name] is a 49 y.o. 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 Patricia [name] is now s/p a matched unrelated donor stem cell transplant      On 07/16/24, Patricia [name] was diagnosed with parainfluenza and enrolled on PUL-042.  She received her first nebulizer treatment on 07/19/2024 and overnight developed fever and vomiting.  She withdrew from the trial on Monday, 07/22/2024 when she was seen in clinic.    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    Had a fall yesterday got tripped up coming into her house and fell on left arm some bruises and back pain. Did not hit her head.   Would like to change to alternative mag formula when she runs out of chelate as it is more expensive for her, discussed to let us  know but changing over to magnesium  glycinate  Will complete fluconazole  on Monday  Otherwise feeling well  Eating and drinking well  No issues with nausea or vomiting or diarrhea  No skin rashes, no dry eyes or dry mouth  Afebrile no infectious symptoms      ID: Valtrex , Fluconazole , Dapsone . Letermovir  (has one week left on letermovir  then will just monitor CMV )   Chimerisms last 08/09/24  08/13/24: CMV and EBV not detected    Wt Readings from Last 12 Encounters:   08/23/24 73.8 kg (162 lb 11.2 oz)   08/20/24 74.8 kg (164 lb 14.5 oz)   08/16/24 73.9 kg (162 lb 14.7 oz)   08/13/24 74.1 kg (163 lb 5.8 oz)   08/13/24 74.1 kg (163 lb 5.8 oz)   08/09/24 73 kg (160 lb 15 oz)   08/06/24 73.4 kg (161 lb 13.1 oz)   08/02/24 73.4 kg (161 lb 13.1 oz)   07/30/24 73.8 kg (162 lb 11.2 oz)   07/24/24 72.2 kg (159 lb 2.8 oz)   07/22/24 75.1 kg (165 lb 9.1 oz)   07/19/24 74.2 kg (163 lb 9.3 oz)       Physical Exam:  BP 120/92  - Pulse 76  - Temp 36.7 ??C (98.1 ??F) (Temporal)  - Resp 16  - Ht 167.6 cm (5' 6)  - Wt 73.8 kg (162 lb 11.2 oz)  - SpO2 98%  - BMI 26.26 kg/m??   Temp:  [36.7 ??C (98.1 ??F)] 36.7 ??C (98.1 ??F)  Pulse:  [76] 76  BP: (120)/(92) 120/92     80, Normal activity with effort;  some signs or symptoms of disease (ECOG equivalent 1)    General: No acute distress noted.   Central venous access: Port in place. Line clean, dry, intact. No erythema or drainage noted.   ENT: Moist mucous membranes. Oropharhynx without lesions, erythema or exudate. Wax noted to right ear.  Cardiovascular: Pulse normal rate, regularity and rhythm. S1 and S2 normal, without any murmur, rub, or gallop.  Lungs: Clear Good air movement.   Skin: Warm, dry, intact. No rash noted.    Psychiatry: Alert and oriented to person, place, and time.   Gastrointestinal/Abdomen: Normoactive bowel sounds, abdomen soft, non-tender   Musculoskeletal/Extremities: FROM throughout. No edema  Neurologic: CNII-XII intact. Normal strength and sensation throughout.              Acute GVHD Assessment Most Recent Value    07/30/2024 - 08/23/2024 Allogeneic BMT Unknown Phase - Planned 06/12/2024-Post 1 Year    07/30/2024 13:23 Allogeneic BMT Unknown Phase - Planned 06/12/2024-Post 1 Year    08/02/2024 17:17 Allogeneic BMT Unknown Phase - Planned 06/12/2024-Post 1 Year    08/09/2024 13:28 Allogeneic BMT Unknown Phase - Planned 06/12/2024-Post 1 Year    08/16/2024 12:27 Allogeneic BMT Unknown Phase - Planned 06/12/2024-Post 1 Year    08/20/2024 14:42   Acute GVHD Grade and Severity   Overall Grade (Przepiorka) 0  08/20/2024 0 0 0 0 0       Assessment/Plan:  1) SCT Summary:  - Type of Transplant: RIC MUD Allo (Fludarabine  40 mg/m2/Melphalan  100 mg/m2)  - Graft Source: Fresh PBSCs10/10, Female, ABO O-, CMV negative     2) Disease: ALL  - Pre BMT disease status: CR MRD Negative  - Signs/symptoms of disease relapse: None   - Restaging plan:    *Next BMBX: Day +90   *Next Chimerism: Day 90    Date Source Unfractionated CD3   07/12/24 PB >95% 93   08/09/24 PB  >95% 93      3) Immunosuppressed: High risk of infections, if patient develops a fever will admit to inpatient unit for IV antibiotics and fever workup   - Prophylaxis:          * Antiviral: Valtrex  500 mg po BID through 2 years post transplant.   Letermovir  480 mg po daily through day +200 to prevent CMV infections   * Antifungal: Fluconazole  400 mg po daily through day +75  * PJP: Sulfa allergic. Dapsone  daily upon platelet engraftment, has tolerated this well in the past. Toxo PCR negative.    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   Previously required Valcyte  treatment. 11/16 CMV viral load undetectable.  - Continue Letermovir  and Valtrex  BID through day +200 post SCT.   -Letermovir  co-pay with new year now $2000+ a month with co-pay card, looking into if she can get manufacturers assistance. She has one week supply left.   Will continue weekly monitoring if unanble to get supply will follow closely and pivot to valgancyclovir if she starts to trend up on PCRs    Recurrent Acute Sinusitis  Recurrent acute sinusitis with nasal congestion, frontal headache, and low-grade fever. Responded well to Augmentin .  -06/07/24 current sx resolving, started Augmentin  10 days (10/30-11/9)    Parainfluenza:  -12/16: congestion, cough, sinus pressure. RPP +parainfluenza. Consented to pulmotect study.   - 12/19: 1st nebulizer treatment on Pumotect study, plan for 2nd treatment 12/22 and 3rd treatment  12/23.   - 12/22: Developed fever and N/V after 1st nebulizer. Per patient intolerance to nebulizer she has declined further doses and would like to withdraw from the study. Given increased albuterol  used CXR obtained which was clear. Continue with symptomatic treatment. Starting Agumentin x 10 days for possible secondary sinusitis. Sent screening cultures today due to fever of 101 over the weekend (likely secondary to Paraflu but will rule out bacteremia).   - 12/24: Cough is worsening today so we repeated a chest x-ray, which was clear.  Patricia [name] is not eligible for any further treatment on PUL-042.  Patricia [name] has been feeling better with intermittent albuterol  nebulizers so I prescribed additional nebulizer vials.  07/30/24: Recovering from parainfluenza with residual cough and mild dysphonia.   - Ordered chest x-ray and nasal respiratory swab for Friday visit.  - Continued albuterol  as needed.     Hypogammaglobulinemia   - Monthly IVIG (started 07/26/22 with CMV viremia and recurrent URI's) - last dose on 06/26/24   - Check monthly IgG levels   - IgG 769 on 12/30  - She was receiving this monthly prior to SCT, per ICID keep monitoring monthly and give prn if levels <400.    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   Prophylaxis:  - Methotrexate  5 mg/m2 IVP on days +1, +3, +6 and +11  - Tacrolimus      * Goal trough: 5-10     * High risk drug monitoring and dose adjustments per BMT Pharmacist    * Signs/symptoms of toxicity: None      07/30/24:- Monitored for GVHD signs, especially with elevated eosinophils.  -08/06/24: Eosinophilia resolved.    5) Heme  Transfusion criteria: Transfuse 1 unit of PRBCs for hemoglobin <7 and 1 unit of platelets for Plt <10K or bleeding. Patricia [name] does not have a history of transfusion reactions. Consent was obtained and documented on 06/05/24.     6) GI/Liver:   GERD prophylaxis: Pepcid  BID    Nausea:PRN zofran  and compazine     Diarrhea: PRN Imodium     Elevated liver enzymes:  - Started trending up on 12/5 with mildly elevated AST  - On 12/9 both AST and ALT elevated- Dapsone  and Pepcid  held  - 12/12: AST 112 and ALT 172 normal bili. Will hold Fluconazole  until next visit to re-evaluate. Sending adeno and HHV-6. CMV and EBV have been negative.   - 12/16: AST improved, ALT stable. Like up from URI (tested positive for parainfluenza today). Possibly secondary medication (metformin , fluc, dapsone , and pepcid ). Fluc, dapsone , and pepcid  on hold. If LFts stable/improved next visit would restart fluconazole .   - 12/19: AST/ALT worsened today, likely secondary to parainfluenza. Repeat labs on Monday if LFTs stable/imrpoved plan to re-challenge fluc, if worsen will need to setup IV micafungin.   - 12/22: AST/ALT improving thus likely secondary to URI. Will rechallenge with fluconazole .   - 12/24: AST/ALT continue to improve.  I suspect this is resolution after URI.  Can resume dapsone .  --Now resolved     7) Hypomagnesemia:   Mag chelate 2 tabs TID    8)  Heart failure with mildly reduced ejection fraction (HFmrEF)   Followed by [facility]iology:  Due to anythracycline use  Hx of EF 45-50%, most recent Echo 05/23/24 improved to 55-60%  Hx of prolonged QTc  - Home med Losartan  50 mg daily (held 11/18)  BP Readings from Last 3 Encounters:   08/23/24 120/92   08/20/24 129/85   08/16/24 123/85      9) Pulm/Allergies   - cetirizine  10 mg daily   - albuterol  PRN     10) Menses suppression: Lupron  last given 07/26/22. No menses sense (peri-menopausal/menopausal)  - 12/19: screening urine pregnancy test per study resulted as positive, repeat urine test indeterminate. Serum beta HcG 5.9. Low level Hcg can often be seen in peri-menopausal women who are not pregnant. She has had chemotherapy and only 1 month post transplant so would consider this a negative pregnancy screen.      11) Type 2 DM exacerbated by steroids: Followed by [facility] End[facility]. At home uses Dexcom for monitoring. Has follow up with Endocrinology on 07/05/24.   - Increase insulin  glargine from 12u to 15u once daily  - Continue insulin  aspart with meals per carb counting (1 unit for every 8 grams of carbs)  - Endocrine added Metformin  500 mg XR daily on 07/05/24. Next follow up 08/28/23    12) Pain:  - PRN tramadol  50 mg. No oxy as this give patient migraines     13) Anxiety:  Lexapro  10 mg daily (has PRN ativan  available but has not needed)  Reconnected with her local therapist      14) DispoCaregivers: Lives in [place], [place]  Primary caregiver: Jeanie Tate (mom)  Secondary caregiver: Rubina Basinski (husband)    15) Bone Health: DEXA 07/09/24: Results with normal bone density    I personally spent 40 minutes face-to-face and non-face-to-face in the care of this patient, which includes all pre, intra, and post visit time on the date of service.  All documented time was specific to the E/M visit and does not include any procedures that may have been performed.  Time was spent on clinical assessment, placing orders / referrals, and/or prescription management, all pertaining to the diagnoses listed above.        Rosina MARLA Morse, ANP  [facility]      Future Appointments   Date Time Provider Department Center   08/23/2024 12:00 PM Morse Rosina Neptune, ANP HONCBMT [facility]   08/27/2024  9:50 AM Sanda Arlean HERO, CPP UNCDIABENDET [facility]   08/27/2024 12:00 PM ONCBMT LABS [facility]   08/27/2024  1:00 PM ONCBMT APP B [facility]   08/30/2024 11:00 AM ONCBMT LABS [facility]   08/30/2024 12:00 PM ONCBMT APP B [facility]   09/03/2024  9:15 AM ONCBMT LABS [facility]   09/03/2024 10:00 AM ONCBMT APP B [facility]   09/05/2024 10:00 AM Jarold Motto T UNCDIABENDET [facility]   09/05/2024 12:45 PM ONCINF CHAIR 09 HONC3UCA [facility]   09/06/2024  9:15 AM ONCBMT LABS [facility]   09/06/2024 10:00 AM ONCBMT APP B [facility]   09/10/2024  9:00 AM ONCBMT LABS [facility]   09/10/2024 10:00 AM ONCBMT APP A [facility]   09/10/2024 11:00 AM Polly Alyce Dover, MD ONCMULTI [facility]   09/10/2024 12:00 PM ONCBMT PROCEDURES HONC3UCA [facility]   09/17/2024  9:15 AM ONCBMT LABS [facility]ON[facility]   09/17/2024 10:00 AM [name], MD [facility]ON[facility]MT [facility]   09/17/2024 11:00 AM CARSER EKG [id number] [facility]   09/24/2024  8:15 AM ONCBMT LABS HONCBMT [facility]   09/24/2024  9:00 AM ONCBMT APP A [facility]  09/24/2024 10:00 AM ONCBMT PHARMACY [facility]   09/24/2024 10:30 AM ONCBMT COORDINATOR [facility]ON[facility]MT [facility]   12/10/2024  9:30 AM ONCBMT LABS [facility]   12/10/2024 10:30 AM ONCBMT PHARMACY [facility]ON[facility]   12/27/2024  9:40 AM Storm Honer, MD UNCDIABENDET [facility]   01/28/2025 12:30 PM ET FL 2 ECHO RM 2 IMGECHOET Miranda - ET   01/28/2025  1:25 PM Delores Fleeting, MD UNCHRTVASET [facility]

## 2024-08-23 NOTE — Progress Notes (Signed)
 [facility] Trans[facility] Cellular Therapy Program  Immunosuppressive Therapy Note    Patricia [name] is a 49 y.o. 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 [name] is currently day +72.    Concurrent CYP3A4 inhibitors:  - Letermovir   - Fluconazole  (on hold starting 12/13 due to elevated LFTs) restarted 07/22/24     Current dose: 0.5 mg PO BID    Goal tacrolimus  Level: 5-10 ng/mL    Resulted level: 4.1 ng/mL drawn at 11:11 AM, and estimated true trough > 5     Lab Results   Component Value Date/Time    TACROLIMUS  4.1 08/23/2024 11:11 AM    TACROLIMUS  3.5 08/20/2024 01:06 PM    TACROLIMUS  5.3 08/16/2024 10:55 AM    TACROLIMUS  7.2 08/02/2024 11:33 AM    TACROLIMUS  10.1 06/27/2024 09:37 AM    TACROLIMUS  7.7 06/26/2024 08:21 AM       Lab Results   Component Value Date/Time    CREATININE 0.65 08/23/2024 11:11 AM    CREATININE 0.67 08/20/2024 01:06 PM    CREATININE 0.70 08/16/2024 10:55 AM      A/P  - Tacrolimus  trough remains within therapeutic range when accounting for late morning draw time   - SCr WNL and overall stable   - Hepatic function panel now WNL (prior mild AST and ALT elevations)  - Plan to continue tacrolimus  to 0.5 mg PO BID and plan to recheck level and CMP at next clinic visit    We will continue to monitor levels.  Patient will be followed for changes in renal and hepatic function, toxicity, and efficacy.     Elizebeth Has, PharmD, BCOP  Clinical Pharmacist Practitioner, BMTCT

## 2024-08-23 NOTE — Patient Instructions (Signed)
 Plan to complete fluconazole  on Monday     A pharmacist will call you if I need to make changes to your tacrolimus  dose.     -------------------------------------------------------------------------------  Return to clinic on Tuesday to see one of the providers. You will receive a time when you check out today.    Lab Results   Component Value Date    WBC 4.6 08/23/2024    HGB 12.2 08/23/2024    HCT 36.7 08/23/2024    PLT 137 (L) 08/23/2024     Lab Results   Component Value Date    NA 143 08/23/2024    K 4.1 08/23/2024    CL 105 08/23/2024    CO2 25.0 08/23/2024    BUN 11 08/23/2024    CREATININE 0.65 08/23/2024    GLU 221 (H) 08/23/2024    CALCIUM  9.4 08/23/2024    MG 1.6 08/23/2024    PHOS 4.5 06/26/2024     Lab Results   Component Value Date    BILITOT 0.3 08/23/2024    BILIDIR 0.10 06/26/2024    PROT 6.4 08/23/2024    ALBUMIN 4.2 08/23/2024    ALT 54 (H) 08/23/2024    AST 36 (H) 08/23/2024    ALKPHOS 77 08/23/2024    GGT 836 (H) 11/25/2023     Lab Results   Component Value Date    INR 1.15 06/26/2024    APTT 29.0 06/26/2024       For prescription refills:   For refills, please check your medication bottles to see if you have additional refills left. If so, please call your pharmacy and follow the directions to request a refill. If you do not have any refills left, please make a request during your clinic visit or by submitting a request through MyChart or by calling [phone]. Please allow 24 hours if your request is made during the week or 48 hours if requests are made on the weekends or holidays.     --------------------------------------------------------------------------------------------------------------------  For appointments & questions Monday through Friday 8 AM-4:30 PM     Please call [phone]     On Nights, Weekends and [employer] [phone]. This is the nurse's station. They will contact the provider for you.     Please visit Privacyfever.cz, a resource created just for family members and caregivers.  This website lists support services, how and where to ask for help. It has tools to assist you as you help us  care for your loved one.    [facility]  [address]  [place], [place] [address]  [email or web address]

## 2024-08-26 DIAGNOSIS — C9100 Acute lymphoblastic leukemia not having achieved remission: Principal | ICD-10-CM

## 2024-08-27 NOTE — Progress Notes (Signed)
 BMT Clinic Follow-up    [name]  MRN: [id number]  Encounter date: 08/28/2024    Referring Physician: Estelle Toribio SAUNDERS, MD  BMT Attending MD: Dr. Jeaneen    Transplant Day: 33     Reason for Visit: Post allogeneic transplant follow up. This visit included intensive monitoring of high-risk medications/disease states given risk of severe myelosuppression, immunosuppression, infection, and other toxicities. Review included CBC, electrolytes and other blood tests, as well as direct assessment of the patient for symptoms suggesting toxicity.    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 Her transplant course was complicated by T2DM with steroid induced hyperglycemia (stable on current regimen at discharge), prolonged Qtc on admission (resolved with last Qtc 460 from 11/20), non-infectious diarrhea (resolved without recent need for imodium ). 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 Received most recent dose of IVIG on 11/26.     Interval History  Patricia [name] is a 49 y.o. 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 [name] is now s/p a matched unrelated donor stem cell transplant.    On 07/16/24, [name] was diagnosed with parainfluenza and enrolled on PUL-042.  She received her first nebulizer treatment on 07/19/2024 and overnight developed fever and vomiting.  She withdrew from the trial on Monday, 07/22/2024 when she was seen in clinic.    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  She reports doing well. Denies new complains.   Good stamina. Remains active.   Has been eating and drinking well. No issues with nausea or vomiting or diarrhea.  No skin rashes, no dry eyes or dry mouth.  Afebrile no infectious symptoms.     She completed fluconazole  on Monday (D+76).  Would like to change to alternative magnesium  formula when she runs out of chelate as it is more expensive for her, discussed to let us  know but changing over to magnesium  glycinate.     ID: Valtrex , Dapsone . Letermovir  (has one week left on letermovir  then will just monitor CMV).    Chimerisms last 08/09/24.  Has one week of Prevymis  left  will switch to pre-emptive therapy.   Mg 2 tabs TID.   08/13/24: CMV and EBV not detected.    Physical Exam:  BP 132/93  - Pulse 91  - Temp 37.2 ??C (98.9 ??F) (Temporal)  - Resp 17  - Ht 167.6 cm (5' 6)  - Wt 73.8 kg (162 lb 11.2 oz)  - SpO2 97%  - BMI 26.26 kg/m??       80, Normal activity with effort; some signs or symptoms of disease (ECOG equivalent 1)    General: No acute distress noted.   Central venous access: Port in place. Line clean, dry, intact. No erythema or drainage noted.   ENT: Moist mucous membranes. Oropharhynx without lesions, erythema or exudate. Wax noted to right ear.  Cardiovascular: Pulse normal rate, regularity and rhythm. S1 and S2 normal, without any murmur, rub, or gallop.  Lungs: Clear Good air movement.   Skin: Warm, dry, intact. No rash noted.    Psychiatry: Alert and oriented to person, place, and time.   Gastrointestinal/Abdomen: Normoactive bowel sounds, abdomen soft, non-tender.  Musculoskeletal/Extremities: FROM throughout. No edema.  Neurologic: CNII-XII intact. Normal strength and sensation throughout.    Acute GVHD Assessment          Most Recent  Value    07/30/2024 - 08/27/2024 Allogeneic BMT Unknown Phase - Planned 06/12/2024-Post 1 Year    07/30/2024 13:23 Allogeneic BMT Unknown Phase - Planned 06/12/2024-Post 1 Year    08/02/2024 17:17 Allogeneic BMT Unknown Phase - Planned 06/12/2024-Post 1 Year    08/09/2024 13:28 Allogeneic BMT Unknown Phase - Planned 06/12/2024-Post 1 Year    08/16/2024 12:27 Allogeneic BMT Unknown Phase - Planned 06/12/2024-Post 1 Year    08/20/2024 14:42   Acute GVHD Grade and Severity   Overall Grade (Przepiorka) 0  08/20/2024 0 0 0 0 0 Assessment:  1) SCT Summary:  - Type of Transplant: RIC MUD Allo (Fludarabine  40 mg/m2/Melphalan  100 mg/m2)  - Graft Source: Fresh PBSCs10/10, Female, ABO O-, CMV negative     2) Disease: ALL  - Pre BMT disease status: CR MRD Negative  - Signs/symptoms of disease relapse: None   - Restaging plan:    *Next BMBX: Day +90   *Next Chimerism: Day +90    Date Source Unfractionated CD3   07/12/24 PB >95% 93   08/09/24 PB  >95% 93      3) Immunosuppressed: High risk of infections, if patient develops a fever will admit to inpatient unit for IV antibiotics and fever workup.   - Prophylaxis:          * Antiviral: Valtrex  500 mg po BID through 2 years post transplant.   Letermovir  480 mg po daily through day +200 to prevent CMV infections   * Antifungal: Fluconazole  400 mg po daily through day +75  * PJP: Sulfa allergic. Dapsone  daily upon platelet engraftment, has tolerated this well in the past. Toxo PCR negative.    - Vaccines to begin at 6 months     - Prior infections:  Hx CMV viremia: Dr. 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   Previously required Valcyte  treatment. 11/16 CMV viral load undetectable.  - Continue Letermovir  and Valtrex  BID through day +200 post SCT.   -Letermovir  co-pay with new year now $2000+ a month with co-pay card, looking into if she can get manufacturers assistance. She has one week supply left.   Will continue weekly monitoring if unanble to get supply will follow closely and pivot to valgancyclovir if she starts to trend up on PCRs    Recurrent Acute Sinusitis  Recurrent acute sinusitis with nasal congestion, frontal headache, and low-grade fever. Responded well to Augmentin .  -06/07/24 current sx resolving, started Augmentin  10 days (10/30-11/9)    Parainfluenza:  -12/16: congestion, cough, sinus pressure. RPP +parainfluenza. Consented to pulmotect study.   - 12/19: 1st nebulizer treatment on Pumotect study, plan for 2nd treatment 12/22 and 3rd treatment 12/23.   - 12/22: Developed fever and N/V after 1st nebulizer. Per patient intolerance to nebulizer she has declined further doses and would like to withdraw from the study. Given increased albuterol  used CXR obtained which was clear. Continue with symptomatic treatment. Starting Agumentin x 10 days for possible secondary sinusitis. Sent screening cultures today due to fever of 101 over the weekend (likely secondary to Paraflu but will rule out bacteremia).   - 12/24: Cough is worsening today so we repeated a chest x-ray, which was clear.  Ms. Munoz is not eligible for any further treatment on PUL-042.  Ms. Christman has been feeling better with intermittent albuterol  nebulizers so I prescribed additional nebulizer vials.  07/30/24: Recovering from parainfluenza with residual cough and mild dysphonia.   - Ordered chest x-ray and nasal respiratory swab for Friday visit.  - Fully  recovered. Continues albuterol  as needed.     Hypogammaglobulinemia   - Monthly IVIG (started 07/26/22 with CMV viremia and recurrent URI's) - last dose on 06/26/24   - Check monthly IgG levels   - IgG 769 on 12/30  - She was receiving this monthly prior to SCT, per ICID keep monitoring monthly and give prn if levels <400.    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   Prophylaxis:  - Methotrexate  5 mg/m2 IVP on days +1, +3, +6 and +11  - Tacrolimus      * Goal trough: 5-10     * High risk drug monitoring and dose adjustments per BMT Pharmacist    * Signs/symptoms of toxicity: None      - 07/30/24: Monitoring for GVHD signs, especially with elevated eosinophils.  - 08/06/24: Eosinophilia resolved.    5) Heme  Transfusion criteria: Transfuse 1 unit of PRBCs for hemoglobin <7 and 1 unit of platelets for Plt <10K or bleeding. [name] Pietro does not have a history of transfusion reactions. Consent was obtained and documented on 06/05/24.     6) GI/Liver:   GERD prophylaxis: Pepcid  BID    Nausea:PRN zofran  and compazine     Diarrhea: PRN Imodium     Elevated liver enzymes:  - Started trending up on 12/5 with mildly elevated AST  - On 12/9 both AST and ALT elevated- Dapsone  and Pepcid  held  - 12/12: AST 112 and ALT 172 normal bili. Will hold Fluconazole  until next visit to re-evaluate. Sending adeno and HHV-6. CMV and EBV have been negative.   - 12/16: AST improved, ALT stable. Like up from URI (tested positive for parainfluenza today). Possibly secondary medication (metformin , fluc, dapsone , and pepcid ). Fluc, dapsone , and pepcid  on hold. If LFts stable/improved next visit would restart fluconazole .   - 12/19: AST/ALT worsened today, likely secondary to parainfluenza. Repeat labs on Monday if LFTs stable/imrpoved plan to re-challenge fluc, if worsen will need to setup IV micafungin.   - 12/22: AST/ALT improving thus likely secondary to URI. Will rechallenge with fluconazole .   - 12/24: AST/ALT continue to improve.  I suspect this is resolution after URI.  Can resume dapsone .  --Now resolved     7) Hypomagnesemia:   Mag chelate 2 tabs TID    8)  Heart failure with mildly reduced ejection fraction (HFmrEF)   Followed by [facility]:  Due to anythracycline use  Hx of EF 45-50%, most recent Echo 05/23/24 improved to 55-60%  Hx of prolonged QTc  - Home med Losartan  50 mg daily (held 11/18)    BP Readings from Last 3 Encounters:   08/23/24 120/92   08/20/24 129/85   08/16/24 123/85      9) Pulm/Allergies   - cetirizine  10 mg daily   - albuterol  PRN     10) Menses suppression: Lupron  last given 07/26/22. No menses sense (peri-menopausal/menopausal)  - 12/19: screening urine pregnancy test per study resulted as positive, repeat urine test indeterminate. Serum beta HcG 5.9. Low level Hcg can often be seen in peri-menopausal women who are not pregnant. She has had chemotherapy and only 1 month post transplant so would consider this a negative pregnancy screen.      11) Type 2 DM exacerbated by steroids: Followed by [facility]. At home uses Dexcom for monitoring. Has follow up with Endocrinology on 07/05/24.   - Increase insulin  glargine  from 12u to 15u once daily  - Continue insulin  aspart with meals per carb counting (1 unit for every 8 grams of carbs)  - Endocrine added Metformin  500 mg XR daily on 07/05/24. Next follow up 08/28/23    12) Pain:  - PRN tramadol  50 mg. No oxy as this give patient migraines     13) Anxiety:  Lexapro  10 mg daily (has PRN ativan  available but has not needed)  Reconnected with her local therapist      14) DispoCaregivers: Lives in [place], [place]  Primary caregiver: Jeanie Tate (mom)  Secondary caregiver: Marieme Mcmackin (husband)    15) Bone Health: DEXA 07/09/24: Results with normal bone density    Plan: D+76. Doing very well.   Good engraftment.  1/09 DNA chimerism >95% donor in unfx and 93% donor in CD3+.   Recheck next week, ordered to be checked q 4 weeks.   **Disease monitoring PCR BCR-ABL p190 q 4 weeks. Ordered.    If remains negative, consider maintenance dasatinib  70 mg or ponatinib  15 mg every day. Relapse occurred on ponatinib , several months after swicth from dasatinib  to ponatinib  and appeared to have been driven by CNS disease. BCR-ABL1 mutation analysis at the time of relapse was negative. Dasatinib  may be better maintenance/prophylaxis in this case.    **No e/o GVHD.   Plan to start tac taper Day 90. Current dose 0.5 mg BID.   Taper schema:   - D+90: decrease dose to 0.5 mg every day.   - D+120: decrease to 0.5 mg every other day  - D+150: consider stopping.   ID prophylaxis as above.     Salihah Peckham J Jennavieve Arrick, MD  [facility] Bone Marrow Transplant and Cellular Therapy Program      Future Appointments   Date Time Provider Department Center   08/28/2024  1:45 PM ONCBMT LABS [facility]   08/28/2024  3:00 PM [name], MD [facility]   08/30/2024 11:00 AM ONCBMT LABS [facility]   08/30/2024 12:00 PM ONCBMT APP B [facility]   09/03/2024  9:15 AM ONCBMT LABS [facility]   09/03/2024 10:00 AM ONCBMT APP B [facility]   09/05/2024 10:00 AM Jarold Motto T UNCDIABENDET [facility]   09/06/2024  9:15 AM ONCBMT LABS [facility]   09/06/2024 10:00 AM ONCBMT APP B [facility]   09/10/2024  9:00 AM ONCBMT LABS [facility]   09/10/2024 10:00 AM ONCBMT APP A [facility]   09/10/2024 11:00 AM Polly Alyce Dover, MD ONCMULTI [facility]   09/10/2024 12:00 PM ONCBMT PROCEDURES [id number] [facility]   09/17/2024  9:15 AM ONCBMT LABS [facility]ON[facility]   09/17/2024 10:00 AM [name], MD [facility]ON[facility]MT [facility]   09/17/2024 11:00 AM CARSER EKG [id number] [facility]   09/24/2024  8:15 AM ONCBMT LABS [facility]ON[facility]   09/24/2024  9:00 AM ONCBMT APP A [facility]   09/24/2024 10:00 AM ONCBMT PHARMACY [facility]   09/24/2024 10:30 AM ONCBMT COORDINATOR [facility]ON[facility]   12/10/2024  9:30 AM ONCBMT LABS [facility]ON[facility]MT [facility]   12/10/2024 10:30 AM ONCBMT PHARMACY HONCBMT [facility]   12/27/2024  9:40 AM Storm Honer, MD UNCDIABENDET [facility]   01/28/2025 12:30 PM ET FL 2 ECHO RM 2 IMGECHOET [place] - ET   01/28/2025  1:25 PM Delores Fleeting, MD UNCHRTVASET [facility]

## 2024-08-28 ENCOUNTER — Ambulatory Visit: Admit: 2024-08-28 | Discharge: 2024-08-29 | Payer: PRIVATE HEALTH INSURANCE

## 2024-08-28 ENCOUNTER — Other Ambulatory Visit: Admit: 2024-08-28 | Discharge: 2024-08-29 | Payer: PRIVATE HEALTH INSURANCE

## 2024-08-28 DIAGNOSIS — C9100 Acute lymphoblastic leukemia not having achieved remission: Principal | ICD-10-CM

## 2024-08-28 DIAGNOSIS — Z9481 Bone marrow transplant status: Principal | ICD-10-CM

## 2024-08-28 LAB — COMPREHENSIVE METABOLIC PANEL
ALBUMIN: 4.4 g/dL (ref 3.4–5.0)
ALKALINE PHOSPHATASE: 79 U/L (ref 46–116)
ALT (SGPT): 52 U/L — ABNORMAL HIGH (ref 10–49)
ANION GAP: 12 mmol/L (ref 5–14)
AST (SGOT): 27 U/L (ref <=34)
BILIRUBIN TOTAL: 0.3 mg/dL (ref 0.3–1.2)
BLOOD UREA NITROGEN: 10 mg/dL (ref 9–23)
BUN / CREAT RATIO: 16
CALCIUM: 9.6 mg/dL (ref 8.7–10.4)
CHLORIDE: 105 mmol/L (ref 98–107)
CO2: 25 mmol/L (ref 20.0–31.0)
CREATININE: 0.61 mg/dL (ref 0.55–1.02)
EGFR CKD-EPI (2021) FEMALE: >90 mL/min/{1.73_m2} (ref >=60)
GLUCOSE RANDOM: 201 mg/dL — ABNORMAL HIGH (ref 70–179)
POTASSIUM: 4 mmol/L (ref 3.4–4.8)
PROTEIN TOTAL: 6.7 g/dL (ref 5.7–8.2)
SODIUM: 142 mmol/L (ref 135–145)

## 2024-08-28 LAB — CBC W/ AUTO DIFF
BASOPHILS ABSOLUTE COUNT: 0 10*9/L (ref 0.0–0.1)
BASOPHILS RELATIVE PERCENT: 0.8 %
EOSINOPHILS ABSOLUTE COUNT: 0.1 10*9/L (ref 0.0–0.5)
EOSINOPHILS RELATIVE PERCENT: 2.7 %
HEMATOCRIT: 38.1 % (ref 34.0–44.0)
HEMOGLOBIN: 12.7 g/dL (ref 11.3–14.9)
LYMPHOCYTES ABSOLUTE COUNT: 1.2 10*9/L (ref 1.1–3.6)
LYMPHOCYTES RELATIVE PERCENT: 26.2 %
MEAN CORPUSCULAR HEMOGLOBIN CONC: 33.3 g/dL (ref 32.0–36.0)
MEAN CORPUSCULAR HEMOGLOBIN: 31.8 pg (ref 25.9–32.4)
MEAN CORPUSCULAR VOLUME: 95.5 fL (ref 77.6–95.7)
MEAN PLATELET VOLUME: 9.4 fL (ref 6.8–10.7)
MONOCYTES ABSOLUTE COUNT: 0.5 10*9/L (ref 0.3–0.8)
MONOCYTES RELATIVE PERCENT: 10.2 %
NEUTROPHILS ABSOLUTE COUNT: 2.8 10*9/L (ref 1.8–7.8)
NEUTROPHILS RELATIVE PERCENT: 60.1 %
PLATELET COUNT: 144 10*9/L — ABNORMAL LOW (ref 150–450)
RED BLOOD CELL COUNT: 3.99 10*12/L (ref 3.95–5.13)
RED CELL DISTRIBUTION WIDTH: 14.5 % (ref 12.2–15.2)
WBC ADJUSTED: 4.7 10*9/L (ref 3.6–11.2)

## 2024-08-28 LAB — MAGNESIUM: MAGNESIUM: 1.6 mg/dL (ref 1.6–2.6)

## 2024-08-28 LAB — IGG: GAMMAGLOBULIN; IGG: 542 mg/dL — ABNORMAL LOW (ref 650–1600)

## 2024-08-28 LAB — CMV DNA, QUANTITATIVE, PCR: CMV VIRAL LD: NOT DETECTED

## 2024-08-28 LAB — EBV QUANTITATIVE PCR, BLOOD: EBV VIRAL LOAD RESULT: NOT DETECTED

## 2024-08-28 NOTE — Patient Instructions (Signed)
 Today you received your next set of immunizations which includes:  Haemophilus influenza B  Hepatitis B  Diphtheria/Tetanus/Pertussis  Inactivated polio  Pneumococcal vaccine

## 2024-08-28 NOTE — Progress Notes (Signed)
 Referral - Documentation

## 2024-08-28 NOTE — Progress Notes (Signed)
 Outpatient BMT SW Note: SW met with pt in clinic today. Pt inquired if she is eligible for any other financial assistance to help with medical bills. SW offered blameless apology, but states there are no other financial assistance options available at this time.    SW inquired if she had received the post [employer] grant. Pt reports she has not received this grant yet.    SW called [employer] [phone]) to follow up on grant status. SW spoke to Heather  and reviewed the reason for her call. Heather  offered to have one of her co-workers from [employer] department call SW back to try and further assist.     SW provided Heather  with her contact information and thanked her for assistance.    SW will follow up.

## 2024-08-29 DIAGNOSIS — C9100 Acute lymphoblastic leukemia not having achieved remission: Principal | ICD-10-CM

## 2024-08-29 LAB — TACROLIMUS LEVEL: TACROLIMUS BLOOD: 5.1 ng/mL

## 2024-08-29 MED ORDER — DASATINIB 70 MG TABLET
ORAL_TABLET | Freq: Every day | ORAL | 0 refills | 30.00000 days | Status: CP
Start: 2024-08-29 — End: ?

## 2024-08-29 NOTE — Progress Notes (Signed)
 Outpatient BMT SW Note: SW received a VM from [place] with [employer] team. She stated she would look into situation.    SW called [employer] team this morning to follow up. Lonell was unavailable at the time of call, but SW was able to speak to another [employer] rep-Leanna.    SW was told that due to the fact, pt had a recent last name change and pt had elected to receive a debit card with the award amount, that route can take up to 4 weeks for clients to receive.    SW called pt and made sure she was aware.    SW encouraged her to call her back if she didn't receive the card by the end of the week next week.    Pt was appreciative.

## 2024-08-29 NOTE — Progress Notes (Signed)
 Bone Marrow Transplant and Cellular Therapy Program  Immunosuppressive Therapy Note    Patricia [name] is a 49 y.o. 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 [name] is currently day +78.    Concurrent CYP3A4 inhibitors:  - Letermovir   - Fluconazole  (on hold starting 12/13 due to elevated LFTs) restarted 07/22/24 - stopped on 08/26/24 (day +75)    Current dose: 0.5 mg PO BID    Goal tacrolimus  Level: 5-10 ng/mL    Resulted level: 5.2 ng/mL drawn yesterday at 2:12 PM and resulted today, and estimated true trough closer to mid-range    Lab Results   Component Value Date/Time    TACROLIMUS  5.1 08/28/2024 02:12 PM    TACROLIMUS  4.1 08/23/2024 11:11 AM    TACROLIMUS  3.5 08/20/2024 01:06 PM    TACROLIMUS  7.2 08/02/2024 11:33 AM    TACROLIMUS  10.1 06/27/2024 09:37 AM    TACROLIMUS  7.7 06/26/2024 08:21 AM       Lab Results   Component Value Date/Time    CREATININE 0.61 08/28/2024 02:12 PM    CREATININE 0.65 08/23/2024 11:11 AM    CREATININE 0.67 08/20/2024 01:06 PM      A/P  - Tacrolimus  trough remains therapeutic and it is closer to mid-range when accounting for early afternoon draw time   - SCr WNL and overall stable   - Hepatic function panel now WNL (prior mild AST and ALT elevations)  - Plan to continue tacrolimus  to 0.5 mg PO BID and plan to recheck level and CMP at next clinic visit    We will continue to monitor levels.  Patient will be followed for changes in renal and hepatic function, toxicity, and efficacy.     Elizebeth Has, PharmD, BCOP  Clinical Pharmacist Practitioner, BMTCT

## 2024-08-29 NOTE — Progress Notes (Signed)
 BMT Clinic Follow-up    [name]  MRN: [id number]  Encounter date: 08/30/2024    Referring Physician: Estelle Toribio SAUNDERS, MD  BMT Attending MD: Dr. Jeaneen    Transplant Day: 54     Reason for Visit: Post allogeneic transplant follow up. This visit included intensive monitoring of high-risk medications/disease states given risk of severe myelosuppression, immunosuppression, infection, and other toxicities. Review included CBC, electrolytes and other blood tests, as well as direct assessment of the patient for symptoms suggesting toxicity.    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 Her transplant course was complicated by T2DM with steroid induced hyperglycemia (stable on current regimen at discharge), prolonged Qtc on admission (resolved with last Qtc 460 from 11/20), non-infectious diarrhea (resolved without recent need for imodium ). 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 Received most recent dose of IVIG on 11/26.     Interval History  LARENDA [name] is a 49 y.o. 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 Patricia is now s/p a matched unrelated donor stem cell transplant.    On 07/16/24, Ms. [name] was diagnosed with parainfluenza and enrolled on PUL-042.  She received her first nebulizer treatment on 07/19/2024 and overnight developed fever and vomiting.  She withdrew from the trial on Monday, 07/22/2024 when she was seen in clinic.    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    No nausea, vomiting, or diarrhea    Eating well. Fluid intake fluctuates, some days does better than others.     BS have been overall stable, mostly <200. Continues on SSI and lantus  15 units nightly.     Has some dry/red patches at base of fingers. Not itchy. No other areas of rash. Has history of eczema and feels this is similar to her prior episodes. Using moisturizer lotion.     Has recovered from prior URI. Does get some drainage when she wakes up.     No fevers, chills, or infectious symptoms.     No dry eyes.     Occasional dry mouth if has not drank enough.      ID: Valtrex , Dapsone . Letermovir  (has one week left on letermovir  then will just monitor CMV).    Start Tac taper at day +90, ~09/14/24 (see instructions at end of note for taper plan)  Has 2 days of Prevymis  left. Awaiting manufacturere assistance.   Mg 2 tabs TID. She has a few days of this left then will switch to Mag Oxide 400mg  BID (cheaper than the mg chelate).   08/28/24: CMV and EBV not detected.    Physical Exam:  BP 111/92  - Pulse 84  - Temp 36.7 ??C (98.1 ??F) (Temporal)  - Resp 16  - Ht 167.6 cm (5' 6)  - Wt 73.9 kg (162 lb 14.7 oz)  - SpO2 98%  - BMI 26.30 kg/m??   Temp:  [36.7 ??C (98.1 ??F)] 36.7 ??C (98.1 ??F)  Pulse:  [84] 84  BP: (111)/(92) 111/92   80, Normal activity with effort; some signs or symptoms of disease (ECOG equivalent 1)    General: No acute distress noted.   Central venous access: Right chest port not accessed. No overlying erythema or tenderness.   ENT: Moist mucous membranes. Oropharhynx without lesions, erythema or exudate.   Cardiovascular: Pulse normal rate, regularity and rhythm. S1  and S2 normal, without any murmur, rub, or gallop.  Lungs: Clear to auscultation bilaterally, without wheezes/crackles/rhonchi. Good air movement.   Skin: Warm, dry, intact. Small patches of eczema on base of fingers bilaterally. No other rashes.    Psychiatry: Alert and oriented to person, place, and time.   Gastrointestinal/Abdomen: Normoactive bowel sounds, abdomen soft, non-tender   Musculoskeletal/Extremities: FROM throughout. No edema  Neurologic: CNII-XII intact. Normal strength and sensation throughout    Acute GVHD Assessment          Most Recent Value    08/02/2024 - 08/30/2024 Allogeneic BMT Unknown Phase - Planned 06/12/2024-Post 1 Year    08/02/2024 17:17 Allogeneic BMT Unknown Phase - Planned 06/12/2024-Post 1 Year    08/09/2024 13:28 Allogeneic BMT Unknown Phase - Planned 06/12/2024-Post 1 Year    08/16/2024 12:27 Allogeneic BMT Unknown Phase - Planned 06/12/2024-Post 1 Year    08/20/2024 14:42 Allogeneic BMT Unknown Phase - Planned 06/12/2024-Post 1 Year    08/30/2024 12:48   Acute GVHD Grade and Severity   Overall Grade (Przepiorka) 0  08/30/2024 0 0 0 0 0     Chronic GVHD Assessment          Most Recent Value    08/30/2024 - 08/30/2024 Allogeneic BMT Unknown Phase - Planned 06/12/2024-Post 1 Year    08/30/2024 12:48   Chronic GVHD Grade and Severity   Chronic Graft-Versus-Host Disease Global Severity Score 0  08/30/2024 0   Total Chronic Graft-Versus-Host Disease Score 0  08/30/2024 0       Assessment:  1) SCT Summary:  - Type of Transplant: RIC MUD Allo (Fludarabine  40 mg/m2/Melphalan  100 mg/m2)  - Graft Source: Fresh PBSCs10/10, Female, ABO O-, CMV negative     2) Disease: ALL  - Pre BMT disease status: CR MRD Negative  - Signs/symptoms of disease relapse: None   - Restaging plan:    *Next BMBX: Day +90   *Next Chimerism: Day +90    Date Source Unfractionated CD3   07/12/24 PB >95% 93   08/09/24 PB  >95% 93      3) Immunosuppressed: High risk of infections, if patient develops a fever will admit to inpatient unit for IV antibiotics and fever workup.   - Prophylaxis:          * Antiviral: Valtrex  500 mg po BID through 2 years post transplant.   Letermovir  480 mg po daily through day +200 to prevent CMV infections    *1/30, supply runs out 2/1 and current cost is >$2000 a month. Looking into manufacturer assistance as below  * Antifungal: Fluconazole  400 mg po daily through day +75  * PJP: Sulfa allergic. Dapsone  daily upon platelet engraftment, has tolerated this well in the past. Toxo PCR negative.    - Vaccines to begin at 6 months     - Prior infections:  Hx CMV viremia: Dr. 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   Previously required Valcyte  treatment. 11/16 CMV viral load undetectable.  - Continue Letermovir  and Valtrex  BID through day +200 post SCT.   -Letermovir  co-pay with new year now $2000+ a month with co-pay card, looking into if she can get manufacturers assistance. She has 2 day supply left, ok to come off once runs out and can restart if gets manufacturer assistance.   Will continue weekly monitoring if unanble to get supply will follow closely and pivot to valgancyclovir if she starts to trend up on PCRs    Recurrent Acute Sinusitis  Recurrent acute sinusitis with  nasal congestion, frontal headache, and low-grade fever. Responded well to Augmentin .  -06/07/24 current sx resolving, started Augmentin  10 days (10/30-11/9)    Parainfluenza:  -12/16: congestion, cough, sinus pressure. RPP +parainfluenza. Consented to pulmotect study.   - 12/19: 1st nebulizer treatment on Pumotect study, plan for 2nd treatment 12/22 and 3rd treatment 12/23.   - 12/22: Developed fever and N/V after 1st nebulizer. Per patient intolerance to nebulizer she has declined further doses and would like to withdraw from the study. Given increased albuterol  used CXR obtained which was clear. Continue with symptomatic treatment. Starting Agumentin x 10 days for possible secondary sinusitis. Sent screening cultures today due to fever of 101 over the weekend (likely secondary to Paraflu but will rule out bacteremia).   - 12/24: Cough is worsening today so we repeated a chest x-ray, which was clear.  Ms. Mitten is not eligible for any further treatment on PUL-042.  Ms. Stennett has been feeling better with intermittent albuterol  nebulizers so I prescribed additional nebulizer vials.  07/30/24: Recovering from parainfluenza with residual cough and mild dysphonia.   - Ordered chest x-ray and nasal respiratory swab for Friday visit.  - Now recovered. Continues albuterol  PRN if gets cough from dry air.     Hypogammaglobulinemia   - Monthly IVIG (started 07/26/22 with CMV viremia and recurrent URI's) - last dose on 06/26/24   - Check monthly IgG levels   - IgG 769 on 12/30  - She was receiving this monthly prior to SCT, per ICID keep monitoring monthly and give prn if levels <400.    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   Prophylaxis:  - Methotrexate  5 mg/m2 IVP on days +1, +3, +6 and +11  - Tacrolimus      * Goal trough: 5-10     * High risk drug monitoring and dose adjustments per BMT Pharmacist    * Signs/symptoms of toxicity: None      - 07/30/24: Monitoring for GVHD signs, especially with elevated eosinophils.  - 08/06/24: Eosinophilia resolved.    5) Heme  Transfusion criteria: Transfuse 1 unit of PRBCs for hemoglobin <7 and 1 unit of platelets for Plt <10K or bleeding. [name] Mack does not have a history of transfusion reactions. Consent was obtained and documented on 06/05/24.     6) GI/Liver:   GERD prophylaxis: Pepcid  BID    Nausea:PRN zofran  and compazine , not currently needing     Diarrhea: PRN Imodium , not currently needing.     Elevated liver enzymes:  - Started trending up on 12/5 with mildly elevated AST  - On 12/9 both AST and ALT elevated- Dapsone  and Pepcid  held  - 12/12: AST 112 and ALT 172 normal bili. Will hold Fluconazole  until next visit to re-evaluate. Sending adeno and HHV-6. CMV and EBV have been negative.   - 12/16: AST improved, ALT stable. Like up from URI (tested positive for parainfluenza today). Possibly secondary medication (metformin , fluc, dapsone , and pepcid ). Fluc, dapsone , and pepcid  on hold. If LFts stable/improved next visit would restart fluconazole .   - 12/19: AST/ALT worsened today, likely secondary to parainfluenza. Repeat labs on Monday if LFTs stable/imrpoved plan to re-challenge fluc, if worsen will need to setup IV micafungin.   - 12/22: AST/ALT improving thus likely secondary to URI. Will rechallenge with fluconazole .   - 12/24: AST/ALT continue to improve.  I suspect this is resolution after  URI.  Can resume dapsone .  --Now just mildly elevated ALT but improving.      7) Hypomagnesemia:   Mag chelate 2 tabs TID, this has become too costly and she was able to find Mag Oxide cheaper. Will transition to Mag Oxide 400mg  BID once uses up current supply of Mag Chelate.     8)  Heart failure with mildly reduced ejection fraction (HFmrEF)   Followed by [facility]:  Due to anythracycline use  Hx of EF 45-50%, most recent Echo 05/23/24 improved to 55-60%  Hx of prolonged QTc  - Home med Losartan  50 mg daily (held 11/18)    BP Readings from Last 3 Encounters:   08/30/24 111/92   08/28/24 132/93   08/23/24 120/92      9) Pulm/Allergies   - cetirizine  10 mg daily   - albuterol  PRN     10) Menses suppression: Lupron  last given 07/26/22. No menses sense (peri-menopausal/menopausal)  - 12/19: screening urine pregnancy test per study resulted as positive, repeat urine test indeterminate. Serum beta HcG 5.9. Low level Hcg can often be seen in peri-menopausal women who are not pregnant. She has had chemotherapy and only 1 month post transplant so would consider this a negative pregnancy screen.      11) Type 2 DM exacerbated by steroids: Followed by [facility]. At home uses Dexcom for monitoring. Has follow up with Endocrinology on 07/05/24.   - Increase insulin  glargine from 12u to 15u once daily  - Continue insulin  aspart with meals per carb counting (1 unit for every 8 grams of carbs)  - Endocrine added Metformin  500 mg XR daily on 07/05/24. Meeting with Endocrinology 2/5 to discuss insulin  pump.     12) Pain:  - PRN tramadol  50 mg. No oxy as this give patient migraines     13) Anxiety:  Lexapro  10 mg daily (has PRN ativan  available but has not needed)  Reconnected with her local therapist      14) DispoCaregivers: Lives in [place], [place]  Primary caregiver: Jeanie Tate (mom)  Secondary caregiver: Koriana Stepien (husband)    15) Bone Health: DEXA 07/09/24: Results with normal bone density    Plan: D+79. Doing very well.   Good engraftment.  1/09 DNA chimerism >95% donor in unfx and 93% donor in CD3+.     Per Dr. Jeaneen plan to check chimerism and BCR-ABL q 4 weeks. WIll obtain next visit (09/03/24).   If remains negative, consider maintenance dasatinib  70 mg or ponatinib  15 mg every day. Relapse occurred on ponatinib , several months after swicth from dasatinib  to ponatinib  and appeared to have been driven by CNS disease. BCR-ABL1 mutation analysis at the time of relapse was negative. Dasatinib  may be better maintenance/prophylaxis in this case.      **No e/o GVHD.   Plan to start tac taper Day 90. Current dose 0.5 mg BID.   Taper schema:   - D+90: decrease dose to 0.5 mg every day.   - D+120: decrease to 0.5 mg every other day  - D+150: consider stopping.         Duwaine Abrahams, PA  [facility]      Future Appointments   Date Time Provider Department Center   09/03/2024  9:15 AM ONCBMT LABS HON[facility]   09/03/2024 10:00 AM ONCBMT APP B [facility]   09/05/2024 10:00 AM Jarold Motto T UNCDIABENDET [facility]   09/06/2024  9:15 AM  ONCBMT LABS HONCBMT [facility]   09/06/2024 10:00 AM ONCBMT APP B [facility]   09/10/2024  9:00 AM ONCBMT LABS [facility]ON[facility]MT [facility]   09/10/2024 10:00 AM ONCBMT APP A [facility]   09/10/2024 11:00 AM Polly Alyce Dover, MD ONCMULTI [facility]   09/10/2024 12:00 PM ONCBMT PROCEDURES [id number] [facility]   09/17/2024  9:15 AM ONCBMT LABS HONCBMT [facility]   09/17/2024 10:00 AM [name], MD HONCBMT [facility]   09/17/2024 11:00 AM CARSER EKG [id number] [facility]   09/24/2024  8:15 AM ONCBMT LABS HONCBMT [facility]   09/24/2024  9:00 AM ONCBMT APP A [facility]   09/24/2024 10:00 AM ONCBMT PHARMACY HONCBMT [facility]   09/24/2024 10:30 AM ONCBMT COORDINATOR HONCBMT [facility]   12/10/2024  9:30 AM ONCBMT LABS HONCBMT [facility]   12/10/2024 10:30 AM ONCBMT PHARMACY HONCBMT [facility]   12/27/2024  9:40 AM Storm Honer, MD UNCDIABENDET [facility]   01/28/2025 12:30 PM ET FL 2 ECHO RM 2 IMGECHOET [place] - ET   01/28/2025  1:25 PM Delores Fleeting, MD UNCHRTVASET [facility]

## 2024-08-30 ENCOUNTER — Ambulatory Visit: Admit: 2024-08-30 | Discharge: 2024-08-31 | Payer: PRIVATE HEALTH INSURANCE

## 2024-08-30 ENCOUNTER — Other Ambulatory Visit: Admit: 2024-08-30 | Discharge: 2024-08-31 | Payer: PRIVATE HEALTH INSURANCE

## 2024-08-30 DIAGNOSIS — Z794 Long term (current) use of insulin: Secondary | ICD-10-CM

## 2024-08-30 DIAGNOSIS — Z9189 Other specified personal risk factors, not elsewhere classified: Secondary | ICD-10-CM

## 2024-08-30 DIAGNOSIS — Z9481 Bone marrow transplant status: Principal | ICD-10-CM

## 2024-08-30 DIAGNOSIS — D84822 Immunocompromised state associated with stem cell transplant    (CMS-HCC): Secondary | ICD-10-CM

## 2024-08-30 DIAGNOSIS — C9100 Acute lymphoblastic leukemia not having achieved remission: Principal | ICD-10-CM

## 2024-08-30 DIAGNOSIS — E1165 Type 2 diabetes mellitus with hyperglycemia: Secondary | ICD-10-CM

## 2024-08-30 DIAGNOSIS — Z5181 Encounter for therapeutic drug level monitoring: Secondary | ICD-10-CM

## 2024-08-30 DIAGNOSIS — R7989 Other specified abnormal findings of blood chemistry: Secondary | ICD-10-CM

## 2024-08-30 DIAGNOSIS — Z8619 Personal history of other infectious and parasitic diseases: Secondary | ICD-10-CM

## 2024-08-30 DIAGNOSIS — Z9484 Stem cells transplant status: Secondary | ICD-10-CM

## 2024-08-30 LAB — CBC W/ AUTO DIFF
BASOPHILS ABSOLUTE COUNT: 0 10*9/L (ref 0.0–0.1)
BASOPHILS RELATIVE PERCENT: 0.7 %
EOSINOPHILS ABSOLUTE COUNT: 0.1 10*9/L (ref 0.0–0.5)
EOSINOPHILS RELATIVE PERCENT: 3.5 %
HEMATOCRIT: 36.5 % (ref 34.0–44.0)
HEMOGLOBIN: 12.2 g/dL (ref 11.3–14.9)
LYMPHOCYTES ABSOLUTE COUNT: 1 10*9/L — ABNORMAL LOW (ref 1.1–3.6)
LYMPHOCYTES RELATIVE PERCENT: 25.3 %
MEAN CORPUSCULAR HEMOGLOBIN CONC: 33.3 g/dL (ref 32.0–36.0)
MEAN CORPUSCULAR HEMOGLOBIN: 32 pg (ref 25.9–32.4)
MEAN CORPUSCULAR VOLUME: 96 fL — ABNORMAL HIGH (ref 77.6–95.7)
MEAN PLATELET VOLUME: 9.1 fL (ref 6.8–10.7)
MONOCYTES ABSOLUTE COUNT: 0.4 10*9/L (ref 0.3–0.8)
MONOCYTES RELATIVE PERCENT: 10.1 %
NEUTROPHILS ABSOLUTE COUNT: 2.4 10*9/L (ref 1.8–7.8)
NEUTROPHILS RELATIVE PERCENT: 60.4 %
PLATELET COUNT: 141 10*9/L — ABNORMAL LOW (ref 150–450)
RED BLOOD CELL COUNT: 3.81 10*12/L — ABNORMAL LOW (ref 3.95–5.13)
RED CELL DISTRIBUTION WIDTH: 14.8 % (ref 12.2–15.2)
WBC ADJUSTED: 3.9 10*9/L (ref 3.6–11.2)

## 2024-08-30 LAB — COMPREHENSIVE METABOLIC PANEL
ALBUMIN: 4.2 g/dL (ref 3.4–5.0)
ALKALINE PHOSPHATASE: 76 U/L (ref 46–116)
ALT (SGPT): 50 U/L — ABNORMAL HIGH (ref 10–49)
ANION GAP: 12 mmol/L (ref 5–14)
AST (SGOT): 32 U/L (ref <=34)
BILIRUBIN TOTAL: 0.3 mg/dL (ref 0.3–1.2)
BLOOD UREA NITROGEN: 13 mg/dL (ref 9–23)
BUN / CREAT RATIO: 20
CALCIUM: 9.7 mg/dL (ref 8.7–10.4)
CHLORIDE: 106 mmol/L (ref 98–107)
CO2: 27 mmol/L (ref 20.0–31.0)
CREATININE: 0.66 mg/dL (ref 0.55–1.02)
EGFR CKD-EPI (2021) FEMALE: >90 mL/min/{1.73_m2} (ref >=60)
GLUCOSE RANDOM: 173 mg/dL (ref 70–179)
POTASSIUM: 4 mmol/L (ref 3.4–4.8)
PROTEIN TOTAL: 6.5 g/dL (ref 5.7–8.2)
SODIUM: 145 mmol/L (ref 135–145)

## 2024-08-30 LAB — TACROLIMUS LEVEL: TACROLIMUS BLOOD: 4.8 ng/mL

## 2024-08-30 LAB — MAGNESIUM: MAGNESIUM: 1.6 mg/dL (ref 1.6–2.6)

## 2024-08-30 NOTE — Progress Notes (Signed)
 [facility]  Immunosuppressive Therapy Note    Patricia [name] is a 49 y.o. 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 [name] is currently day +79.    Concurrent CYP3A4 inhibitors:  - Letermovir   - Fluconazole  (on hold starting 12/13 due to elevated LFTs) restarted 07/22/24 - stopped on 08/26/24 (day +75)    Current dose: 0.5 mg PO BID    Goal tacrolimus  Level: 5-10 ng/mL    Resulted level: 4.8 ng/mL drawn at 10:54 AM, and estimated true trough closer to mid-range    Lab Results   Component Value Date/Time    TACROLIMUS  4.8 08/30/2024 10:54 AM    TACROLIMUS  5.1 08/28/2024 02:12 PM    TACROLIMUS  4.1 08/23/2024 11:11 AM    TACROLIMUS  7.2 08/02/2024 11:33 AM    TACROLIMUS  10.1 06/27/2024 09:37 AM    TACROLIMUS  7.7 06/26/2024 08:21 AM       Lab Results   Component Value Date/Time    CREATININE 0.66 08/30/2024 10:54 AM    CREATININE 0.61 08/28/2024 02:12 PM    CREATININE 0.65 08/23/2024 11:11 AM      A/P  - Tacrolimus  trough remains therapeutic and it is closer to mid-range when accounting for late lab draw time   - SCr WNL and overall stable   - Hepatic function panel now WNL (prior mild AST and ALT elevations)  - Plan to continue tacrolimus  to 0.5 mg PO BID and plan to recheck level and CMP at next clinic visit    We will continue to monitor levels.  Patient will be followed for changes in renal and hepatic function, toxicity, and efficacy.     Elizebeth Has, PharmD, BCOP  Clinical Pharmacist Practitioner, BMTCT

## 2024-08-30 NOTE — Patient Instructions (Signed)
 It was great seeing you today.     No changes for today.     A pharmacist will call you if I need to make changes to your tacrolimus  dose.     -------------------------------------------------------------------------------  Return to clinic on Tuesday to see one of the providers. You will receive a time when you check out today.    Lab Results   Component Value Date    WBC 3.9 08/30/2024    HGB 12.2 08/30/2024    HCT 36.5 08/30/2024    PLT 141 (L) 08/30/2024     Lab Results   Component Value Date    NA 145 08/30/2024    K 4.0 08/30/2024    CL 106 08/30/2024    CO2 27.0 08/30/2024    BUN 13 08/30/2024    CREATININE 0.66 08/30/2024    GLU 173 08/30/2024    CALCIUM  9.7 08/30/2024    MG 1.6 08/30/2024    PHOS 4.5 06/26/2024     Lab Results   Component Value Date    BILITOT 0.3 08/30/2024    BILIDIR 0.10 06/26/2024    PROT 6.5 08/30/2024    ALBUMIN 4.2 08/30/2024    ALT 50 (H) 08/30/2024    AST 32 08/30/2024    ALKPHOS 76 08/30/2024    GGT 836 (H) 11/25/2023     Lab Results   Component Value Date    INR 1.15 06/26/2024    APTT 29.0 06/26/2024       For prescription refills:   For refills, please check your medication bottles to see if you have additional refills left. If so, please call your pharmacy and follow the directions to request a refill. If you do not have any refills left, please make a request during your clinic visit or by submitting a request through MyChart or by calling [phone]. Please allow 24 hours if your request is made during the week or 48 hours if requests are made on the weekends or holidays.     --------------------------------------------------------------------------------------------------------------------  For appointments & questions Monday through Friday 8 AM-4:30 PM     Please call [phone]     On Nights, Weekends and [place] [phone]. This is the nurse's station. They will contact the provider for you.     Please visit Privacyfever.cz, a resource created just for family members and caregivers.  This website lists support services, how and where to ask for help. It has tools to assist you as you help us  care for your loved one.    [facility]  [address]  [place], [place] [address]  [email or web address]

## 2024-09-02 NOTE — Progress Notes (Signed)
 BMT Clinic Follow-up    [name]  MRN: [id number]  Encounter date: 09/03/2024    Referring Physician: Estelle Toribio SAUNDERS, MD  BMT Attending MD: Dr. Jeaneen    Transplant Day: 60     Reason for Visit: Post allogeneic transplant follow up. This visit included intensive monitoring of high-risk medications/disease states given risk of severe myelosuppression, immunosuppression, infection, and other toxicities. Review included CBC, electrolytes and other blood tests, as well as direct assessment of the patient for symptoms suggesting toxicity.    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 Her transplant course was complicated by T2DM with steroid induced hyperglycemia (stable on current regimen at discharge), prolonged Qtc on admission (resolved with last Qtc 460 from 11/20), non-infectious diarrhea (resolved without recent need for imodium ). 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 Received most recent dose of IVIG on 11/26.     On 07/16/24, Patricia [name] was diagnosed with parainfluenza and enrolled on PUL-042.  She received her first nebulizer treatment on 07/19/2024 and overnight developed fever and vomiting.  She withdrew from the trial on Monday, 07/22/2024 when she was seen in clinic.    Interval History  Patricia [name] is a 49 y.o. 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 Patricia [name] is now s/p a matched unrelated donor stem cell transplant.    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    She denies fevers, chills, gastrointestinal symptoms, chest pain, or dyspnea. Eating and drinking well.    Two days prior to presentation, she developed a red, splotchy rash, primarily localized, which improved with topical Aveeno lotion. The rash is now less erythematous with residual textural changes and a few small patches elsewhere. She also notes an increase in freckles despite no recent sun exposure. There is no associated pruritus, pain, or systemic symptoms.     She has a cut in the gluteal cleft initially thought to be a bruise. Her husband identified it as a cut. She applies Vaseline at night and during the day when at home, and recently started bacitracin ointment, which has provided some symptomatic relief.    She experiences intermittent vaginal dryness, currently resolved, and has had no vaginal spotting since menopause following cessation of Lupron  therapy. Sleep is chronically impaired by difficulty initiating sleep, which she attributes to probable ADHD, though mood remains good.    Patricia [name] has an appointment on Thursday with endocrine for follow-up.       ID: Valtrex , Dapsone . Letermovir  (monitoring CMV levels weekly).    Start Tac taper at day +90, ~09/14/24 (see instructions at end of note for taper plan)  Has run out of letermovir  and refills are cost-prohibitive. Awaiting manufacturer assistance.   Mg 2 tabs TID. She has a few days of this left then will switch to Mag Oxide 400mg  BID (cheaper than the mg chelate).   08/28/24: CMV and EBV not detected.    Physical Exam:  BP 120/83  - Pulse 88  - Temp 36.9 ??C (98.4 ??F)  - Resp 18  - Ht 167.6 cm (5' 6)  - Wt 74.5 kg (164 lb 3.9 oz)  - SpO2 95%  - BMI 26.51 kg/m??   Temp:  [36.9 ??C (98.4 ??F)] 36.9 ??C (98.4 ??F)  Pulse:  [88] 88  BP: (120)/(83) 120/83   80,  Normal activity with effort; some signs or symptoms of disease (ECOG equivalent 1)    General: No acute distress noted.   Central venous access: Line clean, dry, intact. No erythema or drainage noted.   ENT: Moist mucous membranes. Oropharhynx without lesions, erythema or exudate.   Cardiovascular: Pulse normal rate, regularity and rhythm. S1 and S2 normal, without any murmur, rub, or gallop.  Lungs: Clear to auscultation bilaterally, without wheezes/crackles/rhonchi. Good air movement.   Skin: Warm, dry, intact. Has some texture changes to arms and left shoulder. Small abrasion to gluteal cleft. No evidence of infection or drainage.   Psychiatry: Alert and oriented to person, place, and time.   Gastrointestinal/Abdomen: Normoactive bowel sounds, abdomen soft, non-tender   Musculoskeletal/Extremities: FROM throughout. No edema  Neurologic: CNII-XII intact. Normal strength and sensation throughout    Acute GVHD Assessment          Most Recent Value    08/09/2024 - 09/03/2024 Allogeneic BMT Unknown Phase - Planned 06/12/2024-Post 1 Year    08/09/2024 13:28 Allogeneic BMT Unknown Phase - Planned 06/12/2024-Post 1 Year    08/16/2024 12:27 Allogeneic BMT Unknown Phase - Planned 06/12/2024-Post 1 Year    08/20/2024 14:42 Allogeneic BMT Unknown Phase - Planned 06/12/2024-Post 1 Year    08/30/2024 12:48   Acute GVHD Grade and Severity   Overall Grade (Przepiorka) 0  08/30/2024 0 0 0 0     Chronic GVHD Assessment          Most Recent Value    08/30/2024 - 09/03/2024 Allogeneic BMT Unknown Phase - Planned 06/12/2024-Post 1 Year    08/30/2024 12:48   Chronic GVHD Grade and Severity   Chronic Graft-Versus-Host Disease Global Severity Score 0  08/30/2024 0   Total Chronic Graft-Versus-Host Disease Score 0  08/30/2024 0     Assessment:  1) SCT Summary:  - Type of Transplant: RIC MUD Allo (Fludarabine  40 mg/m2/Melphalan  100 mg/m2)  - Graft Source: Fresh PBSCs10/10, Female, ABO O-, CMV negative     2) Disease: ALL  - Pre BMT disease status: CR MRD Negative  - Signs/symptoms of disease relapse: None   - Restaging plan:    *Next BMBX: Day +90   *Next Chimerism: Day +90    Date Source Unfractionated CD3   07/12/24 PB >95% 93   08/09/24 PB  >95% 93      3) Immunosuppressed: High risk of infections, if patient develops a fever will admit to inpatient unit for IV antibiotics and fever workup.   - Prophylaxis:          * Antiviral: Valtrex  500 mg po BID through 2 years post transplant. Letermovir  480 mg po daily through day +200 to prevent CMV infections    *1/30, supply runs out 2/1 and current cost is >$2000 a month. Looking into manufacturer assistance as below  * Antifungal: Fluconazole  400 mg po daily through day +75  * PJP: Sulfa allergic. Dapsone  daily upon platelet engraftment, has tolerated this well in the past. Toxo PCR negative.    - Vaccines to begin at 6 months     - Prior infections:  Hx CMV viremia: Dr. 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   Previously required Valcyte  treatment. 11/16 CMV viral load undetectable.  - Continue Letermovir  and Valtrex  BID through day +200 post SCT.   -Letermovir  co-pay with new year now $2000+ a month with co-pay card, looking into if she can get manufacturers assistance. She has 2 day supply left, ok to come off once runs  out and can restart if gets manufacturer assistance.   Will continue weekly monitoring if unanble to get supply will follow closely and pivot to valgancyclovir if she starts to trend up on PCRs    Recurrent Acute Sinusitis  Recurrent acute sinusitis with nasal congestion, frontal headache, and low-grade fever. Responded well to Augmentin .  -06/07/24 current sx resolving, started Augmentin  10 days (10/30-11/9)    Parainfluenza:  -12/16: congestion, cough, sinus pressure. RPP +parainfluenza. Consented to pulmotect study.   - 12/19: 1st nebulizer treatment on Pumotect study, plan for 2nd treatment 12/22 and 3rd treatment 12/23.   - 12/22: Developed fever and N/V after 1st nebulizer. Per patient intolerance to nebulizer she has declined further doses and would like to withdraw from the study. Given increased albuterol  used CXR obtained which was clear. Continue with symptomatic treatment. Starting Agumentin x 10 days for possible secondary sinusitis. Sent screening cultures today due to fever of 101 over the weekend (likely secondary to Paraflu but will rule out bacteremia).   - 12/24: Cough is worsening today so we repeated a chest x-ray, which was clear.  Patricia [name] is not eligible for any further treatment on PUL-042.  Patricia [name] has been feeling better with intermittent albuterol  nebulizers so I prescribed additional nebulizer vials.  07/30/24: Recovering from parainfluenza with residual cough and mild dysphonia.   - Ordered chest x-ray and nasal respiratory swab for Friday visit.  - Now recovered. Continues albuterol  PRN if gets cough from dry air.     Hypogammaglobulinemia   - Monthly IVIG (started 07/26/22 with CMV viremia and recurrent URI's) - last dose on 06/26/24   - Check monthly IgG levels   - IgG 769 on 12/30  - She was receiving this monthly prior to SCT, per ICID keep monitoring monthly and give prn if levels <400.    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   Prophylaxis:  - Methotrexate  5 mg/m2 IVP on days +1, +3, +6 and +11  - Tacrolimus      * Goal trough: 5-10     * High risk drug monitoring and dose adjustments per BMT Pharmacist    * Signs/symptoms of toxicity: None      - 07/30/24: Monitoring for GVHD signs, especially with elevated eosinophils.  - 08/06/24: Eosinophilia resolved.    5) Heme  Transfusion criteria: Transfuse 1 unit of PRBCs for hemoglobin <7 and 1 unit of platelets for Plt <10K or bleeding. [name] Mellette does not have a history of transfusion reactions. Consent was obtained and documented on 06/05/24.     6) GI/Liver:   GERD prophylaxis: Pepcid  BID    Nausea:PRN zofran  and compazine , not currently needing     Diarrhea: PRN Imodium , not currently needing.     Elevated liver enzymes:  - Started trending up on 12/5 with mildly elevated AST  - On 12/9 both AST and ALT elevated- Dapsone  and Pepcid  held  - 12/12: AST 112 and ALT 172 normal bili. Will hold Fluconazole  until next visit to re-evaluate. Sending adeno and HHV-6. CMV and EBV have been negative.   - 12/16: AST improved, ALT stable. Like up from URI (tested positive for parainfluenza today). Possibly secondary medication (metformin , fluc, dapsone , and pepcid ). Fluc, dapsone , and pepcid  on hold. If LFts stable/improved next visit would restart fluconazole .   - 12/19: AST/ALT worsened today, likely secondary to parainfluenza. Repeat labs on Monday  if LFTs stable/imrpoved plan to re-challenge fluc, if worsen will need to setup IV micafungin.   - 12/22: AST/ALT improving thus likely secondary to URI. Will rechallenge with fluconazole .   - 12/24: AST/ALT continue to improve.  I suspect this is resolution after URI.  Can resume dapsone .  --09/03/24: LFts normal today     7) Hypomagnesemia:   Mag chelate 2 tabs TID, this has become too costly and she was able to find Mag Oxide cheaper. Will transition to Mag Oxide 400mg  BID once uses up current supply of Mag Chelate.     8)  Heart failure with mildly reduced ejection fraction (HFmrEF)   Followed by [facility]:  Due to anythracycline use  Hx of EF 45-50%, most recent Echo 05/23/24 improved to 55-60%  Hx of prolonged QTc  - Home med Losartan  50 mg daily (held 11/18)    BP Readings from Last 3 Encounters:   09/03/24 120/83   08/30/24 111/92   08/28/24 132/93      9) Pulm/Allergies   - cetirizine  10 mg daily   - albuterol  PRN     10) Menses suppression: Lupron  last given 07/26/22. No menses sense (peri-menopausal/menopausal)  - 12/19: screening urine pregnancy test per study resulted as positive, repeat urine test indeterminate. Serum beta HcG 5.9. Low level Hcg can often be seen in peri-menopausal women who are not pregnant. She has had chemotherapy and only 1 month post transplant so would consider this a negative pregnancy screen.      11) Type 2 DM exacerbated by steroids: Followed by [facility] End[facility]. At home uses Dexcom for monitoring. Has follow up with Endocrinology on 07/05/24.   - Increase insulin  glargine from 12u to 15u once daily  - Continue insulin  aspart with meals per carb counting (1 unit for every 8 grams of carbs)  - Endocrine added Metformin  500 mg XR daily on 07/05/24. Meeting with Endocrinology 2/5 to discuss insulin  pump.     12) Pain:  - PRN tramadol  50 mg. No oxy as this give patient migraines     13) Anxiety:  Lexapro  10 mg daily (has PRN ativan  available but has not needed)  Reconnected with her local therapist      14) DispoCaregivers: Lives in [place], [place]  Primary caregiver: Jeanie Tate (mom)  Secondary caregiver: Jerie Basford (husband)    15) Bone Health: DEXA 07/09/24: Results with normal bone density    Plan: D+83. Doing well.   Good engraftment.  1/09 DNA chimerism >95% donor in unfx and 93% donor in CD3+.     Per Dr. Jeaneen plan to check chimerism and BCR-ABL q 4 weeks. WIll obtain next visit (09/03/24).   If remains negative, consider maintenance dasatinib  70 mg or ponatinib  15 mg every day. Relapse occurred on ponatinib , several months after swicth from dasatinib  to ponatinib  and appeared to have been driven by CNS disease. BCR-ABL1 mutation analysis at the time of relapse was negative. Dasatinib  may be better maintenance/prophylaxis in this case.      **No e/o GVHD.   Plan to start tac taper Day 90. Current dose 0.5 mg BID.   Taper schema:   - D+90: decrease dose to 0.5 mg every day.   - D+120: decrease to 0.5 mg every other day  - D+150: consider stopping.       Cyann Venti D Trenia Tennyson, ANP  [facility]    I personally spent 45 minutes face-to-face and non-face-to-face in the care  of this patient, which includes all pre, intra, and post visit time on the date of service.  All documented time was specific to the E/M visit and does not include any procedures that may have been performed.  Time was spent on clinical assessment, placing orders / referrals, and/or prescription management, all pertaining to the diagnoses listed above.       Future Appointments   Date Time Provider Department Center   09/05/2024 10:00 AM Jarold Bernardino DASEN UNCDIABENDET [facility]   09/06/2024  9:15 AM ONCBMT LABS HONCBMT [facility]   09/06/2024 10:00 AM ONCBMT APP B [facility]   09/10/2024  9:00 AM ONCBMT LABS HONCBMT [facility]   09/10/2024 10:00 AM ONCBMT APP A [facility]ON[facility]MT [facility]   09/10/2024 11:00 AM Polly Alyce Dover, MD ONCMULTI [facility]   09/10/2024 12:00 PM ONCBMT PROCEDURES [id number] [facility]   09/17/2024  9:15 AM ONCBMT LABS HONCBMT [facility]   09/17/2024 10:00 AM Patricia [name] Dossie Craze, MD HONCBMT [facility]   09/17/2024 11:00 AM CARSER EKG [id number] [facility]   09/24/2024  8:15 AM ONCBMT LABS HONCBMT [facility]   09/24/2024  9:00 AM ONCBMT APP A [facility]ON[facility]MT [facility]   09/24/2024 10:00 AM ONCBMT PHARMACY HONCBMT [facility]   09/24/2024 10:30 AM ONCBMT COORDINATOR HONCBMT [facility]   12/10/2024  9:30 AM ONCBMT LABS HONCBMT [facility]   12/10/2024 10:30 AM ONCBMT PHARMACY HONCBMT [facility]   12/27/2024  9:40 AM Storm Honer, MD UNCDIABENDET [facility]   01/28/2025 12:30 PM ET FL 2 ECHO RM 2 IMGECHOET [place] - ET   01/28/2025  1:25 PM Delores Fleeting, MD UNCHRTVASET [facility]

## 2024-09-03 ENCOUNTER — Ambulatory Visit
Admit: 2024-09-03 | Discharge: 2024-09-04 | Payer: PRIVATE HEALTH INSURANCE | Attending: Nurse Practitioner | Primary: Nurse Practitioner

## 2024-09-03 ENCOUNTER — Other Ambulatory Visit: Admit: 2024-09-03 | Discharge: 2024-09-04 | Payer: PRIVATE HEALTH INSURANCE

## 2024-09-03 DIAGNOSIS — Z9481 Bone marrow transplant status: Principal | ICD-10-CM

## 2024-09-03 DIAGNOSIS — C9100 Acute lymphoblastic leukemia not having achieved remission: Principal | ICD-10-CM

## 2024-09-03 DIAGNOSIS — Z9484 Stem cells transplant status: Principal | ICD-10-CM

## 2024-09-03 LAB — COMPREHENSIVE METABOLIC PANEL
ALBUMIN: 4 g/dL (ref 3.4–5.0)
ALKALINE PHOSPHATASE: 77 U/L (ref 46–116)
ALT (SGPT): 49 U/L (ref 10–49)
ANION GAP: 13 mmol/L (ref 5–14)
AST (SGOT): 32 U/L (ref <=34)
BILIRUBIN TOTAL: 0.3 mg/dL (ref 0.3–1.2)
BLOOD UREA NITROGEN: 13 mg/dL (ref 9–23)
BUN / CREAT RATIO: 20
CALCIUM: 9.5 mg/dL (ref 8.7–10.4)
CHLORIDE: 102 mmol/L (ref 98–107)
CO2: 26 mmol/L (ref 20.0–31.0)
CREATININE: 0.64 mg/dL (ref 0.55–1.02)
EGFR CKD-EPI (2021) FEMALE: >90 mL/min/{1.73_m2} (ref >=60)
GLUCOSE RANDOM: 276 mg/dL — ABNORMAL HIGH (ref 70–179)
POTASSIUM: 4.5 mmol/L (ref 3.4–4.8)
PROTEIN TOTAL: 6.2 g/dL (ref 5.7–8.2)
SODIUM: 141 mmol/L (ref 135–145)

## 2024-09-03 LAB — CBC W/ AUTO DIFF
BASOPHILS ABSOLUTE COUNT: 0 10*9/L (ref 0.0–0.1)
BASOPHILS RELATIVE PERCENT: 0.7 %
EOSINOPHILS ABSOLUTE COUNT: 0.1 10*9/L (ref 0.0–0.5)
EOSINOPHILS RELATIVE PERCENT: 2.6 %
HEMATOCRIT: 37.4 % (ref 34.0–44.0)
HEMOGLOBIN: 12.5 g/dL (ref 11.3–14.9)
LYMPHOCYTES ABSOLUTE COUNT: 0.9 10*9/L — ABNORMAL LOW (ref 1.1–3.6)
LYMPHOCYTES RELATIVE PERCENT: 23 %
MEAN CORPUSCULAR HEMOGLOBIN CONC: 33.6 g/dL (ref 32.0–36.0)
MEAN CORPUSCULAR HEMOGLOBIN: 32.6 pg — ABNORMAL HIGH (ref 25.9–32.4)
MEAN CORPUSCULAR VOLUME: 97.1 fL — ABNORMAL HIGH (ref 77.6–95.7)
MEAN PLATELET VOLUME: 8.9 fL (ref 6.8–10.7)
MONOCYTES ABSOLUTE COUNT: 0.4 10*9/L (ref 0.3–0.8)
MONOCYTES RELATIVE PERCENT: 9.5 %
NEUTROPHILS ABSOLUTE COUNT: 2.6 10*9/L (ref 1.8–7.8)
NEUTROPHILS RELATIVE PERCENT: 64.2 %
PLATELET COUNT: 145 10*9/L — ABNORMAL LOW (ref 150–450)
RED BLOOD CELL COUNT: 3.85 10*12/L — ABNORMAL LOW (ref 3.95–5.13)
RED CELL DISTRIBUTION WIDTH: 14.4 % (ref 12.2–15.2)
WBC ADJUSTED: 4 10*9/L (ref 3.6–11.2)

## 2024-09-03 LAB — TACROLIMUS LEVEL: TACROLIMUS BLOOD: 2.8 ng/mL

## 2024-09-03 LAB — MAGNESIUM: MAGNESIUM: 1.7 mg/dL (ref 1.6–2.6)

## 2024-09-03 LAB — CMV DNA, QUANTITATIVE, PCR: CMV VIRAL LD: NOT DETECTED

## 2024-09-03 LAB — EBV QUANTITATIVE PCR, BLOOD: EBV VIRAL LOAD RESULT: NOT DETECTED

## 2024-09-03 LAB — IGG: GAMMAGLOBULIN; IGG: 457 mg/dL — ABNORMAL LOW (ref 650–1600)

## 2024-09-03 MED ORDER — TACROLIMUS 0.5 MG CAPSULE, IMMEDIATE-RELEASE
ORAL_CAPSULE | ORAL | 5 refills | 30.00000 days | Status: CP
Start: 2024-09-03 — End: ?

## 2024-09-03 NOTE — Telephone Encounter (Signed)
 [facility]  Immunosuppressive Therapy Note    Patricia [name] is a 49 y.o. 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 [name] is currently day +83.    Concurrent CYP3A4 inhibitors: none  - Letermovir  supply has run out and refill >$2000 with the new year (last dose 08/31/24)  - Fluconazole  (on hold starting 12/13 due to elevated LFTs) restarted 07/22/24 - stopped on 08/26/24 (day +75)    Current dose: 0.5 mg PO BID    Goal tacrolimus  Level: 5-10 ng/mL    Resulted level: 2.8 ng/mL drawn at 12:11 PM, true trough likely slightly subtherapeutic    Lab Results   Component Value Date/Time    TACROLIMUS  2.8 09/03/2024 12:11 PM    TACROLIMUS  4.8 08/30/2024 10:54 AM    TACROLIMUS  5.1 08/28/2024 02:12 PM    TACROLIMUS  7.2 08/02/2024 11:33 AM    TACROLIMUS  10.1 06/27/2024 09:37 AM    TACROLIMUS  7.7 06/26/2024 08:21 AM       Lab Results   Component Value Date/Time    CREATININE 0.64 09/03/2024 12:11 PM    CREATININE 0.66 08/30/2024 10:54 AM    CREATININE 0.61 08/28/2024 02:12 PM      A/P  - Tacrolimus  trough slightly decreased from prior and likely slightly subtherapeutic when accounting for late lab draw time  - Possibly related to recently discontinuing letermovir   - SCr WNL and overall stable   - Hepatic function panel now WNL (prior mild AST and ALT elevations)  - Plan to increase tacrolimus  dose to 0.5 mg PO qAM and 1 mg qPM and plan to recheck level and CMP at next clinic visit  - Discussed with Ms. Mealor who verbalized understanding    We will continue to monitor levels.  Patient will be followed for changes in renal and hepatic function, toxicity, and efficacy.     [name], PharmD, BCPS, BCOP, CPP  [facility]

## 2024-09-04 DIAGNOSIS — C9100 Acute lymphoblastic leukemia not having achieved remission: Principal | ICD-10-CM

## 2024-09-05 ENCOUNTER — Ambulatory Visit: Admit: 2024-09-05 | Payer: PRIVATE HEALTH INSURANCE

## 2024-09-05 ENCOUNTER — Other Ambulatory Visit: Admit: 2024-09-05 | Payer: PRIVATE HEALTH INSURANCE

## 2024-09-05 ENCOUNTER — Ambulatory Visit: Admit: 2024-09-05 | Payer: PRIVATE HEALTH INSURANCE | Attending: Nurse Practitioner | Primary: Nurse Practitioner

## 2024-09-05 DIAGNOSIS — C9100 Acute lymphoblastic leukemia not having achieved remission: Secondary | ICD-10-CM

## 2024-09-05 DIAGNOSIS — Z9481 Bone marrow transplant status: Principal | ICD-10-CM

## 2024-09-05 DIAGNOSIS — D84822 Immunocompromised state associated with stem cell transplant    (CMS-HCC): Secondary | ICD-10-CM

## 2024-09-05 DIAGNOSIS — Z9484 Stem cells transplant status: Principal | ICD-10-CM

## 2024-09-05 DIAGNOSIS — Z79899 Other long term (current) drug therapy: Secondary | ICD-10-CM

## 2024-09-05 LAB — CBC W/ AUTO DIFF
BASOPHILS ABSOLUTE COUNT: 0 10*9/L (ref 0.0–0.1)
BASOPHILS RELATIVE PERCENT: 0.9 %
EOSINOPHILS ABSOLUTE COUNT: 0.1 10*9/L (ref 0.0–0.5)
EOSINOPHILS RELATIVE PERCENT: 2.6 %
HEMATOCRIT: 37.6 % (ref 34.0–44.0)
HEMOGLOBIN: 12.4 g/dL (ref 11.3–14.9)
LYMPHOCYTES ABSOLUTE COUNT: 1 10*9/L — ABNORMAL LOW (ref 1.1–3.6)
LYMPHOCYTES RELATIVE PERCENT: 24.2 %
MEAN CORPUSCULAR HEMOGLOBIN CONC: 33 g/dL (ref 32.0–36.0)
MEAN CORPUSCULAR HEMOGLOBIN: 31.4 pg (ref 25.9–32.4)
MEAN CORPUSCULAR VOLUME: 95.3 fL (ref 77.6–95.7)
MEAN PLATELET VOLUME: 9.2 fL (ref 6.8–10.7)
MONOCYTES ABSOLUTE COUNT: 0.5 10*9/L (ref 0.3–0.8)
MONOCYTES RELATIVE PERCENT: 11.5 %
NEUTROPHILS ABSOLUTE COUNT: 2.5 10*9/L (ref 1.8–7.8)
NEUTROPHILS RELATIVE PERCENT: 60.8 %
PLATELET COUNT: 138 10*9/L — ABNORMAL LOW (ref 150–450)
RED BLOOD CELL COUNT: 3.94 10*12/L — ABNORMAL LOW (ref 3.95–5.13)
RED CELL DISTRIBUTION WIDTH: 14.1 % (ref 12.2–15.2)
WBC ADJUSTED: 4.2 10*9/L (ref 3.6–11.2)

## 2024-09-05 LAB — COMPREHENSIVE METABOLIC PANEL
ALBUMIN: 4.2 g/dL (ref 3.4–5.0)
ALKALINE PHOSPHATASE: 74 U/L (ref 46–116)
ALT (SGPT): 47 U/L (ref 10–49)
ANION GAP: 13 mmol/L (ref 5–14)
AST (SGOT): 32 U/L (ref <=34)
BILIRUBIN TOTAL: 0.3 mg/dL (ref 0.3–1.2)
BLOOD UREA NITROGEN: 16 mg/dL (ref 9–23)
BUN / CREAT RATIO: 24
CALCIUM: 9.7 mg/dL (ref 8.7–10.4)
CHLORIDE: 103 mmol/L (ref 98–107)
CO2: 25 mmol/L (ref 20.0–31.0)
CREATININE: 0.66 mg/dL (ref 0.55–1.02)
EGFR CKD-EPI (2021) FEMALE: >90 mL/min/{1.73_m2} (ref >=60)
GLUCOSE RANDOM: 165 mg/dL (ref 70–179)
POTASSIUM: 4.3 mmol/L (ref 3.4–4.8)
PROTEIN TOTAL: 6.6 g/dL (ref 5.7–8.2)
SODIUM: 141 mmol/L (ref 135–145)

## 2024-09-05 LAB — IGG: GAMMAGLOBULIN; IGG: 491 mg/dL — ABNORMAL LOW (ref 650–1600)

## 2024-09-05 LAB — MAGNESIUM: MAGNESIUM: 1.9 mg/dL (ref 1.6–2.6)

## 2024-09-05 LAB — TACROLIMUS LEVEL: TACROLIMUS BLOOD: 1.7 ng/mL

## 2024-09-05 MED ORDER — VALACYCLOVIR 500 MG TABLET
ORAL_TABLET | Freq: Two times a day (BID) | ORAL | 11 refills | 30.00000 days | Status: CP
Start: 2024-09-05 — End: ?

## 2024-09-05 MED ADMIN — methotrexate (PRESERVATIVE FREE) 12 mg, hydrocortisone sod succ (Solu-CORTEF) 50 mg in sodium chloride (NS) 0.9 % 6 mL INTRATHECAL syringe: INTRATHECAL | @ 16:00:00 | Stop: 2024-09-05

## 2024-09-05 MED ADMIN — heparin, porcine (PF) 100 unit/mL injection 500 Units: 500 [IU] | INTRAVENOUS | @ 18:00:00 | Stop: 2024-09-06

## 2024-09-05 MED ADMIN — midazolam (VERSED) injection 3 mg: 3 mg | INTRAVENOUS | @ 16:00:00 | Stop: 2024-09-05

## 2024-09-05 MED ADMIN — traMADol (ULTRAM) tablet 50 mg: 50 mg | ORAL | @ 16:00:00 | Stop: 2024-09-05

## 2024-09-05 NOTE — Progress Notes (Unsigned)
 Patient arrived in bed 9 in infusion with port accessed PTA, positive for brisk blood return. Patient underwent lumbar puncture procedure and received IT chemo from Rhoda Goon, NP per treatment plan. Patient tolerated well, port positive for brisk blood return prior to being heparin  locked and deaccessed.AVS declined and patient in stable condition upon discharge with family member.

## 2024-09-05 NOTE — Progress Notes (Unsigned)
 Outpatient BMT SW Note: SW received a message from [place] with the [employer] program. She states, I wanted to give you a status update on the grant for this patient (RID [id number]). The grant was approved for $1,000 and payment preference was a pre-paid card.     I did some digging with our finance team and this prepaid card approval got stuck in the approval system. I did a manual push and it's now moving through the system. The prepaid card will be mailed via USPS on Monday or Tuesday. I will follow up next week with the tracking number.    SW called pt and provided her with an update.    Pt was appreciative.    SW will follow up.

## 2024-09-05 NOTE — Telephone Encounter (Signed)
 Opened in error

## 2024-09-05 NOTE — Progress Notes (Signed)
 BMT Clinic Follow-up    [name]  MRN: [id number]  Encounter date: 09/05/2024    Referring Physician: Estelle Toribio SAUNDERS, MD  BMT Attending MD: Dr. Jeaneen    Transplant Day: 33     Reason for Visit: Post allogeneic transplant follow up. This visit included intensive monitoring of high-risk medications/disease states given risk of severe myelosuppression, immunosuppression, infection, and other toxicities. Review included CBC, electrolytes and other blood tests, as well as direct assessment of the patient for symptoms suggesting toxicity.    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 Her transplant course was complicated by T2DM with steroid induced hyperglycemia (stable on current regimen at discharge), prolonged Qtc on admission (resolved with last Qtc 460 from 11/20), non-infectious diarrhea (resolved without recent need for imodium ). 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 Received most recent dose of IVIG on 11/26.     On 07/16/24, Patricia [name] was diagnosed with parainfluenza and enrolled on PUL-042.  She received her first nebulizer treatment on 07/19/2024 and overnight developed fever and vomiting.  She withdrew from the trial on Monday, 07/22/2024 when she was seen in clinic.    Interval History  Patricia [name] is a 49 y.o. 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 Venia is now s/p a matched unrelated donor stem cell transplant.    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 She is here with her best friend.    She denies fever, chills, chest pain, dyspnea, or gastrointestinal symptoms. Nausea occurred transiently after receiving laboratory results of +BCR/ABL but resolved spontaneously. She has not experienced infectious symptoms and her temperature has remained normal.    Blood glucose, previously elevated, is currently improved with a recent value of 165 mg/dL. She missed a scheduled insulin  pump appointment due to LP today. Will get this rescheduled.     She describes intermittent headaches, most recently following wine consumption and significant stress.. The headache has now resolved.    She continues topical treatment for a mild abrasion in the gluteal region, avoiding daytime application due to discomfort. No new dermatologic issues reported.    She endorses insomnia, characterized by inability to quiet her mind and frequent nocturnal restlessness. Last night, she took ativan  for the first time since her recent hospitalization, which was effective and did not cause morning somnolence. She expresses ongoing stress related to her diagnosis and recent events but denies other acute concerns.    ID: Valtrex , Dapsone . Letermovir  (monitoring CMV levels weekly).    Start Tac taper at day +90, ~09/14/24 (see instructions at end of note for taper plan)  Has run out of letermovir  and refills are cost-prohibitive. Awaiting manufacturer assistance.   Mg 2 tabs TID. She has a few days of this left then will switch to Mag Oxide 400mg  BID (cheaper than the mg chelate).   08/28/24: CMV and EBV not detected.    Physical Exam:  Wt Readings from Last 3 Encounters:   09/05/24 73.3 kg (161 lb 11.2 oz)   09/03/24 74.5 kg (164 lb 3.9 oz)   08/30/24 73.9 kg (162 lb 14.7 oz)     Temp Readings from Last 3 Encounters:   09/05/24 36.9 ??C (98.4 ??F) (Oral)   09/03/24 36.9 ??C (98.4 ??F)   08/30/24 36.7 ??C (98.1 ??F) (Temporal)     BP Readings from  Last 3 Encounters:   09/05/24 123/90   09/03/24 120/83   08/30/24 111/92     Pulse Readings from Last 3 Encounters:   09/05/24 96   09/03/24 88   08/30/24 84     80, Normal activity with effort; some signs or symptoms of disease (ECOG equivalent 1)    General: No acute distress noted.   Central venous access: Line clean, dry, intact. No erythema or drainage noted.   ENT: Moist mucous membranes. Oropharhynx without lesions, erythema or exudate.   Cardiovascular: Pulse normal rate, regularity and rhythm. S1 and S2 normal, without any murmur, rub, or gallop.  Lungs: Clear to auscultation bilaterally, without wheezes/crackles/rhonchi. Good air movement.   Skin: Warm, dry, intact. Improved texture changes to arms and left shoulder. Small abrasion to gluteal cleft-not visualized today.   Psychiatry: Alert and oriented to person, place, and time.   Gastrointestinal/Abdomen: Normoactive bowel sounds, abdomen soft, non-tender   Musculoskeletal/Extremities: FROM throughout. No edema  Neurologic: CNII-XII intact. Normal strength and sensation throughout    Acute GVHD Assessment          Most Recent Value    08/09/2024 - 09/05/2024 Allogeneic BMT Unknown Phase - Planned 06/12/2024-Post 1 Year    08/09/2024 13:28 Allogeneic BMT Unknown Phase - Planned 06/12/2024-Post 1 Year    08/16/2024 12:27 Allogeneic BMT Unknown Phase - Planned 06/12/2024-Post 1 Year    08/20/2024 14:42 Allogeneic BMT Unknown Phase - Planned 06/12/2024-Post 1 Year    08/30/2024 12:48   Acute GVHD Grade and Severity   Overall Grade (Przepiorka) 0  08/30/2024 0 0 0 0     Chronic GVHD Assessment          Most Recent Value    08/30/2024 - 09/05/2024 Allogeneic BMT Unknown Phase - Planned 06/12/2024-Post 1 Year    08/30/2024 12:48 Allogeneic BMT Unknown Phase - Planned 06/12/2024-Post 1 Year    09/05/2024 16:49   Chronic GVHD Grade and Severity   Chronic Graft-Versus-Host Disease Global Severity Score 0  08/30/2024 0    Total Chronic Graft-Versus-Host Disease Score 0  09/05/2024 0 0     Assessment:  1) SCT Summary:  - Type of Transplant: RIC MUD Allo (Fludarabine  40 mg/m2/Melphalan  100 mg/m2)  - Graft Source: Fresh PBSCs10/10, Female, ABO O-, CMV negative     2) Disease: ALL  - Pre BMT disease status: CR MRD Negative  - Signs/symptoms of disease relapse: +BCR/ABL p190 in PB on 1/28  - Restaging plan:    *Next BMBX: Day +90   *Next Chimerism: Day +90    Date Source Unfractionated CD3   07/12/24 PB >95% 93   08/09/24 PB  >95% 93      -09/05/24: Most recent p190 from PB + at 77 copies. LP performed today with results pending. She was already scheduled for day 90 biopsy next week. If shows relapse, will need follow up with Dr. Estelle.     3) Immunosuppressed: High risk of infections, if patient develops a fever will admit to inpatient unit for IV antibiotics and fever workup.   - Prophylaxis:          * Antiviral: Valtrex  500 mg po BID through 2 years post transplant.   Letermovir  480 mg po daily through day +200 to prevent CMV infections    *1/30, supply runs out 2/1 and current cost is >$2000 a month. Looking into manufacturer assistance as below  * Antifungal: Fluconazole  400 mg po daily through day +75  * PJP: Sulfa  allergic. Dapsone  daily upon platelet engraftment, has tolerated this well in the past. Toxo PCR negative.    - Vaccines to begin at 6 months     - Prior infections:  Hx CMV viremia: Dr. 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   Previously required Valcyte  treatment. 11/16 CMV viral load undetectable.  - Continue Letermovir  and Valtrex  BID through day +200 post SCT.   -Letermovir  co-pay with new year now $2000+ a month with co-pay card, looking into if she can get manufacturers assistance. She has 2 day supply left, ok to come off once runs out and can restart if gets manufacturer assistance.   Will continue weekly monitoring if unanble to get supply will follow closely and pivot to valgancyclovir if she starts to trend up on PCRs    Recurrent Acute Sinusitis  Recurrent acute sinusitis with nasal congestion, frontal headache, and low-grade fever. Responded well to Augmentin .  -06/07/24 current sx resolving, started Augmentin  10 days (10/30-11/9)    Parainfluenza:  -12/16: congestion, cough, sinus pressure. RPP +parainfluenza. Consented to pulmotect study.   - 12/19: 1st nebulizer treatment on Pumotect study, plan for 2nd treatment 12/22 and 3rd treatment 12/23.   - 12/22: Developed fever and N/V after 1st nebulizer. Per patient intolerance to nebulizer she has declined further doses and would like to withdraw from the study. Given increased albuterol  used CXR obtained which was clear. Continue with symptomatic treatment. Starting Agumentin x 10 days for possible secondary sinusitis. Sent screening cultures today due to fever of 101 over the weekend (likely secondary to Paraflu but will rule out bacteremia).   - 12/24: Cough is worsening today so we repeated a chest x-ray, which was clear.  Patricia [name] is not eligible for any further treatment on PUL-042.  Patricia [name] has been feeling better with intermittent albuterol  nebulizers so I prescribed additional nebulizer vials.  07/30/24: Recovering from parainfluenza with residual cough and mild dysphonia.   - Ordered chest x-ray and nasal respiratory swab for Friday visit.  - Now recovered. Continues albuterol  PRN if gets cough from dry air.     Hypogammaglobulinemia   - Monthly IVIG (started 07/26/22 with CMV viremia and recurrent URI's) - last dose on 06/26/24   - Check monthly IgG levels   - IgG 769 on 12/30  - She was receiving this monthly prior to SCT, per ICID keep monitoring monthly and give prn if levels <400.    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   Prophylaxis:  - Methotrexate  5 mg/m2 IVP on days +1, +3, +6 and +11  - Tacrolimus      * Goal trough: 5-10     * High risk drug monitoring and dose adjustments per BMT Pharmacist    * Signs/symptoms of toxicity: None      - 07/30/24: Monitoring for GVHD signs, especially with elevated eosinophils.  - 08/06/24: Eosinophilia resolved.  -09/05/24: No e/o GVHD. Tac level low today however, plan was to start taper next week anyway. Given +BCR/ABL, will not act on subtherapeutic level today.    5) Heme  Transfusion criteria: Transfuse 1 unit of PRBCs for hemoglobin <7 and 1 unit of platelets for Plt <10K or bleeding. Patricia [name] does not have a history of transfusion reactions. Consent was obtained and documented on 06/05/24.     6) GI/Liver:   GERD prophylaxis: Pepcid  BID    Nausea:PRN zofran  and compazine ,  not currently needing     Diarrhea: PRN Imodium , not currently needing.     Elevated liver enzymes:  - Started trending up on 12/5 with mildly elevated AST  - On 12/9 both AST and ALT elevated- Dapsone  and Pepcid  held  - 12/12: AST 112 and ALT 172 normal bili. Will hold Fluconazole  until next visit to re-evaluate. Sending adeno and HHV-6. CMV and EBV have been negative.   - 12/16: AST improved, ALT stable. Like up from URI (tested positive for parainfluenza today). Possibly secondary medication (metformin , fluc, dapsone , and pepcid ). Fluc, dapsone , and pepcid  on hold. If LFts stable/improved next visit would restart fluconazole .   - 12/19: AST/ALT worsened today, likely secondary to parainfluenza. Repeat labs on Monday if LFTs stable/imrpoved plan to re-challenge fluc, if worsen will need to setup IV micafungin.   - 12/22: AST/ALT improving thus likely secondary to URI. Will rechallenge with fluconazole .   - 12/24: AST/ALT continue to improve.  I suspect this is resolution after URI.  Can resume dapsone .  --09/03/24: LFts normal today     7) Hypomagnesemia:   Mag chelate 2 tabs TID, this has become too costly and she was able to find Mag Oxide cheaper. Will transition to Mag Oxide 400mg  BID once uses up current supply of Mag Chelate.     8)  Heart failure with mildly reduced ejection fraction (HFmrEF)   Followed by [facility]:  Due to anythracycline use  Hx of EF 45-50%, most recent Echo 05/23/24 improved to 55-60%  Hx of prolonged QTc  - Home med Losartan  50 mg daily (held 11/18)    BP Readings from Last 3 Encounters:   09/05/24 123/90   09/03/24 120/83   08/30/24 111/92      9) Pulm/Allergies   - cetirizine  10 mg daily   - albuterol  PRN     10) Menses suppression: Lupron  last given 07/26/22. No menses sense (peri-menopausal/menopausal)  - 12/19: screening urine pregnancy test per study resulted as positive, repeat urine test indeterminate. Serum beta HcG 5.9. Low level Hcg can often be seen in peri-menopausal women who are not pregnant. She has had chemotherapy and only 1 month post transplant so would consider this a negative pregnancy screen.      11) Type 2 DM exacerbated by steroids: Followed by [facility]. At home uses Dexcom for monitoring. Has follow up with Endocrinology on 07/05/24.   - Increase insulin  glargine from 12u to 15u once daily  - Continue insulin  aspart with meals per carb counting (1 unit for every 8 grams of carbs)  - Endocrine added Metformin  500 mg XR daily on 07/05/24. Meeting with Endocrinology 2/5 to discuss insulin  pump.   -09/05/24: Had to reschedule endocrine appt today as LP with chemo was prioritized.     12) Pain:  - PRN tramadol  50 mg. No oxy as this give patient migraines     13) Anxiety:  Lexapro  10 mg daily (has PRN ativan  available but has not needed)  Reconnected with her local therapist      14) DispoCaregivers: Lives in [place], [place]  Primary caregiver: Jeanie Tate (mom)  Secondary caregiver: Rudy Domek (husband)    15) Bone Health: DEXA 07/09/24: Results with normal bone density    Plan: D+85.   Good engraftment.  1/09 DNA chimerism >95% donor in unfx and 93% donor in CD3+.     Per Dr. Jeaneen plan to check chimerism and BCR-ABL q 4 weeks. WIll obtain next visit (09/03/24).  If remains negative, consider maintenance dasatinib  70 mg or ponatinib  15 mg every day. Relapse occurred on ponatinib , several months after swicth from dasatinib  to ponatinib  and appeared to have been driven by CNS disease. BCR-ABL1 mutation analysis at the time of relapse was negative. Dasatinib  may be better maintenance/prophylaxis in this case.    -09/05/24: +PB BCR/ABL as above. Awaiting LP and biopsy results. Also awaiting insurance auth for dasatinib . **No e/o GVHD.   Plan to start tac taper Day 90. Current dose 0.5 mg BID.   Taper schema:   - D+90: decrease dose to 0.5 mg every day.   - D+120: decrease to 0.5 mg every other day  - D+150: consider stopping.       Patricia [name], ANP  [facility]    I personally spent 45 minutes face-to-face and non-face-to-face in the care of this patient, which includes all pre, intra, and post visit time on the date of service.  All documented time was specific to the E/M visit and does not include any procedures that may have been performed.  Time was spent on clinical assessment, placing orders / referrals, and/or prescription management, all pertaining to the diagnoses listed above.       Future Appointments   Date Time Provider Department Center   09/10/2024  9:00 AM ONCBMT LABS [facility]   09/10/2024 10:00 AM ONCBMT APP A [facility]   09/10/2024 11:00 AM Polly Alyce Dover, MD ONCMULTI [facility]   09/10/2024 12:00 PM ONCBMT PROCEDURES [id number] [facility]   09/17/2024  9:15 AM ONCBMT LABS [facility]   09/17/2024 10:00 AM [name], MD HONCBMT [facility]   09/17/2024 11:00 AM CARSER EKG [id number] [facility]   09/24/2024  8:15 AM ONCBMT LABS [facility]ON[facility]MT [facility]   09/24/2024  9:00 AM ONCBMT APP A [facility]   09/24/2024 10:00 AM ONCBMT PHARMACY HON[facility]   09/24/2024 10:30 AM ONCBMT COORDINATOR HON[facility]MT [facility]   12/10/2024  9:30 AM ONCBMT LABS HONCBMT [facility]   12/10/2024 10:30 AM ONCBMT PHARMACY HONCBMT [facility]   12/27/2024  9:40 AM Storm Honer, MD UNCDIABENDET [facility]   01/28/2025 12:30 PM ET FL 2 ECHO RM 2 IMGECHOET [place] - ET   01/28/2025  1:25 PM Delores Fleeting, MD UNCHRTVASET [facility]

## 2024-09-06 LAB — HEMATOPATHOLOGY LEUKEMIA/LYMPHOMA FLOW CYTOMETRY, CSF
LYMPHS CSF: 94.3 %
MONO/MACROPHAGE CSF: 5.7 %
NUCLEATED CELLS, CSF: 5 ul (ref <=5)
NUMBER OF CELLS CSF: 35
RBC CSF: 111 ul — ABNORMAL HIGH (ref <2)

## 2024-09-06 LAB — SPINAL FLUID, PATH REVIEW

## 2024-09-06 NOTE — Patient Instructions (Signed)
 I will be on the lookout for results of your LP.   You will also get a bone marrow biopsy on Tuesday.    A pharmacist will call you if I need to make changes to your tacrolimus  dose.     -------------------------------------------------------------------------------    Lab Results   Component Value Date    WBC 4.2 09/05/2024    HGB 12.4 09/05/2024    HCT 37.6 09/05/2024    PLT 138 (L) 09/05/2024     Lab Results   Component Value Date    NA 141 09/05/2024    K 4.3 09/05/2024    CL 103 09/05/2024    CO2 25.0 09/05/2024    BUN 16 09/05/2024    CREATININE 0.66 09/05/2024    GLU 165 09/05/2024    CALCIUM  9.7 09/05/2024    MG 1.9 09/05/2024    PHOS 4.5 06/26/2024     Lab Results   Component Value Date    BILITOT 0.3 09/05/2024    BILIDIR 0.10 06/26/2024    PROT 6.6 09/05/2024    ALBUMIN 4.2 09/05/2024    ALT 47 09/05/2024    AST 32 09/05/2024    ALKPHOS 74 09/05/2024    GGT 836 (H) 11/25/2023     Lab Results   Component Value Date    INR 1.15 06/26/2024    APTT 29.0 06/26/2024       For prescription refills:   For refills, please check your medication bottles to see if you have additional refills left. If so, please call your pharmacy and follow the directions to request a refill. If you do not have any refills left, please make a request during your clinic visit or by submitting a request through MyChart or by calling [phone]. Please allow 24 hours if your request is made during the week or 48 hours if requests are made on the weekends or holidays.     --------------------------------------------------------------------------------------------------------------------  For appointments & questions Monday through Friday 8 AM-4:30 PM     Please call [phone]     On Nights, Weekends and [place] [phone]. This is the nurse's station. They will contact the provider for you.     Please visit Privacyfe[employer]cz, a resource created just for family members and caregivers.  This website lists support services, how and where to ask for help. It has tools to assist you as you help us  care for your loved one.    [facility]  [address]  [place], [place] [address]  [email or web address]
